# Patient Record
Sex: Female | Born: 1960 | Race: Black or African American | Hispanic: No | Marital: Single | Smoking: Current some day smoker
Health system: Southern US, Community
[De-identification: ages and names within clinical notes are randomized; demographics above are authoritative.]

## PROBLEM LIST (undated history)

## (undated) DIAGNOSIS — R0609 Other forms of dyspnea: Secondary | ICD-10-CM

## (undated) DIAGNOSIS — R0600 Dyspnea, unspecified: Secondary | ICD-10-CM

## (undated) DIAGNOSIS — I639 Cerebral infarction, unspecified: Secondary | ICD-10-CM

## (undated) DIAGNOSIS — J45909 Unspecified asthma, uncomplicated: Secondary | ICD-10-CM

## (undated) DIAGNOSIS — I10 Essential (primary) hypertension: Secondary | ICD-10-CM

## (undated) DIAGNOSIS — I2699 Other pulmonary embolism without acute cor pulmonale: Secondary | ICD-10-CM

## (undated) DIAGNOSIS — R600 Localized edema: Secondary | ICD-10-CM

## (undated) DIAGNOSIS — I2510 Atherosclerotic heart disease of native coronary artery without angina pectoris: Secondary | ICD-10-CM

## (undated) DIAGNOSIS — R569 Unspecified convulsions: Secondary | ICD-10-CM

## (undated) DIAGNOSIS — K760 Fatty (change of) liver, not elsewhere classified: Secondary | ICD-10-CM

## (undated) DIAGNOSIS — J9690 Respiratory failure, unspecified, unspecified whether with hypoxia or hypercapnia: Secondary | ICD-10-CM

## (undated) DIAGNOSIS — D509 Iron deficiency anemia, unspecified: Secondary | ICD-10-CM

## (undated) DIAGNOSIS — I5042 Chronic combined systolic (congestive) and diastolic (congestive) heart failure: Secondary | ICD-10-CM

## (undated) DIAGNOSIS — E119 Type 2 diabetes mellitus without complications: Secondary | ICD-10-CM

## (undated) HISTORY — PX: CORONARY ANGIOPLASTY WITH STENT PLACEMENT: SHX49

---
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
## 2012-05-31 DIAGNOSIS — I2699 Other pulmonary embolism without acute cor pulmonale: Secondary | ICD-10-CM

## 2012-05-31 HISTORY — DX: Other pulmonary embolism without acute cor pulmonale: I26.99

## 2012-10-15 DIAGNOSIS — I10 Essential (primary) hypertension: Secondary | ICD-10-CM | POA: Insufficient documentation

## 2012-10-15 DIAGNOSIS — R600 Localized edema: Secondary | ICD-10-CM | POA: Insufficient documentation

## 2012-10-15 DIAGNOSIS — J449 Chronic obstructive pulmonary disease, unspecified: Secondary | ICD-10-CM | POA: Insufficient documentation

## 2012-10-15 DIAGNOSIS — IMO0001 Reserved for inherently not codable concepts without codable children: Secondary | ICD-10-CM | POA: Insufficient documentation

## 2012-10-15 DIAGNOSIS — I2699 Other pulmonary embolism without acute cor pulmonale: Secondary | ICD-10-CM | POA: Insufficient documentation

## 2012-11-27 ENCOUNTER — Observation Stay (HOSPITAL_COMMUNITY): Payer: Medicaid Other

## 2012-11-27 ENCOUNTER — Emergency Department (HOSPITAL_COMMUNITY): Payer: Medicaid Other

## 2012-11-27 ENCOUNTER — Encounter (HOSPITAL_COMMUNITY): Payer: Self-pay | Admitting: *Deleted

## 2012-11-27 ENCOUNTER — Inpatient Hospital Stay (HOSPITAL_COMMUNITY)
Admission: EM | Admit: 2012-11-27 | Discharge: 2012-11-28 | DRG: 190 | Disposition: A | Discharge status: Home / Self Care | Payer: Medicaid Other | Attending: Family Medicine | Admitting: Family Medicine

## 2012-11-27 DIAGNOSIS — E7800 Pure hypercholesterolemia, unspecified: Secondary | ICD-10-CM | POA: Diagnosis present

## 2012-11-27 DIAGNOSIS — R0989 Other specified symptoms and signs involving the circulatory and respiratory systems: Secondary | ICD-10-CM

## 2012-11-27 DIAGNOSIS — R0600 Dyspnea, unspecified: Secondary | ICD-10-CM

## 2012-11-27 DIAGNOSIS — Z9861 Coronary angioplasty status: Secondary | ICD-10-CM

## 2012-11-27 DIAGNOSIS — Z886 Allergy status to analgesic agent status: Secondary | ICD-10-CM

## 2012-11-27 DIAGNOSIS — Z86711 Personal history of pulmonary embolism: Secondary | ICD-10-CM

## 2012-11-27 DIAGNOSIS — Z79899 Other long term (current) drug therapy: Secondary | ICD-10-CM

## 2012-11-27 DIAGNOSIS — J45901 Unspecified asthma with (acute) exacerbation: Principal | ICD-10-CM | POA: Diagnosis present

## 2012-11-27 DIAGNOSIS — Z6841 Body Mass Index (BMI) 40.0 and over, adult: Secondary | ICD-10-CM

## 2012-11-27 DIAGNOSIS — J189 Pneumonia, unspecified organism: Secondary | ICD-10-CM

## 2012-11-27 DIAGNOSIS — Z7901 Long term (current) use of anticoagulants: Secondary | ICD-10-CM

## 2012-11-27 DIAGNOSIS — I509 Heart failure, unspecified: Secondary | ICD-10-CM | POA: Diagnosis present

## 2012-11-27 DIAGNOSIS — I5023 Acute on chronic systolic (congestive) heart failure: Secondary | ICD-10-CM | POA: Diagnosis present

## 2012-11-27 DIAGNOSIS — I2510 Atherosclerotic heart disease of native coronary artery without angina pectoris: Secondary | ICD-10-CM | POA: Diagnosis present

## 2012-11-27 DIAGNOSIS — Z794 Long term (current) use of insulin: Secondary | ICD-10-CM

## 2012-11-27 DIAGNOSIS — F17200 Nicotine dependence, unspecified, uncomplicated: Secondary | ICD-10-CM | POA: Diagnosis present

## 2012-11-27 DIAGNOSIS — Z7902 Long term (current) use of antithrombotics/antiplatelets: Secondary | ICD-10-CM

## 2012-11-27 DIAGNOSIS — J441 Chronic obstructive pulmonary disease with (acute) exacerbation: Principal | ICD-10-CM | POA: Diagnosis present

## 2012-11-27 DIAGNOSIS — R0609 Other forms of dyspnea: Secondary | ICD-10-CM

## 2012-11-27 DIAGNOSIS — J449 Chronic obstructive pulmonary disease, unspecified: Secondary | ICD-10-CM

## 2012-11-27 DIAGNOSIS — E119 Type 2 diabetes mellitus without complications: Secondary | ICD-10-CM | POA: Diagnosis present

## 2012-11-27 DIAGNOSIS — I10 Essential (primary) hypertension: Secondary | ICD-10-CM | POA: Diagnosis present

## 2012-11-27 HISTORY — DX: Type 2 diabetes mellitus without complications: E11.9

## 2012-11-27 HISTORY — DX: Essential (primary) hypertension: I10

## 2012-11-27 HISTORY — DX: Unspecified asthma, uncomplicated: J45.909

## 2012-11-27 LAB — BASIC METABOLIC PANEL
BUN: 10 mg/dL (ref 6–23)
CO2: 27 mEq/L (ref 19–32)
Chloride: 102 mEq/L (ref 96–112)
GFR calc Af Amer: >90 mL/min (ref >90)
GFR calc non Af Amer: 83 mL/min — ABNORMAL LOW (ref >90)
Glucose, Bld: 139 mg/dL — ABNORMAL HIGH (ref 70–99)
Sodium: 136 mEq/L (ref 135–145)

## 2012-11-27 LAB — GLUCOSE, CAPILLARY
Glucose-Capillary: 219 mg/dL — ABNORMAL HIGH (ref 70–99)
Glucose-Capillary: 309 mg/dL — ABNORMAL HIGH (ref 70–99)

## 2012-11-27 LAB — TROPONIN I
Troponin I: <0.3 ng/mL (ref <0.30)
Troponin I: <0.3 ng/mL (ref <0.30)

## 2012-11-27 LAB — PRO B NATRIURETIC PEPTIDE: Pro B Natriuretic peptide (BNP): 1439 pg/mL — ABNORMAL HIGH (ref 0–125)

## 2012-11-27 LAB — CBC WITH DIFFERENTIAL/PLATELET
Basophils Absolute: 0.1 10*3/uL (ref 0.0–0.1)
Basophils Relative: 1 % (ref 0–1)
Hemoglobin: 10.9 g/dL — ABNORMAL LOW (ref 12.0–15.0)
Lymphocytes Relative: 27 % (ref 12–46)
Lymphs Abs: 3.2 10*3/uL (ref 0.7–4.0)
MCHC: 32.3 g/dL (ref 30.0–36.0)
Monocytes Relative: 9 % (ref 3–12)
Neutro Abs: 7.3 10*3/uL (ref 1.7–7.7)
Neutrophils Relative %: 63 % (ref 43–77)
Platelets: 317 10*3/uL (ref 150–400)
RBC: 4.25 MIL/uL (ref 3.87–5.11)

## 2012-11-27 MED ORDER — VANCOMYCIN HCL IN DEXTROSE 1-5 GM/200ML-% IV SOLN
1000.0000 mg | Freq: Once | INTRAVENOUS | Status: DC
  Filled 2012-11-27: qty 200

## 2012-11-27 MED ORDER — PIPERACILLIN-TAZOBACTAM 3.375 G IVPB
3.3750 g | Freq: Once | INTRAVENOUS | Status: DC
  Filled 2012-11-27: qty 50

## 2012-11-27 MED ORDER — RISPERIDONE 2 MG PO TABS
4.0000 mg | ORAL_TABLET | Freq: Two times a day (BID) | ORAL | Status: DC
  Administered 2012-11-27 – 2012-11-28 (×3): 4 mg via ORAL
  Filled 2012-11-27 (×4): qty 2

## 2012-11-27 MED ORDER — MONTELUKAST SODIUM 10 MG PO TABS
10.0000 mg | ORAL_TABLET | Freq: Every day | ORAL | Status: DC
  Administered 2012-11-27: 10 mg via ORAL
  Filled 2012-11-27 (×2): qty 1

## 2012-11-27 MED ORDER — SODIUM CHLORIDE 0.9 % IJ SOLN
3.0000 mL | Freq: Two times a day (BID) | INTRAMUSCULAR | Status: DC
  Administered 2012-11-27 (×2): 3 mL via INTRAVENOUS

## 2012-11-27 MED ORDER — PREDNISONE 20 MG PO TABS
60.0000 mg | ORAL_TABLET | Freq: Once | ORAL | Status: AC
  Administered 2012-11-27: 60 mg via ORAL
  Filled 2012-11-27: qty 3

## 2012-11-27 MED ORDER — FUROSEMIDE 10 MG/ML IJ SOLN
60.0000 mg | Freq: Two times a day (BID) | INTRAMUSCULAR | Status: DC
  Administered 2012-11-27: 60 mg via INTRAVENOUS
  Filled 2012-11-27 (×3): qty 6

## 2012-11-27 MED ORDER — PREDNISONE 50 MG PO TABS
50.0000 mg | ORAL_TABLET | Freq: Every day | ORAL | Status: DC
  Administered 2012-11-28: 50 mg via ORAL
  Filled 2012-11-27 (×2): qty 1

## 2012-11-27 MED ORDER — FLUTICASONE PROPIONATE 50 MCG/ACT NA SUSP
2.0000 | Freq: Every day | NASAL | Status: DC
  Administered 2012-11-27: 2 via NASAL
  Filled 2012-11-27: qty 16

## 2012-11-27 MED ORDER — IPRATROPIUM BROMIDE 0.02 % IN SOLN
0.5000 mg | Freq: Four times a day (QID) | RESPIRATORY_TRACT | Status: DC | PRN
  Administered 2012-11-27 – 2012-11-28 (×2): 0.5 mg via RESPIRATORY_TRACT
  Filled 2012-11-27 (×2): qty 2.5

## 2012-11-27 MED ORDER — TRAZODONE HCL 100 MG PO TABS
100.0000 mg | ORAL_TABLET | Freq: Every day | ORAL | Status: DC
  Filled 2012-11-27 (×2): qty 1

## 2012-11-27 MED ORDER — ALBUTEROL SULFATE (5 MG/ML) 0.5% IN NEBU
2.5000 mg | INHALATION_SOLUTION | RESPIRATORY_TRACT | Status: DC | PRN
  Administered 2012-11-27 – 2012-11-28 (×3): 2.5 mg via RESPIRATORY_TRACT
  Filled 2012-11-27 (×2): qty 0.5

## 2012-11-27 MED ORDER — SODIUM CHLORIDE 0.9 % IJ SOLN: 3.0000 mL | INTRAMUSCULAR | Status: DC | PRN

## 2012-11-27 MED ORDER — RIVAROXABAN 20 MG PO TABS
20.0000 mg | ORAL_TABLET | Freq: Every day | ORAL | Status: DC
  Administered 2012-11-27: 20 mg via ORAL
  Filled 2012-11-27 (×2): qty 1

## 2012-11-27 MED ORDER — TIOTROPIUM BROMIDE MONOHYDRATE 18 MCG IN CAPS
18.0000 ug | ORAL_CAPSULE | Freq: Every day | RESPIRATORY_TRACT | Status: DC
  Administered 2012-11-27 – 2012-11-28 (×2): 18 ug via RESPIRATORY_TRACT
  Filled 2012-11-27: qty 5

## 2012-11-27 MED ORDER — INSULIN ASPART 100 UNIT/ML ~~LOC~~ SOLN
0.0000 [IU] | Freq: Three times a day (TID) | SUBCUTANEOUS | Status: DC
  Administered 2012-11-27: 7 [IU] via SUBCUTANEOUS
  Administered 2012-11-27: 20 [IU] via SUBCUTANEOUS
  Administered 2012-11-27: 7 [IU] via SUBCUTANEOUS
  Administered 2012-11-28 (×2): 11 [IU] via SUBCUTANEOUS

## 2012-11-27 MED ORDER — IPRATROPIUM BROMIDE 0.02 % IN SOLN: 0.5000 mg | Freq: Four times a day (QID) | RESPIRATORY_TRACT | Status: DC | PRN

## 2012-11-27 MED ORDER — NICOTINE 7 MG/24HR TD PT24
7.0000 mg | MEDICATED_PATCH | Freq: Every day | TRANSDERMAL | Status: DC
  Administered 2012-11-27 – 2012-11-28 (×2): 7 mg via TRANSDERMAL
  Filled 2012-11-27 (×2): qty 1

## 2012-11-27 MED ORDER — CLOPIDOGREL BISULFATE 75 MG PO TABS
75.0000 mg | ORAL_TABLET | Freq: Every day | ORAL | Status: DC
  Administered 2012-11-27 – 2012-11-28 (×2): 75 mg via ORAL
  Filled 2012-11-27 (×2): qty 1

## 2012-11-27 MED ORDER — FUROSEMIDE 10 MG/ML IJ SOLN
60.0000 mg | Freq: Once | INTRAMUSCULAR | Status: AC
  Administered 2012-11-27: 60 mg via INTRAVENOUS
  Filled 2012-11-27: qty 6

## 2012-11-27 MED ORDER — DOXYCYCLINE HYCLATE 100 MG PO TABS
100.0000 mg | ORAL_TABLET | Freq: Two times a day (BID) | ORAL | Status: DC
  Administered 2012-11-27 – 2012-11-28 (×4): 100 mg via ORAL
  Filled 2012-11-27 (×5): qty 1

## 2012-11-27 MED ORDER — PANTOPRAZOLE SODIUM 40 MG PO TBEC
80.0000 mg | DELAYED_RELEASE_TABLET | Freq: Every day | ORAL | Status: DC
  Administered 2012-11-27 – 2012-11-28 (×2): 80 mg via ORAL
  Filled 2012-11-27 (×2): qty 2

## 2012-11-27 MED ORDER — ALBUTEROL (5 MG/ML) CONTINUOUS INHALATION SOLN
10.0000 mg/h | INHALATION_SOLUTION | RESPIRATORY_TRACT | Status: DC
  Administered 2012-11-27: 10 mg/h via RESPIRATORY_TRACT

## 2012-11-27 MED ORDER — ASPIRIN EC 81 MG PO TBEC
81.0000 mg | DELAYED_RELEASE_TABLET | Freq: Every day | ORAL | Status: DC
  Administered 2012-11-27: 81 mg via ORAL
  Filled 2012-11-27: qty 1

## 2012-11-27 MED ORDER — IPRATROPIUM BROMIDE 0.02 % IN SOLN
0.5000 mg | Freq: Once | RESPIRATORY_TRACT | Status: AC
  Administered 2012-11-27: 0.5 mg via RESPIRATORY_TRACT
  Filled 2012-11-27: qty 2.5

## 2012-11-27 MED ORDER — ALBUTEROL SULFATE HFA 108 (90 BASE) MCG/ACT IN AERS
1.0000 | INHALATION_SPRAY | Freq: Four times a day (QID) | RESPIRATORY_TRACT | Status: DC | PRN
  Administered 2012-11-27: 2 via RESPIRATORY_TRACT
  Filled 2012-11-27: qty 6.7

## 2012-11-27 MED ORDER — OXYCODONE-ACETAMINOPHEN 5-325 MG PO TABS: 1.0000 | ORAL_TABLET | ORAL | Status: DC | PRN

## 2012-11-27 MED ORDER — ONDANSETRON HCL 4 MG/2ML IJ SOLN: 4.0000 mg | Freq: Four times a day (QID) | INTRAMUSCULAR | Status: DC | PRN

## 2012-11-27 MED ORDER — DIAZEPAM 5 MG PO TABS: 5.0000 mg | ORAL_TABLET | Freq: Four times a day (QID) | ORAL | Status: DC | PRN

## 2012-11-27 MED ORDER — ACETAMINOPHEN 325 MG PO TABS: 650.0000 mg | ORAL_TABLET | ORAL | Status: DC | PRN

## 2012-11-27 MED ORDER — INSULIN GLARGINE 100 UNIT/ML ~~LOC~~ SOLN
50.0000 [IU] | Freq: Every day | SUBCUTANEOUS | Status: DC
  Administered 2012-11-27: 50 [IU] via SUBCUTANEOUS
  Filled 2012-11-27 (×2): qty 0.5

## 2012-11-27 MED ORDER — LISINOPRIL 20 MG PO TABS
20.0000 mg | ORAL_TABLET | Freq: Every day | ORAL | Status: DC
  Administered 2012-11-27 – 2012-11-28 (×2): 20 mg via ORAL
  Filled 2012-11-27 (×2): qty 1

## 2012-11-27 MED ORDER — CARVEDILOL 25 MG PO TABS
25.0000 mg | ORAL_TABLET | Freq: Two times a day (BID) | ORAL | Status: DC
  Administered 2012-11-27 – 2012-11-28 (×3): 25 mg via ORAL
  Filled 2012-11-27 (×5): qty 1

## 2012-11-27 MED ORDER — OXYCODONE-ACETAMINOPHEN 10-325 MG PO TABS: 1.0000 | ORAL_TABLET | ORAL | Status: DC | PRN

## 2012-11-27 MED ORDER — SIMVASTATIN 20 MG PO TABS
20.0000 mg | ORAL_TABLET | Freq: Every day | ORAL | Status: DC
  Administered 2012-11-27: 20 mg via ORAL
  Filled 2012-11-27 (×2): qty 1

## 2012-11-27 MED ORDER — SODIUM CHLORIDE 0.9 % IV SOLN: 250.0000 mL | INTRAVENOUS | Status: DC | PRN

## 2012-11-27 MED ORDER — ALBUTEROL SULFATE (5 MG/ML) 0.5% IN NEBU
INHALATION_SOLUTION | RESPIRATORY_TRACT | Status: AC
  Filled 2012-11-27: qty 2

## 2012-11-27 MED ORDER — ALBUTEROL SULFATE (5 MG/ML) 0.5% IN NEBU
5.0000 mg | INHALATION_SOLUTION | Freq: Once | RESPIRATORY_TRACT | Status: AC
  Administered 2012-11-27: 5 mg via RESPIRATORY_TRACT
  Filled 2012-11-27: qty 1

## 2012-11-27 MED ORDER — INSULIN GLARGINE 100 UNIT/ML ~~LOC~~ SOLN
40.0000 [IU] | Freq: Every day | SUBCUTANEOUS | Status: DC
  Filled 2012-11-27: qty 0.4

## 2012-11-27 MED ORDER — RIVAROXABAN 15 MG PO TABS: 15.0000 mg | ORAL_TABLET | Freq: Two times a day (BID) | ORAL | Status: DC

## 2012-11-27 MED ORDER — OXYCODONE HCL 5 MG PO TABS: 5.0000 mg | ORAL_TABLET | ORAL | Status: DC | PRN

## 2012-11-27 NOTE — ED Provider Notes (Addendum)
History    CSN: [id number] Arrival date & time 11/27/12  0210  First MD Initiated Contact with Patient 11/27/12 [phone]     Chief Complaint  Patient presents with  . Shortness of Breath  . Wheezing   HPI Patient presents with 24-36 hours of shortness of breath and wheezing.  The patient has a history of COPD.  She tried some of her breathing treatments at home without improvement in her symptoms.  Patient reports she feels much better after receiving breathing treatments by EMS.  She does have a history of pulmonary embolism and is currently on xarelto.  She also has a history of congestive heart failure and coronary artery disease.  Her symptoms are mild to moderate in severity.  She denies orthopnea.  She does report some dyspnea on exertion.  No chest pain at this time.  Nothing else worsens her pain.  Nothing improves her pain   Past Medical History  Diagnosis Date  . Diabetes mellitus without complication   . PE (pulmonary embolism)   . Hypercholesterolemia   . Hypertension   . COPD (chronic obstructive pulmonary disease)   . Asthma   . Coronary artery disease   . CHF (congestive heart failure)    Past Surgical History  Procedure Laterality Date  . Coronary angioplasty with stent placement     No family history on file. History  Substance Use Topics  . Smoking status: Current Every Day Smoker    Types: Cigarettes  . Smokeless tobacco: Not on file  . Alcohol Use: No   OB History   Grav Para Term Preterm Abortions TAB SAB Ect Mult Living                 Review of Systems  All other systems reviewed and are negative.    Allergies  Nsaids  Home Medications  No current outpatient prescriptions on file. BP 130/71  Pulse 91  Temp(Src) 99.8 F (37.7 C) (Axillary)  Resp 20  SpO2 97% Physical Exam  Nursing note and vitals reviewed. Constitutional: She is oriented to person, place, and time. She appears well-developed and well-nourished. No distress.  HENT:   Head: Normocephalic and atraumatic.  Eyes: EOM are normal.  Neck: Normal range of motion.  Cardiovascular: Normal rate, regular rhythm and normal heart sounds.   Pulmonary/Chest: Effort normal. She has wheezes.  Abdominal: Soft. She exhibits no distension. There is no tenderness.  Musculoskeletal: Normal range of motion. She exhibits no edema and no tenderness.  Neurological: She is alert and oriented to person, place, and time.  Skin: Skin is warm and dry.  Psychiatric: She has a normal mood and affect. Judgment normal.    ED Course  Procedures (including critical care time) Labs Reviewed  BASIC METABOLIC PANEL - Abnormal; Notable for the following:    Glucose, Bld 139 (*)    GFR calc non Af Amer 83 (*)    All other components within normal limits  CBC WITH DIFFERENTIAL - Abnormal; Notable for the following:    WBC 11.5 (*)    Hemoglobin 10.9 (*)    HCT 33.7 (*)    MCH 25.6 (*)    RDW 16.4 (*)    All other components within normal limits  PRO B NATRIURETIC PEPTIDE - Abnormal; Notable for the following:    Pro B Natriuretic peptide (BNP) 1439.0 (*)    All other components within normal limits  TROPONIN I   Dg Chest 2 View (if Patient Has Fever  And/or Copd)  11/27/2012   *RADIOLOGY REPORT*  Clinical Data: Short of breath.  Wheezing.  CHEST - 2 VIEW  Comparison: None.  Findings: The cardiopericardial silhouette is mildly enlarged. Thickening of the fissures is present with fluid in the right minor fissure.  Lung volumes are low.  Small bilateral pleural effusions are present.  Diffuse interstitial and airspace opacity is present. Findings may be associated with multifocal pneumonia or more likely, CHF.  A combination of both may be present.  Trachea midline.  IMPRESSION: Cardiomegaly and interstitial and airspace opacity with small pleural effusions.  This could be associated with multifocal pneumonia or more likely, mild to moderate CHF.   Original Report Authenticated By: Dereck Ligas, M.D.   I personally reviewed the imaging tests through PACS system I reviewed available ER/hospitalization records through the EMR   No diagnosis found.  MDM  Suspect COPD exacerbation. Prednisone and steroids now. Labs and cxr pending  4:09 AM The patient's breathing is not significantly changed the treatments here.  Oral temperature is 99.8.  This may represent healthcare associated pneumonia.  Vancomycin and Zosyn given.  Blood cultures be obtained.  The patient also will benefit from IV Lasix.  Patient be admitted the hospital for ongoing treatment and evaluation  Hoy Morn, MD 11/27/12 0410  Hoy Morn, MD 11/27/12 [phone]

## 2012-11-27 NOTE — ED Notes (Signed)
Dr. Alcario Drought in to see pt, at [facility] speaking with pt.

## 2012-11-27 NOTE — Clinical Documentation Improvement (Signed)
THIS DOCUMENT IS NOT A PERMANENT PART OF THE MEDICAL RECORD  Please update your documentation with the medical record to reflect your response to this query. If you need help knowing how to do this please call [phone].  11/27/12  Dear Dr. Verlon Au:  In a better effort to capture your patient's severity of illness, reflect appropriate length of stay and utilization of resources, a review of the patient medical record has revealed the following indicators the diagnosis of Heart Failure.    Please specify the type of CHF in your progress notes:  1. TYPE:  Combined systolic and diastolic  Diastolic  Systolic  Other (please specify)  2. CAUSE:  Hypertensive  Other cause (please specify)    Reviewed:  no additional documentation provided  I cannot answer.  Results were not available to me   Thank You,  Ezekiel Ina RN BSN Clinical Documentation Specialist: [place]

## 2012-11-27 NOTE — Progress Notes (Signed)
11:38 AM  I agree with HPI/GPe and A/P per Dr. Alcario Drought   52 yr old AAF Female with recent PE diagnosed 10/21/12 at [facility] and recent PE. Was hospitalized @ [facility] in [place]  fior about 5/18-5/24.  SHe was hospitlaized in the Reg floor She was feeling more OOB than usual and figured something was wring.  This was over 6/29-6/30 and then she came to the [facility] ed to see what was hapnneing and she tried to hold out but eventually came in.  She noted some swelling in LE's.   She was wheezing more than usyual.  She claims she has run otu of the albuterol pumps--she ran out of her regular meds about 4 days ago. SDtates that her Dorice Lamas has been sick recently qwith a cold.  SHe isn;t on abx  Claims Dr. Jeanie Cooks called them in to [facility] but she didn't have money to get them She thinks she might have had spirometry to diagnose COPD/Asthma   HEENT  CHEST  CARDIAC  ABDOMEN  NEURO  SKIN/MUSCULAR    Patient Active Problem List     Diagnosis  Date Noted    .  COPD exacerbation  11/27/2012    .  Chronic anticoagulation  11/27/2012    .  Acute exacerbation of CHF (congestive heart failure)  11/27/2012          Sounds like multifactorial dyspnea with AECOPD/?Asthma from not taking her Albuterol qid as she is accustomed.  She might need some help with meds.    Would continue the Doxi for 5 days, probably rpt a cxr in 1-2 days and reassess  vol status in am.  Have counselled her about 5 min about smoking.  Will likely need both anti-plt/anti-coag-but will hold asa 81. Increase lantus to 50 daily uin view of steroid burst prednisone 60 daily.   Verneita Griffes, MD [facility] [phone]

## 2012-11-27 NOTE — ED Notes (Signed)
No untoward changes, "feels better", calmer, breathing easier, declines NSL, blood drawn and sent to lab. Pt to xray at this time, family at [facility].

## 2012-11-27 NOTE — ED Notes (Signed)
EDP in to speak with pt, admission & plan of action discussed, family present, pt now agreeable to IV, IVT paged.

## 2012-11-27 NOTE — ED Notes (Signed)
IVT and lab paged.

## 2012-11-27 NOTE — ED Notes (Signed)
Report given to oncoming RN Charlynn Grimes., RN.

## 2012-11-27 NOTE — ED Notes (Signed)
RT at [facility], 1 hr CAT neb started, pt to transport to floor after neb complete at ~ 0630. Floor aware. Report called to [place], RN 3W.

## 2012-11-27 NOTE — ED Notes (Signed)
Returns from xray. Increased wob noted. Pt states, "exacerbated by movement in xray".

## 2012-11-27 NOTE — Progress Notes (Addendum)
Inpatient Diabetes Program Recommendations  AACE/ADA: New Consensus Statement on Inpatient Glycemic Control (2013)  Target Ranges:  Prepandial:   less than 140 mg/dL      Peak postprandial:   less than 180 mg/dL (1-2 hours)      Critically ill patients:  140 - 180 mg/dL   Reason for Visit:   Results for Tamara Silva, Tamara Silva (MRN [id number]) as of 11/27/2012 13:59  Ref. Range 11/27/2012 07:41 11/27/2012 11:38  Glucose-Capillary Latest Range: 70-99 mg/dL 219 (H) 383 (H)   Please add Novolog meal coverage 8 units tid with meals (Hold if patient eats less than 50%).  Also please check A1C to determine pre-hospitalization glycemic control. Will follow.

## 2012-11-27 NOTE — H&P (Signed)
[facility] History and Physical  Tamara Silva [id number] DOB: [birth year] DOA: 11/27/2012  Referring physician: ED PCP: No primary provider on file.   Chief Complaint: SOB, wheezing  HPI: Tamara Silva is a 52 y.o. female with PMH of COPD, CHF, CAD, who presents to the ED at [facility] with SOB.  Symptoms onset in the morning on Sunday, quality is wheezing, there is a cough productive of a greenish quality sputum.  The patient reports she quit smoking 2 days ago.  Her symptoms were improved by albuterol given by EMS.  Has h/o PE last month and is on Xarelto for this.  Review of Systems: No fever nor chills, 12 systems reviewed and otherwise negative.  Past Medical History  Diagnosis Date  . Diabetes mellitus without complication   . PE (pulmonary embolism)   . Hypercholesterolemia   . Hypertension   . COPD (chronic obstructive pulmonary disease)   . Asthma   . Coronary artery disease   . CHF (congestive heart failure)    Past Surgical History  Procedure Laterality Date  . Coronary angioplasty with stent placement     Social History:  reports that she has been smoking Cigarettes.  She has been smoking about 0.00 packs per day. She does not have any smokeless tobacco history on file. She reports that she does not drink alcohol or use illicit drugs.   Allergies  Allergen Reactions  . Nsaids     "I do not take NSAIDs d/t interfering with other meds"    No family history on file.  Prior to Admission medications   Medication Sig Start Date End Date Taking? Authorizing Provider  albuterol (PROVENTIL HFA;VENTOLIN HFA) 108 (90 BASE) MCG/ACT inhaler Inhale 2 puffs into the lungs every 6 (six) hours as needed for wheezing.   Yes Historical Provider, MD  aspirin EC 81 MG tablet Take 81 mg by mouth daily.   Yes Historical Provider, MD  carvedilol (COREG) 25 MG tablet Take 25 mg by mouth 2 (two) times daily with a meal.   Yes Historical Provider, MD  clopidogrel (PLAVIX) 75 MG  tablet Take 75 mg by mouth daily.   Yes Historical Provider, MD  diazepam (VALIUM) 5 MG tablet Take 5 mg by mouth every 6 (six) hours as needed (back pain).   Yes Historical Provider, MD  fluticasone (FLONASE) 50 MCG/ACT nasal spray Place 2 sprays into the nose daily.   Yes Historical Provider, MD  furosemide (LASIX) 40 MG tablet Take 80 mg by mouth 2 (two) times daily.   Yes Historical Provider, MD  insulin glargine (LANTUS) 100 UNIT/ML injection Inject 50 Units into the skin at bedtime.   Yes Historical Provider, MD  insulin lispro (HUMALOG) 100 UNIT/ML injection Inject 15-20 Units into the skin 3 (three) times daily before meals.   Yes Historical Provider, MD  ipratropium-albuterol (DUONEB) 0.5-2.5 (3) MG/3ML SOLN Take 3 mLs by nebulization every 6 (six) hours as needed.   Yes Historical Provider, MD  lisinopril (PRINIVIL,ZESTRIL) 20 MG tablet Take 20 mg by mouth daily.   Yes Historical Provider, MD  montelukast (SINGULAIR) 10 MG tablet Take 10 mg by mouth at bedtime.   Yes Historical Provider, MD  omeprazole (PRILOSEC) 40 MG capsule Take 40 mg by mouth 2 (two) times daily.   Yes Historical Provider, MD  oxyCODONE-acetaminophen (PERCOCET) 10-325 MG per tablet Take 1 tablet by mouth every 4 (four) hours as needed for pain.   Yes Historical Provider, MD  potassium chloride (K-DUR) 10 MEQ tablet  Take 10 mEq by mouth 2 (two) times daily.   Yes Historical Provider, MD  pravastatin (PRAVACHOL) 40 MG tablet Take 40 mg by mouth daily.   Yes Historical Provider, MD  risperidone (RISPERDAL) 4 MG tablet Take 4 mg by mouth 2 (two) times daily.   Yes Historical Provider, MD  Rivaroxaban (XARELTO) 15 MG TABS tablet Take 15 mg by mouth 2 (two) times daily with a meal.   Yes Historical Provider, MD  tiotropium (SPIRIVA) 18 MCG inhalation capsule Place 18 mcg into inhaler and inhale daily.   Yes Historical Provider, MD  traZODone (DESYREL) 100 MG tablet Take 100 mg by mouth at bedtime.   Yes Historical Provider,  MD   Physical Exam: Filed Vitals:   11/27/12 0230 11/27/12 0245 11/27/12 0423 11/27/12 0425  BP: 133/64 130/116    Pulse: 89 89    Temp:   99.9 F (37.7 C)   TempSrc:   Oral   Resp:  26    SpO2: 96% 92%  88%    General:  NAD, resting comfortably in bed Eyes: PEERLA EOMI ENT: mucous membranes moist Neck: supple w/o JVD Cardiovascular: RRR w/o MRG Respiratory: sparse wheezes B Abdomen: soft, nt, nd, bs+ Skin: no rash nor lesion Musculoskeletal: MAE, full ROM all 4 extremities Psychiatric: normal tone and affect Neurologic: AAOx3, grossly non-focal  Labs on Admission:  Basic Metabolic Panel:  Recent Labs Lab 11/27/12 0311  NA 136  K 3.7  CL 102  CO2 27  GLUCOSE 139*  BUN 10  CREATININE 0.80  CALCIUM 8.7   Liver Function Tests: No results found for this basename: AST, ALT, ALKPHOS, BILITOT, PROT, ALBUMIN,  in the last 168 hours No results found for this basename: LIPASE, AMYLASE,  in the last 168 hours No results found for this basename: AMMONIA,  in the last 168 hours CBC:  Recent Labs Lab 11/27/12 0311  WBC 11.5*  NEUTROABS 7.3  HGB 10.9*  HCT 33.7*  MCV 79.3  PLT 317   Cardiac Enzymes:  Recent Labs Lab 11/27/12 0311  TROPONINI <0.30    BNP (last 3 results)  Recent Labs  11/27/12 0311  PROBNP 1439.0*   CBG: No results found for this basename: GLUCAP,  in the last 168 hours  Radiological Exams on Admission: Dg Chest 2 View (if Patient Has Fever And/or Copd)  11/27/2012   *RADIOLOGY REPORT*  Clinical Data: Short of breath.  Wheezing.  CHEST - 2 VIEW  Comparison: None.  Findings: The cardiopericardial silhouette is mildly enlarged. Thickening of the fissures is present with fluid in the right minor fissure.  Lung volumes are low.  Small bilateral pleural effusions are present.  Diffuse interstitial and airspace opacity is present. Findings may be associated with multifocal pneumonia or more likely, CHF.  A combination of both may be present.   Trachea midline.  IMPRESSION: Cardiomegaly and interstitial and airspace opacity with small pleural effusions.  This could be associated with multifocal pneumonia or more likely, mild to moderate CHF.   Original Report Authenticated By: Dereck Ligas, M.D.    EKG: Independently reviewed.  Assessment/Plan Principal Problem:   COPD exacerbation Active Problems:   Chronic anticoagulation   Acute exacerbation of CHF (congestive heart failure)   1. Acute exacerbation of CHF - have patient on lasix, 2D echo pending, on CHF pathway. 2. COPD exacerbation - wheezing improved with breathing treatments and prednisone PO, continuing these and adding doxycycline for bronchitis symptoms, neb treatments per RRT recommendations.  Do not  believe that this patient has PNA at this point as she has only 1 SIRS criteria and is sating very well at the moment., watch for this and if worsening symptoms develop then start ABx during hospital stay. 3. Chronic anticoagulation - patient currently on ASA 81, plavix, and Xarelto, likely needs to establish with cardiologist in area, will continue these for now but did bring up my concerns about the increased risk of bleeding with taking these multiple anticoagulants all at once   Code Status: Full Code (must indicate code status--if unknown or must be presumed, indicate so) Family Communication: Spoke with family at bedside (indicate person spoken with, if applicable, with phone number if by telephone) Disposition Plan: Admit to obs (indicate anticipated LOS)  Time spent: 70 min  GARDNER, JARED M. [facility] Pager [phone]  If 7PM-7AM, please contact night-coverage [email or web address] Password [email or web address] 11/27/2012, 5:01 AM

## 2012-11-27 NOTE — ED Notes (Signed)
Per Dr. Alcario Drought: "OK to give doxy after 1st BC, 2nd BC not needed".

## 2012-11-27 NOTE — ED Notes (Addendum)
Here by [facility], here for sob & wheezing, onset Sunday morning, h/o COPD and asthma, took 3 of her own combivent nebs at home, EMS gave albuterol 10mg  and atrovent 0.5mg  PTA, neb in progress on arrival, family with pt, pt "feels better, breathing easier", wheezing remains, alert, NAD, calm, interactive, resps e/ mildly labored, increased wob, insp & exp wheezing present. Denies edema. Mentions subjective low fever. Fever noted.  None present. EMS unable to establish IV. Recent ly moved from [place], [place], has d/c paperwork from [place] in hand from 5/24. Also h/o PE, CAD with stents, some occluded. denies pain or other sx. Mentions constipation. Last BM today (scant/small). smokes ~ 5 cigarettes p/day. "Quit smoking 2d ago".

## 2012-11-27 NOTE — ED Notes (Signed)
Lab and IVT at [facility]. SPO2 down to 88% RA, placed on O2 [place] 4L, alert, NAD, resps mildly labored, tachypneic, insp & exp wheezing remains.

## 2012-11-27 NOTE — Care Management Note (Unsigned)
    Page 1 of 1   11/27/2012     11:46:41 AM   CARE MANAGEMENT NOTE 11/27/2012  Patient:  Tamara Silva   Account Number:  [id number]  Date Initiated:  11/27/2012  Documentation initiated by:  GRAVES-BIGELOW,Kanani Mowbray  Subjective/Objective Assessment:   Pt admitted d/t SOB, wheezing. Pt states she gets her medications from [facility] and [facility] for 3.00.     Action/Plan:   CM will monitor for disposition needs. Pt may need a scale for home. CM will see if any available to provide to pt. Pt states she will not need a HH RN at d/c to f/u. Will continue to monitor.   Anticipated DC Date:  11/30/2012   Anticipated DC Plan:  [place]  CM consult  Patient refused services      Choice offered to / List presented to:             Status of service:  In process, will continue to follow Medicare Important Message given?   (If response is "NO", the following Medicare IM given date fields will be blank) Date Medicare IM given:   Date Additional Medicare IM given:    Discharge Disposition:    Per UR Regulation:  Reviewed for med. necessity/level of care/duration of stay  If discussed at [place] of Stay Meetings, dates discussed:    Comments:

## 2012-11-27 NOTE — ED Notes (Signed)
Pt up to [facility]

## 2012-11-28 DIAGNOSIS — I517 Cardiomegaly: Secondary | ICD-10-CM

## 2012-11-28 LAB — CBC
MCH: 25.8 pg — ABNORMAL LOW (ref 26.0–34.0)
MCHC: 32.1 g/dL (ref 30.0–36.0)
MCV: 80.4 fL (ref 78.0–100.0)
Platelets: 301 10*3/uL (ref 150–400)
RBC: 4.49 MIL/uL (ref 3.87–5.11)

## 2012-11-28 LAB — COMPREHENSIVE METABOLIC PANEL
ALT: 16 U/L (ref 0–35)
AST: 18 U/L (ref 0–37)
CO2: 27 mEq/L (ref 19–32)
Calcium: 8.8 mg/dL (ref 8.4–10.5)
Chloride: 99 mEq/L (ref 96–112)
Creatinine, Ser: 0.83 mg/dL (ref 0.50–1.10)
GFR calc Af Amer: >90 mL/min (ref >90)
GFR calc non Af Amer: 79 mL/min — ABNORMAL LOW (ref >90)
Glucose, Bld: 291 mg/dL — ABNORMAL HIGH (ref 70–99)
Sodium: 136 mEq/L (ref 135–145)
Total Protein: 7.7 g/dL (ref 6.0–8.3)

## 2012-11-28 LAB — GLUCOSE, CAPILLARY
Glucose-Capillary: 288 mg/dL — ABNORMAL HIGH (ref 70–99)
Glucose-Capillary: 281 mg/dL — ABNORMAL HIGH (ref 70–99)

## 2012-11-28 MED ORDER — ALBUTEROL SULFATE HFA 108 (90 BASE) MCG/ACT IN AERS
2.0000 | INHALATION_SPRAY | RESPIRATORY_TRACT | Status: DC | PRN
  Filled 2012-11-28: qty 6.7

## 2012-11-28 MED ORDER — MAGNESIUM HYDROXIDE 400 MG/5ML PO SUSP
30.0000 mL | Freq: Every day | ORAL | Status: DC | PRN
  Administered 2012-11-28: 30 mL via ORAL
  Filled 2012-11-28: qty 30

## 2012-11-28 MED ORDER — FUROSEMIDE 10 MG/ML IJ SOLN
INTRAMUSCULAR | Status: AC
  Administered 2012-11-28: 60 mg via INTRAVENOUS
  Filled 2012-11-28: qty 8

## 2012-11-28 MED ORDER — FUROSEMIDE 10 MG/ML IJ SOLN
INTRAMUSCULAR | Status: AC
  Filled 2012-11-28: qty 4

## 2012-11-28 MED ORDER — NICOTINE 7 MG/24HR TD PT24: 1.0000 | MEDICATED_PATCH | Freq: Every day | TRANSDERMAL | Status: DC

## 2012-11-28 MED ORDER — RIVAROXABAN 20 MG PO TABS: 20.0000 mg | ORAL_TABLET | Freq: Every day | ORAL | Status: DC

## 2012-11-28 MED ORDER — INSULIN GLARGINE 100 UNIT/ML ~~LOC~~ SOLN: 60.0000 [IU] | Freq: Every day | SUBCUTANEOUS | Status: DC

## 2012-11-28 MED ORDER — ALBUTEROL SULFATE (5 MG/ML) 0.5% IN NEBU
2.5000 mg | INHALATION_SOLUTION | Freq: Two times a day (BID) | RESPIRATORY_TRACT | Status: DC
Start: 2012-11-28 — End: 2012-11-28

## 2012-11-28 MED ORDER — PREDNISONE 50 MG PO TABS: ORAL_TABLET | ORAL | Status: DC

## 2012-11-28 NOTE — Progress Notes (Signed)
  Echocardiogram 2D Echocardiogram has been performed.  Sharian Delia 11/28/2012, 11:10 AM

## 2012-11-28 NOTE — Discharge Summary (Signed)
Physician Discharge Summary  Tamara Silva [id number] DOB: [birth year] DOA: 11/27/2012  PCP: Philis Fendt, MD  Admit date: 11/27/2012 Discharge date: 11/28/2012  Time spent: 40 minutes  Recommendations for Outpatient Follow-up:  1. Continue prednisone 50 mg for 3 days. 2. Needs BMET, BNP and close outpatient followup of volume status-recommend establishing with cardiologist and I will give her name. 3. Please followup echocardiogram from 11/28/2012 that is still pending 4. Need close monitoring of blood sugar  Discharge Diagnoses:  Principal Problem:   COPD exacerbation Active Problems:   Chronic anticoagulation   Acute exacerbation of CHF (congestive heart failure)   Discharge Condition: Stable  Diet recommendation: Heart healthy low-salt diabetic  Filed Weights   11/27/12 0647 11/28/12 0548  Weight: 134.718 kg (297 lb) 134.4 kg (296 lb 4.8 oz)    History of present illness:  52 year old obese Caucasian female, known pulmonary embolism diagnosed 5/24, history CAD, stents placed 2006, 2009 [do not have records], COPD history, still smoker, morbid obesity, hypertensive and diabetic admitted 11/27/2012 with dyspnea Dyspneic at multifactorial, chest x-ray suggestive more of CHF and a AECOPD-nonetheless she had no chest pain and not cardiac enzymes elevation although proBNP was 1439. She was diuresed relatively aggressively with Lasix 60 twice a day IV transitioned back to her home dosage. She was given a burst of prednisone 50 mg in hospital but she'll continue 3 more days. Doxycycline was started was discontinued given the feeling that this was more in keeping with multifactorial dyspnea and lack of need for the same. She felt stable and ready to be discharged on 11/28/2012 and was ambulated. Blood sugars were in the high 200s low 300s and advised her to increase her Lantus to 60 units and monitor blood sugars daily. She should also get daily weights at home-agent appears savvy  enough to be able to titrate her insulin She is recently new to the area and have given her the name of cardiologist followup within one to 2 weeks to reevaluate her medications and other multiple issues.   Discharge Exam: Filed Vitals:   11/28/12 0548 11/28/12 0831 11/28/12 0945 11/28/12 0953  BP: 117/83 122/62  114/67  Pulse: 63   75  Temp: 97.5 F (36.4 C)     TempSrc: Oral     Resp: 18     Height:      Weight: 134.4 kg (296 lb 4.8 oz)     SpO2: 100%  97%    ALert pleasant smiling, ready to go home. No chest pain no shortness breath no nausea vomiting  General: Alert pleasant smiling Morbid obesity, Body mass index is 46.4 kg/(m^2). Cardiovascular: S1-S2 no murmur rub or gallop Respiratory: Clear  Discharge Instructions  Discharge Orders   Future Appointments Provider Department Dept Phone   12/14/2012 8:30 AM Lorretta Harp, MD [place] [phone]   Future Orders Complete By Expires     Diet - low sodium heart healthy  As directed     Discharge instructions  As directed     Comments:      Stop taking Aspirin-continue Xarelto/Plavix-See a cardiologist. You might need to increase your lantus to 60 or 65 units daily for 2-3 days-make sure you check your blood sugars to determine when you can go back to your regular doses Continue Prednisone 50 daily for 2-3 days-I do not think you need the Doxycycline Please please try to exercise outside while it is warm-Obesity can be lifethreatening Do your best to quit smoking  Increase activity slowly  As directed         Medication List    STOP taking these medications       aspirin EC 81 MG tablet     montelukast 10 MG tablet  Commonly known as:  SINGULAIR     potassium chloride 10 MEQ tablet  Commonly known as:  K-DUR      TAKE these medications       albuterol 108 (90 BASE) MCG/ACT inhaler  Commonly known as:  PROVENTIL HFA;VENTOLIN HFA  Inhale 2 puffs into the lungs  every 6 (six) hours as needed for wheezing.     carvedilol 25 MG tablet  Commonly known as:  COREG  Take 25 mg by mouth 2 (two) times daily with a meal.     clopidogrel 75 MG tablet  Commonly known as:  PLAVIX  Take 75 mg by mouth daily.     diazepam 5 MG tablet  Commonly known as:  VALIUM  Take 5 mg by mouth every 6 (six) hours as needed (back pain).     fluticasone 50 MCG/ACT nasal spray  Commonly known as:  FLONASE  Place 2 sprays into the nose daily.     furosemide 40 MG tablet  Commonly known as:  LASIX  Take 80 mg by mouth 2 (two) times daily.     insulin glargine 100 UNIT/ML injection  Commonly known as:  LANTUS  Inject 0.6 mLs (60 Units total) into the skin at bedtime.     insulin lispro 100 UNIT/ML injection  Commonly known as:  HUMALOG  Inject 15-20 Units into the skin 3 (three) times daily before meals.     ipratropium-albuterol 0.5-2.5 (3) MG/3ML Soln  Commonly known as:  DUONEB  Take 3 mLs by nebulization every 6 (six) hours as needed.     lisinopril 20 MG tablet  Commonly known as:  PRINIVIL,ZESTRIL  Take 20 mg by mouth daily.     nicotine 7 mg/24hr patch  Commonly known as:  NICODERM CQ - dosed in mg/24 hr  Place 1 patch onto the skin daily.     omeprazole 40 MG capsule  Commonly known as:  PRILOSEC  Take 40 mg by mouth 2 (two) times daily.     oxyCODONE-acetaminophen 10-325 MG per tablet  Commonly known as:  PERCOCET  Take 1 tablet by mouth every 4 (four) hours as needed for pain.     pravastatin 40 MG tablet  Commonly known as:  PRAVACHOL  Take 40 mg by mouth daily.     risperidone 4 MG tablet  Commonly known as:  RISPERDAL  Take 4 mg by mouth 2 (two) times daily.     Rivaroxaban 20 MG Tabs  Commonly known as:  XARELTO  Take 1 tablet (20 mg total) by mouth daily with supper.     tiotropium 18 MCG inhalation capsule  Commonly known as:  SPIRIVA  Place 18 mcg into inhaler and inhale daily.     traZODone 100 MG tablet  Commonly known  as:  DESYREL  Take 100 mg by mouth at bedtime.       Allergies  Allergen Reactions  . Nsaids     "I do not take NSAIDs d/t interfering with other meds"      The results of significant diagnostics from this hospitalization (including imaging, microbiology, ancillary and laboratory) are listed below for reference.    Significant Diagnostic Studies: Dg Chest 2 View  11/27/2012   *RADIOLOGY REPORT*  Clinical Data: Shortness of breath.  Evaluate for pneumonia.  CHEST - 2 VIEW  Comparison: 11/27/2012  Findings: Heart size is upper normal in stable.  Mediastinal hilar contours are normal.  There is fluid or thickening in the minor fissure on the right scarring in the left lung and left lung base. Tiny bilateral effusions.  No change appreciated compared to chest radiograph performed earlier today.  IMPRESSION: Stable chest radiograph.  Mild interstitial prominence and tiny bilateral pleural effusions could be secondary to mild congestive heart failure.   Original Report Authenticated By: Curlene Dolphin, M.D.   Dg Chest 2 View (if Patient Has Fever And/or Copd)  11/27/2012   *RADIOLOGY REPORT*  Clinical Data: Short of breath.  Wheezing.  CHEST - 2 VIEW  Comparison: None.  Findings: The cardiopericardial silhouette is mildly enlarged. Thickening of the fissures is present with fluid in the right minor fissure.  Lung volumes are low.  Small bilateral pleural effusions are present.  Diffuse interstitial and airspace opacity is present. Findings may be associated with multifocal pneumonia or more likely, CHF.  A combination of both may be present.  Trachea midline.  IMPRESSION: Cardiomegaly and interstitial and airspace opacity with small pleural effusions.  This could be associated with multifocal pneumonia or more likely, mild to moderate CHF.   Original Report Authenticated By: Dereck Ligas, M.D.    Microbiology: Recent Results (from the past 240 hour(s))  CULTURE, BLOOD (ROUTINE X 2)     Status:  None   Collection Time    11/27/12  4:25 AM      Result Value Range Status   Specimen Description BLOOD RIGHT ARM   Final   Special Requests BOTTLES DRAWN AEROBIC ONLY 5CC   Final   Culture  Setup Time 11/27/2012 10:40   Final   Culture     Final   Value:        BLOOD CULTURE RECEIVED NO GROWTH TO DATE CULTURE WILL BE HELD FOR 5 DAYS BEFORE ISSUING A FINAL NEGATIVE REPORT   Report Status PENDING   Incomplete  CULTURE, BLOOD (ROUTINE X 2)     Status: None   Collection Time    11/27/12  4:25 AM      Result Value Range Status   Specimen Description BLOOD RIGHT ARM   Final   Special Requests BOTTLES DRAWN AEROBIC ONLY 5CC   Final   Culture  Setup Time 11/27/2012 10:40   Final   Culture     Final   Value:        BLOOD CULTURE RECEIVED NO GROWTH TO DATE CULTURE WILL BE HELD FOR 5 DAYS BEFORE ISSUING A FINAL NEGATIVE REPORT   Report Status PENDING   Incomplete     Labs: Basic Metabolic Panel:  Recent Labs Lab 11/27/12 0311 11/28/12 0725  NA 136 136  K 3.7 3.9  CL 102 99  CO2 27 27  GLUCOSE 139* 291*  BUN 10 19  CREATININE 0.80 0.83  CALCIUM 8.7 8.8   Liver Function Tests:  Recent Labs Lab 11/28/12 0725  AST 18  ALT 16  ALKPHOS 62  BILITOT 0.3  PROT 7.7  ALBUMIN 3.0*   No results found for this basename: LIPASE, AMYLASE,  in the last 168 hours No results found for this basename: AMMONIA,  in the last 168 hours CBC:  Recent Labs Lab 11/27/12 0311 11/28/12 0725  WBC 11.5* 10.3  NEUTROABS 7.3  --   HGB 10.9* 11.6*  HCT 33.7* 36.1  MCV 79.3 80.4  PLT 317 301   Cardiac Enzymes:  Recent Labs Lab 11/27/12 0311 11/27/12 0910 11/27/12 1504  TROPONINI <0.30 <0.30 <0.30   BNP: BNP (last 3 results)  Recent Labs  11/27/12 0311  PROBNP 1439.0*   CBG:  Recent Labs Lab 11/27/12 1138 11/27/12 1711 11/27/12 2104 11/28/12 0734 11/28/12 1141  GLUCAP 383* 249* 309* 288* 281*    Signed:  Nita Sells  [facility] 11/28/2012, 12:07  PM

## 2012-11-28 NOTE — Progress Notes (Addendum)
Resting o2 sats 97% RA; pt ambulated in hallway on RA, o2 stats sustained 89-91%

## 2012-11-28 NOTE — Progress Notes (Signed)
D/c orders received;IV removed with gauze on, pt remains in stable condition, pt meds and instructions reviewed and given to pt; pt d/c to home 

## 2012-12-03 LAB — CULTURE, BLOOD (ROUTINE X 2)
Culture: NO GROWTH
Culture: NO GROWTH

## 2012-12-14 ENCOUNTER — Ambulatory Visit: Payer: Medicaid Other | Admitting: Cardiovascular Disease

## 2013-01-19 ENCOUNTER — Other Ambulatory Visit: Payer: Self-pay | Admitting: Internal Medicine

## 2013-01-19 DIAGNOSIS — I2699 Other pulmonary embolism without acute cor pulmonale: Secondary | ICD-10-CM

## 2013-01-23 ENCOUNTER — Ambulatory Visit
Admission: RE | Admit: 2013-01-23 | Discharge: 2013-01-23 | Disposition: A | Discharge status: Home / Self Care | Payer: Medicaid Other | Source: Ambulatory Visit | Attending: Internal Medicine | Admitting: Internal Medicine

## 2013-01-23 DIAGNOSIS — I2699 Other pulmonary embolism without acute cor pulmonale: Secondary | ICD-10-CM

## 2013-01-23 MED ORDER — IOHEXOL 350 MG/ML SOLN
125.0000 mL | Freq: Once | INTRAVENOUS | Status: AC | PRN
  Administered 2013-01-23: 125 mL via INTRAVENOUS

## 2013-02-02 ENCOUNTER — Ambulatory Visit: Payer: Medicaid Other | Admitting: Cardiovascular Disease

## 2013-02-08 ENCOUNTER — Ambulatory Visit (INDEPENDENT_AMBULATORY_CARE_PROVIDER_SITE_OTHER): Payer: Medicaid Other | Admitting: Cardiovascular Disease

## 2013-02-08 ENCOUNTER — Encounter: Payer: Self-pay | Admitting: Cardiovascular Disease

## 2013-02-08 VITALS — BP 142/88 | HR 74 | Ht 67.5 in | Wt 295.0 lb

## 2013-02-08 DIAGNOSIS — F17200 Nicotine dependence, unspecified, uncomplicated: Secondary | ICD-10-CM

## 2013-02-08 DIAGNOSIS — F319 Bipolar disorder, unspecified: Secondary | ICD-10-CM

## 2013-02-08 DIAGNOSIS — F4310 Post-traumatic stress disorder, unspecified: Secondary | ICD-10-CM

## 2013-02-08 DIAGNOSIS — I2589 Other forms of chronic ischemic heart disease: Secondary | ICD-10-CM

## 2013-02-08 DIAGNOSIS — Z794 Long term (current) use of insulin: Secondary | ICD-10-CM

## 2013-02-08 DIAGNOSIS — Z720 Tobacco use: Secondary | ICD-10-CM

## 2013-02-08 DIAGNOSIS — F4311 Post-traumatic stress disorder, acute: Secondary | ICD-10-CM

## 2013-02-08 DIAGNOSIS — E785 Hyperlipidemia, unspecified: Secondary | ICD-10-CM

## 2013-02-08 DIAGNOSIS — I2782 Chronic pulmonary embolism: Secondary | ICD-10-CM

## 2013-02-08 DIAGNOSIS — E119 Type 2 diabetes mellitus without complications: Secondary | ICD-10-CM

## 2013-02-08 DIAGNOSIS — I255 Ischemic cardiomyopathy: Secondary | ICD-10-CM

## 2013-02-08 DIAGNOSIS — I10 Essential (primary) hypertension: Secondary | ICD-10-CM

## 2013-02-08 DIAGNOSIS — I2510 Atherosclerotic heart disease of native coronary artery without angina pectoris: Secondary | ICD-10-CM

## 2013-02-08 DIAGNOSIS — IMO0001 Reserved for inherently not codable concepts without codable children: Secondary | ICD-10-CM

## 2013-02-08 MED ORDER — NITROGLYCERIN 0.4 MG SL SUBL: 0.4000 mg | SUBLINGUAL_TABLET | SUBLINGUAL | Status: DC | PRN

## 2013-02-08 NOTE — Assessment & Plan Note (Signed)
On Xarelto anticoagulation

## 2013-02-08 NOTE — Assessment & Plan Note (Signed)
Controlled on current medications 

## 2013-02-08 NOTE — Patient Instructions (Addendum)
Your physician recommends that you schedule a follow-up appointment in: 6 months  

## 2013-02-08 NOTE — Assessment & Plan Note (Signed)
Patient has a history of multiple myocardial infarctions in the past. She had an myocardial infarction at [facility] in [facility] where she had 2 stents placed. 3 years later in 2009 she had a stent placed at [facility] in [place] after another myocardial infarction.a recent 2-D echo revealed an EF of 35% with wall motion abnormalities in the LAD territory.she does have dyspnea on exertion but denies chest pain. She is on Plavix and Xarelto for pulmonary embolism

## 2013-02-08 NOTE — Progress Notes (Signed)
02/08/2013 Tamara Silva   1960/06/30  [id number]  Primary Physician Tamara Fendt, MD Primary Cardiologist: Tamara Harp MD Tamara Silva   HPI:  Tamara Silva is a 52 year old moderately overweight divorced African American female mother of one, grandmother 2 grandchildren referred by Tamara Silva for cardiovascular evaluation.she has a history of ischemic heart disease status post myocardial infarction in 2006 status post stenting at [facility] with an MI 3 years later in 2009 and stenting at [facility]. She's had a pulmonary embolism in the past to both oral anticoagulation. Her other problems include continued tobacco abuse, 2 hypertension, diabetes and hyperlipidemia. She complains of dyspnea but denies chest pain.   Current Outpatient Prescriptions  Medication Sig Dispense Refill  . albuterol (PROVENTIL HFA;VENTOLIN HFA) 108 (90 BASE) MCG/ACT inhaler Inhale 2 puffs into the lungs every 6 (six) hours as needed for wheezing.      . carvedilol (COREG) 25 MG tablet Take 25 mg by mouth 2 (two) times daily with a meal.      . clopidogrel (PLAVIX) 75 MG tablet Take 75 mg by mouth daily.      . diazepam (VALIUM) 5 MG tablet Take 5 mg by mouth every 6 (six) hours as needed (back pain).      . fluticasone (FLONASE) 50 MCG/ACT nasal spray Place 2 sprays into the nose daily.      . furosemide (LASIX) 40 MG tablet Take 80 mg by mouth 2 (two) times daily.      . insulin glargine (LANTUS) 100 UNIT/ML injection Inject 55 Units into the skin at bedtime.      . insulin lispro (HUMALOG) 100 UNIT/ML injection Inject into the skin. Inject 15-20 Units subcutaneously 3 (three) times daily before meals.      [name] Kitchen ipratropium-albuterol (DUONEB) 0.5-2.5 (3) MG/3ML SOLN Take 3 mLs by nebulization every 6 (six) hours as needed.      [name] Kitchen lisinopril (PRINIVIL,ZESTRIL) 20 MG tablet Take 20 mg by mouth daily.      [name] Kitchen omeprazole (PRILOSEC) 40 MG capsule Take 40 mg by mouth 2 (two) times daily.      .  potassium chloride (K-DUR) 10 MEQ tablet Take 10 mEq by mouth daily. Take 1 tablet (10 mEq total) by mouth 2 (two) times daily.      . pravastatin (PRAVACHOL) 40 MG tablet Take 40 mg by mouth daily.      . Rivaroxaban (XARELTO) 20 MG TABS Take 1 tablet (20 mg total) by mouth daily with supper.  30 tablet  1  . tiotropium (SPIRIVA) 18 MCG inhalation capsule Place 18 mcg into inhaler and inhale as needed.       . traZODone (DESYREL) 100 MG tablet Take 100 mg by mouth at bedtime.       No current facility-administered medications for this visit.    Allergies  Allergen Reactions  . Nsaids     "I do not take NSAIDs d/t interfering with other meds"    History   Social History  . Marital Status: Single    Spouse Name: N/A    Number of Children: N/A  . Years of Education: N/A   Occupational History  . Not on file.   Social History Main Topics  . Smoking status: Former Smoker    Types: Cigarettes    Quit date: 11/24/2012  . Smokeless tobacco: Never Used  . Alcohol Use: No  . Drug Use: No  . Sexual Activity: Not on file   Other Topics Concern  .  Not on file   Social History Narrative   Origibnally from [place]   Most recently from [place]    Daughter lives in town      On Social security to CHF, COPD, CAD     Review of Systems: General: negative for chills, fever, night sweats or weight changes.  Cardiovascular: negative for chest pain, dyspnea on exertion, edema, orthopnea, palpitations, paroxysmal nocturnal dyspnea or shortness of breath Dermatological: negative for rash Respiratory: negative for cough or wheezing Urologic: negative for hematuria Abdominal: negative for nausea, vomiting, diarrhea, bright red blood per rectum, melena, or hematemesis Neurologic: negative for visual changes, syncope, or dizziness All other systems reviewed and are otherwise negative except as noted above.    Blood pressure 142/88, pulse 74, height 5' 7.5" (1.715 m), weight 295 lb  (133.811 kg).  General appearance: alert and no distress Neck: no adenopathy, no carotid bruit, no JVD, supple, symmetrical, trachea midline and thyroid not enlarged, symmetric, no tenderness/mass/nodules Lungs: clear to auscultation bilaterally Heart: regular rate and rhythm, S1, S2 normal, no murmur, click, rub or gallop Abdomen: soft, non-tender; bowel sounds normal; no masses,  no organomegaly and protuberant Extremities: extremities normal, atraumatic, no cyanosis or edema Pulses: 2+ and symmetric  EKG sinus rhythm at 74 with evidence of an old inferior and anterolateral myocardial infarction  ASSESSMENT AND PLAN:   Coronary artery disease Patient has a history of multiple myocardial infarctions in the past. She had an myocardial infarction at [facility] where she had 2 stents placed. 3 years later in 2009 she had a stent placed at [facility] in [place] after another myocardial infarction.a recent 2-D echo revealed an EF of 35% with wall motion abnormalities in the LAD territory.she does have dyspnea on exertion but denies chest pain. She is on Plavix and Xarelto for pulmonary embolism  Chronic pulmonary embolism On Xarelto anticoagulation  Essential hypertension Controlled on current medications      Tamara Harp MD [facility], [facility] 02/08/2013 4:36 PM

## 2013-06-24 ENCOUNTER — Emergency Department (HOSPITAL_COMMUNITY)
Admission: EM | Admit: 2013-06-24 | Discharge: 2013-06-24 | Disposition: A | Discharge status: Home / Self Care | Payer: Medicaid Other | Attending: Emergency Medicine | Admitting: Emergency Medicine

## 2013-06-24 ENCOUNTER — Encounter (HOSPITAL_COMMUNITY): Payer: Self-pay | Admitting: Emergency Medicine

## 2013-06-24 ENCOUNTER — Emergency Department (HOSPITAL_COMMUNITY): Payer: Medicaid Other

## 2013-06-24 DIAGNOSIS — E119 Type 2 diabetes mellitus without complications: Secondary | ICD-10-CM | POA: Insufficient documentation

## 2013-06-24 DIAGNOSIS — Z87891 Personal history of nicotine dependence: Secondary | ICD-10-CM | POA: Insufficient documentation

## 2013-06-24 DIAGNOSIS — K219 Gastro-esophageal reflux disease without esophagitis: Secondary | ICD-10-CM | POA: Insufficient documentation

## 2013-06-24 DIAGNOSIS — Z86711 Personal history of pulmonary embolism: Secondary | ICD-10-CM | POA: Insufficient documentation

## 2013-06-24 DIAGNOSIS — Z7902 Long term (current) use of antithrombotics/antiplatelets: Secondary | ICD-10-CM | POA: Insufficient documentation

## 2013-06-24 DIAGNOSIS — A599 Trichomoniasis, unspecified: Secondary | ICD-10-CM

## 2013-06-24 DIAGNOSIS — F319 Bipolar disorder, unspecified: Secondary | ICD-10-CM | POA: Insufficient documentation

## 2013-06-24 DIAGNOSIS — Z9861 Coronary angioplasty status: Secondary | ICD-10-CM | POA: Insufficient documentation

## 2013-06-24 DIAGNOSIS — Z79899 Other long term (current) drug therapy: Secondary | ICD-10-CM | POA: Insufficient documentation

## 2013-06-24 DIAGNOSIS — I2510 Atherosclerotic heart disease of native coronary artery without angina pectoris: Secondary | ICD-10-CM | POA: Insufficient documentation

## 2013-06-24 DIAGNOSIS — A5901 Trichomonal vulvovaginitis: Secondary | ICD-10-CM | POA: Insufficient documentation

## 2013-06-24 DIAGNOSIS — N390 Urinary tract infection, site not specified: Secondary | ICD-10-CM

## 2013-06-24 DIAGNOSIS — J45901 Unspecified asthma with (acute) exacerbation: Principal | ICD-10-CM

## 2013-06-24 DIAGNOSIS — E7800 Pure hypercholesterolemia, unspecified: Secondary | ICD-10-CM | POA: Insufficient documentation

## 2013-06-24 DIAGNOSIS — I509 Heart failure, unspecified: Secondary | ICD-10-CM | POA: Insufficient documentation

## 2013-06-24 DIAGNOSIS — Z8669 Personal history of other diseases of the nervous system and sense organs: Secondary | ICD-10-CM | POA: Insufficient documentation

## 2013-06-24 DIAGNOSIS — Z794 Long term (current) use of insulin: Secondary | ICD-10-CM | POA: Insufficient documentation

## 2013-06-24 DIAGNOSIS — J441 Chronic obstructive pulmonary disease with (acute) exacerbation: Secondary | ICD-10-CM | POA: Insufficient documentation

## 2013-06-24 DIAGNOSIS — IMO0002 Reserved for concepts with insufficient information to code with codable children: Secondary | ICD-10-CM | POA: Insufficient documentation

## 2013-06-24 DIAGNOSIS — R0789 Other chest pain: Secondary | ICD-10-CM | POA: Insufficient documentation

## 2013-06-24 LAB — COMPREHENSIVE METABOLIC PANEL
ALT: 9 U/L (ref 0–35)
AST: 12 U/L (ref 0–37)
Albumin: 3 g/dL — ABNORMAL LOW (ref 3.5–5.2)
Alkaline Phosphatase: 116 U/L (ref 39–117)
BUN: 16 mg/dL (ref 6–23)
CALCIUM: 8.7 mg/dL (ref 8.4–10.5)
CHLORIDE: 102 meq/L (ref 96–112)
CO2: 25 meq/L (ref 19–32)
CREATININE: 0.91 mg/dL (ref 0.50–1.10)
GFR calc Af Amer: 82 mL/min — ABNORMAL LOW (ref >90)
GFR, EST NON AFRICAN AMERICAN: 71 mL/min — AB (ref >90)
GLUCOSE: 257 mg/dL — AB (ref 70–99)
Potassium: 4.5 mEq/L (ref 3.7–5.3)
Sodium: 138 mEq/L (ref 137–147)
Total Protein: 7.9 g/dL (ref 6.0–8.3)

## 2013-06-24 LAB — URINALYSIS, ROUTINE W REFLEX MICROSCOPIC
Bilirubin Urine: NEGATIVE
GLUCOSE, UA: 250 mg/dL — AB
Hgb urine dipstick: NEGATIVE
Ketones, ur: NEGATIVE mg/dL
Nitrite: NEGATIVE
PROTEIN: NEGATIVE mg/dL
SPECIFIC GRAVITY, URINE: 1.029 (ref 1.005–1.030)
UROBILINOGEN UA: 0.2 mg/dL (ref 0.0–1.0)
pH: 5.5 (ref 5.0–8.0)

## 2013-06-24 LAB — CBC WITH DIFFERENTIAL/PLATELET
Basophils Absolute: 0 10*3/uL (ref 0.0–0.1)
Basophils Relative: 0 % (ref 0–1)
EOS ABS: 0.2 10*3/uL (ref 0.0–0.7)
Eosinophils Relative: 2 % (ref 0–5)
HCT: 34.3 % — ABNORMAL LOW (ref 36.0–46.0)
HEMOGLOBIN: 10.6 g/dL — AB (ref 12.0–15.0)
Lymphocytes Relative: 31 % (ref 12–46)
Lymphs Abs: 2.8 10*3/uL (ref 0.7–4.0)
MCH: 24.8 pg — AB (ref 26.0–34.0)
MCHC: 30.9 g/dL (ref 30.0–36.0)
MCV: 80.3 fL (ref 78.0–100.0)
Monocytes Absolute: 0.4 10*3/uL (ref 0.1–1.0)
Monocytes Relative: 4 % (ref 3–12)
NEUTROS PCT: 62 % (ref 43–77)
Neutro Abs: 5.6 10*3/uL (ref 1.7–7.7)
PLATELETS: 290 10*3/uL (ref 150–400)
RBC: 4.27 MIL/uL (ref 3.87–5.11)
RDW: 17.1 % — ABNORMAL HIGH (ref 11.5–15.5)
WBC: 9.1 10*3/uL (ref 4.0–10.5)

## 2013-06-24 LAB — URINE MICROSCOPIC-ADD ON

## 2013-06-24 LAB — PRO B NATRIURETIC PEPTIDE: PRO B NATRI PEPTIDE: 256.4 pg/mL — AB (ref 0–125)

## 2013-06-24 MED ORDER — FUROSEMIDE 80 MG PO TABS
80.0000 mg | ORAL_TABLET | Freq: Once | ORAL | Status: AC
  Administered 2013-06-24: 80 mg via ORAL
  Filled 2013-06-24: qty 1

## 2013-06-24 MED ORDER — ALBUTEROL SULFATE (2.5 MG/3ML) 0.083% IN NEBU: 5.0000 mg | INHALATION_SOLUTION | RESPIRATORY_TRACT | Status: DC | PRN

## 2013-06-24 MED ORDER — ALBUTEROL SULFATE (2.5 MG/3ML) 0.083% IN NEBU
5.0000 mg | INHALATION_SOLUTION | Freq: Once | RESPIRATORY_TRACT | Status: AC
  Administered 2013-06-24: 5 mg via RESPIRATORY_TRACT
  Filled 2013-06-24: qty 6

## 2013-06-24 MED ORDER — METRONIDAZOLE 500 MG PO TABS: 500.0000 mg | ORAL_TABLET | Freq: Two times a day (BID) | ORAL | Status: DC

## 2013-06-24 MED ORDER — IPRATROPIUM BROMIDE 0.02 % IN SOLN
0.5000 mg | Freq: Once | RESPIRATORY_TRACT | Status: AC
  Administered 2013-06-24: 0.5 mg via RESPIRATORY_TRACT
  Filled 2013-06-24: qty 2.5

## 2013-06-24 MED ORDER — GUAIFENESIN-CODEINE 100-10 MG/5ML PO SOLN: 5.0000 mL | Freq: Four times a day (QID) | ORAL | Status: DC | PRN

## 2013-06-24 MED ORDER — CEPHALEXIN 500 MG PO CAPS: 500.0000 mg | ORAL_CAPSULE | Freq: Two times a day (BID) | ORAL | Status: DC

## 2013-06-24 MED ORDER — GUAIFENESIN-CODEINE 100-10 MG/5ML PO SOLN
5.0000 mL | Freq: Once | ORAL | Status: AC
  Administered 2013-06-24: 5 mL via ORAL
  Filled 2013-06-24: qty 5

## 2013-06-24 MED ORDER — METHYLPREDNISOLONE SODIUM SUCC 125 MG IJ SOLR
125.0000 mg | Freq: Once | INTRAMUSCULAR | Status: AC
  Administered 2013-06-24: 125 mg via INTRAVENOUS
  Filled 2013-06-24: qty 2

## 2013-06-24 NOTE — ED Provider Notes (Signed)
CSN: [id number]     Arrival date & time 06/24/13  1722 History   First MD Initiated Contact with Patient 06/24/13 1728     Chief Complaint  Patient presents with  . Shortness of Breath   (Consider location/radiation/quality/duration/timing/severity/associated sxs/prior Treatment) HPI Comments: Patient is a 53 year old female past medical history significant for PE on Xarelto, DM, HTN, COPD, asthma, CAD, CHF, GERD, bipolar disorder, neuropathy, hypercholesterolemia, former tobacco user presenting to the emergency department for 4 days of gradually worsening productive cough with associated post tussive chest tightness and shortness of breath, nasal congestion, rhinorrhea. He states her cough is worse at night and laying down. She states she's tried Mucinex with minimal relief. Patient states she has also tried her daily nebulizers with minimal improvement. Patient states she has not tried her rescue inhaler. Patient states she did not take her Lasix today but otherwise took her home medications as prescribed today. Patient denies any fevers, chills, nausea, vomiting, chest pain, abdominal pain, unilateral edema, recent history of surgeries or mobilizations, recent travel.   Past Medical History  Diagnosis Date  . Diabetes mellitus without complication   . PE (pulmonary embolism)   . Hypercholesterolemia   . Hypertension   . COPD (chronic obstructive pulmonary disease)   . Asthma   . Coronary artery disease   . CHF (congestive heart failure)   . Mental disorder     BIPOLAR  . Shortness of breath   . GERD (gastroesophageal reflux disease)   . Neuromuscular disorder     DIABETIC NEUROPATHY  . Ischemic cardiomyopathy   . Bipolar disorder   . Post-traumatic stress syndrome    Past Surgical History  Procedure Laterality Date  . Coronary angioplasty with stent placement      CAD in 2006 x 2 and 2009 2 more- place din REx in [place] and [place] [facility]   Family History  Problem Relation Age of  Onset  . Stroke Mother   . Cancer Father    History  Substance Use Topics  . Smoking status: Former Smoker    Types: Cigarettes    Quit date: 11/24/2012  . Smokeless tobacco: Never Used  . Alcohol Use: No   OB History   Grav Para Term Preterm Abortions TAB SAB Ect Mult Living                 Review of Systems  Constitutional: Negative for fever and chills.  HENT: Positive for congestion and rhinorrhea.   Respiratory: Positive for cough, chest tightness, shortness of breath and wheezing.   Cardiovascular: Negative for chest pain.  Gastrointestinal: Negative for nausea, vomiting, abdominal pain and diarrhea.  All other systems reviewed and are negative.    Allergies  Nsaids  Home Medications   Current Outpatient Rx  Name  Route  Sig  Dispense  Refill  . albuterol (PROVENTIL HFA;VENTOLIN HFA) 108 (90 BASE) MCG/ACT inhaler   Inhalation   Inhale 2 puffs into the lungs every 6 (six) hours as needed for wheezing.         . carvedilol (COREG) 25 MG tablet   Oral   Take 25 mg by mouth 2 (two) times daily with a meal.         . clopidogrel (PLAVIX) 75 MG tablet   Oral   Take 75 mg by mouth daily.         . diazepam (VALIUM) 10 MG tablet   Oral   Take 10 mg by mouth every 8 (eight) hours  as needed for anxiety.         . fluticasone (FLONASE) 50 MCG/ACT nasal spray   Nasal   Place 2 sprays into the nose daily.         . furosemide (LASIX) 40 MG tablet   Oral   Take 120 mg by mouth 2 (two) times daily.          . insulin glargine (LANTUS) 100 UNIT/ML injection   Subcutaneous   Inject 55 Units into the skin at bedtime.         . insulin lispro (HUMALOG) 100 UNIT/ML injection   Subcutaneous   Inject into the skin. Inject 15-20 Units subcutaneously 3 (three) times daily before meals.         [name] Kitchen ipratropium-albuterol (DUONEB) 0.5-2.5 (3) MG/3ML SOLN   Nebulization   Take 3 mLs by nebulization every 6 (six) hours as needed (wheezing).          [name] Kitchen  lisinopril (PRINIVIL,ZESTRIL) 20 MG tablet   Oral   Take 20 mg by mouth daily.         [name] Kitchen omeprazole (PRILOSEC) 40 MG capsule   Oral   Take 40 mg by mouth 2 (two) times daily.         . potassium chloride (K-DUR) 10 MEQ tablet   Oral   Take 10 mEq by mouth daily. Take 1 tablet (10 mEq total) by mouth 2 (two) times daily.         . pravastatin (PRAVACHOL) 40 MG tablet   Oral   Take 40 mg by mouth daily.         . Rivaroxaban (XARELTO) 20 MG TABS   Oral   Take 1 tablet (20 mg total) by mouth daily with supper.   30 tablet   1   . tiZANidine (ZANAFLEX) 4 MG tablet   Oral   Take 4 mg by mouth every 6 (six) hours as needed for muscle spasms.         . traZODone (DESYREL) 100 MG tablet   Oral   Take 100 mg by mouth at bedtime.         [name] Kitchen albuterol (PROVENTIL) (2.5 MG/3ML) 0.083% nebulizer solution   Nebulization   Take 6 mLs (5 mg total) by nebulization every 4 (four) hours as needed for wheezing or shortness of breath.   75 mL   12   . cephALEXin (KEFLEX) 500 MG capsule   Oral   Take 1 capsule (500 mg total) by mouth 2 (two) times daily.   14 capsule   0   . guaiFENesin-codeine 100-10 MG/5ML syrup   Oral   Take 5 mLs by mouth every 6 (six) hours as needed for cough.   120 mL   0   . metroNIDAZOLE (FLAGYL) 500 MG tablet   Oral   Take 1 tablet (500 mg total) by mouth 2 (two) times daily.   14 tablet   0   . nitroGLYCERIN (NITROSTAT) 0.4 MG SL tablet   Sublingual   Place 1 tablet (0.4 mg total) under the tongue every 5 (five) minutes as needed for chest pain.   100 tablet   3    BP 139/68  Pulse 82  Temp(Src) 98.2 F (36.8 C) (Oral)  Resp 18  SpO2 92% Physical Exam  Constitutional: She is oriented to person, place, and time. She appears well-developed and well-nourished. No distress.  HENT:  Head: Normocephalic and atraumatic.  Right Ear: External ear normal.  Left  Ear: External ear normal.  Nose: Nose normal.  Mouth/Throat: Uvula is  midline, oropharynx is clear and moist and mucous membranes are normal. No trismus in the jaw. No uvula swelling. No tonsillar abscesses.  Eyes: Conjunctivae are normal.  Neck: Neck supple.  Cardiovascular: Normal rate, regular rhythm, normal heart sounds and intact distal pulses.   Pulmonary/Chest: Effort normal. No stridor. No respiratory distress. She has wheezes.  Patient talking in complete sentences.   Abdominal: Soft. There is no tenderness.  Musculoskeletal: She exhibits edema (1+ non pitting edema bilateral LE).  Lymphadenopathy:    She has no cervical adenopathy.  Neurological: She is alert and oriented to person, place, and time. No cranial nerve deficit.  Skin: Skin is warm and dry. She is not diaphoretic.    ED Course  Procedures (including critical care time) Medications  albuterol (PROVENTIL) (2.5 MG/3ML) 0.083% nebulizer solution 5 mg (5 mg Nebulization Given 06/24/13 1814)  methylPREDNISolone sodium succinate (SOLU-MEDROL) 125 mg/2 mL injection 125 mg (125 mg Intravenous Given 06/24/13 1834)  ipratropium (ATROVENT) nebulizer solution 0.5 mg (0.5 mg Nebulization Given 06/24/13 1814)  albuterol (PROVENTIL) (2.5 MG/3ML) 0.083% nebulizer solution 5 mg (5 mg Nebulization Given 06/24/13 1939)  guaiFENesin-codeine 100-10 MG/5ML solution 5 mL (5 mLs Oral Given 06/24/13 1938)  furosemide (LASIX) tablet 80 mg (80 mg Oral Given 06/24/13 2014)    Labs Review Labs Reviewed  COMPREHENSIVE METABOLIC PANEL - Abnormal; Notable for the following:    Glucose, Bld 257 (*)    Albumin 3.0 (*)    Total Bilirubin <0.2 (*)    GFR calc non Af Amer 71 (*)    GFR calc Af Amer 82 (*)    All other components within normal limits  CBC WITH DIFFERENTIAL - Abnormal; Notable for the following:    Hemoglobin 10.6 (*)    HCT 34.3 (*)    MCH 24.8 (*)    RDW 17.1 (*)    All other components within normal limits  URINALYSIS, ROUTINE W REFLEX MICROSCOPIC - Abnormal; Notable for the following:     APPearance CLOUDY (*)    Glucose, UA 250 (*)    Leukocytes, UA TRACE (*)    All other components within normal limits  PRO B NATRIURETIC PEPTIDE - Abnormal; Notable for the following:    Pro B Natriuretic peptide (BNP) 256.4 (*)    All other components within normal limits  URINE MICROSCOPIC-ADD ON - Abnormal; Notable for the following:    Squamous Epithelial / LPF MANY (*)    Bacteria, UA FEW (*)    All other components within normal limits   Imaging Review Dg Chest 2 View  06/24/2013   CLINICAL DATA:  Shortness of breath.  EXAM: CHEST  2 VIEW  COMPARISON:  11/27/2012  FINDINGS: Lungs are adequately inflated with stable scarring over the left mid to upper lung. There is no focal airspace consolidation or effusion. There is borderline cardiomegaly. Remainder the exam is unchanged.  IMPRESSION: No active cardiopulmonary disease.   Electronically Signed   By: Marin Olp M.D.   On: 06/24/2013 18:20    EKG Interpretation    Date/Time:  Sunday June 24 2013 17:39:09 EST Ventricular Rate:  75 PR Interval:  159 QRS Duration: 79 QT Interval:  447 QTC Calculation: 499 R Axis:   53 Text Interpretation:  Normal sinus rhythm old inferior infarct No significant change since last tracing Confirmed by HARRISON  MD, FORREST ([id number]) on 06/24/2013 6:04:51 PM  MDM   1. COPD exacerbation   2. Trichomonas infection   3. UTI (urinary tract infection)     Filed Vitals:   06/24/13 2225  BP:   Pulse: 82  Temp: 98.2 F (36.8 C)  Resp: 18    7:38 PM On re-evaluation patient maintaining oxygen saturations at 100% on RA, talking in complete sentences, wheezing is improving. Patient asking for home dose of Lasix as she missed her dose this morning.   Afebrile, NAD, non-toxic appearing, AAOx4. Upon arrival patient with cough, expiratory wheeze, nasal congestion. No tachypnea, hypoxia, tachycardia. No pitting edema. Patient without worsening shortness of breath from baseline.  Nebulizers, Robitussin with codeine, Solu-Medrol provided. Upon last reassessment lungs clear to auscultation and patient notes improvement in associated symptoms. Chest x-ray reviewed, no evidence of pneumonia or pulmonary edema. EKG unremarkable. BNP mildly elevated, patient has not taken home Lasix today, provided in ED. Urine reviewed, Trichomonas noted will treat patient. Will also treat patient given pyuria and comorbidities. Patient symptoms consistent with upper respiratory infection causing COPD exacerbation. Patient's symptoms improved. She is able to mean oxygen saturations above 90% on room air at rest. No signs of respiratory distress at any point in emergency department stay. Will discharge patient home with nebulizer refill, Robitussin with codeine, antibiotics. Return precautions extensively discussed with patient. Also discussed the patient will require discussion with PCP regarding her desire for home oxygen. Patient is agreeable to plan and wishes to be discharged home. Patient is stable at time of discharge. Patient d/w with Dr. Darl Householder, agrees with plan.        Harlow Mares, PA-C 06/25/13 0020

## 2013-06-24 NOTE — ED Notes (Signed)
Pt states she has had sob with exertion for past 6 months. States that she recently began to have cough which has made symptoms worse. Pt now states she has been having chest pain with coughing.

## 2013-06-24 NOTE — ED Notes (Addendum)
Pt complains of sob/cough since last night with yellow/green sputum. Pt tried to home neb tx without improvement today prior to calling EMs. EMS reports o2 94% on RA with wheezing. Gave pt 2 breathing treatments prior to arrival. Pt speaking in complete sentences. Denies chest pain

## 2013-06-24 NOTE — Discharge Instructions (Signed)
Please follow up with your primary care physician in 1-2 days. If you do not have one please call the [facility] number listed above. Please take your antibiotic until completion. Please use your inhaler and nebulizers as prescribed. Please take cough syrup as prescribed, please do not drive on this medication as it contains a narcotic. Please read all discharge instructions and return precautions.   Chronic Obstructive Pulmonary Disease Chronic obstructive pulmonary disease (COPD) is a common lung condition in which airflow from the lungs is limited. COPD is a general term that can be used to describe many different lung problems that limit airflow, including both chronic bronchitis and emphysema. If you have COPD, your lung function will probably never return to normal, but there are measures you can take to improve lung function and make yourself feel better.  CAUSES   Smoking (common).   Exposure to secondhand smoke.   Genetic problems.  Chronic inflammatory lung diseases or recurrent infections. SYMPTOMS   Shortness of breath, especially with physical activity.   Deep, persistent (chronic) cough with a large amount of thick mucus.   Wheezing.   Rapid breaths (tachypnea).   Gray or bluish discoloration (cyanosis) of the skin, especially in fingers, toes, or lips.   Fatigue.   Weight loss.   Frequent infections or episodes when breathing symptoms become much worse (exacerbations).   Chest tightness. DIAGNOSIS  Your healthcare provider will take a medical history and perform a physical examination to make the initial diagnosis. Additional tests for COPD may include:   Lung (pulmonary) function tests.  Chest X-ray.  CT scan.  Blood tests. TREATMENT  Treatment available to help you feel better when you have COPD include:   Inhaler and nebulizer medicines. These help manage the symptoms of COPD and make your breathing more  comfortable  Supplemental oxygen. Supplemental oxygen is only helpful if you have a low oxygen level in your blood.   Exercise and physical activity. These are beneficial for nearly all people with COPD. Some people may also benefit from a pulmonary rehabilitation program. HOME CARE INSTRUCTIONS   Take all medicines (inhaled or pills) as directed by your health care provider.  Only take over-the-counter or prescription medicines for pain, fever, or discomfort as directed by your health care provider.   Avoid over-the-counter medicines or cough syrups that dry up your airway (such as antihistamines) and slow down the elimination of secretions unless instructed otherwise by your healthcare provider.   If you are a smoker, the most important thing that you can do is stop smoking. Continuing to smoke will cause further lung damage and breathing trouble. Ask your health care provider for help with quitting smoking. He or she can direct you to community resources or hospitals that provide support.  Avoid exposure to irritants such as smoke, chemicals, and fumes that aggravate your breathing.  Use oxygen therapy and pulmonary rehabilitation if directed by your health care provider. If you require home oxygen therapy, ask your healthcare provider whether you should purchase a pulse oximeter to measure your oxygen level at home.   Avoid contact with individuals who have a contagious illness.  Avoid extreme temperature and humidity changes.  Eat healthy foods. Eating smaller, more frequent meals and resting before meals may help you maintain your strength.  Stay active, but balance activity with periods of rest. Exercise and physical activity will help you maintain your ability to do things you want to do.  Preventing infection and hospitalization  is very important when you have COPD. Make sure to receive all the vaccines your health care provider recommends, especially the pneumococcal and  influenza vaccines. Ask your healthcare provider whether you need a pneumonia vaccine.  Learn and use relaxation techniques to manage stress.  Learn and use controlled breathing techniques as directed by your health care provider. Controlled breathing techniques include:   Pursed lip breathing. Start by breathing in (inhaling) through your nose for 1 second. Then, purse your lips as if you were going to whistle and breathe out (exhale) through the pursed lips for 2 seconds.   Diaphragmatic breathing. Start by putting one hand on your abdomen just above your waist. Inhale slowly through your nose. The hand on your abdomen should move out. Then purse your lips and exhale slowly. You should be able to feel the hand on your abdomen moving in as you exhale.   Learn and use controlled coughing to clear mucus from your lungs. Controlled coughing is a series of short, progressive coughs. The steps of controlled coughing are:  1. Lean your head slightly forward.  2. Breathe in deeply using diaphragmatic breathing.  3. Try to hold your breath for 3 seconds.  4. Keep your mouth slightly open while coughing twice.  5. Spit any mucus out into a tissue.  6. Rest and repeat the steps once or twice as needed. SEEK MEDICAL CARE IF:   You are coughing up more mucus than usual.   There is a change in the color or thickness of your mucus.   Your breathing is more labored than usual.   Your breathing is faster than usual.  SEEK IMMEDIATE MEDICAL CARE IF:   You have shortness of breath while you are resting.   You have shortness of breath that prevents you from:  Being able to talk.   Performing your usual physical activities.   You have chest pain lasting longer than 5 minutes.   Your skin color is more cyanotic than usual.  You measure low oxygen saturations for longer than 5 minutes with a pulse oximeter. MAKE SURE YOU:   Understand these instructions.  Will watch your  condition.  Will get help right away if you are not doing well or get worse. Document Released: 02/24/2005 Document Revised: 03/07/2013 Document Reviewed: 01/11/2013 [facility] Patient Information 2014 [place], [place].   Asthma, Adult Asthma is a recurring condition in which the airways tighten and narrow. Asthma can make it difficult to breathe. It can cause coughing, wheezing, and shortness of breath. Asthma episodes (also called asthma attacks) range from minor to life-threatening. Asthma cannot be cured, but medicines and lifestyle changes can help control it. CAUSES Asthma is believed to be caused by inherited (genetic) and environmental factors, but its exact cause is unknown. Asthma may be triggered by allergens, lung infections, or irritants in the air. Asthma triggers are different for each person. Common triggers include:   Animal dander.  Dust mites.  Cockroaches.  Pollen from trees or grass.  Mold.  Smoke.  Air pollutants such as dust, household cleaners, hair sprays, aerosol sprays, paint fumes, strong chemicals, or strong odors.  Cold air, weather changes, and winds (which increase molds and pollens in the air).  Strong emotional expressions such as crying or laughing hard.  Stress.  Certain medicines (such as aspirin) or types of drugs (such as beta-blockers).  Sulfites in foods and drinks. Foods and drinks that may contain sulfites include dried fruit, potato chips, and sparkling grape juice.  Infections or inflammatory conditions such as the flu, a cold, or an inflammation of the nasal membranes (rhinitis).  Gastroesophageal reflux disease (GERD).  Exercise or strenuous activity. SYMPTOMS Symptoms may occur immediately after asthma is triggered or many hours later. Symptoms include:  Wheezing.  Excessive nighttime or early morning coughing.  Frequent or severe coughing with a common cold.  Chest tightness.  Shortness of breath. DIAGNOSIS  The  diagnosis of asthma is made by a review of your medical history and a physical exam. Tests may also be performed. These may include:  Lung function studies. These tests show how much air you breath in and out.  Allergy tests.  Imaging tests such as X-rays. TREATMENT  Asthma cannot be cured, but it can usually be controlled. Treatment involves identifying and avoiding your asthma triggers. It also involves medicines. There are 2 classes of medicine used for asthma treatment:   Controller medicines. These prevent asthma symptoms from occurring. They are usually taken every day.  Reliever or rescue medicines. These quickly relieve asthma symptoms. They are used as needed and provide short-term relief. Your health care provider will help you create an asthma action plan. An asthma action plan is a written plan for managing and treating your asthma attacks. It includes a list of your asthma triggers and how they may be avoided. It also includes information on when medicines should be taken and when their dosage should be changed. An action plan may also involve the use of a device called a peak flow meter. A peak flow meter measures how well the lungs are working. It helps you monitor your condition. HOME CARE INSTRUCTIONS   Take medicine as directed by your health care provider. Speak with your health care provider if you have questions about how or when to take the medicines.  Use a peak flow meter as directed by your health care provider. Record and keep track of readings.  Understand and use the action plan to help minimize or stop an asthma attack without needing to seek medical care.  Control your home environment in the following ways to help prevent asthma attacks:  Do not smoke. Avoid being exposed to secondhand smoke.  Change your heating and air conditioning filter regularly.  Limit your use of fireplaces and wood stoves.  Get rid of pests (such as roaches and mice) and their  droppings.  Throw away plants if you see mold on them.  Clean your floors and dust regularly. Use unscented cleaning products.  Try to have someone else vacuum for you regularly. Stay out of rooms while they are being vacuumed and for a short while afterward. If you vacuum, use a dust mask from a hardware store, a double-layered or microfilter vacuum cleaner bag, or a vacuum cleaner with a HEPA filter.  Replace carpet with wood, tile, or vinyl flooring. Carpet can trap dander and dust.  Use allergy-proof pillows, mattress covers, and box spring covers.  Wash bed sheets and blankets every week in hot water and dry them in a dryer.  Use blankets that are made of polyester or cotton.  Clean bathrooms and kitchens with bleach. If possible, have someone repaint the walls in these rooms with mold-resistant paint. Keep out of the rooms that are being cleaned and painted.  Wash hands frequently. SEEK MEDICAL CARE IF:   You have wheezing, shortness of breath, or a cough even if taking medicine to prevent attacks.  The colored mucus you cough up (sputum) is thicker than  usual.  Your sputum changes from clear or white to yellow, green, gray, or bloody.  You have any problems that may be related to the medicines you are taking (such as a rash, itching, swelling, or trouble breathing).  You are using a reliever medicine more than 2 3 times per week.  Your peak flow is still at 50 79% of you personal best after following your action plan for 1 hour. SEEK IMMEDIATE MEDICAL CARE IF:   You seem to be getting worse and are unresponsive to treatment during an asthma attack.  You are short of breath even at rest.  You get short of breath when doing very little physical activity.  You have difficulty eating, drinking, or talking due to asthma symptoms.  You develop chest pain.  You develop a fast heartbeat.  You have a bluish color to your lips or fingernails.  You are lightheaded, dizzy,  or faint.  Your peak flow is less than 50% of your personal best.  You have a fever or persistent symptoms for more than 2 3 days.  You have a fever and symptoms suddenly get worse. MAKE SURE YOU:   Understand these instructions.  Will watch your condition.  Will get help right away if you are not doing well or get worse. Document Released: 05/17/2005 Document Revised: 01/17/2013 Document Reviewed: 12/14/2012 [facility] Patient Information 2014 [place], [place]. Trichomoniasis Trichomoniasis is an infection, caused by the Trichomonas organism, that affects both women and men. In women, the outer female genitalia and the vagina are affected. In men, the penis is mainly affected, but the prostate and other reproductive organs can also be involved. Trichomoniasis is a sexually transmitted disease (STD) and is most often passed to another person through sexual contact. The majority of people who get trichomoniasis do so from a sexual encounter and are also at risk for other STDs. CAUSES   Sexual intercourse with an infected partner.  It can be present in swimming pools or hot tubs. SYMPTOMS   Abnormal gray-green frothy vaginal discharge in women.  Vaginal itching and irritation in women.  Itching and irritation of the area outside the vagina in women.  Penile discharge with or without pain in males.  Inflammation of the urethra (urethritis), causing painful urination.  Bleeding after sexual intercourse. RELATED COMPLICATIONS  Pelvic inflammatory disease.  Infection of the uterus (endometritis).  Infertility.  Tubal (ectopic) pregnancy.  It can be associated with other STDs, including gonorrhea and chlamydia, hepatitis B, and HIV. COMPLICATIONS DURING PREGNANCY  Early (premature) delivery.  Premature rupture of the membranes (PROM).  Low birth weight. DIAGNOSIS   Visualization of Trichomonas under the microscope from the vagina discharge.  Ph of the vagina greater  than 4.5, tested with a test tape.  Trich Rapid Test.  Culture of the organism, but this is not usually needed.  It may be found on a Pap test.  Having a "strawberry cervix,"which means the cervix looks very red like a strawberry. TREATMENT   You may be given medication to fight the infection. Inform your caregiver if you could be or are pregnant. Some medications used to treat the infection should not be taken during pregnancy.  Over-the-counter medications or creams to decrease itching or irritation may be recommended.  Your sexual partner will need to be treated if infected. HOME CARE INSTRUCTIONS   Take all medication prescribed by your caregiver.  Take over-the-counter medication for itching or irritation as directed by your caregiver.  Do not have sexual  intercourse while you have the infection.  Do not douche or wear tampons.  Discuss your infection with your partner, as your partner may have acquired the infection from you. Or, your partner may have been the person who transmitted the infection to you.  Have your sex partner examined and treated if necessary.  Practice safe, informed, and protected sex.  See your caregiver for other STD testing. SEEK MEDICAL CARE IF:   You still have symptoms after you finish the medication.  You have an oral temperature above 102 F (38.9 C).  You develop belly (abdominal) pain.  You have pain when you urinate.  You have bleeding after sexual intercourse.  You develop a rash.  The medication makes you sick or makes you throw up (vomit). Document Released: 11/10/2000 Document Revised: 08/09/2011 Document Reviewed: 12/06/2008 [facility] Patient Information 2014 [place], [place].

## 2013-06-27 NOTE — ED Provider Notes (Signed)
Medical screening examination/treatment/procedure(s) were performed by non-physician practitioner and as supervising physician I was immediately available for consultation/collaboration.  EKG Interpretation    Date/Time:  Sunday June 24 2013 17:39:09 EST Ventricular Rate:  75 PR Interval:  159 QRS Duration: 79 QT Interval:  447 QTC Calculation: 499 R Axis:   53 Text Interpretation:  Normal sinus rhythm old inferior infarct No significant change since last tracing Confirmed by HARRISON  MD, FORREST [phone]) on 06/24/2013 6:04:51 PM              Wandra Arthurs, MD 06/27/13 1900

## 2013-06-29 ENCOUNTER — Emergency Department (HOSPITAL_COMMUNITY): Payer: Medicaid Other

## 2013-06-29 ENCOUNTER — Encounter (HOSPITAL_COMMUNITY): Payer: Self-pay | Admitting: Emergency Medicine

## 2013-06-29 ENCOUNTER — Emergency Department (HOSPITAL_COMMUNITY)
Admission: EM | Admit: 2013-06-29 | Discharge: 2013-06-29 | Disposition: A | Discharge status: Home / Self Care | Payer: Medicaid Other | Attending: Emergency Medicine | Admitting: Emergency Medicine

## 2013-06-29 DIAGNOSIS — Z7902 Long term (current) use of antithrombotics/antiplatelets: Secondary | ICD-10-CM | POA: Insufficient documentation

## 2013-06-29 DIAGNOSIS — E669 Obesity, unspecified: Secondary | ICD-10-CM | POA: Insufficient documentation

## 2013-06-29 DIAGNOSIS — F319 Bipolar disorder, unspecified: Secondary | ICD-10-CM | POA: Insufficient documentation

## 2013-06-29 DIAGNOSIS — Z86711 Personal history of pulmonary embolism: Secondary | ICD-10-CM | POA: Insufficient documentation

## 2013-06-29 DIAGNOSIS — E7800 Pure hypercholesterolemia, unspecified: Secondary | ICD-10-CM | POA: Insufficient documentation

## 2013-06-29 DIAGNOSIS — I2510 Atherosclerotic heart disease of native coronary artery without angina pectoris: Secondary | ICD-10-CM | POA: Insufficient documentation

## 2013-06-29 DIAGNOSIS — J441 Chronic obstructive pulmonary disease with (acute) exacerbation: Secondary | ICD-10-CM | POA: Insufficient documentation

## 2013-06-29 DIAGNOSIS — K219 Gastro-esophageal reflux disease without esophagitis: Secondary | ICD-10-CM | POA: Insufficient documentation

## 2013-06-29 DIAGNOSIS — Z9861 Coronary angioplasty status: Secondary | ICD-10-CM | POA: Insufficient documentation

## 2013-06-29 DIAGNOSIS — R609 Edema, unspecified: Secondary | ICD-10-CM | POA: Insufficient documentation

## 2013-06-29 DIAGNOSIS — Z792 Long term (current) use of antibiotics: Secondary | ICD-10-CM | POA: Insufficient documentation

## 2013-06-29 DIAGNOSIS — Z87891 Personal history of nicotine dependence: Secondary | ICD-10-CM | POA: Insufficient documentation

## 2013-06-29 DIAGNOSIS — Z7901 Long term (current) use of anticoagulants: Secondary | ICD-10-CM | POA: Insufficient documentation

## 2013-06-29 DIAGNOSIS — I10 Essential (primary) hypertension: Secondary | ICD-10-CM | POA: Insufficient documentation

## 2013-06-29 DIAGNOSIS — IMO0002 Reserved for concepts with insufficient information to code with codable children: Secondary | ICD-10-CM | POA: Insufficient documentation

## 2013-06-29 DIAGNOSIS — E1142 Type 2 diabetes mellitus with diabetic polyneuropathy: Secondary | ICD-10-CM | POA: Insufficient documentation

## 2013-06-29 DIAGNOSIS — R630 Anorexia: Secondary | ICD-10-CM | POA: Insufficient documentation

## 2013-06-29 DIAGNOSIS — Z794 Long term (current) use of insulin: Secondary | ICD-10-CM | POA: Insufficient documentation

## 2013-06-29 DIAGNOSIS — J45901 Unspecified asthma with (acute) exacerbation: Principal | ICD-10-CM

## 2013-06-29 DIAGNOSIS — Z79899 Other long term (current) drug therapy: Secondary | ICD-10-CM | POA: Insufficient documentation

## 2013-06-29 DIAGNOSIS — I509 Heart failure, unspecified: Secondary | ICD-10-CM

## 2013-06-29 DIAGNOSIS — E1149 Type 2 diabetes mellitus with other diabetic neurological complication: Secondary | ICD-10-CM | POA: Insufficient documentation

## 2013-06-29 DIAGNOSIS — J449 Chronic obstructive pulmonary disease, unspecified: Secondary | ICD-10-CM

## 2013-06-29 LAB — POCT I-STAT TROPONIN I: TROPONIN I, POC: 0.01 ng/mL (ref 0.00–0.08)

## 2013-06-29 LAB — BASIC METABOLIC PANEL
BUN: 16 mg/dL (ref 6–23)
CALCIUM: 8.5 mg/dL (ref 8.4–10.5)
CO2: 25 mEq/L (ref 19–32)
CREATININE: 0.85 mg/dL (ref 0.50–1.10)
Chloride: 99 mEq/L (ref 96–112)
GFR calc Af Amer: 89 mL/min — ABNORMAL LOW (ref >90)
GFR, EST NON AFRICAN AMERICAN: 77 mL/min — AB (ref >90)
GLUCOSE: 247 mg/dL — AB (ref 70–99)
Potassium: 5 mEq/L (ref 3.7–5.3)
Sodium: 136 mEq/L — ABNORMAL LOW (ref 137–147)

## 2013-06-29 LAB — CBC
HCT: 36.3 % (ref 36.0–46.0)
Hemoglobin: 11.4 g/dL — ABNORMAL LOW (ref 12.0–15.0)
MCH: 25.1 pg — AB (ref 26.0–34.0)
MCHC: 31.4 g/dL (ref 30.0–36.0)
MCV: 80 fL (ref 78.0–100.0)
PLATELETS: 332 10*3/uL (ref 150–400)
RBC: 4.54 MIL/uL (ref 3.87–5.11)
RDW: 17 % — AB (ref 11.5–15.5)
WBC: 10.6 10*3/uL — ABNORMAL HIGH (ref 4.0–10.5)

## 2013-06-29 LAB — PRO B NATRIURETIC PEPTIDE: PRO B NATRI PEPTIDE: 770.4 pg/mL — AB (ref 0–125)

## 2013-06-29 MED ORDER — ALBUTEROL SULFATE HFA 108 (90 BASE) MCG/ACT IN AERS: 2.0000 | INHALATION_SPRAY | Freq: Four times a day (QID) | RESPIRATORY_TRACT | Status: DC | PRN

## 2013-06-29 MED ORDER — PREDNISONE 20 MG PO TABS: 40.0000 mg | ORAL_TABLET | Freq: Every day | ORAL | Status: DC

## 2013-06-29 MED ORDER — IPRATROPIUM-ALBUTEROL 0.5-2.5 (3) MG/3ML IN SOLN
3.0000 mL | Freq: Once | RESPIRATORY_TRACT | Status: AC
  Administered 2013-06-29: 3 mL via RESPIRATORY_TRACT
  Filled 2013-06-29: qty 3

## 2013-06-29 MED ORDER — IPRATROPIUM-ALBUTEROL 0.5-2.5 (3) MG/3ML IN SOLN: 3.0000 mL | Freq: Four times a day (QID) | RESPIRATORY_TRACT | Status: DC | PRN

## 2013-06-29 MED ORDER — FUROSEMIDE 40 MG PO TABS: 120.0000 mg | ORAL_TABLET | Freq: Two times a day (BID) | ORAL | Status: DC

## 2013-06-29 MED ORDER — FUROSEMIDE 10 MG/ML IJ SOLN
40.0000 mg | Freq: Once | INTRAMUSCULAR | Status: AC
  Administered 2013-06-29: 40 mg via INTRAVENOUS
  Filled 2013-06-29: qty 4

## 2013-06-29 MED ORDER — CARVEDILOL 25 MG PO TABS: 25.0000 mg | ORAL_TABLET | Freq: Two times a day (BID) | ORAL | Status: DC

## 2013-06-29 NOTE — ED Notes (Signed)
Bed: WA14 Expected date:  Expected time:  Means of arrival:  Comments: ems 

## 2013-06-29 NOTE — ED Notes (Signed)
IV RN at bedside 

## 2013-06-29 NOTE — ED Provider Notes (Signed)
4:55 PM  Assumed care from Dr. Tomi Bamberger.  Pt is a 53 y.o. female with history of COPD, CHF who presents emergency department with increased peripheral edema. Patient recently ran out of her Lasix. She has received a breathing treatment and Lasix in the ED has been able to ambulate and has no hypoxia or respiratory distress. Labs are unremarkable other than mild leukocytosis. BNP pending. Chest x-ray shows no infiltrate or no edema. Anticipate discharge home if patient is stable and still doing well.  5:38 PM  Pt's BNP is slightly elevated at 770. She's asked for a second DuoNeb treatment. On reevaluation, patient is well-appearing, in no respiratory distress, no hypoxia, her lungs are clear to auscultation with good aeration. We'll discharge him with refill for her albuterol, Lasix, DuoNeb. Patient reports that she is also normally on steroids when she has a COPD exacerbation. Have discussed with patient that given she has insulin dependent diabetes she will need to closely monitor this. Have given strict return precautions. Patient verbalizes understanding is comfortable with plan for discharge home with outpatient followup.  [name], DO 06/29/13 1739

## 2013-06-29 NOTE — ED Notes (Signed)
IV team paged.  

## 2013-06-29 NOTE — ED Notes (Addendum)
Pt here for SOB, copd flare up. Was here several days ago for same, was diagnosed with viral URI. EMS gave nebulizer with albuterol and atrovent. Pt speaking in complete sentences.

## 2013-06-29 NOTE — ED Provider Notes (Signed)
CSN: [id number]     Arrival date & time 06/29/13  1234 History  First MD Initiated Contact with Patient 06/29/13 1253     Chief Complaint  Patient presents with  . Shortness of Breath    HPI Patient presents to the emergency room with complaints of COPD flare and shortness of breath. Patient states the symptoms started over a week ago. She has been having trouble with cough productive of clear white mucus. She has felt short of breath especially with exertion. She also has noticed increased leg swelling. Patient states she was seen in the emergency department on January 25. She was diagnosed with an upper respiratory infection and treated for a COPD exacerbation. Patient states she is noted some improvement and was doing better but she ran out of her cough medication.   Patient also states that she ran out of her Lasix.  Patient was told by her doctor that she was not due for a refill yet. Patient has noticed increasing leg swelling associated with not having her medications.  Patient denies any chest pain. She denies any fevers. Past Medical History  Diagnosis Date  . Diabetes mellitus without complication   . PE (pulmonary embolism)   . Hypercholesterolemia   . Hypertension   . COPD (chronic obstructive pulmonary disease)   . Asthma   . Coronary artery disease   . CHF (congestive heart failure)   . Mental disorder     BIPOLAR  . Shortness of breath   . GERD (gastroesophageal reflux disease)   . Neuromuscular disorder     DIABETIC NEUROPATHY  . Ischemic cardiomyopathy   . Bipolar disorder   . Post-traumatic stress syndrome    Past Surgical History  Procedure Laterality Date  . Coronary angioplasty with stent placement      CAD in 2006 x 2 and 2009 2 more- place din R[facility] in [facility]   Family History  Problem Relation Age of Onset  . Stroke Mother   . Cancer Father    History  Substance Use Topics  . Smoking status: Former Smoker    Types: Cigarettes    Quit date:  11/24/2012  . Smokeless tobacco: Never Used  . Alcohol Use: No   OB History   Grav Para Term Preterm Abortions TAB SAB Ect Mult Living                 Review of Systems  Constitutional: Positive for appetite change. Negative for fever.  Respiratory: Positive for shortness of breath.   Cardiovascular: Negative for chest pain.  All other systems reviewed and are negative.    Allergies  Nsaids  Home Medications   Current Outpatient Rx  Name  Route  Sig  Dispense  Refill  . albuterol (PROVENTIL) (2.5 MG/3ML) 0.083% nebulizer solution   Nebulization   Take 6 mLs (5 mg total) by nebulization every 4 (four) hours as needed for wheezing or shortness of breath.   75 mL   12   . cephALEXin (KEFLEX) 500 MG capsule   Oral   Take 1 capsule (500 mg total) by mouth 2 (two) times daily.   14 capsule   0   . clopidogrel (PLAVIX) 75 MG tablet   Oral   Take 75 mg by mouth daily.         . diazepam (VALIUM) 10 MG tablet   Oral   Take 10 mg by mouth every 8 (eight) hours as needed for anxiety.         [name] Kitchen  fluticasone (FLONASE) 50 MCG/ACT nasal spray   Nasal   Place 2 sprays into the nose daily.         [name] Kitchen guaiFENesin-codeine 100-10 MG/5ML syrup   Oral   Take 5 mLs by mouth every 6 (six) hours as needed for cough.   120 mL   0   . insulin glargine (LANTUS) 100 UNIT/ML injection   Subcutaneous   Inject 55 Units into the skin at bedtime.         . insulin lispro (HUMALOG) 100 UNIT/ML injection   Subcutaneous   Inject into the skin. Inject 15-20 Units subcutaneously 3 (three) times daily before meals.         [name] Kitchen lisinopril (PRINIVIL,ZESTRIL) 20 MG tablet   Oral   Take 20 mg by mouth daily.         . metroNIDAZOLE (FLAGYL) 500 MG tablet   Oral   Take 1 tablet (500 mg total) by mouth 2 (two) times daily.   14 tablet   0   . omeprazole (PRILOSEC) 40 MG capsule   Oral   Take 40 mg by mouth 2 (two) times daily.         . potassium chloride (K-DUR) 10 MEQ  tablet   Oral   Take 10 mEq by mouth daily. Take 1 tablet (10 mEq total) by mouth 2 (two) times daily.         . pravastatin (PRAVACHOL) 40 MG tablet   Oral   Take 40 mg by mouth daily.         . Rivaroxaban (XARELTO) 20 MG TABS   Oral   Take 1 tablet (20 mg total) by mouth daily with supper.   30 tablet   1   . tiZANidine (ZANAFLEX) 4 MG tablet   Oral   Take 4 mg by mouth every 6 (six) hours as needed for muscle spasms.         . traZODone (DESYREL) 100 MG tablet   Oral   Take 100 mg by mouth at bedtime.         [name] Kitchen albuterol (PROVENTIL HFA;VENTOLIN HFA) 108 (90 BASE) MCG/ACT inhaler   Inhalation   Inhale 2 puffs into the lungs every 6 (six) hours as needed for wheezing.   1 Inhaler   1   . carvedilol (COREG) 25 MG tablet   Oral   Take 1 tablet (25 mg total) by mouth 2 (two) times daily with a meal.   60 tablet   1   . furosemide (LASIX) 40 MG tablet   Oral   Take 3 tablets (120 mg total) by mouth 2 (two) times daily.   30 tablet   1   . ipratropium-albuterol (DUONEB) 0.5-2.5 (3) MG/3ML SOLN   Nebulization   Take 3 mLs by nebulization every 6 (six) hours as needed (wheezing).   360 mL   1   . nitroGLYCERIN (NITROSTAT) 0.4 MG SL tablet   Sublingual   Place 1 tablet (0.4 mg total) under the tongue every 5 (five) minutes as needed for chest pain.   100 tablet   3    BP 133/82  Pulse 70  Resp 20  SpO2 100% Physical Exam  Nursing note and vitals reviewed. Constitutional: No distress.  Obese  HENT:  Head: Normocephalic and atraumatic.  Right Ear: External ear normal.  Left Ear: External ear normal.  Eyes: Conjunctivae are normal. Right eye exhibits no discharge. Left eye exhibits no discharge. No scleral icterus.  Neck:  Neck supple. No tracheal deviation present.  Cardiovascular: Normal rate, regular rhythm and intact distal pulses.   Pulmonary/Chest: Effort normal and breath sounds normal. No stridor. No respiratory distress. She has no wheezes.  She has no rales.  Breathing easily, able to speak in full sentences without any oxygen supplementation, oxygen saturation is 100% on supplemental oxygen  Abdominal: Soft. Bowel sounds are normal. She exhibits no distension. There is no tenderness. There is no rebound and no guarding.  Musculoskeletal: She exhibits edema. She exhibits no tenderness.  Neurological: She is alert. She has normal strength. No cranial nerve deficit (no facial droop, extraocular movements intact, no slurred speech) or sensory deficit. She exhibits normal muscle tone. She displays no seizure activity. Coordination normal.  Skin: Skin is warm and dry. No rash noted. She is not diaphoretic.  Psychiatric: She has a normal mood and affect.    ED Course  Procedures (including critical care time) Labs Review Labs Reviewed  BASIC METABOLIC PANEL - Abnormal; Notable for the following:    Sodium 136 (*)    Glucose, Bld 247 (*)    GFR calc non Af Amer 77 (*)    GFR calc Af Amer 89 (*)    All other components within normal limits  CBC - Abnormal; Notable for the following:    WBC 10.6 (*)    Hemoglobin 11.4 (*)    MCH 25.1 (*)    RDW 17.0 (*)    All other components within normal limits  PRO B NATRIURETIC PEPTIDE  POCT I-STAT TROPONIN I   Imaging Review Dg Chest 2 View  06/29/2013   CLINICAL DATA:  Shortness of breath, cough.  EXAM: CHEST  2 VIEW  COMPARISON:  06/24/2013  FINDINGS: Heart is normal size. Diffuse interstitial prominence throughout the lungs, stable compatible with chronic interstitial lung disease. Scarring in the left upper lobe. Small bilateral pleural effusions. No acute bony abnormality.  IMPRESSION: Chronic interstitial changes, stable.  Small bilateral pleural effusions.   Electronically Signed   By: Rolm Baptise M.D.   On: 06/29/2013 15:10    EKG Interpretation   None      EKG Normal sinus rhythm rate 71 Inferior Q waves Poor R-wave progression Nonspecific flattened T waves No prior EKG  for comparison  MDM   1. COPD (chronic obstructive pulmonary disease)   2. Peripheral edema   3. CHF (congestive heart failure)    Pt has chronic chf.  Has run out of her medications.  Overall appears well in the ED.  No oxygen requirement.  Pt able to ambulate to the restroom.  BNP is pending but anticipate discharge home with refill of her medications.    Kathalene Frames, MD 06/29/13 [phone]

## 2013-06-29 NOTE — Discharge Instructions (Signed)
Chronic Obstructive Pulmonary Disease Exacerbation Chronic obstructive pulmonary disease (COPD) is a common lung condition in which airflow from the lungs is limited. COPD is a general term that can be used to describe many different lung problems that limit airflow, including chronic bronchitis and emphysema. COPD exacerbations are episodes when breathing symptoms become much worse and require extra treatment. Without treatment, COPD exacerbations can be life threatening, and frequent COPD exacerbations can cause further damage to your lungs. CAUSES   Respiratory infections.   Exposure to smoke.   Exposure to air pollution, chemical fumes, or dust. Sometimes there is no apparent cause or trigger. RISK FACTORS  Smoking cigarettes.  Older age.  Frequent prior COPD exacerbations. SIGNS AND SYMPTOMS   Increased coughing.   Increased thick spit (sputum) production.   Increased wheezing.   Increased shortness of breath.   Rapid breathing.   Chest tightness. DIAGNOSIS  Your medical history, a physical exam, and tests will help your health care provider make a diagnosis. Tests may include:  A chest X-ray.  Basic lab tests.  Sputum testing.  An arterial blood gas test. TREATMENT  Depending on the severity of your COPD exacerbation, you may need to be admitted to a hospital for treatment. Some of the treatments commonly used to treat COPD exacerbations are:   Antibiotic medicines.   Bronchodilators. These are drugs that expand the air passages. They may be given with an inhaler or nebulizer. Spacer devices may be needed to help improve drug delivery.  Corticosteroid medicines.  Supplemental oxygen therapy.  HOME CARE INSTRUCTIONS   Do not smoke. Quitting smoking is very important to prevent COPD from getting worse and exacerbations from happening as often.  Avoid exposure to all substances that irritate the airway, especially to tobacco smoke.   If prescribed,  take your antibiotics as directed. Finish them even if you start to feel better.  Only take over-the-counter or prescription medicines as directed by your health care provider.It is important to use correct technique with inhaled medicines.  Drink enough fluids to keep your urine clear or pale yellow (unless you have a medical condition that requires fluid restriction).  Use a cool mist vaporizer. This makes it easier to clear your chest when you cough.   If you have a home nebulizer and oxygen, continue to use them as directed.   Maintain all necessary vaccinations to prevent infections.   Exercise regularly.   Eat a healthy diet.   Keep all follow-up appointments as directed by your health care provider. SEEK IMMEDIATE MEDICAL CARE IF:  You have worsening shortness of breath.   You have trouble talking.   You have severe chest pain.  You have blood in your sputum.  You have a fever.  You have weakness, vomit repeatedly, or faint.   You feel confused.   You continue to get worse. MAKE SURE YOU:   Understand these instructions.  Will watch your condition.  Will get help right away if you are not doing well or get worse. Document Released: 03/14/2007 Document Revised: 03/07/2013 Document Reviewed: 01/19/2013 [facility] Patient Information 2014 [place].  Heart Failure Heart failure is a condition in which the heart has trouble pumping blood. This means your heart does not pump blood efficiently for your body to work well. In some cases of heart failure, fluid may back up into your lungs or you may have swelling (edema) in your lower legs. Heart failure is usually a long-term (chronic) condition. It is important for you  to take good care of yourself and follow your caregiver's treatment plan. CAUSES  Some health conditions can cause heart failure. Those health conditions include:  High blood pressure (hypertension) causes the heart muscle to work harder  than normal. When pressure in the blood vessels is high, the heart needs to pump (contract) with more force in order to circulate blood throughout the body. High blood pressure eventually causes the heart to become stiff and weak.  Coronary artery disease (CAD) is the buildup of cholesterol and fat (plaque) in the arteries of the heart. The blockage in the arteries deprives the heart muscle of oxygen and blood. This can cause chest pain and may lead to a heart attack. High blood pressure can also contribute to CAD.  Heart attack (myocardial infarction) occurs when 1 or more arteries in the heart become blocked. The loss of oxygen damages the muscle tissue of the heart. When this happens, part of the heart muscle dies. The injured tissue does not contract as well and weakens the heart's ability to pump blood.  Abnormal heart valves can cause heart failure when the heart valves do not open and close properly. This makes the heart muscle pump harder to keep the blood flowing.  Heart muscle disease (cardiomyopathy or myocarditis) is damage to the heart muscle from a variety of causes. These can include drug or alcohol abuse, infections, or unknown reasons. These can increase the risk of heart failure.  Lung disease makes the heart work harder because the lungs do not work properly. This can cause a strain on the heart, leading it to fail.  Diabetes increases the risk of heart failure. High blood sugar contributes to high fat (lipid) levels in the blood. Diabetes can also cause slow damage to tiny blood vessels that carry important nutrients to the heart muscle. When the heart does not get enough oxygen and food, it can cause the heart to become weak and stiff. This leads to a heart that does not contract efficiently.  Other conditions can contribute to heart failure. These include abnormal heart rhythms, thyroid problems, and low blood counts (anemia). Certain unhealthy behaviors can increase the risk of  heart failure. Those unhealthy behaviors include:  Being overweight.  Smoking or chewing tobacco.  Eating foods high in fat and cholesterol.  Abusing illicit drugs or alcohol.  Lacking physical activity. SYMPTOMS  Heart failure symptoms may vary and can be hard to detect. Symptoms may include:  Shortness of breath with activity, such as climbing stairs.  Persistent cough.  Swelling of the feet, ankles, legs, or abdomen.  Unexplained weight gain.  Difficulty breathing when lying flat (orthopnea).  Waking from sleep because of the need to sit up and get more air.  Rapid heartbeat.  Fatigue and loss of energy.  Feeling lightheaded, dizzy, or close to fainting.  Loss of appetite.  Nausea.  Increased urination during the night (nocturia). DIAGNOSIS  A diagnosis of heart failure is based on your history, symptoms, physical examination, and diagnostic tests. Diagnostic tests for heart failure may include:  Echocardiography.  Electrocardiography.  Chest X-ray.  Blood tests.  Exercise stress test.  Cardiac angiography.  Radionuclide scans. TREATMENT  Treatment is aimed at managing the symptoms of heart failure. Medicines, behavioral changes, or surgical intervention may be necessary to treat heart failure.  Medicines to help treat heart failure may include:  Angiotensin-converting enzyme (ACE) inhibitors. This type of medicine blocks the effects of a blood protein called angiotensin-converting enzyme. ACE inhibitors relax (dilate)  the blood vessels and help lower blood pressure.  Angiotensin receptor blockers. This type of medicine blocks the actions of a blood protein called angiotensin. Angiotensin receptor blockers dilate the blood vessels and help lower blood pressure.  Water pills (diuretics). Diuretics cause the kidneys to remove salt and water from the blood. The extra fluid is removed through urination. This loss of extra fluid lowers the volume of blood  the heart pumps.  Beta blockers. These prevent the heart from beating too fast and improve heart muscle strength.  Digitalis. This increases the force of the heartbeat.  Healthy behavior changes include:  Obtaining and maintaining a healthy weight.  Stopping smoking or chewing tobacco.  Eating heart healthy foods.  Limiting or avoiding alcohol.  Stopping illicit drug use.  Physical activity as directed by your caregiver.  Surgical treatment for heart failure may include:  A procedure to open blocked arteries, repair damaged heart valves, or remove damaged heart muscle tissue.  A pacemaker to improve heart muscle function and control certain abnormal heart rhythms.  An internal cardioverter defibrillator to treat certain serious abnormal heart rhythms.  A left ventricular assist device to assist the pumping ability of the heart. HOME CARE INSTRUCTIONS   Take your medicine as directed by your caregiver. Medicines are important in reducing the workload of your heart, slowing the progression of heart failure, and improving your symptoms.  Do not stop taking your medicine unless directed by your caregiver.  Do not skip any dose of medicine.  Refill your prescriptions before you run out of medicine. Your medicines are needed every day.  Take over-the-counter medicine only as directed by your caregiver or pharmacist.  Engage in moderate physical activity if directed by your caregiver. Moderate physical activity can benefit some people. The elderly and people with severe heart failure should consult with a caregiver for physical activity recommendations.  Eat heart healthy foods. Food choices should be free of trans fat and low in saturated fat, cholesterol, and salt (sodium). Healthy choices include fresh or frozen fruits and vegetables, fish, lean meats, legumes, fat-free or low-fat dairy products, and whole grain or high fiber foods. Talk to a dietitian to learn more about heart  healthy foods.  Limit sodium if directed by your caregiver. Sodium restriction may reduce symptoms of heart failure in some people. Talk to a dietitian to learn more about heart healthy seasonings.  Use healthy cooking methods. Healthy cooking methods include roasting, grilling, broiling, baking, poaching, steaming, or stir-frying. Talk to a dietitian to learn more about healthy cooking methods.  Limit fluids if directed by your caregiver. Fluid restriction may reduce symptoms of heart failure in some people.  Weigh yourself every day. Daily weights are important in the early recognition of excess fluid. You should weigh yourself every morning after you urinate and before you eat breakfast. Wear the same amount of clothing each time you weigh yourself. Record your daily weight. Provide your caregiver with your weight record.  Monitor and record your blood pressure if directed by your caregiver.  Check your pulse if directed by your caregiver.  Lose weight if directed by your caregiver. Weight loss may reduce symptoms of heart failure in some people.  Stop smoking or chewing tobacco. Nicotine makes your heart work harder by causing your blood vessels to constrict. Do not use nicotine gum or patches before talking to your caregiver.  Schedule and attend follow-up visits as directed by your caregiver. It is important to keep all your appointments.  Limit alcohol intake to no more than 1 drink per day for nonpregnant women and 2 drinks per day for men. Drinking more than that is harmful to your heart. Tell your caregiver if you drink alcohol several times a week. Talk with your caregiver about whether alcohol is safe for you. If your heart has already been damaged by alcohol or you have severe heart failure, drinking alcohol should be stopped completely.  Stop illicit drug use.  Stay up-to-date with immunizations. It is especially important to prevent respiratory infections through current  pneumococcal and influenza immunizations.  Manage other health conditions such as hypertension, diabetes, thyroid disease, or abnormal heart rhythms as directed by your caregiver.  Learn to manage stress.  Plan rest periods when fatigued.  Learn strategies to manage high temperatures. If the weather is extremely hot:  Avoid vigorous physical activity.  Use air conditioning or fans or seek a cooler location.  Avoid caffeine and alcohol.  Wear loose-fitting, lightweight, and light-colored clothing.  Learn strategies to manage cold temperatures. If the weather is extremely cold:  Avoid vigorous physical activity.  Layer clothes.  Wear mittens or gloves, a hat, and a scarf when going outside.  Avoid alcohol.  Obtain ongoing education and support as needed.  Participate or seek rehabilitation as needed to maintain or improve independence and quality of life. SEEK MEDICAL CARE IF:   Your weight increases by 03 lb/1.4 kg in 1 day or 05 lb/2.3 kg in a week.  You have increasing shortness of breath that is unusual for you.  You are unable to participate in your usual physical activities.  You tire easily.  You cough more than normal, especially with physical activity.  You have any or more swelling in areas such as your hands, feet, ankles, or abdomen.  You are unable to sleep because it is hard to breathe.  You feel like your heart is beating fast (palpitations).  You become dizzy or lightheaded upon standing up. SEEK IMMEDIATE MEDICAL CARE IF:   You have difficulty breathing.  There is a change in mental status such as decreased alertness or difficulty with concentration.  You have a pain or discomfort in your chest.  You have an episode of fainting (syncope). MAKE SURE YOU:   Understand these instructions.  Will watch your condition.  Will get help right away if you are not doing well or get worse. Document Released: 05/17/2005 Document Revised: 09/11/2012  Document Reviewed: 06/08/2012 [facility] Patient Information 2014 [place], [place].

## 2013-07-01 ENCOUNTER — Inpatient Hospital Stay (HOSPITAL_COMMUNITY)
Admission: EM | Admit: 2013-07-01 | Discharge: 2013-07-05 | DRG: 292 | Disposition: A | Discharge status: Home / Self Care | Payer: Medicaid Other | Attending: Family Medicine | Admitting: Family Medicine

## 2013-07-01 ENCOUNTER — Encounter (HOSPITAL_COMMUNITY): Payer: Self-pay | Admitting: Emergency Medicine

## 2013-07-01 DIAGNOSIS — E785 Hyperlipidemia, unspecified: Secondary | ICD-10-CM

## 2013-07-01 DIAGNOSIS — I2782 Chronic pulmonary embolism: Secondary | ICD-10-CM | POA: Diagnosis present

## 2013-07-01 DIAGNOSIS — Z87891 Personal history of nicotine dependence: Secondary | ICD-10-CM

## 2013-07-01 DIAGNOSIS — IMO0001 Reserved for inherently not codable concepts without codable children: Secondary | ICD-10-CM

## 2013-07-01 DIAGNOSIS — F319 Bipolar disorder, unspecified: Secondary | ICD-10-CM | POA: Diagnosis present

## 2013-07-01 DIAGNOSIS — Z794 Long term (current) use of insulin: Secondary | ICD-10-CM

## 2013-07-01 DIAGNOSIS — J449 Chronic obstructive pulmonary disease, unspecified: Secondary | ICD-10-CM

## 2013-07-01 DIAGNOSIS — I517 Cardiomegaly: Secondary | ICD-10-CM

## 2013-07-01 DIAGNOSIS — A599 Trichomoniasis, unspecified: Secondary | ICD-10-CM | POA: Diagnosis present

## 2013-07-01 DIAGNOSIS — I5033 Acute on chronic diastolic (congestive) heart failure: Secondary | ICD-10-CM

## 2013-07-01 DIAGNOSIS — I509 Heart failure, unspecified: Secondary | ICD-10-CM | POA: Diagnosis present

## 2013-07-01 DIAGNOSIS — I10 Essential (primary) hypertension: Secondary | ICD-10-CM | POA: Diagnosis present

## 2013-07-01 DIAGNOSIS — Z720 Tobacco use: Secondary | ICD-10-CM

## 2013-07-01 DIAGNOSIS — K5900 Constipation, unspecified: Secondary | ICD-10-CM | POA: Diagnosis not present

## 2013-07-01 DIAGNOSIS — I2510 Atherosclerotic heart disease of native coronary artery without angina pectoris: Secondary | ICD-10-CM

## 2013-07-01 DIAGNOSIS — I2589 Other forms of chronic ischemic heart disease: Secondary | ICD-10-CM | POA: Diagnosis present

## 2013-07-01 DIAGNOSIS — E7800 Pure hypercholesterolemia, unspecified: Secondary | ICD-10-CM | POA: Diagnosis present

## 2013-07-01 DIAGNOSIS — Z79899 Other long term (current) drug therapy: Secondary | ICD-10-CM

## 2013-07-01 DIAGNOSIS — F4310 Post-traumatic stress disorder, unspecified: Secondary | ICD-10-CM | POA: Diagnosis present

## 2013-07-01 DIAGNOSIS — F17200 Nicotine dependence, unspecified, uncomplicated: Secondary | ICD-10-CM

## 2013-07-01 DIAGNOSIS — E119 Type 2 diabetes mellitus without complications: Secondary | ICD-10-CM

## 2013-07-01 DIAGNOSIS — I5043 Acute on chronic combined systolic (congestive) and diastolic (congestive) heart failure: Principal | ICD-10-CM | POA: Diagnosis present

## 2013-07-01 DIAGNOSIS — J4489 Other specified chronic obstructive pulmonary disease: Secondary | ICD-10-CM | POA: Diagnosis present

## 2013-07-01 DIAGNOSIS — Z6841 Body Mass Index (BMI) 40.0 and over, adult: Secondary | ICD-10-CM

## 2013-07-01 DIAGNOSIS — E1149 Type 2 diabetes mellitus with other diabetic neurological complication: Secondary | ICD-10-CM | POA: Diagnosis present

## 2013-07-01 DIAGNOSIS — K219 Gastro-esophageal reflux disease without esophagitis: Secondary | ICD-10-CM | POA: Diagnosis present

## 2013-07-01 DIAGNOSIS — E1142 Type 2 diabetes mellitus with diabetic polyneuropathy: Secondary | ICD-10-CM | POA: Diagnosis present

## 2013-07-01 LAB — POCT I-STAT, CHEM 8
BUN: 16 mg/dL (ref 6–23)
CALCIUM ION: 1.17 mmol/L (ref 1.12–1.23)
CREATININE: 1 mg/dL (ref 0.50–1.10)
Chloride: 101 mEq/L (ref 96–112)
GLUCOSE: 194 mg/dL — AB (ref 70–99)
HCT: 36 % (ref 36.0–46.0)
HEMOGLOBIN: 12.2 g/dL (ref 12.0–15.0)
Potassium: 3.9 mEq/L (ref 3.7–5.3)
Sodium: 141 mEq/L (ref 137–147)
TCO2: 30 mmol/L (ref 0–100)

## 2013-07-01 LAB — GLUCOSE, CAPILLARY
GLUCOSE-CAPILLARY: 145 mg/dL — AB (ref 70–99)
GLUCOSE-CAPILLARY: 335 mg/dL — AB (ref 70–99)
Glucose-Capillary: 157 mg/dL — ABNORMAL HIGH (ref 70–99)
Glucose-Capillary: 373 mg/dL — ABNORMAL HIGH (ref 70–99)

## 2013-07-01 LAB — POCT I-STAT TROPONIN I: Troponin i, poc: 0 ng/mL (ref 0.00–0.08)

## 2013-07-01 LAB — HEMOGLOBIN A1C
HEMOGLOBIN A1C: 8.9 % — AB (ref <5.7)
Mean Plasma Glucose: 209 mg/dL — ABNORMAL HIGH (ref <117)

## 2013-07-01 LAB — PRO B NATRIURETIC PEPTIDE: Pro B Natriuretic peptide (BNP): 1194 pg/mL — ABNORMAL HIGH (ref 0–125)

## 2013-07-01 MED ORDER — SODIUM CHLORIDE 0.9 % IJ SOLN: 3.0000 mL | INTRAMUSCULAR | Status: DC | PRN

## 2013-07-01 MED ORDER — FUROSEMIDE 10 MG/ML IJ SOLN: 120.0000 mg | Freq: Two times a day (BID) | INTRAVENOUS | Status: DC

## 2013-07-01 MED ORDER — RIVAROXABAN 20 MG PO TABS
20.0000 mg | ORAL_TABLET | Freq: Every day | ORAL | Status: DC
  Administered 2013-07-01 – 2013-07-04 (×4): 20 mg via ORAL
  Filled 2013-07-01 (×5): qty 1

## 2013-07-01 MED ORDER — FUROSEMIDE 10 MG/ML IJ SOLN
100.0000 mg | Freq: Two times a day (BID) | INTRAVENOUS | Status: AC
  Administered 2013-07-01 – 2013-07-02 (×4): 100 mg via INTRAVENOUS
  Filled 2013-07-01 (×4): qty 10

## 2013-07-01 MED ORDER — METRONIDAZOLE 500 MG PO TABS
500.0000 mg | ORAL_TABLET | Freq: Two times a day (BID) | ORAL | Status: DC
  Administered 2013-07-01 – 2013-07-02 (×3): 500 mg via ORAL
  Filled 2013-07-01 (×4): qty 1

## 2013-07-01 MED ORDER — ACETAMINOPHEN 325 MG PO TABS
650.0000 mg | ORAL_TABLET | ORAL | Status: DC | PRN
  Administered 2013-07-02: 650 mg via ORAL
  Filled 2013-07-01: qty 2

## 2013-07-01 MED ORDER — IPRATROPIUM-ALBUTEROL 0.5-2.5 (3) MG/3ML IN SOLN
3.0000 mL | Freq: Two times a day (BID) | RESPIRATORY_TRACT | Status: DC | PRN
  Administered 2013-07-02 – 2013-07-03 (×3): 3 mL via RESPIRATORY_TRACT
  Filled 2013-07-01 (×4): qty 3

## 2013-07-01 MED ORDER — IPRATROPIUM-ALBUTEROL 0.5-2.5 (3) MG/3ML IN SOLN
RESPIRATORY_TRACT | Status: AC
  Filled 2013-07-01: qty 3

## 2013-07-01 MED ORDER — FLUTICASONE PROPIONATE 50 MCG/ACT NA SUSP
2.0000 | Freq: Every day | NASAL | Status: DC
  Administered 2013-07-01 – 2013-07-05 (×5): 2 via NASAL
  Filled 2013-07-01: qty 16

## 2013-07-01 MED ORDER — CLOPIDOGREL BISULFATE 75 MG PO TABS
75.0000 mg | ORAL_TABLET | Freq: Every day | ORAL | Status: DC
  Administered 2013-07-01 – 2013-07-05 (×5): 75 mg via ORAL
  Filled 2013-07-01 (×6): qty 1

## 2013-07-01 MED ORDER — SIMVASTATIN 20 MG PO TABS
20.0000 mg | ORAL_TABLET | Freq: Every day | ORAL | Status: DC
  Administered 2013-07-01 – 2013-07-04 (×4): 20 mg via ORAL
  Filled 2013-07-01 (×5): qty 1

## 2013-07-01 MED ORDER — TRAZODONE HCL 100 MG PO TABS
100.0000 mg | ORAL_TABLET | Freq: Every day | ORAL | Status: DC
  Administered 2013-07-01 – 2013-07-04 (×4): 100 mg via ORAL
  Filled 2013-07-01 (×5): qty 1

## 2013-07-01 MED ORDER — CARVEDILOL 25 MG PO TABS
25.0000 mg | ORAL_TABLET | Freq: Two times a day (BID) | ORAL | Status: DC
Start: 2013-07-01 — End: 2013-07-05
  Administered 2013-07-01 – 2013-07-05 (×9): 25 mg via ORAL
  Filled 2013-07-01 (×11): qty 1

## 2013-07-01 MED ORDER — TIZANIDINE HCL 4 MG PO TABS
4.0000 mg | ORAL_TABLET | Freq: Four times a day (QID) | ORAL | Status: DC | PRN
  Administered 2013-07-04: 4 mg via ORAL
  Filled 2013-07-01: qty 1

## 2013-07-01 MED ORDER — ALBUTEROL SULFATE (2.5 MG/3ML) 0.083% IN NEBU
5.0000 mg | INHALATION_SOLUTION | Freq: Once | RESPIRATORY_TRACT | Status: AC
  Administered 2013-07-01: 5 mg via RESPIRATORY_TRACT
  Filled 2013-07-01: qty 6

## 2013-07-01 MED ORDER — ONDANSETRON HCL 4 MG/2ML IJ SOLN: 4.0000 mg | Freq: Four times a day (QID) | INTRAMUSCULAR | Status: DC | PRN

## 2013-07-01 MED ORDER — LISINOPRIL 20 MG PO TABS
20.0000 mg | ORAL_TABLET | Freq: Every day | ORAL | Status: DC
  Administered 2013-07-01 – 2013-07-05 (×5): 20 mg via ORAL
  Filled 2013-07-01 (×5): qty 1

## 2013-07-01 MED ORDER — SODIUM CHLORIDE 0.9 % IV SOLN: 250.0000 mL | INTRAVENOUS | Status: DC | PRN

## 2013-07-01 MED ORDER — PREDNISONE 20 MG PO TABS
40.0000 mg | ORAL_TABLET | Freq: Every day | ORAL | Status: DC
  Administered 2013-07-01 – 2013-07-04 (×4): 40 mg via ORAL
  Filled 2013-07-01 (×5): qty 2

## 2013-07-01 MED ORDER — IPRATROPIUM-ALBUTEROL 0.5-2.5 (3) MG/3ML IN SOLN
3.0000 mL | Freq: Four times a day (QID) | RESPIRATORY_TRACT | Status: DC
  Administered 2013-07-01 – 2013-07-05 (×17): 3 mL via RESPIRATORY_TRACT
  Filled 2013-07-01 (×17): qty 3

## 2013-07-01 MED ORDER — INSULIN ASPART 100 UNIT/ML ~~LOC~~ SOLN
0.0000 [IU] | Freq: Three times a day (TID) | SUBCUTANEOUS | Status: DC
  Administered 2013-07-01: 7 [IU] via SUBCUTANEOUS
  Administered 2013-07-01: 2 [IU] via SUBCUTANEOUS
  Administered 2013-07-01 – 2013-07-02 (×2): 1 [IU] via SUBCUTANEOUS
  Administered 2013-07-02: 3 [IU] via SUBCUTANEOUS
  Administered 2013-07-02: 2 [IU] via SUBCUTANEOUS
  Administered 2013-07-03: 5 [IU] via SUBCUTANEOUS
  Administered 2013-07-03: 9 [IU] via SUBCUTANEOUS
  Administered 2013-07-03 – 2013-07-04 (×2): 7 [IU] via SUBCUTANEOUS
  Administered 2013-07-04: 3 [IU] via SUBCUTANEOUS
  Administered 2013-07-04: 7 [IU] via SUBCUTANEOUS
  Administered 2013-07-05: 2 [IU] via SUBCUTANEOUS
  Administered 2013-07-05: 3 [IU] via SUBCUTANEOUS

## 2013-07-01 MED ORDER — INSULIN GLARGINE 100 UNIT/ML ~~LOC~~ SOLN
55.0000 [IU] | Freq: Every day | SUBCUTANEOUS | Status: DC
  Administered 2013-07-01 – 2013-07-04 (×4): 55 [IU] via SUBCUTANEOUS
  Filled 2013-07-01 (×5): qty 0.55

## 2013-07-01 MED ORDER — IPRATROPIUM-ALBUTEROL 0.5-2.5 (3) MG/3ML IN SOLN
3.0000 mL | RESPIRATORY_TRACT | Status: DC
  Administered 2013-07-01: 3 mL via RESPIRATORY_TRACT

## 2013-07-01 MED ORDER — PANTOPRAZOLE SODIUM 40 MG PO TBEC
40.0000 mg | DELAYED_RELEASE_TABLET | Freq: Every day | ORAL | Status: DC
  Administered 2013-07-01 – 2013-07-05 (×5): 40 mg via ORAL
  Filled 2013-07-01 (×6): qty 1

## 2013-07-01 MED ORDER — POTASSIUM CHLORIDE CRYS ER 10 MEQ PO TBCR
10.0000 meq | EXTENDED_RELEASE_TABLET | Freq: Two times a day (BID) | ORAL | Status: AC
  Administered 2013-07-01 – 2013-07-02 (×4): 10 meq via ORAL
  Filled 2013-07-01 (×4): qty 1

## 2013-07-01 MED ORDER — DIAZEPAM 5 MG PO TABS
10.0000 mg | ORAL_TABLET | Freq: Three times a day (TID) | ORAL | Status: DC | PRN
  Administered 2013-07-01 – 2013-07-04 (×5): 10 mg via ORAL
  Filled 2013-07-01 (×5): qty 2

## 2013-07-01 MED ORDER — INSULIN ASPART 100 UNIT/ML ~~LOC~~ SOLN
0.0000 [IU] | Freq: Every day | SUBCUTANEOUS | Status: DC
  Administered 2013-07-01: 5 [IU] via SUBCUTANEOUS
  Administered 2013-07-02: 3 [IU] via SUBCUTANEOUS
  Administered 2013-07-03 – 2013-07-04 (×2): 4 [IU] via SUBCUTANEOUS

## 2013-07-01 MED ORDER — SODIUM CHLORIDE 0.9 % IJ SOLN
3.0000 mL | Freq: Two times a day (BID) | INTRAMUSCULAR | Status: DC
  Administered 2013-07-01 – 2013-07-05 (×8): 3 mL via INTRAVENOUS

## 2013-07-01 MED ORDER — FUROSEMIDE 10 MG/ML IJ SOLN
100.0000 mg | Freq: Once | INTRAVENOUS | Status: AC
  Administered 2013-07-01: 100 mg via INTRAVENOUS
  Filled 2013-07-01: qty 10

## 2013-07-01 MED ORDER — POLYETHYLENE GLYCOL 3350 17 G PO PACK
17.0000 g | PACK | Freq: Three times a day (TID) | ORAL | Status: DC | PRN
  Administered 2013-07-01: 17 g via ORAL
  Filled 2013-07-01: qty 1

## 2013-07-01 MED ORDER — NITROGLYCERIN 0.4 MG SL SUBL: 0.4000 mg | SUBLINGUAL_TABLET | SUBLINGUAL | Status: DC | PRN

## 2013-07-01 MED ORDER — ALBUTEROL SULFATE (2.5 MG/3ML) 0.083% IN NEBU
2.5000 mg | INHALATION_SOLUTION | RESPIRATORY_TRACT | Status: DC | PRN
  Administered 2013-07-01: 2.5 mg via RESPIRATORY_TRACT
  Filled 2013-07-01: qty 3

## 2013-07-01 MED ORDER — INSULIN ASPART 100 UNIT/ML ~~LOC~~ SOLN
15.0000 [IU] | Freq: Three times a day (TID) | SUBCUTANEOUS | Status: DC
  Administered 2013-07-01 – 2013-07-05 (×14): 15 [IU] via SUBCUTANEOUS

## 2013-07-01 NOTE — Progress Notes (Signed)
  Echocardiogram 2D Echocardiogram has been performed.  Tamara Silva 07/01/2013, 11:02 AM

## 2013-07-01 NOTE — H&P (Signed)
[facility] History and Physical  Tamara Silva [id number] DOB: 11/06/1960 DOA: 07/01/2013  Referring physician:  EDP PCP: Philis Fendt, MD  Specialists:   Chief Complaint: SOB  HPI: Tamara Silva is a 53 y.o. female with Multiple Medical problems including Diastolic CHF, COPD, CAD and DM2 who presents to the ED with complaints of worsening SOB over the past week.  She also has had DOE, and Orthopnea, but she denies having chest pain.  She has been to the ED several times this week, but has not been able to refill her Lasix and other medications because her insurance will not approve the refill yet.  She denies having any fevers or chills, but has had a cough which has been non-productive.       Review of Systems:  Constitutional: No weight loss, night sweats, Fevers, Chills, Fatigue or Generalized Weakness HEENT: No headaches, Difficulty swallowing,Tooth/dental problems,Sore throat,  No sneezing, itching, ear ache, nasal congestion, post nasal drip,  Cardio-vascular:  No chest pain, +Orthopnea, PND, +Edema in lower extremities, Anasarca, dizziness, palpitations  Resp:  +SOB, +DOE. +Cough, No hemoptysis, No wheezing.  No chest wall deformity GI: No heartburn, indigestion, abdominal pain, nausea, vomiting, diarrhea, change in bowel habits, loss of appetite  GU: no dysuria, change in color of urine, no urgency or frequency. No flank pain.  Musculoskeletal: No joint pain or swelling. No decreased range of motion. No back pain.  Neurologic: No syncope, No Seizures, Muscle Weakness, Paresthesia, Vision disturbance or Loss, No Diplopia, No Vertigo, No Difficulty Walking,  Skin: no rash or lesions. Psych: No change in mood or affect. No depression or anxiety. No memory loss. No confusion or hallucinations   Past Medical History  Diagnosis Date  . Diabetes mellitus without complication   . PE (pulmonary embolism)   . Hypercholesterolemia   . Hypertension   . COPD (chronic  obstructive pulmonary disease)   . Asthma   . Coronary artery disease   . CHF (congestive heart failure)   . Mental disorder     BIPOLAR  . Shortness of breath   . GERD (gastroesophageal reflux disease)   . Neuromuscular disorder     DIABETIC NEUROPATHY  . Ischemic cardiomyopathy   . Bipolar disorder   . Post-traumatic stress syndrome      Past Surgical History  Procedure Laterality Date  . Coronary angioplasty with stent placement      CAD in 2006 x 2 and 2009 2 more- place din REx in [place] [facility] [place] [facility]    Prior to Admission medications   Medication Sig Start Date End Date Taking? Authorizing Provider  albuterol (PROVENTIL HFA;VENTOLIN HFA) 108 (90 BASE) MCG/ACT inhaler Inhale 2 puffs into the lungs every 6 (six) hours as needed for wheezing. 06/29/13  Yes Kathalene Frames, MD  albuterol (PROVENTIL) (2.5 MG/3ML) 0.083% nebulizer solution Take 6 mLs (5 mg total) by nebulization every 4 (four) hours as needed for wheezing or shortness of breath. 06/24/13  Yes Jennifer L Piepenbrink, PA-C  carvedilol (COREG) 25 MG tablet Take 1 tablet (25 mg total) by mouth 2 (two) times daily with a meal. 06/29/13  Yes Kathalene Frames, MD  cephALEXin (KEFLEX) 500 MG capsule Take 1 capsule (500 mg total) by mouth 2 (two) times daily. 06/24/13  Yes Jennifer L Piepenbrink, PA-C  clopidogrel (PLAVIX) 75 MG tablet Take 75 mg by mouth daily.   Yes Historical Provider, MD  diazepam (VALIUM) 10 MG tablet Take 10 mg by mouth every 8 (eight) hours  as needed for anxiety.   Yes Historical Provider, MD  fluticasone (FLONASE) 50 MCG/ACT nasal spray Place 2 sprays into the nose daily.   Yes Historical Provider, MD  insulin glargine (LANTUS) 100 UNIT/ML injection Inject 55 Units into the skin at bedtime. 11/28/12  Yes Nita Sells, MD  insulin lispro (HUMALOG) 100 UNIT/ML injection Inject 15-20 Units into the skin 3 (three) times daily before meals. Inject 15-20 Units subcutaneously 3 (three) times daily before meals.  10/21/12  Yes Historical Provider, MD  ipratropium-albuterol (DUONEB) 0.5-2.5 (3) MG/3ML SOLN Take 3 mLs by nebulization every 6 (six) hours as needed (wheezing). 06/29/13  Yes Kathalene Frames, MD  lisinopril (PRINIVIL,ZESTRIL) 20 MG tablet Take 20 mg by mouth daily.   Yes Historical Provider, MD  metroNIDAZOLE (FLAGYL) 500 MG tablet Take 1 tablet (500 mg total) by mouth 2 (two) times daily. 06/24/13  Yes Jennifer L Piepenbrink, PA-C  omeprazole (PRILOSEC) 40 MG capsule Take 40 mg by mouth 2 (two) times daily.   Yes Historical Provider, MD  potassium chloride (K-DUR) 10 MEQ tablet Take 10 mEq by mouth 2 (two) times daily. Take 1 tablet (10 mEq total) by mouth 2 (two) times daily. 10/21/12 10/21/13 Yes Historical Provider, MD  pravastatin (PRAVACHOL) 40 MG tablet Take 40 mg by mouth daily.   Yes Historical Provider, MD  predniSONE (DELTASONE) 20 MG tablet Take 2 tablets (40 mg total) by mouth daily. 06/29/13  Yes Kristen N Ward, DO  Rivaroxaban (XARELTO) 20 MG TABS Take 1 tablet (20 mg total) by mouth daily with supper. 11/28/12  Yes Nita Sells, MD  tiZANidine (ZANAFLEX) 4 MG tablet Take 4 mg by mouth every 6 (six) hours as needed for muscle spasms.   Yes Historical Provider, MD  traZODone (DESYREL) 100 MG tablet Take 100 mg by mouth at bedtime.   Yes Historical Provider, MD  furosemide (LASIX) 40 MG tablet Take 3 tablets (120 mg total) by mouth 2 (two) times daily. 06/29/13   Kathalene Frames, MD  guaiFENesin-codeine 100-10 MG/5ML syrup Take 5 mLs by mouth every 6 (six) hours as needed for cough. 06/24/13   Jennifer L Piepenbrink, PA-C  nitroGLYCERIN (NITROSTAT) 0.4 MG SL tablet Place 1 tablet (0.4 mg total) under the tongue every 5 (five) minutes as needed for chest pain. 02/08/13   Lorretta Harp, MD     Allergies  Allergen Reactions  . Nsaids Other (See Comments)    "I do not take NSAIDs d/t interfering with other meds"     Social History:  reports that she quit smoking about 7 months ago. Her  smoking use included Cigarettes. She smoked 0.00 packs per day. She has never used smokeless tobacco. She reports that she does not drink alcohol or use illicit drugs.     Family History  Problem Relation Age of Onset  . Stroke Mother   . Cancer Father        Physical Exam:  GEN:  Pleasant Morbidly Obese  53 y.o. African American female examined  and in no acute distress; cooperative with exam Filed Vitals:   07/01/13 0107 07/01/13 0113 07/01/13 0209  BP:  133/81   Pulse:  81   Temp:  98 F (36.7 C)   TempSrc:  Oral   Resp:  24   SpO2: 100% 100% 96%   Blood pressure 133/81, pulse 81, temperature 98 F (36.7 C), temperature source Oral, resp. rate 24, SpO2 96.00%. PSYCH: She is alert and oriented x4; does not appear anxious  does not appear depressed; affect is normal HEENT: Normocephalic and Atraumatic, Mucous membranes pink; PERRLA; EOM intact; Fundi:  Benign;  No scleral icterus, Nares: Patent, Oropharynx: Clear, Poor Dentition, Neck:  FROM, no cervical lymphadenopathy nor thyromegaly or carotid bruit; no JVD; Breasts:: Not examined CHEST WALL: No tenderness CHEST: Normal respiration, clear to auscultation bilaterally HEART: Regular rate and rhythm; no murmurs rubs or gallops BACK: No kyphosis or scoliosis; no CVA tenderness ABDOMEN: Positive Bowel Sounds, Obese, soft non-tender; no masses, no organomegaly, no pannus; no intertriginous candida. Rectal Exam: Not done EXTREMITIES: No cyanosis, clubbing,  1+EDEMA to BLEs, No ulcerations. Genitalia: not examined PULSES: 2+ and symmetric SKIN: Normal hydration no rash or ulceration CNS: Alert and Oriented X 4, Cranial Nerves Intact, Sensory and Motor Function Intact, Gait deferred.    Vascular: pulses palpable throughout    Labs on Admission:  Basic Metabolic Panel:  Recent Labs Lab 06/24/13 1838 06/29/13 1430 07/01/13 0252  NA 138 136* 141  K 4.5 5.0 3.9  CL 102 99 101  CO2 25 25  --   GLUCOSE 257* 247* 194*  BUN  16 16 16   CREATININE 0.91 0.85 1.00  CALCIUM 8.7 8.5  --    Liver Function Tests:  Recent Labs Lab 06/24/13 1838  AST 12  ALT 9  ALKPHOS 116  BILITOT <0.2*  PROT 7.9  ALBUMIN 3.0*   No results found for this basename: LIPASE, AMYLASE,  in the last 168 hours No results found for this basename: AMMONIA,  in the last 168 hours CBC:  Recent Labs Lab 06/24/13 1838 06/29/13 1430 07/01/13 0252  WBC 9.1 10.6*  --   NEUTROABS 5.6  --   --   HGB 10.6* 11.4* 12.2  HCT 34.3* 36.3 36.0  MCV 80.3 80.0  --   PLT 290 332  --    Cardiac Enzymes: No results found for this basename: CKTOTAL, CKMB, CKMBINDEX, TROPONINI,  in the last 168 hours  BNP (last 3 results)  Recent Labs  06/24/13 1838 06/29/13 1430 07/01/13 0235  PROBNP 256.4* 770.4* 1194.0*   CBG: No results found for this basename: GLUCAP,  in the last 168 hours  Radiological Exams on Admission: Dg Chest 2 View  06/29/2013   CLINICAL DATA:  Shortness of breath, cough.  EXAM: CHEST  2 VIEW  COMPARISON:  06/24/2013  FINDINGS: Heart is normal size. Diffuse interstitial prominence throughout the lungs, stable compatible with chronic interstitial lung disease. Scarring in the left upper lobe. Small bilateral pleural effusions. No acute bony abnormality.  IMPRESSION: Chronic interstitial changes, stable.  Small bilateral pleural effusions.   Electronically Signed   By: Rolm Baptise M.D.   On: 06/29/2013 15:10     EKG: Independently reviewed. Normal sinus Rhythm, rate 71,+PVC, and Evidence of prior Anterior and Inferior Infarcts.     Assessment/Plan:   53 y.o. female with  Principal Problem:   Acute on chronic diastolic CHF (congestive heart failure), NYHA class 1 Active Problems:   Coronary artery disease   Chronic pulmonary embolism   Essential hypertension   Hyperlipidemia   Insulin dependent diabetes mellitus   COPD (chronic obstructive pulmonary disease)      1.  Acute on Chronic Diastolic CHF- Placed on the  Acute CHF Protocol, to diurese with IV Lasix at 100mg  q 12 hours x 4 doses with KCL , already on Carvedilol,and Lisinopril rx.  A 2-D ECHO was done 11/2012.  O2 PRN.    2.  COPD-  DUONebs PRN,  O2 PRN.    3.  CAD- stable , on Beta Blocker Rx and  Plavix  Rx.    4.  Chronic Pulmonary Embolism-  On Xarelto Rx.    5.  HTN-  Continue Carvedilol, Lisinopril, and lasix RX. Monitor BPs.    6.  DM2 on Insulin RX-  Continue Lantus Rx, and SSI coverage PRN.  Check HbA1c in AM.    7.  Hyperlipidemia-  Continue Pravastatin Rx.    8.  DVT Rx- on Xarelto Rx.       Code Status:   FULL CODE    Family Communication:  Husband at Bedside   Disposition Plan:       Inpatient  Time spent:  [facility] Pager [phone]  If 7PM-7AM, please contact night-coverage [email or web address] Password [facility] 07/01/2013, 4:28 AM

## 2013-07-01 NOTE — ED Notes (Addendum)
Per EMS. Pt from home with complaint of SOB, pt. Is asthmatic, claimed of running out of meds, awaiting for refill. Pt. Also has CHF. Denies any pain. Received breathing treatment per EMS and claimed of little relief upon arrival. Pt. Was seen here yesterday with the same problems.

## 2013-07-01 NOTE — Progress Notes (Signed)
Patient seen and examined this morning. Per patient, she ran out of Lasix since she needed more at home and Medicaid won't pay for extra and she is not allowed early refills. No chest pain or tightness and this has been gradual over the past few months. Endorses 60 lbs weight gain in the past 6 months, thinks its all fluid. I do sense some dietary indiscretion as she had 2 packets of salt opened near her tray this morning.  - change diet to no salt - repeat 2D echo - continue IV diuresis, daily weights, strict I&Os  Venisa Frampton M. Cruzita Lederer, MD [facility] [phone]

## 2013-07-01 NOTE — ED Notes (Signed)
Bed: [id number] Expected date:  Expected time:  Means of arrival:  Comments: EMS/resp distress/albuterol neb x 2 with moderate relief

## 2013-07-01 NOTE — ED Provider Notes (Signed)
CSN: [id number]     Arrival date & time 07/01/13  0106 History   First MD Initiated Contact with Patient 07/01/13 0130     No chief complaint on file.  (Consider location/radiation/quality/duration/timing/severity/associated sxs/prior Treatment) The history is provided by the patient.   patient presents with shortness of breath. She was seen in the ED 2 days ago for same. At that time it was thought to be combination of CHF and COPD. She been out of her Lasix. She states she has not gotten better. She states she cannot get the Lasix filled the prescription. She has had breathing treatments by EMS. She states she's going to keep returning to the ER because she is scared that is going to go to her lungs. He also has had some dull chest pain. She's had a mildly productive cough. No fevers.  Past Medical History  Diagnosis Date  . Diabetes mellitus without complication   . PE (pulmonary embolism)   . Hypercholesterolemia   . Hypertension   . COPD (chronic obstructive pulmonary disease)   . Asthma   . Coronary artery disease   . CHF (congestive heart failure)   . Mental disorder     BIPOLAR  . Shortness of breath   . GERD (gastroesophageal reflux disease)   . Neuromuscular disorder     DIABETIC NEUROPATHY  . Ischemic cardiomyopathy   . Bipolar disorder   . Post-traumatic stress syndrome    Past Surgical History  Procedure Laterality Date  . Coronary angioplasty with stent placement      CAD in 2006 x 2 and 2009 2 more- place din REx in [place] [facility] [place] [facility]   Family History  Problem Relation Age of Onset  . Stroke Mother   . Cancer Father    History  Substance Use Topics  . Smoking status: Former Smoker    Types: Cigarettes    Quit date: 11/24/2012  . Smokeless tobacco: Never Used  . Alcohol Use: No   OB History   Grav Para Term Preterm Abortions TAB SAB Ect Mult Living                 Review of Systems  Constitutional: Positive for fatigue. Negative for activity change.   Respiratory: Positive for cough and shortness of breath.   Cardiovascular: Positive for chest pain.  Gastrointestinal: Negative for abdominal pain.  Genitourinary: Negative for dysuria.  Musculoskeletal: Negative for back pain.  Neurological: Negative for headaches.  Psychiatric/Behavioral: Negative for hallucinations.    Allergies  Nsaids  Home Medications   No current outpatient prescriptions on file. BP 137/69  Pulse 71  Temp(Src) 97.6 F (36.4 C) (Oral)  Resp 21  Ht 5\' 7"  (1.702 m)  Wt 315 lb 4.1 oz (143 kg)  BMI 49.36 kg/m2  SpO2 99% Physical Exam  Constitutional: She is oriented to person, place, and time.  Patient is morbidly obese.  HENT:  Head: Normocephalic.  Cardiovascular: Normal rate and regular rhythm.   Pulmonary/Chest:  Diffuse wheezes. Tachypnea. Prolonged expirations.  Abdominal: Soft. There is no tenderness.  Musculoskeletal: She exhibits edema.  Bilateral lower extremity pitting edema  Neurological: She is alert and oriented to person, place, and time.  Skin: Skin is warm.    ED Course  Procedures (including critical care time) Labs Review Labs Reviewed  PRO B NATRIURETIC PEPTIDE - Abnormal; Notable for the following:    Pro B Natriuretic peptide (BNP) 1194.0 (*)    All other components within normal limits  GLUCOSE,  CAPILLARY - Abnormal; Notable for the following:    Glucose-Capillary 157 (*)    All other components within normal limits  POCT I-STAT, CHEM 8 - Abnormal; Notable for the following:    Glucose, Bld 194 (*)    All other components within normal limits  HEMOGLOBIN A1C  POCT I-STAT TROPONIN I   Imaging Review Dg Chest 2 View  06/29/2013   CLINICAL DATA:  Shortness of breath, cough.  EXAM: CHEST  2 VIEW  COMPARISON:  06/24/2013  FINDINGS: Heart is normal size. Diffuse interstitial prominence throughout the lungs, stable compatible with chronic interstitial lung disease. Scarring in the left upper lobe. Small bilateral pleural  effusions. No acute bony abnormality.  IMPRESSION: Chronic interstitial changes, stable.  Small bilateral pleural effusions.   Electronically Signed   By: Rolm Baptise M.D.   On: 06/29/2013 15:10      Results for orders placed during the hospital encounter of 07/01/13  PRO B NATRIURETIC PEPTIDE      Result Value Range   Pro B Natriuretic peptide (BNP) 1194.0 (*) 0 - 125 pg/mL  GLUCOSE, CAPILLARY      Result Value Range   Glucose-Capillary 157 (*) 70 - 99 mg/dL  POCT I-STAT, CHEM 8      Result Value Range   Sodium 141  137 - 147 mEq/L   Potassium 3.9  3.7 - 5.3 mEq/L   Chloride 101  96 - 112 mEq/L   BUN 16  6 - 23 mg/dL   Creatinine, Ser 1.00  0.50 - 1.10 mg/dL   Glucose, Bld 194 (*) 70 - 99 mg/dL   Calcium, Ion 1.17  1.12 - 1.23 mmol/L   TCO2 30  0 - 100 mmol/L   Hemoglobin 12.2  12.0 - 15.0 g/dL   HCT 36.0  36.0 - 46.0 %  POCT I-STAT TROPONIN I      Result Value Range   Troponin i, poc 0.00  0.00 - 0.08 ng/mL   Comment 3                MDM   1. COPD (chronic obstructive pulmonary disease)   2. CHF (congestive heart failure)   3. Acute on chronic diastolic CHF (congestive heart failure), NYHA class 1   4. Chronic pulmonary embolism   5. Coronary artery disease   6. Essential hypertension   7. Hyperlipidemia   8. Insulin dependent diabetes mellitus   9. Tobacco abuse    Patient with shortness of breath. Likely combination of COPD and CHF. She's been seen twice this week in the ED. She did not fill her prescription for Lasix. She states she cannot look for a couple more days and needs to come in the hospital because she is worried that he'll go to her lungs. Her BNP is elevated. She states she will keep coming to the ED until she knows she is not going to die. Will admit to the hospital due to worsening CHF. Patient's wheezing is improved with breathing treatments.   Date: 07/01/2013  Rate: 71  Rhythm: normal sinus rhythm and premature ventricular contractions (PVC)   QRS Axis: normal  Intervals: normal  ST/T Wave abnormalities: normal  Conduction Disutrbances:none  Narrative Interpretation:   Old EKG Reviewed: unchanged    Jamilet Ambroise R. Alvino Chapel, MD 07/01/13 [phone]

## 2013-07-02 LAB — CBC
HCT: 35.1 % — ABNORMAL LOW (ref 36.0–46.0)
Hemoglobin: 10.9 g/dL — ABNORMAL LOW (ref 12.0–15.0)
MCH: 25.3 pg — ABNORMAL LOW (ref 26.0–34.0)
MCHC: 31.1 g/dL (ref 30.0–36.0)
MCV: 81.6 fL (ref 78.0–100.0)
Platelets: 335 10*3/uL (ref 150–400)
RBC: 4.3 MIL/uL (ref 3.87–5.11)
RDW: 17.3 % — ABNORMAL HIGH (ref 11.5–15.5)
WBC: 14.1 10*3/uL — AB (ref 4.0–10.5)

## 2013-07-02 LAB — BASIC METABOLIC PANEL
BUN: 22 mg/dL (ref 6–23)
CALCIUM: 8.6 mg/dL (ref 8.4–10.5)
CHLORIDE: 99 meq/L (ref 96–112)
CO2: 33 meq/L — AB (ref 19–32)
Creatinine, Ser: 1.04 mg/dL (ref 0.50–1.10)
GFR calc non Af Amer: 60 mL/min — ABNORMAL LOW (ref >90)
GFR, EST AFRICAN AMERICAN: 70 mL/min — AB (ref >90)
Glucose, Bld: 217 mg/dL — ABNORMAL HIGH (ref 70–99)
Potassium: 4.4 mEq/L (ref 3.7–5.3)
SODIUM: 141 meq/L (ref 137–147)

## 2013-07-02 LAB — GLUCOSE, CAPILLARY
GLUCOSE-CAPILLARY: 130 mg/dL — AB (ref 70–99)
GLUCOSE-CAPILLARY: 240 mg/dL — AB (ref 70–99)
GLUCOSE-CAPILLARY: 278 mg/dL — AB (ref 70–99)
Glucose-Capillary: 176 mg/dL — ABNORMAL HIGH (ref 70–99)

## 2013-07-02 MED ORDER — LEVOFLOXACIN 500 MG PO TABS
500.0000 mg | ORAL_TABLET | Freq: Every day | ORAL | Status: DC
  Administered 2013-07-02 – 2013-07-05 (×4): 500 mg via ORAL
  Filled 2013-07-02 (×4): qty 1

## 2013-07-02 NOTE — Progress Notes (Signed)
PROGRESS NOTE  Tamara Silva [id number] DOB: March 07, 1961 DOA: 07/01/2013 PCP: Philis Fendt, MD  Assessment/Plan: Acute on Chronic Systolic and Diastolic heart failure - decompensation likely due to running out of her home Lasix and dietary indiscretion.  - patient on IV Lasix, daily weights, strict I&Os. - weights 143 >> 141 - continue regimen of Carvedilol, Lisinopril, Statin.  - repeat 2D echo without significant change from last year COPD - Duoebs PRN, O2 PRN.  - with mild exacerbation, continue Levofloxacin and Prednisone, taper prednisone slowly - seen in the ED twice in the past week for exacerbation, likely CHF contributing - due to tobacco abuse, quit smoking 2014.  CAD - stable, on Beta Blocker Rx and Plavix Rx.  - no chest pain/tightness or any symptoms similar to her previous MIs. Chronic Pulmonary Embolism- On Xarelto Rx.  HTN - Continue Carvedilol, Lisinopril, and lasix RX. Monitor BPs.  DM2 on Insulin RX - Continue Lantus Rx, and SSI coverage PRN. Check HbA1c in AM.  Hyperlipidemia - Continue Pravastatin Rx.  DVT Rx - on Xarelto Rx.  Trichomonas infection - noted on ED visit 1/25 and prescribed 7 days of Metronidazole, discontinued Metronidazole today after 7 days.   Diet: heart healthy Fluids: none DVT Prophylaxis: Xarelto  Code Status: Full Family Communication: husband bedside  Disposition Plan: home when ready  Consultants:  none  Procedures:  2D echo Study Conclusions - Left ventricle: The cavity size was mildly dilated. Wall thickness was increased in a pattern of mild LVH. Systolic function was moderately reduced. The estimated ejection fraction was in the range of 35% to 40%. There is mild hypokinesis of the mid-distalanteroseptal myocardium. Features are consistent with a pseudonormal left ventricular filling pattern, with concomitant abnormal relaxation and increased filling pressure (grade 2 diastolic dysfunction).   Antibiotics Metronidazole  (prior to admission) 1/25 >> 2/2 Levofloxacin 2/2 >>  HPI/Subjective: - coughing and feeling with chest congestion, but improved overall.   Objective: Filed Vitals:   07/01/13 2127 07/02/13 0524 07/02/13 0802 07/02/13 0841  BP: 133/61 120/68 130/70   Pulse: 75 54 70   Temp: 98.3 F (36.8 C) 97.9 F (36.6 C) 98.2 F (36.8 C)   TempSrc: Oral Oral Oral   Resp: 24 20 20    Height:      Weight:  141.658 kg (312 lb 4.8 oz)    SpO2: 100% 100% 100% 98%    Intake/Output Summary (Last 24 hours) at 07/02/13 1012 Last data filed at 07/02/13 0802  Gross per 24 hour  Intake    720 ml  Output      0 ml  Net    720 ml   Filed Weights   07/01/13 0511 07/02/13 0524  Weight: 143 kg (315 lb 4.1 oz) 141.658 kg (312 lb 4.8 oz)    Exam:   General:  NAD  Cardiovascular: regular rate and rhythm, without MRG;  Respiratory: good air movement, no wheezing, coarse breath sounds at the bases  Abdomen: soft, not tender to palpation, positive bowel sounds  MSK: 1-2+ peripheral edema, abdominal wall edema  Neuro: non focal  Data Reviewed: Basic Metabolic Panel:  Recent Labs Lab 06/29/13 1430 07/01/13 0252 07/02/13 0416  NA 136* 141 141  K 5.0 3.9 4.4  CL 99 101 99  CO2 25  --  33*  GLUCOSE 247* 194* 217*  BUN 16 16 22   CREATININE 0.85 1.00 1.04  CALCIUM 8.5  --  8.6   CBC:  Recent Labs Lab 06/29/13 1430 07/01/13 0252  07/02/13 0416  WBC 10.6*  --  14.1*  HGB 11.4* 12.2 10.9*  HCT 36.3 36.0 35.1*  MCV 80.0  --  81.6  PLT 332  --  335   BNP (last 3 results)  Recent Labs  06/24/13 1838 06/29/13 1430 07/01/13 0235  PROBNP 256.4* 770.4* 1194.0*   CBG:  Recent Labs Lab 07/01/13 0734 07/01/13 1138 07/01/13 1632 07/01/13 2126 07/02/13 0720  GLUCAP 157* 145* 335* 373* 176*   Studies: No results found.  Scheduled Meds: . carvedilol  25 mg Oral BID WC  . clopidogrel  75 mg Oral QAC breakfast  . fluticasone  2 spray Each Nare Daily  . furosemide  100 mg  Intravenous Q12H  . insulin aspart  0-5 Units Subcutaneous QHS  . insulin aspart  0-9 Units Subcutaneous TID WC  . insulin aspart  15 Units Subcutaneous TID WC  . insulin glargine  55 Units Subcutaneous QHS  . ipratropium-albuterol  3 mL Nebulization Q6H  . levofloxacin  500 mg Oral Daily  . lisinopril  20 mg Oral Daily  . pantoprazole  40 mg Oral Daily  . potassium chloride  10 mEq Oral BID  . predniSONE  40 mg Oral QAC breakfast  . Rivaroxaban  20 mg Oral Q supper  . simvastatin  20 mg Oral q1800  . sodium chloride  3 mL Intravenous Q12H  . traZODone  100 mg Oral QHS   Continuous Infusions:   Principal Problem:   Acute on chronic diastolic CHF (congestive heart failure), NYHA class 1 Active Problems:   Coronary artery disease   Chronic pulmonary embolism   Essential hypertension   Hyperlipidemia   Insulin dependent diabetes mellitus   COPD (chronic obstructive pulmonary disease)  Time spent: [place], MD [facility] Pager [phone]. If 7 PM - 7 AM, please contact night-coverage at [email or web address], password [facility] 07/02/2013, 10:12 AM  LOS: 1 day

## 2013-07-02 NOTE — Progress Notes (Signed)
Inpatient Diabetes Program Recommendations  AACE/ADA: New Consensus Statement on Inpatient Glycemic Control (2013)  Target Ranges:  Prepandial:   less than 140 mg/dL      Peak postprandial:   less than 180 mg/dL (1-2 hours)      Critically ill patients:  140 - 180 mg/dL   Reason for Visit: Hyperglycemia  Diabetes history: Type 2 DM Outpatient Diabetes medications: Lantus 55 units QHS and Novolog 15 units tidwc Current orders for Inpatient glycemic control: Same as above with Novolog sensitive tidwc and hs  Results for [name], [name] (MRN [id number]) as of 07/02/2013 11:58  Ref. Range 07/01/2013 16:32 07/01/2013 21:26 07/02/2013 07:20 07/02/2013 11:41  Glucose-Capillary Latest Range: 70-99 mg/dL 335 (H) 373 (H) 176 (H) 130 (H)  Results for [name], [name] (MRN [id number]) as of 07/02/2013 11:58  Ref. Range 06/29/2013 14:30 07/01/2013 02:52 07/02/2013 04:16  Glucose Latest Range: 70-99 mg/dL 247 (H) 194 (H) 217 (H)    Inpatient Diabetes Program Recommendations Insulin - Basal: Consider increasing Lantus to 58 units QHS since FBS is >180 mg/dL Correction (SSI): Increase to resistant tidwc and hs Insulin - Meal Coverage: May be beneficial to increase meal coverage to 18 units with dinner meal since CBGs seem to be most elevated in the evening HgbA1C: 8.9% - uncontrolled Outpatient Referral: Clearly needs OP Diabetes Education and has been referred in the past.  Note: On steroids Prednisone 40 mg/day at home and also inpatient.  Will follow. Lorenda Peck, RD, LDN, CDE Inpatient Diabetes Coordinator [phone]

## 2013-07-02 NOTE — Progress Notes (Signed)
Received referral for [facility] Care Management services. However, noted that patient's primary payor is Medicaid. [facility] Care Management does not currently follow Medicaid. Will make referral to [facility] case management services if patient is not already active with them. Marthenia Rolling, MSN, RN,BSN- [facility] [phone]

## 2013-07-02 NOTE — Progress Notes (Signed)
Spoke with pt concerning CHF/HH programs. Pt states that she would like to go to [facility].Will also check to see if pt qualifies for [facility].

## 2013-07-03 LAB — CBC
HCT: 35.1 % — ABNORMAL LOW (ref 36.0–46.0)
Hemoglobin: 11.1 g/dL — ABNORMAL LOW (ref 12.0–15.0)
MCH: 25.4 pg — ABNORMAL LOW (ref 26.0–34.0)
MCHC: 31.6 g/dL (ref 30.0–36.0)
MCV: 80.3 fL (ref 78.0–100.0)
Platelets: 358 10*3/uL (ref 150–400)
RBC: 4.37 MIL/uL (ref 3.87–5.11)
RDW: 17.2 % — AB (ref 11.5–15.5)
WBC: 14.6 10*3/uL — AB (ref 4.0–10.5)

## 2013-07-03 LAB — BASIC METABOLIC PANEL
BUN: 30 mg/dL — AB (ref 6–23)
CALCIUM: 8.3 mg/dL — AB (ref 8.4–10.5)
CO2: 31 mEq/L (ref 19–32)
Chloride: 97 mEq/L (ref 96–112)
Creatinine, Ser: 0.99 mg/dL (ref 0.50–1.10)
GFR calc non Af Amer: 64 mL/min — ABNORMAL LOW (ref >90)
GFR, EST AFRICAN AMERICAN: 74 mL/min — AB (ref >90)
Glucose, Bld: 370 mg/dL — ABNORMAL HIGH (ref 70–99)
Potassium: 4.4 mEq/L (ref 3.7–5.3)
Sodium: 138 mEq/L (ref 137–147)

## 2013-07-03 LAB — GLUCOSE, CAPILLARY
GLUCOSE-CAPILLARY: 353 mg/dL — AB (ref 70–99)
Glucose-Capillary: 289 mg/dL — ABNORMAL HIGH (ref 70–99)
Glucose-Capillary: 303 mg/dL — ABNORMAL HIGH (ref 70–99)
Glucose-Capillary: 324 mg/dL — ABNORMAL HIGH (ref 70–99)

## 2013-07-03 MED ORDER — FUROSEMIDE 10 MG/ML IJ SOLN
100.0000 mg | Freq: Two times a day (BID) | INTRAVENOUS | Status: DC
  Administered 2013-07-03 – 2013-07-04 (×2): 100 mg via INTRAVENOUS
  Filled 2013-07-03 (×2): qty 10

## 2013-07-03 NOTE — Progress Notes (Signed)
PROGRESS NOTE  Aundra Baisden [id number] DOB: 28-Oct-1960 DOA: 07/01/2013 PCP: Philis Fendt, MD  HPI/Interval Tamara Silva is a 53 y.o. female with Multiple Medical problems including Diastolic CHF, COPD, CAD and DM2 who presents to the ED with complaints of worsening SOB over the past week. She also has had DOE, and Orthopnea, but she denies having chest pain. She has been to the ED several times this week, but has not been able to refill her Lasix and other medications because her insurance will not approve the refill yet. She denies having any fevers or chills, but has had a cough which has been non-productive.   Assessment/Plan: Acute on Chronic Systolic and Diastolic heart failure - decompensation likely due to running out of her home Lasix and dietary indiscretion.  - patient on IV Lasix, daily weights, strict I&Os. - weights 143 >> 141 >> 141 - continue regimen of Carvedilol, Lisinopril, Statin.  - repeat 2D echo without significant change from last year - Continue IV Lasix for today. COPD - Duoebs PRN, O2 PRN.  - with mild exacerbation, continue Levofloxacin and Prednisone, taper prednisone slowly - seen in the ED twice in the past week for exacerbation, likely CHF contributing - due to tobacco abuse, quit smoking 2014.  CAD - stable, on Beta Blocker Rx and Plavix Rx.  - no chest pain/tightness or any symptoms similar to her previous MIs. Chronic Pulmonary Embolism- On Xarelto Rx.  HTN - Continue Carvedilol, Lisinopril, and lasix RX. Monitor BPs.  DM2 on Insulin RX - Continue Lantus Rx, and SSI coverage PRN. Check HbA1c in AM.  Hyperlipidemia - Continue Pravastatin Rx.  DVT Rx - on Xarelto Rx.  Trichomonas infection - noted on ED visit 1/25 and prescribed 7 days of Metronidazole, discontinued Metronidazole today after 7 days.  Constipation - provide an enema today.  Diet: heart healthy, no salt added Fluids: none DVT Prophylaxis: Xarelto  Code Status: Full Family  Communication: None this morning  Disposition Plan: home when ready  Consultants:  none  Procedures:  2D echo Study Conclusions - Left ventricle: The cavity size was mildly dilated. Wall thickness was increased in a pattern of mild LVH. Systolic function was moderately reduced. The estimated ejection fraction was in the range of 35% to 40%. There is mild hypokinesis of the mid-distalanteroseptal myocardium. Features are consistent with a pseudonormal left ventricular filling pattern, with concomitant abnormal relaxation and increased filling pressure (grade 2 diastolic dysfunction).   Antibiotics Metronidazole (prior to admission) 1/25 >> 2/2 Levofloxacin 2/2 >>  HPI/Subjective: - Fitting but at this morning, appreciates lower extremity swelling improving. Feels constipated.  Objective: Filed Vitals:   07/03/13 0130 07/03/13 0456 07/03/13 0735 07/03/13 0821  BP:  107/54 133/77   Pulse:  60 77   Temp:  97.4 F (36.3 C)    TempSrc:  Oral    Resp:  24    Height:      Weight:  141.159 kg (311 lb 3.2 oz)    SpO2: 99% 100% 100% 100%    Intake/Output Summary (Last 24 hours) at 07/03/13 0859 Last data filed at 07/03/13 0730  Gross per 24 hour  Intake    920 ml  Output   3675 ml  Net  -2755 ml   Filed Weights   07/01/13 0511 07/02/13 0524 07/03/13 0456  Weight: 143 kg (315 lb 4.1 oz) 141.658 kg (312 lb 4.8 oz) 141.159 kg (311 lb 3.2 oz)    Exam:   General:  NAD  Cardiovascular:  regular rate and rhythm, without MRG;  Respiratory: good air movement, no wheezing, coarse breath sounds at the bases  Abdomen: soft, not tender to palpation, positive bowel sounds  MSK: 1 + peripheral edema, improved  Neuro: non focal  Data Reviewed: Basic Metabolic Panel:  Recent Labs Lab 06/29/13 1430 07/01/13 0252 07/02/13 0416 07/03/13 0340  NA 136* 141 141 138  K 5.0 3.9 4.4 4.4  CL 99 101 99 97  CO2 25  --  33* 31  GLUCOSE 247* 194* 217* 370*  BUN 16 16 22  30*    CREATININE 0.85 1.00 1.04 0.99  CALCIUM 8.5  --  8.6 8.3*   CBC:  Recent Labs Lab 06/29/13 1430 07/01/13 0252 07/02/13 0416 07/03/13 0340  WBC 10.6*  --  14.1* 14.6*  HGB 11.4* 12.2 10.9* 11.1*  HCT 36.3 36.0 35.1* 35.1*  MCV 80.0  --  81.6 80.3  PLT 332  --  335 358   BNP (last 3 results)  Recent Labs  06/24/13 1838 06/29/13 1430 07/01/13 0235  PROBNP 256.4* 770.4* 1194.0*   CBG:  Recent Labs Lab 07/02/13 0720 07/02/13 1141 07/02/13 1652 07/02/13 2132 07/03/13 0711  GLUCAP 176* 130* 240* 278* 289*   Studies: No results found.  Scheduled Meds: . carvedilol  25 mg Oral BID WC  . clopidogrel  75 mg Oral QAC breakfast  . fluticasone  2 spray Each Nare Daily  . insulin aspart  0-5 Units Subcutaneous QHS  . insulin aspart  0-9 Units Subcutaneous TID WC  . insulin aspart  15 Units Subcutaneous TID WC  . insulin glargine  55 Units Subcutaneous QHS  . ipratropium-albuterol  3 mL Nebulization Q6H  . levofloxacin  500 mg Oral Daily  . lisinopril  20 mg Oral Daily  . pantoprazole  40 mg Oral Daily  . predniSONE  40 mg Oral QAC breakfast  . Rivaroxaban  20 mg Oral Q supper  . simvastatin  20 mg Oral q1800  . sodium chloride  3 mL Intravenous Q12H  . traZODone  100 mg Oral QHS   Continuous Infusions:   Principal Problem:   Acute on chronic diastolic CHF (congestive heart failure), NYHA class 1 Active Problems:   Coronary artery disease   Chronic pulmonary embolism   Essential hypertension   Hyperlipidemia   Insulin dependent diabetes mellitus   COPD (chronic obstructive pulmonary disease)  Time spent: [place], MD [facility] Pager [phone]. If 7 PM - 7 AM, please contact night-coverage at [email or web address], password [facility] 07/03/2013, 8:59 AM  LOS: 2 days

## 2013-07-04 LAB — BASIC METABOLIC PANEL
BUN: 31 mg/dL — AB (ref 6–23)
CALCIUM: 8.7 mg/dL (ref 8.4–10.5)
CHLORIDE: 98 meq/L (ref 96–112)
CO2: 33 mEq/L — ABNORMAL HIGH (ref 19–32)
CREATININE: 1.08 mg/dL (ref 0.50–1.10)
GFR, EST AFRICAN AMERICAN: 67 mL/min — AB (ref >90)
GFR, EST NON AFRICAN AMERICAN: 58 mL/min — AB (ref >90)
Glucose, Bld: 276 mg/dL — ABNORMAL HIGH (ref 70–99)
Potassium: 4.4 mEq/L (ref 3.7–5.3)
Sodium: 138 mEq/L (ref 137–147)

## 2013-07-04 LAB — GLUCOSE, CAPILLARY
GLUCOSE-CAPILLARY: 308 mg/dL — AB (ref 70–99)
GLUCOSE-CAPILLARY: 324 mg/dL — AB (ref 70–99)
GLUCOSE-CAPILLARY: 323 mg/dL — AB (ref 70–99)
Glucose-Capillary: 240 mg/dL — ABNORMAL HIGH (ref 70–99)

## 2013-07-04 MED ORDER — FUROSEMIDE 10 MG/ML IJ SOLN: 40.0000 mg | Freq: Two times a day (BID) | INTRAMUSCULAR | Status: DC

## 2013-07-04 MED ORDER — PREDNISONE 10 MG PO TABS
10.0000 mg | ORAL_TABLET | Freq: Every day | ORAL | Status: DC
  Administered 2013-07-05: 10 mg via ORAL
  Filled 2013-07-04 (×2): qty 1

## 2013-07-04 MED ORDER — FUROSEMIDE 40 MG PO TABS
40.0000 mg | ORAL_TABLET | Freq: Two times a day (BID) | ORAL | Status: DC
  Filled 2013-07-04 (×3): qty 1

## 2013-07-04 MED ORDER — FUROSEMIDE 40 MG PO TABS
40.0000 mg | ORAL_TABLET | Freq: Two times a day (BID) | ORAL | Status: DC
  Administered 2013-07-04 – 2013-07-05 (×2): 40 mg via ORAL
  Filled 2013-07-04 (×4): qty 1

## 2013-07-04 MED ORDER — METOLAZONE 5 MG PO TABS
5.0000 mg | ORAL_TABLET | Freq: Every day | ORAL | Status: DC
Start: 2013-07-04 — End: 2013-07-05
  Administered 2013-07-04 – 2013-07-05 (×2): 5 mg via ORAL
  Filled 2013-07-04 (×2): qty 1

## 2013-07-04 NOTE — Progress Notes (Signed)
PROGRESS NOTE  Tamara Silva [id number] DOB: Feb 23, 1961 DOA: 07/01/2013 PCP: Philis Fendt, MD  HPI/Interval  53 y.o. ? known combined systolic, diastolic CHF, COPD, CAD XX123456 WAKE/2009 REX], Pulmonary embolism 10/21/12 [[facility]] on systemic AC and DM2 who presented to the ED 07/01/13 c DOE,Orthoopnea, no CP and non-compliacne with medical therapy 2/2 to andmaterial resources lack of   Assessment/Plan:  Multifactorial dyspnea See below Acute on Chronic Systolic and Diastolic heart failure  - decompensation likely due to running out of her home Lasix and dietary indiscretion.  -Transitioned to oral Lasix 40 twice a day [usual home doses 80 twice a day] given mild bump in creatinine on 07/04/48 - Added metolazone 5 mg 07/04/48 - weights 143 >> 141 >> 141>> 142-her last recorded outpatient weight was 133 kg 02/08/13 - continue regimen of Carvedilol, Lisinopril, Statin.  - repeat 2D echo without significant change from last year-however need to consider Aldactone in addition [RALES trial] COPD  - Duonebs PRN, ambulatory O2 sat today-does not qualify from my standpoint at rest for oxygen - with mild exacerbation, continue Levofloxacin and Prednisone, taper prednisone slowly - seen in the ED twice in the past week for exacerbation, likely CHF contributing - due to tobacco abuse, quit smoking 2014.  CAD  - stable, on Beta Blocker Rx and Plavix Rx.  - no chest pain/tightness or any symptoms similar to her previous MIs. Chronic Pulmonary Embolism- - On Xarelto Rx.  HTN - Continue Carvedilol, Lisinopril, and lasix RX. Monitor BPs.  DM2 on Insulin RX, A1c 8.9 - Continue Lantus Rx, and SSI coverage PRN. Sugar is elevated secondary to steroid use which has been tapered as of 07/04/13 Hyperlipidemia - Continue Pravastatin Rx.  DVT Rx - on Xarelto Rx.  Trichomonas infection - noted on ED visit 1/25 and prescribed 7 days of Metronidazole, discontinued Metronidazole  07/03/13 Constipation - provide an enema  today.  Diet: heart healthy, no salt added Fluids: none DVT Prophylaxis: Xarelto  Code Status: Full Family Communication: None this morning  Disposition Plan: home when ready  Consultants:  none  Procedures:  2D echo Study Conclusions - Left ventricle: The cavity size was mildly dilated. Wall thickness was increased in a pattern of mild LVH. Systolic function was moderately reduced. The estimated ejection fraction was in the range of 35% to 40%. There is mild hypokinesis of the mid-distalanteroseptal myocardium. Features are consistent with a pseudonormal left ventricular filling pattern, with concomitant abnormal relaxation and increased filling pressure (grade 2 diastolic dysfunction).   Antibiotics Metronidazole (prior to admission) 1/25 >> 2/2 Levofloxacin 2/2 >> 07/06/48  HPI/Subjective:    Objective: Filed Vitals:   07/04/13 0500 07/04/13 0532 07/04/13 0741 07/04/13 0845  BP: 124/68  115/72   Pulse: 96  82   Temp: 98 F (36.7 C)     TempSrc: Oral     Resp: 20  18   Height:      Weight:  142.7 kg (314 lb 9.5 oz)    SpO2: 100%  100% 98%    Intake/Output Summary (Last 24 hours) at 07/04/13 1237 Last data filed at 07/04/13 0825  Gross per 24 hour  Intake    600 ml  Output   3865 ml  Net  -3265 ml   Filed Weights   07/02/13 0524 07/03/13 0456 07/04/13 0532  Weight: 141.658 kg (312 lb 4.8 oz) 141.159 kg (311 lb 3.2 oz) 142.7 kg (314 lb 9.5 oz)    Exam:   General:  NAD  Cardiovascular: regular  rate and rhythm, without MRG;  Respiratory: good air movement, no wheezing, coarse breath sounds at the bases  Abdomen: soft, not tender to palpation, positive bowel sounds  MSK: 1 + peripheral edema, improved  Neuro: non focal  Data Reviewed: Basic Metabolic Panel:  Recent Labs Lab 06/29/13 1430 07/01/13 0252 07/02/13 0416 07/03/13 0340 07/04/13 0349  NA 136* 141 141 138 138  K 5.0 3.9 4.4 4.4 4.4  CL 99 101 99 97 98  CO2 25  --  33* 31 33*    GLUCOSE 247* 194* 217* 370* 276*  BUN 16 16 22  30* 31*  CREATININE 0.85 1.00 1.04 0.99 1.08  CALCIUM 8.5  --  8.6 8.3* 8.7   CBC:  Recent Labs Lab 06/29/13 1430 07/01/13 0252 07/02/13 0416 07/03/13 0340  WBC 10.6*  --  14.1* 14.6*  HGB 11.4* 12.2 10.9* 11.1*  HCT 36.3 36.0 35.1* 35.1*  MCV 80.0  --  81.6 80.3  PLT 332  --  335 358   BNP (last 3 results)  Recent Labs  06/24/13 1838 06/29/13 1430 07/01/13 0235  PROBNP 256.4* 770.4* 1194.0*   CBG:  Recent Labs Lab 07/03/13 1121 07/03/13 1707 07/03/13 2203 07/04/13 0703 07/04/13 1211  GLUCAP 353* 303* 324* 308* 324*   Studies: No results found.  Scheduled Meds: . carvedilol  25 mg Oral BID WC  . clopidogrel  75 mg Oral QAC breakfast  . fluticasone  2 spray Each Nare Daily  . furosemide  40 mg Oral BID  . insulin aspart  0-5 Units Subcutaneous QHS  . insulin aspart  0-9 Units Subcutaneous TID WC  . insulin aspart  15 Units Subcutaneous TID WC  . insulin glargine  55 Units Subcutaneous QHS  . ipratropium-albuterol  3 mL Nebulization Q6H  . levofloxacin  500 mg Oral Daily  . lisinopril  20 mg Oral Daily  . pantoprazole  40 mg Oral Daily  . [START ON 07/05/2013] predniSONE  10 mg Oral QAC breakfast  . Rivaroxaban  20 mg Oral Q supper  . simvastatin  20 mg Oral q1800  . sodium chloride  3 mL Intravenous Q12H  . traZODone  100 mg Oral QHS   Continuous Infusions:   Principal Problem:   Acute on chronic diastolic CHF (congestive heart failure), NYHA class 1 Active Problems:   Coronary artery disease   Chronic pulmonary embolism   Essential hypertension   Hyperlipidemia   Insulin dependent diabetes mellitus   COPD (chronic obstructive pulmonary disease)  Time spent: [place], MD [facility] [phone]   07/04/2013, 12:37 PM  LOS: 3 days

## 2013-07-04 NOTE — Progress Notes (Signed)
Pt ambulated entire lap around unit with stand by assist. O2 sat 91-95% on RA during ambulation. Pt tolerated ambulation well. Stopped twice to rest.

## 2013-07-05 LAB — COMPREHENSIVE METABOLIC PANEL
ALBUMIN: 3 g/dL — AB (ref 3.5–5.2)
ALK PHOS: 118 U/L — AB (ref 39–117)
ALT: 13 U/L (ref 0–35)
AST: 11 U/L (ref 0–37)
BUN: 31 mg/dL — ABNORMAL HIGH (ref 6–23)
CHLORIDE: 94 meq/L — AB (ref 96–112)
CO2: 33 meq/L — AB (ref 19–32)
CREATININE: 1.04 mg/dL (ref 0.50–1.10)
Calcium: 8.7 mg/dL (ref 8.4–10.5)
GFR calc Af Amer: 70 mL/min — ABNORMAL LOW (ref >90)
GFR, EST NON AFRICAN AMERICAN: 60 mL/min — AB (ref >90)
Glucose, Bld: 227 mg/dL — ABNORMAL HIGH (ref 70–99)
POTASSIUM: 3.7 meq/L (ref 3.7–5.3)
Sodium: 139 mEq/L (ref 137–147)
Total Protein: 7.2 g/dL (ref 6.0–8.3)

## 2013-07-05 LAB — GLUCOSE, CAPILLARY
Glucose-Capillary: 183 mg/dL — ABNORMAL HIGH (ref 70–99)
Glucose-Capillary: 216 mg/dL — ABNORMAL HIGH (ref 70–99)

## 2013-07-05 MED ORDER — FUROSEMIDE 40 MG PO TABS: 40.0000 mg | ORAL_TABLET | Freq: Three times a day (TID) | ORAL | Status: DC

## 2013-07-05 MED ORDER — METOLAZONE 5 MG PO TABS: 5.0000 mg | ORAL_TABLET | Freq: Every day | ORAL | Status: DC

## 2013-07-05 NOTE — Discharge Summary (Signed)
Physician Discharge Summary  Tamara Silva [id number] DOB: 12-21-1960 DOA: 07/01/2013  PCP: Philis Fendt, MD  Admit date: 07/01/2013 Discharge date: 07/05/2013  Time spent: 40 minutes  Recommendations for Outpatient Follow-up:  1. Needs complete metabolic panel as well as CBC in about a week 2. Needs consideration for either increasing/decreasing Lasix dosage 3. Counseled extensively on diet control, making sure that she can get her medications, as well as cardiac rehabilitation which is interested in 4. Consider lifelong anticoagulation with dual therapy given history of both CAD and pulmonary embolism  5.    Discharge Diagnoses:  Principal Problem:   Acute on chronic diastolic CHF (congestive heart failure), NYHA class 1 Active Problems:   Coronary artery disease   Chronic pulmonary embolism   Essential hypertension   Hyperlipidemia   Insulin dependent diabetes mellitus   COPD (chronic obstructive pulmonary disease)   Discharge Condition: Stable  Diet recommendation: Heart healthy low-salt diabetic  Filed Weights   07/03/13 0456 07/04/13 0532 07/05/13 0615  Weight: 141.159 kg (311 lb 3.2 oz) 142.7 kg (314 lb 9.5 oz) 142.1 kg (313 lb 4.4 oz)    History of present illness:   53 y.o. ? known combined systolic, diastolic CHF, COPD, CAD XX123456 WAKE/2009 REX], Pulmonary embolism 10/21/12 [[facility]] on systemic AC and DM2 who presented to the ED 07/01/13 c DOE,Orthoopnea, no CP and non-compliacne with medical therapy 2/2 to lack of material resources as she was trying to up titrate the dose of her Lasix at home but ran out of her Lasix and needs to petition social services to allow her to do this.   Hospital Course:  Multifactorial dyspnea  See below  Acute on Chronic Systolic and Diastolic heart failure  - decompensation likely due to running out of her home Lasix and dietary indiscretion.  -Transitioned to oral Lasix 40 twice a day on 07/04/13 -Discharged on Lasix 40 3 times a  day [take 1 dose at midday if she gains over 2-3 pounds] -Close followup counseled with patient  - Added metolazone 5 mg 54/4/54  - weights 143 >> 141 >> 141>> 142-her last recorded outpatient weight was 133 kg 02/08/13.  Weight in hospital felt to be unreliable as she is on prednisone and is eating over and above her usual amount - continue regimen of Carvedilol, Lisinopril, Statin.  - repeat 2D echo without significant change from last year-however need to consider Aldactone in addition [RALES trial] given decreased EF with mild hypokinesis COPD  - Duonebs PRN, ambulatory O2 sat today-does not qualify from my standpoint at rest for oxygen as she ambulated without issue - with mild exacerbation, continue Levofloxacin and Prednisone, tapered off of prednisone by discharge - seen in the ED twice in the past week for exacerbation, likely CHF contributing  - due to tobacco abuse, quit smoking 2014.  CAD  - stable, on Beta Blocker Rx and Plavix Rx.  - no chest pain/tightness or any symptoms similar to her previous MIs.  Chronic Pulmonary Embolism-  - On Xarelto Rx.  -This needs to be determined as an outpatient if she needs lifelong therapy HTN - Continue Carvedilol, Lisinopril, and lasix RX. Monitor BPs.  DM2 on Insulin RX, A1c 8.9 - Continue Lantus Rx, and SSI coverage PRN. Sugar is elevated secondary to steroid use which has been tapered as of 07/04/13 and discontinued completely on discharge 2/5 Hyperlipidemia - Continue Pravastatin Rx.  DVT Rx - on Xarelto Rx.  Trichomonas infection - noted on ED visit 1/25 and prescribed  7 days of Metronidazole, discontinued Metronidazole 07/03/13  Constipation - resolved   Procedures:  2D echo Study Conclusions - Left ventricle: The cavity size was mildly dilated. Wall thickness was increased in a pattern of mild LVH. Systolic function was moderately reduced. The estimated ejection fraction was in the range of 35% to 40%. There is mild hypokinesis of the  mid-distalanteroseptal myocardium. Features are consistent with a pseudonormal left ventricular filling pattern, with concomitant abnormal relaxation and increased filling pressure (grade 2 diastolic dysfunction).  Antibiotics Metronidazole (prior to admission) 1/25 >> 2/2  Levofloxacin 2/2 >> 07/06/48   Discharge Exam: Filed Vitals:   07/05/13 1017  BP: 118/67  Pulse: 71  Temp:   Resp:    Alert pleasant oriented no apparent distress Tolerating diet well resting Lower extremities slightly swollen but much better than prior No oxygen requirement but deconditioned on ambulating 07/04/13  General: EOMI, NCAT Cardiovascular: S1-S2 no murmur rub or gallop Respiratory: Clinically clear no added sound Lower extremities show decrease in lower extremity edema  Discharge Instructions  Discharge Orders   Future Orders Complete By Expires   (HEART FAILURE PATIENTS) Call MD:  Anytime you have any of the following symptoms: 1) 3 pound weight gain in 24 hours or 5 pounds in 1 week 2) shortness of breath, with or without a dry hacking cough 3) swelling in the hands, feet or stomach 4) if you have to sleep on extra pillows at night in order to breathe.  As directed    ACE Inhibitor / ARB already ordered  As directed    Amb Referral to Cardiac Rehabilitation  As directed    Diet - low sodium heart healthy  As directed    Discharge instructions  As directed    Comments:     You will need to followup with her cardiologist Dr. Gwenlyn Found in about 5-10 days to adjust the dosage of her Lasix Please continue Lasix 40 mg up to 3 times a day in addition to the new medication of metolazone 5 mg daily You will need to speak with social services with regards to allowing you to get your medications as needed Continue your Xarelto + Plavix as you have an indication for both. This will need to be determined moving forward in the next 6-9 months whether you still need your Xarelto. Cardiac rehabilitation has been  ordered for you   Heart Failure patients record your daily weight using the same scale at the same time of day  As directed    Increase activity slowly  As directed    Referral to Cardiac Rehab   As directed 07/05/2014   STOP any activity that causes chest pain, shortness of breath, dizziness, sweating, or exessive weakness  As directed        Medication List    STOP taking these medications       cephALEXin 500 MG capsule  Commonly known as:  KEFLEX     metroNIDAZOLE 500 MG tablet  Commonly known as:  FLAGYL     predniSONE 20 MG tablet  Commonly known as:  DELTASONE      TAKE these medications       albuterol (2.5 MG/3ML) 0.083% nebulizer solution  Commonly known as:  PROVENTIL  Take 6 mLs (5 mg total) by nebulization every 4 (four) hours as needed for wheezing or shortness of breath.     albuterol 108 (90 BASE) MCG/ACT inhaler  Commonly known as:  PROVENTIL HFA;VENTOLIN HFA  Inhale 2 puffs  into the lungs every 6 (six) hours as needed for wheezing.     carvedilol 25 MG tablet  Commonly known as:  COREG  Take 1 tablet (25 mg total) by mouth 2 (two) times daily with a meal.     clopidogrel 75 MG tablet  Commonly known as:  PLAVIX  Take 75 mg by mouth daily.     diazepam 10 MG tablet  Commonly known as:  VALIUM  Take 10 mg by mouth every 8 (eight) hours as needed for anxiety.     fluticasone 50 MCG/ACT nasal spray  Commonly known as:  FLONASE  Place 2 sprays into the nose daily.     furosemide 40 MG tablet  Commonly known as:  LASIX  Take 1 tablet (40 mg total) by mouth 3 (three) times daily.     guaiFENesin-codeine 100-10 MG/5ML syrup  Take 5 mLs by mouth every 6 (six) hours as needed for cough.     HUMALOG 100 UNIT/ML injection  Generic drug:  insulin lispro  Inject 15-20 Units into the skin 3 (three) times daily before meals. Inject 15-20 Units subcutaneously 3 (three) times daily before meals.     insulin glargine 100 UNIT/ML injection  Commonly known as:   LANTUS  Inject 55 Units into the skin at bedtime.     ipratropium-albuterol 0.5-2.5 (3) MG/3ML Soln  Commonly known as:  DUONEB  Take 3 mLs by nebulization every 6 (six) hours as needed (wheezing).     lisinopril 20 MG tablet  Commonly known as:  PRINIVIL,ZESTRIL  Take 20 mg by mouth daily.     metolazone 5 MG tablet  Commonly known as:  ZAROXOLYN  Take 1 tablet (5 mg total) by mouth daily.     nitroGLYCERIN 0.4 MG SL tablet  Commonly known as:  NITROSTAT  Place 1 tablet (0.4 mg total) under the tongue every 5 (five) minutes as needed for chest pain.     omeprazole 40 MG capsule  Commonly known as:  PRILOSEC  Take 40 mg by mouth 2 (two) times daily.     potassium chloride 10 MEQ tablet  Commonly known as:  K-DUR  Take 10 mEq by mouth 2 (two) times daily. Take 1 tablet (10 mEq total) by mouth 2 (two) times daily.     pravastatin 40 MG tablet  Commonly known as:  PRAVACHOL  Take 40 mg by mouth daily.     Rivaroxaban 20 MG Tabs tablet  Commonly known as:  XARELTO  Take 1 tablet (20 mg total) by mouth daily with supper.     tiZANidine 4 MG tablet  Commonly known as:  ZANAFLEX  Take 4 mg by mouth every 6 (six) hours as needed for muscle spasms.     traZODone 100 MG tablet  Commonly known as:  DESYREL  Take 100 mg by mouth at bedtime.       Allergies  Allergen Reactions  . Nsaids Other (See Comments)    "I do not take NSAIDs d/t interfering with other meds"       Follow-up Information   Follow up with Philis Fendt, MD.   Specialty:  Internal Medicine   Contact information:   [place] [address] [phone]       Follow up with Lorretta Harp, MD. Schedule an appointment as soon as possible for a visit in 5 days.   Specialty:  Cardiology   Contact information:   [address] [place] [address] [phone]  The results of significant diagnostics from this hospitalization (including imaging,  microbiology, ancillary and laboratory) are listed below for reference.    Significant Diagnostic Studies: Dg Chest 2 View  06/29/2013   CLINICAL DATA:  Shortness of breath, cough.  EXAM: CHEST  2 VIEW  COMPARISON:  06/24/2013  FINDINGS: Heart is normal size. Diffuse interstitial prominence throughout the lungs, stable compatible with chronic interstitial lung disease. Scarring in the left upper lobe. Small bilateral pleural effusions. No acute bony abnormality.  IMPRESSION: Chronic interstitial changes, stable.  Small bilateral pleural effusions.   Electronically Signed   By: Rolm Baptise M.D.   On: 06/29/2013 15:10   Dg Chest 2 View  06/24/2013   CLINICAL DATA:  Shortness of breath.  EXAM: CHEST  2 VIEW  COMPARISON:  11/27/2012  FINDINGS: Lungs are adequately inflated with stable scarring over the left mid to upper lung. There is no focal airspace consolidation or effusion. There is borderline cardiomegaly. Remainder the exam is unchanged.  IMPRESSION: No active cardiopulmonary disease.   Electronically Signed   By: Marin Olp M.D.   On: 06/24/2013 18:20    Microbiology: No results found for this or any previous visit (from the past 240 hour(s)).   Labs: Basic Metabolic Panel:  Recent Labs Lab 06/29/13 1430 07/01/13 0252 07/02/13 0416 07/03/13 0340 07/04/13 0349 07/05/13 0810  NA 136* 141 141 138 138 139  K 5.0 3.9 4.4 4.4 4.4 3.7  CL 99 101 99 97 98 94*  CO2 25  --  33* 31 33* 33*  GLUCOSE 247* 194* 217* 370* 276* 227*  BUN 16 16 22  30* 31* 31*  CREATININE 0.85 1.00 1.04 0.99 1.08 1.04  CALCIUM 8.5  --  8.6 8.3* 8.7 8.7   Liver Function Tests:  Recent Labs Lab 07/05/13 0810  AST 11  ALT 13  ALKPHOS 118*  BILITOT <0.2*  PROT 7.2  ALBUMIN 3.0*   No results found for this basename: LIPASE, AMYLASE,  in the last 168 hours No results found for this basename: AMMONIA,  in the last 168 hours CBC:  Recent Labs Lab 06/29/13 1430 07/01/13 0252 07/02/13 0416  07/03/13 0340  WBC 10.6*  --  14.1* 14.6*  HGB 11.4* 12.2 10.9* 11.1*  HCT 36.3 36.0 35.1* 35.1*  MCV 80.0  --  81.6 80.3  PLT 332  --  335 358   Cardiac Enzymes: No results found for this basename: CKTOTAL, CKMB, CKMBINDEX, TROPONINI,  in the last 168 hours BNP: BNP (last 3 results)  Recent Labs  06/24/13 1838 06/29/13 1430 07/01/13 0235  PROBNP 256.4* 770.4* 1194.0*   CBG:  Recent Labs Lab 07/04/13 0703 07/04/13 1211 07/04/13 1713 07/04/13 2222 07/05/13 0722  GLUCAP 308* 324* 240* 323* 183*       Signed:  Nita Sells  [facility] 07/05/2013, 10:53 AM

## 2013-07-05 NOTE — Progress Notes (Signed)
D/C instructions reviewed w/ pt. Pt verbalizes understanding and has no further questions. Pt d/c in w/c in stable condition by NT. Pt d/c to friend's car in possession of d/c instructions and all personal belongings. Pt scripts called into his pharmacy.

## 2013-07-06 ENCOUNTER — Telehealth: Payer: Self-pay | Admitting: *Deleted

## 2013-07-06 NOTE — Telephone Encounter (Signed)
Pt stated that she just got out of the hospital and she needs to see Dr.  Gwenlyn Found within 5 days. Dr. Gwenlyn Found does not have anything and she refused to see a PA. She needs to have 72 hour notice for Medicaid transportation as well.  JB

## 2013-07-06 NOTE — Telephone Encounter (Signed)
Returned call and pt verified x 2.  Pt informed Dr. Gwenlyn Found does not have any openings until March.  Informed she will have to see a PA for NP for hospital f/u and then f/u with Dr. Gwenlyn Found afterwards.  Pt asked when was she supposed to see him again anyway.  Pt informed she was due for appt in March since she was told to f/u in 6 mos in September.  Pt asked if she an see Dr. Gwenlyn Found at [facility].  Pt informed he does not see patients for clinic appts at [facility]  Informed visits in the hospital are for inpatient services.  Pt verbalized understanding and asked that she be scheduled for an appt and someone call her back b/c she is aggravated right now.    Message forwarded to [facility], Scheduling, to contact pt r/t appt.  Otila Kluver - Please call her back w/ an appt w/ an Extender, preferably on a day/time Dr. Gwenlyn Found is in the office.  Thanks. ~ [employer]

## 2013-07-09 ENCOUNTER — Telehealth: Payer: Self-pay | Admitting: *Deleted

## 2013-07-09 NOTE — Telephone Encounter (Signed)
Pt was called back to make an appointment and she refused to schedule with a PA. I stated that she was told by Luetta Nutting that we did not have anything and that she stated to see a PA when Dr. Gwenlyn Found is here and she stated that she would not see a PA and hung the phone up on me.

## 2013-07-10 ENCOUNTER — Observation Stay (HOSPITAL_COMMUNITY): Payer: Medicaid Other

## 2013-07-10 ENCOUNTER — Encounter (HOSPITAL_COMMUNITY): Payer: Self-pay | Admitting: Emergency Medicine

## 2013-07-10 ENCOUNTER — Inpatient Hospital Stay (HOSPITAL_COMMUNITY)
Admission: EM | Admit: 2013-07-10 | Discharge: 2013-07-16 | DRG: 074 | Disposition: A | Discharge status: Home / Self Care | Payer: Medicaid Other | Attending: Internal Medicine | Admitting: Internal Medicine

## 2013-07-10 DIAGNOSIS — I2589 Other forms of chronic ischemic heart disease: Secondary | ICD-10-CM | POA: Diagnosis present

## 2013-07-10 DIAGNOSIS — T50905A Adverse effect of unspecified drugs, medicaments and biological substances, initial encounter: Secondary | ICD-10-CM

## 2013-07-10 DIAGNOSIS — K5289 Other specified noninfective gastroenteritis and colitis: Secondary | ICD-10-CM | POA: Diagnosis present

## 2013-07-10 DIAGNOSIS — I255 Ischemic cardiomyopathy: Secondary | ICD-10-CM

## 2013-07-10 DIAGNOSIS — Z87891 Personal history of nicotine dependence: Secondary | ICD-10-CM

## 2013-07-10 DIAGNOSIS — Z9989 Dependence on other enabling machines and devices: Secondary | ICD-10-CM

## 2013-07-10 DIAGNOSIS — F319 Bipolar disorder, unspecified: Secondary | ICD-10-CM

## 2013-07-10 DIAGNOSIS — J449 Chronic obstructive pulmonary disease, unspecified: Secondary | ICD-10-CM

## 2013-07-10 DIAGNOSIS — E1149 Type 2 diabetes mellitus with other diabetic neurological complication: Principal | ICD-10-CM | POA: Diagnosis present

## 2013-07-10 DIAGNOSIS — I5042 Chronic combined systolic (congestive) and diastolic (congestive) heart failure: Secondary | ICD-10-CM | POA: Diagnosis present

## 2013-07-10 DIAGNOSIS — Z823 Family history of stroke: Secondary | ICD-10-CM

## 2013-07-10 DIAGNOSIS — R5383 Other fatigue: Secondary | ICD-10-CM | POA: Diagnosis present

## 2013-07-10 DIAGNOSIS — F4310 Post-traumatic stress disorder, unspecified: Secondary | ICD-10-CM | POA: Diagnosis present

## 2013-07-10 DIAGNOSIS — R112 Nausea with vomiting, unspecified: Secondary | ICD-10-CM

## 2013-07-10 DIAGNOSIS — F4311 Post-traumatic stress disorder, acute: Secondary | ICD-10-CM

## 2013-07-10 DIAGNOSIS — Z794 Long term (current) use of insulin: Secondary | ICD-10-CM

## 2013-07-10 DIAGNOSIS — E785 Hyperlipidemia, unspecified: Secondary | ICD-10-CM

## 2013-07-10 DIAGNOSIS — I2782 Chronic pulmonary embolism: Secondary | ICD-10-CM

## 2013-07-10 DIAGNOSIS — K219 Gastro-esophageal reflux disease without esophagitis: Secondary | ICD-10-CM | POA: Diagnosis present

## 2013-07-10 DIAGNOSIS — E860 Dehydration: Secondary | ICD-10-CM

## 2013-07-10 DIAGNOSIS — J45901 Unspecified asthma with (acute) exacerbation: Secondary | ICD-10-CM

## 2013-07-10 DIAGNOSIS — I509 Heart failure, unspecified: Secondary | ICD-10-CM | POA: Diagnosis present

## 2013-07-10 DIAGNOSIS — I5032 Chronic diastolic (congestive) heart failure: Secondary | ICD-10-CM

## 2013-07-10 DIAGNOSIS — G4733 Obstructive sleep apnea (adult) (pediatric): Secondary | ICD-10-CM

## 2013-07-10 DIAGNOSIS — I2510 Atherosclerotic heart disease of native coronary artery without angina pectoris: Secondary | ICD-10-CM

## 2013-07-10 DIAGNOSIS — E119 Type 2 diabetes mellitus without complications: Secondary | ICD-10-CM

## 2013-07-10 DIAGNOSIS — I10 Essential (primary) hypertension: Secondary | ICD-10-CM

## 2013-07-10 DIAGNOSIS — E7800 Pure hypercholesterolemia, unspecified: Secondary | ICD-10-CM | POA: Diagnosis present

## 2013-07-10 DIAGNOSIS — E1142 Type 2 diabetes mellitus with diabetic polyneuropathy: Secondary | ICD-10-CM | POA: Diagnosis present

## 2013-07-10 DIAGNOSIS — E1143 Type 2 diabetes mellitus with diabetic autonomic (poly)neuropathy: Secondary | ICD-10-CM

## 2013-07-10 DIAGNOSIS — Z6841 Body Mass Index (BMI) 40.0 and over, adult: Secondary | ICD-10-CM

## 2013-07-10 DIAGNOSIS — K3184 Gastroparesis: Secondary | ICD-10-CM | POA: Diagnosis present

## 2013-07-10 DIAGNOSIS — I5033 Acute on chronic diastolic (congestive) heart failure: Secondary | ICD-10-CM

## 2013-07-10 DIAGNOSIS — IMO0001 Reserved for inherently not codable concepts without codable children: Secondary | ICD-10-CM

## 2013-07-10 DIAGNOSIS — R5381 Other malaise: Secondary | ICD-10-CM | POA: Diagnosis present

## 2013-07-10 DIAGNOSIS — Z9861 Coronary angioplasty status: Secondary | ICD-10-CM

## 2013-07-10 DIAGNOSIS — J441 Chronic obstructive pulmonary disease with (acute) exacerbation: Secondary | ICD-10-CM | POA: Diagnosis present

## 2013-07-10 DIAGNOSIS — Z7901 Long term (current) use of anticoagulants: Secondary | ICD-10-CM

## 2013-07-10 DIAGNOSIS — N179 Acute kidney failure, unspecified: Secondary | ICD-10-CM

## 2013-07-10 LAB — CBC WITH DIFFERENTIAL/PLATELET
Basophils Absolute: 0 10*3/uL (ref 0.0–0.1)
Basophils Relative: 0 % (ref 0–1)
EOS ABS: 0.1 10*3/uL (ref 0.0–0.7)
EOS PCT: 1 % (ref 0–5)
HCT: 38 % (ref 36.0–46.0)
Hemoglobin: 12.3 g/dL (ref 12.0–15.0)
LYMPHS ABS: 2.9 10*3/uL (ref 0.7–4.0)
LYMPHS PCT: 28 % (ref 12–46)
MCH: 25.6 pg — ABNORMAL LOW (ref 26.0–34.0)
MCHC: 32.4 g/dL (ref 30.0–36.0)
MCV: 79.2 fL (ref 78.0–100.0)
MONOS PCT: 10 % (ref 3–12)
Monocytes Absolute: 1 10*3/uL (ref 0.1–1.0)
Neutro Abs: 6.2 10*3/uL (ref 1.7–7.7)
Neutrophils Relative %: 61 % (ref 43–77)
PLATELETS: 334 10*3/uL (ref 150–400)
RBC: 4.8 MIL/uL (ref 3.87–5.11)
RDW: 16.6 % — ABNORMAL HIGH (ref 11.5–15.5)
WBC: 10.2 10*3/uL (ref 4.0–10.5)

## 2013-07-10 LAB — GLUCOSE, CAPILLARY
GLUCOSE-CAPILLARY: 204 mg/dL — AB (ref 70–99)
Glucose-Capillary: 190 mg/dL — ABNORMAL HIGH (ref 70–99)
Glucose-Capillary: 215 mg/dL — ABNORMAL HIGH (ref 70–99)
Glucose-Capillary: 230 mg/dL — ABNORMAL HIGH (ref 70–99)
Glucose-Capillary: 281 mg/dL — ABNORMAL HIGH (ref 70–99)

## 2013-07-10 LAB — COMPREHENSIVE METABOLIC PANEL
ALT: 20 U/L (ref 0–35)
AST: 22 U/L (ref 0–37)
Albumin: 3.3 g/dL — ABNORMAL LOW (ref 3.5–5.2)
Alkaline Phosphatase: 86 U/L (ref 39–117)
BILIRUBIN TOTAL: 0.2 mg/dL — AB (ref 0.3–1.2)
BUN: 58 mg/dL — ABNORMAL HIGH (ref 6–23)
CO2: 31 meq/L (ref 19–32)
CREATININE: 1.59 mg/dL — AB (ref 0.50–1.10)
Calcium: 8.5 mg/dL (ref 8.4–10.5)
Chloride: 88 mEq/L — ABNORMAL LOW (ref 96–112)
GFR calc Af Amer: 42 mL/min — ABNORMAL LOW (ref >90)
GFR, EST NON AFRICAN AMERICAN: 36 mL/min — AB (ref >90)
Glucose, Bld: 246 mg/dL — ABNORMAL HIGH (ref 70–99)
Potassium: 4.5 mEq/L (ref 3.7–5.3)
Sodium: 132 mEq/L — ABNORMAL LOW (ref 137–147)
Total Protein: 7.6 g/dL (ref 6.0–8.3)

## 2013-07-10 LAB — BASIC METABOLIC PANEL
BUN: 63 mg/dL — AB (ref 6–23)
CO2: 35 meq/L — AB (ref 19–32)
Calcium: 8.6 mg/dL (ref 8.4–10.5)
Chloride: 85 mEq/L — ABNORMAL LOW (ref 96–112)
Creatinine, Ser: 2.04 mg/dL — ABNORMAL HIGH (ref 0.50–1.10)
GFR calc Af Amer: 31 mL/min — ABNORMAL LOW (ref >90)
GFR calc non Af Amer: 27 mL/min — ABNORMAL LOW (ref >90)
Glucose, Bld: 192 mg/dL — ABNORMAL HIGH (ref 70–99)
Potassium: 4.3 mEq/L (ref 3.7–5.3)
SODIUM: 131 meq/L — AB (ref 137–147)

## 2013-07-10 LAB — AMYLASE: Amylase: 19 U/L (ref 0–105)

## 2013-07-10 LAB — BLOOD GAS, VENOUS
Acid-Base Excess: 9.7 mmol/L — ABNORMAL HIGH (ref 0.0–2.0)
BICARBONATE: 36.4 meq/L — AB (ref 20.0–24.0)
FIO2: 0.21 %
O2 SAT: 34.5 %
PO2 VEN: 0 mmHg — AB (ref 30.0–45.0)
Patient temperature: 98.6
TCO2: 33.3 mmol/L (ref 0–100)
pCO2, Ven: 61.1 mmHg — ABNORMAL HIGH (ref 45.0–50.0)
pH, Ven: 7.392 — ABNORMAL HIGH (ref 7.250–7.300)

## 2013-07-10 LAB — LIPASE, BLOOD: Lipase: 25 U/L (ref 11–59)

## 2013-07-10 MED ORDER — PANTOPRAZOLE SODIUM 40 MG IV SOLR
40.0000 mg | Freq: Two times a day (BID) | INTRAVENOUS | Status: DC
  Administered 2013-07-10 – 2013-07-16 (×11): 40 mg via INTRAVENOUS
  Filled 2013-07-10 (×15): qty 40

## 2013-07-10 MED ORDER — INSULIN GLARGINE 100 UNIT/ML ~~LOC~~ SOLN
55.0000 [IU] | Freq: Every day | SUBCUTANEOUS | Status: DC
Start: 2013-07-10 — End: 2013-07-16
  Administered 2013-07-10 – 2013-07-16 (×6): 55 [IU] via SUBCUTANEOUS
  Filled 2013-07-10 (×7): qty 0.55

## 2013-07-10 MED ORDER — SODIUM CHLORIDE 0.9 % IV SOLN
INTRAVENOUS | Status: AC
  Administered 2013-07-10: 12:00:00 via INTRAVENOUS

## 2013-07-10 MED ORDER — TIZANIDINE HCL 2 MG PO TABS
2.0000 mg | ORAL_TABLET | Freq: Four times a day (QID) | ORAL | Status: DC | PRN
  Administered 2013-07-12: 2 mg via ORAL
  Filled 2013-07-10 (×2): qty 1

## 2013-07-10 MED ORDER — ALBUTEROL SULFATE HFA 108 (90 BASE) MCG/ACT IN AERS: 2.0000 | INHALATION_SPRAY | Freq: Four times a day (QID) | RESPIRATORY_TRACT | Status: DC | PRN

## 2013-07-10 MED ORDER — INSULIN ASPART 100 UNIT/ML ~~LOC~~ SOLN
0.0000 [IU] | Freq: Three times a day (TID) | SUBCUTANEOUS | Status: DC
  Administered 2013-07-10: 8 [IU] via SUBCUTANEOUS
  Administered 2013-07-10 – 2013-07-11 (×4): 5 [IU] via SUBCUTANEOUS

## 2013-07-10 MED ORDER — IPRATROPIUM-ALBUTEROL 0.5-2.5 (3) MG/3ML IN SOLN
3.0000 mL | Freq: Four times a day (QID) | RESPIRATORY_TRACT | Status: DC | PRN
  Administered 2013-07-10 (×2): 3 mL via RESPIRATORY_TRACT
  Filled 2013-07-10 (×2): qty 3

## 2013-07-10 MED ORDER — TRAZODONE HCL 100 MG PO TABS
100.0000 mg | ORAL_TABLET | Freq: Every day | ORAL | Status: DC
  Administered 2013-07-10 – 2013-07-16 (×6): 100 mg via ORAL
  Filled 2013-07-10 (×7): qty 1

## 2013-07-10 MED ORDER — CARVEDILOL 25 MG PO TABS
25.0000 mg | ORAL_TABLET | Freq: Two times a day (BID) | ORAL | Status: DC
  Administered 2013-07-10 – 2013-07-16 (×12): 25 mg via ORAL
  Filled 2013-07-10 (×16): qty 1

## 2013-07-10 MED ORDER — RIVAROXABAN 20 MG PO TABS
20.0000 mg | ORAL_TABLET | Freq: Every day | ORAL | Status: DC
  Administered 2013-07-10 – 2013-07-15 (×6): 20 mg via ORAL
  Filled 2013-07-10 (×7): qty 1

## 2013-07-10 MED ORDER — INSULIN ASPART 100 UNIT/ML ~~LOC~~ SOLN
0.0000 [IU] | Freq: Every day | SUBCUTANEOUS | Status: DC
  Administered 2013-07-10: 2 [IU] via SUBCUTANEOUS

## 2013-07-10 MED ORDER — ONDANSETRON 8 MG/NS 50 ML IVPB
8.0000 mg | Freq: Three times a day (TID) | INTRAVENOUS | Status: DC | PRN
  Administered 2013-07-10 – 2013-07-13 (×4): 8 mg via INTRAVENOUS
  Filled 2013-07-10 (×4): qty 8

## 2013-07-10 MED ORDER — SODIUM CHLORIDE 0.9 % IJ SOLN
3.0000 mL | Freq: Two times a day (BID) | INTRAMUSCULAR | Status: DC
  Administered 2013-07-10 – 2013-07-16 (×5): 3 mL via INTRAVENOUS

## 2013-07-10 MED ORDER — SODIUM CHLORIDE 0.9 % IV BOLUS (SEPSIS)
250.0000 mL | Freq: Once | INTRAVENOUS | Status: AC
  Administered 2013-07-10: 250 mL via INTRAVENOUS

## 2013-07-10 MED ORDER — CLOPIDOGREL BISULFATE 75 MG PO TABS
75.0000 mg | ORAL_TABLET | Freq: Every day | ORAL | Status: DC
  Administered 2013-07-10 – 2013-07-11 (×2): 75 mg via ORAL
  Filled 2013-07-10 (×2): qty 1

## 2013-07-10 MED ORDER — ONDANSETRON HCL 4 MG/2ML IJ SOLN
4.0000 mg | Freq: Once | INTRAMUSCULAR | Status: AC
  Administered 2013-07-10: 4 mg via INTRAVENOUS
  Filled 2013-07-10: qty 2

## 2013-07-10 MED ORDER — FLUTICASONE PROPIONATE 50 MCG/ACT NA SUSP
2.0000 | Freq: Every day | NASAL | Status: DC
  Administered 2013-07-10 – 2013-07-16 (×7): 2 via NASAL
  Filled 2013-07-10: qty 16

## 2013-07-10 MED ORDER — ALBUTEROL SULFATE (2.5 MG/3ML) 0.083% IN NEBU
5.0000 mg | INHALATION_SOLUTION | RESPIRATORY_TRACT | Status: DC | PRN
  Administered 2013-07-11: 5 mg via RESPIRATORY_TRACT
  Filled 2013-07-10: qty 6

## 2013-07-10 MED ORDER — DIAZEPAM 5 MG PO TABS
5.0000 mg | ORAL_TABLET | Freq: Three times a day (TID) | ORAL | Status: DC | PRN
  Administered 2013-07-11 – 2013-07-14 (×8): 5 mg via ORAL
  Filled 2013-07-10 (×8): qty 1

## 2013-07-10 MED ORDER — IPRATROPIUM-ALBUTEROL 0.5-2.5 (3) MG/3ML IN SOLN
3.0000 mL | Freq: Once | RESPIRATORY_TRACT | Status: AC
  Administered 2013-07-10: 3 mL via RESPIRATORY_TRACT
  Filled 2013-07-10: qty 3

## 2013-07-10 MED ORDER — NITROGLYCERIN 0.4 MG SL SUBL: 0.4000 mg | SUBLINGUAL_TABLET | SUBLINGUAL | Status: DC | PRN

## 2013-07-10 MED ORDER — SIMVASTATIN 40 MG PO TABS
40.0000 mg | ORAL_TABLET | Freq: Every day | ORAL | Status: DC
  Administered 2013-07-10 – 2013-07-15 (×6): 40 mg via ORAL
  Filled 2013-07-10 (×8): qty 1

## 2013-07-10 NOTE — H&P (Signed)
[facility] History and Physical  Patient: Tamara Silva  [id number]  DOB: [birth year]  DOS: the patient was seen and examined on 07/10/2013 PCP: Philis Fendt, MD  Chief Complaint: Nausea and vomiting  HPI: [name] is a 53 y.o. female with Past medical history of chronic diastolic dysfunction, pulmonary embolism, diabetes mellitus, hypertension, COPD, coronary artery disease, morbid obesity, obstructive sleep apnea. The patient is coming from home. The patient presented with complaints of nausea and vomiting .she mentions that she was given one dose of metolazone in the hospital and then discharged on the same home.after taking the medication at home he started having episodes of nausea and vomiting.she had nonbloody with this vomiting x5 last 24 hours associated with acid reflux.she denies any fever, chills, headache, cough, chest pain, palpitation, shortness of breath, orthopnea, PND, diarrhea, constipation, active bleeding, burning urination, dizziness, pedal edema,  focal neurological deficit.  She continues to take the medication despite her nausea.she has poor appetite. She has lost 10 pounds since discharge.  Review of Systems: as mentioned in the history of present illness.  A Comprehensive review of the other systems is negative.  Past Medical History  Diagnosis Date  . Diabetes mellitus without complication   . PE (pulmonary embolism)   . Hypercholesterolemia   . Hypertension   . COPD (chronic obstructive pulmonary disease)   . Asthma   . Coronary artery disease   . CHF (congestive heart failure)   . Mental disorder     BIPOLAR  . Shortness of breath   . GERD (gastroesophageal reflux disease)   . Neuromuscular disorder     DIABETIC NEUROPATHY  . Ischemic cardiomyopathy   . Bipolar disorder   . Post-traumatic stress syndrome    Past Surgical History  Procedure Laterality Date  . Coronary angioplasty with stent placement      CAD in 2006 x 2 and  2009 2 more- place din REx in [place] and  med   Social History:  reports that she quit smoking about 7 months ago. Her smoking use included Cigarettes. She smoked 0.00 packs per day. She has never used smokeless tobacco. She reports that she does not drink alcohol or use illicit drugs. Independent for most of her  ADL.  Allergies  Allergen Reactions  . Nsaids Other (See Comments)    "I do not take NSAIDs d/t interfering with other meds"    Family History  Problem Relation Age of Onset  . Stroke Mother   . Cancer Father     Prior to Admission medications   Medication Sig Start Date End Date Taking? Authorizing Provider  albuterol (PROVENTIL HFA;VENTOLIN HFA) 108 (90 BASE) MCG/ACT inhaler Inhale 2 puffs into the lungs every 6 (six) hours as needed for wheezing. 06/29/13  Yes Kathalene Frames, MD  albuterol (PROVENTIL) (2.5 MG/3ML) 0.083% nebulizer solution Take 6 mLs (5 mg total) by nebulization every 4 (four) hours as needed for wheezing or shortness of breath. 06/24/13  Yes Jennifer L Piepenbrink, PA-C  carvedilol (COREG) 25 MG tablet Take 1 tablet (25 mg total) by mouth 2 (two) times daily with a meal. 06/29/13  Yes Kathalene Frames, MD  clopidogrel (PLAVIX) 75 MG tablet Take 75 mg by mouth daily.   Yes Historical Provider, MD  diazepam (VALIUM) 10 MG tablet Take 10 mg by mouth every 8 (eight) hours as needed for anxiety.   Yes Historical Provider, MD  fluticasone (FLONASE) 50 MCG/ACT nasal spray Place 2 sprays into the nose daily.  Yes Historical Provider, MD  furosemide (LASIX) 40 MG tablet Take 1 tablet (40 mg total) by mouth 3 (three) times daily. 07/05/13  Yes Nita Sells, MD  insulin glargine (LANTUS) 100 UNIT/ML injection Inject 55 Units into the skin at bedtime. 11/28/12  Yes Nita Sells, MD  insulin lispro (HUMALOG) 100 UNIT/ML injection Inject 15-20 Units into the skin 3 (three) times daily before meals. Inject 15-20 Units subcutaneously 3 (three) times daily before meals.  10/21/12  Yes Historical Provider, MD  lisinopril (PRINIVIL,ZESTRIL) 20 MG tablet Take 20 mg by mouth daily.   Yes Historical Provider, MD  metolazone (ZAROXOLYN) 5 MG tablet Take 1 tablet (5 mg total) by mouth daily. 07/05/13  Yes Nita Sells, MD  omeprazole (PRILOSEC) 40 MG capsule Take 40 mg by mouth 2 (two) times daily.   Yes Historical Provider, MD  potassium chloride (K-DUR) 10 MEQ tablet Take 10 mEq by mouth 2 (two) times daily. Take 1 tablet (10 mEq total) by mouth 2 (two) times daily. 10/21/12 10/21/13 Yes Historical Provider, MD  pravastatin (PRAVACHOL) 40 MG tablet Take 40 mg by mouth daily.   Yes Historical Provider, MD  Rivaroxaban (XARELTO) 20 MG TABS Take 1 tablet (20 mg total) by mouth daily with supper. 11/28/12  Yes Nita Sells, MD  tiZANidine (ZANAFLEX) 4 MG tablet Take 4 mg by mouth every 6 (six) hours as needed for muscle spasms.   Yes Historical Provider, MD  traZODone (DESYREL) 100 MG tablet Take 100 mg by mouth at bedtime.   Yes Historical Provider, MD  ipratropium-albuterol (DUONEB) 0.5-2.5 (3) MG/3ML SOLN Take 3 mLs by nebulization every 6 (six) hours as needed (wheezing). 06/29/13   Kathalene Frames, MD  nitroGLYCERIN (NITROSTAT) 0.4 MG SL tablet Place 1 tablet (0.4 mg total) under the tongue every 5 (five) minutes as needed for chest pain. 02/08/13   Lorretta Harp, MD    Physical Exam: Filed Vitals:   07/10/13 0009 07/10/13 0331 07/10/13 0554 07/10/13 0635  BP: 100/60  107/55 109/60  Pulse:   72 74  Temp:    97.9 F (36.6 C)  TempSrc:    Oral  Resp:   18 18  Height:    5' 7.5" (1.715 m)  Weight:    137.44 kg (303 lb)  SpO2:  93% 95% 97%    General: Alert, Awake and Oriented to Time, Place and Person. Appear in mild distress Eyes: PERRL ENT: Oral Mucosa clear moist. Neck: Difficult to assess JVD Cardiovascular: S1 and S2 Present, no Murmur, Peripheral Pulses Present Respiratory: Bilateral Air entry equal and Decreased, Clear to Auscultation,  no  Crackles, no wheezes Abdomen: Bowel Sound Present, Soft and diffuse minimally tender, no guarding no rigidity Skin: no Rash Extremities: no Pedal edema, no calf tenderness Neurologic: Grossly Unremarkable. Labs on Admission:  CBC:  Recent Labs Lab 07/10/13 0310  WBC 10.2  NEUTROABS 6.2  HGB 12.3  HCT 38.0  MCV 79.2  PLT 334    CMP     Component Value Date/Time   NA 131* 07/10/2013 0310   K 4.3 07/10/2013 0310   CL 85* 07/10/2013 0310   CO2 35* 07/10/2013 0310   GLUCOSE 192* 07/10/2013 0310   BUN 63* 07/10/2013 0310   CREATININE 2.04* 07/10/2013 0310   CALCIUM 8.6 07/10/2013 0310   PROT 7.2 07/05/2013 0810   ALBUMIN 3.0* 07/05/2013 0810   AST 11 07/05/2013 0810   ALT 13 07/05/2013 0810   ALKPHOS 118* 07/05/2013 0810   BILITOT <0.2*  07/05/2013 0810   GFRNONAA 27* 07/10/2013 0310   GFRAA 31* 07/10/2013 0310    Assessment/Plan Principal Problem:   Intractable nausea and vomiting Active Problems:   Chronic anticoagulation   Coronary artery disease   Chronic pulmonary embolism   Essential hypertension   Acute renal failure   Chronic diastolic congestive heart failure   1. Intractable nausea and vomiting The patient is complaints of intractable nausea and vomiting with poor appetite and acid reflux. This appears to be a side effect of Zaroxolyn for her at present I will keep in the hospital keep her n.p.o. IV Zofran and Protonix. i will check x-ray abdomen and lipase.  2. acute kidney injury The patient is discharged serum creatinine was 1, today it is 2. More likely etiology is prerenal due to dehydration secondary to nausea and vomiting with poor appetite and continued use of diuretics. She is given IV fluid bolus in ED. I would hold off her Lasix and Zaroxolyn, also hold lisinopril. Check bladder scan. Repeat her BMP to monitor serum creatinine and electrolytes. At present she does not have any significant electrolyte abnormality. Avoid nephrotoxic medications Reducing the dose  of benzodiazepines  3. history of PE Continue Xarelto per pharmacy  4.Hypertension and chronic diastolic dysfunction   continue Coreg  5. Coronary artery disease At present asymptomatic continue Plavix  6. diabetes mellitus Patient on sliding scale. Gradual advancement of diet as tolerated, continue home Lantus dose from tonight  DVT Prophylaxis: Xarelto  Nutrition: N.p.o. at present advance as tolerated   Code Status: Full   Disposition: Admitted to observation in telemetry unit.  Author: Berle Mull, MD [facility] Pager: [phone] 07/10/2013, 6:42 AM    If 7PM-7AM, please contact night-coverage [email or web address] Password [email or web address]

## 2013-07-10 NOTE — Progress Notes (Signed)
UR completed 

## 2013-07-10 NOTE — ED Notes (Signed)
Bed: WA22 Expected date:  Expected time:  Means of arrival:  Comments: EMS 

## 2013-07-10 NOTE — Progress Notes (Signed)
ANTICOAGULATION CONSULT NOTE - Initial Consult  Pharmacy Consult for Xarelto Indication: Hx PE  Allergies  Allergen Reactions  . Nsaids Other (See Comments)    "I do not take NSAIDs d/t interfering with other meds"  . Metolazone Nausea And Vomiting    Side effect.    Patient Measurements: Height: 5' 7.5" (171.5 cm) Weight: 303 lb (137.44 kg) IBW/kg (Calculated) : 62.75  Vital Signs: Temp: 97.9 F (36.6 C) (02/10 0635) Temp src: Oral (02/10 0635) BP: 109/60 mmHg (02/10 0635) Pulse Rate: 74 (02/10 0635)  Labs:  Recent Labs  07/10/13 0310  HGB 12.3  HCT 38.0  PLT 334  CREATININE 2.04*    Estimated Creatinine Clearance: 46.6 ml/min (by C-G formula based on Cr of 2.04).   Medical History: Past Medical History  Diagnosis Date  . Diabetes mellitus without complication   . PE (pulmonary embolism)   . Hypercholesterolemia   . Hypertension   . COPD (chronic obstructive pulmonary disease)   . Asthma   . Coronary artery disease   . CHF (congestive heart failure)   . Mental disorder     BIPOLAR  . Shortness of breath   . GERD (gastroesophageal reflux disease)   . Neuromuscular disorder     DIABETIC NEUROPATHY  . Ischemic cardiomyopathy   . Bipolar disorder   . Post-traumatic stress syndrome     Medications:  Scheduled:  . carvedilol  25 mg Oral BID WC  . clopidogrel  75 mg Oral Daily  . fluticasone  2 spray Each Nare Daily  . insulin aspart  0-15 Units Subcutaneous TID WC  . insulin aspart  0-5 Units Subcutaneous QHS  . insulin glargine  55 Units Subcutaneous QHS  . pantoprazole (PROTONIX) IV  40 mg Intravenous Q12H  . simvastatin  40 mg Oral q1800  . sodium chloride  3 mL Intravenous Q12H  . traZODone  100 mg Oral QHS    Assessment: 53 yo female on chronic xarelto for history of PE. Admitted 2/10 with nausea and vomiting, possibly adverse effect of new medication start.  SCr currently elevated at 2.04 (was 1.04 on 2/5), possibly due to dehydration.  CrCl ~45 ml/min  CBC ok, no bleeding reported  Concurrent plavix noted with hx MI and stents  No drug interactions noted  Goal of Therapy:  Therapeutic anticoagulation Monitor platelets by anticoagulation protocol: Yes   Plan:   Resume Xarelto 20mg  PO daily with supper  Monitor renal function closely - if CrCl falls < 75ml/hr, xarelto use should be avoided  Peggyann Juba, PharmD, BCPS Pager: [phone] 07/10/2013,7:21 AM

## 2013-07-10 NOTE — ED Notes (Signed)
Per EMS pt presents d/t nausea.

## 2013-07-10 NOTE — Care Management Note (Addendum)
    Page 1 of 2   07/16/2013     2:07:49 PM   CARE MANAGEMENT NOTE 07/16/2013  Patient:  [facility]   Account Number:  [id number]  Date Initiated:  07/10/2013  Documentation initiated by:  Dessa Phi  Subjective/Objective Assessment:   53 Y/O F ADMITTED W/INTRACTABLE N/V.     Action/Plan:   FROM HOME.   Anticipated DC Date:  07/16/2013   Anticipated DC Plan:  [place]  CM consult      Choice offered to / List presented to:  C-1 Patient        [place] arranged  HH-1 RN  [place].   Status of service:  Completed, signed off Medicare Important Message given?   (If response is "NO", the following Medicare IM given date fields will be blank) Date Medicare IM given:   Date Additional Medicare IM given:    Discharge Disposition:  [place]  Per UR Regulation:  Reviewed for med. necessity/level of care/duration of stay  If discussed at [place] of Stay Meetings, dates discussed:    Comments:  07/16/13 Tilly Pernice RN,BSN NCM [place].  07/15/2013 1520 NCM spoke to pt and states she has Medicaid. She does not need assistance with her meds. She goes to Dr. Karie Chimera, PCP. Spoke to Dr Sherral Hammers and will evaluate pt's need for [facility] at dc. No orders for [facility] at this time.   Jonnie Finner RN CCM Case Mgmt phone [phone]  07/13/13 Brailynn Breth RN,BSN NCM [phone] FOR GASTRIC EMPTYING STUDY.PICC.PSYCH-OTPT RESOURCES.[facility] ALREADY FOLLOWING FOR HHC ORDERS-HHRN/AIDE/SW.  07/12/13 Keren Alverio RN,BSN NCM [place].CHOSE [place] [facility].TC [facility] REP KRISTEN AWARE OF REFERRAL.RECOMMEND HHRN/NURSE'S AIDE,SW.AWAIT FINAL HH ORDERS.  07/10/13 Shakedra Beam RN,BSN NCM [phone]

## 2013-07-10 NOTE — ED Provider Notes (Signed)
CSN: [id number]     Arrival date & time 07/10/13  0000 History   First MD Initiated Contact with Patient 07/10/13 0150     Chief Complaint  Patient presents with  . Nausea     (Consider location/radiation/quality/duration/timing/severity/associated sxs/prior Treatment) HPI Comments: 53 year old female, history of recently starting on Zaroxolyn, in the hospital several days ago, discharged but has had persistent nausea and vomiting. Nothing makes it better or worse, the medications given prior to arrival, was noted to be hypotensive on arrival and feeling very thirsty and dehydrated. Symptoms are severe  The history is provided by the patient.    Past Medical History  Diagnosis Date  . Diabetes mellitus without complication   . PE (pulmonary embolism)   . Hypercholesterolemia   . Hypertension   . COPD (chronic obstructive pulmonary disease)   . Asthma   . Coronary artery disease   . CHF (congestive heart failure)   . Mental disorder     BIPOLAR  . Shortness of breath   . GERD (gastroesophageal reflux disease)   . Neuromuscular disorder     DIABETIC NEUROPATHY  . Ischemic cardiomyopathy   . Bipolar disorder   . Post-traumatic stress syndrome    Past Surgical History  Procedure Laterality Date  . Coronary angioplasty with stent placement      CAD in 2006 x 2 and 2009 2 more- place din REx in [place] and [place] med   Family History  Problem Relation Age of Onset  . Stroke Mother   . Cancer Father    History  Substance Use Topics  . Smoking status: Former Smoker    Types: Cigarettes    Quit date: 11/24/2012  . Smokeless tobacco: Never Used  . Alcohol Use: No   OB History   Grav Para Term Preterm Abortions TAB SAB Ect Mult Living                 Review of Systems  Constitutional: Negative for fever and chills.  Respiratory: Positive for shortness of breath and wheezing.   Gastrointestinal: Positive for nausea, vomiting and abdominal pain. Negative for diarrhea and  constipation.  Musculoskeletal: Negative for back pain and myalgias.  Skin: Negative for rash and wound.  Neurological: Negative for dizziness and headaches.  All other systems reviewed and are negative.      Allergies  Nsaids  Home Medications   Current Outpatient Rx  Name  Route  Sig  Dispense  Refill  . albuterol (PROVENTIL HFA;VENTOLIN HFA) 108 (90 BASE) MCG/ACT inhaler   Inhalation   Inhale 2 puffs into the lungs every 6 (six) hours as needed for wheezing.         [name] Kitchen albuterol (PROVENTIL) (2.5 MG/3ML) 0.083% nebulizer solution   Nebulization   Take 6 mLs (5 mg total) by nebulization every 4 (four) hours as needed for wheezing or shortness of breath.   75 mL   12   . carvedilol (COREG) 25 MG tablet   Oral   Take 1 tablet (25 mg total) by mouth 2 (two) times daily with a meal.   60 tablet   1   . clopidogrel (PLAVIX) 75 MG tablet   Oral   Take 75 mg by mouth daily.         . diazepam (VALIUM) 10 MG tablet   Oral   Take 10 mg by mouth every 8 (eight) hours as needed for anxiety.         . fluticasone (FLONASE) 50  MCG/ACT nasal spray   Nasal   Place 2 sprays into the nose daily.         . furosemide (LASIX) 40 MG tablet   Oral   Take 1 tablet (40 mg total) by mouth 3 (three) times daily.   90 tablet   0   . insulin glargine (LANTUS) 100 UNIT/ML injection   Subcutaneous   Inject 55 Units into the skin at bedtime.         . insulin lispro (HUMALOG) 100 UNIT/ML injection   Subcutaneous   Inject 15-20 Units into the skin 3 (three) times daily before meals. Inject 15-20 Units subcutaneously 3 (three) times daily before meals.         [name] Kitchen lisinopril (PRINIVIL,ZESTRIL) 20 MG tablet   Oral   Take 20 mg by mouth daily.         . metolazone (ZAROXOLYN) 5 MG tablet   Oral   Take 1 tablet (5 mg total) by mouth daily.   30 tablet   0   . omeprazole (PRILOSEC) 40 MG capsule   Oral   Take 40 mg by mouth 2 (two) times daily.         . potassium  chloride (K-DUR) 10 MEQ tablet   Oral   Take 10 mEq by mouth 2 (two) times daily. Take 1 tablet (10 mEq total) by mouth 2 (two) times daily.         . pravastatin (PRAVACHOL) 40 MG tablet   Oral   Take 40 mg by mouth daily.         . Rivaroxaban (XARELTO) 20 MG TABS   Oral   Take 1 tablet (20 mg total) by mouth daily with supper.   30 tablet   1   . tiZANidine (ZANAFLEX) 4 MG tablet   Oral   Take 4 mg by mouth every 6 (six) hours as needed for muscle spasms.         . traZODone (DESYREL) 100 MG tablet   Oral   Take 100 mg by mouth at bedtime.         [name] Kitchen ipratropium-albuterol (DUONEB) 0.5-2.5 (3) MG/3ML SOLN   Nebulization   Take 3 mLs by nebulization every 6 (six) hours as needed (wheezing).         . nitroGLYCERIN (NITROSTAT) 0.4 MG SL tablet   Sublingual   Place 1 tablet (0.4 mg total) under the tongue every 5 (five) minutes as needed for chest pain.   100 tablet   3    BP 100/60  Pulse 67  Temp(Src) 97.8 F (36.6 C) (Oral)  Resp 17  SpO2 93% Physical Exam  Nursing note and vitals reviewed. Constitutional: She is oriented to person, place, and time. She appears well-developed and well-nourished.  Morbid obesity  HENT:  Head: Normocephalic and atraumatic.  Eyes: Pupils are equal, round, and reactive to light.  Neck: Normal range of motion.  Cardiovascular: Normal rate.   Pulmonary/Chest: Effort normal and breath sounds normal. She exhibits no tenderness.  Abdominal: There is tenderness.  Mild diffuse abdominal tenderness  Musculoskeletal: Normal range of motion. She exhibits edema and tenderness.  Neurological: She is alert and oriented to person, place, and time.  Skin: Skin is warm. No rash noted. No erythema.    ED Course  Procedures (including critical care time) Labs Review Labs Reviewed  CBC WITH DIFFERENTIAL - Abnormal; Notable for the following:    MCH 25.6 (*)    RDW 16.6 (*)  All other components within normal limits  BLOOD GAS,  VENOUS - Abnormal; Notable for the following:    pH, Ven 7.392 (*)    pCO2, Ven 61.1 (*)    Bicarbonate 36.4 (*)    Acid-Base Excess 9.7 (*)    All other components within normal limits  BASIC METABOLIC PANEL - Abnormal; Notable for the following:    Sodium 131 (*)    Chloride 85 (*)    CO2 35 (*)    Glucose, Bld 192 (*)    BUN 63 (*)    Creatinine, Ser 2.04 (*)    GFR calc non Af Amer 27 (*)    GFR calc Af Amer 31 (*)    All other components within normal limits   Imaging Review No results found.  EKG Interpretation   None       MDM   Final diagnoses:  Acute renal failure  Dehydration  Medication reaction   The patient does have acute renal failure likely secondary to the use of a new diuretics as well as dehydration from persistent nausea and vomiting. She will need admission to the hospital for ongoing hydration, evaluation of kidney function.  No leukocytosis or anemia.     Garald Balding, NP 07/10/13 [id number]  Johnna Acosta, MD 07/10/13 [phone]

## 2013-07-10 NOTE — Progress Notes (Addendum)
PROGRESS NOTE    Brittanni Rulison [id number] DOB: 05/14/1961 DOA: 07/10/2013 PCP: Philis Fendt, MD  HPI/Brief narrative 53 year old female with history of chronic combined CHF, PE, DM 2, HTN, COPD, CAD, morbid obesity and OSA, recently discharged from the hospital after an episode of acute on chronic combined CHF, returned with complaints of several episodes of nausea and vomiting and poor appetite. Denies sickly contacts with similar symptoms or eating anything unusual. She was found to be in acute renal failure.   Assessment/Plan:  Nausea and vomiting/? Acute GE - Patient was admitted to the hospital and treated supportively with IV fluids, antiemetics and PPIs. Clinically improved. Advance diet as tolerated and monitor closely.  Acute renal failure - Creatinine went up from 1.04 on 2/5 to 2.04 on 2/10. Likely secondary to dehydration/prerenal in the context of acute GE, diuretics and ACE inhibitors. These medications have been temporarily held. Briefly hydrate with IV fluids and encourage oral intake. Creatinine improving. Follow BMP  Chronic combined CHF - Compensated. Fluid management as above. Monitor closely  History of pulmonary embolism - Continue Xarelto per pharmacy.  Hypertension - Soft blood pressures. Hold antihypertensives.  History of CAD - Asymptomatic. Continue Plavix.  Type II DM, uncontrolled - Advanced to diabetic diet as tolerated. Continue Lantus and SSI   Code Status: Full Family Communication: Discussed with significant other/roommate at bedside. Disposition Plan: Home when medically stable, possibly 2/11   Consultants:  None  Procedures:  None  Antibiotics:  None   Subjective: Feels much better. Intermittent mild nausea but no vomiting. No chest pain or dyspnea.  Objective: Filed Vitals:   07/10/13 0635 07/10/13 0900 07/10/13 1045 07/10/13 1341  BP: 109/60  104/65 100/57  Pulse: 74  73 65  Temp: 97.9 F (36.6 C) 97 F (36.1  C) 97.4 F (36.3 C) 97.8 F (36.6 C)  TempSrc: Oral  Oral Oral  Resp: 18  20 20   Height: 5' 7.5" (1.715 m)     Weight: 137.44 kg (303 lb)     SpO2: 97%  100% 100%    Intake/Output Summary (Last 24 hours) at 07/10/13 1716 Last data filed at 07/10/13 1500  Gross per 24 hour  Intake    445 ml  Output    350 ml  Net     95 ml   Filed Weights   07/10/13 0635  Weight: 137.44 kg (303 lb)     Exam:  General exam: Morbidly obese female lying comfortably in bed. Respiratory system: Clear. No increased work of breathing. Cardiovascular system: S1 & S2 heard, RRR. No JVD, murmurs, gallops, clicks. Trace bilateral ankle edema. Telemetry: Sinus rhythm Gastrointestinal system: Abdomen is nondistended, soft and nontender. Normal bowel sounds heard. Central nervous system: Alert and oriented. No focal neurological deficits. Extremities: Symmetric 5 x 5 power.   Data Reviewed: Basic Metabolic Panel:  Recent Labs Lab 07/04/13 0349 07/05/13 0810 07/10/13 0310 07/10/13 0810  NA 138 139 131* 132*  K 4.4 3.7 4.3 4.5  CL 98 94* 85* 88*  CO2 33* 33* 35* 31  GLUCOSE 276* 227* 192* 246*  BUN 31* 31* 63* 58*  CREATININE 1.08 1.04 2.04* 1.59*  CALCIUM 8.7 8.7 8.6 8.5   Liver Function Tests:  Recent Labs Lab 07/05/13 0810 07/10/13 0810  AST 11 22  ALT 13 20  ALKPHOS 118* 86  BILITOT <0.2* 0.2*  PROT 7.2 7.6  ALBUMIN 3.0* 3.3*    Recent Labs Lab 07/10/13 0810  LIPASE 25  AMYLASE 19  No results found for this basename: AMMONIA,  in the last 168 hours CBC:  Recent Labs Lab 07/10/13 0310  WBC 10.2  NEUTROABS 6.2  HGB 12.3  HCT 38.0  MCV 79.2  PLT 334   Cardiac Enzymes: No results found for this basename: CKTOTAL, CKMB, CKMBINDEX, TROPONINI,  in the last 168 hours BNP (last 3 results)  Recent Labs  06/24/13 1838 06/29/13 1430 07/01/13 0235  PROBNP 256.4* 770.4* 1194.0*   CBG:  Recent Labs Lab 07/04/13 1713 07/04/13 2222 07/05/13 0722 07/05/13 1144  07/10/13 0726  GLUCAP 240* 323* 183* 216* 215*    No results found for this or any previous visit (from the past 240 hour(s)).     Studies: Dg Abd 2 Views  07/10/2013   CLINICAL DATA:  Abdominal pain, nausea, vomiting  EXAM: ABDOMEN - 2 VIEW  COMPARISON:  Chest radiograph 06/29/2013 CT chest 01/23/2013  FINDINGS: Chronic interstitial prominence/scarring in the left mid lung is unchanged. No free intraperitoneal air is seen on the upright view. The bowel gas pattern is nonobstructive. No significant stool burden noted. Several pelvic phleboliths are seen. No definite urinary tract stones. No acute osseous abnormality.  IMPRESSION: Nonobstructive bowel gas pattern.   Electronically Signed   By: Curlene Dolphin M.D.   On: 07/10/2013 10:53        Scheduled Meds: . carvedilol  25 mg Oral BID WC  . clopidogrel  75 mg Oral Daily  . fluticasone  2 spray Each Nare Daily  . insulin aspart  0-15 Units Subcutaneous TID WC  . insulin aspart  0-5 Units Subcutaneous QHS  . insulin glargine  55 Units Subcutaneous QHS  . pantoprazole (PROTONIX) IV  40 mg Intravenous Q12H  . rivaroxaban  20 mg Oral Q supper  . simvastatin  40 mg Oral q1800  . sodium chloride  3 mL Intravenous Q12H  . traZODone  100 mg Oral QHS   Continuous Infusions: . sodium chloride 60 mL/hr at 07/10/13 1225    Principal Problem:   Intractable nausea and vomiting Active Problems:   Chronic anticoagulation   Coronary artery disease   Chronic pulmonary embolism   Essential hypertension   Acute renal failure   Chronic diastolic congestive heart failure    Time spent: 30 minutes.    Vernell Leep, MD, FACP, FHM. [facility] Pager [phone]  If 7PM-7AM, please contact night-coverage [email or web address] Password [email or web address] 07/10/2013, 5:16 PM    LOS: 0 days

## 2013-07-11 DIAGNOSIS — E785 Hyperlipidemia, unspecified: Secondary | ICD-10-CM

## 2013-07-11 DIAGNOSIS — J449 Chronic obstructive pulmonary disease, unspecified: Secondary | ICD-10-CM

## 2013-07-11 DIAGNOSIS — I5032 Chronic diastolic (congestive) heart failure: Secondary | ICD-10-CM

## 2013-07-11 DIAGNOSIS — I509 Heart failure, unspecified: Secondary | ICD-10-CM

## 2013-07-11 DIAGNOSIS — I2589 Other forms of chronic ischemic heart disease: Secondary | ICD-10-CM

## 2013-07-11 DIAGNOSIS — R1115 Cyclical vomiting syndrome unrelated to migraine: Secondary | ICD-10-CM

## 2013-07-11 DIAGNOSIS — I5033 Acute on chronic diastolic (congestive) heart failure: Secondary | ICD-10-CM

## 2013-07-11 LAB — GLUCOSE, CAPILLARY
GLUCOSE-CAPILLARY: 208 mg/dL — AB (ref 70–99)
GLUCOSE-CAPILLARY: 160 mg/dL — AB (ref 70–99)
Glucose-Capillary: 210 mg/dL — ABNORMAL HIGH (ref 70–99)
Glucose-Capillary: 220 mg/dL — ABNORMAL HIGH (ref 70–99)

## 2013-07-11 LAB — BASIC METABOLIC PANEL
BUN: 37 mg/dL — AB (ref 6–23)
CO2: 33 meq/L — AB (ref 19–32)
Calcium: 8.6 mg/dL (ref 8.4–10.5)
Chloride: 91 mEq/L — ABNORMAL LOW (ref 96–112)
Creatinine, Ser: 1.23 mg/dL — ABNORMAL HIGH (ref 0.50–1.10)
GFR calc Af Amer: 57 mL/min — ABNORMAL LOW (ref >90)
GFR calc non Af Amer: 49 mL/min — ABNORMAL LOW (ref >90)
Glucose, Bld: 242 mg/dL — ABNORMAL HIGH (ref 70–99)
Potassium: 4.3 mEq/L (ref 3.7–5.3)
SODIUM: 133 meq/L — AB (ref 137–147)

## 2013-07-11 MED ORDER — INSULIN ASPART 100 UNIT/ML ~~LOC~~ SOLN
0.0000 [IU] | SUBCUTANEOUS | Status: DC
  Administered 2013-07-11 (×2): 4 [IU] via SUBCUTANEOUS
  Administered 2013-07-12 (×4): 3 [IU] via SUBCUTANEOUS
  Administered 2013-07-12: 4 [IU] via SUBCUTANEOUS
  Administered 2013-07-13: 3 [IU] via SUBCUTANEOUS
  Administered 2013-07-14 (×2): 7 [IU] via SUBCUTANEOUS
  Administered 2013-07-15: 3 [IU] via SUBCUTANEOUS
  Administered 2013-07-15: 4 [IU] via SUBCUTANEOUS
  Administered 2013-07-15 – 2013-07-16 (×3): 3 [IU] via SUBCUTANEOUS
  Administered 2013-07-16: 4 [IU] via SUBCUTANEOUS

## 2013-07-11 MED ORDER — METOCLOPRAMIDE HCL 5 MG/ML IJ SOLN: 10.0000 mg | Freq: Three times a day (TID) | INTRAMUSCULAR | Status: DC

## 2013-07-11 MED ORDER — METOCLOPRAMIDE HCL 5 MG/ML IJ SOLN
10.0000 mg | Freq: Three times a day (TID) | INTRAMUSCULAR | Status: DC
  Administered 2013-07-11: 10 mg via INTRAVENOUS
  Filled 2013-07-11 (×5): qty 2

## 2013-07-11 NOTE — Progress Notes (Signed)
Patient changed to inpatient- requiring frequent IV antiemetics.

## 2013-07-11 NOTE — Progress Notes (Addendum)
[facility] PROGRESS NOTE  Tamara Silva [id number] DOB: [birth year] DOA: 07/10/2013 PCP: Philis Fendt, MD  Assessment/Plan:   Nausea and vomiting/? Acute GE  - Patient was admitted to the hospital and treated supportively with IV fluids, antiemetics and PPIs.   -Continue clear liquid diet    Acute renal failure  - Creatinine went up from 1.04 on 2/5 to 2.04 on 2/10. Likely secondary to dehydration/prerenal in the context of acute GE, diuretics and ACE inhibitors.  -2/10 creatinine=1.23  Chronic combined CHF  - Compensated. Fluid management as above. Monitor closely -ProBNP 07/01/2013= 1194 -  History of pulmonary embolism  - Continue Xarelto per pharmacy.  -DC Plavix secondary to increased risk of bleeding when combined with Xarelto  Hypertension  - Soft blood pressures. Hold antihypertensives.   History of CAD  - Asymptomatic. Continue Plavix.   Type II DM, uncontrolled  - Advanced to diabetic diet as tolerated. Continue Lantus and SSI -Hemoglobin A1c 07/01/2013= 8.9  Bipolar disease -Not on medication  OSA -CPAP per respiratory   Code Status: Full Family Communication: Daughter present for discussion of plan at Disposition Plan: Resolution N./V., better control   Consultants:    Procedures:    Antibiotics:    HPI/Subjective: Tamara Silva is a 53 y.o.BF PMHx  chronic diastolic dysfunction, pulmonary embolism, diabetes mellitus, hypertension, COPD, coronary artery disease, morbid obesity, obstructive sleep apnea.  The patient is coming from home.  The patient presented with complaints of nausea and vomiting .she mentions that she was given one dose of metolazone in the hospital and then discharged on the same home.after taking the medication at home she started having episodes of nausea and vomiting.she had nonbloody with this vomiting x5 last 24 hours associated with acid reflux.she denies any fever, chills, headache, cough, chest pain,  palpitation, shortness of breath, orthopnea, PND, diarrhea, constipation, active bleeding, burning urination, dizziness, pedal edema, focal neurological deficit.  She continues to take the medication despite her nausea.she has poor appetite. She has lost 10 pounds since discharge 2/11 continues to complain of N./V. with clear fluids, does not know dry weight. States understands that she has poor control all diabetes   Objective: Filed Vitals:   07/11/13 0204 07/11/13 0620 07/11/13 1032 07/11/13 1340  BP: 130/67 146/83  112/49  Pulse: 72 69  66  Temp: 98.7 F (37.1 C) 97.6 F (36.4 C)  98.2 F (36.8 C)  TempSrc: Oral Oral  Oral  Resp: 20 20  20   Height:      Weight:      SpO2: 95% 99% 96% 100%    Intake/Output Summary (Last 24 hours) at 07/11/13 1409 Last data filed at 07/11/13 1300  Gross per 24 hour  Intake    375 ml  Output   2500 ml  Net  -2125 ml   Filed Weights   07/10/13 0635  Weight: 137.44 kg (303 lb)    Exam:   General:  A./O. x4, NAD  Cardiovascular: Regular rhythm and rate, negative murmurs rubs, gallops  Respiratory: Diffuse wheezing  Abdomen: Soft nontender nondistended plus bowel sound  Musculoskeletal: Negative pedal edema   Data Reviewed: Basic Metabolic Panel:  Recent Labs Lab 07/05/13 0810 07/10/13 0310 07/10/13 0810 07/11/13 0535  NA 139 131* 132* 133*  K 3.7 4.3 4.5 4.3  CL 94* 85* 88* 91*  CO2 33* 35* 31 33*  GLUCOSE 227* 192* 246* 242*  BUN 31* 63* 58* 37*  CREATININE 1.04 2.04* 1.59* 1.23*  CALCIUM 8.7 8.6 8.5  8.6   Liver Function Tests:  Recent Labs Lab 07/05/13 0810 07/10/13 0810  AST 11 22  ALT 13 20  ALKPHOS 118* 86  BILITOT <0.2* 0.2*  PROT 7.2 7.6  ALBUMIN 3.0* 3.3*    Recent Labs Lab 07/10/13 0810  LIPASE 25  AMYLASE 19   No results found for this basename: AMMONIA,  in the last 168 hours CBC:  Recent Labs Lab 07/10/13 0310  WBC 10.2  NEUTROABS 6.2  HGB 12.3  HCT 38.0  MCV 79.2  PLT 334    Cardiac Enzymes: No results found for this basename: CKTOTAL, CKMB, CKMBINDEX, TROPONINI,  in the last 168 hours BNP (last 3 results)  Recent Labs  06/24/13 1838 06/29/13 1430 07/01/13 0235  PROBNP 256.4* 770.4* 1194.0*   CBG:  Recent Labs Lab 07/10/13 1205 07/10/13 1600 07/10/13 2001 07/10/13 2358 07/11/13 0614  GLUCAP 281* 230* 204* 190* 208*    No results found for this or any previous visit (from the past 240 hour(s)).   Studies: Dg Abd 2 Views  07/10/2013   CLINICAL DATA:  Abdominal pain, nausea, vomiting  EXAM: ABDOMEN - 2 VIEW  COMPARISON:  Chest radiograph 06/29/2013 CT chest 01/23/2013  FINDINGS: Chronic interstitial prominence/scarring in the left mid lung is unchanged. No free intraperitoneal air is seen on the upright view. The bowel gas pattern is nonobstructive. No significant stool burden noted. Several pelvic phleboliths are seen. No definite urinary tract stones. No acute osseous abnormality.  IMPRESSION: Nonobstructive bowel gas pattern.   Electronically Signed   By: Curlene Dolphin M.D.   On: 07/10/2013 10:53    Scheduled Meds: . carvedilol  25 mg Oral BID WC  . clopidogrel  75 mg Oral Daily  . fluticasone  2 spray Each Nare Daily  . insulin aspart  0-15 Units Subcutaneous TID WC  . insulin aspart  0-5 Units Subcutaneous QHS  . insulin glargine  55 Units Subcutaneous QHS  . pantoprazole (PROTONIX) IV  40 mg Intravenous Q12H  . rivaroxaban  20 mg Oral Q supper  . simvastatin  40 mg Oral q1800  . sodium chloride  3 mL Intravenous Q12H  . traZODone  100 mg Oral QHS   Continuous Infusions:   Principal Problem:   Intractable nausea and vomiting Active Problems:   Chronic anticoagulation   Coronary artery disease   Chronic pulmonary embolism   Essential hypertension   Insulin dependent diabetes mellitus   Acute renal failure   Chronic diastolic congestive heart failure    Time spent: 45 minutes    Tamara Silva, J  [facility] Pager [phone]. If 7PM-7AM, please contact night-coverage at [email or web address], password [facility] 07/11/2013, 2:09 PM  LOS: 1 day

## 2013-07-11 NOTE — Progress Notes (Addendum)
Inpatient Diabetes Program Recommendations  AACE/ADA: New Consensus Statement on Inpatient Glycemic Control (2013)  Target Ranges:  Prepandial:   less than 140 mg/dL      Peak postprandial:   less than 180 mg/dL (1-2 hours)      Critically ill patients:  140 - 180 mg/dL   Hyperglycemia: Pt documentation of now eating 100%  Diabetes history: type 2 Outpatient Diabetes medications: Lantus 55 units and 4-10 units novolog tidwc Please add some meal coverage of 6 units tidwc OP referral requested on last visit a week ago. Doubtful pt would go if ordered, but we could try! Thank you, Rosita Kea, RN, CNS, Diabetes Coordinator [phone])

## 2013-07-12 DIAGNOSIS — I2782 Chronic pulmonary embolism: Secondary | ICD-10-CM

## 2013-07-12 LAB — CBC WITH DIFFERENTIAL/PLATELET
BASOS ABS: 0 10*3/uL (ref 0.0–0.1)
BASOS PCT: 0 % (ref 0–1)
EOS PCT: 1 % (ref 0–5)
Eosinophils Absolute: 0.1 10*3/uL (ref 0.0–0.7)
HCT: 38.3 % (ref 36.0–46.0)
Hemoglobin: 12.2 g/dL (ref 12.0–15.0)
LYMPHS PCT: 33 % (ref 12–46)
Lymphs Abs: 2.9 10*3/uL (ref 0.7–4.0)
MCH: 25.8 pg — ABNORMAL LOW (ref 26.0–34.0)
MCHC: 31.9 g/dL (ref 30.0–36.0)
MCV: 81.1 fL (ref 78.0–100.0)
MONO ABS: 1.2 10*3/uL — AB (ref 0.1–1.0)
Monocytes Relative: 14 % — ABNORMAL HIGH (ref 3–12)
Neutro Abs: 4.5 10*3/uL (ref 1.7–7.7)
Neutrophils Relative %: 52 % (ref 43–77)
Platelets: 262 10*3/uL (ref 150–400)
RBC: 4.72 MIL/uL (ref 3.87–5.11)
RDW: 16.7 % — AB (ref 11.5–15.5)
WBC: 8.7 10*3/uL (ref 4.0–10.5)

## 2013-07-12 LAB — COMPREHENSIVE METABOLIC PANEL
ALBUMIN: 3.2 g/dL — AB (ref 3.5–5.2)
ALT: 19 U/L (ref 0–35)
AST: 18 U/L (ref 0–37)
Alkaline Phosphatase: 62 U/L (ref 39–117)
BUN: 21 mg/dL (ref 6–23)
CALCIUM: 8.6 mg/dL (ref 8.4–10.5)
CO2: 29 mEq/L (ref 19–32)
CREATININE: 1.08 mg/dL (ref 0.50–1.10)
Chloride: 92 mEq/L — ABNORMAL LOW (ref 96–112)
GFR calc Af Amer: 67 mL/min — ABNORMAL LOW (ref >90)
GFR calc non Af Amer: 58 mL/min — ABNORMAL LOW (ref >90)
Glucose, Bld: 142 mg/dL — ABNORMAL HIGH (ref 70–99)
Potassium: 3.7 mEq/L (ref 3.7–5.3)
Sodium: 134 mEq/L — ABNORMAL LOW (ref 137–147)
TOTAL PROTEIN: 7.5 g/dL (ref 6.0–8.3)
Total Bilirubin: 0.3 mg/dL (ref 0.3–1.2)

## 2013-07-12 LAB — GLUCOSE, CAPILLARY
GLUCOSE-CAPILLARY: 122 mg/dL — AB (ref 70–99)
GLUCOSE-CAPILLARY: 132 mg/dL — AB (ref 70–99)
GLUCOSE-CAPILLARY: 135 mg/dL — AB (ref 70–99)
GLUCOSE-CAPILLARY: 166 mg/dL — AB (ref 70–99)
Glucose-Capillary: 153 mg/dL — ABNORMAL HIGH (ref 70–99)
Glucose-Capillary: 145 mg/dL — ABNORMAL HIGH (ref 70–99)

## 2013-07-12 LAB — MAGNESIUM: Magnesium: 2.5 mg/dL (ref 1.5–2.5)

## 2013-07-12 MED ORDER — CHLORPROMAZINE HCL 25 MG/ML IJ SOLN
50.0000 mg | Freq: Once | INTRAMUSCULAR | Status: AC
  Administered 2013-07-12: 50 mg via INTRAMUSCULAR
  Filled 2013-07-12 (×2): qty 2

## 2013-07-12 MED ORDER — ONDANSETRON HCL 4 MG PO TABS
4.0000 mg | ORAL_TABLET | Freq: Four times a day (QID) | ORAL | Status: DC | PRN
  Administered 2013-07-13: 4 mg via ORAL
  Filled 2013-07-12: qty 1

## 2013-07-12 MED ORDER — ONDANSETRON HCL 4 MG PO TABS
4.0000 mg | ORAL_TABLET | Freq: Once | ORAL | Status: AC
  Administered 2013-07-12: 4 mg via ORAL
  Filled 2013-07-12: qty 1

## 2013-07-12 MED ORDER — INSULIN ASPART 100 UNIT/ML ~~LOC~~ SOLN
8.0000 [IU] | Freq: Three times a day (TID) | SUBCUTANEOUS | Status: DC
  Administered 2013-07-14 – 2013-07-16 (×3): 8 [IU] via SUBCUTANEOUS

## 2013-07-12 MED ORDER — METOCLOPRAMIDE HCL 10 MG PO TABS
10.0000 mg | ORAL_TABLET | Freq: Three times a day (TID) | ORAL | Status: DC
  Administered 2013-07-12 – 2013-07-13 (×3): 10 mg via ORAL
  Filled 2013-07-12 (×6): qty 1

## 2013-07-12 NOTE — Progress Notes (Signed)
Pt c/o nausea and states she was vomiting, but only sputum in container. Clear and thin. Requesting ginger ale and jello. Was able to consume small amount of each.   Requesting meds for nausea and pain.  MD please assess for possible PICC??

## 2013-07-12 NOTE — Progress Notes (Signed)
Per IV team, they can not guarantee that they will be able to see her for her PICC by 8 am. They said that they will be here after reviewing there list in the AM. They will call the day RN in the morning and let them know what time they will be able to place a PICC for this patient. Thanks.

## 2013-07-12 NOTE — Progress Notes (Signed)
Patient still c/o nausea. Pt has had no vomiting. Pt sometimes spits into a cup but no observable vomit.   PO Zofran ordered and given.   IV team was also unsuccessful with starting a new IV after IV taken out last night.   Dr. Sherral Hammers notified of all concerns above. Will continue to monitor.

## 2013-07-12 NOTE — Progress Notes (Signed)
Writer attempted new IV site, as current site occluded.  IV team attempted X 2 and used Ultrasound without success. Pt currently without IV access.  Will have MD assess for possible PICC placement.  Pt resting comfortably at this time; CPap on.

## 2013-07-12 NOTE — Progress Notes (Signed)
[facility] PROGRESS NOTE  Tamara Silva [id number] DOB: [birth year] DOA: 07/10/2013 PCP: Philis Fendt, MD  Assessment/Plan:   Nausea and vomiting/? Acute GE  - Continued Refractory N/V with Max antiemetics and PPIs. -Unable to tolerate any diet. Counseled most likely secondary to diabetic gastroparesis however will consult GI in the a.m. and request an EGD   -Chlorpromazine 50 mg IM x1 -Change Zofran to PO/PR   -Change to Reglan to by mouth patient may tolerate after having chlorpromazine dose   Acute renal failure  - Creatinine went up from 1.04 on 2/5 to 2.04 on 2/10. Likely secondary to dehydration/prerenal in the context of acute GE, diuretics and ACE inhibitors.  - 2/12 creatinine=1.08  Chronic combined CHF  - Compensated. Fluid management as above. Monitor closely -ProBNP 07/01/2013= 1194 -2/10 weight= 137.4 kg and, Current weight= 134.1 kg standing  History of pulmonary embolism  - Continue Xarelto per pharmacy.  -DC Plavix secondary to increased risk of bleeding when combined with Xarelto  Hypertension  - Within ADA guidelines  -` Continue Coreg 25 mg BID   History of CAD  - Asymptomatic. Continue Plavix.   Type II DM, uncontrolled  - Advanced to diabetic diet as tolerated. Continue Lantus and SSI -Hemoglobin A1c 07/01/2013= 8.9  Bipolar disease -Not on medication; await psychiatry recommendation  OSA -CPAP per respiratory   Code Status: Full Family Communication: Daughter present for discussion of plan at Disposition Plan: Resolution N./V., better control   Consultants: Dr Berniece Andreas (psychiatry)    Procedures: CXR 07/10/2013 Chronic interstitial prominence/scarring in the left mid lung is  unchanged. No free intraperitoneal air is seen on the upright view.  The bowel gas pattern is nonobstructive. No significant stool burden  noted. Several pelvic phleboliths are seen. No definite urinary  tract stones. No acute osseous  abnormality.   Antibiotics:    HPI/Subjective: Tamara Silva is a 53 y.o.BF PMHx  chronic diastolic dysfunction, pulmonary embolism, diabetes mellitus, hypertension, COPD, coronary artery disease, morbid obesity, obstructive sleep apnea.  The patient is coming from home.  The patient presented with complaints of nausea and vomiting .she mentions that she was given one dose of metolazone in the hospital and then discharged on the same home.after taking the medication at home she started having episodes of nausea and vomiting.she had nonbloody with this vomiting x5 last 24 hours associated with acid reflux.she denies any fever, chills, headache, cough, chest pain, palpitation, shortness of breath, orthopnea, PND, diarrhea, constipation, active bleeding, burning urination, dizziness, pedal edema, focal neurological deficit.  She continues to take the medication despite her nausea.she has poor appetite. She has lost 10 pounds since discharge 2/11 continues to complain of N./V. with clear fluids, does not know dry weight. States understands that she has poor control all diabetes. 2/12 although medical staff has observed patient throughout day and patient has not had N/V. except on 1- 2 occasions patient states has been positive N./V. all day. Continues to dry heave. Positive flatus, negative BM,   Objective: Filed Vitals:   07/11/13 2000 07/11/13 2135 07/12/13 0443 07/12/13 1438  BP: 134/77  129/78 135/60  Pulse: 65 73 70 66  Temp: 98 F (36.7 C)  97.8 F (36.6 C) 97.8 F (36.6 C)  TempSrc: Oral  Oral Oral  Resp: 20 18 20 18   Height:      Weight:   134.1 kg (295 lb 10.2 oz)   SpO2: 98% 99% 97% 98%    Intake/Output Summary (Last 24 hours) at 07/12/13  1915 Last data filed at 07/12/13 1700  Gross per 24 hour  Intake    240 ml  Output   1400 ml  Net  -1160 ml   Filed Weights   07/10/13 0635 07/12/13 0443  Weight: 137.44 kg (303 lb) 134.1 kg (295 lb 10.2 oz)    Exam:   General:   A./O. x4, NAD  Cardiovascular: Regular rhythm and rate, negative murmurs rubs, gallops  Respiratory: Diffuse wheezing  Abdomen: Soft nontender nondistended plus hypoactive bowel sound  Musculoskeletal: Negative pedal edema   Data Reviewed: Basic Metabolic Panel:  Recent Labs Lab 07/10/13 0310 07/10/13 0810 07/11/13 0535 07/12/13 0535  NA 131* 132* 133* 134*  K 4.3 4.5 4.3 3.7  CL 85* 88* 91* 92*  CO2 35* 31 33* 29  GLUCOSE 192* 246* 242* 142*  BUN 63* 58* 37* 21  CREATININE 2.04* 1.59* 1.23* 1.08  CALCIUM 8.6 8.5 8.6 8.6  MG  --   --   --  2.5   Liver Function Tests:  Recent Labs Lab 07/10/13 0810 07/12/13 0535  AST 22 18  ALT 20 19  ALKPHOS 86 62  BILITOT 0.2* 0.3  PROT 7.6 7.5  ALBUMIN 3.3* 3.2*    Recent Labs Lab 07/10/13 0810  LIPASE 25  AMYLASE 19   No results found for this basename: AMMONIA,  in the last 168 hours CBC:  Recent Labs Lab 07/10/13 0310 07/12/13 0535  WBC 10.2 8.7  NEUTROABS 6.2 4.5  HGB 12.3 12.2  HCT 38.0 38.3  MCV 79.2 81.1  PLT 334 262   Cardiac Enzymes: No results found for this basename: CKTOTAL, CKMB, CKMBINDEX, TROPONINI,  in the last 168 hours BNP (last 3 results)  Recent Labs  06/24/13 1838 06/29/13 1430 07/01/13 0235  PROBNP 256.4* 770.4* 1194.0*   CBG:  Recent Labs Lab 07/12/13 0022 07/12/13 0434 07/12/13 0749 07/12/13 1123 07/12/13 1654  GLUCAP 166* 145* 132* 122* 135*    No results found for this or any previous visit (from the past 240 hour(s)).   Studies: No results found.  Scheduled Meds: . carvedilol  25 mg Oral BID WC  . chlorproMAZINE  50 mg Intramuscular Once  . fluticasone  2 spray Each Nare Daily  . insulin aspart  0-20 Units Subcutaneous 6 times per day  . insulin aspart  8 Units Subcutaneous TID WC  . insulin glargine  55 Units Subcutaneous QHS  . metoCLOPramide (REGLAN) injection  10 mg Intravenous 3 times per day  . pantoprazole (PROTONIX) IV  40 mg Intravenous Q12H  .  rivaroxaban  20 mg Oral Q supper  . simvastatin  40 mg Oral q1800  . sodium chloride  3 mL Intravenous Q12H  . traZODone  100 mg Oral QHS   Continuous Infusions:   Principal Problem:   Intractable nausea and vomiting Active Problems:   COPD exacerbation   Chronic anticoagulation   Coronary artery disease   Chronic pulmonary embolism   Tobacco abuse   Bipolar disorder   Essential hypertension   Insulin dependent diabetes mellitus   Acute renal failure   Chronic diastolic congestive heart failure    Time spent: 45 minutes    Ringo Sherod, J  [facility] Pager [phone]. If 7PM-7AM, please contact night-coverage at [email or web address], password [facility] 07/12/2013, 7:15 PM  LOS: 2 days

## 2013-07-12 NOTE — Consult Note (Signed)
[name] Face-to-Face Psychiatry Consult   Reason for Consult:  Bipolar Disorder Referring Physician:  Dr Meryl Crutch is an 53 y.o. female. Total Time spent with patient: 30 minutes  Assessment: AXIS I:  Bipolar, Depressed AXIS II:  Deferred AXIS III:   Past Medical History  Diagnosis Date  . Diabetes mellitus without complication   . PE (pulmonary embolism)   . Hypercholesterolemia   . Hypertension   . COPD (chronic obstructive pulmonary disease)   . Asthma   . Coronary artery disease   . CHF (congestive heart failure)   . Mental disorder     BIPOLAR  . Shortness of breath   . GERD (gastroesophageal reflux disease)   . Neuromuscular disorder     DIABETIC NEUROPATHY  . Ischemic cardiomyopathy   . Bipolar disorder   . Post-traumatic stress syndrome    AXIS IV:  economic problems, other psychosocial or environmental problems and problems related to social environment AXIS V:  61-70 mild symptoms  Plan:  No evidence of imminent risk to self or others at present.   Patient does not meet criteria for psychiatric inpatient admission. Supportive therapy provided about ongoing stressors. Discussed crisis plan, support from social network, calling 911, coming to the Emergency Department, and calling Suicide Hotline.  Subjective:   Tamara Silva is a 53 y.o. female patient admitted with Nausea and vomiting.  HPI:  Patient seen chart reviewed.  Patient is 53 year old African American unemployed female who was admitted on the medical floor because of nausea vomiting and generalized weakness.  Patient has history of bipolar disorder.  Psychiatry consult was called for medication management.  The patient told that she has not taken her medication for her bipolar disorder and more than 1-1/2 years.  She used to get medication from a local county mental [facility] in [place].  She does not remember the details very well but admitted she used to take trazodone.  She also had tried  Abilify, Seroquel, Haldol and Vistaril.  Patient remember history of severe mood swings, anger, agitation and hallucination but in past few months she's been doing better and there has been no recent episodes of agitation and anger.  She is more involved in church.  Patient moved from [place] in 08-25-2012, after her brother died in [place].  Patient has multiple deaths in the past few years.  She has lost her nephew, brother and 2 other family member in past 3 years.  She admitted some time for sleep, irritability, anger and crying spells but she has been more involved in church and religion.  Pressure-like telephone which is helping her sleep.  She denies any suicidal thoughts or homicidal thoughts.  She denies any hallucination or any paranoia.  She appears very tired but she believes because of physical reason.  Patient told that if her physical symptoms did improve and her depression also get better.  Currently patient living with her friend and she has some support from her daughter.  She is on disability.  She has at least 2 psychiatric hospitalization in the past which was due to anger and poor impulse control.  Patient denies any history of suicidal attempt in the past. She had history of using drugs and alcohol but denies using in recent months. HPI Elements:   Location:  Nausea and vomiting, poor sleep, crying spells and feeling tired. Quality:  Fair. Severity:  Mild.  Past Psychiatric History: Past Medical History  Diagnosis Date  . Diabetes mellitus without complication   .  PE (pulmonary embolism)   . Hypercholesterolemia   . Hypertension   . COPD (chronic obstructive pulmonary disease)   . Asthma   . Coronary artery disease   . CHF (congestive heart failure)   . Mental disorder     BIPOLAR  . Shortness of breath   . GERD (gastroesophageal reflux disease)   . Neuromuscular disorder     DIABETIC NEUROPATHY  . Ischemic cardiomyopathy   . Bipolar disorder   .  Post-traumatic stress syndrome     reports that she quit smoking about 7 months ago. Her smoking use included Cigarettes. She smoked 0.00 packs per day. She has never used smokeless tobacco. She reports that she does not drink alcohol or use illicit drugs. Family History  Problem Relation Age of Onset  . Stroke Mother   . Cancer Father      Living Arrangements: Spouse/significant other   Abuse/Neglect [facility]) Physical Abuse: Denies Verbal Abuse: Denies Sexual Abuse: Denies Allergies:   Allergies  Allergen Reactions  . Nsaids Other (See Comments)    "I do not take NSAIDs d/t interfering with other meds"  . Metolazone Nausea And Vomiting    Side effect.    ACT Assessment Complete:  No:   Past Psychiatric History: Diagnosis:  Bipolar  Hospitalizations:  yes   Outpatient Care:  In [place]   Substance Abuse Care:  History of using drugs and alcohol   Self-Mutilation:  Denies   Suicidal Attempts:  Denies   Homicidal Behaviors:  History of anger and poor impulse control    Violent Behaviors:  History of anger and poor impulse control    Place of Residence:  Living with her friend Marital Status:  Single Employed/Unemployed:  Unemployed Education:  Did not finish high school Family Supports:  Limited Objective: Blood pressure 135/60, pulse 66, temperature 97.8 F (36.6 C), temperature source Oral, resp. rate 18, height 5' 7.5" (1.715 m), weight 295 lb 10.2 oz (134.1 kg), SpO2 98.00%.Body mass index is 45.59 kg/(m^2). Results for orders placed during the hospital encounter of 07/10/13 (from the past 72 hour(s))  CBC WITH DIFFERENTIAL     Status: Abnormal   Collection Time    07/10/13  3:10 AM      Result Value Ref Range   WBC 10.2  4.0 - 10.5 K/uL   RBC 4.80  3.87 - 5.11 MIL/uL   Hemoglobin 12.3  12.0 - 15.0 g/dL   HCT 38.0  36.0 - 46.0 %   MCV 79.2  78.0 - 100.0 fL   MCH 25.6 (*) 26.0 - 34.0 pg   MCHC 32.4  30.0 - 36.0 g/dL   RDW 16.6 (*) 11.5 - 15.5 %   Platelets  334  150 - 400 K/uL   Neutrophils Relative % 61  43 - 77 %   Neutro Abs 6.2  1.7 - 7.7 K/uL   Lymphocytes Relative 28  12 - 46 %   Lymphs Abs 2.9  0.7 - 4.0 K/uL   Monocytes Relative 10  3 - 12 %   Monocytes Absolute 1.0  0.1 - 1.0 K/uL   Eosinophils Relative 1  0 - 5 %   Eosinophils Absolute 0.1  0.0 - 0.7 K/uL   Basophils Relative 0  0 - 1 %   Basophils Absolute 0.0  0.0 - 0.1 K/uL  BASIC METABOLIC PANEL     Status: Abnormal   Collection Time    07/10/13  3:10 AM      Result Value Ref  Range   Sodium 131 (*) 137 - 147 mEq/L   Potassium 4.3  3.7 - 5.3 mEq/L   Chloride 85 (*) 96 - 112 mEq/L   CO2 35 (*) 19 - 32 mEq/L   Glucose, Bld 192 (*) 70 - 99 mg/dL   BUN 63 (*) 6 - 23 mg/dL   Creatinine, Ser 2.04 (*) 0.50 - 1.10 mg/dL   Calcium 8.6  8.4 - 10.5 mg/dL   GFR calc non Af Amer 27 (*) >90 mL/min   GFR calc Af Amer 31 (*) >90 mL/min   Comment: (NOTE)     The eGFR has been calculated using the CKD EPI equation.     This calculation has not been validated in all clinical situations.     eGFR's persistently <90 mL/min signify possible Chronic Kidney     Disease.  BLOOD GAS, VENOUS     Status: Abnormal   Collection Time    07/10/13  3:16 AM      Result Value Ref Range   FIO2 0.21     pH, Ven 7.392 (*) 7.250 - 7.300   pCO2, Ven 61.1 (*) 45.0 - 50.0 mmHg   pO2, Ven 0.0 (*) 30.0 - 45.0 mmHg   Comment: BELOW REPORTABLE RANGE   Bicarbonate 36.4 (*) 20.0 - 24.0 mEq/L   TCO2 33.3  0 - 100 mmol/L   Acid-Base Excess 9.7 (*) 0.0 - 2.0 mmol/L   O2 Saturation 34.5     Patient temperature 98.6     Drawn by COLLECTED BY RT     Sample type VENOUS    GLUCOSE, CAPILLARY     Status: Abnormal   Collection Time    07/10/13  7:26 AM      Result Value Ref Range   Glucose-Capillary 215 (*) 70 - 99 mg/dL   Comment 1 Notify RN     Comment 2 Documented in Chart    COMPREHENSIVE METABOLIC PANEL     Status: Abnormal   Collection Time    07/10/13  8:10 AM      Result Value Ref Range   Sodium 132  (*) 137 - 147 mEq/L   Potassium 4.5  3.7 - 5.3 mEq/L   Chloride 88 (*) 96 - 112 mEq/L   CO2 31  19 - 32 mEq/L   Glucose, Bld 246 (*) 70 - 99 mg/dL   BUN 58 (*) 6 - 23 mg/dL   Creatinine, Ser 1.59 (*) 0.50 - 1.10 mg/dL   Calcium 8.5  8.4 - 10.5 mg/dL   Total Protein 7.6  6.0 - 8.3 g/dL   Albumin 3.3 (*) 3.5 - 5.2 g/dL   AST 22  0 - 37 U/L   ALT 20  0 - 35 U/L   Alkaline Phosphatase 86  39 - 117 U/L   Total Bilirubin 0.2 (*) 0.3 - 1.2 mg/dL   GFR calc non Af Amer 36 (*) >90 mL/min   GFR calc Af Amer 42 (*) >90 mL/min   Comment: (NOTE)     The eGFR has been calculated using the CKD EPI equation.     This calculation has not been validated in all clinical situations.     eGFR's persistently <90 mL/min signify possible Chronic Kidney     Disease.  LIPASE, BLOOD     Status: None   Collection Time    07/10/13  8:10 AM      Result Value Ref Range   Lipase 25  11 - 59 U/L  AMYLASE     Status: None   Collection Time    07/10/13  8:10 AM      Result Value Ref Range   Amylase 19  0 - 105 U/L  GLUCOSE, CAPILLARY     Status: Abnormal   Collection Time    07/10/13 12:05 PM      Result Value Ref Range   Glucose-Capillary 281 (*) 70 - 99 mg/dL   Comment 1 Notify RN     Comment 2 Documented in Chart    GLUCOSE, CAPILLARY     Status: Abnormal   Collection Time    07/10/13  4:00 PM      Result Value Ref Range   Glucose-Capillary 230 (*) 70 - 99 mg/dL   Comment 1 Documented in Chart     Comment 2 Notify RN    GLUCOSE, CAPILLARY     Status: Abnormal   Collection Time    07/10/13  8:01 PM      Result Value Ref Range   Glucose-Capillary 204 (*) 70 - 99 mg/dL   Comment 1 Documented in Chart     Comment 2 Notify RN    GLUCOSE, CAPILLARY     Status: Abnormal   Collection Time    07/10/13 11:58 PM      Result Value Ref Range   Glucose-Capillary 190 (*) 70 - 99 mg/dL   Comment 1 Documented in Chart     Comment 2 Notify RN    BASIC METABOLIC PANEL     Status: Abnormal   Collection Time     07/11/13  5:35 AM      Result Value Ref Range   Sodium 133 (*) 137 - 147 mEq/L   Potassium 4.3  3.7 - 5.3 mEq/L   Chloride 91 (*) 96 - 112 mEq/L   CO2 33 (*) 19 - 32 mEq/L   Glucose, Bld 242 (*) 70 - 99 mg/dL   BUN 37 (*) 6 - 23 mg/dL   Comment: DELTA CHECK NOTED     REPEATED TO VERIFY   Creatinine, Ser 1.23 (*) 0.50 - 1.10 mg/dL   Calcium 8.6  8.4 - 10.5 mg/dL   GFR calc non Af Amer 49 (*) >90 mL/min   GFR calc Af Amer 57 (*) >90 mL/min   Comment: (NOTE)     The eGFR has been calculated using the CKD EPI equation.     This calculation has not been validated in all clinical situations.     eGFR's persistently <90 mL/min signify possible Chronic Kidney     Disease.  GLUCOSE, CAPILLARY     Status: Abnormal   Collection Time    07/11/13  6:14 AM      Result Value Ref Range   Glucose-Capillary 208 (*) 70 - 99 mg/dL   Comment 1 Documented in Chart     Comment 2 Notify RN    GLUCOSE, CAPILLARY     Status: Abnormal   Collection Time    07/11/13  7:55 AM      Result Value Ref Range   Glucose-Capillary 210 (*) 70 - 99 mg/dL   Comment 1 Notify RN     Comment 2 Documented in Chart    GLUCOSE, CAPILLARY     Status: Abnormal   Collection Time    07/11/13 12:09 PM      Result Value Ref Range   Glucose-Capillary 220 (*) 70 - 99 mg/dL   Comment 1 Notify RN     Comment  2 Documented in Chart    GLUCOSE, CAPILLARY     Status: Abnormal   Collection Time    07/11/13  4:23 PM      Result Value Ref Range   Glucose-Capillary 160 (*) 70 - 99 mg/dL   Comment 1 Notify RN     Comment 2 Documented in Chart    GLUCOSE, CAPILLARY     Status: Abnormal   Collection Time    07/11/13  8:30 PM      Result Value Ref Range   Glucose-Capillary 153 (*) 70 - 99 mg/dL  GLUCOSE, CAPILLARY     Status: Abnormal   Collection Time    07/12/13 12:22 AM      Result Value Ref Range   Glucose-Capillary 166 (*) 70 - 99 mg/dL  GLUCOSE, CAPILLARY     Status: Abnormal   Collection Time    07/12/13  4:34 AM       Result Value Ref Range   Glucose-Capillary 145 (*) 70 - 99 mg/dL  CBC WITH DIFFERENTIAL     Status: Abnormal   Collection Time    07/12/13  5:35 AM      Result Value Ref Range   WBC 8.7  4.0 - 10.5 K/uL   RBC 4.72  3.87 - 5.11 MIL/uL   Hemoglobin 12.2  12.0 - 15.0 g/dL   HCT 38.3  36.0 - 46.0 %   MCV 81.1  78.0 - 100.0 fL   MCH 25.8 (*) 26.0 - 34.0 pg   MCHC 31.9  30.0 - 36.0 g/dL   RDW 16.7 (*) 11.5 - 15.5 %   Platelets 262  150 - 400 K/uL   Neutrophils Relative % 52  43 - 77 %   Neutro Abs 4.5  1.7 - 7.7 K/uL   Lymphocytes Relative 33  12 - 46 %   Lymphs Abs 2.9  0.7 - 4.0 K/uL   Monocytes Relative 14 (*) 3 - 12 %   Monocytes Absolute 1.2 (*) 0.1 - 1.0 K/uL   Eosinophils Relative 1  0 - 5 %   Eosinophils Absolute 0.1  0.0 - 0.7 K/uL   Basophils Relative 0  0 - 1 %   Basophils Absolute 0.0  0.0 - 0.1 K/uL  COMPREHENSIVE METABOLIC PANEL     Status: Abnormal   Collection Time    07/12/13  5:35 AM      Result Value Ref Range   Sodium 134 (*) 137 - 147 mEq/L   Potassium 3.7  3.7 - 5.3 mEq/L   Chloride 92 (*) 96 - 112 mEq/L   CO2 29  19 - 32 mEq/L   Glucose, Bld 142 (*) 70 - 99 mg/dL   BUN 21  6 - 23 mg/dL   Comment: DELTA CHECK NOTED     REPEATED TO VERIFY   Creatinine, Ser 1.08  0.50 - 1.10 mg/dL   Calcium 8.6  8.4 - 10.5 mg/dL   Total Protein 7.5  6.0 - 8.3 g/dL   Albumin 3.2 (*) 3.5 - 5.2 g/dL   AST 18  0 - 37 U/L   ALT 19  0 - 35 U/L   Alkaline Phosphatase 62  39 - 117 U/L   Total Bilirubin 0.3  0.3 - 1.2 mg/dL   GFR calc non Af Amer 58 (*) >90 mL/min   GFR calc Af Amer 67 (*) >90 mL/min   Comment: (NOTE)     The eGFR has been calculated using the CKD EPI equation.  This calculation has not been validated in all clinical situations.     eGFR's persistently <90 mL/min signify possible Chronic Kidney     Disease.  MAGNESIUM     Status: None   Collection Time    07/12/13  5:35 AM      Result Value Ref Range   Magnesium 2.5  1.5 - 2.5 mg/dL  GLUCOSE,  CAPILLARY     Status: Abnormal   Collection Time    07/12/13  7:49 AM      Result Value Ref Range   Glucose-Capillary 132 (*) 70 - 99 mg/dL  GLUCOSE, CAPILLARY     Status: Abnormal   Collection Time    07/12/13 11:23 AM      Result Value Ref Range   Glucose-Capillary 122 (*) 70 - 99 mg/dL  GLUCOSE, CAPILLARY     Status: Abnormal   Collection Time    07/12/13  4:54 PM      Result Value Ref Range   Glucose-Capillary 135 (*) 70 - 99 mg/dL   Labs are reviewed.  Current Facility-Administered Medications  Medication Dose Route Frequency Provider Last Rate Last Dose  . albuterol (PROVENTIL) (2.5 MG/3ML) 0.083% nebulizer solution 5 mg  5 mg Nebulization Q4H PRN Berle Mull, MD   5 mg at 07/11/13 1032  . carvedilol (COREG) tablet 25 mg  25 mg Oral BID WC Berle Mull, MD   25 mg at 07/12/13 0829  . diazepam (VALIUM) tablet 5 mg  5 mg Oral Q8H PRN Berle Mull, MD   5 mg at 07/12/13 1117  . fluticasone (FLONASE) 50 MCG/ACT nasal spray 2 spray  2 spray Each Nare Daily Berle Mull, MD   2 spray at 07/12/13 1000  . insulin aspart (novoLOG) injection 0-20 Units  0-20 Units Subcutaneous 6 times per day Allie Bossier, MD   3 Units at 07/12/13 1302  . insulin aspart (novoLOG) injection 8 Units  8 Units Subcutaneous TID WC Allie Bossier, MD      . insulin glargine (LANTUS) injection 55 Units  55 Units Subcutaneous QHS Berle Mull, MD   55 Units at 07/11/13 2215  . ipratropium-albuterol (DUONEB) 0.5-2.5 (3) MG/3ML nebulizer solution 3 mL  3 mL Nebulization Q6H PRN Berle Mull, MD   3 mL at 07/10/13 2033  . metoCLOPramide (REGLAN) injection 10 mg  10 mg Intravenous 3 times per day Allie Bossier, MD   10 mg at 07/11/13 1839  . nitroGLYCERIN (NITROSTAT) SL tablet 0.4 mg  0.4 mg Sublingual Q5 min PRN Berle Mull, MD      . ondansetron (ZOFRAN) 8 mg/NS 50 ml IVPB  8 mg Intravenous Q8H PRN Berle Mull, MD   8 mg at 07/11/13 0233  . pantoprazole (PROTONIX) injection 40 mg  40 mg Intravenous Q12H Berle Mull, MD   40 mg at 07/11/13 1014  . Rivaroxaban (XARELTO) tablet 20 mg  20 mg Oral Q supper Emiliano Dyer, RPH   20 mg at 07/11/13 1633  . simvastatin (ZOCOR) tablet 40 mg  40 mg Oral q1800 Berle Mull, MD   40 mg at 07/11/13 1632  . sodium chloride 0.9 % injection 3 mL  3 mL Intravenous Q12H Berle Mull, MD   3 mL at 07/11/13 1014  . tiZANidine (ZANAFLEX) tablet 2 mg  2 mg Oral Q6H PRN Berle Mull, MD   2 mg at 07/12/13 0026  . traZODone (DESYREL) tablet 100 mg  100 mg Oral QHS Berle Mull, MD  100 mg at 07/11/13 2214    Psychiatric Specialty Exam:     Blood pressure 135/60, pulse 66, temperature 97.8 F (36.6 C), temperature source Oral, resp. rate 18, height 5' 7.5" (1.715 m), weight 295 lb 10.2 oz (134.1 kg), SpO2 98.00%.Body mass index is 45.59 kg/(m^2).  General Appearance: Fairly Groomed and Tired  Engineer, water::  Fair  Speech:  Slow  Volume:  Decreased  Mood:  Anxious and Depressed  Affect:  Appropriate  Thought Process:  Slowed but logical  Orientation:  Full (Time, Place, and Person)  Thought Content:  WDL and Rumination  Suicidal Thoughts:  No  Homicidal Thoughts:  No  Memory:  Immediate;   Fair Recent;   Fair Remote;   Fair  Judgement:  Intact  Insight:  Good  Psychomotor Activity:  Decreased  Concentration:  Fair  Recall:  [employer] of Knowledge:Fair  Language: Fair  Akathisia:  No  Handed:  Right  AIMS (if indicated):     Assets:  Communication Skills Desire for Improvement Housing Social Support  Sleep:      Musculoskeletal: Strength & Muscle Tone: within normal limits, obese Gait & Station: patient lying on bed Patient leans: Patient lying on bed  Treatment Plan Summary: Medication management At this time patient does not want any medication which causes for side effects since patient is already experiencing nausea vomiting and physical illness.  She has insomnia, crying spells but she believes because of physical illness.  In the past she  had tried Haldol, Abilify, Seroquel but she developed side effects.  She reported her symptoms are much better in the past 7 months.  She is more involved in church.  She wants to continue trazodone however she agreed if the symptoms does not get better or improved and she will see the psychiatrist.  Patient does not have any suicidal thoughts or homicidal thoughts and she can be seen in outpatient at [facility].  Social worker please arranged outpatient referrals.  Please call [phone] if you have any questions.   Jeniah Kishi T. 07/12/2013 5:28 PM

## 2013-07-13 ENCOUNTER — Inpatient Hospital Stay (HOSPITAL_COMMUNITY): Payer: Medicaid Other

## 2013-07-13 DIAGNOSIS — G4733 Obstructive sleep apnea (adult) (pediatric): Secondary | ICD-10-CM

## 2013-07-13 LAB — GLUCOSE, CAPILLARY
Glucose-Capillary: 116 mg/dL — ABNORMAL HIGH (ref 70–99)
Glucose-Capillary: 116 mg/dL — ABNORMAL HIGH (ref 70–99)
Glucose-Capillary: 123 mg/dL — ABNORMAL HIGH (ref 70–99)
Glucose-Capillary: 97 mg/dL (ref 70–99)
Glucose-Capillary: 84 mg/dL (ref 70–99)
Glucose-Capillary: 112 mg/dL — ABNORMAL HIGH (ref 70–99)

## 2013-07-13 LAB — CBC WITH DIFFERENTIAL/PLATELET
BASOS PCT: 0 % (ref 0–1)
Basophils Absolute: 0 10*3/uL (ref 0.0–0.1)
Basophils Absolute: 0 10*3/uL (ref 0.0–0.1)
Basophils Relative: 0 % (ref 0–1)
EOS ABS: 0.1 10*3/uL (ref 0.0–0.7)
EOS PCT: 1 % (ref 0–5)
Eosinophils Absolute: 0.1 10*3/uL (ref 0.0–0.7)
Eosinophils Relative: 1 % (ref 0–5)
HCT: 39.4 % (ref 36.0–46.0)
HEMATOCRIT: 41.2 % (ref 36.0–46.0)
HEMOGLOBIN: 13.5 g/dL (ref 12.0–15.0)
Hemoglobin: 12.6 g/dL (ref 12.0–15.0)
LYMPHS ABS: 3.3 10*3/uL (ref 0.7–4.0)
LYMPHS PCT: 37 % (ref 12–46)
LYMPHS PCT: 38 % (ref 12–46)
Lymphs Abs: 3.7 10*3/uL (ref 0.7–4.0)
MCH: 26.2 pg (ref 26.0–34.0)
MCH: 25.9 pg — ABNORMAL LOW (ref 26.0–34.0)
MCHC: 32.8 g/dL (ref 30.0–36.0)
MCHC: 32 g/dL (ref 30.0–36.0)
MCV: 79.8 fL (ref 78.0–100.0)
MCV: 80.9 fL (ref 78.0–100.0)
MONO ABS: 1.1 10*3/uL — AB (ref 0.1–1.0)
MONO ABS: 1.1 10*3/uL — AB (ref 0.1–1.0)
MONOS PCT: 12 % (ref 3–12)
Monocytes Relative: 11 % (ref 3–12)
NEUTROS ABS: 4.9 10*3/uL (ref 1.7–7.7)
NEUTROS PCT: 50 % (ref 43–77)
Neutro Abs: 4.4 10*3/uL (ref 1.7–7.7)
Neutrophils Relative %: 50 % (ref 43–77)
Platelets: 307 10*3/uL (ref 150–400)
Platelets: 315 10*3/uL (ref 150–400)
RBC: 5.16 MIL/uL — AB (ref 3.87–5.11)
RBC: 4.87 MIL/uL (ref 3.87–5.11)
RDW: 16.7 % — ABNORMAL HIGH (ref 11.5–15.5)
RDW: 16.6 % — ABNORMAL HIGH (ref 11.5–15.5)
WBC: 8.9 10*3/uL (ref 4.0–10.5)
WBC: 9.8 10*3/uL (ref 4.0–10.5)

## 2013-07-13 LAB — COMPREHENSIVE METABOLIC PANEL
ALBUMIN: 3.4 g/dL — AB (ref 3.5–5.2)
ALK PHOS: 61 U/L (ref 39–117)
ALK PHOS: 59 U/L (ref 39–117)
ALT: 17 U/L (ref 0–35)
ALT: 16 U/L (ref 0–35)
AST: 16 U/L (ref 0–37)
AST: 16 U/L (ref 0–37)
Albumin: 3.4 g/dL — ABNORMAL LOW (ref 3.5–5.2)
BILIRUBIN TOTAL: 0.3 mg/dL (ref 0.3–1.2)
BUN: 19 mg/dL (ref 6–23)
BUN: 21 mg/dL (ref 6–23)
CALCIUM: 9.1 mg/dL (ref 8.4–10.5)
CHLORIDE: 92 meq/L — AB (ref 96–112)
CO2: 30 mEq/L (ref 19–32)
CO2: 30 meq/L (ref 19–32)
CREATININE: 1.15 mg/dL — AB (ref 0.50–1.10)
Calcium: 8.9 mg/dL (ref 8.4–10.5)
Chloride: 90 mEq/L — ABNORMAL LOW (ref 96–112)
Creatinine, Ser: 1.07 mg/dL (ref 0.50–1.10)
GFR calc Af Amer: 62 mL/min — ABNORMAL LOW (ref >90)
GFR, EST AFRICAN AMERICAN: 67 mL/min — AB (ref >90)
GFR, EST NON AFRICAN AMERICAN: 58 mL/min — AB (ref >90)
GFR, EST NON AFRICAN AMERICAN: 53 mL/min — AB (ref >90)
GLUCOSE: 110 mg/dL — AB (ref 70–99)
Glucose, Bld: 90 mg/dL (ref 70–99)
POTASSIUM: 3.6 meq/L — AB (ref 3.7–5.3)
Potassium: 3.7 mEq/L (ref 3.7–5.3)
Sodium: 132 mEq/L — ABNORMAL LOW (ref 137–147)
Sodium: 134 mEq/L — ABNORMAL LOW (ref 137–147)
TOTAL PROTEIN: 7.9 g/dL (ref 6.0–8.3)
Total Bilirubin: 0.3 mg/dL (ref 0.3–1.2)
Total Protein: 7.6 g/dL (ref 6.0–8.3)

## 2013-07-13 LAB — MAGNESIUM
Magnesium: 2.3 mg/dL (ref 1.5–2.5)
Magnesium: 2.3 mg/dL (ref 1.5–2.5)

## 2013-07-13 MED ORDER — ONDANSETRON HCL 4 MG/2ML IJ SOLN
4.0000 mg | Freq: Four times a day (QID) | INTRAMUSCULAR | Status: DC | PRN
  Administered 2013-07-13 – 2013-07-15 (×3): 4 mg via INTRAVENOUS
  Filled 2013-07-13 (×3): qty 2

## 2013-07-13 MED ORDER — PROMETHAZINE HCL 25 MG/ML IJ SOLN: 12.5000 mg | Freq: Once | INTRAMUSCULAR | Status: DC

## 2013-07-13 MED ORDER — TECHNETIUM TC 99M SULFUR COLLOID
2.2000 | Freq: Once | INTRAVENOUS | Status: AC | PRN
  Administered 2013-07-13: 2.2 via ORAL

## 2013-07-13 MED ORDER — METOCLOPRAMIDE HCL 5 MG/ML IJ SOLN
10.0000 mg | Freq: Three times a day (TID) | INTRAMUSCULAR | Status: DC | PRN
  Administered 2013-07-14 – 2013-07-15 (×4): 10 mg via INTRAVENOUS
  Filled 2013-07-13 (×4): qty 2

## 2013-07-13 MED ORDER — SODIUM CHLORIDE 0.9 % IJ SOLN
10.0000 mL | INTRAMUSCULAR | Status: DC | PRN
  Administered 2013-07-14 – 2013-07-16 (×4): 10 mL

## 2013-07-13 MED ORDER — SODIUM CHLORIDE 0.9 % IV SOLN
INTRAVENOUS | Status: DC
  Administered 2013-07-13: 1000 mL via INTRAVENOUS
  Administered 2013-07-14 – 2013-07-15 (×2): via INTRAVENOUS
  Administered 2013-07-15: 1000 mL via INTRAVENOUS
  Administered 2013-07-16: 06:00:00 via INTRAVENOUS

## 2013-07-13 MED ORDER — CHLORPROMAZINE HCL 25 MG/ML IJ SOLN
25.0000 mg | Freq: Once | INTRAMUSCULAR | Status: AC
  Administered 2013-07-13: 25 mg via INTRAMUSCULAR
  Filled 2013-07-13: qty 1

## 2013-07-13 NOTE — Progress Notes (Signed)
[facility] PROGRESS NOTE  Tamara Silva [id number] DOB: [birth year] DOA: 07/10/2013 PCP: Philis Fendt, MD  Assessment/Plan:   Nausea and vomiting/? Acute GE  - Continued Refractory N/V with Max antiemetics and PPIs. -Unable to tolerate any diet. Counseled most likely secondary to diabetic gastroparesis. Spoke with Dr. Collene Mares from [place], and she would like first to conduct a gastric emptying study prior to considering EGD.     -Chlorpromazine 25 mg IV times one 30 minutes prior to gastric emptying study -Change Zofran to IV -Change Reglan to IV   Acute renal failure  - Creatinine went up from 1.04 on 2/5 to 2.04 on 2/10. Likely secondary to dehydration/prerenal in the context of acute GE, diuretics and ACE inhibitors.  - 2/12 creatinine=1.08 -2/13 Restart normal saline 175ml/hr; monitor closely for fluid overload  Chronic combined CHF  - Compensated. Fluid management as above. Monitor closely -ProBNP 07/01/2013= 1194 -2/10 weight= 137.4 kg and, Current weight= 134.1 kg standing  History of pulmonary embolism  - Continue Xarelto per pharmacy.  -DC Plavix secondary to increased risk of bleeding when combined with Xarelto  Hypertension  - Within ADA guidelines  -` Continue Coreg 25 mg BID   History of CAD  - Asymptomatic. Continue Plavix.   Type II DM, uncontrolled  - Advanced to diabetic diet as tolerated. Continue Lantus and SSI -Hemoglobin A1c 07/01/2013= 8.9  Bipolar disease -Not on medication; -Psychiatry recommendations are to wait for patient to be seen outpatient. -Will speak with patient on starting Prozac 20 mg  OSA -CPAP per respiratory   Code Status: Full Family Communication: No family available  Disposition Plan: Resolution N./V., better control   Consultants: Dr Berniece Andreas (psychiatry)    Procedures: CXR 07/10/2013 Chronic interstitial prominence/scarring in the left mid lung is  unchanged. No free intraperitoneal air is seen  on the upright view.  The bowel gas pattern is nonobstructive. No significant stool burden  noted. Several pelvic phleboliths are seen. No definite urinary  tract stones. No acute osseous abnormality.   Antibiotics:    HPI/Subjective: Tamara Silva is a 53 y.o.BF PMHx  chronic diastolic dysfunction, pulmonary embolism, diabetes mellitus, hypertension, COPD, coronary artery disease, morbid obesity, obstructive sleep apnea.  The patient is coming from home.  The patient presented with complaints of nausea and vomiting .she mentions that she was given one dose of metolazone in the hospital and then discharged on the same home.after taking the medication at home she started having episodes of nausea and vomiting.she had nonbloody with this vomiting x5 last 24 hours associated with acid reflux.she denies any fever, chills, headache, cough, chest pain, palpitation, shortness of breath, orthopnea, PND, diarrhea, constipation, active bleeding, burning urination, dizziness, pedal edema, focal neurological deficit.  She continues to take the medication despite her nausea.she has poor appetite. She has lost 10 pounds since discharge 2/11 continues to complain of N./V. with clear fluids, does not know dry weight. States understands that she has poor control all diabetes. 2/12 although medical staff has observed patient throughout day and patient has not had N/V. except on 1- 2 occasions patient states has been positive N./V. all day. Continues to dry heave. Positive flatus, negative BM, 2/13 patient states feeling of nausea has decreased somewhat. Was able to tolerate sitting on ice chips overnight   Objective: Filed Vitals:   07/12/13 2129 07/13/13 0500 07/13/13 0621 07/13/13 0758  BP: 109/61  95/55 94/52  Pulse: 64  68 84  Temp: 98.1 F (36.7 C)  98  F (36.7 C) 97.4 F (36.3 C)  TempSrc: Oral  Oral Oral  Resp: 18  18 18   Height:      Weight:  134.1 kg (295 lb 10.2 oz)    SpO2: 96%  96% 95%     Intake/Output Summary (Last 24 hours) at 07/13/13 1140 Last data filed at 07/13/13 0700  Gross per 24 hour  Intake      0 ml  Output   1050 ml  Net  -1050 ml   Filed Weights   07/10/13 0635 07/12/13 0443 07/13/13 0500  Weight: 137.44 kg (303 lb) 134.1 kg (295 lb 10.2 oz) 134.1 kg (295 lb 10.2 oz)    Exam:   General:  A./O. x4, NAD  Cardiovascular: Regular rhythm and rate, negative murmurs rubs, gallops  Respiratory: Diffuse wheezing  Abdomen: Soft nontender nondistended plus hypoactive bowel sound  Musculoskeletal: Negative pedal edema   Data Reviewed: Basic Metabolic Panel:  Recent Labs Lab 07/10/13 0310 07/10/13 0810 07/11/13 0535 07/12/13 0535 07/13/13 0440  NA 131* 132* 133* 134* 132*  K 4.3 4.5 4.3 3.7 3.7  CL 85* 88* 91* 92* 90*  CO2 35* 31 33* 29 30  GLUCOSE 192* 246* 242* 142* 110*  BUN 63* 58* 37* 21 19  CREATININE 2.04* 1.59* 1.23* 1.08 1.07  CALCIUM 8.6 8.5 8.6 8.6 9.1  MG  --   --   --  2.5 2.3   Liver Function Tests:  Recent Labs Lab 07/10/13 0810 07/12/13 0535 07/13/13 0440  AST 22 18 16   ALT 20 19 17   ALKPHOS 86 62 61  BILITOT 0.2* 0.3 0.3  PROT 7.6 7.5 7.9  ALBUMIN 3.3* 3.2* 3.4*    Recent Labs Lab 07/10/13 0810  LIPASE 25  AMYLASE 19   No results found for this basename: AMMONIA,  in the last 168 hours CBC:  Recent Labs Lab 07/10/13 0310 07/12/13 0535 07/13/13 0440  WBC 10.2 8.7 8.9  NEUTROABS 6.2 4.5 4.4  HGB 12.3 12.2 13.5  HCT 38.0 38.3 41.2  MCV 79.2 81.1 79.8  PLT 334 262 307   Cardiac Enzymes: No results found for this basename: CKTOTAL, CKMB, CKMBINDEX, TROPONINI,  in the last 168 hours BNP (last 3 results)  Recent Labs  06/24/13 1838 06/29/13 1430 07/01/13 0235  PROBNP 256.4* 770.4* 1194.0*   CBG:  Recent Labs Lab 07/12/13 1123 07/12/13 1654 07/12/13 2011 07/12/13 2223 07/13/13 0407  GLUCAP 122* 135* 116* 116* 123*    No results found for this or any previous visit (from the past 240  hour(s)).   Studies: No results found.  Scheduled Meds: . carvedilol  25 mg Oral BID WC  . chlorproMAZINE  25 mg Intramuscular Once  . fluticasone  2 spray Each Nare Daily  . insulin aspart  0-20 Units Subcutaneous 6 times per day  . insulin aspart  8 Units Subcutaneous TID WC  . insulin glargine  55 Units Subcutaneous QHS  . metoCLOPramide  10 mg Oral TID AC & HS  . pantoprazole (PROTONIX) IV  40 mg Intravenous Q12H  . promethazine  12.5 mg Intravenous Once  . rivaroxaban  20 mg Oral Q supper  . simvastatin  40 mg Oral q1800  . sodium chloride  3 mL Intravenous Q12H  . traZODone  100 mg Oral QHS   Continuous Infusions:   Principal Problem:   Intractable nausea and vomiting Active Problems:   COPD exacerbation   Chronic anticoagulation   Coronary artery disease   Chronic  pulmonary embolism   Tobacco abuse   Bipolar disorder   Essential hypertension   Insulin dependent diabetes mellitus   Acute renal failure   Chronic diastolic congestive heart failure    Time spent: 23minutes    Allie Bossier  [facility] Pager [phone]. If 7PM-7AM, please contact night-coverage at [email or web address], password [facility] 07/13/2013, 11:40 AM  LOS: 3 days

## 2013-07-13 NOTE — Progress Notes (Deleted)
Assess Rt arm Midline.  Large amount of liquid drainage noted under dressing.  Midline leaks while being flushed.    If pt needs line for home antibiotics recommend replacing midline.  Floor RN to notify MD.

## 2013-07-13 NOTE — Progress Notes (Signed)
ANTICOAGULATION CONSULT NOTE - Follow Up  Pharmacy Consult for Xarelto Indication: Hx PE  Allergies  Allergen Reactions  . Nsaids Other (See Comments)    "I do not take NSAIDs d/t interfering with other meds"  . Metolazone Nausea And Vomiting    Side effect.    Patient Measurements: Height: 5' 7.5" (171.5 cm) Weight: 295 lb 10.2 oz (134.1 kg) IBW/kg (Calculated) : 62.75  Vital Signs: Temp: 97.4 F (36.3 C) (02/13 0758) Temp src: Oral (02/13 0758) BP: 94/52 mmHg (02/13 0758) Pulse Rate: 84 (02/13 0758)  Labs:  Recent Labs  07/11/13 0535 07/12/13 0535 07/13/13 0440  HGB  --  12.2 13.5  HCT  --  38.3 41.2  PLT  --  262 307  CREATININE 1.23* 1.08 1.07    Estimated Creatinine Clearance: 87.6 ml/min (by C-G formula based on Cr of 1.07).   Medical History: Past Medical History  Diagnosis Date  . Diabetes mellitus without complication   . PE (pulmonary embolism)   . Hypercholesterolemia   . Hypertension   . COPD (chronic obstructive pulmonary disease)   . Asthma   . Coronary artery disease   . CHF (congestive heart failure)   . Mental disorder     BIPOLAR  . Shortness of breath   . GERD (gastroesophageal reflux disease)   . Neuromuscular disorder     DIABETIC NEUROPATHY  . Ischemic cardiomyopathy   . Bipolar disorder   . Post-traumatic stress syndrome     Medications:  Scheduled:  . carvedilol  25 mg Oral BID WC  . fluticasone  2 spray Each Nare Daily  . insulin aspart  0-20 Units Subcutaneous 6 times per day  . insulin aspart  8 Units Subcutaneous TID WC  . insulin glargine  55 Units Subcutaneous QHS  . metoCLOPramide  10 mg Oral TID AC & HS  . pantoprazole (PROTONIX) IV  40 mg Intravenous Q12H  . promethazine  12.5 mg Intravenous Once  . rivaroxaban  20 mg Oral Q supper  . simvastatin  40 mg Oral q1800  . sodium chloride  3 mL Intravenous Q12H  . traZODone  100 mg Oral QHS    Assessment: 53 yo female on chronic Xarelto for history of PE.  Admitted 2/10 with nausea and vomiting, possibly adverse effect of new medication start.  SCr improved from admission baseline and now stable - est CrCl 88 ml/min  CBC ok, no bleeding reported  Concurrent plavix noted with hx MI and stents  No drug interactions noted  Goal of Therapy:  Therapeutic anticoagulation Monitor platelets by anticoagulation protocol: Yes   Plan:   Continue Xarelto 20mg  PO daily with supper  Monitor renal function closely - if CrCl falls < 58ml/hr, xarelto use should be avoided  Will sign off. Thank you for the consult  Adrian Saran, PharmD, BCPS Pager [phone] 07/13/2013 8:54 AM

## 2013-07-13 NOTE — Progress Notes (Signed)
Clinical Social Work  CSW attempted to meet with patient to complete psychosocial assessment. Per chart review, psych MD made medication recommendations and requested CSW provide outpatient referrals. CSW went to room but patient out of room for procedure. CSW will follow up at later time to complete assessment.  [place], [place] [phone]

## 2013-07-13 NOTE — Progress Notes (Signed)
Peripherally Inserted Central Catheter/Midline Placement  The IV Nurse has discussed with the patient and/or persons authorized to consent for the patient, the purpose of this procedure and the potential benefits and risks involved with this procedure.  The benefits include less needle sticks, lab draws from the catheter and patient may be discharged home with the catheter.  Risks include, but not limited to, infection, bleeding, blood clot (thrombus formation), and puncture of an artery; nerve damage and irregular heat beat.  Alternatives to this procedure were also discussed.  PICC/Midline Placement Documentation        Tamara Silva 07/13/2013, 10:24 AM

## 2013-07-13 NOTE — Consult Note (Signed)
Unassigned patient Reason for Consult: Nausea and vomiting. Referring Physician: [facility]-Dr. Sherral Hammers.  Tamara Silva is an 53 y.o. female.  HPI: 53 year old, black female with multiple medical problems listed below, admitted with chronic nausea and vomiting. She had a gastric emptying study done today that revealed a 47% retention of nuclear tracer in 2 hours. She is currently resting comfortable with a C-PAP on.   Past Medical History  Diagnosis Date  . Diabetes mellitus without complication   . PE (pulmonary embolism)   . Hypercholesterolemia   . Hypertension   . COPD (chronic obstructive pulmonary disease)   . Asthma   . Coronary artery disease   . CHF (congestive heart failure)   . Mental disorder     BIPOLAR  . Shortness of breath   . GERD (gastroesophageal reflux disease)   . Neuromuscular disorder     DIABETIC NEUROPATHY  . Ischemic cardiomyopathy   . Bipolar disorder   . Post-traumatic stress syndrome    Past Surgical History  Procedure Laterality Date  . Coronary angioplasty with stent placement      CAD in 2006 x 2 and 2009 2 more- place din REx in [place] and  med   Family History  Problem Relation Age of Onset  . Stroke Mother   . Cancer Father    Social History:  reports that she quit smoking about 7 months ago. Her smoking use included Cigarettes. She smoked 0.00 packs per day. She has never used smokeless tobacco. She reports that she does not drink alcohol or use illicit drugs.  Allergies:  Allergies  Allergen Reactions  . Nsaids Other (See Comments)    "I do not take NSAIDs d/t interfering with other meds"  . Metolazone Nausea And Vomiting    Side effect.   Medications: I have reviewed the patient's current medications.  Results for orders placed during the hospital encounter of 07/10/13 (from the past 48 hour(s))  GLUCOSE, CAPILLARY     Status: Abnormal   Collection Time    07/11/13  8:30 PM      Result Value Ref Range   Glucose-Capillary 153 (*)  70 - 99 mg/dL  GLUCOSE, CAPILLARY     Status: Abnormal   Collection Time    07/12/13 12:22 AM      Result Value Ref Range   Glucose-Capillary 166 (*) 70 - 99 mg/dL  GLUCOSE, CAPILLARY     Status: Abnormal   Collection Time    07/12/13  4:34 AM      Result Value Ref Range   Glucose-Capillary 145 (*) 70 - 99 mg/dL  CBC WITH DIFFERENTIAL     Status: Abnormal   Collection Time    07/12/13  5:35 AM      Result Value Ref Range   WBC 8.7  4.0 - 10.5 K/uL   RBC 4.72  3.87 - 5.11 MIL/uL   Hemoglobin 12.2  12.0 - 15.0 g/dL   HCT 38.3  36.0 - 46.0 %   MCV 81.1  78.0 - 100.0 fL   MCH 25.8 (*) 26.0 - 34.0 pg   MCHC 31.9  30.0 - 36.0 g/dL   RDW 16.7 (*) 11.5 - 15.5 %   Platelets 262  150 - 400 K/uL   Neutrophils Relative % 52  43 - 77 %   Neutro Abs 4.5  1.7 - 7.7 K/uL   Lymphocytes Relative 33  12 - 46 %   Lymphs Abs 2.9  0.7 - 4.0 K/uL  Monocytes Relative 14 (*) 3 - 12 %   Monocytes Absolute 1.2 (*) 0.1 - 1.0 K/uL   Eosinophils Relative 1  0 - 5 %   Eosinophils Absolute 0.1  0.0 - 0.7 K/uL   Basophils Relative 0  0 - 1 %   Basophils Absolute 0.0  0.0 - 0.1 K/uL  COMPREHENSIVE METABOLIC PANEL     Status: Abnormal   Collection Time    07/12/13  5:35 AM      Result Value Ref Range   Sodium 134 (*) 137 - 147 mEq/L   Potassium 3.7  3.7 - 5.3 mEq/L   Chloride 92 (*) 96 - 112 mEq/L   CO2 29  19 - 32 mEq/L   Glucose, Bld 142 (*) 70 - 99 mg/dL   BUN 21  6 - 23 mg/dL   Comment: DELTA CHECK NOTED     REPEATED TO VERIFY   Creatinine, Ser 1.08  0.50 - 1.10 mg/dL   Calcium 8.6  8.4 - 10.5 mg/dL   Total Protein 7.5  6.0 - 8.3 g/dL   Albumin 3.2 (*) 3.5 - 5.2 g/dL   AST 18  0 - 37 U/L   ALT 19  0 - 35 U/L   Alkaline Phosphatase 62  39 - 117 U/L   Total Bilirubin 0.3  0.3 - 1.2 mg/dL   GFR calc non Af Amer 58 (*) >90 mL/min   GFR calc Af Amer 67 (*) >90 mL/min   Comment: (NOTE)     The eGFR has been calculated using the CKD EPI equation.     This calculation has not been validated in  all clinical situations.     eGFR's persistently <90 mL/min signify possible Chronic Kidney     Disease.  MAGNESIUM     Status: None   Collection Time    07/12/13  5:35 AM      Result Value Ref Range   Magnesium 2.5  1.5 - 2.5 mg/dL  GLUCOSE, CAPILLARY     Status: Abnormal   Collection Time    07/12/13  7:49 AM      Result Value Ref Range   Glucose-Capillary 132 (*) 70 - 99 mg/dL  GLUCOSE, CAPILLARY     Status: Abnormal   Collection Time    07/12/13 11:23 AM      Result Value Ref Range   Glucose-Capillary 122 (*) 70 - 99 mg/dL  GLUCOSE, CAPILLARY     Status: Abnormal   Collection Time    07/12/13  4:54 PM      Result Value Ref Range   Glucose-Capillary 135 (*) 70 - 99 mg/dL  GLUCOSE, CAPILLARY     Status: Abnormal   Collection Time    07/12/13  8:11 PM      Result Value Ref Range   Glucose-Capillary 116 (*) 70 - 99 mg/dL   Comment 1 Documented in Chart     Comment 2 Notify RN    GLUCOSE, CAPILLARY     Status: Abnormal   Collection Time    07/12/13 10:23 PM      Result Value Ref Range   Glucose-Capillary 116 (*) 70 - 99 mg/dL   Comment 1 Documented in Chart     Comment 2 Notify RN    GLUCOSE, CAPILLARY     Status: Abnormal   Collection Time    07/13/13  4:07 AM      Result Value Ref Range   Glucose-Capillary 123 (*) 70 - 99 mg/dL  Comment 1 Documented in Chart     Comment 2 Notify RN    CBC WITH DIFFERENTIAL     Status: Abnormal   Collection Time    07/13/13  4:40 AM      Result Value Ref Range   WBC 8.9  4.0 - 10.5 K/uL   RBC 5.16 (*) 3.87 - 5.11 MIL/uL   Hemoglobin 13.5  12.0 - 15.0 g/dL   HCT 41.2  36.0 - 46.0 %   MCV 79.8  78.0 - 100.0 fL   MCH 26.2  26.0 - 34.0 pg   MCHC 32.8  30.0 - 36.0 g/dL   RDW 16.7 (*) 11.5 - 15.5 %   Platelets 307  150 - 400 K/uL   Neutrophils Relative % 50  43 - 77 %   Neutro Abs 4.4  1.7 - 7.7 K/uL   Lymphocytes Relative 37  12 - 46 %   Lymphs Abs 3.3  0.7 - 4.0 K/uL   Monocytes Relative 12  3 - 12 %   Monocytes Absolute 1.1  (*) 0.1 - 1.0 K/uL   Eosinophils Relative 1  0 - 5 %   Eosinophils Absolute 0.1  0.0 - 0.7 K/uL   Basophils Relative 0  0 - 1 %   Basophils Absolute 0.0  0.0 - 0.1 K/uL  COMPREHENSIVE METABOLIC PANEL     Status: Abnormal   Collection Time    07/13/13  4:40 AM      Result Value Ref Range   Sodium 132 (*) 137 - 147 mEq/L   Potassium 3.7  3.7 - 5.3 mEq/L   Chloride 90 (*) 96 - 112 mEq/L   CO2 30  19 - 32 mEq/L   Glucose, Bld 110 (*) 70 - 99 mg/dL   BUN 19  6 - 23 mg/dL   Creatinine, Ser 1.07  0.50 - 1.10 mg/dL   Calcium 9.1  8.4 - 10.5 mg/dL   Total Protein 7.9  6.0 - 8.3 g/dL   Albumin 3.4 (*) 3.5 - 5.2 g/dL   AST 16  0 - 37 U/L   ALT 17  0 - 35 U/L   Alkaline Phosphatase 61  39 - 117 U/L   Total Bilirubin 0.3  0.3 - 1.2 mg/dL   GFR calc non Af Amer 58 (*) >90 mL/min   GFR calc Af Amer 67 (*) >90 mL/min   Comment: (NOTE)     The eGFR has been calculated using the CKD EPI equation.     This calculation has not been validated in all clinical situations.     eGFR's persistently <90 mL/min signify possible Chronic Kidney     Disease.  MAGNESIUM     Status: None   Collection Time    07/13/13  4:40 AM      Result Value Ref Range   Magnesium 2.3  1.5 - 2.5 mg/dL  GLUCOSE, CAPILLARY     Status: None   Collection Time    07/13/13  7:28 AM      Result Value Ref Range   Glucose-Capillary 97  70 - 99 mg/dL  GLUCOSE, CAPILLARY     Status: None   Collection Time    07/13/13 11:28 AM      Result Value Ref Range   Glucose-Capillary 84  70 - 99 mg/dL   Comment 1 Notify RN     Comment 2 Documented in Chart    CBC WITH DIFFERENTIAL     Status: Abnormal  Collection Time    07/13/13 12:45 PM      Result Value Ref Range   WBC 9.8  4.0 - 10.5 K/uL   RBC 4.87  3.87 - 5.11 MIL/uL   Hemoglobin 12.6  12.0 - 15.0 g/dL   HCT 39.4  36.0 - 46.0 %   MCV 80.9  78.0 - 100.0 fL   MCH 25.9 (*) 26.0 - 34.0 pg   MCHC 32.0  30.0 - 36.0 g/dL   RDW 16.6 (*) 11.5 - 15.5 %   Platelets 315  150 - 400  K/uL   Neutrophils Relative % 50  43 - 77 %   Neutro Abs 4.9  1.7 - 7.7 K/uL   Lymphocytes Relative 38  12 - 46 %   Lymphs Abs 3.7  0.7 - 4.0 K/uL   Monocytes Relative 11  3 - 12 %   Monocytes Absolute 1.1 (*) 0.1 - 1.0 K/uL   Eosinophils Relative 1  0 - 5 %   Eosinophils Absolute 0.1  0.0 - 0.7 K/uL   Basophils Relative 0  0 - 1 %   Basophils Absolute 0.0  0.0 - 0.1 K/uL  COMPREHENSIVE METABOLIC PANEL     Status: Abnormal   Collection Time    07/13/13 12:45 PM      Result Value Ref Range   Sodium 134 (*) 137 - 147 mEq/L   Potassium 3.6 (*) 3.7 - 5.3 mEq/L   Chloride 92 (*) 96 - 112 mEq/L   CO2 30  19 - 32 mEq/L   Glucose, Bld 90  70 - 99 mg/dL   BUN 21  6 - 23 mg/dL   Creatinine, Ser 1.15 (*) 0.50 - 1.10 mg/dL   Calcium 8.9  8.4 - 10.5 mg/dL   Total Protein 7.6  6.0 - 8.3 g/dL   Albumin 3.4 (*) 3.5 - 5.2 g/dL   AST 16  0 - 37 U/L   ALT 16  0 - 35 U/L   Alkaline Phosphatase 59  39 - 117 U/L   Total Bilirubin 0.3  0.3 - 1.2 mg/dL   GFR calc non Af Amer 53 (*) >90 mL/min   GFR calc Af Amer 62 (*) >90 mL/min   Comment: (NOTE)     The eGFR has been calculated using the CKD EPI equation.     This calculation has not been validated in all clinical situations.     eGFR's persistently <90 mL/min signify possible Chronic Kidney     Disease.  MAGNESIUM     Status: None   Collection Time    07/13/13 12:45 PM      Result Value Ref Range   Magnesium 2.3  1.5 - 2.5 mg/dL  GLUCOSE, CAPILLARY     Status: Abnormal   Collection Time    07/13/13  4:27 PM      Result Value Ref Range   Glucose-Capillary 112 (*) 70 - 99 mg/dL    Nm Gastric Emptying  07/13/2013   CLINICAL DATA:  Refractory nausea and vomiting; diabetes mellitus  EXAM: NUCLEAR MEDICINE GASTRIC EMPTYING SCAN  Views:  LAO stomach  Radionuclide: Technetium [id number] colloid in egg  Dose:  2.2 mCi  Route of administration: Oral  COMPARISON:  None.  FINDINGS: Images and count statistics were obtained over the stomach following  oral consumption of radiolabeled solid material.  After 1 hr, there was no emptying from the stomach. After 2 hr, 47% of solid material had emptied from the stomach. Normally,  greater than 70% of solid material empties from the stomach after 2 hr. This study is regarded as abnormal.  IMPRESSION: Abnormal gastric emptying study for solid material. This finding suggests underlying gastric paresis. Differential diagnostic consideration is gastric outlet obstruction to solid material. Given the history of diabetes mellitus, gastric paresis is felt to be the most likely etiology for this abnormal finding.   Electronically Signed   By: Lowella Grip M.D.   On: 07/13/2013 15:39   Review of Systems  Constitutional: Negative.   HENT: Negative.   Eyes: Negative.   Respiratory: Negative.   Cardiovascular: Negative.   Gastrointestinal: Positive for nausea, vomiting and constipation. Negative for abdominal pain, diarrhea, blood in stool and melena.  Musculoskeletal: Positive for back pain.  Skin: Negative.   Endo/Heme/Allergies: Negative.   Psychiatric/Behavioral: Positive for depression. Negative for suicidal ideas, hallucinations, memory loss and substance abuse. The patient is nervous/anxious. The patient does not have insomnia.    Blood pressure 106/72, pulse 55, temperature 97.4 F (36.3 C), temperature source Oral, resp. rate 18, height 5' 7.5" (1.715 m), weight 134.1 kg (295 lb 10.2 oz), last menstrual period 07/06/2013, SpO2 95.00%. Physical Exam  Constitutional: She is oriented to person, place, and time. She appears well-developed and well-nourished.  HENT:  Head: Normocephalic and atraumatic.  Eyes: Conjunctivae and EOM are normal. Pupils are equal, round, and reactive to light.  Neck: Normal range of motion. Neck supple.  Cardiovascular: Normal rate and regular rhythm.   Respiratory: Effort normal and breath sounds normal.  GI: Soft. Bowel sounds are normal.  Musculoskeletal: Normal range  of motion.  Neurological: She is alert and oriented to person, place, and time.  Skin: Skin is warm and dry.  Psychiatric: She has a normal mood and affect. Her behavior is normal. Judgment and thought content normal.    Assessment/Plan: 1) Diabetic gastroparesis-chronic nausea and vomiting: on Reglan. I would hold off on doing an EGD as she is on chronic anticoagulation. He response to the prokinetics need to be monitored before we decide on whether she needs an EGD or not.  2) Chronic pulmonary embolism on chronic anticoagulation-Xarelto.  3) COPD/Tobacco use.  4) Chronic diastolic CHF.  5) CAD/Ischemic cardiomyopathy. 6) Bipolar disorder/PTSD.    7) Hyperlipidemia.  , 07/13/2013, 5:53 PM

## 2013-07-14 DIAGNOSIS — F4310 Post-traumatic stress disorder, unspecified: Secondary | ICD-10-CM

## 2013-07-14 LAB — COMPREHENSIVE METABOLIC PANEL
ALK PHOS: 57 U/L (ref 39–117)
ALT: 15 U/L (ref 0–35)
AST: 16 U/L (ref 0–37)
Albumin: 3.1 g/dL — ABNORMAL LOW (ref 3.5–5.2)
BILIRUBIN TOTAL: 0.3 mg/dL (ref 0.3–1.2)
BUN: 24 mg/dL — AB (ref 6–23)
CHLORIDE: 90 meq/L — AB (ref 96–112)
CO2: 28 meq/L (ref 19–32)
Calcium: 8.6 mg/dL (ref 8.4–10.5)
Creatinine, Ser: 1.26 mg/dL — ABNORMAL HIGH (ref 0.50–1.10)
GFR calc Af Amer: 55 mL/min — ABNORMAL LOW (ref >90)
GFR, EST NON AFRICAN AMERICAN: 48 mL/min — AB (ref >90)
GLUCOSE: 110 mg/dL — AB (ref 70–99)
POTASSIUM: 3.6 meq/L — AB (ref 3.7–5.3)
Sodium: 131 mEq/L — ABNORMAL LOW (ref 137–147)
Total Protein: 7 g/dL (ref 6.0–8.3)

## 2013-07-14 LAB — CBC WITH DIFFERENTIAL/PLATELET
BASOS PCT: 0 % (ref 0–1)
Basophils Absolute: 0 10*3/uL (ref 0.0–0.1)
Eosinophils Absolute: 0.2 10*3/uL (ref 0.0–0.7)
Eosinophils Relative: 2 % (ref 0–5)
HEMATOCRIT: 37.4 % (ref 36.0–46.0)
Hemoglobin: 12.1 g/dL (ref 12.0–15.0)
LYMPHS ABS: 3.4 10*3/uL (ref 0.7–4.0)
LYMPHS PCT: 35 % (ref 12–46)
MCH: 25.7 pg — ABNORMAL LOW (ref 26.0–34.0)
MCHC: 32.4 g/dL (ref 30.0–36.0)
MCV: 79.6 fL (ref 78.0–100.0)
MONOS PCT: 11 % (ref 3–12)
Monocytes Absolute: 1.1 10*3/uL — ABNORMAL HIGH (ref 0.1–1.0)
NEUTROS ABS: 5 10*3/uL (ref 1.7–7.7)
NEUTROS PCT: 52 % (ref 43–77)
Platelets: 266 10*3/uL (ref 150–400)
RBC: 4.7 MIL/uL (ref 3.87–5.11)
RDW: 16.5 % — ABNORMAL HIGH (ref 11.5–15.5)
WBC: 9.7 10*3/uL (ref 4.0–10.5)

## 2013-07-14 LAB — MAGNESIUM: MAGNESIUM: 2.2 mg/dL (ref 1.5–2.5)

## 2013-07-14 NOTE — Progress Notes (Signed)
Pt refuses to wear mask for the night. Pt stated it was not comfortable for her one the previous night. Pt encouraged to call RT if pt changes mind. No distress noted.

## 2013-07-14 NOTE — Progress Notes (Signed)
Clinical Social Work Department BRIEF PSYCHOSOCIAL ASSESSMENT 07/14/2013  Patient:  [facility]     Account Number:  [id number]     Admit date:  07/10/2013  Clinical Social Worker:  Levie Heritage  Date/Time:  07/14/2013 04:21 PM  Referred by:  Physician  Date Referred:  07/14/2013 Referred for  Behavioral Health Issues   Other Referral:   Interview type:  Patient Other interview type:    PSYCHOSOCIAL DATA Living Status:  FAMILY Admitted from facility:   Level of care:   Primary support name:  [place] Primary support relationship to patient:  CHILD, ADULT Degree of support available:   strong    CURRENT CONCERNS Current Concerns  Behavioral Health Issues   Other Concerns:    SOCIAL WORK ASSESSMENT / PLAN CSW met with Pt to discuss behavioral health concerns.    Pt reported that she moved here from [place] 7 months ago to be closer to her family.  She stated that she likes [place] and has enjoyed being close to her grandchildren.    Pt reported that she has been diagnosed with Bipolar, for which she has taken Depakote, Vistaril and Ability.  Pt had questions about Vistaril, specifically, and about other mood stabilizers.  CSW was able to answer some of Pt's questions and suggested that she discuss all other questions with her doctor.    Pt is familiar with [facility], as her 3 year old granddaughter sought services there, and is agreeable to receiving tx at that facility.  CSW and Pt had a long talk about staying on a medication long enough to feel its effects, as Pt admitted that she doesn't give her medications a chance to really work.    Pt stated that she is more involved in church now and that she feels better than she has in a long time.  She's ready to get her mental health in order.    CSW provided Pt with information on [facility].    Pt expressed her intent to begin seeking services at [facility].    CSW thanked Pt for her time.    No further CSW needs  identified.  CSW to sign off.   Assessment/plan status:  No Further Intervention Required Other assessment/ plan:   Information/referral to community resources:   [employer]    PATIENT'S/FAMILY'S RESPONSE TO PLAN OF CARE: Pt was calm, cooperative and pleasant.    Pt agreed that she needs to be on medication for her mental health and voiced her willingness to seek services at [facility].  Pt was coping well with her current medical situation and is ready to return home.    Pt thanked CSW for time and assistance.   Bernita Raisin, [place] Work [phone]

## 2013-07-14 NOTE — Discharge Summary (Addendum)
Physician Discharge Summary  Frannie Avery [id number] DOB: [birth year] DOA: 07/10/2013  PCP: Philis Fendt, MD  Admit date: 07/10/2013 Discharge date: 07/14/2013  Time spent: 40 minutes  Recommendations for Outpatient Follow-up:  Nausea and vomiting/? Acute GE  - Continued Refractory N/V with Max antiemetics and PPIs.  -Unable to tolerate any diet. Counseled most likely secondary to diabetic gastroparesis. Spoke with Dr. Collene Mares from [place], and she would like first to conduct a gastric emptying study prior to considering EGD. Addendum; agrees with diagnosis of DM gastroparesis -Discharge on Reglan 10mg  Qac, QHS  -Discharge on Zofran 4mg  PRN   -2/14 tolerated full liquids well will start on dysphagia 3 diet   Acute renal failure  - Creatinine went up from 1.04 on 2/5 to 2.04 on 2/10. Likely secondary to dehydration/prerenal in the context of acute GE, diuretics and ACE inhibitors.  - 2/12 creatinine=1.93; hydrate patient the next 24 hours recheck creatinine in the a.m.    Chronic combined CHF  - Compensated. Fluid management as above. Monitor closely  -ProBNP 07/01/2013= 1194  -2/10 weight= 137.4 kg and, Current weight= 134 kg standing    History of pulmonary embolism  - Continue Xarelto 20 mg daily. PCP needs to monitor her renal function closely.  -DC Plavix secondary to increased risk of bleeding when combined with Xarelto    Hypertension  - Within ADA guidelines  -` Continue Coreg 25 mg BID    History of CAD  - Asymptomatic. (Plavix. Held secondary to increased bleeding risk when combined with Xarelto) -Discharge patient only on Xarelto at that she needs to discuss with her cardiologist   Type II DM, uncontrolled  - Advanced to diabetic diet as tolerated. Continue Lantus and SSI  -Hemoglobin A1c 07/01/2013= 8.9  -Reviewed again the need to bring her diabetes under control, emphasized the peripheral neuropathy and gastroparesis showed that she had your reversible  damage to the nerves in her feet and stomach. Again counseled that these SSx would continue to worsen if she did not control -Followup with PCP  Bipolar disease  -Not on medication;  -Psychiatry recommendations are to wait for patient to be seen outpatient.  -Spoke with patient on starting Prozac 20 mg, and she agreed to start and stay on medication -Patient verified that CSW Estill Bamberg had provided material for [employer] and that she would follow through    OSA  -CPAP per respiratory     Discharge Diagnoses:  Principal Problem:   Intractable nausea and vomiting Active Problems:   COPD exacerbation   Chronic anticoagulation   Coronary artery disease   Chronic pulmonary embolism   Tobacco abuse   Bipolar disorder   Essential hypertension   Insulin dependent diabetes mellitus   Acute renal failure   Chronic diastolic congestive heart failure   Discharge Condition: Stable  Diet recommendation: low fat low sugar diet    Filed Weights   07/12/13 0443 07/13/13 0500 07/14/13 0412  Weight: 134.1 kg (295 lb 10.2 oz) 134.1 kg (295 lb 10.2 oz) 134.2 kg (295 lb 13.7 oz)    History of present illness:  Tamara Silva is a 53 y.o.BF PMHx chronic diastolic dysfunction, pulmonary embolism, diabetes mellitus, hypertension, COPD, coronary artery disease, morbid obesity, obstructive sleep apnea.  The patient is coming from home.  The patient presented with complaints of nausea and vomiting .she mentions that she was given one dose of metolazone in the hospital and then discharged on the same home.after taking the medication at home  she started having episodes of nausea and vomiting.she had nonbloody with this vomiting x5 last 24 hours associated with acid reflux.she denies any fever, chills, headache, cough, chest pain, palpitation, shortness of breath, orthopnea, PND, diarrhea, constipation, active bleeding, burning urination, dizziness, pedal edema, focal neurological  deficit.  She continues to take the medication despite her nausea.she has poor appetite. She has lost 10 pounds since discharge 2/11 continues to complain of N./V. with clear fluids, does not know dry weight. States understands that she has poor control all diabetes. 2/12 although medical staff has observed patient throughout day and patient has not had N/V. except on 1- 2 occasions patient states has been positive N./V. all day. Continues to dry heave. Positive flatus, negative BM, 2/13 patient states feeling of nausea has decreased somewhat. Was able to tolerate sitting on ice chips overnight 2/14 patient states feels great up and ambulating around the hallway request diet be advanced. 2/15 patient complains of mild nausea overnight secondary to overeating putting, Jell-O, ice cream etc... states Dr. Collene Mares (GI) reinforced that she needed to consume a diabetic diet. Dr. Collene Mares gave her tips and recommended reading material. Spoke with patient concerning starting her on Prozac to help with mood stability regarding her bipolar disease, patient agreed to start and maintain  Consultants:  Dr Berniece Andreas (psychiatry)  Dr. Juanita Craver (GI)   Procedures:  Gastric emptying study 07/13/2013 Abnormal gastric emptying study for solid material. This finding  suggests underlying gastric paresis. Differential diagnostic  consideration is gastric outlet obstruction to solid material. Given  the history of diabetes mellitus, gastric paresis is felt to be the  most likely etiology for this abnormal finding.   CXR 07/10/2013  Chronic interstitial prominence/scarring in the left mid lung is  unchanged. No free intraperitoneal air is seen on the upright view.  The bowel gas pattern is nonobstructive. No significant stool burden  noted. Several pelvic phleboliths are seen. No definite urinary  tract stones. No acute osseous abnormality.     Antibiotics:    Discharge Exam: Filed Vitals:   07/13/13 2009  07/13/13 2123 07/14/13 0412 07/14/13 1336  BP: 102/73  108/42 99/66  Pulse: 70 67 76 79  Temp: 98.1 F (36.7 C)  98.4 F (36.9 C) 97.4 F (36.3 C)  TempSrc: Oral  Oral Oral  Resp: 18 18 18 18   Height:      Weight:   134.2 kg (295 lb 13.7 oz)   SpO2: 94% 96% 92% 96%    General: A./O. x4, NAD  Cardiovascular: Regular rhythm and rate, negative murmurs rubs, gallops  Respiratory: Diffuse wheezing  Abdomen: Soft nontender nondistended plus hypoactive bowel sound  Musculoskeletal: Negative pedal edema     Discharge Instructions     Medication List    ASK your doctor about these medications       albuterol (2.5 MG/3ML) 0.083% nebulizer solution  Commonly known as:  PROVENTIL  Take 6 mLs (5 mg total) by nebulization every 4 (four) hours as needed for wheezing or shortness of breath.     albuterol 108 (90 BASE) MCG/ACT inhaler  Commonly known as:  PROVENTIL HFA;VENTOLIN HFA  Inhale 2 puffs into the lungs every 6 (six) hours as needed for wheezing.     carvedilol 25 MG tablet  Commonly known as:  COREG  Take 1 tablet (25 mg total) by mouth 2 (two) times daily with a meal.     clopidogrel 75 MG tablet  Commonly known as:  PLAVIX  Take  75 mg by mouth daily.     diazepam 10 MG tablet  Commonly known as:  VALIUM  Take 10 mg by mouth every 8 (eight) hours as needed for anxiety.     fluticasone 50 MCG/ACT nasal spray  Commonly known as:  FLONASE  Place 2 sprays into the nose daily.     furosemide 40 MG tablet  Commonly known as:  LASIX  Take 1 tablet (40 mg total) by mouth 3 (three) times daily.     HUMALOG 100 UNIT/ML injection  Generic drug:  insulin lispro  Inject 15-20 Units into the skin 3 (three) times daily before meals. Inject 15-20 Units subcutaneously 3 (three) times daily before meals.     insulin glargine 100 UNIT/ML injection  Commonly known as:  LANTUS  Inject 55 Units into the skin at bedtime.     ipratropium-albuterol 0.5-2.5 (3) MG/3ML Soln   Commonly known as:  DUONEB  Take 3 mLs by nebulization every 6 (six) hours as needed (wheezing).     lisinopril 20 MG tablet  Commonly known as:  PRINIVIL,ZESTRIL  Take 20 mg by mouth daily.     metolazone 5 MG tablet  Commonly known as:  ZAROXOLYN  Take 1 tablet (5 mg total) by mouth daily.     nitroGLYCERIN 0.4 MG SL tablet  Commonly known as:  NITROSTAT  Place 1 tablet (0.4 mg total) under the tongue every 5 (five) minutes as needed for chest pain.     omeprazole 40 MG capsule  Commonly known as:  PRILOSEC  Take 40 mg by mouth 2 (two) times daily.     potassium chloride 10 MEQ tablet  Commonly known as:  K-DUR  Take 10 mEq by mouth 2 (two) times daily. Take 1 tablet (10 mEq total) by mouth 2 (two) times daily.     pravastatin 40 MG tablet  Commonly known as:  PRAVACHOL  Take 40 mg by mouth daily.     Rivaroxaban 20 MG Tabs tablet  Commonly known as:  XARELTO  Take 1 tablet (20 mg total) by mouth daily with supper.     tiZANidine 4 MG tablet  Commonly known as:  ZANAFLEX  Take 4 mg by mouth every 6 (six) hours as needed for muscle spasms.     traZODone 100 MG tablet  Commonly known as:  DESYREL  Take 100 mg by mouth at bedtime.       Allergies  Allergen Reactions  . Nsaids Other (See Comments)    "I do not take NSAIDs d/t interfering with other meds"  . Metolazone Nausea And Vomiting    Side effect.      The results of significant diagnostics from this hospitalization (including imaging, microbiology, ancillary and laboratory) are listed below for reference.    Significant Diagnostic Studies: Dg Chest 2 View  06/29/2013   CLINICAL DATA:  Shortness of breath, cough.  EXAM: CHEST  2 VIEW  COMPARISON:  06/24/2013  FINDINGS: Heart is normal size. Diffuse interstitial prominence throughout the lungs, stable compatible with chronic interstitial lung disease. Scarring in the left upper lobe. Small bilateral pleural effusions. No acute bony abnormality.  IMPRESSION:  Chronic interstitial changes, stable.  Small bilateral pleural effusions.   Electronically Signed   By: Rolm Baptise M.D.   On: 06/29/2013 15:10   Dg Chest 2 View  06/24/2013   CLINICAL DATA:  Shortness of breath.  EXAM: CHEST  2 VIEW  COMPARISON:  11/27/2012  FINDINGS: Lungs are adequately inflated  with stable scarring over the left mid to upper lung. There is no focal airspace consolidation or effusion. There is borderline cardiomegaly. Remainder the exam is unchanged.  IMPRESSION: No active cardiopulmonary disease.   Electronically Signed   By: Marin Olp M.D.   On: 06/24/2013 18:20   Nm Gastric Emptying  07/13/2013   CLINICAL DATA:  Refractory nausea and vomiting; diabetes mellitus  EXAM: NUCLEAR MEDICINE GASTRIC EMPTYING SCAN  Views:  LAO stomach  Radionuclide: Technetium 30m sulfur colloid in egg  Dose:  2.2 mCi  Route of administration: Oral  COMPARISON:  None.  FINDINGS: Images and count statistics were obtained over the stomach following oral consumption of radiolabeled solid material.  After 1 hr, there was no emptying from the stomach. After 2 hr, 47% of solid material had emptied from the stomach. Normally, greater than 70% of solid material empties from the stomach after 2 hr. This study is regarded as abnormal.  IMPRESSION: Abnormal gastric emptying study for solid material. This finding suggests underlying gastric paresis. Differential diagnostic consideration is gastric outlet obstruction to solid material. Given the history of diabetes mellitus, gastric paresis is felt to be the most likely etiology for this abnormal finding.   Electronically Signed   By: Lowella Grip M.D.   On: 07/13/2013 15:39   Dg Abd 2 Views  07/10/2013   CLINICAL DATA:  Abdominal pain, nausea, vomiting  EXAM: ABDOMEN - 2 VIEW  COMPARISON:  Chest radiograph 06/29/2013 CT chest 01/23/2013  FINDINGS: Chronic interstitial prominence/scarring in the left mid lung is unchanged. No free intraperitoneal air is seen on  the upright view. The bowel gas pattern is nonobstructive. No significant stool burden noted. Several pelvic phleboliths are seen. No definite urinary tract stones. No acute osseous abnormality.  IMPRESSION: Nonobstructive bowel gas pattern.   Electronically Signed   By: Curlene Dolphin M.D.   On: 07/10/2013 10:53    Microbiology: No results found for this or any previous visit (from the past 240 hour(s)).   Labs: Basic Metabolic Panel:  Recent Labs Lab 07/11/13 0535 07/12/13 0535 07/13/13 0440 07/13/13 1245 07/14/13 0625  NA 133* 134* 132* 134* 131*  K 4.3 3.7 3.7 3.6* 3.6*  CL 91* 92* 90* 92* 90*  CO2 33* 29 30 30 28   GLUCOSE 242* 142* 110* 90 110*  BUN 37* 21 19 21  24*  CREATININE 1.23* 1.08 1.07 1.15* 1.26*  CALCIUM 8.6 8.6 9.1 8.9 8.6  MG  --  2.5 2.3 2.3 2.2   Liver Function Tests:  Recent Labs Lab 07/10/13 0810 07/12/13 0535 07/13/13 0440 07/13/13 1245 07/14/13 0625  AST 22 18 16 16 16   ALT 20 19 17 16 15   ALKPHOS 86 62 61 59 57  BILITOT 0.2* 0.3 0.3 0.3 0.3  PROT 7.6 7.5 7.9 7.6 7.0  ALBUMIN 3.3* 3.2* 3.4* 3.4* 3.1*    Recent Labs Lab 07/10/13 0810  LIPASE 25  AMYLASE 19   No results found for this basename: AMMONIA,  in the last 168 hours CBC:  Recent Labs Lab 07/10/13 0310 07/12/13 0535 07/13/13 0440 07/13/13 1245 07/14/13 0625  WBC 10.2 8.7 8.9 9.8 9.7  NEUTROABS 6.2 4.5 4.4 4.9 5.0  HGB 12.3 12.2 13.5 12.6 12.1  HCT 38.0 38.3 41.2 39.4 37.4  MCV 79.2 81.1 79.8 80.9 79.6  PLT 334 262 307 315 266   Cardiac Enzymes: No results found for this basename: CKTOTAL, CKMB, CKMBINDEX, TROPONINI,  in the last 168 hours BNP: BNP (last 3 results)  Recent Labs  06/24/13 1838 06/29/13 1430 07/01/13 0235  PROBNP 256.4* 770.4* 1194.0*   CBG:  Recent Labs Lab 07/12/13 2223 07/13/13 0407 07/13/13 0728 07/13/13 1128 07/13/13 1627  GLUCAP 116* 123* 97 84 112*       Signed:  Dia Crawford, MD [facility] [phone]  pager

## 2013-07-14 NOTE — Progress Notes (Signed)
Unassisgned patient Subjective: Since I last evaluated the patient, she seems to be doing very well. Her nausea and vomiting has completely resolved, She is very excited about this.   Objective: Vital signs in last 24 hours: Temp:  [97.4 F (36.3 C)-98.4 F (36.9 C)] 97.4 F (36.3 C) (02/14 1336) Pulse Rate:  [67-79] 79 (02/14 1336) Resp:  [18] 18 (02/14 1336) BP: (99-108)/(42-73) 99/66 mmHg (02/14 1336) SpO2:  [92 %-96 %] 96 % (02/14 1336) Weight:  [134.2 kg (295 lb 13.7 oz)] 134.2 kg (295 lb 13.7 oz) (02/14 0412) Last BM Date: 07/09/13  Intake/Output from previous day: 02/13 0701 - 02/14 0700 In: 1100 [I.V.:1100] Out: 600 [Urine:600] Intake/Output this shift: Total I/O In: 120 [P.O.:120] Out: 600 [Urine:600]  General appearance: alert, cooperative, appears stated age, no distress and morbidly obese Resp: clear to auscultation bilaterally Cardio: regular rate and rhythm, S1, S2 normal, no murmur, click, rub or gallop GI: soft, non-tender; bowel sounds normal; no masses,  no organomegaly Extremities: extremities normal, atraumatic, no cyanosis or edema  Lab Results:  Recent Labs  07/13/13 0440 07/13/13 1245 07/14/13 0625  WBC 8.9 9.8 9.7  HGB 13.5 12.6 12.1  HCT 41.2 39.4 37.4  PLT 307 315 266   BMET  Recent Labs  07/13/13 0440 07/13/13 1245 07/14/13 0625  NA 132* 134* 131*  K 3.7 3.6* 3.6*  CL 90* 92* 90*  CO2 30 30 28   GLUCOSE 110* 90 110*  BUN 19 21 24*  CREATININE 1.07 1.15* 1.26*  CALCIUM 9.1 8.9 8.6   LFT  Recent Labs  07/14/13 0625  PROT 7.0  ALBUMIN 3.1*  AST 16  ALT 15  ALKPHOS 57  BILITOT 0.3   Studies/Results: Nm Gastric Emptying  07/13/2013   CLINICAL DATA:  Refractory nausea and vomiting; diabetes mellitus  EXAM: NUCLEAR MEDICINE GASTRIC EMPTYING SCAN  Views:  LAO stomach  Radionuclide: Technetium 64m sulfur colloid in egg  Dose:  2.2 mCi  Route of administration: Oral  COMPARISON:  None.  FINDINGS: Images and count statistics  were obtained over the stomach following oral consumption of radiolabeled solid material.  After 1 hr, there was no emptying from the stomach. After 2 hr, 47% of solid material had emptied from the stomach. Normally, greater than 70% of solid material empties from the stomach after 2 hr. This study is regarded as abnormal.  IMPRESSION: Abnormal gastric emptying study for solid material. This finding suggests underlying gastric paresis. Differential diagnostic consideration is gastric outlet obstruction to solid material. Given the history of diabetes mellitus, gastric paresis is felt to be the most likely etiology for this abnormal finding.   Electronically Signed   By: Lowella Grip M.D.   On: 07/13/2013 15:39   Medications: I have reviewed the patient's current medications.  Assessment/Plan: Diabetic gastroparesis: continue prokinetcs. Will not need an EGD at this time. She has been advised to follow a low fat low sugar diet to help with her diabetes. Will sign off. Please call as needed.   LOS: 4 days   Grizelda Piscopo 07/14/2013, 1:54 PM

## 2013-07-15 LAB — COMPREHENSIVE METABOLIC PANEL
ALT: 16 U/L (ref 0–35)
AST: 16 U/L (ref 0–37)
Albumin: 3.3 g/dL — ABNORMAL LOW (ref 3.5–5.2)
Alkaline Phosphatase: 60 U/L (ref 39–117)
BUN: 34 mg/dL — ABNORMAL HIGH (ref 6–23)
CHLORIDE: 93 meq/L — AB (ref 96–112)
CO2: 29 mEq/L (ref 19–32)
Calcium: 8.5 mg/dL (ref 8.4–10.5)
Creatinine, Ser: 1.93 mg/dL — ABNORMAL HIGH (ref 0.50–1.10)
GFR calc Af Amer: 33 mL/min — ABNORMAL LOW (ref >90)
GFR calc non Af Amer: 29 mL/min — ABNORMAL LOW (ref >90)
Glucose, Bld: 138 mg/dL — ABNORMAL HIGH (ref 70–99)
POTASSIUM: 3.7 meq/L (ref 3.7–5.3)
Sodium: 134 mEq/L — ABNORMAL LOW (ref 137–147)
Total Bilirubin: 0.3 mg/dL (ref 0.3–1.2)
Total Protein: 7.2 g/dL (ref 6.0–8.3)

## 2013-07-15 LAB — CBC WITH DIFFERENTIAL/PLATELET
Basophils Absolute: 0 10*3/uL (ref 0.0–0.1)
Basophils Relative: 0 % (ref 0–1)
Eosinophils Absolute: 0.1 10*3/uL (ref 0.0–0.7)
Eosinophils Relative: 1 % (ref 0–5)
HCT: 35.2 % — ABNORMAL LOW (ref 36.0–46.0)
HEMOGLOBIN: 11.5 g/dL — AB (ref 12.0–15.0)
LYMPHS ABS: 2.7 10*3/uL (ref 0.7–4.0)
Lymphocytes Relative: 29 % (ref 12–46)
MCH: 25.7 pg — AB (ref 26.0–34.0)
MCHC: 32.7 g/dL (ref 30.0–36.0)
MCV: 78.7 fL (ref 78.0–100.0)
Monocytes Absolute: 1.1 10*3/uL — ABNORMAL HIGH (ref 0.1–1.0)
Monocytes Relative: 11 % (ref 3–12)
NEUTROS PCT: 58 % (ref 43–77)
Neutro Abs: 5.4 10*3/uL (ref 1.7–7.7)
Platelets: 303 10*3/uL (ref 150–400)
RBC: 4.47 MIL/uL (ref 3.87–5.11)
RDW: 16.2 % — ABNORMAL HIGH (ref 11.5–15.5)
WBC: 9.3 10*3/uL (ref 4.0–10.5)

## 2013-07-15 LAB — MAGNESIUM: MAGNESIUM: 2.3 mg/dL (ref 1.5–2.5)

## 2013-07-15 MED ORDER — PROMETHAZINE HCL 25 MG/ML IJ SOLN
6.2500 mg | Freq: Once | INTRAMUSCULAR | Status: AC
  Administered 2013-07-15: 6.25 mg via INTRAVENOUS
  Filled 2013-07-15: qty 1

## 2013-07-15 MED ORDER — ONDANSETRON HCL 4 MG PO TABS: 4.0000 mg | ORAL_TABLET | Freq: Four times a day (QID) | ORAL | Status: DC | PRN

## 2013-07-15 MED ORDER — PROMETHAZINE HCL 25 MG/ML IJ SOLN: 12.5000 mg | Freq: Once | INTRAMUSCULAR | Status: DC

## 2013-07-15 MED ORDER — METOCLOPRAMIDE HCL 10 MG PO TABS
10.0000 mg | ORAL_TABLET | Freq: Three times a day (TID) | ORAL | Status: DC
  Administered 2013-07-15 – 2013-07-16 (×4): 10 mg via ORAL
  Filled 2013-07-15 (×8): qty 1

## 2013-07-15 MED ORDER — FLUOXETINE HCL 20 MG PO CAPS
20.0000 mg | ORAL_CAPSULE | Freq: Every day | ORAL | Status: DC
  Administered 2013-07-15 – 2013-07-16 (×2): 20 mg via ORAL
  Filled 2013-07-15 (×3): qty 1

## 2013-07-15 NOTE — Progress Notes (Signed)
Pharmacy: Brief Xarelto note  53 yo female on chronic xarelto for history of PE.   ARF noted Scr is trending up 1.07 -- > 1.15 --> 1.26 --> 1.93 with current normalized CrCl ~ 45.    CBC okay today   Recommendations for Hx of DVT/PE are to hold Xarelto if CrCl decreases to < 30 ml/min.     Plan  1.) Continue Xarelto 20 mg daily 2.) BMET in AM, monitor ARF 3.) Pharmacy will continue to monitor   Abdoulie Tierce, Gaye Alken PharmD Pager #: [phone] 9:59 AM 07/15/2013

## 2013-07-15 NOTE — Progress Notes (Signed)
Patient does not wish to go on CPAP at this time.  Patient stated that she might use CPAP later on tonight.  RT informed patient that when she was ready for CPAP that she could call her RN and RT would come back and place her on CPAP.

## 2013-07-15 NOTE — Progress Notes (Signed)
Pt continuing to have nausea even following PRN Reglan and Zofran. On call notified. Phenergan ordered and given. Will continue to monitor. Laurel Harnden, Bing Neighbors, RN

## 2013-07-15 NOTE — Progress Notes (Signed)
Pt experiencing some nausea overnight. Some heaving, but no emesis. PRN Reglan and Zofran given with some relief. Will continue to monitor. Jathen Sudano, Bing Neighbors, RN

## 2013-07-15 NOTE — Progress Notes (Addendum)
07/15/2013 1520 NCM spoke to pt and states she has Medicaid. She does not need assistance with her meds. She goes to Dr. Karie Chimera, PCP. Spoke to Dr Sherral Hammers and will evaluate pt's need for [facility] at dc. No orders for [facility] at this time.   Jonnie Finner RN CCM Case Mgmt phone [phone]

## 2013-07-16 DIAGNOSIS — K3184 Gastroparesis: Secondary | ICD-10-CM

## 2013-07-16 DIAGNOSIS — Z7901 Long term (current) use of anticoagulants: Secondary | ICD-10-CM

## 2013-07-16 DIAGNOSIS — E1149 Type 2 diabetes mellitus with other diabetic neurological complication: Secondary | ICD-10-CM

## 2013-07-16 LAB — COMPREHENSIVE METABOLIC PANEL
ALK PHOS: 60 U/L (ref 39–117)
ALT: 17 U/L (ref 0–35)
AST: 19 U/L (ref 0–37)
Albumin: 3.1 g/dL — ABNORMAL LOW (ref 3.5–5.2)
BILIRUBIN TOTAL: 0.3 mg/dL (ref 0.3–1.2)
BUN: 20 mg/dL (ref 6–23)
CALCIUM: 8.6 mg/dL (ref 8.4–10.5)
CO2: 29 meq/L (ref 19–32)
Chloride: 97 mEq/L (ref 96–112)
Creatinine, Ser: 1.22 mg/dL — ABNORMAL HIGH (ref 0.50–1.10)
GFR calc non Af Amer: 50 mL/min — ABNORMAL LOW (ref >90)
GFR, EST AFRICAN AMERICAN: 58 mL/min — AB (ref >90)
Glucose, Bld: 163 mg/dL — ABNORMAL HIGH (ref 70–99)
POTASSIUM: 3.8 meq/L (ref 3.7–5.3)
Sodium: 136 mEq/L — ABNORMAL LOW (ref 137–147)
Total Protein: 7.2 g/dL (ref 6.0–8.3)

## 2013-07-16 LAB — CBC WITH DIFFERENTIAL/PLATELET
BASOS ABS: 0 10*3/uL (ref 0.0–0.1)
BASOS PCT: 1 % (ref 0–1)
Eosinophils Absolute: 0.2 10*3/uL (ref 0.0–0.7)
Eosinophils Relative: 3 % (ref 0–5)
HCT: 33.7 % — ABNORMAL LOW (ref 36.0–46.0)
Hemoglobin: 10.7 g/dL — ABNORMAL LOW (ref 12.0–15.0)
Lymphocytes Relative: 42 % (ref 12–46)
Lymphs Abs: 3 10*3/uL (ref 0.7–4.0)
MCH: 25.4 pg — ABNORMAL LOW (ref 26.0–34.0)
MCHC: 31.8 g/dL (ref 30.0–36.0)
MCV: 80 fL (ref 78.0–100.0)
Monocytes Absolute: 0.7 10*3/uL (ref 0.1–1.0)
Monocytes Relative: 10 % (ref 3–12)
NEUTROS ABS: 3.2 10*3/uL (ref 1.7–7.7)
Neutrophils Relative %: 45 % (ref 43–77)
Platelets: 328 10*3/uL (ref 150–400)
RBC: 4.21 MIL/uL (ref 3.87–5.11)
RDW: 16.4 % — AB (ref 11.5–15.5)
WBC: 7.2 10*3/uL (ref 4.0–10.5)

## 2013-07-16 LAB — MAGNESIUM: Magnesium: 2.2 mg/dL (ref 1.5–2.5)

## 2013-07-16 MED ORDER — INSULIN ASPART 100 UNIT/ML ~~LOC~~ SOLN: 8.0000 [IU] | Freq: Three times a day (TID) | SUBCUTANEOUS | Status: DC

## 2013-07-16 MED ORDER — FUROSEMIDE 40 MG PO TABS: 40.0000 mg | ORAL_TABLET | Freq: Every day | ORAL | Status: DC | PRN

## 2013-07-16 MED ORDER — FLUOXETINE HCL 20 MG PO CAPS: 20.0000 mg | ORAL_CAPSULE | Freq: Every day | ORAL | Status: DC

## 2013-07-16 MED ORDER — TIZANIDINE HCL 4 MG PO TABS: 2.0000 mg | ORAL_TABLET | Freq: Four times a day (QID) | ORAL | Status: DC | PRN

## 2013-07-16 MED ORDER — ONDANSETRON HCL 4 MG PO TABS: 4.0000 mg | ORAL_TABLET | Freq: Four times a day (QID) | ORAL | Status: DC | PRN

## 2013-07-16 MED ORDER — DIAZEPAM 10 MG PO TABS: 5.0000 mg | ORAL_TABLET | Freq: Three times a day (TID) | ORAL | Status: DC | PRN

## 2013-07-16 MED ORDER — METOCLOPRAMIDE HCL 10 MG PO TABS: 10.0000 mg | ORAL_TABLET | Freq: Three times a day (TID) | ORAL | Status: DC

## 2013-07-16 NOTE — Discharge Summary (Signed)
Physician Discharge Summary  [name] [id number] DOB: 03/24/1961 DOA: 07/10/2013  PCP: Philis Fendt, MD  Admit date: 07/10/2013 Discharge date: 07/16/2013  Time spent: 40 minutes  Recommendations for Outpatient Follow-up:  Nausea and vomiting/? Acute GE  - Continued Refractory N/V with Max antiemetics and PPIs.  -Unable to tolerate any diet. Counseled most likely secondary to diabetic gastroparesis. Spoke with Dr. Collene Mares from [place], and she would like first to conduct a gastric emptying study prior to considering EGD. Addendum; agrees with diagnosis of DM gastroparesis -Discharge on Reglan 10mg  Qac, QHS  -Discharge on Zofran 4mg  PRN   -2/15 Tolerated dysphagia 3 diet well ready for D/C   Acute renal failure  - Creatinine went up from 1.04 on 2/5 to 2.04 on 2/10. Likely secondary to dehydration/prerenal in the context of acute GE, diuretics and ACE inhibitors.  - 2/16 creatinine=1.22; Ready for D/C  -Do not restart lisinopril until patient follows up with PCP    Chronic combined CHF  - Compensated. Fluid management as above. Monitor closely  -ProBNP 07/01/2013= 1194  -2/10 weight= 137.4 kg and, Current weight= 135.8 kg standing -Patient needs to weigh herself today when she walks in her door at home. This will be considered her dry weight.    Sliding scale Lasix: Weigh yourself when you get home, then Daily in the Morning. Your dry weight will be what your scale says on the day you return home.(here is    lbs.).   If you gain more than 3 pounds from dry weight: Start the Lasix dosing to 40 mg in the morning  until weight returns to baseline dry weight.  If weight gain is greater than 5 pounds in 2 days: Increased to Lasix 40 mg twice a day and contact PCP office for further assistance if weight does not go down the next day.  If the weight goes down more than 3 pounds from dry weight: Hold Lasix until it returns to baseline dry weight   History of pulmonary  embolism  - Continue Xarelto 20 mg daily. PCP needs to monitor your renal function closely.  -DC Plavix secondary to increased risk of bleeding when combined with Xarelto    Hypertension  - Within ADA guidelines  - Continue Coreg 25 mg BID    History of CAD  - Asymptomatic. (Plavix. Held secondary to increased bleeding risk when combined with Xarelto) -Discharge patient only on Xarelto; needs to discuss with her cardiologist if she needs to restart Plavix.  Type II DM, uncontrolled  - Continue diabetic diet   -Hemoglobin A1c 07/01/2013= 8.9  -Reviewed again the need to bring her diabetes under control, emphasized the peripheral neuropathy and gastroparesis showed that she had your reversible damage to the nerves in her feet and stomach. Again counseled that these SSx would continue to worsen if she did not control -Discharged on Lantus 55 units, NovoLog 8 units QAC, and NovoLog resistant SSI  -Followup with PCP  Bipolar disease  -Psychiatry recommendations are to wait for patient to be seen outpatient.  -Spoke with patient on starting Prozac 20 mg, and she agreed to start and stay on medication -Patient verified that CSW Estill Bamberg had provided material for [employer] and that she would follow through    OSA  -Will require Pulmonary Consult for Sleep study; Update CPAP settings     Discharge Diagnoses:  Principal Problem:   Intractable nausea and vomiting Active Problems:   COPD exacerbation   Chronic anticoagulation  Coronary artery disease   Chronic pulmonary embolism   Tobacco abuse   Bipolar disorder   Essential hypertension   Insulin dependent diabetes mellitus   Acute renal failure   Chronic diastolic congestive heart failure   Discharge Condition: Stable  Diet recommendation: low fat low sugar diet    Filed Weights   07/14/13 0412 07/15/13 0444 07/16/13 0425  Weight: 134.2 kg (295 lb 13.7 oz) 134 kg (295 lb 6.7 oz) 135.8 kg (299 lb 6.2 oz)     History of present illness:  Tamara Silva is a 53 y.o.BF PMHx chronic diastolic dysfunction, pulmonary embolism, diabetes mellitus, hypertension, COPD, coronary artery disease, morbid obesity, obstructive sleep apnea.  The patient is coming from home.  The patient presented with complaints of nausea and vomiting .she mentions that she was given one dose of metolazone in the hospital and then discharged on the same home.after taking the medication at home she started having episodes of nausea and vomiting.she had nonbloody with this vomiting x5 last 24 hours associated with acid reflux.she denies any fever, chills, headache, cough, chest pain, palpitation, shortness of breath, orthopnea, PND, diarrhea, constipation, active bleeding, burning urination, dizziness, pedal edema, focal neurological deficit.  She continues to take the medication despite her nausea.she has poor appetite. She has lost 10 pounds since discharge 2/11 continues to complain of N./V. with clear fluids, does not know dry weight. States understands that she has poor control all diabetes. 2/12 although medical staff has observed patient throughout day and patient has not had N/V. except on 1- 2 occasions patient states has been positive N./V. all day. Continues to dry heave. Positive flatus, negative BM, 2/13 patient states feeling of nausea has decreased somewhat. Was able to tolerate sitting on ice chips overnight 2/14 patient states feels great up and ambulating around the hallway request diet be advanced. 2/15 patient complains of mild nausea overnight secondary to overeating putting, Jell-O, ice cream etc... states Dr. Collene Mares (GI) reinforced that she needed to consume a diabetic diet. Dr. Collene Mares gave her tips and recommended reading material. Spoke with patient concerning starting her on Prozac to help with mood stability regarding her bipolar disease, patient agreed to start and maintain 2/16 no adverse events  overnight.  Consultants:  Dr Berniece Andreas (psychiatry)  Dr. Juanita Craver (GI)   Procedures:  Gastric emptying study 07/13/2013 Abnormal gastric emptying study for solid material. This finding  suggests underlying gastric paresis. Differential diagnostic  consideration is gastric outlet obstruction to solid material. Given  the history of diabetes mellitus, gastric paresis is felt to be the  most likely etiology for this abnormal finding.   CXR 07/10/2013  Chronic interstitial prominence/scarring in the left mid lung is  unchanged. No free intraperitoneal air is seen on the upright view.  The bowel gas pattern is nonobstructive. No significant stool burden  noted. Several pelvic phleboliths are seen. No definite urinary  tract stones. No acute osseous abnormality.     Antibiotics:    Discharge Exam: Filed Vitals:   07/15/13 0444 07/15/13 1317 07/15/13 2014 07/16/13 0425  BP: 104/56 117/64 98/49 118/59  Pulse: 72 67 68 66  Temp: 97.9 F (36.6 C) 97.3 F (36.3 C) 97.5 F (36.4 C) 97.1 F (36.2 C)  TempSrc: Oral Oral Oral Oral  Resp: 18 20 18 18   Height:      Weight: 134 kg (295 lb 6.7 oz)   135.8 kg (299 lb 6.2 oz)  SpO2: 93% 95% 96% 99%  General: A./O. x4, NAD  Cardiovascular: Regular rhythm and rate, negative murmurs rubs, gallops  Respiratory: Diffuse wheezing  Abdomen: Soft nontender nondistended plus hypoactive bowel sound  Musculoskeletal: Negative pedal edema     Discharge Instructions     Medication List    ASK your doctor about these medications       albuterol (2.5 MG/3ML) 0.083% nebulizer solution  Commonly known as:  PROVENTIL  Take 6 mLs (5 mg total) by nebulization every 4 (four) hours as needed for wheezing or shortness of breath.     albuterol 108 (90 BASE) MCG/ACT inhaler  Commonly known as:  PROVENTIL HFA;VENTOLIN HFA  Inhale 2 puffs into the lungs every 6 (six) hours as needed for wheezing.     carvedilol 25 MG tablet  Commonly  known as:  COREG  Take 1 tablet (25 mg total) by mouth 2 (two) times daily with a meal.     clopidogrel 75 MG tablet  Commonly known as:  PLAVIX  Take 75 mg by mouth daily.     diazepam 10 MG tablet  Commonly known as:  VALIUM  Take 10 mg by mouth every 8 (eight) hours as needed for anxiety.     fluticasone 50 MCG/ACT nasal spray  Commonly known as:  FLONASE  Place 2 sprays into the nose daily.     furosemide 40 MG tablet  Commonly known as:  LASIX  Take 1 tablet (40 mg total) by mouth 3 (three) times daily.     HUMALOG 100 UNIT/ML injection  Generic drug:  insulin lispro  Inject 15-20 Units into the skin 3 (three) times daily before meals. Inject 15-20 Units subcutaneously 3 (three) times daily before meals.     insulin glargine 100 UNIT/ML injection  Commonly known as:  LANTUS  Inject 55 Units into the skin at bedtime.     ipratropium-albuterol 0.5-2.5 (3) MG/3ML Soln  Commonly known as:  DUONEB  Take 3 mLs by nebulization every 6 (six) hours as needed (wheezing).     lisinopril 20 MG tablet  Commonly known as:  PRINIVIL,ZESTRIL  Take 20 mg by mouth daily.     metolazone 5 MG tablet  Commonly known as:  ZAROXOLYN  Take 1 tablet (5 mg total) by mouth daily.     nitroGLYCERIN 0.4 MG SL tablet  Commonly known as:  NITROSTAT  Place 1 tablet (0.4 mg total) under the tongue every 5 (five) minutes as needed for chest pain.     omeprazole 40 MG capsule  Commonly known as:  PRILOSEC  Take 40 mg by mouth 2 (two) times daily.     potassium chloride 10 MEQ tablet  Commonly known as:  K-DUR  Take 10 mEq by mouth 2 (two) times daily. Take 1 tablet (10 mEq total) by mouth 2 (two) times daily.     pravastatin 40 MG tablet  Commonly known as:  PRAVACHOL  Take 40 mg by mouth daily.     Rivaroxaban 20 MG Tabs tablet  Commonly known as:  XARELTO  Take 1 tablet (20 mg total) by mouth daily with supper.     tiZANidine 4 MG tablet  Commonly known as:  ZANAFLEX  Take 4 mg by  mouth every 6 (six) hours as needed for muscle spasms.     traZODone 100 MG tablet  Commonly known as:  DESYREL  Take 100 mg by mouth at bedtime.       Allergies  Allergen Reactions  . Nsaids Other (See Comments)    "  I do not take NSAIDs d/t interfering with other meds"  . Metolazone Nausea And Vomiting    Side effect.      The results of significant diagnostics from this hospitalization (including imaging, microbiology, ancillary and laboratory) are listed below for reference.    Significant Diagnostic Studies: Dg Chest 2 View  06/29/2013   CLINICAL DATA:  Shortness of breath, cough.  EXAM: CHEST  2 VIEW  COMPARISON:  06/24/2013  FINDINGS: Heart is normal size. Diffuse interstitial prominence throughout the lungs, stable compatible with chronic interstitial lung disease. Scarring in the left upper lobe. Small bilateral pleural effusions. No acute bony abnormality.  IMPRESSION: Chronic interstitial changes, stable.  Small bilateral pleural effusions.   Electronically Signed   By: Rolm Baptise M.D.   On: 06/29/2013 15:10   Dg Chest 2 View  06/24/2013   CLINICAL DATA:  Shortness of breath.  EXAM: CHEST  2 VIEW  COMPARISON:  11/27/2012  FINDINGS: Lungs are adequately inflated with stable scarring over the left mid to upper lung. There is no focal airspace consolidation or effusion. There is borderline cardiomegaly. Remainder the exam is unchanged.  IMPRESSION: No active cardiopulmonary disease.   Electronically Signed   By: Marin Olp M.D.   On: 06/24/2013 18:20   Nm Gastric Emptying  07/13/2013   CLINICAL DATA:  Refractory nausea and vomiting; diabetes mellitus  EXAM: NUCLEAR MEDICINE GASTRIC EMPTYING SCAN  Views:  LAO stomach  Radionuclide: Technetium 29m sulfur colloid in egg  Dose:  2.2 mCi  Route of administration: Oral  COMPARISON:  None.  FINDINGS: Images and count statistics were obtained over the stomach following oral consumption of radiolabeled solid material.  After 1 hr,  there was no emptying from the stomach. After 2 hr, 47% of solid material had emptied from the stomach. Normally, greater than 70% of solid material empties from the stomach after 2 hr. This study is regarded as abnormal.  IMPRESSION: Abnormal gastric emptying study for solid material. This finding suggests underlying gastric paresis. Differential diagnostic consideration is gastric outlet obstruction to solid material. Given the history of diabetes mellitus, gastric paresis is felt to be the most likely etiology for this abnormal finding.   Electronically Signed   By: Lowella Grip M.D.   On: 07/13/2013 15:39   Dg Abd 2 Views  07/10/2013   CLINICAL DATA:  Abdominal pain, nausea, vomiting  EXAM: ABDOMEN - 2 VIEW  COMPARISON:  Chest radiograph 06/29/2013 CT chest 01/23/2013  FINDINGS: Chronic interstitial prominence/scarring in the left mid lung is unchanged. No free intraperitoneal air is seen on the upright view. The bowel gas pattern is nonobstructive. No significant stool burden noted. Several pelvic phleboliths are seen. No definite urinary tract stones. No acute osseous abnormality.  IMPRESSION: Nonobstructive bowel gas pattern.   Electronically Signed   By: Curlene Dolphin M.D.   On: 07/10/2013 10:53    Microbiology: No results found for this or any previous visit (from the past 240 hour(s)).   Labs: Basic Metabolic Panel:  Recent Labs Lab 07/13/13 0440 07/13/13 1245 07/14/13 0625 07/15/13 0543 07/16/13 0605  NA 132* 134* 131* 134* 136*  K 3.7 3.6* 3.6* 3.7 3.8  CL 90* 92* 90* 93* 97  CO2 30 30 28 29 29   GLUCOSE 110* 90 110* 138* 163*  BUN 19 21 24* 34* 20  CREATININE 1.07 1.15* 1.26* 1.93* 1.22*  CALCIUM 9.1 8.9 8.6 8.5 8.6  MG 2.3 2.3 2.2 2.3 2.2   Liver Function Tests:  Recent Labs Lab 07/13/13 0440 07/13/13 1245 07/14/13 0625 07/15/13 0543 07/16/13 0605  AST 16 16 16 16 19   ALT 17 16 15 16 17   ALKPHOS 61 59 57 60 60  BILITOT 0.3 0.3 0.3 0.3 0.3  PROT 7.9 7.6 7.0  7.2 7.2  ALBUMIN 3.4* 3.4* 3.1* 3.3* 3.1*    Recent Labs Lab 07/10/13 0810  LIPASE 25  AMYLASE 19   No results found for this basename: AMMONIA,  in the last 168 hours CBC:  Recent Labs Lab 07/13/13 0440 07/13/13 1245 07/14/13 0625 07/15/13 0543 07/16/13 0605  WBC 8.9 9.8 9.7 9.3 7.2  NEUTROABS 4.4 4.9 5.0 5.4 3.2  HGB 13.5 12.6 12.1 11.5* 10.7*  HCT 41.2 39.4 37.4 35.2* 33.7*  MCV 79.8 80.9 79.6 78.7 80.0  PLT 307 315 266 303 328   Cardiac Enzymes: No results found for this basename: CKTOTAL, CKMB, CKMBINDEX, TROPONINI,  in the last 168 hours BNP: BNP (last 3 results)  Recent Labs  06/24/13 1838 06/29/13 1430 07/01/13 0235  PROBNP 256.4* 770.4* 1194.0*   CBG:  Recent Labs Lab 07/12/13 2223 07/13/13 0407 07/13/13 0728 07/13/13 1128 07/13/13 1627  GLUCAP 116* 123* 97 84 112*       Signed:  Dia Crawford, MD [facility] [phone] pager

## 2013-07-16 NOTE — Progress Notes (Signed)
Called to pt bedside to place cpap on pt.  Upon arrival, pt questioned whether or not she was being "recorded" while wearing cpap.  Pt was advised that cpap trials are recorded during an actual sleep study, but not with current cpap machine at bedside.  Despite encouragement and explanation of the benefits to wearing bedside cpap, pt refused again.  Pt was advised that RT is available all night should she change her mind.  RN notified.

## 2013-07-17 LAB — GLUCOSE, CAPILLARY
GLUCOSE-CAPILLARY: 120 mg/dL — AB (ref 70–99)
GLUCOSE-CAPILLARY: 134 mg/dL — AB (ref 70–99)
GLUCOSE-CAPILLARY: 79 mg/dL (ref 70–99)
GLUCOSE-CAPILLARY: 104 mg/dL — AB (ref 70–99)
Glucose-Capillary: 125 mg/dL — ABNORMAL HIGH (ref 70–99)
Glucose-Capillary: 137 mg/dL — ABNORMAL HIGH (ref 70–99)
Glucose-Capillary: 143 mg/dL — ABNORMAL HIGH (ref 70–99)
Glucose-Capillary: 160 mg/dL — ABNORMAL HIGH (ref 70–99)
Glucose-Capillary: 193 mg/dL — ABNORMAL HIGH (ref 70–99)
Glucose-Capillary: 228 mg/dL — ABNORMAL HIGH (ref 70–99)
Glucose-Capillary: 103 mg/dL — ABNORMAL HIGH (ref 70–99)
Glucose-Capillary: 114 mg/dL — ABNORMAL HIGH (ref 70–99)
Glucose-Capillary: 115 mg/dL — ABNORMAL HIGH (ref 70–99)
Glucose-Capillary: 205 mg/dL — ABNORMAL HIGH (ref 70–99)
Glucose-Capillary: 218 mg/dL — ABNORMAL HIGH (ref 70–99)
Glucose-Capillary: 81 mg/dL (ref 70–99)
Glucose-Capillary: 91 mg/dL (ref 70–99)

## 2013-07-23 ENCOUNTER — Other Ambulatory Visit: Payer: Self-pay

## 2013-07-23 DIAGNOSIS — Z1231 Encounter for screening mammogram for malignant neoplasm of breast: Secondary | ICD-10-CM

## 2013-07-27 ENCOUNTER — Ambulatory Visit: Payer: Self-pay

## 2013-07-28 ENCOUNTER — Emergency Department (HOSPITAL_COMMUNITY): Payer: Medicaid Other

## 2013-07-28 ENCOUNTER — Inpatient Hospital Stay (HOSPITAL_COMMUNITY)
Admission: EM | Admit: 2013-07-28 | Discharge: 2013-08-04 | DRG: 073 | Disposition: A | Discharge status: Home Health | Payer: Medicaid Other | Attending: Internal Medicine | Admitting: Internal Medicine

## 2013-07-28 ENCOUNTER — Encounter (HOSPITAL_COMMUNITY): Payer: Self-pay | Admitting: Emergency Medicine

## 2013-07-28 DIAGNOSIS — I2782 Chronic pulmonary embolism: Secondary | ICD-10-CM

## 2013-07-28 DIAGNOSIS — F319 Bipolar disorder, unspecified: Secondary | ICD-10-CM | POA: Diagnosis present

## 2013-07-28 DIAGNOSIS — Z800 Family history of malignant neoplasm of digestive organs: Secondary | ICD-10-CM

## 2013-07-28 DIAGNOSIS — D126 Benign neoplasm of colon, unspecified: Secondary | ICD-10-CM | POA: Diagnosis present

## 2013-07-28 DIAGNOSIS — I2589 Other forms of chronic ischemic heart disease: Secondary | ICD-10-CM | POA: Diagnosis present

## 2013-07-28 DIAGNOSIS — R0789 Other chest pain: Secondary | ICD-10-CM | POA: Diagnosis present

## 2013-07-28 DIAGNOSIS — D649 Anemia, unspecified: Secondary | ICD-10-CM | POA: Diagnosis present

## 2013-07-28 DIAGNOSIS — E662 Morbid (severe) obesity with alveolar hypoventilation: Secondary | ICD-10-CM | POA: Diagnosis present

## 2013-07-28 DIAGNOSIS — J441 Chronic obstructive pulmonary disease with (acute) exacerbation: Secondary | ICD-10-CM

## 2013-07-28 DIAGNOSIS — I5033 Acute on chronic diastolic (congestive) heart failure: Secondary | ICD-10-CM | POA: Diagnosis present

## 2013-07-28 DIAGNOSIS — R066 Hiccough: Secondary | ICD-10-CM | POA: Diagnosis not present

## 2013-07-28 DIAGNOSIS — I10 Essential (primary) hypertension: Secondary | ICD-10-CM

## 2013-07-28 DIAGNOSIS — I5032 Chronic diastolic (congestive) heart failure: Secondary | ICD-10-CM

## 2013-07-28 DIAGNOSIS — Z794 Long term (current) use of insulin: Secondary | ICD-10-CM

## 2013-07-28 DIAGNOSIS — E1142 Type 2 diabetes mellitus with diabetic polyneuropathy: Secondary | ICD-10-CM | POA: Diagnosis present

## 2013-07-28 DIAGNOSIS — K2990 Gastroduodenitis, unspecified, without bleeding: Secondary | ICD-10-CM

## 2013-07-28 DIAGNOSIS — K3184 Gastroparesis: Secondary | ICD-10-CM

## 2013-07-28 DIAGNOSIS — I5042 Chronic combined systolic (congestive) and diastolic (congestive) heart failure: Secondary | ICD-10-CM | POA: Diagnosis present

## 2013-07-28 DIAGNOSIS — R112 Nausea with vomiting, unspecified: Secondary | ICD-10-CM

## 2013-07-28 DIAGNOSIS — I2510 Atherosclerotic heart disease of native coronary artery without angina pectoris: Secondary | ICD-10-CM | POA: Diagnosis present

## 2013-07-28 DIAGNOSIS — J9601 Acute respiratory failure with hypoxia: Secondary | ICD-10-CM

## 2013-07-28 DIAGNOSIS — E119 Type 2 diabetes mellitus without complications: Secondary | ICD-10-CM

## 2013-07-28 DIAGNOSIS — K2970 Gastritis, unspecified, without bleeding: Secondary | ICD-10-CM | POA: Diagnosis present

## 2013-07-28 DIAGNOSIS — Z9861 Coronary angioplasty status: Secondary | ICD-10-CM

## 2013-07-28 DIAGNOSIS — J45901 Unspecified asthma with (acute) exacerbation: Secondary | ICD-10-CM

## 2013-07-28 DIAGNOSIS — E785 Hyperlipidemia, unspecified: Secondary | ICD-10-CM | POA: Diagnosis present

## 2013-07-28 DIAGNOSIS — K219 Gastro-esophageal reflux disease without esophagitis: Secondary | ICD-10-CM | POA: Diagnosis present

## 2013-07-28 DIAGNOSIS — F4310 Post-traumatic stress disorder, unspecified: Secondary | ICD-10-CM | POA: Diagnosis present

## 2013-07-28 DIAGNOSIS — Z7901 Long term (current) use of anticoagulants: Secondary | ICD-10-CM

## 2013-07-28 DIAGNOSIS — K5900 Constipation, unspecified: Secondary | ICD-10-CM | POA: Diagnosis present

## 2013-07-28 DIAGNOSIS — Z6841 Body Mass Index (BMI) 40.0 and over, adult: Secondary | ICD-10-CM

## 2013-07-28 DIAGNOSIS — Z87891 Personal history of nicotine dependence: Secondary | ICD-10-CM

## 2013-07-28 DIAGNOSIS — G4733 Obstructive sleep apnea (adult) (pediatric): Secondary | ICD-10-CM | POA: Diagnosis present

## 2013-07-28 DIAGNOSIS — J449 Chronic obstructive pulmonary disease, unspecified: Secondary | ICD-10-CM

## 2013-07-28 DIAGNOSIS — J9620 Acute and chronic respiratory failure, unspecified whether with hypoxia or hypercapnia: Secondary | ICD-10-CM | POA: Diagnosis present

## 2013-07-28 DIAGNOSIS — I509 Heart failure, unspecified: Secondary | ICD-10-CM

## 2013-07-28 DIAGNOSIS — R0902 Hypoxemia: Secondary | ICD-10-CM

## 2013-07-28 DIAGNOSIS — Z823 Family history of stroke: Secondary | ICD-10-CM

## 2013-07-28 DIAGNOSIS — R1115 Cyclical vomiting syndrome unrelated to migraine: Secondary | ICD-10-CM

## 2013-07-28 DIAGNOSIS — E1149 Type 2 diabetes mellitus with other diabetic neurological complication: Principal | ICD-10-CM | POA: Diagnosis present

## 2013-07-28 LAB — URINALYSIS, ROUTINE W REFLEX MICROSCOPIC
Bilirubin Urine: NEGATIVE
Glucose, UA: NEGATIVE mg/dL
HGB URINE DIPSTICK: NEGATIVE
KETONES UR: NEGATIVE mg/dL
Leukocytes, UA: NEGATIVE
Nitrite: NEGATIVE
PROTEIN: NEGATIVE mg/dL
Specific Gravity, Urine: 1.017 (ref 1.005–1.030)
UROBILINOGEN UA: 1 mg/dL (ref 0.0–1.0)
pH: 7 (ref 5.0–8.0)

## 2013-07-28 LAB — CBC WITH DIFFERENTIAL/PLATELET
BASOS ABS: 0 10*3/uL (ref 0.0–0.1)
Basophils Relative: 0 % (ref 0–1)
Eosinophils Absolute: 0 10*3/uL (ref 0.0–0.7)
Eosinophils Relative: 0 % (ref 0–5)
HCT: 37.9 % (ref 36.0–46.0)
Hemoglobin: 12.3 g/dL (ref 12.0–15.0)
Lymphocytes Relative: 26 % (ref 12–46)
Lymphs Abs: 2.2 10*3/uL (ref 0.7–4.0)
MCH: 26.4 pg (ref 26.0–34.0)
MCHC: 32.5 g/dL (ref 30.0–36.0)
MCV: 81.3 fL (ref 78.0–100.0)
MONOS PCT: 5 % (ref 3–12)
Monocytes Absolute: 0.4 10*3/uL (ref 0.1–1.0)
Neutro Abs: 5.8 10*3/uL (ref 1.7–7.7)
Neutrophils Relative %: 69 % (ref 43–77)
PLATELETS: 325 10*3/uL (ref 150–400)
RBC: 4.66 MIL/uL (ref 3.87–5.11)
RDW: 17.2 % — ABNORMAL HIGH (ref 11.5–15.5)
WBC: 8.4 10*3/uL (ref 4.0–10.5)

## 2013-07-28 LAB — COMPREHENSIVE METABOLIC PANEL
ALBUMIN: 3.4 g/dL — AB (ref 3.5–5.2)
ALK PHOS: 63 U/L (ref 39–117)
ALT: 15 U/L (ref 0–35)
AST: 14 U/L (ref 0–37)
BUN: 11 mg/dL (ref 6–23)
CHLORIDE: 98 meq/L (ref 96–112)
CO2: 26 mEq/L (ref 19–32)
Calcium: 9 mg/dL (ref 8.4–10.5)
Creatinine, Ser: 0.88 mg/dL (ref 0.50–1.10)
GFR calc Af Amer: 85 mL/min — ABNORMAL LOW (ref >90)
GFR calc non Af Amer: 74 mL/min — ABNORMAL LOW (ref >90)
Glucose, Bld: 194 mg/dL — ABNORMAL HIGH (ref 70–99)
POTASSIUM: 4 meq/L (ref 3.7–5.3)
Sodium: 140 mEq/L (ref 137–147)
Total Protein: 7.8 g/dL (ref 6.0–8.3)

## 2013-07-28 LAB — TROPONIN I: Troponin I: <0.3 ng/mL (ref <0.30)

## 2013-07-28 LAB — POC URINE PREG, ED: Preg Test, Ur: NEGATIVE

## 2013-07-28 LAB — POC OCCULT BLOOD, ED: Fecal Occult Bld: NEGATIVE

## 2013-07-28 LAB — LIPASE, BLOOD: Lipase: 15 U/L (ref 11–59)

## 2013-07-28 MED ORDER — IOHEXOL 350 MG/ML SOLN
100.0000 mL | Freq: Once | INTRAVENOUS | Status: AC | PRN
  Administered 2013-07-28: 100 mL via INTRAVENOUS

## 2013-07-28 MED ORDER — IPRATROPIUM-ALBUTEROL 0.5-2.5 (3) MG/3ML IN SOLN
3.0000 mL | Freq: Once | RESPIRATORY_TRACT | Status: AC
  Administered 2013-07-28: 3 mL via RESPIRATORY_TRACT
  Filled 2013-07-28: qty 3

## 2013-07-28 MED ORDER — ONDANSETRON 8 MG PO TBDP
8.0000 mg | ORAL_TABLET | Freq: Once | ORAL | Status: DC
  Filled 2013-07-28: qty 1

## 2013-07-28 MED ORDER — ONDANSETRON 4 MG PO TBDP
4.0000 mg | ORAL_TABLET | Freq: Once | ORAL | Status: AC
  Administered 2013-07-28: 4 mg via ORAL
  Filled 2013-07-28: qty 1

## 2013-07-28 MED ORDER — ONDANSETRON HCL 4 MG/2ML IJ SOLN
4.0000 mg | Freq: Once | INTRAMUSCULAR | Status: AC
  Administered 2013-07-28: 4 mg via INTRAVENOUS
  Filled 2013-07-28: qty 2

## 2013-07-28 NOTE — H&P (Signed)
PCP:  Philis Fendt, MD    Chief Complaint:  Nausea and vomiting  HPI: Tamara Silva is a 53 y.o. female   has a past medical history of Diabetes mellitus without complication; PE (pulmonary embolism); Hypercholesterolemia; Hypertension; COPD (chronic obstructive pulmonary disease); Asthma; Coronary artery disease; CHF (congestive heart failure); Mental disorder; Shortness of breath; GERD (gastroesophageal reflux disease); Neuromuscular disorder; Ischemic cardiomyopathy; Bipolar disorder; and Post-traumatic stress syndrome.   Presented with  Patient was admitted 2/10-2/16 for emesis and was diagnosed with gastroparesis she was discharged on reglan and was supposed to follow up her PCP due to snow she was not able to go and run out of her reglan at that point hr nausea and vomiting resumed. IN ER she was noted to be hypoxic with oxygen saturation down to 89% on RA. Patient denies being on oxygen at home but states she needs it. Patient states she has had hypoxic events while hospitalized last week. She had xh of diastolic CHF, COPD and PE in May 2014 still on Xarelto. She denies any significant coughing but had some minor wheezing which is her baseline. Patient had a CTA done in ER which showed no acute PE but evidence of pulmonary artery narrowing likely from old PE.  Currently nausea has improved. Will admit for hypoxia. Patient denies any diarrhea just nausea and vomiting. Denies chest pain.   Review of Systems:    Pertinent positives include: nausea, vomiting, shortness of breath at rest. Constipation, small amount of blood on tissue after straining to have a BM  Constitutional:  No weight loss, night sweats, Fevers, chills, fatigue, weight loss  HEENT:  No headaches, Difficulty swallowing,Tooth/dental problems,Sore throat,  No sneezing, itching, ear ache, nasal congestion, post nasal drip,  Cardio-vascular:  No chest pain, Orthopnea, PND, anasarca, dizziness, palpitations.no Bilateral  lower extremity swelling  GI:  No heartburn, indigestion, abdominal pain,  diarrhea, change in bowel habits, loss of appetite, melena, blood in stool, hematemesis Resp:  no  No dyspnea on exertion, No excess mucus, no productive cough, No non-productive cough, No coughing up of blood.No change in color of mucus.No wheezing. Skin:  no rash or lesions. No jaundice GU:  no dysuria, change in color of urine, no urgency or frequency. No straining to urinate.  No flank pain.  Musculoskeletal:  No joint pain or no joint swelling. No decreased range of motion. No back pain.  Psych:  No change in mood or affect. No depression or anxiety. No memory loss.  Neuro: no localizing neurological complaints, no tingling, no weakness, no double vision, no gait abnormality, no slurred speech, no confusion  Otherwise ROS are negative except for above, 10 systems were reviewed  Past Medical History: Past Medical History  Diagnosis Date  . Diabetes mellitus without complication   . PE (pulmonary embolism)   . Hypercholesterolemia   . Hypertension   . COPD (chronic obstructive pulmonary disease)   . Asthma   . Coronary artery disease   . CHF (congestive heart failure)   . Mental disorder     BIPOLAR  . Shortness of breath   . GERD (gastroesophageal reflux disease)   . Neuromuscular disorder     DIABETIC NEUROPATHY  . Ischemic cardiomyopathy   . Bipolar disorder   . Post-traumatic stress syndrome    Past Surgical History  Procedure Laterality Date  . Coronary angioplasty with stent placement      CAD in 2006 x 2 and 2009 2 more- place din [facility]     Medications: Prior to Admission medications   Medication Sig Start Date End Date Taking? Authorizing Provider  albuterol (PROVENTIL HFA;VENTOLIN HFA) 108 (90 BASE) MCG/ACT inhaler Inhale 2 puffs into the lungs every 6 (six) hours as needed for wheezing. 06/29/13  Yes Kathalene Frames, MD  albuterol (PROVENTIL) (2.5 MG/3ML) 0.083%  nebulizer solution Take 6 mLs (5 mg total) by nebulization every 4 (four) hours as needed for wheezing or shortness of breath. 06/24/13  Yes Jennifer L Piepenbrink, PA-C  carvedilol (COREG) 25 MG tablet Take 1 tablet (25 mg total) by mouth 2 (two) times daily with a meal. 06/29/13  Yes Kathalene Frames, MD  diazepam (VALIUM) 10 MG tablet Take 0.5 tablets (5 mg total) by mouth every 8 (eight) hours as needed for anxiety. 07/16/13  Yes Allie Bossier, MD  FLUoxetine (PROZAC) 20 MG capsule Take 1 capsule (20 mg total) by mouth daily. 07/16/13  Yes Allie Bossier, MD  fluticasone [facility]) 50 MCG/ACT nasal spray Place 2 sprays into the nose every morning.    Yes Historical Provider, MD  furosemide (LASIX) 40 MG tablet Take 40-80 mg by mouth 3 (three) times daily. Take 2 tabs in the morning. 1 tab at noon. 2 tabs in the evening.   Yes Historical Provider, MD  insulin glargine (LANTUS) 100 UNIT/ML injection Inject 55 Units into the skin at bedtime. 11/28/12  Yes Nita Sells, MD  insulin lispro (HUMALOG) 100 UNIT/ML injection Inject 0-20 Units into the skin 3 (three) times daily before meals. SSI based on BG   Yes Historical Provider, MD  ipratropium-albuterol (DUONEB) 0.5-2.5 (3) MG/3ML SOLN Take 3 mLs by nebulization every 6 (six) hours as needed (wheezing). 06/29/13  Yes Kathalene Frames, MD  metoCLOPramide (REGLAN) 10 MG tablet Take 1 tablet (10 mg total) by mouth 4 (four) times daily -  before meals and at bedtime. 07/16/13  Yes Allie Bossier, MD  nitroGLYCERIN (NITROSTAT) 0.4 MG SL tablet Place 1 tablet (0.4 mg total) under the tongue every 5 (five) minutes as needed for chest pain. 02/08/13  Yes Lorretta Harp, MD  omeprazole (PRILOSEC) 40 MG capsule Take 40 mg by mouth 2 (two) times daily.   Yes Historical Provider, MD  ondansetron (ZOFRAN) 4 MG tablet Take 1 tablet (4 mg total) by mouth 4 (four) times daily as needed for nausea or vomiting. 07/16/13  Yes Allie Bossier, MD  potassium chloride (K-DUR) 10 MEQ  tablet Take 10 mEq by mouth 2 (two) times daily.  10/21/12 10/21/13 Yes Historical Provider, MD  pravastatin (PRAVACHOL) 40 MG tablet Take 40 mg by mouth every evening.    Yes Historical Provider, MD  Rivaroxaban (XARELTO) 20 MG TABS Take 1 tablet (20 mg total) by mouth daily with supper. 11/28/12  Yes Nita Sells, MD  tiZANidine (ZANAFLEX) 4 MG tablet Take 0.5 tablets (2 mg total) by mouth every 6 (six) hours as needed for muscle spasms. 07/16/13  Yes Allie Bossier, MD  traZODone (DESYREL) 100 MG tablet Take 100 mg by mouth at bedtime.   Yes Historical Provider, MD    Allergies:   Allergies  Allergen Reactions  . Nsaids Other (See Comments)    "I do not take NSAIDs d/t interfering with other meds"  . Metolazone Nausea And Vomiting    Social History:  Ambulatory  cane Lives at home with roomate   reports that she quit smoking about 8 months ago. Her smoking use included Cigarettes. She  smoked 0.00 packs per day. She has never used smokeless tobacco. She reports that she does not drink alcohol or use illicit drugs.   Family History: family history includes Cancer in her father; Stroke in her mother.    Physical Exam: Patient Vitals for the past 24 hrs:  BP Temp Temp src Pulse Resp SpO2  07/28/13 1730 140/71 mmHg 98.6 F (37 C) Oral 81 15 89 %  07/28/13 1601 146/71 mmHg 98 F (36.7 C) Oral 81 17 100 %  07/28/13 1558 - - - - - 94 %    1. General:  in No Acute distress 2. Psychological: lethargic but Oriented 3. Head/ENT:     Dry Mucous Membranes                          Head Non traumatic, neck supple                          Normal Dentition 4. SKIN:  decreased Skin turgor,  Skin clean Dry and intact no rash 5. Heart: Regular rate and rhythm no Murmur, Rub or gallop 6. Lungs: no wheezes or crackles  distant breath sounds 7. Abdomen: Soft, non-tender, Non distended obese 8. Lower extremities: no clubbing, cyanosis, or edema 9. Neurologically Grossly intact, moving all  4 extremities equally 10. MSK: Normal range of motion  body mass index is unknown because there is no weight on file.   Labs on Admission:   Recent Labs  07/28/13 1950  NA 140  K 4.0  CL 98  CO2 26  GLUCOSE 194*  BUN 11  CREATININE 0.88  CALCIUM 9.0    Recent Labs  07/28/13 1950  AST 14  ALT 15  ALKPHOS 63  BILITOT <0.2*  PROT 7.8  ALBUMIN 3.4*    Recent Labs  07/28/13 1950  LIPASE 15    Recent Labs  07/28/13 1907  WBC 8.4  NEUTROABS 5.8  HGB 12.3  HCT 37.9  MCV 81.3  PLT 325    Recent Labs  07/28/13 1950  TROPONINI <0.30   No results found for this basename: TSH, T4TOTAL, FREET3, T3FREE, THYROIDAB,  in the last 72 hours No results found for this basename: VITAMINB12, FOLATE, FERRITIN, TIBC, IRON, RETICCTPCT,  in the last 72 hours Lab Results  Component Value Date   HGBA1C 8.9* 07/01/2013    The CrCl is unknown because both a height and weight (above a minimum accepted value) are required for this calculation. ABG    Component Value Date/Time   HCO3 36.4* 07/10/2013 0316   TCO2 33.3 07/10/2013 0316   O2SAT 34.5 07/10/2013 0316     No results found for this basename: DDIMER     Other results:  I have pearsonaly reviewed this: ECG REPORT  Rate: 82  Rhythm: NSR with left ventricular hypertrophy ST&T Change: no ischemia   UA no evidence of UTI   Cultures:    Component Value Date/Time   SDES BLOOD RIGHT ARM 11/27/2012 0425   SDES BLOOD RIGHT ARM 11/27/2012 0425   SPECREQUEST BOTTLES DRAWN AEROBIC ONLY 5CC 11/27/2012 0425   SPECREQUEST BOTTLES DRAWN AEROBIC ONLY 5CC 11/27/2012 0425   CULT NO GROWTH 5 DAYS 11/27/2012 0425   CULT NO GROWTH 5 DAYS 11/27/2012 0425   REPTSTATUS 12/03/2012 FINAL 11/27/2012 0425   REPTSTATUS 12/03/2012 FINAL 11/27/2012 0425       Radiological Exams on Admission: Dg Chest 2 View  07/28/2013   CLINICAL DATA:  Chest pain  EXAM: CHEST  2 VIEW  COMPARISON:  06/29/2013  FINDINGS: Cardiomediastinal silhouette is  stable. Stable scarring in left upper lobe. Chronic interstitial prominence and chronic mild bronchitic changes. No acute infiltrate or pulmonary edema. Bony thorax is stable.  IMPRESSION: No acute infiltrate or pulmonary edema. Stable chronic interstitial prominence and mild bronchitic changes. Again noted scarring in left upper lobe.   Electronically Signed   By: Lahoma Crocker M.D.   On: 07/28/2013 17:26    Chart has been reviewed  Assessment/Plan  53 year old female with history of pulmonary embolism and systolic heart failure with EF of 40% here with hypoxia and nausea and vomiting likely secondary to gastroparesis off of her medications  Present on Admission:  . Nausea & vomiting  - most likely secondary to gastroparesis will resume Reglan that she have had great improvement but then passed. Will decrease dose Lasix as patient somewhat fluid down currently  . Essential hypertension - continue home medications  . COPD (chronic obstructive pulmonary disease) - currently appears to be stable patient as history of COPD and likely half oxygen requirement at baseline. Will ambulate and check oxygenation prior to discharge she would likely benefit from home O2  . Chronic diastolic congestive heart failure - decrease Lasix to 40 twice a day while patient has had recent nausea and vomiting poor by mouth intake. Her Lasix should be increased back to her home dose to avoid fluid overload  Gastroparesis - restart Reglan  . Hx of pulmonary embolus - continue xarelto and hypoxia could be secondary to history of PE with evidence of pulmonary artery narrowing . Intractable nausea and vomiting - likely secondary to poorly-controlled gastroparesis now improved  . Diabetes mellitus - sliding scale continue Lantus  . Hypoxia - multifactorial patient has history of COPD. As well as CHF which appears to be euvolemic. As well as history of PE CT scan showing evidence of pulmonary artery stenosis likely secondary to old  PE but no new findings. Patient likely benefit from home O2. Prior to discharge Will ambulate and check oxygen level    Prophylaxis: xarelto, Protonix  CODE STATUS: FULL CODE  Other plan as per orders.  I have spent a total of 55 min on this admission  Karin Griffith 07/28/2013, 10:53 PM

## 2013-07-28 NOTE — ED Notes (Addendum)
Per EMS: Pt reports nausea and emesis for two days. Pt states she is unable to keep PO fluids or food down. Pt reports "nausea pain" in her stomach. Pt reports a history of acid reflux. Pt reports that she has "stomach nerve that is damage" and was recently admitted for the same. Pt is A/O x4, in NAD, and vitals are WDL.

## 2013-07-28 NOTE — ED Notes (Signed)
Lab called stating blood sample for CMP clotted, needs to be redrawn. ED phlebotomist at bedside attempting blood draw. PA aware.

## 2013-07-28 NOTE — ED Notes (Signed)
Attempted IV insertion and blood draw, unable to obtain. IV team paged.

## 2013-07-28 NOTE — ED Provider Notes (Signed)
CSN: [id number]     Arrival date & time 07/28/13  1555 History   First MD Initiated Contact with Patient 07/28/13 1626     Chief Complaint  Patient presents with  . Emesis    HPI  Tamara Silva is a 53 y.o. female with a PMH of DM, PE, HLD, HTN, COPD, asthma, CAD s/p stent, CHF, bipolar disorder, GERD, ischemic cardiomyopathy, PTSD who presents to the ED for evaluation of emesis. History was provided by the patient. Patient states she has had nausea and vomiting for the past few days. She has vomited at least 10 times daily. Her last episodes upon arrival to the ED. She had one episode of emesis with a blood tinged streak but denies any gross hemoptysis. She denies any abdominal pain but reports severe nausea "pains in my stomach" and points to her upper middle abdomen and lower chest. She has had some constipation with a normal bowel movement today. She denies any diarrhea. She had one episode of a minimal amount of hematochezia. Denies any rectal bleeding/pain. Patient has tried Reglan and Zofran with temporary relief of her symptoms. She was admitted on 07/10/13 for emesis and discharged on 07/16/13. She saw Dr. Collene Mares (her GI specialist) during her stay and had a gastric emptying study. Emesis thought to be related to diabetic gastroparesis. Patient did not follow-up with anyone after discharge due to the winter weather. She denies any SOB, chest pain, cough. Patient previous tobacco user. No home O2 use. She has been taking her lasix and xarelto with no missed doses. No headache, lightheadedness, dizziness, rhinorrhea, sore throat. Has not been weighing her self but thinks she may have lost a few pounds. No fevers. No dysuria, vaginal discharge/bleeding.    Past Medical History  Diagnosis Date  . Diabetes mellitus without complication   . PE (pulmonary embolism)   . Hypercholesterolemia   . Hypertension   . COPD (chronic obstructive pulmonary disease)   . Asthma   . Coronary artery disease   .  CHF (congestive heart failure)   . Mental disorder     BIPOLAR  . Shortness of breath   . GERD (gastroesophageal reflux disease)   . Neuromuscular disorder     DIABETIC NEUROPATHY  . Ischemic cardiomyopathy   . Bipolar disorder   . Post-traumatic stress syndrome    Past Surgical History  Procedure Laterality Date  . Coronary angioplasty with stent placement      CAD in 2006 x 2 and 2009 2 more- place din REx in [place] [facility] [place] [facility]   Family History  Problem Relation Age of Onset  . Stroke Mother   . Cancer Father    History  Substance Use Topics  . Smoking status: Former Smoker    Types: Cigarettes    Quit date: 11/24/2012  . Smokeless tobacco: Never Used  . Alcohol Use: No   OB History   Grav Para Term Preterm Abortions TAB SAB Ect Mult Living                 Review of Systems  Constitutional: Positive for appetite change. Negative for fever, chills, diaphoresis, activity change and fatigue.  HENT: Negative for congestion, ear pain, rhinorrhea and sore throat.   Respiratory: Negative for cough, chest tightness, shortness of breath and wheezing.   Cardiovascular: Positive for leg swelling. Negative for chest pain.  Gastrointestinal: Positive for nausea, vomiting, constipation and blood in stool. Negative for abdominal pain, diarrhea, anal bleeding and rectal pain.  Genitourinary: Negative for dysuria, hematuria, decreased urine volume, vaginal bleeding, vaginal discharge, difficulty urinating and vaginal pain.  Musculoskeletal: Negative for back pain, myalgias and neck pain.  Neurological: Negative for dizziness, syncope, weakness, light-headedness and headaches.    Allergies  Nsaids and Metolazone  Home Medications   Current Outpatient Rx  Name  Route  Sig  Dispense  Refill  . albuterol (PROVENTIL HFA;VENTOLIN HFA) 108 (90 BASE) MCG/ACT inhaler   Inhalation   Inhale 2 puffs into the lungs every 6 (six) hours as needed for wheezing.         [name] Kitchen albuterol  (PROVENTIL) (2.5 MG/3ML) 0.083% nebulizer solution   Nebulization   Take 6 mLs (5 mg total) by nebulization every 4 (four) hours as needed for wheezing or shortness of breath.   75 mL   12   . carvedilol (COREG) 25 MG tablet   Oral   Take 1 tablet (25 mg total) by mouth 2 (two) times daily with a meal.   60 tablet   1   . diazepam (VALIUM) 10 MG tablet   Oral   Take 0.5 tablets (5 mg total) by mouth every 8 (eight) hours as needed for anxiety.   30 tablet   0   . FLUoxetine (PROZAC) 20 MG capsule   Oral   Take 1 capsule (20 mg total) by mouth daily.   30 capsule   3   . fluticasone (FLONASE) 50 MCG/ACT nasal spray   Nasal   Place 2 sprays into the nose every morning.          . furosemide (LASIX) 40 MG tablet   Oral   Take 40-80 mg by mouth 3 (three) times daily. Take 2 tabs in the morning. 1 tab at noon. 2 tabs in the evening.         . insulin glargine (LANTUS) 100 UNIT/ML injection   Subcutaneous   Inject 55 Units into the skin at bedtime.         . insulin lispro (HUMALOG) 100 UNIT/ML injection   Subcutaneous   Inject 0-20 Units into the skin 3 (three) times daily before meals. SSI based on BG         . ipratropium-albuterol (DUONEB) 0.5-2.5 (3) MG/3ML SOLN   Nebulization   Take 3 mLs by nebulization every 6 (six) hours as needed (wheezing).         . metoCLOPramide (REGLAN) 10 MG tablet   Oral   Take 1 tablet (10 mg total) by mouth 4 (four) times daily -  before meals and at bedtime.   120 tablet   0   . nitroGLYCERIN (NITROSTAT) 0.4 MG SL tablet   Sublingual   Place 1 tablet (0.4 mg total) under the tongue every 5 (five) minutes as needed for chest pain.   100 tablet   3   . omeprazole (PRILOSEC) 40 MG capsule   Oral   Take 40 mg by mouth 2 (two) times daily.         . ondansetron (ZOFRAN) 4 MG tablet   Oral   Take 1 tablet (4 mg total) by mouth 4 (four) times daily as needed for nausea or vomiting.   20 tablet   0   . potassium  chloride (K-DUR) 10 MEQ tablet   Oral   Take 10 mEq by mouth 2 (two) times daily.          . pravastatin (PRAVACHOL) 40 MG tablet   Oral   Take 40  mg by mouth every evening.          . Rivaroxaban (XARELTO) 20 MG TABS   Oral   Take 1 tablet (20 mg total) by mouth daily with supper.   30 tablet   1   . tiZANidine (ZANAFLEX) 4 MG tablet   Oral   Take 0.5 tablets (2 mg total) by mouth every 6 (six) hours as needed for muscle spasms.   30 tablet   0   . traZODone (DESYREL) 100 MG tablet   Oral   Take 100 mg by mouth at bedtime.          BP 146/71  Pulse 81  Temp(Src) 98 F (36.7 C) (Oral)  Resp 17  SpO2 100%  LMP 07/06/2013  Filed Vitals:   07/28/13 1558 07/28/13 1601 07/28/13 1730  BP:  146/71 140/71  Pulse:  81 81  Temp:  98 F (36.7 C) 98.6 F (37 C)  TempSrc:  Oral Oral  Resp:  17 15  SpO2: 94% 100% 89%    Physical Exam  Nursing note and vitals reviewed. Constitutional: She is oriented to person, place, and time. She appears well-developed and well-nourished. No distress.  HENT:  Head: Normocephalic and atraumatic.  Right Ear: External ear normal.  Left Ear: External ear normal.  Nose: Nose normal.  Mouth/Throat: Oropharynx is clear and moist.  Eyes: Conjunctivae are normal. Right eye exhibits no discharge. Left eye exhibits no discharge.  Neck: Normal range of motion. Neck supple.  Cardiovascular: Normal rate, regular rhythm, normal heart sounds and intact distal pulses.  Exam reveals no gallop and no friction rub.   No murmur heard. Pulmonary/Chest: Effort normal and breath sounds normal. No respiratory distress. She has no wheezes. She has no rales. She exhibits no tenderness.  Fine crackles heard in the lung bases bilaterally. Intermittent wheezing heard throughout.   Abdominal: Soft. Bowel sounds are normal. She exhibits no distension and no mass. There is no tenderness. There is no rebound and no guarding.  Musculoskeletal: Normal range of  motion. She exhibits edema. She exhibits no tenderness.  No flank, CVA, or lumbar tenderness bilaterally. Trace pitting edema equal bilaterally  Neurological: She is alert and oriented to person, place, and time.  Skin: Skin is warm and dry. She is not diaphoretic.    ED Course  Procedures (including critical care time) Labs Review Labs Reviewed  CBC WITH DIFFERENTIAL  COMPREHENSIVE METABOLIC PANEL  URINALYSIS, ROUTINE W REFLEX MICROSCOPIC  LIPASE, BLOOD  POC URINE PREG, ED   Imaging Review No results found.   EKG Interpretation None      DG Chest 2 View (Final result)  Result time: 07/28/13 17:26:48    Final result by Rad Results In Interface (07/28/13 17:26:48)    Narrative:   CLINICAL DATA: Chest pain  EXAM: CHEST 2 VIEW  COMPARISON: 06/29/2013  FINDINGS: Cardiomediastinal silhouette is stable. Stable scarring in left upper lobe. Chronic interstitial prominence and chronic mild bronchitic changes. No acute infiltrate or pulmonary edema. Bony thorax is stable.  IMPRESSION: No acute infiltrate or pulmonary edema. Stable chronic interstitial prominence and mild bronchitic changes. Again noted scarring in left upper lobe.   Electronically Signed By: Lahoma Crocker M.D. On: 07/28/2013 17:26   Results for orders placed during the hospital encounter of 07/28/13  CBC WITH DIFFERENTIAL      Result Value Ref Range   WBC 8.4  4.0 - 10.5 K/uL   RBC 4.66  3.87 - 5.11 MIL/uL   Hemoglobin  12.3  12.0 - 15.0 g/dL   HCT 37.9  36.0 - 46.0 %   MCV 81.3  78.0 - 100.0 fL   MCH 26.4  26.0 - 34.0 pg   MCHC 32.5  30.0 - 36.0 g/dL   RDW 17.2 (*) 11.5 - 15.5 %   Platelets 325  150 - 400 K/uL   Neutrophils Relative % 69  43 - 77 %   Lymphocytes Relative 26  12 - 46 %   Monocytes Relative 5  3 - 12 %   Eosinophils Relative 0  0 - 5 %   Basophils Relative 0  0 - 1 %   Neutro Abs 5.8  1.7 - 7.7 K/uL   Lymphs Abs 2.2  0.7 - 4.0 K/uL   Monocytes Absolute 0.4  0.1 - 1.0 K/uL    Eosinophils Absolute 0.0  0.0 - 0.7 K/uL   Basophils Absolute 0.0  0.0 - 0.1 K/uL   Smear Review MORPHOLOGY UNREMARKABLE    URINALYSIS, ROUTINE W REFLEX MICROSCOPIC      Result Value Ref Range   Color, Urine YELLOW  YELLOW   APPearance CLEAR  CLEAR   Specific Gravity, Urine 1.017  1.005 - 1.030   pH 7.0  5.0 - 8.0   Glucose, UA NEGATIVE  NEGATIVE mg/dL   Hgb urine dipstick NEGATIVE  NEGATIVE   Bilirubin Urine NEGATIVE  NEGATIVE   Ketones, ur NEGATIVE  NEGATIVE mg/dL   Protein, ur NEGATIVE  NEGATIVE mg/dL   Urobilinogen, UA 1.0  0.0 - 1.0 mg/dL   Nitrite NEGATIVE  NEGATIVE   Leukocytes, UA NEGATIVE  NEGATIVE  COMPREHENSIVE METABOLIC PANEL      Result Value Ref Range   Sodium 140  137 - 147 mEq/L   Potassium 4.0  3.7 - 5.3 mEq/L   Chloride 98  96 - 112 mEq/L   CO2 26  19 - 32 mEq/L   Glucose, Bld 194 (*) 70 - 99 mg/dL   BUN 11  6 - 23 mg/dL   Creatinine, Ser 0.88  0.50 - 1.10 mg/dL   Calcium 9.0  8.4 - 10.5 mg/dL   Total Protein 7.8  6.0 - 8.3 g/dL   Albumin 3.4 (*) 3.5 - 5.2 g/dL   AST 14  0 - 37 U/L   ALT 15  0 - 35 U/L   Alkaline Phosphatase 63  39 - 117 U/L   Total Bilirubin <0.2 (*) 0.3 - 1.2 mg/dL   GFR calc non Af Amer 74 (*) >90 mL/min   GFR calc Af Amer 85 (*) >90 mL/min  LIPASE, BLOOD      Result Value Ref Range   Lipase 15  11 - 59 U/L  TROPONIN I      Result Value Ref Range   Troponin I <0.30  <0.30 ng/mL   Fecal occult blood - negative  Pregnancy test - negative   MDM   Tamara Silva is a 53 y.o. female with a PMH of DM, PE, HLD, HTN, COPD, asthma, CAD s/p stent, CHF, bipolar disorder, GERD, ischemic cardiomyopathy, PTSD who presents to the ED for evaluation of emesis.   Rechecks  8:00 PM = Rectal exam performed at bedside with staff present. No gross blood on exam. Minimal amount of brown stool in the rectal vault. No external hemorrhoids or fissures. No palpable masses or hemorrhoids palpated in the rectal vault.  8:45 PM = Patient previously taken off  of O2 and her pulse ox 88% on room air.  Put on 2L nasal canula and pulse ox improved to 100%. No SOB or respiratory distress.  9:00 PM = RN trying to get IV. Will take off of O2 again and re-check.  9:05 PM = Pulse ox dropped to 90% on room air. Put back on 2L O2.   Consults  9:30 PM = Spoke with Dr. Roel Cluck who agrees to admit for further evaluation of hypoxia. Will order CT to evaluate for PE.     Patient evaluated for chronic nausea and vomiting, which is thought to be related to gastroparesis. Patient recently admitted and discharged for evaluation of this and returns today with continued nausea and vomiting. Patient incidently found to be hypoxic with no clear etiology (high 80's and low 90's on room air). No complaints of cough, SOB, or chest pain. Has hx of PE and is currently on xarelto with no alleged missed doses. CT angio chest results pending. Chest x-ray negative for an acute cardiopulmonary process. COPD, asthma, and/or CHF may also be contributing causes of her hypoxia. EKG negative for any acute ischemic changes. Labs unremarkable. Patient responded well to 2L O2 via nasal canula and remained in no respiratory distress. Patient in agreement with admission and plan.   Final impressions: 1. Hypoxia   2. COPD (chronic obstructive pulmonary disease)   3. Diabetes mellitus   4. Gastroparesis   5. Intractable nausea and vomiting      Mercy Moore PA-C   This patient was discussed with Dr. Javier Docker, PA-C 07/30/13 1044

## 2013-07-28 NOTE — ED Notes (Signed)
IV team attempted IV insertion, unable to obtain. PA aware.

## 2013-07-29 ENCOUNTER — Encounter (HOSPITAL_COMMUNITY): Payer: Self-pay | Admitting: *Deleted

## 2013-07-29 DIAGNOSIS — I2782 Chronic pulmonary embolism: Secondary | ICD-10-CM

## 2013-07-29 LAB — GLUCOSE, CAPILLARY
GLUCOSE-CAPILLARY: 173 mg/dL — AB (ref 70–99)
GLUCOSE-CAPILLARY: 200 mg/dL — AB (ref 70–99)
Glucose-Capillary: 191 mg/dL — ABNORMAL HIGH (ref 70–99)
Glucose-Capillary: 192 mg/dL — ABNORMAL HIGH (ref 70–99)
Glucose-Capillary: 174 mg/dL — ABNORMAL HIGH (ref 70–99)
Glucose-Capillary: 177 mg/dL — ABNORMAL HIGH (ref 70–99)

## 2013-07-29 LAB — COMPREHENSIVE METABOLIC PANEL
ALBUMIN: 3.3 g/dL — AB (ref 3.5–5.2)
ALK PHOS: 56 U/L (ref 39–117)
ALT: 15 U/L (ref 0–35)
AST: 13 U/L (ref 0–37)
BUN: 11 mg/dL (ref 6–23)
CALCIUM: 8.9 mg/dL (ref 8.4–10.5)
CO2: 29 mEq/L (ref 19–32)
Chloride: 97 mEq/L (ref 96–112)
Creatinine, Ser: 0.82 mg/dL (ref 0.50–1.10)
GFR calc Af Amer: >90 mL/min (ref >90)
GFR calc non Af Amer: 80 mL/min — ABNORMAL LOW (ref >90)
GLUCOSE: 207 mg/dL — AB (ref 70–99)
POTASSIUM: 4 meq/L (ref 3.7–5.3)
Sodium: 140 mEq/L (ref 137–147)
Total Bilirubin: 0.2 mg/dL — ABNORMAL LOW (ref 0.3–1.2)
Total Protein: 7.3 g/dL (ref 6.0–8.3)

## 2013-07-29 LAB — MAGNESIUM: MAGNESIUM: 2 mg/dL (ref 1.5–2.5)

## 2013-07-29 LAB — CBC
HEMATOCRIT: 35.4 % — AB (ref 36.0–46.0)
Hemoglobin: 11 g/dL — ABNORMAL LOW (ref 12.0–15.0)
MCH: 25.6 pg — ABNORMAL LOW (ref 26.0–34.0)
MCHC: 31.1 g/dL (ref 30.0–36.0)
MCV: 82.3 fL (ref 78.0–100.0)
Platelets: 284 10*3/uL (ref 150–400)
RBC: 4.3 MIL/uL (ref 3.87–5.11)
RDW: 17.2 % — ABNORMAL HIGH (ref 11.5–15.5)
WBC: 8.7 10*3/uL (ref 4.0–10.5)

## 2013-07-29 LAB — HEMOGLOBIN A1C
Hgb A1c MFr Bld: 8.1 % — ABNORMAL HIGH (ref <5.7)
Mean Plasma Glucose: 186 mg/dL — ABNORMAL HIGH (ref <117)

## 2013-07-29 LAB — PHOSPHORUS: PHOSPHORUS: 3.2 mg/dL (ref 2.3–4.6)

## 2013-07-29 MED ORDER — FUROSEMIDE 40 MG PO TABS
40.0000 mg | ORAL_TABLET | Freq: Two times a day (BID) | ORAL | Status: DC
  Administered 2013-07-29: 40 mg via ORAL
  Filled 2013-07-29 (×3): qty 1

## 2013-07-29 MED ORDER — INSULIN GLARGINE 100 UNIT/ML ~~LOC~~ SOLN
35.0000 [IU] | Freq: Every day | SUBCUTANEOUS | Status: DC
  Administered 2013-07-29 – 2013-08-03 (×6): 35 [IU] via SUBCUTANEOUS
  Filled 2013-07-29 (×8): qty 0.35

## 2013-07-29 MED ORDER — INSULIN GLARGINE 100 UNIT/ML ~~LOC~~ SOLN
55.0000 [IU] | Freq: Every day | SUBCUTANEOUS | Status: DC
  Administered 2013-07-29: 55 [IU] via SUBCUTANEOUS
  Filled 2013-07-29 (×2): qty 0.55

## 2013-07-29 MED ORDER — RIVAROXABAN 20 MG PO TABS
20.0000 mg | ORAL_TABLET | Freq: Every day | ORAL | Status: DC
  Administered 2013-07-30 – 2013-08-01 (×3): 20 mg via ORAL
  Filled 2013-07-29 (×5): qty 1

## 2013-07-29 MED ORDER — FLEET ENEMA 7-19 GM/118ML RE ENEM: 1.0000 | ENEMA | Freq: Once | RECTAL | Status: AC | PRN

## 2013-07-29 MED ORDER — SODIUM CHLORIDE 0.9 % IV SOLN
500.0000 mg | Freq: Three times a day (TID) | INTRAVENOUS | Status: DC
  Administered 2013-07-29 (×2): 500 mg via INTRAVENOUS
  Filled 2013-07-29 (×4): qty 10

## 2013-07-29 MED ORDER — ALBUTEROL SULFATE HFA 108 (90 BASE) MCG/ACT IN AERS: 2.0000 | INHALATION_SPRAY | Freq: Four times a day (QID) | RESPIRATORY_TRACT | Status: DC | PRN

## 2013-07-29 MED ORDER — SIMVASTATIN 20 MG PO TABS
20.0000 mg | ORAL_TABLET | Freq: Every day | ORAL | Status: DC
  Filled 2013-07-29: qty 1

## 2013-07-29 MED ORDER — DIAZEPAM 5 MG PO TABS
5.0000 mg | ORAL_TABLET | Freq: Three times a day (TID) | ORAL | Status: DC | PRN
  Administered 2013-08-02: 5 mg via ORAL
  Filled 2013-07-29 (×2): qty 1

## 2013-07-29 MED ORDER — INSULIN ASPART 100 UNIT/ML ~~LOC~~ SOLN
0.0000 [IU] | SUBCUTANEOUS | Status: DC
  Administered 2013-07-29: 05:00:00 via SUBCUTANEOUS
  Administered 2013-07-29 – 2013-07-30 (×4): 2 [IU] via SUBCUTANEOUS

## 2013-07-29 MED ORDER — ACETAMINOPHEN 325 MG PO TABS: 650.0000 mg | ORAL_TABLET | Freq: Four times a day (QID) | ORAL | Status: DC | PRN

## 2013-07-29 MED ORDER — TRAZODONE HCL 100 MG PO TABS
100.0000 mg | ORAL_TABLET | Freq: Every day | ORAL | Status: DC
  Administered 2013-07-29 – 2013-08-03 (×7): 100 mg via ORAL
  Filled 2013-07-29 (×8): qty 1

## 2013-07-29 MED ORDER — ALBUTEROL SULFATE (2.5 MG/3ML) 0.083% IN NEBU: 5.0000 mg | INHALATION_SOLUTION | RESPIRATORY_TRACT | Status: DC | PRN

## 2013-07-29 MED ORDER — ONDANSETRON HCL 4 MG/2ML IJ SOLN
4.0000 mg | Freq: Four times a day (QID) | INTRAMUSCULAR | Status: DC | PRN
  Administered 2013-07-29 – 2013-08-03 (×2): 4 mg via INTRAVENOUS
  Filled 2013-07-29: qty 2

## 2013-07-29 MED ORDER — IPRATROPIUM-ALBUTEROL 0.5-2.5 (3) MG/3ML IN SOLN: 3.0000 mL | Freq: Four times a day (QID) | RESPIRATORY_TRACT | Status: DC | PRN

## 2013-07-29 MED ORDER — ERYTHROMYCIN ETHYLSUCCINATE 200 MG/5ML PO SUSR
400.0000 mg | Freq: Three times a day (TID) | ORAL | Status: DC
  Administered 2013-07-30 – 2013-08-03 (×14): 400 mg via ORAL
  Filled 2013-07-29 (×13): qty 10
  Filled 2013-07-29: qty 5
  Filled 2013-07-29 (×5): qty 10
  Filled 2013-07-29: qty 5

## 2013-07-29 MED ORDER — SODIUM CHLORIDE 0.9 % IJ SOLN
3.0000 mL | Freq: Two times a day (BID) | INTRAMUSCULAR | Status: DC
  Administered 2013-07-29 (×2): 3 mL via INTRAVENOUS

## 2013-07-29 MED ORDER — SENNA 8.6 MG PO TABS
1.0000 | ORAL_TABLET | Freq: Two times a day (BID) | ORAL | Status: DC
  Administered 2013-07-29 – 2013-08-04 (×11): 8.6 mg via ORAL
  Filled 2013-07-29 (×6): qty 1

## 2013-07-29 MED ORDER — ONDANSETRON HCL 4 MG PO TABS
4.0000 mg | ORAL_TABLET | Freq: Four times a day (QID) | ORAL | Status: DC | PRN
  Administered 2013-07-29 – 2013-08-01 (×6): 4 mg via ORAL
  Filled 2013-07-29 (×6): qty 1

## 2013-07-29 MED ORDER — HYDROCODONE-ACETAMINOPHEN 5-325 MG PO TABS
1.0000 | ORAL_TABLET | ORAL | Status: DC | PRN
  Administered 2013-08-01 – 2013-08-02 (×5): 2 via ORAL
  Filled 2013-07-29 (×5): qty 2

## 2013-07-29 MED ORDER — BISACODYL 10 MG RE SUPP
10.0000 mg | Freq: Every day | RECTAL | Status: DC | PRN
  Administered 2013-07-30: 10 mg via RECTAL
  Filled 2013-07-29 (×2): qty 1

## 2013-07-29 MED ORDER — SODIUM CHLORIDE 0.9 % IJ SOLN: 3.0000 mL | INTRAMUSCULAR | Status: DC | PRN

## 2013-07-29 MED ORDER — GUAIFENESIN ER 600 MG PO TB12
600.0000 mg | ORAL_TABLET | Freq: Two times a day (BID) | ORAL | Status: DC
  Administered 2013-07-29 – 2013-08-04 (×13): 600 mg via ORAL
  Filled 2013-07-29 (×15): qty 1

## 2013-07-29 MED ORDER — FLUOXETINE HCL 20 MG PO CAPS
20.0000 mg | ORAL_CAPSULE | Freq: Every day | ORAL | Status: DC
  Administered 2013-07-30 – 2013-08-04 (×6): 20 mg via ORAL
  Filled 2013-07-29 (×7): qty 1

## 2013-07-29 MED ORDER — PANTOPRAZOLE SODIUM 40 MG PO TBEC
40.0000 mg | DELAYED_RELEASE_TABLET | Freq: Every day | ORAL | Status: DC
  Administered 2013-07-30 – 2013-08-04 (×6): 40 mg via ORAL
  Filled 2013-07-29 (×7): qty 1

## 2013-07-29 MED ORDER — TIZANIDINE HCL 2 MG PO TABS
2.0000 mg | ORAL_TABLET | Freq: Four times a day (QID) | ORAL | Status: DC | PRN
  Filled 2013-07-29: qty 1

## 2013-07-29 MED ORDER — METOCLOPRAMIDE HCL 10 MG PO TABS
10.0000 mg | ORAL_TABLET | Freq: Three times a day (TID) | ORAL | Status: DC
  Administered 2013-07-29 – 2013-08-01 (×12): 10 mg via ORAL
  Filled 2013-07-29 (×19): qty 1

## 2013-07-29 MED ORDER — FLUTICASONE PROPIONATE 50 MCG/ACT NA SUSP
2.0000 | Freq: Every day | NASAL | Status: DC
  Administered 2013-07-30 – 2013-08-04 (×7): 2 via NASAL
  Filled 2013-07-29: qty 16

## 2013-07-29 MED ORDER — CARVEDILOL 25 MG PO TABS
25.0000 mg | ORAL_TABLET | Freq: Two times a day (BID) | ORAL | Status: DC
  Administered 2013-07-29 – 2013-08-04 (×12): 25 mg via ORAL
  Filled 2013-07-29 (×15): qty 1

## 2013-07-29 MED ORDER — ACETAMINOPHEN 650 MG RE SUPP: 650.0000 mg | Freq: Four times a day (QID) | RECTAL | Status: DC | PRN

## 2013-07-29 MED ORDER — DOCUSATE SODIUM 100 MG PO CAPS
100.0000 mg | ORAL_CAPSULE | Freq: Two times a day (BID) | ORAL | Status: DC
  Administered 2013-07-29 – 2013-08-04 (×13): 100 mg via ORAL
  Filled 2013-07-29 (×9): qty 1

## 2013-07-29 MED ORDER — POLYETHYLENE GLYCOL 3350 17 G PO PACK
17.0000 g | PACK | Freq: Every day | ORAL | Status: DC | PRN
  Filled 2013-07-29: qty 1

## 2013-07-29 MED ORDER — POTASSIUM CHLORIDE ER 10 MEQ PO TBCR
10.0000 meq | EXTENDED_RELEASE_TABLET | Freq: Two times a day (BID) | ORAL | Status: DC
  Administered 2013-07-29 – 2013-08-01 (×6): 10 meq via ORAL
  Filled 2013-07-29 (×8): qty 1

## 2013-07-29 MED ORDER — SODIUM CHLORIDE 0.9 % IV SOLN: 250.0000 mL | INTRAVENOUS | Status: DC | PRN

## 2013-07-29 MED ORDER — ATORVASTATIN CALCIUM 10 MG PO TABS
10.0000 mg | ORAL_TABLET | Freq: Every day | ORAL | Status: DC
  Administered 2013-07-30 – 2013-08-03 (×5): 10 mg via ORAL
  Filled 2013-07-29 (×7): qty 1

## 2013-07-29 NOTE — Progress Notes (Signed)
[facility] PROGRESS NOTE  Tamara Silva [id number] DOB: 1960-08-15 DOA: 07/28/2013 PCP: Philis Fendt, MD  Assessment/Plan  Nausea & vomiting - most likely secondary to gastroparesis will resume Reglan that she have had great improvement but then passed.  -  D/c lasix -  No IV access currently -  Encourage small sips frequently overnight -  Start erythromycin  -  Continue zofran and reglan -  Advance diet as tolerated -  Will try to avoid PICC line if possible  . Essential hypertension - continue home medications  . COPD (chronic obstructive pulmonary disease) - currently appears to be stable patient as history of COPD and likely half oxygen requirement at baseline. Will ambulate and check oxygenation prior to discharge she would likely benefit from home O2  . Chronic diastolic congestive heart failure -  Hold lasix for now given minimal oral intake and no PIV for IVF -  Restart ASAP . Hx of pulmonary embolus - continue xarelto and hypoxia could be secondary to history of PE with evidence of pulmonary artery narrowing  . Diabetes mellitus  -  Decrease lantus 2/2 poor oral intake  -  Continue sliding scale insulin   . Hypoxia - multifactorial patient has history of COPD.  As well as CHF which appears to be euvolemic. As well as history of PE. CT scan showing evidence of pulmonary artery stenosis likely secondary to old PE but no new findings. Patient likely benefit from home O2.  -  Check walking pulse ox  Diet:  CLD Access:  none IVF:  none Proph:  xarelto  Code Status: full Family Communication: patietn alone Disposition Plan: pending able to stay hydrated   Consultants:  none  Procedures:  CT angio chest  CXR  Antibiotics:  none   HPI/Subjective:  Still having hiccoughs and nausea.  Spitting but not vomiting.  Minimal oral intake today.  Had BM in last 24 hours, normal.    Objective: Filed Vitals:   07/29/13 0115 07/29/13 0444 07/29/13 1000  07/29/13 1333  BP: 142/84 140/83 166/103 145/78  Pulse: 83 82 89 82  Temp: 97.6 F (36.4 C) 97.6 F (36.4 C) 97.8 F (36.6 C)   TempSrc: Oral Oral Oral   Resp: 20 16 20 20   Height: 5\' 7"  (1.702 m)     Weight: 135.7 kg (299 lb 2.6 oz)     SpO2: 95% 98% 87% 100%    Intake/Output Summary (Last 24 hours) at 07/29/13 2013 Last data filed at 07/29/13 1700  Gross per 24 hour  Intake    320 ml  Output    550 ml  Net   -230 ml   Filed Weights   07/29/13 0115  Weight: 135.7 kg (299 lb 2.6 oz)    Exam:   General:  Obese AAF, No acute distress  HEENT:  NCAT, MMM  Cardiovascular:  RRR, nl S1, S2 no mrg, 2+ pulses, warm extremities  Respiratory:  Diminished bilateral BS, no focal rales or rhonchi, no increased WOB  Abdomen:   NABS, soft, NT/ND  MSK:   Normal tone and bulk, no LEE  Neuro:  Grossly intact  Data Reviewed: Basic Metabolic Panel:  Recent Labs Lab 07/28/13 1950 07/29/13 0517  NA 140 140  K 4.0 4.0  CL 98 97  CO2 26 29  GLUCOSE 194* 207*  BUN 11 11  CREATININE 0.88 0.82  CALCIUM 9.0 8.9  MG  --  2.0  PHOS  --  3.2   Liver Function  Tests:  Recent Labs Lab 07/28/13 1950 07/29/13 0517  AST 14 13  ALT 15 15  ALKPHOS 63 56  BILITOT <0.2* 0.2*  PROT 7.8 7.3  ALBUMIN 3.4* 3.3*    Recent Labs Lab 07/28/13 1950  LIPASE 15   No results found for this basename: AMMONIA,  in the last 168 hours CBC:  Recent Labs Lab 07/28/13 1907 07/29/13 0517  WBC 8.4 8.7  NEUTROABS 5.8  --   HGB 12.3 11.0*  HCT 37.9 35.4*  MCV 81.3 82.3  PLT 325 284   Cardiac Enzymes:  Recent Labs Lab 07/28/13 1950  TROPONINI <0.30   BNP (last 3 results)  Recent Labs  06/24/13 1838 06/29/13 1430 07/01/13 0235  PROBNP 256.4* 770.4* 1194.0*   CBG:  Recent Labs Lab 07/29/13 0120 07/29/13 0452 07/29/13 0719 07/29/13 1124 07/29/13 1629  GLUCAP 191* 192* 173* 174* 200*    No results found for this or any previous visit (from the past 240 hour(s)).    Studies: Dg Chest 2 View  07/28/2013   CLINICAL DATA:  Chest pain  EXAM: CHEST  2 VIEW  COMPARISON:  06/29/2013  FINDINGS: Cardiomediastinal silhouette is stable. Stable scarring in left upper lobe. Chronic interstitial prominence and chronic mild bronchitic changes. No acute infiltrate or pulmonary edema. Bony thorax is stable.  IMPRESSION: No acute infiltrate or pulmonary edema. Stable chronic interstitial prominence and mild bronchitic changes. Again noted scarring in left upper lobe.   Electronically Signed   By: Lahoma Crocker M.D.   On: 07/28/2013 17:26   Ct Angio Chest Pe W/cm &/or Wo Cm  07/28/2013   CLINICAL DATA:  Hypoxia, prior PE.  EXAM: CT ANGIOGRAPHY CHEST WITH CONTRAST  TECHNIQUE: Multidetector CT imaging of the chest was performed using the standard protocol during bolus administration of intravenous contrast. Multiplanar CT image reconstructions and MIPs were obtained to evaluate the vascular anatomy.  CONTRAST:  128mL OMNIPAQUE IOHEXOL 350 MG/ML SOLN  COMPARISON:  CTA chest dated 01/23/2013  FINDINGS: Narrowing/occlusion of the right lower lobe pulmonary artery (series 10/image 133), favored to reflect sequela of prior/chronic pulmonary embolism. No findings to suggest acute pulmonary embolism.  Left upper lobe scarring with overlying calcified pleural plaques. Left lower lobe platelike scarring. Trace left pleural effusion. Mild right lower lobe atelectasis/scarring. Moderate paraseptal emphysematous changes. No pneumothorax.  Visualized thyroid is unremarkable.  Heart is mildly enlarged. Thinning involving the left ventricular apex, likely the sequela of prior MI. No pericardial effusion. Coronary atherosclerosis with percutaneous stent.  Small mediastinal lymph nodes, including a 10 mm Tamara Silva axis high right paratracheal node (series 4/image 17) and a 12 mm subcarinal node (series 4/image 39). No suspicious hilar or axillary lymphadenopathy.  Visualized upper abdomen is unremarkable.   Visualized osseous structures are within normal limits.  Review of the MIP images confirms the above findings.  IMPRESSION: Narrowing/occlusion of the right lower lobe pulmonary artery, favored to reflect sequela of prior/chronic pulmonary embolism.  No findings to suggest acute pulmonary embolism.  Additional stable ancillary findings as above.   Electronically Signed   By: Julian Hy M.D.   On: 07/28/2013 23:03    Scheduled Meds: . atorvastatin  10 mg Oral q1800  . carvedilol  25 mg Oral BID WC  . docusate sodium  100 mg Oral BID  . erythromycin  400 mg Oral TID  . FLUoxetine  20 mg Oral Daily  . fluticasone  2 spray Each Nare Daily  . guaiFENesin  600 mg Oral BID  .  insulin aspart  0-9 Units Subcutaneous 6 times per day  . insulin glargine  35 Units Subcutaneous QHS  . metoCLOPramide  10 mg Oral TID AC & HS  . pantoprazole  40 mg Oral Daily  . potassium chloride  10 mEq Oral BID  . Rivaroxaban  20 mg Oral Q breakfast  . senna  1 tablet Oral BID  . traZODone  100 mg Oral QHS   Continuous Infusions:   Active Problems:   Essential hypertension   COPD (chronic obstructive pulmonary disease)   Intractable nausea and vomiting   Chronic diastolic congestive heart failure   Nausea & vomiting   Gastroparesis   Hx of pulmonary embolus   Diabetes mellitus   Hypoxia    Time spent: 30 min    Darrel Baroni, [facility] Pager [phone]. If 7PM-7AM, please contact night-coverage at [email or web address], password [email or web address] 07/29/2013, 8:13 PM  LOS: 1 day

## 2013-07-30 ENCOUNTER — Encounter (HOSPITAL_COMMUNITY): Payer: Self-pay | Admitting: *Deleted

## 2013-07-30 DIAGNOSIS — I509 Heart failure, unspecified: Secondary | ICD-10-CM

## 2013-07-30 DIAGNOSIS — I5032 Chronic diastolic (congestive) heart failure: Secondary | ICD-10-CM

## 2013-07-30 LAB — BASIC METABOLIC PANEL
BUN: 10 mg/dL (ref 6–23)
CHLORIDE: 97 meq/L (ref 96–112)
CO2: 31 mEq/L (ref 19–32)
Calcium: 8.9 mg/dL (ref 8.4–10.5)
Creatinine, Ser: 0.72 mg/dL (ref 0.50–1.10)
GFR calc non Af Amer: >90 mL/min (ref >90)
Glucose, Bld: 171 mg/dL — ABNORMAL HIGH (ref 70–99)
Potassium: 3.8 mEq/L (ref 3.7–5.3)
Sodium: 138 mEq/L (ref 137–147)

## 2013-07-30 LAB — CBC
HCT: 36.4 % (ref 36.0–46.0)
Hemoglobin: 11.2 g/dL — ABNORMAL LOW (ref 12.0–15.0)
MCH: 25.6 pg — ABNORMAL LOW (ref 26.0–34.0)
MCHC: 30.8 g/dL (ref 30.0–36.0)
MCV: 83.1 fL (ref 78.0–100.0)
PLATELETS: 287 10*3/uL (ref 150–400)
RBC: 4.38 MIL/uL (ref 3.87–5.11)
RDW: 16.7 % — AB (ref 11.5–15.5)
WBC: 9.5 10*3/uL (ref 4.0–10.5)

## 2013-07-30 LAB — GLUCOSE, CAPILLARY
GLUCOSE-CAPILLARY: 167 mg/dL — AB (ref 70–99)
GLUCOSE-CAPILLARY: 161 mg/dL — AB (ref 70–99)
Glucose-Capillary: 140 mg/dL — ABNORMAL HIGH (ref 70–99)
Glucose-Capillary: 151 mg/dL — ABNORMAL HIGH (ref 70–99)
Glucose-Capillary: 163 mg/dL — ABNORMAL HIGH (ref 70–99)
Glucose-Capillary: 186 mg/dL — ABNORMAL HIGH (ref 70–99)

## 2013-07-30 LAB — TSH: TSH: 0.708 u[IU]/mL (ref 0.350–4.500)

## 2013-07-30 MED ORDER — PREDNISONE 20 MG PO TABS
40.0000 mg | ORAL_TABLET | Freq: Every day | ORAL | Status: DC
  Administered 2013-07-30 – 2013-08-02 (×4): 40 mg via ORAL
  Filled 2013-07-30 (×5): qty 2

## 2013-07-30 MED ORDER — IPRATROPIUM-ALBUTEROL 0.5-2.5 (3) MG/3ML IN SOLN
3.0000 mL | Freq: Three times a day (TID) | RESPIRATORY_TRACT | Status: DC
  Administered 2013-07-30 – 2013-08-04 (×14): 3 mL via RESPIRATORY_TRACT
  Filled 2013-07-30 (×17): qty 3

## 2013-07-30 MED ORDER — GLUCERNA SHAKE PO LIQD
237.0000 mL | Freq: Three times a day (TID) | ORAL | Status: DC
  Administered 2013-07-30 – 2013-07-31 (×4): 237 mL via ORAL
  Filled 2013-07-30 (×9): qty 237

## 2013-07-30 MED ORDER — INSULIN ASPART 100 UNIT/ML ~~LOC~~ SOLN
0.0000 [IU] | Freq: Three times a day (TID) | SUBCUTANEOUS | Status: DC
  Administered 2013-07-31: 2 [IU] via SUBCUTANEOUS
  Administered 2013-07-31: 1 [IU] via SUBCUTANEOUS
  Administered 2013-07-31 – 2013-08-01 (×3): 2 [IU] via SUBCUTANEOUS
  Administered 2013-08-01: 1 [IU] via SUBCUTANEOUS
  Administered 2013-08-02 (×2): 3 [IU] via SUBCUTANEOUS
  Administered 2013-08-02: 2 [IU] via SUBCUTANEOUS
  Administered 2013-08-04: 5 [IU] via SUBCUTANEOUS

## 2013-07-30 MED ORDER — FUROSEMIDE 40 MG PO TABS
40.0000 mg | ORAL_TABLET | Freq: Two times a day (BID) | ORAL | Status: DC
  Administered 2013-07-30 – 2013-07-31 (×2): 40 mg via ORAL
  Filled 2013-07-30 (×4): qty 1

## 2013-07-30 NOTE — Progress Notes (Signed)
INITIAL NUTRITION ASSESSMENT  DOCUMENTATION CODES Per approved criteria  -Morbid Obesity   INTERVENTION:  Continue Glucerna TID, each supplement provides 220 kcal and 10 grams protein   If nausea/vomiting persist, recommend changing Zofran from PRN to scheduled  Will continue to monitor   NUTRITION DIAGNOSIS: Inadequate oral intake related to acute illness as evidenced by documented PO intake and patient report.   Goal: Patient to meet >/= 90% of estimated nutrition needs  Monitor:  PO intake and diet advancement, weight trends, labs, I/Ox  Reason for Assessment: Malnutrition Screening Tool Risk  53 y.o. female  Admitting Dx: Nausea/Vomiting and Hypoxia  ASSESSMENT: Patient with a past medical history of Diabetes mellitus without complication; PE (pulmonary embolism); Hypercholesterolemia; Hypertension; COPD (chronic obstructive pulmonary disease); Asthma; Coronary artery disease; CHF (congestive heart failure); Mental disorder; Shortness of breath; GERD (gastroesophageal reflux disease); Neuromuscular disorder; Ischemic cardiomyopathy; Bipolar disorder; and Post-traumatic stress syndrome.   Patient was admitted 2/10-2/16 for emesis and was diagnosed with gastroparesis she was discharged on reglan and was supposed to follow up her PCP due to snow she was not able to go and run out of her reglan at that point hr nausea and vomiting resumed. IN ER she was noted to be hypoxic with oxygen saturation down to 89% on RA. Patient denies being on oxygen at home but states she needs it. Currently nausea has improved. Will admit for hypoxia. Patient denies any diarrhea just nausea and vomiting.   Patient reports that she has had minimal intake over the past month due to reoccurring episodes of nausea and vomiting. Patient stated that she does not like any of the foods she is getting on her trays. PO intake has been 0% since admission.       Patient stated that she believes weight loss is  due to fluid loss with vomiting. Patient stated that her usual weight is 230 lb. Per chart history, weight was 313 lb in early February and current weight is 299 lb which is a 4.5% weight loss, not significant.   Nutrition Focused Physical Exam:  Subcutaneous Fat:  Orbital Region: WNL Upper Arm Region: WNL Thoracic and Lumbar Region: WNL  Muscle:  Temple Region: WNL Clavicle Bone Region: WNL Clavicle and Acromion Bone Region: WNL Scapular Bone Region: WNL Dorsal Hand: WNL Patellar Region: WNL Anterior Thigh Region: WNL Posterior Calf Region: WNL  Edema: LLE non-pitting  Height: Ht Readings from Last 1 Encounters:  07/29/13 5\' 7"  (1.702 m)    Weight: Wt Readings from Last 1 Encounters:  07/29/13 299 lb 2.6 oz (135.7 kg)    Ideal Body Weight: 135 lb (61.4 kg)  % Ideal Body Weight: 221%  Wt Readings from Last 10 Encounters:  07/29/13 299 lb 2.6 oz (135.7 kg)  07/16/13 299 lb 6.2 oz (135.8 kg)  07/05/13 313 lb 4.4 oz (142.1 kg)  02/08/13 295 lb (133.811 kg)  11/28/12 296 lb 4.8 oz (134.4 kg)    Usual Body Weight: 230 lb (104.5 kg)  % Usual Body Weight: 130%  BMI:  Body mass index is 46.84 kg/(m^2). Morbid obesity  Estimated Nutritional Needs: Kcal: 1900-2100 Protein: 80-100 grams Fluid: 1.9-2.1 L  Skin: no wounds  Diet Order: Full Liquid  EDUCATION NEEDS: -No education needs identified at this time   Intake/Output Summary (Last 24 hours) at 07/30/13 0901 Last data filed at 07/30/13 0810  Gross per 24 hour  Intake    500 ml  Output    551 ml  Net    -  51 ml    Last BM: PTA  Labs:   Recent Labs Lab 07/28/13 1950 07/29/13 0517 07/30/13 0645  NA 140 140 138  K 4.0 4.0 3.8  CL 98 97 97  CO2 26 29 31   BUN 11 11 10   CREATININE 0.88 0.82 0.72  CALCIUM 9.0 8.9 8.9  MG  --  2.0  --   PHOS  --  3.2  --   GLUCOSE 194* 207* 171*    CBG (last 3)   Recent Labs  07/30/13 0003 07/30/13 0344 07/30/13 0721  GLUCAP 151* 140* 163*     Scheduled Meds: . atorvastatin  10 mg Oral q1800  . carvedilol  25 mg Oral BID WC  . docusate sodium  100 mg Oral BID  . erythromycin ethylsuccinate  400 mg Oral TID  . feeding supplement (GLUCERNA SHAKE)  237 mL Oral TID BM  . FLUoxetine  20 mg Oral Daily  . fluticasone  2 spray Each Nare Daily  . guaiFENesin  600 mg Oral BID  . insulin aspart  0-9 Units Subcutaneous 6 times per day  . insulin glargine  35 Units Subcutaneous QHS  . metoCLOPramide  10 mg Oral TID AC & HS  . pantoprazole  40 mg Oral Daily  . potassium chloride  10 mEq Oral BID  . Rivaroxaban  20 mg Oral Q breakfast  . senna  1 tablet Oral BID  . traZODone  100 mg Oral QHS    Continuous Infusions:   Past Medical History  Diagnosis Date  . Diabetes mellitus without complication   . PE (pulmonary embolism)   . Hypercholesterolemia   . Hypertension   . COPD (chronic obstructive pulmonary disease)   . Asthma   . Coronary artery disease   . CHF (congestive heart failure)   . Mental disorder     BIPOLAR  . Shortness of breath   . GERD (gastroesophageal reflux disease)   . Neuromuscular disorder     DIABETIC NEUROPATHY  . Ischemic cardiomyopathy   . Bipolar disorder   . Post-traumatic stress syndrome     Past Surgical History  Procedure Laterality Date  . Coronary angioplasty with stent placement      CAD in 2006 x 2 and 2009 2 more- place din REx in [place] [facility] [place] [facility]    Claudell Kyle, Dietetic Intern Pager: [phone]  I agree with above documentation.   Mikey College MS, [place], [place] Pager [phone] After Hours Pager

## 2013-07-30 NOTE — Progress Notes (Signed)
No SSI coverage given to pt in spite of orders. Pt has had Lantus Insulin daily x 2 with no significant po intake.; 55Units Saturday pm and 35 Units Sunday pm

## 2013-07-30 NOTE — Progress Notes (Signed)
[facility] PROGRESS NOTE  Tamara Silva [id number] DOB: [birth year] DOA: 07/28/2013 PCP: Philis Fendt, MD  Assessment/Plan  Nausea without vomiting - most likely secondary to gastroparesis.  Declines to even try drinking sips.    -  Will take sips with meds -  No IV access currently -  Encourage small sips frequently overnight -  Continue oral erythromycin  -  Continue zofran and reglan -  Advance diet as tolerated -  Will try to avoid PICC line if possible -  GI consult:  Appreciate assistance!  Essential hypertension, uncontrolled -  continue home medications   Acute hypoxic respiratory failure likely multifactorial due to acute COPD exac, chronic diastolic heart failure, hx of PE, and pulmonary artery stenosis.  Acute COPD (chronic obstructive pulmonary disease) exac with increased cough and clear sputum - Start prednisone taper - Already on abx as above - Walking pulse ox closer to discharge  Chronic diastolic congestive heart failure -  Continue lasix at lower dose  -  Check pro-bnp -  Trend potassium -  Daily weights -  Strict I/O  Hx of pulmonary embolus - continue xarelto  Pulmonary artery narrowing, may be contributing to resp failure  Diabetes mellitus, A1c 8.1 -  Continue lantus at current dose  -  Continue sliding scale insulin  Morbid obesity with probable OHS and/or OSA -  Outpatient sleep study -  Consideration of bariatric surgery  Diet:  CLD Access:  none IVF:  none Proph:  xarelto  Code Status: full Family Communication: patient alone Disposition Plan: pending able to stay hydrated   Consultants:  none  Procedures:  CT angio chest  CXR  Antibiotics:  none   HPI/Subjective:  Still severe nausea without true vomiting.  Spits frequently.    Objective: Filed Vitals:   07/30/13 1205 07/30/13 1350 07/30/13 1616 07/30/13 1833  BP:  145/86  146/76  Pulse:  69  74  Temp:  98 F (36.7 C)  98.8 F (37.1 C)  TempSrc:   Oral  Oral  Resp:  20  18  Height:      Weight:      SpO2: 92% 99% 94% 94%    Intake/Output Summary (Last 24 hours) at 07/30/13 1834 Last data filed at 07/30/13 1350  Gross per 24 hour  Intake    300 ml  Output      4 ml  Net    296 ml   Filed Weights   07/29/13 0115  Weight: 135.7 kg (299 lb 2.6 oz)    Exam:   General:  Obese AAF, No acute distress  HEENT:  NCAT, MMM  Cardiovascular:  RRR, nl S1, S2 no mrg, 2+ pulses, warm extremities  Respiratory:  Diminished bilateral BS, mild wheeze, no focal rales or rhonchi, no increased WOB  Abdomen:   NABS, soft, NT/ND  MSK:   Normal tone and bulk, no LEE  Neuro:  Grossly intact  Data Reviewed: Basic Metabolic Panel:  Recent Labs Lab 07/28/13 1950 07/29/13 0517 07/30/13 0645  NA 140 140 138  K 4.0 4.0 3.8  CL 98 97 97  CO2 26 29 31   GLUCOSE 194* 207* 171*  BUN 11 11 10   CREATININE 0.88 0.82 0.72  CALCIUM 9.0 8.9 8.9  MG  --  2.0  --   PHOS  --  3.2  --    Liver Function Tests:  Recent Labs Lab 07/28/13 1950 07/29/13 0517  AST 14 13  ALT 15 15  ALKPHOS 63  36  BILITOT <0.2* 0.2*  PROT 7.8 7.3  ALBUMIN 3.4* 3.3*    Recent Labs Lab 07/28/13 1950  LIPASE 15   No results found for this basename: AMMONIA,  in the last 168 hours CBC:  Recent Labs Lab 07/28/13 1907 07/29/13 0517 07/30/13 0645  WBC 8.4 8.7 9.5  NEUTROABS 5.8  --   --   HGB 12.3 11.0* 11.2*  HCT 37.9 35.4* 36.4  MCV 81.3 82.3 83.1  PLT 325 284 287   Cardiac Enzymes:  Recent Labs Lab 07/28/13 1950  TROPONINI <0.30   BNP (last 3 results)  Recent Labs  06/24/13 1838 06/29/13 1430 07/01/13 0235  PROBNP 256.4* 770.4* 1194.0*   CBG:  Recent Labs Lab 07/30/13 0003 07/30/13 0344 07/30/13 0721 07/30/13 1140 07/30/13 1552  GLUCAP 151* 140* 163* 167* 161*    No results found for this or any previous visit (from the past 240 hour(s)).   Studies: Ct Angio Chest Pe W/cm &/or Wo Cm  07/28/2013   CLINICAL DATA:   Hypoxia, prior PE.  EXAM: CT ANGIOGRAPHY CHEST WITH CONTRAST  TECHNIQUE: Multidetector CT imaging of the chest was performed using the standard protocol during bolus administration of intravenous contrast. Multiplanar CT image reconstructions and MIPs were obtained to evaluate the vascular anatomy.  CONTRAST:  151mL OMNIPAQUE IOHEXOL 350 MG/ML SOLN  COMPARISON:  CTA chest dated 01/23/2013  FINDINGS: Narrowing/occlusion of the right lower lobe pulmonary artery (series 10/image 133), favored to reflect sequela of prior/chronic pulmonary embolism. No findings to suggest acute pulmonary embolism.  Left upper lobe scarring with overlying calcified pleural plaques. Left lower lobe platelike scarring. Trace left pleural effusion. Mild right lower lobe atelectasis/scarring. Moderate paraseptal emphysematous changes. No pneumothorax.  Visualized thyroid is unremarkable.  Heart is mildly enlarged. Thinning involving the left ventricular apex, likely the sequela of prior MI. No pericardial effusion. Coronary atherosclerosis with percutaneous stent.  Small mediastinal lymph nodes, including a 10 mm Mariaguadalupe Fialkowski axis high right paratracheal node (series 4/image 17) and a 12 mm subcarinal node (series 4/image 39). No suspicious hilar or axillary lymphadenopathy.  Visualized upper abdomen is unremarkable.  Visualized osseous structures are within normal limits.  Review of the MIP images confirms the above findings.  IMPRESSION: Narrowing/occlusion of the right lower lobe pulmonary artery, favored to reflect sequela of prior/chronic pulmonary embolism.  No findings to suggest acute pulmonary embolism.  Additional stable ancillary findings as above.   Electronically Signed   By: Julian Hy M.D.   On: 07/28/2013 23:03    Scheduled Meds: . atorvastatin  10 mg Oral q1800  . carvedilol  25 mg Oral BID WC  . docusate sodium  100 mg Oral BID  . erythromycin ethylsuccinate  400 mg Oral TID  . feeding supplement (GLUCERNA SHAKE)   237 mL Oral TID BM  . FLUoxetine  20 mg Oral Daily  . fluticasone  2 spray Each Nare Daily  . furosemide  40 mg Oral BID  . guaiFENesin  600 mg Oral BID  . insulin aspart  0-9 Units Subcutaneous 6 times per day  . insulin glargine  35 Units Subcutaneous QHS  . ipratropium-albuterol  3 mL Nebulization TID  . metoCLOPramide  10 mg Oral TID AC & HS  . pantoprazole  40 mg Oral Daily  . potassium chloride  10 mEq Oral BID  . predniSONE  40 mg Oral Q breakfast  . Rivaroxaban  20 mg Oral Q breakfast  . senna  1 tablet Oral BID  .  traZODone  100 mg Oral QHS   Continuous Infusions:   Active Problems:   Essential hypertension   COPD (chronic obstructive pulmonary disease)   Intractable nausea and vomiting   Chronic diastolic congestive heart failure   Nausea & vomiting   Gastroparesis   Hx of pulmonary embolus   Diabetes mellitus   Hypoxia    Time spent: 30 min    Lashannon Bresnan, [place] [facility] Pager [phone]. If 7PM-7AM, please contact night-coverage at [email or web address], password [facility] 07/30/2013, 6:34 PM  LOS: 2 days

## 2013-07-30 NOTE — Consult Note (Signed)
Referring Provider: Dr. Sheran Fava Primary Care Physician:  Philis Fendt, MD Primary Gastroenterologist:  Althia Forts  Reason for Consultation:  Nausea and Vomiting; Gastroparesis  HPI: Tamara Silva is a 53 y.o. female who was diagnosed with gastroparesis 2 weeks ago and seen at that time in consultation by Dr. Collene Mares returns with admit due to worsening N/V. She reportedly had been doing well on PO Reglan at home and ran out and her symptoms returned. Gastric emptying scan last month showed 53% retention at 2 hours with normal being less than 30%. Denies abdominal pain. Reports feeling very sick on her stomach and no appetite. States that the sight of food would make her very sick. Nurse present during my evaluation and patient tolerating sips of clears and keeping pills down. Denies melena. Reports constipation at home that is new for her. Reports good glycemic control at home prior to recurrence of N/V (HgbA1C 8.1%). On Xarelto due to PE.  Past Medical History  Diagnosis Date  . Diabetes mellitus without complication   . PE (pulmonary embolism)   . Hypercholesterolemia   . Hypertension   . COPD (chronic obstructive pulmonary disease)   . Asthma   . Coronary artery disease   . CHF (congestive heart failure)   . Mental disorder     BIPOLAR  . Shortness of breath   . GERD (gastroesophageal reflux disease)   . Neuromuscular disorder     DIABETIC NEUROPATHY  . Ischemic cardiomyopathy   . Bipolar disorder   . Post-traumatic stress syndrome     Past Surgical History  Procedure Laterality Date  . Coronary angioplasty with stent placement      CAD in 2006 x 2 and 2009 2 more- place din REx in [place] and [place] med    Prior to Admission medications   Medication Sig Start Date End Date Taking? Authorizing Provider  albuterol (PROVENTIL HFA;VENTOLIN HFA) 108 (90 BASE) MCG/ACT inhaler Inhale 2 puffs into the lungs every 6 (six) hours as needed for wheezing. 06/29/13  Yes Kathalene Frames, MD   albuterol (PROVENTIL) (2.5 MG/3ML) 0.083% nebulizer solution Take 6 mLs (5 mg total) by nebulization every 4 (four) hours as needed for wheezing or shortness of breath. 06/24/13  Yes Jennifer L Piepenbrink, PA-C  carvedilol (COREG) 25 MG tablet Take 1 tablet (25 mg total) by mouth 2 (two) times daily with a meal. 06/29/13  Yes Kathalene Frames, MD  diazepam (VALIUM) 10 MG tablet Take 0.5 tablets (5 mg total) by mouth every 8 (eight) hours as needed for anxiety. 07/16/13  Yes Allie Bossier, MD  FLUoxetine (PROZAC) 20 MG capsule Take 1 capsule (20 mg total) by mouth daily. 07/16/13  Yes Allie Bossier, MD  fluticasone [facility]) 50 MCG/ACT nasal spray Place 2 sprays into the nose every morning.    Yes Historical Provider, MD  furosemide (LASIX) 40 MG tablet Take 40-80 mg by mouth 3 (three) times daily. Take 2 tabs in the morning. 1 tab at noon. 2 tabs in the evening.   Yes Historical Provider, MD  insulin glargine (LANTUS) 100 UNIT/ML injection Inject 55 Units into the skin at bedtime. 11/28/12  Yes Nita Sells, MD  insulin lispro (HUMALOG) 100 UNIT/ML injection Inject 0-20 Units into the skin 3 (three) times daily before meals. SSI based on BG   Yes Historical Provider, MD  ipratropium-albuterol (DUONEB) 0.5-2.5 (3) MG/3ML SOLN Take 3 mLs by nebulization every 6 (six) hours as needed (wheezing). 06/29/13  Yes Kathalene Frames, MD  metoCLOPramide (REGLAN) 10 MG tablet Take 1 tablet (10 mg total) by mouth 4 (four) times daily -  before meals and at bedtime. 07/16/13  Yes Allie Bossier, MD  nitroGLYCERIN (NITROSTAT) 0.4 MG SL tablet Place 1 tablet (0.4 mg total) under the tongue every 5 (five) minutes as needed for chest pain. 02/08/13  Yes Lorretta Harp, MD  omeprazole (PRILOSEC) 40 MG capsule Take 40 mg by mouth 2 (two) times daily.   Yes Historical Provider, MD  ondansetron (ZOFRAN) 4 MG tablet Take 1 tablet (4 mg total) by mouth 4 (four) times daily as needed for nausea or vomiting. 07/16/13  Yes Allie Bossier, MD  potassium chloride (K-DUR) 10 MEQ tablet Take 10 mEq by mouth 2 (two) times daily.  10/21/12 10/21/13 Yes Historical Provider, MD  pravastatin (PRAVACHOL) 40 MG tablet Take 40 mg by mouth every evening.    Yes Historical Provider, MD  Rivaroxaban (XARELTO) 20 MG TABS Take 1 tablet (20 mg total) by mouth daily with supper. 11/28/12  Yes Nita Sells, MD  tiZANidine (ZANAFLEX) 4 MG tablet Take 0.5 tablets (2 mg total) by mouth every 6 (six) hours as needed for muscle spasms. 07/16/13  Yes Allie Bossier, MD  traZODone (DESYREL) 100 MG tablet Take 100 mg by mouth at bedtime.   Yes Historical Provider, MD    Scheduled Meds: . atorvastatin  10 mg Oral q1800  . carvedilol  25 mg Oral BID WC  . docusate sodium  100 mg Oral BID  . erythromycin ethylsuccinate  400 mg Oral TID  . feeding supplement (GLUCERNA SHAKE)  237 mL Oral TID BM  . FLUoxetine  20 mg Oral Daily  . fluticasone  2 spray Each Nare Daily  . furosemide  40 mg Oral BID  . guaiFENesin  600 mg Oral BID  . insulin aspart  0-9 Units Subcutaneous 6 times per day  . insulin glargine  35 Units Subcutaneous QHS  . ipratropium-albuterol  3 mL Nebulization TID  . metoCLOPramide  10 mg Oral TID AC & HS  . pantoprazole  40 mg Oral Daily  . potassium chloride  10 mEq Oral BID  . predniSONE  40 mg Oral Q breakfast  . Rivaroxaban  20 mg Oral Q breakfast  . senna  1 tablet Oral BID  . traZODone  100 mg Oral QHS   Continuous Infusions:  PRN Meds:.acetaminophen, acetaminophen, albuterol, bisacodyl, diazepam, HYDROcodone-acetaminophen, ondansetron (ZOFRAN) IV, ondansetron, polyethylene glycol, tiZANidine  Allergies as of 07/28/2013 - Review Complete 07/28/2013  Allergen Reaction Noted  . Nsaids Other (See Comments) 11/27/2012  . Metolazone Nausea And Vomiting 07/10/2013    Family History  Problem Relation Age of Onset  . Stroke Mother   . Cancer Father     History   Social History  . Marital Status: Single    Spouse  Name: N/A    Number of Children: N/A  . Years of Education: N/A   Occupational History  . Not on file.   Social History Main Topics  . Smoking status: Former Smoker    Types: Cigarettes    Quit date: 11/24/2012  . Smokeless tobacco: Never Used  . Alcohol Use: No  . Drug Use: No  . Sexual Activity: Not on file   Other Topics Concern  . Not on file   Social History Narrative   Origibnally from [place]   Most recently from [place]   Daughter lives in town      On  Social security to CHF, COPD, CAD    Review of Systems: All negative except as stated above in HPI.  Physical Exam: Vital signs: Filed Vitals:   07/30/13 1350  BP: 145/86  Pulse: 69  Temp: 98 F (36.7 C)  Resp: 20     General:   Lethargic, morbidly obese, no acute distress  Lungs:  Mild expiratory wheezing; Coarse breath sounds Heart:  Regular rate and rhythm; no murmurs, clicks, rubs,  or gallops. Abdomen: minimal periumbilical tenderness without guarding, soft, nontender, nondistended, +BS Rectal:  Deferred  GI:  Lab Results:  Recent Labs  07/28/13 1907 07/29/13 0517 07/30/13 0645  WBC 8.4 8.7 9.5  HGB 12.3 11.0* 11.2*  HCT 37.9 35.4* 36.4  PLT 325 284 287   BMET  Recent Labs  07/28/13 1950 07/29/13 0517 07/30/13 0645  NA 140 140 138  K 4.0 4.0 3.8  CL 98 97 97  CO2 26 29 31   GLUCOSE 194* 207* 171*  BUN 11 11 10   CREATININE 0.88 0.82 0.72  CALCIUM 9.0 8.9 8.9   LFT  Recent Labs  07/29/13 0517  PROT 7.3  ALBUMIN 3.3*  AST 13  ALT 15  ALKPHOS 56  BILITOT 0.2*   PT/INR No results found for this basename: LABPROT, INR,  in the last 72 hours   Studies/Results: Dg Chest 2 View  07/28/2013   CLINICAL DATA:  Chest pain  EXAM: CHEST  2 VIEW  COMPARISON:  06/29/2013  FINDINGS: Cardiomediastinal silhouette is stable. Stable scarring in left upper lobe. Chronic interstitial prominence and chronic mild bronchitic changes. No acute infiltrate or pulmonary edema. Bony thorax is  stable.  IMPRESSION: No acute infiltrate or pulmonary edema. Stable chronic interstitial prominence and mild bronchitic changes. Again noted scarring in left upper lobe.   Electronically Signed   By: Lahoma Crocker M.D.   On: 07/28/2013 17:26   Ct Angio Chest Pe W/cm &/or Wo Cm  07/28/2013   CLINICAL DATA:  Hypoxia, prior PE.  EXAM: CT ANGIOGRAPHY CHEST WITH CONTRAST  TECHNIQUE: Multidetector CT imaging of the chest was performed using the standard protocol during bolus administration of intravenous contrast. Multiplanar CT image reconstructions and MIPs were obtained to evaluate the vascular anatomy.  CONTRAST:  170mL OMNIPAQUE IOHEXOL 350 MG/ML SOLN  COMPARISON:  CTA chest dated 01/23/2013  FINDINGS: Narrowing/occlusion of the right lower lobe pulmonary artery (series 10/image 133), favored to reflect sequela of prior/chronic pulmonary embolism. No findings to suggest acute pulmonary embolism.  Left upper lobe scarring with overlying calcified pleural plaques. Left lower lobe platelike scarring. Trace left pleural effusion. Mild right lower lobe atelectasis/scarring. Moderate paraseptal emphysematous changes. No pneumothorax.  Visualized thyroid is unremarkable.  Heart is mildly enlarged. Thinning involving the left ventricular apex, likely the sequela of prior MI. No pericardial effusion. Coronary atherosclerosis with percutaneous stent.  Small mediastinal lymph nodes, including a 10 mm short axis high right paratracheal node (series 4/image 17) and a 12 mm subcarinal node (series 4/image 39). No suspicious hilar or axillary lymphadenopathy.  Visualized upper abdomen is unremarkable.  Visualized osseous structures are within normal limits.  Review of the MIP images confirms the above findings.  IMPRESSION: Narrowing/occlusion of the right lower lobe pulmonary artery, favored to reflect sequela of prior/chronic pulmonary embolism.  No findings to suggest acute pulmonary embolism.  Additional stable ancillary  findings as above.   Electronically Signed   By: Julian Hy M.D.   On: 07/28/2013 23:03    Impression/Plan: 53 yo with  nausea and vomiting likely due to diabetic gastroparesis. Would continue Reglan 10 mg PO QID. Continue E-mycin for now. Supportive care. Continue clear liquid diet. I do not think an endoscopy is needed at this time. If her vomiting persists despite Reglan, then may need to reconsider an EGD. Will follow.    LOS: 2 days   [place] C.  07/30/2013, 4:29 PM

## 2013-07-30 NOTE — Progress Notes (Signed)
Clinical Social Work Department BRIEF PSYCHOSOCIAL ASSESSMENT 07/30/2013  Patient:  [facility]     Account Number:  [id number]     Admit date:  07/28/2013  Clinical Social Worker:  Lacie Scotts  Date/Time:  07/30/2013 04:57 PM  Referred by:  Physician  Date Referred:  07/30/2013 Referred for  SNF Placement   Other Referral:   Interview type:  Patient Other interview type:    PSYCHOSOCIAL DATA Living Status:  FAMILY Admitted from facility:   Level of care:   Primary support name:  Hazle Quant Primary support relationship to patient:  CHILD, ADULT Degree of support available:   unclear    CURRENT CONCERNS Current Concerns  Financial Resources   Other Concerns:   Transportation issues    SOCIAL WORK ASSESSMENT / PLAN Pt is a 53 yr old female originally from [place] hospitalized on 2/28. CSW was consulted to assist with psychosocial needs. Pt complains of having difficulty getting to her MD appts. Pt states she has a SCAT application at home but needs her PCP to sign forms and she hasn't been able to get to his office. CSW will provide bus passes to assist pt with transportation to MD. Pt also complains of running out of food before next SSI check is available. CSW has provided pt with resources to local food pantries.   Assessment/plan status:  No Further Intervention Required Other assessment/ plan:   Information/referral to community resources:   Librarian, academic provided.    PATIENT'S/FAMILY'S RESPONSE TO PLAN OF CARE: Pt was sleepy when CSW saw her this am but was still agreeable to speak with CSW. Pt accepted new SCAT application and resources provided. Bus passes will be provided at d/c. Pt appreciated assistance CSW provided.   Werner Lean LCSW [phone]

## 2013-07-30 NOTE — Clinical Documentation Improvement (Signed)
Possible Clinical Conditions?  Acute Respiratory Failure Acute on Chronic Respiratory Failure Chronic Respiratory Failure Other Condition Cannot Clinically Determine   Supporting Information: Risk Factors:(as per notes)  Pt has Acute CHF, Morbid Obesity, DM, and COPD. Signs & Symptoms(as per notes) : "SOB", "hypoxic with oxygen saturation down to 89% on RA" Diagnostics: O2 Saturation 89% on 07-28-13 Treatment: O2 @ 2L/M VIA [place]  MEDICATIONS: furosemide (LASIX) 40 MG tablet [id number]   Order Details    Dose: 40-80 mg Route: Oral Frequency: 3 times daily     fluticasone (FLONASE) 50 MCG/ACT nasal spray [id number]   Order Details    Dose: 2 spray Route: Nasal Frequency:  Every morning - 10a          Thank You, Alessandra Grout, RN, BSN, CCDS Clinical Documentation Specialist:  [phone]   [phone] [facility]

## 2013-07-31 ENCOUNTER — Inpatient Hospital Stay (HOSPITAL_COMMUNITY): Payer: Medicaid Other

## 2013-07-31 DIAGNOSIS — J441 Chronic obstructive pulmonary disease with (acute) exacerbation: Secondary | ICD-10-CM

## 2013-07-31 DIAGNOSIS — J9600 Acute respiratory failure, unspecified whether with hypoxia or hypercapnia: Secondary | ICD-10-CM

## 2013-07-31 LAB — BASIC METABOLIC PANEL
BUN: 10 mg/dL (ref 6–23)
CO2: 30 mEq/L (ref 19–32)
CREATININE: 0.77 mg/dL (ref 0.50–1.10)
Calcium: 8.9 mg/dL (ref 8.4–10.5)
Chloride: 98 mEq/L (ref 96–112)
GFR calc non Af Amer: >90 mL/min (ref >90)
Glucose, Bld: 131 mg/dL — ABNORMAL HIGH (ref 70–99)
Potassium: 3.6 mEq/L — ABNORMAL LOW (ref 3.7–5.3)
Sodium: 139 mEq/L (ref 137–147)

## 2013-07-31 LAB — GLUCOSE, CAPILLARY
GLUCOSE-CAPILLARY: 124 mg/dL — AB (ref 70–99)
Glucose-Capillary: 149 mg/dL — ABNORMAL HIGH (ref 70–99)
Glucose-Capillary: 128 mg/dL — ABNORMAL HIGH (ref 70–99)
Glucose-Capillary: 158 mg/dL — ABNORMAL HIGH (ref 70–99)
Glucose-Capillary: 178 mg/dL — ABNORMAL HIGH (ref 70–99)

## 2013-07-31 LAB — DRUGS OF ABUSE SCREEN W/O ALC, ROUTINE URINE
Amphetamine Screen, Ur: NEGATIVE
BARBITURATE QUANT UR: NEGATIVE
BENZODIAZEPINES.: POSITIVE — AB
Cocaine Metabolites: NEGATIVE
Creatinine,U: 205.6 mg/dL
MARIJUANA METABOLITE: NEGATIVE
METHADONE: NEGATIVE
OPIATE SCREEN, URINE: NEGATIVE
Phencyclidine (PCP): NEGATIVE
Propoxyphene: NEGATIVE

## 2013-07-31 LAB — TROPONIN I
Troponin I: <0.3 ng/mL (ref <0.30)
Troponin I: <0.3 ng/mL (ref <0.30)

## 2013-07-31 LAB — PRO B NATRIURETIC PEPTIDE: PRO B NATRI PEPTIDE: 1233 pg/mL — AB (ref 0–125)

## 2013-07-31 MED ORDER — LISINOPRIL 5 MG PO TABS
5.0000 mg | ORAL_TABLET | Freq: Every day | ORAL | Status: DC
  Administered 2013-07-31 – 2013-08-04 (×5): 5 mg via ORAL
  Filled 2013-07-31 (×7): qty 1

## 2013-07-31 MED ORDER — NITROGLYCERIN 0.4 MG SL SUBL: 0.4000 mg | SUBLINGUAL_TABLET | SUBLINGUAL | Status: DC | PRN

## 2013-07-31 MED ORDER — POTASSIUM CHLORIDE CRYS ER 10 MEQ PO TBCR
10.0000 meq | EXTENDED_RELEASE_TABLET | Freq: Once | ORAL | Status: AC
  Administered 2013-07-31: 10 meq via ORAL
  Filled 2013-07-31: qty 1

## 2013-07-31 MED ORDER — FUROSEMIDE 80 MG PO TABS
80.0000 mg | ORAL_TABLET | Freq: Two times a day (BID) | ORAL | Status: DC
  Administered 2013-07-31 – 2013-08-02 (×4): 80 mg via ORAL
  Filled 2013-07-31 (×6): qty 1

## 2013-07-31 MED ORDER — CLOPIDOGREL BISULFATE 75 MG PO TABS
75.0000 mg | ORAL_TABLET | Freq: Once | ORAL | Status: DC
  Filled 2013-07-31: qty 1

## 2013-07-31 MED ORDER — POLYETHYLENE GLYCOL 3350 17 G PO PACK
17.0000 g | PACK | Freq: Two times a day (BID) | ORAL | Status: DC
  Administered 2013-07-31 – 2013-08-01 (×3): 17 g via ORAL
  Filled 2013-07-31 (×3): qty 1

## 2013-07-31 NOTE — Progress Notes (Addendum)
[facility] PROGRESS NOTE  Dayan Clegg [id number] DOB: Mar 01, 1961 DOA: 07/28/2013 PCP: Philis Fendt, MD  Assessment/Plan   Atypical chest pain, may be related to dyspnea, MSK strain, ACS possible given age and comorbidities -  ECG:  NSR, subtle ST segment < 42mm elevations in V2-V5, which appear similar to prior, delayed R-wave progression -  CXR:  Appears similar to prior, fluid in fissure -  Troponin -  ASA allergy, on xarelto -  Telemetry -  NTG prn  Constipation, worsening, hx of colon cancer -  Possible colonoscopy later this week -  TSH 0.708 -  Continue stool softeners  Nausea without vomiting - most likely secondary to gastroparesis.  Drinking sips today.    -  Encourage small sips frequently overnight -  Continue oral erythromycin  -  Continue zofran and reglan -  Advance diet as tolerated -  GI consult:  Appreciate assistance!  Essential hypertension, uncontrolled -  continue carvedilol -  Start lisinopril  Acute hypoxic respiratory failure likely multifactorial due to acute COPD exac, chronic diastolic heart failure, hx of PE, and pulmonary artery stenosis.  Acute COPD (chronic obstructive pulmonary disease) exac with increased cough and clear sputum - Continue prednisone day 2 - Already on abx as above - Walking pulse ox closer to discharge  Acute on chronic diastolic congestive heart failure -  Continue lasix and transition to IV once IV access obtained, diuresis may improve gastric emptying also -  pro-bnp approximately stable from prior: 1233 -  Trend potassium -  Daily weights -  Strict I/O   Hx of pulmonary embolus - continue xarelto  Pulmonary artery narrowing, may be contributing to resp failure  Diabetes mellitus, A1c 8.1, CBG well controlled -  Continue lantus at current dose  -  Continue sliding scale insulin  Morbid obesity with probable OHS and/or OSA -  Outpatient sleep study -  Consideration of bariatric  surgery  Normocytic anemia  -  Occult stool -  Iron studies, B12, folate  Diet:  CLD Access:  none IVF:  none Proph:  xarelto  Code Status: full Family Communication: patient alone Disposition Plan: pending able to stay hydrated   Consultants:  none  Procedures:  CT angio chest  CXR  Antibiotics:  none   HPI/Subjective:  Nausea with spit-ups still, but a little improved.  Taking sips more readily.  Substernal sharp chest pain this AM without radiation. + SOB, baseline nausea.  Took NTG almost immediately with relief of symptoms.  Denies chest pressure, diaphoresis  Objective: Filed Vitals:   07/30/13 2039 07/31/13 0112 07/31/13 0510 07/31/13 0750  BP:  164/90 143/83   Pulse:  74 68   Temp:  98.4 F (36.9 C) 98.2 F (36.8 C)   TempSrc:  Oral Oral   Resp:  18 18   Height:      Weight:      SpO2: 94% 94% 95% 97%    Intake/Output Summary (Last 24 hours) at 07/31/13 1116 Last data filed at 07/31/13 0330  Gross per 24 hour  Intake    240 ml  Output      4 ml  Net    236 ml   Filed Weights   07/29/13 0115  Weight: 135.7 kg (299 lb 2.6 oz)    Exam:   General:  Obese AAF, No acute distress  HEENT:  NCAT, MMM  Cardiovascular:  RRR, nl S1, S2 no mrg, 2+ pulses, warm extremities  Respiratory:  Distant bilateral BS posteriorly,  anterior sounds clear, no focal rales or rhonchi, no increased WOB  Abdomen:   NABS, soft, NT/ND  MSK:   Normal tone and bulk, no LEE  Neuro:  Grossly intact  Data Reviewed: Basic Metabolic Panel:  Recent Labs Lab 07/28/13 1950 07/29/13 0517 07/30/13 0645 07/31/13 0443  NA 140 140 138 139  K 4.0 4.0 3.8 3.6*  CL 98 97 97 98  CO2 26 29 31 30   GLUCOSE 194* 207* 171* 131*  BUN 11 11 10 10   CREATININE 0.88 0.82 0.72 0.77  CALCIUM 9.0 8.9 8.9 8.9  MG  --  2.0  --   --   PHOS  --  3.2  --   --    Liver Function Tests:  Recent Labs Lab 07/28/13 1950 07/29/13 0517  AST 14 13  ALT 15 15  ALKPHOS 63 56   BILITOT <0.2* 0.2*  PROT 7.8 7.3  ALBUMIN 3.4* 3.3*    Recent Labs Lab 07/28/13 1950  LIPASE 15   No results found for this basename: AMMONIA,  in the last 168 hours CBC:  Recent Labs Lab 07/28/13 1907 07/29/13 0517 07/30/13 0645  WBC 8.4 8.7 9.5  NEUTROABS 5.8  --   --   HGB 12.3 11.0* 11.2*  HCT 37.9 35.4* 36.4  MCV 81.3 82.3 83.1  PLT 325 284 287   Cardiac Enzymes:  Recent Labs Lab 07/28/13 1950  TROPONINI <0.30   BNP (last 3 results)  Recent Labs  06/29/13 1430 07/01/13 0235 07/31/13 0443  PROBNP 770.4* 1194.0* 1233.0*   CBG:  Recent Labs Lab 07/30/13 1140 07/30/13 1552 07/30/13 2035 07/31/13 0111 07/31/13 0509  GLUCAP 167* 161* 186* 149* 124*    No results found for this or any previous visit (from the past 240 hour(s)).   Studies: No results found.  Scheduled Meds: . atorvastatin  10 mg Oral q1800  . carvedilol  25 mg Oral BID WC  . clopidogrel  75 mg Oral Once  . docusate sodium  100 mg Oral BID  . erythromycin ethylsuccinate  400 mg Oral TID  . feeding supplement (GLUCERNA SHAKE)  237 mL Oral TID BM  . FLUoxetine  20 mg Oral Daily  . fluticasone  2 spray Each Nare Daily  . furosemide  40 mg Oral BID  . guaiFENesin  600 mg Oral BID  . insulin aspart  0-9 Units Subcutaneous TID WC  . insulin glargine  35 Units Subcutaneous QHS  . ipratropium-albuterol  3 mL Nebulization TID  . metoCLOPramide  10 mg Oral TID AC & HS  . pantoprazole  40 mg Oral Daily  . polyethylene glycol  17 g Oral BID  . potassium chloride  10 mEq Oral BID  . predniSONE  40 mg Oral Q breakfast  . Rivaroxaban  20 mg Oral Q breakfast  . senna  1 tablet Oral BID  . traZODone  100 mg Oral QHS   Continuous Infusions:   Active Problems:   Essential hypertension   COPD (chronic obstructive pulmonary disease)   Intractable nausea and vomiting   Chronic diastolic congestive heart failure   Nausea & vomiting   Gastroparesis   Hx of pulmonary embolus   Diabetes  mellitus   Hypoxia   Acute respiratory failure with hypoxia   COPD with acute exacerbation   Morbid obesity    Time spent: 30 min    Brytnee Bechler, [facility] Pager [phone]. If 7PM-7AM, please contact night-coverage at [email or web address], password [facility] 07/31/2013, 11:16  AM  LOS: 3 days

## 2013-07-31 NOTE — Progress Notes (Signed)
Tamara Silva 10:49 AM  Subjective: Patient doing a little better with her nausea and vomit but still spitting up a little however she complains of increased constipation and requests an enema and has been using them with increased frequency at home and has a strong family history of colon cancer and no previous colonic screening  Objective: Vital signs stable afebrile no acute distress abdomen is soft nontender  Assessment: Multiple medical problems  Plan: Will plan on a colonoscopy based on the schedule on Friday and an endoscopy too to rule out other causes of her gastroparesis and will try some MiraLax and enema in the meantime  [facility] E

## 2013-08-01 ENCOUNTER — Ambulatory Visit: Payer: Self-pay

## 2013-08-01 DIAGNOSIS — I10 Essential (primary) hypertension: Secondary | ICD-10-CM

## 2013-08-01 LAB — BASIC METABOLIC PANEL
BUN: 15 mg/dL (ref 6–23)
CO2: 31 mEq/L (ref 19–32)
Calcium: 8.7 mg/dL (ref 8.4–10.5)
Chloride: 94 mEq/L — ABNORMAL LOW (ref 96–112)
Creatinine, Ser: 0.85 mg/dL (ref 0.50–1.10)
GFR calc Af Amer: 89 mL/min — ABNORMAL LOW (ref >90)
GFR calc non Af Amer: 77 mL/min — ABNORMAL LOW (ref >90)
GLUCOSE: 217 mg/dL — AB (ref 70–99)
POTASSIUM: 3.4 meq/L — AB (ref 3.7–5.3)
SODIUM: 136 meq/L — AB (ref 137–147)

## 2013-08-01 LAB — BENZODIAZEPINE, QUANTITATIVE, URINE
Alprazolam (GC/LC/MS), ur confirm: NEGATIVE ng/mL
Alprazolam metabolite (GC/LC/MS), ur confirm: NEGATIVE ng/mL
Clonazepam metabolite (GC/LC/MS), ur confirm: NEGATIVE ng/mL
Diazepam (GC/LC/MS), ur confirm: NEGATIVE ng/mL
ESTAZOLAMU: NEGATIVE ng/mL
Flunitrazepam metabolite (GC/LC/MS), ur confirm: NEGATIVE ng/mL
Flurazepam GC/MS Conf: NEGATIVE ng/mL
LORAZEPAMU: NEGATIVE ng/mL
MIDAZOLAMU: NEGATIVE ng/mL
NORDIAZEPAM GC/MS CONF: 1084 ng/mL
Oxazepam GC/MS Conf: 3035 ng/mL
TEMAZEPAM GC/MS CONF: 2289 ng/mL
Triazolam metabolite (GC/LC/MS), ur confirm: NEGATIVE ng/mL

## 2013-08-01 LAB — CBC
HCT: 35.8 % — ABNORMAL LOW (ref 36.0–46.0)
Hemoglobin: 11 g/dL — ABNORMAL LOW (ref 12.0–15.0)
MCH: 24.9 pg — AB (ref 26.0–34.0)
MCHC: 30.7 g/dL (ref 30.0–36.0)
MCV: 81.2 fL (ref 78.0–100.0)
Platelets: 283 10*3/uL (ref 150–400)
RBC: 4.41 MIL/uL (ref 3.87–5.11)
RDW: 16.4 % — ABNORMAL HIGH (ref 11.5–15.5)
WBC: 10.4 10*3/uL (ref 4.0–10.5)

## 2013-08-01 LAB — TROPONIN I: Troponin I: <0.3 ng/mL (ref <0.30)

## 2013-08-01 LAB — VITAMIN B12: Vitamin B-12: 997 pg/mL — ABNORMAL HIGH (ref 211–911)

## 2013-08-01 LAB — GLUCOSE, CAPILLARY
GLUCOSE-CAPILLARY: 176 mg/dL — AB (ref 70–99)
GLUCOSE-CAPILLARY: 121 mg/dL — AB (ref 70–99)
GLUCOSE-CAPILLARY: 175 mg/dL — AB (ref 70–99)
GLUCOSE-CAPILLARY: 193 mg/dL — AB (ref 70–99)
Glucose-Capillary: 191 mg/dL — ABNORMAL HIGH (ref 70–99)
Glucose-Capillary: 238 mg/dL — ABNORMAL HIGH (ref 70–99)
Glucose-Capillary: 195 mg/dL — ABNORMAL HIGH (ref 70–99)

## 2013-08-01 LAB — IRON AND TIBC
Iron: 37 ug/dL — ABNORMAL LOW (ref 42–135)
SATURATION RATIOS: 10 % — AB (ref 20–55)
TIBC: 355 ug/dL (ref 250–470)
UIBC: 318 ug/dL (ref 125–400)

## 2013-08-01 LAB — FOLATE RBC: RBC FOLATE: 941 ng/mL — AB (ref >280)

## 2013-08-01 LAB — FERRITIN: Ferritin: 20 ng/mL (ref 10–291)

## 2013-08-01 LAB — TRANSFERRIN: TRANSFERRIN: 275 mg/dL (ref 200–360)

## 2013-08-01 MED ORDER — METOCLOPRAMIDE HCL 10 MG PO TABS: 10.0000 mg | ORAL_TABLET | Freq: Three times a day (TID) | ORAL | Status: DC

## 2013-08-01 MED ORDER — ALBUTEROL SULFATE (2.5 MG/3ML) 0.083% IN NEBU: 5.0000 mg | INHALATION_SOLUTION | Freq: Four times a day (QID) | RESPIRATORY_TRACT | Status: DC | PRN

## 2013-08-01 MED ORDER — METOCLOPRAMIDE HCL 10 MG PO TABS
10.0000 mg | ORAL_TABLET | Freq: Three times a day (TID) | ORAL | Status: DC
  Administered 2013-08-03 – 2013-08-04 (×6): 10 mg via ORAL
  Filled 2013-08-01 (×9): qty 1

## 2013-08-01 MED ORDER — METOCLOPRAMIDE HCL 5 MG/ML IJ SOLN
10.0000 mg | Freq: Three times a day (TID) | INTRAMUSCULAR | Status: AC
  Administered 2013-08-01 – 2013-08-02 (×7): 10 mg via INTRAVENOUS
  Filled 2013-08-01 (×8): qty 2

## 2013-08-01 MED ORDER — POLYETHYLENE GLYCOL 3350 17 GM/SCOOP PO POWD
1.0000 | Freq: Once | ORAL | Status: AC
  Administered 2013-08-01: 1 via ORAL
  Filled 2013-08-01: qty 255

## 2013-08-01 MED ORDER — SODIUM CHLORIDE 0.9 % IV SOLN
INTRAVENOUS | Status: DC
  Administered 2013-08-01: 1000 mL via INTRAVENOUS

## 2013-08-01 MED ORDER — SODIUM CHLORIDE 0.9 % IJ SOLN: 10.0000 mL | INTRAMUSCULAR | Status: DC | PRN

## 2013-08-01 MED ORDER — POTASSIUM CHLORIDE ER 10 MEQ PO TBCR
20.0000 meq | EXTENDED_RELEASE_TABLET | Freq: Two times a day (BID) | ORAL | Status: DC
  Administered 2013-08-01 – 2013-08-02 (×2): 20 meq via ORAL
  Filled 2013-08-01 (×3): qty 2

## 2013-08-01 NOTE — Progress Notes (Addendum)
Tamara Silva 9:59 AM  Subjective: Patient doing a little better overall and did have a bowel movement and her chest pain is better although she was having some stress with a recent phone conversation  Objective: Vital signs stable afebrile no acute distress abdomen is soft nontender  Assessment: Multiple medical problems  Plan: I discussed colonoscopy and endoscopy with her for Friday morning and she believes she will get a PICC line today and we will begin a 2 day prep which still might be difficult based on her gastroparesis but we will try any how and the risks benefits and methods of the procedure were discussed  and will stop her blood thinners now and leave either heparin or Lovenox to the medical team if needed  [facility] E

## 2013-08-01 NOTE — Progress Notes (Signed)
Peripherally Inserted Central Catheter/Midline Placement  The IV Nurse has discussed with the patient and/or persons authorized to consent for the patient, the purpose of this procedure and the potential benefits and risks involved with this procedure.  The benefits include less needle sticks, lab draws from the catheter and patient may be discharged home with the catheter.  Risks include, but not limited to, infection, bleeding, blood clot (thrombus formation), and puncture of an artery; nerve damage and irregular heat beat.  Alternatives to this procedure were also discussed.  PICC/Midline Placement Documentation  PICC / Midline Single Lumen [id number] Midline Cephalic 14 cm 0 cm (Active)       Edson Snowball 08/01/2013, 8:54 PM

## 2013-08-01 NOTE — Progress Notes (Signed)
Patient able to drink can of Glucerna overnight and keep it down.  Patient also able to have a bowel movement.  Medicating with PRN Zofran.  No further complaints of chest pain.  Will continue to monitor patient.  Owens Shark, Lether Tesch Cherie

## 2013-08-01 NOTE — Progress Notes (Signed)
[facility] PROGRESS NOTE  Tamara Silva [id number] DOB: [birth year] DOA: 07/28/2013 PCP: Philis Fendt, MD Brief HPI:   Assessment/Plan   Atypical chest pain, may be related to dyspnea, MSK strain, ACS possible given age and comorbidities -  ECG:  NSR, subtle ST segment < 60mm elevations in V2-V5, which appear similar to prior, delayed R-wave progression -  CXR:  Appears similar to prior, fluid in fissure -  Troponin -  ASA allergy, on xarelto -  Telemetry -  NTG prn  Constipation, worsening, hx of colon cancer -  Possible colonoscopy later  Friday -  TSH 0.708 -  Continue stool softeners  Nausea without vomiting - most likely secondary to gastroparesis.  Drinking sips today.    -  Encourage small sips frequently overnight -  Continue oral erythromycin  -  Continue zofran and reglan -  Clear liquid diet.  -  GI consult:  Appreciate assistance!  Essential hypertension, uncontrolled -  continue carvedilol -  Start lisinopril  Acute on Chronic hypoxic respiratory failure likely multifactorial due to acute COPD exac, chronic diastolic heart failure, hx of PE, and pulmonary artery stenosis.  Acute COPD (chronic obstructive pulmonary disease) exac with increased cough and clear sputum - Continue prednisone day 3 - Already on abx as above - Walking pulse ox closer to discharge  Acute on chronic diastolic congestive heart failure -  Continue lasix and transition to IV once IV access obtained, diuresis may improve gastric emptying also -  pro-bnp approximately stable from prior: 1233 -  Trend potassium -  Daily weights -  Strict I/O   Hx of pulmonary embolus - continue xarelto  Pulmonary artery narrowing, may be contributing to resp failure  Diabetes mellitus, A1c 8.1, CBG well controlled -  Continue lantus at current dose  -  Continue sliding scale insulin  Morbid obesity with probable OHS and/or OSA -  Outpatient sleep study -  Consideration of bariatric  surgery  Normocytic anemia  -  Occult stool -  Iron studies, B12, folate  Diet:  CLD Access:  none IVF:  none Proph:  xarelto  Code Status: full Family Communication: patient alone Disposition Plan: possibly on Saturday.    Consultants:  GI  Procedures:  CT angio chest  CXR  Antibiotics:  none   HPI/Subjective:  NAUSEA no vomiting.   Objective: Filed Vitals:   08/01/13 0502 08/01/13 0817 08/01/13 1100 08/01/13 1346  BP:    142/80  Pulse:    57  Temp:    98.7 F (37.1 C)  TempSrc:    Oral  Resp:    18  Height:      Weight: 134.446 kg (296 lb 6.4 oz)     SpO2:  97% 98% 94%    Intake/Output Summary (Last 24 hours) at 08/01/13 1629 Last data filed at 08/01/13 1300  Gross per 24 hour  Intake    220 ml  Output      0 ml  Net    220 ml   Filed Weights   07/29/13 0115 07/31/13 1226 08/01/13 0502  Weight: 135.7 kg (299 lb 2.6 oz) 137.9 kg (304 lb 0.2 oz) 134.446 kg (296 lb 6.4 oz)    Exam:   General:  Obese AAF, No acute distress  HEENT:  NCAT, MMM  Cardiovascular:  RRR, nl S1, S2 no mrg, 2+ pulses, warm extremities  Respiratory:  Distant bilateral BS posteriorly, anterior sounds clear, no focal rales or rhonchi, no increased WOB  Abdomen:  NABS, soft, NT/ND  MSK:   Normal tone and bulk, no LEE  Neuro:  Grossly intact  Data Reviewed: Basic Metabolic Panel:  Recent Labs Lab 07/28/13 1950 07/29/13 0517 07/30/13 0645 07/31/13 0443 08/01/13 0445  NA 140 140 138 139 136*  K 4.0 4.0 3.8 3.6* 3.4*  CL 98 97 97 98 94*  CO2 26 29 31 30 31   GLUCOSE 194* 207* 171* 131* 217*  BUN 11 11 10 10 15   CREATININE 0.88 0.82 0.72 0.77 0.85  CALCIUM 9.0 8.9 8.9 8.9 8.7  MG  --  2.0  --   --   --   PHOS  --  3.2  --   --   --    Liver Function Tests:  Recent Labs Lab 07/28/13 1950 07/29/13 0517  AST 14 13  ALT 15 15  ALKPHOS 63 56  BILITOT <0.2* 0.2*  PROT 7.8 7.3  ALBUMIN 3.4* 3.3*    Recent Labs Lab 07/28/13 1950  LIPASE 15   No  results found for this basename: AMMONIA,  in the last 168 hours CBC:  Recent Labs Lab 07/28/13 1907 07/29/13 0517 07/30/13 0645 08/01/13 0445  WBC 8.4 8.7 9.5 10.4  NEUTROABS 5.8  --   --   --   HGB 12.3 11.0* 11.2* 11.0*  HCT 37.9 35.4* 36.4 35.8*  MCV 81.3 82.3 83.1 81.2  PLT 325 284 287 283   Cardiac Enzymes:  Recent Labs Lab 07/28/13 1950 07/31/13 1106 07/31/13 1712 08/01/13 0445  TROPONINI <0.30 <0.30 <0.30 <0.30   BNP (last 3 results)  Recent Labs  06/29/13 1430 07/01/13 0235 07/31/13 0443  PROBNP 770.4* 1194.0* 1233.0*   CBG:  Recent Labs Lab 07/31/13 2014 08/01/13 0106 08/01/13 0432 08/01/13 0743 08/01/13 1147  GLUCAP 176* 121* 191* 175* 193*    No results found for this or any previous visit (from the past 240 hour(s)).   Studies: Dg Chest Port 1 View  07/31/2013   CLINICAL DATA:  53 year old female chest pain, shortness of breath. Cardiomyopathy. Initial encounter.  EXAM: PORTABLE CHEST - 1 VIEW  COMPARISON:  Chest CTA 07/28/2013 and earlier.  FINDINGS: Portable AP upright view at 1119 hrs. Stable lung volumes. Stable cardiomegaly and mediastinal contours. Pulmonary vascularity mildly increased compared to 07/28/2013. No pleural effusion, pneumothorax or consolidation. Visualized tracheal air column is within normal limits.  IMPRESSION: Increased pulmonary vascular congestion.  Otherwise stable chest.   Electronically Signed   By: Lars Pinks M.D.   On: 07/31/2013 11:38    Scheduled Meds: . atorvastatin  10 mg Oral q1800  . carvedilol  25 mg Oral BID WC  . docusate sodium  100 mg Oral BID  . erythromycin ethylsuccinate  400 mg Oral TID  . FLUoxetine  20 mg Oral Daily  . fluticasone  2 spray Each Nare Daily  . furosemide  80 mg Oral BID  . guaiFENesin  600 mg Oral BID  . insulin aspart  0-9 Units Subcutaneous TID WC  . insulin glargine  35 Units Subcutaneous QHS  . ipratropium-albuterol  3 mL Nebulization TID  . lisinopril  5 mg Oral Daily  .  metoCLOPramide (REGLAN) injection  10 mg Intravenous TID AC & HS  . [START ON 08/03/2013] metoCLOPramide  10 mg Oral TID AC & HS  . pantoprazole  40 mg Oral Daily  . potassium chloride  10 mEq Oral BID  . predniSONE  40 mg Oral Q breakfast  . senna  1 tablet Oral BID  .  traZODone  100 mg Oral QHS   Continuous Infusions: . sodium chloride 1,000 mL (08/01/13 1157)    Active Problems:   Essential hypertension   COPD (chronic obstructive pulmonary disease)   Intractable nausea and vomiting   Chronic diastolic congestive heart failure   Nausea & vomiting   Gastroparesis   Hx of pulmonary embolus   Diabetes mellitus   Hypoxia   Acute respiratory failure with hypoxia   COPD with acute exacerbation   Morbid obesity    Time spent: 30 min    Cace Osorto  [facility] Pager [phone]. If 7PM-7AM, please contact night-coverage at [email or web address], password [facility] 08/01/2013, 4:29 PM  LOS: 4 days

## 2013-08-01 NOTE — ED Provider Notes (Signed)
Medical screening examination/treatment/procedure(s) were performed by non-physician practitioner and as supervising physician I was immediately available for consultation/collaboration.   EKG Interpretation   Date/Time:  Saturday July 28 2013 17:32:27 EST Ventricular Rate:  82 PR Interval:  172 QRS Duration: 82 QT Interval:  530 QTC Calculation: 619 R Axis:   57 Text Interpretation:  Sinus rhythm Left ventricular hypertrophy Anterior  infarct, old Prolonged QT interval No significant change since last  tracing Reconfirmed by [facility]  MD, Janiylah Hannis [id number]) on 08/01/2013 12:47:30  PM       Jasper Riling. Alvino Chapel, MD 08/01/13 1247

## 2013-08-02 LAB — PRO B NATRIURETIC PEPTIDE: Pro B Natriuretic peptide (BNP): 406 pg/mL — ABNORMAL HIGH (ref 0–125)

## 2013-08-02 LAB — GLUCOSE, CAPILLARY
GLUCOSE-CAPILLARY: 217 mg/dL — AB (ref 70–99)
Glucose-Capillary: 180 mg/dL — ABNORMAL HIGH (ref 70–99)
Glucose-Capillary: 226 mg/dL — ABNORMAL HIGH (ref 70–99)
Glucose-Capillary: 151 mg/dL — ABNORMAL HIGH (ref 70–99)
Glucose-Capillary: 207 mg/dL — ABNORMAL HIGH (ref 70–99)
Glucose-Capillary: 259 mg/dL — ABNORMAL HIGH (ref 70–99)

## 2013-08-02 LAB — BASIC METABOLIC PANEL
BUN: 23 mg/dL (ref 6–23)
CO2: 31 mEq/L (ref 19–32)
Calcium: 8.7 mg/dL (ref 8.4–10.5)
Chloride: 95 mEq/L — ABNORMAL LOW (ref 96–112)
Creatinine, Ser: 1.14 mg/dL — ABNORMAL HIGH (ref 0.50–1.10)
GFR calc Af Amer: 62 mL/min — ABNORMAL LOW (ref >90)
GFR, EST NON AFRICAN AMERICAN: 54 mL/min — AB (ref >90)
Glucose, Bld: 221 mg/dL — ABNORMAL HIGH (ref 70–99)
Potassium: 4.2 mEq/L (ref 3.7–5.3)
Sodium: 137 mEq/L (ref 137–147)

## 2013-08-02 LAB — CBC
HCT: 33.7 % — ABNORMAL LOW (ref 36.0–46.0)
Hemoglobin: 10.4 g/dL — ABNORMAL LOW (ref 12.0–15.0)
MCH: 25.1 pg — ABNORMAL LOW (ref 26.0–34.0)
MCHC: 30.9 g/dL (ref 30.0–36.0)
MCV: 81.4 fL (ref 78.0–100.0)
PLATELETS: 290 10*3/uL (ref 150–400)
RBC: 4.14 MIL/uL (ref 3.87–5.11)
RDW: 16.2 % — ABNORMAL HIGH (ref 11.5–15.5)
WBC: 10.5 10*3/uL (ref 4.0–10.5)

## 2013-08-02 MED ORDER — POLYETHYLENE GLYCOL 3350 17 GM/SCOOP PO POWD
1.0000 | Freq: Once | ORAL | Status: AC
  Administered 2013-08-02: 1 via ORAL
  Filled 2013-08-02: qty 255

## 2013-08-02 MED ORDER — SODIUM CHLORIDE 0.9 % IV SOLN
INTRAVENOUS | Status: DC
  Administered 2013-08-02: 1000 mL via INTRAVENOUS
  Administered 2013-08-03 (×2): via INTRAVENOUS

## 2013-08-02 MED ORDER — FUROSEMIDE 40 MG PO TABS
40.0000 mg | ORAL_TABLET | Freq: Two times a day (BID) | ORAL | Status: DC
  Administered 2013-08-02 – 2013-08-04 (×4): 40 mg via ORAL
  Filled 2013-08-02 (×6): qty 1

## 2013-08-02 MED ORDER — PREDNISONE 20 MG PO TABS
20.0000 mg | ORAL_TABLET | Freq: Every day | ORAL | Status: DC
  Administered 2013-08-03 – 2013-08-04 (×2): 20 mg via ORAL
  Filled 2013-08-02 (×3): qty 1

## 2013-08-02 MED ORDER — PREDNISONE 20 MG PO TABS
20.0000 mg | ORAL_TABLET | Freq: Every day | ORAL | Status: DC
  Filled 2013-08-02 (×2): qty 1

## 2013-08-02 NOTE — Progress Notes (Signed)
[facility] PROGRESS NOTE  Analyn Montag [id number] DOB: [birth year] DOA: 07/28/2013 PCP: Philis Fendt, MD Brief HPI: Tamara Silva is a 53 y.o. female with past medical history of Diabetes mellitus without complication; PE (pulmonary embolism); Hypercholesterolemia; Hypertension; COPD (chronic obstructive pulmonary disease); Asthma; Coronary artery disease; CHF (congestive heart failure); admitted 2/10-2/16 for emesis and was diagnosed with gastroparesis she was discharged on reglan and was supposed to follow up her PCP due to snow she was not able to go and run out of her reglan at that point hr nausea and vomiting resumed. IN ER she was noted to be hypoxic with oxygen saturation down to 89% on RA. Patient denies being on oxygen at home but states she needs it. Patient states she has had hypoxic events while hospitalized last week. She had xh of diastolic CHF, COPD and PE in May 2014 still on Xarelto. She denies any significant coughing but had some minor wheezing which is her baseline. Patient had a CTA done in ER which showed no acute PE but evidence of pulmonary artery narrowing likely from old PE. She was admitted to the hospitalist service for further evaluation and management of severe gastroparesis. GI consulted for severe gastroparesis. Shew as started on reglan and erythromycin . She is scheduled for colonoscopy and EGD on Friday to evaluate other causes of gastroparesis and chronic constipation.      Assessment/Plan   Atypical chest pain, may be related to dyspnea, MSK strain, ACS possible given age and comorbidities -  ECG:  NSR, subtle ST segment < 70mm elevations in V2-V5, which appear similar to prior, delayed R-wave progression -  CXR:  Appears similar to prior, fluid in fissure -  Troponin -  ASA allergy, on xarelto -  Telemetry -  NTG prn  Constipation, worsening, hx of colon cancer in the family -  Possible colonoscopy later  Friday -  TSH 0.708 -  Continue  stool softeners  Nausea without vomiting - most likely secondary to gastroparesis.  Drinking sips today.    -  Encourage small sips frequently overnight -  Continue oral erythromycin  -  Continue zofran and reglan -  Clear liquid diet.  -  GI consult:  Appreciate assistance!  Essential hypertension, uncontrolled -  continue carvedilol -  Start lisinopril  Acute on Chronic hypoxic respiratory failure likely multifactorial due to acute COPD exac, chronic diastolic heart failure, hx of PE, and pulmonary artery stenosis.  Acute COPD (chronic obstructive pulmonary disease) exac with increased cough and clear sputum - prednisone taper.  - Already on abx as above - Walking pulse ox closer to discharge  Acute on chronic diastolic congestive heart failure -  Continue lasix and her probnp has also improved from 1233 to 406.  - suspect she might be taking her medications.  -  Trend potassium and monitor renal parameters on lasix. Her creatinine worsened a little on 80 mg bid lasix, will change her back to 40 mg bid dose. -  Daily weights -  Strict I/O   Hx of pulmonary embolus - continue xarelto, which will be held today for procedure in am, can be resumed after the procedure.   Pulmonary artery narrowing, may be contributing to resp failure  Diabetes mellitus, A1c 8.1, CBG well controlled -  Continue lantus at current dose  -  Continue sliding scale insulin - CBG (last 3)   Recent Labs  08/02/13 0052 08/02/13 0512 08/02/13 0736  GLUCAP 226* 180* 151*  Morbid obesity with probable OHS and/or OSA -  Outpatient sleep study -  Consideration of bariatric surgery  Normocytic anemia  -  Occult stool pending.  - low ferritin, with low iron levels -   B12, folate adequate. - will need iron supplementation which will further cause her constipation.   Diet:  CLD Access:  none IVF:  none Proph:  xarelto which is being held for procedure in am.   Code Status: full Family  Communication: patient alone Disposition Plan: possibly on Saturday.    Consultants:  GI  Procedures:  CT angio chest  CXR  Antibiotics:  none   HPI/Subjective:  Nausea has improved, no vomiting. One big BM yesterday after miralax.   Objective: Filed Vitals:   08/01/13 2048 08/01/13 2053 08/02/13 0500 08/02/13 0813  BP:  108/52 105/59   Pulse:  58 56   Temp:  97.7 F (36.5 C) 97.6 F (36.4 C)   TempSrc:  Oral Oral   Resp:  18 24   Height:      Weight:   134.083 kg (295 lb 9.6 oz)   SpO2: 98% 94% 100% 94%    Intake/Output Summary (Last 24 hours) at 08/02/13 1031 Last data filed at 08/02/13 0600  Gross per 24 hour  Intake 1382.5 ml  Output      0 ml  Net 1382.5 ml   Filed Weights   07/31/13 1226 08/01/13 0502 08/02/13 0500  Weight: 137.9 kg (304 lb 0.2 oz) 134.446 kg (296 lb 6.4 oz) 134.083 kg (295 lb 9.6 oz)    Exam:   General:  Obese AAF, No acute distress  HEENT:  NCAT, MMM  Cardiovascular:  RRR, nl S1, S2 no mrg, 2+ pulses, warm extremities  Respiratory:  Distant bilateral BS posteriorly, anterior sounds clear, no focal rales or rhonchi, no increased WOB  Abdomen:   NABS, soft, NT/ND  MSK:   Normal tone and bulk, no LEE  Neuro:  Grossly intact  Data Reviewed: Basic Metabolic Panel:  Recent Labs Lab 07/28/13 1950 07/29/13 0517 07/30/13 0645 07/31/13 0443 08/01/13 0445 08/02/13 0410  NA 140 140 138 139 136* 137  K 4.0 4.0 3.8 3.6* 3.4* 4.2  CL 98 97 97 98 94* 95*  CO2 26 29 31 30 31 31   GLUCOSE 194* 207* 171* 131* 217* 221*  BUN 11 11 10 10 15 23   CREATININE 0.88 0.82 0.72 0.77 0.85 1.14*  CALCIUM 9.0 8.9 8.9 8.9 8.7 8.7  MG  --  2.0  --   --   --   --   PHOS  --  3.2  --   --   --   --    Liver Function Tests:  Recent Labs Lab 07/28/13 1950 07/29/13 0517  AST 14 13  ALT 15 15  ALKPHOS 63 56  BILITOT <0.2* 0.2*  PROT 7.8 7.3  ALBUMIN 3.4* 3.3*    Recent Labs Lab 07/28/13 1950  LIPASE 15   No results found for  this basename: AMMONIA,  in the last 168 hours CBC:  Recent Labs Lab 07/28/13 1907 07/29/13 0517 07/30/13 0645 08/01/13 0445 08/02/13 0410  WBC 8.4 8.7 9.5 10.4 10.5  NEUTROABS 5.8  --   --   --   --   HGB 12.3 11.0* 11.2* 11.0* 10.4*  HCT 37.9 35.4* 36.4 35.8* 33.7*  MCV 81.3 82.3 83.1 81.2 81.4  PLT 325 284 287 283 290   Cardiac Enzymes:  Recent Labs Lab 07/28/13 1950 07/31/13  1106 07/31/13 1712 08/01/13 0445  TROPONINI <0.30 <0.30 <0.30 <0.30   BNP (last 3 results)  Recent Labs  07/01/13 0235 07/31/13 0443 08/02/13 0410  PROBNP 1194.0* 1233.0* 406.0*   CBG:  Recent Labs Lab 08/01/13 1636 08/01/13 2046 08/02/13 0052 08/02/13 0512 08/02/13 0736  GLUCAP 238* 195* 226* 180* 151*    No results found for this or any previous visit (from the past 240 hour(s)).   Studies: Dg Chest Port 1 View  07/31/2013   CLINICAL DATA:  53 year old female chest pain, shortness of breath. Cardiomyopathy. Initial encounter.  EXAM: PORTABLE CHEST - 1 VIEW  COMPARISON:  Chest CTA 07/28/2013 and earlier.  FINDINGS: Portable AP upright view at 1119 hrs. Stable lung volumes. Stable cardiomegaly and mediastinal contours. Pulmonary vascularity mildly increased compared to 07/28/2013. No pleural effusion, pneumothorax or consolidation. Visualized tracheal air column is within normal limits.  IMPRESSION: Increased pulmonary vascular congestion.  Otherwise stable chest.   Electronically Signed   By: Lars Pinks M.D.   On: 07/31/2013 11:38    Scheduled Meds: . atorvastatin  10 mg Oral q1800  . carvedilol  25 mg Oral BID WC  . docusate sodium  100 mg Oral BID  . erythromycin ethylsuccinate  400 mg Oral TID  . FLUoxetine  20 mg Oral Daily  . fluticasone  2 spray Each Nare Daily  . furosemide  80 mg Oral BID  . guaiFENesin  600 mg Oral BID  . insulin aspart  0-9 Units Subcutaneous TID WC  . insulin glargine  35 Units Subcutaneous QHS  . ipratropium-albuterol  3 mL Nebulization TID  .  lisinopril  5 mg Oral Daily  . metoCLOPramide (REGLAN) injection  10 mg Intravenous TID AC & HS  . [START ON 08/03/2013] metoCLOPramide  10 mg Oral TID AC & HS  . pantoprazole  40 mg Oral Daily  . potassium chloride  20 mEq Oral BID  . predniSONE  40 mg Oral Q breakfast  . senna  1 tablet Oral BID  . traZODone  100 mg Oral QHS   Continuous Infusions: . sodium chloride 1,000 mL (08/01/13 1157)  . sodium chloride 1,000 mL (08/02/13 1000)    Active Problems:   Essential hypertension   COPD (chronic obstructive pulmonary disease)   Intractable nausea and vomiting   Chronic diastolic congestive heart failure   Nausea & vomiting   Gastroparesis   Hx of pulmonary embolus   Diabetes mellitus   Hypoxia   Acute respiratory failure with hypoxia   COPD with acute exacerbation   Morbid obesity    Time spent: 30 min    Shaunte Tuft  [facility] Pager [phone]. If 7PM-7AM, please contact night-coverage at [email or web address], password [facility] 08/02/2013, 10:31 AM  LOS: 5 days

## 2013-08-02 NOTE — Progress Notes (Signed)
Received referral for [facility] services on behalf of COPD [employer] program. [facility] not contracted with Medicaid which is patient's primary payor. Therefore patient not [facility] eligible. However, not sure if patient is active with [facility]. Referral made just in case. Left voicemail for Kim with [facility]. Will make inpatient RN case manager aware.  Marthenia Rolling, MSN- RN, [facility] [phone]

## 2013-08-02 NOTE — Progress Notes (Signed)
Tamara Silva 9:31 AM  Subjective: Patient doing much better and in good spirits and believes constipation was playing a significant role with her symptoms and no signs of bleeding  Objective: Vital signs stable afebrile no acute distress abdomen is soft nontender labs stable  Assessment: Nausea vomiting probably gastroparesis and increased constipation in patient with a strong family history of colon cancer  Plan: Will continue prep today and again we rediscussed colonoscopy and endoscopy tomorrow and she is in agreement  [facility] E

## 2013-08-02 NOTE — Progress Notes (Signed)
[facility]  Patient Status: Active (receiving services up to time of hospitalization)  [facility] is providing the following services: RN  If patient discharges after hours, please call [phone].   Corliss Parish 08/02/2013, 12:41 PM

## 2013-08-02 NOTE — Evaluation (Signed)
Physical Therapy One Time Evaluation Patient Details Name: Tamara Silva MRN: [id number] DOB: Aug 13, 1960 Today's Date: 08/02/2013 Time: [id number] PT Time Calculation (min): 18 min  PT Assessment / Plan / Recommendation History of Present Illness  53 y.o. female with past medical history of Diabetes mellitus without complication; PE (pulmonary embolism); Hypercholesterolemia; Hypertension; COPD (chronic obstructive pulmonary disease); Asthma; Coronary artery disease; CHF (congestive heart failure); admitted 2/10-2/16 for emesis and was diagnosed with gastroparesis she was discharged on reglan and was supposed to follow up her PCP due to snow she was not able to go and run out of her reglan at that point hr nausea and vomiting resumed. IN ER she was noted to be hypoxic with oxygen saturation down to 89% on RA. Patient denies being on oxygen at home but states she needs it. Patient states she has had hypoxic events while hospitalized last week. She had xh of diastolic CHF, COPD and PE in May 2014 still on Xarelto. She denies any significant coughing but had some minor wheezing which is her baseline. Patient had a CTA done in ER which showed no acute PE but evidence of pulmonary artery narrowing likely from old PE. She was admitted to the hospitalist service for further evaluation and management of severe gastroparesis. GI consulted for severe gastroparesis. Shew as started on reglan and erythromycin . She is scheduled for colonoscopy and EGD on Friday to evaluate other causes of gastroparesis and chronic constipation.    Clinical Impression  Patient evaluated by Physical Therapy with no further acute PT needs identified. All education has been completed and the patient has no further questions.  Pt reports she had rollator however left behind during her move to [place] and states she has no way of getting it here, requesting new rollator if possible, declines RW.  Pt reports she is unable to finish  anything at home due to getting SOB frequently.  Pt educated to take breaks before she gets to point of extreme SOB in order to continue with activities however pt states she has "tried that and it doesn't work."  Pt reports she needs oxygen at home to assist her dyspnea with activity however SpO2 88-95% on room air during ambulation. See below for any follow-up Physial Therapy or equipment needs. PT is signing off. Thank you for this referral.     PT Assessment  Patent does not need any further PT services    Follow Up Recommendations  No PT follow up    Does the patient have the potential to tolerate intense rehabilitation      Barriers to Discharge        Equipment Recommendations  Other (comment) (pt would like rollator)    Recommendations for Other Services     Frequency      Precautions / Restrictions     Pertinent Vitals/Pain See below for SpO2      Mobility  Bed Mobility Overal bed mobility: Modified Independent Transfers Overall transfer level: Modified independent Ambulation/Gait Ambulation/Gait assistance: Supervision Ambulation Distance (Feet): 400 Feet Assistive device: Rolling walker (2 wheeled);None Gait Pattern/deviations: Step-through pattern Gait velocity: decr General Gait Details: pt requested using RW and then wished to go without AD after 20 feet stating she wouldn't use one at home, SpO2 88-95% on room air during ambulation with 3-4 short standing rest breaks required due to fatigue and SOB.    Exercises     PT Diagnosis:    PT Problem List:   PT Treatment Interventions:  PT Goals(Current goals can be found in the care plan section) Acute Rehab PT Goals PT Goal Formulation: No goals set, d/c therapy  Visit Information  Last PT Received On: 08/02/13 Assistance Needed: +1 History of Present Illness: 53 y.o. female with past medical history of Diabetes mellitus without complication; PE (pulmonary embolism); Hypercholesterolemia;  Hypertension; COPD (chronic obstructive pulmonary disease); Asthma; Coronary artery disease; CHF (congestive heart failure); admitted 2/10-2/16 for emesis and was diagnosed with gastroparesis she was discharged on reglan and was supposed to follow up her PCP due to snow she was not able to go and run out of her reglan at that point hr nausea and vomiting resumed. IN ER she was noted to be hypoxic with oxygen saturation down to 89% on RA. Patient denies being on oxygen at home but states she needs it. Patient states she has had hypoxic events while hospitalized last week. She had xh of diastolic CHF, COPD and PE in May 2014 still on Xarelto. She denies any significant coughing but had some minor wheezing which is her baseline. Patient had a CTA done in ER which showed no acute PE but evidence of pulmonary artery narrowing likely from old PE. She was admitted to the hospitalist service for further evaluation and management of severe gastroparesis. GI consulted for severe gastroparesis. Shew as started on reglan and erythromycin . She is scheduled for colonoscopy and EGD on Friday to evaluate other causes of gastroparesis and chronic constipation.         Prior [place] expects to be discharged to:: Private residence Living Arrangements: Other (Comment) (Roommate) Type of Home: [place] Access: Level entry Home Layout: One level Home Equipment: None Prior Function Level of Independence: Independent Comments: states she had rollator but I had to leave it in [place] when she moved to [place] 8 months ago Communication Communication: No difficulties    Cognition  Cognition Arousal/Alertness: Awake/alert Behavior During Therapy: WFL for tasks assessed/performed Overall Cognitive Status: Within Functional Limits for tasks assessed    Extremity/Trunk Assessment Lower Extremity Assessment Lower Extremity Assessment: Overall WFL for tasks assessed   Balance     End of Session PT - End of Session Activity Tolerance: Patient limited by fatigue Patient left: in bed;with call bell/phone within reach;with nursing/sitter in room Nurse Communication: Mobility status  GP     Tamara Silva,KATHrine E 08/02/2013, 3:45 PM Carmelia Bake, PT, DPT 08/02/2013 Pager: [phone]

## 2013-08-02 NOTE — Progress Notes (Signed)
NUTRITION FOLLOW UP/EDUCATION  Intervention:    Diet advancement per MD  Diet education for Diabetes and Constipation, completed  RD to follow nutrition care plan  Nutrition Dx:   Inadequate oral intake related to acute illness as evidenced by documented PO intake and patient report, ongoing   Goal:   Patient to meet >/= 90% of estimated nutrition needs, progressing  Monitor:   PO intake and diet advancement, weight trends, labs, I/Os  Assessment:   Patient with a past medical history of Diabetes mellitus without complication; PE (pulmonary embolism); Hypercholesterolemia; Hypertension; COPD (chronic obstructive pulmonary disease); Asthma; Coronary artery disease; CHF (congestive heart failure); Mental disorder; Shortness of breath; GERD (gastroesophageal reflux disease); Neuromuscular disorder; Ischemic cardiomyopathy; Bipolar disorder; and Post-traumatic stress syndrome.   Patient was admitted 2/10-2/16 for emesis and was diagnosed with gastroparesis she was discharged on reglan and was supposed to follow up her PCP due to snow she was not able to go and run out of her reglan at that point hr nausea and vomiting resumed. IN ER she was noted to be hypoxic with oxygen saturation down to 89% on RA. Patient denies being on oxygen at home but states she needs it.  Currently nausea has improved. Will admit for hypoxia. Patient denies any diarrhea just nausea and vomiting.   3/2:  Patient reports that she has had minimal intake over the past month due to reoccurring episodes of nausea and vomiting. Per chart history, weight was 313 lb in early February and current weight is 299 lb which is a 4.5% weight loss, not significant.   Patient is currently on clear liquids and will be NPO at midnight for colonoscopy and endoscopy on 3/6. Patient currently eating 75-100% of clear liquids. Patient's current weight is 295 which is now a 6% weight loss since in one month. Patient stated that she is having  issues with constipation and abdominal pain.   Dietetic intern was consulted for diabetes and constipation diet education on 3/5. Upon dietetic intern visit patient had many questions about specific foods. Dietetic intern reviewed sources of carbohydrate and discouraged patient from drinking sugar sweetened beverages and concentrated sweets. Dietetic intern made suggestions for increasing fiber in diet. Provided patient with "Carbohydrate Counting for People with Diabetes" and "Constipation Nutrition Therapy" from the Academy of Nutrition and Dietetics. Patient was receptive to education. Teach back method used. Expect fair compliance.   Sodium low at 136, patient receiving sodium chloride infusion. Potassium low at 3.4. CBGs elevated:  180, 151, 207  Height: Ht Readings from Last 1 Encounters:  07/31/13 5\' 8"  (1.727 m)    Weight Status:   Wt Readings from Last 1 Encounters:  08/02/13 295 lb 9.6 oz (134.083 kg)  07/29/13            299 lb  07/05/13            313 lb  Body mass index is 44.96 kg/(m^2). Patient morbidly obese  Re-estimated needs:  Kcal: 1900-2100  Protein: 80-100 grams  Fluid: 1.9-2.1 L  Skin: no wounds  Diet Order: Clear Liquid   Intake/Output Summary (Last 24 hours) at 08/02/13 1345 Last data filed at 08/02/13 0900  Gross per 24 hour  Intake 1622.5 ml  Output      0 ml  Net 1622.5 ml    Last BM: 3/5   Labs:   Recent Labs Lab 07/28/13 1950 07/29/13 0517  07/31/13 0443 08/01/13 0445 08/02/13 0410  NA 140 140  < > 139  136* 137  K 4.0 4.0  < > 3.6* 3.4* 4.2  CL 98 97  < > 98 94* 95*  CO2 26 29  < > 30 31 31   BUN 11 11  < > 10 15 23   CREATININE 0.88 0.82  < > 0.77 0.85 1.14*  CALCIUM 9.0 8.9  < > 8.9 8.7 8.7  MG  --  2.0  --   --   --   --   PHOS  --  3.2  --   --   --   --   GLUCOSE 194* 207*  < > 131* 217* 221*  < > = values in this interval not displayed.  CBG (last 3)   Recent Labs  08/02/13 0512 08/02/13 0736 08/02/13 1239  GLUCAP  180* 151* 207*    Scheduled Meds: . atorvastatin  10 mg Oral q1800  . carvedilol  25 mg Oral BID WC  . docusate sodium  100 mg Oral BID  . erythromycin ethylsuccinate  400 mg Oral TID  . FLUoxetine  20 mg Oral Daily  . fluticasone  2 spray Each Nare Daily  . furosemide  40 mg Oral BID  . guaiFENesin  600 mg Oral BID  . insulin aspart  0-9 Units Subcutaneous TID WC  . insulin glargine  35 Units Subcutaneous QHS  . ipratropium-albuterol  3 mL Nebulization TID  . lisinopril  5 mg Oral Daily  . metoCLOPramide (REGLAN) injection  10 mg Intravenous TID AC & HS  . [START ON 08/03/2013] metoCLOPramide  10 mg Oral TID AC & HS  . pantoprazole  40 mg Oral Daily  . [START ON 08/03/2013] predniSONE  20 mg Oral QAC breakfast  . senna  1 tablet Oral BID  . traZODone  100 mg Oral QHS    Continuous Infusions: . sodium chloride 1,000 mL (08/02/13 1000)    Claudell Kyle, Dietetic Intern Pager: [phone]   I have read and agree with follow up/education note completed by Claudell Kyle, DI [facility] [place] Clinical Dietitian [id number]

## 2013-08-03 ENCOUNTER — Encounter (HOSPITAL_COMMUNITY): Payer: Medicaid Other | Admitting: Anesthesiology

## 2013-08-03 ENCOUNTER — Inpatient Hospital Stay (HOSPITAL_COMMUNITY): Payer: Medicaid Other | Admitting: Anesthesiology

## 2013-08-03 ENCOUNTER — Encounter (HOSPITAL_COMMUNITY): Payer: Self-pay

## 2013-08-03 ENCOUNTER — Encounter (HOSPITAL_COMMUNITY)
Admission: EM | Disposition: A | Discharge status: Home Health | Payer: Self-pay | Source: Home / Self Care | Attending: Internal Medicine

## 2013-08-03 DIAGNOSIS — R112 Nausea with vomiting, unspecified: Secondary | ICD-10-CM

## 2013-08-03 HISTORY — PX: ESOPHAGOGASTRODUODENOSCOPY (EGD) WITH PROPOFOL: SHX5813

## 2013-08-03 HISTORY — PX: COLONOSCOPY WITH PROPOFOL: SHX5780

## 2013-08-03 LAB — CBC
HCT: 35 % — ABNORMAL LOW (ref 36.0–46.0)
Hemoglobin: 11 g/dL — ABNORMAL LOW (ref 12.0–15.0)
MCH: 25.5 pg — AB (ref 26.0–34.0)
MCHC: 31.4 g/dL (ref 30.0–36.0)
MCV: 81.2 fL (ref 78.0–100.0)
Platelets: 294 10*3/uL (ref 150–400)
RBC: 4.31 MIL/uL (ref 3.87–5.11)
RDW: 16.1 % — ABNORMAL HIGH (ref 11.5–15.5)
WBC: 8 10*3/uL (ref 4.0–10.5)

## 2013-08-03 LAB — BASIC METABOLIC PANEL
BUN: 13 mg/dL (ref 6–23)
CHLORIDE: 98 meq/L (ref 96–112)
CO2: 29 mEq/L (ref 19–32)
Calcium: 8.5 mg/dL (ref 8.4–10.5)
Creatinine, Ser: 0.82 mg/dL (ref 0.50–1.10)
GFR calc Af Amer: >90 mL/min (ref >90)
GFR calc non Af Amer: 80 mL/min — ABNORMAL LOW (ref >90)
GLUCOSE: 140 mg/dL — AB (ref 70–99)
POTASSIUM: 3.7 meq/L (ref 3.7–5.3)
Sodium: 137 mEq/L (ref 137–147)

## 2013-08-03 LAB — GLUCOSE, CAPILLARY
GLUCOSE-CAPILLARY: 117 mg/dL — AB (ref 70–99)
GLUCOSE-CAPILLARY: 140 mg/dL — AB (ref 70–99)
GLUCOSE-CAPILLARY: 154 mg/dL — AB (ref 70–99)
GLUCOSE-CAPILLARY: 46 mg/dL — AB (ref 70–99)
Glucose-Capillary: 118 mg/dL — ABNORMAL HIGH (ref 70–99)
Glucose-Capillary: 119 mg/dL — ABNORMAL HIGH (ref 70–99)
Glucose-Capillary: 143 mg/dL — ABNORMAL HIGH (ref 70–99)
Glucose-Capillary: 146 mg/dL — ABNORMAL HIGH (ref 70–99)
Glucose-Capillary: 48 mg/dL — ABNORMAL LOW (ref 70–99)
Glucose-Capillary: 63 mg/dL — ABNORMAL LOW (ref 70–99)
Glucose-Capillary: 69 mg/dL — ABNORMAL LOW (ref 70–99)
Glucose-Capillary: 79 mg/dL (ref 70–99)

## 2013-08-03 SURGERY — ESOPHAGOGASTRODUODENOSCOPY (EGD) WITH PROPOFOL
Anesthesia: Monitor Anesthesia Care

## 2013-08-03 MED ORDER — PROPOFOL INFUSION 10 MG/ML OPTIME
INTRAVENOUS | Status: DC | PRN
  Administered 2013-08-03: 120 ug/kg/min via INTRAVENOUS

## 2013-08-03 MED ORDER — DEXTROSE 50 % IV SOLN
1.0000 | Freq: Once | INTRAVENOUS | Status: AC
  Administered 2013-08-03: 50 mL via INTRAVENOUS
  Filled 2013-08-03: qty 50

## 2013-08-03 MED ORDER — SODIUM CHLORIDE 0.9 % IV SOLN: INTRAVENOUS | Status: DC

## 2013-08-03 MED ORDER — KETAMINE HCL 10 MG/ML IJ SOLN
INTRAMUSCULAR | Status: DC | PRN
  Administered 2013-08-03: 10 mg via INTRAVENOUS
  Administered 2013-08-03: 20 mg via INTRAVENOUS
  Administered 2013-08-03: 10 mg via INTRAVENOUS

## 2013-08-03 MED ORDER — ONDANSETRON HCL 4 MG/2ML IJ SOLN
INTRAMUSCULAR | Status: AC
  Filled 2013-08-03: qty 2

## 2013-08-03 MED ORDER — PROPOFOL 10 MG/ML IV BOLUS
INTRAVENOUS | Status: AC
  Filled 2013-08-03: qty 20

## 2013-08-03 MED ORDER — DEXTROSE 50 % IV SOLN
INTRAVENOUS | Status: AC
  Filled 2013-08-03: qty 50

## 2013-08-03 MED ORDER — DEXTROSE 50 % IV SOLN
INTRAVENOUS | Status: DC | PRN
  Administered 2013-08-03: 12.5 g via INTRAVENOUS

## 2013-08-03 MED ORDER — KETAMINE HCL 10 MG/ML IJ SOLN
INTRAMUSCULAR | Status: AC
  Filled 2013-08-03: qty 1

## 2013-08-03 MED ORDER — EPHEDRINE SULFATE 50 MG/ML IJ SOLN
INTRAMUSCULAR | Status: AC
  Filled 2013-08-03: qty 1

## 2013-08-03 MED ORDER — SODIUM CHLORIDE 0.9 % IJ SOLN
INTRAMUSCULAR | Status: AC
  Filled 2013-08-03: qty 10

## 2013-08-03 MED ORDER — MIDAZOLAM HCL 5 MG/5ML IJ SOLN
INTRAMUSCULAR | Status: DC | PRN
  Administered 2013-08-03 (×2): 1 mg via INTRAVENOUS

## 2013-08-03 MED ORDER — DEXTROSE 50 % IV SOLN: 50.0000 mL | Freq: Once | INTRAVENOUS | Status: AC | PRN

## 2013-08-03 MED ORDER — DEXTROSE 50 % IV SOLN: 25.0000 mL | Freq: Once | INTRAVENOUS | Status: AC | PRN

## 2013-08-03 MED ORDER — RIVAROXABAN 20 MG PO TABS
20.0000 mg | ORAL_TABLET | Freq: Every day | ORAL | Status: DC
  Administered 2013-08-03: 20 mg via ORAL
  Filled 2013-08-03 (×2): qty 1

## 2013-08-03 MED ORDER — MIDAZOLAM HCL 2 MG/2ML IJ SOLN
INTRAMUSCULAR | Status: AC
  Filled 2013-08-03: qty 2

## 2013-08-03 MED ORDER — ATROPINE SULFATE 0.4 MG/ML IJ SOLN
INTRAMUSCULAR | Status: AC
  Filled 2013-08-03: qty 1

## 2013-08-03 MED ORDER — PROMETHAZINE HCL 25 MG/ML IJ SOLN: 6.2500 mg | INTRAMUSCULAR | Status: DC | PRN

## 2013-08-03 MED ORDER — DEXTROSE-NACL 5-0.9 % IV SOLN
INTRAVENOUS | Status: DC
  Administered 2013-08-03: 05:00:00 via INTRAVENOUS

## 2013-08-03 SURGICAL SUPPLY — 24 items

## 2013-08-03 NOTE — Transfer of Care (Signed)
Immediate Anesthesia Transfer of Care Note  Patient: Tamara Silva  Procedure(s) Performed: Procedure(s): ESOPHAGOGASTRODUODENOSCOPY (EGD) WITH PROPOFOL (N/A) COLONOSCOPY WITH PROPOFOL (N/A)  Patient Location: PACU and Endoscopy Unit  Anesthesia Type:MAC  Level of Consciousness: awake, alert , oriented and patient cooperative  Airway & Oxygen Therapy: Patient Spontanous Breathing and Patient connected to nasal cannula oxygen  Post-op Assessment: Report given to PACU RN, Post -op Vital signs reviewed and stable and Patient moving all extremities X 4  Post vital signs: Reviewed and stable  Complications: No apparent anesthesia complications

## 2013-08-03 NOTE — Progress Notes (Signed)
Pt's 4am CBG was 69.  Notified NP L. Royersford on call.  New orders received for pt to receive D5NS at 75cc/hr.  No other orders.  Will recheck CBG in 30 mins and call NP back if CBG still abnormal.  Will continue to monitor pt.

## 2013-08-03 NOTE — Op Note (Signed)
[facility] [place], [address]   ENDOSCOPY PROCEDURE REPORT  PATIENT: Tamara, Silva  MR#: [id number] BIRTHDATE: 1960-07-12 , 70  yrs. old GENDER: Female  ENDOSCOPIST: Clarene Essex, MD REFERRED BY:  PROCEDURE DATE:  08/03/2013 PROCEDURE:   EGD, diagnostic ASA CLASS:   Class III INDICATIONS:Nausea and Vomiting.  MEDICATIONS: propofol (Diprivan) 150mg  IV and Versed 2 mg IV ketamine 20 mg  TOPICAL ANESTHETIC:  DESCRIPTION OF PROCEDURE:   After the risks benefits and alternatives of the procedure were thoroughly explained, informed consent was obtained.  The Pentax Gastroscope [id number]  endoscope was introduced through the mouth and advanced to the third portion of the duodenum , limited by Without limitations.   The instrument was slowly withdrawn as the mucosa was fully examined.the findings are recorded below and the patient tolerated the procedure well there was no obvious immediate         FINDINGS:1. Questionable tiny hiatal hernia 2. Very minimal gastritis 3. Otherwise within normal limits to probably the third portion of the duodenum  COMPLICATIONS:none  ENDOSCOPIC IMPRESSION: above   RECOMMENDATIONS:proceed with colonoscopy please see that report for other recommendations and plans   REPEAT EXAM: as needed   _______________________________ Clarene Essex, MD eSigned:  Clarene Essex, MD 08/03/2013 8:41 AM    CC:  PATIENT NAME:  Tamara, Silva MR#: [id number]

## 2013-08-03 NOTE — Anesthesia Preprocedure Evaluation (Addendum)
Anesthesia Evaluation  Patient identified by MRN, date of birth, ID band Patient awake    Reviewed: Allergy & Precautions, H&P , NPO status , Patient's Chart, lab work & pertinent test results  Airway Mallampati: III TM Distance: <3 FB Neck ROM: Full    Dental  (+) Loose, Chipped, Poor Dentition,    Pulmonary asthma , COPD COPD inhaler, former smoker,  breath sounds clear to auscultation  + decreased breath sounds      Cardiovascular hypertension, Pt. on medications Rhythm:Regular Rate:Normal  Left ventricle: The cavity size was mildly dilated. Wall   thickness was increased in a pattern of mild LVH. Systolic   function was moderately reduced. The estimated ejection   fraction was in the range of 35% to 40%. There is mild   hypokinesis of the mid-distalanteroseptal myocardium.   Features are consistent with a pseudonormal left   ventricular filling pattern, with concomitant abnormal   relaxation and increased filling pressure (grade 2   diastolic dysfunction).    Neuro/Psych negative neurological ROS  negative psych ROS   GI/Hepatic negative GI ROS, Neg liver ROS,   Endo/Other  diabetes, Insulin DependentMorbid obesity  Renal/GU negative Renal ROS  negative genitourinary   Musculoskeletal negative musculoskeletal ROS (+)   Abdominal (+) + obese,   Peds negative pediatric ROS (+)  Hematology negative hematology ROS (+)   Anesthesia Other Findings   Reproductive/Obstetrics negative OB ROS                         Anesthesia Physical Anesthesia Plan  ASA: III  Anesthesia Plan: MAC   Post-op Pain Management:    Induction: Intravenous  Airway Management Planned: Nasal Cannula  Additional Equipment:   Intra-op Plan:   Post-operative Plan:   Informed Consent: I have reviewed the patients History and Physical, chart, labs and discussed the procedure including the risks, benefits and  alternatives for the proposed anesthesia with the patient or authorized representative who has indicated his/her understanding and acceptance.   Dental advisory given  Plan Discussed with: CRNA and Surgeon  Anesthesia Plan Comments:         Anesthesia Quick Evaluation

## 2013-08-03 NOTE — Anesthesia Postprocedure Evaluation (Signed)
  Anesthesia Post-op Note  Patient: Tamara Silva  Procedure(s) Performed: Procedure(s) (LRB): ESOPHAGOGASTRODUODENOSCOPY (EGD) WITH PROPOFOL (N/A) COLONOSCOPY WITH PROPOFOL (N/A)  Patient Location: PACU  Anesthesia Type: MAC  Level of Consciousness: awake and alert   Airway and Oxygen Therapy: Patient Spontanous Breathing  Post-op Pain: mild  Post-op Assessment: Post-op Vital signs reviewed, Patient's Cardiovascular Status Stable, Respiratory Function Stable, Patent Airway and No signs of Nausea or vomiting  Last Vitals:  Filed Vitals:   08/03/13 0900  BP: 159/93  Pulse:   Temp:   Resp: 17    Post-op Vital Signs: stable   Complications: No apparent anesthesia complications

## 2013-08-03 NOTE — Progress Notes (Addendum)
Hypoglycemic Event  CBG: 69  Treatment: D5 NS at 75cc/hr  Symptoms: None  Follow-up CBG: Time:0550am CBG Result:79  Possible Reasons for Event: Inadequate meal intake  (pt NPO since MN for procedure)  Comments/MD notified:  L. Easterwood, NP    Larren Copes L  Remember to initiate Hypoglycemia Order Set & complete

## 2013-08-03 NOTE — Progress Notes (Signed)
[facility] PROGRESS NOTE  Meaghen Thiesse [id number] DOB: 1961-02-04 DOA: 07/28/2013 PCP: Philis Fendt, MD Brief HPI: Tamara Silva is a 53 y.o. female with past medical history of Diabetes mellitus without complication; PE (pulmonary embolism); Hypercholesterolemia; Hypertension; COPD (chronic obstructive pulmonary disease); Asthma; Coronary artery disease; CHF (congestive heart failure); admitted 2/10-2/16 for emesis and was diagnosed with gastroparesis she was discharged on reglan and was supposed to follow up her PCP due to snow she was not able to go and run out of her reglan at that point hr nausea and vomiting resumed. IN ER she was noted to be hypoxic with oxygen saturation down to 89% on RA. Patient denies being on oxygen at home but states she needs it. Patient states she has had hypoxic events while hospitalized last week. She had xh of diastolic CHF, COPD and PE in May 2014 still on Xarelto. She denies any significant coughing but had some minor wheezing which is her baseline. Patient had a CTA done in ER which showed no acute PE but evidence of pulmonary artery narrowing likely from old PE. She was admitted to the hospitalist service for further evaluation and management of severe gastroparesis. GI consulted for severe gastroparesis. Shew as started on reglan and erythromycin . She is scheduled for colonoscopy and EGD on Friday to evaluate other causes of gastroparesis and chronic constipation.      Assessment/Plan   Atypical chest pain, may be related to dyspnea, MSK strain, ACS possible given age and comorbidities -  ECG:  NSR, subtle ST segment < 19mm elevations in V2-V5, which appear similar to prior, delayed R-wave progression -  CXR:  Appears similar to prior, fluid in fissure -  Troponin -  ASA allergy, on xarelto -  Telemetry -  NTG prn  Constipation, worsening, hx of colon cancer in the family -  Possible colonoscopy later  Friday -  TSH 0.708 -  Continue  stool softeners  Nausea without vomiting - most likely secondary to gastroparesis.  She underwent EGD and colonoscopy today. EGD showed mild hernia, and colonoscopy showed polyps.  -  Continue oral erythromycin  -  Continue zofran and reglan -  Clear liquid diet and advance as tolerated.   -  GI consult:  Appreciate assistance!  Essential hypertension, uncontrolled -  continue carvedilol -  Start lisinopril  Acute on Chronic hypoxic respiratory failure likely multifactorial due to acute COPD exac, chronic diastolic heart failure, hx of PE, and pulmonary artery stenosis.  Acute COPD (chronic obstructive pulmonary disease) exac with increased cough and clear sputum - prednisone taper.  - Already on abx as above - Walking pulse ox closer to discharge  Acute on chronic diastolic congestive heart failure -  Continue lasix and her probnp has also improved from 1233 to 406.  - suspect she might be taking her medications.  -  Trend potassium and monitor renal parameters on lasix. Her creatinine worsened a little on 80 mg bid lasix, will change her back to 40 mg bid dose. -  Daily weights -  Strict I/O   Hx of pulmonary embolus - continue xarelto, which will be held today for procedure in am, can be resumed after the procedure.   Pulmonary artery narrowing, may be contributing to resp failure  Diabetes mellitus, A1c 8.1, CBG well controlled -  Continue lantus at current dose  -  Continue sliding scale insulin - CBG (last 3)   Recent Labs  08/03/13 1007 08/03/13 1212 08/03/13 1643  GLUCAP 143*  118* 140*      Morbid obesity with probable OHS and/or OSA -  Outpatient sleep study -  Consideration of bariatric surgery  Normocytic anemia  -  Occult stool pending.  - low ferritin, with low iron levels -   B12, folate adequate. - will need iron supplementation which will further cause her constipation.   Diet:  CLD Access:  none IVF:  none Proph:  xarelto which is being held  for procedure in am.   Code Status: full Family Communication: patient alone Disposition Plan: possibly on Saturday.    Consultants:  GI  Procedures:  CT angio chest  CXR  Antibiotics:  none   HPI/Subjective:  Nausea present.  No vomiting.    Objective: Filed Vitals:   08/03/13 0850 08/03/13 0900 08/03/13 1344 08/03/13 1357  BP: 144/85 159/93  136/70  Pulse:    59  Temp:    97.5 F (36.4 C)  TempSrc:    Oral  Resp: 28 17    Height:      Weight:      SpO2: 97% 95% 100% 98%    Intake/Output Summary (Last 24 hours) at 08/03/13 2006 Last data filed at 08/03/13 1900  Gross per 24 hour  Intake   1120 ml  Output      0 ml  Net   1120 ml   Filed Weights   08/01/13 0502 08/02/13 0500 08/03/13 0456  Weight: 134.446 kg (296 lb 6.4 oz) 134.083 kg (295 lb 9.6 oz) 137.077 kg (302 lb 3.2 oz)    Exam:   General:  Obese AAF, No acute distress  HEENT:  NCAT, MMM  Cardiovascular:  RRR, nl S1, S2 no mrg, 2+ pulses, warm extremities  Respiratory:  Distant bilateral BS posteriorly, anterior sounds clear, no focal rales or rhonchi, no increased WOB  Abdomen:   NABS, soft, NT/ND  MSK:   Normal tone and bulk, no LEE  Neuro:  Grossly intact  Data Reviewed: Basic Metabolic Panel:  Recent Labs Lab 07/28/13 1950 07/29/13 0517 07/30/13 0645 07/31/13 0443 08/01/13 0445 08/02/13 0410 08/03/13 1420  NA 140 140 138 139 136* 137 137  K 4.0 4.0 3.8 3.6* 3.4* 4.2 3.7  CL 98 97 97 98 94* 95* 98  CO2 26 29 31 30 31 31 29   GLUCOSE 194* 207* 171* 131* 217* 221* 140*  BUN 11 11 10 10 15 23 13   CREATININE 0.88 0.82 0.72 0.77 0.85 1.14* 0.82  CALCIUM 9.0 8.9 8.9 8.9 8.7 8.7 8.5  MG  --  2.0  --   --   --   --   --   PHOS  --  3.2  --   --   --   --   --    Liver Function Tests:  Recent Labs Lab 07/28/13 1950 07/29/13 0517  AST 14 13  ALT 15 15  ALKPHOS 63 56  BILITOT <0.2* 0.2*  PROT 7.8 7.3  ALBUMIN 3.4* 3.3*    Recent Labs Lab 07/28/13 1950  LIPASE  15   No results found for this basename: AMMONIA,  in the last 168 hours CBC:  Recent Labs Lab 07/28/13 1907  07/30/13 0645 08/01/13 0445 08/02/13 0410 08/03/13 0615 08/03/13 1420  WBC 8.4  < > 9.5 10.4 10.5 QUESTIONABLE RESULTS, RECOMMEND RECOLLECT TO VERIFY 8.0  NEUTROABS 5.8  --   --   --   --   --   --   HGB 12.3  < > 11.2*  11.0* 10.4* QUESTIONABLE RESULTS, RECOMMEND RECOLLECT TO VERIFY 11.0*  HCT 37.9  < > 36.4 35.8* 33.7* QUESTIONABLE RESULTS, RECOMMEND RECOLLECT TO VERIFY 35.0*  MCV 81.3  < > 83.1 81.2 81.4 QUESTIONABLE RESULTS, RECOMMEND RECOLLECT TO VERIFY 81.2  PLT 325  < > 287 283 290 QUESTIONABLE RESULTS, RECOMMEND RECOLLECT TO VERIFY 294  < > = values in this interval not displayed. Cardiac Enzymes:  Recent Labs Lab 07/28/13 1950 07/31/13 1106 07/31/13 1712 08/01/13 0445  TROPONINI <0.30 <0.30 <0.30 <0.30   BNP (last 3 results)  Recent Labs  07/01/13 0235 07/31/13 0443 08/02/13 0410  PROBNP 1194.0* 1233.0* 406.0*   CBG:  Recent Labs Lab 08/03/13 0738 08/03/13 0902 08/03/13 1007 08/03/13 1212 08/03/13 1643  GLUCAP 117* 154* 143* 118* 140*    No results found for this or any previous visit (from the past 240 hour(s)).   Studies: No results found.  Scheduled Meds: . atorvastatin  10 mg Oral q1800  . carvedilol  25 mg Oral BID WC  . docusate sodium  100 mg Oral BID  . FLUoxetine  20 mg Oral Daily  . fluticasone  2 spray Each Nare Daily  . furosemide  40 mg Oral BID  . guaiFENesin  600 mg Oral BID  . insulin aspart  0-9 Units Subcutaneous TID WC  . insulin glargine  35 Units Subcutaneous QHS  . ipratropium-albuterol  3 mL Nebulization TID  . lisinopril  5 mg Oral Daily  . metoCLOPramide  10 mg Oral TID AC & HS  . pantoprazole  40 mg Oral Daily  . predniSONE  20 mg Oral QAC breakfast  . rivaroxaban  20 mg Oral Q supper  . senna  1 tablet Oral BID  . traZODone  100 mg Oral QHS   Continuous Infusions: . sodium chloride 20 mL/hr at  08/03/13 1029    Active Problems:   Essential hypertension   COPD (chronic obstructive pulmonary disease)   Intractable nausea and vomiting   Chronic diastolic congestive heart failure   Nausea & vomiting   Gastroparesis   Hx of pulmonary embolus   Diabetes mellitus   Hypoxia   Acute respiratory failure with hypoxia   COPD with acute exacerbation   Morbid obesity   Type II or unspecified type diabetes mellitus without mention of complication, not stated as uncontrolled    Time spent: 30 min    Olaf Mesa  [facility] Pager [phone]. If 7PM-7AM, please contact night-coverage at [email or web address], password [facility] 08/03/2013, 8:06 PM  LOS: 6 days

## 2013-08-03 NOTE — Op Note (Signed)
[facility] [place], [address]   COLONOSCOPY PROCEDURE REPORT  PATIENT: Tamara, Silva  MR#: [id number] BIRTHDATE: 1960/09/06 , 88  yrs. old GENDER: Female ENDOSCOPIST: Clarene Essex, MD REFERRED BY: PROCEDURE DATE:  08/03/2013 PROCEDURE:   Colonoscopy with snare polypectomy ASA CLASS:   Class III INDICATIONS:Constipation and Patient's immediate family history of colon cancer. MEDICATIONS: propofol (Diprivan) 300mg  IV   20 mg ketamine DESCRIPTION OF PROCEDURE:   After the risks benefits and alternatives of the procedure were thoroughly explained, informed consent was obtained.  The Pentax Colonoscope [id number]  endoscope was introduced through the anus and advanced to the terminal ileum which was intubated for a short distance , limited by No adverse events experienced.this did require abdominal pressure but no position changes   The quality of the prep was adequate. .  The instrument was then slowly withdrawn as the colon was fully examined.the findings are recorded below and the patient tolerated the procedure well there was no obvious immediate complication       FINDINGS:  1 a few sigmoid diverticuli small 2 a small splenic flexure and distal transverse polyp status post snare hot 3 otherwise within normal limits to the terminal ileum  COMPLICATIONS: none  IMPRESSION:  above  RECOMMENDATIONS: slowly advance diet daily once or twice MiraLax await pathology but probably repeat colonoscopy in 5 years and happy to see back in the office when necessary or in one to 2 months for followup and okay to restart blood thinners but reevaluate her actual needs and try to slowly wean her Reglan   _______________________________ eSigned:  Clarene Essex, MD 08/03/2013 8:48 AM   CC:

## 2013-08-03 NOTE — Progress Notes (Signed)
Hypoglycemic Event  CBG: 48  Treatment: D50 IV 50 mL  Symptoms: None  Follow-up CBG: Time: 0114am CBG Result:  119  Possible Reasons for Event: Inadequate meal intake (pt. NPO after midnight)  Comments/MD notified: Silva. Easterwood, NP    Tamara Silva  Remember to initiate Hypoglycemia Order Set & complete

## 2013-08-03 NOTE — Preoperative (Signed)
Beta Blockers   Reason not to administer Beta Blockers:Took Coreg at 1630 on 08/02/2013.

## 2013-08-03 NOTE — Progress Notes (Signed)
Tamara Silva 7:39 AM  Subjective: Patient in good spirits feeling well without nausea vomiting or abdominal pain  Objective: Vital signs stable afebrile exam please see pre-assessment evaluation  Assessment: Gastroparesis and increased constipation overdue for colon screening and family history of colon cancer  Plan: Okay to proceed with endoscopy colonoscopy with anesthesia assistance  [facility] E

## 2013-08-04 LAB — BASIC METABOLIC PANEL
BUN: 11 mg/dL (ref 6–23)
CALCIUM: 8.5 mg/dL (ref 8.4–10.5)
CO2: 29 mEq/L (ref 19–32)
Chloride: 102 mEq/L (ref 96–112)
Creatinine, Ser: 0.84 mg/dL (ref 0.50–1.10)
GFR calc Af Amer: >90 mL/min (ref >90)
GFR, EST NON AFRICAN AMERICAN: 78 mL/min — AB (ref >90)
Glucose, Bld: 118 mg/dL — ABNORMAL HIGH (ref 70–99)
Potassium: 3.7 mEq/L (ref 3.7–5.3)
Sodium: 142 mEq/L (ref 137–147)

## 2013-08-04 LAB — CBC
HCT: 35 % — ABNORMAL LOW (ref 36.0–46.0)
Hemoglobin: 11 g/dL — ABNORMAL LOW (ref 12.0–15.0)
MCH: 25.4 pg — AB (ref 26.0–34.0)
MCHC: 31.4 g/dL (ref 30.0–36.0)
MCV: 80.8 fL (ref 78.0–100.0)
PLATELETS: 299 10*3/uL (ref 150–400)
RBC: 4.33 MIL/uL (ref 3.87–5.11)
RDW: 16.3 % — ABNORMAL HIGH (ref 11.5–15.5)
WBC: 7.7 10*3/uL (ref 4.0–10.5)

## 2013-08-04 LAB — GLUCOSE, CAPILLARY
Glucose-Capillary: 146 mg/dL — ABNORMAL HIGH (ref 70–99)
Glucose-Capillary: 118 mg/dL — ABNORMAL HIGH (ref 70–99)
Glucose-Capillary: 120 mg/dL — ABNORMAL HIGH (ref 70–99)
Glucose-Capillary: 257 mg/dL — ABNORMAL HIGH (ref 70–99)

## 2013-08-04 MED ORDER — DSS 100 MG PO CAPS: 100.0000 mg | ORAL_CAPSULE | Freq: Two times a day (BID) | ORAL | Status: DC | PRN

## 2013-08-04 MED ORDER — INSULIN GLARGINE 100 UNIT/ML ~~LOC~~ SOLN: 35.0000 [IU] | Freq: Every day | SUBCUTANEOUS | Status: DC

## 2013-08-04 MED ORDER — PREDNISONE (PAK) 10 MG PO TABS: ORAL_TABLET | Freq: Every day | ORAL | Status: DC

## 2013-08-04 MED ORDER — SENNA 8.6 MG PO TABS: 1.0000 | ORAL_TABLET | Freq: Every day | ORAL | Status: DC | PRN

## 2013-08-04 MED ORDER — LISINOPRIL 5 MG PO TABS: 5.0000 mg | ORAL_TABLET | Freq: Every day | ORAL | Status: DC

## 2013-08-04 NOTE — Progress Notes (Signed)
SATURATION QUALIFICATIONS: (This note is used to comply with regulatory documentation for home oxygen)  Patient Saturations on Room Air at Rest = 99%  Patient Saturations on Room Air while Ambulating , up/down 10 steps= 95%  Patient Saturations on  Liters of oxygen while Ambulating = %= NT due to sats  > 88%  Please briefly explain why patient needs home oxygen:Pt does not qualify based on this assessment.  Tresa Endo PT [phone]

## 2013-08-04 NOTE — Evaluation (Signed)
Physical Therapy Evaluation Patient Details Name: Tamara Silva MRN: [id number] DOB: Oct 25, 1960 Today's Date: 08/04/2013 Time: [id number] PT Time Calculation (min): 22 min  PT Assessment / Plan / Recommendation History of Present Illness  53 y.o. female with past medical history of Diabetes mellitus without complication; PE (pulmonary embolism); Hypercholesterolemia; Hypertension; COPD (chronic obstructive pulmonary disease); Asthma; Coronary artery disease; CHF (congestive heart failure); admitted 2/10-2/16 for emesis and was diagnosed with gastroparesis she was discharged on reglan and was supposed to follow up her PCP due to snow she was not able to go and run out of her reglan at that point hr nausea and vomiting resumed. IN ER she was noted to be hypoxic with oxygen saturation down to 89% on RA. Patient denies being on oxygen at home but states she needs it. Patient states she has had hypoxic events while hospitalized last week. She had xh of diastolic CHF, COPD and PE in May 2014 still on Xarelto. She denies any significant coughing but had some minor wheezing which is her baseline. Patient had a CTA done in ER which showed no acute PE but evidence of pulmonary artery narrowing likely from old PE. She was admitted to the hospitalist service for further evaluation and management of severe gastroparesis. GI consulted for severe gastroparesis. Shew as started on reglan and erythromycin . She is scheduled for colonoscopy and EGD on Friday to evaluate other causes of gastroparesis and chronic constipation.    Clinical Impression  Asked to see pt for SAt monitoring on steps. . Pt  Tolerated well. Encouraged pt to pace her activities, instructed in pursed lip breathing. Pt could benefit from a ROLLATOR/4 WHEELED RW to have access to rest for longer distances.   PT Assessment  Patent does not need any further PT services    Follow Up Recommendations  No PT follow up    Does the patient have the  potential to tolerate intense rehabilitation      Barriers to Discharge        Equipment Recommendations  Other (comment)    Recommendations for Other Services     Frequency      Precautions / Restrictions Restrictions Weight Bearing Restrictions: No   Pertinent Vitals/Pain 99% ra pre  post 10 steps= 95%      Mobility  Bed Mobility Overal bed mobility: Independent Transfers Overall transfer level: Independent Ambulation/Gait Ambulation/Gait assistance: Modified independent (Device/Increase time) Ambulation Distance (Feet): 100 Feet Assistive device: Quad cane Gait Pattern/deviations: Step-through pattern Stairs: Yes Stairs assistance: Supervision Stair Management: Step to pattern;One rail Right;Forwards;With cane Number of Stairs: 10 General stair comments: pt on RA , no assist needed, rested x 1 minute at top before coming  down    Exercises Other Exercises Other Exercises: pursed lip breathing   PT Diagnosis:    PT Problem List:   PT Treatment Interventions:       PT Goals(Current goals can be found in the care plan section) Acute Rehab PT Goals PT Goal Formulation: No goals set, d/c therapy  Visit Information  Last PT Received On: 08/04/13 History of Present Illness: 53 y.o. female with past medical history of Diabetes mellitus without complication; PE (pulmonary embolism); Hypercholesterolemia; Hypertension; COPD (chronic obstructive pulmonary disease); Asthma; Coronary artery disease; CHF (congestive heart failure); admitted 2/10-2/16 for emesis and was diagnosed with gastroparesis she was discharged on reglan and was supposed to follow up her PCP due to snow she was not able to go and run out of her reglan at that  point hr nausea and vomiting resumed. IN ER she was noted to be hypoxic with oxygen saturation down to 89% on RA. Patient denies being on oxygen at home but states she needs it. Patient states she has had hypoxic events while hospitalized last week.  She had xh of diastolic CHF, COPD and PE in May 2014 still on Xarelto. She denies any significant coughing but had some minor wheezing which is her baseline. Patient had a CTA done in ER which showed no acute PE but evidence of pulmonary artery narrowing likely from old PE. She was admitted to the hospitalist service for further evaluation and management of severe gastroparesis. GI consulted for severe gastroparesis. Shew as started on reglan and erythromycin . She is scheduled for colonoscopy and EGD on Friday to evaluate other causes of gastroparesis and chronic constipation.         Prior [place] expects to be discharged to:: Private residence Living Arrangements: Other (Comment) Available Help at Discharge: Family Type of Home: Apartment Home Access: Level entry Home Layout: One level Home Equipment: Cane - quad Prior Function Level of Independence: Independent    Cognition  Cognition Arousal/Alertness: Awake/alert    Extremity/Trunk Assessment Upper Extremity Assessment Upper Extremity Assessment: Overall WFL for tasks assessed Lower Extremity Assessment Lower Extremity Assessment: Overall WFL for tasks assessed   Balance    End of Session PT - End of Session Activity Tolerance: Patient tolerated treatment well Patient left: in bed;with call bell/phone within reach Nurse Communication: Mobility status (sats)  GP     Claretha Cooper 08/04/2013, 11:39 AM Tresa Endo PT [phone]

## 2013-08-04 NOTE — Progress Notes (Signed)
Patient ID: Tamara Silva, female   DOB: 01/15/1961, 53 y.o.   MRN: [id number]  Patient sitting on side of bed in great spirits. Denies abdominal pain. Tolerating diet.  Reports that she is going home today.  Patient will be set up to see Dr. Watt Climes in f/u in 4 weeks and our office will arrange. Path will be f/u as outpt. Will sign off. Call if questions.

## 2013-08-05 NOTE — Discharge Summary (Signed)
Physician Discharge Summary  Tamara Silva [id number] DOB: 1960-07-21 DOA: 07/28/2013  PCP: Philis Fendt, MD  Admit date: 07/28/2013 Discharge date: 08/04/2013  Time spent: 30 minutes  Recommendations for Outpatient Follow-up:  1. Follow up with PCP in one week 2. Follow up with cardiology for outpatient stress test 3. Follow up with gastroenterology for biopsy results.   Discharge Diagnoses:  Active Problems:   Essential hypertension   COPD (chronic obstructive pulmonary disease)   Intractable nausea and vomiting   Chronic diastolic congestive heart failure   Nausea & vomiting   Gastroparesis   Hx of pulmonary embolus   Diabetes mellitus   Hypoxia   Acute respiratory failure with hypoxia   COPD with acute exacerbation   Morbid obesity   Type II or unspecified type diabetes mellitus without mention of complication, not stated as uncontrolled   Discharge Condition: improved.   Diet recommendation: carb modified diet.   Filed Weights   08/02/13 0500 08/03/13 0456 08/04/13 0538  Weight: 134.083 kg (295 lb 9.6 oz) 137.077 kg (302 lb 3.2 oz) 134.99 kg (297 lb 9.6 oz)    History of present illness:  Tamara Silva is a 53 y.o. female with past medical history of Diabetes mellitus without complication; PE (pulmonary embolism); Hypercholesterolemia; Hypertension; COPD (chronic obstructive pulmonary disease); Asthma; Coronary artery disease; CHF (congestive heart failure); admitted 2/10-2/16 for emesis and was diagnosed with gastroparesis she was discharged on reglan and was supposed to follow up her PCP due to snow she was not able to go and run out of her reglan at that point hr nausea and vomiting resumed. IN ER she was noted to be hypoxic with oxygen saturation down to 89% on RA. Patient denies being on oxygen at home but states she needs it. Patient states she has had hypoxic events while hospitalized last week. She had xh of diastolic CHF, COPD and PE in May 2014 still on  Xarelto. She denies any significant coughing but had some minor wheezing which is her baseline. Patient had a CTA done in ER which showed no acute PE but evidence of pulmonary artery narrowing likely from old PE. She was admitted to the hospitalist service for further evaluation and management of severe gastroparesis. GI consulted for severe gastroparesis. Shew as started on reglan and erythromycin . She is scheduled for colonoscopy and EGD on Friday to evaluate other causes of gastroparesis and chronic constipation. Colonoscopy showed few sigmoid diverticuli small 2 a small splenic  flexure and distal transverse polyp status post snare hot 3 otherwise within normal limits to the terminal ileum. EGD showed mild gastritis. Her symptoms resolved and she was started on carb modified diet and discharged home. She was also walked with PT on stairs and her oxygen sat's have remained greater than 90 %. No indication for oxygen.     Hospital Course:  Atypical chest pain, - resolved.  - negative troponins.    Constipation, worsening, hx of colon cancer in the family  - Possible colonoscopy later Friday  - TSH 0.708  - Continue stool softeners  Nausea without vomiting - most likely secondary to gastroparesis. She underwent EGD and colonoscopy today. EGD showed mild hernia, and colonoscopy showed polyps.  Continue with zofran and reglan and outpatient follow up for biopsy results.   Essential hypertension, uncontrolled  - continue carvedilol  - Start lisinopril   Acute on Chronic hypoxic respiratory failure likely multifactorial due to acute COPD exac, chronic diastolic heart failure, hx of PE, and pulmonary artery  stenosis.  Acute COPD (chronic obstructive pulmonary disease) exac with increased cough and clear sputum  - prednisone taper.    Acute on chronic diastolic congestive heart failure  - Continue lasix and her probnp has also improved from 1233 to 406.  -continue lasix on discharge.  Hx of  pulmonary embolus - continue xarelto, which will be held today for procedure in am, can be resumed after the procedure.  Pulmonary artery narrowing, may be contributing to resp failure   Diabetes mellitus, A1c 8.1, CBG well controlled  - Continue lantus at current dose   -  CBG (last 3)   Recent Labs   08/03/13 1007  08/03/13 1212  08/03/13 1643   GLUCAP  143*  118*  140*    Morbid obesity with probable OHS and/or OSA  - Outpatient sleep study  - Consideration of bariatric surgery  Normocytic anemia   - low ferritin, with low iron levels ,  - B12, folate adequate.  - will need iron supplementation which will further cause her constipation.    Procedures:  Colonoscopy  EGD  Consultations:  GI  Discharge Exam: Filed Vitals:   08/04/13 0529  BP: 126/69  Pulse: 66  Temp: 98.2 F (36.8 C)  Resp: 18      Discharge Instructions  Discharge Orders   Future Orders Complete By Expires   ([place]) Call MD:  Anytime you have any of the following symptoms: 1) 3 pound weight gain in 24 hours or 5 pounds in 1 week 2) shortness of breath, with or without a dry hacking cough 3) swelling in the hands, feet or stomach 4) if you have to sleep on extra pillows at night in order to breathe.  As directed    Call MD for:  persistant dizziness or light-headedness  As directed    Call MD for:  persistant nausea and vomiting  As directed    Call MD for:  temperature >100.4  As directed    Diet - low sodium heart healthy  As directed    Discharge instructions  As directed    Comments:     Follow up with GI as recommended  Follow up with PCP in one week Follow up with cardiology in 2 to 4 weeks.       Medication List         albuterol (2.5 MG/3ML) 0.083% nebulizer solution  Commonly known as:  PROVENTIL  Take 6 mLs (5 mg total) by nebulization every 4 (four) hours as needed for wheezing or shortness of breath.     albuterol 108 (90 BASE) MCG/ACT inhaler   Commonly known as:  PROVENTIL HFA;VENTOLIN HFA  Inhale 2 puffs into the lungs every 6 (six) hours as needed for wheezing.     carvedilol 25 MG tablet  Commonly known as:  COREG  Take 1 tablet (25 mg total) by mouth 2 (two) times daily with a meal.     diazepam 10 MG tablet  Commonly known as:  VALIUM  Take 0.5 tablets (5 mg total) by mouth every 8 (eight) hours as needed for anxiety.     DSS 100 MG Caps  Take 100 mg by mouth 2 (two) times daily as needed for mild constipation.     FLUoxetine 20 MG capsule  Commonly known as:  PROZAC  Take 1 capsule (20 mg total) by mouth daily.     fluticasone 50 MCG/ACT nasal spray  Commonly known as:  FLONASE  Place 2 sprays  into the nose every morning.     furosemide 40 MG tablet  Commonly known as:  LASIX  Take 40-80 mg by mouth 3 (three) times daily. Take 2 tabs in the morning. 1 tab at noon. 2 tabs in the evening.     insulin glargine 100 UNIT/ML injection  Commonly known as:  LANTUS  Inject 0.35 mLs (35 Units total) into the skin at bedtime.     insulin lispro 100 UNIT/ML injection  Commonly known as:  HUMALOG  Inject 0-20 Units into the skin 3 (three) times daily before meals. SSI based on BG     ipratropium-albuterol 0.5-2.5 (3) MG/3ML Soln  Commonly known as:  DUONEB  Take 3 mLs by nebulization every 6 (six) hours as needed (wheezing).     lisinopril 5 MG tablet  Commonly known as:  PRINIVIL,ZESTRIL  Take 1 tablet (5 mg total) by mouth daily.     metoCLOPramide 10 MG tablet  Commonly known as:  REGLAN  Take 1 tablet (10 mg total) by mouth 4 (four) times daily -  before meals and at bedtime.     nitroGLYCERIN 0.4 MG SL tablet  Commonly known as:  NITROSTAT  Place 1 tablet (0.4 mg total) under the tongue every 5 (five) minutes as needed for chest pain.     omeprazole 40 MG capsule  Commonly known as:  PRILOSEC  Take 40 mg by mouth 2 (two) times daily.     ondansetron 4 MG tablet  Commonly known as:  ZOFRAN  Take 1  tablet (4 mg total) by mouth 4 (four) times daily as needed for nausea or vomiting.     potassium chloride 10 MEQ tablet  Commonly known as:  K-DUR  Take 10 mEq by mouth 2 (two) times daily.     pravastatin 40 MG tablet  Commonly known as:  PRAVACHOL  Take 40 mg by mouth every evening.     predniSONE 10 MG tablet  Commonly known as:  STERAPRED UNI-PAK  - Take by mouth daily. Prednisone 20 mg daily for one more day followed by  - Prednisone 10 mg daily for 3 days and stop.     Rivaroxaban 20 MG Tabs tablet  Commonly known as:  XARELTO  Take 1 tablet (20 mg total) by mouth daily with supper.     senna 8.6 MG Tabs tablet  Commonly known as:  SENOKOT  Take 1 tablet (8.6 mg total) by mouth daily as needed for mild constipation.     tiZANidine 4 MG tablet  Commonly known as:  ZANAFLEX  Take 0.5 tablets (2 mg total) by mouth every 6 (six) hours as needed for muscle spasms.     traZODone 100 MG tablet  Commonly known as:  DESYREL  Take 100 mg by mouth at bedtime.       Allergies  Allergen Reactions  . Nsaids Other (See Comments)    "I do not take NSAIDs d/t interfering with other meds"  . Metolazone Nausea And Vomiting       Follow-up Information   Follow up with AVBUERE,EDWIN A, MD. Schedule an appointment as soon as possible for a visit in 1 week.   Specialty:  Internal Medicine   Contact information:   [address] [place] [address] [phone]       Follow up with [facility], MD. Schedule an appointment as soon as possible for a visit in 4 weeks.   Specialty:  Gastroenterology   Contact information:  [address], [place] [address] [phone]        The results of significant diagnostics from this hospitalization (including imaging, microbiology, ancillary and laboratory) are listed below for reference.    Significant Diagnostic Studies: Dg Chest 2 View  07/28/2013   CLINICAL DATA:  Chest pain  EXAM: CHEST  2 VIEW   COMPARISON:  06/29/2013  FINDINGS: Cardiomediastinal silhouette is stable. Stable scarring in left upper lobe. Chronic interstitial prominence and chronic mild bronchitic changes. No acute infiltrate or pulmonary edema. Bony thorax is stable.  IMPRESSION: No acute infiltrate or pulmonary edema. Stable chronic interstitial prominence and mild bronchitic changes. Again noted scarring in left upper lobe.   Electronically Signed   By: Lahoma Crocker M.D.   On: 07/28/2013 17:26   Ct Angio Chest Pe W/cm &/or Wo Cm  07/28/2013   CLINICAL DATA:  Hypoxia, prior PE.  EXAM: CT ANGIOGRAPHY CHEST WITH CONTRAST  TECHNIQUE: Multidetector CT imaging of the chest was performed using the standard protocol during bolus administration of intravenous contrast. Multiplanar CT image reconstructions and MIPs were obtained to evaluate the vascular anatomy.  CONTRAST:  151mL OMNIPAQUE IOHEXOL 350 MG/ML SOLN  COMPARISON:  CTA chest dated 01/23/2013  FINDINGS: Narrowing/occlusion of the right lower lobe pulmonary artery (series 10/image 133), favored to reflect sequela of prior/chronic pulmonary embolism. No findings to suggest acute pulmonary embolism.  Left upper lobe scarring with overlying calcified pleural plaques. Left lower lobe platelike scarring. Trace left pleural effusion. Mild right lower lobe atelectasis/scarring. Moderate paraseptal emphysematous changes. No pneumothorax.  Visualized thyroid is unremarkable.  Heart is mildly enlarged. Thinning involving the left ventricular apex, likely the sequela of prior MI. No pericardial effusion. Coronary atherosclerosis with percutaneous stent.  Small mediastinal lymph nodes, including a 10 mm short axis high right paratracheal node (series 4/image 17) and a 12 mm subcarinal node (series 4/image 39). No suspicious hilar or axillary lymphadenopathy.  Visualized upper abdomen is unremarkable.  Visualized osseous structures are within normal limits.  Review of the MIP images confirms the above  findings.  IMPRESSION: Narrowing/occlusion of the right lower lobe pulmonary artery, favored to reflect sequela of prior/chronic pulmonary embolism.  No findings to suggest acute pulmonary embolism.  Additional stable ancillary findings as above.   Electronically Signed   By: Julian Hy M.D.   On: 07/28/2013 23:03   Nm Gastric Emptying  07/13/2013   CLINICAL DATA:  Refractory nausea and vomiting; diabetes mellitus  EXAM: NUCLEAR MEDICINE GASTRIC EMPTYING SCAN  Views:  LAO stomach  Radionuclide: Technetium 49m sulfur colloid in egg  Dose:  2.2 mCi  Route of administration: Oral  COMPARISON:  None.  FINDINGS: Images and count statistics were obtained over the stomach following oral consumption of radiolabeled solid material.  After 1 hr, there was no emptying from the stomach. After 2 hr, 47% of solid material had emptied from the stomach. Normally, greater than 70% of solid material empties from the stomach after 2 hr. This study is regarded as abnormal.  IMPRESSION: Abnormal gastric emptying study for solid material. This finding suggests underlying gastric paresis. Differential diagnostic consideration is gastric outlet obstruction to solid material. Given the history of diabetes mellitus, gastric paresis is felt to be the most likely etiology for this abnormal finding.   Electronically Signed   By: Lowella Grip M.D.   On: 07/13/2013 15:39   Dg Chest Port 1 View  07/31/2013   CLINICAL DATA:  53 year old female chest pain, shortness of  breath. Cardiomyopathy. Initial encounter.  EXAM: PORTABLE CHEST - 1 VIEW  COMPARISON:  Chest CTA 07/28/2013 and earlier.  FINDINGS: Portable AP upright view at 1119 hrs. Stable lung volumes. Stable cardiomegaly and mediastinal contours. Pulmonary vascularity mildly increased compared to 07/28/2013. No pleural effusion, pneumothorax or consolidation. Visualized tracheal air column is within normal limits.  IMPRESSION: Increased pulmonary vascular congestion.   Otherwise stable chest.   Electronically Signed   By: Lars Pinks M.D.   On: 07/31/2013 11:38   Dg Abd 2 Views  07/10/2013   CLINICAL DATA:  Abdominal pain, nausea, vomiting  EXAM: ABDOMEN - 2 VIEW  COMPARISON:  Chest radiograph 06/29/2013 CT chest 01/23/2013  FINDINGS: Chronic interstitial prominence/scarring in the left mid lung is unchanged. No free intraperitoneal air is seen on the upright view. The bowel gas pattern is nonobstructive. No significant stool burden noted. Several pelvic phleboliths are seen. No definite urinary tract stones. No acute osseous abnormality.  IMPRESSION: Nonobstructive bowel gas pattern.   Electronically Signed   By: Curlene Dolphin M.D.   On: 07/10/2013 10:53    Microbiology: No results found for this or any previous visit (from the past 240 hour(s)).   Labs: Basic Metabolic Panel:  Recent Labs Lab 07/31/13 0443 08/01/13 0445 08/02/13 0410 08/03/13 1420 08/04/13 0534  NA 139 136* 137 137 142  K 3.6* 3.4* 4.2 3.7 3.7  CL 98 94* 95* 98 102  CO2 30 31 31 29 29   GLUCOSE 131* 217* 221* 140* 118*  BUN 10 15 23 13 11   CREATININE 0.77 0.85 1.14* 0.82 0.84  CALCIUM 8.9 8.7 8.7 8.5 8.5   Liver Function Tests: No results found for this basename: AST, ALT, ALKPHOS, BILITOT, PROT, ALBUMIN,  in the last 168 hours No results found for this basename: LIPASE, AMYLASE,  in the last 168 hours No results found for this basename: AMMONIA,  in the last 168 hours CBC:  Recent Labs Lab 08/01/13 0445 08/02/13 0410 08/03/13 0615 08/03/13 1420 08/04/13 0534  WBC 10.4 10.5 QUESTIONABLE RESULTS, RECOMMEND RECOLLECT TO VERIFY 8.0 7.7  HGB 11.0* 10.4* QUESTIONABLE RESULTS, RECOMMEND RECOLLECT TO VERIFY 11.0* 11.0*  HCT 35.8* 33.7* QUESTIONABLE RESULTS, RECOMMEND RECOLLECT TO VERIFY 35.0* 35.0*  MCV 81.2 81.4 QUESTIONABLE RESULTS, RECOMMEND RECOLLECT TO VERIFY 81.2 80.8  PLT 283 290 QUESTIONABLE RESULTS, RECOMMEND RECOLLECT TO VERIFY 294 299   Cardiac Enzymes:  Recent  Labs Lab 07/31/13 1106 07/31/13 1712 08/01/13 0445  TROPONINI <0.30 <0.30 <0.30   BNP: BNP (last 3 results)  Recent Labs  07/01/13 0235 07/31/13 0443 08/02/13 0410  PROBNP 1194.0* 1233.0* 406.0*   CBG:  Recent Labs Lab 08/03/13 2119 08/04/13 0106 08/04/13 0402 08/04/13 0730 08/04/13 1201  GLUCAP 146* 146* 118* 120* 257*       Signed:  Romuald Mccaslin  [facility] 08/05/2013, 6:20 PM

## 2013-08-06 ENCOUNTER — Encounter (HOSPITAL_COMMUNITY): Payer: Self-pay | Admitting: Gastroenterology

## 2013-08-08 NOTE — Progress Notes (Signed)
Refused offer of COPD gold card

## 2013-08-15 ENCOUNTER — Encounter (HOSPITAL_COMMUNITY): Payer: Self-pay | Admitting: Emergency Medicine

## 2013-08-15 ENCOUNTER — Encounter: Payer: Self-pay | Admitting: Physician Assistant

## 2013-08-15 ENCOUNTER — Emergency Department (HOSPITAL_COMMUNITY)
Admission: EM | Admit: 2013-08-15 | Discharge: 2013-08-15 | Discharge status: Against Medical Advice | Payer: Medicaid Other | Attending: Emergency Medicine | Admitting: Emergency Medicine

## 2013-08-15 ENCOUNTER — Ambulatory Visit (INDEPENDENT_AMBULATORY_CARE_PROVIDER_SITE_OTHER): Payer: Medicaid Other | Admitting: Physician Assistant

## 2013-08-15 VITALS — BP 110/70 | HR 78 | Ht 68.0 in | Wt 305.8 lb

## 2013-08-15 DIAGNOSIS — Z720 Tobacco use: Secondary | ICD-10-CM

## 2013-08-15 DIAGNOSIS — I2589 Other forms of chronic ischemic heart disease: Secondary | ICD-10-CM

## 2013-08-15 DIAGNOSIS — I10 Essential (primary) hypertension: Secondary | ICD-10-CM | POA: Insufficient documentation

## 2013-08-15 DIAGNOSIS — M545 Low back pain, unspecified: Secondary | ICD-10-CM | POA: Insufficient documentation

## 2013-08-15 DIAGNOSIS — I2510 Atherosclerotic heart disease of native coronary artery without angina pectoris: Secondary | ICD-10-CM

## 2013-08-15 DIAGNOSIS — R0602 Shortness of breath: Secondary | ICD-10-CM | POA: Insufficient documentation

## 2013-08-15 DIAGNOSIS — E119 Type 2 diabetes mellitus without complications: Secondary | ICD-10-CM | POA: Insufficient documentation

## 2013-08-15 DIAGNOSIS — G8929 Other chronic pain: Secondary | ICD-10-CM | POA: Insufficient documentation

## 2013-08-15 DIAGNOSIS — J4489 Other specified chronic obstructive pulmonary disease: Secondary | ICD-10-CM | POA: Insufficient documentation

## 2013-08-15 DIAGNOSIS — F17200 Nicotine dependence, unspecified, uncomplicated: Secondary | ICD-10-CM

## 2013-08-15 DIAGNOSIS — R079 Chest pain, unspecified: Secondary | ICD-10-CM

## 2013-08-15 DIAGNOSIS — I5032 Chronic diastolic (congestive) heart failure: Secondary | ICD-10-CM

## 2013-08-15 DIAGNOSIS — I255 Ischemic cardiomyopathy: Secondary | ICD-10-CM

## 2013-08-15 DIAGNOSIS — I509 Heart failure, unspecified: Secondary | ICD-10-CM

## 2013-08-15 DIAGNOSIS — J449 Chronic obstructive pulmonary disease, unspecified: Secondary | ICD-10-CM | POA: Insufficient documentation

## 2013-08-15 DIAGNOSIS — J45909 Unspecified asthma, uncomplicated: Secondary | ICD-10-CM | POA: Insufficient documentation

## 2013-08-15 NOTE — Patient Instructions (Signed)
1.  We will schedule a stress test. 2.  I will refer you to a dietician. 3.  Follow up in 3 Months.

## 2013-08-15 NOTE — ED Notes (Signed)
Called pt to take back to treatment room. No answer from lobby at this time. Will retry again.

## 2013-08-15 NOTE — Addendum Note (Signed)
Addended by: Dolan Amen on: 08/15/2013 02:51 PM   Modules accepted: Orders

## 2013-08-15 NOTE — Assessment & Plan Note (Signed)
Patient will be referred to dietitian.

## 2013-08-15 NOTE — Assessment & Plan Note (Signed)
Last 2-D echocardiogram was recent and showed ejection fraction of 35-40% and grade 2 diastolic dysfunction.

## 2013-08-15 NOTE — ED Notes (Signed)
Per EMS: pt c/o SOB and chronic lower back pain. Pt went to cardiologist this morning and told everything was fine. Pt requested to come here for second opinion. Pt ambulatory on seen, SOB with exertion.

## 2013-08-15 NOTE — Assessment & Plan Note (Signed)
Blood pressure well controlled

## 2013-08-15 NOTE — Progress Notes (Signed)
Date:  08/15/2013   ID:  Tamara Silva, DOB [birth year], MRN [id number]  PCP:  Philis Fendt, MD  Primary Cardiologist:  Gwenlyn Found    History of Present Illness: Tamara Silva is a 53 y.o. female who was recently hospitalized with gastroparesis.  She has a past medical history of Diabetes mellitus without complication; PE (pulmonary embolism)-Zaroxolyn was stopped; Hypercholesterolemia; Hypertension; COPD (chronic obstructive pulmonary disease); Asthma; Coronary artery disease; CHF (congestive heart failure); admitted 2/10-2/16 for emesis and was diagnosed with gastroparesis she was discharged on reglan and was supposed to follow up her PCP due to snow she was not able to go and run out of her reglan at that point hr nausea and vomiting resumed.  Patient had a CTA done in ER which showed no acute PE but evidence of pulmonary artery narrowing likely from old PE.   She was started on reglan and erythromycin.  Colonoscopy showed few sigmoid diverticuli small 2 a small splenic flexure and distal transverse polyp status post snare hot 3 otherwise within normal limits to the terminal ileum. EGD showed mild gastritis.  All patient was hospitalized she an episode of chest pain which apparently was relieved with sublingual nitroglycerin. She's had no episodes since.  Most recent 2-D echocardiogram was 07/01/2013 and revealed ejection fraction was in the range of 35% to 40%. There is mild hypokinesis of the mid-distalanteroseptal myocardium. Was also grade 2 diastolic dysfunction.   She is here today for followup. She's again had no further chest pain. She is requesting to start cardiac rehabilitation. She's taking all her medications as instructed. She does get some lower extremity edema.  The patient currently denies nausea, vomiting, fever, chest pain, shortness of breath, orthopnea, dizziness, PND, cough, congestion, abdominal pain, hematochezia, melena.  Wt Readings from Last 3 Encounters:  08/15/13 305  lb 12.8 oz (138.71 kg)  08/04/13 297 lb 9.6 oz (134.99 kg)  08/04/13 297 lb 9.6 oz (134.99 kg)     Past Medical History  Diagnosis Date  . Diabetes mellitus without complication   . PE (pulmonary embolism)   . Hypercholesterolemia   . Hypertension   . COPD (chronic obstructive pulmonary disease)   . Asthma   . Coronary artery disease   . CHF (congestive heart failure)   . Mental disorder     BIPOLAR  . Shortness of breath   . GERD (gastroesophageal reflux disease)   . Neuromuscular disorder     DIABETIC NEUROPATHY  . Ischemic cardiomyopathy   . Bipolar disorder   . Post-traumatic stress syndrome     Current Outpatient Prescriptions  Medication Sig Dispense Refill  . albuterol (PROVENTIL HFA;VENTOLIN HFA) 108 (90 BASE) MCG/ACT inhaler Inhale 2 puffs into the lungs every 6 (six) hours as needed for wheezing.      [name] Kitchen albuterol (PROVENTIL) (2.5 MG/3ML) 0.083% nebulizer solution Take 6 mLs (5 mg total) by nebulization every 4 (four) hours as needed for wheezing or shortness of breath.  75 mL  12  . carvedilol (COREG) 25 MG tablet Take 1 tablet (25 mg total) by mouth 2 (two) times daily with a meal.  60 tablet  1  . clopidogrel (PLAVIX) 75 MG tablet Take 75 mg by mouth daily with breakfast.      . diazepam (VALIUM) 10 MG tablet Take 0.5 tablets (5 mg total) by mouth every 8 (eight) hours as needed for anxiety.  30 tablet  0  . docusate sodium 100 MG CAPS Take 100 mg by mouth 2 (two)  times daily as needed for mild constipation.  10 capsule  0  . FLUoxetine (PROZAC) 20 MG capsule Take 1 capsule (20 mg total) by mouth daily.  30 capsule  3  . fluticasone (FLONASE) 50 MCG/ACT nasal spray Place 2 sprays into the nose every morning.       . furosemide (LASIX) 40 MG tablet Take 40-80 mg by mouth 3 (three) times daily. Take 2 tabs in the morning. 1 tab at noon. 2 tabs in the evening.      . insulin glargine (LANTUS) 100 UNIT/ML injection Inject 0.35 mLs (35 Units total) into the skin at  bedtime.  10 mL  11  . insulin lispro (HUMALOG) 100 UNIT/ML injection Inject 0-20 Units into the skin 3 (three) times daily before meals. SSI based on BG      . ipratropium-albuterol (DUONEB) 0.5-2.5 (3) MG/3ML SOLN Take 3 mLs by nebulization every 6 (six) hours as needed (wheezing).      [name] Kitchen lisinopril (PRINIVIL,ZESTRIL) 5 MG tablet Take 1 tablet (5 mg total) by mouth daily.  30 tablet  0  . metoCLOPramide (REGLAN) 10 MG tablet Take 1 tablet (10 mg total) by mouth 4 (four) times daily -  before meals and at bedtime.  120 tablet  0  . nitroGLYCERIN (NITROSTAT) 0.4 MG SL tablet Place 1 tablet (0.4 mg total) under the tongue every 5 (five) minutes as needed for chest pain.  100 tablet  3  . omeprazole (PRILOSEC) 40 MG capsule Take 40 mg by mouth 2 (two) times daily.      . potassium chloride (K-DUR) 10 MEQ tablet Take 10 mEq by mouth 2 (two) times daily.       . pravastatin (PRAVACHOL) 40 MG tablet Take 40 mg by mouth every evening.       . senna (SENOKOT) 8.6 MG TABS tablet Take 1 tablet (8.6 mg total) by mouth daily as needed for mild constipation.  30 each  0  . tiZANidine (ZANAFLEX) 4 MG tablet Take 0.5 tablets (2 mg total) by mouth every 6 (six) hours as needed for muscle spasms.  30 tablet  0  . traZODone (DESYREL) 100 MG tablet Take 100 mg by mouth at bedtime.      . ondansetron (ZOFRAN) 4 MG tablet Take 1 tablet (4 mg total) by mouth 4 (four) times daily as needed for nausea or vomiting.  20 tablet  0   No current facility-administered medications for this visit.    Allergies:    Allergies  Allergen Reactions  . Nsaids Other (See Comments)    "I do not take NSAIDs d/t interfering with other meds"  . Metolazone Nausea And Vomiting    Social History:  The patient  reports that she quit smoking about 8 months ago. Her smoking use included Cigarettes. She smoked 0.00 packs per day. She has never used smokeless tobacco. She reports that she does not drink alcohol or use illicit drugs.    Family history:   Family History  Problem Relation Age of Onset  . Stroke Mother   . Cancer Father     ROS:  Please see the history of present illness.  All other systems reviewed and negative.   PHYSICAL EXAM: VS:  BP 110/70  Pulse 78  Ht 5\' 8"  (1.727 m)  Wt 305 lb 12.8 oz (138.71 kg)  BMI 46.51 kg/m2 Morbidly obese, well developed, in no acute distress HEENT: Pupils are equal round react to light accommodation extraocular movements are intact.  Neck: No cervical lymphadenopathy. Cardiac: Regular rate and rhythm without murmurs rubs or gallops. Lungs:  clear to auscultation bilaterally, no wheezing, rhonchi or rales Abd: soft, nontender, positive bowel sounds all quadrants Ext: One plus lower extremity edema.  2+ radial and dorsalis pedis pulses. Skin: warm and dry Neuro:  Grossly normal    ASSESSMENT AND PLAN:  Problem List Items Addressed This Visit   Coronary artery disease   Tobacco abuse   Essential hypertension     Blood pressure well controlled    Cardiomyopathy, ischemic     Last 2-D echocardiogram was recent and showed ejection fraction of 35-40% and grade 2 diastolic dysfunction.      Chronic diastolic congestive heart failure     Patient appears to be mildly volume overloaded but is not complaining of any symptoms, however her weight has increased approximately 8 pounds. She is on Lasix 40-80 mg 3 times daily.  She is weighing herself daily. Continue current therapy.    Diabetes mellitus   Morbid obesity     Patient will be referred to dietitian.    Chest pain - Primary     Pain sounds noncardiac. Has not returned. Given her history of coronary artery disease and current risk factors we'll repeat a Lexiscan myovue.  The last one was maybe 2 years ago.    Relevant Orders      Myocardial Perfusion Imaging

## 2013-08-15 NOTE — Assessment & Plan Note (Signed)
Pain sounds noncardiac. Has not returned. Given her history of coronary artery disease and current risk factors we'll repeat a Lexiscan myovue.  The last one was maybe 2 years ago.

## 2013-08-15 NOTE — Assessment & Plan Note (Signed)
Patient appears to be mildly volume overloaded but is not complaining of any symptoms, however her weight has increased approximately 8 pounds. She is on Lasix 40-80 mg 3 times daily.  She is weighing herself daily. Continue current therapy.

## 2013-08-23 ENCOUNTER — Telehealth (HOSPITAL_COMMUNITY): Payer: Self-pay

## 2013-08-28 ENCOUNTER — Ambulatory Visit
Admission: RE | Admit: 2013-08-28 | Discharge: 2013-08-28 | Disposition: A | Discharge status: Home / Self Care | Payer: Medicaid Other | Source: Ambulatory Visit

## 2013-08-28 ENCOUNTER — Ambulatory Visit (HOSPITAL_COMMUNITY)
Admission: RE | Admit: 2013-08-28 | Discharge: 2013-08-28 | Disposition: A | Discharge status: Home / Self Care | Payer: Medicaid Other | Source: Ambulatory Visit | Attending: Cardiovascular Disease | Admitting: Cardiovascular Disease

## 2013-08-28 DIAGNOSIS — Z1231 Encounter for screening mammogram for malignant neoplasm of breast: Secondary | ICD-10-CM

## 2013-08-28 DIAGNOSIS — R079 Chest pain, unspecified: Secondary | ICD-10-CM | POA: Insufficient documentation

## 2013-08-28 MED ORDER — AMINOPHYLLINE 25 MG/ML IV SOLN
75.0000 mg | Freq: Once | INTRAVENOUS | Status: AC
  Administered 2013-08-28: 75 mg via INTRAVENOUS

## 2013-08-28 MED ORDER — TECHNETIUM TC 99M SESTAMIBI GENERIC - CARDIOLITE
30.2000 | Freq: Once | INTRAVENOUS | Status: AC | PRN
  Administered 2013-08-28: 30.2 via INTRAVENOUS

## 2013-08-28 MED ORDER — REGADENOSON 0.4 MG/5ML IV SOLN
0.4000 mg | Freq: Once | INTRAVENOUS | Status: AC
  Administered 2013-08-28: 0.4 mg via INTRAVENOUS

## 2013-08-28 NOTE — Procedures (Addendum)
[place] [facility] [address]  [address] [place] [address]  [id number]  Cardiology Nuclear Med Study  Tamara Silva is a 53 y.o. female MRN : [id number] DOB: 02-Aug-1960  Procedure Date: 08/28/2013  Nuclear Med Background  Indication for Stress Test: Stent Patency and Post Hospital  History: Asthma, COPD and CAD;MI-2006 AND 2009;STENT/PTCA-2009;CHF;ischemic cardiomyopathy;No prior NUC study for comparrison;ECHO on 07/01/2013-EF=35-40%  Cardiac Risk Factors: Family History - CAD, History of Smoking, Hypertension, IDDM Type 2, Lipids and Obesity  Symptoms: Chest Pain, DOE and SOB  Nuclear Pre-Procedure  Caffeine/Decaff Intake: 1:00am  NPO After: 11am   IV Site: R Hand  IV 0.9% NS with Angio Cath: 22g   Chest Size (in): n/a  IV Started by: Azucena Cecil, RN   Height: 5\' 8"  (1.727 m)  Cup Size: DDD   BMI: Body mass index is 46.39 kg/(m^2).  Weight: 305 lb (138.347 kg)    Tech Comments: n/a   Nuclear Med Study  1 or 2 day study: 1 day  Stress Test Type: [place] Provider: Quay Burow, MD    Resting Radionuclide: Technetium 78m Sestamibi  Resting Radionuclide Dose: 31.0 mCi   Stress Radionuclide: Technetium 10m Sestamibi  Stress Radionuclide Dose: 30.2 mCi   Stress Protocol  Rest HR: 62  Stress HR: 83   Rest BP: 137/86  Stress BP: 155/83   Exercise Time (min): n/a  METS: n/a    Dose of Adenosine (mg): n/a  Dose of Lexiscan: 0.4 mg   Dose of Atropine (mg): n/a  Dose of Dobutamine: n/a mcg/kg/min (at max HR)   Stress Test Technologist: Mellody Memos, CCT  Nuclear Technologist: Imagene Riches, CNMT   Rest Procedure: Myocardial perfusion imaging was performed at rest 45 minutes following the intravenous administration of Technetium 80m Sestamibi.  Stress Procedure: The patient received IV Lexiscan 0.4 mg over 15-seconds. Technetium 61m Sestamibi injected at 30-seconds. Patient experienced shortness of breath and was administered 75 mg  of Aminophylline IV at 5 minutes. There were no significant changes with Lexiscan. Quantitative spect images were obtained after a 45 minute delay.  Transient Ischemic Dilatation (Normal <1.22): 1.23  Lung/Heart Ratio (Normal <0.45): 0.20  QGS EDV: 248 ml  QGS ESV: 176 ml  LV Ejection Fraction: 28%  Rest ECG: NSR, old anterior MI  Stress ECG: No significant change from baseline ECG  QPS  Raw Data Images: Marked cardiomegaly, good quality images  Stress Images: Severely reduced uptake in the apex, mid distal anterior and anteroseptal walls  Rest Images: Comparison with the stress images reveals no significant change.  Subtraction (SDS): There is a fixed defect that is most consistent with a previous infarction.  LV Wall Motion: Severely reduced systolic function, apical dyskinesis  Impression  Exercise Capacity: Lexiscan with no exercise.  BP Response: Normal blood pressure response.  Clinical Symptoms: No significant symptoms noted.  ECG Impression: No significant ECG changes with Lexiscan.  Comparison with Prior Nuclear Study: No previous nuclear study performed  Overall Impression: Abnormal nuclear stress test consistent with extensive scar in the LAD artery distribution, adverse ventricular remodeling and severe systolic dysfunction. No reversible ischemia is seen.  Sanda Klein, MD  08/29/2013 5:56 PM

## 2013-08-29 ENCOUNTER — Ambulatory Visit (HOSPITAL_COMMUNITY)
Admission: RE | Admit: 2013-08-29 | Discharge: 2013-08-29 | Disposition: A | Discharge status: Home / Self Care | Payer: Medicaid Other | Source: Ambulatory Visit | Attending: Cardiovascular Disease | Admitting: Cardiovascular Disease

## 2013-08-29 DIAGNOSIS — R079 Chest pain, unspecified: Secondary | ICD-10-CM

## 2013-08-29 MED ORDER — TECHNETIUM TC 99M SESTAMIBI GENERIC - CARDIOLITE
31.0000 | Freq: Once | INTRAVENOUS | Status: AC | PRN
  Administered 2013-08-29: 31 via INTRAVENOUS

## 2013-08-29 NOTE — Procedures (Signed)
 [facility] [address]  [address] [place] [address]  [id number]  Cardiology Nuclear Med Study  Tamara Silva is a 53 y.o. female MRN : [id number] DOB: 1960-06-06  Procedure Date: 08/28/2013  Nuclear Med Background  Indication for Stress Test: Stent Patency and Post Hospital  History: Asthma, COPD and CAD;MI-2006 AND 2009;STENT/PTCA-2009;CHF;ischemic cardiomyopathy;No prior NUC study for comparrison;ECHO on 07/01/2013-EF=35-40%  Cardiac Risk Factors: Family History - CAD, History of Smoking, Hypertension, IDDM Type 2, Lipids and Obesity  Symptoms: Chest Pain, DOE and SOB  Nuclear Pre-Procedure  Caffeine/Decaff Intake: 1:00am  NPO After: 11am   IV Site: R Hand  IV 0.9% NS with Angio Cath: 22g   Chest Size (in): n/a  IV Started by: Azucena Cecil, RN   Height: 5\' 8"  (1.727 m)  Cup Size: DDD   BMI: Body mass index is 46.39 kg/(m^2).  Weight: 305 lb (138.347 kg)    Tech Comments: n/a   Nuclear Med Study  1 or 2 day study: 1 day  Stress Test Type: [place] Provider: Quay Burow, MD    Resting Radionuclide: Technetium 61m Sestamibi  Resting Radionuclide Dose: 31.0 mCi   Stress Radionuclide: Technetium 24m Sestamibi  Stress Radionuclide Dose: 30.2 mCi   Stress Protocol  Rest HR: 62  Stress HR: 83   Rest BP: 137/86  Stress BP: 155/83   Exercise Time (min): n/a  METS: n/a      Dose of Adenosine (mg): n/a  Dose of Lexiscan: 0.4 mg   Dose of Atropine (mg): n/a  Dose of Dobutamine: n/a mcg/kg/min (at max HR)   Stress Test Technologist: Mellody Memos, CCT  Nuclear Technologist: Imagene Riches, CNMT   Rest Procedure: Myocardial perfusion imaging was performed at rest 45 minutes following the intravenous administration of Technetium 57m Sestamibi.  Stress Procedure: The patient received IV Lexiscan 0.4 mg over 15-seconds. Technetium 35m Sestamibi injected at 30-seconds. Patient experienced shortness of breath and was administered 75  mg of Aminophylline IV at 5 minutes. There were no significant changes with Lexiscan. Quantitative spect images were obtained after a 45 minute delay.  Transient Ischemic Dilatation (Normal <1.22): 1.23  Lung/Heart Ratio (Normal <0.45): 0.20  QGS EDV: 248 ml  QGS ESV: 176 ml  LV Ejection Fraction: 28%     Rest ECG: NSR, old anterior MI  Stress ECG: No significant change from baseline ECG  QPS Raw Data Images:  Marked cardiomegaly, good quality images Stress Images:  Severely reduced uptake in the apex, mid distal anterior and anteroseptal walls Rest Images:  Comparison with the stress images reveals no significant change. Subtraction (SDS):  There is a fixed defect that is most consistent with a previous infarction. LV Wall Motion:  Severely reduced systolic function, apical dyskinesis  Impression Exercise Capacity:  Lexiscan with no exercise. BP Response:  Normal blood pressure response. Clinical Symptoms:  No significant symptoms noted. ECG Impression:  No significant ECG changes with Lexiscan. Comparison with Prior Nuclear Study: No previous nuclear study performed   Overall Impression:  Abnormal nuclear stress test consistent with extensive scar in the LAD artery distribution, adverse ventricular remodeling and severe systolic dysfunction. No reversible ischemia is seen.   Sanda Klein, MD  08/29/2013 5:56 PM

## 2013-10-01 NOTE — Telephone Encounter (Signed)
Encounter Closed---10/01/13 TP 

## 2013-10-05 ENCOUNTER — Encounter: Payer: Self-pay | Admitting: Cardiovascular Disease

## 2013-10-05 ENCOUNTER — Telehealth: Payer: Self-pay | Admitting: Cardiovascular Disease

## 2013-10-06 ENCOUNTER — Inpatient Hospital Stay (HOSPITAL_COMMUNITY)
Admission: EM | Admit: 2013-10-06 | Discharge: 2013-10-09 | DRG: 247 | Disposition: A | Discharge status: Home / Self Care | Payer: Medicaid Other | Source: Ambulatory Visit | Attending: Cardiovascular Disease | Admitting: Cardiovascular Disease

## 2013-10-06 DIAGNOSIS — F4310 Post-traumatic stress disorder, unspecified: Secondary | ICD-10-CM | POA: Diagnosis present

## 2013-10-06 DIAGNOSIS — D649 Anemia, unspecified: Secondary | ICD-10-CM | POA: Diagnosis present

## 2013-10-06 DIAGNOSIS — I2589 Other forms of chronic ischemic heart disease: Secondary | ICD-10-CM | POA: Diagnosis present

## 2013-10-06 DIAGNOSIS — Z9861 Coronary angioplasty status: Secondary | ICD-10-CM

## 2013-10-06 DIAGNOSIS — R0902 Hypoxemia: Secondary | ICD-10-CM | POA: Diagnosis present

## 2013-10-06 DIAGNOSIS — Z823 Family history of stroke: Secondary | ICD-10-CM

## 2013-10-06 DIAGNOSIS — I5042 Chronic combined systolic (congestive) and diastolic (congestive) heart failure: Secondary | ICD-10-CM | POA: Diagnosis present

## 2013-10-06 DIAGNOSIS — F319 Bipolar disorder, unspecified: Secondary | ICD-10-CM

## 2013-10-06 DIAGNOSIS — F17200 Nicotine dependence, unspecified, uncomplicated: Secondary | ICD-10-CM | POA: Diagnosis present

## 2013-10-06 DIAGNOSIS — I2510 Atherosclerotic heart disease of native coronary artery without angina pectoris: Secondary | ICD-10-CM

## 2013-10-06 DIAGNOSIS — Z794 Long term (current) use of insulin: Secondary | ICD-10-CM

## 2013-10-06 DIAGNOSIS — E785 Hyperlipidemia, unspecified: Secondary | ICD-10-CM

## 2013-10-06 DIAGNOSIS — I255 Ischemic cardiomyopathy: Secondary | ICD-10-CM

## 2013-10-06 DIAGNOSIS — T82897A Other specified complication of cardiac prosthetic devices, implants and grafts, initial encounter: Secondary | ICD-10-CM | POA: Diagnosis present

## 2013-10-06 DIAGNOSIS — Z720 Tobacco use: Secondary | ICD-10-CM

## 2013-10-06 DIAGNOSIS — Z6841 Body Mass Index (BMI) 40.0 and over, adult: Secondary | ICD-10-CM

## 2013-10-06 DIAGNOSIS — Z888 Allergy status to other drugs, medicaments and biological substances status: Secondary | ICD-10-CM

## 2013-10-06 DIAGNOSIS — J449 Chronic obstructive pulmonary disease, unspecified: Secondary | ICD-10-CM

## 2013-10-06 DIAGNOSIS — E1149 Type 2 diabetes mellitus with other diabetic neurological complication: Secondary | ICD-10-CM | POA: Diagnosis present

## 2013-10-06 DIAGNOSIS — I10 Essential (primary) hypertension: Secondary | ICD-10-CM | POA: Diagnosis present

## 2013-10-06 DIAGNOSIS — Z955 Presence of coronary angioplasty implant and graft: Secondary | ICD-10-CM

## 2013-10-06 DIAGNOSIS — K219 Gastro-esophageal reflux disease without esophagitis: Secondary | ICD-10-CM | POA: Diagnosis present

## 2013-10-06 DIAGNOSIS — I2119 ST elevation (STEMI) myocardial infarction involving other coronary artery of inferior wall: Principal | ICD-10-CM | POA: Diagnosis present

## 2013-10-06 DIAGNOSIS — I509 Heart failure, unspecified: Secondary | ICD-10-CM | POA: Diagnosis present

## 2013-10-06 DIAGNOSIS — Z7902 Long term (current) use of antithrombotics/antiplatelets: Secondary | ICD-10-CM

## 2013-10-06 DIAGNOSIS — Y831 Surgical operation with implant of artificial internal device as the cause of abnormal reaction of the patient, or of later complication, without mention of misadventure at the time of the procedure: Secondary | ICD-10-CM | POA: Diagnosis present

## 2013-10-06 DIAGNOSIS — I498 Other specified cardiac arrhythmias: Secondary | ICD-10-CM | POA: Diagnosis present

## 2013-10-06 DIAGNOSIS — J4489 Other specified chronic obstructive pulmonary disease: Secondary | ICD-10-CM | POA: Diagnosis present

## 2013-10-06 DIAGNOSIS — I959 Hypotension, unspecified: Secondary | ICD-10-CM | POA: Diagnosis not present

## 2013-10-06 DIAGNOSIS — E119 Type 2 diabetes mellitus without complications: Secondary | ICD-10-CM

## 2013-10-06 DIAGNOSIS — I213 ST elevation (STEMI) myocardial infarction of unspecified site: Secondary | ICD-10-CM

## 2013-10-06 DIAGNOSIS — E1142 Type 2 diabetes mellitus with diabetic polyneuropathy: Secondary | ICD-10-CM | POA: Diagnosis present

## 2013-10-07 ENCOUNTER — Encounter (HOSPITAL_COMMUNITY): Payer: Self-pay | Admitting: Emergency Medicine

## 2013-10-07 ENCOUNTER — Encounter (HOSPITAL_COMMUNITY)
Admission: EM | Disposition: A | Discharge status: Home / Self Care | Payer: Medicaid Other | Source: Ambulatory Visit | Attending: Cardiovascular Disease

## 2013-10-07 ENCOUNTER — Ambulatory Visit (HOSPITAL_COMMUNITY): Admit: 2013-10-07 | Payer: Self-pay | Admitting: Cardiology

## 2013-10-07 DIAGNOSIS — I2510 Atherosclerotic heart disease of native coronary artery without angina pectoris: Secondary | ICD-10-CM

## 2013-10-07 DIAGNOSIS — Z9861 Coronary angioplasty status: Secondary | ICD-10-CM

## 2013-10-07 DIAGNOSIS — I2589 Other forms of chronic ischemic heart disease: Secondary | ICD-10-CM

## 2013-10-07 DIAGNOSIS — I219 Acute myocardial infarction, unspecified: Secondary | ICD-10-CM

## 2013-10-07 DIAGNOSIS — I5042 Chronic combined systolic (congestive) and diastolic (congestive) heart failure: Secondary | ICD-10-CM

## 2013-10-07 DIAGNOSIS — I509 Heart failure, unspecified: Secondary | ICD-10-CM

## 2013-10-07 DIAGNOSIS — I059 Rheumatic mitral valve disease, unspecified: Secondary | ICD-10-CM

## 2013-10-07 DIAGNOSIS — I2119 ST elevation (STEMI) myocardial infarction involving other coronary artery of inferior wall: Principal | ICD-10-CM

## 2013-10-07 HISTORY — PX: LEFT HEART CATHETERIZATION WITH CORONARY ANGIOGRAM: SHX5451

## 2013-10-07 HISTORY — PX: CORONARY ANGIOPLASTY WITH STENT PLACEMENT: SHX49

## 2013-10-07 LAB — BASIC METABOLIC PANEL
BUN: 14 mg/dL (ref 6–23)
BUN: 15 mg/dL (ref 6–23)
CO2: 29 mEq/L (ref 19–32)
CO2: 26 mEq/L (ref 19–32)
Calcium: 8.2 mg/dL — ABNORMAL LOW (ref 8.4–10.5)
Calcium: 8.2 mg/dL — ABNORMAL LOW (ref 8.4–10.5)
Chloride: 105 mEq/L (ref 96–112)
Chloride: 100 mEq/L (ref 96–112)
Creatinine, Ser: 1.26 mg/dL — ABNORMAL HIGH (ref 0.50–1.10)
Creatinine, Ser: 1.06 mg/dL (ref 0.50–1.10)
GFR, EST AFRICAN AMERICAN: 55 mL/min — AB (ref >90)
GFR, EST AFRICAN AMERICAN: 68 mL/min — AB (ref >90)
GFR, EST NON AFRICAN AMERICAN: 48 mL/min — AB (ref >90)
GFR, EST NON AFRICAN AMERICAN: 59 mL/min — AB (ref >90)
GLUCOSE: 151 mg/dL — AB (ref 70–99)
Glucose, Bld: 159 mg/dL — ABNORMAL HIGH (ref 70–99)
POTASSIUM: 3.8 meq/L (ref 3.7–5.3)
Potassium: 4.2 mEq/L (ref 3.7–5.3)
SODIUM: 144 meq/L (ref 137–147)
Sodium: 138 mEq/L (ref 137–147)

## 2013-10-07 LAB — CBC
HCT: 32 % — ABNORMAL LOW (ref 36.0–46.0)
HCT: 34.5 % — ABNORMAL LOW (ref 36.0–46.0)
Hemoglobin: 10 g/dL — ABNORMAL LOW (ref 12.0–15.0)
Hemoglobin: 10.6 g/dL — ABNORMAL LOW (ref 12.0–15.0)
MCH: 24.4 pg — ABNORMAL LOW (ref 26.0–34.0)
MCH: 24.9 pg — ABNORMAL LOW (ref 26.0–34.0)
MCHC: 30.7 g/dL (ref 30.0–36.0)
MCHC: 31.3 g/dL (ref 30.0–36.0)
MCV: 79.5 fL (ref 78.0–100.0)
MCV: 79.6 fL (ref 78.0–100.0)
PLATELETS: 247 10*3/uL (ref 150–400)
Platelets: 292 10*3/uL (ref 150–400)
RBC: 4.02 MIL/uL (ref 3.87–5.11)
RBC: 4.34 MIL/uL (ref 3.87–5.11)
RDW: 16.8 % — ABNORMAL HIGH (ref 11.5–15.5)
RDW: 16.8 % — ABNORMAL HIGH (ref 11.5–15.5)
WBC: 10.1 10*3/uL (ref 4.0–10.5)
WBC: 8.9 10*3/uL (ref 4.0–10.5)

## 2013-10-07 LAB — DIFFERENTIAL
Basophils Absolute: 0.1 10*3/uL (ref 0.0–0.1)
Basophils Relative: 1 % (ref 0–1)
EOS PCT: 2 % (ref 0–5)
Eosinophils Absolute: 0.2 10*3/uL (ref 0.0–0.7)
LYMPHS ABS: 3.7 10*3/uL (ref 0.7–4.0)
LYMPHS PCT: 41 % (ref 12–46)
Monocytes Absolute: 0.7 10*3/uL (ref 0.1–1.0)
Monocytes Relative: 7 % (ref 3–12)
NEUTROS ABS: 4.4 10*3/uL (ref 1.7–7.7)
Neutrophils Relative %: 49 % (ref 43–77)

## 2013-10-07 LAB — GLUCOSE, CAPILLARY
GLUCOSE-CAPILLARY: 221 mg/dL — AB (ref 70–99)
Glucose-Capillary: 166 mg/dL — ABNORMAL HIGH (ref 70–99)
Glucose-Capillary: 203 mg/dL — ABNORMAL HIGH (ref 70–99)
Glucose-Capillary: 260 mg/dL — ABNORMAL HIGH (ref 70–99)

## 2013-10-07 LAB — PROTIME-INR
INR: 1.17 (ref 0.00–1.49)
PROTHROMBIN TIME: 14.7 s (ref 11.6–15.2)

## 2013-10-07 LAB — LIPID PANEL
CHOLESTEROL: 139 mg/dL (ref 0–200)
HDL: 54 mg/dL (ref >39)
LDL Cholesterol: 67 mg/dL (ref 0–99)
Total CHOL/HDL Ratio: 2.6 RATIO
Triglycerides: 92 mg/dL (ref <150)
VLDL: 18 mg/dL (ref 0–40)

## 2013-10-07 LAB — MRSA PCR SCREENING: MRSA BY PCR: NEGATIVE

## 2013-10-07 LAB — CK TOTAL AND CKMB (NOT AT ARMC)
CK TOTAL: 140 U/L (ref 7–177)
CK, MB: 2.9 ng/mL (ref 0.3–4.0)
Relative Index: 2.1 (ref 0.0–2.5)

## 2013-10-07 LAB — APTT: APTT: 197 s — AB (ref 24–37)

## 2013-10-07 LAB — TROPONIN I
Troponin I: <0.3 ng/mL (ref <0.30)
Troponin I: 1 ng/mL (ref <0.30)

## 2013-10-07 LAB — PLATELET INHIBITION P2Y12: PLATELET FUNCTION P2Y12: 297 [PRU] (ref 194–418)

## 2013-10-07 SURGERY — LEFT HEART CATHETERIZATION WITH CORONARY ANGIOGRAM
Anesthesia: LOCAL

## 2013-10-07 MED ORDER — LISINOPRIL 5 MG PO TABS
5.0000 mg | ORAL_TABLET | Freq: Every day | ORAL | Status: DC
  Filled 2013-10-07: qty 1

## 2013-10-07 MED ORDER — PERFLUTREN LIPID MICROSPHERE
INTRAVENOUS | Status: AC
Start: 2013-10-07 — End: 2013-10-07
  Administered 2013-10-07: 2 mL
  Filled 2013-10-07: qty 10

## 2013-10-07 MED ORDER — BIVALIRUDIN 250 MG IV SOLR
INTRAVENOUS | Status: AC
  Filled 2013-10-07: qty 250

## 2013-10-07 MED ORDER — HEPARIN (PORCINE) IN NACL 2-0.9 UNIT/ML-% IJ SOLN
INTRAMUSCULAR | Status: AC
  Filled 2013-10-07: qty 1000

## 2013-10-07 MED ORDER — INSULIN ASPART 100 UNIT/ML ~~LOC~~ SOLN
0.0000 [IU] | Freq: Three times a day (TID) | SUBCUTANEOUS | Status: DC
  Administered 2013-10-07: 4 [IU] via SUBCUTANEOUS
  Administered 2013-10-07 (×2): 7 [IU] via SUBCUTANEOUS
  Administered 2013-10-08: 3 [IU] via SUBCUTANEOUS
  Administered 2013-10-08: 7 [IU] via SUBCUTANEOUS

## 2013-10-07 MED ORDER — VERAPAMIL HCL 2.5 MG/ML IV SOLN
INTRAVENOUS | Status: AC
  Filled 2013-10-07: qty 2

## 2013-10-07 MED ORDER — DIAZEPAM 5 MG PO TABS: 5.0000 mg | ORAL_TABLET | Freq: Three times a day (TID) | ORAL | Status: DC | PRN

## 2013-10-07 MED ORDER — INSULIN ASPART 100 UNIT/ML ~~LOC~~ SOLN
12.0000 [IU] | Freq: Once | SUBCUTANEOUS | Status: AC
  Administered 2013-10-07: 12 [IU] via SUBCUTANEOUS

## 2013-10-07 MED ORDER — MIDAZOLAM HCL 2 MG/2ML IJ SOLN
INTRAMUSCULAR | Status: AC
  Filled 2013-10-07: qty 2

## 2013-10-07 MED ORDER — FLUTICASONE PROPIONATE 50 MCG/ACT NA SUSP
2.0000 | Freq: Every morning | NASAL | Status: DC
  Administered 2013-10-07 – 2013-10-09 (×3): 2 via NASAL
  Filled 2013-10-07: qty 16

## 2013-10-07 MED ORDER — PERFLUTREN LIPID MICROSPHERE
1.0000 mL | INTRAVENOUS | Status: AC | PRN
  Filled 2013-10-07: qty 10

## 2013-10-07 MED ORDER — SODIUM CHLORIDE 0.9 % IV SOLN
1.7500 mg/kg/h | INTRAVENOUS | Status: AC
  Administered 2013-10-07: 1.75 mg/kg/h via INTRAVENOUS
  Filled 2013-10-07: qty 250

## 2013-10-07 MED ORDER — HEPARIN SODIUM (PORCINE) 5000 UNIT/ML IJ SOLN
INTRAMUSCULAR | Status: AC
  Filled 2013-10-07: qty 1

## 2013-10-07 MED ORDER — TRAZODONE HCL 100 MG PO TABS
100.0000 mg | ORAL_TABLET | Freq: Every day | ORAL | Status: DC
  Administered 2013-10-07 – 2013-10-08 (×2): 100 mg via ORAL
  Filled 2013-10-07 (×4): qty 1

## 2013-10-07 MED ORDER — SODIUM CHLORIDE 0.9 % IJ SOLN
3.0000 mL | Freq: Two times a day (BID) | INTRAMUSCULAR | Status: DC
  Administered 2013-10-07 – 2013-10-08 (×3): 3 mL via INTRAVENOUS

## 2013-10-07 MED ORDER — ATROPINE SULFATE 0.1 MG/ML IJ SOLN
INTRAMUSCULAR | Status: AC
  Filled 2013-10-07: qty 10

## 2013-10-07 MED ORDER — SODIUM CHLORIDE 0.9 % IV SOLN: 250.0000 mL | INTRAVENOUS | Status: DC | PRN

## 2013-10-07 MED ORDER — FUROSEMIDE 10 MG/ML IJ SOLN
40.0000 mg | Freq: Once | INTRAMUSCULAR | Status: AC
  Administered 2013-10-07: 40 mg via INTRAVENOUS
  Filled 2013-10-07: qty 4

## 2013-10-07 MED ORDER — INSULIN GLARGINE 100 UNIT/ML ~~LOC~~ SOLN
35.0000 [IU] | Freq: Every day | SUBCUTANEOUS | Status: DC
  Administered 2013-10-07 – 2013-10-08 (×2): 35 [IU] via SUBCUTANEOUS
  Filled 2013-10-07 (×4): qty 0.35

## 2013-10-07 MED ORDER — NITROGLYCERIN 0.2 MG/ML ON CALL CATH LAB
INTRAVENOUS | Status: AC
  Filled 2013-10-07: qty 1

## 2013-10-07 MED ORDER — ATORVASTATIN CALCIUM 40 MG PO TABS
40.0000 mg | ORAL_TABLET | Freq: Every day | ORAL | Status: DC
  Administered 2013-10-07 – 2013-10-08 (×2): 40 mg via ORAL
  Filled 2013-10-07 (×3): qty 1

## 2013-10-07 MED ORDER — MORPHINE SULFATE 2 MG/ML IJ SOLN
2.0000 mg | INTRAMUSCULAR | Status: DC | PRN
  Administered 2013-10-07: 2 mg via INTRAVENOUS
  Filled 2013-10-07: qty 1

## 2013-10-07 MED ORDER — CARVEDILOL 12.5 MG PO TABS
12.5000 mg | ORAL_TABLET | Freq: Two times a day (BID) | ORAL | Status: DC
  Administered 2013-10-07 – 2013-10-09 (×5): 12.5 mg via ORAL
  Filled 2013-10-07 (×7): qty 1

## 2013-10-07 MED ORDER — SODIUM CHLORIDE 0.9 % IV SOLN: 1.0000 mL/kg/h | INTRAVENOUS | Status: AC

## 2013-10-07 MED ORDER — FUROSEMIDE 20 MG PO TABS
60.0000 mg | ORAL_TABLET | Freq: Two times a day (BID) | ORAL | Status: DC
  Administered 2013-10-07 – 2013-10-09 (×5): 60 mg via ORAL
  Filled 2013-10-07 (×7): qty 1

## 2013-10-07 MED ORDER — DOCUSATE SODIUM 100 MG PO CAPS
100.0000 mg | ORAL_CAPSULE | Freq: Two times a day (BID) | ORAL | Status: DC | PRN
  Filled 2013-10-07: qty 1

## 2013-10-07 MED ORDER — IPRATROPIUM-ALBUTEROL 0.5-2.5 (3) MG/3ML IN SOLN
3.0000 mL | Freq: Four times a day (QID) | RESPIRATORY_TRACT | Status: DC | PRN
  Administered 2013-10-07 – 2013-10-09 (×2): 3 mL via RESPIRATORY_TRACT
  Filled 2013-10-07 (×2): qty 3

## 2013-10-07 MED ORDER — SODIUM CHLORIDE 0.9 % IJ SOLN: 3.0000 mL | INTRAMUSCULAR | Status: DC | PRN

## 2013-10-07 MED ORDER — SENNA 8.6 MG PO TABS
1.0000 | ORAL_TABLET | Freq: Every day | ORAL | Status: DC | PRN
  Administered 2013-10-08: 8.6 mg via ORAL
  Filled 2013-10-07: qty 1

## 2013-10-07 MED ORDER — HEPARIN (PORCINE) IN NACL 2-0.9 UNIT/ML-% IJ SOLN
INTRAMUSCULAR | Status: AC
  Filled 2013-10-07: qty 500

## 2013-10-07 MED ORDER — CLOPIDOGREL BISULFATE 300 MG PO TABS
ORAL_TABLET | ORAL | Status: AC
  Filled 2013-10-07: qty 1

## 2013-10-07 MED ORDER — ASPIRIN 81 MG PO CHEW
324.0000 mg | CHEWABLE_TABLET | Freq: Once | ORAL | Status: AC
  Administered 2013-10-07: 324 mg via ORAL
  Filled 2013-10-07: qty 4

## 2013-10-07 MED ORDER — LISINOPRIL 10 MG PO TABS
10.0000 mg | ORAL_TABLET | Freq: Every day | ORAL | Status: DC
  Administered 2013-10-07 – 2013-10-09 (×3): 10 mg via ORAL
  Filled 2013-10-07 (×3): qty 1

## 2013-10-07 MED ORDER — POTASSIUM CHLORIDE ER 10 MEQ PO TBCR
10.0000 meq | EXTENDED_RELEASE_TABLET | Freq: Two times a day (BID) | ORAL | Status: DC
  Administered 2013-10-07 – 2013-10-09 (×6): 10 meq via ORAL
  Filled 2013-10-07 (×8): qty 1

## 2013-10-07 MED ORDER — NITROGLYCERIN IN D5W 200-5 MCG/ML-% IV SOLN
2.0000 ug/min | INTRAVENOUS | Status: DC
  Administered 2013-10-07: 10 ug/min via INTRAVENOUS
  Filled 2013-10-07: qty 250

## 2013-10-07 MED ORDER — PANTOPRAZOLE SODIUM 40 MG PO TBEC
40.0000 mg | DELAYED_RELEASE_TABLET | Freq: Every day | ORAL | Status: DC
  Administered 2013-10-07 – 2013-10-09 (×3): 40 mg via ORAL
  Filled 2013-10-07 (×3): qty 1

## 2013-10-07 MED ORDER — LIDOCAINE HCL (PF) 1 % IJ SOLN
INTRAMUSCULAR | Status: AC
  Filled 2013-10-07: qty 30

## 2013-10-07 MED ORDER — NITROGLYCERIN 0.4 MG SL SUBL: 0.4000 mg | SUBLINGUAL_TABLET | SUBLINGUAL | Status: DC | PRN

## 2013-10-07 MED ORDER — CLOPIDOGREL BISULFATE 75 MG PO TABS
75.0000 mg | ORAL_TABLET | Freq: Every day | ORAL | Status: DC
  Administered 2013-10-07 – 2013-10-08 (×2): 75 mg via ORAL
  Filled 2013-10-07 (×3): qty 1

## 2013-10-07 MED ORDER — ALBUTEROL SULFATE (2.5 MG/3ML) 0.083% IN NEBU
5.0000 mg | INHALATION_SOLUTION | RESPIRATORY_TRACT | Status: DC | PRN
  Administered 2013-10-07 – 2013-10-08 (×3): 5 mg via RESPIRATORY_TRACT
  Filled 2013-10-07 (×3): qty 6

## 2013-10-07 MED ORDER — ASPIRIN 81 MG PO CHEW
81.0000 mg | CHEWABLE_TABLET | Freq: Every day | ORAL | Status: DC
  Administered 2013-10-07 – 2013-10-09 (×3): 81 mg via ORAL
  Filled 2013-10-07 (×3): qty 1

## 2013-10-07 MED FILL — Medication: Qty: 1 | Status: AC

## 2013-10-07 NOTE — Progress Notes (Signed)
Pt was given 4000units IVP heparin per cardiologist order while in ED by Su Grand

## 2013-10-07 NOTE — ED Notes (Signed)
Pt to cath lab.

## 2013-10-07 NOTE — ED Notes (Signed)
Pt with CP that began 2 hours ago.  Pt took 4 sl nitro PTA without relief. Pt was found by ems 88% ion RA, placed on NRB with resolve of CP with this and spo2 100% on 15L NRB.  Pt is in no acute distress on arrival, some chest discomfort on arrival.  No aspirin (as pt couldn't chew)

## 2013-10-07 NOTE — H&P (Addendum)
BRIEF ADMISSION H&P NOTE  (See full Fellow's H&P for full details).  NAMETamanika Silva   MRN: [id number] DOB:  12-08-1960   ADMIT DATE: 10/06/2013  10/07/2013 12:32 AM  Tamara Silva is a 53 y.o. female she of CAD status post PCI at [facility] in 2006 [facility] 2009 (likely to the LAD), type 2 diabetes, hypertension hyperlipidemia, COPD and obesity with ischemic cardiomyopathy history. She was seen by Tamara Fuller, PA-C. in March of 2015 for chest discomfort. She had a Myoview stress test which not show any evidence of ischemia.   2-D echo 07/01/2013: EF 30-40% with mildly dilated LV and mild LVH. Mild HK of mid to distal anteroseptal myocardium. Pseudomonal filling the grade 3 diastolic dysfunction.  Scan Cardiolite 08/29/2013: Extensive LAD distribution scar with severe systolic dysfunction. No reversible ischemia. EF roughly 28%  She was brought in by EMS after calling for severe left-sided/substernal chest pressure with diaphoresis and dyspnea. On EMS arrival she had roughly 1.5-2 mm elevations in the inferior leads. Code STEMI was called. She was noted to be in significant dyspnea (described as sitting in a tripod position) that improved with sublingual nitroglycerin glycerin and aspirin as well as oxygen. In route to the hospital, she was noted to have intermittent bradycardia associated with chest pain, however by her arrival to the [facility] ER (roughly 12:04 AM) her symptoms had almost resolved.  Upon arrival to the emergency room, she was essentially chest pain-free, and the EMS EKG showed almost resolution of her ST segment elevations. There was significant difficulty obtaining an EKG while in the emergency room do to baseline wander. She'll system delays included the fact that she had inadequate IV access. Multiple attempts were made finally with ultrasound guidance to obtain a brachial vein IV.  This delayed her arrival to the Cath Lab until roughly 12:30 AM.  Past Medical  History  Diagnosis Date  . Diabetes mellitus without complication   . PE (pulmonary embolism)   . Hypercholesterolemia   . Hypertension   . COPD (chronic obstructive pulmonary disease)   . Asthma   . Coronary artery disease   . CHF (congestive heart failure)   . Mental disorder     BIPOLAR  . Shortness of breath   . GERD (gastroesophageal reflux disease)   . Neuromuscular disorder     DIABETIC NEUROPATHY  . Ischemic cardiomyopathy   . Bipolar disorder   . Post-traumatic stress syndrome    Past Surgical History  Procedure Laterality Date  . Coronary angioplasty with stent placement      CAD in 2006 x 2 and 2009 2 more- place din REx in [facility]  . Esophagogastroduodenoscopy (egd) with propofol N/A 08/03/2013    Procedure: ESOPHAGOGASTRODUODENOSCOPY (EGD) WITH PROPOFOL;  Surgeon: Jeryl Columbia, MD;  Location: [facility] END[facility]COPY;  Service: Endoscopy;  Laterality: N/A;  . Colonoscopy with propofol N/A 08/03/2013    Procedure: COLONOSCOPY WITH PROPOFOL;  Surgeon: Jeryl Columbia, MD;  Location: [facility] ENDOSCOPY;  Service: Endoscopy;  Laterality: N/A;   FAMHx: Family History  Problem Relation Age of Onset  . Stroke Mother   . Cancer Father    SOCHx:  reports that she quit smoking about 10 months ago. Her smoking use included Cigarettes. She smoked 0.00 packs per day. She has never used smokeless tobacco. She reports that she does not drink alcohol or use illicit drugs.  ALLERGIES: Allergies  Allergen Reactions  . Nsaids Other (See Comments)    "  I do not take NSAIDs d/t interfering with other meds"  . Metolazone Nausea And Vomiting    HOME MEDICATIONS: Prescriptions prior to admission  Medication Sig Dispense Refill  . albuterol (PROVENTIL) (2.5 MG/3ML) 0.083% nebulizer solution Take 6 mLs (5 mg total) by nebulization every 4 (four) hours as needed for wheezing or shortness of breath.  75 mL  12  . carvedilol (COREG) 25 MG tablet Take 1 tablet (25 mg total) by mouth 2 (two) times  daily with a meal.  60 tablet  1  . clopidogrel (PLAVIX) 75 MG tablet Take 75 mg by mouth daily with breakfast.      . diazepam (VALIUM) 10 MG tablet Take 0.5 tablets (5 mg total) by mouth every 8 (eight) hours as needed for anxiety.  30 tablet  0  . docusate sodium 100 MG CAPS Take 100 mg by mouth 2 (two) times daily as needed for mild constipation.  10 capsule  0  . fluticasone (FLONASE) 50 MCG/ACT nasal spray Place 2 sprays into the nose every morning.       . furosemide (LASIX) 40 MG tablet Take 40-80 mg by mouth See admin instructions. Take 2 tablets in the morning, 1 tablet at noon and 2 tablets in the evening.      . insulin glargine (LANTUS) 100 UNIT/ML injection Inject 0.35 mLs (35 Units total) into the skin at bedtime.  10 mL  11  . insulin lispro (HUMALOG) 100 UNIT/ML injection Inject 0-20 Units into the skin 3 (three) times daily before meals. SSI based on BG      . ipratropium-albuterol (DUONEB) 0.5-2.5 (3) MG/3ML SOLN Take 3 mLs by nebulization every 6 (six) hours as needed (wheezing).      [name] Kitchen lisinopril (PRINIVIL,ZESTRIL) 5 MG tablet Take 1 tablet (5 mg total) by mouth daily.  30 tablet  0  . nitroGLYCERIN (NITROSTAT) 0.4 MG SL tablet Place 1 tablet (0.4 mg total) under the tongue every 5 (five) minutes as needed for chest pain.  100 tablet  3  . omeprazole (PRILOSEC) 40 MG capsule Take 40 mg by mouth 2 (two) times daily.      . potassium chloride (K-DUR) 10 MEQ tablet Take 10 mEq by mouth 2 (two) times daily.       . pravastatin (PRAVACHOL) 40 MG tablet Take 40 mg by mouth every evening.       . senna (SENOKOT) 8.6 MG TABS tablet Take 1 tablet (8.6 mg total) by mouth daily as needed for mild constipation.  30 each  0  . traZODone (DESYREL) 100 MG tablet Take 100 mg by mouth at bedtime.      [name] Kitchen albuterol (PROVENTIL HFA;VENTOLIN HFA) 108 (90 BASE) MCG/ACT inhaler Inhale 2 puffs into the lungs every 6 (six) hours as needed for wheezing.       ROS: Review of Systems - Negative except  significant stress & anxiety over her children.  Drove her to pick up smoking again.  Otherwise intermittent exertional dyspnea & palpitations.   PHYSICAL EXAM:Blood pressure 102/82, pulse 66, resp. rate 18, height 5\' 7"  (1.702 m), weight 298 lb (135.172 kg), SpO2 100.00%. General appearance: alert, cooperative, appears stated age, moderate distress, morbidly obese and Intermittently noting chest pain. Bradycardia occurred with chest pain Neck: no adenopathy, no carotid bruit, no JVD and supple, symmetrical, trachea midline Lungs: clear to auscultation bilaterally, normal percussion bilaterally and Nonlabored upon exam Heart: Distant heart sounds but no obvious M./R./G. normal S1-S2; unable to palpate PMI Abdomen:  soft, non-tender; bowel sounds normal; no masses,  no organomegaly and morbidly obese Extremities: extremities normal, atraumatic, no cyanosis or edema Pulses: Palpable but weak and thready D2 borderline pressures. Neurologic: Grossly normal. CN 2 to 12 grossly intac  ECG: NSR, 67 beats a minute. Minimal inferior ST elevations of roughly 0.5 mm. With mild inferior Q waves   EKG by EMS demonstrated more significant roughly 1-1.5 mm ST elevations in leads 2,3 and aVF.  IMPRESSION & PLAN  Tamara Silva has presented today with inferior ST elevation MI and intermittent symptomatic bradycardia. The various methods of treatment have been discussed with the patient and family.   Risks / Complications include, but not limited to: Death, MI, CVA/TIA, VF/VT (with defibrillation), Bradycardia (need for temporary pacer placement), contrast induced nephropathy, bleeding / bruising / hematoma / pseudoaneurysm, vascular or coronary injury (with possible emergent CT or Vascular Surgery), adverse medication reactions, infection.    After consideration of risks, benefits and other options for treatment, the patient has consented to Procedure(s):  LEFT HEART CATHETERIZATION AND CORONARY ANGIOGRAPHY  +/- AD [place]  as a surgical intervention.   We will proceed with the planned procedure. Additional plan per fellow note cardiac cath report Continue home medications for diabetes and hypertension once her blood pressure responds - but will use lower dose beta blocker until her heart rate is stable   [facility] [address] [place], [place]  [address]  [phone]  10/07/2013 1:32 AM

## 2013-10-07 NOTE — Progress Notes (Signed)
SUBJECTIVE: The patient is doing well today.  At this time, she denies chest pain, shortness of breath, or any new concerns.  [name] Kitchen aspirin  81 mg Oral Daily  . atorvastatin  40 mg Oral q1800  . carvedilol  12.5 mg Oral BID WC  . clopidogrel  75 mg Oral Q breakfast  . fluticasone  2 spray Each Nare q morning - 10a  . furosemide  60 mg Oral BID  . heparin      . insulin aspart  0-20 Units Subcutaneous TID WC  . insulin glargine  35 Units Subcutaneous QHS  . lisinopril  5 mg Oral Daily  . pantoprazole  40 mg Oral Daily  . potassium chloride  10 mEq Oral BID  . sodium chloride  3 mL Intravenous Q12H  . traZODone  100 mg Oral QHS   . nitroGLYCERIN 10 mcg/min (10/07/13 0502)    OBJECTIVE: Physical Exam: Filed Vitals:   10/07/13 0640 10/07/13 0645 10/07/13 0700 10/07/13 0715  BP: 130/77 124/78 117/50   Pulse: 69 67 70 70  Temp:      TempSrc:      Resp: 21 19 18 21   Height:      Weight:      SpO2: 100% 100% 99% 100%    Intake/Output Summary (Last 24 hours) at 10/07/13 0744 Last data filed at 10/07/13 0700  Gross per 24 hour  Intake  786.2 ml  Output    950 ml  Net -163.8 ml    Telemetry reveals sinus rhythm with occasional pvcs  GEN- The patient is well appearing, alert and oriented x 3 today.   Head- normocephalic, atraumatic Eyes-  Sclera clear, conjunctiva pink Ears- hearing intact Neck-  no JVP Lungs- Clear to ausculation bilaterally, normal work of breathing Heart- Regular rate and rhythm with ectopy, no murmurs, rubs or gallops, PMI not laterally displaced GI- soft, NT, ND, + BS Extremities- no clubbing, cyanosis, or edema. Right femoral entry site without complications.  Femoral CVL is still in place Skin- no rash or lesion Neuro- strength and sensation are intact  LABS: Basic Metabolic Panel:  Recent Labs  10/07/13 0015 10/07/13 0305  NA 144 138  K 4.2 3.8  CL 105 100  CO2 29 26  GLUCOSE 151* 159*  BUN 14 15  CREATININE 1.26* 1.06  CALCIUM 8.2*  8.2*   CBC:  Recent Labs  10/07/13 0015 10/07/13 0305  WBC 8.9 10.1  NEUTROABS 4.4  --   HGB 10.0* 10.6*  HCT 32.0* 34.5*  MCV 79.6 79.5  PLT 247 292   Cardiac Enzymes:  Recent Labs  10/07/13 0214  CKTOTAL 140  CKMB 2.9  TROPONINI <0.30    RADIOLOGY: No results found.  ASSESSMENT AND PLAN:  Principal Problem:   ST elevation myocardial infarction (STEMI) of inferior wall, initial episode of care Active Problems:   Tobacco abuse   Essential hypertension   Hyperlipidemia   Cardiomyopathy, ischemic   COPD (chronic obstructive pulmonary disease)   Chronic combined systolic and diastolic congestive heart failure, NYHA class 2; EF 30-35%   Morbid obesity   Type 2 diabetes mellitus treated with insulin   CAD S/P percutaneous coronary angioplasty - prior PCI to LAD; RCA PCI: new Xience Alpine DES 2.75 mm x 15 mm (with similar   Normocytic anemia  #Acute Inferior MI: LHC with severe single-vessel disease with distal RCA 99% thrombotic subtotal occlusion. S/p DES placement in RCA.  Plan  - cont with Plavix and ASA -  cont with coreg  - cont with Lipitor. Will obtain a lipid panel  - post cath 2 d echo pending  - encouraged smoking cessation - will be transferred to tele or even home today after echo   #combined chronic CHF. Last echo 08/4013 with EF 30% and grade 2 DD. No evidence of fluid overload on exam.  Plan - cont with current Lasix dose  60 mg bid.  - optimize medical regimen - outpatient follow up with cardiology and hear failure team    # Diabetes type 2: Uncontrolled. Will require aggressive outpatient diabetes controll in the setting of active CAD. Cont with insulin therapy here  #COPD: currently stable.   Case discussed with Dr Rayann Heman.    Signed:  Jessee Avers, MD PGY-2 Internal Medicine Teaching Service Pager: [phone] 10/07/2013, 8:01 AM   I have seen, examined the patient, and reviewed the above assessment and plan.  Changes to above are  made where necessary.  Pt recovering s/p inferior MI with PCI.  Will remove CVL and transfer to stepdown today. Increase lisinopril to 10mg  daily.  Continue to optimize CHF regimen while here.   Co Sign: Thompson Grayer, MD 10/07/2013 9:32 AM

## 2013-10-07 NOTE — CV Procedure (Addendum)
CARDIAC CATHETERIZATION AND PERCUTANEOUS CORONARY INTERVENTION REPORT  NAME:  Tamara Silva   MRN: [id number] DOB:  [birth year]   ADMIT DATE: 10/06/2013 Procedure Date: 10/07/2013  INTERVENTIONAL CARDIOLOGIST: Leonie Man, M.D., MS PRIMARY CARE PROVIDER: Philis Fendt, MD PRIMARY CARDIOLOGIST: Lorretta Harp, M.D.  PATIENT:  Tamara Silva is a 53 y.o. female with a known history of CAD status post PCI 2006 and 2009 as well as obesity, and type 2 diabetes, hypertension, hyperlipidemia who Ellyn Hack carries a diagnosis of ischemic cardiomyopathy EF of 30-35 %. She is on Plavix, but not aspirin. She was in her usual state of health until just about 11 PM when she developed the onset substernal chest pressure diaphoresis with severe dyspnea. She took baby aspirin and sublingual nitroglycerin x2. Upon EMS arrival she was found to be hypotensive secondary nitroglycerin, and hypoxic he in apparent respiratory distress. Her EKG demonstrated ST elevations, and to STEMI was called.  She was improved dramatically with oxygen by nasal cannula. EKG ST elevations that proceeded to resolve in transport to [facility] ER. Upon arrival to the ER she was essentially chest pain-free. Each episode of chest discomfort/angina was associated with worsening bradycardia and hypotension.  Initial EKGs in the restroom are very difficult to obtain. Finally a stable EKG demonstrated mild residual ST elevations. There was extreme difficulty with getting IV access in the emergency room. Finally upon obtaining IV access the patient was brought to the catheterization lab.  System delays included difficulty with IV access in the emergency room leading to delayed arrival to the Cath Lab. Difficult arterial access. Initial attempts at radial access was unsuccessful due to onset of bradycardia and hypotension with recurrent anginal pain after draping. Switching to femoral arterial access and obtaining femoral venous access for  guaranteed IV access and potentially for Temporary Transvenous Pacemaker placement.  PRE-OPERATIVE DIAGNOSIS:    Inferior STEMI complicated by bradycardia  Known history of CAD  PROCEDURES PERFORMED:    Left Heart Catheterization with Coronary Angiography  Percutaneous Coronary Intervention of the Distal RCA with a single Xience Alpine Drug-Eluting Stent (DES) 2.75 mm x 15 mm  - postdilated to 3.0 mm  Right Common Femoral Vein Central Venous Line Placement  PROCEDURE:Consent:  Risks of procedure as well as the alternatives and risks of each were explained to the (patient/caregiver). Verbal Consent for procedure obtained.  The patient was unable to sign the form because she was in the process of having it IV line placed in the right arm.   PROCEDURE: The patient was brought to the [facility]nd [facility] Lab and prepped and draped in the usual sterile fashion for Left groin or radial access. A modified Allen's test with plethysmography was performed, revealing excellent Ulnar artery collateral flow.  Sterile technique was used including antiseptics, cap, gloves, gown, hand hygiene, mask and sheet.  Skin prep: Chlorhexidine.  Her recently placed right brachial vein IV was almost dislodged in transfer. This caused yet again another system delay in door to balloon time.  Time Out: Verified patient identification, verified procedure, site/side was marked, verified correct patient position, special equipment/implants available, medications/allergies/relevent history reviewed, required imaging and test results available.  Performed  Access: After initial attempts are made to access the right radial artery, her angina had returned and her blood pressure and heart rate dropped making the radial pulse nonpalpable. Therefore the decision was made to abort radial access and move toward femoral access.  Right Common Femoral Artery: 6 Fr Sheath --  fluoroscopically guided modified  Seldinger technique  Right Common Femoral Vein: 6 Fr sheath -- fluoroscopically guided modified Seldinger technique; IV fluids were hooked up to this line and it was used for all further medications.  Diagnostic Left Heart Catheterization with Coronary Angiography:  5 Fr JL4, JR 4, Angled Pigtail catheters advanced and exchanged over standard J-wire into the ascending aorta and used for selective coronary artery engagement.  Left Coronary Artery Angiography: JL4  Right Coronary Artery Angiography: JR 4  LV Hemodynamics (LV Gram): JR 4 -pre-PCI hemodynamics; Angled pigtail; post-PCI  Sheaths:  Sutured in place to be removed in the CCU with manual pressure.   Hemodynamics:  Central Aortic / Mean Pressures: Initial pressures 105/64/81 mmHg; final post PCI pressures 139/81/104 mmHg  Left Ventricular Pressures / EDP: Initial 97/29/34 mmHg; final 138/18/30 mmHg  Left Ventriculography:  EF: Roughly 30 %; dilated  Wall Motion: Global hypokinesis. Poor opacification makes interpretation difficult.  Coronary Anatomy: Right Coronary Dominant  Left Main: Silva-caliber vessel that bifurcates into the LAD and Circumflex. Angiographically normal. LAD: Moderate caliber vessel with a relatively proximal stent just after a proximal diagonal branch that has 20-30% in-stent restenosis the remainder the vessel continues as a moderate caliber vessel that reaches down to the apex. Angiographically normal after the stent.  D1: Small caliber very proximal vessel. Mild luminal irregularities.  D2: Small to moderate caliber vessel  just prior to the stent in the LAD. There is roughly 20-30% ostial and proximal stenoses. The vessel bifurcates distally.  Left Circumflex: Begins as a moderate-caliber vessel that gives off a very proximal high obtuse marginal/ramus intermedius. This has several small branches. The follow on circumflex is diminutive and terminates as a small second obtuse marginal with a very  minimal AV groove circumflex. Diffuse mild luminal irregularities noted throughout the system.   RCA: Silva-caliber, dominant vessel with a downward takeoff. There is several branches. There is hazy 99% thrombotic stenosis in the distal vessel just prior to bifurcation into the Right Posterior Descending Artery (RPDA) and the Right Posterior AV Groove Branch (RPAV). There is TIMI 1 flow beyond the stenosis.  RPDA: Moderate caliber vessel with TIMI 1 flow distally;   post initial angioplasty revealed a significant posterior descending artery reaching almost to the apex the several septal perforators noted. There is actually a tandem vessel that comes off very proximally. There are extensive collaterals that appear to be going from the posterior descending artery to the LAD through the septum.  RPL Sysytem:The RPAV begins as a moderate caliber vessel with a small proximal branch followed by a 650-60% stenosis. It then normalizes and gives off 3 posterior lateral branches.  After reviewing the initial angiography, the culprit lesion was thought to be the 99% thrombotic stenosis in the distal RCA.  Preparation were made to proceed with PCI on this lesion.  Percutaneous Coronary Intervention:    Lesion: 99% distal RCA thrombotic occlusion --reduced to 0%  TIMI 1 flow pre-, TIMI-3 flow post PCI  Guide: 6 Fr   JR 4 Guidewire: Pro-water Predilation Balloon: Euphora 2.0  mm x 12  mm;   10  Atm x 30  Sec, 10  Atm x 20  Sec Stent: Xience Alpine DES 2.75  mm x 15  mm;   12  Atm x 30  Sec, 16  Atm x 45  Sec; 3.0 mm  Post-dilation Balloon: [place] Euphora 3.0  mm x all  mm;   14  Atm x 45  Sec - distal,  16  Atm x 45  Sec  Final Diameter: 3.1 mm  Post deployment angiography in multiple views, with and without guidewire in place revealed excellent stent deployment and lesion coverage.  There was no evidence of dissection or perforation.  MEDICATIONS:  Anesthesia:  Local Lidocaine 18 mL   Sedation:  1   mg IV Versed,   Premedication: 5000 units of heparin in the Emergency Room   Omnipaque Contrast: 140  ml  Anticoagulation:  Angiomax Bolus & drip; continued for 2 hours post PCI   Anti-Platelet Agent:  Plavix 300 mg  PATIENT DISPOSITION:    The patient was transferred to the PACU holding area in a hemodynamicaly stable, chest pain free condition.  The patient tolerated the procedure well, and there were no complications.  EBL:   < 10  ml  The patient was stable before, during, and after the procedure.  POST-OPERATIVE DIAGNOSIS:    Severe single-vessel disease with distal RCA 99% thrombotic subtotal occlusion.  Successful PCI of the distal RCA with a Xience Alpine 2.75 mm x 4m postdilated to 3.1 mm  Mild in-stent restenosis in the LAD stent.  Dilated Left Ventricle with global hypokinesis and EF of roughly 30%; poor opacification.  Resolve symptomatic bradycardia as a complication of the inferior STEMI  Severely elevated LVEDP  PLAN OF CARE:  Admit to CCU for standard post PCI care. Continue Angiomax for 2 hours.  Would keep the venous line in place until lab draws are complete and guaranteed peripheral venous access is established.  Will dose IV Lasix x1 tonight and restart home Lasix at 60 mg by mouth twice a day in the morning.  Restart home medications but with reduced rate of carvedilol until heart rate is stabilized.  Check 2-D echocardiogram to assess EF given poor opacification with LV gram  Provided the patient does not have any additional heart failure or anginal symptoms, she could potentially be considered fast-track candidate despite her low EF.  Smoking cessation counseling required  Aggressive modification risk factors including diabetes, hypertension and lipid control as well as weight loss.   DLeonie Man M.D., M.S. [facility] [address] [place]  [id number] [phone] 10/07/2013 1:52  AM

## 2013-10-07 NOTE — Progress Notes (Signed)
  Echocardiogram 2D Echocardiogram has been performed.  Tamara Silva 10/07/2013, 5:03 PM

## 2013-10-07 NOTE — Progress Notes (Signed)
Into patient room for rounds found patient eating a [employer] sausage and egg biscuit.  I explained that this was not heart healthy and she should not eat these types of foods with her heart disease and current state of health.  I then went to the [employer] website and discussed the content of the food (fat, sodium, ect.). She was receptive to this teaching yet continued to consume the sausage biscuit as I talked to her.  Tamara Silva's level of understanding and motivation to change her diet and lifestyle is unclear to me at this time.  Will continue to teach and positively motivate.

## 2013-10-07 NOTE — ED Provider Notes (Signed)
CSN: [id number]     Arrival date & time 10/06/13  2358 History   First MD Initiated Contact with Patient 10/07/13 0008     Chief Complaint  Patient presents with  . Code STEMI     (Consider location/radiation/quality/duration/timing/severity/associated sxs/prior Treatment) HPI Comments: 53 y/o comes in with cc of chest pain. Pt has hx of CAD, with stents placed in 06 and 09 and PE. Pt states that she started having severe chest pain, midsternal, pressure like, with diaphoresis. Pt called EMS. At their arrival, she was noted to be hypotensive with hypoxia. EKG showed same St elevation in the inferior leads, and code STEMI was called. Pt's O2 improved with NRB. Her sx improved as well. She is currently having mild chest pain.   The history is provided by the patient and medical records.    Past Medical History  Diagnosis Date  . Diabetes mellitus without complication   . PE (pulmonary embolism)   . Hypercholesterolemia   . Hypertension   . COPD (chronic obstructive pulmonary disease)   . Asthma   . Coronary artery disease   . CHF (congestive heart failure)   . Mental disorder     BIPOLAR  . Shortness of breath   . GERD (gastroesophageal reflux disease)   . Neuromuscular disorder     DIABETIC NEUROPATHY  . Ischemic cardiomyopathy   . Bipolar disorder   . Post-traumatic stress syndrome    Past Surgical History  Procedure Laterality Date  . Coronary angioplasty with stent placement      CAD in 2006 x 2 and 2009 2 more- place din REx in [place] and [place] [facility]  . Esophagogastroduodenoscopy (egd) with propofol N/A 08/03/2013    Procedure: ESOPHAGOGASTRODUODENOSCOPY (EGD) WITH PROPOFOL;  Surgeon: Jeryl Columbia, MD;  Location: [facility] ENDOSCOPY;  Service: Endoscopy;  Laterality: N/A;  . Colonoscopy with propofol N/A 08/03/2013    Procedure: COLONOSCOPY WITH PROPOFOL;  Surgeon: Jeryl Columbia, MD;  Location: [facility] ENDOSCOPY;  Service: Endoscopy;  Laterality: N/A;   Family History  Problem Relation  Age of Onset  . Stroke Mother   . Cancer Father    History  Substance Use Topics  . Smoking status: Former Smoker    Types: Cigarettes    Quit date: 11/24/2012  . Smokeless tobacco: Never Used  . Alcohol Use: No   OB History   Grav Para Term Preterm Abortions TAB SAB Ect Mult Living                 Review of Systems  Unable to perform ROS Constitutional: Positive for diaphoresis. Negative for activity change.  Respiratory: Negative for shortness of breath.   Cardiovascular: Positive for chest pain.  Gastrointestinal: Negative for nausea, vomiting and abdominal pain.  Genitourinary: Negative for dysuria.  Musculoskeletal: Negative for neck pain.  Neurological: Negative for headaches.  All other systems reviewed and are negative.     Allergies  Nsaids and Metolazone  Home Medications   Prior to Admission medications   Medication Sig Start Date End Date Taking? Authorizing Provider  albuterol (PROVENTIL HFA;VENTOLIN HFA) 108 (90 BASE) MCG/ACT inhaler Inhale 2 puffs into the lungs every 6 (six) hours as needed for wheezing. 06/29/13   Kathalene Frames, MD  albuterol (PROVENTIL) (2.5 MG/3ML) 0.083% nebulizer solution Take 6 mLs (5 mg total) by nebulization every 4 (four) hours as needed for wheezing or shortness of breath. 06/24/13   Stephani Police Piepenbrink, PA-C  carvedilol (COREG) 25 MG tablet Take 1 tablet (  25 mg total) by mouth 2 (two) times daily with a meal. 06/29/13   Kathalene Frames, MD  clopidogrel (PLAVIX) 75 MG tablet Take 75 mg by mouth daily with breakfast.    Historical Provider, MD  diazepam (VALIUM) 10 MG tablet Take 0.5 tablets (5 mg total) by mouth every 8 (eight) hours as needed for anxiety. 07/16/13   Allie Bossier, MD  docusate sodium 100 MG CAPS Take 100 mg by mouth 2 (two) times daily as needed for mild constipation. 08/04/13   Hosie Poisson, MD  FLUoxetine (PROZAC) 20 MG capsule Take 1 capsule (20 mg total) by mouth daily. 07/16/13   Allie Bossier, MD  fluticasone  (FLONASE) 50 MCG/ACT nasal spray Place 2 sprays into the nose every morning.     Historical Provider, MD  furosemide (LASIX) 40 MG tablet Take 40-80 mg by mouth 3 (three) times daily. Take 2 tabs in the morning. 1 tab at noon. 2 tabs in the evening.    Historical Provider, MD  insulin glargine (LANTUS) 100 UNIT/ML injection Inject 0.35 mLs (35 Units total) into the skin at bedtime. 08/04/13   Hosie Poisson, MD  insulin lispro (HUMALOG) 100 UNIT/ML injection Inject 0-20 Units into the skin 3 (three) times daily before meals. SSI based on BG    Historical Provider, MD  ipratropium-albuterol (DUONEB) 0.5-2.5 (3) MG/3ML SOLN Take 3 mLs by nebulization every 6 (six) hours as needed (wheezing). 06/29/13   Kathalene Frames, MD  lisinopril (PRINIVIL,ZESTRIL) 5 MG tablet Take 1 tablet (5 mg total) by mouth daily. 08/04/13   Hosie Poisson, MD  metoCLOPramide (REGLAN) 10 MG tablet Take 1 tablet (10 mg total) by mouth 4 (four) times daily -  before meals and at bedtime. 07/16/13   Allie Bossier, MD  nitroGLYCERIN (NITROSTAT) 0.4 MG SL tablet Place 1 tablet (0.4 mg total) under the tongue every 5 (five) minutes as needed for chest pain. 02/08/13   Lorretta Harp, MD  omeprazole (PRILOSEC) 40 MG capsule Take 40 mg by mouth 2 (two) times daily.    Historical Provider, MD  ondansetron (ZOFRAN) 4 MG tablet Take 1 tablet (4 mg total) by mouth 4 (four) times daily as needed for nausea or vomiting. 07/16/13   Allie Bossier, MD  potassium chloride (K-DUR) 10 MEQ tablet Take 10 mEq by mouth 2 (two) times daily.  10/21/12 10/21/13  Historical Provider, MD  pravastatin (PRAVACHOL) 40 MG tablet Take 40 mg by mouth every evening.     Historical Provider, MD  senna (SENOKOT) 8.6 MG TABS tablet Take 1 tablet (8.6 mg total) by mouth daily as needed for mild constipation. 08/04/13   Hosie Poisson, MD  tiZANidine (ZANAFLEX) 4 MG tablet Take 0.5 tablets (2 mg total) by mouth every 6 (six) hours as needed for muscle spasms. 07/16/13   Allie Bossier,  MD  traZODone (DESYREL) 100 MG tablet Take 100 mg by mouth at bedtime.    Historical Provider, MD   SpO2 100% Physical Exam  Nursing note and vitals reviewed. Constitutional: She is oriented to person, place, and time. She appears well-developed and well-nourished.  HENT:  Head: Normocephalic and atraumatic.  Eyes: EOM are normal. Pupils are equal, round, and reactive to light.  Neck: Neck supple.  Cardiovascular: Normal rate, regular rhythm and normal heart sounds.   No murmur heard. Pulmonary/Chest: Effort normal. No respiratory distress.  Abdominal: Soft. She exhibits no distension. There is no tenderness. There is no rebound and no guarding.  Neurological: She is alert and oriented to person, place, and time.  Skin: Skin is warm and dry.    ED Course  Procedures (including critical care time) Labs Review Labs Reviewed  CBC  DIFFERENTIAL  PROTIME-INR  APTT  BASIC METABOLIC PANEL  I-STAT [place], ED    Imaging Review No results found.   EKG Interpretation   Date/Time:  Sunday Oct 07 2013 00:05:54 EDT Ventricular Rate:  73 PR Interval:  153 QRS Duration: 79 QT Interval:  696 QTC Calculation: 767 R Axis:   45 Text Interpretation:  Normal sinus rhythm Premature ventricular complexes  Artifact St elevation avF Confirmed by Kathrynn Humble, MD, Thelma Comp [id number]) on  10/07/2013 12:15:03 AM      MDM   Final diagnoses:  None    Pt comes in with chest pain. Hx of CAD, and the ekg and the hx are concerning.  EKG en route especially shows ST elevation in the inf leads with reciprocal depression. CATH lab activated by EMS. Dr. Ellyn Hack at bedside, and patients EKG in the ER shows no STEMI - but decision made to take patient to cath lab given her intermittent chest pains.  Angiocath insertion Performed by: Riti Rollyson  Consent: Verbal consent obtained. Risks and benefits: risks, benefits and alternatives were discussed Time out: Immediately prior to procedure a "time  out" was called to verify the correct patient, procedure, equipment, support staff and site/side marked as required.  Preparation: Patient was prepped and draped in the usual sterile fashion.  Vein Location: right antecubital fossa  Ultrasound Guided  Gauge: 20  Normal blood return and flush without difficulty Patient tolerance: Patient tolerated the procedure well with no immediate complications.   CRITICAL CARE Performed by: Hawk Mones   Total critical care time: 35 min  Critical care time was exclusive of separately billable procedures and treating other patients.  Critical care was necessary to treat or prevent imminent or life-threatening deterioration.  Critical care was time spent personally by me on the following activities: development of treatment plan with patient and/or surrogate as well as nursing, discussions with consultants, evaluation of patient's response to treatment, examination of patient, obtaining history from patient or surrogate, ordering and performing treatments and interventions, ordering and review of laboratory studies, ordering and review of radiographic studies, pulse oximetry and re-evaluation of patient's condition.       Varney Biles, MD 10/07/13 [phone]

## 2013-10-07 NOTE — Progress Notes (Signed)
r femoral sheath pulled witout any complication [id number] pre-post procedure,manual pressure for 15 minutes w/ level 0-1.R venous sheath left/DrHarding for blood draw.

## 2013-10-08 ENCOUNTER — Inpatient Hospital Stay: Payer: Self-pay | Admitting: Adult Health

## 2013-10-08 ENCOUNTER — Inpatient Hospital Stay (HOSPITAL_COMMUNITY): Payer: Medicaid Other

## 2013-10-08 DIAGNOSIS — Z9861 Coronary angioplasty status: Secondary | ICD-10-CM

## 2013-10-08 LAB — HEMOGLOBIN A1C
Hgb A1c MFr Bld: 7.9 % — ABNORMAL HIGH (ref <5.7)
Mean Plasma Glucose: 180 mg/dL — ABNORMAL HIGH (ref <117)

## 2013-10-08 LAB — POCT ACTIVATED CLOTTING TIME: Activated Clotting Time: 647 seconds

## 2013-10-08 LAB — GLUCOSE, CAPILLARY
GLUCOSE-CAPILLARY: 134 mg/dL — AB (ref 70–99)
Glucose-Capillary: 245 mg/dL — ABNORMAL HIGH (ref 70–99)
Glucose-Capillary: 56 mg/dL — ABNORMAL LOW (ref 70–99)
Glucose-Capillary: 71 mg/dL (ref 70–99)
Glucose-Capillary: 139 mg/dL — ABNORMAL HIGH (ref 70–99)

## 2013-10-08 LAB — POCT I-STAT, CHEM 8
BUN: 13 mg/dL (ref 6–23)
CREATININE: 1.3 mg/dL — AB (ref 0.50–1.10)
Calcium, Ion: 1.12 mmol/L (ref 1.12–1.23)
Chloride: 100 mEq/L (ref 96–112)
GLUCOSE: 111 mg/dL — AB (ref 70–99)
HCT: 35 % — ABNORMAL LOW (ref 36.0–46.0)
Hemoglobin: 11.9 g/dL — ABNORMAL LOW (ref 12.0–15.0)
POTASSIUM: 3.9 meq/L (ref 3.7–5.3)
Sodium: 143 mEq/L (ref 137–147)
TCO2: 27 mmol/L (ref 0–100)

## 2013-10-08 LAB — HEPATIC FUNCTION PANEL
ALK PHOS: 82 U/L (ref 39–117)
ALT: 12 U/L (ref 0–35)
AST: 16 U/L (ref 0–37)
Albumin: 2.9 g/dL — ABNORMAL LOW (ref 3.5–5.2)
BILIRUBIN TOTAL: 0.2 mg/dL — AB (ref 0.3–1.2)
Bilirubin, Direct: <0.2 mg/dL (ref 0.0–0.3)
Total Protein: 6.5 g/dL (ref 6.0–8.3)

## 2013-10-08 MED ORDER — POLYETHYLENE GLYCOL 3350 17 G PO PACK
17.0000 g | PACK | Freq: Every day | ORAL | Status: DC
  Administered 2013-10-08: 17 g via ORAL
  Filled 2013-10-08 (×3): qty 1

## 2013-10-08 MED ORDER — PRASUGREL HCL 10 MG PO TABS
10.0000 mg | ORAL_TABLET | Freq: Every day | ORAL | Status: DC
  Administered 2013-10-09: 10 mg via ORAL
  Filled 2013-10-08: qty 1

## 2013-10-08 MED ORDER — INSULIN ASPART 100 UNIT/ML ~~LOC~~ SOLN
0.0000 [IU] | Freq: Three times a day (TID) | SUBCUTANEOUS | Status: DC
  Administered 2013-10-08: 3 [IU] via SUBCUTANEOUS
  Administered 2013-10-09: 11 [IU] via SUBCUTANEOUS
  Administered 2013-10-09: 3 [IU] via SUBCUTANEOUS

## 2013-10-08 MED FILL — Sodium Chloride IV Soln 0.9%: INTRAVENOUS | Qty: 50 | Status: AC

## 2013-10-08 NOTE — Progress Notes (Signed)
Pt. Seen and examined. Agree with the NP/PA-C note as written.  Inferior STEMI s/p DES. P2Y12 assay suggests she may be a plavix non-responder - switch to Effient. Consult case management. Continue lipitor post-dc. Will need better glycemic control. Will need outpatient psych referral due to bipolar disorder.  Probably d/c tomorrow am.  Pixie Casino, MD, [facility] Attending Cardiologist [place]

## 2013-10-08 NOTE — Progress Notes (Signed)
Patient Name: Tamara Silva Date of Encounter: 10/08/2013  Principal Problem:   ST elevation myocardial infarction (STEMI) of inferior wall, initial episode of care Active Problems:   Tobacco abuse   Essential hypertension   Hyperlipidemia   Cardiomyopathy, ischemic   COPD (chronic obstructive pulmonary disease)   Chronic combined systolic and diastolic congestive heart failure, NYHA class 2; EF 30-35%   Morbid obesity   Type 2 diabetes mellitus treated with insulin   CAD S/P percutaneous coronary angioplasty - prior PCI to LAD; RCA PCI: new Xience Alpine DES 2.75 mm x 15 mm    Normocytic anemia    Patient Profile: CAD status post PCI at [facility] in 2006 [facility] 2009 (likely to the LAD), type 2 diabetes, hypertension hyperlipidemia, COPD and obesity with ischemic cardiomyopathy was admitted 05/10 w/ inferior STEMI, distal RCA 99% thrombotic and rx w/ DES, EF 30%   SUBJECTIVE: No chest pain, no SOB. Angry. 1. Does not want to be characterized as a smoker, says she has only been smoking occasionally in the last 3 months, when family issues are stressing her out. 2. Feels she got poor care (mentions Dr. Gwenlyn Found) and says she should have been cathed, not a stress test when first seen. Then she would not have had a heart attack. 3. Says her BP was fine before she came in, the heart attack stressed her out and that's why her BP is high now. Says the medications she was on before are fine. They will still cost her $3.   OBJECTIVE Filed Vitals:   10/07/13 2049 10/08/13 0417 10/08/13 0846 10/08/13 1202  BP: 93/46 137/76 102/50 133/76  Pulse: 75 74 80   Temp: 97.7 F (36.5 C) 97.8 F (36.6 C)    TempSrc: Oral Oral    Resp: 21 20    Height:      Weight:      SpO2: 98% 100%      Intake/Output Summary (Last 24 hours) at 10/08/13 1237 Last data filed at 10/07/13 1800  Gross per 24 hour  Intake    960 ml  Output    600 ml  Net    360 ml   Filed Weights   10/07/13 0017  10/07/13 0200 10/07/13 0400  Weight: 298 lb (135.172 kg) 300 lb 4.3 oz (136.2 kg) 300 lb 4.3 oz (136.2 kg)    PHYSICAL EXAM General: Well developed, well nourished, female in no acute distress. Head: Normocephalic, atraumatic.  Neck: Supple without bruits, JVD not elevated. Lungs:  Resp regular and unlabored, few dry rales. Heart: RRR, S1, S2, no S3, S4, or murmur; no rub. Abdomen: Soft, non-tender, non-distended, BS + x 4.  Extremities: No clubbing, cyanosis, no edema.  Neuro: Alert and oriented X 3. Moves all extremities spontaneously. Psych: Normal affect.  LABS: CBC:  Recent Labs  10/07/13 0015 10/07/13 0305  WBC 8.9 10.1  NEUTROABS 4.4  --   HGB 10.0* 10.6*  HCT 32.0* 34.5*  MCV 79.6 79.5  PLT 247 292   INR:  Recent Labs  10/07/13 0015  INR [id number]   Basic Metabolic Panel:  Recent Labs  10/07/13 0015 10/07/13 0305  NA 144 138  K 4.2 3.8  CL 105 100  CO2 29 26  GLUCOSE 151* 159*  BUN 14 15  CREATININE 1.26* 1.06  CALCIUM 8.2* 8.2*   Liver Function Tests:  Recent Labs  10/08/13 0440  AST 16  ALT 12  ALKPHOS 82  BILITOT 0.2*  PROT 6.5  ALBUMIN 2.9*   Cardiac Enzymes: Recent Labs  10/07/13 0214 10/07/13 1620  CKTOTAL 140  --   CKMB 2.9  --   TROPONINI <0.30 1.00*  P2Y12 297    BNP: Pro B Natriuretic peptide (BNP)  Date/Time Value Ref Range Status  08/02/2013  4:10 AM 406.0* 0 - 125 pg/mL Final  07/31/2013  4:43 AM 1233.0* 0 - 125 pg/mL Final   Hemoglobin A1C:No results found for this basename: HGBA1C,  in the last 72 hours Fasting Lipid Panel:  Recent Labs  10/07/13 0910  CHOL 139  HDL 54  LDLCALC 67  TRIG 92  CHOLHDL 2.6   TELE: SR, PVCs and pairs  Radiology/Studies: Dg Chest 2 View  10/08/2013   CLINICAL DATA:  Myocardial infarction  EXAM: CHEST  2 VIEW  COMPARISON:  07/31/2013  FINDINGS: Stable scarring in the left mid lung and left base. Stable prominent interstitial markings. There are no areas consolidation. No pulmonary  edema. No pleural effusion or pneumothorax.  Heart, mediastinum and hila are unremarkable.  The bony thorax is intact.  IMPRESSION: No acute cardiopulmonary disease.   Electronically Signed   By: Lajean Manes M.D.   On: 10/08/2013 08:07     Current Medications:  . aspirin  81 mg Oral Daily  . atorvastatin  40 mg Oral q1800  . carvedilol  12.5 mg Oral BID WC  . clopidogrel  75 mg Oral Q breakfast  . fluticasone  2 spray Each Nare q morning - 10a  . furosemide  60 mg Oral BID  . insulin aspart  0-20 Units Subcutaneous TID WC  . insulin glargine  35 Units Subcutaneous QHS  . lisinopril  10 mg Oral Daily  . pantoprazole  40 mg Oral Daily  . potassium chloride  10 mEq Oral BID  . sodium chloride  3 mL Intravenous Q12H  . traZODone  100 mg Oral QHS   . nitroGLYCERIN 10 mcg/min (10/07/13 0502)    ASSESSMENT AND PLAN: Principal Problem:   ST elevation myocardial infarction (STEMI) of inferior wall, initial episode of care - s/p DES to RCA, 20-30% disease in other vessels, EF 30%, continue current care;   Active Problems:   Tobacco abuse - cessation encouraged    Essential hypertension - follow on current meds, lisinopril increased 05/10, BP lower today; hopefully pt will be willing to make med changes    Hyperlipidemia - PTA Pravachol 40, now Lipitor 40, ck LFTs; likely need change back to Pravachol at d/c for cost savings.    Cardiomyopathy, ischemic - echo performed, results pending, on BB, ACE, diuretic     COPD (chronic obstructive pulmonary disease) - no active wheezing, ck CXR    Chronic combined systolic and diastolic congestive heart failure, NYHA class 2; EF 30-35% - echo pending, see above    Morbid obesity - encourage low fat, ADA diet compliance    Type 2 diabetes mellitus treated with insulin - on SSI, DM education, ck A1c; Pt says checks CBG several times a day at home.    CAD S/P percutaneous coronary angioplasty - prior PCI to LAD; RCA PCI: new Xience Alpine DES  2.75 mm x 15 mm - see above    Normocytic anemia - stable/improving    Anticoagulation - pt on ASA/Plavix PTA, but p2y12 was elevated, not a Plavix responder, MD advise on Effient/Brilinta.    Lemont Fillers , PA-C 12:37 PM 10/08/2013

## 2013-10-08 NOTE — Progress Notes (Signed)
CARDIAC REHAB PHASE I   PRE:  Rate/Rhythm: 80 SR  BP:  Supine:   Sitting: 102/50  Standing:    SaO2: 93 RA  MODE:  Ambulation: 500 ft   POST:  Rate/Rhythm: 98  BP:  Supine:   Sitting: 104/50  Standing:    SaO2: 86 RA  [id number] Assisted X 1 to ambulate. Pt is DOE and took two standing rest rest stop related to dyspnea. Gait steady. She admits to not much walking and using motorized cart in grocery store. Room air sat after walk 86%, but rapidly increased to 90% with rest. RA sat after pt sitting 5 minutes 94%. Completed MI and stent education with pt. We discussed smoking cessation. I gave pt tips for quitting, coaching contact number and quit smart class information. Pt states that she had quit until recently started back after being stressed with her daughter.I encouraged her to use other things for stress than smoking. She seems motivated to quit. Pt agrees to [place]. CRP in [place], will send referral.  Rodney Langton RN 10/08/2013 9:56 AM

## 2013-10-08 NOTE — Care Management Note (Signed)
    Page 1 of 1   10/08/2013     3:13:33 PM CARE MANAGEMENT NOTE 10/08/2013  Patient:   [facility]   Account Number:  [id number]  Date Initiated:  10/08/2013  Documentation initiated by:  Luz Lex  Subjective/Objective Assessment:     Action/Plan:   Anticipated DC Date:     Anticipated DC Plan:        DC Planning Services  Medication Assistance  CM consult      Choice offered to / List presented to:             Status of service:  Completed, signed off Medicare Important Message given?   (If response is "NO", the following Medicare IM given date fields will be blank) Date Medicare IM given:   Date Additional Medicare IM given:    Discharge Disposition:    Per UR Regulation:    If discussed at Long Length of Stay Meetings, dates discussed:    Comments:  10-08-13 3:10pm Luz Lex, RNBSWN [phone] REcieved consult for medication assistance.  patient has Medicaid.  Effient is on Medicaid formulary for 3 dollars. Other meds are also for 3 dollars.

## 2013-10-09 ENCOUNTER — Encounter (HOSPITAL_COMMUNITY): Payer: Self-pay | Admitting: Physician Assistant

## 2013-10-09 DIAGNOSIS — F319 Bipolar disorder, unspecified: Secondary | ICD-10-CM

## 2013-10-09 DIAGNOSIS — J449 Chronic obstructive pulmonary disease, unspecified: Secondary | ICD-10-CM

## 2013-10-09 LAB — GLUCOSE, CAPILLARY
GLUCOSE-CAPILLARY: 133 mg/dL — AB (ref 70–99)
GLUCOSE-CAPILLARY: 258 mg/dL — AB (ref 70–99)

## 2013-10-09 MED ORDER — ASPIRIN 81 MG PO TABS: 81.0000 mg | ORAL_TABLET | Freq: Every day | ORAL | Status: DC

## 2013-10-09 MED ORDER — CARVEDILOL 12.5 MG PO TABS: 12.5000 mg | ORAL_TABLET | Freq: Two times a day (BID) | ORAL | Status: DC

## 2013-10-09 MED ORDER — FUROSEMIDE 80 MG PO TABS
80.0000 mg | ORAL_TABLET | Freq: Two times a day (BID) | ORAL | Status: DC
  Filled 2013-10-09 (×2): qty 1

## 2013-10-09 MED ORDER — PRASUGREL HCL 10 MG PO TABS: 10.0000 mg | ORAL_TABLET | Freq: Every day | ORAL | Status: DC

## 2013-10-09 MED ORDER — FUROSEMIDE 40 MG PO TABS: 80.0000 mg | ORAL_TABLET | Freq: Two times a day (BID) | ORAL | Status: DC

## 2013-10-09 MED ORDER — LISINOPRIL 10 MG PO TABS: 10.0000 mg | ORAL_TABLET | Freq: Every day | ORAL | Status: DC

## 2013-10-09 MED ORDER — PRASUGREL HCL 10 MG PO TABS
10.0000 mg | ORAL_TABLET | Freq: Every day | ORAL | Status: DC
Start: 2013-10-09 — End: 2013-10-09

## 2013-10-09 NOTE — Discharge Summary (Signed)
CARDIOLOGY DISCHARGE SUMMARY   Patient ID: Tamara Silva MRN: [id number] DOB/AGE: [birth year] 54 y.o.  Admit date: 10/06/2013 Discharge date: 10/09/2013  PCP: Philis Fendt, MD Primary Cardiologist: Dr. Ellyn Hack  Primary Discharge Diagnosis:    ST elevation myocardial infarction (STEMI) of inferior wall, initial episode of care - s/p Xience Alpine DES 2.75 mm x 15 mm to the RCA   Secondary Discharge Diagnosis:    Tobacco abuse   Essential hypertension   Hyperlipidemia   Cardiomyopathy, ischemic   COPD (chronic obstructive pulmonary disease)   Chronic combined systolic and diastolic congestive heart failure, NYHA class 2; EF 30-35%   Morbid obesity   Type 2 diabetes mellitus treated with insulin   CAD S/P percutaneous coronary angioplasty - prior stent to the LAD; RCA PCI: new Xience Alpine DES 2.75 mm x 15 mm    Normocytic anemia  PROCEDURES PERFORMED:  Left Heart Catheterization with Coronary Angiography  Percutaneous Coronary Intervention of the Distal RCA with a single Xience Alpine Drug-Eluting Stent (DES) 2.75 mm x 15 mm - postdilated to 3.0 mm  Right Common Femoral Vein Central Venous Line Placement 2 D Echocardiogram  Hospital Course: Tamara Silva is a 53 y.o. female with a history of CAD, LAD stent in 2006. She had chest pain and came to the hospital where she was having recurrent pain despite medical therapy and her ECG was abnormal. She was taken to the cath lab.   Cardiac catheterization results are below. She has a dominant RCA, and required a single Xience Alpine Drug-Eluting Stent (DES) 2.75 mm x 15 mm. there was also a large thrombus burden. She was continued on Angiomax for 2 hours. Her LV EDP was elevated so she was given one dose of IV Lasix. Her carvedilol dose was decreased because of the bradycardia. Her EF opacified poorly during the procedure so a 2-D echocardiogram was ordered.    She had been on Plavix prior to admission and the p2y12 test was  ordered. It was elevated, showing poor Plavix responsiveness. Her antiplatelet agent was changed to Effient.   She had recurrent bradycardia secondary to her MI, but did not require a temporary PPM as her heart rate improved once the vessel was open. Her beta blocker dose was decreased and her heart rate stabilized. She had no further problems with bradycardia.  She had some mild elevation in her creatinine on admission but this improved. This will be followed as an outpatient. She was noted to be anemic but she has a history of anemia with a normal MCV. Her hemoglobin and hematocrit remained stable during her hospital stay.  A hemoglobin A1c was checked and her blood sugars were managed during her stay with sliding scale insulin. Her hemoglobin A1c is 7.9. The previous value was 11 and the patient stated she had been doing well with blood sugar control. No medication changes to her home meds are indicated and she is to follow up with primary care.  Smoking cessation was counseled and reinforced. The patient stated that she was not smoking on a daily basis but would occasionally have a cigarette or 2 when experiencing family stress. She is encouraged to quit completely.  She was seen by cardiac rehabilitation and educated on MI restrictions, heart-healthy lifestyle modifications and exercise guidelines. She is encouraged to follow up with cardiac rehabilitation as an outpatient.  Her EF was low at cath and an echocardiogram also showed a decreased EF. She is on an ACE inhibitor, a beta  blocker and a diuretic. Her medications were adjusted for better blood pressure control.  Her oxygen saturation was checked with ambulation and it would decrease during ambulation. She was started on home oxygen. She has a history of COPD and is on home nebulizers. Her Lasix dose was also increased. She is to followup as an outpatient with her pulmonary M.D. and when she completes cardiac rehabilitation, may be a  candidate for pulmonary rehabilitation.  On 05/0 12, she was seen by Dr. Debara Pickett and all data were reviewed. No further inpatient workup is indicated and she is considered stable for discharge, to follow up as an outpatient.  Labs:   Lab Results  Component Value Date   WBC 10.1 10/07/2013   HGB 10.6* 10/07/2013   HCT 34.5* 10/07/2013   MCV 79.5 10/07/2013   PLT 292 10/07/2013     Recent Labs Lab 10/07/13 0305 10/08/13 0440  NA 138  --   K 3.8  --   CL 100  --   CO2 26  --   BUN 15  --   CREATININE 1.06  --   CALCIUM 8.2*  --   PROT  --  6.5  BILITOT  --  0.2*  ALKPHOS  --  82  ALT  --  12  AST  --  16  GLUCOSE 159*  --     Recent Labs  10/07/13 0214 10/07/13 1620  CKTOTAL 140  --   CKMB 2.9  --   TROPONINI <0.30 1.00*   Lipid Panel     Component Value Date/Time   CHOL 139 10/07/2013 0910   TRIG 92 10/07/2013 0910   HDL 54 10/07/2013 0910   CHOLHDL 2.6 10/07/2013 0910   VLDL 18 10/07/2013 0910   LDLCALC 67 10/07/2013 0910    Recent Labs  10/07/13 0015  INR 1.17   Lab Results  Component Value Date   HGBA1C 7.9* 10/07/2013      Radiology: Dg Chest 2 View 10/08/2013   CLINICAL DATA:  Myocardial infarction  EXAM: CHEST  2 VIEW  COMPARISON:  07/31/2013  FINDINGS: Stable scarring in the left mid lung and left base. Stable prominent interstitial markings. There are no areas consolidation. No pulmonary edema. No pleural effusion or pneumothorax.  Heart, mediastinum and hila are unremarkable.  The bony thorax is intact.  IMPRESSION: No acute cardiopulmonary disease.   Electronically Signed   By: Lajean Manes M.D.   On: 10/08/2013 08:07    Cardiac Cath: 10/07/2013 Left Ventriculography:  EF: Roughly 30 %; dilated  Wall Motion: Global hypokinesis. Poor opacification makes interpretation difficult. Coronary Anatomy: Right Coronary Dominant  Left Main: Large-caliber vessel that bifurcates into the LAD and Circumflex. Angiographically normal. LAD: Moderate caliber vessel  with a relatively proximal stent just after a proximal diagonal branch that has 20-30% in-stent restenosis the remainder the vessel continues as a moderate caliber vessel that reaches down to the apex. Angiographically normal after the stent.  D1: Small caliber very proximal vessel. Mild luminal irregularities.  D2: Small to moderate caliber vessel just prior to the stent in the LAD. There is roughly 20-30% ostial and proximal stenoses. The vessel bifurcates distally. Left Circumflex: Begins as a moderate-caliber vessel that gives off a very proximal high obtuse marginal/ramus intermedius. This has several small branches. The follow on circumflex is diminutive and terminates as a small second obtuse marginal with a very minimal AV groove circumflex. Diffuse mild luminal irregularities noted throughout the system.  RCA: Large-caliber, dominant vessel  with a downward takeoff. There is several branches. There is hazy 99% thrombotic stenosis in the distal vessel just prior to bifurcation into the Right Posterior Descending Artery (RPDA) and the Right Posterior AV Groove Branch (RPAV). There is TIMI 1 flow beyond the stenosis.  RPDA: Moderate caliber vessel with TIMI 1 flow distally;  post initial angioplasty revealed a significant posterior descending artery reaching almost to the apex the several septal perforators noted. There is actually a tandem vessel that comes off very proximally. There are extensive collaterals that appear to be going from the posterior descending artery to the LAD through the septum. RPL Sysytem:The RPAV begins as a moderate caliber vessel with a small proximal branch followed by a 650-60% stenosis. It then normalizes and gives off 3 posterior lateral branches. POST-OPERATIVE DIAGNOSIS:  Severe single-vessel disease with distal RCA 99% thrombotic subtotal occlusion.  Successful PCI of the distal RCA with a Xience Alpine 2.75 mm x 63mm postdilated to 3.1 mm  Mild in-stent restenosis  in the LAD stent.  Dilated Left Ventricle with global hypokinesis and EF of roughly 30%; poor opacification.  Resolve symptomatic bradycardia as a complication of the inferior STEMI  Severely elevated LVEDP PLAN OF CARE:  Admit to CCU for standard post PCI care. Continue Angiomax for 2 hours.  Would keep the venous line in place until lab draws are complete and guaranteed peripheral venous access is established.  Will dose IV Lasix x1 tonight and restart home Lasix at 60 mg by mouth twice a day in the morning.  Restart home medications but with reduced rate of carvedilol until heart rate is stabilized.  Check 2-D echocardiogram to assess EF given poor opacification with LV gram  Provided the patient does not have any additional heart failure or anginal symptoms, she could potentially be considered fast-track candidate despite her low EF.  Smoking cessation counseling required  Aggressive modification risk factors including diabetes, hypertension and lipid control as well as weight loss.   EKG: Vent. rate 72 BPM PR interval 148 ms QRS duration 98 ms QT/QTc 414/453 ms P-R-T axes 71 59 89  Echo: 10/07/2013 Study Conclusions - Left ventricle: The cavity size was severely dilated. Wall thickness was normal. Systolic function was moderately reduced. The estimated ejection fraction was in the range of 35% to 40%. There is moderate hypokinesis of the mid-distalanteroseptal and apical myocardium. Features are consistent with a pseudonormal left ventricular filling pattern, with concomitant abnormal relaxation and increased filling pressure (grade 2 diastolic dysfunction). - Mitral valve: Mild regurgitation. - Left atrium: The atrium was mildly to moderately dilated. - Atrial septum: No defect or patent foramen ovale was identified.   FOLLOW UP PLANS AND APPOINTMENTS Allergies  Allergen Reactions  . Nsaids Other (See Comments)    "I do not take NSAIDs d/t interfering with other meds"    . Plavix [Clopidogrel Bisulfate] Other (See Comments)    High PRU's - non-responder  . Metolazone Nausea And Vomiting     Medication List    STOP taking these medications       clopidogrel 75 MG tablet  Commonly known as:  PLAVIX      TAKE these medications       albuterol (2.5 MG/3ML) 0.083% nebulizer solution  Commonly known as:  PROVENTIL  Take 6 mLs (5 mg total) by nebulization every 4 (four) hours as needed for wheezing or shortness of breath.     albuterol 108 (90 BASE) MCG/ACT inhaler  Commonly known as:  PROVENTIL HFA;VENTOLIN  HFA  Inhale 2 puffs into the lungs every 6 (six) hours as needed for wheezing.     aspirin 81 MG tablet  Take 1 tablet (81 mg total) by mouth daily.     carvedilol 12.5 MG tablet  Commonly known as:  COREG  Take 1 tablet (12.5 mg total) by mouth 2 (two) times daily with a meal.     diazepam 10 MG tablet  Commonly known as:  VALIUM  Take 0.5 tablets (5 mg total) by mouth every 8 (eight) hours as needed for anxiety.     DSS 100 MG Caps  Take 100 mg by mouth 2 (two) times daily as needed for mild constipation.     fluticasone 50 MCG/ACT nasal spray  Commonly known as:  FLONASE  Place 2 sprays into the nose every morning.     furosemide 40 MG tablet  Commonly known as:  LASIX  Take 2 tablets (80 mg total) by mouth 2 (two) times daily.     insulin glargine 100 UNIT/ML injection  Commonly known as:  LANTUS  Inject 0.35 mLs (35 Units total) into the skin at bedtime.     insulin lispro 100 UNIT/ML injection  Commonly known as:  HUMALOG  Inject 0-20 Units into the skin 3 (three) times daily before meals. SSI based on BG     ipratropium-albuterol 0.5-2.5 (3) MG/3ML Soln  Commonly known as:  DUONEB  Take 3 mLs by nebulization every 6 (six) hours as needed (wheezing).     lisinopril 10 MG tablet  Commonly known as:  PRINIVIL,ZESTRIL  Take 1 tablet (10 mg total) by mouth daily.     nitroGLYCERIN 0.4 MG SL tablet  Commonly known as:   NITROSTAT  Place 1 tablet (0.4 mg total) under the tongue every 5 (five) minutes as needed for chest pain.     omeprazole 40 MG capsule  Commonly known as:  PRILOSEC  Take 40 mg by mouth 2 (two) times daily.     potassium chloride 10 MEQ tablet  Commonly known as:  K-DUR  Take 10 mEq by mouth 2 (two) times daily.     prasugrel 10 MG Tabs tablet  Commonly known as:  EFFIENT  Take 1 tablet (10 mg total) by mouth daily.     pravastatin 40 MG tablet  Commonly known as:  PRAVACHOL  Take 40 mg by mouth every evening.     senna 8.6 MG Tabs tablet  Commonly known as:  SENOKOT  Take 1 tablet (8.6 mg total) by mouth daily as needed for mild constipation.     traZODone 100 MG tablet  Commonly known as:  DESYREL  Take 100 mg by mouth at bedtime.        Discharge Orders   Future Appointments Provider Department Dept Phone   10/17/2013 11:00 AM Cecilie Kicks, NP [facility] [phone]   Future Orders Complete By Expires   ([place]) Call MD:  Anytime you have any of the following symptoms: 1) 3 pound weight gain in 24 hours or 5 pounds in 1 week 2) shortness of breath, with or without a dry hacking cough 3) swelling in the hands, feet or stomach 4) if you have to sleep on extra pillows at night in order to breathe.  As directed    Amb Referral to Cardiac Rehabilitation  As directed    Diet - low sodium heart healthy  As directed    Diet Carb Modified  As directed  Increase activity slowly  As directed      Follow-up Information   Follow up with [facility], NP On 10/17/2013. (at 11:00 am)    Specialty:  Cardiology   Contact information:   [address] [place] [address] [place] [address] [phone]       BRING ALL MEDICATIONS WITH YOU TO FOLLOW UP APPOINTMENTS  Time spent with patient to include physician time: 45 min Signed: Lonn Georgia, PA-C 10/09/2013, 12:49 PM Co-Sign MD

## 2013-10-09 NOTE — Progress Notes (Signed)
Assessment unchanged. Discussed D/C instructions with pt including f/u appointments and new medications. Verbalized understanding. RX called in to pharmacy. O2 tank delivered. IV and tele removed. Pt left with belongings accompanied by [facility].

## 2013-10-09 NOTE — Progress Notes (Signed)
SATURATION QUALIFICATIONS: (This note is used to comply with regulatory documentation for home oxygen)  Patient Saturations on Room Air at Rest = 94%  Patient Saturations on Room Air while Ambulating = 87%  Patient Saturations on 2 Liters of oxygen while Ambulating = 94%  Please briefly explain why patient needs home oxygen: Pt desaturates to 87% on room air while ambulating.

## 2013-10-09 NOTE — Progress Notes (Signed)
Caled to pts room for a PRN neb tx. Pt stated that she had ask for a tx last night but no one came. This Rt did not receive a call for a Prn TX. Pt states that the same thing had happened the night before. Pt beleaves that RN did not call.

## 2013-10-09 NOTE — Progress Notes (Signed)
CARDIAC REHAB PHASE I   PRE:  Rate/Rhythm: 83 SR with PVC's  BP:  Supine:   Sitting: 120/64  Standing:    SaO2: 100 RA  MODE:  Ambulation: 890 ft   POST:  Rate/Rhythm: 84  BP:  Supine:   Sitting: 120/70  Standing:    SaO2: 87 RA [id number] Pt tolerated ambulation fair. She is DOE and takes rest stops due to SOB. RA sat during walk dropped to 87%, with rest would return to 94%. Completed CHF and diet education with pt. She voices understanding. Reported sats to PA. I will ambulate pt again to place O2 on her when she desats to see if pt qualifies for O2.  Rodney Langton RN 10/09/2013 9:17 AM

## 2013-10-09 NOTE — Progress Notes (Addendum)
DAILY PROGRESS NOTE  Subjective:  No events overnight. Has desaturations while walking, but denies dyspnea.  Switched to [employer].  Objective:  Temp:  [97.8 F (36.6 C)-98.3 F (36.8 C)] 98.3 F (36.8 C) (05/12 [id number]) Pulse Rate:  [71-80] 71 (05/12 0638) Resp:  [18] 18 (05/12 0638) BP: (107-133)/(59-76) 107/67 mmHg (05/12 0638) SpO2:  [98 %-100 %] 98 % (05/12 [id number]) Weight change:   Intake/Output from previous day: 05/11 0701 - 05/12 0700 In: 480 [P.O.:480] Out: -   Intake/Output from this shift:    Medications: Current Facility-Administered Medications  Medication Dose Route Frequency Provider Last Rate Last Dose  . albuterol (PROVENTIL) (2.5 MG/3ML) 0.083% nebulizer solution 5 mg  5 mg Nebulization Q4H PRN Leonie Man, MD   5 mg at 10/08/13 1716  . aspirin chewable tablet 81 mg  81 mg Oral Daily Leonie Man, MD   81 mg at 10/08/13 1203  . atorvastatin (LIPITOR) tablet 40 mg  40 mg Oral q1800 Leonie Man, MD   40 mg at 10/08/13 1716  . carvedilol (COREG) tablet 12.5 mg  12.5 mg Oral BID WC Leonie Man, MD   12.5 mg at 10/09/13 0858  . diazepam (VALIUM) tablet 5 mg  5 mg Oral Q8H PRN Leonie Man, MD      . docusate sodium (COLACE) capsule 100 mg  100 mg Oral BID PRN Leonie Man, MD      . fluticasone [facility]) 50 MCG/ACT nasal spray 2 spray  2 spray Each Nare q morning - 10a Leonie Man, MD   2 spray at 10/08/13 1203  . furosemide (LASIX) tablet 60 mg  60 mg Oral BID Leonie Man, MD   60 mg at 10/09/13 0858  . insulin aspart (novoLOG) injection 0-20 Units  0-20 Units Subcutaneous TID PC & HS Rogelia Mire, NP   3 Units at 10/09/13 [phone]  . insulin glargine (LANTUS) injection 35 Units  35 Units Subcutaneous QHS Leonie Man, MD   35 Units at 10/08/13 2126  . ipratropium-albuterol (DUONEB) 0.5-2.5 (3) MG/3ML nebulizer solution 3 mL  3 mL Nebulization Q6H PRN Leonie Man, MD   3 mL at 10/09/13 0544  . lisinopril (PRINIVIL,ZESTRIL)  tablet 10 mg  10 mg Oral Daily Jessee Avers, MD   10 mg at 10/08/13 1202  . morphine 2 MG/ML injection 2 mg  2 mg Intravenous Q1H PRN Leonie Man, MD   2 mg at 10/07/13 [id number]  . nitroGLYCERIN (NITROSTAT) SL tablet 0.4 mg  0.4 mg Sublingual Q5 min PRN Leonie Man, MD      . nitroGLYCERIN 0.2 mg/mL in dextrose 5 % infusion  2-200 mcg/min Intravenous Titrated Lamar Sprinkles, MD 3 mL/hr at 10/07/13 0502 10 mcg/min at 10/07/13 0502  . pantoprazole (PROTONIX) EC tablet 40 mg  40 mg Oral Daily Leonie Man, MD   40 mg at 10/08/13 1203  . polyethylene glycol (MIRALAX / GLYCOLAX) packet 17 g  17 g Oral Daily Rogelia Mire, NP   17 g at 10/08/13 2000  . potassium chloride (K-DUR) CR tablet 10 mEq  10 mEq Oral BID Leonie Man, MD   10 mEq at 10/08/13 2126  . prasugrel (EFFIENT) tablet 10 mg  10 mg Oral Daily Pixie Casino, MD      . senna [facility]) tablet 8.6 mg  1 tablet Oral Daily PRN Leonie Man, MD   8.6 mg at 10/08/13 2126  .  sodium chloride 0.9 % injection 3 mL  3 mL Intravenous Q12H Leonie Man, MD   3 mL at 10/08/13 2126  . sodium chloride 0.9 % injection 3 mL  3 mL Intravenous PRN Leonie Man, MD      . traZODone (DESYREL) tablet 100 mg  100 mg Oral QHS Leonie Man, MD   100 mg at 10/08/13 2126    Physical Exam: General appearance: alert and no distress Lungs: clear to auscultation bilaterally Heart: regular rate and rhythm, S1, S2 normal, no murmur, click, rub or gallop Extremities: extremities normal, atraumatic, no cyanosis or edema  Lab Results: Results for orders placed during the hospital encounter of 10/06/13 (from the past 48 hour(s))  GLUCOSE, CAPILLARY     Status: Abnormal   Collection Time    10/07/13 11:25 AM      Result Value Ref Range   Glucose-Capillary 221 (*) 70 - 99 mg/dL  TROPONIN I     Status: Abnormal   Collection Time    10/07/13  4:20 PM      Result Value Ref Range   Troponin I 1.00 (*) <0.30 ng/mL   Comment:             Due to the release kinetics of cTnI,     a negative result within the first hours     of the onset of symptoms does not rule out     myocardial infarction with certainty.     If myocardial infarction is still suspected,     repeat the test at appropriate intervals.     CRITICAL RESULT CALLED TO, READ BACK BY AND VERIFIED WITH:     DRAPER J,RN 10/07/13 1720 WAYK  GLUCOSE, CAPILLARY     Status: Abnormal   Collection Time    10/07/13  4:50 PM      Result Value Ref Range   Glucose-Capillary 203 (*) 70 - 99 mg/dL  GLUCOSE, CAPILLARY     Status: Abnormal   Collection Time    10/07/13  8:31 PM      Result Value Ref Range   Glucose-Capillary 260 (*) 70 - 99 mg/dL   Comment 1 Documented in Chart     Comment 2 Notify RN    HEPATIC FUNCTION PANEL     Status: Abnormal   Collection Time    10/08/13  4:40 AM      Result Value Ref Range   Total Protein 6.5  6.0 - 8.3 g/dL   Albumin 2.9 (*) 3.5 - 5.2 g/dL   AST 16  0 - 37 U/L   ALT 12  0 - 35 U/L   Alkaline Phosphatase 82  39 - 117 U/L   Total Bilirubin 0.2 (*) 0.3 - 1.2 mg/dL   Bilirubin, Direct <0.2  0.0 - 0.3 mg/dL   Indirect Bilirubin NOT CALCULATED  0.3 - 0.9 mg/dL  GLUCOSE, CAPILLARY     Status: Abnormal   Collection Time    10/08/13  5:53 AM      Result Value Ref Range   Glucose-Capillary 134 (*) 70 - 99 mg/dL   Comment 1 Documented in Chart     Comment 2 Notify RN    GLUCOSE, CAPILLARY     Status: Abnormal   Collection Time    10/08/13 11:22 AM      Result Value Ref Range   Glucose-Capillary 245 (*) 70 - 99 mg/dL  GLUCOSE, CAPILLARY     Status: Abnormal   Collection  Time    10/08/13  4:19 PM      Result Value Ref Range   Glucose-Capillary 56 (*) 70 - 99 mg/dL  GLUCOSE, CAPILLARY     Status: None   Collection Time    10/08/13  4:37 PM      Result Value Ref Range   Glucose-Capillary 71  70 - 99 mg/dL  GLUCOSE, CAPILLARY     Status: Abnormal   Collection Time    10/08/13  9:02 PM      Result Value Ref Range    Glucose-Capillary 139 (*) 70 - 99 mg/dL  GLUCOSE, CAPILLARY     Status: Abnormal   Collection Time    10/09/13  6:31 AM      Result Value Ref Range   Glucose-Capillary 133 (*) 70 - 99 mg/dL    Imaging: Dg Chest 2 View  10/08/2013   CLINICAL DATA:  Myocardial infarction  EXAM: CHEST  2 VIEW  COMPARISON:  07/31/2013  FINDINGS: Stable scarring in the left mid lung and left base. Stable prominent interstitial markings. There are no areas consolidation. No pulmonary edema. No pleural effusion or pneumothorax.  Heart, mediastinum and hila are unremarkable.  The bony thorax is intact.  IMPRESSION: No acute cardiopulmonary disease.   Electronically Signed   By: Lajean Manes M.D.   On: 10/08/2013 08:07    Assessment:  1. Principal Problem: 2.   ST elevation myocardial infarction (STEMI) of inferior wall, initial episode of care 3. Active Problems: 4.   Tobacco abuse 5.   Essential hypertension 6.   Hyperlipidemia 7.   Cardiomyopathy, ischemic 8.   COPD (chronic obstructive pulmonary disease) 9.   Chronic combined systolic and diastolic congestive heart failure, NYHA class 2; EF 30-35% 10.   Morbid obesity 11.   Type 2 diabetes mellitus treated with insulin 12.   CAD S/P percutaneous coronary angioplasty - prior PCI to LAD; RCA PCI: new Xience Alpine DES 2.75 mm x 15 mm  13.   Normocytic anemia 14.  Hypoxia  Plan:  1. Ms. Wickersham feels well and is ready to go home. Her ambulatory O2 saturation is low (87% with ambulation) - she wishes to go on oxygen, which I agree with. I will increase her diuretics as this is mostly likely explained by pulmonary congestion or possibly COPD, but she is not wheezing. Switched to [employer]. Okay for discharge home today. Follow-up with Dr. Ellyn Hack.   Time Spent Directly with Patient:  15 minutes  Length of Stay:  LOS: 3 days   Pixie Casino, MD, [place] Attending Cardiologist [place] 10/09/2013, 9:30 AM

## 2013-10-09 NOTE — Progress Notes (Signed)
Clinical Social Work Department BRIEF PSYCHOSOCIAL ASSESSMENT 10/09/2013  Patient:  Tamara Silva     Account Number:  [id number]     Admit date:  10/06/2013  Clinical Social Worker:  Megan Salon  Date/Time:  10/09/2013 10:50 AM  Referred by:  Care Management  Date Referred:  10/09/2013 Referred for  Psychosocial assessment   Other Referral:   Interview type:  Patient Other interview type:    PSYCHOSOCIAL DATA Living Status:  FAMILY Admitted from facility:   Level of care:   Primary support name:  Tamara Silva Primary support relationship to patient:  CHILD, ADULT Degree of support available:   Good    CURRENT CONCERNS Current Concerns  Other - See comment   Other Concerns:   [place] / PLAN CSW received consult to provide outpatient resources to patient regarding Mental Health. CSW went into room and introduced self and social work role .CSW explained reason for visit was for support and to provide resources to patient. Patient states she would like resources for Mental Health, stating that she wants to switch over her medication and speak to a Mental Health counselor. CSW provided [employer]. Patient states shes been there before and they did not help her and will not switch her medications. CSW then encouraged patient to call the back of her Medicaid Insurance card to get a list of Mental Health providers that are in network with her insurance. Patient states she will do that to make an appointment and thanked Education officer, museum for visit. Patient states there are no longer any other needs at this time.   Assessment/plan status:  Information/Referral to [employer] Other assessment/ plan:   Information/referral to community resources:   [employer]    PATIENT'S/FAMILY'S RESPONSE TO PLAN OF CARE: Patient states she will follow up [place] resources and make an appointment to see a provider. CSW signing  off at this time.        Jeanette Caprice, MSW, [place]

## 2013-10-09 NOTE — Clinical Social Work Psych Note (Signed)
Psych CSW gave report to unit CSW (pt not currently referred to psychiatry service line) for the need for outpatient mental health services.  Psych CSW provided unit CSW with [employer] along with the 24-hr crisis # and [employer].  Unit CSW aware of the referral.  Psych CSW signing off.  Nonnie Done, [place] [phone]  Clinical Social Work

## 2013-10-11 NOTE — Progress Notes (Signed)
CORRECTION: PLEASE NOTE, PATIENT'S CARDIOLOGIST IS DR. Gwenlyn Found

## 2013-10-17 ENCOUNTER — Ambulatory Visit: Payer: Self-pay | Admitting: Cardiology

## 2013-10-18 NOTE — Telephone Encounter (Signed)
Closed encounter °

## 2013-10-31 ENCOUNTER — Telehealth (HOSPITAL_COMMUNITY): Payer: Self-pay | Admitting: Cardiac Rehabilitation

## 2013-10-31 NOTE — Telephone Encounter (Signed)
pc to pt to discuss enrolling in cardiac rehab program. Pt states she is currently seeking new cardiologist as she no longer wishes to be followed in Dr. Kennon Holter office.  Explained to pt the importance of hospital followup. Pt states she has contacted attorney regarding her care with Dr. Gwenlyn Found.   Pt accepting of my offer to contact new cardiologist on her behalf to schedule cardiology appointment.  Spoke to Dr. Zenia Resides office to schedule new patient hospital followup up appointment, who requested hospital medical records and primary care referral per insurance criteria.   Release of medical records form sent to pt home address for pt to sign to have records forwarded to Dr. Terrence Dupont, (pt request to have form mailed versus come to facility to sign).  Pt informed once we receive release of medical records form and PCP authorization we will be ready to schedule Dr. Terrence Dupont appointment.  Cardiac rehab appointments will be scheduled once she has been seen by cardiology and signed order is received from cardiologist. Pt states she has adequate supply of discharge medications, primarily effient.  Pt instructed to avoid missed doses of medication, primarily effient.  Pt instructed to call cardiac rehab if she has difficulty obtaining medication.   Understanding verbalized.

## 2013-11-02 ENCOUNTER — Ambulatory Visit: Payer: Self-pay | Admitting: Cardiovascular Disease

## 2013-11-12 ENCOUNTER — Ambulatory Visit: Payer: Self-pay | Admitting: Cardiology

## 2013-12-05 ENCOUNTER — Telehealth (HOSPITAL_COMMUNITY): Payer: Self-pay | Admitting: Cardiac Rehabilitation

## 2013-12-05 NOTE — Telephone Encounter (Signed)
pc received from pt to report she has arranged transportation with [facility] for cardiac rehab services.

## 2013-12-06 NOTE — Telephone Encounter (Signed)
Encounter complete. 

## 2013-12-13 ENCOUNTER — Telehealth (HOSPITAL_COMMUNITY): Payer: Self-pay | Admitting: *Deleted

## 2013-12-13 ENCOUNTER — Inpatient Hospital Stay (HOSPITAL_COMMUNITY): Admission: RE | Admit: 2013-12-13 | Payer: Self-pay | Source: Ambulatory Visit

## 2013-12-19 ENCOUNTER — Encounter (HOSPITAL_COMMUNITY): Payer: Medicaid Other

## 2013-12-20 ENCOUNTER — Encounter (HOSPITAL_COMMUNITY)
Admission: RE | Admit: 2013-12-20 | Discharge: 2013-12-20 | Disposition: A | Discharge status: Home / Self Care | Payer: Medicaid Other | Source: Ambulatory Visit | Attending: Cardiology | Admitting: Cardiology

## 2013-12-20 DIAGNOSIS — I5042 Chronic combined systolic (congestive) and diastolic (congestive) heart failure: Secondary | ICD-10-CM | POA: Insufficient documentation

## 2013-12-20 DIAGNOSIS — I2589 Other forms of chronic ischemic heart disease: Secondary | ICD-10-CM | POA: Insufficient documentation

## 2013-12-20 DIAGNOSIS — I2119 ST elevation (STEMI) myocardial infarction involving other coronary artery of inferior wall: Secondary | ICD-10-CM | POA: Insufficient documentation

## 2013-12-20 DIAGNOSIS — Z5189 Encounter for other specified aftercare: Secondary | ICD-10-CM | POA: Insufficient documentation

## 2013-12-20 DIAGNOSIS — I2510 Atherosclerotic heart disease of native coronary artery without angina pectoris: Secondary | ICD-10-CM | POA: Insufficient documentation

## 2013-12-20 NOTE — Progress Notes (Signed)
Cardiac Rehab Medication Review by a Pharmacist  Does the patient  feel that his/her medications are working for him/her?  yes  Has the patient been experiencing any side effects to the medications prescribed?  yes  Does the patient measure his/her own blood pressure or blood glucose at home?  Yes; Blood glucoses have been running "high" according to the patient.  Blood pressure is "okay"  Does the patient have any problems obtaining medications due to transportation or finances?   no  Understanding of regimen: fair Understanding of indications: good Potential of compliance: fair    Pharmacist comments:  Patient was confused about medications and drug allergies.  Compliance may be an issue, especially with insulin and COPD medications. Consider adding a long-acting medication for COPD control.  Patient stated that spiriva helped greatly in the past.  Encouraged patient to cut 25 mg carvedilol tablets in half and explained the risks of taking too high of a carvedilol dose.    Tamara Silva 12/20/2013 8:33 AM

## 2013-12-21 ENCOUNTER — Encounter (HOSPITAL_COMMUNITY): Payer: Medicaid Other

## 2013-12-24 ENCOUNTER — Encounter (HOSPITAL_COMMUNITY): Payer: Self-pay

## 2013-12-24 ENCOUNTER — Encounter (HOSPITAL_COMMUNITY)
Admission: RE | Admit: 2013-12-24 | Discharge: 2013-12-24 | Disposition: A | Discharge status: Home / Self Care | Payer: Medicaid Other | Source: Ambulatory Visit | Attending: Cardiology | Admitting: Cardiology

## 2013-12-24 DIAGNOSIS — Z5189 Encounter for other specified aftercare: Secondary | ICD-10-CM | POA: Diagnosis present

## 2013-12-24 DIAGNOSIS — I2589 Other forms of chronic ischemic heart disease: Secondary | ICD-10-CM | POA: Diagnosis present

## 2013-12-24 DIAGNOSIS — I2119 ST elevation (STEMI) myocardial infarction involving other coronary artery of inferior wall: Secondary | ICD-10-CM | POA: Diagnosis not present

## 2013-12-24 DIAGNOSIS — I5042 Chronic combined systolic (congestive) and diastolic (congestive) heart failure: Secondary | ICD-10-CM | POA: Diagnosis not present

## 2013-12-24 DIAGNOSIS — I2510 Atherosclerotic heart disease of native coronary artery without angina pectoris: Secondary | ICD-10-CM | POA: Diagnosis not present

## 2013-12-24 LAB — GLUCOSE, CAPILLARY
GLUCOSE-CAPILLARY: 64 mg/dL — AB (ref 70–99)
GLUCOSE-CAPILLARY: 90 mg/dL (ref 70–99)
Glucose-Capillary: 129 mg/dL — ABNORMAL HIGH (ref 70–99)

## 2013-12-24 NOTE — Progress Notes (Addendum)
Pt arrived at cardiac rehab with CBG-64. Pt states she took novalog 30units at 4:00am because home CBG-265.  Pt did not eat at that time.  Pt ate 1/2 small moon pie at 7:00am in preparation for cardiac rehab.  Pt given lemonade, 15 minute recheck CBG-90.  Pt given peanut butter crackers.  20 minute recheck CBG-126.  Pt did not exercise. Pt will plan to exercise on Wednesday December 26, 2013.  Pt met with Derek Mound, RD to discuss diabetes, exercise and meal planning.    Pt reports she took SL NTG x1 this am in preparation for exercise. Pt states she has been previously advised to take NTG 1 hour before exercise or strenous activity. Pt denies chest pain, dyspnea or anginal equivalent this AM.  Pt also reports she "almost went to the ER" last night. Pt states she was out of furosemide but she was able to get some from a friend.  According to pt she has been taking lasix 62m 3 tabs BID for 10 days to decrease "swelling".  However pt states her friend gave her 139mtablets which according to pt  work better for her.   Pt denies dyspnea, edema, weight gain.  There is discrepancy in medication list from hospital and Dr. GaEinar Gipffice note. Lastly pt states she is not taking novolog and levemir as directed because she believes they do not work. Pt states she has a 3 month supply humalog and lantus which plans to use instead.  Pt has been asked to bring all medication bottles to next cardiac rehab session for medication reconcilation.  Dr. GaEinar Gipurse made aware pt out of lasix from taking additional doses. Dr. GaEinar Gipoes not prescribe lasix.  Dr. Av[facility] LatinoPCP office made aware of pt need for additional lasix.  Left message for nurse to call pt to assess need.    PHQ-11. Pt reports history of bipolar disorder, anxiety and post traumatic stress disorder for which she is currently being treated at [place] Pt verbalizes good symptom management on current regimen.  Pt verbalizes good family and church  support system.  Although the pt has barriers to rehab participation she is excited about the program and is demonstrating willingness to participate.  Pt independently arranged her transportation services.  Pt offered emotional support and reassurance.  Pt did not return homework packet today with QOL/psychosocial assessment.  Pt instructed to return this paperwork at next class. Understanding verbalized.

## 2013-12-24 NOTE — Progress Notes (Signed)
Nutrition Note Spoke with pt per RN request. Pt is ordered Novolog and Levemir, which pt states "I don't think they work." Pt states she has "about a 3 month supply of Humalog and Lantus" that she plans on using. Pt took 45 units of Lantus before bed "since my blood sugar was 295 mg/dL." Pt reports she ate a peanut butter and regular strawberry jelly sandwich on whole grain bread. Pt commented "I increased the Lantus since they didn't give me a sliding scale for it." Pt CBG at 4 am this morning was "265 mg/dL." Pt reports waking up with a headache "which I knew meant my sugar was high." Pt took "25-30 units of Humalog, went back to bed and woke up at 5:45 am." Pt drank her coffee with Sugar Free Hazelnut Creamer after waking and waited until "riding over here to eat 1/2 a moon pie so my sugar wouldn't be too low." Pre-exercise CBG 64 mg/dL. Pt instructed to take her Novolog right before she eats rather than several hours later and to eat before coming to Cardiac rehab. This Probation officer educated pt re: medications ordered, their action, and reasoning behind prescription dosage. Pt expressed fair understanding of the information reviewed. Continue client-centered nutrition education by RD as part of interdisciplinary care.  Monitor and evaluate progress toward nutrition goal with team.  Derek Mound, M.Ed, RD, LDN, CDE 12/24/2013 10:14 AM  Addendum: When this writer was taking pt to be discharged, pt stated "I almost went to the ED last night because I ran out of Lasix." Pt reports she took some of her neighbors Lasix, "which I knew I shouldn't do, but I did." This Probation officer encouraged pt to call her PCP today. Pt declined to call PCP because "I didn't like what he said at my last visit. I said I'd see him at my next appointment." Pt states that she frequently takes extra Lasix "to keep fluid from being around my heart." Pt said she also take potassium supplement with Lasix, but pt is not running low on potassium  supplements. Andi Hence, RN informed of the above conversation.

## 2013-12-26 ENCOUNTER — Encounter (HOSPITAL_COMMUNITY): Payer: Self-pay | Admitting: Emergency Medicine

## 2013-12-26 ENCOUNTER — Telehealth (HOSPITAL_COMMUNITY): Payer: Self-pay | Admitting: Cardiac Rehabilitation

## 2013-12-26 ENCOUNTER — Encounter (HOSPITAL_COMMUNITY): Payer: Medicaid Other

## 2013-12-26 ENCOUNTER — Emergency Department (HOSPITAL_COMMUNITY)
Admission: EM | Admit: 2013-12-26 | Discharge: 2013-12-26 | Disposition: A | Discharge status: Home / Self Care | Payer: Medicaid Other | Attending: Emergency Medicine | Admitting: Emergency Medicine

## 2013-12-26 ENCOUNTER — Emergency Department (HOSPITAL_COMMUNITY): Payer: Medicaid Other

## 2013-12-26 ENCOUNTER — Telehealth (HOSPITAL_COMMUNITY): Payer: Self-pay | Admitting: Internal Medicine

## 2013-12-26 DIAGNOSIS — F319 Bipolar disorder, unspecified: Secondary | ICD-10-CM | POA: Insufficient documentation

## 2013-12-26 DIAGNOSIS — Z794 Long term (current) use of insulin: Secondary | ICD-10-CM | POA: Diagnosis not present

## 2013-12-26 DIAGNOSIS — Z86711 Personal history of pulmonary embolism: Secondary | ICD-10-CM | POA: Diagnosis not present

## 2013-12-26 DIAGNOSIS — Z7982 Long term (current) use of aspirin: Secondary | ICD-10-CM | POA: Diagnosis not present

## 2013-12-26 DIAGNOSIS — I509 Heart failure, unspecified: Secondary | ICD-10-CM | POA: Insufficient documentation

## 2013-12-26 DIAGNOSIS — IMO0002 Reserved for concepts with insufficient information to code with codable children: Secondary | ICD-10-CM | POA: Diagnosis not present

## 2013-12-26 DIAGNOSIS — Z79899 Other long term (current) drug therapy: Secondary | ICD-10-CM | POA: Diagnosis not present

## 2013-12-26 DIAGNOSIS — Z87891 Personal history of nicotine dependence: Secondary | ICD-10-CM | POA: Insufficient documentation

## 2013-12-26 DIAGNOSIS — I2510 Atherosclerotic heart disease of native coronary artery without angina pectoris: Secondary | ICD-10-CM | POA: Insufficient documentation

## 2013-12-26 DIAGNOSIS — Z7901 Long term (current) use of anticoagulants: Secondary | ICD-10-CM | POA: Diagnosis not present

## 2013-12-26 DIAGNOSIS — J441 Chronic obstructive pulmonary disease with (acute) exacerbation: Secondary | ICD-10-CM

## 2013-12-26 DIAGNOSIS — E119 Type 2 diabetes mellitus without complications: Secondary | ICD-10-CM | POA: Insufficient documentation

## 2013-12-26 DIAGNOSIS — R0602 Shortness of breath: Secondary | ICD-10-CM | POA: Insufficient documentation

## 2013-12-26 DIAGNOSIS — E7800 Pure hypercholesterolemia, unspecified: Secondary | ICD-10-CM | POA: Insufficient documentation

## 2013-12-26 DIAGNOSIS — I10 Essential (primary) hypertension: Secondary | ICD-10-CM | POA: Insufficient documentation

## 2013-12-26 DIAGNOSIS — Z9861 Coronary angioplasty status: Secondary | ICD-10-CM | POA: Diagnosis not present

## 2013-12-26 DIAGNOSIS — K219 Gastro-esophageal reflux disease without esophagitis: Secondary | ICD-10-CM | POA: Diagnosis not present

## 2013-12-26 DIAGNOSIS — J45901 Unspecified asthma with (acute) exacerbation: Secondary | ICD-10-CM

## 2013-12-26 LAB — CBC WITH DIFFERENTIAL/PLATELET
BASOS ABS: 0 10*3/uL (ref 0.0–0.1)
Basophils Relative: 0 % (ref 0–1)
Eosinophils Absolute: 0.1 10*3/uL (ref 0.0–0.7)
Eosinophils Relative: 1 % (ref 0–5)
HCT: 36.8 % (ref 36.0–46.0)
Hemoglobin: 11.2 g/dL — ABNORMAL LOW (ref 12.0–15.0)
Lymphocytes Relative: 36 % (ref 12–46)
Lymphs Abs: 2.5 10*3/uL (ref 0.7–4.0)
MCH: 24.4 pg — ABNORMAL LOW (ref 26.0–34.0)
MCHC: 30.4 g/dL (ref 30.0–36.0)
MCV: 80.2 fL (ref 78.0–100.0)
MONOS PCT: 11 % (ref 3–12)
Monocytes Absolute: 0.8 10*3/uL (ref 0.1–1.0)
Neutro Abs: 3.6 10*3/uL (ref 1.7–7.7)
Neutrophils Relative %: 52 % (ref 43–77)
PLATELETS: 297 10*3/uL (ref 150–400)
RBC: 4.59 MIL/uL (ref 3.87–5.11)
RDW: 16.9 % — AB (ref 11.5–15.5)
WBC: 7 10*3/uL (ref 4.0–10.5)

## 2013-12-26 LAB — BASIC METABOLIC PANEL
ANION GAP: 12 (ref 5–15)
BUN: 11 mg/dL (ref 6–23)
CALCIUM: 8.6 mg/dL (ref 8.4–10.5)
CHLORIDE: 99 meq/L (ref 96–112)
CO2: 29 mEq/L (ref 19–32)
Creatinine, Ser: 0.94 mg/dL (ref 0.50–1.10)
GFR calc Af Amer: 78 mL/min — ABNORMAL LOW (ref >90)
GFR calc non Af Amer: 68 mL/min — ABNORMAL LOW (ref >90)
Glucose, Bld: 236 mg/dL — ABNORMAL HIGH (ref 70–99)
Potassium: 4 mEq/L (ref 3.7–5.3)
Sodium: 140 mEq/L (ref 137–147)

## 2013-12-26 LAB — PRO B NATRIURETIC PEPTIDE: Pro B Natriuretic peptide (BNP): 284.2 pg/mL — ABNORMAL HIGH (ref 0–125)

## 2013-12-26 LAB — TROPONIN I: Troponin I: <0.3 ng/mL (ref <0.30)

## 2013-12-26 MED ORDER — ALBUTEROL (5 MG/ML) CONTINUOUS INHALATION SOLN
10.0000 mg/h | INHALATION_SOLUTION | Freq: Once | RESPIRATORY_TRACT | Status: AC
  Administered 2013-12-26: 10 mg/h via RESPIRATORY_TRACT
  Filled 2013-12-26: qty 20

## 2013-12-26 MED ORDER — PREDNISONE 20 MG PO TABS: 60.0000 mg | ORAL_TABLET | Freq: Every day | ORAL | Status: DC

## 2013-12-26 NOTE — ED Notes (Signed)
Pt arrives from home with [facility], tried to used rescue inhalers and was able to get some short term relief, woke up with continued SHOB. Atrovent and albuterol treatment given by EMS PTA. VS WDL.

## 2013-12-26 NOTE — Discharge Instructions (Signed)

## 2013-12-26 NOTE — ED Notes (Signed)
Per [facility] pt called 911 due to waking this an with SOB. EMS reports pt was labored upon their arrival and bilateral lower lobes are diminished with some rhonchi. Solumedrol 125 mg,  Albuterol 10 mg and Atrovent 5 mg given en route. VS BP 138/86, HR 88, 88% RA, 98% after breathing treatment. 20G SL LAC

## 2013-12-26 NOTE — ED Notes (Signed)
MD Otter at bedside.

## 2013-12-26 NOTE — ED Notes (Signed)
Pt presents with SOB and productive cough with tan colored sputum x2 days. Pt has an at home inhaler and nebulizer and states she is not having any relief

## 2013-12-26 NOTE — Telephone Encounter (Signed)
pc received from pt who states she was in ED last night released this AM for dyspnea, treated for COPD exercerbation. Pt states she did not obtain lasix refill as requested by Dr. Lura Em, as she states she no longer is followed in this practice and is now being treated by Dr. Regina Eck.  PC to Dr. Owens Shark to request lasix refill on pt behalf, advise nurse pt has been taking additional doses of lasix therefore ran out early and pt has been treated in ED.  Clearance to return to cardiac rehab faxed to Dr. Owens Shark.  [phone]).  Pt instructed to contact Dr. Owens Shark for hospital f/u appt and to also request lasix refill.  Understanding verbalized

## 2013-12-26 NOTE — ED Provider Notes (Signed)
CSN: [id number]     Arrival date & time 12/26/13  [id number] History   First MD Initiated Contact with Patient 12/26/13 0330     Chief Complaint  Patient presents with  . Shortness of Breath     (Consider location/radiation/quality/duration/timing/severity/associated sxs/prior Treatment) HPI 53 year old female presents to emergency department via EMS from home with complaint of shortness of breath.  Patient reports she has had increased shortness of breath wheezing and cough over the last 2-3 days.  She's been trying to treat at home with her DuoNeb treatments, but has not been improving.  She reports mild chest tightness.  She denies any lower extremity swelling, orthopnea or dyspnea on exertion.  She denies any fever or chills.  Patient has history of diabetes, PE, hypercholesterolemia hypertension COPD CHF.  Patient reports that she has recently run out of her Lasix and has been using her neighbors.  Patient has been taking her Lasix as needed, sometimes doubling her doubling her dose Past Medical History  Diagnosis Date  . Diabetes mellitus without complication   . PE (pulmonary embolism)   . Hypercholesterolemia   . Hypertension   . COPD (chronic obstructive pulmonary disease)   . Asthma   . Coronary artery disease   . CHF (congestive heart failure)   . Mental disorder     BIPOLAR  . Shortness of breath   . GERD (gastroesophageal reflux disease)   . Neuromuscular disorder     DIABETIC NEUROPATHY  . Ischemic cardiomyopathy   . Bipolar disorder   . Post-traumatic stress syndrome    Past Surgical History  Procedure Laterality Date  . Coronary angioplasty with stent placement      CAD in 2006 x 2 and 2009 2 more- place din REx in [place] and [place] [facility]  . Esophagogastroduodenoscopy (egd) with propofol N/A 08/03/2013    Procedure: ESOPHAGOGASTRODUODENOSCOPY (EGD) WITH PROPOFOL;  Surgeon: Jeryl Columbia, MD;  Location: [facility];  Service: Endoscopy;  Laterality: N/A;  . Colonoscopy with  propofol N/A 08/03/2013    Procedure: COLONOSCOPY WITH PROPOFOL;  Surgeon: Jeryl Columbia, MD;  Location: [facility];  Service: Endoscopy;  Laterality: N/A;  . Coronary angioplasty with stent placement  10/07/2013    Xience Alpine DES 2.75  mm x 15  mm   Family History  Problem Relation Age of Onset  . Stroke Mother   . Cancer Father    History  Substance Use Topics  . Smoking status: Former Smoker    Types: Cigarettes    Quit date: 11/24/2012  . Smokeless tobacco: Never Used  . Alcohol Use: No   OB History   Grav Para Term Preterm Abortions TAB SAB Ect Mult Living                 Review of Systems   See History of Present Illness; otherwise all other systems are reviewed and negative  Allergies  Nsaids; Plavix; and Metolazone  Home Medications   Prior to Admission medications   Medication Sig Start Date End Date Taking? Authorizing Provider  acetaminophen (TYLENOL) 500 MG tablet Take 1,000 mg by mouth every 6 (six) hours as needed for mild pain or moderate pain (Lower back pain).    Yes Historical Provider, MD  albuterol (PROVENTIL HFA;VENTOLIN HFA) 108 (90 BASE) MCG/ACT inhaler Inhale 2 puffs into the lungs every 6 (six) hours as needed for wheezing or shortness of breath.  06/29/13  Yes Dorie Rank, MD  albuterol (PROVENTIL) (2.5 MG/3ML) 0.083% nebulizer solution Take  6 mLs (5 mg total) by nebulization every 4 (four) hours as needed for wheezing or shortness of breath. 06/24/13  Yes Jennifer L Piepenbrink, PA-C  aspirin 81 MG tablet Take 1 tablet (81 mg total) by mouth daily. 10/09/13  Yes Rhonda G Barrett, PA-C  carvedilol (COREG) 25 MG tablet Take 12.5 mg by mouth 2 (two) times daily with a meal.    Yes Historical Provider, MD  clopidogrel (PLAVIX) 75 MG tablet Take 75 mg by mouth daily.   Yes Historical Provider, MD  cycloSPORINE (RESTASIS) 0.05 % ophthalmic emulsion Place 2 drops into both eyes 2 (two) times daily as needed (for dry eyes).    Yes Historical Provider, MD   diazepam (VALIUM) 10 MG tablet Take 0.5 tablets (5 mg total) by mouth every 8 (eight) hours as needed for anxiety. 07/16/13  Yes Allie Bossier, MD  docusate sodium 100 MG CAPS Take 100 mg by mouth 2 (two) times daily as needed for mild constipation. 08/04/13  Yes Hosie Poisson, MD  fluticasone (FLONASE) 50 MCG/ACT nasal spray Place 2 sprays into the nose every morning.    Yes Historical Provider, MD  furosemide (LASIX) 40 MG tablet Take 2 tablets (80 mg total) by mouth 2 (two) times daily. 10/09/13  Yes Rhonda G Barrett, PA-C  insulin detemir (LEVEMIR) 100 UNIT/ML injection Inject 35 Units into the skin at bedtime. Pt using lantus   Yes Historical Provider, MD  insulin lispro (HUMALOG) 100 UNIT/ML injection Inject 20-25 Units into the skin 3 (three) times daily before meals. Per sliding scale   Yes Historical Provider, MD  ipratropium-albuterol (DUONEB) 0.5-2.5 (3) MG/3ML SOLN Take 6 mLs by nebulization every 4 (four) hours as needed (wheezing).  06/29/13  Yes Dorie Rank, MD  lisinopril (PRINIVIL,ZESTRIL) 10 MG tablet Take 1 tablet (10 mg total) by mouth daily. 10/09/13  Yes Rhonda G Barrett, PA-C  nitroGLYCERIN (NITROSTAT) 0.4 MG SL tablet Place 1 tablet (0.4 mg total) under the tongue every 5 (five) minutes as needed for chest pain. 02/08/13  Yes Lorretta Harp, MD  omeprazole (PRILOSEC) 40 MG capsule Take 40 mg by mouth 2 (two) times daily.   Yes Historical Provider, MD  potassium chloride (K-DUR) 10 MEQ tablet Take 10 mEq by mouth daily.   Yes Historical Provider, MD  prasugrel (EFFIENT) 10 MG TABS tablet Take 1 tablet (10 mg total) by mouth daily. 10/09/13  Yes Rhonda G Barrett, PA-C  pravastatin (PRAVACHOL) 40 MG tablet Take 40 mg by mouth every evening.    Yes Historical Provider, MD  methylPREDNISolone sodium succinate (SOLU-MEDROL) 125 mg/2 mL injection Inject 125 mg into the vein once.    Historical Provider, MD  Paliperidone Palmitate (INVEGA SUSTENNA) 117 MG/0.75ML SUSP Inject 117 mg into the  muscle every 30 (thirty) days. One injection monthly.    Historical Provider, MD  predniSONE (DELTASONE) 20 MG tablet Take 3 tablets (60 mg total) by mouth daily. 12/26/13   Kalman Drape, MD   BP 124/69  Pulse 89  Temp(Src) 98.8 F (37.1 C)  Resp 30  Ht 5\' 8"  (1.727 m)  Wt 300 lb (136.079 kg)  BMI 45.63 kg/m2  SpO2 96%  LMP 05/30/2013 Physical Exam  Constitutional: She is oriented to person, place, and time. She appears well-developed and well-nourished. No distress.  HENT:  Head: Normocephalic and atraumatic.  Nose: Nose normal.  Mouth/Throat: Oropharynx is clear and moist.  Eyes: Conjunctivae and EOM are normal. Pupils are equal, round, and reactive to light.  Neck: Normal range of motion. Neck supple. No JVD present. No tracheal deviation present. No thyromegaly present.  Cardiovascular: Normal rate, regular rhythm, normal heart sounds and intact distal pulses.  Exam reveals no gallop and no friction rub.   No murmur heard. Pulmonary/Chest: Effort normal. No stridor. No respiratory distress. She has wheezes. She has no rales. She exhibits no tenderness.  Abdominal: Soft. Bowel sounds are normal. She exhibits no distension and no mass. There is no tenderness. There is no rebound and no guarding.  Musculoskeletal: Normal range of motion. She exhibits no edema and no tenderness.  Lymphadenopathy:    She has no cervical adenopathy.  Neurological: She is alert and oriented to person, place, and time. She exhibits normal muscle tone. Coordination normal.  Skin: Skin is warm and dry. No rash noted. No erythema. No pallor.  Psychiatric: She has a normal mood and affect. Her behavior is normal. Judgment and thought content normal.    ED Course  Procedures (including critical care time) Labs Review Labs Reviewed  CBC WITH DIFFERENTIAL - Abnormal; Notable for the following:    Hemoglobin 11.2 (*)    MCH 24.4 (*)    RDW 16.9 (*)    All other components within normal limits  BASIC  METABOLIC PANEL - Abnormal; Notable for the following:    Glucose, Bld 236 (*)    GFR calc non Af Amer 68 (*)    GFR calc Af Amer 78 (*)    All other components within normal limits  PRO B NATRIURETIC PEPTIDE - Abnormal; Notable for the following:    Pro B Natriuretic peptide (BNP) 284.2 (*)    All other components within normal limits  TROPONIN I    Imaging Review Dg Chest 2 View  12/26/2013   CLINICAL DATA:  Shortness of breath.  EXAM: CHEST  2 VIEW  COMPARISON:  10/08/2013  FINDINGS: Normal heart size and pulmonary vascularity. Linear scarring in the left upper lung and left base. Mild interstitial fibrosis. No focal airspace disease or consolidation. No blunting of costophrenic angles. No significant change since prior study.  IMPRESSION: No active cardiopulmonary disease.   Electronically Signed   By: Lucienne Capers M.D.   On: 12/26/2013 04:22     EKG Interpretation   Date/Time:  Wednesday December 26 2013 03:43:44 EDT Ventricular Rate:  84 PR Interval:  160 QRS Duration: 90 QT Interval:  395 QTC Calculation: 467 R Axis:   26 Text Interpretation:  Sinus rhythm Inferior infarct, old Anterior infarct,  old No significant change since last tracing Confirmed by Yilin Weedon  MD, Kendalynn Wideman  ([id number]) on 12/26/2013 3:56:22 AM      MDM   Final diagnoses:  COPD exacerbation    53 year old female with shortness of breath.  Wheezing on exam.  No signs of congestive heart failure exacerbation.  Suspect COPD exacerbation.  Patient was counseled on taking her medications as prescribed by her providers.  She will receive steroids for 5 days    Kalman Drape, MD 12/26/13 [phone]

## 2013-12-26 NOTE — ED Notes (Signed)
Patient transported to X-ray 

## 2013-12-28 ENCOUNTER — Encounter (HOSPITAL_COMMUNITY): Payer: Medicaid Other

## 2013-12-31 ENCOUNTER — Encounter (HOSPITAL_COMMUNITY): Payer: Medicaid Other

## 2014-01-02 ENCOUNTER — Encounter (HOSPITAL_COMMUNITY): Payer: Medicaid Other

## 2014-01-04 ENCOUNTER — Encounter (HOSPITAL_COMMUNITY): Payer: Medicaid Other

## 2014-01-04 ENCOUNTER — Telehealth (HOSPITAL_COMMUNITY): Payer: Self-pay | Admitting: Cardiac Rehabilitation

## 2014-01-04 NOTE — Telephone Encounter (Signed)
pc to pt to assess scheduling of PCP appt for clearance to return to cardiac rehab.  No answer.  Spoke to Dr. Mertie Moores office who offered pt appt for Aug 12 in [place] office or Aug 13 at [place] office.  Left message on both pt phones to return call to confirm appt that will be best for her. Per Dr. Mertie Moores pt will need to be seen in his office prior to returning to cardiac rehab following recent ED visit.

## 2014-01-07 ENCOUNTER — Encounter (HOSPITAL_COMMUNITY): Payer: Medicaid Other

## 2014-01-09 ENCOUNTER — Telehealth (HOSPITAL_COMMUNITY): Payer: Self-pay | Admitting: Cardiac Rehabilitation

## 2014-01-09 ENCOUNTER — Encounter (HOSPITAL_COMMUNITY): Payer: Medicaid Other

## 2014-01-09 NOTE — Telephone Encounter (Signed)
01/07/14 am and pm   pc to pt to assess ability to go to Dr. Mertie Moores office.  Lmvm for both phone numbers.  01/08/14 am pc to pt,  Pt states "I am at [employer], l will have to call you back".  Today phone call to pt no answer.  lmvm for both phone numbers.

## 2014-01-11 ENCOUNTER — Encounter (HOSPITAL_COMMUNITY): Payer: Medicaid Other

## 2014-01-13 ENCOUNTER — Telehealth: Payer: Self-pay | Admitting: Surgery

## 2014-01-13 NOTE — Telephone Encounter (Signed)
Message copied by Laurena Slimmer on Sun Jan 13, 2014  1:34 PM ------      Message from: Linton Flemings      Created: Wed Dec 26, 2013  5:48 AM      Regarding: Order for [facility]             Patient Name: [name]      Sex: Female      DOB: Nov 17, 1960              PCP: [place], [place]: [facility]             Types of orders made on 12/26/2013: Consult, ECG, Imaging, Lab, Medications            Order Date:12/26/2013      Ordering User:OTTER, OLGA [[id number]]      Attending Provider:Olga Eather Colas, MD [phone]      Authorizing Provider: Kalman Drape, MD [phone]      Department:[facility]PT[[id number]]            Order Specific Information      Order: COPD clinic follow up [Custom: [id number]  Order #: [id number]  Qty: 1        Priority: Routine  Class: Hospital Performed          Where does the patient receive follow up COPD care -> Other                 Follow up in -> 5-7 days               Released on: 12/26/2013  5:48 AM                    Priority: Routine  Class: Hospital Performed          Where does the patient receive follow up COPD care -> Other                 Follow up in -> 5-7 days               Released on: 12/26/2013  5:48 AM       ------

## 2014-01-14 ENCOUNTER — Encounter (HOSPITAL_COMMUNITY): Payer: Medicaid Other

## 2014-01-16 ENCOUNTER — Encounter (HOSPITAL_COMMUNITY): Payer: Medicaid Other

## 2014-01-18 ENCOUNTER — Encounter (HOSPITAL_COMMUNITY): Payer: Medicaid Other

## 2014-01-21 ENCOUNTER — Encounter (HOSPITAL_COMMUNITY): Payer: Medicaid Other

## 2014-01-21 ENCOUNTER — Emergency Department (HOSPITAL_COMMUNITY): Payer: Medicaid Other

## 2014-01-21 ENCOUNTER — Emergency Department (HOSPITAL_COMMUNITY)
Admission: EM | Admit: 2014-01-21 | Discharge: 2014-01-21 | Disposition: A | Discharge status: Home / Self Care | Payer: Medicaid Other | Attending: Emergency Medicine | Admitting: Emergency Medicine

## 2014-01-21 ENCOUNTER — Encounter (HOSPITAL_COMMUNITY): Payer: Self-pay | Admitting: Emergency Medicine

## 2014-01-21 DIAGNOSIS — Z87891 Personal history of nicotine dependence: Secondary | ICD-10-CM | POA: Insufficient documentation

## 2014-01-21 DIAGNOSIS — J449 Chronic obstructive pulmonary disease, unspecified: Secondary | ICD-10-CM | POA: Insufficient documentation

## 2014-01-21 DIAGNOSIS — Z794 Long term (current) use of insulin: Secondary | ICD-10-CM | POA: Diagnosis not present

## 2014-01-21 DIAGNOSIS — Z79899 Other long term (current) drug therapy: Secondary | ICD-10-CM | POA: Insufficient documentation

## 2014-01-21 DIAGNOSIS — E7800 Pure hypercholesterolemia, unspecified: Secondary | ICD-10-CM | POA: Diagnosis not present

## 2014-01-21 DIAGNOSIS — E119 Type 2 diabetes mellitus without complications: Secondary | ICD-10-CM | POA: Insufficient documentation

## 2014-01-21 DIAGNOSIS — IMO0002 Reserved for concepts with insufficient information to code with codable children: Secondary | ICD-10-CM | POA: Diagnosis not present

## 2014-01-21 DIAGNOSIS — K219 Gastro-esophageal reflux disease without esophagitis: Secondary | ICD-10-CM | POA: Diagnosis not present

## 2014-01-21 DIAGNOSIS — F319 Bipolar disorder, unspecified: Secondary | ICD-10-CM | POA: Insufficient documentation

## 2014-01-21 DIAGNOSIS — I2510 Atherosclerotic heart disease of native coronary artery without angina pectoris: Secondary | ICD-10-CM | POA: Insufficient documentation

## 2014-01-21 DIAGNOSIS — J4489 Other specified chronic obstructive pulmonary disease: Secondary | ICD-10-CM | POA: Insufficient documentation

## 2014-01-21 DIAGNOSIS — I509 Heart failure, unspecified: Secondary | ICD-10-CM | POA: Diagnosis not present

## 2014-01-21 DIAGNOSIS — D509 Iron deficiency anemia, unspecified: Secondary | ICD-10-CM | POA: Diagnosis not present

## 2014-01-21 DIAGNOSIS — Z7982 Long term (current) use of aspirin: Secondary | ICD-10-CM | POA: Insufficient documentation

## 2014-01-21 DIAGNOSIS — Z86711 Personal history of pulmonary embolism: Secondary | ICD-10-CM | POA: Insufficient documentation

## 2014-01-21 DIAGNOSIS — I10 Essential (primary) hypertension: Secondary | ICD-10-CM | POA: Insufficient documentation

## 2014-01-21 DIAGNOSIS — R0602 Shortness of breath: Secondary | ICD-10-CM | POA: Diagnosis present

## 2014-01-21 LAB — CBC WITH DIFFERENTIAL/PLATELET
BASOS ABS: 0 10*3/uL (ref 0.0–0.1)
BASOS PCT: 0 % (ref 0–1)
EOS PCT: 3 % (ref 0–5)
Eosinophils Absolute: 0.2 10*3/uL (ref 0.0–0.7)
HEMATOCRIT: 35.1 % — AB (ref 36.0–46.0)
HEMOGLOBIN: 10.8 g/dL — AB (ref 12.0–15.0)
LYMPHS PCT: 46 % (ref 12–46)
Lymphs Abs: 3.2 10*3/uL (ref 0.7–4.0)
MCH: 23.7 pg — ABNORMAL LOW (ref 26.0–34.0)
MCHC: 30.8 g/dL (ref 30.0–36.0)
MCV: 77.1 fL — AB (ref 78.0–100.0)
Monocytes Absolute: 0.7 10*3/uL (ref 0.1–1.0)
Monocytes Relative: 9 % (ref 3–12)
NEUTROS ABS: 3 10*3/uL (ref 1.7–7.7)
Neutrophils Relative %: 42 % — ABNORMAL LOW (ref 43–77)
Platelets: 311 10*3/uL (ref 150–400)
RBC: 4.55 MIL/uL (ref 3.87–5.11)
RDW: 17.5 % — AB (ref 11.5–15.5)
WBC: 7.1 10*3/uL (ref 4.0–10.5)

## 2014-01-21 LAB — BASIC METABOLIC PANEL
ANION GAP: 11 (ref 5–15)
BUN: 21 mg/dL (ref 6–23)
CALCIUM: 8.6 mg/dL (ref 8.4–10.5)
CO2: 30 mEq/L (ref 19–32)
CREATININE: 0.92 mg/dL (ref 0.50–1.10)
Chloride: 101 mEq/L (ref 96–112)
GFR calc non Af Amer: 69 mL/min — ABNORMAL LOW (ref >90)
GFR, EST AFRICAN AMERICAN: 80 mL/min — AB (ref >90)
Glucose, Bld: 148 mg/dL — ABNORMAL HIGH (ref 70–99)
Potassium: 4.2 mEq/L (ref 3.7–5.3)
Sodium: 142 mEq/L (ref 137–147)

## 2014-01-21 LAB — I-STAT TROPONIN, ED: Troponin i, poc: 0 ng/mL (ref 0.00–0.08)

## 2014-01-21 LAB — PRO B NATRIURETIC PEPTIDE: Pro B Natriuretic peptide (BNP): 171.3 pg/mL — ABNORMAL HIGH (ref 0–125)

## 2014-01-21 MED ORDER — FUROSEMIDE 10 MG/ML IJ SOLN
80.0000 mg | Freq: Once | INTRAMUSCULAR | Status: AC
  Administered 2014-01-21: 80 mg via INTRAVENOUS
  Filled 2014-01-21: qty 8

## 2014-01-21 NOTE — Discharge Instructions (Signed)
Double your morning dose of furosemide (Lasix) for the next three days. Stay on a low salt diet.  Heart Failure Heart failure is a condition in which the heart has trouble pumping blood. This means your heart does not pump blood efficiently for your body to work well. In some cases of heart failure, fluid may back up into your lungs or you may have swelling (edema) in your lower legs. Heart failure is usually a long-term (chronic) condition. It is important for you to take good care of yourself and follow your health care provider's treatment plan. CAUSES  Some health conditions can cause heart failure. Those health conditions include:  High blood pressure (hypertension). Hypertension causes the heart muscle to work harder than normal. When pressure in the blood vessels is high, the heart needs to pump (contract) with more force in order to circulate blood throughout the body. High blood pressure eventually causes the heart to become stiff and weak.  Coronary artery disease (CAD). CAD is the buildup of cholesterol and fat (plaque) in the arteries of the heart. The blockage in the arteries deprives the heart muscle of oxygen and blood. This can cause chest pain and may lead to a heart attack. High blood pressure can also contribute to CAD.  Heart attack (myocardial infarction). A heart attack occurs when one or more arteries in the heart become blocked. The loss of oxygen damages the muscle tissue of the heart. When this happens, part of the heart muscle dies. The injured tissue does not contract as well and weakens the heart's ability to pump blood.  Abnormal heart valves. When the heart valves do not open and close properly, it can cause heart failure. This makes the heart muscle pump harder to keep the blood flowing.  Heart muscle disease (cardiomyopathy or myocarditis). Heart muscle disease is damage to the heart muscle from a variety of causes. These can include drug or alcohol abuse, infections,  or unknown reasons. These can increase the risk of heart failure.  Lung disease. Lung disease makes the heart work harder because the lungs do not work properly. This can cause a strain on the heart, leading it to fail.  Diabetes. Diabetes increases the risk of heart failure. High blood sugar contributes to high fat (lipid) levels in the blood. Diabetes can also cause slow damage to tiny blood vessels that carry important nutrients to the heart muscle. When the heart does not get enough oxygen and food, it can cause the heart to become weak and stiff. This leads to a heart that does not contract efficiently.  Other conditions can contribute to heart failure. These include abnormal heart rhythms, thyroid problems, and low blood counts (anemia). Certain unhealthy behaviors can increase the risk of heart failure, including:  Being overweight.  Smoking or chewing tobacco.  Eating foods high in fat and cholesterol.  Abusing illicit drugs or alcohol.  Lacking physical activity. SYMPTOMS  Heart failure symptoms may vary and can be hard to detect. Symptoms may include:  Shortness of breath with activity, such as climbing stairs.  Persistent cough.  Swelling of the feet, ankles, legs, or abdomen.  Unexplained weight gain.  Difficulty breathing when lying flat (orthopnea).  Waking from sleep because of the need to sit up and get more air.  Rapid heartbeat.  Fatigue and loss of energy.  Feeling light-headed, dizzy, or close to fainting.  Loss of appetite.  Nausea.  Increased urination during the night (nocturia). DIAGNOSIS  A diagnosis of heart failure  is based on your history, symptoms, physical examination, and diagnostic tests. Diagnostic tests for heart failure may include:  Echocardiography.  Electrocardiography.  Chest X-ray.  Blood tests.  Exercise stress test.  Cardiac angiography.  Radionuclide scans. TREATMENT  Treatment is aimed at managing the symptoms of  heart failure. Medicines, behavioral changes, or surgical intervention may be necessary to treat heart failure.  Medicines to help treat heart failure may include:  Angiotensin-converting enzyme (ACE) inhibitors. This type of medicine blocks the effects of a blood protein called angiotensin-converting enzyme. ACE inhibitors relax (dilate) the blood vessels and help lower blood pressure.  Angiotensin receptor blockers (ARBs). This type of medicine blocks the actions of a blood protein called angiotensin. Angiotensin receptor blockers dilate the blood vessels and help lower blood pressure.  Water pills (diuretics). Diuretics cause the kidneys to remove salt and water from the blood. The extra fluid is removed through urination. This loss of extra fluid lowers the volume of blood the heart pumps.  Beta blockers. These prevent the heart from beating too fast and improve heart muscle strength.  Digitalis. This increases the force of the heartbeat.  Healthy behavior changes include:  Obtaining and maintaining a healthy weight.  Stopping smoking or chewing tobacco.  Eating heart-healthy foods.  Limiting or avoiding alcohol.  Stopping illicit drug use.  Physical activity as directed by your health care provider.  Surgical treatment for heart failure may include:  A procedure to open blocked arteries, repair damaged heart valves, or remove damaged heart muscle tissue.  A pacemaker to improve heart muscle function and control certain abnormal heart rhythms.  An internal cardioverter defibrillator to treat certain serious abnormal heart rhythms.  A left ventricular assist device (LVAD) to assist the pumping ability of the heart. HOME CARE INSTRUCTIONS   Take medicines only as directed by your health care provider. Medicines are important in reducing the workload of your heart, slowing the progression of heart failure, and improving your symptoms.  Do not stop taking your medicine unless  directed by your health care provider.  Do not skip any dose of medicine.  Refill your prescriptions before you run out of medicine. Your medicines are needed every day.  Engage in moderate physical activity if directed by your health care provider. Moderate physical activity can benefit some people. The elderly and people with severe heart failure should consult with a health care provider for physical activity recommendations.  Eat heart-healthy foods. Food choices should be free of trans fat and low in saturated fat, cholesterol, and salt (sodium). Healthy choices include fresh or frozen fruits and vegetables, fish, lean meats, legumes, fat-free or low-fat dairy products, and whole grain or high fiber foods. Talk to a dietitian to learn more about heart-healthy foods.  Limit sodium if directed by your health care provider. Sodium restriction may reduce symptoms of heart failure in some people. Talk to a dietitian to learn more about heart-healthy seasonings.  Use healthy cooking methods. Healthy cooking methods include roasting, grilling, broiling, baking, poaching, steaming, or stir-frying. Talk to a dietitian to learn more about healthy cooking methods.  Limit fluids if directed by your health care provider. Fluid restriction may reduce symptoms of heart failure in some people.  Weigh yourself every day. Daily weights are important in the early recognition of excess fluid. You should weigh yourself every morning after you urinate and before you eat breakfast. Wear the same amount of clothing each time you weigh yourself. Record your daily weight. Provide your health  care provider with your weight record.  Monitor and record your blood pressure if directed by your health care provider.  Check your pulse if directed by your health care provider.  Lose weight if directed by your health care provider. Weight loss may reduce symptoms of heart failure in some people.  Stop smoking or chewing  tobacco. Nicotine makes your heart work harder by causing your blood vessels to constrict. Do not use nicotine gum or patches before talking to your health care provider.  Keep all follow-up visits as directed by your health care provider. This is important.  Limit alcohol intake to no more than 1 drink per day for nonpregnant women and 2 drinks per day for men. One drink equals 12 ounces of beer, 5 ounces of wine, or 1 ounces of hard liquor. Drinking more than that is harmful to your heart. Tell your health care provider if you drink alcohol several times a week. Talk with your health care provider about whether alcohol is safe for you. If your heart has already been damaged by alcohol or you have severe heart failure, drinking alcohol should be stopped completely.  Stop illicit drug use.  Stay up-to-date with immunizations. It is especially important to prevent respiratory infections through current pneumococcal and influenza immunizations.  Manage other health conditions such as hypertension, diabetes, thyroid disease, or abnormal heart rhythms as directed by your health care provider.  Learn to manage stress.  Plan rest periods when fatigued.  Learn strategies to manage high temperatures. If the weather is extremely hot:  Avoid vigorous physical activity.  Use air conditioning or fans or seek a cooler location.  Avoid caffeine and alcohol.  Wear loose-fitting, lightweight, and light-colored clothing.  Learn strategies to manage cold temperatures. If the weather is extremely cold:  Avoid vigorous physical activity.  Layer clothes.  Wear mittens or gloves, a hat, and a scarf when going outside.  Avoid alcohol.  Obtain ongoing education and support as needed.  Participate in or seek rehabilitation as needed to maintain or improve independence and quality of life. SEEK MEDICAL CARE IF:   Your weight increases by 03 lb/1.4 kg in 1 day or 05 lb/2.3 kg in a week.  You have  increasing shortness of breath that is unusual for you.  You are unable to participate in your usual physical activities.  You tire easily.  You cough more than normal, especially with physical activity.  You have any or more swelling in areas such as your hands, feet, ankles, or abdomen.  You are unable to sleep because it is hard to breathe.  You feel like your heart is beating fast (palpitations).  You become dizzy or light-headed upon standing up. SEEK IMMEDIATE MEDICAL CARE IF:   You have difficulty breathing.  There is a change in mental status such as decreased alertness or difficulty with concentration.  You have a pain or discomfort in your chest.  You have an episode of fainting (syncope). MAKE SURE YOU:   Understand these instructions.  Will watch your condition.  Will get help right away if you are not doing well or get worse. Document Released: 05/17/2005 Document Revised: 10/01/2013 Document Reviewed: 06/16/2012 [facility] Patient Information 2015 [place], [place]. This information is not intended to replace advice given to you by your health care provider. Make sure you discuss any questions you have with your health care provider.

## 2014-01-21 NOTE — ED Notes (Signed)
IV start unsuccessful, will have another nurse try with ultrasound guidance.

## 2014-01-21 NOTE — ED Notes (Signed)
PT REFUSING LAB DRAWS UNTIL IV IS STARTED FOR LASIX.

## 2014-01-21 NOTE — ED Notes (Signed)
Pt reports swelling to BLE for 1 week; SOB over past few days. Denies CP. Reports has been taking Lasix. Pt is a x 4. Respiration smooth and unlabored.

## 2014-01-21 NOTE — ED Provider Notes (Signed)
CSN: [id number]     Arrival date & time 01/21/14  1523 History   First MD Initiated Contact with Patient 01/21/14 1651     Chief Complaint  Patient presents with  . Shortness of Breath     (Consider location/radiation/quality/duration/timing/severity/associated sxs/prior Treatment) Patient is a 53 y.o. female presenting with shortness of breath. The history is provided by the patient.  Shortness of Breath  She comes in with a one-week history of progressive leg swelling and dyspnea. Dyspnea is worse with walking and worse with laying flat. There is no chest pain, heaviness, tightness, pressure. She denies nausea, vomiting, diaphoresis. She denies fever or cough. She normally is supposed to take Lasix 80 mg in the morning and 40 mg in evening. She tried increasing her Lasix to 120 mg in the morning with no benefit. Past Medical History  Diagnosis Date  . Diabetes mellitus without complication   . PE (pulmonary embolism)   . Hypercholesterolemia   . Hypertension   . COPD (chronic obstructive pulmonary disease)   . Asthma   . Coronary artery disease   . CHF (congestive heart failure)   . Mental disorder     BIPOLAR  . Shortness of breath   . GERD (gastroesophageal reflux disease)   . Neuromuscular disorder     DIABETIC NEUROPATHY  . Ischemic cardiomyopathy   . Bipolar disorder   . Post-traumatic stress syndrome    Past Surgical History  Procedure Laterality Date  . Coronary angioplasty with stent placement      CAD in 2006 x 2 and 2009 2 more- place din REx in [place] and [place] med  . Esophagogastroduodenoscopy (egd) with propofol N/A 08/03/2013    Procedure: ESOPHAGOGASTRODUODENOSCOPY (EGD) WITH PROPOFOL;  Surgeon: Jeryl Columbia, MD;  Location: [facility];  Service: Endoscopy;  Laterality: N/A;  . Colonoscopy with propofol N/A 08/03/2013    Procedure: COLONOSCOPY WITH PROPOFOL;  Surgeon: Jeryl Columbia, MD;  Location: [facility];  Service: Endoscopy;  Laterality: N/A;  . Coronary  angioplasty with stent placement  10/07/2013    Xience Alpine DES 2.75  mm x 15  mm   Family History  Problem Relation Age of Onset  . Stroke Mother   . Cancer Father    History  Substance Use Topics  . Smoking status: Former Smoker    Types: Cigarettes    Quit date: 11/24/2012  . Smokeless tobacco: Never Used  . Alcohol Use: No   OB History   Grav Para Term Preterm Abortions TAB SAB Ect Mult Living                 Review of Systems  Respiratory: Positive for shortness of breath.   All other systems reviewed and are negative.     Allergies  Metolazone; Plavix; and Nsaids  Home Medications   Prior to Admission medications   Medication Sig Start Date End Date Taking? Authorizing Provider  acetaminophen (TYLENOL) 500 MG tablet Take 1,000 mg by mouth every 6 (six) hours as needed for mild pain or moderate pain (Lower back pain).    Yes Historical Provider, MD  albuterol (PROVENTIL HFA;VENTOLIN HFA) 108 (90 BASE) MCG/ACT inhaler Inhale 2 puffs into the lungs every 6 (six) hours as needed for wheezing or shortness of breath.  06/29/13  Yes Dorie Rank, MD  albuterol (PROVENTIL) (2.5 MG/3ML) 0.083% nebulizer solution Take 6 mLs (5 mg total) by nebulization every 4 (four) hours as needed for wheezing or shortness of breath. 06/24/13  Yes  Jennifer L Piepenbrink, PA-C  aspirin 81 MG tablet Take 1 tablet (81 mg total) by mouth daily. 10/09/13  Yes Rhonda G Barrett, PA-C  carvedilol (COREG) 25 MG tablet Take 12.5 mg by mouth 2 (two) times daily with a meal.    Yes Historical Provider, MD  cycloSPORINE (RESTASIS) 0.05 % ophthalmic emulsion Place 2 drops into both eyes 2 (two) times daily as needed (for dry eyes).    Yes Historical Provider, MD  diazepam (VALIUM) 10 MG tablet Take 0.5 tablets (5 mg total) by mouth every 8 (eight) hours as needed for anxiety. 07/16/13  Yes Allie Bossier, MD  fluticasone [facility]) 50 MCG/ACT nasal spray Place 2 sprays into the nose 2 (two) times daily.    Yes  Historical Provider, MD  furosemide (LASIX) 40 MG tablet Take 2 tablets (80 mg total) by mouth 2 (two) times daily. 10/09/13  Yes Rhonda G Barrett, PA-C  insulin aspart (NOVOLOG) 100 UNIT/ML injection Inject 15-20 Units into the skin 3 (three) times daily before meals. Sliding scale   Yes Historical Provider, MD  lisinopril (PRINIVIL,ZESTRIL) 10 MG tablet Take 1 tablet (10 mg total) by mouth daily. 10/09/13  Yes Rhonda G Barrett, PA-C  omeprazole (PRILOSEC) 40 MG capsule Take 40 mg by mouth 2 (two) times daily.   Yes Historical Provider, MD  Paliperidone Palmitate (INVEGA SUSTENNA) 117 MG/0.75ML SUSP Inject 117 mg into the muscle every 30 (thirty) days. One injection monthly.   Yes Historical Provider, MD  potassium chloride (K-DUR) 10 MEQ tablet Take 10 mEq by mouth 2 (two) times daily.    Yes Historical Provider, MD  prasugrel (EFFIENT) 10 MG TABS tablet Take 1 tablet (10 mg total) by mouth daily. 10/09/13  Yes Rhonda G Barrett, PA-C  pravastatin (PRAVACHOL) 40 MG tablet Take 40 mg by mouth every evening.    Yes Historical Provider, MD  insulin detemir (LEVEMIR) 100 UNIT/ML injection Inject 35 Units into the skin at bedtime.     Historical Provider, MD  nitroGLYCERIN (NITROSTAT) 0.4 MG SL tablet Place 1 tablet (0.4 mg total) under the tongue every 5 (five) minutes as needed for chest pain. 02/08/13   Lorretta Harp, MD   BP 107/55  Pulse 87  Temp(Src) 98.1 F (36.7 C) (Oral)  Resp 25  Ht 5\' 7"  (1.702 m)  Wt 303 lb (137.44 kg)  BMI 47.45 kg/m2  SpO2 96%  LMP 05/30/2013 Physical Exam  Nursing note and vitals reviewed.  53 year old female, resting comfortably and in no acute distress. Vital signs are significant for tachypnea. Oxygen saturation is 96%, which is normal. Head is normocephalic and atraumatic. PERRLA, EOMI. Oropharynx is clear. Neck is nontender and supple without adenopathy or JVD. Back is nontender and there is no CVA tenderness. Lungs are clear without rales, wheezes, or  rhonchi. Chest is nontender. Heart has regular rate and rhythm without murmur. Abdomen is soft, flat, nontender without masses or hepatosplenomegaly and peristalsis is normoactive. Extremities have 2+ edema, full range of motion is present. Skin is warm and dry without rash. Neurologic: Mental status is normal, cranial nerves are intact, there are no motor or sensory deficits.  ED Course  Procedures (including critical care time) Labs Review Results for orders placed during the hospital encounter of 0000000  BASIC METABOLIC PANEL      Result Value Ref Range   Sodium 142  137 - 147 mEq/L   Potassium 4.2  3.7 - 5.3 mEq/L   Chloride 101  96 -  112 mEq/L   CO2 30  19 - 32 mEq/L   Glucose, Bld 148 (*) 70 - 99 mg/dL   BUN 21  6 - 23 mg/dL   Creatinine, Ser 0.92  0.50 - 1.10 mg/dL   Calcium 8.6  8.4 - 10.5 mg/dL   GFR calc non Af Amer 69 (*) >90 mL/min   GFR calc Af Amer 80 (*) >90 mL/min   Anion gap 11  5 - 15  PRO B NATRIURETIC PEPTIDE      Result Value Ref Range   Pro B Natriuretic peptide (BNP) 171.3 (*) 0 - 125 pg/mL  CBC WITH DIFFERENTIAL      Result Value Ref Range   WBC 7.1  4.0 - 10.5 K/uL   RBC 4.55  3.87 - 5.11 MIL/uL   Hemoglobin 10.8 (*) 12.0 - 15.0 g/dL   HCT 35.1 (*) 36.0 - 46.0 %   MCV 77.1 (*) 78.0 - 100.0 fL   MCH 23.7 (*) 26.0 - 34.0 pg   MCHC 30.8  30.0 - 36.0 g/dL   RDW 17.5 (*) 11.5 - 15.5 %   Platelets 311  150 - 400 K/uL   Neutrophils Relative % 42 (*) 43 - 77 %   Neutro Abs 3.0  1.7 - 7.7 K/uL   Lymphocytes Relative 46  12 - 46 %   Lymphs Abs 3.2  0.7 - 4.0 K/uL   Monocytes Relative 9  3 - 12 %   Monocytes Absolute 0.7  0.1 - 1.0 K/uL   Eosinophils Relative 3  0 - 5 %   Eosinophils Absolute 0.2  0.0 - 0.7 K/uL   Basophils Relative 0  0 - 1 %   Basophils Absolute 0.0  0.0 - 0.1 K/uL  I-STAT TROPOININ, ED      Result Value Ref Range   Troponin i, poc 0.00  0.00 - 0.08 ng/mL   Comment 3            Imaging Review Dg Chest 2 View  01/21/2014    CLINICAL DATA:  Short of breath for 2 days. History of hypertension. COPD. Asthma. Congestive heart failure. Ex-smoker.  EXAM: CHEST  2 VIEW  COMPARISON:  12/26/2013  FINDINGS: Midline trachea. Borderline cardiomegaly. No pleural effusion or pneumothorax. Chronic interstitial thickening. Areas of left upper lobe and left lower lobe scarring are not significantly changed.  IMPRESSION: No acute cardiopulmonary disease.  Borderline cardiomegaly, without congestive failure.  Peribronchial thickening which may relate to chronic bronchitis or smoking.   Electronically Signed   By: Abigail Miyamoto M.D.   On: 01/21/2014 16:12     EKG Interpretation   Date/Time:  Monday January 21 2014 15:31:47 EDT Ventricular Rate:  89 PR Interval:  158 QRS Duration: 96 QT Interval:  364 QTC Calculation: 442 R Axis:   92 Text Interpretation:  Sinus rhythm with occasional and consecutive  Premature ventricular complexes Rightward axis Cannot rule out Inferior  infarct , age undetermined Abnormal ECG When compared with ECG of  12/26/2013, Premature ventricular complexes are now Present Confirmed by  [facility]  MD, Temeka Pore ([id number]) on 01/21/2014 5:03:06 PM      MDM   Final diagnoses:  CHF exacerbation  Microcytic anemia    Leg swelling and dyspnea consistent with congestive heart failure. ECG shows no acute changes. She'll be given a dose of furosemide. Chest x-ray shows cardiomegaly without definite pulmonary vascular congestion. BNP is pending.  BNP is coming back only mildly elevated. She had good diuresis  with intravenous furosemide and is feeling much better. She was discharged with instructions to increase her furosemide dose for the next 3 days and then follow up with PCP.  Delora Fuel, MD [phone]

## 2014-01-23 ENCOUNTER — Encounter (HOSPITAL_COMMUNITY): Payer: Medicaid Other

## 2014-01-24 ENCOUNTER — Emergency Department (HOSPITAL_COMMUNITY)
Admission: EM | Admit: 2014-01-24 | Discharge: 2014-01-24 | Disposition: A | Discharge status: Home / Self Care | Payer: Medicaid Other | Attending: Emergency Medicine | Admitting: Emergency Medicine

## 2014-01-24 ENCOUNTER — Encounter (HOSPITAL_COMMUNITY): Payer: Self-pay | Admitting: Emergency Medicine

## 2014-01-24 DIAGNOSIS — E119 Type 2 diabetes mellitus without complications: Secondary | ICD-10-CM | POA: Insufficient documentation

## 2014-01-24 DIAGNOSIS — J441 Chronic obstructive pulmonary disease with (acute) exacerbation: Secondary | ICD-10-CM | POA: Diagnosis not present

## 2014-01-24 DIAGNOSIS — Z794 Long term (current) use of insulin: Secondary | ICD-10-CM | POA: Diagnosis not present

## 2014-01-24 DIAGNOSIS — Z8669 Personal history of other diseases of the nervous system and sense organs: Secondary | ICD-10-CM | POA: Insufficient documentation

## 2014-01-24 DIAGNOSIS — K219 Gastro-esophageal reflux disease without esophagitis: Secondary | ICD-10-CM | POA: Insufficient documentation

## 2014-01-24 DIAGNOSIS — I509 Heart failure, unspecified: Secondary | ICD-10-CM | POA: Diagnosis not present

## 2014-01-24 DIAGNOSIS — E7800 Pure hypercholesterolemia, unspecified: Secondary | ICD-10-CM | POA: Diagnosis not present

## 2014-01-24 DIAGNOSIS — Z8659 Personal history of other mental and behavioral disorders: Secondary | ICD-10-CM | POA: Diagnosis not present

## 2014-01-24 DIAGNOSIS — I2510 Atherosclerotic heart disease of native coronary artery without angina pectoris: Secondary | ICD-10-CM | POA: Diagnosis not present

## 2014-01-24 DIAGNOSIS — Z7982 Long term (current) use of aspirin: Secondary | ICD-10-CM | POA: Diagnosis not present

## 2014-01-24 DIAGNOSIS — R609 Edema, unspecified: Secondary | ICD-10-CM | POA: Diagnosis not present

## 2014-01-24 DIAGNOSIS — M7989 Other specified soft tissue disorders: Secondary | ICD-10-CM | POA: Insufficient documentation

## 2014-01-24 DIAGNOSIS — Z87891 Personal history of nicotine dependence: Secondary | ICD-10-CM | POA: Insufficient documentation

## 2014-01-24 DIAGNOSIS — Z79899 Other long term (current) drug therapy: Secondary | ICD-10-CM | POA: Diagnosis not present

## 2014-01-24 DIAGNOSIS — Z9861 Coronary angioplasty status: Secondary | ICD-10-CM | POA: Diagnosis not present

## 2014-01-24 DIAGNOSIS — J45901 Unspecified asthma with (acute) exacerbation: Secondary | ICD-10-CM | POA: Diagnosis not present

## 2014-01-24 DIAGNOSIS — I10 Essential (primary) hypertension: Secondary | ICD-10-CM | POA: Diagnosis not present

## 2014-01-24 LAB — I-STAT CHEM 8, ED
BUN: 16 mg/dL (ref 6–23)
CHLORIDE: 97 meq/L (ref 96–112)
Calcium, Ion: 1.12 mmol/L (ref 1.12–1.23)
Creatinine, Ser: 1 mg/dL (ref 0.50–1.10)
GLUCOSE: 234 mg/dL — AB (ref 70–99)
HCT: 42 % (ref 36.0–46.0)
Hemoglobin: 14.3 g/dL (ref 12.0–15.0)
Potassium: 3.4 mEq/L — ABNORMAL LOW (ref 3.7–5.3)
Sodium: 142 mEq/L (ref 137–147)
TCO2: 30 mmol/L (ref 0–100)

## 2014-01-24 MED ORDER — ONETOUCH ULTRA SYSTEM W/DEVICE KIT: 1.0000 | PACK | Freq: Once | Status: DC

## 2014-01-24 MED ORDER — ACCU-CHEK ADVANTAGE DIABETES KIT: PACK | Status: DC

## 2014-01-24 MED ORDER — IPRATROPIUM BROMIDE 0.02 % IN SOLN
0.5000 mg | Freq: Once | RESPIRATORY_TRACT | Status: AC
  Administered 2014-01-24: 0.5 mg via RESPIRATORY_TRACT
  Filled 2014-01-24: qty 2.5

## 2014-01-24 MED ORDER — ALBUTEROL SULFATE (2.5 MG/3ML) 0.083% IN NEBU
5.0000 mg | INHALATION_SOLUTION | Freq: Once | RESPIRATORY_TRACT | Status: AC
  Administered 2014-01-24: 5 mg via RESPIRATORY_TRACT
  Filled 2014-01-24: qty 6

## 2014-01-24 MED ORDER — FUROSEMIDE 10 MG/ML IJ SOLN
80.0000 mg | Freq: Once | INTRAMUSCULAR | Status: AC
  Administered 2014-01-24: 80 mg via INTRAVENOUS
  Filled 2014-01-24: qty 8

## 2014-01-24 NOTE — Discharge Instructions (Signed)
Please continue with your Lasix and follow up closely with your doctor for further management of your leg swelling.    Peripheral Edema You have swelling in your legs (peripheral edema). This swelling is due to excess accumulation of salt and water in your body. Edema may be a sign of heart, kidney or liver disease, or a side effect of a medication. It may also be due to problems in the leg veins. Elevating your legs and using special support stockings may be very helpful, if the cause of the swelling is due to poor venous circulation. Avoid long periods of standing, whatever the cause. Treatment of edema depends on identifying the cause. Chips, pretzels, pickles and other salty foods should be avoided. Restricting salt in your diet is almost always needed. Water pills (diuretics) are often used to remove the excess salt and water from your body via urine. These medicines prevent the kidney from reabsorbing sodium. This increases urine flow. Diuretic treatment may also result in lowering of potassium levels in your body. Potassium supplements may be needed if you have to use diuretics daily. Daily weights can help you keep track of your progress in clearing your edema. You should call your caregiver for follow up care as recommended. SEEK IMMEDIATE MEDICAL CARE IF:   You have increased swelling, pain, redness, or heat in your legs.  You develop shortness of breath, especially when lying down.  You develop chest or abdominal pain, weakness, or fainting.  You have a fever. Document Released: 06/24/2004 Document Revised: 08/09/2011 Document Reviewed: 06/04/2009 [facility] Patient Information 2015 [place], [place]. This information is not intended to replace advice given to you by your health care provider. Make sure you discuss any questions you have with your health care provider.

## 2014-01-24 NOTE — ED Notes (Signed)
Bedside commode provided to patient.

## 2014-01-24 NOTE — ED Provider Notes (Signed)
Medical screening examination/treatment/procedure(s) were conducted as a shared visit with non-physician practitioner(s) or resident and myself. I personally evaluated the patient during the encounter and agree with the findings.  I have personally reviewed any xrays and/ or EKG's with the provider and I agree with interpretation.  Patient with heart failure history and diabetes presents with worsening leg swelling and weight gain of approximately 15 pounds. Patient recently was seen and doubled her morning Lasix dose 260 mg a day for 2-3 days however no significant improvement. Very mild shortness of breath only with lying flat. Patient denies fevers or chills. On exam patient obese, 2+ pitting edema lower chimney is bilateral, lungs clear to auscultation however due to body habitus difficulty appreciating details, regular heart rate, well-appearing no distress, not requiring oxygen. Patient has close outpatient followup, plan for IV Lasix dose and rechecking kidney function with close outpatient follow.  CHF history, leg edema bilateral   Mariea Clonts, MD 01/24/14 [phone]

## 2014-01-24 NOTE — ED Notes (Signed)
MD Zavitz at bedside  

## 2014-01-24 NOTE — ED Notes (Signed)
Pt reports hx of CHF and DM, pt seen in ED on 8/24 but reports she is still "swollen". Reports she has followed the recommendations of the lasix. Reports foot pain 5/10 and that they "feel numb" when she walks. Denies chest pain, denies SOB. Pt wants ED to help her get the fluid off of her.

## 2014-01-24 NOTE — ED Notes (Signed)
Pt ambulating to another patient room in which she arrived here with.

## 2014-01-24 NOTE — ED Notes (Addendum)
Will have Charge RN to attempt IV insertion and lab draw.

## 2014-01-24 NOTE — ED Provider Notes (Signed)
CSN: [id number]     Arrival date & time 01/24/14  1026 History   First MD Initiated Contact with Patient 01/24/14 1051     Chief Complaint  Patient presents with  . Leg Swelling     (Consider location/radiation/quality/duration/timing/severity/associated sxs/prior Treatment) HPI  53 year old female who history of bipolar, CHF, COPD, CAD, prior PE currently on Effient presents for evaluation of leg swelling. Patient reports for nearly 2 weeks she has noticed increased bilateral lower leg swelling. Complaining of pressure around the ankle and foot due to the swelling. Symptoms got progressively worse and now she experiencing tingling sensation to her lower extremities. She also endorsed mild shortness of breath without any significant cough, hemoptysis, fever chills or chest pain. Pt was taken 80mg  of Lasix twice daily.  Pt was seen in ER 3 days ago for the same complaint.  BNP at that time was minimally elevated at 171.  CXR without overt edema.  She was recommended to increase her Lasix dosing to 160 mg in the morning and 80 mg at night. She has been taken to increase Lasix dose for the past 3 days but noticed no improvement. She report watching her diet carefully and avoid food with high sodium. She was seen by her primary care Dr. last week but did not discussed about her leg swelling. She is here requesting for additional fluid to be removed.  Past Medical History  Diagnosis Date  . Diabetes mellitus without complication   . PE (pulmonary embolism)   . Hypercholesterolemia   . Hypertension   . COPD (chronic obstructive pulmonary disease)   . Asthma   . Coronary artery disease   . CHF (congestive heart failure)   . Mental disorder     BIPOLAR  . Shortness of breath   . GERD (gastroesophageal reflux disease)   . Neuromuscular disorder     DIABETIC NEUROPATHY  . Ischemic cardiomyopathy   . Bipolar disorder   . Post-traumatic stress syndrome    Past Surgical History  Procedure  Laterality Date  . Coronary angioplasty with stent placement      CAD in 2006 x 2 and 2009 2 more- place din REx in [place] and [place] med  . Esophagogastroduodenoscopy (egd) with propofol N/A 08/03/2013    Procedure: ESOPHAGOGASTRODUODENOSCOPY (EGD) WITH PROPOFOL;  Surgeon: Jeryl Columbia, MD;  Location: [facility];  Service: Endoscopy;  Laterality: N/A;  . Colonoscopy with propofol N/A 08/03/2013    Procedure: COLONOSCOPY WITH PROPOFOL;  Surgeon: Jeryl Columbia, MD;  Location: [facility];  Service: Endoscopy;  Laterality: N/A;  . Coronary angioplasty with stent placement  10/07/2013    Xience Alpine DES 2.75  mm x 15  mm   Family History  Problem Relation Age of Onset  . Stroke Mother   . Cancer Father    History  Substance Use Topics  . Smoking status: Former Smoker    Types: Cigarettes    Quit date: 11/24/2012  . Smokeless tobacco: Never Used  . Alcohol Use: No   OB History   Grav Para Term Preterm Abortions TAB SAB Ect Mult Living                 Review of Systems  All other systems reviewed and are negative.     Allergies  Metolazone; Plavix; and Nsaids  Home Medications   Prior to Admission medications   Medication Sig Start Date End Date Taking? Authorizing Provider  acetaminophen (TYLENOL) 500 MG tablet Take 1,000 mg by  mouth every 6 (six) hours as needed for mild pain or moderate pain (Lower back pain).     Historical Provider, MD  albuterol (PROVENTIL HFA;VENTOLIN HFA) 108 (90 BASE) MCG/ACT inhaler Inhale 2 puffs into the lungs every 6 (six) hours as needed for wheezing or shortness of breath.  06/29/13   Dorie Rank, MD  albuterol (PROVENTIL) (2.5 MG/3ML) 0.083% nebulizer solution Take 6 mLs (5 mg total) by nebulization every 4 (four) hours as needed for wheezing or shortness of breath. 06/24/13   Stephani Police Piepenbrink, PA-C  aspirin 81 MG tablet Take 1 tablet (81 mg total) by mouth daily. 10/09/13   Rhonda G Barrett, PA-C  carvedilol (COREG) 25 MG tablet Take 12.5 mg  by mouth 2 (two) times daily with a meal.     Historical Provider, MD  cycloSPORINE (RESTASIS) 0.05 % ophthalmic emulsion Place 2 drops into both eyes 2 (two) times daily as needed (for dry eyes).     Historical Provider, MD  diazepam (VALIUM) 10 MG tablet Take 0.5 tablets (5 mg total) by mouth every 8 (eight) hours as needed for anxiety. 07/16/13   Allie Bossier, MD  fluticasone (FLONASE) 50 MCG/ACT nasal spray Place 2 sprays into the nose 2 (two) times daily.     Historical Provider, MD  furosemide (LASIX) 40 MG tablet Take 2 tablets (80 mg total) by mouth 2 (two) times daily. 10/09/13   Rhonda G Barrett, PA-C  insulin aspart (NOVOLOG) 100 UNIT/ML injection Inject 15-20 Units into the skin 3 (three) times daily before meals. Sliding scale    Historical Provider, MD  insulin detemir (LEVEMIR) 100 UNIT/ML injection Inject 35 Units into the skin at bedtime.     Historical Provider, MD  lisinopril (PRINIVIL,ZESTRIL) 10 MG tablet Take 1 tablet (10 mg total) by mouth daily. 10/09/13   Rhonda G Barrett, PA-C  nitroGLYCERIN (NITROSTAT) 0.4 MG SL tablet Place 1 tablet (0.4 mg total) under the tongue every 5 (five) minutes as needed for chest pain. 02/08/13   Lorretta Harp, MD  omeprazole (PRILOSEC) 40 MG capsule Take 40 mg by mouth 2 (two) times daily.    Historical Provider, MD  Paliperidone Palmitate (INVEGA SUSTENNA) 117 MG/0.75ML SUSP Inject 117 mg into the muscle every 30 (thirty) days. One injection monthly.    Historical Provider, MD  potassium chloride (K-DUR) 10 MEQ tablet Take 10 mEq by mouth 2 (two) times daily.     Historical Provider, MD  prasugrel (EFFIENT) 10 MG TABS tablet Take 1 tablet (10 mg total) by mouth daily. 10/09/13   Rhonda G Barrett, PA-C  pravastatin (PRAVACHOL) 40 MG tablet Take 40 mg by mouth every evening.     Historical Provider, MD   BP 128/65  Pulse 81  Temp(Src) 98.3 F (36.8 C) (Oral)  Resp 16  SpO2 95%  LMP 05/30/2013 Physical Exam  Nursing note and vitals  reviewed. Constitutional: She is oriented to person, place, and time. She appears well-developed and well-nourished. No distress.  HENT:  Head: Atraumatic.  Eyes: Conjunctivae are normal.  Neck: Neck supple. No JVD present.  Cardiovascular: Normal rate, regular rhythm and intact distal pulses.  Exam reveals no gallop and no friction rub.   No murmur heard. Pulmonary/Chest: Effort normal. She has wheezes (Faint wheezes heard). She has rales (faint rales heard).  Musculoskeletal: She exhibits edema (1+ pitting edema to bilateral lower extremities without palpable cord, erythema, negative Homans sign.).  Neurological: She is alert and oriented to person, place, and time.  Skin: No rash noted.  Psychiatric: She has a normal mood and affect.    ED Course  Procedures (including critical care time)  11:19 AM Pt with hx of CHF NYHA class 2; EF 30-35% here with progressive peripheral edema not improved with double her Lasix dose for the past 3 days.  Her renal function was normal, cxr unremarkable, and last BNP 3 days ago was minimally elevated at 171.  Therefore, plan to provide Lasix IV 80mg , and will recheck renal function.  I do not think repeat CXR is necessary.  Pt in no acute resp distress, low suspicion for PNA.  Doubt DVT, pt currently on anticoagulants.  Care discussed with Dr. Reather Converse.  12:50 PM Patient has normal renal function, elevated CBG of 234. She has recently received IV Lasix and is able to make urine.  Plan to have pt f/u closely with her PCP for further management of her CHF.  Pt also request for a glucose meter (One Touch) because her meter no longer works since 2 days ago.  Will prescribe as request.  Since pt has minimal wheezing and request breathing treatment, will provide albuterol/atrovent nebs.  Pt uses supplemental O2 at home.     Labs Review Labs Reviewed  I-STAT CHEM 8, ED - Abnormal; Notable for the following:    Potassium 3.4 (*)    Glucose, Bld 234 (*)    All  other components within normal limits    Imaging Review No results found.   EKG Interpretation None      MDM   Final diagnoses:  Edema, peripheral   BP 128/65  Pulse 81  Temp(Src) 98.3 F (36.8 C) (Oral)  Resp 16  SpO2 95%  LMP 05/30/2013     Domenic Moras, PA-C 01/24/14 1252  Domenic Moras, PA-C 01/24/14 1327

## 2014-01-24 NOTE — ED Notes (Signed)
Bilateral lower extremity edema that she reports has gotten worse. She was given 40mg  of Lasix and she reports that she has continued that regimen.

## 2014-01-24 NOTE — ED Notes (Signed)
Pt given diet ginger ale @ 12:50

## 2014-01-24 NOTE — ED Notes (Signed)
Pt ambulated to restroom with steady gait.

## 2014-01-25 ENCOUNTER — Telehealth (HOSPITAL_COMMUNITY): Payer: Self-pay | Admitting: Cardiac Rehabilitation

## 2014-01-25 ENCOUNTER — Encounter (HOSPITAL_COMMUNITY): Payer: Medicaid Other

## 2014-01-25 NOTE — Telephone Encounter (Signed)
pc received from pt reporting 2 ED visits this week. Pt states she cancelled her Dr. Einar Gip appt on 01/24/14 because she did not have the $3 copay.  Tried to call Dr. Einar Gip to rescedule appt for pt however office closed. Pt instructed to call Dr Einar Gip office Monday to schedule appt to be cleared to return to cardiac rehab. Pt also states she is unsatisfied with her PCP and will look for new one. Pt also states she has attempted to get glucose strips refilled with her PCP.  Pt verbalized understanding.

## 2014-01-28 ENCOUNTER — Encounter (HOSPITAL_COMMUNITY): Payer: Medicaid Other

## 2014-01-30 ENCOUNTER — Encounter (HOSPITAL_COMMUNITY): Payer: Medicaid Other

## 2014-02-01 ENCOUNTER — Emergency Department (HOSPITAL_COMMUNITY): Payer: Medicaid Other

## 2014-02-01 ENCOUNTER — Emergency Department (HOSPITAL_COMMUNITY)
Admission: EM | Admit: 2014-02-01 | Discharge: 2014-02-01 | Disposition: A | Discharge status: Home / Self Care | Payer: Medicaid Other | Attending: Emergency Medicine | Admitting: Emergency Medicine

## 2014-02-01 ENCOUNTER — Encounter (HOSPITAL_COMMUNITY): Payer: Self-pay | Admitting: Emergency Medicine

## 2014-02-01 ENCOUNTER — Encounter (HOSPITAL_COMMUNITY): Payer: Medicaid Other

## 2014-02-01 DIAGNOSIS — Z9861 Coronary angioplasty status: Secondary | ICD-10-CM | POA: Insufficient documentation

## 2014-02-01 DIAGNOSIS — I2510 Atherosclerotic heart disease of native coronary artery without angina pectoris: Secondary | ICD-10-CM | POA: Insufficient documentation

## 2014-02-01 DIAGNOSIS — Z794 Long term (current) use of insulin: Secondary | ICD-10-CM | POA: Insufficient documentation

## 2014-02-01 DIAGNOSIS — IMO0002 Reserved for concepts with insufficient information to code with codable children: Secondary | ICD-10-CM | POA: Diagnosis not present

## 2014-02-01 DIAGNOSIS — E1149 Type 2 diabetes mellitus with other diabetic neurological complication: Secondary | ICD-10-CM | POA: Diagnosis not present

## 2014-02-01 DIAGNOSIS — E1142 Type 2 diabetes mellitus with diabetic polyneuropathy: Secondary | ICD-10-CM | POA: Insufficient documentation

## 2014-02-01 DIAGNOSIS — Z7982 Long term (current) use of aspirin: Secondary | ICD-10-CM | POA: Insufficient documentation

## 2014-02-01 DIAGNOSIS — M7989 Other specified soft tissue disorders: Secondary | ICD-10-CM | POA: Insufficient documentation

## 2014-02-01 DIAGNOSIS — Z86711 Personal history of pulmonary embolism: Secondary | ICD-10-CM | POA: Diagnosis not present

## 2014-02-01 DIAGNOSIS — Z79899 Other long term (current) drug therapy: Secondary | ICD-10-CM | POA: Diagnosis not present

## 2014-02-01 DIAGNOSIS — E7800 Pure hypercholesterolemia, unspecified: Secondary | ICD-10-CM | POA: Insufficient documentation

## 2014-02-01 DIAGNOSIS — I509 Heart failure, unspecified: Secondary | ICD-10-CM | POA: Diagnosis not present

## 2014-02-01 DIAGNOSIS — Z87891 Personal history of nicotine dependence: Secondary | ICD-10-CM | POA: Insufficient documentation

## 2014-02-01 DIAGNOSIS — I10 Essential (primary) hypertension: Secondary | ICD-10-CM | POA: Diagnosis not present

## 2014-02-01 DIAGNOSIS — Z8659 Personal history of other mental and behavioral disorders: Secondary | ICD-10-CM | POA: Diagnosis not present

## 2014-02-01 DIAGNOSIS — J441 Chronic obstructive pulmonary disease with (acute) exacerbation: Secondary | ICD-10-CM

## 2014-02-01 DIAGNOSIS — K219 Gastro-esophageal reflux disease without esophagitis: Secondary | ICD-10-CM

## 2014-02-01 LAB — COMPREHENSIVE METABOLIC PANEL
ALT: 13 U/L (ref 0–35)
AST: 16 U/L (ref 0–37)
Albumin: 3.2 g/dL — ABNORMAL LOW (ref 3.5–5.2)
Alkaline Phosphatase: 78 U/L (ref 39–117)
Anion gap: 10 (ref 5–15)
BUN: 12 mg/dL (ref 6–23)
CALCIUM: 8.8 mg/dL (ref 8.4–10.5)
CO2: 32 mEq/L (ref 19–32)
Chloride: 99 mEq/L (ref 96–112)
Creatinine, Ser: 0.9 mg/dL (ref 0.50–1.10)
GFR calc non Af Amer: 71 mL/min — ABNORMAL LOW (ref >90)
GFR, EST AFRICAN AMERICAN: 83 mL/min — AB (ref >90)
GLUCOSE: 69 mg/dL — AB (ref 70–99)
Potassium: 3.6 mEq/L — ABNORMAL LOW (ref 3.7–5.3)
SODIUM: 141 meq/L (ref 137–147)
TOTAL PROTEIN: 8 g/dL (ref 6.0–8.3)
Total Bilirubin: <0.2 mg/dL — ABNORMAL LOW (ref 0.3–1.2)

## 2014-02-01 LAB — I-STAT TROPONIN, ED: Troponin i, poc: 0 ng/mL (ref 0.00–0.08)

## 2014-02-01 LAB — CBC WITH DIFFERENTIAL/PLATELET
BASOS PCT: 0 % (ref 0–1)
Basophils Absolute: 0 10*3/uL (ref 0.0–0.1)
EOS ABS: 0.1 10*3/uL (ref 0.0–0.7)
EOS PCT: 2 % (ref 0–5)
HCT: 36.3 % (ref 36.0–46.0)
Hemoglobin: 11.4 g/dL — ABNORMAL LOW (ref 12.0–15.0)
Lymphocytes Relative: 41 % (ref 12–46)
Lymphs Abs: 3.7 10*3/uL (ref 0.7–4.0)
MCH: 24.5 pg — AB (ref 26.0–34.0)
MCHC: 31.4 g/dL (ref 30.0–36.0)
MCV: 78.1 fL (ref 78.0–100.0)
Monocytes Absolute: 0.9 10*3/uL (ref 0.1–1.0)
Monocytes Relative: 10 % (ref 3–12)
Neutro Abs: 4.3 10*3/uL (ref 1.7–7.7)
Neutrophils Relative %: 47 % (ref 43–77)
PLATELETS: 364 10*3/uL (ref 150–400)
RBC: 4.65 MIL/uL (ref 3.87–5.11)
RDW: 17.2 % — AB (ref 11.5–15.5)
WBC: 9.1 10*3/uL (ref 4.0–10.5)

## 2014-02-01 LAB — D-DIMER, QUANTITATIVE: D-Dimer, Quant: 0.68 ug/mL-FEU — ABNORMAL HIGH (ref 0.00–0.48)

## 2014-02-01 LAB — PRO B NATRIURETIC PEPTIDE: PRO B NATRI PEPTIDE: 371.1 pg/mL — AB (ref 0–125)

## 2014-02-01 MED ORDER — ALBUTEROL SULFATE HFA 108 (90 BASE) MCG/ACT IN AERS
4.0000 | INHALATION_SPRAY | Freq: Once | RESPIRATORY_TRACT | Status: AC
  Administered 2014-02-01: 4 via RESPIRATORY_TRACT
  Filled 2014-02-01: qty 6.7

## 2014-02-01 MED ORDER — PREDNISONE 20 MG PO TABS
50.0000 mg | ORAL_TABLET | Freq: Once | ORAL | Status: AC
  Administered 2014-02-01: 50 mg via ORAL
  Filled 2014-02-01: qty 3

## 2014-02-01 MED ORDER — IPRATROPIUM-ALBUTEROL 0.5-2.5 (3) MG/3ML IN SOLN
3.0000 mL | Freq: Once | RESPIRATORY_TRACT | Status: AC
  Administered 2014-02-01: 3 mL via RESPIRATORY_TRACT
  Filled 2014-02-01: qty 3

## 2014-02-01 MED ORDER — IOHEXOL 350 MG/ML SOLN
80.0000 mL | Freq: Once | INTRAVENOUS | Status: AC | PRN
  Administered 2014-02-01: 80 mL via INTRAVENOUS

## 2014-02-01 MED ORDER — ESOMEPRAZOLE MAGNESIUM 40 MG PO CPDR: 40.0000 mg | DELAYED_RELEASE_CAPSULE | Freq: Every day | ORAL | Status: DC

## 2014-02-01 MED ORDER — PREDNISONE 50 MG PO TABS: 50.0000 mg | ORAL_TABLET | Freq: Every day | ORAL | Status: AC

## 2014-02-01 NOTE — ED Provider Notes (Signed)
CSN: [id number]     Arrival date & time 02/01/14  1508 History   First MD Initiated Contact with Patient 02/01/14 1531     Chief Complaint  Patient presents with  . Leg Swelling     (Consider location/radiation/quality/duration/timing/severity/associated sxs/prior Treatment) HPI Tamara Silva 53 y.o. with a PMH of CAD, STEMI in April 2015, COPD (2L [place] at home at all times), who presents to the ED for BLE swelling, SOB and productive cough. Swelling has been worsening in the past 2 days. It is severe in severity. Localized to bilateral LE. She was instructed by her doctor to increase her Lasix dose from 40 mg to 80 mg, which she did and does not report any improvement. No known exacerbating or relieving factors. Associated with worsening SOB. She has baseline SOB, but can typically ambulate in her home without difficulty. She now cannot ambulate to her kitchen with becoming severely SOB. She denies any chest pain now or in the past week. She also is having a cough productive of brown sputum. Cough has been ongoing for 2-3 days as well. Productive of brown sputum. No fever, nausea, vomiting, diarrhea, abdominal pain.   Past Medical History  Diagnosis Date  . Diabetes mellitus without complication   . PE (pulmonary embolism)   . Hypercholesterolemia   . Hypertension   . COPD (chronic obstructive pulmonary disease)   . Asthma   . Coronary artery disease   . CHF (congestive heart failure)   . Mental disorder     BIPOLAR  . Shortness of breath   . GERD (gastroesophageal reflux disease)   . Neuromuscular disorder     DIABETIC NEUROPATHY  . Ischemic cardiomyopathy   . Bipolar disorder   . Post-traumatic stress syndrome    Past Surgical History  Procedure Laterality Date  . Coronary angioplasty with stent placement      CAD in 2006 x 2 and 2009 2 more- place din REx in [place] and [place] [facility]  . Esophagogastroduodenoscopy (egd) with propofol N/A 08/03/2013    Procedure:  ESOPHAGOGASTRODUODENOSCOPY (EGD) WITH PROPOFOL;  Surgeon: Jeryl Columbia, MD;  Location: [facility];  Service: Endoscopy;  Laterality: N/A;  . Colonoscopy with propofol N/A 08/03/2013    Procedure: COLONOSCOPY WITH PROPOFOL;  Surgeon: Jeryl Columbia, MD;  Location: [facility];  Service: Endoscopy;  Laterality: N/A;  . Coronary angioplasty with stent placement  10/07/2013    Xience [place] DES 2.75  mm x 15  mm   Family History  Problem Relation Age of Onset  . Stroke Mother   . Cancer Father    History  Substance Use Topics  . Smoking status: Former Smoker    Types: Cigarettes    Quit date: 11/24/2012  . Smokeless tobacco: Never Used  . Alcohol Use: No   OB History   Grav Para Term Preterm Abortions TAB SAB Ect Mult Living                 Review of Systems  Respiratory: Positive for cough, shortness of breath and wheezing. Negative for choking, chest tightness and stridor.   Cardiovascular: Positive for leg swelling. Negative for palpitations.  Gastrointestinal: Negative for nausea, vomiting, abdominal pain, constipation, blood in stool, abdominal distention and rectal pain.  All other systems reviewed and are negative.     Allergies  Metolazone; Plavix; and Nsaids  Home Medications   Prior to Admission medications   Medication Sig Start Date End Date Taking? Authorizing Provider  acetaminophen (TYLENOL) 500  MG tablet Take 1,000 mg by mouth every 6 (six) hours as needed for mild pain or moderate pain (Lower back pain).    Yes Historical Provider, MD  albuterol (PROVENTIL HFA;VENTOLIN HFA) 108 (90 BASE) MCG/ACT inhaler Inhale 2 puffs into the lungs every 6 (six) hours as needed for wheezing or shortness of breath.  06/29/13  Yes Dorie Rank, MD  albuterol (PROVENTIL) (2.5 MG/3ML) 0.083% nebulizer solution Take 6 mLs (5 mg total) by nebulization every 4 (four) hours as needed for wheezing or shortness of breath. 06/24/13  Yes Jennifer L Piepenbrink, PA-C  aspirin 81 MG tablet Take 1  tablet (81 mg total) by mouth daily. 10/09/13  Yes Rhonda G Barrett, PA-C  carvedilol (COREG) 25 MG tablet Take 12.5 mg by mouth 2 (two) times daily with a meal.    Yes Historical Provider, MD  cycloSPORINE (RESTASIS) 0.05 % ophthalmic emulsion Place 2 drops into both eyes 2 (two) times daily as needed (for dry eyes).    Yes Historical Provider, MD  diazepam (VALIUM) 10 MG tablet Take 0.5 tablets (5 mg total) by mouth every 8 (eight) hours as needed for anxiety. 07/16/13  Yes Allie Bossier, MD  fluticasone [facility]) 50 MCG/ACT nasal spray Place 2 sprays into the nose 2 (two) times daily.    Yes Historical Provider, MD  furosemide (LASIX) 40 MG tablet Take 2 tablets (80 mg total) by mouth 2 (two) times daily. 10/09/13  Yes Rhonda G Barrett, PA-C  insulin aspart (NOVOLOG) 100 UNIT/ML injection Inject 15-20 Units into the skin 3 (three) times daily before meals. Sliding scale   Yes Historical Provider, MD  insulin detemir (LEVEMIR) 100 UNIT/ML injection Inject 35 Units into the skin at bedtime.    Yes Historical Provider, MD  lisinopril (PRINIVIL,ZESTRIL) 10 MG tablet Take 1 tablet (10 mg total) by mouth daily. 10/09/13  Yes Rhonda G Barrett, PA-C  nitroGLYCERIN (NITROSTAT) 0.4 MG SL tablet Place 1 tablet (0.4 mg total) under the tongue every 5 (five) minutes as needed for chest pain. 02/08/13  Yes Lorretta Harp, MD  omeprazole (PRILOSEC) 40 MG capsule Take 40 mg by mouth 2 (two) times daily.   Yes Historical Provider, MD  Paliperidone Palmitate (INVEGA SUSTENNA) 117 MG/0.75ML SUSP Inject 156 mg into the muscle every 30 (thirty) days. One injection monthly. On the 16th of every month   Yes Historical Provider, MD  potassium chloride (K-DUR) 10 MEQ tablet Take 10 mEq by mouth 2 (two) times daily.    Yes Historical Provider, MD  prasugrel (EFFIENT) 10 MG TABS tablet Take 1 tablet (10 mg total) by mouth daily. 10/09/13  Yes Rhonda G Barrett, PA-C  pravastatin (PRAVACHOL) 40 MG tablet Take 40 mg by mouth every  evening.    Yes Historical Provider, MD  Blood Glucose Monitoring Suppl (ACCU-CHEK ADVANTAGE DIABETES) kit Use as instructed 01/24/14   Domenic Moras, PA-C  esomeprazole (NEXIUM) 40 MG capsule Take 1 capsule (40 mg total) by mouth daily. 02/01/14   Kelby Aline, MD  predniSONE (DELTASONE) 50 MG tablet Take 1 tablet (50 mg total) by mouth daily. 02/02/14 02/05/14  Kelby Aline, MD   BP 137/67  Pulse 77  Temp(Src) 98.3 F (36.8 C) (Oral)  Resp 20  SpO2 98%  LMP 05/30/2013 Physical Exam  Constitutional: She is oriented to person, place, and time. She appears well-developed and well-nourished. No distress.  Morbidly obese  HENT:  Head: Normocephalic and atraumatic.  Right Ear: External ear normal.  Left Ear: External ear  normal.  Eyes: Conjunctivae and EOM are normal. Right eye exhibits no discharge. Left eye exhibits no discharge.  Neck: Normal range of motion. Neck supple. No JVD present.  Cardiovascular: Normal rate, regular rhythm and normal heart sounds.  Exam reveals no gallop and no friction rub.   No murmur heard. Pulmonary/Chest: Effort normal. No stridor. No respiratory distress. She has wheezes (expiratory diffusely bilaterally). She has rales (minimal at bilateral lung bases). She exhibits no tenderness.  Abdominal: Soft. Bowel sounds are normal. She exhibits no distension. There is no tenderness. There is no rebound and no guarding.  Musculoskeletal: She exhibits edema (2+ bilateral LE edema to the proximal tibia).  Homan's negative bilaterally  Neurological: She is alert and oriented to person, place, and time.  Skin: Skin is warm. No rash noted. She is not diaphoretic.  Psychiatric: She has a normal mood and affect. Her behavior is normal.    ED Course  Procedures (including critical care time) Labs Review Labs Reviewed  PRO B NATRIURETIC PEPTIDE - Abnormal; Notable for the following:    Pro B Natriuretic peptide (BNP) 371.1 (*)    All other components within normal limits   CBC WITH DIFFERENTIAL - Abnormal; Notable for the following:    Hemoglobin 11.4 (*)    MCH 24.5 (*)    RDW 17.2 (*)    All other components within normal limits  COMPREHENSIVE METABOLIC PANEL - Abnormal; Notable for the following:    Potassium 3.6 (*)    Glucose, Bld 69 (*)    Albumin 3.2 (*)    Total Bilirubin <0.2 (*)    GFR calc non Af Amer 71 (*)    GFR calc Af Amer 83 (*)    All other components within normal limits  D-DIMER, QUANTITATIVE - Abnormal; Notable for the following:    D-Dimer, Quant 0.68 (*)    All other components within normal limits  I-STAT TROPOININ, ED    Imaging Review Dg Chest 2 View  02/01/2014   CLINICAL DATA:  Leg swelling with shortness of breath and cough for few weeks. History of hypertension, diabetes and COPD/asthma.  EXAM: CHEST  2 VIEW  COMPARISON:  Radiographs 01/21/2014 and 12/26/2013.  CT 07/28/2013.  FINDINGS: The heart size and mediastinal contours are stable. There is stable chronic lung disease with asymmetric pleural parenchymal scarring on the left. No superimposed airspace disease, edema or significant pleural effusion is seen. The osseous structures appear unchanged.  IMPRESSION: Stable chronic lung disease.  No acute cardiopulmonary process.   Electronically Signed   By: Camie Patience M.D.   On: 02/01/2014 18:11   Ct Angio Chest Pe W/cm &/or Wo Cm  02/01/2014   CLINICAL DATA:  Chest pain, cough, history of pulmonary embolism.  EXAM: CT ANGIOGRAPHY CHEST WITH CONTRAST  TECHNIQUE: Multidetector CT imaging of the chest was performed using the standard protocol during bolus administration of intravenous contrast. Multiplanar CT image reconstructions and MIPs were obtained to evaluate the vascular anatomy.  CONTRAST:  75m OMNIPAQUE IOHEXOL 350 MG/ML SOLN  COMPARISON:  07/20/2013  FINDINGS: Chronic narrowing/occlusion of the right lower lobe pulmonary artery (series 5/ image 133), compatible with sequela of prior pulmonary embolism.  No findings  suspicious for acute pulmonary embolism.  Evaluation of the lung parenchyma is constrained by respiratory motion. Stable scarring in the left upper and lower lobes. Calcified pleural plaques with trace left pleural fluid. No suspicious pulmonary nodules. No pneumothorax.  Visualized thyroid is unremarkable.  Heart is top-normal in size.  No pericardial effusion. Coronary atherosclerosis with coronary stent.  Small mediastinal nodes, including a 10 mm short axis right paratracheal node and 11 mm short axis subcarinal node, grossly unchanged.  Visualized upper abdomen is unremarkable.  Mild degenerative changes of the visualized thoracolumbar spine.  Review of the MIP images confirms the above findings.  IMPRESSION: No evidence of acute pulmonary embolism.  Chronic narrowing/occlusion of the right lower lobe pulmonary artery, likely reflecting sequela of prior/chronic pulmonary embolism.  Additional stable ancillary findings as above.   Electronically Signed   By: Julian Hy M.D.   On: 02/01/2014 19:38     EKG Interpretation   Date/Time:  Friday February 01 2014 15:37:05 EDT Ventricular Rate:  77 PR Interval:  166 QRS Duration: 74 QT Interval:  465 QTC Calculation: 526 R Axis:   62 Text Interpretation:  Sinus rhythm Ventricular premature complex Anterior  infarct, old Nonspecific T abnormalities, lateral leads Prolonged QT  interval No significant change since last tracing Confirmed by ALLEN  MD,  ANTHONY ([id number]) on 02/01/2014 4:05:07 PM      MDM   Final diagnoses:  COPD exacerbation  Gastroesophageal reflux disease without esophagitis    Pt with CAD, MI, and COPD presents with SOB. Tried 6 nebulized albuterol txs at home without any improvement. History of PE as well in the past, but bridges off anticoagulation. + productive cough as well. + BLE edema. Wheezing most prominent on exam. Given her previous PE, and really no improvement with multiple bronchodilator treatments at home  - I  am concerned for PE. Plan for CT PE study, once Cr returns. Gave a DuoNeb treatment as well.   CTA PE negative. BNP not suggestive of acute CHF exacerbation. Trop obtained reflexively, and neg. I do have a low suspicion of ACS given her presentation. Gave another DuoNeb treatment with near resolution of wheezing, and subjective improvement. Gave steroids. Safe to dc home. Gave 4 puffs albuterol prior to dc. Will need to fu with her PCP for her COPD and LE edema. Likely dependent edema and gave education regarding supporting measures. Gave an rx for prednisone for 4 more days, 50 mg qd. 4 puffs of the albuterol MDI every 6 hours for the next 2 days. She was in agreement with the plan and I answered all of her questions.   Strong return precautions given for worsening symptoms or any other alarming or concerning symptoms or issues. The patient was in agreement with the treatment plan and I answered all of their questions. The patient was stable for dc. At dc, the patient ambulated without difficulty, was moving all four extremities, symptoms improved, NAD. and AOx4 Care discussed with my attending, Dr. Zenia Resides. If performed and available, imaging studies and labs reviewed.    Kelby Aline, MD 02/02/14 [id number]

## 2014-02-01 NOTE — ED Notes (Signed)
Pt is here with increased swelling to LE and all over and states that her fluid pills are not helping.  Pt reports sob with exertion and coughing up thick brown mucous

## 2014-02-01 NOTE — ED Notes (Signed)
Patient transported to X-ray 

## 2014-02-01 NOTE — ED Provider Notes (Signed)
I saw and evaluated the patient, reviewed the resident's note and I agree with the findings and plan.   EKG Interpretation   Date/Time:  Friday February 01 2014 15:37:05 EDT Ventricular Rate:  77 PR Interval:  166 QRS Duration: 74 QT Interval:  465 QTC Calculation: 526 R Axis:   62 Text Interpretation:  Sinus rhythm Ventricular premature complex Anterior  infarct, old Nonspecific T abnormalities, lateral leads Prolonged QT  interval No significant change since last tracing Confirmed by Anoop Hemmer  MD,  Bridgitte Felicetti ([id number]) on 02/01/2014 4:05:07 PM     Patient here complaining of a one-week history of worsening dyspnea on exertion along with her doctor cough. Lung exam is with mild to moderate wheezing. Patient used nebulizer treatments at home without relief. We'll obtain lab work and chest x-ray here. Anticipate admission   Leota Jacobsen, MD 02/01/14 1606

## 2014-02-03 NOTE — ED Provider Notes (Signed)
I saw and evaluated the patient, reviewed the resident's note and I agree with the findings and plan.   EKG Interpretation   Date/Time:  Friday February 01 2014 15:37:05 EDT Ventricular Rate:  77 PR Interval:  166 QRS Duration: 74 QT Interval:  465 QTC Calculation: 526 R Axis:   62 Text Interpretation:  Sinus rhythm Ventricular premature complex Anterior  infarct, old Nonspecific T abnormalities, lateral leads Prolonged QT  interval No significant change since last tracing Confirmed by Zenia Resides  MD,  Jedaiah Rathbun ([id number]) on 02/01/2014 4:05:07 PM       Leota Jacobsen, MD 02/03/14 [phone]

## 2014-02-04 ENCOUNTER — Encounter (HOSPITAL_COMMUNITY): Payer: Medicaid Other | Attending: Cardiology

## 2014-02-04 DIAGNOSIS — I5042 Chronic combined systolic (congestive) and diastolic (congestive) heart failure: Secondary | ICD-10-CM | POA: Insufficient documentation

## 2014-02-04 DIAGNOSIS — I2589 Other forms of chronic ischemic heart disease: Secondary | ICD-10-CM | POA: Insufficient documentation

## 2014-02-04 DIAGNOSIS — I2510 Atherosclerotic heart disease of native coronary artery without angina pectoris: Secondary | ICD-10-CM | POA: Insufficient documentation

## 2014-02-04 DIAGNOSIS — Z5189 Encounter for other specified aftercare: Secondary | ICD-10-CM | POA: Insufficient documentation

## 2014-02-04 DIAGNOSIS — I2119 ST elevation (STEMI) myocardial infarction involving other coronary artery of inferior wall: Secondary | ICD-10-CM | POA: Insufficient documentation

## 2014-02-06 ENCOUNTER — Encounter (HOSPITAL_COMMUNITY): Payer: Medicaid Other

## 2014-02-06 ENCOUNTER — Encounter (HOSPITAL_COMMUNITY): Payer: Self-pay | Admitting: Emergency Medicine

## 2014-02-06 ENCOUNTER — Inpatient Hospital Stay (HOSPITAL_COMMUNITY)
Admission: EM | Admit: 2014-02-06 | Discharge: 2014-02-08 | DRG: 190 | Disposition: A | Discharge status: Home Health | Payer: Medicaid Other | Attending: Internal Medicine | Admitting: Internal Medicine

## 2014-02-06 ENCOUNTER — Emergency Department (HOSPITAL_COMMUNITY): Payer: Medicaid Other

## 2014-02-06 DIAGNOSIS — Z86711 Personal history of pulmonary embolism: Secondary | ICD-10-CM | POA: Diagnosis not present

## 2014-02-06 DIAGNOSIS — Z6841 Body Mass Index (BMI) 40.0 and over, adult: Secondary | ICD-10-CM

## 2014-02-06 DIAGNOSIS — E119 Type 2 diabetes mellitus without complications: Secondary | ICD-10-CM

## 2014-02-06 DIAGNOSIS — Z79899 Other long term (current) drug therapy: Secondary | ICD-10-CM

## 2014-02-06 DIAGNOSIS — K219 Gastro-esophageal reflux disease without esophagitis: Secondary | ICD-10-CM | POA: Diagnosis present

## 2014-02-06 DIAGNOSIS — J9620 Acute and chronic respiratory failure, unspecified whether with hypoxia or hypercapnia: Secondary | ICD-10-CM | POA: Diagnosis present

## 2014-02-06 DIAGNOSIS — F319 Bipolar disorder, unspecified: Secondary | ICD-10-CM | POA: Diagnosis present

## 2014-02-06 DIAGNOSIS — E1142 Type 2 diabetes mellitus with diabetic polyneuropathy: Secondary | ICD-10-CM | POA: Diagnosis present

## 2014-02-06 DIAGNOSIS — I10 Essential (primary) hypertension: Secondary | ICD-10-CM

## 2014-02-06 DIAGNOSIS — J9601 Acute respiratory failure with hypoxia: Secondary | ICD-10-CM

## 2014-02-06 DIAGNOSIS — Z91199 Patient's noncompliance with other medical treatment and regimen due to unspecified reason: Secondary | ICD-10-CM

## 2014-02-06 DIAGNOSIS — I509 Heart failure, unspecified: Secondary | ICD-10-CM | POA: Diagnosis present

## 2014-02-06 DIAGNOSIS — Z7982 Long term (current) use of aspirin: Secondary | ICD-10-CM

## 2014-02-06 DIAGNOSIS — R0602 Shortness of breath: Secondary | ICD-10-CM | POA: Diagnosis not present

## 2014-02-06 DIAGNOSIS — Z9861 Coronary angioplasty status: Secondary | ICD-10-CM | POA: Diagnosis not present

## 2014-02-06 DIAGNOSIS — E7800 Pure hypercholesterolemia, unspecified: Secondary | ICD-10-CM | POA: Diagnosis present

## 2014-02-06 DIAGNOSIS — Z9981 Dependence on supplemental oxygen: Secondary | ICD-10-CM | POA: Diagnosis not present

## 2014-02-06 DIAGNOSIS — I2589 Other forms of chronic ischemic heart disease: Secondary | ICD-10-CM | POA: Diagnosis present

## 2014-02-06 DIAGNOSIS — Z794 Long term (current) use of insulin: Secondary | ICD-10-CM | POA: Diagnosis not present

## 2014-02-06 DIAGNOSIS — N182 Chronic kidney disease, stage 2 (mild): Secondary | ICD-10-CM | POA: Diagnosis present

## 2014-02-06 DIAGNOSIS — Z9119 Patient's noncompliance with other medical treatment and regimen: Secondary | ICD-10-CM | POA: Diagnosis not present

## 2014-02-06 DIAGNOSIS — E1149 Type 2 diabetes mellitus with other diabetic neurological complication: Secondary | ICD-10-CM | POA: Diagnosis present

## 2014-02-06 DIAGNOSIS — I5042 Chronic combined systolic (congestive) and diastolic (congestive) heart failure: Secondary | ICD-10-CM | POA: Diagnosis present

## 2014-02-06 DIAGNOSIS — I2510 Atherosclerotic heart disease of native coronary artery without angina pectoris: Secondary | ICD-10-CM | POA: Diagnosis present

## 2014-02-06 DIAGNOSIS — J9600 Acute respiratory failure, unspecified whether with hypoxia or hypercapnia: Secondary | ICD-10-CM

## 2014-02-06 DIAGNOSIS — Z23 Encounter for immunization: Secondary | ICD-10-CM

## 2014-02-06 DIAGNOSIS — J441 Chronic obstructive pulmonary disease with (acute) exacerbation: Secondary | ICD-10-CM | POA: Diagnosis present

## 2014-02-06 DIAGNOSIS — I255 Ischemic cardiomyopathy: Secondary | ICD-10-CM

## 2014-02-06 LAB — I-STAT ARTERIAL BLOOD GAS, ED
Acid-Base Excess: 9 mmol/L — ABNORMAL HIGH (ref 0.0–2.0)
BICARBONATE: 36.1 meq/L — AB (ref 20.0–24.0)
O2 Saturation: 79 %
PCO2 ART: 57.4 mmHg — AB (ref 35.0–45.0)
PH ART: 7.408 (ref 7.350–7.450)
Patient temperature: 99.3
TCO2: 38 mmol/L (ref 0–100)
pO2, Arterial: 45 mmHg — ABNORMAL LOW (ref 80.0–100.0)

## 2014-02-06 LAB — CBC
HCT: 40.6 % (ref 36.0–46.0)
HCT: 36.2 % (ref 36.0–46.0)
HEMOGLOBIN: 11.4 g/dL — AB (ref 12.0–15.0)
Hemoglobin: 12.7 g/dL (ref 12.0–15.0)
MCH: 24.8 pg — ABNORMAL LOW (ref 26.0–34.0)
MCH: 24.7 pg — ABNORMAL LOW (ref 26.0–34.0)
MCHC: 31.3 g/dL (ref 30.0–36.0)
MCHC: 31.5 g/dL (ref 30.0–36.0)
MCV: 79.3 fL (ref 78.0–100.0)
MCV: 78.4 fL (ref 78.0–100.0)
PLATELETS: 344 10*3/uL (ref 150–400)
Platelets: 342 10*3/uL (ref 150–400)
RBC: 5.12 MIL/uL — ABNORMAL HIGH (ref 3.87–5.11)
RBC: 4.62 MIL/uL (ref 3.87–5.11)
RDW: 17 % — ABNORMAL HIGH (ref 11.5–15.5)
RDW: 16.9 % — ABNORMAL HIGH (ref 11.5–15.5)
WBC: 9.5 10*3/uL (ref 4.0–10.5)
WBC: 9.9 10*3/uL (ref 4.0–10.5)

## 2014-02-06 LAB — BASIC METABOLIC PANEL
Anion gap: 14 (ref 5–15)
BUN: 16 mg/dL (ref 6–23)
CO2: 31 mEq/L (ref 19–32)
Calcium: 8.7 mg/dL (ref 8.4–10.5)
Chloride: 96 mEq/L (ref 96–112)
Creatinine, Ser: 0.91 mg/dL (ref 0.50–1.10)
GFR calc Af Amer: 81 mL/min — ABNORMAL LOW (ref >90)
GFR, EST NON AFRICAN AMERICAN: 70 mL/min — AB (ref >90)
Glucose, Bld: 108 mg/dL — ABNORMAL HIGH (ref 70–99)
Potassium: 4 mEq/L (ref 3.7–5.3)
SODIUM: 141 meq/L (ref 137–147)

## 2014-02-06 LAB — MRSA PCR SCREENING: MRSA by PCR: NEGATIVE

## 2014-02-06 LAB — CREATININE, SERUM
CREATININE: 0.93 mg/dL (ref 0.50–1.10)
GFR calc Af Amer: 79 mL/min — ABNORMAL LOW (ref >90)
GFR, EST NON AFRICAN AMERICAN: 68 mL/min — AB (ref >90)

## 2014-02-06 LAB — I-STAT TROPONIN, ED: Troponin i, poc: 0 ng/mL (ref 0.00–0.08)

## 2014-02-06 LAB — PRO B NATRIURETIC PEPTIDE: PRO B NATRI PEPTIDE: 405.8 pg/mL — AB (ref 0–125)

## 2014-02-06 LAB — GLUCOSE, CAPILLARY: Glucose-Capillary: 350 mg/dL — ABNORMAL HIGH (ref 70–99)

## 2014-02-06 MED ORDER — ALBUTEROL SULFATE (2.5 MG/3ML) 0.083% IN NEBU: 5.0000 mg | INHALATION_SOLUTION | RESPIRATORY_TRACT | Status: AC | PRN

## 2014-02-06 MED ORDER — MAGNESIUM SULFATE 40 MG/ML IJ SOLN
2.0000 g | Freq: Once | INTRAMUSCULAR | Status: AC
  Administered 2014-02-06: 2 g via INTRAVENOUS
  Filled 2014-02-06: qty 50

## 2014-02-06 MED ORDER — INSULIN ASPART 100 UNIT/ML ~~LOC~~ SOLN: 15.0000 [IU] | Freq: Three times a day (TID) | SUBCUTANEOUS | Status: DC

## 2014-02-06 MED ORDER — PANTOPRAZOLE SODIUM 40 MG PO TBEC
40.0000 mg | DELAYED_RELEASE_TABLET | Freq: Every day | ORAL | Status: DC
  Administered 2014-02-07: 40 mg via ORAL
  Filled 2014-02-06: qty 1

## 2014-02-06 MED ORDER — SODIUM CHLORIDE 0.9 % IV SOLN: 250.0000 mL | INTRAVENOUS | Status: DC | PRN

## 2014-02-06 MED ORDER — INFLUENZA VAC SPLIT QUAD 0.5 ML IM SUSY
0.5000 mL | PREFILLED_SYRINGE | INTRAMUSCULAR | Status: AC
  Administered 2014-02-08: 0.5 mL via INTRAMUSCULAR
  Filled 2014-02-06 (×2): qty 0.5

## 2014-02-06 MED ORDER — FUROSEMIDE 10 MG/ML IJ SOLN
40.0000 mg | Freq: Once | INTRAMUSCULAR | Status: AC
  Administered 2014-02-06: 40 mg via INTRAVENOUS
  Filled 2014-02-06: qty 4

## 2014-02-06 MED ORDER — DIAZEPAM 5 MG PO TABS: 5.0000 mg | ORAL_TABLET | Freq: Three times a day (TID) | ORAL | Status: DC | PRN

## 2014-02-06 MED ORDER — INSULIN ASPART 100 UNIT/ML ~~LOC~~ SOLN
0.0000 [IU] | Freq: Every day | SUBCUTANEOUS | Status: DC
  Administered 2014-02-06: 5 [IU] via SUBCUTANEOUS

## 2014-02-06 MED ORDER — INSULIN DETEMIR 100 UNIT/ML ~~LOC~~ SOLN
35.0000 [IU] | Freq: Every day | SUBCUTANEOUS | Status: DC
  Administered 2014-02-06: 35 [IU] via SUBCUTANEOUS
  Filled 2014-02-06 (×3): qty 0.35

## 2014-02-06 MED ORDER — SODIUM CHLORIDE 0.9 % IJ SOLN: 3.0000 mL | INTRAMUSCULAR | Status: DC | PRN

## 2014-02-06 MED ORDER — ONDANSETRON HCL 4 MG/2ML IJ SOLN: 4.0000 mg | Freq: Four times a day (QID) | INTRAMUSCULAR | Status: DC | PRN

## 2014-02-06 MED ORDER — ASPIRIN 81 MG PO TABS: 81.0000 mg | ORAL_TABLET | Freq: Every day | ORAL | Status: DC

## 2014-02-06 MED ORDER — ALBUTEROL SULFATE (2.5 MG/3ML) 0.083% IN NEBU: 2.5000 mg | INHALATION_SOLUTION | RESPIRATORY_TRACT | Status: DC | PRN

## 2014-02-06 MED ORDER — FLUTICASONE PROPIONATE 50 MCG/ACT NA SUSP
2.0000 | Freq: Two times a day (BID) | NASAL | Status: DC
  Administered 2014-02-06 – 2014-02-08 (×4): 2 via NASAL
  Filled 2014-02-06: qty 16

## 2014-02-06 MED ORDER — CARVEDILOL 12.5 MG PO TABS
12.5000 mg | ORAL_TABLET | Freq: Two times a day (BID) | ORAL | Status: DC
  Administered 2014-02-07 – 2014-02-08 (×4): 12.5 mg via ORAL
  Filled 2014-02-06 (×5): qty 1

## 2014-02-06 MED ORDER — ACETAMINOPHEN 325 MG PO TABS: 650.0000 mg | ORAL_TABLET | Freq: Four times a day (QID) | ORAL | Status: DC | PRN

## 2014-02-06 MED ORDER — SIMVASTATIN 20 MG PO TABS
20.0000 mg | ORAL_TABLET | Freq: Every day | ORAL | Status: DC
  Administered 2014-02-07 – 2014-02-08 (×2): 20 mg via ORAL
  Filled 2014-02-06 (×2): qty 1

## 2014-02-06 MED ORDER — HEPARIN SODIUM (PORCINE) 5000 UNIT/ML IJ SOLN
5000.0000 [IU] | Freq: Three times a day (TID) | INTRAMUSCULAR | Status: DC
  Administered 2014-02-06 – 2014-02-08 (×6): 5000 [IU] via SUBCUTANEOUS
  Filled 2014-02-06 (×8): qty 1

## 2014-02-06 MED ORDER — IPRATROPIUM-ALBUTEROL 0.5-2.5 (3) MG/3ML IN SOLN
3.0000 mL | Freq: Once | RESPIRATORY_TRACT | Status: AC
  Administered 2014-02-06: 3 mL via RESPIRATORY_TRACT
  Filled 2014-02-06: qty 3

## 2014-02-06 MED ORDER — ALUM & MAG HYDROXIDE-SIMETH 200-200-20 MG/5ML PO SUSP: 30.0000 mL | Freq: Four times a day (QID) | ORAL | Status: DC | PRN

## 2014-02-06 MED ORDER — POTASSIUM CHLORIDE ER 10 MEQ PO TBCR
10.0000 meq | EXTENDED_RELEASE_TABLET | Freq: Two times a day (BID) | ORAL | Status: DC
  Administered 2014-02-06 – 2014-02-08 (×4): 10 meq via ORAL
  Filled 2014-02-06 (×5): qty 1

## 2014-02-06 MED ORDER — IPRATROPIUM-ALBUTEROL 0.5-2.5 (3) MG/3ML IN SOLN
3.0000 mL | RESPIRATORY_TRACT | Status: DC
  Administered 2014-02-06 – 2014-02-08 (×12): 3 mL via RESPIRATORY_TRACT
  Filled 2014-02-06 (×12): qty 3

## 2014-02-06 MED ORDER — DEXTROSE 5 % IV SOLN
500.0000 mg | INTRAVENOUS | Status: DC
  Filled 2014-02-06: qty 500

## 2014-02-06 MED ORDER — PRASUGREL HCL 10 MG PO TABS
10.0000 mg | ORAL_TABLET | Freq: Every day | ORAL | Status: DC
  Administered 2014-02-07 – 2014-02-08 (×2): 10 mg via ORAL
  Filled 2014-02-06 (×2): qty 1

## 2014-02-06 MED ORDER — METHYLPREDNISOLONE SODIUM SUCC 40 MG IJ SOLR
40.0000 mg | Freq: Two times a day (BID) | INTRAMUSCULAR | Status: DC
  Administered 2014-02-07 – 2014-02-08 (×3): 40 mg via INTRAVENOUS
  Filled 2014-02-06 (×5): qty 1

## 2014-02-06 MED ORDER — SODIUM CHLORIDE 0.9 % IJ SOLN
3.0000 mL | Freq: Two times a day (BID) | INTRAMUSCULAR | Status: DC
  Administered 2014-02-06 – 2014-02-08 (×4): 3 mL via INTRAVENOUS

## 2014-02-06 MED ORDER — ASPIRIN EC 81 MG PO TBEC
81.0000 mg | DELAYED_RELEASE_TABLET | Freq: Every day | ORAL | Status: DC
  Administered 2014-02-07 – 2014-02-08 (×2): 81 mg via ORAL
  Filled 2014-02-06 (×2): qty 1

## 2014-02-06 MED ORDER — ONDANSETRON HCL 4 MG PO TABS: 4.0000 mg | ORAL_TABLET | Freq: Four times a day (QID) | ORAL | Status: DC | PRN

## 2014-02-06 MED ORDER — IPRATROPIUM BROMIDE 0.02 % IN SOLN
0.5000 mg | Freq: Once | RESPIRATORY_TRACT | Status: AC
  Administered 2014-02-06: 0.5 mg via RESPIRATORY_TRACT
  Filled 2014-02-06: qty 2.5

## 2014-02-06 MED ORDER — LISINOPRIL 10 MG PO TABS
10.0000 mg | ORAL_TABLET | Freq: Every day | ORAL | Status: DC
  Administered 2014-02-07 – 2014-02-08 (×2): 10 mg via ORAL
  Filled 2014-02-06 (×2): qty 1

## 2014-02-06 MED ORDER — ALBUTEROL SULFATE (2.5 MG/3ML) 0.083% IN NEBU: 5.0000 mg | INHALATION_SOLUTION | RESPIRATORY_TRACT | Status: DC | PRN

## 2014-02-06 MED ORDER — ALBUTEROL (5 MG/ML) CONTINUOUS INHALATION SOLN
15.0000 mg/h | INHALATION_SOLUTION | Freq: Once | RESPIRATORY_TRACT | Status: AC
  Administered 2014-02-06: 15 mg/h via RESPIRATORY_TRACT
  Filled 2014-02-06: qty 20

## 2014-02-06 MED ORDER — METHYLPREDNISOLONE SODIUM SUCC 125 MG IJ SOLR
125.0000 mg | Freq: Once | INTRAMUSCULAR | Status: AC
  Administered 2014-02-06: 125 mg via INTRAVENOUS
  Filled 2014-02-06: qty 2

## 2014-02-06 MED ORDER — FUROSEMIDE 10 MG/ML IJ SOLN
80.0000 mg | Freq: Two times a day (BID) | INTRAMUSCULAR | Status: DC
  Administered 2014-02-07 – 2014-02-08 (×3): 80 mg via INTRAVENOUS
  Filled 2014-02-06 (×4): qty 8

## 2014-02-06 MED ORDER — CYCLOSPORINE 0.05 % OP EMUL
2.0000 [drp] | Freq: Two times a day (BID) | OPHTHALMIC | Status: DC | PRN
  Filled 2014-02-06: qty 1

## 2014-02-06 MED ORDER — INSULIN ASPART 100 UNIT/ML ~~LOC~~ SOLN
0.0000 [IU] | Freq: Three times a day (TID) | SUBCUTANEOUS | Status: DC
  Administered 2014-02-07: 7 [IU] via SUBCUTANEOUS
  Administered 2014-02-07: 20 [IU] via SUBCUTANEOUS
  Administered 2014-02-07: 11 [IU] via SUBCUTANEOUS
  Administered 2014-02-08 (×2): 7 [IU] via SUBCUTANEOUS
  Administered 2014-02-08: 15 [IU] via SUBCUTANEOUS

## 2014-02-06 MED ORDER — ACETAMINOPHEN 650 MG RE SUPP: 650.0000 mg | Freq: Four times a day (QID) | RECTAL | Status: DC | PRN

## 2014-02-06 NOTE — ED Notes (Signed)
RT contacted to start continuous neb.

## 2014-02-06 NOTE — ED Notes (Signed)
IV team at bedside 

## 2014-02-06 NOTE — ED Notes (Signed)
Attempted report x1. 

## 2014-02-06 NOTE — ED Notes (Signed)
Pt co sob and per medics pt called medics for same pt calls ems almost daily for nebulizer treatments per medics duoneb x 1 pta

## 2014-02-06 NOTE — ED Notes (Signed)
RT at bedside.

## 2014-02-06 NOTE — ED Provider Notes (Signed)
CSN: [id number]     Arrival date & time 02/06/14  1152 History   First MD Initiated Contact with Patient 02/06/14 1201     Chief Complaint  Patient presents with  . Shortness of Breath     (Consider location/radiation/quality/duration/timing/severity/associated sxs/prior Treatment) HPI Patient has a history of PE, COPD, congestive heart failure. She reports over the past week she's had shortness of breath, wheezing, cough productive of white sputum, and some chest tightness and swelling of her extremities for about 2 weeks. She denies fever or chest pain. EMS gave her one nebulizer which she states helped. Patient reports exertion makes her breathing worse. Patient is on oxygen at home which she wears at night and as needed during the day. She's been wearing her oxygen during the day.  PCP Dr Vista Lawman  Past Medical History  Diagnosis Date  . Diabetes mellitus without complication   . PE (pulmonary embolism)   . Hypercholesterolemia   . Hypertension   . COPD (chronic obstructive pulmonary disease)   . Asthma   . Coronary artery disease   . CHF (congestive heart failure)   . Mental disorder     BIPOLAR  . Shortness of breath   . GERD (gastroesophageal reflux disease)   . Neuromuscular disorder     DIABETIC NEUROPATHY  . Ischemic cardiomyopathy   . Bipolar disorder   . Post-traumatic stress syndrome    Past Surgical History  Procedure Laterality Date  . Coronary angioplasty with stent placement      CAD in 2006 x 2 and 2009 2 more- place din REx in [place] and [place] med  . Esophagogastroduodenoscopy (egd) with propofol N/A 08/03/2013    Procedure: ESOPHAGOGASTRODUODENOSCOPY (EGD) WITH PROPOFOL;  Surgeon: Jeryl Columbia, MD;  Location: [facility];  Service: Endoscopy;  Laterality: N/A;  . Colonoscopy with propofol N/A 08/03/2013    Procedure: COLONOSCOPY WITH PROPOFOL;  Surgeon: Jeryl Columbia, MD;  Location: [facility];  Service: Endoscopy;  Laterality: N/A;  . Coronary  angioplasty with stent placement  10/07/2013    Xience Alpine DES 2.75  mm x 15  mm   Family History  Problem Relation Age of Onset  . Stroke Mother   . Cancer Father    History  Substance Use Topics  . Smoking status: Former Smoker    Types: Cigarettes    Quit date: 11/24/2012  . Smokeless tobacco: Never Used  . Alcohol Use: No   Home oxygen 2 lpm [place] Lives at home Lives with boyfriend  OB History   Grav Para Term Preterm Abortions TAB SAB Ect Mult Living                 Review of Systems  All other systems reviewed and are negative.     Allergies  Metolazone; Plavix; and Nsaids  Home Medications   Prior to Admission medications   Medication Sig Start Date End Date Taking? Authorizing Provider  albuterol (PROVENTIL HFA;VENTOLIN HFA) 108 (90 BASE) MCG/ACT inhaler Inhale 2 puffs into the lungs every 6 (six) hours as needed for wheezing or shortness of breath.  06/29/13  Yes Dorie Rank, MD  albuterol (PROVENTIL) (2.5 MG/3ML) 0.083% nebulizer solution Take 6 mLs (5 mg total) by nebulization every 4 (four) hours as needed for wheezing or shortness of breath. 06/24/13  Yes Jennifer L Piepenbrink, PA-C  aspirin 81 MG tablet Take 1 tablet (81 mg total) by mouth daily. 10/09/13  Yes Rhonda G Barrett, PA-C  carvedilol (COREG) 25 MG  tablet Take 12.5 mg by mouth 2 (two) times daily with a meal.    Yes Historical Provider, MD  cycloSPORINE (RESTASIS) 0.05 % ophthalmic emulsion Place 2 drops into both eyes 2 (two) times daily as needed (for dry eyes).    Yes Historical Provider, MD  diazepam (VALIUM) 10 MG tablet Take 0.5 tablets (5 mg total) by mouth every 8 (eight) hours as needed for anxiety. 07/16/13  Yes Allie Bossier, MD  esomeprazole (NEXIUM) 40 MG capsule Take 40 mg by mouth 2 (two) times daily.   Yes Historical Provider, MD  fluticasone (FLONASE) 50 MCG/ACT nasal spray Place 2 sprays into both nostrils 2 (two) times daily.    Yes Historical Provider, MD  furosemide (LASIX) 40 MG  tablet Take 2 tablets (80 mg total) by mouth 2 (two) times daily. 10/09/13  Yes Rhonda G Barrett, PA-C  Glucose Blood (ACCU-CHEK ADVANTAGE TEST VI) 1 each by Other route See admin instructions. Check blood sugar 4 times daily.   Yes Historical Provider, MD  insulin aspart (NOVOLOG) 100 UNIT/ML injection Inject 15-20 Units into the skin 3 (three) times daily before meals. Sliding scale   Yes Historical Provider, MD  insulin detemir (LEVEMIR) 100 UNIT/ML injection Inject 35 Units into the skin at bedtime.    Yes Historical Provider, MD  lisinopril (PRINIVIL,ZESTRIL) 10 MG tablet Take 1 tablet (10 mg total) by mouth daily. 10/09/13  Yes Rhonda G Barrett, PA-C  nitroGLYCERIN (NITROSTAT) 0.4 MG SL tablet Place 1 tablet (0.4 mg total) under the tongue every 5 (five) minutes as needed for chest pain. 02/08/13  Yes Lorretta Harp, MD  omeprazole (PRILOSEC) 40 MG capsule Take 40 mg by mouth 2 (two) times daily.   Yes Historical Provider, MD  Paliperidone Palmitate (INVEGA SUSTENNA) 117 MG/0.75ML SUSP Inject 156 mg into the muscle every 30 (thirty) days. One injection monthly. On the 16th of every month   Yes Historical Provider, MD  potassium chloride (K-DUR) 10 MEQ tablet Take 10 mEq by mouth 2 (two) times daily.    Yes Historical Provider, MD  prasugrel (EFFIENT) 10 MG TABS tablet Take 1 tablet (10 mg total) by mouth daily. 10/09/13  Yes Rhonda G Barrett, PA-C  pravastatin (PRAVACHOL) 40 MG tablet Take 40 mg by mouth every evening.    Yes Historical Provider, MD   BP 162/77  Pulse 101  Temp(Src) 99.3 F (37.4 C) (Oral)  Resp 42  Ht 5\' 6"  (1.676 m)  Wt 325 lb (147.419 kg)  BMI 52.48 kg/m2  SpO2 97%  LMP 05/30/2013  Vital signs normal except tachycardia   Physical Exam  Nursing note and vitals reviewed. Constitutional: She is oriented to person, place, and time. She appears well-developed and well-nourished.  Non-toxic appearance. She does not appear ill. No distress.  HENT:  Head: Normocephalic  and atraumatic.  Right Ear: External ear normal.  Left Ear: External ear normal.  Nose: Nose normal. No mucosal edema or rhinorrhea.  Mouth/Throat: Oropharynx is clear and moist and mucous membranes are normal. No dental abscesses or uvula swelling.  Eyes: Conjunctivae and EOM are normal. Pupils are equal, round, and reactive to light.  Neck: Normal range of motion and full passive range of motion without pain. Neck supple.  Cardiovascular: Normal rate, regular rhythm and normal heart sounds.  Exam reveals no gallop and no friction rub.   No murmur heard. Pulmonary/Chest: Accessory muscle usage present. Tachypnea noted. No respiratory distress. She has decreased breath sounds. She has wheezes. She has  no rhonchi. She has no rales. She exhibits no tenderness and no crepitus.  Abdominal: Soft. Normal appearance and bowel sounds are normal. She exhibits no distension. There is no tenderness. There is no rebound and no guarding.  Musculoskeletal: Normal range of motion. She exhibits no edema and no tenderness.  No pitting edema  Neurological: She is alert and oriented to person, place, and time. She has normal strength. No cranial nerve deficit.  Skin: Skin is warm, dry and intact. No rash noted. No erythema. No pallor.  Psychiatric: She has a normal mood and affect. Her speech is normal and behavior is normal. Her mood appears not anxious.    ED Course  Procedures (including critical care time)  Medications  magnesium sulfate IVPB 2 g 50 mL (2 g Intravenous New Bag/Given 02/06/14 1652)  ipratropium-albuterol (DUONEB) 0.5-2.5 (3) MG/3ML nebulizer solution 3 mL (3 mLs Nebulization Given 02/06/14 1207)  albuterol (PROVENTIL,VENTOLIN) solution continuous neb (15 mg/hr Nebulization Given 02/06/14 1456)  ipratropium (ATROVENT) nebulizer solution 0.5 mg (0.5 mg Nebulization Given 02/06/14 1457)  methylPREDNISolone sodium succinate (SOLU-MEDROL) 125 mg/2 mL injection 125 mg (125 mg Intravenous Given 02/06/14  1652)    14:30 pt still SOB after nebulizer, pt does not talk to me. Has difficulty sitting up for me to listen to her lungs.   16:10 pt rechecked after continuous nebulizer. Still has audible wheeze at time, diffuse expir wheezing. Discussed admission.    16:23 Dr Wynelle Cleveland admit to tele, team 10, wants BNP and lasix 40 mg IV to be given.   Labs Review Results for orders placed during the hospital encounter of 0000000  BASIC METABOLIC PANEL      Result Value Ref Range   Sodium 141  137 - 147 mEq/L   Potassium 4.0  3.7 - 5.3 mEq/L   Chloride 96  96 - 112 mEq/L   CO2 31  19 - 32 mEq/L   Glucose, Bld 108 (*) 70 - 99 mg/dL   BUN 16  6 - 23 mg/dL   Creatinine, Ser 0.91  0.50 - 1.10 mg/dL   Calcium 8.7  8.4 - 10.5 mg/dL   GFR calc non Af Amer 70 (*) >90 mL/min   GFR calc Af Amer 81 (*) >90 mL/min   Anion gap 14  5 - 15  CBC      Result Value Ref Range   WBC 9.5  4.0 - 10.5 K/uL   RBC 5.12 (*) 3.87 - 5.11 MIL/uL   Hemoglobin 12.7  12.0 - 15.0 g/dL   HCT 40.6  36.0 - 46.0 %   MCV 79.3  78.0 - 100.0 fL   MCH 24.8 (*) 26.0 - 34.0 pg   MCHC 31.3  30.0 - 36.0 g/dL   RDW 17.0 (*) 11.5 - 15.5 %   Platelets 344  150 - 400 K/uL  I-STAT TROPOININ, ED      Result Value Ref Range   Troponin i, poc 0.00  0.00 - 0.08 ng/mL   Comment 3             Laboratory interpretation all normal   Dg Chest 2 View (if Patient Has Fever And/or Copd)  02/06/2014   CLINICAL DATA:  Shortness of Breath   IMPRESSION: Chronic changes in the left lung stable from the previous exam.   Electronically Signed   By: Inez Catalina M.D.   On: 02/06/2014 13:32   Dg Chest 2 View  02/01/2014   CLINICAL DATA:  Leg swelling with  shortness of breath and cough for few weeks. History of hypertension, diabetes and COPD/asthma. IMPRESSION: Stable chronic lung disease.  No acute cardiopulmonary process.   Electronically Signed   By: Camie Patience M.D.   On: 02/01/2014 18:11   Dg Chest 2 View  01/21/2014   CLINICAL DATA:  Short of  breath for 2 days. History of hypertension. COPD. Asthma. Congestive heart failure. Ex-smoker.  IMPRESSION: No acute cardiopulmonary disease.  Borderline cardiomegaly, without congestive failure.  Peribronchial thickening which may relate to chronic bronchitis or smoking.   Electronically Signed   By: Abigail Miyamoto M.D.   On: 01/21/2014 16:12   Ct Angio Chest Pe W/cm &/or Wo Cm  02/01/2014   CLINICAL DATA:  Chest pain, cough, history of pulmonary embolism. IMPRESSION: No evidence of acute pulmonary embolism.  Chronic narrowing/occlusion of the right lower lobe pulmonary artery, likely reflecting sequela of prior/chronic pulmonary embolism.  Additional stable ancillary findings as above.   Electronically Signed   By: Julian Hy M.D.   On: 02/01/2014 19:38      Imaging Review Dg Chest 2 View (if Patient Has Fever And/or Copd)  02/06/2014   CLINICAL DATA:  Shortness of Breath  EXAM: CHEST  2 VIEW  COMPARISON:  02/01/1949  FINDINGS: Cardiac shadow is stable. Patchy changes are again noted in the left lung similar to that seen on the prior exam. No acute infiltrate is seen. No acute bony abnormality is noted.  IMPRESSION: Chronic changes in the left lung stable from the previous exam.   Electronically Signed   By: Inez Catalina M.D.   On: 02/06/2014 13:32     EKG Interpretation   Date/Time:  Wednesday February 06 2014 12:01:07 EDT Ventricular Rate:  94 PR Interval:  142 QRS Duration: 87 QT Interval:  371 QTC Calculation: 464 R Axis:   40 Text Interpretation:  Sinus rhythm Ventricular trigeminy Inferior infarct,  old Anterior infarct, old ST elevation in Inferior leads Since last  tracing 01 Feb 2014 T wave inversion no longer evident in Lateral leads  Confirmed by Justyce Baby  MD-I, Timmy Bubeck ([id number]) on 02/06/2014 12:04:53 PM      MDM   Final diagnoses:  COPD exacerbation    Plan admission   Rolland Porter, MD, FACEP   CRITICAL CARE Performed by: Rolland Porter L Total critical care time: 40  min Critical care time was exclusive of separately billable procedures and treating other patients. Critical care was necessary to treat or prevent imminent or life-threatening deterioration. Critical care was time spent personally by me on the following activities: development of treatment plan with patient and/or surrogate as well as nursing, discussions with consultants, evaluation of patient's response to treatment, examination of patient, obtaining history from patient or surrogate, ordering and performing treatments and interventions, ordering and review of laboratory studies, ordering and review of radiographic studies, pulse oximetry and re-evaluation of patient's condition.     Janice Norrie, MD 02/06/14 1726

## 2014-02-06 NOTE — H&P (Signed)
[facility] History and Physical  Tamara Silva [id number] DOB: 1961/05/21 DOA: 02/06/2014  PCP: Philis Fendt, MD    Chief Complaint: shortness of breath  HPI: Tamara Silva is a 53 y.o. female with morbid obesity, DM, COPD, CAD, CHF presents for shortness of breath going on for 2 days now. She has had an increase in cough which is productive of white sputum. She had not had any fevers or chest pain. She has noted that her ankles are more swollen as well and tells me that she has been taking her medications including her Lasix appropriately as ordered. She has not been weighing herself daily and cannot tell if she has gained weight. In the ER she is noted to be short of breath and wheezing- she was given a neb treatment and felt that it helped her. She is not otherwise very forthcoming with a history and it is difficult to obtain further details.    General: The patient denies anorexia, fever, weight loss Cardiac: Denies chest pain, syncope, palpitations-  + for pedal edema  Respiratory: per HPI GI: Denies severe indigestion/heartburn, abdominal pain, nausea, vomiting, diarrhea and constipation GU: Denies hematuria, incontinence, dysuria  Musculoskeletal: Denies arthritis  Skin: Denies suspicious skin lesions Neurologic: Denies focal weakness or numbness, change in vision  Past Medical History  Diagnosis Date  . Diabetes mellitus without complication   . PE (pulmonary embolism)   . Hypercholesterolemia   . Hypertension   . COPD (chronic obstructive pulmonary disease)   . Asthma   . Coronary artery disease   . CHF (congestive heart failure)   . Mental disorder     BIPOLAR  . Shortness of breath   . GERD (gastroesophageal reflux disease)   . Neuromuscular disorder     DIABETIC NEUROPATHY  . Ischemic cardiomyopathy   . Bipolar disorder   . Post-traumatic stress syndrome     Past Surgical History  Procedure Laterality Date  . Coronary angioplasty with stent  placement      CAD in 2006 x 2 and 2009 2 more- place din REx in [place] and [place] med  . Esophagogastroduodenoscopy (egd) with propofol N/A 08/03/2013    Procedure: ESOPHAGOGASTRODUODENOSCOPY (EGD) WITH PROPOFOL;  Surgeon: Jeryl Columbia, MD;  Location: [facility];  Service: Endoscopy;  Laterality: N/A;  . Colonoscopy with propofol N/A 08/03/2013    Procedure: COLONOSCOPY WITH PROPOFOL;  Surgeon: Jeryl Columbia, MD;  Location: [facility];  Service: Endoscopy;  Laterality: N/A;  . Coronary angioplasty with stent placement  10/07/2013    Xience Alpine DES 2.75  mm x 15  mm    Social History: does not smoke or drink alcohol    Allergies  Allergen Reactions  . Metolazone Other (See Comments)    Dizziness and falling  . Plavix [Clopidogrel Bisulfate] Other (See Comments)    High PRU's - non-responder  . Nsaids Other (See Comments)    "I do not take NSAIDs d/t interfering with other meds"    Family History  Problem Relation Age of Onset  . Stroke Mother   . Cancer Father       Prior to Admission medications   Medication Sig Start Date End Date Taking? Authorizing Provider  albuterol (PROVENTIL HFA;VENTOLIN HFA) 108 (90 BASE) MCG/ACT inhaler Inhale 2 puffs into the lungs every 6 (six) hours as needed for wheezing or shortness of breath.  06/29/13  Yes Dorie Rank, MD  albuterol (PROVENTIL) (2.5 MG/3ML) 0.083% nebulizer solution Take 6 mLs (5 mg total) by  nebulization every 4 (four) hours as needed for wheezing or shortness of breath. 06/24/13  Yes Jennifer L Piepenbrink, PA-C  aspirin 81 MG tablet Take 1 tablet (81 mg total) by mouth daily. 10/09/13  Yes Rhonda G Barrett, PA-C  carvedilol (COREG) 25 MG tablet Take 12.5 mg by mouth 2 (two) times daily with a meal.    Yes Historical Provider, MD  cycloSPORINE (RESTASIS) 0.05 % ophthalmic emulsion Place 2 drops into both eyes 2 (two) times daily as needed (for dry eyes).    Yes Historical Provider, MD  diazepam (VALIUM) 10 MG tablet Take 0.5  tablets (5 mg total) by mouth every 8 (eight) hours as needed for anxiety. 07/16/13  Yes Allie Bossier, MD  esomeprazole (NEXIUM) 40 MG capsule Take 40 mg by mouth 2 (two) times daily.   Yes Historical Provider, MD  fluticasone (FLONASE) 50 MCG/ACT nasal spray Place 2 sprays into both nostrils 2 (two) times daily.    Yes Historical Provider, MD  furosemide (LASIX) 40 MG tablet Take 2 tablets (80 mg total) by mouth 2 (two) times daily. 10/09/13  Yes Rhonda G Barrett, PA-C  Glucose Blood (ACCU-CHEK ADVANTAGE TEST VI) 1 each by Other route See admin instructions. Check blood sugar 4 times daily.   Yes Historical Provider, MD  insulin aspart (NOVOLOG) 100 UNIT/ML injection Inject 15-20 Units into the skin 3 (three) times daily before meals. Sliding scale   Yes Historical Provider, MD  insulin detemir (LEVEMIR) 100 UNIT/ML injection Inject 35 Units into the skin at bedtime.    Yes Historical Provider, MD  lisinopril (PRINIVIL,ZESTRIL) 10 MG tablet Take 1 tablet (10 mg total) by mouth daily. 10/09/13  Yes Rhonda G Barrett, PA-C  nitroGLYCERIN (NITROSTAT) 0.4 MG SL tablet Place 1 tablet (0.4 mg total) under the tongue every 5 (five) minutes as needed for chest pain. 02/08/13  Yes Lorretta Harp, MD  omeprazole (PRILOSEC) 40 MG capsule Take 40 mg by mouth 2 (two) times daily.   Yes Historical Provider, MD  Paliperidone Palmitate (INVEGA SUSTENNA) 117 MG/0.75ML SUSP Inject 156 mg into the muscle every 30 (thirty) days. One injection monthly. On the 16th of every month   Yes Historical Provider, MD  potassium chloride (K-DUR) 10 MEQ tablet Take 10 mEq by mouth 2 (two) times daily.    Yes Historical Provider, MD  prasugrel (EFFIENT) 10 MG TABS tablet Take 1 tablet (10 mg total) by mouth daily. 10/09/13  Yes Rhonda G Barrett, PA-C  pravastatin (PRAVACHOL) 40 MG tablet Take 40 mg by mouth every evening.    Yes Historical Provider, MD     Physical Exam: Filed Vitals:   02/06/14 1501 02/06/14 1557 02/06/14 1658  02/06/14 1746  BP:  145/79 139/79 138/67  Pulse:  110 103 103  Temp:      TempSrc:      Resp:  25 35 38  Height:      Weight:      SpO2: 97% 100% 94% 96%     General: AAO x 3, mild resp distress noted but able to get out of bed without increase in dyspnea HEENT: Normocephalic and Atraumatic, Mucous membranes pink                PERRLA; EOM intact; No scleral icterus,                 Nares: Patent, Oropharynx: Clear, Fair Dentition  Neck: FROM, no cervical lymphadenopathy, thyromegaly, carotid bruit or JVD;  Breasts: deferred CHEST WALL: No tenderness  CHEST: rapid respiration, poor air entry, upper airway rhonchi noted, unable to hear crackles over the loud rhonci HEART: Regular rate and rhythm; no murmurs rubs or gallops  BACK: No kyphosis or scoliosis; no CVA tenderness  ABDOMEN: Positive Bowel Sounds, soft, non-tender; no masses, no organomegaly Rectal Exam: deferred EXTREMITIES: No cyanosis, clubbing + edema Genitalia: not examined  SKIN:  no rash or ulceration  CNS: Alert and Oriented x 4, Nonfocal exam, CN 2-12 intact  Labs on Admission:  Basic Metabolic Panel:  Recent Labs Lab 02/01/14 1616 02/06/14 1200  NA 141 141  K 3.6* 4.0  CL 99 96  CO2 32 31  GLUCOSE 69* 108*  BUN 12 16  CREATININE 0.90 0.91  CALCIUM 8.8 8.7   Liver Function Tests:  Recent Labs Lab 02/01/14 1616  AST 16  ALT 13  ALKPHOS 78  BILITOT <0.2*  PROT 8.0  ALBUMIN 3.2*   No results found for this basename: LIPASE, AMYLASE,  in the last 168 hours No results found for this basename: AMMONIA,  in the last 168 hours CBC:  Recent Labs Lab 02/01/14 1616 02/06/14 1200  WBC 9.1 9.5  NEUTROABS 4.3  --   HGB 11.4* 12.7  HCT 36.3 40.6  MCV 78.1 79.3  PLT 364 344   Cardiac Enzymes: No results found for this basename: CKTOTAL, CKMB, CKMBINDEX, TROPONINI,  in the last 168 hours  BNP (last 3 results)  Recent Labs  01/21/14 1745 02/01/14 1616 02/06/14 1727  PROBNP  171.3* 371.1* 405.8*   CBG: No results found for this basename: GLUCAP,  in the last 168 hours  Radiological Exams on Admission: Dg Chest 2 View (if Patient Has Fever And/or Copd)  02/06/2014   CLINICAL DATA:  Shortness of Breath  EXAM: CHEST  2 VIEW  COMPARISON:  02/01/1949  FINDINGS: Cardiac shadow is stable. Patchy changes are again noted in the left lung similar to that seen on the prior exam. No acute infiltrate is seen. No acute bony abnormality is noted.  IMPRESSION: Chronic changes in the left lung stable from the previous exam.   Electronically Signed   By: Inez Catalina M.D.   On: 02/06/2014 13:32    EKG: Independently reviewed. NSR  Assessment/Plan Principal Problem:   Acute respiratory failure - possible COPD vs CHF exacerbation - given Solumedrol and Nebs which I will continue - will increase Lasix and would give it IV- follow I and O  - will be admitting to SDU and obtaining a stat ABG as well - she had a CT of the chest last week with was negative for acute PE and no infection was seen.   Active Problems:  Cardiomyopathy, ischemic s/p stenting-   Chronic combined systolic and diastolic congestive heart failure, NYHA class 2; EF 30-35% - as above - cont Coreg, Lisinopril  - cont Effient  H/o Chronic PE - recent CT chest negative for PE    Bipolar disorder - cont home meds      Morbid obesity   Type 2 diabetes mellitus treated with insulin - resume home doses of Insulin - may need to increase doses as she is on Solumedrol   Consulted: none  Code Status: Full code  Family Communication: none  DVT Prophylaxis:Heparin  Time spent: >45 min  [place], MD [facility]  If 7PM-7AM, please contact night-coverage [email or web address] 02/06/2014, 6:20 PM

## 2014-02-07 DIAGNOSIS — J9620 Acute and chronic respiratory failure, unspecified whether with hypoxia or hypercapnia: Secondary | ICD-10-CM

## 2014-02-07 DIAGNOSIS — I5042 Chronic combined systolic (congestive) and diastolic (congestive) heart failure: Secondary | ICD-10-CM

## 2014-02-07 DIAGNOSIS — Z9119 Patient's noncompliance with other medical treatment and regimen: Secondary | ICD-10-CM

## 2014-02-07 DIAGNOSIS — Z91199 Patient's noncompliance with other medical treatment and regimen due to unspecified reason: Secondary | ICD-10-CM

## 2014-02-07 DIAGNOSIS — I10 Essential (primary) hypertension: Secondary | ICD-10-CM

## 2014-02-07 DIAGNOSIS — F319 Bipolar disorder, unspecified: Secondary | ICD-10-CM

## 2014-02-07 DIAGNOSIS — N182 Chronic kidney disease, stage 2 (mild): Secondary | ICD-10-CM

## 2014-02-07 LAB — BASIC METABOLIC PANEL
ANION GAP: 11 (ref 5–15)
BUN: 23 mg/dL (ref 6–23)
CO2: 35 mEq/L — ABNORMAL HIGH (ref 19–32)
Calcium: 8.4 mg/dL (ref 8.4–10.5)
Chloride: 94 mEq/L — ABNORMAL LOW (ref 96–112)
Creatinine, Ser: 0.96 mg/dL (ref 0.50–1.10)
GFR calc Af Amer: 76 mL/min — ABNORMAL LOW (ref >90)
GFR, EST NON AFRICAN AMERICAN: 66 mL/min — AB (ref >90)
Glucose, Bld: 315 mg/dL — ABNORMAL HIGH (ref 70–99)
POTASSIUM: 4.7 meq/L (ref 3.7–5.3)
SODIUM: 140 meq/L (ref 137–147)

## 2014-02-07 LAB — LIPID PANEL
CHOL/HDL RATIO: 2.1 ratio
Cholesterol: 184 mg/dL (ref 0–200)
HDL: 86 mg/dL (ref >39)
LDL CALC: 85 mg/dL (ref 0–99)
Triglycerides: 66 mg/dL (ref <150)
VLDL: 13 mg/dL (ref 0–40)

## 2014-02-07 LAB — CBC
HCT: 37 % (ref 36.0–46.0)
HEMOGLOBIN: 11.5 g/dL — AB (ref 12.0–15.0)
MCH: 24.5 pg — ABNORMAL LOW (ref 26.0–34.0)
MCHC: 31.1 g/dL (ref 30.0–36.0)
MCV: 78.9 fL (ref 78.0–100.0)
PLATELETS: 333 10*3/uL (ref 150–400)
RBC: 4.69 MIL/uL (ref 3.87–5.11)
RDW: 17.1 % — ABNORMAL HIGH (ref 11.5–15.5)
WBC: 8.4 10*3/uL (ref 4.0–10.5)

## 2014-02-07 LAB — GLUCOSE, CAPILLARY
GLUCOSE-CAPILLARY: 373 mg/dL — AB (ref 70–99)
Glucose-Capillary: 300 mg/dL — ABNORMAL HIGH (ref 70–99)
Glucose-Capillary: 209 mg/dL — ABNORMAL HIGH (ref 70–99)
Glucose-Capillary: 143 mg/dL — ABNORMAL HIGH (ref 70–99)

## 2014-02-07 LAB — HEMOGLOBIN A1C
Hgb A1c MFr Bld: 9.7 % — ABNORMAL HIGH (ref <5.7)
Mean Plasma Glucose: 232 mg/dL — ABNORMAL HIGH (ref <117)

## 2014-02-07 MED ORDER — INSULIN ASPART 100 UNIT/ML ~~LOC~~ SOLN
5.0000 [IU] | Freq: Three times a day (TID) | SUBCUTANEOUS | Status: DC
  Administered 2014-02-07 – 2014-02-08 (×6): 5 [IU] via SUBCUTANEOUS

## 2014-02-07 MED ORDER — DIAZEPAM 5 MG PO TABS: 5.0000 mg | ORAL_TABLET | Freq: Three times a day (TID) | ORAL | Status: DC | PRN

## 2014-02-07 MED ORDER — INSULIN DETEMIR 100 UNIT/ML ~~LOC~~ SOLN
40.0000 [IU] | Freq: Every day | SUBCUTANEOUS | Status: DC
  Administered 2014-02-07: 40 [IU] via SUBCUTANEOUS
  Filled 2014-02-07 (×2): qty 0.4

## 2014-02-07 MED ORDER — DIAZEPAM 5 MG PO TABS: 5.0000 mg | ORAL_TABLET | Freq: Two times a day (BID) | ORAL | Status: DC

## 2014-02-07 MED ORDER — LEVOFLOXACIN IN D5W 500 MG/100ML IV SOLN
500.0000 mg | INTRAVENOUS | Status: DC
  Administered 2014-02-07 – 2014-02-08 (×2): 500 mg via INTRAVENOUS
  Filled 2014-02-07 (×3): qty 100

## 2014-02-07 MED ORDER — CETYLPYRIDINIUM CHLORIDE 0.05 % MT LIQD
7.0000 mL | Freq: Two times a day (BID) | OROMUCOSAL | Status: DC
  Administered 2014-02-07 – 2014-02-08 (×4): 7 mL via OROMUCOSAL

## 2014-02-07 MED ORDER — POLYETHYLENE GLYCOL 3350 17 G PO PACK
17.0000 g | PACK | Freq: Two times a day (BID) | ORAL | Status: DC
  Administered 2014-02-07 – 2014-02-08 (×2): 17 g via ORAL
  Filled 2014-02-07 (×4): qty 1

## 2014-02-07 NOTE — Progress Notes (Signed)
Inpatient Diabetes Program Recommendations  AACE/ADA: New Consensus Statement on Inpatient Glycemic Control (2013)  Target Ranges:  Prepandial:   less than 140 mg/dL      Peak postprandial:   less than 180 mg/dL (1-2 hours)      Critically ill patients:  140 - 180 mg/dL   Results for [name], [name] (MRN [id number]) as of 02/07/2014 07:29  Ref. Range 02/07/2014 03:46  Glucose Latest Range: 70-99 mg/dL 315 (H)   Results for [name], [name] (MRN [id number]) as of 02/07/2014 07:29  Ref. Range 02/06/2014 21:13  Glucose-Capillary Latest Range: 70-99 mg/dL 350 (H)   Results for [name], [name] (MRN [id number]) as of 02/07/2014 07:29  Ref. Range 10/07/2013 05:05  Hemoglobin A1C Latest Range: <5.7 % 7.9 (H)   Diabetes history: DM2 Outpatient Diabetes medications: Levemir 35 units QHS, Novolog 15-20 units TID with meals Current orders for Inpatient glycemic control: Levemir 35 units QHS, Novolog 0-20 units AC, Novolog 0-5 units HS  Inpatient Diabetes Program Recommendations Insulin - Basal: If steroids are not decreased, please consider increasing Levemir to 40 units QHS. Insulin - Meal Coverage: While ordered steroids, please consider ordering Novolog 5 units TID with meals for meal coverage. HgbA1C: Last A1C in the chart was 7.9% on 10/07/13. Please order an A1C to evaluate glycemic control over the past 2-3 months.  Thanks, Barnie Alderman, RN, MSN, CCRN Diabetes Coordinator Inpatient Diabetes Program [phone] (Team Pager) [phone] ([facility] office) [phone] [facility] office)

## 2014-02-07 NOTE — Care Management Note (Addendum)
  Page 2 of 2   02/08/2014     4:39:53 PM CARE MANAGEMENT NOTE 02/08/2014  Patient:  Tamara Silva   Account Number:  [id number]  Date Initiated:  02/07/2014  Documentation initiated by:  Felix Ahmadi Assessment:   53 y.o. female with morbid obesity, DM, COPD, CAD, CHF presents for shortness of breath. dx resp failure; lives with SO    PCP  Tamara Silva     Action/Plan:   -IV Solumedrol and Nebs  -  increase IV Lasix  - follow I and O  - will be admitting to SDU //Access for dispostion needs   Anticipated DC Date:     Anticipated DC Plan:    In-house referral  Clinical Social Worker      DC Forensic scientist  CM consult      PAC Choice  [place]   Choice offered to / List presented to:  C-1 Patient   DME arranged  3-N-1  TUB BENCH      DME agency  Tamara Silva arranged  HH-1 RN  [place].   Status of service:  Completed, signed off Medicare Important Message given?   (If response is "NO", the following Medicare IM given date fields will be blank) Date Medicare IM given:   Medicare IM given by:   Date Additional Medicare IM given:   Additional Medicare IM given by:    Discharge Disposition:  [place]  Per UR Regulation:  Reviewed for med. necessity/level of care/duration of stay  If discussed at [place] of Stay Meetings, dates discussed:    Comments:  Tamara Meir RN, BSN, MSHL, CCM  Nurse - Case Manager,  ([facility] [place])  [phone]  02/08/2014 IM:  N/a [id number] on [facility] AVS updated and ([facility]/Tamara Silva notified of d/c)  02/08/14 [place], RN, BSN, [place] [phone] Pt anticipates returnng home today.  Pt does not have transportation; NCM consulted LCSW to assist with transportation home. Pt DME changed from shower chair to tub bench for more support. Tub bench and 3N1 will arrive at pt house; as pt  will be going home by ambulance.  Pt verbalizes understanding.  02/07/14 [place] MSN BSN CCM Discussed home health RN for CHF/DM management and pt agrees.  Provided list of agencies, referral made per choice.  Also requests shower chair and 3-N-1 for home use. Pt states her SO is able to provide 24/7 assistance as he is on disability and that she uses [facility] transportation to get to MD appts - although she sometimes forgets her appts. Requested [facility] RN help her with system to keep appts.  Tamara Silva liaison notified and request relayed.

## 2014-02-07 NOTE — Progress Notes (Signed)
[facility]  Tamara Silva [id number] DOB: 03-11-1961 DOA: 02/06/2014 PCP: No PCP Per Patient   Brief narrative: Patient with underlying morbid obesity and associated obesity hypoventilation syndrome, diabetes mellitus, COPD on chronic nasal cannula oxygen at 2 L, CAD with prior PCI, as well as systolic dysfunction related to ischemic cardiomyopathy. She presented to the hospital because of shortness of breath progressive in nature. This was also associated with productive cough with white sputum without fevers or chest pain. She also noticed increased lower extremity edema despite taking her medications as prescribed. She does not weigh herself daily and was unsure it should actually gained weight.  Upon arrival to the ER she was noted to be quite dyspneic with wheezing. She was given a nebulizer treatment and reported that seemed to help her symptoms. Otherwise history was difficult to obtain at time of admission. Her white count was normal presentation her serum glucose was slightly elevated at 108. Her chest x-ray revealed patchy changes in left lung similar to prior exam without any definitive infiltrate.  Since admission patient reports good diuresis as noticed by increased urinary output. Intake and output does not reflect this. Her weight at admission was documented as 325 pounds but later in the day was 316 pounds and as of today remains at 316 pounds. Case manager was consulted about home health. Patient reported that sometimes she forgets to followup at doctor visits. We have completed a home health face-to-face for RN visits regarding CHF and diabetes mellitus. Her wheezing has improved but she continues to have productive cough concerning for a combination of COPD and bronchitis so we will continue her steroids. She's had poor glycemic control in the setting of steroids so we have adjusted her medications accordingly. Hemoglobin A1c is pending. This  patient has bipolar disorder and sometimes seems to have difficulty understanding medical information.   HPI/Subjective: Continues to complain of cough. No chest pain. Still mildly short of breath.  Assessment/Plan: Active Problems:   Acute and chronic respiratory failure/Hypoxia due to:   A) Ischemic CM/Chronic combined systolic and diastolic congestive heart failure, NYHA class 2; EF 30-35%   B) COPD exacerbation w/ bronchitis -Wheezing has resolved but bibasilar crackles remain-continue IV Lasix for diuresis -daily weights -continue empiric antibiotics -continue supportive care with nebs -continue IV steroids and additional 24 hours -continue ACE inhibitor and carvedilol      Bipolar disorder -Was not on psychotropic medications prior to admission    Type 2 diabetes mellitus treated with insulin -Poorly controlled since admission secondary to steroids-increased home Levemir from 35-40 units-continue resistant sliding scale-add meal coverage-check hemoglobin A1c    CKD (chronic kidney disease), stage II -Follow closely with diuresis    Morbid obesity -Suspect has underlying OSA/OHS but reportedly not on CPAP at home    CAD S/P percutaneous coronary angioplasty - prior PCI to LAD; RCA PCI: new Xience Alpine DES 2.75 mm x 15 mm  -Currently no definitive ischemic symptom  Medical noncompliance -Occupational multiple visits patient aware that she should be weighing herself daily secondary to her heart failure, refuses.  -Patient aware she should be on CPAP refuses (will order CPAP for [facility]HS).   DVT prophylaxis: Subcutaneous heparin Code Status: Full Family Communication: No family at bedside Disposition Plan/Expected LOS: Transfer to telemetry/CHF unit   Consultants: None  Procedures: None  Cultures: None  Antibiotics: Levaquin 9/10 >>>  Objective: Blood pressure 136/70, pulse 78, temperature 97.7 F (36.5 C), temperature  source Oral, resp. rate 23, height 5\' 7"   (1.702 m), weight 316 lb 9.3 oz (143.6 kg), last menstrual period 05/30/2013, SpO2 100.00%.  Intake/Output Summary (Last 24 hours) at 02/07/14 1007 Last data filed at 02/07/14 [id number]  Gross per 24 hour  Intake      0 ml  Output    650 ml  Net   -650 ml     Exam: Gen: No acute respiratory distress Chest: Diminished without wheezing but noted with bilateral crackles from mid fields down, 2 L Cardiac: Regular rate and rhythm, S1-S2, no rubs murmurs or gallops, 1+ bilateral lower extremity peripheral edema, no obvious JVD but difficult to assess due to short thick neck Abdomen: Soft nontender nondistended without obvious hepatosplenomegaly, no ascites Extremities: Symmetrical in appearance without cyanosis, clubbing or effusion  Scheduled Meds:  Scheduled Meds: . antiseptic oral rinse  7 mL Mouth Rinse BID  . aspirin EC  81 mg Oral Daily  . azithromycin  500 mg Intravenous Q24H  . carvedilol  12.5 mg Oral BID WC  . fluticasone  2 spray Each Nare BID  . furosemide  80 mg Intravenous BID  . heparin  5,000 Units Subcutaneous 3 times per day  . Influenza vac split quadrivalent PF  0.5 mL Intramuscular Tomorrow-1000  . insulin aspart  0-20 Units Subcutaneous TID WC  . insulin aspart  0-5 Units Subcutaneous QHS  . insulin aspart  5 Units Subcutaneous TID WC  . insulin detemir  40 Units Subcutaneous QHS  . ipratropium-albuterol  3 mL Nebulization Q4H  . lisinopril  10 mg Oral Daily  . methylPREDNISolone (SOLU-MEDROL) injection  40 mg Intravenous Q12H  . pantoprazole  40 mg Oral Daily  . potassium chloride  10 mEq Oral BID  . prasugrel  10 mg Oral Daily  . simvastatin  20 mg Oral q1800  . sodium chloride  3 mL Intravenous Q12H   Continuous Infusions:   Data Reviewed: Basic Metabolic Panel:  Recent Labs Lab 02/01/14 1616 02/06/14 1200 02/06/14 1944 02/07/14 0346  NA 141 141  --  140  K 3.6* 4.0  --  4.7  CL 99 96  --  94*  CO2 32 31  --  35*  GLUCOSE 69* 108*  --  315*  BUN  12 16  --  23  CREATININE 0.90 0.91 0.93 0.96  CALCIUM 8.8 8.7  --  8.4   Liver Function Tests:  Recent Labs Lab 02/01/14 1616  AST 16  ALT 13  ALKPHOS 78  BILITOT <0.2*  PROT 8.0  ALBUMIN 3.2*   No results found for this basename: LIPASE, AMYLASE,  in the last 168 hours No results found for this basename: AMMONIA,  in the last 168 hours CBC:  Recent Labs Lab 02/01/14 1616 02/06/14 1200 02/06/14 1944 02/07/14 0346  WBC 9.1 9.5 9.9 8.4  NEUTROABS 4.3  --   --   --   HGB 11.4* 12.7 11.4* 11.5*  HCT 36.3 40.6 36.2 37.0  MCV 78.1 79.3 78.4 78.9  PLT 364 344 342 333   Cardiac Enzymes: No results found for this basename: CKTOTAL, CKMB, CKMBINDEX, TROPONINI,  in the last 168 hours BNP (last 3 results)  Recent Labs  01/21/14 1745 02/01/14 1616 02/06/14 1727  PROBNP 171.3* 371.1* 405.8*   CBG:  Recent Labs Lab 02/06/14 2113 02/07/14 0748  GLUCAP 350* 373*    Recent Results (from the past 240 hour(s))  MRSA PCR SCREENING     Status: None  Collection Time    02/06/14  7:26 PM      Result Value Ref Range Status   MRSA by PCR NEGATIVE  NEGATIVE Final   Comment:            The GeneXpert MRSA Assay (FDA     approved for NASAL specimens     only), is one component of a     comprehensive MRSA colonization     surveillance program. It is not     intended to diagnose MRSA     infection nor to guide or     monitor treatment for     MRSA infections.     Studies:  Recent x-ray studies have been reviewed in detail by the Attending Physician  Time spent :      Erin Hearing, [place] [facility]  [phone] Pager [phone]   **If unable to reach the above provider after paging please contact the [name] @ [phone]  On-Call/Text Page:      [email or web address]      password [email or web address]  If 7PM-7AM, please contact night-coverage [email or web address] Password [email or web address] 02/07/2014, 10:07 AM   LOS: 1 day  Examined patient and discussed assessment and plan  with ANP Ebony Hail and agree with the above plan. Patient with multiple complex medical problems> 40 minutes spent in direct patient care

## 2014-02-08 ENCOUNTER — Encounter (HOSPITAL_COMMUNITY): Payer: Medicaid Other

## 2014-02-08 LAB — GLUCOSE, CAPILLARY
Glucose-Capillary: 230 mg/dL — ABNORMAL HIGH (ref 70–99)
Glucose-Capillary: 341 mg/dL — ABNORMAL HIGH (ref 70–99)
Glucose-Capillary: 308 mg/dL — ABNORMAL HIGH (ref 70–99)

## 2014-02-08 LAB — BASIC METABOLIC PANEL
Anion gap: 9 (ref 5–15)
BUN: 31 mg/dL — ABNORMAL HIGH (ref 6–23)
CALCIUM: 8.5 mg/dL (ref 8.4–10.5)
CHLORIDE: 97 meq/L (ref 96–112)
CO2: 35 mEq/L — ABNORMAL HIGH (ref 19–32)
Creatinine, Ser: 0.88 mg/dL (ref 0.50–1.10)
GFR calc Af Amer: 85 mL/min — ABNORMAL LOW (ref >90)
GFR calc non Af Amer: 73 mL/min — ABNORMAL LOW (ref >90)
GLUCOSE: 148 mg/dL — AB (ref 70–99)
Potassium: 4.8 mEq/L (ref 3.7–5.3)
Sodium: 141 mEq/L (ref 137–147)

## 2014-02-08 LAB — CBC
HCT: 35.7 % — ABNORMAL LOW (ref 36.0–46.0)
HEMOGLOBIN: 11 g/dL — AB (ref 12.0–15.0)
MCH: 24.4 pg — AB (ref 26.0–34.0)
MCHC: 30.8 g/dL (ref 30.0–36.0)
MCV: 79.2 fL (ref 78.0–100.0)
Platelets: 318 10*3/uL (ref 150–400)
RBC: 4.51 MIL/uL (ref 3.87–5.11)
RDW: 17.1 % — ABNORMAL HIGH (ref 11.5–15.5)
WBC: 12.1 10*3/uL — AB (ref 4.0–10.5)

## 2014-02-08 MED ORDER — BENZONATATE 100 MG PO CAPS
100.0000 mg | ORAL_CAPSULE | Freq: Three times a day (TID) | ORAL | Status: DC | PRN
  Administered 2014-02-08: 100 mg via ORAL
  Filled 2014-02-08: qty 1

## 2014-02-08 MED ORDER — PREDNISONE (PAK) 10 MG PO TABS: ORAL_TABLET | ORAL | Status: DC

## 2014-02-08 MED ORDER — BENZONATATE 100 MG PO CAPS: 100.0000 mg | ORAL_CAPSULE | Freq: Three times a day (TID) | ORAL | Status: DC | PRN

## 2014-02-08 MED ORDER — PREDNISONE 50 MG PO TABS
60.0000 mg | ORAL_TABLET | Freq: Every day | ORAL | Status: DC
  Administered 2014-02-08: 60 mg via ORAL
  Filled 2014-02-08 (×2): qty 1

## 2014-02-08 MED ORDER — LEVOFLOXACIN 500 MG PO TABS: 500.0000 mg | ORAL_TABLET | Freq: Every day | ORAL | Status: DC

## 2014-02-08 MED ORDER — IPRATROPIUM-ALBUTEROL 0.5-2.5 (3) MG/3ML IN SOLN: 3.0000 mL | RESPIRATORY_TRACT | Status: DC

## 2014-02-08 MED ORDER — PREDNISONE 50 MG PO TABS: 60.0000 mg | ORAL_TABLET | Freq: Every day | ORAL | Status: DC

## 2014-02-08 NOTE — Progress Notes (Signed)
RN:  Pt to d/c home by ambulance.  IF PT FINDS RIDE AND DOES NOT GO BY AMBULANCE;  PLEASE CALL [facility] [phone] TO DELIVER DMEs TO ROOM.   Seren Chaloux J. Clydene Laming, [place], [place], [employer] [phone]

## 2014-02-08 NOTE — Evaluation (Signed)
Occupational Therapy Evaluation Patient Details Name: Tamara Silva MRN: [id number] DOB: [birth year] Today's Date: 02/08/2014    History of Present Illness Tamara Silva is a 53 y.o. female with morbid obesity, DM, COPD, CAD, CHF presents for shortness of breath.    Clinical Impression   Patient evaluated by Occupational Therapy with no further acute OT needs identified. All education has been completed and the patient has no further questions. Pt is modified independent with BADLs.  All education completed.  See below for any follow-up Occupational Therapy or equipment needs. OT is signing off. Thank you for this referral.      Follow Up Recommendations  No OT follow up;Supervision - Intermittent    Equipment Recommendations  3 in 1 bedside comode;Tub/shower bench    Recommendations for Other Services       Precautions / Restrictions Precautions Precaution Comments: monitor O2 Restrictions Weight Bearing Restrictions: No      Mobility Bed Mobility Overal bed mobility: Modified Independent             General bed mobility comments: with rail  Transfers Overall transfer level: Independent Equipment used: None                  Balance Overall balance assessment: No apparent balance deficits (not formally assessed)                                          ADL Overall ADL's : Needs assistance/impaired Eating/Feeding: Independent   Grooming: Wash/dry hands;Wash/dry face;Oral care;Brushing hair   Upper Body Bathing: Set up;Sitting   Lower Body Bathing: Set up;Sit to/from stand   Upper Body Dressing : Set up;Sitting   Lower Body Dressing: Modified independent;Sit to/from stand   Toilet Transfer: Modified Independent;Ambulation;RW   Toileting- Clothing Manipulation and Hygiene: Modified independent;Sit to/from stand       Functional mobility during ADLs: Modified independent;Rolling walker General ADL Comments: Discussed use of  tub transfer bench with pt.  she verbalized undertanding      Vision                     Perception     Praxis      Pertinent Vitals/Pain Pain Assessment: No/denies pain     Hand Dominance     Extremity/Trunk Assessment Upper Extremity Assessment Upper Extremity Assessment: Overall WFL for tasks assessed   Lower Extremity Assessment Lower Extremity Assessment: Defer to PT evaluation   Cervical / Trunk Assessment Cervical / Trunk Assessment: Normal   Communication Communication Communication: No difficulties   Cognition Arousal/Alertness: Awake/alert Behavior During Therapy: WFL for tasks assessed/performed Overall Cognitive Status: Within Functional Limits for tasks assessed                     General Comments       Exercises       Shoulder Instructions      Home Living Family/patient expects to be discharged to:: Private residence Living Arrangements: Spouse/significant other Available Help at Discharge: Family Type of Home: Apartment Home Access: Stairs to enter Technical brewer of Steps: 2   Home Layout: One level     Bathroom Shower/Tub: Tub/shower unit;Curtain Shower/tub characteristics: Architectural technologist: Handicapped height     Home Equipment: Environmental consultant - 2 wheels;Cane - quad;Other (comment) (home O2 2L)          Prior Functioning/Environment Level  of Independence: Independent with assistive device(s)        Comments: states she had rollator but I had to leave it in [place] when she moved to [place] 8 months ago    OT Diagnosis:     OT Problem List:     OT Treatment/Interventions:      OT Goals(Current goals can be found in the care plan section) Acute Rehab OT Goals Patient Stated Goal: to go home  OT Goal Formulation: With patient  OT Frequency:     Barriers to D/C:            Co-evaluation              End of Session Equipment Utilized During Treatment: Rolling  walker;Oxygen Nurse Communication: Mobility status  Activity Tolerance: Patient tolerated treatment well Patient left: in bed;with call bell/phone within reach   Time: 1253-1350 ((-15 mins to gather equip, and speak with nurse)) OT Time Calculation (min): 57 min Charges:  OT General Charges $OT Visit: 1 Procedure OT Evaluation $Initial OT Evaluation Tier I: 1 Procedure OT Treatments $Self Care/Home Management : 8-22 mins $Therapeutic Activity: 8-22 mins G-Codes:    Alencia Gordon M 03/06/14, 2:27 PM

## 2014-02-08 NOTE — Discharge Summary (Signed)
Physician Discharge Summary  Temekia Sullen [id number] DOB: Jun 28, 1960 DOA: 02/06/2014  PCP: No PCP Per Patient  Admit date: 02/06/2014 Discharge date: 02/08/2014  Time spent: 35 minutes  Recommendations for Outpatient Follow-up:  1.  Please follow up appointments status, patient admitted for acute CHF  2.  BMP and CBC in 1 week on hospital followup  Discharge Diagnoses:  Principal Problem:   COPD exacerbation Active Problems:   Cardiomyopathy, ischemic   Bipolar disorder   Chronic combined systolic and diastolic congestive heart failure, NYHA class 2; EF 30-35%   Morbid obesity   Type 2 diabetes mellitus treated with insulin   CAD S/P percutaneous coronary angioplasty - prior PCI to LAD; RCA PCI: new Xience Alpine DES 2.75 mm x 15 mm    Acute and chronic respiratory failure   Hypoxia   CKD (chronic kidney disease), stage II   Discharge Condition: Stable/improved  Diet recommendation: Heart healthy diet  Filed Weights   02/07/14 0400 02/07/14 1023 02/08/14 0534  Weight: 143.6 kg (316 lb 9.3 oz) 141.522 kg (312 lb) 141.295 kg (311 lb 8 oz)    History of present illness:  Tamara Silva is a 53 y.o. female with morbid obesity, DM, COPD, CAD, CHF presents for shortness of breath going on for 2 days now. She has had an increase in cough which is productive of white sputum. She had not had any fevers or chest pain. She has noted that her ankles are more swollen as well and tells me that she has been taking her medications including her Lasix appropriately as ordered. She has not been weighing herself daily and cannot tell if she has gained weight. In the ER she is noted to be short of breath and wheezing- she was given a neb treatment and felt that it helped her. She is not otherwise very forthcoming with a history and it is difficult to obtain further details.   Hospital Course:  Patient is a pleasant 53 year old female with a past medical history of chronic obstructive palmar  disease, chronic hypoxemic respiratory failure on 2 L supplemental oxygen at baseline, congestive heart failure who was admitted to the medicine service on 02/06/2014 presenting with complaints of shortness of breath. Initial workup included a chest x-ray which did not show acute changes. Chronic changes in the left lung were stable when compared to previous exam. On lung exam she did have bilateral expiratory wheezing, as symptoms were felt to be secondary to COPD exacerbation. She was started on steroids, Duonebs, empiric antibiotic therapy. By the following day she reported significant improvement. She ambulated down the hallway to the nurses station and back without any trouble. She expressed her desire to be discharged on this date. Patient remaining hemodynamically stable, afebrile, tolerating by mouth intake. She was discharged in stable condition on 02/08/2014.   Discharge Exam: Filed Vitals:   02/08/14 1022  BP: 109/69  Pulse: 70  Temp:   Resp:     General: Patient is awake alert oriented, ambulating down the hallway without any issues, asking to go home Cardiovascular: Regular rate and rhythm normal S1-S2 Respiratory: Improved lung exam, a few expiratory wheezes Extremities: 1+ bilateral pitting edema to lower extremity  Discharge Instructions You were cared for by a hospitalist during your hospital stay. If you have any questions about your discharge medications or the care you received while you were in the hospital after you are discharged, you can call the unit and asked to speak with the hospitalist on call if  the hospitalist that took care of you is not available. Once you are discharged, your primary care physician will handle any further medical issues. Please note that NO REFILLS for any discharge medications will be authorized once you are discharged, as it is imperative that you return to your primary care physician (or establish a relationship with a primary care physician if  you do not have one) for your aftercare needs so that they can reassess your need for medications and monitor your lab values.  Discharge Instructions   Call MD for:  difficulty breathing, headache or visual disturbances    Complete by:  As directed      Call MD for:  extreme fatigue    Complete by:  As directed      Call MD for:  hives    Complete by:  As directed      Call MD for:  persistant dizziness or light-headedness    Complete by:  As directed      Call MD for:  persistant nausea and vomiting    Complete by:  As directed      Call MD for:  redness, tenderness, or signs of infection (pain, swelling, redness, odor or green/yellow discharge around incision site)    Complete by:  As directed      Call MD for:  severe uncontrolled pain    Complete by:  As directed      Call MD for:  temperature >100.4    Complete by:  As directed      Diet - low sodium heart healthy    Complete by:  As directed      Increase activity slowly    Complete by:  As directed           Current Discharge Medication List    START taking these medications   Details  benzonatate (TESSALON) 100 MG capsule Take 1 capsule (100 mg total) by mouth 3 (three) times daily as needed for cough. Qty: 20 capsule, Refills: 0    levofloxacin (LEVAQUIN) 500 MG tablet Take 1 tablet (500 mg total) by mouth daily. Qty: 5 tablet, Refills: 0    predniSONE (STERAPRED UNI-PAK) 10 MG tablet Take 6-5-4-3-2-1 tablets by mouth daily till gone. Qty: 21 tablet, Refills: 0      CONTINUE these medications which have NOT CHANGED   Details  albuterol (PROVENTIL HFA;VENTOLIN HFA) 108 (90 BASE) MCG/ACT inhaler Inhale 2 puffs into the lungs every 6 (six) hours as needed for wheezing or shortness of breath.     albuterol (PROVENTIL) (2.5 MG/3ML) 0.083% nebulizer solution Take 6 mLs (5 mg total) by nebulization every 4 (four) hours as needed for wheezing or shortness of breath. Qty: 75 mL, Refills: 12    aspirin 81 MG tablet Take  1 tablet (81 mg total) by mouth daily.    carvedilol (COREG) 25 MG tablet Take 12.5 mg by mouth 2 (two) times daily with a meal.     cycloSPORINE (RESTASIS) 0.05 % ophthalmic emulsion Place 2 drops into both eyes 2 (two) times daily as needed (for dry eyes).     diazepam (VALIUM) 10 MG tablet Take 0.5 tablets (5 mg total) by mouth every 8 (eight) hours as needed for anxiety. Qty: 30 tablet, Refills: 0    esomeprazole (NEXIUM) 40 MG capsule Take 40 mg by mouth 2 (two) times daily.    fluticasone (FLONASE) 50 MCG/ACT nasal spray Place 2 sprays into both nostrils 2 (two) times daily.  furosemide (LASIX) 40 MG tablet Take 2 tablets (80 mg total) by mouth 2 (two) times daily. Qty: 120 tablet, Refills: 11    Glucose Blood (ACCU-CHEK ADVANTAGE TEST VI) 1 each by Other route See admin instructions. Check blood sugar 4 times daily.    insulin aspart (NOVOLOG) 100 UNIT/ML injection Inject 15-20 Units into the skin 3 (three) times daily before meals. Sliding scale    insulin detemir (LEVEMIR) 100 UNIT/ML injection Inject 35 Units into the skin at bedtime.     lisinopril (PRINIVIL,ZESTRIL) 10 MG tablet Take 1 tablet (10 mg total) by mouth daily. Qty: 30 tablet, Refills: 11    nitroGLYCERIN (NITROSTAT) 0.4 MG SL tablet Place 1 tablet (0.4 mg total) under the tongue every 5 (five) minutes as needed for chest pain. Qty: 100 tablet, Refills: 3    omeprazole (PRILOSEC) 40 MG capsule Take 40 mg by mouth 2 (two) times daily.    Paliperidone Palmitate (INVEGA SUSTENNA) 117 MG/0.75ML SUSP Inject 156 mg into the muscle every 30 (thirty) days. One injection monthly. On the 16th of every month    potassium chloride (K-DUR) 10 MEQ tablet Take 10 mEq by mouth 2 (two) times daily.     prasugrel (EFFIENT) 10 MG TABS tablet Take 1 tablet (10 mg total) by mouth daily. Qty: 30 tablet, Refills: 11    pravastatin (PRAVACHOL) 40 MG tablet Take 40 mg by mouth every evening.        Allergies  Allergen  Reactions  . Metolazone Other (See Comments)    Dizziness and falling  . Plavix [Clopidogrel Bisulfate] Other (See Comments)    High PRU's - non-responder  . Nsaids Other (See Comments)    "I do not take NSAIDs d/t interfering with other meds"   Follow-up Information   Follow up with Family Medicine. (Appt.@1000am  on 02-11-14)    Contact information:   [facility]                         [id number]       The results of significant diagnostics from this hospitalization (including imaging, microbiology, ancillary and laboratory) are listed below for reference.    Significant Diagnostic Studies: Dg Chest 2 View (if Patient Has Fever And/or Copd)  02/06/2014   CLINICAL DATA:  Shortness of Breath  EXAM: CHEST  2 VIEW  COMPARISON:  02/01/1949  FINDINGS: Cardiac shadow is stable. Patchy changes are again noted in the left lung similar to that seen on the prior exam. No acute infiltrate is seen. No acute bony abnormality is noted.  IMPRESSION: Chronic changes in the left lung stable from the previous exam.   Electronically Signed   By: Inez Catalina M.D.   On: 02/06/2014 13:32   Dg Chest 2 View  02/01/2014   CLINICAL DATA:  Leg swelling with shortness of breath and cough for few weeks. History of hypertension, diabetes and COPD/asthma.  EXAM: CHEST  2 VIEW  COMPARISON:  Radiographs 01/21/2014 and 12/26/2013.  CT 07/28/2013.  FINDINGS: The heart size and mediastinal contours are stable. There is stable chronic lung disease with asymmetric pleural parenchymal scarring on the left. No superimposed airspace disease, edema or significant pleural effusion is seen. The osseous structures appear unchanged.  IMPRESSION: Stable chronic lung disease.  No  acute cardiopulmonary process.   Electronically Signed   By: Camie Patience M.D.   On: 02/01/2014 18:11   Dg Chest 2 View  01/21/2014   CLINICAL DATA:  Short of breath for 2 days. History  of hypertension. COPD. Asthma. Congestive heart failure. Ex-smoker.  EXAM: CHEST  2 VIEW  COMPARISON:  12/26/2013  FINDINGS: Midline trachea. Borderline cardiomegaly. No pleural effusion or pneumothorax. Chronic interstitial thickening. Areas of left upper lobe and left lower lobe scarring are not significantly changed.  IMPRESSION: No acute cardiopulmonary disease.  Borderline cardiomegaly, without congestive failure.  Peribronchial thickening which may relate to chronic bronchitis or smoking.   Electronically Signed   By: Abigail Miyamoto M.D.   On: 01/21/2014 16:12   Ct Angio Chest Pe W/cm &/or Wo Cm  02/01/2014   CLINICAL DATA:  Chest pain, cough, history of pulmonary embolism.  EXAM: CT ANGIOGRAPHY CHEST WITH CONTRAST  TECHNIQUE: Multidetector CT imaging of the chest was performed using the standard protocol during bolus administration of intravenous contrast. Multiplanar CT image reconstructions and MIPs were obtained to evaluate the vascular anatomy.  CONTRAST:  40mL OMNIPAQUE IOHEXOL 350 MG/ML SOLN  COMPARISON:  07/20/2013  FINDINGS: Chronic narrowing/occlusion of the right lower lobe pulmonary artery (series 5/ image 133), compatible with sequela of prior pulmonary embolism.  No findings suspicious for acute pulmonary embolism.  Evaluation of the lung parenchyma is constrained by respiratory motion. Stable scarring in the left upper and lower lobes. Calcified pleural plaques with trace left pleural fluid. No suspicious pulmonary nodules. No pneumothorax.  Visualized thyroid is unremarkable.  Heart is top-normal in size. No pericardial effusion. Coronary atherosclerosis with coronary stent.  Small mediastinal nodes, including a 10 mm short axis right paratracheal node and 11 mm short axis subcarinal node, grossly unchanged.  Visualized upper abdomen is unremarkable.  Mild degenerative changes of the visualized thoracolumbar spine.  Review of the MIP images confirms the above findings.  IMPRESSION: No evidence  of acute pulmonary embolism.  Chronic narrowing/occlusion of the right lower lobe pulmonary artery, likely reflecting sequela of prior/chronic pulmonary embolism.  Additional stable ancillary findings as above.   Electronically Signed   By: Julian Hy M.D.   On: 02/01/2014 19:38    Microbiology: Recent Results (from the past 240 hour(s))  MRSA PCR SCREENING     Status: None   Collection Time    02/06/14  7:26 PM      Result Value Ref Range Status   MRSA by PCR NEGATIVE  NEGATIVE Final   Comment:            The GeneXpert MRSA Assay (FDA     approved for NASAL specimens     only), is one component of a     comprehensive MRSA colonization     surveillance program. It is not     intended to diagnose MRSA     infection nor to guide or     monitor treatment for     MRSA infections.     Labs: Basic Metabolic Panel:  Recent Labs Lab 02/01/14 1616 02/06/14 1200 02/06/14 1944 02/07/14 0346 02/08/14 0342  NA 141 141  --  140 141  K 3.6* 4.0  --  4.7 4.8  CL 99 96  --  94* 97  CO2 32 31  --  35* 35*  GLUCOSE 69* 108*  --  315* 148*  BUN 12 16  --  23 31*  CREATININE 0.90 0.91 0.93 0.96 0.88  CALCIUM  8.8 8.7  --  8.4 8.5   Liver Function Tests:  Recent Labs Lab 02/01/14 1616  AST 16  ALT 13  ALKPHOS 78  BILITOT <0.2*  PROT 8.0  ALBUMIN 3.2*   No results found for this basename: LIPASE, AMYLASE,  in the last 168 hours No results found for this basename: AMMONIA,  in the last 168 hours CBC:  Recent Labs Lab 02/01/14 1616 02/06/14 1200 02/06/14 1944 02/07/14 0346 02/08/14 0342  WBC 9.1 9.5 9.9 8.4 12.1*  NEUTROABS 4.3  --   --   --   --   HGB 11.4* 12.7 11.4* 11.5* 11.0*  HCT 36.3 40.6 36.2 37.0 35.7*  MCV 78.1 79.3 78.4 78.9 79.2  PLT 364 344 342 333 318   Cardiac Enzymes: No results found for this basename: CKTOTAL, CKMB, CKMBINDEX, TROPONINI,  in the last 168 hours BNP: BNP (last 3 results)  Recent Labs  01/21/14 1745 02/01/14 1616  02/06/14 1727  PROBNP 171.3* 371.1* 405.8*   CBG:  Recent Labs Lab 02/07/14 1142 02/07/14 1623 02/07/14 2106 02/08/14 0658 02/08/14 1130  GLUCAP 300* 209* 143* 230* 341*       Signed:  Jahnia Hewes  [facility] 02/08/2014, 2:12 PM

## 2014-02-08 NOTE — Evaluation (Signed)
Physical Therapy Evaluation Patient Details Name: Tamara Silva MRN: [id number] DOB: [birth year] Today's Date: 02/08/2014   History of Present Illness  Tamara Silva is a 53 y.o. female with morbid obesity, DM, COPD, CAD, CHF presents for shortness of breath.   Clinical Impression  **Pt admitted with *COPD exacerbation, CHF, DM*. Pt currently with functional limitations due to the deficits listed below (see PT Problem List).  Pt will benefit from skilled PT to increase their independence and safety with mobility to allow discharge to the venue listed below.   Pt ambulated 40' independently. Distance limited by frequent urination and SOB. SaO2 81% on RA walking, 87% on RA at rest. Applied 2L O2 following walk. 3 in 1 recommended at home. Will follow during acute stay.   *    Follow Up Recommendations No PT follow up    Equipment Recommendations  3in1 (PT)    Recommendations for Other Services       Precautions / Restrictions Precautions Precaution Comments: monitor O2 Restrictions Weight Bearing Restrictions: No      Mobility  Bed Mobility Overal bed mobility: Modified Independent             General bed mobility comments: with rail  Transfers Overall transfer level: Independent Equipment used: None                Ambulation/Gait Ambulation/Gait assistance: Independent Ambulation Distance (Feet): 40 Feet   Gait Pattern/deviations: WFL(Within Functional Limits) Pt steady with ambulation, no LOB. No assistive device.    Gait velocity interpretation: at or above normal speed for age/gender General Gait Details: distance limited by frequent need to urinate, pt on Lasix, and by SOB; SaO2 81% on RA with walking, 87% at rest on RA  Stairs            Wheelchair Mobility    Modified Rankin (Stroke Patients Only)       Balance Overall balance assessment: Independent (pt denied h/o falls)                                            Pertinent Vitals/Pain Pain Assessment: No/denies pain    Home Living Family/patient expects to be discharged to:: Private residence Living Arrangements: Spouse/significant other Available Help at Discharge: Family Type of Home: Apartment Home Access: Stairs to enter   Technical brewer of Steps: 2 Home Layout: One level Home Equipment: Environmental consultant - 2 wheels;Cane - quad;Other (comment) (home O2)      Prior Function Level of Independence: Independent with assistive device(s)               Hand Dominance        Extremity/Trunk Assessment   Upper Extremity Assessment: Overall WFL for tasks assessed           Lower Extremity Assessment: Overall WFL for tasks assessed      Cervical / Trunk Assessment: Normal  Communication   Communication: No difficulties  Cognition Arousal/Alertness: Awake/alert Behavior During Therapy: WFL for tasks assessed/performed Overall Cognitive Status: Within Functional Limits for tasks assessed                      General Comments      Exercises        Assessment/Plan    PT Assessment Patient needs continued PT services  PT Diagnosis Generalized weakness   PT Problem List Decreased  activity tolerance;Decreased mobility;Cardiopulmonary status limiting activity  PT Treatment Interventions Gait training;Functional mobility training;Therapeutic activities;Therapeutic exercise;Patient/family education   PT Goals (Current goals can be found in the Care Plan section) Acute Rehab PT Goals Patient Stated Goal: to decrease frequency of urination PT Goal Formulation: With patient Time For Goal Achievement: 02/22/14 Potential to Achieve Goals: Good    Frequency Min 3X/week   Barriers to discharge        Co-evaluation               End of Session Equipment Utilized During Treatment: Oxygen Activity Tolerance: Patient limited by fatigue Patient left: in bed;with call bell/phone within reach Nurse  Communication: Mobility status         Time: [id number] PT Time Calculation (min): 14 min   Charges:   PT Evaluation $Initial PT Evaluation Tier I: 1 Procedure PT Treatments $Gait Training: 8-22 mins   PT G Codes:          Philomena Doheny 02/08/2014, 11:49 AM [phone]

## 2014-02-08 NOTE — Progress Notes (Signed)
1900 Discharge instruction and prescriptions given > Verbalized understanding . D/c home with a pt's family . To d/ c ambulance transport by charge Nurse

## 2014-02-08 NOTE — Progress Notes (Signed)
Pt placed on full face mask auto titrate CPAP with 2L of O2 bleed in.  Max 16 and min 6

## 2014-02-11 ENCOUNTER — Encounter (HOSPITAL_COMMUNITY): Payer: Medicaid Other

## 2014-02-13 ENCOUNTER — Encounter (HOSPITAL_COMMUNITY): Payer: Medicaid Other

## 2014-02-15 ENCOUNTER — Encounter (HOSPITAL_COMMUNITY): Payer: Medicaid Other

## 2014-02-18 ENCOUNTER — Encounter (HOSPITAL_COMMUNITY): Payer: Medicaid Other

## 2014-02-20 ENCOUNTER — Encounter (HOSPITAL_COMMUNITY): Payer: Medicaid Other

## 2014-02-22 ENCOUNTER — Encounter (HOSPITAL_COMMUNITY): Payer: Medicaid Other

## 2014-02-25 ENCOUNTER — Encounter (HOSPITAL_COMMUNITY): Payer: Medicaid Other

## 2014-02-27 ENCOUNTER — Encounter (HOSPITAL_COMMUNITY): Payer: Medicaid Other

## 2014-03-01 ENCOUNTER — Other Ambulatory Visit: Payer: Self-pay

## 2014-03-01 ENCOUNTER — Encounter (HOSPITAL_COMMUNITY): Payer: Medicaid Other

## 2014-03-01 MED ORDER — NITROGLYCERIN 0.4 MG SL SUBL: 0.4000 mg | SUBLINGUAL_TABLET | SUBLINGUAL | Status: DC | PRN

## 2014-03-01 NOTE — Telephone Encounter (Signed)
Rx sent to pharmacy   

## 2014-03-04 ENCOUNTER — Emergency Department (HOSPITAL_COMMUNITY): Payer: Medicaid Other

## 2014-03-04 ENCOUNTER — Inpatient Hospital Stay (HOSPITAL_COMMUNITY)
Admission: EM | Admit: 2014-03-04 | Discharge: 2014-03-08 | DRG: 291 | Disposition: A | Discharge status: Home / Self Care | Payer: Medicaid Other | Attending: Internal Medicine | Admitting: Internal Medicine

## 2014-03-04 ENCOUNTER — Encounter (HOSPITAL_COMMUNITY): Payer: Medicaid Other

## 2014-03-04 ENCOUNTER — Observation Stay (HOSPITAL_COMMUNITY): Payer: Medicaid Other

## 2014-03-04 ENCOUNTER — Encounter (HOSPITAL_COMMUNITY): Payer: Self-pay | Admitting: Emergency Medicine

## 2014-03-04 DIAGNOSIS — I10 Essential (primary) hypertension: Secondary | ICD-10-CM

## 2014-03-04 DIAGNOSIS — I252 Old myocardial infarction: Secondary | ICD-10-CM

## 2014-03-04 DIAGNOSIS — Z9981 Dependence on supplemental oxygen: Secondary | ICD-10-CM

## 2014-03-04 DIAGNOSIS — F4311 Post-traumatic stress disorder, acute: Secondary | ICD-10-CM

## 2014-03-04 DIAGNOSIS — I255 Ischemic cardiomyopathy: Secondary | ICD-10-CM | POA: Diagnosis present

## 2014-03-04 DIAGNOSIS — J9621 Acute and chronic respiratory failure with hypoxia: Secondary | ICD-10-CM

## 2014-03-04 DIAGNOSIS — F419 Anxiety disorder, unspecified: Secondary | ICD-10-CM | POA: Diagnosis present

## 2014-03-04 DIAGNOSIS — E119 Type 2 diabetes mellitus without complications: Secondary | ICD-10-CM

## 2014-03-04 DIAGNOSIS — Z9111 Patient's noncompliance with dietary regimen: Secondary | ICD-10-CM | POA: Diagnosis present

## 2014-03-04 DIAGNOSIS — E669 Obesity, unspecified: Secondary | ICD-10-CM | POA: Diagnosis present

## 2014-03-04 DIAGNOSIS — G4730 Sleep apnea, unspecified: Secondary | ICD-10-CM | POA: Diagnosis present

## 2014-03-04 DIAGNOSIS — Z6841 Body Mass Index (BMI) 40.0 and over, adult: Secondary | ICD-10-CM

## 2014-03-04 DIAGNOSIS — E1140 Type 2 diabetes mellitus with diabetic neuropathy, unspecified: Secondary | ICD-10-CM | POA: Diagnosis present

## 2014-03-04 DIAGNOSIS — E780 Pure hypercholesterolemia: Secondary | ICD-10-CM | POA: Diagnosis present

## 2014-03-04 DIAGNOSIS — J9601 Acute respiratory failure with hypoxia: Secondary | ICD-10-CM | POA: Diagnosis present

## 2014-03-04 DIAGNOSIS — Z886 Allergy status to analgesic agent status: Secondary | ICD-10-CM

## 2014-03-04 DIAGNOSIS — Z86711 Personal history of pulmonary embolism: Secondary | ICD-10-CM

## 2014-03-04 DIAGNOSIS — I5023 Acute on chronic systolic (congestive) heart failure: Secondary | ICD-10-CM

## 2014-03-04 DIAGNOSIS — K219 Gastro-esophageal reflux disease without esophagitis: Secondary | ICD-10-CM | POA: Diagnosis present

## 2014-03-04 DIAGNOSIS — E1165 Type 2 diabetes mellitus with hyperglycemia: Secondary | ICD-10-CM | POA: Diagnosis present

## 2014-03-04 DIAGNOSIS — J45909 Unspecified asthma, uncomplicated: Secondary | ICD-10-CM | POA: Diagnosis present

## 2014-03-04 DIAGNOSIS — Z889 Allergy status to unspecified drugs, medicaments and biological substances status: Secondary | ICD-10-CM

## 2014-03-04 DIAGNOSIS — Z809 Family history of malignant neoplasm, unspecified: Secondary | ICD-10-CM

## 2014-03-04 DIAGNOSIS — Z955 Presence of coronary angioplasty implant and graft: Secondary | ICD-10-CM

## 2014-03-04 DIAGNOSIS — F3170 Bipolar disorder, currently in remission, most recent episode unspecified: Secondary | ICD-10-CM

## 2014-03-04 DIAGNOSIS — Z823 Family history of stroke: Secondary | ICD-10-CM

## 2014-03-04 DIAGNOSIS — I129 Hypertensive chronic kidney disease with stage 1 through stage 4 chronic kidney disease, or unspecified chronic kidney disease: Secondary | ICD-10-CM | POA: Diagnosis present

## 2014-03-04 DIAGNOSIS — F4310 Post-traumatic stress disorder, unspecified: Secondary | ICD-10-CM | POA: Diagnosis present

## 2014-03-04 DIAGNOSIS — R0600 Dyspnea, unspecified: Secondary | ICD-10-CM

## 2014-03-04 DIAGNOSIS — I2510 Atherosclerotic heart disease of native coronary artery without angina pectoris: Secondary | ICD-10-CM | POA: Diagnosis present

## 2014-03-04 DIAGNOSIS — F319 Bipolar disorder, unspecified: Secondary | ICD-10-CM | POA: Diagnosis present

## 2014-03-04 DIAGNOSIS — I2782 Chronic pulmonary embolism: Secondary | ICD-10-CM | POA: Diagnosis present

## 2014-03-04 DIAGNOSIS — J9622 Acute and chronic respiratory failure with hypercapnia: Secondary | ICD-10-CM | POA: Diagnosis present

## 2014-03-04 DIAGNOSIS — Z9114 Patient's other noncompliance with medication regimen: Secondary | ICD-10-CM | POA: Diagnosis present

## 2014-03-04 DIAGNOSIS — Z7982 Long term (current) use of aspirin: Secondary | ICD-10-CM

## 2014-03-04 DIAGNOSIS — Z794 Long term (current) use of insulin: Secondary | ICD-10-CM

## 2014-03-04 DIAGNOSIS — I5043 Acute on chronic combined systolic (congestive) and diastolic (congestive) heart failure: Principal | ICD-10-CM | POA: Diagnosis present

## 2014-03-04 DIAGNOSIS — E785 Hyperlipidemia, unspecified: Secondary | ICD-10-CM | POA: Diagnosis present

## 2014-03-04 DIAGNOSIS — I5042 Chronic combined systolic (congestive) and diastolic (congestive) heart failure: Secondary | ICD-10-CM

## 2014-03-04 DIAGNOSIS — E872 Acidosis: Secondary | ICD-10-CM | POA: Diagnosis present

## 2014-03-04 DIAGNOSIS — F17210 Nicotine dependence, cigarettes, uncomplicated: Secondary | ICD-10-CM | POA: Diagnosis present

## 2014-03-04 DIAGNOSIS — Z720 Tobacco use: Secondary | ICD-10-CM

## 2014-03-04 DIAGNOSIS — J441 Chronic obstructive pulmonary disease with (acute) exacerbation: Secondary | ICD-10-CM

## 2014-03-04 LAB — CBC WITH DIFFERENTIAL/PLATELET
Basophils Absolute: 0 10*3/uL (ref 0.0–0.1)
Basophils Relative: 0 % (ref 0–1)
Eosinophils Absolute: 0.1 10*3/uL (ref 0.0–0.7)
Eosinophils Relative: 1 % (ref 0–5)
HCT: 32 % — ABNORMAL LOW (ref 36.0–46.0)
Hemoglobin: 10 g/dL — ABNORMAL LOW (ref 12.0–15.0)
Lymphocytes Relative: 28 % (ref 12–46)
Lymphs Abs: 2.6 10*3/uL (ref 0.7–4.0)
MCH: 25.1 pg — ABNORMAL LOW (ref 26.0–34.0)
MCHC: 31.3 g/dL (ref 30.0–36.0)
MCV: 80.4 fL (ref 78.0–100.0)
Monocytes Absolute: 0.8 10*3/uL (ref 0.1–1.0)
Monocytes Relative: 9 % (ref 3–12)
Neutro Abs: 5.7 10*3/uL (ref 1.7–7.7)
Neutrophils Relative %: 62 % (ref 43–77)
Platelets: 270 10*3/uL (ref 150–400)
RBC: 3.98 MIL/uL (ref 3.87–5.11)
RDW: 18.1 % — ABNORMAL HIGH (ref 11.5–15.5)
WBC: 9.2 10*3/uL (ref 4.0–10.5)

## 2014-03-04 LAB — CBC
HEMATOCRIT: 32 % — AB (ref 36.0–46.0)
Hemoglobin: 10 g/dL — ABNORMAL LOW (ref 12.0–15.0)
MCH: 24.4 pg — ABNORMAL LOW (ref 26.0–34.0)
MCHC: 31.3 g/dL (ref 30.0–36.0)
MCV: 78.2 fL (ref 78.0–100.0)
Platelets: 268 10*3/uL (ref 150–400)
RBC: 4.09 MIL/uL (ref 3.87–5.11)
RDW: 17.9 % — ABNORMAL HIGH (ref 11.5–15.5)
WBC: 9.2 10*3/uL (ref 4.0–10.5)

## 2014-03-04 LAB — I-STAT ARTERIAL BLOOD GAS, ED
Acid-Base Excess: 5 mmol/L — ABNORMAL HIGH (ref 0.0–2.0)
Bicarbonate: 32.2 mEq/L — ABNORMAL HIGH (ref 20.0–24.0)
O2 Saturation: 70 %
Patient temperature: 100.3
TCO2: 34 mmol/L (ref 0–100)
pCO2 arterial: 60.5 mmHg (ref 35.0–45.0)
pH, Arterial: 7.338 — ABNORMAL LOW (ref 7.350–7.450)
pO2, Arterial: 42 mmHg — ABNORMAL LOW (ref 80.0–100.0)

## 2014-03-04 LAB — BASIC METABOLIC PANEL
ANION GAP: 8 (ref 5–15)
BUN: 11 mg/dL (ref 6–23)
CALCIUM: 8.7 mg/dL (ref 8.4–10.5)
CO2: 30 meq/L (ref 19–32)
CREATININE: 0.83 mg/dL (ref 0.50–1.10)
Chloride: 100 mEq/L (ref 96–112)
GFR calc Af Amer: >90 mL/min (ref >90)
GFR, EST NON AFRICAN AMERICAN: 79 mL/min — AB (ref >90)
Glucose, Bld: 144 mg/dL — ABNORMAL HIGH (ref 70–99)
Potassium: 4.1 mEq/L (ref 3.7–5.3)
Sodium: 138 mEq/L (ref 137–147)

## 2014-03-04 LAB — CREATININE, SERUM
CREATININE: 0.76 mg/dL (ref 0.50–1.10)
GFR calc Af Amer: >90 mL/min (ref >90)
GFR calc non Af Amer: >90 mL/min (ref >90)

## 2014-03-04 LAB — PRO B NATRIURETIC PEPTIDE: Pro B Natriuretic peptide (BNP): 969.3 pg/mL — ABNORMAL HIGH (ref 0–125)

## 2014-03-04 LAB — I-STAT TROPONIN, ED: Troponin i, poc: 0.01 ng/mL (ref 0.00–0.08)

## 2014-03-04 LAB — LACTIC ACID, PLASMA: LACTIC ACID, VENOUS: 0.5 mmol/L (ref 0.5–2.2)

## 2014-03-04 LAB — TROPONIN I

## 2014-03-04 LAB — TSH: TSH: 1.05 u[IU]/mL (ref 0.350–4.500)

## 2014-03-04 IMAGING — CT CT ANGIO CHEST
1 of 8 series · 17 of 36 positions shown · IV contrast (Iodine)
Comparison: Chest radiographs obtained earlier today. Chest CTA
dated 02/01/2014.

CLINICAL DATA: Shortness of breath.  COPD exacerbation.  On oxygen.

EXAM:
CT ANGIOGRAPHY CHEST WITH CONTRAST
TECHNIQUE: Multidetector CT imaging of the chest was performed using the
standard protocol during bolus administration of intravenous
contrast. Multiplanar CT image reconstructions and MIPs were
obtained to evaluate the vascular anatomy.
CONTRAST:  100mL OMNIPAQUE IOHEXOL 350 MG/ML SOLN

[Series 407: thins pacs · axial · 0.78mm/px · z∈[-146,+104]mm · 17 of 282 slices shown]
[im 16/282  lung]
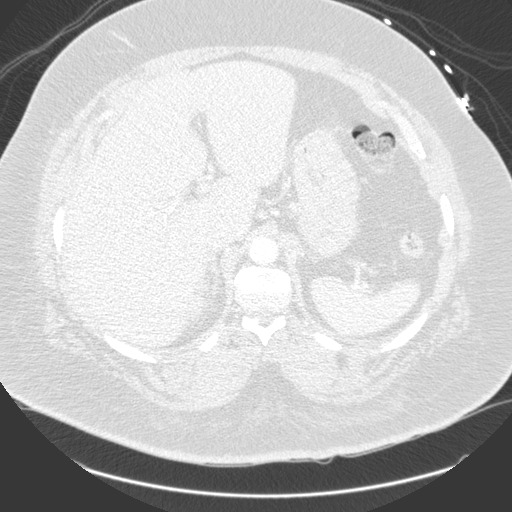
[im 32/282  mediastinal]
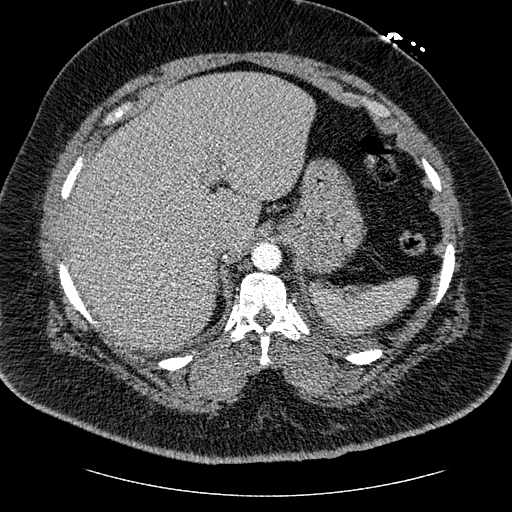
[im 47/282  lung]
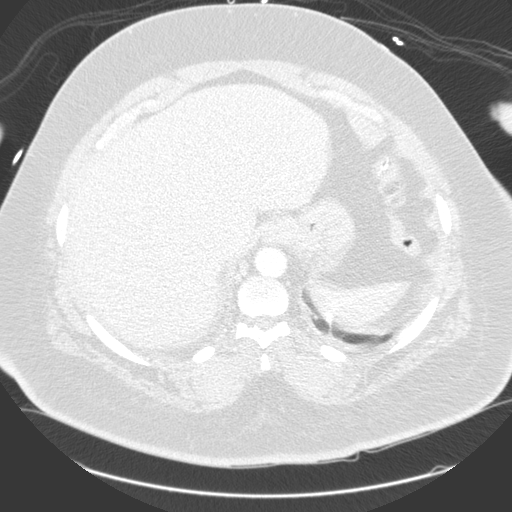
[im 63/282  mediastinal]
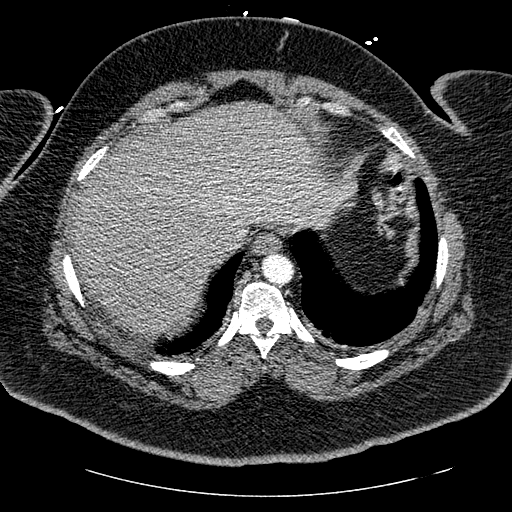
[im 79/282  lung]
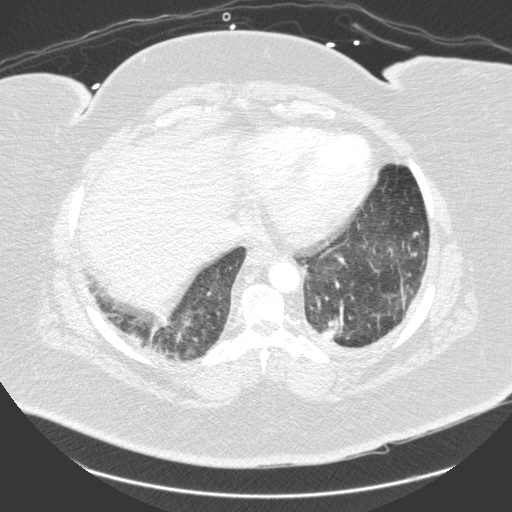
[im 94/282  mediastinal]
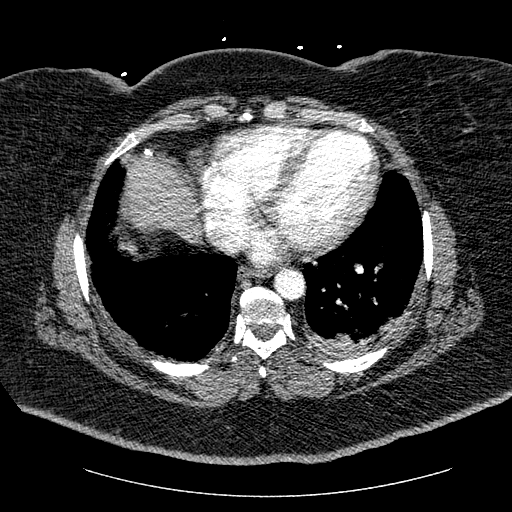
[im 110/282  lung]
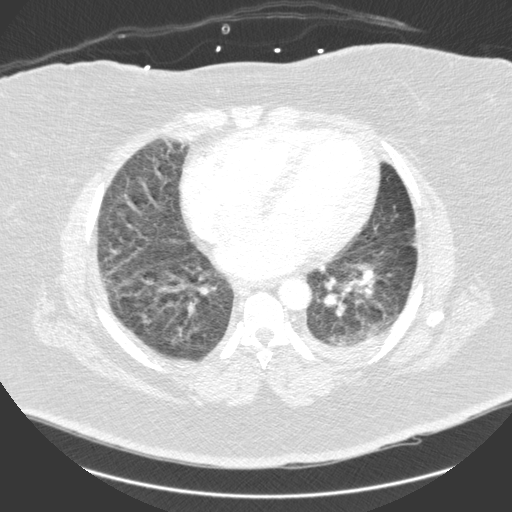
[im 125/282  mediastinal]
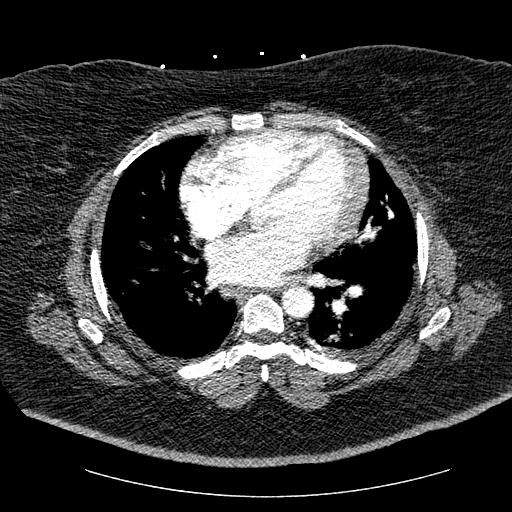
[im 141/282  lung]
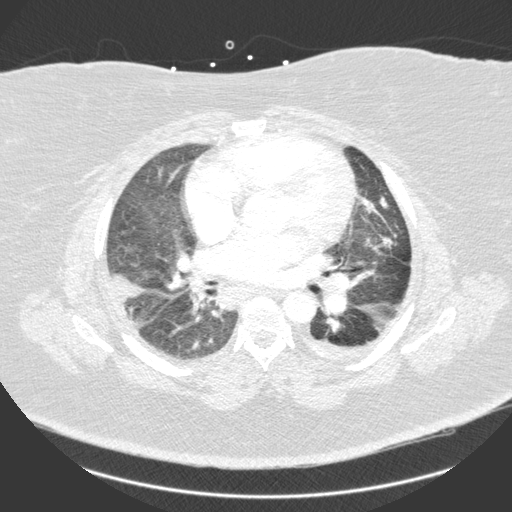
[im 157/282  mediastinal]
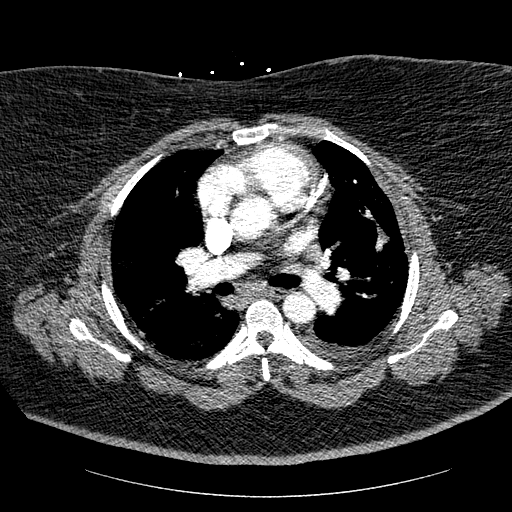
[im 172/282  lung]
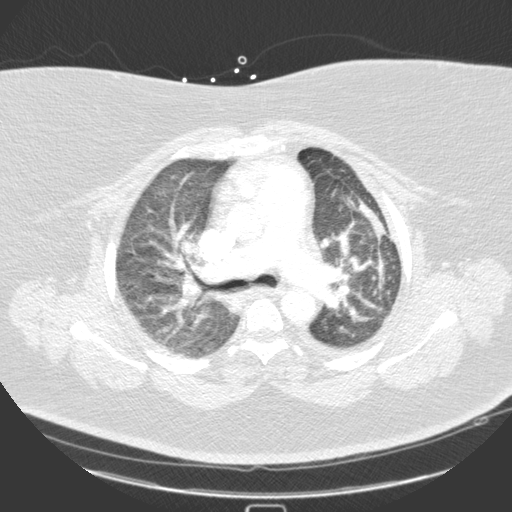
[im 188/282  mediastinal]
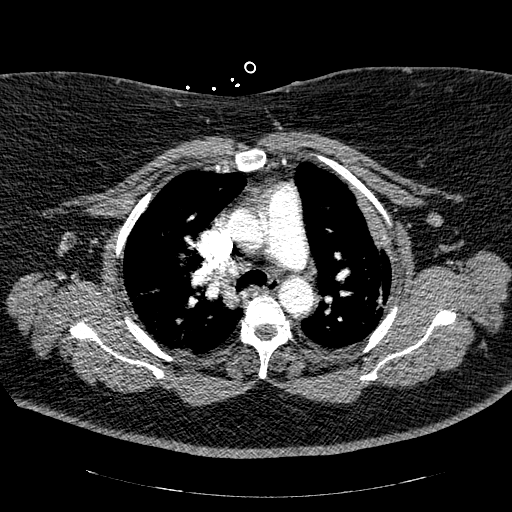
[im 203/282  lung]
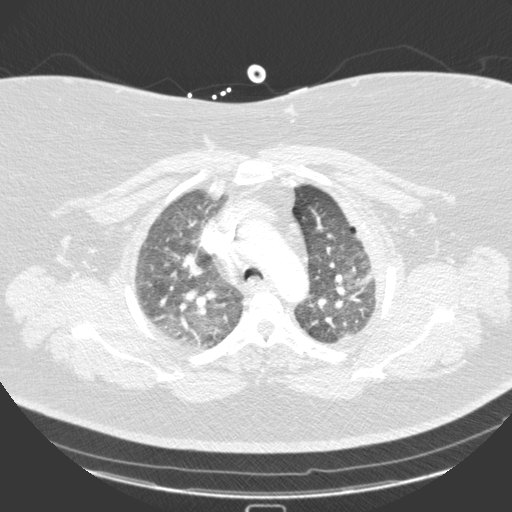
[im 219/282  mediastinal]
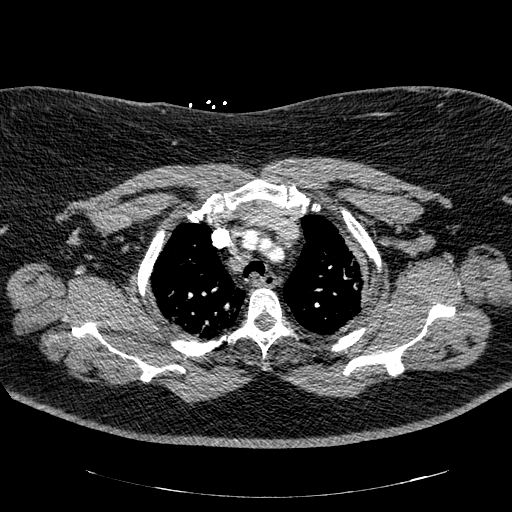
[im 235/282  lung]
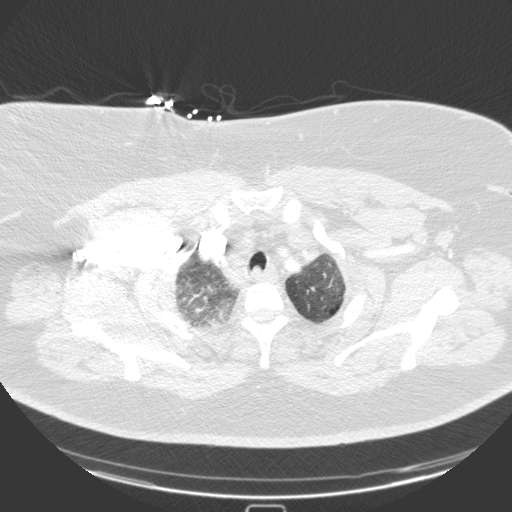
[im 250/282  mediastinal]
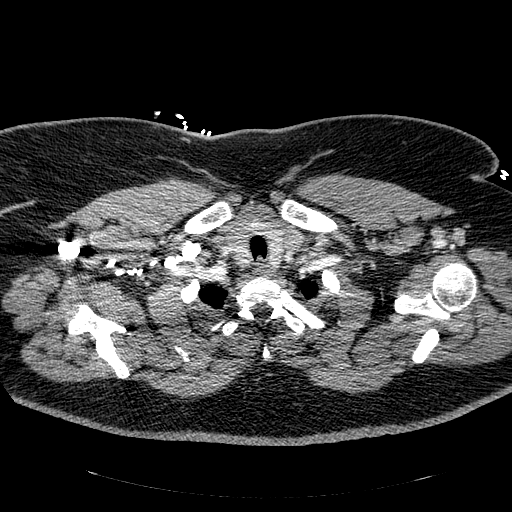
[im 266/282  lung]
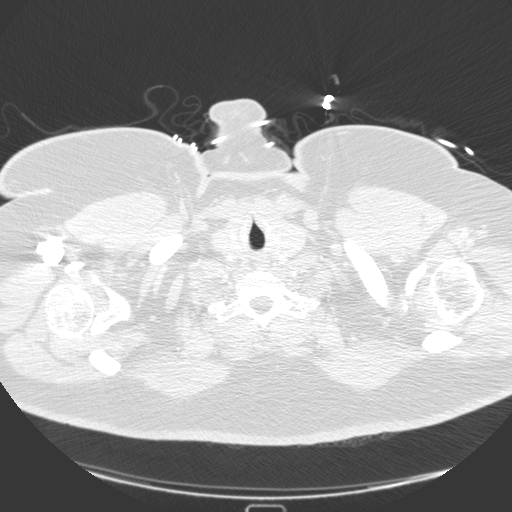

[17 of 36 positions shown; findings below may reference images not displayed]

FINDINGS: Increased prominence of the pulmonary vasculature and mild increase
in prominence of the interstitial markings. Areas of subsegmental
atelectasis and scarring bilaterally, with little overall change.
Mild left posterior pleural thickening and calcification are
unchanged. No lung nodules or enlarged lymph nodes. Interval small
amount of loculated fluid in the lateral aspect of the minor fissure
on the right. Mild bilateral bullous changes. Previously noted
occlusion of the main right lower lobe pulmonary artery. Otherwise,
normally opacified pulmonary arteries with no visible filling
defects. Mild thoracic spine degenerative changes. Unremarkable
upper abdomen.

Review of the MIP images confirms the above findings.
IMPRESSION: 1. Mild changes of congestive heart failure superimposed on COPD.
2. No acute pulmonary emboli seen.
3. Stable chronic right lower lobe pulmonary artery occlusion, most
likely due to previous pulmonary embolic disease.
4. Mildly increased bilateral atelectasis with stable underlying
scarring.

## 2014-03-04 MED ORDER — LEVOFLOXACIN IN D5W 750 MG/150ML IV SOLN
750.0000 mg | INTRAVENOUS | Status: DC
  Administered 2014-03-04 – 2014-03-07 (×4): 750 mg via INTRAVENOUS
  Filled 2014-03-04 (×7): qty 150

## 2014-03-04 MED ORDER — HEPARIN SODIUM (PORCINE) 5000 UNIT/ML IJ SOLN
5000.0000 [IU] | Freq: Three times a day (TID) | INTRAMUSCULAR | Status: DC
  Administered 2014-03-05 – 2014-03-08 (×11): 5000 [IU] via SUBCUTANEOUS
  Filled 2014-03-04 (×12): qty 1

## 2014-03-04 MED ORDER — IPRATROPIUM-ALBUTEROL 0.5-2.5 (3) MG/3ML IN SOLN
3.0000 mL | RESPIRATORY_TRACT | Status: DC | PRN
  Administered 2014-03-05: 3 mL via RESPIRATORY_TRACT
  Filled 2014-03-04: qty 3

## 2014-03-04 MED ORDER — SODIUM CHLORIDE 0.9 % IJ SOLN
3.0000 mL | Freq: Two times a day (BID) | INTRAMUSCULAR | Status: DC
  Administered 2014-03-05: 3 mL via INTRAVENOUS

## 2014-03-04 MED ORDER — SODIUM CHLORIDE 0.9 % IJ SOLN: 3.0000 mL | INTRAMUSCULAR | Status: DC | PRN

## 2014-03-04 MED ORDER — PALIPERIDONE PALMITATE 117 MG/0.75ML IM SUSP: 156.0000 mg | INTRAMUSCULAR | Status: DC

## 2014-03-04 MED ORDER — PRAVASTATIN SODIUM 40 MG PO TABS
40.0000 mg | ORAL_TABLET | Freq: Every evening | ORAL | Status: DC
  Administered 2014-03-05 (×2): 40 mg via ORAL
  Filled 2014-03-04 (×2): qty 1

## 2014-03-04 MED ORDER — METHYLPREDNISOLONE SODIUM SUCC 125 MG IJ SOLR
125.0000 mg | Freq: Two times a day (BID) | INTRAMUSCULAR | Status: DC
  Administered 2014-03-05 (×3): 125 mg via INTRAVENOUS
  Filled 2014-03-04 (×5): qty 2

## 2014-03-04 MED ORDER — ASPIRIN EC 81 MG PO TBEC
81.0000 mg | DELAYED_RELEASE_TABLET | Freq: Every day | ORAL | Status: DC
  Administered 2014-03-05 – 2014-03-08 (×4): 81 mg via ORAL
  Filled 2014-03-04 (×4): qty 1

## 2014-03-04 MED ORDER — INSULIN ASPART 100 UNIT/ML ~~LOC~~ SOLN
0.0000 [IU] | SUBCUTANEOUS | Status: DC
  Administered 2014-03-05 (×3): 5 [IU] via SUBCUTANEOUS

## 2014-03-04 MED ORDER — PANTOPRAZOLE SODIUM 40 MG PO TBEC
80.0000 mg | DELAYED_RELEASE_TABLET | Freq: Every day | ORAL | Status: DC
  Administered 2014-03-05 – 2014-03-08 (×4): 80 mg via ORAL
  Filled 2014-03-04 (×4): qty 2

## 2014-03-04 MED ORDER — SODIUM CHLORIDE 0.9 % IV SOLN
250.0000 mL | INTRAVENOUS | Status: DC | PRN
  Administered 2014-03-05: 250 mL via INTRAVENOUS

## 2014-03-04 MED ORDER — NITROGLYCERIN 0.4 MG SL SUBL: 0.4000 mg | SUBLINGUAL_TABLET | SUBLINGUAL | Status: DC | PRN

## 2014-03-04 MED ORDER — LISINOPRIL 10 MG PO TABS
10.0000 mg | ORAL_TABLET | Freq: Every day | ORAL | Status: DC
  Administered 2014-03-05 – 2014-03-08 (×4): 10 mg via ORAL
  Filled 2014-03-04 (×4): qty 1

## 2014-03-04 MED ORDER — IOHEXOL 350 MG/ML SOLN
100.0000 mL | Freq: Once | INTRAVENOUS | Status: AC | PRN
  Administered 2014-03-04: 100 mL via INTRAVENOUS

## 2014-03-04 MED ORDER — INSULIN DETEMIR 100 UNIT/ML ~~LOC~~ SOLN
35.0000 [IU] | Freq: Every day | SUBCUTANEOUS | Status: DC
  Administered 2014-03-05: 35 [IU] via SUBCUTANEOUS
  Filled 2014-03-04 (×2): qty 0.35

## 2014-03-04 MED ORDER — IPRATROPIUM-ALBUTEROL 0.5-2.5 (3) MG/3ML IN SOLN
3.0000 mL | RESPIRATORY_TRACT | Status: AC
  Administered 2014-03-04 (×2): 3 mL via RESPIRATORY_TRACT
  Filled 2014-03-04: qty 6

## 2014-03-04 MED ORDER — IPRATROPIUM-ALBUTEROL 0.5-2.5 (3) MG/3ML IN SOLN
3.0000 mL | RESPIRATORY_TRACT | Status: DC
  Administered 2014-03-05: 3 mL via RESPIRATORY_TRACT
  Filled 2014-03-04: qty 3

## 2014-03-04 MED ORDER — PRASUGREL HCL 10 MG PO TABS
10.0000 mg | ORAL_TABLET | Freq: Every day | ORAL | Status: DC
  Administered 2014-03-05 – 2014-03-08 (×4): 10 mg via ORAL
  Filled 2014-03-04 (×5): qty 1

## 2014-03-04 MED ORDER — SODIUM CHLORIDE 0.9 % IJ SOLN
3.0000 mL | Freq: Two times a day (BID) | INTRAMUSCULAR | Status: DC
  Administered 2014-03-05 – 2014-03-06 (×2): 3 mL via INTRAVENOUS

## 2014-03-04 MED ORDER — METHYLPREDNISOLONE SODIUM SUCC 125 MG IJ SOLR
125.0000 mg | Freq: Once | INTRAMUSCULAR | Status: AC
  Administered 2014-03-04: 125 mg via INTRAVENOUS
  Filled 2014-03-04: qty 2

## 2014-03-04 MED ORDER — CYCLOSPORINE 0.05 % OP EMUL
2.0000 [drp] | Freq: Two times a day (BID) | OPHTHALMIC | Status: DC | PRN
  Filled 2014-03-04: qty 1

## 2014-03-04 MED ORDER — DIAZEPAM 5 MG PO TABS
5.0000 mg | ORAL_TABLET | Freq: Three times a day (TID) | ORAL | Status: DC | PRN
  Administered 2014-03-06: 5 mg via ORAL
  Filled 2014-03-04: qty 1

## 2014-03-04 MED ORDER — FUROSEMIDE 10 MG/ML IJ SOLN
40.0000 mg | Freq: Once | INTRAMUSCULAR | Status: AC
  Administered 2014-03-04: 40 mg via INTRAVENOUS
  Filled 2014-03-04: qty 4

## 2014-03-04 MED ORDER — ALBUTEROL (5 MG/ML) CONTINUOUS INHALATION SOLN
10.0000 mg/h | INHALATION_SOLUTION | Freq: Once | RESPIRATORY_TRACT | Status: AC
  Administered 2014-03-04: 10 mg/h via RESPIRATORY_TRACT
  Filled 2014-03-04: qty 20

## 2014-03-04 MED ORDER — CARVEDILOL 12.5 MG PO TABS
12.5000 mg | ORAL_TABLET | Freq: Two times a day (BID) | ORAL | Status: DC
  Administered 2014-03-05 – 2014-03-08 (×7): 12.5 mg via ORAL
  Filled 2014-03-04 (×9): qty 1

## 2014-03-04 NOTE — Progress Notes (Signed)
ABG was drawn but resulted as a venous sample. Results given to Dr. Zenia Resides ok'd results and did not order redraw.

## 2014-03-04 NOTE — ED Notes (Signed)
To ED via POV for eval of increasing SOB. Pt states she is on 40mg  PO Lasix twice a day. On arrival to triage pt speaking in broken sentences. Pt is on 2L O2 OTC. Once place on our O2 at [facility] pt able to speak sentences. Taken straight to TR A for treatment

## 2014-03-04 NOTE — Progress Notes (Signed)
ED CM noted patient with 4 ED visits and 3 hospital admission for related issues. Patient presented to [facility] ED with  SOB on 2L home O2.[name] Kitchen Hx of COPD exacerbation, DM, CHF, PCP M. Edwards. Medicaid payor source. Met with patient at bedside regarding f/u with Pulmonologist. Patient states that she has not made appoint with OP Pulmonologist, offered assistance scheduling f/u appt. Patient verbalized appreciation for the assistance. Patient prefers to go to [place] office.  ED CM will contact the office to schedule f/u with Pulmonary in the am. Patient made aware, teach back done. Verified patient contact information. ED CM will f/u with patient.

## 2014-03-04 NOTE — ED Notes (Signed)
Pt to CT

## 2014-03-04 NOTE — ED Notes (Signed)
RT notified on need for continuous neb.

## 2014-03-04 NOTE — ED Provider Notes (Addendum)
I saw and evaluated the patient, reviewed the resident's note and I agree with the findings and plan.   EKG Interpretation   Date/Time:  Monday March 04 2014 18:33:35 EDT Ventricular Rate:  99 PR Interval:  153 QRS Duration: 76 QT Interval:  401 QTC Calculation: 515 R Axis:   28 Text Interpretation:  Sinus rhythm Inferior infarct, old Anterior infarct,  old Prolonged QT interval No significant change since last tracing  Confirmed by Selma Rodelo  MD, Vani Gunner ([id number]) on 03/04/2014 7:26:01 PM     Patient here 2 days of progressive shortness of breath and dyspnea on exertion. Cough productive of white sputum. . Low-grade temperature as well. Lung exam with diffuse rhonchi as well as expiratory wheezing. Awaiting for chest x-ray. I was available for immediate consultation when the angiocath was inserted. Suspect the patient has pneumonia versus CHF. Require admission  Leota Jacobsen, MD 03/04/14 1959  Leota Jacobsen, MD 03/22/14 [id number]

## 2014-03-04 NOTE — H&P (Addendum)
Hospitalist Admission History and Physical  Patient name: Tamara Silva Medical record number: [id number] Date of birth: 1960/10/10 Age: 53 y.o. Gender: female  Primary Care Provider: Kerin Perna, NP  Chief Complaint: acute resp failure with hypoxia  History of Present Illness:This is a 53 y.o. year old female with significant past medical history of multiple medical problems including morbid obesity, chronic resp failure on 2L, COPD, chronic mixed CHF, coronary artery disease status post stenting, IDDM, hx/o PE  presenting with acute resp failure with hypoxia. Patient reports 2 days of progressive increased worker breathing, cough, wheezing, positive sputum production. Has been using increased amounts of albuterol at home. Positive mild subjective fevers and chills at home. Denies any chest pain. No abdominal pain. Denies any worsening lower extremity edema. Stasis she has been compliant with her CHF regimen. Denies any heavy salt or NSAID intake. Has had some running nose nasal congestion. Presented to the ER with MAXIMUM TEMPERATURE 100.3, heart rate in the 80s to 100s, respirations and 20s to 30s, blood pressure in the 120s to 170s over 60s to 90s. Satting greater than 94% on 3 L nasal cannula. What also, 0.2, hemoglobin 10, creatinine 0.83. Pro BNP 970 (baseline around 400). Troponin negative x1. Lactate 0.5. ABG indicative of a mildly decompensated chronic respiratory acidosis. Chest x-ray shows new small amount of fluid within the minor fissure. Otherwise stable chest x-ray. CT angiogram is pending. This will be her third CT angiogram this year. EKG was sinus rhythm. Heart rate 99. QTC mildly elevated at 515 milliseconds. Start on IV Solu-Medrol and IV Levaquin in the ER. Status post 40 mg of Lasix IV x1.   Filed Weights    02/07/14 0400  02/07/14 1023  02/08/14 0534   Weight:  143.6 kg (316 lb 9.3 oz)  141.522 kg (312 lb)  141.295 kg (311 lb 8 oz)    Weight Today: pending    Assessment and Plan: [name] is a 53 y.o. year old female presenting with Acute resp failure with hypoxia   Active Problems:   COPD (chronic obstructive pulmonary disease)   Acute respiratory failure with hypoxia  1- Acute resp failure with hypoxia  -Likely multifactorial with contributions of COPD, chronic mixed CHF, body habitus -no infiltrate on imaging  -repeat CT Angio pending  -Continue IV Levaquin IV Solu-Medrol -Schedule duo nebs -IV Lasix-watch urine output closely -weight patient-will need to trend -Strict ins and outs  2- Chronic mixed CHF  -Most recent 2-D echo 5 2015 with EF of 35-40% and grade 2 diastolic dysfunction. -Myoview April 2015 with low risk Myoview with severe LV dysfunction and no evidence of ischemia -Repeat 2-D echo given recurrent admissions for above -mildly elevated QTc on EKG-follow in setting of levaquin use -tele bed -Weight patient -Strict ins and outs -IV Lasix -Trend urine output  3-CAD s/p stenting/HTN -no active CP -cont home regimen -cycle CEs  -telebed   4-IDDM -SSI, A1C -home long acting insulin -may need to uptitrate in setting of steroid use   5-Psych  -mood stable  -cont home regimen    FEN/GI: NPO for now. Resume heart healthy-carb modified diet pending resolution of above  Prophylaxis: sub q heparin (noted hx/PE-most recent CTA negative-repeat CTA pending-ordered by ED resident) Disposition: pending further evaluation  Code Status:Full Code    Patient Active Problem List   Diagnosis Date Noted  . Acute respiratory failure with hypoxia 03/04/2014  . Hypoxia 02/07/2014  . CKD (chronic kidney disease), stage II 02/07/2014  .  Acute and chronic respiratory failure 02/06/2014  . COPD exacerbation 02/01/2014  . ST elevation myocardial infarction (STEMI) of inferior wall, initial episode of care 10/07/2013  . CAD S/P percutaneous coronary angioplasty - prior PCI to LAD; RCA PCI: new Xience Alpine DES 2.75 mm x  15 mm  10/07/2013  . Normocytic anemia 10/07/2013  . Type 2 diabetes mellitus treated with insulin 08/02/2013  . Morbid obesity 07/30/2013  . Gastroparesis 07/28/2013  . Hx of pulmonary embolus 07/28/2013  . Chronic combined systolic and diastolic congestive heart failure, NYHA class 2; EF 30-35% 07/10/2013  . COPD (chronic obstructive pulmonary disease) 07/01/2013  . Chronic combined systolic and diastolic heart failure, NYHA class 1 07/01/2013  . Chronic pulmonary embolism 02/08/2013  . Tobacco abuse 02/08/2013  . Bipolar disorder 02/08/2013  . Acute posttraumatic stress syndrome 02/08/2013  . Essential hypertension 02/08/2013  . Hyperlipidemia 02/08/2013  . Cardiomyopathy, ischemic 02/08/2013  . Chronic anticoagulation 11/27/2012   Past Medical History: Past Medical History  Diagnosis Date  . Diabetes mellitus without complication   . PE (pulmonary embolism)   . Hypercholesterolemia   . Hypertension   . COPD (chronic obstructive pulmonary disease)   . Asthma   . Coronary artery disease   . CHF (congestive heart failure)   . Mental disorder     BIPOLAR  . Shortness of breath   . GERD (gastroesophageal reflux disease)   . Neuromuscular disorder     DIABETIC NEUROPATHY  . Ischemic cardiomyopathy   . Bipolar disorder   . Post-traumatic stress syndrome   . On home oxygen therapy     "2L most all the time" (02/06/2014)    Past Surgical History: Past Surgical History  Procedure Laterality Date  . Coronary angioplasty with stent placement      CAD in 2006 x 2 and 2009 2 more- place din REx in [place] [facility]  . Esophagogastroduodenoscopy (egd) with propofol N/A 08/03/2013    Procedure: ESOPHAGOGASTRODUODENOSCOPY (EGD) WITH PROPOFOL;  Surgeon: Jeryl Columbia, MD;  Location: [facility];  Service: Endoscopy;  Laterality: N/A;  . Colonoscopy with propofol N/A 08/03/2013    Procedure: COLONOSCOPY WITH PROPOFOL;  Surgeon: Jeryl Columbia, MD;  Location: [facility];  Service:  Endoscopy;  Laterality: N/A;  . Coronary angioplasty with stent placement  10/07/2013    Xience Alpine DES 2.75  mm x 15  mm    Social History: History   Social History  . Marital Status: Single    Spouse Name: N/A    Number of Children: N/A  . Years of Education: N/A   Social History Main Topics  . Smoking status: Former Smoker    Types: Cigarettes    Quit date: 11/24/2012  . Smokeless tobacco: Never Used  . Alcohol Use: No  . Drug Use: No  . Sexual Activity: No   Other Topics Concern  . None   Social History Narrative   Origibnally from [place]   Most recently from [place]   Daughter lives in town      On Social security to CHF, COPD, CAD    Family History: Family History  Problem Relation Age of Onset  . Stroke Mother   . Cancer Father     Allergies: Allergies  Allergen Reactions  . Metolazone Other (See Comments)    Dizziness and falling  . Plavix [Clopidogrel Bisulfate] Other (See Comments)    High PRU's - non-responder  . Nsaids Other (See Comments)    "I do  not take NSAIDs d/t interfering with other meds"    Current Facility-Administered Medications  Medication Dose Route Frequency Provider Last Rate Last Dose  . 0.9 %  sodium chloride infusion  250 mL Intravenous PRN Shanda Howells, MD      . heparin injection 5,000 Units  5,000 Units Subcutaneous 3 times per day Shanda Howells, MD      . levofloxacin [facility]) IVPB 750 mg  750 mg Intravenous Q24H Gustavus Bryant, MD 100 mL/hr at 03/04/14 2051 750 mg at 03/04/14 2051  . sodium chloride 0.9 % injection 3 mL  3 mL Intravenous Q12H Shanda Howells, MD      . sodium chloride 0.9 % injection 3 mL  3 mL Intravenous Q12H Shanda Howells, MD      . sodium chloride 0.9 % injection 3 mL  3 mL Intravenous PRN Shanda Howells, MD       Current Outpatient Prescriptions  Medication Sig Dispense Refill  . albuterol (PROVENTIL HFA;VENTOLIN HFA) 108 (90 BASE) MCG/ACT inhaler Inhale 2 puffs into the lungs every 6 (six)  hours as needed for wheezing or shortness of breath.       [name] Kitchen albuterol (PROVENTIL) (2.5 MG/3ML) 0.083% nebulizer solution Take 6 mLs (5 mg total) by nebulization every 4 (four) hours as needed for wheezing or shortness of breath.  75 mL  12  . aspirin 81 MG tablet Take 1 tablet (81 mg total) by mouth daily.      . benzonatate (TESSALON) 100 MG capsule Take 1 capsule (100 mg total) by mouth 3 (three) times daily as needed for cough.  20 capsule  0  . carvedilol (COREG) 25 MG tablet Take 12.5 mg by mouth 2 (two) times daily with a meal.       . cycloSPORINE (RESTASIS) 0.05 % ophthalmic emulsion Place 2 drops into both eyes 2 (two) times daily as needed (for dry eyes).       . diazepam (VALIUM) 10 MG tablet Take 0.5 tablets (5 mg total) by mouth every 8 (eight) hours as needed for anxiety.  30 tablet  0  . esomeprazole (NEXIUM) 40 MG capsule Take 40 mg by mouth 2 (two) times daily.      . fluticasone (FLONASE) 50 MCG/ACT nasal spray Place 2 sprays into both nostrils 2 (two) times daily.       . furosemide (LASIX) 40 MG tablet Take 2 tablets (80 mg total) by mouth 2 (two) times daily.  120 tablet  11  . Glucose Blood (ACCU-CHEK ADVANTAGE TEST VI) 1 each by Other route See admin instructions. Check blood sugar 4 times daily.      . insulin aspart (NOVOLOG) 100 UNIT/ML injection Inject 15-20 Units into the skin 3 (three) times daily before meals. Sliding scale      . insulin detemir (LEVEMIR) 100 UNIT/ML injection Inject 35 Units into the skin at bedtime.       [name] Kitchen levofloxacin (LEVAQUIN) 500 MG tablet Take 1 tablet (500 mg total) by mouth daily.  5 tablet  0  . lisinopril (PRINIVIL,ZESTRIL) 10 MG tablet Take 1 tablet (10 mg total) by mouth daily.  30 tablet  11  . nitroGLYCERIN (NITROSTAT) 0.4 MG SL tablet Place 1 tablet (0.4 mg total) under the tongue every 5 (five) minutes as needed for chest pain.  100 tablet  0  . omeprazole (PRILOSEC) 40 MG capsule Take 40 mg by mouth 2 (two) times daily.      .  Paliperidone Palmitate (INVEGA SUSTENNA) 117  MG/0.75ML SUSP Inject 156 mg into the muscle every 30 (thirty) days. One injection monthly. On the 16th of every month      . potassium chloride (K-DUR) 10 MEQ tablet Take 10 mEq by mouth 2 (two) times daily.       . prasugrel (EFFIENT) 10 MG TABS tablet Take 1 tablet (10 mg total) by mouth daily.  30 tablet  11  . pravastatin (PRAVACHOL) 40 MG tablet Take 40 mg by mouth every evening.       . predniSONE (STERAPRED UNI-PAK) 10 MG tablet Take 6-5-4-3-2-1 tablets by mouth daily till gone.  21 tablet  0   Review Of Systems: 12 point ROS negative except as noted above in HPI.  Physical Exam: Filed Vitals:   03/04/14 2130  BP: 131/77  Pulse: 92  Temp:   Resp: 27    General: cooperative and morbidly obese HEENT: PERRLA and extra ocular movement intact Heart: S1, S2 normal, no murmur, rub or gallop, regular rate and rhythm Lungs: expiratory wheezes and rhonchi throughout both lung fields and mildly increased WOB  Abdomen: obses abdomen, non tender, + bowel sounds  Extremities: 2+ peripheral pulses, 2+ edema bilaterally  Skin:no rashes Neurology: normal without focal findings  Labs and Imaging: Lab Results  Component Value Date/Time   NA 138 03/04/2014  6:30 PM   K 4.1 03/04/2014  6:30 PM   CL 100 03/04/2014  6:30 PM   CO2 30 03/04/2014  6:30 PM   BUN 11 03/04/2014  6:30 PM   CREATININE 0.83 03/04/2014  6:30 PM   GLUCOSE 144* 03/04/2014  6:30 PM   Lab Results  Component Value Date   WBC 9.2 03/04/2014   HGB 10.0* 03/04/2014   HCT 32.0* 03/04/2014   MCV 80.4 03/04/2014   PLT 270 03/04/2014    Dg Chest 2 View  03/04/2014   CLINICAL DATA:  Acute shortness of breath for 2 days. History of hypertension, asthma, diabetes and congestive heart failure. Initial encounter.  EXAM: CHEST  2 VIEW  COMPARISON:  Radiographs 02/06/2014.  CT 02/01/2014.  FINDINGS: The heart size and mediastinal contours are stable. There is stable chronic lung disease with  linear scarring in the left upper lobe. There is new horizontal density within the right minor fissure which likely represents a small amount of loculated pleural fluid. No acute osseous findings are evident.  IMPRESSION: New small amount of fluid within the minor fissure. Otherwise stable chest with mild chronic lung disease.   Electronically Signed   By: Camie Patience M.D.   On: 03/04/2014 20:09           Shanda Howells MD  Pager: [phone]

## 2014-03-04 NOTE — ED Notes (Signed)
Attempted IV x1 without success. Resident MD at bedside for [place] guided IV attempt

## 2014-03-04 NOTE — ED Notes (Signed)
Pt bed reassigned to step-down

## 2014-03-04 NOTE — ED Provider Notes (Signed)
CSN: [id number]     Arrival date & time 03/04/14  1731 History   First MD Initiated Contact with Patient 03/04/14 1736     Chief Complaint  Patient presents with  . Shortness of Breath   Patient is a 53 y.o. female presenting with shortness of breath. The history is provided by the patient.  Shortness of Breath Severity:  Severe Onset quality:  Gradual Duration:  2 days Timing:  Constant Progression:  Worsening Chronicity:  Recurrent Relieved by:  Position changes Ineffective treatments:  Diuretics and oxygen Associated symptoms: cough   Associated symptoms: no abdominal pain, no chest pain, no fever, no headaches, no neck pain, no rash, no sore throat and no vomiting   Associated symptoms comment:  Chills  Patient is a morbidly obese African American female with a history of diabetes, hypertension, COPD, asthma, CHF and CAD s/p stents (most recently in May 2015) who presents with shortness of breath. Patient states the last 2 days she has had difficulty breathing. She wears 3 L of oxygen at home. She reports a cough productive of white and yellow sputum. She denies hemoptysis. She reports leg swelling but states that this has been improving. She takes Lasix 40 mg 3 times a day. He has not had any recent adjustments in her medication. She reports orthopnea and PND.  Past Medical History  Diagnosis Date  . Diabetes mellitus without complication   . PE (pulmonary embolism)   . Hypercholesterolemia   . Hypertension   . COPD (chronic obstructive pulmonary disease)   . Asthma   . Coronary artery disease   . CHF (congestive heart failure)   . Mental disorder     BIPOLAR  . Shortness of breath   . GERD (gastroesophageal reflux disease)   . Neuromuscular disorder     DIABETIC NEUROPATHY  . Ischemic cardiomyopathy   . Bipolar disorder   . Post-traumatic stress syndrome   . On home oxygen therapy     "2L most all the time" (02/06/2014)   Past Surgical History  Procedure Laterality  Date  . Coronary angioplasty with stent placement      CAD in 2006 x 2 and 2009 2 more- place din REx in [place] and [place] [facility]  . Esophagogastroduodenoscopy (egd) with propofol N/A 08/03/2013    Procedure: ESOPHAGOGASTRODUODENOSCOPY (EGD) WITH PROPOFOL;  Surgeon: Jeryl Columbia, MD;  Location: [facility];  Service: Endoscopy;  Laterality: N/A;  . Colonoscopy with propofol N/A 08/03/2013    Procedure: COLONOSCOPY WITH PROPOFOL;  Surgeon: Jeryl Columbia, MD;  Location: [facility];  Service: Endoscopy;  Laterality: N/A;  . Coronary angioplasty with stent placement  10/07/2013    Xience [place] DES 2.75  mm x 15  mm   Family History  Problem Relation Age of Onset  . Stroke Mother   . Cancer Father    History  Substance Use Topics  . Smoking status: Former Smoker    Types: Cigarettes    Quit date: 11/24/2012  . Smokeless tobacco: Never Used  . Alcohol Use: No   OB History   Grav Para Term Preterm Abortions TAB SAB Ect Mult Living                 Review of Systems  Constitutional: Positive for chills. Negative for fever.  HENT: Negative for rhinorrhea and sore throat.   Eyes: Negative for visual disturbance.  Respiratory: Positive for cough and shortness of breath. Negative for chest tightness.   Cardiovascular: Negative for  chest pain and palpitations.  Gastrointestinal: Negative for nausea, vomiting, abdominal pain and constipation.  Genitourinary: Negative for dysuria and hematuria.  Musculoskeletal: Negative for back pain and neck pain.  Skin: Negative for rash.  Neurological: Negative for dizziness and headaches.  Psychiatric/Behavioral: Negative for confusion.  All other systems reviewed and are negative.  Allergies  Metolazone; Plavix; and Nsaids  Home Medications   Prior to Admission medications   Medication Sig Start Date End Date Taking? Authorizing Provider  albuterol (PROVENTIL HFA;VENTOLIN HFA) 108 (90 BASE) MCG/ACT inhaler Inhale 2 puffs into the lungs every 6 (six)  hours as needed for wheezing or shortness of breath.  06/29/13   Dorie Rank, MD  albuterol (PROVENTIL) (2.5 MG/3ML) 0.083% nebulizer solution Take 6 mLs (5 mg total) by nebulization every 4 (four) hours as needed for wheezing or shortness of breath. 06/24/13   Stephani Police Piepenbrink, PA-C  aspirin 81 MG tablet Take 1 tablet (81 mg total) by mouth daily. 10/09/13   Rhonda G Barrett, PA-C  benzonatate (TESSALON) 100 MG capsule Take 1 capsule (100 mg total) by mouth 3 (three) times daily as needed for cough. 02/08/14   Kelvin Cellar, MD  carvedilol (COREG) 25 MG tablet Take 12.5 mg by mouth 2 (two) times daily with a meal.     Historical Provider, MD  cycloSPORINE (RESTASIS) 0.05 % ophthalmic emulsion Place 2 drops into both eyes 2 (two) times daily as needed (for dry eyes).     Historical Provider, MD  diazepam (VALIUM) 10 MG tablet Take 0.5 tablets (5 mg total) by mouth every 8 (eight) hours as needed for anxiety. 07/16/13   Allie Bossier, MD  esomeprazole (NEXIUM) 40 MG capsule Take 40 mg by mouth 2 (two) times daily.    Historical Provider, MD  fluticasone (FLONASE) 50 MCG/ACT nasal spray Place 2 sprays into both nostrils 2 (two) times daily.     Historical Provider, MD  furosemide (LASIX) 40 MG tablet Take 2 tablets (80 mg total) by mouth 2 (two) times daily. 10/09/13   Rhonda G Barrett, PA-C  Glucose Blood (ACCU-CHEK ADVANTAGE TEST VI) 1 each by Other route See admin instructions. Check blood sugar 4 times daily.    Historical Provider, MD  insulin aspart (NOVOLOG) 100 UNIT/ML injection Inject 15-20 Units into the skin 3 (three) times daily before meals. Sliding scale    Historical Provider, MD  insulin detemir (LEVEMIR) 100 UNIT/ML injection Inject 35 Units into the skin at bedtime.     Historical Provider, MD  levofloxacin (LEVAQUIN) 500 MG tablet Take 1 tablet (500 mg total) by mouth daily. 02/08/14   Kelvin Cellar, MD  lisinopril (PRINIVIL,ZESTRIL) 10 MG tablet Take 1 tablet (10 mg total) by mouth  daily. 10/09/13   Rhonda G Barrett, PA-C  nitroGLYCERIN (NITROSTAT) 0.4 MG SL tablet Place 1 tablet (0.4 mg total) under the tongue every 5 (five) minutes as needed for chest pain. 03/01/14   Brett Canales, PA-C  omeprazole (PRILOSEC) 40 MG capsule Take 40 mg by mouth 2 (two) times daily.    Historical Provider, MD  Paliperidone Palmitate (INVEGA SUSTENNA) 117 MG/0.75ML SUSP Inject 156 mg into the muscle every 30 (thirty) days. One injection monthly. On the 16th of every month    Historical Provider, MD  potassium chloride (K-DUR) 10 MEQ tablet Take 10 mEq by mouth 2 (two) times daily.     Historical Provider, MD  prasugrel (EFFIENT) 10 MG TABS tablet Take 1 tablet (10 mg total) by mouth daily.  10/09/13   Rhonda G Barrett, PA-C  pravastatin (PRAVACHOL) 40 MG tablet Take 40 mg by mouth every evening.     Historical Provider, MD  predniSONE (STERAPRED UNI-PAK) 10 MG tablet Take 6-5-4-3-2-1 tablets by mouth daily till gone. 02/08/14   Kelvin Cellar, MD   BP 142/83  Pulse 85  Temp(Src) 100.3 F (37.9 C) (Oral)  Resp 27  SpO2 98%  LMP 05/30/2013 Physical Exam  Constitutional: She is oriented to person, place, and time. She appears well-developed and well-nourished. No distress.  HENT:  Head: Normocephalic and atraumatic.  Mouth/Throat: Oropharynx is clear and moist.  Eyes: EOM are normal. Pupils are equal, round, and reactive to light.  Neck: Neck supple. No JVD present.  Cardiovascular: Normal rate, regular rhythm, normal heart sounds and intact distal pulses.  Exam reveals no gallop.   No murmur heard. Pulmonary/Chest: Effort normal. She has decreased breath sounds. She has wheezes. She has rales.  Conversational dyspnea  Abdominal: Soft. She exhibits no distension. There is no tenderness.  Musculoskeletal: Normal range of motion. She exhibits edema (pitting, LEs.). She exhibits no tenderness.  Neurological: She is alert and oriented to person, place, and time. No cranial nerve deficit. She  exhibits normal muscle tone.  Skin: Skin is warm and dry. No rash noted.  Psychiatric: Her behavior is normal.    ED Course  Procedures  Angiocath insertion Performed by: Gustavus Bryant  Consent: Verbal consent obtained. Risks and benefits: risks, benefits and alternatives were discussed Time out: Immediately prior to procedure a "time out" was called to verify the correct patient, procedure, equipment, support staff and site/side marked as required.  Preparation: Patient was prepped and draped in the usual sterile fashion.  Vein Location: right AC  Ultrasound Guided  Gauge: 20  Normal blood return and flush without difficulty Patient tolerance: Patient tolerated the procedure well with no immediate complications.  Labs Review Labs Reviewed  CBC WITH DIFFERENTIAL - Abnormal; Notable for the following:    Hemoglobin 10.0 (*)    HCT 32.0 (*)    MCH 25.1 (*)    RDW 18.1 (*)    All other components within normal limits  BASIC METABOLIC PANEL - Abnormal; Notable for the following:    Glucose, Bld 144 (*)    GFR calc non Af Amer 79 (*)    All other components within normal limits  PRO B NATRIURETIC PEPTIDE - Abnormal; Notable for the following:    Pro B Natriuretic peptide (BNP) 969.3 (*)    All other components within normal limits  CBC - Abnormal; Notable for the following:    Hemoglobin 10.0 (*)    HCT 32.0 (*)    MCH 24.4 (*)    RDW 17.9 (*)    All other components within normal limits  I-STAT ARTERIAL BLOOD GAS, ED - Abnormal; Notable for the following:    pH, Arterial 7.338 (*)    pCO2 arterial 60.5 (*)    pO2, Arterial 42.0 (*)    Bicarbonate 32.2 (*)    Acid-Base Excess 5.0 (*)    All other components within normal limits  CULTURE, BLOOD (ROUTINE X 2)  CULTURE, BLOOD (ROUTINE X 2)  LACTIC ACID, PLASMA  CREATININE, SERUM  TSH  TROPONIN I  BLOOD GAS, ARTERIAL  COMPREHENSIVE METABOLIC PANEL  CBC WITH DIFFERENTIAL  TROPONIN I  HEMOGLOBIN A1C  I-STAT  TROPOININ, ED    Imaging Review Dg Chest 2 View  03/04/2014   CLINICAL DATA:  Acute shortness of breath  for 2 days. History of hypertension, asthma, diabetes and congestive heart failure. Initial encounter.  EXAM: CHEST  2 VIEW  COMPARISON:  Radiographs 02/06/2014.  CT 02/01/2014.  FINDINGS: The heart size and mediastinal contours are stable. There is stable chronic lung disease with linear scarring in the left upper lobe. There is new horizontal density within the right minor fissure which likely represents a small amount of loculated pleural fluid. No acute osseous findings are evident.  IMPRESSION: New small amount of fluid within the minor fissure. Otherwise stable chest with mild chronic lung disease.   Electronically Signed   By: Camie Patience M.D.   On: 03/04/2014 20:09   Ct Angio Chest Pe W/cm &/or Wo Cm  03/04/2014   CLINICAL DATA:  Shortness of breath.  COPD exacerbation.  On oxygen.  EXAM: CT ANGIOGRAPHY CHEST WITH CONTRAST  TECHNIQUE: Multidetector CT imaging of the chest was performed using the standard protocol during bolus administration of intravenous contrast. Multiplanar CT image reconstructions and MIPs were obtained to evaluate the vascular anatomy.  CONTRAST:  127mL OMNIPAQUE IOHEXOL 350 MG/ML SOLN  COMPARISON:  Chest radiographs obtained earlier today. Chest CTA dated 02/01/2014.  FINDINGS: Increased prominence of the pulmonary vasculature and mild increase in prominence of the interstitial markings. Areas of subsegmental atelectasis and scarring bilaterally, with little overall change. Mild left posterior pleural thickening and calcification are unchanged. No lung nodules or enlarged lymph nodes. Interval small amount of loculated fluid in the lateral aspect of the minor fissure on the right. Mild bilateral bullous changes. Previously noted occlusion of the main right lower lobe pulmonary artery. Otherwise, normally opacified pulmonary arteries with no visible filling defects. Mild  thoracic spine degenerative changes. Unremarkable upper abdomen.  Review of the MIP images confirms the above findings.  IMPRESSION: 1. Mild changes of congestive heart failure superimposed on COPD. 2. No acute pulmonary emboli seen. 3. Stable chronic right lower lobe pulmonary artery occlusion, most likely due to previous pulmonary embolic disease. 4. Mildly increased bilateral atelectasis with stable underlying scarring.   Electronically Signed   By: Enrique Sack M.D.   On: 03/04/2014 22:54     EKG Interpretation   Date/Time:  Monday March 04 2014 18:33:35 EDT Ventricular Rate:  99 PR Interval:  153 QRS Duration: 76 QT Interval:  401 QTC Calculation: 515 R Axis:   28 Text Interpretation:  Sinus rhythm Inferior infarct, old Anterior infarct,  old Prolonged QT interval No significant change since last tracing  Confirmed by ALLEN  MD, ANTHONY ([id number]) on 03/04/2014 7:26:01 PM      MDM   Final diagnoses:  Acute on chronic respiratory failure with hypercapnia   Obese 53 yo AAF presents with SOB. Crackles and wheezes on exam. 100% on 3L [place]. Suspect CHF vs. COPD exac. Will given duonebs, steroids, and obtain labs, troponin, CXR, ECG. EKG NSR, rate 99, no acute ischemic changes, QTc 515. CXR with possible right effusion. CTA chest neg for acute PE. Patient remains tachypneic but maintaining O2 sats >95%. No need for NIPPV.  Gave lasix 40 mg IV. Trop negative. ABG 7.33, CO2 60.5. bNP 970.  Gave levaquin empirically for possible PNA. Will admit for COPD vs. CHF exacerbation. Patient stable prior to transfer to floor.   Case discussed with Dr. Zenia Resides.   Gustavus Bryant, MD 03/04/14 [id number]  Gustavus Bryant, MD 03/04/14 2330

## 2014-03-04 NOTE — ED Notes (Signed)
Pt to xray at this time.

## 2014-03-05 DIAGNOSIS — Z86711 Personal history of pulmonary embolism: Secondary | ICD-10-CM

## 2014-03-05 DIAGNOSIS — F4311 Post-traumatic stress disorder, acute: Secondary | ICD-10-CM

## 2014-03-05 DIAGNOSIS — Z794 Long term (current) use of insulin: Secondary | ICD-10-CM

## 2014-03-05 DIAGNOSIS — I10 Essential (primary) hypertension: Secondary | ICD-10-CM

## 2014-03-05 DIAGNOSIS — J9622 Acute and chronic respiratory failure with hypercapnia: Secondary | ICD-10-CM

## 2014-03-05 DIAGNOSIS — I255 Ischemic cardiomyopathy: Secondary | ICD-10-CM

## 2014-03-05 DIAGNOSIS — F3170 Bipolar disorder, currently in remission, most recent episode unspecified: Secondary | ICD-10-CM

## 2014-03-05 DIAGNOSIS — E119 Type 2 diabetes mellitus without complications: Secondary | ICD-10-CM

## 2014-03-05 DIAGNOSIS — E785 Hyperlipidemia, unspecified: Secondary | ICD-10-CM

## 2014-03-05 DIAGNOSIS — I517 Cardiomegaly: Secondary | ICD-10-CM

## 2014-03-05 DIAGNOSIS — J441 Chronic obstructive pulmonary disease with (acute) exacerbation: Secondary | ICD-10-CM

## 2014-03-05 DIAGNOSIS — I5042 Chronic combined systolic (congestive) and diastolic (congestive) heart failure: Secondary | ICD-10-CM

## 2014-03-05 DIAGNOSIS — Z720 Tobacco use: Secondary | ICD-10-CM

## 2014-03-05 LAB — GLUCOSE, CAPILLARY
GLUCOSE-CAPILLARY: 298 mg/dL — AB (ref 70–99)
GLUCOSE-CAPILLARY: 432 mg/dL — AB (ref 70–99)
GLUCOSE-CAPILLARY: 426 mg/dL — AB (ref 70–99)
Glucose-Capillary: 273 mg/dL — ABNORMAL HIGH (ref 70–99)
Glucose-Capillary: 286 mg/dL — ABNORMAL HIGH (ref 70–99)
Glucose-Capillary: 297 mg/dL — ABNORMAL HIGH (ref 70–99)
Glucose-Capillary: 408 mg/dL — ABNORMAL HIGH (ref 70–99)

## 2014-03-05 LAB — TROPONIN I: Troponin I: <0.3 ng/mL (ref <0.30)

## 2014-03-05 LAB — CBC WITH DIFFERENTIAL/PLATELET
BASOS PCT: 0 % (ref 0–1)
Basophils Absolute: 0 10*3/uL (ref 0.0–0.1)
Eosinophils Absolute: 0 10*3/uL (ref 0.0–0.7)
Eosinophils Relative: 0 % (ref 0–5)
HEMATOCRIT: 32.1 % — AB (ref 36.0–46.0)
HEMOGLOBIN: 9.9 g/dL — AB (ref 12.0–15.0)
LYMPHS ABS: 1.1 10*3/uL (ref 0.7–4.0)
Lymphocytes Relative: 14 % (ref 12–46)
MCH: 24.2 pg — AB (ref 26.0–34.0)
MCHC: 30.8 g/dL (ref 30.0–36.0)
MCV: 78.5 fL (ref 78.0–100.0)
MONO ABS: 0.1 10*3/uL (ref 0.1–1.0)
MONOS PCT: 2 % — AB (ref 3–12)
NEUTROS PCT: 84 % — AB (ref 43–77)
Neutro Abs: 6.2 10*3/uL (ref 1.7–7.7)
Platelets: 258 10*3/uL (ref 150–400)
RBC: 4.09 MIL/uL (ref 3.87–5.11)
RDW: 18 % — ABNORMAL HIGH (ref 11.5–15.5)
WBC: 7.4 10*3/uL (ref 4.0–10.5)

## 2014-03-05 LAB — COMPREHENSIVE METABOLIC PANEL
ALBUMIN: 2.8 g/dL — AB (ref 3.5–5.2)
ALT: 13 U/L (ref 0–35)
AST: 11 U/L (ref 0–37)
Alkaline Phosphatase: 66 U/L (ref 39–117)
Anion gap: 11 (ref 5–15)
BUN: 16 mg/dL (ref 6–23)
CO2: 28 mEq/L (ref 19–32)
Calcium: 8.6 mg/dL (ref 8.4–10.5)
Chloride: 97 mEq/L (ref 96–112)
Creatinine, Ser: 0.78 mg/dL (ref 0.50–1.10)
GFR calc non Af Amer: >90 mL/min (ref >90)
Glucose, Bld: 300 mg/dL — ABNORMAL HIGH (ref 70–99)
POTASSIUM: 4.8 meq/L (ref 3.7–5.3)
SODIUM: 136 meq/L — AB (ref 137–147)
TOTAL PROTEIN: 7.9 g/dL (ref 6.0–8.3)
Total Bilirubin: 0.2 mg/dL — ABNORMAL LOW (ref 0.3–1.2)

## 2014-03-05 LAB — HEMOGLOBIN A1C
Hgb A1c MFr Bld: 8.9 % — ABNORMAL HIGH (ref <5.7)
MEAN PLASMA GLUCOSE: 209 mg/dL — AB (ref <117)

## 2014-03-05 LAB — GLUCOSE, RANDOM: GLUCOSE: 434 mg/dL — AB (ref 70–99)

## 2014-03-05 LAB — MRSA PCR SCREENING: MRSA by PCR: NEGATIVE

## 2014-03-05 MED ORDER — PERFLUTREN LIPID MICROSPHERE
INTRAVENOUS | Status: AC
  Administered 2014-03-05: 2 mL via INTRAVENOUS
  Filled 2014-03-05: qty 10

## 2014-03-05 MED ORDER — CETYLPYRIDINIUM CHLORIDE 0.05 % MT LIQD
7.0000 mL | Freq: Two times a day (BID) | OROMUCOSAL | Status: DC
  Administered 2014-03-05 – 2014-03-08 (×5): 7 mL via OROMUCOSAL

## 2014-03-05 MED ORDER — PRAVASTATIN SODIUM 40 MG PO TABS
60.0000 mg | ORAL_TABLET | Freq: Every evening | ORAL | Status: DC
  Administered 2014-03-06 – 2014-03-07 (×2): 60 mg via ORAL
  Filled 2014-03-05 (×3): qty 1

## 2014-03-05 MED ORDER — INSULIN ASPART 100 UNIT/ML ~~LOC~~ SOLN: 12.0000 [IU] | Freq: Once | SUBCUTANEOUS | Status: DC

## 2014-03-05 MED ORDER — GI COCKTAIL ~~LOC~~
30.0000 mL | Freq: Once | ORAL | Status: AC
  Administered 2014-03-05: 30 mL via ORAL
  Filled 2014-03-05: qty 30

## 2014-03-05 MED ORDER — INSULIN DETEMIR 100 UNIT/ML ~~LOC~~ SOLN
35.0000 [IU] | Freq: Every day | SUBCUTANEOUS | Status: DC
  Administered 2014-03-05 – 2014-03-06 (×2): 35 [IU] via SUBCUTANEOUS
  Filled 2014-03-05 (×4): qty 0.35

## 2014-03-05 MED ORDER — FUROSEMIDE 10 MG/ML IJ SOLN
80.0000 mg | Freq: Two times a day (BID) | INTRAMUSCULAR | Status: DC
  Administered 2014-03-05 – 2014-03-06 (×2): 80 mg via INTRAVENOUS
  Filled 2014-03-05 (×3): qty 8

## 2014-03-05 MED ORDER — INSULIN ASPART 100 UNIT/ML ~~LOC~~ SOLN
7.0000 [IU] | Freq: Once | SUBCUTANEOUS | Status: AC
  Administered 2014-03-05: 7 [IU] via SUBCUTANEOUS

## 2014-03-05 MED ORDER — INSULIN ASPART 100 UNIT/ML ~~LOC~~ SOLN
0.0000 [IU] | Freq: Three times a day (TID) | SUBCUTANEOUS | Status: DC
  Administered 2014-03-05: 8 [IU] via SUBCUTANEOUS

## 2014-03-05 MED ORDER — IPRATROPIUM-ALBUTEROL 0.5-2.5 (3) MG/3ML IN SOLN
3.0000 mL | RESPIRATORY_TRACT | Status: DC
  Administered 2014-03-05 – 2014-03-06 (×5): 3 mL via RESPIRATORY_TRACT
  Filled 2014-03-05 (×5): qty 3

## 2014-03-05 MED ORDER — INSULIN ASPART 100 UNIT/ML ~~LOC~~ SOLN: 0.0000 [IU] | SUBCUTANEOUS | Status: DC

## 2014-03-05 MED ORDER — PERFLUTREN LIPID MICROSPHERE
1.0000 mL | INTRAVENOUS | Status: AC | PRN
  Administered 2014-03-05: 2 mL via INTRAVENOUS
  Filled 2014-03-05: qty 10

## 2014-03-05 MED ORDER — INSULIN ASPART 100 UNIT/ML ~~LOC~~ SOLN
0.0000 [IU] | SUBCUTANEOUS | Status: DC
  Administered 2014-03-05: 10 [IU] via SUBCUTANEOUS
  Administered 2014-03-06: 20 [IU] via SUBCUTANEOUS
  Administered 2014-03-06: 4 [IU] via SUBCUTANEOUS
  Administered 2014-03-06: 7 [IU] via SUBCUTANEOUS

## 2014-03-05 NOTE — Progress Notes (Signed)
[facility] TEAM 1 - Stepdown/ICU TEAM Progress Note  Tamara Silva [id number] DOB: [birth year] DOA: 03/04/2014 PCP: Kerin Perna, NP  Admit HPI / Brief Narrative: 53 y.o. BF PMHx Morbid obesity, chronic resp failure on 2L O2, COPD, chronic mixed CHF, coronary artery disease status post stenting, diabetes type 2 uncontrolled, hx/o PE presenting with acute resp failure with hypoxia. Patient reports 2 days of progressive increased worker breathing, cough, wheezing, positive sputum production. Has been using increased amounts of albuterol at home. Positive mild subjective fevers and chills at home. Denies any chest pain. No abdominal pain. Denies any worsening lower extremity edema. Stasis she has been compliant with her CHF regimen. Denies any heavy salt or NSAID intake. Has had some running nose nasal congestion.  Presented to the ER with MAXIMUM TEMPERATURE 100.3, heart rate in the 80s to 100s, respirations and 20s to 30s, blood pressure in the 120s to 170s over 60s to 90s. Satting greater than 94% on 3 L nasal cannula. What also, 0.2, hemoglobin 10, creatinine 0.83. Pro BNP 970 (baseline around 400). Troponin negative x1. Lactate 0.5. ABG indicative of a mildly decompensated chronic respiratory acidosis. Chest x-ray shows new small amount of fluid within the minor fissure. Otherwise stable chest x-ray. CT angiogram is pending. This will be her third CT angiogram this year. EKG was sinus rhythm. Heart rate 99. QTC mildly elevated at 515 milliseconds. Start on IV Solu-Medrol and IV Levaquin in the ER. Status post 40 mg of Lasix IV x1.   HPI/Subjective: 10/6  A/O x4, acute on chronic respiratory failure. Patient admits she's smoking again. Noncompliant with medications.  Assessment/Plan: Acute on chronic resp failure with hypoxia/COPD  -Likely multifactorial with contributions of COPD, chronic mixed CHF, body habitus  -no infiltrate on imaging  -CT angiogram; negative for PE, see results  below   -Continue IV Levaquin IV Solu-Medrol  -Schedule duo nebs  -IV Lasix-watch urine output closely  History PE  -Not on chronic anti-coagulation?  Chronic systolic and diastolic CHF  -May [id number] echocardiogram; EF of 35-40% and grade 2 diastolic dysfunction.  -Myoview April 2015 with low risk Myoview with severe LV dysfunction and no evidence of ischemia  -Repeat 2-D echo given recurrent admissions for above  -mildly elevated QTc on EKG-follow in setting of levaquin use  -Strict ins and outs; since admission net - 1.7 liters -Daily weight, admission weight =143 kg -Lasix IV 80 mg BID (home dose)   -10/5 proBNP= 969  CAD - Prasugrel 10 mg daily -S/P Stenting -Asymptomatic  HTN  -BP within AHA/ADA guidelines -Aspirin 81 mg daily  -Coreg 12.5 mg  BID -Lisinopril 10 mg daily  Diabetes type 2 uncontrolled -NovoLog 7 units x1 (CBG> 400); NovoLog 12 units x1 (CBG remained> 400) -Resistant SSI  -Levemir 35 units QHS  - 9/5 hemoglobin A1c = 8.9  -Currently patient's difficult to control hyperglycemia not only secondary to her underlying poorly controlled diabetes but iatrogenic (steroids)  HLD  -Lipid panel; LDL slightly elevated above ADA guidelines -Increase Pravachol 60 mg daily    Bipolar disorder/PTSD/anxiety -Continue aspirin 5 mg TID PRN  -Paliperidone 156 mg injected on the 16th of every month    Nicotine abuse -Again counseled patient on cessation    Code Status: FULL Family Communication: no family present at time of exam Disposition Plan: Resolution acute on chronic respiratory failure    Consultants: NA    Procedure/Significant Events: 10/5 CT angiogram chest;Mild changes CHF superimposed on COPD.  -No acute pulmonary emboli  seen.  - Stable chronic RLL pulmonary artery occlusion, due to previous PE .  - Mildly increased bilateral atelectasis with stable underlying scarring.     Culture 10/5 blood pending 10/6 MRSA by PCR negative 10/6 urine  pending   Antibiotics: Levofloxacin 10/5>>   DVT prophylaxis: Subcutaneous heparin   Devices    LINES / TUBES:      Continuous Infusions:   Objective: VITAL SIGNS: Temp: 97.7 F (36.5 C) (10/06 0819) Temp Source: Oral (10/06 0819) BP: 126/59 mmHg (10/06 0819) Pulse Rate: 78 (10/06 0819) SPO2;93% on 2 LO2 via [place] FIO2:   Intake/Output Summary (Last 24 hours) at 03/05/14 1053 Last data filed at 03/05/14 1000  Gross per 24 hour  Intake     60 ml  Output   1875 ml  Net  -1815 ml     Exam: General: A/O x4,  No acute respiratory distress Lungs: Diffuse Expiatory wheezing, Bilat Bibasilar crackles  Cardiovascular: Regular rate and rhythm without murmur gallop or rub normal S1 and S2 Abdomen: Morbidly obese, Nontender, nondistended, soft, bowel sounds positive, no rebound, no ascites, no appreciable mass Extremities: No significant cyanosis, clubbing, or edema bilateral lower extremities  Data Reviewed: Basic Metabolic Panel:  Recent Labs Lab 03/04/14 1830 03/04/14 2152 03/05/14 0530  NA 138  --  136*  K 4.1  --  4.8  CL 100  --  97  CO2 30  --  28  GLUCOSE 144*  --  300*  BUN 11  --  16  CREATININE 0.83 0.76 0.78  CALCIUM 8.7  --  8.6   Liver Function Tests:  Recent Labs Lab 03/05/14 0530  AST 11  ALT 13  ALKPHOS 66  BILITOT 0.2*  PROT 7.9  ALBUMIN 2.8*   No results found for this basename: LIPASE, AMYLASE,  in the last 168 hours No results found for this basename: AMMONIA,  in the last 168 hours CBC:  Recent Labs Lab 03/04/14 1830 03/04/14 2152 03/05/14 0530  WBC 9.2 9.2 7.4  NEUTROABS 5.7  --  6.2  HGB 10.0* 10.0* 9.9*  HCT 32.0* 32.0* 32.1*  MCV 80.4 78.2 78.5  PLT 270 268 258   Cardiac Enzymes:  Recent Labs Lab 03/04/14 2153 03/05/14 0940  TROPONINI <0.30 <0.30   BNP (last 3 results)  Recent Labs  02/01/14 1616 02/06/14 1727 03/04/14 1830  PROBNP 371.1* 405.8* 969.3*   CBG:  Recent Labs Lab 03/05/14 0019  03/05/14 0423 03/05/14 0821  GLUCAP 273* 298* 297*    Recent Results (from the past 240 hour(s))  MRSA PCR SCREENING     Status: None   Collection Time    03/05/14 12:06 AM      Result Value Ref Range Status   MRSA by PCR NEGATIVE  NEGATIVE Final   Comment:            The GeneXpert MRSA Assay (FDA     approved for NASAL specimens     only), is one component of a     comprehensive MRSA colonization     surveillance program. It is not     intended to diagnose MRSA     infection nor to guide or     monitor treatment for     MRSA infections.     Studies:  Recent x-ray studies have been reviewed in detail by the Attending Physician  Scheduled Meds:  Scheduled Meds: . aspirin EC  81 mg Oral Daily  . carvedilol  12.5 mg  Oral BID WC  . heparin  5,000 Units Subcutaneous 3 times per day  . insulin aspart  0-9 Units Subcutaneous 6 times per day  . insulin detemir  35 Units Subcutaneous QHS  . ipratropium-albuterol  3 mL Nebulization Q4H  . levofloxacin  750 mg Intravenous Q24H  . lisinopril  10 mg Oral Daily  . methylPREDNISolone (SOLU-MEDROL) injection  125 mg Intravenous Q12H  . pantoprazole  80 mg Oral Q1200  . prasugrel  10 mg Oral Daily  . pravastatin  40 mg Oral QPM  . sodium chloride  3 mL Intravenous Q12H  . sodium chloride  3 mL Intravenous Q12H    Time spent on care of this patient: 40 mins   Allie Bossier , MD   [facility] Office  [phone] Pager - [phone]  On-Call/Text Page:      [email or web address]      password [email or web address]  If 7PM-7AM, please contact night-coverage [email or web address] Password [email or web address] 03/05/2014, 10:53 AM   LOS: 1 day

## 2014-03-05 NOTE — Care Management Note (Signed)
ED CM followed up with patient regarding Pulmonologist f/u appt. And PCP f/u appt. Appointment made with Dr. Asencion Noble Oct  29th 9:30 am. Called to attempted to assist patient with scheduling a HFU appt with Family Medicine at [facility] however, there are no available f/u appts with any Providers until November.  At patient's request she would assistance with finding another PCP. Information provided for [facility]. CM will schedule f/u appt once discharge date is verified.

## 2014-03-05 NOTE — ED Provider Notes (Signed)
Medical screening examination/treatment/procedure(s) were performed by non-physician practitioner and as supervising physician I was immediately available for consultation/collaboration.   EKG Interpretation   Date/Time:  Monday March 04 2014 18:33:35 EDT Ventricular Rate:  99 PR Interval:  153 QRS Duration: 76 QT Interval:  401 QTC Calculation: 515 R Axis:   28 Text Interpretation:  Sinus rhythm Inferior infarct, old Anterior infarct,  old Prolonged QT interval No significant change since last tracing  Confirmed by Zenia Resides  MD, Dayzha Pogosyan ([id number]) on 03/04/2014 7:26:01 PM       Leota Jacobsen, MD 03/05/14 1223

## 2014-03-05 NOTE — Progress Notes (Signed)
CRITICAL VALUE ALERT  Critical value received:  cbg 408  Date of notification:  10/[id number]  Time of notification:  [id number]  Critical value read back:Yes.    Nurse who received alert:  Carleene Cooper RN   MD notified (1st page):  Dia Crawford  Time of first page:  1716  MD notified (2nd page):  Time of second page:  Responding MD: Dr. Dia Crawford Time MD responded:  [phone]

## 2014-03-05 NOTE — Progress Notes (Signed)
Inpatient Diabetes Program Recommendations  AACE/ADA: New Consensus Statement on Inpatient Glycemic Control (2013)  Target Ranges:  Prepandial:   less than 140 mg/dL      Peak postprandial:   less than 180 mg/dL (1-2 hours)      Critically ill patients:  140 - 180 mg/dL    Results for MAJESTY, THIND (MRN [id number]) as of 03/05/2014 10:16  Ref. Range 02/08/2014 11:30 02/08/2014 17:16 03/05/2014 00:19 03/05/2014 04:23 03/05/2014 08:21  Glucose-Capillary Latest Range: 70-99 mg/dL 341 (H) 308 (H) 273 (H) 298 (H) 297 (H)    Reason for Assessment: elevated blood sugars  Diabetes history: Type 2  Outpatient Diabetes medications: Levemir 35 units daily, Novolog 15-20 units tid Current orders for Inpatient glycemic control: Levemir 35 units daily , Novolog sensitive 0-9 units q4h  Based on current blood sugars and ongoing steroids, may want to consider increasing Novolog correction to resistant scale  Gentry Fitz, RN, [place], [place], CDE Diabetes Coordinator Inpatient Diabetes Program  [phone] (Team Pager) [phone] [facility] Office) 03/05/2014 10:21 AM

## 2014-03-05 NOTE — Progress Notes (Signed)
Pt admitted from ED. Pt sob with exertion and at rest. Pt put on 2L O2 [place]. Pt A/O HR NSR. Will continue to monitor.

## 2014-03-05 NOTE — Progress Notes (Signed)
Pt CBG 426. [facility] notified and ordered to give pt the scheduled sliding scale dose of 20 units. Will continue monitor pt and check CBG Q4.

## 2014-03-05 NOTE — Progress Notes (Signed)
Echocardiogram 2D Echocardiogram with Definity has been performed.  Tamara Silva 03/05/2014, 12:20 PM

## 2014-03-05 NOTE — Progress Notes (Signed)
UR completed 

## 2014-03-06 ENCOUNTER — Encounter (HOSPITAL_COMMUNITY): Payer: Medicaid Other

## 2014-03-06 DIAGNOSIS — J45909 Unspecified asthma, uncomplicated: Secondary | ICD-10-CM | POA: Diagnosis present

## 2014-03-06 DIAGNOSIS — E1140 Type 2 diabetes mellitus with diabetic neuropathy, unspecified: Secondary | ICD-10-CM | POA: Diagnosis present

## 2014-03-06 DIAGNOSIS — K219 Gastro-esophageal reflux disease without esophagitis: Secondary | ICD-10-CM | POA: Diagnosis present

## 2014-03-06 DIAGNOSIS — Z823 Family history of stroke: Secondary | ICD-10-CM | POA: Diagnosis not present

## 2014-03-06 DIAGNOSIS — E1165 Type 2 diabetes mellitus with hyperglycemia: Secondary | ICD-10-CM | POA: Diagnosis present

## 2014-03-06 DIAGNOSIS — J441 Chronic obstructive pulmonary disease with (acute) exacerbation: Secondary | ICD-10-CM | POA: Diagnosis present

## 2014-03-06 DIAGNOSIS — I252 Old myocardial infarction: Secondary | ICD-10-CM | POA: Diagnosis not present

## 2014-03-06 DIAGNOSIS — E780 Pure hypercholesterolemia: Secondary | ICD-10-CM | POA: Diagnosis present

## 2014-03-06 DIAGNOSIS — I2510 Atherosclerotic heart disease of native coronary artery without angina pectoris: Secondary | ICD-10-CM | POA: Diagnosis present

## 2014-03-06 DIAGNOSIS — Z86711 Personal history of pulmonary embolism: Secondary | ICD-10-CM | POA: Diagnosis not present

## 2014-03-06 DIAGNOSIS — Z955 Presence of coronary angioplasty implant and graft: Secondary | ICD-10-CM | POA: Diagnosis not present

## 2014-03-06 DIAGNOSIS — I2782 Chronic pulmonary embolism: Secondary | ICD-10-CM | POA: Diagnosis present

## 2014-03-06 DIAGNOSIS — Z886 Allergy status to analgesic agent status: Secondary | ICD-10-CM | POA: Diagnosis not present

## 2014-03-06 DIAGNOSIS — Z6841 Body Mass Index (BMI) 40.0 and over, adult: Secondary | ICD-10-CM | POA: Diagnosis not present

## 2014-03-06 DIAGNOSIS — E872 Acidosis: Secondary | ICD-10-CM | POA: Diagnosis present

## 2014-03-06 DIAGNOSIS — F17210 Nicotine dependence, cigarettes, uncomplicated: Secondary | ICD-10-CM | POA: Diagnosis present

## 2014-03-06 DIAGNOSIS — I5043 Acute on chronic combined systolic (congestive) and diastolic (congestive) heart failure: Secondary | ICD-10-CM | POA: Diagnosis present

## 2014-03-06 DIAGNOSIS — Z9981 Dependence on supplemental oxygen: Secondary | ICD-10-CM | POA: Diagnosis not present

## 2014-03-06 DIAGNOSIS — E785 Hyperlipidemia, unspecified: Secondary | ICD-10-CM | POA: Diagnosis present

## 2014-03-06 DIAGNOSIS — I129 Hypertensive chronic kidney disease with stage 1 through stage 4 chronic kidney disease, or unspecified chronic kidney disease: Secondary | ICD-10-CM | POA: Diagnosis present

## 2014-03-06 DIAGNOSIS — Z9114 Patient's other noncompliance with medication regimen: Secondary | ICD-10-CM | POA: Diagnosis present

## 2014-03-06 DIAGNOSIS — F419 Anxiety disorder, unspecified: Secondary | ICD-10-CM | POA: Diagnosis present

## 2014-03-06 DIAGNOSIS — Z809 Family history of malignant neoplasm, unspecified: Secondary | ICD-10-CM | POA: Diagnosis not present

## 2014-03-06 DIAGNOSIS — J9622 Acute and chronic respiratory failure with hypercapnia: Secondary | ICD-10-CM | POA: Diagnosis present

## 2014-03-06 DIAGNOSIS — F4310 Post-traumatic stress disorder, unspecified: Secondary | ICD-10-CM | POA: Diagnosis present

## 2014-03-06 DIAGNOSIS — Z9111 Patient's noncompliance with dietary regimen: Secondary | ICD-10-CM | POA: Diagnosis present

## 2014-03-06 DIAGNOSIS — Z794 Long term (current) use of insulin: Secondary | ICD-10-CM | POA: Diagnosis not present

## 2014-03-06 DIAGNOSIS — I255 Ischemic cardiomyopathy: Secondary | ICD-10-CM | POA: Diagnosis present

## 2014-03-06 DIAGNOSIS — Z7982 Long term (current) use of aspirin: Secondary | ICD-10-CM | POA: Diagnosis not present

## 2014-03-06 DIAGNOSIS — R0602 Shortness of breath: Secondary | ICD-10-CM | POA: Diagnosis present

## 2014-03-06 DIAGNOSIS — G4730 Sleep apnea, unspecified: Secondary | ICD-10-CM | POA: Diagnosis present

## 2014-03-06 DIAGNOSIS — Z889 Allergy status to unspecified drugs, medicaments and biological substances status: Secondary | ICD-10-CM | POA: Diagnosis not present

## 2014-03-06 DIAGNOSIS — E669 Obesity, unspecified: Secondary | ICD-10-CM | POA: Diagnosis present

## 2014-03-06 DIAGNOSIS — F319 Bipolar disorder, unspecified: Secondary | ICD-10-CM | POA: Diagnosis present

## 2014-03-06 LAB — CBC WITH DIFFERENTIAL/PLATELET
Basophils Absolute: 0 10*3/uL (ref 0.0–0.1)
Basophils Relative: 0 % (ref 0–1)
Eosinophils Absolute: 0 10*3/uL (ref 0.0–0.7)
Eosinophils Relative: 0 % (ref 0–5)
HCT: 31.2 % — ABNORMAL LOW (ref 36.0–46.0)
HEMOGLOBIN: 9.6 g/dL — AB (ref 12.0–15.0)
LYMPHS ABS: 1 10*3/uL (ref 0.7–4.0)
Lymphocytes Relative: 11 % — ABNORMAL LOW (ref 12–46)
MCH: 24.4 pg — ABNORMAL LOW (ref 26.0–34.0)
MCHC: 30.8 g/dL (ref 30.0–36.0)
MCV: 79.2 fL (ref 78.0–100.0)
MONOS PCT: 7 % (ref 3–12)
Monocytes Absolute: 0.7 10*3/uL (ref 0.1–1.0)
NEUTROS ABS: 7.1 10*3/uL (ref 1.7–7.7)
NEUTROS PCT: 82 % — AB (ref 43–77)
Platelets: 270 10*3/uL (ref 150–400)
RBC: 3.94 MIL/uL (ref 3.87–5.11)
RDW: 17.8 % — ABNORMAL HIGH (ref 11.5–15.5)
WBC: 8.8 10*3/uL (ref 4.0–10.5)

## 2014-03-06 LAB — COMPREHENSIVE METABOLIC PANEL
ALBUMIN: 2.8 g/dL — AB (ref 3.5–5.2)
ALT: 13 U/L (ref 0–35)
ANION GAP: 12 (ref 5–15)
AST: 10 U/L (ref 0–37)
Alkaline Phosphatase: 63 U/L (ref 39–117)
BUN: 32 mg/dL — AB (ref 6–23)
CO2: 27 mEq/L (ref 19–32)
Calcium: 8.9 mg/dL (ref 8.4–10.5)
Chloride: 98 mEq/L (ref 96–112)
Creatinine, Ser: 0.95 mg/dL (ref 0.50–1.10)
GFR calc Af Amer: 77 mL/min — ABNORMAL LOW (ref >90)
GFR calc non Af Amer: 67 mL/min — ABNORMAL LOW (ref >90)
Glucose, Bld: 248 mg/dL — ABNORMAL HIGH (ref 70–99)
POTASSIUM: 4.9 meq/L (ref 3.7–5.3)
SODIUM: 137 meq/L (ref 137–147)
Total Bilirubin: <0.2 mg/dL — ABNORMAL LOW (ref 0.3–1.2)
Total Protein: 7.8 g/dL (ref 6.0–8.3)

## 2014-03-06 LAB — GLUCOSE, CAPILLARY
GLUCOSE-CAPILLARY: 161 mg/dL — AB (ref 70–99)
GLUCOSE-CAPILLARY: 376 mg/dL — AB (ref 70–99)
Glucose-Capillary: 204 mg/dL — ABNORMAL HIGH (ref 70–99)
Glucose-Capillary: 189 mg/dL — ABNORMAL HIGH (ref 70–99)
Glucose-Capillary: 148 mg/dL — ABNORMAL HIGH (ref 70–99)
Glucose-Capillary: 228 mg/dL — ABNORMAL HIGH (ref 70–99)

## 2014-03-06 MED ORDER — MAGNESIUM CITRATE PO SOLN
1.0000 | Freq: Once | ORAL | Status: AC
  Administered 2014-03-06: 1 via ORAL
  Filled 2014-03-06 (×2): qty 296

## 2014-03-06 MED ORDER — IPRATROPIUM-ALBUTEROL 0.5-2.5 (3) MG/3ML IN SOLN
3.0000 mL | Freq: Four times a day (QID) | RESPIRATORY_TRACT | Status: DC
  Administered 2014-03-06 – 2014-03-08 (×9): 3 mL via RESPIRATORY_TRACT
  Filled 2014-03-06 (×9): qty 3

## 2014-03-06 MED ORDER — BENZONATATE 100 MG PO CAPS
100.0000 mg | ORAL_CAPSULE | Freq: Three times a day (TID) | ORAL | Status: DC | PRN
  Filled 2014-03-06: qty 1

## 2014-03-06 MED ORDER — FLUTICASONE PROPIONATE 50 MCG/ACT NA SUSP
2.0000 | Freq: Two times a day (BID) | NASAL | Status: DC
  Administered 2014-03-06 – 2014-03-08 (×4): 2 via NASAL
  Filled 2014-03-06: qty 16

## 2014-03-06 MED ORDER — ALBUTEROL SULFATE (2.5 MG/3ML) 0.083% IN NEBU: 2.5000 mg | INHALATION_SOLUTION | RESPIRATORY_TRACT | Status: DC | PRN

## 2014-03-06 MED ORDER — FUROSEMIDE 10 MG/ML IJ SOLN
120.0000 mg | Freq: Two times a day (BID) | INTRAVENOUS | Status: DC
  Administered 2014-03-06 – 2014-03-07 (×2): 120 mg via INTRAVENOUS
  Filled 2014-03-06 (×5): qty 12

## 2014-03-06 MED ORDER — INSULIN ASPART 100 UNIT/ML ~~LOC~~ SOLN
0.0000 [IU] | Freq: Three times a day (TID) | SUBCUTANEOUS | Status: DC
  Administered 2014-03-06: 4 [IU] via SUBCUTANEOUS
  Administered 2014-03-06 – 2014-03-07 (×2): 3 [IU] via SUBCUTANEOUS
  Administered 2014-03-07: 7 [IU] via SUBCUTANEOUS
  Administered 2014-03-08: 3 [IU] via SUBCUTANEOUS
  Administered 2014-03-08: 7 [IU] via SUBCUTANEOUS

## 2014-03-06 NOTE — Progress Notes (Signed)
Additional order for novolog 12 units placed at 2128 pm; [facility] notified and ordered not to give the additional 12 units because pt had just been covered by 20 units. Also, order for iv Lasix placed at 2135 pm; Pt refused to take Lasix.

## 2014-03-06 NOTE — Progress Notes (Signed)
[place] TEAM 1 - Stepdown/ICU TEAM Progress Note  Tamara Silva [id number] DOB: 01-04-1961 DOA: 03/04/2014 PCP: Chari Manning, NP  Admit HPI / Brief Narrative: 53 yo F Hx Morbid obesity, chronic resp failure on 2L O2, COPD, chronic mixed CHF, coronary artery disease status post stenting, diabetes type 2 uncontrolled, and prior PE who presented with acute resp failure with hypoxia. Patient reported 2 days of progressive increased work of breathing, cough, wheezing, positive sputum production. Denied chest pain. No abdominal pain. Denied worsening lower extremity edema.    Presented to the ER with MAXIMUM TEMPERATURE 100.3, heart rate in the 80s-100s, respirations 20s to 30s, blood pressure in the 120s to 170s over 60s to 90s. Satting greater than 94% on 3 L nasal cannula. ABG indicative of a mildly decompensated chronic respiratory acidosis. Chest x-ray noted new small amount of fluid within the minor fissure. Otherwise stable chest x-ray.  EKG was sinus rhythm. Heart rate 99. QTC mildly elevated at 515 milliseconds.   HPI/Subjective: Pt feels somewhat better this morning, but is not yet at her baseline.  She swears she is compliant with her medications, but admits to smoking cigarettes.  I also suspect dietary noncompliance.  She instructs that her medical hx NOT be discussed with anyone.    Assessment/Plan:   Acute on chronic resp failure with hypoxia/COPD  -Likely multifactorial with contributions of COPD and acute on chronic mixed CHF -no infiltrate on imaging  -CT angiogram negative for PE   -in that pt afebrile, w/ normal WBC, and no infiltrate on CT chest, will stop tx for PNA and focus on CHF, and attempt to control contribution from COPD w/o steroids  -Scheduled duo nebs  -IV Lasix - watch urine output closely  History PE Aug 2014 -Not on chronic anti-coagulation at this point   Chronic systolic and diastolic CHF  -May [id number] echocardiogram; EF of 35-40% and grade 2 diastolic  dysfunction.  -Myoview April 2015 low risk with severe LV dysfunction and no evidence of ischemia  -Repeat 2-D echo given recurrent admissions for above pending  -Strict ins and outs; since admission net - 2.6 liters -Daily weight, admission weight =143 kg - wgt now down to 142kg  -Lasix IV 80 mg BID (home dose)    CAD w/ DES May 2015 -Prasugrel 10 mg daily -Asymptomatic  Borderline QTc on admit EKG -QTC today improved/stable at 424  HTN  -BP within AHA/ADA guidelines -Aspirin 81 mg daily  -Coreg 12.5 mg  BID -Lisinopril 10 mg daily  Diabetes type 2 uncontrolled -CBG poorly controlled - stop steroids - follow trend  -9/5 A1c 8.9   HLD  -LDL slightly elevated above ADA guidelines -Increase Pravachol 60 mg daily    Bipolar disorder/PTSD/anxiety -Continue valium 5 mg TID PRN  -Paliperidone 156 mg injected on the 16th of every month    Nicotine abuse -Again counseled patient on extreme importance of cessation  Morbid obesity - Body mass index is 49.02 kg/(m^2).  Code Status: FULL Family Communication: no family present at time of exam Disposition Plan: transfer to CHF floor - PT/OT - cont to diurese  Consultants: NA  Procedure/Significant Events: 10/5 CT angiogram chest Mild changes CHF superimposed on COPD -No acute pulmonary emboli seen - Stable chronic RLL pulmonary artery occlusion, due to previous PE - Mildly increased bilateral atelectasis with stable underlying scarring.   Antibiotics: Levofloxacin 10/5 > 10/7  DVT prophylaxis: Subcutaneous heparin  Objective: Blood pressure 104/54, pulse 58, temperature 97.3 F (36.3 C), temperature  source Oral, resp. rate 22, height 5\' 7"  (1.702 m), weight 142 kg (313 lb 0.9 oz), last menstrual period 05/30/2013, SpO2 100.00%.  Intake/Output Summary (Last 24 hours) at 03/06/14 0901 Last data filed at 03/06/14 [id number]  Gross per 24 hour  Intake    930 ml  Output   1650 ml  Net   -720 ml   Exam: General: No acute  respiratory distress at rest in bed  Lungs: distant bs th/o - no wheeze - mild bibasilar crackles  Cardiovascular: distant heart sounds - RRR - no appreciable M, G, R Abdomen: morbidly obese, nontender, nondistended, soft, bowel sounds positive, no rebound, no appreciable mass Extremities: No significant cyanosis, clubbing;  2+ edema bilateral lower extremities  Data Reviewed: Basic Metabolic Panel:  Recent Labs Lab 03/04/14 1830 03/04/14 2152 03/05/14 0530 03/05/14 1817 03/06/14 0347  NA 138  --  136*  --  137  K 4.1  --  4.8  --  4.9  CL 100  --  97  --  98  CO2 30  --  28  --  27  GLUCOSE 144*  --  300* 434* 248*  BUN 11  --  16  --  32*  CREATININE 0.83 0.76 0.78  --  0.95  CALCIUM 8.7  --  8.6  --  8.9   Liver Function Tests:  Recent Labs Lab 03/05/14 0530 03/06/14 0347  AST 11 10  ALT 13 13  ALKPHOS 66 63  BILITOT 0.2* <0.2*  PROT 7.9 7.8  ALBUMIN 2.8* 2.8*   CBC:  Recent Labs Lab 03/04/14 1830 03/04/14 2152 03/05/14 0530 03/06/14 0347  WBC 9.2 9.2 7.4 8.8  NEUTROABS 5.7  --  6.2 7.1  HGB 10.0* 10.0* 9.9* 9.6*  HCT 32.0* 32.0* 32.1* 31.2*  MCV 80.4 78.2 78.5 79.2  PLT 270 268 258 270   Cardiac Enzymes:  Recent Labs Lab 03/04/14 2153 03/05/14 0940  TROPONINI <0.30 <0.30   BNP (last 3 results)  Recent Labs  02/01/14 1616 02/06/14 1727 03/04/14 1830  PROBNP 371.1* 405.8* 969.3*   CBG:  Recent Labs Lab 03/05/14 1816 03/05/14 2049 03/06/14 0020 03/06/14 0409 03/06/14 0822  GLUCAP 432* 426* 376* 204* 161*    Recent Results (from the past 240 hour(s))  CULTURE, BLOOD (ROUTINE X 2)     Status: None   Collection Time    03/04/14  8:35 PM      Result Value Ref Range Status   Specimen Description BLOOD ARM LEFT   Final   Special Requests BOTTLES DRAWN AEROBIC AND ANAEROBIC 10CC   Final   Culture  Setup Time     Final   Value: 03/05/2014 02:02     Performed at [facility]   Culture     Final   Value:        BLOOD CULTURE  RECEIVED NO GROWTH TO DATE CULTURE WILL BE HELD FOR 5 DAYS BEFORE ISSUING A FINAL NEGATIVE REPORT     Performed at [facility]   Report Status PENDING   Incomplete  CULTURE, BLOOD (ROUTINE X 2)     Status: None   Collection Time    03/04/14  8:45 PM      Result Value Ref Range Status   Specimen Description BLOOD ARM RIGHT   Final   Special Requests BOTTLES DRAWN AEROBIC ONLY 10CC   Final   Culture  Setup Time     Final   Value: 03/05/2014 02:06  Performed at [facility]     Final   Value:        BLOOD CULTURE RECEIVED NO GROWTH TO DATE CULTURE WILL BE HELD FOR 5 DAYS BEFORE ISSUING A FINAL NEGATIVE REPORT     Performed at [facility]   Report Status PENDING   Incomplete  MRSA PCR SCREENING     Status: None   Collection Time    03/05/14 12:06 AM      Result Value Ref Range Status   MRSA by PCR NEGATIVE  NEGATIVE Final   Comment:            The GeneXpert MRSA Assay (FDA     approved for NASAL specimens     only), is one component of a     comprehensive MRSA colonization     surveillance program. It is not     intended to diagnose MRSA     infection nor to guide or     monitor treatment for     MRSA infections.     Studies:  Recent x-ray studies have been reviewed in detail by the Attending Physician  Scheduled Meds:  Scheduled Meds: . antiseptic oral rinse  7 mL Mouth Rinse BID  . aspirin EC  81 mg Oral Daily  . carvedilol  12.5 mg Oral BID WC  . furosemide  80 mg Intravenous BID  . heparin  5,000 Units Subcutaneous 3 times per day  . insulin aspart  0-20 Units Subcutaneous 6 times per day  . insulin aspart  12 Units Subcutaneous Once  . insulin detemir  35 Units Subcutaneous QHS  . ipratropium-albuterol  3 mL Nebulization Q4H  . levofloxacin  750 mg Intravenous Q24H  . lisinopril  10 mg Oral Daily  . methylPREDNISolone (SOLU-MEDROL) injection  125 mg Intravenous Q12H  . pantoprazole  80 mg Oral Q1200  . prasugrel  10 mg Oral  Daily  . pravastatin  60 mg Oral QPM  . sodium chloride  3 mL Intravenous Q12H  . sodium chloride  3 mL Intravenous Q12H    Time spent on care of this patient: 35 mins  Cherene Altes, MD [facility] For Consults/Admissions - Flow Manager - [phone] Office  [phone] Pager [phone]  On-Call/Text Page:      [email or web address]      password [facility]  03/06/2014, 9:01 AM   LOS: 2 days

## 2014-03-06 NOTE — Progress Notes (Signed)
Patient has arrived to [facility] room 3E29. Patient alert and oriented x4 and has no complaints at this time. Patient placed on telemetry, [place] notified. Will continue to monitor patient.

## 2014-03-07 DIAGNOSIS — I5023 Acute on chronic systolic (congestive) heart failure: Secondary | ICD-10-CM

## 2014-03-07 LAB — BASIC METABOLIC PANEL
Anion gap: 11 (ref 5–15)
BUN: 40 mg/dL — ABNORMAL HIGH (ref 6–23)
CO2: 32 mEq/L (ref 19–32)
Calcium: 9.1 mg/dL (ref 8.4–10.5)
Chloride: 99 mEq/L (ref 96–112)
Creatinine, Ser: 1.29 mg/dL — ABNORMAL HIGH (ref 0.50–1.10)
GFR calc Af Amer: 53 mL/min — ABNORMAL LOW (ref >90)
GFR calc non Af Amer: 46 mL/min — ABNORMAL LOW (ref >90)
Glucose, Bld: 106 mg/dL — ABNORMAL HIGH (ref 70–99)
Potassium: 4.3 mEq/L (ref 3.7–5.3)
Sodium: 142 mEq/L (ref 137–147)

## 2014-03-07 LAB — GLUCOSE, CAPILLARY
GLUCOSE-CAPILLARY: 266 mg/dL — AB (ref 70–99)
Glucose-Capillary: 100 mg/dL — ABNORMAL HIGH (ref 70–99)
Glucose-Capillary: 145 mg/dL — ABNORMAL HIGH (ref 70–99)
Glucose-Capillary: 290 mg/dL — ABNORMAL HIGH (ref 70–99)

## 2014-03-07 MED ORDER — FUROSEMIDE 80 MG PO TABS
80.0000 mg | ORAL_TABLET | Freq: Two times a day (BID) | ORAL | Status: DC
  Administered 2014-03-07 – 2014-03-08 (×2): 80 mg via ORAL
  Filled 2014-03-07 (×4): qty 1

## 2014-03-07 MED ORDER — INSULIN DETEMIR 100 UNIT/ML ~~LOC~~ SOLN
42.0000 [IU] | Freq: Every day | SUBCUTANEOUS | Status: DC
  Administered 2014-03-07: 42 [IU] via SUBCUTANEOUS
  Filled 2014-03-07 (×2): qty 0.42

## 2014-03-07 NOTE — Progress Notes (Signed)
Nutrition Education Note  RD consulted for nutrition education regarding CHF.  Pt states she already follows a low sodium diet and reads nutrition labels on everything. She was up for doing some review of low sodium diet.  RD provided "Low Sodium Nutrition Therapy" handout from the Academy of [employer] and Dietetics. Reviewed patient's dietary recall and provided examples on ways to decrease sodium intake in diet. Discouraged intake of processed foods and use of salt shaker. Encouraged fresh fruits and vegetables as well as whole grain sources of carbohydrates to maximize fiber intake.   RD emphasized the importance of adhering to diet recommendations, and emphasized the role of fluids and foods to avoid. Provided thirst-quenching tips. Teach back method used.  Expect good compliance.  Body mass index is 48.43 kg/(m^2). Pt meets criteria for M based on current BMI.  Current diet order is Heart Healthy/Carb Modified, patient is consuming approximately 100% of meals at this time. Labs and medications reviewed. No further nutrition interventions warranted at this time. RD contact information provided. If additional nutrition issues arise, please re-consult RD.   Pryor Ochoa RD, LDN Inpatient Clinical Dietitian Pager: [phone] After Hours Pager: [phone]

## 2014-03-07 NOTE — Progress Notes (Signed)
Report given to receiving RN. Patient in bed resting. No verbal complaints and no signs or symptoms of distress or discomfort.  

## 2014-03-07 NOTE — Consult Note (Signed)
Advanced Heart Failure Team Consult Note  Referring Physician: Dr Clementeen Graham Primary Physician: None Primary Cardiologist:  Dr Ellyn Hack  Reason for Consultation: Heart Failure   HPI:   The Heart Failure Team was asked to consult for heart failure by Dr Clementeen Graham.   Tamara Silva is a 53 year old with morbid obesity, COPD (stopped smoking 1/15), DM2, CAD with history of PCI at [facility] in [facility] 2009 (likely to the LAD), hypertension, hyperlipidemia, H/O chronic LLL PE 2014 previously on xarelto for about 7 month and systolic HF with EF 123456. We are asked to consult due to HF.   She has had 3 admits in the last 6 months.   Admitted Oct 06, 2013 with STEMI of inferior wall and had Xience Alpine DES 2.75 mm x 15 mm to the RCA. Initially on plavix but P2Y12 test showed poor response and she was switched to effient. Hospital course was complicated by bradycardia. Carvedilol was cut back due to bradycardia.    Admitted February 06, 2014 with dyspnea thought to be secondary to COPD exacerbation.   Admitted 10/5 with increased  Dyspnea and hypoxia. CT angio was negative for acute PE - there was mild bullous emphysema and chronic RLL PE .CXR was stable. CEs negative.  Pro BNP was 969. Diuresed with IV lasix. Overall she diuresed 6 pounds. Breathing better.  Says she watches her sodium but drinks a lot of fluid/ice. Does not have scale. Snores heavily but never had sleeps study. Denies CP. Does take extra lasix when she swells.   Studies  ECHO  07/01/2013: EF 30-40% with mildly dilated LV and mild LVH. Mild HK of mid to distal anteroseptal myocardium. Pseudomonal filling the grade 3 diastolic dysfunction.  Cardiolite 08/29/2013: Extensive LAD distribution scar with severe systolic dysfunction. No reversible ischemia. EF roughly 28% LHC 10/07/13  With severe single-vessel disease with distal RCA 99% thrombotic subtotal occlusion, successful PCI of the distal RCA with a Xience Alpine 2.75 mm x 59mm  postdilated to 3.1 mm Mild in-stent restenosis in the LAD stent. Dilated Left Ventricle with global hypokinesis and EF of roughly 30%.    Review of Systems: [y] = yes, [ ]  = no   General: Weight gain [ ] ; Weight loss [ ] ; Anorexia [ ] ; Fatigue [ Y]; Fever [ ] ; Chills [ ] ; Weakness [ ]   Cardiac: Chest pain/pressure [ ] ; Resting SOB [ ] ; Exertional SOB [Y ]; Orthopnea [ ] ; Pedal Edema [ Y]; Palpitations [ ] ; Syncope [ ] ; Presyncope [ ] ; Paroxysmal nocturnal dyspnea[ ]   Pulmonary: Cough [ ] ; Wheezing[ ] ; Hemoptysis[ ] ; Sputum [ ] ; Snoring [ ]   GI: Vomiting[ ] ; Dysphagia[ ] ; Melena[ ] ; Hematochezia [ ] ; Heartburn[ ] ; Abdominal pain [ ] ; Constipation [ ] ; Diarrhea [ ] ; BRBPR [ ]   GU: Hematuria[ ] ; Dysuria [ ] ; Nocturia[ ]   Vascular: Pain in legs with walking [ ] ; Pain in feet with lying flat [ ] ; Non-healing sores [ ] ; Stroke [ ] ; TIA [ ] ; Slurred speech [ ] ;  Neuro: Headaches[ ] ; Vertigo[ ] ; Seizures[ ] ; Paresthesias[ ] ;Blurred vision [ ] ; Diplopia [ ] ; Vision changes [ ]   Ortho/Skin: Arthritis [ ] ; Joint pain [Y ]; Muscle pain [ ] ; Joint swelling [ ] ; Back Pain [ ] ; Rash [ ]   Psych: Depression[ ] ; Anxiety[ ]   Heme: Bleeding problems [ ] ; Clotting disorders [ ] ; Anemia [ ]   Endocrine: Diabetes [ ] ; Thyroid dysfunction[ ]   Home Medications Prior to Admission medications   Medication  Sig Start Date End Date Taking? Authorizing Provider  albuterol (PROVENTIL HFA;VENTOLIN HFA) 108 (90 BASE) MCG/ACT inhaler Inhale 2 puffs into the lungs every 6 (six) hours as needed for wheezing or shortness of breath.  06/29/13  Yes Dorie Rank, MD  albuterol (PROVENTIL) (2.5 MG/3ML) 0.083% nebulizer solution Take 6 mLs (5 mg total) by nebulization every 4 (four) hours as needed for wheezing or shortness of breath. 06/24/13  Yes Jennifer L Piepenbrink, PA-C  aspirin 81 MG tablet Take 1 tablet (81 mg total) by mouth daily. 10/09/13  Yes Rhonda G Barrett, PA-C  benzonatate (TESSALON) 100 MG capsule Take 1 capsule (100 mg  total) by mouth 3 (three) times daily as needed for cough. 02/08/14  Yes Kelvin Cellar, MD  carvedilol (COREG) 25 MG tablet Take 12.5 mg by mouth 2 (two) times daily with a meal.    Yes Historical Provider, MD  cycloSPORINE (RESTASIS) 0.05 % ophthalmic emulsion Place 2 drops into both eyes 2 (two) times daily as needed (for dry eyes).    Yes Historical Provider, MD  diazepam (VALIUM) 10 MG tablet Take 0.5 tablets (5 mg total) by mouth every 8 (eight) hours as needed for anxiety. 07/16/13  Yes Allie Bossier, MD  esomeprazole (NEXIUM) 40 MG capsule Take 40 mg by mouth 2 (two) times daily.   Yes Historical Provider, MD  fluticasone (FLONASE) 50 MCG/ACT nasal spray Place 2 sprays into both nostrils 2 (two) times daily.    Yes Historical Provider, MD  furosemide (LASIX) 40 MG tablet Take 2 tablets (80 mg total) by mouth 2 (two) times daily. 10/09/13  Yes Rhonda G Barrett, PA-C  Glucose Blood (ACCU-CHEK ADVANTAGE TEST VI) 1 each by Other route See admin instructions. Check blood sugar 4 times daily.   Yes Historical Provider, MD  insulin aspart (NOVOLOG) 100 UNIT/ML injection Inject 15-20 Units into the skin 3 (three) times daily before meals. Sliding scale   Yes Historical Provider, MD  insulin detemir (LEVEMIR) 100 UNIT/ML injection Inject 35 Units into the skin at bedtime.    Yes Historical Provider, MD  lisinopril (PRINIVIL,ZESTRIL) 10 MG tablet Take 1 tablet (10 mg total) by mouth daily. 10/09/13  Yes Rhonda G Barrett, PA-C  nitroGLYCERIN (NITROSTAT) 0.4 MG SL tablet Place 1 tablet (0.4 mg total) under the tongue every 5 (five) minutes as needed for chest pain. 03/01/14  Yes Brett Canales, PA-C  omeprazole (PRILOSEC) 40 MG capsule Take 40 mg by mouth 2 (two) times daily.   Yes Historical Provider, MD  Paliperidone Palmitate (INVEGA SUSTENNA) 117 MG/0.75ML SUSP Inject 156 mg into the muscle every 30 (thirty) days. One injection monthly. On the 16th of every month   Yes Historical Provider, MD  potassium  chloride (K-DUR) 10 MEQ tablet Take 10 mEq by mouth 2 (two) times daily.    Yes Historical Provider, MD  prasugrel (EFFIENT) 10 MG TABS tablet Take 1 tablet (10 mg total) by mouth daily. 10/09/13  Yes Rhonda G Barrett, PA-C  pravastatin (PRAVACHOL) 40 MG tablet Take 40 mg by mouth every evening.    Yes Historical Provider, MD  predniSONE (STERAPRED UNI-PAK) 10 MG tablet Take 6-5-4-3-2-1 tablets by mouth daily till gone. 02/08/14  Yes Kelvin Cellar, MD  levofloxacin (LEVAQUIN) 500 MG tablet Take 1 tablet (500 mg total) by mouth daily. 02/08/14   Kelvin Cellar, MD    Past Medical History: Past Medical History  Diagnosis Date  . Diabetes mellitus without complication   . PE (pulmonary embolism)   .  Hypercholesterolemia   . Hypertension   . COPD (chronic obstructive pulmonary disease)   . Asthma   . Coronary artery disease   . CHF (congestive heart failure)   . Mental disorder     BIPOLAR  . Shortness of breath   . GERD (gastroesophageal reflux disease)   . Neuromuscular disorder     DIABETIC NEUROPATHY  . Ischemic cardiomyopathy   . Bipolar disorder   . Post-traumatic stress syndrome   . On home oxygen therapy     "2L most all the time" (02/06/2014)    Past Surgical History: Past Surgical History  Procedure Laterality Date  . Coronary angioplasty with stent placement      CAD in 2006 x 2 and 2009 2 more- place din REx in [place] [facility]  . Esophagogastroduodenoscopy (egd) with propofol N/A 08/03/2013    Procedure: ESOPHAGOGASTRODUODENOSCOPY (EGD) WITH PROPOFOL;  Surgeon: Jeryl Columbia, MD;  Location: [facility];  Service: Endoscopy;  Laterality: N/A;  . Colonoscopy with propofol N/A 08/03/2013    Procedure: COLONOSCOPY WITH PROPOFOL;  Surgeon: Jeryl Columbia, MD;  Location: [facility];  Service: Endoscopy;  Laterality: N/A;  . Coronary angioplasty with stent placement  10/07/2013    Xience Alpine DES 2.75  mm x 15  mm    Family History: Family History  Problem Relation Age  of Onset  . Stroke Mother   . Cancer Father     Social History: History   Social History  . Marital Status: Single    Spouse Name: N/A    Number of Children: N/A  . Years of Education: N/A   Social History Main Topics  . Smoking status: Former Smoker    Types: Cigarettes    Quit date: 11/24/2012  . Smokeless tobacco: Never Used  . Alcohol Use: No  . Drug Use: No  . Sexual Activity: No   Other Topics Concern  . None   Social History Narrative   Origibnally from [place]   Most recently from [place]   Daughter lives in town      On Social security to CHF, COPD, CAD    Allergies:  Allergies  Allergen Reactions  . Metolazone Other (See Comments)    Dizziness and falling  . Plavix [Clopidogrel Bisulfate] Other (See Comments)    High PRU's - non-responder  . Nsaids Other (See Comments)    "I do not take NSAIDs d/t interfering with other meds"    Objective:    Vital Signs:   Temp:  [97.3 F (36.3 C)-98.4 F (36.9 C)] 98.4 F (36.9 C) (10/08 0553) Pulse Rate:  [56-68] 66 (10/08 0553) Resp:  [16-21] 20 (10/08 0553) BP: (95-116)/(53-70) 116/56 mmHg (10/08 1039) SpO2:  [92 %-100 %] 98 % (10/08 0828) Weight:  [309 lb 4.9 oz (140.3 kg)-313 lb 4.4 oz (142.1 kg)] 309 lb 4.9 oz (140.3 kg) (10/08 0553) Last BM Date: 03/03/14  Weight change: Filed Weights   03/06/14 0300 03/06/14 1345 03/07/14 0553  Weight: 313 lb 0.9 oz (142 kg) 313 lb 4.4 oz (142.1 kg) 309 lb 4.9 oz (140.3 kg)    Intake/Output:   Intake/Output Summary (Last 24 hours) at 03/07/14 1137 Last data filed at 03/07/14 0518  Gross per 24 hour  Intake   1200 ml  Output   2025 ml  Net   -825 ml     Physical Exam: General:  Morbidly obese. Well appearing. No resp difficulty HEENT: normal Neck: supple. Jhick unable to see JVD .  Carotids 2+ bilat; no bruits. No lymphadenopathy or thryomegaly appreciated. Cor: PMI nonpalpable Regular rate & rhythm. 2/6 TR Lungs: clear with decreased BS  throughout Abdomen: obese, soft, nontender, nondistended. No hepatosplenomegaly. No bruits or masses. Good bowel sounds. Extremities: no cyanosis, clubbing, rash, R and LLE 1+ edema Neuro: alert & orientedx3, cranial nerves grossly intact. moves all 4 extremities w/o difficulty. Affect pleasant  Telemetry: Sinus  Labs: Basic Metabolic Panel:  Recent Labs Lab 03/04/14 1830 03/04/14 2152 03/05/14 0530 03/05/14 1817 03/06/14 0347 03/07/14 0540  NA 138  --  136*  --  137 142  K 4.1  --  4.8  --  4.9 4.3  CL 100  --  97  --  98 99  CO2 30  --  28  --  27 32  GLUCOSE 144*  --  300* 434* 248* 106*  BUN 11  --  16  --  32* 40*  CREATININE 0.83 0.76 0.78  --  0.95 1.29*  CALCIUM 8.7  --  8.6  --  8.9 9.1    Liver Function Tests:  Recent Labs Lab 03/05/14 0530 03/06/14 0347  AST 11 10  ALT 13 13  ALKPHOS 66 63  BILITOT 0.2* <0.2*  PROT 7.9 7.8  ALBUMIN 2.8* 2.8*   No results found for this basename: LIPASE, AMYLASE,  in the last 168 hours No results found for this basename: AMMONIA,  in the last 168 hours  CBC:  Recent Labs Lab 03/04/14 1830 03/04/14 2152 03/05/14 0530 03/06/14 0347  WBC 9.2 9.2 7.4 8.8  NEUTROABS 5.7  --  6.2 7.1  HGB 10.0* 10.0* 9.9* 9.6*  HCT 32.0* 32.0* 32.1* 31.2*  MCV 80.4 78.2 78.5 79.2  PLT 270 268 258 270    Cardiac Enzymes:  Recent Labs Lab 03/04/14 2153 03/05/14 0940  TROPONINI <0.30 <0.30    BNP: BNP (last 3 results)  Recent Labs  02/01/14 1616 02/06/14 1727 03/04/14 1830  PROBNP 371.1* 405.8* 969.3*    CBG:  Recent Labs Lab 03/06/14 0822 03/06/14 1210 03/06/14 1606 03/06/14 2126 03/07/14 0621  GLUCAP 161* 189* 148* 228* 100*    Coagulation Studies: No results found for this basename: LABPROT, INR,  in the last 72 hours  Other results: EKG: Sinus 66 with inferior Qs. + PVCs  Imaging:  No results found.   Medications:     Current Medications: . antiseptic oral rinse  7 mL Mouth Rinse BID  .  aspirin EC  81 mg Oral Daily  . carvedilol  12.5 mg Oral BID WC  . fluticasone  2 spray Each Nare BID  . furosemide  120 mg Intravenous BID  . heparin  5,000 Units Subcutaneous 3 times per day  . insulin aspart  0-20 Units Subcutaneous TID WC  . insulin detemir  35 Units Subcutaneous QHS  . ipratropium-albuterol  3 mL Nebulization QID  . levofloxacin  750 mg Intravenous Q24H  . lisinopril  10 mg Oral Daily  . pantoprazole  80 mg Oral Q1200  . prasugrel  10 mg Oral Daily  . pravastatin  60 mg Oral QPM     Infusions:      Assessment:  1. A/C Respiratory Failure 2. CAD- STEMI 09/2013--> DES to RCA                 Prior stent to LAD 2006  3. Acute/Chronic Systolic Heart Failure ICM ECHO 09/2013 EF 35-40%  4.  COPD 5. Obesity, morbid 6. Uncontrolled DM  7.  H/O PE 2014 previously on Xarelto for ~ 7 months       --chronic RLL filling defect on CT 8. HTN  9. Snores ? Sleep Apnea.    Plan/Discussion:     Will need outpatient follow with pulmonary for sleep study and also for COPD.   Referred to HF navigator. Refer to cardiac rehab and dietitian.   Length of Stay: 3  CLEGG,AMY NMP-C  03/07/2014, 11:37 AM  Advanced Heart Failure Team Pager [phone] (M-F; 7a - 4p)  Please contact [facility] for night-coverage after hours (4p -7a ) and weekends on [email or web address]   Patient seen and examined with Darrick Grinder, NP. We discussed all aspects of the encounter. I agree with the assessment and plan as stated above.   I suspect Tamara. Boline dyspnea is likely due mainly to volume overload on top of her chronic respiratory failure due to COPD, OHS, morbid obesity and chronic RLE PE. Volume status now improved. Can switch to lasix 80 po bid.    In talking to her she seems like she has a fairly good understanding of her meds and also tries to follow a low-salt diet but she drinks lots of fluids and does not have a scale. She will need extensive HF education  Plan for now: 1. Change  lasix to 80 po bid. 2. Check VQ scan to evaluate extent of chronic PE 3. With have HF navigator see tomorrow to give her a scale and provide HF education 4. Enroll in paramedicine program 5. Schedule outpatient sleep study  6. Can consider RHC as needed, particularly if she has significant chronic PE burden 7. Have PT assess for home O2 need  We will follow.   Daniel Bensimhon,MD 4:08 PM

## 2014-03-07 NOTE — Care Management Note (Addendum)
    Page 1 of 1   03/07/2014     11:54:58 AM CARE MANAGEMENT NOTE 03/07/2014  Patient:  [facility]   Account Number:  [id number]  Date Initiated:  03/07/2014  Documentation initiated by:  [facility]  Subjective/Objective Assessment:   53 yo in ER with MAXIMUM TEMPERATURE 100.3, HR 80s to 100s, respirations and 20s to 30s, B/P 120s to 170s over 60s to 90s. > 94% on 3 L nasal cannula. //Home with friends     Action/Plan:   IV Levaquin, IV Solu-Medrol, nebs.// Access for disposition needs.   Anticipated DC Date:  03/09/2014   Anticipated DC Plan:  [facility]  CM consult  [facility]      [facility] Choice  HOME HEALTH  Resumption Of Svcs/PTA Provider   Choice offered to / List presented to:             [facility].   Status of service:  In process, will continue to follow Medicare Important Message given?   (If response is "NO", the following Medicare IM given date fields will be blank) Date Medicare IM given:   Medicare IM given by:   Date Additional Medicare IM given:   Additional Medicare IM given by:    Discharge Disposition:    Per UR Regulation:  Reviewed for med. necessity/level of care/duration of stay  If discussed at [place] of Stay Meetings, dates discussed:    Comments:  03/07/14 [place], RN, BSN, [place] Pt states she has [facility] for "mental help"; NCM called to confirm and representative states that they do not have a pt by that name. Researched pt last visit to find pt was set up with [facility] for services.  Spoke with Janae Sauce, RN of [facility] to confirm pt active with [facility]. Butch Penny states they they have made attempts to visit pt and pt refuses each time.  NCM will follow up with pt again. Heart Failure Team consulted via Dr. Clementeen Graham.

## 2014-03-07 NOTE — Progress Notes (Signed)
[facility] PROGRESS NOTE  Tamara Silva [id number] DOB: May 21, 1961 DOA: 03/04/2014 PCP: Chari Manning, NP   Brief negative 53 year old morbidly obese female with history of COPD with chronic respiratory failure on 2 L oxygen at home,  chronic systolic and diastolic CHF, NYHA class II with EF of 30-35%, CAD with STEMI in 09/2013 status post RCA stent, hypertension, type 2 diabetes mellitus, normocytic anemia, bipolar disorder who was admitted to step down with acute hypoxic respiratory failure. Patient had two-day history of progressive shortness of breath with productive cough and wheezing.  In the ED patient will have low-grade fever of 100.93F, tachypnic and O2 sat 94% on 3 L via nasal cannula. ABG showed mildly decompensated chronic respiratory acidosis. Chest x-ray showed new small amount of fluid within the minor fissure. Patient admitted for acute hypoxic respiratory failure secondary to CHF and COPD exacerbation, placed on aggressive IV diuresis, nebulizers and transferred to telemetry.  Assessment/Plan: Acute on chronic hypoxic respiratory failure Secondary to CHF exacerbation and COPD. 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 Continue scheduled DuoNeb. Good discontinued given uncontrolled blood glucose. Placed on lasix drip on 10/7 and diuresing well with a negative balance of 4.5L so far. Has lost 3 kgs since admission. -Repeat 2-D echo shows EF of 35-40% with moderate hypokinesis. -Seen by heart failure team today and switched to oral Lasix 80 mg twice a day (which is her home dose) -Monitor daily weight and I /O -  does not appear to have good understanding of her disease and following up as outpatient. -I strongly doubt she is compliant to her diet although she reports monitoring her sodium intake. -Appreciate cardiology recommendations.  Ordered a VQ scan to see extent of her PE. -Heart failure team continued to follow and arrange outpatient followup.    Hx of PE in 2014 The lumbar on anticoagulation. CT angiogram shows possible chronic PE on the right lung. V/Q scan to evaluate extent of the PE.  Coronary artery disease with history of TEMI in 09/2013 s/p DES Continue prasugrel, aspirin, Coreg and statin  Uncontrolled type 2 diabetes mellitus Poor dietary adherence  and medication compliance. A1c of 8.9.  We'll increase Lantus to 42 units daily. continue sliding scale insulin  Hypertension Blood pressure stable. Continue lisinopril and Coreg  Bipolar disorder/PTSD/anxiety  -Continue valium 5 mg TID PRN  -Paliperidone 156 mg injected on the 16th of every month    Nicotine abuse  -Counseled strongly on smoking cessation.   Morbid obesity - Body mass index is 49.02 kg/(m^2).  -Needs outpatient followup. Patient plans to followup at the [facility] upon discharge. -Patient likely has symptoms of OHS/ OSA and would benefit from outpatient sleep study  Code Status: FULL    Family Communication: None at bedside  Disposition Plan: Home once stable  Consultants:  Heart failure team   DVT prophylaxis  Diet: Heart healthy/diabetic      HPI/Subjective: Patient reports her breathing to be better today.  Objective: Filed Vitals:   03/07/14 1312  BP: 99/58  Pulse: 74  Temp: 98.9 F (37.2 C)  Resp: 22    Intake/Output Summary (Last 24 hours) at 03/07/14 1523 Last data filed at 03/07/14 1314  Gross per 24 hour  Intake   1200 ml  Output   2125 ml  Net   -925 ml   Filed Weights   03/06/14 0300 03/06/14  1345 03/07/14 0553  Weight: 142 kg (313 lb 0.9 oz) 142.1 kg (313 lb 4.4 oz) 140.3 kg (309 lb 4.9 oz)    Exam:   General:  Middle aged morbidly obese female in no acute distress  HEENT: Moist oral mucosa  Cardiovascular: Normal S1 and S2, no murmurs  Respiratory: Diminished  breath sounds over bilateral lung bases, crackles or rhonchi  Abdomen: Soft, nontender, nondistended, bowel sounds present  Musculoskeletal: Warm, 1+ pitting edema bilaterally  Data Reviewed: Basic Metabolic Panel:  Recent Labs Lab 03/04/14 1830 03/04/14 2152 03/05/14 0530 03/05/14 1817 03/06/14 0347 03/07/14 0540  NA 138  --  136*  --  137 142  K 4.1  --  4.8  --  4.9 4.3  CL 100  --  97  --  98 99  CO2 30  --  28  --  27 32  GLUCOSE 144*  --  300* 434* 248* 106*  BUN 11  --  16  --  32* 40*  CREATININE 0.83 0.76 0.78  --  0.95 1.29*  CALCIUM 8.7  --  8.6  --  8.9 9.1   Liver Function Tests:  Recent Labs Lab 03/05/14 0530 03/06/14 0347  AST 11 10  ALT 13 13  ALKPHOS 66 63  BILITOT 0.2* <0.2*  PROT 7.9 7.8  ALBUMIN 2.8* 2.8*   No results found for this basename: LIPASE, AMYLASE,  in the last 168 hours No results found for this basename: AMMONIA,  in the last 168 hours CBC:  Recent Labs Lab 03/04/14 1830 03/04/14 2152 03/05/14 0530 03/06/14 0347  WBC 9.2 9.2 7.4 8.8  NEUTROABS 5.7  --  6.2 7.1  HGB 10.0* 10.0* 9.9* 9.6*  HCT 32.0* 32.0* 32.1* 31.2*  MCV 80.4 78.2 78.5 79.2  PLT 270 268 258 270   Cardiac Enzymes:  Recent Labs Lab 03/04/14 2153 03/05/14 0940  TROPONINI <0.30 <0.30   BNP (last 3 results)  Recent Labs  02/01/14 1616 02/06/14 1727 03/04/14 1830  PROBNP 371.1* 405.8* 969.3*   CBG:  Recent Labs Lab 03/06/14 1210 03/06/14 1606 03/06/14 2126 03/07/14 0621 03/07/14 1138  GLUCAP 189* 148* 228* 100* 145*    Recent Results (from the past 240 hour(s))  CULTURE, BLOOD (ROUTINE X 2)     Status: None   Collection Time    03/04/14  8:35 PM      Result Value Ref Range Status   Specimen Description BLOOD ARM LEFT   Final   Special Requests BOTTLES DRAWN AEROBIC AND ANAEROBIC 10CC   Final   Culture  Setup Time     Final   Value: 03/05/2014 02:02     Performed at [facility]   Culture     Final   Value:        BLOOD  CULTURE RECEIVED NO GROWTH TO DATE CULTURE WILL BE HELD FOR 5 DAYS BEFORE ISSUING A FINAL NEGATIVE REPORT     Performed at [facility]   Report Status PENDING   Incomplete  CULTURE, BLOOD (ROUTINE X 2)     Status: None   Collection Time    03/04/14  8:45 PM      Result Value Ref Range Status   Specimen Description BLOOD ARM RIGHT   Final   Special Requests BOTTLES DRAWN AEROBIC ONLY 10CC   Final   Culture  Setup Time     Final   Value: 03/05/2014 02:06     Performed at [facility]  Culture     Final   Value:        BLOOD CULTURE RECEIVED NO GROWTH TO DATE CULTURE WILL BE HELD FOR 5 DAYS BEFORE ISSUING A FINAL NEGATIVE REPORT     Performed at [facility]   Report Status PENDING   Incomplete  MRSA PCR SCREENING     Status: None   Collection Time    03/05/14 12:06 AM      Result Value Ref Range Status   MRSA by PCR NEGATIVE  NEGATIVE Final   Comment:            The GeneXpert MRSA Assay (FDA     approved for NASAL specimens     only), is one component of a     comprehensive MRSA colonization     surveillance program. It is not     intended to diagnose MRSA     infection nor to guide or     monitor treatment for     MRSA infections.     Studies: No results found.  Scheduled Meds: . antiseptic oral rinse  7 mL Mouth Rinse BID  . aspirin EC  81 mg Oral Daily  . carvedilol  12.5 mg Oral BID WC  . fluticasone  2 spray Each Nare BID  . furosemide  120 mg Intravenous BID  . heparin  5,000 Units Subcutaneous 3 times per day  . insulin aspart  0-20 Units Subcutaneous TID WC  . insulin detemir  35 Units Subcutaneous QHS  . ipratropium-albuterol  3 mL Nebulization QID  . levofloxacin  750 mg Intravenous Q24H  . lisinopril  10 mg Oral Daily  . pantoprazole  80 mg Oral Q1200  . prasugrel  10 mg Oral Daily  . pravastatin  60 mg Oral QPM   Continuous Infusions:     Time spent: 35 minutes    Yalanda Soderman, Joshua  [facility] Pager [phone]. If  7PM-7AM, please contact night-coverage at [email or web address], password [facility] 03/07/2014, 3:23 PM  LOS: 3 days

## 2014-03-08 ENCOUNTER — Encounter (HOSPITAL_COMMUNITY): Payer: Medicaid Other

## 2014-03-08 ENCOUNTER — Inpatient Hospital Stay (HOSPITAL_COMMUNITY): Payer: Medicaid Other

## 2014-03-08 DIAGNOSIS — J9621 Acute and chronic respiratory failure with hypoxia: Secondary | ICD-10-CM

## 2014-03-08 LAB — GLUCOSE, CAPILLARY
GLUCOSE-CAPILLARY: 202 mg/dL — AB (ref 70–99)
Glucose-Capillary: 143 mg/dL — ABNORMAL HIGH (ref 70–99)

## 2014-03-08 LAB — BASIC METABOLIC PANEL
ANION GAP: 10 (ref 5–15)
BUN: 28 mg/dL — AB (ref 6–23)
CALCIUM: 8.3 mg/dL — AB (ref 8.4–10.5)
CO2: 37 meq/L — AB (ref 19–32)
CREATININE: 1.16 mg/dL — AB (ref 0.50–1.10)
Chloride: 95 mEq/L — ABNORMAL LOW (ref 96–112)
GFR calc Af Amer: 61 mL/min — ABNORMAL LOW (ref >90)
GFR calc non Af Amer: 52 mL/min — ABNORMAL LOW (ref >90)
Glucose, Bld: 119 mg/dL — ABNORMAL HIGH (ref 70–99)
Potassium: 4.4 mEq/L (ref 3.7–5.3)
Sodium: 142 mEq/L (ref 137–147)

## 2014-03-08 MED ORDER — TECHNETIUM TO 99M ALBUMIN AGGREGATED
6.0000 | Freq: Once | INTRAVENOUS | Status: AC | PRN
  Administered 2014-03-08: 6 via INTRAVENOUS

## 2014-03-08 MED ORDER — TECHNETIUM TC 99M DIETHYLENETRIAME-PENTAACETIC ACID: 40.0000 | Freq: Once | INTRAVENOUS | Status: DC | PRN

## 2014-03-08 MED ORDER — ATORVASTATIN CALCIUM 40 MG PO TABS
40.0000 mg | ORAL_TABLET | Freq: Every day | ORAL | Status: DC
  Filled 2014-03-08: qty 1

## 2014-03-08 MED ORDER — INSULIN DETEMIR 100 UNIT/ML ~~LOC~~ SOLN: 42.0000 [IU] | Freq: Every day | SUBCUTANEOUS | Status: DC

## 2014-03-08 NOTE — Discharge Instructions (Signed)

## 2014-03-08 NOTE — Discharge Summary (Addendum)
Physician Discharge Summary  Tamara Silva [id number] DOB: 11-03-1960 DOA: 03/04/2014  PCP: Chari Manning, NP  Admit date: 03/04/2014 Discharge date: 03/08/2014  Time spent: 35 minutes  Recommendations for Outpatient Follow-up:  Discharge home with outpatient followup in the heart failure clinic and [facility] Patient instructed to call [facility] to schedule appt for oupt care  Discharge Diagnoses:  Principal Problem:   Acute on chronic systolic congestive heart failure   Active Problems:   Tobacco abuse   Chronic pulmonary embolism   Bipolar disorder   Acute posttraumatic stress syndrome   Essential hypertension   Hyperlipidemia   Hx of pulmonary embolus   Morbid obesity   Type 2 diabetes mellitus treated with insulin   COPD exacerbation   Acute respiratory failure with hypoxia   Diabetes   Discharge Condition: fair  Discharge weight: 306 lb   Diet recommendation: Heart healthy/ diabetic  Filed Weights   03/06/14 1345 03/07/14 0553 03/08/14 0403  Weight: 142.1 kg (313 lb 4.4 oz) 140.3 kg (309 lb 4.9 oz) 138.9 kg (306 lb 3.5 oz)    History of present illness:  53 year old morbidly obese female with history of COPD with chronic respiratory failure on 2 L oxygen at home, chronic systolic and diastolic CHF, NYHA class II with EF of 30-35%, CAD with STEMI in 09/2013 status post RCA stent, hypertension, type 2 diabetes mellitus, normocytic anemia, bipolar disorder who was admitted to step down with acute hypoxic respiratory failure. Patient had two-day history of progressive shortness of breath with productive cough and wheezing.  In the ED patient will have low-grade fever of 100.36F, tachypnic and O2 sat 94% on 3 L via nasal cannula. ABG showed mildly decompensated chronic respiratory acidosis. Chest x-ray showed new small amount of fluid within the minor fissure.  Patient admitted for acute hypoxic respiratory failure secondary to CHF and COPD  exacerbation, placed on aggressive IV diuresis, nebulizers and transferred to telemetry.   Hospital Course:  Acute on chronic hypoxic respiratory failure  Secondary to CHF exacerbation and mild COPD.  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 Continued on scheduled DuoNeb. -Steroid discontinued given uncontrolled blood glucose. Placed on lasix drip on 10/7 and diuresing well with a negative balance of 7 L  so far. Has lost 4 Kgs since admission.  -Repeat 2-D echo shows EF of 35-40% with moderate hypokinesis.  -Seen by heart failure team today and switched to oral Lasix 80 mg twice a day . - does not appear to have good understanding of her disease and following up as outpatient.  -I strongly doubt she is compliant to her diet although she reports monitoring her sodium intake.  -Appreciate cardiology recommendations. Ordered a VQ scan to see extent of her PE and shows low probability.  -Patient will be followed up in heart failure clinic next week. Also has appointment with pulmonary on 10/29.  Patient given heart failure booklet, weighing machine and educated on low sodium diet  COPD Continue home o2 and nebs counseled strongly on smoking cessation Off abx  Hx of PE in 2014  No longer anticoagulation. CT angiogram shows possible chronic PE on the right lung. V/Q scan to evaluate extent of the PE.   Coronary artery disease with history of STEMI in 09/2013 s/p DES  Continue prasugrel, aspirin, Coreg and statin   Uncontrolled type 2 diabetes mellitus  Poor dietary  adherence and medication compliance. A1c of 8.9.  We'll increase Lantus to 42 units daily. continue home dose premeal aspart  Hypertension  Blood pressure stable. Continue lisinopril and Coreg   Bipolar disorder/PTSD/anxiety  -Continue valium 5 mg TID PRN  -Paliperidone 156 mg injected on  the 16th of every month   Nicotine abuse  -Counseled strongly on smoking cessation.   Morbid obesity - Body mass index is 49.02 kg/(m^2).  -Needs outpatient followup. Patient plans [facility].  -Patient likely has symptoms of OHS/ OSA and would benefit from outpatient sleep study   Code Status: FULL   Family Communication: None at bedside   Disposition Plan: Home with outpt follow up. Patient reports having community partnership and has visiting nurse coming at home. Consultants:  Heart failure team  DVT prophylaxis  Diet: Heart healthy/diabetic    Discharge Exam: Filed Vitals:   03/08/14 1002  BP: 112/54  Pulse:   Temp:   Resp:     General: Middle aged morbidly obese female in no acute distress  HEENT: Moist oral mucosa  Cardiovascular: Normal S1 and S2, no murmurs Respiratory: Diminished breath sounds over bilateral lung bases, no crackles or rhonchi  Abdomen: Soft, nontender, nondistended, bowel sounds present  Musculoskeletal: Warm, 1+ pitting edema bilaterally   Discharge Instructions You were cared for by a hospitalist during your hospital stay. If you have any questions about your discharge medications or the care you received while you were in the hospital after you are discharged, you can call the unit and asked to speak with the hospitalist on call if the hospitalist that took care of you is not available. Once you are discharged, your primary care physician will handle any further medical issues. Please note that NO REFILLS for any discharge medications will be authorized once you are discharged, as it is imperative that you return to your primary care physician (or establish a relationship with a primary care physician if you do not have one) for your aftercare needs so that they can reassess your need for medications and monitor your lab values.   Current Discharge Medication List    CONTINUE these medications  which have CHANGED   Details  insulin detemir (LEVEMIR) 100 UNIT/ML injection Inject 0.42 mLs (42 Units total) into the skin at bedtime. Qty: 10 mL, Refills: 3      CONTINUE these medications which have NOT CHANGED   Details  albuterol (PROVENTIL HFA;VENTOLIN HFA) 108 (90 BASE) MCG/ACT inhaler Inhale 2 puffs into the lungs every 6 (six) hours as needed for wheezing or shortness of breath.     albuterol (PROVENTIL) (2.5 MG/3ML) 0.083% nebulizer solution Take 6 mLs (5 mg total) by nebulization every 4 (four) hours as needed for wheezing or shortness of breath. Qty: 75 mL, Refills: 12    aspirin 81 MG tablet Take 1 tablet (81 mg total) by mouth daily.    benzonatate (TESSALON) 100 MG capsule Take 1 capsule (100 mg total) by mouth 3 (three) times daily as needed for cough. Qty: 20 capsule, Refills: 0    carvedilol (COREG) 25 MG tablet Take 12.5 mg by mouth 2 (two) times daily with a meal.     cycloSPORINE (RESTASIS) 0.05 % ophthalmic emulsion Place 2 drops into both eyes 2 (two) times daily as needed (for dry eyes).     diazepam (VALIUM) 10 MG tablet Take 0.5 tablets (5 mg total) by mouth every 8 (eight) hours as needed for anxiety.  Qty: 30 tablet, Refills: 0    esomeprazole (NEXIUM) 40 MG capsule Take 40 mg by mouth 2 (two) times daily.    fluticasone (FLONASE) 50 MCG/ACT nasal spray Place 2 sprays into both nostrils 2 (two) times daily.     furosemide (LASIX) 40 MG tablet Take 2 tablets (80 mg total) by mouth 2 (two) times daily. Qty: 120 tablet, Refills: 11    Glucose Blood (ACCU-CHEK ADVANTAGE TEST VI) 1 each by Other route See admin instructions. Check blood sugar 4 times daily.    insulin aspart (NOVOLOG) 100 UNIT/ML injection Inject 15-20 Units into the skin 3 (three) times daily before meals. Sliding scale    lisinopril (PRINIVIL,ZESTRIL) 10 MG tablet Take 1 tablet (10 mg total) by mouth daily. Qty: 30 tablet, Refills: 11    nitroGLYCERIN (NITROSTAT) 0.4 MG SL tablet Place  1 tablet (0.4 mg total) under the tongue every 5 (five) minutes as needed for chest pain. Qty: 100 tablet, Refills: 0    omeprazole (PRILOSEC) 40 MG capsule Take 40 mg by mouth 2 (two) times daily.    Paliperidone Palmitate (INVEGA SUSTENNA) 117 MG/0.75ML SUSP Inject 156 mg into the muscle every 30 (thirty) days. One injection monthly. On the 16th of every month    potassium chloride (K-DUR) 10 MEQ tablet Take 10 mEq by mouth 2 (two) times daily.     prasugrel (EFFIENT) 10 MG TABS tablet Take 1 tablet (10 mg total) by mouth daily. Qty: 30 tablet, Refills: 11    pravastatin (PRAVACHOL) 40 MG tablet Take 40 mg by mouth every evening.     predniSONE (STERAPRED UNI-PAK) 10 MG tablet Take 6-5-4-3-2-1 tablets by mouth daily till gone. Qty: 21 tablet, Refills: 0      STOP taking these medications     levofloxacin (LEVAQUIN) 500 MG tablet        Allergies  Allergen Reactions  . Metolazone Other (See Comments)    Dizziness and falling  . Plavix [Clopidogrel Bisulfate] Other (See Comments)    High PRU's - non-responder  . Nsaids Other (See Comments)    "I do not take NSAIDs d/t interfering with other meds"   Follow-up Information   Follow up with [facility] On 03/28/2014. (Appt with Dr. Joya Gaskins at 9:15am)    Specialty:  Pulmonology   Contact information:   [facility]  [address] [phone]      Follow up with [facility], NP On 03/15/2014. (The Heart Failure Clinic is on the floor of [facility]. 8:45  Garage Code [id number])    Specialty:  Nurse Practitioner   Contact information:   [address] [place] [address] [phone]        The results of significant diagnostics from this hospitalization (including imaging, microbiology, ancillary and laboratory) are listed below for reference.    Significant Diagnostic Studies: Dg Chest 2 View  03/04/2014   CLINICAL DATA:  Acute shortness of breath for 2 days. History of  hypertension, asthma, diabetes and congestive heart failure. Initial encounter.  EXAM: CHEST  2 VIEW  COMPARISON:  Radiographs 02/06/2014.  CT 02/01/2014.  FINDINGS: The heart size and mediastinal contours are stable. There is stable chronic lung disease with linear scarring in the left upper lobe. There is new horizontal density within the right minor fissure which likely represents a small amount of loculated pleural fluid. No acute osseous findings are evident.  IMPRESSION: New small amount of fluid within the minor fissure.  Otherwise stable chest with mild chronic lung disease.   Electronically Signed   By: Camie Patience M.D.   On: 03/04/2014 20:09   Ct Angio Chest Pe W/cm &/or Wo Cm  03/04/2014   CLINICAL DATA:  Shortness of breath.  COPD exacerbation.  On oxygen.  EXAM: CT ANGIOGRAPHY CHEST WITH CONTRAST  TECHNIQUE: Multidetector CT imaging of the chest was performed using the standard protocol during bolus administration of intravenous contrast. Multiplanar CT image reconstructions and MIPs were obtained to evaluate the vascular anatomy.  CONTRAST:  126mL OMNIPAQUE IOHEXOL 350 MG/ML SOLN  COMPARISON:  Chest radiographs obtained earlier today. Chest CTA dated 02/01/2014.  FINDINGS: Increased prominence of the pulmonary vasculature and mild increase in prominence of the interstitial markings. Areas of subsegmental atelectasis and scarring bilaterally, with little overall change. Mild left posterior pleural thickening and calcification are unchanged. No lung nodules or enlarged lymph nodes. Interval small amount of loculated fluid in the lateral aspect of the minor fissure on the right. Mild bilateral bullous changes. Previously noted occlusion of the main right lower lobe pulmonary artery. Otherwise, normally opacified pulmonary arteries with no visible filling defects. Mild thoracic spine degenerative changes. Unremarkable upper abdomen.  Review of the MIP images confirms the above findings.  IMPRESSION: 1.  Mild changes of congestive heart failure superimposed on COPD. 2. No acute pulmonary emboli seen. 3. Stable chronic right lower lobe pulmonary artery occlusion, most likely due to previous pulmonary embolic disease. 4. Mildly increased bilateral atelectasis with stable underlying scarring.   Electronically Signed   By: Enrique Sack M.D.   On: 03/04/2014 22:54   Nm Pulmonary Perf And Vent  03/08/2014   CLINICAL DATA:  Dyspnea. History of asthma and COPD. Prior pulmonary embolism 2014.  EXAM: NUCLEAR MEDICINE VENTILATION - PERFUSION LUNG SCAN  TECHNIQUE: Ventilation images were obtained in multiple projections using inhaled aerosol technetium 99 M DTPA. Perfusion images were obtained in multiple projections after intravenous injection of Tc-16m MAA.  RADIOPHARMACEUTICALS:  40.0 mCi Tc-72m DTPA aerosol and 6.0 mCi Tc-11m MAA  COMPARISON:  Chest x-ray 03/04/2014 and chest CT 03/04/2014  FINDINGS: Ventilation: Mild heterogeneity over the central lungs bilaterally with normal perfusion in the area suggesting air trapping.  Perfusion: Single moderate mismatch peripheral defect over the posterior basilar segment right lower lobe.  IMPRESSION: Findings compatible with low probability for pulmonary embolism.   Electronically Signed   By: Marin Olp M.D.   On: 03/08/2014 10:16    Microbiology: Recent Results (from the past 240 hour(s))  CULTURE, BLOOD (ROUTINE X 2)     Status: None   Collection Time    03/04/14  8:35 PM      Result Value Ref Range Status   Specimen Description BLOOD ARM LEFT   Final   Special Requests BOTTLES DRAWN AEROBIC AND ANAEROBIC 10CC   Final   Culture  Setup Time     Final   Value: 03/05/2014 02:02     Performed at [facility]   Culture     Final   Value:        BLOOD CULTURE RECEIVED NO GROWTH TO DATE CULTURE WILL BE HELD FOR 5 DAYS BEFORE ISSUING A FINAL NEGATIVE REPORT     Performed at [facility]   Report Status PENDING   Incomplete  CULTURE, BLOOD (ROUTINE  X 2)     Status: None   Collection Time    03/04/14  8:45 PM      Result Value Ref  Range Status   Specimen Description BLOOD ARM RIGHT   Final   Special Requests BOTTLES DRAWN AEROBIC ONLY 10CC   Final   Culture  Setup Time     Final   Value: 03/05/2014 02:06     Performed at [facility]   Culture     Final   Value:        BLOOD CULTURE RECEIVED NO GROWTH TO DATE CULTURE WILL BE HELD FOR 5 DAYS BEFORE ISSUING A FINAL NEGATIVE REPORT     Performed at [facility]   Report Status PENDING   Incomplete  MRSA PCR SCREENING     Status: None   Collection Time    03/05/14 12:06 AM      Result Value Ref Range Status   MRSA by PCR NEGATIVE  NEGATIVE Final   Comment:            The GeneXpert MRSA Assay (FDA     approved for NASAL specimens     only), is one component of a     comprehensive MRSA colonization     surveillance program. It is not     intended to diagnose MRSA     infection nor to guide or     monitor treatment for     MRSA infections.     Labs: Basic Metabolic Panel:  Recent Labs Lab 03/04/14 1830 03/04/14 2152 03/05/14 0530 03/05/14 1817 03/06/14 0347 03/07/14 0540 03/08/14 0500  NA 138  --  136*  --  137 142 142  K 4.1  --  4.8  --  4.9 4.3 4.4  CL 100  --  97  --  98 99 95*  CO2 30  --  28  --  27 32 37*  GLUCOSE 144*  --  300* 434* 248* 106* 119*  BUN 11  --  16  --  32* 40* 28*  CREATININE 0.83 0.76 0.78  --  0.95 1.29* 1.16*  CALCIUM 8.7  --  8.6  --  8.9 9.1 8.3*   Liver Function Tests:  Recent Labs Lab 03/05/14 0530 03/06/14 0347  AST 11 10  ALT 13 13  ALKPHOS 66 63  BILITOT 0.2* <0.2*  PROT 7.9 7.8  ALBUMIN 2.8* 2.8*   No results found for this basename: LIPASE, AMYLASE,  in the last 168 hours No results found for this basename: AMMONIA,  in the last 168 hours CBC:  Recent Labs Lab 03/04/14 1830 03/04/14 2152 03/05/14 0530 03/06/14 0347  WBC 9.2 9.2 7.4 8.8  NEUTROABS 5.7  --  6.2 7.1  HGB 10.0* 10.0* 9.9* 9.6*   HCT 32.0* 32.0* 32.1* 31.2*  MCV 80.4 78.2 78.5 79.2  PLT 270 268 258 270   Cardiac Enzymes:  Recent Labs Lab 03/04/14 2153 03/05/14 0940  TROPONINI <0.30 <0.30   BNP: BNP (last 3 results)  Recent Labs  02/01/14 1616 02/06/14 1727 03/04/14 1830  PROBNP 371.1* 405.8* 969.3*   CBG:  Recent Labs Lab 03/07/14 1138 03/07/14 1635 03/07/14 2117 03/08/14 0636 03/08/14 1102  GLUCAP 145* 266* 290* 143* 202*       Signed:  Joeleen Wortley  [facility] 03/08/2014, 3:00 PM

## 2014-03-08 NOTE — Progress Notes (Signed)
CARDIAC REHAB PHASE I   PRE:  Rate/Rhythm: 67 SR    BP: sitting 121/63    SaO2: 99 2L  MODE:  Ambulation: 450 ft   POST:  Rate/Rhythm: 95 SR with PVCs    BP: sitting 139/63     SaO2: 95 2L  Pt somewhat SOB but does not admit this. Sts her main problem is her back pain. Had to get RW at 1/2 way to support her back.  Pt sts she watches her sodium but she was not able to st how much sodium she could have a day. She needs a scale. Gave HF book and low sodium diets. Will continue ed (pt got visitor). [place], [place], ACSM 03/08/2014 12:44 PM

## 2014-03-08 NOTE — Progress Notes (Signed)
Heart Failure Navigator Consult Note  Presentation: Tamara Silva is a 53 y.o. year old female with significant past medical history of multiple medical problems including morbid obesity, chronic resp failure on 2L, COPD, chronic mixed CHF, coronary artery disease status post stenting, IDDM, hx/o PE presenting with acute resp failure with hypoxia. Patient reports 2 days of progressive increased worker breathing, cough, wheezing, positive sputum production. Has been using increased amounts of albuterol at home. Positive mild subjective fevers and chills at home. Denies any chest pain. No abdominal pain. Denies any worsening lower extremity edema. Stasis she has been compliant with her CHF regimen. Denies any heavy salt or NSAID intake. Has had some running nose nasal congestion.  Presented to the ER with MAXIMUM TEMPERATURE 100.3, heart rate in the 80s to 100s, respirations and 20s to 30s, blood pressure in the 120s to 170s over 60s to 90s. Satting greater than 94% on 3 L nasal cannula. What also, 0.2, hemoglobin 10, creatinine 0.83. Pro BNP 970 (baseline around 400). Troponin negative x1. Lactate 0.5. ABG indicative of a mildly decompensated chronic respiratory acidosis. Chest x-ray shows new small amount of fluid within the minor fissure. Otherwise stable chest x-ray. CT angiogram is pending. This will be her third CT angiogram this year. EKG was sinus rhythm. Heart rate 99. QTC mildly elevated at 515 milliseconds. Start on IV Solu-Medrol and IV Levaquin in the ER. Status post 40 mg of Lasix IV x1.   Past Medical History  Diagnosis Date  . Diabetes mellitus without complication   . PE (pulmonary embolism)   . Hypercholesterolemia   . Hypertension   . COPD (chronic obstructive pulmonary disease)   . Asthma   . Coronary artery disease   . CHF (congestive heart failure)   . Mental disorder     BIPOLAR  . Shortness of breath   . GERD (gastroesophageal reflux disease)   . Neuromuscular disorder     DIABETIC NEUROPATHY  . Ischemic cardiomyopathy   . Bipolar disorder   . Post-traumatic stress syndrome   . On home oxygen therapy     "2L most all the time" (02/06/2014)    History   Social History  . Marital Status: Single    Spouse Name: N/A    Number of Children: N/A  . Years of Education: N/A   Social History Main Topics  . Smoking status: Former Smoker    Types: Cigarettes    Quit date: 11/24/2012  . Smokeless tobacco: Never Used  . Alcohol Use: No  . Drug Use: No  . Sexual Activity: No   Other Topics Concern  . None   Social History Narrative   Origibnally from [place]   Most recently from [place]   Daughter lives in town      On Social security to CHF, COPD, CAD    ECHO:Study Conclusions  - Left ventricle: The cavity size was severely dilated. Wall thickness was normal. Systolic function was moderately reduced. The estimated ejection fraction was in the range of 35% to 40%. There is moderate hypokinesis of the mid-distalanteroseptal and apical myocardium. Features are consistent with a pseudonormal left ventricular filling pattern, with concomitant abnormal relaxation and increased filling pressure (grade 2 diastolic dysfunction). - Mitral valve: Mild regurgitation. - Left atrium: The atrium was mildly to moderately dilated. - Atrial septum: No defect or patent foramen ovale was identified. Transthoracic echocardiography. M-mode, complete 2D, spectral Doppler, and color Doppler. Height: Height: 170.2cm. Height: 67in. Weight: Weight: 136.1kg. Weight: 299.4lb. Body  mass index: BMI: 47kg/m^2. Body surface area: BSA: 2.75m^2. Blood pressure: 146/79. Patient status: Inpatient. Location: ICU/CCU   BNP    Component Value Date/Time   PROBNP 969.3* 03/04/2014 1830    Education Assessment and Provision:  Detailed education and instructions provided on heart failure disease management including the following:  Signs and symptoms of Heart Failure When  to call the physician Importance of daily weights Low sodium diet Fluid restriction Medication management Anticipated future follow-up appointments  Patient education given on each of the above topics.  Patient acknowledges understanding and acceptance of all instructions.  I spoke with [name] regarding her HF.  She seems to have very little insight to her illness and what it all means.  She lives with her "friend".  I reinforced with her the importance of daily weights and when to call the physician.  She says that she prefers fresh foods and sometimes eats frozen.  We reviewed low sodium diet and foods that are high in sodium to avoid.  She says that her mental health case worker will help her get her medications and help her to get to and from appts.  She says that she can afford medications at this time.  Education Materials:  "Living Better With Heart Failure" Booklet, Daily Weight Tracker Tool   High Risk Criteria for Readmission and/or Poor Patient Outcomes:  (Recommend Follow-up with [facility])--Yes   EF <30%- no-35-40% and Grade 2 dias. dys.  2 or more admissions in 6 months-Yes  Difficult social situation- Yes--? Her ability to comprehend and comply with recommendations  Demonstrates medication noncompliance- No  Barriers of Care:  Knowledge and compliance --her ability to comply  Discharge Planning:   Plans to discharge to home with "friend".  Would benefit from [facility] and paramedicine to reinforce compliance and education.

## 2014-03-08 NOTE — Progress Notes (Signed)
Advanced Heart Failure Rounding Note   Subjective:    Feeling better. VQ only with small defect RLL. Weight down another 3 pounds. Renal function stable.     Objective:   Weight Range:  Vital Signs:   Temp:  [98.6 F (37 C)-99.1 F (37.3 C)] 99.1 F (37.3 C) (10/09 0403) Pulse Rate:  [73-79] 79 (10/09 0403) Resp:  [20] 20 (10/09 0403) BP: (105-134)/(54-58) 112/54 mmHg (10/09 1002) SpO2:  [97 %-100 %] 100 % (10/09 0917) Weight:  [138.9 kg (306 lb 3.5 oz)] 138.9 kg (306 lb 3.5 oz) (10/09 0403) Last BM Date: 03/07/14  Weight change: Filed Weights   03/06/14 1345 03/07/14 0553 03/08/14 0403  Weight: 142.1 kg (313 lb 4.4 oz) 140.3 kg (309 lb 4.9 oz) 138.9 kg (306 lb 3.5 oz)    Intake/Output:   Intake/Output Summary (Last 24 hours) at 03/08/14 1358 Last data filed at 03/08/14 1006  Gross per 24 hour  Intake   1180 ml  Output   3825 ml  Net  -2645 ml     Physical Exam:  General: Morbidly obese. Well appearing. No resp difficulty  HEENT: normal  Neck: supple. Thick unable to see JVD . Carotids 2+ bilat; no bruits. No lymphadenopathy or thryomegaly appreciated.  Cor: PMI nonpalpable Regular rate & rhythm. 2/6 TR  Lungs: clear with decreased BS throughout  Abdomen: obese, soft, nontender, nondistended. No hepatosplenomegaly. No bruits or masses. Good bowel sounds.  Extremities: no cyanosis, clubbing, rash, R and LLE tr edema  Neuro: alert & orientedx3, cranial nerves grossly intact. moves all 4 extremities w/o difficulty. Affect pleasant   Telemetry:  SR  Labs: Basic Metabolic Panel:  Recent Labs Lab 03/04/14 1830 03/04/14 2152 03/05/14 0530 03/05/14 1817 03/06/14 0347 03/07/14 0540 03/08/14 0500  NA 138  --  136*  --  137 142 142  K 4.1  --  4.8  --  4.9 4.3 4.4  CL 100  --  97  --  98 99 95*  CO2 30  --  28  --  27 32 37*  GLUCOSE 144*  --  300* 434* 248* 106* 119*  BUN 11  --  16  --  32* 40* 28*  CREATININE 0.83 0.76 0.78  --  0.95 1.29* 1.16*   CALCIUM 8.7  --  8.6  --  8.9 9.1 8.3*    Liver Function Tests:  Recent Labs Lab 03/05/14 0530 03/06/14 0347  AST 11 10  ALT 13 13  ALKPHOS 66 63  BILITOT 0.2* <0.2*  PROT 7.9 7.8  ALBUMIN 2.8* 2.8*   No results found for this basename: LIPASE, AMYLASE,  in the last 168 hours No results found for this basename: AMMONIA,  in the last 168 hours  CBC:  Recent Labs Lab 03/04/14 1830 03/04/14 2152 03/05/14 0530 03/06/14 0347  WBC 9.2 9.2 7.4 8.8  NEUTROABS 5.7  --  6.2 7.1  HGB 10.0* 10.0* 9.9* 9.6*  HCT 32.0* 32.0* 32.1* 31.2*  MCV 80.4 78.2 78.5 79.2  PLT 270 268 258 270    Cardiac Enzymes:  Recent Labs Lab 03/04/14 2153 03/05/14 0940  TROPONINI <0.30 <0.30    BNP: BNP (last 3 results)  Recent Labs  02/01/14 1616 02/06/14 1727 03/04/14 1830  PROBNP 371.1* 405.8* 969.3*     Other results:  EKG:   Imaging: Nm Pulmonary Perf And Vent  03/08/2014   CLINICAL DATA:  Dyspnea. History of asthma and COPD. Prior pulmonary embolism 2014.  EXAM: NUCLEAR  MEDICINE VENTILATION - PERFUSION LUNG SCAN  TECHNIQUE: Ventilation images were obtained in multiple projections using inhaled aerosol technetium 99 M DTPA. Perfusion images were obtained in multiple projections after intravenous injection of Tc-61m MAA.  RADIOPHARMACEUTICALS:  40.0 mCi Tc-47m DTPA aerosol and 6.0 mCi Tc-83m MAA  COMPARISON:  Chest x-ray 03/04/2014 and chest CT 03/04/2014  FINDINGS: Ventilation: Mild heterogeneity over the central lungs bilaterally with normal perfusion in the area suggesting air trapping.  Perfusion: Single moderate mismatch peripheral defect over the posterior basilar segment right lower lobe.  IMPRESSION: Findings compatible with low probability for pulmonary embolism.   Electronically Signed   By: Marin Olp M.D.   On: 03/08/2014 10:16      Medications:     Scheduled Medications: . antiseptic oral rinse  7 mL Mouth Rinse BID  . aspirin EC  81 mg Oral Daily  .  carvedilol  12.5 mg Oral BID WC  . fluticasone  2 spray Each Nare BID  . furosemide  80 mg Oral BID  . heparin  5,000 Units Subcutaneous 3 times per day  . insulin aspart  0-20 Units Subcutaneous TID WC  . insulin detemir  42 Units Subcutaneous QHS  . ipratropium-albuterol  3 mL Nebulization QID  . levofloxacin  750 mg Intravenous Q24H  . lisinopril  10 mg Oral Daily  . pantoprazole  80 mg Oral Q1200  . prasugrel  10 mg Oral Daily  . pravastatin  60 mg Oral QPM     Infusions:     PRN Medications:  albuterol, benzonatate, cycloSPORINE, diazepam, nitroGLYCERIN, technetium TC 9M diethylenetriame-pentaacetic acid   Assessment:   1. A/C Respiratory Failure  2. CAD- STEMI 09/2013--> DES to RCA  Prior stent to LAD 2006  3. Acute/Chronic Systolic Heart Failure ICM ECHO 09/2013 EF 35-40%  4. COPD  5. Obesity, morbid  6. Uncontrolled DM  7. H/O PE 2014 previously on Xarelto for ~ 7 months  --chronic RLL filling defect on CT  8. HTN  9. Snores ? Sleep Apnea.    Plan/Discussion:    She looks much better.  Can go home today. We will increase lasix to 80 bid. Was on 80/40. Long discussion about need to limit fluid intake.   Our HF Navigator will set her up with home scale and also arrange Paramedicine f/u.  Small chronic PE. No need for long-term anti-coagulation at this point.   Will need outpatient sleep study.   Can stop abx.  F/u HF Clinic Friday 10/16 at [place].    Length of Stay: 4    Glori Bickers MD 03/08/2014, 1:58 PM  Advanced Heart Failure Team Pager [phone] (M-F; [place])  Please contact [facility] for night-coverage after hours (4p -7a ) and weekends on [email or web address]

## 2014-03-08 NOTE — Progress Notes (Signed)
All d/c instructions explained and given to pt.  Verbalized understanding. .  Petros Ahart, RN. 

## 2014-03-08 NOTE — Progress Notes (Signed)
At 1559 pt d/c off floor via w/c to awaiting transport to home.  Karie Kirks, Therapist, sports.

## 2014-03-08 NOTE — Progress Notes (Signed)
Nutrition Brief Note  RD re-consulted for Diet Education for CHF. Seen by RD and educated on 10/8. Followed-up with pt to see if there were any further questions.   - Pt eating 100%. - Re-consult as needed  Terrace Arabia RD, LDN

## 2014-03-11 ENCOUNTER — Encounter (HOSPITAL_COMMUNITY): Payer: Medicaid Other

## 2014-03-11 LAB — CULTURE, BLOOD (ROUTINE X 2)
Culture: NO GROWTH
Culture: NO GROWTH

## 2014-03-15 ENCOUNTER — Encounter (HOSPITAL_COMMUNITY): Payer: Self-pay

## 2014-03-15 ENCOUNTER — Ambulatory Visit (HOSPITAL_COMMUNITY)
Admit: 2014-03-15 | Discharge: 2014-03-15 | Disposition: A | Discharge status: Home / Self Care | Payer: Medicaid Other | Source: Ambulatory Visit | Attending: Internal Medicine | Admitting: Internal Medicine

## 2014-03-15 VITALS — BP 116/82 | HR 64 | Resp 18 | Wt 305.5 lb

## 2014-03-15 DIAGNOSIS — I5022 Chronic systolic (congestive) heart failure: Secondary | ICD-10-CM | POA: Diagnosis present

## 2014-03-15 DIAGNOSIS — F310 Bipolar disorder, current episode hypomanic: Secondary | ICD-10-CM

## 2014-03-15 DIAGNOSIS — E785 Hyperlipidemia, unspecified: Secondary | ICD-10-CM | POA: Diagnosis not present

## 2014-03-15 DIAGNOSIS — J410 Simple chronic bronchitis: Secondary | ICD-10-CM

## 2014-03-15 DIAGNOSIS — I110 Hypertensive heart disease with heart failure: Secondary | ICD-10-CM | POA: Insufficient documentation

## 2014-03-15 DIAGNOSIS — J449 Chronic obstructive pulmonary disease, unspecified: Secondary | ICD-10-CM | POA: Diagnosis not present

## 2014-03-15 DIAGNOSIS — Z87891 Personal history of nicotine dependence: Secondary | ICD-10-CM | POA: Diagnosis not present

## 2014-03-15 DIAGNOSIS — F319 Bipolar disorder, unspecified: Secondary | ICD-10-CM | POA: Diagnosis not present

## 2014-03-15 DIAGNOSIS — I2510 Atherosclerotic heart disease of native coronary artery without angina pectoris: Secondary | ICD-10-CM

## 2014-03-15 DIAGNOSIS — Z720 Tobacco use: Secondary | ICD-10-CM

## 2014-03-15 DIAGNOSIS — I255 Ischemic cardiomyopathy: Secondary | ICD-10-CM

## 2014-03-15 DIAGNOSIS — E119 Type 2 diabetes mellitus without complications: Secondary | ICD-10-CM | POA: Diagnosis not present

## 2014-03-15 DIAGNOSIS — Z9861 Coronary angioplasty status: Secondary | ICD-10-CM

## 2014-03-15 DIAGNOSIS — I5042 Chronic combined systolic (congestive) and diastolic (congestive) heart failure: Secondary | ICD-10-CM

## 2014-03-15 LAB — BASIC METABOLIC PANEL
Anion gap: 12 (ref 5–15)
BUN: 19 mg/dL (ref 6–23)
CO2: 28 mEq/L (ref 19–32)
CREATININE: 0.92 mg/dL (ref 0.50–1.10)
Calcium: 8.8 mg/dL (ref 8.4–10.5)
Chloride: 100 mEq/L (ref 96–112)
GFR, EST AFRICAN AMERICAN: 80 mL/min — AB (ref >90)
GFR, EST NON AFRICAN AMERICAN: 69 mL/min — AB (ref >90)
Glucose, Bld: 109 mg/dL — ABNORMAL HIGH (ref 70–99)
Potassium: 4.1 mEq/L (ref 3.7–5.3)
Sodium: 140 mEq/L (ref 137–147)

## 2014-03-15 LAB — PRO B NATRIURETIC PEPTIDE: Pro B Natriuretic peptide (BNP): 164.9 pg/mL — ABNORMAL HIGH (ref 0–125)

## 2014-03-15 MED ORDER — SPIRONOLACTONE 25 MG PO TABS: 12.5000 mg | ORAL_TABLET | Freq: Every day | ORAL | Status: DC

## 2014-03-15 MED ORDER — PREDNISONE 10 MG PO TABS: ORAL_TABLET | ORAL | Status: DC

## 2014-03-15 NOTE — Progress Notes (Addendum)
Patient ID: Messiah Buccieri, female   DOB: 12-22-1960, 53 y.o.   MRN: [id number]  Primary Physician: Dr. Oletta Lamas ([facility]) working on transitioning to [facility] and wellness Primary Cardiologist: Dr Harding/Dr. Einar Gip  HPI: Ms Ansah is a 53 year old with morbid obesity, COPD (stopped smoking 1/15), DM2, CAD with history of PCI at [facility] in 2006 [facility] 2009 (likely to the LAD), hypertension, hyperlipidemia, H/O chronic LLL PE 2014 and chronic systolic HF.  Admitted Oct 06, 2013 with STEMI of inferior wall and had Xience Alpine DES 2.75 mm x 15 mm to the RCA. Initially on plavix but P2Y12 test showed poor response and she was switched to effient. Hospital course was complicated by bradycardia. Carvedilol was cut back due to bradycardia.   [facility] Follow up for Heart Failure: Feeling ok.Denies SOB, PND, orthopnea or CP. Weight at home 303-306 lbs. Taking all medications. Following a low salt diet and drinking less than 2L a day. Can walk about 100-200 ft before stopping d/t SOB. Coughing up clear white sputum. No fevers or chills.    Studies  Cardiolite 08/29/2013: Extensive LAD distribution scar with severe systolic dysfunction. No reversible ischemia. EF roughly 28%  LHC 10/07/13 With severe single-vessel disease with distal RCA 99% thrombotic subtotal occlusion, successful PCI of the distal RCA with a Xience Alpine 2.75 mm x 45mm postdilated to 3.1 mm Mild in-stent restenosis in the LAD stent. Dilated Left Ventricle with global hypokinesis and EF of roughly 30%.  ECHO 10/07/13: EF 35-40%, grade II DD, mild MR   ROS: All systems negative except as listed in HPI, PMH and Problem List.  SH:  History   Social History  . Marital Status: Single    Spouse Name: N/A    Number of Children: N/A  . Years of Education: N/A   Occupational History  . Not on file.   Social History Main Topics  . Smoking status: Former Smoker    Types: Cigarettes    Quit date: 11/24/2012   . Smokeless tobacco: Never Used  . Alcohol Use: No  . Drug Use: No  . Sexual Activity: No   Other Topics Concern  . Not on file   Social History Narrative   Origibnally from [place]   Most recently from [place]   Daughter lives in town      On Social security to CHF, COPD, CAD    FH:  Family History  Problem Relation Age of Onset  . Stroke Mother   . Cancer Father     Past Medical History  Diagnosis Date  . Diabetes mellitus without complication   . PE (pulmonary embolism)   . Hypercholesterolemia   . Hypertension   . COPD (chronic obstructive pulmonary disease)   . Asthma   . Coronary artery disease   . CHF (congestive heart failure)   . Mental disorder     BIPOLAR  . Shortness of breath   . GERD (gastroesophageal reflux disease)   . Neuromuscular disorder     DIABETIC NEUROPATHY  . Ischemic cardiomyopathy   . Bipolar disorder   . Post-traumatic stress syndrome   . On home oxygen therapy     "2L most all the time" (02/06/2014)    Current Outpatient Prescriptions  Medication Sig Dispense Refill  . albuterol (PROVENTIL HFA;VENTOLIN HFA) 108 (90 BASE) MCG/ACT inhaler Inhale 2 puffs into the lungs every 6 (six) hours as needed for wheezing or shortness of breath.       [name] Kitchen  albuterol (PROVENTIL) (2.5 MG/3ML) 0.083% nebulizer solution Take 6 mLs (5 mg total) by nebulization every 4 (four) hours as needed for wheezing or shortness of breath.  75 mL  12  . aspirin 81 MG tablet Take 1 tablet (81 mg total) by mouth daily.      . benzonatate (TESSALON) 100 MG capsule Take 1 capsule (100 mg total) by mouth 3 (three) times daily as needed for cough.  20 capsule  0  . carvedilol (COREG) 25 MG tablet Take 12.5 mg by mouth 2 (two) times daily with a meal.       . cycloSPORINE (RESTASIS) 0.05 % ophthalmic emulsion Place 2 drops into both eyes 2 (two) times daily as needed (for dry eyes).       . diazepam (VALIUM) 10 MG tablet Take 0.5 tablets (5 mg total) by mouth every 8  (eight) hours as needed for anxiety.  30 tablet  0  . esomeprazole (NEXIUM) 40 MG capsule Take 40 mg by mouth 2 (two) times daily.      . fluticasone (FLONASE) 50 MCG/ACT nasal spray Place 2 sprays into both nostrils 2 (two) times daily.       . furosemide (LASIX) 40 MG tablet Take 2 tablets (80 mg total) by mouth 2 (two) times daily.  120 tablet  11  . Glucose Blood (ACCU-CHEK ADVANTAGE TEST VI) 1 each by Other route See admin instructions. Check blood sugar 4 times daily.      . insulin aspart (NOVOLOG) 100 UNIT/ML injection Inject 15-20 Units into the skin 3 (three) times daily before meals. Sliding scale      . insulin detemir (LEVEMIR) 100 UNIT/ML injection Inject 0.42 mLs (42 Units total) into the skin at bedtime.  10 mL  3  . lisinopril (PRINIVIL,ZESTRIL) 10 MG tablet Take 1 tablet (10 mg total) by mouth daily.  30 tablet  11  . nitroGLYCERIN (NITROSTAT) 0.4 MG SL tablet Place 1 tablet (0.4 mg total) under the tongue every 5 (five) minutes as needed for chest pain.  100 tablet  0  . omeprazole (PRILOSEC) 40 MG capsule Take 40 mg by mouth 2 (two) times daily.      . Paliperidone Palmitate (INVEGA SUSTENNA) 117 MG/0.75ML SUSP Inject 156 mg into the muscle every 30 (thirty) days. One injection monthly. On the 16th of every month      . potassium chloride (K-DUR) 10 MEQ tablet Take 10 mEq by mouth 2 (two) times daily.       . prasugrel (EFFIENT) 10 MG TABS tablet Take 1 tablet (10 mg total) by mouth daily.  30 tablet  11  . pravastatin (PRAVACHOL) 40 MG tablet Take 40 mg by mouth every evening.        No current facility-administered medications for this encounter.    Filed Vitals:   03/15/14 0841  BP: 116/82  Pulse: 64  Resp: 18  Weight: 305 lb 8 oz (138.574 kg)  SpO2: 92%    PHYSICAL EXAM: General:  Well appearing. No resp difficulty, sitting in wheelchair, caregiver present HEENT: normal Neck: supple. JVP difficult to assess d/t body habitus but does not appear elevated; Carotids  2+ bilaterally; no bruits. No lymphadenopathy or thryomegaly appreciated. Cor: PMI normal. Regular rate & rhythm. No rubs, gallops or murmurs. Lungs: wheezing throughout Abdomen: obese, soft, nontender, nondistended. No hepatosplenomegaly. No bruits or masses. Good bowel sounds. Extremities: no cyanosis, clubbing, rash, trace bilateral edema Neuro: alert & orientedx3, cranial nerves grossly intact. Moves all 4  extremities w/o difficulty. Affect pleasant.   ASSESSMENT & PLAN:  1) Chronic systolic heart failure: ICM, EF 35-40% (09/2013) - Reviewed discharge summary and patient was recently discharged from the hospital for A/C HF.  - NYHA II-III symptoms and volume status stable. Will continue lasix 80 BID. Discussed the need to call if weight trending up. Check BMET and pro-BNP today.  - Continue coreg 12.5 mg BID along with lisinopril 10 mg daily.  - Will start spironolactone 12.5 mg daily and stop potassium. Check BMET 7-10 days.  - Concerned that patient does not have a great understanding of her condition and am not sure if taking medications correctly. Will ask paramedicine program to get involved which patient is open to.  - Discussed the role of the transitions clinic and the need for daily weights, taking medications as prescribed, following a low salt diet and drinking less than 2L a day.  2) COPD - Did not take nebulizer treatment this am and encouraged her to go home and take it. She was supposed to be sent home on prednisone taper. Will order 40 mg-30 mg-20 mg-10 mg taper. She is to follow up with Dr. Joya Gaskins later this month and instructed to not miss visit.  3) ?OSA - Reports snoring heavily and boyfriend agrees. Has pulmonary referral 03/28/14. Likely will need a sleep study.  4) HTN - Stable continue current medicaitons 5) CAD s/p DES (09/2013) - no s/s of ischemia. Managed by Dr. Debara Pickett. Continue Effient, ASA and statin 6) DM2 - Follow up with PCP. She is working on trying to  change PCPs to [place] so all the information is in one system. 7) Bipolar disorder - Reports taking her paliperidone injection yesterday and encouraged to continue and follow up with PCP. 8) Nicotine abuse- - She reports no longer smoking and congratulated her on this success. She is using nicorette gum.    F/U 2 weeks Rande Brunt NP-C  Start sprio 12.5 mg daily. Follow up 2 weeks Stop potassium Prednsione taper 40 mg 30 mg 20 mg 10 mg

## 2014-03-15 NOTE — Patient Instructions (Signed)
Doing great, keep up the good work.  Start spironolactone 12.5 mg (1/2 tablet) daily.  Stop your potassium.  Start prednisone taper: 40 mg (4 tablets), then 30 mg (3 tablets) then 20 mg (2 tablets), then 10 mg (1 tablet)  Follow up in 2 weeks.  Will get Susie with EMS involved.   Bring all your medications to your next visit.  Do the following things EVERYDAY: 1) Weigh yourself in the morning before breakfast. Write it down and keep it in a log. 2) Take your medicines as prescribed 3) Eat low salt foods-Limit salt (sodium) to 2000 mg per day.  4) Stay as active as you can everyday 5) Limit all fluids for the day to less than 2 liters 6)

## 2014-03-22 ENCOUNTER — Encounter: Payer: Self-pay | Admitting: Internal Medicine

## 2014-03-22 ENCOUNTER — Ambulatory Visit: Payer: Medicaid Other | Attending: Internal Medicine | Admitting: Internal Medicine

## 2014-03-22 VITALS — BP 114/71 | HR 67 | Temp 98.5°F | Resp 20 | Ht 67.0 in | Wt 308.0 lb

## 2014-03-22 DIAGNOSIS — I2510 Atherosclerotic heart disease of native coronary artery without angina pectoris: Secondary | ICD-10-CM | POA: Insufficient documentation

## 2014-03-22 DIAGNOSIS — IMO0001 Reserved for inherently not codable concepts without codable children: Secondary | ICD-10-CM

## 2014-03-22 DIAGNOSIS — J449 Chronic obstructive pulmonary disease, unspecified: Secondary | ICD-10-CM

## 2014-03-22 DIAGNOSIS — J42 Unspecified chronic bronchitis: Secondary | ICD-10-CM | POA: Insufficient documentation

## 2014-03-22 DIAGNOSIS — Z955 Presence of coronary angioplasty implant and graft: Secondary | ICD-10-CM | POA: Diagnosis not present

## 2014-03-22 DIAGNOSIS — Z7951 Long term (current) use of inhaled steroids: Secondary | ICD-10-CM | POA: Diagnosis not present

## 2014-03-22 DIAGNOSIS — I10 Essential (primary) hypertension: Secondary | ICD-10-CM | POA: Diagnosis not present

## 2014-03-22 DIAGNOSIS — I252 Old myocardial infarction: Secondary | ICD-10-CM | POA: Insufficient documentation

## 2014-03-22 DIAGNOSIS — Z79899 Other long term (current) drug therapy: Secondary | ICD-10-CM | POA: Insufficient documentation

## 2014-03-22 DIAGNOSIS — Z7982 Long term (current) use of aspirin: Secondary | ICD-10-CM | POA: Insufficient documentation

## 2014-03-22 DIAGNOSIS — E1140 Type 2 diabetes mellitus with diabetic neuropathy, unspecified: Secondary | ICD-10-CM | POA: Insufficient documentation

## 2014-03-22 DIAGNOSIS — E119 Type 2 diabetes mellitus without complications: Secondary | ICD-10-CM

## 2014-03-22 DIAGNOSIS — Z794 Long term (current) use of insulin: Secondary | ICD-10-CM | POA: Diagnosis not present

## 2014-03-22 DIAGNOSIS — J9691 Respiratory failure, unspecified with hypoxia: Secondary | ICD-10-CM | POA: Diagnosis present

## 2014-03-22 DIAGNOSIS — K219 Gastro-esophageal reflux disease without esophagitis: Secondary | ICD-10-CM | POA: Insufficient documentation

## 2014-03-22 DIAGNOSIS — I255 Ischemic cardiomyopathy: Secondary | ICD-10-CM | POA: Diagnosis not present

## 2014-03-22 DIAGNOSIS — Z86711 Personal history of pulmonary embolism: Secondary | ICD-10-CM | POA: Insufficient documentation

## 2014-03-22 DIAGNOSIS — Z87891 Personal history of nicotine dependence: Secondary | ICD-10-CM | POA: Insufficient documentation

## 2014-03-22 DIAGNOSIS — J9611 Chronic respiratory failure with hypoxia: Secondary | ICD-10-CM

## 2014-03-22 DIAGNOSIS — Z9981 Dependence on supplemental oxygen: Secondary | ICD-10-CM | POA: Diagnosis not present

## 2014-03-22 DIAGNOSIS — Z7902 Long term (current) use of antithrombotics/antiplatelets: Secondary | ICD-10-CM | POA: Diagnosis not present

## 2014-03-22 DIAGNOSIS — I5022 Chronic systolic (congestive) heart failure: Secondary | ICD-10-CM | POA: Diagnosis not present

## 2014-03-22 LAB — GLUCOSE, POCT (MANUAL RESULT ENTRY): POC Glucose: 210 mg/dl — AB (ref 70–99)

## 2014-03-22 MED ORDER — GUAIFENESIN-CODEINE 100-10 MG/5ML PO SOLN: 10.0000 mL | Freq: Four times a day (QID) | ORAL | Status: DC | PRN

## 2014-03-22 MED ORDER — LEVOFLOXACIN 750 MG PO TABS: 750.0000 mg | ORAL_TABLET | Freq: Every day | ORAL | Status: DC

## 2014-03-22 MED ORDER — FUROSEMIDE 40 MG PO TABS: 80.0000 mg | ORAL_TABLET | Freq: Two times a day (BID) | ORAL | Status: DC

## 2014-03-22 MED ORDER — ALBUTEROL SULFATE (2.5 MG/3ML) 0.083% IN NEBU: 5.0000 mg | INHALATION_SOLUTION | RESPIRATORY_TRACT | Status: DC | PRN

## 2014-03-22 NOTE — Progress Notes (Signed)
Patient ID: Tamara Silva, female   DOB: 1961-02-23, 53 y.o.   MRN: [id number]   Tamara Silva, is a 53 y.o. female  [id number]  [id number]  DOB - [birth year]  CC:  Chief Complaint  Patient presents with  . Hospitalization Follow-up  . Hypertension  . Diabetes  . Congestive Heart Failure  . Coronary Artery Disease       HPI: Tamara Silva is a 53 y.o. female here today to establish medical care for Chronic Resp failure admitted to [facility] hospital 03/15/14. Patient has history of COPD with chronic respiratory failure on 2 L oxygen at home, chronic systolic and diastolic CHF, NYHA class II with EF of 30-35%, CAD with STEMI in 09/2013 status post RCA stent, hypertension, type 2 diabetes mellitus, normocytic anemia, bipolar disorder who was admitted to step down with acute hypoxic respiratory failure. Patient is taking prescribed medication daily. Pt is on 2 Liters home O2 with HFA inhaler/ Duo-nebs as needed. Sats with around 92%.Denies chest pain, slight lower extrem swelling in left ankle area. Weight 308lb, taking Lasix 40 mg tab daily. Pt has scheduled appt with Pulmonology 10/29 with Dr. Joya Gaskins @ [facility]. Pt took last dose of Prednisone 10 mg tab this am, compliant with Novolog injections 15 units TID. Last A1c-8.9 03/04/14. Patient has No headache, No chest pain, No abdominal pain - No Nausea, No new weakness tingling or numbness, No Cough - SOB.  Allergies  Allergen Reactions  . Metolazone Other (See Comments)    Dizziness and falling  . Plavix [Clopidogrel Bisulfate] Other (See Comments)    High PRU's - non-responder  . Nsaids Other (See Comments)    "I do not take NSAIDs d/t interfering with other meds"   Past Medical History  Diagnosis Date  . Diabetes mellitus without complication   . PE (pulmonary embolism)   . Hypercholesterolemia   . Hypertension   . COPD (chronic obstructive pulmonary disease)   . Asthma   . Coronary artery disease   . CHF (congestive  heart failure)   . Mental disorder     BIPOLAR  . Shortness of breath   . GERD (gastroesophageal reflux disease)   . Neuromuscular disorder     DIABETIC NEUROPATHY  . Ischemic cardiomyopathy   . Bipolar disorder   . Post-traumatic stress syndrome   . On home oxygen therapy     "2L most all the time" (02/06/2014)   Current Outpatient Prescriptions on File Prior to Visit  Medication Sig Dispense Refill  . albuterol (PROVENTIL HFA;VENTOLIN HFA) 108 (90 BASE) MCG/ACT inhaler Inhale 2 puffs into the lungs every 6 (six) hours as needed for wheezing or shortness of breath.       [name] Kitchen aspirin 81 MG tablet Take 1 tablet (81 mg total) by mouth daily.      . carvedilol (COREG) 25 MG tablet Take 12.5 mg by mouth 2 (two) times daily with a meal.       . diazepam (VALIUM) 10 MG tablet Take 0.5 tablets (5 mg total) by mouth every 8 (eight) hours as needed for anxiety.  30 tablet  0  . insulin aspart (NOVOLOG) 100 UNIT/ML injection Inject 15-20 Units into the skin 3 (three) times daily before meals. Sliding scale      . lisinopril (PRINIVIL,ZESTRIL) 10 MG tablet Take 1 tablet (10 mg total) by mouth daily.  30 tablet  11  . omeprazole (PRILOSEC) 40 MG capsule Take 40 mg by mouth 2 (two) times daily.      [name] Kitchen  prasugrel (EFFIENT) 10 MG TABS tablet Take 1 tablet (10 mg total) by mouth daily.  30 tablet  11  . pravastatin (PRAVACHOL) 40 MG tablet Take 40 mg by mouth every evening.       . predniSONE (DELTASONE) 10 MG tablet Take 40 mg (4 tablets), then 30 mg (3 tabs), then 20 mg (2 tabs) and then 10 mg (1 tab)  10 tablet  0  . spironolactone (ALDACTONE) 25 MG tablet Take 0.5 tablets (12.5 mg total) by mouth daily.  15 tablet  3  . benzonatate (TESSALON) 100 MG capsule Take 1 capsule (100 mg total) by mouth 3 (three) times daily as needed for cough.  20 capsule  0  . cycloSPORINE (RESTASIS) 0.05 % ophthalmic emulsion Place 2 drops into both eyes 2 (two) times daily as needed (for dry eyes).       [name] Kitchen esomeprazole  (NEXIUM) 40 MG capsule Take 40 mg by mouth 2 (two) times daily.      . fluticasone (FLONASE) 50 MCG/ACT nasal spray Place 2 sprays into both nostrils 2 (two) times daily.       . Glucose Blood (ACCU-CHEK ADVANTAGE TEST VI) 1 each by Other route See admin instructions. Check blood sugar 4 times daily.      . nitroGLYCERIN (NITROSTAT) 0.4 MG SL tablet Place 1 tablet (0.4 mg total) under the tongue every 5 (five) minutes as needed for chest pain.  100 tablet  0  . Paliperidone Palmitate (INVEGA SUSTENNA) 117 MG/0.75ML SUSP Inject 156 mg into the muscle every 30 (thirty) days. One injection monthly. On the 16th of every month       No current facility-administered medications on file prior to visit.   Family History  Problem Relation Age of Onset  . Stroke Mother   . Cancer Father    History   Social History  . Marital Status: Single    Spouse Name: N/A    Number of Children: N/A  . Years of Education: N/A   Occupational History  . Not on file.   Social History Main Topics  . Smoking status: Former Smoker    Types: Cigarettes    Quit date: 11/24/2012  . Smokeless tobacco: Never Used  . Alcohol Use: No  . Drug Use: No  . Sexual Activity: No   Other Topics Concern  . Not on file   Social History Narrative   Origibnally from [place]   Most recently from [place]   Daughter lives in town      On Social security to CHF, COPD, CAD    Review of Systems: Constitutional: Negative for fever, chills, diaphoresis, activity change, appetite change and fatigue. HENT: Negative for ear pain, nosebleeds, congestion, facial swelling, rhinorrhea, neck pain, neck stiffness and ear discharge.  Eyes: Negative for pain, discharge, redness, itching and visual disturbance..  Cardiovascular: Negative for chest pain, palpitations and leg swelling. Gastrointestinal: Negative for abdominal distention. Genitourinary: Negative for dysuria, urgency, frequency, hematuria, flank pain, decreased urine  volume, difficulty urinating and dyspareunia.  Musculoskeletal: Negative for back pain, joint swelling, arthralgia and gait problem. Neurological: Negative for dizziness, tremors, seizures, syncope, facial asymmetry, speech difficulty, weakness, light-headedness, numbness and headaches.  Hematological: Negative for adenopathy. Does not bruise/bleed easily. Psychiatric/Behavioral: Negative for hallucinations, behavioral problems, confusion, dysphoric mood, has anxiety and depression, denies SI/HI.   Objective:   Filed Vitals:   03/22/14 0956  BP: 114/71  Pulse: 67  Temp: 98.5 F (36.9 C)  Resp: 20  Physical Exam: Constitutional: Patient appears well-developed and well-nourished. No distress. HENT: Normocephalic, atraumatic, External right and left ear normal. Oropharynx is clear and moist.  Eyes: Conjunctivae and EOM are normal. PERRLA, no scleral icterus. Neck: Normal ROM. Neck supple. No JVD. No tracheal deviation. No thyromegaly. CVS: RRR, S1/S2 +, no murmurs, no gallops, no carotid bruit.  Pulmonary: On 02 2L by [place], reduced air entry bilaterally, transmitted sounds. Abdominal: Soft. BS +, no distension, tenderness, rebound or guarding.  Musculoskeletal: Normal range of motion. No edema and no tenderness.  Lymphadenopathy: No lymphadenopathy noted, cervical, inguinal or axillary Neuro: Alert. Normal reflexes, muscle tone coordination. No cranial nerve deficit. Skin: Skin is warm and dry. No rash noted. Not diaphoretic. No erythema. No pallor. Psychiatric: Normal mood and affect. Behavior, judgment, thought content normal.  Lab Results  Component Value Date   WBC 8.8 03/06/2014   HGB 9.6* 03/06/2014   HCT 31.2* 03/06/2014   MCV 79.2 03/06/2014   PLT 270 03/06/2014   Lab Results  Component Value Date   CREATININE 0.92 03/15/2014   BUN 19 03/15/2014   NA 140 03/15/2014   K 4.1 03/15/2014   CL 100 03/15/2014   CO2 28 03/15/2014    Lab Results  Component Value Date    HGBA1C 8.9* 03/04/2014   Lipid Panel     Component Value Date/Time   CHOL 184 02/07/2014 0926   TRIG 66 02/07/2014 0926   HDL 86 02/07/2014 0926   CHOLHDL 2.1 02/07/2014 0926   VLDL 13 02/07/2014 0926   LDLCALC 85 02/07/2014 0926       Assessment and plan:   1. Type 2 diabetes mellitus without complication  - Glucose (CBG) -Recent HbA1C is 8.9%. Continue current medications  Aim for 2-3 Carb Choices per meal (30-45 grams) +/- 1 either way  Aim for 0-15 Carbs per snack if hungry  Include protein in moderation with your meals and snacks  Consider reading food labels for Total Carbohydrate and Fat Grams of foods  Consider checking BG at alternate times per day  Continue taking medication as directed Fruit Punch - find one with no sugar  Measure and decrease portions of carbohydrate foods  Make your plate and don't go back for seconds   2. Chronic systolic congestive heart failure  - furosemide (LASIX) 40 MG tablet; Take 2 tablets (80 mg total) by mouth 2 (two) times daily.  Dispense: 360 tablet; Refill: 3  3. Essential hypertension  - We have discussed target BP range and blood pressure goal - I have advised patient to check BP regularly and to call us back or report to clinic if the numbers are consistently higher than 140/90  - We discussed the importance of compliance with medical therapy and DASH diet recommended, consequences of uncontrolled hypertension discussed.  - continue current BP medications  4. COPD bronchitis  - levofloxacin (LEVAQUIN) 750 MG tablet; Take 1 tablet (750 mg total) by mouth daily.  Dispense: 7 tablet; Refill: 0 - guaiFENesin-codeine 100-10 MG/5ML syrup; Take 10 mLs by mouth every 6 (six) hours as needed for cough.  Dispense: 480 mL; Refill: 0  5. Chronic respiratory failure with hypoxia  - albuterol (PROVENTIL) (2.5 MG/3ML) 0.083% nebulizer solution; Take 6 mLs (5 mg total) by nebulization every 4 (four) hours as needed for wheezing or shortness of  breath.  Dispense: 75 mL; Refill: 1 - Continue home oxygen - Follow up with pulmonologist as scheduled   Return in about 4 weeks (around 04/19/2014), or  if symptoms worsen or fail to improve, for COPD, Heart Failure and Hypertension, Pulmonary Embolism and Anticoagulation, Hemoglobin A1C and Fol.  The patient was given clear instructions to go to ER or return to medical center if symptoms don't improve, worsen or new problems develop. The patient verbalized understanding. The patient was told to call to get lab results if they haven't heard anything in the next week.     This note has been created with Surveyor, quantity. Any transcriptional errors are unintentional.    Angelica Chessman, MD, [employer], [place], [place], [place]   03/22/2014, 10:38 AM

## 2014-03-22 NOTE — Progress Notes (Signed)
Pt here to establish care for Chronic Resp Fx admitted to [facility] hospital 03/15/14 Pt has multiple medical problems  Taking prescribed medication daily  Pt is on 2 Liters home O2 with HFA inhaler/ Duo-nebs as needed Sats with resting 92% r/a applied 3 liters [place]  Denies chest pain,slight lower extrem swelling seen in left ankle area Weight 308lb, taking Lasix 40 mg tab Pt has scheduled appt with Pulmonology 10/29 with Dr. Joya Gaskins @ [facility]210, pt took last dose of Prednisone 10 mg tab this am,compliant with Novolog injections 15 units TID Last A1c-8.9 03/04/14

## 2014-03-22 NOTE — Patient Instructions (Signed)
Chronic Obstructive Pulmonary Disease Chronic obstructive pulmonary disease (COPD) is a common lung condition in which airflow from the lungs is limited. COPD is a general term that can be used to describe many different lung problems that limit airflow, including both chronic bronchitis and emphysema. If you have COPD, your lung function will probably never return to normal, but there are measures you can take to improve lung function and make yourself feel better.  CAUSES   Smoking (common).   Exposure to secondhand smoke.   Genetic problems.  Chronic inflammatory lung diseases or recurrent infections. SYMPTOMS   Shortness of breath, especially with physical activity.   Deep, persistent (chronic) cough with a large amount of thick mucus.   Wheezing.   Rapid breaths (tachypnea).   Gray or bluish discoloration (cyanosis) of the skin, especially in fingers, toes, or lips.   Fatigue.   Weight loss.   Frequent infections or episodes when breathing symptoms become much worse (exacerbations).   Chest tightness. DIAGNOSIS  Your health care provider will take a medical history and perform a physical examination to make the initial diagnosis. Additional tests for COPD may include:   Lung (pulmonary) function tests.  Chest X-ray.  CT scan.  Blood tests. TREATMENT  Treatment available to help you feel better when you have COPD includes:   Inhaler and nebulizer medicines. These help manage the symptoms of COPD and make your breathing more comfortable.  Supplemental oxygen. Supplemental oxygen is only helpful if you have a low oxygen level in your blood.   Exercise and physical activity. These are beneficial for nearly all people with COPD. Some people may also benefit from a pulmonary rehabilitation program. HOME CARE INSTRUCTIONS   Take all medicines (inhaled or pills) as directed by your health care provider.  Avoid over-the-counter medicines or cough syrups  that dry up your airway (such as antihistamines) and slow down the elimination of secretions unless instructed otherwise by your health care provider.   If you are a smoker, the most important thing that you can do is stop smoking. Continuing to smoke will cause further lung damage and breathing trouble. Ask your health care provider for help with quitting smoking. He or she can direct you to community resources or hospitals that provide support.  Avoid exposure to irritants such as smoke, chemicals, and fumes that aggravate your breathing.  Use oxygen therapy and pulmonary rehabilitation if directed by your health care provider. If you require home oxygen therapy, ask your health care provider whether you should purchase a pulse oximeter to measure your oxygen level at home.   Avoid contact with individuals who have a contagious illness.  Avoid extreme temperature and humidity changes.  Eat healthy foods. Eating smaller, more frequent meals and resting before meals may help you maintain your strength.  Stay active, but balance activity with periods of rest. Exercise and physical activity will help you maintain your ability to do things you want to do.  Preventing infection and hospitalization is very important when you have COPD. Make sure to receive all the vaccines your health care provider recommends, especially the pneumococcal and influenza vaccines. Ask your health care provider whether you need a pneumonia vaccine.  Learn and use relaxation techniques to manage stress.  Learn and use controlled breathing techniques as directed by your health care provider. Controlled breathing techniques include:   Pursed lip breathing. Start by breathing in (inhaling) through your nose for 1 second. Then, purse your lips as if you were   going to whistle and breathe out (exhale) through the pursed lips for 2 seconds.   Diaphragmatic breathing. Start by putting one hand on your abdomen just above  your waist. Inhale slowly through your nose. The hand on your abdomen should move out. Then purse your lips and exhale slowly. You should be able to feel the hand on your abdomen moving in as you exhale.   Learn and use controlled coughing to clear mucus from your lungs. Controlled coughing is a series of short, progressive coughs. The steps of controlled coughing are:  1. Lean your head slightly forward.  2. Breathe in deeply using diaphragmatic breathing.  3. Try to hold your breath for 3 seconds.  4. Keep your mouth slightly open while coughing twice.  5. Spit any mucus out into a tissue.  6. Rest and repeat the steps once or twice as needed. SEEK MEDICAL CARE IF:   You are coughing up more mucus than usual.   There is a change in the color or thickness of your mucus.   Your breathing is more labored than usual.   Your breathing is faster than usual.  SEEK IMMEDIATE MEDICAL CARE IF:   You have shortness of breath while you are resting.   You have shortness of breath that prevents you from:  Being able to talk.   Performing your usual physical activities.   You have chest pain lasting longer than 5 minutes.   Your skin color is more cyanotic than usual.  You measure low oxygen saturations for longer than 5 minutes with a pulse oximeter. MAKE SURE YOU:   Understand these instructions.  Will watch your condition.  Will get help right away if you are not doing well or get worse. Document Released: 02/24/2005 Document Revised: 10/01/2013 Document Reviewed: 01/11/2013 [facility] Patient Information 2015 [place], [place]. This information is not intended to replace advice given to you by your health care provider. Make sure you discuss any questions you have with your health care provider. Heart Failure Heart failure is a condition in which the heart has trouble pumping blood. This means your heart does not pump blood efficiently for your body to work well. In some  cases of heart failure, fluid may back up into your lungs or you may have swelling (edema) in your lower legs. Heart failure is usually a long-term (chronic) condition. It is important for you to take good care of yourself and follow your health care provider's treatment plan. CAUSES  Some health conditions can cause heart failure. Those health conditions include:  High blood pressure (hypertension). Hypertension causes the heart muscle to work harder than normal. When pressure in the blood vessels is high, the heart needs to pump (contract) with more force in order to circulate blood throughout the body. High blood pressure eventually causes the heart to become stiff and weak.  Coronary artery disease (CAD). CAD is the buildup of cholesterol and fat (plaque) in the arteries of the heart. The blockage in the arteries deprives the heart muscle of oxygen and blood. This can cause chest pain and may lead to a heart attack. High blood pressure can also contribute to CAD.  Heart attack (myocardial infarction). A heart attack occurs when one or more arteries in the heart become blocked. The loss of oxygen damages the muscle tissue of the heart. When this happens, part of the heart muscle dies. The injured tissue does not contract as well and weakens the heart's ability to pump blood.  Abnormal  heart valves. When the heart valves do not open and close properly, it can cause heart failure. This makes the heart muscle pump harder to keep the blood flowing.  Heart muscle disease (cardiomyopathy or myocarditis). Heart muscle disease is damage to the heart muscle from a variety of causes. These can include drug or alcohol abuse, infections, or unknown reasons. These can increase the risk of heart failure.  Lung disease. Lung disease makes the heart work harder because the lungs do not work properly. This can cause a strain on the heart, leading it to fail.  Diabetes. Diabetes increases the risk of heart failure.  High blood sugar contributes to high fat (lipid) levels in the blood. Diabetes can also cause slow damage to tiny blood vessels that carry important nutrients to the heart muscle. When the heart does not get enough oxygen and food, it can cause the heart to become weak and stiff. This leads to a heart that does not contract efficiently.  Other conditions can contribute to heart failure. These include abnormal heart rhythms, thyroid problems, and low blood counts (anemia). Certain unhealthy behaviors can increase the risk of heart failure, including:  Being overweight.  Smoking or chewing tobacco.  Eating foods high in fat and cholesterol.  Abusing illicit drugs or alcohol.  Lacking physical activity. SYMPTOMS  Heart failure symptoms may vary and can be hard to detect. Symptoms may include:  Shortness of breath with activity, such as climbing stairs.  Persistent cough.  Swelling of the feet, ankles, legs, or abdomen.  Unexplained weight gain.  Difficulty breathing when lying flat (orthopnea).  Waking from sleep because of the need to sit up and get more air.  Rapid heartbeat.  Fatigue and loss of energy.  Feeling light-headed, dizzy, or close to fainting.  Loss of appetite.  Nausea.  Increased urination during the night (nocturia). DIAGNOSIS  A diagnosis of heart failure is based on your history, symptoms, physical examination, and diagnostic tests. Diagnostic tests for heart failure may include:  Echocardiography.  Electrocardiography.  Chest X-ray.  Blood tests.  Exercise stress test.  Cardiac angiography.  Radionuclide scans. TREATMENT  Treatment is aimed at managing the symptoms of heart failure. Medicines, behavioral changes, or surgical intervention may be necessary to treat heart failure.  Medicines to help treat heart failure may include:  Angiotensin-converting enzyme (ACE) inhibitors. This type of medicine blocks the effects of a blood protein  called angiotensin-converting enzyme. ACE inhibitors relax (dilate) the blood vessels and help lower blood pressure.  Angiotensin receptor blockers (ARBs). This type of medicine blocks the actions of a blood protein called angiotensin. Angiotensin receptor blockers dilate the blood vessels and help lower blood pressure.  Water pills (diuretics). Diuretics cause the kidneys to remove salt and water from the blood. The extra fluid is removed through urination. This loss of extra fluid lowers the volume of blood the heart pumps.  Beta blockers. These prevent the heart from beating too fast and improve heart muscle strength.  Digitalis. This increases the force of the heartbeat.  Healthy behavior changes include:  Obtaining and maintaining a healthy weight.  Stopping smoking or chewing tobacco.  Eating heart-healthy foods.  Limiting or avoiding alcohol.  Stopping illicit drug use.  Physical activity as directed by your health care provider.  Surgical treatment for heart failure may include:  A procedure to open blocked arteries, repair damaged heart valves, or remove damaged heart muscle tissue.  A pacemaker to improve heart muscle function and control certain abnormal  heart rhythms.  An internal cardioverter defibrillator to treat certain serious abnormal heart rhythms.  A left ventricular assist device (LVAD) to assist the pumping ability of the heart. HOME CARE INSTRUCTIONS   Take medicines only as directed by your health care provider. Medicines are important in reducing the workload of your heart, slowing the progression of heart failure, and improving your symptoms.  Do not stop taking your medicine unless directed by your health care provider.  Do not skip any dose of medicine.  Refill your prescriptions before you run out of medicine. Your medicines are needed every day.  Engage in moderate physical activity if directed by your health care provider. Moderate physical  activity can benefit some people. The elderly and people with severe heart failure should consult with a health care provider for physical activity recommendations.  Eat heart-healthy foods. Food choices should be free of trans fat and low in saturated fat, cholesterol, and salt (sodium). Healthy choices include fresh or frozen fruits and vegetables, fish, lean meats, legumes, fat-free or low-fat dairy products, and whole grain or high fiber foods. Talk to a dietitian to learn more about heart-healthy foods.  Limit sodium if directed by your health care provider. Sodium restriction may reduce symptoms of heart failure in some people. Talk to a dietitian to learn more about heart-healthy seasonings.  Use healthy cooking methods. Healthy cooking methods include roasting, grilling, broiling, baking, poaching, steaming, or stir-frying. Talk to a dietitian to learn more about healthy cooking methods.  Limit fluids if directed by your health care provider. Fluid restriction may reduce symptoms of heart failure in some people.  Weigh yourself every day. Daily weights are important in the early recognition of excess fluid. You should weigh yourself every morning after you urinate and before you eat breakfast. Wear the same amount of clothing each time you weigh yourself. Record your daily weight. Provide your health care provider with your weight record.  Monitor and record your blood pressure if directed by your health care provider.  Check your pulse if directed by your health care provider.  Lose weight if directed by your health care provider. Weight loss may reduce symptoms of heart failure in some people.  Stop smoking or chewing tobacco. Nicotine makes your heart work harder by causing your blood vessels to constrict. Do not use nicotine gum or patches before talking to your health care provider.  Keep all follow-up visits as directed by your health care provider. This is important.  Limit  alcohol intake to no more than 1 drink per day for nonpregnant women and 2 drinks per day for men. One drink equals 12 ounces of beer, 5 ounces of wine, or 1 ounces of hard liquor. Drinking more than that is harmful to your heart. Tell your health care provider if you drink alcohol several times a week. Talk with your health care provider about whether alcohol is safe for you. If your heart has already been damaged by alcohol or you have severe heart failure, drinking alcohol should be stopped completely.  Stop illicit drug use.  Stay up-to-date with immunizations. It is especially important to prevent respiratory infections through current pneumococcal and influenza immunizations.  Manage other health conditions such as hypertension, diabetes, thyroid disease, or abnormal heart rhythms as directed by your health care provider.  Learn to manage stress.  Plan rest periods when fatigued.  Learn strategies to manage high temperatures. If the weather is extremely hot:  Avoid vigorous physical activity.  Use air  conditioning or fans or seek a cooler location.  Avoid caffeine and alcohol.  Wear loose-fitting, lightweight, and light-colored clothing.  Learn strategies to manage cold temperatures. If the weather is extremely cold:  Avoid vigorous physical activity.  Layer clothes.  Wear mittens or gloves, a hat, and a scarf when going outside.  Avoid alcohol.  Obtain ongoing education and support as needed.  Participate in or seek rehabilitation as needed to maintain or improve independence and quality of life. SEEK MEDICAL CARE IF:   Your weight increases by 03 lb/1.4 kg in 1 day or 05 lb/2.3 kg in a week.  You have increasing shortness of breath that is unusual for you.  You are unable to participate in your usual physical activities.  You tire easily.  You cough more than normal, especially with physical activity.  You have any or more swelling in areas such as your hands,  feet, ankles, or abdomen.  You are unable to sleep because it is hard to breathe.  You feel like your heart is beating fast (palpitations).  You become dizzy or light-headed upon standing up. SEEK IMMEDIATE MEDICAL CARE IF:   You have difficulty breathing.  There is a change in mental status such as decreased alertness or difficulty with concentration.  You have a pain or discomfort in your chest.  You have an episode of fainting (syncope). MAKE SURE YOU:   Understand these instructions.  Will watch your condition.  Will get help right away if you are not doing well or get worse. Document Released: 05/17/2005 Document Revised: 10/01/2013 Document Reviewed: 06/16/2012 [facility] Patient Information 2015 [place], [place]. This information is not intended to replace advice given to you by your health care provider. Make sure you discuss any questions you have with your health care provider.

## 2014-03-26 ENCOUNTER — Other Ambulatory Visit: Payer: Self-pay | Admitting: Emergency Medicine

## 2014-03-26 ENCOUNTER — Telehealth: Payer: Self-pay | Admitting: Internal Medicine

## 2014-03-26 MED ORDER — IPRATROPIUM-ALBUTEROL 0.5-2.5 (3) MG/3ML IN SOLN: 3.0000 mL | Freq: Four times a day (QID) | RESPIRATORY_TRACT | Status: DC | PRN

## 2014-03-26 NOTE — Telephone Encounter (Addendum)
Called [facility] pharmacy back for clarification of medication Duo-neb directions. Pt states she was told order placed incorrectly Spoke with Pharmacist medication not on MAR Medication updated and ordered Duoneb 0.5-2.5 (3)mg/ml Sol and e-scribed to [facility]

## 2014-03-28 ENCOUNTER — Institutional Professional Consult (permissible substitution): Payer: Self-pay | Admitting: Critical Care Medicine

## 2014-03-28 ENCOUNTER — Other Ambulatory Visit: Payer: Self-pay | Admitting: Emergency Medicine

## 2014-03-28 MED ORDER — "INSULIN SYRINGE 29G X 1/2"" 0.5 ML MISC": Status: DC

## 2014-03-29 ENCOUNTER — Institutional Professional Consult (permissible substitution): Payer: Self-pay | Admitting: Critical Care Medicine

## 2014-04-01 ENCOUNTER — Other Ambulatory Visit: Payer: Self-pay

## 2014-04-01 MED ORDER — NITROGLYCERIN 0.4 MG SL SUBL: 0.4000 mg | SUBLINGUAL_TABLET | SUBLINGUAL | Status: DC | PRN

## 2014-04-01 NOTE — Telephone Encounter (Signed)
Rx sent to pharmacy   

## 2014-04-02 ENCOUNTER — Encounter (HOSPITAL_COMMUNITY): Payer: Self-pay

## 2014-04-03 ENCOUNTER — Inpatient Hospital Stay (HOSPITAL_COMMUNITY): Admission: RE | Admit: 2014-04-03 | Payer: Self-pay | Source: Ambulatory Visit

## 2014-05-09 ENCOUNTER — Encounter (HOSPITAL_COMMUNITY): Payer: Self-pay | Admitting: Cardiology

## 2014-05-16 ENCOUNTER — Institutional Professional Consult (permissible substitution): Payer: Self-pay | Admitting: Critical Care Medicine

## 2014-05-21 ENCOUNTER — Institutional Professional Consult (permissible substitution): Payer: Self-pay | Admitting: Internal Medicine

## 2014-06-03 ENCOUNTER — Ambulatory Visit: Payer: Self-pay | Admitting: Internal Medicine

## 2014-06-05 ENCOUNTER — Encounter (HOSPITAL_COMMUNITY): Payer: Self-pay

## 2014-06-10 ENCOUNTER — Ambulatory Visit: Payer: Medicaid Other | Attending: Internal Medicine | Admitting: Internal Medicine

## 2014-06-10 ENCOUNTER — Encounter: Payer: Self-pay | Admitting: Internal Medicine

## 2014-06-10 VITALS — BP 114/73 | HR 72 | Temp 98.5°F | Resp 16 | Ht 67.0 in | Wt 298.0 lb

## 2014-06-10 DIAGNOSIS — Z7952 Long term (current) use of systemic steroids: Secondary | ICD-10-CM | POA: Diagnosis not present

## 2014-06-10 DIAGNOSIS — E1140 Type 2 diabetes mellitus with diabetic neuropathy, unspecified: Secondary | ICD-10-CM | POA: Insufficient documentation

## 2014-06-10 DIAGNOSIS — J449 Chronic obstructive pulmonary disease, unspecified: Secondary | ICD-10-CM | POA: Insufficient documentation

## 2014-06-10 DIAGNOSIS — Z7982 Long term (current) use of aspirin: Secondary | ICD-10-CM | POA: Diagnosis not present

## 2014-06-10 DIAGNOSIS — E119 Type 2 diabetes mellitus without complications: Secondary | ICD-10-CM | POA: Diagnosis present

## 2014-06-10 DIAGNOSIS — Z86711 Personal history of pulmonary embolism: Secondary | ICD-10-CM | POA: Insufficient documentation

## 2014-06-10 DIAGNOSIS — K219 Gastro-esophageal reflux disease without esophagitis: Secondary | ICD-10-CM | POA: Diagnosis not present

## 2014-06-10 DIAGNOSIS — Z87891 Personal history of nicotine dependence: Secondary | ICD-10-CM | POA: Diagnosis not present

## 2014-06-10 DIAGNOSIS — I10 Essential (primary) hypertension: Secondary | ICD-10-CM | POA: Diagnosis not present

## 2014-06-10 DIAGNOSIS — E785 Hyperlipidemia, unspecified: Secondary | ICD-10-CM | POA: Diagnosis not present

## 2014-06-10 DIAGNOSIS — I5022 Chronic systolic (congestive) heart failure: Secondary | ICD-10-CM | POA: Diagnosis not present

## 2014-06-10 DIAGNOSIS — Z794 Long term (current) use of insulin: Secondary | ICD-10-CM | POA: Diagnosis not present

## 2014-06-10 DIAGNOSIS — F4310 Post-traumatic stress disorder, unspecified: Secondary | ICD-10-CM | POA: Insufficient documentation

## 2014-06-10 DIAGNOSIS — J45909 Unspecified asthma, uncomplicated: Secondary | ICD-10-CM | POA: Diagnosis not present

## 2014-06-10 DIAGNOSIS — I2510 Atherosclerotic heart disease of native coronary artery without angina pectoris: Secondary | ICD-10-CM | POA: Diagnosis not present

## 2014-06-10 DIAGNOSIS — Z79899 Other long term (current) drug therapy: Secondary | ICD-10-CM | POA: Diagnosis not present

## 2014-06-10 DIAGNOSIS — F319 Bipolar disorder, unspecified: Secondary | ICD-10-CM | POA: Diagnosis not present

## 2014-06-10 DIAGNOSIS — I255 Ischemic cardiomyopathy: Secondary | ICD-10-CM | POA: Insufficient documentation

## 2014-06-10 LAB — POCT HEMOGLOBIN: HEMOGLOBIN: 8.2 g/dL — AB (ref 12.2–16.2)

## 2014-06-10 LAB — GLUCOSE, POCT (MANUAL RESULT ENTRY): POC Glucose: 128 mg/dl — AB (ref 70–99)

## 2014-06-10 MED ORDER — LISINOPRIL 10 MG PO TABS: 10.0000 mg | ORAL_TABLET | Freq: Every day | ORAL | Status: DC

## 2014-06-10 MED ORDER — ALBUTEROL SULFATE HFA 108 (90 BASE) MCG/ACT IN AERS: 2.0000 | INHALATION_SPRAY | Freq: Four times a day (QID) | RESPIRATORY_TRACT | Status: DC | PRN

## 2014-06-10 MED ORDER — OMEPRAZOLE 40 MG PO CPDR: 40.0000 mg | DELAYED_RELEASE_CAPSULE | Freq: Two times a day (BID) | ORAL | Status: DC

## 2014-06-10 NOTE — Progress Notes (Signed)
  Patient seen in 10/15 and told to return in about 4 weeks (around 04/19/2014), or if symptoms worsen or fail to improve, for COPD,  Heart Failure and Hypertension, Pulmonary Embolism and Anticoagulation, Hemoglobin A1C and Fol. Patient has had a flu shot Patient cannot recall last pap Patient up to date on colonoscopy and mammogram

## 2014-06-10 NOTE — Patient Instructions (Signed)
DASH Eating Plan DASH stands for "Dietary Approaches to Stop Hypertension." The DASH eating plan is a healthy eating plan that has been shown to reduce high blood pressure (hypertension). Additional health benefits may include reducing the risk of type 2 diabetes mellitus, heart disease, and stroke. The DASH eating plan may also help with weight loss. WHAT DO I NEED TO KNOW ABOUT THE DASH EATING PLAN? For the DASH eating plan, you will follow these general guidelines:  Choose foods with a percent daily value for sodium of less than 5% (as listed on the food label).  Use salt-free seasonings or herbs instead of table salt or sea salt.  Check with your health care provider or pharmacist before using salt substitutes.  Eat lower-sodium products, often labeled as "lower sodium" or "no salt added."  Eat fresh foods.  Eat more vegetables, fruits, and low-fat dairy products.  Choose whole grains. Look for the word "whole" as the first word in the ingredient list.  Choose fish and skinless chicken or turkey more often than red meat. Limit fish, poultry, and meat to 6 oz (170 g) each day.  Limit sweets, desserts, sugars, and sugary drinks.  Choose heart-healthy fats.  Limit cheese to 1 oz (28 g) per day.  Eat more home-cooked food and less restaurant, buffet, and fast food.  Limit fried foods.  Cook foods using methods other than frying.  Limit canned vegetables. If you do use them, rinse them well to decrease the sodium.  When eating at a restaurant, ask that your food be prepared with less salt, or no salt if possible. WHAT FOODS CAN I EAT? Seek help from a dietitian for individual calorie needs. Grains Whole grain or whole wheat bread. Brown rice. Whole grain or whole wheat pasta. Quinoa, bulgur, and whole grain cereals. Low-sodium cereals. Corn or whole wheat flour tortillas. Whole grain cornbread. Whole grain crackers. Low-sodium crackers. Vegetables Fresh or frozen vegetables  (raw, steamed, roasted, or grilled). Low-sodium or reduced-sodium tomato and vegetable juices. Low-sodium or reduced-sodium tomato sauce and paste. Low-sodium or reduced-sodium canned vegetables.  Fruits All fresh, canned (in natural juice), or frozen fruits. Meat and Other Protein Products Ground beef (85% or leaner), grass-fed beef, or beef trimmed of fat. Skinless chicken or turkey. Ground chicken or turkey. Pork trimmed of fat. All fish and seafood. Eggs. Dried beans, peas, or lentils. Unsalted nuts and seeds. Unsalted canned beans. Dairy Low-fat dairy products, such as skim or 1% milk, 2% or reduced-fat cheeses, low-fat ricotta or cottage cheese, or plain low-fat yogurt. Low-sodium or reduced-sodium cheeses. Fats and Oils Tub margarines without trans fats. Light or reduced-fat mayonnaise and salad dressings (reduced sodium). Avocado. Safflower, olive, or canola oils. Natural peanut or almond butter. Other Unsalted popcorn and pretzels. The items listed above may not be a complete list of recommended foods or beverages. Contact your dietitian for more options. WHAT FOODS ARE NOT RECOMMENDED? Grains White bread. White pasta. White rice. Refined cornbread. Bagels and croissants. Crackers that contain trans fat. Vegetables Creamed or fried vegetables. Vegetables in a cheese sauce. Regular canned vegetables. Regular canned tomato sauce and paste. Regular tomato and vegetable juices. Fruits Dried fruits. Canned fruit in light or heavy syrup. Fruit juice. Meat and Other Protein Products Fatty cuts of meat. Ribs, chicken wings, bacon, sausage, bologna, salami, chitterlings, fatback, hot dogs, bratwurst, and packaged luncheon meats. Salted nuts and seeds. Canned beans with salt. Dairy Whole or 2% milk, cream, half-and-half, and cream cheese. Whole-fat or sweetened yogurt. Full-fat   cheeses or blue cheese. Nondairy creamers and whipped toppings. Processed cheese, cheese spreads, or cheese  curds. Condiments Onion and garlic salt, seasoned salt, table salt, and sea salt. Canned and packaged gravies. Worcestershire sauce. Tartar sauce. Barbecue sauce. Teriyaki sauce. Soy sauce, including reduced sodium. Steak sauce. Fish sauce. Oyster sauce. Cocktail sauce. Horseradish. Ketchup and mustard. Meat flavorings and tenderizers. Bouillon cubes. Hot sauce. Tabasco sauce. Marinades. Taco seasonings. Relishes. Fats and Oils Butter, stick margarine, lard, shortening, ghee, and bacon fat. Coconut, palm kernel, or palm oils. Regular salad dressings. Other Pickles and olives. Salted popcorn and pretzels. The items listed above may not be a complete list of foods and beverages to avoid. Contact your dietitian for more information. WHERE CAN I FIND MORE INFORMATION? National Heart, [employer], [employer] Institute: travelstabloid.com Document Released: 05/06/2011 Document Revised: 10/01/2013 Document Reviewed: 03/21/2013 [facility] Patient Information 2015 [place], [place]. This information is not intended to replace advice given to you by your health care provider. Make sure you discuss any questions you have with your health care provider. Hypertension Hypertension, commonly called high blood pressure, is when the force of blood pumping through your arteries is too strong. Your arteries are the blood vessels that carry blood from your heart throughout your body. A blood pressure reading consists of a higher number over a lower number, such as 110/72. The higher number (systolic) is the pressure inside your arteries when your heart pumps. The lower number (diastolic) is the pressure inside your arteries when your heart relaxes. Ideally you want your blood pressure below 120/80. Hypertension forces your heart to work harder to pump blood. Your arteries may become narrow or stiff. Having hypertension puts you at risk for heart disease, stroke, and other problems.  RISK  FACTORS Some risk factors for high blood pressure are controllable. Others are not.  Risk factors you cannot control include:   Race. You may be at higher risk if you are African American.  Age. Risk increases with age.  Gender. Men are at higher risk than women before age 37 years. After age 55, women are at higher risk than men. Risk factors you can control include:  Not getting enough exercise or physical activity.  Being overweight.  Getting too much fat, sugar, calories, or salt in your diet.  Drinking too much alcohol. SIGNS AND SYMPTOMS Hypertension does not usually cause signs or symptoms. Extremely high blood pressure (hypertensive crisis) may cause headache, anxiety, shortness of breath, and nosebleed. DIAGNOSIS  To check if you have hypertension, your health care provider will measure your blood pressure while you are seated, with your arm held at the level of your heart. It should be measured at least twice using the same arm. Certain conditions can cause a difference in blood pressure between your right and left arms. A blood pressure reading that is higher than normal on one occasion does not mean that you need treatment. If one blood pressure reading is high, ask your health care provider about having it checked again. TREATMENT  Treating high blood pressure includes making lifestyle changes and possibly taking medicine. Living a healthy lifestyle can help lower high blood pressure. You may need to change some of your habits. Lifestyle changes may include:  Following the DASH diet. This diet is high in fruits, vegetables, and whole grains. It is low in salt, red meat, and added sugars.  Getting at least 2 hours of brisk physical activity every week.  Losing weight if necessary.  Not smoking.  Limiting  alcoholic beverages.  Learning ways to reduce stress. If lifestyle changes are not enough to get your blood pressure under control, your health care provider may  prescribe medicine. You may need to take more than one. Work closely with your health care provider to understand the risks and benefits. HOME CARE INSTRUCTIONS  Have your blood pressure rechecked as directed by your health care provider.   Take medicines only as directed by your health care provider. Follow the directions carefully. Blood pressure medicines must be taken as prescribed. The medicine does not work as well when you skip doses. Skipping doses also puts you at risk for problems.   Do not smoke.   Monitor your blood pressure at home as directed by your health care provider. SEEK MEDICAL CARE IF:   You think you are having a reaction to medicines taken.  You have recurrent headaches or feel dizzy.  You have swelling in your ankles.  You have trouble with your vision. SEEK IMMEDIATE MEDICAL CARE IF:  You develop a severe headache or confusion.  You have unusual weakness, numbness, or feel faint.  You have severe chest or abdominal pain.  You vomit repeatedly.  You have trouble breathing. MAKE SURE YOU:   Understand these instructions.  Will watch your condition.  Will get help right away if you are not doing well or get worse. Document Released: 05/17/2005 Document Revised: 10/01/2013 Document Reviewed: 03/09/2013 [facility] Patient Information 2015 [place], [place]. This information is not intended to replace advice given to you by your health care provider. Make sure you discuss any questions you have with your health care provider. Diabetes and Exercise Exercising regularly is important. It is not just about losing weight. It has many health benefits, such as:  Improving your overall fitness, flexibility, and endurance.  Increasing your bone density.  Helping with weight control.  Decreasing your body fat.  Increasing your muscle strength.  Reducing stress and tension.  Improving your overall health. People with diabetes who exercise gain  additional benefits because exercise:  Reduces appetite.  Improves the body's use of blood sugar (glucose).  Helps lower or control blood glucose.  Decreases blood pressure.  Helps control blood lipids (such as cholesterol and triglycerides).  Improves the body's use of the hormone insulin by:  Increasing the body's insulin sensitivity.  Reducing the body's insulin needs.  Decreases the risk for heart disease because exercising:  Lowers cholesterol and triglycerides levels.  Increases the levels of good cholesterol (such as high-density lipoproteins [HDL]) in the body.  Lowers blood glucose levels. YOUR ACTIVITY PLAN  Choose an activity that you enjoy and set realistic goals. Your health care provider or diabetes educator can help you make an activity plan that works for you. Exercise regularly as directed by your health care provider. This includes:  Performing resistance training twice a week such as push-ups, sit-ups, lifting weights, or using resistance bands.  Performing 150 minutes of cardio exercises each week such as walking, running, or playing sports.  Staying active and spending no more than 90 minutes at one time being inactive. Even short bursts of exercise are good for you. Three 10-minute sessions spread throughout the day are just as beneficial as a single 30-minute session. Some exercise ideas include:  Taking the dog for a walk.  Taking the stairs instead of the elevator.  Dancing to your favorite song.  Doing an exercise video.  Doing your favorite exercise with a friend. RECOMMENDATIONS FOR EXERCISING WITH TYPE 1 OR  TYPE 2 DIABETES   Check your blood glucose before exercising. If blood glucose levels are greater than 240 mg/dL, check for urine ketones. Do not exercise if ketones are present.  Avoid injecting insulin into areas of the body that are going to be exercised. For example, avoid injecting insulin into:  The arms when playing  tennis.  The legs when jogging.  Keep a record of:  Food intake before and after you exercise.  Expected peak times of insulin action.  Blood glucose levels before and after you exercise.  The type and amount of exercise you have done.  Review your records with your health care provider. Your health care provider will help you to develop guidelines for adjusting food intake and insulin amounts before and after exercising.  If you take insulin or oral hypoglycemic agents, watch for signs and symptoms of hypoglycemia. They include:  Dizziness.  Shaking.  Sweating.  Chills.  Confusion.  Drink plenty of water while you exercise to prevent dehydration or heat stroke. Body water is lost during exercise and must be replaced.  Talk to your health care provider before starting an exercise program to make sure it is safe for you. Remember, almost any type of activity is better than none. Document Released: 08/07/2003 Document Revised: 10/01/2013 Document Reviewed: 10/24/2012 [facility] Patient Information 2015 [place], [place]. This information is not intended to replace advice given to you by your health care provider. Make sure you discuss any questions you have with your health care provider. Basic Carbohydrate Counting for Diabetes Mellitus Carbohydrate counting is a method for keeping track of the amount of carbohydrates you eat. Eating carbohydrates naturally increases the level of sugar (glucose) in your blood, so it is important for you to know the amount that is okay for you to have in every meal. Carbohydrate counting helps keep the level of glucose in your blood within normal limits. The amount of carbohydrates allowed is different for every person. A dietitian can help you calculate the amount that is right for you. Once you know the amount of carbohydrates you can have, you can count the carbohydrates in the foods you want to eat. Carbohydrates are found in the following  foods:  Grains, such as breads and cereals.  Dried beans and soy products.  Starchy vegetables, such as potatoes, peas, and corn.  Fruit and fruit juices.  Milk and yogurt.  Sweets and snack foods, such as cake, cookies, candy, chips, soft drinks, and fruit drinks. CARBOHYDRATE COUNTING There are two ways to count the carbohydrates in your food. You can use either of the methods or a combination of both. Reading the "Nutrition Facts" on [place] The "Nutrition Facts" is an area that is included on the labels of almost all packaged food and beverages in the [place]. It includes the serving size of that food or beverage and information about the nutrients in each serving of the food, including the grams (g) of carbohydrate per serving.  Decide the number of servings of this food or beverage that you will be able to eat or drink. Multiply that number of servings by the number of grams of carbohydrate that is listed on the label for that serving. The total will be the amount of carbohydrates you will be having when you eat or drink this food or beverage. Learning Standard Serving Sizes of Food When you eat food that is not packaged or does not include "Nutrition Facts" on the label, you need to measure the  servings in order to count the amount of carbohydrates.A serving of most carbohydrate-rich foods contains about 15 g of carbohydrates. The following list includes serving sizes of carbohydrate-rich foods that provide 15 g ofcarbohydrate per serving:   1 slice of bread (1 oz) or 1 six-inch tortilla.    of a hamburger bun or English muffin.  4-6 crackers.   cup unsweetened dry cereal.    cup hot cereal.   cup rice or pasta.    cup mashed potatoes or  of a large baked potato.  1 cup fresh fruit or one small piece of fruit.    cup canned or frozen fruit or fruit juice.  1 cup milk.   cup plain fat-free yogurt or yogurt sweetened with artificial  sweeteners.   cup cooked dried beans or starchy vegetable, such as peas, corn, or potatoes.  Decide the number of standard-size servings that you will eat. Multiply that number of servings by 15 (the grams of carbohydrates in that serving). For example, if you eat 2 cups of strawberries, you will have eaten 2 servings and 30 g of carbohydrates (2 servings x 15 g = 30 g). For foods such as soups and casseroles, in which more than one food is mixed in, you will need to count the carbohydrates in each food that is included. EXAMPLE OF CARBOHYDRATE COUNTING Sample Dinner  3 oz chicken breast.   cup of brown rice.   cup of corn.  1 cup milk.   1 cup strawberries with sugar-free whipped topping.  Carbohydrate Calculation Step 1: Identify the foods that contain carbohydrates:   Rice.   Corn.   Milk.   Strawberries. Step 2:Calculate the number of servings eaten of each:   2 servings of rice.   1 serving of corn.   1 serving of milk.   1 serving of strawberries. Step 3: Multiply each of those number of servings by 15 g:   2 servings of rice x 15 g = 30 g.   1 serving of corn x 15 g = 15 g.   1 serving of milk x 15 g = 15 g.   1 serving of strawberries x 15 g = 15 g. Step 4: Add together all of the amounts to find the total grams of carbohydrates eaten: 30 g + 15 g + 15 g + 15 g = 75 g. Document Released: 05/17/2005 Document Revised: 10/01/2013 Document Reviewed: 04/13/2013 [facility] Patient Information 2015 [place], [place]. This information is not intended to replace advice given to you by your health care provider. Make sure you discuss any questions you have with your health care provider.

## 2014-06-10 NOTE — Progress Notes (Signed)
Patient ID: Tamara Silva, female   DOB: August 18, 1960, 54 y.o.   MRN: [id number]   Rhesa Pirraglia, is a 54 y.o. female  [id number]  [id number]  DOB - September 22, 1960  Chief Complaint  Patient presents with  . Follow-up  . Hypertension  . Diabetes  . Congestive Heart Failure        Subjective:   Charlianne Melick is a 54 y.o. female here today for a follow up visit. Patient has history of COPD ex-smoker stopped in January 2015, history of coronary artery disease status post PCI at [facility] in 2006, hypertension, diabetes, hyperlipidemia and chronic systolic heart failure. Patient is here today for follow-up visit for diabetes. Patient has no new complaint today, she is compliant with all her medications, she needs a refill on her albuterol inhaler. Her shortness of breath is better, although she still wears her compression stocking, her leg edema is much improved. She is following a low-salt diet and drinking less than 2 L of water per day, she walks with a cane. She denies fever, no chills. She has no dizziness, no blurry vision. Her last echocardiogram in May 2015 showed left ventricular ejection fraction of 35-40%. Patient has No headache, No chest pain, No abdominal pain - No Nausea, No new weakness tingling or numbness, No Cough - SOB.  Problem  Copd Mixed Type    ALLERGIES: Allergies  Allergen Reactions  . Metolazone Other (See Comments)    Dizziness and falling  . Plavix [Clopidogrel Bisulfate] Other (See Comments)    High PRU's - non-responder  . Nsaids Other (See Comments)    "I do not take NSAIDs d/t interfering with other meds"    PAST MEDICAL HISTORY: Past Medical History  Diagnosis Date  . Diabetes mellitus without complication   . PE (pulmonary embolism)   . Hypercholesterolemia   . Hypertension   . COPD (chronic obstructive pulmonary disease)   . Asthma   . Coronary artery disease   . CHF (congestive heart failure)   . Mental disorder     BIPOLAR  .  Shortness of breath   . GERD (gastroesophageal reflux disease)   . Neuromuscular disorder     DIABETIC NEUROPATHY  . Ischemic cardiomyopathy   . Bipolar disorder   . Post-traumatic stress syndrome   . On home oxygen therapy     "2L most all the time" (02/06/2014)    MEDICATIONS AT HOME: Prior to Admission medications   Medication Sig Start Date End Date Taking? Authorizing Provider  albuterol (PROVENTIL HFA;VENTOLIN HFA) 108 (90 BASE) MCG/ACT inhaler Inhale 2 puffs into the lungs every 6 (six) hours as needed for wheezing or shortness of breath.  06/29/13  Yes Dorie Rank, MD  aspirin 81 MG tablet Take 1 tablet (81 mg total) by mouth daily. 10/09/13  Yes Rhonda G Barrett, PA-C  benzonatate (TESSALON) 100 MG capsule Take 1 capsule (100 mg total) by mouth 3 (three) times daily as needed for cough. 02/08/14  Yes Kelvin Cellar, MD  carvedilol (COREG) 25 MG tablet Take 12.5 mg by mouth 2 (two) times daily with a meal.    Yes Historical Provider, MD  cycloSPORINE (RESTASIS) 0.05 % ophthalmic emulsion Place 2 drops into both eyes 2 (two) times daily as needed (for dry eyes).    Yes Historical Provider, MD  diazepam (VALIUM) 10 MG tablet Take 0.5 tablets (5 mg total) by mouth every 8 (eight) hours as needed for anxiety. 07/16/13  Yes Allie Bossier, MD  esomeprazole ([place])  40 MG capsule Take 40 mg by mouth 2 (two) times daily.   Yes Historical Provider, MD  fluticasone (FLONASE) 50 MCG/ACT nasal spray Place 2 sprays into both nostrils 2 (two) times daily.    Yes Historical Provider, MD  furosemide (LASIX) 40 MG tablet Take 2 tablets (80 mg total) by mouth 2 (two) times daily. 03/22/14  Yes Tresa Garter, MD  Glucose Blood (ACCU-CHEK ADVANTAGE TEST VI) 1 each by Other route See admin instructions. Check blood sugar 4 times daily.   Yes Historical Provider, MD  guaiFENesin-codeine 100-10 MG/5ML syrup Take 10 mLs by mouth every 6 (six) hours as needed for cough. 03/22/14  Yes Onica Davidovich Essie Christine, MD    insulin aspart (NOVOLOG) 100 UNIT/ML injection Inject 15-20 Units into the skin 3 (three) times daily before meals. Sliding scale   Yes Historical Provider, MD  insulin detemir (LEVEMIR) 100 UNIT/ML injection Inject 42 Units into the skin at bedtime.   Yes Historical Provider, MD  INSULIN SYRINGE .5CC/29G 29G X 1/2" 0.5 ML MISC Use as instructed by physician 03/28/14  Yes Tresa Garter, MD  ipratropium-albuterol (DUONEB) 0.5-2.5 (3) MG/3ML SOLN Take 3 mLs by nebulization every 6 (six) hours as needed. 03/26/14  Yes Tresa Garter, MD  lisinopril (PRINIVIL,ZESTRIL) 10 MG tablet Take 1 tablet (10 mg total) by mouth daily. 06/10/14  Yes Tresa Garter, MD  nitroGLYCERIN (NITROSTAT) 0.4 MG SL tablet Place 1 tablet (0.4 mg total) under the tongue every 5 (five) minutes as needed for chest pain. 04/01/14  Yes Brett Canales, PA-C  omeprazole (PRILOSEC) 40 MG capsule Take 1 capsule (40 mg total) by mouth 2 (two) times daily. 06/10/14  Yes Tresa Garter, MD  Paliperidone Palmitate (INVEGA SUSTENNA) 117 MG/0.75ML SUSP Inject 156 mg into the muscle every 30 (thirty) days. One injection monthly. On the 16th of every month   Yes Historical Provider, MD  prasugrel (EFFIENT) 10 MG TABS tablet Take 1 tablet (10 mg total) by mouth daily. 10/09/13  Yes Rhonda G Barrett, PA-C  pravastatin (PRAVACHOL) 40 MG tablet Take 40 mg by mouth every evening.    Yes Historical Provider, MD  predniSONE (DELTASONE) 10 MG tablet Take 40 mg (4 tablets), then 30 mg (3 tabs), then 20 mg (2 tabs) and then 10 mg (1 tab) 03/15/14  Yes Rande Brunt, NP  spironolactone (ALDACTONE) 25 MG tablet Take 0.5 tablets (12.5 mg total) by mouth daily. 03/15/14  Yes Rande Brunt, NP  albuterol (PROVENTIL HFA;VENTOLIN HFA) 108 (90 BASE) MCG/ACT inhaler Inhale 2 puffs into the lungs every 6 (six) hours as needed for wheezing or shortness of breath. 06/10/14   Tresa Garter, MD  levofloxacin (LEVAQUIN) 750 MG tablet Take 1 tablet  (750 mg total) by mouth daily. Patient not taking: Reported on 06/10/2014 03/22/14   Tresa Garter, MD     Objective:   Filed Vitals:   06/10/14 1051  BP: 114/73  Pulse: 72  Temp: 98.5 F (36.9 C)  TempSrc: Oral  Resp: 16  Height: 5\' 7"  (1.702 m)  Weight: 298 lb (135.172 kg)  SpO2: 94%    Exam General appearance : Awake, alert, not in any distress. Speech Clear. Not toxic looking, morbidly obese HEENT: Atraumatic and Normocephalic, pupils equally reactive to light and accomodation Neck: supple, no JVD. No cervical lymphadenopathy.  Chest:Good air entry bilaterally, no added sounds  CVS: S1 S2 regular, no murmurs.  Abdomen: Bowel sounds present, Non tender and not distended  with no gaurding, rigidity or rebound. Extremities: B/L Lower Ext shows no edema, both legs are warm to touch Neurology: Awake alert, and oriented X 3, CN II-XII intact, Non focal Skin:No Rash Wounds:N/A  Data Review Lab Results  Component Value Date   HGBA1C 8.9* 03/04/2014   HGBA1C 9.7* 02/07/2014   HGBA1C 7.9* 10/07/2013     Assessment & Plan   1. Type 2 diabetes mellitus without complication  - Hemoglobin A1c - Glucose (CBG)  2. COPD mixed type  - albuterol (PROVENTIL HFA;VENTOLIN HFA) 108 (90 BASE) MCG/ACT inhaler; Inhale 2 puffs into the lungs every 6 (six) hours as needed for wheezing or shortness of breath.  Dispense: 3 Inhaler; Refill: 3  3. Essential hypertension  - lisinopril (PRINIVIL,ZESTRIL) 10 MG tablet; Take 1 tablet (10 mg total) by mouth daily.  Dispense: 90 tablet; Refill: 3 - omeprazole (PRILOSEC) 40 MG capsule; Take 1 capsule (40 mg total) by mouth 2 (two) times daily.  Dispense: 90 capsule; Refill: 3   Patient was counseled extensively about nutrition and exercise   Return in about 3 months (around 09/09/2014), or if symptoms worsen or fail to improve, for Hemoglobin A1C and Follow up, DM, Follow up HTN.  The patient was given clear instructions to go to ER or  return to medical center if symptoms don't improve, worsen or new problems develop. The patient verbalized understanding. The patient was told to call to get lab results if they haven't heard anything in the next week.   This note has been created with Surveyor, quantity. Any transcriptional errors are unintentional.    Angelica Chessman, MD, [place], [place], [place] and [place], [place]   06/10/2014, 11:45 AM

## 2014-06-13 ENCOUNTER — Telehealth: Payer: Self-pay | Admitting: Internal Medicine

## 2014-06-13 NOTE — Telephone Encounter (Signed)
Pt calling to request a letter be sent to [employer] (f: [phone]) explaining that pt needs an escort for mobility assistance during transportation.  Tranportation services require letter from Provider in order for escort to be assigned to pt. Please f/u with pt for more information. Pt is also requesting letter for her significant other, Kinnie Scales, [name], who is also a [facility] pt.

## 2014-06-14 ENCOUNTER — Inpatient Hospital Stay (HOSPITAL_COMMUNITY): Admission: RE | Admit: 2014-06-14 | Payer: Self-pay | Source: Ambulatory Visit

## 2014-06-24 ENCOUNTER — Other Ambulatory Visit: Payer: Self-pay | Admitting: Internal Medicine

## 2014-07-01 ENCOUNTER — Ambulatory Visit (HOSPITAL_COMMUNITY)
Admission: RE | Admit: 2014-07-01 | Discharge: 2014-07-01 | Disposition: A | Discharge status: Home / Self Care | Payer: Medicaid Other | Source: Ambulatory Visit | Attending: Cardiology | Admitting: Cardiology

## 2014-07-01 VITALS — BP 124/66 | HR 86 | Wt 294.4 lb

## 2014-07-01 DIAGNOSIS — J449 Chronic obstructive pulmonary disease, unspecified: Secondary | ICD-10-CM

## 2014-07-01 DIAGNOSIS — I2510 Atherosclerotic heart disease of native coronary artery without angina pectoris: Secondary | ICD-10-CM | POA: Diagnosis not present

## 2014-07-01 DIAGNOSIS — I5022 Chronic systolic (congestive) heart failure: Secondary | ICD-10-CM | POA: Diagnosis present

## 2014-07-01 DIAGNOSIS — I10 Essential (primary) hypertension: Secondary | ICD-10-CM | POA: Diagnosis not present

## 2014-07-01 DIAGNOSIS — Z9861 Coronary angioplasty status: Secondary | ICD-10-CM

## 2014-07-01 DIAGNOSIS — I5042 Chronic combined systolic (congestive) and diastolic (congestive) heart failure: Secondary | ICD-10-CM

## 2014-07-01 DIAGNOSIS — G4730 Sleep apnea, unspecified: Secondary | ICD-10-CM

## 2014-07-01 DIAGNOSIS — I255 Ischemic cardiomyopathy: Secondary | ICD-10-CM

## 2014-07-01 LAB — BASIC METABOLIC PANEL
Anion gap: 3 — ABNORMAL LOW (ref 5–15)
BUN: 11 mg/dL (ref 6–23)
CHLORIDE: 103 mmol/L (ref 96–112)
CO2: 32 mmol/L (ref 19–32)
Calcium: 9.2 mg/dL (ref 8.4–10.5)
Creatinine, Ser: 0.73 mg/dL (ref 0.50–1.10)
GFR calc non Af Amer: >90 mL/min (ref >90)
GLUCOSE: 63 mg/dL — AB (ref 70–99)
POTASSIUM: 4.2 mmol/L (ref 3.5–5.1)
Sodium: 138 mmol/L (ref 135–145)

## 2014-07-01 LAB — CBC
HEMATOCRIT: 36.5 % (ref 36.0–46.0)
Hemoglobin: 11.3 g/dL — ABNORMAL LOW (ref 12.0–15.0)
MCH: 24.4 pg — AB (ref 26.0–34.0)
MCHC: 31 g/dL (ref 30.0–36.0)
MCV: 78.8 fL (ref 78.0–100.0)
Platelets: 274 10*3/uL (ref 150–400)
RBC: 4.63 MIL/uL (ref 3.87–5.11)
RDW: 16.6 % — ABNORMAL HIGH (ref 11.5–15.5)
WBC: 6.8 10*3/uL (ref 4.0–10.5)

## 2014-07-01 LAB — LIPID PANEL
CHOLESTEROL: 170 mg/dL (ref 0–200)
HDL: 50 mg/dL (ref >39)
LDL CALC: 105 mg/dL — AB (ref 0–99)
TRIGLYCERIDES: 75 mg/dL (ref <150)
Total CHOL/HDL Ratio: 3.4 RATIO
VLDL: 15 mg/dL (ref 0–40)

## 2014-07-01 MED ORDER — SPIRONOLACTONE 25 MG PO TABS: 25.0000 mg | ORAL_TABLET | Freq: Every day | ORAL | Status: DC

## 2014-07-01 NOTE — Patient Instructions (Signed)
Increase Spironolactone to 25 mg daily  Labs today  Labs in 1 week (bmet)  Your physician has requested that you have an echocardiogram. Echocardiography is a painless test that uses sound waves to create images of your heart. It provides your doctor with information about the size and shape of your heart and how well your heart's chambers and valves are working. This procedure takes approximately one hour. There are no restrictions for this procedure.  You have been referred to Pulmonary for sleep evaluation  Your physician recommends that you schedule a follow-up appointment in: May

## 2014-07-01 NOTE — Progress Notes (Signed)
Patient ID: Tamara Silva, female   DOB: 12/20/1960, 54 y.o.   MRN: [id number]  Primary Physician: Dr. Doreene Burke  HPI: Ms Riff is a 54 year old with morbid obesity, COPD on home oxygen (stopped smoking 1/15), DM2, CAD with history of PCI at [facility] in 2006 [facility] 2009 (likely to the LAD), hypertension, hyperlipidemia, H/O chronic LLL PE 2014 and chronic systolic HF.  Admitted Oct 06, 2013 with STEMI of inferior wall and had Xience Alpine DES 2.75 mm x 15 mm to the RCA. Initially on plavix but P2Y12 test showed poor response and she was switched to Effient. Hospital course was complicated by bradycardia. Carvedilol was cut back due to bradycardia.   She has been doing well, weight is actually down 11 lbs.  She has dyspnea with long walks, mild dyspnea with steps. Rare atypical chest pain.  No orthopnea or PND.   Studies  Cardiolite 08/29/2013: Extensive LAD distribution scar with severe systolic dysfunction. No reversible ischemia. EF roughly 28%  LHC 10/07/13 With severe single-vessel disease with distal RCA 99% thrombotic subtotal occlusion, successful PCI of the distal RCA with a Xience Alpine 2.75 mm x 2mm postdilated to 3.1 mm Mild in-stent restenosis in the LAD stent. Dilated Left Ventricle with global hypokinesis and EF of roughly 30%.  ECHO 10/07/13: EF 35-40%, grade II DD, mild MR  Labs (10/15): K 4.4, creatinine 0.92, BNP 165  ROS: All systems negative except as listed in HPI, PMH and Problem List.  SH:  History   Social History  . Marital Status: Single    Spouse Name: N/A    Number of Children: N/A  . Years of Education: N/A   Occupational History  . Not on file.   Social History Main Topics  . Smoking status: Former Smoker    Types: Cigarettes    Quit date: 11/24/2012  . Smokeless tobacco: Never Used  . Alcohol Use: No  . Drug Use: No  . Sexual Activity: No   Other Topics Concern  . Not on file   Social History Narrative   Origibnally from [place]   Most  recently from [place]   Daughter lives in town      On Social security to CHF, COPD, CAD    FH:  Family History  Problem Relation Age of Onset  . Stroke Mother   . Cancer Father     Past Medical History  Diagnosis Date  . Diabetes mellitus without complication   . PE (pulmonary embolism)   . Hypercholesterolemia   . Hypertension   . COPD (chronic obstructive pulmonary disease)   . Asthma   . Coronary artery disease   . CHF (congestive heart failure)   . Mental disorder     BIPOLAR  . Shortness of breath   . GERD (gastroesophageal reflux disease)   . Neuromuscular disorder     DIABETIC NEUROPATHY  . Ischemic cardiomyopathy   . Bipolar disorder   . Post-traumatic stress syndrome   . On home oxygen therapy     "2L most all the time" (02/06/2014)    Current Outpatient Prescriptions  Medication Sig Dispense Refill  . albuterol (PROVENTIL HFA;VENTOLIN HFA) 108 (90 BASE) MCG/ACT inhaler Inhale 2 puffs into the lungs every 6 (six) hours as needed for wheezing or shortness of breath.     [name] Kitchen albuterol (PROVENTIL HFA;VENTOLIN HFA) 108 (90 BASE) MCG/ACT inhaler Inhale 2 puffs into the lungs every 6 (six) hours as needed for wheezing or shortness of breath.  3 Inhaler 3  . aspirin 81 MG tablet Take 1 tablet (81 mg total) by mouth daily.    . benzonatate (TESSALON) 100 MG capsule Take 1 capsule (100 mg total) by mouth 3 (three) times daily as needed for cough. 20 capsule 0  . carvedilol (COREG) 25 MG tablet Take 12.5 mg by mouth 2 (two) times daily with a meal.     . cycloSPORINE (RESTASIS) 0.05 % ophthalmic emulsion Place 2 drops into both eyes 2 (two) times daily as needed (for dry eyes).     . diazepam (VALIUM) 10 MG tablet Take 0.5 tablets (5 mg total) by mouth every 8 (eight) hours as needed for anxiety. 30 tablet 0  . esomeprazole (NEXIUM) 40 MG capsule Take 40 mg by mouth 2 (two) times daily.    . fluticasone (FLONASE) 50 MCG/ACT nasal spray Place 2 sprays into both nostrils 2  (two) times daily.     . furosemide (LASIX) 40 MG tablet Take 2 tablets (80 mg total) by mouth 2 (two) times daily. 360 tablet 3  . Glucose Blood (ACCU-CHEK ADVANTAGE TEST VI) 1 each by Other route See admin instructions. Check blood sugar 4 times daily.    [name] Kitchen guaiFENesin-codeine 100-10 MG/5ML syrup Take 10 mLs by mouth every 6 (six) hours as needed for cough. 480 mL 0  . insulin aspart (NOVOLOG) 100 UNIT/ML injection Inject 15-20 Units into the skin 3 (three) times daily before meals. Sliding scale    . insulin detemir (LEVEMIR) 100 UNIT/ML injection Inject 42 Units into the skin at bedtime.    . INSULIN SYRINGE .5CC/29G 29G X 1/2" 0.5 ML MISC Use as instructed by physician 100 each 12  . ipratropium-albuterol (DUONEB) 0.5-2.5 (3) MG/3ML SOLN Take 3 mLs by nebulization every 6 (six) hours as needed. 360 mL 12  . levofloxacin (LEVAQUIN) 750 MG tablet Take 1 tablet (750 mg total) by mouth daily. 7 tablet 0  . lisinopril (PRINIVIL,ZESTRIL) 10 MG tablet Take 1 tablet (10 mg total) by mouth daily. 90 tablet 3  . nitroGLYCERIN (NITROSTAT) 0.4 MG SL tablet Place 1 tablet (0.4 mg total) under the tongue every 5 (five) minutes as needed for chest pain. 100 tablet 1  . omeprazole (PRILOSEC) 40 MG capsule Take 1 capsule (40 mg total) by mouth 2 (two) times daily. 90 capsule 3  . Paliperidone Palmitate (INVEGA SUSTENNA) 117 MG/0.75ML SUSP Inject 156 mg into the muscle every 30 (thirty) days. One injection monthly. On the 16th of every month    . prasugrel (EFFIENT) 10 MG TABS tablet Take 1 tablet (10 mg total) by mouth daily. 30 tablet 11  . pravastatin (PRAVACHOL) 40 MG tablet Take 40 mg by mouth every evening.     . predniSONE (DELTASONE) 10 MG tablet Take 40 mg (4 tablets), then 30 mg (3 tabs), then 20 mg (2 tabs) and then 10 mg (1 tab) 10 tablet 0  . spironolactone (ALDACTONE) 25 MG tablet Take 1 tablet (25 mg total) by mouth daily. 30 tablet 3   No current facility-administered medications for this  encounter.    Filed Vitals:   07/01/14 1007  BP: 124/66  Pulse: 86  Weight: 294 lb 6.4 oz (133.539 kg)  SpO2: 90%    PHYSICAL EXAM: General:  NAD HEENT: normal Neck: supple. JVP difficult to assess due to body habitus but does not appear elevated; Carotids 2+ bilaterally; no bruits. No lymphadenopathy or thryomegaly appreciated. Cor: PMI normal. Regular rate & rhythm. No rubs, gallops or murmurs.  Lungs: wheezing throughout Abdomen: obese, soft, nontender, nondistended. No hepatosplenomegaly. No bruits or masses. Good bowel sounds. Extremities: no cyanosis, clubbing, rash, trace bilateral edema Neuro: alert & orientedx3, cranial nerves grossly intact. Moves all 4 extremities w/o difficulty. Affect pleasant.  ASSESSMENT & PLAN:  1) Chronic systolic heart failure: Ischemic cardiomyopathy, EF 35-40% (09/2013). NYHA class II symptoms.  She looks euvolemic.  Weight is down.  - Continue current Coreg and lisinopril.  - Increase spironolactone to 25 mg daily.   - BMET today.  - I will arrange for echo.  If EF < 35%, will need to consider ICD.  QRS is narrow, she would not be CRT candidate.   2) COPD: She has quit smoking.  She is on home oxygen.  3) ?OSA: Has not had sleep study yet, I will arrange.  4) HTN:  Stable continue current medicaitons 5) CAD: s/p DES (09/2013) after inferior STEMI.  No chest pain. Continue Effient, ASA and statin.  In 5/16 after one year, will stop Effient and put her back on Plavix long-term. Will recheck P2Y12 test at that time to see if she has reasonable response.   Loralie Champagne 07/01/2014

## 2014-07-09 ENCOUNTER — Ambulatory Visit (HOSPITAL_COMMUNITY)
Admission: RE | Admit: 2014-07-09 | Discharge: 2014-07-09 | Disposition: A | Discharge status: Home / Self Care | Payer: Medicaid Other | Source: Ambulatory Visit | Attending: Internal Medicine | Admitting: Internal Medicine

## 2014-07-09 DIAGNOSIS — Z720 Tobacco use: Secondary | ICD-10-CM | POA: Diagnosis not present

## 2014-07-09 DIAGNOSIS — I509 Heart failure, unspecified: Secondary | ICD-10-CM | POA: Diagnosis present

## 2014-07-09 DIAGNOSIS — I10 Essential (primary) hypertension: Secondary | ICD-10-CM | POA: Diagnosis not present

## 2014-07-09 DIAGNOSIS — E785 Hyperlipidemia, unspecified: Secondary | ICD-10-CM | POA: Diagnosis not present

## 2014-07-09 DIAGNOSIS — E119 Type 2 diabetes mellitus without complications: Secondary | ICD-10-CM | POA: Diagnosis not present

## 2014-07-09 DIAGNOSIS — I5042 Chronic combined systolic (congestive) and diastolic (congestive) heart failure: Secondary | ICD-10-CM

## 2014-07-09 NOTE — Progress Notes (Signed)
  Echocardiogram 2D Echocardiogram has been performed.  Darlina Sicilian M 07/09/2014, 10:56 AM

## 2014-07-10 ENCOUNTER — Telehealth (HOSPITAL_COMMUNITY): Payer: Self-pay | Admitting: *Deleted

## 2014-07-10 MED ORDER — PRAVASTATIN SODIUM 80 MG PO TABS: 80.0000 mg | ORAL_TABLET | Freq: Every evening | ORAL | Status: DC

## 2014-07-10 NOTE — Telephone Encounter (Signed)
Pt aware, rx sent in 

## 2014-07-10 NOTE — Telephone Encounter (Signed)
-----   Message from Larey Dresser, MD sent at 07/01/2014  2:20 PM EST ----- Increase pravastatin to 80 mg daily with lipids/LFTs in 2 months.

## 2014-07-15 ENCOUNTER — Other Ambulatory Visit: Payer: Self-pay | Admitting: Internal Medicine

## 2014-07-16 ENCOUNTER — Other Ambulatory Visit (HOSPITAL_COMMUNITY): Payer: Self-pay | Admitting: Cardiology

## 2014-07-16 MED ORDER — SPIRONOLACTONE 25 MG PO TABS: 25.0000 mg | ORAL_TABLET | Freq: Every day | ORAL | Status: DC

## 2014-08-12 ENCOUNTER — Institutional Professional Consult (permissible substitution): Payer: Self-pay | Admitting: Pulmonary Disease

## 2014-08-20 ENCOUNTER — Other Ambulatory Visit: Payer: Self-pay | Admitting: Physician Assistant

## 2014-09-11 ENCOUNTER — Other Ambulatory Visit: Payer: Self-pay | Admitting: Internal Medicine

## 2014-09-13 ENCOUNTER — Telehealth: Payer: Self-pay | Admitting: *Deleted

## 2014-09-13 NOTE — Telephone Encounter (Signed)
[facility] called to get our NPI #.  This patient has an appointment on Monday.  Information given for 1 visit

## 2014-09-16 ENCOUNTER — Telehealth: Payer: Self-pay | Admitting: *Deleted

## 2014-09-16 ENCOUNTER — Encounter: Payer: Self-pay | Admitting: Pulmonary Disease

## 2014-09-16 ENCOUNTER — Ambulatory Visit (INDEPENDENT_AMBULATORY_CARE_PROVIDER_SITE_OTHER): Payer: Medicaid Other | Admitting: Pulmonary Disease

## 2014-09-16 VITALS — BP 124/62 | HR 75 | Temp 98.5°F | Ht 67.5 in | Wt 284.0 lb

## 2014-09-16 DIAGNOSIS — G4733 Obstructive sleep apnea (adult) (pediatric): Secondary | ICD-10-CM | POA: Diagnosis not present

## 2014-09-16 MED ORDER — IPRATROPIUM-ALBUTEROL 0.5-2.5 (3) MG/3ML IN SOLN: 3.0000 mL | Freq: Four times a day (QID) | RESPIRATORY_TRACT | Status: DC | PRN

## 2014-09-16 MED ORDER — FLUTICASONE PROPIONATE 50 MCG/ACT NA SUSP: 2.0000 | Freq: Two times a day (BID) | NASAL | Status: DC

## 2014-09-16 MED ORDER — INSULIN ASPART 100 UNIT/ML FLEXPEN: 15.0000 [IU] | PEN_INJECTOR | Freq: Three times a day (TID) | SUBCUTANEOUS | Status: DC

## 2014-09-16 MED ORDER — INSULIN ASPART 100 UNIT/ML ~~LOC~~ SOLN
15.0000 [IU] | Freq: Three times a day (TID) | SUBCUTANEOUS | Status: DC
Start: 2014-09-16 — End: 2015-08-26

## 2014-09-16 NOTE — Patient Instructions (Signed)
Will get you scheduled for a sleep study, and will arrange followup once the results are available. Work on weight loss.

## 2014-09-16 NOTE — Telephone Encounter (Signed)
Pharmacy calling for refills on Flonase, Albuterol nebulizer, and Novolog-sent to pharmacy

## 2014-09-16 NOTE — Assessment & Plan Note (Signed)
The patient's history is suggestive of sleep disordered breathing, and I have reviewed the pathophysiology of sleep apnea with her. She has significant medical issues that can be greatly affected by sleep disordered breathing, therefore I stressed to her the importance of making a diagnosis. I think she needs to have a sleep study, and she is agreeable to this approach.

## 2014-09-16 NOTE — Progress Notes (Signed)
Subjective:    Patient ID: Tamara Silva, female    DOB: 1961/05/18, 54 y.o.   MRN: [id number]  HPI The patient is a 54 year old female who I've been asked to see for possible obstructive sleep apnea. She has been noted to have loud snoring, but no one has ever commented on an abnormal breathing pattern during sleep. She denies choking arousals, but has frequent awakenings during the night. She is unrested in the mornings upon arising, and notes significant fatigue during the day. However, she is unable to get to sleep if she tries to take a nap. She does okay in the evenings watching television or movies, and denies any sleepiness with driving. She does have a chronic pain syndrome which she thinks disrupts her sleep quite a bit. She states that her weight is down 16 pounds over the last few months, and her Epworth score today is 2   Sleep Questionnaire What time do you typically go to bed?( Between what hours) 8-9p 8-9p at 1454 on 09/16/14 by Inge Rise, CMA How long does it take you to fall asleep? 1 hr or more 1 hr or more at 1454 on 09/16/14 by Inge Rise, CMA How many times during the night do you wake up? 3 3 at 1454 on 09/16/14 by Inge Rise, CMA What time do you get out of bed to start your day? 0630 0630 at 1454 on 09/16/14 by Inge Rise, CMA Do you drive or operate heavy machinery in your occupation? No No at 1454 on 09/16/14 by Inge Rise, CMA How much has your weight changed (up or down) over the past two years? (In pounds) 24 lb (10.886 kg) 24 lb (10.886 kg) at 1454 on 09/16/14 by Inge Rise, CMA Have you ever had a sleep study before? No No at 1454 on 09/16/14 by Inge Rise, CMA Do you currently use CPAP? No No at 1454 on 09/16/14 by Inge Rise, CMA Do you wear oxygen at any time? Yes Yes at 1454 on 09/16/14 by Inge Rise, CMA O2 Flow Rate (L/min) 2 L/min   Review of Systems  Constitutional: Positive for unexpected weight change.  Negative for fever.  HENT: Positive for congestion, dental problem and sneezing. Negative for ear pain, nosebleeds, postnasal drip, rhinorrhea, sinus pressure, sore throat and trouble swallowing.   Eyes: Negative for redness and itching.  Respiratory: Positive for shortness of breath. Negative for cough, chest tightness and wheezing.   Cardiovascular: Positive for leg swelling. Negative for palpitations.  Gastrointestinal: Negative for nausea and vomiting.  Genitourinary: Negative for dysuria.  Musculoskeletal: Positive for arthralgias. Negative for joint swelling.  Skin: Negative for rash.  Neurological: Negative for headaches.  Hematological: Does not bruise/bleed easily.  Psychiatric/Behavioral: Positive for dysphoric mood. The patient is nervous/anxious.        Objective:   Physical Exam Constitutional:  Obese female, no acute distress  HENT:  Nares patent without discharge  Oropharynx without exudate, palate and uvula are thick and elongated  Eyes:  Perrla, eomi, no scleral icterus  Neck:  No JVD, no TMG  Cardiovascular:  Normal rate, regular rhythm, no rubs or gallops.  No murmurs        Intact distal pulses  Pulmonary :  Normal breath sounds, no stridor or respiratory distress   No rales, rhonchi, or wheezing  Abdominal:  Soft, nondistended, bowel sounds present.  No tenderness noted.   Musculoskeletal:  mild lower extremity edema noted.  Lymph Nodes:  No cervical lymphadenopathy noted  Skin:  No cyanosis noted  Neurologic:  Alert, appropriate, moves all 4 extremities without obvious deficit.         Assessment & Plan:

## 2014-09-17 ENCOUNTER — Other Ambulatory Visit: Payer: Self-pay | Admitting: Internal Medicine

## 2014-09-18 ENCOUNTER — Other Ambulatory Visit: Payer: Self-pay

## 2014-09-18 MED ORDER — NITROGLYCERIN 0.4 MG SL SUBL: 0.4000 mg | SUBLINGUAL_TABLET | SUBLINGUAL | Status: DC | PRN

## 2014-09-18 NOTE — Telephone Encounter (Signed)
Rx(s) sent to pharmacy electronically.  

## 2014-10-11 ENCOUNTER — Other Ambulatory Visit: Payer: Self-pay | Admitting: Physician Assistant

## 2014-10-11 ENCOUNTER — Telehealth: Payer: Self-pay | Admitting: Internal Medicine

## 2014-10-11 NOTE — Telephone Encounter (Signed)
Patient has called in today to schedule a 3 month F/U visit with PCP but no appointments are available; Patient is also requesting a lisinopril refill until she can make an appointment; patient is down to her last pill that she is going to take tomorrow; Patient uses [facility]; Thank you

## 2014-10-12 ENCOUNTER — Other Ambulatory Visit: Payer: Self-pay | Admitting: Internal Medicine

## 2014-10-12 NOTE — Telephone Encounter (Signed)
I have never seen this patient, she is [facility] patient. Vicente Males please have someone switch the PCP back to [place]. Thanks

## 2014-10-18 ENCOUNTER — Other Ambulatory Visit: Payer: Self-pay

## 2014-10-18 ENCOUNTER — Other Ambulatory Visit: Payer: Self-pay | Admitting: Internal Medicine

## 2014-10-18 DIAGNOSIS — Z1231 Encounter for screening mammogram for malignant neoplasm of breast: Secondary | ICD-10-CM

## 2014-10-25 ENCOUNTER — Telehealth: Payer: Self-pay | Admitting: *Deleted

## 2014-10-25 ENCOUNTER — Ambulatory Visit
Admission: RE | Admit: 2014-10-25 | Discharge: 2014-10-25 | Disposition: A | Discharge status: Home / Self Care | Payer: Medicaid Other | Source: Ambulatory Visit

## 2014-10-25 DIAGNOSIS — Z1231 Encounter for screening mammogram for malignant neoplasm of breast: Secondary | ICD-10-CM

## 2014-10-25 NOTE — Telephone Encounter (Signed)
Left message for patient to return our call.

## 2014-10-25 NOTE — Telephone Encounter (Signed)
-----   Message from Tresa Garter, MD sent at 10/25/2014  3:34 PM EDT ----- Please inform patient that her screening mammogram shows no evidence of malignancy.

## 2014-10-31 ENCOUNTER — Ambulatory Visit: Payer: Medicaid Other | Attending: Internal Medicine | Admitting: Internal Medicine

## 2014-10-31 ENCOUNTER — Encounter: Payer: Self-pay | Admitting: Internal Medicine

## 2014-10-31 VITALS — BP 150/82 | HR 74 | Temp 98.0°F | Resp 16 | Ht 67.0 in | Wt 270.0 lb

## 2014-10-31 DIAGNOSIS — E785 Hyperlipidemia, unspecified: Secondary | ICD-10-CM

## 2014-10-31 DIAGNOSIS — I10 Essential (primary) hypertension: Secondary | ICD-10-CM

## 2014-10-31 DIAGNOSIS — E1165 Type 2 diabetes mellitus with hyperglycemia: Secondary | ICD-10-CM

## 2014-10-31 DIAGNOSIS — J449 Chronic obstructive pulmonary disease, unspecified: Secondary | ICD-10-CM

## 2014-10-31 DIAGNOSIS — M545 Low back pain: Secondary | ICD-10-CM

## 2014-10-31 LAB — GLUCOSE, POCT (MANUAL RESULT ENTRY): POC GLUCOSE: 213 mg/dL — AB (ref 70–99)

## 2014-10-31 LAB — POCT GLYCOSYLATED HEMOGLOBIN (HGB A1C): Hemoglobin A1C: 7.5

## 2014-10-31 MED ORDER — ACETAMINOPHEN-CODEINE #3 300-30 MG PO TABS: 1.0000 | ORAL_TABLET | ORAL | Status: DC | PRN

## 2014-10-31 MED ORDER — CARVEDILOL 25 MG PO TABS: 12.5000 mg | ORAL_TABLET | Freq: Two times a day (BID) | ORAL | Status: DC

## 2014-10-31 MED ORDER — ALBUTEROL SULFATE HFA 108 (90 BASE) MCG/ACT IN AERS
2.0000 | INHALATION_SPRAY | Freq: Four times a day (QID) | RESPIRATORY_TRACT | Status: DC | PRN
Start: 2014-10-31 — End: 2014-12-10

## 2014-10-31 MED ORDER — SPIRONOLACTONE 25 MG PO TABS: 25.0000 mg | ORAL_TABLET | Freq: Every day | ORAL | Status: DC

## 2014-10-31 MED ORDER — PRAVASTATIN SODIUM 80 MG PO TABS: 80.0000 mg | ORAL_TABLET | Freq: Every evening | ORAL | Status: DC

## 2014-10-31 MED ORDER — LISINOPRIL 10 MG PO TABS: 10.0000 mg | ORAL_TABLET | Freq: Every day | ORAL | Status: DC

## 2014-10-31 NOTE — Progress Notes (Signed)
Patient here to follow up on DM and HTN Needs refills on all medications- she has not had her HTN meds in a week Would like pain medication for back,hips, and feet throbbing pain 10/10 Patient is not fasting Patient uses 2L O2 PRN now.  She used to use it constantly Medicaid does not want to pay for her to use O2 PRN and she would like you to evaluate if she needs it continuously or not.  If not, her O2 will be discontinued

## 2014-10-31 NOTE — Patient Instructions (Signed)
Diabetes and Exercise Exercising regularly is important. It is not just about losing weight. It has many health benefits, such as:  Improving your overall fitness, flexibility, and endurance.  Increasing your bone density.  Helping with weight control.  Decreasing your body fat.  Increasing your muscle strength.  Reducing stress and tension.  Improving your overall health. People with diabetes who exercise gain additional benefits because exercise:  Reduces appetite.  Improves the body's use of blood sugar (glucose).  Helps lower or control blood glucose.  Decreases blood pressure.  Helps control blood lipids (such as cholesterol and triglycerides).  Improves the body's use of the hormone insulin by:  Increasing the body's insulin sensitivity.  Reducing the body's insulin needs.  Decreases the risk for heart disease because exercising:  Lowers cholesterol and triglycerides levels.  Increases the levels of good cholesterol (such as high-density lipoproteins [HDL]) in the body.  Lowers blood glucose levels. YOUR ACTIVITY PLAN  Choose an activity that you enjoy and set realistic goals. Your health care provider or diabetes educator can help you make an activity plan that works for you. Exercise regularly as directed by your health care provider. This includes:  Performing resistance training twice a week such as push-ups, sit-ups, lifting weights, or using resistance bands.  Performing 150 minutes of cardio exercises each week such as walking, running, or playing sports.  Staying active and spending no more than 90 minutes at one time being inactive. Even short bursts of exercise are good for you. Three 10-minute sessions spread throughout the day are just as beneficial as a single 30-minute session. Some exercise ideas include:  Taking the dog for a walk.  Taking the stairs instead of the elevator.  Dancing to your favorite song.  Doing an exercise  video.  Doing your favorite exercise with a friend. RECOMMENDATIONS FOR EXERCISING WITH TYPE 1 OR TYPE 2 DIABETES   Check your blood glucose before exercising. If blood glucose levels are greater than 240 mg/dL, check for urine ketones. Do not exercise if ketones are present.  Avoid injecting insulin into areas of the body that are going to be exercised. For example, avoid injecting insulin into:  The arms when playing tennis.  The legs when jogging.  Keep a record of:  Food intake before and after you exercise.  Expected peak times of insulin action.  Blood glucose levels before and after you exercise.  The type and amount of exercise you have done.  Review your records with your health care provider. Your health care provider will help you to develop guidelines for adjusting food intake and insulin amounts before and after exercising.  If you take insulin or oral hypoglycemic agents, watch for signs and symptoms of hypoglycemia. They include:  Dizziness.  Shaking.  Sweating.  Chills.  Confusion.  Drink plenty of water while you exercise to prevent dehydration or heat stroke. Body water is lost during exercise and must be replaced.  Talk to your health care provider before starting an exercise program to make sure it is safe for you. Remember, almost any type of activity is better than none. Document Released: 08/07/2003 Document Revised: 10/01/2013 Document Reviewed: 10/24/2012 [employer] Patient Information 2015 [employer]. This information is not intended to replace advice given to you by your health care provider. Make sure you discuss any questions you have with your health care provider. Basic Carbohydrate Counting for Diabetes Mellitus Carbohydrate counting is a method for keeping track of the amount of carbohydrates you eat.   Eating carbohydrates naturally increases the level of sugar (glucose) in your blood, so it is important for you to know the amount that is  okay for you to have in every meal. Carbohydrate counting helps keep the level of glucose in your blood within normal limits. The amount of carbohydrates allowed is different for every person. A dietitian can help you calculate the amount that is right for you. Once you know the amount of carbohydrates you can have, you can count the carbohydrates in the foods you want to eat. Carbohydrates are found in the following foods:  Grains, such as breads and cereals.  Dried beans and soy products.  Starchy vegetables, such as potatoes, peas, and corn.  Fruit and fruit juices.  Milk and yogurt.  Sweets and snack foods, such as cake, cookies, candy, chips, soft drinks, and fruit drinks. CARBOHYDRATE COUNTING There are two ways to count the carbohydrates in your food. You can use either of the methods or a combination of both. Reading the "Nutrition Facts" on Packaged Food The "Nutrition Facts" is an area that is included on the labels of almost all packaged food and beverages in the [place]. It includes the serving size of that food or beverage and information about the nutrients in each serving of the food, including the grams (g) of carbohydrate per serving.  Decide the number of servings of this food or beverage that you will be able to eat or drink. Multiply that number of servings by the number of grams of carbohydrate that is listed on the label for that serving. The total will be the amount of carbohydrates you will be having when you eat or drink this food or beverage. Learning Standard Serving Sizes of Food When you eat food that is not packaged or does not include "Nutrition Facts" on the label, you need to measure the servings in order to count the amount of carbohydrates.A serving of most carbohydrate-rich foods contains about 15 g of carbohydrates. The following list includes serving sizes of carbohydrate-rich foods that provide 15 g ofcarbohydrate per serving:   1 slice of bread  (1 oz) or 1 six-inch tortilla.    of a hamburger bun or English muffin.  4-6 crackers.   cup unsweetened dry cereal.    cup hot cereal.   cup rice or pasta.    cup mashed potatoes or  of a large baked potato.  1 cup fresh fruit or one small piece of fruit.    cup canned or frozen fruit or fruit juice.  1 cup milk.   cup plain fat-free yogurt or yogurt sweetened with artificial sweeteners.   cup cooked dried beans or starchy vegetable, such as peas, corn, or potatoes.  Decide the number of standard-size servings that you will eat. Multiply that number of servings by 15 (the grams of carbohydrates in that serving). For example, if you eat 2 cups of strawberries, you will have eaten 2 servings and 30 g of carbohydrates (2 servings x 15 g = 30 g). For foods such as soups and casseroles, in which more than one food is mixed in, you will need to count the carbohydrates in each food that is included. EXAMPLE OF CARBOHYDRATE COUNTING Sample Dinner  3 oz chicken breast.   cup of brown rice.   cup of corn.  1 cup milk.   1 cup strawberries with sugar-free whipped topping.  Carbohydrate Calculation Step 1: Identify the foods that contain carbohydrates:   Rice.   Corn.     Milk.   Strawberries. Step 2:Calculate the number of servings eaten of each:   2 servings of rice.   1 serving of corn.   1 serving of milk.   1 serving of strawberries. Step 3: Multiply each of those number of servings by 15 g:   2 servings of rice x 15 g = 30 g.   1 serving of corn x 15 g = 15 g.   1 serving of milk x 15 g = 15 g.   1 serving of strawberries x 15 g = 15 g. Step 4: Add together all of the amounts to find the total grams of carbohydrates eaten: 30 g + 15 g + 15 g + 15 g = 75 g. Document Released: 05/17/2005 Document Revised: 10/01/2013 Document Reviewed: 04/13/2013 [facility] Patient Information 2015 [place], [place]. This information is not intended to  replace advice given to you by your health care provider. Make sure you discuss any questions you have with your health care provider. DASH Eating Plan DASH stands for "Dietary Approaches to Stop Hypertension." The DASH eating plan is a healthy eating plan that has been shown to reduce high blood pressure (hypertension). Additional health benefits may include reducing the risk of type 2 diabetes mellitus, heart disease, and stroke. The DASH eating plan may also help with weight loss. WHAT DO I NEED TO KNOW ABOUT THE DASH EATING PLAN? For the DASH eating plan, you will follow these general guidelines:  Choose foods with a percent daily value for sodium of less than 5% (as listed on the food label).  Use salt-free seasonings or herbs instead of table salt or sea salt.  Check with your health care provider or pharmacist before using salt substitutes.  Eat lower-sodium products, often labeled as "lower sodium" or "no salt added."  Eat fresh foods.  Eat more vegetables, fruits, and low-fat dairy products.  Choose whole grains. Look for the word "whole" as the first word in the ingredient list.  Choose fish and skinless chicken or [place] more often than red meat. Limit fish, poultry, and meat to 6 oz (170 g) each day.  Limit sweets, desserts, sugars, and sugary drinks.  Choose heart-healthy fats.  Limit cheese to 1 oz (28 g) per day.  Eat more home-cooked food and less restaurant, buffet, and fast food.  Limit fried foods.  Cook foods using methods other than frying.  Limit canned vegetables. If you do use them, rinse them well to decrease the sodium.  When eating at a restaurant, ask that your food be prepared with less salt, or no salt if possible. WHAT FOODS CAN I EAT? Seek help from a dietitian for individual calorie needs. Grains Whole grain or whole wheat bread. Brown rice. Whole grain or whole wheat pasta. Quinoa, bulgur, and whole grain cereals. Low-sodium cereals. Corn or  whole wheat flour tortillas. Whole grain cornbread. Whole grain crackers. Low-sodium crackers. Vegetables Fresh or frozen vegetables (raw, steamed, roasted, or grilled). Low-sodium or reduced-sodium tomato and vegetable juices. Low-sodium or reduced-sodium tomato sauce and paste. Low-sodium or reduced-sodium canned vegetables.  Fruits All fresh, canned (in natural juice), or frozen fruits. Meat and Other Protein Products Ground beef (85% or leaner), grass-fed beef, or beef trimmed of fat. Skinless chicken or [place]. Ground chicken or [place]. Pork trimmed of fat. All fish and seafood. Eggs. Dried beans, peas, or lentils. Unsalted nuts and seeds. Unsalted canned beans. Dairy Low-fat dairy products, such as skim or 1% milk, 2% or reduced-fat cheeses, low-fat  ricotta or cottage cheese, or plain low-fat yogurt. Low-sodium or reduced-sodium cheeses. Fats and Oils Tub margarines without trans fats. Light or reduced-fat mayonnaise and salad dressings (reduced sodium). Avocado. Safflower, olive, or canola oils. Natural peanut or almond butter. Other Unsalted popcorn and pretzels. The items listed above may not be a complete list of recommended foods or beverages. Contact your dietitian for more options. WHAT FOODS ARE NOT RECOMMENDED? Grains White bread. White pasta. White rice. Refined cornbread. Bagels and croissants. Crackers that contain trans fat. Vegetables Creamed or fried vegetables. Vegetables in a cheese sauce. Regular canned vegetables. Regular canned tomato sauce and paste. Regular tomato and vegetable juices. Fruits Dried fruits. Canned fruit in light or heavy syrup. Fruit juice. Meat and Other Protein Products Fatty cuts of meat. Ribs, chicken wings, bacon, sausage, bologna, salami, chitterlings, fatback, hot dogs, bratwurst, and packaged luncheon meats. Salted nuts and seeds. Canned beans with salt. Dairy Whole or 2% milk, cream, half-and-half, and cream cheese. Whole-fat or sweetened  yogurt. Full-fat cheeses or blue cheese. Nondairy creamers and whipped toppings. Processed cheese, cheese spreads, or cheese curds. Condiments Onion and garlic salt, seasoned salt, table salt, and sea salt. Canned and packaged gravies. Worcestershire sauce. Tartar sauce. Barbecue sauce. Teriyaki sauce. Soy sauce, including reduced sodium. Steak sauce. Fish sauce. Oyster sauce. Cocktail sauce. Horseradish. Ketchup and mustard. Meat flavorings and tenderizers. Bouillon cubes. Hot sauce. Tabasco sauce. Marinades. Taco seasonings. Relishes. Fats and Oils Butter, stick margarine, lard, shortening, ghee, and bacon fat. Coconut, palm kernel, or palm oils. Regular salad dressings. Other Pickles and olives. Salted popcorn and pretzels. The items listed above may not be a complete list of foods and beverages to avoid. Contact your dietitian for more information. WHERE CAN I FIND MORE INFORMATION? National Heart, Lung, and Blood Institute: travelstabloid.com Document Released: 05/06/2011 Document Revised: 10/01/2013 Document Reviewed: 03/21/2013 [facility] Patient Information 2015 [place], [place]. This information is not intended to replace advice given to you by your health care provider. Make sure you discuss any questions you have with your health care provider. Hypertension Hypertension, commonly called high blood pressure, is when the force of blood pumping through your arteries is too strong. Your arteries are the blood vessels that carry blood from your heart throughout your body. A blood pressure reading consists of a higher number over a lower number, such as 110/72. The higher number (systolic) is the pressure inside your arteries when your heart pumps. The lower number (diastolic) is the pressure inside your arteries when your heart relaxes. Ideally you want your blood pressure below 120/80. Hypertension forces your heart to work harder to pump blood. Your arteries may  become narrow or stiff. Having hypertension puts you at risk for heart disease, stroke, and other problems.  RISK FACTORS Some risk factors for high blood pressure are controllable. Others are not.  Risk factors you cannot control include:   Race. You may be at higher risk if you are African American.  Age. Risk increases with age.  Gender. Men are at higher risk than women before age 69 years. After age 30, women are at higher risk than men. Risk factors you can control include:  Not getting enough exercise or physical activity.  Being overweight.  Getting too much fat, sugar, calories, or salt in your diet.  Drinking too much alcohol. SIGNS AND SYMPTOMS Hypertension does not usually cause signs or symptoms. Extremely high blood pressure (hypertensive crisis) may cause headache, anxiety, shortness of breath, and nosebleed. DIAGNOSIS  To check if  you have hypertension, your health care provider will measure your blood pressure while you are seated, with your arm held at the level of your heart. It should be measured at least twice using the same arm. Certain conditions can cause a difference in blood pressure between your right and left arms. A blood pressure reading that is higher than normal on one occasion does not mean that you need treatment. If one blood pressure reading is high, ask your health care provider about having it checked again. TREATMENT  Treating high blood pressure includes making lifestyle changes and possibly taking medicine. Living a healthy lifestyle can help lower high blood pressure. You may need to change some of your habits. Lifestyle changes may include:  Following the DASH diet. This diet is high in fruits, vegetables, and whole grains. It is low in salt, red meat, and added sugars.  Getting at least 2 hours of brisk physical activity every week.  Losing weight if necessary.  Not smoking.  Limiting alcoholic beverages.  Learning ways to reduce  stress. If lifestyle changes are not enough to get your blood pressure under control, your health care provider may prescribe medicine. You may need to take more than one. Work closely with your health care provider to understand the risks and benefits. HOME CARE INSTRUCTIONS  Have your blood pressure rechecked as directed by your health care provider.   Take medicines only as directed by your health care provider. Follow the directions carefully. Blood pressure medicines must be taken as prescribed. The medicine does not work as well when you skip doses. Skipping doses also puts you at risk for problems.   Do not smoke.   Monitor your blood pressure at home as directed by your health care provider. SEEK MEDICAL CARE IF:   You think you are having a reaction to medicines taken.  You have recurrent headaches or feel dizzy.  You have swelling in your ankles.  You have trouble with your vision. SEEK IMMEDIATE MEDICAL CARE IF:  You develop a severe headache or confusion.  You have unusual weakness, numbness, or feel faint.  You have severe chest or abdominal pain.  You vomit repeatedly.  You have trouble breathing. MAKE SURE YOU:   Understand these instructions.  Will watch your condition.  Will get help right away if you are not doing well or get worse. Document Released: 05/17/2005 Document Revised: 10/01/2013 Document Reviewed: 03/09/2013 [facility] Patient Information 2015 [place], [place]. This information is not intended to replace advice given to you by your health care provider. Make sure you discuss any questions you have with your health care provider.

## 2014-10-31 NOTE — Progress Notes (Signed)
Patient ID: Tamara Silva, female   DOB: Mar 03, 1961, 54 y.o.   MRN: [id number]   Tamara Silva, is a 54 y.o. female  [id number]  [id number]  DOB - [birth year]  Chief Complaint  Patient presents with  . Hypertension  . Diabetes        Subjective:   Tamara Silva is a 55 y.o. female here today for a follow up visit. Patient has extensive medical history as listed below, here today for routine follow-up. She also needs refill on her medications. She is requesting a referral for pain management for her chronic pain syndrome. She has no new complaint. Her sleep study has been rescheduled to July 28, she follows up regularly with pulmonologist for COPD. She is an ex-smoker quit in 2015 January. She has no dizziness, no blurry vision, no leg weakness or swelling. Patient has No headache, No chest pain, No abdominal pain - No Nausea, No new weakness tingling or numbness, No Cough - SOB.  Problem  Dyslipidemia    ALLERGIES: Allergies  Allergen Reactions  . Metolazone Other (See Comments)    Dizziness and falling  . Plavix [Clopidogrel Bisulfate] Other (See Comments)    High PRU's - non-responder  . Nsaids Other (See Comments)    "I do not take NSAIDs d/t interfering with other meds"    PAST MEDICAL HISTORY: Past Medical History  Diagnosis Date  . Diabetes mellitus without complication   . PE (pulmonary embolism)   . Hypercholesterolemia   . Hypertension   . COPD (chronic obstructive pulmonary disease)   . Asthma   . Coronary artery disease   . CHF (congestive heart failure)   . Mental disorder     BIPOLAR  . Shortness of breath   . GERD (gastroesophageal reflux disease)   . Neuromuscular disorder     DIABETIC NEUROPATHY  . Ischemic cardiomyopathy   . Bipolar disorder   . Post-traumatic stress syndrome   . On home oxygen therapy     "2L most all the time" (02/06/2014)    MEDICATIONS AT HOME: Prior to Admission medications   Medication Sig Start Date End Date  Taking? Authorizing Provider  albuterol (PROVENTIL HFA;VENTOLIN HFA) 108 (90 BASE) MCG/ACT inhaler Inhale 2 puffs into the lungs every 6 (six) hours as needed for wheezing or shortness of breath. 10/31/14  Yes Tresa Garter, MD  aspirin 81 MG tablet Take 1 tablet (81 mg total) by mouth daily. 10/09/13  Yes Rhonda G Barrett, PA-C  carvedilol (COREG) 25 MG tablet Take 0.5 tablets (12.5 mg total) by mouth 2 (two) times daily with a meal. 10/31/14  Yes Tresa Garter, MD  cycloSPORINE (RESTASIS) 0.05 % ophthalmic emulsion Place 2 drops into both eyes 2 (two) times daily as needed (for dry eyes).    Yes Historical Provider, MD  EFFIENT 10 MG TABS tablet TAKE 1 TABLET BY MOUTH EVERY DAY 08/22/14  Yes Jolaine Artist, MD  esomeprazole (NEXIUM) 40 MG capsule Take 40 mg by mouth 2 (two) times daily.   Yes Historical Provider, MD  fluticasone (FLONASE) 50 MCG/ACT nasal spray Place 2 sprays into both nostrils 2 (two) times daily. 09/16/14  Yes Tresa Garter, MD  furosemide (LASIX) 40 MG tablet Take 2 tablets (80 mg total) by mouth 2 (two) times daily. 03/22/14  Yes Tresa Garter, MD  Glucose Blood (ACCU-CHEK ADVANTAGE TEST VI) 1 each by Other route See admin instructions. Check blood sugar 4 times daily.   Yes Historical Provider, MD  insulin aspart (NOVOLOG) 100 UNIT/ML FlexPen Inject 15-20 Units into the skin 3 (three) times daily with meals. 09/16/14  Yes Tresa Garter, MD  insulin detemir (LEVEMIR) 100 UNIT/ML injection Inject 42 Units into the skin at bedtime.   Yes Historical Provider, MD  INSULIN SYRINGE .5CC/29G 29G X 1/2" 0.5 ML MISC Use as instructed by physician 03/28/14  Yes Tresa Garter, MD  ipratropium-albuterol (DUONEB) 0.5-2.5 (3) MG/3ML SOLN Take 3 mLs by nebulization every 6 (six) hours as needed. 09/16/14  Yes Tresa Garter, MD  lisinopril (PRINIVIL,ZESTRIL) 10 MG tablet Take 1 tablet (10 mg total) by mouth daily. 10/31/14  Yes Tresa Garter, MD    nitroGLYCERIN (NITROSTAT) 0.4 MG SL tablet Place 1 tablet (0.4 mg total) under the tongue every 5 (five) minutes as needed for chest pain. NEEDS APPOINTMENT FOR FUTURE REFILLS 09/18/14  Yes Brett Canales, PA-C  omeprazole (PRILOSEC) 40 MG capsule Take 1 capsule (40 mg total) by mouth 2 (two) times daily. 06/10/14  Yes Tresa Garter, MD  Paliperidone Palmitate (INVEGA SUSTENNA) 117 MG/0.75ML SUSP Inject 156 mg into the muscle every 30 (thirty) days. One injection monthly. On the 16th of every month   Yes Historical Provider, MD  pravastatin (PRAVACHOL) 80 MG tablet Take 1 tablet (80 mg total) by mouth every evening. 10/31/14  Yes Tresa Garter, MD  spironolactone (ALDACTONE) 25 MG tablet Take 1 tablet (25 mg total) by mouth daily. 10/31/14  Yes Tresa Garter, MD  acetaminophen-codeine (TYLENOL #3) 300-30 MG per tablet Take 1 tablet by mouth every 4 (four) hours as needed. 10/31/14   Tresa Garter, MD  diazepam (VALIUM) 10 MG tablet Take 0.5 tablets (5 mg total) by mouth every 8 (eight) hours as needed for anxiety. Patient not taking: Reported on 10/31/2014 07/16/13   Allie Bossier, MD  insulin aspart (NOVOLOG) 100 UNIT/ML injection Inject 15-20 Units into the skin 3 (three) times daily before meals. Sliding scale Patient not taking: Reported on 10/31/2014 09/16/14   Tresa Garter, MD     Objective:   Filed Vitals:   10/31/14 1102  BP: 150/82  Pulse: 74  Temp: 98 F (36.7 C)  Resp: 16  Height: 5\' 7"  (1.702 m)  Weight: 270 lb (122.471 kg)  SpO2: 93%    Exam General appearance : Awake, alert, not in any distress. Speech Clear. Not toxic looking HEENT: Atraumatic and Normocephalic, pupils equally reactive to light and accomodation Neck: supple, no JVD. No cervical lymphadenopathy.  Chest:Good air entry bilaterally, no added sounds  CVS: S1 S2 regular, no murmurs.  Abdomen: Bowel sounds present, Non tender and not distended with no gaurding, rigidity or  rebound. Extremities: B/L Lower Ext shows no edema, both legs are warm to touch Neurology: Awake alert, and oriented X 3, CN II-XII intact, Non focal Skin:No Rash  Data Review Lab Results  Component Value Date   HGBA1C 7.50 10/31/2014   HGBA1C 8.9* 03/04/2014   HGBA1C 9.7* 02/07/2014     Assessment & Plan   1. Type 2 diabetes mellitus   - HgB A1c - Glucose (CBG)  Aim for 2-3 Carb Choices per meal (30-45 grams) +/- 1 either way  Aim for 0-15 Carbs per snack if hungry  Include protein in moderation with your meals and snacks  Consider reading food labels for Total Carbohydrate and Fat Grams of foods  Consider checking BG at alternate times per day  Continue taking medication as directed Fruit Punch -  find one with no sugar  Measure and decrease portions of carbohydrate foods  Make your plate and don't go back for seconds   2. COPD mixed type  - albuterol (PROVENTIL HFA;VENTOLIN HFA) 108 (90 BASE) MCG/ACT inhaler; Inhale 2 puffs into the lungs every 6 (six) hours as needed for wheezing or shortness of breath.  Dispense: 3 Inhaler; Refill: 3  3. Essential hypertension  - spironolactone (ALDACTONE) 25 MG tablet; Take 1 tablet (25 mg total) by mouth daily.  Dispense: 90 tablet; Refill: 3 - lisinopril (PRINIVIL,ZESTRIL) 10 MG tablet; Take 1 tablet (10 mg total) by mouth daily.  Dispense: 90 tablet; Refill: 3 - carvedilol (COREG) 25 MG tablet; Take 0.5 tablets (12.5 mg total) by mouth 2 (two) times daily with a meal.  Dispense: 180 tablet; Refill: 3  4. Dyslipidemia  - pravastatin (PRAVACHOL) 80 MG tablet; Take 1 tablet (80 mg total) by mouth every evening.  Dispense: 90 tablet; Refill: 3  5. Low back pain without sciatica, unspecified back pain laterality  - acetaminophen-codeine (TYLENOL #3) 300-30 MG per tablet; Take 1 tablet by mouth every 4 (four) hours as needed.  Dispense: 60 tablet; Refill: 0 - Ambulatory referral to Pain Clinic   Patient have been counseled  extensively about nutrition and exercise Return in about 3 months (around 01/31/2015), or if symptoms worsen or fail to improve, for Hemoglobin A1C and Follow up, DM, Follow up HTN, Annual Physical, Follow up Pain and comorbidities.  The patient was given clear instructions to go to ER or return to medical center if symptoms don't improve, worsen or new problems develop. The patient verbalized understanding. The patient was told to call to get lab results if they haven't heard anything in the next week.   This note has been created with Surveyor, quantity. Any transcriptional errors are unintentional.    Angelica Chessman, MD, [place], [place], [place], [place] and [place], [place]   10/31/2014, 11:44 AM

## 2014-11-14 ENCOUNTER — Other Ambulatory Visit: Payer: Self-pay | Admitting: Internal Medicine

## 2014-11-18 ENCOUNTER — Other Ambulatory Visit: Payer: Self-pay | Admitting: Internal Medicine

## 2014-11-20 ENCOUNTER — Other Ambulatory Visit: Payer: Self-pay | Admitting: Internal Medicine

## 2014-11-25 ENCOUNTER — Encounter (HOSPITAL_BASED_OUTPATIENT_CLINIC_OR_DEPARTMENT_OTHER): Payer: Self-pay

## 2014-11-25 ENCOUNTER — Telehealth: Payer: Self-pay | Admitting: Internal Medicine

## 2014-11-25 NOTE — Telephone Encounter (Signed)
Pt also requesting nebulizer script to be sent to [facility] fax: [phone] as soon as possible, says she has been unable to take last couple of treatments.

## 2014-11-25 NOTE — Telephone Encounter (Signed)
Patient called requesting medcation refill sent to [facility], so medication can be mailed to patient. Please f/u with patient

## 2014-12-10 ENCOUNTER — Other Ambulatory Visit: Payer: Self-pay | Admitting: Internal Medicine

## 2014-12-11 IMAGING — CR DG CHEST 2V
2 series · 2 of 2 positions shown · non-contrast
Comparison: Radiographs 02/06/2014.  CT 02/01/2014.

CLINICAL DATA: Acute shortness of breath for 2 days. History of
hypertension, asthma, diabetes and congestive heart failure. Initial
encounter.

EXAM:
CHEST  2 VIEW

[w chest lat]
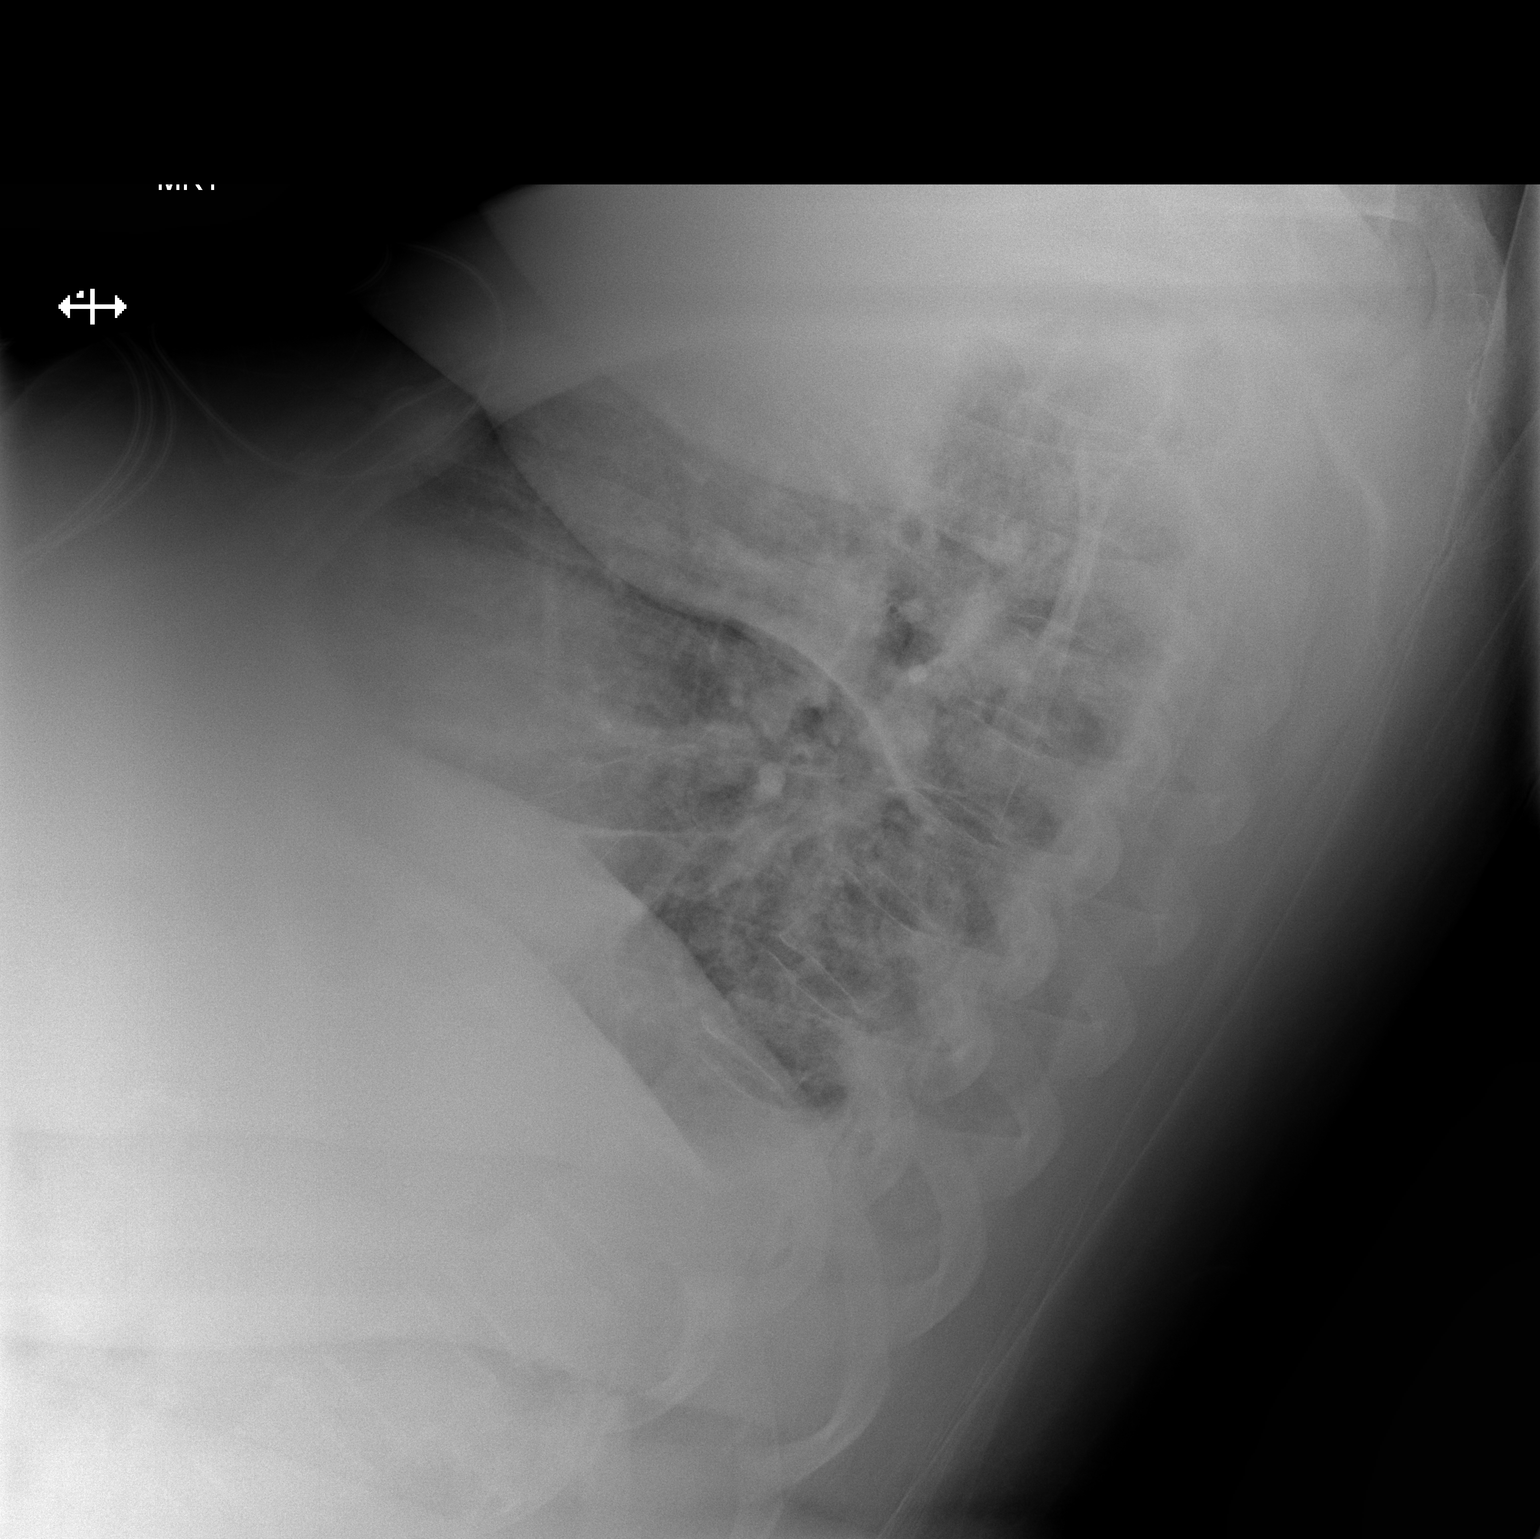

[x chest ap]
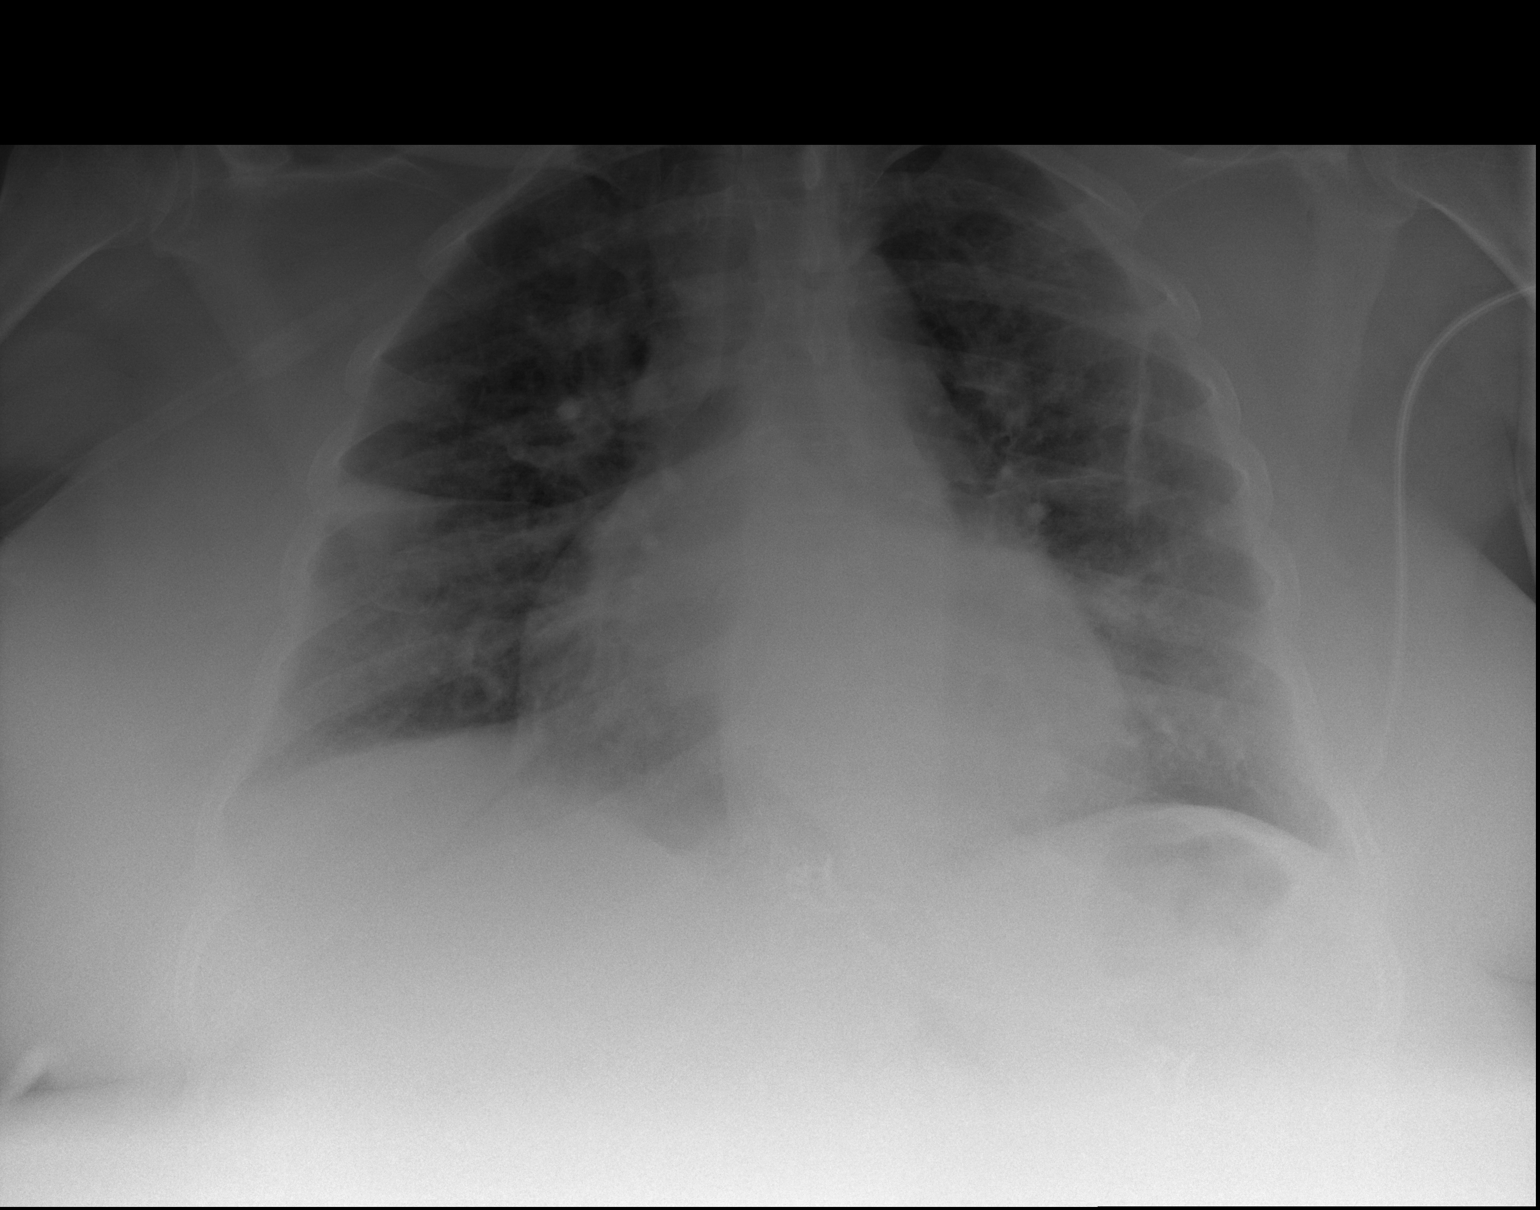

[2 of 2 positions shown; findings below may reference images not displayed]

FINDINGS: The heart size and mediastinal contours are stable. There is stable
chronic lung disease with linear scarring in the left upper lobe.
There is new horizontal density within the right minor fissure which
likely represents a small amount of loculated pleural fluid. No
acute osseous findings are evident.
IMPRESSION: New small amount of fluid within the minor fissure. Otherwise stable
chest with mild chronic lung disease.

## 2014-12-17 ENCOUNTER — Other Ambulatory Visit: Payer: Self-pay | Admitting: Internal Medicine

## 2014-12-18 ENCOUNTER — Other Ambulatory Visit: Payer: Self-pay | Admitting: *Deleted

## 2014-12-18 MED ORDER — NITROGLYCERIN 0.4 MG SL SUBL: 0.4000 mg | SUBLINGUAL_TABLET | SUBLINGUAL | Status: DC | PRN

## 2014-12-18 NOTE — Telephone Encounter (Signed)
Surescript received requesting refill on prazosin (minipress) 5 mg q HS for nightmares Med is not on patient med list. Unable to locate provider note regarding this med  Would you like this med refilled?

## 2014-12-24 ENCOUNTER — Institutional Professional Consult (permissible substitution): Payer: Self-pay | Admitting: Pulmonary Disease

## 2014-12-25 ENCOUNTER — Telehealth: Payer: Self-pay | Admitting: *Deleted

## 2014-12-25 MED ORDER — CARVEDILOL 25 MG PO TABS: 25.0000 mg | ORAL_TABLET | Freq: Two times a day (BID) | ORAL | Status: DC

## 2014-12-25 NOTE — Telephone Encounter (Signed)
Received phone message from [place] at [facility] requesting 30 day supply of carvedilol 25 mg. Rx e-scribed as requested

## 2014-12-26 ENCOUNTER — Ambulatory Visit (HOSPITAL_BASED_OUTPATIENT_CLINIC_OR_DEPARTMENT_OTHER): Payer: Medicaid Other | Attending: Pulmonary Disease | Admitting: Radiology

## 2014-12-26 VITALS — Ht 67.0 in | Wt 274.0 lb

## 2014-12-26 DIAGNOSIS — G4736 Sleep related hypoventilation in conditions classified elsewhere: Secondary | ICD-10-CM | POA: Diagnosis not present

## 2014-12-26 DIAGNOSIS — I493 Ventricular premature depolarization: Secondary | ICD-10-CM | POA: Insufficient documentation

## 2014-12-26 DIAGNOSIS — R0683 Snoring: Secondary | ICD-10-CM | POA: Insufficient documentation

## 2014-12-26 DIAGNOSIS — G4733 Obstructive sleep apnea (adult) (pediatric): Secondary | ICD-10-CM | POA: Diagnosis not present

## 2014-12-26 DIAGNOSIS — G4700 Insomnia, unspecified: Secondary | ICD-10-CM | POA: Diagnosis present

## 2014-12-29 ENCOUNTER — Telehealth: Payer: Self-pay | Admitting: Pulmonary Disease

## 2014-12-29 NOTE — Telephone Encounter (Signed)
PSG 12/26/14 >> AHI 5.7, SaO2 low 87%.  Will have my nurse inform pt that sleep study shows mild sleep apnea.  She needs ROV to discuss tx options >> can be with me or Tammy Parrett.

## 2014-12-31 NOTE — Telephone Encounter (Signed)
Results have been explained to patient, pt expressed understanding. Scheduled for appt with TP to review results 01/20/15 @ 11:30. Nothing further needed.

## 2015-01-07 ENCOUNTER — Ambulatory Visit (INDEPENDENT_AMBULATORY_CARE_PROVIDER_SITE_OTHER)
Admission: RE | Admit: 2015-01-07 | Discharge: 2015-01-07 | Disposition: A | Discharge status: Home / Self Care | Payer: Medicaid Other | Source: Ambulatory Visit | Attending: Internal Medicine | Admitting: Internal Medicine

## 2015-01-07 ENCOUNTER — Ambulatory Visit (INDEPENDENT_AMBULATORY_CARE_PROVIDER_SITE_OTHER): Payer: Medicaid Other | Admitting: Internal Medicine

## 2015-01-07 ENCOUNTER — Encounter: Payer: Self-pay | Admitting: Internal Medicine

## 2015-01-07 ENCOUNTER — Ambulatory Visit (HOSPITAL_BASED_OUTPATIENT_CLINIC_OR_DEPARTMENT_OTHER): Payer: Medicaid Other | Admitting: Pulmonary Disease

## 2015-01-07 VITALS — BP 124/60 | HR 67

## 2015-01-07 DIAGNOSIS — R0600 Dyspnea, unspecified: Secondary | ICD-10-CM

## 2015-01-07 DIAGNOSIS — I10 Essential (primary) hypertension: Secondary | ICD-10-CM | POA: Diagnosis not present

## 2015-01-07 DIAGNOSIS — G4733 Obstructive sleep apnea (adult) (pediatric): Secondary | ICD-10-CM | POA: Diagnosis not present

## 2015-01-07 DIAGNOSIS — J449 Chronic obstructive pulmonary disease, unspecified: Secondary | ICD-10-CM | POA: Diagnosis not present

## 2015-01-07 MED ORDER — VALSARTAN 160 MG PO TABS: 160.0000 mg | ORAL_TABLET | Freq: Every day | ORAL | Status: DC

## 2015-01-07 MED ORDER — BISOPROLOL FUMARATE 5 MG PO TABS: 5.0000 mg | ORAL_TABLET | Freq: Every day | ORAL | Status: DC

## 2015-01-07 NOTE — Progress Notes (Signed)
Subjective:    Patient ID: Tamara Silva, female    DOB: [birth year],   MRN: [id number]  HPI  78 yobf quit smoking in 04/2014  Prev eval by Dr Gwenette Greet just for osa but self referred for sob 01/07/2015 to pulmonary clinic.     History of Present Illness  01/07/2015 1st [facility] office visit for non-sleep issues / [place]   Chief Complaint  Patient presents with  . Pulmonary Consult    Self referral. Pt states she was dxed with COPD and Asthma approx 7 yrs ago. She states that she was doing good "until my machine broke"neb machine stopped working approx 2 months ago and until then she had been using duoneb qid.    extremely easily confused with details of care/ apparently chronically on acei and coreg and duoneb qid and already used the latter twice before 230 pm ov and feels she needs another one at ov  and no better since quit smoking   No obvious  day to day or daytime variabilty or assoc chronic cough or cp or chest tightness, subjective wheeze overt sinus or hb symptoms. No unusual exp hx or h/o childhood pna/ asthma or knowledge of premature birth.  Sleeping ok without nocturnal  or early am exacerbation  of respiratory  c/o's or need for noct saba. Also denies any obvious fluctuation of symptoms with weather or environmental changes or other aggravating or alleviating factors except as outlined above   Current Medications, Allergies, Complete Past Medical History, Past Surgical History, Family History, and Social History were reviewed in [employer] record.              Review of Systems  Constitutional: Negative for fever, chills and unexpected weight change.  HENT: Negative for congestion, dental problem, ear pain, nosebleeds, postnasal drip, rhinorrhea, sinus pressure, sneezing, sore throat, trouble swallowing and voice change.   Eyes: Negative for visual disturbance.  Respiratory: Positive for shortness of breath. Negative for cough and choking.     Cardiovascular: Negative for chest pain and leg swelling.  Gastrointestinal: Negative for vomiting, abdominal pain and diarrhea.  Genitourinary: Negative for difficulty urinating.  Musculoskeletal: Negative for arthralgias.  Skin: Negative for rash.  Neurological: Negative for tremors, syncope and headaches.  Hematological: Does not bruise/bleed easily.       Objective:   Physical Exam  Obese bm ? Slowed mentation / nad  Wt Readings from Last 3 Encounters:  12/26/14 274 lb (124.286 kg)  10/31/14 270 lb (122.471 kg)  09/16/14 284 lb (128.822 kg)    Vital signs reviewed   HEENT: nl dentition, turbinates, and orophanx. Nl external ear canals without cough reflex   NECK :  without JVD/Nodes/TM/ nl carotid upstrokes bilaterally   LUNGS: no acc muscle use, clear to A and P bilaterally without cough on insp or exp maneuvers   CV:  RRR  no s3 or murmur or increase in P2, no edema   ABD:  soft and nontender with nl excursion in the supine position. No bruits or organomegaly, bowel sounds nl  MS:  warm without deformities, calf tenderness, cyanosis or clubbing  SKIN: warm and dry without lesions    NEURO:  alert, approp, no deficits   CXR PA and Lateral:   01/07/2015 :     I personally reviewed images and agree with radiology impression as follows:    Findings similar to the prior chest radiograph. There is vascular congestion without overt pulmonary edema. Mild cardiomegaly. Minimal  left effusion. No evidence of pneumonia.              Assessment & Plan:

## 2015-01-07 NOTE — Patient Instructions (Addendum)
Stop lisinopril and corevidol  Start valsartan  160 mg dialy and bystolic 5 mg daily   Continue nexium 40 mg Take 30-60 min before first meal of the day   GERD (REFLUX)  is an extremely common cause of respiratory symptoms just like yours , many times with no obvious heartburn at all.    It can be treated with medication, but also with lifestyle changes including elevation of the head of your bed (ideally with 6 inch  bed blocks),  Smoking cessation, avoidance of late meals, excessive alcohol, and avoid fatty foods, chocolate, peppermint, colas, red wine, and acidic juices such as orange juice.  NO MINT OR MENTHOL PRODUCTS SO NO COUGH DROPS  USE SUGARLESS CANDY INSTEAD (Jolley ranchers or Stover's or Life Savers) or even ice chips will also do - the key is to swallow to prevent all throat clearing. NO OIL BASED VITAMINS - use powdered substitutes.  Only use your albuterol as a rescue medication to be used if you can't catch your breath by resting or doing a relaxed purse lip breathing pattern.  - The less you use it, the better it will work when you need it. - Ok to use up to 2 puffs  every 4 hours if you must but call for immediate appointment if use goes up over your usual need - Don't leave home without it !!  (think of it like the spare tire for your car)   Only use your duoneb if you try the albuterol inhaler first and it doesn't work.  Please remember to go to the   x-ray department downstairs for your tests - we will call you with the results when they are available.     Keep appt to see Tammy 01/20/15 as planned but bring all your meds /inhalers/ neb solutions with you to make sure we have a complete/ accurate list  Late add:  Needs bmet/ cbc/ tsh/ bnp   on return

## 2015-01-08 ENCOUNTER — Telehealth: Payer: Self-pay | Admitting: Pulmonary Disease

## 2015-01-08 NOTE — Progress Notes (Signed)
Quick Note:  Spoke with pt and notified of results per Dr. Wert. Pt verbalized understanding and denied any questions.  ______ 

## 2015-01-08 NOTE — Telephone Encounter (Signed)
Phone note entered in error 

## 2015-01-10 ENCOUNTER — Telehealth: Payer: Self-pay | Admitting: *Deleted

## 2015-01-10 NOTE — Telephone Encounter (Signed)
Called to start PA and it is after hours. WCB

## 2015-01-10 NOTE — Telephone Encounter (Signed)
Yes that's fine but be sure if she's going to go s it while waiting for rx she takes the bisoprolol twice daily instead of once but under no circumstances should she back on ACEi (lisinopril)

## 2015-01-10 NOTE — Telephone Encounter (Signed)
Received PA for valsartan 160 mg [phone] ID# [id number] P No alternative provided on PA. MW do you want [place] to do PA? thanks

## 2015-01-13 NOTE — Progress Notes (Signed)
Patient Name: Tamara Silva, Tamara Silva Date: 12/26/2014 Gender: Female D.O.B: Jan 29, 1961 Age (years): 75 Referring Provider: Chesley Mires MD, ABSM Height (inches): 61 Interpreting Physician: Chesley Mires MD, ABSM Weight (lbs): 274 RPSGT: Laren Everts BMI: 43 MRN: [id number] Neck Size: 16.00 CLINICAL INFORMATION Sleep Study Type: NPSG Indication for sleep study: Depression, Hypertension, Insomnia, Obesity, Snoring Epworth Sleepiness Score: 2 SLEEP STUDY TECHNIQUE As per the AASM Manual for the Scoring of Sleep and Associated Events v2.3 (April 2016) with a hypopnea requiring 4% desaturations. The channels recorded and monitored were frontal, central and occipital EEG, electrooculogram (EOG), submentalis EMG (chin), nasal and oral airflow, thoracic and abdominal wall motion, anterior tibialis EMG, snore microphone, electrocardiogram, and pulse oximetry. MEDICATIONS Patient's medications include: reviewed in electronic medical record. Medications self-administered by patient during sleep study : Sleep medicine administered - Ambien 5 mg at 07:41:08 PM SLEEP ARCHITECTURE The study was initiated at 10:09:59 PM and ended at 5:31:37 AM. Sleep onset time was 18.6 minutes and the sleep efficiency was 88.5%. The total sleep time was 391.0 minutes. Stage REM latency was 73.0 minutes. The patient spent 5.50% of the night in stage N1 sleep, 80.18% in stage N2 sleep, 0.00% in stage N3 and 14.32% in REM. Alpha intrusion was absent. Supine sleep was 14.19%. RESPIRATORY PARAMETERS The overall apnea/hypopnea index (AHI) was 5.7 per hour. There were 7 total apneas, including 7 obstructive, 0 central and 0 mixed apneas. There were 30 hypopneas and 5 RERAs. The AHI during Stage REM sleep was 35.4 per hour. AHI while supine was 2.2 per hour. The mean oxygen saturation was 95.39%. The minimum SpO2 during sleep was 82.00%. Moderate snoring was noted during this study. CARDIAC DATA The 2 lead EKG  demonstrated sinus rhythm. The mean heart rate was 73.46 beats per minute. Other EKG findings include: PVCs. LEG MOVEMENT DATA The total PLMS were 4 with a resulting PLMS index of 0.61. Associated arousal with leg movement index was 0.0 . IMPRESSIONS Mild obstructive sleep apnea occurred during this study (AHI = 5.7/h). No significant central sleep apnea occurred during this study (CAI = 0.0/h). Mild oxygen desaturation was noted during this study (Min O2 = 82.00%). The patient snored with Moderate snoring volume. EKG findings include PVCs. Clinically significant periodic limb movements did not occur during sleep. No significant associated arousals. DIAGNOSIS Obstructive Sleep Apnea (327.23 [G47.33 ICD-10]) Nocturnal Hypoxemia (327.26 [G47.36 ICD-10]) RECOMMENDATIONS Additional therapies include CPAP, oral appliance, or surgical assessment. Avoid alcohol, sedatives and other CNS depressants that may worsen sleep apnea and disrupt normal sleep architecture. Sleep hygiene should be reviewed to assess factors that may improve sleep quality. Weight management and regular exercise should be initiated or continued if appropriate.  [Electronically signed] 01/08/2015 10:41 AM Chesley Mires MD, ABSM Diplomate, American Board of Sleep Medicine NPI: [id number]

## 2015-01-15 ENCOUNTER — Other Ambulatory Visit: Payer: Self-pay | Admitting: Internal Medicine

## 2015-01-15 MED ORDER — INSULIN DETEMIR 100 UNIT/ML ~~LOC~~ SOLN: 42.0000 [IU] | Freq: Every day | SUBCUTANEOUS | Status: DC

## 2015-01-16 NOTE — Telephone Encounter (Signed)
PA started through cover my meds [id number] Your information has been submitted to [facility]. [place] [employer] has not responded in 3-5 business days or if you have any questions about your PA submission, contact [place] [employer] at [phone].   Will forward to leslie to f/u on

## 2015-01-19 ENCOUNTER — Encounter: Payer: Self-pay | Admitting: Internal Medicine

## 2015-01-19 NOTE — Assessment & Plan Note (Addendum)
Quit smoking 04/2014 - spirometry 01/07/2015  FEV1 0.81 (34%)  ratio 66   DDX of  difficult airways management all start with A and  include Adherence, Ace Inhibitors, Acid Reflux, Active Sinus Disease, Alpha 1 Antitripsin deficiency, Anxiety masquerading as Airways dz,  ABPA,  allergy(esp in young), Aspiration (esp in elderly), Adverse effects of meds,  Active smokers, A bunch of PE's (a small clot burden can't cause this syndrome unless there is already severe underlying pulm or vascular dz with poor reserve) plus two Bs  = Bronchiectasis and Beta blocker use..and one C= CHF   Adherence is always the initial "prime suspect" and is a multilayered concern that requires a "trust but verify" approach in every patient - starting with knowing how to use medications, especially inhalers, correctly, keeping up with refills and understanding the fundamental difference between maintenance and prns vs those medications only taken for a very short course and then stopped and not refilled.  -  The proper method of use, as well as anticipated side effects, of a metered-dose inhaler are discussed and demonstrated to the patient. Improved effectiveness after extensive coaching during this visit to a level of approximately  75%  ACEi at top of the usual list of suspects > try off  ? Acid (or non-acid) GERD > always difficult to exclude as up to 75% of pts in some series report no assoc GI/ Heartburn symptoms> rec continue max (24h)  acid suppression and diet restrictions/ reviewed     ? BB effects also a concern with such a low fev1 and reported saba over use   ? chf > needs bnp on return   I had an extended discussion with the patient reviewing all relevant studies completed to date and  lasting 27 m  Each maintenance medication was reviewed in detail including most importantly the difference between maintenance and prns and under what circumstances the prns are to be triggered using an action plan format that is  not reflected in the computer generated alphabetically organized AVS.    Please see instructions for details which were reviewed in writing and the patient given a copy highlighting the part that I personally wrote and discussed at today's ov.

## 2015-01-19 NOTE — Assessment & Plan Note (Signed)
ACE inhibitors are problematic in  pts with airway complaints because  even experienced pulmonologists can't always distinguish ace effects from copd/asthma.  By themselves they don't actually cause a problem, much like oxygen can't by itself start a fire, but they certainly serve as a powerful catalyst or enhancer for any "fire"  or inflammatory process in the upper airway, be it caused by an ET  tube or more commonly reflux (especially in the obese or pts with known GERD or who are on biphoshonates).    In the era of ARB near equivalency until we have a better handle on the reversibility of the airway problem, it just makes sense to avoid ACEI  entirely in the short run and then decide later, having established a level of airway control using a reasonable limited regimen, whether to add back ace but even then being very careful to observe the pt for worsening airway control and number of meds used/ needed to control symptoms.    For now try diovan 160 mg daily    Also concerned re BB use here > Strongly prefer in this setting: Bystolic, the most beta -1  selective Beta blocker available in sample form, with bisoprolol the most selective generic choice  on the market.  Try bisoprolol 5 mg daily

## 2015-01-20 ENCOUNTER — Ambulatory Visit: Payer: Medicaid Other | Admitting: Adult Health

## 2015-01-21 ENCOUNTER — Other Ambulatory Visit: Payer: Self-pay | Admitting: *Deleted

## 2015-01-23 NOTE — Telephone Encounter (Signed)
PA obtained thru [place].  Approved thru 01/18/2016 for name brand only. Ref # [id number].  Pharmacy informed.  Nothing further needed.

## 2015-02-17 ENCOUNTER — Other Ambulatory Visit: Payer: Self-pay | Admitting: *Deleted

## 2015-02-17 MED ORDER — NITROGLYCERIN 0.4 MG SL SUBL: 0.4000 mg | SUBLINGUAL_TABLET | SUBLINGUAL | Status: DC | PRN

## 2015-02-18 ENCOUNTER — Other Ambulatory Visit: Payer: Self-pay | Admitting: Internal Medicine

## 2015-02-21 ENCOUNTER — Emergency Department (HOSPITAL_COMMUNITY)
Admission: EM | Admit: 2015-02-21 | Discharge: 2015-02-21 | Disposition: A | Discharge status: Home / Self Care | Payer: Medicaid Other | Attending: Emergency Medicine | Admitting: Emergency Medicine

## 2015-02-21 ENCOUNTER — Encounter (HOSPITAL_COMMUNITY): Payer: Self-pay | Admitting: Emergency Medicine

## 2015-02-21 DIAGNOSIS — Z79899 Other long term (current) drug therapy: Secondary | ICD-10-CM | POA: Insufficient documentation

## 2015-02-21 DIAGNOSIS — Z9981 Dependence on supplemental oxygen: Secondary | ICD-10-CM | POA: Insufficient documentation

## 2015-02-21 DIAGNOSIS — I509 Heart failure, unspecified: Secondary | ICD-10-CM | POA: Diagnosis not present

## 2015-02-21 DIAGNOSIS — Z87891 Personal history of nicotine dependence: Secondary | ICD-10-CM | POA: Insufficient documentation

## 2015-02-21 DIAGNOSIS — E1140 Type 2 diabetes mellitus with diabetic neuropathy, unspecified: Secondary | ICD-10-CM | POA: Diagnosis not present

## 2015-02-21 DIAGNOSIS — Z9861 Coronary angioplasty status: Secondary | ICD-10-CM | POA: Insufficient documentation

## 2015-02-21 DIAGNOSIS — E780 Pure hypercholesterolemia: Secondary | ICD-10-CM | POA: Diagnosis not present

## 2015-02-21 DIAGNOSIS — I2510 Atherosclerotic heart disease of native coronary artery without angina pectoris: Secondary | ICD-10-CM | POA: Diagnosis not present

## 2015-02-21 DIAGNOSIS — Z794 Long term (current) use of insulin: Secondary | ICD-10-CM | POA: Diagnosis not present

## 2015-02-21 DIAGNOSIS — F319 Bipolar disorder, unspecified: Secondary | ICD-10-CM | POA: Diagnosis not present

## 2015-02-21 DIAGNOSIS — Z7982 Long term (current) use of aspirin: Secondary | ICD-10-CM | POA: Insufficient documentation

## 2015-02-21 DIAGNOSIS — J449 Chronic obstructive pulmonary disease, unspecified: Secondary | ICD-10-CM | POA: Insufficient documentation

## 2015-02-21 DIAGNOSIS — R1010 Upper abdominal pain, unspecified: Secondary | ICD-10-CM | POA: Diagnosis not present

## 2015-02-21 DIAGNOSIS — R112 Nausea with vomiting, unspecified: Secondary | ICD-10-CM | POA: Diagnosis not present

## 2015-02-21 DIAGNOSIS — K219 Gastro-esophageal reflux disease without esophagitis: Secondary | ICD-10-CM | POA: Insufficient documentation

## 2015-02-21 DIAGNOSIS — F4310 Post-traumatic stress disorder, unspecified: Secondary | ICD-10-CM | POA: Insufficient documentation

## 2015-02-21 DIAGNOSIS — Z86711 Personal history of pulmonary embolism: Secondary | ICD-10-CM | POA: Insufficient documentation

## 2015-02-21 DIAGNOSIS — I10 Essential (primary) hypertension: Secondary | ICD-10-CM | POA: Diagnosis not present

## 2015-02-21 DIAGNOSIS — I252 Old myocardial infarction: Secondary | ICD-10-CM | POA: Insufficient documentation

## 2015-02-21 LAB — URINE MICROSCOPIC-ADD ON

## 2015-02-21 LAB — CBC
HCT: 39.7 % (ref 36.0–46.0)
Hemoglobin: 12.3 g/dL (ref 12.0–15.0)
MCH: 23.2 pg — ABNORMAL LOW (ref 26.0–34.0)
MCHC: 31 g/dL (ref 30.0–36.0)
MCV: 74.8 fL — ABNORMAL LOW (ref 78.0–100.0)
Platelets: 287 10*3/uL (ref 150–400)
RBC: 5.31 MIL/uL — ABNORMAL HIGH (ref 3.87–5.11)
RDW: 18.2 % — ABNORMAL HIGH (ref 11.5–15.5)
WBC: 9.4 10*3/uL (ref 4.0–10.5)

## 2015-02-21 LAB — COMPREHENSIVE METABOLIC PANEL
ALK PHOS: 60 U/L (ref 38–126)
ALT: 10 U/L — ABNORMAL LOW (ref 14–54)
AST: 16 U/L (ref 15–41)
Albumin: 3.8 g/dL (ref 3.5–5.0)
Anion gap: 9 (ref 5–15)
BUN: 9 mg/dL (ref 6–20)
CALCIUM: 8.9 mg/dL (ref 8.9–10.3)
CO2: 25 mmol/L (ref 22–32)
Chloride: 100 mmol/L — ABNORMAL LOW (ref 101–111)
Creatinine, Ser: 0.65 mg/dL (ref 0.44–1.00)
GFR calc non Af Amer: >60 mL/min (ref >60)
Glucose, Bld: 138 mg/dL — ABNORMAL HIGH (ref 65–99)
Potassium: 4.2 mmol/L (ref 3.5–5.1)
SODIUM: 134 mmol/L — AB (ref 135–145)
Total Bilirubin: 0.4 mg/dL (ref 0.3–1.2)
Total Protein: 8.5 g/dL — ABNORMAL HIGH (ref 6.5–8.1)

## 2015-02-21 LAB — URINALYSIS, ROUTINE W REFLEX MICROSCOPIC
Glucose, UA: NEGATIVE mg/dL
Ketones, ur: 40 mg/dL — AB
Leukocytes, UA: NEGATIVE
NITRITE: NEGATIVE
Protein, ur: 100 mg/dL — AB
Specific Gravity, Urine: 1.026 (ref 1.005–1.030)
UROBILINOGEN UA: 2 mg/dL — AB (ref 0.0–1.0)
pH: 6.5 (ref 5.0–8.0)

## 2015-02-21 LAB — LIPASE, BLOOD: Lipase: 17 U/L — ABNORMAL LOW (ref 22–51)

## 2015-02-21 MED ORDER — PROMETHAZINE HCL 25 MG PO TABS
25.0000 mg | ORAL_TABLET | Freq: Once | ORAL | Status: AC
  Administered 2015-02-21: 25 mg via ORAL
  Filled 2015-02-21: qty 1

## 2015-02-21 MED ORDER — ONDANSETRON 8 MG PO TBDP
8.0000 mg | ORAL_TABLET | Freq: Once | ORAL | Status: AC
  Administered 2015-02-21: 8 mg via ORAL
  Filled 2015-02-21: qty 1

## 2015-02-21 MED ORDER — ACETAMINOPHEN 325 MG PO TABS: 650.0000 mg | ORAL_TABLET | Freq: Once | ORAL | Status: DC

## 2015-02-21 MED ORDER — PROMETHAZINE HCL 25 MG PO TABS: 25.0000 mg | ORAL_TABLET | Freq: Four times a day (QID) | ORAL | Status: DC | PRN

## 2015-02-21 MED ORDER — ONDANSETRON 8 MG PO TBDP: 8.0000 mg | ORAL_TABLET | Freq: Three times a day (TID) | ORAL | Status: DC | PRN

## 2015-02-21 NOTE — ED Notes (Signed)
Nurse currently starting IV 

## 2015-02-21 NOTE — ED Notes (Signed)
Bed: WA01 Expected date:  Expected time:  Means of arrival:  Comments: EMS 

## 2015-02-21 NOTE — Discharge Instructions (Signed)

## 2015-02-21 NOTE — ED Notes (Signed)
Per EMS pt with Hx of MI, PE, CHF, COPD c/o epigastric pain and nausea, O2 saturation 90% on room air, EMS applied 2 L/min [place].

## 2015-02-22 NOTE — ED Provider Notes (Signed)
CSN: [id number]     Arrival date & time 02/21/15  1804 History   First MD Initiated Contact with Patient 02/21/15 1831     Chief Complaint  Patient presents with  . Abdominal Pain  . Nausea      HPI Patient ports nausea and vomiting without diarrhea.  She denies hematemesis.  She told nursing staff per nursing note about upper abdominal pain.  She denies all abdominal pain for me at this time.  She denies flank pain.  No dysuria or urinary frequency.  No fevers or chills.  No recent sick contacts.  She reports decreased oral intake today mildly secondary to nausea and vomiting.  Symptoms are mild in severity   Past Medical History  Diagnosis Date  . Diabetes mellitus without complication   . PE (pulmonary embolism)   . Hypercholesterolemia   . Hypertension   . COPD (chronic obstructive pulmonary disease)   . Asthma   . Coronary artery disease   . CHF (congestive heart failure)   . Mental disorder     BIPOLAR  . Shortness of breath   . GERD (gastroesophageal reflux disease)   . Neuromuscular disorder     DIABETIC NEUROPATHY  . Ischemic cardiomyopathy   . Bipolar disorder   . Post-traumatic stress syndrome   . On home oxygen therapy     "2L most all the time" (02/06/2014)  . MI (myocardial infarction)   . PTSD (post-traumatic stress disorder)   . Depression   . Heart failure   . Bipolar disorder    Past Surgical History  Procedure Laterality Date  . Coronary angioplasty with stent placement      CAD in 2006 x 2 and 2009 2 more- place din REx in [place] and [place] med  . Esophagogastroduodenoscopy (egd) with propofol N/A 08/03/2013    Procedure: ESOPHAGOGASTRODUODENOSCOPY (EGD) WITH PROPOFOL;  Surgeon: Jeryl Columbia, MD;  Location: [facility];  Service: Endoscopy;  Laterality: N/A;  . Colonoscopy with propofol N/A 08/03/2013    Procedure: COLONOSCOPY WITH PROPOFOL;  Surgeon: Jeryl Columbia, MD;  Location: [facility];  Service: Endoscopy;  Laterality: N/A;  . Coronary  angioplasty with stent placement  10/07/2013    Xience Alpine DES 2.75  mm x 15  mm  . Left heart catheterization with coronary angiogram N/A 10/07/2013    Procedure: LEFT HEART CATHETERIZATION WITH CORONARY ANGIOGRAM;  Surgeon: Leonie Man, MD;  [facility] CATH LAB;  Service: Cardiovascular;  Laterality: N/A;   Family History  Problem Relation Age of Onset  . Stroke Mother   . Colon cancer Father   . Breast cancer Paternal Aunt   . Emphysema Brother   . Asthma Paternal Aunt    Social History  Substance Use Topics  . Smoking status: Former Smoker -- 0.50 packs/day for 20 years    Types: Cigarettes    Quit date: 04/30/2014  . Smokeless tobacco: Never Used     Comment: smoked off and on x 20 years  . Alcohol Use: No   OB History    No data available     Review of Systems  All other systems reviewed and are negative.     Allergies  Metolazone; Plavix; and Nsaids  Home Medications   Prior to Admission medications   Medication Sig Start Date End Date Taking? Authorizing Provider  ACCU-CHEK AVIVA PLUS test strip USE 1 STRIP TO CHECK GLUCOSE 3 TIMES DAILY 02/18/15  Yes Tresa Garter, MD  ACCU-CHEK SOFTCLIX LANCETS  lancets USE 1 LANCET TO CHECK GLUCOSE 3 TIMES DAILY 02/18/15  Yes Tresa Garter, MD  aspirin 81 MG tablet Take 1 tablet (81 mg total) by mouth daily. 10/09/13  Yes Rhonda G Barrett, PA-C  cycloSPORINE (RESTASIS) 0.05 % ophthalmic emulsion Place 2 drops into both eyes 2 (two) times daily as needed (for dry eyes).    Yes Historical Provider, MD  EASY TOUCH PEN NEEDLES 31G X 8 MM MISC USE AS DIRECTED TO INJECT INSULIN 02/18/15  Yes Olugbemiga E Doreene Burke, MD  EFFIENT 10 MG TABS tablet TAKE 1 TABLET BY MOUTH EVERY DAY 12/18/14  Yes Shaune Pascal Bensimhon, MD  fluticasone (FLONASE) 50 MCG/ACT nasal spray USE 2 SPRAYS IN EACH NOSTRIL TWICE DAILY Patient taking differently: USE 2 SPRAYS IN EACH NOSTRIL TWICE DAILY AS NEEDED FOR ALLERGIES 12/11/14  Yes Olugbemiga E  Doreene Burke, MD  furosemide (LASIX) 40 MG tablet TAKE 2 TABLETS BY MOUTH IN THE MORNING AND THEN TAKE 1 TABLET IN THE EVENING Patient taking differently: TAKE 40 MG BY MOUTH DAILY AS NEEDED FOR FLUID/SWELLING 12/11/14  Yes Olugbemiga E Doreene Burke, MD  Glucose Blood (ACCU-CHEK ADVANTAGE TEST VI) 1 each by Other route See admin instructions. Check blood sugar 4 times daily.   Yes Historical Provider, MD  insulin aspart (NOVOLOG) 100 UNIT/ML injection Inject 15-20 Units into the skin 3 (three) times daily before meals. Sliding scale 09/16/14  Yes Olugbemiga E Doreene Burke, MD  insulin detemir (LEVEMIR) 100 UNIT/ML injection Inject 0.42 mLs (42 Units total) into the skin at bedtime. 01/15/15  Yes Tresa Garter, MD  INSULIN SYRINGE .5CC/29G 29G X 1/2" 0.5 ML MISC Use as instructed by physician 03/28/14  Yes Tresa Garter, MD  ipratropium-albuterol (DUONEB) 0.5-2.5 (3) MG/3ML SOLN Take 3 mLs by nebulization every 6 (six) hours as needed. Patient taking differently: Take 3 mLs by nebulization every 6 (six) hours as needed (wheezing and shortness of breath).  09/16/14  Yes Tresa Garter, MD  nitroGLYCERIN (NITROSTAT) 0.4 MG SL tablet Place 1 tablet (0.4 mg total) under the tongue every 5 (five) minutes as needed for chest pain. NEED OV. 02/17/15  Yes Brett Canales, PA-C  omeprazole (PRILOSEC) 40 MG capsule TAKE 1 CAPSULE BY MOUTH TWICE DAILY 12/11/14  Yes Tresa Garter, MD  Paliperidone Palmitate (INVEGA SUSTENNA) 117 MG/0.75ML SUSP Inject 156 mg into the muscle every 3 (three) months.    Yes Historical Provider, MD  pravastatin (PRAVACHOL) 80 MG tablet Take 1 tablet (80 mg total) by mouth every evening. 10/31/14  Yes Tresa Garter, MD  prazosin (MINIPRESS) 5 MG capsule TAKE 1 CAPSULE BY MOUTH EVERY NIGHT AT BEDTIME FOR NIGHTMARES 12/19/14  Yes Olugbemiga E Doreene Burke, MD  PROAIR HFA 108 (90 BASE) MCG/ACT inhaler INHALE 2 PUFFS BY MOUTH 4 TIMES DAILY AS NEEDED Patient taking differently: INHALE 2 PUFFS BY  MOUTH 4 TIMES DAILY AS NEEDED FOR WHEEZING AND SHORTNESS OF BREATH 12/11/14  Yes Tresa Garter, MD  spironolactone (ALDACTONE) 25 MG tablet Take 1 tablet (25 mg total) by mouth daily. 10/31/14  Yes Tresa Garter, MD  acetaminophen-codeine (TYLENOL #3) 300-30 MG per tablet Take 1 tablet by mouth every 4 (four) hours as needed. Patient not taking: Reported on 02/21/2015 10/31/14   Tresa Garter, MD  bisoprolol (ZEBETA) 5 MG tablet Take 1 tablet (5 mg total) by mouth daily. Patient not taking: Reported on 02/21/2015 01/07/15   Tanda Rockers, MD  diazepam (VALIUM) 10 MG tablet Take 0.5 tablets (5  mg total) by mouth every 8 (eight) hours as needed for anxiety. Patient not taking: Reported on 02/21/2015 07/16/13   Allie Bossier, MD  insulin aspart (NOVOLOG) 100 UNIT/ML FlexPen Inject 15-20 Units into the skin 3 (three) times daily with meals. Patient not taking: Reported on 02/21/2015 09/16/14   Tresa Garter, MD  ondansetron (ZOFRAN ODT) 8 MG disintegrating tablet Take 1 tablet (8 mg total) by mouth every 8 (eight) hours as needed for nausea or vomiting. 02/21/15   Jola Schmidt, MD  promethazine (PHENERGAN) 25 MG tablet Take 1 tablet (25 mg total) by mouth every 6 (six) hours as needed for nausea or vomiting. 02/21/15   Jola Schmidt, MD  valsartan (DIOVAN) 160 MG tablet Take 1 tablet (160 mg total) by mouth daily. Patient not taking: Reported on 02/21/2015 01/07/15   Tanda Rockers, MD   BP 152/86 mmHg  Pulse 82  Temp(Src) 98.2 F (36.8 C) (Oral)  Resp 18  SpO2 89%  LMP 05/30/2013 Physical Exam  Constitutional: She is oriented to person, place, and time. She appears well-developed and well-nourished. No distress.  HENT:  Head: Normocephalic and atraumatic.  Eyes: EOM are normal.  Neck: Normal range of motion.  Cardiovascular: Normal rate, regular rhythm and normal heart sounds.   Pulmonary/Chest: Effort normal and breath sounds normal.  Abdominal: Soft. She exhibits no distension.  There is no tenderness.  Musculoskeletal: Normal range of motion.  Neurological: She is alert and oriented to person, place, and time.  Skin: Skin is warm and dry.  Psychiatric: She has a normal mood and affect. Judgment normal.  Nursing note and vitals reviewed.   ED Course  Procedures (including critical care time) Labs Review Labs Reviewed  LIPASE, BLOOD - Abnormal; Notable for the following:    Lipase 17 (*)    All other components within normal limits  COMPREHENSIVE METABOLIC PANEL - Abnormal; Notable for the following:    Sodium 134 (*)    Chloride 100 (*)    Glucose, Bld 138 (*)    Total Protein 8.5 (*)    ALT 10 (*)    All other components within normal limits  CBC - Abnormal; Notable for the following:    RBC 5.31 (*)    MCV 74.8 (*)    MCH 23.2 (*)    RDW 18.2 (*)    All other components within normal limits  URINALYSIS, ROUTINE W REFLEX MICROSCOPIC (NOT AT [facility]) - Abnormal; Notable for the following:    Hgb urine dipstick TRACE (*)    Bilirubin Urine SMALL (*)    Ketones, ur 40 (*)    Protein, ur 100 (*)    Urobilinogen, UA 2.0 (*)    All other components within normal limits  URINE MICROSCOPIC-ADD ON - Abnormal; Notable for the following:    Squamous Epithelial / LPF FEW (*)    All other components within normal limits    Imaging Review No results found. I have personally reviewed and evaluated these images and lab results as part of my medical decision-making.   EKG Interpretation None      MDM   Final diagnoses:  Non-intractable vomiting with nausea, vomiting of unspecified type    Patient feels much better after both Zofran and Phenergan while in the emergency department.  Her labs are without significant abnormality.  Primary care follow-up.  She understands return to the ER for new or worsening symptoms.  She's keeping down fluids at this time orally.  Have encouraged  ongoing oral hydration at home.  Repeat abdominal exam prior to discharge  demonstrates no tenderness.     Jola Schmidt, MD 02/22/15 [phone]

## 2015-03-14 ENCOUNTER — Other Ambulatory Visit: Payer: Self-pay | Admitting: *Deleted

## 2015-03-14 MED ORDER — NITROGLYCERIN 0.4 MG SL SUBL: 0.4000 mg | SUBLINGUAL_TABLET | SUBLINGUAL | Status: DC | PRN

## 2015-03-14 NOTE — Telephone Encounter (Signed)
NTG refilled electronically with note MUST MAKE APPT FOR FUTURE REFILLS.

## 2015-03-17 ENCOUNTER — Other Ambulatory Visit: Payer: Self-pay | Admitting: Internal Medicine

## 2015-04-17 ENCOUNTER — Other Ambulatory Visit: Payer: Self-pay | Admitting: Internal Medicine

## 2015-04-17 ENCOUNTER — Other Ambulatory Visit (HOSPITAL_COMMUNITY): Payer: Self-pay | Admitting: Internal Medicine

## 2015-04-17 NOTE — Telephone Encounter (Signed)
Forwarded to PCP for review

## 2015-04-18 ENCOUNTER — Other Ambulatory Visit: Payer: Self-pay | Admitting: *Deleted

## 2015-05-20 ENCOUNTER — Emergency Department (HOSPITAL_COMMUNITY): Payer: Medicaid Other

## 2015-05-20 ENCOUNTER — Encounter (HOSPITAL_COMMUNITY): Payer: Self-pay | Admitting: Emergency Medicine

## 2015-05-20 ENCOUNTER — Other Ambulatory Visit: Payer: Self-pay | Admitting: Pharmacist Clinician (PhC)/ Clinical Pharmacy Specialist

## 2015-05-20 ENCOUNTER — Inpatient Hospital Stay (HOSPITAL_COMMUNITY)
Admission: EM | Admit: 2015-05-20 | Discharge: 2015-05-26 | DRG: 190 | Disposition: A | Discharge status: Home / Self Care | Payer: Medicaid Other | Attending: Internal Medicine | Admitting: Internal Medicine

## 2015-05-20 DIAGNOSIS — I2510 Atherosclerotic heart disease of native coronary artery without angina pectoris: Secondary | ICD-10-CM | POA: Diagnosis present

## 2015-05-20 DIAGNOSIS — Z7901 Long term (current) use of anticoagulants: Secondary | ICD-10-CM

## 2015-05-20 DIAGNOSIS — E785 Hyperlipidemia, unspecified: Secondary | ICD-10-CM | POA: Diagnosis present

## 2015-05-20 DIAGNOSIS — Z8249 Family history of ischemic heart disease and other diseases of the circulatory system: Secondary | ICD-10-CM | POA: Diagnosis not present

## 2015-05-20 DIAGNOSIS — E1121 Type 2 diabetes mellitus with diabetic nephropathy: Secondary | ICD-10-CM | POA: Diagnosis present

## 2015-05-20 DIAGNOSIS — F4310 Post-traumatic stress disorder, unspecified: Secondary | ICD-10-CM | POA: Diagnosis present

## 2015-05-20 DIAGNOSIS — Z823 Family history of stroke: Secondary | ICD-10-CM | POA: Diagnosis not present

## 2015-05-20 DIAGNOSIS — I130 Hypertensive heart and chronic kidney disease with heart failure and stage 1 through stage 4 chronic kidney disease, or unspecified chronic kidney disease: Secondary | ICD-10-CM | POA: Diagnosis present

## 2015-05-20 DIAGNOSIS — Z9981 Dependence on supplemental oxygen: Secondary | ICD-10-CM

## 2015-05-20 DIAGNOSIS — Z833 Family history of diabetes mellitus: Secondary | ICD-10-CM | POA: Diagnosis not present

## 2015-05-20 DIAGNOSIS — F17200 Nicotine dependence, unspecified, uncomplicated: Secondary | ICD-10-CM | POA: Diagnosis present

## 2015-05-20 DIAGNOSIS — F310 Bipolar disorder, current episode hypomanic: Secondary | ICD-10-CM

## 2015-05-20 DIAGNOSIS — I2782 Chronic pulmonary embolism: Secondary | ICD-10-CM | POA: Diagnosis present

## 2015-05-20 DIAGNOSIS — J9601 Acute respiratory failure with hypoxia: Secondary | ICD-10-CM | POA: Diagnosis present

## 2015-05-20 DIAGNOSIS — T464X5A Adverse effect of angiotensin-converting-enzyme inhibitors, initial encounter: Secondary | ICD-10-CM | POA: Diagnosis present

## 2015-05-20 DIAGNOSIS — Z6841 Body Mass Index (BMI) 40.0 and over, adult: Secondary | ICD-10-CM | POA: Diagnosis not present

## 2015-05-20 DIAGNOSIS — E1165 Type 2 diabetes mellitus with hyperglycemia: Secondary | ICD-10-CM | POA: Diagnosis present

## 2015-05-20 DIAGNOSIS — Z955 Presence of coronary angioplasty implant and graft: Secondary | ICD-10-CM | POA: Diagnosis not present

## 2015-05-20 DIAGNOSIS — J189 Pneumonia, unspecified organism: Secondary | ICD-10-CM | POA: Diagnosis present

## 2015-05-20 DIAGNOSIS — E875 Hyperkalemia: Secondary | ICD-10-CM | POA: Diagnosis present

## 2015-05-20 DIAGNOSIS — E1169 Type 2 diabetes mellitus with other specified complication: Secondary | ICD-10-CM | POA: Diagnosis present

## 2015-05-20 DIAGNOSIS — R062 Wheezing: Secondary | ICD-10-CM | POA: Diagnosis present

## 2015-05-20 DIAGNOSIS — Z9861 Coronary angioplasty status: Secondary | ICD-10-CM

## 2015-05-20 DIAGNOSIS — Z794 Long term (current) use of insulin: Secondary | ICD-10-CM | POA: Diagnosis not present

## 2015-05-20 DIAGNOSIS — IMO0001 Reserved for inherently not codable concepts without codable children: Secondary | ICD-10-CM | POA: Diagnosis present

## 2015-05-20 DIAGNOSIS — J440 Chronic obstructive pulmonary disease with acute lower respiratory infection: Principal | ICD-10-CM | POA: Diagnosis present

## 2015-05-20 DIAGNOSIS — N182 Chronic kidney disease, stage 2 (mild): Secondary | ICD-10-CM | POA: Diagnosis present

## 2015-05-20 DIAGNOSIS — D509 Iron deficiency anemia, unspecified: Secondary | ICD-10-CM

## 2015-05-20 DIAGNOSIS — D631 Anemia in chronic kidney disease: Secondary | ICD-10-CM | POA: Diagnosis present

## 2015-05-20 DIAGNOSIS — E1122 Type 2 diabetes mellitus with diabetic chronic kidney disease: Secondary | ICD-10-CM | POA: Diagnosis present

## 2015-05-20 DIAGNOSIS — F319 Bipolar disorder, unspecified: Secondary | ICD-10-CM | POA: Diagnosis present

## 2015-05-20 DIAGNOSIS — I252 Old myocardial infarction: Secondary | ICD-10-CM | POA: Diagnosis not present

## 2015-05-20 DIAGNOSIS — J441 Chronic obstructive pulmonary disease with (acute) exacerbation: Secondary | ICD-10-CM | POA: Diagnosis present

## 2015-05-20 DIAGNOSIS — Z79899 Other long term (current) drug therapy: Secondary | ICD-10-CM | POA: Diagnosis not present

## 2015-05-20 DIAGNOSIS — J45901 Unspecified asthma with (acute) exacerbation: Secondary | ICD-10-CM

## 2015-05-20 DIAGNOSIS — R0602 Shortness of breath: Secondary | ICD-10-CM

## 2015-05-20 DIAGNOSIS — J410 Simple chronic bronchitis: Secondary | ICD-10-CM

## 2015-05-20 DIAGNOSIS — N189 Chronic kidney disease, unspecified: Secondary | ICD-10-CM | POA: Diagnosis present

## 2015-05-20 DIAGNOSIS — I5042 Chronic combined systolic (congestive) and diastolic (congestive) heart failure: Secondary | ICD-10-CM | POA: Diagnosis present

## 2015-05-20 DIAGNOSIS — E119 Type 2 diabetes mellitus without complications: Secondary | ICD-10-CM

## 2015-05-20 DIAGNOSIS — I255 Ischemic cardiomyopathy: Secondary | ICD-10-CM | POA: Diagnosis present

## 2015-05-20 DIAGNOSIS — J449 Chronic obstructive pulmonary disease, unspecified: Secondary | ICD-10-CM | POA: Diagnosis present

## 2015-05-20 DIAGNOSIS — I10 Essential (primary) hypertension: Secondary | ICD-10-CM | POA: Diagnosis present

## 2015-05-20 LAB — URINALYSIS, ROUTINE W REFLEX MICROSCOPIC
BILIRUBIN URINE: NEGATIVE
Glucose, UA: NEGATIVE mg/dL
Ketones, ur: NEGATIVE mg/dL
Leukocytes, UA: NEGATIVE
Nitrite: NEGATIVE
PH: 5.5 (ref 5.0–8.0)
Protein, ur: NEGATIVE mg/dL
SPECIFIC GRAVITY, URINE: 1.014 (ref 1.005–1.030)

## 2015-05-20 LAB — COMPREHENSIVE METABOLIC PANEL
ALBUMIN: 3.3 g/dL — AB (ref 3.5–5.0)
ALK PHOS: 60 U/L (ref 38–126)
ALT: 21 U/L (ref 14–54)
AST: 16 U/L (ref 15–41)
Anion gap: 9 (ref 5–15)
BUN: 8 mg/dL (ref 6–20)
CALCIUM: 8.6 mg/dL — AB (ref 8.9–10.3)
CO2: 31 mmol/L (ref 22–32)
CREATININE: 0.68 mg/dL (ref 0.44–1.00)
Chloride: 101 mmol/L (ref 101–111)
GFR calc Af Amer: >60 mL/min (ref >60)
GFR calc non Af Amer: >60 mL/min (ref >60)
GLUCOSE: 181 mg/dL — AB (ref 65–99)
Potassium: 4.1 mmol/L (ref 3.5–5.1)
Sodium: 141 mmol/L (ref 135–145)
Total Bilirubin: 0.2 mg/dL — ABNORMAL LOW (ref 0.3–1.2)
Total Protein: 7.7 g/dL (ref 6.5–8.1)

## 2015-05-20 LAB — CBC WITH DIFFERENTIAL/PLATELET
BASOS ABS: 0.1 10*3/uL (ref 0.0–0.1)
BASOS PCT: 1 %
EOS PCT: 2 %
Eosinophils Absolute: 0.1 10*3/uL (ref 0.0–0.7)
HCT: 32.8 % — ABNORMAL LOW (ref 36.0–46.0)
HEMOGLOBIN: 9.4 g/dL — AB (ref 12.0–15.0)
LYMPHS ABS: 1.5 10*3/uL (ref 0.7–4.0)
Lymphocytes Relative: 25 %
MCH: 21.6 pg — ABNORMAL LOW (ref 26.0–34.0)
MCHC: 28.7 g/dL — ABNORMAL LOW (ref 30.0–36.0)
MCV: 75.4 fL — ABNORMAL LOW (ref 78.0–100.0)
Monocytes Absolute: 0.6 10*3/uL (ref 0.1–1.0)
Monocytes Relative: 10 %
NEUTROS PCT: 62 %
Neutro Abs: 3.9 10*3/uL (ref 1.7–7.7)
PLATELETS: 313 10*3/uL (ref 150–400)
RBC: 4.35 MIL/uL (ref 3.87–5.11)
RDW: 19.1 % — ABNORMAL HIGH (ref 11.5–15.5)
WBC: 6.1 10*3/uL (ref 4.0–10.5)

## 2015-05-20 LAB — URINE MICROSCOPIC-ADD ON

## 2015-05-20 LAB — I-STAT TROPONIN, ED: Troponin i, poc: 0.01 ng/mL (ref 0.00–0.08)

## 2015-05-20 MED ORDER — NITROGLYCERIN 0.4 MG SL SUBL: 0.4000 mg | SUBLINGUAL_TABLET | SUBLINGUAL | Status: DC | PRN

## 2015-05-20 MED ORDER — METHYLPREDNISOLONE SODIUM SUCC 125 MG IJ SOLR
125.0000 mg | Freq: Once | INTRAMUSCULAR | Status: AC
  Administered 2015-05-20: 125 mg via INTRAVENOUS
  Filled 2015-05-20: qty 2

## 2015-05-20 MED ORDER — ALBUTEROL SULFATE (2.5 MG/3ML) 0.083% IN NEBU
5.0000 mg | INHALATION_SOLUTION | Freq: Once | RESPIRATORY_TRACT | Status: AC
  Administered 2015-05-20: 5 mg via RESPIRATORY_TRACT
  Filled 2015-05-20: qty 6

## 2015-05-20 MED ORDER — IPRATROPIUM-ALBUTEROL 0.5-2.5 (3) MG/3ML IN SOLN: 3.0000 mL | Freq: Four times a day (QID) | RESPIRATORY_TRACT | Status: DC

## 2015-05-20 MED ORDER — IPRATROPIUM BROMIDE 0.02 % IN SOLN
0.5000 mg | Freq: Once | RESPIRATORY_TRACT | Status: AC
  Administered 2015-05-20: 0.5 mg via RESPIRATORY_TRACT
  Filled 2015-05-20: qty 2.5

## 2015-05-20 MED ORDER — ALBUTEROL (5 MG/ML) CONTINUOUS INHALATION SOLN
10.0000 mg/h | INHALATION_SOLUTION | RESPIRATORY_TRACT | Status: DC
  Administered 2015-05-20: 10 mg/h via RESPIRATORY_TRACT
  Filled 2015-05-20: qty 20

## 2015-05-20 MED ORDER — TRAMADOL HCL 50 MG PO TABS
50.0000 mg | ORAL_TABLET | Freq: Once | ORAL | Status: AC
  Administered 2015-05-20: 50 mg via ORAL
  Filled 2015-05-20: qty 1

## 2015-05-20 NOTE — ED Notes (Signed)
Phlebotomy at bedside attempting blood draw

## 2015-05-20 NOTE — ED Notes (Signed)
Hospitalist at bedside 

## 2015-05-20 NOTE — ED Notes (Signed)
3 nurses unsuccessful at IV attempts. IV team consulted. Lab asked to try and get blood.

## 2015-05-20 NOTE — ED Notes (Signed)
Patient transported to X-ray 

## 2015-05-20 NOTE — ED Provider Notes (Signed)
CSN: [id number]     Arrival date & time 05/20/15  1632 History   First MD Initiated Contact with Patient 05/20/15 1659     Chief Complaint  Patient presents with  . Shortness of Breath  . Wheezing     (Consider location/radiation/quality/duration/timing/severity/associated sxs/prior Treatment) HPI Comments: Patient with history of COPD and asthma. Reports not feeling well yesterday. Increased shortness of breath today. No documented fever. Neb treatment of albuterol/atrovent initiated by EMS. O2 sats are reported to be 87% on room air prior to neb initiation.  Patient is a 54 y.o. female presenting with shortness of breath and wheezing. The history is provided by the patient. No language interpreter was used.  Shortness of Breath Severity:  Moderate Onset quality:  Gradual Duration:  2 days Timing:  Constant Progression:  Worsening Chronicity:  Recurrent Ineffective treatments:  Inhaler Associated symptoms: chest pain, cough, sputum production and wheezing   Associated symptoms: no abdominal pain, no fever, no rash and no vomiting   Risk factors: obesity   Wheezing Associated symptoms: chest pain, cough, shortness of breath and sputum production   Associated symptoms: no fever and no rash     Past Medical History  Diagnosis Date  . Diabetes mellitus without complication ([place])   . PE (pulmonary embolism)   . Hypercholesterolemia   . Hypertension   . COPD (chronic obstructive pulmonary disease) ([place])   . Asthma   . Coronary artery disease   . CHF (congestive heart failure) ([place])   . Mental disorder     BIPOLAR  . Shortness of breath   . GERD (gastroesophageal reflux disease)   . Neuromuscular disorder ([place])     DIABETIC NEUROPATHY  . Ischemic cardiomyopathy   . Bipolar disorder ([place])   . Post-traumatic stress syndrome   . On home oxygen therapy     "2L most all the time" (02/06/2014)  . MI (myocardial infarction) ([place])   . PTSD (post-traumatic stress disorder)   .  Depression   . Heart failure ([place])   . Bipolar disorder [facility])    Past Surgical History  Procedure Laterality Date  . Coronary angioplasty with stent placement      CAD in 2006 x 2 and 2009 2 more- place din REx in [place] and [place] med  . Esophagogastroduodenoscopy (egd) with propofol N/A 08/03/2013    Procedure: ESOPHAGOGASTRODUODENOSCOPY (EGD) WITH PROPOFOL;  Surgeon: Jeryl Columbia, MD;  [facility];  Service: Endoscopy;  Laterality: N/A;  . Colonoscopy with propofol N/A 08/03/2013    Procedure: COLONOSCOPY WITH PROPOFOL;  Surgeon: Jeryl Columbia, MD;  Location: [facility];  Service: Endoscopy;  Laterality: N/A;  . Coronary angioplasty with stent placement  10/07/2013    Xience Alpine DES 2.75  mm x 15  mm  . Left heart catheterization with coronary angiogram N/A 10/07/2013    Procedure: LEFT HEART CATHETERIZATION WITH CORONARY ANGIOGRAM;  Surgeon: Leonie Man, MD;  Location: [facility] CATH LAB;  Service: Cardiovascular;  Laterality: N/A;   Family History  Problem Relation Age of Onset  . Stroke Mother   . Colon cancer Father   . Breast cancer Paternal Aunt   . Emphysema Brother   . Asthma Paternal Aunt    Social History  Substance Use Topics  . Smoking status: Former Smoker -- 0.50 packs/day for 20 years    Types: Cigarettes    Quit date: 04/30/2014  . Smokeless tobacco: Never Used     Comment: smoked off and on x 20  years  . Alcohol Use: No   OB History    No data available     Review of Systems  Constitutional: Negative for fever.  Respiratory: Positive for cough, sputum production, shortness of breath and wheezing.   Cardiovascular: Positive for chest pain.  Gastrointestinal: Negative for nausea, vomiting and abdominal pain.  Skin: Negative for rash.  All other systems reviewed and are negative.     Allergies  Metolazone; Plavix; and Nsaids  Home Medications   Prior to Admission medications   Medication Sig Start Date End Date Taking? Authorizing Provider   aspirin 81 MG tablet Take 1 tablet (81 mg total) by mouth daily. 10/09/13  Yes Rhonda G Barrett, PA-C  EFFIENT 10 MG TABS tablet TAKE ONE TABLET BY MOUTH DAILY. 03/18/15  Yes Shaune Pascal Bensimhon, MD  fluticasone (FLONASE) 50 MCG/ACT nasal spray USE 2 SPRAYS IN EACH NOSTRIL TWICE DAILY 04/21/15  Yes Olugbemiga E Doreene Burke, MD  furosemide (LASIX) 40 MG tablet TAKE 2 TABLETS BY MOUTH IN THE MORNING AND THEN TAKE 1 TABLET IN THE EVENING 04/21/15  Yes Olugbemiga E Doreene Burke, MD  insulin aspart (NOVOLOG) 100 UNIT/ML injection Inject 15-20 Units into the skin 3 (three) times daily before meals. Sliding scale 09/16/14  Yes Olugbemiga E Doreene Burke, MD  insulin detemir (LEVEMIR) 100 UNIT/ML injection Inject 0.42 mLs (42 Units total) into the skin at bedtime. 01/15/15  Yes Tresa Garter, MD  nitroGLYCERIN (NITROSTAT) 0.4 MG SL tablet Place 1 tablet (0.4 mg total) under the tongue every 5 (five) minutes as needed for chest pain. MUST MAKE APPOINTMENT FOR FUTURE REFILLS 05/20/15  Yes Larey Dresser, MD  omeprazole (PRILOSEC) 40 MG capsule TAKE 1 CAPSULE BY MOUTH TWICE DAILY 04/21/15  Yes Tresa Garter, MD  PROAIR HFA 108 (90 BASE) MCG/ACT inhaler INHALE 2 PUFFS BY MOUTH 4 TIMES DAILY AS NEEDED 04/21/15  Yes Tresa Garter, MD  spironolactone (ALDACTONE) 25 MG tablet Take 1 tablet (25 mg total) by mouth daily. 10/31/14  Yes Tresa Garter, MD  ACCU-CHEK AVIVA PLUS test strip USE 1 STRIP TO CHECK GLUCOSE 3 TIMES DAILY 02/18/15   Tresa Garter, MD  ACCU-CHEK SOFTCLIX LANCETS lancets USE 1 LANCET TO CHECK GLUCOSE 3 TIMES DAILY 02/18/15   Tresa Garter, MD  acetaminophen-codeine (TYLENOL #3) 300-30 MG per tablet Take 1 tablet by mouth every 4 (four) hours as needed. Patient not taking: Reported on 02/21/2015 10/31/14   Tresa Garter, MD  bisoprolol (ZEBETA) 5 MG tablet Take 1 tablet (5 mg total) by mouth daily. Patient not taking: Reported on 02/21/2015 01/07/15   Tanda Rockers, MD  cycloSPORINE  (RESTASIS) 0.05 % ophthalmic emulsion Place 2 drops into both eyes 2 (two) times daily as needed (for dry eyes).     Historical Provider, MD  diazepam (VALIUM) 10 MG tablet Take 0.5 tablets (5 mg total) by mouth every 8 (eight) hours as needed for anxiety. Patient not taking: Reported on 02/21/2015 07/16/13   Allie Bossier, MD  EASY TOUCH PEN NEEDLES 31G X 8 MM MISC USE AS DIRECTED TO INJECT INSULIN 02/18/15   Tresa Garter, MD  EFFIENT 10 MG TABS tablet TAKE ONE TABLET BY MOUTH DAILY. Patient not taking: Reported on 05/20/2015 04/17/15   Jolaine Artist, MD  Glucose Blood (ACCU-CHEK ADVANTAGE TEST VI) 1 each by Other route See admin instructions. Check blood sugar 4 times daily.    Historical Provider, MD  insulin aspart (NOVOLOG) 100 UNIT/ML FlexPen Inject  15-20 Units into the skin 3 (three) times daily with meals. Patient not taking: Reported on 02/21/2015 09/16/14   Tresa Garter, MD  INSULIN SYRINGE .5CC/29G 29G X 1/2" 0.5 ML MISC Use as instructed by physician 03/28/14   Tresa Garter, MD  ipratropium-albuterol (DUONEB) 0.5-2.5 (3) MG/3ML SOLN Take 3 mLs by nebulization every 6 (six) hours as needed. Patient taking differently: Take 3 mLs by nebulization every 6 (six) hours as needed (wheezing and shortness of breath).  09/16/14   Tresa Garter, MD  ondansetron (ZOFRAN ODT) 8 MG disintegrating tablet Take 1 tablet (8 mg total) by mouth every 8 (eight) hours as needed for nausea or vomiting. 02/21/15   Jola Schmidt, MD  Paliperidone Palmitate (INVEGA SUSTENNA) 117 MG/0.75ML SUSP Inject 156 mg into the muscle every 3 (three) months.     Historical Provider, MD  pravastatin (PRAVACHOL) 80 MG tablet Take 1 tablet (80 mg total) by mouth every evening. Patient not taking: Reported on 05/20/2015 10/31/14   Tresa Garter, MD  prazosin (MINIPRESS) 5 MG capsule TAKE 1 CAPSULE BY MOUTH EVERY NIGHT AT BEDTIME FOR NIGHTMARES Patient taking differently: TAKE 1 CAPSULE BY MOUTH EVERY  NIGHT AT BEDTIME AS NEEDED FOR NIGHTMARES 04/21/15   Tresa Garter, MD  promethazine (PHENERGAN) 25 MG tablet Take 1 tablet (25 mg total) by mouth every 6 (six) hours as needed for nausea or vomiting. 02/21/15   Jola Schmidt, MD  spironolactone (ALDACTONE) 25 MG tablet TAKE 1 TABLET BY MOUTH EVERY DAY *NEW DOSAGE Patient not taking: Reported on 05/20/2015 04/17/15   Jolaine Artist, MD  valsartan (DIOVAN) 160 MG tablet Take 1 tablet (160 mg total) by mouth daily. Patient not taking: Reported on 02/21/2015 01/07/15   Tanda Rockers, MD   BP 145/68 mmHg  Pulse 90  Temp(Src) 97.5 F (36.4 C) (Oral)  Resp 20  Ht 5\' 7"  (1.702 m)  Wt 120.657 kg  BMI 41.65 kg/m2  SpO2 100%  LMP 05/30/2013 Physical Exam  Constitutional: She is oriented to person, place, and time. She appears well-developed and well-nourished.  HENT:  Head: Normocephalic.  Eyes: Conjunctivae are normal.  Neck: Neck supple.  Cardiovascular: Normal rate.   Pulmonary/Chest: She is in respiratory distress. She has wheezes. She exhibits tenderness.  Abdominal: Soft. Bowel sounds are normal.  Musculoskeletal: She exhibits no edema or tenderness.  Lymphadenopathy:    She has no cervical adenopathy.  Neurological: She is alert and oriented to person, place, and time.  Skin: Skin is warm and dry. No rash noted.  Psychiatric: She has a normal mood and affect.  Nursing note and vitals reviewed.   ED Course  Procedures (including critical care time) Labs Review Labs Reviewed  CBC WITH DIFFERENTIAL/PLATELET - Abnormal; Notable for the following:    Hemoglobin 9.4 (*)    HCT 32.8 (*)    MCV 75.4 (*)    MCH 21.6 (*)    MCHC 28.7 (*)    RDW 19.1 (*)    All other components within normal limits  COMPREHENSIVE METABOLIC PANEL - Abnormal; Notable for the following:    Glucose, Bld 181 (*)    Calcium 8.6 (*)    Albumin 3.3 (*)    Total Bilirubin 0.2 (*)    All other components within normal limits  URINALYSIS, ROUTINE W  REFLEX MICROSCOPIC (NOT AT [facility]) - Abnormal; Notable for the following:    Hgb urine dipstick TRACE (*)    All other components within normal limits  URINE MICROSCOPIC-ADD ON - Abnormal; Notable for the following:    Squamous Epithelial / LPF 0-5 (*)    Bacteria, UA RARE (*)    All other components within normal limits  I-STAT TROPOININ, ED    Imaging Review No results found. I have personally reviewed and evaluated these images and lab results as part of my medical decision-making.   EKG Interpretation   Date/Time:  Tuesday May 20 2015 17:27:40 EST Ventricular Rate:  93 PR Interval:  153 QRS Duration: 113 QT Interval:  358 QTC Calculation: 445 R Axis:   48 Text Interpretation:  Sinus rhythm Multiple ventricular premature  complexes Anterior infarct, old Baseline wander in lead(s) V2 agree. no  sig change from old Confirmed by Johnney Killian, MD, Jeannie Done [phone]) on 05/20/2015  6:40:33 PM     Patient has been treated with wheeze protocol and steroids--continues to have wheezing. Oxygen saturation decreases to 78% with increased work of breathing while ambulating.  Will request admission. MDM   Final diagnoses:  None   COPD exacerbation.     Etta Quill, NP 05/21/15 [id number]  Charlesetta Shanks, MD 06/02/15 [phone]

## 2015-05-20 NOTE — ED Notes (Signed)
Pt taken off oxygen, and pt dropped to 78% while ambulating

## 2015-05-20 NOTE — ED Notes (Signed)
Unsuccessful IV attempt.  Will consult IV team.  °

## 2015-05-20 NOTE — ED Notes (Signed)
[facility] nebulization treatment currently still running

## 2015-05-20 NOTE — ED Notes (Addendum)
Patient ambulated by Chrys Racer, NT SpO2 dropped to 78% without oxygen. Shanon Brow, NP, made aware.

## 2015-05-20 NOTE — ED Notes (Signed)
NP made aware of delay on labs/meds.

## 2015-05-20 NOTE — ED Notes (Signed)
IV team at bedside 

## 2015-05-20 NOTE — ED Notes (Signed)
IV attempted by this RN and Nelida Gores, RN. Altha Harm, RN will attempt

## 2015-05-20 NOTE — ED Notes (Addendum)
Per EMS patient states she began to feel shaky and "not well" yesterday. Shortness of breath started today (coughing up white sputum). Expiratory wheezing; diminished in lower lobes; neb treatment started in route (total 10 mg albuteral, .5 mg atrovent) Hx of COPD and asthma 87% on room air.

## 2015-05-21 ENCOUNTER — Encounter (HOSPITAL_COMMUNITY): Payer: Self-pay | Admitting: Internal Medicine

## 2015-05-21 DIAGNOSIS — E1169 Type 2 diabetes mellitus with other specified complication: Secondary | ICD-10-CM | POA: Diagnosis present

## 2015-05-21 DIAGNOSIS — J441 Chronic obstructive pulmonary disease with (acute) exacerbation: Secondary | ICD-10-CM

## 2015-05-21 DIAGNOSIS — E875 Hyperkalemia: Secondary | ICD-10-CM | POA: Diagnosis present

## 2015-05-21 DIAGNOSIS — Z794 Long term (current) use of insulin: Secondary | ICD-10-CM

## 2015-05-21 DIAGNOSIS — I2782 Chronic pulmonary embolism: Secondary | ICD-10-CM

## 2015-05-21 DIAGNOSIS — E785 Hyperlipidemia, unspecified: Secondary | ICD-10-CM

## 2015-05-21 DIAGNOSIS — I2510 Atherosclerotic heart disease of native coronary artery without angina pectoris: Secondary | ICD-10-CM

## 2015-05-21 DIAGNOSIS — E1121 Type 2 diabetes mellitus with diabetic nephropathy: Secondary | ICD-10-CM

## 2015-05-21 DIAGNOSIS — D631 Anemia in chronic kidney disease: Secondary | ICD-10-CM | POA: Diagnosis present

## 2015-05-21 DIAGNOSIS — N189 Chronic kidney disease, unspecified: Secondary | ICD-10-CM | POA: Diagnosis present

## 2015-05-21 DIAGNOSIS — Z9861 Coronary angioplasty status: Secondary | ICD-10-CM

## 2015-05-21 DIAGNOSIS — Z7901 Long term (current) use of anticoagulants: Secondary | ICD-10-CM

## 2015-05-21 DIAGNOSIS — I10 Essential (primary) hypertension: Secondary | ICD-10-CM

## 2015-05-21 DIAGNOSIS — N182 Chronic kidney disease, stage 2 (mild): Secondary | ICD-10-CM

## 2015-05-21 DIAGNOSIS — I5042 Chronic combined systolic (congestive) and diastolic (congestive) heart failure: Secondary | ICD-10-CM

## 2015-05-21 DIAGNOSIS — J9601 Acute respiratory failure with hypoxia: Secondary | ICD-10-CM

## 2015-05-21 LAB — CBC
HCT: 31.7 % — ABNORMAL LOW (ref 36.0–46.0)
HEMOGLOBIN: 8.8 g/dL — AB (ref 12.0–15.0)
MCH: 21.3 pg — ABNORMAL LOW (ref 26.0–34.0)
MCHC: 27.8 g/dL — AB (ref 30.0–36.0)
MCV: 76.8 fL — ABNORMAL LOW (ref 78.0–100.0)
PLATELETS: 321 10*3/uL (ref 150–400)
RBC: 4.13 MIL/uL (ref 3.87–5.11)
RDW: 19.3 % — AB (ref 11.5–15.5)
WBC: 7.3 10*3/uL (ref 4.0–10.5)

## 2015-05-21 LAB — FERRITIN: FERRITIN: 14 ng/mL (ref 11–307)

## 2015-05-21 LAB — BASIC METABOLIC PANEL
Anion gap: 8 (ref 5–15)
BUN: 12 mg/dL (ref 6–20)
CO2: 32 mmol/L (ref 22–32)
Calcium: 8.7 mg/dL — ABNORMAL LOW (ref 8.9–10.3)
Chloride: 97 mmol/L — ABNORMAL LOW (ref 101–111)
Creatinine, Ser: 0.97 mg/dL (ref 0.44–1.00)
GFR calc Af Amer: >60 mL/min (ref >60)
Glucose, Bld: 364 mg/dL — ABNORMAL HIGH (ref 65–99)
POTASSIUM: 5.6 mmol/L — AB (ref 3.5–5.1)
Sodium: 137 mmol/L (ref 135–145)

## 2015-05-21 LAB — GLUCOSE, CAPILLARY
GLUCOSE-CAPILLARY: 339 mg/dL — AB (ref 65–99)
GLUCOSE-CAPILLARY: 262 mg/dL — AB (ref 65–99)
Glucose-Capillary: 297 mg/dL — ABNORMAL HIGH (ref 65–99)
Glucose-Capillary: 146 mg/dL — ABNORMAL HIGH (ref 65–99)
Glucose-Capillary: 216 mg/dL — ABNORMAL HIGH (ref 65–99)

## 2015-05-21 MED ORDER — PANTOPRAZOLE SODIUM 40 MG PO TBEC
40.0000 mg | DELAYED_RELEASE_TABLET | Freq: Every day | ORAL | Status: DC
  Administered 2015-05-21 – 2015-05-25 (×5): 40 mg via ORAL
  Filled 2015-05-21 (×6): qty 1

## 2015-05-21 MED ORDER — PRASUGREL HCL 10 MG PO TABS
10.0000 mg | ORAL_TABLET | Freq: Every day | ORAL | Status: DC
  Administered 2015-05-21 – 2015-05-25 (×5): 10 mg via ORAL
  Filled 2015-05-21 (×6): qty 1

## 2015-05-21 MED ORDER — IPRATROPIUM BROMIDE 0.02 % IN SOLN
0.5000 mg | Freq: Once | RESPIRATORY_TRACT | Status: AC
  Administered 2015-05-21: 0.5 mg via RESPIRATORY_TRACT
  Filled 2015-05-21: qty 2.5

## 2015-05-21 MED ORDER — IPRATROPIUM BROMIDE 0.02 % IN SOLN
0.5000 mg | Freq: Four times a day (QID) | RESPIRATORY_TRACT | Status: DC
  Administered 2015-05-21: 0.5 mg via RESPIRATORY_TRACT
  Filled 2015-05-21: qty 2.5

## 2015-05-21 MED ORDER — GUAIFENESIN-CODEINE 100-10 MG/5ML PO SOLN: 5.0000 mL | Freq: Four times a day (QID) | ORAL | Status: DC | PRN

## 2015-05-21 MED ORDER — FLUTICASONE PROPIONATE 50 MCG/ACT NA SUSP
2.0000 | Freq: Two times a day (BID) | NASAL | Status: DC
  Administered 2015-05-21 – 2015-05-25 (×11): 2 via NASAL
  Filled 2015-05-21: qty 16

## 2015-05-21 MED ORDER — ALBUTEROL SULFATE (2.5 MG/3ML) 0.083% IN NEBU
5.0000 mg | INHALATION_SOLUTION | Freq: Once | RESPIRATORY_TRACT | Status: AC
  Administered 2015-05-21: 5 mg via RESPIRATORY_TRACT
  Filled 2015-05-21: qty 6

## 2015-05-21 MED ORDER — INFLUENZA VAC SPLIT QUAD 0.5 ML IM SUSY
0.5000 mL | PREFILLED_SYRINGE | INTRAMUSCULAR | Status: AC
  Administered 2015-05-22: 0.5 mL via INTRAMUSCULAR
  Filled 2015-05-21 (×2): qty 0.5

## 2015-05-21 MED ORDER — IPRATROPIUM-ALBUTEROL 0.5-2.5 (3) MG/3ML IN SOLN: 3.0000 mL | Freq: Four times a day (QID) | RESPIRATORY_TRACT | Status: DC

## 2015-05-21 MED ORDER — CARVEDILOL 12.5 MG PO TABS
12.5000 mg | ORAL_TABLET | Freq: Two times a day (BID) | ORAL | Status: DC
  Administered 2015-05-21 – 2015-05-25 (×10): 12.5 mg via ORAL
  Filled 2015-05-21 (×14): qty 1

## 2015-05-21 MED ORDER — INSULIN ASPART 100 UNIT/ML ~~LOC~~ SOLN
0.0000 [IU] | Freq: Three times a day (TID) | SUBCUTANEOUS | Status: DC
  Administered 2015-05-21: 2 [IU] via SUBCUTANEOUS
  Administered 2015-05-21 – 2015-05-22 (×3): 8 [IU] via SUBCUTANEOUS
  Administered 2015-05-22: 2 [IU] via SUBCUTANEOUS
  Administered 2015-05-23: 8 [IU] via SUBCUTANEOUS
  Administered 2015-05-23: 3 [IU] via SUBCUTANEOUS
  Administered 2015-05-23 – 2015-05-24 (×2): 8 [IU] via SUBCUTANEOUS
  Administered 2015-05-24: 5 [IU] via SUBCUTANEOUS
  Administered 2015-05-24: 8 [IU] via SUBCUTANEOUS
  Administered 2015-05-25: 3 [IU] via SUBCUTANEOUS
  Administered 2015-05-25 – 2015-05-26 (×2): 2 [IU] via SUBCUTANEOUS

## 2015-05-21 MED ORDER — PREDNISONE 20 MG PO TABS
40.0000 mg | ORAL_TABLET | Freq: Every day | ORAL | Status: DC
  Administered 2015-05-21: 40 mg via ORAL
  Filled 2015-05-21 (×3): qty 2

## 2015-05-21 MED ORDER — INSULIN ASPART 100 UNIT/ML ~~LOC~~ SOLN
0.0000 [IU] | Freq: Every day | SUBCUTANEOUS | Status: DC
  Administered 2015-05-21: 5 [IU] via SUBCUTANEOUS
  Administered 2015-05-21 – 2015-05-23 (×3): 2 [IU] via SUBCUTANEOUS
  Administered 2015-05-24: 5 [IU] via SUBCUTANEOUS

## 2015-05-21 MED ORDER — PRAZOSIN HCL 5 MG PO CAPS
5.0000 mg | ORAL_CAPSULE | Freq: Two times a day (BID) | ORAL | Status: DC | PRN
  Filled 2015-05-21: qty 1

## 2015-05-21 MED ORDER — PRAVASTATIN SODIUM 80 MG PO TABS
80.0000 mg | ORAL_TABLET | Freq: Every evening | ORAL | Status: DC
Start: 2015-05-21 — End: 2015-05-26
  Administered 2015-05-21 – 2015-05-25 (×5): 80 mg via ORAL
  Filled 2015-05-21 (×6): qty 1

## 2015-05-21 MED ORDER — INSULIN DETEMIR 100 UNIT/ML ~~LOC~~ SOLN
42.0000 [IU] | Freq: Every day | SUBCUTANEOUS | Status: DC
  Administered 2015-05-21 – 2015-05-22 (×3): 42 [IU] via SUBCUTANEOUS
  Filled 2015-05-21 (×4): qty 0.42

## 2015-05-21 MED ORDER — ENOXAPARIN SODIUM 60 MG/0.6ML ~~LOC~~ SOLN
60.0000 mg | Freq: Every day | SUBCUTANEOUS | Status: DC
Start: 2015-05-21 — End: 2015-05-26
  Administered 2015-05-21 – 2015-05-25 (×5): 60 mg via SUBCUTANEOUS
  Filled 2015-05-21 (×6): qty 0.6

## 2015-05-21 MED ORDER — ALBUTEROL SULFATE (2.5 MG/3ML) 0.083% IN NEBU: 2.5000 mg | INHALATION_SOLUTION | RESPIRATORY_TRACT | Status: DC | PRN

## 2015-05-21 MED ORDER — SPIRONOLACTONE 25 MG PO TABS
25.0000 mg | ORAL_TABLET | Freq: Every day | ORAL | Status: DC
  Administered 2015-05-21: 25 mg via ORAL
  Filled 2015-05-21: qty 1

## 2015-05-21 MED ORDER — LISINOPRIL 10 MG PO TABS
10.0000 mg | ORAL_TABLET | Freq: Every day | ORAL | Status: DC
  Administered 2015-05-21: 10 mg via ORAL
  Filled 2015-05-21: qty 1

## 2015-05-21 MED ORDER — BOOST / RESOURCE BREEZE PO LIQD
1.0000 | Freq: Three times a day (TID) | ORAL | Status: DC
  Administered 2015-05-21 – 2015-05-25 (×7): 1 via ORAL

## 2015-05-21 MED ORDER — IPRATROPIUM-ALBUTEROL 0.5-2.5 (3) MG/3ML IN SOLN
3.0000 mL | RESPIRATORY_TRACT | Status: DC | PRN
  Administered 2015-05-22: 3 mL via RESPIRATORY_TRACT
  Filled 2015-05-21: qty 3

## 2015-05-21 MED ORDER — ENOXAPARIN SODIUM 40 MG/0.4ML ~~LOC~~ SOLN: 40.0000 mg | SUBCUTANEOUS | Status: DC

## 2015-05-21 MED ORDER — FERROUS SULFATE 325 (65 FE) MG PO TABS
325.0000 mg | ORAL_TABLET | Freq: Two times a day (BID) | ORAL | Status: DC
  Administered 2015-05-21 – 2015-05-26 (×11): 325 mg via ORAL
  Filled 2015-05-21 (×14): qty 1

## 2015-05-21 MED ORDER — IPRATROPIUM-ALBUTEROL 0.5-2.5 (3) MG/3ML IN SOLN
3.0000 mL | RESPIRATORY_TRACT | Status: DC
  Administered 2015-05-21 – 2015-05-26 (×30): 3 mL via RESPIRATORY_TRACT
  Filled 2015-05-21 (×29): qty 3

## 2015-05-21 MED ORDER — ASPIRIN 81 MG PO CHEW
81.0000 mg | CHEWABLE_TABLET | Freq: Every day | ORAL | Status: DC
  Administered 2015-05-21 – 2015-05-25 (×5): 81 mg via ORAL
  Filled 2015-05-21 (×6): qty 1

## 2015-05-21 MED ORDER — ALBUTEROL SULFATE (2.5 MG/3ML) 0.083% IN NEBU
2.5000 mg | INHALATION_SOLUTION | Freq: Four times a day (QID) | RESPIRATORY_TRACT | Status: DC
  Administered 2015-05-21: 2.5 mg via RESPIRATORY_TRACT
  Filled 2015-05-21: qty 3

## 2015-05-21 NOTE — H&P (Signed)
History and Physical  Patient Name: Tamara Silva     [id number]    DOB: Jan 18, 1961    DOA: 05/20/2015 Referring physician: Etta Quill, PA-C PCP: Angelica Chessman, MD      Chief Complaint: Dyspnea, wheezing and cough  HPI: Tamara Silva is a 54 y.o. female with a past medical history significant for IDDM, chronic systolic and diastolic CHF from ischemic CM last EF 40%, COPD on home O2 intermittently, chronic PE not on warfarin, IDDM, and Bipolar who presents with 1 week worsening dyspnea and wheezing.  The patient was in her usual state of health until 1 week ago when she had gradual onset of dyspnea, cough, and wheezing.  This got progressively worse until today when she came to the ER.  In the ED, the patient was wheezing, tachypneic and required 4L [place] to maintain her O2 saturation >90%.  She had no leukocytosis and a chest x-ray showed no new focal opacity but ?sarcoid.  Nebulized bronchodilators and IV Solumedrol were administered, the patient felt some relief, and [facility] were asked to admit for COPD exacerbation.  Of note, the patient lost her nebulizer and so can't give herself home bronchodilator treatments.  She is also running out of oxygen and can't get it renewed.  She had spirometry in August that showed FEV1 34% and last echocardiogram in February that showed EF 40-45%.  She last saw Cardiology in Feb and Pulmonary in August.  She was prescribed valsartan and bisoprolol by Pulmonology in place of lisinopril and carvedilol because of concern the latter would affect her respiratory symptoms, but she could not afford either medicine and is taking only lisinopril now.  She still takes Effient.  She takes furosemide only as needed.  She has a nephew with sarcoidosis.     Review of Systems:  Pt complains of cough, dyspnea, wheezing. Pt denies any leg swelling, abdominal girth, worsening orthopnea, fever, chills, sputum.  All other systems negative except as just noted or noted in  the history of present illness.  Allergies  Allergen Reactions  . Metolazone Other (See Comments)    Dizziness and falling  . Plavix [Clopidogrel Bisulfate] Other (See Comments)    High PRU's - non-responder  . Nsaids Other (See Comments)    "I do not take NSAIDs d/t interfering with other meds"    Prior to Admission medications   Medication Sig Start Date End Date Taking? Authorizing Provider  aspirin 81 MG tablet Take 1 tablet (81 mg total) by mouth daily. 10/09/13  Yes Rhonda G Barrett, PA-C  EFFIENT 10 MG TABS tablet TAKE ONE TABLET BY MOUTH DAILY. 03/18/15  Yes Shaune Pascal Bensimhon, MD  fluticasone (FLONASE) 50 MCG/ACT nasal spray USE 2 SPRAYS IN EACH NOSTRIL TWICE DAILY 04/21/15  Yes Olugbemiga E Doreene Burke, MD  furosemide (LASIX) 40 MG tablet TAKE 2 TABLETS BY MOUTH IN THE MORNING AND THEN TAKE 1 TABLET IN THE EVENING 04/21/15  Yes Olugbemiga E Doreene Burke, MD  insulin aspart (NOVOLOG) 100 UNIT/ML injection Inject 15-20 Units into the skin 3 (three) times daily before meals. Sliding scale 09/16/14  Yes Olugbemiga E Doreene Burke, MD  insulin detemir (LEVEMIR) 100 UNIT/ML injection Inject 0.42 mLs (42 Units total) into the skin at bedtime. 01/15/15  Yes Tresa Garter, MD  nitroGLYCERIN (NITROSTAT) 0.4 MG SL tablet Place 1 tablet (0.4 mg total) under the tongue every 5 (five) minutes as needed for chest pain. MUST MAKE APPOINTMENT FOR FUTURE REFILLS 05/20/15  Yes Larey Dresser, MD  omeprazole (  PRILOSEC) 40 MG capsule TAKE 1 CAPSULE BY MOUTH TWICE DAILY 04/21/15  Yes Olugbemiga E Doreene Burke, MD  PROAIR HFA 108 (90 BASE) MCG/ACT inhaler INHALE 2 PUFFS BY MOUTH 4 TIMES DAILY AS NEEDED 04/21/15  Yes Tresa Garter, MD  spironolactone (ALDACTONE) 25 MG tablet Take 1 tablet (25 mg total) by mouth daily. 10/31/14  Yes Tresa Garter, MD  ACCU-CHEK AVIVA PLUS test strip USE 1 STRIP TO CHECK GLUCOSE 3 TIMES DAILY 02/18/15   Tresa Garter, MD  ACCU-CHEK SOFTCLIX LANCETS lancets USE 1 LANCET TO CHECK  GLUCOSE 3 TIMES DAILY 02/18/15   Tresa Garter, MD  acetaminophen-codeine (TYLENOL #3) 300-30 MG per tablet Take 1 tablet by mouth every 4 (four) hours as needed. Patient not taking: Reported on 02/21/2015 10/31/14   Tresa Garter, MD  bisoprolol (ZEBETA) 5 MG tablet Take 1 tablet (5 mg total) by mouth daily. Patient not taking: Reported on 02/21/2015 01/07/15   Tanda Rockers, MD  cycloSPORINE (RESTASIS) 0.05 % ophthalmic emulsion Place 2 drops into both eyes 2 (two) times daily as needed (for dry eyes).     Historical Provider, MD  EASY TOUCH PEN NEEDLES 31G X 8 MM MISC USE AS DIRECTED TO INJECT INSULIN 02/18/15   Tresa Garter, MD  Glucose Blood (ACCU-CHEK ADVANTAGE TEST VI) 1 each by Other route See admin instructions. Check blood sugar 4 times daily.    Historical Provider, MD  INSULIN SYRINGE .5CC/29G 29G X 1/2" 0.5 ML MISC Use as instructed by physician 03/28/14   Tresa Garter, MD  ipratropium-albuterol (DUONEB) 0.5-2.5 (3) MG/3ML SOLN Take 3 mLs by nebulization every 6 (six) hours as needed. Patient taking differently: Take 3 mLs by nebulization every 6 (six) hours as needed (wheezing and shortness of breath).  09/16/14   Tresa Garter, MD  ondansetron (ZOFRAN ODT) 8 MG disintegrating tablet Take 1 tablet (8 mg total) by mouth every 8 (eight) hours as needed for nausea or vomiting. 02/21/15   Jola Schmidt, MD  Paliperidone Palmitate (INVEGA SUSTENNA) 117 MG/0.75ML SUSP Inject 156 mg into the muscle every 3 (three) months.     Historical Provider, MD  pravastatin (PRAVACHOL) 80 MG tablet Take 1 tablet (80 mg total) by mouth every evening. Patient not taking: Reported on 05/20/2015 10/31/14   Tresa Garter, MD  prazosin (MINIPRESS) 5 MG capsule TAKE 1 CAPSULE BY MOUTH EVERY NIGHT AT BEDTIME FOR NIGHTMARES Patient taking differently: TAKE 1 CAPSULE BY MOUTH EVERY NIGHT AT BEDTIME AS NEEDED FOR NIGHTMARES 04/21/15   Tresa Garter, MD  promethazine (PHENERGAN) 25 MG  tablet Take 1 tablet (25 mg total) by mouth every 6 (six) hours as needed for nausea or vomiting. 02/21/15   Jola Schmidt, MD  valsartan (DIOVAN) 160 MG tablet Take 1 tablet (160 mg total) by mouth daily. Patient not taking: Reported on 02/21/2015 01/07/15   Tanda Rockers, MD    Past Medical History  Diagnosis Date  . Diabetes mellitus without complication ([place])   . PE (pulmonary embolism)   . Hypercholesterolemia   . Hypertension   . COPD (chronic obstructive pulmonary disease) ([place])   . Asthma   . Coronary artery disease   . CHF (congestive heart failure) ([place])   . Mental disorder     BIPOLAR  . Shortness of breath   . GERD (gastroesophageal reflux disease)   . Neuromuscular disorder ([place])     DIABETIC NEUROPATHY  . Ischemic cardiomyopathy   . Bipolar disorder (  [place])   . Post-traumatic stress syndrome   . On home oxygen therapy     "2L most all the time" (02/06/2014)  . MI (myocardial infarction) ([place])   . PTSD (post-traumatic stress disorder)   . Depression   . Heart failure ([place])   . Bipolar disorder [facility])     Past Surgical History  Procedure Laterality Date  . Coronary angioplasty with stent placement      CAD in 2006 x 2 and 2009 2 more- place din REx in [place]  . Esophagogastroduodenoscopy (egd) with propofol N/A 08/03/2013    Procedure: ESOPHAGOGASTRODUODENOSCOPY (EGD) WITH PROPOFOL;  Surgeon: Jeryl Columbia, MD;  Location: [facility] ENDOSCOPY;  Service: Endoscopy;  Laterality: N/A;  . Colonoscopy with propofol N/A 08/03/2013    Procedure: COLONOSCOPY WITH PROPOFOL;  Surgeon: Jeryl Columbia, MD;  Location: [facility] ENDOSCOPY;  Service: Endoscopy;  Laterality: N/A;  . Coronary angioplasty with stent placement  10/07/2013    Xience Alpine DES 2.75  mm x 15  mm  . Left heart catheterization with coronary angiogram N/A 10/07/2013    Procedure: LEFT HEART CATHETERIZATION WITH CORONARY ANGIOGRAM;  Surgeon: Leonie Man, MD;  Location: [facility] CATH LAB;  Service: Cardiovascular;   Laterality: N/A;    Family history: family history includes Asthma in her paternal aunt; Breast cancer in her paternal aunt; Colon cancer in her father; Diabetes in her father; Emphysema in her brother; Heart attack in her father; Hypertension in her mother; Lung disease in her brother; Sarcoidosis in her other; Stroke in her mother.  Social History: Patient lives with her boyfriend.  She is from [facility]  She is out of work.  She walks with a cane.  She smokes occasionally.       Physical Exam: BP 126/67 mmHg  Pulse 100  Temp(Src) 97.5 F (36.4 C) (Oral)  Resp 22  Ht 5\' 7"  (1.702 m)  Wt 120.657 kg (266 lb)  BMI 41.65 kg/m2  SpO2 99%  LMP 05/30/2013 General appearance: Obese adult female, alert and in no acute distress, wheezy.   Eyes: Anicteric, conjunctiva pink, lids and lashes normal.     ENT: No nasal deformity, discharge, or epistaxis.  OP moist without lesions.   Skin: Warm and dry.   Cardiac: Tachycardic, nl [id number], systolic murmur, gallop? appreciated.  Capillary refill is brisk.  JVP not visible due to habitus.  No LE edema.   Respiratory: Tachypnea, diffuse wheezing with inspiration and expiration. Abdomen: Abdomen soft without rigidity.  No TTP. No ascites, distension.   Neuro: Sensorium intact and responding to questions, attention normal.  Speech is fluent.  Moves all extremities equally and with normal coordination.    Psych: Behavior appropriate.  Affect flat.  No evidence of aural or visual hallucinations or delusions.       Labs on Admission:  The metabolic panel shows normal electrolytes and renal function.  Bicarbonate high normal. UA clear. TNI normal. The complete blood count shows no leukocytosis.  Chronic microcytic anemia.   Radiological Exams on Admission: Personally reviewed: Dg Chest 2 View  05/20/2015  CLINICAL DATA:  Shortness of breath today. EXAM: CHEST  2 VIEW COMPARISON:  Chest x-ray 01/07/2015 and prior CT scan 03/04/2014. FINDINGS:  The heart is mildly enlarged but stable. Prominent mediastinal contours are again demonstrated and likely due to borderline adenopathy seen on prior CT scans. Chronic lung disease with pulmonary fibrosis and scarring changes. There is also chronic left pleural thickening and calcifications. I  do not see any definite acute overlying pulmonary process. The bony thorax is intact. IMPRESSION: Chronic lung disease without definite acute overlying pulmonary process. Stable prominent mediastinal contours likely due to borderline adenopathy seen on prior CT scans. Does this patient have sarcoidosis? Electronically Signed   By: Marijo Sanes M.D.   On: 05/20/2015 18:29    EKG: Independently reviewed. Sinus rhythm, rate 93, PVCs present.  No ST changes.    Assessment/Plan 1. COPD exacerbation:  This is new.  Stable on nasal cannula.   No mucus, will hold antibiotics. -Scheduled nebulized albuterol with PRN every 2 hours -Prednisone 40 mg daily -COPD Gold protocol for Pulm follow up -Consult to social work to obtain nebulizer for home -Codeine for cough -Flu shot   2. Anemia, microcytic:  This is new.   -Check ferritin -Iron supplement -Follow up with PCP re: colonoscopy   3. Smoking:  >3 minutes were spent on smoking cessation counseling.    4. IDDM:  Normal sugar at admission. -Continue home Levemir -Sliding scale corrections with meals  5. Bipolar disorder:  Stable.  -Continue home long-acting injectable antipsychotic  6. Chronic systolic and diastolic CHF and ischemic CM:  Stable. Euvolemic on exam. -Continue home spironolactone and aspirin -Restart pravastatin  -Continue home prasugrel -Restart carvedilol and continue lisinopril (these were stopped by Pulm, but until patient is able to obtain alternative agents by prior authorization, it seems the benefit of ACE and BB for her HFrEF outweighs the risk)  7. PTSD: Continue home prazosin PRN    DVT PPx: Lovenox Diet: Heart  healthy Consultants: Care mgmt and social work Code Status: Full Family Communication: None  Medical decision making: What exists of the patient's previous chart was reviewed in depth and the case was discussed with Etta Quill, PA-C. Patient seen 11:32 PM on 05/20/2015.  Disposition Plan:  Admit for COPD exacerbation and GOLD protocol.  Anticipate hospitalization for 2-3 days.      Edwin Dada [facility] Pager [phone]

## 2015-05-21 NOTE — Progress Notes (Deleted)
Written he wrong information in the flow chart

## 2015-05-21 NOTE — Progress Notes (Signed)
Rx Brief Lovenox note  Wt=120 kg CrCl~106 ml/min  Rx adjusted Lovenox to 60mg  (~0.5mg /kg) daily in pt with BMI>30 for DVT prophylaxis  Thanks Dorrene German 05/21/2015 12:55 AM

## 2015-05-21 NOTE — Progress Notes (Signed)
Initial Nutrition Assessment  DOCUMENTATION CODES:   Obesity unspecified  INTERVENTION:  -Boost Breeze po TID, each supplement provides 250 kcal and 9 grams of protein  NUTRITION DIAGNOSIS:   Inadequate oral intake related to other (see comment) (SOB) as evidenced by per patient/family report.  GOAL:   Patient will meet greater than or equal to 90% of their needs  MONITOR:   PO intake, Supplement acceptance, Labs, I & O's  REASON FOR ASSESSMENT:   Malnutrition Screening Tool    ASSESSMENT:   Saranda Kizewski is a 54 y.o. female with a past medical history significant for IDDM, chronic systolic and diastolic CHF from ischemic CM last EF 40%, COPD on home O2 intermittently, chronic PE not on warfarin, IDDM, and Bipolar who presents with 1 week worsening dyspnea and wheezing.  Spoke with pt at bedside. Pt was tired, slow to answer questions.  Pt reports her appetite comes and goes.  She also reports 35# weight loss over a few months, but that she was trying to lose weight.  According to chart, pt has 26#10% insignificant wt loss in 1 year.   She does report some shortness of breath with meals. If she starts to feel tired, she said she just stops eating. Pt lost nebulizer her nebulizer and is running out of oxygen and can't get it renewed. Encouraged pt to eat slowly and take small bites if shortness of breath occurs.  Nutrition-Focused physical exam completed. Findings are no fat depletion,  no muscle depletion, and no edema.   Labs and Medications reviewed.    Diet Order:  Diet heart healthy/carb modified Room service appropriate?: Yes; Fluid consistency:: Thin  Skin:  Reviewed, no issues  Last BM:  05/21/2015  Height:   Ht Readings from Last 1 Encounters:  05/20/15 5\' 7"  (1.702 m)    Weight:   Wt Readings from Last 1 Encounters:  05/20/15 266 lb (120.657 kg)    Ideal Body Weight:  61.36 kg  BMI:  Body mass index is 41.65 kg/(m^2).  Estimated Nutritional  Needs:   Kcal:  1500-1800 calories  Protein:  110-120 grams  Fluid:  1.2L  EDUCATION NEEDS:   No education needs identified at this time  Satira Anis. Margerie Fraiser, MS, RD LDN After Hours/Weekend Pager [phone]

## 2015-05-21 NOTE — Care Management Note (Signed)
Case Management Note  Patient Details  Name: Tamara Silva MRN: [id number] Date of Birth: 04-09-1961  Subjective/Objective: 54 y/o f admitted w/COPD exac. From home, home 02-[employer]-patient will need to be reminded to have home 02 travel tank to be brought in by family @ d/c.                   Action/Plan:dc plan home.   Expected Discharge Date:                  Expected Discharge Plan:  Home/Self Care  In-House Referral:     Discharge planning Services  CM Consult  Post Acute Care Choice:  Durable Medical Equipment ([place]) Choice offered to:     DME Arranged:    DME Agency:     HH Arranged:    HH Agency:     Status of Service:  In process, will continue to follow  Medicare Important Message Given:    Date Medicare IM Given:    Medicare IM give by:    Date Additional Medicare IM Given:    Additional Medicare Important Message give by:     If discussed at [place] of Stay Meetings, dates discussed:    Additional Comments:  Dessa Phi, RN 05/21/2015, 2:30 PM

## 2015-05-21 NOTE — ED Notes (Signed)
Attempted report to 5E. RN unavailable.

## 2015-05-21 NOTE — Progress Notes (Signed)
Inpatient Diabetes Program Recommendations  AACE/ADA: New Consensus Statement on Inpatient Glycemic Control (2015)  Target Ranges:  Prepandial:   less than 140 mg/dL      Peak postprandial:   less than 180 mg/dL (1-2 hours)      Critically ill patients:  140 - 180 mg/dL   Review of Glycemic Control  Results for KAMAIYAH, PICCIRILLI (MRN [id number]) as of 05/21/2015 10:00  Ref. Range 05/21/2015 01:07 05/21/2015 07:40  Glucose-Capillary Latest Ref Range: 65-99 mg/dL 339 (H) 297 (H)    Inpatient Diabetes Program Recommendations:    While ordered steroids, please consider ordering Novolog 5 units TID with meals for meal coverage. Updated HgbA1C to assess glycemic control prior to hospitalization.  Will follow. Thank you. Lorenda Peck, RD, LDN, CDE Inpatient Diabetes Coordinator [phone]

## 2015-05-21 NOTE — Progress Notes (Addendum)
Patient ID: Tamara Silva, female   DOB: 1961/01/05, 54 y.o.   MRN: [id number] [facility] PROGRESS NOTE  Tamara Silva [id number] DOB: Sep 17, 1960 DOA: 05/20/2015 PCP: Angelica Chessman, MD  Brief narrative:    54 y.o. female with a past medical history significant for IDDM, chronic systolic and diastolic CHF from ischemic CM last EF 40%, COPD on home O2 intermittently, chronic PE (on asa and effient), Bipolar disease who presents with worsening shortness of breath for past 1 week PTA associated with cough and wheezing, no fever or chills.  In the ED, the patient was wheezing, tachypneic and required 4L [place] to maintain O2 saturation >90%. CXR demonstrated chronic lung disease without acute pulmonary process. She was given solumedrol in ED and neb treatments but continued to have hypoxia and shortness of breath. She was admitted for COPD exacerbation.  Assessment/Plan:    Principal Problem:   Acute respiratory failure with hypoxia (HCC) / COPD exacerbation (HCC) / COPD GOLD III - Likely COPD exacerbation - Stable resp status - Continue duoneb every 4 hours scheduled and every 4 hours as needed for shortness of breath or wheezing  - Continue prednisone 40 mg daily - Continue oxygen support via [place] to keep O2 sats above 90%  Active Problems:   Chronic pulmonary embolism (HCC) / Chronic anticoagulation - Continue aspirin and prasugrel    Bipolar disorder (HCC) - Stable    Essential hypertension - Continue carvedilol but hold lisinopril and aldactone due to hyperkalemia    Chronic combined systolic and diastolic congestive heart failure, NYHA class 2; EF 30-35% - 2 D ECHO in 07/2014 with EF 40% - Compensated - Continue aspirin, effient - Continue coreg    CAD S/P percutaneous coronary angioplasty - prior PCI to LAD; RCA PCI: new Xience Alpine DES 2.75 mm x 15 mm  - Stable    CKD (chronic kidney disease), stage II - Baseline Cr a year ago was 1.16 - Cr on this admission  WNL    Hyperkalemia - Secondary to aldactone which were placed on hold     Anemia of chronic kidney failure, stage 2 - Hemoglobins table - On iron supplements     Dyslipidemia associated with type 2 diabetes mellitus (HCC) - Continue Pravachol     Controlled diabetes mellitus with diabetic nephropathy, with long-term current use of insulin (HCC) - No recent A1c but 6 months ago was WNL - Continue current insulin regimen 42 units at bedtime and add 5 units TID per DM coordinator - Continue SSI    Morbid obesity due to excess calories ([place]) - Counseled on nutrition, diet   DVT Prophylaxis  - Lovenox subQ ordered   Code Status: Full.  Family Communication:  plan of care discussed with the patient Disposition Plan: Home likely by 12/23.  IV access:  Peripheral IV  Procedures and diagnostic studies:    Dg Chest 2 View 05/20/2015  Chronic lung disease without definite acute overlying pulmonary process. Stable prominent mediastinal contours likely due to borderline adenopathy seen on prior CT scans. Does this patient have sarcoidosis? Electronically Signed   By: Marijo Sanes M.D.   On: 05/20/2015 18:29    Medical Consultants:  None   Other Consultants:  None   IAnti-Infectives:   None    Leisa Lenz, MD  [facility] Pager [phone]  Time spent in minutes: 25 minutes  If 7PM-7AM, please contact night-coverage [email or web address] Password [email or web address] 05/21/2015, 1:04 PM   LOS: 1 day    HPI/Subjective: No acute  overnight events. Patient reports feeling tired.  Objective: Filed Vitals:   05/21/15 0139 05/21/15 0556 05/21/15 0610 05/21/15 0939  BP:  137/85    Pulse: 91 87    Temp:  98.4 F (36.9 C)    TempSrc:  Oral    Resp: 24 20    Height:      Weight:      SpO2: 98% 98% 98% 97%    Intake/Output Summary (Last 24 hours) at 05/21/15 1304 Last data filed at 05/21/15 0900  Gross per 24 hour  Intake    240 ml  Output      0 ml  Net    240 ml     Exam:   General:  Pt is alert, follows commands appropriately, not in acute distress  Cardiovascular: Regular rate and rhythm, S1/S2, no murmurs  Respiratory: wheezing upper lung lobes, no rhonchi   Abdomen: Soft, obese, non tender, non distended, bowel sounds present  Extremities: No edema, pulses DP and PT palpable bilaterally  Neuro: Grossly nonfocal  Data Reviewed: Basic Metabolic Panel:  Recent Labs Lab 05/20/15 1739 05/21/15 0526  NA 141 137  K 4.1 5.6*  CL 101 97*  CO2 31 32  GLUCOSE 181* 364*  BUN 8 12  CREATININE 0.68 0.97  CALCIUM 8.6* 8.7*   Liver Function Tests:  Recent Labs Lab 05/20/15 1739  AST 16  ALT 21  ALKPHOS 60  BILITOT 0.2*  PROT 7.7  ALBUMIN 3.3*   No results for input(s): LIPASE, AMYLASE in the last 168 hours. No results for input(s): AMMONIA in the last 168 hours. CBC:  Recent Labs Lab 05/20/15 1739 05/21/15 0526  WBC 6.1 7.3  NEUTROABS 3.9  --   HGB 9.4* 8.8*  HCT 32.8* 31.7*  MCV 75.4* 76.8*  PLT 313 321   Cardiac Enzymes: No results for input(s): CKTOTAL, CKMB, CKMBINDEX, TROPONINI in the last 168 hours. BNP: Invalid input(s): POCBNP CBG:  Recent Labs Lab 05/21/15 0107 05/21/15 0740 05/21/15 1207  GLUCAP 339* 297* 262*    No results found for this or any previous visit (from the past 240 hour(s)).   Scheduled Meds: . aspirin  81 mg Oral Daily  . carvedilol  12.5 mg Oral BID WC  . enoxaparin (LOVENOX) injection  60 mg Subcutaneous Daily  . feeding supplement  1 Container Oral TID BM  . ferrous sulfate  325 mg Oral BID WC  . fluticasone  2 spray Each Nare BID  . [START ON 05/22/2015] Influenza vac split quadrivalent PF  0.5 mL Intramuscular Tomorrow-1000  . insulin aspart  0-15 Units Subcutaneous TID WC  . insulin aspart  0-5 Units Subcutaneous QHS  . insulin detemir  42 Units Subcutaneous QHS  . ipratropium-albuterol  3 mL Nebulization Q4H  . lisinopril  10 mg Oral Daily  . pantoprazole  40 mg Oral  Daily  . prasugrel  10 mg Oral Daily  . pravastatin  80 mg Oral QPM  . predniSONE  40 mg Oral Q breakfast   Continuous Infusions:

## 2015-05-22 ENCOUNTER — Inpatient Hospital Stay (HOSPITAL_COMMUNITY): Payer: Medicaid Other

## 2015-05-22 LAB — GLUCOSE, CAPILLARY
GLUCOSE-CAPILLARY: 143 mg/dL — AB (ref 65–99)
Glucose-Capillary: 111 mg/dL — ABNORMAL HIGH (ref 65–99)
Glucose-Capillary: 269 mg/dL — ABNORMAL HIGH (ref 65–99)
Glucose-Capillary: 238 mg/dL — ABNORMAL HIGH (ref 65–99)

## 2015-05-22 LAB — HEMOGLOBIN A1C
Hgb A1c MFr Bld: 7.9 % — ABNORMAL HIGH (ref 4.8–5.6)
Mean Plasma Glucose: 180 mg/dL

## 2015-05-22 MED ORDER — INSULIN ASPART 100 UNIT/ML ~~LOC~~ SOLN
5.0000 [IU] | Freq: Three times a day (TID) | SUBCUTANEOUS | Status: DC
  Administered 2015-05-22 – 2015-05-23 (×4): 5 [IU] via SUBCUTANEOUS

## 2015-05-22 MED ORDER — METHYLPREDNISOLONE SODIUM SUCC 125 MG IJ SOLR
60.0000 mg | Freq: Every day | INTRAMUSCULAR | Status: DC
  Administered 2015-05-22: 60 mg via INTRAVENOUS
  Filled 2015-05-22 (×2): qty 0.96

## 2015-05-22 MED ORDER — BUDESONIDE 0.25 MG/2ML IN SUSP
0.2500 mg | Freq: Two times a day (BID) | RESPIRATORY_TRACT | Status: DC
  Administered 2015-05-22 – 2015-05-26 (×9): 0.25 mg via RESPIRATORY_TRACT
  Filled 2015-05-22 (×8): qty 2

## 2015-05-22 MED ORDER — ARFORMOTEROL TARTRATE 15 MCG/2ML IN NEBU
15.0000 ug | INHALATION_SOLUTION | Freq: Two times a day (BID) | RESPIRATORY_TRACT | Status: DC
  Administered 2015-05-22 – 2015-05-26 (×9): 15 ug via RESPIRATORY_TRACT
  Filled 2015-05-22 (×11): qty 2

## 2015-05-22 MED ORDER — METHYLPREDNISOLONE SODIUM SUCC 125 MG IJ SOLR
80.0000 mg | Freq: Two times a day (BID) | INTRAMUSCULAR | Status: DC
  Administered 2015-05-22 – 2015-05-24 (×4): 80 mg via INTRAVENOUS
  Filled 2015-05-22 (×5): qty 1.28

## 2015-05-22 NOTE — Progress Notes (Addendum)
Patient ID: Tamara Silva, female   DOB: 09/25/1960, 54 y.o.   MRN: [id number] [facility] PROGRESS NOTE  Tamara Silva [id number] DOB: Oct 14, 1960 DOA: 05/20/2015 PCP: Angelica Chessman, MD  Brief narrative:    54 y.o. female with a past medical history significant for IDDM, chronic systolic and diastolic CHF from ischemic CM last EF 40%, COPD on home O2 intermittently, chronic PE (on asa and effient), Bipolar disease who presents with worsening shortness of breath for past 1 week PTA associated with cough and wheezing. On admission, the patient was wheezing, tachypneic and required 4L [place] to maintain O2 saturation >90%. CXR demonstrated chronic lung disease without acute pulmonary process. She was given solumedrol in ED and neb treatments but continued to have hypoxia and shortness of breath. She was admitted for COPD exacerbation.  Assessment/Plan:    Principal Problem:   Acute respiratory failure with hypoxia (HCC) / COPD exacerbation ([place]) / COPD GOLD III - Hypoxia likely from COPD exacerbation - Continue current duoneb scheduled and as needed - Added brovana and Pulmicort sch BID nebulizer - Start solumedrol 80 mg Q 12 hours IV - Continue oxygen support via [place] to keep O2 sats above 90%  Active Problems:   Chronic pulmonary embolism (HCC) / Chronic anticoagulation - Continue aspirin and prasugrel    Bipolar disorder (HCC) - Stable    Essential hypertension - Continue carvedilol    Chronic combined systolic and diastolic congestive heart failure, NYHA class 2; EF 30-35% /  CAD S/P percutaneous coronary angioplasty - prior PCI to LAD; RCA PCI: new Xience Alpine DES 2.75 mm x 15 mm  - 2 D ECHO in 07/2014 with EF 40% - Continue aspirin, effient - Lasix on hold since BP controlled with coreg only  - Continue coreg - aldactone were placed on hold due to hyperkalemia.     CKD (chronic kidney disease), stage II - Baseline Cr a year ago was 1.16 - Cr on admission at  baseline     Hyperkalemia - Due to aldactone which were placed on hold     Anemia of chronic kidney failure, stage 2 - On iron supplements     Dyslipidemia associated with type 2 diabetes mellitus (HCC) - Continue Pravachol     Controlled diabetes mellitus with diabetic nephropathy, with long-term current use of insulin (HCC) - A1c pending  - Continue Lantus to 42 units at bedtime and novolog 5 units TID  - Continue sliding scale insulin    Morbid obesity due to excess calories ([place]) - Seen by dietitian   DVT Prophylaxis  - Lovenox subQ in hospital   Code Status: Full.  Family Communication:  plan of care discussed with the patient Disposition Plan: D/C by 05/25/2015.  IV access:  Peripheral IV  Procedures and diagnostic studies:    Dg Chest 2 View 05/20/2015  Chronic lung disease without definite acute overlying pulmonary process. Stable prominent mediastinal contours likely due to borderline adenopathy seen on prior CT scans. Does this patient have sarcoidosis? Electronically Signed   By: Marijo Sanes M.D.   On: 05/20/2015 18:29   Medical Consultants:  None   Other Consultants:  None   IAnti-Infectives:   None    Leisa Lenz, MD  [facility] Pager [phone]  Time spent in minutes: 25 minutes  If 7PM-7AM, please contact night-coverage [email or web address] Password [facility] 05/22/2015, 9:45 AM   LOS: 2 days    HPI/Subjective: No acute overnight events. Patient wheezing.   Objective: Filed Vitals:   05/21/15  1735 05/21/15 2114 05/21/15 2116 05/22/15 0519  BP: 144/73  142/79 125/65  Pulse: 75  78 69  Temp:   98.1 F (36.7 C) 98.3 F (36.8 C)  TempSrc:   Oral Oral  Resp:   22 22  Height:      Weight:      SpO2:  93% 100% 99%    Intake/Output Summary (Last 24 hours) at 05/22/15 0945 Last data filed at 05/21/15 1700  Gross per 24 hour  Intake    480 ml  Output      0 ml  Net    480 ml    Exam:   General:  Pt is not in acute  distress  Cardiovascular: rate controlled, S1, S2 (+)  Respiratory: wheezing (+), no rhonchi  Abdomen: (+) BS, non tender   Extremities: No swelling, palpable pulses   Neuro: Nonfocal  Data Reviewed: Basic Metabolic Panel:  Recent Labs Lab 05/20/15 1739 05/21/15 0526  NA 141 137  K 4.1 5.6*  CL 101 97*  CO2 31 32  GLUCOSE 181* 364*  BUN 8 12  CREATININE 0.68 0.97  CALCIUM 8.6* 8.7*   Liver Function Tests:  Recent Labs Lab 05/20/15 1739  AST 16  ALT 21  ALKPHOS 60  BILITOT 0.2*  PROT 7.7  ALBUMIN 3.3*   No results for input(s): LIPASE, AMYLASE in the last 168 hours. No results for input(s): AMMONIA in the last 168 hours. CBC:  Recent Labs Lab 05/20/15 1739 05/21/15 0526  WBC 6.1 7.3  NEUTROABS 3.9  --   HGB 9.4* 8.8*  HCT 32.8* 31.7*  MCV 75.4* 76.8*  PLT 313 321   Cardiac Enzymes: No results for input(s): CKTOTAL, CKMB, CKMBINDEX, TROPONINI in the last 168 hours. BNP: Invalid input(s): POCBNP CBG:  Recent Labs Lab 05/21/15 0740 05/21/15 1207 05/21/15 1716 05/21/15 2136 05/22/15 0741  GLUCAP 297* 262* 146* 216* 111*    No results found for this or any previous visit (from the past 240 hour(s)).   Scheduled Meds: . arformoterol  15 mcg Nebulization BID  . aspirin  81 mg Oral Daily  . budesonide (PULMICORT) nebulizer solution  0.25 mg Nebulization BID  . carvedilol  12.5 mg Oral BID WC  . enoxaparin (LOVENOX) injection  60 mg Subcutaneous Daily  . feeding supplement  1 Container Oral TID BM  . ferrous sulfate  325 mg Oral BID WC  . fluticasone  2 spray Each Nare BID  . Influenza vac split quadrivalent PF  0.5 mL Intramuscular Tomorrow-1000  . insulin aspart  0-15 Units Subcutaneous TID WC  . insulin aspart  0-5 Units Subcutaneous QHS  . insulin detemir  42 Units Subcutaneous QHS  . ipratropium-albuterol  3 mL Nebulization Q4H  . methylPREDNISolone (SOLU-MEDROL) injection  80 mg Intravenous Q12H  . pantoprazole  40 mg Oral Daily  .  prasugrel  10 mg Oral Daily  . pravastatin  80 mg Oral QPM   Continuous Infusions:

## 2015-05-22 NOTE — Progress Notes (Signed)
  CSW received consult for COPD Gold Protocol, though patient does not meet qualifications (only 1 admission within the past 6 months).   CSW signing off.    Pete Pelt [facility]  [phone]

## 2015-05-23 DIAGNOSIS — E1165 Type 2 diabetes mellitus with hyperglycemia: Secondary | ICD-10-CM

## 2015-05-23 DIAGNOSIS — Z794 Long term (current) use of insulin: Secondary | ICD-10-CM

## 2015-05-23 DIAGNOSIS — IMO0001 Reserved for inherently not codable concepts without codable children: Secondary | ICD-10-CM | POA: Diagnosis present

## 2015-05-23 LAB — BASIC METABOLIC PANEL
ANION GAP: 6 (ref 5–15)
BUN: 30 mg/dL — ABNORMAL HIGH (ref 6–20)
CALCIUM: 8.9 mg/dL (ref 8.9–10.3)
CO2: 37 mmol/L — ABNORMAL HIGH (ref 22–32)
Chloride: 97 mmol/L — ABNORMAL LOW (ref 101–111)
Creatinine, Ser: 0.92 mg/dL (ref 0.44–1.00)
GFR calc Af Amer: >60 mL/min (ref >60)
Glucose, Bld: 185 mg/dL — ABNORMAL HIGH (ref 65–99)
Potassium: 4.8 mmol/L (ref 3.5–5.1)
SODIUM: 140 mmol/L (ref 135–145)

## 2015-05-23 LAB — GLUCOSE, CAPILLARY
GLUCOSE-CAPILLARY: 299 mg/dL — AB (ref 65–99)
GLUCOSE-CAPILLARY: 184 mg/dL — AB (ref 65–99)
GLUCOSE-CAPILLARY: 223 mg/dL — AB (ref 65–99)
Glucose-Capillary: 266 mg/dL — ABNORMAL HIGH (ref 65–99)

## 2015-05-23 MED ORDER — INSULIN ASPART 100 UNIT/ML ~~LOC~~ SOLN
8.0000 [IU] | Freq: Three times a day (TID) | SUBCUTANEOUS | Status: DC
Start: 2015-05-23 — End: 2015-05-26
  Administered 2015-05-23 – 2015-05-26 (×8): 8 [IU] via SUBCUTANEOUS

## 2015-05-23 MED ORDER — INSULIN DETEMIR 100 UNIT/ML ~~LOC~~ SOLN
45.0000 [IU] | Freq: Every day | SUBCUTANEOUS | Status: DC
  Administered 2015-05-23 – 2015-05-25 (×3): 45 [IU] via SUBCUTANEOUS
  Filled 2015-05-23 (×3): qty 0.45

## 2015-05-23 MED ORDER — LEVOFLOXACIN IN D5W 750 MG/150ML IV SOLN
750.0000 mg | INTRAVENOUS | Status: DC
  Administered 2015-05-23 – 2015-05-24 (×2): 750 mg via INTRAVENOUS
  Filled 2015-05-23 (×2): qty 150

## 2015-05-23 MED ORDER — FUROSEMIDE 10 MG/ML IJ SOLN
40.0000 mg | Freq: Once | INTRAMUSCULAR | Status: AC
  Administered 2015-05-23: 40 mg via INTRAVENOUS
  Filled 2015-05-23: qty 4

## 2015-05-23 NOTE — Progress Notes (Signed)
ANTIBIOTIC CONSULT NOTE - INITIAL  Pharmacy Consult for Levaquin  Indication: pneumonia  Allergies  Allergen Reactions  . Metolazone Other (See Comments)    Dizziness and falling  . Plavix [Clopidogrel Bisulfate] Other (See Comments)    High PRU's - non-responder  . Nsaids Other (See Comments)    "I do not take NSAIDs d/t interfering with other meds"    Patient Measurements: Height: 5\' 7"  (170.2 cm) Weight: 266 lb (120.657 kg) IBW/kg (Calculated) : 61.6   Vital Signs: Temp: 97.8 F (36.6 C) (12/23 0505) Temp Source: Oral (12/23 0505) BP: 121/63 mmHg (12/23 0505) Pulse Rate: 64 (12/23 0505) Intake/Output from previous day: 12/22 0701 - 12/23 0700 In: 240 [P.O.:240] Out: -  Intake/Output from this shift: Total I/O In: 240 [P.O.:240] Out: -   Labs:  Recent Labs  05/20/15 1739 05/21/15 0526  WBC 6.1 7.3  HGB 9.4* 8.8*  PLT 313 321  CREATININE 0.68 0.97   Estimated Creatinine Clearance: 88.1 mL/min (by C-G formula based on Cr of 0.97). No results for input(s): VANCOTROUGH, VANCOPEAK, VANCORANDOM, GENTTROUGH, GENTPEAK, GENTRANDOM, TOBRATROUGH, TOBRAPEAK, TOBRARND, AMIKACINPEAK, AMIKACINTROU, AMIKACIN in the last 72 hours.   Microbiology: No results found for this or any previous visit (from the past 720 hour(s)).  Medical History: History reviewed. No pertinent past medical history.   Assessment: 72 yoF admitted 12/21 with hypoxia likely from COPD exacerbation, initiated on prednisone and breathing treatments.  Patient continues to have wheezing.  Steroids changed to IV and Levaquin per pharmacy ordered for r/o pneumonia, AECOPD.  CrCl~88 ml/min.  WBC wnl and afebrile.  Goal of Therapy:  Doses adjusted per renal function Eradication of infection  Plan:  Levaquin 750 mg IV q24h.  F/u for clinical improvement per MD evaluation and will transition to PO Levaquin.   Hershal Coria 05/23/2015,9:51 AM

## 2015-05-23 NOTE — Progress Notes (Addendum)
Patient ID: Tamara Silva, female   DOB: 1960-06-07, 54 y.o.   MRN: [id number] [facility] PROGRESS NOTE  Tamara Silva [id number] DOB: Apr 14, 1961 DOA: 05/20/2015 PCP: Angelica Chessman, MD  Brief narrative:    54 y.o. female with a past medical history significant for IDDM, chronic systolic and diastolic CHF from ischemic CM last EF 40%, COPD on home O2 intermittently, chronic PE (on asa and effient), Bipolar disease who presents with worsening shortness of breath for past 1 week PTA associated with cough and wheezing, no fever or chills.  In the ED, the patient was wheezing, tachypneic and required 4L [place] to maintain O2 saturation >90%. CXR demonstrated chronic lung disease without acute pulmonary process. She was given solumedrol in ED and neb treatments but continued to have hypoxia and shortness of breath. She was admitted for COPD exacerbation.  Barrier to discharge: likely by 12/25.  Assessment/Plan:    Principal Problem:   Acute respiratory failure with hypoxia (HCC) / COPD exacerbation (HCC) / COPD GOLD III - Hypoxia likely from COPD exacerbation - Still wheezing and some rhonchi - Add Levaquin - Continue current duoneb scheduled and as needed - Continue brovana and Pulmicort sch BID nebulizer - Continue solumedrol 80 mg Q 12 hours IV - Continue oxygen support via [place] to keep O2 sats above 90%  Active Problems:   Chronic pulmonary embolism (HCC) / Chronic anticoagulation - We will continue aspirin and prasugrel    Bipolar disorder (HCC) - Stable    Essential hypertension - Continue carvedilol and give 1 dose lasix 40 mg IV    Chronic combined systolic and diastolic congestive heart failure, NYHA class 2; EF 30-35% /  CAD S/P percutaneous coronary angioplasty - prior PCI to LAD; RCA PCI: new Xience Alpine DES 2.75 mm x 15 mm  - 2 D ECHO in 07/2014 with EF 40% - Continue aspirin, effient - Add one dose lasix 40 mg IV since BP 121/63. If BP remains at goal we  will resume lasix home dose. Since her BP was on soft side since admission lasix was on hold.  - Continue coreg    CKD (chronic kidney disease), stage II - Baseline Cr a year ago was 1.16 - Cr WNL    Hyperkalemia - Due to aldactone which were placed on hold  - Repeat BMP today     Anemia of chronic kidney failure, stage 2 - Continue iron supplements     Dyslipidemia associated with type 2 diabetes mellitus ([place]) - Continue Pravachol     Controlled diabetes mellitus with diabetic nephropathy, with long-term current use of insulin (HCC) - A1c on this admission 7.9 - Per DM coordinator we increased Lantus to 45 units at bedtime and novolog to 8 units TID  - Continue sliding scale insulin    Morbid obesity due to excess calories ([place]) - Seen by dietitian  - Continue nutritional supplementation   DVT Prophylaxis  - Lovenox subQ   Code Status: Full.  Family Communication:  plan of care discussed with the patient Disposition Plan: D/C by 05/25/2015.  IV access:  Peripheral IV  Procedures and diagnostic studies:    Dg Chest 2 View 05/20/2015  Chronic lung disease without definite acute overlying pulmonary process. Stable prominent mediastinal contours likely due to borderline adenopathy seen on prior CT scans. Does this patient have sarcoidosis? Electronically Signed   By: Marijo Sanes M.D.   On: 05/20/2015 18:29   Medical Consultants:  None   Other Consultants:  None  IAnti-Infectives:   Levaquin 05/23/2015 -->   Leisa Lenz, MD  [facility] Pager [phone]  Time spent in minutes: 25 minutes  If 7PM-7AM, please contact night-coverage [email or web address] Password [facility] 05/23/2015, 3:59 PM   LOS: 3 days    HPI/Subjective: No acute overnight events. Patient feels better but still wheezing.   Objective: Filed Vitals:   05/23/15 0748 05/23/15 1230 05/23/15 1512 05/23/15 1531  BP:   121/95   Pulse:   49   Temp:   97.7 F (36.5 C)   TempSrc:   Oral    Resp:   18   Height:      Weight:      SpO2: 97% 97% 97% 97%    Intake/Output Summary (Last 24 hours) at 05/23/15 1559 Last data filed at 05/23/15 0934  Gross per 24 hour  Intake    480 ml  Output      0 ml  Net    480 ml    Exam:   General:  Pt is alert, awake, no acute distress  Cardiovascular: rate controlled, appreciate S1, S2   Respiratory: wheezing in upper lung lobes, rhonchi in upper lung lobes  Abdomen: obese, non tender abd, (+) BS  Extremities: No edema, palpable pulses   Neuro: No focal deficits   Data Reviewed: Basic Metabolic Panel:  Recent Labs Lab 05/20/15 1739 05/21/15 0526  NA 141 137  K 4.1 5.6*  CL 101 97*  CO2 31 32  GLUCOSE 181* 364*  BUN 8 12  CREATININE 0.68 0.97  CALCIUM 8.6* 8.7*   Liver Function Tests:  Recent Labs Lab 05/20/15 1739  AST 16  ALT 21  ALKPHOS 60  BILITOT 0.2*  PROT 7.7  ALBUMIN 3.3*   No results for input(s): LIPASE, AMYLASE in the last 168 hours. No results for input(s): AMMONIA in the last 168 hours. CBC:  Recent Labs Lab 05/20/15 1739 05/21/15 0526  WBC 6.1 7.3  NEUTROABS 3.9  --   HGB 9.4* 8.8*  HCT 32.8* 31.7*  MCV 75.4* 76.8*  PLT 313 321   Cardiac Enzymes: No results for input(s): CKTOTAL, CKMB, CKMBINDEX, TROPONINI in the last 168 hours. BNP: Invalid input(s): POCBNP CBG:  Recent Labs Lab 05/22/15 1138 05/22/15 1659 05/22/15 2109 05/23/15 0810 05/23/15 1130  GLUCAP 143* 269* 238* 266* 299*    No results found for this or any previous visit (from the past 240 hour(s)).   Scheduled Meds: . arformoterol  15 mcg Nebulization BID  . aspirin  81 mg Oral Daily  . budesonide (PULMICORT) nebulizer solution  0.25 mg Nebulization BID  . carvedilol  12.5 mg Oral BID WC  . enoxaparin (LOVENOX) injection  60 mg Subcutaneous Daily  . feeding supplement  1 Container Oral TID BM  . ferrous sulfate  325 mg Oral BID WC  . fluticasone  2 spray Each Nare BID  . insulin aspart  0-15 Units  Subcutaneous TID WC  . insulin aspart  0-5 Units Subcutaneous QHS  . insulin aspart  8 Units Subcutaneous TID WC  . insulin detemir  45 Units Subcutaneous QHS  . ipratropium-albuterol  3 mL Nebulization Q4H  . levofloxacin (LEVAQUIN) IV  750 mg Intravenous Q24H  . methylPREDNISolone (SOLU-MEDROL) injection  80 mg Intravenous Q12H  . pantoprazole  40 mg Oral Daily  . prasugrel  10 mg Oral Daily  . pravastatin  80 mg Oral QPM   Continuous Infusions:

## 2015-05-23 NOTE — Progress Notes (Signed)
Inpatient Diabetes Program Recommendations  AACE/ADA: New Consensus Statement on Inpatient Glycemic Control (2015)  Target Ranges:  Prepandial:   less than 140 mg/dL      Peak postprandial:   less than 180 mg/dL (1-2 hours)      Critically ill patients:  140 - 180 mg/dL    Results for Tamara Silva, Tamara Silva (MRN [id number]) as of 05/23/2015 09:55  Ref. Range 05/22/2015 07:41 05/22/2015 11:38 05/22/2015 16:59 05/22/2015 21:09  Glucose-Capillary Latest Ref Range: 65-99 mg/dL 111 (H) 143 (H) 269 (H) 238 (H)    Results for Tamara Silva, Tamara Silva (MRN [id number]) as of 05/23/2015 09:55  Ref. Range 05/23/2015 08:10  Glucose-Capillary Latest Ref Range: 65-99 mg/dL 266 (H)    Home DM Meds: Levemir 42 units QHS       Novolog 15-20 units tidwc  Current Insulin Orders: Levemir 42 units QHS      Novolog Moderate SSI (0-15 units) TID AC + HS      Novolog 5 units tidwc    -Patient currently getting IV Solumedrol 80 mg bid here in the hospital.  -AM CBG today elevated- 266 mg/dl.  -Eating 100% of meals.     MD- Please consider the following in-hospital insulin adjustments while patient getting IV steroids:  1. Increase Levemir to 45 units QHS  2. Increase Novolog Meal Coverage to 8 units tidwc     --Will follow patient during hospitalization--  Wyn Quaker RN, MSN, CDE Diabetes Coordinator Inpatient Glycemic Control Team Team Pager: [phone] (8a-5p)

## 2015-05-24 DIAGNOSIS — J449 Chronic obstructive pulmonary disease, unspecified: Secondary | ICD-10-CM

## 2015-05-24 LAB — GLUCOSE, CAPILLARY
GLUCOSE-CAPILLARY: 260 mg/dL — AB (ref 65–99)
GLUCOSE-CAPILLARY: 295 mg/dL — AB (ref 65–99)
GLUCOSE-CAPILLARY: 202 mg/dL — AB (ref 65–99)
GLUCOSE-CAPILLARY: 231 mg/dL — AB (ref 65–99)

## 2015-05-24 MED ORDER — ALPRAZOLAM 0.5 MG PO TABS
0.5000 mg | ORAL_TABLET | Freq: Two times a day (BID) | ORAL | Status: DC | PRN
  Administered 2015-05-24 – 2015-05-25 (×2): 0.5 mg via ORAL
  Filled 2015-05-24 (×2): qty 1

## 2015-05-24 MED ORDER — METHYLPREDNISOLONE SODIUM SUCC 125 MG IJ SOLR
80.0000 mg | INTRAMUSCULAR | Status: DC
  Administered 2015-05-25: 80 mg via INTRAVENOUS
  Filled 2015-05-24 (×2): qty 1.28

## 2015-05-24 MED ORDER — FUROSEMIDE 40 MG PO TABS
40.0000 mg | ORAL_TABLET | Freq: Two times a day (BID) | ORAL | Status: DC
  Administered 2015-05-24 – 2015-05-26 (×4): 40 mg via ORAL
  Filled 2015-05-24 (×6): qty 1

## 2015-05-24 MED ORDER — LEVOFLOXACIN 750 MG PO TABS
750.0000 mg | ORAL_TABLET | Freq: Every day | ORAL | Status: DC
  Administered 2015-05-25: 750 mg via ORAL
  Filled 2015-05-24 (×2): qty 1

## 2015-05-24 NOTE — Progress Notes (Signed)
PHARMACY NOTE -  Renal dose adjustment  Pharmacy has been assisting with dosing of Levaquin for AECOPD. Dosage remains stable at 750mg  q24h and need for further dosage adjustment appears unlikely at present.    Will change to PO starting tomorrow (see P&T policy)  Will sign off at this time.  Please reconsult if a change in clinical status warrants re-evaluation of dosage.  Netta Cedars, PharmD, BCPS Pager: [phone] 05/24/2015@12 :15 PM  PHARMACIST - PHYSICIAN COMMUNICATION CONCERNING: Antibiotic IV to Oral Route Change Policy  RECOMMENDATION: This patient is receiving Levaquin by the intravenous route.  Based on criteria approved by the Pharmacy and Therapeutics Committee, the antibiotic(s) is/are being converted to the equivalent oral dose form(s).   DESCRIPTION: These criteria include:  Patient being treated for a respiratory tract infection, urinary tract infection, cellulitis or clostridium difficile associated diarrhea if on metronidazole  The patient is not neutropenic and does not exhibit a GI malabsorption state  The patient is eating (either orally or via tube) and/or has been taking other orally administered medications for a least 24 hours  The patient is improving clinically and has a Tmax < 100.5  If you have questions about this conversion, please contact the Pharmacy Department  []   [phone] )  [facility] []   [phone] )  [facility] []   [phone] )  [facility] []   [phone] )  [facility] [x]   [phone] )  [facility]

## 2015-05-24 NOTE — Progress Notes (Addendum)
Patient ID: Tamara Silva, female   DOB: January 06, 1961, 54 y.o.   MRN: [id number] [facility] PROGRESS NOTE  Tamara Silva [id number] DOB: 26-Sep-1960 DOA: 05/20/2015 PCP: Angelica Chessman, MD  Brief narrative:    54 y.o. female with a past medical history significant for IDDM, chronic systolic and diastolic CHF from ischemic CM last EF 40%, COPD on home O2 intermittently, chronic PE (on asa and effient), Bipolar disease who presents with worsening shortness of breath for past 1 week PTA associated with cough and wheezing, no fever or chills.  In the ED, the patient was wheezing, tachypneic and required 4L [place] to maintain O2 saturation >90%. CXR demonstrated chronic lung disease without acute pulmonary process. She was given solumedrol in ED and neb treatments but continued to have hypoxia and shortness of breath. She was admitted for COPD exacerbation.  Barrier to discharge: likely by 12/25.  Assessment/Plan:    Principal Problem:   Acute respiratory failure with hypoxia (HCC) / COPD exacerbation (HCC) / COPD GOLD III - Hypoxia due to COPD and pneumonia - Still with rhonchi and mild wheezing - Continue current nebs - Continue solumedrol but reduce to once a day regimen (80 mg IV) - Continue Levaquin  - Continue oxygen support via [place] to keep O2 sats above 90%  Active Problems:   Chronic pulmonary embolism (HCC) / Chronic anticoagulation - Continue aspirin and prasugrel    Bipolar disorder (HCC) - Stable    Essential hypertension - Continue carvedilol and restart lasix per home regimen     Chronic combined systolic and diastolic congestive heart failure, NYHA class 2; EF 30-35% /  CAD S/P percutaneous coronary angioplasty - prior PCI to LAD; RCA PCI: new Xience Alpine DES 2.75 mm x 15 mm  - 2 D ECHO in 07/2014 with EF 40% - Continue aspirin, effient - Started lasix 40 mg IV once 12/23 - Today, resume lasix per home dose 40 mg BID - Continue coreg - aldactone still on  hold due to good BP control on current meds     CKD (chronic kidney disease), stage II - Baseline Cr a year ago was 1.16 - Cr WNL    Hyperkalemia - Due to aldactone which were placed on hold  - Resolved with lasix    Anemia of chronic kidney failure, stage 2 - Continue iron supplements     Dyslipidemia associated with type 2 diabetes mellitus (HCC) - Continue Pravachol 80 mg daily     Controlled diabetes mellitus with diabetic nephropathy, with long-term current use of insulin (HCC) - A1c on this admission 7.9 - Currently on Lantus to 45 units at bedtime and novolog to 8 units TID and SSI - CBG's in past 24 hours: 223, 260, 295    Morbid obesity due to excess calories (HCC) - Continue nutritional supplementation as recommended by dietician   DVT Prophylaxis  - Lovenox subQ in hospital   Code Status: Full.  Family Communication:  plan of care discussed with the patient Disposition Plan: D/C by 05/25/2015.  IV access:  Peripheral IV  Procedures and diagnostic studies:    Dg Chest 2 View 05/20/2015  Chronic lung disease without definite acute overlying pulmonary process. Stable prominent mediastinal contours likely due to borderline adenopathy seen on prior CT scans. Does this patient have sarcoidosis? Electronically Signed   By: Marijo Sanes M.D.   On: 05/20/2015 18:29   Medical Consultants:  None   Other Consultants:  None   IAnti-Infectives:   Levaquin 05/23/2015 -->  Leisa Lenz, MD  [facility] Pager [phone]  Time spent in minutes: 25 minutes  If 7PM-7AM, please contact night-coverage [email or web address] Password [facility] 05/24/2015, 11:50 AM   LOS: 4 days    HPI/Subjective: No acute overnight events. Patient feels better.  Objective: Filed Vitals:   05/23/15 2333 05/24/15 0415 05/24/15 0628 05/24/15 0849  BP:   126/63   Pulse:   63   Temp:   97.8 F (36.6 C)   TempSrc:   Oral   Resp:   16   Height:      Weight:      SpO2: 98% 98% 100% 99%     Intake/Output Summary (Last 24 hours) at 05/24/15 1150 Last data filed at 05/23/15 1837  Gross per 24 hour  Intake    480 ml  Output      0 ml  Net    480 ml    Exam:   General:  Pt is no acute distress  Cardiovascular: RRR, appreciate S1, S2    Respiratory: still rhonchi in upper lung lobes, mild wheezing   Abdomen: obese, non tender, (+) BS  Extremities: No leg swelling, palpable pulses   Neuro: Nonfocal   Data Reviewed: Basic Metabolic Panel:  Recent Labs Lab 05/20/15 1739 05/21/15 0526 05/23/15 1644  NA 141 137 140  K 4.1 5.6* 4.8  CL 101 97* 97*  CO2 31 32 37*  GLUCOSE 181* 364* 185*  BUN 8 12 30*  CREATININE 0.68 0.97 0.92  CALCIUM 8.6* 8.7* 8.9   Liver Function Tests:  Recent Labs Lab 05/20/15 1739  AST 16  ALT 21  ALKPHOS 60  BILITOT 0.2*  PROT 7.7  ALBUMIN 3.3*   No results for input(s): LIPASE, AMYLASE in the last 168 hours. No results for input(s): AMMONIA in the last 168 hours. CBC:  Recent Labs Lab 05/20/15 1739 05/21/15 0526  WBC 6.1 7.3  NEUTROABS 3.9  --   HGB 9.4* 8.8*  HCT 32.8* 31.7*  MCV 75.4* 76.8*  PLT 313 321   Cardiac Enzymes: No results for input(s): CKTOTAL, CKMB, CKMBINDEX, TROPONINI in the last 168 hours. BNP: Invalid input(s): POCBNP CBG:  Recent Labs Lab 05/23/15 1130 05/23/15 1642 05/23/15 2130 05/24/15 0755 05/24/15 1126  GLUCAP 299* 184* 223* 260* 295*    No results found for this or any previous visit (from the past 240 hour(s)).   Scheduled Meds: . arformoterol  15 mcg Nebulization BID  . aspirin  81 mg Oral Daily  . budesonide (PULMICORT) nebulizer solution  0.25 mg Nebulization BID  . carvedilol  12.5 mg Oral BID WC  . enoxaparin (LOVENOX) injection  60 mg Subcutaneous Daily  . feeding supplement  1 Container Oral TID BM  . ferrous sulfate  325 mg Oral BID WC  . fluticasone  2 spray Each Nare BID  . furosemide  40 mg Oral BID  . insulin aspart  0-15 Units Subcutaneous TID WC  .  insulin aspart  0-5 Units Subcutaneous QHS  . insulin aspart  8 Units Subcutaneous TID WC  . insulin detemir  45 Units Subcutaneous QHS  . ipratropium-albuterol  3 mL Nebulization Q4H  . levofloxacin (LEVAQUIN) IV  750 mg Intravenous Q24H  .  methylPREDNISolone (SOLU-MEDROL) injection  80 mg Intravenous Q24H  . pantoprazole  40 mg Oral Daily  . prasugrel  10 mg Oral Daily  . pravastatin  80 mg Oral QPM

## 2015-05-25 LAB — GLUCOSE, CAPILLARY
GLUCOSE-CAPILLARY: 130 mg/dL — AB (ref 65–99)
Glucose-Capillary: 113 mg/dL — ABNORMAL HIGH (ref 65–99)
Glucose-Capillary: 179 mg/dL — ABNORMAL HIGH (ref 65–99)
Glucose-Capillary: 195 mg/dL — ABNORMAL HIGH (ref 65–99)

## 2015-05-25 NOTE — Progress Notes (Addendum)
Patient ID: Tamara Silva, female   DOB: Apr 26, 1961, 54 y.o.   MRN: [id number] [facility] PROGRESS NOTE  Tamara Silva [id number] DOB: Jan 29, 1961 DOA: 05/20/2015 PCP: Angelica Chessman, MD  Brief narrative:    54 y.o. female with a past medical history significant for IDDM, chronic systolic and diastolic CHF from ischemic CM last EF 40%, COPD on home O2 intermittently, chronic PE (on asa and effient), Bipolar disease who presents with worsening shortness of breath for past 1 week PTA associated with cough and wheezing, no fever or chills.  In the ED, the patient was wheezing, tachypneic and required 4L [place] to maintain O2 saturation >90%. CXR demonstrated chronic lung disease without acute pulmonary process. She was given solumedrol in ED and neb treatments but continued to have hypoxia and shortness of breath. She was admitted for COPD exacerbation.  Barrier to discharge: slow to improve rhonchi, wheezing. Still on IV solumedrol.   Assessment/Plan:    Principal Problem:   Acute respiratory failure with hypoxia (HCC) / COPD exacerbation (HCC) / COPD GOLD III - Hypoxia due to COPD and pneumonia - Continue current nebulizer treatments - Continue solumedrol 80 mg IV Q 24 horus - Continue empiric Levaquin  - Continue oxygen support via [place] to keep O2 sats above 90%  Active Problems:   Chronic pulmonary embolism (HCC) / Chronic anticoagulation - Continue aspirin and prasugrel    Bipolar disorder (HCC) - Stable    Essential hypertension - Continue carvedilol and lasix - Resume aldactone  - BP controlled, 130/70 this am    Chronic combined systolic and diastolic congestive heart failure, NYHA class 2; EF 30-35% /  CAD S/P percutaneous coronary angioplasty - prior PCI to LAD; RCA PCI: new Xience Alpine DES 2.75 mm x 15 mm  - 2 D ECHO in 07/2014 with EF 40% - Continue aspirin, effient - Continue coreg    CKD (chronic kidney disease), stage II - Baseline Cr a year ago was  1.16 - Cr WNL    Hyperkalemia - Due to aldactone which were placed on hold  - Now WNL, liekly resolved with lasix     Anemia of chronic kidney failure, stage 2 - Continue iron supplements     Dyslipidemia associated with type 2 diabetes mellitus (HCC) - Continue Pravachol 80 mg daily     Controlled diabetes mellitus with diabetic nephropathy, with long-term current use of insulin (HCC) - A1c on this admission 7.9 - Continue Lantus 45 units at bedtime and novolog 8 units TID and SSI - CBG's in past 24 hours: 231, 113, 179    Morbid obesity due to excess calories (HCC) - Continue nutritional supplementation  DVT Prophylaxis  - Lovenox subQ   Code Status: Full.  Family Communication:  plan of care discussed with the patient Disposition Plan: D/C likely 05/27/2015 if resp status better.   IV access:  Peripheral IV  Procedures and diagnostic studies:    Dg Chest Port 1 View 2015-05-27 Stable cardiomegaly with prominence of the central pulmonary vasculature suggesting mild edema. Stable scarring over the left midlung along the minor fissure. Electronically Signed   By: Marin Olp M.D.   On: May 27, 2015 12:21   Dg Chest 2 View 05/20/2015  Chronic lung disease without definite acute overlying pulmonary process. Stable prominent mediastinal contours likely due to borderline adenopathy seen on prior CT scans. Does this patient have sarcoidosis? Electronically Signed   By: Marijo Sanes M.D.   On: 05/20/2015 18:29   Medical Consultants:  None  Other Consultants:  None   IAnti-Infectives:   Levaquin 05/23/2015 -->   Leisa Lenz, MD  [facility] Pager [phone]  Time spent in minutes: 15 minutes  If 7PM-7AM, please contact night-coverage [email or web address] Password [facility] 05/25/2015, 12:50 PM   LOS: 5 days    HPI/Subjective: No acute overnight events. Patient reports no resp distress.   Objective: Filed Vitals:   05/25/15 0541 05/25/15 0741 05/25/15 0932  05/25/15 1111  BP: 125/68  130/70   Pulse: 73  75   Temp: 98 F (36.7 C)  97.9 F (36.6 C)   TempSrc: Oral  Oral   Resp: 16     Height:      Weight:      SpO2: 97% 98% 100% 97%    Intake/Output Summary (Last 24 hours) at 05/25/15 1250 Last data filed at 05/25/15 0743  Gross per 24 hour  Intake    240 ml  Output    500 ml  Net   -260 ml    Exam:   General:  Pt is alert, awake, no distress  Cardiovascular: Rate controlled, appreciate S1, S2  Respiratory: rhonchi in upper lung lobes, mild wheezing in upper lung lobes  Abdomen: (+) BS, non tender abdomen   Extremities: No edema, palpable pulses   Neuro: No focal deficits   Data Reviewed: Basic Metabolic Panel:  Recent Labs Lab 05/20/15 1739 05/21/15 0526 05/23/15 1644  NA 141 137 140  K 4.1 5.6* 4.8  CL 101 97* 97*  CO2 31 32 37*  GLUCOSE 181* 364* 185*  BUN 8 12 30*  CREATININE 0.68 0.97 0.92  CALCIUM 8.6* 8.7* 8.9   Liver Function Tests:  Recent Labs Lab 05/20/15 1739  AST 16  ALT 21  ALKPHOS 60  BILITOT 0.2*  PROT 7.7  ALBUMIN 3.3*   No results for input(s): LIPASE, AMYLASE in the last 168 hours. No results for input(s): AMMONIA in the last 168 hours. CBC:  Recent Labs Lab 05/20/15 1739 05/21/15 0526  WBC 6.1 7.3  NEUTROABS 3.9  --   HGB 9.4* 8.8*  HCT 32.8* 31.7*  MCV 75.4* 76.8*  PLT 313 321   Cardiac Enzymes: No results for input(s): CKTOTAL, CKMB, CKMBINDEX, TROPONINI in the last 168 hours. BNP: Invalid input(s): POCBNP CBG:  Recent Labs Lab 05/24/15 1126 05/24/15 1651 05/24/15 2151 05/25/15 0733 05/25/15 1148  GLUCAP 295* 202* 231* 113* 179*    No results found for this or any previous visit (from the past 240 hour(s)).   [name] Kitchen arformoterol  15 mcg Nebulization BID  . aspirin  81 mg Oral Daily  . budesonide (PULMICORT) nebulizer solution  0.25 mg Nebulization BID  . carvedilol  12.5 mg Oral BID WC  . enoxaparin (LOVENOX) injection  60 mg Subcutaneous Daily  .  feeding supplement  1 Container Oral TID BM  . ferrous sulfate  325 mg Oral BID WC  . fluticasone  2 spray Each Nare BID  . furosemide  40 mg Oral BID  . insulin aspart  0-15 Units Subcutaneous TID WC  . insulin aspart  0-5 Units Subcutaneous QHS  . insulin aspart  8 Units Subcutaneous TID WC  . insulin detemir  45 Units Subcutaneous QHS  . ipratropium-albuterol  3 mL Nebulization Q4H  . levofloxacin  750 mg Oral Daily  . methylPREDNISolone (SOLU-MEDROL) injection  80 mg Intravenous Q24H  . pantoprazole  40 mg Oral Daily  . prasugrel  10 mg Oral Daily  . pravastatin  80 mg  Oral QPM

## 2015-05-26 LAB — GLUCOSE, CAPILLARY: GLUCOSE-CAPILLARY: 130 mg/dL — AB (ref 65–99)

## 2015-05-26 MED ORDER — PRAVASTATIN SODIUM 80 MG PO TABS: 80.0000 mg | ORAL_TABLET | Freq: Every evening | ORAL | Status: DC

## 2015-05-26 MED ORDER — INSULIN DETEMIR 100 UNIT/ML ~~LOC~~ SOLN: 45.0000 [IU] | Freq: Every day | SUBCUTANEOUS | Status: DC

## 2015-05-26 MED ORDER — PREDNISONE 5 MG PO TABS: 50.0000 mg | ORAL_TABLET | Freq: Every day | ORAL | Status: DC

## 2015-05-26 MED ORDER — LEVOFLOXACIN 750 MG PO TABS: 750.0000 mg | ORAL_TABLET | Freq: Every day | ORAL | Status: DC

## 2015-05-26 MED ORDER — CARVEDILOL 12.5 MG PO TABS
12.5000 mg | ORAL_TABLET | Freq: Two times a day (BID) | ORAL | Status: DC
Start: 2015-05-26 — End: 2015-07-08

## 2015-05-26 MED ORDER — BOOST / RESOURCE BREEZE PO LIQD: 1.0000 | Freq: Three times a day (TID) | ORAL | Status: DC

## 2015-05-26 NOTE — Care Management Note (Signed)
Case Management Note  Patient Details  Name: Tamara Silva MRN: [id number] Date of Birth: 1960/06/28  Subjective/Objective:  [facility] dme rep Lecretia-delivered home neb machine to rm. No further d/c needs.                  Action/Plan:d/c home.   Expected Discharge Date:                  Expected Discharge Plan:  Home/Self Care  In-House Referral:     Discharge planning Services  CM Consult  Post Acute Care Choice:  Durable Medical Equipment ([employer]-home 02) Choice offered to:  Patient  DME Arranged:  Nebulizer machine DME Agency:  Index:    [place] Agency:     Status of Service:  Completed, signed off  Medicare Important Message Given:    Date Medicare IM Given:    Medicare IM give by:    Date Additional Medicare IM Given:    Additional Medicare Important Message give by:     If discussed at [place] of Stay Meetings, dates discussed:    Additional Comments:  Dessa Phi, RN 05/26/2015, 11:29 AM

## 2015-05-26 NOTE — Discharge Summary (Signed)
Physician Discharge Summary  Tamara Silva [id number] DOB: 27-Feb-1961 DOA: 05/20/2015  PCP: Angelica Chessman, MD  Admit date: 05/20/2015 Discharge date: 05/26/2015  Recommendations for Outpatient Follow-up:  Taper down prednisone starting from 50 mg a day, taper down by 5 mg a day down to 0 mg. for ex, today 50 mg, tomorrow 45 mg, then 40 mg the following day and etc...  Take Levaquin for pneumonia for 5 days on discharge  You may continue lasix, aldactone and coreg on discharge  Insulin was increased to 45 units from 42 units you used to take. Continue sliding scale insulin as before.  Discharge Diagnoses:  Principal Problem:   Acute respiratory failure with hypoxia (HCC) Active Problems:   COPD exacerbation (HCC)   COPD GOLD III    Chronic anticoagulation   Chronic pulmonary embolism (HCC)   Bipolar disorder (HCC)   Essential hypertension   Chronic combined systolic and diastolic congestive heart failure, NYHA class 2; EF 30-35%   Morbid obesity due to excess calories (HCC)   CAD S/P percutaneous coronary angioplasty - prior PCI to LAD; RCA PCI: new Xience Alpine DES 2.75 mm x 15 mm    CKD (chronic kidney disease), stage II   Dyslipidemia associated with type 2 diabetes mellitus (HCC)   Anemia of chronic kidney failure   Hyperkalemia   Uncontrolled type 2 diabetes mellitus without complication, with long-term current use of insulin ([place])    Discharge Condition: stable   Diet recommendation: as tolerated   History of present illness:  54 y.o. female with a past medical history significant for IDDM, chronic systolic and diastolic CHF from ischemic CM last EF 40%, COPD on home O2 intermittently, chronic PE (on asa and effient), Bipolar disease who presents with worsening shortness of breath for past 1 week PTA associated with cough and wheezing, no fever or chills.  In the ED, the patient was wheezing, tachypneic and required 4L [place] to maintain O2 saturation >90%.  CXR demonstrated chronic lung disease without acute pulmonary process. She was given solumedrol in ED and neb treatments but continued to have hypoxia and shortness of breath. She was admitted for COPD exacerbation.  Hospital Course:   Assessment/Plan:    Principal Problem:  Acute respiratory failure with hypoxia (HCC) / COPD exacerbation ([place]) / COPD GOLD III - Hypoxia due to COPD and pneumonia - Order placed for DME neb machine and meds. She will continue neb treatment as per prior to the admission (doneb) - Taper down prednisone starting from 50 mg a day, taper down by 5 mg a day down to 0 mg. for ex, today 50 mg, tomorrow 45 mg, then 40 mg the following day and etc... - She was getting solumedrol IV in hospital  - Continue Levaquin for 5 more days on discharge  - Continue oxygen support via [place] to keep O2 sats above 90%  Active Problems:  Chronic pulmonary embolism (HCC) / Chronic anticoagulation - Continue aspirin and prasugrel on discharge    Bipolar disorder ([place]) - Stable   Essential hypertension - Continue carvedilol, aldactone, lasix   Chronic combined systolic and diastolic congestive heart failure, NYHA class 2; EF 30-35% / CAD S/P percutaneous coronary angioplasty - prior PCI to LAD; RCA PCI: new Xience Alpine DES 2.75 mm x 15 mm  - 2 D ECHO in 07/2014 with EF 40% - Continue aspirin, effient - Continue coreg and aldactone    CKD (chronic kidney disease), stage II - Baseline Cr a year ago was 1.16 -  Cr WNL   Hyperkalemia - Due to lisinopril and aldactone which were placed on hold  - Now WNL, liekly resolved with lasix    Anemia of chronic kidney failure, stage 2 - Pt may continue iron supplementation although she was not very compliant with that    Dyslipidemia associated with type 2 diabetes mellitus (HCC) - Continue Pravachol 80 mg daily    Controlled diabetes mellitus with diabetic nephropathy, with long-term current use of insulin (HCC) - A1c  on this admission 7.9 - Continue Lantus 45 units at bedtime and SSI per home regimen   Morbid obesity due to excess calories (HCC) - Continue nutritional supplementation  DVT Prophylaxis  - Lovenox subQ in hospital   Code Status: Full.  Family Communication: plan of care discussed with the patient   IV access:  Peripheral IV  Procedures and diagnostic studies:   Dg Chest Port 1 View 05/22/2015 Stable cardiomegaly with prominence of the central pulmonary vasculature suggesting mild edema. Stable scarring over the left midlung along the minor fissure. Electronically Signed By: Marin Olp M.D. On: 05/22/2015 12:21   Dg Chest 2 View 05/20/2015 Chronic lung disease without definite acute overlying pulmonary process. Stable prominent mediastinal contours likely due to borderline adenopathy seen on prior CT scans. Does this patient have sarcoidosis? Electronically Signed By: Marijo Sanes M.D. On: 05/20/2015 18:29   Medical Consultants:  None   Other Consultants:  None   IAnti-Infectives:   Levaquin 05/23/2015 --> for 5 days on discharge    Signed:  Leisa Lenz, MD  [facility] 05/26/2015, 10:25 AM  Pager #: [phone]  Time spent in minutes: more than 30 minutes   Discharge Exam: Filed Vitals:   05/25/15 2059 05/26/15 0502  BP: 121/57 114/53  Pulse: 65 65  Temp: 97.9 F (36.6 C) 97.6 F (36.4 C)  Resp: 18 18   Filed Vitals:   05/25/15 2350 05/26/15 0502 05/26/15 0745 05/26/15 0753  BP:  114/53    Pulse:  65    Temp:  97.6 F (36.4 C)    TempSrc:  Oral    Resp:  18    Height:      Weight:      SpO2: 95% 100% 100% 100%    General: Pt is alert, follows commands appropriately, not in acute distress Cardiovascular: Regular rate and rhythm, S1/S2 +, no murmurs Respiratory: Mild wheezing in upper lung lobes, mild rhonchi in upper lung lobes Abdominal: Soft, non tender, non distended, bowel sounds +, no  guarding Extremities: no edema, no cyanosis, pulses palpable bilaterally DP and PT Neuro: Grossly nonfocal  Discharge Instructions  Discharge Instructions    Call MD for:  difficulty breathing, headache or visual disturbances    Complete by:  As directed      Call MD for:  hives    Complete by:  As directed      Call MD for:  persistant dizziness or light-headedness    Complete by:  As directed      Call MD for:  persistant nausea and vomiting    Complete by:  As directed      Call MD for:  redness, tenderness, or signs of infection (pain, swelling, redness, odor or green/yellow discharge around incision site)    Complete by:  As directed      Diet - low sodium heart healthy    Complete by:  As directed      Discharge instructions    Complete by:  As directed  Taper down prednisone starting from 50 mg a day, taper down by 5 mg a day down to 0 mg. for ex, today 50 mg, tomorrow 45 mg, then 40 mg the following day and etc...  Take Levaquin for pneumonia for 5 days on discharge  You may continue lasix, aldactone and coreg on discharge  Insulin was increased to 45 units from 42 units you used to take. Continue sliding scale insulin as before.     Increase activity slowly    Complete by:  As directed             Medication List    STOP taking these medications        bisoprolol 5 MG tablet  Commonly known as:  ZEBETA     valsartan 160 MG tablet  Commonly known as:  DIOVAN      TAKE these medications        ACCU-CHEK ADVANTAGE TEST VI  1 each by Other route See admin instructions. Check blood sugar 4 times daily.     ACCU-CHEK AVIVA PLUS test strip  Generic drug:  glucose blood  USE 1 STRIP TO CHECK GLUCOSE 3 TIMES DAILY     ACCU-CHEK SOFTCLIX LANCETS lancets  USE 1 LANCET TO CHECK GLUCOSE 3 TIMES DAILY     acetaminophen-codeine 300-30 MG tablet  Commonly known as:  TYLENOL #3  Take 1 tablet by mouth every 4 (four) hours as needed.     aspirin 81 MG tablet   Take 1 tablet (81 mg total) by mouth daily.     carvedilol 12.5 MG tablet  Commonly known as:  COREG  Take 1 tablet (12.5 mg total) by mouth 2 (two) times daily with a meal.     cycloSPORINE 0.05 % ophthalmic emulsion  Commonly known as:  RESTASIS  Place 2 drops into both eyes 2 (two) times daily as needed (for dry eyes).     EASY TOUCH PEN NEEDLES 31G X 8 MM Misc  Generic drug:  Insulin Pen Needle  USE AS DIRECTED TO INJECT INSULIN     EFFIENT 10 MG Tabs tablet  Generic drug:  prasugrel  TAKE ONE TABLET BY MOUTH DAILY.     feeding supplement Liqd  Take 1 Container by mouth 3 (three) times daily between meals.     fluticasone 50 MCG/ACT nasal spray  Commonly known as:  FLONASE  USE 2 SPRAYS IN EACH NOSTRIL TWICE DAILY     furosemide 40 MG tablet  Commonly known as:  LASIX  TAKE 2 TABLETS BY MOUTH IN THE MORNING AND THEN TAKE 1 TABLET IN THE EVENING     insulin aspart 100 UNIT/ML injection  Commonly known as:  novoLOG  Inject 15-20 Units into the skin 3 (three) times daily before meals. Sliding scale     insulin detemir 100 UNIT/ML injection  Commonly known as:  LEVEMIR  Inject 0.45 mLs (45 Units total) into the skin at bedtime.     INSULIN SYRINGE .5CC/29G 29G X 1/2" 0.5 ML Misc  Use as instructed by physician     INVEGA SUSTENNA 117 MG/0.75ML Susp  Generic drug:  Paliperidone Palmitate  Inject 156 mg into the muscle every 3 (three) months.     ipratropium-albuterol 0.5-2.5 (3) MG/3ML Soln  Commonly known as:  DUONEB  Take 3 mLs by nebulization every 6 (six) hours as needed.     levofloxacin 750 MG tablet  Commonly known as:  LEVAQUIN  Take 1 tablet (750 mg total) by  mouth daily.     nitroGLYCERIN 0.4 MG SL tablet  Commonly known as:  NITROSTAT  Place 1 tablet (0.4 mg total) under the tongue every 5 (five) minutes as needed for chest pain. MUST MAKE APPOINTMENT FOR FUTURE REFILLS     omeprazole 40 MG capsule  Commonly known as:  PRILOSEC  TAKE 1 CAPSULE BY  MOUTH TWICE DAILY     ondansetron 8 MG disintegrating tablet  Commonly known as:  ZOFRAN ODT  Take 1 tablet (8 mg total) by mouth every 8 (eight) hours as needed for nausea or vomiting.     pravastatin 80 MG tablet  Commonly known as:  PRAVACHOL  Take 1 tablet (80 mg total) by mouth every evening.     prazosin 5 MG capsule  Commonly known as:  MINIPRESS  TAKE 1 CAPSULE BY MOUTH EVERY NIGHT AT BEDTIME FOR NIGHTMARES     predniSONE 5 MG tablet  Commonly known as:  DELTASONE  Take 10 tablets (50 mg total) by mouth daily with breakfast.     PROAIR HFA 108 (90 BASE) MCG/ACT inhaler  Generic drug:  albuterol  INHALE 2 PUFFS BY MOUTH 4 TIMES DAILY AS NEEDED     promethazine 25 MG tablet  Commonly known as:  PHENERGAN  Take 1 tablet (25 mg total) by mouth every 6 (six) hours as needed for nausea or vomiting.     spironolactone 25 MG tablet  Commonly known as:  ALDACTONE  Take 1 tablet (25 mg total) by mouth daily.           Follow-up Information    Follow up with Angelica Chessman, MD. Schedule an appointment as soon as possible for a visit in 1 week.   Specialty:  Internal Medicine   Why:  Follow up appt after recent hospitalization   Contact information:   [place] [address] [phone]        The results of significant diagnostics from this hospitalization (including imaging, microbiology, ancillary and laboratory) are listed below for reference.    Significant Diagnostic Studies: Dg Chest 2 View  05/20/2015  CLINICAL DATA:  Shortness of breath today. EXAM: CHEST  2 VIEW COMPARISON:  Chest x-ray 01/07/2015 and prior CT scan 03/04/2014. FINDINGS: The heart is mildly enlarged but stable. Prominent mediastinal contours are again demonstrated and likely due to borderline adenopathy seen on prior CT scans. Chronic lung disease with pulmonary fibrosis and scarring changes. There is also chronic left pleural thickening and calcifications. I do not see any  definite acute overlying pulmonary process. The bony thorax is intact. IMPRESSION: Chronic lung disease without definite acute overlying pulmonary process. Stable prominent mediastinal contours likely due to borderline adenopathy seen on prior CT scans. Does this patient have sarcoidosis? Electronically Signed   By: Marijo Sanes M.D.   On: 05/20/2015 18:29   Dg Chest Port 1 View  05/22/2015  CLINICAL DATA:  Shortness of breath, cough and congestion. EXAM: PORTABLE CHEST 1 VIEW COMPARISON:  05/20/2015, 01/07/2015 and chest CT 03/04/2014 FINDINGS: Lungs are adequately inflated with scarring over the lateral left midlung and stable fluid versus scarring over the minor fissure. Prominence of the central vascular markings bilaterally which may be due to mild vascular congestion. Stable cardiomegaly. Stable prominence of the right peritracheal region. Remainder of the exam is unchanged. IMPRESSION: Stable cardiomegaly with prominence of the central pulmonary vasculature suggesting mild edema. Stable scarring over the left midlung along the minor fissure. Electronically Signed   By: Quillian Quince  Derrel Nip M.D.   On: 05/22/2015 12:21    Microbiology: No results found for this or any previous visit (from the past 240 hour(s)).   Labs: Basic Metabolic Panel:  Recent Labs Lab 05/20/15 1739 05/21/15 0526 05/23/15 1644  NA 141 137 140  K 4.1 5.6* 4.8  CL 101 97* 97*  CO2 31 32 37*  GLUCOSE 181* 364* 185*  BUN 8 12 30*  CREATININE 0.68 0.97 0.92  CALCIUM 8.6* 8.7* 8.9   Liver Function Tests:  Recent Labs Lab 05/20/15 1739  AST 16  ALT 21  ALKPHOS 60  BILITOT 0.2*  PROT 7.7  ALBUMIN 3.3*   No results for input(s): LIPASE, AMYLASE in the last 168 hours. No results for input(s): AMMONIA in the last 168 hours. CBC:  Recent Labs Lab 05/20/15 1739 05/21/15 0526  WBC 6.1 7.3  NEUTROABS 3.9  --   HGB 9.4* 8.8*  HCT 32.8* 31.7*  MCV 75.4* 76.8*  PLT 313 321   Cardiac Enzymes: No results for  input(s): CKTOTAL, CKMB, CKMBINDEX, TROPONINI in the last 168 hours. BNP: BNP (last 3 results) No results for input(s): BNP in the last 8760 hours.  ProBNP (last 3 results) No results for input(s): PROBNP in the last 8760 hours.  CBG:  Recent Labs Lab 05/25/15 0733 05/25/15 1148 05/25/15 1649 05/25/15 2102 05/26/15 0749  GLUCAP 113* 179* 130* 195* 130*

## 2015-05-26 NOTE — Progress Notes (Signed)
Tamara Silva to be D/C'd Home per MD order.  Discussed prescriptions and follow up appointments with the patient. Prescriptions given to patient, medication list explained in detail. Pt verbalized understanding.    Medication List    STOP taking these medications        bisoprolol 5 MG tablet  Commonly known as:  ZEBETA     valsartan 160 MG tablet  Commonly known as:  DIOVAN      TAKE these medications        ACCU-CHEK ADVANTAGE TEST VI  1 each by Other route See admin instructions. Check blood sugar 4 times daily.     ACCU-CHEK AVIVA PLUS test strip  Generic drug:  glucose blood  USE 1 STRIP TO CHECK GLUCOSE 3 TIMES DAILY     ACCU-CHEK SOFTCLIX LANCETS lancets  USE 1 LANCET TO CHECK GLUCOSE 3 TIMES DAILY     acetaminophen-codeine 300-30 MG tablet  Commonly known as:  TYLENOL #3  Take 1 tablet by mouth every 4 (four) hours as needed.     aspirin 81 MG tablet  Take 1 tablet (81 mg total) by mouth daily.     carvedilol 12.5 MG tablet  Commonly known as:  COREG  Take 1 tablet (12.5 mg total) by mouth 2 (two) times daily with a meal.     cycloSPORINE 0.05 % ophthalmic emulsion  Commonly known as:  RESTASIS  Place 2 drops into both eyes 2 (two) times daily as needed (for dry eyes).     EASY TOUCH PEN NEEDLES 31G X 8 MM Misc  Generic drug:  Insulin Pen Needle  USE AS DIRECTED TO INJECT INSULIN     EFFIENT 10 MG Tabs tablet  Generic drug:  prasugrel  TAKE ONE TABLET BY MOUTH DAILY.     feeding supplement Liqd  Take 1 Container by mouth 3 (three) times daily between meals.     fluticasone 50 MCG/ACT nasal spray  Commonly known as:  FLONASE  USE 2 SPRAYS IN EACH NOSTRIL TWICE DAILY     furosemide 40 MG tablet  Commonly known as:  LASIX  TAKE 2 TABLETS BY MOUTH IN THE MORNING AND THEN TAKE 1 TABLET IN THE EVENING     insulin aspart 100 UNIT/ML injection  Commonly known as:  novoLOG  Inject 15-20 Units into the skin 3 (three) times daily before meals. Sliding  scale     insulin detemir 100 UNIT/ML injection  Commonly known as:  LEVEMIR  Inject 0.45 mLs (45 Units total) into the skin at bedtime.     INSULIN SYRINGE .5CC/29G 29G X 1/2" 0.5 ML Misc  Use as instructed by physician     INVEGA SUSTENNA 117 MG/0.75ML Susp  Generic drug:  Paliperidone Palmitate  Inject 156 mg into the muscle every 3 (three) months.     ipratropium-albuterol 0.5-2.5 (3) MG/3ML Soln  Commonly known as:  DUONEB  Take 3 mLs by nebulization every 6 (six) hours as needed.     levofloxacin 750 MG tablet  Commonly known as:  LEVAQUIN  Take 1 tablet (750 mg total) by mouth daily.     nitroGLYCERIN 0.4 MG SL tablet  Commonly known as:  NITROSTAT  Place 1 tablet (0.4 mg total) under the tongue every 5 (five) minutes as needed for chest pain. MUST MAKE APPOINTMENT FOR FUTURE REFILLS     omeprazole 40 MG capsule  Commonly known as:  PRILOSEC  TAKE 1 CAPSULE BY MOUTH TWICE DAILY     ondansetron  8 MG disintegrating tablet  Commonly known as:  ZOFRAN ODT  Take 1 tablet (8 mg total) by mouth every 8 (eight) hours as needed for nausea or vomiting.     pravastatin 80 MG tablet  Commonly known as:  PRAVACHOL  Take 1 tablet (80 mg total) by mouth every evening.     prazosin 5 MG capsule  Commonly known as:  MINIPRESS  TAKE 1 CAPSULE BY MOUTH EVERY NIGHT AT BEDTIME FOR NIGHTMARES     predniSONE 5 MG tablet  Commonly known as:  DELTASONE  Take 10 tablets (50 mg total) by mouth daily with breakfast.     PROAIR HFA 108 (90 BASE) MCG/ACT inhaler  Generic drug:  albuterol  INHALE 2 PUFFS BY MOUTH 4 TIMES DAILY AS NEEDED     promethazine 25 MG tablet  Commonly known as:  PHENERGAN  Take 1 tablet (25 mg total) by mouth every 6 (six) hours as needed for nausea or vomiting.     spironolactone 25 MG tablet  Commonly known as:  ALDACTONE  Take 1 tablet (25 mg total) by mouth daily.        Filed Vitals:   05/25/15 2059 05/26/15 0502  BP: 121/57 114/53  Pulse: 65 65   Temp: 97.9 F (36.6 C) 97.6 F (36.4 C)  Resp: 18 18    Skin clean, dry and intact without evidence of skin break down, no evidence of skin tears noted. IV catheter discontinued intact. Site without signs and symptoms of complications. Dressing and pressure applied. Pt denies pain at this time. No complaints noted.  An After Visit Summary was printed and given to the patient. Patient escorted via [place], and D/C home via private auto.  Tamara Silva S 05/26/2015 11:40 AM

## 2015-05-26 NOTE — Discharge Instructions (Signed)
Community-Acquired Pneumonia, Adult °Pneumonia is an infection of the lungs. There are different types of pneumonia. One type can develop while a person is in a hospital. A different type, called community-acquired pneumonia, develops in people who are not, or have not recently been, in the hospital or other health care facility.  °CAUSES °Pneumonia may be caused by bacteria, viruses, or funguses. Community-acquired pneumonia is often caused by Streptococcus pneumonia bacteria. These bacteria are often passed from one person to another by breathing in droplets from the cough or sneeze of an infected person. °RISK FACTORS °The condition is more likely to develop in: °· People who have chronic diseases, such as chronic obstructive pulmonary disease (COPD), asthma, congestive heart failure, cystic fibrosis, diabetes, or kidney disease. °· People who have early-stage or late-stage HIV. °· People who have sickle cell disease. °· People who have had their spleen removed (splenectomy). °· People who have poor dental hygiene. °· People who have medical conditions that increase the risk of breathing in (aspirating) secretions their own mouth and nose.   °· People who have a weakened immune system (immunocompromised). °· People who smoke. °· People who travel to areas where pneumonia-causing germs commonly exist. °· People who are around animal habitats or animals that have pneumonia-causing germs, including birds, bats, rabbits, cats, and farm animals. °SYMPTOMS °Symptoms of this condition include: °· A dry cough. °· A wet (productive) cough. °· Fever. °· Sweating. °· Chest pain, especially when breathing deeply or coughing. °· Rapid breathing or difficulty breathing. °· Shortness of breath. °· Shaking chills. °· Fatigue. °· Muscle aches. °DIAGNOSIS °Your health care provider will take a medical history and perform a physical exam. You may also have other tests, including: °· Imaging studies of your chest, including  X-rays. °· Tests to check your blood oxygen level and other blood gases. °· Other tests on blood, mucus (sputum), fluid around your lungs (pleural fluid), and urine. °If your pneumonia is severe, other tests may be done to identify the specific cause of your illness. °TREATMENT °The type of treatment that you receive depends on many factors, such as the cause of your pneumonia, the medicines you take, and other medical conditions that you have. For most adults, treatment and recovery from pneumonia may occur at home. In some cases, treatment must happen in a hospital. Treatment may include: °· Antibiotic medicines, if the pneumonia was caused by bacteria. °· Antiviral medicines, if the pneumonia was caused by a virus. °· Medicines that are given by mouth or through an IV tube. °· Oxygen. °· Respiratory therapy. °Although rare, treating severe pneumonia may include: °· Mechanical ventilation. This is done if you are not breathing well on your own and you cannot maintain a safe blood oxygen level. °· Thoracentesis. This procedure removes fluid around one lung or both lungs to help you breathe better. °HOME CARE INSTRUCTIONS °· Take over-the-counter and prescription medicines only as told by your health care provider. °¨ Only take cough medicine if you are losing sleep. Understand that cough medicine can prevent your body's natural ability to remove mucus from your lungs. °¨ If you were prescribed an antibiotic medicine, take it as told by your health care provider. Do not stop taking the antibiotic even if you start to feel better. °· Sleep in a semi-upright position at night. Try sleeping in a reclining chair, or place a few pillows under your head. °· Do not use tobacco products, including cigarettes, chewing tobacco, and e-cigarettes. If you need help quitting, ask your health care provider. °· Drink enough water to keep your urine   clear or pale yellow. This will help to thin out mucus secretions in your  lungs. PREVENTION There are ways that you can decrease your risk of developing community-acquired pneumonia. Consider getting a pneumococcal vaccine if:  You are older than 55 years of age.  You are older than 55 years of age and are undergoing cancer treatment, have chronic lung disease, or have other medical conditions that affect your immune system. Ask your health care provider if this applies to you. There are different types and schedules of pneumococcal vaccines. Ask your health care provider which vaccination option is best for you. You may also prevent community-acquired pneumonia if you take these actions:  Get an influenza vaccine every year. Ask your health care provider which type of influenza vaccine is best for you.  Go to the dentist on a regular basis.  Wash your hands often. Use hand sanitizer if soap and water are not available. SEEK MEDICAL CARE IF:  You have a fever.  You are losing sleep because you cannot control your cough with cough medicine. SEEK IMMEDIATE MEDICAL CARE IF:  You have worsening shortness of breath.  You have increased chest pain.  Your sickness becomes worse, especially if you are an older adult or have a weakened immune system.  You cough up blood.   This information is not intended to replace advice given to you by your health care provider. Make sure you discuss any questions you have with your health care provider.   Document Released: 05/17/2005 Document Revised: 02/05/2015 Document Reviewed: 09/11/2014 Elsevier Interactive Patient Education 2016 [employer] Levofloxacin tablets What is this medicine? LEVOFLOXACIN (lee voe FLOX a sin) is a quinolone antibiotic. It is used to treat certain kinds of bacterial infections. It will not work for colds, flu, or other viral infections. This medicine may be used for other purposes; ask your health care provider or pharmacist if you have questions. What should I tell my health care provider  before I take this medicine? They need to know if you have any of these conditions: -bone problems -cerebral disease -history of low levels of potassium in the blood -irregular heartbeat -joint problems -kidney disease -myasthenia gravis -seizures -tendon problems -tingling of the fingers or toes, or other nerve disorder -an unusual or allergic reaction to levofloxacin, other quinolone antibiotics, foods, dyes, or preservatives -pregnant or trying to get pregnant -breast-feeding How should I use this medicine? Take this medicine by mouth with a full glass of water. Follow the directions on the prescription label. This medicine can be taken with or without food. Take your medicine at regular intervals. Do not take your medicine more often than directed. Do not skip doses or stop your medicine early even if you feel better. Do not stop taking except on your doctor's advice. A special MedGuide will be given to you by the pharmacist with each prescription and refill. Be sure to read this information carefully each time. Talk to your pediatrician regarding the use of this medicine in children. While this drug may be prescribed for children as young as 6 months for selected conditions, precautions do apply. Overdosage: If you think you have taken too much of this medicine contact a poison control center or emergency room at once. NOTE: This medicine is only for you. Do not share this medicine with others. What if I miss a dose? If you miss a dose, take it as soon as you remember. If it is almost time for your next dose, take  only that dose. Do not take double or extra doses. What may interact with this medicine? Do not take this medicine with any of the following medications: -arsenic trioxide -chloroquine -droperidol -medicines for irregular heart rhythm like amiodarone, disopyramide, dofetilide, flecainide, quinidine, procainamide, sotalol -some medicines for depression or mental problems  like phenothiazines, pimozide, and ziprasidone This medicine may also interact with the following medications: -amoxapine -antacids -birth control pills -cisapride -dairy products -didanosine (ddI) buffered tablets or powder -haloperidol -multivitamins -NSAIDS, medicines for pain and inflammation, like ibuprofen or naproxen -retinoid products like tretinoin or isotretinoin -risperidone -some other antibiotics like clarithromycin or erythromycin -sucralfate -theophylline -warfarin This list may not describe all possible interactions. Give your health care provider a list of all the medicines, herbs, non-prescription drugs, or dietary supplements you use. Also tell them if you smoke, drink alcohol, or use illegal drugs. Some items may interact with your medicine. What should I watch for while using this medicine? Tell your doctor or health care professional if your symptoms do not improve or if they get worse. Drink several glasses of water a day and cut down on drinks that contain caffeine. You must not get dehydrated while taking this medicine. You may get drowsy or dizzy. Do not drive, use machinery, or do anything that needs mental alertness until you know how this medicine affects you. Do not sit or stand up quickly, especially if you are an older patient. This reduces the risk of dizzy or fainting spells. This medicine can make you more sensitive to the sun. Keep out of the sun. If you cannot avoid being in the sun, wear protective clothing and use a sunscreen. Do not use sun lamps or tanning beds/booths. Contact your doctor if you get a sunburn. If you are a diabetic monitor your blood glucose carefully. If you get an unusual reading stop taking this medicine and call your doctor right away. Do not treat diarrhea with over-the-counter products. Contact your doctor if you have diarrhea that lasts more than 2 days or if the diarrhea is severe and watery. Avoid antacids, calcium, iron, and  zinc products for 2 hours before and 2 hours after taking a dose of this medicine. What side effects may I notice from receiving this medicine? Side effects that you should report to your doctor or health care professional as soon as possible: -allergic reactions like skin rash or hives, swelling of the face, lips, or tongue -anxious -confusion -depressed mood -diarrhea -fast, irregular heartbeat -hallucination, loss of contact with reality -joint, muscle, or tendon pain or swelling -pain, tingling, numbness in the hands or feet -suicidal thoughts or other mood changes -sunburn -unusually weak or tired Side effects that usually do not require medical attention (report to your doctor or health care professional if they continue or are bothersome): -dry mouth -headache -nausea -trouble sleeping This list may not describe all possible side effects. Call your doctor for medical advice about side effects. You may report side effects to FDA at 1-800-FDA-1088. Where should I keep my medicine? Keep out of the reach of children. Store at room temperature between 15 and 30 degrees C (59 and 86 degrees F). Keep in a tightly closed container. Throw away any unused medicine after the expiration date. NOTE: This sheet is a summary. It may not cover all possible information. If you have questions about this medicine, talk to your doctor, pharmacist, or health care provider.    2016, Elsevier/Gold Standard. (2014-12-26 12:40:18)

## 2015-05-26 NOTE — Care Management Note (Signed)
Case Management Note  Patient Details  Name: Earlyn Virrueta MRN: [id number] Date of Birth: 10-04-1960  Subjective/Objective:  Ordered for neb macine-Spoke to patient about d/c plans-she had a neb machine but it broke-she doesn't remember where she got it from, but she had script for nebs-but didn't use them since she had no machine.  I have left message w/[facility] dme rep Lecretia to asst w/neb machine.  Patient already uses [facility] for home 02-she has travel tank.  Nurse updated for patient to wait until I have f/u with [facility] on neb machine since patient anxious to leave.                  Action/Plan:d/c plan home.   Expected Discharge Date:                  Expected Discharge Plan:  Home/Self Care  In-House Referral:     Discharge planning Services  CM Consult  Post Acute Care Choice:  Durable Medical Equipment ([facility]-home 02) Choice offered to:     DME Arranged:    DME Agency:     HH Arranged:    [place] [employer] Agency:     Status of Service:  Completed, signed off  Medicare Important Message Given:    Date Medicare IM Given:    Medicare IM give by:    Date Additional Medicare IM Given:    Additional Medicare Important Message give by:     If discussed at [place] of Stay Meetings, dates discussed:    Additional Comments:  Dessa Phi, RN 05/26/2015, 10:38 AM

## 2015-06-20 ENCOUNTER — Encounter (HOSPITAL_COMMUNITY): Payer: Self-pay | Admitting: Emergency Medicine

## 2015-06-20 ENCOUNTER — Inpatient Hospital Stay (HOSPITAL_COMMUNITY)
Admission: EM | Admit: 2015-06-20 | Discharge: 2015-06-29 | DRG: 291 | Disposition: A | Discharge status: Home / Self Care | Payer: Medicaid Other | Attending: Internal Medicine | Admitting: Internal Medicine

## 2015-06-20 DIAGNOSIS — K7460 Unspecified cirrhosis of liver: Secondary | ICD-10-CM

## 2015-06-20 DIAGNOSIS — I252 Old myocardial infarction: Secondary | ICD-10-CM

## 2015-06-20 DIAGNOSIS — E1165 Type 2 diabetes mellitus with hyperglycemia: Secondary | ICD-10-CM | POA: Diagnosis not present

## 2015-06-20 DIAGNOSIS — R7989 Other specified abnormal findings of blood chemistry: Secondary | ICD-10-CM

## 2015-06-20 DIAGNOSIS — E874 Mixed disorder of acid-base balance: Secondary | ICD-10-CM | POA: Diagnosis not present

## 2015-06-20 DIAGNOSIS — F419 Anxiety disorder, unspecified: Secondary | ICD-10-CM | POA: Diagnosis not present

## 2015-06-20 DIAGNOSIS — F319 Bipolar disorder, unspecified: Secondary | ICD-10-CM | POA: Diagnosis present

## 2015-06-20 DIAGNOSIS — T383X1A Poisoning by insulin and oral hypoglycemic [antidiabetic] drugs, accidental (unintentional), initial encounter: Secondary | ICD-10-CM | POA: Diagnosis present

## 2015-06-20 DIAGNOSIS — Z825 Family history of asthma and other chronic lower respiratory diseases: Secondary | ICD-10-CM

## 2015-06-20 DIAGNOSIS — K219 Gastro-esophageal reflux disease without esophagitis: Secondary | ICD-10-CM | POA: Diagnosis present

## 2015-06-20 DIAGNOSIS — I2782 Chronic pulmonary embolism: Secondary | ICD-10-CM | POA: Diagnosis present

## 2015-06-20 DIAGNOSIS — J9620 Acute and chronic respiratory failure, unspecified whether with hypoxia or hypercapnia: Secondary | ICD-10-CM | POA: Diagnosis present

## 2015-06-20 DIAGNOSIS — E1100 Type 2 diabetes mellitus with hyperosmolarity without nonketotic hyperglycemic-hyperosmolar coma (NKHHC): Secondary | ICD-10-CM | POA: Diagnosis present

## 2015-06-20 DIAGNOSIS — G9341 Metabolic encephalopathy: Secondary | ICD-10-CM | POA: Diagnosis not present

## 2015-06-20 DIAGNOSIS — Z955 Presence of coronary angioplasty implant and graft: Secondary | ICD-10-CM

## 2015-06-20 DIAGNOSIS — J189 Pneumonia, unspecified organism: Secondary | ICD-10-CM | POA: Diagnosis present

## 2015-06-20 DIAGNOSIS — Z800 Family history of malignant neoplasm of digestive organs: Secondary | ICD-10-CM

## 2015-06-20 DIAGNOSIS — G9340 Encephalopathy, unspecified: Secondary | ICD-10-CM

## 2015-06-20 DIAGNOSIS — Z803 Family history of malignant neoplasm of breast: Secondary | ICD-10-CM

## 2015-06-20 DIAGNOSIS — I2510 Atherosclerotic heart disease of native coronary artery without angina pectoris: Secondary | ICD-10-CM | POA: Diagnosis present

## 2015-06-20 DIAGNOSIS — Z7982 Long term (current) use of aspirin: Secondary | ICD-10-CM

## 2015-06-20 DIAGNOSIS — E162 Hypoglycemia, unspecified: Secondary | ICD-10-CM

## 2015-06-20 DIAGNOSIS — I5023 Acute on chronic systolic (congestive) heart failure: Secondary | ICD-10-CM | POA: Diagnosis present

## 2015-06-20 DIAGNOSIS — I255 Ischemic cardiomyopathy: Secondary | ICD-10-CM | POA: Diagnosis present

## 2015-06-20 DIAGNOSIS — Z888 Allergy status to other drugs, medicaments and biological substances status: Secondary | ICD-10-CM

## 2015-06-20 DIAGNOSIS — Z9861 Coronary angioplasty status: Secondary | ICD-10-CM

## 2015-06-20 DIAGNOSIS — R0902 Hypoxemia: Secondary | ICD-10-CM

## 2015-06-20 DIAGNOSIS — E875 Hyperkalemia: Secondary | ICD-10-CM | POA: Diagnosis not present

## 2015-06-20 DIAGNOSIS — D509 Iron deficiency anemia, unspecified: Secondary | ICD-10-CM | POA: Diagnosis present

## 2015-06-20 DIAGNOSIS — E11649 Type 2 diabetes mellitus with hypoglycemia without coma: Secondary | ICD-10-CM | POA: Diagnosis present

## 2015-06-20 DIAGNOSIS — I509 Heart failure, unspecified: Secondary | ICD-10-CM

## 2015-06-20 DIAGNOSIS — R0600 Dyspnea, unspecified: Secondary | ICD-10-CM

## 2015-06-20 DIAGNOSIS — D631 Anemia in chronic kidney disease: Secondary | ICD-10-CM | POA: Diagnosis present

## 2015-06-20 DIAGNOSIS — J45909 Unspecified asthma, uncomplicated: Secondary | ICD-10-CM | POA: Diagnosis present

## 2015-06-20 DIAGNOSIS — B373 Candidiasis of vulva and vagina: Secondary | ICD-10-CM | POA: Diagnosis present

## 2015-06-20 DIAGNOSIS — I10 Essential (primary) hypertension: Secondary | ICD-10-CM | POA: Diagnosis present

## 2015-06-20 DIAGNOSIS — E662 Morbid (severe) obesity with alveolar hypoventilation: Secondary | ICD-10-CM | POA: Diagnosis present

## 2015-06-20 DIAGNOSIS — E785 Hyperlipidemia, unspecified: Secondary | ICD-10-CM | POA: Diagnosis present

## 2015-06-20 DIAGNOSIS — Z8701 Personal history of pneumonia (recurrent): Secondary | ICD-10-CM

## 2015-06-20 DIAGNOSIS — J9600 Acute respiratory failure, unspecified whether with hypoxia or hypercapnia: Secondary | ICD-10-CM

## 2015-06-20 DIAGNOSIS — Z823 Family history of stroke: Secondary | ICD-10-CM

## 2015-06-20 DIAGNOSIS — K7469 Other cirrhosis of liver: Secondary | ICD-10-CM

## 2015-06-20 DIAGNOSIS — Z9981 Dependence on supplemental oxygen: Secondary | ICD-10-CM

## 2015-06-20 DIAGNOSIS — Z794 Long term (current) use of insulin: Secondary | ICD-10-CM

## 2015-06-20 DIAGNOSIS — J441 Chronic obstructive pulmonary disease with (acute) exacerbation: Secondary | ICD-10-CM | POA: Diagnosis present

## 2015-06-20 DIAGNOSIS — K3184 Gastroparesis: Secondary | ICD-10-CM | POA: Diagnosis present

## 2015-06-20 DIAGNOSIS — Z87891 Personal history of nicotine dependence: Secondary | ICD-10-CM

## 2015-06-20 DIAGNOSIS — Z833 Family history of diabetes mellitus: Secondary | ICD-10-CM

## 2015-06-20 DIAGNOSIS — N189 Chronic kidney disease, unspecified: Secondary | ICD-10-CM | POA: Diagnosis present

## 2015-06-20 DIAGNOSIS — N182 Chronic kidney disease, stage 2 (mild): Secondary | ICD-10-CM | POA: Diagnosis present

## 2015-06-20 DIAGNOSIS — K761 Chronic passive congestion of liver: Secondary | ICD-10-CM | POA: Diagnosis present

## 2015-06-20 DIAGNOSIS — G4733 Obstructive sleep apnea (adult) (pediatric): Secondary | ICD-10-CM | POA: Diagnosis present

## 2015-06-20 DIAGNOSIS — I5043 Acute on chronic combined systolic (congestive) and diastolic (congestive) heart failure: Principal | ICD-10-CM | POA: Diagnosis present

## 2015-06-20 DIAGNOSIS — J9622 Acute and chronic respiratory failure with hypercapnia: Secondary | ICD-10-CM | POA: Diagnosis not present

## 2015-06-20 DIAGNOSIS — E1143 Type 2 diabetes mellitus with diabetic autonomic (poly)neuropathy: Secondary | ICD-10-CM | POA: Diagnosis present

## 2015-06-20 DIAGNOSIS — IMO0001 Reserved for inherently not codable concepts without codable children: Secondary | ICD-10-CM

## 2015-06-20 DIAGNOSIS — Z8249 Family history of ischemic heart disease and other diseases of the circulatory system: Secondary | ICD-10-CM

## 2015-06-20 DIAGNOSIS — Z6841 Body Mass Index (BMI) 40.0 and over, adult: Secondary | ICD-10-CM

## 2015-06-20 LAB — CBC
HEMATOCRIT: 29.9 % — AB (ref 36.0–46.0)
Hemoglobin: 8.6 g/dL — ABNORMAL LOW (ref 12.0–15.0)
MCH: 21.7 pg — AB (ref 26.0–34.0)
MCHC: 28.8 g/dL — ABNORMAL LOW (ref 30.0–36.0)
MCV: 75.5 fL — ABNORMAL LOW (ref 78.0–100.0)
PLATELETS: 173 10*3/uL (ref 150–400)
RBC: 3.96 MIL/uL (ref 3.87–5.11)
RDW: 19.7 % — ABNORMAL HIGH (ref 11.5–15.5)
WBC: 8.9 10*3/uL (ref 4.0–10.5)

## 2015-06-20 LAB — CBG MONITORING, ED: Glucose-Capillary: 86 mg/dL (ref 65–99)

## 2015-06-20 MED ORDER — ALBUTEROL SULFATE (2.5 MG/3ML) 0.083% IN NEBU
5.0000 mg | INHALATION_SOLUTION | Freq: Once | RESPIRATORY_TRACT | Status: AC
  Administered 2015-06-20: 5 mg via RESPIRATORY_TRACT
  Filled 2015-06-20: qty 6

## 2015-06-20 NOTE — ED Notes (Signed)
Current CBG 86

## 2015-06-20 NOTE — ED Notes (Signed)
Pt presents with [facility] from home where family called 911 due to patient being unresponsive; pt found to have 2 insulin pens at side; family reports unknown if patient ate; initial CBG with EMS was 40, 1mg  glucagon IM given, second CBG with EMS was 48; pt became arousable; oral glucose then given; current CBG is 86 in ER; pt also noted to have wheezing in all lung fields so duo neb given by EMS also; pt CAOx4 at this time and VSS

## 2015-06-20 NOTE — ED Provider Notes (Signed)
CSN: [id number]     Arrival date & time 06/20/15  2245 History  By signing my name below, I, Julien Nordmann, attest that this documentation has been prepared under the direction and in the presence of Lacretia Leigh, MD. Electronically Signed: Julien Nordmann, ED Scribe. 06/20/2015. 11:20 PM.    Chief Complaint  Patient presents with  . Hypoglycemia  . Wheezing      HPI Comments: Pt denies emesis and has a h/o chf and this is similar  The history is provided by the patient. No language interpreter was used.   HPI Comments: Tamara Silva is a 55 y.o. female who has a hx of CHF presents to the Emergency Department complaining of sudden onset hypoglycemia with associated wheezing that started this evening. Pt notes she has been having a cough for the past 3 days with shortness of breath upon laying down and ambulating. Pt reports she believe she took too much of her medication. Per family member, pt ate dinner, took her medication, and then she was unresponsive. Per EMS, pt was given 1 mg glucagon and duoneb en route. Denies vomiting, fever, and chest pain.  Past Medical History  Diagnosis Date  . Diabetes mellitus without complication ([place])   . Asthma    Past Surgical History  Procedure Laterality Date  . Coronary angioplasty with stent placement      CAD in 2006 x 2 and 2009 2 more- place din REx in [place] and [place] med  . Esophagogastroduodenoscopy (egd) with propofol N/A 08/03/2013    Procedure: ESOPHAGOGASTRODUODENOSCOPY (EGD) WITH PROPOFOL;  Surgeon: Jeryl Columbia, MD;  Location: [facility];  Service: Endoscopy;  Laterality: N/A;  . Colonoscopy with propofol N/A 08/03/2013    Procedure: COLONOSCOPY WITH PROPOFOL;  Surgeon: Jeryl Columbia, MD;  Location: [facility];  Service: Endoscopy;  Laterality: N/A;  . Coronary angioplasty with stent placement  10/07/2013    Xience Alpine DES 2.75  mm x 15  mm  . Left heart catheterization with coronary angiogram N/A 10/07/2013    Procedure: LEFT  HEART CATHETERIZATION WITH CORONARY ANGIOGRAM;  Surgeon: Leonie Man, MD;  [facility];  Service: Cardiovascular;  Laterality: N/A;   Family History  Problem Relation Age of Onset  . Stroke Mother   . Hypertension Mother   . Colon cancer Father   . Diabetes Father   . Heart attack Father   . Breast cancer Paternal Aunt   . Emphysema Brother   . Lung disease Brother     Unknown type, 3 brothers, one with liver and lung disease  . Asthma Paternal Aunt   . Sarcoidosis Other    Social History  Substance Use Topics  . Smoking status: Former Smoker -- 0.50 packs/day for 20 years    Types: Cigarettes    Quit date: 04/30/2014  . Smokeless tobacco: Never Used     Comment: smoked off and on x 20 years  . Alcohol Use: No   OB History    No data available     Review of Systems  Constitutional: Negative for fever.  Respiratory: Positive for cough, shortness of breath and wheezing.   Cardiovascular: Positive for chest pain.  Gastrointestinal: Negative for vomiting.  All other systems reviewed and are negative.     Allergies  Metolazone; Plavix; and Nsaids  Home Medications   Prior to Admission medications   Medication Sig Start Date End Date Taking? Authorizing Provider  ACCU-CHEK AVIVA PLUS test strip USE 1 STRIP TO  CHECK GLUCOSE 3 TIMES DAILY 02/18/15   Tresa Garter, MD  ACCU-CHEK SOFTCLIX LANCETS lancets USE 1 LANCET TO CHECK GLUCOSE 3 TIMES DAILY 02/18/15   Tresa Garter, MD  acetaminophen-codeine (TYLENOL #3) 300-30 MG per tablet Take 1 tablet by mouth every 4 (four) hours as needed. Patient not taking: Reported on 02/21/2015 10/31/14   Tresa Garter, MD  aspirin 81 MG tablet Take 1 tablet (81 mg total) by mouth daily. 10/09/13   Rhonda G Barrett, PA-C  carvedilol (COREG) 12.5 MG tablet Take 1 tablet (12.5 mg total) by mouth 2 (two) times daily with a meal. 05/26/15   Robbie Lis, MD  cycloSPORINE (RESTASIS) 0.05 % ophthalmic emulsion Place 2  drops into both eyes 2 (two) times daily as needed (for dry eyes).     Historical Provider, MD  EASY TOUCH PEN NEEDLES 31G X 8 MM MISC USE AS DIRECTED TO INJECT INSULIN 02/18/15   Tresa Garter, MD  EFFIENT 10 MG TABS tablet TAKE ONE TABLET BY MOUTH DAILY. 03/18/15   Jolaine Artist, MD  feeding supplement (BOOST / RESOURCE BREEZE) LIQD Take 1 Container by mouth 3 (three) times daily between meals. 05/26/15   Robbie Lis, MD  fluticasone (FLONASE) 50 MCG/ACT nasal spray USE 2 SPRAYS IN EACH NOSTRIL TWICE DAILY 04/21/15   Tresa Garter, MD  furosemide (LASIX) 40 MG tablet TAKE 2 TABLETS BY MOUTH IN THE MORNING AND THEN TAKE 1 TABLET IN THE EVENING 04/21/15   Tresa Garter, MD  Glucose Blood (ACCU-CHEK ADVANTAGE TEST VI) 1 each by Other route See admin instructions. Check blood sugar 4 times daily.    Historical Provider, MD  insulin aspart (NOVOLOG) 100 UNIT/ML injection Inject 15-20 Units into the skin 3 (three) times daily before meals. Sliding scale 09/16/14   Tresa Garter, MD  insulin detemir (LEVEMIR) 100 UNIT/ML injection Inject 0.45 mLs (45 Units total) into the skin at bedtime. 05/26/15   Robbie Lis, MD  INSULIN SYRINGE .5CC/29G 29G X 1/2" 0.5 ML MISC Use as instructed by physician 03/28/14   Tresa Garter, MD  ipratropium-albuterol (DUONEB) 0.5-2.5 (3) MG/3ML SOLN Take 3 mLs by nebulization every 6 (six) hours as needed. Patient taking differently: Take 3 mLs by nebulization every 6 (six) hours as needed (wheezing and shortness of breath).  09/16/14   Tresa Garter, MD  levofloxacin (LEVAQUIN) 750 MG tablet Take 1 tablet (750 mg total) by mouth daily. 05/26/15   Robbie Lis, MD  nitroGLYCERIN (NITROSTAT) 0.4 MG SL tablet Place 1 tablet (0.4 mg total) under the tongue every 5 (five) minutes as needed for chest pain. MUST MAKE APPOINTMENT FOR FUTURE REFILLS 05/20/15   Larey Dresser, MD  omeprazole (PRILOSEC) 40 MG capsule TAKE 1 CAPSULE BY MOUTH TWICE  DAILY 04/21/15   Tresa Garter, MD  ondansetron (ZOFRAN ODT) 8 MG disintegrating tablet Take 1 tablet (8 mg total) by mouth every 8 (eight) hours as needed for nausea or vomiting. 02/21/15   Jola Schmidt, MD  Paliperidone Palmitate (INVEGA SUSTENNA) 117 MG/0.75ML SUSP Inject 156 mg into the muscle every 3 (three) months.     Historical Provider, MD  pravastatin (PRAVACHOL) 80 MG tablet Take 1 tablet (80 mg total) by mouth every evening. 05/26/15   Robbie Lis, MD  prazosin (MINIPRESS) 5 MG capsule TAKE 1 CAPSULE BY MOUTH EVERY NIGHT AT BEDTIME FOR NIGHTMARES Patient taking differently: TAKE 1 CAPSULE BY MOUTH EVERY NIGHT AT BEDTIME  AS NEEDED FOR NIGHTMARES 04/21/15   Tresa Garter, MD  predniSONE (DELTASONE) 5 MG tablet Take 10 tablets (50 mg total) by mouth daily with breakfast. 05/26/15   Robbie Lis, MD  PROAIR HFA 108 (90 BASE) MCG/ACT inhaler INHALE 2 PUFFS BY MOUTH 4 TIMES DAILY AS NEEDED 04/21/15   Tresa Garter, MD  promethazine (PHENERGAN) 25 MG tablet Take 1 tablet (25 mg total) by mouth every 6 (six) hours as needed for nausea or vomiting. 02/21/15   Jola Schmidt, MD  spironolactone (ALDACTONE) 25 MG tablet Take 1 tablet (25 mg total) by mouth daily. 10/31/14   Tresa Garter, MD   Triage vitals: BP 103/60 mmHg  Pulse 72  Temp(Src) 97.4 F (36.3 C) (Oral)  Resp 28  Ht 5' 7.5" (1.715 m)  Wt 270 lb (122.471 kg)  BMI 41.64 kg/m2  SpO2 99%  LMP 05/30/2013 Physical Exam  Constitutional: She is oriented to person, place, and time. She appears well-developed and well-nourished.  Non-toxic appearance. No distress.  HENT:  Head: Normocephalic and atraumatic.  Eyes: Conjunctivae, EOM and lids are normal. Pupils are equal, round, and reactive to light.  Neck: Normal range of motion. Neck supple. No tracheal deviation present. No thyroid mass present.  Cardiovascular: Normal rate, regular rhythm and normal heart sounds.  Exam reveals no gallop.   No murmur  heard. Pulmonary/Chest: Effort normal and breath sounds normal. No stridor. No respiratory distress. She has no decreased breath sounds. She has no wheezes. She has no rhonchi. She has no rales.  Rhonchi bilaterally  Abdominal: Soft. Normal appearance and bowel sounds are normal. She exhibits no distension. There is no tenderness. There is no rebound and no CVA tenderness.  Musculoskeletal: Normal range of motion. She exhibits edema. She exhibits no tenderness.  3+ pitting edema in lower extremities  Neurological: She is alert and oriented to person, place, and time. She has normal strength. No cranial nerve deficit or sensory deficit. GCS eye subscore is 4. GCS verbal subscore is 5. GCS motor subscore is 6.  Skin: Skin is warm and dry. No abrasion and no rash noted.  Psychiatric: She has a normal mood and affect. Her speech is normal and behavior is normal.  Nursing note and vitals reviewed.   ED Course  Procedures  DIAGNOSTIC STUDIES: Oxygen Saturation is 99% on RA, normal by my interpretation.  COORDINATION OF CARE:  11:19 PM Discussed treatment plan which includes chest x-ray and admit to the hospital with pt at bedside and pt agreed to plan.  Labs Review Labs Reviewed  COMPREHENSIVE METABOLIC PANEL  CBC  CBG MONITORING, ED    Imaging Review No results found. I have personally reviewed and evaluated these images and lab results as part of my medical decision-making.   EKG Interpretation   Date/Time:  Friday June 20 2015 22:58:48 EST Ventricular Rate:  72 PR Interval:  151 QRS Duration: 97 QT Interval:  461 QTC Calculation: 505 R Axis:   68 Text Interpretation:  Sinus rhythm Ventricular premature complex Aberrant  complex Consider anterior infarct No significant change since last tracing  Confirmed by Dalon Reichart  MD, Orma Cheetham ([id number]) on 06/20/2015 11:32:01 PM      MDM   Final diagnoses:  None   I personally performed the services described in this documentation,  which was scribed in my presence. The recorded information has been reviewed and is accurate.   Patient had a repeat blood sugar which was about 100. Was given Lasix for  her CHF symptoms. Will be admitted to the medicine service  Lacretia Leigh, MD 06/21/15 [phone]

## 2015-06-21 ENCOUNTER — Inpatient Hospital Stay (HOSPITAL_COMMUNITY): Payer: Medicaid Other

## 2015-06-21 ENCOUNTER — Encounter (HOSPITAL_COMMUNITY): Payer: Self-pay | Admitting: Family Medicine

## 2015-06-21 ENCOUNTER — Emergency Department (HOSPITAL_COMMUNITY): Payer: Medicaid Other

## 2015-06-21 DIAGNOSIS — Z9861 Coronary angioplasty status: Secondary | ICD-10-CM

## 2015-06-21 DIAGNOSIS — N182 Chronic kidney disease, stage 2 (mild): Secondary | ICD-10-CM | POA: Diagnosis not present

## 2015-06-21 DIAGNOSIS — I5043 Acute on chronic combined systolic (congestive) and diastolic (congestive) heart failure: Secondary | ICD-10-CM | POA: Diagnosis not present

## 2015-06-21 DIAGNOSIS — E162 Hypoglycemia, unspecified: Secondary | ICD-10-CM | POA: Diagnosis not present

## 2015-06-21 DIAGNOSIS — D631 Anemia in chronic kidney disease: Secondary | ICD-10-CM

## 2015-06-21 DIAGNOSIS — F310 Bipolar disorder, current episode hypomanic: Secondary | ICD-10-CM

## 2015-06-21 DIAGNOSIS — Z7901 Long term (current) use of anticoagulants: Secondary | ICD-10-CM

## 2015-06-21 DIAGNOSIS — I2510 Atherosclerotic heart disease of native coronary artery without angina pectoris: Secondary | ICD-10-CM

## 2015-06-21 DIAGNOSIS — I2782 Chronic pulmonary embolism: Secondary | ICD-10-CM

## 2015-06-21 LAB — BRAIN NATRIURETIC PEPTIDE: B Natriuretic Peptide: 293.5 pg/mL — ABNORMAL HIGH (ref 0.0–100.0)

## 2015-06-21 LAB — CBG MONITORING, ED: GLUCOSE-CAPILLARY: 107 mg/dL — AB (ref 65–99)

## 2015-06-21 LAB — COMPREHENSIVE METABOLIC PANEL
ALBUMIN: 2.6 g/dL — AB (ref 3.5–5.0)
ALK PHOS: 65 U/L (ref 38–126)
ALT: 17 U/L (ref 14–54)
AST: 18 U/L (ref 15–41)
Anion gap: 7 (ref 5–15)
BILIRUBIN TOTAL: 0.3 mg/dL (ref 0.3–1.2)
BUN: 10 mg/dL (ref 6–20)
CALCIUM: 8.1 mg/dL — AB (ref 8.9–10.3)
CO2: 29 mmol/L (ref 22–32)
Chloride: 102 mmol/L (ref 101–111)
Creatinine, Ser: 1.02 mg/dL — ABNORMAL HIGH (ref 0.44–1.00)
GFR calc Af Amer: >60 mL/min (ref >60)
GFR calc non Af Amer: >60 mL/min (ref >60)
GLUCOSE: 79 mg/dL (ref 65–99)
Potassium: 4.6 mmol/L (ref 3.5–5.1)
SODIUM: 138 mmol/L (ref 135–145)
TOTAL PROTEIN: 6.8 g/dL (ref 6.5–8.1)

## 2015-06-21 LAB — GLUCOSE, CAPILLARY
GLUCOSE-CAPILLARY: 122 mg/dL — AB (ref 65–99)
GLUCOSE-CAPILLARY: 83 mg/dL (ref 65–99)
Glucose-Capillary: 124 mg/dL — ABNORMAL HIGH (ref 65–99)
Glucose-Capillary: 152 mg/dL — ABNORMAL HIGH (ref 65–99)
Glucose-Capillary: 181 mg/dL — ABNORMAL HIGH (ref 65–99)
Glucose-Capillary: 184 mg/dL — ABNORMAL HIGH (ref 65–99)
Glucose-Capillary: 252 mg/dL — ABNORMAL HIGH (ref 65–99)

## 2015-06-21 LAB — TROPONIN I: Troponin I: 0.05 ng/mL — ABNORMAL HIGH (ref <0.031)

## 2015-06-21 MED ORDER — FUROSEMIDE 10 MG/ML IJ SOLN
80.0000 mg | Freq: Once | INTRAMUSCULAR | Status: AC
  Administered 2015-06-21: 80 mg via INTRAVENOUS
  Filled 2015-06-21: qty 8

## 2015-06-21 MED ORDER — FERROUS SULFATE 325 (65 FE) MG PO TABS
325.0000 mg | ORAL_TABLET | Freq: Two times a day (BID) | ORAL | Status: DC
  Administered 2015-06-21 (×2): 325 mg via ORAL
  Filled 2015-06-21 (×2): qty 1

## 2015-06-21 MED ORDER — ALBUTEROL SULFATE (2.5 MG/3ML) 0.083% IN NEBU
2.5000 mg | INHALATION_SOLUTION | Freq: Four times a day (QID) | RESPIRATORY_TRACT | Status: DC
  Administered 2015-06-21 – 2015-06-22 (×7): 2.5 mg via RESPIRATORY_TRACT
  Filled 2015-06-21 (×7): qty 3

## 2015-06-21 MED ORDER — FUROSEMIDE 10 MG/ML IJ SOLN
40.0000 mg | Freq: Every day | INTRAMUSCULAR | Status: DC
  Administered 2015-06-21 – 2015-06-24 (×4): 40 mg via INTRAVENOUS
  Filled 2015-06-21 (×5): qty 4

## 2015-06-21 MED ORDER — SPIRONOLACTONE 25 MG PO TABS
25.0000 mg | ORAL_TABLET | Freq: Every day | ORAL | Status: DC
  Administered 2015-06-21: 25 mg via ORAL
  Filled 2015-06-21: qty 1

## 2015-06-21 MED ORDER — CARVEDILOL 12.5 MG PO TABS
12.5000 mg | ORAL_TABLET | Freq: Two times a day (BID) | ORAL | Status: DC
  Administered 2015-06-22: 12.5 mg via ORAL
  Filled 2015-06-21: qty 1

## 2015-06-21 MED ORDER — PRASUGREL HCL 10 MG PO TABS
10.0000 mg | ORAL_TABLET | Freq: Every day | ORAL | Status: DC
  Administered 2015-06-21 – 2015-06-29 (×9): 10 mg via ORAL
  Filled 2015-06-21 (×10): qty 1

## 2015-06-21 MED ORDER — FERROUS SULFATE 325 (65 FE) MG PO TABS
325.0000 mg | ORAL_TABLET | Freq: Two times a day (BID) | ORAL | Status: DC
  Administered 2015-06-22: 325 mg via ORAL
  Filled 2015-06-21: qty 1

## 2015-06-21 MED ORDER — PRAZOSIN HCL 5 MG PO CAPS
5.0000 mg | ORAL_CAPSULE | Freq: Every day | ORAL | Status: DC
  Administered 2015-06-21 (×2): 5 mg via ORAL
  Filled 2015-06-21 (×3): qty 1

## 2015-06-21 MED ORDER — CARVEDILOL 12.5 MG PO TABS
12.5000 mg | ORAL_TABLET | Freq: Two times a day (BID) | ORAL | Status: DC
  Administered 2015-06-21 (×2): 12.5 mg via ORAL
  Filled 2015-06-21 (×2): qty 1

## 2015-06-21 MED ORDER — PANTOPRAZOLE SODIUM 40 MG PO TBEC
80.0000 mg | DELAYED_RELEASE_TABLET | Freq: Every day | ORAL | Status: DC
  Administered 2015-06-21 – 2015-06-22 (×2): 80 mg via ORAL
  Filled 2015-06-21 (×2): qty 2

## 2015-06-21 MED ORDER — PRAVASTATIN SODIUM 40 MG PO TABS
80.0000 mg | ORAL_TABLET | Freq: Every evening | ORAL | Status: DC
  Administered 2015-06-21: 80 mg via ORAL
  Filled 2015-06-21: qty 2

## 2015-06-21 MED ORDER — ENOXAPARIN SODIUM 40 MG/0.4ML ~~LOC~~ SOLN
40.0000 mg | Freq: Every day | SUBCUTANEOUS | Status: DC
  Administered 2015-06-21 – 2015-06-25 (×5): 40 mg via SUBCUTANEOUS
  Filled 2015-06-21 (×5): qty 0.4

## 2015-06-21 MED ORDER — ALBUTEROL SULFATE (2.5 MG/3ML) 0.083% IN NEBU
2.5000 mg | INHALATION_SOLUTION | RESPIRATORY_TRACT | Status: DC | PRN
  Administered 2015-06-21 – 2015-06-22 (×5): 2.5 mg via RESPIRATORY_TRACT
  Filled 2015-06-21 (×5): qty 3

## 2015-06-21 MED ORDER — ACETAMINOPHEN 500 MG PO TABS
500.0000 mg | ORAL_TABLET | Freq: Four times a day (QID) | ORAL | Status: DC | PRN
  Administered 2015-06-21 (×2): 500 mg via ORAL
  Filled 2015-06-21 (×2): qty 1

## 2015-06-21 MED ORDER — SODIUM CHLORIDE 0.9 % IJ SOLN
3.0000 mL | Freq: Two times a day (BID) | INTRAMUSCULAR | Status: DC
  Administered 2015-06-21 – 2015-06-25 (×9): 3 mL via INTRAVENOUS

## 2015-06-21 MED ORDER — ASPIRIN EC 81 MG PO TBEC
81.0000 mg | DELAYED_RELEASE_TABLET | Freq: Every day | ORAL | Status: DC
  Administered 2015-06-21 – 2015-06-29 (×9): 81 mg via ORAL
  Filled 2015-06-21 (×10): qty 1

## 2015-06-21 MED ORDER — ALPRAZOLAM 0.5 MG PO TABS
0.5000 mg | ORAL_TABLET | Freq: Three times a day (TID) | ORAL | Status: DC
  Administered 2015-06-21 (×3): 0.5 mg via ORAL
  Filled 2015-06-21 (×4): qty 1

## 2015-06-21 MED ORDER — DOCUSATE SODIUM 100 MG PO CAPS
100.0000 mg | ORAL_CAPSULE | Freq: Every day | ORAL | Status: DC
  Administered 2015-06-21 – 2015-06-22 (×2): 100 mg via ORAL
  Filled 2015-06-21 (×2): qty 1

## 2015-06-21 NOTE — Progress Notes (Signed)
8:08 AM I agree with HPI/GPe and A/P per Dr. Loleta Books  55 y/o ?  IDDM Ssy/Diastolic HF NYHA class 2; EF 30-35% COPD on Home O2 CHr PE 10/21/12  on ASA and effient CAD with PCI x 2 2006 & 2009-STEMI 10/09/13 DM ty II Htn Gastroparesis s/p colonoscopy 08/04/2013  BIpolar      Admitted with Iatrogenic low CBg's and SOb /AECHF  HEENT anxious ths am.  Felt SOB and wehezy and Resp called O2 sats 100 % CHEST wheezy posterioryl with ralesno CW tenderness, cannot assess JVD 2/2 to habitu ABDOMEN obese nt nd no rebound NEURO intact, moving all 4 limbs SKIN/MUSCULAR grade 2 dd  Patient Active Problem List   Diagnosis Date Noted  . Acute on chronic combined systolic (congestive) and diastolic (congestive) heart failure ([place]) 06/21/2015  . Hypoglycemia 06/21/2015  . Uncontrolled type 2 diabetes mellitus without complication, with long-term current use of insulin ([place])   . Dyslipidemia associated with type 2 diabetes mellitus ([place]) 05/21/2015  . Anemia of chronic kidney failure 05/21/2015  . Hyperkalemia 05/21/2015  . COPD GOLD III  06/10/2014  . Acute respiratory failure with hypoxia ([place]) 03/04/2014  . CKD (chronic kidney disease), stage II 02/07/2014  . COPD exacerbation ([place]) 02/01/2014  . CAD S/P percutaneous coronary angioplasty - prior PCI to LAD; RCA PCI: new Xience Alpine DES 2.75 mm x 15 mm  10/07/2013  . Morbid obesity due to excess calories ([place]) 07/30/2013  . Chronic combined systolic and diastolic congestive heart failure, NYHA class 2; EF 30-35% 07/10/2013  . Chronic pulmonary embolism ([place]) 02/08/2013  . Bipolar disorder ([place]) 02/08/2013  . Essential hypertension 02/08/2013  . Chronic anticoagulation 11/27/2012   We will diuresis patient she tells me that she has been in the 260 range in terms of weight but has recently gone up to about 290 pounds. She states poor compliance with Lasix Her blood sugars are better controlled now We'll repeat labs in the morning For her  anxiety added Xanax 1 mg 3 times a day although we will only uses short-term We will keep her on telemetry overnight and monitor Rest as history of present illness  Verneita Griffes, MD [facility] [phone]

## 2015-06-21 NOTE — H&P (Signed)
History and Physical  Patient Name: Tamara Silva     [id number]    DOB: [birth year]    DOA: 06/20/2015 Referring physician: Vivi Martens, MD PCP: Angelica Chessman, MD      Chief Complaint: Unintentional insulin overdose  HPI: Tamara Silva is a 55 y.o. female with a past medical history significant for IDDM, chronic systolic and diastolic CHF from ischemic CM last EF 40%, COPD on home O2 intermittently, chronic PE not on warfarin, IDDM, and Bipolar who presents with hypoglycemia.  The patient describes to me that she "probably took too much insulin" today, but didn't realize until tonight. She did not intend to do this, but after she had administered her Lantus and NovoLog this evening she became shaky, sweaty, confused, and then passed out. Her boyfriend found her and called 911. EMS arrived they found her with a glucose of 16 mg/dL and unresponsive. They administered glucagon and transported her to the hospital.  In the ED, her blood sugar was 100, and electrolytes were normal. She was groggy and lethargic and complained of some dyspnea.  During her workup, she was noted incidentally have elevated BNP, minimally elevated TNI, and pulmonary edema appearing on chest x-ray.  TRH were asked to evaluate for admission.     Review of Systems:  Pt complains of shakiness, sweating, confusion after taking too much insulin. Now she feels weak, tired, and has trouble breathing. Pt denies any fever, chills, wheezing, chest tightness, chest pain.  All other systems negative except as just noted or noted in the history of present illness.  Allergies  Allergen Reactions  . Metolazone Other (See Comments)    Dizziness and falling  . Plavix [Clopidogrel Bisulfate] Other (See Comments)    High PRU's - non-responder  . Nsaids Other (See Comments)    "I do not take NSAIDs d/t interfering with other meds"    Prior to Admission medications   Medication Sig Start Date End Date Taking? Authorizing  Provider  ACCU-CHEK AVIVA PLUS test strip USE 1 STRIP TO CHECK GLUCOSE 3 TIMES DAILY 02/18/15  Yes Tresa Garter, MD  ACCU-CHEK SOFTCLIX LANCETS lancets USE 1 LANCET TO CHECK GLUCOSE 3 TIMES DAILY 02/18/15  Yes Tresa Garter, MD  aspirin 81 MG tablet Take 1 tablet (81 mg total) by mouth daily. 10/09/13  Yes Rhonda G Barrett, PA-C  carvedilol (COREG) 12.5 MG tablet Take 1 tablet (12.5 mg total) by mouth 2 (two) times daily with a meal. 05/26/15  Yes Robbie Lis, MD  cycloSPORINE (RESTASIS) 0.05 % ophthalmic emulsion Place 2 drops into both eyes 2 (two) times daily as needed (for dry eyes).    Yes Historical Provider, MD  EASY TOUCH PEN NEEDLES 31G X 8 MM MISC USE AS DIRECTED TO INJECT INSULIN 02/18/15  Yes Olugbemiga Essie Christine, MD  EFFIENT 10 MG TABS tablet TAKE ONE TABLET BY MOUTH DAILY. 03/18/15  Yes Shaune Pascal Bensimhon, MD  fluticasone (FLONASE) 50 MCG/ACT nasal spray USE 2 SPRAYS IN EACH NOSTRIL TWICE DAILY 04/21/15  Yes Olugbemiga E Doreene Burke, MD  furosemide (LASIX) 40 MG tablet TAKE 2 TABLETS BY MOUTH IN THE MORNING AND THEN TAKE 1 TABLET IN THE EVENING 04/21/15  Yes Olugbemiga E Doreene Burke, MD  insulin aspart (NOVOLOG) 100 UNIT/ML injection Inject 15-20 Units into the skin 3 (three) times daily before meals. Sliding scale 09/16/14  Yes Olugbemiga E Doreene Burke, MD  insulin detemir (LEVEMIR) 100 UNIT/ML injection Inject 0.45 mLs (45 Units total) into the skin at bedtime. 05/26/15  Yes Robbie Lis, MD  INSULIN SYRINGE .5CC/29G 29G X 1/2" 0.5 ML MISC Use as instructed by physician 03/28/14  Yes Tresa Garter, MD  ipratropium-albuterol (DUONEB) 0.5-2.5 (3) MG/3ML SOLN Take 3 mLs by nebulization every 6 (six) hours as needed. Patient taking differently: Take 3 mLs by nebulization every 6 (six) hours as needed (wheezing and shortness of breath).  09/16/14  Yes Tresa Garter, MD  nitroGLYCERIN (NITROSTAT) 0.4 MG SL tablet Place 1 tablet (0.4 mg total) under the tongue every 5 (five) minutes as  needed for chest pain. MUST MAKE APPOINTMENT FOR FUTURE REFILLS 05/20/15  Yes Larey Dresser, MD  omeprazole (PRILOSEC) 40 MG capsule TAKE 1 CAPSULE BY MOUTH TWICE DAILY 04/21/15  Yes Tresa Garter, MD  ondansetron (ZOFRAN ODT) 8 MG disintegrating tablet Take 1 tablet (8 mg total) by mouth every 8 (eight) hours as needed for nausea or vomiting. 02/21/15  Yes Jola Schmidt, MD  Paliperidone Palmitate (INVEGA SUSTENNA) 117 MG/0.75ML SUSP Inject 156 mg into the muscle every 3 (three) months.    Yes Historical Provider, MD  pravastatin (PRAVACHOL) 80 MG tablet Take 1 tablet (80 mg total) by mouth every evening. 05/26/15  Yes Robbie Lis, MD  prazosin (MINIPRESS) 5 MG capsule TAKE 1 CAPSULE BY MOUTH EVERY NIGHT AT BEDTIME FOR NIGHTMARES Patient taking differently: TAKE 1 CAPSULE BY MOUTH EVERY NIGHT AT BEDTIME AS NEEDED FOR NIGHTMARES 04/21/15  Yes Olugbemiga E Doreene Burke, MD  PROAIR HFA 108 (90 BASE) MCG/ACT inhaler INHALE 2 PUFFS BY MOUTH 4 TIMES DAILY AS NEEDED Patient taking differently: INHALE 2 PUFFS BY MOUTH 4 TIMES DAILY AS NEEDED FOR SHORTNES OF BREATH 04/21/15  Yes Tresa Garter, MD  promethazine (PHENERGAN) 25 MG tablet Take 1 tablet (25 mg total) by mouth every 6 (six) hours as needed for nausea or vomiting. 02/21/15  Yes Jola Schmidt, MD  spironolactone (ALDACTONE) 25 MG tablet Take 1 tablet (25 mg total) by mouth daily. 10/31/14  Yes Tresa Garter, MD    Past Medical History  Diagnosis Date  . Diabetes mellitus without complication ([place])   . Asthma     Past Surgical History  Procedure Laterality Date  . Coronary angioplasty with stent placement      CAD in 2006 x 2 and 2009 2 more- place din REx in [place] and [place] med  . Esophagogastroduodenoscopy (egd) with propofol N/A 08/03/2013    Procedure: ESOPHAGOGASTRODUODENOSCOPY (EGD) WITH PROPOFOL;  Surgeon: Jeryl Columbia, MD;  Location: [facility];  Service: Endoscopy;  Laterality: N/A;  . Colonoscopy with propofol N/A  08/03/2013    Procedure: COLONOSCOPY WITH PROPOFOL;  Surgeon: Jeryl Columbia, MD;  Location: [facility];  Service: Endoscopy;  Laterality: N/A;  . Coronary angioplasty with stent placement  10/07/2013    Xience Alpine DES 2.75  mm x 15  mm  . Left heart catheterization with coronary angiogram N/A 10/07/2013    Procedure: LEFT HEART CATHETERIZATION WITH CORONARY ANGIOGRAM;  Surgeon: Leonie Man, MD;  Location: [facility] CATH LAB;  Service: Cardiovascular;  Laterality: N/A;    Family history: family history includes Asthma in her paternal aunt; Breast cancer in her paternal aunt; Colon cancer in her father; Diabetes in her father; Emphysema in her brother; Heart attack in her father; Hypertension in her mother; Lung disease in her brother; Sarcoidosis in her other; Stroke in her mother.  Social History: Patient lives with her broyfriend.  She quit smoking.  She ambulates without assistive device.  Physical Exam: BP 103/66 mmHg  Pulse 61  Temp(Src) 97.4 F (36.3 C) (Oral)  Resp 27  Ht 5' 7.5" (1.715 m)  Wt 122.471 kg (270 lb)  BMI 41.64 kg/m2  SpO2 100%  LMP 05/30/2013 General appearance: Morbidly obese adult female, sluggish but rousable and able to answer questions.   Eyes: Anicteric, conjunctiva pink, lids and lashes normal.     ENT: No nasal deformity, discharge, or epistaxis.  OP moist.   Lymph: No cervical, supraclavicular lymphadenopathy. Skin: Warm and dry.   Cardiac: RRR, nl S1-S2, no murmurs appreciated.  Capillary refill is brisk.  JVP not visible due to habitus.  Nonpitting mild LE edema bilaterally.  Radial pulses 2+ and symmetric. Respiratory: Normal respiratory rate and rhythm.  Inspiratory wheezes bilatearlly. Abdomen: Abdomen soft without rigidity.  No TTP.  MSK: No deformities or effusions. Neuro: Sleepy/sluggish, but rousable and oriented to person, place and situation.  Vague about what happened today.  Globally weak.  Speech is fluent, moves all extremities.     Psych: Affect flat.  No evidence of aural or visual hallucinations or delusions.       Labs on Admission:  The metabolic panel shows normal sodium, potassium, bicarbonate.  The serum creatinine is minimally elevated from normal. The albumin is low, but the transaminases and bilirubin are normal. The troponins minimally elevated. The BNP is elevated at 293 pg per mL. The complete blood count shows microcytic anemia, worse from last September.   Radiological Exams on Admission: Personally reviewed: Dg Chest 2 View 06/21/2015  Bilateral interstitial markings, favor edema.     EKG: Independently reviewed. Sinus rhythm, rate 72.  QTc slightly elevated at 505.  No ST TW changes.  Echocardiogram Feb 2016:  EF 40-45% Mild MR    Assessment/Plan 1. Hypoglycemia and IDDM:  This is new.  Unintentional.  Patient unclear how much she took, but at least initially tells me she took too much Novolog and Levemir both. -q4hrs accuchecks -Hold insulin for now, restart long-acting in AM if BG remains stable    2. Acute on chronic systolic and diastolic CHF: Baseline EF 40-45%.  BNP somewhat elevated and CXR with edema.   -Furosemide 80 mg IV daily -Trend BMP -Strict I/O and daily weights -Continue spironolactone, aspirin  3. COPD:  Wheezing and short of breath.  Doubt pneumonia.  Recent pneumonia and COPD flare requiring prolonged steroid taper. -Nebulized bronchodilators q4hrs and q2hrs PRN -Step up therapy if needed  4. Bipolar:  On Invega -Continue prazosin  5. CAD:  -Continue aspirin, statin, prasugrel  6. Anemia, microcytic: Presumably iron deficiency.  Ferritin 14 ng/mL in Dec. Colonoscopy in 2015 with polyps removed.  Colon cancer history in immediate family. -GI follow up on non-urgent basis -Iron supplement    DVT PPx: Lovenox Diet: Cardiac diabetic Consultants: None Code Status: Full Family Communication: Boyfriend at bedside  Medical decision making: What  exists of the patient's previous chart was reviewed in depth and the case was discussed with Dr. Zenia Resides. Patient seen 2:43 AM on 06/21/2015.  Disposition Plan:  I recommend admission to telemetry inpatient.  Status stable.  Anticipate monitor BG for 24 hours and diuresis and follow electrolytse.      Edwin Dada [facility] Pager [phone]

## 2015-06-22 ENCOUNTER — Observation Stay (HOSPITAL_COMMUNITY): Payer: Medicaid Other

## 2015-06-22 DIAGNOSIS — J9621 Acute and chronic respiratory failure with hypoxia: Secondary | ICD-10-CM | POA: Diagnosis not present

## 2015-06-22 DIAGNOSIS — J9620 Acute and chronic respiratory failure, unspecified whether with hypoxia or hypercapnia: Secondary | ICD-10-CM | POA: Diagnosis present

## 2015-06-22 DIAGNOSIS — E162 Hypoglycemia, unspecified: Secondary | ICD-10-CM | POA: Diagnosis not present

## 2015-06-22 LAB — BLOOD GAS, ARTERIAL
ACID-BASE EXCESS: 13.3 mmol/L — AB (ref 0.0–2.0)
Acid-Base Excess: 11.2 mmol/L — ABNORMAL HIGH (ref 0.0–2.0)
Bicarbonate: 38.6 mEq/L — ABNORMAL HIGH (ref 20.0–24.0)
Bicarbonate: 40.1 mEq/L — ABNORMAL HIGH (ref 20.0–24.0)
DRAWN BY: 313941
Delivery systems: POSITIVE
Drawn by: 235881
FIO2: 0.4
O2 CONTENT: 3 L/min
O2 SAT: 95.5 %
O2 SAT: 89.7 %
PATIENT TEMPERATURE: 98.6
PEEP: 8 cmH2O
PH ART: 7.289 — AB (ref 7.350–7.450)
PO2 ART: 86.9 mmHg (ref 80.0–100.0)
PRESSURE CONTROL: 14 cmH2O
Patient temperature: 99
RATE: 26 resp/min
TCO2: 41.5 mmol/L (ref 0–100)
TCO2: 42.8 mmol/L (ref 0–100)
pCO2 arterial: 92.8 mmHg (ref 35.0–45.0)
pCO2 arterial: 86.7 mmHg (ref 35.0–45.0)
pH, Arterial: 7.243 — ABNORMAL LOW (ref 7.350–7.450)
pO2, Arterial: 63.8 mmHg — ABNORMAL LOW (ref 80.0–100.0)

## 2015-06-22 LAB — RENAL FUNCTION PANEL
ANION GAP: 6 (ref 5–15)
Albumin: 2.7 g/dL — ABNORMAL LOW (ref 3.5–5.0)
BUN: 15 mg/dL (ref 6–20)
CHLORIDE: 94 mmol/L — AB (ref 101–111)
CO2: 39 mmol/L — AB (ref 22–32)
Calcium: 8.5 mg/dL — ABNORMAL LOW (ref 8.9–10.3)
Creatinine, Ser: 0.95 mg/dL (ref 0.44–1.00)
GFR calc non Af Amer: >60 mL/min (ref >60)
Glucose, Bld: 229 mg/dL — ABNORMAL HIGH (ref 65–99)
POTASSIUM: 5 mmol/L (ref 3.5–5.1)
Phosphorus: 3.4 mg/dL (ref 2.5–4.6)
Sodium: 139 mmol/L (ref 135–145)

## 2015-06-22 LAB — POCT I-STAT 3, ART BLOOD GAS (G3+)
Acid-Base Excess: 13 mmol/L — ABNORMAL HIGH (ref 0.0–2.0)
BICARBONATE: 42.7 meq/L — AB (ref 20.0–24.0)
O2 Saturation: 97 %
Patient temperature: 98.6
TCO2: 45 mmol/L (ref 0–100)
pCO2 arterial: 90.4 mmHg (ref 35.0–45.0)
pH, Arterial: 7.282 — ABNORMAL LOW (ref 7.350–7.450)
pO2, Arterial: 104 mmHg — ABNORMAL HIGH (ref 80.0–100.0)

## 2015-06-22 LAB — HEPATIC FUNCTION PANEL
ALT: 15 U/L (ref 14–54)
AST: 14 U/L — ABNORMAL LOW (ref 15–41)
Albumin: 2.6 g/dL — ABNORMAL LOW (ref 3.5–5.0)
Alkaline Phosphatase: 58 U/L (ref 38–126)
BILIRUBIN DIRECT: 0.1 mg/dL (ref 0.1–0.5)
BILIRUBIN INDIRECT: 0.2 mg/dL — AB (ref 0.3–0.9)
TOTAL PROTEIN: 6.8 g/dL (ref 6.5–8.1)
Total Bilirubin: 0.3 mg/dL (ref 0.3–1.2)

## 2015-06-22 LAB — COMPREHENSIVE METABOLIC PANEL
ALBUMIN: 2.7 g/dL — AB (ref 3.5–5.0)
ALT: 16 U/L (ref 14–54)
AST: 12 U/L — AB (ref 15–41)
Alkaline Phosphatase: 57 U/L (ref 38–126)
Anion gap: 6 (ref 5–15)
BUN: 14 mg/dL (ref 6–20)
CO2: 38 mmol/L — AB (ref 22–32)
Calcium: 8.5 mg/dL — ABNORMAL LOW (ref 8.9–10.3)
Chloride: 94 mmol/L — ABNORMAL LOW (ref 101–111)
Creatinine, Ser: 1 mg/dL (ref 0.44–1.00)
GFR calc Af Amer: >60 mL/min (ref >60)
GFR calc non Af Amer: >60 mL/min (ref >60)
GLUCOSE: 233 mg/dL — AB (ref 65–99)
Potassium: 5 mmol/L (ref 3.5–5.1)
Sodium: 138 mmol/L (ref 135–145)
Total Bilirubin: 0.5 mg/dL (ref 0.3–1.2)
Total Protein: 7.2 g/dL (ref 6.5–8.1)

## 2015-06-22 LAB — BASIC METABOLIC PANEL
Anion gap: 6 (ref 5–15)
BUN: 16 mg/dL (ref 6–20)
CALCIUM: 8.5 mg/dL — AB (ref 8.9–10.3)
CHLORIDE: 96 mmol/L — AB (ref 101–111)
CO2: 36 mmol/L — AB (ref 22–32)
Creatinine, Ser: 0.96 mg/dL (ref 0.44–1.00)
GFR calc Af Amer: >60 mL/min (ref >60)
GFR calc non Af Amer: >60 mL/min (ref >60)
GLUCOSE: 214 mg/dL — AB (ref 65–99)
Potassium: 5.4 mmol/L — ABNORMAL HIGH (ref 3.5–5.1)
Sodium: 138 mmol/L (ref 135–145)

## 2015-06-22 LAB — GLUCOSE, CAPILLARY
GLUCOSE-CAPILLARY: 174 mg/dL — AB (ref 65–99)
GLUCOSE-CAPILLARY: 200 mg/dL — AB (ref 65–99)
Glucose-Capillary: 218 mg/dL — ABNORMAL HIGH (ref 65–99)
Glucose-Capillary: 219 mg/dL — ABNORMAL HIGH (ref 65–99)
Glucose-Capillary: 190 mg/dL — ABNORMAL HIGH (ref 65–99)

## 2015-06-22 LAB — CBC
HEMATOCRIT: 31 % — AB (ref 36.0–46.0)
Hemoglobin: 8.3 g/dL — ABNORMAL LOW (ref 12.0–15.0)
MCH: 21.3 pg — ABNORMAL LOW (ref 26.0–34.0)
MCHC: 26.8 g/dL — AB (ref 30.0–36.0)
MCV: 79.7 fL (ref 78.0–100.0)
Platelets: 200 10*3/uL (ref 150–400)
RBC: 3.89 MIL/uL (ref 3.87–5.11)
RDW: 19.6 % — AB (ref 11.5–15.5)
WBC: 5.7 10*3/uL (ref 4.0–10.5)

## 2015-06-22 LAB — URINALYSIS, ROUTINE W REFLEX MICROSCOPIC
Bilirubin Urine: NEGATIVE
Glucose, UA: NEGATIVE mg/dL
Hgb urine dipstick: NEGATIVE
Ketones, ur: NEGATIVE mg/dL
Leukocytes, UA: NEGATIVE
NITRITE: NEGATIVE
Protein, ur: NEGATIVE mg/dL
SPECIFIC GRAVITY, URINE: 1.007 (ref 1.005–1.030)
pH: 5 (ref 5.0–8.0)

## 2015-06-22 LAB — MRSA PCR SCREENING: MRSA by PCR: NEGATIVE

## 2015-06-22 LAB — PROCALCITONIN: Procalcitonin: 0.4 ng/mL

## 2015-06-22 LAB — PROTIME-INR
INR: 1.24 (ref 0.00–1.49)
Prothrombin Time: 15.7 seconds — ABNORMAL HIGH (ref 11.6–15.2)

## 2015-06-22 LAB — AMMONIA: AMMONIA: 72 umol/L — AB (ref 9–35)

## 2015-06-22 LAB — LACTIC ACID, PLASMA: Lactic Acid, Venous: 0.7 mmol/L (ref 0.5–2.0)

## 2015-06-22 LAB — MAGNESIUM: Magnesium: 1.9 mg/dL (ref 1.7–2.4)

## 2015-06-22 MED ORDER — ALPRAZOLAM 0.5 MG PO TABS: 0.5000 mg | ORAL_TABLET | Freq: Every day | ORAL | Status: DC

## 2015-06-22 MED ORDER — IPRATROPIUM-ALBUTEROL 0.5-2.5 (3) MG/3ML IN SOLN
3.0000 mL | RESPIRATORY_TRACT | Status: DC
  Administered 2015-06-22 – 2015-06-24 (×12): 3 mL via RESPIRATORY_TRACT
  Filled 2015-06-22 (×12): qty 3

## 2015-06-22 MED ORDER — DEXTROSE 5 % IV SOLN
1.0000 g | INTRAVENOUS | Status: AC
  Administered 2015-06-23 – 2015-06-26 (×4): 1 g via INTRAVENOUS
  Filled 2015-06-22 (×5): qty 10

## 2015-06-22 MED ORDER — DEXTROSE 5 % IV SOLN
500.0000 mg | INTRAVENOUS | Status: DC
  Administered 2015-06-22 – 2015-06-23 (×2): 500 mg via INTRAVENOUS
  Filled 2015-06-22 (×4): qty 500

## 2015-06-22 MED ORDER — PALIPERIDONE PALMITATE 117 MG/0.75ML IM SUSP: 156.0000 mg | INTRAMUSCULAR | Status: DC

## 2015-06-22 MED ORDER — METHYLPREDNISOLONE SODIUM SUCC 125 MG IJ SOLR
60.0000 mg | Freq: Three times a day (TID) | INTRAMUSCULAR | Status: DC
  Administered 2015-06-22 – 2015-06-24 (×6): 60 mg via INTRAVENOUS
  Filled 2015-06-22 (×6): qty 2

## 2015-06-22 MED ORDER — FUROSEMIDE 10 MG/ML IJ SOLN
80.0000 mg | Freq: Once | INTRAMUSCULAR | Status: AC
  Administered 2015-06-22: 80 mg via INTRAVENOUS
  Filled 2015-06-22: qty 8

## 2015-06-22 MED ORDER — FUROSEMIDE 10 MG/ML IJ SOLN
40.0000 mg | Freq: Once | INTRAMUSCULAR | Status: AC
  Administered 2015-06-22: 40 mg via INTRAVENOUS
  Filled 2015-06-22: qty 4

## 2015-06-22 MED ORDER — LACTULOSE 10 GM/15ML PO SOLN
20.0000 g | Freq: Two times a day (BID) | ORAL | Status: DC
  Administered 2015-06-22 – 2015-06-23 (×3): 20 g via ORAL
  Filled 2015-06-22 (×7): qty 30

## 2015-06-22 MED ORDER — PANTOPRAZOLE SODIUM 40 MG IV SOLR
40.0000 mg | Freq: Two times a day (BID) | INTRAVENOUS | Status: DC
  Administered 2015-06-22 – 2015-06-24 (×4): 40 mg via INTRAVENOUS
  Filled 2015-06-22 (×4): qty 40

## 2015-06-22 MED ORDER — INSULIN DETEMIR 100 UNIT/ML ~~LOC~~ SOLN
35.0000 [IU] | Freq: Every day | SUBCUTANEOUS | Status: DC
  Filled 2015-06-22: qty 0.35

## 2015-06-22 MED ORDER — METOPROLOL TARTRATE 1 MG/ML IV SOLN
5.0000 mg | Freq: Four times a day (QID) | INTRAVENOUS | Status: DC
  Administered 2015-06-22: 5 mg via INTRAVENOUS
  Filled 2015-06-22 (×2): qty 5

## 2015-06-22 MED ORDER — INSULIN ASPART 100 UNIT/ML ~~LOC~~ SOLN
0.0000 [IU] | SUBCUTANEOUS | Status: DC
  Administered 2015-06-22 (×2): 3 [IU] via SUBCUTANEOUS
  Administered 2015-06-23: 11 [IU] via SUBCUTANEOUS
  Administered 2015-06-23: 3 [IU] via SUBCUTANEOUS
  Administered 2015-06-23: 5 [IU] via SUBCUTANEOUS
  Administered 2015-06-23 (×2): 3 [IU] via SUBCUTANEOUS
  Administered 2015-06-23: 5 [IU] via SUBCUTANEOUS
  Administered 2015-06-24: 11 [IU] via SUBCUTANEOUS
  Administered 2015-06-24: 15 [IU] via SUBCUTANEOUS

## 2015-06-22 MED ORDER — LACTULOSE ENEMA
300.0000 mL | Freq: Two times a day (BID) | ORAL | Status: DC
  Administered 2015-06-22: 300 mL via RECTAL
  Filled 2015-06-22 (×7): qty 300

## 2015-06-22 MED ORDER — DEXTROSE 5 % IV SOLN
1.0000 g | INTRAVENOUS | Status: DC
  Administered 2015-06-22: 1 g via INTRAVENOUS
  Filled 2015-06-22: qty 10

## 2015-06-22 NOTE — Progress Notes (Signed)
[place] Progress Note Patient Name: Tamara Mcdaid DOB: 1961-02-11 MRN: [id number]   Date of Service  06/22/2015  HPI/Events of Note  High CBGs DM-1, hypoglycemic on admit  eICU Interventions  SSI -mod     Intervention Category Intermediate Interventions: Hyperglycemia - evaluation and treatment  Jaemarie Hochberg V. 06/22/2015, 5:20 PM

## 2015-06-22 NOTE — Progress Notes (Signed)
Tamara Silva DDU:[id number] DOB: 1960-10-05 DOA: 06/20/2015 PCP: Angelica Chessman, MD  Brief narrative: 55 y/o ?  IDDM Ssy/Diastolic HF NYHA class 2; EF 30-35% COPD on Home O2 CHr PE 10/21/12  on ASA and effient CAD with PCI x 2 2006 & 2009-STEMI 10/09/13 DM ty II Htn Gastroparesis s/p colonoscopy 08/04/2013  BIpolar                                                  Admitted with Iatrogenic low CBg's and SOb /AECHF   Past medical history-As per Problem list Chart reviewed as below-   Consultants:  none  Procedures:  non  Antibiotics:  rocephine started 1/22   Subjective   anxious Looks more confused than prior Not asnwering q's appropriately S[pat up on herslef earlier Cannot give clear ROS     Objective    Interim History:   Telemetry: Sinus/sinus tach   Objective: Filed Vitals:   06/21/15 2048 06/22/15 0249 06/22/15 0408 06/22/15 0828  BP: 114/40  121/67   Pulse: 82  82   Temp: 100.5 F (38.1 C)  98.6 F (37 C)   TempSrc: Oral  Axillary   Resp: 18  18   Height:      Weight:   135.2 kg (298 lb 1 oz)   SpO2: 94% 94% 96% 95%    Intake/Output Summary (Last 24 hours) at 06/22/15 0855 Last data filed at 06/21/15 2325  Gross per 24 hour  Intake    723 ml  Output   1550 ml  Net   -827 ml    Exam:  HEENT anxious ths am.  Felt SOB and wehezy and Resp called O2 sats 100 % CHEST wheezy posterioryl with ralesno CW tenderness, cannot assess JVD 2/2 to habitu ABDOMEN obese nt nd no rebound NEURO intact, moving all 4 limbs SKIN/MUSCULAR grade 2 dd   Data Reviewed: Basic Metabolic Panel:  Recent Labs Lab 06/20/15 2336 06/22/15 0512  NA 138 138  K 4.6 5.4*  CL 102 96*  CO2 29 36*  GLUCOSE 79 214*  BUN 10 16  CREATININE 1.02* 0.96  CALCIUM 8.1* 8.5*   Liver Function Tests:  Recent Labs Lab 06/20/15 2336  AST 18  ALT 17  ALKPHOS 65  BILITOT 0.3  PROT 6.8  ALBUMIN 2.6*   No results for input(s): LIPASE, AMYLASE in the last  168 hours. No results for input(s): AMMONIA in the last 168 hours. CBC:  Recent Labs Lab 06/20/15 2336  WBC 8.9  HGB 8.6*  HCT 29.9*  MCV 75.5*  PLT 173   Cardiac Enzymes:  Recent Labs Lab 06/20/15 2336  TROPONINI 0.05*   BNP: Invalid input(s): POCBNP CBG:  Recent Labs Lab 06/21/15 1638 06/21/15 2125 06/21/15 2340 06/22/15 0417 06/22/15 0850  GLUCAP 184* 181* 252* 218* 174*    No results found for this or any previous visit (from the past 240 hour(s)).   Studies:              All Imaging reviewed and is as per above notation   Scheduled Meds: . albuterol  2.5 mg Nebulization Q6H  . ALPRAZolam  0.5 mg Oral TID  . aspirin EC  81 mg Oral Daily  . carvedilol  12.5 mg Oral BID WC  . docusate sodium  100 mg Oral Daily  . enoxaparin (  LOVENOX) injection  40 mg Subcutaneous Daily  . ferrous sulfate  325 mg Oral BID WC  . furosemide  40 mg Intravenous Daily  . pantoprazole  80 mg Oral Daily  . prasugrel  10 mg Oral Daily  . pravastatin  80 mg Oral QPM  . prazosin  5 mg Oral QHS  . sodium chloride  3 mL Intravenous Q12H   Continuous Infusions:    Assessment/Plan:  1. Toxic met encephalopathy-? Hypercarbia pco294, pH 7.2 vs Hyperammonemia-Will need PPV.  Unclear if will cooperate with Bipap-Have asked critical care to weigh in.  Is a full ciode although patient too obtunded to tell me her exact wishes.  Rpt all labs stat, rpt CXR stat portable 2. AECHF-poor compliance on diet and Lasix. Continue Lasix 40 IV daily, already minus 2.8 L, weight is not reliable as her weight has actually gone up. Creatinine still okay so monitor on diuretics with labs . Hold oral meds for now 3. Hyperkalemia possibly secondary to use of Aldactone. Discontinued 1/22 4. COPD on home oxygen 2 L-continue oxygen. Continue 5. Chronic PE 10/17/12 on aspirin and Effient-stable continue duo neb and pronator as well as Flonase nasal spray 6. Hypertension -hold meds as npo right now 7. Diabetes  mellitus type 2-blood sugar ranging 174 218, continue to monitor. Place sliding-scale coverage on board. Home medications include Levemir 45 units and 3 times a day insulin 15-20 units 8. Hyperlipidemia continue Pravachol 80 daily 9. Reflux continue Prilosec 40 daily  Patient ill-appearing and not faring well discussed with bf at bedside Called daughter re: patient appearance. Tx to SDU/ICU Further dispo pending based on clinical condition  Verneita Griffes, MD  [facility] Pager [phone] 06/22/2015, 8:55 AM    LOS: 1 day

## 2015-06-22 NOTE — Significant Event (Addendum)
Rapid Response Event Note  Overview: Time Called: [id number] Arrival Time: [id number] Event Type: Respiratory  Initial Focused Assessment:  Called by RN for patient with respiratory distress.  Upon my arrival to patients room, RN and RT and family at bedside.  Patient sitting up in bed with nasal cannula on, mouth breathing with increased WOB and SOB.   VS 142/66, HR 82, RR 26, shallow resps, Spo2 100% on nasal cannula 3 lpm.  Breath Sounds crackles/wheezing bilaterally   Interventions:  ABG by RT, Dr Verlon Au at bedside. results Ph 7.24, Co2 92.8, O2 86.9, HCo3 38.6 results given to Dr. Verlon Au.  RT placing pt on BIPAP.  Patient tolerating BIPAP right now.  Pet, NP at bedside   Event Summary:  Patient to transfer to ICU   at      at          [facility], Harlin Rain

## 2015-06-22 NOTE — Progress Notes (Signed)
Patient transferred to 2H09 at 3:45 pm accompanied by two RN's  and transported via bed with Bipab.

## 2015-06-22 NOTE — Progress Notes (Signed)
Patient was restless and lethargic having respiratory distress.  MD and Respiratory therapy notified.O2 at 100% breathing treatment was given several times but patient remains in distress. Rapid response notified because patient continued to have severe respiratory distress .  [name] Kitchen Boyfriend at bedside. Daughter notified. ABG resutls PH 7.24, Co2 92.8, O2 86.9, Hco3 38.6 results was given to Dr. Raliegh Ip.

## 2015-06-22 NOTE — Consult Note (Signed)
PULMONARY / CRITICAL CARE MEDICINE   Name: Tamara Silva MRN: [id number] DOB: 1961-02-26    ADMISSION DATE:  06/20/2015 CONSULTATION DATE:  1/22  REFERRING MD:  Raliegh Ip   CHIEF COMPLAINT:  Acute encephalopathy and progressive hypercarbia   HISTORY OF PRESENT ILLNESS:   60 YOF w/ sig h/o IDDM, systolic and diastolic HF w/ EF [id number], chronic respiratory failure in the setting of  COPD, OSA and h/o PE. Presented to the ED on 1/20 initially w/ reported unintentional insulin overdose (initial glucose on EMS arrival was 16). In the ED, her blood sugar was 100, and electrolytes were normal. She was groggy and lethargic and complained of some dyspnea. During her workup, she was noted incidentally have elevated BNP, minimally elevated TNI, and pulmonary edema appearing on chest x-ray. Shortly after admit did c/o increasing anxiety, this progressed to worsening confusion going into 1/22. It was initially felt to be due to scheduled xanax so this was held. Over the course of the day her work of breathing seemed to worsen, an ammonia was obtained and noted to be high so this was treated w/ lactulose. Her work of breathing continued to worsen so an ABG was obtained showing severe respiratory acidosis. She was placed on NIPPV and PCCM was asked to assess and assist w/ care.  PAST MEDICAL HISTORY :  She  has a past medical history of Diabetes mellitus without complication ([place]) and Asthma.  PAST SURGICAL HISTORY: She  has past surgical history that includes Coronary angioplasty with stent; Esophagogastroduodenoscopy (egd) with propofol (N/A, 08/03/2013); Colonoscopy with propofol (N/A, 08/03/2013); Coronary angioplasty with stent (10/07/2013); and left heart catheterization with coronary angiogram (N/A, 10/07/2013).  Allergies  Allergen Reactions  . Metolazone Other (See Comments)    Dizziness and falling  . Plavix [Clopidogrel Bisulfate] Other (See Comments)    High PRU's - non-responder  . Nsaids Other (See  Comments)    "I do not take NSAIDs d/t interfering with other meds"    No current facility-administered medications on file prior to encounter.   Current Outpatient Prescriptions on File Prior to Encounter  Medication Sig  . ACCU-CHEK AVIVA PLUS test strip USE 1 STRIP TO CHECK GLUCOSE 3 TIMES DAILY  . ACCU-CHEK SOFTCLIX LANCETS lancets USE 1 LANCET TO CHECK GLUCOSE 3 TIMES DAILY  . aspirin 81 MG tablet Take 1 tablet (81 mg total) by mouth daily.  . carvedilol (COREG) 12.5 MG tablet Take 1 tablet (12.5 mg total) by mouth 2 (two) times daily with a meal.  . cycloSPORINE (RESTASIS) 0.05 % ophthalmic emulsion Place 2 drops into both eyes 2 (two) times daily as needed (for dry eyes).   [name] Kitchen EASY TOUCH PEN NEEDLES 31G X 8 MM MISC USE AS DIRECTED TO INJECT INSULIN  . EFFIENT 10 MG TABS tablet TAKE ONE TABLET BY MOUTH DAILY.  . fluticasone (FLONASE) 50 MCG/ACT nasal spray USE 2 SPRAYS IN EACH NOSTRIL TWICE DAILY  . furosemide (LASIX) 40 MG tablet TAKE 2 TABLETS BY MOUTH IN THE MORNING AND THEN TAKE 1 TABLET IN THE EVENING  . insulin aspart (NOVOLOG) 100 UNIT/ML injection Inject 15-20 Units into the skin 3 (three) times daily before meals. Sliding scale  . insulin detemir (LEVEMIR) 100 UNIT/ML injection Inject 0.45 mLs (45 Units total) into the skin at bedtime.  . INSULIN SYRINGE .5CC/29G 29G X 1/2" 0.5 ML MISC Use as instructed by physician  . ipratropium-albuterol (DUONEB) 0.5-2.5 (3) MG/3ML SOLN Take 3 mLs by nebulization every 6 (six) hours as needed. (Patient  taking differently: Take 3 mLs by nebulization every 6 (six) hours as needed (wheezing and shortness of breath). )  . nitroGLYCERIN (NITROSTAT) 0.4 MG SL tablet Place 1 tablet (0.4 mg total) under the tongue every 5 (five) minutes as needed for chest pain. MUST MAKE APPOINTMENT FOR FUTURE REFILLS  . omeprazole (PRILOSEC) 40 MG capsule TAKE 1 CAPSULE BY MOUTH TWICE DAILY  . ondansetron (ZOFRAN ODT) 8 MG disintegrating tablet Take 1 tablet (8 mg  total) by mouth every 8 (eight) hours as needed for nausea or vomiting.  . Paliperidone Palmitate (INVEGA SUSTENNA) 117 MG/0.75ML SUSP Inject 156 mg into the muscle every 3 (three) months.   . pravastatin (PRAVACHOL) 80 MG tablet Take 1 tablet (80 mg total) by mouth every evening.  . prazosin (MINIPRESS) 5 MG capsule TAKE 1 CAPSULE BY MOUTH EVERY NIGHT AT BEDTIME FOR NIGHTMARES (Patient taking differently: TAKE 1 CAPSULE BY MOUTH EVERY NIGHT AT BEDTIME AS NEEDED FOR NIGHTMARES)  . PROAIR HFA 108 (90 BASE) MCG/ACT inhaler INHALE 2 PUFFS BY MOUTH 4 TIMES DAILY AS NEEDED (Patient taking differently: INHALE 2 PUFFS BY MOUTH 4 TIMES DAILY AS NEEDED FOR SHORTNES OF BREATH)  . promethazine (PHENERGAN) 25 MG tablet Take 1 tablet (25 mg total) by mouth every 6 (six) hours as needed for nausea or vomiting.  [name] Kitchen spironolactone (ALDACTONE) 25 MG tablet Take 1 tablet (25 mg total) by mouth daily.    FAMILY HISTORY:  Her indicated that her mother is deceased. She indicated that her father is deceased. She indicated that her other is alive.   SOCIAL HISTORY: She  reports that she quit smoking about 13 months ago. Her smoking use included Cigarettes. She has a 10 pack-year smoking history. She has never used smokeless tobacco. She reports that she does not drink alcohol or use illicit drugs.  REVIEW OF SYSTEMS:   Unable to obtain.   SUBJECTIVE:  Lethargic, RN states following some commands.   VITAL SIGNS: BP 142/66 mmHg  Pulse 82  Temp(Src) 99.9 F (37.7 C) (Oral)  Resp 26  Ht 5' 7.5" (1.715 m)  Wt 135.2 kg (298 lb 1 oz)  BMI 45.97 kg/m2  SpO2 96%  LMP 05/30/2013  HEMODYNAMICS:    VENTILATOR SETTINGS: Vent Mode:  [-] BIPAP;PCV FiO2 (%):  [40 %] 40 % Set Rate:  [20 bmp-26 bmp] 26 bmp PEEP:  [6 cmH20-8 cmH20] 8 cmH20  INTAKE / OUTPUT: I/O last 3 completed shifts: In: 963 [P.O.:960; I.V.:3] Out: 2850 [Urine:2850]  PHYSICAL EXAMINATION: General:  NAD, on bipap.  Neuro:  Not following  commands HEENT:  [place]/AT Cardiovascular:  RRR Lungs:  Diffuse wheezing Abdomen:  Obese, NT/ND, +BS.  Ext: Warm, no LE edema   LABS:  BMET  Recent Labs Lab 06/20/15 2336 06/22/15 0512  NA 138 138  K 4.6 5.4*  CL 102 96*  CO2 29 36*  BUN 10 16  CREATININE 1.02* 0.96  GLUCOSE 79 214*    Electrolytes  Recent Labs Lab 06/20/15 2336 06/22/15 0512  CALCIUM 8.1* 8.5*    CBC  Recent Labs Lab 06/20/15 2336  WBC 8.9  HGB 8.6*  HCT 29.9*  PLT 173    Coag's No results for input(s): APTT, INR in the last 168 hours.  Sepsis Markers No results for input(s): LATICACIDVEN, PROCALCITON, O2SATVEN in the last 168 hours.  ABG  Recent Labs Lab 06/22/15 1500  PHART 7.243*  PCO2ART 92.8*  PO2ART 86.9    Liver Enzymes  Recent Labs Lab 06/20/15 2336 06/22/15  1140  AST 18 14*  ALT 17 15  ALKPHOS 65 58  BILITOT 0.3 0.3  ALBUMIN 2.6* 2.6*    Cardiac Enzymes  Recent Labs Lab 06/20/15 2336  TROPONINI 0.05*    Glucose  Recent Labs Lab 06/21/15 1638 06/21/15 2125 06/21/15 2340 06/22/15 0417 06/22/15 0850 06/22/15 1149  GLUCAP 184* 181* 252* 218* 174* 219*    Imaging Dg Chest Port 1 View  06/22/2015  CLINICAL DATA:  Altered mental status. Acute encephalopathy. COPD, hypertension, coronary artery disease and diabetes. EXAM: PORTABLE CHEST 1 VIEW COMPARISON:  06/21/2015 and chest CT on 10 515 FINDINGS: Cardiomegaly remains stable. No evidence congestive heart failure. Bilateral pleural-parenchymal scarring appears stable. No evidence of pulmonary consolidation or pleural effusion. No pneumothorax visualized. IMPRESSION: Stable cardiomegaly and bilateral pleural- parenchymal scarring. No acute findings. Electronically Signed   By: Earle Gell M.D.   On: 06/22/2015 16:22    LINES / TUBES: 1/20 PIV >> 1/22 Foley >>  CULTURES: MRSA PCR 1/22 >>  UCx 1/21>> BCx 1/21>> RVP 1/22>>  ANTIBIOTICS: Ceftriaxone 1/22>>  SIGNIFICANT EVENTS / STUDIES:  1/21  Admit with hypoglycemia, with subsequent pulmonary edema on CXR.    VITAL SIGNS: Blood pressure 142/66, pulse 82, temperature 99.9 F (37.7 C), temperature source Oral, resp. rate 26, height 5' 7.5" (1.715 m), weight 135.2 kg (298 lb 1 oz), last menstrual period 05/30/2013, SpO2 96 %.  PHYSICAL EXAMINATION: General:  NAD, on vent  Neuro: Follows commands, arousable,  HEENT:  [place]/AT, PERRL, ETT Cardiovascular:  RRR, no M/R/G heard Lungs:  CTA anteriorly  Abdomen:  +BS, soft, NT/ND Ext:  warm, dry; no edema   ASSESSMENT / PLAN:  PULMONARY A: Acute on chronic hypercarbic respiratory failure  Acute pulmonary edema COPD  ?CAP  ?Chronic pulmonary embolism P:   Cont Bipap  Cont nebulized BDs Solumedrol 60mg  q8h Continuous pulse oximetry PCT  Follow cultures Check urine strept/legionella Cont ceftriaxone, add azithromycin   CARDIOVASCULAR A:  HTN CHF, echo 07/2014 LVEF: 40-45% CAD P:  Cont ASA, effient   RENAL A:   Hyperkalemia, 5.4 Metabolic alkalosis P:   Monitor BMET Strict I/Os  GASTROINTESTINAL A:   GERD, on chronic PPI GI ppx P:   Protonix   HEMATOLOGIC A:   Chronic microcytic anemia, hgb 8.6 today  P:  VTE ppx: lovenox and SCDs Monitor cbc  INFECTIOUS A:   ?UTI, on ceftriaxone P:   CXR in AM Pan cultures Micro and abx as above Check PCT  ENDOCRINE A:   DM II Hypoglycemia, resolved P:   Cont CBGs/SSI  NEUROLOGIC A:  Hyperammonemia Acute encephalopathy - likely d/t hypercarbia/hyperammonemia  P:   Limit sedating medications  Cont lactulose   Michail Jewels,  Internal Medicine, PGY-3 06/22/2015, 5:03 PM

## 2015-06-22 NOTE — Progress Notes (Signed)
Personally reviewed repeat ABGs. PCO2 90 not significantly changed from earlier, oxygenation improved now pO2 > 100 on 40% FiO2. Continues to progress with neg fluid balance. Mental status A&Ox3 unchanged from earlier. She is tachypneic but not significantly high WOB/bellybreathing/accesory muscles. No tachycardia, no acute distress. Continuing NIPPV at this time.   Collier Salina, MD PGY-I Internal Medicine Resident Pager# [phone] 06/22/2015, 11:28 PM

## 2015-06-23 ENCOUNTER — Inpatient Hospital Stay (HOSPITAL_COMMUNITY): Payer: Medicaid Other

## 2015-06-23 DIAGNOSIS — Z823 Family history of stroke: Secondary | ICD-10-CM | POA: Diagnosis not present

## 2015-06-23 DIAGNOSIS — Z7982 Long term (current) use of aspirin: Secondary | ICD-10-CM | POA: Diagnosis not present

## 2015-06-23 DIAGNOSIS — E1143 Type 2 diabetes mellitus with diabetic autonomic (poly)neuropathy: Secondary | ICD-10-CM | POA: Diagnosis present

## 2015-06-23 DIAGNOSIS — Z794 Long term (current) use of insulin: Secondary | ICD-10-CM | POA: Diagnosis not present

## 2015-06-23 DIAGNOSIS — E11649 Type 2 diabetes mellitus with hypoglycemia without coma: Secondary | ICD-10-CM | POA: Diagnosis present

## 2015-06-23 DIAGNOSIS — I509 Heart failure, unspecified: Secondary | ICD-10-CM | POA: Diagnosis present

## 2015-06-23 DIAGNOSIS — I10 Essential (primary) hypertension: Secondary | ICD-10-CM | POA: Diagnosis present

## 2015-06-23 DIAGNOSIS — I2782 Chronic pulmonary embolism: Secondary | ICD-10-CM | POA: Diagnosis present

## 2015-06-23 DIAGNOSIS — E875 Hyperkalemia: Secondary | ICD-10-CM | POA: Diagnosis not present

## 2015-06-23 DIAGNOSIS — J9622 Acute and chronic respiratory failure with hypercapnia: Secondary | ICD-10-CM | POA: Diagnosis not present

## 2015-06-23 DIAGNOSIS — E1100 Type 2 diabetes mellitus with hyperosmolarity without nonketotic hyperglycemic-hyperosmolar coma (NKHHC): Secondary | ICD-10-CM | POA: Diagnosis present

## 2015-06-23 DIAGNOSIS — J9621 Acute and chronic respiratory failure with hypoxia: Secondary | ICD-10-CM | POA: Diagnosis not present

## 2015-06-23 DIAGNOSIS — J441 Chronic obstructive pulmonary disease with (acute) exacerbation: Secondary | ICD-10-CM | POA: Diagnosis present

## 2015-06-23 DIAGNOSIS — E119 Type 2 diabetes mellitus without complications: Secondary | ICD-10-CM | POA: Diagnosis not present

## 2015-06-23 DIAGNOSIS — Z9981 Dependence on supplemental oxygen: Secondary | ICD-10-CM | POA: Diagnosis not present

## 2015-06-23 DIAGNOSIS — D509 Iron deficiency anemia, unspecified: Secondary | ICD-10-CM | POA: Diagnosis present

## 2015-06-23 DIAGNOSIS — K3184 Gastroparesis: Secondary | ICD-10-CM | POA: Diagnosis present

## 2015-06-23 DIAGNOSIS — Z825 Family history of asthma and other chronic lower respiratory diseases: Secondary | ICD-10-CM | POA: Diagnosis not present

## 2015-06-23 DIAGNOSIS — I5023 Acute on chronic systolic (congestive) heart failure: Secondary | ICD-10-CM | POA: Diagnosis not present

## 2015-06-23 DIAGNOSIS — Z8701 Personal history of pneumonia (recurrent): Secondary | ICD-10-CM | POA: Diagnosis not present

## 2015-06-23 DIAGNOSIS — T383X1A Poisoning by insulin and oral hypoglycemic [antidiabetic] drugs, accidental (unintentional), initial encounter: Secondary | ICD-10-CM | POA: Diagnosis present

## 2015-06-23 DIAGNOSIS — Z833 Family history of diabetes mellitus: Secondary | ICD-10-CM | POA: Diagnosis not present

## 2015-06-23 DIAGNOSIS — I2510 Atherosclerotic heart disease of native coronary artery without angina pectoris: Secondary | ICD-10-CM | POA: Diagnosis present

## 2015-06-23 DIAGNOSIS — Z6841 Body Mass Index (BMI) 40.0 and over, adult: Secondary | ICD-10-CM | POA: Diagnosis not present

## 2015-06-23 DIAGNOSIS — G9341 Metabolic encephalopathy: Secondary | ICD-10-CM | POA: Diagnosis not present

## 2015-06-23 DIAGNOSIS — E662 Morbid (severe) obesity with alveolar hypoventilation: Secondary | ICD-10-CM | POA: Diagnosis present

## 2015-06-23 DIAGNOSIS — Z955 Presence of coronary angioplasty implant and graft: Secondary | ICD-10-CM | POA: Diagnosis not present

## 2015-06-23 DIAGNOSIS — E1165 Type 2 diabetes mellitus with hyperglycemia: Secondary | ICD-10-CM | POA: Diagnosis not present

## 2015-06-23 DIAGNOSIS — Z803 Family history of malignant neoplasm of breast: Secondary | ICD-10-CM | POA: Diagnosis not present

## 2015-06-23 DIAGNOSIS — F319 Bipolar disorder, unspecified: Secondary | ICD-10-CM | POA: Diagnosis present

## 2015-06-23 DIAGNOSIS — N182 Chronic kidney disease, stage 2 (mild): Secondary | ICD-10-CM | POA: Diagnosis not present

## 2015-06-23 DIAGNOSIS — E162 Hypoglycemia, unspecified: Secondary | ICD-10-CM | POA: Diagnosis not present

## 2015-06-23 DIAGNOSIS — Z8249 Family history of ischemic heart disease and other diseases of the circulatory system: Secondary | ICD-10-CM | POA: Diagnosis not present

## 2015-06-23 DIAGNOSIS — I255 Ischemic cardiomyopathy: Secondary | ICD-10-CM | POA: Diagnosis present

## 2015-06-23 DIAGNOSIS — Z87891 Personal history of nicotine dependence: Secondary | ICD-10-CM | POA: Diagnosis not present

## 2015-06-23 DIAGNOSIS — G4733 Obstructive sleep apnea (adult) (pediatric): Secondary | ICD-10-CM | POA: Diagnosis not present

## 2015-06-23 DIAGNOSIS — F419 Anxiety disorder, unspecified: Secondary | ICD-10-CM | POA: Diagnosis not present

## 2015-06-23 DIAGNOSIS — Z800 Family history of malignant neoplasm of digestive organs: Secondary | ICD-10-CM | POA: Diagnosis not present

## 2015-06-23 DIAGNOSIS — G9340 Encephalopathy, unspecified: Secondary | ICD-10-CM | POA: Diagnosis not present

## 2015-06-23 DIAGNOSIS — B373 Candidiasis of vulva and vagina: Secondary | ICD-10-CM | POA: Diagnosis present

## 2015-06-23 DIAGNOSIS — E785 Hyperlipidemia, unspecified: Secondary | ICD-10-CM | POA: Diagnosis present

## 2015-06-23 DIAGNOSIS — K219 Gastro-esophageal reflux disease without esophagitis: Secondary | ICD-10-CM | POA: Diagnosis present

## 2015-06-23 DIAGNOSIS — I5043 Acute on chronic combined systolic (congestive) and diastolic (congestive) heart failure: Secondary | ICD-10-CM | POA: Diagnosis not present

## 2015-06-23 DIAGNOSIS — E874 Mixed disorder of acid-base balance: Secondary | ICD-10-CM | POA: Diagnosis not present

## 2015-06-23 DIAGNOSIS — J45909 Unspecified asthma, uncomplicated: Secondary | ICD-10-CM | POA: Diagnosis present

## 2015-06-23 DIAGNOSIS — I252 Old myocardial infarction: Secondary | ICD-10-CM | POA: Diagnosis not present

## 2015-06-23 DIAGNOSIS — Z888 Allergy status to other drugs, medicaments and biological substances status: Secondary | ICD-10-CM | POA: Diagnosis not present

## 2015-06-23 DIAGNOSIS — K761 Chronic passive congestion of liver: Secondary | ICD-10-CM | POA: Diagnosis present

## 2015-06-23 LAB — GLUCOSE, CAPILLARY
GLUCOSE-CAPILLARY: 194 mg/dL — AB (ref 65–99)
GLUCOSE-CAPILLARY: 195 mg/dL — AB (ref 65–99)
GLUCOSE-CAPILLARY: 202 mg/dL — AB (ref 65–99)
Glucose-Capillary: 212 mg/dL — ABNORMAL HIGH (ref 65–99)
Glucose-Capillary: 191 mg/dL — ABNORMAL HIGH (ref 65–99)
Glucose-Capillary: 333 mg/dL — ABNORMAL HIGH (ref 65–99)

## 2015-06-23 LAB — COMPREHENSIVE METABOLIC PANEL
ALT: 16 U/L (ref 14–54)
AST: 15 U/L (ref 15–41)
Albumin: 2.5 g/dL — ABNORMAL LOW (ref 3.5–5.0)
Alkaline Phosphatase: 49 U/L (ref 38–126)
Anion gap: 7 (ref 5–15)
BILIRUBIN TOTAL: 0.5 mg/dL (ref 0.3–1.2)
BUN: 19 mg/dL (ref 6–20)
CALCIUM: 8.5 mg/dL — AB (ref 8.9–10.3)
CO2: 37 mmol/L — ABNORMAL HIGH (ref 22–32)
CREATININE: 0.97 mg/dL (ref 0.44–1.00)
Chloride: 94 mmol/L — ABNORMAL LOW (ref 101–111)
GFR calc Af Amer: >60 mL/min (ref >60)
GFR calc non Af Amer: >60 mL/min (ref >60)
GLUCOSE: 216 mg/dL — AB (ref 65–99)
Potassium: 5.5 mmol/L — ABNORMAL HIGH (ref 3.5–5.1)
Sodium: 138 mmol/L (ref 135–145)
TOTAL PROTEIN: 7 g/dL (ref 6.5–8.1)

## 2015-06-23 LAB — BLOOD GAS, ARTERIAL
ACID-BASE EXCESS: 13.1 mmol/L — AB (ref 0.0–2.0)
Bicarbonate: 39.4 mEq/L — ABNORMAL HIGH (ref 20.0–24.0)
Drawn by: 313941
O2 Content: 4 L/min
O2 Saturation: 98 %
PATIENT TEMPERATURE: 98.5
PO2 ART: 110 mmHg — AB (ref 80.0–100.0)
TCO2: 41.7 mmol/L (ref 0–100)
pCO2 arterial: 75.7 mmHg (ref 35.0–45.0)
pH, Arterial: 7.336 — ABNORMAL LOW (ref 7.350–7.450)

## 2015-06-23 LAB — CBC
HCT: 29.4 % — ABNORMAL LOW (ref 36.0–46.0)
Hemoglobin: 8.1 g/dL — ABNORMAL LOW (ref 12.0–15.0)
MCH: 21.6 pg — AB (ref 26.0–34.0)
MCHC: 27.6 g/dL — ABNORMAL LOW (ref 30.0–36.0)
MCV: 78.4 fL (ref 78.0–100.0)
Platelets: 197 10*3/uL (ref 150–400)
RBC: 3.75 MIL/uL — ABNORMAL LOW (ref 3.87–5.11)
RDW: 19.2 % — AB (ref 11.5–15.5)
WBC: 5.1 10*3/uL (ref 4.0–10.5)

## 2015-06-23 LAB — RESPIRATORY VIRUS PANEL
Adenovirus: NEGATIVE
INFLUENZA A: NEGATIVE
Influenza B: NEGATIVE
Metapneumovirus: NEGATIVE
PARAINFLUENZA 1 A: NEGATIVE
PARAINFLUENZA 3 A: NEGATIVE
Parainfluenza 2: NEGATIVE
RESPIRATORY SYNCYTIAL VIRUS B: NEGATIVE
Respiratory Syncytial Virus A: NEGATIVE
Rhinovirus: NEGATIVE

## 2015-06-23 LAB — URINE CULTURE: Special Requests: NORMAL

## 2015-06-23 LAB — MAGNESIUM: MAGNESIUM: 1.9 mg/dL (ref 1.7–2.4)

## 2015-06-23 MED ORDER — CHLORHEXIDINE GLUCONATE 0.12 % MT SOLN
15.0000 mL | Freq: Two times a day (BID) | OROMUCOSAL | Status: DC
  Administered 2015-06-23: 15 mL via OROMUCOSAL
  Filled 2015-06-23: qty 15

## 2015-06-23 MED ORDER — CETYLPYRIDINIUM CHLORIDE 0.05 % MT LIQD
7.0000 mL | Freq: Two times a day (BID) | OROMUCOSAL | Status: DC
  Administered 2015-06-23: 7 mL via OROMUCOSAL

## 2015-06-23 MED ORDER — SODIUM CHLORIDE 0.9 % IV SOLN: INTRAVENOUS | Status: DC

## 2015-06-23 MED ORDER — SODIUM CHLORIDE 0.9 % IJ SOLN
10.0000 mL | INTRAMUSCULAR | Status: DC | PRN
  Administered 2015-06-25 – 2015-06-28 (×5): 10 mL
  Filled 2015-06-23 (×5): qty 40

## 2015-06-23 MED ORDER — FLUCONAZOLE IN SODIUM CHLORIDE 100-0.9 MG/50ML-% IV SOLN
100.0000 mg | INTRAVENOUS | Status: DC
Start: 2015-06-23 — End: 2015-06-24
  Administered 2015-06-23 – 2015-06-24 (×2): 100 mg via INTRAVENOUS
  Filled 2015-06-23 (×6): qty 50

## 2015-06-23 MED ORDER — CETYLPYRIDINIUM CHLORIDE 0.05 % MT LIQD
7.0000 mL | Freq: Two times a day (BID) | OROMUCOSAL | Status: DC
  Administered 2015-06-24 – 2015-06-29 (×11): 7 mL via OROMUCOSAL

## 2015-06-23 MED ORDER — SODIUM CHLORIDE 0.9 % IJ SOLN
10.0000 mL | Freq: Two times a day (BID) | INTRAMUSCULAR | Status: DC
  Administered 2015-06-23 – 2015-06-24 (×3): 10 mL

## 2015-06-23 NOTE — Progress Notes (Signed)
PULMONARY / CRITICAL CARE MEDICINE   Name: Tamara Silva MRN: [id number] DOB: 12-Dec-1960    ADMISSION DATE:  06/20/2015 CONSULTATION DATE:  1/22  REFERRING MD:  Verlon Au   CHIEF COMPLAINT:  Acute encephalopathy and progressive hypercarbia   HISTORY OF PRESENT ILLNESS:   53 YOF w/ sig h/o IDDM, systolic and diastolic HF w/ EF [id number], chronic respiratory failure in the setting of  COPD, OSA and h/o PE (? Treated). Presented to the ED on 1/20 initially w/ reported unintentional insulin overdose (initial glucose on EMS arrival was 16). In the ED, her blood sugar was 100, and electrolytes were normal. She was groggy and lethargic and complained of some dyspnea. During her workup, she was noted incidentally have elevated BNP, minimally elevated TNI, and pulmonary edema appearing on chest x-ray. Shortly after admit did c/o increasing anxiety, this progressed to worsening confusion going into 1/22. It was initially felt to be due to scheduled xanax so this was held. Over the course of the day her work of breathing seemed to worsen, an ammonia was obtained and noted to be high so this was treated w/ lactulose. Her work of breathing continued to worsen so an ABG was obtained showing severe respiratory acidosis. She was placed on NIPPV and PCCM was asked to assess and assist w/ care.  SUBJECTIVE:  Has tolerated BiPAP.  Interacts and following commands, more awake  VITAL SIGNS: BP 109/65 mmHg  Pulse 60  Temp(Src) 97.7 F (36.5 C) (Axillary)  Resp 25  Ht 5' 7.5" (1.715 m)  Wt 125 kg (275 lb 9.2 oz)  BMI 42.50 kg/m2  SpO2 99%  LMP 05/30/2013  HEMODYNAMICS:    VENTILATOR SETTINGS: Vent Mode:  [-] BIPAP;PCV FiO2 (%):  [30 %-40 %] 30 % Set Rate:  [20 bmp-26 bmp] 26 bmp PEEP:  [6 cmH20-8 cmH20] 8 cmH20  INTAKE / OUTPUT: I/O last 3 completed shifts: In: [id number] [P.O.:460; I.V.:233; IV Piggyback:300] Out: 2535 [Urine:2535]  PHYSICAL EXAMINATION: General:  NAD, on bipap.  Neuro:  Awake and  interacting, follows commands HEENT:  [place]/AT Cardiovascular:  RRR Lungs:  Small volumes, wheeze has resolved Abdomen:  Obese, NT/ND, +BS.  Ext: Warm, no LE edema   LABS:  BMET  Recent Labs Lab 06/22/15 0512 06/22/15 1738 06/23/15 0245  NA 138 138  139 138  K 5.4* 5.0  5.0 5.5*  CL 96* 94*  94* 94*  CO2 36* 38*  39* 37*  BUN 16 14  15 19   CREATININE 0.96 1.00  0.95 0.97  GLUCOSE 214* 233*  229* 216*    Electrolytes  Recent Labs Lab 06/22/15 0512 06/22/15 1738 06/23/15 0245 06/23/15 0453  CALCIUM 8.5* 8.5*  8.5* 8.5*  --   MG  --  1.9  --  1.9  PHOS  --  3.4  --   --     CBC  Recent Labs Lab 06/20/15 2336 06/22/15 1738 06/23/15 0245  WBC 8.9 5.7 5.1  HGB 8.6* 8.3* 8.1*  HCT 29.9* 31.0* 29.4*  PLT 173 200 197    Coag's  Recent Labs Lab 06/22/15 1738  INR 1.24    Sepsis Markers  Recent Labs Lab 06/22/15 1738  LATICACIDVEN 0.7  PROCALCITON 0.40    ABG  Recent Labs Lab 06/22/15 1500 06/22/15 1826 06/22/15 2251  PHART 7.243* 7.289* 7.282*  PCO2ART 92.8* 86.7* 90.4*  PO2ART 86.9 63.8* 104.0*    Liver Enzymes  Recent Labs Lab 06/22/15 1140 06/22/15 1738 06/23/15 0245  AST 14* 12* 15  ALT 15 16 16   ALKPHOS 58 57 49  BILITOT 0.3 0.5 0.5  ALBUMIN 2.6* 2.7*  2.7* 2.5*    Cardiac Enzymes  Recent Labs Lab 06/20/15 2336  TROPONINI 0.05*    Glucose  Recent Labs Lab 06/22/15 1149 06/22/15 1629 06/22/15 2013 06/23/15 0026 06/23/15 0416 06/23/15 0817  GLUCAP 219* 200* 190* 195* 202* 212*    Imaging Dg Chest Port 1 View  06/22/2015  CLINICAL DATA:  Altered mental status. Acute encephalopathy. COPD, hypertension, coronary artery disease and diabetes. EXAM: PORTABLE CHEST 1 VIEW COMPARISON:  06/21/2015 and chest CT on 10 515 FINDINGS: Cardiomegaly remains stable. No evidence congestive heart failure. Bilateral pleural-parenchymal scarring appears stable. No evidence of pulmonary consolidation or pleural effusion. No  pneumothorax visualized. IMPRESSION: Stable cardiomegaly and bilateral pleural- parenchymal scarring. No acute findings. Electronically Signed   By: Earle Gell M.D.   On: 06/22/2015 16:22    LINES / TUBES: 1/20 PIV >> 1/22 Foley >>  CULTURES: MRSA PCR 1/22 >>  negative UCx 1/21>> BCx 1/21>> RVP 1/22>>  ANTIBIOTICS: Ceftriaxone 1/22>> azithro 1/22 >>   SIGNIFICANT EVENTS / STUDIES:  1/21 Admit with hypoglycemia, with subsequent pulmonary edema on CXR.  Hypercap resp failure  ASSESSMENT / PLAN:  PULMONARY A: Acute on chronic hypercarbic respiratory failure  Acute pulmonary edema COPD w acute exacerbation ?CAP  ?Chronic pulmonary embolism P:   Cont Bipap continuous for now w breaks as needed.  Cont nebulized BDs Solumedrol 60mg  q8h Continuous pulse oximetry Follow cultures Follow urine strept/legionella Abx as below Empiric diuresis as she can tolerate  CARDIOVASCULAR A:  HTN CHF, echo 07/2014 LVEF: 40-45% CAD P:  Cont ASA, effient  Consider repeat TTE Follow BNP am 1/24   RENAL A:   Hyperkalemia, 5.4 Metabolic alkalosis P:   Monitor BMET Strict I/Os  GASTROINTESTINAL A:   GERD, on chronic PPI GI ppx Hyperammoniemia, etiology   P:   Protonix  Lactulose ordered Check ammonia trend 1/24 Check hep panel Check RUQ [place]  HEMATOLOGIC A:   Chronic microcytic anemia P:  VTE ppx: lovenox and SCDs Monitor cbc  INFECTIOUS A:   ?UTI Suspected CAP vs viral pneumonitis P:   Follow CXR Follow cx data abx as above  ENDOCRINE A:   DM II Hypoglycemia, resolved P:   Cont CBGs/SSI  NEUROLOGIC A:  Hyperammonemia Acute encephalopathy - multifactorial  P:   Limit sedating medications  Cont lactulose   Discussed status with family at bedside 1/23  Independent CC time 35 minutes.     Baltazar Apo, MD, PhD 06/23/2015, 10:45 AM [facility] [phone] or if no answer [phone]

## 2015-06-23 NOTE — Progress Notes (Signed)
Peripherally Inserted Central Catheter/Midline Placement  The IV Nurse has discussed with the patient and/or persons authorized to consent for the patient, the purpose of this procedure and the potential benefits and risks involved with this procedure.  The benefits include less needle sticks, lab draws from the catheter and patient may be discharged home with the catheter.  Risks include, but not limited to, infection, bleeding, blood clot (thrombus formation), and puncture of an artery; nerve damage and irregular heat beat.  Alternatives to this procedure were also discussed.  PICC/Midline Placement Documentation        Tamara Silva 06/23/2015, 9:38 AM

## 2015-06-23 NOTE — Plan of Care (Signed)
Problem: Cardiac: Goal: Ability to achieve and maintain adequate cardiopulmonary perfusion will improve Outcome: Progressing Patient is on bipap 30% and is tolerating it well with no issues. Oxygen saturation is at 98-100 %, sinus rhthym with frequent PVC and sinus brady when sleeping. Called the black box about SB and K+ 5.5, IV metoprolol held.

## 2015-06-24 ENCOUNTER — Inpatient Hospital Stay (HOSPITAL_COMMUNITY): Payer: Medicaid Other

## 2015-06-24 ENCOUNTER — Encounter (HOSPITAL_COMMUNITY): Payer: Self-pay | Admitting: *Deleted

## 2015-06-24 DIAGNOSIS — E119 Type 2 diabetes mellitus without complications: Secondary | ICD-10-CM

## 2015-06-24 DIAGNOSIS — B373 Candidiasis of vulva and vagina: Secondary | ICD-10-CM

## 2015-06-24 LAB — BASIC METABOLIC PANEL
Anion gap: 4 — ABNORMAL LOW (ref 5–15)
BUN: 32 mg/dL — AB (ref 6–20)
CO2: 39 mmol/L — ABNORMAL HIGH (ref 22–32)
Calcium: 8.3 mg/dL — ABNORMAL LOW (ref 8.9–10.3)
Chloride: 92 mmol/L — ABNORMAL LOW (ref 101–111)
Creatinine, Ser: 1.07 mg/dL — ABNORMAL HIGH (ref 0.44–1.00)
GFR calc Af Amer: >60 mL/min (ref >60)
GFR calc non Af Amer: 57 mL/min — ABNORMAL LOW (ref >60)
Glucose, Bld: 378 mg/dL — ABNORMAL HIGH (ref 65–99)
Potassium: 5.2 mmol/L — ABNORMAL HIGH (ref 3.5–5.1)
Sodium: 135 mmol/L (ref 135–145)

## 2015-06-24 LAB — BLOOD GAS, ARTERIAL
ACID-BASE EXCESS: 12.6 mmol/L — AB (ref 0.0–2.0)
BICARBONATE: 38.5 meq/L — AB (ref 20.0–24.0)
DELIVERY SYSTEMS: POSITIVE
Drawn by: 290171
EXPIRATORY PAP: 8
FIO2: 0.3
INSPIRATORY PAP: 20
MODE: POSITIVE
O2 Saturation: 96.6 %
Patient temperature: 98.6
TCO2: 40.7 mmol/L (ref 0–100)
pCO2 arterial: 70.1 mmHg (ref 35.0–45.0)
pH, Arterial: 7.359 (ref 7.350–7.450)
pO2, Arterial: 91.8 mmHg (ref 80.0–100.0)

## 2015-06-24 LAB — GLUCOSE, CAPILLARY
GLUCOSE-CAPILLARY: 135 mg/dL — AB (ref 65–99)
GLUCOSE-CAPILLARY: 183 mg/dL — AB (ref 65–99)
GLUCOSE-CAPILLARY: 271 mg/dL — AB (ref 65–99)
GLUCOSE-CAPILLARY: 299 mg/dL — AB (ref 65–99)
GLUCOSE-CAPILLARY: 340 mg/dL — AB (ref 65–99)
GLUCOSE-CAPILLARY: 345 mg/dL — AB (ref 65–99)
GLUCOSE-CAPILLARY: 393 mg/dL — AB (ref 65–99)
Glucose-Capillary: 336 mg/dL — ABNORMAL HIGH (ref 65–99)
Glucose-Capillary: 318 mg/dL — ABNORMAL HIGH (ref 65–99)
Glucose-Capillary: 239 mg/dL — ABNORMAL HIGH (ref 65–99)
Glucose-Capillary: 224 mg/dL — ABNORMAL HIGH (ref 65–99)
Glucose-Capillary: 117 mg/dL — ABNORMAL HIGH (ref 65–99)
Glucose-Capillary: 257 mg/dL — ABNORMAL HIGH (ref 65–99)

## 2015-06-24 LAB — HEPATITIS B CORE ANTIBODY, TOTAL: Hep B Core Total Ab: NEGATIVE

## 2015-06-24 LAB — CBC
HCT: 28 % — ABNORMAL LOW (ref 36.0–46.0)
HEMOGLOBIN: 7.7 g/dL — AB (ref 12.0–15.0)
MCH: 20.8 pg — AB (ref 26.0–34.0)
MCHC: 27.5 g/dL — ABNORMAL LOW (ref 30.0–36.0)
MCV: 75.5 fL — ABNORMAL LOW (ref 78.0–100.0)
Platelets: 218 10*3/uL (ref 150–400)
RBC: 3.71 MIL/uL — ABNORMAL LOW (ref 3.87–5.11)
RDW: 19 % — ABNORMAL HIGH (ref 11.5–15.5)
WBC: 6.8 10*3/uL (ref 4.0–10.5)

## 2015-06-24 LAB — HEPATITIS C ANTIBODY (REFLEX): HCV Ab: <0.1 s/co ratio (ref 0.0–0.9)

## 2015-06-24 LAB — HCV COMMENT:

## 2015-06-24 LAB — AMMONIA: AMMONIA: 39 umol/L — AB (ref 9–35)

## 2015-06-24 LAB — BRAIN NATRIURETIC PEPTIDE: B Natriuretic Peptide: 1007.4 pg/mL — ABNORMAL HIGH (ref 0.0–100.0)

## 2015-06-24 LAB — HEPATITIS B SURFACE ANTIBODY,QUALITATIVE: HEP B S AB: NONREACTIVE

## 2015-06-24 MED ORDER — SODIUM CHLORIDE 0.9 % IV SOLN
INTRAVENOUS | Status: DC
  Administered 2015-06-24: 2.8 [IU]/h via INTRAVENOUS
  Filled 2015-06-24: qty 2.5

## 2015-06-24 MED ORDER — PRAVASTATIN SODIUM 40 MG PO TABS
80.0000 mg | ORAL_TABLET | Freq: Every evening | ORAL | Status: DC
  Administered 2015-06-24 – 2015-06-28 (×5): 80 mg via ORAL
  Filled 2015-06-24 (×6): qty 2

## 2015-06-24 MED ORDER — INSULIN ASPART 100 UNIT/ML ~~LOC~~ SOLN
0.0000 [IU] | Freq: Every day | SUBCUTANEOUS | Status: DC
  Administered 2015-06-24: 4 [IU] via SUBCUTANEOUS
  Administered 2015-06-25: 2 [IU] via SUBCUTANEOUS
  Administered 2015-06-26: 3 [IU] via SUBCUTANEOUS
  Administered 2015-06-27: 2 [IU] via SUBCUTANEOUS

## 2015-06-24 MED ORDER — INSULIN ASPART 100 UNIT/ML ~~LOC~~ SOLN
0.0000 [IU] | Freq: Three times a day (TID) | SUBCUTANEOUS | Status: DC
  Administered 2015-06-24: 11 [IU] via SUBCUTANEOUS
  Administered 2015-06-25: 7 [IU] via SUBCUTANEOUS
  Administered 2015-06-25: 11 [IU] via SUBCUTANEOUS
  Administered 2015-06-25: 20 [IU] via SUBCUTANEOUS
  Administered 2015-06-26: 15 [IU] via SUBCUTANEOUS
  Administered 2015-06-26: 7 [IU] via SUBCUTANEOUS
  Administered 2015-06-27: 3 [IU] via SUBCUTANEOUS
  Administered 2015-06-27: 20 [IU] via SUBCUTANEOUS
  Administered 2015-06-27: 7 [IU] via SUBCUTANEOUS
  Administered 2015-06-28: 11 [IU] via SUBCUTANEOUS
  Administered 2015-06-28: 15 [IU] via SUBCUTANEOUS
  Administered 2015-06-28: 3 [IU] via SUBCUTANEOUS

## 2015-06-24 MED ORDER — FLUCONAZOLE 100 MG PO TABS
100.0000 mg | ORAL_TABLET | Freq: Once | ORAL | Status: AC
  Administered 2015-06-25: 100 mg via ORAL
  Filled 2015-06-24: qty 1

## 2015-06-24 MED ORDER — PREDNISONE 20 MG PO TABS
40.0000 mg | ORAL_TABLET | Freq: Every day | ORAL | Status: DC
  Administered 2015-06-25 – 2015-06-27 (×3): 40 mg via ORAL
  Filled 2015-06-24 (×2): qty 4
  Filled 2015-06-24 (×2): qty 2

## 2015-06-24 MED ORDER — INSULIN DETEMIR 100 UNIT/ML ~~LOC~~ SOLN
35.0000 [IU] | Freq: Every day | SUBCUTANEOUS | Status: DC
  Administered 2015-06-24 – 2015-06-25 (×2): 35 [IU] via SUBCUTANEOUS
  Filled 2015-06-24 (×3): qty 0.35

## 2015-06-24 MED ORDER — PANTOPRAZOLE SODIUM 40 MG PO TBEC
40.0000 mg | DELAYED_RELEASE_TABLET | Freq: Every day | ORAL | Status: DC
Start: 2015-06-24 — End: 2015-06-29
  Administered 2015-06-25 – 2015-06-29 (×5): 40 mg via ORAL
  Filled 2015-06-24 (×5): qty 1

## 2015-06-24 MED ORDER — IPRATROPIUM-ALBUTEROL 0.5-2.5 (3) MG/3ML IN SOLN
3.0000 mL | Freq: Four times a day (QID) | RESPIRATORY_TRACT | Status: DC
  Administered 2015-06-24 – 2015-06-25 (×4): 3 mL via RESPIRATORY_TRACT
  Filled 2015-06-24 (×4): qty 3

## 2015-06-24 MED ORDER — CARVEDILOL 12.5 MG PO TABS
12.5000 mg | ORAL_TABLET | Freq: Two times a day (BID) | ORAL | Status: DC
  Administered 2015-06-24 – 2015-06-29 (×10): 12.5 mg via ORAL
  Filled 2015-06-24 (×10): qty 1

## 2015-06-24 MED ORDER — AZITHROMYCIN 500 MG PO TABS
500.0000 mg | ORAL_TABLET | Freq: Every day | ORAL | Status: AC
  Administered 2015-06-24 – 2015-06-28 (×5): 500 mg via ORAL
  Filled 2015-06-24 (×6): qty 1

## 2015-06-24 NOTE — Progress Notes (Signed)
RT NOTE:  RT switched patient from BIPAP machine to Auto CPAP QHS per MD notes.

## 2015-06-24 NOTE — Progress Notes (Signed)
PULMONARY / CRITICAL CARE MEDICINE   Name: Tamara Silva MRN: [id number] DOB: Sep 15, 1960    ADMISSION DATE:  06/20/2015 CONSULTATION DATE:  1/22  REFERRING MD:  Tamara Silva   CHIEF COMPLAINT:  Acute encephalopathy and progressive hypercarbia   HISTORY OF PRESENT ILLNESS:   49 YOF w/ sig h/o IDDM, systolic and diastolic HF w/ EF [id number], chronic respiratory failure in the setting of  COPD, OSA and h/o PE (? Treated). Presented to the ED on 1/20 initially w/ reported unintentional insulin overdose (initial glucose on EMS arrival was 16). In the ED, her blood sugar was 100, and electrolytes were normal. She was groggy and lethargic and complained of some dyspnea. During her workup, she was noted incidentally have elevated BNP, minimally elevated TNI, and pulmonary edema appearing on chest x-ray. Shortly after admit did c/o increasing anxiety, this progressed to worsening confusion going into 1/22. It was initially felt to be due to scheduled xanax so this was held. Over the course of the day her work of breathing seemed to worsen, an ammonia was obtained and noted to be high so this was treated w/ lactulose. Her work of breathing continued to worsen so an ABG was obtained showing severe respiratory acidosis. She was placed on NIPPV and PCCM was asked to assess and assist w/ care.  SUBJECTIVE:  Wore BiPAp qhs, tells me that she does not have / use at home Refused lactulose Insulin gtt was started overnight, most recent CBG 224  VITAL SIGNS: BP 126/72 mmHg  Pulse 65  Temp(Src) 97 F (36.1 C) (Axillary)  Resp 22  Ht 5' 7.5" (1.715 m)  Wt 128 kg (282 lb 3 oz)  BMI 43.52 kg/m2  SpO2 100%  LMP 05/30/2013  HEMODYNAMICS:    VENTILATOR SETTINGS: Vent Mode:  [-] BIPAP FiO2 (%):  [30 %-40 %] 30 % Set Rate:  [12 bmp] 12 bmp PEEP:  [8 cmH20] 8 cmH20 Plateau Pressure:  [10 cmH20-15 cmH20] 15 cmH20  INTAKE / OUTPUT: I/O last 3 completed shifts: In: 1290 [P.O.:480; I.V.:710; IV Piggyback:100] Out:  1900 [Urine:1900]  PHYSICAL EXAMINATION: General:  NAD, awake and comfortable Neuro:  Awake and interacting, follows commands HEENT:  Tamara Silva Cardiovascular:  RRR Lungs:  Small volumes, wheeze has resolved Abdomen:  Obese, NT/ND, +BS.  Ext: Warm, no LE edema   LABS:  BMET  Recent Labs Lab 06/22/15 1738 06/23/15 0245 06/24/15 0350  NA 138  139 138 135  K 5.0  5.0 5.5* 5.2*  CL 94*  94* 94* 92*  CO2 38*  39* 37* 39*  BUN 14  15 19  32*  CREATININE 1.00  0.95 0.97 1.07*  GLUCOSE 233*  229* 216* 378*    Electrolytes  Recent Labs Lab 06/22/15 1738 06/23/15 0245 06/23/15 0453 06/24/15 0350  CALCIUM 8.5*  8.5* 8.5*  --  8.3*  MG 1.9  --  1.9  --   PHOS 3.4  --   --   --     CBC  Recent Labs Lab 06/22/15 1738 06/23/15 0245 06/24/15 0350  WBC 5.7 5.1 6.8  HGB 8.3* 8.1* 7.7*  HCT 31.0* 29.4* 28.0*  PLT 200 197 218    Coag's  Recent Labs Lab 06/22/15 1738  INR 1.24    Sepsis Markers  Recent Labs Lab 06/22/15 1738  LATICACIDVEN 0.7  PROCALCITON 0.40    ABG  Recent Labs Lab 06/22/15 2251 06/23/15 1315 06/24/15 0606  PHART 7.282* 7.336* 7.359  PCO2ART 90.4* 75.7* 70.1*  PO2ART 104.0* 110*  91.8    Liver Enzymes  Recent Labs Lab 06/22/15 1140 06/22/15 1738 06/23/15 0245  AST 14* 12* 15  ALT 15 16 16   ALKPHOS 58 57 49  BILITOT 0.3 0.5 0.5  ALBUMIN 2.6* 2.7*  2.7* 2.5*    Cardiac Enzymes  Recent Labs Lab 06/20/15 2336  TROPONINI 0.05*    Glucose  Recent Labs Lab 06/23/15 0817 06/23/15 1231 06/23/15 1532 06/23/15 1936 06/24/15 0007 06/24/15 0406  GLUCAP 212* 194* 191* 333* 393* 345*    Imaging Dg Chest Port 1 View  06/24/2015  CLINICAL DATA:  Asthma.  Central catheter placement EXAM: PORTABLE CHEST 1 VIEW COMPARISON:  June 22, 2015 FINDINGS: Central catheter tip is in the superior vena cava. No pneumothorax. There is scarring in the left mid lung peripheral region, stable. There is no frank edema or  consolidation. There is cardiomegaly with mild pulmonary venous hypertension, stable. No adenopathy. IMPRESSION: Evidence a degree of pulmonary vascular congestion, stable. Scarring on the left. No new opacity. Central catheter tip in superior vena cava. No pneumothorax. Electronically Signed   By: Lowella Grip III M.D.   On: 06/24/2015 07:40   US Abdomen Limited Ruq  06/23/2015  CLINICAL DATA:  Acute respiratory failure. Elevated ammonia levels, cirrhosis. EXAM: US ABDOMEN LIMITED - RIGHT UPPER QUADRANT COMPARISON:  None. FINDINGS: Gallbladder: The gallbladder is partially decompressed. No evidence of cholelithiasis. The gallbladder wall is thickened and edematous measuring 7 mm. Sonographic Murphy's sign was reported as negative. Common bile duct: Diameter: 3 mm Liver: No focal lesion identified. Within normal limits in parenchymal echogenicity. IMPRESSION: Gallbladder wall thickening and edema, without evidence of cholelithiasis. This may represent acalculous cholecystitis, in the appropriate clinical setting. Electronically Signed   By: Fidela Salisbury M.D.   On: 06/23/2015 12:45    LINES / TUBES: 1/20 PIV >> 1/22 Foley >>  CULTURES: MRSA PCR 1/22 >>  negative UCx 1/21>> BCx 1/21>> RVP 1/22>>  ANTIBIOTICS: Ceftriaxone 1/22>> azithro 1/22 >>  Fluconazole 1/23 >> 1/25  SIGNIFICANT EVENTS / STUDIES:  01/13/15 PSG with mild OSA and documented desats to 82%, not on home CPAP 1/21 Admit with hypoglycemia, with subsequent pulmonary edema on CXR.  Hypercap resp failure 1/23 RUQ [place] > GB wall thickening and edema without stones  ASSESSMENT / PLAN:  PULMONARY A: Acute on chronic hypercarbic respiratory failure  Acute pulmonary edema COPD w acute exacerbation Mild OSA, not on home CPAP ?CAP  ?Chronic pulmonary embolism P:   Change to nocturnal CPAP, auto-set device. Suspect she would benefit from this at home. She should qualify based on sx and her PSG from 01/13/15. Would try to  arrange for discharge if she is willing.  Cont nebulized BDs Solumedrol 60mg  q8h > wean to pred 40mg  on 1/24 Continuous pulse oximetry Follow cultures Abx as below  CARDIOVASCULAR A:  HTN CHF, echo 07/2014 LVEF: 40-45% CAD P:  Cont ASA, effient  Consider repeat TTE Likely restart lasix and aldactone on 1/25  RENAL A:   Hyperkalemia, Metabolic alkalosis P:   Monitor BMET Strict I/Os  GASTROINTESTINAL A:   GERD, on chronic PPI GI ppx Hyperammoniemia, etiology ?[name] Kitchen Consider acalculous cholecystitis, but clinical picture does not necessarily fit P:   Protonix to PO Lactulose stopped 1/24 Check ammonia trend 1/24 Hep panel pending  HEMATOLOGIC A:   Chronic microcytic anemia P:  VTE ppx: lovenox and SCDs Monitor cbc  INFECTIOUS A:   ?UTI Suspected CAP vs viral pneumonitis Yeast vaginitis P:   Follow CXR Follow  cx data abx as above  ENDOCRINE A:   DM II Hypoglycemia, resolved P:   Insulin gtt started 1/23 pm > plan restart levemir at 35u (below home dose) and then stop insulin gtt, convert to SSI   NEUROLOGIC A:  Hyperammonemia Acute encephalopathy - multifactorial, improved P:   Limit sedating medications   Discussed status with family at bedside 1/23, 1/24  Will transfer to floor bed and back to [facility] as of 1/25    Baltazar Apo, MD, PhD 06/24/2015, 11:19 AM [facility] [phone] or if no answer [phone]

## 2015-06-24 NOTE — Plan of Care (Signed)
Problem: Cardiac: Goal: Ability to achieve and maintain adequate cardiopulmonary perfusion will improve Outcome: Progressing Patient's heart rate drops to 54-59 while sleeping and comes back up to 58-66 when awake.   Problem: Health Behavior/Discharge Planning: Goal: Ability to manage health-related needs will improve for discharge Outcome: Progressing Patient's blood sugar was 333, 393, 345. Covered with insulin. Patient had the bipap on over the night, 30%.

## 2015-06-24 NOTE — Progress Notes (Signed)
[facility] Progress Note Patient Name: Tamara Silva DOB: 1961-01-20 MRN: [id number]   Date of Service  06/24/2015  HPI/Events of Note  Multiple issues: 1. Blood glucose = 333 >> 393 >> 345 in spite of moderate Novolog SSI coverage. 2. Do you want AM ABG?  eICU Interventions  Will order: 1. Insulin IV infusion per protocol. 2. ABG now.      Intervention Category Major Interventions: Hyperglycemia - active titration of insulin therapy;Respiratory failure - evaluation and management  Sommer,Steven Cornelia Copa 06/24/2015, 5:17 AM

## 2015-06-25 LAB — GLUCOSE, CAPILLARY
GLUCOSE-CAPILLARY: 224 mg/dL — AB (ref 65–99)
Glucose-Capillary: 297 mg/dL — ABNORMAL HIGH (ref 65–99)
Glucose-Capillary: 351 mg/dL — ABNORMAL HIGH (ref 65–99)
Glucose-Capillary: 227 mg/dL — ABNORMAL HIGH (ref 65–99)

## 2015-06-25 MED ORDER — FUROSEMIDE 10 MG/ML IJ SOLN
80.0000 mg | Freq: Two times a day (BID) | INTRAMUSCULAR | Status: DC
  Administered 2015-06-25 – 2015-06-28 (×7): 80 mg via INTRAVENOUS
  Filled 2015-06-25 (×6): qty 8

## 2015-06-25 MED ORDER — FUROSEMIDE 10 MG/ML IJ SOLN
INTRAMUSCULAR | Status: AC
  Filled 2015-06-25: qty 8

## 2015-06-25 MED ORDER — IPRATROPIUM-ALBUTEROL 0.5-2.5 (3) MG/3ML IN SOLN
3.0000 mL | Freq: Three times a day (TID) | RESPIRATORY_TRACT | Status: DC
  Administered 2015-06-26 – 2015-06-29 (×10): 3 mL via RESPIRATORY_TRACT
  Filled 2015-06-25 (×10): qty 3

## 2015-06-25 MED ORDER — ENOXAPARIN SODIUM 60 MG/0.6ML ~~LOC~~ SOLN
60.0000 mg | Freq: Every day | SUBCUTANEOUS | Status: DC
  Administered 2015-06-26 – 2015-06-29 (×4): 60 mg via SUBCUTANEOUS
  Filled 2015-06-25 (×4): qty 0.6

## 2015-06-25 NOTE — Progress Notes (Signed)
[facility] TEAM 1 - Stepdown/ICU TEAM PROGRESS NOTE  Carlota Merwin [id number] DOB: 1961-05-28 DOA: 06/20/2015 PCP: Angelica Chessman, MD  Admit HPI / Brief Narrative: 55yo F w/ h/o DM, systolic and diastolic HF w/ EF [id number], COPD, OSA, and PE who presented to the ED on 1/20 w/ reported unintentional insulin overdose (initial glucose on EMS arrival was 16). In the ED, her blood sugar was 100, and electrolytes were normal. She was groggy and lethargic and complained of some dyspnea. During her workup, she was noted to have an elevated BNP, minimally elevated TNI, and pulmonary edema on chest x-ray.   Shortly after admit she developed increasing anxiety, which progressed to worsening confusion going into 1/22. It was initially felt to be due to xanax so this was held. Over the course of the day her work of breathing seemed to worsen.  Ammonia was noted to be high so this was treated w/ lactulose. Her work of breathing continued to worsen and an ABG noted severe respiratory acidosis. She was placed on NIPPV and PCCM was asked to assess and assist w/ care.  Significant Events: 1/21 Admit with hypoglycemia, with subsequent pulmonary edema on CXR. Hypercap resp failure 1/23 RUQ [place] > GB wall thickening and edema without stones 1/25 [facility] assumed care   HPI/Subjective: The pt is resting comfortably in a bedside chair.  She denies cp, n/v, abdom pain, diarrhea.  She says she is mildly sob, but "better than I was."  She appears to be mildly sluggish in responding during conversation.    Assessment/Plan:  Acute encephalopathy - multifactorial Hypercarbia appears to be managed at present - recheck ammonia in AM - complete metabolic w/u w/ [id number] acid/RPR/TSH  Hyperammoniemia Etiology? - essentially resolved - recheck in AM   DM II w/ severe Hypoglycemia CBG remains erratic, but no hypoglycemia of late - follow w/o change in tx plan today   Acute on chronic hypercarbic respiratory failure - COPD  w acute exacerbation Wean O2 as able - ambulatory sat eval prior to d/c - no wheezing presently   ?CAP doubt - no evidence of a signif infiltrate on CXR - WBC normal    Acute exacerbation of Chronic systolic CHF - Acute pulmonary edema TTE 07/2014 EF 40-45% - admit weight 134kg - presently 131kg - baseline quite variable but appears to frequently have weights in the mid to low 120kg range - net negative ~4L thus far - cont to diurese as able   Mild OSA, not on home CPAP 01/13/15 PSG with mild OSA and documented desats to 82% - not on home CPAP - PCCM has been using nocturnal CPAP, auto-set device - per PCCM "Suspect she would benefit from this at home. She should qualify based on sx and her PSG from 01/13/15. Would try to arrange for discharge if she is willing."  HTN BP currently well controlled   CAD Cont ASA, effient - asymptomatic   Hyperkalemia Follow w/ diuresis   GERD on chronic PPI  Chronic microcytic anemia Check anemia panel  Yeast vaginitis To complete 3 days of diflucan   Morbid obesity - Body mass index is 44.64 kg/(m^2).  Code Status: FULL Family Communication: no family present at time of exam Disposition Plan: transfer to tele bed - PT/OT - d/c foley - cont diuresis - ambulatory sat eval   Consultants: PCCM  Antibiotics: Ceftriaxone 1/22 > Azithro 1/22 >  Fluconazole 1/23 > 1/25  DVT prophylaxis: lovenox   Objective: Blood pressure 121/67, pulse 58, temperature  97.7 F (36.5 C), temperature source Oral, resp. rate 29, height 5' 7.5" (1.715 m), weight 131.3 kg (289 lb 7.4 oz), last menstrual period 05/30/2013, SpO2 100 %.  Intake/Output Summary (Last 24 hours) at 06/25/15 0855 Last data filed at 06/25/15 0800  Gross per 24 hour  Intake 1407.2 ml  Output   2000 ml  Net -592.8 ml   Exam: General: No acute respiratory distress at rest  Lungs: Clear to auscultation bilaterally without wheezes or crackles Cardiovascular: Regular rate and rhythm  without murmur gallop or rub normal S1 and S2 Abdomen: Nontender, morbidly obese, soft, bowel sounds positive, no rebound, no ascites, no appreciable mass Extremities: No significant cyanosis, or clubbing;  1+ edema bilateral lower extremities  Data Reviewed: Basic Metabolic Panel:  Recent Labs Lab 06/20/15 2336 06/22/15 0512 06/22/15 1738 06/23/15 0245 06/23/15 0453 06/24/15 0350  NA 138 138 138  139 138  --  135  K 4.6 5.4* 5.0  5.0 5.5*  --  5.2*  CL 102 96* 94*  94* 94*  --  92*  CO2 29 36* 38*  39* 37*  --  39*  GLUCOSE 79 214* 233*  229* 216*  --  378*  BUN 10 16 14  15 19   --  32*  CREATININE 1.02* 0.96 1.00  0.95 0.97  --  1.07*  CALCIUM 8.1* 8.5* 8.5*  8.5* 8.5*  --  8.3*  MG  --   --  1.9  --  1.9  --   PHOS  --   --  3.4  --   --   --    CBC:  Recent Labs Lab 06/20/15 2336 06/22/15 1738 06/23/15 0245 06/24/15 0350  WBC 8.9 5.7 5.1 6.8  HGB 8.6* 8.3* 8.1* 7.7*  HCT 29.9* 31.0* 29.4* 28.0*  MCV 75.5* 79.7 78.4 75.5*  PLT 173 200 197 218    Liver Function Tests:  Recent Labs Lab 06/20/15 2336 06/22/15 1140 06/22/15 1738 06/23/15 0245  AST 18 14* 12* 15  ALT 17 15 16 16   ALKPHOS 65 58 57 49  BILITOT 0.3 0.3 0.5 0.5  PROT 6.8 6.8 7.2 7.0  ALBUMIN 2.6* 2.6* 2.7*  2.7* 2.5*    Recent Labs Lab 06/22/15 1140 06/24/15 0350  AMMONIA 72* 39*   Coags:  Recent Labs Lab 06/22/15 1738  INR 1.24   Cardiac Enzymes:  Recent Labs Lab 06/20/15 2336  TROPONINI 0.05*    CBG:  Recent Labs Lab 06/24/15 1149 06/24/15 1254 06/24/15 1411 06/24/15 1716 06/24/15 2155  GLUCAP 183* 135* 117* 257* 340*    Recent Results (from the past 240 hour(s))  Culture, Urine     Status: None   Collection Time: 06/21/15  2:34 PM  Result Value Ref Range Status   Specimen Description URINE, CATHETERIZED  Final   Special Requests Normal  Final   Culture MULTIPLE SPECIES PRESENT, SUGGEST RECOLLECTION  Final   Report Status 06/23/2015 FINAL  Final   Culture, blood (Routine X 2) w Reflex to ID Panel     Status: None (Preliminary result)   Collection Time: 06/21/15  3:04 PM  Result Value Ref Range Status   Specimen Description BLOOD BLOOD LEFT ARM  Final   Special Requests BOTTLES DRAWN AEROBIC AND ANAEROBIC 10CC  Final   Culture NO GROWTH 3 DAYS  Final   Report Status PENDING  Incomplete  Culture, blood (Routine X 2) w Reflex to ID Panel     Status: None (Preliminary result)  Collection Time: 06/21/15  3:16 PM  Result Value Ref Range Status   Specimen Description BLOOD BLOOD RIGHT HAND  Final   Special Requests BOTTLES DRAWN AEROBIC AND ANAEROBIC 4CC  Final   Culture NO GROWTH 3 DAYS  Final   Report Status PENDING  Incomplete  Respiratory virus panel     Status: None   Collection Time: 06/22/15  4:41 PM  Result Value Ref Range Status   Respiratory Syncytial Virus A Negative Negative Final   Respiratory Syncytial Virus B Negative Negative Final   Influenza A Negative Negative Final   Influenza B Negative Negative Final   Parainfluenza 1 Negative Negative Final   Parainfluenza 2 Negative Negative Final   Parainfluenza 3 Negative Negative Final   Metapneumovirus Negative Negative Final   Rhinovirus Negative Negative Final   Adenovirus Negative Negative Final    Comment: (NOTE) Performed At: [facility] [place], [place] [id number] Lindon Romp MD [id number]   MRSA PCR Screening     Status: None   Collection Time: 06/22/15  4:42 PM  Result Value Ref Range Status   MRSA by PCR NEGATIVE NEGATIVE Final    Comment:        The GeneXpert MRSA Assay (FDA approved for NASAL specimens only), is one component of a comprehensive MRSA colonization surveillance program. It is not intended to diagnose MRSA infection nor to guide or monitor treatment for MRSA infections.      Studies:   Recent x-ray studies have been reviewed in detail by the Attending Physician  Scheduled Meds:  Scheduled  Meds: . antiseptic oral rinse  7 mL Mouth Rinse BID  . aspirin EC  81 mg Oral Daily  . azithromycin  500 mg Oral Daily  . carvedilol  12.5 mg Oral BID WC  . cefTRIAXone (ROCEPHIN)  IV  1 g Intravenous Q24H  . enoxaparin (LOVENOX) injection  40 mg Subcutaneous Daily  . fluconazole  100 mg Oral Once  . insulin aspart  0-20 Units Subcutaneous TID WC  . insulin aspart  0-5 Units Subcutaneous QHS  . insulin detemir  35 Units Subcutaneous QHS  . ipratropium-albuterol  3 mL Nebulization QID  . pantoprazole  40 mg Oral Daily  . prasugrel  10 mg Oral Daily  . pravastatin  80 mg Oral QPM  . predniSONE  40 mg Oral Q breakfast  . sodium chloride  10-40 mL Intracatheter Q12H  . sodium chloride  3 mL Intravenous Q12H    Time spent on care of this patient: 35 mins   MCCLUNG,JEFFREY T , MD   [facility] Office  [phone] Pager - Text Page per Shea Evans as per below:  On-Call/Text Page:      [email or web address]      password [email or web address]  If 7PM-7AM, please contact night-coverage [email or web address] Password [email or web address] 06/25/2015, 8:55 AM   LOS: 4 days

## 2015-06-25 NOTE — Care Management Note (Signed)
Case Management Note  Patient Details  Name: Tamara Silva MRN: [id number] Date of Birth: 27-Apr-1961  Subjective/Objective:   Per patient she lives at home with family.  Has oxygen from [place] at home.  Has a portable tank.     Does not have CPAP, walks with a cane, has a BSC, no shower chair.  Wants rehab to get stronger, has a very congested cough.  PT/OT consult in.              Action/Plan:   Expected Discharge Date:                  Expected Discharge Plan:  [place]  In-House Referral:     Discharge planning Services  CM Consult  Post Acute Care Choice:    Choice offered to:     DME Arranged:    DME Agency:     HH Arranged:    [place] Agency:     Status of Service:  In process, will continue to follow  Medicare Important Message Given:    Date Medicare IM Given:    Medicare IM give by:    Date Additional Medicare IM Given:    Additional Medicare Important Message give by:     If discussed at [place] of Stay Meetings, dates discussed:    Additional Comments:  Vergie Living, RN 06/25/2015, 12:00 PM

## 2015-06-25 NOTE — Progress Notes (Signed)
Patient requesting multiple beverages to be kept full at bedside.  Discussed treatment plan at this time in regards to fluid removal with lasix and that I could not allow her to drink as much or more fluids than she was putting out.  I offered suggestions to have family bring in "sugar-less" hard candies or "sugar-free" gum to help with her mouth being dry and help with feelings of thirst.   Richardean Canal RN, BSN, CCRN

## 2015-06-26 DIAGNOSIS — G4733 Obstructive sleep apnea (adult) (pediatric): Secondary | ICD-10-CM | POA: Diagnosis present

## 2015-06-26 DIAGNOSIS — G9340 Encephalopathy, unspecified: Secondary | ICD-10-CM | POA: Diagnosis present

## 2015-06-26 DIAGNOSIS — J9622 Acute and chronic respiratory failure with hypercapnia: Secondary | ICD-10-CM | POA: Diagnosis present

## 2015-06-26 DIAGNOSIS — J189 Pneumonia, unspecified organism: Secondary | ICD-10-CM | POA: Diagnosis present

## 2015-06-26 DIAGNOSIS — E1100 Type 2 diabetes mellitus with hyperosmolarity without nonketotic hyperglycemic-hyperosmolar coma (NKHHC): Secondary | ICD-10-CM

## 2015-06-26 LAB — COMPREHENSIVE METABOLIC PANEL
ALT: 18 U/L (ref 14–54)
AST: 13 U/L — ABNORMAL LOW (ref 15–41)
Albumin: 2.5 g/dL — ABNORMAL LOW (ref 3.5–5.0)
Alkaline Phosphatase: 57 U/L (ref 38–126)
Anion gap: 11 (ref 5–15)
BILIRUBIN TOTAL: 0.5 mg/dL (ref 0.3–1.2)
BUN: 25 mg/dL — AB (ref 6–20)
CO2: 43 mmol/L — ABNORMAL HIGH (ref 22–32)
Calcium: 8.7 mg/dL — ABNORMAL LOW (ref 8.9–10.3)
Chloride: 90 mmol/L — ABNORMAL LOW (ref 101–111)
Creatinine, Ser: 0.85 mg/dL (ref 0.44–1.00)
GFR calc Af Amer: >60 mL/min (ref >60)
Glucose, Bld: 143 mg/dL — ABNORMAL HIGH (ref 65–99)
Potassium: 4.7 mmol/L (ref 3.5–5.1)
Sodium: 144 mmol/L (ref 135–145)
TOTAL PROTEIN: 6.6 g/dL (ref 6.5–8.1)

## 2015-06-26 LAB — VITAMIN B12: VITAMIN B 12: 921 pg/mL — AB (ref 180–914)

## 2015-06-26 LAB — CBC
HCT: 30.3 % — ABNORMAL LOW (ref 36.0–46.0)
Hemoglobin: 8.5 g/dL — ABNORMAL LOW (ref 12.0–15.0)
MCH: 21.7 pg — ABNORMAL LOW (ref 26.0–34.0)
MCHC: 28.1 g/dL — ABNORMAL LOW (ref 30.0–36.0)
MCV: 77.3 fL — ABNORMAL LOW (ref 78.0–100.0)
Platelets: 248 10*3/uL (ref 150–400)
RBC: 3.92 MIL/uL (ref 3.87–5.11)
RDW: 19.2 % — AB (ref 11.5–15.5)
WBC: 9.3 10*3/uL (ref 4.0–10.5)

## 2015-06-26 LAB — GLUCOSE, CAPILLARY
GLUCOSE-CAPILLARY: 101 mg/dL — AB (ref 65–99)
GLUCOSE-CAPILLARY: 215 mg/dL — AB (ref 65–99)
Glucose-Capillary: 319 mg/dL — ABNORMAL HIGH (ref 65–99)
Glucose-Capillary: 298 mg/dL — ABNORMAL HIGH (ref 65–99)

## 2015-06-26 LAB — HIV ANTIBODY (ROUTINE TESTING W REFLEX): HIV Screen 4th Generation wRfx: NONREACTIVE

## 2015-06-26 LAB — FERRITIN: Ferritin: 27 ng/mL (ref 11–307)

## 2015-06-26 LAB — CULTURE, BLOOD (ROUTINE X 2)
CULTURE: NO GROWTH
Culture: NO GROWTH

## 2015-06-26 LAB — RETICULOCYTES
RBC.: 3.92 MIL/uL (ref 3.87–5.11)
RETIC CT PCT: 1.3 % (ref 0.4–3.1)
Retic Count, Absolute: 51 10*3/uL (ref 19.0–186.0)

## 2015-06-26 LAB — IRON AND TIBC
Iron: 15 ug/dL — ABNORMAL LOW (ref 28–170)
Saturation Ratios: 4 % — ABNORMAL LOW (ref 10.4–31.8)
TIBC: 405 ug/dL (ref 250–450)
UIBC: 390 ug/dL

## 2015-06-26 LAB — AMMONIA: Ammonia: 32 umol/L (ref 9–35)

## 2015-06-26 LAB — FOLATE: FOLATE: 6.3 ng/mL (ref >5.9)

## 2015-06-26 LAB — RPR: RPR: NONREACTIVE

## 2015-06-26 MED ORDER — INSULIN DETEMIR 100 UNIT/ML ~~LOC~~ SOLN
42.0000 [IU] | Freq: Every day | SUBCUTANEOUS | Status: DC
  Filled 2015-06-26 (×4): qty 0.42

## 2015-06-26 NOTE — Progress Notes (Signed)
[place] TEAM 1 - Stepdown/ICU TEAM Progress Note  Toluwani Sailer [id number] DOB: 12-02-1960 DOA: 06/20/2015 PCP: Angelica Chessman, MD  Admit HPI / Brief Narrative: 55yo F PMHx  DM Type 2, Systolic and Diastolic HF w/ EF [id number], COPD On home O2 of 2 L, OSA/Obesity Hypoventilation Syndrome, and PE   Who presented to the ED on 1/20 w/ reported unintentional insulin overdose (initial glucose on EMS arrival was 16). In the ED, her blood sugar was 100, and electrolytes were normal. She was groggy and lethargic and complained of some dyspnea. During her workup, she was noted to have an elevated BNP, minimally elevated TNI, and pulmonary edema on chest x-ray.   Shortly after admit she developed increasing anxiety, which progressed to worsening confusion going into 1/22. It was initially felt to be due to xanax so this was held. Over the course of the day her work of breathing seemed to worsen. Ammonia was noted to be high so this was treated w/ lactulose. Her work of breathing continued to worsen and an ABG noted severe respiratory acidosis. She was placed on NIPPV and PCCM was asked to assess and assist w/ care.   HPI/Subjective: 1/26 A/O 4, states recalls passing out was found down by her boyfriend. On home O2 of 2 L at night and sleeping occasionally during the day. Patient does not know her base weight, nor does she weigh self daily   Assessment/Plan:  Acute encephalopathy - multifactorial -resolved  -Ammonia WNL  -B 12 elevated/folate WNL -RPR negative -TSH WNL  Hyperammoniemia -Etiology?  -Resolved   DM II uncontrolled w/ severe Hypoglycemia@@@@ -12/21 hemoglobin A1c= 7.9 -Increase Lantus to 42 units QHS -Continue resistant SSI  Acute on chronic hypercarbic respiratory failure - COPD w acute exacerbation -Wean O2 as able ( On home O2 of 2 L)  ?CAP -Complete 7 day course of antibiotics -Albuterol PRN -DuoNeb TID -Prednisone 40 mg daily   Acute exacerbation of  Chronic systolic CHF - Acute pulmonary edema -TTE 07/2014 EF 40-45%  -Daily weight admit weight =134kg         1/26 weight= 128.3 kg  -Continue Lasix 80 mg BID  Mild OSA, not on home CPAP -01/13/15 PSG with mild OSA and documented desats to 82% - not on home CPAP - PCCM has been using nocturnal CPAP, auto-set device - per PCCM "Suspect she would benefit from this at home. She should qualify based on sx and her PSG from 01/13/15. Would try to arrange for discharge if she is willing."  HTN -BP currently well controlled   CAD native artery Cont ASA 81 mg daily -Effient 10 mg daily   Hyperkalemia -Resolved   GERD on chronic PPI  Chronic microcytic anemia -Check anemia panel  Yeast vaginitis -Complete 3 days of diflucan   Morbid obesity - Body mass index is 44.64 kg/(m^2). -PT/OT recommends no follow-up    Code Status: FULL Family Communication: no family present at time of exam Disposition Plan: After fully diuresed    Consultants: Salley Scarlet [place] [facility]   Procedure/Significant Events: 1/21 Admit with hypoglycemia, with subsequent pulmonary edema on CXR. Hypercap resp failure 1/23 RUQ [place] > GB wall thickening and edema without stones 1/25 TRH assumed care    Culture   Antibiotics: Azithromycin 1/22>> Ceftriaxone 1/22>>   DVT prophylaxis: Lovenox   Devices Negative   LINES / TUBES:  NA    Continuous Infusions: . sodium chloride      Objective: VITAL SIGNS: Temp: 98 F (  36.7 C) (01/26 1508) Temp Source: Oral (01/26 1508) BP: 122/64 mmHg (01/26 1508) Pulse Rate: 72 (01/26 1508) SPO2; FIO2:   Intake/Output Summary (Last 24 hours) at 06/26/15 2015 Last data filed at 06/26/15 1821  Gross per 24 hour  Intake     10 ml  Output   4775 ml  Net  -4765 ml     Exam: General:A/O 4, states recalls passing out was found down by her boyfriend, No acute respiratory distress Eyes: Negative headache, negative scleral hemorrhage ENT: Negative  Runny nose, negative gingival bleeding, Neck:  Negative scars, masses, torticollis, lymphadenopathy, JVD Lungs: diffuse poor air movement, mild diffuse expiratory wheezing, negative  crackles Cardiovascular: Regular rate and rhythm without murmur gallop or rub normal S1 and S2 Abdomen: Morbidly obese, negative abdominal pain, nondistended, positive soft, bowel sounds, no rebound, no ascites, no appreciable mass Extremities: No significant cyanosis, clubbing. Positive bilateral lower extremity edema Psychiatric:  Negative depression, negative anxiety, negative fatigue, negative mania  Neurologic:  Cranial nerves II through XII intact, tongue/uvula midline, all extremities muscle strength 5/5, sensation intact throughout, negative dysarthria, negative expressive aphasia, negative receptive aphasia.   Data Reviewed: Basic Metabolic Panel:  Recent Labs Lab 06/22/15 0512 06/22/15 1738 06/23/15 0245 06/23/15 0453 06/24/15 0350 06/26/15 0405  NA 138 138  139 138  --  135 144  K 5.4* 5.0  5.0 5.5*  --  5.2* 4.7  CL 96* 94*  94* 94*  --  92* 90*  CO2 36* 38*  39* 37*  --  39* 43*  GLUCOSE 214* 233*  229* 216*  --  378* 143*  BUN 16 14  15 19   --  32* 25*  CREATININE 0.96 1.00  0.95 0.97  --  1.07* 0.85  CALCIUM 8.5* 8.5*  8.5* 8.5*  --  8.3* 8.7*  MG  --  1.9  --  1.9  --   --   PHOS  --  3.4  --   --   --   --    Liver Function Tests:  Recent Labs Lab 06/20/15 2336 06/22/15 1140 06/22/15 1738 06/23/15 0245 06/26/15 0405  AST 18 14* 12* 15 13*  ALT 17 15 16 16 18   ALKPHOS 65 58 57 49 57  BILITOT 0.3 0.3 0.5 0.5 0.5  PROT 6.8 6.8 7.2 7.0 6.6  ALBUMIN 2.6* 2.6* 2.7*  2.7* 2.5* 2.5*   No results for input(s): LIPASE, AMYLASE in the last 168 hours.  Recent Labs Lab 06/22/15 1140 06/24/15 0350 06/26/15 0405  AMMONIA 72* 39* 32   CBC:  Recent Labs Lab 06/20/15 2336 06/22/15 1738 06/23/15 0245 06/24/15 0350 06/26/15 0405  WBC 8.9 5.7 5.1 6.8 9.3  HGB 8.6*  8.3* 8.1* 7.7* 8.5*  HCT 29.9* 31.0* 29.4* 28.0* 30.3*  MCV 75.5* 79.7 78.4 75.5* 77.3*  PLT 173 200 197 218 248   Cardiac Enzymes:  Recent Labs Lab 06/20/15 2336  TROPONINI 0.05*   BNP (last 3 results)  Recent Labs  06/20/15 2337 06/24/15 0350  BNP 293.5* 1007.4*    ProBNP (last 3 results) No results for input(s): PROBNP in the last 8760 hours.  CBG:  Recent Labs Lab 06/25/15 1731 06/25/15 2147 06/26/15 0736 06/26/15 1154 06/26/15 1602  GLUCAP 351* 227* 101* 215* 319*    Recent Results (from the past 240 hour(s))  Culture, Urine     Status: None   Collection Time: 06/21/15  2:34 PM  Result Value Ref Range Status   Specimen Description  URINE, CATHETERIZED  Final   Special Requests Normal  Final   Culture MULTIPLE SPECIES PRESENT, SUGGEST RECOLLECTION  Final   Report Status 06/23/2015 FINAL  Final  Culture, blood (Routine X 2) w Reflex to ID Panel     Status: None   Collection Time: 06/21/15  3:04 PM  Result Value Ref Range Status   Specimen Description BLOOD BLOOD LEFT ARM  Final   Special Requests BOTTLES DRAWN AEROBIC AND ANAEROBIC 10CC  Final   Culture NO GROWTH 5 DAYS  Final   Report Status 06/26/2015 FINAL  Final  Culture, blood (Routine X 2) w Reflex to ID Panel     Status: None   Collection Time: 06/21/15  3:16 PM  Result Value Ref Range Status   Specimen Description BLOOD BLOOD RIGHT HAND  Final   Special Requests BOTTLES DRAWN AEROBIC AND ANAEROBIC 4CC  Final   Culture NO GROWTH 5 DAYS  Final   Report Status 06/26/2015 FINAL  Final  Respiratory virus panel     Status: None   Collection Time: 06/22/15  4:41 PM  Result Value Ref Range Status   Respiratory Syncytial Virus A Negative Negative Final   Respiratory Syncytial Virus B Negative Negative Final   Influenza A Negative Negative Final   Influenza B Negative Negative Final   Parainfluenza 1 Negative Negative Final   Parainfluenza 2 Negative Negative Final   Parainfluenza 3 Negative Negative  Final   Metapneumovirus Negative Negative Final   Rhinovirus Negative Negative Final   Adenovirus Negative Negative Final    Comment: (NOTE) Performed At: [facility] [place], [place] [id number] Lindon Romp MD [id number]   MRSA PCR Screening     Status: None   Collection Time: 06/22/15  4:42 PM  Result Value Ref Range Status   MRSA by PCR NEGATIVE NEGATIVE Final    Comment:        The GeneXpert MRSA Assay (FDA approved for NASAL specimens only), is one component of a comprehensive MRSA colonization surveillance program. It is not intended to diagnose MRSA infection nor to guide or monitor treatment for MRSA infections.      Studies:  Recent x-ray studies have been reviewed in detail by the Attending Physician  Scheduled Meds:  Scheduled Meds: . antiseptic oral rinse  7 mL Mouth Rinse BID  . aspirin EC  81 mg Oral Daily  . azithromycin  500 mg Oral Daily  . carvedilol  12.5 mg Oral BID WC  . enoxaparin (LOVENOX) injection  60 mg Subcutaneous Daily  . furosemide  80 mg Intravenous Q12H  . insulin aspart  0-20 Units Subcutaneous TID WC  . insulin aspart  0-5 Units Subcutaneous QHS  . insulin detemir  42 Units Subcutaneous QHS  . ipratropium-albuterol  3 mL Nebulization TID  . pantoprazole  40 mg Oral Daily  . prasugrel  10 mg Oral Daily  . pravastatin  80 mg Oral QPM  . predniSONE  40 mg Oral Q breakfast    Time spent on care of this patient: 40 mins   Mailen Newborn, Geraldo Docker , MD  [facility] Office  [phone] Pager - [phone]  On-Call/Text Page:      [email or web address]      password [email or web address]  If 7PM-7AM, please contact night-coverage [email or web address] Password [email or web address] 06/26/2015, 8:15 PM   LOS: 5 days   Care during the described time interval was provided by me .  I have reviewed this patient's available data, including medical history,  events of note, physical examination, and all test results as part of my evaluation. I have  personally reviewed and interpreted all radiology studies.   Dia Crawford, MD [phone] Pager

## 2015-06-26 NOTE — Evaluation (Signed)
Physical Therapy Evaluation Patient Details Name: Tamara Silva MRN: [id number] DOB: Dec 13, 1960 Today's Date: 06/26/2015   History of Present Illness  Pt admitted with hypoglycemia as a result of an unintentional insulin overdose, pulmonary edema, hypercarbic respiratory failure. PMH: DM, HF, COPD on home 02, PE, CAD, HTN, obesity.  Clinical Impression  Pt admitted with/for hypoglycemia from unintentional insulin overdose.  Pt currently limited functionally due to the problems listed below.  (see problems list.)  Pt will benefit from PT to maximize function and safety to be able to get home safely with available assist of boyfriend and daughter..     Follow Up Recommendations No PT follow up    Equipment Recommendations  None recommended by PT (TBA before d/c)    Recommendations for Other Services       Precautions / Restrictions Precautions Precautions: Fall Restrictions Weight Bearing Restrictions: No      Mobility  Bed Mobility Overal bed mobility: Modified Independent             General bed mobility comments: HOB up, uses momentum  Transfers Overall transfer level: Needs assistance   Transfers: Sit to/from Stand Sit to Stand: Supervision         General transfer comment: no physical assistance, supervision for safety and attention to 02 line  Ambulation/Gait Ambulation/Gait assistance: Supervision Ambulation Distance (Feet): 200 Feet Assistive device: Rolling walker (2 wheeled) Gait Pattern/deviations: Step-through pattern Gait velocity: slower Gait velocity interpretation: Below normal speed for age/gender General Gait Details: generally steady with the RW.  Slower speed.  distractable  Stairs            Wheelchair Mobility    Modified Rankin (Stroke Patients Only)       Balance Overall balance assessment: Needs assistance Sitting-balance support: No upper extremity supported Sitting balance-Leahy Scale: Good     Standing balance  support: No upper extremity supported Standing balance-Leahy Scale: Fair                               Pertinent Vitals/Pain Pain Assessment: No/denies pain    Home Living Family/patient expects to be discharged to:: Private residence Living Arrangements: Non-relatives/Friends (boyfriend, [name]) Available Help at Discharge: Friend(s);Family;Available PRN/intermittently (pt state she could be home for hours alone, Dtr could check ) Type of Home: House Home Access: Stairs to enter Entrance Stairs-Rails: None Entrance Stairs-Number of Steps: 2 Home Layout: One level Home Equipment: Cane - single point;Bedside commode;Other (comment) (02)      Prior Function Level of Independence: Independent with assistive device(s)         Comments: sometimes uses a cane     Hand Dominance   Dominant Hand: Right    Extremity/Trunk Assessment   Upper Extremity Assessment: Overall WFL for tasks assessed           Lower Extremity Assessment: Overall WFL for tasks assessed (mild hip flexor weakness)      Cervical / Trunk Assessment: Normal  Communication   Communication: No difficulties  Cognition Arousal/Alertness: Awake/alert Behavior During Therapy: Flat affect Overall Cognitive Status: No family/caregiver present to determine baseline cognitive functioning (slow processing)                      General Comments General comments (skin integrity, edema, etc.): when asked if she is back to her normal, pt just stares forward, and finally says no.    Exercises  Assessment/Plan    PT Assessment Patient needs continued PT services  PT Diagnosis Generalized weakness;Altered mental status (decr activity tolerance)   PT Problem List Decreased strength;Decreased activity tolerance;Decreased balance;Decreased mobility;Decreased knowledge of use of DME  PT Treatment Interventions DME instruction;Gait training;Functional mobility training;Therapeutic  activities;Balance training;Patient/family education   PT Goals (Current goals can be found in the Care Plan section) Acute Rehab PT Goals Patient Stated Goal: return home PT Goal Formulation: With patient Time For Goal Achievement: 07/03/15 Potential to Achieve Goals: Good    Frequency Min 3X/week   Barriers to discharge        Co-evaluation               End of Session Equipment Utilized During Treatment: Oxygen Activity Tolerance: Patient tolerated treatment well Patient left: in bed;with call bell/phone within reach (sitting EOB talking to boyfriend.) Nurse Communication: Mobility status         Time: [id number] PT Time Calculation (min) (ACUTE ONLY): 22 min   Charges:   PT Evaluation $PT Eval Low Complexity: 1 Procedure     PT G Codes:        Pia Jedlicka, Tessie Fass 06/26/2015, 5:00 PM  06/26/2015  Donnella Sham, [place] [phone]  (pager)

## 2015-06-26 NOTE — Evaluation (Signed)
Occupational Therapy Evaluation Patient Details Name: Tamara Silva MRN: [id number] DOB: April 20, 1961 Today's Date: 06/26/2015    History of Present Illness Pt admitted with hypoglycemia as a result of an unintentional insulin overdose, pulmonary edema, hypercarbic respiratory failure. PMH: DM, HF, COPD on home 02, PE, CAD, HTN, obesity.   Clinical Impression   Pt reports being independent in ADL and occasionally using a cane.  She is on 2L 02 at home.  Pt presents with flat affect, no family available to determine baseline cognition.  Pt overall performing at a supervision level.  Will follow to further assess cognition, support at home and safety during ADL.     Follow Up Recommendations  No OT follow up    Equipment Recommendations  Tub/shower seat    Recommendations for Other Services       Precautions / Restrictions Precautions Precautions: Fall Restrictions Weight Bearing Restrictions: No      Mobility Bed Mobility Overal bed mobility: Modified Independent             General bed mobility comments: HOB up, uses momentum  Transfers Overall transfer level: Needs assistance   Transfers: Sit to/from Stand Sit to Stand: Supervision         General transfer comment: no physical assistance, supervision for safety and attention to 02 line    Balance                                            ADL Overall ADL's : Needs assistance/impaired Eating/Feeding: Independent;Sitting   Grooming: Wash/dry hands;Supervision/safety;Standing   Upper Body Bathing: Set up;Sitting   Lower Body Bathing: Supervison/ safety;Sit to/from stand   Upper Body Dressing : Set up;Sitting   Lower Body Dressing: Supervision/safety;Sit to/from stand Lower Body Dressing Details (indicate cue type and reason): able to don and doff socks independently Toilet Transfer: Supervision/safety;Stand-pivot;BSC   Toileting- Clothing Manipulation and Hygiene:  Supervision/safety;Sit to/from stand               Vision     Perception     Praxis      Pertinent Vitals/Pain Pain Assessment: No/denies pain     Hand Dominance Right   Extremity/Trunk Assessment Upper Extremity Assessment Upper Extremity Assessment: Overall WFL for tasks assessed   Lower Extremity Assessment Lower Extremity Assessment: Defer to PT evaluation   Cervical / Trunk Assessment Cervical / Trunk Assessment: Normal   Communication Communication Communication: No difficulties   Cognition Arousal/Alertness: Awake/alert Behavior During Therapy: Flat affect Overall Cognitive Status: No family/caregiver present to determine baseline cognitive functioning (slow processing)                     General Comments       Exercises       Shoulder Instructions      Home Living Family/patient expects to be discharged to:: Private residence Living Arrangements: Non-relatives/Friends (boyfriend, Tamara Silva) Available Help at Discharge: Friend(s);Family;Available PRN/intermittently Type of Home: House Home Access: Stairs to enter [employer] of Steps: 2 Entrance Stairs-Rails: None Home Layout: One level     Bathroom Shower/Tub: Teacher, early years/pre: Handicapped height     Home Equipment: Cane - single point;Bedside commode;Other (comment) (02)          Prior Functioning/Environment Level of Independence: Independent with assistive device(s)        Comments: sometimes uses a cane  OT Diagnosis: Generalized weakness;Cognitive deficits   OT Problem List: Decreased activity tolerance;Impaired balance (sitting and/or standing);Decreased cognition;Decreased safety awareness;Obesity   OT Treatment/Interventions: Self-care/ADL training;Cognitive remediation/compensation;Patient/family education    OT Goals(Current goals can be found in the care plan section) Acute Rehab OT Goals Patient Stated Goal: return home OT Goal  Formulation: With patient Time For Goal Achievement: 07/03/15 Potential to Achieve Goals: Good ADL Goals Pt Will Perform Grooming: with modified independence;standing Pt Will Perform Lower Body Bathing: with modified independence;sit to/from stand Pt Will Perform Lower Body Dressing: with modified independence;sit to/from stand Pt Will Transfer to Toilet: with modified independence;ambulating;bedside commode (over toilet) Pt Will Perform Toileting - Clothing Manipulation and hygiene: with modified independence;sit to/from stand  OT Frequency: Min 2X/week   Barriers to D/C:            Co-evaluation              End of Session Equipment Utilized During Treatment: Oxygen (2L) Nurse Communication: Mobility status  Activity Tolerance: Patient tolerated treatment well Patient left: in bed;with call bell/phone within reach;with bed alarm set   Time: 1545-1601 OT Time Calculation (min): 16 min Charges:  OT General Charges $OT Visit: 1 Procedure OT Evaluation $OT Eval Moderate Complexity: 1 Procedure G-Codes:    Tamara Silva 06/26/2015, 4:07 PM [phone]

## 2015-06-27 DIAGNOSIS — D509 Iron deficiency anemia, unspecified: Secondary | ICD-10-CM | POA: Diagnosis present

## 2015-06-27 DIAGNOSIS — E662 Morbid (severe) obesity with alveolar hypoventilation: Secondary | ICD-10-CM | POA: Diagnosis present

## 2015-06-27 DIAGNOSIS — I5023 Acute on chronic systolic (congestive) heart failure: Secondary | ICD-10-CM | POA: Diagnosis present

## 2015-06-27 LAB — COMPREHENSIVE METABOLIC PANEL
ALBUMIN: 2.6 g/dL — AB (ref 3.5–5.0)
ALT: 26 U/L (ref 14–54)
ANION GAP: 9 (ref 5–15)
AST: 17 U/L (ref 15–41)
Alkaline Phosphatase: 72 U/L (ref 38–126)
BILIRUBIN TOTAL: 0.4 mg/dL (ref 0.3–1.2)
BUN: 20 mg/dL (ref 6–20)
CO2: 45 mmol/L — AB (ref 22–32)
Calcium: 8.6 mg/dL — ABNORMAL LOW (ref 8.9–10.3)
Chloride: 88 mmol/L — ABNORMAL LOW (ref 101–111)
Creatinine, Ser: 0.95 mg/dL (ref 0.44–1.00)
GFR calc Af Amer: >60 mL/min (ref >60)
GFR calc non Af Amer: >60 mL/min (ref >60)
GLUCOSE: 273 mg/dL — AB (ref 65–99)
POTASSIUM: 4.3 mmol/L (ref 3.5–5.1)
SODIUM: 142 mmol/L (ref 135–145)
TOTAL PROTEIN: 6.9 g/dL (ref 6.5–8.1)

## 2015-06-27 LAB — CBC WITH DIFFERENTIAL/PLATELET
BASOS ABS: 0 10*3/uL (ref 0.0–0.1)
BASOS PCT: 0 %
Eosinophils Absolute: 0 10*3/uL (ref 0.0–0.7)
Eosinophils Relative: 1 %
HEMATOCRIT: 32.2 % — AB (ref 36.0–46.0)
HEMOGLOBIN: 8.8 g/dL — AB (ref 12.0–15.0)
Lymphocytes Relative: 17 %
Lymphs Abs: 1.3 10*3/uL (ref 0.7–4.0)
MCH: 21.1 pg — ABNORMAL LOW (ref 26.0–34.0)
MCHC: 27.3 g/dL — ABNORMAL LOW (ref 30.0–36.0)
MCV: 77.2 fL — ABNORMAL LOW (ref 78.0–100.0)
Monocytes Absolute: 0.7 10*3/uL (ref 0.1–1.0)
Monocytes Relative: 9 %
NEUTROS ABS: 5.5 10*3/uL (ref 1.7–7.7)
NEUTROS PCT: 73 %
Platelets: 286 10*3/uL (ref 150–400)
RBC: 4.17 MIL/uL (ref 3.87–5.11)
RDW: 19.1 % — ABNORMAL HIGH (ref 11.5–15.5)
WBC: 7.4 10*3/uL (ref 4.0–10.5)

## 2015-06-27 LAB — GLUCOSE, CAPILLARY
GLUCOSE-CAPILLARY: 236 mg/dL — AB (ref 65–99)
GLUCOSE-CAPILLARY: 247 mg/dL — AB (ref 65–99)
Glucose-Capillary: 139 mg/dL — ABNORMAL HIGH (ref 65–99)
Glucose-Capillary: 391 mg/dL — ABNORMAL HIGH (ref 65–99)
Glucose-Capillary: 299 mg/dL — ABNORMAL HIGH (ref 65–99)

## 2015-06-27 LAB — MAGNESIUM: MAGNESIUM: 2 mg/dL (ref 1.7–2.4)

## 2015-06-27 MED ORDER — INSULIN DETEMIR 100 UNIT/ML ~~LOC~~ SOLN
46.0000 [IU] | Freq: Every day | SUBCUTANEOUS | Status: DC
  Administered 2015-06-27 – 2015-06-28 (×2): 46 [IU] via SUBCUTANEOUS
  Filled 2015-06-27 (×3): qty 0.46

## 2015-06-27 MED ORDER — ZOLPIDEM TARTRATE 5 MG PO TABS
5.0000 mg | ORAL_TABLET | Freq: Every evening | ORAL | Status: DC | PRN
  Administered 2015-06-27: 5 mg via ORAL
  Filled 2015-06-27: qty 1

## 2015-06-27 NOTE — Progress Notes (Signed)
Inpatient Diabetes Program Recommendations  AACE/ADA: New Consensus Statement on Inpatient Glycemic Control (2015)  Target Ranges:  Prepandial:   less than 140 mg/dL      Peak postprandial:   less than 180 mg/dL (1-2 hours)      Critically ill patients:  140 - 180 mg/dL   Results for Tamara Silva, Tamara Silva (MRN [id number]) as of 06/27/2015 10:53  Ref. Range 06/26/2015 07:36 06/26/2015 11:54 06/26/2015 16:02 06/26/2015 21:26  Glucose-Capillary Latest Ref Range: 65-99 mg/dL 101 (H) 215 (H) 319 (H) 298 (H)    Results for Tamara Silva, Tamara Silva (MRN [id number]) as of 06/27/2015 10:53  Ref. Range 06/27/2015 06:35  Glucose-Capillary Latest Ref Range: 65-99 mg/dL 139 (H)    Home DM meds: Levemir 45 units daily        Novolog 15-20 units TID per SSI  Current Orders: Levemir 42 units QHS      Novolog Resistant SSI (0-20 units) TID AC + HS     -Patient currently getting Prednisone 40 mg daily.  -Having elevated postprandial glucose levels.    -Note Lantus 42 units was held last night- Unsure why??      MD- Please note that Levemir 42 units was held last night.  Per MAR notes, Levemir was held b/c pharmacy never sent the dose to the nursing unit.  Fasting glucose actually decent this AM- 139 mg/dl.  Patient may not need as much Levemir as is currently ordered.    Please consider the following:  1. Reduce Levemir to 35 units QHS (this would be 80% of home dose)  2. Start Novolog Meal Coverage- Novolog 6 units tidwc      --Will follow patient during hospitalization--  Wyn Quaker RN, MSN, CDE Diabetes Coordinator Inpatient Glycemic Control Team Team Pager: [phone] (8a-5p)

## 2015-06-27 NOTE — Hospital Discharge Follow-Up (Signed)
Transitional Care Clinic Care Coordination Note:  Admit date:  06/20/15 Discharge date: TBD Discharge Disposition: Home when stable Patient contact: [phone] Emergency contact(s): [name] (significant [phone]  This Case Manager reviewed patient's EMR and determined patient would benefit from post-discharge medical management and chronic care management services through the [facility]. Patient has a history of uncontrolled type 2 diabetes mellitus, COPD, CHF, HTN, CAD. Patient admitted with severe hypoglycemia. This Case Manager met with patient to discuss the services and medical management that can be provided at the [facility]. Patient verbalized understanding and agreed to receive post-discharge care at the [facility].   Patient scheduled for Transitional Care appointment on: TBD. Patient did not want to schedule Transitional Care appointment until discharge date known. Transitional Care Case Manager will follow-up to schedule appointment at a later time. Clinic information and appointment time provided to patient. Appointment information also placed on AVS.  Assessment:       Home Environment: Patient lives with her significant other, [name], in a private residence.       Support System: daughter, significant other       Level of functioning: Independent       Home DME: home nebulizer and home oxygen. Patient indicated she uses her oxygen PRN (2L per nasal cannula). [employer] is her oxygen supplier.       Home care services: none       Transportation: Patient indicated she uses Medicaid transportation to get to her medical appointments. No transportation barriers identified.        Food/Nutrition: Patient indicated she has access to needed food.        Medications: Patient indicated she gets her medications from [place]. Medications delivered to her home. She denied any problems obtaining or affording her medications.          Identified Barriers: continued diabetes education needed        PCP: Dr. Genevive [facility] and [place] services:        Services communicated to Olga Coaster, RN CM

## 2015-06-27 NOTE — Progress Notes (Signed)
[place] TEAM 1 - Stepdown/ICU TEAM Progress Note  Tamara Silva [id number] DOB: 1960-09-08 DOA: 06/20/2015 PCP: Angelica Chessman, MD  Admit HPI / Brief Narrative: 55yo BF PMHx  DM Type 2, Systolic and Diastolic HF w/ EF [id number], COPD On home O2 of 2 L, OSA/Obesity Hypoventilation Syndrome, and PE   Who presented to the ED on 1/20 w/ reported unintentional insulin overdose (initial glucose on EMS arrival was 16). In the ED, her blood sugar was 100, and electrolytes were normal. She was groggy and lethargic and complained of some dyspnea. During her workup, she was noted to have an elevated BNP, minimally elevated TNI, and pulmonary edema on chest x-ray.   Shortly after admit she developed increasing anxiety, which progressed to worsening confusion going into 1/22. It was initially felt to be due to xanax so this was held. Over the course of the day her work of breathing seemed to worsen. Ammonia was noted to be high so this was treated w/ lactulose. Her work of breathing continued to worsen and an ABG noted severe respiratory acidosis. She was placed on NIPPV and PCCM was asked to assess and assist w/ care.   HPI/Subjective: 1/27 A/O 4, states recalls passing out was found down by her boyfriend. On home O2 of 2 L at night and sleeping occasionally during the day. Only complaint is the fit of the CPAP mask. Uncomfortable    Assessment/Plan:  Acute encephalopathy - multifactorial -resolved  -Ammonia WNL  -B 12 elevated/folate WNL -RPR negative -TSH WNL  Hyperammoniemia -Etiology?  -Resolved   DM II uncontrolled w/ severe Hypoglycemia -12/21 hemoglobin A1c= 7.9 -Increase Lantus to 46 units QHS -Continue resistant SSI  Acute on chronic hypercarbic respiratory failure/ COPD w acute exacerbation -Wean O2 as able ( On home O2 of 2 L)  ?CAP -Complete 7 day course of antibiotics -Albuterol PRN -DuoNeb TID -Prednisone 40 mg daily   Acute on Chronic systolic CHF - Acute  pulmonary edema -TTE 07/2014 EF 40-45%  -Strict In and Out; Since admission;-12.2L  -Daily weight admit weight =134kg         1/27 weight= 127 kg  -Continue Lasix 80 mg BID  Mild OSA/obesity hypoventilation syndrome, not on home CPAP -01/13/15 PSG with mild OSA and documented desats to 82% - not on home CPAP - PCCM has been using nocturnal CPAP, auto-set device - per PCCM "Suspect she would benefit from this at home. She should qualify based on sx and her PSG from 01/13/15. Would try to arrange for discharge if she is willing." -PRN Ambien to make patient more comfortable on CPAP  HTN -BP currently well controlled   CAD native artery Cont ASA 81 mg daily -Effient 10 mg daily   Hyperkalemia -Resolved   GERD on chronic PPI  Chronic microcytic anemia -Check anemia panel  Yeast vaginitis -Complete 3 days of diflucan   Morbid obesity - Body mass index is 44.64 kg/(m^2). -PT/OT recommends no follow-up    Code Status: FULL Family Communication: no family present at time of exam Disposition Plan: After fully diuresed    Consultants: Salley Scarlet [facility] M   Procedure/Significant Events: 1/21 Admit with hypoglycemia, with subsequent pulmonary edema on CXR. Hypercap resp failure 1/23 RUQ [place] > GB wall thickening and edema without stones 1/25 TRH assumed care    Culture   Antibiotics: Azithromycin 1/22>> Ceftriaxone 1/22>>   DVT prophylaxis: Lovenox   Devices Negative   LINES / TUBES:  NA    Continuous Infusions: .  sodium chloride      Objective: VITAL SIGNS: Temp: 97.9 F (36.6 C) (01/27 1200) Temp Source: Oral (01/27 1200) BP: 114/48 mmHg (01/27 1200) Pulse Rate: 72 (01/27 1200) SPO2; FIO2:   Intake/Output Summary (Last 24 hours) at 06/27/15 1932 Last data filed at 06/27/15 1836  Gross per 24 hour  Intake   1530 ml  Output   4050 ml  Net  -2520 ml     Exam: General:A/O 4, sitting comfortably in bed, No acute respiratory  distress Eyes: Negative headache, negative scleral hemorrhage ENT: Negative Runny nose, negative gingival bleeding, Neck:  Negative scars, masses, torticollis, lymphadenopathy, JVD Lungs: diffuse poor air movement, mild diffuse expiratory wheezing, negative  crackles Cardiovascular: Regular rate and rhythm without murmur gallop or rub normal S1 and S2 Abdomen: Morbidly obese, negative abdominal pain, nondistended, positive soft, bowel sounds, no rebound, no ascites, no appreciable mass Extremities: No significant cyanosis, clubbing. Positive bilateral lower extremity edema (improved from 1/26) Psychiatric:  Negative depression, negative anxiety, negative fatigue, negative mania  Neurologic:  Cranial nerves II through XII intact, tongue/uvula midline, all extremities muscle strength 5/5, sensation intact throughout, negative dysarthria, negative expressive aphasia, negative receptive aphasia.   Data Reviewed: Basic Metabolic Panel:  Recent Labs Lab 06/22/15 1738 06/23/15 0245 06/23/15 0453 06/24/15 0350 06/26/15 0405 06/27/15 1130  NA 138  139 138  --  135 144 142  K 5.0  5.0 5.5*  --  5.2* 4.7 4.3  CL 94*  94* 94*  --  92* 90* 88*  CO2 38*  39* 37*  --  39* 43* 45*  GLUCOSE 233*  229* 216*  --  378* 143* 273*  BUN 14  15 19   --  32* 25* 20  CREATININE 1.00  0.95 0.97  --  1.07* 0.85 0.95  CALCIUM 8.5*  8.5* 8.5*  --  8.3* 8.7* 8.6*  MG 1.9  --  1.9  --   --  2.0  PHOS 3.4  --   --   --   --   --    Liver Function Tests:  Recent Labs Lab 06/22/15 1140 06/22/15 1738 06/23/15 0245 06/26/15 0405 06/27/15 1130  AST 14* 12* 15 13* 17  ALT 15 16 16 18 26   ALKPHOS 58 57 49 57 72  BILITOT 0.3 0.5 0.5 0.5 0.4  PROT 6.8 7.2 7.0 6.6 6.9  ALBUMIN 2.6* 2.7*  2.7* 2.5* 2.5* 2.6*   No results for input(s): LIPASE, AMYLASE in the last 168 hours.  Recent Labs Lab 06/22/15 1140 06/24/15 0350 06/26/15 0405  AMMONIA 72* 39* 32   CBC:  Recent Labs Lab 06/22/15 1738  06/23/15 0245 06/24/15 0350 06/26/15 0405 06/27/15 1130  WBC 5.7 5.1 6.8 9.3 7.4  NEUTROABS  --   --   --   --  5.5  HGB 8.3* 8.1* 7.7* 8.5* 8.8*  HCT 31.0* 29.4* 28.0* 30.3* 32.2*  MCV 79.7 78.4 75.5* 77.3* 77.2*  PLT 200 197 218 248 286   Cardiac Enzymes:  Recent Labs Lab 06/20/15 2336  TROPONINI 0.05*   BNP (last 3 results)  Recent Labs  06/20/15 2337 06/24/15 0350  BNP 293.5* 1007.4*    ProBNP (last 3 results) No results for input(s): PROBNP in the last 8760 hours.  CBG:  Recent Labs Lab 06/26/15 1602 06/26/15 2126 06/27/15 0635 06/27/15 1120 06/27/15 1636  GLUCAP 319* 298* 139* 247* 391*    Recent Results (from the past 240 hour(s))  Culture, Urine  Status: None   Collection Time: 06/21/15  2:34 PM  Result Value Ref Range Status   Specimen Description URINE, CATHETERIZED  Final   Special Requests Normal  Final   Culture MULTIPLE SPECIES PRESENT, SUGGEST RECOLLECTION  Final   Report Status 06/23/2015 FINAL  Final  Culture, blood (Routine X 2) w Reflex to ID Panel     Status: None   Collection Time: 06/21/15  3:04 PM  Result Value Ref Range Status   Specimen Description BLOOD BLOOD LEFT ARM  Final   Special Requests BOTTLES DRAWN AEROBIC AND ANAEROBIC 10CC  Final   Culture NO GROWTH 5 DAYS  Final   Report Status 06/26/2015 FINAL  Final  Culture, blood (Routine X 2) w Reflex to ID Panel     Status: None   Collection Time: 06/21/15  3:16 PM  Result Value Ref Range Status   Specimen Description BLOOD BLOOD RIGHT HAND  Final   Special Requests BOTTLES DRAWN AEROBIC AND ANAEROBIC 4CC  Final   Culture NO GROWTH 5 DAYS  Final   Report Status 06/26/2015 FINAL  Final  Respiratory virus panel     Status: None   Collection Time: 06/22/15  4:41 PM  Result Value Ref Range Status   Respiratory Syncytial Virus A Negative Negative Final   Respiratory Syncytial Virus B Negative Negative Final   Influenza A Negative Negative Final   Influenza B Negative  Negative Final   Parainfluenza 1 Negative Negative Final   Parainfluenza 2 Negative Negative Final   Parainfluenza 3 Negative Negative Final   Metapneumovirus Negative Negative Final   Rhinovirus Negative Negative Final   Adenovirus Negative Negative Final    Comment: (NOTE) Performed At: [facility] [place], [place] [id number] Lindon Romp MD [id number]   MRSA PCR Screening     Status: None   Collection Time: 06/22/15  4:42 PM  Result Value Ref Range Status   MRSA by PCR NEGATIVE NEGATIVE Final    Comment:        The GeneXpert MRSA Assay (FDA approved for NASAL specimens only), is one component of a comprehensive MRSA colonization surveillance program. It is not intended to diagnose MRSA infection nor to guide or monitor treatment for MRSA infections.      Studies:  Recent x-ray studies have been reviewed in detail by the Attending Physician  Scheduled Meds:  Scheduled Meds: . antiseptic oral rinse  7 mL Mouth Rinse BID  . aspirin EC  81 mg Oral Daily  . azithromycin  500 mg Oral Daily  . carvedilol  12.5 mg Oral BID WC  . enoxaparin (LOVENOX) injection  60 mg Subcutaneous Daily  . furosemide  80 mg Intravenous Q12H  . insulin aspart  0-20 Units Subcutaneous TID WC  . insulin aspart  0-5 Units Subcutaneous QHS  . insulin detemir  46 Units Subcutaneous QHS  . ipratropium-albuterol  3 mL Nebulization TID  . pantoprazole  40 mg Oral Daily  . prasugrel  10 mg Oral Daily  . pravastatin  80 mg Oral QPM  . predniSONE  40 mg Oral Q breakfast    Time spent on care of this patient: 40 mins   Zain Lankford, Geraldo Docker , MD  [facility] Office  [phone] Pager - [phone]  On-Call/Text Page:      [email or web address]      password [email or web address]  If 7PM-7AM, please contact night-coverage [email or web address] Password [email or web address] 06/27/2015, 7:32 PM   LOS: 6 days   Care during  the described time interval was provided by me .  I have reviewed this patient's  available data, including medical history, events of note, physical examination, and all test results as part of my evaluation. I have personally reviewed and interpreted all radiology studies.   Dia Crawford, MD [phone] Pager

## 2015-06-28 ENCOUNTER — Inpatient Hospital Stay (HOSPITAL_COMMUNITY): Payer: Medicaid Other

## 2015-06-28 DIAGNOSIS — I5043 Acute on chronic combined systolic (congestive) and diastolic (congestive) heart failure: Principal | ICD-10-CM

## 2015-06-28 DIAGNOSIS — E662 Morbid (severe) obesity with alveolar hypoventilation: Secondary | ICD-10-CM

## 2015-06-28 DIAGNOSIS — G4733 Obstructive sleep apnea (adult) (pediatric): Secondary | ICD-10-CM

## 2015-06-28 LAB — GLUCOSE, CAPILLARY
GLUCOSE-CAPILLARY: 289 mg/dL — AB (ref 65–99)
GLUCOSE-CAPILLARY: 108 mg/dL — AB (ref 65–99)
Glucose-Capillary: 139 mg/dL — ABNORMAL HIGH (ref 65–99)
Glucose-Capillary: 217 mg/dL — ABNORMAL HIGH (ref 65–99)
Glucose-Capillary: 314 mg/dL — ABNORMAL HIGH (ref 65–99)

## 2015-06-28 LAB — BASIC METABOLIC PANEL
Anion gap: 7 (ref 5–15)
BUN: 21 mg/dL — ABNORMAL HIGH (ref 6–20)
CHLORIDE: 86 mmol/L — AB (ref 101–111)
CO2: 44 mmol/L — ABNORMAL HIGH (ref 22–32)
Calcium: 8.8 mg/dL — ABNORMAL LOW (ref 8.9–10.3)
Creatinine, Ser: 0.97 mg/dL (ref 0.44–1.00)
GFR calc Af Amer: >60 mL/min (ref >60)
GFR calc non Af Amer: >60 mL/min (ref >60)
Glucose, Bld: 354 mg/dL — ABNORMAL HIGH (ref 65–99)
POTASSIUM: 5 mmol/L (ref 3.5–5.1)
Sodium: 137 mmol/L (ref 135–145)

## 2015-06-28 MED ORDER — PREDNISONE 20 MG PO TABS
20.0000 mg | ORAL_TABLET | Freq: Every day | ORAL | Status: DC
  Administered 2015-06-28: 20 mg via ORAL

## 2015-06-28 MED ORDER — SODIUM CHLORIDE 0.9% FLUSH: 10.0000 mL | INTRAVENOUS | Status: DC | PRN

## 2015-06-28 MED ORDER — SENNA 8.6 MG PO TABS
2.0000 | ORAL_TABLET | Freq: Once | ORAL | Status: AC
  Administered 2015-06-28: 17.2 mg via ORAL
  Filled 2015-06-28: qty 2

## 2015-06-28 MED ORDER — IOHEXOL 350 MG/ML SOLN
100.0000 mL | Freq: Once | INTRAVENOUS | Status: AC | PRN
  Administered 2015-06-28: 100 mL via INTRAVENOUS

## 2015-06-28 MED ORDER — ENSURE ENLIVE PO LIQD
237.0000 mL | Freq: Two times a day (BID) | ORAL | Status: DC
  Administered 2015-06-29: 237 mL via ORAL

## 2015-06-28 MED ORDER — FUROSEMIDE 10 MG/ML IJ SOLN
80.0000 mg | Freq: Four times a day (QID) | INTRAMUSCULAR | Status: DC
  Administered 2015-06-28 – 2015-06-29 (×4): 80 mg via INTRAVENOUS
  Filled 2015-06-28 (×4): qty 8

## 2015-06-28 NOTE — Progress Notes (Signed)
[place] TEAM 1 - Stepdown/ICU TEAM Progress Note  Tamara Silva [id number] DOB: Jan 28, 1961 DOA: 06/20/2015 PCP: Angelica Chessman, MD  Admit HPI / Brief Narrative: 55yo BF PMHx  DM Type 2, Systolic and Diastolic HF w/ EF [id number], COPD On home O2 of 2 L, OSA/Obesity Hypoventilation Syndrome, and PE   Who presented to the ED on 1/20 w/ reported unintentional insulin overdose (initial glucose on EMS arrival was 16). In the ED, her blood sugar was 100, and electrolytes were normal. She was groggy and lethargic and complained of some dyspnea. During her workup, she was noted to have an elevated BNP, minimally elevated TNI, and pulmonary edema on chest x-ray.   Shortly after admit she developed increasing anxiety, which progressed to worsening confusion going into 1/22. It was initially felt to be due to xanax so this was held. Over the course of the day her work of breathing seemed to worsen. Ammonia was noted to be high so this was treated w/ lactulose. Her work of breathing continued to worsen and an ABG noted severe respiratory acidosis. She was placed on NIPPV and PCCM was asked to assess and assist w/ care.   HPI/Subjective: No complaint of dyspnea. Minimal cough.   Assessment/Plan:  Acute on chronic hypercarbic respiratory failure/ COPD w acute exacerbation - on 3 L O2 at this time-  ambulatory pulse ox reveals a pulse ox of 95% on exertion without O2 but CXR continue to suggest pulmonary edema- will obtain CT chest to ensure no PE -Completed a 7 day course of antibiotics   -wean Prednisone today and stop tomorrow- no h/o COPD -TTE 07/2014 EF 40-45%  - In and Out; Since admission -14 L   -Continue Lasix 80 mg BID  Acute encephalopathy - multifactorial -resolved  -Ammonia WNL  -B 12 elevated/folate WNL -RPR negative -TSH WNL  Hyperammoniemia -Etiology?  -Resolved   DM II uncontrolled w/ severe Hypoglycemia -12/21 hemoglobin A1c= 7.9 -Increased Lantus to 46 units  QHS -Continue resistant SSI  Mild OSA/obesity hypoventilation syndrome, not on home CPAP -01/13/15 PSG with mild OSA and documented desats to 82% - not on home CPAP - PCCM has been using nocturnal CPAP, auto-set device - per PCCM "Suspect she would benefit from this at home. She should qualify based on sx and her PSG from 01/13/15. Would try to arrange for discharge if she is willing." - have ordered CPAP for home  HTN -BP currently well controlled   CAD native artery Cont ASA 81 mg daily -Effient 10 mg daily   Hyperkalemia -Resolved   GERD on chronic PPI  Chronic microcytic anemia - low normal ferretin and Iron saturation   Yeast vaginitis -Complete 3 days of diflucan   Morbid obesity - Body mass index is 44.64 kg/(m^2). -PT/OT recommends no follow-up    Code Status: FULL Family Communication: no family present at time of exam Disposition Plan: After fully diuresed    Consultants: Salley Scarlet [facility] M   Procedure/Significant Events: 1/21 Admit with hypoglycemia, with subsequent pulmonary edema on CXR. Hypercap resp failure 1/23 RUQ [place] > GB wall thickening and edema without stones 1/25 TRH assumed care    Culture   Antibiotics: Azithromycin 1/22>> Ceftriaxone 1/22>>   DVT prophylaxis: Lovenox   Devices Negative   LINES / TUBES:  NA    Continuous Infusions: . sodium chloride      Objective: VITAL SIGNS: Temp: 97.3 F (36.3 C) (01/28 1300) Temp Source: Oral (01/28 1300) BP: 117/71 mmHg (01/28 1300)  Pulse Rate: 66 (01/28 1300) SPO2; FIO2:   Intake/Output Summary (Last 24 hours) at 06/28/15 1359 Last data filed at 06/28/15 1343  Gross per 24 hour  Intake   1650 ml  Output   4251 ml  Net  -2601 ml     Exam: General:A/O 4, sitting comfortably in bed, No acute respiratory distress Eyes: Negative headache, negative scleral hemorrhage ENT: Negative Runny nose, negative gingival bleeding, Neck:  Negative scars, masses,  torticollis, lymphadenopathy, JVD Lungs: diffuse poor air movement but clear to auscultation  Cardiovascular: Regular rate and rhythm without murmur gallop or rub normal S1 and S2 Abdomen: Morbidly obese, negative abdominal pain, nondistended, positive soft, bowel sounds, no rebound, no ascites, no appreciable mass Extremities: No significant cyanosis, clubbing. Positive bilateral lower extremity edema (improved from 1/26)     Data Reviewed: Basic Metabolic Panel:  Recent Labs Lab 06/22/15 1738 06/23/15 0245 06/23/15 0453 06/24/15 0350 06/26/15 0405 06/27/15 1130  NA 138  139 138  --  135 144 142  K 5.0  5.0 5.5*  --  5.2* 4.7 4.3  CL 94*  94* 94*  --  92* 90* 88*  CO2 38*  39* 37*  --  39* 43* 45*  GLUCOSE 233*  229* 216*  --  378* 143* 273*  BUN 14  15 19   --  32* 25* 20  CREATININE 1.00  0.95 0.97  --  1.07* 0.85 0.95  CALCIUM 8.5*  8.5* 8.5*  --  8.3* 8.7* 8.6*  MG 1.9  --  1.9  --   --  2.0  PHOS 3.4  --   --   --   --   --    Liver Function Tests:  Recent Labs Lab 06/22/15 1140 06/22/15 1738 06/23/15 0245 06/26/15 0405 06/27/15 1130  AST 14* 12* 15 13* 17  ALT 15 16 16 18 26   ALKPHOS 58 57 49 57 72  BILITOT 0.3 0.5 0.5 0.5 0.4  PROT 6.8 7.2 7.0 6.6 6.9  ALBUMIN 2.6* 2.7*  2.7* 2.5* 2.5* 2.6*   No results for input(s): LIPASE, AMYLASE in the last 168 hours.  Recent Labs Lab 06/22/15 1140 06/24/15 0350 06/26/15 0405  AMMONIA 72* 39* 32   CBC:  Recent Labs Lab 06/22/15 1738 06/23/15 0245 06/24/15 0350 06/26/15 0405 06/27/15 1130  WBC 5.7 5.1 6.8 9.3 7.4  NEUTROABS  --   --   --   --  5.5  HGB 8.3* 8.1* 7.7* 8.5* 8.8*  HCT 31.0* 29.4* 28.0* 30.3* 32.2*  MCV 79.7 78.4 75.5* 77.3* 77.2*  PLT 200 197 218 248 286   Cardiac Enzymes: No results for input(s): CKTOTAL, CKMB, CKMBINDEX, TROPONINI in the last 168 hours. BNP (last 3 results)  Recent Labs  06/20/15 2337 06/24/15 0350  BNP 293.5* 1007.4*    ProBNP (last 3 results) No  results for input(s): PROBNP in the last 8760 hours.  CBG:  Recent Labs Lab 06/27/15 1636 06/27/15 2127 06/27/15 2334 06/28/15 0614 06/28/15 1223  GLUCAP 391* 299* 236* 139* 217*    Recent Results (from the past 240 hour(s))  Culture, Urine     Status: None   Collection Time: 06/21/15  2:34 PM  Result Value Ref Range Status   Specimen Description URINE, CATHETERIZED  Final   Special Requests Normal  Final   Culture MULTIPLE SPECIES PRESENT, SUGGEST RECOLLECTION  Final   Report Status 06/23/2015 FINAL  Final  Culture, blood (Routine X 2) w Reflex to ID Panel  Status: None   Collection Time: 06/21/15  3:04 PM  Result Value Ref Range Status   Specimen Description BLOOD BLOOD LEFT ARM  Final   Special Requests BOTTLES DRAWN AEROBIC AND ANAEROBIC 10CC  Final   Culture NO GROWTH 5 DAYS  Final   Report Status 06/26/2015 FINAL  Final  Culture, blood (Routine X 2) w Reflex to ID Panel     Status: None   Collection Time: 06/21/15  3:16 PM  Result Value Ref Range Status   Specimen Description BLOOD BLOOD RIGHT HAND  Final   Special Requests BOTTLES DRAWN AEROBIC AND ANAEROBIC 4CC  Final   Culture NO GROWTH 5 DAYS  Final   Report Status 06/26/2015 FINAL  Final  Respiratory virus panel     Status: None   Collection Time: 06/22/15  4:41 PM  Result Value Ref Range Status   Respiratory Syncytial Virus A Negative Negative Final   Respiratory Syncytial Virus B Negative Negative Final   Influenza A Negative Negative Final   Influenza B Negative Negative Final   Parainfluenza 1 Negative Negative Final   Parainfluenza 2 Negative Negative Final   Parainfluenza 3 Negative Negative Final   Metapneumovirus Negative Negative Final   Rhinovirus Negative Negative Final   Adenovirus Negative Negative Final    Comment: (NOTE) Performed At: [facility] [place], [place] [id number] Lindon Romp MD [id number]   MRSA PCR Screening     Status: None   Collection  Time: 06/22/15  4:42 PM  Result Value Ref Range Status   MRSA by PCR NEGATIVE NEGATIVE Final    Comment:        The GeneXpert MRSA Assay (FDA approved for NASAL specimens only), is one component of a comprehensive MRSA colonization surveillance program. It is not intended to diagnose MRSA infection nor to guide or monitor treatment for MRSA infections.      Studies:  Recent x-ray studies have been reviewed in detail by the Attending Physician  Scheduled Meds:  Scheduled Meds: . antiseptic oral rinse  7 mL Mouth Rinse BID  . aspirin EC  81 mg Oral Daily  . carvedilol  12.5 mg Oral BID WC  . enoxaparin (LOVENOX) injection  60 mg Subcutaneous Daily  . furosemide  80 mg Intravenous Q6H  . insulin aspart  0-20 Units Subcutaneous TID WC  . insulin aspart  0-5 Units Subcutaneous QHS  . insulin detemir  46 Units Subcutaneous QHS  . ipratropium-albuterol  3 mL Nebulization TID  . pantoprazole  40 mg Oral Daily  . prasugrel  10 mg Oral Daily  . pravastatin  80 mg Oral QPM  . predniSONE  20 mg Oral Q breakfast    Time spent on care of this patient: 30 mins   [facility] , MD  [facility] Office  [phone] Pager - [phone]  On-Call/Text Page:      [email or web address]      password [email or web address]  If 7PM-7AM, please contact night-coverage [email or web address] Password [email or web address] 06/28/2015, 1:59 PM   LOS: 7 days

## 2015-06-29 DIAGNOSIS — D509 Iron deficiency anemia, unspecified: Secondary | ICD-10-CM

## 2015-06-29 DIAGNOSIS — J441 Chronic obstructive pulmonary disease with (acute) exacerbation: Secondary | ICD-10-CM

## 2015-06-29 DIAGNOSIS — N182 Chronic kidney disease, stage 2 (mild): Secondary | ICD-10-CM

## 2015-06-29 DIAGNOSIS — I10 Essential (primary) hypertension: Secondary | ICD-10-CM

## 2015-06-29 LAB — BASIC METABOLIC PANEL
Anion gap: 5 (ref 5–15)
BUN: 18 mg/dL (ref 6–20)
CHLORIDE: 85 mmol/L — AB (ref 101–111)
CO2: 50 mmol/L — ABNORMAL HIGH (ref 22–32)
Calcium: 8.9 mg/dL (ref 8.9–10.3)
Creatinine, Ser: 0.96 mg/dL (ref 0.44–1.00)
GFR calc Af Amer: >60 mL/min (ref >60)
Glucose, Bld: 110 mg/dL — ABNORMAL HIGH (ref 65–99)
Potassium: 3.5 mmol/L (ref 3.5–5.1)
Sodium: 140 mmol/L (ref 135–145)

## 2015-06-29 LAB — BLOOD GAS, ARTERIAL
Acid-Base Excess: 24 mmol/L — ABNORMAL HIGH (ref 0.0–2.0)
Bicarbonate: 49.8 mEq/L — ABNORMAL HIGH (ref 20.0–24.0)
Drawn by: 24610
FIO2: 0.21
O2 Saturation: 76 %
PCO2 ART: 66.5 mmHg — AB (ref 35.0–45.0)
PH ART: 7.487 — AB (ref 7.350–7.450)
Patient temperature: 98.6
TCO2: 51.9 mmol/L (ref 0–100)
pO2, Arterial: 43.1 mmHg — ABNORMAL LOW (ref 80.0–100.0)

## 2015-06-29 LAB — GLUCOSE, CAPILLARY
GLUCOSE-CAPILLARY: 38 mg/dL — AB (ref 65–99)
Glucose-Capillary: 90 mg/dL (ref 65–99)
Glucose-Capillary: 172 mg/dL — ABNORMAL HIGH (ref 65–99)

## 2015-06-29 LAB — BRAIN NATRIURETIC PEPTIDE: B Natriuretic Peptide: 489 pg/mL — ABNORMAL HIGH (ref 0.0–100.0)

## 2015-06-29 MED ORDER — INSULIN ASPART 100 UNIT/ML ~~LOC~~ SOLN
0.0000 [IU] | Freq: Three times a day (TID) | SUBCUTANEOUS | Status: DC
  Administered 2015-06-29: 3 [IU] via SUBCUTANEOUS

## 2015-06-29 MED ORDER — FUROSEMIDE 40 MG PO TABS: 80.0000 mg | ORAL_TABLET | Freq: Two times a day (BID) | ORAL | Status: DC

## 2015-06-29 NOTE — Progress Notes (Signed)
CRITICAL VALUE ALERT  Critical value received:  Co2  Date of notification:  06/29/2015  Time of notification:  09:48  Critical value read back:Yes.    Nurse who received alert: Rafael Bihari  MD notified (1st page):  [phone]  Time of first page:  0950  MD notified (2nd page):  Time of second page:  Responding MD:  Dr. Wynelle Cleveland  Time MD responded:  [phone]

## 2015-06-29 NOTE — Discharge Summary (Signed)
Physician Discharge Summary  Tamara Silva [id number] DOB: [birth year] DOA: 06/20/2015  PCP: Angelica Chessman, MD  Admit date: 06/20/2015 Discharge date: 06/29/2015  Time spent: 60 minutes  Recommendations for Outpatient Follow-up:  1. Need CPAP at home and needs to follow up with pulmonary for this 2. Need close follow up by PCP to prevent re-hospitalization  Discharge Condition: stable    Discharge Diagnoses:  Principal Problem:   Hypoglycemia Active Problems:   Acute on chronic respiratory failure with hypercapnia (HCC)   COPD exacerbation (HCC)   Acute on chronic combined systolic (congestive) and diastolic (congestive) heart failure (HCC)   OSA (obstructive sleep apnea)   Obesity hypoventilation syndrome (HCC)   Bipolar disorder ([place])   Essential hypertension   Morbid obesity due to excess calories (HCC)   CAD S/P percutaneous coronary angioplasty - prior PCI to LAD; RCA PCI: new Xience Alpine DES 2.75 mm x 15 mm    CKD (chronic kidney disease), stage II   Anemia of chronic kidney failure   Uncontrolled type 2 diabetes mellitus without complication, with long-term current use of insulin (HCC)   Acute on chronic respiratory failure (HCC)   Acute encephalopathy   Uncontrolled type 2 diabetes mellitus with hyperosmolarity without coma (HCC)   CAP (community acquired pneumonia)   Microcytic anemia   History of present illness:  55yo BF PMHx DM Type 2, Systolic and Diastolic HF w/ EF [id number], COPD On home O2 of 2 L, OSA/Obesity Hypoventilation Syndrome, and PE   Who presented to the ED on 1/20 w/ reported unintentional insulin overdose (initial glucose on EMS arrival was 16). In the ED, her blood sugar was 100, and electrolytes were normal. She was groggy and lethargic and complained of some dyspnea. During her workup, she was noted to have an elevated BNP, minimally elevated TNI, and pulmonary edema on chest x-ray.   Shortly after admit she developed increasing anxiety,  which progressed to worsening confusion going into 1/22. It was initially felt to be due to xanax so this was held. Over the course of the day her work of breathing seemed to worsen. Ammonia was noted to be high so this was treated w/ lactulose. Her work of breathing continued to worsen and an ABG noted severe respiratory acidosis. She was placed on NIPPV and PCCM was asked to assess and assist w/ care.   Hospital Course:  Acute on chronic hypercarbic respiratory failure/ COPD w acute exacerbation/ acute CHF excerbation - on 1-2 L O2 at this time - ambulatory pulse ox reveals a pulse ox of 95% on exertion on 1 L of O2 - lungs clear on exam-  -weaned off of Prednisone  -TTE 07/2014 EF 40-45%  - In and Out; Since admission -20 L and weight improved from 134 to 121 kg -Continue Lasix 80 mg BID as outpt- increased from 80 in AM and 40 in PM - advised to check daily weight and call PCP if weight increasing - needs close follow up - I have recommended she see her PCP in 1 wk to ensure she is not putting on fluid weight  Mild OSA/obesity hypoventilation syndrome, not on home CPAP -01/13/15 PSG with mild OSA and documented desats to 82% - not on home CPAP - PCCM has been using nocturnal CPAP, auto-set device - per PCCM "Suspect she would benefit from this at home. She should qualify based on sx and her PSG from 01/13/15. Would try to arrange for discharge if she is willing." - have ordered CPAP  for home but she has no insurance and no one to even cover a temporary CPAP for her - she states he has had a sleep study in th epast and was advised to have a CPaP but she never made a follow up appt and never did receive a CPCP- advised strictly that she needs to return to the pulmonologist to have a CPAP ordered  Acute encephalopathy - multifactorial -resolved  -Ammonia WNL  -B 12 elevated/folate WNL -RPR negative -TSH WNL  Hyperammoniemia -Etiology? hepatic congestion -Resolved   DM II  uncontrolled w/ severe Hypoglycemia -12/21 hemoglobin A1c= 7.9 -Continue home doses of insulin  HTN -BP currently well controlled   CAD native artery Cont ASA 81 mg daily -Effient 10 mg daily   Hyperkalemia -Resolved   GERD on chronic PPI  Chronic microcytic anemia - low normal ferretin and Iron saturation   Yeast vaginitis -Complete 3 days of diflucan   Morbid obesity - Body mass index is 44.64 kg/(m^2). -PT/OT recommends no follow-up   Consultations:  PCCM  Discharge Exam: Filed Weights   06/27/15 0500 06/28/15 0527 06/29/15 0444  Weight: 127.053 kg (280 lb 1.6 oz) 125.601 kg (276 lb 14.4 oz) 121.292 kg (267 lb 6.4 oz)   Filed Vitals:   06/29/15 0854 06/29/15 1128  BP:  105/57  Pulse: 77 64  Temp:  97.7 F (36.5 C)  Resp: 18 18    General: AAO x 3, no distress Cardiovascular: RRR, no murmurs  Respiratory: clear to auscultation bilaterally GI: soft, non-tender, non-distended, bowel sound positive  Discharge Instructions You were cared for by a hospitalist during your hospital stay. If you have any questions about your discharge medications or the care you received while you were in the hospital after you are discharged, you can call the unit and asked to speak with the hospitalist on call if the hospitalist that took care of you is not available. Once you are discharged, your primary care physician will handle any further medical issues. Please note that NO REFILLS for any discharge medications will be authorized once you are discharged, as it is imperative that you return to your primary care physician (or establish a relationship with a primary care physician if you do not have one) for your aftercare needs so that they can reassess your need for medications and monitor your lab values.      Discharge Instructions    (HEART FAILURE PATIENTS) Call MD:  Anytime you have any of the following symptoms: 1) 3 pound weight gain in 24 hours or 5 pounds in 1 week 2)  shortness of breath, with or without a dry hacking cough 3) swelling in the hands, feet or stomach 4) if you have to sleep on extra pillows at night in order to breathe.    Complete by:  As directed      Discharge instructions    Complete by:  As directed   You need to take a low salt, low fat, low carb diet You must weigh yourself daily and follow instructions given for weight gain You need to be using your CPAP every night, all night and every time you choose to sleep during the day You need to see your family doctor in 1 wk.     Increase activity slowly    Complete by:  As directed             Medication List    TAKE these medications        ACCU-CHEK  AVIVA PLUS test strip  Generic drug:  glucose blood  USE 1 STRIP TO CHECK GLUCOSE 3 TIMES DAILY     ACCU-CHEK SOFTCLIX LANCETS lancets  USE 1 LANCET TO CHECK GLUCOSE 3 TIMES DAILY     aspirin 81 MG tablet  Take 1 tablet (81 mg total) by mouth daily.     carvedilol 12.5 MG tablet  Commonly known as:  COREG  Take 1 tablet (12.5 mg total) by mouth 2 (two) times daily with a meal.     cycloSPORINE 0.05 % ophthalmic emulsion  Commonly known as:  RESTASIS  Place 2 drops into both eyes 2 (two) times daily as needed (for dry eyes).     EASY TOUCH PEN NEEDLES 31G X 8 MM Misc  Generic drug:  Insulin Pen Needle  USE AS DIRECTED TO INJECT INSULIN     EFFIENT 10 MG Tabs tablet  Generic drug:  prasugrel  TAKE ONE TABLET BY MOUTH DAILY.     fluticasone 50 MCG/ACT nasal spray  Commonly known as:  FLONASE  USE 2 SPRAYS IN EACH NOSTRIL TWICE DAILY     furosemide 40 MG tablet  Commonly known as:  LASIX  Take 2 tablets (80 mg total) by mouth 2 (two) times daily.     insulin aspart 100 UNIT/ML injection  Commonly known as:  novoLOG  Inject 15-20 Units into the skin 3 (three) times daily before meals. Sliding scale     insulin detemir 100 UNIT/ML injection  Commonly known as:  LEVEMIR  Inject 0.45 mLs (45 Units total) into the  skin at bedtime.     INSULIN SYRINGE .5CC/29G 29G X 1/2" 0.5 ML Misc  Use as instructed by physician     INVEGA SUSTENNA 117 MG/0.75ML Susp  Generic drug:  Paliperidone Palmitate  Inject 156 mg into the muscle every 3 (three) months.     ipratropium-albuterol 0.5-2.5 (3) MG/3ML Soln  Commonly known as:  DUONEB  Take 3 mLs by nebulization every 6 (six) hours as needed.     nitroGLYCERIN 0.4 MG SL tablet  Commonly known as:  NITROSTAT  Place 1 tablet (0.4 mg total) under the tongue every 5 (five) minutes as needed for chest pain. MUST MAKE APPOINTMENT FOR FUTURE REFILLS     omeprazole 40 MG capsule  Commonly known as:  PRILOSEC  TAKE 1 CAPSULE BY MOUTH TWICE DAILY     ondansetron 8 MG disintegrating tablet  Commonly known as:  ZOFRAN ODT  Take 1 tablet (8 mg total) by mouth every 8 (eight) hours as needed for nausea or vomiting.     pravastatin 80 MG tablet  Commonly known as:  PRAVACHOL  Take 1 tablet (80 mg total) by mouth every evening.     prazosin 5 MG capsule  Commonly known as:  MINIPRESS  TAKE 1 CAPSULE BY MOUTH EVERY NIGHT AT BEDTIME FOR NIGHTMARES     PROAIR HFA 108 (90 Base) MCG/ACT inhaler  Generic drug:  albuterol  INHALE 2 PUFFS BY MOUTH 4 TIMES DAILY AS NEEDED     promethazine 25 MG tablet  Commonly known as:  PHENERGAN  Take 1 tablet (25 mg total) by mouth every 6 (six) hours as needed for nausea or vomiting.     spironolactone 25 MG tablet  Commonly known as:  ALDACTONE  Take 1 tablet (25 mg total) by mouth daily.       Allergies  Allergen Reactions  . Metolazone Other (See Comments)    Dizziness and falling  .  Plavix [Clopidogrel Bisulfate] Other (See Comments)    High PRU's - non-responder  . Nsaids Other (See Comments)    "I do not take NSAIDs d/t interfering with other meds"   Follow-up Information    Follow up with Angelica Chessman, MD.   Specialty:  Internal Medicine   Contact information:   [address] [place]  [id number] [phone]        The results of significant diagnostics from this hospitalization (including imaging, microbiology, ancillary and laboratory) are listed below for reference.    Significant Diagnostic Studies: Dg Chest 2 View  06/21/2015  CLINICAL DATA:  Pneumonia, shortness of breath, fever. EXAM: CHEST  2 VIEW COMPARISON:  Chest x-ray from earlier same day and chest x-ray dated 05/22/2015. FINDINGS: Cardiomegaly appears stable. Irregular opacity within the left upper lobe is stable and presumably chronic scarring. Coarsened interstitial markings are again seen bilaterally, also presumably chronic. There are no new airspace opacities to suggest a developing pneumonia. No pleural effusions seen. No pneumothorax seen. Osseous and soft tissue structures about the chest are unremarkable. IMPRESSION: 1. Cardiomegaly, stable. No evidence of an acute volume overload/CHF. Mildly prominent interstitial markings appear stable compared to multiple prior studies suggesting a chronic mild CHF. 2. Probable chronic scarring/fibrosis within the left upper lobe, stable. 3. No evidence of pneumonia seen. Electronically Signed   By: Franki Cabot M.D.   On: 06/21/2015 15:55   Dg Chest 2 View  06/21/2015  CLINICAL DATA:  Patient found unresponsive. Wheezing. Initial encounter. EXAM: CHEST  2 VIEW COMPARISON:  Chest radiograph performed 05/22/2015 FINDINGS: The lungs are well-aerated. Vascular congestion is noted. Increased interstitial markings raise concern for mild interstitial edema. Bilateral scarring is noted. No pleural effusion or pneumothorax is identified. The heart is mildly enlarged. No acute osseous abnormalities are seen. IMPRESSION: Vascular congestion and mild cardiomegaly. Increased interstitial markings raise concern for mild interstitial edema. Bilateral scarring noted. Electronically Signed   By: Garald Balding M.D.   On: 06/21/2015 01:08   Ct Angio Chest Pe W/cm &/or Wo Cm  06/28/2015   CLINICAL DATA:  Patient with shortness of breath for 1 week. History of PE. EXAM: CT ANGIOGRAPHY CHEST WITH CONTRAST TECHNIQUE: Multidetector CT imaging of the chest was performed using the standard protocol during bolus administration of intravenous contrast. Multiplanar CT image reconstructions and MIPs were obtained to evaluate the vascular anatomy. CONTRAST:  144mL OMNIPAQUE IOHEXOL 350 MG/ML SOLN COMPARISON:  CT PE study 02/01/2014; 03/04/2014. FINDINGS: Mediastinum/Nodes: Interval increase in bulky mediastinal and hilar lymphadenopathy with a reference right paratracheal lymph node measuring 1.8 cm (image 32; series 51), previously 1.3 cm. Reference left hilar lymph node measures 1.4 cm (image 55; series 501), previously 0.6 cm. Heart is enlarged. No pericardial effusion. Aorta main pulmonary artery are normal in caliber. Adequate opacification of the pulmonary arterial system. 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 No evidence for acute pulmonary embolism. Lungs/Pleura: Images acquired in expiratory phase. Respiratory motion limits evaluation. Calcified pleural plaques within the left hemi thorax with associated scarring in the left upper and left lower lobes is grossly stable. No pleural effusion or pneumothorax. Upper abdomen: Unremarkable Musculoskeletal: No aggressive or acute appearing osseous lesions. Review of the MIP images confirms the above findings. IMPRESSION: Interval enlargement of now bulky mediastinal and hilar lymphadenopathy which are nonspecific and may be secondary to an infectious or inflammatory process. Malignant etiologies such as metastatic disease or  lymphoma are not excluded. No evidence for acute pulmonary embolism. 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 Electronically Signed   By: Lovey Newcomer M.D.   On:  06/28/2015 17:15   Dg Chest Port 1 View  06/28/2015  CLINICAL DATA:  55 year old female with hypoxia. EXAM: PORTABLE CHEST 1 VIEW COMPARISON:  06/24/2015 and prior exams FINDINGS: Cardiomegaly, pulmonary vascular congestion and mid -upper left lung scarring again noted. A right PICC line is again identified with tip overlying the lower SVC. There is no evidence of pneumothorax. There has been little interval change since the prior study. IMPRESSION: Unchanged appearance of the chest with cardiomegaly and pulmonary vascular congestion. Electronically Signed   By: Margarette Canada M.D.   On: 06/28/2015 08:49   Dg Chest Port 1 View  06/24/2015  CLINICAL DATA:  Asthma.  Central catheter placement EXAM: PORTABLE CHEST 1 VIEW COMPARISON:  June 22, 2015 FINDINGS: Central catheter tip is in the superior vena cava. No pneumothorax. There is scarring in the left mid lung peripheral region, stable. There is no frank edema or consolidation. There is cardiomegaly with mild pulmonary venous hypertension, stable. No adenopathy. IMPRESSION: Evidence a degree of pulmonary vascular congestion, stable. Scarring on the left. No new opacity. Central catheter tip in superior vena cava. No pneumothorax. Electronically Signed   By: Lowella Grip III M.D.   On: 06/24/2015 07:40   Dg Chest Port 1 View  06/22/2015  CLINICAL DATA:  Altered mental status. Acute encephalopathy. COPD, hypertension, coronary artery disease and diabetes. EXAM: PORTABLE CHEST 1 VIEW COMPARISON:  06/21/2015 and chest CT on 10 515 FINDINGS: Cardiomegaly remains stable. No evidence congestive heart failure. Bilateral pleural-parenchymal scarring appears stable. No evidence of pulmonary consolidation or pleural effusion. No pneumothorax visualized. IMPRESSION: Stable cardiomegaly and bilateral pleural- parenchymal scarring. No acute findings. Electronically Signed   By: Earle Gell M.D.   On: 06/22/2015 16:22   US Abdomen Limited Ruq  06/23/2015  CLINICAL  DATA:  Acute respiratory failure. Elevated ammonia levels, cirrhosis. EXAM: US ABDOMEN LIMITED - RIGHT UPPER QUADRANT COMPARISON:  None. FINDINGS: Gallbladder: The gallbladder is partially decompressed. No evidence of cholelithiasis. The gallbladder wall is thickened and edematous measuring 7 mm. Sonographic Murphy's sign was reported as negative. Common bile duct: Diameter: 3 mm Liver: No focal lesion identified. Within normal limits in parenchymal echogenicity. IMPRESSION: Gallbladder wall thickening and edema, without evidence of cholelithiasis. This may represent acalculous cholecystitis, in the appropriate clinical setting. Electronically Signed   By: Fidela Salisbury M.D.   On: 06/23/2015 12:45    Microbiology: Recent Results (from the past 240 hour(s))  Culture, Urine     Status: None   Collection Time: 06/21/15  2:34 PM  Result Value Ref Range Status   Specimen Description URINE, CATHETERIZED  Final   Special Requests Normal  Final   Culture MULTIPLE SPECIES PRESENT, SUGGEST RECOLLECTION  Final   Report Status 06/23/2015 FINAL  Final  Culture, blood (Routine X 2) w Reflex to ID Panel     Status: None   Collection Time: 06/21/15  3:04 PM  Result Value Ref Range Status   Specimen Description BLOOD BLOOD LEFT ARM  Final   Special Requests BOTTLES DRAWN AEROBIC AND ANAEROBIC 10CC  Final   Culture NO GROWTH 5 DAYS  Final   Report Status 06/26/2015 FINAL  Final  Culture, blood (Routine X 2) w Reflex to ID Panel     Status:  None   Collection Time: 06/21/15  3:16 PM  Result Value Ref Range Status   Specimen Description BLOOD BLOOD RIGHT HAND  Final   Special Requests BOTTLES DRAWN AEROBIC AND ANAEROBIC 4CC  Final   Culture NO GROWTH 5 DAYS  Final   Report Status 06/26/2015 FINAL  Final  Respiratory virus panel     Status: None   Collection Time: 06/22/15  4:41 PM  Result Value Ref Range Status   Respiratory Syncytial Virus A Negative Negative Final   Respiratory Syncytial Virus B  Negative Negative Final   Influenza A Negative Negative Final   Influenza B Negative Negative Final   Parainfluenza 1 Negative Negative Final   Parainfluenza 2 Negative Negative Final   Parainfluenza 3 Negative Negative Final   Metapneumovirus Negative Negative Final   Rhinovirus Negative Negative Final   Adenovirus Negative Negative Final    Comment: (NOTE) Performed At: [facility] [place], [place] [id number] Lindon Romp MD [id number]   MRSA PCR Screening     Status: None   Collection Time: 06/22/15  4:42 PM  Result Value Ref Range Status   MRSA by PCR NEGATIVE NEGATIVE Final    Comment:        The GeneXpert MRSA Assay (FDA approved for NASAL specimens only), is one component of a comprehensive MRSA colonization surveillance program. It is not intended to diagnose MRSA infection nor to guide or monitor treatment for MRSA infections.      Labs: Basic Metabolic Panel:  Recent Labs Lab 06/22/15 1738  06/23/15 0453 06/24/15 0350 06/26/15 0405 06/27/15 1130 06/28/15 1602 06/29/15 0557  NA 138  139  < >  --  135 144 142 137 140  K 5.0  5.0  < >  --  5.2* 4.7 4.3 5.0 3.5  CL 94*  94*  < >  --  92* 90* 88* 86* 85*  CO2 38*  39*  < >  --  39* 43* 45* 44* 50*  GLUCOSE 233*  229*  < >  --  378* 143* 273* 354* 110*  BUN 14  15  < >  --  32* 25* 20 21* 18  CREATININE 1.00  0.95  < >  --  1.07* 0.85 0.95 0.97 0.96  CALCIUM 8.5*  8.5*  < >  --  8.3* 8.7* 8.6* 8.8* 8.9  MG 1.9  --  1.9  --   --  2.0  --   --   PHOS 3.4  --   --   --   --   --   --   --   < > = values in this interval not displayed. Liver Function Tests:  Recent Labs Lab 06/22/15 1738 06/23/15 0245 06/26/15 0405 06/27/15 1130  AST 12* 15 13* 17  ALT 16 16 18 26   ALKPHOS 57 49 57 72  BILITOT 0.5 0.5 0.5 0.4  PROT 7.2 7.0 6.6 6.9  ALBUMIN 2.7*  2.7* 2.5* 2.5* 2.6*   No results for input(s): LIPASE, AMYLASE in the last 168 hours.  Recent Labs Lab  06/24/15 0350 06/26/15 0405  AMMONIA 39* 32   CBC:  Recent Labs Lab 06/22/15 1738 06/23/15 0245 06/24/15 0350 06/26/15 0405 06/27/15 1130  WBC 5.7 5.1 6.8 9.3 7.4  NEUTROABS  --   --   --   --  5.5  HGB 8.3* 8.1* 7.7* 8.5* 8.8*  HCT 31.0* 29.4* 28.0* 30.3* 32.2*  MCV 79.7 78.4 75.5* 77.3*  77.2*  PLT 200 197 218 248 286   Cardiac Enzymes: No results for input(s): CKTOTAL, CKMB, CKMBINDEX, TROPONINI in the last 168 hours. BNP: BNP (last 3 results)  Recent Labs  06/20/15 2337 06/24/15 0350 06/29/15 0557  BNP 293.5* 1007.4* 489.0*    ProBNP (last 3 results) No results for input(s): PROBNP in the last 8760 hours.  CBG:  Recent Labs Lab 06/28/15 1418 06/28/15 1649 06/28/15 2142 06/29/15 0450 06/29/15 0513  GLUCAP 289* 314* 108* 38* 90       Signed:  Debbe Odea, MD [facility] 06/29/2015, 12:06 PM

## 2015-06-29 NOTE — Progress Notes (Signed)
Patient was discharged to home accompanied by NT and patient's daughter. Discharge instructions, medication regimen and education given. Patient verbalized understanding. O2 at 2L via nasal cannula. All personal belongings given. PICC line and IV ( site in good condition removed prior to discharge. Patient has no signs of respiratory distress. CPAP machine was discuss in details  by MD to the patient prior to discharge.

## 2015-06-29 NOTE — Progress Notes (Signed)
Panic blood results: PH 7.49, pCO2 66.5,, PO2 43, Bicarb 50, Acid base access 24, Sat 76% room air. Dr. Wynelle Cleveland notified.

## 2015-06-30 ENCOUNTER — Telehealth: Payer: Self-pay

## 2015-06-30 NOTE — Telephone Encounter (Signed)
[facility] Post-discharge Follow-Up Phone Call:  Date of Discharge:06/29/2015 Principal Discharge Diagnosis(es): hypoglycemia, COPD exacerbation. Acute on chronic respiratory failure, CHF Post-discharge Communication: (Clearly document all attempts clearly and date contact made)  Call Completed: Yes                   With Whom: Patient Interpreter Needed: No     Please check all that apply:  X  Patient is knowledgeable of his/her condition(s) and/or treatment. X  Patient is caring for self at home.  - She said that she is independent but has help at home if needed ? Patient is receiving assist at home from family and/or caregiver. Family and/or caregiver is knowledgeable of patient's condition(s) and/or treatment. ? Patient is receiving home health services. If so, name of agency.     Medication Reconciliation:  ? Medication list reviewed with patient. X  Patient obtained all discharge medications. If not, why?  Yes, she stated that she has all of her medications. She said that she has no new medications and she did not want to review the medication list as she did not have any questions. She has her medications delivered from [employer].  Instructed her to bring all of her medications with her to her appointment with Dr Jarold Song and she stated that she would.    Activities of Daily Living:  X  Independent.  She does have O2 @2L  from [facility] that she states she uses all of the time.  She also has a nebulizer and she reported that she has used since she has been home from the hospital.  She said that she needs a CPAP machine and has an appointment with [facility] on 07/10/15 @ 1515. She noted that she had a sleep study done last year.  ? Needs assist (describe; ? home DME used) ? [facility] (describe, ? home DME used)   Community resources in place for patient:  X  None  ? Home Health/Home DME ? Assisted Living ? Support Group          Patient  Education:Instructed her about the importance of weighing herself daily and keeping a log of her weights. She said that she has a scale but did not weigh herself yet this morning.  She has a glucometer and stated that she has been checking her blood sugars as ordered and her blood sugars have ranged from 140-200 which she noted is normal for her. Instructed her to bring her weight log and blood sugar log to her appointment at the [facility] next week and she stated that she would.        Questions/Concerns discussed:  She said that overall she is doing "okay."  No problems/questions reported. She also said that she did not want to be on the phone for very long because she has a "minute phone."  She was agreeable to scheduling an appointment with the [facility] and an appointment was scheduled for 07/08/15@ 1500. She said that she can arrange medicaid transportation. Instructed her to notify this office if she is experiencing any difficulty with her transportation to the clinic and the clinic will assist her with transportation.

## 2015-07-01 ENCOUNTER — Inpatient Hospital Stay: Payer: Self-pay | Admitting: Family Medicine

## 2015-07-04 ENCOUNTER — Telehealth: Payer: Self-pay

## 2015-07-04 NOTE — Telephone Encounter (Signed)
This Case Manager placed call to patient to remind her of upcoming [facility] appointment on 07/08/15 at 1500 and to ensure she has transportation to her appointment.  Patient aware of her appointment and indicated she has transportation arranged. Reminded patient to bring all medications to her appointment as well as blood glucose log and weight log.  Patient verbalized understanding. No additional concerns/questions identified.

## 2015-07-07 ENCOUNTER — Telehealth: Payer: Self-pay

## 2015-07-07 NOTE — Telephone Encounter (Signed)
Call placed to the patient to remind her of her appointment at [facility] tomorrow, 07/08/15 @ 1500.  She stated that she would be there and has medicaid transportation and was very eager to end the call.

## 2015-07-08 ENCOUNTER — Ambulatory Visit: Payer: Medicaid Other | Attending: Family Medicine | Admitting: Family Medicine

## 2015-07-08 ENCOUNTER — Encounter: Payer: Self-pay | Admitting: Family Medicine

## 2015-07-08 VITALS — BP 120/79 | HR 66 | Temp 98.1°F | Resp 14 | Ht 67.0 in | Wt 269.6 lb

## 2015-07-08 DIAGNOSIS — G9340 Encephalopathy, unspecified: Secondary | ICD-10-CM | POA: Diagnosis not present

## 2015-07-08 DIAGNOSIS — Z794 Long term (current) use of insulin: Secondary | ICD-10-CM

## 2015-07-08 DIAGNOSIS — E119 Type 2 diabetes mellitus without complications: Secondary | ICD-10-CM | POA: Diagnosis present

## 2015-07-08 DIAGNOSIS — Z79899 Other long term (current) drug therapy: Secondary | ICD-10-CM | POA: Diagnosis not present

## 2015-07-08 DIAGNOSIS — Z7982 Long term (current) use of aspirin: Secondary | ICD-10-CM | POA: Insufficient documentation

## 2015-07-08 DIAGNOSIS — F310 Bipolar disorder, current episode hypomanic: Secondary | ICD-10-CM | POA: Diagnosis not present

## 2015-07-08 DIAGNOSIS — J449 Chronic obstructive pulmonary disease, unspecified: Secondary | ICD-10-CM | POA: Diagnosis not present

## 2015-07-08 DIAGNOSIS — G4733 Obstructive sleep apnea (adult) (pediatric): Secondary | ICD-10-CM | POA: Diagnosis not present

## 2015-07-08 DIAGNOSIS — J9622 Acute and chronic respiratory failure with hypercapnia: Secondary | ICD-10-CM | POA: Diagnosis not present

## 2015-07-08 DIAGNOSIS — I10 Essential (primary) hypertension: Secondary | ICD-10-CM | POA: Diagnosis not present

## 2015-07-08 DIAGNOSIS — I5042 Chronic combined systolic (congestive) and diastolic (congestive) heart failure: Secondary | ICD-10-CM | POA: Insufficient documentation

## 2015-07-08 DIAGNOSIS — F319 Bipolar disorder, unspecified: Secondary | ICD-10-CM | POA: Insufficient documentation

## 2015-07-08 LAB — GLUCOSE, POCT (MANUAL RESULT ENTRY): POC GLUCOSE: 88 mg/dL (ref 70–99)

## 2015-07-08 MED ORDER — CARVEDILOL 12.5 MG PO TABS: 12.5000 mg | ORAL_TABLET | Freq: Two times a day (BID) | ORAL | Status: DC

## 2015-07-08 NOTE — Progress Notes (Signed)
Reports no pain Has medications and has taken them No complaints She sees [facility] for her "mental health shot" every three months

## 2015-07-08 NOTE — Progress Notes (Signed)
[place]  Date of telephone encounter: 06/30/15  Admit date: 06/20/15 Discharge date: 06/29/15  PCP: Dr Doreene Burke  Subjective:  Patient ID: Tamara Silva, female    DOB: [birth year]  Age: 55 y.o. MRN: [id number]  CC: Hospitalization Follow-up   HPI Tamara Silva is a 55 year old female with a history of type 2 diabetes mellitus (A1c 7.9), essential hypertension, CHF, bipolar disorder, obstructive sleep apnea, COPD (on 2 L oxygen) recently hospitalized for hypoglycemia (with a glucose of 16) due to unintentional insulin overdose.  She developed acute encephalopathy shortly after presentation with worsening anxiety and confusion, Labs revealed severe respiratory acidosis, elevated ammonia, elevated BNP and chest x-ray was in keeping with pulmonary edema. She was placed on NIPPV due to increased work of breathing, received lactulose (as hyperammonemia was thought to be secondary to questionable hepatic congestion) and was transferred to the ICU. She was on prednisone, diuretics (with a net loss of 20 L) and her symptoms improved over the course of her stay with ammonia levels trending back down to normal; she used nocturnal CPAP during her stay and was recommended to have outpatient CPAP.  Today she reports doing well and appears comfortable on room air but she informs me that dyspnea develops on mild-to-moderate exertion. She has no other complaints today.  Outpatient Prescriptions Prior to Visit  Medication Sig Dispense Refill  . ACCU-CHEK AVIVA PLUS test strip USE 1 STRIP TO CHECK GLUCOSE 3 TIMES DAILY 100 each 11  . ACCU-CHEK SOFTCLIX LANCETS lancets USE 1 LANCET TO CHECK GLUCOSE 3 TIMES DAILY 100 each 11  . aspirin 81 MG tablet Take 1 tablet (81 mg total) by mouth daily.    . cycloSPORINE (RESTASIS) 0.05 % ophthalmic emulsion Place 2 drops into both eyes 2 (two) times daily as needed (for dry eyes).     [name] Kitchen EASY TOUCH PEN NEEDLES 31G X 8 MM MISC USE AS DIRECTED TO INJECT  INSULIN 100 each 11  . EFFIENT 10 MG TABS tablet TAKE ONE TABLET BY MOUTH DAILY. 30 tablet 1  . fluticasone (FLONASE) 50 MCG/ACT nasal spray USE 2 SPRAYS IN EACH NOSTRIL TWICE DAILY 16 g 3  . furosemide (LASIX) 40 MG tablet Take 2 tablets (80 mg total) by mouth 2 (two) times daily. 90 tablet 3  . insulin aspart (NOVOLOG) 100 UNIT/ML injection Inject 15-20 Units into the skin 3 (three) times daily before meals. Sliding scale 10 mL 2  . insulin detemir (LEVEMIR) 100 UNIT/ML injection Inject 0.45 mLs (45 Units total) into the skin at bedtime. 2 vial 6  . INSULIN SYRINGE .5CC/29G 29G X 1/2" 0.5 ML MISC Use as instructed by physician 100 each 12  . ipratropium-albuterol (DUONEB) 0.5-2.5 (3) MG/3ML SOLN Take 3 mLs by nebulization every 6 (six) hours as needed. (Patient taking differently: Take 3 mLs by nebulization every 6 (six) hours as needed (wheezing and shortness of breath). ) 360 mL 12  . nitroGLYCERIN (NITROSTAT) 0.4 MG SL tablet Place 1 tablet (0.4 mg total) under the tongue every 5 (five) minutes as needed for chest pain. MUST MAKE APPOINTMENT FOR FUTURE REFILLS 25 tablet 0  . omeprazole (PRILOSEC) 40 MG capsule TAKE 1 CAPSULE BY MOUTH TWICE DAILY 60 capsule 3  . ondansetron (ZOFRAN ODT) 8 MG disintegrating tablet Take 1 tablet (8 mg total) by mouth every 8 (eight) hours as needed for nausea or vomiting. 12 tablet 0  . Paliperidone Palmitate (INVEGA SUSTENNA) 117 MG/0.75ML SUSP Inject 156 mg into the muscle every 3 (three) months.     [name] Kitchen  pravastatin (PRAVACHOL) 80 MG tablet Take 1 tablet (80 mg total) by mouth every evening. 30 tablet 3  . prazosin (MINIPRESS) 5 MG capsule TAKE 1 CAPSULE BY MOUTH EVERY NIGHT AT BEDTIME FOR NIGHTMARES (Patient taking differently: TAKE 1 CAPSULE BY MOUTH EVERY NIGHT AT BEDTIME AS NEEDED FOR NIGHTMARES) 30 capsule 3  . PROAIR HFA 108 (90 BASE) MCG/ACT inhaler INHALE 2 PUFFS BY MOUTH 4 TIMES DAILY AS NEEDED (Patient taking differently: INHALE 2 PUFFS BY MOUTH 4 TIMES  DAILY AS NEEDED FOR SHORTNES OF BREATH) 1 Inhaler 3  . spironolactone (ALDACTONE) 25 MG tablet Take 1 tablet (25 mg total) by mouth daily. 90 tablet 3  . carvedilol (COREG) 12.5 MG tablet Take 1 tablet (12.5 mg total) by mouth 2 (two) times daily with a meal. 60 tablet 0  . promethazine (PHENERGAN) 25 MG tablet Take 1 tablet (25 mg total) by mouth every 6 (six) hours as needed for nausea or vomiting. 15 tablet 0   No facility-administered medications prior to visit.    ROS Review of Systems  Constitutional: Negative for activity change, appetite change and fatigue.  HENT: Negative for congestion, sinus pressure and sore throat.   Eyes: Negative for visual disturbance.  Respiratory: Negative for cough, chest tightness, shortness of breath and wheezing.   Cardiovascular: Negative for chest pain and palpitations.  Gastrointestinal: Negative for abdominal pain, constipation and abdominal distention.  Endocrine: Negative for polydipsia.  Genitourinary: Negative for dysuria and frequency.  Musculoskeletal: Negative for back pain and arthralgias.  Skin: Negative for rash.  Neurological: Negative for tremors, light-headedness and numbness.  Hematological: Does not bruise/bleed easily.  Psychiatric/Behavioral: Negative for behavioral problems and agitation.    Objective:  BP 120/79 mmHg  Pulse 66  Temp(Src) 98.1 F (36.7 C)  Resp 14  Ht 5\' 7"  (1.702 m)  Wt 269 lb 9.6 oz (122.29 kg)  BMI 42.22 kg/m2  SpO2 96%  LMP 05/30/2013  BP/Weight 07/08/2015 06/29/2015 99991111  Systolic BP 123456 123456 [id number]  Diastolic BP 79 57 53  Wt. (Lbs) 269.6 267.4 -  BMI 42.22 41.24 -      Physical Exam Constitutional: normal appearing,  Eyes: PERRLA HEENT: Head is atraumatic, normal sinuses, normal oropharynx, normal appearing tonsils and palate, tympanic membrane is normal bilaterally. Neck: normal range of motion, no thyromegaly, no JVD Cardiovascular: normal rate and rhythm, normal heart sounds, no  murmurs, rub or gallop, no pedal edema Respiratory: clear to auscultation bilaterally, no wheezes, no rales, no rhonchi Abdomen: soft, not tender to palpation, normal bowel sounds, no enlarged organs Extremities: Full ROM, no tenderness in joints Skin: warm and dry, no lesions. Neurological: alert, oriented x3, cranial nerves I-XII grossly intact , normal motor strength, normal sensation. Psychological: normal mood.   Assessment & Plan:   1. Type 2 diabetes mellitus without complication, with long-term current use of insulin (HCC) A1c is 7.9 which is above goal of <7.0 Will make no changes to regimen - Glucose (CBG) - COMPLETE METABOLIC PANEL WITH GFR  2. Essential hypertension Controlled  3. Chronic combined systolic and diastolic congestive heart failure, NYHA class 2; EF 30-35% No evidence of fluid overload Weight has been stable Not currently followed by Cardiology  4. Bipolar affective disorder, current episode hypomanic ([place]) Currently on Invega shots Keep appointment with mental Health  5. OSA (obstructive sleep apnea) She needs a CPAP machine as per hospital notes Will defer to Pulmonary whom she sees in a few days for managment  6. COPD GOLD III  No evidence of acute exacerbation On chronic oxygen  7. Acute on chronic respiratory failure with hypercapnia (HCC) No respiratory distress at this time Will repeat labs   Meds ordered this encounter  Medications  . carvedilol (COREG) 12.5 MG tablet    Sig: Take 1 tablet (12.5 mg total) by mouth 2 (two) times daily with a meal.    Dispense:  60 tablet    Refill:  2    Follow-up: Return in about 2 weeks (around 07/22/2015) for [facility]CC-follow-up on diabetes mellitus.   Arnoldo Morale MD

## 2015-07-08 NOTE — Patient Instructions (Signed)
Diabetes Mellitus and Food It is important for you to manage your blood sugar (glucose) level. Your blood glucose level can be greatly affected by what you eat. Eating healthier foods in the appropriate amounts throughout the day at about the same time each day will help you control your blood glucose level. It can also help slow or prevent worsening of your diabetes mellitus. Healthy eating may even help you improve the level of your blood pressure and reach or maintain a healthy weight.  General recommendations for healthful eating and cooking habits include:  Eating meals and snacks regularly. Avoid going long periods of time without eating to lose weight.  Eating a diet that consists mainly of plant-based foods, such as fruits, vegetables, nuts, legumes, and whole grains.  Using low-heat cooking methods, such as baking, instead of high-heat cooking methods, such as deep frying. Work with your dietitian to make sure you understand how to use the Nutrition Facts information on food labels. HOW CAN FOOD AFFECT ME? Carbohydrates Carbohydrates affect your blood glucose level more than any other type of food. Your dietitian will help you determine how many carbohydrates to eat at each meal and teach you how to count carbohydrates. Counting carbohydrates is important to keep your blood glucose at a healthy level, especially if you are using insulin or taking certain medicines for diabetes mellitus. Alcohol Alcohol can cause sudden decreases in blood glucose (hypoglycemia), especially if you use insulin or take certain medicines for diabetes mellitus. Hypoglycemia can be a life-threatening condition. Symptoms of hypoglycemia (sleepiness, dizziness, and disorientation) are similar to symptoms of having too much alcohol.  If your health care provider has given you approval to drink alcohol, do so in moderation and use the following guidelines:  Women should not have more than one drink per day, and men  should not have more than two drinks per day. One drink is equal to:  12 oz of beer.  5 oz of wine.  1 oz of hard liquor.  Do not drink on an empty stomach.  Keep yourself hydrated. Have water, diet soda, or unsweetened iced tea.  Regular soda, juice, and other mixers might contain a lot of carbohydrates and should be counted. WHAT FOODS ARE NOT RECOMMENDED? As you make food choices, it is important to remember that all foods are not the same. Some foods have fewer nutrients per serving than other foods, even though they might have the same number of calories or carbohydrates. It is difficult to get your body what it needs when you eat foods with fewer nutrients. Examples of foods that you should avoid that are high in calories and carbohydrates but low in nutrients include:  Trans fats (most processed foods list trans fats on the Nutrition Facts label).  Regular soda.  Juice.  Candy.  Sweets, such as cake, pie, doughnuts, and cookies.  Fried foods. WHAT FOODS CAN I EAT? Eat nutrient-rich foods, which will nourish your body and keep you healthy. The food you should eat also will depend on several factors, including:  The calories you need.  The medicines you take.  Your weight.  Your blood glucose level.  Your blood pressure level.  Your cholesterol level. You should eat a variety of foods, including:  Protein.  Lean cuts of meat.  Proteins low in saturated fats, such as fish, egg whites, and beans. Avoid processed meats.  Fruits and vegetables.  Fruits and vegetables that may help control blood glucose levels, such as apples, mangoes, and   yams.  Dairy products.  Choose fat-free or low-fat dairy products, such as milk, yogurt, and cheese.  Grains, bread, pasta, and rice.  Choose whole grain products, such as multigrain bread, whole oats, and brown rice. These foods may help control blood pressure.  Fats.  Foods containing healthful fats, such as nuts,  avocado, olive oil, canola oil, and fish. DOES EVERYONE WITH DIABETES MELLITUS HAVE THE SAME MEAL PLAN? Because every person with diabetes mellitus is different, there is not one meal plan that works for everyone. It is very important that you meet with a dietitian who will help you create a meal plan that is just right for you.   This information is not intended to replace advice given to you by your health care provider. Make sure you discuss any questions you have with your health care provider.   Document Released: 02/11/2005 Document Revised: 06/07/2014 Document Reviewed: 04/13/2013 Elsevier Interactive Patient Education 2016 Elsevier Inc.  

## 2015-07-09 LAB — COMPLETE METABOLIC PANEL WITH GFR
ALBUMIN: 3.5 g/dL — AB (ref 3.6–5.1)
ALK PHOS: 68 U/L (ref 33–130)
ALT: 18 U/L (ref 6–29)
AST: 16 U/L (ref 10–35)
BILIRUBIN TOTAL: 0.3 mg/dL (ref 0.2–1.2)
BUN: 13 mg/dL (ref 7–25)
CO2: 32 mmol/L — ABNORMAL HIGH (ref 20–31)
Calcium: 9.1 mg/dL (ref 8.6–10.4)
Chloride: 100 mmol/L (ref 98–110)
Creat: 0.89 mg/dL (ref 0.50–1.05)
GFR, Est African American: 84 mL/min (ref >=60)
GFR, Est Non African American: 73 mL/min (ref >=60)
GLUCOSE: 90 mg/dL (ref 65–99)
POTASSIUM: 4.2 mmol/L (ref 3.5–5.3)
Sodium: 140 mmol/L (ref 135–146)
TOTAL PROTEIN: 7 g/dL (ref 6.1–8.1)

## 2015-07-10 ENCOUNTER — Ambulatory Visit (INDEPENDENT_AMBULATORY_CARE_PROVIDER_SITE_OTHER)
Admission: RE | Admit: 2015-07-10 | Discharge: 2015-07-10 | Disposition: A | Discharge status: Home / Self Care | Payer: Medicaid Other | Source: Ambulatory Visit | Attending: Adult Health | Admitting: Adult Health

## 2015-07-10 ENCOUNTER — Telehealth: Payer: Self-pay | Admitting: *Deleted

## 2015-07-10 ENCOUNTER — Ambulatory Visit (INDEPENDENT_AMBULATORY_CARE_PROVIDER_SITE_OTHER): Payer: Medicaid Other | Admitting: Adult Health

## 2015-07-10 ENCOUNTER — Encounter: Payer: Self-pay | Admitting: Adult Health

## 2015-07-10 VITALS — BP 132/76 | HR 82 | Temp 98.3°F | Ht 67.5 in | Wt 269.0 lb

## 2015-07-10 DIAGNOSIS — J449 Chronic obstructive pulmonary disease, unspecified: Secondary | ICD-10-CM

## 2015-07-10 DIAGNOSIS — E662 Morbid (severe) obesity with alveolar hypoventilation: Secondary | ICD-10-CM | POA: Diagnosis not present

## 2015-07-10 DIAGNOSIS — G4733 Obstructive sleep apnea (adult) (pediatric): Secondary | ICD-10-CM | POA: Diagnosis not present

## 2015-07-10 DIAGNOSIS — J441 Chronic obstructive pulmonary disease with (acute) exacerbation: Secondary | ICD-10-CM | POA: Diagnosis not present

## 2015-07-10 NOTE — Assessment & Plan Note (Addendum)
GOLD III COPD   Plan  Begin ANORO 1 puff daily  Chest xray today .  Follow up with Dr. Murlean Iba  in 2 months and As needed   Please contact office for sooner follow up if symptoms do not improve or worsen or seek emergency care

## 2015-07-10 NOTE — Telephone Encounter (Signed)
-----   Message from Arnoldo Morale, MD sent at 07/09/2015  8:22 AM EST ----- Labs are stable

## 2015-07-10 NOTE — Patient Instructions (Addendum)
Begin CPAP At bedtime   Goal is to wear for at least 4-6 hr each night.  Do not drive if sleepy .  Work on weight loss.  Download in 1 month .  Begin ANORO 1 puff daily  Chest xray today .  Follow up with Dr. Murlean Iba  in 2 months and As needed   Please contact office for sooner follow up if symptoms do not improve or worsen or seek emergency care

## 2015-07-10 NOTE — Progress Notes (Signed)
Subjective:    Patient ID: Tamara Silva, female    DOB: 1961-03-01,   MRN: [id number]  HPI  32 yobf quit smoking in 04/2014  Prev eval by Dr Gwenette Greet just for osa but self referred for sob 01/07/2015 to pulmonary clinic.     History of Present Illness  01/07/2015 1st [facility] office visit for non-sleep issues / [place]   Chief Complaint  Patient presents with  . Pulmonary Consult    Self referral. Pt states she was dxed with COPD and Asthma approx 7 yrs ago. She states that she was doing good "until my machine broke"neb machine stopped working approx 2 months ago and until then she had been using duoneb qid.    extremely easily confused with details of care/ apparently chronically on acei and coreg and duoneb qid and already used the latter twice before 230 pm ov and feels she needs another one at ov  and no better since quit smoking  >>change ACE to Diovan    07/10/2015 [facility] follow up  : OSA/OHS   And COPD  Pt returns for a post hospital follow up.  Recent admission for acute on chronic hypercarbic RF , COPD exacerbation and decompensated CHF.  She was treated with BIPAP support., abx, sterodis and diuresis .  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  Sleep study done 11/2014 showed Mild OSA with AHI at 5.7, mild desats ~82%.  Spirometry 12/2014 showed FEV1 at 34%, ratio 66.  She is not on any COPD maintenance rx.  She was treated with BIPAP and CPAP during hospital stay.  She does not have a machine at home.     Current Medications, Allergies, Complete Past Medical History, Past Surgical History, Family History, and Social History were reviewed in [employer] record.              Review of Systems  Constitutional: Negative for fever, chills and unexpected weight change.  HENT: Negative for  congestion, dental problem, ear pain, nosebleeds, postnasal drip, rhinorrhea, sinus pressure, sneezing, sore throat, trouble swallowing and voice change.   Eyes: Negative for visual disturbance.  Respiratory: Positive for shortness of breath. Negative for cough and choking.   Cardiovascular: Negative for chest pain and leg swelling.  Gastrointestinal: Negative for vomiting, abdominal pain and diarrhea.  Genitourinary: Negative for difficulty urinating.  Musculoskeletal: Negative for arthralgias.  Skin: Negative for rash.  Neurological: Negative for tremors, syncope and headaches.  Hematological: Does not bruise/bleed easily.       Objective:   Physical Exam  Obese female  Filed Vitals:   07/10/15 1528  BP: 132/76  Pulse: 82  Temp: 98.3 F (36.8 C)  Height: 5' 7.5" (1.715 m)  Weight: 269 lb (122.018 kg)  SpO2: 95%   Body mass index is 41.49 kg/(m^2).   Vital signs reviewed   HEENT: nl dentition, turbinates, and orophanx. Nl external ear canals without cough reflex, Class 2-3 MP airway    NECK :  without JVD/Nodes/TM/ nl carotid upstrokes bilaterally   LUNGS: no acc muscle use, clear to A and P bilaterally without cough on insp or exp maneuvers   CV:  RRR  no s3 or murmur or increase in P2, no edema   ABD:  soft and nontender with nl excursion in the supine position. No bruits or organomegaly, bowel  sounds nl  MS:  warm without deformities, calf tenderness, cyanosis or clubbing  SKIN: warm and dry without lesions    NEURO:  alert, approp, no deficits   CXR PA and Lateral:   01/07/2015 :     \  Findings similar to the prior chest radiograph. There is vascular congestion without overt pulmonary edema. Mild cardiomegaly. Minimal left effusion. No evidence of pneumonia.              Assessment & Plan:

## 2015-07-10 NOTE — Telephone Encounter (Signed)
Left HIPAA compliant message for patient to return call.

## 2015-07-11 NOTE — Telephone Encounter (Signed)
Verified name and date of birth and gave results 

## 2015-07-15 NOTE — Progress Notes (Signed)
Quick Note:  Called and spoke with pt. Reviewed results and recs. Pt voiced understanding and had no further questions. ______ 

## 2015-07-17 ENCOUNTER — Telehealth: Payer: Self-pay

## 2015-07-17 NOTE — Telephone Encounter (Signed)
This Case Manager placed call to patient to check on status and to remind her of upcoming [facility] appointment on 07/22/15 at 1500 with Dr. Jarold Song. Unable to reach patient; voicemail left requesting return call.

## 2015-07-21 ENCOUNTER — Telehealth: Payer: Self-pay

## 2015-07-21 NOTE — Telephone Encounter (Signed)
Call placed to the patient to confirm her appointment for tomorrow. She said that she wanted to re-schedule her appointment for tomorrow. She noted that she has seen pulmonary and is waiting for her CPAP. She said that she would like to have the CPAP before seeing Dr Jarold Song so that she could tell her " how I am doing."   Her appointment was re-scheduled for 08/05/15 @ 1430.  She patient said that she would be able to arrange transportation. She said that her neck has been hurting her because of how she slept on it but she did not need to see the doctor.   No other questions/concerns reported.

## 2015-07-21 NOTE — Assessment & Plan Note (Signed)
OHS /OSA with AHI 5.7 on sleep study and recent admission with hypercarbia Will set up for CPAP At bedtime    Plan  Begin CPAP At bedtime   Goal is to wear for at least 4-6 hr each night.  Do not drive if sleepy .  Work on weight loss.  Download in 1 month .  Follow up with Dr. Murlean Iba  in 2 months and As needed   Please contact office for sooner follow up if symptoms do not improve or worsen or seek emergency care

## 2015-07-22 ENCOUNTER — Ambulatory Visit: Payer: Self-pay | Admitting: Family Medicine

## 2015-07-28 ENCOUNTER — Other Ambulatory Visit: Payer: Self-pay | Admitting: Internal Medicine

## 2015-07-29 ENCOUNTER — Telehealth: Payer: Self-pay

## 2015-07-29 NOTE — Telephone Encounter (Signed)
Call placed to the patient to check on her status. She stated that she did not want to talk because she was running out of minutes on her phone. She said that she was feeling " okay" but needed a glucometer. She said that the glucometer that she had was a friend's glucometer. She requested that the order for the glucometer be sent to [facility] at [facility]. She also requested that an order for  the testing supplies and lancets be sent to the same [facility] because she did not want to wait for a delivery from [employer]. Informed her that Dr Jarold Song would be notified.  No other problems/questions reported.   In basket message sent to Dr Jarold Song with the patient's request.

## 2015-07-30 ENCOUNTER — Other Ambulatory Visit: Payer: Self-pay

## 2015-07-30 ENCOUNTER — Telehealth: Payer: Self-pay

## 2015-07-30 DIAGNOSIS — E1165 Type 2 diabetes mellitus with hyperglycemia: Principal | ICD-10-CM

## 2015-07-30 DIAGNOSIS — Z794 Long term (current) use of insulin: Principal | ICD-10-CM

## 2015-07-30 DIAGNOSIS — IMO0001 Reserved for inherently not codable concepts without codable children: Secondary | ICD-10-CM

## 2015-07-30 MED ORDER — ACCU-CHEK AVIVA CONNECT W/DEVICE KIT: 1.0000 | PACK | Freq: Three times a day (TID) | Status: DC

## 2015-07-30 MED ORDER — ACCU-CHEK SOFTCLIX LANCETS MISC: Status: DC

## 2015-07-30 MED ORDER — GLUCOSE BLOOD VI STRP: ORAL_STRIP | Status: DC

## 2015-07-30 NOTE — Telephone Encounter (Signed)
This Case Manager spoke with Dr. Jarold Song who gave verbal order for glucometer to be ordered and diabetes monitoring supplies to be reordered and sent to [facility] on [address]. Glucometer, test strips, and lancets ordered per verbal order.  Call placed to patient to update. Unable to reach patient or leave voicemail at number [phone] as voicemail box full. In addition, call patient to [phone]; unable to reach patient. Voicemail left requesting return call.

## 2015-07-31 ENCOUNTER — Ambulatory Visit: Payer: Self-pay | Admitting: Family Medicine

## 2015-08-04 ENCOUNTER — Telehealth: Payer: Self-pay

## 2015-08-04 NOTE — Telephone Encounter (Signed)
Call placed to the patient to check on her status, to inquire if she obtained her glucometer and to remind her of her [facility] appointment tomorrow, 08/05/15.  She said that she is feeling " okay."  No problems reported. She noted that she wants to reschedule her appointment for tomorrow because she wants to have the CPAP machine before she comes back to the clinic. She noted that she is begin fit for the CPAP on 08/06/15 and her [facility] appointment was re-scheduled for 08/12/15 @ 1430.  She said that she did not pick up the glucometer because it is not covered by medicaid.   This CM placed a call the [facility] # [phone] and spoke to [place]. He noted that medicaid does not cover the accu-chek connect( which was ordered) , only the accu-chek. In basket message sent to Dr Jarold Song requesting a new prescription be sent to the patient's pharmacy for an accu-chek.   Call then placed to the patient again to inform her that a prescription for an accu-check has been requested from the doctor. She was very appreciative of the call.

## 2015-08-05 ENCOUNTER — Ambulatory Visit: Payer: Self-pay | Admitting: Family Medicine

## 2015-08-06 ENCOUNTER — Other Ambulatory Visit: Payer: Self-pay | Admitting: Family Medicine

## 2015-08-06 DIAGNOSIS — E1100 Type 2 diabetes mellitus with hyperosmolarity without nonketotic hyperglycemic-hyperosmolar coma (NKHHC): Secondary | ICD-10-CM

## 2015-08-06 MED ORDER — ACCU-CHEK AVIVA DEVI: Status: DC

## 2015-08-07 ENCOUNTER — Telehealth: Payer: Self-pay

## 2015-08-07 ENCOUNTER — Other Ambulatory Visit: Payer: Self-pay | Admitting: *Deleted

## 2015-08-07 MED ORDER — ACCU-CHEK FASTCLIX LANCETS MISC: 1.0000 | Freq: Three times a day (TID) | Status: DC

## 2015-08-07 NOTE — Telephone Encounter (Signed)
This Case Manager placed call to patient to inform her that Accuchek Aviva had been reordered for her by Dr. Jarold Song.  Patient indicated she had already picked up the glucometer.  Informed patient how often to use meter and also discussed importance of keeping a blood glucose log. Informed patient to bring blood glucose log to her appointment on 08/12/15 for Dr. Jarold Song to review. Patient indicated she had "already done that" and she was "not doing that anymore."  Patient indicated she planned to be at her upcoming appointment on 08/12/15. No additional needs/concerns identified.

## 2015-08-12 ENCOUNTER — Encounter: Payer: Self-pay | Admitting: Family Medicine

## 2015-08-12 ENCOUNTER — Ambulatory Visit: Payer: Medicaid Other | Attending: Family Medicine | Admitting: Family Medicine

## 2015-08-12 VITALS — BP 131/81 | HR 72 | Temp 98.5°F | Resp 16 | Ht 67.5 in | Wt 270.0 lb

## 2015-08-12 DIAGNOSIS — J45909 Unspecified asthma, uncomplicated: Secondary | ICD-10-CM | POA: Insufficient documentation

## 2015-08-12 DIAGNOSIS — Z7982 Long term (current) use of aspirin: Secondary | ICD-10-CM | POA: Diagnosis not present

## 2015-08-12 DIAGNOSIS — G4733 Obstructive sleep apnea (adult) (pediatric): Secondary | ICD-10-CM | POA: Insufficient documentation

## 2015-08-12 DIAGNOSIS — I10 Essential (primary) hypertension: Secondary | ICD-10-CM

## 2015-08-12 DIAGNOSIS — Z955 Presence of coronary angioplasty implant and graft: Secondary | ICD-10-CM | POA: Insufficient documentation

## 2015-08-12 DIAGNOSIS — Z794 Long term (current) use of insulin: Secondary | ICD-10-CM | POA: Insufficient documentation

## 2015-08-12 DIAGNOSIS — I5042 Chronic combined systolic (congestive) and diastolic (congestive) heart failure: Secondary | ICD-10-CM | POA: Diagnosis not present

## 2015-08-12 DIAGNOSIS — Z9889 Other specified postprocedural states: Secondary | ICD-10-CM | POA: Diagnosis not present

## 2015-08-12 DIAGNOSIS — Z9861 Coronary angioplasty status: Secondary | ICD-10-CM

## 2015-08-12 DIAGNOSIS — F319 Bipolar disorder, unspecified: Secondary | ICD-10-CM | POA: Diagnosis not present

## 2015-08-12 DIAGNOSIS — E119 Type 2 diabetes mellitus without complications: Secondary | ICD-10-CM | POA: Insufficient documentation

## 2015-08-12 DIAGNOSIS — E1165 Type 2 diabetes mellitus with hyperglycemia: Secondary | ICD-10-CM | POA: Diagnosis not present

## 2015-08-12 DIAGNOSIS — M545 Low back pain: Secondary | ICD-10-CM | POA: Diagnosis not present

## 2015-08-12 DIAGNOSIS — J449 Chronic obstructive pulmonary disease, unspecified: Secondary | ICD-10-CM | POA: Diagnosis not present

## 2015-08-12 DIAGNOSIS — Z79899 Other long term (current) drug therapy: Secondary | ICD-10-CM | POA: Diagnosis not present

## 2015-08-12 DIAGNOSIS — G8929 Other chronic pain: Secondary | ICD-10-CM | POA: Diagnosis not present

## 2015-08-12 DIAGNOSIS — I2510 Atherosclerotic heart disease of native coronary artery without angina pectoris: Secondary | ICD-10-CM | POA: Diagnosis not present

## 2015-08-12 DIAGNOSIS — F310 Bipolar disorder, current episode hypomanic: Secondary | ICD-10-CM

## 2015-08-12 LAB — GLUCOSE, POCT (MANUAL RESULT ENTRY): POC GLUCOSE: 163 mg/dL — AB (ref 70–99)

## 2015-08-12 NOTE — Progress Notes (Signed)
Follow up Reports chronic back pain Would like a different O2 set up

## 2015-08-12 NOTE — Progress Notes (Signed)
Subjective:    Patient ID: Tamara Silva, female    DOB: April 26, 1961, 55 y.o.   MRN: [id number]  HPI She is a 55 year old female with a history of type 2 diabetes mellitus (A1c 7.9), essential hypertension, CHF, bipolar disorder, obstructive sleep apnea, COPD (on 2 L oxygen) here for follow-up visit.  Since her last office visit she has been seen by [facility] where she received a sample of Anoro and was supposed to remain on it but this was not prescribed for her and she has completed the sample she received.  She recently received a CPAP machine and started using it. Denies any shortness of breath and is maintained on 2 L of oxygen but she informs me she would like to have a continuous supplemental oxygen rather than intermittent. She also has chronic low back pain with no radiation to her lower extremities.   Past Medical History  Diagnosis Date  . Diabetes mellitus without complication ([place])   . Asthma     Past Surgical History  Procedure Laterality Date  . Coronary angioplasty with stent placement      CAD in 2006 x 2 and 2009 2 more- place din REx in [place] and  med  . Esophagogastroduodenoscopy (egd) with propofol N/A 08/03/2013    Procedure: ESOPHAGOGASTRODUODENOSCOPY (EGD) WITH PROPOFOL;  Surgeon: Jeryl Columbia, MD;  Location: [facility];  Service: Endoscopy;  Laterality: N/A;  . Colonoscopy with propofol N/A 08/03/2013    Procedure: COLONOSCOPY WITH PROPOFOL;  Surgeon: Jeryl Columbia, MD;  Location: [facility];  Service: Endoscopy;  Laterality: N/A;  . Coronary angioplasty with stent placement  10/07/2013    Xience Alpine DES 2.75  mm x 15  mm  . Left heart catheterization with coronary angiogram N/A 10/07/2013    Procedure: LEFT HEART CATHETERIZATION WITH CORONARY ANGIOGRAM;  Surgeon: Leonie Man, MD;  [facility]: [facility];  Service: Cardiovascular;  Laterality: N/A;    Allergies  Allergen Reactions  . Metolazone Other (See Comments)    Dizziness and  falling  . Plavix [Clopidogrel Bisulfate] Other (See Comments)    High PRU's - non-responder  . Nsaids Other (See Comments)    "I do not take NSAIDs d/t interfering with other meds"    Current Outpatient Prescriptions on File Prior to Visit  Medication Sig Dispense Refill  . ACCU-CHEK FASTCLIX LANCETS MISC 1 applicator by Does not apply route 4 (four) times daily -  before meals and at bedtime. 100 each 11  . ACCU-CHEK SOFTCLIX LANCETS lancets USE 1 LANCET TO CHECK GLUCOSE 3 TIMES DAILY 100 each 11  . aspirin 81 MG tablet Take 1 tablet (81 mg total) by mouth daily.    . Blood Glucose Monitoring Suppl (ACCU-CHEK AVIVA) device Use as instructed 3 times daily before meals. 1 each 0  . carvedilol (COREG) 12.5 MG tablet Take 1 tablet (12.5 mg total) by mouth 2 (two) times daily with a meal. 60 tablet 2  . cycloSPORINE (RESTASIS) 0.05 % ophthalmic emulsion Place 2 drops into both eyes 2 (two) times daily as needed (for dry eyes).     [name] Kitchen EASY TOUCH PEN NEEDLES 31G X 8 MM MISC USE AS DIRECTED TO INJECT INSULIN 100 each 11  . EFFIENT 10 MG TABS tablet TAKE ONE TABLET BY MOUTH DAILY. 30 tablet 1  . fluticasone (FLONASE) 50 MCG/ACT nasal spray USE 2 SPRAYS IN EACH NOSTRIL TWICE DAILY 16 g 2  . furosemide (LASIX) 40 MG tablet Take 2 tablets (  80 mg total) by mouth 2 (two) times daily. 90 tablet 3  . glucose blood (ACCU-CHEK AVIVA PLUS) test strip USE 1 STRIP TO CHECK GLUCOSE 3 TIMES DAILY 100 each 11  . insulin aspart (NOVOLOG) 100 UNIT/ML injection Inject 15-20 Units into the skin 3 (three) times daily before meals. Sliding scale 10 mL 2  . insulin detemir (LEVEMIR) 100 UNIT/ML injection Inject 0.45 mLs (45 Units total) into the skin at bedtime. 2 vial 6  . INSULIN SYRINGE .5CC/29G 29G X 1/2" 0.5 ML MISC Use as instructed by physician 100 each 12  . ipratropium-albuterol (DUONEB) 0.5-2.5 (3) MG/3ML SOLN Take 3 mLs by nebulization every 6 (six) hours as needed. (Patient taking differently: Take 3 mLs by  nebulization every 6 (six) hours as needed (wheezing and shortness of breath). ) 360 mL 12  . nitroGLYCERIN (NITROSTAT) 0.4 MG SL tablet Place 1 tablet (0.4 mg total) under the tongue every 5 (five) minutes as needed for chest pain. MUST MAKE APPOINTMENT FOR FUTURE REFILLS 25 tablet 0  . omeprazole (PRILOSEC) 40 MG capsule TAKE 1 CAPSULE BY MOUTH TWICE DAILY 60 capsule 2  . ondansetron (ZOFRAN ODT) 8 MG disintegrating tablet Take 1 tablet (8 mg total) by mouth every 8 (eight) hours as needed for nausea or vomiting. 12 tablet 0  . Paliperidone Palmitate (INVEGA SUSTENNA) 117 MG/0.75ML SUSP Inject 156 mg into the muscle every 3 (three) months.     . pravastatin (PRAVACHOL) 80 MG tablet Take 1 tablet (80 mg total) by mouth every evening. 30 tablet 3  . prazosin (MINIPRESS) 5 MG capsule TAKE 1 CAPSULE BY MOUTH EVERY NIGHT AT BEDTIME FOR NIGHTMARES (Patient taking differently: TAKE 1 CAPSULE BY MOUTH EVERY NIGHT AT BEDTIME AS NEEDED FOR NIGHTMARES) 30 capsule 3  . PROAIR HFA 108 (90 Base) MCG/ACT inhaler INHALE 2 PUFFS BY MOUTH 4 TIMES DAILY AS NEEDED 8.5 each 2  . spironolactone (ALDACTONE) 25 MG tablet Take 1 tablet (25 mg total) by mouth daily. 90 tablet 3   No current facility-administered medications on file prior to visit.     Review of Systems Constitutional: Negative for activity change, appetite change and fatigue.  HENT: Negative for congestion, sinus pressure and sore throat.   Eyes: Negative for visual disturbance.  Respiratory: Negative for cough, chest tightness, shortness of breath and wheezing.   Cardiovascular: Negative for chest pain and palpitations.  Gastrointestinal: Negative for abdominal pain, constipation and abdominal distention.  Endocrine: Negative for polydipsia.  Genitourinary: Negative for dysuria and frequency.  Musculoskeletal: Positive for back pain and negative for arthralgias.  Skin: Negative for rash.  Neurological: Negative for tremors, light-headedness and  numbness.  Hematological: Does not bruise/bleed easily.  Psychiatric/Behavioral: Negative for behavioral problems and agitation.      Objective: Filed Vitals:   08/12/15 1526  BP: 131/81  Pulse: 72  Temp: 98.5 F (36.9 C)  Resp: 16  Height: 5' 7.5" (1.715 m)  Weight: 270 lb (122.471 kg)  SpO2: 96%      Physical Exam  Constitutional: normal appearing,  HEENT: Head is atraumatic, normal sinuses, normal oropharynx, normal appearing tonsils and palate, tympanic membrane is normal bilaterally, currently on 2 L of oxygen via nasal cannula Neck: normal range of motion, no thyromegaly, no JVD Cardiovascular: normal rate and rhythm, normal heart sounds, no murmurs, rub or gallop, no pedal edema Respiratory: clear to auscultation bilaterally, no wheezes, no rales, no rhonchi Abdomen: soft, not tender to palpation, normal bowel sounds, no enlarged organs Extremities: Full  ROM, no tenderness in joints Skin: warm and dry, no lesions. Neurological: alert, oriented x3, cranial nerves I-XII grossly intact , normal motor strength, normal sensation. Psychological: normal mood.      Assessment & Plan:  1. Type 2 diabetes mellitus without complication, with long-term current use of insulin (HCC) A1c is 7.9 which is above goal of <7.0 Will make no changes to regimen Continue Levemir and NovoLog  2. Essential hypertension Controlled Continue antihypertensive, low-sodium,DASH diet  3. Chronic combined systolic and diastolic congestive heart failure, NYHA class 2; EF 30-35% and CAD s/p stent No evidence of fluid overload Weight has been stable Continue spironolactone and Lasix Remains on Plavix Not currently followed by Cardiology  4. Bipolar affective disorder, current episode hypomanic ([place]) Currently on Invega shots Keep appointment with mental Health  5. OSA (obstructive sleep apnea) Recently obtained a CPAP machine   6. COPD GOLD III  No evidence of acute exacerbation On  chronic oxygen Patient to call her DME company-[employer] to adjust her equipment  7. Back pain Advised to apply heat. Will hold off on NSAIDs as she is allergic

## 2015-08-16 ENCOUNTER — Other Ambulatory Visit: Payer: Self-pay | Admitting: Internal Medicine

## 2015-08-19 ENCOUNTER — Other Ambulatory Visit: Payer: Self-pay | Admitting: *Deleted

## 2015-08-19 MED ORDER — NITROGLYCERIN 0.4 MG SL SUBL: 0.4000 mg | SUBLINGUAL_TABLET | SUBLINGUAL | Status: DC | PRN

## 2015-08-25 ENCOUNTER — Other Ambulatory Visit: Payer: Self-pay | Admitting: Internal Medicine

## 2015-09-08 ENCOUNTER — Encounter: Payer: Self-pay | Admitting: Pulmonary Disease

## 2015-09-08 ENCOUNTER — Ambulatory Visit (INDEPENDENT_AMBULATORY_CARE_PROVIDER_SITE_OTHER): Payer: Medicaid Other | Admitting: Pulmonary Disease

## 2015-09-08 VITALS — BP 118/62 | HR 70 | Ht 67.5 in | Wt 266.0 lb

## 2015-09-08 DIAGNOSIS — J441 Chronic obstructive pulmonary disease with (acute) exacerbation: Secondary | ICD-10-CM

## 2015-09-08 DIAGNOSIS — J449 Chronic obstructive pulmonary disease, unspecified: Secondary | ICD-10-CM

## 2015-09-08 DIAGNOSIS — R0902 Hypoxemia: Secondary | ICD-10-CM

## 2015-09-08 DIAGNOSIS — G4733 Obstructive sleep apnea (adult) (pediatric): Secondary | ICD-10-CM

## 2015-09-08 DIAGNOSIS — I5022 Chronic systolic (congestive) heart failure: Secondary | ICD-10-CM

## 2015-09-08 DIAGNOSIS — I509 Heart failure, unspecified: Secondary | ICD-10-CM | POA: Insufficient documentation

## 2015-09-08 MED ORDER — UMECLIDINIUM-VILANTEROL 62.5-25 MCG/INH IN AEPB: 1.0000 | INHALATION_SPRAY | Freq: Every day | RESPIRATORY_TRACT | Status: DC

## 2015-09-08 NOTE — Assessment & Plan Note (Addendum)
  Quit smoking 04/2014 spirometry 01/07/2015  FEV1 0.81 (34%)  ratio 66  Cont anoro

## 2015-09-08 NOTE — Progress Notes (Signed)
Subjective:    Patient ID: Tamara Silva, female    DOB: [birth year], 55 y.o.   MRN: [id number]  HPI Pt is here for f/u on her OSA. Since last seen at office, she has received her cpap machine. She uses it. More energy. Less sleepiness. Feels benefit of cpap. No issues with cpap.  Has not been admitted nor has been on abx since last seen.  Recent cold x 2days. (-) fevers, chills.    Review of Systems  Constitutional: Negative for fever, chills and fatigue.  HENT: Positive for congestion, postnasal drip and sneezing.   Respiratory: Positive for shortness of breath. Negative for apnea, cough and choking.   Cardiovascular: Negative for chest pain.  Endocrine: Negative.   Genitourinary: Negative.   Musculoskeletal: Negative.   Allergic/Immunologic: Negative.   Neurological: Negative.   Hematological: Negative.   Psychiatric/Behavioral: Negative.   All other systems reviewed and are negative.      Objective:   Physical Exam  Vitals:  Filed Vitals:   09/08/15 1509  BP: 118/62  Pulse: 70  Height: 5' 7.5" (1.715 m)  Weight: 266 lb (120.657 kg)  SpO2: 89%    Constitutional/General:  Pleasant, well-nourished, well-developed, not in any distress,  Comfortably seating.  Well kempt  Body mass index is 41.02 kg/(m^2). Wt Readings from Last 3 Encounters:  09/08/15 266 lb (120.657 kg)  08/12/15 270 lb (122.471 kg)  07/10/15 269 lb (122.018 kg)    Neck circumference:   HEENT: Pupils equal and reactive to light and accommodation. Anicteric sclerae. Normal nasal mucosa.   No oral  lesions,  mouth clear,  oropharynx clear, no postnasal drip. (-) Oral thrush. No dental caries.  Airway - Mallampati class III  Neck: No masses. Midline trachea. No JVD, (-) LAD. (-) bruits appreciated.  Respiratory/Chest: Grossly normal chest. (-) deformity. (-) Accessory muscle use.  Symmetric expansion. (-) Tenderness on palpation.  Resonant on percussion.  Diminished BS on both lower lung  zones. (-) wheezing, crackles, rhonchi (-) egophony  Cardiovascular: Regular rate and  rhythm, heart sounds normal, no murmur or gallops, no peripheral edema  Gastrointestinal:  Normal bowel sounds. Soft, non-tender. No hepatosplenomegaly.  (-) masses.   Musculoskeletal:  Normal muscle tone. Normal gait.   Extremities: Grossly normal. (-) clubbing, cyanosis.  (-) edema  Skin: (-) rash,lesions seen.   Neurological/Psychiatric : alert, oriented to time, place, person. Normal mood and affect            Assessment & Plan:  COPD exacerbation ([place])    Hypoxemia Patient is on oxygen, 2 L 24 7. DME is [employer]. She got it when she was admitted early 2017 for CHF exacerbation.  COPD GOLD III   Quit smoking 04/2014 spirometry 01/07/2015  FEV1 0.81 (34%)  ratio 66  Cont anoro   OSA (obstructive sleep apnea) Started on auto CPAP 5-15 cm water (Jul 10, 2015). She received her cpap in mid march 2017. Feels better using CPAP, more energy, less sleepiness. Pt  with CHF, EF of 45%. No download today. Plan : 1. Continue auto CPAP 5-15 cm water. Feels benefit of CPAP machine. Patient has CHF EF 45%. On chronic oxygen 2 L 24/7. 2. Received machine 2 weeks ago. 3. Will need 1 month download.  CHF (congestive heart failure) (HCC) EF 40-45% (07/2014) Cont cardiac meds. Cont o2 2L 24/7. Cont cpap   Return to clinic in 6 mos  J. Shirl Harris, MD 09/08/2015, 3:46 PM [facility]  Pager [phone] After 3 pm or if no answer, call [phone]

## 2015-09-08 NOTE — Assessment & Plan Note (Signed)
EF 40-45% (07/2014) Cont cardiac meds. Cont o2 2L 24/7. Cont cpap

## 2015-09-08 NOTE — Patient Instructions (Signed)
1. Continue using her CPAP. 2. We will get a download from her machine.   Return to clinic in 6MOS.

## 2015-09-08 NOTE — Assessment & Plan Note (Signed)
Started on auto CPAP 5-15 cm water (Jul 10, 2015). She received her cpap in mid march 2017. Feels better using CPAP, more energy, less sleepiness. Pt  with CHF, EF of 45%. No download today. Plan : 1. Continue auto CPAP 5-15 cm water. Feels benefit of CPAP machine. Patient has CHF EF 45%. On chronic oxygen 2 L 24/7. 2. Received machine 2 weeks ago. 3. Will need 1 month download.

## 2015-09-08 NOTE — Assessment & Plan Note (Signed)
Patient is on oxygen, 2 L 24 7. DME is [employer]. She got it when she was admitted early 2017 for CHF exacerbation.

## 2015-09-08 NOTE — Addendum Note (Signed)
Addended by: Beckie Busing on: 09/08/2015 04:18 PM   Modules accepted: Orders

## 2015-09-15 ENCOUNTER — Other Ambulatory Visit: Payer: Self-pay | Admitting: *Deleted

## 2015-09-15 MED ORDER — NITROGLYCERIN 0.4 MG SL SUBL: 0.4000 mg | SUBLINGUAL_TABLET | SUBLINGUAL | Status: DC | PRN

## 2015-09-18 ENCOUNTER — Telehealth: Payer: Self-pay | Admitting: *Deleted

## 2015-09-18 NOTE — Telephone Encounter (Signed)
Anoro approved thru [place] [employer] until 09/12/16.  [phone]  Pharmacy informed.

## 2015-09-19 ENCOUNTER — Other Ambulatory Visit: Payer: Self-pay | Admitting: Family Medicine

## 2015-09-19 ENCOUNTER — Other Ambulatory Visit: Payer: Self-pay | Admitting: Internal Medicine

## 2015-09-22 ENCOUNTER — Telehealth: Payer: Self-pay | Admitting: Pulmonary Disease

## 2015-09-22 DIAGNOSIS — G4733 Obstructive sleep apnea (adult) (pediatric): Secondary | ICD-10-CM

## 2015-09-22 NOTE — Telephone Encounter (Signed)
   Pts DL x first month  3%, AHI 0.3.  Pt needs to use cpap at least 4 hrs for the next 30 days, otherwise, insurance will not pay for machine.  pls let pt know. Also, will need a 1 month DL again after you talk to pt.  Thanks  AD

## 2015-09-22 NOTE — Telephone Encounter (Signed)
Spoke with pt and advised of need to increase CPAP usage. Pt voiced understanding. Pt had no explanation as to why her usage is so minimal. DL ordered for 1 month from today. Nothing further needed.

## 2015-10-06 ENCOUNTER — Encounter: Payer: Self-pay | Admitting: Pulmonary Disease

## 2015-10-16 ENCOUNTER — Other Ambulatory Visit (HOSPITAL_COMMUNITY): Payer: Self-pay | Admitting: *Deleted

## 2015-10-16 MED ORDER — NITROGLYCERIN 0.4 MG SL SUBL: 0.4000 mg | SUBLINGUAL_TABLET | SUBLINGUAL | Status: DC | PRN

## 2015-10-17 ENCOUNTER — Other Ambulatory Visit: Payer: Self-pay | Admitting: Internal Medicine

## 2015-10-20 ENCOUNTER — Other Ambulatory Visit: Payer: Self-pay | Admitting: *Deleted

## 2015-10-20 NOTE — Telephone Encounter (Signed)
Refill error

## 2015-11-11 ENCOUNTER — Other Ambulatory Visit: Payer: Self-pay | Admitting: Internal Medicine

## 2015-11-18 ENCOUNTER — Other Ambulatory Visit (HOSPITAL_COMMUNITY): Payer: Self-pay | Admitting: Internal Medicine

## 2015-11-18 ENCOUNTER — Other Ambulatory Visit: Payer: Self-pay | Admitting: Internal Medicine

## 2015-11-19 ENCOUNTER — Other Ambulatory Visit (HOSPITAL_COMMUNITY): Payer: Self-pay | Admitting: *Deleted

## 2015-12-16 ENCOUNTER — Other Ambulatory Visit: Payer: Self-pay | Admitting: Family Medicine

## 2015-12-16 ENCOUNTER — Other Ambulatory Visit: Payer: Self-pay | Admitting: Internal Medicine

## 2015-12-17 ENCOUNTER — Other Ambulatory Visit (HOSPITAL_COMMUNITY): Payer: Self-pay | Admitting: *Deleted

## 2015-12-17 MED ORDER — NITROGLYCERIN 0.4 MG SL SUBL: 0.4000 mg | SUBLINGUAL_TABLET | SUBLINGUAL | Status: DC | PRN

## 2015-12-17 NOTE — Telephone Encounter (Signed)
Refill request for Coreg

## 2015-12-17 NOTE — Telephone Encounter (Signed)
Refill ipratropium-albuterol

## 2016-01-13 ENCOUNTER — Other Ambulatory Visit: Payer: Self-pay | Admitting: Internal Medicine

## 2016-01-14 ENCOUNTER — Other Ambulatory Visit (HOSPITAL_COMMUNITY): Payer: Self-pay | Admitting: *Deleted

## 2016-01-14 ENCOUNTER — Other Ambulatory Visit: Payer: Self-pay

## 2016-01-14 MED ORDER — UMECLIDINIUM-VILANTEROL 62.5-25 MCG/INH IN AEPB: 1.0000 | INHALATION_SPRAY | Freq: Every day | RESPIRATORY_TRACT | 5 refills | Status: DC

## 2016-01-14 MED ORDER — NITROGLYCERIN 0.4 MG SL SUBL: 0.4000 mg | SUBLINGUAL_TABLET | SUBLINGUAL | 0 refills | Status: DC | PRN

## 2016-02-10 ENCOUNTER — Other Ambulatory Visit: Payer: Self-pay | Admitting: Internal Medicine

## 2016-03-03 ENCOUNTER — Other Ambulatory Visit: Payer: Self-pay | Admitting: Internal Medicine

## 2016-03-09 ENCOUNTER — Other Ambulatory Visit (HOSPITAL_COMMUNITY): Payer: Self-pay | Admitting: Internal Medicine

## 2016-03-09 ENCOUNTER — Other Ambulatory Visit: Payer: Self-pay | Admitting: Internal Medicine

## 2016-03-15 ENCOUNTER — Other Ambulatory Visit: Payer: Self-pay | Admitting: Internal Medicine

## 2016-03-15 DIAGNOSIS — Z1231 Encounter for screening mammogram for malignant neoplasm of breast: Secondary | ICD-10-CM

## 2016-03-24 ENCOUNTER — Ambulatory Visit
Admission: RE | Admit: 2016-03-24 | Discharge: 2016-03-24 | Disposition: A | Discharge status: Home / Self Care | Payer: Medicaid Other | Source: Ambulatory Visit | Attending: Internal Medicine | Admitting: Internal Medicine

## 2016-03-24 DIAGNOSIS — Z1231 Encounter for screening mammogram for malignant neoplasm of breast: Secondary | ICD-10-CM

## 2016-03-26 ENCOUNTER — Telehealth: Payer: Self-pay | Admitting: *Deleted

## 2016-03-26 NOTE — Telephone Encounter (Signed)
Medical Assistant left message on patient's home and cell voicemail. Voicemail states to give a call back to Khalfani Weideman with [facility] at [phone].  

## 2016-03-26 NOTE — Telephone Encounter (Signed)
-----   Message from Tresa Garter, MD sent at 03/25/2016  2:31 PM EDT ----- Please inform patient that her screening mammogram shows no evidence of malignancy. Recommend screening mammogram in one year

## 2016-03-31 ENCOUNTER — Other Ambulatory Visit (HOSPITAL_COMMUNITY): Payer: Self-pay | Admitting: *Deleted

## 2016-03-31 MED ORDER — NITROGLYCERIN 0.4 MG SL SUBL: 0.4000 mg | SUBLINGUAL_TABLET | SUBLINGUAL | 0 refills | Status: DC | PRN

## 2016-04-06 ENCOUNTER — Other Ambulatory Visit: Payer: Self-pay | Admitting: Internal Medicine

## 2016-04-07 ENCOUNTER — Other Ambulatory Visit (HOSPITAL_COMMUNITY): Payer: Self-pay | Admitting: *Deleted

## 2016-04-15 ENCOUNTER — Ambulatory Visit: Payer: Medicaid Other | Admitting: Internal Medicine

## 2016-04-29 ENCOUNTER — Ambulatory Visit: Payer: Medicaid Other | Attending: Internal Medicine | Admitting: Internal Medicine

## 2016-04-29 ENCOUNTER — Encounter: Payer: Self-pay | Admitting: Internal Medicine

## 2016-04-29 VITALS — BP 125/89 | HR 72 | Temp 98.6°F | Resp 18 | Ht 67.0 in | Wt 254.8 lb

## 2016-04-29 DIAGNOSIS — E1165 Type 2 diabetes mellitus with hyperglycemia: Secondary | ICD-10-CM

## 2016-04-29 DIAGNOSIS — Z7982 Long term (current) use of aspirin: Secondary | ICD-10-CM | POA: Insufficient documentation

## 2016-04-29 DIAGNOSIS — Z79899 Other long term (current) drug therapy: Secondary | ICD-10-CM | POA: Insufficient documentation

## 2016-04-29 DIAGNOSIS — I10 Essential (primary) hypertension: Secondary | ICD-10-CM | POA: Diagnosis not present

## 2016-04-29 DIAGNOSIS — Z794 Long term (current) use of insulin: Secondary | ICD-10-CM | POA: Diagnosis not present

## 2016-04-29 DIAGNOSIS — I509 Heart failure, unspecified: Secondary | ICD-10-CM | POA: Diagnosis not present

## 2016-04-29 DIAGNOSIS — E785 Hyperlipidemia, unspecified: Secondary | ICD-10-CM | POA: Insufficient documentation

## 2016-04-29 DIAGNOSIS — J449 Chronic obstructive pulmonary disease, unspecified: Secondary | ICD-10-CM | POA: Diagnosis present

## 2016-04-29 DIAGNOSIS — I110 Hypertensive heart disease with heart failure: Secondary | ICD-10-CM | POA: Diagnosis not present

## 2016-04-29 DIAGNOSIS — F319 Bipolar disorder, unspecified: Secondary | ICD-10-CM | POA: Diagnosis not present

## 2016-04-29 DIAGNOSIS — G4733 Obstructive sleep apnea (adult) (pediatric): Secondary | ICD-10-CM | POA: Diagnosis not present

## 2016-04-29 DIAGNOSIS — F5101 Primary insomnia: Secondary | ICD-10-CM | POA: Insufficient documentation

## 2016-04-29 DIAGNOSIS — IMO0001 Reserved for inherently not codable concepts without codable children: Secondary | ICD-10-CM

## 2016-04-29 LAB — GLUCOSE, POCT (MANUAL RESULT ENTRY): POC GLUCOSE: 133 mg/dL — AB (ref 70–99)

## 2016-04-29 LAB — POCT GLYCOSYLATED HEMOGLOBIN (HGB A1C): HEMOGLOBIN A1C: 9

## 2016-04-29 MED ORDER — RAMELTEON 8 MG PO TABS: 8.0000 mg | ORAL_TABLET | Freq: Every day | ORAL | 3 refills | Status: DC

## 2016-04-29 NOTE — Patient Instructions (Addendum)
Insomnia Insomnia is a sleep disorder that makes it difficult to fall asleep or to stay asleep. Insomnia can cause tiredness (fatigue), low energy, difficulty concentrating, mood swings, and poor performance at work or school. There are three different ways to classify insomnia:  Difficulty falling asleep.  Difficulty staying asleep.  Waking up too early in the morning. Any type of insomnia can be long-term (chronic) or short-term (acute). Both are common. Short-term insomnia usually lasts for three months or less. Chronic insomnia occurs at least three times a week for longer than three months. What are the causes? Insomnia may be caused by another condition, situation, or substance, such as:  Anxiety.  Certain medicines.  Gastroesophageal reflux disease (GERD) or other gastrointestinal conditions.  Asthma or other breathing conditions.  Restless legs syndrome, sleep apnea, or other sleep disorders.  Chronic pain.  Menopause. This may include hot flashes.  Stroke.  Abuse of alcohol, tobacco, or illegal drugs.  Depression.  Caffeine.  Neurological disorders, such as Alzheimer disease.  An overactive thyroid (hyperthyroidism). The cause of insomnia may not be known. What increases the risk? Risk factors for insomnia include:  Gender. Women are more commonly affected than men.  Age. Insomnia is more common as you get older.  Stress. This may involve your professional or personal life.  Income. Insomnia is more common in people with lower income.  Lack of exercise.  Irregular work schedule or night shifts.  Traveling between different time zones. What are the signs or symptoms? If you have insomnia, trouble falling asleep or trouble staying asleep is the main symptom. This may lead to other symptoms, such as:  Feeling fatigued.  Feeling nervous about going to sleep.  Not feeling rested in the morning.  Having trouble concentrating.  Feeling irritable,  anxious, or depressed. How is this treated? Treatment for insomnia depends on the cause. If your insomnia is caused by an underlying condition, treatment will focus on addressing the condition. Treatment may also include:  Medicines to help you sleep.  Counseling or therapy.  Lifestyle adjustments. Follow these instructions at home:  Take medicines only as directed by your health care provider.  Keep regular sleeping and waking hours. Avoid naps.  Keep a sleep diary to help you and your health care provider figure out what could be causing your insomnia. Include:  When you sleep.  When you wake up during the night.  How well you sleep.  How rested you feel the next day.  Any side effects of medicines you are taking.  What you eat and drink.  Make your bedroom a comfortable place where it is easy to fall asleep:  Put up shades or special blackout curtains to block light from outside.  Use a white noise machine to block noise.  Keep the temperature cool.  Exercise regularly as directed by your health care provider. Avoid exercising right before bedtime.  Use relaxation techniques to manage stress. Ask your health care provider to suggest some techniques that may work well for you. These may include:  Breathing exercises.  Routines to release muscle tension.  Visualizing peaceful scenes.  Cut back on alcohol, caffeinated beverages, and cigarettes, especially close to bedtime. These can disrupt your sleep.  Do not overeat or eat spicy foods right before bedtime. This can lead to digestive discomfort that can make it hard for you to sleep.  Limit screen use before bedtime. This includes:  Watching TV.  Using your smartphone, tablet, and computer.  Stick to a   routine. This can help you fall asleep faster. Try to do a quiet activity, brush your teeth, and go to bed at the same time each night.  Get out of bed if you are still awake after 15 minutes of trying to  sleep. Keep the lights down, but try reading or doing a quiet activity. When you feel sleepy, go back to bed.  Make sure that you drive carefully. Avoid driving if you feel very sleepy.  Keep all follow-up appointments as directed by your health care provider. This is important. Contact a health care provider if:  You are tired throughout the day or have trouble in your daily routine due to sleepiness.  You continue to have sleep problems or your sleep problems get worse. Get help right away if:  You have serious thoughts about hurting yourself or someone else. This information is not intended to replace advice given to you by your health care provider. Make sure you discuss any questions you have with your health care provider. Document Released: 05/14/2000 Document Revised: 10/17/2015 Document Reviewed: 02/15/2014 Elsevier Interactive Patient Education  2017 [employer]  Chronic Obstructive Pulmonary Disease Chronic obstructive pulmonary disease (COPD) is a common lung problem. In COPD, the flow of air from the lungs is limited. The way your lungs work will probably never return to normal, but there are things you can do to improve your lungs and make yourself feel better. Your doctor may treat your condition with:  Medicines.  Oxygen.  Lung surgery.  Changes to your diet.  Rehabilitation. This may involve a team of specialists. Follow these instructions at home:  Take all medicines as told by your doctor.  Avoid medicines or cough syrups that dry up your airway (such as antihistamines) and do not allow you to get rid of thick spit. You do not need to avoid them if told differently by your doctor.  If you smoke, stop. Smoking makes the problem worse.  Avoid being around things that make your breathing worse (like smoke, chemicals, and fumes).  Use oxygen therapy and therapy to help improve your lungs (pulmonary rehabilitation) if told by your doctor. If you need home  oxygen therapy, ask your doctor if you should buy a tool to measure your oxygen level (oximeter).  Avoid people who have a sickness you can catch (contagious).  Avoid going outside when it is very hot, cold, or humid.  Eat healthy foods. Eat smaller meals more often. Rest before meals.  Stay active, but remember to also rest.  Make sure to get all the shots (vaccines) your doctor recommends. Ask your doctor if you need a pneumonia shot.  Learn and use tips on how to relax.  Learn and use tips on how to control your breathing as told by your doctor. Try: 1. Breathing in (inhaling) through your nose for 1 second. Then, pucker your lips and breath out (exhale) through your lips for 2 seconds. 2. Putting one hand on your belly (abdomen). Breathe in slowly through your nose for 1 second. Your hand on your belly should move out. Pucker your lips and breathe out slowly through your lips. Your hand on your belly should move in as you breathe out.  Learn and use controlled coughing to clear thick spit from your lungs. The steps are: 1. Lean your head a little forward. 2. Breathe in deeply. 3. Try to hold your breath for 3 seconds. 4. Keep your mouth slightly open while coughing 2 times. 5. Spit any  thick spit out into a tissue. 6. Rest and do the steps again 1 or 2 times as needed. Contact a doctor if:  You cough up more thick spit than usual.  There is a change in the color or thickness of the spit.  It is harder to breathe than usual.  Your breathing is faster than usual. Get help right away if:  You have shortness of breath while resting.  You have shortness of breath that stops you from:  Being able to talk.  Doing normal activities.  You chest hurts for longer than 5 minutes.  Your skin color is more blue than usual.  Your pulse oximeter shows that you have low oxygen for longer than 5 minutes. This information is not intended to replace advice given to you by your health  care provider. Make sure you discuss any questions you have with your health care provider. Document Released: 11/03/2007 Document Revised: 10/23/2015 Document Reviewed: 01/11/2013 Elsevier Interactive Patient Education  2017 [employer].

## 2016-04-29 NOTE — Progress Notes (Signed)
Tamara Silva, is a 55 y.o. female  MAU:[id number]  BWL:[id number]  DOB - [birth year]  Chief Complaint  Patient presents with  . COPD      Subjective:   Tamara Silva is a 55 y.o. female with a history of type 2 diabetes mellitus (A1c 7.9), essential hypertension, CHF, bipolar disorder, obstructive sleep apnea, COPD (on 2 L oxygen) here today for a follow up visit for COPD. Patient is complaining of insomnia for the past 2 months, difficult to initiate sleep and when she does, she wakes up within 3 hours. Patient uses CPAP machine for OSA. Blood sugar is sub optimal in control. BP is controlled. Patient has No headache, No chest pain, No abdominal pain - No Nausea, No new weakness tingling or numbness.  Problem  Dyslipidemia  Primary Insomnia    ALLERGIES: Allergies  Allergen Reactions  . Metolazone Other (See Comments)    Dizziness and falling  . Plavix [Clopidogrel Bisulfate] Other (See Comments)    High PRU's - non-responder  . Nsaids Other (See Comments)    "I do not take NSAIDs d/t interfering with other meds"    PAST MEDICAL HISTORY: Past Medical History:  Diagnosis Date  . Asthma   . Diabetes mellitus without complication ([place])    MEDICATIONS AT HOME: Prior to Admission medications   Medication Sig Start Date End Date Taking? Authorizing Provider  ACCU-CHEK AVIVA PLUS test strip USE 1 STRIP TO CHECK GLUCOSE 3 TIMES DAILY 01/14/16  Yes Tresa Garter, MD  ACCU-CHEK FASTCLIX LANCETS MISC 1 applicator by Does not apply route 4 (four) times daily -  before meals and at bedtime. 08/07/15  Yes Arnoldo Morale, MD  ACCU-CHEK SOFTCLIX LANCETS lancets USE 1 LANCET TO CHECK GLUCOSE 3 TIMES DAILY 01/14/16  Yes Tresa Garter, MD  aspirin 81 MG tablet Take 1 tablet (81 mg total) by mouth daily. 10/09/13  Yes Rhonda G Barrett, PA-C  Blood Glucose Monitoring Suppl (ACCU-CHEK AVIVA) device Use as instructed 3 times daily before meals. 08/06/15  Yes Arnoldo Morale, MD  carvedilol  (COREG) 12.5 MG tablet TAKE 1 TABLET BY MOUTH 2 TIMES A DAY WITH A MEAL 04/07/16  Yes Tresa Garter, MD  cycloSPORINE (RESTASIS) 0.05 % ophthalmic emulsion Place 2 drops into both eyes 2 (two) times daily as needed (for dry eyes).    Yes Historical Provider, MD  EASY TOUCH PEN NEEDLES 31G X 8 MM MISC USE AS DIRECTED TO INJECT INSULIN 01/14/16  Yes Rinaldo Macqueen Essie Christine, MD  EFFIENT 10 MG TABS tablet TAKE ONE TABLET BY MOUTH DAILY. 03/18/15  Yes Jolaine Artist, MD  EFFIENT 10 MG TABS tablet TAKE ONE TABLET BY MOUTH DAILY. 11/19/15  Yes Shaune Pascal Bensimhon, MD  fluticasone (FLONASE) 50 MCG/ACT nasal spray USE 2 SPRAYS IN EACH NOSTRIL TWICE DAILY 04/07/16  Yes Zanylah Hardie E Doreene Burke, MD  furosemide (LASIX) 40 MG tablet TAKE 2 TABLETS BY MOUTH TWICE DAILY 04/07/16  Yes Tresa Garter, MD  INSULIN SYRINGE .5CC/29G 29G X 1/2" 0.5 ML MISC Use as instructed by physician 03/28/14  Yes Tresa Garter, MD  ipratropium-albuterol (DUONEB) 0.5-2.5 (3) MG/3ML SOLN INHALE THE CONTENTS OF 1 VIAL VIA NEBULIZER EVERY 6 HOURS AS NEEDED 04/07/16  Yes Sadiq Mccauley E Karianna Gusman, MD  LEVEMIR 100 UNIT/ML injection INJECT 45 UNITS SUBCUTANEOUSLY EVERY NIGHT AT BEDTIME *APPOINTMENT REQUIRED FOR FURTHER REFILLS* 04/07/16  Yes Tresa Garter, MD  nitroGLYCERIN (NITROSTAT) 0.4 MG SL tablet Place 1 tablet (0.4 mg total) under the tongue  every 5 (five) minutes as needed for chest pain. NEED OV. 03/31/16  Yes Shaune Pascal Bensimhon, MD  NOVOLOG FLEXPEN 100 UNIT/ML FlexPen INJECT 15-20 UNITS SUBCUTANEOUSLY 3 TIMES A DAY WITH MEALS 04/07/16  Yes Tresa Garter, MD  omeprazole (PRILOSEC) 40 MG capsule TAKE 1 CAPSULE BY MOUTH TWICE DAILY 04/07/16  Yes Tresa Garter, MD  ondansetron (ZOFRAN ODT) 8 MG disintegrating tablet Take 1 tablet (8 mg total) by mouth every 8 (eight) hours as needed for nausea or vomiting. 02/21/15  Yes Jola Schmidt, MD  Paliperidone Palmitate (INVEGA SUSTENNA) 117 MG/0.75ML SUSP Inject 156 mg into the  muscle every 3 (three) months.    Yes Historical Provider, MD  pravastatin (PRAVACHOL) 80 MG tablet Take 1 tablet (80 mg total) by mouth every evening. 05/26/15  Yes Robbie Lis, MD  prazosin (MINIPRESS) 5 MG capsule TAKE 1 CAPSULE BY MOUTH EVERY NIGHT AT BEDTIME FOR NIGHTMARES 04/07/16  Yes Tresa Garter, MD  PROAIR HFA 108 (90 Base) MCG/ACT inhaler INHALE 2 PUFFS BY MOUTH 4 TIMES DAILY AS NEEDED. 04/07/16  Yes Tresa Garter, MD  spironolactone (ALDACTONE) 25 MG tablet Take 1 tablet (25 mg total) by mouth daily. 10/31/14  Yes Tresa Garter, MD  spironolactone (ALDACTONE) 25 MG tablet TAKE 1 TABLET BY MOUTH EVERY DAY *NEW DOSAGE 03/10/16  Yes Shaune Pascal Bensimhon, MD  umeclidinium-vilanterol (ANORO ELLIPTA) 62.5-25 MCG/INH AEPB Inhale 1 puff into the lungs daily. 01/14/16  Yes Jose Angelo A Corrie Dandy, MD  ramelteon (ROZEREM) 8 MG tablet Take 1 tablet (8 mg total) by mouth at bedtime. 04/29/16   Tresa Garter, MD   Objective:   Vitals:   04/29/16 1523  BP: 125/89  Pulse: 72  Resp: 18  Temp: 98.6 F (37 C)  TempSrc: Oral  SpO2: 96%  Weight: 254 lb 12.8 oz (115.6 kg)  Height: 5\' 7"  (1.702 m)   Exam General appearance : Awake, alert, not in any distress. Speech Clear. Not toxic looking HEENT: Atraumatic and Normocephalic, pupils equally reactive to light and accomodation Neck: Supple, no JVD. No cervical lymphadenopathy.  Chest: Good air entry bilaterally, no added sounds  CVS: S1 S2 regular, no murmurs.  Abdomen: Bowel sounds present, Non tender and not distended with no gaurding, rigidity or rebound. Extremities: B/L Lower Ext shows no edema, both legs are warm to touch Neurology: Awake alert, and oriented X 3, CN II-XII intact, Non focal Skin: No Rash  Data Review Lab Results  Component Value Date   HGBA1C 9.0 04/29/2016   HGBA1C 7.9 (H) 05/21/2015   HGBA1C 7.50 10/31/2014    Assessment & Plan   1. Primary insomnia  - ramelteon (ROZEREM) 8 MG tablet; Take  1 tablet (8 mg total) by mouth at bedtime.  Dispense: 30 tablet; Refill: 3  2. Essential hypertension  We have discussed target BP range and blood pressure goal. I have advised patient to check BP regularly and to call us back or report to clinic if the numbers are consistently higher than 140/90. We discussed the importance of compliance with medical therapy and DASH diet recommended, consequences of uncontrolled hypertension discussed.  - continue current BP medications  3. COPD GOLD III   - Continue current management and follow up with pulmonologist.  4. Uncontrolled type 2 diabetes mellitus without complication, with long-term current use of insulin (HCC)  - Microalbumin/Creatinine Ratio, Urine - Glucose (CBG) - POCT A1C  5. Dyslipidemia  To address this please limit saturated fat to  no more than 7% of your calories, limit cholesterol to 200 mg/day, increase fiber and exercise as tolerated. If needed we may add another cholesterol lowering medication to your regimen.   Patient have been counseled extensively about nutrition and exercise. Other issues discussed during this visit include: low cholesterol diet, weight control and daily exercise, foot care, annual eye examinations at Ophthalmology, importance of adherence with medications and regular follow-up. We also discussed long term complications of uncontrolled diabetes and hypertension.   Return in about 3 months (around 07/28/2016) for COPD, Insomnia, Follow up HTN.  The patient was given clear instructions to go to ER or return to medical center if symptoms don't improve, worsen or new problems develop. The patient verbalized understanding. The patient was told to call to get lab results if they haven't heard anything in the next week.   This note has been created with Surveyor, quantity. Any transcriptional errors are unintentional.    Angelica Chessman, MD, [place], [place], [place],  [place] and [place], [place]   04/29/2016, 3:52 PM

## 2016-04-29 NOTE — Progress Notes (Signed)
Patient is here COPD  Patient complains of insomnia for the past 2 months.  Patient denies pain at this time.  Patient has taken medication today. Patient has eaten today.

## 2016-04-30 LAB — MICROALBUMIN / CREATININE URINE RATIO
CREATININE, URINE: 21 mg/dL (ref 20–320)
MICROALB/CREAT RATIO: 67 ug/mg{creat} — AB (ref <30)
Microalb, Ur: 1.4 mg/dL

## 2016-05-04 ENCOUNTER — Other Ambulatory Visit: Payer: Self-pay | Admitting: Internal Medicine

## 2016-05-06 ENCOUNTER — Other Ambulatory Visit (HOSPITAL_COMMUNITY): Payer: Self-pay | Admitting: *Deleted

## 2016-06-01 ENCOUNTER — Other Ambulatory Visit: Payer: Self-pay | Admitting: Internal Medicine

## 2016-06-10 ENCOUNTER — Inpatient Hospital Stay (HOSPITAL_COMMUNITY): Admission: RE | Admit: 2016-06-10 | Payer: Self-pay | Source: Ambulatory Visit

## 2016-07-06 ENCOUNTER — Other Ambulatory Visit: Payer: Self-pay | Admitting: Internal Medicine

## 2016-07-08 ENCOUNTER — Other Ambulatory Visit (HOSPITAL_COMMUNITY): Payer: Self-pay | Admitting: *Deleted

## 2016-07-09 ENCOUNTER — Encounter (HOSPITAL_COMMUNITY): Payer: Self-pay

## 2016-07-13 ENCOUNTER — Other Ambulatory Visit: Payer: Self-pay

## 2016-07-13 MED ORDER — UMECLIDINIUM-VILANTEROL 62.5-25 MCG/INH IN AEPB: 1.0000 | INHALATION_SPRAY | Freq: Every day | RESPIRATORY_TRACT | 5 refills | Status: DC

## 2016-07-21 ENCOUNTER — Ambulatory Visit (HOSPITAL_COMMUNITY)
Admission: RE | Admit: 2016-07-21 | Discharge: 2016-07-21 | Disposition: A | Discharge status: Home / Self Care | Payer: Medicaid Other | Source: Ambulatory Visit | Attending: Cardiology | Admitting: Cardiology

## 2016-07-21 ENCOUNTER — Encounter (HOSPITAL_COMMUNITY): Payer: Self-pay

## 2016-07-21 VITALS — BP 133/66 | HR 79 | Wt 255.2 lb

## 2016-07-21 DIAGNOSIS — E1169 Type 2 diabetes mellitus with other specified complication: Secondary | ICD-10-CM

## 2016-07-21 DIAGNOSIS — R9431 Abnormal electrocardiogram [ECG] [EKG]: Secondary | ICD-10-CM | POA: Insufficient documentation

## 2016-07-21 DIAGNOSIS — E785 Hyperlipidemia, unspecified: Secondary | ICD-10-CM | POA: Diagnosis not present

## 2016-07-21 DIAGNOSIS — I2510 Atherosclerotic heart disease of native coronary artery without angina pectoris: Secondary | ICD-10-CM | POA: Diagnosis not present

## 2016-07-21 DIAGNOSIS — Z9861 Coronary angioplasty status: Secondary | ICD-10-CM | POA: Diagnosis not present

## 2016-07-21 DIAGNOSIS — I5042 Chronic combined systolic (congestive) and diastolic (congestive) heart failure: Secondary | ICD-10-CM | POA: Diagnosis not present

## 2016-07-21 MED ORDER — ROSUVASTATIN CALCIUM 10 MG PO TABS: 10.0000 mg | ORAL_TABLET | Freq: Every day | ORAL | 3 refills | Status: DC

## 2016-07-21 MED ORDER — VALSARTAN 80 MG PO TABS: 80.0000 mg | ORAL_TABLET | Freq: Every day | ORAL | 3 refills | Status: DC

## 2016-07-21 NOTE — Patient Instructions (Signed)
Start Crestor 10 mg daily  Valsartan 80 mg daily  Labs in 1 week  Fasting labs in 2 months  Your physician has requested that you have an echocardiogram. Echocardiography is a painless test that uses sound waves to create images of your heart. It provides your doctor with information about the size and shape of your heart and how well your heart's chambers and valves are working. This procedure takes approximately one hour. There are no restrictions for this procedure.  We will contact you in 6 months to schedule your next appointment.

## 2016-07-22 NOTE — Progress Notes (Signed)
Patient ID: Tamara Silva, female   DOB: 1960-10-04, 56 y.o.   MRN: [id number]  Primary Physician: Dr. Doreene Burke  HPI: [name] is a 56 year old with morbid obesity, COPD on home oxygen (stopped smoking 1/15), DM2, CAD with history of PCI at [facility] in 2006 [facility] 2009 (likely to the LAD), hypertension, hyperlipidemia, H/O chronic LLL PE 2014 and chronic systolic HF.  Admitted Oct 06, 2013 with STEMI of inferior wall and had Xience Alpine DES 2.75 mm x 15 mm to the RCA. Initially on plavix but P2Y12 test showed poor response and she was switched to Effient. Hospital course was complicated by bradycardia. Carvedilol was cut back due to bradycardia.   She has not been seen in this office for two years.  She seems to be stable.  She has COPD and has oxygen for use with exertion.  She is short of breath after walking about a block.  No chest pain.  No orthopnea/PND.  She is not on a statin, she is not sure why not.  She is taking Lasix only rarely (prn).   She stopped lisinopril due to cough.   ECG: NSR, old anterior MI, old inferior MI, nonspecific T wave inversions.   Studies  Cardiolite 08/29/2013: Extensive LAD distribution scar with severe systolic dysfunction. No reversible ischemia. EF roughly 28%  LHC 10/07/13 With severe single-vessel disease with distal RCA 99% thrombotic subtotal occlusion, successful PCI of the distal RCA with a Xience Alpine 2.75 mm x 43mm postdilated to 3.1 mm Mild in-stent restenosis in the LAD stent. Dilated Left Ventricle with global hypokinesis and EF of roughly 30%.  ECHO 10/07/13: EF 35-40%, grade II DD, mild MR Echo 2/16: EF 40-45%, apical hypokinesis  Labs (10/15): K 4.4, creatinine 0.92, BNP 165 Labs (2/17): K 4.2, creatinine 0.89  ROS: All systems negative except as listed in HPI, PMH and Problem List.  SH:  Social History   Social History  . Marital status: Single    Spouse name: N/A  . Number of children: 1  . Years of education: N/A    Occupational History  . Not on file.   Social History Main Topics  . Smoking status: Former Smoker    Packs/day: 0.50    Years: 20.00    Types: Cigarettes    Quit date: 04/30/2014  . Smokeless tobacco: Never Used     Comment: smoked off and on x 20 years  . Alcohol use No  . Drug use: No  . Sexual activity: No   Other Topics Concern  . Not on file   Social History Narrative   Origibnally from [place]   Most recently from [place]   Daughter lives in town      On Social security to CHF, COPD, CAD    FH:  Family History  Problem Relation Age of Onset  . Stroke Mother   . Hypertension Mother   . Colon cancer Father   . Diabetes Father   . Heart attack Father   . Breast cancer Paternal Aunt   . Emphysema Brother   . Lung disease Brother     Unknown type, 3 brothers, one with liver and lung disease  . Asthma Paternal Aunt   . Sarcoidosis Other     Past Medical History:  Diagnosis Date  . Asthma   . Diabetes mellitus without complication [facility])     Current Outpatient Prescriptions  Medication Sig Dispense Refill  . ACCU-CHEK AVIVA PLUS test strip USE  1 STRIP TO CHECK GLUCOSE 3 TIMES DAILY 100 each 10  . ACCU-CHEK FASTCLIX LANCETS MISC 1 applicator by Does not apply route 4 (four) times daily -  before meals and at bedtime. 100 each 11  . ACCU-CHEK SOFTCLIX LANCETS lancets USE 1 LANCET TO CHECK GLUCOSE 3 TIMES DAILY 100 each 10  . ALPRAZolam (XANAX) 1 MG tablet Take 1 mg by mouth at bedtime as needed for anxiety.    [name] Kitchen aspirin 81 MG tablet Take 1 tablet (81 mg total) by mouth daily.    . Blood Glucose Monitoring Suppl (ACCU-CHEK AVIVA) device Use as instructed 3 times daily before meals. 1 each 0  . carvedilol (COREG) 12.5 MG tablet TAKE 1 TABLET BY MOUTH 2 TIMES A DAY WITH A MEAL 60 tablet 3  . cycloSPORINE (RESTASIS) 0.05 % ophthalmic emulsion Place 2 drops into both eyes 2 (two) times daily as needed (for dry eyes).     [name] Kitchen EASY TOUCH PEN NEEDLES 31G X 8 MM MISC  USE AS DIRECTED TO INJECT INSULIN 100 each 10  . EFFIENT 10 MG TABS tablet TAKE ONE TABLET BY MOUTH DAILY. 30 tablet 1  . fluticasone (FLONASE) 50 MCG/ACT nasal spray USE 2 SPRAYS IN EACH NOSTRIL TWICE DAILY 16 g 3  . furosemide (LASIX) 40 MG tablet TAKE 2 TABLETS BY MOUTH TWICE DAILY 120 tablet 3  . insulin detemir (LEVEMIR) 100 UNIT/ML injection Inject 0.45 mLs (45 Units total) into the skin at bedtime. 20 mL 3  . INSULIN SYRINGE .5CC/29G 29G X 1/2" 0.5 ML MISC Use as instructed by physician 100 each 12  . ipratropium-albuterol (DUONEB) 0.5-2.5 (3) MG/3ML SOLN INHALE THE CONTENTS OF 1 VIAL VIA NEBULIZER EVERY 6 HOURS AS NEEDED 360 mL 0  . NOVOLOG FLEXPEN 100 UNIT/ML FlexPen INJECT 15-20 UNITS SUBCUTANEOUSLY 3 TIMES A DAY WITH MEALS 30 mL 3  . omeprazole (PRILOSEC) 40 MG capsule TAKE 1 CAPSULE BY MOUTH TWICE DAILY 60 capsule 3  . Paliperidone Palmitate (INVEGA SUSTENNA) 117 MG/0.75ML SUSP Inject 156 mg into the muscle every 3 (three) months.     . prazosin (MINIPRESS) 5 MG capsule TAKE 1 CAPSULE BY MOUTH EVERY NIGHT AT BEDTIME FOR NIGHTMARES 30 capsule 3  . PROAIR HFA 108 (90 Base) MCG/ACT inhaler INHALE 2 PUFFS BY MOUTH 4 TIMES DAILY AS NEEDED. 8.5 each 3  . spironolactone (ALDACTONE) 25 MG tablet Take 1 tablet (25 mg total) by mouth daily. 90 tablet 3  . umeclidinium-vilanterol (ANORO ELLIPTA) 62.5-25 MCG/INH AEPB Inhale 1 puff into the lungs daily. 1 each 5  . nitroGLYCERIN (NITROSTAT) 0.4 MG SL tablet Place 1 tablet (0.4 mg total) under the tongue every 5 (five) minutes as needed for chest pain. NEED OV. (Patient not taking: Reported on 07/21/2016) 25 tablet 0  . rosuvastatin (CRESTOR) 10 MG tablet Take 1 tablet (10 mg total) by mouth daily. 90 tablet 3  . valsartan (DIOVAN) 80 MG tablet Take 1 tablet (80 mg total) by mouth daily. 90 tablet 3   No current facility-administered medications for this encounter.     Vitals:   07/21/16 1123  BP: 133/66  Pulse: 79  SpO2: 96%  Weight: 255 lb 4  oz (115.8 kg)    PHYSICAL EXAM: General:  NAD HEENT: normal Neck: supple. JVP difficult to assess due to body habitus but does not appear elevated; Carotids 2+ bilaterally; no bruits. No lymphadenopathy or thryomegaly appreciated. Cor: PMI normal. Regular rate & rhythm. No rubs, gallops or murmurs. Lungs: wheezing throughout Abdomen:  obese, soft, nontender, nondistended. No hepatosplenomegaly. No bruits or masses. Good bowel sounds. Extremities: no cyanosis, clubbing, rash, trace bilateral edema Neuro: alert & orientedx3, cranial nerves grossly intact. Moves all 4 extremities w/o difficulty. Affect pleasant.  ASSESSMENT & PLAN:  1) Chronic systolic heart failure: Ischemic cardiomyopathy, EF 40-45% on 2/16 echo. NYHA class II symptoms.  She looks euvolemic.   - Continue current Coreg and spironolactone.   - Given ACEI cough, will start her on valsartan 80 mg daily.   - Arrange for echo to reassess LV systolic function. - She needs labs.  We discussed this today.  She is very resistant to having a blood draw.  I told her that I would not refill her meds without monitoring her K and creatinine.  She says that she will come next week for CMET, says she needs time to think about it . - QRS is narrow, she would not be CRT candidate.   2) COPD: She has quit smoking.  She is on home oxygen with exertion.  3) OSA: Using CPAP.   4) HTN:  BP controlled.  5) CAD: s/p DES (09/2013) after inferior STEMI.  No chest pain.  - She can stop Effient and start Plavix 75 mg daily. - Continue ASA 81 daily.  - She needs to be on a statin.  I will have her start Crestor 10 mg daily with lipids/LFTs in 2 months.   Followup in 6 months.   Loralie Champagne 07/22/2016

## 2016-07-28 ENCOUNTER — Other Ambulatory Visit (HOSPITAL_COMMUNITY): Payer: Self-pay

## 2016-08-03 ENCOUNTER — Other Ambulatory Visit: Payer: Self-pay | Admitting: Internal Medicine

## 2016-08-03 ENCOUNTER — Other Ambulatory Visit (HOSPITAL_COMMUNITY): Payer: Self-pay | Admitting: Cardiology

## 2016-08-03 MED ORDER — NITROGLYCERIN 0.4 MG SL SUBL: 0.4000 mg | SUBLINGUAL_TABLET | SUBLINGUAL | 11 refills | Status: DC | PRN

## 2016-08-04 ENCOUNTER — Inpatient Hospital Stay (HOSPITAL_COMMUNITY): Admission: RE | Admit: 2016-08-04 | Payer: Self-pay | Source: Ambulatory Visit

## 2016-08-30 ENCOUNTER — Other Ambulatory Visit (HOSPITAL_COMMUNITY): Payer: Self-pay | Admitting: Pharmacist

## 2016-08-30 ENCOUNTER — Other Ambulatory Visit (HOSPITAL_COMMUNITY): Payer: Self-pay

## 2016-08-30 ENCOUNTER — Telehealth (HOSPITAL_COMMUNITY): Payer: Self-pay | Admitting: Pharmacist

## 2016-08-30 NOTE — Telephone Encounter (Signed)
Effient PA approved by [place] [employer] through 08/30/17.   Ruta Hinds. Velva Harman, PharmD, BCPS, CPP Clinical Pharmacist Pager: [phone] Phone: [phone] 08/30/2016 12:34 PM

## 2016-08-31 ENCOUNTER — Other Ambulatory Visit: Payer: Self-pay | Admitting: Internal Medicine

## 2016-08-31 ENCOUNTER — Telehealth: Payer: Self-pay | Admitting: Pulmonary Disease

## 2016-08-31 NOTE — Telephone Encounter (Signed)
PA for Anoro has been approved through [employer] until 08/26/17. PA # is [id number]. The pharmacy is aware. Nothing else is needed.

## 2016-09-13 ENCOUNTER — Other Ambulatory Visit (HOSPITAL_COMMUNITY): Payer: Self-pay

## 2016-09-17 ENCOUNTER — Inpatient Hospital Stay (HOSPITAL_COMMUNITY): Admission: RE | Admit: 2016-09-17 | Payer: Self-pay | Source: Ambulatory Visit

## 2016-09-21 ENCOUNTER — Other Ambulatory Visit: Payer: Self-pay

## 2016-09-21 ENCOUNTER — Ambulatory Visit (HOSPITAL_COMMUNITY): Payer: Medicaid Other | Attending: Cardiovascular Disease

## 2016-09-21 DIAGNOSIS — R29898 Other symptoms and signs involving the musculoskeletal system: Secondary | ICD-10-CM | POA: Diagnosis not present

## 2016-09-21 DIAGNOSIS — I130 Hypertensive heart and chronic kidney disease with heart failure and stage 1 through stage 4 chronic kidney disease, or unspecified chronic kidney disease: Secondary | ICD-10-CM | POA: Diagnosis not present

## 2016-09-21 DIAGNOSIS — I2510 Atherosclerotic heart disease of native coronary artery without angina pectoris: Secondary | ICD-10-CM | POA: Insufficient documentation

## 2016-09-21 DIAGNOSIS — Z87891 Personal history of nicotine dependence: Secondary | ICD-10-CM | POA: Diagnosis not present

## 2016-09-21 DIAGNOSIS — N182 Chronic kidney disease, stage 2 (mild): Secondary | ICD-10-CM | POA: Diagnosis not present

## 2016-09-21 DIAGNOSIS — I509 Heart failure, unspecified: Secondary | ICD-10-CM | POA: Diagnosis present

## 2016-09-21 DIAGNOSIS — I5042 Chronic combined systolic (congestive) and diastolic (congestive) heart failure: Secondary | ICD-10-CM | POA: Insufficient documentation

## 2016-09-21 DIAGNOSIS — I340 Nonrheumatic mitral (valve) insufficiency: Secondary | ICD-10-CM | POA: Insufficient documentation

## 2016-09-21 MED ORDER — PERFLUTREN LIPID MICROSPHERE
1.0000 mL | INTRAVENOUS | Status: AC | PRN
  Administered 2016-09-21: 2 mL via INTRAVENOUS

## 2016-10-06 ENCOUNTER — Ambulatory Visit: Payer: Medicaid Other | Attending: Internal Medicine | Admitting: Internal Medicine

## 2016-10-06 VITALS — BP 112/68 | HR 65 | Temp 98.4°F | Resp 18 | Ht 67.5 in | Wt 245.0 lb

## 2016-10-06 DIAGNOSIS — Z7982 Long term (current) use of aspirin: Secondary | ICD-10-CM | POA: Diagnosis not present

## 2016-10-06 DIAGNOSIS — E1165 Type 2 diabetes mellitus with hyperglycemia: Secondary | ICD-10-CM | POA: Diagnosis not present

## 2016-10-06 DIAGNOSIS — Z9981 Dependence on supplemental oxygen: Secondary | ICD-10-CM | POA: Diagnosis not present

## 2016-10-06 DIAGNOSIS — E785 Hyperlipidemia, unspecified: Secondary | ICD-10-CM

## 2016-10-06 DIAGNOSIS — IMO0001 Reserved for inherently not codable concepts without codable children: Secondary | ICD-10-CM

## 2016-10-06 DIAGNOSIS — Z79899 Other long term (current) drug therapy: Secondary | ICD-10-CM | POA: Insufficient documentation

## 2016-10-06 DIAGNOSIS — R0602 Shortness of breath: Secondary | ICD-10-CM | POA: Diagnosis present

## 2016-10-06 DIAGNOSIS — I10 Essential (primary) hypertension: Secondary | ICD-10-CM

## 2016-10-06 DIAGNOSIS — Z794 Long term (current) use of insulin: Secondary | ICD-10-CM | POA: Diagnosis not present

## 2016-10-06 DIAGNOSIS — G4733 Obstructive sleep apnea (adult) (pediatric): Secondary | ICD-10-CM

## 2016-10-06 DIAGNOSIS — F319 Bipolar disorder, unspecified: Secondary | ICD-10-CM | POA: Diagnosis not present

## 2016-10-06 DIAGNOSIS — I110 Hypertensive heart disease with heart failure: Secondary | ICD-10-CM | POA: Insufficient documentation

## 2016-10-06 DIAGNOSIS — J449 Chronic obstructive pulmonary disease, unspecified: Secondary | ICD-10-CM | POA: Diagnosis not present

## 2016-10-06 DIAGNOSIS — I509 Heart failure, unspecified: Secondary | ICD-10-CM | POA: Diagnosis not present

## 2016-10-06 LAB — POCT UA - MICROALBUMIN
Creatinine, POC: 300 mg/dL
Microalbumin Ur, POC: 150 mg/L

## 2016-10-06 LAB — POCT GLYCOSYLATED HEMOGLOBIN (HGB A1C): Hemoglobin A1C: 10

## 2016-10-06 LAB — GLUCOSE, POCT (MANUAL RESULT ENTRY): POC GLUCOSE: 224 mg/dL — AB (ref 70–99)

## 2016-10-06 MED ORDER — SPIRONOLACTONE 25 MG PO TABS: 25.0000 mg | ORAL_TABLET | Freq: Every day | ORAL | 3 refills | Status: DC

## 2016-10-06 MED ORDER — OMEPRAZOLE 40 MG PO CPDR: 40.0000 mg | DELAYED_RELEASE_CAPSULE | Freq: Every day | ORAL | 3 refills | Status: DC

## 2016-10-06 NOTE — Patient Instructions (Signed)
Heart Failure Heart failure is a condition in which the heart has trouble pumping blood because it has become weak or stiff. This means that the heart does not pump blood efficiently for the body to work well. For some people with heart failure, fluid may back up into the lungs and there may be swelling (edema) in the lower legs. Heart failure is usually a long-term (chronic) condition. It is important for you to take good care of yourself and follow the treatment plan from your health care provider. What are the causes? This condition is caused by some health problems, including:  High blood pressure (hypertension). Hypertension causes the heart muscle to work harder than normal. High blood pressure eventually causes the heart to become stiff and weak.  Coronary artery disease (CAD). CAD is the buildup of cholesterol and fat (plaques) in the arteries of the heart.  Heart attack (myocardial infarction). Injured tissue, which is caused by the heart attack, does not contract as well and the heart's ability to pump blood is weakened.  Abnormal heart valves. When the heart valves do not open and close properly, the heart muscle must pump harder to keep the blood flowing.  Heart muscle disease (cardiomyopathy or myocarditis). Heart muscle disease is damage to the heart muscle from a variety of causes, such as drug or alcohol abuse, infections, or unknown causes. These can increase the risk of heart failure.  Lung disease. When the lungs do not work properly, the heart must work harder. What increases the risk? Risk of heart failure increases as a person ages. This condition is also more likely to develop in people who:  Are overweight.  Are female.  Smoke or chew tobacco.  Abuse alcohol or illegal drugs.  Have taken medicines that can damage the heart, such as chemotherapy drugs.  Have diabetes.  High blood sugar (glucose) is associated with high fat (lipid) levels in the blood.  Diabetes  can also damage tiny blood vessels that carry nutrients to the heart muscle.  Have abnormal heart rhythms.  Have thyroid problems.  Have low blood counts (anemia). What are the signs or symptoms? Symptoms of this condition include:  Shortness of breath with activity, such as when climbing stairs.  Persistent cough.  Swelling of the feet, ankles, legs, or abdomen.  Unexplained weight gain.  Difficulty breathing when lying flat (orthopnea).  Waking from sleep because of the need to sit up and get more air.  Rapid heartbeat.  Fatigue and loss of energy.  Feeling light-headed, dizzy, or close to fainting.  Loss of appetite.  Nausea.  Increased urination during the night (nocturia).  Confusion. How is this diagnosed? This condition is diagnosed based on:  Medical history, symptoms, and a physical exam.  Diagnostic tests, which may include:  Echocardiogram.  Electrocardiogram (ECG).  Chest X-ray.  Blood tests.  Exercise stress test.  Radionuclide scans.  Cardiac catheterization and angiogram. How is this treated? Treatment for this condition is aimed at managing the symptoms of heart failure. Medicines, behavioral changes, or other treatments may be necessary to treat heart failure. Medicines  These may include:  Angiotensin-converting enzyme (ACE) inhibitors. This type of medicine blocks the effects of a blood protein called angiotensin-converting enzyme. ACE inhibitors relax (dilate) the blood vessels and help to lower blood pressure.  Angiotensin receptor blockers (ARBs). This type of medicine blocks the actions of a blood protein called angiotensin. ARBs dilate the blood vessels and help to lower blood pressure.  Water pills (diuretics). Diuretics  pills (diuretics). Diuretics cause the kidneys to remove salt and water from the blood. The extra fluid is removed through urination, leaving a lower volume of blood that the heart has to pump. °· Beta blockers. These improve heart  muscle strength and they prevent the heart from beating too quickly. °· Digoxin. This increases the force of the heartbeat. ° °Healthy behavior changes °These may include: °· Reaching and maintaining a healthy weight. °· Stopping smoking or chewing tobacco. °· Eating heart-healthy foods. °· Limiting or avoiding alcohol. °· Stopping use of street drugs (illegal drugs). °· Physical activity. ° °Other treatments °These may include: °· Surgery to open blocked coronary arteries or repair damaged heart valves. °· Placement of a biventricular pacemaker to improve heart muscle function (cardiac resynchronization therapy). This device paces both the right ventricle and left ventricle. °· Placement of a device to treat serious abnormal heart rhythms (implantable cardioverter defibrillator, or ICD). °· Placement of a device to improve the pumping ability of the heart (left ventricular assist device, or LVAD). °· Heart transplant. This can cure heart failure, and it is considered for certain patients who do not improve with other therapies. ° °Follow these instructions at home: °Medicines °· Take over-the-counter and prescription medicines only as told by your health care provider. Medicines are important in reducing the workload of your heart, slowing the progression of heart failure, and improving your symptoms. °? Do not stop taking your medicine unless your health care provider told you to do that. °? Do not skip any dose of medicine. °? Refill your prescriptions before you run out of medicine. You need your medicines every day. °Eating and drinking ° °· Eat heart-healthy foods. Talk with a dietitian to make an eating plan that is right for you. °? Choose foods that contain no trans fat and are low in saturated fat and cholesterol. Healthy choices include fresh or frozen fruits and vegetables, fish, lean meats, legumes, fat-free or low-fat dairy products, and whole-grain or high-fiber foods. °? Limit salt (sodium) if  directed by your health care provider. Sodium restriction may reduce symptoms of heart failure. Ask a dietitian to recommend heart-healthy seasonings. °? Use healthy cooking methods instead of frying. Healthy methods include roasting, grilling, broiling, baking, poaching, steaming, and stir-frying. °· Limit your fluid intake if directed by your health care provider. Fluid restriction may reduce symptoms of heart failure. °Lifestyle °· Stop smoking or using chewing tobacco. Nicotine and tobacco can damage your heart and your blood vessels. Do not use nicotine gum or patches before talking to your health care provider. °· Limit alcohol intake to no more than 1 drink per day for non-pregnant women and 2 drinks per day for men. One drink equals 12 oz of beer, 5 oz of wine, or 1½ oz of hard liquor. °? Drinking more than that is harmful to your heart. Tell your health care provider if you drink alcohol several times a week. °? Talk with your health care provider about whether any level of alcohol use is safe for you. °? If your heart has already been damaged by alcohol or you have severe heart failure, drinking alcohol should be stopped completely. °· Stop use of illegal drugs. °· Lose weight if directed by your health care provider. Weight loss may reduce symptoms of heart failure. °· Do moderate physical activity if directed by your health care provider. People who are elderly and people with severe heart failure should consult with a health care provider for physical activity recommendations. °  important information   Weigh yourself every day. Keeping track of your weight daily helps you to notice excess fluid sooner.  Weigh yourself every morning after you urinate and before you eat breakfast.  Wear the same amount of clothing each time you weigh yourself.  Record your daily weight. Provide your health care provider with your weight record.  Monitor and record your blood pressure as told by your health care  provider.  Check your pulse as told by your health care provider. Dealing with extreme temperatures   If the weather is extremely hot:  Avoid vigorous physical activity.  Use air conditioning or fans or seek a cooler location.  Avoid caffeine and alcohol.  Wear loose-fitting, lightweight, and light-colored clothing.  If the weather is extremely cold:  Avoid vigorous physical activity.  Layer your clothes.  Wear mittens or gloves, a hat, and a scarf when you go outside.  Avoid alcohol. General instructions   Manage other health conditions such as hypertension, diabetes, thyroid disease, or abnormal heart rhythms as told by your health care provider.  Learn to manage stress. If you need help to do this, ask your health care provider.  Plan rest periods when fatigued.  Get ongoing education and support as needed.  Participate in or seek rehabilitation as needed to maintain or improve independence and quality of life.  Stay up to date with immunizations. Keeping current on pneumococcal and influenza immunizations is especially important to prevent respiratory infections.  Keep all follow-up visits as told by your health care provider. This is important. Contact a health care provider if:  You have a rapid weight gain.  You have increasing shortness of breath that is unusual for you.  You are unable to participate in your usual physical activities.  You tire easily.  You cough more than normal, especially with physical activity.  You have any swelling or more swelling in areas such as your hands, feet, ankles, or abdomen.  You are unable to sleep because it is hard to breathe.  You feel like your heart is beating quickly (palpitations).  You become dizzy or light-headed when you stand up. Get help right away if:  You have difficulty breathing.  You notice or your family notices a change in your awareness, such as having trouble staying awake or having  difficulty with concentration.  You have pain or discomfort in your chest.  You have an episode of fainting (syncope). This information is not intended to replace advice given to you by your health care provider. Make sure you discuss any questions you have with your health care provider. Document Released: 05/17/2005 Document Revised: 01/20/2016 Document Reviewed: 12/10/2015 Elsevier Interactive Patient Education  2017 [employer]. Hypertension Hypertension, commonly called high blood pressure, is when the force of blood pumping through the arteries is too strong. The arteries are the blood vessels that carry blood from the heart throughout the body. Hypertension forces the heart to work harder to pump blood and may cause arteries to become narrow or stiff. Having untreated or uncontrolled hypertension can cause heart attacks, strokes, kidney disease, and other problems. A blood pressure reading consists of a higher number over a lower number. Ideally, your blood pressure should be below 120/80. The first ("top") number is called the systolic pressure. It is a measure of the pressure in your arteries as your heart beats. The second ("bottom") number is called the diastolic pressure. It is a measure of the pressure in your arteries as the heart  relaxes. What are the causes? The cause of this condition is not known. What increases the risk? Some risk factors for high blood pressure are under your control. Others are not. Factors you can change   Smoking.  Having type 2 diabetes mellitus, high cholesterol, or both.  Not getting enough exercise or physical activity.  Being overweight.  Having too much fat, sugar, calories, or salt (sodium) in your diet.  Drinking too much alcohol. Factors that are difficult or impossible to change   Having chronic kidney disease.  Having a family history of high blood pressure.  Age. Risk increases with age.  Race. You may be at higher risk if you  are African-American.  Gender. Men are at higher risk than women before age 71. After age 68, women are at higher risk than men.  Having obstructive sleep apnea.  Stress. What are the signs or symptoms? Extremely high blood pressure (hypertensive crisis) may cause:  Headache.  Anxiety.  Shortness of breath.  Nosebleed.  Nausea and vomiting.  Severe chest pain.  Jerky movements you cannot control (seizures). How is this diagnosed? This condition is diagnosed by measuring your blood pressure while you are seated, with your arm resting on a surface. The cuff of the blood pressure monitor will be placed directly against the skin of your upper arm at the level of your heart. It should be measured at least twice using the same arm. Certain conditions can cause a difference in blood pressure between your right and left arms. Certain factors can cause blood pressure readings to be lower or higher than normal (elevated) for a short period of time:  When your blood pressure is higher when you are in a health care provider's office than when you are at home, this is called white coat hypertension. Most people with this condition do not need medicines.  When your blood pressure is higher at home than when you are in a health care provider's office, this is called masked hypertension. Most people with this condition may need medicines to control blood pressure. If you have a high blood pressure reading during one visit or you have normal blood pressure with other risk factors:  You may be asked to return on a different day to have your blood pressure checked again.  You may be asked to monitor your blood pressure at home for 1 week or longer. If you are diagnosed with hypertension, you may have other blood or imaging tests to help your health care provider understand your overall risk for other conditions. How is this treated? This condition is treated by making healthy lifestyle changes,  such as eating healthy foods, exercising more, and reducing your alcohol intake. Your health care provider may prescribe medicine if lifestyle changes are not enough to get your blood pressure under control, and if:  Your systolic blood pressure is above 130.  Your diastolic blood pressure is above 80. Your personal target blood pressure may vary depending on your medical conditions, your age, and other factors. Follow these instructions at home: Eating and drinking   Eat a diet that is high in fiber and potassium, and low in sodium, added sugar, and fat. An example eating plan is called the DASH (Dietary Approaches to Stop Hypertension) diet. To eat this way:  Eat plenty of fresh fruits and vegetables. Try to fill half of your plate at each meal with fruits and vegetables.  Eat whole grains, such as whole wheat pasta, brown rice, or whole  grain bread. Fill about one quarter of your plate with whole grains.  Eat or drink low-fat dairy products, such as skim milk or low-fat yogurt.  Avoid fatty cuts of meat, processed or cured meats, and poultry with skin. Fill about one quarter of your plate with lean proteins, such as fish, chicken without skin, beans, eggs, and tofu.  Avoid premade and processed foods. These tend to be higher in sodium, added sugar, and fat.  Reduce your daily sodium intake. Most people with hypertension should eat less than 1,500 mg of sodium a day.  Limit alcohol intake to no more than 1 drink a day for nonpregnant women and 2 drinks a day for men. One drink equals 12 oz of beer, 5 oz of wine, or 1 oz of hard liquor. Lifestyle   Work with your health care provider to maintain a healthy body weight or to lose weight. Ask what an ideal weight is for you.  Get at least 30 minutes of exercise that causes your heart to beat faster (aerobic exercise) most days of the week. Activities may include walking, swimming, or biking.  Include exercise to strengthen your muscles  (resistance exercise), such as pilates or lifting weights, as part of your weekly exercise routine. Try to do these types of exercises for 30 minutes at least 3 days a week.  Do not use any products that contain nicotine or tobacco, such as cigarettes and e-cigarettes. If you need help quitting, ask your health care provider.  Monitor your blood pressure at home as told by your health care provider.  Keep all follow-up visits as told by your health care provider. This is important. Medicines   Take over-the-counter and prescription medicines only as told by your health care provider. Follow directions carefully. Blood pressure medicines must be taken as prescribed.  Do not skip doses of blood pressure medicine. Doing this puts you at risk for problems and can make the medicine less effective.  Ask your health care provider about side effects or reactions to medicines that you should watch for. Contact a health care provider if:  You think you are having a reaction to a medicine you are taking.  You have headaches that keep coming back (recurring).  You feel dizzy.  You have swelling in your ankles.  You have trouble with your vision. Get help right away if:  You develop a severe headache or confusion.  You have unusual weakness or numbness.  You feel faint.  You have severe pain in your chest or abdomen.  You vomit repeatedly.  You have trouble breathing. Summary  Hypertension is when the force of blood pumping through your arteries is too strong. If this condition is not controlled, it may put you at risk for serious complications.  Your personal target blood pressure may vary depending on your medical conditions, your age, and other factors. For most people, a normal blood pressure is less than 120/80.  Hypertension is treated with lifestyle changes, medicines, or a combination of both. Lifestyle changes include weight loss, eating a healthy, low-sodium diet, exercising  more, and limiting alcohol. This information is not intended to replace advice given to you by your health care provider. Make sure you discuss any questions you have with your health care provider. Document Released: 05/17/2005 Document Revised: 04/14/2016 Document Reviewed: 04/14/2016 Elsevier Interactive Patient Education  2017 [employer].

## 2016-10-06 NOTE — Progress Notes (Signed)
Tamara Silva, is a 56 y.o. female  FOY:[id number]  VEH:[id number]  DOB - Oct 09, 1960  Chief Complaint  Patient presents with  . Shortness of Breath    oxygen needed      Subjective:   Tamara Silva is a 57 y.o. female with a history of type 2 diabetes mellitus (A1c 9.0% in November 2017), essential hypertension, CHF, bipolar disorder, obstructive sleep apnea, COPD (on 2 L oxygen) here today for a follow up visit and examination to qualify for ongoing oxygen use and refill. She gets SOB and hypoxemic after walking even short distance. She ambulates with her portable oxygen all the time. She however uses it mostly at night with her CPAP machine to improve her nocturnal oxygenation. She has no new complaint. She thinks her Blood Sugar is better controlled as well as her BP. She claims adherence with her medications. Patient has No headache, No chest pain, No abdominal pain - No Nausea, No new weakness tingling or numbness.  No problems updated.  ALLERGIES: Allergies  Allergen Reactions  . Metolazone Other (See Comments)    Dizziness and falling  . Plavix [Clopidogrel Bisulfate] Other (See Comments)    High PRU's - non-responder  . Nsaids Other (See Comments)    "I do not take NSAIDs d/t interfering with other meds"    PAST MEDICAL HISTORY: Past Medical History:  Diagnosis Date  . Asthma   . Diabetes mellitus without complication ([place])     MEDICATIONS AT HOME: Prior to Admission medications   Medication Sig Start Date End Date Taking? Authorizing Provider  ACCU-CHEK AVIVA PLUS test strip USE 1 STRIP TO CHECK GLUCOSE 3 TIMES DAILY 01/14/16   Tresa Garter, MD  ACCU-CHEK FASTCLIX LANCETS MISC 1 applicator by Does not apply route 4 (four) times daily -  before meals and at bedtime. 08/07/15   Arnoldo Morale, MD  ACCU-CHEK SOFTCLIX LANCETS lancets USE 1 LANCET TO CHECK GLUCOSE 3 TIMES DAILY 01/14/16   Tresa Garter, MD  ALPRAZolam Duanne Moron) 1 MG tablet Take 1 mg by mouth  at bedtime as needed for anxiety.    [provider]  aspirin 81 MG tablet Take 1 tablet (81 mg total) by mouth daily. 10/09/13   Barrett, Evelene Croon, PA-C  Blood Glucose Monitoring Suppl (ACCU-CHEK AVIVA) device Use as instructed 3 times daily before meals. 08/06/15   Arnoldo Morale, MD  carvedilol (COREG) 12.5 MG tablet TAKE 1 TABLET BY MOUTH 2 TIMES A DAY WITH A MEAL. 09/01/16   Tresa Garter, MD  cycloSPORINE (RESTASIS) 0.05 % ophthalmic emulsion Place 2 drops into both eyes 2 (two) times daily as needed (for dry eyes).     [provider]  EASY TOUCH PEN NEEDLES 31G X 8 MM MISC USE AS DIRECTED TO INJECT INSULIN 01/14/16   Dyllon Henken, Marlena Clipper, MD  EFFIENT 10 MG TABS tablet TAKE ONE TABLET BY MOUTH DAILY. 03/18/15   Bensimhon, Shaune Pascal, MD  fluticasone (FLONASE) 50 MCG/ACT nasal spray INSTILL 2 SPRAYS IN EACH NOSTRIL TWICE DAILY. 09/01/16   Tresa Garter, MD  furosemide (LASIX) 40 MG tablet TAKE 2 TABLETS BY MOUTH TWICE DAILY 05/05/16   Tresa Garter, MD  INSULIN SYRINGE .5CC/29G 29G X 1/2" 0.5 ML MISC Use as instructed by physician 03/28/14   Tresa Garter, MD  ipratropium-albuterol (DUONEB) 0.5-2.5 (3) MG/3ML SOLN INHALE THE CONTENTS OF 1 VIAL VIA NEBULIZER EVERY 6 HOURS AS NEEDED 09/01/16   Tresa Garter, MD  LEVEMIR 100 UNIT/ML  injection INJECT 45 UNITS SUBCUTANEOUSLY EVERY NIGHT AT BEDTIME. 09/01/16   Tresa Garter, MD  nitroGLYCERIN (NITROSTAT) 0.4 MG SL tablet Place 1 tablet (0.4 mg total) under the tongue every 5 (five) minutes as needed for chest pain. 08/03/16   Bensimhon, Shaune Pascal, MD  NOVOLOG FLEXPEN 100 UNIT/ML FlexPen INJECT 15-20 UNITS SUBCUTANEOUSLY 3 TIMES A DAY WITH MEALS 05/05/16   Tresa Garter, MD  omeprazole (PRILOSEC) 40 MG capsule Take 1 capsule (40 mg total) by mouth daily. 10/06/16   Tresa Garter, MD  Paliperidone Palmitate (INVEGA SUSTENNA) 117 MG/0.75ML SUSP Inject 156 mg into the muscle every 3 (three) months.      [provider]  prazosin (MINIPRESS) 5 MG capsule TAKE 1 CAPSULE BY MOUTH EVERY NIGHT AT BEDTIME FOR NIGHTMARES. 09/01/16   Tresa Garter, MD  PROAIR HFA 108 (90 Base) MCG/ACT inhaler INHALE 2 PUFFS BY MOUTH 4 TIMES DAILY AS NEEDED. 09/01/16   Tresa Garter, MD  rosuvastatin (CRESTOR) 10 MG tablet Take 1 tablet (10 mg total) by mouth daily. 07/21/16 10/19/16  Larey Dresser, MD  spironolactone (ALDACTONE) 25 MG tablet Take 1 tablet (25 mg total) by mouth daily. 10/06/16   Tresa Garter, MD  umeclidinium-vilanterol (ANORO ELLIPTA) 62.5-25 MCG/INH AEPB Inhale 1 puff into the lungs daily. 07/13/16   de Dios, [place], MD  valsartan (DIOVAN) 80 MG tablet Take 1 tablet (80 mg total) by mouth daily. 07/21/16   Larey Dresser, MD    Objective:   Vitals:   10/06/16 1628  BP: 112/68  Pulse: 65  Resp: 18  Temp: 98.4 F (36.9 C)  TempSrc: Oral  SpO2: 92%  Weight: 245 lb (111.1 kg)  Height: 5' 7.5" (1.715 m)   Exam General appearance : Awake, alert, not in any distress. Speech Clear. Not toxic looking HEENT: Atraumatic and Normocephalic, pupils equally reactive to light and accomodation Neck: Supple, no JVD. No cervical lymphadenopathy.  Chest: Good air entry bilaterally, no added sounds  CVS: S1 S2 regular, no murmurs.  Abdomen: Bowel sounds present, Non tender and not distended with no gaurding, rigidity or rebound. Extremities: B/L Lower Ext shows no edema, both legs are warm to touch Neurology: Awake alert, and oriented X 3, CN II-XII intact, Non focal Skin: No Rash  Data Review Lab Results  Component Value Date   HGBA1C 9.0 04/29/2016   HGBA1C 7.9 (H) 05/21/2015   HGBA1C 7.50 10/31/2014    Assessment & Plan   1. Uncontrolled type 2 diabetes mellitus without complication, with long-term current use of insulin (HCC)  - POCT A1C - POCT UA - Microalbumin - Glucose (CBG)  2. Essential hypertension  - spironolactone (ALDACTONE) 25 MG tablet; Take 1  tablet (25 mg total) by mouth daily.  Dispense: 90 tablet; Refill: 3  3. COPD GOLD III   - Post ambulatory pulse oxy between 89% on RA and 94% on 2 L of Oxygen by [place] - Continue oxygen use   4. OSA (obstructive sleep apnea)  - Continue CPAP use  5. Dyslipidemia  To address this please limit saturated fat to no more than 7% of your calories, limit cholesterol to 200 mg/day, increase fiber and exercise as tolerated. If needed we may add another cholesterol lowering medication to your regimen.   Patient have been counseled extensively about nutrition and exercise. Other issues discussed during this visit include: low cholesterol diet, weight control and daily exercise, foot care, annual eye examinations at Ophthalmology, importance  of adherence with medications and regular follow-up. We also discussed long term complications of uncontrolled diabetes and hypertension.   Return in about 3 months (around 01/06/2017) for Heart Failure and Hypertension, Hemoglobin A1C and Follow up, DM, Annual Physical.  The patient was given clear instructions to go to ER or return to medical center if symptoms don't improve, worsen or new problems develop. The patient verbalized understanding. The patient was told to call to get lab results if they haven't heard anything in the next week.   This note has been created with Surveyor, quantity. Any transcriptional errors are unintentional.    Angelica Chessman, MD, [place], [place], [place], [place] and [place], [place]   10/06/2016, 4:40 PM

## 2016-11-04 ENCOUNTER — Other Ambulatory Visit (HOSPITAL_COMMUNITY): Payer: Self-pay | Admitting: Internal Medicine

## 2016-11-04 ENCOUNTER — Other Ambulatory Visit (HOSPITAL_COMMUNITY): Payer: Self-pay | Admitting: Cardiology

## 2016-11-04 ENCOUNTER — Other Ambulatory Visit: Payer: Self-pay | Admitting: Internal Medicine

## 2016-11-04 MED ORDER — PRASUGREL HCL 10 MG PO TABS: 10.0000 mg | ORAL_TABLET | Freq: Every day | ORAL | 3 refills | Status: DC

## 2016-12-06 ENCOUNTER — Other Ambulatory Visit: Payer: Self-pay | Admitting: Internal Medicine

## 2016-12-06 ENCOUNTER — Other Ambulatory Visit (HOSPITAL_COMMUNITY): Payer: Self-pay | Admitting: Internal Medicine

## 2016-12-12 ENCOUNTER — Encounter (HOSPITAL_COMMUNITY): Payer: Self-pay | Admitting: Emergency Medicine

## 2016-12-12 ENCOUNTER — Emergency Department (HOSPITAL_COMMUNITY)
Admission: EM | Admit: 2016-12-12 | Discharge: 2016-12-12 | Disposition: A | Discharge status: Home / Self Care | Payer: Medicaid Other | Attending: Emergency Medicine | Admitting: Emergency Medicine

## 2016-12-12 DIAGNOSIS — I130 Hypertensive heart and chronic kidney disease with heart failure and stage 1 through stage 4 chronic kidney disease, or unspecified chronic kidney disease: Secondary | ICD-10-CM | POA: Insufficient documentation

## 2016-12-12 DIAGNOSIS — J449 Chronic obstructive pulmonary disease, unspecified: Secondary | ICD-10-CM | POA: Insufficient documentation

## 2016-12-12 DIAGNOSIS — Z7982 Long term (current) use of aspirin: Secondary | ICD-10-CM | POA: Insufficient documentation

## 2016-12-12 DIAGNOSIS — Z87891 Personal history of nicotine dependence: Secondary | ICD-10-CM | POA: Diagnosis not present

## 2016-12-12 DIAGNOSIS — N182 Chronic kidney disease, stage 2 (mild): Secondary | ICD-10-CM | POA: Insufficient documentation

## 2016-12-12 DIAGNOSIS — Z79899 Other long term (current) drug therapy: Secondary | ICD-10-CM | POA: Insufficient documentation

## 2016-12-12 DIAGNOSIS — J45909 Unspecified asthma, uncomplicated: Secondary | ICD-10-CM | POA: Insufficient documentation

## 2016-12-12 DIAGNOSIS — Z955 Presence of coronary angioplasty implant and graft: Secondary | ICD-10-CM | POA: Diagnosis not present

## 2016-12-12 DIAGNOSIS — I5043 Acute on chronic combined systolic (congestive) and diastolic (congestive) heart failure: Secondary | ICD-10-CM | POA: Diagnosis not present

## 2016-12-12 DIAGNOSIS — E1165 Type 2 diabetes mellitus with hyperglycemia: Secondary | ICD-10-CM | POA: Insufficient documentation

## 2016-12-12 DIAGNOSIS — B373 Candidiasis of vulva and vagina: Secondary | ICD-10-CM | POA: Diagnosis not present

## 2016-12-12 DIAGNOSIS — N898 Other specified noninflammatory disorders of vagina: Secondary | ICD-10-CM

## 2016-12-12 DIAGNOSIS — Z794 Long term (current) use of insulin: Secondary | ICD-10-CM | POA: Insufficient documentation

## 2016-12-12 DIAGNOSIS — B3731 Acute candidiasis of vulva and vagina: Secondary | ICD-10-CM

## 2016-12-12 DIAGNOSIS — L298 Other pruritus: Secondary | ICD-10-CM | POA: Diagnosis present

## 2016-12-12 MED ORDER — FLUCONAZOLE 100 MG PO TABS
150.0000 mg | ORAL_TABLET | Freq: Once | ORAL | Status: AC
  Administered 2016-12-12: 150 mg via ORAL
  Filled 2016-12-12: qty 2

## 2016-12-12 MED ORDER — FLUCONAZOLE 150 MG PO TABS: 150.0000 mg | ORAL_TABLET | Freq: Once | ORAL | 0 refills | Status: AC

## 2016-12-12 NOTE — ED Triage Notes (Signed)
Pt c/o "I think I have a yeast infection." Pt reports vaginal itching x 3 days. Pt denies vaginal discharge or odor.

## 2016-12-12 NOTE — ED Provider Notes (Signed)
[facility] DEPT Provider Note   CSN: [id number] Arrival date & time: 12/12/16  1026     History   Chief Complaint Chief Complaint  Patient presents with  . Vaginal Itching    HPI Tamara Silva is a 56 y.o. female with a PMHx of HLD, DM2, asthma, CHF, obesity hypoventilation syndrome, anemia, CKD2, HTN, and other medical conditions listed below, who presents to the ED with complaints of vaginal itching 3 days and states that she suspects that she has a yeast infection. States that this feels similar to prior yeast infections. She has not tried anything for her symptoms, and her itching increases after she urinates however she denies any pain with urination or burning sensations. She is sexually active with her husband of 10 years, unprotected. Denies any possibility of STDs, no concern for this and declines testing at this time. She has not had a menstrual cycle in 3 years because she is menopausal. She denies any recent changes in soaps, feminine products, condoms, douching, or any recent antibiotics. She denies vaginal odor, vaginal discharge or bleeding, genital sores, malodorous urine, hematuria, dysuria, urinary frequency, fevers, chills, CP, SOB, abd pain, N/V/D/C, myalgias, arthralgias, numbness, tingling, focal weakness, or any other complaints at this time. PCP is Dr. Doreene Burke at [facility].   The history is provided by the patient and medical records. No language interpreter was used.  Vaginal Itching  This is a new problem. The current episode started more than 2 days ago. The problem occurs constantly. The problem has not changed since onset.Pertinent negatives include no chest pain, no abdominal pain and no shortness of breath. Exacerbated by: urination. Nothing relieves the symptoms. She has tried nothing for the symptoms. The treatment provided no relief.    Past Medical History:  Diagnosis Date  . Asthma   . Diabetes mellitus without complication [facility])     Patient Active  Problem List   Diagnosis Date Noted  . Dyslipidemia 04/29/2016  . Primary insomnia 04/29/2016  . Hypoxemia 09/08/2015  . CHF (congestive heart failure) ([place]) 09/08/2015  . Obesity hypoventilation syndrome ([place])   . Microcytic anemia   . Acute encephalopathy   . Uncontrolled type 2 diabetes mellitus with hyperosmolarity without coma ([place])   . Acute on chronic respiratory failure with hypercapnia ([place])   . CAP (community acquired pneumonia)   . OSA (obstructive sleep apnea)   . Acute on chronic respiratory failure ([place]) 06/22/2015  . Acute on chronic combined systolic (congestive) and diastolic (congestive) heart failure ([place]) 06/21/2015  . Hypoglycemia 06/21/2015  . Uncontrolled type 2 diabetes mellitus without complication, with long-term current use of insulin ([place])   . Dyslipidemia associated with type 2 diabetes mellitus ([place]) 05/21/2015  . Anemia of chronic kidney failure 05/21/2015  . Hyperkalemia 05/21/2015  . COPD GOLD III  06/10/2014  . Acute respiratory failure with hypoxia ([place]) 03/04/2014  . CKD (chronic kidney disease), stage II 02/07/2014  . COPD exacerbation ([place]) 02/01/2014  . CAD S/P percutaneous coronary angioplasty - prior PCI to LAD; RCA PCI: new Xience Alpine DES 2.75 mm x 15 mm  10/07/2013  . Morbid obesity due to excess calories ([place]) 07/30/2013  . Chronic combined systolic and diastolic congestive heart failure, NYHA class 2; EF 30-35% 07/10/2013  . Chronic pulmonary embolism ([place]) 02/08/2013  . Bipolar disorder ([place]) 02/08/2013  . Essential hypertension 02/08/2013  . Chronic anticoagulation 11/27/2012    Past Surgical History:  Procedure Laterality Date  . COLONOSCOPY WITH PROPOFOL N/A 08/03/2013   Procedure:  COLONOSCOPY WITH PROPOFOL;  Surgeon: Jeryl Columbia, MD;  Location: [facility] ENDOSCOPY;  Service: Endoscopy;  Laterality: N/A;  . CORONARY ANGIOPLASTY WITH STENT PLACEMENT     CAD in 2006 x 2 and 2009 2 more- place din REx in [place] and  med  . CORONARY  ANGIOPLASTY WITH STENT PLACEMENT  10/07/2013   Xience Alpine DES 2.75  mm x 15  mm  . ESOPHAGOGASTRODUODENOSCOPY (EGD) WITH PROPOFOL N/A 08/03/2013   Procedure: ESOPHAGOGASTRODUODENOSCOPY (EGD) WITH PROPOFOL;  Surgeon: Jeryl Columbia, MD;  Location: [facility] ENDOSCOPY;  Service: Endoscopy;  Laterality: N/A;  . LEFT HEART CATHETERIZATION WITH CORONARY ANGIOGRAM N/A 10/07/2013   Procedure: LEFT HEART CATHETERIZATION WITH CORONARY ANGIOGRAM;  Surgeon: Leonie Man, MD;  Location: [facility] CATH LAB;  Service: Cardiovascular;  Laterality: N/A;    OB History    No data available       Home Medications    Prior to Admission medications   Medication Sig Start Date End Date Taking? Authorizing Provider  ACCU-CHEK AVIVA PLUS test strip USE 1 STRIP TO CHECK GLUCOSE 3 TIMES DAILY 12/07/16   Tresa Garter, MD  ACCU-CHEK FASTCLIX LANCETS MISC 1 applicator by Does not apply route 4 (four) times daily -  before meals and at bedtime. 08/07/15   Arnoldo Morale, MD  ACCU-CHEK SOFTCLIX LANCETS lancets USE 1 LANCET TO CHECK GLUCOSE 3 TIMES DAILY 01/14/16   Tresa Garter, MD  ALPRAZolam Duanne Moron) 1 MG tablet Take 1 mg by mouth at bedtime as needed for anxiety.    [provider]  aspirin 81 MG tablet Take 1 tablet (81 mg total) by mouth daily. 10/09/13   Barrett, Evelene Croon, PA-C  Blood Glucose Monitoring Suppl (ACCU-CHEK AVIVA) device Use as instructed 3 times daily before meals. 08/06/15   Arnoldo Morale, MD  carvedilol (COREG) 12.5 MG tablet TAKE 1 TABLET BY MOUTH 2 TIMES A DAY WITH A MEAL. 09/01/16   Tresa Garter, MD  cycloSPORINE (RESTASIS) 0.05 % ophthalmic emulsion Place 2 drops into both eyes 2 (two) times daily as needed (for dry eyes).     [provider]  EASY TOUCH PEN NEEDLES 31G X 8 MM MISC USE AS DIRECTED TO INJECT INSULIN 01/14/16   Tresa Garter, MD  fluticasone (FLONASE) 50 MCG/ACT nasal spray INSTILL 2 SPRAYS IN EACH NOSTRIL TWICE DAILY. 09/01/16   Tresa Garter, MD    furosemide (LASIX) 40 MG tablet TAKE 2 TABLETS BY MOUTH TWICE DAILY. 12/07/16   Tresa Garter, MD  insulin detemir (LEVEMIR) 100 UNIT/ML injection Inject 0.45 mLs (45 Units total) into the skin at bedtime. 11/04/16   Tresa Garter, MD  INSULIN SYRINGE .5CC/29G 29G X 1/2" 0.5 ML MISC Use as instructed by physician 03/28/14   Tresa Garter, MD  ipratropium-albuterol (DUONEB) 0.5-2.5 (3) MG/3ML SOLN INHALE THE CONTENTS OF 1 VIAL VIA NEBULIZER EVERY 6 HOURS AS NEEDED 11/04/16   Tresa Garter, MD  nitroGLYCERIN (NITROSTAT) 0.4 MG SL tablet Place 1 tablet (0.4 mg total) under the tongue every 5 (five) minutes as needed for chest pain. 08/03/16   Bensimhon, Shaune Pascal, MD  NOVOLOG FLEXPEN 100 UNIT/ML FlexPen INJECT 15-20 UNITS SUBCUTANEOUSLY 3 TIMES A DAY WITH MEALS. 12/07/16   Tresa Garter, MD  omeprazole (PRILOSEC) 40 MG capsule Take 1 capsule (40 mg total) by mouth daily. 10/06/16   Tresa Garter, MD  Paliperidone Palmitate (INVEGA SUSTENNA) 117 MG/0.75ML SUSP Inject 156 mg into the muscle every 3 (three)  months.     [provider]  prasugrel (EFFIENT) 10 MG TABS tablet Take 1 tablet (10 mg total) by mouth daily. 11/04/16   Larey Dresser, MD  prazosin (MINIPRESS) 5 MG capsule TAKE 1 CAPSULE BY MOUTH EVERY NIGHT AT BEDTIME FOR NIGHTMARES. 09/01/16   Tresa Garter, MD  PROAIR HFA 108 (90 Base) MCG/ACT inhaler INHALE 2 PUFFS BY MOUTH 4 TIMES DAILY AS NEEDED. 09/01/16   Tresa Garter, MD  rosuvastatin (CRESTOR) 10 MG tablet Take 1 tablet (10 mg total) by mouth daily. 07/21/16 10/19/16  Larey Dresser, MD  spironolactone (ALDACTONE) 25 MG tablet Take 1 tablet (25 mg total) by mouth daily. 10/06/16   Tresa Garter, MD  spironolactone (ALDACTONE) 25 MG tablet TAKE 1 TABLET BY MOUTH EVERY DAY *NEW DOSAGE 11/04/16   Larey Dresser, MD  umeclidinium-vilanterol [facility] ELLIPTA) 62.5-25 MCG/INH AEPB Inhale 1 puff into the lungs daily. 07/13/16   de Dios, [place], MD  valsartan (DIOVAN) 80 MG tablet Take 1 tablet (80 mg total) by mouth daily. 07/21/16   Larey Dresser, MD    Family History Family History  Problem Relation Age of Onset  . Stroke Mother   . Hypertension Mother   . Colon cancer Father   . Diabetes Father   . Heart attack Father   . Sarcoidosis Other   . Breast cancer Paternal Aunt   . Emphysema Brother   . Lung disease Brother        Unknown type, 3 brothers, one with liver and lung disease  . Asthma Paternal Aunt     Social History Social History  Substance Use Topics  . Smoking status: Former Smoker    Packs/day: 0.50    Years: 20.00    Types: Cigarettes    Quit date: 04/30/2014  . Smokeless tobacco: Never Used     Comment: smoked off and on x 20 years  . Alcohol use No     Allergies   Metolazone; Plavix [clopidogrel bisulfate]; and Nsaids   Review of Systems Review of Systems  Constitutional: Negative for chills and fever.  Respiratory: Negative for shortness of breath.   Cardiovascular: Negative for chest pain.  Gastrointestinal: Negative for abdominal pain, constipation, diarrhea, nausea and vomiting.  Genitourinary: Positive for vaginal pain (+vaginal itching). Negative for dysuria, frequency, genital sores, hematuria, vaginal bleeding and vaginal discharge.       No vaginal odor, no malodorous urine  Musculoskeletal: Negative for arthralgias and myalgias.  Skin: Negative for color change.  Allergic/Immunologic: Positive for immunocompromised state (DM2).  Neurological: Negative for weakness and numbness.  Psychiatric/Behavioral: Negative for confusion.   All other systems reviewed and are negative for acute change except as noted in the HPI.    Physical Exam Updated Vital Signs BP (!) 121/57 (BP Location: Left Arm)   Pulse 73   Temp 98.2 F (36.8 C) (Oral)   Resp 16   Ht 5\' 7"  (1.702 m)   Wt 111.1 kg (245 lb)   LMP 07/28/2013 Comment: perimenopausal  SpO2 97%   BMI 38.37 kg/m    Physical Exam  Constitutional: She is oriented to person, place, and time. Vital signs are normal. She appears well-developed and well-nourished.  Non-toxic appearance. No distress.  Afebrile, nontoxic, NAD  HENT:  Head: Normocephalic and atraumatic.  Mouth/Throat: Oropharynx is clear and moist and mucous membranes are normal.  Eyes: Conjunctivae and EOM are normal. Right eye exhibits no discharge. Left eye exhibits no  discharge.  Neck: Normal range of motion. Neck supple.  Cardiovascular: Normal rate, regular rhythm, normal heart sounds and intact distal pulses.  Exam reveals no gallop and no friction rub.   No murmur heard. Pulmonary/Chest: Effort normal and breath sounds normal. No respiratory distress. She has no decreased breath sounds. She has no wheezes. She has no rhonchi. She has no rales.  Abdominal: Soft. Normal appearance and bowel sounds are normal. She exhibits no distension. There is no tenderness. There is no rigidity, no rebound, no guarding, no CVA tenderness, no tenderness at McBurney's point and negative Murphy's sign.  Soft, NTND, +BS throughout, no r/g/r, neg murphy's, neg mcburney's, no CVA TTP   Genitourinary:  Genitourinary Comments: Pt declined  Musculoskeletal: Normal range of motion.  Neurological: She is alert and oriented to person, place, and time. She has normal strength. No sensory deficit.  Skin: Skin is warm, dry and intact. No rash noted.  Psychiatric: She has a normal mood and affect.  Nursing note and vitals reviewed.    ED Treatments / Results  Labs (all labs ordered are listed, but only abnormal results are displayed) Labs Reviewed - No data to display  EKG  EKG Interpretation None       Radiology No results found.  Procedures Procedures (including critical care time)  Medications Ordered in ED Medications  fluconazole (DIFLUCAN) tablet 150 mg (150 mg Oral Given 12/12/16 1146)     Initial Impression / Assessment and Plan / ED  Course  I have reviewed the triage vital signs and the nursing notes.  Pertinent labs & imaging results that were available during my care of the patient were reviewed by me and considered in my medical decision making (see chart for details).     56 y.o. female here with vaginal itching 3 days, states that it feels like prior yeast infections. Denies odor or discharge, no other vaginal complaints, no abdominal pain. On exam, no abdominal tenderness. Discussed options of performing pelvic exam to check for infection versus treating empirically for yeast since this is what sounds like she may have, and patient declined pelvic exam and opted for empiric tx. Sexually active with her husband of 10 years, and does not feel concerned that she has STD; given lack of symptoms, doubt this as well. Advised that without full testing we can't be sure, but she still declined. Will treat with diflucan now, and give rx for another dose if symptoms persist 12/15/16. F/up with PCP in 1wk for recheck, if symptoms continue then she may need outpatient pelvic exam and further testing. Advised avoidance of harsh soaps, douching, etc. I explained the diagnosis and have given explicit precautions to return to the ER including for any other new or worsening symptoms. The patient understands and accepts the medical plan as it's been dictated and I have answered their questions. Discharge instructions concerning home care and prescriptions have been given. The patient is STABLE and is discharged to home in good condition.    Final Clinical Impressions(s) / ED Diagnoses   Final diagnoses:  Vaginal itching  Vaginal yeast infection    New Prescriptions New Prescriptions   FLUCONAZOLE (DIFLUCAN) 150 MG TABLET    Take 1 tablet (150 mg total) by mouth once. Take on 12/15/16 if symptoms [address] [place], [place] 12/12/16 1220    Quintella Reichert, MD 12/12/16 1540

## 2016-12-12 NOTE — Discharge Instructions (Signed)
Your symptoms are likely from a vaginal yeast infection. You were treated with diflucan today, but if your symptoms continue on 12/15/16 then fill the diflucan prescription and take it at that time. Avoid harsh soaps, creams, feminine products, or douching. Follow up with your regular doctor in 1 week for recheck of symptoms. Return to the ER for emergent changes or worsening symptoms.

## 2017-01-05 ENCOUNTER — Other Ambulatory Visit: Payer: Self-pay | Admitting: Internal Medicine

## 2017-01-05 ENCOUNTER — Other Ambulatory Visit (HOSPITAL_COMMUNITY): Payer: Self-pay | Admitting: Internal Medicine

## 2017-01-26 ENCOUNTER — Ambulatory Visit: Payer: Self-pay | Admitting: Internal Medicine

## 2017-02-03 ENCOUNTER — Other Ambulatory Visit (HOSPITAL_COMMUNITY): Payer: Self-pay | Admitting: Internal Medicine

## 2017-02-08 ENCOUNTER — Other Ambulatory Visit: Payer: Self-pay | Admitting: Internal Medicine

## 2017-03-08 ENCOUNTER — Other Ambulatory Visit: Payer: Self-pay | Admitting: Internal Medicine

## 2017-03-08 ENCOUNTER — Other Ambulatory Visit: Payer: Self-pay | Admitting: Cardiology

## 2017-04-06 ENCOUNTER — Telehealth: Payer: Self-pay | Admitting: Adult Health

## 2017-04-06 MED ORDER — UMECLIDINIUM-VILANTEROL 62.5-25 MCG/INH IN AEPB: 1.0000 | INHALATION_SPRAY | Freq: Every day | RESPIRATORY_TRACT | 2 refills | Status: DC

## 2017-04-06 NOTE — Telephone Encounter (Signed)
Sent over RX to [facility]. Nothing further is needed.

## 2017-04-07 ENCOUNTER — Other Ambulatory Visit: Payer: Self-pay | Admitting: Cardiology

## 2017-04-07 ENCOUNTER — Other Ambulatory Visit: Payer: Self-pay | Admitting: Internal Medicine

## 2017-04-11 ENCOUNTER — Encounter (HOSPITAL_COMMUNITY): Payer: Self-pay

## 2017-04-19 ENCOUNTER — Encounter (HOSPITAL_COMMUNITY): Payer: Self-pay

## 2017-04-19 ENCOUNTER — Ambulatory Visit (HOSPITAL_COMMUNITY)
Admission: RE | Admit: 2017-04-19 | Discharge: 2017-04-19 | Disposition: A | Discharge status: Home / Self Care | Payer: Medicaid Other | Source: Ambulatory Visit | Attending: Internal Medicine | Admitting: Internal Medicine

## 2017-04-19 VITALS — BP 134/62 | HR 71 | Wt 276.4 lb

## 2017-04-19 DIAGNOSIS — I10 Essential (primary) hypertension: Secondary | ICD-10-CM | POA: Diagnosis not present

## 2017-04-19 DIAGNOSIS — E785 Hyperlipidemia, unspecified: Secondary | ICD-10-CM | POA: Insufficient documentation

## 2017-04-19 DIAGNOSIS — I252 Old myocardial infarction: Secondary | ICD-10-CM | POA: Diagnosis not present

## 2017-04-19 DIAGNOSIS — Z79899 Other long term (current) drug therapy: Secondary | ICD-10-CM | POA: Insufficient documentation

## 2017-04-19 DIAGNOSIS — I255 Ischemic cardiomyopathy: Secondary | ICD-10-CM | POA: Insufficient documentation

## 2017-04-19 DIAGNOSIS — E1169 Type 2 diabetes mellitus with other specified complication: Secondary | ICD-10-CM

## 2017-04-19 DIAGNOSIS — I5022 Chronic systolic (congestive) heart failure: Secondary | ICD-10-CM | POA: Diagnosis not present

## 2017-04-19 DIAGNOSIS — Z8249 Family history of ischemic heart disease and other diseases of the circulatory system: Secondary | ICD-10-CM | POA: Diagnosis not present

## 2017-04-19 DIAGNOSIS — Z9861 Coronary angioplasty status: Secondary | ICD-10-CM

## 2017-04-19 DIAGNOSIS — Z7982 Long term (current) use of aspirin: Secondary | ICD-10-CM | POA: Insufficient documentation

## 2017-04-19 DIAGNOSIS — Z803 Family history of malignant neoplasm of breast: Secondary | ICD-10-CM | POA: Insufficient documentation

## 2017-04-19 DIAGNOSIS — Z825 Family history of asthma and other chronic lower respiratory diseases: Secondary | ICD-10-CM | POA: Insufficient documentation

## 2017-04-19 DIAGNOSIS — Z794 Long term (current) use of insulin: Secondary | ICD-10-CM | POA: Insufficient documentation

## 2017-04-19 DIAGNOSIS — E119 Type 2 diabetes mellitus without complications: Secondary | ICD-10-CM | POA: Insufficient documentation

## 2017-04-19 DIAGNOSIS — Z800 Family history of malignant neoplasm of digestive organs: Secondary | ICD-10-CM | POA: Diagnosis not present

## 2017-04-19 DIAGNOSIS — Z86711 Personal history of pulmonary embolism: Secondary | ICD-10-CM | POA: Diagnosis not present

## 2017-04-19 DIAGNOSIS — I110 Hypertensive heart disease with heart failure: Secondary | ICD-10-CM | POA: Insufficient documentation

## 2017-04-19 DIAGNOSIS — Z7902 Long term (current) use of antithrombotics/antiplatelets: Secondary | ICD-10-CM | POA: Diagnosis not present

## 2017-04-19 DIAGNOSIS — Z955 Presence of coronary angioplasty implant and graft: Secondary | ICD-10-CM | POA: Diagnosis not present

## 2017-04-19 DIAGNOSIS — I2510 Atherosclerotic heart disease of native coronary artery without angina pectoris: Secondary | ICD-10-CM | POA: Insufficient documentation

## 2017-04-19 DIAGNOSIS — Z87891 Personal history of nicotine dependence: Secondary | ICD-10-CM | POA: Diagnosis not present

## 2017-04-19 DIAGNOSIS — I5042 Chronic combined systolic (congestive) and diastolic (congestive) heart failure: Secondary | ICD-10-CM

## 2017-04-19 DIAGNOSIS — Z9981 Dependence on supplemental oxygen: Secondary | ICD-10-CM | POA: Insufficient documentation

## 2017-04-19 DIAGNOSIS — J449 Chronic obstructive pulmonary disease, unspecified: Secondary | ICD-10-CM | POA: Insufficient documentation

## 2017-04-19 DIAGNOSIS — Z823 Family history of stroke: Secondary | ICD-10-CM | POA: Diagnosis not present

## 2017-04-19 DIAGNOSIS — G4733 Obstructive sleep apnea (adult) (pediatric): Secondary | ICD-10-CM | POA: Diagnosis not present

## 2017-04-19 DIAGNOSIS — R001 Bradycardia, unspecified: Secondary | ICD-10-CM | POA: Insufficient documentation

## 2017-04-19 MED ORDER — FUROSEMIDE 40 MG PO TABS: 40.0000 mg | ORAL_TABLET | Freq: Every day | ORAL | 3 refills | Status: DC

## 2017-04-19 NOTE — Patient Instructions (Addendum)
START taking Lasix 40 mg (1 Tablet) Once Daily.    Follow up in 4 weeks:  Thursday December 20th @ 3:00pm

## 2017-04-19 NOTE — Progress Notes (Signed)
Advanced Heart Failure Medication Review by a Pharmacist  Does the patient  feel that his/her medications are working for him/her?  yes  Has the patient been experiencing any side effects to the medications prescribed?  no  Does the patient measure his/her own blood pressure or blood glucose at home?  Not asked at this visit    Does the patient have any problems obtaining medications due to transportation or finances?   no  Understanding of regimen: poor Understanding of indications: fair Potential of compliance: good Patient understands to avoid NSAIDs. Patient understands to avoid decongestants.  Issues to address at subsequent visits: None   Pharmacist comments: Ms Principato is a 56 year old black female presenting to clinic today with her medication list. She reports not taking alprazolam, Restasis or Invega currently because she is in the process of finding a new doctor. She was unsure which ARB she was currently taking; I confirmed with her pharmacy that she is currently taking losartan not valsartan. She also mentions only taking lasix as needed when her feet/ankles are swollen. She has not taken any lasix since last month. She does not have any medication related questions or concerns at this time.    Time with patient: 10 Preparation and documentation time: 2 Total time: 12

## 2017-04-19 NOTE — Progress Notes (Signed)
Patient ID: Tamara Silva, female   DOB: [birth year], 56 y.o.   MRN: [id number]  Primary Physician: Dr. Doreene Burke  HPI: Tamara Silva is a 56 year old with morbid obesity, COPD on home oxygen (stopped smoking 1/15), DM2, CAD with history of PCI at [facility] in 2006 [place] 2009 (likely to the LAD), hypertension, hyperlipidemia, H/O chronic LLL PE 2014 and chronic systolic HF.  Admitted Oct 06, 2013 with STEMI of inferior wall and had Xience Alpine DES 2.75 mm x 15 mm to the RCA. Initially on plavix but P2Y12 test showed poor response and she was switched to Effient. Silva course was complicated by bradycardia. Carvedilol was cut back due to bradycardia.   Today she returns for HF follow up. Complaining of shortness of breath. SOB with exertion. No chest pain. Wears 2 liters oxygen. Not weighing at home. Having trouble getting food and paying bus passes. Says she can read. Lives with her boyfriend. Taking medications except lasix.    Studies  Cardiolite 08/29/2013: Extensive LAD distribution scar with severe systolic dysfunction. No reversible ischemia. EF roughly 28%  LHC 10/07/13 With severe single-vessel disease with distal RCA 99% thrombotic subtotal occlusion, successful PCI of the distal RCA with a Xience Alpine 2.75 mm x 15mm postdilated to 3.1 mm Mild in-stent restenosis in the LAD stent. Dilated Left Ventricle with global hypokinesis and EF of roughly 30%.  ECHO 10/07/13: EF 35-40%, grade II DD , mild MR Echo 2/16: EF 40-45%, apical hypokinesis ECHO 2018 EF 35-40% Grade IIDD>   ROS: All systems negative except as listed in HPI, PMH and Problem List.  SH:  Social History   Socioeconomic History  . Marital status: Single    Spouse name: Not on file  . Number of children: 1  . Years of education: Not on file  . Highest education level: Not on file  Social Needs  . Financial resource strain: Not on file  . Food insecurity - worry: Not on file  . Food insecurity - inability: Not on  file  . Transportation needs - medical: Not on file  . Transportation needs - non-medical: Not on file  Occupational History  . Not on file  Tobacco Use  . Smoking status: Former Smoker    Packs/day: 0.50    Years: 20.00    Pack years: 10.00    Types: Cigarettes    Last attempt to quit: 04/30/2014    Years since quitting: 2.9  . Smokeless tobacco: Never Used  . Tobacco comment: smoked off and on x 20 years  Substance and Sexual Activity  . Alcohol use: No    Alcohol/week: 0.0 oz  . Drug use: No  . Sexual activity: No  Other Topics Concern  . Not on file  Social History Narrative   Origibnally from [place]   Most recently from [place]   Daughter lives in town      On Social security to CHF, COPD, CAD    FH:  Family History  Problem Relation Age of Onset  . Stroke Mother   . Hypertension Mother   . Colon cancer Father   . Diabetes Father   . Heart attack Father   . Sarcoidosis Other   . Breast cancer Paternal Aunt   . Emphysema Brother   . Lung disease Brother        Unknown type, 3 brothers, one with liver and lung disease  . Asthma Paternal Aunt     Past Medical History:  Diagnosis  Date  . Asthma   . Diabetes mellitus without complication ([place] Silva)     Current Outpatient Medications  Medication Sig Dispense Refill  . ACCU-CHEK AVIVA PLUS test strip USE 1 STRIP TO CHECK GLUCOSE 3 TIMES DAILY 100 each 9  . ACCU-CHEK FASTCLIX LANCETS MISC 1 applicator by Does not apply route 4 (four) times daily -  before meals and at bedtime. 100 each 11  . ACCU-CHEK SOFTCLIX LANCETS lancets USE 1 LANCET TO CHECK GLUCOSE 3 TIMES DAILY 100 each 10  . aspirin 81 MG tablet Take 1 tablet (81 mg total) by mouth daily.    . Blood Glucose Monitoring Suppl (ACCU-CHEK AVIVA) device Use as instructed 3 times daily before meals. 1 each 0  . carvedilol (COREG) 12.5 MG tablet TAKE 1 TABLET BY MOUTH 2 TIMES A DAY WITH A MEAL. 60 tablet 1  . EASY TOUCH PEN NEEDLES 31G X 8 MM MISC USE AS  DIRECTED TO INJECT INSULIN 100 each 10  . fluticasone (FLONASE) 50 MCG/ACT nasal spray INSTILL 2 SPRAYS IN EACH NOSTRIL TWICE DAILY. 1 g 1  . furosemide (LASIX) 40 MG tablet TAKE 2 TABLETS BY MOUTH TWICE DAILY. 120 tablet 2  . INSULIN SYRINGE .5CC/29G 29G X 1/2" 0.5 ML MISC Use as instructed by physician 100 each 12  . ipratropium-albuterol (DUONEB) 0.5-2.5 (3) MG/3ML SOLN INHALE THE CONTENTS OF 1 VIAL VIA NEBULIZER EVERY 6 HOURS AS NEEDED 360 mL 0  . LEVEMIR 100 UNIT/ML injection INJECT 45 UNITS SUBCUTANEOUSLY EVERY NIGHT AT BEDTIME *MUST HAVE OFFICE VISIT FOR REFILLS* 20 mL 1  . nitroGLYCERIN (NITROSTAT) 0.4 MG SL tablet Place 1 tablet (0.4 mg total) under the tongue every 5 (five) minutes as needed for chest pain. 25 tablet 11  . NOVOLOG FLEXPEN 100 UNIT/ML FlexPen INJECT 15-20 UNITS SUBCUTANEOUSLY 3 TIMES A DAY WITH MEALS. 30 mL 2  . omeprazole (PRILOSEC) 40 MG capsule TAKE 1 CAPSULE BY MOUTH TWICE DAILY. 60 capsule 1  . prasugrel (EFFIENT) 10 MG TABS tablet Take 1 tablet (10 mg total) by mouth daily. 90 tablet 3  . prazosin (MINIPRESS) 5 MG capsule TAKE 1 CAPSULE BY MOUTH EVERY NIGHT AT BEDTIME FOR NIGHTMARES. 30 capsule 1  . PROAIR HFA 108 (90 Base) MCG/ACT inhaler INHALE 2 PUFFS BY MOUTH 4 TIMES DAILY AS NEEDED 8.5 each 0  . rosuvastatin (CRESTOR) 10 MG tablet Take 1 tablet (10 mg total) by mouth daily. 90 tablet 3  . spironolactone (ALDACTONE) 25 MG tablet Take 1 tablet (25 mg total) by mouth daily. 90 tablet 3  . umeclidinium-vilanterol (ANORO ELLIPTA) 62.5-25 MCG/INH AEPB Inhale 1 puff daily into the lungs. 1 each 2  . ALPRAZolam (XANAX) 1 MG tablet Take 1 mg by mouth at bedtime as needed for anxiety.    . cycloSPORINE (RESTASIS) 0.05 % ophthalmic emulsion Place 2 drops into both eyes 2 (two) times daily as needed (for dry eyes).     . Paliperidone Palmitate (INVEGA SUSTENNA) 117 MG/0.75ML SUSP Inject 156 mg into the muscle every 3 (three) months.     . valsartan (DIOVAN) 80 MG tablet  Take 1 tablet (80 mg total) by mouth daily. (Patient not taking: Reported on 04/19/2017) 90 tablet 3   No current facility-administered medications for this encounter.     Vitals:   04/19/17 1447  BP: 134/62  Pulse: 71  SpO2: 93%  Weight: 276 lb 6.4 oz (125.4 kg)   Filed Weights   04/19/17 1447  Weight: 276 lb 6.4 oz (125.4 kg)  PHYSICAL EXAM: General:  Well appearing. No resp difficulty. Arrived in a wheelchair.  HEENT: normal Neck: supple. Difficult to assess due to body habitus. Carotids 2+ bilat; no bruits. No lymphadenopathy or thryomegaly appreciated. Cor: PMI nondisplaced. Regular rate & rhythm. No rubs, gallops or murmurs. Lungs: EW on 2 liters  Abdomen: soft, nontender, nondistended. No hepatosplenomegaly. No bruits or masses. Good bowel sounds. Extremities: no cyanosis, clubbing, rash, R and LLE trace edema.  Neuro: alert & orientedx3, cranial nerves grossly intact. moves all 4 extremities w/o difficulty. Affect pleasant  ASSESSMENT & PLAN:  1) Chronic systolic heart failure: Ischemic cardiomyopathy,  ECHO ef 35-40%  NYHA III. Volume status elevated. I have asked her to take lasix 40 mg daily. She has not been taking any lasix I will start with 40 mg daily.  - Continue current Coreg and spironolactone.   - on valsartan 80 mg daily.   - QRS is narrow, she would not be CRT candidate.   2) COPD: Using oxygen. Not smoking.   3) OSA: Using CPAP.   4) HTN:  BP controlled.  5) CAD: s/p DES (09/2013) after inferior STEMI.  -Continue effient. Non responder to plavix.  - Continue ASA 81 daily.  - Continue crestor 10 mg daily.    Today I am concerned about the many social barriers. No food and no money for transportation.  I have referred to Paramedicine and HFSW. She was provided with box of food today.   No med changes because I am not sure what sure taking.   I have asked her to bring all medications to the clinic next visit.   Follow up in 4 weeks.   Orphia Mctigue  NP-C  04/19/2017

## 2017-04-20 ENCOUNTER — Telehealth (HOSPITAL_COMMUNITY): Payer: Self-pay | Admitting: Surgery

## 2017-04-20 NOTE — Telephone Encounter (Signed)
Patient has been referred to the HF [facility].  I have sent all appropriate paperwork via secure email to the paramedic team.  

## 2017-04-20 NOTE — Progress Notes (Signed)
Heart Failure Clinic Social Work Assessment  Farrie Heaps  presents today in association with an [facility] Appointment.  Patient seen today by CSW for assistance on Financial difficulties and/or  Food and Transportation   Social Determinants impacting successful heart failure regimen:  Housing: patient lives with her SO friend [name]. Food: Do you have enough food? No- Patient reports she gets $59 in food stamps and her SO receives $82.  Do you know and understand healthy eating and how that affects your heart failure diagnosis? "sort of" Do you follow a low salt diet?  Patient is struggling to obtain food so eats what ever is available. Utilities: Do you have gas and/or electricity on in your home? Yes  Income: What is your source of income? SSI Insurance: Medicaid Transportation: Do you have transportation to your medical appointments? No  Patient shared she is aware of medicaid transport although often she has to wait an hour or more before and after her appointments and calling more than 3-4 days ahead of time to schedule. She states she has [employer] although doesn't always have the $1.50 fare. Patient provided bus passes to hopefully assist with some transport. Patient transported home from clinic by taxi.    Do you have any identified obstacles / challenges for adherence to current treatment plan?Yes  Food and transportation  Patient appears to be struggling financially which is impacting her health significantly.   CSW assisted patient with [employer] and Supportive Counseling with the goal to Increase adequate support systems for patient/family and Increase motivation to adhere to plan of care  Patient provided with transport resources as well as a box of food through a community food program. Patient also referred to the [employer] for additional follow up.   Heart Failure Clinic Social Worker will continue to coordinate and monitor  patient's treatment plan as needed.  CSW continues to be available for identified needs. Raquel Sarna, [place], [place]

## 2017-04-20 NOTE — Addendum Note (Signed)
Encounter addended by: Louann Liv, LCSW on: 04/20/2017 8:10 AM  Actions taken: Sign clinical note

## 2017-04-27 ENCOUNTER — Telehealth (HOSPITAL_COMMUNITY): Payer: Self-pay | Admitting: Pharmacist

## 2017-04-27 ENCOUNTER — Other Ambulatory Visit (HOSPITAL_COMMUNITY): Payer: Self-pay

## 2017-04-27 MED ORDER — LOSARTAN POTASSIUM 50 MG PO TABS: 50.0000 mg | ORAL_TABLET | Freq: Every day | ORAL | 5 refills | Status: DC

## 2017-04-27 NOTE — Progress Notes (Signed)
Paramedicine Encounter    Patient ID: Tamara Silva, female    DOB: September 13, 1960, 56 y.o.   MRN: [id number]   Patient Care Team: Tresa Garter, MD as PCP - General (Internal Medicine)  Patient Active Problem List   Diagnosis Date Noted  . Dyslipidemia 04/29/2016  . Primary insomnia 04/29/2016  . Hypoxemia 09/08/2015  . CHF (congestive heart failure) ([place]) 09/08/2015  . Obesity hypoventilation syndrome ([place])   . Microcytic anemia   . Acute encephalopathy   . Uncontrolled type 2 diabetes mellitus with hyperosmolarity without coma ([place])   . Acute on chronic respiratory failure with hypercapnia ([place])   . CAP (community acquired pneumonia)   . OSA (obstructive sleep apnea)   . Acute on chronic respiratory failure ([place]) 06/22/2015  . Acute on chronic combined systolic (congestive) and diastolic (congestive) heart failure ([place]) 06/21/2015  . Hypoglycemia 06/21/2015  . Uncontrolled type 2 diabetes mellitus without complication, with long-term current use of insulin ([place])   . Dyslipidemia associated with type 2 diabetes mellitus ([place]) 05/21/2015  . Anemia of chronic kidney failure 05/21/2015  . Hyperkalemia 05/21/2015  . COPD GOLD III  06/10/2014  . Acute respiratory failure with hypoxia ([place]) 03/04/2014  . CKD (chronic kidney disease), stage II 02/07/2014  . COPD exacerbation ([place]) 02/01/2014  . CAD S/P percutaneous coronary angioplasty - prior PCI to LAD; RCA PCI: new Xience Alpine DES 2.75 mm x 15 mm  10/07/2013  . Morbid obesity due to excess calories ([place]) 07/30/2013  . Chronic combined systolic and diastolic congestive heart failure, NYHA class 2; EF 30-35% 07/10/2013  . Chronic pulmonary embolism ([place]) 02/08/2013  . Bipolar disorder ([place]) 02/08/2013  . Essential hypertension 02/08/2013  . Chronic anticoagulation 11/27/2012    Current Outpatient Medications:  .  ACCU-CHEK AVIVA PLUS test strip, USE 1 STRIP TO CHECK GLUCOSE 3 TIMES DAILY, Disp: 100 each, Rfl: 9 .   ACCU-CHEK FASTCLIX LANCETS MISC, 1 applicator by Does not apply route 4 (four) times daily -  before meals and at bedtime., Disp: 100 each, Rfl: 11 .  ACCU-CHEK SOFTCLIX LANCETS lancets, USE 1 LANCET TO CHECK GLUCOSE 3 TIMES DAILY, Disp: 100 each, Rfl: 10 .  aspirin 81 MG tablet, Take 1 tablet (81 mg total) by mouth daily., Disp: , Rfl:  .  Blood Glucose Monitoring Suppl (ACCU-CHEK AVIVA) device, Use as instructed 3 times daily before meals., Disp: 1 each, Rfl: 0 .  carvedilol (COREG) 12.5 MG tablet, TAKE 1 TABLET BY MOUTH 2 TIMES A DAY WITH A MEAL., Disp: 60 tablet, Rfl: 1 .  EASY TOUCH PEN NEEDLES 31G X 8 MM MISC, USE AS DIRECTED TO INJECT INSULIN, Disp: 100 each, Rfl: 10 .  fluticasone (FLONASE) 50 MCG/ACT nasal spray, INSTILL 2 SPRAYS IN EACH NOSTRIL TWICE DAILY., Disp: 1 g, Rfl: 1 .  furosemide (LASIX) 40 MG tablet, Take 1 tablet (40 mg total) by mouth daily., Disp: 90 tablet, Rfl: 3 .  INSULIN SYRINGE .5CC/29G 29G X 1/2" 0.5 ML MISC, Use as instructed by physician, Disp: 100 each, Rfl: 12 .  ipratropium-albuterol (DUONEB) 0.5-2.5 (3) MG/3ML SOLN, INHALE THE CONTENTS OF 1 VIAL VIA NEBULIZER EVERY 6 HOURS AS NEEDED, Disp: 360 mL, Rfl: 0 .  LEVEMIR 100 UNIT/ML injection, INJECT 45 UNITS SUBCUTANEOUSLY EVERY NIGHT AT BEDTIME *MUST HAVE OFFICE VISIT FOR REFILLS*, Disp: 20 mL, Rfl: 1 .  NOVOLOG FLEXPEN 100 UNIT/ML FlexPen, INJECT 15-20 UNITS SUBCUTANEOUSLY 3 TIMES A DAY WITH MEALS., Disp: 30 mL, Rfl: 2 .  omeprazole (PRILOSEC)  40 MG capsule, TAKE 1 CAPSULE BY MOUTH TWICE DAILY., Disp: 60 capsule, Rfl: 1 .  prasugrel (EFFIENT) 10 MG TABS tablet, Take 1 tablet (10 mg total) by mouth daily., Disp: 90 tablet, Rfl: 3 .  prazosin (MINIPRESS) 5 MG capsule, TAKE 1 CAPSULE BY MOUTH EVERY NIGHT AT BEDTIME FOR NIGHTMARES., Disp: 30 capsule, Rfl: 1 .  PROAIR HFA 108 (90 Base) MCG/ACT inhaler, INHALE 2 PUFFS BY MOUTH 4 TIMES DAILY AS NEEDED, Disp: 8.5 each, Rfl: 0 .  rosuvastatin (CRESTOR) 10 MG tablet, Take 1  tablet (10 mg total) by mouth daily., Disp: 90 tablet, Rfl: 3 .  spironolactone (ALDACTONE) 25 MG tablet, Take 1 tablet (25 mg total) by mouth daily., Disp: 90 tablet, Rfl: 3 .  umeclidinium-vilanterol (ANORO ELLIPTA) 62.5-25 MCG/INH AEPB, Inhale 1 puff daily into the lungs., Disp: 1 each, Rfl: 2 .  ALPRAZolam (XANAX) 1 MG tablet, Take 1 mg by mouth at bedtime as needed for anxiety., Disp: , Rfl:  .  cycloSPORINE (RESTASIS) 0.05 % ophthalmic emulsion, Place 2 drops into both eyes 2 (two) times daily as needed (for dry eyes). , Disp: , Rfl:  .  losartan (COZAAR) 50 MG tablet, Take 50 mg by mouth daily., Disp: , Rfl:  .  nitroGLYCERIN (NITROSTAT) 0.4 MG SL tablet, Place 1 tablet (0.4 mg total) under the tongue every 5 (five) minutes as needed for chest pain. (Patient not taking: Reported on 04/27/2017), Disp: 25 tablet, Rfl: 11 .  Paliperidone Palmitate (INVEGA SUSTENNA) 117 MG/0.75ML SUSP, Inject 156 mg into the muscle every 3 (three) months. , Disp: , Rfl:  Allergies  Allergen Reactions  . Metolazone Other (See Comments)    Dizziness and falling  . Plavix [Clopidogrel Bisulfate] Other (See Comments)    High PRU's - non-responder  . Nsaids Other (See Comments)    "I do not take NSAIDs d/t interfering with other meds"     Social History   Socioeconomic History  . Marital status: Single    Spouse name: Not on file  . Number of children: 1  . Years of education: Not on file  . Highest education level: Not on file  Social Needs  . Financial resource strain: Not on file  . Food insecurity - worry: Not on file  . Food insecurity - inability: Not on file  . Transportation needs - medical: Not on file  . Transportation needs - non-medical: Not on file  Occupational History  . Not on file  Tobacco Use  . Smoking status: Former Smoker    Packs/day: 0.50    Years: 20.00    Pack years: 10.00    Types: Cigarettes    Last attempt to quit: 04/30/2014    Years since quitting: 2.9  .  Smokeless tobacco: Never Used  . Tobacco comment: smoked off and on x 20 years  Substance and Sexual Activity  . Alcohol use: No    Alcohol/week: 0.0 oz  . Drug use: No  . Sexual activity: No  Other Topics Concern  . Not on file  Social History Narrative   Origibnally from [place]   Most recently from [place]   Daughter lives in town      On Social security to CHF, COPD, CAD    Physical Exam  SAFE - 04/27/17 1100      Situation   Heart failure history  Exisiting    Comorbidities  CKD/renal insufficiency;COPD;DM;HTN;Hx MI/CAD      Assessment   Lives alone  No  boyfriend   boyfriend   Primary support person  boyfriend     Mode of transportation  needs assistance;other    Assistance needed  food/transportation    Other services involved  None    Home equipement  Cane;Home O2;Scale      Weight   Weighs self daily  No    Scale provided  No    Records on weight chart  No      Resources   Has "Living better w/heart failure" book  No    Has HF Zone tool  No    Able to identify yellow zone signs/when to call MD  Yes    Records zone daily  No      Medications   Uses a pill box  Yes    Who stocks the pill box  self    Pill box checked this visit  Yes    Pill box refilled this visit  No    Difficulty obtaining medications  No    Mail order medications  Yes    New medications at home today  No    Missed one or more doses of medications per week  Yes    How many missed doses this week  0      Nutrition   Patient receives meals on wheels  No    Patient follows low sodium diet  No    Has foods at home that meet the current recommended diet  No    Patient follows low sugar/card diet  Yes    Nutritional concerns/issues  yes      Activity Level   ADL's/Mobility  Independent      Urine   Difficulty urinating  No    Changes in urine  None         Future Appointments  Date Time Provider [place]  05/19/2017  3:00 PM [facility] PA/NP [facility] None   BP (!)  150/84   Pulse 84   LMP 07/28/2013 Comment: perimenopausal Weight yesterday-? Last visit weight-276 @ clinic [facility] PTA-[id number]   This is first visit with pt at the home. She was lying in bed when I arrived with her cpap mask on--she said she was going to sleep.  She lives with her boyfriend. She states she needs assistance with transportation-she used an agency-unknown who that is-said she was out of points. She reports no food in the home. She debates on taking her meds due to lack of food. She does have medicaid/SSI. She gets $40 a month in food stamps and her boyfriend gets about the same. She doesn't weigh daily.  We talked about weighing daily and the importance of doing so. She states the rent is affordable and tnhe power bill isnt outstanding and doesn't need help with that. She has [facility] order meds.  At first she said she missed meds due to not having any food but then she said today she is thinking of not taking her meds due to no food and when asked why would she not take them due to that and she advised her meds upset her stomach a little bit. She doesn't use salt to add on food but her meals are packaged and eats oodles and noodles and I advised her of the sodium content and she said she uses only a little bit.  During our visit she began to fill her pill box up.  Her lasix she has been doing 80mg  daily not the 40mg  listed.  She states the 40mg  of lasix will not do nothing for her. She does not have losartan. She is asking for help for food. I advised her let me see what I can do and I cant make any promises but let me look into resources. I told her routinely this can not be done and she is looking for help with today. She seemed to get a little irritated about that. She has old baseboard heat and it doesn't work too well and its cold in the house. She uses a space heater in the bedroom. She reports that she gets sob with just walking to bathroom and back. She has a wheeze to her  lungs-she used her inhaler once today and she hadnt used her ellipta inhaler yet and she is using it during our visit. She had a bowl of fruit last night for dinner and bowl of fruit for breakfast.   ACTION: Home visit completed  Marylouise Stacks, EMT-Paramedic 04/27/17

## 2017-04-27 NOTE — Telephone Encounter (Signed)
Received a message from [place] (paramedic) that [name] has not refilled her losartan. Have sent a refill to [facility] and advised her to continue this medication.   Ruta Hinds. Velva Harman, PharmD, BCPS, CPP Clinical Pharmacist Pager: [phone] Phone: [phone] 04/27/2017 3:50 PM

## 2017-05-04 ENCOUNTER — Other Ambulatory Visit (HOSPITAL_COMMUNITY): Payer: Self-pay

## 2017-05-04 NOTE — Progress Notes (Signed)
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 Hope to catch her next week.

## 2017-05-05 ENCOUNTER — Other Ambulatory Visit: Payer: Self-pay | Admitting: Internal Medicine

## 2017-05-19 ENCOUNTER — Other Ambulatory Visit (HOSPITAL_COMMUNITY): Payer: Self-pay

## 2017-05-19 ENCOUNTER — Ambulatory Visit (HOSPITAL_COMMUNITY)
Admission: RE | Admit: 2017-05-19 | Discharge: 2017-05-19 | Disposition: A | Discharge status: Home / Self Care | Payer: Medicaid Other | Source: Ambulatory Visit | Attending: Cardiology | Admitting: Cardiology

## 2017-05-19 VITALS — BP 112/56 | HR 64 | Wt 273.1 lb

## 2017-05-19 DIAGNOSIS — E785 Hyperlipidemia, unspecified: Secondary | ICD-10-CM | POA: Diagnosis not present

## 2017-05-19 DIAGNOSIS — I255 Ischemic cardiomyopathy: Secondary | ICD-10-CM | POA: Insufficient documentation

## 2017-05-19 DIAGNOSIS — I5022 Chronic systolic (congestive) heart failure: Secondary | ICD-10-CM | POA: Diagnosis present

## 2017-05-19 DIAGNOSIS — I5042 Chronic combined systolic (congestive) and diastolic (congestive) heart failure: Secondary | ICD-10-CM

## 2017-05-19 DIAGNOSIS — I110 Hypertensive heart disease with heart failure: Secondary | ICD-10-CM | POA: Diagnosis not present

## 2017-05-19 DIAGNOSIS — Z79899 Other long term (current) drug therapy: Secondary | ICD-10-CM | POA: Insufficient documentation

## 2017-05-19 DIAGNOSIS — Z7982 Long term (current) use of aspirin: Secondary | ICD-10-CM | POA: Diagnosis not present

## 2017-05-19 DIAGNOSIS — E119 Type 2 diabetes mellitus without complications: Secondary | ICD-10-CM | POA: Diagnosis not present

## 2017-05-19 DIAGNOSIS — Z955 Presence of coronary angioplasty implant and graft: Secondary | ICD-10-CM | POA: Insufficient documentation

## 2017-05-19 DIAGNOSIS — I252 Old myocardial infarction: Secondary | ICD-10-CM | POA: Diagnosis not present

## 2017-05-19 DIAGNOSIS — Z87891 Personal history of nicotine dependence: Secondary | ICD-10-CM | POA: Diagnosis not present

## 2017-05-19 DIAGNOSIS — Z9981 Dependence on supplemental oxygen: Secondary | ICD-10-CM | POA: Diagnosis not present

## 2017-05-19 DIAGNOSIS — I10 Essential (primary) hypertension: Secondary | ICD-10-CM

## 2017-05-19 DIAGNOSIS — Z9861 Coronary angioplasty status: Secondary | ICD-10-CM

## 2017-05-19 DIAGNOSIS — G4733 Obstructive sleep apnea (adult) (pediatric): Secondary | ICD-10-CM | POA: Insufficient documentation

## 2017-05-19 DIAGNOSIS — I2510 Atherosclerotic heart disease of native coronary artery without angina pectoris: Secondary | ICD-10-CM | POA: Insufficient documentation

## 2017-05-19 DIAGNOSIS — J449 Chronic obstructive pulmonary disease, unspecified: Secondary | ICD-10-CM | POA: Insufficient documentation

## 2017-05-19 DIAGNOSIS — Z86711 Personal history of pulmonary embolism: Secondary | ICD-10-CM | POA: Insufficient documentation

## 2017-05-19 NOTE — Patient Instructions (Signed)
No changes to medication at this time.  No lab work today.  Follow up 6 weeks with Amy Clegg NP-C.  Take all medication as prescribed the day of your appointment. Bring all medications with you to your appointment.  Do the following things EVERYDAY: 1) Weigh yourself in the morning before breakfast. Write it down and keep it in a log. 2) Take your medicines as prescribed 3) Eat low salt foods-Limit salt (sodium) to 2000 mg per day.  4) Stay as active as you can everyday 5) Limit all fluids for the day to less than 2 liters

## 2017-05-19 NOTE — Progress Notes (Signed)
Patient ID: Tamara Silva, female   DOB: 1960/06/18, 56 y.o.   MRN: [id number]  Primary Physician: Dr. Doreene Burke  HPI: Tamara Silva is a 56 year old with morbid obesity, COPD on home oxygen (stopped smoking 1/15), DM2, CAD with history of PCI at [facility] in 2006 [facility] 2009 (likely to the LAD), hypertension, hyperlipidemia, H/O chronic LLL PE 2014 and chronic systolic HF.  Admitted May 57, 2015 with STEMI of inferior wall and had Xience Alpine DES 2.75 mm x 15 mm to the RCA. Initially on plavix but P2Y12 test showed poor response and she was switched to Effient. Hospital course was complicated by bradycardia. Carvedilol was cut back due to bradycardia.   Today she returns for HF follow up with her friend. Paramedicine present for the visit. Overall feeling ok bur frustrated about her living situation.  Denies SOB/PND/Orthopnea.No chest pain.  Appetite ok. No fever or chills. Weight at home 267-269 pounds. Taking all medications. Requires assistance with transportation. Having difficulty getting food.   Studies  Cardiolite 08/29/2013: Extensive LAD distribution scar with severe systolic dysfunction. No reversible ischemia. EF roughly 28%  LHC 10/07/13 With severe single-vessel disease with distal RCA 99% thrombotic subtotal occlusion, successful PCI of the distal RCA with a Xience Alpine 2.75 mm x 9mm postdilated to 3.1 mm Mild in-stent restenosis in the LAD stent. Dilated Left Ventricle with global hypokinesis and EF of roughly 30%.  ECHO 10/07/13: EF 35-40%, grade II DD , mild MR Echo 2/16: EF 40-45%, apical hypokinesis ECHO 2018 EF 35-40% Grade IIDD>   ROS: All systems negative except as listed in HPI, PMH and Problem List.  SH:  Social History   Socioeconomic History  . Marital status: Single    Spouse name: Not on file  . Number of children: 1  . Years of education: Not on file  . Highest education level: Not on file  Social Needs  . Financial resource strain: Not on file  . Food  insecurity - worry: Not on file  . Food insecurity - inability: Not on file  . Transportation needs - medical: Not on file  . Transportation needs - non-medical: Not on file  Occupational History  . Not on file  Tobacco Use  . Smoking status: Former Smoker    Packs/day: 0.50    Years: 20.00    Pack years: 10.00    Types: Cigarettes    Last attempt to quit: 04/30/2014    Years since quitting: 3.0  . Smokeless tobacco: Never Used  . Tobacco comment: smoked off and on x 20 years  Substance and Sexual Activity  . Alcohol use: No    Alcohol/week: 0.0 oz  . Drug use: No  . Sexual activity: No  Other Topics Concern  . Not on file  Social History Narrative   Origibnally from [place]   Most recently from [place]   Daughter lives in town      On Social security to CHF, COPD, CAD    FH:  Family History  Problem Relation Age of Onset  . Stroke Mother   . Hypertension Mother   . Colon cancer Father   . Diabetes Father   . Heart attack Father   . Sarcoidosis Other   . Breast cancer Paternal Aunt   . Emphysema Brother   . Lung disease Brother        Unknown type, 3 brothers, one with liver and lung disease  . Asthma Paternal Aunt  Past Medical History:  Diagnosis Date  . Asthma   . Diabetes mellitus without complication ([place])     Current Outpatient Medications  Medication Sig Dispense Refill  . ACCU-CHEK AVIVA PLUS test strip USE 1 STRIP TO CHECK GLUCOSE 3 TIMES DAILY 100 each 9  . ACCU-CHEK FASTCLIX LANCETS MISC 1 applicator by Does not apply route 4 (four) times daily -  before meals and at bedtime. 100 each 11  . ACCU-CHEK SOFTCLIX LANCETS lancets USE 1 LANCET TO CHECK GLUCOSE 3 TIMES DAILY 100 each 10  . ALPRAZolam (XANAX) 1 MG tablet Take 1 mg by mouth at bedtime as needed for anxiety.    [name] Kitchen aspirin 81 MG tablet Take 1 tablet (81 mg total) by mouth daily.    . Blood Glucose Monitoring Suppl (ACCU-CHEK AVIVA) device Use as instructed 3 times daily before meals. 1  each 0  . carvedilol (COREG) 12.5 MG tablet TAKE 1 TABLET BY MOUTH 2 TIMES A DAY WITH A MEAL. 60 tablet 1  . cycloSPORINE (RESTASIS) 0.05 % ophthalmic emulsion Place 2 drops into both eyes 2 (two) times daily as needed (for dry eyes).     [name] Kitchen EASY TOUCH PEN NEEDLES 31G X 8 MM MISC USE AS DIRECTED TO INJECT INSULIN 100 each 10  . fluticasone (FLONASE) 50 MCG/ACT nasal spray INSTILL 2 SPRAYS IN EACH NOSTRIL TWICE DAILY. 1 g 1  . furosemide (LASIX) 40 MG tablet Take 80 mg by mouth daily.    . INSULIN SYRINGE .5CC/29G 29G X 1/2" 0.5 ML MISC Use as instructed by physician 100 each 12  . ipratropium-albuterol (DUONEB) 0.5-2.5 (3) MG/3ML SOLN INHALE THE CONTENTS OF 1 VIAL VIA NEBULIZER EVERY 6 HOURS AS NEEDED 360 mL 0  . LEVEMIR 100 UNIT/ML injection INJECT 45 UNITS SUBCUTANEOUSLY EVERY NIGHT AT BEDTIME *MUST HAVE OFFICE VISIT FOR REFILLS* 20 mL 1  . losartan (COZAAR) 50 MG tablet Take 1 tablet (50 mg total) by mouth daily. 30 tablet 5  . nitroGLYCERIN (NITROSTAT) 0.4 MG SL tablet Place 1 tablet (0.4 mg total) under the tongue every 5 (five) minutes as needed for chest pain. 25 tablet 11  . NOVOLOG FLEXPEN 100 UNIT/ML FlexPen INJECT 15-20 UNITS SUBCUTANEOUSLY 3 TIMES A DAY WITH MEALS. 15 mL 0  . omeprazole (PRILOSEC) 40 MG capsule TAKE 1 CAPSULE BY MOUTH TWICE DAILY. 60 capsule 1  . Paliperidone Palmitate (INVEGA SUSTENNA) 117 MG/0.75ML SUSP Inject 156 mg into the muscle every 3 (three) months.     . prasugrel (EFFIENT) 10 MG TABS tablet Take 1 tablet (10 mg total) by mouth daily. 90 tablet 3  . prazosin (MINIPRESS) 5 MG capsule TAKE 1 CAPSULE BY MOUTH EVERY NIGHT AT BEDTIME FOR NIGHTMARES. 30 capsule 1  . PROAIR HFA 108 (90 Base) MCG/ACT inhaler INHALE 2 PUFFS BY MOUTH 4 TIMES DAILY AS NEEDED 8.5 each 0  . rosuvastatin (CRESTOR) 10 MG tablet Take 1 tablet (10 mg total) by mouth daily. 90 tablet 3  . spironolactone (ALDACTONE) 25 MG tablet Take 1 tablet (25 mg total) by mouth daily. 90 tablet 3  .  umeclidinium-vilanterol (ANORO ELLIPTA) 62.5-25 MCG/INH AEPB Inhale 1 puff daily into the lungs. 1 each 2   No current facility-administered medications for this encounter.     Vitals:   05/19/17 1520  BP: (!) 112/56  Pulse: 64  SpO2: 92%  Weight: 273 lb 2 oz (123.9 kg)   Filed Weights   05/19/17 1520  Weight: 273 lb 2 oz (123.9 kg)  PHYSICAL EXAM: General:  Well appearing. No resp difficulty. Arrived in a wheel chair.  HEENT: normal Neck: supple. JVP 5-6  Carotids 2+ bilat; no bruits. No lymphadenopathy or thryomegaly appreciated. Cor: PMI nondisplaced. Regular rate & rhythm. No rubs, gallops or murmurs. Lungs: clear on 3 liters oxygen.  Abdomen: obese, soft, nontender, nondistended. No hepatosplenomegaly. No bruits or masses. Good bowel sounds. Extremities: no cyanosis, clubbing, rash, edema Neuro: alert & orientedx3, cranial nerves grossly intact. moves all 4 extremities w/o difficulty. Affect pleasant   ASSESSMENT & PLAN: 1) Chronic systolic heart failure: Ischemic cardiomyopathy,  ECHO ef 35-40%  NYHA III. Volume status stable.   -Continue lasix 80 mg daily.  - Continue current Coreg and spironolactone.  She does not want to change medications.  - Continue losartan 50 mg daily. Needs blood work but she refuses.  - QRS is narrow, she would not be CRT candidate.   2) COPD: Using oxygen. Not smoking.   3) OSA: Using CPAP.   4) HTN: controlled.   5) CAD: s/p DES (09/2013) after inferior STEMI. -No s/s ischemia.   -Continue effient. Non responder to plavix.  - Continue ASA 81 daily.  - Continue crestor 10 mg daily.   Follow up  6-8 weeks . Many social barriers with transportation. Has a hard time getting food. Followed by Paramedicine. Refuses lab work.    Wyndi Northrup NP-C  05/19/2017

## 2017-05-19 NOTE — Progress Notes (Signed)
Paramedicine Encounter   Patient ID: Tamara Silva , female,   DOB: [birth year],56 y.o.,  MRN: [id number]   Met patient in clinic today with provider.  Time spent with patient   Weight today @ clinic-273 Pt reports she got the losartan in the mail and has been taking it. She denies any missed doses-- Weight at home 270-275 Transportation and housing/heat is a problem. Maybe she needs more assistance with finding housing to help with some of this problem.    Marylouise Stacks, EMT-Paramedic 05/19/2017   ACTION: Home visit completed

## 2017-05-30 ENCOUNTER — Telehealth (HOSPITAL_COMMUNITY): Payer: Self-pay

## 2017-05-30 NOTE — Telephone Encounter (Signed)
Contacted pt today to sch home visit-she states today she is unavailable as she has to go out and pay bills. Will try again Wednesday or Thursday.   Marylouise Stacks, EMT-Paramedic  05/30/17

## 2017-06-02 ENCOUNTER — Other Ambulatory Visit: Payer: Self-pay | Admitting: Pharmacist

## 2017-06-02 ENCOUNTER — Other Ambulatory Visit: Payer: Self-pay | Admitting: Adult Health

## 2017-06-02 ENCOUNTER — Other Ambulatory Visit: Payer: Self-pay | Admitting: Internal Medicine

## 2017-06-07 ENCOUNTER — Telehealth (HOSPITAL_COMMUNITY): Payer: Self-pay

## 2017-06-07 NOTE — Telephone Encounter (Signed)
Called pt to sch appointment and no one answered. Unable to leave VM.   Marylouise Stacks, EMT-Paramedic 06/07/17

## 2017-06-29 ENCOUNTER — Ambulatory Visit: Payer: Self-pay | Admitting: Internal Medicine

## 2017-06-29 ENCOUNTER — Telehealth (HOSPITAL_COMMUNITY): Payer: Self-pay | Admitting: Surgery

## 2017-06-29 NOTE — Telephone Encounter (Signed)
Patient discharged from the [facility] secondary to inability to reach and make home visits.

## 2017-07-07 ENCOUNTER — Encounter (HOSPITAL_COMMUNITY): Payer: Self-pay

## 2017-07-13 ENCOUNTER — Ambulatory Visit: Payer: Self-pay | Admitting: Family Medicine

## 2017-07-24 ENCOUNTER — Other Ambulatory Visit: Payer: Self-pay

## 2017-07-24 ENCOUNTER — Emergency Department (HOSPITAL_COMMUNITY)
Admission: EM | Admit: 2017-07-24 | Discharge: 2017-07-25 | Disposition: A | Discharge status: Home / Self Care | Payer: Medicaid Other | Attending: Emergency Medicine | Admitting: Emergency Medicine

## 2017-07-24 ENCOUNTER — Encounter (HOSPITAL_COMMUNITY): Payer: Self-pay | Admitting: Emergency Medicine

## 2017-07-24 DIAGNOSIS — Z5321 Procedure and treatment not carried out due to patient leaving prior to being seen by health care provider: Secondary | ICD-10-CM | POA: Insufficient documentation

## 2017-07-24 DIAGNOSIS — R112 Nausea with vomiting, unspecified: Secondary | ICD-10-CM | POA: Insufficient documentation

## 2017-07-24 MED ORDER — ONDANSETRON 4 MG PO TBDP
4.0000 mg | ORAL_TABLET | Freq: Once | ORAL | Status: AC | PRN
  Administered 2017-07-24: 4 mg via ORAL
  Filled 2017-07-24: qty 1

## 2017-07-24 NOTE — ED Notes (Signed)
Called in waiting room for blood draw no answer

## 2017-07-24 NOTE — ED Notes (Signed)
This tech attempted to draw blood on pt and was unsuccessful.

## 2017-07-24 NOTE — ED Notes (Signed)
Pt called in waiting room for E45 no answer in lobby

## 2017-07-24 NOTE — ED Triage Notes (Signed)
Pt arrived EMS from home with reports of nausea and vomiting after having spaghetti sauce a couple days ago. Hx GERD. EMS vitals BP178/103 P80 100%2L [place] (uses at home at baseline) [id number].

## 2017-07-25 ENCOUNTER — Emergency Department (HOSPITAL_COMMUNITY)
Admission: EM | Admit: 2017-07-25 | Discharge: 2017-07-25 | Disposition: A | Discharge status: Home / Self Care | Payer: Medicaid Other | Source: Home / Self Care | Attending: Emergency Medicine | Admitting: Emergency Medicine

## 2017-07-25 ENCOUNTER — Encounter (HOSPITAL_COMMUNITY): Payer: Self-pay | Admitting: Emergency Medicine

## 2017-07-25 ENCOUNTER — Emergency Department (HOSPITAL_COMMUNITY): Payer: Medicaid Other

## 2017-07-25 DIAGNOSIS — B349 Viral infection, unspecified: Secondary | ICD-10-CM

## 2017-07-25 DIAGNOSIS — R112 Nausea with vomiting, unspecified: Secondary | ICD-10-CM

## 2017-07-25 DIAGNOSIS — J441 Chronic obstructive pulmonary disease with (acute) exacerbation: Secondary | ICD-10-CM

## 2017-07-25 LAB — CBC
HCT: 33.4 % — ABNORMAL LOW (ref 36.0–46.0)
Hemoglobin: 9.2 g/dL — ABNORMAL LOW (ref 12.0–15.0)
MCH: 19.8 pg — ABNORMAL LOW (ref 26.0–34.0)
MCHC: 27.5 g/dL — ABNORMAL LOW (ref 30.0–36.0)
MCV: 72 fL — ABNORMAL LOW (ref 78.0–100.0)
Platelets: 355 10*3/uL (ref 150–400)
RBC: 4.64 MIL/uL (ref 3.87–5.11)
RDW: 19.8 % — ABNORMAL HIGH (ref 11.5–15.5)
WBC: 6.3 10*3/uL (ref 4.0–10.5)

## 2017-07-25 LAB — URINALYSIS, ROUTINE W REFLEX MICROSCOPIC
Bilirubin Urine: NEGATIVE
Glucose, UA: 150 mg/dL — AB
Ketones, ur: NEGATIVE mg/dL
LEUKOCYTES UA: NEGATIVE
NITRITE: NEGATIVE
PH: 6 (ref 5.0–8.0)
Protein, ur: >=300 mg/dL — AB
SPECIFIC GRAVITY, URINE: 1.026 (ref 1.005–1.030)

## 2017-07-25 LAB — COMPREHENSIVE METABOLIC PANEL
ALK PHOS: 59 U/L (ref 38–126)
ALT: 11 U/L — ABNORMAL LOW (ref 14–54)
AST: 20 U/L (ref 15–41)
Albumin: 3.2 g/dL — ABNORMAL LOW (ref 3.5–5.0)
Anion gap: 10 (ref 5–15)
BUN: 13 mg/dL (ref 6–20)
CALCIUM: 8.6 mg/dL — AB (ref 8.9–10.3)
CHLORIDE: 90 mmol/L — AB (ref 101–111)
CO2: 29 mmol/L (ref 22–32)
Creatinine, Ser: 0.89 mg/dL (ref 0.44–1.00)
Glucose, Bld: 271 mg/dL — ABNORMAL HIGH (ref 65–99)
Potassium: 4.2 mmol/L (ref 3.5–5.1)
Sodium: 129 mmol/L — ABNORMAL LOW (ref 135–145)
Total Bilirubin: 0.8 mg/dL (ref 0.3–1.2)
Total Protein: 9.1 g/dL — ABNORMAL HIGH (ref 6.5–8.1)

## 2017-07-25 LAB — I-STAT BETA HCG BLOOD, ED (MC, WL, AP ONLY): I-stat hCG, quantitative: <5 m[IU]/mL (ref <5)

## 2017-07-25 LAB — BRAIN NATRIURETIC PEPTIDE: B Natriuretic Peptide: 814 pg/mL — ABNORMAL HIGH (ref 0.0–100.0)

## 2017-07-25 LAB — LIPASE, BLOOD: LIPASE: 30 U/L (ref 11–51)

## 2017-07-25 MED ORDER — AZITHROMYCIN 250 MG PO TABS: 250.0000 mg | ORAL_TABLET | Freq: Every day | ORAL | 0 refills | Status: DC

## 2017-07-25 MED ORDER — IPRATROPIUM-ALBUTEROL 0.5-2.5 (3) MG/3ML IN SOLN
3.0000 mL | Freq: Once | RESPIRATORY_TRACT | Status: AC
  Administered 2017-07-25: 3 mL via RESPIRATORY_TRACT
  Filled 2017-07-25: qty 3

## 2017-07-25 MED ORDER — PROMETHAZINE HCL 25 MG PO TABS
25.0000 mg | ORAL_TABLET | Freq: Once | ORAL | Status: AC
  Administered 2017-07-25: 25 mg via ORAL
  Filled 2017-07-25: qty 1

## 2017-07-25 MED ORDER — SODIUM CHLORIDE 0.9 % IV BOLUS (SEPSIS)
250.0000 mL | Freq: Once | INTRAVENOUS | Status: AC
  Administered 2017-07-25: 250 mL via INTRAVENOUS

## 2017-07-25 MED ORDER — PREDNISONE 10 MG PO TABS: 30.0000 mg | ORAL_TABLET | Freq: Every day | ORAL | 0 refills | Status: AC

## 2017-07-25 MED ORDER — METHYLPREDNISOLONE SODIUM SUCC 125 MG IJ SOLR
125.0000 mg | Freq: Once | INTRAMUSCULAR | Status: AC
  Administered 2017-07-25: 125 mg via INTRAVENOUS
  Filled 2017-07-25: qty 2

## 2017-07-25 MED ORDER — PROMETHAZINE HCL 25 MG PO TABS: 25.0000 mg | ORAL_TABLET | Freq: Four times a day (QID) | ORAL | 0 refills | Status: DC | PRN

## 2017-07-25 MED ORDER — DICYCLOMINE HCL 10 MG/ML IM SOLN
20.0000 mg | Freq: Once | INTRAMUSCULAR | Status: AC
  Administered 2017-07-25: 20 mg via INTRAMUSCULAR
  Filled 2017-07-25: qty 2

## 2017-07-25 NOTE — Discharge Instructions (Signed)
At this time, we feel the most likely cause of your cough and vomiting is a viral infection, with some shortness of breath and wheezing worsened by your underlying COPD. For your COPD, we are giving steroids and an antibiotic. For your nausea we have prescribed phenergan.  I recommend checking your daily weights and following up closely with your primary care physician, as if your shortness of breath worsens I would worry about possible extra fluid developing from CHF.  Recommend recheck of your labwork in a week, return for the ED for worsening symptoms.

## 2017-07-25 NOTE — ED Triage Notes (Signed)
Patient reports having abd pain with n/v x 4 days.

## 2017-07-25 NOTE — ED Provider Notes (Signed)
[facility] DEPT Provider Note   CSN: [id number] Arrival date & time: 07/25/17  1521     History   Chief Complaint Chief Complaint  Patient presents with  . Abdominal Pain  . Emesis    HPI Tamara Silva is a 57 y.o. female.  HPI   57 year old with morbid obesity, COPD on home oxygen (stopped smoking 1/15), DM2, CAD with history of PCI at [place] in 2006 [facility] 2009 (likely to the LAD), hypertension, hyperlipidemia, H/O chronic LLL PE 2014 and chronic systolic HF (last EF 01% 7/51), who presents with concern for cough for 4 days, nausea, and vomiting and diffuse abdominal pain.    Patient reports that she has had a cough productive of white mucus for the last 4 days.  Reports she has had nausea and vomiting, approximately 10 times per day.  Denies diarrhea.  Does report she has had constipation, and took a laxative, and did have some small bowel movements, and has been passing flatus.  Reports she has had abdominal pain which is diffuse, cramping.  No known sick contacts. No body aches. No fevers.  Reports that with the cough she has had shortness of breath, which comes and goes.  Reports she has wheezing that also comes and goes.  Denies orthopnea.  Denies leg swelling.  Reports she has been able to keep down her medications.  Reports she has not recently checked her weights, but would at times. No urinary symptoms.   Past Medical History:  Diagnosis Date  . Asthma   . Diabetes mellitus without complication [facility])     Patient Active Problem List   Diagnosis Date Noted  . Dyslipidemia 04/29/2016  . Primary insomnia 04/29/2016  . Hypoxemia 09/08/2015  . CHF (congestive heart failure) ([place]) 09/08/2015  . Obesity hypoventilation syndrome ([place])   . Microcytic anemia   . Acute encephalopathy   . Uncontrolled type 2 diabetes mellitus with hyperosmolarity without coma ([place])   . Acute on chronic respiratory failure with hypercapnia ([place])   . CAP  (community acquired pneumonia)   . OSA (obstructive sleep apnea)   . Acute on chronic respiratory failure ([place]) 06/22/2015  . Acute on chronic combined systolic (congestive) and diastolic (congestive) heart failure ([place]) 06/21/2015  . Hypoglycemia 06/21/2015  . Uncontrolled type 2 diabetes mellitus without complication, with long-term current use of insulin ([place])   . Dyslipidemia associated with type 2 diabetes mellitus ([place]) 05/21/2015  . Anemia of chronic kidney failure 05/21/2015  . Hyperkalemia 05/21/2015  . COPD GOLD III  06/10/2014  . Acute respiratory failure with hypoxia ([place]) 03/04/2014  . CKD (chronic kidney disease), stage II 02/07/2014  . COPD exacerbation ([place]) 02/01/2014  . CAD S/P percutaneous coronary angioplasty - prior PCI to LAD; RCA PCI: new Xience Alpine DES 2.75 mm x 15 mm  10/07/2013  . Morbid obesity due to excess calories ([place]) 07/30/2013  . Chronic combined systolic and diastolic congestive heart failure, NYHA class 2; EF 30-35% 07/10/2013  . Chronic pulmonary embolism ([place]) 02/08/2013  . Bipolar disorder ([place]) 02/08/2013  . Essential hypertension 02/08/2013  . Chronic anticoagulation 11/27/2012    Past Surgical History:  Procedure Laterality Date  . COLONOSCOPY WITH PROPOFOL N/A 08/03/2013   Procedure: COLONOSCOPY WITH PROPOFOL;  Surgeon: Jeryl Columbia, MD;  Location: [facility] ENDOSCOPY;  Service: Endoscopy;  Laterality: N/A;  . CORONARY ANGIOPLASTY WITH STENT PLACEMENT     CAD in 2006 x 2 and 2009 2 more- place din  REx in [facility] [place] [facility] [place] [facility]  . CORONARY ANGIOPLASTY WITH STENT PLACEMENT  10/07/2013   Xience Alpine DES 2.75  mm x 15  mm  . ESOPHAGOGASTRODUODENOSCOPY (EGD) WITH PROPOFOL N/A 08/03/2013   Procedure: ESOPHAGOGASTRODUODENOSCOPY (EGD) WITH PROPOFOL;  Surgeon: Jeryl Columbia, MD;  Location: [facility] ENDOSCOPY;  Service: Endoscopy;  Laterality: N/A;  . LEFT HEART CATHETERIZATION WITH CORONARY ANGIOGRAM N/A 10/07/2013   Procedure: LEFT HEART CATHETERIZATION WITH  CORONARY ANGIOGRAM;  Surgeon: Leonie Man, MD;  Location: [facility] CATH LAB;  Service: Cardiovascular;  Laterality: N/A;    OB History    No data available       Home Medications    Prior to Admission medications   Medication Sig Start Date End Date Taking? Authorizing Provider  ANORO ELLIPTA 62.5-25 MCG/INH AEPB INHALE 1 PUFF BY MOUTH ONCE DAILY 06/03/17  Yes Parrett, Tammy S, NP  aspirin 81 MG tablet Take 1 tablet (81 mg total) by mouth daily. 10/09/13  Yes Barrett, Evelene Croon, PA-C  carvedilol (COREG) 12.5 MG tablet TAKE 1 TABLET BY MOUTH 2 TIMES A DAY WITH A MEAL. 04/08/17  Yes Jegede, Olugbemiga E, MD  fluticasone (FLONASE) 50 MCG/ACT nasal spray INSTILL 2 SPRAYS IN EACH NOSTRIL TWICE DAILY. 04/08/17  Yes Jegede, Olugbemiga E, MD  LEVEMIR 100 UNIT/ML injection INJECT 45 UNITS SUBCUTANEOUSLY EVERY NIGHT AT BEDTIME *MUST HAVE OFFICE VISIT FOR REFILLS* 04/08/17  Yes Jegede, Olugbemiga E, MD  losartan (COZAAR) 50 MG tablet Take 1 tablet (50 mg total) by mouth daily. 04/27/17  Yes Larey Dresser, MD  NOVOLOG FLEXPEN 100 UNIT/ML FlexPen INJECT 15-20 UNITS SUBCUTANEOUSLY 3 TIMES A DAY WITH MEALS. 05/06/17  Yes Hammer, Aurora Mask, RPH-CPP  omeprazole (PRILOSEC) 40 MG capsule TAKE 1 CAPSULE BY MOUTH TWICE DAILY. 04/08/17  Yes Tresa Garter, MD  prasugrel (EFFIENT) 10 MG TABS tablet Take 1 tablet (10 mg total) by mouth daily. 11/04/16  Yes Larey Dresser, MD  prazosin (MINIPRESS) 5 MG capsule TAKE 1 CAPSULE BY MOUTH EVERY NIGHT AT BEDTIME FOR NIGHTMARES. 04/08/17  Yes Jegede, Olugbemiga E, MD  PROAIR HFA 108 (90 Base) MCG/ACT inhaler INHALE 2 PUFFS BY MOUTH 4 TIMES DAILY AS NEEDED Patient taking differently: INHALE 2 PUFFS BY MOUTH 4 TIMES DAILY AS NEEDED SOB 05/06/17  Yes Jegede, Olugbemiga E, MD  ACCU-CHEK AVIVA PLUS test strip USE 1 STRIP TO CHECK GLUCOSE 3 TIMES DAILY 12/07/16   Tresa Garter, MD  ACCU-CHEK FASTCLIX LANCETS MISC 1 applicator by Does not apply route 4 (four) times daily -   before meals and at bedtime. 08/07/15   Charlott Rakes, MD  ACCU-CHEK SOFTCLIX LANCETS lancets USE 1 LANCET TO CHECK GLUCOSE 3 TIMES DAILY 01/14/16   Tresa Garter, MD  azithromycin (ZITHROMAX) 250 MG tablet Take 1 tablet (250 mg total) by mouth daily. Take first 2 tablets together, then 1 every day until finished. 07/25/17   Gareth Morgan, MD  Blood Glucose Monitoring Suppl (ACCU-CHEK AVIVA) device Use as instructed 3 times daily before meals. 08/06/15   Charlott Rakes, MD  EASY TOUCH PEN NEEDLES 31G X 8 MM MISC USE AS DIRECTED TO INJECT INSULIN 01/14/16   Tresa Garter, MD  furosemide (LASIX) 40 MG tablet Take 40 mg by mouth daily as needed for fluid.     [provider]  INSULIN SYRINGE .5CC/29G 29G X 1/2" 0.5 ML MISC Use as instructed by physician 03/28/14   Tresa Garter, MD  ipratropium-albuterol (DUONEB) 0.5-2.5 (3) MG/3ML SOLN INHALE  THE CONTENTS OF 1 VIAL VIA NEBULIZER EVERY 6 HOURS AS NEEDED Patient taking differently: INHALE THE CONTENTS OF 1 VIAL VIA NEBULIZER EVERY 6 HOURS AS NEEDED SOB AND WHEEZING 05/06/17   Tresa Garter, MD  nitroGLYCERIN (NITROSTAT) 0.4 MG SL tablet Place 1 tablet (0.4 mg total) under the tongue every 5 (five) minutes as needed for chest pain. 08/03/16   Bensimhon, Shaune Pascal, MD  predniSONE (DELTASONE) 10 MG tablet Take 3 tablets (30 mg total) by mouth daily for 3 days. 07/25/17 07/28/17  Gareth Morgan, MD  promethazine (PHENERGAN) 25 MG tablet Take 1 tablet (25 mg total) by mouth every 6 (six) hours as needed for nausea or vomiting. 07/25/17   Gareth Morgan, MD  rosuvastatin (CRESTOR) 10 MG tablet Take 1 tablet (10 mg total) by mouth daily. Patient not taking: Reported on 07/25/2017 07/21/16 04/19/18  Larey Dresser, MD  spironolactone (ALDACTONE) 25 MG tablet Take 1 tablet (25 mg total) by mouth daily. Patient not taking: Reported on 07/25/2017 10/06/16   Tresa Garter, MD    Family History Family History  Problem Relation  Age of Onset  . Stroke Mother   . Hypertension Mother   . Colon cancer Father   . Diabetes Father   . Heart attack Father   . Sarcoidosis Other   . Breast cancer Paternal Aunt   . Emphysema Brother   . Lung disease Brother        Unknown type, 3 brothers, one with liver and lung disease  . Asthma Paternal Aunt     Social History Social History   Tobacco Use  . Smoking status: Former Smoker    Packs/day: 0.50    Years: 20.00    Pack years: 10.00    Types: Cigarettes    Last attempt to quit: 04/30/2014    Years since quitting: 3.2  . Smokeless tobacco: Never Used  . Tobacco comment: smoked off and on x 20 years  Substance Use Topics  . Alcohol use: No    Alcohol/week: 0.0 oz  . Drug use: No     Allergies   Metolazone; Plavix [clopidogrel bisulfate]; and Nsaids   Review of Systems Review of Systems  Constitutional: Positive for fatigue. Negative for fever.  HENT: Negative for sore throat.   Eyes: Negative for visual disturbance.  Respiratory: Positive for cough, shortness of breath (comes and goes) and wheezing.   Cardiovascular: Negative for chest pain.  Gastrointestinal: Positive for abdominal pain, constipation, nausea and vomiting. Negative for diarrhea.  Genitourinary: Negative for difficulty urinating and dysuria.  Musculoskeletal: Negative for back pain and neck pain.  Skin: Negative for rash.  Neurological: Negative for syncope and headaches.     Physical Exam Updated Vital Signs BP (!) 159/85   Pulse 76   Temp 98.1 F (36.7 C) (Oral)   Resp (!) 24   LMP 07/28/2013 Comment: perimenopausal  SpO2 100%   Physical Exam  Constitutional: She is oriented to person, place, and time. She appears well-developed and well-nourished. No distress.  HENT:  Head: Normocephalic and atraumatic.  Eyes: Conjunctivae and EOM are normal.  Neck: Normal range of motion. No JVD present.  Cardiovascular: Normal rate, regular rhythm, normal heart sounds and intact distal  pulses. Exam reveals no gallop and no friction rub.  No murmur heard. Pulmonary/Chest: Effort normal. No respiratory distress. She has no wheezes. She has rales (occasional bibasilar).  Increased exp phase, decreased BS throughout  Abdominal: Soft. She exhibits no distension. There is tenderness (  mild diffuse). There is no guarding.  Musculoskeletal: She exhibits no edema or tenderness.  Neurological: She is alert and oriented to person, place, and time.  Skin: Skin is warm and dry. No rash noted. She is not diaphoretic. No erythema.  Nursing note and vitals reviewed.    ED Treatments / Results  Labs (all labs ordered are listed, but only abnormal results are displayed) Labs Reviewed  COMPREHENSIVE METABOLIC PANEL - Abnormal; Notable for the following components:      Result Value   Sodium 129 (*)    Chloride 90 (*)    Glucose, Bld 271 (*)    Calcium 8.6 (*)    Total Protein 9.1 (*)    Albumin 3.2 (*)    ALT 11 (*)    All other components within normal limits  CBC - Abnormal; Notable for the following components:   Hemoglobin 9.2 (*)    HCT 33.4 (*)    MCV 72.0 (*)    MCH 19.8 (*)    MCHC 27.5 (*)    RDW 19.8 (*)    All other components within normal limits  BRAIN NATRIURETIC PEPTIDE - Abnormal; Notable for the following components:   B Natriuretic Peptide 814.0 (*)    All other components within normal limits  LIPASE, BLOOD  URINALYSIS, ROUTINE W REFLEX MICROSCOPIC  I-STAT BETA HCG BLOOD, ED ([facility], [facility], [facility] ONLY)    EKG  EKG Interpretation  Date/Time:  Monday July 25 2017 15:41:32 EST Ventricular Rate:  76 PR Interval:    QRS Duration: 85 QT Interval:  567 QTC Calculation: 638 R Axis:   -13 Text Interpretation:  Sinus rhythm Consider left atrial enlargement Anterior infarct, old Borderline T abnormalities, inferior leads Prolonged QT interval Baseline wander in lead(s) I No significant change since last tracing Confirmed by Gareth Morgan [phone]) on 07/25/2017  4:20:29 PM       Radiology Dg Chest 2 View  Result Date: 07/25/2017 CLINICAL DATA:  Cough nausea and vomiting EXAM: CHEST  2 VIEW COMPARISON:  07/10/2015, CT chest 06/28/2015 FINDINGS: Cardiomegaly with mild vascular congestion. Diffuse interstitial opacity, could reflect minimal edema or atypical/viral pneumonia. Pleural thickening or small fluid posteriorly with small thickening or fluid along the right fissure. Pleural and parenchymal scarring in the left upper chest. No pneumothorax. IMPRESSION: 1. Cardiomegaly with vascular congestion. Mild diffuse interstitial opacity could relate to minimal edema or possible viral process. 2. Stable scarring in the left hemithorax. Electronically Signed   By: Donavan Foil M.D.   On: 07/25/2017 18:02    Procedures Procedures (including critical care time)  Medications Ordered in ED Medications  ipratropium-albuterol (DUONEB) 0.5-2.5 (3) MG/3ML nebulizer solution 3 mL (3 mLs Nebulization Given 07/25/17 1709)  promethazine (PHENERGAN) tablet 25 mg (25 mg Oral Given 07/25/17 1706)  dicyclomine (BENTYL) injection 20 mg (20 mg Intramuscular Given 07/25/17 1706)  sodium chloride 0.9 % bolus 250 mL (0 mLs Intravenous Stopped 07/25/17 1823)  methylPREDNISolone sodium succinate (SOLU-MEDROL) 125 mg/2 mL injection 125 mg (125 mg Intravenous Given 07/25/17 1706)     Initial Impression / Assessment and Plan / ED Course  I have reviewed the triage vital signs and the nursing notes.  Pertinent labs & imaging results that were available during my care of the patient were reviewed by me and considered in my medical decision making (see chart for details).       57 year old with morbid obesity, COPD on home oxygen (stopped smoking 1/15), DM2, CAD with history of  PCI at [facility] in 2006 [facility] 2009 (likely to the LAD), hypertension, hyperlipidemia, H/O chronic LLL PE 2014 and chronic systolic HF (last EF 08% 1/44), who presents with concern for cough for 4  days, nausea, and vomiting and diffuse abdominal pain.  Patient denies chest pain, EKG does not show any acute changes in comparison to prior.  Given her report of intermittent dyspnea and congestive heart failure history, did order a BNP for further evaluation.    BNP is 800, however she has no other recent values, with most recent 2 years ago being 400, and value before that being 1000.  On exam, patient has a prolonged respiratory phase, diminished breath sounds, and bibasilar crackles.  There is no JVD or lower extremity edema.  Discussed with patient to her BNP of evaluation, however is difficult based off of her history and exam, and no recent prior values to say that this is acute congestive heart failure.  She does not know recent weights.  CXR read as possible viral appearance versus interstitial fluid.   Overall, given her dry mucous membranes, no other signs of fluid overload, normal oxygenation on her home oxygen, primary concern of cough, and significant vomiting--- I feel that patient is more likely intravascularly dry, and dehydrated. Discussed that XR may be consistent with viral process. Patient agrees with this.  She was given 250 cc of normal saline in the emergency department, and reports improvement after that.  She is given Phenergan p.o., without any other emesis.    Regarding her abdominal pain, suspect more likely viral gastritis. No signs of DKA. Having flatus and BM and doubt SBO.  No focal findings to suggest appendicitis, cholecystitis or other. Doubt it is CHF. Hyponatremia likely secondary to dehydration and pt has not had values checked in last 2 years.   At this time, we feel the most likely cause of cough and vomiting is a viral infection, with some shortness of breath and wheezing worsened by underlying COPD. For COPD, we are giving steroids and an antibiotic. For nausea we have prescribed phenergan.  I recommend checking daily weights and following up closely with  primary care physician, as if your shortness of breath worsens I would worry about CHF given elevated BNP today. Recommend recheck of labwork in a week, return for the ED for worsening symptoms.    Final Clinical Impressions(s) / ED Diagnoses   Final diagnoses:  COPD exacerbation ([place])  Viral illness  Non-intractable vomiting with nausea, unspecified vomiting type    ED Discharge Orders        Ordered    azithromycin (ZITHROMAX) 250 MG tablet  Daily     07/25/17 1847    promethazine (PHENERGAN) 25 MG tablet  Every 6 hours PRN     07/25/17 1847    predniSONE (DELTASONE) 10 MG tablet  Daily     07/25/17 1847       Gareth Morgan, MD 07/25/17 1909

## 2017-07-29 ENCOUNTER — Encounter: Payer: Self-pay | Admitting: Adult Health

## 2017-07-29 NOTE — Telephone Encounter (Signed)
Opened in error

## 2017-08-05 ENCOUNTER — Other Ambulatory Visit (HOSPITAL_COMMUNITY): Payer: Self-pay | Admitting: Cardiology

## 2017-08-05 ENCOUNTER — Other Ambulatory Visit: Payer: Self-pay | Admitting: *Deleted

## 2017-08-05 ENCOUNTER — Other Ambulatory Visit: Payer: Self-pay | Admitting: Pharmacist

## 2017-08-05 ENCOUNTER — Other Ambulatory Visit: Payer: Self-pay | Admitting: Internal Medicine

## 2017-08-09 ENCOUNTER — Other Ambulatory Visit: Payer: Self-pay | Admitting: Internal Medicine

## 2017-08-09 ENCOUNTER — Other Ambulatory Visit: Payer: Self-pay | Admitting: Adult Health

## 2017-08-09 MED ORDER — NITROGLYCERIN 0.4 MG SL SUBL: 0.4000 mg | SUBLINGUAL_TABLET | SUBLINGUAL | 11 refills | Status: DC | PRN

## 2017-08-31 ENCOUNTER — Ambulatory Visit: Payer: Self-pay | Admitting: Family Medicine

## 2017-09-12 ENCOUNTER — Emergency Department (HOSPITAL_COMMUNITY): Payer: Medicaid Other

## 2017-09-12 ENCOUNTER — Other Ambulatory Visit: Payer: Self-pay

## 2017-09-12 ENCOUNTER — Emergency Department (HOSPITAL_COMMUNITY)
Admission: EM | Admit: 2017-09-12 | Discharge: 2017-09-12 | Disposition: A | Discharge status: Home / Self Care | Payer: Medicaid Other | Attending: Emergency Medicine | Admitting: Emergency Medicine

## 2017-09-12 ENCOUNTER — Encounter (HOSPITAL_COMMUNITY): Payer: Self-pay | Admitting: Emergency Medicine

## 2017-09-12 DIAGNOSIS — J449 Chronic obstructive pulmonary disease, unspecified: Secondary | ICD-10-CM | POA: Insufficient documentation

## 2017-09-12 DIAGNOSIS — R079 Chest pain, unspecified: Secondary | ICD-10-CM | POA: Insufficient documentation

## 2017-09-12 DIAGNOSIS — Z5321 Procedure and treatment not carried out due to patient leaving prior to being seen by health care provider: Secondary | ICD-10-CM | POA: Insufficient documentation

## 2017-09-12 DIAGNOSIS — R0602 Shortness of breath: Secondary | ICD-10-CM | POA: Insufficient documentation

## 2017-09-12 LAB — BASIC METABOLIC PANEL
Anion gap: 8 (ref 5–15)
BUN: 8 mg/dL (ref 6–20)
CALCIUM: 8.9 mg/dL (ref 8.9–10.3)
CO2: 35 mmol/L — ABNORMAL HIGH (ref 22–32)
Chloride: 95 mmol/L — ABNORMAL LOW (ref 101–111)
Creatinine, Ser: 0.87 mg/dL (ref 0.44–1.00)
Glucose, Bld: 119 mg/dL — ABNORMAL HIGH (ref 65–99)
Potassium: 4.5 mmol/L (ref 3.5–5.1)
Sodium: 138 mmol/L (ref 135–145)

## 2017-09-12 LAB — CBC
HCT: 34.7 % — ABNORMAL LOW (ref 36.0–46.0)
Hemoglobin: 9 g/dL — ABNORMAL LOW (ref 12.0–15.0)
MCH: 18.3 pg — ABNORMAL LOW (ref 26.0–34.0)
MCHC: 25.9 g/dL — ABNORMAL LOW (ref 30.0–36.0)
MCV: 70.5 fL — ABNORMAL LOW (ref 78.0–100.0)
Platelets: 274 10*3/uL (ref 150–400)
RBC: 4.92 MIL/uL (ref 3.87–5.11)
RDW: 21.9 % — ABNORMAL HIGH (ref 11.5–15.5)
WBC: 6.5 10*3/uL (ref 4.0–10.5)

## 2017-09-12 LAB — I-STAT TROPONIN, ED: TROPONIN I, POC: 0 ng/mL (ref 0.00–0.08)

## 2017-09-12 LAB — I-STAT BETA HCG BLOOD, ED (MC, WL, AP ONLY): I-stat hCG, quantitative: <5 m[IU]/mL (ref <5)

## 2017-09-12 NOTE — ED Notes (Signed)
Pt. Stated, Im still having chest pain

## 2017-09-12 NOTE — ED Triage Notes (Addendum)
Arrived via EMS patient developed chest pain left and right breast intermittently 3 episodes. Took 324 mg aspirin herself. When EMS arrived patient denied chest pain and continues to deny chest pain. States history of COPD and has shortness of breath for a long time. States on home oxygen 2L [place] all the time.

## 2017-09-19 ENCOUNTER — Ambulatory Visit: Payer: Self-pay | Admitting: Pulmonary Disease

## 2017-09-20 ENCOUNTER — Other Ambulatory Visit: Payer: Self-pay | Admitting: Adult Health

## 2017-09-20 ENCOUNTER — Other Ambulatory Visit: Payer: Self-pay | Admitting: Internal Medicine

## 2017-09-20 ENCOUNTER — Other Ambulatory Visit: Payer: Self-pay | Admitting: Pharmacist

## 2017-10-03 ENCOUNTER — Encounter: Payer: Self-pay | Admitting: Acute Care

## 2017-10-03 ENCOUNTER — Ambulatory Visit (INDEPENDENT_AMBULATORY_CARE_PROVIDER_SITE_OTHER): Payer: Medicaid Other | Admitting: Acute Care

## 2017-10-03 VITALS — BP 138/64 | HR 80 | Ht 67.5 in | Wt 282.0 lb

## 2017-10-03 DIAGNOSIS — J9601 Acute respiratory failure with hypoxia: Secondary | ICD-10-CM | POA: Diagnosis not present

## 2017-10-03 DIAGNOSIS — F17210 Nicotine dependence, cigarettes, uncomplicated: Secondary | ICD-10-CM

## 2017-10-03 DIAGNOSIS — Z720 Tobacco use: Secondary | ICD-10-CM

## 2017-10-03 DIAGNOSIS — G4733 Obstructive sleep apnea (adult) (pediatric): Secondary | ICD-10-CM | POA: Diagnosis not present

## 2017-10-03 DIAGNOSIS — J449 Chronic obstructive pulmonary disease, unspecified: Secondary | ICD-10-CM | POA: Diagnosis not present

## 2017-10-03 MED ORDER — IPRATROPIUM-ALBUTEROL 0.5-2.5 (3) MG/3ML IN SOLN: RESPIRATORY_TRACT | 1 refills | Status: DC

## 2017-10-03 NOTE — Assessment & Plan Note (Signed)
Stable on current regimen Plan: Continue to use oxygen at 2 L while at rest. Continue Duonebs every 6 hours as needed. Continue Anoro 1 puff once daily. Rinse mouth after use. Saturation goals are 88-92% Follow up with Dr. Halford Chessman  In 2 months  or before as needed  Follow up with your PCP at [place]. Please contact office for sooner follow up if symptoms do not improve or worsen or seek emergency care  .

## 2017-10-03 NOTE — Assessment & Plan Note (Addendum)
Continues to smoke Smells strongly of smoke Plan: I have spent 3 minutes counseling patient on smoking cessation this visit. Reminded not to smoke while on oxygen due to possibility of flash burns, explosion of oxygen tank

## 2017-10-03 NOTE — Assessment & Plan Note (Signed)
States she has been desaturating with exertion on 2L pulsed. She has not tried to increase her oxygen flow: Walked in the o26ffice, needs 3 L with exertion. Plan: Needs 3 L with exertion Sat goal is 88-92% Continue to use oxygen at 2 L while at rest. Follow up with your PCP at [facility]. Please contact office for sooner follow up if symptoms do not improve or worsen or seek emergency care  .

## 2017-10-03 NOTE — Progress Notes (Signed)
I advised patient to increase her oxygen to 3L pulse on her POC. I just finished walking her and figured out she needed 3L pulse. Pt replied "I don't feel like it"  I explained the dangers of her oxygen being too low when walking. TA/CMA

## 2017-10-03 NOTE — Assessment & Plan Note (Signed)
Question if non-compliance is due to non-use or damaged machine after device fell off her table. Plan: We will see who provided you with your CPAP machine and have the come and check it to make sure it is working properly. We will place an order for supplies. We will place an order for a lighter POC. Continue on CPAP at bedtime. You appear to be benefiting from the treatment Goal is to wear for at least 6 hours each night for maximal clinical benefit. Continue to work on weight loss, as the link between excess weight  and sleep apnea is well established.  Do not drive if sleepy. Remember to clean mask, tubing, filter, and reservoir once weekly with soapy water.  Follow up with Dr. Halford Chessman  In 2 months  or before as needed with down Load. Follow up with your PCP at [facility]. Please contact office for sooner follow up if symptoms do not improve or worsen or seek emergency care  .

## 2017-10-03 NOTE — Patient Instructions (Addendum)
It is good to see you today. We will see who provided you with your CPAP machine and have the come and check it to make sure it is working properly. We will place an order for supplies. We will place an order for a lighter POC. Continue to use oxygen at 2 L while at rest. We walk you today to determine how much oxygen you need with exertion.  Continue Duonebs every 6 hours as needed. Continue Anoro 1 puff once daily. Rinse mouth after use. Continue on CPAP at bedtime. You appear to be benefiting from the treatment Goal is to wear for at least 6 hours each night for maximal clinical benefit. Continue to work on weight loss, as the link between excess weight  and sleep apnea is well established.  Do not drive if sleepy. Remember to clean mask, tubing, filter, and reservoir once weekly with soapy water.  Follow up with Dr. Halford Chessman  In 2 months  or before as needed with down Load. Follow up with your PCP at [facility]. Please contact office for sooner follow up if symptoms do not improve or worsen or seek emergency care  .

## 2017-10-03 NOTE — Progress Notes (Addendum)
History of Present Illness Tamara Silva is a 57 y.o. female with COPD on home oxygen ( Current smoker )  DM2, CAD with history ofPCI at [place] in 2006 [facility] 2009 (likely to the LAD), hypertension, hyperlipidemia, H/O chronic LLL PE 2014 and chronic systolic HF (last EF 06% 3/01). She was previously  followed by Tamara Silva.    10/03/2017 Follow up for CPAP supplies. Pt. Present for follow up.  Pt. States that her CPAP machine fell off her table and broke. She has worked with it  and now it is better, but still not working 100%.. She needs new supplies, filters. She asked about a new machine, but this machine is only 58 years old. She smells strongly of cigarettes today. She told Tamara Silva my nurse she is smoking again. She has complaints of dyspnea with exertion. She states she does not increase her oxygen with exertion. She was not aware she could do that. We will walk her in the office today to  see which flow she needs to maintain sats with exertion. She states she is using her Duonebs and Anoro as directed. She denies any fever, chest pain, orthopnea or hemoptysis. She states she is compliant with her CPAP machine even though down Load reveals no use in the last 2 months. She states this is because the machine broke when it fell, and the Sim card is no longer working. She is asking about a walker and several other issues she would like to have taken care of. I have referred her to her PCP for all non-pulmonary issues.    Test Results: CPAP Down Load: 07/08/2017-08/06/2017 Air Sense 10 Auto Set Usage 21/30 days > 4 hours (67%) < 4 hours ( 3%) Auto set 5-15 cm pressure Median pressure 6.9 Max pressure 13.7 Minimal leaks AHI=0.5  CBC Latest Ref Rng & Units 09/12/2017 07/25/2017 06/27/2015  WBC 4.0 - 10.5 K/uL 6.5 6.3 7.4  Hemoglobin 12.0 - 15.0 g/dL 9.0(L) 9.2(L) 8.8(L)  Hematocrit 36.0 - 46.0 % 34.7(L) 33.4(L) 32.2(L)  Platelets 150 - 400 K/uL 274 355 286    BMP Latest Ref Rng &  Units 09/12/2017 07/25/2017 07/08/2015  Glucose 65 - 99 mg/dL 119(H) 271(H) 90  BUN 6 - 20 mg/dL 8 13 13   Creatinine 0.44 - 1.00 mg/dL 0.87 0.89 0.89  Sodium 135 - 145 mmol/L 138 129(L) 140  Potassium 3.5 - 5.1 mmol/L 4.5 4.2 4.2  Chloride 101 - 111 mmol/L 95(L) 90(L) 100  CO2 22 - 32 mmol/L 35(H) 29 32(H)  Calcium 8.9 - 10.3 mg/dL 8.9 8.6(L) 9.1    BNP    Component Value Date/Time   BNP 814.0 (H) 07/25/2017 1538    ProBNP    Component Value Date/Time   PROBNP 164.9 (H) 03/15/2014 0917    PFT No results found for: FEV1PRE, FEV1POST, FVCPRE, FVCPOST, TLC, DLCOUNC, PREFEV1FVCRT, PSTFEV1FVCRT  Dg Chest 2 View  Result Date: 09/12/2017 CLINICAL DATA:  BILATERAL chest pain, and shortness of breath. EXAM: CHEST - 2 VIEW COMPARISON:  07/25/2017. FINDINGS: Cardiomegaly. BILATERAL pulmonary opacities representing either pneumonia or pulmonary edema. Small effusions. No pneumothorax. Scarring in the LEFT hemithorax appears slightly improved. This may relate to less fluid. Otherwise similar appearance to priors. IMPRESSION: Cardiomegaly with BILATERAL pulmonary opacities, representing either pneumonia or pulmonary edema. Electronically Signed   By: Tamara Silva M.D.   On: 09/12/2017 16:40     Past medical hx Past Medical History:  Diagnosis Date  . Asthma   .  Diabetes mellitus without complication ([place])      Social History   Tobacco Use  . Smoking status: Former Smoker    Packs/day: 0.50    Years: 20.00    Pack years: 10.00    Types: Cigarettes    Last attempt to quit: 04/30/2014    Years since quitting: 3.4  . Smokeless tobacco: Never Used  . Tobacco comment: smoked off and on x 20 years  Substance Use Topics  . Alcohol use: No    Alcohol/week: 0.0 oz  . Drug use: No    [name] reports that she quit smoking about 3 years ago. Her smoking use included cigarettes. She has a 10.00 pack-year smoking history. She has never used smokeless tobacco. She reports that she does not  drink alcohol or use drugs.  Tobacco Cessation: Current every day smoker Smells of Cigarettes I have spent 3 minutes counseling patient on smoking cessation this visit.  Past surgical hx, Family hx, Social hx all reviewed.  Current Outpatient Medications on File Prior to Visit  Medication Sig  . ACCU-CHEK AVIVA PLUS test strip USE 1 STRIP TO CHECK GLUCOSE 3 TIMES DAILY  . ACCU-CHEK FASTCLIX LANCETS MISC 1 applicator by Does not apply route 4 (four) times daily -  before meals and at bedtime.  [name] Kitchen ACCU-CHEK SOFTCLIX LANCETS lancets USE 1 LANCET TO CHECK GLUCOSE 3 TIMES DAILY  . ANORO ELLIPTA 62.5-25 MCG/INH AEPB INHALE 1 PUFF BY MOUTH ONCE DAILY **MAKE AN APPOINTMENT WITH MD FOR MORE REFILLS**  . aspirin 81 MG tablet Take 1 tablet (81 mg total) by mouth daily.  [name] Kitchen azithromycin (ZITHROMAX) 250 MG tablet Take 1 tablet (250 mg total) by mouth daily. Take first 2 tablets together, then 1 every day until finished.  . Blood Glucose Monitoring Suppl (ACCU-CHEK AVIVA) device Use as instructed 3 times daily before meals.  . carvedilol (COREG) 12.5 MG tablet TAKE 1 TABLET BY MOUTH 2 TIMES A DAY WITH A MEAL.  [name] Kitchen EASY TOUCH PEN NEEDLES 31G X 8 MM MISC USE AS DIRECTED TO INJECT INSULIN  . fluticasone (FLONASE) 50 MCG/ACT nasal spray INSTILL 2 SPRAYS IN EACH NOSTRIL TWICE DAILY.  . furosemide (LASIX) 40 MG tablet Take 40 mg by mouth daily as needed for fluid.   . INSULIN SYRINGE .5CC/29G 29G X 1/2" 0.5 ML MISC Use as instructed by physician  . LEVEMIR 100 UNIT/ML injection INJECT 45 UNITS SUBCUTANEOUSLY EVERY NIGHT AT BEDTIME *MUST HAVE OFFICE VISIT FOR REFILLS*  . losartan (COZAAR) 50 MG tablet Take 1 tablet (50 mg total) by mouth daily.  . nitroGLYCERIN (NITROSTAT) 0.4 MG SL tablet Place 1 tablet (0.4 mg total) under the tongue every 5 (five) minutes as needed for chest pain.  [name] Kitchen NOVOLOG FLEXPEN 100 UNIT/ML FlexPen INJECT 15-20 UNITS SUBCUTANEOUSLY 3 TIMES A DAY WITH MEALS.  [name] Kitchen omeprazole (PRILOSEC) 40 MG  capsule TAKE 1 CAPSULE BY MOUTH TWICE DAILY.  . prasugrel (EFFIENT) 10 MG TABS tablet Take 1 tablet (10 mg total) by mouth daily.  . prazosin (MINIPRESS) 5 MG capsule TAKE 1 CAPSULE BY MOUTH EVERY NIGHT AT BEDTIME FOR NIGHTMARES.  [name] Kitchen PROAIR HFA 108 (90 Base) MCG/ACT inhaler INHALE 2 PUFFS BY MOUTH 4 TIMES DAILY AS NEEDED (Patient taking differently: INHALE 2 PUFFS BY MOUTH 4 TIMES DAILY AS NEEDED SOB)  . promethazine (PHENERGAN) 25 MG tablet Take 1 tablet (25 mg total) by mouth every 6 (six) hours as needed for nausea or vomiting.  . rosuvastatin (CRESTOR) 10 MG tablet TAKE ONE  TABLET BY MOUTH DAILY.  [name] Kitchen spironolactone (ALDACTONE) 25 MG tablet Take 1 tablet (25 mg total) by mouth daily.   No current facility-administered medications on file prior to visit.      Allergies  Allergen Reactions  . Metolazone Other (See Comments)    Dizziness and falling  . Plavix [Clopidogrel Bisulfate] Other (See Comments)    High PRU's - non-responder  . Nsaids Other (See Comments)    "I do not take NSAIDs d/t interfering with other meds"    Review Of Systems:  Constitutional:   No  weight loss, night sweats,  Fevers, chills, fatigue, or  lassitude.  HEENT:   No headaches,  Difficulty swallowing,  Tooth/dental problems, or  Sore throat,                No sneezing, itching, ear ache, nasal congestion, post nasal drip,   CV:  No chest pain,  Orthopnea, PND, swelling in lower extremities, anasarca, dizziness, palpitations, syncope.   GI  No heartburn, indigestion, abdominal pain, nausea, vomiting, diarrhea, change in bowel habits, loss of appetite, bloody stools.   Resp: + shortness of breath with exertion or at rest.  No excess mucus, no productive cough,  No non-productive cough,  No coughing up of blood.  No change in color of mucus.  Occasional  wheezing.  No chest wall deformity  Skin: no rash or lesions.  GU: no dysuria, change in color of urine, no urgency or frequency.  No flank pain, no  hematuria   MS:  No joint pain or swelling.  No decreased range of motion.  No back pain.  Psych:  No change in mood or affect. No depression or anxiety.  No memory loss.   Vital Signs BP 138/64 (BP Location: Left Arm, Cuff Size: Normal)   Pulse 80   Ht 5' 7.5" (1.715 m)   Wt 282 lb (127.9 kg)   LMP 07/28/2013 Comment: perimenopausal  SpO2 (!) 86%   BMI 43.52 kg/m    Physical Exam:  General- No distress,  A&Ox3, pleasant ENT: No sinus tenderness, TM clear, pale nasal mucosa, no oral exudate,no post nasal drip, no LAN Cardiac: S1, S2, regular rate and rhythm, no murmur Chest: No wheeze/ rales/ dullness; no accessory muscle use, no nasal flaring, no sternal retractions Abd.: Soft Non-tender, Obese, BS +, ND Ext: No clubbing cyanosis, trace BLE edema Neuro:  normal strength, MAE x 4. A&O x 3 Skin: No rashes, warm and dry Psych: normal mood and behavior   Assessment/Plan  Acute respiratory failure with hypoxia (HCC) States she has been desaturating with exertion on 2L pulsed. She has not tried to increase her oxygen flow: Walked in the o37ffice, needs 3 L with exertion. Plan: Needs 3 L with exertion Sat goal is 88-92% Continue to use oxygen at 2 L while at rest. Follow up with your PCP at [facility]. Please contact office for sooner follow up if symptoms do not improve or worsen or seek emergency care  .   COPD GOLD III  Stable on current regimen Plan: Continue to use oxygen at 2 L while at rest. Continue Duonebs every 6 hours as needed. Continue Anoro 1 puff once daily. Rinse mouth after use. Saturation goals are 88-92% Follow up with Dr. Halford Chessman  In 2 months  or before as needed  Follow up with your PCP at [place]. Please contact office for sooner follow up if symptoms do not improve or worsen or  seek emergency care  .   OSA (obstructive sleep apnea) Question if non-compliance is due to non-use or damaged machine after device fell  off her table. Plan: We will see who provided you with your CPAP machine and have the come and check it to make sure it is working properly. We will place an order for supplies. We will place an order for a lighter POC. Continue on CPAP at bedtime. You appear to be benefiting from the treatment Goal is to wear for at least 6 hours each night for maximal clinical benefit. Continue to work on weight loss, as the link between excess weight  and sleep apnea is well established.  Do not drive if sleepy. Remember to clean mask, tubing, filter, and reservoir once weekly with soapy water.  Follow up with Dr. Halford Chessman  In 2 months  or before as needed with down Load. Follow up with your PCP at [facility]. Please contact office for sooner follow up if symptoms do not improve or worsen or seek emergency care  .     Tobacco abuse disorder Continues to smoke Smells strongly of smoke Plan: I have spent 3 minutes counseling patient on smoking cessation this visit. Reminded not to smoke while on oxygen due to possibility of flash burns, explosion of oxygen tank    Magdalen Spatz, NP 10/03/2017  2:01 PM

## 2017-10-13 ENCOUNTER — Telehealth: Payer: Self-pay | Admitting: Acute Care

## 2017-10-13 NOTE — Telephone Encounter (Signed)
Tamara Silva, Will you please call the patient and let her know she needs to take the CPAP machine to the DME to see if it can be repaired. Thanks so much.

## 2017-10-13 NOTE — Telephone Encounter (Signed)
Called and spoke to [place] at [facility]. Tiffany stated that SG had wanted [employer] to check patient's CPAP machine due to the machine falling off the table and breaking. Tiffany stated that the warranty for the machine had expired so they couldn't replace the machine but that they have asked the patient to bring the machine to the office so they could check the machine and see if it needs repairs. Tiffany stated that she tried to relay the patient this information but that patient got angry and hung up on her but another [employer] member has spoken to patient. Patient will need to bring the machine to the office and if repairs are needed it can be shipped to be repaired and the patient will need to rent a machine during the time it's repaired and will need to pay for the rental 2 months upfront. Patient's machine is only 58 years old which is why a new machine can't be provided at this time.  Tiffany stated that patient is still on their list and will be contacted 2 more times before they stop attempted to contact patient.  She just wanted to pass this information to SG. Nothing is needed at this time.

## 2017-10-14 NOTE — Telephone Encounter (Addendum)
I called pt to let her know and she stated she already brought it to them and they stated it would be 400 dollars to fix and she cannot afford to rent one. Is there anything else we can do? She has Medicaid. She also states [employer] didn't provide her with that machine and she doesn't know where she got it from. I looked back in referrals and they have been the only DME she has according to her chart.

## 2017-10-18 ENCOUNTER — Other Ambulatory Visit: Payer: Self-pay | Admitting: Acute Care

## 2017-10-18 ENCOUNTER — Other Ambulatory Visit: Payer: Self-pay | Admitting: Pharmacist

## 2017-10-18 ENCOUNTER — Other Ambulatory Visit (HOSPITAL_COMMUNITY): Payer: Self-pay | Admitting: Cardiology

## 2017-10-20 ENCOUNTER — Telehealth: Payer: Self-pay | Admitting: Acute Care

## 2017-10-20 NOTE — Telephone Encounter (Signed)
PA requested on www.[email or web address] today Key: [id number]  This will take 7-10 days to make decision

## 2017-10-28 ENCOUNTER — Telehealth: Payer: Self-pay | Admitting: Pulmonary Disease

## 2017-10-28 NOTE — Telephone Encounter (Signed)
Received a fax from the pt's pharmacy that [facility] was needing a PA. It was documented on 10/20/17 that the PA was started on Cover My Meds. I checked Cover My Meds and this PA has not been processed. I called [employer] at [phone]. Anoro has been approved through 10/28/17. Nothing further was needed.

## 2017-11-15 ENCOUNTER — Other Ambulatory Visit: Payer: Self-pay | Admitting: Pulmonary Disease

## 2017-11-15 ENCOUNTER — Other Ambulatory Visit: Payer: Self-pay | Admitting: Pharmacist

## 2017-11-15 ENCOUNTER — Other Ambulatory Visit (HOSPITAL_COMMUNITY): Payer: Self-pay | Admitting: Cardiology

## 2017-12-14 ENCOUNTER — Ambulatory Visit: Payer: Self-pay | Admitting: Pulmonary Disease

## 2018-01-04 ENCOUNTER — Other Ambulatory Visit: Payer: Self-pay | Admitting: Pulmonary Disease

## 2018-01-04 ENCOUNTER — Other Ambulatory Visit: Payer: Self-pay | Admitting: Adult Health

## 2018-01-04 ENCOUNTER — Other Ambulatory Visit: Payer: Self-pay | Admitting: Pharmacist

## 2018-01-04 ENCOUNTER — Other Ambulatory Visit (HOSPITAL_COMMUNITY): Payer: Self-pay | Admitting: Cardiology

## 2018-01-13 ENCOUNTER — Emergency Department (HOSPITAL_COMMUNITY): Payer: Medicaid Other

## 2018-01-13 ENCOUNTER — Ambulatory Visit: Payer: Self-pay | Admitting: Pulmonary Disease

## 2018-01-13 ENCOUNTER — Inpatient Hospital Stay (HOSPITAL_COMMUNITY)
Admission: EM | Admit: 2018-01-13 | Discharge: 2018-01-18 | DRG: 292 | Disposition: A | Discharge status: Home Health | Payer: Medicaid Other | Attending: Family Medicine | Admitting: Family Medicine

## 2018-01-13 ENCOUNTER — Encounter (HOSPITAL_COMMUNITY): Payer: Self-pay | Admitting: Emergency Medicine

## 2018-01-13 ENCOUNTER — Other Ambulatory Visit: Payer: Self-pay

## 2018-01-13 DIAGNOSIS — Z7982 Long term (current) use of aspirin: Secondary | ICD-10-CM | POA: Diagnosis not present

## 2018-01-13 DIAGNOSIS — Z888 Allergy status to other drugs, medicaments and biological substances status: Secondary | ICD-10-CM | POA: Diagnosis not present

## 2018-01-13 DIAGNOSIS — Z87891 Personal history of nicotine dependence: Secondary | ICD-10-CM | POA: Diagnosis not present

## 2018-01-13 DIAGNOSIS — E875 Hyperkalemia: Secondary | ICD-10-CM | POA: Diagnosis present

## 2018-01-13 DIAGNOSIS — I110 Hypertensive heart disease with heart failure: Principal | ICD-10-CM | POA: Diagnosis present

## 2018-01-13 DIAGNOSIS — I5043 Acute on chronic combined systolic (congestive) and diastolic (congestive) heart failure: Secondary | ICD-10-CM | POA: Diagnosis present

## 2018-01-13 DIAGNOSIS — E1165 Type 2 diabetes mellitus with hyperglycemia: Secondary | ICD-10-CM | POA: Diagnosis present

## 2018-01-13 DIAGNOSIS — E785 Hyperlipidemia, unspecified: Secondary | ICD-10-CM | POA: Diagnosis present

## 2018-01-13 DIAGNOSIS — G4733 Obstructive sleep apnea (adult) (pediatric): Secondary | ICD-10-CM | POA: Diagnosis present

## 2018-01-13 DIAGNOSIS — K5909 Other constipation: Secondary | ICD-10-CM | POA: Diagnosis present

## 2018-01-13 DIAGNOSIS — Z955 Presence of coronary angioplasty implant and graft: Secondary | ICD-10-CM

## 2018-01-13 DIAGNOSIS — J45909 Unspecified asthma, uncomplicated: Secondary | ICD-10-CM | POA: Diagnosis present

## 2018-01-13 DIAGNOSIS — I10 Essential (primary) hypertension: Secondary | ICD-10-CM | POA: Diagnosis not present

## 2018-01-13 DIAGNOSIS — I2510 Atherosclerotic heart disease of native coronary artery without angina pectoris: Secondary | ICD-10-CM | POA: Diagnosis present

## 2018-01-13 DIAGNOSIS — D509 Iron deficiency anemia, unspecified: Secondary | ICD-10-CM | POA: Diagnosis present

## 2018-01-13 DIAGNOSIS — T501X5A Adverse effect of loop [high-ceiling] diuretics, initial encounter: Secondary | ICD-10-CM | POA: Diagnosis present

## 2018-01-13 DIAGNOSIS — Z9981 Dependence on supplemental oxygen: Secondary | ICD-10-CM | POA: Diagnosis not present

## 2018-01-13 DIAGNOSIS — Z6841 Body Mass Index (BMI) 40.0 and over, adult: Secondary | ICD-10-CM | POA: Diagnosis not present

## 2018-01-13 DIAGNOSIS — N179 Acute kidney failure, unspecified: Secondary | ICD-10-CM | POA: Diagnosis present

## 2018-01-13 DIAGNOSIS — J9611 Chronic respiratory failure with hypoxia: Secondary | ICD-10-CM | POA: Diagnosis present

## 2018-01-13 DIAGNOSIS — J9621 Acute and chronic respiratory failure with hypoxia: Secondary | ICD-10-CM | POA: Diagnosis not present

## 2018-01-13 DIAGNOSIS — I361 Nonrheumatic tricuspid (valve) insufficiency: Secondary | ICD-10-CM | POA: Diagnosis not present

## 2018-01-13 DIAGNOSIS — Z794 Long term (current) use of insulin: Secondary | ICD-10-CM | POA: Diagnosis not present

## 2018-01-13 DIAGNOSIS — J9622 Acute and chronic respiratory failure with hypercapnia: Secondary | ICD-10-CM | POA: Diagnosis not present

## 2018-01-13 DIAGNOSIS — Z79899 Other long term (current) drug therapy: Secondary | ICD-10-CM | POA: Diagnosis not present

## 2018-01-13 DIAGNOSIS — Z9114 Patient's other noncompliance with medication regimen: Secondary | ICD-10-CM | POA: Diagnosis not present

## 2018-01-13 LAB — URINALYSIS, ROUTINE W REFLEX MICROSCOPIC
Bilirubin Urine: NEGATIVE
Glucose, UA: >=500 mg/dL — AB
Hgb urine dipstick: NEGATIVE
Ketones, ur: NEGATIVE mg/dL
Leukocytes, UA: NEGATIVE
Nitrite: NEGATIVE
Protein, ur: 100 mg/dL — AB
Specific Gravity, Urine: 1.021 (ref 1.005–1.030)
pH: 5 (ref 5.0–8.0)

## 2018-01-13 LAB — CBC WITH DIFFERENTIAL/PLATELET
Basophils Absolute: 0.1 10*3/uL (ref 0.0–0.1)
Basophils Relative: 1 %
Eosinophils Absolute: 0.3 10*3/uL (ref 0.0–0.7)
Eosinophils Relative: 4 %
HCT: 31.4 % — ABNORMAL LOW (ref 36.0–46.0)
Hemoglobin: 7.9 g/dL — ABNORMAL LOW (ref 12.0–15.0)
Lymphocytes Relative: 26 %
Lymphs Abs: 1.7 10*3/uL (ref 0.7–4.0)
MCH: 17.5 pg — ABNORMAL LOW (ref 26.0–34.0)
MCHC: 25.2 g/dL — ABNORMAL LOW (ref 30.0–36.0)
MCV: 69.5 fL — ABNORMAL LOW (ref 78.0–100.0)
Monocytes Absolute: 0.5 10*3/uL (ref 0.1–1.0)
Monocytes Relative: 8 %
Neutro Abs: 3.8 10*3/uL (ref 1.7–7.7)
Neutrophils Relative %: 61 %
Platelets: 206 10*3/uL (ref 150–400)
RBC: 4.52 MIL/uL (ref 3.87–5.11)
RDW: 24.5 % — ABNORMAL HIGH (ref 11.5–15.5)
WBC: 6.4 10*3/uL (ref 4.0–10.5)

## 2018-01-13 LAB — BASIC METABOLIC PANEL
Anion gap: 6 (ref 5–15)
BUN: 29 mg/dL — ABNORMAL HIGH (ref 6–20)
CO2: 28 mmol/L (ref 22–32)
Calcium: 8.4 mg/dL — ABNORMAL LOW (ref 8.9–10.3)
Chloride: 103 mmol/L (ref 98–111)
Creatinine, Ser: 1.62 mg/dL — ABNORMAL HIGH (ref 0.44–1.00)
GFR calc Af Amer: 39 mL/min — ABNORMAL LOW (ref >60)
GFR calc non Af Amer: 34 mL/min — ABNORMAL LOW (ref >60)
Glucose, Bld: 342 mg/dL — ABNORMAL HIGH (ref 70–99)
Potassium: 5.6 mmol/L — ABNORMAL HIGH (ref 3.5–5.1)
Sodium: 137 mmol/L (ref 135–145)

## 2018-01-13 LAB — GLUCOSE, CAPILLARY: Glucose-Capillary: 269 mg/dL — ABNORMAL HIGH (ref 70–99)

## 2018-01-13 LAB — BRAIN NATRIURETIC PEPTIDE: B Natriuretic Peptide: 795.9 pg/mL — ABNORMAL HIGH (ref 0.0–100.0)

## 2018-01-13 MED ORDER — ASPIRIN EC 81 MG PO TBEC
81.0000 mg | DELAYED_RELEASE_TABLET | Freq: Every day | ORAL | Status: DC
  Administered 2018-01-14 – 2018-01-18 (×5): 81 mg via ORAL
  Filled 2018-01-13 (×5): qty 1

## 2018-01-13 MED ORDER — ALBUTEROL SULFATE (2.5 MG/3ML) 0.083% IN NEBU
5.0000 mg | INHALATION_SOLUTION | Freq: Once | RESPIRATORY_TRACT | Status: AC
  Administered 2018-01-13: 5 mg via RESPIRATORY_TRACT
  Filled 2018-01-13: qty 6

## 2018-01-13 MED ORDER — INSULIN ASPART 100 UNIT/ML ~~LOC~~ SOLN
6.0000 [IU] | Freq: Once | SUBCUTANEOUS | Status: AC
  Administered 2018-01-13: 6 [IU] via INTRAVENOUS
  Filled 2018-01-13: qty 1

## 2018-01-13 MED ORDER — ACETAMINOPHEN 325 MG PO TABS
650.0000 mg | ORAL_TABLET | Freq: Four times a day (QID) | ORAL | Status: DC | PRN
  Administered 2018-01-15 – 2018-01-17 (×2): 650 mg via ORAL
  Filled 2018-01-13 (×2): qty 2

## 2018-01-13 MED ORDER — UMECLIDINIUM-VILANTEROL 62.5-25 MCG/INH IN AEPB
1.0000 | INHALATION_SPRAY | Freq: Every day | RESPIRATORY_TRACT | Status: DC
  Administered 2018-01-14 – 2018-01-18 (×5): 1 via RESPIRATORY_TRACT
  Filled 2018-01-13: qty 14

## 2018-01-13 MED ORDER — FUROSEMIDE 10 MG/ML IJ SOLN
80.0000 mg | Freq: Two times a day (BID) | INTRAMUSCULAR | Status: DC
  Administered 2018-01-13 – 2018-01-18 (×10): 80 mg via INTRAVENOUS
  Filled 2018-01-13 (×10): qty 8

## 2018-01-13 MED ORDER — ONDANSETRON HCL 4 MG PO TABS: 4.0000 mg | ORAL_TABLET | Freq: Four times a day (QID) | ORAL | Status: DC | PRN

## 2018-01-13 MED ORDER — PRASUGREL HCL 10 MG PO TABS
10.0000 mg | ORAL_TABLET | Freq: Every day | ORAL | Status: DC
  Administered 2018-01-14 – 2018-01-18 (×5): 10 mg via ORAL
  Filled 2018-01-13 (×5): qty 1

## 2018-01-13 MED ORDER — SODIUM CHLORIDE 0.9 % IV BOLUS
500.0000 mL | Freq: Once | INTRAVENOUS | Status: DC
  Administered 2018-01-13: 500 mL via INTRAVENOUS

## 2018-01-13 MED ORDER — HEPARIN SODIUM (PORCINE) 5000 UNIT/ML IJ SOLN
5000.0000 [IU] | Freq: Three times a day (TID) | INTRAMUSCULAR | Status: DC
  Administered 2018-01-13 – 2018-01-18 (×11): 5000 [IU] via SUBCUTANEOUS
  Filled 2018-01-13 (×13): qty 1

## 2018-01-13 MED ORDER — INSULIN GLARGINE 100 UNIT/ML ~~LOC~~ SOLN
45.0000 [IU] | Freq: Every day | SUBCUTANEOUS | Status: DC
  Administered 2018-01-13 – 2018-01-17 (×5): 45 [IU] via SUBCUTANEOUS
  Filled 2018-01-13 (×6): qty 0.45

## 2018-01-13 MED ORDER — ONDANSETRON HCL 4 MG/2ML IJ SOLN: 4.0000 mg | Freq: Four times a day (QID) | INTRAMUSCULAR | Status: DC | PRN

## 2018-01-13 MED ORDER — BISACODYL 10 MG RE SUPP
10.0000 mg | Freq: Every day | RECTAL | Status: DC | PRN
  Filled 2018-01-13: qty 1

## 2018-01-13 MED ORDER — SODIUM CHLORIDE 0.9 % IV SOLN
250.0000 mL | INTRAVENOUS | Status: DC | PRN
  Administered 2018-01-14: 250 mL via INTRAVENOUS

## 2018-01-13 MED ORDER — ROSUVASTATIN CALCIUM 10 MG PO TABS
10.0000 mg | ORAL_TABLET | Freq: Every day | ORAL | Status: DC
  Administered 2018-01-14 – 2018-01-18 (×5): 10 mg via ORAL
  Filled 2018-01-13 (×4): qty 1

## 2018-01-13 MED ORDER — INSULIN ASPART 100 UNIT/ML ~~LOC~~ SOLN
0.0000 [IU] | Freq: Every day | SUBCUTANEOUS | Status: DC
  Administered 2018-01-13 – 2018-01-14 (×2): 3 [IU] via SUBCUTANEOUS
  Administered 2018-01-15: 2 [IU] via SUBCUTANEOUS

## 2018-01-13 MED ORDER — SODIUM CHLORIDE 0.9% FLUSH: 3.0000 mL | INTRAVENOUS | Status: DC | PRN

## 2018-01-13 MED ORDER — SODIUM CHLORIDE 0.9% FLUSH
3.0000 mL | Freq: Two times a day (BID) | INTRAVENOUS | Status: DC
  Administered 2018-01-13 – 2018-01-18 (×9): 3 mL via INTRAVENOUS

## 2018-01-13 MED ORDER — ACETAMINOPHEN 650 MG RE SUPP: 650.0000 mg | Freq: Four times a day (QID) | RECTAL | Status: DC | PRN

## 2018-01-13 MED ORDER — CARVEDILOL 12.5 MG PO TABS
12.5000 mg | ORAL_TABLET | Freq: Two times a day (BID) | ORAL | Status: DC
  Administered 2018-01-14 – 2018-01-18 (×9): 12.5 mg via ORAL
  Filled 2018-01-13 (×9): qty 1

## 2018-01-13 MED ORDER — PANTOPRAZOLE SODIUM 40 MG PO TBEC
80.0000 mg | DELAYED_RELEASE_TABLET | Freq: Every day | ORAL | Status: DC
  Administered 2018-01-14 – 2018-01-18 (×5): 80 mg via ORAL
  Filled 2018-01-13 (×4): qty 2

## 2018-01-13 MED ORDER — FLUTICASONE PROPIONATE 50 MCG/ACT NA SUSP
2.0000 | Freq: Every day | NASAL | Status: DC
  Administered 2018-01-14 – 2018-01-18 (×5): 2 via NASAL
  Filled 2018-01-13: qty 16

## 2018-01-13 MED ORDER — SENNA 8.6 MG PO TABS
1.0000 | ORAL_TABLET | Freq: Two times a day (BID) | ORAL | Status: DC
Start: 2018-01-13 — End: 2018-01-18
  Administered 2018-01-13 – 2018-01-18 (×8): 8.6 mg via ORAL
  Filled 2018-01-13 (×9): qty 1

## 2018-01-13 MED ORDER — POLYETHYLENE GLYCOL 3350 17 G PO PACK
17.0000 g | PACK | Freq: Every day | ORAL | Status: DC
  Administered 2018-01-13 – 2018-01-18 (×6): 17 g via ORAL
  Filled 2018-01-13 (×5): qty 1

## 2018-01-13 MED ORDER — INSULIN ASPART 100 UNIT/ML ~~LOC~~ SOLN
0.0000 [IU] | Freq: Three times a day (TID) | SUBCUTANEOUS | Status: DC
  Administered 2018-01-14: 3 [IU] via SUBCUTANEOUS
  Administered 2018-01-14 (×2): 8 [IU] via SUBCUTANEOUS
  Administered 2018-01-15 – 2018-01-17 (×6): 2 [IU] via SUBCUTANEOUS
  Administered 2018-01-17: 3 [IU] via SUBCUTANEOUS
  Administered 2018-01-18 (×2): 2 [IU] via SUBCUTANEOUS

## 2018-01-13 MED ORDER — SODIUM CHLORIDE 0.9% FLUSH
3.0000 mL | Freq: Two times a day (BID) | INTRAVENOUS | Status: DC
Start: 2018-01-13 — End: 2018-01-18
  Administered 2018-01-13 – 2018-01-17 (×5): 3 mL via INTRAVENOUS

## 2018-01-13 MED ORDER — IPRATROPIUM-ALBUTEROL 0.5-2.5 (3) MG/3ML IN SOLN: 3.0000 mL | Freq: Four times a day (QID) | RESPIRATORY_TRACT | Status: DC | PRN

## 2018-01-13 NOTE — Progress Notes (Signed)
Pt. Denies knowledge of CPAP settings from home.  Placed on auto titrate with water added to chamber per patients request.  Oxygen bleed thru set up per patient request.

## 2018-01-13 NOTE — ED Provider Notes (Signed)
[facility] DEPT Provider Note   CSN: [id number] Arrival date & time: 01/13/18  1126     History   Chief Complaint Chief Complaint  Patient presents with  . Patient O2 battery is dead    HPI Tamara Silva is a 57 y.o. female.  HPI Patient presents to the emergency department with issues with her portable oxygen.  The patient was going to appointment with her primary doctor when she states that her oxygen tanks were low and then she also had no charge on the regulator to supply the oxygen.  Patient states that EMS is unable to provide her with a new oxygen tank and then wanted to be transported to the emergency department.  Patient states that nothing seems make the condition better or worse.  She states that she is having constipation which is chronic for her.  Patient states she wants to make sure that her laboratory testing is okay due to the fact she was going to the doctor for testing.  The patient denies chest pain, shortness of breath, headache,blurred vision, neck pain, fever, cough, weakness, numbness, dizziness, anorexia,  abdominal pain, nausea, vomiting, diarrhea, rash, back pain, dysuria, hematemesis, bloody stool, near syncope, or syncope.  Patient is complaining of swelling to both feet which is chronic for the patient as well. Past Medical History:  Diagnosis Date  . Asthma   . Diabetes mellitus without complication [facility])     Patient Active Problem List   Diagnosis Date Noted  . Tobacco abuse disorder 10/03/2017  . Dyslipidemia 04/29/2016  . Primary insomnia 04/29/2016  . Hypoxemia 09/08/2015  . CHF (congestive heart failure) ([place]) 09/08/2015  . Obesity hypoventilation syndrome ([place])   . Microcytic anemia   . Acute encephalopathy   . Uncontrolled type 2 diabetes mellitus with hyperosmolarity without coma ([place])   . Acute on chronic respiratory failure with hypercapnia ([place])   . CAP (community acquired pneumonia)   . OSA (obstructive  sleep apnea)   . Acute on chronic respiratory failure ([place]) 06/22/2015  . Acute on chronic combined systolic (congestive) and diastolic (congestive) heart failure ([place]) 06/21/2015  . Hypoglycemia 06/21/2015  . Uncontrolled type 2 diabetes mellitus without complication, with long-term current use of insulin ([place])   . Dyslipidemia associated with type 2 diabetes mellitus ([place]) 05/21/2015  . Anemia of chronic kidney failure 05/21/2015  . Hyperkalemia 05/21/2015  . COPD GOLD III  06/10/2014  . Acute respiratory failure with hypoxia ([place]) 03/04/2014  . CKD (chronic kidney disease), stage II 02/07/2014  . COPD exacerbation ([place]) 02/01/2014  . CAD S/P percutaneous coronary angioplasty - prior PCI to LAD; RCA PCI: new Xience Alpine DES 2.75 mm x 15 mm  10/07/2013  . Morbid obesity due to excess calories ([place]) 07/30/2013  . Chronic combined systolic and diastolic congestive heart failure, NYHA class 2; EF 30-35% 07/10/2013  . Chronic pulmonary embolism ([place]) 02/08/2013  . Bipolar disorder ([place]) 02/08/2013  . Essential hypertension 02/08/2013  . Chronic anticoagulation 11/27/2012    Past Surgical History:  Procedure Laterality Date  . COLONOSCOPY WITH PROPOFOL N/A 08/03/2013   Procedure: COLONOSCOPY WITH PROPOFOL;  Surgeon: Jeryl Columbia, MD;  Location: [facility] ENDOSCOPY;  Service: Endoscopy;  Laterality: N/A;  . CORONARY ANGIOPLASTY WITH STENT PLACEMENT     CAD in 2006 x 2 and 2009 2 more- place din REx in [place] and [place] med  . CORONARY ANGIOPLASTY WITH STENT PLACEMENT  10/07/2013   Xience Alpine DES 2.75  mm x 15  mm  . ESOPHAGOGASTRODUODENOSCOPY (EGD) WITH PROPOFOL N/A 08/03/2013   Procedure: ESOPHAGOGASTRODUODENOSCOPY (EGD) WITH PROPOFOL;  Surgeon: Jeryl Columbia, MD;  Location: [facility] ENDOSCOPY;  Service: Endoscopy;  Laterality: N/A;  . LEFT HEART CATHETERIZATION WITH CORONARY ANGIOGRAM N/A 10/07/2013   Procedure: LEFT HEART CATHETERIZATION WITH CORONARY ANGIOGRAM;  Surgeon: Leonie Man, MD;   Location[facility] C[facility] LAB;  Service: Cardiovascular;  Laterality: N/A;     OB History   None      Home Medications    Prior to Admission medications   Medication Sig Start Date End Date Taking? Authorizing Provider  ANORO ELLIPTA 62.5-25 MCG/INH AEPB INHALE 1 PUFF BY MOUTH ONCE DAILY **MAKE AN APPOINTMENT WITH MD FOR MORE REFILLS** 01/06/18  Yes Byrum, Rose Fillers, MD  aspirin 81 MG tablet Take 1 tablet (81 mg total) by mouth daily. 10/09/13  Yes Barrett, Evelene Croon, PA-C  carvedilol (COREG) 12.5 MG tablet TAKE 1 TABLET BY MOUTH 2 TIMES A DAY WITH A MEAL 01/05/18  Yes Larey Dresser, MD  fluticasone (FLONASE) 50 MCG/ACT nasal spray INSTILL 2 SPRAYS IN EACH NOSTRIL TWICE DAILY. 04/08/17  Yes Tresa Garter, MD  furosemide (LASIX) 40 MG tablet Take 40 mg by mouth daily as needed for fluid.    Yes [provider]  ipratropium-albuterol (DUONEB) 0.5-2.5 (3) MG/3ML SOLN INHALE THE CONTENTS OF 1 VIAL VIA NEBULIZER EVERY 6 HOURS AS NEEDED 01/05/18  Yes Rigoberto Noel, MD  LEVEMIR 100 UNIT/ML injection INJECT 45 UNITS SUBCUTANEOUSLY EVERY NIGHT AT BEDTIME *MUST HAVE OFFICE VISIT FOR REFILLS* 04/08/17  Yes Tresa Garter, MD  losartan (COZAAR) 50 MG tablet TAKE 1 TABLET BY MOUTH ONCE DAILY 11/16/17  Yes Larey Dresser, MD  nitroGLYCERIN (NITROSTAT) 0.4 MG SL tablet Place 1 tablet (0.4 mg total) under the tongue every 5 (five) minutes as needed for chest pain. 08/09/17  Yes Bensimhon, Shaune Pascal, MD  NOVOLOG FLEXPEN 100 UNIT/ML FlexPen INJECT 15-20 UNITS SUBCUTANEOUSLY 3 TIMES A DAY WITH MEALS. 05/06/17  Yes Hammer, Aurora Mask, RPH-CPP  omeprazole (PRILOSEC) 40 MG capsule TAKE 1 CAPSULE BY MOUTH TWICE DAILY. 04/08/17  Yes Tresa Garter, MD  prasugrel (EFFIENT) 10 MG TABS tablet Take 1 tablet (10 mg total) by mouth daily. 11/04/16  Yes Larey Dresser, MD  prazosin (MINIPRESS) 5 MG capsule TAKE 1 CAPSULE BY MOUTH EVERY NIGHT AT BEDTIME FOR NIGHTMARES. 04/08/17  Yes Jegede, Marlena Clipper, MD  PROAIR  HFA 108 (90 Base) MCG/ACT inhaler INHALE 2 PUFFS BY MOUTH 4 TIMES DAILY AS NEEDED 01/05/18  Yes Rigoberto Noel, MD  rosuvastatin (CRESTOR) 10 MG tablet TAKE ONE TABLET BY MOUTH DAILY. 08/09/17  Yes Larey Dresser, MD  spironolactone (ALDACTONE) 25 MG tablet Take 1 tablet (25 mg total) by mouth daily. 10/06/16  Yes Jegede, Marlena Clipper, MD  ACCU-CHEK AVIVA PLUS test strip USE 1 STRIP TO CHECK GLUCOSE 3 TIMES DAILY 12/07/16   Tresa Garter, MD  ACCU-CHEK FASTCLIX LANCETS MISC 1 applicator by Does not apply route 4 (four) times daily -  before meals and at bedtime. 08/07/15   Charlott Rakes, MD  ACCU-CHEK SOFTCLIX LANCETS lancets USE 1 LANCET TO CHECK GLUCOSE 3 TIMES DAILY 01/14/16   Tresa Garter, MD  azithromycin (ZITHROMAX) 250 MG tablet Take 1 tablet (250 mg total) by mouth daily. Take first 2 tablets together, then 1 every day until finished. Patient not taking: Reported on 01/13/2018 07/25/17   Gareth Morgan, MD  Blood Glucose Monitoring Suppl (ACCU-CHEK  AVIVA) device Use as instructed 3 times daily before meals. 08/06/15   Charlott Rakes, MD  EASY TOUCH PEN NEEDLES 31G X 8 MM MISC USE AS DIRECTED TO INJECT INSULIN 01/14/16   Tresa Garter, MD  INSULIN SYRINGE .5CC/29G 29G X 1/2" 0.5 ML MISC Use as instructed by physician 03/28/14   Tresa Garter, MD  prasugrel (EFFIENT) 10 MG TABS tablet TAKE ONE TABLET BY MOUTH DAILY. Patient not taking: Reported on 01/13/2018 11/16/17   Larey Dresser, MD  promethazine (PHENERGAN) 25 MG tablet Take 1 tablet (25 mg total) by mouth every 6 (six) hours as needed for nausea or vomiting. Patient not taking: Reported on 01/13/2018 07/25/17   Gareth Morgan, MD  spironolactone (ALDACTONE) 25 MG tablet TAKE 1 TABLET BY MOUTH EVERY DAY Patient not taking: Reported on 01/13/2018 10/20/17   Larey Dresser, MD    Family History Family History  Problem Relation Age of Onset  . Stroke Mother   . Hypertension Mother   . Colon cancer Father   .  Diabetes Father   . Heart attack Father   . Sarcoidosis Other   . Breast cancer Paternal Aunt   . Emphysema Brother   . Lung disease Brother        Unknown type, 3 brothers, one with liver and lung disease  . Asthma Paternal Aunt     Social History Social History   Tobacco Use  . Smoking status: Former Smoker    Packs/day: 0.50    Years: 20.00    Pack years: 10.00    Types: Cigarettes    Last attempt to quit: 04/30/2014    Years since quitting: 3.7  . Smokeless tobacco: Never Used  . Tobacco comment: smoked off and on x 20 years  Substance Use Topics  . Alcohol use: No    Alcohol/week: 0.0 standard drinks  . Drug use: No     Allergies   Metolazone; Plavix [clopidogrel bisulfate]; and Nsaids   Review of Systems Review of Systems All other systems negative except as documented in the HPI. All pertinent positives and negatives as reviewed in the HPI.  Physical Exam Updated Vital Signs BP (!) 110/97 (BP Location: Right Arm)   Pulse 66   Temp 98.1 F (36.7 C) (Oral)   Resp 18   LMP 07/28/2013 Comment: perimenopausal  SpO2 100%   Physical Exam  Constitutional: She is oriented to person, place, and time. She appears well-developed and well-nourished. No distress.  HENT:  Head: Normocephalic and atraumatic.  Mouth/Throat: Oropharynx is clear and moist.  Eyes: Pupils are equal, round, and reactive to light.  Neck: Normal range of motion. Neck supple.  Cardiovascular: Normal rate, regular rhythm and normal heart sounds. Exam reveals no gallop and no friction rub.  No murmur heard. Pulmonary/Chest: Effort normal and breath sounds normal. No respiratory distress. She has no wheezes. She has no rales.  Abdominal: Soft. Bowel sounds are normal. She exhibits no distension. There is no tenderness.  Musculoskeletal: She exhibits edema. She exhibits no deformity.  Neurological: She is alert and oriented to person, place, and time. She exhibits normal muscle tone. Coordination  normal.  Skin: Skin is warm and dry. Capillary refill takes less than 2 seconds. No rash noted. No erythema.  Psychiatric: She has a normal mood and affect. Her behavior is normal.  Nursing note and vitals reviewed.    ED Treatments / Results  Labs (all labs ordered are listed, but only abnormal results are displayed)  Labs Reviewed  BASIC METABOLIC PANEL  CBC WITH DIFFERENTIAL/PLATELET    EKG None  Radiology No results found.  Procedures Procedures (including critical care time)  Medications Ordered in ED Medications - No data to display   Initial Impression / Assessment and Plan / ED Course  I have reviewed the triage vital signs and the nursing notes.  Pertinent labs & imaging results that were available during my care of the patient were reviewed by me and considered in my medical decision making (see chart for details).     Patient has multiple complaints which appear chronic.  The patient is requesting laboratory testing and refusing to leave without this.  We did get her set up with further oxygen for at home.  Patient has been stable here in the emergency department with no issues.  Final Clinical Impressions(s) / ED Diagnoses   Final diagnoses:  None    ED Discharge Orders    None       Dalia Heading, PA-C 01/13/18 [place], MD 01/13/18 1606

## 2018-01-13 NOTE — Care Management Note (Addendum)
Case Management Note  Patient Details  Name: Tamara Silva MRN: [id number] Date of Birth: [birth year]  CM consulted for home O2 DME needs.  Per Lawyer, PA, pt's home O2 was not working so she called EMS to bring a tank for her to use.  When they could not provide a tank, she requested they transport her to the ED.  CM contacted [facility] who will bring a tank to [facility] ED for pt to be transported back home.  Once home pt is to call [facility] and choose option 1 for them to come to her home for immediate service of her DME.   CM spoke with pt at bedside.  Pt acknowledged understanding but refused to go home without tests because "I can't go home the way I left."  CM discussed HH and noted how swollen pt's legs appears.  Discussed [facility].  Pt agreed to [facility] services after testing was complete and she was back home.  Updated Lawyer, [place] who entered [place] orders.  CM contacted Ronalee Belts with [facility] who accepted pt for services, including [employer] vest management, and home O2 needs.  CM also mentioned that the [facility] SW should work with pt on getting Medicare services due to her extensive medical hx.  Both agencies on AVS for pt's contact needs.  [facility] delivered O2 tank ot pt's room which will transport with pt via [facility] on transition home.  No further CM needs noted at this time.  Manfred Laspina, Benjaman Lobe, RN 01/13/2018, 1:55 PM

## 2018-01-13 NOTE — H&P (Addendum)
History and Physical    Tamara Silva SWF:[id number] DOB: Mar 30, 1961 DOA: 01/13/2018  PCP: Tresa Garter, MD  Patient coming from: Home  Chief Complaint: Ran out of oxygen battery   HPI: Tamara Silva is a 58 y.o. female with medical history significant of coronary artery disease, chronic systolic heart failure, essential hypertension, diabetes, hyperlipidemia who presents when her oxygen battery ran out.  She called EMS and when they could not replace her battery, she asked to be transferred to the emergency department.  In the emergency department, she asked that her labs be checked as she was on her way to see her pulmonologist anyway.  She did not have any acute complaints.  She states that she has been more short of breath over the past week to month and was going to address this with her pulmonologist at her routine follow-up appointment today. She normally wears 2 L of nasal cannula O2 at home, and had to bump it up to 2.5 L for the last month.  She denies any fevers, chills, chest pain, nausea, vomiting, abdominal pain, diarrhea.  She has had chronic constipation.  ED Course: She remained stable in the emergency department.  Labs were obtained at patient's request.  Her potassium was elevated 5.6, creatinine 1.62 from her baseline of 0.8, BNP 795.  Chest x-ray revealed mildly progressive changes of congestive heart failure with interval mild bilateral alveolar edema.  Hospitalist asked to admit patient for above issues.  Review of Systems: As per HPI otherwise 10 point review of systems negative.   Past Medical History:  Diagnosis Date  . Asthma   . Diabetes mellitus without complication [facility]     Past Surgical History:  Procedure Laterality Date  . COLONOSCOPY WITH PROPOFOL N/A 08/03/2013   Procedure: COLONOSCOPY WITH PROPOFOL;  Surgeon: Jeryl Columbia, MD;  Location: [facility] ENDOSCOPY;  Service: Endoscopy;  Laterality: N/A;  . CORONARY ANGIOPLASTY WITH STENT PLACEMENT     CAD in  2006 x 2 and 2009 2 more- place din REx in [place] and [place] med  . CORONARY ANGIOPLASTY WITH STENT PLACEMENT  10/07/2013   Xience [place] DES 2.75  mm x 15  mm  . ESOPHAGOGASTRODUODENOSCOPY (EGD) WITH PROPOFOL N/A 08/03/2013   Procedure: ESOPHAGOGASTRODUODENOSCOPY (EGD) WITH PROPOFOL;  Surgeon: Jeryl Columbia, MD;  Location: [facility] ENDOSCOPY;  Service: Endoscopy;  Laterality: N/A;  . LEFT HEART CATHETERIZATION WITH CORONARY ANGIOGRAM N/A 10/07/2013   Procedure: LEFT HEART CATHETERIZATION WITH CORONARY ANGIOGRAM;  Surgeon: Leonie Man, MD;  Location: [facility] CATH LAB;  Service: Cardiovascular;  Laterality: N/A;     reports that she quit smoking about 3 years ago. Her smoking use included cigarettes. She has a 10.00 pack-year smoking history. She has never used smokeless tobacco. She reports that she does not drink alcohol or use drugs.  Allergies  Allergen Reactions  . Metolazone Other (See Comments)    Dizziness and falling  . Plavix [Clopidogrel Bisulfate] Other (See Comments)    High PRU's - non-responder  . Nsaids Other (See Comments)    "I do not take NSAIDs d/t interfering with other meds"    Family History  Problem Relation Age of Onset  . Stroke Mother   . Hypertension Mother   . Colon cancer Father   . Diabetes Father   . Heart attack Father   . Sarcoidosis Other   . Breast cancer Paternal Aunt   . Emphysema Brother   . Lung disease Brother  Unknown type, 3 brothers, one with liver and lung disease  . Asthma Paternal Aunt     Prior to Admission medications   Medication Sig Start Date End Date Taking? Authorizing Provider  ANORO ELLIPTA 62.5-25 MCG/INH AEPB INHALE 1 PUFF BY MOUTH ONCE DAILY **MAKE AN APPOINTMENT WITH MD FOR MORE REFILLS** 01/06/18  Yes Byrum, Rose Fillers, MD  aspirin 81 MG tablet Take 1 tablet (81 mg total) by mouth daily. 10/09/13  Yes Barrett, Evelene Croon, PA-C  carvedilol (COREG) 12.5 MG tablet TAKE 1 TABLET BY MOUTH 2 TIMES A DAY WITH A MEAL 01/05/18  Yes Larey Dresser, MD  fluticasone (FLONASE) 50 MCG/ACT nasal spray INSTILL 2 SPRAYS IN EACH NOSTRIL TWICE DAILY. 04/08/17  Yes Tresa Garter, MD  furosemide (LASIX) 40 MG tablet Take 40 mg by mouth daily as needed for fluid.    Yes [provider]  ipratropium-albuterol (DUONEB) 0.5-2.5 (3) MG/3ML SOLN INHALE THE CONTENTS OF 1 VIAL VIA NEBULIZER EVERY 6 HOURS AS NEEDED 01/05/18  Yes Rigoberto Noel, MD  LEVEMIR 100 UNIT/ML injection INJECT 45 UNITS SUBCUTANEOUSLY EVERY NIGHT AT BEDTIME *MUST HAVE OFFICE VISIT FOR REFILLS* 04/08/17  Yes Tresa Garter, MD  losartan (COZAAR) 50 MG tablet TAKE 1 TABLET BY MOUTH ONCE DAILY 11/16/17  Yes Larey Dresser, MD  nitroGLYCERIN (NITROSTAT) 0.4 MG SL tablet Place 1 tablet (0.4 mg total) under the tongue every 5 (five) minutes as needed for chest pain. 08/09/17  Yes Bensimhon, Shaune Pascal, MD  NOVOLOG FLEXPEN 100 UNIT/ML FlexPen INJECT 15-20 UNITS SUBCUTANEOUSLY 3 TIMES A DAY WITH MEALS. 05/06/17  Yes Hammer, Aurora Mask, RPH-CPP  omeprazole (PRILOSEC) 40 MG capsule TAKE 1 CAPSULE BY MOUTH TWICE DAILY. 04/08/17  Yes Tresa Garter, MD  prasugrel (EFFIENT) 10 MG TABS tablet Take 1 tablet (10 mg total) by mouth daily. 11/04/16  Yes Larey Dresser, MD  prazosin (MINIPRESS) 5 MG capsule TAKE 1 CAPSULE BY MOUTH EVERY NIGHT AT BEDTIME FOR NIGHTMARES. 04/08/17  Yes Jegede, Marlena Clipper, MD  PROAIR HFA 108 (90 Base) MCG/ACT inhaler INHALE 2 PUFFS BY MOUTH 4 TIMES DAILY AS NEEDED 01/05/18  Yes Rigoberto Noel, MD  rosuvastatin (CRESTOR) 10 MG tablet TAKE ONE TABLET BY MOUTH DAILY. 08/09/17  Yes Larey Dresser, MD  spironolactone (ALDACTONE) 25 MG tablet Take 1 tablet (25 mg total) by mouth daily. 10/06/16  Yes Jegede, Marlena Clipper, MD  ACCU-CHEK AVIVA PLUS test strip USE 1 STRIP TO CHECK GLUCOSE 3 TIMES DAILY 12/07/16   Tresa Garter, MD  ACCU-CHEK FASTCLIX LANCETS MISC 1 applicator by Does not apply route 4 (four) times daily -  before meals and at bedtime. 08/07/15    Charlott Rakes, MD  ACCU-CHEK SOFTCLIX LANCETS lancets USE 1 LANCET TO CHECK GLUCOSE 3 TIMES DAILY 01/14/16   Tresa Garter, MD  azithromycin (ZITHROMAX) 250 MG tablet Take 1 tablet (250 mg total) by mouth daily. Take first 2 tablets together, then 1 every day until finished. Patient not taking: Reported on 01/13/2018 07/25/17   Gareth Morgan, MD  Blood Glucose Monitoring Suppl (ACCU-CHEK AVIVA) device Use as instructed 3 times daily before meals. 08/06/15   Charlott Rakes, MD  EASY TOUCH PEN NEEDLES 31G X 8 MM MISC USE AS DIRECTED TO INJECT INSULIN 01/14/16   Tresa Garter, MD  INSULIN SYRINGE .5CC/29G 29G X 1/2" 0.5 ML MISC Use as instructed by physician 03/28/14   Tresa Garter, MD  prasugrel (EFFIENT) 10 MG TABS tablet  TAKE ONE TABLET BY MOUTH DAILY. Patient not taking: Reported on 01/13/2018 11/16/17   Larey Dresser, MD  promethazine (PHENERGAN) 25 MG tablet Take 1 tablet (25 mg total) by mouth every 6 (six) hours as needed for nausea or vomiting. Patient not taking: Reported on 01/13/2018 07/25/17   Gareth Morgan, MD  spironolactone (ALDACTONE) 25 MG tablet TAKE 1 TABLET BY MOUTH EVERY DAY Patient not taking: Reported on 01/13/2018 10/20/17   Larey Dresser, MD    Physical Exam: Vitals:   01/13/18 1139 01/13/18 1230  BP: (!) 110/97 122/61  Pulse: 66 65  Resp: 18   Temp: 98.1 F (36.7 C)   TempSrc: Oral   SpO2: 100% 100%     Constitutional: NAD, calm, comfortable  Eyes: PERRL, lids and conjunctivae normal  ENMT: Mucous membranes are moist. Posterior pharynx clear of any exudate or lesions.Normal dentition.  Neck: normal, supple, no masses, no thyromegaly Respiratory: Diminished breath sounds bilaterally, no wheeze crackles or rhonchi.  No increase in respiratory effort.  No conversational dyspnea.  On nasal cannula O2 2 L.  Cardiovascular: Regular rate and rhythm, no murmurs / rubs / gallops. +1 pitting edema bilaterally Abdomen: no tenderness, no masses  palpated. No hepatosplenomegaly. Bowel sounds positive.  Musculoskeletal: no clubbing / cyanosis. No joint deformity upper and lower extremities. Good ROM, no contractures. Normal muscle tone.  Skin: no rashes, lesions, ulcers. No induration Neurologic: CN 2-12 grossly intact. Strength 5/5 in all 4.  Psychiatric: Normal judgment and insight. Alert and oriented x 3. Normal mood.   Labs on Admission: I have personally reviewed following labs and imaging studies  CBC: Recent Labs  Lab 01/13/18 1529  WBC 6.4  NEUTROABS 3.8  HGB 7.9*  HCT 31.4*  MCV 69.5*  PLT 470   Basic Metabolic Panel: Recent Labs  Lab 01/13/18 1529  NA 137  K 5.6*  CL 103  CO2 28  GLUCOSE 342*  BUN 29*  CREATININE 1.62*  CALCIUM 8.4*   GFR: CrCl cannot be calculated (Unknown ideal weight.). Liver Function Tests: No results for input(s): AST, ALT, ALKPHOS, BILITOT, PROT, ALBUMIN in the last 168 hours. No results for input(s): LIPASE, AMYLASE in the last 168 hours. No results for input(s): AMMONIA in the last 168 hours. Coagulation Profile: No results for input(s): INR, PROTIME in the last 168 hours. Cardiac Enzymes: No results for input(s): CKTOTAL, CKMB, CKMBINDEX, TROPONINI in the last 168 hours. BNP (last 3 results) No results for input(s): PROBNP in the last 8760 hours. HbA1C: No results for input(s): HGBA1C in the last 72 hours. CBG: No results for input(s): GLUCAP in the last 168 hours. Lipid Profile: No results for input(s): CHOL, HDL, LDLCALC, TRIG, CHOLHDL, LDLDIRECT in the last 72 hours. Thyroid Function Tests: No results for input(s): TSH, T4TOTAL, FREET4, T3FREE, THYROIDAB in the last 72 hours. Anemia Panel: No results for input(s): VITAMINB12, FOLATE, FERRITIN, TIBC, IRON, RETICCTPCT in the last 72 hours. Urine analysis:    Component Value Date/Time   COLORURINE YELLOW 01/13/2018 1635   APPEARANCEUR HAZY (A) 01/13/2018 1635   LABSPEC 1.021 01/13/2018 1635   PHURINE 5.0 01/13/2018  1635   GLUCOSEU >=500 (A) 01/13/2018 1635   HGBUR NEGATIVE 01/13/2018 1635   BILIRUBINUR NEGATIVE 01/13/2018 1635   KETONESUR NEGATIVE 01/13/2018 1635   PROTEINUR 100 (A) 01/13/2018 1635   UROBILINOGEN 2.0 (H) 02/21/2015 1914   NITRITE NEGATIVE 01/13/2018 1635   LEUKOCYTESUR NEGATIVE 01/13/2018 1635   Sepsis Labs: !!!!!!!!!!!!!!!!!!!!!!!!!!!!!!!!!!!!!!!!!!!! @LABRCNTIP (procalcitonin:4,lacticidven:4) )No results found for this  or any previous visit (from the past 240 hour(s)).   Radiological Exams on Admission: Dg Chest 2 View  Result Date: 01/13/2018 CLINICAL DATA:  Shortness of breath. EXAM: CHEST - 2 VIEW COMPARISON:  09/12/2017. FINDINGS: Stable enlarged cardiac silhouette and diffuse prominence of the pulmonary vasculature and interstitial markings. Increased mild patchy opacity throughout both lungs. Minimal bilateral pleural fluid. Unremarkable bones. IMPRESSION: Mildly progressive changes of congestive heart failure with interval mild bilateral alveolar edema. Electronically Signed   By: Claudie Revering M.D.   On: 01/13/2018 17:06    EKG: Independently reviewed.  Normal sinus rhythm with right axis deviation  Assessment/Plan Principal Problem:   Acute on chronic combined systolic (congestive) and diastolic (congestive) heart failure (HCC)   Acute on chronic combined systolic and diastolic heart failure  -Previous echocardiogram in April 2018 showed EF 35 to 23%, grade 2 diastolic dysfunction -Chest x-ray revealed bilateral alveolar edema, patient also presents with bilateral pitting edema of lower extremities, BNP 795 -She states that she has been taking Lasix 80 mg twice daily (was actually prescribed 40 mg as needed daily) without improvement in her lower extremity edema -Repeat echocardiogram -Start IV Lasix 80 mg twice daily -Strict I's and O's, daily weight -Continue coreg   Chronic hypoxemic respiratory failure -Uses 2 L nasal cannula at baseline -O2 goal  88-92%  Acute kidney injury  -Baseline creatinine 0.8-0.9  -Monitor creatinine closely while on IV Lasix -Hold cozaar due to AKI   Mild hyperkalemia -IV Lasix as above  CAD -Continue aspirin, effient   Chronic microcytic anemia -Check iron studies, baseline hemoglobin 8-9   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 -Lantus 45u qhs, SSI   HTN -Continue coreg -Hold cozaar due to AKI and hyperkalemia  -Hold spironolactone due to hyperkalemia   HLD -Continue crestor   OSA -CPAP qhs   Constipation -Bowel regimen ordered   DVT prophylaxis: Subq hep Code Status: Full  Family Communication: No family at bedside Disposition Plan: Pending improvement Consults called: None  Admission status: Inpatient    * I certify that at the point of admission it is my clinical judgment that the patient will require inpatient hospital care spanning beyond 2 midnights from the point of admission due to high intensity of service, high risk for further deterioration and high frequency of surveillance required.*   Dessa Phi, DO [facility] [email or web address] Password [facility] 01/13/2018, 6:29 PM

## 2018-01-13 NOTE — ED Notes (Signed)
BLOOD DRAW ATTEMPTS X2. LAB HAS BEEN CALLED. PHLEBOTOMY IS JUST GETTING IN AND WILL COME AS SOON AS THEY CAN.

## 2018-01-13 NOTE — ED Triage Notes (Signed)
Patient was walking out to car to go to doctor and was SOB. Portable O2 tank battery is dead because it wasn't plugged in last night. EMS put her on 2L on arrival and sats went up to 99%. Here because she wants [place] to give her a new tank.

## 2018-01-13 NOTE — ED Provider Notes (Signed)
Patient received at shift change by Irena Cords PA. Blood work, Insurance account manager and urinalysis pending prior to hospitalist consult for admission. Creatine level today 1.6 from 0/8 in April BNP 795.9 DG Chest showed mild progressive changes of congestive heart failure with interval mild BL alveolar edema.   5:47 PM Spoke to Dr. Maylene Roes who will come in to see the patient and possible admission for AKI.  5:49 PM Spoke to patient who is aware she will be admitted and explained to her the changes in her creatine levels from April.She agrees and understand the plan.    Janeece Fitting, PA-C 01/13/18 Hatton, Ankit, MD 01/14/18 1558

## 2018-01-13 NOTE — ED Notes (Signed)
Bed: WM68 Expected date:  Expected time:  Means of arrival:  Comments: 57 yo SOB

## 2018-01-13 NOTE — ED Notes (Signed)
ED TO INPATIENT HANDOFF REPORT  Name/Age/Gender Tamara Silva 57 y.o. female  Code Status Code Status History    Date Active Date Inactive Code Status Order ID Comments User Context   06/21/2015 0304 06/29/2015 1641 Full Code [id number]  Edwin Dada, MD Inpatient   05/21/2015 0045 05/26/2015 1440 Full Code [id number]  Edwin Dada, MD Inpatient   03/04/2014 2146 03/08/2014 1900 Full Code [id number]  Shanda Howells, MD ED   02/06/2014 1808 02/08/2014 2232 Full Code [id number]  Debbe Odea, MD ED   10/07/2013 0213 10/09/2013 1802 Full Code [id number]  Leonie Man, MD Inpatient   07/29/2013 0136 08/04/2013 1821 Full Code [id number]  Toy Baker, MD Inpatient   07/10/2013 0642 07/16/2013 1639 Full Code [id number]  Berle Mull, MD Inpatient   07/01/2013 0423 07/05/2013 1733 Full Code [id number]  Theressa Millard, MD ED   11/27/2012 0459 11/28/2012 1556 Full Code [id number]  Etta Quill., DO ED      Home/SNF/Other Home  Chief Complaint shob  Level of Care/Admitting Diagnosis ED Disposition    ED Disposition Condition [facility]: [facility] [[id number]]  Level of Care: Telemetry [5]  Admit to tele based on following criteria: Acute CHF  Diagnosis: Acute on chronic combined systolic (congestive) and diastolic (congestive) heart failure [facility]) [[id number]]  Admitting Physician: Dessa Phi [phone]  Attending Physician: Dessa Phi [phone]  Estimated length of stay: past midnight tomorrow  Certification:: I certify this patient will need inpatient services for at least 2 midnights  PT Class (Do Not Modify): Inpatient [101]  PT Acc Code (Do Not Modify): Private [1]       Medical History Past Medical History:  Diagnosis Date  . Asthma   . Diabetes mellitus without complication (HCC)     Allergies Allergies  Allergen Reactions  . Metolazone Other (See Comments)    Dizziness and falling  . Plavix [Clopidogrel  Bisulfate] Other (See Comments)    High PRU's - non-responder  . Nsaids Other (See Comments)    "I do not take NSAIDs d/t interfering with other meds"    IV Location/Drains/Wounds Patient Lines/Drains/Airways Status   Active Line/Drains/Airways    Name:   Placement date:   Placement time:   Site:   Days:   Peripheral IV 01/13/18 Left;Anterior Forearm   01/13/18    1754    Forearm   less than 1          Labs/Imaging Results for orders placed or performed during the hospital encounter of 01/13/18 (from the past 48 hour(s))  Basic metabolic panel     Status: Abnormal   Collection Time: 01/13/18  3:29 PM  Result Value Ref Range   Sodium 137 135 - 145 mmol/L   Potassium 5.6 (H) 3.5 - 5.1 mmol/L   Chloride 103 98 - 111 mmol/L   CO2 28 22 - 32 mmol/L   Glucose, Bld 342 (H) 70 - 99 mg/dL   BUN 29 (H) 6 - 20 mg/dL   Creatinine, Ser 1.62 (H) 0.44 - 1.00 mg/dL   Calcium 8.4 (L) 8.9 - 10.3 mg/dL   GFR calc non Af Amer 34 (L) >60 mL/min   GFR calc Af Amer 39 (L) >60 mL/min    Comment: (NOTE) The eGFR has been calculated using the CKD EPI equation. This calculation has not been validated in all clinical situations. eGFR's persistently <60 mL/min signify possible Chronic Kidney Disease.    Anion gap 6 5 -  15    Comment: Performed at [facility] [place], [place] [address], [place], [place] [address]  CBC with Differential     Status: Abnormal   Collection Time: 01/13/18  3:29 PM  Result Value Ref Range   WBC 6.4 4.0 - 10.5 K/uL   RBC 4.52 3.87 - 5.11 MIL/uL   Hemoglobin 7.9 (L) 12.0 - 15.0 g/dL   HCT 31.4 (L) 36.0 - 46.0 %   MCV 69.5 (L) 78.0 - 100.0 fL   MCH 17.5 (L) 26.0 - 34.0 pg   MCHC 25.2 (L) 30.0 - 36.0 g/dL   RDW 24.5 (H) 11.5 - 15.5 %   Platelets 206 150 - 400 K/uL    Comment: SPECIMEN CHECKED FOR CLOTS REPEATED TO VERIFY PLATELET COUNT CONFIRMED BY SMEAR    Neutrophils Relative % 61 %   Lymphocytes Relative 26 %   Monocytes Relative 8 %   Eosinophils  Relative 4 %   Basophils Relative 1 %   Neutro Abs 3.8 1.7 - 7.7 K/uL   Lymphs Abs 1.7 0.7 - 4.0 K/uL   Monocytes Absolute 0.5 0.1 - 1.0 K/uL   Eosinophils Absolute 0.3 0.0 - 0.7 K/uL   Basophils Absolute 0.1 0.0 - 0.1 K/uL   RBC Morphology POLYCHROMASIA PRESENT     Comment: TARGET CELLS TEARDROP CELLS Performed at [place] [address] [place], [place] [address]   Brain natriuretic peptide     Status: Abnormal   Collection Time: 01/13/18  4:18 PM  Result Value Ref Range   B Natriuretic Peptide 795.9 (H) 0.0 - 100.0 pg/mL    Comment: Performed at [facility], [place] [address] [place], [place] [address]  Urinalysis, Routine w reflex microscopic     Status: Abnormal   Collection Time: 01/13/18  4:35 PM  Result Value Ref Range   Color, Urine YELLOW YELLOW   APPearance HAZY (A) CLEAR   Specific Gravity, Urine 1.021 1.005 - 1.030   pH 5.0 5.0 - 8.0   Glucose, UA >=500 (A) NEGATIVE mg/dL   Hgb urine dipstick NEGATIVE NEGATIVE   Bilirubin Urine NEGATIVE NEGATIVE   Ketones, ur NEGATIVE NEGATIVE mg/dL   Protein, ur 100 (A) NEGATIVE mg/dL   Nitrite NEGATIVE NEGATIVE   Leukocytes, UA NEGATIVE NEGATIVE   RBC / HPF 0-5 0 - 5 RBC/hpf   WBC, UA 6-10 0 - 5 WBC/hpf   Bacteria, UA MANY (A) NONE SEEN   Squamous Epithelial / LPF 11-20 0 - 5   Hyaline Casts, UA PRESENT     Comment: Performed at [facility], [place] [address] [place], [place] [address]   Dg Chest 2 View  Result Date: 01/13/2018 CLINICAL DATA:  Shortness of breath. EXAM: CHEST - 2 VIEW COMPARISON:  09/12/2017. FINDINGS: Stable enlarged cardiac silhouette and diffuse prominence of the pulmonary vasculature and interstitial markings. Increased mild patchy opacity throughout both lungs. Minimal bilateral pleural fluid. Unremarkable bones. IMPRESSION: Mildly progressive changes of congestive heart failure with interval mild bilateral alveolar edema. Electronically Signed    By: Claudie Revering M.D.   On: 01/13/2018 17:06    Pending Labs [employer] (From admission, onward)    Start     Ordered   Signed and Held  HIV antibody (Routine Testing)  Once,   R     Signed and Held   Signed and Held  CBC  Tomorrow morning,   R     Signed and Held   Signed and  Held  Basic metabolic panel  Tomorrow morning,   R     Signed and Held   Signed and Held  Ferritin  Tomorrow morning,   R     Signed and Held   Signed and Held  Iron and TIBC  Tomorrow morning,   R     Signed and Held          Vitals/Pain Today's Vitals   01/13/18 1136 01/13/18 1139 01/13/18 1230  BP:  (!) 110/97 122/61  Pulse:  66 65  Resp:  18   Temp:  98.1 F (36.7 C)   TempSrc:  Oral   SpO2:  100% 100%  PainSc: 0-No pain      Isolation Precautions No active isolations  Medications Medications  sodium chloride 0.9 % bolus 500 mL (500 mLs Intravenous New Bag/Given 01/13/18 1857)  albuterol (PROVENTIL) (2.5 MG/3ML) 0.083% nebulizer solution 5 mg (5 mg Nebulization Given 01/13/18 1856)  insulin aspart (novoLOG) injection 6 Units (6 Units Intravenous Given 01/13/18 1856)    Mobility walks with person assist

## 2018-01-14 ENCOUNTER — Inpatient Hospital Stay (HOSPITAL_COMMUNITY): Payer: Medicaid Other

## 2018-01-14 DIAGNOSIS — I361 Nonrheumatic tricuspid (valve) insufficiency: Secondary | ICD-10-CM

## 2018-01-14 LAB — CBC
HCT: 32.7 % — ABNORMAL LOW (ref 36.0–46.0)
HEMOGLOBIN: 8.2 g/dL — AB (ref 12.0–15.0)
MCH: 17.6 pg — AB (ref 26.0–34.0)
MCHC: 25.1 g/dL — ABNORMAL LOW (ref 30.0–36.0)
MCV: 70 fL — ABNORMAL LOW (ref 78.0–100.0)
Platelets: 255 10*3/uL (ref 150–400)
RBC: 4.67 MIL/uL (ref 3.87–5.11)
RDW: 24.7 % — ABNORMAL HIGH (ref 11.5–15.5)
WBC: 7.6 10*3/uL (ref 4.0–10.5)

## 2018-01-14 LAB — BASIC METABOLIC PANEL
ANION GAP: 7 (ref 5–15)
BUN: 30 mg/dL — AB (ref 6–20)
CHLORIDE: 100 mmol/L (ref 98–111)
CO2: 29 mmol/L (ref 22–32)
Calcium: 8.5 mg/dL — ABNORMAL LOW (ref 8.9–10.3)
Creatinine, Ser: 1.43 mg/dL — ABNORMAL HIGH (ref 0.44–1.00)
GFR calc Af Amer: 46 mL/min — ABNORMAL LOW (ref >60)
GFR calc non Af Amer: 40 mL/min — ABNORMAL LOW (ref >60)
GLUCOSE: 218 mg/dL — AB (ref 70–99)
POTASSIUM: 5.6 mmol/L — AB (ref 3.5–5.1)
Sodium: 136 mmol/L (ref 135–145)

## 2018-01-14 LAB — IRON AND TIBC
IRON: 27 ug/dL — AB (ref 28–170)
SATURATION RATIOS: 5 % — AB (ref 10.4–31.8)
TIBC: 524 ug/dL — ABNORMAL HIGH (ref 250–450)
UIBC: 497 ug/dL

## 2018-01-14 LAB — GLUCOSE, CAPILLARY
GLUCOSE-CAPILLARY: 265 mg/dL — AB (ref 70–99)
GLUCOSE-CAPILLARY: 120 mg/dL — AB (ref 70–99)
Glucose-Capillary: 190 mg/dL — ABNORMAL HIGH (ref 70–99)
Glucose-Capillary: 228 mg/dL — ABNORMAL HIGH (ref 70–99)

## 2018-01-14 LAB — FERRITIN: Ferritin: 6 ng/mL — ABNORMAL LOW (ref 11–307)

## 2018-01-14 LAB — ECHOCARDIOGRAM COMPLETE
Height: 67 in
Weight: 5121.6 oz

## 2018-01-14 LAB — HIV ANTIBODY (ROUTINE TESTING W REFLEX): HIV Screen 4th Generation wRfx: NONREACTIVE

## 2018-01-14 MED ORDER — PERFLUTREN LIPID MICROSPHERE
1.0000 mL | INTRAVENOUS | Status: AC | PRN
  Filled 2018-01-14: qty 10

## 2018-01-14 MED ORDER — SODIUM CHLORIDE 0.9 % IV SOLN
510.0000 mg | Freq: Once | INTRAVENOUS | Status: AC
  Administered 2018-01-14: 510 mg via INTRAVENOUS
  Filled 2018-01-14: qty 17

## 2018-01-14 MED ORDER — PERFLUTREN LIPID MICROSPHERE
1.0000 mL | INTRAVENOUS | Status: AC | PRN
  Administered 2018-01-14: 2 mL via INTRAVENOUS
  Filled 2018-01-14: qty 10

## 2018-01-14 NOTE — Progress Notes (Signed)
  Echocardiogram 2D Echocardiogram has been performed.  Tamara Silva F 01/14/2018, 10:31 AM

## 2018-01-14 NOTE — Progress Notes (Signed)
Asked patient if she was ready for her CPAP and patient would not respond to this Probation officer.  This Probation officer spoke to [employer] and he stated that pt. Was upset and he would attempt to place on her later this shift.

## 2018-01-14 NOTE — Progress Notes (Signed)
PROGRESS NOTE    Tamara Silva  JYN:[id number] DOB: 29-Jul-1960 DOA: 01/13/2018 PCP: Tresa Garter, MD     Brief Narrative:  Tamara Silva is a 57 y.o. female with medical history significant of coronary artery disease, chronic systolic heart failure, essential hypertension, diabetes, hyperlipidemia who presents when her oxygen battery ran out.  She called EMS and when they could not replace her battery, she asked to be transferred to the emergency department.  In the emergency department, she asked that her labs be checked as she was on her way to see her pulmonologist anyway.  She did not have any acute complaints.  She states that she has been more short of breath over the past week to month and was going to address this with her pulmonologist at her routine follow-up appointment today. She normally wears 2 L of nasal cannula O2 at home, and had to bump it up to 2.5 L for the last month.  She denies any fevers, chills, chest pain, nausea, vomiting, abdominal pain, diarrhea.  She has had chronic constipation. She remained stable in the emergency department.  Labs were obtained at patient's request.  Her potassium was elevated 5.6, creatinine 1.62 from her baseline of 0.8, BNP 795.  Chest x-ray revealed mildly progressive changes of congestive heart failure with interval mild bilateral alveolar edema.  Patient was admitted for acute on chronic combined systolic and diastolic heart failure.  New events last 24 hours / Subjective: No acute events.  She states that last night was "up and down" but did not further specify any new symptoms or complaints.  States that she has been urinating a lot.  Denies any shortness of breath or chest pain today.  Assessment & Plan:   Principal Problem:   Acute on chronic combined systolic (congestive) and diastolic (congestive) heart failure (HCC)   Acute on chronic combined systolic and diastolic heart failure  -Previous echocardiogram in April 2018  showed EF 35 to 86%, grade 2 diastolic dysfunction -Chest x-ray revealed bilateral alveolar edema, patient also presents with bilateral pitting edema of lower extremities, BNP 795 -She states that she has been taking Lasix 80 mg twice daily (was actually prescribed 40 mg as needed daily) without improvement in her lower extremity edema -Repeat echocardiogram pending  -Start IV Lasix 80 mg twice daily -Strict I's and O's, daily weight -Continue coreg  -UOP -1.9L and weight -3.7lb since admission  Chronic hypoxemic respiratory failure -Uses 2 L nasal cannula at baseline -O2 goal 88-92%  Acute kidney injury  -Baseline creatinine 0.8-0.9  -Monitor creatinine closely while on IV Lasix -Hold cozaar due to AKI  -Improved   Mild hyperkalemia -IV Lasix as above -Monitor BMP  CAD -Continue aspirin, effient   Chronic microcytic anemia, iron deficiency anemia -Baseline hemoglobin 8-9  -IV feraheme today   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 -Lantus 45u qhs, SSI   HTN -Continue coreg -Hold cozaar due to AKI and hyperkalemia  -Hold spironolactone due to hyperkalemia   HLD -Continue crestor   OSA -CPAP qhs   Constipation -Bowel regimen ordered   DVT prophylaxis: Subq hep Code Status: Full Family Communication: No family at bedside Disposition Plan: Pending improvement   Consultants:   None  Procedures:   None   Antimicrobials:  Anti-infectives (From admission, onward)   None       Objective: Vitals:   01/13/18 1956 01/14/18 0403 01/14/18 0414 01/14/18 0856  BP: (!) 139/113 (!) 122/93    Pulse: 77  73    Resp: 16 20    Temp: 97.8 F (36.6 C) 100.2 F (37.9 C) 98.5 F (36.9 C)   TempSrc: Oral Oral Oral   SpO2: 93% 100%  99%  Weight:  (!) 145.2 kg    Height:        Intake/Output Summary (Last 24 hours) at 01/14/2018 1138 Last data filed at 01/14/2018 1000 Gross per 24 hour  Intake 755.41 ml  Output 2300 ml    Net -1544.59 ml   Filed Weights   01/13/18 1955 01/14/18 0403  Weight: (!) 146.9 kg (!) 145.2 kg    Examination:  General exam: Appears calm and comfortable  Respiratory system: Diminished breath sounds bilaterally due to body habitus, no respiratory distress or increased effort Cardiovascular system: S1 & S2 heard, RRR. No JVD, murmurs, rubs, gallops or clicks. + Bilateral pedal edema Gastrointestinal system: Abdomen is nondistended, soft and nontender. No organomegaly or masses felt. Normal bowel sounds heard. Central nervous system: Alert and oriented. No focal neurological deficits. Extremities: Symmetric 5 x 5 power. Skin: No rashes, lesions or ulcers Psychiatry: Judgement and insight appear normal. Mood & affect appropriate.   Data Reviewed: I have personally reviewed following labs and imaging studies  CBC: Recent Labs  Lab 01/13/18 1529 01/14/18 0412  WBC 6.4 7.6  NEUTROABS 3.8  --   HGB 7.9* 8.2*  HCT 31.4* 32.7*  MCV 69.5* 70.0*  PLT 206 748   Basic Metabolic Panel: Recent Labs  Lab 01/13/18 1529 01/14/18 0412  NA 137 136  K 5.6* 5.6*  CL 103 100  CO2 28 29  GLUCOSE 342* 218*  BUN 29* 30*  CREATININE 1.62* 1.43*  CALCIUM 8.4* 8.5*   GFR: Estimated Creatinine Clearance: 64.3 mL/min (A) (by C-G formula based on SCr of 1.43 mg/dL (H)). Liver Function Tests: No results for input(s): AST, ALT, ALKPHOS, BILITOT, PROT, ALBUMIN in the last 168 hours. No results for input(s): LIPASE, AMYLASE in the last 168 hours. No results for input(s): AMMONIA in the last 168 hours. Coagulation Profile: No results for input(s): INR, PROTIME in the last 168 hours. Cardiac Enzymes: No results for input(s): CKTOTAL, CKMB, CKMBINDEX, TROPONINI in the last 168 hours. BNP (last 3 results) No results for input(s): PROBNP in the last 8760 hours. HbA1C: No results for input(s): HGBA1C in the last 72 hours. CBG: Recent Labs  Lab 01/13/18 2214 01/14/18 0741  GLUCAP 269* 190*    Lipid Profile: No results for input(s): CHOL, HDL, LDLCALC, TRIG, CHOLHDL, LDLDIRECT in the last 72 hours. Thyroid Function Tests: No results for input(s): TSH, T4TOTAL, FREET4, T3FREE, THYROIDAB in the last 72 hours. Anemia Panel: Recent Labs    01/14/18 0412  FERRITIN 6*  TIBC 524*  IRON 27*   Sepsis Labs: No results for input(s): PROCALCITON, LATICACIDVEN in the last 168 hours.  No results found for this or any previous visit (from the past 240 hour(s)).     Radiology Studies: Dg Chest 2 View  Result Date: 01/13/2018 CLINICAL DATA:  Shortness of breath. EXAM: CHEST - 2 VIEW COMPARISON:  09/12/2017. FINDINGS: Stable enlarged cardiac silhouette and diffuse prominence of the pulmonary vasculature and interstitial markings. Increased mild patchy opacity throughout both lungs. Minimal bilateral pleural fluid. Unremarkable bones. IMPRESSION: Mildly progressive changes of congestive heart failure with interval mild bilateral alveolar edema. Electronically Signed   By: Claudie Revering M.D.   On: 01/13/2018 17:06      Scheduled Meds: . aspirin EC  81 mg Oral Daily  .  carvedilol  12.5 mg Oral BID WC  . fluticasone  2 spray Each Nare Daily  . furosemide  80 mg Intravenous BID  . heparin  5,000 Units Subcutaneous Q8H  . insulin aspart  0-15 Units Subcutaneous TID WC  . insulin aspart  0-5 Units Subcutaneous QHS  . insulin glargine  45 Units Subcutaneous QHS  . pantoprazole  80 mg Oral Daily  . polyethylene glycol  17 g Oral Daily  . prasugrel  10 mg Oral Daily  . rosuvastatin  10 mg Oral Daily  . senna  1 tablet Oral BID  . sodium chloride flush  3 mL Intravenous Q12H  . sodium chloride flush  3 mL Intravenous Q12H  . umeclidinium-vilanterol  1 puff Inhalation Daily   Continuous Infusions: . sodium chloride 250 mL (01/14/18 0545)     LOS: 1 day    Time spent: 25 minutes   Tamara Phi, DO [facility] [email or web address] Password [facility] 01/14/2018, 11:38 AM

## 2018-01-15 LAB — BASIC METABOLIC PANEL
Anion gap: 6 (ref 5–15)
BUN: 27 mg/dL — ABNORMAL HIGH (ref 6–20)
CHLORIDE: 100 mmol/L (ref 98–111)
CO2: 31 mmol/L (ref 22–32)
Calcium: 8.5 mg/dL — ABNORMAL LOW (ref 8.9–10.3)
Creatinine, Ser: 1.22 mg/dL — ABNORMAL HIGH (ref 0.44–1.00)
GFR calc non Af Amer: 48 mL/min — ABNORMAL LOW (ref >60)
GFR, EST AFRICAN AMERICAN: 55 mL/min — AB (ref >60)
Glucose, Bld: 82 mg/dL (ref 70–99)
POTASSIUM: 4.5 mmol/L (ref 3.5–5.1)
SODIUM: 137 mmol/L (ref 135–145)

## 2018-01-15 LAB — CBC
HCT: 28.5 % — ABNORMAL LOW (ref 36.0–46.0)
HEMOGLOBIN: 7.2 g/dL — AB (ref 12.0–15.0)
MCH: 18 pg — ABNORMAL LOW (ref 26.0–34.0)
MCHC: 25.3 g/dL — ABNORMAL LOW (ref 30.0–36.0)
MCV: 71.1 fL — ABNORMAL LOW (ref 78.0–100.0)
PLATELETS: 135 10*3/uL — AB (ref 150–400)
RBC: 4.01 MIL/uL (ref 3.87–5.11)
RDW: 24.4 % — ABNORMAL HIGH (ref 11.5–15.5)
WBC: 7.7 10*3/uL (ref 4.0–10.5)

## 2018-01-15 LAB — PREPARE RBC (CROSSMATCH)

## 2018-01-15 LAB — ABO/RH: ABO/RH(D): B POS

## 2018-01-15 LAB — GLUCOSE, CAPILLARY
Glucose-Capillary: 82 mg/dL (ref 70–99)
Glucose-Capillary: 125 mg/dL — ABNORMAL HIGH (ref 70–99)
Glucose-Capillary: 237 mg/dL — ABNORMAL HIGH (ref 70–99)
Glucose-Capillary: 249 mg/dL — ABNORMAL HIGH (ref 70–99)

## 2018-01-15 MED ORDER — SODIUM CHLORIDE 0.9% IV SOLUTION
Freq: Once | INTRAVENOUS | Status: AC
  Administered 2018-01-15: 09:00:00 via INTRAVENOUS

## 2018-01-15 MED ORDER — LORAZEPAM 2 MG/ML IJ SOLN
1.0000 mg | Freq: Once | INTRAMUSCULAR | Status: AC
  Administered 2018-01-15: 1 mg via INTRAVENOUS
  Filled 2018-01-15: qty 1

## 2018-01-15 NOTE — Progress Notes (Signed)
Pt. Refusing to wear CPAP again this shift.  Requested patient wear and educated on importance.  Advised pt. To call if she changes her mind.

## 2018-01-15 NOTE — Progress Notes (Signed)
   01/15/18 2244  What Happened  Was fall witnessed? No  Was patient injured? No  Patient found on floor  Found by Staff-comment (NT Jarrett Soho )  Stated prior activity to/from bed, chair, or stretcher (pt on [facility] next to bed )  Follow Up  MD notified [place]   Time MD notified 2230  Family notified No- patient refusal  Additional tests No  Simple treatment Other (comment) (Pt resting comfortably in bed )  Adult Fall Risk Assessment  Risk Factor Category (scoring not indicated) High fall risk per protocol (document High fall risk)  Patient's Fall Risk High Fall Risk (>13 points)  Adult Fall Risk Interventions  Required Bundle Interventions *See Row Information* High fall risk - low, moderate, and high requirements implemented  Additional Interventions Family Supervision;Use of appropriate toileting equipment (bedpan, BSC, etc.);Other (Comment) (Stay with pt when [place] )  Screening for Fall Injury Risk (To be completed on HIGH fall risk patients) - Assessing Need for Low Bed  Risk For Fall Injury- Low Bed Criteria Previous fall this admission  Will Implement Low Bed and Floor Mats No - Criteria no longer met for low bed (Pt is not impulsive )  Screening for Fall Injury Risk (To be completed on HIGH fall risk patients who do not meet crieteria for Low Bed) - Assessing Need for Floor Mats Only  Risk For Fall Injury- Criteria for Floor Mats None identified - No additional interventions needed  Vitals  BP (!) 149/83  MAP (mmHg) 103  BP Method Automatic  Pulse Rate 80  Pulse Rate Source Monitor  Oxygen Therapy  SpO2 95 %  O2 Device Nasal Cannula  O2 Flow Rate (L/min) 1 L/min  Pain Assessment  Pain Scale 0-10  Pain Score 0  PCA/Epidural/Spinal Assessment  Respiratory Pattern Dyspnea with exertion  Musculoskeletal  Musculoskeletal (WDL) X  Assistive Device BSC;Front wheel walker  Generalized Weakness Yes  Weight Bearing Restrictions No  Integumentary  Integumentary (WDL) X   Skin Color Appropriate for ethnicity  Skin Condition Dry  Skin Integrity Eczema;Excoriated (scratch marks)  Eczema Location Other (Comment) (generalized )  Eczema Location Orientation Right;Left  Pain Screening  Clinical Progression Not changed

## 2018-01-15 NOTE — Progress Notes (Signed)
Pt. Refused cpap at this time.

## 2018-01-15 NOTE — Evaluation (Signed)
Physical Therapy Evaluation Patient Details Name: Tamara Silva MRN: [id number] DOB: [birth year] Today's Date: 01/15/2018   History of Present Illness  Tamara Silva is a 57 y.o. female with medical history significant of coronary artery disease, chronic systolic heart failure, essential hypertension, diabetes, hyperlipidemia admitted with acute on chronic CHF.  Clinical Impression  Patient presents with decreased mobility due to weakness, back pain and immobility.  She currently needs min A to S level assist and gradually cooperated more with support from family and staff and was able to walk in hallway.  She will benefit from skilled PT in the acute setting to allow return home with family support and follow up HHPT.      Follow Up Recommendations Home health PT    Equipment Recommendations  Rolling walker with 5" wheels    Recommendations for Other Services       Precautions / Restrictions Precautions Precautions: Fall Precaution Comments: oxygen dependent      Mobility  Bed Mobility Overal bed mobility: Needs Assistance Bed Mobility: Supine to Sit     Supine to sit: Min guard;HOB elevated        Transfers Overall transfer level: Needs assistance Equipment used: Rolling walker (2 wheeled) Transfers: Sit to/from Stand Sit to Stand: Supervision         General transfer comment: cues and encouragement initially to participate  Ambulation/Gait Ambulation/Gait assistance: Supervision;Min guard;+2 safety/equipment(chair following) Gait Distance (Feet): 40 Feet(8', then 25', then 40') Assistive device: Rolling walker (2 wheeled) Gait Pattern/deviations: Step-to pattern;Step-through pattern;Decreased stride length;Trunk flexed     General Gait Details: several stops to rest due to back pain, maintained on 4-6L O2 throughout and SpO2 92% and above  Stairs            Wheelchair Mobility    Modified Rankin (Stroke Patients Only)       Balance Overall  balance assessment: Needs assistance   Sitting balance-Leahy Scale: Good     Standing balance support: Bilateral upper extremity supported Standing balance-Leahy Scale: Poor Standing balance comment: UE support needed due to back pain                             Pertinent Vitals/Pain Pain Assessment: Faces Faces Pain Scale: Hurts whole lot Pain Location: back with ambulation Pain Descriptors / Indicators: Grimacing;Guarding Pain Intervention(s): Monitored during session;Limited activity within patient's tolerance;Repositioned    Home Living Family/patient expects to be discharged to:: Private residence Living Arrangements: Spouse/significant other(boyfriend) Available Help at Discharge: Family;Available PRN/intermittently;Friend(s) Type of Home: House Home Access: Stairs to enter   [employer] of Steps: 2 Home Layout: One level Home Equipment: Cane - single point;Bedside commode      Prior Function Level of Independence: Independent with assistive device(s)               Hand Dominance        Extremity/Trunk Assessment   Upper Extremity Assessment Upper Extremity Assessment: Overall WFL for tasks assessed    Lower Extremity Assessment Lower Extremity Assessment: Generalized weakness    Cervical / Trunk Assessment Cervical / Trunk Assessment: Kyphotic  Communication   Communication: No difficulties  Cognition Arousal/Alertness: Awake/alert Behavior During Therapy: WFL for tasks assessed/performed;Agitated Overall Cognitive Status: Within Functional Limits for tasks assessed  General Comments General comments (skin integrity, edema, etc.): Initially patient unwilling to participate, then with RN and daughter support pt willing and encouraged by participation.     Exercises     Assessment/Plan    PT Assessment Patient needs continued PT services  PT Problem List Decreased  activity tolerance;Decreased balance;Decreased strength;Decreased mobility;Cardiopulmonary status limiting activity       PT Treatment Interventions DME instruction;Therapeutic activities;Gait training;Therapeutic exercise;Patient/family education;Balance training;Stair training;Functional mobility training    PT Goals (Current goals can be found in the Care Plan section)  Acute Rehab PT Goals Patient Stated Goal: to walk PT Goal Formulation: With patient/family Time For Goal Achievement: 01/29/18 Potential to Achieve Goals: Good    Frequency Min 3X/week   Barriers to discharge        Co-evaluation               AM-PAC PT "6 Clicks" Daily Activity  Outcome Measure Difficulty turning over in bed (including adjusting bedclothes, sheets and blankets)?: A Little Difficulty moving from lying on back to sitting on the side of the bed? : A Little Difficulty sitting down on and standing up from a chair with arms (e.g., wheelchair, bedside commode, etc,.)?: A Little Help needed moving to and from a bed to chair (including a wheelchair)?: A Little Help needed walking in hospital room?: A Little Help needed climbing 3-5 steps with a railing? : A Lot 6 Click Score: 17    End of Session Equipment Utilized During Treatment: Oxygen Activity Tolerance: Patient limited by fatigue Patient left: in chair;with call bell/phone within reach;with family/visitor present   PT Visit Diagnosis: Other abnormalities of gait and mobility (R26.89);Difficulty in walking, not elsewhere classified (R26.2)    Time: 1318-1345(1430-1456) PT Time Calculation (min) (ACUTE ONLY): 27 min   Charges:   PT Evaluation $PT Eval Moderate Complexity: 1 Mod PT Treatments $Gait Training: 23-37 mins $Therapeutic Activity: 8-22 mins        [place], [place] [phone] 01/15/2018   Reginia Naas 01/15/2018, 3:32 PM

## 2018-01-15 NOTE — Progress Notes (Addendum)
PROGRESS NOTE    Tamara Silva  CXK:[id number] DOB: June 19, 1960 DOA: 01/13/2018 PCP: Tresa Garter, MD     Brief Narrative:  Tamara Silva is a 57 y.o. female with medical history significant of coronary artery disease, chronic systolic heart failure, essential hypertension, diabetes, hyperlipidemia who presents when her oxygen battery ran out.  She called EMS and when they could not replace her battery, she asked to be transferred to the emergency department.  In the emergency department, she asked that her labs be checked as she was on her way to see her pulmonologist anyway.  She did not have any acute complaints.  She states that she has been more short of breath over the past week to month and was going to address this with her pulmonologist at her routine follow-up appointment today. She normally wears 2 L of nasal cannula O2 at home, and had to bump it up to 2.5 L for the last month.  She denies any fevers, chills, chest pain, nausea, vomiting, abdominal pain, diarrhea.  She has had chronic constipation. She remained stable in the emergency department.  Labs were obtained at patient's request.  Her potassium was elevated 5.6, creatinine 1.62 from her baseline of 0.8, BNP 795.  Chest x-ray revealed mildly progressive changes of congestive heart failure with interval mild bilateral alveolar edema.  Patient was admitted for acute on chronic combined systolic and diastolic heart failure.  New events last 24 hours / Subjective: No acute events or complaints today. Had fever 100.7 overnight. No complaints of chest pain, worsening shortness of breath this morning. No BM reported.   Assessment & Plan:   Principal Problem:   Acute on chronic combined systolic (congestive) and diastolic (congestive) heart failure (HCC)   Acute on chronic combined systolic and diastolic heart failure  -Previous echocardiogram in April 2018 showed EF 35 to 97%, grade 2 diastolic dysfunction -Repeat  echocardiogram 01/14/18 showed EF 35 to 02%, grade 2 diastolic dysfunction, no significant change from previous study  -Chest x-ray revealed bilateral alveolar edema, patient also presents with bilateral pitting edema of lower extremities, BNP 795 -She states that she has been taking Lasix 80 mg twice daily (was actually prescribed 40 mg as needed daily) without improvement in her lower extremity edema -Continue IV Lasix 80 mg twice daily -Strict I's and O's, daily weight, fluid restriction  -Continue coreg  -UOP -2.5L and weight -6lb since admission  Chronic hypoxemic respiratory failure -Uses 2 L nasal cannula at baseline -O2 goal 88-92%  Acute kidney injury  -Baseline creatinine 0.8-0.9  -Monitor creatinine closely while on IV Lasix -Hold cozaar due to AKI  -Improving   Mild hyperkalemia -Resolved with IV lasix   CAD -Continue aspirin, effient   Chronic microcytic anemia, iron deficiency anemia -Baseline hemoglobin 8-9  -IV feraheme 8/17 -No report of any overt blood loss, will give 1u pRBC today in setting of cardiac disease   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 -Lantus 45u qhs, SSI   HTN -Continue coreg -Hold cozaar due to AKI and hyperkalemia  -Hold spironolactone due to hyperkalemia   HLD -Continue crestor   OSA -CPAP qhs   Constipation -Bowel regimen ordered   DVT prophylaxis: Subq hep Code Status: Full Family Communication: No family at bedside Disposition Plan: Pending improvement, PT OT eval    Consultants:   None  Procedures:   None   Antimicrobials:  Anti-infectives (From admission, onward)   None       Objective: Vitals:  01/14/18 1359 01/14/18 2126 01/15/18 0550 01/15/18 0731  BP: (!) 112/54 119/61 (!) 106/47   Pulse: 69 72 70   Resp: (!) 28 (!) 22 20   Temp: 99.3 F (37.4 C) 100 F (37.8 C) (!) 100.7 F (38.2 C)   TempSrc: Oral Oral Oral   SpO2: 100% 100% 99% 98%  Weight:   (!)  144.1 kg   Height:        Intake/Output Summary (Last 24 hours) at 01/15/2018 0949 Last data filed at 01/14/2018 2129 Gross per 24 hour  Intake 395.41 ml  Output 1200 ml  Net -804.59 ml   Filed Weights   01/13/18 1955 01/14/18 0403 01/15/18 0550  Weight: (!) 146.9 kg (!) 145.2 kg (!) 144.1 kg      Examination: General exam: Appears calm and comfortable  Respiratory system: Diminished breath sounds bilaterally,  Cardiovascular system: S1 & S2 heard, RRR. No JVD, murmurs, rubs, gallops or clicks. +nonpitting edema bilaterally  Gastrointestinal system: Abdomen is nondistended, soft and nontender. No organomegaly or masses felt. Normal bowel sounds heard. Central nervous system: Alert and oriented. No focal neurological deficits. Extremities: Symmetric  Skin: No rashes, lesions or ulcers Psychiatry: Judgement and insight appear normal. Mood & affect appropriate.    Data Reviewed: I have personally reviewed following labs and imaging studies  CBC: Recent Labs  Lab 01/13/18 1529 01/14/18 0412 01/15/18 0405  WBC 6.4 7.6 7.7  NEUTROABS 3.8  --   --   HGB 7.9* 8.2* 7.2*  HCT 31.4* 32.7* 28.5*  MCV 69.5* 70.0* 71.1*  PLT 206 255 902*   Basic Metabolic Panel: Recent Labs  Lab 01/13/18 1529 01/14/18 0412 01/15/18 0405  NA 137 136 137  K 5.6* 5.6* 4.5  CL 103 100 100  CO2 28 29 31   GLUCOSE 342* 218* 82  BUN 29* 30* 27*  CREATININE 1.62* 1.43* 1.22*  CALCIUM 8.4* 8.5* 8.5*   GFR: Estimated Creatinine Clearance: 75.1 mL/min (A) (by C-G formula based on SCr of 1.22 mg/dL (H)). Liver Function Tests: No results for input(s): AST, ALT, ALKPHOS, BILITOT, PROT, ALBUMIN in the last 168 hours. No results for input(s): LIPASE, AMYLASE in the last 168 hours. No results for input(s): AMMONIA in the last 168 hours. Coagulation Profile: No results for input(s): INR, PROTIME in the last 168 hours. Cardiac Enzymes: No results for input(s): CKTOTAL, CKMB, CKMBINDEX, TROPONINI in  the last 168 hours. BNP (last 3 results) No results for input(s): PROBNP in the last 8760 hours. HbA1C: No results for input(s): HGBA1C in the last 72 hours. CBG: Recent Labs  Lab 01/14/18 0741 01/14/18 1157 01/14/18 1728 01/14/18 2315 01/15/18 0750  GLUCAP 190* 228* 265* 120* 82   Lipid Profile: No results for input(s): CHOL, HDL, LDLCALC, TRIG, CHOLHDL, LDLDIRECT in the last 72 hours. Thyroid Function Tests: No results for input(s): TSH, T4TOTAL, FREET4, T3FREE, THYROIDAB in the last 72 hours. Anemia Panel: Recent Labs    01/14/18 0412  FERRITIN 6*  TIBC 524*  IRON 27*   Sepsis Labs: No results for input(s): PROCALCITON, LATICACIDVEN in the last 168 hours.  No results found for this or any previous visit (from the past 240 hour(s)).     Radiology Studies: Dg Chest 2 View  Result Date: 01/13/2018 CLINICAL DATA:  Shortness of breath. EXAM: CHEST - 2 VIEW COMPARISON:  09/12/2017. FINDINGS: Stable enlarged cardiac silhouette and diffuse prominence of the pulmonary vasculature and interstitial markings. Increased mild patchy opacity throughout both lungs. Minimal bilateral pleural fluid.  Unremarkable bones. IMPRESSION: Mildly progressive changes of congestive heart failure with interval mild bilateral alveolar edema. Electronically Signed   By: Claudie Revering M.D.   On: 01/13/2018 17:06      Scheduled Meds: . aspirin EC  81 mg Oral Daily  . carvedilol  12.5 mg Oral BID WC  . fluticasone  2 spray Each Nare Daily  . furosemide  80 mg Intravenous BID  . heparin  5,000 Units Subcutaneous Q8H  . insulin aspart  0-15 Units Subcutaneous TID WC  . insulin aspart  0-5 Units Subcutaneous QHS  . insulin glargine  45 Units Subcutaneous QHS  . pantoprazole  80 mg Oral Daily  . polyethylene glycol  17 g Oral Daily  . prasugrel  10 mg Oral Daily  . rosuvastatin  10 mg Oral Daily  . senna  1 tablet Oral BID  . sodium chloride flush  3 mL Intravenous Q12H  . sodium chloride flush   3 mL Intravenous Q12H  . umeclidinium-vilanterol  1 puff Inhalation Daily   Continuous Infusions: . sodium chloride 250 mL (01/14/18 0545)     LOS: 2 days    Time spent: 25 minutes   Dessa Phi, DO [facility] [email or web address] Password [facility] 01/15/2018, 9:49 AM

## 2018-01-16 LAB — BASIC METABOLIC PANEL
Anion gap: 8 (ref 5–15)
BUN: 28 mg/dL — AB (ref 6–20)
CALCIUM: 8.7 mg/dL — AB (ref 8.9–10.3)
CO2: 34 mmol/L — ABNORMAL HIGH (ref 22–32)
CREATININE: 1.21 mg/dL — AB (ref 0.44–1.00)
Chloride: 97 mmol/L — ABNORMAL LOW (ref 98–111)
GFR calc Af Amer: 56 mL/min — ABNORMAL LOW (ref >60)
GFR, EST NON AFRICAN AMERICAN: 48 mL/min — AB (ref >60)
GLUCOSE: 161 mg/dL — AB (ref 70–99)
Potassium: 4.7 mmol/L (ref 3.5–5.1)
Sodium: 139 mmol/L (ref 135–145)

## 2018-01-16 LAB — CBC
HCT: 32.4 % — ABNORMAL LOW (ref 36.0–46.0)
Hemoglobin: 8.2 g/dL — ABNORMAL LOW (ref 12.0–15.0)
MCH: 18.2 pg — AB (ref 26.0–34.0)
MCHC: 25.3 g/dL — AB (ref 30.0–36.0)
MCV: 71.8 fL — AB (ref 78.0–100.0)
Platelets: 178 10*3/uL (ref 150–400)
RBC: 4.51 MIL/uL (ref 3.87–5.11)
RDW: 25 % — AB (ref 11.5–15.5)
WBC: 9.3 10*3/uL (ref 4.0–10.5)

## 2018-01-16 LAB — BPAM RBC
BLOOD PRODUCT EXPIRATION DATE: 201909192359
ISSUE DATE / TIME: 201908181824
Unit Type and Rh: 7300

## 2018-01-16 LAB — BLOOD GAS, ARTERIAL
Acid-Base Excess: 10.2 mmol/L — ABNORMAL HIGH (ref 0.0–2.0)
BICARBONATE: 36.2 mmol/L — AB (ref 20.0–28.0)
Drawn by: 331471
O2 Content: 2 L/min
O2 Saturation: 90.7 %
PATIENT TEMPERATURE: 98.6
PH ART: 7.38 (ref 7.350–7.450)
PO2 ART: 62.2 mmHg — AB (ref 83.0–108.0)
pCO2 arterial: 62.6 mmHg — ABNORMAL HIGH (ref 32.0–48.0)

## 2018-01-16 LAB — TYPE AND SCREEN
ABO/RH(D): B POS
Antibody Screen: NEGATIVE
Unit division: 0

## 2018-01-16 LAB — GLUCOSE, CAPILLARY
GLUCOSE-CAPILLARY: 115 mg/dL — AB (ref 70–99)
Glucose-Capillary: 131 mg/dL — ABNORMAL HIGH (ref 70–99)
Glucose-Capillary: 141 mg/dL — ABNORMAL HIGH (ref 70–99)
Glucose-Capillary: 227 mg/dL — ABNORMAL HIGH (ref 70–99)
Glucose-Capillary: 98 mg/dL (ref 70–99)

## 2018-01-16 NOTE — Care Management Note (Signed)
Case Management Note  Patient Details  Name: Amandalynn Pitz MRN: [id number] Date of Birth: June 21, 1960  Subjective/Objective:                  discharge  Action/Plan: Daughter mobile number is [phone]  Expected Discharge Date:  (unknown)               Expected Discharge Plan:  [place]  In-House Referral:     Discharge planning Services  CM Consult  Post Acute Care Choice:  Durable Medical Equipment, Home Health Choice offered to:  Patient  DME Arranged:    DME Agency:  [facility]  HH Arranged:  RN, PT, OT, Nurse's Aide, Social Work [employer] Agency:  [facility] [employer]  Status of Service:  Completed, signed off  If discussed at [employer], dates discussed:    Additional Comments:  Leeroy Cha, RN 01/16/2018, 2:28 PM

## 2018-01-16 NOTE — Evaluation (Addendum)
Occupational Therapy Evaluation Patient Details Name: Tamara Silva MRN: [id number] DOB: 10/21/1960 Today's Date: 01/16/2018    History of Present Illness Tamara Silva is a 57 y.o. female with medical history significant of coronary artery disease, chronic systolic heart failure, essential hypertension, diabetes, hyperlipidemia admitted with acute on chronic CHF.   Clinical Impression   Met with pt in and out of sleep at time of evaluation. Noted in chart instance of fall late last date. Pt needing consistent encouragement from therapy to participate in session this date. Pt on 2L O2 upon entry, requiring up to 6L O2 to complete functional tasks. Pt self aware of needing more oxygen and knowledgeable when needing more support. Encouraged engagement in BADL activity during session to increase independence when returning home, pt adamantly deferring 2/2 to mouth pain and stating she is waiting for the NT to complete bathing with her. Pt receives bed level baths from boyfriend at baseline. Pt also actively using BSC at baseline. Cardiopulmonary status/activity tolerance severely limiting in activity, needing 6L of O2 to complete functional mobility into the hallway with 2-3 rest breaks. Pt will benefit from [facility] to address BADL independence and activity tolerance in the home.    Follow Up Recommendations  Home health OT    Equipment Recommendations  None recommended by OT    Recommendations for Other Services       Precautions / Restrictions Precautions Precautions: Fall Precaution Comments: oxygen dependent Restrictions Weight Bearing Restrictions: No      Mobility Bed Mobility Overal bed mobility: Needs Assistance Bed Mobility: Sit to Supine     Supine to sit: Min assist     General bed mobility comments: HHA to pull to sit  Transfers Overall transfer level: Needs assistance Equipment used: Rolling walker (2 wheeled) Transfers: Sit to/from Stand Sit to Stand: Min  assist         General transfer comment: need A for sit<>stand    Balance Overall balance assessment: Needs assistance   Sitting balance-Leahy Scale: Good     Standing balance support: Bilateral upper extremity supported Standing balance-Leahy Scale: Poor Standing balance comment: UE support needed due to back pain                           ADL either performed or assessed with clinical judgement   ADL Overall ADL's : Needs assistance/impaired Eating/Feeding: Set up;Sitting Eating/Feeding Details (indicate cue type and reason): to finish tray while seated in recliner Grooming: Minimal assistance;Sitting   Upper Body Bathing: Maximal assistance;Total assistance;Bed level Upper Body Bathing Details (indicate cue type and reason): pt recieves bed level bathing at baseline, prefers this baseline Lower Body Bathing: Maximal assistance;Total assistance;Bed level Lower Body Bathing Details (indicate cue type and reason): pt recieves bed level bathing at baseline, prefers this baseline Upper Body Dressing : Set up;Sitting   Lower Body Dressing: Moderate assistance;Sitting/lateral leans   Toilet Transfer: Minimal assistance;BSC   Toileting- Clothing Manipulation and Hygiene: Minimal assistance;Moderate assistance;Sitting/lateral lean     Tub/Shower Transfer Details (indicate cue type and reason): n/a 2/2 pt baseline   General ADL Comments: pt remotely self limiting at baseline, reports laying on bed to recieve bed level baths from boyfriend. O2 dependency limits activity tolerance to engage in functional activity     Vision Baseline Vision/History: No visual deficits Patient Visual Report: No change from baseline       Perception     Praxis      Pertinent  Vitals/Pain Pain Assessment: Faces Faces Pain Scale: Hurts whole lot Pain Location: back pain with functional mobility Pain Descriptors / Indicators: Grimacing;Guarding Pain Intervention(s): Limited  activity within patient's tolerance;Monitored during session;Repositioned     Hand Dominance     Extremity/Trunk Assessment Upper Extremity Assessment Upper Extremity Assessment: Overall WFL for tasks assessed   Lower Extremity Assessment Lower Extremity Assessment: Generalized weakness       Communication Communication Communication: No difficulties   Cognition Arousal/Alertness: Awake/alert Behavior During Therapy: WFL for tasks assessed/performed Overall Cognitive Status: Within Functional Limits for tasks assessed                                     General Comments       Exercises     Shoulder Instructions      Home Living Family/patient expects to be discharged to:: Private residence Living Arrangements: Spouse/significant other(boyfriend) Available Help at Discharge: Family;Available PRN/intermittently;Friend(s) Type of Home: House Home Access: Stairs to enter [employer] of Steps: 2   Home Layout: One level     Bathroom Shower/Tub: Teacher, early years/pre: Handicapped height     Home Equipment: Cane - single point;Bedside commode          Prior Functioning/Environment Level of Independence: Needs assistance    ADL's / Homemaking Assistance Needed: pt with bed level bathing at baseline, assist with dressing, assist with all IADLs            OT Problem List: Decreased strength;Decreased activity tolerance;Cardiopulmonary status limiting activity      OT Treatment/Interventions: Self-care/ADL training;Therapeutic exercise;Therapeutic activities;Energy conservation;Patient/family education    OT Goals(Current goals can be found in the care plan section) Acute Rehab OT Goals Patient Stated Goal: to walk with therapy OT Goal Formulation: With patient Time For Goal Achievement: 01/30/18 Potential to Achieve Goals: Good  OT Frequency: Min 2X/week   Barriers to D/C:            Co-evaluation PT/OT/SLP  Co-Evaluation/Treatment: Yes Reason for Co-Treatment: For patient/therapist safety;Necessary to address cognition/behavior during functional activity PT goals addressed during session: Mobility/safety with mobility;Proper use of DME;Strengthening/ROM OT goals addressed during session: ADL's and self-care;Strengthening/ROM      AM-PAC PT "6 Clicks" Daily Activity     Outcome Measure Help from another person eating meals?: A Little Help from another person taking care of personal grooming?: A Little Help from another person toileting, which includes using toliet, bedpan, or urinal?: A Lot Help from another person bathing (including washing, rinsing, drying)?: Total Help from another person to put on and taking off regular upper body clothing?: A Little Help from another person to put on and taking off regular lower body clothing?: A Lot 6 Click Score: 14   End of Session Equipment Utilized During Treatment: Gait belt;Rolling walker;Oxygen Nurse Communication: Mobility status  Activity Tolerance: Patient tolerated treatment well Patient left: in chair;with call bell/phone within reach;with nursing/sitter in room  OT Visit Diagnosis: Muscle weakness (generalized) (M62.81);Other abnormalities of gait and mobility (R26.89)                Time: 1510-1520 OT Time Calculation (min): 10 min Charges:  OT General Charges $OT Visit: 1 Visit OT Evaluation $OT Eval Moderate Complexity: [place], MSOT, OTR/L  [place] 01/16/2018, 5:39 PM

## 2018-01-16 NOTE — Progress Notes (Signed)
Physical Therapy Treatment Patient Details Name: Tamara Silva MRN: [id number] DOB: 03-Sep-1960 Today's Date: 01/16/2018    History of Present Illness Tamara Silva is a 57 y.o. female with medical history significant of coronary artery disease, chronic systolic heart failure, essential hypertension, diabetes, hyperlipidemia admitted with acute on chronic CHF.    PT Comments    Patient again initially reluctant to participate due to wanting to sleep (had been asleep most of today.)  But easy to persuade, just takes increased time.  Seen with OT for participation and safety.  Feel she walked less and needed more assist today and was hypoxic on 4L O2 had to increase to 6L for ambulation.  But was happy she participated in that she had fall late yesterday.  Will continue skilled PT prior to d/c home with HHPT.    Follow Up Recommendations  Home health PT;Supervision/Assistance - 24 hour     Equipment Recommendations  Rolling walker with 5" wheels    Recommendations for Other Services       Precautions / Restrictions Precautions Precautions: Fall Precaution Comments: oxygen dependent    Mobility  Bed Mobility Overal bed mobility: Needs Assistance Bed Mobility: Sit to Supine     Supine to sit: Min assist     General bed mobility comments: pulled up on therapists hand  Transfers Overall transfer level: Needs assistance Equipment used: Rolling walker (2 wheeled) Transfers: Sit to/from Stand Sit to Stand: Min assist         General transfer comment: assist from bed, S from recliner with armrests  Ambulation/Gait Ambulation/Gait assistance: Min guard;+2 safety/equipment(for chair follow) Gait Distance (Feet): 10 Feet(8', then 22', then 20') Assistive device: Rolling walker (2 wheeled) Gait Pattern/deviations: Step-to pattern;Step-through pattern;Trunk flexed;Shuffle     General Gait Details: SpO2 dipped to 86% on 4L O2 so increased to 6L portable O2 for  ambulation   Stairs             Wheelchair Mobility    Modified Rankin (Stroke Patients Only)       Balance Overall balance assessment: Needs assistance   Sitting balance-Leahy Scale: Good     Standing balance support: Bilateral upper extremity supported Standing balance-Leahy Scale: Poor Standing balance comment: UE support needed due to back pain                            Cognition Arousal/Alertness: Awake/alert Behavior During Therapy: WFL for tasks assessed/performed Overall Cognitive Status: Within Functional Limits for tasks assessed                                        Exercises      General Comments        Pertinent Vitals/Pain Faces Pain Scale: Hurts whole lot Pain Location: back with ambulation Pain Descriptors / Indicators: Grimacing;Guarding Pain Intervention(s): Monitored during session;Repositioned;Limited activity within patient's tolerance    Home Living                      Prior Function            PT Goals (current goals can now be found in the care plan section) Progress towards PT goals: Not progressing toward goals - comment(less distance and increased A for transfers)    Frequency    Min 3X/week      PT Plan  Current plan remains appropriate    Co-evaluation PT/OT/SLP Co-Evaluation/Treatment: Yes Reason for Co-Treatment: For patient/therapist safety;Necessary to address cognition/behavior during functional activity PT goals addressed during session: Mobility/safety with mobility;Proper use of DME;Strengthening/ROM        AM-PAC PT "6 Clicks" Daily Activity  Outcome Measure  Difficulty turning over in bed (including adjusting bedclothes, sheets and blankets)?: A Little Difficulty moving from lying on back to sitting on the side of the bed? : Unable Difficulty sitting down on and standing up from a chair with arms (e.g., wheelchair, bedside commode, etc,.)?: Unable Help needed  moving to and from a bed to chair (including a wheelchair)?: A Little Help needed walking in hospital room?: A Little Help needed climbing 3-5 steps with a railing? : A Lot 6 Click Score: 13    End of Session Equipment Utilized During Treatment: Gait belt;Oxygen Activity Tolerance: Patient limited by fatigue Patient left: in chair;with nursing/sitter in room   PT Visit Diagnosis: Other abnormalities of gait and mobility (R26.89);Difficulty in walking, not elsewhere classified (R26.2)     Time: 0156-1537 PT Time Calculation (min) (ACUTE ONLY): 42 min  Charges:  $Gait Training: 8-22 mins                     [place], [place] 01/16/2018    Reginia Naas 01/16/2018, 4:33 PM

## 2018-01-16 NOTE — Progress Notes (Signed)
PROGRESS NOTE    Tamara Silva  ZOX:[id number] DOB: [birth year] DOA: 01/13/2018 PCP: Tresa Garter, MD     Brief Narrative:  Tamara Silva is a 57 y.o. female with medical history significant of coronary artery disease, chronic systolic heart failure, essential hypertension, diabetes, hyperlipidemia who presents when her oxygen battery ran out.  She called EMS and when they could not replace her battery, she asked to be transferred to the emergency department.  In the emergency department, she asked that her labs be checked as she was on her way to see her pulmonologist anyway.  She did not have any acute complaints.  She states that she has been more short of breath over the past week to month and was going to address this with her pulmonologist at her routine follow-up appointment today. She normally wears 2 L of nasal cannula O2 at home, and had to bump it up to 2.5 L for the last month.  She denies any fevers, chills, chest pain, nausea, vomiting, abdominal pain, diarrhea.  She has had chronic constipation. She remained stable in the emergency department.  Labs were obtained at patient's request.  Her potassium was elevated 5.6, creatinine 1.62 from her baseline of 0.8, BNP 795.  Chest x-ray revealed mildly progressive changes of congestive heart failure with interval mild bilateral alveolar edema.  Patient was admitted for acute on chronic combined systolic and diastolic heart failure.  New events last 24 hours / Subjective: States she needs to urinate. No complaints of worsening SOB. Had an unwitnessed fall in room last night.   Assessment & Plan:   Principal Problem:   Acute on chronic combined systolic (congestive) and diastolic (congestive) heart failure (HCC)   Acute on chronic combined systolic and diastolic heart failure  -Previous echocardiogram in April 2018 showed EF 35 to 81%, grade 2 diastolic dysfunction -Repeat echocardiogram 01/14/18 showed EF 35 to 40%, grade 2  diastolic dysfunction, no significant change from previous study  -Chest x-ray revealed bilateral alveolar edema, patient also presents with bilateral pitting edema of lower extremities, BNP 795 -She states that she has been taking Lasix 80 mg twice daily (was actually prescribed 40 mg as needed daily) without improvement in her lower extremity edema -Continue IV Lasix 80 mg twice daily -Strict I's and O's, daily weight, fluid restriction  -Continue coreg  -UOP -2.8L since admission. Weight accuracy today? 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 Has been refusing CPAP. Appeared more lethargic this morning, check ABG   Constipation -Bowel regimen ordered   DVT prophylaxis: Subq hep Code Status: Full Family Communication: No family at bedside Disposition Plan: Pending improvement, PT OT rec home health    Consultants:   None  Procedures:   None   Antimicrobials:  Anti-infectives (From admission, onward)   None       Objective:  Vitals:   01/15/18 2244 01/16/18 0010 01/16/18 0647 01/16/18 0830  BP: (!) 149/83 (!) 146/71 109/61   Pulse: 80 75 67   Resp:  (!) 22 (!) 22   Temp:  98.8 F (37.1 C) 99.9 F (37.7 C)   TempSrc:  Oral Oral   SpO2: 95% 95% 99% 96%  Weight:   (!) 145.7 kg   Height:         Intake/Output Summary (Last 24 hours) at 01/16/2018 0924 Last data filed at 01/16/2018 0900 Gross per 24 hour  Intake 1315 ml  Output 2000 ml  Net -685 ml   Filed Weights   01/14/18 0403 01/15/18 0550 01/16/18 0647  Weight: (!) 145.2 kg (!) 144.1 kg (!) 145.7 kg    Examination: General exam: Appears calm and comfortable, appears bit lethargic  Respiratory system: Diminished. On [place] O2. Respiratory effort normal. Cardiovascular system: S1 & S2 heard, RRR. No JVD, murmurs, rubs, gallops or clicks. +nonpitting pedal edema. Gastrointestinal system: Abdomen is nondistended, soft and nontender. No organomegaly or masses felt. Normal bowel sounds heard. Central nervous system: Alert and oriented. No focal neurological deficits. Extremities: Symmetric  Skin: No rashes, lesions or ulcers Psychiatry: Judgement and insight appear stable    Data Reviewed: I have personally reviewed following labs and imaging studies  CBC: Recent Labs  Lab 01/13/18 1529 01/14/18 0412 01/15/18 0405 01/16/18 0426  WBC 6.4 7.6 7.7 9.3  NEUTROABS 3.8  --   --   --   HGB 7.9* 8.2* 7.2* 8.2*  HCT 31.4* 32.7* 28.5* 32.4*  MCV 69.5* 70.0* 71.1* 71.8*  PLT 206 255 135* 676   Basic Metabolic Panel: Recent Labs  Lab 01/13/18 1529 01/14/18 0412 01/15/18 0405 01/16/18 0426  NA 137 136 137 139  K 5.6* 5.6* 4.5 4.7  CL 103 100 100 97*  CO2 28 29 31  34*  GLUCOSE 342* 218* 82 161*  BUN 29* 30* 27* 28*  CREATININE 1.62* 1.43* 1.22* 1.21*  CALCIUM 8.4* 8.5* 8.5* 8.7*   GFR: Estimated Creatinine Clearance: 76.2 mL/min (A) (by C-G formula based on SCr of 1.21 mg/dL (H)). Liver Function Tests: No results for input(s): AST, ALT, ALKPHOS, BILITOT, PROT, ALBUMIN in the last 168 hours. No results for input(s): LIPASE, AMYLASE in the last 168 hours. No results for input(s): AMMONIA in the last 168 hours. Coagulation Profile: No results for input(s): INR, PROTIME in the last 168 hours. Cardiac Enzymes: No  results for input(s): CKTOTAL, CKMB, CKMBINDEX, TROPONINI in the last 168 hours. BNP (last 3 results) No results for input(s): PROBNP in the last 8760 hours. HbA1C: No results for input(s): HGBA1C in the last 72 hours. CBG: Recent Labs  Lab 01/15/18 1130 01/15/18 1654 01/15/18 2140 01/16/18 0020 01/16/18 0847  GLUCAP 125* 237* 249* 227* 98   Lipid Profile: No results for input(s): CHOL, HDL, LDLCALC, TRIG, CHOLHDL, LDLDIRECT in the last 72 hours. Thyroid Function Tests: No results for input(s): TSH, T4TOTAL, FREET4, T3FREE, THYROIDAB in the last 72 hours. Anemia Panel: Recent Labs    01/14/18 0412  FERRITIN 6*  TIBC 524*  IRON 27*   Sepsis Labs: No results for input(s): PROCALCITON, LATICACIDVEN in the last 168 hours.  No results found for this or any previous visit (from the past 240 hour(s)).     Radiology Studies: No results found.    Scheduled Meds: . aspirin EC  81 mg Oral Daily  . carvedilol  12.5 mg Oral BID WC  . fluticasone  2 spray  Each Nare Daily  . furosemide  80 mg Intravenous BID  . heparin  5,000 Units Subcutaneous Q8H  . insulin aspart  0-15 Units Subcutaneous TID WC  . insulin aspart  0-5 Units Subcutaneous QHS  . insulin glargine  45 Units Subcutaneous QHS  . pantoprazole  80 mg Oral Daily  . polyethylene glycol  17 g Oral Daily  . prasugrel  10 mg Oral Daily  . rosuvastatin  10 mg Oral Daily  . senna  1 tablet Oral BID  . sodium chloride flush  3 mL Intravenous Q12H  . sodium chloride flush  3 mL Intravenous Q12H  . umeclidinium-vilanterol  1 puff Inhalation Daily   Continuous Infusions: . sodium chloride Stopped (01/15/18 2149)     LOS: 3 days    Time spent: 25 minutes   Dessa Phi, DO [facility] [email or web address] Password [facility] 01/16/2018, 9:24 AM

## 2018-01-17 LAB — CBC
HCT: 30.6 % — ABNORMAL LOW (ref 36.0–46.0)
Hemoglobin: 8 g/dL — ABNORMAL LOW (ref 12.0–15.0)
MCH: 19 pg — ABNORMAL LOW (ref 26.0–34.0)
MCHC: 26.1 g/dL — AB (ref 30.0–36.0)
MCV: 72.7 fL — ABNORMAL LOW (ref 78.0–100.0)
Platelets: 301 10*3/uL (ref 150–400)
RBC: 4.21 MIL/uL (ref 3.87–5.11)
RDW: 26.3 % — AB (ref 11.5–15.5)
WBC: 9.2 10*3/uL (ref 4.0–10.5)

## 2018-01-17 LAB — BASIC METABOLIC PANEL
Anion gap: 9 (ref 5–15)
BUN: 26 mg/dL — ABNORMAL HIGH (ref 6–20)
CALCIUM: 8.6 mg/dL — AB (ref 8.9–10.3)
CO2: 37 mmol/L — AB (ref 22–32)
CREATININE: 1.21 mg/dL — AB (ref 0.44–1.00)
Chloride: 96 mmol/L — ABNORMAL LOW (ref 98–111)
GFR, EST AFRICAN AMERICAN: 56 mL/min — AB (ref >60)
GFR, EST NON AFRICAN AMERICAN: 48 mL/min — AB (ref >60)
Glucose, Bld: 105 mg/dL — ABNORMAL HIGH (ref 70–99)
Potassium: 4.1 mmol/L (ref 3.5–5.1)
SODIUM: 142 mmol/L (ref 135–145)

## 2018-01-17 LAB — GLUCOSE, CAPILLARY
GLUCOSE-CAPILLARY: 176 mg/dL — AB (ref 70–99)
Glucose-Capillary: 122 mg/dL — ABNORMAL HIGH (ref 70–99)
Glucose-Capillary: 124 mg/dL — ABNORMAL HIGH (ref 70–99)
Glucose-Capillary: 164 mg/dL — ABNORMAL HIGH (ref 70–99)

## 2018-01-17 MED ORDER — ALPRAZOLAM 0.5 MG PO TABS
0.5000 mg | ORAL_TABLET | Freq: Once | ORAL | Status: AC
  Administered 2018-01-17: 0.5 mg via ORAL
  Filled 2018-01-17: qty 1

## 2018-01-17 MED ORDER — ALPRAZOLAM 1 MG PO TABS
1.0000 mg | ORAL_TABLET | Freq: Two times a day (BID) | ORAL | Status: DC | PRN
  Administered 2018-01-17: 1 mg via ORAL
  Filled 2018-01-17: qty 1

## 2018-01-17 NOTE — Progress Notes (Signed)
Only wore CPAP for a short period of time 8/19, approximately one hour then patient removed mask.

## 2018-01-17 NOTE — Progress Notes (Signed)
Patient bed alarm going off, legs hanging over rail, educated patient on importance of calling before getting up especially since she is a high fall risk. She stated "I'm not going to fall, how the hell you going to tell me what I'm trying to do." I informed her that we are able to help her get to the bedside commode safely and showed her exactly where to use the buttons to call for help, as well as wrote the phone number down where she can call me.

## 2018-01-17 NOTE — Care Management Note (Signed)
Case Management Note  Patient Details  Name: Tamara Silva MRN: [id number] Date of Birth: 06/16/1960  Subjective/Objective:  Pt admitted with Acute on Chronic combination systolic CHF                  Action/Plan:  Plan to discharge home with [facility] at home for [facility]   Expected Discharge Date:  (unknown)               Expected Discharge Plan:  [place]  In-House Referral:     Discharge planning Services  CM Consult  Post Acute Care Choice:  Durable Medical Equipment, Home Health Choice offered to:  Patient  DME Arranged:    DME Agency:  [facility]  HH Arranged:  RN, PT, OT, Nurse's Aide, Social Work [employer] Agency:  [facility] [employer]  Status of Service:  Completed, signed off  If discussed at [employer], dates discussed:    Additional CommentsPurcell Mouton, RN 01/17/2018, 12:56 PM

## 2018-01-17 NOTE — Progress Notes (Addendum)
PROGRESS NOTE    Tamara Silva  VFI:[id number] DOB: August 16, 1960 DOA: 01/13/2018 PCP: Tresa Garter, MD     Brief Narrative:  Tamara Silva is a 58 y.o. female with medical history significant of coronary artery disease, chronic systolic heart failure, essential hypertension, diabetes, hyperlipidemia who presents when her oxygen battery ran out.  She called EMS and when they could not replace her battery, she asked to be transferred to the emergency department.  In the emergency department, she asked that her labs be checked as she was on her way to see her pulmonologist anyway.  She did not have any acute complaints.  She states that she has been more short of breath over the past week to month and was going to address this with her pulmonologist at her routine follow-up appointment today. She normally wears 2 L of nasal cannula O2 at home, and had to bump it up to 2.5 L for the last month.  She denies any fevers, chills, chest pain, nausea, vomiting, abdominal pain, diarrhea.  She has had chronic constipation. She remained stable in the emergency department.  Labs were obtained at patient's request.  Her potassium was elevated 5.6, creatinine 1.62 from her baseline of 0.8, BNP 795.  Chest x-ray revealed mildly progressive changes of congestive heart failure with interval mild bilateral alveolar edema.  Patient was admitted for acute on chronic combined systolic and diastolic heart failure.  New events last 24 hours / Subjective: Sitting at bedside, eating breakfast. No new complaints today.   Assessment & Plan:   Principal Problem:   Acute on chronic combined systolic (congestive) and diastolic (congestive) heart failure (HCC)  Acute on chronic combined systolic and diastolic heart failure  -Previous echocardiogram in April 2018 showed EF 35 to 41%, grade 2 diastolic dysfunction -Repeat echocardiogram 01/14/18 showed EF 35 to 66%, grade 2 diastolic dysfunction, no significant change from  previous study  -Chest x-ray revealed bilateral alveolar edema, patient also presents with bilateral pitting edema of lower extremities, BNP 795 -She states that she has been taking Lasix 80 mg twice daily (was actually prescribed 40 mg as needed daily) without improvement in her lower extremity edema -Strict I's and O's, daily weight, fluid restriction  -Continue coreg  -UOP -2.5L. Weight accuracy today? Patient's weight back in Dec 2018 was around 270-275lb  -Continues to have bilateral LE edema, continue IV Lasix 80 mg twice daily  Chronic hypoxemic respiratory failure -Uses 2 L nasal cannula at baseline -O2 goal 88-92%  Acute kidney injury  -Baseline creatinine 0.8-0.9  -Monitor creatinine closely while on IV Lasix -Hold cozaar due to AKI  -Stable   CAD -Continue aspirin, effient   Chronic microcytic anemia, iron deficiency anemia -Baseline hemoglobin 8-9  -IV feraheme 8/17 -1u pRBC 8/18 -No report of any overt blood loss  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 -Lantus 45u qhs, SSI   HTN -Continue coreg -Hold cozaar, spironolactone due to AKI  HLD -Continue crestor   OSA -CPAP qhs. Has been refusing CPAP.  Constipation -Bowel regimen ordered   DVT prophylaxis: Subq hep Code Status: Full Family Communication: No family at bedside, spoke with daughter over the phone  Disposition Plan: Pending improvement, PT OT rec home health    Consultants:   None  Procedures:   None   Antimicrobials:  Anti-infectives (From admission, onward)   None       Objective: Vitals:   01/16/18 2039 01/17/18 0532 01/17/18 0533 01/17/18 0949  BP: (!) 121/57  125/65    Pulse: 67 72    Resp: 20 20    Temp: 99.6 F (37.6 C) 98.1 F (36.7 C)    TempSrc: Oral Oral    SpO2: (!) 88% 98%  98%  Weight:   (!) 156.4 kg   Height:        Intake/Output Summary (Last 24 hours) at 01/17/2018 1305 Last data filed at 01/17/2018 7893 Gross per 24  hour  Intake 716 ml  Output 1675 ml  Net -959 ml   Filed Weights   01/15/18 0550 01/16/18 0647 01/17/18 0533  Weight: (!) 144.1 kg (!) 145.7 kg (!) 156.4 kg    Examination: General exam: Appears calm and comfortable  Respiratory system: Clear to auscultation. Respiratory effort normal. Cardiovascular system: S1 & S2 heard, RRR. No JVD, murmurs, rubs, gallops or clicks. +bilateral nonpitting edema  Gastrointestinal system: Abdomen is nondistended, soft and nontender. No organomegaly or masses felt. Normal bowel sounds heard. Central nervous system: Alert and oriented. No focal neurological deficits. Extremities: Symmetric 5 x 5 power. Skin: No rashes, lesions or ulcers Psychiatry: Judgement and insight appear normal. Mood & affect appropriate.    Data Reviewed: I have personally reviewed following labs and imaging studies  CBC: Recent Labs  Lab 01/13/18 1529 01/14/18 0412 01/15/18 0405 01/16/18 0426 01/17/18 0527  WBC 6.4 7.6 7.7 9.3 9.2  NEUTROABS 3.8  --   --   --   --   HGB 7.9* 8.2* 7.2* 8.2* 8.0*  HCT 31.4* 32.7* 28.5* 32.4* 30.6*  MCV 69.5* 70.0* 71.1* 71.8* 72.7*  PLT 206 255 135* 178 810   Basic Metabolic Panel: Recent Labs  Lab 01/13/18 1529 01/14/18 0412 01/15/18 0405 01/16/18 0426 01/17/18 0527  NA 137 136 137 139 142  K 5.6* 5.6* 4.5 4.7 4.1  CL 103 100 100 97* 96*  CO2 28 29 31  34* 37*  GLUCOSE 342* 218* 82 161* 105*  BUN 29* 30* 27* 28* 26*  CREATININE 1.62* 1.43* 1.22* 1.21* 1.21*  CALCIUM 8.4* 8.5* 8.5* 8.7* 8.6*   GFR: Estimated Creatinine Clearance: 79.6 mL/min (A) (by C-G formula based on SCr of 1.21 mg/dL (H)). Liver Function Tests: No results for input(s): AST, ALT, ALKPHOS, BILITOT, PROT, ALBUMIN in the last 168 hours. No results for input(s): LIPASE, AMYLASE in the last 168 hours. No results for input(s): AMMONIA in the last 168 hours. Coagulation Profile: No results for input(s): INR, PROTIME in the last 168 hours. Cardiac  Enzymes: No results for input(s): CKTOTAL, CKMB, CKMBINDEX, TROPONINI in the last 168 hours. BNP (last 3 results) No results for input(s): PROBNP in the last 8760 hours. HbA1C: No results for input(s): HGBA1C in the last 72 hours. CBG: Recent Labs  Lab 01/16/18 1227 01/16/18 1743 01/16/18 2154 01/17/18 0758 01/17/18 1132  GLUCAP 141* 131* 115* 122* 124*   Lipid Profile: No results for input(s): CHOL, HDL, LDLCALC, TRIG, CHOLHDL, LDLDIRECT in the last 72 hours. Thyroid Function Tests: No results for input(s): TSH, T4TOTAL, FREET4, T3FREE, THYROIDAB in the last 72 hours. Anemia Panel: No results for input(s): VITAMINB12, FOLATE, FERRITIN, TIBC, IRON, RETICCTPCT in the last 72 hours. Sepsis Labs: No results for input(s): PROCALCITON, LATICACIDVEN in the last 168 hours.  No results found for this or any previous visit (from the past 240 hour(s)).     Radiology Studies: No results found.    Scheduled Meds: . aspirin EC  81 mg Oral Daily  . carvedilol  12.5 mg Oral BID WC  .  fluticasone  2 spray Each Nare Daily  . furosemide  80 mg Intravenous BID  . heparin  5,000 Units Subcutaneous Q8H  . insulin aspart  0-15 Units Subcutaneous TID WC  . insulin aspart  0-5 Units Subcutaneous QHS  . insulin glargine  45 Units Subcutaneous QHS  . pantoprazole  80 mg Oral Daily  . polyethylene glycol  17 g Oral Daily  . prasugrel  10 mg Oral Daily  . rosuvastatin  10 mg Oral Daily  . senna  1 tablet Oral BID  . sodium chloride flush  3 mL Intravenous Q12H  . sodium chloride flush  3 mL Intravenous Q12H  . umeclidinium-vilanterol  1 puff Inhalation Daily   Continuous Infusions: . sodium chloride Stopped (01/15/18 2149)     LOS: 4 days    Time spent: 25 minutes   Dessa Phi, DO [facility] [email or web address] Password [email or web address] 01/17/2018, 1:05 PM

## 2018-01-18 DIAGNOSIS — J9621 Acute and chronic respiratory failure with hypoxia: Secondary | ICD-10-CM

## 2018-01-18 DIAGNOSIS — J9622 Acute and chronic respiratory failure with hypercapnia: Secondary | ICD-10-CM

## 2018-01-18 DIAGNOSIS — I10 Essential (primary) hypertension: Secondary | ICD-10-CM

## 2018-01-18 DIAGNOSIS — N179 Acute kidney failure, unspecified: Secondary | ICD-10-CM

## 2018-01-18 LAB — BASIC METABOLIC PANEL
ANION GAP: 11 (ref 5–15)
BUN: 29 mg/dL — ABNORMAL HIGH (ref 6–20)
CO2: 36 mmol/L — ABNORMAL HIGH (ref 22–32)
Calcium: 8.5 mg/dL — ABNORMAL LOW (ref 8.9–10.3)
Chloride: 95 mmol/L — ABNORMAL LOW (ref 98–111)
Creatinine, Ser: 1.2 mg/dL — ABNORMAL HIGH (ref 0.44–1.00)
GFR calc Af Amer: 57 mL/min — ABNORMAL LOW (ref >60)
GFR, EST NON AFRICAN AMERICAN: 49 mL/min — AB (ref >60)
Glucose, Bld: 63 mg/dL — ABNORMAL LOW (ref 70–99)
POTASSIUM: 4.3 mmol/L (ref 3.5–5.1)
Sodium: 142 mmol/L (ref 135–145)

## 2018-01-18 LAB — CBC
HCT: 30.8 % — ABNORMAL LOW (ref 36.0–46.0)
HEMOGLOBIN: 7.9 g/dL — AB (ref 12.0–15.0)
MCH: 18.8 pg — ABNORMAL LOW (ref 26.0–34.0)
MCHC: 25.6 g/dL — ABNORMAL LOW (ref 30.0–36.0)
MCV: 73.3 fL — ABNORMAL LOW (ref 78.0–100.0)
Platelets: 351 10*3/uL (ref 150–400)
RBC: 4.2 MIL/uL (ref 3.87–5.11)
RDW: 27.2 % — ABNORMAL HIGH (ref 11.5–15.5)
WBC: 8.2 10*3/uL (ref 4.0–10.5)

## 2018-01-18 LAB — GLUCOSE, CAPILLARY
GLUCOSE-CAPILLARY: 142 mg/dL — AB (ref 70–99)
Glucose-Capillary: 122 mg/dL — ABNORMAL HIGH (ref 70–99)

## 2018-01-18 MED ORDER — SENNA 8.6 MG PO TABS: 1.0000 | ORAL_TABLET | Freq: Two times a day (BID) | ORAL | 0 refills | Status: DC

## 2018-01-18 MED ORDER — POLYETHYLENE GLYCOL 3350 17 G PO PACK: 17.0000 g | PACK | Freq: Every day | ORAL | 0 refills | Status: DC

## 2018-01-18 MED ORDER — FUROSEMIDE 40 MG PO TABS: 60.0000 mg | ORAL_TABLET | Freq: Two times a day (BID) | ORAL | 0 refills | Status: DC

## 2018-01-18 MED ORDER — SPIRONOLACTONE 25 MG PO TABS: 25.0000 mg | ORAL_TABLET | Freq: Every day | ORAL | 3 refills | Status: DC

## 2018-01-18 MED ORDER — LOSARTAN POTASSIUM 50 MG PO TABS: 50.0000 mg | ORAL_TABLET | Freq: Every day | ORAL | 1 refills | Status: DC

## 2018-01-18 MED ORDER — POLYSACCHARIDE IRON COMPLEX 150 MG PO CAPS: 150.0000 mg | ORAL_CAPSULE | Freq: Every day | ORAL | 0 refills | Status: DC

## 2018-01-18 NOTE — Progress Notes (Signed)
PT Cancellation Note  Patient Details Name: Tamara Silva MRN: [id number] DOB: 1961/01/07   Cancelled Treatment:    Reason Eval/Treat Not Completed: Other (comment); patient preparing for discharge, asked about walker for home.  Contacted RNCM to ensure walker being ordered/delivered prior to d/c.   Reginia Naas 01/18/2018, 4:17 PM Magda Kiel, [place] 01/18/2018

## 2018-01-18 NOTE — Discharge Summary (Signed)
Physician Discharge Summary  Tamara Silva  QMG:[id number]  DOB: 1960/07/28  DOA: 01/13/2018 PCP: Tresa Garter, MD  Admit date: 01/13/2018 Discharge date: 01/18/2018  Admitted From: Home  Disposition: Home   Recommendations for Outpatient Follow-up:  1. Follow up with PCP in 1 week  2. Please obtain BMP/CBC in one week to monitor renal function and Hgb   Home Health:PT/OT Equipment/Devices: O2 2LNC   Discharge Condition: Stable   CODE STATUS: Full Code   Diet recommendation: Heart Healthy / Carb Modified   Brief/Interim Summary: For full details see H&P/Progress note, but in brief, Tamara Silva is a 57 year old female with medical history significant for CAD, chronic systolic heart failure, hypertension, diabetes, hyperlipidemia who was brought by EMS to the ED after she called due to oxygen battery running out.  Upon ED evaluation her labs were checked was found to have elevated creatinine and potassium with elevated BNP, chest x-ray revealed bilateral alveolar edema and changes suggestive of congestive heart failure.  Patient also had increasing oxygen requirement from baseline.  Patient was admitted with working diagnosis of acute on chronic combined systolic and diastolic heart failure.  She was started on IV diuresis and oxygen requirement now is back to baseline.  Patient seems to be euvolemic and was deemed stable for discharge to follow-up with primary care provider.  Subjective: Patient seen and examined, she report her breathing is back to baseline.  Denies chest pain and shortness of breath.  Report lower extremity edema, however legs no significant swelling noted.  Discharge Diagnoses/Hospital Course:  Acute on chronic combined systolic and diastolic heart failure Due to noncompliant with medication and poor diet control  Echocardiogram on 01/14/2018 shows EF of 35 to 40% with grade 2 diastolic dysfunction no significant changes from previous studies. BMP was 795.   Patient was aggressively diuresed with IV Lasix grams twice daily.  UOP with net negative, however was not properly collected as patient was using the bathroom and not measuring her urine.  Weight was also not accurate as patient was refusing to be weight in multiple locations.  On exam patient seems to be euvolemic, no pitting edema, lungs are clear and oxygen requirement is at baseline on 2 L nasal cannula.  Patient will be transitioned to oral Lasix and discharged on furosemide 60 mg twice daily.  Discussed importance of low-salt diet and fluid restriction.  Advised to follow-up with PCP in 1 to 2 weeks.  Chronic hypoxemic respiratory failure On 2 L nasal cannula which is baseline Keep O2 sat above 88%  Acute kidney injury Baseline creatinine 0.8 Creatinine has plateau for the past couple of days at 1.2 likely due to Lasix Will need to check BMP in 1 week, okay to Cozaar in 2 days. Stable upon discharge   CAD Asymptomatic, continue home medication with no changes  Chronic microcytic anemia, iron deficiency anemia Baseline hemoglobin 8-9 Patient is status post 1 unit of PRBC 8/18, 1 dose of IV Feraheme 8/17 Will discharge on Niferex daily and bowel regimen  No signs of overt bleeding. 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  All other chronic medical condition were stable during the hospitalization.  Patient was seen by physical therapy, recommending home health PT On  the day of the discharge the patient's vitals were stable, and no other acute medical condition were reported by patient. the patient was felt safe to be discharge to home.   Discharge Instructions  You were cared for by a hospitalist  during your hospital stay. If you have any questions about your discharge medications or the care you received while you were in the hospital after you are discharged, you can call the unit and asked to speak with the hospitalist on call if the hospitalist that took care of you is not available. Once you are discharged, your primary care physician will handle any further medical issues. Please note that NO REFILLS for any discharge medications will be authorized once you are discharged, as it is imperative that you return to your primary care physician (or establish a relationship with a primary care physician if you do not have one) for your aftercare needs so that they can reassess your need for medications and monitor your lab values.  Discharge Instructions    (HEART FAILURE PATIENTS) Call MD:  Anytime you have any of the following symptoms: 1) 3 pound weight gain in 24 hours or 5 pounds in 1 week 2) shortness of breath, with or without a dry hacking cough 3) swelling in the hands, feet or stomach 4) if you have to sleep on extra pillows at night in order to breathe.   Complete by:  As directed    Avoid straining   Complete by:  As directed    Call MD for:  difficulty breathing, headache or visual disturbances   Complete by:  As directed    Call MD for:  extreme fatigue   Complete by:  As directed    Call MD for:  hives   Complete by:  As directed    Call MD for:  persistant dizziness or light-headedness   Complete by:  As directed    Call MD for:  persistant nausea and vomiting   Complete by:  As directed    Call MD for:  redness, tenderness, or signs of infection (pain, swelling, redness, odor or green/yellow discharge around incision site)   Complete by:  As directed    Call MD for:  severe uncontrolled pain   Complete by:  As directed    Call MD for:  temperature >100.4   Complete by:  As directed    Diet - low sodium heart healthy   Complete by:  As directed    Discharge  instructions   Complete by:  As directed    Low salt diet  Use CPAP daily  Take medications as prescribed  Follow up with primary care doctor in 1-2 weeks   Face-to-face encounter (required for Medicare/Medicaid patients)   Complete by:  As directed    The encounter with the patient was in whole, or in part, for the following medical condition, which is the primary reason for home health care:  CHF exacerbation   I certify that, based on my findings, the following services are medically necessary home health services:  Physical therapy   Reason for Medically Necessary Home Health Services:  Therapy- Therapeutic Exercises to Increase Strength and Endurance   My clinical findings support the need for the above services:  Shortness of breath with activity   Further, I certify that my clinical findings support that this patient is homebound due to:  Shortness of Breath with activity   Heart Failure patients record your daily weight using the same  scale at the same time of day   Complete by:  As directed    Home Health   Complete by:  As directed    To provide the following care/treatments:   PT [place] work     Increase activity slowly   Complete by:  As directed    STOP any activity that causes chest pain, shortness of breath, dizziness, sweating, or exessive weakness   Complete by:  As directed      Allergies as of 01/18/2018      Reactions   Metolazone Other (See Comments)   Dizziness and falling   Plavix [clopidogrel Bisulfate] Other (See Comments)   High PRU's - non-responder   Nsaids Other (See Comments)   "I do not take NSAIDs d/t interfering with other meds"      Medication List    TAKE these medications   ACCU-CHEK AVIVA device Use as instructed 3 times daily before meals.   ACCU-CHEK AVIVA PLUS test strip Generic drug:  glucose blood USE 1 STRIP TO CHECK GLUCOSE 3 TIMES DAILY   ACCU-CHEK FASTCLIX LANCETS Misc 1 applicator by Does not apply  route 4 (four) times daily -  before meals and at bedtime.   ACCU-CHEK SOFTCLIX LANCETS lancets USE 1 LANCET TO CHECK GLUCOSE 3 TIMES DAILY   ANORO ELLIPTA 62.5-25 MCG/INH Aepb Generic drug:  umeclidinium-vilanterol INHALE 1 PUFF BY MOUTH ONCE DAILY **MAKE AN APPOINTMENT WITH MD FOR MORE REFILLS**   aspirin 81 MG tablet Take 1 tablet (81 mg total) by mouth daily.   carvedilol 12.5 MG tablet Commonly known as:  COREG TAKE 1 TABLET BY MOUTH 2 TIMES A DAY WITH A MEAL   EASY TOUCH PEN NEEDLES 31G X 8 MM Misc Generic drug:  Insulin Pen Needle USE AS DIRECTED TO INJECT INSULIN   fluticasone 50 MCG/ACT nasal spray Commonly known as:  FLONASE INSTILL 2 SPRAYS IN EACH NOSTRIL TWICE DAILY.   furosemide 40 MG tablet Commonly known as:  LASIX Take 1.5 tablets (60 mg total) by mouth 2 (two) times daily. What changed:    how much to take  when to take this  reasons to take this   INSULIN SYRINGE .5CC/29G 29G X 1/2" 0.5 ML Misc Use as instructed by physician   ipratropium-albuterol 0.5-2.5 (3) MG/3ML Soln Commonly known as:  DUONEB INHALE THE CONTENTS OF 1 VIAL VIA NEBULIZER EVERY 6 HOURS AS NEEDED   iron polysaccharides 150 MG capsule Commonly known as:  NIFEREX Take 1 capsule (150 mg total) by mouth daily.   LEVEMIR 100 UNIT/ML injection Generic drug:  insulin detemir INJECT 45 UNITS SUBCUTANEOUSLY EVERY NIGHT AT BEDTIME *MUST HAVE OFFICE VISIT FOR REFILLS*   losartan 50 MG tablet Commonly known as:  COZAAR Take 1 tablet (50 mg total) by mouth daily. Start taking on:  01/20/2018 What changed:  These instructions start on 01/20/2018. If you are unsure what to do until then, ask your doctor or other care provider.   nitroGLYCERIN 0.4 MG SL tablet Commonly known as:  NITROSTAT Place 1 tablet (0.4 mg total) under the tongue every 5 (five) minutes as needed for chest pain.   NOVOLOG FLEXPEN 100 UNIT/ML FlexPen Generic drug:  insulin aspart INJECT 15-20 UNITS SUBCUTANEOUSLY 3  TIMES A DAY WITH MEALS.   omeprazole 40 MG capsule Commonly known as:  PRILOSEC TAKE 1 CAPSULE BY MOUTH TWICE DAILY.   polyethylene glycol packet Commonly known as:  MIRALAX / GLYCOLAX Take 17 g by mouth  daily. Start taking on:  01/19/2018   prasugrel 10 MG Tabs tablet Commonly known as:  EFFIENT Take 1 tablet (10 mg total) by mouth daily.   prazosin 5 MG capsule Commonly known as:  MINIPRESS TAKE 1 CAPSULE BY MOUTH EVERY NIGHT AT BEDTIME FOR NIGHTMARES.   PROAIR HFA 108 (90 Base) MCG/ACT inhaler Generic drug:  albuterol INHALE 2 PUFFS BY MOUTH 4 TIMES DAILY AS NEEDED   rosuvastatin 10 MG tablet Commonly known as:  CRESTOR TAKE ONE TABLET BY MOUTH DAILY.   senna 8.6 MG Tabs tablet Commonly known as:  SENOKOT Take 1 tablet (8.6 mg total) by mouth 2 (two) times daily.   spironolactone 25 MG tablet Commonly known as:  ALDACTONE Take 1 tablet (25 mg total) by mouth daily. Start taking on:  01/20/2018 What changed:  These instructions start on 01/20/2018. If you are unsure what to do until then, ask your doctor or other care provider.      Follow-up [place] Follow up.   Why:  Please call this number as soon as you get home.  Choose option number 1 and request that someone comes out to service your equipment. Contact information: [place] [address] [phone]        [facility] Follow up.   Specialty:  Frannie Why:  nursing, physical therapy, occupational therapy, nurses aid, social worker Contact information: [address] [place] [address] [phone]        Tresa Garter, MD. Schedule an appointment as soon as possible for a visit in 1 week(s).   Specialty:  Internal Medicine Why:  Hospital follow up.  Contact information: [place] [address] [phone]          Allergies  Allergen Reactions  . Metolazone Other (See Comments)    Dizziness  and falling  . Plavix [Clopidogrel Bisulfate] Other (See Comments)    High PRU's - non-responder  . Nsaids Other (See Comments)    "I do not take NSAIDs d/t interfering with other meds"    Consultations:   Procedures/Studies: Dg Chest 2 View  Result Date: 01/13/2018 CLINICAL DATA:  Shortness of breath. EXAM: CHEST - 2 VIEW COMPARISON:  09/12/2017. FINDINGS: Stable enlarged cardiac silhouette and diffuse prominence of the pulmonary vasculature and interstitial markings. Increased mild patchy opacity throughout both lungs. Minimal bilateral pleural fluid. Unremarkable bones. IMPRESSION: Mildly progressive changes of congestive heart failure with interval mild bilateral alveolar edema. Electronically Signed   By: Claudie Revering M.D.   On: 01/13/2018 17:06   ECHO   ------------------------------------------------------------------- Study Conclusions  - Left ventricle: The cavity size was mildly dilated. There was   mild concentric hypertrophy. Systolic function was moderately   reduced. The estimated ejection fraction was in the range of 35%   to 40%. There is akinesis of the basal and mid anteroseptal,   anterior walls and all of the apical walls. Paradoxical septal   motion. Features are consistent with a pseudonormal left   ventricular filling pattern, with concomitant abnormal relaxation   and increased filling pressure (grade 2 diastolic dysfunction).   Doppler parameters are consistent with elevated ventricular   end-diastolic filling pressure. - Aortic valve: There was no regurgitation. - Left atrium: The atrium was moderately dilated. - Right ventricle: The cavity size was moderately decreased. Wall   thickness was normal. Systolic function was moderately reduced. - Tricuspid valve: There  was mild regurgitation. - Pulmonic valve: There was no regurgitation. - Pulmonary arteries: Systolic pressure was within the normal   range.  Impressions:  - There is no significant  difference from the prior study on   09/21/2016.  Discharge Exam: Vitals:   01/18/18 0456 01/18/18 0814  BP: 120/62   Pulse: 65 75  Resp: (!) 21 18  Temp: 98.7 F (37.1 C)   SpO2: 95% 97%   Vitals:   01/17/18 2035 01/17/18 2046 01/18/18 0456 01/18/18 0814  BP:  (!) 117/55 120/62   Pulse:  66 65 75  Resp: 16 18 (!) 21 18  Temp:  100 F (37.8 C) 98.7 F (37.1 C)   TempSrc:  Oral Oral   SpO2:  100% 95% 97%  Weight:      Height:        General: Pt is alert, awake, not in acute distress Cardiovascular: RRR, S1/S2 +, no rubs, no gallops Respiratory: CTA bilaterally, no wheezing, no rhonchi Abdominal: Soft, NT, ND, bowel sounds + Extremities: no edema   The results of significant diagnostics from this hospitalization (including imaging, microbiology, ancillary and laboratory) are listed below for reference.     Microbiology: No results found for this or any previous visit (from the past 240 hour(s)).   Labs: BNP (last 3 results) Recent Labs    07/25/17 1538 01/13/18 1618  BNP 814.0* 767.3*   Basic Metabolic Panel: Recent Labs  Lab 01/14/18 0412 01/15/18 0405 01/16/18 0426 01/17/18 0527 01/18/18 0434  NA 136 137 139 142 142  K 5.6* 4.5 4.7 4.1 4.3  CL 100 100 97* 96* 95*  CO2 29 31 34* 37* 36*  GLUCOSE 218* 82 161* 105* 63*  BUN 30* 27* 28* 26* 29*  CREATININE 1.43* 1.22* 1.21* 1.21* 1.20*  CALCIUM 8.5* 8.5* 8.7* 8.6* 8.5*   Liver Function Tests: No results for input(s): AST, ALT, ALKPHOS, BILITOT, PROT, ALBUMIN in the last 168 hours. No results for input(s): LIPASE, AMYLASE in the last 168 hours. No results for input(s): AMMONIA in the last 168 hours. CBC: Recent Labs  Lab 01/13/18 1529 01/14/18 0412 01/15/18 0405 01/16/18 0426 01/17/18 0527 01/18/18 0434  WBC 6.4 7.6 7.7 9.3 9.2 8.2  NEUTROABS 3.8  --   --   --   --   --   HGB 7.9* 8.2* 7.2* 8.2* 8.0* 7.9*  HCT 31.4* 32.7* 28.5* 32.4* 30.6* 30.8*  MCV 69.5* 70.0* 71.1* 71.8* 72.7* 73.3*  PLT  206 255 135* 178 301 351   Cardiac Enzymes: No results for input(s): CKTOTAL, CKMB, CKMBINDEX, TROPONINI in the last 168 hours. BNP: Invalid input(s): POCBNP CBG: Recent Labs  Lab 01/17/18 1132 01/17/18 1645 01/17/18 2048 01/18/18 0739 01/18/18 1218  GLUCAP 124* 164* 176* 122* 142*   D-Dimer No results for input(s): DDIMER in the last 72 hours. Hgb A1c No results for input(s): HGBA1C in the last 72 hours. Lipid Profile No results for input(s): CHOL, HDL, LDLCALC, TRIG, CHOLHDL, LDLDIRECT in the last 72 hours. Thyroid function studies No results for input(s): TSH, T4TOTAL, T3FREE, THYROIDAB in the last 72 hours.  Invalid input(s): FREET3 Anemia work up No results for input(s): VITAMINB12, FOLATE, FERRITIN, TIBC, IRON, RETICCTPCT in the last 72 hours. Urinalysis    Component Value Date/Time   COLORURINE YELLOW 01/13/2018 1635   APPEARANCEUR HAZY (A) 01/13/2018 1635   LABSPEC 1.021 01/13/2018 1635   PHURINE 5.0 01/13/2018 1635   GLUCOSEU >=500 (A) 01/13/2018 1635   HGBUR NEGATIVE 01/13/2018 1635  BILIRUBINUR NEGATIVE 01/13/2018 1635   KETONESUR NEGATIVE 01/13/2018 1635   PROTEINUR 100 (A) 01/13/2018 1635   UROBILINOGEN 2.0 (H) 02/21/2015 1914   NITRITE NEGATIVE 01/13/2018 1635   LEUKOCYTESUR NEGATIVE 01/13/2018 1635   Sepsis Labs Invalid input(s): PROCALCITONIN,  WBC,  LACTICIDVEN Microbiology No results found for this or any previous visit (from the past 240 hour(s)).   Time coordinating discharge: 32 minutes  SIGNED:  Chipper Oman, MD  [facility] 01/18/2018, 12:42 PM  Pager please text page via  [email or web address]  Note - This record has been created using [employer]. Chart creation errors have been sought, but may not always have been located. Such creation errors do not reflect on the standard of medical care.

## 2018-01-18 NOTE — Progress Notes (Signed)
Pt refusing to allow this RN to get daily weight. She states "I'm too tired right now, I'll do it later." This RN educated her on the importance of getting weights daily. Will continue to monitor.

## 2018-01-18 NOTE — Progress Notes (Signed)
Patient still insistent on getting up on her own states "you won't be with me at home". Re-enforced the need to call for assistance when up and safety risks. Eulas Post, RN

## 2018-01-18 NOTE — Progress Notes (Addendum)
Occupational Therapy Treatment Patient Details Name: Tamara Silva MRN: [id number] DOB: [birth year] Today's Date: 01/18/2018    History of present illness Tamara Silva is a 57 y.o. female with medical history significant of coronary artery disease, chronic systolic heart failure, essential hypertension, diabetes, hyperlipidemia admitted with acute on chronic CHF.   OT comments  Pt pleasant during session but is somewhat self-limiting; agreeable to completion of grooming ADLs at bed level and demonstrates use of UEs to boost self up in bed. Pt completing ADLs with setup assist. Provided further education regarding importance of OOB mobility/functional task completion to maintain pt's level of functioning and pt verbalizing understanding. POC remains appropriate. Will continue to follow acutely to progress pt towards established OT goals.   Follow Up Recommendations  Home health OT    Equipment Recommendations  None recommended by OT          Precautions / Restrictions Precautions Precautions: Fall Precaution Comments: oxygen dependent Restrictions Weight Bearing Restrictions: No       Mobility Bed Mobility                  Transfers                      Balance                                           ADL either performed or assessed with clinical judgement   ADL Overall ADL's : Needs assistance/impaired Eating/Feeding: Set up;Sitting Eating/Feeding Details (indicate cue type and reason): to finish tray while seated in recliner Grooming: Set up;Bed level;Minimal assistance;Wash/dry face;Oral care   Upper Body Bathing: Maximal assistance;Total assistance;Bed level Upper Body Bathing Details (indicate cue type and reason): pt recieves bed level bathing at baseline, prefers this baseline Lower Body Bathing: Maximal assistance;Total assistance;Bed level Lower Body Bathing Details (indicate cue type and reason): pt recieves bed level  bathing at baseline, prefers this baseline Upper Body Dressing : Set up;Sitting   Lower Body Dressing: Moderate assistance;Sitting/lateral leans   Toilet Transfer: Minimal assistance;BSC   Toileting- Clothing Manipulation and Hygiene: Minimal assistance;Moderate assistance;Sitting/lateral lean     Tub/Shower Transfer Details (indicate cue type and reason): n/a 2/2 pt baseline   General ADL Comments: pt only agreeable to grooming ADLs at bed level as she has had frequent urgency to urinate and doesn't want to remove or dislodge purewick     Vision       Perception     Praxis      Cognition Arousal/Alertness: Awake/alert Behavior During Therapy: WFL for tasks assessed/performed Overall Cognitive Status: Within Functional Limits for tasks assessed                                          Exercises     Shoulder Instructions       General Comments      Pertinent Vitals/ Pain       Pain Assessment: Faces Faces Pain Scale: No hurt Pain Location: back pain with functional mobility Pain Descriptors / Indicators: Grimacing;Guarding Pain Intervention(s): Monitored during session  Home Living  Prior Functioning/Environment              Frequency  Min 2X/week        Progress Toward Goals  OT Goals(current goals can now be found in the care plan section)  Progress towards OT goals: OT to reassess next treatment  Acute Rehab OT Goals Patient Stated Goal: to walk with therapy OT Goal Formulation: With patient Time For Goal Achievement: 01/30/18 Potential to Achieve Goals: Good  Plan Discharge plan remains appropriate    Co-evaluation                 AM-PAC PT "6 Clicks" Daily Activity     Outcome Measure   Help from another person eating meals?: A Little Help from another person taking care of personal grooming?: A Little Help from another person toileting, which includes  using toliet, bedpan, or urinal?: A Lot Help from another person bathing (including washing, rinsing, drying)?: Total Help from another person to put on and taking off regular upper body clothing?: A Little Help from another person to put on and taking off regular lower body clothing?: A Lot 6 Click Score: 14    End of Session Equipment Utilized During Treatment: Oxygen  OT Visit Diagnosis: Muscle weakness (generalized) (M62.81);Other abnormalities of gait and mobility (R26.89)   Activity Tolerance Patient tolerated treatment well;Other (comment)(pt self-limiting )   Patient Left in bed;with call bell/phone within reach;with bed alarm set   Nurse Communication Mobility status        Time: 5462-7035 OT Time Calculation (min): 13 min  Charges: OT General Charges $OT Visit: 1 Visit OT Treatments $Self Care/Home Management : 8-22 mins  Lou Cal, OT Pager [phone] 01/18/2018    [name] 01/18/2018, 12:04 PM

## 2018-01-23 ENCOUNTER — Telehealth: Payer: Self-pay | Admitting: Internal Medicine

## 2018-01-23 ENCOUNTER — Other Ambulatory Visit: Payer: Self-pay

## 2018-01-23 ENCOUNTER — Ambulatory Visit: Payer: Medicaid Other | Attending: Family Medicine | Admitting: Family Medicine

## 2018-01-23 ENCOUNTER — Encounter: Payer: Self-pay | Admitting: Family Medicine

## 2018-01-23 VITALS — BP 134/80 | HR 68 | Temp 98.4°F | Resp 24 | Ht 67.0 in | Wt 324.0 lb

## 2018-01-23 DIAGNOSIS — Z823 Family history of stroke: Secondary | ICD-10-CM | POA: Diagnosis not present

## 2018-01-23 DIAGNOSIS — Z6841 Body Mass Index (BMI) 40.0 and over, adult: Secondary | ICD-10-CM | POA: Diagnosis not present

## 2018-01-23 DIAGNOSIS — E785 Hyperlipidemia, unspecified: Secondary | ICD-10-CM | POA: Insufficient documentation

## 2018-01-23 DIAGNOSIS — Z886 Allergy status to analgesic agent status: Secondary | ICD-10-CM | POA: Insufficient documentation

## 2018-01-23 DIAGNOSIS — Z87891 Personal history of nicotine dependence: Secondary | ICD-10-CM | POA: Insufficient documentation

## 2018-01-23 DIAGNOSIS — Z9981 Dependence on supplemental oxygen: Secondary | ICD-10-CM

## 2018-01-23 DIAGNOSIS — Z8249 Family history of ischemic heart disease and other diseases of the circulatory system: Secondary | ICD-10-CM | POA: Diagnosis not present

## 2018-01-23 DIAGNOSIS — D509 Iron deficiency anemia, unspecified: Secondary | ICD-10-CM | POA: Diagnosis not present

## 2018-01-23 DIAGNOSIS — E1165 Type 2 diabetes mellitus with hyperglycemia: Secondary | ICD-10-CM

## 2018-01-23 DIAGNOSIS — Z794 Long term (current) use of insulin: Secondary | ICD-10-CM

## 2018-01-23 DIAGNOSIS — I5042 Chronic combined systolic (congestive) and diastolic (congestive) heart failure: Secondary | ICD-10-CM | POA: Diagnosis present

## 2018-01-23 DIAGNOSIS — G4733 Obstructive sleep apnea (adult) (pediatric): Secondary | ICD-10-CM | POA: Insufficient documentation

## 2018-01-23 DIAGNOSIS — N182 Chronic kidney disease, stage 2 (mild): Secondary | ICD-10-CM | POA: Diagnosis not present

## 2018-01-23 DIAGNOSIS — Z7901 Long term (current) use of anticoagulants: Secondary | ICD-10-CM | POA: Insufficient documentation

## 2018-01-23 DIAGNOSIS — I2510 Atherosclerotic heart disease of native coronary artery without angina pectoris: Secondary | ICD-10-CM | POA: Diagnosis not present

## 2018-01-23 DIAGNOSIS — J449 Chronic obstructive pulmonary disease, unspecified: Secondary | ICD-10-CM

## 2018-01-23 DIAGNOSIS — I25119 Atherosclerotic heart disease of native coronary artery with unspecified angina pectoris: Secondary | ICD-10-CM

## 2018-01-23 DIAGNOSIS — IMO0002 Reserved for concepts with insufficient information to code with codable children: Secondary | ICD-10-CM

## 2018-01-23 DIAGNOSIS — I10 Essential (primary) hypertension: Secondary | ICD-10-CM

## 2018-01-23 DIAGNOSIS — Z79899 Other long term (current) drug therapy: Secondary | ICD-10-CM

## 2018-01-23 DIAGNOSIS — Z955 Presence of coronary angioplasty implant and graft: Secondary | ICD-10-CM | POA: Insufficient documentation

## 2018-01-23 DIAGNOSIS — E1122 Type 2 diabetes mellitus with diabetic chronic kidney disease: Secondary | ICD-10-CM

## 2018-01-23 DIAGNOSIS — Z9889 Other specified postprocedural states: Secondary | ICD-10-CM | POA: Diagnosis not present

## 2018-01-23 DIAGNOSIS — J9611 Chronic respiratory failure with hypoxia: Secondary | ICD-10-CM

## 2018-01-23 LAB — POCT GLYCOSYLATED HEMOGLOBIN (HGB A1C)
HbA1c POC (<> result, manual entry): 8 %
HbA1c, POC (controlled diabetic range): 8 % — AB (ref 0.0–7.0)
HbA1c, POC (prediabetic range): 8 % — AB (ref 5.7–6.4)
Hemoglobin A1C: 8 % — AB (ref 4.0–5.6)

## 2018-01-23 LAB — GLUCOSE, POCT (MANUAL RESULT ENTRY): POC Glucose: 249 mg/dL — AB (ref 70–99)

## 2018-01-23 NOTE — Telephone Encounter (Signed)
Physical therapist [facility] called to request verbal orders, she is aware patient has to  re-establish care later this afternoon. Physical therapy Once a week-2wks Twice a week week-4wks Home health orders Once a week-1wk twice a week-4wks PHONE:[phone]

## 2018-01-23 NOTE — Patient Instructions (Signed)
Chronic Obstructive Pulmonary Disease Chronic obstructive pulmonary disease (COPD) is a long-term (chronic) lung problem. When you have COPD, it is hard for air to get in and out of your lungs. The way your lungs work will never return to normal. Usually the condition gets worse over time. There are things you can do to keep yourself as healthy as possible. Your doctor may treat your condition with:  Medicines.  Quitting smoking, if you smoke.  Rehabilitation. This may involve a team of specialists.  Oxygen.  Exercise and changes to your diet.  Lung surgery.  Comfort measures (palliative care).  Follow these instructions at home: Medicines  Take over-the-counter and prescription medicines only as told by your doctor.  Talk to your doctor before taking any cough or allergy medicines. You may need to avoid medicines that cause your lungs to be dry. Lifestyle  If you smoke, stop. Smoking makes the problem worse. If you need help quitting, ask your doctor.  Avoid being around things that make your breathing worse. This may include smoke, chemicals, and fumes.  Stay active, but remember to also rest.  Learn and use tips on how to relax.  Make sure you get enough sleep. Most adults need at least 7 hours a night.  Eat healthy foods. Eat smaller meals more often. Rest before meals. Controlled breathing  Learn and use tips on how to control your breathing as told by your doctor. Try: ? Breathing in (inhaling) through your nose for 1 second. Then, pucker your lips and breath out (exhale) through your lips for 2 seconds. ? Putting one hand on your belly (abdomen). Breathe in slowly through your nose for 1 second. Your hand on your belly should move out. Pucker your lips and breathe out slowly through your lips. Your hand on your belly should move in as you breathe out. Controlled coughing  Learn and use controlled coughing to clear mucus from your lungs. The steps are: 1. Lean your  head a little forward. 2. Breathe in deeply. 3. Try to hold your breath for 3 seconds. 4. Keep your mouth slightly open while coughing 2 times. 5. Spit any mucus out into a tissue. 6. Rest and do the steps again 1 or 2 times as needed. General instructions  Make sure you get all the shots (vaccines) that your doctor recommends. Ask your doctor about a flu shot and a pneumonia shot.  Use oxygen therapy and therapy to help improve your lungs (pulmonary rehabilitation) if told by your doctor. If you need home oxygen therapy, ask your doctor if you should buy a tool to measure your oxygen level (oximeter).  Make a COPD action plan with your doctor. This helps you know what to do if you feel worse than usual.  Manage any other conditions you have as told by your doctor.  Avoid going outside when it is very hot, cold, or humid.  Avoid people who have a sickness you can catch (contagious).  Keep all follow-up visits as told by your doctor. This is important. Contact a doctor if:  You cough up more mucus than usual.  There is a change in the color or thickness of the mucus.  It is harder to breathe than usual.  Your breathing is faster than usual.  You have trouble sleeping.  You need to use your medicines more often than usual.  You have trouble doing your normal activities such as getting dressed or walking around the house. Get help right away if:    You have shortness of breath while resting.  You have shortness of breath that stops you from: ? Being able to talk. ? Doing normal activities.  Your chest hurts for longer than 5 minutes.  Your skin color is more blue than usual.  Your pulse oximeter shows that you have low oxygen for longer than 5 minutes.  You have a fever.  You feel too tired to breathe normally. Summary  Chronic obstructive pulmonary disease (COPD) is a long-term lung problem.  The way your lungs work will never return to normal. Usually the  condition gets worse over time. There are things you can do to keep yourself as healthy as possible.  Take over-the-counter and prescription medicines only as told by your doctor.  If you smoke, stop. Smoking makes the problem worse. This information is not intended to replace advice given to you by your health care provider. Make sure you discuss any questions you have with your health care provider. Document Released: 11/03/2007 Document Revised: 10/23/2015 Document Reviewed: 01/11/2013 Elsevier Interactive Patient Education  2017 Elsevier Inc[employer]  Heart Failure Heart failure means your heart has trouble pumping blood. This makes it hard for your body to work well. Heart failure is usually a long-term (chronic) condition. You must take good care of yourself and follow your doctor's treatment plan. Follow these instructions at home:  Take your heart medicine as told by your doctor. ? Do not stop taking medicine unless your doctor tells you to. ? Do not skip any dose of medicine. ? Refill your medicines before they run out. ? Take other medicines only as told by your doctor or pharmacist.  Stay active if told by your doctor. The elderly and people with severe heart failure should talk with a doctor about physical activity.  Eat heart-healthy foods. Choose foods that are without trans fat and are low in saturated fat, cholesterol, and salt (sodium). This includes fresh or frozen fruits and vegetables, fish, lean meats, fat-free or low-fat dairy foods, whole grains, and high-fiber foods. Lentils and dried peas and beans (legumes) are also good choices.  Limit salt if told by your doctor.  Cook in a healthy way. Roast, grill, broil, bake, poach, steam, or stir-fry foods.  Limit fluids as told by your doctor.  Weigh yourself every morning. Do this after you pee (urinate) and before you eat breakfast. Write down your weight to give to your doctor.  Take your blood pressure and write it down  if your doctor tells you to.  Ask your doctor how to check your pulse. Check your pulse as told.  Lose weight if told by your doctor.  Stop smoking or chewing tobacco. Do not use gum or patches that help you quit without your doctor's approval.  Schedule and go to doctor visits as told.  Nonpregnant women should have no more than 1 drink a day. Men should have no more than 2 drinks a day. Talk to your doctor about drinking alcohol.  Stop illegal drug use.  Stay current with shots (immunizations).  Manage your health conditions as told by your doctor.  Learn to manage your stress.  Rest when you are tired.  If it is really hot outside: ? Avoid intense activities. ? Use air conditioning or fans, or get in a cooler place. ? Avoid caffeine and alcohol. ? Wear loose-fitting, lightweight, and light-colored clothing.  If it is really cold outside: ? Avoid intense activities. ? Layer your clothing. ? Wear mittens or gloves, a   hat, and a scarf when going outside. ? Avoid alcohol.  Learn about heart failure and get support as needed.  Get help to maintain or improve your quality of life and your ability to care for yourself as needed. Contact a doctor if:  You gain weight quickly.  You are more short of breath than usual.  You cannot do your normal activities.  You tire easily.  You cough more than normal, especially with activity.  You have any or more puffiness (swelling) in areas such as your hands, feet, ankles, or belly (abdomen).  You cannot sleep because it is hard to breathe.  You feel like your heart is beating fast (palpitations).  You get dizzy or light-headed when you stand up. Get help right away if:  You have trouble breathing.  There is a change in mental status, such as becoming less alert or not being able to focus.  You have chest pain or discomfort.  You faint. This information is not intended to replace advice given to you by your health care  provider. Make sure you discuss any questions you have with your health care provider. Document Released: 02/24/2008 Document Revised: 10/23/2015 Document Reviewed: 07/03/2012 Elsevier Interactive Patient Education  2017 Elsevier Inc.  

## 2018-01-23 NOTE — Progress Notes (Signed)
Subjective:    Patient ID: Tamara Silva, female    DOB: 1960-06-19, 57 y.o.   MRN: [id number]  HPI        57 year old female who presents in follow-up of recent hospitalization from 01/13/2018 through 01/18/2018 secondary to shortness of breath and patient was found to have acute on chronic combined systolic and diastolic heart failure with echocardiogram on 01/14/2018 showing ejection fraction of 35 to 40% with grade 2 diastolic dysfunction patient also had acute kidney injury with creatinine elevation to 1.21 and patient with chronic microcytic/iron deficiency anemia for which she received transfusion during her hospitalization on 01/19/2018 due to hemoglobin of 7.9.  Patient also with history of uncontrolled diabetes with use of insulin.  Patient with hemoglobin A1c of 10.0 at her last office visit on 10/06/2016.      Patient states that she feels better than she did prior to her recent hospitalization but she remains short of breath with minimal exertion.  Patient continues to have to sleep in her recliner as she feels more short of breath in her bed even with extra pillows to prop herself up.  Patient states that she is taking her Lasix.  Patient states that she has not really been checking her blood sugars and that she has had some lapses in taking her insulin and currently needs a refill of her Novolin.  Patient states that she cannot always afford to obtain all of her medications.  Patient states that she feels stressed financially as well as worrying about her health.  Patient states that leaving her home for doctor's appointments causes her to have increased shortness of breath and she has turned her oxygen up to 4 L.  Patient has difficulty attending office appointments due to not feeling well and transportation issues.  Patient states that she has not obtained all of her medicines status post hospital discharge.  Patient states that she is having some current constipation and wonders if there are any  samples available of MiraLAX which she was prescribed recently.  Patient has seen no blood in the stool.  Patient has had no unusual bleeding.  Patient feels that her legs continue to remain swollen.  Patient does have some occasional numbness and tingling in her feet.  Patient states that she still feels fatigued. Patient does not know if she has all of her prescribed medications because she is not sure she can afford them. Patient uses a mail order pharmacy out of [place] because of difficulty with paying for medications at local pharmacies in the past.  Past Medical History:  Diagnosis Date  . Asthma   . Diabetes mellitus without complication [facility])   Patient also with chronic combined systolic and diastolic heart failure, chronic respiratory failure with hypoxia and home oxygen use, history of pulmonary embolism, morbid obesity, iron deficiency anemia, chronic anticoagulation, CAD status post stent placement to the LAD, obesity hypoventilation syndrome, obstructive sleep apnea with prescribed use of CPAP, history of noncompliance secondary to intermittent use of medications, tobacco use, COPD, dyslipidemia, chronic kidney disease, bipolar disorder, gait disorder, debility and long-term use of insulin. Social History   Tobacco Use  . Smoking status: Former Smoker    Packs/day: 0.50    Years: 20.00    Pack years: 10.00    Types: Cigarettes    Last attempt to quit: 04/30/2014    Years since quitting: 3.7  . Smokeless tobacco: Never Used  . Tobacco comment: smoked off and on x 20 years  Substance Use Topics  . Alcohol use: No    Alcohol/week: 0.0 standard drinks  . Drug use: No   Past Surgical History:  Procedure Laterality Date  . COLONOSCOPY WITH PROPOFOL N/A 08/03/2013   Procedure: COLONOSCOPY WITH PROPOFOL;  Surgeon: Jeryl Columbia, MD;  Location: [facility] ENDOSCOPY;  Service: Endoscopy;  Laterality: N/A;  . CORONARY ANGIOPLASTY WITH STENT PLACEMENT     CAD in 2006 x 2 and 2009 2  more- place din REx in [place] [facility] [place] [facility]  . CORONARY ANGIOPLASTY WITH STENT PLACEMENT  10/07/2013   Xience [place] DES 2.75  mm x 15  mm  . ESOPHAGOGASTRODUODENOSCOPY (EGD) WITH PROPOFOL N/A 08/03/2013   Procedure: ESOPHAGOGASTRODUODENOSCOPY (EGD) WITH PROPOFOL;  Surgeon: Jeryl Columbia, MD;  Location: [facility] ENDOSCOPY;  Service: Endoscopy;  Laterality: N/A;  . LEFT HEART CATHETERIZATION WITH CORONARY ANGIOGRAM N/A 10/07/2013   Procedure: LEFT HEART CATHETERIZATION WITH CORONARY ANGIOGRAM;  Surgeon: Leonie Man, MD;  Location: [facility] CATH LAB;  Service: Cardiovascular;  Laterality: N/A;   Family History  Problem Relation Age of Onset  . Stroke Mother   . Hypertension Mother   . Colon cancer Father   . Diabetes Father   . Heart attack Father   . Sarcoidosis Other   . Breast cancer Paternal Aunt   . Emphysema Brother   . Lung disease Brother        Unknown type, 3 brothers, one with liver and lung disease  . Asthma Paternal Aunt    Allergies  Allergen Reactions  . Metolazone Other (See Comments)    Dizziness and falling  . Plavix [Clopidogrel Bisulfate] Other (See Comments)    High PRU's - non-responder  . Ibuprofen     Patient stated she can not take ibuprofen.   . Nsaids Other (See Comments)    "I do not take NSAIDs d/t interfering with other meds"    Review of Systems  Constitutional: Positive for fatigue. Negative for chills and fever.  HENT: Positive for congestion (Nasal congestion associated with her use of oxygen). Negative for sore throat and trouble swallowing.   Respiratory: Positive for shortness of breath and wheezing. Negative for cough.   Cardiovascular: Positive for leg swelling. Negative for chest pain and palpitations.  Gastrointestinal: Positive for constipation. Negative for abdominal pain.  Genitourinary: Positive for frequency (Patient attributes urinary frequency to use of Lasix). Negative for dysuria.  Musculoskeletal: Positive for back pain (Back pain has been  chronic and worsened by sitting in wheelchair and use of recliner for sleep) and gait problem.  Neurological: Negative for dizziness and headaches.  Hematological: Negative for adenopathy. Bruises/bleeds easily (Patient tends to bruise easily due to her use of anticoagulant medication but states that she has had no unusual bleeding).  Psychiatric/Behavioral: Positive for sleep disturbance. Negative for suicidal ideas. The patient is nervous/anxious.        Objective:   Physical Exam BP 134/80 (BP Location: Left Arm, Patient Position: Sitting, Cuff Size: Normal)   Pulse 68   Temp 98.4 F (36.9 C) (Oral)   Resp (!) 24   Ht 5\' 7"  (1.702 m)   Wt (!) 324 lb (147 kg)   LMP 07/28/2013 Comment: perimenopausal  SpO2 (!) 84%   BMI 50.75 kg/m  Vital signs reviewed General- morbidly obese female in no acute distress sitting in a wheelchair wearing oxygen by nasal cannula.  Patient with oxygen of 4 L as she states that she has been told in the past that she could  increase from baseline 2 L up to 4 L with travel or activity.  Patient is accompanied by her boyfriend at today's visit (boyfriend appears to speak limited English and did not contribute to medical history) ENT- TMs gray, nares with edema of the nasal turbinates, patient with a narrowed posterior airway and large tongue base. Neck-supple, no lymphadenopathy, no JVD Cardiovascular-regular rate and rhythm Lungs- patient with some decreased air movement and decreased breath sounds at the lung bases but no rales appreciated on auscultation Abdomen-truncal obesity, soft and nontender Extremities- patient with 2+ edema from ankles to mid shin and 1+ pitting edema from midshin up to the knees Diabetic foot exam- patient with pedal edema which may have affected pedal pulses which were 1+ and dorsalis pedis and posterior tibial.  Patient did have normal monofilament exam but also does have dry, thickened skin on the feet but no active skin  breakdown.      Assessment & Plan:  1. Chronic combined systolic and diastolic congestive heart failure, NYHA class 2; EF 30-35% Notes and records from patient's recent hospitalization on 01/13/2018 were reviewed.  Patient had echocardiogram on 01/14/2018 with ejection fraction of 35 to 40% with grade 2 diastolic dysfunction which was consistent with prior studies.  Patient's presentation with shortness of breath was thought to be secondary to noncompliance with medication as well as poor diet controlled and patient was diuresed with the use of Lasix.  Patient was discharged home on 60 mg twice daily of oral Lasix and was to continue use of baseline 2 L nasal cannula.  Patient was also discharged on Cozaar 50 mg and spironolactone.  At today's visit I again stressed importance of compliance with medications as well as low-salt diet, checking her weights and fluid restriction.  Patient will have BMP and BNP done at today's visit and will be notified of the results.  If patient has acute onset shortness of breath or gains more than 3 pounds in a 24-hour period or 5 pounds within a week or increased peripheral edema, she should contact this office or seek evaluation at the local emergency department. - Basic Metabolic Panel - Brain natriuretic peptide  2. Microcytic anemia Patient received 1 unit of packed red blood cells during her hospitalization as well as one dose of IV Feraheme and patient has been discharged taking a daily iron supplement, Niferex.  Patient was provided with samples of MiraLAX as she had not yet obtain this from the pharmacy and the importance of using MiraLAX and/or senna to help avoid constipation due to iron therapy was stressed with the patient. - CBC with Differential  3. Uncontrolled type 2 diabetes mellitus with stage 2 chronic kidney disease, with long-term current use of insulin ([place]) Patient admits to running out of insulin at times or not being adherent.  Discussed  importance of controlling her blood sugars.  Patient stated that she did not need a refill of her Levemir which she is currently on at 45 units daily.  Patient did request refill for NovoLog for which she is to take 15-20 units pre-meal 3 times daily and I discussed the importance of compliance to help with overall control of blood sugar.  Patient will have hemoglobin A1c and BMP at today's visit.  Patient's blood sugar was 249. - Glucose (CBG) - HgB N2T - Basic Metabolic Panel  4. 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  Patient is to continue Anoro and DuoNeb use as needed as well as 2 L of oxygen by nasal cannula which is her baseline due to chronic hypoxic respiratory failure.  6.  Coronary artery disease Refill sent to patient's pharmacy for nitroglycerin and for atorvastatin.  Patient is also to continue Effient as well as to continue omeprazole for stomach protection.  -Patient will also be referred to be contacted by the social worker for further evaluation of financial and emotional (bipolar disorder) difficulties and abnormal PHQ 9 screen - Ambulatory referral to Pulmonology -Patient was offered but declined influenza immunization  An After Visit Summary was printed and given to the patient.  Return in about 3 weeks (around 02/13/2018) for CHF/COPD.

## 2018-01-24 LAB — BASIC METABOLIC PANEL WITH GFR
BUN/Creatinine Ratio: 20 (ref 9–23)
BUN: 22 mg/dL (ref 6–24)
CO2: 30 mmol/L — ABNORMAL HIGH (ref 20–29)
Calcium: 9 mg/dL (ref 8.7–10.2)
Chloride: 94 mmol/L — ABNORMAL LOW (ref 96–106)
Creatinine, Ser: 1.12 mg/dL — ABNORMAL HIGH (ref 0.57–1.00)
GFR calc Af Amer: 63 mL/min/1.73
GFR calc non Af Amer: 54 mL/min/1.73 — ABNORMAL LOW
Glucose: 184 mg/dL — ABNORMAL HIGH (ref 65–99)
Potassium: 4.6 mmol/L (ref 3.5–5.2)
Sodium: 138 mmol/L (ref 134–144)

## 2018-01-24 LAB — CBC WITH DIFFERENTIAL/PLATELET

## 2018-01-24 LAB — BRAIN NATRIURETIC PEPTIDE: BNP: 617.6 pg/mL — ABNORMAL HIGH (ref 0.0–100.0)

## 2018-01-24 NOTE — Telephone Encounter (Signed)
Called Maria to give verbal orders, No answer. If maria calls back please let nurse know so she can give verbal orders.

## 2018-01-24 NOTE — Telephone Encounter (Signed)
Okay to give verbal orders for the home care and physical therapy

## 2018-01-26 NOTE — Telephone Encounter (Signed)
Yes, the earlier message was for you to call to give the verbal order

## 2018-01-26 NOTE — Telephone Encounter (Signed)
Please call again regarding patient's home care and please notify patient of her recent labs and have her go to the ED if she is still having SOB and leg swelling

## 2018-01-27 DIAGNOSIS — J9611 Chronic respiratory failure with hypoxia: Secondary | ICD-10-CM | POA: Insufficient documentation

## 2018-01-27 DIAGNOSIS — D509 Iron deficiency anemia, unspecified: Secondary | ICD-10-CM | POA: Insufficient documentation

## 2018-01-27 MED ORDER — NITROGLYCERIN 0.4 MG SL SUBL: 0.4000 mg | SUBLINGUAL_TABLET | SUBLINGUAL | 11 refills | Status: DC | PRN

## 2018-01-27 MED ORDER — UMECLIDINIUM-VILANTEROL 62.5-25 MCG/INH IN AEPB: 1.0000 | INHALATION_SPRAY | Freq: Once | RESPIRATORY_TRACT | 3 refills | Status: AC

## 2018-01-27 MED ORDER — SPIRONOLACTONE 25 MG PO TABS: 25.0000 mg | ORAL_TABLET | Freq: Every day | ORAL | 3 refills | Status: DC

## 2018-01-27 MED ORDER — ROSUVASTATIN CALCIUM 10 MG PO TABS: 10.0000 mg | ORAL_TABLET | Freq: Every day | ORAL | 2 refills | Status: DC

## 2018-01-27 MED ORDER — INSULIN ASPART 100 UNIT/ML FLEXPEN: PEN_INJECTOR | SUBCUTANEOUS | 0 refills | Status: DC

## 2018-01-27 MED ORDER — IPRATROPIUM-ALBUTEROL 0.5-2.5 (3) MG/3ML IN SOLN: RESPIRATORY_TRACT | 11 refills | Status: DC

## 2018-01-27 MED ORDER — POLYSACCHARIDE IRON COMPLEX 150 MG PO CAPS: 150.0000 mg | ORAL_CAPSULE | Freq: Every day | ORAL | 11 refills | Status: DC

## 2018-01-27 MED ORDER — LOSARTAN POTASSIUM 50 MG PO TABS: 50.0000 mg | ORAL_TABLET | Freq: Every day | ORAL | 1 refills | Status: DC

## 2018-01-27 MED ORDER — "INSULIN SYRINGE 29G X 1/2"" 0.5 ML MISC": 12 refills | Status: DC

## 2018-01-27 MED ORDER — POLYETHYLENE GLYCOL 3350 17 G PO PACK: 17.0000 g | PACK | Freq: Every day | ORAL | 6 refills | Status: DC

## 2018-01-27 NOTE — Telephone Encounter (Signed)
Patient was contacted. Patient answered, verified DOB, and patient was informed of most recent lab results. Patient verbalized understanding and stated that she would wait to redo labs at her next office visit.

## 2018-01-27 NOTE — Telephone Encounter (Signed)
Patient called requesting lab results, please follow up with patient.

## 2018-01-27 NOTE — Telephone Encounter (Signed)
Attempted to reach patient to go over lab results, called and went straight to vm. Lvm informing patient to return call to [facility] at her earliest convenience.

## 2018-01-27 NOTE — Telephone Encounter (Signed)
Verdis Frederickson called, spoke to pcp nurse, nurse gave verbal orders. No further questions from [facility].

## 2018-02-01 ENCOUNTER — Telehealth: Payer: Self-pay | Admitting: Family Medicine

## 2018-02-01 ENCOUNTER — Other Ambulatory Visit: Payer: Self-pay | Admitting: Family Medicine

## 2018-02-01 NOTE — Telephone Encounter (Signed)
Tamara Silva called requesting for nurse to return call because she has some concerns after a home visit please follow up.

## 2018-02-02 ENCOUNTER — Other Ambulatory Visit: Payer: Self-pay | Admitting: Family Medicine

## 2018-02-02 ENCOUNTER — Telehealth: Payer: Self-pay | Admitting: Family Medicine

## 2018-02-02 ENCOUNTER — Other Ambulatory Visit: Payer: Self-pay

## 2018-02-02 ENCOUNTER — Other Ambulatory Visit (HOSPITAL_COMMUNITY): Payer: Self-pay | Admitting: Cardiology

## 2018-02-02 DIAGNOSIS — N182 Chronic kidney disease, stage 2 (mild): Principal | ICD-10-CM

## 2018-02-02 DIAGNOSIS — E1165 Type 2 diabetes mellitus with hyperglycemia: Secondary | ICD-10-CM

## 2018-02-02 DIAGNOSIS — E1122 Type 2 diabetes mellitus with diabetic chronic kidney disease: Secondary | ICD-10-CM

## 2018-02-02 DIAGNOSIS — IMO0002 Reserved for concepts with insufficient information to code with codable children: Secondary | ICD-10-CM

## 2018-02-02 DIAGNOSIS — Z794 Long term (current) use of insulin: Principal | ICD-10-CM

## 2018-02-02 MED ORDER — INSULIN PEN NEEDLE 32G X 4 MM MISC: 11 refills | Status: DC

## 2018-02-02 NOTE — Telephone Encounter (Signed)
Attempted to call Earlie Server back regarding patient, no answer, lvm to return call.

## 2018-02-02 NOTE — Telephone Encounter (Signed)
Tamara Silva called back to speak with [name] in regards to a patient.  Please follow up [phone].

## 2018-02-02 NOTE — Telephone Encounter (Signed)
Patient called requesting a paper to be faxed to [facility] transportation for her appointment on 09/16. Please follow up with patient.

## 2018-02-06 NOTE — Telephone Encounter (Signed)
Patient called again. Patient wanted to speak with Dr.fulp regarding transportation for her appt. Scheduled for tomorrow Sep. 10th. Please follow up with patient.

## 2018-02-07 ENCOUNTER — Telehealth: Payer: Self-pay

## 2018-02-07 ENCOUNTER — Inpatient Hospital Stay: Payer: Self-pay | Admitting: Primary Care

## 2018-02-07 NOTE — Telephone Encounter (Signed)
Patient requested for a letter stating that she needs an escort when she is using medicaid transportation. Also to be faxed to [facility] transport. Patient stated that she would like for the pcp to refer to a lung specialist who will do x-rays and give her some options on things that could be done to better her health condition. Patient stated that she requests a call from her pcp for further questions. Please fu at your earliest convenience.

## 2018-02-08 NOTE — Telephone Encounter (Signed)
Please let patient know that I discussed with her at her last visit that she was being referred to a pulmonologist for further evaluation as she needed follow-up of a prior abnormal CT scan.  I will also contact patient

## 2018-02-13 ENCOUNTER — Other Ambulatory Visit: Payer: Self-pay | Admitting: Family Medicine

## 2018-02-13 ENCOUNTER — Ambulatory Visit: Payer: Self-pay | Admitting: Family Medicine

## 2018-02-13 DIAGNOSIS — R0602 Shortness of breath: Secondary | ICD-10-CM

## 2018-02-22 ENCOUNTER — Telehealth: Payer: Self-pay | Admitting: Family Medicine

## 2018-02-22 NOTE — Telephone Encounter (Signed)
Will forward to pcp

## 2018-02-22 NOTE — Telephone Encounter (Signed)
Maria from [facility] called stating that patient Tamara Silva was discharged and has refused any more physical therapy.

## 2018-02-23 ENCOUNTER — Other Ambulatory Visit: Payer: Self-pay | Admitting: Family Medicine

## 2018-02-23 DIAGNOSIS — IMO0002 Reserved for concepts with insufficient information to code with codable children: Secondary | ICD-10-CM

## 2018-02-23 DIAGNOSIS — E1122 Type 2 diabetes mellitus with diabetic chronic kidney disease: Secondary | ICD-10-CM

## 2018-02-23 DIAGNOSIS — Z794 Long term (current) use of insulin: Secondary | ICD-10-CM

## 2018-02-23 DIAGNOSIS — N182 Chronic kidney disease, stage 2 (mild): Secondary | ICD-10-CM

## 2018-02-23 DIAGNOSIS — J441 Chronic obstructive pulmonary disease with (acute) exacerbation: Secondary | ICD-10-CM

## 2018-02-23 DIAGNOSIS — I10 Essential (primary) hypertension: Secondary | ICD-10-CM

## 2018-02-23 DIAGNOSIS — E1165 Type 2 diabetes mellitus with hyperglycemia: Secondary | ICD-10-CM

## 2018-02-23 NOTE — Telephone Encounter (Signed)
ok 

## 2018-02-23 NOTE — Telephone Encounter (Signed)
[place]. Patient cannot be forced to participate especially if she is not willing to cooperate. Please contact the patient and ask her if she has made a follow-up appointment with her lung doctor. Please also ask patient to reschedule her office visit here as she missed her last visit. Let patient know that I also placed an order for her to have a CT scan of her chest. (Please check on status of test and please schedule if not already scheduled and if not scheduled then also find out from patient what the best day or time would be best for her to be able to attend the test)

## 2018-02-23 NOTE — Telephone Encounter (Signed)
Will be doing prior auth first before scheduling CT

## 2018-02-24 MED ORDER — FUROSEMIDE 40 MG PO TABS: 60.0000 mg | ORAL_TABLET | Freq: Two times a day (BID) | ORAL | 0 refills | Status: DC

## 2018-02-24 NOTE — Telephone Encounter (Signed)
Faxed clinical information

## 2018-02-24 NOTE — Telephone Encounter (Signed)
Will forward to pcp  I have asked Dr. Chapman Fitch if she would be able to add additonal dx codes for the CT that she has order

## 2018-02-24 NOTE — Telephone Encounter (Signed)
Patient with prior abnormal chest CT-R93.89 and hilar lymphadenopathy R93.89

## 2018-02-27 NOTE — Telephone Encounter (Signed)
Went on the [employer] on 02/24/2018 and did prior auth for CT before scheudling  [id number] CT chest with contrast    ICD- R06.02-SHORTNESS OF BREATH          R59.0-LOCALIZED ENLARGED LYMPH NODES          R93.89-ABNORMAL FINDINGS ON DIAGNOSTIC IMAGING OF OTHER SPECIFIED BODY STRUCTURES      Service # [id number]    Answered clinical information and they are wanting additional clinical information. Faxed clinical information just waiting for a response

## 2018-03-01 ENCOUNTER — Ambulatory Visit: Payer: Self-pay | Admitting: Family Medicine

## 2018-03-01 ENCOUNTER — Other Ambulatory Visit: Payer: Self-pay | Admitting: Family Medicine

## 2018-03-01 DIAGNOSIS — N182 Chronic kidney disease, stage 2 (mild): Secondary | ICD-10-CM

## 2018-03-01 DIAGNOSIS — E1165 Type 2 diabetes mellitus with hyperglycemia: Secondary | ICD-10-CM

## 2018-03-01 DIAGNOSIS — E1122 Type 2 diabetes mellitus with diabetic chronic kidney disease: Secondary | ICD-10-CM

## 2018-03-01 DIAGNOSIS — I10 Essential (primary) hypertension: Secondary | ICD-10-CM

## 2018-03-01 DIAGNOSIS — IMO0002 Reserved for concepts with insufficient information to code with codable children: Secondary | ICD-10-CM

## 2018-03-01 DIAGNOSIS — Z794 Long term (current) use of insulin: Secondary | ICD-10-CM

## 2018-03-20 ENCOUNTER — Telehealth: Payer: Self-pay | Admitting: Family Medicine

## 2018-03-20 NOTE — Telephone Encounter (Signed)
Ok, thanks for the follow-up and information

## 2018-03-20 NOTE — Telephone Encounter (Signed)
Pt called to request a new portable oxygen tank, she is having trouble with her old one. She states that it does not run continuously anymore,please follow up to [address], [place], [place] [address]

## 2018-03-20 NOTE — Telephone Encounter (Signed)
I will route note to Opal Sidles but patient has not kept her recent follow-up appointments here and has not followed up with pulmonology either as recommended (unless I just did not get the referral note)

## 2018-03-20 NOTE — Telephone Encounter (Signed)
Message received from [place] at [facility] returning CM call.   Call returned to [place], who was unavailable. Spoke to [place] and explained the patient's request for continuous flow portable O2 concentrator.  Iona Beard said that they would have a respiratory therapist  contact the patient and schedule an appointment  to go out to see her and show her the options available. He would then call this CM back with the results of the home assessment.

## 2018-03-20 NOTE — Telephone Encounter (Signed)
Call placed to the patient.  She said that she needs continuous flow O2 portable O2 concentrator,  She explained that she is not able to breathe well with the current concentrator and if she can't breath well, she is not able to go to her medical appointments.  Informed her that this CM would check with [employer] to inquire about her options.  Call placed to [facility] # [phone], spoke to [place] and explained the patient's request.  Lattie Haw said that the patient currently has a pulse activated portable O2 concentrator. She said that she will need to check with the respiratory therapist to inquire about other options for the patient besides smaller portable tanks with regulator. She said that she would have the respiratory therapist call this CM back # [phone].

## 2018-03-23 ENCOUNTER — Other Ambulatory Visit: Payer: Self-pay | Admitting: Family Medicine

## 2018-03-23 DIAGNOSIS — N182 Chronic kidney disease, stage 2 (mild): Secondary | ICD-10-CM

## 2018-03-23 DIAGNOSIS — IMO0002 Reserved for concepts with insufficient information to code with codable children: Secondary | ICD-10-CM

## 2018-03-23 DIAGNOSIS — Z794 Long term (current) use of insulin: Secondary | ICD-10-CM

## 2018-03-23 DIAGNOSIS — I10 Essential (primary) hypertension: Secondary | ICD-10-CM

## 2018-03-23 DIAGNOSIS — E1165 Type 2 diabetes mellitus with hyperglycemia: Secondary | ICD-10-CM

## 2018-03-23 DIAGNOSIS — E1122 Type 2 diabetes mellitus with diabetic chronic kidney disease: Secondary | ICD-10-CM

## 2018-03-24 ENCOUNTER — Telehealth (HOSPITAL_COMMUNITY): Payer: Self-pay | Admitting: *Deleted

## 2018-03-24 NOTE — Telephone Encounter (Signed)
Med records faxed to disability per pt signed medical records release form.  

## 2018-03-29 ENCOUNTER — Other Ambulatory Visit: Payer: Self-pay | Admitting: Family Medicine

## 2018-03-29 DIAGNOSIS — Z794 Long term (current) use of insulin: Principal | ICD-10-CM

## 2018-03-29 DIAGNOSIS — E1165 Type 2 diabetes mellitus with hyperglycemia: Secondary | ICD-10-CM

## 2018-03-29 DIAGNOSIS — N182 Chronic kidney disease, stage 2 (mild): Principal | ICD-10-CM

## 2018-03-29 DIAGNOSIS — E1122 Type 2 diabetes mellitus with diabetic chronic kidney disease: Secondary | ICD-10-CM

## 2018-03-29 DIAGNOSIS — IMO0002 Reserved for concepts with insufficient information to code with codable children: Secondary | ICD-10-CM

## 2018-04-24 ENCOUNTER — Other Ambulatory Visit: Payer: Self-pay | Admitting: Family Medicine

## 2018-04-24 ENCOUNTER — Other Ambulatory Visit (HOSPITAL_COMMUNITY): Payer: Self-pay | Admitting: Cardiology

## 2018-04-24 DIAGNOSIS — N182 Chronic kidney disease, stage 2 (mild): Secondary | ICD-10-CM

## 2018-04-24 DIAGNOSIS — J441 Chronic obstructive pulmonary disease with (acute) exacerbation: Secondary | ICD-10-CM

## 2018-04-24 DIAGNOSIS — E1122 Type 2 diabetes mellitus with diabetic chronic kidney disease: Secondary | ICD-10-CM

## 2018-04-24 DIAGNOSIS — IMO0002 Reserved for concepts with insufficient information to code with codable children: Secondary | ICD-10-CM

## 2018-04-24 DIAGNOSIS — I10 Essential (primary) hypertension: Secondary | ICD-10-CM

## 2018-04-24 DIAGNOSIS — Z794 Long term (current) use of insulin: Secondary | ICD-10-CM

## 2018-04-24 DIAGNOSIS — E1165 Type 2 diabetes mellitus with hyperglycemia: Secondary | ICD-10-CM

## 2018-04-26 ENCOUNTER — Telehealth: Payer: Self-pay

## 2018-04-26 NOTE — Telephone Encounter (Signed)
Call placed to the patient and explained the paramedicine program to her.  She was in agreement to participate.

## 2018-04-26 NOTE — Telephone Encounter (Signed)
paramedicine referral sent to [facility].

## 2018-05-10 ENCOUNTER — Telehealth (HOSPITAL_COMMUNITY): Payer: Self-pay

## 2018-05-10 NOTE — Telephone Encounter (Signed)
Pt called to schedule CHP visit. She requested that we visit next Tuesday @ 11:30

## 2018-05-16 ENCOUNTER — Other Ambulatory Visit (HOSPITAL_COMMUNITY): Payer: Self-pay

## 2018-05-16 NOTE — Progress Notes (Signed)
Paramedicine Encounter    Patient ID: Tamara Silva, female    DOB: December 03, 1960, 57 y.o.   MRN: [id number]    Patient Care Team: Antony Blackbird, MD as PCP - General (Family Medicine)  Patient Active Problem List   Diagnosis Date Noted  . Chronic respiratory failure with hypoxia, on home O2 therapy ([place]) 01/27/2018  . Iron deficiency anemia 01/27/2018  . Tobacco abuse disorder 10/03/2017  . Dyslipidemia 04/29/2016  . Primary insomnia 04/29/2016  . Hypoxemia 09/08/2015  . CHF (congestive heart failure) ([place]) 09/08/2015  . Obesity hypoventilation syndrome ([place])   . Microcytic anemia   . Acute encephalopathy   . Uncontrolled type 2 diabetes mellitus with hyperosmolarity without coma ([place])   . Acute on chronic respiratory failure with hypercapnia ([place])   . CAP (community acquired pneumonia)   . OSA (obstructive sleep apnea)   . Acute on chronic respiratory failure ([place]) 06/22/2015  . Acute on chronic combined systolic (congestive) and diastolic (congestive) heart failure ([place]) 06/21/2015  . Hypoglycemia 06/21/2015  . Uncontrolled type 2 diabetes mellitus without complication, with long-term current use of insulin ([place])   . Dyslipidemia associated with type 2 diabetes mellitus ([place]) 05/21/2015  . Anemia of chronic kidney failure 05/21/2015  . Hyperkalemia 05/21/2015  . COPD GOLD III  06/10/2014  . Acute respiratory failure with hypoxia ([place]) 03/04/2014  . CKD (chronic kidney disease), stage II 02/07/2014  . COPD exacerbation ([place]) 02/01/2014  . CAD S/P percutaneous coronary angioplasty - prior PCI to LAD; RCA PCI: new Xience [place] DES 2.75 mm x 15 mm  10/07/2013  . Morbid obesity ([place]) 07/30/2013  . Chronic combined systolic and diastolic heart failure ([place]) 07/10/2013  . Chronic pulmonary embolism ([place]) 02/08/2013  . Bipolar disorder ([place]) 02/08/2013  . Essential hypertension 02/08/2013  . Chronic anticoagulation 11/27/2012  . Edema extremities 10/15/2012  . COPD (chronic  obstructive pulmonary disease) ([place]) 10/15/2012  . Pulmonary embolism on right ([place]) 10/15/2012  . Hypertension 10/15/2012  . Insulin dependent diabetes mellitus ([place]) 10/15/2012    Current Outpatient Medications:  .  ACCU-CHEK AVIVA PLUS test strip, USE 1 STRIP TO CHECK GLUCOSE 3 TIMES DAILY, Disp: 100 each, Rfl: 0 .  ACCU-CHEK FASTCLIX LANCETS MISC, 1 applicator by Does not apply route 4 (four) times daily -  before meals and at bedtime., Disp: 100 each, Rfl: 11 .  ACCU-CHEK SOFTCLIX LANCETS lancets, USE 1 LANCET TO CHECK GLUCOSE 3 TIMES DAILY, Disp: 100 each, Rfl: 10 .  aspirin 81 MG tablet, Take 1 tablet (81 mg total) by mouth daily., Disp: , Rfl:  .  Blood Glucose Monitoring Suppl (ACCU-CHEK AVIVA) device, Use as instructed 3 times daily before meals., Disp: 1 each, Rfl: 0 .  carvedilol (COREG) 12.5 MG tablet, TAKE 1 TABLET BY MOUTH 2 TIMES A DAY WITH A MEAL **PT NEEDS TO SCHEDULE AN APPT WITH OUR OFFICE AT [phone] FOR FUTURE REFILLS, Disp: 60 tablet, Rfl: 1 .  fluticasone (FLONASE) 50 MCG/ACT nasal spray, INSTILL 2 SPRAYS IN EACH NOSTRIL TWICE DAILY., Disp: 1 g, Rfl: 1 .  furosemide (LASIX) 40 MG tablet, Take 1.5 tablets (60 mg total) by mouth 2 (two) times daily., Disp: 90 tablet, Rfl: 0 .  Insulin Pen Needle (TRUEPLUS PEN NEEDLES) 32G X 4 MM MISC, Use three times daily to inject insulin, Disp: 100 each, Rfl: 11 .  INSULIN SYRINGE .5CC/29G 29G X 1/2" 0.5 ML MISC, Use as instructed by physician, Disp: 100 each, Rfl: 12 .  ipratropium-albuterol (DUONEB) 0.5-2.5 (3) MG/3ML  SOLN, Use nebulizer to inhale contents of one vial every 6 hours as needed for shortness of breath, wheezing, Disp: 360 mL, Rfl: 11 .  iron polysaccharides (NIFEREX) 150 MG capsule, Take 1 capsule (150 mg total) by mouth daily., Disp: 30 capsule, Rfl: 11 .  LEVEMIR 100 UNIT/ML injection, INJECT 45 UNITS SUBCUTANEOUSLY EVERY NIGHT AT BEDTIME *MUST HAVE OFFICE VISIT FOR REFILLS*, Disp: 2 vial, Rfl: 0 .  losartan (COZAAR)  50 MG tablet, Take 1 tablet (50 mg total) by mouth daily., Disp: 90 tablet, Rfl: 1 .  nitroGLYCERIN (NITROSTAT) 0.4 MG SL tablet, Place 1 tablet (0.4 mg total) under the tongue every 5 (five) minutes as needed for chest pain., Disp: 25 tablet, Rfl: 11 .  NOVOLOG FLEXPEN 100 UNIT/ML FlexPen, INJECT 15-20 UNITS SUBCUTANEOUSLY 3 TIMES DAILY WITH MEALS. **PATIENT MUST HAVE OFFICE VISIT FOR FUTURE REFILLS**, Disp: 2 pen, Rfl: 0 .  omeprazole (PRILOSEC) 40 MG capsule, TAKE 1 CAPSULE BY MOUTH TWICE DAILY., Disp: 60 capsule, Rfl: 1 .  polyethylene glycol (MIRALAX / GLYCOLAX) packet, Take 17 g by mouth daily., Disp: 30 each, Rfl: 6 .  prasugrel (EFFIENT) 10 MG TABS tablet, Take 1 tablet (10 mg total) by mouth daily., Disp: 90 tablet, Rfl: 3 .  prazosin (MINIPRESS) 5 MG capsule, TAKE 1 CAPSULE BY MOUTH EVERY NIGHT AT BEDTIME FOR NIGHTMARES., Disp: 30 capsule, Rfl: 0 .  PROAIR HFA 108 (90 Base) MCG/ACT inhaler, INHALE 2 PUFFS BY MOUTH 4 TIMES DAILY AS NEEDED, Disp: 8.5 each, Rfl: 2 .  rosuvastatin (CRESTOR) 10 MG tablet, Take 1 tablet (10 mg total) by mouth daily., Disp: 90 tablet, Rfl: 2 .  senna (SENOKOT) 8.6 MG TABS tablet, Take 1 tablet (8.6 mg total) by mouth 2 (two) times daily., Disp: 120 each, Rfl: 0 .  spironolactone (ALDACTONE) 25 MG tablet, Take 1 tablet (25 mg total) by mouth daily., Disp: 90 tablet, Rfl: 3 Allergies  Allergen Reactions  . Metolazone Other (See Comments)    Dizziness and falling  . Plavix [Clopidogrel Bisulfate] Other (See Comments)    High PRU's - non-responder  . Ibuprofen     Patient stated she can not take ibuprofen.   . Nsaids Other (See Comments)    "I do not take NSAIDs d/t interfering with other meds"     Social History   Socioeconomic History  . Marital status: Single    Spouse name: Not on file  . Number of children: 1  . Years of education: Not on file  . Highest education level: Not on file  Occupational History  . Not on file  Social Needs  .  Financial resource strain: Somewhat hard  . Food insecurity:    Worry: Sometimes true    Inability: Sometimes true  . Transportation needs:    Medical: Yes    Non-medical: Yes  Tobacco Use  . Smoking status: Former Smoker    Packs/day: 0.50    Years: 20.00    Pack years: 10.00    Types: Cigarettes    Last attempt to quit: 04/30/2014    Years since quitting: 4.0  . Smokeless tobacco: Never Used  . Tobacco comment: smoked off and on x 20 years  Substance and Sexual Activity  . Alcohol use: No    Alcohol/week: 0.0 standard drinks  . Drug use: No  . Sexual activity: Never  Lifestyle  . Physical activity:    Days per week: 0 days    Minutes per session: 0 min  . Stress:  Rather much  Relationships  . Social connections:    Talks on phone: Twice a week    Gets together: Once a week    Attends religious service: 1 to 4 times per year    Active member of club or organization: No    Attends meetings of clubs or organizations: Never    Relationship status: Living with partner  . Intimate partner violence:    Fear of current or ex partner: Not on file    Emotionally abused: Not on file    Physically abused: Not on file    Forced sexual activity: Not on file  Other Topics Concern  . Not on file  Social History Narrative   Origibnally from [place]   Most recently from [place]   Daughter lives in town      On Social security to CHF, COPD, CAD    Physical Exam      No future appointments.   LMP 07/28/2013 Comment: perimenopausal   ATF pt CAO x4 laying prone on the bed. CHP visit for today but pt stated that she "wasn't in the mood to have a visit".  She rescheduled our initial visit to thrusday.  During our conversation she stated that she didn't know how to change the oxygen tank. Pt is on continous oxygen throughout the day. . She allowed me to come over to change out the oxygen tank.  She stated that her "behavior medications isn't working right".  Pt stated that she  has been on the same medications for over a year and no changes has been made recently.  She's unable to provide details on her mood or how she's feeling.    She stated that I can come back for a regular visit Thursday this week.    Medication ordered: none  Elmina Hendel, EMT Paramedic [phone] 05/16/2018    ACTION: Home visit completed

## 2018-05-19 ENCOUNTER — Other Ambulatory Visit: Payer: Self-pay | Admitting: Family Medicine

## 2018-05-19 DIAGNOSIS — I10 Essential (primary) hypertension: Secondary | ICD-10-CM

## 2018-05-19 DIAGNOSIS — E1122 Type 2 diabetes mellitus with diabetic chronic kidney disease: Secondary | ICD-10-CM

## 2018-05-19 DIAGNOSIS — Z794 Long term (current) use of insulin: Secondary | ICD-10-CM

## 2018-05-19 DIAGNOSIS — IMO0002 Reserved for concepts with insufficient information to code with codable children: Secondary | ICD-10-CM

## 2018-05-19 DIAGNOSIS — N182 Chronic kidney disease, stage 2 (mild): Secondary | ICD-10-CM

## 2018-05-19 DIAGNOSIS — J441 Chronic obstructive pulmonary disease with (acute) exacerbation: Secondary | ICD-10-CM

## 2018-05-19 DIAGNOSIS — E1165 Type 2 diabetes mellitus with hyperglycemia: Secondary | ICD-10-CM

## 2018-05-22 ENCOUNTER — Other Ambulatory Visit (HOSPITAL_COMMUNITY): Payer: Self-pay | Admitting: Cardiology

## 2018-05-29 ENCOUNTER — Observation Stay (HOSPITAL_COMMUNITY): Payer: Medicaid Other

## 2018-05-29 ENCOUNTER — Other Ambulatory Visit: Payer: Self-pay

## 2018-05-29 ENCOUNTER — Emergency Department (HOSPITAL_COMMUNITY): Payer: Medicaid Other

## 2018-05-29 ENCOUNTER — Encounter (HOSPITAL_COMMUNITY): Payer: Self-pay

## 2018-05-29 ENCOUNTER — Inpatient Hospital Stay (HOSPITAL_COMMUNITY)
Admission: EM | Admit: 2018-05-29 | Discharge: 2018-06-09 | DRG: 064 | Disposition: A | Discharge status: Rehabilitation Facility | Payer: Medicaid Other | Attending: Internal Medicine | Admitting: Internal Medicine

## 2018-05-29 DIAGNOSIS — R159 Full incontinence of feces: Secondary | ICD-10-CM | POA: Diagnosis present

## 2018-05-29 DIAGNOSIS — R471 Dysarthria and anarthria: Secondary | ICD-10-CM | POA: Diagnosis not present

## 2018-05-29 DIAGNOSIS — E669 Obesity, unspecified: Secondary | ICD-10-CM

## 2018-05-29 DIAGNOSIS — I5043 Acute on chronic combined systolic (congestive) and diastolic (congestive) heart failure: Secondary | ICD-10-CM | POA: Diagnosis present

## 2018-05-29 DIAGNOSIS — I63411 Cerebral infarction due to embolism of right middle cerebral artery: Principal | ICD-10-CM

## 2018-05-29 DIAGNOSIS — Z955 Presence of coronary angioplasty implant and graft: Secondary | ICD-10-CM

## 2018-05-29 DIAGNOSIS — Z7982 Long term (current) use of aspirin: Secondary | ICD-10-CM

## 2018-05-29 DIAGNOSIS — Z886 Allergy status to analgesic agent status: Secondary | ICD-10-CM

## 2018-05-29 DIAGNOSIS — E662 Morbid (severe) obesity with alveolar hypoventilation: Secondary | ICD-10-CM | POA: Diagnosis present

## 2018-05-29 DIAGNOSIS — Z79899 Other long term (current) drug therapy: Secondary | ICD-10-CM

## 2018-05-29 DIAGNOSIS — E872 Acidosis: Secondary | ICD-10-CM | POA: Diagnosis present

## 2018-05-29 DIAGNOSIS — I5042 Chronic combined systolic (congestive) and diastolic (congestive) heart failure: Secondary | ICD-10-CM | POA: Diagnosis present

## 2018-05-29 DIAGNOSIS — J9621 Acute and chronic respiratory failure with hypoxia: Secondary | ICD-10-CM | POA: Diagnosis present

## 2018-05-29 DIAGNOSIS — Z8249 Family history of ischemic heart disease and other diseases of the circulatory system: Secondary | ICD-10-CM

## 2018-05-29 DIAGNOSIS — Z823 Family history of stroke: Secondary | ICD-10-CM

## 2018-05-29 DIAGNOSIS — E1165 Type 2 diabetes mellitus with hyperglycemia: Secondary | ICD-10-CM | POA: Diagnosis present

## 2018-05-29 DIAGNOSIS — Z888 Allergy status to other drugs, medicaments and biological substances status: Secondary | ICD-10-CM

## 2018-05-29 DIAGNOSIS — R32 Unspecified urinary incontinence: Secondary | ICD-10-CM | POA: Diagnosis present

## 2018-05-29 DIAGNOSIS — D509 Iron deficiency anemia, unspecified: Secondary | ICD-10-CM | POA: Diagnosis present

## 2018-05-29 DIAGNOSIS — I252 Old myocardial infarction: Secondary | ICD-10-CM

## 2018-05-29 DIAGNOSIS — D649 Anemia, unspecified: Secondary | ICD-10-CM | POA: Diagnosis not present

## 2018-05-29 DIAGNOSIS — I5082 Biventricular heart failure: Secondary | ICD-10-CM | POA: Diagnosis present

## 2018-05-29 DIAGNOSIS — E1122 Type 2 diabetes mellitus with diabetic chronic kidney disease: Secondary | ICD-10-CM | POA: Diagnosis present

## 2018-05-29 DIAGNOSIS — I509 Heart failure, unspecified: Secondary | ICD-10-CM

## 2018-05-29 DIAGNOSIS — I6340 Cerebral infarction due to embolism of unspecified cerebral artery: Secondary | ICD-10-CM

## 2018-05-29 DIAGNOSIS — J690 Pneumonitis due to inhalation of food and vomit: Secondary | ICD-10-CM

## 2018-05-29 DIAGNOSIS — F1410 Cocaine abuse, uncomplicated: Secondary | ICD-10-CM

## 2018-05-29 DIAGNOSIS — L8990 Pressure ulcer of unspecified site, unspecified stage: Secondary | ICD-10-CM

## 2018-05-29 DIAGNOSIS — J9620 Acute and chronic respiratory failure, unspecified whether with hypoxia or hypercapnia: Secondary | ICD-10-CM

## 2018-05-29 DIAGNOSIS — I2510 Atherosclerotic heart disease of native coronary artery without angina pectoris: Secondary | ICD-10-CM | POA: Diagnosis present

## 2018-05-29 DIAGNOSIS — I130 Hypertensive heart and chronic kidney disease with heart failure and stage 1 through stage 4 chronic kidney disease, or unspecified chronic kidney disease: Secondary | ICD-10-CM | POA: Diagnosis present

## 2018-05-29 DIAGNOSIS — J9622 Acute and chronic respiratory failure with hypercapnia: Secondary | ICD-10-CM | POA: Diagnosis present

## 2018-05-29 DIAGNOSIS — Z9119 Patient's noncompliance with other medical treatment and regimen: Secondary | ICD-10-CM

## 2018-05-29 DIAGNOSIS — IMO0001 Reserved for inherently not codable concepts without codable children: Secondary | ICD-10-CM

## 2018-05-29 DIAGNOSIS — Z87891 Personal history of nicotine dependence: Secondary | ICD-10-CM

## 2018-05-29 DIAGNOSIS — Z4659 Encounter for fitting and adjustment of other gastrointestinal appliance and device: Secondary | ICD-10-CM

## 2018-05-29 DIAGNOSIS — Z7951 Long term (current) use of inhaled steroids: Secondary | ICD-10-CM

## 2018-05-29 DIAGNOSIS — N183 Chronic kidney disease, stage 3 (moderate): Secondary | ICD-10-CM | POA: Diagnosis present

## 2018-05-29 DIAGNOSIS — Z794 Long term (current) use of insulin: Secondary | ICD-10-CM

## 2018-05-29 DIAGNOSIS — Z6841 Body Mass Index (BMI) 40.0 and over, adult: Secondary | ICD-10-CM

## 2018-05-29 DIAGNOSIS — Z825 Family history of asthma and other chronic lower respiratory diseases: Secondary | ICD-10-CM

## 2018-05-29 DIAGNOSIS — J449 Chronic obstructive pulmonary disease, unspecified: Secondary | ICD-10-CM | POA: Diagnosis present

## 2018-05-29 DIAGNOSIS — G92 Toxic encephalopathy: Secondary | ICD-10-CM | POA: Diagnosis present

## 2018-05-29 DIAGNOSIS — Z9911 Dependence on respirator [ventilator] status: Secondary | ICD-10-CM

## 2018-05-29 DIAGNOSIS — Z9981 Dependence on supplemental oxygen: Secondary | ICD-10-CM

## 2018-05-29 DIAGNOSIS — Z833 Family history of diabetes mellitus: Secondary | ICD-10-CM

## 2018-05-29 DIAGNOSIS — Z86711 Personal history of pulmonary embolism: Secondary | ICD-10-CM

## 2018-05-29 DIAGNOSIS — I10 Essential (primary) hypertension: Secondary | ICD-10-CM | POA: Diagnosis present

## 2018-05-29 DIAGNOSIS — D72829 Elevated white blood cell count, unspecified: Secondary | ICD-10-CM | POA: Diagnosis present

## 2018-05-29 DIAGNOSIS — E1169 Type 2 diabetes mellitus with other specified complication: Secondary | ICD-10-CM

## 2018-05-29 DIAGNOSIS — R0600 Dyspnea, unspecified: Secondary | ICD-10-CM

## 2018-05-29 DIAGNOSIS — G40409 Other generalized epilepsy and epileptic syndromes, not intractable, without status epilepticus: Secondary | ICD-10-CM | POA: Diagnosis present

## 2018-05-29 DIAGNOSIS — N179 Acute kidney failure, unspecified: Secondary | ICD-10-CM | POA: Diagnosis present

## 2018-05-29 HISTORY — DX: Other pulmonary embolism without acute cor pulmonale: I26.99

## 2018-05-29 HISTORY — DX: Chronic combined systolic (congestive) and diastolic (congestive) heart failure: I50.42

## 2018-05-29 HISTORY — DX: Iron deficiency anemia, unspecified: D50.9

## 2018-05-29 HISTORY — DX: Dyspnea, unspecified: R06.00

## 2018-05-29 HISTORY — DX: Other forms of dyspnea: R06.09

## 2018-05-29 HISTORY — DX: Localized edema: R60.0

## 2018-05-29 HISTORY — DX: Atherosclerotic heart disease of native coronary artery without angina pectoris: I25.10

## 2018-05-29 LAB — CBC WITH DIFFERENTIAL/PLATELET
Abs Immature Granulocytes: 0.04 10*3/uL (ref 0.00–0.07)
Basophils Absolute: 0.1 10*3/uL (ref 0.0–0.1)
Basophils Relative: 1 %
EOS ABS: 0.1 10*3/uL (ref 0.0–0.5)
Eosinophils Relative: 1 %
HCT: 24.2 % — ABNORMAL LOW (ref 36.0–46.0)
Hemoglobin: 5.7 g/dL — CL (ref 12.0–15.0)
Immature Granulocytes: 1 %
Lymphocytes Relative: 20 %
Lymphs Abs: 1.7 10*3/uL (ref 0.7–4.0)
MCH: 17.5 pg — ABNORMAL LOW (ref 26.0–34.0)
MCHC: 23.6 g/dL — ABNORMAL LOW (ref 30.0–36.0)
MCV: 74.5 fL — ABNORMAL LOW (ref 80.0–100.0)
Monocytes Absolute: 1 10*3/uL (ref 0.1–1.0)
Monocytes Relative: 13 %
Neutro Abs: 5.5 10*3/uL (ref 1.7–7.7)
Neutrophils Relative %: 64 %
Platelets: 495 10*3/uL — ABNORMAL HIGH (ref 150–400)
RBC: 3.25 MIL/uL — AB (ref 3.87–5.11)
RDW: 26 % — ABNORMAL HIGH (ref 11.5–15.5)
WBC: 8.3 10*3/uL (ref 4.0–10.5)
nRBC: 1.8 % — ABNORMAL HIGH (ref 0.0–0.2)

## 2018-05-29 LAB — URINALYSIS, COMPLETE (UACMP) WITH MICROSCOPIC
Bilirubin Urine: NEGATIVE
Glucose, UA: NEGATIVE mg/dL
Ketones, ur: 5 mg/dL — AB
Leukocytes, UA: NEGATIVE
Nitrite: NEGATIVE
Protein, ur: 100 mg/dL — AB
Specific Gravity, Urine: 1.025 (ref 1.005–1.030)
pH: 5 (ref 5.0–8.0)

## 2018-05-29 LAB — RETICULOCYTES
Immature Retic Fract: 42.9 % — ABNORMAL HIGH (ref 2.3–15.9)
RBC.: 3.25 MIL/uL — ABNORMAL LOW (ref 3.87–5.11)
Retic Count, Absolute: 38.4 10*3/uL (ref 19.0–186.0)
Retic Ct Pct: 1.2 % (ref 0.4–3.1)

## 2018-05-29 LAB — COMPREHENSIVE METABOLIC PANEL
ALT: 13 U/L (ref 0–44)
AST: 21 U/L (ref 15–41)
Albumin: 2.8 g/dL — ABNORMAL LOW (ref 3.5–5.0)
Alkaline Phosphatase: 102 U/L (ref 38–126)
Anion gap: 6 (ref 5–15)
BUN: 40 mg/dL — ABNORMAL HIGH (ref 6–20)
CO2: 35 mmol/L — ABNORMAL HIGH (ref 22–32)
Calcium: 8.3 mg/dL — ABNORMAL LOW (ref 8.9–10.3)
Chloride: 96 mmol/L — ABNORMAL LOW (ref 98–111)
Creatinine, Ser: 1.78 mg/dL — ABNORMAL HIGH (ref 0.44–1.00)
GFR calc Af Amer: 36 mL/min — ABNORMAL LOW (ref >60)
GFR calc non Af Amer: 31 mL/min — ABNORMAL LOW (ref >60)
Glucose, Bld: 231 mg/dL — ABNORMAL HIGH (ref 70–99)
Potassium: 4.7 mmol/L (ref 3.5–5.1)
Sodium: 137 mmol/L (ref 135–145)
Total Bilirubin: 0.7 mg/dL (ref 0.3–1.2)
Total Protein: 8.3 g/dL — ABNORMAL HIGH (ref 6.5–8.1)

## 2018-05-29 LAB — FOLATE: Folate: 21.5 ng/mL (ref >5.9)

## 2018-05-29 LAB — CREATININE, URINE, RANDOM: CREATININE, URINE: 282.47 mg/dL

## 2018-05-29 LAB — SODIUM, URINE, RANDOM: Sodium, Ur: 51 mmol/L

## 2018-05-29 LAB — GLUCOSE, CAPILLARY: Glucose-Capillary: 179 mg/dL — ABNORMAL HIGH (ref 70–99)

## 2018-05-29 LAB — BRAIN NATRIURETIC PEPTIDE: B Natriuretic Peptide: 1218.8 pg/mL — ABNORMAL HIGH (ref 0.0–100.0)

## 2018-05-29 LAB — POC OCCULT BLOOD, ED: FECAL OCCULT BLD: NEGATIVE

## 2018-05-29 LAB — PREPARE RBC (CROSSMATCH)

## 2018-05-29 LAB — IRON AND TIBC
Iron: 15 ug/dL — ABNORMAL LOW (ref 28–170)
Saturation Ratios: 3 % — ABNORMAL LOW (ref 10.4–31.8)
TIBC: 471 ug/dL — ABNORMAL HIGH (ref 250–450)
UIBC: 456 ug/dL

## 2018-05-29 LAB — TROPONIN I: Troponin I: <0.03 ng/mL (ref <0.03)

## 2018-05-29 LAB — FERRITIN: Ferritin: 9 ng/mL — ABNORMAL LOW (ref 11–307)

## 2018-05-29 LAB — VITAMIN B12: Vitamin B-12: 981 pg/mL — ABNORMAL HIGH (ref 180–914)

## 2018-05-29 MED ORDER — PANTOPRAZOLE SODIUM 40 MG PO TBEC: 40.0000 mg | DELAYED_RELEASE_TABLET | Freq: Every day | ORAL | Status: DC

## 2018-05-29 MED ORDER — CARVEDILOL 12.5 MG PO TABS
12.5000 mg | ORAL_TABLET | Freq: Two times a day (BID) | ORAL | Status: DC
  Filled 2018-05-29: qty 1

## 2018-05-29 MED ORDER — FUROSEMIDE 10 MG/ML IJ SOLN
40.0000 mg | Freq: Once | INTRAMUSCULAR | Status: AC
  Administered 2018-05-29: 40 mg via INTRAVENOUS

## 2018-05-29 MED ORDER — INSULIN DETEMIR 100 UNIT/ML ~~LOC~~ SOLN
15.0000 [IU] | Freq: Every day | SUBCUTANEOUS | Status: DC
  Administered 2018-05-29: 15 [IU] via SUBCUTANEOUS
  Filled 2018-05-29: qty 0.15

## 2018-05-29 MED ORDER — PRASUGREL HCL 10 MG PO TABS: 10.0000 mg | ORAL_TABLET | Freq: Every day | ORAL | Status: DC

## 2018-05-29 MED ORDER — FUROSEMIDE 10 MG/ML IJ SOLN
20.0000 mg | Freq: Once | INTRAMUSCULAR | Status: AC
  Administered 2018-05-29: 20 mg via INTRAVENOUS
  Filled 2018-05-29: qty 2

## 2018-05-29 MED ORDER — INSULIN ASPART 100 UNIT/ML ~~LOC~~ SOLN: 0.0000 [IU] | Freq: Three times a day (TID) | SUBCUTANEOUS | Status: DC

## 2018-05-29 MED ORDER — PANTOPRAZOLE SODIUM 40 MG IV SOLR
40.0000 mg | Freq: Two times a day (BID) | INTRAVENOUS | Status: DC
  Administered 2018-05-30 – 2018-05-31 (×5): 40 mg via INTRAVENOUS
  Filled 2018-05-29 (×5): qty 40

## 2018-05-29 MED ORDER — ROSUVASTATIN CALCIUM 10 MG PO TABS: 10.0000 mg | ORAL_TABLET | Freq: Every day | ORAL | Status: DC

## 2018-05-29 MED ORDER — FUROSEMIDE 40 MG PO TABS: 60.0000 mg | ORAL_TABLET | Freq: Two times a day (BID) | ORAL | Status: DC

## 2018-05-29 MED ORDER — SODIUM CHLORIDE 0.9% IV SOLUTION
Freq: Once | INTRAVENOUS | Status: AC
  Administered 2018-05-29: 15:00:00 via INTRAVENOUS

## 2018-05-29 MED ORDER — ONDANSETRON HCL 4 MG/2ML IJ SOLN
4.0000 mg | Freq: Four times a day (QID) | INTRAMUSCULAR | Status: DC | PRN
  Administered 2018-05-29: 4 mg via INTRAVENOUS
  Filled 2018-05-29: qty 2

## 2018-05-29 MED ORDER — FUROSEMIDE 10 MG/ML IJ SOLN
INTRAMUSCULAR | Status: AC
  Filled 2018-05-29: qty 4

## 2018-05-29 MED ORDER — ASPIRIN EC 81 MG PO TBEC: 81.0000 mg | DELAYED_RELEASE_TABLET | Freq: Every day | ORAL | Status: DC

## 2018-05-29 MED ORDER — ALBUTEROL SULFATE (2.5 MG/3ML) 0.083% IN NEBU
2.5000 mg | INHALATION_SOLUTION | RESPIRATORY_TRACT | Status: DC | PRN
  Administered 2018-05-29 – 2018-06-03 (×2): 2.5 mg via RESPIRATORY_TRACT
  Filled 2018-05-29 (×2): qty 3

## 2018-05-29 MED ORDER — ONDANSETRON HCL 4 MG PO TABS: 4.0000 mg | ORAL_TABLET | Freq: Four times a day (QID) | ORAL | Status: DC | PRN

## 2018-05-29 MED ORDER — INSULIN ASPART 100 UNIT/ML ~~LOC~~ SOLN
0.0000 [IU] | SUBCUTANEOUS | Status: DC
  Administered 2018-05-30: 3 [IU] via SUBCUTANEOUS

## 2018-05-29 NOTE — ED Notes (Addendum)
Pt refused to turn on her back so a EKG can be done and for her to be change due to her voiding on her self. Informed the nurse

## 2018-05-29 NOTE — ED Notes (Signed)
Pt refusing to lay on back for head CT, chest x-ray, and EKG.  And refuses to let us help change her bed, and help her up in bed to breathe better

## 2018-05-29 NOTE — ED Notes (Signed)
Pt unwilling to have CT scan or Xray. Pt states "I just can't do it". Pt states she "loses her breath" when she lays on her back.

## 2018-05-29 NOTE — ED Triage Notes (Signed)
Per EMS: Pt states she "woke up this morning and doesn't feel right." pt denied pain and dizziness.  Pt would no answer any other questions, and was irritated when asked more questions.

## 2018-05-29 NOTE — Progress Notes (Signed)
Transferred to icu secondary to resp distress/hemoptosis at 2300

## 2018-05-29 NOTE — H&P (Signed)
History and Physical    PLEASE NOTE THAT DRAGON DICTATION SOFTWARE WAS USED IN THE CONSTRUCTION OF THIS NOTE.   Tamara Silva RCV:[id number] DOB: February 05, 1961 DOA: 05/29/2018  PCP: Antony Blackbird, MD Patient coming from: home  I have personally briefly reviewed patient's old medical records in [place]  Chief Complaint: generalized weakness.  HPI: Tamara Silva is a 57 y.o. female with medical history significant for chronic iron deficiency anemia with baseline hemoglobin 8-9, type 2 diabetes mellitus, chronic combined systolic/diastolic heart failure, stage III chronic kidney disease with baseline creatinine of 1.2, who is admitted for observation to [facility] long hospital on 05/29/2018 with acute on chronic iron deficiency anemia after presenting from home to [facility] long emergency department complaining of generalized weakness.   The patient reports 1 to 2 days of generalized weakness associated with lightheadedness in the absence of any associated chest pain, shortness of breath, palpitations, presyncope, or syncope.  She also denies any recent nausea, vomiting, abdominal pain, melena, hematochezia, or hematuria.  She acknowledges that she has prescribed oral iron supplementation as an outpatient in the context of a history of chronic iron deficiency anemia, but reports that she has recently been off of this medication as she has run out of refills of such.  Denies any recent trauma.  In the context of a history of coronary artery disease status post PCI with placement of multiple drug-eluting stents, most recently in 2015, the patient reports that she is on chronic dual antiplatelet therapy with aspirin and Effient.  She reports that she is otherwise on no blood thinning agents, and denies any use of NSAIDs.  Patient confirms that most recent colonoscopy occurred in 2015.    ED Course: Vital signs in the ED were notable for the following: Temperature 99.4, heart rate 74-78, blood  pressure initially 119/56, with final blood pressure in the ED noted to be 124/56, respiratory rate 19-20, and oxygen saturation 98 to 100% on room air.  Labs performed in the ED were notable for the following: CMP notable for creatinine 1.78 relative to most recent prior creatinine data point of 1.12 on 01/23/2017.  CBC notable for hemoglobin 5.7 relative to most recent prior hemoglobin value of 7.9 on 01/18/2017; platelets 495.  Fecal occult blood negative.  While in the ED, patient was typed and screened for 2 units PRBC, with transfusion of the first unit of such initiated while still in the ED.  Subsequently, she was admitted for observation to med telemetry for additional work-up and management of presenting acute on chronic iron deficiency anemia.   Review of Systems: As per HPI otherwise 10 point review of systems negative.   Past Medical History:  Diagnosis Date  . Asthma   . Diabetes mellitus without complication [facility])     Past Surgical History:  Procedure Laterality Date  . COLONOSCOPY WITH PROPOFOL N/A 08/03/2013   Procedure: COLONOSCOPY WITH PROPOFOL;  Surgeon: Jeryl Columbia, MD;  Location: [facility] ENDOSCOPY;  Service: Endoscopy;  Laterality: N/A;  . CORONARY ANGIOPLASTY WITH STENT PLACEMENT     CAD in 2006 x 2 and 2009 2 more- place din REx in [place] and [place] med  . CORONARY ANGIOPLASTY WITH STENT PLACEMENT  10/07/2013   Xience [place] DES 2.75  mm x 15  mm  . ESOPHAGOGASTRODUODENOSCOPY (EGD) WITH PROPOFOL N/A 08/03/2013   Procedure: ESOPHAGOGASTRODUODENOSCOPY (EGD) WITH PROPOFOL;  Surgeon: Jeryl Columbia, MD;  Location: [facility] ENDOSCOPY;  Service: Endoscopy;  Laterality: N/A;  . LEFT HEART CATHETERIZATION WITH  CORONARY ANGIOGRAM N/A 10/07/2013   Procedure: LEFT HEART CATHETERIZATION WITH CORONARY ANGIOGRAM;  Surgeon: Leonie Man, MD;  Location: [facility] CATH LAB;  Service: Cardiovascular;  Laterality: N/A;    Social History:  reports that she quit smoking about 4 years ago. Her smoking use  included cigarettes. She has a 10.00 pack-year smoking history. She has never used smokeless tobacco. She reports that she does not drink alcohol or use drugs.   Allergies  Allergen Reactions  . Metolazone Other (See Comments)    Dizziness and falling  . Plavix [Clopidogrel Bisulfate] Other (See Comments)    High PRU's - non-responder  . Ibuprofen     Patient stated she can not take ibuprofen.   . Nsaids Other (See Comments)    "I do not take NSAIDs d/t interfering with other meds"    Family History  Problem Relation Age of Onset  . Stroke Mother   . Hypertension Mother   . Colon cancer Father   . Diabetes Father   . Heart attack Father   . Sarcoidosis Other   . Breast cancer Paternal Aunt   . Emphysema Brother   . Lung disease Brother        Unknown type, 3 brothers, one with liver and lung disease  . Asthma Paternal Aunt      Prior to Admission medications   Medication Sig Start Date End Date Taking? Authorizing Provider  ACCU-CHEK AVIVA PLUS test strip USE 1 STRIP TO CHECK GLUCOSE 3 TIMES DAILY 03/30/18  Yes Fulp, Cammie, MD  ACCU-CHEK FASTCLIX LANCETS MISC 1 applicator by Does not apply route 4 (four) times daily -  before meals and at bedtime. 08/07/15  Yes Charlott Rakes, MD  ACCU-CHEK SOFTCLIX LANCETS lancets USE 1 LANCET TO CHECK GLUCOSE 3 TIMES DAILY 01/14/16  Yes Tresa Garter, MD  aspirin 81 MG tablet Take 1 tablet (81 mg total) by mouth daily. 10/09/13  Yes Barrett, Evelene Croon, PA-C  Blood Glucose Monitoring Suppl (ACCU-CHEK AVIVA) device Use as instructed 3 times daily before meals. 08/06/15  Yes Newlin, Charlane Ferretti, MD  carvedilol (COREG) 12.5 MG tablet TAKE 1 TABLET BY MOUTH 2 TIMES A DAY WITH A MEAL **PT NEEDS TO SCHEDULE AN APPT WITH OUR OFFICE AT [phone] FOR FUTURE REFILLS 04/25/18  Yes Larey Dresser, MD  fluticasone (FLONASE) 50 MCG/ACT nasal spray INSTILL 2 SPRAYS IN EACH NOSTRIL TWICE DAILY. 04/08/17  Yes Tresa Garter, MD  furosemide (LASIX) 40  MG tablet Take 1.5 tablets (60 mg total) by mouth 2 (two) times daily. 02/24/18 02/24/19 Yes Fulp, Cammie, MD  Insulin Pen Needle (TRUEPLUS PEN NEEDLES) 32G X 4 MM MISC Use three times daily to inject insulin 02/02/18  Yes Fulp, Cammie, MD  INSULIN SYRINGE .5CC/29G 29G X 1/2" 0.5 ML MISC Use as instructed by physician 01/27/18  Yes Fulp, Cammie, MD  ipratropium-albuterol (DUONEB) 0.5-2.5 (3) MG/3ML SOLN Use nebulizer to inhale contents of one vial every 6 hours as needed for shortness of breath, wheezing 01/27/18  Yes Fulp, Cammie, MD  LEVEMIR 100 UNIT/ML injection INJECT 45 UNITS SUBCUTANEOUSLY EVERY NIGHT AT BEDTIME *MUST HAVE OFFICE VISIT FOR REFILLS* 02/24/18  Yes Fulp, Cammie, MD  losartan (COZAAR) 50 MG tablet Take 1 tablet (50 mg total) by mouth daily. 01/27/18  Yes Fulp, Cammie, MD  nitroGLYCERIN (NITROSTAT) 0.4 MG SL tablet Place 1 tablet (0.4 mg total) under the tongue every 5 (five) minutes as needed for chest pain. 01/27/18  Yes Fulp, Ander Gaster, MD  NOVOLOG  FLEXPEN 100 UNIT/ML FlexPen INJECT 15-20 UNITS SUBCUTANEOUSLY 3 TIMES DAILY WITH MEALS. **PATIENT MUST HAVE OFFICE VISIT FOR FUTURE REFILLS** 02/24/18  Yes Fulp, Cammie, MD  omeprazole (PRILOSEC) 40 MG capsule TAKE 1 CAPSULE BY MOUTH TWICE DAILY. 04/08/17  Yes Tresa Garter, MD  polyethylene glycol (MIRALAX / GLYCOLAX) packet Take 17 g by mouth daily. 01/27/18  Yes Fulp, Cammie, MD  prasugrel (EFFIENT) 10 MG TABS tablet TAKE ONE TABLET BY MOUTH DAILY. 05/29/18  Yes Bensimhon, Shaune Pascal, MD  rosuvastatin (CRESTOR) 10 MG tablet Take 1 tablet (10 mg total) by mouth daily. 01/27/18  Yes Fulp, Cammie, MD  spironolactone (ALDACTONE) 25 MG tablet Take 1 tablet (25 mg total) by mouth daily. 01/27/18  Yes Fulp, Cammie, MD  iron polysaccharides (NIFEREX) 150 MG capsule Take 1 capsule (150 mg total) by mouth daily. Patient not taking: Reported on 05/29/2018 01/27/18   Fulp, Cammie, MD  prazosin (MINIPRESS) 5 MG capsule TAKE 1 CAPSULE BY MOUTH EVERY NIGHT AT  BEDTIME FOR NIGHTMARES. Patient not taking: Reported on 05/29/2018 03/30/18   Fulp, Ander Gaster, MD  PROAIR HFA 108 (90 Base) MCG/ACT inhaler INHALE 2 PUFFS BY MOUTH 4 TIMES DAILY AS NEEDED Patient not taking: Reported on 05/29/2018 02/24/18   Fulp, Ander Gaster, MD  senna (SENOKOT) 8.6 MG TABS tablet Take 1 tablet (8.6 mg total) by mouth 2 (two) times daily. Patient not taking: Reported on 05/29/2018 01/18/18   Patrecia Pour, Christean Grief, MD     Objective     Physical Exam: Vitals:   05/29/18 1120 05/29/18 1122 05/29/18 1234 05/29/18 1250  BP: 129/60 129/60 (!) 124/56   Pulse: 74 75 76   Resp:  19 (!) 22   Temp:    (!) 100.9 F (38.3 C)  TempSrc:    Rectal  SpO2: 100% 100% 98%   Weight:      Height:        General: appears to be stated age; alert Skin: warm, dry, no rash Head:  AT/[place] Mouth:  Oral mucosa membranes appear dry, normal dentition Neck: supple; trachea midline Heart:  RRR; did not appreciate any M/R/G Lungs: diminished bibasilar breath sounds, but otherwise CTAB, w/o rales or rhonchi.  Abdomen: + BS; soft, ND, NT Extremities: trace edema in b/l LE's; no muscle wasting   Labs on Admission: I have personally reviewed following labs and imaging studies  CBC: Recent Labs  Lab 05/29/18 1052  WBC 8.3  NEUTROABS 5.5  HGB 5.7*  HCT 24.2*  MCV 74.5*  PLT 314*   Basic Metabolic Panel: Recent Labs  Lab 05/29/18 1052  NA 137  K 4.7  CL 96*  CO2 35*  GLUCOSE 231*  BUN 40*  CREATININE 1.78*  CALCIUM 8.3*   GFR: Estimated Creatinine Clearance: 52.1 mL/min (A) (by C-G formula based on SCr of 1.78 mg/dL (H)). Liver Function Tests: Recent Labs  Lab 05/29/18 1052  AST 21  ALT 13  ALKPHOS 102  BILITOT 0.7  PROT 8.3*  ALBUMIN 2.8*   No results for input(s): LIPASE, AMYLASE in the last 168 hours. No results for input(s): AMMONIA in the last 168 hours. Coagulation Profile: No results for input(s): INR, PROTIME in the last 168 hours. Cardiac Enzymes: Recent Labs    Lab 05/29/18 1052  TROPONINI <0.03   BNP (last 3 results) No results for input(s): PROBNP in the last 8760 hours. HbA1C: No results for input(s): HGBA1C in the last 72 hours. CBG: No results for input(s): GLUCAP in the last 168 hours. Lipid  Profile: No results for input(s): CHOL, HDL, LDLCALC, TRIG, CHOLHDL, LDLDIRECT in the last 72 hours. Thyroid Function Tests: No results for input(s): TSH, T4TOTAL, FREET4, T3FREE, THYROIDAB in the last 72 hours. Anemia Panel: Recent Labs    05/29/18 1237  VITAMINB12 981*  FOLATE 21.5  FERRITIN 9*  TIBC 471*  IRON 15*  RETICCTPCT 1.2   Urine analysis:    Component Value Date/Time   COLORURINE YELLOW 01/13/2018 1635   APPEARANCEUR HAZY (A) 01/13/2018 1635   LABSPEC 1.021 01/13/2018 1635   PHURINE 5.0 01/13/2018 1635   GLUCOSEU >=500 (A) 01/13/2018 1635   HGBUR NEGATIVE 01/13/2018 1635   BILIRUBINUR NEGATIVE 01/13/2018 1635   KETONESUR NEGATIVE 01/13/2018 1635   PROTEINUR 100 (A) 01/13/2018 1635   UROBILINOGEN 2.0 (H) 02/21/2015 1914   NITRITE NEGATIVE 01/13/2018 1635   LEUKOCYTESUR NEGATIVE 01/13/2018 1635    Radiological Exams on Admission: No results found.    Assessment/Plan   Tamara Silva is a 57 y.o. female with medical history significant for chronic iron deficiency anemia with baseline hemoglobin 8-9, type 2 diabetes mellitus, chronic combined systolic/diastolic heart failure, stage III chronic kidney disease with baseline creatinine of 1.2, who is admitted for observation to [facility] long hospital on 05/29/2018 with acute on chronic iron deficiency anemia after presenting from home to [facility] long emergency department complaining of generalized weakness.    Principal Problem:   Acute on chronic anemia Active Problems:   Essential hypertension   Chronic combined systolic and diastolic heart failure ([place])   Uncontrolled type 2 diabetes mellitus without complication, with long-term current use of insulin (HCC)   AKI  (acute kidney injury) ([place])   #) Acute on chronic iron deficiency anemia: Relative to baseline hemoglobin of 8-9 a/w microcytic and hypochromic findings, the patient presents with hemoglobin slightly lower than baseline, with presenting hemoglobin noted to be 5.7, once again microcytic and hypochromic in nature.  No overt evidence of an active bleed, as the patient denies any recent melena or hematochezia.  Additionally fecal occult stool was found to be negative.  The patient acknowledges her history of chronic iron deficiency anemia and reports that she independently stopped taking her chronic oral iron supplementation, stating that she ran out of refills for this.  In spite of a hemoglobin value of 5.7, the patient is largely asymptomatic, reporting only mild generalized weakness as well as lightheadedness in the absence of any additional symptoms including no chest pain, shortness of breath presyncope, or syncope.  She also appears hemodynamically stable per ongoing review of vital signs.  Her lack of significant symptoms and presence of hemodynamic stability further reinforce that presenting hemoglobin of 5.7 is likely not far from baseline and may have occurred over subacute timeframe.  While there appears to be a significant component of iron deficiency contributing to presenting anemia, there may also be a additional contribution from anemia of chronic kidney disease given interval worsening of renal function.  Given all the above, there does not appear to be an overt indication for pursuing colonoscopy on an inpatient basis, although additional consideration could be given for this to occur as an outpatient.  Plan: We will transfuse 2 units PRBC, with each unit to be transfused slowly over 3 hours given patient's history of chronic combined systolic and diastolic heart failure with most recent echocardiogram showing LVEF 35 to 40%.  Additionally, I have ordered 40 mg of IV Lasix to be administered  following completion of each unit of transfused PRBC.  Closely monitor  strict I's and O's.  Monitor on telemetry.  Repeat hemoglobin at 2200 this evening.  We will also recheck CBC in the morning.  Check iron studies and to reticulocyte count.    #) Acute kidney injury: Relative to a history of stage III chronic kidney disease with baseline creatinine of 1.0, presenting labs reflect creatinine of 1.78.  Suspect prerenal etiology, including potential decline in oxygen carrying capacity in the setting of acute on chronic anemia, as further described above.  Plan: Work-up and management of acute on chronic iron deficiency anemia, as further described above.  Hold home losartan.  Monitor strict I's and O's.  Check urinalysis, including evaluating for the presence of a urinary cast.  Check random urine sodium as well as random urine creatinine.  Repeat BMP in the morning.    #) Type 2 diabetes mellitus: The patient reports that she is on Levemir 45 units subcu nightly as an outpatient as well as NovoLog 15 to 20 units 3 times daily with meals.  Presenting blood sugar per presenting CMP noted to be 231.  Plan: I have ordered Levemir 15 units subcu nightly as well as a diabetic diet with Accu-Cheks q. before meals and at bedtime and sliding scale insulin.    #) Chronic combined systolic/diastolic heart failure: Most recent echocardiogram in August 2019 demonstrated a mildly dilated left ventricle, mild LVH, LVEF 35 to 16%, grade 2 diastolic dysfunction, as well as moderately reduced right ventricular systolic function.  These findings were felt to be unchanged from most recent prior echo that was performed in April 2018.  No clinical evidence at this time to suggest acutely decompensated heart failure.  Outpatient heart failure regimen includes Coreg, spironolactone, and losartan.  She is also on Lasix 60 mg p.o. twice daily.  Plan: Holding home losartan for now in the setting of presenting acute kidney  injury.  Resume home Coreg.  Continue home Lasix, which will be resumed tomorrow following aforementioned two doses of IV Lasix that have been ordered to occur today following each unit of transfused PRBC.  Monitor strict I's and O's.    DVT prophylaxis: scd's Code Status: full Family Communication: (none) Disposition Plan:  Per Rounding Team Consults called: (none)  Admission status: observation; med-tele.    PLEASE NOTE THAT DRAGON DICTATION SOFTWARE WAS USED IN THE CONSTRUCTION OF THIS NOTE.   [facility] Pager [phone] From 7 AM - 7 PM.  Otherwise, please contact night-coverage  [email or web address] Password [email or web address]  05/29/2018, 2:14 PM

## 2018-05-29 NOTE — ED Notes (Signed)
ED TO INPATIENT HANDOFF REPORT  Name/Age/Gender Tamara Silva 57 y.o. female  Code Status    Code Status Orders  (From admission, onward)         Start     Ordered   05/29/18 1512  Full code  Continuous     05/29/18 1514        Code Status History    Date Active Date Inactive Code Status Order ID Comments User Context   01/13/2018 1957 01/18/2018 1933 Full Code [id number]  Dessa Phi, DO Inpatient   06/21/2015 0304 06/29/2015 1641 Full Code [id number]  Edwin Dada, MD Inpatient   05/21/2015 0045 05/26/2015 1440 Full Code [id number]  Edwin Dada, MD Inpatient   03/04/2014 2146 03/08/2014 1900 Full Code [id number]  Shanda Howells, MD ED   02/06/2014 1808 02/08/2014 2232 Full Code [id number]  Debbe Odea, MD ED   10/07/2013 0213 10/09/2013 1802 Full Code [id number]  Leonie Man, MD Inpatient   07/29/2013 0136 08/04/2013 1821 Full Code [id number]  Toy Baker, MD Inpatient   07/10/2013 0642 07/16/2013 1639 Full Code [id number]  Berle Mull, MD Inpatient   07/01/2013 0423 07/05/2013 1733 Full Code [id number]  Theressa Millard, MD ED   11/27/2012 0459 11/28/2012 1556 Full Code [id number]  Etta Quill., DO ED      Home/SNF/Other Home  Chief Complaint general malaise  Level of Care/Admitting Diagnosis ED Disposition    ED Disposition Condition [facility] Area: [facility] [[id number]]  Level of Care: Telemetry [5]  Admit to tele based on following criteria: Other see comments  Comments: acute on chronic anemia requiring transfusion  Diagnosis: Acute on chronic anemia [1749449]  Admitting Physician: Rhetta Mura [[id number]]  Attending Physician: Rhetta Mura [[id number]]  PT Class (Do Not Modify): Observation [104]  PT Acc Code (Do Not Modify): Observation [10022]       Medical History Past Medical History:  Diagnosis Date  . Asthma   . Chronic combined systolic (congestive) and diastolic (congestive) heart  failure ([place])   . Chronic iron deficiency anemia   . Diabetes mellitus without complication (HCC)     Allergies Allergies  Allergen Reactions  . Metolazone Other (See Comments)    Dizziness and falling  . Plavix [Clopidogrel Bisulfate] Other (See Comments)    High PRU's - non-responder  . Ibuprofen     Patient stated she can not take ibuprofen.   . Nsaids Other (See Comments)    "I do not take NSAIDs d/t interfering with other meds"    IV Location/Drains/Wounds Patient Lines/Drains/Airways Status   Active Line/Drains/Airways    Name:   Placement date:   Placement time:   Site:   Days:   Peripheral IV 05/29/18 Left Antecubital   05/29/18    1056    Antecubital   less than 1          Labs/Imaging Results for orders placed or performed during the hospital encounter of 05/29/18 (from the past 48 hour(s))  Comprehensive metabolic panel     Status: Abnormal   Collection Time: 05/29/18 10:52 AM  Result Value Ref Range   Sodium 137 135 - 145 mmol/L   Potassium 4.7 3.5 - 5.1 mmol/L   Chloride 96 (L) 98 - 111 mmol/L   CO2 35 (H) 22 - 32 mmol/L   Glucose, Bld 231 (H) 70 - 99 mg/dL   BUN 40 (H) 6 - 20 mg/dL   Creatinine, Ser 1.78 (H)  0.44 - 1.00 mg/dL   Calcium 8.3 (L) 8.9 - 10.3 mg/dL   Total Protein 8.3 (H) 6.5 - 8.1 g/dL   Albumin 2.8 (L) 3.5 - 5.0 g/dL   AST 21 15 - 41 U/L   ALT 13 0 - 44 U/L   Alkaline Phosphatase 102 38 - 126 U/L   Total Bilirubin 0.7 0.3 - 1.2 mg/dL   GFR calc non Af Amer 31 (L) >60 mL/min   GFR calc Af Amer 36 (L) >60 mL/min   Anion gap 6 5 - 15    Comment: Performed at [facility], [place] [address], [place], [place] [address]  CBC WITH DIFFERENTIAL     Status: Abnormal   Collection Time: 05/29/18 10:52 AM  Result Value Ref Range   WBC 8.3 4.0 - 10.5 K/uL   RBC 3.25 (L) 3.87 - 5.11 MIL/uL   Hemoglobin 5.7 (LL) 12.0 - 15.0 g/dL    Comment: Reticulocyte Hemoglobin testing may be clinically indicated, consider ordering this  additional test [id number] THIS CRITICAL RESULT HAS VERIFIED AND BEEN CALLED TO MONEY, C RN BY NICOLE MCCOY ON 12 30 2019 AT 1112, AND HAS BEEN READ BACK. CRITICAL RESULT VERIFIED    HCT 24.2 (L) 36.0 - 46.0 %   MCV 74.5 (L) 80.0 - 100.0 fL   MCH 17.5 (L) 26.0 - 34.0 pg   MCHC 23.6 (L) 30.0 - 36.0 g/dL   RDW 26.0 (H) 11.5 - 15.5 %   Platelets 495 (H) 150 - 400 K/uL   nRBC 1.8 (H) 0.0 - 0.2 %   Neutrophils Relative % 64 %   Neutro Abs 5.5 1.7 - 7.7 K/uL   Lymphocytes Relative 20 %   Lymphs Abs 1.7 0.7 - 4.0 K/uL   Monocytes Relative 13 %   Monocytes Absolute 1.0 0.1 - 1.0 K/uL   Eosinophils Relative 1 %   Eosinophils Absolute 0.1 0.0 - 0.5 K/uL   Basophils Relative 1 %   Basophils Absolute 0.1 0.0 - 0.1 K/uL   Immature Granulocytes 1 %   Abs Immature Granulocytes 0.04 0.00 - 0.07 K/uL   Polychromasia PRESENT    Target Cells PRESENT     Comment: Performed at [facility], [place] [address], [place], [place] [address]  Troponin I - ONCE - STAT     Status: None   Collection Time: 05/29/18 10:52 AM  Result Value Ref Range   Troponin I <0.03 <0.03 ng/mL    Comment: Performed at [facility], [place] [address], [place], [place] [address]  Brain natriuretic peptide     Status: Abnormal   Collection Time: 05/29/18 10:52 AM  Result Value Ref Range   B Natriuretic Peptide 1,218.8 (H) 0.0 - 100.0 pg/mL    Comment: Performed at [facility], [place] [address] [place], [place] [address]  Vitamin B12     Status: Abnormal   Collection Time: 05/29/18 12:37 PM  Result Value Ref Range   Vitamin B-12 981 (H) 180 - 914 pg/mL    Comment: (NOTE) This assay is not validated for testing neonatal or myeloproliferative syndrome specimens for Vitamin B12 levels. Performed at [facility], [place] [address], [place], [place] [address]   Folate     Status: None   Collection Time: 05/29/18 12:37 PM  Result Value Ref Range   Folate 21.5  >5.9 ng/mL    Comment: Performed at [facility], [place] [address], [place], [place] [address]  Iron and TIBC     Status: Abnormal   Collection Time: 05/29/18 12:37 PM  Result Value Ref Range   Iron 15 (L) 28 - 170 ug/dL   TIBC 471 (H) 250 - 450 ug/dL   Saturation Ratios 3 (L) 10.4 - 31.8 %   UIBC 456 ug/dL    Comment: Performed at [facility], [place] [address] [place], [place] [address]  Ferritin     Status: Abnormal   Collection Time: 05/29/18 12:37 PM  Result Value Ref Range   Ferritin 9 (L) 11 - 307 ng/mL    Comment: Performed at [facility], [place] [address], [place], [place] [address]  Reticulocytes     Status: Abnormal   Collection Time: 05/29/18 12:37 PM  Result Value Ref Range   Retic Ct Pct 1.2 0.4 - 3.1 %   RBC. 3.25 (L) 3.87 - 5.11 MIL/uL   Retic Count, Absolute 38.4 19.0 - 186.0 K/uL   Immature Retic Fract 42.9 (H) 2.3 - 15.9 %    Comment: Performed at [facility], [place] [address], [place], [place] [address]  Type and screen     Status: None (Preliminary result)   Collection Time: 05/29/18 12:37 PM  Result Value Ref Range   ABO/RH(D) B POS    Antibody Screen NEG    Sample Expiration 06/01/2018    Unit Number [id number]    Blood Component Type RED CELLS,LR    Unit division 00    Status of Unit ISSUED    Transfusion Status OK TO TRANSFUSE    Crossmatch Result      Compatible Performed at [place] [address], [place], [place] [address]    Unit Number [id number]    Blood Component Type RBC LR PHER2    Unit division 00    Status of Unit ALLOCATED    Transfusion Status OK TO TRANSFUSE    Crossmatch Result Compatible   Prepare RBC     Status: None   Collection Time: 05/29/18 12:37 PM  Result Value Ref Range   Order Confirmation      ORDER PROCESSED BY BLOOD BANK Performed at [facility], [place] [address] [place], [place] [address]   POC  occult blood, ED     Status: None   Collection Time: 05/29/18 12:51 PM  Result Value Ref Range   Fecal Occult Bld NEGATIVE NEGATIVE   No results found.  Pending Labs Unresulted Labs (From admission, onward)    Start     Ordered   05/30/18 0500  TSH  Tomorrow morning,   R     05/29/18 1514   05/30/18 7681  Basic metabolic panel  Tomorrow morning,   R     05/29/18 1514   05/30/18 0500  CBC  Tomorrow morning,   R     05/29/18 1514   05/30/18 0500  Protime-INR  Tomorrow morning,   R     05/29/18 1514   05/30/18 0500  Magnesium  Tomorrow morning,   R     05/29/18 1514   05/29/18 2100  Hemoglobin  Once-Timed,   R     05/29/18 1514   05/29/18 0859  Urinalysis, Complete w Microscopic  ONCE - STAT,   STAT     05/29/18 0900          Vitals/Pain Today's Vitals   05/29/18 1250 05/29/18 1449 05/29/18 1449 05/29/18 1512  BP:   126/62 (!) 133/59  Pulse:   76 78  Resp:  (!) 21  20  Temp: (!) 100.9 F (38.3 C)  97.9 F (36.6 C) 98.6 F (37 C)  TempSrc: Rectal  Oral Oral  SpO2:   95% 100%  Weight:      Height:      PainSc:        Isolation Precautions No active isolations  Medications Medications  aspirin tablet 81 mg (has no administration in time range)  carvedilol (COREG) tablet 12.5 mg (has no administration in time range)  furosemide (LASIX) tablet 60 mg (has no administration in time range)  rosuvastatin (CRESTOR) tablet 10 mg (has no administration in time range)  pantoprazole (PROTONIX) EC tablet 40 mg (has no administration in time range)  prasugrel (EFFIENT) tablet 10 mg (has no administration in time range)  ondansetron (ZOFRAN) tablet 4 mg (has no administration in time range)    Or  ondansetron (ZOFRAN) injection 4 mg (has no administration in time range)  albuterol (PROVENTIL) (2.5 MG/3ML) 0.083% nebulizer solution 2.5 mg (has no administration in time range)  insulin detemir (LEVEMIR) injection 15 Units (has no administration in time range)  0.9 %  sodium  chloride infusion (Manually program via Guardrails IV Fluids) ( Intravenous New Bag/Given 05/29/18 1501)    Mobility walks with device

## 2018-05-30 ENCOUNTER — Other Ambulatory Visit: Payer: Self-pay

## 2018-05-30 ENCOUNTER — Observation Stay (HOSPITAL_COMMUNITY): Payer: Medicaid Other

## 2018-05-30 ENCOUNTER — Encounter (HOSPITAL_COMMUNITY): Payer: Self-pay

## 2018-05-30 ENCOUNTER — Other Ambulatory Visit (HOSPITAL_COMMUNITY): Payer: Self-pay

## 2018-05-30 DIAGNOSIS — J410 Simple chronic bronchitis: Secondary | ICD-10-CM | POA: Diagnosis not present

## 2018-05-30 DIAGNOSIS — I6389 Other cerebral infarction: Secondary | ICD-10-CM | POA: Diagnosis not present

## 2018-05-30 DIAGNOSIS — R0902 Hypoxemia: Secondary | ICD-10-CM | POA: Diagnosis not present

## 2018-05-30 DIAGNOSIS — J9622 Acute and chronic respiratory failure with hypercapnia: Secondary | ICD-10-CM

## 2018-05-30 DIAGNOSIS — I5042 Chronic combined systolic (congestive) and diastolic (congestive) heart failure: Secondary | ICD-10-CM | POA: Diagnosis not present

## 2018-05-30 DIAGNOSIS — G40409 Other generalized epilepsy and epileptic syndromes, not intractable, without status epilepticus: Secondary | ICD-10-CM | POA: Diagnosis not present

## 2018-05-30 DIAGNOSIS — J9621 Acute and chronic respiratory failure with hypoxia: Secondary | ICD-10-CM

## 2018-05-30 DIAGNOSIS — I10 Essential (primary) hypertension: Secondary | ICD-10-CM | POA: Diagnosis not present

## 2018-05-30 DIAGNOSIS — R062 Wheezing: Secondary | ICD-10-CM | POA: Diagnosis not present

## 2018-05-30 DIAGNOSIS — R0989 Other specified symptoms and signs involving the circulatory and respiratory systems: Secondary | ICD-10-CM | POA: Diagnosis not present

## 2018-05-30 DIAGNOSIS — J9612 Chronic respiratory failure with hypercapnia: Secondary | ICD-10-CM | POA: Diagnosis not present

## 2018-05-30 DIAGNOSIS — Z794 Long term (current) use of insulin: Secondary | ICD-10-CM

## 2018-05-30 DIAGNOSIS — R159 Full incontinence of feces: Secondary | ICD-10-CM | POA: Diagnosis present

## 2018-05-30 DIAGNOSIS — E1165 Type 2 diabetes mellitus with hyperglycemia: Secondary | ICD-10-CM

## 2018-05-30 DIAGNOSIS — N179 Acute kidney failure, unspecified: Secondary | ICD-10-CM | POA: Diagnosis present

## 2018-05-30 DIAGNOSIS — J449 Chronic obstructive pulmonary disease, unspecified: Secondary | ICD-10-CM | POA: Diagnosis present

## 2018-05-30 DIAGNOSIS — I5041 Acute combined systolic (congestive) and diastolic (congestive) heart failure: Secondary | ICD-10-CM | POA: Diagnosis not present

## 2018-05-30 DIAGNOSIS — R32 Unspecified urinary incontinence: Secondary | ICD-10-CM | POA: Diagnosis present

## 2018-05-30 DIAGNOSIS — F1410 Cocaine abuse, uncomplicated: Secondary | ICD-10-CM | POA: Diagnosis present

## 2018-05-30 DIAGNOSIS — G92 Toxic encephalopathy: Secondary | ICD-10-CM | POA: Diagnosis present

## 2018-05-30 DIAGNOSIS — I130 Hypertensive heart and chronic kidney disease with heart failure and stage 1 through stage 4 chronic kidney disease, or unspecified chronic kidney disease: Secondary | ICD-10-CM | POA: Diagnosis present

## 2018-05-30 DIAGNOSIS — R569 Unspecified convulsions: Secondary | ICD-10-CM | POA: Diagnosis not present

## 2018-05-30 DIAGNOSIS — I5082 Biventricular heart failure: Secondary | ICD-10-CM | POA: Diagnosis present

## 2018-05-30 DIAGNOSIS — R531 Weakness: Secondary | ICD-10-CM | POA: Diagnosis present

## 2018-05-30 DIAGNOSIS — I2510 Atherosclerotic heart disease of native coronary artery without angina pectoris: Secondary | ICD-10-CM | POA: Diagnosis present

## 2018-05-30 DIAGNOSIS — E7220 Disorder of urea cycle metabolism, unspecified: Secondary | ICD-10-CM | POA: Diagnosis not present

## 2018-05-30 DIAGNOSIS — I5033 Acute on chronic diastolic (congestive) heart failure: Secondary | ICD-10-CM | POA: Diagnosis not present

## 2018-05-30 DIAGNOSIS — Z6841 Body Mass Index (BMI) 40.0 and over, adult: Secondary | ICD-10-CM | POA: Diagnosis not present

## 2018-05-30 DIAGNOSIS — N183 Chronic kidney disease, stage 3 (moderate): Secondary | ICD-10-CM | POA: Diagnosis present

## 2018-05-30 DIAGNOSIS — J441 Chronic obstructive pulmonary disease with (acute) exacerbation: Secondary | ICD-10-CM | POA: Diagnosis not present

## 2018-05-30 DIAGNOSIS — I5043 Acute on chronic combined systolic (congestive) and diastolic (congestive) heart failure: Secondary | ICD-10-CM

## 2018-05-30 DIAGNOSIS — R471 Dysarthria and anarthria: Secondary | ICD-10-CM | POA: Diagnosis not present

## 2018-05-30 DIAGNOSIS — G4733 Obstructive sleep apnea (adult) (pediatric): Secondary | ICD-10-CM | POA: Diagnosis not present

## 2018-05-30 DIAGNOSIS — I639 Cerebral infarction, unspecified: Secondary | ICD-10-CM | POA: Diagnosis not present

## 2018-05-30 DIAGNOSIS — G9340 Encephalopathy, unspecified: Secondary | ICD-10-CM | POA: Diagnosis not present

## 2018-05-30 DIAGNOSIS — Z9119 Patient's noncompliance with other medical treatment and regimen: Secondary | ICD-10-CM | POA: Diagnosis not present

## 2018-05-30 DIAGNOSIS — L8990 Pressure ulcer of unspecified site, unspecified stage: Secondary | ICD-10-CM

## 2018-05-30 DIAGNOSIS — I63411 Cerebral infarction due to embolism of right middle cerebral artery: Secondary | ICD-10-CM | POA: Diagnosis present

## 2018-05-30 DIAGNOSIS — I5023 Acute on chronic systolic (congestive) heart failure: Secondary | ICD-10-CM | POA: Diagnosis not present

## 2018-05-30 DIAGNOSIS — D509 Iron deficiency anemia, unspecified: Secondary | ICD-10-CM | POA: Diagnosis present

## 2018-05-30 DIAGNOSIS — E872 Acidosis: Secondary | ICD-10-CM | POA: Diagnosis present

## 2018-05-30 DIAGNOSIS — R5381 Other malaise: Secondary | ICD-10-CM | POA: Diagnosis not present

## 2018-05-30 DIAGNOSIS — K5901 Slow transit constipation: Secondary | ICD-10-CM | POA: Diagnosis not present

## 2018-05-30 DIAGNOSIS — E1122 Type 2 diabetes mellitus with diabetic chronic kidney disease: Secondary | ICD-10-CM | POA: Diagnosis present

## 2018-05-30 DIAGNOSIS — J9611 Chronic respiratory failure with hypoxia: Secondary | ICD-10-CM | POA: Diagnosis not present

## 2018-05-30 DIAGNOSIS — J9620 Acute and chronic respiratory failure, unspecified whether with hypoxia or hypercapnia: Secondary | ICD-10-CM | POA: Diagnosis not present

## 2018-05-30 DIAGNOSIS — D649 Anemia, unspecified: Secondary | ICD-10-CM | POA: Diagnosis not present

## 2018-05-30 DIAGNOSIS — E669 Obesity, unspecified: Secondary | ICD-10-CM | POA: Diagnosis not present

## 2018-05-30 DIAGNOSIS — E1169 Type 2 diabetes mellitus with other specified complication: Secondary | ICD-10-CM | POA: Diagnosis not present

## 2018-05-30 DIAGNOSIS — Z9981 Dependence on supplemental oxygen: Secondary | ICD-10-CM | POA: Diagnosis not present

## 2018-05-30 DIAGNOSIS — Z9911 Dependence on respirator [ventilator] status: Secondary | ICD-10-CM | POA: Diagnosis not present

## 2018-05-30 DIAGNOSIS — R0689 Other abnormalities of breathing: Secondary | ICD-10-CM

## 2018-05-30 DIAGNOSIS — E662 Morbid (severe) obesity with alveolar hypoventilation: Secondary | ICD-10-CM | POA: Diagnosis present

## 2018-05-30 LAB — TROPONIN I
Troponin I: <0.03 ng/mL (ref <0.03)
Troponin I: <0.03 ng/mL (ref <0.03)
Troponin I: <0.03 ng/mL (ref <0.03)

## 2018-05-30 LAB — TYPE AND SCREEN
ABO/RH(D): B POS
Antibody Screen: NEGATIVE
Unit division: 0
Unit division: 0

## 2018-05-30 LAB — BPAM RBC
Blood Product Expiration Date: 202001172359
Blood Product Expiration Date: 202001212359
ISSUE DATE / TIME: 201912301437
ISSUE DATE / TIME: 201912301942
Unit Type and Rh: 7300
Unit Type and Rh: 7300

## 2018-05-30 LAB — BASIC METABOLIC PANEL
Anion gap: 8 (ref 5–15)
BUN: 41 mg/dL — ABNORMAL HIGH (ref 6–20)
CO2: 33 mmol/L — ABNORMAL HIGH (ref 22–32)
CREATININE: 1.66 mg/dL — AB (ref 0.44–1.00)
Calcium: 8.2 mg/dL — ABNORMAL LOW (ref 8.9–10.3)
Chloride: 98 mmol/L (ref 98–111)
GFR calc Af Amer: 39 mL/min — ABNORMAL LOW (ref >60)
GFR calc non Af Amer: 34 mL/min — ABNORMAL LOW (ref >60)
Glucose, Bld: 137 mg/dL — ABNORMAL HIGH (ref 70–99)
Potassium: 4.7 mmol/L (ref 3.5–5.1)
Sodium: 139 mmol/L (ref 135–145)

## 2018-05-30 LAB — CBC
HCT: 29.3 % — ABNORMAL LOW (ref 36.0–46.0)
Hemoglobin: 7.4 g/dL — ABNORMAL LOW (ref 12.0–15.0)
MCH: 19.7 pg — ABNORMAL LOW (ref 26.0–34.0)
MCHC: 25.3 g/dL — ABNORMAL LOW (ref 30.0–36.0)
MCV: 78.1 fL — ABNORMAL LOW (ref 80.0–100.0)
Platelets: 484 10*3/uL — ABNORMAL HIGH (ref 150–400)
RBC: 3.75 MIL/uL — ABNORMAL LOW (ref 3.87–5.11)
RDW: 27.1 % — ABNORMAL HIGH (ref 11.5–15.5)
WBC: 12.3 10*3/uL — ABNORMAL HIGH (ref 4.0–10.5)
nRBC: 1.3 % — ABNORMAL HIGH (ref 0.0–0.2)

## 2018-05-30 LAB — GLUCOSE, CAPILLARY
GLUCOSE-CAPILLARY: 122 mg/dL — AB (ref 70–99)
Glucose-Capillary: 106 mg/dL — ABNORMAL HIGH (ref 70–99)
Glucose-Capillary: 153 mg/dL — ABNORMAL HIGH (ref 70–99)
Glucose-Capillary: 189 mg/dL — ABNORMAL HIGH (ref 70–99)
Glucose-Capillary: 91 mg/dL (ref 70–99)
Glucose-Capillary: 92 mg/dL (ref 70–99)

## 2018-05-30 LAB — PROTIME-INR
INR: 1.41
Prothrombin Time: 17.1 seconds — ABNORMAL HIGH (ref 11.4–15.2)

## 2018-05-30 LAB — MAGNESIUM: Magnesium: 2.3 mg/dL (ref 1.7–2.4)

## 2018-05-30 LAB — HEMOGLOBIN: Hemoglobin: 7.9 g/dL — ABNORMAL LOW (ref 12.0–15.0)

## 2018-05-30 LAB — MRSA PCR SCREENING: MRSA by PCR: NEGATIVE

## 2018-05-30 LAB — TSH: TSH: 1.816 u[IU]/mL (ref 0.350–4.500)

## 2018-05-30 MED ORDER — PRASUGREL HCL 10 MG PO TABS: 10.0000 mg | ORAL_TABLET | Freq: Every day | ORAL | Status: DC

## 2018-05-30 MED ORDER — FUROSEMIDE 10 MG/ML IJ SOLN
80.0000 mg | Freq: Three times a day (TID) | INTRAMUSCULAR | Status: DC
  Administered 2018-05-30 – 2018-06-01 (×7): 80 mg via INTRAVENOUS
  Filled 2018-05-30 (×8): qty 8

## 2018-05-30 MED ORDER — SODIUM CHLORIDE 0.9 % IV SOLN
INTRAVENOUS | Status: DC | PRN
  Administered 2018-06-05: 1000 mL via INTRAVENOUS

## 2018-05-30 MED ORDER — INSULIN ASPART 100 UNIT/ML ~~LOC~~ SOLN
0.0000 [IU] | SUBCUTANEOUS | Status: DC
  Administered 2018-05-30: 3 [IU] via SUBCUTANEOUS

## 2018-05-30 MED ORDER — FUROSEMIDE 10 MG/ML IJ SOLN: 80.0000 mg | Freq: Two times a day (BID) | INTRAMUSCULAR | Status: DC

## 2018-05-30 MED ORDER — PIPERACILLIN-TAZOBACTAM 3.375 G IVPB
3.3750 g | Freq: Three times a day (TID) | INTRAVENOUS | Status: DC
  Administered 2018-05-30 – 2018-05-31 (×5): 3.375 g via INTRAVENOUS
  Filled 2018-05-30 (×5): qty 50

## 2018-05-30 MED ORDER — FUROSEMIDE 10 MG/ML IJ SOLN
40.0000 mg | Freq: Two times a day (BID) | INTRAMUSCULAR | Status: DC
  Administered 2018-05-30: 40 mg via INTRAVENOUS
  Filled 2018-05-30: qty 4

## 2018-05-30 MED ORDER — ORAL CARE MOUTH RINSE
15.0000 mL | Freq: Two times a day (BID) | OROMUCOSAL | Status: DC
  Administered 2018-05-30 – 2018-06-02 (×8): 15 mL via OROMUCOSAL

## 2018-05-30 MED ORDER — HYDRALAZINE HCL 20 MG/ML IJ SOLN
5.0000 mg | Freq: Four times a day (QID) | INTRAMUSCULAR | Status: DC | PRN
  Administered 2018-05-30 – 2018-05-31 (×2): 5 mg via INTRAVENOUS
  Filled 2018-05-30 (×2): qty 1

## 2018-05-30 MED ORDER — CARVEDILOL 3.125 MG PO TABS
3.1250 mg | ORAL_TABLET | Freq: Two times a day (BID) | ORAL | Status: DC
  Administered 2018-05-31 – 2018-06-09 (×17): 3.125 mg via ORAL
  Filled 2018-05-30 (×18): qty 1

## 2018-05-30 NOTE — Progress Notes (Addendum)
Shift event: (no admitting H&P on chart at time of this note) Pt was admitted earlier today with anemia that appears per labs to be iron deficient, microcytic. Hgb 5.3. She had received 2 U PRBCs and has a Hgb ordered afterwards. Her Pro BNP was elevated on admission and Lasix was given and ordered bid.  RRRN was called to bedside due to pt's respiratory status and ? Hemoptysis. NP went to bedside. S: Pt denies abdominal or chest pain. Says she did not vomit blood at home. Says she has not had any bloody stools. Says she came in because she didn't feel well.  O: VS: BP 120s, HR 70-80s, RR 25. O2 sat is normal. Poor appearing, acutely ill appearing morbidly obese AAF in no apparent distress. Sleepy, but arouses easily to name calling. Oriented x 3. There are Kleenex with pink colored sputum lying in her bed. She spits up frothy, pink mucus at times. She has upper respiratory congestion with talking and breathing. Right eye with periorbital swelling on upper eyelid. No redness, icterus of sclera or conjunctiva. No drainage from eyes. Card: S1S2, RRR. Lungs: very congested in all lobes. Abd: extremely distended and obese. Taunt. No discomfort noted on palpation. Extremities: 1+ pitting edema to pedal area of both LEs. Incontinent of brown stool at this time. No BRBPR or melana noted. Incontinent of urine.  A/P: 1. Anemia, ? Bleed-microcytic anemia noted. S/P 2 U PRBCs. Time for Hgb now and CBC in am. NP hasn't witnessed any true vomitus, just spitting up frothy pink mucus. ? More imaging if witness hemoptysis. See #2. 2. CHF with acute decompensation-pro BNP elevated on admission. Has had 80mg  total of Lasix since around 5pm (NP just gave 40mg ). Given the frothy mucus and lung sounds, it is likely CHF. Get CXR in am. Check EKG and troponins. Last echo on chart 8/19 shows EF 35% with LVH and grade II diastolic dysfunction. She is on Coreg (on hold for NPO). Cards consult-defer to attending.  3. ? Respiratory  distress-NP does not witness this. 4.  DM II-SSI while NPO. Q4hr sugars.  5. ? GIB-Pt agreed to CT abd and pelvis and this was done stat. No diverticulitis, but diverticulosis. No bowel obstruction. Protonix changed to IV q12. No evidence of need for drip at this time. Liver enzymes are normal, so no reason for Sandostatin at this time. May need GI consult in am or sooner if unstable. Defer to attending. Check PT/INR stat. Hold Effient.  6. PNA on CT ? Aspiration-start abx. Elevate HOB. NPO for now. Speech consult. Change meds to IV as much as possible.  7. VTE-SCDs.  8. HTN-Hydralazine for now.  Note: NP does not see any cardiology notes on the chart. ? If she is following with one. Do not see any hx for use of Effient. ? Hx CAD/stents.  Update: So, NP was able to ask the pt about there cardiac hx. She has a hx of MI with cardiac cath and stent placement which would explain the Effient. Her 81mg  ASA on hold as well given ? Bleed. Cards consult in am is likely needed. Dr. Aundra Dubin is her cardiologist.  Pt transferred to SDU for closer monitoring. Will follow.  Update: Hgb 7.9 after 2Units which is same as Hgb in Aug 2019. PT/INR normal. Trop < .03. Pt is hemodynamically stable at this time.  If no bleeding diagnosis, she will need her ASA and Effient started back.   CRITICAL CARE Performed by: Gardiner Barefoot Total  critical care time: 90 minutes Critical care time was exclusive of separately billable procedures and treating other patients. Critical care was necessary to treat or prevent imminent or life-threatening deterioration. Critical care was time spent personally by me on the following activities: development of treatment plan with patient and/or surrogate as well as nursing, discussions with consultants, evaluation of patient's response to treatment, examination of patient, obtaining history from patient or surrogate, ordering and performing treatments and interventions, ordering and  review of laboratory studies, ordering and review of radiographic studies, pulse oximetry and re-evaluation of patient's condition.

## 2018-05-30 NOTE — Progress Notes (Signed)
Pharmacy Antibiotic Note  Tamara Silva is a 57 y.o. female admitted on 05/29/2018 with pneumonia.  Pharmacy has been consulted for zosyn dosing.  Plan: Zosyn 3.375g IV Q8H infused over 4hrs.  Height: 5' 7.5" (171.5 cm) Weight: (!) 406 lb (184.2 kg) IBW/kg (Calculated) : 62.75  Temp (24hrs), Avg:98.9 F (37.2 C), Min:97.9 F (36.6 C), Max:100.9 F (38.3 C)  Recent Labs  Lab 05/29/18 1052  WBC 8.3  CREATININE 1.78*    Estimated Creatinine Clearance: 60.6 mL/min (A) (by C-G formula based on SCr of 1.78 mg/dL (H)).    Allergies  Allergen Reactions  . Metolazone Other (See Comments)    Dizziness and falling  . Plavix [Clopidogrel Bisulfate] Other (See Comments)    High PRU's - non-responder  . Ibuprofen     Patient stated she can not take ibuprofen.   . Nsaids Other (See Comments)    "I do not take NSAIDs d/t interfering with other meds"    Antimicrobials this admission: Zosyn 05/30/2018 >>   Dose adjustments this admission: -  Microbiology results: -  Thank you for allowing pharmacy to be a part of this patient's care.  Nani Skillern [place] 05/30/2018 1:01 AM

## 2018-05-30 NOTE — Progress Notes (Addendum)
PROGRESS NOTE    Tamara Silva  JHE:[id number] DOB: [birth year] DOA: 05/29/2018 PCP: Antony Blackbird, MD    Brief Narrative: Tamara Silva is a 57 y.o. female with medical history significant for chronic iron deficiency anemia with baseline hemoglobin 8-9, type 2 diabetes mellitus, chronic combined systolic/diastolic heart failure, stage III chronic kidney disease with baseline creatinine of 1.2, who is admitted for observation to [facility] long hospital on 05/29/2018 with acute on chronic iron deficiency anemia after presenting from home to [facility] long emergency department complaining of generalized weakness.   Patient develops acute hypoxic respiratory failure, suspect related to heart failure exacerbation. Chest x ray with asymmetric infiltrates, started on Zosyn.     Assessment & Plan:   Principal Problem:   Acute on chronic anemia Active Problems:   Essential hypertension   Chronic combined systolic and diastolic heart failure ([place])   Uncontrolled type 2 diabetes mellitus without complication, with long-term current use of insulin ([facility])   AKI (acute kidney injury) ([place])   Pressure injury of skin  1-Acute hypoxic respiratory failure; in setting of Heart failure exacerbation.  On 6 L oxygen.  Chest x ray asymmetric edema. Started on IV zosyn.  Increase lasix to 80 mg IV TID.  Monitor renal function.  Check ABG. Hypercapnia. Start BIPAP/ CCM consulted.   2-Acute on chronic systolic and diastolic Heart failure exacerbation; CAD Increase IV lasix to 80 mg TID.  Monitor I and O.  Resume carvedilol, hold  effient.  Cardiology consult/   3-Anemia; iron deficiency.  Received 2 units. Hb at 7.  Will need IV iron.  Hold effient.  No evidence of acute GI bleed.  Monitor for hemoptysis.   4-AKI; cr baseline 1.2.  On admission at 1.7.  Cardio renal.  Monitor on lasix.   5-DM; NPO . Hold levemir.  SSI.   6-Encephalopathy;  Lethargic. Speech slurred, at time. Suspect effort.    Check ABG>  CT head.      Pressure Injury 05/30/18 Stage I -  Intact skin with non-blanchable redness of a localized area usually over a bony prominence. (Active)  05/30/18 0030  Location: Elbow  Location Orientation: Right  Staging: Stage I -  Intact skin with non-blanchable redness of a localized area usually over a bony prominence.  Wound Description (Comments):   Present on Admission: Yes                  Estimated body mass index is 61.42 kg/m as calculated from the following:   Height as of this encounter: 5' 7.5" (1.715 m).   Weight as of this encounter: 180.5 kg.   DVT prophylaxis: scd Code Status: full code.  Family Communication: friend at bedside.  Disposition Plan: remain step down unit,   Consultants:   none   Procedures: none  Antimicrobials: zosyn   Subjective: Sleepy, lethargic, speech slurred at time.  Denies chest pain, denies dyspnea.   Objective: Vitals:   05/30/18 0300 05/30/18 0400 05/30/18 0500 05/30/18 0600  BP: (!) 144/61 (!) 121/56 (!) 116/56 139/79  Pulse:   72 77  Resp: (!) 26 (!) 27 (!) 27 (!) 21  Temp: 99.3 F (37.4 C) 99.7 F (37.6 C) 99.9 F (37.7 C) 100 F (37.8 C)  TempSrc: Core Core Core   SpO2: 100% 98% 100% 99%  Weight:      Height:        Intake/Output Summary (Last 24 hours) at 05/30/2018 0820 Last data filed at 05/30/2018 0655 Gross per 24  hour  Intake 376.54 ml  Output 850 ml  Net -473.46 ml   Filed Weights   05/29/18 0823 05/29/18 1600 05/30/18 0107  Weight: (!) 145.2 kg (!) 184.2 kg (!) 180.5 kg    Examination:  General exam: Appears calm and comfortable  Respiratory system: bilateral crackles. Tachypnea.  Cardiovascular system: S1 & S2 heard, RRR. No JVD, murmurs, rubs, gallops or clicks. No pedal edema. Gastrointestinal system: Abdomen  Is very disetended Central nervous system: sleepy, speech slurred, generalized weakness.  Extremities: plus 2 edema.  Skin: No rashes, lesions or  ulcers    Data Reviewed: I have personally reviewed following labs and imaging studies  CBC: Recent Labs  Lab 05/29/18 1052 05/30/18 0126 05/30/18 0754  WBC 8.3  --  12.3*  NEUTROABS 5.5  --   --   HGB 5.7* 7.9* 7.4*  HCT 24.2*  --  29.3*  MCV 74.5*  --  78.1*  PLT 495*  --  315*   Basic Metabolic Panel: Recent Labs  Lab 05/29/18 1052  NA 137  K 4.7  CL 96*  CO2 35*  GLUCOSE 231*  BUN 40*  CREATININE 1.78*  CALCIUM 8.3*   GFR: Estimated Creatinine Clearance: 59.8 mL/min (A) (by C-G formula based on SCr of 1.78 mg/dL (H)). Liver Function Tests: Recent Labs  Lab 05/29/18 1052  AST 21  ALT 13  ALKPHOS 102  BILITOT 0.7  PROT 8.3*  ALBUMIN 2.8*   No results for input(s): LIPASE, AMYLASE in the last 168 hours. No results for input(s): AMMONIA in the last 168 hours. Coagulation Profile: Recent Labs  Lab 05/30/18 0126  INR 1.41   Cardiac Enzymes: Recent Labs  Lab 05/29/18 1052 05/30/18 0126  TROPONINI <0.03 <0.03   BNP (last 3 results) No results for input(s): PROBNP in the last 8760 hours. HbA1C: No results for input(s): HGBA1C in the last 72 hours. CBG: Recent Labs  Lab 05/29/18 1620 05/30/18 0031 05/30/18 0428 05/30/18 0801  GLUCAP 179* 189* 153* 122*   Lipid Profile: No results for input(s): CHOL, HDL, LDLCALC, TRIG, CHOLHDL, LDLDIRECT in the last 72 hours. Thyroid Function Tests: No results for input(s): TSH, T4TOTAL, FREET4, T3FREE, THYROIDAB in the last 72 hours. Anemia Panel: Recent Labs    05/29/18 1237  VITAMINB12 981*  FOLATE 21.5  FERRITIN 9*  TIBC 471*  IRON 15*  RETICCTPCT 1.2   Sepsis Labs: No results for input(s): PROCALCITON, LATICACIDVEN in the last 168 hours.  Recent Results (from the past 240 hour(s))  MRSA PCR Screening     Status: None   Collection Time: 05/30/18 12:09 AM  Result Value Ref Range Status   MRSA by PCR NEGATIVE NEGATIVE Final    Comment:        The GeneXpert MRSA Assay (FDA approved for NASAL  specimens only), is one component of a comprehensive MRSA colonization surveillance program. It is not intended to diagnose MRSA infection nor to guide or monitor treatment for MRSA infections. Performed at [facility], [place] [address] [place], [place] [address]          Radiology Studies: Ct Abdomen Pelvis Wo Contrast  Result Date: 05/29/2018 CLINICAL DATA:  Abdominal distension. Severe anemia. GI bleed. EXAM: CT ABDOMEN AND PELVIS WITHOUT CONTRAST TECHNIQUE: Multidetector CT imaging of the abdomen and pelvis was performed following the standard protocol without IV contrast. COMPARISON:  06/23/2015 abdominal sonogram. FINDINGS: Lower chest: Mild patchy consolidation in peripheral right middle lobe. Patchy tree-in-bud opacity in the dependent right  lower lobe. Parenchymal banding at both lung bases compatible with postinfectious/postinflammatory scarring. Mild cardiomegaly. Right coronary atherosclerosis. Posterior left pleural coarse calcification. Hepatobiliary: Diffuse hepatic steatosis. No definite liver surface irregularity. No liver mass. Contracted gallbladder with no radiopaque cholelithiasis. No biliary ductal dilatation. Pancreas: Normal, with no mass or duct dilation. Spleen: Normal size. No mass. Adrenals/Urinary Tract: Normal adrenals. No hydronephrosis. No renal stones. No contour deforming renal masses. Normal bladder. Stomach/Bowel: Mild gaseous distention of the stomach with gastric fluid level. No definite gastric wall thickening. Normal caliber small bowel with no small bowel wall thickening. Candidate appendix appears normal. Mild sigmoid diverticulosis, with no large bowel wall thickening or significant pericolonic fat stranding. Vascular/Lymphatic: Atherosclerotic nonaneurysmal abdominal aorta. No pathologically enlarged lymph nodes in the abdomen or pelvis. Reproductive: Grossly normal uterus.  No adnexal mass. Other: No pneumoperitoneum. Small to  moderate volume ascites. No focal fluid collection. Moderate anasarca. Musculoskeletal: No aggressive appearing focal osseous lesions. IMPRESSION: 1. No evidence of bowel obstruction or acute bowel inflammation. Mild sigmoid diverticulosis, with no evidence of acute diverticulitis. 2. Small to moderate volume ascites. Moderate anasarca. 3. Mild patchy consolidation in the peripheral right middle lobe. Patchy tree-in-bud opacity in the dependent right lower lobe. Findings suggest a mild bronchopneumonia, with the differential including aspiration. Suggest attention on follow-up chest imaging in 3 months. 4. Diffuse hepatic steatosis. 5.  Aortic Atherosclerosis (ICD10-I70.0). Electronically Signed   By: Ilona Sorrel M.D.   On: 05/29/2018 23:52   Dg Chest Port 1 View  Result Date: 05/30/2018 CLINICAL DATA:  Assess for congestive heart failure. Concern for aspiration pneumonia. EXAM: PORTABLE CHEST 1 VIEW COMPARISON:  Chest radiograph performed 01/13/2018 FINDINGS: Diffuse right-sided airspace opacification may reflect asymmetric pulmonary edema or pneumonia. Underlying aspiration cannot be excluded, though the pattern is not particularly indicative of aspiration. No significant pleural effusion or pneumothorax is seen. The cardiomediastinal silhouette is borderline normal in size. No acute osseous abnormalities are identified. IMPRESSION: Diffuse right-sided airspace opacification may reflect asymmetric pulmonary edema or pneumonia. Underlying aspiration cannot be excluded, given clinical concern, though the pattern is not particularly indicative of aspiration. Electronically Signed   By: Garald Balding M.D.   On: 05/30/2018 05:37        Scheduled Meds: . furosemide      . furosemide  80 mg Intravenous BID  . insulin aspart  0-15 Units Subcutaneous Q4H  . mouth rinse  15 mL Mouth Rinse BID  . pantoprazole (PROTONIX) IV  40 mg Intravenous Q12H   Continuous Infusions: . sodium chloride    .  piperacillin-tazobactam (ZOSYN)  IV 12.5 mL/hr at 05/30/18 0600     LOS: 0 days    Time spent: 35 minutes    Elmarie Shiley, MD [facility] Pager [phone]  If 7PM-7AM, please contact night-coverage [email or web address] Password [email or web address] 05/30/2018, 8:20 AM

## 2018-05-30 NOTE — ED Provider Notes (Signed)
[facility]-ICU/STEPDOWN Provider Note   CSN: [id number] Arrival date & time: 05/29/18  0818     History   Chief Complaint Chief Complaint  Patient presents with  . general malaise    HPI Tamara Silva is a 57 y.o. female.   Weakness  Primary symptoms include loss of balance, dizziness.  Primary symptoms include no focal weakness, no memory loss. This is a new problem. The current episode started more than 2 days ago. The problem has been gradually worsening. There was no focality noted. There has been no fever. Associated symptoms include shortness of breath and confusion. Pertinent negatives include no chest pain, no altered mental status and no headaches. There were no medications administered prior to arrival.    Past Medical History:  Diagnosis Date  . Asthma   . Chronic combined systolic (congestive) and diastolic (congestive) heart failure ([place])   . Chronic iron deficiency anemia   . Diabetes mellitus without complication ([place])   . Hypertension     Patient Active Problem List   Diagnosis Date Noted  . AKI (acute kidney injury) ([place]) 05/30/2018  . Pressure injury of skin 05/30/2018  . Acute on chronic anemia 05/29/2018  . Chronic respiratory failure with hypoxia, on home O2 therapy ([place]) 01/27/2018  . Iron deficiency anemia 01/27/2018  . Tobacco abuse disorder 10/03/2017  . Dyslipidemia 04/29/2016  . Primary insomnia 04/29/2016  . Hypoxemia 09/08/2015  . CHF (congestive heart failure) ([place]) 09/08/2015  . Obesity hypoventilation syndrome ([place])   . Microcytic anemia   . Acute encephalopathy   . Uncontrolled type 2 diabetes mellitus with hyperosmolarity without coma ([place])   . Acute on chronic respiratory failure with hypercapnia ([place])   . CAP (community acquired pneumonia)   . OSA (obstructive sleep apnea)   . Acute on chronic respiratory failure ([place]) 06/22/2015  . Acute on chronic combined systolic (congestive) and diastolic (congestive)  heart failure ([place]) 06/21/2015  . Hypoglycemia 06/21/2015  . Uncontrolled type 2 diabetes mellitus without complication, with long-term current use of insulin ([place])   . Dyslipidemia associated with type 2 diabetes mellitus ([place]) 05/21/2015  . Anemia of chronic kidney failure 05/21/2015  . Hyperkalemia 05/21/2015  . COPD GOLD III  06/10/2014  . Acute respiratory failure with hypoxia ([place]) 03/04/2014  . CKD (chronic kidney disease), stage II 02/07/2014  . COPD exacerbation ([place]) 02/01/2014  . CAD S/P percutaneous coronary angioplasty - prior PCI to LAD; RCA PCI: new Xience Alpine DES 2.75 mm x 15 mm  10/07/2013  . Morbid obesity ([place]) 07/30/2013  . Chronic combined systolic and diastolic heart failure ([place]) 07/10/2013  . Chronic pulmonary embolism ([place]) 02/08/2013  . Bipolar disorder ([place]) 02/08/2013  . Essential hypertension 02/08/2013  . Chronic anticoagulation 11/27/2012  . Edema extremities 10/15/2012  . COPD (chronic obstructive pulmonary disease) ([place]) 10/15/2012  . Pulmonary embolism on right ([place]) 10/15/2012  . Hypertension 10/15/2012  . Insulin dependent diabetes mellitus ([place]) 10/15/2012    Past Surgical History:  Procedure Laterality Date  . COLONOSCOPY WITH PROPOFOL N/A 08/03/2013   Procedure: COLONOSCOPY WITH PROPOFOL;  Surgeon: Jeryl Columbia, MD;  Location: [facility];  Service: Endoscopy;  Laterality: N/A;  . CORONARY ANGIOPLASTY WITH STENT PLACEMENT     CAD in 2006 x 2 and 2009 2 more- place din REx in [place] and [name] [facility]  . CORONARY ANGIOPLASTY WITH STENT PLACEMENT  10/07/2013   Xience Alpine DES 2.75  mm x 15  mm  . ESOPHAGOGASTRODUODENOSCOPY (EGD) WITH PROPOFOL  N/A 08/03/2013   Procedure: ESOPHAGOGASTRODUODENOSCOPY (EGD) WITH PROPOFOL;  Surgeon: Jeryl Columbia, MD;  Location: [facility];  Service: Endoscopy;  Laterality: N/A;  . LEFT HEART CATHETERIZATION WITH CORONARY ANGIOGRAM N/A 10/07/2013   Procedure: LEFT HEART CATHETERIZATION WITH CORONARY ANGIOGRAM;  Surgeon:  Leonie Man, MD;  Location: [facility] CATH LAB;  Service: Cardiovascular;  Laterality: N/A;     OB History   No obstetric history on file.      Home Medications    Prior to Admission medications   Medication Sig Start Date End Date Taking? Authorizing Provider  ACCU-CHEK AVIVA PLUS test strip USE 1 STRIP TO CHECK GLUCOSE 3 TIMES DAILY 03/30/18  Yes Fulp, Cammie, MD  ACCU-CHEK FASTCLIX LANCETS MISC 1 applicator by Does not apply route 4 (four) times daily -  before meals and at bedtime. 08/07/15  Yes Charlott Rakes, MD  ACCU-CHEK SOFTCLIX LANCETS lancets USE 1 LANCET TO CHECK GLUCOSE 3 TIMES DAILY 01/14/16  Yes Tresa Garter, MD  aspirin 81 MG tablet Take 1 tablet (81 mg total) by mouth daily. 10/09/13  Yes Barrett, Evelene Croon, PA-C  Blood Glucose Monitoring Suppl (ACCU-CHEK AVIVA) device Use as instructed 3 times daily before meals. 08/06/15  Yes Newlin, Charlane Ferretti, MD  carvedilol (COREG) 12.5 MG tablet TAKE 1 TABLET BY MOUTH 2 TIMES A DAY WITH A MEAL **PT NEEDS TO SCHEDULE AN APPT WITH OUR OFFICE AT [phone] FOR FUTURE REFILLS 04/25/18  Yes Larey Dresser, MD  fluticasone (FLONASE) 50 MCG/ACT nasal spray INSTILL 2 SPRAYS IN EACH NOSTRIL TWICE DAILY. 04/08/17  Yes Tresa Garter, MD  furosemide (LASIX) 40 MG tablet Take 1.5 tablets (60 mg total) by mouth 2 (two) times daily. 02/24/18 02/24/19 Yes Fulp, Cammie, MD  Insulin Pen Needle (TRUEPLUS PEN NEEDLES) 32G X 4 MM MISC Use three times daily to inject insulin 02/02/18  Yes Fulp, Cammie, MD  INSULIN SYRINGE .5CC/29G 29G X 1/2" 0.5 ML MISC Use as instructed by physician 01/27/18  Yes Fulp, Cammie, MD  ipratropium-albuterol (DUONEB) 0.5-2.5 (3) MG/3ML SOLN Use nebulizer to inhale contents of one vial every 6 hours as needed for shortness of breath, wheezing 01/27/18  Yes Fulp, Cammie, MD  LEVEMIR 100 UNIT/ML injection INJECT 45 UNITS SUBCUTANEOUSLY EVERY NIGHT AT BEDTIME *MUST HAVE OFFICE VISIT FOR REFILLS* 02/24/18  Yes Fulp, Cammie, MD    losartan (COZAAR) 50 MG tablet Take 1 tablet (50 mg total) by mouth daily. 01/27/18  Yes Fulp, Cammie, MD  nitroGLYCERIN (NITROSTAT) 0.4 MG SL tablet Place 1 tablet (0.4 mg total) under the tongue every 5 (five) minutes as needed for chest pain. 01/27/18  Yes Fulp, Cammie, MD  NOVOLOG FLEXPEN 100 UNIT/ML FlexPen INJECT 15-20 UNITS SUBCUTANEOUSLY 3 TIMES DAILY WITH MEALS. **PATIENT MUST HAVE OFFICE VISIT FOR FUTURE REFILLS** 02/24/18  Yes Fulp, Cammie, MD  omeprazole (PRILOSEC) 40 MG capsule TAKE 1 CAPSULE BY MOUTH TWICE DAILY. 04/08/17  Yes Tresa Garter, MD  polyethylene glycol (MIRALAX / GLYCOLAX) packet Take 17 g by mouth daily. 01/27/18  Yes Fulp, Cammie, MD  prasugrel (EFFIENT) 10 MG TABS tablet TAKE ONE TABLET BY MOUTH DAILY. 05/29/18  Yes Bensimhon, Shaune Pascal, MD  rosuvastatin (CRESTOR) 10 MG tablet Take 1 tablet (10 mg total) by mouth daily. 01/27/18  Yes Fulp, Cammie, MD  spironolactone (ALDACTONE) 25 MG tablet Take 1 tablet (25 mg total) by mouth daily. 01/27/18  Yes Fulp, Cammie, MD  iron polysaccharides (NIFEREX) 150 MG capsule Take 1 capsule (150 mg total) by mouth  daily. Patient not taking: Reported on 05/29/2018 01/27/18   Fulp, Cammie, MD  prazosin (MINIPRESS) 5 MG capsule TAKE 1 CAPSULE BY MOUTH EVERY NIGHT AT BEDTIME FOR NIGHTMARES. Patient not taking: Reported on 05/29/2018 03/30/18   Fulp, Ander Gaster, MD  PROAIR HFA 108 (90 Base) MCG/ACT inhaler INHALE 2 PUFFS BY MOUTH 4 TIMES DAILY AS NEEDED Patient not taking: Reported on 05/29/2018 02/24/18   Fulp, Ander Gaster, MD  senna (SENOKOT) 8.6 MG TABS tablet Take 1 tablet (8.6 mg total) by mouth 2 (two) times daily. Patient not taking: Reported on 05/29/2018 01/18/18   Doreatha Lew, MD    Family History Family History  Problem Relation Age of Onset  . Stroke Mother   . Hypertension Mother   . Colon cancer Father   . Diabetes Father   . Heart attack Father   . Sarcoidosis Other   . Breast cancer Paternal Aunt   . Emphysema  Brother   . Lung disease Brother        Unknown type, 3 brothers, one with liver and lung disease  . Asthma Paternal Aunt     Social History Social History   Tobacco Use  . Smoking status: Former Smoker    Packs/day: 0.50    Years: 20.00    Pack years: 10.00    Types: Cigarettes    Last attempt to quit: 04/30/2014    Years since quitting: 4.0  . Smokeless tobacco: Never Used  . Tobacco comment: smoked off and on x 20 years  Substance Use Topics  . Alcohol use: No    Alcohol/week: 0.0 standard drinks  . Drug use: No     Allergies   Metolazone; Plavix [clopidogrel bisulfate]; Ibuprofen; and Nsaids   Review of Systems Review of Systems  Respiratory: Positive for shortness of breath.   Cardiovascular: Negative for chest pain.  Neurological: Positive for dizziness, weakness and loss of balance. Negative for focal weakness and headaches.  Psychiatric/Behavioral: Positive for confusion. Negative for memory loss.  All other systems reviewed and are negative.    Physical Exam Updated Vital Signs BP 138/69 (BP Location: Right Arm)   Pulse 69   Temp 99.9 F (37.7 C)   Resp 20   Ht 5' 7.5" (1.715 m)   Wt (!) 180.5 kg   LMP 07/28/2013 Comment: perimenopausal  SpO2 100% Comment: uses 4 lpm at home  BMI 61.42 kg/m   Physical Exam Vitals signs and nursing note reviewed.  Constitutional:      Appearance: She is well-developed.  HENT:     Head: Normocephalic and atraumatic.     Mouth/Throat:     Mouth: Mucous membranes are dry.  Eyes:     Extraocular Movements: Extraocular movements intact.     Conjunctiva/sclera: Conjunctivae normal.  Neck:     Musculoskeletal: Normal range of motion.  Cardiovascular:     Rate and Rhythm: Normal rate and regular rhythm.  Pulmonary:     Effort: No respiratory distress.     Breath sounds: No stridor.  Abdominal:     General: Abdomen is flat. There is no distension.  Neurological:     General: No focal deficit present.      Mental Status: She is alert.      ED Treatments / Results  Labs (all labs ordered are listed, but only abnormal results are displayed) Labs Reviewed  COMPREHENSIVE METABOLIC PANEL - Abnormal; Notable for the following components:      Result Value   Chloride 96 (*)  CO2 35 (*)    Glucose, Bld 231 (*)    BUN 40 (*)    Creatinine, Ser 1.78 (*)    Calcium 8.3 (*)    Total Protein 8.3 (*)    Albumin 2.8 (*)    GFR calc non Af Amer 31 (*)    GFR calc Af Amer 36 (*)    All other components within normal limits  CBC WITH DIFFERENTIAL/PLATELET - Abnormal; Notable for the following components:   RBC 3.25 (*)    Hemoglobin 5.7 (*)    HCT 24.2 (*)    MCV 74.5 (*)    MCH 17.5 (*)    MCHC 23.6 (*)    RDW 26.0 (*)    Platelets 495 (*)    nRBC 1.8 (*)    All other components within normal limits  URINALYSIS, COMPLETE (UACMP) WITH MICROSCOPIC - Abnormal; Notable for the following components:   Color, Urine AMBER (*)    APPearance CLOUDY (*)    Hgb urine dipstick SMALL (*)    Ketones, ur 5 (*)    Protein, ur 100 (*)    Bacteria, UA RARE (*)    Non Squamous Epithelial 0-5 (*)    All other components within normal limits  BRAIN NATRIURETIC PEPTIDE - Abnormal; Notable for the following components:   B Natriuretic Peptide 1,218.8 (*)    All other components within normal limits  VITAMIN B12 - Abnormal; Notable for the following components:   Vitamin B-12 981 (*)    All other components within normal limits  IRON AND TIBC - Abnormal; Notable for the following components:   Iron 15 (*)    TIBC 471 (*)    Saturation Ratios 3 (*)    All other components within normal limits  FERRITIN - Abnormal; Notable for the following components:   Ferritin 9 (*)    All other components within normal limits  RETICULOCYTES - Abnormal; Notable for the following components:   RBC. 3.25 (*)    Immature Retic Fract 42.9 (*)    All other components within normal limits  GLUCOSE, CAPILLARY -  Abnormal; Notable for the following components:   Glucose-Capillary 179 (*)    All other components within normal limits  BASIC METABOLIC PANEL - Abnormal; Notable for the following components:   CO2 33 (*)    Glucose, Bld 137 (*)    BUN 41 (*)    Creatinine, Ser 1.66 (*)    Calcium 8.2 (*)    GFR calc non Af Amer 34 (*)    GFR calc Af Amer 39 (*)    All other components within normal limits  CBC - Abnormal; Notable for the following components:   WBC 12.3 (*)    RBC 3.75 (*)    Hemoglobin 7.4 (*)    HCT 29.3 (*)    MCV 78.1 (*)    MCH 19.7 (*)    MCHC 25.3 (*)    RDW 27.1 (*)    Platelets 484 (*)    nRBC 1.3 (*)    All other components within normal limits  HEMOGLOBIN - Abnormal; Notable for the following components:   Hemoglobin 7.9 (*)    All other components within normal limits  GLUCOSE, CAPILLARY - Abnormal; Notable for the following components:   Glucose-Capillary 189 (*)    All other components within normal limits  PROTIME-INR - Abnormal; Notable for the following components:   Prothrombin Time 17.1 (*)    All other components within normal  limits  GLUCOSE, CAPILLARY - Abnormal; Notable for the following components:   Glucose-Capillary 153 (*)    All other components within normal limits  GLUCOSE, CAPILLARY - Abnormal; Notable for the following components:   Glucose-Capillary 122 (*)    All other components within normal limits  BLOOD GAS, ARTERIAL - Abnormal; Notable for the following components:   pH, Arterial 7.239 (*)    pCO2 arterial 90.8 (*)    Bicarbonate 37.5 (*)    Acid-Base Excess 9.0 (*)    All other components within normal limits  MRSA PCR SCREENING  TROPONIN I  FOLATE  SODIUM, URINE, RANDOM  CREATININE, URINE, RANDOM  TSH  MAGNESIUM  TROPONIN I  TROPONIN I  TROPONIN I  POC OCCULT BLOOD, ED  TYPE AND SCREEN  PREPARE RBC (CROSSMATCH)    EKG EKG Interpretation  Date/Time:  Monday May 29 2018 12:19:47 EST Ventricular Rate:   80 PR Interval:    QRS Duration: 77 QT Interval:  507 QTC Calculation: 585 R Axis:   93 Text Interpretation:  Sinus rhythm Borderline right axis deviation Low voltage, precordial leads Nonspecific T abnrm, anterolateral leads Prolonged QT interval no significant change since Aug 2019 Confirmed by Sherwood Gambler [phone]) on 05/30/2018 10:02:09 AM   Radiology Ct Abdomen Pelvis Wo Contrast  Result Date: 05/29/2018 CLINICAL DATA:  Abdominal distension. Severe anemia. GI bleed. EXAM: CT ABDOMEN AND PELVIS WITHOUT CONTRAST TECHNIQUE: Multidetector CT imaging of the abdomen and pelvis was performed following the standard protocol without IV contrast. COMPARISON:  06/23/2015 abdominal sonogram. FINDINGS: Lower chest: Mild patchy consolidation in peripheral right middle lobe. Patchy tree-in-bud opacity in the dependent right lower lobe. Parenchymal banding at both lung bases compatible with postinfectious/postinflammatory scarring. Mild cardiomegaly. Right coronary atherosclerosis. Posterior left pleural coarse calcification. Hepatobiliary: Diffuse hepatic steatosis. No definite liver surface irregularity. No liver mass. Contracted gallbladder with no radiopaque cholelithiasis. No biliary ductal dilatation. Pancreas: Normal, with no mass or duct dilation. Spleen: Normal size. No mass. Adrenals/Urinary Tract: Normal adrenals. No hydronephrosis. No renal stones. No contour deforming renal masses. Normal bladder. Stomach/Bowel: Mild gaseous distention of the stomach with gastric fluid level. No definite gastric wall thickening. Normal caliber small bowel with no small bowel wall thickening. Candidate appendix appears normal. Mild sigmoid diverticulosis, with no large bowel wall thickening or significant pericolonic fat stranding. Vascular/Lymphatic: Atherosclerotic nonaneurysmal abdominal aorta. No pathologically enlarged lymph nodes in the abdomen or pelvis. Reproductive: Grossly normal uterus.  No adnexal mass.  Other: No pneumoperitoneum. Small to moderate volume ascites. No focal fluid collection. Moderate anasarca. Musculoskeletal: No aggressive appearing focal osseous lesions. IMPRESSION: 1. No evidence of bowel obstruction or acute bowel inflammation. Mild sigmoid diverticulosis, with no evidence of acute diverticulitis. 2. Small to moderate volume ascites. Moderate anasarca. 3. Mild patchy consolidation in the peripheral right middle lobe. Patchy tree-in-bud opacity in the dependent right lower lobe. Findings suggest a mild bronchopneumonia, with the differential including aspiration. Suggest attention on follow-up chest imaging in 3 months. 4. Diffuse hepatic steatosis. 5.  Aortic Atherosclerosis (ICD10-I70.0). Electronically Signed   By: Ilona Sorrel M.D.   On: 05/29/2018 23:52   Dg Chest Port 1 View  Result Date: 05/30/2018 CLINICAL DATA:  Assess for congestive heart failure. Concern for aspiration pneumonia. EXAM: PORTABLE CHEST 1 VIEW COMPARISON:  Chest radiograph performed 01/13/2018 FINDINGS: Diffuse right-sided airspace opacification may reflect asymmetric pulmonary edema or pneumonia. Underlying aspiration cannot be excluded, though the pattern is not particularly indicative of aspiration. No significant pleural effusion or pneumothorax  is seen. The cardiomediastinal silhouette is borderline normal in size. No acute osseous abnormalities are identified. IMPRESSION: Diffuse right-sided airspace opacification may reflect asymmetric pulmonary edema or pneumonia. Underlying aspiration cannot be excluded, given clinical concern, though the pattern is not particularly indicative of aspiration. Electronically Signed   By: Garald Balding M.D.   On: 05/30/2018 05:37    Procedures .Critical Care Performed by: Merrily Pew, MD Authorized by: Merrily Pew, MD   Critical care provider statement:    Critical care time (minutes):  45   Critical care was necessary to treat or prevent imminent or  life-threatening deterioration of the following conditions:  Circulatory failure, dehydration and shock   Critical care was time spent personally by me on the following activities:  Discussions with consultants, evaluation of patient's response to treatment, examination of patient, ordering and performing treatments and interventions, ordering and review of laboratory studies, ordering and review of radiographic studies, pulse oximetry, re-evaluation of patient's condition, obtaining history from patient or surrogate and review of old charts   I assumed direction of critical care for this patient from another provider in my specialty: no     (including critical care time)  Medications Ordered in ED Medications  ondansetron (ZOFRAN) tablet 4 mg ( Oral See Alternative 05/29/18 1850)    Or  ondansetron (ZOFRAN) injection 4 mg (4 mg Intravenous Given 05/29/18 1850)  albuterol (PROVENTIL) (2.5 MG/3ML) 0.083% nebulizer solution 2.5 mg (2.5 mg Nebulization Given 05/29/18 2134)  furosemide (LASIX) 10 MG/ML injection (0 mg  Hold 05/29/18 2306)  pantoprazole (PROTONIX) injection 40 mg (40 mg Intravenous Given 05/30/18 0915)  insulin aspart (novoLOG) injection 0-15 Units (0 Units Subcutaneous Not Given 05/30/18 0827)  hydrALAZINE (APRESOLINE) injection 5 mg (5 mg Intravenous Given 05/30/18 0130)  piperacillin-tazobactam (ZOSYN) IVPB 3.375 g ( Intravenous Rate/Dose Verify 05/30/18 0600)  MEDLINE mouth rinse (15 mLs Mouth Rinse Given 05/30/18 0915)  0.9 %  sodium chloride infusion (has no administration in time range)  furosemide (LASIX) injection 80 mg (80 mg Intravenous Given 05/30/18 0923)  carvedilol (COREG) tablet 3.125 mg (3.125 mg Oral Not Given 05/30/18 0915)  0.9 %  sodium chloride infusion (Manually program via Guardrails IV Fluids) ( Intravenous New Bag/Given 05/29/18 1501)  furosemide (LASIX) injection 20 mg (20 mg Intravenous Given 05/29/18 1851)  furosemide (LASIX) injection 20 mg (20 mg  Intravenous Given 05/29/18 2240)  furosemide (LASIX) injection 40 mg (40 mg Intravenous Given 05/29/18 2307)     Initial Impression / Assessment and Plan / ED Course  I have reviewed the triage vital signs and the nursing notes.  Pertinent labs & imaging results that were available during my care of the patient were reviewed by me and considered in my medical decision making (see chart for details).     Acute anemia of unclear etiology.  Anemia panel added on.  Hemoccult ordered.  Will admit to hospitalist for blood transfusions and further management work-up of her generalized weakness secondary to anemia.  Final Clinical Impressions(s) / ED Diagnoses   Final diagnoses:  Anemia, unspecified type    ED Discharge Orders    None       Derak Schurman, Corene Cornea, MD 05/30/18 1035

## 2018-05-30 NOTE — Progress Notes (Signed)
Per RN, PT placed back on BiPAP at approximately 1430.

## 2018-05-30 NOTE — Progress Notes (Signed)
Sliding scale insulin discontinued since patient is not eating per Dr Frederic Jericho

## 2018-05-30 NOTE — Consult Note (Addendum)
NAME:  Foye Haggart, MRN:  [id number], DOB:  1960-10-22, LOS: 0 ADMISSION DATE:  05/29/2018, CONSULTATION DATE:  12/31 REFERRING MD:  Tyrell Antonio, CHIEF COMPLAINT:  Acute on chronic HC respiratory failure   Brief History   57 year old female with past medical history as mentioned below admitted initially on 12/30 with a working diagnosis of symptomatic anemia, hemoglobin 5.7.  Received 2 units of blood, however over the following 24 hours developed progressive hypercarbic respiratory failure and right greater than left airspace disease on chest x-ray.  Pulmonary asked to see for hypercarbic respiratory failure.  History of present illness   57 year old female patient who was admitted on 12/30 with chief complaint of generalized weakness over 1 to 2 days with associated lightheadedness.  There is no chest pain, shortness of breath, palpitations, presyncope or syncope noted.  In the emergency room diagnostic evaluation did identify hemoglobin of 5.7.  There was no evidence of bleeding and fecal occult blood was negative.  She was admitted with working diagnosis of symptomatic anemia; received 2 units of blood, and was admitted for further evaluation.  Hospital course: Rapid response called on 12/31 due to change in respiratory status and possible hemoptysis.  On evaluation the patient was found to be lethargic arousing only to name calling.  She was coughing frothy pink sputum.  Breath sounds more coarse.  She was felt to have acute acute decompensated heart failure as a complication of recent transfusion.  She was transferred to the stepdown unit for closer monitoring.  Portable chest x-ray was obtained, this demonstrated right greater than left airspace disease with cardiomegaly this had rapidly progressed in comparison to scout imaging from CT scan on the 30th.  She was treated with IV Lasix, and supplemental oxygen, and also least started empirically on IV Zosyn for possible pneumonia.  An arterial  blood gas was obtained this demonstrated a PCO2 of 90.8 with a pH of 7.24 and a bicarbonate of 37.5 because of her hypercarbic respiratory failure pulmonary was asked to evaluate.   Past Medical History  Type 2 diabetes, Chronic Hypoxic resp failure, GOLD III COPD (Anoro daily, PRN duoneb plus oxygen), tobacco abuse,  CKD stage III with baseline creatinine 1.2, systolic heart failure with EF 35 to 40%.  Iron deficiency anemia with baseline hemoglobin 8-9, OSA on CPAP  Significant Hospital Events   12/30: Admitted with symptomatic anemia hemoglobin less than 6.  Transfused 2 units 12/31: Developed cough productive of frothy pink sputum, worsening respiratory distress, and progressive hypercarbia, pulmonary consulted  Consults:  Pulmonary consulted 12/31 for hypercarbic respiratory failure Cardiology consulted 74/94 for acute systolic heart failure  Procedures:   Significant Diagnostic Tests:   CT abdomen pelvis obtained on 12/30: This shows negative for bowel obstruction or acute inflammation.  There was mild sigmoid diverticulosis but no evidence of diverticulitis.  There were small moderate volume of ascites with moderate anasarca there is patchy consolidation in the right middle and right lower lobe with tree-in-bud opacifications in the right lower lobe there is diffuse hepatic steatosis Micro Data:  Urine strep 12/31 Urine Legionella 12/31 Respiratory viral panel 12/31   Antimicrobials:  Zosyn 12/31  Interim history/subjective:    Objective   Blood pressure (Abnormal) 122/55, pulse 71, temperature 99.9 F (37.7 C), resp. rate (Abnormal) 27, height 5' 7.5" (1.715 m), weight (Abnormal) 180.5 kg, last menstrual period 07/28/2013, SpO2 97 %.        Intake/Output Summary (Last 24 hours) at 05/30/2018 1157 Last data filed at 05/30/2018  3500 Gross per 24 hour  Intake 376.54 ml  Output 1025 ml  Net -648.46 ml   Filed Weights   05/29/18 0823 05/29/18 1600 05/30/18 0107    Weight: (Abnormal) 145.2 kg (Abnormal) 184.2 kg (Abnormal) 180.5 kg    Examination: General: 57 year old aaf resting in bed. She is in no acute distress HENT: NCAT, phonation is hoarse. No JVD Lungs: decreased t/o no accessory use Cardiovascular: rrr, no MRG Abdomen: soft not tender + bowel sounds  Extremities: LE edema brisk CR  Neuro: awake and alert  GU: voids   Resolved Hospital Problem list     Assessment & Plan:  Acute on chronic Hypercarbic respiratory failure in setting of Right > Left airspace disease superimposed on underlying COPD:  most likely reflecting acute pulmonary edema however CAP or aspiration not excluded.  PCXR right > left airspace disease Plan Cont supplemental oxygen ->sat goal >88% Lasix IV scheduled BIPAP at HS and w/ rest.  Would likely benefit from BIPAP at home Change abx to unasyn for now (doubt this is PNA) prob will dc am  Send urine studies   OSA Plan prob need BIPAP at HS   Acute decompensated biventricular HF w/ Pulmonary edema Plan Cont tele  KVO IVFs Cont coreg PRN hydralazine  Acute metabolic encephalopathy 2/2 hypercarbia Plan Dc CT brain Hold sedation   H/o CAD Plan Cont asa  Cont tele   CKD stage III -baseline cr 1.2 Plan Serial chemistries Cont lasix Renal dose meds  Acute on chronic anemia (feso4 def) Plan Trend cbc Transfuse for hgb < 7  DM Plan ssi   Best practice:  Diet:cl liq  Pain/Anxiety/Delirium protocol (if indicated): na VAP protocol (if indicated): na DVT prophylaxis: scds given anemia   GI prophylaxis: PPI Glucose control: na Mobility: br Code Status: full code Family Communication: sister  Disposition: critically ill. req IV diuresis and titration of BIPAP   Labs   CBC: Recent Labs  Lab 05/29/18 1052 05/30/18 0126 05/30/18 0754  WBC 8.3  --  12.3*  NEUTROABS 5.5  --   --   HGB 5.7* 7.9* 7.4*  HCT 24.2*  --  29.3*  MCV 74.5*  --  78.1*  PLT 495*  --  484*    Basic  Metabolic Panel: Recent Labs  Lab 05/29/18 1052 05/30/18 0754  NA 137 139  K 4.7 4.7  CL 96* 98  CO2 35* 33*  GLUCOSE 231* 137*  BUN 40* 41*  CREATININE 1.78* 1.66*  CALCIUM 8.3* 8.2*  MG  --  2.3   GFR: Estimated Creatinine Clearance: 64.1 mL/min (A) (by C-G formula based on SCr of 1.66 mg/dL (H)). Recent Labs  Lab 05/29/18 1052 05/30/18 0754  WBC 8.3 12.3*    Liver Function Tests: Recent Labs  Lab 05/29/18 1052  AST 21  ALT 13  ALKPHOS 102  BILITOT 0.7  PROT 8.3*  ALBUMIN 2.8*   No results for input(s): LIPASE, AMYLASE in the last 168 hours. No results for input(s): AMMONIA in the last 168 hours.  ABG    Component Value Date/Time   PHART 7.239 (L) 05/30/2018 0848   PCO2ART 90.8 (HH) 05/30/2018 0848   PO2ART RBV 05/30/2018 0848   HCO3 37.5 (H) 05/30/2018 0848   TCO2 51.9 06/29/2015 0935   O2SAT 39.4 05/30/2018 0848     Coagulation Profile: Recent Labs  Lab 05/30/18 0126  INR 1.41    Cardiac Enzymes: Recent Labs  Lab 05/29/18 1052 05/30/18 0126 05/30/18 0754  TROPONINI <0.03 <0.03 <0.03    HbA1C: Hemoglobin A1C  Date/Time Value Ref Range Status  01/23/2018 02:46 PM 8.0 (A) 4.0 - 5.6 % Final  10/06/2016 04:43 PM 10.0  Final   HbA1c, POC (prediabetic range)  Date/Time Value Ref Range Status  01/23/2018 02:46 PM 8 (A) 5.7 - 6.4 % Final   HbA1c, POC (controlled diabetic range)  Date/Time Value Ref Range Status  01/23/2018 02:46 PM 8.0 (A) 0.0 - 7.0 % Final   HbA1c POC (<> result, manual entry)  Date/Time Value Ref Range Status  01/23/2018 02:46 PM 8 4.0 - 5.6 % Final   Hgb A1c MFr Bld  Date/Time Value Ref Range Status  05/21/2015 05:26 AM 7.9 (H) 4.8 - 5.6 % Final    Comment:    (NOTE)         Pre-diabetes: 5.7 - 6.4         Diabetes: >6.4         Glycemic control for adults with diabetes: <7.0   03/04/2014 09:52 PM 8.9 (H) <5.7 % Final    Comment:    (NOTE)                                                                         According to the ADA Clinical Practice Recommendations for 2011, when HbA1c is used as a screening test:  >=6.5%   Diagnostic of Diabetes Mellitus           (if abnormal result is confirmed) 5.7-6.4%   Increased risk of developing Diabetes Mellitus References:Diagnosis and Classification of Diabetes Mellitus,Diabetes ZCHY,8502,77(AJOIN 1):S62-S69 and Standards of Medical Care in         Diabetes - 2011,Diabetes Care,2011,34 (Suppl 1):S11-S61.    CBG: Recent Labs  Lab 05/29/18 1620 05/30/18 0031 05/30/18 0428 05/30/18 0801  GLUCAP 179* 189* 153* 122*    Review of Systems:   Review of Systems  Constitutional: Positive for malaise/fatigue. Negative for chills and fever.  HENT: Negative.   Eyes: Negative.   Respiratory: Positive for shortness of breath. Negative for cough, hemoptysis, sputum production and wheezing.   Cardiovascular: Positive for orthopnea and leg swelling. Negative for chest pain.  Gastrointestinal: Negative.   Genitourinary: Negative.   Musculoskeletal: Negative.   Skin: Negative.   Neurological: Negative.   Endo/Heme/Allergies: Negative.   Psychiatric/Behavioral: Negative.      Past Medical History  She,  has a past medical history of Asthma, Chronic combined systolic (congestive) and diastolic (congestive) heart failure ([place]), Chronic iron deficiency anemia, Diabetes mellitus without complication ([place]), and Hypertension.   Surgical History    Past Surgical History:  Procedure Laterality Date  . COLONOSCOPY WITH PROPOFOL N/A 08/03/2013   Procedure: COLONOSCOPY WITH PROPOFOL;  Surgeon: Jeryl Columbia, MD;  Location: [facility];  Service: Endoscopy;  Laterality: N/A;  . CORONARY ANGIOPLASTY WITH STENT PLACEMENT     CAD in 2006 x 2 and 2009 2 more- place din REx in [place] [facility]  . CORONARY ANGIOPLASTY WITH STENT PLACEMENT  10/07/2013   Xience [place] DES 2.75  mm x 15  mm  . ESOPHAGOGASTRODUODENOSCOPY (EGD) WITH PROPOFOL N/A 08/03/2013   Procedure:  ESOPHAGOGASTRODUODENOSCOPY (EGD) WITH PROPOFOL;  Surgeon: Jeryl Columbia, MD;  Location: [facility];  Service: Endoscopy;  Laterality: N/A;  . LEFT HEART CATHETERIZATION WITH CORONARY ANGIOGRAM N/A 10/07/2013   Procedure: LEFT HEART CATHETERIZATION WITH CORONARY ANGIOGRAM;  Surgeon: Leonie Man, MD;  Location: [facility] CATH LAB;  Service: Cardiovascular;  Laterality: N/A;     Social History   reports that she quit smoking about 4 years ago. Her smoking use included cigarettes. She has a 10.00 pack-year smoking history. She has never used smokeless tobacco. She reports that she does not drink alcohol or use drugs.   Family History   Her family history includes Asthma in her paternal aunt; Breast cancer in her paternal aunt; Colon cancer in her father; Diabetes in her father; Emphysema in her brother; Heart attack in her father; Hypertension in her mother; Lung disease in her brother; Sarcoidosis in an other family member; Stroke in her mother.   Allergies Allergies  Allergen Reactions  . Metolazone Other (See Comments)    Dizziness and falling  . Plavix [Clopidogrel Bisulfate] Other (See Comments)    High PRU's - non-responder  . Ibuprofen     Patient stated she can not take ibuprofen.   . Nsaids Other (See Comments)    "I do not take NSAIDs d/t interfering with other meds"     Home Medications  Prior to Admission medications   Medication Sig Start Date End Date Taking? Authorizing Provider  ACCU-CHEK AVIVA PLUS test strip USE 1 STRIP TO CHECK GLUCOSE 3 TIMES DAILY 03/30/18  Yes Fulp, Cammie, MD  ACCU-CHEK FASTCLIX LANCETS MISC 1 applicator by Does not apply route 4 (four) times daily -  before meals and at bedtime. 08/07/15  Yes Charlott Rakes, MD  ACCU-CHEK SOFTCLIX LANCETS lancets USE 1 LANCET TO CHECK GLUCOSE 3 TIMES DAILY 01/14/16  Yes Tresa Garter, MD  aspirin 81 MG tablet Take 1 tablet (81 mg total) by mouth daily. 10/09/13  Yes Barrett, Evelene Croon, PA-C  Blood Glucose Monitoring  Suppl (ACCU-CHEK AVIVA) device Use as instructed 3 times daily before meals. 08/06/15  Yes Newlin, Charlane Ferretti, MD  carvedilol (COREG) 12.5 MG tablet TAKE 1 TABLET BY MOUTH 2 TIMES A DAY WITH A MEAL **PT NEEDS TO SCHEDULE AN APPT WITH OUR OFFICE AT [phone] FOR FUTURE REFILLS 04/25/18  Yes Larey Dresser, MD  fluticasone (FLONASE) 50 MCG/ACT nasal spray INSTILL 2 SPRAYS IN EACH NOSTRIL TWICE DAILY. 04/08/17  Yes Tresa Garter, MD  furosemide (LASIX) 40 MG tablet Take 1.5 tablets (60 mg total) by mouth 2 (two) times daily. 02/24/18 02/24/19 Yes Fulp, Cammie, MD  Insulin Pen Needle (TRUEPLUS PEN NEEDLES) 32G X 4 MM MISC Use three times daily to inject insulin 02/02/18  Yes Fulp, Cammie, MD  INSULIN SYRINGE .5CC/29G 29G X 1/2" 0.5 ML MISC Use as instructed by physician 01/27/18  Yes Fulp, Cammie, MD  ipratropium-albuterol (DUONEB) 0.5-2.5 (3) MG/3ML SOLN Use nebulizer to inhale contents of one vial every 6 hours as needed for shortness of breath, wheezing 01/27/18  Yes Fulp, Cammie, MD  LEVEMIR 100 UNIT/ML injection INJECT 45 UNITS SUBCUTANEOUSLY EVERY NIGHT AT BEDTIME *MUST HAVE OFFICE VISIT FOR REFILLS* 02/24/18  Yes Fulp, Cammie, MD  losartan (COZAAR) 50 MG tablet Take 1 tablet (50 mg total) by mouth daily. 01/27/18  Yes Fulp, Cammie, MD  nitroGLYCERIN (NITROSTAT) 0.4 MG SL tablet Place 1 tablet (0.4 mg total) under the tongue every 5 (five) minutes as needed for chest pain. 01/27/18  Yes Fulp, Cammie, MD  NOVOLOG FLEXPEN 100 UNIT/ML FlexPen INJECT  15-20 UNITS SUBCUTANEOUSLY 3 TIMES DAILY WITH MEALS. **PATIENT MUST HAVE OFFICE VISIT FOR FUTURE REFILLS** 02/24/18  Yes Fulp, Cammie, MD  omeprazole (PRILOSEC) 40 MG capsule TAKE 1 CAPSULE BY MOUTH TWICE DAILY. 04/08/17  Yes Tresa Garter, MD  polyethylene glycol (MIRALAX / GLYCOLAX) packet Take 17 g by mouth daily. 01/27/18  Yes Fulp, Cammie, MD  prasugrel (EFFIENT) 10 MG TABS tablet TAKE ONE TABLET BY MOUTH DAILY. 05/29/18  Yes Bensimhon, Shaune Pascal, MD    rosuvastatin (CRESTOR) 10 MG tablet Take 1 tablet (10 mg total) by mouth daily. 01/27/18  Yes Fulp, Cammie, MD  spironolactone (ALDACTONE) 25 MG tablet Take 1 tablet (25 mg total) by mouth daily. 01/27/18  Yes Fulp, Cammie, MD  iron polysaccharides (NIFEREX) 150 MG capsule Take 1 capsule (150 mg total) by mouth daily. Patient not taking: Reported on 05/29/2018 01/27/18   Fulp, Cammie, MD  prazosin (MINIPRESS) 5 MG capsule TAKE 1 CAPSULE BY MOUTH EVERY NIGHT AT BEDTIME FOR NIGHTMARES. Patient not taking: Reported on 05/29/2018 03/30/18   Fulp, Ander Gaster, MD  PROAIR HFA 108 (90 Base) MCG/ACT inhaler INHALE 2 PUFFS BY MOUTH 4 TIMES DAILY AS NEEDED Patient not taking: Reported on 05/29/2018 02/24/18   Fulp, Ander Gaster, MD  senna (SENOKOT) 8.6 MG TABS tablet Take 1 tablet (8.6 mg total) by mouth 2 (two) times daily. Patient not taking: Reported on 05/29/2018 01/18/18   Doreatha Lew, MD     Critical care time: 45 minutes     Erick Colace ACNP-BC [facility] Pager # [phone] OR # [phone] if no answer

## 2018-05-30 NOTE — Progress Notes (Signed)
Per RN- CCM removed PT from BiPAP.

## 2018-05-30 NOTE — Consult Note (Signed)
Cardiology Consultation:   Patient ID: Tamara Silva MRN: [id number]; DOB: 1961/01/28  Admit date: 05/29/2018 Date of Consult: 05/30/2018  Primary Care Provider: Antony Blackbird, MD Primary Cardiologist: Loralie Champagne, MD ACHF Primary Electrophysiologist:  None    Patient Profile:   Tamara Silva is a 57 y.o. female with a hx of COPD on home 02, DM-2, CAD with PCI at [facility] 2006 and [facility] in 2008 likely to the LAD, STEMI inf wall with DES to RCA 2015  HTN, HLD, h/o of chronic LLL PE, chromic anemia, and chronic systolic HF   who is being seen today for the evaluation of chronic CHF With admit for acute anemia with Hgb 5.7 at the request of Dr. Tyrell Antonio.  History of Present Illness:   Tamara Silva with above hx and prior stent to LAD and then STEMI to RCA in 2015 with DES.  She had low response to plavix so on Effient.  Has not been seen in HF since 2018.  Last echo 12/2017 with EF 35-40% G2DD, LA moderately dilated. This was stable from 09/21/16.  (EF with cath was 30% with acute process)  Pt admitted 05/30/18 with acute anemia with Hgb at 5.7.   EKG SR with low QRS and Q waves in ant leads--similar to prior EKGs but lower QRS.  I personally reviewed.  Troponins <0.03 X 3 BNP 1218 Hgb 5.7, WBC 8.3  plts 495 now hgb at 7.4 after transfusion of 2 UPRBCs Na 139, K+ 4.7 BUN 41 and Cr 1.66  TSH 1.816  CT of abd  IMPRESSION: 1. No evidence of bowel obstruction or acute bowel inflammation. Mild sigmoid diverticulosis, with no evidence of acute diverticulitis. 2. Small to moderate volume ascites. Moderate anasarca. 3. Mild patchy consolidation in the peripheral right middle lobe. Patchy tree-in-bud opacity in the dependent right lower lobe. Findings suggest a mild bronchopneumonia, with the differential including aspiration. Suggest attention on follow-up chest imaging in 3 months. 4. Diffuse hepatic steatosis. 5.  Aortic Atherosclerosis (ICD10-I70.0).  PCXR  IMPRESSION: Diffuse  right-sided airspace opacification may reflect asymmetric pulmonary edema or pneumonia. Underlying aspiration cannot be excluded, given clinical concern, though the pattern is not particularly indicative of aspiration  Pt had an acute decompensation after blood transfused.   Currently on lasix 80 mg BID she is neg 548 and wt down from 184.2 kg to 180.5 kg. Coreg reduced from 12.5 to 3.125 BID. Losartan on hold, aldactone on hold.  ASA and effient on hold with acute anemia.   Pt on CPAP/BIpap- very sleepy, difficult to waken though she does open eyes.   Past Medical History:  Diagnosis Date  . Asthma   . Chronic combined systolic (congestive) and diastolic (congestive) heart failure ([place])   . Chronic iron deficiency anemia   . Diabetes mellitus without complication ([place])   . Hypertension     Past Surgical History:  Procedure Laterality Date  . COLONOSCOPY WITH PROPOFOL N/A 08/03/2013   Procedure: COLONOSCOPY WITH PROPOFOL;  Surgeon: Jeryl Columbia, MD;  Location: [facility] END[facility]COPY;  Service: Endoscopy;  Laterality: N/A;  . CORONARY ANGIOPLASTY WITH STENT PLACEMENT     CAD in 2006 x 2 and 2009 2 more- place din REx in [facility]  . CORONARY ANGIOPLASTY WITH STENT PLACEMENT  10/07/2013   [facility] [place] DES 2.75  mm x 15  mm  . ESOPHAGOGASTRODUODENOSCOPY (EGD) WITH PROPOFOL N/A 08/03/2013   Procedure: ESOPHAGOGASTRODUODENOSCOPY (EGD) WITH PROPOFOL;  Surgeon: Jeryl Columbia, MD;  Location: [facility];  Service: Endoscopy;  Laterality: N/A;  . LEFT HEART CATHETERIZATION WITH CORONARY ANGIOGRAM N/A 10/07/2013   Procedure: LEFT HEART CATHETERIZATION WITH CORONARY ANGIOGRAM;  Surgeon: Leonie Man, MD;  Location: [facility] CATH LAB;  Service: Cardiovascular;  Laterality: N/A;     Home Medications:  Prior to Admission medications   Medication Sig Start Date End Date Taking? Authorizing Provider  ACCU-CHEK AVIVA PLUS test strip USE 1 STRIP TO CHECK GLUCOSE 3 TIMES DAILY 03/30/18  Yes Fulp, Cammie,  MD  ACCU-CHEK FASTCLIX LANCETS MISC 1 applicator by Does not apply route 4 (four) times daily -  before meals and at bedtime. 08/07/15  Yes Charlott Rakes, MD  ACCU-CHEK SOFTCLIX LANCETS lancets USE 1 LANCET TO CHECK GLUCOSE 3 TIMES DAILY 01/14/16  Yes Tresa Garter, MD  aspirin 81 MG tablet Take 1 tablet (81 mg total) by mouth daily. 10/09/13  Yes Barrett, Evelene Croon, PA-C  Blood Glucose Monitoring Suppl (ACCU-CHEK AVIVA) device Use as instructed 3 times daily before meals. 08/06/15  Yes Newlin, Charlane Ferretti, MD  carvedilol (COREG) 12.5 MG tablet TAKE 1 TABLET BY MOUTH 2 TIMES A DAY WITH A MEAL **PT NEEDS TO SCHEDULE AN APPT WITH OUR OFFICE AT [phone] FOR FUTURE REFILLS 04/25/18  Yes Larey Dresser, MD  fluticasone (FLONASE) 50 MCG/ACT nasal spray INSTILL 2 SPRAYS IN EACH NOSTRIL TWICE DAILY. 04/08/17  Yes Tresa Garter, MD  furosemide (LASIX) 40 MG tablet Take 1.5 tablets (60 mg total) by mouth 2 (two) times daily. 02/24/18 02/24/19 Yes Fulp, Cammie, MD  Insulin Pen Needle (TRUEPLUS PEN NEEDLES) 32G X 4 MM MISC Use three times daily to inject insulin 02/02/18  Yes Fulp, Cammie, MD  INSULIN SYRINGE .5CC/29G 29G X 1/2" 0.5 ML MISC Use as instructed by physician 01/27/18  Yes Fulp, Cammie, MD  ipratropium-albuterol (DUONEB) 0.5-2.5 (3) MG/3ML SOLN Use nebulizer to inhale contents of one vial every 6 hours as needed for shortness of breath, wheezing 01/27/18  Yes Fulp, Cammie, MD  LEVEMIR 100 UNIT/ML injection INJECT 45 UNITS SUBCUTANEOUSLY EVERY NIGHT AT BEDTIME *MUST HAVE OFFICE VISIT FOR REFILLS* 02/24/18  Yes Fulp, Cammie, MD  losartan (COZAAR) 50 MG tablet Take 1 tablet (50 mg total) by mouth daily. 01/27/18  Yes Fulp, Cammie, MD  nitroGLYCERIN (NITROSTAT) 0.4 MG SL tablet Place 1 tablet (0.4 mg total) under the tongue every 5 (five) minutes as needed for chest pain. 01/27/18  Yes Fulp, Cammie, MD  NOVOLOG FLEXPEN 100 UNIT/ML FlexPen INJECT 15-20 UNITS SUBCUTANEOUSLY 3 TIMES DAILY WITH MEALS.  **PATIENT MUST HAVE OFFICE VISIT FOR FUTURE REFILLS** 02/24/18  Yes Fulp, Cammie, MD  omeprazole (PRILOSEC) 40 MG capsule TAKE 1 CAPSULE BY MOUTH TWICE DAILY. 04/08/17  Yes Tresa Garter, MD  polyethylene glycol (MIRALAX / GLYCOLAX) packet Take 17 g by mouth daily. 01/27/18  Yes Fulp, Cammie, MD  prasugrel (EFFIENT) 10 MG TABS tablet TAKE ONE TABLET BY MOUTH DAILY. 05/29/18  Yes Bensimhon, Shaune Pascal, MD  rosuvastatin (CRESTOR) 10 MG tablet Take 1 tablet (10 mg total) by mouth daily. 01/27/18  Yes Fulp, Cammie, MD  spironolactone (ALDACTONE) 25 MG tablet Take 1 tablet (25 mg total) by mouth daily. 01/27/18  Yes Fulp, Cammie, MD  iron polysaccharides (NIFEREX) 150 MG capsule Take 1 capsule (150 mg total) by mouth daily. Patient not taking: Reported on 05/29/2018 01/27/18   Fulp, Cammie, MD  prazosin (MINIPRESS) 5 MG capsule TAKE 1 CAPSULE BY MOUTH EVERY NIGHT AT BEDTIME FOR NIGHTMARES. Patient not taking: Reported on 05/29/2018 03/30/18  Fulp, Cammie, MD  PROAIR HFA 108 (90 Base) MCG/ACT inhaler INHALE 2 PUFFS BY MOUTH 4 TIMES DAILY AS NEEDED Patient not taking: Reported on 05/29/2018 02/24/18   Fulp, Ander Gaster, MD  senna (SENOKOT) 8.6 MG TABS tablet Take 1 tablet (8.6 mg total) by mouth 2 (two) times daily. Patient not taking: Reported on 05/29/2018 01/18/18   Patrecia Pour, Christean Grief, MD    Inpatient Medications: Scheduled Meds: . carvedilol  3.125 mg Oral BID WC  . furosemide      . furosemide  80 mg Intravenous Q8H  . insulin aspart  0-15 Units Subcutaneous Q4H  . mouth rinse  15 mL Mouth Rinse BID  . pantoprazole (PROTONIX) IV  40 mg Intravenous Q12H   Continuous Infusions: . sodium chloride    . piperacillin-tazobactam (ZOSYN)  IV 12.5 mL/hr at 05/30/18 0600   PRN Meds: sodium chloride, albuterol, hydrALAZINE, ondansetron **OR** ondansetron (ZOFRAN) IV  Allergies:    Allergies  Allergen Reactions  . Metolazone Other (See Comments)    Dizziness and falling  . Plavix [Clopidogrel  Bisulfate] Other (See Comments)    High PRU's - non-responder  . Ibuprofen     Patient stated she can not take ibuprofen.   . Nsaids Other (See Comments)    "I do not take NSAIDs d/t interfering with other meds"    Social History:   Social History   Socioeconomic History  . Marital status: Single    Spouse name: Not on file  . Number of children: 1  . Years of education: Not on file  . Highest education level: Not on file  Occupational History  . Not on file  Social Needs  . Financial resource strain: Somewhat hard  . Food insecurity:    Worry: Sometimes true    Inability: Sometimes true  . Transportation needs:    Medical: Yes    Non-medical: Yes  Tobacco Use  . Smoking status: Former Smoker    Packs/day: 0.50    Years: 20.00    Pack years: 10.00    Types: Cigarettes    Last attempt to quit: 04/30/2014    Years since quitting: 4.0  . Smokeless tobacco: Never Used  . Tobacco comment: smoked off and on x 20 years  Substance and Sexual Activity  . Alcohol use: No    Alcohol/week: 0.0 standard drinks  . Drug use: No  . Sexual activity: Never  Lifestyle  . Physical activity:    Days per week: 0 days    Minutes per session: 0 min  . Stress: Rather much  Relationships  . Social connections:    Talks on phone: Twice a week    Gets together: Once a week    Attends religious service: 1 to 4 times per year    Active member of club or organization: No    Attends meetings of clubs or organizations: Never    Relationship status: Living with partner  . Intimate partner violence:    Fear of current or ex partner: Not on file    Emotionally abused: Not on file    Physically abused: Not on file    Forced sexual activity: Not on file  Other Topics Concern  . Not on file  Social History Narrative   Origibnally from [place]   Most recently from [place]   Daughter lives in town      On Social security to CHF, COPD, CAD    Family History:    Family History  Problem  Relation Age of Onset  . Stroke Mother   . Hypertension Mother   . Colon cancer Father   . Diabetes Father   . Heart attack Father   . Sarcoidosis Other   . Breast cancer Paternal Aunt   . Emphysema Brother   . Lung disease Brother        Unknown type, 3 brothers, one with liver and lung disease  . Asthma Paternal Aunt      ROS: pt very sleepy unable to get answers info from H&P  Please see the history of present illness.  General:no colds or fevers, no weight changes Skin:no rashes or ulcers HEENT:no blurred vision, no congestion CV:see HPI PUL:see HPI GI:no diarrhea constipation or melena, no indigestion GU:no hematuria, no dysuria MS:no joint pain, no claudication Neuro:no syncope, no lightheadedness Endo:no diabetes, no thyroid disease  All other ROS reviewed and negative.     Physical Exam/Data:   Vitals:   05/30/18 0500 05/30/18 0600 05/30/18 0800 05/30/18 0958  BP: (!) 116/56 139/79 138/69   Pulse: 72 77 72 69  Resp: (!) 27 (!) 21 (!) 21 20  Temp: 99.9 F (37.7 C) 100 F (37.8 C) 99.9 F (37.7 C)   TempSrc: Core     SpO2: 100% 99% 100% 100%  Weight:      Height:        Intake/Output Summary (Last 24 hours) at 05/30/2018 1044 Last data filed at 05/30/2018 0927 Gross per 24 hour  Intake 376.54 ml  Output 925 ml  Net -548.46 ml   Filed Weights   05/29/18 0823 05/29/18 1600 05/30/18 0107  Weight: (!) 145.2 kg (!) 184.2 kg (!) 180.5 kg   Body mass index is 61.42 kg/m.  General:  Well nourished, well developed, in no acute distress with bipap and sleeping HEENT: normal Lymph: no adenopathy Neck: no JVD sitting up at 45 degrees Endocrine:  No thryomegaly Vascular: No carotid bruits; pedal pulses 2+ bilaterally without bruits SCDs in place Cardiac:  normal S1, S2; RRR; no murmur gallup rub or click Lungs:  clear to auscultation bilaterally, no wheezing, rhonchi or rales - ant. Abd: soft, nontender, no hepatomegaly + ascites Ext: no to tr  edema Musculoskeletal:  No deformities, BUE and BLE strength normal and equal Skin: warm and dry  Neuro:  Lethargic, wakes to name called.  Does not currently answer questions falls back to sleep , no focal abnormalities noted Psych:  Normal affect     Relevant CV Studies: Echo 12/2017 Study Conclusions  - Left ventricle: The cavity size was mildly dilated. There was   mild concentric hypertrophy. Systolic function was moderately   reduced. The estimated ejection fraction was in the range of 35%   to 40%. There is akinesis of the basal and mid anteroseptal,   anterior walls and all of the apical walls. Paradoxical septal   motion. Features are consistent with a pseudonormal left   ventricular filling pattern, with concomitant abnormal relaxation   and increased filling pressure (grade 2 diastolic dysfunction).   Doppler parameters are consistent with elevated ventricular   end-diastolic filling pressure. - Aortic valve: There was no regurgitation. - Left atrium: The atrium was moderately dilated. - Right ventricle: The cavity size was moderately decreased. Wall   thickness was normal. Systolic function was moderately reduced. - Tricuspid valve: There was mild regurgitation. - Pulmonic valve: There was no regurgitation. - Pulmonary arteries: Systolic pressure was within the normal   range.  Impressions:  - There  is no significant difference from the prior study on   09/21/2016.   Laboratory Data:  Chemistry Recent Labs  Lab 05/29/18 1052 05/30/18 0754  NA 137 139  K 4.7 4.7  CL 96* 98  CO2 35* 33*  GLUCOSE 231* 137*  BUN 40* 41*  CREATININE 1.78* 1.66*  CALCIUM 8.3* 8.2*  GFRNONAA 31* 34*  GFRAA 36* 39*  ANIONGAP 6 8    Recent Labs  Lab 05/29/18 1052  PROT 8.3*  ALBUMIN 2.8*  AST 21  ALT 13  ALKPHOS 102  BILITOT 0.7   Hematology Recent Labs  Lab 05/29/18 1052 05/29/18 1237 05/30/18 0126 05/30/18 0754  WBC 8.3  --   --  12.3*  RBC 3.25* 3.25*   --  3.75*  HGB 5.7*  --  7.9* 7.4*  HCT 24.2*  --   --  29.3*  MCV 74.5*  --   --  78.1*  MCH 17.5*  --   --  19.7*  MCHC 23.6*  --   --  25.3*  RDW 26.0*  --   --  27.1*  PLT 495*  --   --  484*   Cardiac Enzymes Recent Labs  Lab 05/29/18 1052 05/30/18 0126 05/30/18 0754  TROPONINI <0.03 <0.03 <0.03   No results for input(s): TROPIPOC in the last 168 hours.  BNP Recent Labs  Lab 05/29/18 1052  BNP 1,218.8*    DDimer No results for input(s): DDIMER in the last 168 hours.  Radiology/Studies:  Ct Abdomen Pelvis Wo Contrast  Result Date: 05/29/2018 CLINICAL DATA:  Abdominal distension. Severe anemia. GI bleed. EXAM: CT ABDOMEN AND PELVIS WITHOUT CONTRAST TECHNIQUE: Multidetector CT imaging of the abdomen and pelvis was performed following the standard protocol without IV contrast. COMPARISON:  06/23/2015 abdominal sonogram. FINDINGS: Lower chest: Mild patchy consolidation in peripheral right middle lobe. Patchy tree-in-bud opacity in the dependent right lower lobe. Parenchymal banding at both lung bases compatible with postinfectious/postinflammatory scarring. Mild cardiomegaly. Right coronary atherosclerosis. Posterior left pleural coarse calcification. Hepatobiliary: Diffuse hepatic steatosis. No definite liver surface irregularity. No liver mass. Contracted gallbladder with no radiopaque cholelithiasis. No biliary ductal dilatation. Pancreas: Normal, with no mass or duct dilation. Spleen: Normal size. No mass. Adrenals/Urinary Tract: Normal adrenals. No hydronephrosis. No renal stones. No contour deforming renal masses. Normal bladder. Stomach/Bowel: Mild gaseous distention of the stomach with gastric fluid level. No definite gastric wall thickening. Normal caliber small bowel with no small bowel wall thickening. Candidate appendix appears normal. Mild sigmoid diverticulosis, with no large bowel wall thickening or significant pericolonic fat stranding. Vascular/Lymphatic: Atherosclerotic  nonaneurysmal abdominal aorta. No pathologically enlarged lymph nodes in the abdomen or pelvis. Reproductive: Grossly normal uterus.  No adnexal mass. Other: No pneumoperitoneum. Small to moderate volume ascites. No focal fluid collection. Moderate anasarca. Musculoskeletal: No aggressive appearing focal osseous lesions. IMPRESSION: 1. No evidence of bowel obstruction or acute bowel inflammation. Mild sigmoid diverticulosis, with no evidence of acute diverticulitis. 2. Small to moderate volume ascites. Moderate anasarca. 3. Mild patchy consolidation in the peripheral right middle lobe. Patchy tree-in-bud opacity in the dependent right lower lobe. Findings suggest a mild bronchopneumonia, with the differential including aspiration. Suggest attention on follow-up chest imaging in 3 months. 4. Diffuse hepatic steatosis. 5.  Aortic Atherosclerosis (ICD10-I70.0). Electronically Signed   By: Ilona Sorrel M.D.   On: 05/29/2018 23:52   Dg Chest Port 1 View  Result Date: 05/30/2018 CLINICAL DATA:  Assess for congestive heart failure. Concern for aspiration pneumonia. EXAM: PORTABLE  CHEST 1 VIEW COMPARISON:  Chest radiograph performed 01/13/2018 FINDINGS: Diffuse right-sided airspace opacification may reflect asymmetric pulmonary edema or pneumonia. Underlying aspiration cannot be excluded, though the pattern is not particularly indicative of aspiration. No significant pleural effusion or pneumothorax is seen. The cardiomediastinal silhouette is borderline normal in size. No acute osseous abnormalities are identified. IMPRESSION: Diffuse right-sided airspace opacification may reflect asymmetric pulmonary edema or pneumonia. Underlying aspiration cannot be excluded, given clinical concern, though the pattern is not particularly indicative of aspiration. Electronically Signed   By: Garald Balding M.D.   On: 05/30/2018 05:37    Assessment and Plan:   1. Acute systolic HF, after blood transfusion.  Diuresing - on lasix  80 mg IV BID.  Neg 585 now - + CT of abd with ascites.  Continue to diuresis - repeat echo.  Coreg has been decreased due to acute exacerbation.  Resume outpt meds as tolerated depending on labs and condition. Dr. Harrell Gave to see.  2. Acute respiratory failure due to HF.  On BiPAP per CCM  3. CAD with last stent in 2015, when stable would add back ASA but hold effient.    Neg troponins 4. Lethargic, for head CT today 5. CKD 3 monitor Cr.       For questions or updates, please contact [place] Please consult [email or web address] for contact info under     Signed, Cecilie Kicks, NP  05/30/2018 10:44 AM

## 2018-05-30 NOTE — Progress Notes (Signed)
SLP Cancellation Note  Patient Details Name: Tamara Silva MRN: [id number] DOB: [birth year]   Cancelled treatment:       Reason Eval/Treat Not Completed: Medical issues which prohibited therapy;; pt on BiPAP, not appropriate for swallowing assessment.  Will continue efforts. D/W RN.  Edmonia Gonser L. Tivis Ringer, [place] Office number [phone] Pager [phone]  Tamara Silva 05/30/2018, 10:06 AM

## 2018-05-30 NOTE — Progress Notes (Signed)
RN called requesting PT be placed back on BiPAP. RT arrived PT was off BiPAP. PT was then placed back on BiPAP- RN aware.

## 2018-05-30 NOTE — Progress Notes (Signed)
PHARMACY NOTE -  [facility] has been assisting with dosing of Zosyn for aspiration PNA.  Dosage remains stable at 3.375 g IV q8 hr and need for further dosage adjustment appears unlikely at present given adequate renal function  The selected regimen does not require renal adjustment  Pharmacy will sign off, following peripherally for culture results or dose adjustments. Please reconsult if a change in clinical status warrants re-evaluation of dosage.  Reuel Boom, PharmD, BCPS [phone] 05/30/2018, 7:40 AM

## 2018-05-31 ENCOUNTER — Inpatient Hospital Stay (HOSPITAL_COMMUNITY): Payer: Medicaid Other

## 2018-05-31 ENCOUNTER — Encounter (HOSPITAL_COMMUNITY): Payer: Self-pay | Admitting: Radiology

## 2018-05-31 DIAGNOSIS — I639 Cerebral infarction, unspecified: Secondary | ICD-10-CM

## 2018-05-31 DIAGNOSIS — I5042 Chronic combined systolic (congestive) and diastolic (congestive) heart failure: Secondary | ICD-10-CM

## 2018-05-31 DIAGNOSIS — I5023 Acute on chronic systolic (congestive) heart failure: Secondary | ICD-10-CM

## 2018-05-31 DIAGNOSIS — R569 Unspecified convulsions: Secondary | ICD-10-CM

## 2018-05-31 HISTORY — DX: Cerebral infarction, unspecified: I63.9

## 2018-05-31 HISTORY — DX: Unspecified convulsions: R56.9

## 2018-05-31 LAB — BASIC METABOLIC PANEL
Anion gap: 10 (ref 5–15)
BUN: 39 mg/dL — ABNORMAL HIGH (ref 6–20)
CO2: 35 mmol/L — ABNORMAL HIGH (ref 22–32)
Calcium: 8.2 mg/dL — ABNORMAL LOW (ref 8.9–10.3)
Chloride: 96 mmol/L — ABNORMAL LOW (ref 98–111)
Creatinine, Ser: 1.7 mg/dL — ABNORMAL HIGH (ref 0.44–1.00)
GFR calc Af Amer: 38 mL/min — ABNORMAL LOW (ref >60)
GFR calc non Af Amer: 33 mL/min — ABNORMAL LOW (ref >60)
Glucose, Bld: 89 mg/dL (ref 70–99)
Potassium: 4.3 mmol/L (ref 3.5–5.1)
Sodium: 141 mmol/L (ref 135–145)

## 2018-05-31 LAB — CBC
HCT: 28.7 % — ABNORMAL LOW (ref 36.0–46.0)
Hemoglobin: 7.3 g/dL — ABNORMAL LOW (ref 12.0–15.0)
MCH: 19.5 pg — ABNORMAL LOW (ref 26.0–34.0)
MCHC: 25.4 g/dL — ABNORMAL LOW (ref 30.0–36.0)
MCV: 76.7 fL — ABNORMAL LOW (ref 80.0–100.0)
Platelets: 525 10*3/uL — ABNORMAL HIGH (ref 150–400)
RBC: 3.74 MIL/uL — ABNORMAL LOW (ref 3.87–5.11)
RDW: 28.2 % — ABNORMAL HIGH (ref 11.5–15.5)
WBC: 11.7 10*3/uL — ABNORMAL HIGH (ref 4.0–10.5)
nRBC: 0.8 % — ABNORMAL HIGH (ref 0.0–0.2)

## 2018-05-31 LAB — GLUCOSE, CAPILLARY
GLUCOSE-CAPILLARY: 91 mg/dL (ref 70–99)
Glucose-Capillary: 92 mg/dL (ref 70–99)
Glucose-Capillary: 85 mg/dL (ref 70–99)
Glucose-Capillary: 88 mg/dL (ref 70–99)
Glucose-Capillary: 103 mg/dL — ABNORMAL HIGH (ref 70–99)
Glucose-Capillary: 136 mg/dL — ABNORMAL HIGH (ref 70–99)
Glucose-Capillary: 123 mg/dL — ABNORMAL HIGH (ref 70–99)

## 2018-05-31 LAB — BLOOD GAS, ARTERIAL
ACID-BASE EXCESS: 12.3 mmol/L — AB (ref 0.0–2.0)
Bicarbonate: 38.3 mmol/L — ABNORMAL HIGH (ref 20.0–28.0)
Drawn by: 270211
O2 Content: 3 L/min
O2 Saturation: 87.8 %
Patient temperature: 98.6
pCO2 arterial: 65 mmHg — ABNORMAL HIGH (ref 32.0–48.0)
pH, Arterial: 7.388 (ref 7.350–7.450)
pO2, Arterial: 54.4 mmHg — ABNORMAL LOW (ref 83.0–108.0)

## 2018-05-31 LAB — ECHOCARDIOGRAM COMPLETE
Height: 67.5 in
Weight: 5280 oz

## 2018-05-31 MED ORDER — SODIUM CHLORIDE 0.9 % IV SOLN
510.0000 mg | Freq: Once | INTRAVENOUS | Status: AC
  Administered 2018-05-31: 510 mg via INTRAVENOUS
  Filled 2018-05-31: qty 17

## 2018-05-31 MED ORDER — SODIUM CHLORIDE 0.9 % IV SOLN
1.0000 g | INTRAVENOUS | Status: DC
  Administered 2018-05-31: 1 g via INTRAVENOUS
  Filled 2018-05-31: qty 1
  Filled 2018-05-31: qty 10

## 2018-05-31 MED ORDER — PERFLUTREN LIPID MICROSPHERE
1.0000 mL | INTRAVENOUS | Status: AC | PRN
  Administered 2018-05-31: 4 mL via INTRAVENOUS
  Filled 2018-05-31: qty 10

## 2018-05-31 MED ORDER — STROKE: EARLY STAGES OF RECOVERY BOOK
Freq: Once | Status: AC
  Administered 2018-05-31: 17:00:00
  Filled 2018-05-31: qty 1

## 2018-05-31 NOTE — Consult Note (Signed)
Neurology Consultation Reason for Consult: Stroke Referring Physician: Tyrell Antonio, B  CC: Confusion  History is obtained from: Chart review, boyfriend  HPI: Tamara Silva is a 58 y.o. female who was admitted on 12/30 with generalized weakness in the setting of acute on chronic iron deficiency anemia.  At that time she had a hemoglobin of 5.3.  She was given blood transfusion and developed acute decompensation of her CHF.  She was noted to be lethargic and have slurred speech and had a head CT done as work-up for this which reveals a small cortically based right occipital parietal convexity stroke.  Her boyfriend states that she has been confused for at least a couple of days, but is not sure of the exact time of onset  LKW: 12/29 tpa given?: no, outside of window    ROS: Unable to obtain due to altered mental status.   Past Medical History:  Diagnosis Date  . Asthma   . Chronic combined systolic (congestive) and diastolic (congestive) heart failure ([place])   . Chronic iron deficiency anemia   . Diabetes mellitus without complication ([place])   . Hypertension      Family History  Problem Relation Age of Onset  . Stroke Mother   . Hypertension Mother   . Colon cancer Father   . Diabetes Father   . Heart attack Father   . Sarcoidosis Other   . Breast cancer Paternal Aunt   . Emphysema Brother   . Lung disease Brother        Unknown type, 3 brothers, one with liver and lung disease  . Asthma Paternal Aunt      Social History:  reports that she quit smoking about 4 years ago. Her smoking use included cigarettes. She has a 10.00 pack-year smoking history. She has never used smokeless tobacco. She reports that she does not drink alcohol or use drugs.   Exam: Current vital signs: BP (!) 150/69   Pulse 82   Temp 99.9 F (37.7 C)   Resp (!) 29   Ht 5' 7.5" (1.715 m)   Wt (!) 149.7 kg   LMP 07/28/2013 Comment: perimenopausal  SpO2 99%   BMI 50.92 kg/m  Vital signs in  last 24 hours: Temp:  [99.7 F (37.6 C)-100.2 F (37.9 C)] 99.9 F (37.7 C) (01/01 1600) Pulse Rate:  [73-84] 82 (01/01 1600) Resp:  [18-33] 29 (01/01 1600) BP: (124-163)/(46-76) 150/69 (01/01 1600) SpO2:  [93 %-100 %] 99 % (01/01 1600) Weight:  [149.7 kg] 149.7 kg (01/01 0500)   Physical Exam  Constitutional: Appears obese Psych: Affect appropriate to situation Eyes: No scleral injection HENT: No OP obstrucion Head: Normocephalic.  Cardiovascular: Normal rate and regular rhythm.  Respiratory: Effort normal, non-labored breathing GI: Soft.  No distension. There is no tenderness.  Skin: WDI  Neuro: Mental Status: Patient is awake, alert, she is extremely slow to answer questions and has some perseveration.  She has some difficulty following even relatively simple commands. Cranial Nerves: II: She endorses seeing fingers wiggle in all 4 visual fields, has difficulty with counting fingers in any visual field. Pupils are equal, round, and reactive to light.   III,IV, VI: EOMI without ptosis or diploplia.  V: Facial sensation is symmetric to temperature VII: Facial movement is symmetric.  VIII: hearing is intact to voice X: Uvula elevates symmetrically XI: Shoulder shrug is symmetric. XII: tongue is midline without atrophy or fasciculations.  Motor: I suspect that she has some motor neglect as she does not  give as good effort on the left side, but I am not sure if this is true weakness versus motor neglect as she does not have significant pronator drift, she is at least 4/5 in both the left arm and leg Sensory: Sensation is symmetric to light touch and temperature in the arms and legs.  She does not extinguish to double simultaneous stimulation Cerebellar: She has difficulty following commands for finger-nose-finger, but no clear ataxia  I have reviewed labs in epic and the results pertinent to this consultation are: BMP-creatinine 1.7 B12 91 TSH 1.81  I have reviewed the  images obtained: CT head- small cortically based stroke in the right parietal region  Impression: 58 year old female with what is likely an embolic infarct on CT.  I do not feel like this infarct explains the extent of her acute encephalopathy and suspect that it is multifactorial due to AKI, acute on chronic heart failure, acute anemia, hypoxic respiratory failure.  Also possible would be that she has had multifocal embolic events only one of which was seen well on CT scan.  Recommendations: - HgbA1c, fasting lipid panel - MRI, MRA  of the brain without contrast - Frequent neuro checks - Echocardiogram - Carotid dopplers -Okay to avoid antithrombotics in the setting of acute blood loss anemia - Risk factor modification - Telemetry monitoring - PT consult, OT consult, Speech consult -Ammonia - Stroke team to follow   Roland Rack, MD [facility] [phone]  If 7pm- 7am, please page neurology on call as listed in [place].

## 2018-05-31 NOTE — Evaluation (Signed)
Clinical/Bedside Swallow Evaluation Patient Details  Name: Tamara Silva MRN: [id number] Date of Birth: [birth year]  Today's Date: 05/31/2018 Time: SLP Start Time (ACUTE ONLY): 0630 SLP Stop Time (ACUTE ONLY): 1305 SLP Time Calculation (min) (ACUTE ONLY): 19 min  Past Medical History:  Past Medical History:  Diagnosis Date  . Asthma   . Chronic combined systolic (congestive) and diastolic (congestive) heart failure ([place])   . Chronic iron deficiency anemia   . Diabetes mellitus without complication ([place])   . Hypertension    Past Surgical History:  Past Surgical History:  Procedure Laterality Date  . COLONOSCOPY WITH PROPOFOL N/A 08/03/2013   Procedure: COLONOSCOPY WITH PROPOFOL;  Surgeon: Jeryl Columbia, MD;  Location: [facility];  Service: Endoscopy;  Laterality: N/A;  . CORONARY ANGIOPLASTY WITH STENT PLACEMENT     CAD in 2006 x 2 and 2009 2 more- place din REx in [place] and [place] [facility]  . CORONARY ANGIOPLASTY WITH STENT PLACEMENT  10/07/2013   [facility] [place] DES 2.75  mm x 15  mm  . ESOPHAGOGASTRODUODENOSCOPY (EGD) WITH PROPOFOL N/A 08/03/2013   Procedure: ESOPHAGOGASTRODUODENOSCOPY (EGD) WITH PROPOFOL;  Surgeon: Jeryl Columbia, MD;  Location: [facility];  Service: Endoscopy;  Laterality: N/A;  . LEFT HEART CATHETERIZATION WITH CORONARY ANGIOGRAM N/A 10/07/2013   Procedure: LEFT HEART CATHETERIZATION WITH CORONARY ANGIOGRAM;  Surgeon: Leonie Man, MD;  Location: [facility] CATH LAB;  Service: Cardiovascular;  Laterality: N/A;   HPI:  58 y.o. female with medical history significant for chronic iron deficiency anemia with baseline hemoglobin 8-9, type 2 diabetes mellitus, chronic combined systolic/diastolic heart failure, stage III chronic kidney disease with baseline creatinine of 1.2, who is admitted for observation to [facility] long hospital on 05/29/2018 with acute on chronic iron deficiency anemia after presenting from home to [facility] long emergency department complaining of generalized weakness.   Dx acute hypoxic resp failure, acute on chronic heart failure, anemia, encephalopathy.    Assessment / Plan / Recommendation Clinical Impression  Oropharyngeal swallow function appears/suspected to be grossly within functional limits. Pt eager for PO and consumed thin, puree, and solid consistencies without overt difficulty. Aspiration risk remains increased in setting of respiratory decline and encephalopathy. Recommend regular thin liquid diet with safe swallow precatuions for reduced aspiration risk. No further ST needs identified.  SLP Visit Diagnosis: Dysphagia, unspecified (R13.10)    Aspiration Risk  Moderate aspiration risk;Mild aspiration risk    Diet Recommendation     Medication Administration: Whole meds with liquid    Other  Recommendations Oral Care Recommendations: Oral care BID   Follow up Recommendations        Frequency and Duration            Prognosis Prognosis for Safe Diet Advancement: Good      Swallow Study   General Date of Onset: 05/29/18 HPI: 58 y.o. female with medical history significant for chronic iron deficiency anemia with baseline hemoglobin 8-9, type 2 diabetes mellitus, chronic combined systolic/diastolic heart failure, stage III chronic kidney disease with baseline creatinine of 1.2, who is admitted for observation to [facility] long hospital on 05/29/2018 with acute on chronic iron deficiency anemia after presenting from home to [facility] long emergency department complaining of generalized weakness.  Dx acute hypoxic resp failure, acute on chronic heart failure, anemia, encephalopathy.  Type of Study: Bedside Swallow Evaluation Previous Swallow Assessment: no Diet Prior to this Study: NPO Temperature Spikes Noted: Yes Respiratory Status: Nasal cannula History of Recent Intubation: No Behavior/Cognition: Alert;Requires cueing Oral Cavity  Assessment: Within Functional Limits Oral Cavity - Dentition: Adequate natural dentition Vision: Functional  for self-feeding Self-Feeding Abilities: Needs set up Patient Positioning: Upright in bed Baseline Vocal Quality: Low vocal intensity Volitional Swallow: Unable to elicit    Oral/Motor/Sensory Function     Ice Chips Ice chips: Not tested   Thin Liquid Thin Liquid: Within functional limits Presentation: Straw;Cup    Nectar Thick Nectar Thick Liquid: Not tested   Honey Thick Honey Thick Liquid: Not tested   Puree Puree: Within functional limits   Solid     Solid: Impaired Presentation: Self Fed Oral Phase Functional Implications: Prolonged oral transit Pharyngeal Phase Impairments: Multiple swallows      [name] E Anadia Helmes MA, CCC-SLP Acute Rehab Speech Language Pathology 05/31/2018,1:08 PM

## 2018-05-31 NOTE — Progress Notes (Signed)
Progress Note  Patient Name: Tamara Silva Date of Encounter: 05/31/2018  Primary Cardiologist: Loralie Champagne, MD   Subjective   Awake this AM, denies chest pain. Asking for something to drink. Feels breathing is "ok." No other complaints.  Inpatient Medications    Scheduled Meds: . carvedilol  3.125 mg Oral BID WC  . furosemide  80 mg Intravenous Q8H  . mouth rinse  15 mL Mouth Rinse BID  . pantoprazole (PROTONIX) IV  40 mg Intravenous Q12H   Continuous Infusions: . sodium chloride    . piperacillin-tazobactam (ZOSYN)  IV 3.375 g (05/31/18 0529)   PRN Meds: sodium chloride, albuterol, hydrALAZINE, ondansetron **OR** ondansetron (ZOFRAN) IV   Vital Signs    Vitals:   05/31/18 0353 05/31/18 0414 05/31/18 0500 05/31/18 0600  BP:  (!) 149/60  (!) 124/46  Pulse: 79 78 78 79  Resp: 20 (!) 24 (!) 21 (!) 26  Temp: 100.2 F (37.9 C) 100 F (37.8 C) 100 F (37.8 C) 99.9 F (37.7 C)  TempSrc:      SpO2: 97% 98% 96% 99%  Weight:   (!) 149.7 kg   Height:        Intake/Output Summary (Last 24 hours) at 05/31/2018 0802 Last data filed at 05/31/2018 0768 Gross per 24 hour  Intake 140.08 ml  Output 3575 ml  Net -3434.92 ml   Filed Weights   05/29/18 1600 05/30/18 0107 05/31/18 0500  Weight: (!) 184.2 kg (!) 180.5 kg (!) 149.7 kg    Telemetry    SR- Personally Reviewed  ECG    NSR, low voltage precordial leads - Personally Reviewed  Physical Exam   GEN: No acute distress.  Resting comfortably in bed Neck: No JVD appreciated though difficult due to body habitus Cardiac: RRR, no murmurs, rubs, or gallops.  Respiratory: coarse breath sounds throughout with crackles at bases GI: Soft, nontender, non-distended  MS: trace BL LE edema; No deformity. Neuro:  Nonfocal  Psych: Normal affect   Labs    Chemistry Recent Labs  Lab 05/29/18 1052 05/30/18 0754 05/31/18 0337  NA 137 139 141  K 4.7 4.7 4.3  CL 96* 98 96*  CO2 35* 33* 35*  GLUCOSE 231* 137* 89  BUN  40* 41* 39*  CREATININE 1.78* 1.66* 1.70*  CALCIUM 8.3* 8.2* 8.2*  PROT 8.3*  --   --   ALBUMIN 2.8*  --   --   AST 21  --   --   ALT 13  --   --   ALKPHOS 102  --   --   BILITOT 0.7  --   --   GFRNONAA 31* 34* 33*  GFRAA 36* 39* 38*  ANIONGAP 6 8 10      Hematology Recent Labs  Lab 05/29/18 1052 05/29/18 1237 05/30/18 0126 05/30/18 0754 05/31/18 0337  WBC 8.3  --   --  12.3* 11.7*  RBC 3.25* 3.25*  --  3.75* 3.74*  HGB 5.7*  --  7.9* 7.4* 7.3*  HCT 24.2*  --   --  29.3* 28.7*  MCV 74.5*  --   --  78.1* 76.7*  MCH 17.5*  --   --  19.7* 19.5*  MCHC 23.6*  --   --  25.3* 25.4*  RDW 26.0*  --   --  27.1* 28.2*  PLT 495*  --   --  484* 525*    Cardiac Enzymes Recent Labs  Lab 05/29/18 1052 05/30/18 0126 05/30/18 0754 05/30/18 1300  TROPONINI <0.03 <0.03 <0.03 <0.03   No results for input(s): TROPIPOC in the last 168 hours.   BNP Recent Labs  Lab 05/29/18 1052  BNP 1,218.8*     DDimer No results for input(s): DDIMER in the last 168 hours.   Radiology    Ct Abdomen Pelvis Wo Contrast  Result Date: 05/29/2018 CLINICAL DATA:  Abdominal distension. Severe anemia. GI bleed. EXAM: CT ABDOMEN AND PELVIS WITHOUT CONTRAST TECHNIQUE: Multidetector CT imaging of the abdomen and pelvis was performed following the standard protocol without IV contrast. COMPARISON:  06/23/2015 abdominal sonogram. FINDINGS: Lower chest: Mild patchy consolidation in peripheral right middle lobe. Patchy tree-in-bud opacity in the dependent right lower lobe. Parenchymal banding at both lung bases compatible with postinfectious/postinflammatory scarring. Mild cardiomegaly. Right coronary atherosclerosis. Posterior left pleural coarse calcification. Hepatobiliary: Diffuse hepatic steatosis. No definite liver surface irregularity. No liver mass. Contracted gallbladder with no radiopaque cholelithiasis. No biliary ductal dilatation. Pancreas: Normal, with no mass or duct dilation. Spleen: Normal size. No  mass. Adrenals/Urinary Tract: Normal adrenals. No hydronephrosis. No renal stones. No contour deforming renal masses. Normal bladder. Stomach/Bowel: Mild gaseous distention of the stomach with gastric fluid level. No definite gastric wall thickening. Normal caliber small bowel with no small bowel wall thickening. Candidate appendix appears normal. Mild sigmoid diverticulosis, with no large bowel wall thickening or significant pericolonic fat stranding. Vascular/Lymphatic: Atherosclerotic nonaneurysmal abdominal aorta. No pathologically enlarged lymph nodes in the abdomen or pelvis. Reproductive: Grossly normal uterus.  No adnexal mass. Other: No pneumoperitoneum. Small to moderate volume ascites. No focal fluid collection. Moderate anasarca. Musculoskeletal: No aggressive appearing focal osseous lesions. IMPRESSION: 1. No evidence of bowel obstruction or acute bowel inflammation. Mild sigmoid diverticulosis, with no evidence of acute diverticulitis. 2. Small to moderate volume ascites. Moderate anasarca. 3. Mild patchy consolidation in the peripheral right middle lobe. Patchy tree-in-bud opacity in the dependent right lower lobe. Findings suggest a mild bronchopneumonia, with the differential including aspiration. Suggest attention on follow-up chest imaging in 3 months. 4. Diffuse hepatic steatosis. 5.  Aortic Atherosclerosis (ICD10-I70.0). Electronically Signed   By: Ilona Sorrel M.D.   On: 05/29/2018 23:52   Dg Chest Port 1 View  Result Date: 05/30/2018 CLINICAL DATA:  Assess for congestive heart failure. Concern for aspiration pneumonia. EXAM: PORTABLE CHEST 1 VIEW COMPARISON:  Chest radiograph performed 01/13/2018 FINDINGS: Diffuse right-sided airspace opacification may reflect asymmetric pulmonary edema or pneumonia. Underlying aspiration cannot be excluded, though the pattern is not particularly indicative of aspiration. No significant pleural effusion or pneumothorax is seen. The cardiomediastinal  silhouette is borderline normal in size. No acute osseous abnormalities are identified. IMPRESSION: Diffuse right-sided airspace opacification may reflect asymmetric pulmonary edema or pneumonia. Underlying aspiration cannot be excluded, given clinical concern, though the pattern is not particularly indicative of aspiration. Electronically Signed   By: Garald Balding M.D.   On: 05/30/2018 05:37    Cardiac Studies   Pending echo  Patient Profile     58 y.o. female hx of COPD on home 02, DM-2, CAD with PCI at [facility] 2006 and [place] in 2008 likely to the LAD, STEMI inf wall with DES to RCA 2015  HTN, HLD, h/o of chronic LLL PE, chromic anemia, and chronic systolic HF who is being followed for the evaluation of chronic CHF at the request of Dr. Tyrell Antonio.  Assessment & Plan    Anemia: per primary team. Blood transfusion appeared to precipitate acute event yesterday. She has chronic iron deficiency anemia, but no gross  bleeding has declared itself yet to determine reason for acute drop.  Altered mental status, acute hypercarbic respiratory failure: per primary team and PCCM. On zosyn for possible pneumonia. More alert today  Acute on chronic systolic heart failure:  -weights seem inaccurate, as admission weight was 184.2 on 12/30 and weight is 149.7 kg today. Would re-weigh -based on I/O, is 4L negative since admission -NPO now given on/off bipap, once eating would sodium and fluid restrict -continue low dose carvedilol -continue aggressive diuresis with lasix 80 IV TID, monitor electrolytes closely. -repeat echo scheduled  CAD with last stent in 2015: given anemia and need for transfusion, hold aspirin and prasugrel. Troponins negative here. Once anemia addressed and stable, would restart aspirin but not prasugrel  CKD stage 3: Cr today 1.70, similar since admission but up from baseline, monitor.  TIME SPENT WITH PATIENT: >25 minutes of direct patient care. More than 50% of that time was  spent on coordination of care and counseling regarding heart failure.  Buford Dresser, MD, PhD [facility]   For questions or updates, please contact [place] Please consult [email or web address] for contact info under    Signed, Buford Dresser, MD  05/31/2018, 8:02 AM

## 2018-05-31 NOTE — Progress Notes (Signed)
Pt continuously yelling and calling out for pudding, even though I have informed and educated her multiple times that she she can only have ice chips due to being on and off Bipap and speech needing to the a swallow eval on her.

## 2018-05-31 NOTE — Progress Notes (Addendum)
PROGRESS NOTE    Tamara Silva  UDJ:[id number] DOB: 1961-04-11 DOA: 05/29/2018 PCP: Antony Blackbird, MD    Brief Narrative: Tamara Silva is a 58 y.o. female with medical history significant for chronic iron deficiency anemia with baseline hemoglobin 8-9, type 2 diabetes mellitus, chronic combined systolic/diastolic heart failure, stage III chronic kidney disease with baseline creatinine of 1.2, who is admitted for observation to [facility] long hospital on 05/29/2018 with acute on chronic iron deficiency anemia after presenting from home to [facility] long emergency department complaining of generalized weakness.   Patient develops acute hypoxic respiratory failure, suspect related to heart failure exacerbation. Chest x ray with asymmetric infiltrates, started on Zosyn.     Assessment & Plan:   Principal Problem:   Acute on chronic anemia Active Problems:   Essential hypertension   Chronic combined systolic and diastolic heart failure ([place])   Uncontrolled type 2 diabetes mellitus without complication, with long-term current use of insulin (H[facility])   AKI (acute kidney injury) ([place])   Pressure injury of skin  1-Acute hypoxic respiratory failure; in setting of Heart failure exacerbation.  On 6 L oxygen. At home on 4 L.  Chest x ray asymmetric edema. Started on IV zosyn.  Continue with lasix to 80 mg IV TID.  Monitor renal function.  ABG: Hypercapnia. Started BIPAP/ CCM consulted.  Improved. Continue with diuresis.   2-Acute on chronic systolic and diastolic Heart failure exacerbation; CAD Increase IV lasix to 80 mg TID.  Monitor I and O.  Resume carvedilol, hold  effient.  Cardiology consult/  Negative 4 L.  Supplement potasium.   3-Anemia; iron deficiency.  Received 2 units. Hb at 7.  Will give  IV iron.  Hold effient.  No evidence of acute GI bleed.  Monitor for hemoptysis.   4-AKI; cr baseline 1.2.  On admission at 1.7.  Cardio renal.  Monitor on lasix.  Stable.   5-DM; NPO  . Hold levemir.  SSI.   6-Encephalopathy; related to hypercapnia.  Lethargic. Speech slurred persist. She is more alert today.  Will repeat ABG if no significant hypercapnia will get CT head.   Addendum; CT head with acute, sub acute stroke. Discussed with neurology, will transfer patient to [place], hold on aspirin, until make sure hb is stable and no GI bleed. Might need further anticoagulation.       Pressure Injury 05/30/18 Stage I -  Intact skin with non-blanchable redness of a localized area usually over a bony prominence. (Active)  05/30/18 0030  Location: Elbow  Location Orientation: Right  Staging: Stage I -  Intact skin with non-blanchable redness of a localized area usually over a bony prominence.  Wound Description (Comments):   Present on Admission: Yes                  Estimated body mass index is 50.92 kg/m as calculated from the following:   Height as of this encounter: 5' 7.5" (1.715 m).   Weight as of this encounter: 149.7 kg.   DVT prophylaxis: scd Code Status: full code.  Family Communication: friend at bedside.  Disposition Plan: remain step down unit,   Consultants:   none   Procedures: none  Antimicrobials: zosyn   Subjective: Alert, speech slurred/ wants to eat. Breathing better .   Objective: Vitals:   05/31/18 0353 05/31/18 0414 05/31/18 0500 05/31/18 0600  BP:  (!) 149/60  (!) 124/46  Pulse: 79 78 78 79  Resp: 20 (!) 24 (!) 21 (!) 26  Temp: 100.2 F (37.9 C) 100 F (37.8 C) 100 F (37.8 C) 99.9 F (37.7 C)  TempSrc:      SpO2: 97% 98% 96% 99%  Weight:   (!) 149.7 kg   Height:        Intake/Output Summary (Last 24 hours) at 05/31/2018 0848 Last data filed at 05/31/2018 6213 Gross per 24 hour  Intake 140.08 ml  Output 3575 ml  Net -3434.92 ml   Filed Weights   05/29/18 1600 05/30/18 0107 05/31/18 0500  Weight: (!) 184.2 kg (!) 180.5 kg (!) 149.7 kg    Examination:  General exam: NAD Respiratory system:  Bilateral crackles.  Cardiovascular system: S 1, S 2 RRR Gastrointestinal system: abdomen distended, nt.  Central nervous system: more alert, moves all 4 extremities, speech slurred Extremities: plus 1 edema Skin: No rashes, lesions or ulcers    Data Reviewed: I have personally reviewed following labs and imaging studies  CBC: Recent Labs  Lab 05/29/18 1052 05/30/18 0126 05/30/18 0754 05/31/18 0337  WBC 8.3  --  12.3* 11.7*  NEUTROABS 5.5  --   --   --   HGB 5.7* 7.9* 7.4* 7.3*  HCT 24.2*  --  29.3* 28.7*  MCV 74.5*  --  78.1* 76.7*  PLT 495*  --  484* 086*   Basic Metabolic Panel: Recent Labs  Lab 05/29/18 1052 05/30/18 0754 05/31/18 0337  NA 137 139 141  K 4.7 4.7 4.3  CL 96* 98 96*  CO2 35* 33* 35*  GLUCOSE 231* 137* 89  BUN 40* 41* 39*  CREATININE 1.78* 1.66* 1.70*  CALCIUM 8.3* 8.2* 8.2*  MG  --  2.3  --    GFR: Estimated Creatinine Clearance: 55.6 mL/min (A) (by C-G formula based on SCr of 1.7 mg/dL (H)). Liver Function Tests: Recent Labs  Lab 05/29/18 1052  AST 21  ALT 13  ALKPHOS 102  BILITOT 0.7  PROT 8.3*  ALBUMIN 2.8*   No results for input(s): LIPASE, AMYLASE in the last 168 hours. No results for input(s): AMMONIA in the last 168 hours. Coagulation Profile: Recent Labs  Lab 05/30/18 0126  INR 1.41   Cardiac Enzymes: Recent Labs  Lab 05/29/18 1052 05/30/18 0126 05/30/18 0754 05/30/18 1300  TROPONINI <0.03 <0.03 <0.03 <0.03   BNP (last 3 results) No results for input(s): PROBNP in the last 8760 hours. HbA1C: No results for input(s): HGBA1C in the last 72 hours. CBG: Recent Labs  Lab 05/30/18 1231 05/30/18 1954 05/30/18 2311 05/31/18 0324 05/31/18 0805  GLUCAP 106* 92 91 92 91   Lipid Profile: No results for input(s): CHOL, HDL, LDLCALC, TRIG, CHOLHDL, LDLDIRECT in the last 72 hours. Thyroid Function Tests: Recent Labs    05/30/18 0754  TSH 1.816   Anemia Panel: Recent Labs    05/29/18 1237  VITAMINB12 981*    FOLATE 21.5  FERRITIN 9*  TIBC 471*  IRON 15*  RETICCTPCT 1.2   Sepsis Labs: No results for input(s): PROCALCITON, LATICACIDVEN in the last 168 hours.  Recent Results (from the past 240 hour(s))  MRSA PCR Screening     Status: None   Collection Time: 05/30/18 12:09 AM  Result Value Ref Range Status   MRSA by PCR NEGATIVE NEGATIVE Final    Comment:        The GeneXpert MRSA Assay (FDA approved for NASAL specimens only), is one component of a comprehensive MRSA colonization surveillance program. It is not intended to diagnose MRSA infection nor to guide or  monitor treatment for MRSA infections. Performed at [facility], [place] [address] [place], [place] [address]          Radiology Studies: Ct Abdomen Pelvis Wo Contrast  Result Date: 05/29/2018 CLINICAL DATA:  Abdominal distension. Severe anemia. GI bleed. EXAM: CT ABDOMEN AND PELVIS WITHOUT CONTRAST TECHNIQUE: Multidetector CT imaging of the abdomen and pelvis was performed following the standard protocol without IV contrast. COMPARISON:  06/23/2015 abdominal sonogram. FINDINGS: Lower chest: Mild patchy consolidation in peripheral right middle lobe. Patchy tree-in-bud opacity in the dependent right lower lobe. Parenchymal banding at both lung bases compatible with postinfectious/postinflammatory scarring. Mild cardiomegaly. Right coronary atherosclerosis. Posterior left pleural coarse calcification. Hepatobiliary: Diffuse hepatic steatosis. No definite liver surface irregularity. No liver mass. Contracted gallbladder with no radiopaque cholelithiasis. No biliary ductal dilatation. Pancreas: Normal, with no mass or duct dilation. Spleen: Normal size. No mass. Adrenals/Urinary Tract: Normal adrenals. No hydronephrosis. No renal stones. No contour deforming renal masses. Normal bladder. Stomach/Bowel: Mild gaseous distention of the stomach with gastric fluid level. No definite gastric wall thickening. Normal  caliber small bowel with no small bowel wall thickening. Candidate appendix appears normal. Mild sigmoid diverticulosis, with no large bowel wall thickening or significant pericolonic fat stranding. Vascular/Lymphatic: Atherosclerotic nonaneurysmal abdominal aorta. No pathologically enlarged lymph nodes in the abdomen or pelvis. Reproductive: Grossly normal uterus.  No adnexal mass. Other: No pneumoperitoneum. Small to moderate volume ascites. No focal fluid collection. Moderate anasarca. Musculoskeletal: No aggressive appearing focal osseous lesions. IMPRESSION: 1. No evidence of bowel obstruction or acute bowel inflammation. Mild sigmoid diverticulosis, with no evidence of acute diverticulitis. 2. Small to moderate volume ascites. Moderate anasarca. 3. Mild patchy consolidation in the peripheral right middle lobe. Patchy tree-in-bud opacity in the dependent right lower lobe. Findings suggest a mild bronchopneumonia, with the differential including aspiration. Suggest attention on follow-up chest imaging in 3 months. 4. Diffuse hepatic steatosis. 5.  Aortic Atherosclerosis (ICD10-I70.0). Electronically Signed   By: Ilona Sorrel M.D.   On: 05/29/2018 23:52   Dg Chest Port 1 View  Result Date: 05/30/2018 CLINICAL DATA:  Assess for congestive heart failure. Concern for aspiration pneumonia. EXAM: PORTABLE CHEST 1 VIEW COMPARISON:  Chest radiograph performed 01/13/2018 FINDINGS: Diffuse right-sided airspace opacification may reflect asymmetric pulmonary edema or pneumonia. Underlying aspiration cannot be excluded, though the pattern is not particularly indicative of aspiration. No significant pleural effusion or pneumothorax is seen. The cardiomediastinal silhouette is borderline normal in size. No acute osseous abnormalities are identified. IMPRESSION: Diffuse right-sided airspace opacification may reflect asymmetric pulmonary edema or pneumonia. Underlying aspiration cannot be excluded, given clinical concern,  though the pattern is not particularly indicative of aspiration. Electronically Signed   By: Garald Balding M.D.   On: 05/30/2018 05:37        Scheduled Meds: . carvedilol  3.125 mg Oral BID WC  . furosemide  80 mg Intravenous Q8H  . mouth rinse  15 mL Mouth Rinse BID  . pantoprazole (PROTONIX) IV  40 mg Intravenous Q12H   Continuous Infusions: . sodium chloride    . ferumoxytol    . piperacillin-tazobactam (ZOSYN)  IV 3.375 g (05/31/18 0529)     LOS: 1 day    Time spent: 35 minutes    Elmarie Shiley, MD [facility] Pager [phone]  If 7PM-7AM, please contact night-coverage [email or web address] Password [facility] 05/31/2018, 8:48 AM

## 2018-06-01 ENCOUNTER — Inpatient Hospital Stay (HOSPITAL_COMMUNITY): Payer: Medicaid Other

## 2018-06-01 ENCOUNTER — Inpatient Hospital Stay (HOSPITAL_BASED_OUTPATIENT_CLINIC_OR_DEPARTMENT_OTHER): Payer: Medicaid Other

## 2018-06-01 DIAGNOSIS — I639 Cerebral infarction, unspecified: Secondary | ICD-10-CM

## 2018-06-01 DIAGNOSIS — I10 Essential (primary) hypertension: Secondary | ICD-10-CM

## 2018-06-01 DIAGNOSIS — I6340 Cerebral infarction due to embolism of unspecified cerebral artery: Secondary | ICD-10-CM

## 2018-06-01 LAB — LIPID PANEL
Cholesterol: 83 mg/dL (ref 0–200)
HDL: 25 mg/dL — ABNORMAL LOW (ref >40)
LDL Cholesterol: 43 mg/dL (ref 0–99)
Total CHOL/HDL Ratio: 3.3 RATIO
Triglycerides: 77 mg/dL (ref <150)
VLDL: 15 mg/dL (ref 0–40)

## 2018-06-01 LAB — BASIC METABOLIC PANEL
Anion gap: 9 (ref 5–15)
BUN: 30 mg/dL — ABNORMAL HIGH (ref 6–20)
CHLORIDE: 92 mmol/L — AB (ref 98–111)
CO2: 38 mmol/L — ABNORMAL HIGH (ref 22–32)
Calcium: 8.3 mg/dL — ABNORMAL LOW (ref 8.9–10.3)
Creatinine, Ser: 1.39 mg/dL — ABNORMAL HIGH (ref 0.44–1.00)
GFR calc Af Amer: 48 mL/min — ABNORMAL LOW (ref >60)
GFR calc non Af Amer: 42 mL/min — ABNORMAL LOW (ref >60)
GLUCOSE: 131 mg/dL — AB (ref 70–99)
Potassium: 3.9 mmol/L (ref 3.5–5.1)
Sodium: 139 mmol/L (ref 135–145)

## 2018-06-01 LAB — GLUCOSE, CAPILLARY
GLUCOSE-CAPILLARY: 121 mg/dL — AB (ref 70–99)
Glucose-Capillary: 101 mg/dL — ABNORMAL HIGH (ref 70–99)
Glucose-Capillary: 91 mg/dL (ref 70–99)
Glucose-Capillary: 119 mg/dL — ABNORMAL HIGH (ref 70–99)

## 2018-06-01 LAB — AMMONIA: Ammonia: 94 umol/L — ABNORMAL HIGH (ref 9–35)

## 2018-06-01 LAB — CBC
HCT: 28.3 % — ABNORMAL LOW (ref 36.0–46.0)
Hemoglobin: 7.4 g/dL — ABNORMAL LOW (ref 12.0–15.0)
MCH: 20.1 pg — ABNORMAL LOW (ref 26.0–34.0)
MCHC: 26.1 g/dL — ABNORMAL LOW (ref 30.0–36.0)
MCV: 76.9 fL — ABNORMAL LOW (ref 80.0–100.0)
Platelets: 507 10*3/uL — ABNORMAL HIGH (ref 150–400)
RBC: 3.68 MIL/uL — ABNORMAL LOW (ref 3.87–5.11)
RDW: 28.4 % — ABNORMAL HIGH (ref 11.5–15.5)
WBC: 7.5 10*3/uL (ref 4.0–10.5)
nRBC: 1.2 % — ABNORMAL HIGH (ref 0.0–0.2)

## 2018-06-01 LAB — ABO/RH: ABO/RH(D): B POS

## 2018-06-01 LAB — RAPID URINE DRUG SCREEN, HOSP PERFORMED
Amphetamines: NOT DETECTED
Barbiturates: NOT DETECTED
Benzodiazepines: NOT DETECTED
Cocaine: POSITIVE — AB
Opiates: NOT DETECTED
Tetrahydrocannabinol: NOT DETECTED

## 2018-06-01 LAB — HEMOGLOBIN A1C
Hgb A1c MFr Bld: 7.1 % — ABNORMAL HIGH (ref 4.8–5.6)
Mean Plasma Glucose: 157.07 mg/dL

## 2018-06-01 LAB — PREPARE RBC (CROSSMATCH)

## 2018-06-01 MED ORDER — POTASSIUM CHLORIDE CRYS ER 20 MEQ PO TBCR
40.0000 meq | EXTENDED_RELEASE_TABLET | Freq: Every day | ORAL | Status: DC
  Administered 2018-06-01 – 2018-06-04 (×4): 40 meq via ORAL
  Filled 2018-06-01 (×4): qty 2

## 2018-06-01 MED ORDER — ZOLPIDEM TARTRATE 5 MG PO TABS
5.0000 mg | ORAL_TABLET | Freq: Once | ORAL | Status: AC
  Administered 2018-06-02: 5 mg via ORAL
  Filled 2018-06-01 (×2): qty 1

## 2018-06-01 MED ORDER — FUROSEMIDE 10 MG/ML IJ SOLN
80.0000 mg | Freq: Two times a day (BID) | INTRAMUSCULAR | Status: DC
  Administered 2018-06-02 – 2018-06-06 (×8): 80 mg via INTRAVENOUS
  Filled 2018-06-01 (×8): qty 8

## 2018-06-01 MED ORDER — ACETAMINOPHEN 325 MG PO TABS
650.0000 mg | ORAL_TABLET | Freq: Four times a day (QID) | ORAL | Status: DC | PRN
  Administered 2018-06-01 – 2018-06-03 (×2): 650 mg via ORAL
  Filled 2018-06-01: qty 2

## 2018-06-01 MED ORDER — PANTOPRAZOLE SODIUM 40 MG PO TBEC
40.0000 mg | DELAYED_RELEASE_TABLET | Freq: Every day | ORAL | Status: DC
  Administered 2018-06-01 – 2018-06-09 (×9): 40 mg via ORAL
  Filled 2018-06-01 (×9): qty 1

## 2018-06-01 MED ORDER — SODIUM CHLORIDE 0.9% IV SOLUTION: Freq: Once | INTRAVENOUS | Status: DC

## 2018-06-01 NOTE — Progress Notes (Signed)
PROGRESS NOTE    Tamara Silva  JAS:[id number] DOB: [birth year] DOA: 05/29/2018 PCP: Antony Blackbird, MD    Brief Narrative: Tamara Silva is a 58 y.o. female with medical history significant for chronic iron deficiency anemia with baseline hemoglobin 8-9, type 2 diabetes mellitus, chronic combined systolic/diastolic heart failure, stage III chronic kidney disease with baseline creatinine of 1.2, was admitted to [facility] long hospital on 05/29/2018 with acute on chronic iron deficiency anemia/severe symptomatic anemia, after blood transfusion developed volume overload and respiratory failure, started on diuretics. [facility] course was complicated by encephalopathy and some speech disturbance which prompted an MRI this was positive for stroke, transferred to [facility] for neuro/stroke team eval  Assessment & Plan:   Acute hypoxic respiratory failure -Due to volume overload following blood transfusion; also has history of chronic systolic and diastolic CHF -Improving with diuresis, continue Lasix 80 mg every 8 hours today -Discontinue Zosyn clinically no suspicion for pneumonia -Wean down O2 as tolerated at home on 4 L at baseline  Acute on chronic iron deficiency anemia -Longstanding history of same, prior negative gastroenterology work-up -No evidence of acute ongoing GI bleed, last EGD and colonoscopy in 2015 per [facility] unremarkable except for a few diverticuli -Received 2 units of PRBC on admission, hemoglobin in the low 7 range now will transfuse 1 more unit -Given IV iron yesterday -Given ongoing respiratory failure hold off on gastroenterology evaluation acutely recommend this as outpatient once more stable  Acute on chronic systolic and diastolic CHF/CAD -Continue IV Lasix 80 mg every 12 today -Continue carvedilol -Repeat echo  Embolic stroke on CT -Suspect this is an incidental finding, had trace slurred speech yesterday -Follow-up echocardiogram, carotid duplex with no significant  stenosis -MRI brain noted acute 2 cm Infarct in the inferior right parietal lobe, Right MCA/PCA watershed area  -MRA without significant intracranial stenosis -Due to severe iron deficiency anemia not on antiplatelet agents -Neurology following -PT/OT eval  History of CAD -Last stent in 2015, cards following  AKI; cr baseline 1.2.  -Admission creatinine was 1.7, now improving to 1.39 -Monitor with diuresis  DM;  -Levemir on hold, CBG stable, continue sliding scale insulin    DVT prophylaxis: scd Code Status: full code.  Family Communication: friend at bedside.  Disposition Plan: remain step down unit,   Consultants:   Cardiology  Neurology   Procedures: none  Antimicrobials: zosyn   Subjective: -Is better today, breathing is improving, -Denies any abdominal pain nausea or vomiting  Objective: Vitals:   06/01/18 0435 06/01/18 0700 06/01/18 0831 06/01/18 1128  BP: 125/69  128/68 137/63  Pulse: 79  84 79  Resp: (!) 29  20 20   Temp: 98.1 F (36.7 C)  98.5 F (36.9 C) 98 F (36.7 C)  TempSrc: Axillary  Axillary Oral  SpO2: 91% 100% 100% 100%  Weight:      Height:        Intake/Output Summary (Last 24 hours) at 06/01/2018 1425 Last data filed at 06/01/2018 1010 Gross per 24 hour  Intake 680 ml  Output 3625 ml  Net -2945 ml   Filed Weights   05/30/18 0107 05/31/18 0500 05/31/18 2050  Weight: (!) 180.5 kg (!) 149.7 kg (!) 149.7 kg    Examination:  Gen: He is chronically ill-appearing female, laying in bed, no distress awake, Alert, Oriented X 3,  HEENT: PERRLA, Neck supple, no JVD Lungs: Poor air movement, few basilar crackles CVS: RRR,No Gallops,Rubs or new Murmurs Abd: soft, Non tender, non distended, BS present  Extremities: Trace edema  skin: no new rashes Neuro: Moves all extremities, no localizing signs    Data Reviewed: I have personally reviewed following labs and imaging studies  CBC: Recent Labs  Lab 05/29/18 1052 05/30/18 0126  05/30/18 0754 05/31/18 0337 06/01/18 1024  WBC 8.3  --  12.3* 11.7* 7.5  NEUTROABS 5.5  --   --   --   --   HGB 5.7* 7.9* 7.4* 7.3* 7.4*  HCT 24.2*  --  29.3* 28.7* 28.3*  MCV 74.5*  --  78.1* 76.7* 76.9*  PLT 495*  --  484* 525* 829*   Basic Metabolic Panel: Recent Labs  Lab 05/29/18 1052 05/30/18 0754 05/31/18 0337 06/01/18 1024  NA 137 139 141 139  K 4.7 4.7 4.3 3.9  CL 96* 98 96* 92*  CO2 35* 33* 35* 38*  GLUCOSE 231* 137* 89 131*  BUN 40* 41* 39* 30*  CREATININE 1.78* 1.66* 1.70* 1.39*  CALCIUM 8.3* 8.2* 8.2* 8.3*  MG  --  2.3  --   --    GFR: Estimated Creatinine Clearance: 68 mL/min (A) (by C-G formula based on SCr of 1.39 mg/dL (H)). Liver Function Tests: Recent Labs  Lab 05/29/18 1052  AST 21  ALT 13  ALKPHOS 102  BILITOT 0.7  PROT 8.3*  ALBUMIN 2.8*   No results for input(s): LIPASE, AMYLASE in the last 168 hours. Recent Labs  Lab 06/01/18 0323  AMMONIA 94*   Coagulation Profile: Recent Labs  Lab 05/30/18 0126  INR 1.41   Cardiac Enzymes: Recent Labs  Lab 05/29/18 1052 05/30/18 0126 05/30/18 0754 05/30/18 1300  TROPONINI <0.03 <0.03 <0.03 <0.03   BNP (last 3 results) No results for input(s): PROBNP in the last 8760 hours. HbA1C: Recent Labs    06/01/18 1024  HGBA1C 7.1*   CBG: Recent Labs  Lab 05/31/18 1931 05/31/18 2329 06/01/18 0439 06/01/18 0825 06/01/18 1124  GLUCAP 136* 123* 101* 91 119*   Lipid Profile: Recent Labs    06/01/18 1024  CHOL 83  HDL 25*  LDLCALC 43  TRIG 77  CHOLHDL 3.3   Thyroid Function Tests: Recent Labs    05/30/18 0754  TSH 1.816   Anemia Panel: No results for input(s): VITAMINB12, FOLATE, FERRITIN, TIBC, IRON, RETICCTPCT in the last 72 hours. Sepsis Labs: No results for input(s): PROCALCITON, LATICACIDVEN in the last 168 hours.  Recent Results (from the past 240 hour(s))  MRSA PCR Screening     Status: None   Collection Time: 05/30/18 12:09 AM  Result Value Ref Range Status    MRSA by PCR NEGATIVE NEGATIVE Final    Comment:        The GeneXpert MRSA Assay (FDA approved for NASAL specimens only), is one component of a comprehensive MRSA colonization surveillance program. It is not intended to diagnose MRSA infection nor to guide or monitor treatment for MRSA infections. Performed at [facility], [place] [address] [place], [place] [address]          Radiology Studies: Ct Head Wo Contrast  Result Date: 05/31/2018 CLINICAL DATA:  Encephalopathy EXAM: CT HEAD WITHOUT CONTRAST TECHNIQUE: Contiguous axial images were obtained from the base of the skull through the vertex without intravenous contrast. COMPARISON:  None. FINDINGS: Brain: Small area of cytotoxic edema appearance along the right occipital parietal convexity with questionable involvement more anteriorly at the level of the parietal lobe. No definite left-sided edema. No posterior fossa abnormality is seen. There is a lacunar infarct in  the right caudate head that has a circumscribed low-density appearance suggesting chronicity on the coronal reformats. Vascular: Negative Skull: Negative Sinuses/Orbits: Negative These results were called by telephone at the time of interpretation on 05/31/2018 at 3:36 pm to Dr. Niel Hummer , who verbally acknowledged these results. IMPRESSION: 1. Moderate, acute to subacute cortex infarct at the right occipitoparietal convexity. 2. Remote lacunar infarct in the right caudate. 3. Motion degraded. Electronically Signed   By: Monte Fantasia M.D.   On: 05/31/2018 15:37   Mr Virgel Paling LK Contrast  Result Date: 06/01/2018 CLINICAL DATA:  58 year old female with heart failure, iron deficiency anemia, chronic kidney disease. Weakness, encephalopathy. EXAM: MRI HEAD WITHOUT CONTRAST MRA HEAD WITHOUT CONTRAST TECHNIQUE: Multiplanar, multiecho pulse sequences of the brain and surrounding structures were obtained without intravenous contrast. Angiographic images of the  head were obtained using MRA technique without contrast. COMPARISON:  Head CT 05/31/2018 FINDINGS: MRI HEAD FINDINGS Brain: The examination had to be discontinued prior to completion due to patient motion. Sagittal T1 weighted imaging was not obtained, and other sequences are intermittently degraded by motion. Additionally, T2* imaging, time-of-flight MRA, and diffusion imaging to a lesser extent are degraded by extensive intravascular susceptibility which is probably related to recent intravenous iron administration. There is a 2 centimeter area of restricted diffusion in the inferior right parietal lobe posteriorly on series 5, image 54. There is associated gyriform cytotoxic edema. No significant mass effect and no definite hemorrhage. Additional patchy 3-4 centimeter area of abnormal diffusion in the posterior right frontal lobe and right parietal lobe white matter on series 5, image 57 is isointense on ADC with mild T2 and FLAIR hyperintensity. No mass effect. No left hemisphere or posterior fossa restricted diffusion. No midline shift, mass effect, evidence of mass lesion, ventriculomegaly, extra-axial collection or extra-axial hemorrhage. Cervicomedullary junction and pituitary are within normal limits. Questionable small area of cortical encephalomalacia in the left superior parietal lobe on series 13, image 20. Small chronic lacunar infarct in the right caudate. Vascular: Major intracranial vascular flow voids are preserved. Skull and upper cervical spine: Not well evaluated in the absence of sagittal T1 weighted imaging. Sinuses/Orbits: Negative orbits. Trace fluid layering in the right sphenoid sinus. Other paranasal sinuses are well pneumatized. Chronic left lamina papyracea fracture suspected. Other: Trace left mastoid effusion. Trace retained secretions in the nasopharynx. The right mastoids are clear. Scalp and face soft tissues appear negative. MRA HEAD FINDINGS Severely limited by a combination of  motion and vascular susceptibility artifact probably related to recent intravenous iron administration. The distal vertebral arteries and basilar artery appear to be patent. Both ICA siphons and carotid termini appear to be patent. The 1st order circle-of-Willis branches (A1, M1, and P1) appear to be patent bilaterally. IMPRESSION: 1. Study limited by suspected recent intravenous iron administration, and also motion artifact. 2. Positive for Acute 2 cm Infarct in the inferior right parietal lobe, Right MCA/PCA watershed area corresponding to the recent CT finding. No associated mass effect. 3. Superimposed patchy Subacute Infarct in the posterior Right MCA territory (series 5, image 57). No mass effect. 4. Limited MRA suggests no intracranial large vessel occlusion. 5. Chronic lacunar infarct right caudate nucleus. Electronically Signed   By: Genevie Ann M.D.   On: 06/01/2018 07:02   Mr Brain Wo Contrast  Result Date: 06/01/2018 CLINICAL DATA:  58 year old female with heart failure, iron deficiency anemia, chronic kidney disease. Weakness, encephalopathy. EXAM: MRI HEAD WITHOUT CONTRAST MRA HEAD WITHOUT CONTRAST TECHNIQUE: Multiplanar, multiecho pulse  sequences of the brain and surrounding structures were obtained without intravenous contrast. Angiographic images of the head were obtained using MRA technique without contrast. COMPARISON:  Head CT 05/31/2018 FINDINGS: MRI HEAD FINDINGS Brain: The examination had to be discontinued prior to completion due to patient motion. Sagittal T1 weighted imaging was not obtained, and other sequences are intermittently degraded by motion. Additionally, T2* imaging, time-of-flight MRA, and diffusion imaging to a lesser extent are degraded by extensive intravascular susceptibility which is probably related to recent intravenous iron administration. There is a 2 centimeter area of restricted diffusion in the inferior right parietal lobe posteriorly on series 5, image 54. There is  associated gyriform cytotoxic edema. No significant mass effect and no definite hemorrhage. Additional patchy 3-4 centimeter area of abnormal diffusion in the posterior right frontal lobe and right parietal lobe white matter on series 5, image 57 is isointense on ADC with mild T2 and FLAIR hyperintensity. No mass effect. No left hemisphere or posterior fossa restricted diffusion. No midline shift, mass effect, evidence of mass lesion, ventriculomegaly, extra-axial collection or extra-axial hemorrhage. Cervicomedullary junction and pituitary are within normal limits. Questionable small area of cortical encephalomalacia in the left superior parietal lobe on series 13, image 20. Small chronic lacunar infarct in the right caudate. Vascular: Major intracranial vascular flow voids are preserved. Skull and upper cervical spine: Not well evaluated in the absence of sagittal T1 weighted imaging. Sinuses/Orbits: Negative orbits. Trace fluid layering in the right sphenoid sinus. Other paranasal sinuses are well pneumatized. Chronic left lamina papyracea fracture suspected. Other: Trace left mastoid effusion. Trace retained secretions in the nasopharynx. The right mastoids are clear. Scalp and face soft tissues appear negative. MRA HEAD FINDINGS Severely limited by a combination of motion and vascular susceptibility artifact probably related to recent intravenous iron administration. The distal vertebral arteries and basilar artery appear to be patent. Both ICA siphons and carotid termini appear to be patent. The 1st order circle-of-Willis branches (A1, M1, and P1) appear to be patent bilaterally. IMPRESSION: 1. Study limited by suspected recent intravenous iron administration, and also motion artifact. 2. Positive for Acute 2 cm Infarct in the inferior right parietal lobe, Right MCA/PCA watershed area corresponding to the recent CT finding. No associated mass effect. 3. Superimposed patchy Subacute Infarct in the posterior  Right MCA territory (series 5, image 57). No mass effect. 4. Limited MRA suggests no intracranial large vessel occlusion. 5. Chronic lacunar infarct right caudate nucleus. Electronically Signed   By: Genevie Ann M.D.   On: 06/01/2018 07:02   Vas US Carotid (at [facility] Only)  Result Date: 06/01/2018 Carotid Arterial Duplex Study Indications:       CVA. Risk Factors:      Hypertension. Other Factors:     Heart failure. Limitations:       patient has labored breathing; short neck; line on site;                    stiffness. Comparison Study:  No comparison study available Performing Technologist: Rudell Cobb  Examination Guidelines: A complete evaluation includes B-mode imaging, spectral Doppler, color Doppler, and power Doppler as needed of all accessible portions of each vessel. Bilateral testing is considered an integral part of a complete examination. Limited examinations for reoccurring indications may be performed as noted.  Right Carotid Findings: +----------+-------+--------+--------+--------------------------------+--------+           PSV    EDV cm/sStenosisDescribe  Comments           cm/s                                                            +----------+-------+--------+--------+--------------------------------+--------+ CCA Prox  152    31                                              tortuous +----------+-------+--------+--------+--------------------------------+--------+ CCA Distal69     24                                                       +----------+-------+--------+--------+--------------------------------+--------+ ICA Prox  70     25      1-39%   diffuse, hyperechoic and                                                  irregular                                +----------+-------+--------+--------+--------------------------------+--------+ ICA Mid   89     38                                                        +----------+-------+--------+--------+--------------------------------+--------+ ICA Distal134    42                                                       +----------+-------+--------+--------+--------------------------------+--------+ ECA       68     15                                                       +----------+-------+--------+--------+--------------------------------+--------+ +----------+--------+-------+--------+-------------------+           PSV cm/sEDV cmsDescribeArm Pressure (mmHG) +----------+--------+-------+--------+-------------------+ [id number]                                         +----------+--------+-------+--------+-------------------+ +---------+--------+---+--------+--+---------+ VertebralPSV cm/s101EDV cm/s38Antegrade +---------+--------+---+--------+--+---------+  Left Carotid Findings: +----------+--------+--------+--------+------------------+--------+           PSV cm/sEDV cm/sStenosisDescribe          Comments +----------+--------+--------+--------+------------------+--------+ CCA Prox  101     32                                         +----------+--------+--------+--------+------------------+--------+  CCA Distal93      20                                         +----------+--------+--------+--------+------------------+--------+ ICA Prox  84      24      1-39%   focal and calcific         +----------+--------+--------+--------+------------------+--------+ ICA Mid   102     37                                         +----------+--------+--------+--------+------------------+--------+ ICA Distal125     52                                         +----------+--------+--------+--------+------------------+--------+ ECA       93                                                 +----------+--------+--------+--------+------------------+--------+ +----------+--------+--------+--------+-------------------+  SubclavianPSV cm/sEDV cm/sDescribeArm Pressure (mmHG) +----------+--------+--------+--------+-------------------+           102                                         +----------+--------+--------+--------+-------------------+ +---------+--------+---+--------+--+---------+ VertebralPSV cm/s100EDV cm/s37Antegrade +---------+--------+---+--------+--+---------+ IJV extendedly enlarged.  Summary: Right Carotid: Velocities in the right ICA are consistent with a 1-39% stenosis. Left Carotid: Velocities in the left ICA are consistent with a 1-39% stenosis.               Left IJV extendedly enlarged. Vertebrals: Bilateral vertebral arteries demonstrate antegrade flow. *See table(s) above for measurements and observations.     Preliminary    Vas [place] Lower Extremity Venous (dvt)  Result Date: 06/01/2018  Lower Venous Study Indications: Embolic stroke.  Limitations: Body habitus, line and Immobility. Comparison Study: No prior study available Performing Technologist: Rudell Cobb  Examination Guidelines: A complete evaluation includes B-mode imaging, spectral Doppler, color Doppler, and power Doppler as needed of all accessible portions of each vessel. Bilateral testing is considered an integral part of a complete examination. Limited examinations for reoccurring indications may be performed as noted.  Right Venous Findings: +---------+---------------+---------+-----------+----------+-------------------+          CompressibilityPhasicitySpontaneityPropertiesSummary             +---------+---------------+---------+-----------+----------+-------------------+ CFV      Full           Yes      Yes                                      +---------+---------------+---------+-----------+----------+-------------------+ SFJ      Full                                                             +---------+---------------+---------+-----------+----------+-------------------+  FV Prox  Full                                                              +---------+---------------+---------+-----------+----------+-------------------+ FV Mid   Full                                                             +---------+---------------+---------+-----------+----------+-------------------+ FV Distal                        Yes                  Unable to visualiz                                                        with compression    +---------+---------------+---------+-----------+----------+-------------------+ PFV      Full                                                             +---------+---------------+---------+-----------+----------+-------------------+ POP      Full           Yes      Yes                                      +---------+---------------+---------+-----------+----------+-------------------+ PTV      Full                                                             +---------+---------------+---------+-----------+----------+-------------------+ PERO     Full                                                             +---------+---------------+---------+-----------+----------+-------------------+  Left Venous Findings: +---------+---------------+---------+-----------+----------+-------------------+          CompressibilityPhasicitySpontaneityPropertiesSummary             +---------+---------------+---------+-----------+----------+-------------------+ CFV      Full           Yes      Yes                                      +---------+---------------+---------+-----------+----------+-------------------+ SFJ  Full                                                             +---------+---------------+---------+-----------+----------+-------------------+ FV Prox  Full                                                             +---------+---------------+---------+-----------+----------+-------------------+ FV Mid    Full                                                             +---------+---------------+---------+-----------+----------+-------------------+ FV DistalFull                                         Unable to visualiz                                                        with compression    +---------+---------------+---------+-----------+----------+-------------------+ PFV      Full                                                             +---------+---------------+---------+-----------+----------+-------------------+ POP      Full           Yes      Yes                                      +---------+---------------+---------+-----------+----------+-------------------+ PTV      Full                                                             +---------+---------------+---------+-----------+----------+-------------------+ PERO     Full                                                             +---------+---------------+---------+-----------+----------+-------------------+ Prominent lymph node see at groin area.    Summary: Right: There is no evidence of deep vein thrombosis in the lower extremity. However, portions of this examination were limited- see technologist comments above.  No cystic structure found in the popliteal fossa. Left: There is no evidence of deep vein thrombosis in the lower extremity. However, portions of this examination were limited- see technologist comments above. No cystic structure found in the popliteal fossa.  *See table(s) above for measurements and observations.    Preliminary         Scheduled Meds: . sodium chloride   Intravenous Once  . carvedilol  3.125 mg Oral BID WC  . furosemide  80 mg Intravenous Q8H  . mouth rinse  15 mL Mouth Rinse BID  . pantoprazole  40 mg Oral Q1200   Continuous Infusions: . sodium chloride       LOS: 2 days    Time spent: 35 minutes    [name], MD [facility] Page via [email or web address]  If 7PM-7AM, please contact night-coverage [email or web address] Password [email or web address] 06/01/2018, 2:25 PM

## 2018-06-01 NOTE — Evaluation (Signed)
Physical Therapy Evaluation Patient Details Name: Tamara Silva MRN: [id number] DOB: [birth year] Today's Date: 06/01/2018   History of Present Illness  Pt is a 58 y/o female with PMHx including Asthma, Chronic combined systolic (congestive) and diastolic (congestive) heart failure, Chronic iron deficiency anemia (baseline hemoglobin 8-9), Diabetes mellitus without complication, and Hypertension. Pt was admitted to [facility] long hospital on 05/29/2018 with acute on chronic iron deficiency anemia/severe symptomatic anemia, after blood transfusion developed volume overload and respiratory failure, started on diuretics.Hospital course was complicated by encephalopathy and some speech disturbance which prompted an MRI. MRI noted acute 2 cm Infarct in the inferior right parietal lobe, Right MCA/PCA watershed area, Chronic lacunar infarct right caudate nucleus. Pt was transferred to [facility] for neuro/stroke team eval.   Clinical Impression  Pt admitted with/for with s/s of stroke, complicated by encephalopathy.  Pt is presently needing significant cues and assistance for general mobility.  Pt currently limited functionally due to the problems listed. ( See problems list.)   Pt will benefit from PT to maximize function and safety in order to get ready for next venue listed below.     Follow Up Recommendations SNF    Equipment Recommendations  Other (comment)(TBA)    Recommendations for Other Services       Precautions / Restrictions Precautions Precautions: Fall Restrictions Weight Bearing Restrictions: No      Mobility  Bed Mobility Overal bed mobility: Needs Assistance Bed Mobility: Supine to Sit;Sit to Supine     Supine to sit: Max assist;+2 for physical assistance;+2 for safety/equipment Sit to supine: Max assist;+2 for physical assistance;+2 for safety/equipment   General bed mobility comments: assist for LEs over EOB and to elevate trunk, use of bed pad to scoot hips towards EOB; pt with  strong posterior lean requiring support for sitting balance  Transfers Overall transfer level: Needs assistance Equipment used: 2 person hand held assist Transfers: Sit to/from Stand Sit to Stand: Max assist;+2 physical assistance;+2 safety/equipment         General transfer comment: performed brief stand from EOB with two person maxA, pt able to clear buttocks from EOB but unable to come to a full standing position   Ambulation/Gait             General Gait Details: not able today  Stairs            Wheelchair Mobility    Modified Rankin (Stroke Patients Only) Modified Rankin (Stroke Patients Only) Pre-Morbid Rankin Score: Moderate disability Modified Rankin: Severe disability     Balance Overall balance assessment: Needs assistance Sitting-balance support: Feet supported;Bilateral upper extremity supported;Single extremity supported Sitting balance-Leahy Scale: Poor Sitting balance - Comments: requires at least minA for sitting balance up to maxA when fatigued Postural control: Posterior lean Standing balance support: Bilateral upper extremity supported Standing balance-Leahy Scale: Zero Standing balance comment: maxA+2 for sit<>stand                             Pertinent Vitals/Pain Pain Assessment: No/denies pain    Home Living Family/patient expects to be discharged to:: Private residence Living Arrangements: Spouse/significant other(boyfriend per chart, per pt pt lives with roommate) Available Help at Discharge: Family;Friend(s) Type of Home: House Home Access: Stairs to enter   [employer] of Steps: 2 Home Layout: One level Home Equipment: Cane - single point;Bedside commode Additional Comments: PLOF obtained via chart review from admission in 12/2017    Prior Function Level of  Independence: Needs assistance   Gait / Transfers Assistance Needed: pt not able to state, pt was ambulating approx 10-20' using RW with PT and  minguard assist (+2 safety) during recent admission in 12/2017  ADL's / Homemaking Assistance Needed: was receiving assist for bathing/dressing ADLs; per chart review pt's significant other assisted with bating at bed level        Hand Dominance        Extremity/Trunk Assessment   Upper Extremity Assessment Upper Extremity Assessment: Defer to OT evaluation RUE Deficits / Details: decreased fine motor/coordination noted during finger opposition; grossly 4/5 strength throughout    Lower Extremity Assessment Lower Extremity Assessment: RLE deficits/detail;LLE deficits/detail RLE Deficits / Details: general weakness gross flexion weaker than gross ext. RLE Coordination: decreased fine motor LLE Deficits / Details: general weakness LLE Coordination: decreased fine motor       Communication   Communication: Expressive difficulties(mumbled speech at times)  Cognition Arousal/Alertness: Awake/alert Behavior During Therapy: Flat affect;WFL for tasks assessed/performed Overall Cognitive Status: No family/caregiver present to determine baseline cognitive functioning Area of Impairment: Memory;Following commands;Safety/judgement;Attention;Awareness;Problem solving                   Current Attention Level: Sustained Memory: Decreased short-term memory Following Commands: Follows one step commands with increased time;Follows one step commands inconsistently Safety/Judgement: Decreased awareness of safety;Decreased awareness of deficits Awareness: Emergent Problem Solving: Slow processing;Decreased initiation;Difficulty sequencing;Requires verbal cues;Requires tactile cues General Comments: slower processing noted and pt with inconsistent responses when inquiring about PLOF; noted to perseverate on specific responses when attempting to ask questions; pt with unintelligible speech at times       General Comments General comments (skin integrity, edema, etc.): vss    Exercises      Assessment/Plan    PT Assessment Patient needs continued PT services  PT Problem List Decreased strength;Decreased activity tolerance;Decreased balance;Decreased mobility;Decreased coordination;Decreased cognition;Decreased safety awareness       PT Treatment Interventions Gait training;DME instruction;Functional mobility training;Therapeutic activities;Balance training;Patient/family education    PT Goals (Current goals can be found in the Care Plan section)  Acute Rehab PT Goals Patient Stated Goal: none stated this session PT Goal Formulation: With patient Time For Goal Achievement: 06/15/18 Potential to Achieve Goals: Fair    Frequency Min 3X/week   Barriers to discharge        Co-evaluation PT/OT/SLP Co-Evaluation/Treatment: Yes Reason for Co-Treatment: Complexity of the patient's impairments (multi-system involvement) PT goals addressed during session: Mobility/safety with mobility OT goals addressed during session: ADL's and self-care;Strengthening/ROM       AM-PAC PT "6 Clicks" Mobility  Outcome Measure Help needed turning from your back to your side while in a flat bed without using bedrails?: Total Help needed moving from lying on your back to sitting on the side of a flat bed without using bedrails?: Total Help needed moving to and from a bed to a chair (including a wheelchair)?: Total Help needed standing up from a chair using your arms (e.g., wheelchair or bedside chair)?: Total Help needed to walk in hospital room?: Total Help needed climbing 3-5 steps with a railing? : Total 6 Click Score: 6    End of Session   Activity Tolerance: Patient tolerated treatment well;Patient limited by fatigue Patient left: in bed;with call bell/phone within reach Nurse Communication: Mobility status PT Visit Diagnosis: Other abnormalities of gait and mobility (R26.89);Muscle weakness (generalized) (M62.81);Other symptoms and signs involving the nervous system  (R29.898);Difficulty in walking, not elsewhere classified (R26.2)  Time: 9499-7182 PT Time Calculation (min) (ACUTE ONLY): 33 min   Charges:   PT Evaluation $PT Eval Moderate Complexity: 1 Mod          06/01/2018  Donnella Sham, PT Acute Rehabilitation Services [phone]  (pager) [phone]  (office)  Tessie Fass Jcion Buddenhagen 06/01/2018, 4:42 PM

## 2018-06-01 NOTE — Progress Notes (Signed)
Bilateral lower extremities venous duplex exam completed. Please see preliminary notes on CV PROC under chart review. Laraina Sulton H Anaise Sterbenz(RDMS RVT) 06/01/18 12:29 PM

## 2018-06-01 NOTE — Progress Notes (Signed)
Second split of blood stsrted @2140  with pre/blood admins.  v/s stable and recorded in epic.

## 2018-06-01 NOTE — Evaluation (Signed)
Occupational Therapy Evaluation Patient Details Name: Tamara Silva MRN: [id number] DOB: 10/07/1960 Today's Date: 06/01/2018    History of Present Illness Pt is a 58 y/o female with PMHx including Asthma, Chronic combined systolic (congestive) and diastolic (congestive) heart failure, Chronic iron deficiency anemia (baseline hemoglobin 8-9), Diabetes mellitus without complication, and Hypertension. Pt was admitted to [facility] long hospital on 05/29/2018 with acute on chronic iron deficiency anemia/severe symptomatic anemia, after blood transfusion developed volume overload and respiratory failure, started on diuretics.Hospital course was complicated by encephalopathy and some speech disturbance which prompted an MRI. MRI noted acute 2 cm Infarct in the inferior right parietal lobe, Right MCA/PCA watershed area, Chronic lacunar infarct right caudate nucleus. Pt was transferred to [facility] for neuro/stroke team eval.    Clinical Impression   This 58 y/o female presents with the above. PLOF obtained via chart review as pt with difficulty providing information during this session. During recent admission (12/2017) pt was performing functional mobility short distances using RW with minguard assist; was receiving assist for bathing/dressing ADLs from her significant other (boyfriend). Pt presenting with decreased activity tolerance, impaired cognition, decreased sitting and standing balance. Pt requiring maxA+2 for bed mobility and for brief sit<>stand from EOB. Pt requiring at least minA up to maxA for sitting balance EOB. Pt demonstrating slower processing and requiring repetition and multimodal cues to follow one step commands. She currently requires modA for UB ADL, max-totalA for LB ADLs. Pt will benefit from continued acute OT services and recommend follow up therapy services in SNF setting after discharge to maximize her safety and independence with ADLs and mobility. Will follow.     Follow Up  Recommendations  SNF;Supervision/Assistance - 24 hour    Equipment Recommendations  Other (comment)(TBD in next venue)           Precautions / Restrictions Precautions Precautions: Fall Restrictions Weight Bearing Restrictions: No      Mobility Bed Mobility Overal bed mobility: Needs Assistance Bed Mobility: Supine to Sit;Sit to Supine     Supine to sit: Max assist;+2 for physical assistance;+2 for safety/equipment Sit to supine: Max assist;+2 for physical assistance;+2 for safety/equipment   General bed mobility comments: assist for LEs over EOB and to elevate trunk, use of bed pad to scoot hips towards EOB; pt with strong posterior lean requiring support for sitting balance  Transfers Overall transfer level: Needs assistance Equipment used: 2 person hand held assist Transfers: Sit to/from Stand Sit to Stand: Max assist;+2 physical assistance;+2 safety/equipment         General transfer comment: performed brief stand from EOB with two person maxA, pt able to clear buttocks from EOB but unable to come to a full standing position     Balance Overall balance assessment: Needs assistance Sitting-balance support: Feet supported;Bilateral upper extremity supported;Single extremity supported Sitting balance-Leahy Scale: Poor Sitting balance - Comments: requires at least minA for sitting balance up to maxA when fatigued Postural control: Posterior lean Standing balance support: Bilateral upper extremity supported Standing balance-Leahy Scale: Zero Standing balance comment: maxA+2 for sit<>stand                           ADL either performed or assessed with clinical judgement   ADL Overall ADL's : Needs assistance/impaired Eating/Feeding: Minimal assistance;Set up;Sitting;Bed level   Grooming: Bed level;Minimal assistance;Min guard   Upper Body Bathing: Minimal assistance;Bed level   Lower Body Bathing: Maximal assistance;+2 for physical assistance;+2 for  safety/equipment;Sitting/lateral leans;Sit to/from  stand   Upper Body Dressing : Moderate assistance;Sitting Upper Body Dressing Details (indicate cue type and reason): requires increased assist due to poor sitting balance Lower Body Dressing: Maximal assistance;Total assistance;+2 for physical assistance;+2 for safety/equipment;Sit to/from stand       Toileting- Water quality scientist and Hygiene: Total assistance;+2 for physical assistance;+2 for safety/equipment;Bed level       Functional mobility during ADLs: Maximal assistance;+2 for physical assistance;+2 for safety/equipment General ADL Comments: pt with decreased balance, endurance, impaired cognition and suspect impaired vision     Vision Baseline Vision/History: No visual deficits Patient Visual Report: Other (comment)(reports dizziness) Vision Assessment?: Yes Eye Alignment: Within Functional Limits Tracking/Visual Pursuits: Decreased smoothness of horizontal tracking;Decreased smoothness of vertical tracking Visual Fields: Impaired-to be further tested in functional context Additional Comments: attempted to assess visual quadrants, pt with inconsistent responses and difficulty following commands during formal assessment. pt at times will identify objects moving in periphery, but at other times therapist's fingers will move from periphery towards directly in front of pt without response from pt; unsure of visual deficit vs slower processing, will continue to assess      Perception     Praxis      Pertinent Vitals/Pain Pain Assessment: No/denies pain     Hand Dominance     Extremity/Trunk Assessment Upper Extremity Assessment Upper Extremity Assessment: RUE deficits/detail RUE Deficits / Details: decreased fine motor/coordination noted during finger opposition; grossly 4/5 strength throughout   Lower Extremity Assessment Lower Extremity Assessment: Defer to PT evaluation       Communication  Communication Communication: Expressive difficulties(mumbled speech at times)   Cognition Arousal/Alertness: Awake/alert Behavior During Therapy: Flat affect;WFL for tasks assessed/performed Overall Cognitive Status: No family/caregiver present to determine baseline cognitive functioning Area of Impairment: Memory;Following commands;Safety/judgement;Attention;Awareness;Problem solving                   Current Attention Level: Sustained Memory: Decreased short-term memory Following Commands: Follows one step commands with increased time;Follows one step commands inconsistently Safety/Judgement: Decreased awareness of safety;Decreased awareness of deficits Awareness: Emergent Problem Solving: Slow processing;Decreased initiation;Difficulty sequencing;Requires verbal cues;Requires tactile cues General Comments: slower processing noted and pt with inconsistent responses when inquiring about PLOF; noted to perseverate on specific responses when attempting to ask questions; pt with unintelligible speech at times    General Comments  VSS    Exercises     Shoulder Instructions      Home Living Family/patient expects to be discharged to:: Private residence Living Arrangements: Spouse/significant other(boyfriend per chart, per pt pt lives with roommate) Available Help at Discharge: Family;Friend(s) Type of Home: House Home Access: Stairs to enter [employer] of Steps: 2   Home Layout: One level     Bathroom Shower/Tub: Teacher, early years/pre: Handicapped height     Home Equipment: [employer] - single point;Bedside commode   Additional Comments: PLOF obtained via chart review from admission in 12/2017      Prior Functioning/Environment Level of Independence: Needs assistance  Gait / Transfers Assistance Needed: pt not able to state, pt was ambulating approx 10-20' using RW with PT and minguard assist (+2 safety) during recent admission in 12/2017 ADL's /  Homemaking Assistance Needed: was receiving assist for bathing/dressing ADLs; per chart review pt's significant other assisted with bating at bed level            OT Problem List: Decreased strength;Decreased range of motion;Decreased activity tolerance;Impaired balance (sitting and/or standing);Impaired vision/perception;Decreased coordination;Decreased cognition;Decreased safety awareness;Obesity  OT Treatment/Interventions: Self-care/ADL training;Therapeutic exercise;DME and/or AE instruction;Therapeutic activities;Visual/perceptual remediation/compensation;Patient/family education;Balance training;Cognitive remediation/compensation    OT Goals(Current goals can be found in the care plan section) Acute Rehab OT Goals Patient Stated Goal: none stated this session OT Goal Formulation: With patient Time For Goal Achievement: 06/15/18 Potential to Achieve Goals: Good  OT Frequency: Min 2X/week   Barriers to D/C:            Co-evaluation PT/OT/SLP Co-Evaluation/Treatment: Yes Reason for Co-Treatment: Complexity of the patient's impairments (multi-system involvement);For patient/therapist safety;To address functional/ADL transfers   OT goals addressed during session: ADL's and self-care;Strengthening/ROM      AM-PAC OT "6 Clicks" Daily Activity     Outcome Measure Help from another person eating meals?: A Little Help from another person taking care of personal grooming?: A Little Help from another person toileting, which includes using toliet, bedpan, or urinal?: Total Help from another person bathing (including washing, rinsing, drying)?: A Lot Help from another person to put on and taking off regular upper body clothing?: A Lot Help from another person to put on and taking off regular lower body clothing?: Total 6 Click Score: 12   End of Session Equipment Utilized During Treatment: Oxygen Nurse Communication: Mobility status  Activity Tolerance: Patient tolerated  treatment well Patient left: in bed;with call bell/phone within reach  OT Visit Diagnosis: Muscle weakness (generalized) (M62.81);Unsteadiness on feet (R26.81);Other symptoms and signs involving the nervous system (R29.898);Other symptoms and signs involving cognitive function                Time: 8466-5993 OT Time Calculation (min): 33 min Charges:  OT General Charges $OT Visit: 1 Visit OT Evaluation $OT Eval Moderate Complexity: 1 Mod  Lou Cal, OT [employer] Pager [phone] Office [phone]   [name] 06/01/2018, 4:28 PM

## 2018-06-01 NOTE — Progress Notes (Signed)
OT Cancellation Note  Patient Details Name: Tamara Silva MRN: [id number] DOB: 08-Mar-1961   Cancelled Treatment:    Reason Eval/Treat Not Completed: Fatigue/lethargy limiting ability to participate; pt arouses to name, though minimally responding to therapist (only via headnods) and quickly falling back asleep. Pt's significant other present and reports pt was more awake/alert earlier today. Will check back as schedule permits for OT eval.   Lou Cal, OT [facility] Pager [phone] Office [phone]   [name] 06/01/2018, 1:06 PM

## 2018-06-01 NOTE — Progress Notes (Addendum)
STROKE TEAM PROGRESS NOTE   INTERVAL HISTORY Her boyfriend is at the bedside.  She is sleeping, will awaked with stimulation. Overall, dysarthric without aphasia. Stroke workup underway along with addressing medical issues. Tolerating diet. No new complaints.   Vitals:   05/31/18 2323 06/01/18 0435 06/01/18 0700 06/01/18 0831  BP: 112/71 125/69  128/68  Pulse: 77 79  84  Resp: (!) 31 (!) 29  20  Temp: 98.5 F (36.9 C) 98.1 F (36.7 C)  98.5 F (36.9 C)  TempSrc: Axillary Axillary  Axillary  SpO2: 92% 91% 100% 100%  Weight:      Height:        CBC:  Recent Labs  Lab 05/29/18 1052  05/31/18 0337 06/01/18 1024  WBC 8.3   < > 11.7* 7.5  NEUTROABS 5.5  --   --   --   HGB 5.7*   < > 7.3* 7.4*  HCT 24.2*   < > 28.7* 28.3*  MCV 74.5*   < > 76.7* 76.9*  PLT 495*   < > 525* 507*   < > = values in this interval not displayed.    Basic Metabolic Panel:  Recent Labs  Lab 05/30/18 0754 05/31/18 0337 06/01/18 1024  NA 139 141 139  K 4.7 4.3 3.9  CL 98 96* 92*  CO2 33* 35* 38*  GLUCOSE 137* 89 131*  BUN 41* 39* 30*  CREATININE 1.66* 1.70* 1.39*  CALCIUM 8.2* 8.2* 8.3*  MG 2.3  --   --    Lipid Panel:     Component Value Date/Time   CHOL 83 06/01/2018 1024   TRIG 77 06/01/2018 1024   HDL 25 (L) 06/01/2018 1024   CHOLHDL 3.3 06/01/2018 1024   VLDL 15 06/01/2018 1024   LDLCALC 43 06/01/2018 1024   HgbA1c:  Lab Results  Component Value Date   HGBA1C 7.1 (H) 06/01/2018   Urine Drug Screen:     Component Value Date/Time   LABOPIA NEGATIVE 07/28/2013 2255   COCAINSCRNUR NEGATIVE 07/28/2013 2255   LABBENZ POSITIVE (A) 07/28/2013 2255   AMPHETMU NEGATIVE 07/28/2013 2255    Alcohol Level No results found for: ETH  IMAGING Ct Head Wo Contrast  Result Date: 05/31/2018 CLINICAL DATA:  Encephalopathy EXAM: CT HEAD WITHOUT CONTRAST TECHNIQUE: Contiguous axial images were obtained from the base of the skull through the vertex without intravenous contrast. COMPARISON:   None. FINDINGS: Brain: Small area of cytotoxic edema appearance along the right occipital parietal convexity with questionable involvement more anteriorly at the level of the parietal lobe. No definite left-sided edema. No posterior fossa abnormality is seen. There is a lacunar infarct in the right caudate head that has a circumscribed low-density appearance suggesting chronicity on the coronal reformats. Vascular: Negative Skull: Negative Sinuses/Orbits: Negative These results were called by telephone at the time of interpretation on 05/31/2018 at 3:36 pm to Dr. Niel Hummer , who verbally acknowledged these results. IMPRESSION: 1. Moderate, acute to subacute cortex infarct at the right occipitoparietal convexity. 2. Remote lacunar infarct in the right caudate. 3. Motion degraded. Electronically Signed   By: Monte Fantasia M.D.   On: 05/31/2018 15:37   Mr Virgel Paling WJ Contrast  Result Date: 06/01/2018 CLINICAL DATA:  58 year old female with heart failure, iron deficiency anemia, chronic kidney disease. Weakness, encephalopathy. EXAM: MRI HEAD WITHOUT CONTRAST MRA HEAD WITHOUT CONTRAST TECHNIQUE: Multiplanar, multiecho pulse sequences of the brain and surrounding structures were obtained without intravenous contrast. Angiographic images of the head were obtained  using MRA technique without contrast. COMPARISON:  Head CT 05/31/2018 FINDINGS: MRI HEAD FINDINGS Brain: The examination had to be discontinued prior to completion due to patient motion. Sagittal T1 weighted imaging was not obtained, and other sequences are intermittently degraded by motion. Additionally, T2* imaging, time-of-flight MRA, and diffusion imaging to a lesser extent are degraded by extensive intravascular susceptibility which is probably related to recent intravenous iron administration. There is a 2 centimeter area of restricted diffusion in the inferior right parietal lobe posteriorly on series 5, image 54. There is associated gyriform  cytotoxic edema. No significant mass effect and no definite hemorrhage. Additional patchy 3-4 centimeter area of abnormal diffusion in the posterior right frontal lobe and right parietal lobe white matter on series 5, image 57 is isointense on ADC with mild T2 and FLAIR hyperintensity. No mass effect. No left hemisphere or posterior fossa restricted diffusion. No midline shift, mass effect, evidence of mass lesion, ventriculomegaly, extra-axial collection or extra-axial hemorrhage. Cervicomedullary junction and pituitary are within normal limits. Questionable small area of cortical encephalomalacia in the left superior parietal lobe on series 13, image 20. Small chronic lacunar infarct in the right caudate. Vascular: Major intracranial vascular flow voids are preserved. Skull and upper cervical spine: Not well evaluated in the absence of sagittal T1 weighted imaging. Sinuses/Orbits: Negative orbits. Trace fluid layering in the right sphenoid sinus. Other paranasal sinuses are well pneumatized. Chronic left lamina papyracea fracture suspected. Other: Trace left mastoid effusion. Trace retained secretions in the nasopharynx. The right mastoids are clear. Scalp and face soft tissues appear negative. MRA HEAD FINDINGS Severely limited by a combination of motion and vascular susceptibility artifact probably related to recent intravenous iron administration. The distal vertebral arteries and basilar artery appear to be patent. Both ICA siphons and carotid termini appear to be patent. The 1st order circle-of-Willis branches (A1, M1, and P1) appear to be patent bilaterally. IMPRESSION: 1. Study limited by suspected recent intravenous iron administration, and also motion artifact. 2. Positive for Acute 2 cm Infarct in the inferior right parietal lobe, Right MCA/PCA watershed area corresponding to the recent CT finding. No associated mass effect. 3. Superimposed patchy Subacute Infarct in the posterior Right MCA territory  (series 5, image 57). No mass effect. 4. Limited MRA suggests no intracranial large vessel occlusion. 5. Chronic lacunar infarct right caudate nucleus. Electronically Signed   By: Genevie Ann M.D.   On: 06/01/2018 07:02   Mr Brain Wo Contrast  Result Date: 06/01/2018 CLINICAL DATA:  58 year old female with heart failure, iron deficiency anemia, chronic kidney disease. Weakness, encephalopathy. EXAM: MRI HEAD WITHOUT CONTRAST MRA HEAD WITHOUT CONTRAST TECHNIQUE: Multiplanar, multiecho pulse sequences of the brain and surrounding structures were obtained without intravenous contrast. Angiographic images of the head were obtained using MRA technique without contrast. COMPARISON:  Head CT 05/31/2018 FINDINGS: MRI HEAD FINDINGS Brain: The examination had to be discontinued prior to completion due to patient motion. Sagittal T1 weighted imaging was not obtained, and other sequences are intermittently degraded by motion. Additionally, T2* imaging, time-of-flight MRA, and diffusion imaging to a lesser extent are degraded by extensive intravascular susceptibility which is probably related to recent intravenous iron administration. There is a 2 centimeter area of restricted diffusion in the inferior right parietal lobe posteriorly on series 5, image 54. There is associated gyriform cytotoxic edema. No significant mass effect and no definite hemorrhage. Additional patchy 3-4 centimeter area of abnormal diffusion in the posterior right frontal lobe and right parietal lobe white matter on  series 5, image 57 is isointense on ADC with mild T2 and FLAIR hyperintensity. No mass effect. No left hemisphere or posterior fossa restricted diffusion. No midline shift, mass effect, evidence of mass lesion, ventriculomegaly, extra-axial collection or extra-axial hemorrhage. Cervicomedullary junction and pituitary are within normal limits. Questionable small area of cortical encephalomalacia in the left superior parietal lobe on series 13,  image 20. Small chronic lacunar infarct in the right caudate. Vascular: Major intracranial vascular flow voids are preserved. Skull and upper cervical spine: Not well evaluated in the absence of sagittal T1 weighted imaging. Sinuses/Orbits: Negative orbits. Trace fluid layering in the right sphenoid sinus. Other paranasal sinuses are well pneumatized. Chronic left lamina papyracea fracture suspected. Other: Trace left mastoid effusion. Trace retained secretions in the nasopharynx. The right mastoids are clear. Scalp and face soft tissues appear negative. MRA HEAD FINDINGS Severely limited by a combination of motion and vascular susceptibility artifact probably related to recent intravenous iron administration. The distal vertebral arteries and basilar artery appear to be patent. Both ICA siphons and carotid termini appear to be patent. The 1st order circle-of-Willis branches (A1, M1, and P1) appear to be patent bilaterally. IMPRESSION: 1. Study limited by suspected recent intravenous iron administration, and also motion artifact. 2. Positive for Acute 2 cm Infarct in the inferior right parietal lobe, Right MCA/PCA watershed area corresponding to the recent CT finding. No associated mass effect. 3. Superimposed patchy Subacute Infarct in the posterior Right MCA territory (series 5, image 57). No mass effect. 4. Limited MRA suggests no intracranial large vessel occlusion. 5. Chronic lacunar infarct right caudate nucleus. Electronically Signed   By: Genevie Ann M.D.   On: 06/01/2018 07:02   Vas US Carotid (at [place] Only)  Result Date: 06/01/2018 Carotid Arterial Duplex Study Indications:       CVA. Risk Factors:      Hypertension. Other Factors:     Heart failure. Limitations:       patient has labored breathing; short neck; line on site;                    stiffness. Comparison Study:  No comparison study available Performing Technologist: Rudell Cobb  Examination Guidelines: A complete evaluation includes B-mode  imaging, spectral Doppler, color Doppler, and power Doppler as needed of all accessible portions of each vessel. Bilateral testing is considered an integral part of a complete examination. Limited examinations for reoccurring indications may be performed as noted.  Right Carotid Findings: +----------+-------+--------+--------+--------------------------------+--------+           PSV    EDV cm/sStenosisDescribe                        Comments           cm/s                                                            +----------+-------+--------+--------+--------------------------------+--------+ CCA Prox  152    31                                              tortuous +----------+-------+--------+--------+--------------------------------+--------+ CCA Distal69  24                                                       +----------+-------+--------+--------+--------------------------------+--------+ ICA Prox  70     25      1-39%   diffuse, hyperechoic and                                                  irregular                                +----------+-------+--------+--------+--------------------------------+--------+ ICA Mid   89     38                                                       +----------+-------+--------+--------+--------------------------------+--------+ ICA Distal134    42                                                       +----------+-------+--------+--------+--------------------------------+--------+ ECA       68     15                                                       +----------+-------+--------+--------+--------------------------------+--------+ +----------+--------+-------+--------+-------------------+           PSV cm/sEDV cmsDescribeArm Pressure (mmHG) +----------+--------+-------+--------+-------------------+ [id number]                                          +----------+--------+-------+--------+-------------------+ +---------+--------+---+--------+--+---------+ VertebralPSV cm/s101EDV cm/s38Antegrade +---------+--------+---+--------+--+---------+  Left Carotid Findings: +----------+--------+--------+--------+------------------+--------+           PSV cm/sEDV cm/sStenosisDescribe          Comments +----------+--------+--------+--------+------------------+--------+ CCA Prox  101     32                                         +----------+--------+--------+--------+------------------+--------+ CCA Distal93      20                                         +----------+--------+--------+--------+------------------+--------+ ICA Prox  84      24      1-39%   focal and calcific         +----------+--------+--------+--------+------------------+--------+ ICA Mid   102     37                                         +----------+--------+--------+--------+------------------+--------+  ICA Distal125     52                                         +----------+--------+--------+--------+------------------+--------+ ECA       93                                                 +----------+--------+--------+--------+------------------+--------+ +----------+--------+--------+--------+-------------------+ SubclavianPSV cm/sEDV cm/sDescribeArm Pressure (mmHG) +----------+--------+--------+--------+-------------------+           102                                         +----------+--------+--------+--------+-------------------+ +---------+--------+---+--------+--+---------+ VertebralPSV cm/s100EDV cm/s37Antegrade +---------+--------+---+--------+--+---------+ IJV extendedly enlarged.  Summary: Right Carotid: Velocities in the right ICA are consistent with a 1-39% stenosis. Left Carotid: Velocities in the left ICA are consistent with a 1-39% stenosis.               Left IJV extendedly enlarged. Vertebrals: Bilateral  vertebral arteries demonstrate antegrade flow. *See table(s) above for measurements and observations.     Preliminary    Vas [place] Lower Extremity Venous (dvt)  Result Date: 06/01/2018  Lower Venous Study Indications: Embolic stroke.  Limitations: Body habitus, line and Immobility. Comparison Study: No prior study available Performing Technologist: Rudell Cobb  Examination Guidelines: A complete evaluation includes B-mode imaging, spectral Doppler, color Doppler, and power Doppler as needed of all accessible portions of each vessel. Bilateral testing is considered an integral part of a complete examination. Limited examinations for reoccurring indications may be performed as noted.  Right Venous Findings: +---------+---------------+---------+-----------+----------+-------------------+          CompressibilityPhasicitySpontaneityPropertiesSummary             +---------+---------------+---------+-----------+----------+-------------------+ CFV      Full           Yes      Yes                                      +---------+---------------+---------+-----------+----------+-------------------+ SFJ      Full                                                             +---------+---------------+---------+-----------+----------+-------------------+ FV Prox  Full                                                             +---------+---------------+---------+-----------+----------+-------------------+ FV Mid   Full                                                             +---------+---------------+---------+-----------+----------+-------------------+  FV Distal                        Yes                  Unable to visualiz                                                        with compression    +---------+---------------+---------+-----------+----------+-------------------+ PFV      Full                                                              +---------+---------------+---------+-----------+----------+-------------------+ POP      Full           Yes      Yes                                      +---------+---------------+---------+-----------+----------+-------------------+ PTV      Full                                                             +---------+---------------+---------+-----------+----------+-------------------+ PERO     Full                                                             +---------+---------------+---------+-----------+----------+-------------------+  Left Venous Findings: +---------+---------------+---------+-----------+----------+-------------------+          CompressibilityPhasicitySpontaneityPropertiesSummary             +---------+---------------+---------+-----------+----------+-------------------+ CFV      Full           Yes      Yes                                      +---------+---------------+---------+-----------+----------+-------------------+ SFJ      Full                                                             +---------+---------------+---------+-----------+----------+-------------------+ FV Prox  Full                                                             +---------+---------------+---------+-----------+----------+-------------------+ FV Mid  Full                                                             +---------+---------------+---------+-----------+----------+-------------------+ FV DistalFull                                         Unable to visualiz                                                        with compression    +---------+---------------+---------+-----------+----------+-------------------+ PFV      Full                                                             +---------+---------------+---------+-----------+----------+-------------------+ POP      Full           Yes      Yes                                       +---------+---------------+---------+-----------+----------+-------------------+ PTV      Full                                                             +---------+---------------+---------+-----------+----------+-------------------+ PERO     Full                                                             +---------+---------------+---------+-----------+----------+-------------------+ Prominent lymph node see at groin area.    Summary: Right: There is no evidence of deep vein thrombosis in the lower extremity. However, portions of this examination were limited- see technologist comments above. No cystic structure found in the popliteal fossa. Left: There is no evidence of deep vein thrombosis in the lower extremity. However, portions of this examination were limited- see technologist comments above. No cystic structure found in the popliteal fossa.  *See table(s) above for measurements and observations.    Preliminary    2D Echocardiogram  - Left ventricle: The cavity size was normal. Systolic function was mildly to moderately reduced. The estimated ejection fraction was in the range of 40% to 45%. There is akinesis of the anteroseptal and apical myocardium. Features are consistent with a pseudonormal left ventricular filling pattern, with concomitant abnormal relaxation and increased filling pressure (grade 2 diastolic dysfunction). - Aortic valve: Trileaflet; mildly thickened, mildly calcified leaflets. - Left atrium: The atrium was  mildly dilated. - Right ventricle: The cavity size was moderately dilated. Wall thickness was normal. - Pulmonary arteries: Systolic pressure was severely increased. PA peak pressure: 71 mm Hg (S).   PHYSICAL EXAM Constitutional: obese, well-nourished Eyes: No scleral injection Head: Normocephalic.  Cardiovascular: Normal rate and regular rhythm.  Respiratory: Effort normal, non-labored breathing Skin: WDI  Neuro: Mental  Status: Patient is awake, alert, she is slow to answer questions. Very dysarthric. No aphasia noted - can follow 2 step commands and name obgects.  Initially unwilling to participate but became engaged during exam.  Cranial Nerves: II: She blinks to threat bilaterally. difficulty with counting fingers in any visual field. Pupils are equal, round, and reactive to light.   III,IV, VI: EOMI without ptosis or diploplia.  V: Facial sensation is symmetric to temperature VII: Facial movement is symmetric.  VIII: hearing is intact to voice X: Uvula elevates symmetrically XI: Shoulder shrug is symmetric. XII: tongue is midline without atrophy or fasciculations.  Motor: No arm drift. Strength equal BUE 4/5, BLE 4/5 in bed.  No orbiting. Decreased FMM.  Sensory: Sensation is symmetric to light touch and temperature in the arms and legs.  She does not extinguish to double simultaneous stimulation Cerebellar: Bilateral finger-nose-finger intact, no notable ataxia   ASSESSMENT/PLAN Ms. Tamara Silva is a 58 y.o. female who was admitted 12/30 with generalized weakness in setting of chroni iron deficiency anemia, Hgb 5.3, transfused, then developed CHF. In hopsital developed slurred speech. CT shows a small cortical R occipital parietal infarct.   Stroke:  right parietal and patchy R MCA territory infarcts embolic most likely related to undefined cardiac source  CT head mod acute to subacute R occipitoparietal cortex infarct. Old R caudate infarct.   MRI  Inferior R parietal lobe infarct, patchy subacute R MCA territory infarct. Old R caudate lacune  MRA  Limited. No LVO.  Carotid Doppler  B ICA 1-39% stenosis, VAs antegrade   2D Echo  EF 40-45%. No source of embolus. Severely increased pressure pulm arteries  LE venous dopplers no DVT found  LDL 43  Given patient not a good long-term AC candidate d/t chronic anemia, will not pursue further embolic inpatient workup (TEE, loop, hypercoagulable  labs) can consider possible 30-day monitor as an OP to look for AF.   SCDs for VTE prophylaxis  aspirin 81 mg daily and Effient prior to admission, now on No antithrombotic d/t anemia requiring transfusion. Effient is contraindicated post stroke. Cardiology also recommend to stop Effient in this current admission.  Given GIB, continue to hold aspirin. Recommend adding aspirin back once hgb stable. Of note, pt with poor response to plavix (unaffected p2Y inhib) in 2015, and do not recommend.  Therapy recommendations:  SNF  Disposition:  pending   Hypertension  Stable currently  As low as 98/50 last night . Permissive hypertension (OK if < 220/120) but gradually normalize in 5-7 days . Long-term BP goal normotensive  Hyperlipidemia  Home meds:  crestor 10  LDL 43, goal < 70  crestor currently on hold  Continue to hold statin or decrease dose given low LDL. Recheck in 4-6 weeks  Diabetes type II  HgbA1c 7.1, goal < 7.0  Other Stroke Risk Factors  Former Cigarette smoker, quit 4 yrs ago  Morbid Obesity, Body mass index is 50.92 kg/m., recommend weight loss, diet and exercise as appropriate   Family hx stroke (mother)  CAD w/ stents in 2015. Put on Effient when not responsive to plavix.  Combined  chronic systolic and diastolic Congestive heart failure  Hx PE 2014   Other Active Problems  Encephalopathy - multifactorial - AKI, CHF, anemia, hypoxic resp railure - likely not associated with stroke alone  Acute on chronic iron deficiency anemia - GI w/u neg. Transfused 2 units yest, 1 more scheduled for today. Plan OP GI eval. Hgb 7.4   Acute hypoxic respiratory failure in setting of heart failure exacerbation  AKI, improving Cr 1.39  Hospital day # 2  Tamara Sabin, MSN, APRN, ANVP-BC, AGPCNP-BC Advanced Practice Stroke Nurse [place] for Schedule & Pager information 06/01/2018 10:15 AM   ATTENDING NOTE: I reviewed above note and agree  with the assessment and plan. Pt was seen and examined.   58 year old female with CAD/MI status post stenting in 01/2014, PE in 2014, CHF, anemia, diabetes, hypertension, COPD, obesity admitted for generalized weakness and severe anemia.  She required blood transfusion, hemoglobin 5.3 up to 7.4.  However, patient was found to have lethargy, slurred speech and altered mental status.  CT showed right MCA cortical infarct.  MRI confirmed right MCA parietal acute and subacute infarcts.  MRA no LVO but severely motion degraded.  Carotid Doppler negative.  LDL 43 and A1c 7.1.  LE venous Doppler negative.  UDS pending.  Ammonia level 94.  EF 40 to 45% increase from 35 to 40% in 12/2017.  Creatinine 1.70 down to 1.39 after IV fluids.  Still has mild leukocytosis WBC 11.7.  Patient stroke embolic pattern, concerning for cardioembolic source given her extensive cardiac history with recent admission for CHF.  Patient also had PE in 2014, hypercoagulable work-up may be a consideration.  However, at the current situation, patient has severe anemia needing blood transfusion, she is not a candidate for anticoagulation.  Will defer embolic work-up at this time.  Recommend aspirin 81 for stroke prevention once patient anemia improved and hemoglobin stable.  She was also on Effient PTA for cardiac stenting 2015.  We agree with cardiology that it is not needed at this time.  Continue Crestor for stroke and cardiac prevention.  PT/OT recommend SNF.  She will follow with Dr. Leonie Man in stroke clinic at [facility] in 4 weeks consider further embolic work-up if anemia stable.  Of note, patient has elevated ammonia level which may partially explain her encephalopathy, recommend lactulose treatment and repeat ammonia testing.  Will defer to primary team for management.  Neurology will sign off. Please call with questions. Pt will follow up with Dr. Leonie Man in stroke clinic NP at [facility] in about 4 weeks. Thanks for the consult.  Rosalin Hawking, MD  PhD Stroke Neurology 06/01/2018 6:43 PM  I spent  35 minutes in total face-to-face time with the patient, more than 50% of which was spent in counseling and coordination of care, reviewing test results, images and medication, and discussing the diagnosis of right MCA stroke, severe anemia, extensive cardiac issues, treatment plan and potential prognosis. This patient's care requiresreview of multiple databases, neurological assessment, discussion with family, other specialists and medical decision making of high complexity.   To contact Stroke Continuity provider, please refer to [email or web address] After hours, contact General Neurology

## 2018-06-01 NOTE — Progress Notes (Signed)
Progress Note  Patient Name: Tamara Silva Date of Encounter: 06/01/2018  Primary Cardiologist: Loralie Champagne, MD   Subjective   Denies any complaints today. Less conversant but follows commands.  Inpatient Medications    Scheduled Meds: . carvedilol  3.125 mg Oral BID WC  . furosemide  80 mg Intravenous Q8H  . mouth rinse  15 mL Mouth Rinse BID  . pantoprazole  40 mg Oral Q1200   Continuous Infusions: . sodium chloride    . cefTRIAXone (ROCEPHIN)  IV Stopped (05/31/18 1836)   PRN Meds: sodium chloride, albuterol, hydrALAZINE, ondansetron **OR** ondansetron (ZOFRAN) IV   Vital Signs    Vitals:   05/31/18 2323 06/01/18 0435 06/01/18 0700 06/01/18 0831  BP: 112/71 125/69  128/68  Pulse: 77 79  84  Resp: (!) 31 (!) 29  20  Temp: 98.5 F (36.9 C) 98.1 F (36.7 C)  98.5 F (36.9 C)  TempSrc: Axillary Axillary  Axillary  SpO2: 92% 91% 100% 100%  Weight:      Height:        Intake/Output Summary (Last 24 hours) at 06/01/2018 1119 Last data filed at 06/01/2018 1010 Gross per 24 hour  Intake 680 ml  Output 4375 ml  Net -3695 ml   Filed Weights   05/30/18 0107 05/31/18 0500 05/31/18 2050  Weight: (!) 180.5 kg (!) 149.7 kg (!) 149.7 kg    Telemetry    SR- Personally Reviewed  ECG    NSR, low voltage precordial leads - Personally Reviewed  Physical Exam   GEN: No acute distress.  Resting comfortably in bed Neck: No JVD appreciated though difficult due to body habitus Cardiac: RRR, no murmurs, rubs, or gallops.  Respiratory: coarse breath sounds throughout with crackles at bases GI: Soft, nontender, non-distended  MS: trace BL LE edema; No deformity. Neuro:  Nonfocal  Psych: Normal affect   Labs    Chemistry Recent Labs  Lab 05/29/18 1052 05/30/18 0754 05/31/18 0337  NA 137 139 141  K 4.7 4.7 4.3  CL 96* 98 96*  CO2 35* 33* 35*  GLUCOSE 231* 137* 89  BUN 40* 41* 39*  CREATININE 1.78* 1.66* 1.70*  CALCIUM 8.3* 8.2* 8.2*  PROT 8.3*  --   --     ALBUMIN 2.8*  --   --   AST 21  --   --   ALT 13  --   --   ALKPHOS 102  --   --   BILITOT 0.7  --   --   GFRNONAA 31* 34* 33*  GFRAA 36* 39* 38*  ANIONGAP 6 8 10      Hematology Recent Labs  Lab 05/30/18 0754 05/31/18 0337 06/01/18 1024  WBC 12.3* 11.7* 7.5  RBC 3.75* 3.74* 3.68*  HGB 7.4* 7.3* 7.4*  HCT 29.3* 28.7* 28.3*  MCV 78.1* 76.7* 76.9*  MCH 19.7* 19.5* 20.1*  MCHC 25.3* 25.4* 26.1*  RDW 27.1* 28.2* 28.4*  PLT 484* 525* 507*    Cardiac Enzymes Recent Labs  Lab 05/29/18 1052 05/30/18 0126 05/30/18 0754 05/30/18 1300  TROPONINI <0.03 <0.03 <0.03 <0.03   No results for input(s): TROPIPOC in the last 168 hours.   BNP Recent Labs  Lab 05/29/18 1052  BNP 1,218.8*     DDimer No results for input(s): DDIMER in the last 168 hours.   Radiology    Ct Head Wo Contrast  Result Date: 05/31/2018 CLINICAL DATA:  Encephalopathy EXAM: CT HEAD WITHOUT CONTRAST TECHNIQUE: Contiguous axial images were obtained  from the base of the skull through the vertex without intravenous contrast. COMPARISON:  None. FINDINGS: Brain: Small area of cytotoxic edema appearance along the right occipital parietal convexity with questionable involvement more anteriorly at the level of the parietal lobe. No definite left-sided edema. No posterior fossa abnormality is seen. There is a lacunar infarct in the right caudate head that has a circumscribed low-density appearance suggesting chronicity on the coronal reformats. Vascular: Negative Skull: Negative Sinuses/Orbits: Negative These results were called by telephone at the time of interpretation on 05/31/2018 at 3:36 pm to Dr. Niel Hummer , who verbally acknowledged these results. IMPRESSION: 1. Moderate, acute to subacute cortex infarct at the right occipitoparietal convexity. 2. Remote lacunar infarct in the right caudate. 3. Motion degraded. Electronically Signed   By: Monte Fantasia M.D.   On: 05/31/2018 15:37   Mr Virgel Paling NT  Contrast  Result Date: 06/01/2018 CLINICAL DATA:  58 year old female with heart failure, iron deficiency anemia, chronic kidney disease. Weakness, encephalopathy. EXAM: MRI HEAD WITHOUT CONTRAST MRA HEAD WITHOUT CONTRAST TECHNIQUE: Multiplanar, multiecho pulse sequences of the brain and surrounding structures were obtained without intravenous contrast. Angiographic images of the head were obtained using MRA technique without contrast. COMPARISON:  Head CT 05/31/2018 FINDINGS: MRI HEAD FINDINGS Brain: The examination had to be discontinued prior to completion due to patient motion. Sagittal T1 weighted imaging was not obtained, and other sequences are intermittently degraded by motion. Additionally, T2* imaging, time-of-flight MRA, and diffusion imaging to a lesser extent are degraded by extensive intravascular susceptibility which is probably related to recent intravenous iron administration. There is a 2 centimeter area of restricted diffusion in the inferior right parietal lobe posteriorly on series 5, image 54. There is associated gyriform cytotoxic edema. No significant mass effect and no definite hemorrhage. Additional patchy 3-4 centimeter area of abnormal diffusion in the posterior right frontal lobe and right parietal lobe white matter on series 5, image 57 is isointense on ADC with mild T2 and FLAIR hyperintensity. No mass effect. No left hemisphere or posterior fossa restricted diffusion. No midline shift, mass effect, evidence of mass lesion, ventriculomegaly, extra-axial collection or extra-axial hemorrhage. Cervicomedullary junction and pituitary are within normal limits. Questionable small area of cortical encephalomalacia in the left superior parietal lobe on series 13, image 20. Small chronic lacunar infarct in the right caudate. Vascular: Major intracranial vascular flow voids are preserved. Skull and upper cervical spine: Not well evaluated in the absence of sagittal T1 weighted imaging.  Sinuses/Orbits: Negative orbits. Trace fluid layering in the right sphenoid sinus. Other paranasal sinuses are well pneumatized. Chronic left lamina papyracea fracture suspected. Other: Trace left mastoid effusion. Trace retained secretions in the nasopharynx. The right mastoids are clear. Scalp and face soft tissues appear negative. MRA HEAD FINDINGS Severely limited by a combination of motion and vascular susceptibility artifact probably related to recent intravenous iron administration. The distal vertebral arteries and basilar artery appear to be patent. Both ICA siphons and carotid termini appear to be patent. The 1st order circle-of-Willis branches (A1, M1, and P1) appear to be patent bilaterally. IMPRESSION: 1. Study limited by suspected recent intravenous iron administration, and also motion artifact. 2. Positive for Acute 2 cm Infarct in the inferior right parietal lobe, Right MCA/PCA watershed area corresponding to the recent CT finding. No associated mass effect. 3. Superimposed patchy Subacute Infarct in the posterior Right MCA territory (series 5, image 57). No mass effect. 4. Limited MRA suggests no intracranial large vessel occlusion. 5. Chronic lacunar  infarct right caudate nucleus. Electronically Signed   By: Genevie Ann M.D.   On: 06/01/2018 07:02   Mr Brain Wo Contrast  Result Date: 06/01/2018 CLINICAL DATA:  58 year old female with heart failure, iron deficiency anemia, chronic kidney disease. Weakness, encephalopathy. EXAM: MRI HEAD WITHOUT CONTRAST MRA HEAD WITHOUT CONTRAST TECHNIQUE: Multiplanar, multiecho pulse sequences of the brain and surrounding structures were obtained without intravenous contrast. Angiographic images of the head were obtained using MRA technique without contrast. COMPARISON:  Head CT 05/31/2018 FINDINGS: MRI HEAD FINDINGS Brain: The examination had to be discontinued prior to completion due to patient motion. Sagittal T1 weighted imaging was not obtained, and other  sequences are intermittently degraded by motion. Additionally, T2* imaging, time-of-flight MRA, and diffusion imaging to a lesser extent are degraded by extensive intravascular susceptibility which is probably related to recent intravenous iron administration. There is a 2 centimeter area of restricted diffusion in the inferior right parietal lobe posteriorly on series 5, image 54. There is associated gyriform cytotoxic edema. No significant mass effect and no definite hemorrhage. Additional patchy 3-4 centimeter area of abnormal diffusion in the posterior right frontal lobe and right parietal lobe white matter on series 5, image 57 is isointense on ADC with mild T2 and FLAIR hyperintensity. No mass effect. No left hemisphere or posterior fossa restricted diffusion. No midline shift, mass effect, evidence of mass lesion, ventriculomegaly, extra-axial collection or extra-axial hemorrhage. Cervicomedullary junction and pituitary are within normal limits. Questionable small area of cortical encephalomalacia in the left superior parietal lobe on series 13, image 20. Small chronic lacunar infarct in the right caudate. Vascular: Major intracranial vascular flow voids are preserved. Skull and upper cervical spine: Not well evaluated in the absence of sagittal T1 weighted imaging. Sinuses/Orbits: Negative orbits. Trace fluid layering in the right sphenoid sinus. Other paranasal sinuses are well pneumatized. Chronic left lamina papyracea fracture suspected. Other: Trace left mastoid effusion. Trace retained secretions in the nasopharynx. The right mastoids are clear. Scalp and face soft tissues appear negative. MRA HEAD FINDINGS Severely limited by a combination of motion and vascular susceptibility artifact probably related to recent intravenous iron administration. The distal vertebral arteries and basilar artery appear to be patent. Both ICA siphons and carotid termini appear to be patent. The 1st order circle-of-Willis  branches (A1, M1, and P1) appear to be patent bilaterally. IMPRESSION: 1. Study limited by suspected recent intravenous iron administration, and also motion artifact. 2. Positive for Acute 2 cm Infarct in the inferior right parietal lobe, Right MCA/PCA watershed area corresponding to the recent CT finding. No associated mass effect. 3. Superimposed patchy Subacute Infarct in the posterior Right MCA territory (series 5, image 57). No mass effect. 4. Limited MRA suggests no intracranial large vessel occlusion. 5. Chronic lacunar infarct right caudate nucleus. Electronically Signed   By: Genevie Ann M.D.   On: 06/01/2018 07:02    Cardiac Studies   Echo 05/31/2018 Study Conclusions - Left ventricle: The cavity size was normal. Systolic function was   mildly to moderately reduced. The estimated ejection fraction was   in the range of 40% to 45%. There is akinesis of the anteroseptal   and apical myocardium. Features are consistent with a   pseudonormal left ventricular filling pattern, with concomitant   abnormal relaxation and increased filling pressure (grade 2   diastolic dysfunction). - Aortic valve: Trileaflet; mildly thickened, mildly calcified   leaflets. - Left atrium: The atrium was mildly dilated. - Right ventricle: The cavity size was moderately  dilated. Wall   thickness was normal. - Pulmonary arteries: Systolic pressure was severely increased. PA   peak pressure: 71 mm Hg (S).  Patient Profile     58 y.o. female hx of COPD on home 02, DM-2, CAD with PCI at [facility] 2006 and [facility] in 2008 likely to the LAD, STEMI inf wall with DES to RCA 2015  HTN, HLD, h/o of chronic LLL PE, chromic anemia, and chronic systolic HF who is being followed for the evaluation of chronic CHF at the request of Dr. Tyrell Antonio.  Assessment & Plan   Altered mental status, acute hypercarbic respiratory failure: Less conversant today. Found to have small embolic stroke on CT, not thought to be sole etiology of her  encephalopathy.   Acute on chronic systolic heart failure:  -weight inaccurate -based on I/O, is 8L negative since admission -sodium and fluid restrict -continue low dose carvedilol -continue BID lasix  CAD with last stent in 2015: given anemia and need for transfusion, hold aspirin and prasugrel. -given stroke, would restart restart aspirin but not prasugrel when stable  CKD stage 3: Cr today 1.39, improved from admission   Anemia: per primary team. Receiving additional unit today, but no clear source of bleeding.  TIME SPENT WITH PATIENT: >15 minutes of direct patient care. More than 50% of that time was spent on coordination of care and counseling regarding heart failure.  Buford Dresser, MD, PhD [facility]   For questions or updates, please contact [place] Please consult [email or web address] for contact info under    Signed, Buford Dresser, MD  06/01/2018, 11:19 AM

## 2018-06-01 NOTE — Progress Notes (Signed)
Second split of blood transfusion completed at the said time. Patient tolerated it well.

## 2018-06-01 NOTE — Progress Notes (Signed)
Bilateral carotid duplex exam completed. Please see preliminary notes on CV PROC under chart review.  Lewanda Perea H Kaitrin Seybold(RDMS RVT) 06/01/18 12:24 PM

## 2018-06-02 LAB — TYPE AND SCREEN
ABO/RH(D): B POS
Antibody Screen: NEGATIVE

## 2018-06-02 LAB — GLUCOSE, CAPILLARY
GLUCOSE-CAPILLARY: 202 mg/dL — AB (ref 70–99)
Glucose-Capillary: 131 mg/dL — ABNORMAL HIGH (ref 70–99)
Glucose-Capillary: 134 mg/dL — ABNORMAL HIGH (ref 70–99)
Glucose-Capillary: 158 mg/dL — ABNORMAL HIGH (ref 70–99)
Glucose-Capillary: 164 mg/dL — ABNORMAL HIGH (ref 70–99)
Glucose-Capillary: 191 mg/dL — ABNORMAL HIGH (ref 70–99)
Glucose-Capillary: 223 mg/dL — ABNORMAL HIGH (ref 70–99)

## 2018-06-02 LAB — CBC
HCT: 30 % — ABNORMAL LOW (ref 36.0–46.0)
Hemoglobin: 7.6 g/dL — ABNORMAL LOW (ref 12.0–15.0)
MCH: 20.1 pg — ABNORMAL LOW (ref 26.0–34.0)
MCHC: 25.3 g/dL — ABNORMAL LOW (ref 30.0–36.0)
MCV: 79.2 fL — ABNORMAL LOW (ref 80.0–100.0)
Platelets: 470 10*3/uL — ABNORMAL HIGH (ref 150–400)
RBC: 3.79 MIL/uL — ABNORMAL LOW (ref 3.87–5.11)
RDW: 28.2 % — ABNORMAL HIGH (ref 11.5–15.5)
WBC: 7.2 10*3/uL (ref 4.0–10.5)
nRBC: 1.8 % — ABNORMAL HIGH (ref 0.0–0.2)

## 2018-06-02 LAB — BPAM RBC
Blood Product Expiration Date: 202001272359
Blood Product Expiration Date: 202001272359
ISSUE DATE / TIME: 202001021717
ISSUE DATE / TIME: 202001022110
Unit Type and Rh: 7300
Unit Type and Rh: 7300

## 2018-06-02 LAB — BASIC METABOLIC PANEL
Anion gap: 10 (ref 5–15)
BUN: 27 mg/dL — ABNORMAL HIGH (ref 6–20)
CO2: 36 mmol/L — ABNORMAL HIGH (ref 22–32)
Calcium: 8.2 mg/dL — ABNORMAL LOW (ref 8.9–10.3)
Chloride: 92 mmol/L — ABNORMAL LOW (ref 98–111)
Creatinine, Ser: 1.29 mg/dL — ABNORMAL HIGH (ref 0.44–1.00)
GFR calc Af Amer: 53 mL/min — ABNORMAL LOW (ref >60)
GFR calc non Af Amer: 46 mL/min — ABNORMAL LOW (ref >60)
Glucose, Bld: 144 mg/dL — ABNORMAL HIGH (ref 70–99)
Potassium: 4.2 mmol/L (ref 3.5–5.1)
Sodium: 138 mmol/L (ref 135–145)

## 2018-06-02 MED ORDER — INSULIN DETEMIR 100 UNIT/ML ~~LOC~~ SOLN
15.0000 [IU] | Freq: Every day | SUBCUTANEOUS | Status: DC
  Administered 2018-06-02 – 2018-06-09 (×8): 15 [IU] via SUBCUTANEOUS
  Filled 2018-06-02 (×10): qty 0.15

## 2018-06-02 NOTE — Plan of Care (Signed)
Max assist with adls 

## 2018-06-02 NOTE — NC FL2 (Signed)
[place] LEVEL OF CARE SCREENING TOOL     IDENTIFICATION  Patient Name: Tamara Silva Birthdate: 1961/01/13 Sex: female Admission Date (Current Location): 05/29/2018  [facility] and [place] Number:  Herbalist and Address:  [facility] [name]. [facility], [place] [address] [place], [place] [address]      Provider Number: [id number]  Attending Physician Name and Address:  [name], MD  Relative Name and Phone Number:       Current Level of Care: Hospital Recommended Level of Care: [place] Prior Approval Number:    Date Approved/Denied:   PASRR Number: Manual review  Discharge Plan: SNF    Current Diagnoses: Patient Active Problem List   Diagnosis Date Noted  . Cerebral embolism with cerebral infarction 06/01/2018  . AKI (acute kidney injury) ([place]) 05/30/2018  . Pressure injury of skin 05/30/2018  . Acute on chronic anemia 05/29/2018  . Chronic respiratory failure with hypoxia, on home O2 therapy ([place]) 01/27/2018  . Iron deficiency anemia 01/27/2018  . Tobacco abuse disorder 10/03/2017  . Dyslipidemia 04/29/2016  . Primary insomnia 04/29/2016  . Hypoxemia 09/08/2015  . CHF (congestive heart failure) ([place]) 09/08/2015  . Obesity hypoventilation syndrome ([place])   . Microcytic anemia   . Acute encephalopathy   . Uncontrolled type 2 diabetes mellitus with hyperosmolarity without coma ([place])   . Acute on chronic respiratory failure with hypercapnia ([place])   . CAP (community acquired pneumonia)   . OSA (obstructive sleep apnea)   . Acute on chronic respiratory failure ([place]) 06/22/2015  . Acute on chronic combined systolic (congestive) and diastolic (congestive) heart failure ([place]) 06/21/2015  . Hypoglycemia 06/21/2015  . Uncontrolled type 2 diabetes mellitus without complication, with long-term current use of insulin ([place])   . Dyslipidemia associated with type 2 diabetes mellitus ([place]) 05/21/2015  . Anemia of chronic kidney  failure 05/21/2015  . Hyperkalemia 05/21/2015  . COPD GOLD III  06/10/2014  . Acute respiratory failure with hypoxia ([place]) 03/04/2014  . CKD (chronic kidney disease), stage II 02/07/2014  . COPD exacerbation ([place]) 02/01/2014  . CAD S/P percutaneous coronary angioplasty - prior PCI to LAD; RCA PCI: new Xience Alpine DES 2.75 mm x 15 mm  10/07/2013  . Morbid obesity ([place]) 07/30/2013  . Chronic combined systolic and diastolic heart failure ([place]) 07/10/2013  . Chronic pulmonary embolism ([place]) 02/08/2013  . Bipolar disorder ([place]) 02/08/2013  . Essential hypertension 02/08/2013  . Chronic anticoagulation 11/27/2012  . Edema extremities 10/15/2012  . COPD (chronic obstructive pulmonary disease) ([place]) 10/15/2012  . Pulmonary embolism on right ([place]) 10/15/2012  . Hypertension 10/15/2012  . Insulin dependent diabetes mellitus ([place]) 10/15/2012    Orientation RESPIRATION BLADDER Height & Weight     Self, Time, Situation, Place  O2(see DC summary) Continent Weight: (!) 330 lb (149.7 kg) Height:  5' 7.5" (171.5 cm)  BEHAVIORAL SYMPTOMS/MOOD NEUROLOGICAL BOWEL NUTRITION STATUS      Incontinent Diet(see DC summary)  AMBULATORY STATUS COMMUNICATION OF NEEDS Skin   Extensive Assist Verbally PU Stage and Appropriate Care PU Stage 1 Dressing: (right elbow, foam dressing)                     Personal Care Assistance Level of Assistance  Bathing, Feeding, Dressing Bathing Assistance: Maximum assistance Feeding assistance: Limited assistance Dressing Assistance: Maximum assistance     Functional Limitations Info  Sight, Hearing, Speech Sight Info: Adequate(edema around eyes) Hearing Info: Adequate Speech Info: Impaired(dysarthria)  SPECIAL CARE FACTORS FREQUENCY  PT (By licensed PT), OT (By licensed OT)     PT Frequency: 5x/wk OT Frequency: 5x/wk            Contractures Contractures Info: Not present    Additional Factors Info  Code Status, Allergies, Insulin Sliding Scale  Code Status Info: Full Allergies Info: Metolazone, Plavix Clopidogrel Bisulfate, Ibuprofen, Nsaids   Insulin Sliding Scale Info: Levemir 15 units       Current Medications (06/02/2018):  This is the current hospital active medication list Current Facility-Administered Medications  Medication Dose Route Frequency Provider Last Rate Last Dose  . 0.9 %  sodium chloride infusion (Manually program via Guardrails IV Fluids)   Intravenous Once [name], MD      . 0.9 %  sodium chloride infusion   Intravenous PRN Regalado, Belkys A, MD      . acetaminophen (TYLENOL) tablet 650 mg  650 mg Oral Q6H PRN [name], MD   650 mg at 06/01/18 1645  . albuterol (PROVENTIL) (2.5 MG/3ML) 0.083% nebulizer solution 2.5 mg  2.5 mg Nebulization Q2H PRN Regalado, Belkys A, MD   2.5 mg at 05/29/18 2134  . carvedilol (COREG) tablet 3.125 mg  3.125 mg Oral BID WC Regalado, Belkys A, MD   3.125 mg at 06/02/18 0905  . furosemide (LASIX) injection 80 mg  80 mg Intravenous BID [name], MD      . hydrALAZINE (APRESOLINE) injection 5 mg  5 mg Intravenous Q6H PRN Regalado, Belkys A, MD   5 mg at 05/31/18 0107  . insulin detemir (LEVEMIR) injection 15 Units  15 Units Subcutaneous Daily [name], MD      . MEDLINE mouth rinse  15 mL Mouth Rinse BID Regalado, Belkys A, MD   15 mL at 06/02/18 0905  . ondansetron (ZOFRAN) tablet 4 mg  4 mg Oral Q6H PRN Regalado, Belkys A, MD       Or  . ondansetron (ZOFRAN) injection 4 mg  4 mg Intravenous Q6H PRN Regalado, Belkys A, MD   4 mg at 05/29/18 1850  . pantoprazole (PROTONIX) EC tablet 40 mg  40 mg Oral Q1200 [name], MD   40 mg at 06/02/18 1331  . potassium chloride SA (K-DUR,KLOR-CON) CR tablet 40 mEq  40 mEq Oral Daily [name], MD   40 mEq at 06/02/18 0786     Discharge Medications: Please see discharge summary for a list of discharge medications.  Relevant Imaging Results:  Relevant Lab Results:   Additional Information SS#:  [id number]  Geralynn Ochs, LCSW

## 2018-06-02 NOTE — Progress Notes (Signed)
PROGRESS NOTE    Tamara Silva  YWV:[id number] DOB: Feb 05, 1961 DOA: 05/29/2018 PCP: Antony Blackbird, MD    Brief Narrative: Tamara Silva is a 58 y.o. female with medical history significant for chronic iron deficiency anemia with baseline hemoglobin 8-9, type 2 diabetes mellitus, chronic combined systolic/diastolic heart failure, stage III chronic kidney disease with baseline creatinine of 1.2, was admitted to [facility] on 05/29/2018 with acute on chronic iron deficiency anemia/severe symptomatic anemia, after blood transfusion developed volume overload and respiratory failure, started on diuretics. [facility] course was complicated by encephalopathy and some speech disturbance which prompted an MRI this was positive for stroke, transferred to [facility] for neuro/stroke team eval   Assessment & Plan:   Acute hypoxic respiratory failure -Due to volume overload following blood transfusion; also has history of chronic systolic and diastolic CHF -Required BiPAP earlier this admission, now off, clinically improving with diuresis  -Continue IV Lasix 80 mg every 12,  potassium  -Off antibiotics -Oxygen weaned down to 4 L which is her baseline  Acute on chronic iron deficiency anemia -Longstanding history of same, prior negative gastroenterology work-up -No evidence of acute ongoing GI bleed, last EGD and colonoscopy in 2015 per [facility] unremarkable except for a few diverticuli -Received 3 units of PRBC this admission and given IV iron on 1/1 -Due to acute stroke and pulmonary edema/respiratory failure will hold off on repeat GI evaluation at this time  Acute on chronic systolic and diastolic CHF/CAD -Improving with diuresis, continue IV Lasix  80 mg Q12 -Continue carvedilol -Repeat echo, EF of 40 to 45% and some wall motion abnormality  Embolic stroke on CT -Suspect this is an incidental finding, had trace slurred speech yesterday -Follow-up echocardiogram, carotid duplex with no  significant stenosis -MRI brain noted acute 2 cm Infarct in the inferior right parietal lobe, Right MCA/PCA watershed area  -MRA without significant intracranial stenosis -Due to severe iron deficiency anemia not on antiplatelet agents this time, neurology recommends 81 mg aspirin when able to -Neurology following -PT/OT eval completed, SNF recommended -Will notify social worker  History of CAD -Last stent in 2015, cards following  AKI; cr baseline 1.2.  -Admission creatinine was 1.7, now improving to 1.39 -Monitor with diuresis  DM;  -Restart Levemir, continue sliding scale  DVT prophylaxis: scd Code Status: full code.  Family Communication: friend at bedside.  Disposition Plan: remain step down unit, needs SNF at discharge when stable  Consultants:   Cardiology  Neurology   Procedures: none  Antimicrobials: zosyn   Subjective: -More clear this morning, breathing overall improving  Objective: Vitals:   06/02/18 0450 06/02/18 0726 06/02/18 1136 06/02/18 1137  BP: 101/89 (!) 146/69  127/65  Pulse: 78 79 78 77  Resp: (!) 26 (!) 27 (!) 32 (!) 24  Temp:  97.7 F (36.5 C)  97.9 F (36.6 C)  TempSrc:  Oral  Oral  SpO2: 98% 100% 99% 100%  Weight:      Height:        Intake/Output Summary (Last 24 hours) at 06/02/2018 1304 Last data filed at 06/02/2018 0300 Gross per 24 hour  Intake 120 ml  Output 700 ml  Net -580 ml   Filed Weights   05/30/18 0107 05/31/18 0500 05/31/18 2050  Weight: (!) 180.5 kg (!) 149.7 kg (!) 149.7 kg    Examination:  Gen: Obese chronically ill appearing female laying in bed, no distress, alert awake oriented x3 HEENT: PERRLA, Neck supple, no JVD Lungs: Poor air movement,  few basilar crackles CVS: RRR,No Gallops,Rubs or new Murmurs Abd: soft, Non tender, non distended, BS present Extremities: No edema Skin: no new rashes Neuro: Moves all extremities, no localizing signs    Data Reviewed: I have personally reviewed following labs  and imaging studies  CBC: Recent Labs  Lab 05/29/18 1052 05/30/18 0126 05/30/18 0754 05/31/18 0337 06/01/18 1024 06/02/18 0518  WBC 8.3  --  12.3* 11.7* 7.5 7.2  NEUTROABS 5.5  --   --   --   --   --   HGB 5.7* 7.9* 7.4* 7.3* 7.4* 7.6*  HCT 24.2*  --  29.3* 28.7* 28.3* 30.0*  MCV 74.5*  --  78.1* 76.7* 76.9* 79.2*  PLT 495*  --  484* 525* 507* 798*   Basic Metabolic Panel: Recent Labs  Lab 05/29/18 1052 05/30/18 0754 05/31/18 0337 06/01/18 1024 06/02/18 0518  NA 137 139 141 139 138  K 4.7 4.7 4.3 3.9 4.2  CL 96* 98 96* 92* 92*  CO2 35* 33* 35* 38* 36*  GLUCOSE 231* 137* 89 131* 144*  BUN 40* 41* 39* 30* 27*  CREATININE 1.78* 1.66* 1.70* 1.39* 1.29*  CALCIUM 8.3* 8.2* 8.2* 8.3* 8.2*  MG  --  2.3  --   --   --    GFR: Estimated Creatinine Clearance: 73.2 mL/min (A) (by C-G formula based on SCr of 1.29 mg/dL (H)). Liver Function Tests: Recent Labs  Lab 05/29/18 1052  AST 21  ALT 13  ALKPHOS 102  BILITOT 0.7  PROT 8.3*  ALBUMIN 2.8*   No results for input(s): LIPASE, AMYLASE in the last 168 hours. Recent Labs  Lab 06/01/18 0323  AMMONIA 94*   Coagulation Profile: Recent Labs  Lab 05/30/18 0126  INR 1.41   Cardiac Enzymes: Recent Labs  Lab 05/29/18 1052 05/30/18 0126 05/30/18 0754 05/30/18 1300  TROPONINI <0.03 <0.03 <0.03 <0.03   BNP (last 3 results) No results for input(s): PROBNP in the last 8760 hours. HbA1C: Recent Labs    06/01/18 1024  HGBA1C 7.1*   CBG: Recent Labs  Lab 06/01/18 1613 06/02/18 0014 06/02/18 0446 06/02/18 0722 06/02/18 1110  GLUCAP 121* 202* 134* 158* 223*   Lipid Profile: Recent Labs    06/01/18 1024  CHOL 83  HDL 25*  LDLCALC 43  TRIG 77  CHOLHDL 3.3   Thyroid Function Tests: No results for input(s): TSH, T4TOTAL, FREET4, T3FREE, THYROIDAB in the last 72 hours. Anemia Panel: No results for input(s): VITAMINB12, FOLATE, FERRITIN, TIBC, IRON, RETICCTPCT in the last 72 hours. Sepsis Labs: No  results for input(s): PROCALCITON, LATICACIDVEN in the last 168 hours.  Recent Results (from the past 240 hour(s))  MRSA PCR Screening     Status: None   Collection Time: 05/30/18 12:09 AM  Result Value Ref Range Status   MRSA by PCR NEGATIVE NEGATIVE Final    Comment:        The GeneXpert MRSA Assay (FDA approved for NASAL specimens only), is one component of a comprehensive MRSA colonization surveillance program. It is not intended to diagnose MRSA infection nor to guide or monitor treatment for MRSA infections. Performed at [facility], [place] [address], [place], [place] [address]          Radiology Studies: Ct Head Wo Contrast  Result Date: 05/31/2018 CLINICAL DATA:  Encephalopathy EXAM: CT HEAD WITHOUT CONTRAST TECHNIQUE: Contiguous axial images were obtained from the base of the skull through the vertex without intravenous contrast. COMPARISON:  None. FINDINGS:  Brain: Small area of cytotoxic edema appearance along the right occipital parietal convexity with questionable involvement more anteriorly at the level of the parietal lobe. No definite left-sided edema. No posterior fossa abnormality is seen. There is a lacunar infarct in the right caudate head that has a circumscribed low-density appearance suggesting chronicity on the coronal reformats. Vascular: Negative Skull: Negative Sinuses/Orbits: Negative These results were called by telephone at the time of interpretation on 05/31/2018 at 3:36 pm to Dr. Niel Hummer , who verbally acknowledged these results. IMPRESSION: 1. Moderate, acute to subacute cortex infarct at the right occipitoparietal convexity. 2. Remote lacunar infarct in the right caudate. 3. Motion degraded. Electronically Signed   By: Monte Fantasia M.D.   On: 05/31/2018 15:37   Mr Virgel [place] Contrast  Result Date: 06/01/2018 CLINICAL DATA:  58 year old female with heart failure, iron deficiency anemia, chronic kidney disease. Weakness,  encephalopathy. EXAM: MRI HEAD WITHOUT CONTRAST MRA HEAD WITHOUT CONTRAST TECHNIQUE: Multiplanar, multiecho pulse sequences of the brain and surrounding structures were obtained without intravenous contrast. Angiographic images of the head were obtained using MRA technique without contrast. COMPARISON:  Head CT 05/31/2018 FINDINGS: MRI HEAD FINDINGS Brain: The examination had to be discontinued prior to completion due to patient motion. Sagittal T1 weighted imaging was not obtained, and other sequences are intermittently degraded by motion. Additionally, T2* imaging, time-of-flight MRA, and diffusion imaging to a lesser extent are degraded by extensive intravascular susceptibility which is probably related to recent intravenous iron administration. There is a 2 centimeter area of restricted diffusion in the inferior right parietal lobe posteriorly on series 5, image 54. There is associated gyriform cytotoxic edema. No significant mass effect and no definite hemorrhage. Additional patchy 3-4 centimeter area of abnormal diffusion in the posterior right frontal lobe and right parietal lobe white matter on series 5, image 57 is isointense on ADC with mild T2 and FLAIR hyperintensity. No mass effect. No left hemisphere or posterior fossa restricted diffusion. No midline shift, mass effect, evidence of mass lesion, ventriculomegaly, extra-axial collection or extra-axial hemorrhage. Cervicomedullary junction and pituitary are within normal limits. Questionable small area of cortical encephalomalacia in the left superior parietal lobe on series 13, image 20. Small chronic lacunar infarct in the right caudate. Vascular: Major intracranial vascular flow voids are preserved. Skull and upper cervical spine: Not well evaluated in the absence of sagittal T1 weighted imaging. Sinuses/Orbits: Negative orbits. Trace fluid layering in the right sphenoid sinus. Other paranasal sinuses are well pneumatized. Chronic left lamina  papyracea fracture suspected. Other: Trace left mastoid effusion. Trace retained secretions in the nasopharynx. The right mastoids are clear. Scalp and face soft tissues appear negative. MRA HEAD FINDINGS Severely limited by a combination of motion and vascular susceptibility artifact probably related to recent intravenous iron administration. The distal vertebral arteries and basilar artery appear to be patent. Both ICA siphons and carotid termini appear to be patent. The 1st order circle-of-Willis branches (A1, M1, and P1) appear to be patent bilaterally. IMPRESSION: 1. Study limited by suspected recent intravenous iron administration, and also motion artifact. 2. Positive for Acute 2 cm Infarct in the inferior right parietal lobe, Right MCA/PCA watershed area corresponding to the recent CT finding. No associated mass effect. 3. Superimposed patchy Subacute Infarct in the posterior Right MCA territory (series 5, image 57). No mass effect. 4. Limited MRA suggests no intracranial large vessel occlusion. 5. Chronic lacunar infarct right caudate nucleus. Electronically Signed   By: Genevie Ann M.D.   On:  06/01/2018 07:02   Mr Brain Wo Contrast  Result Date: 06/01/2018 CLINICAL DATA:  58 year old female with heart failure, iron deficiency anemia, chronic kidney disease. Weakness, encephalopathy. EXAM: MRI HEAD WITHOUT CONTRAST MRA HEAD WITHOUT CONTRAST TECHNIQUE: Multiplanar, multiecho pulse sequences of the brain and surrounding structures were obtained without intravenous contrast. Angiographic images of the head were obtained using MRA technique without contrast. COMPARISON:  Head CT 05/31/2018 FINDINGS: MRI HEAD FINDINGS Brain: The examination had to be discontinued prior to completion due to patient motion. Sagittal T1 weighted imaging was not obtained, and other sequences are intermittently degraded by motion. Additionally, T2* imaging, time-of-flight MRA, and diffusion imaging to a lesser extent are degraded by  extensive intravascular susceptibility which is probably related to recent intravenous iron administration. There is a 2 centimeter area of restricted diffusion in the inferior right parietal lobe posteriorly on series 5, image 54. There is associated gyriform cytotoxic edema. No significant mass effect and no definite hemorrhage. Additional patchy 3-4 centimeter area of abnormal diffusion in the posterior right frontal lobe and right parietal lobe white matter on series 5, image 57 is isointense on ADC with mild T2 and FLAIR hyperintensity. No mass effect. No left hemisphere or posterior fossa restricted diffusion. No midline shift, mass effect, evidence of mass lesion, ventriculomegaly, extra-axial collection or extra-axial hemorrhage. Cervicomedullary junction and pituitary are within normal limits. Questionable small area of cortical encephalomalacia in the left superior parietal lobe on series 13, image 20. Small chronic lacunar infarct in the right caudate. Vascular: Major intracranial vascular flow voids are preserved. Skull and upper cervical spine: Not well evaluated in the absence of sagittal T1 weighted imaging. Sinuses/Orbits: Negative orbits. Trace fluid layering in the right sphenoid sinus. Other paranasal sinuses are well pneumatized. Chronic left lamina papyracea fracture suspected. Other: Trace left mastoid effusion. Trace retained secretions in the nasopharynx. The right mastoids are clear. Scalp and face soft tissues appear negative. MRA HEAD FINDINGS Severely limited by a combination of motion and vascular susceptibility artifact probably related to recent intravenous iron administration. The distal vertebral arteries and basilar artery appear to be patent. Both ICA siphons and carotid termini appear to be patent. The 1st order circle-of-Willis branches (A1, M1, and P1) appear to be patent bilaterally. IMPRESSION: 1. Study limited by suspected recent intravenous iron administration, and also  motion artifact. 2. Positive for Acute 2 cm Infarct in the inferior right parietal lobe, Right MCA/PCA watershed area corresponding to the recent CT finding. No associated mass effect. 3. Superimposed patchy Subacute Infarct in the posterior Right MCA territory (series 5, image 57). No mass effect. 4. Limited MRA suggests no intracranial large vessel occlusion. 5. Chronic lacunar infarct right caudate nucleus. Electronically Signed   By: Genevie Ann M.D.   On: 06/01/2018 07:02   Vas US Carotid (at [place] Only)  Result Date: 06/01/2018 Carotid Arterial Duplex Study Indications:       CVA. Risk Factors:      Hypertension. Other Factors:     Heart failure. Limitations:       patient has labored breathing; short neck; line on site;                    stiffness. Comparison Study:  No comparison study available Performing Technologist: Rudell Cobb  Examination Guidelines: A complete evaluation includes B-mode imaging, spectral Doppler, color Doppler, and power Doppler as needed of all accessible portions of each vessel. Bilateral testing is considered an integral part of a complete  examination. Limited examinations for reoccurring indications may be performed as noted.  Right Carotid Findings: +----------+-------+--------+--------+--------------------------------+--------+           PSV    EDV cm/sStenosisDescribe                        Comments           cm/s                                                            +----------+-------+--------+--------+--------------------------------+--------+ CCA Prox  152    31                                              tortuous +----------+-------+--------+--------+--------------------------------+--------+ CCA Distal69     24                                                       +----------+-------+--------+--------+--------------------------------+--------+ ICA Prox  70     25      1-39%   diffuse, hyperechoic and                                                   irregular                                +----------+-------+--------+--------+--------------------------------+--------+ ICA Mid   89     38                                                       +----------+-------+--------+--------+--------------------------------+--------+ ICA Distal134    42                                                       +----------+-------+--------+--------+--------------------------------+--------+ ECA       68     15                                                       +----------+-------+--------+--------+--------------------------------+--------+ +----------+--------+-------+--------+-------------------+           PSV cm/sEDV cmsDescribeArm Pressure (mmHG) +----------+--------+-------+--------+-------------------+ [id number]                                         +----------+--------+-------+--------+-------------------+ +---------+--------+---+--------+--+---------+  VertebralPSV cm/s101EDV cm/s38Antegrade +---------+--------+---+--------+--+---------+  Left Carotid Findings: +----------+--------+--------+--------+------------------+--------+           PSV cm/sEDV cm/sStenosisDescribe          Comments +----------+--------+--------+--------+------------------+--------+ CCA Prox  101     32                                         +----------+--------+--------+--------+------------------+--------+ CCA Distal93      20                                         +----------+--------+--------+--------+------------------+--------+ ICA Prox  84      24      1-39%   focal and calcific         +----------+--------+--------+--------+------------------+--------+ ICA Mid   102     37                                         +----------+--------+--------+--------+------------------+--------+ ICA Distal125     52                                          +----------+--------+--------+--------+------------------+--------+ ECA       93                                                 +----------+--------+--------+--------+------------------+--------+ +----------+--------+--------+--------+-------------------+ SubclavianPSV cm/sEDV cm/sDescribeArm Pressure (mmHG) +----------+--------+--------+--------+-------------------+           102                                         +----------+--------+--------+--------+-------------------+ +---------+--------+---+--------+--+---------+ VertebralPSV cm/s100EDV cm/s37Antegrade +---------+--------+---+--------+--+---------+ IJV extendedly enlarged.  Summary: Right Carotid: Velocities in the right ICA are consistent with a 1-39% stenosis. Left Carotid: Velocities in the left ICA are consistent with a 1-39% stenosis.               Left IJV extendedly enlarged. Vertebrals: Bilateral vertebral arteries demonstrate antegrade flow. *See table(s) above for measurements and observations.  Electronically signed by Curt Jews MD on 06/01/2018 at 5:41:51 PM.    Final    Vas [place] Lower Extremity Venous (dvt)  Result Date: 06/01/2018  Lower Venous Study Indications: Embolic stroke.  Limitations: Body habitus, line and Immobility. Comparison Study: No prior study available Performing Technologist: Rudell Cobb  Examination Guidelines: A complete evaluation includes B-mode imaging, spectral Doppler, color Doppler, and power Doppler as needed of all accessible portions of each vessel. Bilateral testing is considered an integral part of a complete examination. Limited examinations for reoccurring indications may be performed as noted.  Right Venous Findings: +---------+---------------+---------+-----------+----------+-------------------+          CompressibilityPhasicitySpontaneityPropertiesSummary             +---------+---------------+---------+-----------+----------+-------------------+ CFV      Full            Yes      Yes                                      +---------+---------------+---------+-----------+----------+-------------------+  SFJ      Full                                                             +---------+---------------+---------+-----------+----------+-------------------+ FV Prox  Full                                                             +---------+---------------+---------+-----------+----------+-------------------+ FV Mid   Full                                                             +---------+---------------+---------+-----------+----------+-------------------+ FV Distal                        Yes                  Unable to visualiz                                                        with compression    +---------+---------------+---------+-----------+----------+-------------------+ PFV      Full                                                             +---------+---------------+---------+-----------+----------+-------------------+ POP      Full           Yes      Yes                                      +---------+---------------+---------+-----------+----------+-------------------+ PTV      Full                                                             +---------+---------------+---------+-----------+----------+-------------------+ PERO     Full                                                             +---------+---------------+---------+-----------+----------+-------------------+  Left Venous Findings: +---------+---------------+---------+-----------+----------+-------------------+          CompressibilityPhasicitySpontaneityPropertiesSummary             +---------+---------------+---------+-----------+----------+-------------------+ CFV  Full           Yes      Yes                                      +---------+---------------+---------+-----------+----------+-------------------+ SFJ       Full                                                             +---------+---------------+---------+-----------+----------+-------------------+ FV Prox  Full                                                             +---------+---------------+---------+-----------+----------+-------------------+ FV Mid   Full                                                             +---------+---------------+---------+-----------+----------+-------------------+ FV DistalFull                                         Unable to visualiz                                                        with compression    +---------+---------------+---------+-----------+----------+-------------------+ PFV      Full                                                             +---------+---------------+---------+-----------+----------+-------------------+ POP      Full           Yes      Yes                                      +---------+---------------+---------+-----------+----------+-------------------+ PTV      Full                                                             +---------+---------------+---------+-----------+----------+-------------------+ PERO     Full                                                             +---------+---------------+---------+-----------+----------+-------------------+  Prominent lymph node see at groin area.    Summary: Right: There is no evidence of deep vein thrombosis in the lower extremity. However, portions of this examination were limited- see technologist comments above. No cystic structure found in the popliteal fossa. Left: There is no evidence of deep vein thrombosis in the lower extremity. However, portions of this examination were limited- see technologist comments above. No cystic structure found in the popliteal fossa.  *See table(s) above for measurements and observations. Electronically signed by Curt Jews MD on 06/01/2018  at 5:41:34 PM.    Final         Scheduled Meds: . sodium chloride   Intravenous Once  . carvedilol  3.125 mg Oral BID WC  . furosemide  80 mg Intravenous BID  . mouth rinse  15 mL Mouth Rinse BID  . pantoprazole  40 mg Oral Q1200  . potassium chloride  40 mEq Oral Daily   Continuous Infusions: . sodium chloride       LOS: 3 days    Time spent: 25 minutes    [name], MD [facility] Page via [email or web address]  If 7PM-7AM, please contact night-coverage [email or web address] Password [email or web address] 06/02/2018, 1:04 PM

## 2018-06-02 NOTE — Progress Notes (Signed)
Progress Note  Patient Name: Tamara Silva Date of Encounter: 06/02/2018  Primary Cardiologist: Loralie Champagne, MD   Subjective   Conversant today, though somewhat off topic and wandering. Main complaint is lack of sleep. Intermittently tearful. Breathing stable, no chest pain.  Inpatient Medications    Scheduled Meds: . sodium chloride   Intravenous Once  . carvedilol  3.125 mg Oral BID WC  . furosemide  80 mg Intravenous BID  . mouth rinse  15 mL Mouth Rinse BID  . pantoprazole  40 mg Oral Q1200  . potassium chloride  40 mEq Oral Daily   Continuous Infusions: . sodium chloride     PRN Meds: sodium chloride, acetaminophen, albuterol, hydrALAZINE, ondansetron **OR** ondansetron (ZOFRAN) IV   Vital Signs    Vitals:   06/01/18 2155 06/01/18 2335 06/02/18 0450 06/02/18 0726  BP: 110/60 112/65 101/89 (!) 146/69  Pulse: 73 73 78 79  Resp: (!) 24 (!) 23 (!) 26 (!) 27  Temp: 97.9 F (36.6 C) 98 F (36.7 C)  97.7 F (36.5 C)  TempSrc: Oral Oral  Oral  SpO2: 100% 97% 98% 100%  Weight:      Height:        Intake/Output Summary (Last 24 hours) at 06/02/2018 0908 Last data filed at 06/02/2018 0300 Gross per 24 hour  Intake 360 ml  Output 1350 ml  Net -990 ml   Filed Weights   05/30/18 0107 05/31/18 0500 05/31/18 2050  Weight: (!) 180.5 kg (!) 149.7 kg (!) 149.7 kg    Telemetry    SR with occasional PVCs and artifact- Personally Reviewed  ECG    NSR, low voltage precordial leads 12/31- Personally Reviewed  Physical Exam   GEN: No acute distress.  Resting comfortably in bed. [place] in place Neck: No JVD appreciated though difficult due to body habitus Cardiac: RRR, no murmurs, rubs, or gallops.  Respiratory: diffuse coarse breath sounds throughout with crackles at bases GI: Soft, nontender, non-distended  MS: trace BL LE edema; No deformity. Neuro:  Nonfocal  Psych: Normal affect   Labs    Chemistry Recent Labs  Lab 05/29/18 1052  05/31/18 0337  06/01/18 1024 06/02/18 0518  NA 137   < > 141 139 138  K 4.7   < > 4.3 3.9 4.2  CL 96*   < > 96* 92* 92*  CO2 35*   < > 35* 38* 36*  GLUCOSE 231*   < > 89 131* 144*  BUN 40*   < > 39* 30* 27*  CREATININE 1.78*   < > 1.70* 1.39* 1.29*  CALCIUM 8.3*   < > 8.2* 8.3* 8.2*  PROT 8.3*  --   --   --   --   ALBUMIN 2.8*  --   --   --   --   AST 21  --   --   --   --   ALT 13  --   --   --   --   ALKPHOS 102  --   --   --   --   BILITOT 0.7  --   --   --   --   GFRNONAA 31*   < > 33* 42* 46*  GFRAA 36*   < > 38* 48* 53*  ANIONGAP 6   < > 10 9 10    < > = values in this interval not displayed.     Hematology Recent Labs  Lab 05/31/18 [phone] 06/01/18 1024 06/02/18  0518  WBC 11.7* 7.5 7.2  RBC 3.74* 3.68* 3.79*  HGB 7.3* 7.4* 7.6*  HCT 28.7* 28.3* 30.0*  MCV 76.7* 76.9* 79.2*  MCH 19.5* 20.1* 20.1*  MCHC 25.4* 26.1* 25.3*  RDW 28.2* 28.4* 28.2*  PLT 525* 507* 470*    Cardiac Enzymes Recent Labs  Lab 05/29/18 1052 05/30/18 0126 05/30/18 0754 05/30/18 1300  TROPONINI <0.03 <0.03 <0.03 <0.03   No results for input(s): TROPIPOC in the last 168 hours.   BNP Recent Labs  Lab 05/29/18 1052  BNP 1,218.8*     DDimer No results for input(s): DDIMER in the last 168 hours.   Radiology    Ct Head Wo Contrast  Result Date: 05/31/2018 CLINICAL DATA:  Encephalopathy EXAM: CT HEAD WITHOUT CONTRAST TECHNIQUE: Contiguous axial images were obtained from the base of the skull through the vertex without intravenous contrast. COMPARISON:  None. FINDINGS: Brain: Small area of cytotoxic edema appearance along the right occipital parietal convexity with questionable involvement more anteriorly at the level of the parietal lobe. No definite left-sided edema. No posterior fossa abnormality is seen. There is a lacunar infarct in the right caudate head that has a circumscribed low-density appearance suggesting chronicity on the coronal reformats. Vascular: Negative Skull: Negative Sinuses/Orbits:  Negative These results were called by telephone at the time of interpretation on 05/31/2018 at 3:36 pm to Dr. Niel Hummer , who verbally acknowledged these results. IMPRESSION: 1. Moderate, acute to subacute cortex infarct at the right occipitoparietal convexity. 2. Remote lacunar infarct in the right caudate. 3. Motion degraded. Electronically Signed   By: Monte Fantasia M.D.   On: 05/31/2018 15:37   Mr Virgel Paling LT Contrast  Result Date: 06/01/2018 CLINICAL DATA:  58 year old female with heart failure, iron deficiency anemia, chronic kidney disease. Weakness, encephalopathy. EXAM: MRI HEAD WITHOUT CONTRAST MRA HEAD WITHOUT CONTRAST TECHNIQUE: Multiplanar, multiecho pulse sequences of the brain and surrounding structures were obtained without intravenous contrast. Angiographic images of the head were obtained using MRA technique without contrast. COMPARISON:  Head CT 05/31/2018 FINDINGS: MRI HEAD FINDINGS Brain: The examination had to be discontinued prior to completion due to patient motion. Sagittal T1 weighted imaging was not obtained, and other sequences are intermittently degraded by motion. Additionally, T2* imaging, time-of-flight MRA, and diffusion imaging to a lesser extent are degraded by extensive intravascular susceptibility which is probably related to recent intravenous iron administration. There is a 2 centimeter area of restricted diffusion in the inferior right parietal lobe posteriorly on series 5, image 54. There is associated gyriform cytotoxic edema. No significant mass effect and no definite hemorrhage. Additional patchy 3-4 centimeter area of abnormal diffusion in the posterior right frontal lobe and right parietal lobe white matter on series 5, image 57 is isointense on ADC with mild T2 and FLAIR hyperintensity. No mass effect. No left hemisphere or posterior fossa restricted diffusion. No midline shift, mass effect, evidence of mass lesion, ventriculomegaly, extra-axial collection or  extra-axial hemorrhage. Cervicomedullary junction and pituitary are within normal limits. Questionable small area of cortical encephalomalacia in the left superior parietal lobe on series 13, image 20. Small chronic lacunar infarct in the right caudate. Vascular: Major intracranial vascular flow voids are preserved. Skull and upper cervical spine: Not well evaluated in the absence of sagittal T1 weighted imaging. Sinuses/Orbits: Negative orbits. Trace fluid layering in the right sphenoid sinus. Other paranasal sinuses are well pneumatized. Chronic left lamina papyracea fracture suspected. Other: Trace left mastoid effusion. Trace retained secretions in the nasopharynx. The right  mastoids are clear. Scalp and face soft tissues appear negative. MRA HEAD FINDINGS Severely limited by a combination of motion and vascular susceptibility artifact probably related to recent intravenous iron administration. The distal vertebral arteries and basilar artery appear to be patent. Both ICA siphons and carotid termini appear to be patent. The 1st order circle-of-Willis branches (A1, M1, and P1) appear to be patent bilaterally. IMPRESSION: 1. Study limited by suspected recent intravenous iron administration, and also motion artifact. 2. Positive for Acute 2 cm Infarct in the inferior right parietal lobe, Right MCA/PCA watershed area corresponding to the recent CT finding. No associated mass effect. 3. Superimposed patchy Subacute Infarct in the posterior Right MCA territory (series 5, image 57). No mass effect. 4. Limited MRA suggests no intracranial large vessel occlusion. 5. Chronic lacunar infarct right caudate nucleus. Electronically Signed   By: Genevie Ann M.D.   On: 06/01/2018 07:02   Mr Brain Wo Contrast  Result Date: 06/01/2018 CLINICAL DATA:  58 year old female with heart failure, iron deficiency anemia, chronic kidney disease. Weakness, encephalopathy. EXAM: MRI HEAD WITHOUT CONTRAST MRA HEAD WITHOUT CONTRAST TECHNIQUE:  Multiplanar, multiecho pulse sequences of the brain and surrounding structures were obtained without intravenous contrast. Angiographic images of the head were obtained using MRA technique without contrast. COMPARISON:  Head CT 05/31/2018 FINDINGS: MRI HEAD FINDINGS Brain: The examination had to be discontinued prior to completion due to patient motion. Sagittal T1 weighted imaging was not obtained, and other sequences are intermittently degraded by motion. Additionally, T2* imaging, time-of-flight MRA, and diffusion imaging to a lesser extent are degraded by extensive intravascular susceptibility which is probably related to recent intravenous iron administration. There is a 2 centimeter area of restricted diffusion in the inferior right parietal lobe posteriorly on series 5, image 54. There is associated gyriform cytotoxic edema. No significant mass effect and no definite hemorrhage. Additional patchy 3-4 centimeter area of abnormal diffusion in the posterior right frontal lobe and right parietal lobe white matter on series 5, image 57 is isointense on ADC with mild T2 and FLAIR hyperintensity. No mass effect. No left hemisphere or posterior fossa restricted diffusion. No midline shift, mass effect, evidence of mass lesion, ventriculomegaly, extra-axial collection or extra-axial hemorrhage. Cervicomedullary junction and pituitary are within normal limits. Questionable small area of cortical encephalomalacia in the left superior parietal lobe on series 13, image 20. Small chronic lacunar infarct in the right caudate. Vascular: Major intracranial vascular flow voids are preserved. Skull and upper cervical spine: Not well evaluated in the absence of sagittal T1 weighted imaging. Sinuses/Orbits: Negative orbits. Trace fluid layering in the right sphenoid sinus. Other paranasal sinuses are well pneumatized. Chronic left lamina papyracea fracture suspected. Other: Trace left mastoid effusion. Trace retained secretions  in the nasopharynx. The right mastoids are clear. Scalp and face soft tissues appear negative. MRA HEAD FINDINGS Severely limited by a combination of motion and vascular susceptibility artifact probably related to recent intravenous iron administration. The distal vertebral arteries and basilar artery appear to be patent. Both ICA siphons and carotid termini appear to be patent. The 1st order circle-of-Willis branches (A1, M1, and P1) appear to be patent bilaterally. IMPRESSION: 1. Study limited by suspected recent intravenous iron administration, and also motion artifact. 2. Positive for Acute 2 cm Infarct in the inferior right parietal lobe, Right MCA/PCA watershed area corresponding to the recent CT finding. No associated mass effect. 3. Superimposed patchy Subacute Infarct in the posterior Right MCA territory (series 5, image 57). No mass effect. 4.  Limited MRA suggests no intracranial large vessel occlusion. 5. Chronic lacunar infarct right caudate nucleus. Electronically Signed   By: Genevie Ann M.D.   On: 06/01/2018 07:02   Vas US Carotid (at [place] Only)  Result Date: 06/01/2018 Carotid Arterial Duplex Study Indications:       CVA. Risk Factors:      Hypertension. Other Factors:     Heart failure. Limitations:       patient has labored breathing; short neck; line on site;                    stiffness. Comparison Study:  No comparison study available Performing Technologist: Rudell Cobb  Examination Guidelines: A complete evaluation includes B-mode imaging, spectral Doppler, color Doppler, and power Doppler as needed of all accessible portions of each vessel. Bilateral testing is considered an integral part of a complete examination. Limited examinations for reoccurring indications may be performed as noted.  Right Carotid Findings: +----------+-------+--------+--------+--------------------------------+--------+           PSV    EDV cm/sStenosisDescribe                        Comments            cm/s                                                            +----------+-------+--------+--------+--------------------------------+--------+ CCA Prox  152    31                                              tortuous +----------+-------+--------+--------+--------------------------------+--------+ CCA Distal69     24                                                       +----------+-------+--------+--------+--------------------------------+--------+ ICA Prox  70     25      1-39%   diffuse, hyperechoic and                                                  irregular                                +----------+-------+--------+--------+--------------------------------+--------+ ICA Mid   89     38                                                       +----------+-------+--------+--------+--------------------------------+--------+ ICA Distal134    42                                                       +----------+-------+--------+--------+--------------------------------+--------+  ECA       68     15                                                       +----------+-------+--------+--------+--------------------------------+--------+ +----------+--------+-------+--------+-------------------+           PSV cm/sEDV cmsDescribeArm Pressure (mmHG) +----------+--------+-------+--------+-------------------+ [id number]                                         +----------+--------+-------+--------+-------------------+ +---------+--------+---+--------+--+---------+ VertebralPSV cm/s101EDV cm/s38Antegrade +---------+--------+---+--------+--+---------+  Left Carotid Findings: +----------+--------+--------+--------+------------------+--------+           PSV cm/sEDV cm/sStenosisDescribe          Comments +----------+--------+--------+--------+------------------+--------+ CCA Prox  101     32                                          +----------+--------+--------+--------+------------------+--------+ CCA Distal93      20                                         +----------+--------+--------+--------+------------------+--------+ ICA Prox  84      24      1-39%   focal and calcific         +----------+--------+--------+--------+------------------+--------+ ICA Mid   102     37                                         +----------+--------+--------+--------+------------------+--------+ ICA Distal125     52                                         +----------+--------+--------+--------+------------------+--------+ ECA       93                                                 +----------+--------+--------+--------+------------------+--------+ +----------+--------+--------+--------+-------------------+ SubclavianPSV cm/sEDV cm/sDescribeArm Pressure (mmHG) +----------+--------+--------+--------+-------------------+           102                                         +----------+--------+--------+--------+-------------------+ +---------+--------+---+--------+--+---------+ VertebralPSV cm/s100EDV cm/s37Antegrade +---------+--------+---+--------+--+---------+ IJV extendedly enlarged.  Summary: Right Carotid: Velocities in the right ICA are consistent with a 1-39% stenosis. Left Carotid: Velocities in the left ICA are consistent with a 1-39% stenosis.               Left IJV extendedly enlarged. Vertebrals: Bilateral vertebral arteries demonstrate antegrade flow. *See table(s) above for measurements and observations.  Electronically signed by Curt Jews MD on 06/01/2018 at 5:41:51 PM.    Final    Vas [place] Lower Extremity Venous (dvt)  Result Date: 06/01/2018  Lower Venous Study Indications: Embolic stroke.  Limitations: Body habitus, line and Immobility. Comparison Study: No prior study available Performing Technologist: Rudell Cobb  Examination Guidelines: A complete evaluation includes B-mode imaging,  spectral Doppler, color Doppler, and power Doppler as needed of all accessible portions of each vessel. Bilateral testing is considered an integral part of a complete examination. Limited examinations for reoccurring indications may be performed as noted.  Right Venous Findings: +---------+---------------+---------+-----------+----------+-------------------+          CompressibilityPhasicitySpontaneityPropertiesSummary             +---------+---------------+---------+-----------+----------+-------------------+ CFV      Full           Yes      Yes                                      +---------+---------------+---------+-----------+----------+-------------------+ SFJ      Full                                                             +---------+---------------+---------+-----------+----------+-------------------+ FV Prox  Full                                                             +---------+---------------+---------+-----------+----------+-------------------+ FV Mid   Full                                                             +---------+---------------+---------+-----------+----------+-------------------+ FV Distal                        Yes                  Unable to visualiz                                                        with compression    +---------+---------------+---------+-----------+----------+-------------------+ PFV      Full                                                             +---------+---------------+---------+-----------+----------+-------------------+ POP      Full           Yes      Yes                                      +---------+---------------+---------+-----------+----------+-------------------+  PTV      Full                                                             +---------+---------------+---------+-----------+----------+-------------------+ PERO     Full                                                              +---------+---------------+---------+-----------+----------+-------------------+  Left Venous Findings: +---------+---------------+---------+-----------+----------+-------------------+          CompressibilityPhasicitySpontaneityPropertiesSummary             +---------+---------------+---------+-----------+----------+-------------------+ CFV      Full           Yes      Yes                                      +---------+---------------+---------+-----------+----------+-------------------+ SFJ      Full                                                             +---------+---------------+---------+-----------+----------+-------------------+ FV Prox  Full                                                             +---------+---------------+---------+-----------+----------+-------------------+ FV Mid   Full                                                             +---------+---------------+---------+-----------+----------+-------------------+ FV DistalFull                                         Unable to visualiz                                                        with compression    +---------+---------------+---------+-----------+----------+-------------------+ PFV      Full                                                             +---------+---------------+---------+-----------+----------+-------------------+ POP  Full           Yes      Yes                                      +---------+---------------+---------+-----------+----------+-------------------+ PTV      Full                                                             +---------+---------------+---------+-----------+----------+-------------------+ PERO     Full                                                             +---------+---------------+---------+-----------+----------+-------------------+ Prominent lymph node see at  groin area.    Summary: Right: There is no evidence of deep vein thrombosis in the lower extremity. However, portions of this examination were limited- see technologist comments above. No cystic structure found in the popliteal fossa. Left: There is no evidence of deep vein thrombosis in the lower extremity. However, portions of this examination were limited- see technologist comments above. No cystic structure found in the popliteal fossa.  *See table(s) above for measurements and observations. Electronically signed by Curt Jews MD on 06/01/2018 at 5:41:34 PM.    Final     Cardiac Studies   Echo 05/31/2018 Study Conclusions - Left ventricle: The cavity size was normal. Systolic function was   mildly to moderately reduced. The estimated ejection fraction was   in the range of 40% to 45%. There is akinesis of the anteroseptal   and apical myocardium. Features are consistent with a   pseudonormal left ventricular filling pattern, with concomitant   abnormal relaxation and increased filling pressure (grade 2   diastolic dysfunction). - Aortic valve: Trileaflet; mildly thickened, mildly calcified   leaflets. - Left atrium: The atrium was mildly dilated. - Right ventricle: The cavity size was moderately dilated. Wall   thickness was normal. - Pulmonary arteries: Systolic pressure was severely increased. PA   peak pressure: 71 mm Hg (S).  Patient Profile     58 y.o. female hx of COPD on home 02, DM-2, CAD with PCI at [facility] 2006 and [facility] in 2008 likely to the LAD, STEMI inf wall with DES to RCA 2015  HTN, HLD, h/o of chronic LLL PE, chromic anemia, and chronic systolic HF who is being followed for the evaluation of chronic CHF at the request of Dr. Tyrell Antonio.  Assessment & Plan   Altered mental status, acute hypercarbic respiratory failure: More talkative today -Found to have small embolic stroke on CT, not thought to be sole etiology of her encephalopathy.  -UDS positive for cocaine yesterday  as well.  Acute on chronic systolic heart failure:  -weight inaccurate, none noted since 1/1 (and varied from 184 to 149) -based on I/O, is ~8L negative since admission. Reported 3.7 L out yesterday -sodium and fluid restrict -continue low dose carvedilol -continue BID lasix 80 mg IV, having good response -replete electrolytes PRN -baseline O2 status is 4L [place]  CAD with last stent in  2015: given anemia and need for transfusion, hold aspirin and prasugrel. -given stroke, would restart restart aspirin but not prasugrel when stable -LDL 43  CKD stage 3: Cr today 1.29, improved from admission   Anemia: per primary team. Hgb 7.6 today despite transfusions.  TIME SPENT WITH PATIENT: 15 minutes of direct patient care. More than 50% of that time was spent on coordination of care and counseling regarding heart failure.  Buford Dresser, MD, PhD [facility]   For questions or updates, please contact [place] Please consult [email or web address] for contact info under    Signed, Buford Dresser, MD  06/02/2018, 9:08 AM

## 2018-06-03 LAB — GLUCOSE, CAPILLARY
GLUCOSE-CAPILLARY: 102 mg/dL — AB (ref 70–99)
Glucose-Capillary: 112 mg/dL — ABNORMAL HIGH (ref 70–99)
Glucose-Capillary: 103 mg/dL — ABNORMAL HIGH (ref 70–99)
Glucose-Capillary: 122 mg/dL — ABNORMAL HIGH (ref 70–99)
Glucose-Capillary: 103 mg/dL — ABNORMAL HIGH (ref 70–99)
Glucose-Capillary: 106 mg/dL — ABNORMAL HIGH (ref 70–99)

## 2018-06-03 LAB — CBC
HCT: 30.3 % — ABNORMAL LOW (ref 36.0–46.0)
Hemoglobin: 7.8 g/dL — ABNORMAL LOW (ref 12.0–15.0)
MCH: 21 pg — ABNORMAL LOW (ref 26.0–34.0)
MCHC: 25.7 g/dL — ABNORMAL LOW (ref 30.0–36.0)
MCV: 81.5 fL (ref 80.0–100.0)
Platelets: 447 10*3/uL — ABNORMAL HIGH (ref 150–400)
RBC: 3.72 MIL/uL — ABNORMAL LOW (ref 3.87–5.11)
RDW: 29 % — ABNORMAL HIGH (ref 11.5–15.5)
WBC: 6.8 10*3/uL (ref 4.0–10.5)
nRBC: 2.6 % — ABNORMAL HIGH (ref 0.0–0.2)

## 2018-06-03 LAB — BASIC METABOLIC PANEL
Anion gap: 5 (ref 5–15)
BUN: 21 mg/dL — ABNORMAL HIGH (ref 6–20)
CO2: 41 mmol/L — ABNORMAL HIGH (ref 22–32)
Calcium: 8.3 mg/dL — ABNORMAL LOW (ref 8.9–10.3)
Chloride: 94 mmol/L — ABNORMAL LOW (ref 98–111)
Creatinine, Ser: 1.02 mg/dL — ABNORMAL HIGH (ref 0.44–1.00)
GFR calc Af Amer: >60 mL/min (ref >60)
GFR calc non Af Amer: >60 mL/min (ref >60)
Glucose, Bld: 112 mg/dL — ABNORMAL HIGH (ref 70–99)
Potassium: 4.3 mmol/L (ref 3.5–5.1)
Sodium: 140 mmol/L (ref 135–145)

## 2018-06-03 NOTE — Progress Notes (Signed)
PROGRESS NOTE  Tamara Silva  EUM:[id number] DOB: 07/05/1960 DOA: 05/29/2018 PCP: Antony Blackbird, MD   Brief Narrative: Tamara Silva is a 58 y.o. female with medical history significant for chronic iron deficiency anemia with baseline hemoglobin 8-9, type 2 diabetes mellitus, chronic combined systolic/diastolic heart failure, stage III chronic kidney disease with baseline creatinine of 1.2, was admitted to [facility] on 05/29/2018 with acute on chronic iron deficiency anemia/severe symptomatic anemia, after blood transfusion developed volume overload and respiratory failure, started on diuretics. [facility] course was complicated by encephalopathy and some speech disturbance which prompted an MRI this was positive for stroke, transferred to [facility] for neuro/stroke team eval   Assessment & Plan:   Acute hypoxic respiratory failure -Due to volume overload following blood transfusion; also has history of chronic systolic and diastolic CHF -Required BiPAP earlier this admission, now off, clinically improving with diuresis  -Improving with diuresis, 9.4 L negative  -Continue IV Lasix another day 80 mg every 12, KCl supplementation  -Stabilizing, O2 weaned down to 4 L which is her baseline   Acute on chronic iron deficiency anemia -Longstanding history of same, prior negative gastroenterology work-up -No evidence of acute ongoing GI bleed, last EGD and colonoscopy in 2015 per [facility] unremarkable except for a few diverticuli -Received 3 units of PRBC this admission and given IV iron on 1/1 -Due to acute stroke and pulmonary edema/respiratory failure will hold off on repeat GI evaluation at this time -Hb stable in the last 48 hours  Acute on chronic systolic and diastolic CHF/CAD -Improving with diuresis, continue IV Lasix  80 mg Q12 -Continue carvedilol -Repeat echo, EF of 40 to 45% and some wall motion abnormality  Embolic stroke on CT -Suspect this is an incidental finding, had  trace slurred speech yesterday -Follow-up echocardiogram, carotid duplex with no significant stenosis -MRI brain noted acute 2 cm Infarct in the inferior right parietal lobe, Right MCA/PCA watershed area  -MRA without significant intracranial stenosis -Due to severe iron deficiency anemia not on antiplatelet agents this time, neurology recommends 81 mg aspirin when able to -Neurology following -PT/OT eval completed, SNF recommended -Social worker consulted, patient finally agreeable to consider short-term rehab  History of CAD -Last stent in 2015, cards following  AKI; cr baseline 1.2.  -Admission creatinine was 1.7, now improving to 1.39 -Monitor with diuresis  DM;  -Restarted Levemir, continue sliding scale -CBGs are stable  DVT prophylaxis: scd Code Status: full code.  Family Communication: friend at bedside.  Disposition Plan: Change to Tele, needs SNF at discharge when stable  Consultants:   Cardiology  Neurology   Procedures: none  Antimicrobials: zosyn   Subjective: -Feels a little better today, breathing appears to be finally improving, less drowsy and tired as well  Objective: Vitals:   06/02/18 2100 06/02/18 2343 06/03/18 0751 06/03/18 1159  BP:  (!) 142/76 138/78 132/81  Pulse: 77 78 74 74  Resp: (!) 25 (!) 30 20 20   Temp:  99.2 F (37.3 C) 98.3 F (36.8 C) 97.8 F (36.6 C)  TempSrc:  Axillary Oral Oral  SpO2: 100% 100% 100% 100%  Weight:      Height:        Intake/Output Summary (Last 24 hours) at 06/03/2018 1234 Last data filed at 06/03/2018 1042 Gross per 24 hour  Intake 120 ml  Output 1250 ml  Net -1130 ml   Filed Weights   05/30/18 0107 05/31/18 0500 05/31/18 2050  Weight: (!) 180.5 kg (!) 149.7 kg [name] Kitchen)  149.7 kg    Examination:  Gen: Awake, Alert, Oriented X 3, chronically ill obese female, laying in bed, no distress HEENT: PERRLA, Neck supple, no JVD Lungs: Poor air movement, rare basilar crackles CVS: RRR,No Gallops,Rubs or new  Murmurs Abd: BS soft, nontender, large ventral hernia, bowel sounds present  Extremities: Chronic lymphedema type changes, hyperpigmentation Skin: no new rashes Neuro: Moves all extremities, no localizing signs    Data Reviewed: I have personally reviewed following labs and imaging studies  CBC: Recent Labs  Lab 05/29/18 1052  05/30/18 0754 05/31/18 0337 06/01/18 1024 06/02/18 0518 06/03/18 0653  WBC 8.3  --  12.3* 11.7* 7.5 7.2 6.8  NEUTROABS 5.5  --   --   --   --   --   --   HGB 5.7*   < > 7.4* 7.3* 7.4* 7.6* 7.8*  HCT 24.2*  --  29.3* 28.7* 28.3* 30.0* 30.3*  MCV 74.5*  --  78.1* 76.7* 76.9* 79.2* 81.5  PLT 495*  --  484* 525* 507* 470* 447*   < > = values in this interval not displayed.   Basic Metabolic Panel: Recent Labs  Lab 05/30/18 0754 05/31/18 0337 06/01/18 1024 06/02/18 0518 06/03/18 0653  NA 139 141 139 138 140  K 4.7 4.3 3.9 4.2 4.3  CL 98 96* 92* 92* 94*  CO2 33* 35* 38* 36* 41*  GLUCOSE 137* 89 131* 144* 112*  BUN 41* 39* 30* 27* 21*  CREATININE 1.66* 1.70* 1.39* 1.29* 1.02*  CALCIUM 8.2* 8.2* 8.3* 8.2* 8.3*  MG 2.3  --   --   --   --    GFR: Estimated Creatinine Clearance: 92.6 mL/min (A) (by C-G formula based on SCr of 1.02 mg/dL (H)). Liver Function Tests: Recent Labs  Lab 05/29/18 1052  AST 21  ALT 13  ALKPHOS 102  BILITOT 0.7  PROT 8.3*  ALBUMIN 2.8*   No results for input(s): LIPASE, AMYLASE in the last 168 hours. Recent Labs  Lab 06/01/18 0323  AMMONIA 94*   Coagulation Profile: Recent Labs  Lab 05/30/18 0126  INR 1.41   Cardiac Enzymes: Recent Labs  Lab 05/29/18 1052 05/30/18 0126 05/30/18 0754 05/30/18 1300  TROPONINI <0.03 <0.03 <0.03 <0.03   BNP (last 3 results) No results for input(s): PROBNP in the last 8760 hours. HbA1C: Recent Labs    06/01/18 1024  HGBA1C 7.1*   CBG: Recent Labs  Lab 06/02/18 2357 06/03/18 0443 06/03/18 0628 06/03/18 0805 06/03/18 1201  GLUCAP 131* 112* 102* 103* 122*    Lipid Profile: Recent Labs    06/01/18 1024  CHOL 83  HDL 25*  LDLCALC 43  TRIG 77  CHOLHDL 3.3   Thyroid Function Tests: No results for input(s): TSH, T4TOTAL, FREET4, T3FREE, THYROIDAB in the last 72 hours. Anemia Panel: No results for input(s): VITAMINB12, FOLATE, FERRITIN, TIBC, IRON, RETICCTPCT in the last 72 hours. Sepsis Labs: No results for input(s): PROCALCITON, LATICACIDVEN in the last 168 hours.  Recent Results (from the past 240 hour(s))  MRSA PCR Screening     Status: None   Collection Time: 05/30/18 12:09 AM  Result Value Ref Range Status   MRSA by PCR NEGATIVE NEGATIVE Final    Comment:        The GeneXpert MRSA Assay (FDA approved for NASAL specimens only), is one component of a comprehensive MRSA colonization surveillance program. It is not intended to diagnose MRSA infection nor to guide or monitor treatment for MRSA infections. Performed at  [facility], [place] [address], [place], [place] [address]          Radiology Studies: No results found.      Scheduled Meds: . sodium chloride   Intravenous Once  . carvedilol  3.125 mg Oral BID WC  . furosemide  80 mg Intravenous BID  . insulin detemir  15 Units Subcutaneous Daily  . mouth rinse  15 mL Mouth Rinse BID  . pantoprazole  40 mg Oral Q1200  . potassium chloride  40 mEq Oral Daily   Continuous Infusions: . sodium chloride       LOS: 4 days    Time spent: 25 minutes    [name], MD [facility] Page via [email or web address]  If 7PM-7AM, please contact night-coverage [email or web address] Password [facility] 06/03/2018, 12:34 PM

## 2018-06-03 NOTE — Progress Notes (Signed)
   Dr. Judeth Cornfield note from 06/02/2018 reviewed.  No changes in current management.  Continue with IV Lasix today.  Candee Furbish, MD

## 2018-06-04 ENCOUNTER — Inpatient Hospital Stay (HOSPITAL_COMMUNITY): Payer: Medicaid Other

## 2018-06-04 ENCOUNTER — Inpatient Hospital Stay (HOSPITAL_COMMUNITY): Payer: Medicaid Other | Admitting: Anesthesiology

## 2018-06-04 DIAGNOSIS — G92 Toxic encephalopathy: Secondary | ICD-10-CM

## 2018-06-04 LAB — GLUCOSE, CAPILLARY
Glucose-Capillary: 101 mg/dL — ABNORMAL HIGH (ref 70–99)
Glucose-Capillary: 114 mg/dL — ABNORMAL HIGH (ref 70–99)
Glucose-Capillary: 116 mg/dL — ABNORMAL HIGH (ref 70–99)
Glucose-Capillary: 146 mg/dL — ABNORMAL HIGH (ref 70–99)
Glucose-Capillary: 84 mg/dL (ref 70–99)
Glucose-Capillary: 89 mg/dL (ref 70–99)

## 2018-06-04 LAB — BLOOD GAS, ARTERIAL
Acid-Base Excess: 18 mmol/L — ABNORMAL HIGH (ref 0.0–2.0)
Acid-Base Excess: 18.6 mmol/L — ABNORMAL HIGH (ref 0.0–2.0)
Bicarbonate: 45 mmol/L — ABNORMAL HIGH (ref 20.0–28.0)
Bicarbonate: 44.8 mmol/L — ABNORMAL HIGH (ref 20.0–28.0)
Delivery systems: POSITIVE
Drawn by: 441371
Expiratory PAP: 5
FIO2: 21
FIO2: 28
Inspiratory PAP: 15
O2 Saturation: 98.5 %
O2 Saturation: 87.4 %
PATIENT TEMPERATURE: 98.6
PO2 ART: 118 mmHg — AB (ref 83.0–108.0)
Patient temperature: 98.6
pCO2 arterial: 92.3 mmHg (ref 32.0–48.0)
pCO2 arterial: 76.5 mmHg (ref 32.0–48.0)
pH, Arterial: 7.309 — ABNORMAL LOW (ref 7.350–7.450)
pH, Arterial: 7.385 (ref 7.350–7.450)
pO2, Arterial: 52.5 mmHg — ABNORMAL LOW (ref 83.0–108.0)

## 2018-06-04 LAB — BASIC METABOLIC PANEL
Anion gap: 5 (ref 5–15)
BUN: 21 mg/dL — ABNORMAL HIGH (ref 6–20)
CO2: 42 mmol/L — ABNORMAL HIGH (ref 22–32)
Calcium: 8.4 mg/dL — ABNORMAL LOW (ref 8.9–10.3)
Chloride: 93 mmol/L — ABNORMAL LOW (ref 98–111)
Creatinine, Ser: 1.02 mg/dL — ABNORMAL HIGH (ref 0.44–1.00)
GFR calc Af Amer: >60 mL/min (ref >60)
GFR calc non Af Amer: >60 mL/min (ref >60)
Glucose, Bld: 88 mg/dL (ref 70–99)
Potassium: 4.9 mmol/L (ref 3.5–5.1)
Sodium: 140 mmol/L (ref 135–145)

## 2018-06-04 LAB — CBC
HCT: 32.7 % — ABNORMAL LOW (ref 36.0–46.0)
Hemoglobin: 7.9 g/dL — ABNORMAL LOW (ref 12.0–15.0)
MCH: 19.9 pg — ABNORMAL LOW (ref 26.0–34.0)
MCHC: 24.2 g/dL — ABNORMAL LOW (ref 30.0–36.0)
MCV: 82.6 fL (ref 80.0–100.0)
Platelets: 430 10*3/uL — ABNORMAL HIGH (ref 150–400)
RBC: 3.96 MIL/uL (ref 3.87–5.11)
RDW: 30 % — ABNORMAL HIGH (ref 11.5–15.5)
WBC: 8.1 10*3/uL (ref 4.0–10.5)
nRBC: 1 % — ABNORMAL HIGH (ref 0.0–0.2)

## 2018-06-04 MED ORDER — LORAZEPAM 2 MG/ML IJ SOLN
2.0000 mg | Freq: Once | INTRAMUSCULAR | Status: AC
  Administered 2018-06-04: 2 mg via INTRAMUSCULAR

## 2018-06-04 MED ORDER — LORAZEPAM 2 MG/ML IJ SOLN
INTRAMUSCULAR | Status: AC
  Administered 2018-06-04: 2 mg via INTRAMUSCULAR
  Filled 2018-06-04: qty 1

## 2018-06-04 MED ORDER — LORAZEPAM 1 MG PO TABS: 2.0000 mg | ORAL_TABLET | Freq: Once | ORAL | Status: AC

## 2018-06-04 NOTE — Progress Notes (Signed)
PROGRESS NOTE  Tamara Silva  MVE:[id number] DOB: [birth year] DOA: 05/29/2018 PCP: Antony Blackbird, MD   Brief Narrative: Tamara Silva is a 58 y.o. female with medical history significant for  COPD/chronic respiratory failure on 4 L home O2, chronic iron deficiency anemia with baseline hemoglobin 8-9, type 2 diabetes mellitus, chronic combined systolic/diastolic heart failure, stage III chronic kidney disease with baseline creatinine of 1.2, was admitted to [facility] on 05/29/2018 with acute on chronic iron deficiency anemia/severe symptomatic anemia, after blood transfusion developed volume overload and respiratory failure, started on diuretics. [facility] course was complicated by encephalopathy and some speech disturbance which prompted an MRI this was positive for stroke, transferred to [facility] for neuro/stroke team eval. -1/5 a.m. was noted to be more confused and dysarthric per RN, she activated code stroke, found to be hypercarbic, restarted on BiPAP  Assessment & Plan:   Acute hypoxic respiratory failure -Due to volume overload following blood transfusion; also has history of chronic systolic and diastolic CHF -Required BiPAP earlier this admission, was clinically improving with diuresis  -Improving with diuresis, 9.4 L negative  -Continue IV Lasix another day 80 mg every 12, KCl supplementation  -She was stabilizing and had been weaned down to 4 L O2 which was her baseline -Unfortunately did not use her CPAP for the last 2 nights, this morning was found to be slurring her speech and more drowsy, secondary to encephalopathy from hypercarbia, BG with acute on chronic respiratory acidosis, BiPAP ordered  Acute on chronic iron deficiency anemia -Longstanding history of same, prior negative gastroenterology work-up -No evidence of acute ongoing GI bleed, last EGD and colonoscopy in 2015 per [facility] unremarkable except for a few diverticuli -Received 3 units of PRBC this admission and  given IV iron on 1/1 -Due to acute stroke and pulmonary edema/respiratory failure will hold off on repeat GI evaluation at this time, needs close gastroenterology follow-up -Hb stable in the last 48 hours  Acute on chronic systolic and diastolic CHF/CAD -Improving with diuresis, continue IV Lasix  80 mg Q12 -Continue carvedilol, she is -9.4 L -Repeat echo, EF of 40 to 45% and some wall motion abnormality  Embolic stroke on CT -Suspect this is an incidental finding, had trace slurred speech in [place] [facility] 3 days ago -MRI brain noted acute 2 cm Infarct in the inferior right parietal lobe, Right MCA/PCA watershed area  -MRA without significant intracranial stenosis -Follow-up echocardiogram, carotid duplex with no significant stenosis -Due to severe iron deficiency anemia not on antiplatelet agents at this time, neurology recommends 81 mg aspirin when able to do so safely -Neurology following -PT/OT eval completed, SNF recommended -Social worker consulted, patient finally agreeable to consider short-term rehab  History of CAD -h/o PCI/ stent in 2015, cards following  AKI; cr baseline 1.2.  -Admission creatinine was 1.7,  --Now down to 1.0 which is better than her baseline  DM;  -Restarted Levemir, continue sliding scale -CBGs are stable  DVT prophylaxis: scd Code Status: full code.  Family Communication: friend at bedside.  Disposition Plan: Change status to stepdown today, restarted BiPAP, SNF pending improvement in respiratory status likely at least 48 to 72 hours  Consultants:   Cardiology  Neurology   Procedures: none  Antimicrobials: zosyn   Subjective: -Found by staff and RN this morning to be slurring her words, more drowsy, she activated code stroke -At the time of my assessment patient is awake alert oriented to self, place only partly to time, follows commands, moves all  extremities  Objective: Vitals:   06/04/18 0600 06/04/18 0751 06/04/18 1000 06/04/18  1122  BP:  127/70  119/61  Pulse: 78 80 78   Resp: (!) 27 20 (!) 31 20  Temp:  99 F (37.2 C)  97.6 F (36.4 C)  TempSrc:  Oral  Oral  SpO2: 100% 100% 94%   Weight:      Height:        Intake/Output Summary (Last 24 hours) at 06/04/2018 1302 Last data filed at 06/04/2018 0759 Gross per 24 hour  Intake -  Output 700 ml  Net -700 ml   Filed Weights   05/30/18 0107 05/31/18 0500 05/31/18 2050  Weight: (!) 180.5 kg (!) 149.7 kg (!) 149.7 kg    Examination:  Gen: Morbidly obese chronically ill female, laying in bed awake, Alert, Oriented X 2 HEENT: PERRLA, Neck supple, no JVD Lungs: Distant breath sounds, improving air movement, decreased at both bases CVS: RRR,No Gallops,Rubs or new Murmurs Abd: soft, Non tender, non distended, BS present Extremities: Trace edema, chronic lymphedema type changes and hyperpigmentation skin: As above Neuro: Follows commands, moves all extremities, no localizing signs    Data Reviewed: I have personally reviewed following labs and imaging studies  CBC: Recent Labs  Lab 05/29/18 1052  05/31/18 0337 06/01/18 1024 06/02/18 0518 06/03/18 0653 06/04/18 0824  WBC 8.3   < > 11.7* 7.5 7.2 6.8 8.1  NEUTROABS 5.5  --   --   --   --   --   --   HGB 5.7*   < > 7.3* 7.4* 7.6* 7.8* 7.9*  HCT 24.2*   < > 28.7* 28.3* 30.0* 30.3* 32.7*  MCV 74.5*   < > 76.7* 76.9* 79.2* 81.5 82.6  PLT 495*   < > 525* 507* 470* 447* 430*   < > = values in this interval not displayed.   Basic Metabolic Panel: Recent Labs  Lab 05/30/18 0754 05/31/18 0337 06/01/18 1024 06/02/18 0518 06/03/18 0653 06/04/18 0824  NA 139 141 139 138 140 140  K 4.7 4.3 3.9 4.2 4.3 4.9  CL 98 96* 92* 92* 94* 93*  CO2 33* 35* 38* 36* 41* 42*  GLUCOSE 137* 89 131* 144* 112* 88  BUN 41* 39* 30* 27* 21* 21*  CREATININE 1.66* 1.70* 1.39* 1.29* 1.02* 1.02*  CALCIUM 8.2* 8.2* 8.3* 8.2* 8.3* 8.4*  MG 2.3  --   --   --   --   --    GFR: Estimated Creatinine Clearance: 92.6 mL/min (A)  (by C-G formula based on SCr of 1.02 mg/dL (H)). Liver Function Tests: Recent Labs  Lab 05/29/18 1052  AST 21  ALT 13  ALKPHOS 102  BILITOT 0.7  PROT 8.3*  ALBUMIN 2.8*   No results for input(s): LIPASE, AMYLASE in the last 168 hours. Recent Labs  Lab 06/01/18 0323  AMMONIA 94*   Coagulation Profile: Recent Labs  Lab 05/30/18 0126  INR 1.41   Cardiac Enzymes: Recent Labs  Lab 05/29/18 1052 05/30/18 0126 05/30/18 0754 05/30/18 1300  TROPONINI <0.03 <0.03 <0.03 <0.03   BNP (last 3 results) No results for input(s): PROBNP in the last 8760 hours. HbA1C: No results for input(s): HGBA1C in the last 72 hours. CBG: Recent Labs  Lab 06/03/18 1635 06/03/18 2020 06/03/18 2358 06/04/18 0443 06/04/18 1119  GLUCAP 103* 106* 101* 89 116*   Lipid Profile: No results for input(s): CHOL, HDL, LDLCALC, TRIG, CHOLHDL, LDLDIRECT in the last 72 hours. Thyroid Function Tests:  No results for input(s): TSH, T4TOTAL, FREET4, T3FREE, THYROIDAB in the last 72 hours. Anemia Panel: No results for input(s): VITAMINB12, FOLATE, FERRITIN, TIBC, IRON, RETICCTPCT in the last 72 hours. Sepsis Labs: No results for input(s): PROCALCITON, LATICACIDVEN in the last 168 hours.  Recent Results (from the past 240 hour(s))  MRSA PCR Screening     Status: None   Collection Time: 05/30/18 12:09 AM  Result Value Ref Range Status   MRSA by PCR NEGATIVE NEGATIVE Final    Comment:        The GeneXpert MRSA Assay (FDA approved for NASAL specimens only), is one component of a comprehensive MRSA colonization surveillance program. It is not intended to diagnose MRSA infection nor to guide or monitor treatment for MRSA infections. Performed at [facility], [place] [address] [place], [place] [address]          Radiology Studies: Ct Head Wo Contrast  Result Date: 06/04/2018 CLINICAL DATA:  Followup right MCA territory strokes. Altered mental status. EXAM: CT HEAD WITHOUT  CONTRAST TECHNIQUE: Contiguous axial images were obtained from the base of the skull through the vertex without intravenous contrast. COMPARISON:  Previous CT and MR examinations 05/31/2018 and 06/01/2018 FINDINGS: Brain: The brainstem and cerebellum appear normal. Left hemisphere appears normal. Old lacunar infarction in the right caudate as seen previously. Low-density in the right parieto-occipital cortex and the right parietal cortical and subcortical brain as seen on previous MR consistent with recent infarction. No evidence of extension. No evidence of hemorrhage. No significant mass effect. No extra-axial collection. No hydrocephalus. Vascular: There is atherosclerotic calcification of the major vessels at the base of the brain. Skull: Negative Sinuses/Orbits: Clear sinuses. Orbits negative except for an old medial wall blowout fracture on the left. Other: None IMPRESSION: Low-density in the right parieto-occipital cortex and right parietal cortical and subcortical brain consistent with recent infarction. No evidence of extension, hemorrhage or significant mass effect. Electronically Signed   By: Nelson Chimes M.D.   On: 06/04/2018 11:57        Scheduled Meds: . sodium chloride   Intravenous Once  . carvedilol  3.125 mg Oral BID WC  . furosemide  80 mg Intravenous BID  . insulin detemir  15 Units Subcutaneous Daily  . mouth rinse  15 mL Mouth Rinse BID  . pantoprazole  40 mg Oral Q1200  . potassium chloride  40 mEq Oral Daily   Continuous Infusions: . sodium chloride       LOS: 5 days    Time spent: 35 minutes    [name], MD [facility] Page via [email or web address]  If 7PM-7AM, please contact night-coverage [email or web address] Password [email or web address] 06/04/2018, 1:02 PM

## 2018-06-04 NOTE — Progress Notes (Addendum)
Neurology Progress Note   S:// Called emergently to evaluate the patient for change in mental status concern for stroke. Last evaluation by stroke neurology on 06/01/2017 showed the person to be awake alert slow to respond to questions very dysarthric with no aphasia and intact naming and comprehension-initially unwilling to participate but later engaging in exam and conversation.  No significant cranial nerve deficits.  Bilateral upper and lower extremities were 4 out of 5 at that time with decreased fine motor skills with intact sensation and no ataxia. She was found to have right parietal and patchy right MCA infarcts most likely related to an unidentified cardiac source as she was severely anemic as well-hemoglobin 5.3 requiring transfusion. Upon my arrival today, the patient was able to tell me her name and where she was. She was able to raise all her 4 extremities against gravity without drift.  See detailed exam below  Of note, patient has been prescribed a CPAP but she is refusing to wear it and did not wear it last night.  O:// Current vital signs: BP 127/70 (BP Location: Right Arm)   Pulse 80   Temp 99 F (37.2 C) (Oral)   Resp 20   Ht 5' 7.5" (1.715 m)   Wt (!) 149.7 kg   LMP 07/28/2013 Comment: perimenopausal  SpO2 100%   BMI 50.92 kg/m  Vital signs in last 24 hours: Temp:  [97.8 F (36.6 C)-99 F (37.2 C)] 99 F (37.2 C) (01/05 0751) Pulse Rate:  [72-80] 80 (01/05 0751) Resp:  [16-28] 20 (01/05 0751) BP: (99-132)/(68-85) 127/70 (01/05 0751) SpO2:  [97 %-100 %] 100 % (01/05 0751) General: No apparent distress obese woman sitting in bed comfortably. HEENT: Normocephalic atraumatic CVS: I1-4 regular rhythm Respiratory: Chest clear to auscultation Abdomen: Obese, nontender Neurological exam She is awake alert oriented to time place and person. Her speech is non-dysarthric She is slow to respond to questions No evidence of aphasia Cranial nerves: Pupils equal round  react light, extraocular movements intact, visual fields full, face symmetric, articulate intact, palate midline, tongue midline. Motor exam: Antigravity in all 4 extremities Sensory exam: Intact light touch all over with no extinction Coordination: Intact finger-nose-finger   Medications  Current Facility-Administered Medications:  .  0.9 %  sodium chloride infusion (Manually program via Guardrails IV Fluids), , Intravenous, Once, [name], MD .  0.9 %  sodium chloride infusion, , Intravenous, PRN, Regalado, Belkys A, MD .  acetaminophen (TYLENOL) tablet 650 mg, 650 mg, Oral, Q6H PRN, [name], MD, 650 mg at 06/03/18 1300 .  albuterol (PROVENTIL) (2.5 MG/3ML) 0.083% nebulizer solution 2.5 mg, 2.5 mg, Nebulization, Q2H PRN, Regalado, Belkys A, MD, 2.5 mg at 06/03/18 1400 .  carvedilol (COREG) tablet 3.125 mg, 3.125 mg, Oral, BID WC, Regalado, Belkys A, MD, 3.125 mg at 06/04/18 0924 .  furosemide (LASIX) injection 80 mg, 80 mg, Intravenous, BID, [name], MD, 80 mg at 06/04/18 0816 .  hydrALAZINE (APRESOLINE) injection 5 mg, 5 mg, Intravenous, Q6H PRN, Regalado, Belkys A, MD, 5 mg at 05/31/18 0107 .  insulin detemir (LEVEMIR) injection 15 Units, 15 Units, Subcutaneous, Daily, [name], MD, 15 Units at 06/04/18 0930 .  MEDLINE mouth rinse, 15 mL, Mouth Rinse, BID, Regalado, Belkys A, MD, Stopped at 06/03/18 2353 .  ondansetron (ZOFRAN) tablet 4 mg, 4 mg, Oral, Q6H PRN **OR** ondansetron (ZOFRAN) injection 4 mg, 4 mg, Intravenous, Q6H PRN, Regalado, Belkys A, MD, 4 mg at 05/29/18 1850 .  pantoprazole (PROTONIX) EC tablet  40 mg, 40 mg, Oral, Q1200, [name], MD, 40 mg at 06/04/18 1030 .  potassium chloride SA (K-DUR,KLOR-CON) CR tablet 40 mEq, 40 mEq, Oral, Daily, [name], MD, 40 mEq at 06/04/18 0931 Labs CBC    Component Value Date/Time   WBC 8.1 06/04/2018 0824   RBC 3.96 06/04/2018 0824   HGB 7.9 (L) 06/04/2018 0824   HCT 32.7 (L) 06/04/2018 0824    PLT 430 (H) 06/04/2018 0824   MCV 82.6 06/04/2018 0824   MCH 19.9 (L) 06/04/2018 0824   MCHC 24.2 (L) 06/04/2018 0824   RDW 30.0 (H) 06/04/2018 0824   LYMPHSABS 1.7 05/29/2018 1052   MONOABS 1.0 05/29/2018 1052   EOSABS 0.1 05/29/2018 1052   BASOSABS 0.1 05/29/2018 1052    CMP     Component Value Date/Time   NA 140 06/04/2018 0824   NA 138 01/23/2018 1521   K 4.9 06/04/2018 0824   CL 93 (L) 06/04/2018 0824   CO2 42 (H) 06/04/2018 0824   GLUCOSE 88 06/04/2018 0824   BUN 21 (H) 06/04/2018 0824   BUN 22 01/23/2018 1521   CREATININE 1.02 (H) 06/04/2018 0824   CREATININE 0.89 07/08/2015 1645   CALCIUM 8.4 (L) 06/04/2018 0824   PROT 8.3 (H) 05/29/2018 1052   ALBUMIN 2.8 (L) 05/29/2018 1052   AST 21 05/29/2018 1052   ALT 13 05/29/2018 1052   ALKPHOS 102 05/29/2018 1052   BILITOT 0.7 05/29/2018 1052   GFRNONAA >60 06/04/2018 0824   GFRNONAA 73 07/08/2015 1645   GFRAA >60 06/04/2018 0824   GFRAA 84 07/08/2015 1645   Imaging I have reviewed images in epic and the results pertinent to this consultation are: CT-scan of the brain-no acute changes.  Changes suggestive of the recent infarct  Arterial blood gas suggestive of hypercarbia  Assessment:  58 year old woman admitted 12/30 with generalized weakness in the setting of chronic iron deficiency Mia, hemoglobin 5.3, transfused followed by development of CHF with slurred speech and CT scan showing small cortical right occipital parietal infarct with the MRI confirming the inferior right parietal lobe infarct with patchy subacute right MCA territory infarcts and a old right caudate lacune. Had a change in her mentation today this morning as noted by his RN- my exam remains unchanged from the prior documented neurological exam. Sent for stat CT of the brain-has just been completed-official read pending-no acute changes.  Likely mild encephalopathy from hypoxia/not using her CPAP  Recommendations: If the stat CT report is  negative for any acute change, no further new neurological recommendations Correct toxic metabolic derangements including hypercarbia/hypoxia Full set of recommendations in the neurology progress note from 06/01/2017. Spoke on the phone with Dr. Broadus John, primary hospitalist with the patient and relayed my plan.  She is already working on correcting the toxic metabolic derangements.  Neurology will be available as needed.  -- Amie Portland, MD [facility] Pager: [phone] If 7pm to 7am, please call on call as listed on AMION.

## 2018-06-04 NOTE — Progress Notes (Signed)
RN called for change in mental status, concerned for a stroke. On arrival pt supine in bed, moving all extremeties, alert to person, time, and place. Skin warm and dry. Pt has a Left side facial droop, per RN that is normal for her. NIH 1. Pt refusing to wear CPAP at night for sleep apnea. Received a code blue page. Asked RN to page out code stroke and get an ABG.   Update  Dr. Lorraine Lax examined pt and cancelled code stroke.  ABG 7.30/92.3/118/45

## 2018-06-04 NOTE — Evaluation (Signed)
Speech Language Pathology Evaluation Patient Details Name: Sharni Negron MRN: [id number] DOB: 12-Aug-1960 Today's Date: 06/04/2018 Time: 2376-2831 SLP Time Calculation (min) (ACUTE ONLY): 24 min  Problem List:  Patient Active Problem List   Diagnosis Date Noted  . Anemia 06/03/2018  . Cerebral embolism with cerebral infarction 06/01/2018  . AKI (acute kidney injury) ([place]) 05/30/2018  . Pressure injury of skin 05/30/2018  . Acute on chronic anemia 05/29/2018  . Chronic respiratory failure with hypoxia, on home O2 therapy ([place]) 01/27/2018  . Iron deficiency anemia 01/27/2018  . Tobacco abuse disorder 10/03/2017  . Dyslipidemia 04/29/2016  . Primary insomnia 04/29/2016  . Hypoxemia 09/08/2015  . CHF (congestive heart failure) ([place]) 09/08/2015  . Obesity hypoventilation syndrome ([place])   . Microcytic anemia   . Acute encephalopathy   . Uncontrolled type 2 diabetes mellitus with hyperosmolarity without coma ([place])   . Acute on chronic respiratory failure with hypercapnia ([place])   . CAP (community acquired pneumonia)   . OSA (obstructive sleep apnea)   . Acute on chronic respiratory failure ([place]) 06/22/2015  . Acute on chronic combined systolic (congestive) and diastolic (congestive) heart failure ([place]) 06/21/2015  . Hypoglycemia 06/21/2015  . Uncontrolled type 2 diabetes mellitus without complication, with long-term current use of insulin ([place])   . Dyslipidemia associated with type 2 diabetes mellitus ([place]) 05/21/2015  . Anemia of chronic kidney failure 05/21/2015  . Hyperkalemia 05/21/2015  . COPD GOLD III  06/10/2014  . Acute respiratory failure with hypoxia ([place]) 03/04/2014  . CKD (chronic kidney disease), stage II 02/07/2014  . COPD exacerbation ([place]) 02/01/2014  . CAD S/P percutaneous coronary angioplasty - prior PCI to LAD; RCA PCI: new Xience Alpine DES 2.75 mm x 15 mm  10/07/2013  . Morbid obesity ([place]) 07/30/2013  . Chronic combined systolic and diastolic heart failure ([place])  07/10/2013  . Chronic pulmonary embolism ([place]) 02/08/2013  . Bipolar disorder ([place]) 02/08/2013  . Essential hypertension 02/08/2013  . Chronic anticoagulation 11/27/2012  . Edema extremities 10/15/2012  . COPD (chronic obstructive pulmonary disease) ([place]) 10/15/2012  . Pulmonary embolism on right ([place]) 10/15/2012  . Hypertension 10/15/2012  . Insulin dependent diabetes mellitus ([place]) 10/15/2012   Past Medical History:  Past Medical History:  Diagnosis Date  . Asthma   . Chronic combined systolic (congestive) and diastolic (congestive) heart failure ([place])   . Chronic iron deficiency anemia   . Diabetes mellitus without complication ([place])   . Hypertension    Past Surgical History:  Past Surgical History:  Procedure Laterality Date  . COLONOSCOPY WITH PROPOFOL N/A 08/03/2013   Procedure: COLONOSCOPY WITH PROPOFOL;  Surgeon: Jeryl Columbia, MD;  Location: [facility] ENDOSCOPY;  Service: Endoscopy;  Laterality: N/A;  . CORONARY ANGIOPLASTY WITH STENT PLACEMENT     CAD in 2006 x 2 and 2009 2 more- place din REx in [place] and [place] med  . CORONARY ANGIOPLASTY WITH STENT PLACEMENT  10/07/2013   Xience Alpine DES 2.75  mm x 15  mm  . ESOPHAGOGASTRODUODENOSCOPY (EGD) WITH PROPOFOL N/A 08/03/2013   Procedure: ESOPHAGOGASTRODUODENOSCOPY (EGD) WITH PROPOFOL;  Surgeon: Jeryl Columbia, MD;  Location: [facility] ENDOSCOPY;  Service: Endoscopy;  Laterality: N/A;  . LEFT HEART CATHETERIZATION WITH CORONARY ANGIOGRAM N/A 10/07/2013   Procedure: LEFT HEART CATHETERIZATION WITH CORONARY ANGIOGRAM;  Surgeon: Leonie Man, MD;  Location[facility] C[facility] LAB;  Service: Cardiovascular;  Laterality: N/A;   HPI:  Ashayla Donahoe is a 58 y.o. female with medical history significant for chronic iron deficiency anemia with  baseline hemoglobin 8-9, type 2 diabetes mellitus, chronic combined systolic/diastolic heart failure, stage III chronic kidney disease with baseline creatinine of 1.2, who is admitted for observation to [facility] long  hospital on 05/29/2018 with acute on chronic iron deficiency anemia after presenting from home to [facility] long emergency department complaining of generalized weakness.  The patient reports 1 to 2 days of generalized weakness associated with lightheadedness in the absence of any associated chest pain, shortness of breath, palpitations, presyncope, or syncope.  She also denies any recent nausea, vomiting, abdominal pain, melena, hematochezia, or hematuria.  She acknowledges that she has prescribed oral iron supplementation as an outpatient in the context of a history of chronic iron deficiency anemia, but reports that she has recently been off of this medication as she has run out of refills of such.  Denies any recent trauma.  In the context of a history of coronary artery disease status post PCI with placement of multiple drug-eluting stents, most recently in 2015, the patient reports that she is on chronic dual antiplatelet therapy with aspirin and Effient.  She reports that she is otherwise on no blood thinning agents, and denies any use of NSAIDs.  Patient confirms that most recent colonoscopy occurred in 2015.  MRI is showing 2cm infarct in tghe inferior right parietal lobe, right MCA/PCA watershed area as well as superimposed subacute infarct right posterior MCA.   Code stroke was also called this am due to AMS.  CT head done showing no extension.     Assessment / Plan / Recommendation Clinical Impression  Cognitive/linguistic and motor speech screen were completed.  Of note, a code stroke was called this AM due to acute change in her mental status.  New head CT was completed and was showing no evidence of estension, hemorrhage or significant mass effect.  There is concern that her mental status changes are due to her not wearing her CPAP for the last two nights.  A repeat blood gas is pending.  The patient's speech was clear and easy to understand.  No discernible dysarthria or apraxia was noted.  In  general the patient had issues fousing on the presented tasks and required frequent repetition of the stimulus.  She achieved a score of 18/30 on the Mini Mental State Exam suggesting moderate cognitive deficits.  She was oriented to person, place and situation.  However, she was only partially oriented to time knowing the month and year but not the date or day of the week  She had good immediate recall of 3 novel words, however, given a short delay she was only able to recall 2/3.  Use of a semantic cue did not facilitate recall of the third novel word.  She was unable to complete the attention task.  She struggled to accurately write a short sentence, copy a design and follow all 3 parts of a 3 step command.  She was able to name objects, repeat a short phrase and read/comprehend a short sentence.    She was able to provide logical solutions to simple problems but breakdown occurred given more complex problems.  She struggled to complete a clock drawing task.  Given no cues she was able to produce a small circle with triangles drawn off the circle all the way around, finally placing a small circle inside the larger circle.  She realized that she was missing numbes but was unable to independently write the numbers on the clock face.  Given cues she was able to write  all the numbers but struggled to place them all inside the circle.  Currently she presented with a moderate cognitive deficit and will require ST at this and the next level of care.  She would require24x7  supervision for safety if she were to DC home.      SLP Assessment  SLP Recommendation/Assessment: Patient needs continued Speech Lanaguage Pathology Services SLP Visit Diagnosis: Attention and concentration deficit    Follow Up Recommendations  Skilled Nursing facility;Inpatient Rehab    Frequency and Duration min 2x/week  2 weeks      SLP Evaluation Cognition  Overall Cognitive Status: Impaired/Different from  baseline Arousal/Alertness: Awake/alert Orientation Level: Oriented to person;Oriented to place;Oriented to situation;Disoriented to time Attention: Focused Focused Attention: Impaired Focused Attention Impairment: Verbal basic;Functional basic Memory: Impaired Memory Impairment: Decreased recall of new information Awareness: Appears intact Problem Solving: Impaired Problem Solving Impairment: Verbal basic Executive Function: Organizing;Self Monitoring;Self Correcting Organizing: Impaired Organizing Impairment: Verbal basic Self Monitoring: Impaired Self Monitoring Impairment: Verbal basic Self Correcting: Impaired Self Correcting Impairment: Verbal basic Behaviors: Perseveration;Confabulation Safety/Judgment: Impaired       Comprehension  Auditory Comprehension Overall Auditory Comprehension: Impaired Yes/No Questions: Not tested Commands: Impaired Multistep Basic Commands: 50-74% accurate Reading Comprehension Reading Status: Within funtional limits    Expression Expression Primary Mode of Expression: Verbal Verbal Expression Overall Verbal Expression: Appears within functional limits for tasks assessed Initiation: No impairment Automatic Speech: Name;Social Response Level of Generative/Spontaneous Verbalization: Sentence Repetition: No impairment Naming: No impairment Pragmatics: No impairment Non-Verbal Means of Communication: Not applicable Written Expression Dominant Hand: Right Written Expression: Exceptions to [facility] Self Formulation Ability: Word   Oral / Motor  Motor Speech Overall Motor Speech: Appears within functional limits for tasks assessed Respiration: Within functional limits Phonation: Normal Resonance: Within functional limits Articulation: Within functional limitis Intelligibility: Intelligible Motor Planning: Witnin functional limits Motor Speech Errors: Not applicable   GO                   Shelly Flatten, MA, CCC-SLP Acute Rehab  SLP [phone]  Lamar Sprinkles 06/04/2018, 1:06 PM

## 2018-06-04 NOTE — Progress Notes (Signed)
Called to pt bedside.  Pt pulled apart mask.  Mask reassembled and reapplied.

## 2018-06-04 NOTE — Progress Notes (Signed)
This am pt noted to be lethargic and hypoxic after refusing to wear her CPAP x 2 nights.  After Code Blue/Code Stroke, pt's ABG revealed pt to be hypercapniec.  Pt subsequently placed on BIPAP during the day. Pt found off BIPAP and on [place] at beginning of this Night shift.  Pt placed back on BIPAP per physician's orders.  Will cont to monitor.

## 2018-06-04 NOTE — Progress Notes (Signed)
Lab called ABG, PH 7.309, CO2 92.3, PO2 118, Bicarb 45. MD notified

## 2018-06-04 NOTE — Progress Notes (Signed)
RR trending up. Paged respiratory, who will take a look at patient.

## 2018-06-05 ENCOUNTER — Inpatient Hospital Stay (HOSPITAL_COMMUNITY): Payer: Medicaid Other

## 2018-06-05 DIAGNOSIS — J410 Simple chronic bronchitis: Secondary | ICD-10-CM

## 2018-06-05 DIAGNOSIS — I63411 Cerebral infarction due to embolism of right middle cerebral artery: Principal | ICD-10-CM

## 2018-06-05 DIAGNOSIS — I5042 Chronic combined systolic (congestive) and diastolic (congestive) heart failure: Secondary | ICD-10-CM

## 2018-06-05 DIAGNOSIS — J9620 Acute and chronic respiratory failure, unspecified whether with hypoxia or hypercapnia: Secondary | ICD-10-CM

## 2018-06-05 DIAGNOSIS — G40409 Other generalized epilepsy and epileptic syndromes, not intractable, without status epilepticus: Secondary | ICD-10-CM

## 2018-06-05 DIAGNOSIS — R569 Unspecified convulsions: Secondary | ICD-10-CM

## 2018-06-05 LAB — BLOOD GAS, ARTERIAL
Acid-Base Excess: 10 mmol/L — ABNORMAL HIGH (ref 0.0–2.0)
Acid-Base Excess: 9 mmol/L — ABNORMAL HIGH (ref 0.0–2.0)
Bicarbonate: 37.5 mmol/L — ABNORMAL HIGH (ref 20.0–28.0)
Bicarbonate: 39.2 mmol/L — ABNORMAL HIGH (ref 20.0–28.0)
DRAWN BY: 257701
Drawn by: 54717
FIO2: 100
O2 Saturation: 39.4 %
O2 Saturation: 99.1 %
Patient temperature: 98.6
Patient temperature: 98.6
pCO2 arterial: 90.8 mmHg (ref 32.0–48.0)
pH, Arterial: 7.129 — CL (ref 7.350–7.450)
pH, Arterial: 7.239 — ABNORMAL LOW (ref 7.350–7.450)
pO2, Arterial: 269 mmHg — ABNORMAL HIGH (ref 83.0–108.0)

## 2018-06-05 LAB — CBC
HCT: 34.5 % — ABNORMAL LOW (ref 36.0–46.0)
Hemoglobin: 8.4 g/dL — ABNORMAL LOW (ref 12.0–15.0)
MCH: 20.5 pg — ABNORMAL LOW (ref 26.0–34.0)
MCHC: 24.3 g/dL — ABNORMAL LOW (ref 30.0–36.0)
MCV: 84.1 fL (ref 80.0–100.0)
Platelets: 402 10*3/uL — ABNORMAL HIGH (ref 150–400)
RBC: 4.1 MIL/uL (ref 3.87–5.11)
RDW: 31.5 % — ABNORMAL HIGH (ref 11.5–15.5)
WBC: 9.1 10*3/uL (ref 4.0–10.5)
nRBC: 0.7 % — ABNORMAL HIGH (ref 0.0–0.2)

## 2018-06-05 LAB — POCT I-STAT 3, ART BLOOD GAS (G3+)
Acid-Base Excess: 29 mmol/L — ABNORMAL HIGH (ref 0.0–2.0)
Acid-Base Excess: 21 mmol/L — ABNORMAL HIGH (ref 0.0–2.0)
Acid-Base Excess: 21 mmol/L — ABNORMAL HIGH (ref 0.0–2.0)
Acid-Base Excess: 22 mmol/L — ABNORMAL HIGH (ref 0.0–2.0)
Bicarbonate: 55 mmol/L — ABNORMAL HIGH (ref 20.0–28.0)
Bicarbonate: 47.8 mmol/L — ABNORMAL HIGH (ref 20.0–28.0)
Bicarbonate: 49.9 mmol/L — ABNORMAL HIGH (ref 20.0–28.0)
Bicarbonate: 51.8 mmol/L — ABNORMAL HIGH (ref 20.0–28.0)
O2 Saturation: 100 %
O2 Saturation: 96 %
O2 Saturation: 97 %
O2 Saturation: 97 %
Patient temperature: 98
Patient temperature: 98
Patient temperature: 98
TCO2: >50 mmol/L — ABNORMAL HIGH (ref 22–32)
TCO2: 50 mmol/L — ABNORMAL HIGH (ref 22–32)
TCO2: >50 mmol/L — ABNORMAL HIGH (ref 22–32)
TCO2: >50 mmol/L — ABNORMAL HIGH (ref 22–32)
pCO2 arterial: 64.1 mmHg — ABNORMAL HIGH (ref 32.0–48.0)
pCO2 arterial: 63.2 mmHg — ABNORMAL HIGH (ref 32.0–48.0)
pCO2 arterial: 82.8 mmHg (ref 32.0–48.0)
pCO2 arterial: 87 mmHg (ref 32.0–48.0)
pH, Arterial: 7.542 — ABNORMAL HIGH (ref 7.350–7.450)
pH, Arterial: 7.485 — ABNORMAL HIGH (ref 7.350–7.450)
pH, Arterial: 7.387 (ref 7.350–7.450)
pH, Arterial: 7.381 (ref 7.350–7.450)
pO2, Arterial: 510 mmHg — ABNORMAL HIGH (ref 83.0–108.0)
pO2, Arterial: 80 mmHg — ABNORMAL LOW (ref 83.0–108.0)
pO2, Arterial: 95 mmHg (ref 83.0–108.0)
pO2, Arterial: 101 mmHg (ref 83.0–108.0)

## 2018-06-05 LAB — BASIC METABOLIC PANEL
Anion gap: 11 (ref 5–15)
Anion gap: 6 (ref 5–15)
BUN: 21 mg/dL — ABNORMAL HIGH (ref 6–20)
BUN: 19 mg/dL (ref 6–20)
CALCIUM: 8.6 mg/dL — AB (ref 8.9–10.3)
CO2: 34 mmol/L — ABNORMAL HIGH (ref 22–32)
CO2: 41 mmol/L — ABNORMAL HIGH (ref 22–32)
Calcium: 8.3 mg/dL — ABNORMAL LOW (ref 8.9–10.3)
Chloride: 91 mmol/L — ABNORMAL LOW (ref 98–111)
Chloride: 93 mmol/L — ABNORMAL LOW (ref 98–111)
Creatinine, Ser: 1.1 mg/dL — ABNORMAL HIGH (ref 0.44–1.00)
Creatinine, Ser: 1.04 mg/dL — ABNORMAL HIGH (ref 0.44–1.00)
GFR calc Af Amer: >60 mL/min (ref >60)
GFR calc Af Amer: >60 mL/min (ref >60)
GFR calc non Af Amer: 55 mL/min — ABNORMAL LOW (ref >60)
GFR calc non Af Amer: 59 mL/min — ABNORMAL LOW (ref >60)
Glucose, Bld: 134 mg/dL — ABNORMAL HIGH (ref 70–99)
Glucose, Bld: 86 mg/dL (ref 70–99)
POTASSIUM: 6.2 mmol/L — AB (ref 3.5–5.1)
Potassium: 4.1 mmol/L (ref 3.5–5.1)
Sodium: 136 mmol/L (ref 135–145)
Sodium: 140 mmol/L (ref 135–145)

## 2018-06-05 LAB — TRIGLYCERIDES: Triglycerides: 61 mg/dL (ref <150)

## 2018-06-05 LAB — GLUCOSE, CAPILLARY
Glucose-Capillary: 71 mg/dL (ref 70–99)
Glucose-Capillary: 77 mg/dL (ref 70–99)
Glucose-Capillary: 77 mg/dL (ref 70–99)
Glucose-Capillary: 83 mg/dL (ref 70–99)
Glucose-Capillary: 85 mg/dL (ref 70–99)
Glucose-Capillary: 87 mg/dL (ref 70–99)

## 2018-06-05 LAB — LACTIC ACID, PLASMA: Lactic Acid, Venous: 1.4 mmol/L (ref 0.5–1.9)

## 2018-06-05 LAB — AMMONIA: Ammonia: 138 umol/L — ABNORMAL HIGH (ref 9–35)

## 2018-06-05 LAB — MRSA PCR SCREENING: MRSA by PCR: NEGATIVE

## 2018-06-05 MED ORDER — LEVETIRACETAM IN NACL 500 MG/100ML IV SOLN
500.0000 mg | Freq: Two times a day (BID) | INTRAVENOUS | Status: DC
  Administered 2018-06-05 – 2018-06-06 (×3): 500 mg via INTRAVENOUS
  Filled 2018-06-05 (×3): qty 100

## 2018-06-05 MED ORDER — ORAL CARE MOUTH RINSE
15.0000 mL | OROMUCOSAL | Status: DC
  Administered 2018-06-05 – 2018-06-06 (×10): 15 mL via OROMUCOSAL

## 2018-06-05 MED ORDER — ASPIRIN 81 MG PO CHEW
81.0000 mg | CHEWABLE_TABLET | Freq: Every day | ORAL | Status: DC
  Administered 2018-06-05 – 2018-06-09 (×5): 81 mg via NASOGASTRIC
  Filled 2018-06-05 (×5): qty 1

## 2018-06-05 MED ORDER — MIDAZOLAM HCL 2 MG/2ML IJ SOLN
1.0000 mg | INTRAMUSCULAR | Status: DC | PRN
  Administered 2018-06-05: 1 mg via INTRAVENOUS
  Filled 2018-06-05: qty 2

## 2018-06-05 MED ORDER — LACTULOSE 10 GM/15ML PO SOLN
30.0000 g | Freq: Three times a day (TID) | ORAL | Status: DC
  Administered 2018-06-05 (×2): 30 g
  Filled 2018-06-05 (×2): qty 45

## 2018-06-05 MED ORDER — LEVETIRACETAM IN NACL 1000 MG/100ML IV SOLN
1000.0000 mg | Freq: Once | INTRAVENOUS | Status: AC
  Administered 2018-06-05: 1000 mg via INTRAVENOUS
  Filled 2018-06-05: qty 100

## 2018-06-05 MED ORDER — FENTANYL CITRATE (PF) 100 MCG/2ML IJ SOLN: 100.0000 ug | INTRAMUSCULAR | Status: DC | PRN

## 2018-06-05 MED ORDER — MIDAZOLAM HCL 2 MG/2ML IJ SOLN: 2.0000 mg | INTRAMUSCULAR | Status: DC | PRN

## 2018-06-05 MED ORDER — MIDAZOLAM 50MG/50ML (1MG/ML) PREMIX INFUSION
0.5000 mg/h | INTRAVENOUS | Status: DC
  Administered 2018-06-05: 0.5 mg/h via INTRAVENOUS
  Filled 2018-06-05: qty 50

## 2018-06-05 MED ORDER — LACTULOSE ENEMA
300.0000 mL | Freq: Two times a day (BID) | ORAL | Status: DC
  Administered 2018-06-06 (×2): 300 mL via RECTAL
  Filled 2018-06-05 (×2): qty 300

## 2018-06-05 MED ORDER — GERHARDT'S BUTT CREAM
TOPICAL_CREAM | Freq: Three times a day (TID) | CUTANEOUS | Status: DC
  Administered 2018-06-05 (×2): via TOPICAL
  Administered 2018-06-05: 1 via TOPICAL
  Administered 2018-06-06 – 2018-06-08 (×7): via TOPICAL
  Filled 2018-06-05 (×2): qty 1

## 2018-06-05 MED ORDER — PROPOFOL 1000 MG/100ML IV EMUL
5.0000 ug/kg/min | INTRAVENOUS | Status: DC
  Administered 2018-06-05: 50 ug/kg/min via INTRAVENOUS
  Administered 2018-06-05 (×2): 80 ug/kg/min via INTRAVENOUS
  Administered 2018-06-05: 20 ug/kg/min via INTRAVENOUS
  Filled 2018-06-05 (×2): qty 200

## 2018-06-05 MED ORDER — ALBUTEROL SULFATE (2.5 MG/3ML) 0.083% IN NEBU
2.5000 mg | INHALATION_SOLUTION | RESPIRATORY_TRACT | Status: DC
  Administered 2018-06-05 – 2018-06-07 (×17): 2.5 mg via RESPIRATORY_TRACT
  Filled 2018-06-05 (×18): qty 3

## 2018-06-05 MED ORDER — CHLORHEXIDINE GLUCONATE 0.12% ORAL RINSE (MEDLINE KIT)
15.0000 mL | Freq: Two times a day (BID) | OROMUCOSAL | Status: DC
  Administered 2018-06-05 – 2018-06-08 (×6): 15 mL via OROMUCOSAL

## 2018-06-05 MED ORDER — PROPOFOL 1000 MG/100ML IV EMUL
INTRAVENOUS | Status: AC
  Administered 2018-06-05: 20 ug/kg/min via INTRAVENOUS
  Filled 2018-06-05: qty 100

## 2018-06-05 MED ORDER — MIDAZOLAM 50MG/50ML (1MG/ML) PREMIX INFUSION: 0.5000 mg/h | INTRAVENOUS | Status: DC

## 2018-06-05 MED ORDER — FENTANYL 2500MCG IN NS 250ML (10MCG/ML) PREMIX INFUSION
0.0000 ug/h | INTRAVENOUS | Status: DC
  Administered 2018-06-05: 100 ug/h via INTRAVENOUS
  Filled 2018-06-05: qty 250

## 2018-06-05 NOTE — Consult Note (Signed)
[place] Nurse wound consult note Reason for Consult:Patient with moisture associated skin damage (MASD), specifically intertriginous dermatitis in the bilateral intermammary, bilateral inguinal and subpannicular areas. There is evidence of incontinence associated dermatitis (IAD) with peeling in the medial thighs consistent with fungal overgrowth. She currently has a female external suction device for UI management. Her heels and sacrum are intact but are at risk for skin breakdown; prevent measures will be placed for those areas. Wound type:Mositure (ITD and IAD) Pressure Injury POA: N/A Measurement: 0.4cm x 12cm area of ITD in the area between breasts with partial thickness tissue loss. Open, dry lesions.  Moisture collection in the subpannicular area and bilateral inguinal areas, but no breakdown. Peeling epidermis in the bilateral medial thigh areas consistent with fungal overgrowth in urinary incontinence. Wound bed: As described above Drainage (amount, consistency, odor) None Periwound: intact, dry Dressing procedure/placement/frequency: I will today provide bilateral pressure redistribution heel boots (Prevalon) and a sacral prophylactic dressing as she is at risk for pressure injury.  She is currently on a mattress replacement with low air loss feature and is being turned and repositioned side to side. I have provided guidance for Nursing via the Orders for use of our antimicrobial wicking textile (InterDry Ag+) in the inframammary, bilateral inguinal and subpannicular areas.  We will implement use of Gerhart's Butt cream (a compounded 1:1:1 product with zinc oxide, lotrimin and hydrocortisone cream).  A systemic antifungal would expedite resolution of the fungal overgrowth in the perineal and medial thigh areas, if you agree, please order (e.g. Diflucan).  [place] nursing team will not follow, but will remain available to this patient, the nursing and medical teams.  Please re-consult if  needed. Thanks, Maudie Flakes, MSN, RN, [place], Arther Abbott  Pager# [phone]

## 2018-06-05 NOTE — Progress Notes (Signed)
ETT advance per Dr. Carson Myrtle 2cm to 24cm at the lip without complications

## 2018-06-05 NOTE — Progress Notes (Signed)
[place] Progress Note Patient Name: Tamara Silva DOB: [birth year] MRN: [id number]   Date of Service  06/05/2018  HPI/Events of Note  Patient is on Lactulose per tube, however, patient does not have gastric tube. Last Ammonia level = 130.   eICU Interventions  Will order: 1. D/C Lactulose per tube.   2. Lactulose Enema now and BID. 3. Ammonia level in AM.      Intervention Category Major Interventions: Other:  Lysle Dingwall 06/05/2018, 10:46 PM

## 2018-06-05 NOTE — Transfer of Care (Signed)
Immediate Anesthesia Transfer of Care Note  Patient: Tamara Silva  Procedure(s) Performed: AN AD [place]  Patient Location: Nursing Unit  Anesthesia Type:General  Level of Consciousness: unresponsive  Airway & Oxygen Therapy: Patient remains intubated per anesthesia plan and Patient placed on Ventilator (see vital sign flow sheet for setting)  Post-op Assessment: Report given to RN  Post vital signs: Reviewed and stable  Last Vitals:  Vitals Value Taken Time  BP 117/50 06/05/2018 12:02 AM  Temp    Pulse 78 06/05/2018 12:03 AM  Resp 12 06/05/2018 12:03 AM  SpO2 100 % 06/05/2018 12:03 AM  Vitals shown include unvalidated device data.  Last Pain:  Vitals:   06/04/18 2000  TempSrc: Oral  PainSc:       Patients Stated Pain Goal: 0 (24/11/46 4314)  Complications: No apparent anesthesia complications

## 2018-06-05 NOTE — Progress Notes (Signed)
[place] Progress Note Patient Name: Tamara Silva DOB: 04/27/1961 MRN: [id number]   Date of Service  06/05/2018  HPI/Events of Note  K+ = 6.2. However, specimen noted to be hemolyzed.   eICU Interventions  Will order: 1. D/C daily KCl replacement. 2. BMP STAT.     Intervention Category Major Interventions: Electrolyte abnormality - evaluation and management  Sommer,Steven Eugene 06/05/2018, 5:41 AM

## 2018-06-05 NOTE — Progress Notes (Addendum)
Called to Pt's bedside for desaturations.  Pt on BIPAP 15/5, 40%.  SPO2 85-86%.  FIO2 increased to 100%.  SPO2 increased to mid 90s.  Pt then noted to be unresponsive to voice and stimuli.  Rapid Response called for change in status.  Within minutes pt began to have a seizure which lasted 5-6 minutes.  Pt removed from BIPAP and placed on Nonrebreather mask.  Pt was not noted to be apneic/pulseless.  Code Blue called in order to manage airway.  Pt inbubated by CRNA and subsequently placed on vent for transport.

## 2018-06-05 NOTE — Progress Notes (Signed)
PT Cancellation Note  Patient Details Name: Tamara Silva MRN: [id number] DOB: Jun 11, 1960   Cancelled Treatment:    Reason Eval/Treat Not Completed: Medical issues which prohibited therapy. Pt coded and now intubated.   [place] 06/05/2018, 10:38 AM [place] Pager [phone] Office [phone]

## 2018-06-05 NOTE — Progress Notes (Signed)
   06/05/18 0900  Clinical Encounter Type  Visited With Health care provider;Family  Visit Type Initial;Social support;Spiritual support;Code  Referral From Other (Comment) (code blue)  Spiritual Encounters  Spiritual Needs Emotional  Stress Factors  Family Stress Factors Lack of knowledge;Loss of control   Responded to code blue, met w/ pt's boyfriend in waiting rm, supportive presence for him, also got pt's daughter's name from him, found her # in chart, called her and asked her to come to [facility].  Continued support w/ pt's daughter and boyfriend in [facility] waiting room after pt was transferred to that unit post-CT scan.  Updated RNs about who was present and coming and what they knew.  Myra Gianotti resident, [phone]

## 2018-06-05 NOTE — Progress Notes (Signed)
S: First time seizure occurred at approximately 2330, with GTC activity lasting for approximately 6 minutes, followed by an unresponsive state. Rapid response was called and the patient was intubated and sedated. The patient is being transferred to the [facility] ICU under the CCM team. STAT CT head was ordered by the IM team post-intubation.   O: STAT CT completed and images have been reviewed. I agree with the Radiology findings, as follows: Low-density in the right parieto-occipital cortex and right parietal cortical and subcortical brain consistent with recent infarction. No evidence of extension, hemorrhage or significant mass effect.  BP (!) 107/49   Pulse 86   Temp 98.6 F (37 C) (Oral)   Resp (!) 35   Ht 5' 7.5" (1.715 m)   Wt (!) 149.7 kg   LMP 07/28/2013 Comment: perimenopausal  SpO2 100%   BMI 50.92 kg/m   Neurological exam: Ment: In the context of intubation, the patient is agitated and not following commands. She thrashes arms when restraints are removed and appears prone to attempting to pull ET tube out. She will gaze at examiner and furrow brow.  CN: Fixates on and tracks examiner. Eyes conjugate without forced deviation or nystagmus. Face grossly symmetric.  Motor/Sensory: Moves all 4 extremities equally to stimulation as well as spontaneously. Does not follow commands for detailed strength assessment  Assessment: New onset seizure in a 58 year old female with recent right parieto-occipital ischemic strokes 1. Etiology of the seizure may have been lowered seizure threshold due to hypercarbia in the setting of hypoventilation. Her ABG obtained at 3 PM on Sunday after a spell of AMS revealed an elevated PCO2 of 76.5 and a low PO2 of 52.5.  2. Recent right hemisphere strokes also may be serving as a seizure-onset zone 3. No clinical evidence for seizure activity at time of Neurology assessment  Recommendations: 1. EEG in the AM (ordered) 2. Load with Keppra 1000 mg IV x 1, then  start scheduled dosing at 500 mg IV BID (ordered).  3. Frequent neuro checks.  4. Per [facility] statutes, patients with seizures are not allowed to drive until  they have been seizure-free for six months. Use caution when using heavy equipment or power tools. Avoid working on ladders or at heights. Take showers instead of baths. Ensure the water temperature is not too high on the home water heater. Do not go swimming alone. When caring for infants or small children, sit down when holding, feeding, or changing them to minimize risk of injury to the child in the event you have a seizure. Also, Maintain good sleep hygiene. Avoid alcohol.  35 minutes spent in the neurological evaluation and management of this critically ill patient. Time spent included coordination of care and education of family.   Electronically signed: Dr. Kerney Elbe

## 2018-06-05 NOTE — Progress Notes (Signed)
PCCM Interval Progress Note  Asked to see pt for possible extubation.  Pt somnolent, not responsive.  Per RN, was agitated earlier. Currently on PSV at 18/5 with Vt only 250 - 275.  RN states (and RT note confirms) that pt was on PSV at 5/5 earlier with Vt only around 100.  Pt not ready for extubation. Changed vent back to PRVC. D/c fentanyl gtt, midazolam gtt, propofol gtt. PRN sedation with fentanyl and midazolam incase of discomfort / agitation.  Once awakens more, then try SBT again and assess for extubation readiness.   Montey Hora, [place] [facility] Pager: [phone].  If no answer, [phone] 06/05/2018, 2:02 PM

## 2018-06-05 NOTE — Progress Notes (Signed)
Approximately this time, I called respiratory to come, apply BiPap. Seeing the order from am for continuous BiPap. I asked respiratory Rip Harbour about the patient not wearing her cpap at night, and Rip Harbour told me Patient refuses it. ) Patient was lethargic, but talking. Significant other, in the room at the time, states the cpap at home did not fit her well.. Bipap placed. I asked Rip Harbour to show me what to do should I need to give her medicine, which she did. Safety maintained.

## 2018-06-05 NOTE — Progress Notes (Signed)
Dr. Carson Myrtle verified OG tube placement via abd xray -- Sts it's fine to use.

## 2018-06-05 NOTE — Procedures (Signed)
ELECTROENCEPHALOGRAM REPORT   Patient: Tamara Silva       Room #: [facility] EEG No. ID: [id number] Age: 58 y.o.        Sex: female Referring Physician: Agarwala Report Date:  06/05/2018        Interpreting Physician: Alexis Goodell  History: Inga Noller is an 58 y.o. female with stroke and seizure  Medications:  ASA, Coreg, Lasix, Levemir, Keppra, Protonix, Fentanyl, Versed, Diprovan  Conditions of Recording:  This is a 21 channel routine scalp EEG performed with bipolar and monopolar montages arranged in accordance to the international 10/20 system of electrode placement. One channel was dedicated to EKG recording.  The patient is in the intubated and sedated state.  Description:  The background activity is slow and poorly organized.  The dominant background rhythm is a polymorphic delta activity although some poorly organized theta activity is noted intermixed as well.  This activity is diffusely distributed and continuous throughout the recording.  Spindle-like beta activity is noted that is intermittent and symmetric over both hemispheres.  Some poorly formed vertex central sharp transients are noted as well.   No epileptiform activity is noted.   Hyperventilation and intermittent photic stimulation were not performed.  IMPRESSION: This is a slow electroencephalogram consistent with sleep.  No epileptiform activity noted.     Alexis Goodell, MD Neurology [phone] 06/05/2018, 12:32 PM

## 2018-06-05 NOTE — Progress Notes (Signed)
Initial Nutrition Assessment  DOCUMENTATION CODES:   Morbid obesity  INTERVENTION:   - If pt is to be reintubated, recommend intiation of enteral nutrition  - If pt is to remain extubated, will follow for diet advancement and supplement as appropriate  NUTRITION DIAGNOSIS:   Inadequate oral intake related to inability to eat as evidenced by NPO status.  GOAL:   Patient will meet greater than or equal to 90% of their needs  MONITOR:   Diet advancement, Vent status, Weight trends, I & O's, Labs, Skin  REASON FOR ASSESSMENT:   Ventilator (later extubated, however likely to reintubate)    ASSESSMENT:   58 year old female who presented to the ED on 12/30 with generalized weakness. PMH significant for chronic iron deficiency anemia, T2DM, CHF, CKD stage III. Pt developed acute hypoxic respiratory failure.  12/31 - pulmonary consulted for hypercarbic respiratory failure 1/1 - CT head showing stroke 1/5 - rapid response for change in mental status and seizures, intubated and transferred to ICU  Discussed pt with RN, RT, and MD. Pt extubated earlier this afternoon, not tolerating well. Pt not responding to voice. Per RT, pt likely to be reintubated. Awaiting decision at this time.  Unable to complete NFPE at this time given multiple providers at bedside discussing reintubating pt.  Will continue to follow pt. If pt is reintubated, will leave TF recommendations.  No family present at time of visit. Will attempt to obtain diet and weight history at follow-up. Weight history in chart reveals recent weight gain.  Medications reviewed and include: Lasix, Levemir, lactulose, Protonix  Labs reviewed: hemoglobin 9.4 (L) CBG's: 85, 71, 83, 146, 114, 84 x 24 hours  UOP: 1200 ml x 24 hours  NUTRITION - FOCUSED PHYSICAL EXAM:  Unable to complete. Preparing to reintubate pt.  Diet Order:   Diet Order            Diet NPO time specified  Diet effective now               EDUCATION NEEDS:   Not appropriate for education at this time  Skin:  Skin Assessment: Skin Integrity Issues: Stage I: R elbow  Last BM:  1/3  Height:   Ht Readings from Last 1 Encounters:  05/30/18 5' 7.5" (1.715 m)    Weight:   Wt Readings from Last 1 Encounters:  06/05/18 (!) 148.5 kg    Ideal Body Weight:  62.5 kg  BMI:  Body mass index is 50.52 kg/m.  Estimated Nutritional Needs:   Kcal:  2100-2300 If reintubated: 1375-1563 kcal  Protein:  105-120 grams If reintubated: >/= 156 grams  Fluid:  >/= 2.1 L    Gaynell Face, MS, RD, LDN Inpatient Clinical Dietitian Pager: [phone] Weekend/After Hours: [phone]

## 2018-06-05 NOTE — Progress Notes (Signed)
[facility] Progress Note Patient Name: Tamara Silva DOB: February 26, 1961 MRN: [id number]   Date of Service  06/05/2018  HPI/Events of Note  Ammonia level = 138.  eICU Interventions  Will order: 1. Place OGT. 2. Lactulose 30 gm per tube now and TID.  3. Bilateral Soft Wrist Restraints.      Intervention Category Major Interventions: Other:  Sommer,Steven Cornelia Copa 06/05/2018, 1:31 AM

## 2018-06-05 NOTE — Anesthesia Procedure Notes (Signed)
Procedure Name: Intubation Date/Time: 06/05/2018 12:02 AM Performed by: Clovis Cao, CRNA Pre-anesthesia Checklist: Patient identified, Emergency Drugs available, Suction available, Patient being monitored and Timeout performed Patient Re-evaluated:Patient Re-evaluated prior to induction Oxygen Delivery Method: Ambu bag Preoxygenation: Pre-oxygenation with 100% oxygen Laryngoscope Size: Glidescope Grade View: Grade I Tube type: Subglottic suction tube Tube size: 7.5 mm Number of attempts: 1 Airway Equipment and Method: Stylet and Video-laryngoscopy Placement Confirmation: ETT inserted through vocal cords under direct vision,  CO2 detector and breath sounds checked- equal and bilateral Secured at: 22 cm Tube secured with: Tape Dental Injury: Teeth and Oropharynx as per pre-operative assessment

## 2018-06-05 NOTE — Progress Notes (Signed)
Reason for consult: Seizure , stroke   Subjective: Patient still intubated and on minimal sedation. Not following commands.    ROS:  Unable to obtain due to poor mental status  Examination  Vital signs in last 24 hours: Temp:  [97.6 F (36.4 C)-98.6 F (37 C)] 98 F (36.7 C) (01/06 1609) Pulse Rate:  [64-86] 77 (01/06 1600) Resp:  [13-35] 18 (01/06 1600) BP: (87-152)/(49-86) 139/69 (01/06 1600) SpO2:  [95 %-100 %] 100 % (01/06 1600) FiO2 (%):  [40 %-100 %] 40 % (01/06 1525) Weight:  [148.5 kg] 148.5 kg (01/06 0500)  General: lying in bed, obese  CVS: pulse-normal rate and rhythm RS: breathing comfortably Extremities: normal   Neuro: MS: Arousable, does not  follows commands CN: pupils equal and reactive,  EOMI, face symmetric,  Motor: moves all 4 extremities equally with good strength Coordination: unable to test Gait: not tested  Basic Metabolic Panel: Recent Labs  Lab 05/30/18 0754  06/02/18 0518 06/03/18 0653 06/04/18 0824 06/05/18 0129 06/05/18 0602  NA 139   < > 138 140 140 136 140  K 4.7   < > 4.2 4.3 4.9 6.2* 4.1  CL 98   < > 92* 94* 93* 91* 93*  CO2 33*   < > 36* 41* 42* 34* 41*  GLUCOSE 137*   < > 144* 112* 88 134* 86  BUN 41*   < > 27* 21* 21* 21* 19  CREATININE 1.66*   < > 1.29* 1.02* 1.02* 1.10* 1.04*  CALCIUM 8.2*   < > 8.2* 8.3* 8.4* 8.3* 8.6*  MG 2.3  --   --   --   --   --   --    < > = values in this interval not displayed.    CBC: Recent Labs  Lab 06/01/18 1024 06/02/18 0518 06/03/18 0653 06/04/18 0824 06/05/18 0129  WBC 7.5 7.2 6.8 8.1 9.1  HGB 7.4* 7.6* 7.8* 7.9* 8.4*  HCT 28.3* 30.0* 30.3* 32.7* 34.5*  MCV 76.9* 79.2* 81.5 82.6 84.1  PLT 507* 470* 447* 430* 402*     Coagulation Studies: No results for input(s): LABPROT, INR in the last 72 hours.  Imaging Reviewed:   EEG:  This is a slow electroencephalogram consistent with sleep.  No epileptiform activity noted.     ASSESSMENT AND PLAN  New onset seizure in a 58  year old female with recent right parieto-occipital ischemic strokes.  EEG shows no seizure activity Continue Keppra 500mg  BID Seizure precuations Wean sedation when possible   Karena Addison Jenell Dobransky [facility] Pager Number [phone] For questions after 7pm please refer to AMION to reach the Neurologist on call

## 2018-06-05 NOTE — Progress Notes (Signed)
RT at bedside to collect post intubation ABG. Pt too agitated at this time for blood draw. RT will check back.

## 2018-06-05 NOTE — Significant Event (Addendum)
Rapid Response Event Note  Overview: Neurologic - Decreased LOC  Initial Focused Assessment: Called by RN about patient's having decreased level of consciousness. Per RN, she was unable to wake the patient and patient is on BIPAP. RT and I arrived at the same time. Patient was unresponsive when I arrived, within 20 seconds of me being in the room, patient started seizing, I immediately removed the BIPAP and requested staff to set up suction and bring a NRB. Leftward gaze while seizing, it appeared to be gran mal seizure, that last about 6 minutes. Oxygen was administered via NRB 15L and after 2-3 minutes, patient was still unresponsive after seizure, unable to protect her airway. Breaths were assisted via BVM and a Code Blue was called for respiratory failure. Code Team arrived, CRNA successfully intubated the patient. TRH NP also was at bedside. HR and BP were stable throughout the event.   Interventions: -- STAT ABG -- Code Blue called at 2341 for emergent intubation -- Ativan 2 mg IV given at 2350 -- STAT CT HEAD     Plan of Care: -- Patient was taken to CT STAT and then transferred to 2H03  Event Summary:    at    Call Time 2331 Arrival Time 2332 End Time 0130  Tamara Silva, [place]

## 2018-06-05 NOTE — Progress Notes (Signed)
eLink Physician-Brief Progress Note Patient Name: Tamara Silva DOB: 03-26-1961 MRN: [id number]   Date of Service  06/05/2018  HPI/Events of Note  Agitation - Currently on Propofol IV infusion at ceiling dose.   eICU Interventions  Will order: 1. Versed 1 mg IV now and Q 1 hour PRN agitation, sedation or seizures.      Intervention Category Major Interventions: Delirium, psychosis, severe agitation - evaluation and management  [name] Eugene 06/05/2018, 2:11 AM

## 2018-06-05 NOTE — Progress Notes (Signed)
Got a call from lab stating they rec'd blood work but there is no order for pt that is coming from 3W.   Report rec'd from [place] who was the nurse and was not aware of any blood work that was sent.   Called Rapid response ... was told to put the order under CCM.   Called E link to verify MD.... Was given verbal ok to use Dr. Levada Schilling name for the time being.

## 2018-06-05 NOTE — Progress Notes (Signed)
EEG Completed; Results Pending  

## 2018-06-05 NOTE — Progress Notes (Signed)
Heard Bipap alarm. On arrival, patient had removed bipap. Her sats were at 88%. I placed the the mask back on and the sats increased to 96%, but fell to 91, and it did not seem to be on  Correctly. I asked the patient if it felt comfortable, and she said "no". I called respiratory to come place it.

## 2018-06-05 NOTE — Progress Notes (Signed)
200 cc of IV  Fentanyl and 60 cc Versed IV wasted in steri cycle. Witnessed by Audry Riles, RN.

## 2018-06-05 NOTE — Progress Notes (Signed)
Called to the bedside by RN and rapid response at 2350. Pt had grand mal seizure that lasted approximately 6 minutes and then became unresponsive around 2340. At bedside, pt had been intubated and sedated. CT scan of head ordered stat. CCM and neuro consulted. Pt transferred to ICU for further management.    Lovey Newcomer, NP [facility] [phone]

## 2018-06-05 NOTE — Progress Notes (Signed)
NAMEAnetra Czerwinski, MRN:  [id number], DOB:  [birth year], LOS: 6 ADMISSION DATE:  05/29/2018, CONSULTATION DATE:  06/05/18 REFERRING MD:  [name], MD, CHIEF COMPLAINT:  Respiratory arrest, acute respiratory failure   Brief History   This is a 58 year old female, history of OSA, COPD, chronic hypoxic respiratory failure(4 L at home), iron deficiency anemia who was admitted to hospital on 12/30 for generalized weakness and anemia, slurred speech, and a found to have a right MCA/PCA watershed area stroke and a 2 cm infarct in the inferior right parietal lobe (felt to be incidental), with hypercapnic respiratory failure.  Placed on BiPAP but had been tolerating it well.  Patient became unresponsive last night, had a generalized tonic-clonic seizure, apnea, and pulselessness.  CODE BLUE was called.  Patient was intubated and transferred to ICU.  History of present illness   This 58 y.o. African-American female reformed smoker is seen in consultation at the request of Dr. [name] for recommendations on further evaluation and management of acute respiratory failure and respiratory arrest.  The patient is seen in 07/18/2001, where the patient is transferred after experiencing respiratory arrest on 3 W.  Patient's family is at the bedside and reports that the patient acutely demonstrated altered mental status and went from being fully conversant to unresponsive (around 2340), even to noxious stimuli.  Rapid response team was called at 2350 due to oxygen desaturation despite BiPAP 15/5, FiO2 40%.  Despite increasing FiO2 to 100% (with commensurate improvement in SPO2), the patient was noted to be unresponsive.  The patient reportedly had a 5 to 6-second generalized tonic-clonic seizure and was subsequently noted to be apneic and pulseless.  CODE BLUE was called.   At the time of clinical interview, the patient is awake and anxious despite propofol infusion at 80 mcg/kg/min.  She initially presented  to [facility] on 05/29/2018 with complaints of generalized weakness and was found to be profoundly anemic (hemoglobin 5.7).  According to the patient's family, the patient presented with slurred speech and encephalopathy, prompting an initial stroke evaluation.  The patient was found to have a right MCA/PCA watershed area stroke in addition to a 2 cm infarct in the inferior right parietal lobe (which were felt to be incidental findings), and it was felt that the patient's primary pathologies stemmed from acute hypercapnic respiratory failure and was initiated on BiPAP support.  Review of the documentation suggests that the patient has had less than satisfactory degrees of compliance to BiPAP therapy (has been removing BiPAP mask).  She is a morbidly obese female with known drug of sleep apnea, COPD and chronic hypoxemic respiratory failure (on 4 LPM supplemental oxygen via nasal cannula at home).  She also has a known history of chronic iron deficiency anemia with a baseline hemoglobin of 8-9.  Past Medical History  Asthma, combined CHF, chronic iron deficiency anemia, diabetes mellitus, hypertension  Significant Hospital Events   05/29/2018: Presented to [facility] long ER with complaints of generalized weakness, found to have hemoglobin 5.7.  Acute on chronic iron deficiency anemia.  Stroke evaluation revealed right MCA/PCA watershed and right parietal embolic stroke.  Transferred to [facility]. 06/04/2018: Rapid response for altered mental status.  5 to 6-minute generalized tonic-clonic seizure.  CODE BLUE for respiratory arrest.  Possible postictal state.  Transferred to ICU ([place]).  Consults:  Neurology  Procedures:  06/05/18 ET tube  Significant Diagnostic Tests:    Micro Data:    Antimicrobials:     Interim history/subjective:  No acute events since transfer to ICU.  She has been Keppra loaded and has not had any seizures.  Currently intubated.  Boyfriend at bedside reported no  updates.   Objective   Blood pressure 121/65, pulse 64, temperature 98.6 F (37 C), temperature source Oral, resp. rate 16, height 5' 7.5" (1.715 m), weight (!) 148.5 kg, last menstrual period 07/28/2013, SpO2 100 %.    Vent Mode: PRVC FiO2 (%):  [40 %-100 %] 40 % Set Rate:  [16 bmp] 16 bmp Vt Set:  [500 mL] 500 mL PEEP:  [5 cmH20] 5 cmH20 Plateau Pressure:  [23 cmH20-33 cmH20] 33 cmH20   Intake/Output Summary (Last 24 hours) at 06/05/2018 6578 Last data filed at 06/05/2018 0600 Gross per 24 hour  Intake 493.29 ml  Output 500 ml  Net -6.71 ml   Filed Weights   05/31/18 0500 05/31/18 2050 06/05/18 0500  Weight: (!) 149.7 kg (!) 149.7 kg (!) 148.5 kg    Examination: General: Ill-appearing female, intubated, unresponsive HENT: Atraumatic, normocephalic Lungs: Symmetric coarse breath sounds, no wheezing or rhonchi Cardiovascular: RRR, no murmurs rubs or gallops Abdomen: Mildly distended, positive bowel sounds Extremities: 1+ bilateral lower extremity edema Neuro: Sedated, intubated  Resolved Hospital Problem list   None  Assessment & Plan:  Acute on chronic hypoxic and hypercapnic respiratory failure COPD Respiratory arrest  Plan: Patient is currently intubated, vent settings PEEP of 5, O2 at 40%, RR 16, tidal volume 500.  BG this morning pH is 7.5, CO2 64 and PO2 510.  Decrease respiratory rate to 14.  -Patient is unresponsive today, will need to wean off propofol and fentanyl drip before considering extubation -Wean off fentanyl and propofol drips -SBT daily -Continue albuterol every 4 hours -Adjust vent settings based on ABG results -Repeat ABG  Embolic stroke, right MCA/PCA watershed, right parietal Tonic-clonic seizures:  Stat head CT showed no acute neurologic process.  Status post Keppra load per neurology.  Has not had any seizures since her last one. -Continue Keppra per neurology -EEG per neurology -Frequent neuro checks -Continue ASA and statin  Combined  systolic and diastolic heart failure: -Continue her Lasix 80 mg twice daily, poor output, Cr has been stable -Obtain bladder scan >>~200 -Continue Coreg 3.125 twice daily  Elevated ammonia levels. Possibly related to the seizure activity. Hgb has been stable, no known history of liver disease, will obtain CMP in AM.  -CMP in AM -Lactulose TID  Acute on chronic iron deficiency anemia, thrombocytosis: -Admission was 5.7. S/p 3U pRBC on 12/30 and 1/2. Hemoglobin today was 8.4, close to her baseline.  Platelets were 402. -Transfuse goal >7 -Continue Protonix  Type 2 diabetes: -Frequent CBGs, sliding scale insulin, continue Levemir   Best practice:  Diet: NPO for extubation Pain/Anxiety/Delirium protocol (if indicated): Fentanyl, versed, propofol PRN VAP protocol (if indicated): yes DVT prophylaxis: Heparin  GI prophylaxis: Protonix Glucose control: SSI Mobility: Bedbound Code Status: Full Family Communication: Spoke with boyfriend Disposition: Remain in ICU  Labs   CBC: Recent Labs  Lab 05/29/18 1052  06/01/18 1024 06/02/18 0518 06/03/18 0653 06/04/18 0824 06/05/18 0129  WBC 8.3   < > 7.5 7.2 6.8 8.1 9.1  NEUTROABS 5.5  --   --   --   --   --   --   HGB 5.7*   < > 7.4* 7.6* 7.8* 7.9* 8.4*  HCT 24.2*   < > 28.3* 30.0* 30.3* 32.7* 34.5*  MCV 74.5*   < > 76.9* 79.2* 81.5 82.6  84.1  PLT 495*   < > 507* 470* 447* 430* 402*   < > = values in this interval not displayed.    Basic Metabolic Panel: Recent Labs  Lab 05/30/18 0754  06/01/18 1024 06/02/18 0518 06/03/18 0653 06/04/18 0824 06/05/18 0129  NA 139   < > 139 138 140 140 136  K 4.7   < > 3.9 4.2 4.3 4.9 6.2*  CL 98   < > 92* 92* 94* 93* 91*  CO2 33*   < > 38* 36* 41* 42* 34*  GLUCOSE 137*   < > 131* 144* 112* 88 134*  BUN 41*   < > 30* 27* 21* 21* 21*  CREATININE 1.66*   < > 1.39* 1.29* 1.02* 1.02* 1.10*  CALCIUM 8.2*   < > 8.3* 8.2* 8.3* 8.4* 8.3*  MG 2.3  --   --   --   --   --   --    < > = values in  this interval not displayed.   GFR: Estimated Creatinine Clearance: 85.5 mL/min (A) (by C-G formula based on SCr of 1.1 mg/dL (H)). Recent Labs  Lab 06/02/18 0518 06/03/18 0653 06/04/18 0824 06/05/18 0129 06/05/18 0137  WBC 7.2 6.8 8.1 9.1  --   LATICACIDVEN  --   --   --   --  1.4    Liver Function Tests: Recent Labs  Lab 05/29/18 1052  AST 21  ALT 13  ALKPHOS 102  BILITOT 0.7  PROT 8.3*  ALBUMIN 2.8*   No results for input(s): LIPASE, AMYLASE in the last 168 hours. Recent Labs  Lab 06/01/18 0323 06/05/18 0040  AMMONIA 94* 138*    ABG    Component Value Date/Time   PHART 7.542 (H) 06/05/2018 0637   PCO2ART 64.1 (H) 06/05/2018 0637   PO2ART 510.0 (H) 06/05/2018 0637   HCO3 55.0 (H) 06/05/2018 0637   TCO2 >50 (H) 06/05/2018 0637   O2SAT 100.0 06/05/2018 0637     Coagulation Profile: Recent Labs  Lab 05/30/18 0126  INR 1.41    Cardiac Enzymes: Recent Labs  Lab 05/29/18 1052 05/30/18 0126 05/30/18 0754 05/30/18 1300  TROPONINI <0.03 <0.03 <0.03 <0.03    HbA1C: Hemoglobin A1C  Date/Time Value Ref Range Status  01/23/2018 02:46 PM 8.0 (A) 4.0 - 5.6 % Final  10/06/2016 04:43 PM 10.0  Final   HbA1c, POC (prediabetic range)  Date/Time Value Ref Range Status  01/23/2018 02:46 PM 8 (A) 5.7 - 6.4 % Final   HbA1c, POC (controlled diabetic range)  Date/Time Value Ref Range Status  01/23/2018 02:46 PM 8.0 (A) 0.0 - 7.0 % Final   HbA1c POC (<> result, manual entry)  Date/Time Value Ref Range Status  01/23/2018 02:46 PM 8 4.0 - 5.6 % Final   Hgb A1c MFr Bld  Date/Time Value Ref Range Status  06/01/2018 10:24 AM 7.1 (H) 4.8 - 5.6 % Final    Comment:    (NOTE) Pre diabetes:          5.7%-6.4% Diabetes:              >6.4% Glycemic control for   <7.0% adults with diabetes   05/21/2015 05:26 AM 7.9 (H) 4.8 - 5.6 % Final    Comment:    (NOTE)         Pre-diabetes: 5.7 - 6.4         Diabetes: >6.4         Glycemic control for adults with  diabetes: <7.0     CBG: Recent Labs  Lab 06/04/18 1640 06/04/18 2205 06/04/18 2352 06/05/18 0535 06/05/18 0801  GLUCAP 84 114* 146* 83 71    Review of Systems:   This patient is critically ill and cannot provide additional history nor review of systems due to endotracheally intubated and unable to speak.  Past Medical History  She,  has a past medical history of Asthma, Chronic combined systolic (congestive) and diastolic (congestive) heart failure ([place]), Chronic iron deficiency anemia, Diabetes mellitus without complication ([place]), and Hypertension.   Surgical History    Past Surgical History:  Procedure Laterality Date  . COLONOSCOPY WITH PROPOFOL N/A 08/03/2013   Procedure: COLONOSCOPY WITH PROPOFOL;  Surgeon: Jeryl Columbia, MD;  Location: [facility] ENDOSCOPY;  Service: Endoscopy;  Laterality: N/A;  . CORONARY ANGIOPLASTY WITH STENT PLACEMENT     CAD in 2006 x 2 and 2009 2 more- place din REx in [place] and [place] [facility]  . CORONARY ANGIOPLASTY WITH STENT PLACEMENT  10/07/2013   [facility]ience [place] DES 2.75  mm x 15  mm  . ESOPHAGOGASTRODUODENOSCOPY (EGD) WITH PROPOFOL N/A 08/03/2013   Procedure: ESOPHAGOGASTRODUODENOSCOPY (EGD) WITH PROPOFOL;  Surgeon: Jeryl Columbia, MD;  Location: [facility] ENDOSCOPY;  Service: Endoscopy;  Laterality: N/A;  . LEFT HEART CATHETERIZATION WITH CORONARY ANGIOGRAM N/A 10/07/2013   Procedure: LEFT HEART CATHETERIZATION WITH CORONARY ANGIOGRAM;  Surgeon: Leonie Man, MD;  Location: [facility] CATH LAB;  Service: Cardiovascular;  Laterality: N/A;     Social History   reports that she quit smoking about 4 years ago. Her smoking use included cigarettes. She has a 10.00 pack-year smoking history. She has never used smokeless tobacco. She reports that she does not drink alcohol or use drugs.   Family History   Her family history includes Asthma in her paternal aunt; Breast cancer in her paternal aunt; Colon cancer in her father; Diabetes in her father; Emphysema in her brother; Heart  attack in her father; Hypertension in her mother; Lung disease in her brother; Sarcoidosis in an other family member; Stroke in her mother.   Allergies Allergies  Allergen Reactions  . Metolazone Other (See Comments)    Dizziness and falling  . Plavix [Clopidogrel Bisulfate] Other (See Comments)    High PRU's - non-responder  . Ibuprofen     Patient stated she can not take ibuprofen.   . Nsaids Other (See Comments)    "I do not take NSAIDs d/t interfering with other meds"     Home Medications  Prior to Admission medications   Medication Sig Start Date End Date Taking? Authorizing Provider  ACCU-CHEK AVIVA PLUS test strip USE 1 STRIP TO CHECK GLUCOSE 3 TIMES DAILY 03/30/18  Yes Fulp, Cammie, MD  ACCU-CHEK FASTCLIX LANCETS MISC 1 applicator by Does not apply route 4 (four) times daily -  before meals and at bedtime. 08/07/15  Yes Charlott Rakes, MD  ACCU-CHEK SOFTCLIX LANCETS lancets USE 1 LANCET TO CHECK GLUCOSE 3 TIMES DAILY 01/14/16  Yes Tresa Garter, MD  aspirin 81 MG tablet Take 1 tablet (81 mg total) by mouth daily. 10/09/13  Yes Barrett, Evelene Croon, PA-C  Blood Glucose Monitoring Suppl (ACCU-CHEK AVIVA) device Use as instructed 3 times daily before meals. 08/06/15  Yes Newlin, Charlane Ferretti, MD  carvedilol (COREG) 12.5 MG tablet TAKE 1 TABLET BY MOUTH 2 TIMES A DAY WITH A MEAL **PT NEEDS TO SCHEDULE AN APPT WITH OUR OFFICE AT [phone] FOR FUTURE REFILLS 04/25/18  Yes Larey Dresser, MD  fluticasone (FLONASE) 50 MCG/ACT nasal spray INSTILL 2 SPRAYS IN EACH NOSTRIL TWICE DAILY. 04/08/17  Yes Tresa Garter, MD  furosemide (LASIX) 40 MG tablet Take 1.5 tablets (60 mg total) by mouth 2 (two) times daily. 02/24/18 02/24/19 Yes Fulp, Cammie, MD  Insulin Pen Needle (TRUEPLUS PEN NEEDLES) 32G X 4 MM MISC Use three times daily to inject insulin 02/02/18  Yes Fulp, Cammie, MD  INSULIN SYRINGE .5CC/29G 29G X 1/2" 0.5 ML MISC Use as instructed by physician 01/27/18  Yes Fulp, Cammie, MD    ipratropium-albuterol (DUONEB) 0.5-2.5 (3) MG/3ML SOLN Use nebulizer to inhale contents of one vial every 6 hours as needed for shortness of breath, wheezing 01/27/18  Yes Fulp, Cammie, MD  LEVEMIR 100 UNIT/ML injection INJECT 45 UNITS SUBCUTANEOUSLY EVERY NIGHT AT BEDTIME *MUST HAVE OFFICE VISIT FOR REFILLS* 02/24/18  Yes Fulp, Cammie, MD  losartan (COZAAR) 50 MG tablet Take 1 tablet (50 mg total) by mouth daily. 01/27/18  Yes Fulp, Cammie, MD  nitroGLYCERIN (NITROSTAT) 0.4 MG SL tablet Place 1 tablet (0.4 mg total) under the tongue every 5 (five) minutes as needed for chest pain. 01/27/18  Yes Fulp, Cammie, MD  NOVOLOG FLEXPEN 100 UNIT/ML FlexPen INJECT 15-20 UNITS SUBCUTANEOUSLY 3 TIMES DAILY WITH MEALS. **PATIENT MUST HAVE OFFICE VISIT FOR FUTURE REFILLS** 02/24/18  Yes Fulp, Cammie, MD  omeprazole (PRILOSEC) 40 MG capsule TAKE 1 CAPSULE BY MOUTH TWICE DAILY. 04/08/17  Yes Tresa Garter, MD  polyethylene glycol (MIRALAX / GLYCOLAX) packet Take 17 g by mouth daily. 01/27/18  Yes Fulp, Cammie, MD  prasugrel (EFFIENT) 10 MG TABS tablet TAKE ONE TABLET BY MOUTH DAILY. 05/29/18  Yes Bensimhon, Shaune Pascal, MD  rosuvastatin (CRESTOR) 10 MG tablet Take 1 tablet (10 mg total) by mouth daily. 01/27/18  Yes Fulp, Cammie, MD  spironolactone (ALDACTONE) 25 MG tablet Take 1 tablet (25 mg total) by mouth daily. 01/27/18  Yes Fulp, Cammie, MD  iron polysaccharides (NIFEREX) 150 MG capsule Take 1 capsule (150 mg total) by mouth daily. Patient not taking: Reported on 05/29/2018 01/27/18   Fulp, Cammie, MD  prazosin (MINIPRESS) 5 MG capsule TAKE 1 CAPSULE BY MOUTH EVERY NIGHT AT BEDTIME FOR NIGHTMARES. Patient not taking: Reported on 05/29/2018 03/30/18   Fulp, Ander Gaster, MD  PROAIR HFA 108 (90 Base) MCG/ACT inhaler INHALE 2 PUFFS BY MOUTH 4 TIMES DAILY AS NEEDED Patient not taking: Reported on 05/29/2018 02/24/18   Fulp, Ander Gaster, MD  senna (SENOKOT) 8.6 MG TABS tablet Take 1 tablet (8.6 mg total) by mouth 2 (two) times  daily. Patient not taking: Reported on 05/29/2018 01/18/18   Doreatha Lew, MD     Critical care time: 77       Asencion Noble, M.D. PGY1 Pager [phone] 06/05/2018 9:57 AM

## 2018-06-05 NOTE — Consult Note (Addendum)
Initial Pulmonary/Critical Care Consultation  Patient Name: Tamara Silva MRN: [id number] DOB: Jul 25, 1960    ADMISSION DATE:  05/29/2018 CONSULTATION DATE:  06/05/2018  REFERRING MD:  [name], MD  REASON FOR CONSULTATION: Respiratory arrest, acute respiratory failure   HISTORY OF PRESENT ILLNESS  This 58 y.o. African-American female reformed smoker is seen in consultation at the request of Dr. [name] for recommendations on further evaluation and management of acute respiratory failure and respiratory arrest.  The patient is seen in 07/18/2001, where the patient is transferred after experiencing respiratory arrest on 3 W.  Patient's family is at the bedside and reports that the patient acutely demonstrated altered mental status and went from being fully conversant to unresponsive (around 2340), even to noxious stimuli.  Rapid response team was called at 2350 due to oxygen desaturation despite BiPAP 15/5, FiO2 40%.  Despite increasing FiO2 to 100% (with commensurate improvement in SPO2), the patient was noted to be unresponsive.  The patient reportedly had a 5 to 6-second generalized tonic-clonic seizure and was subsequently noted to be apneic and pulseless.  CODE BLUE was called.   At the time of clinical interview, the patient is awake and anxious despite propofol infusion at 80 mcg/kg/min.  She initially presented to [facility] on 05/29/2018 with complaints of generalized weakness and was found to be profoundly anemic (hemoglobin 5.7).  According to the patient's family, the patient presented with slurred speech and encephalopathy, prompting an initial stroke evaluation.  The patient was found to have a right MCA/PCA watershed area stroke in addition to a 2 cm infarct in the inferior right parietal lobe (which were felt to be incidental findings), and it was felt that the patient's primary pathologies stemmed from acute hypercapnic respiratory failure and was initiated on  BiPAP support.  Review of the documentation suggests that the patient has had less than satisfactory degrees of compliance to BiPAP therapy (has been removing BiPAP mask).  She is a morbidly obese female with known drug of sleep apnea, COPD and chronic hypoxemic respiratory failure (on 4 LPM supplemental oxygen via nasal cannula at home).  She also has a known history of chronic iron deficiency anemia with a baseline hemoglobin of 8-9.  REVIEW OF SYSTEMS This patient is critically ill and cannot provide additional history nor review of systems due to endotracheally intubated and unable to speak.   PAST MEDICAL/SURGICAL/SOCIAL/FAMILY HISTORIES   Past Medical History:  Diagnosis Date  . Asthma   . Chronic combined systolic (congestive) and diastolic (congestive) heart failure ([place])   . Chronic iron deficiency anemia   . Diabetes mellitus without complication ([place])   . Hypertension     Past Surgical History:  Procedure Laterality Date  . COLONOSCOPY WITH PROPOFOL N/A 08/03/2013   Procedure: COLONOSCOPY WITH PROPOFOL;  Surgeon: Jeryl Columbia, MD;  Location: [facility] END[facility];  Service: Endoscopy;  Laterality: N/A;  . CORONARY ANGIOPLASTY WITH STENT PLACEMENT     CAD in 2006 x 2 and 2009 2 more- place din REx in [place] and [place] [facility]  . CORONARY ANGIOPLASTY WITH STENT PLACEMENT  10/07/2013   Xience [place] DES 2.75  mm x 15  mm  . ESOPHAGOGASTRODUODENOSCOPY (EGD) WITH PROPOFOL N/A 08/03/2013   Procedure: ESOPHAGOGASTRODUODENOSCOPY (EGD) WITH PROPOFOL;  Surgeon: Jeryl Columbia, MD;  Location: [facility];  Service: Endoscopy;  Laterality: N/A;  . LEFT HEART CATHETERIZATION WITH CORONARY ANGIOGRAM N/A 10/07/2013   Procedure: LEFT HEART CATHETERIZATION WITH CORONARY ANGIOGRAM;  Surgeon: Leonie Man, MD;  Location: [facility];  Service: Cardiovascular;  Laterality: N/A;    Social History   Tobacco Use  . Smoking status: Former Smoker    Packs/day: 0.50    Years: 20.00    Pack years: 10.00     Types: Cigarettes    Last attempt to quit: 04/30/2014    Years since quitting: 4.1  . Smokeless tobacco: Never Used  . Tobacco comment: smoked off and on x 20 years  Substance Use Topics  . Alcohol use: No    Alcohol/week: 0.0 standard drinks    Family History  Problem Relation Age of Onset  . Stroke Mother   . Hypertension Mother   . Colon cancer Father   . Diabetes Father   . Heart attack Father   . Sarcoidosis Other   . Breast cancer Paternal Aunt   . Emphysema Brother   . Lung disease Brother        Unknown type, 3 brothers, one with liver and lung disease  . Asthma Paternal Aunt      Allergies  Allergen Reactions  . Metolazone Other (See Comments)    Dizziness and falling  . Plavix [Clopidogrel Bisulfate] Other (See Comments)    High PRU's - non-responder  . Ibuprofen     Patient stated she can not take ibuprofen.   . Nsaids Other (See Comments)    "I do not take NSAIDs d/t interfering with other meds"     Prior to Admission medications   Medication Sig Start Date End Date Taking? Authorizing Provider  ACCU-CHEK AVIVA PLUS test strip USE 1 STRIP TO CHECK GLUCOSE 3 TIMES DAILY 03/30/18  Yes Fulp, Cammie, MD  ACCU-CHEK FASTCLIX LANCETS MISC 1 applicator by Does not apply route 4 (four) times daily -  before meals and at bedtime. 08/07/15  Yes Charlott Rakes, MD  ACCU-CHEK SOFTCLIX LANCETS lancets USE 1 LANCET TO CHECK GLUCOSE 3 TIMES DAILY 01/14/16  Yes Tresa Garter, MD  aspirin 81 MG tablet Take 1 tablet (81 mg total) by mouth daily. 10/09/13  Yes Barrett, Evelene Croon, PA-C  Blood Glucose Monitoring Suppl (ACCU-CHEK AVIVA) device Use as instructed 3 times daily before meals. 08/06/15  Yes Newlin, Charlane Ferretti, MD  carvedilol (COREG) 12.5 MG tablet TAKE 1 TABLET BY MOUTH 2 TIMES A DAY WITH A MEAL **PT NEEDS TO SCHEDULE AN APPT WITH OUR OFFICE AT [phone] FOR FUTURE REFILLS 04/25/18  Yes Larey Dresser, MD  fluticasone (FLONASE) 50 MCG/ACT nasal spray INSTILL 2 SPRAYS  IN EACH NOSTRIL TWICE DAILY. 04/08/17  Yes Tresa Garter, MD  furosemide (LASIX) 40 MG tablet Take 1.5 tablets (60 mg total) by mouth 2 (two) times daily. 02/24/18 02/24/19 Yes Fulp, Cammie, MD  Insulin Pen Needle (TRUEPLUS PEN NEEDLES) 32G X 4 MM MISC Use three times daily to inject insulin 02/02/18  Yes Fulp, Cammie, MD  INSULIN SYRINGE .5CC/29G 29G X 1/2" 0.5 ML MISC Use as instructed by physician 01/27/18  Yes Fulp, Cammie, MD  ipratropium-albuterol (DUONEB) 0.5-2.5 (3) MG/3ML SOLN Use nebulizer to inhale contents of one vial every 6 hours as needed for shortness of breath, wheezing 01/27/18  Yes Fulp, Cammie, MD  LEVEMIR 100 UNIT/ML injection INJECT 45 UNITS SUBCUTANEOUSLY EVERY NIGHT AT BEDTIME *MUST HAVE OFFICE VISIT FOR REFILLS* 02/24/18  Yes Fulp, Cammie, MD  losartan (COZAAR) 50 MG tablet Take 1 tablet (50 mg total) by mouth daily. 01/27/18  Yes Fulp, Cammie, MD  nitroGLYCERIN (NITROSTAT) 0.4 MG SL tablet Place 1 tablet (0.4  mg total) under the tongue every 5 (five) minutes as needed for chest pain. 01/27/18  Yes Fulp, Cammie, MD  NOVOLOG FLEXPEN 100 UNIT/ML FlexPen INJECT 15-20 UNITS SUBCUTANEOUSLY 3 TIMES DAILY WITH MEALS. **PATIENT MUST HAVE OFFICE VISIT FOR FUTURE REFILLS** 02/24/18  Yes Fulp, Cammie, MD  omeprazole (PRILOSEC) 40 MG capsule TAKE 1 CAPSULE BY MOUTH TWICE DAILY. 04/08/17  Yes Tresa Garter, MD  polyethylene glycol (MIRALAX / GLYCOLAX) packet Take 17 g by mouth daily. 01/27/18  Yes Fulp, Cammie, MD  prasugrel (EFFIENT) 10 MG TABS tablet TAKE ONE TABLET BY MOUTH DAILY. 05/29/18  Yes Bensimhon, Shaune Pascal, MD  rosuvastatin (CRESTOR) 10 MG tablet Take 1 tablet (10 mg total) by mouth daily. 01/27/18  Yes Fulp, Cammie, MD  spironolactone (ALDACTONE) 25 MG tablet Take 1 tablet (25 mg total) by mouth daily. 01/27/18  Yes Fulp, Cammie, MD  iron polysaccharides (NIFEREX) 150 MG capsule Take 1 capsule (150 mg total) by mouth daily. Patient not taking: Reported on 05/29/2018 01/27/18    Fulp, Cammie, MD  prazosin (MINIPRESS) 5 MG capsule TAKE 1 CAPSULE BY MOUTH EVERY NIGHT AT BEDTIME FOR NIGHTMARES. Patient not taking: Reported on 05/29/2018 03/30/18   Fulp, Ander Gaster, MD  PROAIR HFA 108 (90 Base) MCG/ACT inhaler INHALE 2 PUFFS BY MOUTH 4 TIMES DAILY AS NEEDED Patient not taking: Reported on 05/29/2018 02/24/18   Fulp, Ander Gaster, MD  senna (SENOKOT) 8.6 MG TABS tablet Take 1 tablet (8.6 mg total) by mouth 2 (two) times daily. Patient not taking: Reported on 05/29/2018 01/18/18   Doreatha Lew, MD    Current Facility-Administered Medications  Medication Dose Route Frequency Provider Last Rate Last Dose  . 0.9 %  sodium chloride infusion (Manually program via Guardrails IV Fluids)   Intravenous Once [name], MD      . 0.9 %  sodium chloride infusion   Intravenous PRN Elmarie Shiley, MD   Stopped at 06/05/18 0153  . acetaminophen (TYLENOL) tablet 650 mg  650 mg Oral Q6H PRN [name], MD   650 mg at 06/03/18 1300  . albuterol (PROVENTIL) (2.5 MG/3ML) 0.083% nebulizer solution 2.5 mg  2.5 mg Nebulization Q2H PRN Regalado, Belkys A, MD   2.5 mg at 06/03/18 1400  . carvedilol (COREG) tablet 3.125 mg  3.125 mg Oral BID WC Regalado, Belkys A, MD   3.125 mg at 06/04/18 1757  . furosemide (LASIX) injection 80 mg  80 mg Intravenous BID [name], MD   80 mg at 06/04/18 1757  . hydrALAZINE (APRESOLINE) injection 5 mg  5 mg Intravenous Q6H PRN Regalado, Belkys A, MD   5 mg at 05/31/18 0107  . insulin detemir (LEVEMIR) injection 15 Units  15 Units Subcutaneous Daily [name], MD   15 Units at 06/04/18 0930  . lactulose (CHRONULAC) 10 GM/15ML solution 30 g  30 g Per Tube TID Anders Simmonds, MD      . levETIRAcetam (KEPPRA) IVPB 500 mg/100 mL premix  500 mg Intravenous BID Kerney Elbe, MD      . MEDLINE mouth rinse  15 mL Mouth Rinse BID Regalado, Belkys A, MD   Stopped at 06/03/18 2353  . midazolam (VERSED) injection 1 mg  1 mg Intravenous Q1H PRN Anders Simmonds, MD   1 mg at 06/05/18 0206  . ondansetron (ZOFRAN) tablet 4 mg  4 mg Oral Q6H PRN Regalado, Belkys A, MD       Or  . ondansetron (ZOFRAN) injection 4 mg  4  mg Intravenous Q6H PRN Regalado, Belkys A, MD   4 mg at 05/29/18 1850  . pantoprazole (PROTONIX) EC tablet 40 mg  40 mg Oral Q1200 [name], MD   40 mg at 06/04/18 1030  . potassium chloride SA (K-DUR,KLOR-CON) CR tablet 40 mEq  40 mEq Oral Daily [name], MD   40 mEq at 06/04/18 0931  . propofol (DIPRIVAN) 1000 MG/100ML infusion  5-80 mcg/kg/min Intravenous Titrated Chundi, Vahini, MD 71.9 mL/hr at 06/05/18 0200 80 mcg/kg/min at 06/05/18 0200     VITAL SIGNS: BP 139/79   Pulse 76   Temp 98.6 F (37 C) (Oral)   Resp 18   Ht 5' 7.5" (1.715 m)   Wt (!) 149.7 kg   LMP 07/28/2013 Comment: perimenopausal  SpO2 100%   BMI 50.92 kg/m   HEMODYNAMICS:    VENTILATOR SETTINGS: Vent Mode: PRVC FiO2 (%):  [100 %] 100 % Set Rate:  [16 bmp] 16 bmp Vt Set:  [500 mL] 500 mL PEEP:  [5 cmH20] 5 cmH20 Plateau Pressure:  [23 cmH20] 23 cmH20  INTAKE / OUTPUT: I/O last 3 completed shifts: In: 120 [P.O.:120] Out: 1400 [Urine:1400]  PHYSICAL EXAMINATION: GENERAL: Intubated. alert, anxious or well-developed. No acute distress. HEAD: normocephalic, atraumatic EYE: PERRLA, EOM intact, no scleral icterus, no pallor. NOSE: nares are patent. No polyps. No exudate.  THROAT/ORAL CAVITY: ETT in situ. Mallampati class cannot be assessed at this time. NECK: supple, no thyromegaly, no JVD, no lymphadenopathy. Trachea midline. CHEST/LUNG: symmetric in development and expansion. Good air entry. No crackles. No wheezes. HEART: Regular S1 and S2 without murmur, rub or gallop. ABDOMEN: soft, nontender, nondistended. Normoactive bowel sounds. No rebound. No guarding. No hepatosplenomegaly. EXTREMITIES: Edema: 2+. No cyanosis.  No clubbing. 2+ DP pulses LYMPHATIC: no cervical/axillary/inguinal lymph nodes appreciated MUSCULOSKELETAL:  No point tenderness.  No bulk atrophy. Joints: Normal.  SKIN: Bilateral breast (inferior/intertriginous) lesions. NEUROLOGIC: Doll's eyes intact. Corneal reflex intact. Spontaneous respirations intact. Cranial nerves II-XII are grossly symmetric and physiologic. Babinski absent. No sensory deficit. Motor: 5/5 @ RUE, 5/5 @ LUE, 5/5 @ RLL,  5/5 @ LLL.  DTR: 2+ @ R biceps, 2+ @ L biceps, 2+ @ R patellar,  2+ @ L patellar. No cerebellar signs. Gait was not assessed.   LABS:  ABG Recent Labs  Lab 06/04/18 0848 06/04/18 1500 06/04/18 2340  PHART 7.309* 7.385 7.129*  PCO2ART 92.3* 76.5* CRITICAL RESULT CALLED TO, READ BACK BY AND VERIFIED WITH:  PO2ART 118* 52.5* 269*    Cardiac Enzymes Recent Labs  Lab 05/30/18 0126 05/30/18 0754 05/30/18 1300  TROPONINI <0.03 <0.03 <3.15    BASIC METABOLIC PROFILE Recent Labs  Lab 05/30/18 0754  06/03/18 0653 06/04/18 0824 06/05/18 0129  NA 139   < > 140 140 136  K 4.7   < > 4.3 4.9 6.2*  CL 98   < > 94* 93* 91*  CO2 33*   < > 41* 42* 34*  BUN 41*   < > 21* 21* 21*  CREATININE 1.66*   < > 1.02* 1.02* 1.10*  GLUCOSE 137*   < > 112* 88 134*  CALCIUM 8.2*   < > 8.3* 8.4* 8.3*  MG 2.3  --   --   --   --    < > = values in this interval not displayed.    Glucose Recent Labs  Lab 06/03/18 2358 06/04/18 0443 06/04/18 1119 06/04/18 1640 06/04/18 2205 06/04/18 2352  GLUCAP 101* 89 116* 84 114* 146*  Liver Enzymes Recent Labs  Lab 05/29/18 1052  AST 21  ALT 13  ALKPHOS 102  BILITOT 0.7  ALBUMIN 2.8*    CBC Recent Labs  Lab 06/03/18 0653 06/04/18 0824 06/05/18 0129  WBC 6.8 8.1 9.1  HGB 7.8* 7.9* 8.4*  HCT 30.3* 32.7* 34.5*  PLT 447* 430* 402*    COAGULATION STUDIES Recent Labs  Lab 05/30/18 0126  INR 1.41    SEPSIS MARKERS No results for input(s): LATICACIDVEN, PROCALCITON, O2SATVEN in the last 168 hours.  CULTURES: Results for orders placed or performed during the hospital encounter of 05/29/18  MRSA  PCR Screening     Status: None   Collection Time: 05/30/18 12:09 AM  Result Value Ref Range Status   MRSA by PCR NEGATIVE NEGATIVE Final    Comment:        The GeneXpert MRSA Assay (FDA approved for NASAL specimens only), is one component of a comprehensive MRSA colonization surveillance program. It is not intended to diagnose MRSA infection nor to guide or monitor treatment for MRSA infections. Performed at [facility], [place] [address] [place], [place] [address]      IMAGING: Ct Head Wo Contrast  Result Date: 06/04/2018 CLINICAL DATA:  Followup right MCA territory strokes. Altered mental status. EXAM: CT HEAD WITHOUT CONTRAST TECHNIQUE: Contiguous axial images were obtained from the base of the skull through the vertex without intravenous contrast. COMPARISON:  Previous CT and MR examinations 05/31/2018 and 06/01/2018 FINDINGS: Brain: The brainstem and cerebellum appear normal. Left hemisphere appears normal. Old lacunar infarction in the right caudate as seen previously. Low-density in the right parieto-occipital cortex and the right parietal cortical and subcortical brain as seen on previous MR consistent with recent infarction. No evidence of extension. No evidence of hemorrhage. No significant mass effect. No extra-axial collection. No hydrocephalus. Vascular: There is atherosclerotic calcification of the major vessels at the base of the brain. Skull: Negative Sinuses/Orbits: Clear sinuses. Orbits negative except for an old medial wall blowout fracture on the left. Other: None IMPRESSION: Low-density in the right parieto-occipital cortex and right parietal cortical and subcortical brain consistent with recent infarction. No evidence of extension, hemorrhage or significant mass effect. Electronically Signed   By: Nelson Chimes M.D.   On: 06/04/2018 11:57   Dg Chest Port 1 View  Result Date: 06/05/2018 CLINICAL DATA:  Respiratory failure, post intubation EXAM: PORTABLE  CHEST 1 VIEW COMPARISON:  Portable exam 2359 hours compared to 05/30/2018 FINDINGS: Endotracheal tube tip projects 4.8 cm above carina. Enlargement of cardiac silhouette. Mediastinal contours normal. Improved RIGHT lung infiltrates though mild residual seen at both the RIGHT upper lobe and at the RIGHT base. Scarring at the lateral LEFT hemithorax unchanged. No gross pleural effusion or pneumothorax. IMPRESSION: Enlargement of cardiac silhouette. LEFT lung scarring. Improved RIGHT lung infiltrates. Electronically Signed   By: Lavonia Dana M.D.   On: 06/05/2018 00:10   Ct Head Wo Contrast (charm Study)  Result Date: 06/05/2018 CLINICAL DATA:  Encephalopathy. Recent right MCA infarct. EXAM: CT HEAD WITHOUT CONTRAST TECHNIQUE: Contiguous axial images were obtained from the base of the skull through the vertex without intravenous contrast. COMPARISON:  Most recent head CT yesterday at 1148 hour, brain MRI 06/01/2018 FINDINGS: Brain: Progressive cortical low-density in the right parietooccipital lobe at site of infarct on MRI. Additional developing low-density in the posterior right frontal and right parietal lobes at site of infarcts on MRI. No evidence of hemorrhagic conversion or suspicious mass effect. Remote lacunar infarct  in the right caudate. No hydrocephalus or midline shift. No evidence of acute ischemia, mild motion artifact limitations. Vascular: Atherosclerosis of skullbase vasculature without hyperdense vessel or abnormal calcification. Skull: No fracture or focal lesion. Sinuses/Orbits: No opacification of ethmoid air cells with mucosal thickening may be secondary to intubation. Small left mastoid effusion, unchanged. Other: None. IMPRESSION: 1. Expected evolution of recent parietooccipital and frontoparietal infarcts seen on MRI. No hemorrhagic conversion or significant mass effect. 2. No new abnormality. Electronically Signed   By: Keith Rake M.D.   On: 06/05/2018 01:31    SIGNIFICANT  EVENTS: 05/29/2018: Presented to [facility] long ER with complaints of generalized weakness, found to have hemoglobin 5.7.  Acute on chronic iron deficiency anemia.  Stroke evaluation revealed right MCA/PCA watershed and right parietal embolic stroke.  Transferred to [facility]. 06/04/2018: Rapid response for altered mental status.  5 to 6-minute generalized tonic-clonic seizure.  CODE BLUE for respiratory arrest.  Possible postictal state.  Transferred to ICU ([place]).  LINES/TUBES: ET tube (06/05/2018>>   ASSESSMENT / PLAN: Principal Problem:   Acute on chronic anemia Active Problems:   Acute and chr resp failure, unsp w hypoxia or hypercapnia (HCC)   Generalized tonic-clonic seizure (HCC)   Chronic combined systolic and diastolic heart failure (HCC)   Uncontrolled type 2 diabetes mellitus without complication, with long-term current use of insulin (HCC)   AKI (acute kidney injury) ([place])   Cerebral embolism with cerebral infarction   Essential hypertension   COPD (chronic obstructive pulmonary disease) (HCC)   Pressure injury of skin  NEUROLOGIC  Generalized tonic-clonic seizure  Embolic stroke, right MCA/PCA watershed, right parietal Stat head CT obtained after CODE BLUE shows no new acute neurologic process. I suspect the patient's seizure threshold for multiple reasons: Recent stroke, hypoxemia and hypercapnia. Keppra load per Neurology.  PULMONARY  Respiratory arrest, more likely postictal state  Acute on chronic hypoxemic and hypercapnic respiratory failure  Endotracheally intubated  COPD Versed/fentanyl gtts Advance ETT 2 cm. Albuterol every 4 hours while intubated. Titrate vent settings based on ABG results. Anticipate extubation later today.   CARDIOVASCULAR  Acute on chronic systolic and diastolic heart failure  Coronary artery disease    HEMATOLOGIC  Acute on chronic iron deficiency anemia  Thrombocytosis  DVT prophylaxis: Heparin Monitor Hgb. Thus far, FOBT  negative. Hematology consultation advised.  RENAL  Acute kidney injury   GASTROINTESTINAL  GI prophylaxis: Protonix    ENDOCRINE  Type 2 diabetes mellitus Accu-Cheks every 4 hours and sliding scale insulin. Continue Levemir.  INFECTIOUS  No acute issues  My assessment, plan of care, findings, medications, side effects, etc. were discussed with: nurse, Dr. Kerney Elbe (Neurology) and patient's daughter.  FAMILY  - Updates: patient's family (daughter)  Critical care time: 60 minutes.  The treatment and management of the patient's condition was required based on the threat of imminent deterioration. This time reflects time spent by the physician evaluating, providing care and managing the critically ill patient's care. The time was spent at the immediate bedside (or on the same floor/unit and dedicated to this patient's care). Time involved in separately billable procedures is NOT included int he critical care time indicated above. Family meeting and update time may be included above if and only if the patient is unable/incompetent to participate in clinical interview and/or decision making, and the discussion was necessary to determining treatment decisions.  Renee Pain, MD Board Certified by the ABIM, [place] Pager: [phone]  [phone]  06/05/2018, 3:51 AM

## 2018-06-05 NOTE — Progress Notes (Signed)
Heard Bipap alarm. Went to patient's room, found her not responding normally.Called respiratory. Respiratory came. Patient unresponsive. Rapid response called. At 2335 patient began having a seizure that lasted approximately 5 minutes. During this time, I paged Lovey Newcomer, NP on call to call Me. When she called I asked her to come to room, patient was seizing and unresponsive. Rapid Response nurse called code blue unresponsiveness and airway support. Patient was intubated at bedside. Chaplain spoke to significant other and called daughter. Order given to transfer patient to ICU. I escorted significant other to 2 Heart, and then gave report to Aquilla Solian, RN.

## 2018-06-05 NOTE — Progress Notes (Signed)
Progress Note  Patient Name: Tamara Silva Date of Encounter: 06/05/2018  Primary Cardiologist: Loralie Champagne, MD   Subjective   Intubated and sedated  Inpatient Medications    Scheduled Meds: . sodium chloride   Intravenous Once  . albuterol  2.5 mg Nebulization Q4H  . carvedilol  3.125 mg Oral BID WC  . chlorhexidine gluconate (MEDLINE KIT)  15 mL Mouth Rinse BID  . furosemide  80 mg Intravenous BID  . insulin detemir  15 Units Subcutaneous Daily  . lactulose  30 g Per Tube TID  . mouth rinse  15 mL Mouth Rinse 10 times per day  . pantoprazole  40 mg Oral Q1200   Continuous Infusions: . sodium chloride 10 mL/hr at 06/05/18 0600  . fentaNYL infusion INTRAVENOUS 150 mcg/hr (06/05/18 0600)  . levETIRAcetam    . midazolam    . propofol (DIPRIVAN) infusion 50 mcg/kg/min (06/05/18 0618)   PRN Meds: sodium chloride, acetaminophen, albuterol, hydrALAZINE, midazolam, ondansetron **OR** ondansetron (ZOFRAN) IV   Vital Signs    Vitals:   06/05/18 0500 06/05/18 0530 06/05/18 0600 06/05/18 0630  BP: (!) 152/72 131/63 (!) 120/57 140/72  Pulse:      Resp: _0 Temp:      TempSrc:      SpO2: 100% 100% 100% 100%  Weight: (!) 148.5 kg     Height:        Intake/Output Summary (Last 24 hours) at 06/05/2018 0729 Last data filed at 06/05/2018 0600 Gross per 24 hour  Intake 493.29 ml  Output 1200 ml  Net -706.71 ml   Filed Weights   05/31/18 0500 05/31/18 2050 06/05/18 0500  Weight: (!) 149.7 kg (!) 149.7 kg (!) 148.5 kg    Telemetry    Sinus - Personally Reviewed  Physical Exam   GEN: Intubated and sedated; morbidly obese Neck: JVP difficult to assess Cardiac: RRR Respiratory: CTA anteriorly GI: Soft, obese MS: 1 + edema Neuro:  Intubated and sedated  Labs    Chemistry Recent Labs  Lab 05/29/18 1052  06/03/18 0653 06/04/18 0824 06/05/18 0129  NA 137   < > 140 140 136  K 4.7   < > 4.3 4.9 6.2*  CL 96*   < > 94* 93* 91*  CO2 35*   < > 41* 42* 34*   GLUCOSE 231*   < > 112* 88 134*  BUN 40*   < > 21* 21* 21*  CREATININE 1.78*   < > 1.02* 1.02* 1.10*  CALCIUM 8.3*   < > 8.3* 8.4* 8.3*  PROT 8.3*  --   --   --   --   ALBUMIN 2.8*  --   --   --   --   AST 21  --   --   --   --   ALT 13  --   --   --   --   ALKPHOS 102  --   --   --   --   BILITOT 0.7  --   --   --   --   GFRNONAA 31*   < > >60 >60 55*  GFRAA 36*   < > >60 >60 >60  ANIONGAP 6   < > _1 < > = values in this interval not displayed.     Hematology Recent Labs  Lab 06/03/18 0653 06/04/18 0824 06/05/18 0129  WBC 6.8 8.1 9.1  RBC 3.72* 3.96 4.10  HGB  7.8* 7.9* 8.4*  HCT 30.3* 32.7* 34.5*  MCV 81.5 82.6 84.1  MCH 21.0* 19.9* 20.5*  MCHC 25.7* 24.2* 24.3*  RDW 29.0* 30.0* 31.5*  PLT 447* 430* 402*    Cardiac Enzymes Recent Labs  Lab 05/29/18 1052 05/30/18 0126 05/30/18 0754 05/30/18 1300  TROPONINI <0.03 <0.03 <0.03 <0.03     BNP Recent Labs  Lab 05/29/18 1052  BNP 1,218.8*     Radiology    Ct Head Wo Contrast  Result Date: 06/04/2018 CLINICAL DATA:  Followup right MCA territory strokes. Altered mental status. EXAM: CT HEAD WITHOUT CONTRAST TECHNIQUE: Contiguous axial images were obtained from the base of the skull through the vertex without intravenous contrast. COMPARISON:  Previous CT and MR examinations 05/31/2018 and 06/01/2018 FINDINGS: Brain: The brainstem and cerebellum appear normal. Left hemisphere appears normal. Old lacunar infarction in the right caudate as seen previously. Low-density in the right parieto-occipital cortex and the right parietal cortical and subcortical brain as seen on previous MR consistent with recent infarction. No evidence of extension. No evidence of hemorrhage. No significant mass effect. No extra-axial collection. No hydrocephalus. Vascular: There is atherosclerotic calcification of the major vessels at the base of the brain. Skull: Negative Sinuses/Orbits: Clear sinuses. Orbits negative except for an old  medial wall blowout fracture on the left. Other: None IMPRESSION: Low-density in the right parieto-occipital cortex and right parietal cortical and subcortical brain consistent with recent infarction. No evidence of extension, hemorrhage or significant mass effect. Electronically Signed   By: Nelson Chimes M.D.   On: 06/04/2018 11:57   Dg Chest Port 1 View  Result Date: 06/05/2018 CLINICAL DATA:  Respiratory failure, post intubation EXAM: PORTABLE CHEST 1 VIEW COMPARISON:  Portable exam 2359 hours compared to 05/30/2018 FINDINGS: Endotracheal tube tip projects 4.8 cm above carina. Enlargement of cardiac silhouette. Mediastinal contours normal. Improved RIGHT lung infiltrates though mild residual seen at both the RIGHT upper lobe and at the RIGHT base. Scarring at the lateral LEFT hemithorax unchanged. No gross pleural effusion or pneumothorax. IMPRESSION: Enlargement of cardiac silhouette. LEFT lung scarring. Improved RIGHT lung infiltrates. Electronically Signed   By: Lavonia Dana M.D.   On: 06/05/2018 00:10   Dg Abd Portable 1v  Result Date: 06/05/2018 CLINICAL DATA:  OG tube placement. EXAM: PORTABLE ABDOMEN - 1 VIEW COMPARISON:  CT 05/29/2018 FINDINGS: Tip and side port of the enteric tube below the diaphragm in the stomach. Nonobstructive bowel gas pattern in the upper abdomen. Soft tissue attenuation from habitus limits detailed assessment. IMPRESSION: Tip and side port of the enteric tube below the diaphragm in the stomach. Electronically Signed   By: Keith Rake M.D.   On: 06/05/2018 03:48   Ct Head Wo Contrast (charm Study)  Result Date: 06/05/2018 CLINICAL DATA:  Encephalopathy. Recent right MCA infarct. EXAM: CT HEAD WITHOUT CONTRAST TECHNIQUE: Contiguous axial images were obtained from the base of the skull through the vertex without intravenous contrast. COMPARISON:  Most recent head CT yesterday at 1148 hour, brain MRI 06/01/2018 FINDINGS: Brain: Progressive cortical low-density in the  right parietooccipital lobe at site of infarct on MRI. Additional developing low-density in the posterior right frontal and right parietal lobes at site of infarcts on MRI. No evidence of hemorrhagic conversion or suspicious mass effect. Remote lacunar infarct in the right caudate. No hydrocephalus or midline shift. No evidence of acute ischemia, mild motion artifact limitations. Vascular: Atherosclerosis of skullbase vasculature without hyperdense vessel or abnormal calcification. Skull: No fracture or focal lesion. Sinuses/Orbits:  No opacification of ethmoid air cells with mucosal thickening may be secondary to intubation. Small left mastoid effusion, unchanged. Other: None. IMPRESSION: 1. Expected evolution of recent parietooccipital and frontoparietal infarcts seen on MRI. No hemorrhagic conversion or significant mass effect. 2. No new abnormality. Electronically Signed   By: Keith Rake M.D.   On: 06/05/2018 01:31    Patient Profile     58 y.o. female hx of COPD on home 02, DM-2, CAD with PCI at [facility] 2006 and [place] in 2008 likely to the LAD, STEMI inf wall with DES to RCA 2015 HTN, HLD, h/o of chronic LLL PE, chromic anemia, and chronic systolic HF for evaluation of acute on chronic CHF at the request of Dr. Tyrell Antonio.  Patient admitted December 30 with severe symptomatic anemia.  Developed volume excess following transfusion.  Hospital course has been complicated by encephalopathy and CVA.  Echocardiogram this admission shows ejection fraction 40 to 45%, akinesis of the distal anteroseptal and apical walls, moderate diastolic dysfunction, moderate right ventricular enlargement and severely elevated pulmonary pressure.  Assessment & Plan    1 Acute on chronic combined systolic/diastolic congestive heart failure-on examination patient remains volume overloaded.  Continue Lasix at present dose.  Follow renal function.  2 VDRF-events yesterday noted.  Patient with decreased level of  responsiveness followed by seizure activity.  This was felt likely acute on chronic hypoxemic and hypercapnic respiratory failure secondary to obesity hypoventilation syndrome, sleep apnea and COPD.  Vent wean per CCM.  3 recent CVA-would resume aspirin 81 mg daily by tube.  4 acute on chronic iron deficiency anemia-hemoglobin stable this AM.  5 CAD-resume ASA; resume statin at discharge.  For questions or updates, please contact [place] Please consult [email or web address] for contact info under        Signed, Kirk Ruths, MD  06/05/2018, 7:29 AM

## 2018-06-05 NOTE — Progress Notes (Signed)
Helping unit RT- RN called and request for vent weaning trial.  On 5/5 vt only 100, minute volume 1.9-2.5 lpm.  PSV increased to 10, then 15, then 18.  Currently spont VT 250-310.  RN aware.  No distress currently noted.

## 2018-06-05 NOTE — Code Documentation (Signed)
  Patient Name: Claudeen Leason   MRN: [id number]   Date of Birth/ Sex: 04-12-1961 , female      Admission Date: 05/29/2018  Attending Provider: [name], MD  Primary Diagnosis: Acute on chronic anemia   Indication: Pt was in her usual state of health until this PM, when she was noted to have decreases responsiveness while on Bipap, followed by a witnessed seizure. Code blue was subsequently called. At the time of arrival on scene, ACLS protocol was underway.   Technical Description:  - CPR performance duration:  Not performed   - Was defibrillation or cardioversion used? No   - Was external pacer placed? No  - Was patient intubated pre/post CPR? Yes   Medications Administered: Y = Yes; Blank = No Amiodarone    Atropine    Calcium    Epinephrine    Lidocaine    Magnesium    Norepinephrine    Phenylephrine    Sodium bicarbonate    Vasopressin     Ativan 2mg  Given  Post CPR evaluation:  - Final Status - Was patient successfully resuscitated ? Yes - What is current rhythm? NSR - What is current hemodynamic status? stable  Miscellaneous Information:  - Labs sent, including: CXR, ABG  - Primary team notified?  Yes  - Family Notified? Yes  - Additional notes/ transfer status: Taken for stat head CT and transferred to ICU 2H03     Isabelle Course, MD  06/05/2018, 2:05 AM

## 2018-06-05 NOTE — Procedures (Signed)
Extubation Procedure Note  Patient Details:   Name: Tulsi Crossett DOB: [birth year] MRN: [id number]   Airway Documentation:    Vent end date: 06/05/18 Vent end time: 1428   Evaluation   O2 sats: stable throughout Complications: No apparent complications Patient did tolerate procedure well. Bilateral Breath Sounds: Diminished   No   Patient was extubated straight to BIPAP 18/5, back up rate: 10, FIO2: 40% per CCM order. Patient pulling VT's in the 300's on 18/5 with snoring respirations. CCM resident at the bedside & is aware of this.   Uchenna Seufert, Eddie North 06/05/2018, 2:28 PM

## 2018-06-05 NOTE — Progress Notes (Signed)
[facility] Progress Note Patient Name: Tamara Silva DOB: Sep 28, 1960 MRN: [id number]   Date of Service  06/05/2018  HPI/Events of Note  ABG on 100%/PRVC 16/TV 500/P 5 = 7.54/64.1/510.0  eICU Interventions  Will order: 1. Decrease PRVC rate to 14. 2. Repeat ABG at 8:30 AM. 3. Wean FiO2 as tolerated.      Intervention Category Major Interventions: Acid-Base disturbance - evaluation and management;Respiratory failure - evaluation and management  Sommer,Steven Eugene 06/05/2018, 7:04 AM

## 2018-06-05 NOTE — Progress Notes (Signed)
Called Elink to notify recent ABG blood gas... Left message w/Gretchen.

## 2018-06-06 LAB — COMPREHENSIVE METABOLIC PANEL
ALK PHOS: 67 U/L (ref 38–126)
ALT: 14 U/L (ref 0–44)
AST: 25 U/L (ref 15–41)
Albumin: 2.5 g/dL — ABNORMAL LOW (ref 3.5–5.0)
Anion gap: 6 (ref 5–15)
BUN: 16 mg/dL (ref 6–20)
CO2: 42 mmol/L — ABNORMAL HIGH (ref 22–32)
CREATININE: 0.94 mg/dL (ref 0.44–1.00)
Calcium: 8.6 mg/dL — ABNORMAL LOW (ref 8.9–10.3)
Chloride: 93 mmol/L — ABNORMAL LOW (ref 98–111)
GFR calc Af Amer: >60 mL/min (ref >60)
GFR calc non Af Amer: >60 mL/min (ref >60)
Glucose, Bld: 79 mg/dL (ref 70–99)
Potassium: 3.8 mmol/L (ref 3.5–5.1)
Sodium: 141 mmol/L (ref 135–145)
Total Bilirubin: 1.5 mg/dL — ABNORMAL HIGH (ref 0.3–1.2)
Total Protein: 8.8 g/dL — ABNORMAL HIGH (ref 6.5–8.1)

## 2018-06-06 LAB — CBC
HCT: 36.2 % (ref 36.0–46.0)
Hemoglobin: 9 g/dL — ABNORMAL LOW (ref 12.0–15.0)
MCH: 20.2 pg — ABNORMAL LOW (ref 26.0–34.0)
MCHC: 24.9 g/dL — ABNORMAL LOW (ref 30.0–36.0)
MCV: 81.3 fL (ref 80.0–100.0)
Platelets: 381 10*3/uL (ref 150–400)
RBC: 4.45 MIL/uL (ref 3.87–5.11)
RDW: 31.6 % — ABNORMAL HIGH (ref 11.5–15.5)
WBC: 9 10*3/uL (ref 4.0–10.5)
nRBC: 0.2 % (ref 0.0–0.2)

## 2018-06-06 LAB — GLUCOSE, CAPILLARY
Glucose-Capillary: 67 mg/dL — ABNORMAL LOW (ref 70–99)
Glucose-Capillary: 68 mg/dL — ABNORMAL LOW (ref 70–99)
Glucose-Capillary: 104 mg/dL — ABNORMAL HIGH (ref 70–99)
Glucose-Capillary: 83 mg/dL (ref 70–99)
Glucose-Capillary: 92 mg/dL (ref 70–99)
Glucose-Capillary: 142 mg/dL — ABNORMAL HIGH (ref 70–99)
Glucose-Capillary: 228 mg/dL — ABNORMAL HIGH (ref 70–99)

## 2018-06-06 LAB — POCT I-STAT 3, ART BLOOD GAS (G3+)
Acid-Base Excess: 22 mmol/L — ABNORMAL HIGH (ref 0.0–2.0)
Bicarbonate: 50.4 mmol/L — ABNORMAL HIGH (ref 20.0–28.0)
O2 Saturation: 97 %
pCO2 arterial: 76.8 mmHg (ref 32.0–48.0)
pH, Arterial: 7.422 (ref 7.350–7.450)
pO2, Arterial: 90 mmHg (ref 83.0–108.0)

## 2018-06-06 LAB — AMMONIA: Ammonia: 55 umol/L — ABNORMAL HIGH (ref 9–35)

## 2018-06-06 MED ORDER — SODIUM CHLORIDE 0.9 % IV SOLN: 510.0000 mg | INTRAVENOUS | Status: DC

## 2018-06-06 MED ORDER — CHLORHEXIDINE GLUCONATE 0.12 % MT SOLN
OROMUCOSAL | Status: AC
  Filled 2018-06-06: qty 15

## 2018-06-06 MED ORDER — SODIUM CHLORIDE 0.9 % IV SOLN
510.0000 mg | INTRAVENOUS | Status: DC
  Administered 2018-06-06: 510 mg via INTRAVENOUS
  Filled 2018-06-06: qty 17

## 2018-06-06 MED ORDER — TORSEMIDE 20 MG PO TABS
60.0000 mg | ORAL_TABLET | Freq: Every day | ORAL | Status: DC
  Administered 2018-06-07 – 2018-06-09 (×3): 60 mg via ORAL
  Filled 2018-06-06 (×3): qty 3

## 2018-06-06 MED ORDER — ADULT MULTIVITAMIN LIQUID CH
15.0000 mL | Freq: Every day | ORAL | Status: DC
  Administered 2018-06-06 – 2018-06-08 (×3): 15 mL via ORAL
  Filled 2018-06-06 (×4): qty 15

## 2018-06-06 MED ORDER — DEXTROSE 5 % IV SOLN
INTRAVENOUS | Status: DC
  Administered 2018-06-06: 05:00:00 via INTRAVENOUS

## 2018-06-06 MED ORDER — DEXTROSE 50 % IV SOLN
25.0000 mL | Freq: Once | INTRAVENOUS | Status: AC
  Administered 2018-06-06: 25 mL via INTRAVENOUS
  Filled 2018-06-06: qty 50

## 2018-06-06 MED FILL — Medication: Qty: 1 | Status: AC

## 2018-06-06 NOTE — Progress Notes (Signed)
[facility] Progress Note Patient Name: Tamara Silva DOB: 1960-10-19 MRN: [id number]   Date of Service  06/06/2018  HPI/Events of Note  Blood glucose = 68.   eICU Interventions  Will order: 1. D50 25 mL IV now.  2. D5W IV infusion to run at 40 mL/hour.      Intervention Category Major Interventions: Other:  Sommer,Steven Cornelia Copa 06/06/2018, 4:26 AM

## 2018-06-06 NOTE — Progress Notes (Signed)
NAMETemperence Silva, MRN:  [id number], DOB:  April 05, 1961, LOS: 7 ADMISSION DATE:  05/29/2018, CONSULTATION DATE:  06/05/18 REFERRING MD:  [name], MD, CHIEF COMPLAINT:  Respiratory arrest, acute respiratory failure   Brief History   This is a 58 year old female, history of OSA, COPD, chronic hypoxic respiratory failure(4 L at home), iron deficiency anemia who was admitted to hospital on 12/30 for generalized weakness and anemia, slurred speech, and a found to have a right MCA/PCA watershed area stroke and a 2 cm infarct in the inferior right parietal lobe (felt to be incidental), with hypercapnic respiratory failure.  Placed on BiPAP but had been tolerating it well.  Patient became unresponsive last night, had a generalized tonic-clonic seizure, apnea, and pulselessness.  CODE BLUE was called.  Patient was intubated and transferred to ICU.  History of present illness   This 58 y.o. African-American female reformed smoker is seen in consultation at the request of Dr. [name] for recommendations on further evaluation and management of acute respiratory failure and respiratory arrest.  The patient is seen in 07/18/2001, where the patient is transferred after experiencing respiratory arrest on 3 W.  Patient's family is at the bedside and reports that the patient acutely demonstrated altered mental status and went from being fully conversant to unresponsive (around 2340), even to noxious stimuli.  Rapid response team was called at 2350 due to oxygen desaturation despite BiPAP 15/5, FiO2 40%.  Despite increasing FiO2 to 100% (with commensurate improvement in SPO2), the patient was noted to be unresponsive.  The patient reportedly had a 5 to 6-second generalized tonic-clonic seizure and was subsequently noted to be apneic and pulseless.  CODE BLUE was called.   At the time of clinical interview, the patient is awake and anxious despite propofol infusion at 80 mcg/kg/min.  She initially presented  to [facility] on 05/29/2018 with complaints of generalized weakness and was found to be profoundly anemic (hemoglobin 5.7).  According to the patient's family, the patient presented with slurred speech and encephalopathy, prompting an initial stroke evaluation.  The patient was found to have a right MCA/PCA watershed area stroke in addition to a 2 cm infarct in the inferior right parietal lobe (which were felt to be incidental findings), and it was felt that the patient's primary pathologies stemmed from acute hypercapnic respiratory failure and was initiated on BiPAP support.  Review of the documentation suggests that the patient has had less than satisfactory degrees of compliance to BiPAP therapy (has been removing BiPAP mask).  She is a morbidly obese female with known drug of sleep apnea, COPD and chronic hypoxemic respiratory failure (on 4 LPM supplemental oxygen via nasal cannula at home).  She also has a known history of chronic iron deficiency anemia with a baseline hemoglobin of 8-9.  Past Medical History  Asthma, combined CHF, chronic iron deficiency anemia, diabetes mellitus, hypertension  Significant Hospital Events   05/29/2018: Presented to [facility] long ER with complaints of generalized weakness, found to have hemoglobin 5.7.  Acute on chronic iron deficiency anemia.  Stroke evaluation revealed right MCA/PCA watershed and right parietal embolic stroke.  Transferred to [facility]. 06/04/2018: Rapid response for altered mental status.  5 to 6-minute generalized tonic-clonic seizure.  CODE BLUE for respiratory arrest.  Possible postictal state.  Transferred to ICU ([place]).  Consults:  Neurology  Procedures:  06/05/18 ET tube  Significant Diagnostic Tests:    Micro Data:    Antimicrobials:     Interim history/subjective:  Patient is more awake and alert today, currently on BiPAP.  She is having a lot of sputum production.  Denies any pain at the moment.   Objective     Blood pressure 111/65, pulse 70, temperature (!) 96.9 F (36.1 C), temperature source Axillary, resp. rate (!) 25, height 5' 7.5" (1.715 m), weight (!) 147 kg, last menstrual period 07/28/2013, SpO2 100 %.    Vent Mode: BIPAP FiO2 (%):  [40 %] 40 % Set Rate:  [10 bmp-14 bmp] 10 bmp Vt Set:  [500 mL] 500 mL PEEP:  [5 cmH20] 5 cmH20 Pressure Support:  [18 cmH20] 18 cmH20 Plateau Pressure:  [35 cmH20] 35 cmH20   Intake/Output Summary (Last 24 hours) at 06/06/2018 0842 Last data filed at 06/06/2018 0600 Gross per 24 hour  Intake 484.81 ml  Output 1475 ml  Net -990.19 ml   Filed Weights   05/31/18 2050 06/05/18 0500 06/06/18 0500  Weight: (!) 149.7 kg (!) 148.5 kg (!) 147 kg    Examination: General: On [place], no acute distress, awake and alert HENT: Atraumatic, normocephalic Lungs: Symmetric breath sounds, bilateral coarse crackles Cardiovascular: RRR, no murmurs rubs or gallops Abdomen: Mildly distended, positive bowel sounds Extremities: 1+ bilateral lower extremity edema Neuro:Alert and oriented x3  Resolved Hospital Problem list   None  Assessment & Plan:  Acute on chronic hypoxic and hypercapnic respiratory failure COPD Respiratory arrest Patient was extubated yesterday and is currently on BiPAP.  She is having a lot more alertness today.  Bilateral coarse crackles heard on exam.  ABG this morning did show a elevation in her CO2 pH 7.38, CO2 87, up to 101, bicarb 51.  Patient has chronic hypercapnia that is compensated.  She significant obstructive sleep apnea that likely causes her CO2 to increase at night.  She is on CPAP at home but states that it is not a good machine.  Will likely need a new CPAP on discharge Plan: -Continue albuterol every 4 hours -Continue BiPAP at night in the hospital -Case management consult  Embolic stroke, right MCA/PCA watershed, right parietal Tonic-clonic seizures: No history of seizures, it's possible that episode was 2/2 the acute respiratory  failure.  -Status post Keppra load -EEG no acute seizures -Frequent neuro checks -Discontinue keppra -Continue home ASA and statin  Combined systolic and diastolic heart failure: Patient appears euvolemic on exam.  She has not been having a lot of output, kidney function has remained stable, likely does not need aggressive diuresis now. -Discontinue Lasix 80 mg twice daily -Start torsemide 60 mg daily, has better absorption and dosed daily -Continue Coreg 3.125 twice daily  Elevated ammonia levels.  Patient has received lactulose and was getting lactulose enemas. I think that the encephalopathy is related to the hypercarbic respiratory failure, do not think that there is hepatic encephalopathy.   -CMP showed normal AST and ALT.  -Ammonia levels improved -Will discontinue lactulose and continue to monitor symptoms  Acute on chronic iron deficiency anemia, thrombocytosis: -Admission was 5.7. S/p 3U pRBC on 12/30 and 1/2. Hemoglobin today was 9, close to her baseline.  Platelets were 402. -Transfuse goal >7 -Continue Protonix  Type 2 diabetes: -Frequent CBGs, sliding scale insulin, continue Levemir  Best practice:  Diet: Restart diet Pain/Anxiety/Delirium protocol (if indicated): Fentanyl, versed, propofol PRN VAP protocol (if indicated): yes DVT prophylaxis: Heparin  GI prophylaxis: Protonix Glucose control: SSI Mobility: Bedbound Code Status: Full Family Communication: Spoke with patient Disposition: Transfer to stepdown  Labs   CBC: Recent Labs  Lab 06/01/18 1024 06/02/18 0518 06/03/18 0653 06/04/18 0824 06/05/18 0129  WBC 7.5 7.2 6.8 8.1 9.1  HGB 7.4* 7.6* 7.8* 7.9* 8.4*  HCT 28.3* 30.0* 30.3* 32.7* 34.5*  MCV 76.9* 79.2* 81.5 82.6 84.1  PLT 507* 470* 447* 430* 402*    Basic Metabolic Panel: Recent Labs  Lab 06/02/18 0518 06/03/18 0653 06/04/18 0824 06/05/18 0129 06/05/18 0602  NA 138 140 140 136 140  K 4.2 4.3 4.9 6.2* 4.1  CL 92* 94* 93* 91* 93*    CO2 36* 41* 42* 34* 41*  GLUCOSE 144* 112* 88 134* 86  BUN 27* 21* 21* 21* 19  CREATININE 1.29* 1.02* 1.02* 1.10* 1.04*  CALCIUM 8.2* 8.3* 8.4* 8.3* 8.6*   GFR: Estimated Creatinine Clearance: 89.8 mL/min (A) (by C-G formula based on SCr of 1.04 mg/dL (H)). Recent Labs  Lab 06/02/18 0518 06/03/18 0653 06/04/18 0824 06/05/18 0129 06/05/18 0137  WBC 7.2 6.8 8.1 9.1  --   LATICACIDVEN  --   --   --   --  1.4    Liver Function Tests: No results for input(s): AST, ALT, ALKPHOS, BILITOT, PROT, ALBUMIN in the last 168 hours. No results for input(s): LIPASE, AMYLASE in the last 168 hours. Recent Labs  Lab 06/01/18 0323 06/05/18 0040  AMMONIA 94* 138*    ABG    Component Value Date/Time   PHART 7.381 06/05/2018 1833   PCO2ART 87.0 (HH) 06/05/2018 1833   PO2ART 101.0 06/05/2018 1833   HCO3 51.8 (H) 06/05/2018 1833   TCO2 >50 (H) 06/05/2018 1833   O2SAT 97.0 06/05/2018 1833     Coagulation Profile: No results for input(s): INR, PROTIME in the last 168 hours.  Cardiac Enzymes: Recent Labs  Lab 05/30/18 1300  TROPONINI <0.03    HbA1C: Hemoglobin A1C  Date/Time Value Ref Range Status  01/23/2018 02:46 PM 8.0 (A) 4.0 - 5.6 % Final  10/06/2016 04:43 PM 10.0  Final   HbA1c, POC (prediabetic range)  Date/Time Value Ref Range Status  01/23/2018 02:46 PM 8 (A) 5.7 - 6.4 % Final   HbA1c, POC (controlled diabetic range)  Date/Time Value Ref Range Status  01/23/2018 02:46 PM 8.0 (A) 0.0 - 7.0 % Final   HbA1c POC (<> result, manual entry)  Date/Time Value Ref Range Status  01/23/2018 02:46 PM 8 4.0 - 5.6 % Final   Hgb A1c MFr Bld  Date/Time Value Ref Range Status  06/01/2018 10:24 AM 7.1 (H) 4.8 - 5.6 % Final    Comment:    (NOTE) Pre diabetes:          5.7%-6.4% Diabetes:              >6.4% Glycemic control for   <7.0% adults with diabetes   05/21/2015 05:26 AM 7.9 (H) 4.8 - 5.6 % Final    Comment:    (NOTE)         Pre-diabetes: 5.7 - 6.4          Diabetes: >6.4         Glycemic control for adults with diabetes: <7.0     CBG: Recent Labs  Lab 06/05/18 2333 06/06/18 0352 06/06/18 0412 06/06/18 0450 06/06/18 0742  GLUCAP 77 67* 68* 104* 83    Review of Systems:   This patient is critically ill and cannot provide additional history nor review of systems due to endotracheally intubated and unable to speak.  Past Medical History  She,  has a past medical history of Asthma, Chronic  combined systolic (congestive) and diastolic (congestive) heart failure (HCC), Chronic iron deficiency anemia, Diabetes mellitus without complication ([place]), and Hypertension.   Surgical History    Past Surgical History:  Procedure Laterality Date  . COLONOSCOPY WITH PROPOFOL N/A 08/03/2013   Procedure: COLONOSCOPY WITH PROPOFOL;  Surgeon: Jeryl Columbia, MD;  Location: [facility] ENDOSCOPY;  Service: Endoscopy;  Laterality: N/A;  . CORONARY ANGIOPLASTY WITH STENT PLACEMENT     CAD in 2006 x 2 and 2009 2 more- place din REx in [place] and [place] [facility]  . CORONARY ANGIOPLASTY WITH STENT PLACEMENT  10/07/2013   Xience [place] DES 2.75  mm x 15  mm  . ESOPHAGOGASTRODUODENOSCOPY (EGD) WITH PROPOFOL N/A 08/03/2013   Procedure: ESOPHAGOGASTRODUODENOSCOPY (EGD) WITH PROPOFOL;  Surgeon: Jeryl Columbia, MD;  Location: [facility];  Service: Endoscopy;  Laterality: N/A;  . LEFT HEART CATHETERIZATION WITH CORONARY ANGIOGRAM N/A 10/07/2013   Procedure: LEFT HEART CATHETERIZATION WITH CORONARY ANGIOGRAM;  Surgeon: Leonie Man, MD;  Location[facility] CATH LAB;  Service: Cardiovascular;  Laterality: N/A;     Social History   reports that she quit smoking about 4 years ago. Her smoking use included cigarettes. She has a 10.00 pack-year smoking history. She has never used smokeless tobacco. She reports that she does not drink alcohol or use drugs.   Family History   Her family history includes Asthma in her paternal aunt; Breast cancer in her paternal aunt; Colon cancer in her father;  Diabetes in her father; Emphysema in her brother; Heart attack in her father; Hypertension in her mother; Lung disease in her brother; Sarcoidosis in an other family member; Stroke in her mother.   Allergies Allergies  Allergen Reactions  . Metolazone Other (See Comments)    Dizziness and falling  . Plavix [Clopidogrel Bisulfate] Other (See Comments)    High PRU's - non-responder  . Ibuprofen     Patient stated she can not take ibuprofen.   . Nsaids Other (See Comments)    "I do not take NSAIDs d/t interfering with other meds"     Home Medications  Prior to Admission medications   Medication Sig Start Date End Date Taking? Authorizing Provider  ACCU-CHEK AVIVA PLUS test strip USE 1 STRIP TO CHECK GLUCOSE 3 TIMES DAILY 03/30/18  Yes Fulp, Cammie, MD  ACCU-CHEK FASTCLIX LANCETS MISC 1 applicator by Does not apply route 4 (four) times daily -  before meals and at bedtime. 08/07/15  Yes Charlott Rakes, MD  ACCU-CHEK SOFTCLIX LANCETS lancets USE 1 LANCET TO CHECK GLUCOSE 3 TIMES DAILY 01/14/16  Yes Tresa Garter, MD  aspirin 81 MG tablet Take 1 tablet (81 mg total) by mouth daily. 10/09/13  Yes Barrett, Evelene Croon, PA-C  Blood Glucose Monitoring Suppl (ACCU-CHEK AVIVA) device Use as instructed 3 times daily before meals. 08/06/15  Yes Newlin, Charlane Ferretti, MD  carvedilol (COREG) 12.5 MG tablet TAKE 1 TABLET BY MOUTH 2 TIMES A DAY WITH A MEAL **PT NEEDS TO SCHEDULE AN APPT WITH OUR OFFICE AT [phone] FOR FUTURE REFILLS 04/25/18  Yes Larey Dresser, MD  fluticasone (FLONASE) 50 MCG/ACT nasal spray INSTILL 2 SPRAYS IN EACH NOSTRIL TWICE DAILY. 04/08/17  Yes Tresa Garter, MD  furosemide (LASIX) 40 MG tablet Take 1.5 tablets (60 mg total) by mouth 2 (two) times daily. 02/24/18 02/24/19 Yes Fulp, Cammie, MD  Insulin Pen Needle (TRUEPLUS PEN NEEDLES) 32G X 4 MM MISC Use three times daily to inject insulin 02/02/18  Yes Fulp, Cammie, MD  INSULIN  SYRINGE .5CC/29G 29G X 1/2" 0.5 ML MISC Use as  instructed by physician 01/27/18  Yes Fulp, Cammie, MD  ipratropium-albuterol (DUONEB) 0.5-2.5 (3) MG/3ML SOLN Use nebulizer to inhale contents of one vial every 6 hours as needed for shortness of breath, wheezing 01/27/18  Yes Fulp, Cammie, MD  LEVEMIR 100 UNIT/ML injection INJECT 45 UNITS SUBCUTANEOUSLY EVERY NIGHT AT BEDTIME *MUST HAVE OFFICE VISIT FOR REFILLS* 02/24/18  Yes Fulp, Cammie, MD  losartan (COZAAR) 50 MG tablet Take 1 tablet (50 mg total) by mouth daily. 01/27/18  Yes Fulp, Cammie, MD  nitroGLYCERIN (NITROSTAT) 0.4 MG SL tablet Place 1 tablet (0.4 mg total) under the tongue every 5 (five) minutes as needed for chest pain. 01/27/18  Yes Fulp, Cammie, MD  NOVOLOG FLEXPEN 100 UNIT/ML FlexPen INJECT 15-20 UNITS SUBCUTANEOUSLY 3 TIMES DAILY WITH MEALS. **PATIENT MUST HAVE OFFICE VISIT FOR FUTURE REFILLS** 02/24/18  Yes Fulp, Cammie, MD  omeprazole (PRILOSEC) 40 MG capsule TAKE 1 CAPSULE BY MOUTH TWICE DAILY. 04/08/17  Yes Tresa Garter, MD  polyethylene glycol (MIRALAX / GLYCOLAX) packet Take 17 g by mouth daily. 01/27/18  Yes Fulp, Cammie, MD  prasugrel (EFFIENT) 10 MG TABS tablet TAKE ONE TABLET BY MOUTH DAILY. 05/29/18  Yes Bensimhon, Shaune Pascal, MD  rosuvastatin (CRESTOR) 10 MG tablet Take 1 tablet (10 mg total) by mouth daily. 01/27/18  Yes Fulp, Cammie, MD  spironolactone (ALDACTONE) 25 MG tablet Take 1 tablet (25 mg total) by mouth daily. 01/27/18  Yes Fulp, Cammie, MD  iron polysaccharides (NIFEREX) 150 MG capsule Take 1 capsule (150 mg total) by mouth daily. Patient not taking: Reported on 05/29/2018 01/27/18   Fulp, Cammie, MD  prazosin (MINIPRESS) 5 MG capsule TAKE 1 CAPSULE BY MOUTH EVERY NIGHT AT BEDTIME FOR NIGHTMARES. Patient not taking: Reported on 05/29/2018 03/30/18   Fulp, Ander Gaster, MD  PROAIR HFA 108 (90 Base) MCG/ACT inhaler INHALE 2 PUFFS BY MOUTH 4 TIMES DAILY AS NEEDED Patient not taking: Reported on 05/29/2018 02/24/18   Fulp, Ander Gaster, MD  senna (SENOKOT) 8.6 MG TABS tablet  Take 1 tablet (8.6 mg total) by mouth 2 (two) times daily. Patient not taking: Reported on 05/29/2018 01/18/18   Doreatha Lew, MD     Critical care time: 79       Asencion Noble, M.D. PGY1 Pager [phone] 06/06/2018 8:42 AM

## 2018-06-06 NOTE — Progress Notes (Signed)
Called E-link... Spoke w/Dr. Oletta Darter in re: to Lactulose via tube.   Pt was extubated earlier, therefore does not have OG/NG tube.   Pt not following commands and is on bi-pap. Do not feel comfortable giving pt PO meds.   New orders placed... see MAR and Dr. Levada Schilling note.

## 2018-06-06 NOTE — Progress Notes (Signed)
Called E link and spoke w/Dr. Tamala Julian in re: to pt's CBG = 228  Pt is off all IV gtt at the moment... Dextrose w/stopped around 1500 today ... pt was able to eat some according to day shift   Pt does not have sliding scale coverage oders  Per Dr. Tamala Julian, wait until 0000 cbg to see how it trends... in the mean time, will continue to monitor pt closely.   Pt is alert... no acute distress.... Pt is resting comfortable.

## 2018-06-06 NOTE — Evaluation (Signed)
Occupational Therapy Evaluation Patient Details Name: Tamara Silva MRN: [id number] DOB: 14-Oct-1960 Today's Date: 06/06/2018    History of Present Illness Pt is a 58 y.o. female admitted to [facility] on 05/29/18 with severe symptomatic anemia, volume overload and respiratory failure. Hospital course complicated by encephalopathy and speech disturbance. MRI noted acute infarct in R parietal lobe, R MCA/PCA watershed area; chronic lacunar infarct R caudate nucleus. Pt transferred to [facility] for stroke workup. ECG shows no seizure activity. Code blue called 1/6 due to seizure and unresponsiveness; ETT 1/6-1/7. Head CT 1/6 showed expected evolution of recent parietooccipital and frontoparietal infarcts, no hemorrhagic conversion or significant mass. PMH includes asthma, HF, anemia, DM, HTN.   Clinical Impression   Pt seen with PT.  She requires min A to move to EOB, mod A +2 for functional transfers, and min - total A for ADLs.  She is very slow to initiate activity, and demonstrates focused to sustained attention as well as deficits with problem solving, sequencing, and awareness.   Discharge plan changed, as she is making steady progress toward goals and was mod I PTA.   Recommend CIR to allow her to maximize her safety and independence with ADLs as well as reduce risk of falls and readmission.  Will continue to follow acutely.       Follow Up Recommendations  CIR;Supervision/Assistance - 24 hour    Equipment Recommendations  None recommended by OT    Recommendations for Other Services Rehab consult     Precautions / Restrictions Precautions Precautions: Fall Restrictions Weight Bearing Restrictions: No      Mobility Bed Mobility Overal bed mobility: Needs Assistance Bed Mobility: Supine to Sit     Supine to sit: Min assist     General bed mobility comments: Pt demonstrates difficulty intiating, sequencing and problem solving through how to move to EOB.  She initially turns on her Lt side,  and then stops.  She requires cues and instruction to re-initiate and complete task   Transfers Overall transfer level: Needs assistance Equipment used: 2 person hand held assist Transfers: Sit to/from [facility] Sit to Stand: Min assist;+2 physical assistance;+2 safety/equipment Stand pivot transfers: Mod assist;+2 physical assistance;+2 safety/equipment       General transfer comment: cues to initiate activity and cues for hand placement.  She stands with assist to steady, and then step by step cues for pivot, and assist to maintain balance and control descent     Balance Overall balance assessment: Needs assistance Sitting-balance support: Feet supported Sitting balance-Leahy Scale: Fair Sitting balance - Comments: able to maintain static sitting with min guard assist.  Pt somewhat impulsive so close min guard assist required    Standing balance support: Bilateral upper extremity supported Standing balance-Leahy Scale: Poor Standing balance comment: requires min A +2 to maintain static standing                            ADL either performed or assessed with clinical judgement   ADL Overall ADL's : Needs assistance/impaired Eating/Feeding: Minimal assistance;Sitting Eating/Feeding Details (indicate cue type and reason): pt request applesauce, but when it is handed to her, she passively stares at it, and at times, refuses to self feed.  Cues provided to perform on her own.  Once she gets started, she is able to continue task with supervision  Grooming: Wash/dry hands;Minimal assistance;Sitting Grooming Details (indicate cue type and reason): assist to initiate and for thoroughness  Upper  Body Bathing: Maximal assistance;Sitting   Lower Body Bathing: Maximal assistance;Sit to/from stand   Upper Body Dressing : Maximal assistance;Sitting   Lower Body Dressing: Total assistance;Sit to/from stand   Toilet Transfer: Moderate assistance;BSC    Toileting- Clothing Manipulation and Hygiene: Total assistance;Sit to/from stand       Functional mobility during ADLs: Moderate assistance;+2 for physical assistance;+2 for safety/equipment       Vision   Additional Comments: Pt still unable to participate in formal assessment due to cognitive impairment      Perception     Praxis      Pertinent Vitals/Pain Pain Assessment: No/denies pain     Hand Dominance     Extremity/Trunk Assessment Upper Extremity Assessment Upper Extremity Assessment: Generalized weakness;RUE deficits/detail RUE Coordination: decreased fine motor   Lower Extremity Assessment Lower Extremity Assessment: Defer to PT evaluation       Communication     Cognition Arousal/Alertness: Awake/alert Behavior During Therapy: Flat affect Overall Cognitive Status: Impaired/Different from baseline Area of Impairment: Attention;Memory;Following commands;Safety/judgement;Awareness;Problem solving                   Current Attention Level: Sustained;Focused Memory: Decreased short-term memory Following Commands: Follows one step commands consistently;Follows one step commands with increased time Safety/Judgement: Decreased awareness of safety;Decreased awareness of deficits   Problem Solving: Slow processing;Decreased initiation;Difficulty sequencing;Requires verbal cues;Requires tactile cues General Comments: Pt demonstrates focused to sustained attention, and is unable to shift attention.  She is very slow to respond to questions and very slow to initiate.  She will, at times, fixate on something, and difficult to redirect her attention.   Mod cues for simple problem solving    General Comments  HR 81; 02 sats 03% on 5L supplemental 02; BP 123/65    Exercises     Shoulder Instructions      Home Living                                          Prior Functioning/Environment                   OT Problem List:         OT Treatment/Interventions:      OT Goals(Current goals can be found in the care plan section)    OT Frequency: Min 2X/week   Barriers to D/C:            Co-evaluation PT/OT/SLP Co-Evaluation/Treatment: Yes Reason for Co-Treatment: Complexity of the patient's impairments (multi-system involvement);Necessary to address cognition/behavior during functional activity;For patient/therapist safety;To address functional/ADL transfers   OT goals addressed during session: ADL's and self-care      AM-PAC OT "6 Clicks" Daily Activity     Outcome Measure Help from another person eating meals?: A Little Help from another person taking care of personal grooming?: A Lot Help from another person toileting, which includes using toliet, bedpan, or urinal?: A Lot Help from another person bathing (including washing, rinsing, drying)?: A Lot Help from another person to put on and taking off regular upper body clothing?: A Lot Help from another person to put on and taking off regular lower body clothing?: A Lot 6 Click Score: 13   End of Session Equipment Utilized During Treatment: Oxygen Nurse Communication: Mobility status  Activity Tolerance: Patient tolerated treatment well Patient left: in chair;with call bell/phone within reach;with chair alarm set  OT Visit Diagnosis: Unsteadiness on feet (R26.81);Cognitive communication deficit (R41.841) Symptoms and signs involving cognitive functions: Cerebral infarction                Time: 9800-1239 OT Time Calculation (min): 26 min Charges:  OT General Charges $OT Visit: 1 Visit OT Treatments $Therapeutic Activity: 8-22 mins  Lucille Passy, OTR/L [place] Pager [phone] Office [phone]   Lucille Passy M 06/06/2018, 11:15 AM

## 2018-06-06 NOTE — Progress Notes (Signed)
Patient placed off bipap per ABG results. Patient tolerating well on 5L [place].

## 2018-06-06 NOTE — Progress Notes (Addendum)
Physical Therapy Treatment Patient Details Name: Tamara Silva MRN: [id number] DOB: Apr 11, 1961 Today's Date: 06/06/2018    History of Present Illness Pt is a 58 y.o. female admitted to [facility] on 05/29/18 with severe symptomatic anemia, volume overload and respiratory failure. Hospital course complicated by encephalopathy and speech disturbance. MRI noted acute infarct in R parietal lobe, R MCA/PCA watershed area; chronic lacunar infarct R caudate nucleus. Pt transferred to [facility] for stroke workup. ECG shows no seizure activity. Code blue called 1/6 due to seizure and unresponsiveness; ETT 1/6-1/7. Head CT 1/6 showed expected evolution of recent parietooccipital and frontoparietal infarcts, no hemorrhagic conversion or significant mass. PMH includes asthma, HF, anemia, DM, HTN.   PT Comments    Pt progressing with mobility. MinA for bed mobility; able to stand and take pivotal steps to recliner with modA+2 and bilateral HHA. Pt with poor attention and problem solving, demonstrates slowed initiation requiring frequent cues to attend to task. Pt making steady progress and feel should could benefit from intensive CIR-level therapies to maximize functional mobility and independence, as well as reduce fall risk and readmission; discharge plan updated. Will follow acutely to address established goals.    Follow Up Recommendations  CIR;Supervision/Assistance - 24 hour     Equipment Recommendations  (TBD)    Recommendations for Other Services       Precautions / Restrictions Precautions Precautions: Fall Restrictions Weight Bearing Restrictions: No    Mobility  Bed Mobility Overal bed mobility: Needs Assistance Bed Mobility: Supine to Sit     Supine to sit: Min assist     General bed mobility comments: Pt demonstrates difficulty intiating, sequencing and problem solving through how to move to EOB.  She initially turns on her Lt side, and then stops.  She requires cues and instruction to  re-initiate and complete task   Transfers Overall transfer level: Needs assistance Equipment used: 2 person hand held assist Transfers: Sit to/from Stand Sit to Stand: Mod assist;+2 safety/equipment;+2 physical assistance Stand pivot transfers: Mod assist;+2 physical assistance;+2 safety/equipment       General transfer comment: cues to initiate activity and cues for hand placement.  She stands with assist to steady, and then step by step cues for pivot, and assist to maintain balance and control descent   Ambulation/Gait Ambulation/Gait assistance: Mod assist;+2 physical assistance;+2 safety/equipment Gait Distance (Feet): 1 Feet Assistive device: 2 person hand held assist Gait Pattern/deviations: Leaning posteriorly;Step-to pattern Gait velocity: Decreased   General Gait Details: Took pivotal steps from bed to recliner with modA+2 and bilateral HHA to prevent posterior LOB; step by step cues and to prevent premature sitting   Stairs             Wheelchair Mobility    Modified Rankin (Stroke Patients Only)       Balance Overall balance assessment: Needs assistance Sitting-balance support: Feet supported Sitting balance-Leahy Scale: Fair Sitting balance - Comments: able to maintain static sitting with min guard assist.  Pt somewhat impulsive so close min guard assist required    Standing balance support: Bilateral upper extremity supported Standing balance-Leahy Scale: Poor Standing balance comment: requires min-modA +2 to maintain static standing                             Cognition Arousal/Alertness: Awake/alert Behavior During Therapy: Flat affect Overall Cognitive Status: Impaired/Different from baseline Area of Impairment: Attention;Memory;Following commands;Safety/judgement;Awareness;Problem solving  Current Attention Level: Sustained;Focused Memory: Decreased short-term memory Following Commands: Follows one step  commands consistently;Follows one step commands with increased time Safety/Judgement: Decreased awareness of safety;Decreased awareness of deficits Awareness: Emergent Problem Solving: Slow processing;Decreased initiation;Difficulty sequencing;Requires verbal cues;Requires tactile cues General Comments: Pt demonstrates focused to sustained attention, and is unable to shift attention.  She is very slow to respond to questions and very slow to initiate.  She will, at times, fixate on something, and difficult to redirect her attention.   Mod cues for simple problem solving       Exercises General Exercises - Lower Extremity Long Arc Quad: AROM;Both;Seated Hip Flexion/Marching: AROM;Both;Seated    General Comments General comments (skin integrity, edema, etc.): HR 81, BP 123/65, SpO2 >93% on 5L O2 [place]      Pertinent Vitals/Pain Pain Assessment: No/denies pain    Home Living                      Prior Function            PT Goals (current goals can now be found in the care plan section) Acute Rehab PT Goals Patient Stated Goal: Bites of applesauce PT Goal Formulation: With patient Time For Goal Achievement: 06/15/18 Potential to Achieve Goals: Fair Progress towards PT goals: Progressing toward goals    Frequency    Min 3X/week      PT Plan Frequency needs to be updated    Co-evaluation PT/OT/SLP Co-Evaluation/Treatment: Yes Reason for Co-Treatment: Complexity of the patient's impairments (multi-system involvement);Necessary to address cognition/behavior during functional activity;For patient/therapist safety;To address functional/ADL transfers PT goals addressed during session: Mobility/safety with mobility;Balance OT goals addressed during session: ADL's and self-care      AM-PAC PT "6 Clicks" Mobility   Outcome Measure  Help needed turning from your back to your side while in a flat bed without using bedrails?: A Little Help needed moving from lying on your  back to sitting on the side of a flat bed without using bedrails?: A Little Help needed moving to and from a bed to a chair (including a wheelchair)?: A Lot Help needed standing up from a chair using your arms (e.g., wheelchair or bedside chair)?: A Lot Help needed to walk in hospital room?: A Lot Help needed climbing 3-5 steps with a railing? : Total 6 Click Score: 13    End of Session Equipment Utilized During Treatment: Gait belt;Oxygen Activity Tolerance: Patient tolerated treatment well Patient left: in chair;with call bell/phone within reach;with chair alarm set Nurse Communication: Mobility status PT Visit Diagnosis: Other abnormalities of gait and mobility (R26.89);Muscle weakness (generalized) (M62.81);Other symptoms and signs involving the nervous system (R29.898);Difficulty in walking, not elsewhere classified (R26.2)     Time: 1025-1050 PT Time Calculation (min) (ACUTE ONLY): 25 min  Charges:  $Therapeutic Activity: 8-22 mins                    Mabeline Caras, PT, DPT Acute Rehabilitation Services  Pager [phone] Office [phone]  Derry Lory 06/06/2018, 12:09 PM

## 2018-06-06 NOTE — Evaluation (Addendum)
Clinical/Bedside Swallow Evaluation Patient Details  Name: Tamara Silva MRN: [id number] Date of Birth: 13-Apr-1961  Today's Date: 06/06/2018 Time: SLP Start Time (ACUTE ONLY): 0951 SLP Stop Time (ACUTE ONLY): 1005 SLP Time Calculation (min) (ACUTE ONLY): 14 min  Past Medical History:  Past Medical History:  Diagnosis Date  . Asthma   . Chronic combined systolic (congestive) and diastolic (congestive) heart failure ([place])   . Chronic iron deficiency anemia   . Diabetes mellitus without complication ([place])   . Hypertension    Past Surgical History:  Past Surgical History:  Procedure Laterality Date  . COLONOSCOPY WITH PROPOFOL N/A 08/03/2013   Procedure: COLONOSCOPY WITH PROPOFOL;  Surgeon: Tamara Columbia, MD;  Location: [facility] ENDOSCOPY;  Service: Endoscopy;  Laterality: N/A;  . CORONARY ANGIOPLASTY WITH STENT PLACEMENT     CAD in 2006 x 2 and 2009 2 more- place din REx in [place] [facility] [place] [facility]  . CORONARY ANGIOPLASTY WITH STENT PLACEMENT  10/07/2013   Xience [place] DES 2.75  mm x 15  mm  . ESOPHAGOGASTRODUODENOSCOPY (EGD) WITH PROPOFOL N/A 08/03/2013   Procedure: ESOPHAGOGASTRODUODENOSCOPY (EGD) WITH PROPOFOL;  Surgeon: Tamara Columbia, MD;  Location: [facility] ENDOSCOPY;  Service: Endoscopy;  Laterality: N/A;  . LEFT HEART CATHETERIZATION WITH CORONARY ANGIOGRAM N/A 10/07/2013   Procedure: LEFT HEART CATHETERIZATION WITH CORONARY ANGIOGRAM;  Surgeon: Tamara Man, MD;  Location: [facility] CATH LAB;  Service: Cardiovascular;  Laterality: N/A;   HPI:  58 y.o. female with medical history significant for chronic iron deficiency anemia with baseline hemoglobin 8-9, type 2 diabetes mellitus, chronic combined systolic/diastolic heart failure, stage III chronic kidney disease with baseline creatinine of 1.2, who is admitted for observation to [facility] long hospital on 05/29/2018 with acute on chronic iron deficiency anemia after presenting from home to [facility] long emergency department complaining of generalized weakness.   Dx acute hypoxic resp failure, acute on chronic heart failure, anemia, encephalopathy.  BSE 05/31/18 showec normal oropharyngeal function wtih recommendation for regular/thin and no f/u. Head CT 1/1 revealed Moderate, acute to subacute cortex infarct at the right occipitoparietal convexity. 2. Remote lacunar infarct in the right caudate. On 1/5 rapid response called due to seizure, intubated 1/6 and extubated later same day on 1/6.   Assessment / Plan / Recommendation Clinical Impression  Pt's vocal quality and intensity is adequate and pt denied prior dysphagia. Consumption of multiple trials thin liquids was remarkable for faint throat clear x 1 (cup/straw) and unremarkable with puree and solids. Stroke diagnosis increases aspiration risk; intubation short term and less than 24 hours. Able to follow commands with awareness of surroundings. Upper denture plate ill fitting and she will request family to bring adhesive cream. Order written for Dys 3 (mech soft) texture, thin liquids, straws allowed with pills in puree. ST to follow with recommendations.  Following for cognition as well. SLP Visit Diagnosis: Dysphagia, unspecified (R13.10)    Aspiration Risk  Mild aspiration risk    Diet Recommendation Dysphagia 3 (Mech soft);Thin liquid   Liquid Administration via: Straw;Cup Medication Administration: Whole meds with puree Supervision: Patient able to self feed;Intermittent supervision to cue for compensatory strategies Compensations: Slow rate;Small sips/bites Postural Changes: Seated upright at 90 degrees    Other  Recommendations Oral Care Recommendations: Oral care BID   Follow up Recommendations None      Frequency and Duration min 1 x/week  2 weeks       Prognosis Prognosis for Safe Diet Advancement: Good      Swallow Study  General HPI: 58 y.o. female with medical history significant for chronic iron deficiency anemia with baseline hemoglobin 8-9, type 2 diabetes mellitus,  chronic combined systolic/diastolic heart failure, stage III chronic kidney disease with baseline creatinine of 1.2, who is admitted for observation to [facility] long hospital on 05/29/2018 with acute on chronic iron deficiency anemia after presenting from home to [facility] long emergency department complaining of generalized weakness.  Dx acute hypoxic resp failure, acute on chronic heart failure, anemia, encephalopathy.  BSE 05/31/18 showec normal oropharyngeal function wtih recommendation for regular/thin and no f/u. Head CT 1/1 revealed Moderate, acute to subacute cortex infarct at the right occipitoparietal convexity. 2. Remote lacunar infarct in the right caudate. On 1/5 rapid response called due to seizure, intubated 1/6 and extubated later same day on 1/6. Type of Study: Bedside Swallow Evaluation Previous Swallow Assessment: (see HPI) Diet Prior to this Study: NPO Temperature Spikes Noted: No Respiratory Status: Nasal cannula History of Recent Intubation: Yes Length of Intubations (days): (less than 24 hrs) Date extubated: 06/05/18 Behavior/Cognition: Alert;Cooperative;Pleasant mood;Requires cueing Oral Cavity Assessment: Within Functional Limits Oral Care Completed by SLP: Yes Oral Cavity - Dentition: Dentures, top(missing some lower) Vision: (? will assess furhter) Self-Feeding Abilities: Able to feed self;Needs set up;Needs assist Patient Positioning: Upright in bed Baseline Vocal Quality: Normal Volitional Cough: Strong Volitional Swallow: Able to elicit    Oral/Motor/Sensory Function Overall Oral Motor/Sensory Function: Within functional limits   Ice Chips Ice chips: Not tested   Thin Liquid Thin Liquid: Impaired Presentation: Cup;Straw Pharyngeal  Phase Impairments: Throat Clearing - Delayed    Nectar Thick Nectar Thick Liquid: Not tested   Honey Thick Honey Thick Liquid: Not tested   Puree Puree: Within functional limits   Solid     Solid: Impaired      Tamara Silva 06/06/2018,1:28 PM  Tamara Pyo Tiro.Ed Risk analyst [phone] Office [phone]

## 2018-06-06 NOTE — Progress Notes (Signed)
Progress Note  Patient Name: Tamara Silva Date of Encounter: 06/06/2018  Primary Cardiologist: Loralie Champagne, MD   Subjective   Extubated; on Bipap; shakes head no to CP or dyspnea  Inpatient Medications    Scheduled Meds: . sodium chloride   Intravenous Once  . albuterol  2.5 mg Nebulization Q4H  . aspirin  81 mg Per NG tube Daily  . carvedilol  3.125 mg Oral BID WC  . chlorhexidine gluconate (MEDLINE KIT)  15 mL Mouth Rinse BID  . furosemide  80 mg Intravenous BID  . Gerhardt's butt cream   Topical TID  . insulin detemir  15 Units Subcutaneous Daily  . lactulose  300 mL Rectal BID  . mouth rinse  15 mL Mouth Rinse 10 times per day  . pantoprazole  40 mg Oral Q1200   Continuous Infusions: . sodium chloride 10 mL/hr at 06/06/18 0600  . dextrose 40 mL/hr at 06/06/18 0600  . levETIRAcetam Stopped (06/05/18 2306)  . propofol (DIPRIVAN) infusion Stopped (06/05/18 0837)   PRN Meds: sodium chloride, acetaminophen, albuterol, fentaNYL (SUBLIMAZE) injection, fentaNYL (SUBLIMAZE) injection, hydrALAZINE, midazolam, midazolam, ondansetron **OR** ondansetron (ZOFRAN) IV   Vital Signs    Vitals:   06/06/18 0400 06/06/18 0421 06/06/18 0500 06/06/18 0600  BP: 116/75  (!) 99/58 109/63  Pulse:      Resp: (!) 23  (!) 21 (!) 23  Temp:      TempSrc:      SpO2: 100% 100% 100% 97%  Weight:   (!) 147 kg   Height:        Intake/Output Summary (Last 24 hours) at 06/06/2018 0714 Last data filed at 06/06/2018 0600 Gross per 24 hour  Intake 613.89 ml  Output 1475 ml  Net -861.11 ml   Filed Weights   05/31/18 2050 06/05/18 0500 06/06/18 0500  Weight: (!) 149.7 kg (!) 148.5 kg (!) 147 kg    Telemetry    Sinus with PVCs- Personally Reviewed  Physical Exam   GEN: On Bipap; morbidly obese Neck: supple Cardiac: RRR, distant heart sounds Respiratory: CTA anteriorly; no wheeze GI: Soft, obese, no masses palpated MS: 1 + edema in hips  Neuro:  Follows commands  Labs      Chemistry Recent Labs  Lab 06/04/18 [phone] 06/05/18 0129 06/05/18 0602  NA 140 136 140  K 4.9 6.2* 4.1  CL 93* 91* 93*  CO2 42* 34* 41*  GLUCOSE 88 134* 86  BUN 21* 21* 19  CREATININE 1.02* 1.10* 1.04*  CALCIUM 8.4* 8.3* 8.6*  GFRNONAA >60 55* 59*  GFRAA >60 >60 >60  ANIONGAP 5 11 6      Hematology Recent Labs  Lab 06/03/18 0653 06/04/18 0824 06/05/18 0129  WBC 6.8 8.1 9.1  RBC 3.72* 3.96 4.10  HGB 7.8* 7.9* 8.4*  HCT 30.3* 32.7* 34.5*  MCV 81.5 82.6 84.1  MCH 21.0* 19.9* 20.5*  MCHC 25.7* 24.2* 24.3*  RDW 29.0* 30.0* 31.5*  PLT 447* 430* 402*    Cardiac Enzymes Recent Labs  Lab 05/30/18 0754 05/30/18 1300  TROPONINI <0.03 <0.03     Radiology    Ct Head Wo Contrast  Result Date: 06/04/2018 CLINICAL DATA:  Followup right MCA territory strokes. Altered mental status. EXAM: CT HEAD WITHOUT CONTRAST TECHNIQUE: Contiguous axial images were obtained from the base of the skull through the vertex without intravenous contrast. COMPARISON:  Previous CT and MR examinations 05/31/2018 and 06/01/2018 FINDINGS: Brain: The brainstem and cerebellum appear normal. Left hemisphere appears normal. Old  lacunar infarction in the right caudate as seen previously. Low-density in the right parieto-occipital cortex and the right parietal cortical and subcortical brain as seen on previous MR consistent with recent infarction. No evidence of extension. No evidence of hemorrhage. No significant mass effect. No extra-axial collection. No hydrocephalus. Vascular: There is atherosclerotic calcification of the major vessels at the base of the brain. Skull: Negative Sinuses/Orbits: Clear sinuses. Orbits negative except for an old medial wall blowout fracture on the left. Other: None IMPRESSION: Low-density in the right parieto-occipital cortex and right parietal cortical and subcortical brain consistent with recent infarction. No evidence of extension, hemorrhage or significant mass effect.  Electronically Signed   By: Nelson Chimes M.D.   On: 06/04/2018 11:57   Dg Chest Port 1 View  Result Date: 06/05/2018 CLINICAL DATA:  Respiratory failure, post intubation EXAM: PORTABLE CHEST 1 VIEW COMPARISON:  Portable exam 2359 hours compared to 05/30/2018 FINDINGS: Endotracheal tube tip projects 4.8 cm above carina. Enlargement of cardiac silhouette. Mediastinal contours normal. Improved RIGHT lung infiltrates though mild residual seen at both the RIGHT upper lobe and at the RIGHT base. Scarring at the lateral LEFT hemithorax unchanged. No gross pleural effusion or pneumothorax. IMPRESSION: Enlargement of cardiac silhouette. LEFT lung scarring. Improved RIGHT lung infiltrates. Electronically Signed   By: Lavonia Dana M.D.   On: 06/05/2018 00:10   Dg Abd Portable 1v  Result Date: 06/05/2018 CLINICAL DATA:  OG tube placement. EXAM: PORTABLE ABDOMEN - 1 VIEW COMPARISON:  CT 05/29/2018 FINDINGS: Tip and side port of the enteric tube below the diaphragm in the stomach. Nonobstructive bowel gas pattern in the upper abdomen. Soft tissue attenuation from habitus limits detailed assessment. IMPRESSION: Tip and side port of the enteric tube below the diaphragm in the stomach. Electronically Signed   By: Keith Rake M.D.   On: 06/05/2018 03:48   Ct Head Wo Contrast (charm Study)  Result Date: 06/05/2018 CLINICAL DATA:  Encephalopathy. Recent right MCA infarct. EXAM: CT HEAD WITHOUT CONTRAST TECHNIQUE: Contiguous axial images were obtained from the base of the skull through the vertex without intravenous contrast. COMPARISON:  Most recent head CT yesterday at 1148 hour, brain MRI 06/01/2018 FINDINGS: Brain: Progressive cortical low-density in the right parietooccipital lobe at site of infarct on MRI. Additional developing low-density in the posterior right frontal and right parietal lobes at site of infarcts on MRI. No evidence of hemorrhagic conversion or suspicious mass effect. Remote lacunar infarct in the  right caudate. No hydrocephalus or midline shift. No evidence of acute ischemia, mild motion artifact limitations. Vascular: Atherosclerosis of skullbase vasculature without hyperdense vessel or abnormal calcification. Skull: No fracture or focal lesion. Sinuses/Orbits: No opacification of ethmoid air cells with mucosal thickening may be secondary to intubation. Small left mastoid effusion, unchanged. Other: None. IMPRESSION: 1. Expected evolution of recent parietooccipital and frontoparietal infarcts seen on MRI. No hemorrhagic conversion or significant mass effect. 2. No new abnormality. Electronically Signed   By: Keith Rake M.D.   On: 06/05/2018 01:31    Patient Profile     58 y.o. female hx of COPD on home 02, DM-2, CAD with PCI at [facility] 2006 and [place] in 2008 likely to the LAD, STEMI inf wall with DES to RCA 2015 HTN, HLD, h/o of chronic LLL PE, chromic anemia, and chronic systolic HF for evaluation of acute on chronic CHF at the request of Dr. Tyrell Antonio.  Patient admitted December 30 with severe symptomatic anemia.  Developed volume excess following transfusion.  Hospital course has been complicated by encephalopathy and CVA.  Echocardiogram this admission shows ejection fraction 40 to 45%, akinesis of the distal anteroseptal and apical walls, moderate diastolic dysfunction, moderate right ventricular enlargement and severely elevated pulmonary pressure.  Assessment & Plan    1 Acute on chronic combined systolic/diastolic congestive heart failure-remains volume overloaded on examination.  Continue present dose of Lasix.  Follow renal function.  2 respiratory failure-improved.  Patient now extubated and on BiPAP.  Respiratory failure likely secondary to obesity hypoventilation syndrome, sleep apnea and COPD.   3 recent CVA-continue aspirin.  Resume statin when taking oral medications.  4 acute on chronic iron deficiency anemia-Per primary service.  5 CAD-continue ASA; resume statin when  tolerating oral meds.  For questions or updates, please contact [place] Please consult [email or web address] for contact info under        Signed, Kirk Ruths, MD  06/06/2018, 7:14 AM

## 2018-06-06 NOTE — Progress Notes (Signed)
Rehab Admissions Coordinator Note:  Patient was screened by Cleatrice Burke for appropriateness for an Inpatient Acute Rehab Consult per PT, and OT recommendations. At this time, we are recommending Inpatient Rehab consult. Please place order for consult.  Cleatrice Burke RN MSN 06/06/2018, 2:09 PM  I can be reached at [phone].

## 2018-06-07 ENCOUNTER — Encounter (HOSPITAL_COMMUNITY): Payer: Self-pay | Admitting: Physical Medicine and Rehabilitation

## 2018-06-07 DIAGNOSIS — E669 Obesity, unspecified: Secondary | ICD-10-CM

## 2018-06-07 DIAGNOSIS — E1169 Type 2 diabetes mellitus with other specified complication: Secondary | ICD-10-CM

## 2018-06-07 DIAGNOSIS — F1410 Cocaine abuse, uncomplicated: Secondary | ICD-10-CM

## 2018-06-07 LAB — GLUCOSE, CAPILLARY
Glucose-Capillary: 229 mg/dL — ABNORMAL HIGH (ref 70–99)
Glucose-Capillary: 225 mg/dL — ABNORMAL HIGH (ref 70–99)
Glucose-Capillary: 183 mg/dL — ABNORMAL HIGH (ref 70–99)
Glucose-Capillary: 154 mg/dL — ABNORMAL HIGH (ref 70–99)
Glucose-Capillary: 201 mg/dL — ABNORMAL HIGH (ref 70–99)
Glucose-Capillary: 159 mg/dL — ABNORMAL HIGH (ref 70–99)
Glucose-Capillary: 160 mg/dL — ABNORMAL HIGH (ref 70–99)

## 2018-06-07 MED ORDER — INSULIN ASPART 100 UNIT/ML ~~LOC~~ SOLN
0.0000 [IU] | Freq: Three times a day (TID) | SUBCUTANEOUS | Status: DC
  Administered 2018-06-07 (×2): 2 [IU] via SUBCUTANEOUS
  Administered 2018-06-07: 3 [IU] via SUBCUTANEOUS
  Administered 2018-06-08: 2 [IU] via SUBCUTANEOUS
  Administered 2018-06-08 (×2): 3 [IU] via SUBCUTANEOUS
  Administered 2018-06-09: 2 [IU] via SUBCUTANEOUS

## 2018-06-07 MED ORDER — ROSUVASTATIN CALCIUM 20 MG PO TABS
40.0000 mg | ORAL_TABLET | Freq: Every day | ORAL | Status: DC
  Administered 2018-06-07 – 2018-06-08 (×2): 40 mg via ORAL
  Filled 2018-06-07 (×2): qty 2

## 2018-06-07 MED ORDER — HEPARIN SODIUM (PORCINE) 5000 UNIT/ML IJ SOLN
5000.0000 [IU] | Freq: Three times a day (TID) | INTRAMUSCULAR | Status: DC
  Administered 2018-06-07 – 2018-06-09 (×6): 5000 [IU] via SUBCUTANEOUS
  Filled 2018-06-07 (×5): qty 1

## 2018-06-07 NOTE — Progress Notes (Signed)
Physical Therapy Treatment Patient Details Name: Tamara Silva MRN: [id number] DOB: 06-28-1960 Today's Date: 06/07/2018    History of Present Illness Pt is a 58 y.o. female admitted to [facility] on 05/29/18 with severe symptomatic anemia, volume overload and respiratory failure. Hospital course complicated by encephalopathy and speech disturbance. MRI noted acute infarct in R parietal lobe, R MCA/PCA watershed area; chronic lacunar infarct R caudate nucleus. Pt transferred to [facility] for stroke workup. ECG shows no seizure activity. Code blue called 1/6 due to seizure and unresponsiveness; ETT 1/6-1/7. Head CT 1/6 showed expected evolution of recent parietooccipital and frontoparietal infarcts, no hemorrhagic conversion or significant mass. PMH includes asthma, HF, anemia, DM, HTN.   PT Comments    Pt progressing with mobility. Able to transfer and ambulate short distance with RW and min-modA; pt demonstrates generalized weakness, fatigue and decreased activity tolerance. Pt's mobility is significantly limited by impaired cognitive status; pt with poor attention and problem solving, requiring frequent cues for task. Also with poor safety awareness. At high risk for falls. Continue to recommend intensive CIR-level therapies.  SpO2 down to 78% on 2L O2 [place] (pt's baseline is 2L), returning to >88% on 4L O2 [place] with rest and deep breathing.     Follow Up Recommendations  CIR;Supervision/Assistance - 24 hour     Equipment Recommendations  (TBD)    Recommendations for Other Services       Precautions / Restrictions Precautions Precautions: Fall Precaution Comments: uses 2L O2 at baseline Restrictions Weight Bearing Restrictions: No    Mobility  Bed Mobility Overal bed mobility: Needs Assistance Bed Mobility: Supine to Sit     Supine to sit: Min guard;HOB elevated     General bed mobility comments: Increased time and effort; cues to attend to task throughout as pt easily distracted    Transfers Overall transfer level: Needs assistance Equipment used: 2 person hand held assist;Rolling walker (2 wheeled) Transfers: Sit to/from [facility] Sit to Stand: Mod assist;+2 physical assistance;Min guard Stand pivot transfers: +2 physical assistance;Min assist       General transfer comment: Initially stood from bed with bilateral HHA and modA+2; stood from [facility] with min guard, heavy reliance on BUE support to push into standing  Ambulation/Gait Ambulation/Gait assistance: Min assist;+2 safety/equipment;Mod assist Gait Distance (Feet): 12 Feet Assistive device: Rolling walker (2 wheeled)   Gait velocity: Decreased Gait velocity interpretation: <1.31 ft/sec, indicative of household ambulator General Gait Details: Slow, unsteady amb with RW and minA+2 for balance and chair follow; pt with very poor attention, intermittent self-corrected knee buckling, and abrupt attempt to sit requiring assist+3 for safety and modA for descent to recliner. SpO2 down to 78% on 2L O2 , returning to >88% on 4L O2 with rest and deep breathing   Stairs             Wheelchair Mobility    Modified Rankin (Stroke Patients Only)       Balance Overall balance assessment: Needs assistance Sitting-balance support: Feet supported Sitting balance-Leahy Scale: Fair Sitting balance - Comments: able to maintain static sitting with min guard assist.  Pt somewhat impulsive so close min guard assist required    Standing balance support: Bilateral upper extremity supported Standing balance-Leahy Scale: Poor Standing balance comment: requires min assist and B UE support in standing                            Cognition Arousal/Alertness: Awake/alert Behavior During Therapy:  WFL for tasks assessed/performed Overall Cognitive Status: Impaired/Different from baseline Area of Impairment: Attention;Memory;Following commands;Safety/judgement;Awareness;Problem solving                    Current Attention Level: Sustained;Focused Memory: Decreased short-term memory Following Commands: Follows one step commands with increased time Safety/Judgement: Decreased awareness of safety;Decreased awareness of deficits Awareness: Intellectual;Emergent Problem Solving: Slow processing;Decreased initiation;Difficulty sequencing;Requires verbal cues;Requires tactile cues General Comments: Sister present and reports pt speech seems slower than usual (sister reports she has not seen pt in a while, so not 100% what baseline). Pt with poor attention, difficulty dividing attention. Slow to initiate movement. Requires frequent cues for redirection. Cues for simple problem solving      Exercises      General Comments        Pertinent Vitals/Pain Pain Assessment: Faces Faces Pain Scale: Hurts a little bit Pain Location: pt says she is "uncomfortable" Pain Descriptors / Indicators: Discomfort Pain Intervention(s): Monitored during session;Limited activity within patient's tolerance    Home Living                      Prior Function            PT Goals (current goals can now be found in the care plan section) Acute Rehab PT Goals Patient Stated Goal: Interested in [place] PT Goal Formulation: With patient Time For Goal Achievement: 06/15/18 Potential to Achieve Goals: Fair Progress towards PT goals: Progressing toward goals    Frequency    Min 3X/week      PT Plan Current plan remains appropriate    Co-evaluation PT/OT/SLP Co-Evaluation/Treatment: Yes Reason for Co-Treatment: Necessary to address cognition/behavior during functional activity;For patient/therapist safety;To address functional/ADL transfers PT goals addressed during session: Mobility/safety with mobility;Balance;Proper use of DME OT goals addressed during session: ADL's and self-care      AM-PAC PT "6 Clicks" Mobility   Outcome Measure  Help needed turning from your back to  your side while in a flat bed without using bedrails?: A Little Help needed moving from lying on your back to sitting on the side of a flat bed without using bedrails?: A Little Help needed moving to and from a bed to a chair (including a wheelchair)?: A Little Help needed standing up from a chair using your arms (e.g., wheelchair or bedside chair)?: A Little Help needed to walk in hospital room?: A Lot Help needed climbing 3-5 steps with a railing? : Total 6 Click Score: 15    End of Session Equipment Utilized During Treatment: Gait belt;Oxygen Activity Tolerance: Patient tolerated treatment well Patient left: in chair;with call bell/phone within reach;with chair alarm set Nurse Communication: Mobility status PT Visit Diagnosis: Other abnormalities of gait and mobility (R26.89);Muscle weakness (generalized) (M62.81);Other symptoms and signs involving the nervous system (R29.898);Difficulty in walking, not elsewhere classified (R26.2)     Time: 4097-3532 PT Time Calculation (min) (ACUTE ONLY): 39 min  Charges:  $Gait Training: 8-22 mins $Therapeutic Activity: 8-22 mins                    Mabeline Caras, PT, DPT Acute Rehabilitation Services  Pager [phone] Office [place] 06/07/2018, 6:09 PM

## 2018-06-07 NOTE — Consult Note (Signed)
Physical Medicine and Rehabilitation Consult  Reason for Consult: Functional decline.  Referring Physician: Dr. Barth Kirks.    HPI: Tamara Silva is a 58 y.o. female with history of T2DM, asthma, COPD with chronic hypoxic respiratory failure-Oxygen dependent, OSA--noncompliant with CPAP, anemia, morbid obesity- BMI 50 who was [facility]18 with generalized weakness, lethargy, acute on chronic anemia with Hgb 5.7 and slurred speech.  History taken from chart review.  Family reported confusion for couple of days PTA. Ammonia level elevated at 94 and UDS positive for cocaine. She was treated with diuretics for acute on chronic biventricular HF, placed on  BIPAP and transfused with 2 units PRBC. Cardiology consulted for input and recommended holding ASA and parasugel till source of bleeding found.  CT head done 1/1 reviewed, showing right posterior brain infarct.  Due to lethargy and revealed acute to subacute right occipitoparietal infarct with small areas of cytotoxic edema and she was transferred to [facility] for management.   Neurology felt that stroke most likely cardioembolic but patient not good long term AC candidate due to chronic anemia and to add ASA back once H/H stable.   She was weaned to CPAP but has been noncompliant. She developed mental status changes on 1/5 and placed on BIPAP but was non-compliant with use. Later that day she developed progressive hypoxia with unresponsiveness and generalized seizure type activity requiring intubation. She was loaded with Keppra and follow up CT head without extension or hemorrhage.  She tolerated extubation to BIPAP 1/6 and started on dysphagia 3, thins. Acute on chronic CHF improving--lasix transitioned to demadex and respiratory failure felt to multifactorial due to OHS, sleep apnea and COPD. Therapy evaluations completed revealing delayed processing with impulsivity, balance deficits and difficult with mobility. CIR recommended due to  functional decline.   Review of Systems  Constitutional: Negative for chills, diaphoresis and fever.  HENT: Negative for hearing loss and tinnitus.   Eyes: Positive for blurred vision. Negative for double vision.  Respiratory: Positive for shortness of breath (with minimal activity).   Cardiovascular: Positive for leg swelling. Negative for chest pain and palpitations.  Gastrointestinal: Positive for abdominal pain, constipation and heartburn.  Genitourinary: Negative for dysuria and urgency.  Musculoskeletal: Negative for back pain, joint pain, myalgias and neck pain.  Skin: Negative for itching and rash.  Neurological: Positive for weakness. Negative for dizziness, tingling and headaches.  Psychiatric/Behavioral: The patient is not nervous/anxious.   All other systems reviewed and are negative.   Past Medical History:  Diagnosis Date  . Asthma   . CAD (coronary artery disease)   . Chronic combined systolic (congestive) and diastolic (congestive) heart failure ([place])   . Chronic iron deficiency anemia   . Diabetes mellitus without complication ([place])   . DOE (dyspnea on exertion)   . Edema of both legs   . Hypertension   . Pulmonary emboli ([place]) 2014    Past Surgical History:  Procedure Laterality Date  . COLONOSCOPY WITH PROPOFOL N/A 08/03/2013   Procedure: COLONOSCOPY WITH PROPOFOL;  Surgeon: Jeryl Columbia, MD;  Location: [facility] ENDOSCOPY;  Service: Endoscopy;  Laterality: N/A;  . CORONARY ANGIOPLASTY WITH STENT PLACEMENT     CAD in 2006 x 2 and 2009 2 more- place din REx in [place] and [place] [facility]  . CORONARY ANGIOPLASTY WITH STENT PLACEMENT  10/07/2013   Xience [place] DES 2.75  mm x 15  mm  . ESOPHAGOGASTRODUODENOSCOPY (EGD) WITH PROPOFOL N/A 08/03/2013   Procedure: ESOPHAGOGASTRODUODENOSCOPY (EGD)  WITH PROPOFOL;  Surgeon: Jeryl Columbia, MD;  Location: [facility] END[facility]COPY;  Service: Endoscopy;  Laterality: N/A;  . LEFT HEART CATHETERIZATION WITH CORONARY ANGIOGRAM N/A 10/07/2013   Procedure:  LEFT HEART CATHETERIZATION WITH CORONARY ANGIOGRAM;  Surgeon: Leonie Man, MD;  Location[facility] C[facility] LAB;  Service: Cardiovascular;  Laterality: N/A;    Family History  Problem Relation Age of Onset  . Stroke Mother   . Hypertension Mother   . Colon cancer Father   . Diabetes Father   . Heart attack Father   . Sarcoidosis Other   . Breast cancer Paternal Aunt   . Emphysema Brother   . Lung disease Brother        Unknown type, 3 brothers, one with liver and lung disease  . Asthma Paternal Aunt     Social History:  Lives with boyfriend who manages home. She has been sedentary with decline in activity tolerance for the past 6 months. She reports that she quit smoking about 4 years ago. Her smoking use included cigarettes. She has a 10.00 pack-year smoking history. She has never used smokeless tobacco. She reports that she does not drink alcohol or use drugs.    Allergies  Allergen Reactions  . Metolazone Other (See Comments)    Dizziness and falling  . Plavix [Clopidogrel Bisulfate] Other (See Comments)    High PRU's - non-responder  . Ibuprofen     Patient stated she can not take ibuprofen.   . Nsaids Other (See Comments)    "I do not take NSAIDs d/t interfering with other meds"    Medications Prior to Admission  Medication Sig Dispense Refill  . ACCU-CHEK AVIVA PLUS test strip USE 1 STRIP TO CHECK GLUCOSE 3 TIMES DAILY 100 each 0  . ACCU-CHEK FASTCLIX LANCETS MISC 1 applicator by Does not apply route 4 (four) times daily -  before meals and at bedtime. 100 each 11  . ACCU-CHEK SOFTCLIX LANCETS lancets USE 1 LANCET TO CHECK GLUCOSE 3 TIMES DAILY 100 each 10  . aspirin 81 MG tablet Take 1 tablet (81 mg total) by mouth daily.    . Blood Glucose Monitoring Suppl (ACCU-CHEK AVIVA) device Use as instructed 3 times daily before meals. 1 each 0  . carvedilol (COREG) 12.5 MG tablet TAKE 1 TABLET BY MOUTH 2 TIMES A DAY WITH A MEAL **PT NEEDS TO SCHEDULE AN APPT WITH OUR OFFICE AT  [phone] FOR FUTURE REFILLS 60 tablet 1  . fluticasone (FLONASE) 50 MCG/ACT nasal spray INSTILL 2 SPRAYS IN EACH NOSTRIL TWICE DAILY. 1 g 1  . furosemide (LASIX) 40 MG tablet Take 1.5 tablets (60 mg total) by mouth 2 (two) times daily. 90 tablet 0  . Insulin Pen Needle (TRUEPLUS PEN NEEDLES) 32G X 4 MM MISC Use three times daily to inject insulin 100 each 11  . INSULIN SYRINGE .5CC/29G 29G X 1/2" 0.5 ML MISC Use as instructed by physician 100 each 12  . ipratropium-albuterol (DUONEB) 0.5-2.5 (3) MG/3ML SOLN Use nebulizer to inhale contents of one vial every 6 hours as needed for shortness of breath, wheezing 360 mL 11  . LEVEMIR 100 UNIT/ML injection INJECT 45 UNITS SUBCUTANEOUSLY EVERY NIGHT AT BEDTIME *MUST HAVE OFFICE VISIT FOR REFILLS* 2 vial 0  . losartan (COZAAR) 50 MG tablet Take 1 tablet (50 mg total) by mouth daily. 90 tablet 1  . nitroGLYCERIN (NITROSTAT) 0.4 MG SL tablet Place 1 tablet (0.4 mg total) under the tongue every 5 (five) minutes as needed for chest  pain. 25 tablet 11  . NOVOLOG FLEXPEN 100 UNIT/ML FlexPen INJECT 15-20 UNITS SUBCUTANEOUSLY 3 TIMES DAILY WITH MEALS. **PATIENT MUST HAVE OFFICE VISIT FOR FUTURE REFILLS** 2 pen 0  . omeprazole (PRILOSEC) 40 MG capsule TAKE 1 CAPSULE BY MOUTH TWICE DAILY. 60 capsule 1  . polyethylene glycol (MIRALAX / GLYCOLAX) packet Take 17 g by mouth daily. 30 each 6  . prasugrel (EFFIENT) 10 MG TABS tablet TAKE ONE TABLET BY MOUTH DAILY. 30 tablet 0  . rosuvastatin (CRESTOR) 10 MG tablet Take 1 tablet (10 mg total) by mouth daily. 90 tablet 2  . spironolactone (ALDACTONE) 25 MG tablet Take 1 tablet (25 mg total) by mouth daily. 90 tablet 3  . iron polysaccharides (NIFEREX) 150 MG capsule Take 1 capsule (150 mg total) by mouth daily. (Patient not taking: Reported on 05/29/2018) 30 capsule 11  . prazosin (MINIPRESS) 5 MG capsule TAKE 1 CAPSULE BY MOUTH EVERY NIGHT AT BEDTIME FOR NIGHTMARES. (Patient not taking: Reported on 05/29/2018) 30 capsule  0  . PROAIR HFA 108 (90 Base) MCG/ACT inhaler INHALE 2 PUFFS BY MOUTH 4 TIMES DAILY AS NEEDED (Patient not taking: Reported on 05/29/2018) 8.5 each 2  . senna (SENOKOT) 8.6 MG TABS tablet Take 1 tablet (8.6 mg total) by mouth 2 (two) times daily. (Patient not taking: Reported on 05/29/2018) 120 each 0    Home: Home Living Family/patient expects to be discharged to:: Private residence Living Arrangements: Spouse/significant other(boyfriend per chart, per pt pt lives with roommate) Available Help at Discharge: Family, Friend(s) Type of Home: House Home Access: Stairs to enter Technical brewer of Steps: 2 Home Layout: One level Bathroom Shower/Tub: Chiropodist: Handicapped height Home Equipment: [place] - single point, Bedside commode Additional Comments: PLOF obtained via chart review from admission in 12/2017  Functional History: Prior Function Level of Independence: Needs assistance Gait / Transfers Assistance Needed: pt not able to state, pt was ambulating approx 10-20' using RW with PT and minguard assist (+2 safety) during recent admission in 12/2017 ADL's / Homemaking Assistance Needed: was receiving assist for bathing/dressing ADLs; per chart review pt's significant other assisted with bating at bed level Functional Status:  Mobility: Bed Mobility Overal bed mobility: Needs Assistance Bed Mobility: Supine to Sit Supine to sit: Min assist Sit to supine: Max assist, +2 for physical assistance, +2 for safety/equipment General bed mobility comments: Pt demonstrates difficulty intiating, sequencing and problem solving through how to move to EOB.  She initially turns on her Lt side, and then stops.  She requires cues and instruction to re-initiate and complete task  Transfers Overall transfer level: Needs assistance Equipment used: 2 person hand held assist Transfers: Sit to/from Stand Sit to Stand: Mod assist, +2 safety/equipment, +2 physical assistance Stand  pivot transfers: Mod assist, +2 physical assistance, +2 safety/equipment General transfer comment: cues to initiate activity and cues for hand placement.  She stands with assist to steady, and then step by step cues for pivot, and assist to maintain balance and control descent  Ambulation/Gait Ambulation/Gait assistance: Mod assist, +2 physical assistance, +2 safety/equipment Gait Distance (Feet): 1 Feet Assistive device: 2 person hand held assist Gait Pattern/deviations: Leaning posteriorly, Step-to pattern General Gait Details: Took pivotal steps from bed to recliner with modA+2 and bilateral HHA to prevent posterior LOB; step by step cues and to prevent premature sitting Gait velocity: Decreased    ADL: ADL Overall ADL's : Needs assistance/impaired Eating/Feeding: Minimal assistance, Sitting Eating/Feeding Details (indicate cue type and reason): pt request  applesauce, but when it is handed to her, she passively stares at it, and at times, refuses to self feed.  Cues provided to perform on her own.  Once she gets started, she is able to continue task with supervision  Grooming: Wash/dry hands, Minimal assistance, Sitting Grooming Details (indicate cue type and reason): assist to initiate and for thoroughness  Upper Body Bathing: Maximal assistance, Sitting Lower Body Bathing: Maximal assistance, Sit to/from stand Upper Body Dressing : Maximal assistance, Sitting Upper Body Dressing Details (indicate cue type and reason): requires increased assist due to poor sitting balance Lower Body Dressing: Total assistance, Sit to/from stand Toilet Transfer: Moderate assistance, BSC Toileting- Clothing Manipulation and Hygiene: Total assistance, Sit to/from stand Functional mobility during ADLs: Moderate assistance, +2 for physical assistance, +2 for safety/equipment General ADL Comments: pt with decreased balance, endurance, impaired cognition and suspect impaired  vision  Cognition: Cognition Overall Cognitive Status: Impaired/Different from baseline Arousal/Alertness: Awake/alert Orientation Level: Oriented X4 Attention: Focused Focused Attention: Impaired Focused Attention Impairment: Verbal basic, Functional basic Memory: Impaired Memory Impairment: Decreased recall of new information Awareness: Appears intact Problem Solving: Impaired Problem Solving Impairment: Verbal basic Executive Function: Organizing, Self Monitoring, Self Correcting Organizing: Impaired Organizing Impairment: Verbal basic Self Monitoring: Impaired Self Monitoring Impairment: Verbal basic Self Correcting: Impaired Self Correcting Impairment: Verbal basic Behaviors: Perseveration, Confabulation Safety/Judgment: Impaired Cognition Arousal/Alertness: Awake/alert Behavior During Therapy: Flat affect Overall Cognitive Status: Impaired/Different from baseline Area of Impairment: Attention, Memory, Following commands, Safety/judgement, Awareness, Problem solving Current Attention Level: Sustained, Focused Memory: Decreased short-term memory Following Commands: Follows one step commands consistently, Follows one step commands with increased time Safety/Judgement: Decreased awareness of safety, Decreased awareness of deficits Awareness: Emergent Problem Solving: Slow processing, Decreased initiation, Difficulty sequencing, Requires verbal cues, Requires tactile cues General Comments: Pt demonstrates focused to sustained attention, and is unable to shift attention.  She is very slow to respond to questions and very slow to initiate.  She will, at times, fixate on something, and difficult to redirect her attention.   Mod cues for simple problem solving    Blood pressure 132/66, pulse 78, temperature 97.7 F (36.5 C), temperature source Oral, resp. rate 16, height 5' 7.5" (1.715 m), weight (!) 147 kg, last menstrual period 07/28/2013, SpO2 99 %. Physical Exam  Nursing  note and vitals reviewed. Constitutional: She is oriented to person, place, and time. She appears well-developed. No distress.  Morbidly obese.   HENT:  Head: Normocephalic and atraumatic.  Eyes: EOM are normal. Right eye exhibits no discharge. Left eye exhibits no discharge.  Neck: Normal range of motion. Neck supple.  Cardiovascular: Normal rate and regular rhythm.  Respiratory: Effort normal and breath sounds normal.  + [place]  GI: She exhibits distension. There is abdominal tenderness.  Musculoskeletal:        General: Edema present.  Neurological: She is alert and oriented to person, place, and time.  Speech slow and measured with dysarthria.  Delayed processing.  She was slow to initiate, distracted and perseverative at times but mentation improved as exam progressed.  Motor: Grossly 4-4+/5 throughout   Skin: She is not diaphoretic.  Dry flaky skin on abdomen. Skin folds with Interdry AG cloths.   Psychiatric: Her affect is blunt. Her speech is delayed. She is slowed.    Results for orders placed or performed during the hospital encounter of 05/29/18 (from the past 24 hour(s))  Glucose, capillary     Status: Abnormal   Collection Time: 06/06/18  3:20 PM  Result Value Ref Range   Glucose-Capillary 142 (H) 70 - 99 mg/dL  Glucose, capillary     Status: Abnormal   Collection Time: 06/06/18  7:39 PM  Result Value Ref Range   Glucose-Capillary 228 (H) 70 - 99 mg/dL  Glucose, capillary     Status: Abnormal   Collection Time: 06/07/18  1:05 AM  Result Value Ref Range   Glucose-Capillary 229 (H) 70 - 99 mg/dL  Glucose, capillary     Status: Abnormal   Collection Time: 06/07/18  1:48 AM  Result Value Ref Range   Glucose-Capillary 225 (H) 70 - 99 mg/dL  Glucose, capillary     Status: Abnormal   Collection Time: 06/07/18  4:21 AM  Result Value Ref Range   Glucose-Capillary 183 (H) 70 - 99 mg/dL  Glucose, capillary     Status: Abnormal   Collection Time: 06/07/18  7:35 AM  Result  Value Ref Range   Glucose-Capillary 154 (H) 70 - 99 mg/dL  Glucose, capillary     Status: Abnormal   Collection Time: 06/07/18 11:08 AM  Result Value Ref Range   Glucose-Capillary 201 (H) 70 - 99 mg/dL   No results found.  Assessment/Plan: Diagnosis: Debility Labs and images (see above) independently reviewed.  Records reviewed and summated above.  1. Does the need for close, 24 hr/day medical supervision in concert with the patient's rehab needs make it unreasonable for this patient to be served in a less intensive setting? Yes  2. Co-Morbidities requiring supervision/potential complications: dysphagia (advance diet as tolerated), seizures (continue meds), non-compliant (educate), ABLA (repeat labs, transfuse to ensure appropriate perfusion for increased activity tolerance), CHF (Monitor in accordance with increased physical activity and avoid UE resistance excercises), cocaine abuse (counsel), T2 DM (Monitor in accordance with exercise and adjust meds as necessary), asthma, COPD with chronic hypoxic respiratory failure-Oxygen dependent, OSA--noncompliant with CPAP, morbid obesity 3. Due to bladder management, bowel management, disease management, pain management and patient education, does the patient require 24 hr/day rehab nursing? Yes 4. Does the patient require coordinated care of a physician, rehab nurse, PT (1-2 hrs/day, 5 days/week), OT (1-2 hrs/day, 5 days/week) and SLP (1-2 hrs/day, 5 days/week) to address physical and functional deficits in the context of the above medical diagnosis(es)? Yes Addressing deficits in the following areas: balance, endurance, locomotion, strength, transferring, bathing, dressing, toileting, cognition and psychosocial support 5. Can the patient actively participate in an intensive therapy program of at least 3 hrs of therapy per day at least 5 days per week? Potentially 6. The potential for patient to make measurable gains while on inpatient rehab is  excellent 7. Anticipated functional outcomes upon discharge from inpatient rehab are supervision  with PT, supervision with OT, modified independent and supervision with SLP. 8. Estimated rehab length of stay to reach the above functional goals is: 16-19 days. 9. Anticipated D/C setting: Home 10. Anticipated post D/C treatments: HH therapy and Home excercise program 11. Overall Rehab/Functional Prognosis: excellent and good  RECOMMENDATIONS: This patient's condition is appropriate for continued rehabilitative care in the following setting: CIR caregiver support available upon discharge and patient willing to participate in 3 hours of therapy per day. Patient has agreed to participate in recommended program. Potentially Note that insurance prior authorization may be required for reimbursement for recommended care.  Comment: Rehab Admissions Coordinator to follow up.   I have personally performed a face to face diagnostic evaluation, including, but not limited to relevant history and physical exam findings, of this patient  and developed relevant assessment and plan.  Additionally, I have reviewed and concur with the physician assistant's documentation above.   Delice Lesch, MD, ABPMR Bary Leriche, PA-C 06/07/2018

## 2018-06-07 NOTE — Progress Notes (Addendum)
Progress Note  Patient Name: Tamara Silva Date of Encounter: 06/07/2018  Primary Cardiologist: Loralie Champagne, MD   Subjective   A and O; denies CP or dyspnea  Inpatient Medications    Scheduled Meds: . albuterol  2.5 mg Nebulization Q4H  . aspirin  81 mg Per NG tube Daily  . carvedilol  3.125 mg Oral BID WC  . chlorhexidine gluconate (MEDLINE KIT)  15 mL Mouth Rinse BID  . Gerhardt's butt cream   Topical TID  . insulin aspart  0-9 Units Subcutaneous TID WC  . insulin detemir  15 Units Subcutaneous Daily  . multivitamin  15 mL Oral Daily  . pantoprazole  40 mg Oral Q1200  . torsemide  60 mg Oral Daily   Continuous Infusions: . sodium chloride Stopped (06/06/18 1807)  . [START ON 06/10/2018] ferumoxytol     PRN Meds: sodium chloride, acetaminophen, albuterol, hydrALAZINE, ondansetron **OR** ondansetron (ZOFRAN) IV   Vital Signs    Vitals:   06/07/18 0400 06/07/18 0500 06/07/18 0600 06/07/18 0737  BP: (!) 106/56 (!) 97/57 (!) 114/59   Pulse:      Resp: (!) 24 (!) 23 (!) 22   Temp: (!) 97.5 F (36.4 C)   97.6 F (36.4 C)  TempSrc: Axillary   Oral  SpO2: 100% 99% 97%   Weight:  (!) 147 kg    Height:        Intake/Output Summary (Last 24 hours) at 06/07/2018 0741 Last data filed at 06/07/2018 0600 Gross per 24 hour  Intake 845.44 ml  Output 725 ml  Net 120.44 ml   Filed Weights   06/05/18 0500 06/06/18 0500 06/07/18 0500  Weight: (!) 148.5 kg (!) 147 kg (!) 147 kg    Telemetry    Sinus with PVCs- Personally Reviewed  Physical Exam   GEN: WD Morbidly obese Neck: supple; JVP difficult Cardiac: RRR Respiratory: CTA GI: Soft, obese, not tender MS: trace edema Neuro:  Grossly intact  Labs    Chemistry Recent Labs  Lab 06/05/18 0129 06/05/18 0602 06/06/18 1022  NA 136 140 141  K 6.2* 4.1 3.8  CL 91* 93* 93*  CO2 34* 41* 42*  GLUCOSE 134* 86 79  BUN 21* 19 16  CREATININE 1.10* 1.04* 0.94  CALCIUM 8.3* 8.6* 8.6*  PROT  --   --  8.8*  ALBUMIN   --   --  2.5*  AST  --   --  25  ALT  --   --  14  ALKPHOS  --   --  67  BILITOT  --   --  1.5*  GFRNONAA 55* 59* >60  GFRAA >60 >60 >60  ANIONGAP 11 6 6      Hematology Recent Labs  Lab 06/04/18 0824 06/05/18 0129 06/06/18 1022  WBC 8.1 9.1 9.0  RBC 3.96 4.10 4.45  HGB 7.9* 8.4* 9.0*  HCT 32.7* 34.5* 36.2  MCV 82.6 84.1 81.3  MCH 19.9* 20.5* 20.2*  MCHC 24.2* 24.3* 24.9*  RDW 30.0* 31.5* 31.6*  PLT 430* 402* 381    Patient Profile     58 y.o. female hx of COPD on home 02, DM-2, CAD with [facility] 2006 and [facility] in 2008 likely to the LAD, STEMI inf wall with DES to RCA 2015 HTN, HLD, h/o of chronic LLL PE, chromic anemia, and chronic systolic HF for evaluation of acute on chronic CHF at the request of Dr. Tyrell Antonio.  Patient admitted December 30 with severe symptomatic  anemia.  Developed volume excess following transfusion.  Hospital course has been complicated by encephalopathy and CVA.  Echocardiogram this admission shows ejection fraction 40 to 45%, akinesis of the distal anteroseptal and apical walls, moderate diastolic dysfunction, moderate right ventricular enlargement and severely elevated pulmonary pressure.  Assessment & Plan    1 Acute on chronic combined systolic/diastolic congestive heart failure-patient appears to be euvolemic this morning.  Agree with Demadex.  Follow potassium and renal function.  Continue low-dose Coreg.  Add ARB later if blood pressure allows.  2 respiratory failure-improved. Respiratory failure likely secondary to obesity hypoventilation syndrome, sleep apnea and COPD.   3 recent CVA-continue aspirin.  Will resume statin.  Begin Crestor 40 mg daily.  She will need lipids and liver checked in 8 weeks.  4 acute on chronic iron deficiency anemia-Per primary service.  5 CAD-continue ASA; as above will resume statin.  [facility] will sign off.   Medication Recommendations: Would continue present medications at discharge assuming renal  function remained stable on new dose of Demadex.  Lasix has been replaced with Demadex. Other recommendations (labs, testing, etc): Check potassium and renal function 1 week following discharge. Follow up as an outpatient: Follow-up Dr. Aundra Dubin 4 to 6 weeks following discharge.  For questions or updates, please contact [place] Please consult [email or web address] for contact info under        Signed, Kirk Ruths, MD  06/07/2018, 7:41 AM

## 2018-06-07 NOTE — Progress Notes (Signed)
  Speech Language Pathology Treatment: Dysphagia;Cognitive-Linquistic  Patient Details Name: Tamara Silva MRN: [id number] DOB: Mar 22, 1961 Today's Date: 06/07/2018 Time: 4332-9518 SLP Time Calculation (min) (ACUTE ONLY): 19 min  Assessment / Plan / Recommendation Clinical Impression  Pt consumed regular textures and thin liquids with delayed coughing that was not observed until several minutes after PO intake had stopped. Pt endorses a h/o reflux, which may be contributing, as prior to this, she had clinical signs to suggest dysphagia or aspiration. She also has her adhesive today, and her upper dentures appear to be fitting well. Would continue current diet for now.  Pt remains very distractible, with word-finding errors in conversation likely related to decreased thought organization, as she has mild improvement as SLP modified her environment to reduce distractions. She is oriented to person, place, and time, but does not recall daily events. Pt would benefit from CIR to maximize cognitive and communicative abilities.   HPI HPI: 58 y.o. female with medical history significant for chronic iron deficiency anemia with baseline hemoglobin 8-9, type 2 diabetes mellitus, chronic combined systolic/diastolic heart failure, stage III chronic kidney disease with baseline creatinine of 1.2, who is admitted for observation to [facility] long hospital on 05/29/2018 with acute on chronic iron deficiency anemia after presenting from home to [facility] long emergency department complaining of generalized weakness.  Dx acute hypoxic resp failure, acute on chronic heart failure, anemia, encephalopathy.  BSE 05/31/18 showec normal oropharyngeal function wtih recommendation for regular/thin and no f/u. Head CT 1/1 revealed Moderate, acute to subacute cortex infarct at the right occipitoparietal convexity. 2. Remote lacunar infarct in the right caudate. On 1/5 rapid response called due to seizure, intubated 1/6 and extubated later  same day on 1/6.      SLP Plan  Continue with current plan of care       Recommendations  Diet recommendations: Dysphagia 3 (mechanical soft);Thin liquid Liquids provided via: Cup;Straw Medication Administration: Whole meds with puree Supervision: Patient able to self feed;Intermittent supervision to cue for compensatory strategies Compensations: Slow rate;Small sips/bites Postural Changes and/or Swallow Maneuvers: Seated upright 90 degrees;Upright 30-60 min after meal                Oral Care Recommendations: Oral care BID Follow up Recommendations: Inpatient Rehab SLP Visit Diagnosis: Dysphagia, unspecified (R13.10);Cognitive communication deficit (A41.660) Plan: Continue with current plan of care       GO                Germain Osgood 06/07/2018, 1:53 PM  Germain Osgood, M.A. [place] Acute Environmental education officer [phone] Office [phone]

## 2018-06-07 NOTE — Progress Notes (Signed)
Occupational Therapy Treatment Patient Details Name: Tamara Silva MRN: [id number] DOB: [birth year] Today's Date: 06/07/2018    History of present illness Pt is a 58 y.o. female admitted to [facility] on 05/29/18 with severe symptomatic anemia, volume overload and respiratory failure. Hospital course complicated by encephalopathy and speech disturbance. MRI noted acute infarct in R parietal lobe, R MCA/PCA watershed area; chronic lacunar infarct R caudate nucleus. Pt transferred to [facility] for stroke workup. ECG shows no seizure activity. Code blue called 1/6 due to seizure and unresponsiveness; ETT 1/6-1/7. Head CT 1/6 showed expected evolution of recent parietooccipital and frontoparietal infarcts, no hemorrhagic conversion or significant mass. PMH includes asthma, HF, anemia, DM, HTN.   OT comments  Pt with slow mentation and poor attention requiring maximum verbal cues to initiate and/or continue activity. No physical assist needed for bed mobility this date. Stood from bed with 2 person assist, but from [facility] with min guard and RW. Pt able to groom with set up. Close follow with chair necessary during short distance ambulation due to fatigue and pt sitting abruptly. Pt with desat to 78% on 2L, but recovered rapidly to mid 90s on 4L.   Follow Up Recommendations  CIR;Supervision/Assistance - 24 hour    Equipment Recommendations  None recommended by OT    Recommendations for Other Services      Precautions / Restrictions Precautions Precautions: Fall Precaution Comments: uses 2L 02 at baseline Restrictions Weight Bearing Restrictions: No       Mobility Bed Mobility Overal bed mobility: Needs Assistance Bed Mobility: Supine to Sit     Supine to sit: Min guard;HOB elevated     General bed mobility comments: Increased time and effort; cues to attend to task throughout as pt easily distracted   Transfers Overall transfer level: Needs assistance Equipment used: Rolling walker (2 wheeled);2  person hand held assist Transfers: Sit to/from [facility] Sit to Stand: +2 physical assistance;Mod assist;Min guard Stand pivot transfers: +2 physical assistance;Min assist       General transfer comment: +2 mod from bed with hand held assist, min guard from [facility] to walker    Balance Overall balance assessment: Needs assistance Sitting-balance support: Feet supported Sitting balance-Leahy Scale: Fair       Standing balance-Leahy Scale: Poor Standing balance comment: requires min assist and B UE support in standing                           ADL either performed or assessed with clinical judgement   ADL Overall ADL's : Needs assistance/impaired     Grooming: Wash/dry face;Sitting;Set up           Upper Body Dressing : Moderate assistance;Sitting       Toilet Transfer: +2 for physical assistance;Minimal assistance;Stand-pivot;BSC   Toileting- Clothing Manipulation and Hygiene: Total assistance;Sit to/from stand       Functional mobility during ADLs: +2 for safety/equipment;Minimal assistance;Rolling walker General ADL Comments: pt sits abruptly, fatigues easily     Vision       Perception     Praxis      Cognition Arousal/Alertness: Awake/alert Behavior During Therapy: (sluggish) Overall Cognitive Status: Impaired/Different from baseline Area of Impairment: Attention;Memory;Following commands;Safety/judgement;Awareness;Problem solving                   Current Attention Level: Sustained;Focused Memory: Decreased short-term memory Following Commands: Follows one step commands with increased time Safety/Judgement: Decreased awareness of safety;Decreased awareness of deficits Awareness:  Intellectual;Emergent Problem Solving: Slow processing;Decreased initiation;Difficulty sequencing;Requires verbal cues;Requires tactile cues General Comments: Sister present and reports pt speech seems slower than usual (sister reports she  has not seen pt in a while, so not 100% what baseline). Pt with poor attention, difficulty dividing attention. Slow to initiate movement. Requires frequent cues for redirection. Cues for simple problem solving        Exercises     Shoulder Instructions       General Comments      Pertinent Vitals/ Pain       Pain Assessment: Faces Faces Pain Scale: Hurts a little bit Pain Location: pt says she is "uncomfortable" Pain Descriptors / Indicators: Discomfort Pain Intervention(s): Monitored during session;Repositioned  Home Living                                          Prior Functioning/Environment              Frequency  Min 2X/week        Progress Toward Goals  OT Goals(current goals can now be found in the care plan section)  Progress towards OT goals: Progressing toward goals  Acute Rehab OT Goals OT Goal Formulation: With patient Time For Goal Achievement: 06/15/18 Potential to Achieve Goals: Good  Plan Discharge plan remains appropriate    Co-evaluation    PT/OT/SLP Co-Evaluation/Treatment: Yes Reason for Co-Treatment: For patient/therapist safety;Necessary to address cognition/behavior during functional activity PT goals addressed during session: Mobility/safety with mobility;Balance;Proper use of DME OT goals addressed during session: ADL's and self-care      AM-PAC OT "6 Clicks" Daily Activity     Outcome Measure   Help from another person eating meals?: A Little Help from another person taking care of personal grooming?: A Little Help from another person toileting, which includes using toliet, bedpan, or urinal?: A Lot Help from another person bathing (including washing, rinsing, drying)?: A Lot Help from another person to put on and taking off regular upper body clothing?: A Lot Help from another person to put on and taking off regular lower body clothing?: A Lot 6 Click Score: 14    End of Session Equipment Utilized During  Treatment: Oxygen;Rolling walker  OT Visit Diagnosis: Unsteadiness on feet (R26.81);Cognitive communication deficit (R41.841) Symptoms and signs involving cognitive functions: Cerebral infarction   Activity Tolerance Patient tolerated treatment well   Patient Left in chair;with call bell/phone within reach;with chair alarm set   Nurse Communication Mobility status;Other (comment)(needs purewick replaced)        Time: 2800-3491 OT Time Calculation (min): 39 min  Charges: OT General Charges $OT Visit: 1 Visit OT Treatments $Self Care/Home Management : 8-22 mins  Nestor Lewandowsky, OTR/L Acute Rehabilitation Services Pager: [phone] Office: [phone]  Malka So 06/07/2018, 4:21 PM

## 2018-06-07 NOTE — Progress Notes (Signed)
PROGRESS NOTE    Tamara Silva  BTD:[id number] DOB: [birth year] DOA: 05/29/2018 PCP: Antony Blackbird, MD   Brief Narrative:  This is a 58 year old female, history of OSA, COPD, chronic hypoxic respiratory failure(4 L at home), iron deficiency anemia who was admitted to hospital on 12/30 for generalized weakness and anemia, slurred speech, and a found to have a right MCA/PCA watershed area stroke and a 2 cm infarct in the inferior right parietal lobe (felt to be incidental), with hypercapnic respiratory failure.  Placed on BiPAP but had been tolerating it well.  Patient became unresponsive, had a generalized tonic-clonic seizure, apnea, and pulselessness.  CODE BLUE was called.  Patient was intubated and transferred to ICU. Now extubated.  Significant Hospital Events   05/29/2018: Presented to [facility] long ER with complaints of generalized weakness, found to have hemoglobin 5.7. Acute on chronic iron deficiency anemia. Stroke evaluation revealed right MCA/PCA watershed and right parietal embolic stroke. Transferred to [facility]. 06/04/2018: Rapid response for altered mental status. 5 to 6-minute generalized tonic-clonic seizure. CODE BLUE for respiratory arrest. Possible postictal state. Transferred to ICU ([place]). -She is now extubated and on nightly BiPAP.  Transfered to stepdown.    Assessment & Plan:   Principal Problem:   Acute on chronic anemia Active Problems:   Essential hypertension   Chronic combined systolic and diastolic heart failure (HCC)   Uncontrolled type 2 diabetes mellitus without complication, with long-term current use of insulin (HCC)   Acute and chr resp failure, unsp w hypoxia or hypercapnia (HCC)   Congestive heart failure (CHF) (HCC)   COPD (chronic obstructive pulmonary disease) (HCC)   AKI (acute kidney injury) (HCC)   Pressure injury of skin   Cerebral embolism with cerebral infarction   Generalized tonic-clonic seizure ([place])   Acute on chronic hypoxic  and hypercapnic respiratory failure COPD Respiratory arrest Patient was extubated and is currently on oxygen by nasal cannula during the day and nightly BiPAP.   06/07/18: -At this time she is awake and oriented x3.   -Was previously confused and was thought to be secondary to hypercapnia.  Patient has chronic hypercapnia that is compensated.  She significant obstructive sleep apnea that likely causes her CO2 to increase at night.  She is on CPAP at home but states that it is not a good machine.  Will likely need a new CPAP on discharge -Continue albuterol every 4 hours -Continue BiPAP at night in the hospital  Embolic stroke, right MCA/PCA watershed, right parietal Tonic-clonic seizures: No history of seizures, it's possible that episode was likely from the acute respiratory failure.  -Status post Keppra load -EEG no acute seizures -Frequent neuro checks -Discontinue keppra -Continue home ASA and statin  Combined systolic and diastolic heart failure: -Discontinued Lasix 80 mg twice daily -PCCM started torsemide 60 mg daily, has better absorption and dosed daily -Continue Coreg 3.125 twice daily - Per Cardiology she will need lipids and liver checked in 8 weeks.  Elevated ammonia levels.  Patient has received lactulose and was getting lactulose enemas. Encephalopathy likely related to the hypercarbic respiratory failure, do not think that there is hepatic encephalopathy.   -CMP showed normal AST and ALT.  -Ammonia levels improved - lactulose discontinued.  Acute on chronic iron deficiency anemia, thrombocytosis: -Admission was 5.7. S/p 3U pRBC on 12/30 and 1/2. Hemoglobin improved to 9, close to her baseline.   -Continue Protonix  Type 2 diabetes: -Frequent CBGs, sliding scale insulin, continue Levemir   DVT prophylaxis: Subcutaneous heparin Code  Status: Full code Family Communication: No family at bedside Disposition Plan: Therapy recommending inpatient rehab.  Consult  requested.   Consultants:   Cardiology  Procedures:   None  Antimicrobials:   No   Subjective: She is complaining of generalized weakness.  Denies having any chest pain or shortness of breath.  Denies having any nausea or vomiting.  Objective: Vitals:   06/07/18 0737 06/07/18 0755 06/07/18 0757 06/07/18 1105  BP:  119/63 119/63   Pulse:  82 81   Resp:  18    Temp: 97.6 F (36.4 C)   97.7 F (36.5 C)  TempSrc: Oral   Oral  SpO2:      Weight:      Height:        Intake/Output Summary (Last 24 hours) at 06/07/2018 1110 Last data filed at 06/07/2018 0600 Gross per 24 hour  Intake 534.27 ml  Output 325 ml  Net 209.27 ml   Filed Weights   06/05/18 0500 06/06/18 0500 06/07/18 0500  Weight: (!) 148.5 kg (!) 147 kg (!) 147 kg    Examination:  General exam: Appears calm and comfortable  Respiratory system: Respiratory effort normal.  Decreased breath sound lower lobes, few crackles, no rhonchi or wheezing heard at this time Cardiovascular system: S1 & S2 heard, RRR. Lower extremity edema. Gastrointestinal system: Distended, nontender, positive bowel sounds Central nervous system: Alert and oriented. No focal neurological deficits. Extremities: Symmetric 5 x 5 power.  Lower extremity edema Psychiatry: Judgement and insight appear normal. Mood & affect appropriate.     Data Reviewed: I have personally reviewed following labs and imaging studies  CBC: Recent Labs  Lab 06/02/18 0518 06/03/18 0653 06/04/18 0824 06/05/18 0129 06/06/18 1022  WBC 7.2 6.8 8.1 9.1 9.0  HGB 7.6* 7.8* 7.9* 8.4* 9.0*  HCT 30.0* 30.3* 32.7* 34.5* 36.2  MCV 79.2* 81.5 82.6 84.1 81.3  PLT 470* 447* 430* 402* 407   Basic Metabolic Panel: Recent Labs  Lab 06/03/18 0653 06/04/18 0824 06/05/18 0129 06/05/18 0602 06/06/18 1022  NA 140 140 136 140 141  K 4.3 4.9 6.2* 4.1 3.8  CL 94* 93* 91* 93* 93*  CO2 41* 42* 34* 41* 42*  GLUCOSE 112* 88 134* 86 79  BUN 21* 21* 21* 19 16    CREATININE 1.02* 1.02* 1.10* 1.04* 0.94  CALCIUM 8.3* 8.4* 8.3* 8.6* 8.6*   GFR: Estimated Creatinine Clearance: 99.4 mL/min (by C-G formula based on SCr of 0.94 mg/dL). Liver Function Tests: Recent Labs  Lab 06/06/18 1022  AST 25  ALT 14  ALKPHOS 67  BILITOT 1.5*  PROT 8.8*  ALBUMIN 2.5*   No results for input(s): LIPASE, AMYLASE in the last 168 hours. Recent Labs  Lab 06/01/18 0323 06/05/18 0040 06/06/18 1022  AMMONIA 94* 138* 55*   Coagulation Profile: No results for input(s): INR, PROTIME in the last 168 hours. Cardiac Enzymes: No results for input(s): CKTOTAL, CKMB, CKMBINDEX, TROPONINI in the last 168 hours. BNP (last 3 results) No results for input(s): PROBNP in the last 8760 hours. HbA1C: No results for input(s): HGBA1C in the last 72 hours. CBG: Recent Labs  Lab 06/07/18 0105 06/07/18 0148 06/07/18 0421 06/07/18 0735 06/07/18 1108  GLUCAP 229* 225* 183* 154* 201*   Lipid Profile: Recent Labs    06/05/18 0129  TRIG 61   Thyroid Function Tests: No results for input(s): TSH, T4TOTAL, FREET4, T3FREE, THYROIDAB in the last 72 hours. Anemia Panel: No results for input(s): VITAMINB12, FOLATE, FERRITIN, TIBC, IRON,  RETICCTPCT in the last 72 hours. Sepsis Labs: Recent Labs  Lab 06/05/18 0137  LATICACIDVEN 1.4    Recent Results (from the past 240 hour(s))  MRSA PCR Screening     Status: None   Collection Time: 05/30/18 12:09 AM  Result Value Ref Range Status   MRSA by PCR NEGATIVE NEGATIVE Final    Comment:        The GeneXpert MRSA Assay (FDA approved for NASAL specimens only), is one component of a comprehensive MRSA colonization surveillance program. It is not intended to diagnose MRSA infection nor to guide or monitor treatment for MRSA infections. Performed at [facility], [place] [address] [place], [place] [address]   MRSA PCR Screening     Status: None   Collection Time: 06/05/18  5:28 AM  Result Value Ref Range  Status   MRSA by PCR NEGATIVE NEGATIVE Final    Comment:        The GeneXpert MRSA Assay (FDA approved for NASAL specimens only), is one component of a comprehensive MRSA colonization surveillance program. It is not intended to diagnose MRSA infection nor to guide or monitor treatment for MRSA infections. Performed at [facility], [place] [address], [place], [place] [address]          Radiology Studies: No results found.      Scheduled Meds: . albuterol  2.5 mg Nebulization Q4H  . aspirin  81 mg Per NG tube Daily  . carvedilol  3.125 mg Oral BID WC  . chlorhexidine gluconate (MEDLINE KIT)  15 mL Mouth Rinse BID  . Gerhardt's butt cream   Topical TID  . insulin aspart  0-9 Units Subcutaneous TID WC  . insulin detemir  15 Units Subcutaneous Daily  . multivitamin  15 mL Oral Daily  . pantoprazole  40 mg Oral Q1200  . rosuvastatin  40 mg Oral q1800  . torsemide  60 mg Oral Daily   Continuous Infusions: . sodium chloride Stopped (06/06/18 1807)  . [START ON 06/10/2018] ferumoxytol       LOS: 8 days    Yaakov Guthrie, MD [facility] Pager on [place]  If 7PM-7AM, please contact night-coverage [email or web address] Password [email or web address] 06/07/2018, 11:10 AM

## 2018-06-07 NOTE — Progress Notes (Addendum)
RN left room to get meds. RN called by unit secretary to assist other unit RNs with pt that had fallen. Pt stated she had reached for the lever to put her legs down in the recliner and the chair tipped forward, causing her to slide into the floor. Pt reported no pain or injuries. Vitals signs, blood sugar, and temperature all obtained and WDL. MD notified and family left with voicemail to call back. Pt safely assisted back to bed and bed alarm turned on.   06/07/18 1600  What Happened  Was fall witnessed? No  Was patient injured? No  Patient found on floor  Found by Staff-comment  Stated prior activity to/from bed, chair, or stretcher  Follow Up  MD notified Yaakov Guthrie MD  Time MD notified [phone]  Family notified Yes-comment (called daughter but no answer, left voicemail)  Time family notified 1655  Additional tests No  Adult Fall Risk Assessment  Risk Factor Category (scoring not indicated) Fall has occurred during this admission (document High fall risk)  Age 58  Fall History: Fall within 6 months prior to admission 0  Elimination; Bowel and/or Urine Incontinence 0  Elimination; Bowel and/or Urine Urgency/Frequency 0  Medications: includes PCA/Opiates, Anti-convulsants, Anti-hypertensives, Diuretics, Hypnotics, Laxatives, Sedatives, and Psychotropics 3  Patient Care Equipment 2  Mobility-Assistance 2  Mobility-Gait 2  Mobility-Sensory Deficit 0  Altered awareness of immediate physical environment 1  Impulsiveness 2  Lack of understanding of one's physical/cognitive limitations 4  Total Score 16  Patient Fall Risk Level High fall risk  Adult Fall Risk Interventions  Required Bundle Interventions *See Row Information* High fall risk - low, moderate, and high requirements implemented  Additional Interventions Pharmacy review of medications;Use of appropriate toileting equipment (bedpan, BSC, etc.);Room near [place] for Fall Injury Risk (To be completed on HIGH fall  risk patients) - Assessing Need for Low Bed  Risk For Fall Injury- Low Bed Criteria Previous fall this admission  Will Implement Low Bed and Floor Mats Yes  Screening for Fall Injury Risk (To be completed on HIGH fall risk patients who do not meet crieteria for Low Bed) - Assessing Need for Floor Mats Only  Risk For Fall Injury- Criteria for Floor Mats None identified - No additional interventions needed  Vitals  BP 122/66  MAP (mmHg) 78  ECG Heart Rate 82  Oxygen Therapy  SpO2 100 %  Pain Assessment  Pain Scale 0-10  Pain Score 0  PCA/Epidural/Spinal Assessment  Respiratory Pattern Regular;Unlabored  Neurological  Neuro (WDL) X  Level of Consciousness Alert  Orientation Level Oriented X4  Cognition Follows commands;Developmentally delayed  Speech Clear;Appropriate at baseline  Pupil Assessment  No  R Hand Grip Present;Moderate  L Hand Grip Present;Moderate   RUE Motor Response Purposeful movement;Responds to commands  LUE Motor Response Purposeful movement;Responds to commands  RLE Motor Response Purposeful movement;Responds to commands  LLE Motor Response Purposeful movement;Responds to commands  [name] Coma Scale  Eye Opening 4  Best Verbal Response (NON-intubated) 5  Modified Verbal Response (INTUBATED) 5  Musculoskeletal  Musculoskeletal (WDL) X  Assistive Device MaxiSlide;Front wheel walker  Generalized Weakness Yes  Weight Bearing Restrictions No  Integumentary  Integumentary (WDL) X  Skin Color Appropriate for ethnicity  Skin Condition Dry;Flaky  Skin Integrity MASD  Abrasion Location Elbow  Abrasion Location Orientation Right  Abrasion Intervention Foam  Moisture Associated Skin Damage Location Breast  Moisture Associated Skin Damage Orientation Anterior;Medial  Moisture Associated Skin Damage Intervention Other (Comment) (  interdry)  Skin Turgor Non-tenting

## 2018-06-07 NOTE — Plan of Care (Signed)
  Problem: Education: Goal: Knowledge of General Education information will improve Description Including pain rating scale, medication(s)/side effects and non-pharmacologic comfort measures Outcome: Progressing   Problem: Clinical Measurements: Goal: Ability to maintain clinical measurements within normal limits will improve Outcome: Progressing Goal: Will remain free from infection Outcome: Progressing Goal: Diagnostic test results will improve Outcome: Progressing Goal: Respiratory complications will improve Outcome: Progressing Goal: Cardiovascular complication will be avoided Outcome: Progressing   Problem: Coping: Goal: Level of anxiety will decrease Outcome: Progressing   Problem: Elimination: Goal: Will not experience complications related to bowel motility Outcome: Progressing Goal: Will not experience complications related to urinary retention Outcome: Progressing   Problem: Safety: Goal: Ability to remain free from injury will improve Outcome: Progressing   Problem: Skin Integrity: Goal: Risk for impaired skin integrity will decrease Outcome: Progressing

## 2018-06-07 NOTE — Progress Notes (Addendum)
Called [facility] and spoke w/Dr. Tamala Julian in re: to pt's CBG = 229  Pt is off all IV gtt at the moment... Dextrose w/stopped around 1500 today ... pt was able to eat some according to day shift   New orders placed... See MAR and Dr Thompson Caul note.   Pt does not have sliding scale coverage oders

## 2018-06-08 LAB — CBC
HCT: 31 % — ABNORMAL LOW (ref 36.0–46.0)
Hemoglobin: 7.9 g/dL — ABNORMAL LOW (ref 12.0–15.0)
MCH: 21.3 pg — ABNORMAL LOW (ref 26.0–34.0)
MCHC: 25.5 g/dL — ABNORMAL LOW (ref 30.0–36.0)
MCV: 83.6 fL (ref 80.0–100.0)
Platelets: 349 10*3/uL (ref 150–400)
RBC: 3.71 MIL/uL — ABNORMAL LOW (ref 3.87–5.11)
RDW: 32 % — ABNORMAL HIGH (ref 11.5–15.5)
WBC: 8.1 10*3/uL (ref 4.0–10.5)
nRBC: 0.9 % — ABNORMAL HIGH (ref 0.0–0.2)

## 2018-06-08 LAB — COMPREHENSIVE METABOLIC PANEL
ALT: 13 U/L (ref 0–44)
AST: 21 U/L (ref 15–41)
Albumin: 2.5 g/dL — ABNORMAL LOW (ref 3.5–5.0)
Alkaline Phosphatase: 80 U/L (ref 38–126)
Anion gap: 6 (ref 5–15)
BUN: 22 mg/dL — ABNORMAL HIGH (ref 6–20)
CO2: 41 mmol/L — ABNORMAL HIGH (ref 22–32)
Calcium: 8.2 mg/dL — ABNORMAL LOW (ref 8.9–10.3)
Chloride: 92 mmol/L — ABNORMAL LOW (ref 98–111)
Creatinine, Ser: 1 mg/dL (ref 0.44–1.00)
GFR calc Af Amer: >60 mL/min (ref >60)
Glucose, Bld: 195 mg/dL — ABNORMAL HIGH (ref 70–99)
Potassium: 3.7 mmol/L (ref 3.5–5.1)
Sodium: 139 mmol/L (ref 135–145)
Total Bilirubin: 1.1 mg/dL (ref 0.3–1.2)
Total Protein: 8.5 g/dL — ABNORMAL HIGH (ref 6.5–8.1)

## 2018-06-08 LAB — GLUCOSE, CAPILLARY
Glucose-Capillary: 133 mg/dL — ABNORMAL HIGH (ref 70–99)
Glucose-Capillary: 155 mg/dL — ABNORMAL HIGH (ref 70–99)
Glucose-Capillary: 169 mg/dL — ABNORMAL HIGH (ref 70–99)
Glucose-Capillary: 184 mg/dL — ABNORMAL HIGH (ref 70–99)
Glucose-Capillary: 198 mg/dL — ABNORMAL HIGH (ref 70–99)
Glucose-Capillary: 225 mg/dL — ABNORMAL HIGH (ref 70–99)

## 2018-06-08 MED ORDER — ALBUTEROL SULFATE (2.5 MG/3ML) 0.083% IN NEBU
2.5000 mg | INHALATION_SOLUTION | Freq: Three times a day (TID) | RESPIRATORY_TRACT | Status: DC
  Administered 2018-06-08: 2.5 mg via RESPIRATORY_TRACT
  Filled 2018-06-08: qty 3

## 2018-06-08 NOTE — Care Management Note (Signed)
Case Management Note  Patient Details  Name: Audreyanna Butkiewicz MRN: [id number] Date of Birth: 06/13/1960  Subjective/Objective: 59 yo female presented with generalized weakness. PMH includes asthma, HF, anemia, DM, HTN.                   Action/Plan: CM following for dispositional needs. Patient lived at home with assistance being provided with her ADLs by her significant other. PT/OT recommended IP Rehab, with the Rehab Admission Coordinator following for appropriateness. CM team will continue following.   Expected Discharge Date:  (unknown)               Expected Discharge Plan:  [place]  In-House Referral:  NA  Discharge planning Services  CM Consult  Post Acute Care Choice:  NA Choice offered to:  NA  DME Arranged:  N/A DME Agency:  NA  HH Arranged:  NA HH Agency:  NA  Status of Service:  In process, will continue to follow  If discussed at Long Length of Stay Meetings, dates discussed:    Additional Comments:  Midge Minium RN, BSN, NCM-BC, ACM-RN [phone] 06/08/2018, 10:25 AM

## 2018-06-08 NOTE — Progress Notes (Signed)
Occupational Therapy Treatment Patient Details Name: Tamara Silva MRN: [id number] DOB: 1961/04/10 Today's Date: 06/08/2018    History of present illness Pt is a 58 y.o. female admitted to [facility] on 05/29/18 with severe symptomatic anemia, volume overload and respiratory failure. Hospital course complicated by encephalopathy and speech disturbance. MRI noted acute infarct in R parietal lobe, R MCA/PCA watershed area; chronic lacunar infarct R caudate nucleus. Pt transferred to [facility] for stroke workup. ECG shows no seizure activity. Code blue called 1/6 due to seizure and unresponsiveness; ETT 1/6-1/7. Head CT 1/6 showed expected evolution of recent parietooccipital and frontoparietal infarcts, no hemorrhagic conversion or significant mass. PMH includes asthma, HF, anemia, DM, HTN.   OT comments  Pt seen for OT progress session. Pt performing bed mobility with HOB elevated and ModA. Pt tolerating sitting EOB x10 mins before sit to stand x2 times with ModA for power up and for stability. Pt MaxA for LB sitting to supine and scooting to [facility] with trendelenberg position. Pt with slow to respond speech and following most commands. Pt progressing towards goals, but not safe to perform stand pivot transfer at this time +2 would be required. Pt would benefit from continued OT skilled services for ADL, mobility and safety in CIR setting.   Follow Up Recommendations  CIR;Supervision/Assistance - 24 hour    Equipment Recommendations  None recommended by OT    Recommendations for Other Services Rehab consult    Precautions / Restrictions Precautions Precautions: Fall Restrictions Weight Bearing Restrictions: No       Mobility Bed Mobility Overal bed mobility: Needs Assistance Bed Mobility: Supine to Sit     Supine to sit: Min guard;HOB elevated Sit to supine: Mod assist;HOB elevated   General bed mobility comments: Increased time and effort; cues to attend to task throughout as pt easily distracted    Transfers Overall transfer level: Needs assistance Equipment used: Rolling walker (2 wheeled) Transfers: Sit to/from Stand Sit to Stand: Mod assist;From elevated surface              Balance     Sitting balance-Leahy Scale: Fair       Standing balance-Leahy Scale: Poor                             ADL either performed or assessed with clinical judgement   ADL Overall ADL's : Needs assistance/impaired                                     Functional mobility during ADLs: Moderate assistance;Cueing for safety;Cueing for sequencing;Rolling walker General ADL Comments: increased time for any task and requires slow speech     Vision   Vision Assessment?: Yes   Perception     Praxis      Cognition Arousal/Alertness: Awake/alert Behavior During Therapy: WFL for tasks assessed/performed Overall Cognitive Status: Impaired/Different from baseline Area of Impairment: Attention;Memory;Following commands;Safety/judgement;Awareness;Problem solving                             Problem Solving: Slow processing;Decreased initiation;Difficulty sequencing;Requires verbal cues;Requires tactile cues          Exercises     Shoulder Instructions       General Comments      Pertinent Vitals/ Pain       Pain Assessment: No/denies pain  Home Living                                          Prior Functioning/Environment              Frequency  Min 2X/week        Progress Toward Goals  OT Goals(current goals can now be found in the care plan section)  Progress towards OT goals: Progressing toward goals  Acute Rehab OT Goals Patient Stated Goal: Interested in [place] OT Goal Formulation: With patient Time For Goal Achievement: 06/15/18 Potential to Achieve Goals: Good ADL Goals Pt Will Perform Grooming: with min guard assist;sitting Pt Will Perform Upper Body Bathing: with min guard assist;sitting Pt  Will Perform Upper Body Dressing: with min guard assist;sitting Pt Will Perform Lower Body Dressing: sit to/from stand;with min assist Pt Will Transfer to Toilet: with min assist;stand pivot transfer;bedside commode Pt Will Perform Toileting - Clothing Manipulation and hygiene: with min assist;sit to/from stand Additional ADL Goal #1: Pt will maintain sitting balance EOB with minguard assist during functional task completion. Additional ADL Goal #2: Pt will consistently follow one step commands throughout ADL/functional task completion.  Plan Discharge plan remains appropriate    Co-evaluation                 AM-PAC OT "6 Clicks" Daily Activity     Outcome Measure   Help from another person eating meals?: A Little Help from another person taking care of personal grooming?: A Little Help from another person toileting, which includes using toliet, bedpan, or urinal?: A Lot Help from another person bathing (including washing, rinsing, drying)?: A Lot Help from another person to put on and taking off regular upper body clothing?: A Lot Help from another person to put on and taking off regular lower body clothing?: A Lot 6 Click Score: 14    End of Session Equipment Utilized During Treatment: Oxygen;Rolling walker  OT Visit Diagnosis: Unsteadiness on feet (R26.81);Cognitive communication deficit (R41.841) Symptoms and signs involving cognitive functions: Cerebral infarction   Activity Tolerance Patient tolerated treatment well   Patient Left with call bell/phone within reach;with chair alarm set;in bed   Nurse Communication Mobility status        Time: 2671-2458 OT Time Calculation (min): 45 min  Charges: OT General Charges $OT Visit: 1 Visit OT Treatments $Neuromuscular Re-education: 38-52 mins Tamara Silva) Marsa Aris OTR/L Acute Rehabilitation Services Pager: [phone] Office: [phone]   Tamara Silva 06/08/2018, 4:09 PM

## 2018-06-08 NOTE — Plan of Care (Signed)
  Problem: Coping: Goal: Level of anxiety will decrease Outcome: Progressing Note:  Pt stated she is anxious & depressed at times. RN talk with pt & reassured her. RN offered chaplain services pt refused at this time but told RN she would let her know if she changes her mind.   Problem: Elimination: Goal: Will not experience complications related to bowel motility Outcome: Completed/Met Goal: Will not experience complications related to urinary retention Outcome: Completed/Met   Problem: Pain Managment: Goal: General experience of comfort will improve Outcome: Completed/Met

## 2018-06-08 NOTE — Progress Notes (Signed)
RT placed pt on BIPAP dreamstation for the night. Pt settings 18/5 with 4lpm bled into system. RT will continue to monitor.

## 2018-06-08 NOTE — Progress Notes (Signed)
Inpatient Rehabilitation Admissions Coordinator  I met with patient and her significant other, [name], at bedside. I discussed goals and expectations of an inpt rehab admit and they are in agreement to admit. I will follow up tomorrow with clarifying medical readiness to d/c to inpt rehab.  Danne Baxter, RN, MSN Rehab Admissions Coordinator [phone] 06/08/2018 6:07 PM

## 2018-06-08 NOTE — Progress Notes (Addendum)
PROGRESS NOTE    Tamara Silva  WNU:[id number]  DOB: 1960-09-22  DOA: 05/29/2018 PCP: Antony Blackbird, MD  Brief Narrative:  58 year old female, history of OSA, COPD, chronic hypoxic respiratory failure(4 L at home), iron deficiency anemia who presented on 12/30 for generalized weakness , slurred speech, and a found to have acute anemia with hgb 5.7, a right MCA/PCA watershed area stroke and a 2 cm infarct in the inferior right parietal lobe (felt to be incidental), with hypercapnic respiratory failure. Placed on BiPAP but had been tolerating it well. Patient became unresponsive, had a generalized tonic-clonic seizure, apneic, and pulselessness. CODE BLUE was called. Patient was intubated and transferred to ICU. Now extubated and on nightly BIPAP.   Subjective:  Patient resting comfortably and noted to be on 5 L nasal cannula.  Uses 4 L nasal cannula at baseline.  Was supposed to be using CPAP at night but states the machine has been broke for at least a year now.  She is noted to have slurred speech which apparently is new since the stroke.  Patient's significant other is bedside and feels her mental status is back to baseline.  She is able to move all her extremities    Objective: Vitals:   06/08/18 0332 06/08/18 0345 06/08/18 0723 06/08/18 1122  BP: 122/66  125/71 128/73  Pulse: 82 80 78 80  Resp: (!) 24 20    Temp: 97.6 F (36.4 C)   98 F (36.7 C)  TempSrc: Oral  Oral   SpO2: 100% 100% 100% (!) 79%  Weight: (!) 140.7 kg     Height:        Intake/Output Summary (Last 24 hours) at 06/08/2018 1445 Last data filed at 06/08/2018 0900 Gross per 24 hour  Intake 360 ml  Output -  Net 360 ml   Filed Weights   06/06/18 0500 06/07/18 0500 06/08/18 0332  Weight: (!) 147 kg (!) 147 kg (!) 140.7 kg    Physical Examination:  General exam: Appears calm and comfortable  Respiratory system: Clear to auscultation. Respiratory effort normal. Cardiovascular system: S1 & S2 heard, RRR.  No JVD, murmurs, rubs, gallops or clicks. No pedal edema. Gastrointestinal system: Abdomen is nondistended, soft and nontender. No organomegaly or masses felt. Normal bowel sounds heard. Central nervous system: Alert and oriented, noted to have slurred speech.  Able to move all extremities with strength 4/5 Skin: No rashes, lesions or ulcers Psychiatry: Judgement and insight appear normal. Mood & affect appropriate.     Data Reviewed: I have personally reviewed following labs and imaging studies  CBC: Recent Labs  Lab 06/03/18 0653 06/04/18 0824 06/05/18 0129 06/06/18 1022 06/08/18 0322  WBC 6.8 8.1 9.1 9.0 8.1  HGB 7.8* 7.9* 8.4* 9.0* 7.9*  HCT 30.3* 32.7* 34.5* 36.2 31.0*  MCV 81.5 82.6 84.1 81.3 83.6  PLT 447* 430* 402* 381 034   Basic Metabolic Panel: Recent Labs  Lab 06/04/18 0824 06/05/18 0129 06/05/18 0602 06/06/18 1022 06/08/18 0322  NA 140 136 140 141 139  K 4.9 6.2* 4.1 3.8 3.7  CL 93* 91* 93* 93* 92*  CO2 42* 34* 41* 42* 41*  GLUCOSE 88 134* 86 79 195*  BUN 21* 21* 19 16 22*  CREATININE 1.02* 1.10* 1.04* 0.94 1.00  CALCIUM 8.4* 8.3* 8.6* 8.6* 8.2*   GFR: Estimated Creatinine Clearance: 91 mL/min (by C-G formula based on SCr of 1 mg/dL). Liver Function Tests: Recent Labs  Lab 06/06/18 1022 06/08/18 0322  AST 25 21  ALT 14 13  ALKPHOS 67 80  BILITOT 1.5* 1.1  PROT 8.8* 8.5*  ALBUMIN 2.5* 2.5*   No results for input(s): LIPASE, AMYLASE in the last 168 hours. Recent Labs  Lab 06/05/18 0040 06/06/18 1022  AMMONIA 138* 55*   Coagulation Profile: No results for input(s): INR, PROTIME in the last 168 hours. Cardiac Enzymes: No results for input(s): CKTOTAL, CKMB, CKMBINDEX, TROPONINI in the last 168 hours. BNP (last 3 results) No results for input(s): PROBNP in the last 8760 hours. HbA1C: No results for input(s): HGBA1C in the last 72 hours. CBG: Recent Labs  Lab 06/07/18 2021 06/08/18 0036 06/08/18 0336 06/08/18 0724 06/08/18 1123    GLUCAP 160* 169* 198* 155* 225*   Lipid Profile: No results for input(s): CHOL, HDL, LDLCALC, TRIG, CHOLHDL, LDLDIRECT in the last 72 hours. Thyroid Function Tests: No results for input(s): TSH, T4TOTAL, FREET4, T3FREE, THYROIDAB in the last 72 hours. Anemia Panel: No results for input(s): VITAMINB12, FOLATE, FERRITIN, TIBC, IRON, RETICCTPCT in the last 72 hours. Sepsis Labs: Recent Labs  Lab 06/05/18 0137  LATICACIDVEN 1.4    Recent Results (from the past 240 hour(s))  MRSA PCR Screening     Status: None   Collection Time: 05/30/18 12:09 AM  Result Value Ref Range Status   MRSA by PCR NEGATIVE NEGATIVE Final    Comment:        The GeneXpert MRSA Assay (FDA approved for NASAL specimens only), is one component of a comprehensive MRSA colonization surveillance program. It is not intended to diagnose MRSA infection nor to guide or monitor treatment for MRSA infections. Performed at [facility], [place] [address] [place], [name] [address]   MRSA PCR Screening     Status: None   Collection Time: 06/05/18  5:28 AM  Result Value Ref Range Status   MRSA by PCR NEGATIVE NEGATIVE Final    Comment:        The GeneXpert MRSA Assay (FDA approved for NASAL specimens only), is one component of a comprehensive MRSA colonization surveillance program. It is not intended to diagnose MRSA infection nor to guide or monitor treatment for MRSA infections. Performed at [facility], [place] [address] [place], [place] [address]       Radiology Studies: No results found.      Scheduled Meds: . albuterol  2.5 mg Nebulization TID  . aspirin  81 mg Per NG tube Daily  . carvedilol  3.125 mg Oral BID WC  . chlorhexidine gluconate (MEDLINE KIT)  15 mL Mouth Rinse BID  . Gerhardt's butt cream   Topical TID  . heparin injection (subcutaneous)  5,000 Units Subcutaneous Q8H  . insulin aspart  0-9 Units Subcutaneous TID WC  . insulin detemir  15 Units  Subcutaneous Daily  . multivitamin  15 mL Oral Daily  . pantoprazole  40 mg Oral Q1200  . rosuvastatin  40 mg Oral q1800  . torsemide  60 mg Oral Daily   Continuous Infusions: . sodium chloride Stopped (06/06/18 1807)  . [START ON 06/10/2018] ferumoxytol      Assessment & Plan:    1.  Acute on chronic hypoxic and hypercapnic respiratory failure: Present on admission.  Patient has a history of COPD, chronic combined systolic and diastolic CHF and sleep apnea.  She uses 4 L continuous nasal cannula at baseline and was supposed to be on CPAP at night but been noncompliant for at least an year.  She required intubation and ICU stay  in this hospitalization.  She is now extubated and saturating well on 5 L nasal cannula.  Titrate as tolerated.  Requested significant other to bring her home CPAP machine.  May need new equipment.  Continue neb treatments.  Not on antibiotics or steroids.  Continue oral Demadex.  2. Right MCA/PCA watershed/right parietal CVA:?  Embolic vs cocaine induced. Urine drug screen positive for cocaine on admission  On aspirin and statins. Echo EF 45%  3.  Tonic-clonic seizures: Secondary to problem #1 versus problem #2 vs cocaine induced.  EEG normal.  Received Keppra load but now of antiepileptics.  No further seizure episodes.  4.Acute on chronic iron deficiency anemia with reactive thrombocytosis: Received 3 units PRBC in this hospitalization.  Hemoglobin now improved to 9.  On PPI.    5.  Elevated ammonia level: Present on admission and patient received lactulose enemas.  Subsequently encephalopathy felt secondary to hypercarbic respiratory failure and off lactulose.  Ammonia level improved.  Normal transaminases on labs.  6.  Combined chronic systolic/diastolic CHF: Off Lasix and transitioned to torsemide 60 mg daily by critical care for better absorption/once daily dosing.  Continue Coreg.  Per cardiology patient needs lipid profile and LFTs checked every 2 months.  7.   Type 2 diabetes mellitus: Continue Levemir and sliding scale insulin  8. Sleep apnea: Needs home CPAP resumed. Last sleep study on record in 2016.   9. Cocaine abuse: counseled regarding risks.  DVT prophylaxis: Heparin Code Status: Full code Family / Patient Communication: Discussed with patient and significant other bedside. Disposition Plan: Inpatient rehab.     LOS: 9 days    Time spent: 35 minutes    Guilford Shi, MD [facility] Pager [phone]  If 7PM-7AM, please contact night-coverage [email or web address] Password [email or web address] 06/08/2018, 2:45 PM

## 2018-06-09 ENCOUNTER — Inpatient Hospital Stay (HOSPITAL_COMMUNITY)
Admission: RE | Admit: 2018-06-09 | Discharge: 2018-06-22 | DRG: 947 | Disposition: A | Discharge status: Home Health | Payer: Medicaid Other | Source: Intra-hospital | Attending: Physical Medicine & Rehabilitation | Admitting: Physical Medicine & Rehabilitation

## 2018-06-09 ENCOUNTER — Encounter (HOSPITAL_COMMUNITY): Payer: Self-pay

## 2018-06-09 ENCOUNTER — Inpatient Hospital Stay (HOSPITAL_COMMUNITY): Payer: Medicaid Other

## 2018-06-09 ENCOUNTER — Other Ambulatory Visit: Payer: Self-pay

## 2018-06-09 DIAGNOSIS — Z823 Family history of stroke: Secondary | ICD-10-CM | POA: Diagnosis not present

## 2018-06-09 DIAGNOSIS — Z6841 Body Mass Index (BMI) 40.0 and over, adult: Secondary | ICD-10-CM | POA: Diagnosis not present

## 2018-06-09 DIAGNOSIS — E1169 Type 2 diabetes mellitus with other specified complication: Secondary | ICD-10-CM | POA: Diagnosis present

## 2018-06-09 DIAGNOSIS — I509 Heart failure, unspecified: Secondary | ICD-10-CM

## 2018-06-09 DIAGNOSIS — E119 Type 2 diabetes mellitus without complications: Secondary | ICD-10-CM | POA: Diagnosis present

## 2018-06-09 DIAGNOSIS — I6389 Other cerebral infarction: Secondary | ICD-10-CM

## 2018-06-09 DIAGNOSIS — I2510 Atherosclerotic heart disease of native coronary artery without angina pectoris: Secondary | ICD-10-CM | POA: Diagnosis present

## 2018-06-09 DIAGNOSIS — I6349 Cerebral infarction due to embolism of other cerebral artery: Secondary | ICD-10-CM

## 2018-06-09 DIAGNOSIS — R0902 Hypoxemia: Secondary | ICD-10-CM | POA: Diagnosis not present

## 2018-06-09 DIAGNOSIS — E874 Mixed disorder of acid-base balance: Secondary | ICD-10-CM | POA: Diagnosis present

## 2018-06-09 DIAGNOSIS — I5042 Chronic combined systolic (congestive) and diastolic (congestive) heart failure: Secondary | ICD-10-CM | POA: Diagnosis present

## 2018-06-09 DIAGNOSIS — Z886 Allergy status to analgesic agent status: Secondary | ICD-10-CM

## 2018-06-09 DIAGNOSIS — Z833 Family history of diabetes mellitus: Secondary | ICD-10-CM

## 2018-06-09 DIAGNOSIS — J9611 Chronic respiratory failure with hypoxia: Secondary | ICD-10-CM | POA: Diagnosis not present

## 2018-06-09 DIAGNOSIS — J9622 Acute and chronic respiratory failure with hypercapnia: Secondary | ICD-10-CM | POA: Diagnosis present

## 2018-06-09 DIAGNOSIS — Z825 Family history of asthma and other chronic lower respiratory diseases: Secondary | ICD-10-CM

## 2018-06-09 DIAGNOSIS — Z87891 Personal history of nicotine dependence: Secondary | ICD-10-CM

## 2018-06-09 DIAGNOSIS — J441 Chronic obstructive pulmonary disease with (acute) exacerbation: Secondary | ICD-10-CM

## 2018-06-09 DIAGNOSIS — G9340 Encephalopathy, unspecified: Secondary | ICD-10-CM | POA: Diagnosis present

## 2018-06-09 DIAGNOSIS — Z955 Presence of coronary angioplasty implant and graft: Secondary | ICD-10-CM | POA: Diagnosis not present

## 2018-06-09 DIAGNOSIS — G9341 Metabolic encephalopathy: Secondary | ICD-10-CM | POA: Diagnosis present

## 2018-06-09 DIAGNOSIS — E7220 Disorder of urea cycle metabolism, unspecified: Secondary | ICD-10-CM | POA: Diagnosis present

## 2018-06-09 DIAGNOSIS — Z803 Family history of malignant neoplasm of breast: Secondary | ICD-10-CM

## 2018-06-09 DIAGNOSIS — Z800 Family history of malignant neoplasm of digestive organs: Secondary | ICD-10-CM

## 2018-06-09 DIAGNOSIS — R0989 Other specified symptoms and signs involving the circulatory and respiratory systems: Secondary | ICD-10-CM | POA: Diagnosis not present

## 2018-06-09 DIAGNOSIS — I69398 Other sequelae of cerebral infarction: Secondary | ICD-10-CM | POA: Diagnosis not present

## 2018-06-09 DIAGNOSIS — R062 Wheezing: Secondary | ICD-10-CM | POA: Diagnosis not present

## 2018-06-09 DIAGNOSIS — Z86711 Personal history of pulmonary embolism: Secondary | ICD-10-CM | POA: Diagnosis not present

## 2018-06-09 DIAGNOSIS — K760 Fatty (change of) liver, not elsewhere classified: Secondary | ICD-10-CM

## 2018-06-09 DIAGNOSIS — L309 Dermatitis, unspecified: Secondary | ICD-10-CM | POA: Diagnosis not present

## 2018-06-09 DIAGNOSIS — Z23 Encounter for immunization: Secondary | ICD-10-CM | POA: Diagnosis not present

## 2018-06-09 DIAGNOSIS — I5082 Biventricular heart failure: Secondary | ICD-10-CM | POA: Diagnosis present

## 2018-06-09 DIAGNOSIS — G4733 Obstructive sleep apnea (adult) (pediatric): Secondary | ICD-10-CM | POA: Diagnosis present

## 2018-06-09 DIAGNOSIS — K5901 Slow transit constipation: Secondary | ICD-10-CM | POA: Diagnosis present

## 2018-06-09 DIAGNOSIS — E662 Morbid (severe) obesity with alveolar hypoventilation: Secondary | ICD-10-CM | POA: Diagnosis present

## 2018-06-09 DIAGNOSIS — J9621 Acute and chronic respiratory failure with hypoxia: Secondary | ICD-10-CM | POA: Diagnosis present

## 2018-06-09 DIAGNOSIS — I110 Hypertensive heart disease with heart failure: Secondary | ICD-10-CM | POA: Diagnosis present

## 2018-06-09 DIAGNOSIS — Z9981 Dependence on supplemental oxygen: Secondary | ICD-10-CM | POA: Diagnosis not present

## 2018-06-09 DIAGNOSIS — I69322 Dysarthria following cerebral infarction: Secondary | ICD-10-CM | POA: Diagnosis not present

## 2018-06-09 DIAGNOSIS — Z7982 Long term (current) use of aspirin: Secondary | ICD-10-CM

## 2018-06-09 DIAGNOSIS — J449 Chronic obstructive pulmonary disease, unspecified: Secondary | ICD-10-CM | POA: Diagnosis present

## 2018-06-09 DIAGNOSIS — R5381 Other malaise: Principal | ICD-10-CM | POA: Diagnosis present

## 2018-06-09 DIAGNOSIS — Z8249 Family history of ischemic heart disease and other diseases of the circulatory system: Secondary | ICD-10-CM

## 2018-06-09 DIAGNOSIS — I5043 Acute on chronic combined systolic (congestive) and diastolic (congestive) heart failure: Secondary | ICD-10-CM | POA: Diagnosis present

## 2018-06-09 DIAGNOSIS — Z9111 Patient's noncompliance with dietary regimen: Secondary | ICD-10-CM

## 2018-06-09 DIAGNOSIS — I5033 Acute on chronic diastolic (congestive) heart failure: Secondary | ICD-10-CM | POA: Diagnosis not present

## 2018-06-09 DIAGNOSIS — J9612 Chronic respiratory failure with hypercapnia: Secondary | ICD-10-CM

## 2018-06-09 DIAGNOSIS — D509 Iron deficiency anemia, unspecified: Secondary | ICD-10-CM | POA: Diagnosis present

## 2018-06-09 DIAGNOSIS — I2781 Cor pulmonale (chronic): Secondary | ICD-10-CM | POA: Diagnosis present

## 2018-06-09 DIAGNOSIS — E669 Obesity, unspecified: Secondary | ICD-10-CM | POA: Diagnosis present

## 2018-06-09 DIAGNOSIS — Z888 Allergy status to other drugs, medicaments and biological substances status: Secondary | ICD-10-CM

## 2018-06-09 DIAGNOSIS — Z79899 Other long term (current) drug therapy: Secondary | ICD-10-CM

## 2018-06-09 DIAGNOSIS — Z794 Long term (current) use of insulin: Secondary | ICD-10-CM

## 2018-06-09 DIAGNOSIS — Z9119 Patient's noncompliance with other medical treatment and regimen: Secondary | ICD-10-CM

## 2018-06-09 DIAGNOSIS — R41841 Cognitive communication deficit: Secondary | ICD-10-CM | POA: Diagnosis present

## 2018-06-09 HISTORY — DX: Fatty (change of) liver, not elsewhere classified: K76.0

## 2018-06-09 LAB — GLUCOSE, CAPILLARY
Glucose-Capillary: 129 mg/dL — ABNORMAL HIGH (ref 70–99)
Glucose-Capillary: 199 mg/dL — ABNORMAL HIGH (ref 70–99)
Glucose-Capillary: 210 mg/dL — ABNORMAL HIGH (ref 70–99)
Glucose-Capillary: 127 mg/dL — ABNORMAL HIGH (ref 70–99)

## 2018-06-09 MED ORDER — CHLORHEXIDINE GLUCONATE 0.12% ORAL RINSE (MEDLINE KIT): 15.0000 mL | Freq: Two times a day (BID) | OROMUCOSAL | 0 refills | Status: DC

## 2018-06-09 MED ORDER — GUAIFENESIN-DM 100-10 MG/5ML PO SYRP: 5.0000 mL | ORAL_SOLUTION | Freq: Four times a day (QID) | ORAL | Status: DC | PRN

## 2018-06-09 MED ORDER — BISACODYL 10 MG RE SUPP: 10.0000 mg | Freq: Every day | RECTAL | Status: DC | PRN

## 2018-06-09 MED ORDER — ALUM & MAG HYDROXIDE-SIMETH 200-200-20 MG/5ML PO SUSP: 30.0000 mL | ORAL | Status: DC | PRN

## 2018-06-09 MED ORDER — TORSEMIDE 20 MG PO TABS: 60.0000 mg | ORAL_TABLET | Freq: Every day | ORAL | Status: DC

## 2018-06-09 MED ORDER — PROCHLORPERAZINE MALEATE 5 MG PO TABS: 5.0000 mg | ORAL_TABLET | Freq: Four times a day (QID) | ORAL | Status: DC | PRN

## 2018-06-09 MED ORDER — ASPIRIN 81 MG PO CHEW
81.0000 mg | CHEWABLE_TABLET | Freq: Every day | ORAL | Status: DC
  Administered 2018-06-10 – 2018-06-22 (×13): 81 mg via NASOGASTRIC
  Filled 2018-06-09 (×13): qty 1

## 2018-06-09 MED ORDER — ACETAMINOPHEN 325 MG PO TABS: 325.0000 mg | ORAL_TABLET | ORAL | Status: DC | PRN

## 2018-06-09 MED ORDER — TORSEMIDE 20 MG PO TABS
60.0000 mg | ORAL_TABLET | Freq: Every day | ORAL | Status: DC
  Administered 2018-06-10: 60 mg via ORAL
  Filled 2018-06-09: qty 3

## 2018-06-09 MED ORDER — INFLUENZA VAC SPLIT QUAD 0.5 ML IM SUSY
0.5000 mL | PREFILLED_SYRINGE | INTRAMUSCULAR | Status: AC
  Administered 2018-06-13: 0.5 mL via INTRAMUSCULAR
  Filled 2018-06-09: qty 0.5

## 2018-06-09 MED ORDER — FLEET ENEMA 7-19 GM/118ML RE ENEM
1.0000 | ENEMA | Freq: Once | RECTAL | Status: AC | PRN
  Administered 2018-06-12: 1 via RECTAL
  Filled 2018-06-09: qty 1

## 2018-06-09 MED ORDER — ACETAMINOPHEN 325 MG PO TABS: 650.0000 mg | ORAL_TABLET | Freq: Four times a day (QID) | ORAL | Status: DC | PRN

## 2018-06-09 MED ORDER — ROSUVASTATIN CALCIUM 40 MG PO TABS: 40.0000 mg | ORAL_TABLET | Freq: Every day | ORAL | Status: DC

## 2018-06-09 MED ORDER — INSULIN DETEMIR 100 UNIT/ML ~~LOC~~ SOLN
15.0000 [IU] | Freq: Every day | SUBCUTANEOUS | Status: DC
  Administered 2018-06-10 – 2018-06-16 (×7): 15 [IU] via SUBCUTANEOUS
  Filled 2018-06-09 (×9): qty 0.15

## 2018-06-09 MED ORDER — TRAZODONE HCL 50 MG PO TABS
25.0000 mg | ORAL_TABLET | Freq: Every evening | ORAL | Status: DC | PRN
  Administered 2018-06-14 – 2018-06-20 (×5): 50 mg via ORAL
  Filled 2018-06-09 (×5): qty 1

## 2018-06-09 MED ORDER — ONDANSETRON HCL 4 MG/2ML IJ SOLN: 4.0000 mg | Freq: Four times a day (QID) | INTRAMUSCULAR | Status: DC | PRN

## 2018-06-09 MED ORDER — ONDANSETRON HCL 4 MG PO TABS: 4.0000 mg | ORAL_TABLET | Freq: Four times a day (QID) | ORAL | Status: DC | PRN

## 2018-06-09 MED ORDER — PANTOPRAZOLE SODIUM 40 MG PO TBEC: 40.0000 mg | DELAYED_RELEASE_TABLET | Freq: Every day | ORAL | Status: DC

## 2018-06-09 MED ORDER — PANTOPRAZOLE SODIUM 40 MG PO TBEC
40.0000 mg | DELAYED_RELEASE_TABLET | Freq: Every day | ORAL | Status: DC
  Administered 2018-06-10 – 2018-06-22 (×13): 40 mg via ORAL
  Filled 2018-06-09 (×13): qty 1

## 2018-06-09 MED ORDER — IPRATROPIUM-ALBUTEROL 0.5-2.5 (3) MG/3ML IN SOLN
3.0000 mL | Freq: Four times a day (QID) | RESPIRATORY_TRACT | Status: DC | PRN
  Administered 2018-06-09 – 2018-06-10 (×3): 3 mL via RESPIRATORY_TRACT
  Filled 2018-06-09 (×3): qty 3

## 2018-06-09 MED ORDER — INSULIN DETEMIR 100 UNIT/ML ~~LOC~~ SOLN: 15.0000 [IU] | Freq: Every day | SUBCUTANEOUS | 11 refills | Status: DC

## 2018-06-09 MED ORDER — ADULT MULTIVITAMIN LIQUID CH
15.0000 mL | Freq: Every day | ORAL | Status: DC
  Administered 2018-06-10 – 2018-06-22 (×12): 15 mL via ORAL
  Filled 2018-06-09 (×14): qty 15

## 2018-06-09 MED ORDER — POLYETHYLENE GLYCOL 3350 17 G PO PACK
17.0000 g | PACK | Freq: Every day | ORAL | Status: DC | PRN
  Administered 2018-06-09: 17 g via ORAL
  Filled 2018-06-09: qty 1

## 2018-06-09 MED ORDER — ROSUVASTATIN CALCIUM 20 MG PO TABS
40.0000 mg | ORAL_TABLET | Freq: Every day | ORAL | Status: DC
  Administered 2018-06-09 – 2018-06-21 (×12): 40 mg via ORAL
  Filled 2018-06-09 (×13): qty 2

## 2018-06-09 MED ORDER — ONDANSETRON HCL 4 MG PO TABS: 4.0000 mg | ORAL_TABLET | Freq: Four times a day (QID) | ORAL | 0 refills | Status: DC | PRN

## 2018-06-09 MED ORDER — PROCHLORPERAZINE EDISYLATE 10 MG/2ML IJ SOLN: 5.0000 mg | Freq: Four times a day (QID) | INTRAMUSCULAR | Status: DC | PRN

## 2018-06-09 MED ORDER — HYDROCERIN EX CREA
TOPICAL_CREAM | Freq: Two times a day (BID) | CUTANEOUS | Status: DC
  Administered 2018-06-09 – 2018-06-13 (×8): via TOPICAL
  Administered 2018-06-13: 1 via TOPICAL
  Administered 2018-06-14: 21:00:00 via TOPICAL
  Administered 2018-06-14: 1 via TOPICAL
  Administered 2018-06-15 – 2018-06-22 (×13): via TOPICAL
  Filled 2018-06-09: qty 113

## 2018-06-09 MED ORDER — INSULIN ASPART 100 UNIT/ML ~~LOC~~ SOLN: 0.0000 [IU] | Freq: Three times a day (TID) | SUBCUTANEOUS | 11 refills | Status: DC

## 2018-06-09 MED ORDER — HYDRALAZINE HCL 20 MG/ML IJ SOLN: 5.0000 mg | Freq: Four times a day (QID) | INTRAMUSCULAR | Status: DC | PRN

## 2018-06-09 MED ORDER — HEPARIN SODIUM (PORCINE) 5000 UNIT/ML IJ SOLN: 5000.0000 [IU] | Freq: Three times a day (TID) | INTRAMUSCULAR | Status: DC

## 2018-06-09 MED ORDER — GERHARDT'S BUTT CREAM
TOPICAL_CREAM | Freq: Three times a day (TID) | CUTANEOUS | Status: DC
  Administered 2018-06-09 – 2018-06-12 (×11): via TOPICAL
  Administered 2018-06-13: 1 via TOPICAL
  Administered 2018-06-13 – 2018-06-14 (×4): via TOPICAL
  Administered 2018-06-14: 1 via TOPICAL
  Administered 2018-06-15 – 2018-06-22 (×19): via TOPICAL
  Filled 2018-06-09: qty 1

## 2018-06-09 MED ORDER — DIPHENHYDRAMINE HCL 12.5 MG/5ML PO ELIX: 12.5000 mg | ORAL_SOLUTION | Freq: Four times a day (QID) | ORAL | Status: DC | PRN

## 2018-06-09 MED ORDER — CHLORHEXIDINE GLUCONATE 0.12% ORAL RINSE (MEDLINE KIT)
15.0000 mL | Freq: Two times a day (BID) | OROMUCOSAL | Status: DC
  Administered 2018-06-10 – 2018-06-20 (×20): 15 mL via OROMUCOSAL

## 2018-06-09 MED ORDER — INSULIN ASPART 100 UNIT/ML ~~LOC~~ SOLN
0.0000 [IU] | Freq: Three times a day (TID) | SUBCUTANEOUS | Status: DC
  Administered 2018-06-11 – 2018-06-12 (×3): 1 [IU] via SUBCUTANEOUS
  Administered 2018-06-13 – 2018-06-17 (×13): 2 [IU] via SUBCUTANEOUS
  Administered 2018-06-18: 1 [IU] via SUBCUTANEOUS
  Administered 2018-06-18: 2 [IU] via SUBCUTANEOUS
  Administered 2018-06-18: 3 [IU] via SUBCUTANEOUS
  Administered 2018-06-19: 1 [IU] via SUBCUTANEOUS
  Administered 2018-06-19: 3 [IU] via SUBCUTANEOUS
  Administered 2018-06-19: 2 [IU] via SUBCUTANEOUS
  Administered 2018-06-20: 1 [IU] via SUBCUTANEOUS
  Administered 2018-06-20 (×2): 3 [IU] via SUBCUTANEOUS
  Administered 2018-06-21 (×2): 2 [IU] via SUBCUTANEOUS
  Administered 2018-06-22: 3 [IU] via SUBCUTANEOUS
  Administered 2018-06-22: 2 [IU] via SUBCUTANEOUS

## 2018-06-09 MED ORDER — ALBUTEROL SULFATE (2.5 MG/3ML) 0.083% IN NEBU: 2.5000 mg | INHALATION_SOLUTION | RESPIRATORY_TRACT | Status: DC | PRN

## 2018-06-09 MED ORDER — PROCHLORPERAZINE 25 MG RE SUPP: 12.5000 mg | Freq: Four times a day (QID) | RECTAL | Status: DC | PRN

## 2018-06-09 MED ORDER — CARVEDILOL 3.125 MG PO TABS
3.1250 mg | ORAL_TABLET | Freq: Two times a day (BID) | ORAL | Status: DC
  Administered 2018-06-09 – 2018-06-22 (×25): 3.125 mg via ORAL
  Filled 2018-06-09 (×26): qty 1

## 2018-06-09 NOTE — H&P (Signed)
Physical Medicine and Rehabilitation Admission H&P    Chief Complaint  Patient presents with  . Debility      HPI:  Tamara Silva is a 58 year old female with history of T2DM, COPD with chronic hypoxic respiratory failure- 4L oxygen dependent, OSA- no CPAP x2 years, anemia, morbid obesity-BMI 50; who was admitted to [facility] on 05/29/2018 with generalized weakness, lethargy, acute on chronic anemia with hemoglobin 5.7 and slurred speech.  Family reported issues with confusion for a few days prior to admission.  Ammonia levels were elevated 94 and UDS positive for cocaine.  She was treated with diuretics for acute on chronic biventricular heart failure, placed on BiPAP and transfused with 2 units packed red blood cells.  Cardiology consulted for input and recommended holding aspirin and paracentral till source of bleeding found.  CT head done revealing right acute to subacute occipitoparietal infarct therefore she was transferred to [facility] for management.  Neurology felt stroke most likely cardioembolic but patient not a good long-term AC candidate due to chronic anemia.  To add ASA back once H&H stable.  2D echo with EF of 45%.  She was weaned from BiPAP to CPAP however has been noncompliant with use.  On 1/5, she did have progressive hypoxia with episode of unresponsiveness and generalized seizure type activity requiring intubation.  She was loaded with Keppra and follow-up CT head was negative for extension of the hemorrhage.  She tolerated extubation to BiPAP on 1/6 and was started on dysphagia 3 diet.  Acute on chronic CHF improving and IV Lasix transitioned to Demadex.  Respiratory failure felt to be multifactorial due to OHS, sleep apnea and COPD.  Therapy initiated and patient noted to have issues with delayed processing, poor safety awareness, deficits in mobility and ADL tasks.  CIR recommended due to functional decline   Review of Systems  Unable to perform ROS: Mental acuity  Eyes:  Negative for blurred vision.  Respiratory: Positive for cough and wheezing.   Cardiovascular: Negative for chest pain.  Gastrointestinal: Negative for heartburn.  Musculoskeletal: Positive for myalgias.  Neurological: Positive for sensory change and focal weakness.  Psychiatric/Behavioral: Positive for memory loss.      Past Medical History:  Diagnosis Date  . Asthma   . CAD (coronary artery disease)   . Chronic combined systolic (congestive) and diastolic (congestive) heart failure ([place])   . Chronic iron deficiency anemia   . Diabetes mellitus without complication ([place])   . DOE (dyspnea on exertion)   . Edema of both legs   . Hypertension   . Pulmonary emboli ([place]) 2014   Past Surgical History:  Procedure Laterality Date  . COLONOSCOPY WITH PROPOFOL N/A 08/03/2013   Procedure: COLONOSCOPY WITH PROPOFOL;  Surgeon: Jeryl Columbia, MD;  Location: [facility] ENDOSCOPY;  Service: Endoscopy;  Laterality: N/A;  . CORONARY ANGIOPLASTY WITH STENT PLACEMENT     CAD in 2006 x 2 and 2009 2 more- place din REx in [place] and [place] med  . CORONARY ANGIOPLASTY WITH STENT PLACEMENT  10/07/2013   Xience [place] DES 2.75  mm x 15  mm  . ESOPHAGOGASTRODUODENOSCOPY (EGD) WITH PROPOFOL N/A 08/03/2013   Procedure: ESOPHAGOGASTRODUODENOSCOPY (EGD) WITH PROPOFOL;  Surgeon: Jeryl Columbia, MD;  Location: [facility] ENDOSCOPY;  Service: Endoscopy;  Laterality: N/A;  . LEFT HEART CATHETERIZATION WITH CORONARY ANGIOGRAM N/A 10/07/2013   Procedure: LEFT HEART CATHETERIZATION WITH CORONARY ANGIOGRAM;  Surgeon: Leonie Man, MD;  Location: [facility] CATH LAB;  Service: Cardiovascular;  Laterality: N/A;   Family History  Problem Relation Age of Onset  . Stroke Mother   . Hypertension Mother   . Colon cancer Father   . Diabetes Father   . Heart attack Father   . Sarcoidosis Other   . Breast cancer Paternal Aunt   . Emphysema Brother   . Lung disease Brother        Unknown type, 3 brothers, one with liver and lung disease  . Asthma  Paternal Aunt     Social History: Lives with boyfriend. Has had decline in activity tolerance since last fall.  She  reports that she quit smoking about 4 years ago. Her smoking use included cigarettes. She has a 10.00 pack-year smoking history. She has never used smokeless tobacco. She reports that she does not drink alcohol or use drugs.    Allergies  Allergen Reactions  . Metolazone Other (See Comments)    Dizziness and falling  . Plavix [Clopidogrel Bisulfate] Other (See Comments)    High PRU's - non-responder  . Ibuprofen     Patient stated she can not take ibuprofen.   . Nsaids Other (See Comments)    "I do not take NSAIDs d/t interfering with other meds"   Medications Prior to Admission  Medication Sig Dispense Refill  . ACCU-CHEK AVIVA PLUS test strip USE 1 STRIP TO CHECK GLUCOSE 3 TIMES DAILY 100 each 0  . ACCU-CHEK FASTCLIX LANCETS MISC 1 applicator by Does not apply route 4 (four) times daily -  before meals and at bedtime. 100 each 11  . ACCU-CHEK SOFTCLIX LANCETS lancets USE 1 LANCET TO CHECK GLUCOSE 3 TIMES DAILY 100 each 10  . aspirin 81 MG tablet Take 1 tablet (81 mg total) by mouth daily.    . Blood Glucose Monitoring Suppl (ACCU-CHEK AVIVA) device Use as instructed 3 times daily before meals. 1 each 0  . carvedilol (COREG) 12.5 MG tablet TAKE 1 TABLET BY MOUTH 2 TIMES A DAY WITH A MEAL **PT NEEDS TO SCHEDULE AN APPT WITH OUR OFFICE AT [phone] FOR FUTURE REFILLS 60 tablet 1  . fluticasone (FLONASE) 50 MCG/ACT nasal spray INSTILL 2 SPRAYS IN EACH NOSTRIL TWICE DAILY. 1 g 1  . furosemide (LASIX) 40 MG tablet Take 1.5 tablets (60 mg total) by mouth 2 (two) times daily. 90 tablet 0  . Insulin Pen Needle (TRUEPLUS PEN NEEDLES) 32G X 4 MM MISC Use three times daily to inject insulin 100 each 11  . INSULIN SYRINGE .5CC/29G 29G X 1/2" 0.5 ML MISC Use as instructed by physician 100 each 12  . ipratropium-albuterol (DUONEB) 0.5-2.5 (3) MG/3ML SOLN Use nebulizer to inhale  contents of one vial every 6 hours as needed for shortness of breath, wheezing 360 mL 11  . LEVEMIR 100 UNIT/ML injection INJECT 45 UNITS SUBCUTANEOUSLY EVERY NIGHT AT BEDTIME *MUST HAVE OFFICE VISIT FOR REFILLS* 2 vial 0  . losartan (COZAAR) 50 MG tablet Take 1 tablet (50 mg total) by mouth daily. 90 tablet 1  . nitroGLYCERIN (NITROSTAT) 0.4 MG SL tablet Place 1 tablet (0.4 mg total) under the tongue every 5 (five) minutes as needed for chest pain. 25 tablet 11  . NOVOLOG FLEXPEN 100 UNIT/ML FlexPen INJECT 15-20 UNITS SUBCUTANEOUSLY 3 TIMES DAILY WITH MEALS. **PATIENT MUST HAVE OFFICE VISIT FOR FUTURE REFILLS** 2 pen 0  . omeprazole (PRILOSEC) 40 MG capsule TAKE 1 CAPSULE BY MOUTH TWICE DAILY. 60 capsule 1  . polyethylene glycol (MIRALAX / GLYCOLAX) packet Take 17 g by mouth  daily. 30 each 6  . prasugrel (EFFIENT) 10 MG TABS tablet TAKE ONE TABLET BY MOUTH DAILY. 30 tablet 0  . rosuvastatin (CRESTOR) 10 MG tablet Take 1 tablet (10 mg total) by mouth daily. 90 tablet 2  . spironolactone (ALDACTONE) 25 MG tablet Take 1 tablet (25 mg total) by mouth daily. 90 tablet 3  . iron polysaccharides (NIFEREX) 150 MG capsule Take 1 capsule (150 mg total) by mouth daily. (Patient not taking: Reported on 05/29/2018) 30 capsule 11  . prazosin (MINIPRESS) 5 MG capsule TAKE 1 CAPSULE BY MOUTH EVERY NIGHT AT BEDTIME FOR NIGHTMARES. (Patient not taking: Reported on 05/29/2018) 30 capsule 0  . PROAIR HFA 108 (90 Base) MCG/ACT inhaler INHALE 2 PUFFS BY MOUTH 4 TIMES DAILY AS NEEDED (Patient not taking: Reported on 05/29/2018) 8.5 each 2  . senna (SENOKOT) 8.6 MG TABS tablet Take 1 tablet (8.6 mg total) by mouth 2 (two) times daily. (Patient not taking: Reported on 05/29/2018) 120 each 0    Drug Regimen Review  Drug regimen was reviewed and remains appropriate with no significant issues identified  Home: Home Living Family/patient expects to be discharged to:: Private residence Living Arrangements:  Spouse/significant other Available Help at Discharge: Family, Available 24 hours/day Type of Home: House Home Access: Stairs to enter [employer] of Steps: 2 Home Layout: One level Bathroom Shower/Tub: (pateitn does not use) Bathroom Toilet: Handicapped height Bathroom Accessibility: Yes Home Equipment: [place] - single point, Bedside commode Additional Comments: PLOF reviewed with [name]  Lives With: Significant other([name])   Functional History: Prior Function Level of Independence: Needs assistance Gait / Transfers Assistance Needed: ambulates short distances with RW per [name] ADL's / Homemaking Assistance Needed: [name] helps sponge bathe as she stands at side of bed  Functional Status:  Mobility: Bed Mobility Overal bed mobility: Needs Assistance Bed Mobility: Supine to Sit Supine to sit: Min guard, HOB elevated Sit to supine: Mod assist, HOB elevated General bed mobility comments: Increased time and effort; cues to attend to task throughout as pt easily distracted  Transfers Overall transfer level: Needs assistance Equipment used: Rolling walker (2 wheeled) Transfers: Sit to/from Stand Sit to Stand: Mod assist, From elevated surface Stand pivot transfers: +2 physical assistance, Min assist General transfer comment: Initially stood from bed with bilateral HHA and modA+2; stood from [facility] with min guard, heavy reliance on BUE support to push into standing Ambulation/Gait Ambulation/Gait assistance: Min assist, +2 safety/equipment, Mod assist Gait Distance (Feet): 12 Feet Assistive device: Rolling walker (2 wheeled) Gait Pattern/deviations: Leaning posteriorly, Step-to pattern General Gait Details: Slow, unsteady amb with RW and minA+2 for balance and chair follow; pt with very poor attention, intermittent self-corrected knee buckling, and abrupt attempt to sit requiring assist+3 for safety and modA for descent to recliner. SpO2 down to 78% on 2L O2 [place], returning to  >88% on 4L O2 with rest and deep breathing Gait velocity: Decreased Gait velocity interpretation: <1.31 ft/sec, indicative of household ambulator    ADL: ADL Overall ADL's : Needs assistance/impaired Eating/Feeding: Minimal assistance, Sitting Eating/Feeding Details (indicate cue type and reason): pt request applesauce, but when it is handed to her, she passively stares at it, and at times, refuses to self feed.  Cues provided to perform on her own.  Once she gets started, she is able to continue task with supervision  Grooming: Wash/dry face, Sitting, Set up Grooming Details (indicate cue type and reason): assist to initiate and for thoroughness  Upper Body Bathing: Maximal assistance, Sitting Lower  Body Bathing: Maximal assistance, Sit to/from stand Upper Body Dressing : Moderate assistance, Sitting Upper Body Dressing Details (indicate cue type and reason): requires increased assist due to poor sitting balance Lower Body Dressing: Total assistance, Sit to/from stand Toilet Transfer: +2 for physical assistance, Minimal assistance, Stand-pivot, BSC Toileting- Clothing Manipulation and Hygiene: Total assistance, Sit to/from stand Functional mobility during ADLs: Moderate assistance, Cueing for safety, Cueing for sequencing, Rolling walker General ADL Comments: increased time for any task and requires slow speech  Cognition: Cognition Overall Cognitive Status: Impaired/Different from baseline Arousal/Alertness: Awake/alert Orientation Level: Oriented X4 Attention: Focused Focused Attention: Impaired Focused Attention Impairment: Verbal basic, Functional basic Memory: Impaired Memory Impairment: Decreased recall of new information Awareness: Appears intact Problem Solving: Impaired Problem Solving Impairment: Verbal basic Executive Function: Organizing, Self Monitoring, Self Correcting Organizing: Impaired Organizing Impairment: Verbal basic Self Monitoring: Impaired Self  Monitoring Impairment: Verbal basic Self Correcting: Impaired Self Correcting Impairment: Verbal basic Behaviors: Perseveration, Confabulation Safety/Judgment: Impaired Cognition Arousal/Alertness: Awake/alert Behavior During Therapy: WFL for tasks assessed/performed Overall Cognitive Status: Impaired/Different from baseline Area of Impairment: Attention, Memory, Following commands, Safety/judgement, Awareness, Problem solving Current Attention Level: Sustained, Focused Memory: Decreased short-term memory Following Commands: Follows one step commands with increased time Safety/Judgement: Decreased awareness of safety, Decreased awareness of deficits Awareness: Intellectual, Emergent Problem Solving: Slow processing, Decreased initiation, Difficulty sequencing, Requires verbal cues, Requires tactile cues General Comments: Sister present and reports pt speech seems slower than usual (sister reports she has not seen pt in a while, so not 100% what baseline). Pt with poor attention, difficulty dividing attention. Slow to initiate movement. Requires frequent cues for redirection. Cues for simple problem solving   Blood pressure 130/70, pulse 73, temperature 98 F (36.7 C), temperature source Oral, resp. rate (!) 86, height 5' 7.5" (1.715 m), weight (!) 138.7 kg, last menstrual period 07/28/2013, SpO2 94 %. Physical Exam  Nursing note and vitals reviewed. Constitutional: She is oriented to person, place, and time. She appears well-developed and well-nourished. She appears lethargic. She is easily aroused.  Morbidly obese female  HENT:  Head: Normocephalic.  Eyes: Pupils are equal, round, and reactive to light.  Respiratory: She has wheezes (audible).  Occasional productive cough  Musculoskeletal:        General: Edema present.  Neurological: She is oriented to person, place, and time and easily aroused. She appears lethargic. Coordination normal.  Speech slurred and more delayed  today--reports fatigue. Very slow to engage. Able to follow simple motor commands. UE 2-3/5 prox to distal. LE: 2/5 HF, KE and 3/5 ADF/PF. Decreased LT distally.   Skin: Skin is warm.  Skin tears under breasts and dry flaky skin on abdomen and BLE. Healed lesions on extremities.   Psychiatric:  Pleasant and cooperative    Results for orders placed or performed during the hospital encounter of 05/29/18 (from the past 48 hour(s))  Glucose, capillary     Status: Abnormal   Collection Time: 06/07/18  3:53 PM  Result Value Ref Range   Glucose-Capillary 159 (H) 70 - 99 mg/dL  Glucose, capillary     Status: Abnormal   Collection Time: 06/07/18  8:21 PM  Result Value Ref Range   Glucose-Capillary 160 (H) 70 - 99 mg/dL  Glucose, capillary     Status: Abnormal   Collection Time: 06/08/18 12:36 AM  Result Value Ref Range   Glucose-Capillary 169 (H) 70 - 99 mg/dL  CBC     Status: Abnormal   Collection Time: 06/08/18  3:22  AM  Result Value Ref Range   WBC 8.1 4.0 - 10.5 K/uL   RBC 3.71 (L) 3.87 - 5.11 MIL/uL   Hemoglobin 7.9 (L) 12.0 - 15.0 g/dL   HCT 31.0 (L) 36.0 - 46.0 %   MCV 83.6 80.0 - 100.0 fL   MCH 21.3 (L) 26.0 - 34.0 pg   MCHC 25.5 (L) 30.0 - 36.0 g/dL   RDW 32.0 (H) 11.5 - 15.5 %   Platelets 349 150 - 400 K/uL   nRBC 0.9 (H) 0.0 - 0.2 %    Comment: Performed at [facility] [address] [place], [place] [address]  Comprehensive metabolic panel     Status: Abnormal   Collection Time: 06/08/18  3:22 AM  Result Value Ref Range   Sodium 139 135 - 145 mmol/L   Potassium 3.7 3.5 - 5.1 mmol/L   Chloride 92 (L) 98 - 111 mmol/L   CO2 41 (H) 22 - 32 mmol/L   Glucose, Bld 195 (H) 70 - 99 mg/dL   BUN 22 (H) 6 - 20 mg/dL   Creatinine, Ser 1.00 0.44 - 1.00 mg/dL   Calcium 8.2 (L) 8.9 - 10.3 mg/dL   Total Protein 8.5 (H) 6.5 - 8.1 g/dL   Albumin 2.5 (L) 3.5 - 5.0 g/dL   AST 21 15 - 41 U/L   ALT 13 0 - 44 U/L   Alkaline Phosphatase 80 38 - 126 U/L   Total Bilirubin 1.1 0.3 -  1.2 mg/dL   GFR calc non Af Amer >60 >60 mL/min   GFR calc Af Amer >60 >60 mL/min   Anion gap 6 5 - 15    Comment: Performed at [facility], [place] [address], [place], [place] [address]  Glucose, capillary     Status: Abnormal   Collection Time: 06/08/18  3:36 AM  Result Value Ref Range   Glucose-Capillary 198 (H) 70 - 99 mg/dL  Glucose, capillary     Status: Abnormal   Collection Time: 06/08/18  7:24 AM  Result Value Ref Range   Glucose-Capillary 155 (H) 70 - 99 mg/dL  Glucose, capillary     Status: Abnormal   Collection Time: 06/08/18 11:23 AM  Result Value Ref Range   Glucose-Capillary 225 (H) 70 - 99 mg/dL  Glucose, capillary     Status: Abnormal   Collection Time: 06/08/18  4:19 PM  Result Value Ref Range   Glucose-Capillary 184 (H) 70 - 99 mg/dL  Glucose, capillary     Status: Abnormal   Collection Time: 06/08/18  9:33 PM  Result Value Ref Range   Glucose-Capillary 133 (H) 70 - 99 mg/dL  Glucose, capillary     Status: Abnormal   Collection Time: 06/09/18  7:20 AM  Result Value Ref Range   Glucose-Capillary 129 (H) 70 - 99 mg/dL  Glucose, capillary     Status: Abnormal   Collection Time: 06/09/18 11:38 AM  Result Value Ref Range   Glucose-Capillary 199 (H) 70 - 99 mg/dL   No results found.     Medical Problem List and Plan: 1.  Functional deficits secondary to debility after multiple medical issues  -admit to inpatient rehab 2.  DVT Prophylaxis/Anticoagulation: Pharmaceutical: Lovenox 3. Pain Management: Tylenol as needed 4. Mood: LCSW to follow for evaluation and support 5. Neuropsych: This patient is not fully capable of making decisions on his own behalf. 6. Skin/Wound Care: Routine pressure relief measures 7. Fluids/Electrolytes/Nutrition: Monitor I/O. Check lytes in am. 8. COPD  with chronic hypoxic respiratory failure: oxygen dependent--uses 4 L at baseline.   -IS, OOB to help keep secretions moving 9. OSA/OHS: Has not used CPAP in about 2 years  (damaged).  Now requiring BIPAP. Will add nebs prn. 10.  Acute on chronic CHF:   Low-salt diet.  On Coreg, statin and torsemide.  May need LFTs checked every 2 months.    -check CXR  -daily weights 11. Acute on chronic anemia: Improved post transfusion.  Now back on ASA--monitor with serial checks.  12.  Elevated ammonia levels: Resolved with lactulose enemas.  Will recheck ammonia levels in a.m. 13.  Multifactorial encephalopathy: Has resolved--does not want cocaine abuse discussed with her daughter 62. T2DM: Monitor BS ac/hs. Continue Levemir with SSI. Intake variable --will continue to titrate as indicated.   Post Admission Physician Evaluation: 1. Functional deficits secondary  to debility. 2. Patient is admitted to receive collaborative, interdisciplinary care between the physiatrist, rehab nursing staff, and therapy team. 3. Patient's level of medical complexity and substantial therapy needs in context of that medical necessity cannot be provided at a lesser intensity of care such as a SNF. 4. Patient has experienced substantial functional loss from his/her baseline which was documented above under the "Functional History" and "Functional Status" headings.  Judging by the patient's diagnosis, physical exam, and functional history, the patient has potential for functional progress which will result in measurable gains while on inpatient rehab.  These gains will be of substantial and practical use upon discharge  in facilitating mobility and self-care at the household level. 5. Physiatrist will provide 24 hour management of medical needs as well as oversight of the therapy plan/treatment and provide guidance as appropriate regarding the interaction of the two. 6. The Preadmission Screening has been reviewed and patient status is unchanged unless otherwise stated above. 7. 24 hour rehab nursing will assist with bladder management, bowel management, safety, skin/wound care, disease management,  medication administration, pain management and patient education  and help integrate therapy concepts, techniques,education, etc. 8. PT will assess and treat for/with: Lower extremity strength, range of motion, stamina, balance, functional mobility, safety, adaptive techniques and equipment, NMR, activity tolerance/breathing techniques, pain mgt.   Goals are: supervision. 9. OT will assess and treat for/with: ADL's, functional mobility, safety, upper extremity strength, adaptive techniques and equipment, NMR, pain control, activity tolerance.   Goals are: supervision. Therapy may proceed with showering this patient. 10. SLP will assess and treat for/with: speech, cognition.  Goals are: supervision. 11. Case Management and Social Worker will assess and treat for psychological issues and discharge planning. 12. Team conference will be held weekly to assess progress toward goals and to determine barriers to discharge. 13. Patient will receive at least 3 hours of therapy per day at least 5 days per week. 14. ELOS: 16-19 days       15. Prognosis:  excellent   I have personally performed a face to face diagnostic evaluation of this patient and formulated the key components of the plan.  Additionally, I have personally reviewed laboratory data, imaging studies, as well as relevant notes and concur with the physician assistant's documentation above.  Meredith Staggers, MD, Mellody Drown    Bary Leriche, PA-C 06/09/2018

## 2018-06-09 NOTE — Progress Notes (Signed)
Patient arrived to unit via  bed. Denies pain at time. Patient oriented to room and unit. Patient accompanied by family.

## 2018-06-09 NOTE — Progress Notes (Signed)
Inpatient Rehabilitation Admissions Coordinator  I met with pt at bedside with her friend, [name]. Both in agreement to admit to CIR today. I contacted Dr. Earnest Conroy and she is in agreement to d/c to CIR. RN and RN CM made aware. I will make the arrangements to admit today.  Danne Baxter, RN, MSN Rehab Admissions Coordinator [phone] 06/09/2018 11:58 AM

## 2018-06-09 NOTE — H&P (Signed)
Physical Medicine and Rehabilitation Admission H&P        Chief Complaint  Patient presents with  . Debility         HPI:  Tamara Silva is a 58 year old female with history of T2DM, COPD with chronic hypoxic respiratory failure- 4L oxygen dependent, OSA- no CPAP x2 years, anemia, morbid obesity-BMI 50; who was admitted to [facility] on 05/29/2018 with generalized weakness, lethargy, acute on chronic anemia with hemoglobin 5.7 and slurred speech.  Family reported issues with confusion for a few days prior to admission.  Ammonia levels were elevated 94 and UDS positive for cocaine.  She was treated with diuretics for acute on chronic biventricular heart failure, placed on BiPAP and transfused with 2 units packed red blood cells.  Cardiology consulted for input and recommended holding aspirin and paracentral till source of bleeding found.  CT head done revealing right acute to subacute occipitoparietal infarct therefore she was transferred to [facility] for management.  Neurology felt stroke most likely cardioembolic but patient not a good long-term AC candidate due to chronic anemia.  To add ASA back once H&H stable.  2D echo with EF of 45%.   She was weaned from BiPAP to CPAP however has been noncompliant with use.  On 1/5, she did have progressive hypoxia with episode of unresponsiveness and generalized seizure type activity requiring intubation.  She was loaded with Keppra and follow-up CT head was negative for extension of the hemorrhage.  She tolerated extubation to BiPAP on 1/6 and was started on dysphagia 3 diet.  Acute on chronic CHF improving and IV Lasix transitioned to Demadex.  Respiratory failure felt to be multifactorial due to OHS, sleep apnea and COPD.  Therapy initiated and patient noted to have issues with delayed processing, poor safety awareness, deficits in mobility and ADL tasks.  CIR recommended due to functional decline     Review of Systems  Unable to perform ROS: Mental acuity    Eyes: Negative for blurred vision.  Respiratory: Positive for cough and wheezing.   Cardiovascular: Negative for chest pain.  Gastrointestinal: Negative for heartburn.  Musculoskeletal: Positive for myalgias.  Neurological: Positive for sensory change and focal weakness.  Psychiatric/Behavioral: Positive for memory loss.            Past Medical History:  Diagnosis Date  . Asthma    . CAD (coronary artery disease)    . Chronic combined systolic (congestive) and diastolic (congestive) heart failure ([place])    . Chronic iron deficiency anemia    . Diabetes mellitus without complication ([place])    . DOE (dyspnea on exertion)    . Edema of both legs    . Hypertension    . Pulmonary emboli ([place]) 2014         Past Surgical History:  Procedure Laterality Date  . COLONOSCOPY WITH PROPOFOL N/A 08/03/2013    Procedure: COLONOSCOPY WITH PROPOFOL;  Surgeon: Jeryl Columbia, MD;  Location: [facility] ENDOSCOPY;  Service: Endoscopy;  Laterality: N/A;  . CORONARY ANGIOPLASTY WITH STENT PLACEMENT        CAD in 2006 x 2 and 2009 2 more- place din REx in [facility] [place] [facility]  . CORONARY ANGIOPLASTY WITH STENT PLACEMENT   10/07/2013    Xience [place] DES 2.75  mm x 15  mm  . ESOPHAGOGASTRODUODENOSCOPY (EGD) WITH PROPOFOL N/A 08/03/2013    Procedure: ESOPHAGOGASTRODUODENOSCOPY (EGD) WITH PROPOFOL;  Surgeon: Jeryl Columbia, MD;  Location: [facility] ENDOSCOPY;  Service: Endoscopy;  Laterality: N/A;  . LEFT HEART CATHETERIZATION WITH CORONARY ANGIOGRAM N/A 10/07/2013    Procedure: LEFT HEART CATHETERIZATION WITH CORONARY ANGIOGRAM;  Surgeon: Leonie Man, MD;  Location: [facility] CATH LAB;  Service: Cardiovascular;  Laterality: N/A;         Family History  Problem Relation Age of Onset  . Stroke Mother    . Hypertension Mother    . Colon cancer Father    . Diabetes Father    . Heart attack Father    . Sarcoidosis Other    . Breast cancer Paternal Aunt    . Emphysema Brother    . Lung disease Brother          Unknown type,  3 brothers, one with liver and lung disease  . Asthma Paternal Aunt        Social History: Lives with boyfriend. Has had decline in activity tolerance since last fall.  She  reports that she quit smoking about 4 years ago. Her smoking use included cigarettes. She has a 10.00 pack-year smoking history. She has never used smokeless tobacco. She reports that she does not drink alcohol or use drugs.           Allergies  Allergen Reactions  . Metolazone Other (See Comments)      Dizziness and falling  . Plavix [Clopidogrel Bisulfate] Other (See Comments)      High PRU's - non-responder  . Ibuprofen        Patient stated she can not take ibuprofen.   . Nsaids Other (See Comments)      "I do not take NSAIDs d/t interfering with other meds"          Medications Prior to Admission  Medication Sig Dispense Refill  . ACCU-CHEK AVIVA PLUS test strip USE 1 STRIP TO CHECK GLUCOSE 3 TIMES DAILY 100 each 0  . ACCU-CHEK FASTCLIX LANCETS MISC 1 applicator by Does not apply route 4 (four) times daily -  before meals and at bedtime. 100 each 11  . ACCU-CHEK SOFTCLIX LANCETS lancets USE 1 LANCET TO CHECK GLUCOSE 3 TIMES DAILY 100 each 10  . aspirin 81 MG tablet Take 1 tablet (81 mg total) by mouth daily.      . Blood Glucose Monitoring Suppl (ACCU-CHEK AVIVA) device Use as instructed 3 times daily before meals. 1 each 0  . carvedilol (COREG) 12.5 MG tablet TAKE 1 TABLET BY MOUTH 2 TIMES A DAY WITH A MEAL **PT NEEDS TO SCHEDULE AN APPT WITH OUR OFFICE AT [id number] FOR FUTURE REFILLS 60 tablet 1  . fluticasone (FLONASE) 50 MCG/ACT nasal spray INSTILL 2 SPRAYS IN EACH NOSTRIL TWICE DAILY. 1 g 1  . furosemide (LASIX) 40 MG tablet Take 1.5 tablets (60 mg total) by mouth 2 (two) times daily. 90 tablet 0  . Insulin Pen Needle (TRUEPLUS PEN NEEDLES) 32G X 4 MM MISC Use three times daily to inject insulin 100 each 11  . INSULIN SYRINGE .5CC/29G 29G X 1/2" 0.5 ML MISC Use as instructed by physician 100 each 12   . ipratropium-albuterol (DUONEB) 0.5-2.5 (3) MG/3ML SOLN Use nebulizer to inhale contents of one vial every 6 hours as needed for shortness of breath, wheezing 360 mL 11  . LEVEMIR 100 UNIT/ML injection INJECT 45 UNITS SUBCUTANEOUSLY EVERY NIGHT AT BEDTIME *MUST HAVE OFFICE VISIT FOR REFILLS* 2 vial 0  . losartan (COZAAR) 50 MG tablet Take 1 tablet (50 mg total) by mouth daily. 90 tablet 1  .  nitroGLYCERIN (NITROSTAT) 0.4 MG SL tablet Place 1 tablet (0.4 mg total) under the tongue every 5 (five) minutes as needed for chest pain. 25 tablet 11  . NOVOLOG FLEXPEN 100 UNIT/ML FlexPen INJECT 15-20 UNITS SUBCUTANEOUSLY 3 TIMES DAILY WITH MEALS. **PATIENT MUST HAVE OFFICE VISIT FOR FUTURE REFILLS** 2 pen 0  . omeprazole (PRILOSEC) 40 MG capsule TAKE 1 CAPSULE BY MOUTH TWICE DAILY. 60 capsule 1  . polyethylene glycol (MIRALAX / GLYCOLAX) packet Take 17 g by mouth daily. 30 each 6  . prasugrel (EFFIENT) 10 MG TABS tablet TAKE ONE TABLET BY MOUTH DAILY. 30 tablet 0  . rosuvastatin (CRESTOR) 10 MG tablet Take 1 tablet (10 mg total) by mouth daily. 90 tablet 2  . spironolactone (ALDACTONE) 25 MG tablet Take 1 tablet (25 mg total) by mouth daily. 90 tablet 3  . iron polysaccharides (NIFEREX) 150 MG capsule Take 1 capsule (150 mg total) by mouth daily. (Patient not taking: Reported on 05/29/2018) 30 capsule 11  . prazosin (MINIPRESS) 5 MG capsule TAKE 1 CAPSULE BY MOUTH EVERY NIGHT AT BEDTIME FOR NIGHTMARES. (Patient not taking: Reported on 05/29/2018) 30 capsule 0  . PROAIR HFA 108 (90 Base) MCG/ACT inhaler INHALE 2 PUFFS BY MOUTH 4 TIMES DAILY AS NEEDED (Patient not taking: Reported on 05/29/2018) 8.5 each 2  . senna (SENOKOT) 8.6 MG TABS tablet Take 1 tablet (8.6 mg total) by mouth 2 (two) times daily. (Patient not taking: Reported on 05/29/2018) 120 each 0      Drug Regimen Review  Drug regimen was reviewed and remains appropriate with no significant issues identified   Home: Home Living Family/patient  expects to be discharged to:: Private residence Living Arrangements: Spouse/significant other Available Help at Discharge: Family, Available 24 hours/day Type of Home: House Home Access: Stairs to enter [employer] of Steps: 2 Home Layout: One level Bathroom Shower/Tub: (pateitn does not use) Bathroom Toilet: Handicapped height Bathroom Accessibility: Yes Home Equipment: [place] - single point, Bedside commode Additional Comments: PLOF reviewed with [name]  Lives With: Significant other([name])   Functional History: Prior Function Level of Independence: Needs assistance Gait / Transfers Assistance Needed: ambulates short distances with RW per [name] ADL's / Homemaking Assistance Needed: [name] helps sponge bathe as she stands at side of bed   Functional Status:  Mobility: Bed Mobility Overal bed mobility: Needs Assistance Bed Mobility: Supine to Sit Supine to sit: Min guard, HOB elevated Sit to supine: Mod assist, HOB elevated General bed mobility comments: Increased time and effort; cues to attend to task throughout as pt easily distracted  Transfers Overall transfer level: Needs assistance Equipment used: Rolling walker (2 wheeled) Transfers: Sit to/from Stand Sit to Stand: Mod assist, From elevated surface Stand pivot transfers: +2 physical assistance, Min assist General transfer comment: Initially stood from bed with bilateral HHA and modA+2; stood from [facility] with min guard, heavy reliance on BUE support to push into standing Ambulation/Gait Ambulation/Gait assistance: Min assist, +2 safety/equipment, Mod assist Gait Distance (Feet): 12 Feet Assistive device: Rolling walker (2 wheeled) Gait Pattern/deviations: Leaning posteriorly, Step-to pattern General Gait Details: Slow, unsteady amb with RW and minA+2 for balance and chair follow; pt with very poor attention, intermittent self-corrected knee buckling, and abrupt attempt to sit requiring assist+3 for safety and  modA for descent to recliner. SpO2 down to 78% on 2L O2 [place], returning to >88% on 4L O2 with rest and deep breathing Gait velocity: Decreased Gait velocity interpretation: <1.31 ft/sec, indicative of household ambulator   ADL: ADL  Overall ADL's : Needs assistance/impaired Eating/Feeding: Minimal assistance, Sitting Eating/Feeding Details (indicate cue type and reason): pt request applesauce, but when it is handed to her, she passively stares at it, and at times, refuses to self feed.  Cues provided to perform on her own.  Once she gets started, she is able to continue task with supervision  Grooming: Wash/dry face, Sitting, Set up Grooming Details (indicate cue type and reason): assist to initiate and for thoroughness  Upper Body Bathing: Maximal assistance, Sitting Lower Body Bathing: Maximal assistance, Sit to/from stand Upper Body Dressing : Moderate assistance, Sitting Upper Body Dressing Details (indicate cue type and reason): requires increased assist due to poor sitting balance Lower Body Dressing: Total assistance, Sit to/from stand Toilet Transfer: +2 for physical assistance, Minimal assistance, Stand-pivot, BSC Toileting- Clothing Manipulation and Hygiene: Total assistance, Sit to/from stand Functional mobility during ADLs: Moderate assistance, Cueing for safety, Cueing for sequencing, Rolling walker General ADL Comments: increased time for any task and requires slow speech   Cognition: Cognition Overall Cognitive Status: Impaired/Different from baseline Arousal/Alertness: Awake/alert Orientation Level: Oriented X4 Attention: Focused Focused Attention: Impaired Focused Attention Impairment: Verbal basic, Functional basic Memory: Impaired Memory Impairment: Decreased recall of new information Awareness: Appears intact Problem Solving: Impaired Problem Solving Impairment: Verbal basic Executive Function: Organizing, Self Monitoring, Self Correcting Organizing:  Impaired Organizing Impairment: Verbal basic Self Monitoring: Impaired Self Monitoring Impairment: Verbal basic Self Correcting: Impaired Self Correcting Impairment: Verbal basic Behaviors: Perseveration, Confabulation Safety/Judgment: Impaired Cognition Arousal/Alertness: Awake/alert Behavior During Therapy: WFL for tasks assessed/performed Overall Cognitive Status: Impaired/Different from baseline Area of Impairment: Attention, Memory, Following commands, Safety/judgement, Awareness, Problem solving Current Attention Level: Sustained, Focused Memory: Decreased short-term memory Following Commands: Follows one step commands with increased time Safety/Judgement: Decreased awareness of safety, Decreased awareness of deficits Awareness: Intellectual, Emergent Problem Solving: Slow processing, Decreased initiation, Difficulty sequencing, Requires verbal cues, Requires tactile cues General Comments: Sister present and reports pt speech seems slower than usual (sister reports she has not seen pt in a while, so not 100% what baseline). Pt with poor attention, difficulty dividing attention. Slow to initiate movement. Requires frequent cues for redirection. Cues for simple problem solving     Blood pressure 130/70, pulse 73, temperature 98 F (36.7 C), temperature source Oral, resp. rate (!) 86, height 5' 7.5" (1.715 m), weight (!) 138.7 kg, last menstrual period 07/28/2013, SpO2 94 %. Physical Exam  Nursing note and vitals reviewed. Constitutional: She is oriented to person, place, and time. She appears well-developed and well-nourished. She appears lethargic. She is easily aroused.  Morbidly obese female  HENT:  Head: Normocephalic.  Eyes: Pupils are equal, round, and reactive to light.  Respiratory: She has wheezes (audible).  Occasional productive cough  Musculoskeletal:        General: Edema present.  Neurological: She is oriented to person, place, and time and easily aroused. She  appears lethargic. Coordination normal.  Speech slurred and more delayed today--reports fatigue. Very slow to engage. Able to follow simple motor commands. UE 2-3/5 prox to distal. LE: 2/5 HF, KE and 3/5 ADF/PF. Decreased LT distally.   Skin: Skin is warm.  Skin tears under breasts and dry flaky skin on abdomen and BLE. Healed lesions on extremities.   Psychiatric:  Pleasant and cooperative      Lab Results Last 48 Hours        Results for orders placed or performed during the hospital encounter of 05/29/18 (from the past 48 hour(s))  Glucose, capillary  Status: Abnormal    Collection Time: 06/07/18  3:53 PM  Result Value Ref Range    Glucose-Capillary 159 (H) 70 - 99 mg/dL  Glucose, capillary     Status: Abnormal    Collection Time: 06/07/18  8:21 PM  Result Value Ref Range    Glucose-Capillary 160 (H) 70 - 99 mg/dL  Glucose, capillary     Status: Abnormal    Collection Time: 06/08/18 12:36 AM  Result Value Ref Range    Glucose-Capillary 169 (H) 70 - 99 mg/dL  CBC     Status: Abnormal    Collection Time: 06/08/18  3:22 AM  Result Value Ref Range    WBC 8.1 4.0 - 10.5 K/uL    RBC 3.71 (L) 3.87 - 5.11 MIL/uL    Hemoglobin 7.9 (L) 12.0 - 15.0 g/dL    HCT 31.0 (L) 36.0 - 46.0 %    MCV 83.6 80.0 - 100.0 fL    MCH 21.3 (L) 26.0 - 34.0 pg    MCHC 25.5 (L) 30.0 - 36.0 g/dL    RDW 32.0 (H) 11.5 - 15.5 %    Platelets 349 150 - 400 K/uL    nRBC 0.9 (H) 0.0 - 0.2 %      Comment: Performed at [facility], [address], [place], [place] [address]  Comprehensive metabolic panel     Status: Abnormal    Collection Time: 06/08/18  3:22 AM  Result Value Ref Range    Sodium 139 135 - 145 mmol/L    Potassium 3.7 3.5 - 5.1 mmol/L    Chloride 92 (L) 98 - 111 mmol/L    CO2 41 (H) 22 - 32 mmol/L    Glucose, Bld 195 (H) 70 - 99 mg/dL    BUN 22 (H) 6 - 20 mg/dL    Creatinine, Ser 1.00 0.44 - 1.00 mg/dL    Calcium 8.2 (L) 8.9 - 10.3 mg/dL    Total Protein 8.5 (H) 6.5 - 8.1 g/dL     Albumin 2.5 (L) 3.5 - 5.0 g/dL    AST 21 15 - 41 U/L    ALT 13 0 - 44 U/L    Alkaline Phosphatase 80 38 - 126 U/L    Total Bilirubin 1.1 0.3 - 1.2 mg/dL    GFR calc non Af Amer >60 >60 mL/min    GFR calc Af Amer >60 >60 mL/min    Anion gap 6 5 - 15      Comment: Performed at [facility], [place] [address] [place], [place] [address]  Glucose, capillary     Status: Abnormal    Collection Time: 06/08/18  3:36 AM  Result Value Ref Range    Glucose-Capillary 198 (H) 70 - 99 mg/dL  Glucose, capillary     Status: Abnormal    Collection Time: 06/08/18  7:24 AM  Result Value Ref Range    Glucose-Capillary 155 (H) 70 - 99 mg/dL  Glucose, capillary     Status: Abnormal    Collection Time: 06/08/18 11:23 AM  Result Value Ref Range    Glucose-Capillary 225 (H) 70 - 99 mg/dL  Glucose, capillary     Status: Abnormal    Collection Time: 06/08/18  4:19 PM  Result Value Ref Range    Glucose-Capillary 184 (H) 70 - 99 mg/dL  Glucose, capillary     Status: Abnormal    Collection Time: 06/08/18  9:33 PM  Result Value Ref Range    Glucose-Capillary 133 (  H) 70 - 99 mg/dL  Glucose, capillary     Status: Abnormal    Collection Time: 06/09/18  7:20 AM  Result Value Ref Range    Glucose-Capillary 129 (H) 70 - 99 mg/dL  Glucose, capillary     Status: Abnormal    Collection Time: 06/09/18 11:38 AM  Result Value Ref Range    Glucose-Capillary 199 (H) 70 - 99 mg/dL      Imaging Results (Last 48 hours)  No results found.           Medical Problem List and Plan: 1.  Functional deficits secondary to metabolic encephalopathy, right occipital-parietal infact and debility after multiple medical issues             -admit to inpatient rehab 2.  DVT Prophylaxis/Anticoagulation: Pharmaceutical: Lovenox 3. Pain Management: Tylenol as needed 4. Mood: LCSW to follow for evaluation and support 5. Neuropsych: This patient is not fully capable of making decisions on his own behalf. 6. Skin/Wound Care:  Routine pressure relief measures 7. Fluids/Electrolytes/Nutrition: Monitor I/O. Check lytes in am. 8. COPD with chronic hypoxic respiratory failure: oxygen dependent--uses 4 L at baseline.              -IS, OOB to help keep secretions moving 9. OSA/OHS: Has not used CPAP in about 2 years (damaged).  Now requiring BIPAP. Will add nebs prn. 10.  Acute on chronic CHF:   Low-salt diet.  On Coreg, statin and torsemide.  May need LFTs checked every 2 months.               -check CXR             -daily weights 11. Acute on chronic anemia: Improved post transfusion.  Now back on ASA--monitor with serial checks.  12.  Elevated ammonia levels: Resolved with lactulose enemas.  Will recheck ammonia levels in a.m. 13.  Multifactorial encephalopathy: Has resolved--does not want cocaine abuse discussed with her daughter 62. T2DM: Monitor BS ac/hs. Continue Levemir with SSI. Intake variable --will continue to titrate as indicated.    Post Admission Physician Evaluation: 1. Functional deficits secondary  to debility, CVA, encephalopathy 2. Patient is admitted to receive collaborative, interdisciplinary care between the physiatrist, rehab nursing staff, and therapy team. 3. Patient's level of medical complexity and substantial therapy needs in context of that medical necessity cannot be provided at a lesser intensity of care such as a SNF. 4. Patient has experienced substantial functional loss from his/her baseline which was documented above under the "Functional History" and "Functional Status" headings.  Judging by the patient's diagnosis, physical exam, and functional history, the patient has potential for functional progress which will result in measurable gains while on inpatient rehab.  These gains will be of substantial and practical use upon discharge  in facilitating mobility and self-care at the household level. 5. Physiatrist will provide 24 hour management of medical needs as well as oversight of the  therapy plan/treatment and provide guidance as appropriate regarding the interaction of the two. 6. The Preadmission Screening has been reviewed and patient status is unchanged unless otherwise stated above. 7. 24 hour rehab nursing will assist with bladder management, bowel management, safety, skin/wound care, disease management, medication administration, pain management and patient education  and help integrate therapy concepts, techniques,education, etc. 8. PT will assess and treat for/with: Lower extremity strength, range of motion, stamina, balance, functional mobility, safety, adaptive techniques and equipment, NMR, activity tolerance/breathing techniques, pain mgt.   Goals are: supervision.  9. OT will assess and treat for/with: ADL's, functional mobility, safety, upper extremity strength, adaptive techniques and equipment, NMR, pain control, activity tolerance.   Goals are: supervision. Therapy may proceed with showering this patient. 10. SLP will assess and treat for/with: speech, cognition.  Goals are: supervision. 11. Case Management and Social Worker will assess and treat for psychological issues and discharge planning. 12. Team conference will be held weekly to assess progress toward goals and to determine barriers to discharge. 13. Patient will receive at least 3 hours of therapy per day at least 5 days per week. 14. ELOS: 16-19 days       15. Prognosis:  excellent     I have personally performed a face to face diagnostic evaluation of this patient and formulated the key components of the plan.  Additionally, I have personally reviewed laboratory data, imaging studies, as well as relevant notes and concur with the physician assistant's documentation above.   Meredith Staggers, MD, Mellody Drown     Bary Leriche, PA-C 06/09/2018  The patient's status has not changed. The original post admission physician evaluation remains appropriate, and any changes from the pre-admission screening or  documentation from the acute chart are noted above.  Meredith Staggers, MD 06/09/2018

## 2018-06-09 NOTE — PMR Pre-admission (Signed)
PMR Admission Coordinator Pre-Admission Assessment  Patient: Tamara Silva is an 58 y.o., female MRN: [id number] DOB: 02-06-1961 Height: 5' 7.5" (171.5 cm) Weight: (!) 138.7 kg              Insurance Information HMO:     PPO:      PCP:      IPA:      80/20:      OTHER:  PRIMARY: Medicaid [place] Access      Policy#: [id number]      Benefits:  Phone #: passport one online     Name: 06/08/2018 Eff. Date: active 06/08/2018      MADCY active  Medicaid Application Date:       Case Manager:  Disability Application Date:       Case Worker:   Emergency Contact Information Contact Information    Name Relation Home Work Mobile   [place] Daughter [phone]  [phone]   Davis,Sally Sister [phone]       Current Medical History  Patient Admitting Diagnosis: Debility  HPI: Tamara Silva is a 58 y.o. female with history of T2DM, asthma, COPD with chronic hypoxic respiratory failure-Oxygen dependent, OSA--noncompliant with CPAP, anemia, morbid obesity- BMI 50 who was admitted to [facility] on 05/29/18 with generalized weakness, lethargy, acute on chronic anemia with Hgb 5.7 and slurred speech.   Family reported confusion for couple of days PTA. Ammonia level elevated at 94 and UDS positive for cocaine. She was treated with diuretics for acute on chronic biventricular HF, placed on  BIPAP and transfused with 2 units PRBC. Cardiology consulted for input and recommended holding ASA and parasugel till source of bleeding found.  CT head done 1/1 reviewed, showing right posterior brain infarct.  Due to lethargy and revealed acute to subacute right occipitoparietal infarct with small areas of cytotoxic edema and she was transferred to [facility] for management.   Neurology felt that stroke most likely cardioembolic but patient not good long term AC candidate due to chronic anemia and to add ASA back once H/H stable.   She was weaned to CPAP but has been noncompliant. She developed mental status  changes on 1/5 and placed on BIPAP but was non-compliant with use. Later that day she developed progressive hypoxia with unresponsiveness and generalized seizure type activity requiring intubation. She was loaded with Keppra and follow up CT head without extension or hemorrhage.  She tolerated extubation to BIPAP 1/6 and started on dysphagia 3, thins. Acute on chronic CHF improving--lasix transitioned to [facility] and respiratory failure felt to multifactorial due to OHS, sleep apnea and COPD. Therapy evaluations completed revealing delayed processing with impulsivity, balance deficits and difficult with mobility    .Complete NIHSS TOTAL: 0    Past Medical History  Past Medical History:  Diagnosis Date  . Asthma   . CAD (coronary artery disease)   . Chronic combined systolic (congestive) and diastolic (congestive) heart failure ([place])   . Chronic iron deficiency anemia   . Diabetes mellitus without complication ([place])   . DOE (dyspnea on exertion)   . Edema of both legs   . Hypertension   . Pulmonary emboli ([place]) 2014    Family History  family history includes Asthma in her paternal aunt; Breast cancer in her paternal aunt; Colon cancer in her father; Diabetes in her father; Emphysema in her brother; Heart attack in her father; Hypertension in her mother; Lung disease in her brother; Sarcoidosis in an other family member; Stroke in her mother.  Prior Rehab/Hospitalizations:  Has the patient had major surgery during 100 days prior to admission? No  Current Medications   Current Facility-Administered Medications:  .  0.9 %  sodium chloride infusion, , Intravenous, PRN, Asencion Noble, MD, Stopped at 06/06/18 1807 .  acetaminophen (TYLENOL) tablet 650 mg, 650 mg, Oral, Q6H PRN, Sherry Ruffing, Marissa M, MD, 650 mg at 06/03/18 1300 .  albuterol (PROVENTIL) (2.5 MG/3ML) 0.083% nebulizer solution 2.5 mg, 2.5 mg, Nebulization, Q2H PRN, Sherry Ruffing, Marissa M, MD, 2.5 mg at 06/03/18 1400 .  aspirin  chewable tablet 81 mg, 81 mg, Per NG tube, Daily, Asencion Noble, MD, 81 mg at 06/09/18 0956 .  carvedilol (COREG) tablet 3.125 mg, 3.125 mg, Oral, BID WC, Asencion Noble, MD, 3.125 mg at 06/09/18 0956 .  chlorhexidine gluconate (MEDLINE KIT) (PERIDEX) 0.12 % solution 15 mL, 15 mL, Mouth Rinse, BID, Asencion Noble, MD, 15 mL at 06/08/18 2134 .  [START ON 06/10/2018] ferumoxytol (FERAHEME) 510 mg in sodium chloride 0.9 % 100 mL IVPB, 510 mg, Intravenous, Weekly, Asencion Noble, MD .  Gerhardt's butt cream, , Topical, TID, Asencion Noble, MD .  heparin injection 5,000 Units, 5,000 Units, Subcutaneous, Q8H, Matcha, Anupama, MD, 5,000 Units at 06/09/18 0508 .  hydrALAZINE (APRESOLINE) injection 5 mg, 5 mg, Intravenous, Q6H PRN, Asencion Noble, MD, 5 mg at 05/31/18 0107 .  insulin aspart (novoLOG) injection 0-9 Units, 0-9 Units, Subcutaneous, TID WC, Mauri Brooklyn, MD, 2 Units at 06/09/18 1227 .  insulin detemir (LEVEMIR) injection 15 Units, 15 Units, Subcutaneous, Daily, Asencion Noble, MD, 15 Units at 06/09/18 0955 .  multivitamin liquid 15 mL, 15 mL, Oral, Daily, Asencion Noble, MD, 15 mL at 06/08/18 0818 .  ondansetron (ZOFRAN) tablet 4 mg, 4 mg, Oral, Q6H PRN **OR** ondansetron (ZOFRAN) injection 4 mg, 4 mg, Intravenous, Q6H PRN, Asencion Noble, MD, 4 mg at 05/29/18 1850 .  pantoprazole (PROTONIX) EC tablet 40 mg, 40 mg, Oral, Q1200, Asencion Noble, MD, 40 mg at 06/09/18 1226 .  rosuvastatin (CRESTOR) tablet 40 mg, 40 mg, Oral, q1800, Lelon Perla, MD, 40 mg at 06/08/18 1745 .  torsemide (DEMADEX) tablet 60 mg, 60 mg, Oral, Daily, Sherry Ruffing, Marissa M, MD, 60 mg at 06/09/18 6578  Patients Current Diet:  Diet Order            DIET DYS 3 Room service appropriate? Yes; Fluid consistency: Thin  Diet effective now              Precautions / Restrictions Precautions Precautions: Fall Precaution Comments: uses 2L O2 at baseline Restrictions Weight  Bearing Restrictions: No   Has the patient had 2 or more falls or a fall with injury in the past year?No  Prior Activity Level Limited Community (1-2x/wk): sedentary at baseline; [name] assists with adls supervision with RW at baseline for short distances. [name] assists with sponge baths at bedside. Does not use tub/shower due to mold issues. Uses ) 2 at 4 liters  pta. Has not used home CPAP for 1 year due to malfunctioning.  Home Assistive Devices / Equipment Home Assistive Devices/Equipment: None Home Equipment: Cane - single point, Bedside commode  Prior Device Use: Indicate devices/aids used by the patient prior to current illness, exacerbation or injury? Walker  Prior Functional Level Prior Function Level of Independence: Needs assistance Gait / Transfers Assistance Needed: ambulates short distances with RW per [name] ADL's / Homemaking Assistance Needed: [name] helps sponge bathe as she stands at side of  bed  Self Care: Did the patient need help bathing, dressing, using the toilet or eating?  Needed some help  Indoor Mobility: Did the patient need assistance with walking from room to room (with or without device)? Needed some help  Stairs: Did the patient need assistance with internal or external stairs (with or without device)? Needed some help  Functional Cognition: Did the patient need help planning regular tasks such as shopping or remembering to take medications? Needed some help  Current Functional Level Cognition  Arousal/Alertness: Awake/alert Overall Cognitive Status: Impaired/Different from baseline Current Attention Level: Sustained, Focused Orientation Level: Oriented X4 Following Commands: Follows one step commands with increased time Safety/Judgement: Decreased awareness of safety, Decreased awareness of deficits General Comments: Sister present and reports pt speech seems slower than usual (sister reports she has not seen pt in a while, so not 100% what  baseline). Pt with poor attention, difficulty dividing attention. Slow to initiate movement. Requires frequent cues for redirection. Cues for simple problem solving Attention: Focused Focused Attention: Impaired Focused Attention Impairment: Verbal basic, Functional basic Memory: Impaired Memory Impairment: Decreased recall of new information Awareness: Appears intact Problem Solving: Impaired Problem Solving Impairment: Verbal basic Executive Function: Organizing, Self Monitoring, Self Correcting Organizing: Impaired Organizing Impairment: Verbal basic Self Monitoring: Impaired Self Monitoring Impairment: Verbal basic Self Correcting: Impaired Self Correcting Impairment: Verbal basic Behaviors: Perseveration, Confabulation Safety/Judgment: Impaired    Extremity Assessment (includes Sensation/Coordination)  Upper Extremity Assessment: Generalized weakness RUE Deficits / Details: decreased fine motor/coordination noted during finger opposition; grossly 4/5 strength throughout RUE Coordination: decreased fine motor  Lower Extremity Assessment: Overall WFL for tasks assessed RLE Deficits / Details: general weakness gross flexion weaker than gross ext. RLE Coordination: decreased fine motor LLE Deficits / Details: general weakness LLE Coordination: decreased fine motor    ADLs  Overall ADL's : Needs assistance/impaired Eating/Feeding: Minimal assistance, Sitting Eating/Feeding Details (indicate cue type and reason): pt request applesauce, but when it is handed to her, she passively stares at it, and at times, refuses to self feed.  Cues provided to perform on her own.  Once she gets started, she is able to continue task with supervision  Grooming: Wash/dry face, Sitting, Set up Grooming Details (indicate cue type and reason): assist to initiate and for thoroughness  Upper Body Bathing: Maximal assistance, Sitting Lower Body Bathing: Maximal assistance, Sit to/from stand Upper Body  Dressing : Moderate assistance, Sitting Upper Body Dressing Details (indicate cue type and reason): requires increased assist due to poor sitting balance Lower Body Dressing: Total assistance, Sit to/from stand Toilet Transfer: +2 for physical assistance, Minimal assistance, Stand-pivot, BSC Toileting- Clothing Manipulation and Hygiene: Total assistance, Sit to/from stand Functional mobility during ADLs: Moderate assistance, Cueing for safety, Cueing for sequencing, Rolling walker General ADL Comments: increased time for any task and requires slow speech    Mobility  Overal bed mobility: Needs Assistance Bed Mobility: Supine to Sit Supine to sit: Min guard, HOB elevated Sit to supine: Mod assist, HOB elevated General bed mobility comments: Increased time and effort; cues to attend to task throughout as pt easily distracted     Transfers  Overall transfer level: Needs assistance Equipment used: Rolling walker (2 wheeled) Transfers: Sit to/from Stand Sit to Stand: Mod assist, From elevated surface Stand pivot transfers: +2 physical assistance, Min assist General transfer comment: Initially stood from bed with bilateral HHA and modA+2; stood from [facility] with min guard, heavy reliance on BUE support to push into standing  Ambulation / Gait / Stairs / Wheelchair Mobility  Ambulation/Gait Ambulation/Gait assistance: Min assist, +2 safety/equipment, Mod assist Gait Distance (Feet): 12 Feet Assistive device: Rolling walker (2 wheeled) Gait Pattern/deviations: Leaning posteriorly, Step-to pattern General Gait Details: Slow, unsteady amb with RW and minA+2 for balance and chair follow; pt with very poor attention, intermittent self-corrected knee buckling, and abrupt attempt to sit requiring assist+3 for safety and modA for descent to recliner. SpO2 down to 78% on 2L O2 [place], returning to >88% on 4L O2 with rest and deep breathing Gait velocity: Decreased Gait velocity interpretation: <1.31 ft/sec,  indicative of household ambulator    Posture / Balance Dynamic Sitting Balance Sitting balance - Comments: able to maintain static sitting with min guard assist.  Pt somewhat impulsive so close min guard assist required  Balance Overall balance assessment: Needs assistance Sitting-balance support: Feet supported Sitting balance-Leahy Scale: Fair Sitting balance - Comments: able to maintain static sitting with min guard assist.  Pt somewhat impulsive so close min guard assist required  Postural control: Posterior lean Standing balance support: Bilateral upper extremity supported Standing balance-Leahy Scale: Poor Standing balance comment: requires min assist and B UE support in standing    Special needs/care consideration BiPAP/CPAP home CPAP not functioning per pt for one year. I have asked pt to have her daughter bring in the actual machine for medical team to evaluate; current I hospital settings are BIPAP dream station settings 18/5 with 4 liters bled into system managed by RRT nightly CPM n/a Continuous Drip IV n/a Dialysis n/a Life Vest n/a Oxygen )2 at 4 liters [place] during the day Special Bed n/a Trach Size n/a Wound Vac n/a Skin   McNichol, Rudi Heap, RN  Registered Nurse  WOC  Consult Note  Signed  Date of Service:  06/05/2018 9:27 AM          Signed         Show:Clear all _0 Manual_1 Template_2 Copied  Added by: _3 McNichol, Rudi Heap, RN  _4 Hover for details [place] Nurse wound consult note Reason for Consult:Patient with moisture associated skin damage (MASD), specifically intertriginous dermatitis in the bilateral intermammary, bilateral inguinal and subpannicular areas. There is evidence of incontinence associated dermatitis (IAD) with peeling in the medial thighs consistent with fungal overgrowth. She currently has a female external suction device for UI management. Her heels and sacrum are intact but are at risk for skin breakdown; prevent measures will be placed  for those areas. Wound type:Moisture (ITD and IAD) Pressure Injury POA: N/A Measurement: 0.4cm x 12cm area of ITD in the area between breasts with partial thickness tissue loss. Open, dry lesions.  Moisture collection in the subpannicular area and bilateral inguinal areas, but no breakdown. Peeling epidermis in the bilateral medial thigh areas consistent with fungal overgrowth in urinary incontinence. Wound bed: As described above Drainage (amount, consistency, odor) None Peri wound: intact, dry Dressing procedure/placement/frequency: I will today provide bilateral pressure redistribution heel boots (Prevalon) and a sacral prophylactic dressing as she is at risk for pressure injury.  She is currently on a mattress replacement with low air loss feature and is being turned and repositioned side to side. I have provided guidance for Nursing via the Orders for use of our antimicrobial wicking textile (InterDry Ag+) in the inframammary, bilateral inguinal and subpannicular areas.  We will implement use of Gerhart's Butt cream (a compounded 1:1:1 product with zinc oxide, lotrimin and hydrocortisone cream).  A systemic antifungal would expedite resolution of the fungal overgrowth in the perineal  and medial thigh areas, if you agree, please order (e.g. Diflucan).  [place] nursing team will not follow, but will remain available to this patient, the nursing and medical teams.  Please re-consult if needed. Thanks, Maudie Flakes, MSN, RN, [place], Arther Abbott  Pager# [phone]               Bowel mgmt: continent LBM 1/7 Bladder mgmt: incontinent ; external catheter Diabetic mgmt yes   Previous Home Environment Living Arrangements: Spouse/significant other  Lives With: Significant other([name]) Available Help at Discharge: Family, Available 24 hours/day Type of Home: House Home Layout: One level Home Access: Stairs to enter [employer] of Steps: 2 Bathroom Shower/Tub:  (pateitn does not use) Bathroom Toilet: Handicapped height Bathroom Accessibility: Yes How Accessible: Accessible via walker [place]: No Type of Home Care Services: [name] (if known): patietn and [name] deny [place] is in place Additional Comments: PLOF reviewed with [name]   Discharge Living Setting Plans for Discharge Living Setting: Patient's home, Lives with (comment)([name], significant other) Type of Home at Discharge: House Discharge Home Layout: One level Discharge Home Access: Stairs to enter Entrance Stairs-Rails: None Entrance Stairs-Number of Steps: 2 Discharge Bathroom Shower/Tub: Tub/shower unit(pt does not use tub) Discharge Bathroom Toilet: Handicapped height Discharge Bathroom Accessibility: Yes How Accessible: Accessible via walker Does the patient have any problems obtaining your medications?: No  Social/Family/Support Systems Patient Roles: Parent, Partner Contact Information: [name] is her significant other but her daughter, Lenward Chancellor is her NOK Anticipated Caregiver: [name] Anticipated Ambulance person Information: see above Ability/Limitations of Caregiver: reggie has no physical limitations; daughter works Careers adviser: 24/7 Discharge Plan Discussed with Primary Caregiver: Yes Is Caregiver In Agreement with Plan?: Yes Does Caregiver/Family have Issues with Lodging/Transportation while Pt is in Rehab?: No([name] stays with pt 24/7 in hospital; he does not drive)   Patient and [name] have been together for 12 years  Goals/Additional Needs Patient/Family Goal for Rehab: Supervision with PT, OT, and SLP Expected length of stay: [facility] 16 to 19 days Equipment Needs: Pt states her CPAP at home not working for one year; I have asked her to have her daughter to bring CPAP from home in Pt/Family Agrees to Admission and willing to participate: Yes Program Orientation Provided & Reviewed with Pt/Caregiver Including Roles  &  Responsibilities: Yes  Barriers to Discharge: (lack of home CPAP)  Decrease burden of Care through IP rehab admission: n/a  Possible need for SNF placement upon discharge: not anticipated  Patient Condition: This patient's medical and functional status has changed since the consult dated 06/07/2018 in which the Rehabilitation Physician determined and documented that the patient was potentially appropriate for intensive rehabilitative care in an inpatient rehabilitation facility. Issues have been addressed and update has been discussed with Dr. Naaman Plummer and patient now appropriate for inpatient rehabilitation. Will admit to inpatient rehab today.   Preadmission Screen Completed By:  Cleatrice Burke, 06/09/2018 12:30 PM ______________________________________________________________________   Discussed status with Dr. Naaman Plummer on 06/09/2018 at  1228  and received telephone approval for admission today.  Admission Coordinator:  Cleatrice Burke, time 1228 Date 06/09/2018

## 2018-06-09 NOTE — Progress Notes (Signed)
PROGRESS NOTE    Tamara Silva  [id number]:[id number]  DOB: 1960/11/19  DOA: 05/29/2018 PCP: Antony Blackbird, MD  Brief Narrative:  58 year old female, history of OSA, COPD, chronic hypoxic respiratory failure(4 L at home), iron deficiency anemia who presented on 12/30 for generalized weakness , slurred speech, and a found to have acute anemia with hgb 5.7, a right MCA/PCA watershed area stroke and a 2 cm infarct in the inferior right parietal lobe (felt to be incidental), with hypercapnic respiratory failure. Placed on BiPAP but had been tolerating it well. Patient became unresponsive, had a generalized tonic-clonic seizure, apneic, and pulselessness. CODE BLUE was called. Patient was intubated and transferred to ICU. Now extubated and on nightly BIPAP.   Subjective:  Patient resting comfortably and now down to 4L nasal cannula 9 which is her baseline). Was supposed to be using CPAP at night but states the machine has been broke for at least a year now.  She has slurred speech and still requiring lot of assistance to walk and balance  Patient's significant other is bedside and feels her mental status is back to baseline.  She is able to move all her extremities    Objective: Vitals:   06/09/18 0032 06/09/18 0435 06/09/18 0651 06/09/18 0741  BP: (!) 151/70 126/66  130/70  Pulse: 76 73    Resp: (!) 86     Temp: 98 F (36.7 C) 98 F (36.7 C)    TempSrc: Oral Oral    SpO2: 93% 94%    Weight:   (!) 138.7 kg   Height:        Intake/Output Summary (Last 24 hours) at 06/09/2018 1155 Last data filed at 06/09/2018 0500 Gross per 24 hour  Intake 60 ml  Output 1600 ml  Net -1540 ml   Filed Weights   06/07/18 0500 06/08/18 0332 06/09/18 0651  Weight: (!) 147 kg (!) 140.7 kg (!) 138.7 kg    Physical Examination:  General exam: Appears calm and comfortable  Respiratory system: Clear to auscultation. Respiratory effort normal. Cardiovascular system: S1 & S2 heard, RRR. No JVD, murmurs,  rubs, gallops or clicks. No pedal edema. Gastrointestinal system: Abdomen is nondistended, soft and nontender. No organomegaly or masses felt. Normal bowel sounds heard. Central nervous system: Alert and oriented, noted to have slurred speech.  Able to move all extremities with strength 4/5 Skin: No rashes, lesions or ulcers Psychiatry: Judgement and insight appear normal. Mood & affect appropriate.     Data Reviewed: I have personally reviewed following labs and imaging studies  CBC: Recent Labs  Lab 06/03/18 0653 06/04/18 0824 06/05/18 0129 06/06/18 1022 06/08/18 0322  WBC 6.8 8.1 9.1 9.0 8.1  HGB 7.8* 7.9* 8.4* 9.0* 7.9*  HCT 30.3* 32.7* 34.5* 36.2 31.0*  MCV 81.5 82.6 84.1 81.3 83.6  PLT 447* 430* 402* 381 834   Basic Metabolic Panel: Recent Labs  Lab 06/04/18 0824 06/05/18 0129 06/05/18 0602 06/06/18 1022 06/08/18 0322  NA 140 136 140 141 139  K 4.9 6.2* 4.1 3.8 3.7  CL 93* 91* 93* 93* 92*  CO2 42* 34* 41* 42* 41*  GLUCOSE 88 134* 86 79 195*  BUN 21* 21* 19 16 22*  CREATININE 1.02* 1.10* 1.04* 0.94 1.00  CALCIUM 8.4* 8.3* 8.6* 8.6* 8.2*   GFR: Estimated Creatinine Clearance: 90.2 mL/min (by C-G formula based on SCr of 1 mg/dL). Liver Function Tests: Recent Labs  Lab 06/06/18 1022 06/08/18 0322  AST 25 21  ALT 14 13  ALKPHOS  67 80  BILITOT 1.5* 1.1  PROT 8.8* 8.5*  ALBUMIN 2.5* 2.5*   No results for input(s): LIPASE, AMYLASE in the last 168 hours. Recent Labs  Lab 06/05/18 0040 06/06/18 1022  AMMONIA 138* 55*   Coagulation Profile: No results for input(s): INR, PROTIME in the last 168 hours. Cardiac Enzymes: No results for input(s): CKTOTAL, CKMB, CKMBINDEX, TROPONINI in the last 168 hours. BNP (last 3 results) No results for input(s): PROBNP in the last 8760 hours. HbA1C: No results for input(s): HGBA1C in the last 72 hours. CBG: Recent Labs  Lab 06/08/18 1123 06/08/18 1619 06/08/18 2133 06/09/18 0720 06/09/18 1138  GLUCAP 225* 184*  133* 129* 199*   Lipid Profile: No results for input(s): CHOL, HDL, LDLCALC, TRIG, CHOLHDL, LDLDIRECT in the last 72 hours. Thyroid Function Tests: No results for input(s): TSH, T4TOTAL, FREET4, T3FREE, THYROIDAB in the last 72 hours. Anemia Panel: No results for input(s): VITAMINB12, FOLATE, FERRITIN, TIBC, IRON, RETICCTPCT in the last 72 hours. Sepsis Labs: Recent Labs  Lab 06/05/18 0137  LATICACIDVEN 1.4    Recent Results (from the past 240 hour(s))  MRSA PCR Screening     Status: None   Collection Time: 06/05/18  5:28 AM  Result Value Ref Range Status   MRSA by PCR NEGATIVE NEGATIVE Final    Comment:        The GeneXpert MRSA Assay (FDA approved for NASAL specimens only), is one component of a comprehensive MRSA colonization surveillance program. It is not intended to diagnose MRSA infection nor to guide or monitor treatment for MRSA infections. Performed at [facility], [place] [address], [place], [place] [address]       Radiology Studies: No results found.      Scheduled Meds: . aspirin  81 mg Per NG tube Daily  . carvedilol  3.125 mg Oral BID WC  . chlorhexidine gluconate (MEDLINE KIT)  15 mL Mouth Rinse BID  . Gerhardt's butt cream   Topical TID  . heparin injection (subcutaneous)  5,000 Units Subcutaneous Q8H  . insulin aspart  0-9 Units Subcutaneous TID WC  . insulin detemir  15 Units Subcutaneous Daily  . multivitamin  15 mL Oral Daily  . pantoprazole  40 mg Oral Q1200  . rosuvastatin  40 mg Oral q1800  . torsemide  60 mg Oral Daily   Continuous Infusions: . sodium chloride Stopped (06/06/18 1807)  . [START ON 06/10/2018] ferumoxytol      Assessment & Plan:    1.  Acute on chronic hypoxic and hypercapnic respiratory failure: Present on admission.  Patient has a history of COPD, chronic combined systolic and diastolic CHF and sleep apnea.  She uses 4 L continuous nasal cannula at baseline and was supposed to be on CPAP at night but been  noncompliant for at least an year.  She required intubation and ICU stay in this hospitalization.  She is now extubated and saturating well on 4 L nasal cannula.  Requested significant other to bring her home CPAP machine yesterday and reminded again today.  May need new equipment.  Continue neb treatments.  Not on antibiotics or steroids.  Continue oral Demadex.  2. Right MCA/PCA watershed/right parietal CVA:?  Embolic vs cocaine induced. Urine drug screen positive for cocaine on admission . Per neurology note from 1/2 : " Given patient not a good long-term AC candidate d/t chronic anemia, will not pursue further embolic inpatient workup (TEE, loop, hypercoagulable labs) can consider possible 30-day monitor as an  OP to look for AF.". Cardiology and neurology recommended discontinuation of Effient since post stroke, patient refractory to Plavix. On aspirin and statins. Echo EF 45%  3.  Tonic-clonic seizures: Secondary to problem #1 versus problem #2 vs cocaine induced.  EEG normal.  Received Keppra load but now of antiepileptics.  No further seizure episodes.  4.Acute on chronic iron deficiency anemia with reactive thrombocytosis: Received 3 units PRBC in this hospitalization.  Hemoglobin now improved to 9.  On PPI.    5.  Elevated ammonia level: Present on admission and patient received lactulose enemas.  Subsequently encephalopathy felt secondary to hypercarbic respiratory failure and off lactulose.  Ammonia level improved.  Normal transaminases on labs.  6.  Combined chronic systolic/diastolic CHF: Off Lasix and transitioned to torsemide 60 mg daily by critical care for better absorption/once daily dosing.  Continue Coreg.  Per cardiology patient needs lipid profile and LFTs checked every 2 months.  7.  Type 2 diabetes mellitus: Continue Levemir and sliding scale insulin  8. Sleep apnea: Needs home CPAP resumed. Significant other to bring in the home equipment.  Last sleep study on record in 2016.     9. Cocaine abuse: counseled regarding risks. She doesn't want this discussed with her daughter.  DVT prophylaxis: Heparin Code Status: Full code Family / Patient Communication: Discussed with patient and significant other bedside. Disposition Plan: Inpatient rehab.     LOS: 10 days    Time spent: 25 minutes  Can downgrade to telemetry  Guilford Shi, MD [facility] Pager [phone]  If 7PM-7AM, please contact night-coverage [email or web address] Password [email or web address] 06/09/2018, 11:55 AM

## 2018-06-09 NOTE — Progress Notes (Deleted)
PROGRESS NOTE    Tamara Silva  KXF:[id number]  DOB: 12/17/1960  DOA: 05/29/2018 PCP: Antony Blackbird, MD  Brief Narrative:  58 year old female, history of OSA, COPD, chronic hypoxic respiratory failure(4 L at home), iron deficiency anemia who presented on 12/30 for generalized weakness , slurred speech, and a found to have acute anemia with hgb 5.7, a right MCA/PCA watershed area stroke and a 2 cm infarct in the inferior right parietal lobe (felt to be incidental), with hypercapnic respiratory failure. Placed on BiPAP but had been tolerating it well. Patient became unresponsive, had a generalized tonic-clonic seizure, apneic, and pulselessness. CODE BLUE was called. Patient was intubated and transferred to ICU. Now extubated and on nightly BIPAP.   Subjective:  Patient resting comfortably and now down to 4L nasal cannula 9 which is her baseline). Was supposed to be using CPAP at night but states the machine has been broke for at least a year now.  She has slurred speech and still requiring lot of assistance to walk and balance  Patient's significant other is bedside and feels her mental status is back to baseline.  She is able to move all her extremities    Objective: Vitals:   06/09/18 0032 06/09/18 0435 06/09/18 0651 06/09/18 0741  BP: (!) 151/70 126/66  130/70  Pulse: 76 73    Resp: (!) 86     Temp: 98 F (36.7 C) 98 F (36.7 C)    TempSrc: Oral Oral    SpO2: 93% 94%    Weight:   (!) 138.7 kg   Height:        Intake/Output Summary (Last 24 hours) at 06/09/2018 1101 Last data filed at 06/09/2018 0500 Gross per 24 hour  Intake 60 ml  Output 1600 ml  Net -1540 ml   Filed Weights   06/07/18 0500 06/08/18 0332 06/09/18 0651  Weight: (!) 147 kg (!) 140.7 kg (!) 138.7 kg    Physical Examination:  General exam: Appears calm and comfortable  Respiratory system: Clear to auscultation. Respiratory effort normal. Cardiovascular system: S1 & S2 heard, RRR. No JVD, murmurs,  rubs, gallops or clicks. No pedal edema. Gastrointestinal system: Abdomen is nondistended, soft and nontender. No organomegaly or masses felt. Normal bowel sounds heard. Central nervous system: Alert and oriented, noted to have slurred speech.  Able to move all extremities with strength 4/5 Skin: No rashes, lesions or ulcers Psychiatry: Judgement and insight appear normal. Mood & affect appropriate.     Data Reviewed: I have personally reviewed following labs and imaging studies  CBC: Recent Labs  Lab 06/03/18 0653 06/04/18 0824 06/05/18 0129 06/06/18 1022 06/08/18 0322  WBC 6.8 8.1 9.1 9.0 8.1  HGB 7.8* 7.9* 8.4* 9.0* 7.9*  HCT 30.3* 32.7* 34.5* 36.2 31.0*  MCV 81.5 82.6 84.1 81.3 83.6  PLT 447* 430* 402* 381 696   Basic Metabolic Panel: Recent Labs  Lab 06/04/18 0824 06/05/18 0129 06/05/18 0602 06/06/18 1022 06/08/18 0322  NA 140 136 140 141 139  K 4.9 6.2* 4.1 3.8 3.7  CL 93* 91* 93* 93* 92*  CO2 42* 34* 41* 42* 41*  GLUCOSE 88 134* 86 79 195*  BUN 21* 21* 19 16 22*  CREATININE 1.02* 1.10* 1.04* 0.94 1.00  CALCIUM 8.4* 8.3* 8.6* 8.6* 8.2*   GFR: Estimated Creatinine Clearance: 90.2 mL/min (by C-G formula based on SCr of 1 mg/dL). Liver Function Tests: Recent Labs  Lab 06/06/18 1022 06/08/18 0322  AST 25 21  ALT 14 13  ALKPHOS  67 80  BILITOT 1.5* 1.1  PROT 8.8* 8.5*  ALBUMIN 2.5* 2.5*   No results for input(s): LIPASE, AMYLASE in the last 168 hours. Recent Labs  Lab 06/05/18 0040 06/06/18 1022  AMMONIA 138* 55*   Coagulation Profile: No results for input(s): INR, PROTIME in the last 168 hours. Cardiac Enzymes: No results for input(s): CKTOTAL, CKMB, CKMBINDEX, TROPONINI in the last 168 hours. BNP (last 3 results) No results for input(s): PROBNP in the last 8760 hours. HbA1C: No results for input(s): HGBA1C in the last 72 hours. CBG: Recent Labs  Lab 06/08/18 0724 06/08/18 1123 06/08/18 1619 06/08/18 2133 06/09/18 0720  GLUCAP 155* 225*  184* 133* 129*   Lipid Profile: No results for input(s): CHOL, HDL, LDLCALC, TRIG, CHOLHDL, LDLDIRECT in the last 72 hours. Thyroid Function Tests: No results for input(s): TSH, T4TOTAL, FREET4, T3FREE, THYROIDAB in the last 72 hours. Anemia Panel: No results for input(s): VITAMINB12, FOLATE, FERRITIN, TIBC, IRON, RETICCTPCT in the last 72 hours. Sepsis Labs: Recent Labs  Lab 06/05/18 0137  LATICACIDVEN 1.4    Recent Results (from the past 240 hour(s))  MRSA PCR Screening     Status: None   Collection Time: 06/05/18  5:28 AM  Result Value Ref Range Status   MRSA by PCR NEGATIVE NEGATIVE Final    Comment:        The GeneXpert MRSA Assay (FDA approved for NASAL specimens only), is one component of a comprehensive MRSA colonization surveillance program. It is not intended to diagnose MRSA infection nor to guide or monitor treatment for MRSA infections. Performed at [facility], [place] [address], [place], [place] [address]       Radiology Studies: No results found.      Scheduled Meds: . aspirin  81 mg Per NG tube Daily  . carvedilol  3.125 mg Oral BID WC  . chlorhexidine gluconate (MEDLINE KIT)  15 mL Mouth Rinse BID  . Gerhardt's butt cream   Topical TID  . heparin injection (subcutaneous)  5,000 Units Subcutaneous Q8H  . insulin aspart  0-9 Units Subcutaneous TID WC  . insulin detemir  15 Units Subcutaneous Daily  . multivitamin  15 mL Oral Daily  . pantoprazole  40 mg Oral Q1200  . rosuvastatin  40 mg Oral q1800  . torsemide  60 mg Oral Daily   Continuous Infusions: . sodium chloride Stopped (06/06/18 1807)  . [START ON 06/10/2018] ferumoxytol      Assessment & Plan:    1.  Acute on chronic hypoxic and hypercapnic respiratory failure: Present on admission.  Patient has a history of COPD, chronic combined systolic and diastolic CHF and sleep apnea.  She uses 4 L continuous nasal cannula at baseline and was supposed to be on CPAP at night but been  noncompliant for at least an year.  She required intubation and ICU stay in this hospitalization.  She is now extubated and saturating well on 4 L nasal cannula.  Requested significant other to bring her home CPAP machine yesterday and reminded again today.  May need new equipment.  Continue neb treatments.  Not on antibiotics or steroids.  Continue oral Demadex.  2. Right MCA/PCA watershed/right parietal CVA:?  Embolic vs cocaine induced. Urine drug screen positive for cocaine on admission  On aspirin and statins. Echo EF 45%  3.  Tonic-clonic seizures: Secondary to problem #1 versus problem #2 vs cocaine induced.  EEG normal.  Received Keppra load but now of antiepileptics.  No further  seizure episodes.  4.Acute on chronic iron deficiency anemia with reactive thrombocytosis: Received 3 units PRBC in this hospitalization.  Hemoglobin now improved to 9.  On PPI.    5.  Elevated ammonia level: Present on admission and patient received lactulose enemas.  Subsequently encephalopathy felt secondary to hypercarbic respiratory failure and off lactulose.  Ammonia level improved.  Normal transaminases on labs.  6.  Combined chronic systolic/diastolic CHF: Off Lasix and transitioned to torsemide 60 mg daily by critical care for better absorption/once daily dosing.  Continue Coreg.  Per cardiology patient needs lipid profile and LFTs checked every 2 months.  7.  Type 2 diabetes mellitus: Continue Levemir and sliding scale insulin  8. Sleep apnea: Needs home CPAP resumed. Significant other to bring in the home equipment.  Last sleep study on record in 2016.   9. Cocaine abuse: counseled regarding risks. She doesn't want this discussed with her daughter.  DVT prophylaxis: Heparin Code Status: Full code Family / Patient Communication: Discussed with patient and significant other bedside. Disposition Plan: Inpatient rehab.     LOS: 10 days    Time spent: 25 minutes  Can downgrade to  telemetry  Guilford Shi, MD [facility] Pager [phone]  If 7PM-7AM, please contact night-coverage [email or web address] Password [email or web address] 06/09/2018, 11:01 AM

## 2018-06-09 NOTE — Progress Notes (Signed)
Tamara Gong, RN  Rehab Admission Coordinator  Physical Medicine and Rehabilitation  PMR Pre-admission  Signed  Date of Service:  06/09/2018 12:17 PM       Related encounter: ED to Hosp-Admission (Current) from 05/29/2018 in [facility]      Signed         Show:Clear all _0 Manual_1 Template_2 Copied  Added by: _3 Tamara Gong, RN  _4 Hover for details PMR Admission Coordinator Pre-Admission Assessment  Patient: Tamara Silva is an 58 y.o., female MRN: [id number] DOB: [birth year] Height: 5' 7.5" (171.5 cm) Weight: (!) 138.7 kg                                                                                                                                                  Insurance Information HMO:     PPO:      PCP:      IPA:      80/20:      OTHER:  PRIMARY: Medicaid [place] Access      Policy#: [id number] p      Benefits:  Phone #: passport one online     Name: 06/08/2018 Eff. Date: active 06/08/2018      MADCY active  Medicaid Application Date:       Case Manager:  Disability Application Date:       Case Worker:   Emergency Contact Information         Contact Information    Name Relation Home Work Mobile   [place] Daughter [phone]  [phone]   Davis,Sally Sister [phone]       Current Medical History  Patient Admitting Diagnosis: Debility  WPY:KDXIP McDanielis a 58 y.o.femalewith history of T2DM, asthma, COPD with chronic hypoxic respiratory failure-Oxygen dependent, OSA--noncompliant with CPAP, anemia, morbid obesity- BMI 50 who was admitted to [facility] on 05/29/18 with generalized weakness, lethargy, acute on chronic anemia with Hgb 5.7 and slurred speech.Family reported confusion for couple of days PTA. Ammonia level elevated at 94 and UDS positive for cocaine. She was treated with diuretics for acute on chronic biventricular HF, placed on BIPAP and transfused with 2 units PRBC. Cardiology  consulted for input and recommended holding ASA and parasugel till source of bleeding found. CT head done 1/1 reviewed, showing right posterior brain infarct. Due to lethargy and revealed acute to subacute right occipitoparietal infarct with small areas of cytotoxic edema and she was transferred to [facility] for management. Neurology felt that stroke most likely cardioembolic but patient not good long term AC candidate due to chronic anemia and to add ASA back once H/H stable.   She was weaned to CPAP but has been noncompliant. She developed mental status changes on 1/5 and placed on BIPAP but was non-compliant with use. Later that day she developed progressive hypoxia with unresponsiveness and generalized seizure type activity requiring intubation. She was loaded  with Keppra and follow up CT head without extension or hemorrhage. She tolerated extubation to BIPAP 1/6 and started on dysphagia 3, thins. Acute on chronic CHF improving--lasix transitioned to [facility] and respiratory failure felt to multifactorial due to OHS, sleep apnea and COPD. Therapy evaluations completed revealing delayed processing with impulsivity, balance deficits and difficult with mobility    .Complete NIHSS TOTAL: 0  Past Medical History      Past Medical History:  Diagnosis Date  . Asthma   . CAD (coronary artery disease)   . Chronic combined systolic (congestive) and diastolic (congestive) heart failure ([place])   . Chronic iron deficiency anemia   . Diabetes mellitus without complication ([place])   . DOE (dyspnea on exertion)   . Edema of both legs   . Hypertension   . Pulmonary emboli ([place]) 2014    Family History  family history includes Asthma in her paternal aunt; Breast cancer in her paternal aunt; Colon cancer in her father; Diabetes in her father; Emphysema in her brother; Heart attack in her father; Hypertension in her mother; Lung disease in her brother; Sarcoidosis in an other family member; Stroke  in her mother.  Prior Rehab/Hospitalizations:  Has the patient had major surgery during 100 days prior to admission? No  Current Medications   Current Facility-Administered Medications:  .  0.9 %  sodium chloride infusion, , Intravenous, PRN, Asencion Noble, MD, Stopped at 06/06/18 1807 .  acetaminophen (TYLENOL) tablet 650 mg, 650 mg, Oral, Q6H PRN, Sherry Ruffing, Marissa M, MD, 650 mg at 06/03/18 1300 .  albuterol (PROVENTIL) (2.5 MG/3ML) 0.083% nebulizer solution 2.5 mg, 2.5 mg, Nebulization, Q2H PRN, Sherry Ruffing, Marissa M, MD, 2.5 mg at 06/03/18 1400 .  aspirin chewable tablet 81 mg, 81 mg, Per NG tube, Daily, Asencion Noble, MD, 81 mg at 06/09/18 0956 .  carvedilol (COREG) tablet 3.125 mg, 3.125 mg, Oral, BID WC, Asencion Noble, MD, 3.125 mg at 06/09/18 0956 .  chlorhexidine gluconate (MEDLINE KIT) (PERIDEX) 0.12 % solution 15 mL, 15 mL, Mouth Rinse, BID, Asencion Noble, MD, 15 mL at 06/08/18 2134 .  [START ON 06/10/2018] ferumoxytol (FERAHEME) 510 mg in sodium chloride 0.9 % 100 mL IVPB, 510 mg, Intravenous, Weekly, Asencion Noble, MD .  Gerhardt's butt cream, , Topical, TID, Asencion Noble, MD .  heparin injection 5,000 Units, 5,000 Units, Subcutaneous, Q8H, Matcha, Anupama, MD, 5,000 Units at 06/09/18 0508 .  hydrALAZINE (APRESOLINE) injection 5 mg, 5 mg, Intravenous, Q6H PRN, Asencion Noble, MD, 5 mg at 05/31/18 0107 .  insulin aspart (novoLOG) injection 0-9 Units, 0-9 Units, Subcutaneous, TID WC, Mauri Brooklyn, MD, 2 Units at 06/09/18 1227 .  insulin detemir (LEVEMIR) injection 15 Units, 15 Units, Subcutaneous, Daily, Asencion Noble, MD, 15 Units at 06/09/18 0955 .  multivitamin liquid 15 mL, 15 mL, Oral, Daily, Asencion Noble, MD, 15 mL at 06/08/18 0818 .  ondansetron (ZOFRAN) tablet 4 mg, 4 mg, Oral, Q6H PRN **OR** ondansetron (ZOFRAN) injection 4 mg, 4 mg, Intravenous, Q6H PRN, Asencion Noble, MD, 4 mg at 05/29/18 1850 .  pantoprazole (PROTONIX) EC  tablet 40 mg, 40 mg, Oral, Q1200, Asencion Noble, MD, 40 mg at 06/09/18 1226 .  rosuvastatin (CRESTOR) tablet 40 mg, 40 mg, Oral, q1800, Lelon Perla, MD, 40 mg at 06/08/18 1745 .  torsemide (DEMADEX) tablet 60 mg, 60 mg, Oral, Daily, Asencion Noble, MD, 60 mg at 06/09/18 9458  Patients Current Diet:  Diet Order                  DIET DYS 3 Room service appropriate? Yes; Fluid consistency: Thin  Diet effective now               Precautions / Restrictions Precautions Precautions: Fall Precaution Comments: uses 2L O2 at baseline Restrictions Weight Bearing Restrictions: No   Has the patient had 2 or more falls or a fall with injury in the past year?No  Prior Activity Level Limited Community (1-2x/wk): sedentary at baseline; [name] assists with adls supervision with RW at baseline for short distances. [name] assists with sponge baths at bedside. Does not use tub/shower due to mold issues. Uses ) 2 at 4 liters [place] pta. Has not used home CPAP for 1 year due to malfunctioning.  Home Assistive Devices / Equipment Home Assistive Devices/Equipment: None Home Equipment: Cane - single point, Bedside commode  Prior Device Use: Indicate devices/aids used by the patient prior to current illness, exacerbation or injury? Walker  Prior Functional Level Prior Function Level of Independence: Needs assistance Gait / Transfers Assistance Needed: ambulates short distances with RW per [name] ADL's / Homemaking Assistance Needed: [name] helps sponge bathe as she stands at side of bed  Self Care: Did the patient need help bathing, dressing, using the toilet or eating?  Needed some help  Indoor Mobility: Did the patient need assistance with walking from room to room (with or without device)? Needed some help  Stairs: Did the patient need assistance with internal or external stairs (with or without device)? Needed some help  Functional Cognition: Did the patient  need help planning regular tasks such as shopping or remembering to take medications? Needed some help  Current Functional Level Cognition  Arousal/Alertness: Awake/alert Overall Cognitive Status: Impaired/Different from baseline Current Attention Level: Sustained, Focused Orientation Level: Oriented X4 Following Commands: Follows one step commands with increased time Safety/Judgement: Decreased awareness of safety, Decreased awareness of deficits General Comments: Sister present and reports pt speech seems slower than usual (sister reports she has not seen pt in a while, so not 100% what baseline). Pt with poor attention, difficulty dividing attention. Slow to initiate movement. Requires frequent cues for redirection. Cues for simple problem solving Attention: Focused Focused Attention: Impaired Focused Attention Impairment: Verbal basic, Functional basic Memory: Impaired Memory Impairment: Decreased recall of new information Awareness: Appears intact Problem Solving: Impaired Problem Solving Impairment: Verbal basic Executive Function: Organizing, Self Monitoring, Self Correcting Organizing: Impaired Organizing Impairment: Verbal basic Self Monitoring: Impaired Self Monitoring Impairment: Verbal basic Self Correcting: Impaired Self Correcting Impairment: Verbal basic Behaviors: Perseveration, Confabulation Safety/Judgment: Impaired    Extremity Assessment (includes Sensation/Coordination)  Upper Extremity Assessment: Generalized weakness RUE Deficits / Details: decreased fine motor/coordination noted during finger opposition; grossly 4/5 strength throughout RUE Coordination: decreased fine motor  Lower Extremity Assessment: Overall WFL for tasks assessed RLE Deficits / Details: general weakness gross flexion weaker than gross ext. RLE Coordination: decreased fine motor LLE Deficits / Details: general weakness LLE Coordination: decreased fine motor    ADLs  Overall  ADL's : Needs assistance/impaired Eating/Feeding: Minimal assistance, Sitting Eating/Feeding Details (indicate cue type and reason): pt request applesauce, but when it is handed to her, she passively stares at it, and at times, refuses to self feed.  Cues provided to perform on her own.  Once she gets started, she is able to continue task with supervision  Grooming: Wash/dry face, Sitting, Set up Grooming Details (indicate cue type and  reason): assist to initiate and for thoroughness  Upper Body Bathing: Maximal assistance, Sitting Lower Body Bathing: Maximal assistance, Sit to/from stand Upper Body Dressing : Moderate assistance, Sitting Upper Body Dressing Details (indicate cue type and reason): requires increased assist due to poor sitting balance Lower Body Dressing: Total assistance, Sit to/from stand Toilet Transfer: +2 for physical assistance, Minimal assistance, Stand-pivot, BSC Toileting- Clothing Manipulation and Hygiene: Total assistance, Sit to/from stand Functional mobility during ADLs: Moderate assistance, Cueing for safety, Cueing for sequencing, Rolling walker General ADL Comments: increased time for any task and requires slow speech    Mobility  Overal bed mobility: Needs Assistance Bed Mobility: Supine to Sit Supine to sit: Min guard, HOB elevated Sit to supine: Mod assist, HOB elevated General bed mobility comments: Increased time and effort; cues to attend to task throughout as pt easily distracted     Transfers  Overall transfer level: Needs assistance Equipment used: Rolling walker (2 wheeled) Transfers: Sit to/from Stand Sit to Stand: Mod assist, From elevated surface Stand pivot transfers: +2 physical assistance, Min assist General transfer comment: Initially stood from bed with bilateral HHA and modA+2; stood from [facility] with min guard, heavy reliance on BUE support to push into standing    Ambulation / Gait / Stairs / Wheelchair Mobility   Ambulation/Gait Ambulation/Gait assistance: Min assist, +2 safety/equipment, Mod assist Gait Distance (Feet): 12 Feet Assistive device: Rolling walker (2 wheeled) Gait Pattern/deviations: Leaning posteriorly, Step-to pattern General Gait Details: Slow, unsteady amb with RW and minA+2 for balance and chair follow; pt with very poor attention, intermittent self-corrected knee buckling, and abrupt attempt to sit requiring assist+3 for safety and modA for descent to recliner. SpO2 down to 78% on 2L O2 [place], returning to >88% on 4L O2 with rest and deep breathing Gait velocity: Decreased Gait velocity interpretation: <1.31 ft/sec, indicative of household ambulator    Posture / Balance Dynamic Sitting Balance Sitting balance - Comments: able to maintain static sitting with min guard assist.  Pt somewhat impulsive so close min guard assist required  Balance Overall balance assessment: Needs assistance Sitting-balance support: Feet supported Sitting balance-Leahy Scale: Fair Sitting balance - Comments: able to maintain static sitting with min guard assist.  Pt somewhat impulsive so close min guard assist required  Postural control: Posterior lean Standing balance support: Bilateral upper extremity supported Standing balance-Leahy Scale: Poor Standing balance comment: requires min assist and B UE support in standing    Special needs/care consideration BiPAP/CPAP home CPAP not functioning per pt for one year. I have asked pt to have her daughter bring in the actual machine for medical team to evaluate; current I hospital settings are BIPAP dream station settings 18/5 with 4 liters bled into system managed by RRT nightly CPM n/a Continuous Drip IV n/a Dialysis n/a Life Vest n/a Oxygen )2 at 4 liters [place] during the day Special Bed n/a Trach Size n/a Wound Vac n/a Skin       McNichol, Rudi Heap, RN  Registered Nurse  WOC  Consult Note    Signed    Date of Service:  06/05/2018 9:27 AM             Signed         Show:Clear all _0 ?Manual_1 ?Template_2 ?Copied  Added by: _3 ?McNichol, Rudi Heap, RN  _4 ?Hover for details [place] Nurse wound consult note Reason for Consult:Patient with moisture associated skin damage (MASD), specifically intertriginous dermatitis in the bilateral intermammary, bilateral inguinal and subpannicular areas. There is evidence of incontinence  associated dermatitis (IAD) with peeling in the medial thighs consistent with fungal overgrowth. She currently has a female external suction device for UI management. Her heels and sacrum are intact but are at risk for skin breakdown; prevent measures will be placed for those areas. Wound type:Moisture (ITD and IAD) Pressure Injury POA: N/A Measurement: 0.4cm x 12cm area of ITD in the area between breasts with partial thickness tissue loss. Open, dry lesions. Moisture collection in the subpannicular area and bilateral inguinal areas, but no breakdown. Peeling epidermis in the bilateral medial thigh areas consistent with fungal overgrowth in urinary incontinence. Wound bed:As described above Drainage (amount, consistency, odor)None Peri wound:intact, dry Dressing procedure/placement/frequency:I will today provide bilateral pressure redistribution heel boots (Prevalon) and a sacral prophylactic dressing as she is at risk for pressure injury. She is currently on a mattress replacement with low air loss feature and is being turned and repositioned side to side. I have provided guidance for Nursing via the Orders for use of our antimicrobial wicking textile (InterDry Ag+) in the inframammary, bilateral inguinal and subpannicular areas. We will implement use of Gerhart's Butt cream (a compounded 1:1:1 product with zinc oxide, lotrimin and hydrocortisone cream). A systemic antifungal would expedite resolution of the fungal overgrowth in the perineal and medial thigh areas, if you agree, please order (e.g.  Diflucan).  [facility] nursing team will not follow, but will remain available to this patient, the nursing and medical teams. Please re-consult if needed. Thanks, Maudie Flakes, MSN, RN, [place], Arther Abbott  Pager# [phone]               Bowel mgmt: continent LBM 1/7 Bladder mgmt: incontinent ; external catheter Diabetic mgmt yes   Previous Home Environment Living Arrangements: Spouse/significant other  Lives With: Significant other([name]) Available Help at Discharge: Family, Available 24 hours/day Type of Home: House Home Layout: One level Home Access: Stairs to enter [employer] of Steps: 2 Bathroom Shower/Tub: (pateitn does not use) Bathroom Toilet: Handicapped height Bathroom Accessibility: Yes How Accessible: Accessible via walker [place]: No Type of Home Care Services: [place] (if known): patietn and [name] deny [place] is in place Additional Comments: PLOF reviewed with [name]   Discharge Living Setting Plans for Discharge Living Setting: Patient's home, Lives with (comment)([name], significant other) Type of Home at Discharge: House Discharge Home Layout: One level Discharge Home Access: Stairs to enter Entrance Stairs-Rails: None Entrance Stairs-Number of Steps: 2 Discharge Bathroom Shower/Tub: Tub/shower unit(pt does not use tub) Discharge Bathroom Toilet: Handicapped height Discharge Bathroom Accessibility: Yes How Accessible: Accessible via walker Does the patient have any problems obtaining your medications?: No  Social/Family/Support Systems Patient Roles: Parent, Partner Contact Information: [name] is her significant other but her daughter, Lenward Chancellor is her NOK Anticipated Caregiver: [name] Anticipated Ambulance person Information: see above Ability/Limitations of Caregiver: reggie has no physical limitations; daughter works Careers adviser: 24/7 Discharge Plan Discussed with Primary  Caregiver: Yes Is Caregiver In Agreement with Plan?: Yes Does Caregiver/Family have Issues with Lodging/Transportation while Pt is in Rehab?: No([name] stays with pt 24/7 in hospital; he does not drive)   Patient and [name] have been together for 12 years  Goals/Additional Needs Patient/Family Goal for Rehab: Supervision with PT, OT, and SLP Expected length of stay: [facility]OS 16 to 19 days Equipment Needs: Pt states her CPAP at home not working for one year; I have asked her to have her daughter to bring CPAP from home in Pt/Family Agrees to Admission and  willing to participate: Yes Program Orientation Provided & Reviewed with Pt/Caregiver Including Roles  & Responsibilities: Yes  Barriers to Discharge: (lack of home CPAP)  Decrease burden of Care through IP rehab admission: n/a  Possible need for SNF placement upon discharge: not anticipated  Patient Condition: This patient's medical and functional status has changed since the consult dated 06/07/2018 in which the Rehabilitation Physician determined and documented that the patient was potentially appropriate for intensive rehabilitative care in an inpatient rehabilitation facility. Issues have been addressed and update has been discussed with Dr. Naaman Plummer and patient now appropriate for inpatient rehabilitation. Will admit to inpatient rehab today.   Preadmission Screen Completed By:  Cleatrice Burke, 06/09/2018 12:30 PM ______________________________________________________________________   Discussed status with Dr. Naaman Plummer on 06/09/2018 at  1228  and received telephone approval for admission today.  Admission Coordinator:  Cleatrice Burke, time 1228 Date 06/09/2018           Cosigned by: Meredith Staggers, MD at 06/09/2018 12:34 PM  Revision History

## 2018-06-09 NOTE — Discharge Summary (Addendum)
Physician Discharge Summary  Tamara Silva QBH:[id number] DOB: [birth year] DOA: 05/29/2018  PCP: Antony Blackbird, MD  Admit date: 05/29/2018 Discharge date: 06/09/2018 Consultations: Neurology Admitted From: home Disposition:  Acute inpatient rehab  Discharge Diagnoses:  Principal Problem:   Acute on chronic anemia Active Problems:   Essential hypertension   Chronic combined systolic and diastolic heart failure ([place])   Uncontrolled type 2 diabetes mellitus with hyperglycemia, with long-term current use of insulin (HCC)   Acute and chr resp failure, unsp w hypoxia or hypercapnia (HCC)   Congestive heart failure (CHF) (HCC)   COPD (chronic obstructive pulmonary disease) (HCC)   AKI (acute kidney injury) ([place])   Pressure injury of skin   Cerebral embolism with cerebral infarction   Generalized tonic-clonic seizure ([place])   Cocaine abuse ([place])   Diabetes mellitus type 2 in obese [facility])   Brief/Interim Summary: 58 year old female, history of OSA, COPD, chronic hypoxic respiratory failure(4 L at home), iron deficiency anemia who presented on 12/30 for generalized weakness , slurred speech, and a found to have acute anemia with hgb 5.7, a right MCA/PCA watershed area stroke and a 2 cm infarct in the inferior right parietal lobe (felt to be incidental), with hypercapnic respiratory failure. Placed on BiPAP but had been tolerating it well. Patient became unresponsive, had a generalized tonic-clonic seizure, apneic, and pulselessness. CODE BLUE was called. Patient was intubated and transferred to ICU.Now extubated and on nightly BIPAP.   1.  Acute on chronic hypoxic and hypercapnic respiratory failure: Present on admission.  Patient has a history of COPD, chronic combined systolic and diastolic CHF and sleep apnea.  She uses 4 L continuous nasal cannula at baseline and was supposed to be on CPAP at night but been noncompliant for at least an year.  She required intubation and ICU stay in this  hospitalization.  She is now extubated and saturating well on 4 L nasal cannula.  Requested significant other to bring her home CPAP machine yesterday and reminded again today.  May need new equipment.  Continue neb treatments.  Not on antibiotics or steroids.  Continue oral Demadex.  2. Right MCA/PCA watershed/right parietal CVA:?  Embolic vs cocaine induced. Urine drug screen positive for cocaine on admission . Per neurology note from 1/2 : " Given patientnot a good long-term AC candidated/tchronic anemia, will not pursue further embolic inpatient workup (TEE, loop, hypercoagulable labs) can consider possible 30-day monitor as an OP to look for AF.". Cardiology and neurology recommended discontinuation of Effient since post stroke, patient refractory to Plavix. On aspirin and statins. Echo EF 45%  3.  Tonic-clonic seizures: Secondary to problem #1 versus problem #2 vs cocaine induced.  EEG normal.  Received Keppra load but now of antiepileptics.  No further seizure episodes.  4.Acute on chronic iron deficiency anemia with reactive thrombocytosis: Received 3 units PRBC in this hospitalization.  Hemoglobin now improved to 9.  On PPI.    5.  Elevated ammonia level: Present on admission and patient received lactulose enemas.  Subsequently encephalopathy felt secondary to hypercarbic respiratory failure and off lactulose.  Ammonia level improved.  Normal transaminases on labs.  6.  Combined chronic systolic/diastolic CHF: Off Lasix and transitioned to torsemide 60 mg daily by critical care for better absorption/once daily dosing.  Continue Coreg.  Per cardiology patient needs lipid profile and LFTs checked every 2 months.  7.  Type 2 diabetes mellitus: Continue Levemir and sliding scale insulin  8. Sleep apnea: Needs home CPAP resumed. Significant other to  bring in the home equipment.  Last sleep study on record in 2016.   9. Cocaine abuse: counseled regarding risks. She doesn't want this  discussed with her daughter.  DVT prophylaxis: Heparin Code Status: Full code Family / Patient Communication: Discussed with patient and significant other bedside. Disposition Plan: Inpatient rehab today as bed available  Discharge Exam: Vitals:   06/09/18 0435 06/09/18 0741  BP: 126/66 130/70  Pulse: 73   Resp:    Temp: 98 F (36.7 C)   SpO2: 94%    Vitals:   06/09/18 0032 06/09/18 0435 06/09/18 0651 06/09/18 0741  BP: (!) 151/70 126/66  130/70  Pulse: 76 73    Resp: (!) 86     Temp: 98 F (36.7 C) 98 F (36.7 C)    TempSrc: Oral Oral    SpO2: 93% 94%    Weight:   (!) 138.7 kg   Height:        General: Pt is alert, awake, not in acute distress Cardiovascular: RRR, S1/S2 +, no rubs, no gallops Respiratory: CTA bilaterally, no wheezing, no rhonchi Abdominal: Soft, NT, ND, bowel sounds + Extremities: no edema, no cyanosis  Discharge Instructions  Discharge Instructions    Ambulatory referral to Neurology   Complete by:  As directed    Follow up with Dr. Leonie Man at [facility] in 4-6 weeks. Too complicated for RN to follow. Thanks.   Diet - low sodium heart healthy   Complete by:  As directed    Increase activity slowly   Complete by:  As directed      Allergies as of 06/09/2018      Reactions   Metolazone Other (See Comments)   Dizziness and falling   Plavix [clopidogrel Bisulfate] Other (See Comments)   High PRU's - non-responder   Ibuprofen    Patient stated she can not take ibuprofen.    Nsaids Other (See Comments)   "I do not take NSAIDs d/t interfering with other meds"      Medication List    STOP taking these medications   ACCU-CHEK AVIVA device   ACCU-CHEK AVIVA PLUS test strip Generic drug:  glucose blood   ACCU-CHEK FASTCLIX LANCETS Misc   ACCU-CHEK SOFTCLIX LANCETS lancets   furosemide 40 MG tablet Commonly known as:  LASIX   Insulin Pen Needle 32G X 4 MM Misc Commonly known as:  TRUEPLUS PEN NEEDLES   INSULIN SYRINGE .5CC/29G 29G X 1/2"  0.5 ML Misc   losartan 50 MG tablet Commonly known as:  COZAAR   NOVOLOG FLEXPEN 100 UNIT/ML FlexPen Generic drug:  insulin aspart Replaced by:  insulin aspart 100 UNIT/ML injection   prasugrel 10 MG Tabs tablet Commonly known as:  EFFIENT   prazosin 5 MG capsule Commonly known as:  MINIPRESS   PROAIR HFA 108 (90 Base) MCG/ACT inhaler Generic drug:  albuterol   senna 8.6 MG Tabs tablet Commonly known as:  SENOKOT   spironolactone 25 MG tablet Commonly known as:  ALDACTONE     TAKE these medications   acetaminophen 325 MG tablet Commonly known as:  TYLENOL Take 2 tablets (650 mg total) by mouth every 6 (six) hours as needed for mild pain or fever.   aspirin 81 MG tablet Take 1 tablet (81 mg total) by mouth daily.   carvedilol 12.5 MG tablet Commonly known as:  COREG TAKE 1 TABLET BY MOUTH 2 TIMES A DAY WITH A MEAL **PT NEEDS TO SCHEDULE AN APPT WITH OUR OFFICE AT [phone] FOR  FUTURE REFILLS   chlorhexidine gluconate (MEDLINE KIT) 0.12 % solution Commonly known as:  PERIDEX 15 mLs by Mouth Rinse route 2 (two) times daily.   fluticasone 50 MCG/ACT nasal spray Commonly known as:  FLONASE INSTILL 2 SPRAYS IN EACH NOSTRIL TWICE DAILY.   heparin 5000 UNIT/ML injection Inject 1 mL (5,000 Units total) into the skin every 8 (eight) hours.   hydrALAZINE 20 MG/ML injection Commonly known as:  APRESOLINE Inject 0.25 mLs (5 mg total) into the vein every 6 (six) hours as needed (SBP > 160).   insulin aspart 100 UNIT/ML injection Commonly known as:  novoLOG Inject 0-9 Units into the skin 3 (three) times daily with meals. Replaces:  NOVOLOG FLEXPEN 100 UNIT/ML FlexPen   insulin detemir 100 UNIT/ML injection Commonly known as:  LEVEMIR Inject 0.15 mLs (15 Units total) into the skin daily. Start taking on:  June 10, 2018 What changed:  See the new instructions.   ipratropium-albuterol 0.5-2.5 (3) MG/3ML Soln Commonly known as:  DUONEB Use nebulizer to inhale  contents of one vial every 6 hours as needed for shortness of breath, wheezing   iron polysaccharides 150 MG capsule Commonly known as:  NIFEREX Take 1 capsule (150 mg total) by mouth daily.   nitroGLYCERIN 0.4 MG SL tablet Commonly known as:  NITROSTAT Place 1 tablet (0.4 mg total) under the tongue every 5 (five) minutes as needed for chest pain.   omeprazole 40 MG capsule Commonly known as:  PRILOSEC TAKE 1 CAPSULE BY MOUTH TWICE DAILY.   ondansetron 4 MG tablet Commonly known as:  ZOFRAN Take 1 tablet (4 mg total) by mouth every 6 (six) hours as needed for nausea.   pantoprazole 40 MG tablet Commonly known as:  PROTONIX Take 1 tablet (40 mg total) by mouth daily at 12 noon. Start taking on:  June 10, 2018   polyethylene glycol packet Commonly known as:  MIRALAX / GLYCOLAX Take 17 g by mouth daily.   rosuvastatin 40 MG tablet Commonly known as:  CRESTOR Take 1 tablet (40 mg total) by mouth daily at 6 PM. What changed:    medication strength  how much to take  when to take this   torsemide 20 MG tablet Commonly known as:  DEMADEX Take 3 tablets (60 mg total) by mouth daily. Start taking on:  June 10, 2018      Follow-up Information    Antony Blackbird, MD Follow up.   Specialty:  Family Medicine Contact information: [place] [address] [phone]        Garvin Fila, MD. Schedule an appointment as soon as possible for a visit in 4 week(s).   Specialties:  Neurology, Radiology Contact information: [address] [place] [address] [phone]          Allergies  Allergen Reactions  . Metolazone Other (See Comments)    Dizziness and falling  . Plavix [Clopidogrel Bisulfate] Other (See Comments)    High PRU's - non-responder  . Ibuprofen     Patient stated she can not take ibuprofen.   . Nsaids Other (See Comments)    "I do not take NSAIDs d/t interfering with other meds"       Discharge  Condition: Stable CODE STATUS: Full code Diet recommendation: Cardiac/Diabetic diet      The results of significant diagnostics from this hospitalization (including imaging, microbiology, ancillary and laboratory) are listed below for reference.     Microbiology: Recent Results (from the  past 240 hour(s))  MRSA PCR Screening     Status: None   Collection Time: 06/05/18  5:28 AM  Result Value Ref Range Status   MRSA by PCR NEGATIVE NEGATIVE Final    Comment:        The GeneXpert MRSA Assay (FDA approved for NASAL specimens only), is one component of a comprehensive MRSA colonization surveillance program. It is not intended to diagnose MRSA infection nor to guide or monitor treatment for MRSA infections. Performed at [facility], [place] [address] [place], [place] [address]      Labs: BNP (last 3 results) Recent Labs    01/13/18 1618 01/23/18 1521 05/29/18 1052  BNP 795.9* 617.6* 5,188.4*   Basic Metabolic Panel: Recent Labs  Lab 06/04/18 0824 06/05/18 0129 06/05/18 0602 06/06/18 1022 06/08/18 0322  NA 140 136 140 141 139  K 4.9 6.2* 4.1 3.8 3.7  CL 93* 91* 93* 93* 92*  CO2 42* 34* 41* 42* 41*  GLUCOSE 88 134* 86 79 195*  BUN 21* 21* 19 16 22*  CREATININE 1.02* 1.10* 1.04* 0.94 1.00  CALCIUM 8.4* 8.3* 8.6* 8.6* 8.2*   Liver Function Tests: Recent Labs  Lab 06/06/18 1022 06/08/18 0322  AST 25 21  ALT 14 13  ALKPHOS 67 80  BILITOT 1.5* 1.1  PROT 8.8* 8.5*  ALBUMIN 2.5* 2.5*   No results for input(s): LIPASE, AMYLASE in the last 168 hours. Recent Labs  Lab 06/05/18 0040 06/06/18 1022  AMMONIA 138* 55*   CBC: Recent Labs  Lab 06/03/18 0653 06/04/18 0824 06/05/18 0129 06/06/18 1022 06/08/18 0322  WBC 6.8 8.1 9.1 9.0 8.1  HGB 7.8* 7.9* 8.4* 9.0* 7.9*  HCT 30.3* 32.7* 34.5* 36.2 31.0*  MCV 81.5 82.6 84.1 81.3 83.6  PLT 447* 430* 402* 381 349   Cardiac Enzymes: No results for input(s): CKTOTAL, CKMB, CKMBINDEX, TROPONINI in the  last 168 hours. BNP: Invalid input(s): POCBNP CBG: Recent Labs  Lab 06/08/18 1123 06/08/18 1619 06/08/18 2133 06/09/18 0720 06/09/18 1138  GLUCAP 225* 184* 133* 129* 199*   D-Dimer No results for input(s): DDIMER in the last 72 hours. Hgb A1c No results for input(s): HGBA1C in the last 72 hours. Lipid Profile No results for input(s): CHOL, HDL, LDLCALC, TRIG, CHOLHDL, LDLDIRECT in the last 72 hours. Thyroid function studies No results for input(s): TSH, T4TOTAL, T3FREE, THYROIDAB in the last 72 hours.  Invalid input(s): FREET3 Anemia work up No results for input(s): VITAMINB12, FOLATE, FERRITIN, TIBC, IRON, RETICCTPCT in the last 72 hours. Urinalysis    Component Value Date/Time   COLORURINE AMBER (A) 05/29/2018 1545   APPEARANCEUR CLOUDY (A) 05/29/2018 1545   LABSPEC 1.025 05/29/2018 1545   PHURINE 5.0 05/29/2018 1545   GLUCOSEU NEGATIVE 05/29/2018 1545   HGBUR SMALL (A) 05/29/2018 1545   BILIRUBINUR NEGATIVE 05/29/2018 1545   KETONESUR 5 (A) 05/29/2018 1545   PROTEINUR 100 (A) 05/29/2018 1545   UROBILINOGEN 2.0 (H) 02/21/2015 1914   NITRITE NEGATIVE 05/29/2018 1545   LEUKOCYTESUR NEGATIVE 05/29/2018 1545   Sepsis Labs Invalid input(s): PROCALCITONIN,  WBC,  LACTICIDVEN Microbiology Recent Results (from the past 240 hour(s))  MRSA PCR Screening     Status: None   Collection Time: 06/05/18  5:28 AM  Result Value Ref Range Status   MRSA by PCR NEGATIVE NEGATIVE Final    Comment:        The GeneXpert MRSA Assay (FDA approved for NASAL specimens only), is one component of a comprehensive MRSA colonization  surveillance program. It is not intended to diagnose MRSA infection nor to guide or monitor treatment for MRSA infections. Performed at [facility], [place] [address] [place], [place] [address]     Procedures/Studies: Ct Abdomen Pelvis Wo Contrast  Result Date: 05/29/2018 CLINICAL DATA:  Abdominal distension. Severe anemia. GI bleed. EXAM: CT  ABDOMEN AND PELVIS WITHOUT CONTRAST TECHNIQUE: Multidetector CT imaging of the abdomen and pelvis was performed following the standard protocol without IV contrast. COMPARISON:  06/23/2015 abdominal sonogram. FINDINGS: Lower chest: Mild patchy consolidation in peripheral right middle lobe. Patchy tree-in-bud opacity in the dependent right lower lobe. Parenchymal banding at both lung bases compatible with postinfectious/postinflammatory scarring. Mild cardiomegaly. Right coronary atherosclerosis. Posterior left pleural coarse calcification. Hepatobiliary: Diffuse hepatic steatosis. No definite liver surface irregularity. No liver mass. Contracted gallbladder with no radiopaque cholelithiasis. No biliary ductal dilatation. Pancreas: Normal, with no mass or duct dilation. Spleen: Normal size. No mass. Adrenals/Urinary Tract: Normal adrenals. No hydronephrosis. No renal stones. No contour deforming renal masses. Normal bladder. Stomach/Bowel: Mild gaseous distention of the stomach with gastric fluid level. No definite gastric wall thickening. Normal caliber small bowel with no small bowel wall thickening. Candidate appendix appears normal. Mild sigmoid diverticulosis, with no large bowel wall thickening or significant pericolonic fat stranding. Vascular/Lymphatic: Atherosclerotic nonaneurysmal abdominal aorta. No pathologically enlarged lymph nodes in the abdomen or pelvis. Reproductive: Grossly normal uterus.  No adnexal mass. Other: No pneumoperitoneum. Small to moderate volume ascites. No focal fluid collection. Moderate anasarca. Musculoskeletal: No aggressive appearing focal osseous lesions. IMPRESSION: 1. No evidence of bowel obstruction or acute bowel inflammation. Mild sigmoid diverticulosis, with no evidence of acute diverticulitis. 2. Small to moderate volume ascites. Moderate anasarca. 3. Mild patchy consolidation in the peripheral right middle lobe. Patchy tree-in-bud opacity in the dependent right lower  lobe. Findings suggest a mild bronchopneumonia, with the differential including aspiration. Suggest attention on follow-up chest imaging in 3 months. 4. Diffuse hepatic steatosis. 5.  Aortic Atherosclerosis (ICD10-I70.0). Electronically Signed   By: Ilona Sorrel M.D.   On: 05/29/2018 23:52   Ct Head Wo Contrast  Result Date: 06/04/2018 CLINICAL DATA:  Followup right MCA territory strokes. Altered mental status. EXAM: CT HEAD WITHOUT CONTRAST TECHNIQUE: Contiguous axial images were obtained from the base of the skull through the vertex without intravenous contrast. COMPARISON:  Previous CT and MR examinations 05/31/2018 and 06/01/2018 FINDINGS: Brain: The brainstem and cerebellum appear normal. Left hemisphere appears normal. Old lacunar infarction in the right caudate as seen previously. Low-density in the right parieto-occipital cortex and the right parietal cortical and subcortical brain as seen on previous MR consistent with recent infarction. No evidence of extension. No evidence of hemorrhage. No significant mass effect. No extra-axial collection. No hydrocephalus. Vascular: There is atherosclerotic calcification of the major vessels at the base of the brain. Skull: Negative Sinuses/Orbits: Clear sinuses. Orbits negative except for an old medial wall blowout fracture on the left. Other: None IMPRESSION: Low-density in the right parieto-occipital cortex and right parietal cortical and subcortical brain consistent with recent infarction. No evidence of extension, hemorrhage or significant mass effect. Electronically Signed   By: Nelson Chimes M.D.   On: 06/04/2018 11:57   Ct Head Wo Contrast  Result Date: 05/31/2018 CLINICAL DATA:  Encephalopathy EXAM: CT HEAD WITHOUT CONTRAST TECHNIQUE: Contiguous axial images were obtained from the base of the skull through the vertex without intravenous contrast. COMPARISON:  None. FINDINGS: Brain: Small area of cytotoxic edema appearance along the right occipital  parietal convexity with questionable involvement more anteriorly at the level of the parietal lobe. No definite left-sided edema. No posterior fossa abnormality is seen. There is a lacunar infarct in the right caudate head that has a circumscribed low-density appearance suggesting chronicity on the coronal reformats. Vascular: Negative Skull: Negative Sinuses/Orbits: Negative These results were called by telephone at the time of interpretation on 05/31/2018 at 3:36 pm to Dr. Niel Hummer , who verbally acknowledged these results. IMPRESSION: 1. Moderate, acute to subacute cortex infarct at the right occipitoparietal convexity. 2. Remote lacunar infarct in the right caudate. 3. Motion degraded. Electronically Signed   By: Monte Fantasia M.D.   On: 05/31/2018 15:37   Mr Virgel Paling KX Contrast  Result Date: 06/01/2018 CLINICAL DATA:  58 year old female with heart failure, iron deficiency anemia, chronic kidney disease. Weakness, encephalopathy. EXAM: MRI HEAD WITHOUT CONTRAST MRA HEAD WITHOUT CONTRAST TECHNIQUE: Multiplanar, multiecho pulse sequences of the brain and surrounding structures were obtained without intravenous contrast. Angiographic images of the head were obtained using MRA technique without contrast. COMPARISON:  Head CT 05/31/2018 FINDINGS: MRI HEAD FINDINGS Brain: The examination had to be discontinued prior to completion due to patient motion. Sagittal T1 weighted imaging was not obtained, and other sequences are intermittently degraded by motion. Additionally, T2* imaging, time-of-flight MRA, and diffusion imaging to a lesser extent are degraded by extensive intravascular susceptibility which is probably related to recent intravenous iron administration. There is a 2 centimeter area of restricted diffusion in the inferior right parietal lobe posteriorly on series 5, image 54. There is associated gyriform cytotoxic edema. No significant mass effect and no definite hemorrhage. Additional patchy 3-4  centimeter area of abnormal diffusion in the posterior right frontal lobe and right parietal lobe white matter on series 5, image 57 is isointense on ADC with mild T2 and FLAIR hyperintensity. No mass effect. No left hemisphere or posterior fossa restricted diffusion. No midline shift, mass effect, evidence of mass lesion, ventriculomegaly, extra-axial collection or extra-axial hemorrhage. Cervicomedullary junction and pituitary are within normal limits. Questionable small area of cortical encephalomalacia in the left superior parietal lobe on series 13, image 20. Small chronic lacunar infarct in the right caudate. Vascular: Major intracranial vascular flow voids are preserved. Skull and upper cervical spine: Not well evaluated in the absence of sagittal T1 weighted imaging. Sinuses/Orbits: Negative orbits. Trace fluid layering in the right sphenoid sinus. Other paranasal sinuses are well pneumatized. Chronic left lamina papyracea fracture suspected. Other: Trace left mastoid effusion. Trace retained secretions in the nasopharynx. The right mastoids are clear. Scalp and face soft tissues appear negative. MRA HEAD FINDINGS Severely limited by a combination of motion and vascular susceptibility artifact probably related to recent intravenous iron administration. The distal vertebral arteries and basilar artery appear to be patent. Both ICA siphons and carotid termini appear to be patent. The 1st order circle-of-Willis branches (A1, M1, and P1) appear to be patent bilaterally. IMPRESSION: 1. Study limited by suspected recent intravenous iron administration, and also motion artifact. 2. Positive for Acute 2 cm Infarct in the inferior right parietal lobe, Right MCA/PCA watershed area corresponding to the recent CT finding. No associated mass effect. 3. Superimposed patchy Subacute Infarct in the posterior Right MCA territory (series 5, image 57). No mass effect. 4. Limited MRA suggests no intracranial large vessel  occlusion. 5. Chronic lacunar infarct right caudate nucleus. Electronically Signed   By: Genevie Ann M.D.   On: 06/01/2018 07:02   Mr Brain Wo Contrast  Result Date:  06/01/2018 CLINICAL DATA:  58 year old female with heart failure, iron deficiency anemia, chronic kidney disease. Weakness, encephalopathy. EXAM: MRI HEAD WITHOUT CONTRAST MRA HEAD WITHOUT CONTRAST TECHNIQUE: Multiplanar, multiecho pulse sequences of the brain and surrounding structures were obtained without intravenous contrast. Angiographic images of the head were obtained using MRA technique without contrast. COMPARISON:  Head CT 05/31/2018 FINDINGS: MRI HEAD FINDINGS Brain: The examination had to be discontinued prior to completion due to patient motion. Sagittal T1 weighted imaging was not obtained, and other sequences are intermittently degraded by motion. Additionally, T2* imaging, time-of-flight MRA, and diffusion imaging to a lesser extent are degraded by extensive intravascular susceptibility which is probably related to recent intravenous iron administration. There is a 2 centimeter area of restricted diffusion in the inferior right parietal lobe posteriorly on series 5, image 54. There is associated gyriform cytotoxic edema. No significant mass effect and no definite hemorrhage. Additional patchy 3-4 centimeter area of abnormal diffusion in the posterior right frontal lobe and right parietal lobe white matter on series 5, image 57 is isointense on ADC with mild T2 and FLAIR hyperintensity. No mass effect. No left hemisphere or posterior fossa restricted diffusion. No midline shift, mass effect, evidence of mass lesion, ventriculomegaly, extra-axial collection or extra-axial hemorrhage. Cervicomedullary junction and pituitary are within normal limits. Questionable small area of cortical encephalomalacia in the left superior parietal lobe on series 13, image 20. Small chronic lacunar infarct in the right caudate. Vascular: Major intracranial  vascular flow voids are preserved. Skull and upper cervical spine: Not well evaluated in the absence of sagittal T1 weighted imaging. Sinuses/Orbits: Negative orbits. Trace fluid layering in the right sphenoid sinus. Other paranasal sinuses are well pneumatized. Chronic left lamina papyracea fracture suspected. Other: Trace left mastoid effusion. Trace retained secretions in the nasopharynx. The right mastoids are clear. Scalp and face soft tissues appear negative. MRA HEAD FINDINGS Severely limited by a combination of motion and vascular susceptibility artifact probably related to recent intravenous iron administration. The distal vertebral arteries and basilar artery appear to be patent. Both ICA siphons and carotid termini appear to be patent. The 1st order circle-of-Willis branches (A1, M1, and P1) appear to be patent bilaterally. IMPRESSION: 1. Study limited by suspected recent intravenous iron administration, and also motion artifact. 2. Positive for Acute 2 cm Infarct in the inferior right parietal lobe, Right MCA/PCA watershed area corresponding to the recent CT finding. No associated mass effect. 3. Superimposed patchy Subacute Infarct in the posterior Right MCA territory (series 5, image 57). No mass effect. 4. Limited MRA suggests no intracranial large vessel occlusion. 5. Chronic lacunar infarct right caudate nucleus. Electronically Signed   By: Genevie Ann M.D.   On: 06/01/2018 07:02   Dg Chest Port 1 View  Result Date: 06/05/2018 CLINICAL DATA:  Respiratory failure, post intubation EXAM: PORTABLE CHEST 1 VIEW COMPARISON:  Portable exam 2359 hours compared to 05/30/2018 FINDINGS: Endotracheal tube tip projects 4.8 cm above carina. Enlargement of cardiac silhouette. Mediastinal contours normal. Improved RIGHT lung infiltrates though mild residual seen at both the RIGHT upper lobe and at the RIGHT base. Scarring at the lateral LEFT hemithorax unchanged. No gross pleural effusion or pneumothorax.  IMPRESSION: Enlargement of cardiac silhouette. LEFT lung scarring. Improved RIGHT lung infiltrates. Electronically Signed   By: Lavonia Dana M.D.   On: 06/05/2018 00:10   Dg Chest Port 1 View  Result Date: 05/30/2018 CLINICAL DATA:  Assess for congestive heart failure. Concern for aspiration pneumonia. EXAM: PORTABLE CHEST 1 VIEW COMPARISON:  Chest radiograph performed 01/13/2018 FINDINGS: Diffuse right-sided airspace opacification may reflect asymmetric pulmonary edema or pneumonia. Underlying aspiration cannot be excluded, though the pattern is not particularly indicative of aspiration. No significant pleural effusion or pneumothorax is seen. The cardiomediastinal silhouette is borderline normal in size. No acute osseous abnormalities are identified. IMPRESSION: Diffuse right-sided airspace opacification may reflect asymmetric pulmonary edema or pneumonia. Underlying aspiration cannot be excluded, given clinical concern, though the pattern is not particularly indicative of aspiration. Electronically Signed   By: Garald Balding M.D.   On: 05/30/2018 05:37   Dg Abd Portable 1v  Result Date: 06/05/2018 CLINICAL DATA:  OG tube placement. EXAM: PORTABLE ABDOMEN - 1 VIEW COMPARISON:  CT 05/29/2018 FINDINGS: Tip and side port of the enteric tube below the diaphragm in the stomach. Nonobstructive bowel gas pattern in the upper abdomen. Soft tissue attenuation from habitus limits detailed assessment. IMPRESSION: Tip and side port of the enteric tube below the diaphragm in the stomach. Electronically Signed   By: Keith Rake M.D.   On: 06/05/2018 03:48   Vas US Carotid (at [facility] Only)  Result Date: 06/01/2018 Carotid Arterial Duplex Study Indications:       CVA. Risk Factors:      Hypertension. Other Factors:     Heart failure. Limitations:       patient has labored breathing; short neck; line on site;                    stiffness. Comparison Study:  No comparison study available Performing Technologist:  Rudell Cobb  Examination Guidelines: A complete evaluation includes B-mode imaging, spectral Doppler, color Doppler, and power Doppler as needed of all accessible portions of each vessel. Bilateral testing is considered an integral part of a complete examination. Limited examinations for reoccurring indications may be performed as noted.  Right Carotid Findings: +----------+-------+--------+--------+--------------------------------+--------+           PSV    EDV cm/sStenosisDescribe                        Comments           cm/s                                                            +----------+-------+--------+--------+--------------------------------+--------+ CCA Prox  152    31                                              tortuous +----------+-------+--------+--------+--------------------------------+--------+ CCA Distal69     24                                                       +----------+-------+--------+--------+--------------------------------+--------+ ICA Prox  70     25      1-39%   diffuse, hyperechoic and  irregular                                +----------+-------+--------+--------+--------------------------------+--------+ ICA Mid   89     38                                                       +----------+-------+--------+--------+--------------------------------+--------+ ICA Distal134    42                                                       +----------+-------+--------+--------+--------------------------------+--------+ ECA       68     15                                                       +----------+-------+--------+--------+--------------------------------+--------+ +----------+--------+-------+--------+-------------------+           PSV cm/sEDV cmsDescribeArm Pressure (mmHG) +----------+--------+-------+--------+-------------------+ [id number]                                          +----------+--------+-------+--------+-------------------+ +---------+--------+---+--------+--+---------+ VertebralPSV cm/s101EDV cm/s38Antegrade +---------+--------+---+--------+--+---------+  Left Carotid Findings: +----------+--------+--------+--------+------------------+--------+           PSV cm/sEDV cm/sStenosisDescribe          Comments +----------+--------+--------+--------+------------------+--------+ CCA Prox  101     32                                         +----------+--------+--------+--------+------------------+--------+ CCA Distal93      20                                         +----------+--------+--------+--------+------------------+--------+ ICA Prox  84      24      1-39%   focal and calcific         +----------+--------+--------+--------+------------------+--------+ ICA Mid   102     37                                         +----------+--------+--------+--------+------------------+--------+ ICA Distal125     52                                         +----------+--------+--------+--------+------------------+--------+ ECA       93                                                 +----------+--------+--------+--------+------------------+--------+ +----------+--------+--------+--------+-------------------+  SubclavianPSV cm/sEDV cm/sDescribeArm Pressure (mmHG) +----------+--------+--------+--------+-------------------+           102                                         +----------+--------+--------+--------+-------------------+ +---------+--------+---+--------+--+---------+ VertebralPSV cm/s100EDV cm/s37Antegrade +---------+--------+---+--------+--+---------+ IJV extendedly enlarged.  Summary: Right Carotid: Velocities in the right ICA are consistent with a 1-39% stenosis. Left Carotid: Velocities in the left ICA are consistent with a 1-39% stenosis.               Left IJV extendedly  enlarged. Vertebrals: Bilateral vertebral arteries demonstrate antegrade flow. *See table(s) above for measurements and observations.  Electronically signed by Curt Jews MD on 06/01/2018 at 5:41:51 PM.    Final    Vas [place] Lower Extremity Venous (dvt)  Result Date: 06/01/2018  Lower Venous Study Indications: Embolic stroke.  Limitations: Body habitus, line and Immobility. Comparison Study: No prior study available Performing Technologist: Rudell Cobb  Examination Guidelines: A complete evaluation includes B-mode imaging, spectral Doppler, color Doppler, and power Doppler as needed of all accessible portions of each vessel. Bilateral testing is considered an integral part of a complete examination. Limited examinations for reoccurring indications may be performed as noted.  Right Venous Findings: +---------+---------------+---------+-----------+----------+-------------------+          CompressibilityPhasicitySpontaneityPropertiesSummary             +---------+---------------+---------+-----------+----------+-------------------+ CFV      Full           Yes      Yes                                      +---------+---------------+---------+-----------+----------+-------------------+ SFJ      Full                                                             +---------+---------------+---------+-----------+----------+-------------------+ FV Prox  Full                                                             +---------+---------------+---------+-----------+----------+-------------------+ FV Mid   Full                                                             +---------+---------------+---------+-----------+----------+-------------------+ FV Distal                        Yes                  Unable to visualiz  with compression    +---------+---------------+---------+-----------+----------+-------------------+ PFV       Full                                                             +---------+---------------+---------+-----------+----------+-------------------+ POP      Full           Yes      Yes                                      +---------+---------------+---------+-----------+----------+-------------------+ PTV      Full                                                             +---------+---------------+---------+-----------+----------+-------------------+ PERO     Full                                                             +---------+---------------+---------+-----------+----------+-------------------+  Left Venous Findings: +---------+---------------+---------+-----------+----------+-------------------+          CompressibilityPhasicitySpontaneityPropertiesSummary             +---------+---------------+---------+-----------+----------+-------------------+ CFV      Full           Yes      Yes                                      +---------+---------------+---------+-----------+----------+-------------------+ SFJ      Full                                                             +---------+---------------+---------+-----------+----------+-------------------+ FV Prox  Full                                                             +---------+---------------+---------+-----------+----------+-------------------+ FV Mid   Full                                                             +---------+---------------+---------+-----------+----------+-------------------+ FV DistalFull  Unable to visualiz                                                        with compression    +---------+---------------+---------+-----------+----------+-------------------+ PFV      Full                                                              +---------+---------------+---------+-----------+----------+-------------------+ POP      Full           Yes      Yes                                      +---------+---------------+---------+-----------+----------+-------------------+ PTV      Full                                                             +---------+---------------+---------+-----------+----------+-------------------+ PERO     Full                                                             +---------+---------------+---------+-----------+----------+-------------------+ Prominent lymph node see at groin area.    Summary: Right: There is no evidence of deep vein thrombosis in the lower extremity. However, portions of this examination were limited- see technologist comments above. No cystic structure found in the popliteal fossa. Left: There is no evidence of deep vein thrombosis in the lower extremity. However, portions of this examination were limited- see technologist comments above. No cystic structure found in the popliteal fossa.  *See table(s) above for measurements and observations. Electronically signed by Curt Jews MD on 06/01/2018 at 5:41:34 PM.    Final    Ct Head Wo Contrast (charm Study)  Result Date: 06/05/2018 CLINICAL DATA:  Encephalopathy. Recent right MCA infarct. EXAM: CT HEAD WITHOUT CONTRAST TECHNIQUE: Contiguous axial images were obtained from the base of the skull through the vertex without intravenous contrast. COMPARISON:  Most recent head CT yesterday at 1148 hour, brain MRI 06/01/2018 FINDINGS: Brain: Progressive cortical low-density in the right parietooccipital lobe at site of infarct on MRI. Additional developing low-density in the posterior right frontal and right parietal lobes at site of infarcts on MRI. No evidence of hemorrhagic conversion or suspicious mass effect. Remote lacunar infarct in the right caudate. No hydrocephalus or midline shift. No evidence of acute ischemia, mild motion  artifact limitations. Vascular: Atherosclerosis of skullbase vasculature without hyperdense vessel or abnormal calcification. Skull: No fracture or focal lesion. Sinuses/Orbits: No opacification of ethmoid air cells with mucosal thickening may be secondary to intubation. Small left mastoid effusion, unchanged. Other: None. IMPRESSION: 1. Expected evolution of recent parietooccipital and frontoparietal infarcts seen  on MRI. No hemorrhagic conversion or significant mass effect. 2. No new abnormality. Electronically Signed   By: Keith Rake M.D.   On: 06/05/2018 01:31   (Echo, Carotid, EGD, Colonoscopy, ERCP)  Time coordinating discharge: Over 30 minutes  SIGNED:   Guilford Shi, MD  [facility] 06/09/2018, 12:53 PM Pager   If 7PM-7AM, please contact night-coverage [email or web address] Password [email or web address]

## 2018-06-09 NOTE — Progress Notes (Signed)
Jamse Arn, MD  Physician  Physical Medicine and Rehabilitation  Consult Note  Signed  Date of Service:  06/07/2018 11:57 AM       Related encounter: ED to Hosp-Admission (Current) from 05/29/2018 in [facility]      Signed         Show:Clear all [x] Manual[x] Template[] Copied  Added by: [x] Love, Ivan Anchors, PA-C[x] Jamse Arn, MD  [] Hover for details      Physical Medicine and Rehabilitation Consult  Reason for Consult: Functional decline.  Referring Physician: Dr. Barth Kirks.    HPI: Tamara Silva is a 58 y.o. female with history of T2DM, asthma, COPD with chronic hypoxic respiratory failure-Oxygen dependent, OSA--noncompliant with CPAP, anemia, morbid obesity- BMI 50 who was admitted to [facility] on 05/29/18 with generalized weakness, lethargy, acute on chronic anemia with Hgb 5.7 and slurred speech.  History taken from chart review.  Family reported confusion for couple of days PTA. Ammonia level elevated at 94 and UDS positive for cocaine. She was treated with diuretics for acute on chronic biventricular HF, placed on  BIPAP and transfused with 2 units PRBC. Cardiology consulted for input and recommended holding ASA and parasugel till source of bleeding found.  CT head done 1/1 reviewed, showing right posterior brain infarct.  Due to lethargy and revealed acute to subacute right occipitoparietal infarct with small areas of cytotoxic edema and she was transferred to [facility] for management.   Neurology felt that stroke most likely cardioembolic but patient not good long term AC candidate due to chronic anemia and to add ASA back once H/H stable.   She was weaned to CPAP but has been noncompliant. She developed mental status changes on 1/5 and placed on BIPAP but was non-compliant with use. Later that day she developed progressive hypoxia with unresponsiveness and generalized seizure type activity requiring intubation. She was loaded with Keppra and follow  up CT head without extension or hemorrhage.  She tolerated extubation to BIPAP 1/6 and started on dysphagia 3, thins. Acute on chronic CHF improving--lasix transitioned to demadex and respiratory failure felt to multifactorial due to OHS, sleep apnea and COPD. Therapy evaluations completed revealing delayed processing with impulsivity, balance deficits and difficult with mobility. CIR recommended due to functional decline.   Review of Systems  Constitutional: Negative for chills, diaphoresis and fever.  HENT: Negative for hearing loss and tinnitus.   Eyes: Positive for blurred vision. Negative for double vision.  Respiratory: Positive for shortness of breath (with minimal activity).   Cardiovascular: Positive for leg swelling. Negative for chest pain and palpitations.  Gastrointestinal: Positive for abdominal pain, constipation and heartburn.  Genitourinary: Negative for dysuria and urgency.  Musculoskeletal: Negative for back pain, joint pain, myalgias and neck pain.  Skin: Negative for itching and rash.  Neurological: Positive for weakness. Negative for dizziness, tingling and headaches.  Psychiatric/Behavioral: The patient is not nervous/anxious.   All other systems reviewed and are negative.       Past Medical History:  Diagnosis Date  . Asthma   . CAD (coronary artery disease)   . Chronic combined systolic (congestive) and diastolic (congestive) heart failure ([place])   . Chronic iron deficiency anemia   . Diabetes mellitus without complication ([place])   . DOE (dyspnea on exertion)   . Edema of both legs   . Hypertension   . Pulmonary emboli ([place]) 2014    Past Surgical History:  Procedure Laterality Date  . COLONOSCOPY WITH PROPOFOL N/A 08/03/2013   Procedure: COLONOSCOPY  WITH PROPOFOL;  Surgeon: Jeryl Columbia, MD;  Location: [facility] ENDOSCOPY;  Service: Endoscopy;  Laterality: N/A;  . CORONARY ANGIOPLASTY WITH STENT PLACEMENT     CAD in 2006 x 2 and 2009 2 more- place  din REx in [place] and [place] [facility]  . CORONARY ANGIOPLASTY WITH STENT PLACEMENT  10/07/2013   Xience [place] DES 2.75  mm x 15  mm  . ESOPHAGOGASTRODUODENOSCOPY (EGD) WITH PROPOFOL N/A 08/03/2013   Procedure: ESOPHAGOGASTRODUODENOSCOPY (EGD) WITH PROPOFOL;  Surgeon: Jeryl Columbia, MD;  Location: [facility] ENDOSCOPY;  Service: Endoscopy;  Laterality: N/A;  . LEFT HEART CATHETERIZATION WITH CORONARY ANGIOGRAM N/A 10/07/2013   Procedure: LEFT HEART CATHETERIZATION WITH CORONARY ANGIOGRAM;  Surgeon: Leonie Man, MD;  Location: [facility] CATH LAB;  Service: Cardiovascular;  Laterality: N/A;         Family History  Problem Relation Age of Onset  . Stroke Mother   . Hypertension Mother   . Colon cancer Father   . Diabetes Father   . Heart attack Father   . Sarcoidosis Other   . Breast cancer Paternal Aunt   . Emphysema Brother   . Lung disease Brother        Unknown type, 3 brothers, one with liver and lung disease  . Asthma Paternal Aunt     Social History:  Lives with boyfriend who manages home. She has been sedentary with decline in activity tolerance for the past 6 months. She reports that she quit smoking about 4 years ago. Her smoking use included cigarettes. She has a 10.00 pack-year smoking history. She has never used smokeless tobacco. She reports that she does not drink alcohol or use drugs.         Allergies  Allergen Reactions  . Metolazone Other (See Comments)    Dizziness and falling  . Plavix [Clopidogrel Bisulfate] Other (See Comments)    High PRU's - non-responder  . Ibuprofen     Patient stated she can not take ibuprofen.   . Nsaids Other (See Comments)    "I do not take NSAIDs d/t interfering with other meds"          Medications Prior to Admission  Medication Sig Dispense Refill  . ACCU-CHEK AVIVA PLUS test strip USE 1 STRIP TO CHECK GLUCOSE 3 TIMES DAILY 100 each 0  . ACCU-CHEK FASTCLIX LANCETS MISC 1 applicator by Does not apply route 4 (four)  times daily -  before meals and at bedtime. 100 each 11  . ACCU-CHEK SOFTCLIX LANCETS lancets USE 1 LANCET TO CHECK GLUCOSE 3 TIMES DAILY 100 each 10  . aspirin 81 MG tablet Take 1 tablet (81 mg total) by mouth daily.    . Blood Glucose Monitoring Suppl (ACCU-CHEK AVIVA) device Use as instructed 3 times daily before meals. 1 each 0  . carvedilol (COREG) 12.5 MG tablet TAKE 1 TABLET BY MOUTH 2 TIMES A DAY WITH A MEAL **PT NEEDS TO SCHEDULE AN APPT WITH OUR OFFICE AT [phone] FOR FUTURE REFILLS 60 tablet 1  . fluticasone (FLONASE) 50 MCG/ACT nasal spray INSTILL 2 SPRAYS IN EACH NOSTRIL TWICE DAILY. 1 g 1  . furosemide (LASIX) 40 MG tablet Take 1.5 tablets (60 mg total) by mouth 2 (two) times daily. 90 tablet 0  . Insulin Pen Needle (TRUEPLUS PEN NEEDLES) 32G X 4 MM MISC Use three times daily to inject insulin 100 each 11  . INSULIN SYRINGE .5CC/29G 29G X 1/2" 0.5 ML MISC Use as instructed by physician 100 each 12  .  ipratropium-albuterol (DUONEB) 0.5-2.5 (3) MG/3ML SOLN Use nebulizer to inhale contents of one vial every 6 hours as needed for shortness of breath, wheezing 360 mL 11  . LEVEMIR 100 UNIT/ML injection INJECT 45 UNITS SUBCUTANEOUSLY EVERY NIGHT AT BEDTIME *MUST HAVE OFFICE VISIT FOR REFILLS* 2 vial 0  . losartan (COZAAR) 50 MG tablet Take 1 tablet (50 mg total) by mouth daily. 90 tablet 1  . nitroGLYCERIN (NITROSTAT) 0.4 MG SL tablet Place 1 tablet (0.4 mg total) under the tongue every 5 (five) minutes as needed for chest pain. 25 tablet 11  . NOVOLOG FLEXPEN 100 UNIT/ML FlexPen INJECT 15-20 UNITS SUBCUTANEOUSLY 3 TIMES DAILY WITH MEALS. **PATIENT MUST HAVE OFFICE VISIT FOR FUTURE REFILLS** 2 pen 0  . omeprazole (PRILOSEC) 40 MG capsule TAKE 1 CAPSULE BY MOUTH TWICE DAILY. 60 capsule 1  . polyethylene glycol (MIRALAX / GLYCOLAX) packet Take 17 g by mouth daily. 30 each 6  . prasugrel (EFFIENT) 10 MG TABS tablet TAKE ONE TABLET BY MOUTH DAILY. 30 tablet 0  . rosuvastatin (CRESTOR) 10  MG tablet Take 1 tablet (10 mg total) by mouth daily. 90 tablet 2  . spironolactone (ALDACTONE) 25 MG tablet Take 1 tablet (25 mg total) by mouth daily. 90 tablet 3  . iron polysaccharides (NIFEREX) 150 MG capsule Take 1 capsule (150 mg total) by mouth daily. (Patient not taking: Reported on 05/29/2018) 30 capsule 11  . prazosin (MINIPRESS) 5 MG capsule TAKE 1 CAPSULE BY MOUTH EVERY NIGHT AT BEDTIME FOR NIGHTMARES. (Patient not taking: Reported on 05/29/2018) 30 capsule 0  . PROAIR HFA 108 (90 Base) MCG/ACT inhaler INHALE 2 PUFFS BY MOUTH 4 TIMES DAILY AS NEEDED (Patient not taking: Reported on 05/29/2018) 8.5 each 2  . senna (SENOKOT) 8.6 MG TABS tablet Take 1 tablet (8.6 mg total) by mouth 2 (two) times daily. (Patient not taking: Reported on 05/29/2018) 120 each 0    Home: Home Living Family/patient expects to be discharged to:: Private residence Living Arrangements: Spouse/significant other(boyfriend per chart, per pt pt lives with roommate) Available Help at Discharge: Family, Friend(s) Type of Home: House Home Access: Stairs to enter Technical brewer of Steps: 2 Home Layout: One level Bathroom Shower/Tub: Chiropodist: Handicapped height Home Equipment: [place] - single point, Bedside commode Additional Comments: PLOF obtained via chart review from admission in 12/2017  Functional History: Prior Function Level of Independence: Needs assistance Gait / Transfers Assistance Needed: pt not able to state, pt was ambulating approx 10-20' using RW with PT and minguard assist (+2 safety) during recent admission in 12/2017 ADL's / Homemaking Assistance Needed: was receiving assist for bathing/dressing ADLs; per chart review pt's significant other assisted with bating at bed level Functional Status:  Mobility: Bed Mobility Overal bed mobility: Needs Assistance Bed Mobility: Supine to Sit Supine to sit: Min assist Sit to supine: Max assist, +2 for physical assistance,  +2 for safety/equipment General bed mobility comments: Pt demonstrates difficulty intiating, sequencing and problem solving through how to move to EOB.  She initially turns on her Lt side, and then stops.  She requires cues and instruction to re-initiate and complete task  Transfers Overall transfer level: Needs assistance Equipment used: 2 person hand held assist Transfers: Sit to/from Stand Sit to Stand: Mod assist, +2 safety/equipment, +2 physical assistance Stand pivot transfers: Mod assist, +2 physical assistance, +2 safety/equipment General transfer comment: cues to initiate activity and cues for hand placement.  She stands with assist to steady, and then step by step  cues for pivot, and assist to maintain balance and control descent  Ambulation/Gait Ambulation/Gait assistance: Mod assist, +2 physical assistance, +2 safety/equipment Gait Distance (Feet): 1 Feet Assistive device: 2 person hand held assist Gait Pattern/deviations: Leaning posteriorly, Step-to pattern General Gait Details: Took pivotal steps from bed to recliner with modA+2 and bilateral HHA to prevent posterior LOB; step by step cues and to prevent premature sitting Gait velocity: Decreased  ADL: ADL Overall ADL's : Needs assistance/impaired Eating/Feeding: Minimal assistance, Sitting Eating/Feeding Details (indicate cue type and reason): pt request applesauce, but when it is handed to her, she passively stares at it, and at times, refuses to self feed.  Cues provided to perform on her own.  Once she gets started, she is able to continue task with supervision  Grooming: Wash/dry hands, Minimal assistance, Sitting Grooming Details (indicate cue type and reason): assist to initiate and for thoroughness  Upper Body Bathing: Maximal assistance, Sitting Lower Body Bathing: Maximal assistance, Sit to/from stand Upper Body Dressing : Maximal assistance, Sitting Upper Body Dressing Details (indicate cue type and reason):  requires increased assist due to poor sitting balance Lower Body Dressing: Total assistance, Sit to/from stand Toilet Transfer: Moderate assistance, BSC Toileting- Clothing Manipulation and Hygiene: Total assistance, Sit to/from stand Functional mobility during ADLs: Moderate assistance, +2 for physical assistance, +2 for safety/equipment General ADL Comments: pt with decreased balance, endurance, impaired cognition and suspect impaired vision  Cognition: Cognition Overall Cognitive Status: Impaired/Different from baseline Arousal/Alertness: Awake/alert Orientation Level: Oriented X4 Attention: Focused Focused Attention: Impaired Focused Attention Impairment: Verbal basic, Functional basic Memory: Impaired Memory Impairment: Decreased recall of new information Awareness: Appears intact Problem Solving: Impaired Problem Solving Impairment: Verbal basic Executive Function: Organizing, Self Monitoring, Self Correcting Organizing: Impaired Organizing Impairment: Verbal basic Self Monitoring: Impaired Self Monitoring Impairment: Verbal basic Self Correcting: Impaired Self Correcting Impairment: Verbal basic Behaviors: Perseveration, Confabulation Safety/Judgment: Impaired Cognition Arousal/Alertness: Awake/alert Behavior During Therapy: Flat affect Overall Cognitive Status: Impaired/Different from baseline Area of Impairment: Attention, Memory, Following commands, Safety/judgement, Awareness, Problem solving Current Attention Level: Sustained, Focused Memory: Decreased short-term memory Following Commands: Follows one step commands consistently, Follows one step commands with increased time Safety/Judgement: Decreased awareness of safety, Decreased awareness of deficits Awareness: Emergent Problem Solving: Slow processing, Decreased initiation, Difficulty sequencing, Requires verbal cues, Requires tactile cues General Comments: Pt demonstrates focused to sustained attention, and  is unable to shift attention.  She is very slow to respond to questions and very slow to initiate.  She will, at times, fixate on something, and difficult to redirect her attention.   Mod cues for simple problem solving    Blood pressure 132/66, pulse 78, temperature 97.7 F (36.5 C), temperature source Oral, resp. rate 16, height 5' 7.5" (1.715 m), weight (!) 147 kg, last menstrual period 07/28/2013, SpO2 99 %. Physical Exam  Nursing note and vitals reviewed. Constitutional: She is oriented to person, place, and time. She appears well-developed. No distress.  Morbidly obese.   HENT:  Head: Normocephalic and atraumatic.  Eyes: EOM are normal. Right eye exhibits no discharge. Left eye exhibits no discharge.  Neck: Normal range of motion. Neck supple.  Cardiovascular: Normal rate and regular rhythm.  Respiratory: Effort normal and breath sounds normal.  + [place]  GI: She exhibits distension. There is abdominal tenderness.  Musculoskeletal:        General: Edema present.  Neurological: She is alert and oriented to person, place, and time.  Speech slow and measured with dysarthria.  Delayed processing.  She was slow to initiate, distracted and perseverative at times but mentation improved as exam progressed.  Motor: Grossly 4-4+/5 throughout   Skin: She is not diaphoretic.  Dry flaky skin on abdomen. Skin folds with Interdry AG cloths.   Psychiatric: Her affect is blunt. Her speech is delayed. She is slowed.         Results for orders placed or performed during the hospital encounter of 05/29/18 (from the past 24 hour(s))  Glucose, capillary     Status: Abnormal   Collection Time: 06/06/18  3:20 PM  Result Value Ref Range   Glucose-Capillary 142 (H) 70 - 99 mg/dL  Glucose, capillary     Status: Abnormal   Collection Time: 06/06/18  7:39 PM  Result Value Ref Range   Glucose-Capillary 228 (H) 70 - 99 mg/dL  Glucose, capillary     Status: Abnormal   Collection Time: 06/07/18   1:05 AM  Result Value Ref Range   Glucose-Capillary 229 (H) 70 - 99 mg/dL  Glucose, capillary     Status: Abnormal   Collection Time: 06/07/18  1:48 AM  Result Value Ref Range   Glucose-Capillary 225 (H) 70 - 99 mg/dL  Glucose, capillary     Status: Abnormal   Collection Time: 06/07/18  4:21 AM  Result Value Ref Range   Glucose-Capillary 183 (H) 70 - 99 mg/dL  Glucose, capillary     Status: Abnormal   Collection Time: 06/07/18  7:35 AM  Result Value Ref Range   Glucose-Capillary 154 (H) 70 - 99 mg/dL  Glucose, capillary     Status: Abnormal   Collection Time: 06/07/18 11:08 AM  Result Value Ref Range   Glucose-Capillary 201 (H) 70 - 99 mg/dL   No results found.  Assessment/Plan: Diagnosis: Debility Labs and images (see above) independently reviewed.  Records reviewed and summated above.  1. Does the need for close, 24 hr/day medical supervision in concert with the patient's rehab needs make it unreasonable for this patient to be served in a less intensive setting? Yes  2. Co-Morbidities requiring supervision/potential complications: dysphagia (advance diet as tolerated), seizures (continue meds), non-compliant (educate), ABLA (repeat labs, transfuse to ensure appropriate perfusion for increased activity tolerance), CHF (Monitor in accordance with increased physical activity and avoid UE resistance excercises), cocaine abuse (counsel), T2 DM (Monitor in accordance with exercise and adjust meds as necessary), asthma, COPD with chronic hypoxic respiratory failure-Oxygen dependent, OSA--noncompliant with CPAP, morbid obesity 3. Due to bladder management, bowel management, disease management, pain management and patient education, does the patient require 24 hr/day rehab nursing? Yes 4. Does the patient require coordinated care of a physician, rehab nurse, PT (1-2 hrs/day, 5 days/week), OT (1-2 hrs/day, 5 days/week) and SLP (1-2 hrs/day, 5 days/week) to address physical and  functional deficits in the context of the above medical diagnosis(es)? Yes Addressing deficits in the following areas: balance, endurance, locomotion, strength, transferring, bathing, dressing, toileting, cognition and psychosocial support 5. Can the patient actively participate in an intensive therapy program of at least 3 hrs of therapy per day at least 5 days per week? Potentially 6. The potential for patient to make measurable gains while on inpatient rehab is excellent 7. Anticipated functional outcomes upon discharge from inpatient rehab are supervision  with PT, supervision with OT, modified independent and supervision with SLP. 8. Estimated rehab length of stay to reach the above functional goals is: 16-19 days. 9. Anticipated D/C setting: Home 10. Anticipated post D/C treatments: HH therapy and Home excercise  program 11. Overall Rehab/Functional Prognosis: excellent and good  RECOMMENDATIONS: This patient's condition is appropriate for continued rehabilitative care in the following setting: CIR caregiver support available upon discharge and patient willing to participate in 3 hours of therapy per day. Patient has agreed to participate in recommended program. Potentially Note that insurance prior authorization may be required for reimbursement for recommended care.  Comment: Rehab Admissions Coordinator to follow up.   I have personally performed a face to face diagnostic evaluation, including, but not limited to relevant history and physical exam findings, of this patient and developed relevant assessment and plan.  Additionally, I have reviewed and concur with the physician assistant's documentation above.   Delice Lesch, MD, ABPMR Bary Leriche, PA-C 06/07/2018        Revision History                             Routing History

## 2018-06-10 ENCOUNTER — Inpatient Hospital Stay (HOSPITAL_COMMUNITY): Payer: Self-pay | Admitting: Physical Therapy

## 2018-06-10 ENCOUNTER — Inpatient Hospital Stay (HOSPITAL_COMMUNITY): Payer: Self-pay

## 2018-06-10 ENCOUNTER — Inpatient Hospital Stay (HOSPITAL_COMMUNITY): Payer: Self-pay | Admitting: Occupational Therapy

## 2018-06-10 DIAGNOSIS — E7220 Disorder of urea cycle metabolism, unspecified: Secondary | ICD-10-CM

## 2018-06-10 DIAGNOSIS — E669 Obesity, unspecified: Secondary | ICD-10-CM

## 2018-06-10 DIAGNOSIS — K5901 Slow transit constipation: Secondary | ICD-10-CM

## 2018-06-10 DIAGNOSIS — I5033 Acute on chronic diastolic (congestive) heart failure: Secondary | ICD-10-CM

## 2018-06-10 DIAGNOSIS — E1169 Type 2 diabetes mellitus with other specified complication: Secondary | ICD-10-CM

## 2018-06-10 DIAGNOSIS — R062 Wheezing: Secondary | ICD-10-CM

## 2018-06-10 DIAGNOSIS — Z9981 Dependence on supplemental oxygen: Secondary | ICD-10-CM

## 2018-06-10 DIAGNOSIS — D649 Anemia, unspecified: Secondary | ICD-10-CM

## 2018-06-10 LAB — CBC WITH DIFFERENTIAL/PLATELET
Abs Immature Granulocytes: 0.04 10*3/uL (ref 0.00–0.07)
Basophils Absolute: 0.1 10*3/uL (ref 0.0–0.1)
Basophils Relative: 1 %
Eosinophils Absolute: 0.2 10*3/uL (ref 0.0–0.5)
Eosinophils Relative: 2 %
HCT: 32.2 % — ABNORMAL LOW (ref 36.0–46.0)
Hemoglobin: 7.9 g/dL — ABNORMAL LOW (ref 12.0–15.0)
Immature Granulocytes: 1 %
Lymphocytes Relative: 16 %
Lymphs Abs: 1.2 10*3/uL (ref 0.7–4.0)
MCH: 20.7 pg — ABNORMAL LOW (ref 26.0–34.0)
MCHC: 24.5 g/dL — ABNORMAL LOW (ref 30.0–36.0)
MCV: 84.3 fL (ref 80.0–100.0)
Monocytes Absolute: 0.7 10*3/uL (ref 0.1–1.0)
Monocytes Relative: 10 %
Neutro Abs: 5.2 10*3/uL (ref 1.7–7.7)
Neutrophils Relative %: 70 %
Platelets: 286 10*3/uL (ref 150–400)
RBC: 3.82 MIL/uL — ABNORMAL LOW (ref 3.87–5.11)
RDW: 32.2 % — ABNORMAL HIGH (ref 11.5–15.5)
WBC: 7.4 10*3/uL (ref 4.0–10.5)
nRBC: 0.5 % — ABNORMAL HIGH (ref 0.0–0.2)

## 2018-06-10 LAB — GLUCOSE, CAPILLARY
Glucose-Capillary: 99 mg/dL (ref 70–99)
Glucose-Capillary: 98 mg/dL (ref 70–99)
Glucose-Capillary: 69 mg/dL — ABNORMAL LOW (ref 70–99)
Glucose-Capillary: 80 mg/dL (ref 70–99)
Glucose-Capillary: 123 mg/dL — ABNORMAL HIGH (ref 70–99)

## 2018-06-10 LAB — COMPREHENSIVE METABOLIC PANEL
ALT: 15 U/L (ref 0–44)
AST: 23 U/L (ref 15–41)
Albumin: 2.6 g/dL — ABNORMAL LOW (ref 3.5–5.0)
Alkaline Phosphatase: 79 U/L (ref 38–126)
Anion gap: 7 (ref 5–15)
BUN: 21 mg/dL — AB (ref 6–20)
CO2: 45 mmol/L — ABNORMAL HIGH (ref 22–32)
Calcium: 8.9 mg/dL (ref 8.9–10.3)
Chloride: 89 mmol/L — ABNORMAL LOW (ref 98–111)
Creatinine, Ser: 0.92 mg/dL (ref 0.44–1.00)
GFR calc Af Amer: >60 mL/min (ref >60)
GFR calc non Af Amer: >60 mL/min (ref >60)
Glucose, Bld: 87 mg/dL (ref 70–99)
Potassium: 3.9 mmol/L (ref 3.5–5.1)
Sodium: 141 mmol/L (ref 135–145)
Total Bilirubin: 1.1 mg/dL (ref 0.3–1.2)
Total Protein: 8.8 g/dL — ABNORMAL HIGH (ref 6.5–8.1)

## 2018-06-10 LAB — AMMONIA: Ammonia: 48 umol/L — ABNORMAL HIGH (ref 9–35)

## 2018-06-10 MED ORDER — POLYETHYLENE GLYCOL 3350 17 G PO PACK
17.0000 g | PACK | Freq: Two times a day (BID) | ORAL | Status: DC
  Administered 2018-06-10 – 2018-06-20 (×22): 17 g via ORAL
  Filled 2018-06-10 (×24): qty 1

## 2018-06-10 MED ORDER — TORSEMIDE 20 MG PO TABS
40.0000 mg | ORAL_TABLET | Freq: Two times a day (BID) | ORAL | Status: DC
  Administered 2018-06-10 – 2018-06-12 (×4): 40 mg via ORAL
  Filled 2018-06-10 (×5): qty 2

## 2018-06-10 MED ORDER — SENNOSIDES-DOCUSATE SODIUM 8.6-50 MG PO TABS
2.0000 | ORAL_TABLET | Freq: Every day | ORAL | Status: DC
  Administered 2018-06-10 – 2018-06-20 (×11): 2 via ORAL
  Filled 2018-06-10 (×11): qty 2

## 2018-06-10 NOTE — Evaluation (Signed)
Physical Therapy Assessment and Plan  Patient Details  Name: Tamara Silva MRN: [id number] Date of Birth: 10-Mar-1961  PT Diagnosis: Abnormality of gait, Coordination disorder, Difficulty walking, Impaired cognition and Muscle weakness Rehab Potential: Fair ELOS: 2-3 weeks    Today's Date: 06/10/2018 PT Individual Time: 1300-1400 PT Individual Time Calculation (min): 60 min    Problem List:  Patient Active Problem List   Diagnosis Date Noted  . Wheezing   . Hyperammonemia ([name])   . Acute on chronic diastolic CHF (congestive heart failure) ([name])   . Supplemental oxygen dependent   . Slow transit constipation   . Encephalopathy 06/09/2018  . Cocaine abuse ([name])   . Diabetes mellitus type 2 in obese ([name])   . Generalized tonic-clonic seizure ([name]) 06/05/2018  . Cerebral embolism with cerebral infarction 06/01/2018  . AKI (acute kidney injury) ([name]) 05/30/2018  . Pressure injury of skin 05/30/2018  . Acute on chronic anemia 05/29/2018  . Chronic respiratory failure with hypoxia, on home O2 therapy ([name]) 01/27/2018  . Iron deficiency anemia 01/27/2018  . Tobacco abuse disorder 10/03/2017  . Dyslipidemia 04/29/2016  . Primary insomnia 04/29/2016  . Hypoxemia 09/08/2015  . Congestive heart failure (CHF) ([name]) 09/08/2015  . Obesity hypoventilation syndrome ([name])   . Microcytic anemia   . Acute encephalopathy   . Uncontrolled type 2 diabetes mellitus with hyperosmolarity without coma ([name])   . Acute on chronic respiratory failure with hypercapnia ([name])   . CAP (community acquired pneumonia)   . OSA (obstructive sleep apnea)   . Acute and chr resp failure, unsp w hypoxia or hypercapnia ([name]) 06/22/2015  . Acute on chronic combined systolic (congestive) and diastolic (congestive) heart failure ([name]) 06/21/2015  . Hypoglycemia 06/21/2015  . Uncontrolled type 2 diabetes mellitus without complication, with long-term current use of insulin ([name])   . Dyslipidemia associated with type 2  diabetes mellitus ([name]) 05/21/2015  . Anemia of chronic kidney failure 05/21/2015  . Hyperkalemia 05/21/2015  . COPD GOLD III  06/10/2014  . Acute respiratory failure with hypoxia ([place]) 03/04/2014  . CKD (chronic kidney disease), stage II 02/07/2014  . COPD exacerbation ([name]) 02/01/2014  . CAD S/P percutaneous coronary angioplasty - prior PCI to LAD; RCA PCI: new Xience Alpine DES 2.75 mm x 15 mm  10/07/2013  . Morbid obesity ([place]) 07/30/2013  . Chronic combined systolic and diastolic heart failure ([name]) 07/10/2013  . Chronic pulmonary embolism ([name]) 02/08/2013  . Bipolar disorder ([name]) 02/08/2013  . Essential hypertension 02/08/2013  . Chronic anticoagulation 11/27/2012  . Edema extremities 10/15/2012  . COPD (chronic obstructive pulmonary disease) ([place]) 10/15/2012  . Pulmonary embolism on right ([name]) 10/15/2012  . Hypertension 10/15/2012  . Insulin dependent diabetes mellitus ([name]) 10/15/2012    Past Medical History:  Past Medical History:  Diagnosis Date  . Asthma   . CAD (coronary artery disease)   . Chronic combined systolic (congestive) and diastolic (congestive) heart failure ([name])   . Chronic iron deficiency anemia   . Diabetes mellitus without complication ([name])   . DOE (dyspnea on exertion)   . Edema of both legs   . Hypertension   . Pulmonary emboli ([name]) 2014   Past Surgical History:  Past Surgical History:  Procedure Laterality Date  . COLONOSCOPY WITH PROPOFOL N/A 08/03/2013   Procedure: COLONOSCOPY WITH PROPOFOL;  Surgeon: Jeryl Columbia, MD;  Location: [facility] END[facility]COPY;  Service: Endoscopy;  Laterality: N/A;  . CORONARY ANGIOPLASTY WITH STENT PLACEMENT     CAD in 2006 x 2  and 2009 2 more- place din REx in [name] and  med  . CORONARY ANGIOPLASTY WITH STENT PLACEMENT  10/07/2013   Xience Alpine DES 2.75  mm x 15  mm  . ESOPHAGOGASTRODUODENOSCOPY (EGD) WITH PROPOFOL N/A 08/03/2013   Procedure: ESOPHAGOGASTRODUODENOSCOPY (EGD) WITH PROPOFOL;  Surgeon: Jeryl Columbia,  MD;  Location: [facility] END[facility];  Service: Endoscopy;  Laterality: N/A;  . LEFT HEART CATHETERIZATION WITH CORONARY ANGIOGRAM N/A 10/07/2013   Procedure: LEFT HEART CATHETERIZATION WITH CORONARY ANGIOGRAM;  Surgeon: Leonie Man, MD;  Location: [name] CATH LAB;  Service: Cardiovascular;  Laterality: N/A;    Assessment & Plan Clinical Impression: Patient is a33 year old female with history of T2DM, COPDwith chronic hypoxic respiratory failure- 4L oxygen dependent, OSA- no CPAP x2 years, anemia, morbid obesity-BMI 50; who was admitted to [facility] on 05/29/2018 with generalized weakness, lethargy, acute on chronic anemia with hemoglobin 5.7 and slurred speech. Family reported issues with confusion for a few days prior to admission. Ammonia levels were elevated 94 and UDS positive for cocaine. She was treated with diuretics for acute on chronic biventricular heart failure, placed on BiPAP and transfused with 2 units packed red blood cells. Cardiology consulted for input and recommended holding aspirin and paracentral till source of bleeding found. CT head done revealing right acute to subacute occipitoparietal infarct therefore she was transferred to [facility] for management. Neurology felt stroke most likely cardioembolic but patient not a good long-term AC candidate due to chronic anemia. To add ASA back once H&H stable. 2D echo with EF of 45%.  She was weaned from BiPAP to CPAP however has been noncompliant with use.On 1/5, she did have progressive hypoxia with episode of unresponsiveness and generalized seizure type activity requiring intubation. She was loaded with Keppra and follow-up CT head was negative for extension of the hemorrhage. She tolerated extubation to BiPAP on 1/6and was started on dysphagia 3 diet. Acute on chronic CHF improving and IV Lasix transitioned to Demadex. Respiratory failure felt to be multifactorial due to OHS, sleep apnea and COPD. Therapy initiated and patient noted to have  issues with delayed processing, poor safety awareness, deficits in mobility and ADL tasks. Patient transferred to CIR on 06/09/2018 .   Patient currently requires max with mobility secondary to muscle weakness, decreased cardiorespiratoy endurance and decreased oxygen support, motor apraxia and decreased coordination, ideational apraxia, decreased initiation, decreased attention, decreased awareness, decreased problem solving, decreased safety awareness, decreased memory and delayed processing and decreased sitting balance, decreased standing balance, decreased postural control and decreased balance strategies.  Prior to hospitalization, patient was min assist with mobility and lived with Significant other([name]) in a House home.  Home access is 2Stairs to enter.  Patient will benefit from skilled PT intervention to maximize safe functional mobility, minimize fall risk and decrease caregiver burden for planned discharge home with 24 hour assist.  Anticipate patient will benefit from follow up [facility] at discharge.  PT - End of Session Activity Tolerance: Tolerates < 10 min activity with changes in vital signs Endurance Deficit: Yes PT Assessment Rehab Potential (ACUTE/IP ONLY): Fair PT Barriers to Discharge: [place] home environment;Decreased caregiver support;Medical stability;Home environment access/layout;Wound Care;Insurance for SNF coverage;Medication compliance;Behavior PT Patient demonstrates impairments in the following area(s): Balance;Behavior;Edema;Endurance;Motor;Nutrition;Perception;Safety;Sensory;Skin Integrity PT Transfers Functional Problem(s): Bed Mobility;Bed to Chair;Car PT Locomotion Functional Problem(s): Ambulation;Wheelchair Mobility PT Plan PT Intensity: Minimum of 1-2 x/day ,45 to 90 minutes PT Frequency: 5 out of 7 days PT Duration Estimated Length of Stay: 2-3 weeks  PT  Treatment/Interventions: Ambulation/gait training;Balance/vestibular training;Cognitive  remediation/compensation;Community reintegration;Discharge planning;Disease management/prevention;DME/adaptive equipment instruction;Functional electrical stimulation;Neuromuscular re-education;Patient/family education;Functional mobility training;Pain management;Psychosocial support;Splinting/orthotics;Therapeutic Activities;UE/LE Strength taining/ROM;Visual/perceptual remediation/compensation;Wheelchair propulsion/positioning;UE/LE Coordination activities;Therapeutic Exercise;Stair training;Skin care/wound management PT Transfers Anticipated Outcome(s): Min assist with LRAD  PT Locomotion Anticipated Outcome(s): Min assist ambulation for short household distances.  PT Recommendation Recommendations for Other Services: Neuropsych consult Follow Up Recommendations: Home health PT Patient destination: Home Equipment Recommended: To be determined  Skilled Therapeutic Intervention Pt received supine in bed and agreeable to PT. Supine>sit transfer with min assist and max multimodal cues and encouragement to participate in therapy. PT instructed patient in PT Evaluation and initiated treatment intervention; see below for results. PT educated patient in [place], rehab potential, rehab goals, and discharge recommendations. Pt resistant to all mobility and continually calling to God when SOB. Pt noted to be extremely SOB when initially returned to supine, and demonstrated significant distress until assisted to Long sitting in bed, no distress on second attempt to return to supine. RN aware of pt's Dyspnea. Pt left in bed with all 4 rails up per request and husband present.     PT Evaluation Precautions/Restrictions Precautions Precautions: Fall Precaution Comments: Per chart, 4L 02 at baseline Restrictions Weight Bearing Restrictions: No General   Vital SignsTherapy Vitals Temp: 97.6 F (36.4 C) Temp Source: Oral Pulse Rate: 87 Resp: 20 BP: (!) 152/86 Patient Position (if appropriate): Lying Oxygen  Therapy SpO2: 99 % O2 Device: Nasal Cannula O2 Flow Rate (L/min): 4 L/min Pain Pain Assessment Pain Scale: Faces Pain Score: 0-No pain Faces Pain Scale: Hurts a little bit Home Living/Prior Functioning Home Living Available Help at Discharge: Family;Available 24 hours/day Type of Home: House Home Access: Stairs to enter [employer] of Steps: 2 Home Layout: One level Bathroom Shower/Tub: (Per spouse, Pt does not shower) Bathroom Toilet: Handicapped height(She uses an elevated BSC in the bedroom, per spouse) Bathroom Accessibility: Yes  Lives With: Significant other([name]) Prior Function Level of Independence: Needs assistance with ADLs;Needs assistance with tranfers Vocation: On disability Comments: per husband, used SPC PTA and able to ambulate only short distances in home on 4 L/min O2.  Vision/Perception  Perception Perception: Impaired Praxis Praxis: Impaired Praxis Impairment Details: Initiation;Ideomotor;Perseveration  Cognition Overall Cognitive Status: Impaired/Different from baseline Arousal/Alertness: Lethargic Orientation Level: Oriented X4 Attention: Sustained Focused Attention: Appears intact Sustained Attention: Impaired Sustained Attention Impairment: Verbal basic;Functional basic Memory: Impaired Memory Impairment: Decreased recall of new information;Storage deficit;Retrieval deficit Awareness: Impaired Awareness Impairment: Emergent impairment;Intellectual impairment Problem Solving: Impaired Problem Solving Impairment: Verbal basic;Functional basic Behaviors: Perseveration;Restless;Impulsive;Lability Safety/Judgment: Impaired Comments: emotionaly distressed, confused  Sensation Sensation Additional Comments: unable to assess due to cognitive state Coordination Gross Motor Movements are Fluid and Coordinated: No Fine Motor Movements are Fluid and Coordinated: No Coordination and Movement Description: mild RUE dismetria  Motor   Motor Motor: Other (comment) Motor - Skilled Clinical Observations: Generalized weakness   Mobility Bed Mobility Bed Mobility: Rolling Right;Rolling Left;Sit to Supine;Supine to Sit Rolling Right: Moderate Assistance - Patient 50-74% Rolling Left: Moderate Assistance - Patient 50-74% Supine to Sit: Minimal Assistance - Patient > 75% Sit to Supine: Moderate Assistance - Patient 50-74% Transfers Transfers: Sit to Stand;Stand Pivot Transfers Sit to Stand: Minimal Assistance - Patient > 75% Stand Pivot Transfers: Total Assistance - Patient < 25%(to prevent fall from R knee buckle. unable to attempt 2nd time due to lack of initiation ) Transfer (Assistive device): Rolling walker Locomotion  Gait Ambulation: No Gait Gait: No Stairs / Additional Locomotion Stairs: No Wheelchair  Mobility Wheelchair Mobility: No  Trunk/Postural Assessment  Cervical Assessment Cervical Assessment: Exceptions to [facility] Thoracic Assessment Thoracic Assessment: Exceptions to WFL(rounded shoulders) Lumbar Assessment Lumbar Assessment: Exceptions to [facility] Postural Control Postural Control: Within Functional Limits(sitting EOB)  Balance Balance Balance Assessed: Yes Static Sitting Balance Static Sitting - Level of Assistance: 5: Stand by assistance Dynamic Sitting Balance Dynamic Sitting - Level of Assistance: 5: Stand by assistance Static Standing Balance Static Standing - Level of Assistance: 4: Min assist Dynamic Standing Balance Dynamic Standing - Level of Assistance: 1: +1 Total assist(to prevent fall when attempting stand pivot transfer. ) Extremity Assessment      RLE Assessment RLE Assessment: Exceptions to [facility] General Strength Comments: grossly 4/5 with fuctional movement LLE Assessment LLE Assessment: Exceptions to [facility] General Strength Comments: grossly 4/5 wiht functional movement    Refer to Care Plan for Long Term Goals  Recommendations for other services: Neuropsych  Discharge  Criteria: Patient will be discharged from PT if patient refuses treatment 3 consecutive times without medical reason, if treatment goals not met, if there is a change in medical status, if patient makes no progress towards goals or if patient is discharged from hospital.  The above assessment, treatment plan, treatment alternatives and goals were discussed and mutually agreed upon: by patient and by family  Lorie Phenix 06/10/2018, 6:17 PM

## 2018-06-10 NOTE — Evaluation (Signed)
Occupational Therapy Assessment and Plan  Patient Details  Name: Tamara Silva MRN: [id number] Date of Birth: Nov 28, 1960  OT Diagnosis: altered mental status, apraxia, cognitive deficits and muscle weakness (generalized) Rehab Potential: Rehab Potential (ACUTE ONLY): Fair ELOS: 3 weeks   Today's Date: 06/10/2018 OT Individual Time: 9357-0177 OT Individual Time Calculation (min): 79 min     Problem List:  Patient Active Problem List   Diagnosis Date Noted  . Wheezing   . Hyperammonemia ([place])   . Acute on chronic diastolic CHF (congestive heart failure) ([place])   . Supplemental oxygen dependent   . Slow transit constipation   . Encephalopathy 06/09/2018  . Cocaine abuse ([place])   . Diabetes mellitus type 2 in obese ([place])   . Generalized tonic-clonic seizure ([place]) 06/05/2018  . Cerebral embolism with cerebral infarction 06/01/2018  . AKI (acute kidney injury) ([place]) 05/30/2018  . Pressure injury of skin 05/30/2018  . Acute on chronic anemia 05/29/2018  . Chronic respiratory failure with hypoxia, on home O2 therapy ([place]) 01/27/2018  . Iron deficiency anemia 01/27/2018  . Tobacco abuse disorder 10/03/2017  . Dyslipidemia 04/29/2016  . Primary insomnia 04/29/2016  . Hypoxemia 09/08/2015  . Congestive heart failure (CHF) ([place]) 09/08/2015  . Obesity hypoventilation syndrome ([place])   . Microcytic anemia   . Acute encephalopathy   . Uncontrolled type 2 diabetes mellitus with hyperosmolarity without coma ([place])   . Acute on chronic respiratory failure with hypercapnia ([place])   . CAP (community acquired pneumonia)   . OSA (obstructive sleep apnea)   . Acute and chr resp failure, unsp w hypoxia or hypercapnia ([place]) 06/22/2015  . Acute on chronic combined systolic (congestive) and diastolic (congestive) heart failure ([place]) 06/21/2015  . Hypoglycemia 06/21/2015  . Uncontrolled type 2 diabetes mellitus without complication, with long-term current use of insulin ([place])   . Dyslipidemia  associated with type 2 diabetes mellitus ([place]) 05/21/2015  . Anemia of chronic kidney failure 05/21/2015  . Hyperkalemia 05/21/2015  . COPD GOLD III  06/10/2014  . Acute respiratory failure with hypoxia ([place]) 03/04/2014  . CKD (chronic kidney disease), stage II 02/07/2014  . COPD exacerbation ([place]) 02/01/2014  . CAD S/P percutaneous coronary angioplasty - prior PCI to LAD; RCA PCI: new Xience Alpine DES 2.75 mm x 15 mm  10/07/2013  . Morbid obesity ([place]) 07/30/2013  . Chronic combined systolic and diastolic heart failure ([place]) 07/10/2013  . Chronic pulmonary embolism ([place]) 02/08/2013  . Bipolar disorder ([place]) 02/08/2013  . Essential hypertension 02/08/2013  . Chronic anticoagulation 11/27/2012  . Edema extremities 10/15/2012  . COPD (chronic obstructive pulmonary disease) ([place]) 10/15/2012  . Pulmonary embolism on right ([place]) 10/15/2012  . Hypertension 10/15/2012  . Insulin dependent diabetes mellitus ([place]) 10/15/2012    Past Medical History:  Past Medical History:  Diagnosis Date  . Asthma   . CAD (coronary artery disease)   . Chronic combined systolic (congestive) and diastolic (congestive) heart failure ([place])   . Chronic iron deficiency anemia   . Diabetes mellitus without complication ([place])   . DOE (dyspnea on exertion)   . Edema of both legs   . Hypertension   . Pulmonary emboli ([place]) 2014   Past Surgical History:  Past Surgical History:  Procedure Laterality Date  . COLONOSCOPY WITH PROPOFOL N/A 08/03/2013   Procedure: COLONOSCOPY WITH PROPOFOL;  Surgeon: Jeryl Columbia, MD;  Location: [facility] ENDOSCOPY;  Service: Endoscopy;  Laterality: N/A;  . CORONARY ANGIOPLASTY WITH STENT PLACEMENT     CAD in 2006  x 2 and 2009 2 more- place din REx in [employer] [facility] [place] [facility]  . CORONARY ANGIOPLASTY WITH STENT PLACEMENT  10/07/2013   Xience Alpine DES 2.75  mm x 15  mm  . ESOPHAGOGASTRODUODENOSCOPY (EGD) WITH PROPOFOL N/A 08/03/2013   Procedure: ESOPHAGOGASTRODUODENOSCOPY (EGD) WITH PROPOFOL;   Surgeon: Jeryl Columbia, MD;  Location: [facility] ENDOS[facility];  Service: Endoscopy;  Laterality: N/A;  . LEFT HEART CATHETERIZATION WITH CORONARY ANGIOGRAM N/A 10/07/2013   Procedure: LEFT HEART CATHETERIZATION WITH CORONARY ANGIOGRAM;  Surgeon: Leonie Man, MD;  Location: [facility] CATH LAB;  Service: Cardiovascular;  Laterality: N/A;    Assessment & Plan Clinical Impression: Tamara Silva is a 58 year old female with history of T2DM, COPDwith chronic hypoxic respiratory failure- 4L oxygen dependent, OSA- no CPAP x2 years, anemia, morbid obesity-BMI 50; who was admitted to [facility] on 05/29/2018 with generalized weakness, lethargy, acute on chronic anemia with hemoglobin 5.7 and slurred speech. Family reported issues with confusion for a few days prior to admission. Ammonia levels were elevated 94 and UDS positive for cocaine. She was treated with diuretics for acute on chronic biventricular heart failure, placed on BiPAP and transfused with 2 units packed red blood cells. Cardiology consulted for input and recommended holding aspirin and paracentral till source of bleeding found. CT head done revealing right acute to subacute occipitoparietal infarct therefore she was transferred to [facility] for management. Neurology felt stroke most likely cardioembolic but patient not a good long-term AC candidate due to chronic anemia. To add ASA back once H&H stable. 2D echo with EF of 45%.  She was weaned from BiPAP to CPAP however has been noncompliant with use.On 1/5, she did have progressive hypoxia with episode of unresponsiveness and generalized seizure type activity requiring intubation. She was loaded with Keppra and follow-up CT head was negative for extension of the hemorrhage. She tolerated extubation to BiPAP on 1/6and was started on dysphagia 3 diet. Acute on chronic CHF improving and IV Lasix transitioned to Demadex. Respiratory failure felt to be multifactorial due to OHS, sleep apnea and COPD. Therapy  initiated and patient noted to have issues with delayed processing, poor safety awareness, deficits in mobility and ADL tasks. CIR recommended due to functional decline  Patient currently requires total of 2 helpers with basic self-care skills secondary to muscle weakness, decreased cardiorespiratoy endurance, decreased initiation, decreased attention, decreased awareness, decreased problem solving, decreased safety awareness, decreased memory and delayed processing. Prior to hospitalization, patient could complete BADLs with 1 caregiver.  Patient will benefit from skilled intervention to decrease level of assist with basic self-care skills prior to discharge home with caregiver.  Anticipate patient will require 24 hour supervision and and Total care for self care and follow up home health.  OT Assessment Rehab Potential (ACUTE ONLY): Fair OT Barriers to Discharge: Medical stability;Incontinence;Wound Care;Lack of/limited family support;Weight;Behavior OT Patient demonstrates impairments in the following area(s): Balance;Behavior;Skin Integrity;Cognition;Endurance;Motor;Safety OT Basic ADL's Functional Problem(s): Eating;Grooming;Bathing;Dressing;Toileting OT Advanced ADL's Functional Problem(s): Simple Meal Preparation;Full Meal Preparation OT Transfers Functional Problem(s): Toilet OT Additional Impairment(s): None OT Plan OT Intensity: Minimum of 1-2 x/day, 45 to 90 minutes OT Duration/Estimated Length of Stay: 3 weeks OT Treatment/Interventions: Balance/vestibular training;Disease mangement/prevention;Self Care/advanced ADL retraining;Therapeutic Exercise;Wheelchair propulsion/positioning;Cognitive remediation/compensation;DME/adaptive equipment instruction;Pain management;Skin care/wound managment;UE/LE Strength taining/ROM;UE/LE Coordination activities;Patient/family education;Community reintegration;Discharge planning;Functional mobility training;Psychosocial support;Therapeutic  Activities;Neuromuscular re-education OT Self Feeding Anticipated Outcome(s): No goal  OT Basic Self-Care Anticipated Outcome(s): Mod A sit<stand in prep for self care + pt will be able to stand  for 3 minutes for caregiver to complete LB self care OT Toileting Anticipated Outcome(s): No goal- Total care PTA OT Bathroom Transfers Anticipated Outcome(s): Mod A BSC transfer OT Recommendation Recommendations for Other Services: Neuropsych consult Patient destination: Home Follow Up Recommendations: Home health OT;24 hour supervision/assistance Equipment Recommended: To be determined   Skilled Therapeutic Intervention Skilled OT session completed with focus on initial evaluation, education on OT role/POC, and establishment of patient-centered goals. Eval limited due to emotional lability, confusion, and attention deficits. Pt at times visibly encouraged, reporting "I have to do this myself- let me do it" and then other times stated "Listen- I'm dying. I can't." while very tearful. Pt initially greeted on bedpan, lethargic, and with delayed processing when asked questions or given instruction. Max A initially to roll to Lt while 2nd helper removed bedpan and completed perihygiene. Pt with urinary incontinence once bedpan removed, and required bed change with 2 assist. During bathing/dressing tasks bedlevel, pt able to roll to Rt>Lt with Min A, and then when she refused to assist, required Max A again. Pt required step by step cues for sequencing of UB bathing. Able to lift each LE against gravity for OT to wash legs, don Teds, and gripper socks. Also able to raise arms overhead bilaterally for OT to wash underarms. When attempting to get pt EOB (slowly), she began crying, stating "you all move too fast." 02 stable throughout session 98-100% on 4L. She was boosted up frequently in bed (ranging from Mod A of 2 or Total A of 2). Notified RN to get bed mats placed due to high fall risk with pt sliding R LE out of  Rt side of bed. Pt left with 4 bedrails up, bed set at lowest position, call bell, and spouse present.      Throughout tx, pt frequently wheezing and calling out to God. Per RN, this is normal since admission.  OT Evaluation Precautions/Restrictions  Precautions Precautions: Fall Precaution Comments: Per chart, 4L 02 at baseline Restrictions Weight Bearing Restrictions: No Pain: Pt denied pain, though often cried out and grimaced. Unable to locate pain when asked to point to general location  Pain Assessment Pain Score: 0-No pain Home Living/Prior [place] expects to be discharged to:: Private residence Living Arrangements: Spouse/significant other, Children Available Help at Discharge: Family, Available 24 hours/day Type of Home: House Home Access: Stairs to enter [employer] of Steps: 2 Home Layout: One level Bathroom Shower/Tub: (Per spouse, Pt does not shower) Bathroom Toilet: Handicapped height(She uses an elevated BSC in the bedroom, per spouse) Bathroom Accessibility: Yes Additional Comments: Per [name], he provided Max A for self care and Mod A for short distance ambulatory transfers using [facility] PTA  Lives With: Significant other([name]) IADL History Homemaking Responsibilities: No Occupation: Other (comment) Type of Occupation: Per pt, she used to work in [employer]  Leisure and Hobbies: Spending time with daughter Lenward Chancellor  IADL Comments: Spouse was responsible for ADLs/IADLs Prior Function Level of Independence: Needs assistance with ADLs, Needs assistance with tranfers ADL ADL Eating: Not assessed Grooming: Not assessed Upper Body Bathing: Maximal assistance Where Assessed-Upper Body Bathing: Bed level Lower Body Bathing: Other (comment)(2 helpers) Where Assessed-Lower Body Bathing: Bed level Upper Body Dressing: Dependent Where Assessed-Upper Body Dressing: Bed level Lower Body Dressing: Dependent Where Assessed-Lower  Body Dressing: Bed level Toileting: Not assessed Toilet Transfer: Not assessed Walk-In Shower Transfer: Not assessed Vision Baseline Vision/History: Wears glasses(Per pt/spouse. Advised him to bring in pts glasses) Wears Glasses: At all  times Patient Visual Report: Other (comment)(Pt unable to provide consistent visual report) Vision Assessment?: (Too difficult to assess due to attention deficits, confusion, and emotional lability) Praxis Praxis: Impaired Praxis Impairment Details: Initiation;Motor planning;Perseveration;Ideomotor Praxis-Other Comments: Delayed processing Cognition Overall Cognitive Status: Impaired/Different from baseline Arousal/Alertness: Lethargic Orientation Level: Person;Place(Able to state he had a seizure, but unable to state she had CVA) Year: 2020(given options of 2) Month: January(given options of 2) Day of Week: Correct(given options of 2) Memory: Impaired Immediate Memory Recall: (0/3) Memory Recall: (0/3) Attention: Sustained Sustained Attention: Impaired Awareness: Impaired Problem Solving: Impaired Behaviors: Perseveration;Restless;Impulsive;Lability Safety/Judgment: Impaired Comments: Emotional lability and confusion limited OT eval Sensation Coordination Gross Motor Movements are Fluid and Coordinated: No Fine Motor Movements are Fluid and Coordinated: No Motor  Motor Motor: Other (comment) Motor - Skilled Clinical Observations: Generalized weakness  Mobility    Min-Max A for bed mobility during self care  Balance Balance Balance Assessed: No Extremity/Trunk Assessment RUE Assessment General Strength Comments: Difficult to assess. Able to boost herself up in bed when hand was placed on headboard. Also able to pull herself using limb to roll in bed. Pt required max encouragement to participate during tx  LUE Assessment General Strength Comments: Difficult to assess. Able to boost herself up in bed when hand was placed on headboard. Also  able to pull herself using limb to roll in bed. Pt required max encouragement to participate during tx      Refer to Care Plan for Long Term Goals  Recommendations for other services: Neuropsych   Discharge Criteria: Patient will be discharged from OT if patient refuses treatment 3 consecutive times without medical reason, if treatment goals not met, if there is a change in medical status, if patient makes no progress towards goals or if patient is discharged from hospital.  The above assessment, treatment plan, treatment alternatives and goals were discussed and mutually agreed upon: by patient and by family  Skeet Simmer 06/10/2018, 5:32 PM

## 2018-06-10 NOTE — Evaluation (Addendum)
Speech Language Pathology Assessment and Plan  Patient Details  Name: Tamara Silva MRN: [id number] Date of Birth: 12/09/1960  SLP Diagnosis: Cognitive Impairments;Dysphagia  Rehab Potential: Good ELOS: 16-19 days     Today's Date: 06/10/2018 SLP Individual Time: 0802-0900 SLP Individual Time Calculation (min): 58 min   Problem List:  Patient Active Problem List   Diagnosis Date Noted  . Wheezing   . Hyperammonemia ([place])   . Acute on chronic diastolic CHF (congestive heart failure) ([place])   . Supplemental oxygen dependent   . Slow transit constipation   . Encephalopathy 06/09/2018  . Cocaine abuse ([place])   . Diabetes mellitus type 2 in obese ([place])   . Generalized tonic-clonic seizure ([place]) 06/05/2018  . Cerebral embolism with cerebral infarction 06/01/2018  . AKI (acute kidney injury) ([place]) 05/30/2018  . Pressure injury of skin 05/30/2018  . Acute on chronic anemia 05/29/2018  . Chronic respiratory failure with hypoxia, on home O2 therapy ([place]) 01/27/2018  . Iron deficiency anemia 01/27/2018  . Tobacco abuse disorder 10/03/2017  . Dyslipidemia 04/29/2016  . Primary insomnia 04/29/2016  . Hypoxemia 09/08/2015  . Congestive heart failure (CHF) ([place]) 09/08/2015  . Obesity hypoventilation syndrome ([place])   . Microcytic anemia   . Acute encephalopathy   . Uncontrolled type 2 diabetes mellitus with hyperosmolarity without coma ([place])   . Acute on chronic respiratory failure with hypercapnia ([place])   . CAP (community acquired pneumonia)   . OSA (obstructive sleep apnea)   . Acute and chr resp failure, unsp w hypoxia or hypercapnia ([place]) 06/22/2015  . Acute on chronic combined systolic (congestive) and diastolic (congestive) heart failure ([place]) 06/21/2015  . Hypoglycemia 06/21/2015  . Uncontrolled type 2 diabetes mellitus without complication, with long-term current use of insulin ([place])   . Dyslipidemia associated with type 2 diabetes mellitus ([place]) 05/21/2015  . Anemia of  chronic kidney failure 05/21/2015  . Hyperkalemia 05/21/2015  . COPD GOLD III  06/10/2014  . Acute respiratory failure with hypoxia ([place]) 03/04/2014  . CKD (chronic kidney disease), stage II 02/07/2014  . COPD exacerbation ([place]) 02/01/2014  . CAD S/P percutaneous coronary angioplasty - prior PCI to LAD; RCA PCI: new Xience Alpine DES 2.75 mm x 15 mm  10/07/2013  . Morbid obesity ([place]) 07/30/2013  . Chronic combined systolic and diastolic heart failure ([place]) 07/10/2013  . Chronic pulmonary embolism ([place]) 02/08/2013  . Bipolar disorder ([place]) 02/08/2013  . Essential hypertension 02/08/2013  . Chronic anticoagulation 11/27/2012  . Edema extremities 10/15/2012  . COPD (chronic obstructive pulmonary disease) ([place]) 10/15/2012  . Pulmonary embolism on right ([place]) 10/15/2012  . Hypertension 10/15/2012  . Insulin dependent diabetes mellitus ([place]) 10/15/2012   Past Medical History:  Past Medical History:  Diagnosis Date  . Asthma   . CAD (coronary artery disease)   . Chronic combined systolic (congestive) and diastolic (congestive) heart failure ([place])   . Chronic iron deficiency anemia   . Diabetes mellitus without complication ([place])   . DOE (dyspnea on exertion)   . Edema of both legs   . Hypertension   . Pulmonary emboli ([place]) 2014   Past Surgical History:  Past Surgical History:  Procedure Laterality Date  . COLONOSCOPY WITH PROPOFOL N/A 08/03/2013   Procedure: COLONOSCOPY WITH PROPOFOL;  Surgeon: Jeryl Columbia, MD;  Location: [facility] ENDOSCOPY;  Service: Endoscopy;  Laterality: N/A;  . CORONARY ANGIOPLASTY WITH STENT PLACEMENT     CAD in 2006 x 2 and 2009 2 more- place din REx in [place]  and [facility]  . CORONARY ANGIOPLASTY WITH STENT PLACEMENT  10/07/2013   Xience Alpine DES 2.75  mm x 15  mm  . ESOPHAGOGASTRODUODENOSCOPY (EGD) WITH PROPOFOL N/A 08/03/2013   Procedure: ESOPHAGOGASTRODUODENOSCOPY (EGD) WITH PROPOFOL;  Surgeon: Jeryl Columbia, MD;  Location: [facility] ENDOSCOPY;  Service: Endoscopy;   Laterality: N/A;  . LEFT HEART CATHETERIZATION WITH CORONARY ANGIOGRAM N/A 10/07/2013   Procedure: LEFT HEART CATHETERIZATION WITH CORONARY ANGIOGRAM;  Surgeon: Leonie Man, MD;  Location: [facility] CATH LAB;  Service: Cardiovascular;  Laterality: N/A;    Assessment / Plan / Recommendation Clinical Impression Tamara Silva is a 58 year old female with history of T2DM, COPDwith chronic hypoxic respiratory failure- 4L oxygen dependent, OSA- no CPAP x2 years, anemia, morbid obesity-BMI 50; who was [facility]2018 with generalized weakness, lethargy, acute on chronic anemia with hemoglobin 5.7 and slurred speech. Family reported issues with confusion for a few days prior to admission. Ammonia levels were elevated 94 and UDS positive for cocaine. She was treated with diuretics for acute on chronic biventricular heart failure, placed on BiPAP and transfused with 2 units packed red blood cells. Cardiology consulted for input and recommended holding aspirin and paracentral till source of bleeding found. CT head done revealing right acute to subacute occipitoparietal infarct therefore she was transferred to [facility] for management. Neurology felt stroke most likely cardioembolic but patient not a good long-term AC candidate due to chronic anemia. To add ASA back once H&H stable. 2D echo with EF of 45%.  She was weaned from BiPAP to CPAP however has been noncompliant with use.On 1/5, she did have progressive hypoxia with episode of unresponsiveness and generalized seizure type activity requiring intubation. She was loaded with Keppra and follow-up CT head was negative for extension of the hemorrhage. She tolerated extubation to BiPAP on 1/6and was started on dysphagia 3 diet. Acute on chronic CHF improving and IV Lasix transitioned to Demadex. Respiratory failure felt to be multifactorial due to OHS, sleep apnea and COPD. Therapy initiated and patient noted to have issues with delayed  processing, poor safety awareness, deficits in mobility and ADL tasks. CIR recommended due to functional decline.  Pt presents with severe cognitive linguistic impairments, deficits include immediate/short term recall, basic problem solving, error awareness, and sustained attention, largely due to delayed processing, which was further supported by formal cognitive linguistic assessment utilizing MOCA version 7.1 scoring 7 out 30 ( n=> 26, with additional point given for education.) Pt's score was significantly lowered then mini mental administered on acute, however pt's performance during present evaluation appeared to be impacted my emotional distress (requesting several times increase O2, respirtory therapist provided, although O2 stats were 91% saturation with nasal canal already in place, piror to supplement O2 adminstration), fatigue closing eyes often and max-mod verbal encouragement to participate in [place] services. Pt demonstrated orientation x4, following 1 step commands and naming common items, appeared to be only impacted by sustained attention and delayed processing. Pt presents with [facility] speech. Pt presents with mild impairments in swallow function due to cognitive deficits, however only limited to dys 3 and thin trials due to pt's distress and refusal of regular trials. SLP recommends dys 3 and thin diet, with full supervision due to cognitive deficits. ST services should continue to assess cognitive linguistic function, since performance on evaluation date was limited due to items listed above. Pt would benefit from skilled ST services in order to maximize functional independence and reduce burden of care, likely  requiring supervision at discharge with continued skilled ST services.   Skilled Therapeutic Interventions          Skilled ST services focused on cognitive skills. SLP facilitated administration of cognitive linguistic formal assessment and provided education of results. SLP and pt  colaborated to set goals for cognitive linguistic needs during length of stay. All questions answered to satisfaction.  Pt was left in room with call bell within reach and bed/chair alarm set. SLP reccomends to continue skilled services.   SLP Assessment  Patient will need skilled Speech Lanaguage Pathology Services during CIR admission    Recommendations  SLP Diet Recommendations: Thin Liquid Administration via: Straw;Cup Medication Administration: Whole meds with puree Supervision: Patient able to self feed;Full supervision/cueing for compensatory strategies(due to cognition) Compensations: Slow rate;Small sips/bites;Minimize environmental distractions Postural Changes and/or Swallow Maneuvers: Seated upright 90 degrees;Upright 30-60 min after meal Oral Care Recommendations: Oral care BID Recommendations for Other Services: Neuropsych consult Patient destination: Home Follow up Recommendations: Home Health SLP;Outpatient SLP;24 hour supervision/assistance Equipment Recommended: None recommended by SLP    SLP Frequency 3 to 5 out of 7 days   SLP Duration  SLP Intensity  SLP Treatment/Interventions 16-19 days   Minumum of 1-2 x/day, 30 to 90 minutes  Cognitive remediation/compensation;Cueing hierarchy;Dysphagia/aspiration precaution training;Functional tasks    Pain Pain Assessment Pain Score: 0-No pain  Prior Functioning Cognitive/Linguistic Baseline: Within functional limits(however family not awake to confirm, boyfriend in room asleep) Type of Home: House  Lives With: Significant other Available Help at Discharge: Family;Available 24 hours/day Vocation: On disability  Short Term Goals: Week 1: SLP Short Term Goal 1 (Week 1): Pt will demonstrate sustained attention to functional task in 2 minute intervals with mod A verbal cues to redirect. SLP Short Term Goal 2 (Week 1): Pt will demonstrate basic and functional problem solving skills in functional tasks with max A verbal  cues.  SLP Short Term Goal 3 (Week 1): Pt will self-monitor functional errors during functional tasks with max A verbal cues.  SLP Short Term Goal 4 (Week 1): Pt will demonstrate recall of daily information with max A verbal cues to utilizie external aid.  SLP Short Term Goal 5 (Week 1): Pt will consume trials of regular textured foods with appropriate oral clearance and min overt s/s aspiration over 3 sessions piror to solid upgrade.  Refer to Care Plan for Long Term Goals  Recommendations for other services: None   Discharge Criteria: Patient will be discharged from SLP if patient refuses treatment 3 consecutive times without medical reason, if treatment goals not met, if there is a change in medical status, if patient makes no progress towards goals or if patient is discharged from hospital.  The above assessment, treatment plan, treatment alternatives and goals were discussed and mutually agreed upon: by patient  Kaceton Vieau  [facility] 06/10/2018, 4:20 PM

## 2018-06-10 NOTE — Progress Notes (Signed)
Pt placed on bipap at previous settings. tol well

## 2018-06-10 NOTE — Progress Notes (Addendum)
[place] PHYSICAL MEDICINE & REHABILITATION PROGRESS NOTE  Subjective/Complaints: Patient seen laying in the bed this morning.  She states she slept well overnight.  She notes she is constipated.  ROS: + Constipation.  Denies chest pain, shortness of breath, nausea, vomiting, diarrhea.  Objective: Vital Signs: Blood pressure 133/78, pulse 79, temperature 97.8 F (36.6 C), temperature source Oral, resp. rate (!) 24, height 5' 7.5" (1.715 m), weight (!) 140.3 kg, last menstrual period 07/28/2013, SpO2 98 %. Dg Chest Port 1v Same Day  Result Date: 06/09/2018 CLINICAL DATA:  Wheezing short of breath EXAM: PORTABLE CHEST 1 VIEW COMPARISON:  06/04/2018 FINDINGS: Endotracheal tube removed Cardiac enlargement with mild vascular congestion. Negative for heart failure or edema. Negative for effusion Chronic pleural thickening and scarring left upper lobe. IMPRESSION: Cardiac enlargement with vascular congestion. Electronically Signed   By: Franchot Gallo M.D.   On: 06/09/2018 16:36   Recent Labs    06/08/18 0322 06/10/18 0544  WBC 8.1 7.4  HGB 7.9* 7.9*  HCT 31.0* 32.2*  PLT 349 286   Recent Labs    06/08/18 0322 06/10/18 0544  NA 139 141  K 3.7 3.9  CL 92* 89*  CO2 41* 45*  GLUCOSE 195* 87  BUN 22* 21*  CREATININE 1.00 0.92  CALCIUM 8.2* 8.9    Physical Exam: BP 133/78 (BP Location: Right Arm)   Pulse 79   Temp 97.8 F (36.6 C) (Oral)   Resp (!) 24   Ht 5' 7.5" (1.715 m)   Wt (!) 140.3 kg   LMP 07/28/2013 Comment: perimenopausal  SpO2 98%   BMI 47.73 kg/m  Constitutional: NAD.  Morbidly obese. HENT: Normocephalic.  Atraumatic. Eyes:EOMI.  No discharge. Respiratory: She haswheezes Gastrointestinal: Bowel sounds normal.  Distended. Musculoskeletal: Edemapresent in bilateral lower extremities.  Neurological: Alert and oriented x3.   Dysarthria Motor: 4-/5 grossly throughout.  Skin: Skin iswarm. Skin tears under breasts and dry flaky skin on abdomen and BLE.   Not examined today. Healed lesions on extremities. Psychiatric:  Pleasant and cooperative  Assessment/Plan: 1. Functional deficits secondary to debility which require 3+ hours per day of interdisciplinary therapy in a comprehensive inpatient rehab setting.  Physiatrist is providing close team supervision and 24 hour management of active medical problems listed below.  Physiatrist and rehab team continue to assess barriers to discharge/monitor patient progress toward functional and medical goals  Care Tool:  Bathing              Bathing assist       Upper Body Dressing/Undressing Upper body dressing        Upper body assist      Lower Body Dressing/Undressing Lower body dressing            Lower body assist       Toileting Toileting Toileting Activity did not occur (Clothing management and hygiene only): N/A (no void or bm)  Toileting assist Assist for toileting: 2 Helpers     Transfers Chair/bed transfer  Transfers assist           Locomotion Ambulation   Ambulation assist              Walk 10 feet activity   Assist           Walk 50 feet activity   Assist           Walk 150 feet activity   Assist           Walk 10 feet  on uneven surface  activity   Assist           Wheelchair     Assist               Wheelchair 50 feet with 2 turns activity    Assist            Wheelchair 150 feet activity     Assist            Medical Problem List and Plan: 1.Functional deficitssecondary to metabolic encephalopathy, right occipital-parietal infact and debility after multiple medical issues  Begin CIR  Notes reviewed- multiple medical problems, images reviewed- right brain infarct, labs reviewed 2. DVT Prophylaxis/Anticoagulation: Pharmaceutical:Lovenox 3. Pain Management:Tylenol as needed 4. Mood:LCSW to follow for evaluation and support 5. Neuropsych: This patientis not  fullycapable of making decisions on hisown behalf. 6. Skin/Wound Care:Routine pressure relief measures 7. Fluids/Electrolytes/Nutrition:Monitor I/O.   BMP within acceptable range except for elevated CO2 on 1/11  Labs ordered for Monday 8. COPD with chronic hypoxic respiratory failure: oxygen dependent--uses 4 L at baseline. 9. 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  Added nebs prn. 10. Acute on chronic CHF: Low-salt diet. On Coreg, statin. May need LFTs checked every 2 months.  Chest x-ray reviewed, suggesting fluid  Torsemide increased to 40 twice daily  Filed Weights   06/09/18 1425 06/10/18 0319  Weight: (!) 142 kg (!) 140.3 kg  11. Acute on chronic anemia: Improved post transfusion. Now back on ASA--monitor with serial checks.   Hemoglobin 7.9 on 1/11  Labs ordered for Monday 12.Elevated ammonia levels: Resolved with lactuloseenemas.   Level of 48 on 1/11 13.Multifactorial encephalopathy: Has resolved--does not want cocaine abuse discussed with her daughter 32. T2DM: Monitor BS ac/hs. Continue Levemir with SSI. Intake variable --will continue to titrate as indicated.  Monitor with increased mobility 15.  Morbid obesity  Encourage weight loss 16.  Constipation  Bowel regimen increased on 1/11  LOS: 1 days A FACE TO FACE EVALUATION WAS PERFORMED  Tamara Silva Tamara Silva 06/10/2018, 7:50 AM

## 2018-06-10 NOTE — Progress Notes (Signed)
Hypoglycemic Event  CBG: 69   Treatment: 8 oz juice/soda @ 1620  Symptoms: None  Follow-up CBG: Time: 1646  CBG Result: 80  Possible Reasons for Event: Inadequate meal intake  Comments/MD notified: Patients BS after having a sprite was back up to 80.     Tamara Silva

## 2018-06-11 ENCOUNTER — Inpatient Hospital Stay (HOSPITAL_COMMUNITY): Payer: Self-pay | Admitting: Occupational Therapy

## 2018-06-11 DIAGNOSIS — R0989 Other specified symptoms and signs involving the circulatory and respiratory systems: Secondary | ICD-10-CM

## 2018-06-11 LAB — GLUCOSE, CAPILLARY
Glucose-Capillary: 94 mg/dL (ref 70–99)
Glucose-Capillary: 118 mg/dL — ABNORMAL HIGH (ref 70–99)
Glucose-Capillary: 127 mg/dL — ABNORMAL HIGH (ref 70–99)
Glucose-Capillary: 98 mg/dL (ref 70–99)

## 2018-06-11 MED ORDER — LACTULOSE 10 GM/15ML PO SOLN
20.0000 g | Freq: Two times a day (BID) | ORAL | Status: DC
  Administered 2018-06-11 – 2018-06-14 (×7): 20 g via ORAL
  Filled 2018-06-11 (×7): qty 30

## 2018-06-11 NOTE — Progress Notes (Signed)
Occupational Therapy Session Note  Patient Details  Name: Tamara Silva MRN: [id number] Date of Birth: [birth year]  Today's Date: 06/11/2018 OT Individual Time: 1017-1100 OT Individual Time Calculation (min): 43 min   Short Term Goals: Week 1:  OT Short Term Goal 1 (Week 1): Pt will complete BSC transfer with 2 assist and LRAD OT Short Term Goal 2 (Week 1): Pt will complete 1 self care task EOB to increase upright tolerance   Skilled Therapeutic Interventions/Progress Updates:    Pt greeted in bed, asleep, and difficult to wake. HOH for using cold wash cloth to increase alertness. Sternal rub also did not increase alertness. Per spouse, pt likes gospel music. Played gospel songs while OT applied Teds and conversed with her. Pt becoming more cooperative and responsive, also keeping eyes open. 02 sats 99-100% on 4L 02. She was agreeable to get OOB. With step by step cues (delivered slowly and calmly), pt reached EOB with 2 helpers. Sit<stand in [place] with 3 person Mod A. Pt able to maintain balance in semi perched position in [place] with steady assist while transferring to w/c. Post transfer, 02 sats 90%, increasing up to 94% in under a minute. With [facility] assist and max vcs for sequencing, pt washed hands at sink. She was then taken to dance group.   Therapy Documentation Precautions:  Precautions Precautions: Fall Precaution Comments: Per chart, 4L 02 at baseline Restrictions Weight Bearing Restrictions: No Pain: No s/s pain during tx  Pain Assessment Pain Scale: 0-10 Pain Score: 0-No pain ADL: ADL Eating: Not assessed Grooming: Not assessed Upper Body Bathing: Maximal assistance Where Assessed-Upper Body Bathing: Bed level Lower Body Bathing: Other (comment)(2 helpers) Where Assessed-Lower Body Bathing: Bed level Upper Body Dressing: Dependent Where Assessed-Upper Body Dressing: Bed level Lower Body Dressing: Dependent Where Assessed-Lower Body Dressing: Bed level Toileting: Not  assessed Toilet Transfer: Not assessed Walk-In Shower Transfer: Not assessed     Therapy/Group: Individual Therapy  Rune Mendez A Quin Mathenia 06/11/2018, 12:46 PM

## 2018-06-11 NOTE — Progress Notes (Signed)
Spoke with patients daughter tonight regarding patient's status. Daughter asked how therapy was going. Advised that patient was hard to to get out of bed today and very weak. 2 plus steady was used for transport and patient could not stand up completely. Patient could only tolerate sitting in the wheel for a short period of time, about 30 minitues before therapy had to put her back in the bed. Patient daughter states that the patient was bed bound at home. She only got up to bed side toilet with assistant. She states this is basically the patient base line physically. Daughter states that patient confusion is her major change.  Nicholes Rough, LPN

## 2018-06-11 NOTE — Progress Notes (Signed)
[facility] PHYSICAL MEDICINE & REHABILITATION PROGRESS NOTE  Subjective/Complaints: Patient seen sitting up in bed this morning.  She states she slept well overnight.  She states she had a good first day of therapies yesterday.  She notes she still constipated.  ROS: + Constipation.  Denies chest pain, shortness of breath, nausea, vomiting, diarrhea.  Objective: Vital Signs: Blood pressure 105/89, pulse 88, temperature 97.6 F (36.4 C), resp. rate 20, height 5' 7.5" (1.715 m), weight (!) 139.6 kg, last menstrual period 07/28/2013, SpO2 98 %. Dg Chest Port 1v Same Day  Result Date: 06/09/2018 CLINICAL DATA:  Wheezing short of breath EXAM: PORTABLE CHEST 1 VIEW COMPARISON:  06/04/2018 FINDINGS: Endotracheal tube removed Cardiac enlargement with mild vascular congestion. Negative for heart failure or edema. Negative for effusion Chronic pleural thickening and scarring left upper lobe. IMPRESSION: Cardiac enlargement with vascular congestion. Electronically Signed   By: Franchot Gallo M.D.   On: 06/09/2018 16:36   Recent Labs    06/10/18 0544  WBC 7.4  HGB 7.9*  HCT 32.2*  PLT 286   Recent Labs    06/10/18 0544  NA 141  K 3.9  CL 89*  CO2 45*  GLUCOSE 87  BUN 21*  CREATININE 0.92  CALCIUM 8.9    Physical Exam: BP 105/89 (BP Location: Right Arm)   Pulse 88   Temp 97.6 F (36.4 C)   Resp 20   Ht 5' 7.5" (1.715 m)   Wt (!) 139.6 kg   LMP 07/28/2013 Comment: perimenopausal  SpO2 98%   BMI 47.49 kg/m  Constitutional: NAD.  Morbidly obese. HENT: Normocephalic.  Atraumatic. Eyes:EOMI.  No discharge. Cardiovascular: RRR.  No JVD. Respiratory: She wheezes improving.  Unlabored Gastrointestinal: Bowel sounds normal.  Distended. Musculoskeletal: Edemapresent in bilateral lower extremities.  Neurological: Alert and oriented x3.   Dysarthria Motor: 4/5 grossly throughout.  Skin: Skin iswarm. Skin tears under breasts and dry flaky skin on abdomen and BLE not examined  today. Healed lesions on extremities. Psychiatric:  Pleasant and cooperative  Assessment/Plan: 1. Functional deficits secondary to debility which require 3+ hours per day of interdisciplinary therapy in a comprehensive inpatient rehab setting.  Physiatrist is providing close team supervision and 24 hour management of active medical problems listed below.  Physiatrist and rehab team continue to assess barriers to discharge/monitor patient progress toward functional and medical goals  Care Tool:  Bathing    Body parts bathed by patient: Face   Body parts bathed by helper: Right arm, Left arm, Chest, Abdomen, Front perineal area, Buttocks, Right upper leg, Left upper leg, Right lower leg, Left lower leg     Bathing assist Assist Level: Maximal Assistance - Patient 24 - 49%     Upper Body Dressing/Undressing Upper body dressing   What is the patient wearing?: Hospital gown only    Upper body assist Assist Level: Maximal Assistance - Patient 25 - 49%    Lower Body Dressing/Undressing Lower body dressing      What is the patient wearing?: Incontinence brief     Lower body assist Assist for lower body dressing: 2 Helpers     Toileting Toileting Toileting Activity did not occur (Clothing management and hygiene only): N/A (no void or bm)  Toileting assist Assist for toileting: 2 Helpers     Transfers Chair/bed transfer  Transfers assist  Chair/bed transfer activity did not occur: Safety/medical concerns        Locomotion Ambulation   Ambulation assist   Ambulation activity did  not occur: Safety/medical concerns          Walk 10 feet activity   Assist  Walk 10 feet activity did not occur: Refused        Walk 50 feet activity   Assist Walk 50 feet with 2 turns activity did not occur: Safety/medical concerns         Walk 150 feet activity   Assist Walk 150 feet activity did not occur: Safety/medical concerns         Walk 10 feet on  uneven surface  activity   Assist Walk 10 feet on uneven surfaces activity did not occur: Safety/medical concerns         Wheelchair     Assist Will patient use wheelchair at discharge?: Yes Type of Wheelchair: Manual Wheelchair activity did not occur: Safety/medical concerns         Wheelchair 50 feet with 2 turns activity    Assist    Wheelchair 50 feet with 2 turns activity did not occur: Safety/medical concerns       Wheelchair 150 feet activity     Assist Wheelchair 150 feet activity did not occur: Safety/medical concerns          Medical Problem List and Plan: 1.Functional deficitssecondary to metabolic encephalopathy, right occipital-parietal infact and debility after multiple medical issues  Continue CIR 2. DVT Prophylaxis/Anticoagulation: Pharmaceutical:Lovenox 3. Pain Management:Tylenol as needed 4. Mood:LCSW to follow for evaluation and support 5. Neuropsych: This patientis not fullycapable of making decisions on hisown behalf. 6. Skin/Wound Care:Routine pressure relief measures 7. Fluids/Electrolytes/Nutrition:Monitor I/O.   BMP within acceptable range except for elevated CO2 on 1/11  Labs ordered for tomorrow 8. COPD with chronic hypoxic respiratory failure: oxygen dependent--uses 4 L at baseline. 9. 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  Added nebs prn. 10. Acute on chronic CHF: Low-salt diet. On Coreg, statin. May need LFTs checked every 2 months.   Chest x-ray reviewed, suggesting fluid  Torsemide increased to 40 twice daily  Filed Weights   06/09/18 1425 06/10/18 0319 06/11/18 0438  Weight: (!) 142 kg (!) 140.3 kg (!) 139.6 kg   Improving on 1/12 11. Acute on chronic anemia: Improved post transfusion. Now back on ASA--monitor with serial checks.   Hemoglobin 7.9 on 1/11  Labs ordered for tomorrow 12.Elevated ammonia levels:    Level of 48 on 1/11  Lactulose started  1/12 13.Multifactorial encephalopathy: Has resolved--does not want cocaine abuse discussed with her daughter 34. T2DM: Monitor BS ac/hs. Continue Levemir with SSI. Intake variable --will continue to titrate as indicated.  Relatively controlled on 1/12  Monitor with increased mobility 15.  Morbid obesity  Encourage weight loss 16.  Constipation  Bowel regimen increased on 1/11, increased again on 1/12-lactulose added 17.  Labile blood pressure  Labile on 1/12  LOS: 2 days A FACE TO FACE EVALUATION WAS PERFORMED  Ankit Lorie Phenix 06/11/2018, 7:21 AM

## 2018-06-11 NOTE — Progress Notes (Signed)
Inpatient Rehabilitation  Patient information reviewed and entered into eRehab system by Revanth Neidig M. Cardale Dorer, M.A., CCC/SLP, PPS Coordinator.  Information including medical coding, functional ability and quality indicators will be reviewed and updated through discharge.    

## 2018-06-12 ENCOUNTER — Inpatient Hospital Stay (HOSPITAL_COMMUNITY): Payer: Self-pay | Admitting: Occupational Therapy

## 2018-06-12 ENCOUNTER — Telehealth: Payer: Self-pay | Admitting: Acute Care

## 2018-06-12 ENCOUNTER — Inpatient Hospital Stay (HOSPITAL_COMMUNITY): Payer: Medicaid Other

## 2018-06-12 ENCOUNTER — Inpatient Hospital Stay (HOSPITAL_COMMUNITY): Payer: Self-pay

## 2018-06-12 ENCOUNTER — Inpatient Hospital Stay (HOSPITAL_COMMUNITY): Payer: Self-pay | Admitting: Physical Therapy

## 2018-06-12 DIAGNOSIS — E7220 Disorder of urea cycle metabolism, unspecified: Secondary | ICD-10-CM

## 2018-06-12 DIAGNOSIS — G9340 Encephalopathy, unspecified: Secondary | ICD-10-CM

## 2018-06-12 DIAGNOSIS — I5033 Acute on chronic diastolic (congestive) heart failure: Secondary | ICD-10-CM

## 2018-06-12 DIAGNOSIS — K5901 Slow transit constipation: Secondary | ICD-10-CM

## 2018-06-12 LAB — GLUCOSE, CAPILLARY
Glucose-Capillary: 191 mg/dL — ABNORMAL HIGH (ref 70–99)
Glucose-Capillary: 94 mg/dL (ref 70–99)
Glucose-Capillary: 136 mg/dL — ABNORMAL HIGH (ref 70–99)
Glucose-Capillary: 129 mg/dL — ABNORMAL HIGH (ref 70–99)
Glucose-Capillary: 124 mg/dL — ABNORMAL HIGH (ref 70–99)

## 2018-06-12 LAB — CBC
HCT: 31.6 % — ABNORMAL LOW (ref 36.0–46.0)
Hemoglobin: 8.1 g/dL — ABNORMAL LOW (ref 12.0–15.0)
MCH: 21.7 pg — ABNORMAL LOW (ref 26.0–34.0)
MCHC: 25.6 g/dL — ABNORMAL LOW (ref 30.0–36.0)
MCV: 84.7 fL (ref 80.0–100.0)
Platelets: 240 10*3/uL (ref 150–400)
RBC: 3.73 MIL/uL — ABNORMAL LOW (ref 3.87–5.11)
RDW: 33.6 % — ABNORMAL HIGH (ref 11.5–15.5)
WBC: 8 10*3/uL (ref 4.0–10.5)
nRBC: 1.1 % — ABNORMAL HIGH (ref 0.0–0.2)

## 2018-06-12 LAB — BASIC METABOLIC PANEL
BUN: 19 mg/dL (ref 6–20)
CO2: >50 mmol/L — ABNORMAL HIGH (ref 22–32)
Calcium: 9.1 mg/dL (ref 8.9–10.3)
Chloride: 83 mmol/L — ABNORMAL LOW (ref 98–111)
Creatinine, Ser: 1.06 mg/dL — ABNORMAL HIGH (ref 0.44–1.00)
GFR calc Af Amer: >60 mL/min (ref >60)
GFR calc non Af Amer: 58 mL/min — ABNORMAL LOW (ref >60)
Glucose, Bld: 96 mg/dL (ref 70–99)
Potassium: 3.4 mmol/L — ABNORMAL LOW (ref 3.5–5.1)
Sodium: 141 mmol/L (ref 135–145)

## 2018-06-12 LAB — BLOOD GAS, ARTERIAL
Acid-Base Excess: 27.4 mmol/L — ABNORMAL HIGH (ref 0.0–2.0)
Bicarbonate: 54.7 mmol/L — ABNORMAL HIGH (ref 20.0–28.0)
Drawn by: 53547
O2 Content: 3 L/min
O2 Saturation: 97.4 %
PH ART: 7.356 (ref 7.350–7.450)
PO2 ART: 97 mmHg (ref 83.0–108.0)
Patient temperature: 98.6
pCO2 arterial: 100 mmHg (ref 32.0–48.0)

## 2018-06-12 MED ORDER — ACETAZOLAMIDE SODIUM 500 MG IJ SOLR
500.0000 mg | Freq: Once | INTRAMUSCULAR | Status: AC
  Administered 2018-06-12: 500 mg via INTRAVENOUS
  Filled 2018-06-12: qty 500

## 2018-06-12 NOTE — Progress Notes (Addendum)
[employer] PHYSICAL MEDICINE & REHABILITATION PROGRESS NOTE  Subjective/Complaints: Pt states that she slept fairly well last night. Denies any problems today. Breathing seems to be at baseline  ROS: Patient denies fever, rash, sore throat, blurred vision, nausea, vomiting, diarrhea, cough, shortness of breath or chest pain, joint or back pain, headache, or mood change.   Objective: Vital Signs: Blood pressure 120/62, pulse 71, temperature (!) 96.9 F (36.1 C), temperature source Axillary, resp. rate 20, height 5' 7.5" (1.715 m), weight (!) 138.9 kg, last menstrual period 07/28/2013, SpO2 98 %. No results found. Recent Labs    06/10/18 0544  WBC 7.4  HGB 7.9*  HCT 32.2*  PLT 286   Recent Labs    06/10/18 0544  NA 141  K 3.9  CL 89*  CO2 45*  GLUCOSE 87  BUN 21*  CREATININE 0.92  CALCIUM 8.9    Physical Exam: BP 120/62 (BP Location: Right Arm)   Pulse 71   Temp (!) 96.9 F (36.1 C) (Axillary)   Resp 20   Ht 5' 7.5" (1.715 m)   Wt (!) 138.9 kg   LMP 07/28/2013 Comment: perimenopausal  SpO2 98%   BMI 47.25 kg/m  Constitutional: No distress . Vital signs reviewed. HEENT: EOMI, oral membranes moist Neck: supple Cardiovascular: RRR with systolic murmur. No JVD    Respiratory: improved air movement, less congestion. Still with rhonchi. Decreased work of breathing    GI: BS +, non-tender, non-distended  Musculoskeletal: Edemapresent in bilateral lower extremities.  Neurological: seems more alert, oriented to person, place, reason.   Dysarthric but volume improved Motor: 4/5 grossly throughout.  Skin: Skin iswarm. Skin tears under breasts and dry flaky skin on abdomen and BLE---unchanged today Healed lesions on extremities. Psychiatric:  Pleasant and cooperative  Assessment/Plan: 1. Functional deficits secondary to debility which require 3+ hours per day of interdisciplinary therapy in a comprehensive inpatient rehab setting.  Physiatrist is providing  close team supervision and 24 hour management of active medical problems listed below.  Physiatrist and rehab team continue to assess barriers to discharge/monitor patient progress toward functional and medical goals  Care Tool:  Bathing    Body parts bathed by patient: Face   Body parts bathed by helper: Right arm, Left arm, Chest, Abdomen, Front perineal area, Buttocks, Right upper leg, Left upper leg, Right lower leg, Left lower leg     Bathing assist Assist Level: Maximal Assistance - Patient 24 - 49%     Upper Body Dressing/Undressing Upper body dressing   What is the patient wearing?: Hospital gown only    Upper body assist Assist Level: Maximal Assistance - Patient 25 - 49%    Lower Body Dressing/Undressing Lower body dressing      What is the patient wearing?: Incontinence brief     Lower body assist Assist for lower body dressing: 2 Helpers(pt turns with assistance)     Toileting Toileting Toileting Activity did not occur (Clothing management and hygiene only): (Incontinent)  Toileting assist Assist for toileting: 2 Helpers(incontinent)     Transfers Chair/bed transfer  Transfers assist  Chair/bed transfer activity did not occur: Safety/medical concerns  Chair/bed transfer assist level: Dependent - Patient 0%     Locomotion Ambulation   Ambulation assist   Ambulation activity did not occur: Safety/medical concerns          Walk 10 feet activity   Assist  Walk 10 feet activity did not occur: Refused        Walk 50  feet activity   Assist Walk 50 feet with 2 turns activity did not occur: Safety/medical concerns         Walk 150 feet activity   Assist Walk 150 feet activity did not occur: Safety/medical concerns         Walk 10 feet on uneven surface  activity   Assist Walk 10 feet on uneven surfaces activity did not occur: Safety/medical concerns         Wheelchair     Assist Will patient use wheelchair at  discharge?: Yes Type of Wheelchair: Manual Wheelchair activity did not occur: Safety/medical concerns         Wheelchair 50 feet with 2 turns activity    Assist    Wheelchair 50 feet with 2 turns activity did not occur: Safety/medical concerns       Wheelchair 150 feet activity     Assist Wheelchair 150 feet activity did not occur: Safety/medical concerns          Medical Problem List and Plan: 1.Functional deficitssecondary to metabolic encephalopathy, right occipital-parietal infact and debility after multiple medical issues  -Continue CIR therapies including PT, OT, and SLP   -per RN's note and discussion with family, pt very sedentary PTA. May not be too far from baseline 2. DVT Prophylaxis/Anticoagulation: Pharmaceutical:Lovenox 3. Pain Management:Tylenol as needed 4. Mood:LCSW to follow for evaluation and support 5. Neuropsych: This patientis not fullycapable of making decisions on hisown behalf. 6. Skin/Wound Care:Routine pressure relief measures 7. Fluids/Electrolytes/Nutrition:Monitor I/O.   I personally reviewed the patient's labs today.   8. COPD with chronic hypoxic respiratory failure: oxygen dependent--uses 4 L at baseline.  -CO2 trending up (>50 today).   -recheck labs serially  -may need to consult pulmonary  -BIPAP as below  -IS, OOB  -check ABG, xrays as below 9. 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  Added nebs prn. 10. Acute on chronic CHF: Low-salt diet. On Coreg, statin. May need LFTs checked every 2 months.   Chest x-ray on 1/10 with congestion  Torsemide increased to 40 twice daily on 1/11  -follow up CXR today 1/13, check ABG Filed Weights   06/10/18 0319 06/11/18 0438 06/12/18 0506  Weight: (!) 140.3 kg (!) 139.6 kg (!) 138.9 kg   Weights trending down  -appears more comfortable today, air movement better on exam 11. Acute on chronic anemia: Improved post transfusion. Now  back on ASA--monitor with serial checks.   Hemoglobin 7.9 on 1/11  Labs pending today 12.Elevated ammonia levels:    Level of 48 on 1/11  Lactulose started 1/12 13.Multifactorial encephalopathy: appears to be improving with rx of ammonia and CHF  -continue to monitor clinically 14. T2DM: Monitor BS ac/hs. Continue Levemir with SSI. Intake variable --will continue to titrate as indicated.  Relatively controlled on 1/13  No changes today 15.  Morbid obesity  Encourage weight loss 16.  Constipation  Bowel regimen increased on 1/11, increased again on 1/12-lactulose added  -still no bm  -check KUB today 17.  Labile blood pressure  Fair control at present  LOS: 3 days A FACE TO FACE EVALUATION WAS PERFORMED  Meredith Staggers 06/12/2018, 7:28 AM

## 2018-06-12 NOTE — IPOC Note (Signed)
Overall Plan of Care [facility]) Patient Details Name: Doyle Tegethoff MRN: [id number] DOB: Jul 23, 1960  Admitting Diagnosis: <principal problem not specified>  Hospital Problems: Active Problems:   Encephalopathy   Wheezing   Hyperammonemia (HCC)   Acute on chronic diastolic CHF (congestive heart failure) (HCC)   Supplemental oxygen dependent   Slow transit constipation   Labile blood pressure     Functional Problem List: Nursing Skin Integrity, Bowel, Endurance, Medication Management, Safety  PT Balance, Behavior, Edema, Endurance, Motor, Nutrition, Perception, Safety, Sensory, Skin Integrity  OT Balance, Behavior, Skin Integrity, Cognition, Endurance, Motor, Safety  SLP    TR         Basic ADL's: OT Eating, Grooming, Bathing, Dressing, Toileting     Advanced  ADL's: OT Simple Meal Preparation, Full Meal Preparation     Transfers: PT Bed Mobility, Bed to Chair, Car  OT Toilet     Locomotion: PT Ambulation, Wheelchair Mobility     Additional Impairments: OT None  SLP Swallowing, Social Cognition   Attention, Awareness, Memory, Problem Solving  TR      Anticipated Outcomes Item Anticipated Outcome  Self Feeding No goal   Swallowing  Mod I   Basic self-care  Mod A sit<stand in prep for self care + pt will be able to stand for 3 minutes for caregiver to complete LB self care  Toileting  No goal- Total care PTA   Bathroom Transfers Mod A BSC transfer  Bowel/Bladder  min assist  Transfers  Min assist with LRAD   Locomotion  Min assist ambulation for short household distances.   Communication     Cognition  Mod A  Pain  n/a  Safety/Judgment  maintain with min assist   Therapy Plan: PT Intensity: Minimum of 1-2 x/day ,45 to 90 minutes PT Frequency: 5 out of 7 days PT Duration Estimated Length of Stay: 2-3 weeks  OT Intensity: Minimum of 1-2 x/day, 45 to 90 minutes OT Duration/Estimated Length of Stay: 3 weeks SLP Intensity: Minumum of 1-2 x/day, 30 to  90 minutes SLP Frequency: 3 to 5 out of 7 days SLP Duration/Estimated Length of Stay: 16-19 days     Team Interventions: Nursing Interventions Patient/Family Education, Bowel Management, Skin Care/Wound Management, Dysphagia/Aspiration Precaution Training, Disease Management/Prevention, Medication Management, Discharge Planning  PT interventions Ambulation/gait training, Balance/vestibular training, Cognitive remediation/compensation, Community reintegration, Discharge planning, Disease management/prevention, DME/adaptive equipment instruction, Functional electrical stimulation, Neuromuscular re-education, Patient/family education, Functional mobility training, Pain management, Psychosocial support, Splinting/orthotics, Therapeutic Activities, UE/LE Strength taining/ROM, Visual/perceptual remediation/compensation, Wheelchair propulsion/positioning, UE/LE Coordination activities, Therapeutic Exercise, Stair training, Skin care/wound management  OT Interventions Balance/vestibular training, Disease mangement/prevention, Self Care/advanced ADL retraining, Therapeutic Exercise, Wheelchair propulsion/positioning, Cognitive remediation/compensation, DME/adaptive equipment instruction, Pain management, Skin care/wound managment, UE/LE Strength taining/ROM, UE/LE Coordination activities, Patient/family education, Community reintegration, Discharge planning, Functional mobility training, Psychosocial support, Therapeutic Activities, Neuromuscular re-education  SLP Interventions Cognitive remediation/compensation, Cueing hierarchy, Dysphagia/aspiration precaution training, Functional tasks  TR Interventions    SW/CM Interventions Discharge Planning, Psychosocial Support, Patient/Family Education   Barriers to Discharge MD  Medical stability and Weight  Nursing      PT Inaccessible home environment, Decreased caregiver support, Medical stability, Home environment access/layout, Behavior    OT Medical  stability, Incontinence, Wound Care, Lack of/limited family support, Weight, Behavior    SLP      SW       Team Discharge Planning: Destination: PT-Home ,OT- Home , SLP-Home Projected Follow-up: PT-Home health PT, OT-  Home health OT, 24 hour  supervision/assistance, SLP-Home Health SLP, Outpatient SLP, 24 hour supervision/assistance Projected Equipment Needs: PT-To be determined, OT- To be determined, SLP-None recommended by SLP Equipment Details: PT- , OT-  Patient/family involved in discharge planning: PT- Patient, Family member/caregiver,  OT-Patient, Family member/caregiver, SLP-Patient  MD ELOS: 17-21 days Medical Rehab Prognosis:  Good Assessment: The patient has been admitted for CIR therapies with the diagnosis of debility and encephalopathy. The team will be addressing functional mobility, strength, stamina, balance, safety, adaptive techniques and equipment, self-care, bowel and bladder mgt, patient and caregiver education, NMR, activity tolerance, breathing techniques, community reentry. Goals have been set at min to mod assist for mobility and self-care, and min to mod assist with cognition (believe cognition, however, will improve with rx of medical conditions).   Meredith Staggers, MD, FAAPMR      See Team Conference Notes for weekly updates to the plan of care

## 2018-06-12 NOTE — Progress Notes (Signed)
Social Work Patient ID: Trinidy Masterson, female   DOB: 1960-11-28, 58 y.o.   MRN: [id number]   CSW attempted to see pt twice and she was unavailable and then asleep and would not wake to CSW's voice, was on BiPAP.  CSW left business card and will meet with pt/family tomorrow.

## 2018-06-12 NOTE — Progress Notes (Signed)
Occupational Therapy Session Note  Patient Details  Name: Tamara Silva MRN: [id number] Date of Birth: 1961/05/01  Today's Date: 06/12/2018 OT Individual Time: 9794-8016 OT Individual Time Calculation (min): 62 min    Short Term Goals: Week 1:  OT Short Term Goal 1 (Week 1): Pt will complete BSC transfer with 2 assist and LRAD OT Short Term Goal 2 (Week 1): Pt will complete 1 self care task EOB to increase upright tolerance   Skilled Therapeutic Interventions/Progress Updates:    Patient in bed upon arrival with significant other present.  She denies pain, is oriented to self, date and place today.  She requires encouragement to participated but is cooperative overall.   LB bathing - max A, dressing max A UB bathing and dressing declined - changed hospital gown with min A Bed mobility with min A rolling and SSP to/from supine Sit to stand at edge of bed min A of 2, max A to complete CM in stance at edge of bed, she was able to side step with CG A toward head of bed  Tolerated unsupported siting at edge of bed for 5 minutes - c/o fatigue and requests to lay back down  Returned to supine position,completed upper body AROM activities for 10 minutes with min encouragement. Bed alarm set at close of session.    Therapy Documentation Precautions:  Precautions Precautions: Fall Precaution Comments: Per chart, 4L 02 at baseline Restrictions Weight Bearing Restrictions: No General:   Vital Signs:  Pain: Pain Assessment Pain Scale: 0-10 Pain Score: 0-No pain   Other Treatments:     Therapy/Group: Individual Therapy  Carlos Levering 06/12/2018, 10:55 AM

## 2018-06-12 NOTE — Telephone Encounter (Signed)
Will route to SG so she is aware.

## 2018-06-12 NOTE — Progress Notes (Signed)
Critical ABG results reported to RN.   pH: 7.35 CO2: 100 PO2: 97

## 2018-06-12 NOTE — Progress Notes (Signed)
Pt placed on Bipap at this time due to ABG results. Pt awake & alert, tolerating well.

## 2018-06-12 NOTE — Progress Notes (Signed)
Occupational Therapy Note  Patient Details  Name: Tamara Silva MRN: [id number] Date of Birth: 1961-04-03  Pt missed 30 mins OT session this pm secondary to nursing recommendation based on CO2 levels and pulmonary/respiratory issues.      Meda Dudzinski OTR/L 06/12/2018, 1:06 PM

## 2018-06-12 NOTE — Consult Note (Signed)
NAME:  Tamara Silva, MRN:  [id number], DOB:  07/15/1960, LOS: 3 ADMISSION DATE:  06/09/2018, CONSULTATION DATE:  1/13 REFERRING MD:  Naaman Plummer, CHIEF COMPLAINT:  Hypercarbic respiratory failure    Brief History   This is an obese 58 year old female with a history of COPD, chronic respiratory failure on 4 L at all times also has a history of severe sleep apnea and obesity hypoventilation. She has been rehabilitating in inpatient rehab since 1/10 status post sub-acute CVA.  Been treated with aggressive diuresis for volume overload, has been intermittently compliant with positive pressure therapy since her initial admission on 12/30.  Pulmonary asked to see in consultation on 1/13 for progressive hypercarbic respiratory failure.  History of present illness    Is a 58 year old African-American female whom we initially saw on consult on 12/30 For acute on chronic hypoxic and hypercarbic respiratory failure which we felt was primarily secondary to pulmonary edema and decompensated OHS OSA.  She had initially been admitted for chief complaint of weakness, worsening lightheadedness, and decreased activity Tolerance.  There is no chest pain shortness of breath palpitations presyncopal noted.  Initially had a hemoglobin of 5.7 and was transfused for this.  On 12/21 she had a rapid response called.  She was found to have acute frothy blood-tinged sputum, worsening shortness of breath, PCO2 of 90.80 and a pH of 7.24 her serum bicarbonate at that time was 37.5 calculated.  Pulmonary was asked to see for acute on chronic hypercarbic respiratory failure.  We felt this primarily pressure was the result of pulmonary edema, as well as acute decompensated biventricular heart failure.  She improved with BiPAP and diuretic therapy. During her hospital stay she was slowly progressing unfortunately she had an acute mental status change on 1/5 code stroke was activated.  She was once again found to be hypercarbic and was  restarted on BiPAP therapy.  She was noted prior to that event that she was not using prescribed positive pressure at that point. A CT of head was obtained, this did show concern for recent right hemisphere stroke, timing was unclear as to when.  She subsequently suffered a seizure on 1/6 and was unable to protect her airway following that she required intubation.  Critical care had been reconsulted at that time to assist with ventilator management.  She was ultimately extubated, slowly progressed, and eventually transferred to inpatient rehab on 1/10.  Since her time in rehab the medical team headache aggressively been adjusting her diuretics due to pulmonary edema, they noted progressive metabolic alkalosis, and follow-up ABG was obtained today on 1/13 at which time she was found to have significant respiratory acidosis with a PCO2 of 100.  Pulmonary was asked to see due to her worsening hypercarbia Past Medical History  Gold 3 COPD, Chronic respiratory failure, severe sleep apnea with noncompliance with CPAP.  Recent stroke. Significant Hospital Events     Consults:    Procedures:    Significant Diagnostic Tests:   Micro Data:    Antimicrobials:    Interim history/subjective:  Denies distress  Objective   Blood pressure 120/62, pulse 71, temperature (Abnormal) 96.9 F (36.1 C), temperature source Axillary, resp. rate 20, height 5' 7.5" (1.715 m), weight (Abnormal) 138.9 kg, last menstrual period 07/28/2013, SpO2 98 %.        Intake/Output Summary (Last 24 hours) at 06/12/2018 1326 Last data filed at 06/12/2018 0750 Gross per 24 hour  Intake 390 ml  Output no documentation  Net 390 ml  Filed Weights   06/10/18 0319 06/11/18 0438 06/12/18 0506  Weight: (Abnormal) 140.3 kg (Abnormal) 139.6 kg (Abnormal) 138.9 kg    Examination: General: Obese 57 year old female severely debilitated but not in acute distress slightly dysarthric HENT: Normocephalic atraumatic no jugular  venous distention mucous membranes moist Lungs: Clear to auscultation diminished bases Cardiovascular: Regular rate and rhythm Abdomen: Obese Extremities: Warm dry trace lower extremity edema Neuro: Awake oriented speech somewhat dysarthric GU: Voids  Resolved Hospital Problem list    Assessment & Plan:   Chronic hypoxic and hypercarbic respiratory failure Gold 3 COPD Obesity hypoventilation syndrome Obstructive sleep apnea Severe deconditioning Recent CVA Diabetes Hypertension Cor pulmonale Probable pulmonary hypertension Diastolic dysfunction Contraction alkalosis  Acute on chronic hypercarbic respiratory failure in the setting of severe OHS, OSA, severe deconditioning, And gold 3 COPD.  Suspect possibly further complicated by element of contraction alkalosis as evidenced by progressive rise in serum CO2, worsening renal function, and escalation of recent diuretic regimen Plan/rec Limited here, already on 18/5 BiPAP therapy Would continue using BiPAP nightly and with naps.  CPAP is not acceptable for her, she is failed this before Discontinue torsemide One-time Diamox, then hold further diuresis for now Repeat ABG in a.m. No role for intubation at this point as she is protecting her airway and is fully awake however if she were to fail again tracheostomy and nocturnal ventilation may be something we need to consider   Labs   CBC: Recent Labs  Lab 06/06/18 1022 06/08/18 0322 06/10/18 0544 06/12/18 0720  WBC 9.0 8.1 7.4 8.0  NEUTROABS  --   --  5.2  --   HGB 9.0* 7.9* 7.9* 8.1*  HCT 36.2 31.0* 32.2* 31.6*  MCV 81.3 83.6 84.3 84.7  PLT 381 349 286 633    Basic Metabolic Panel: Recent Labs  Lab 06/06/18 1022 06/08/18 0322 06/10/18 0544 06/12/18 0720  NA 141 139 141 141  K 3.8 3.7 3.9 3.4*  CL 93* 92* 89* 83*  CO2 42* 41* 45* >50*  GLUCOSE 79 195* 87 96  BUN 16 22* 21* 19  CREATININE 0.94 1.00 0.92 1.06*  CALCIUM 8.6* 8.2* 8.9 9.1   GFR: Estimated  Creatinine Clearance: 85.1 mL/min (A) (by C-G formula based on SCr of 1.06 mg/dL (H)). Recent Labs  Lab 06/06/18 1022 06/08/18 0322 06/10/18 0544 06/12/18 0720  WBC 9.0 8.1 7.4 8.0    Liver Function Tests: Recent Labs  Lab 06/06/18 1022 06/08/18 0322 06/10/18 0544  AST 25 21 23   ALT 14 13 15   ALKPHOS 67 80 79  BILITOT 1.5* 1.1 1.1  PROT 8.8* 8.5* 8.8*  ALBUMIN 2.5* 2.5* 2.6*   No results for input(s): LIPASE, AMYLASE in the last 168 hours. Recent Labs  Lab 06/06/18 1022 06/10/18 0544  AMMONIA 55* 48*    ABG    Component Value Date/Time   PHART 7.356 06/12/2018 1110   PCO2ART 100 (HH) 06/12/2018 1110   PO2ART 97.0 06/12/2018 1110   HCO3 54.7 (H) 06/12/2018 1110   TCO2 >50 (H) 06/06/2018 0906   O2SAT 97.4 06/12/2018 1110     Coagulation Profile: No results for input(s): INR, PROTIME in the last 168 hours.  Cardiac Enzymes: No results for input(s): CKTOTAL, CKMB, CKMBINDEX, TROPONINI in the last 168 hours.  HbA1C: Hemoglobin A1C  Date/Time Value Ref Range Status  01/23/2018 02:46 PM 8.0 (A) 4.0 - 5.6 % Final  10/06/2016 04:43 PM 10.0  Final   HbA1c, POC (prediabetic range)  Date/Time Value Ref Range  Status  01/23/2018 02:46 PM 8 (A) 5.7 - 6.4 % Final   HbA1c, POC (controlled diabetic range)  Date/Time Value Ref Range Status  01/23/2018 02:46 PM 8.0 (A) 0.0 - 7.0 % Final   HbA1c POC (<> result, manual entry)  Date/Time Value Ref Range Status  01/23/2018 02:46 PM 8 4.0 - 5.6 % Final   Hgb A1c MFr Bld  Date/Time Value Ref Range Status  06/01/2018 10:24 AM 7.1 (H) 4.8 - 5.6 % Final    Comment:    (NOTE) Pre diabetes:          5.7%-6.4% Diabetes:              >6.4% Glycemic control for   <7.0% adults with diabetes   05/21/2015 05:26 AM 7.9 (H) 4.8 - 5.6 % Final    Comment:    (NOTE)         Pre-diabetes: 5.7 - 6.4         Diabetes: >6.4         Glycemic control for adults with diabetes: <7.0     CBG: Recent Labs  Lab 06/11/18 1158  06/11/18 1637 06/11/18 2119 06/12/18 0642 06/12/18 1136  GLUCAP 118* 127* 98 94 136*    Review of Systems:   Not able to provide ROS   Past Medical History  She,  has a past medical history of Asthma, CAD (coronary artery disease), Chronic combined systolic (congestive) and diastolic (congestive) heart failure ([place]), Chronic iron deficiency anemia, Diabetes mellitus without complication ([place]), DOE (dyspnea on exertion), Edema of both legs, Hypertension, and Pulmonary emboli ([place]) (2014).   Surgical History    Past Surgical History:  Procedure Laterality Date  . COLONOSCOPY WITH PROPOFOL N/A 08/03/2013   Procedure: COLONOSCOPY WITH PROPOFOL;  Surgeon: Jeryl Columbia, MD;  Location: [facility];  Service: Endoscopy;  Laterality: N/A;  . CORONARY ANGIOPLASTY WITH STENT PLACEMENT     CAD in 2006 x 2 and 2009 2 more- place din REx in [place] and [place] med  . CORONARY ANGIOPLASTY WITH STENT PLACEMENT  10/07/2013   Xience [place] DES 2.75  mm x 15  mm  . ESOPHAGOGASTRODUODENOSCOPY (EGD) WITH PROPOFOL N/A 08/03/2013   Procedure: ESOPHAGOGASTRODUODENOSCOPY (EGD) WITH PROPOFOL;  Surgeon: Jeryl Columbia, MD;  Location: [facility];  Service: Endoscopy;  Laterality: N/A;  . LEFT HEART CATHETERIZATION WITH CORONARY ANGIOGRAM N/A 10/07/2013   Procedure: LEFT HEART CATHETERIZATION WITH CORONARY ANGIOGRAM;  Surgeon: Leonie Man, MD;  Location: [facility];  Service: Cardiovascular;  Laterality: N/A;     Social History   reports that she quit smoking about 4 years ago. Her smoking use included cigarettes. She has a 10.00 pack-year smoking history. She has never used smokeless tobacco. She reports that she does not drink alcohol or use drugs.   Family History   Her family history includes Asthma in her paternal aunt; Breast cancer in her paternal aunt; Colon cancer in her father; Diabetes in her father; Emphysema in her brother; Heart attack in her father; Hypertension in her mother; Lung disease in her  brother; Sarcoidosis in an other family member; Stroke in her mother.   Allergies Allergies  Allergen Reactions  . Metolazone Other (See Comments)    Dizziness and falling  . Plavix [Clopidogrel Bisulfate] Other (See Comments)    High PRU's - non-responder  . Ibuprofen     Patient stated she can not take ibuprofen.   . Nsaids Other (See Comments)    "I do  not take NSAIDs d/t interfering with other meds"     Home Medications  Prior to Admission medications   Medication Sig Start Date End Date Taking? Authorizing Provider  acetaminophen (TYLENOL) 325 MG tablet Take 2 tablets (650 mg total) by mouth every 6 (six) hours as needed for mild pain or fever. 06/09/18   [place] Shi, MD  aspirin 81 MG tablet Take 1 tablet (81 mg total) by mouth daily. 10/09/13   Barrett, Evelene Croon, PA-C  carvedilol (COREG) 12.5 MG tablet TAKE 1 TABLET BY MOUTH 2 TIMES A DAY WITH A MEAL **PT NEEDS TO SCHEDULE AN APPT WITH OUR OFFICE AT [id number] FOR FUTURE REFILLS 04/25/18   Larey Dresser, MD  chlorhexidine gluconate, MEDLINE KIT, (PERIDEX) 0.12 % solution 15 mLs by Mouth Rinse route 2 (two) times daily. 06/09/18   [place] Shi, MD  fluticasone (FLONASE) 50 MCG/ACT nasal spray INSTILL 2 SPRAYS IN EACH NOSTRIL TWICE DAILY. 04/08/17   Tresa Garter, MD  heparin 5000 UNIT/ML injection Inject 1 mL (5,000 Units total) into the skin every 8 (eight) hours. 06/09/18   [place] Shi, MD  hydrALAZINE (APRESOLINE) 20 MG/ML injection Inject 0.25 mLs (5 mg total) into the vein every 6 (six) hours as needed (SBP > 160). 06/09/18   [place] Shi, MD  insulin aspart (NOVOLOG) 100 UNIT/ML injection Inject 0-9 Units into the skin 3 (three) times daily with meals. 06/09/18   [place] Shi, MD  insulin detemir (LEVEMIR) 100 UNIT/ML injection Inject 0.15 mLs (15 Units total) into the skin daily. 06/10/18   [place] Shi, MD  ipratropium-albuterol (DUONEB) 0.5-2.5 (3) MG/3ML SOLN Use nebulizer to inhale  contents of one vial every 6 hours as needed for shortness of breath, wheezing 01/27/18   Fulp, Cammie, MD  iron polysaccharides (NIFEREX) 150 MG capsule Take 1 capsule (150 mg total) by mouth daily. Patient not taking: Reported on 05/29/2018 01/27/18   Fulp, Ander Gaster, MD  nitroGLYCERIN (NITROSTAT) 0.4 MG SL tablet Place 1 tablet (0.4 mg total) under the tongue every 5 (five) minutes as needed for chest pain. 01/27/18   Fulp, Cammie, MD  omeprazole (PRILOSEC) 40 MG capsule TAKE 1 CAPSULE BY MOUTH TWICE DAILY. 04/08/17   Tresa Garter, MD  ondansetron (ZOFRAN) 4 MG tablet Take 1 tablet (4 mg total) by mouth every 6 (six) hours as needed for nausea. 06/09/18   [place] Shi, MD  pantoprazole (PROTONIX) 40 MG tablet Take 1 tablet (40 mg total) by mouth daily at 12 noon. 06/10/18   [place] Shi, MD  polyethylene glycol (MIRALAX / GLYCOLAX) packet Take 17 g by mouth daily. 01/27/18   Fulp, Cammie, MD  rosuvastatin (CRESTOR) 40 MG tablet Take 1 tablet (40 mg total) by mouth daily at 6 PM. 06/09/18   [place] Shi, MD  torsemide (DEMADEX) 20 MG tablet Take 3 tablets (60 mg total) by mouth daily. 06/10/18   [place]ford Shi, MD     Critical care time: na    Erick Colace ACNP-BC [place] Pager # [phone] OR # [phone] if no answer

## 2018-06-12 NOTE — Progress Notes (Signed)
Speech Language Pathology Daily Session Note  Patient Details  Name: Tamara Silva MRN: [id number] Date of Birth: 1961/03/31  Today's Date: 06/12/2018 SLP Individual Time: 4801-6553 SLP Individual Time Calculation (min): 37 min  Short Term Goals: Week 1: SLP Short Term Goal 1 (Week 1): Pt will demonstrate sustained attention to functional task in 2 minute intervals with mod A verbal cues to redirect. SLP Short Term Goal 2 (Week 1): Pt will demonstrate basic and functional problem solving skills in functional tasks with max A verbal cues.  SLP Short Term Goal 3 (Week 1): Pt will self-monitor functional errors during functional tasks with max A verbal cues.  SLP Short Term Goal 4 (Week 1): Pt will demonstrate recall of daily information with max A verbal cues to utilizie external aid.  SLP Short Term Goal 5 (Week 1): Pt will consume trials of regular textured foods with appropriate oral clearance and min overt s/s aspiration over 3 sessions piror to solid upgrade.  Skilled Therapeutic Interventions:Skilled ST services focused on cognitive skills. Pt missed 8 minutes of skilled ST treatment due to being out for x-ray. SLP attempted to gather more information about baseline function from pt and pt's boyfriend [name], who was present, however pt appeared to become upset by questioning stating, " I don't like you being so nosey." and [name] stated "yeah ok." suggesting that she is at baseline. SLP attempted to continue questioning givning reasoning for inquiry, however pt became more upset. Pt stated "you need to slow down. You come all in here." SLP provide education of delayed processing and pt states agreement in changes. SLP facilitated basic problem solving skills sequencing 3 step picture cards, pt required total A to functional arrange cards and max A verbal cues to provided verbal sequence with total-max A for awareness errors. Pt demonstrated increase sustained attention requiring max-mod A verbal  cues in 2 minute intervals. Pt was left in room with call bell within reach and bed alarm set. Recommend to continue skilled ST services.       Pain Pain Assessment Pain Scale: 0-10 Pain Score: 0-No pain  Therapy/Group: Individual Therapy  Zully Frane  [facility] 06/12/2018, 12:46 PM

## 2018-06-12 NOTE — Progress Notes (Signed)
CRITICAL VALUE ALERT  Critical Value:  ABG                         PH 7.35                         PCO2 100                         PO2 97.0   Date & Time Notied:  06-12-18 1125  Provider Notified: left message for Dr Naaman Plummer and notified Linna Hoff, [place]  Orders Received/Actions taken: discussed with Linna Hoff, waiting for Dr. Naaman Plummer to reviewed

## 2018-06-12 NOTE — Progress Notes (Signed)
Physical Therapy Session Note  Patient Details  Name: Tamara Silva MRN: [id number] Date of Birth: 1960/07/23  Today's Date: 06/12/2018 PT Individual Time: 1100-1135 PT Individual Time Calculation (min): 35 min   Short Term Goals: Week 1:  PT Short Term Goal 1 (Week 1): Pt will perform bed<>WC transfer with mod assist  PT Short Term Goal 2 (Week 1): Pt will consistently participate in therapy with minimal encouragement.  PT Short Term Goal 3 (Week 1): Pt will remain out of bed up to 2 hours at a time.  PT Short Term Goal 4 (Week 1): Pt will perform all bed mobility with min assist.   Skilled Therapeutic Interventions/Progress Updates:    Pt received seated in bed, agreeable with encouragement to participate in therapy session. Education with patient about purpose of therapy to improve endurance, tolerance, strength etc. as pt perseverates on the fact that she already got up once with therapy this AM. Pt agreeable with education to get up to side of bed. Supine to sit with Supervision with increased time needed to complete as pt is easily distracted. Pt redirected to focus on therapy tasks. Sitting balance EOB with close SBA. SpO2 94% and higher throughout session while on 4L O2. Sit to stand with min A x 2 to stedy. Pt tolerates standing 2 x 2 min in stedy before onset of fatigue. RT comes in during therapy session and reports that due to pt's ABG values they will need pt to return to bed and be put on Bipap machine. Sit to supine min A for RLE control. Pt left semi-reclined in bed in care of RT. Pt missed 25 min of scheduled skilled therapy session due to lab values.  Therapy Documentation Precautions:  Precautions Precautions: Fall Precaution Comments: Per chart, 4L 02 at baseline Restrictions Weight Bearing Restrictions: No General: PT Amount of Missed Time (min): 25 Minutes PT Missed Treatment Reason: Other (Comment)(hold for RT care due to ABGs) Pain: Pain Assessment Pain Scale:  0-10 Pain Score: 0-No pain    Therapy/Group: Individual Therapy   Excell Seltzer, PT, DPT  06/12/2018, 11:48 AM

## 2018-06-13 ENCOUNTER — Inpatient Hospital Stay (HOSPITAL_COMMUNITY): Payer: Self-pay | Admitting: Physical Therapy

## 2018-06-13 ENCOUNTER — Inpatient Hospital Stay (HOSPITAL_COMMUNITY): Payer: Self-pay

## 2018-06-13 LAB — BASIC METABOLIC PANEL
BUN: 19 mg/dL (ref 6–20)
CO2: >50 mmol/L — ABNORMAL HIGH (ref 22–32)
Calcium: 9.3 mg/dL (ref 8.9–10.3)
Chloride: 82 mmol/L — ABNORMAL LOW (ref 98–111)
Creatinine, Ser: 1.11 mg/dL — ABNORMAL HIGH (ref 0.44–1.00)
GFR calc Af Amer: >60 mL/min (ref >60)
GFR calc non Af Amer: 55 mL/min — ABNORMAL LOW (ref >60)
Glucose, Bld: 117 mg/dL — ABNORMAL HIGH (ref 70–99)
Potassium: 3.2 mmol/L — ABNORMAL LOW (ref 3.5–5.1)
Sodium: 140 mmol/L (ref 135–145)

## 2018-06-13 LAB — GLUCOSE, CAPILLARY
Glucose-Capillary: 109 mg/dL — ABNORMAL HIGH (ref 70–99)
Glucose-Capillary: 163 mg/dL — ABNORMAL HIGH (ref 70–99)
Glucose-Capillary: 168 mg/dL — ABNORMAL HIGH (ref 70–99)
Glucose-Capillary: 142 mg/dL — ABNORMAL HIGH (ref 70–99)

## 2018-06-13 LAB — BLOOD GAS, ARTERIAL
Acid-Base Excess: 26.5 mmol/L — ABNORMAL HIGH (ref 0.0–2.0)
Bicarbonate: 53.3 mmol/L — ABNORMAL HIGH (ref 20.0–28.0)
Delivery systems: POSITIVE
Drawn by: 347191
Expiratory PAP: 5
FIO2: 0.35
Inspiratory PAP: 18
O2 Saturation: 87.5 %
Patient temperature: 98.6
RATE: 18 resp/min
pCO2 arterial: 86.3 mmHg (ref 32.0–48.0)
pH, Arterial: 7.408 (ref 7.350–7.450)
pO2, Arterial: 55.5 mmHg — ABNORMAL LOW (ref 83.0–108.0)

## 2018-06-13 MED ORDER — ACETAZOLAMIDE SODIUM 500 MG IJ SOLR
500.0000 mg | Freq: Once | INTRAMUSCULAR | Status: AC
  Administered 2018-06-13: 500 mg via INTRAVENOUS
  Filled 2018-06-13: qty 500

## 2018-06-13 MED ORDER — ACETAZOLAMIDE SODIUM 500 MG IJ SOLR
500.0000 mg | Freq: Once | INTRAMUSCULAR | Status: DC
  Filled 2018-06-13: qty 500

## 2018-06-13 MED ORDER — SORBITOL 70 % SOLN
60.0000 mL | Status: AC
  Administered 2018-06-13: 60 mL via ORAL
  Filled 2018-06-13: qty 60

## 2018-06-13 MED ORDER — POTASSIUM CHLORIDE 20 MEQ/15ML (10%) PO SOLN: 40.0000 meq | Freq: Two times a day (BID) | ORAL | Status: DC

## 2018-06-13 MED ORDER — POTASSIUM CHLORIDE 20 MEQ/15ML (10%) PO SOLN
40.0000 meq | Freq: Two times a day (BID) | ORAL | Status: AC
  Administered 2018-06-13 – 2018-06-14 (×2): 40 meq via ORAL
  Filled 2018-06-13 (×2): qty 30

## 2018-06-13 MED ORDER — FLEET ENEMA 7-19 GM/118ML RE ENEM: 1.0000 | ENEMA | Freq: Every day | RECTAL | Status: DC | PRN

## 2018-06-13 NOTE — Progress Notes (Signed)
NAME:  Canaan Prue, MRN:  [id number], DOB:  Nov 26, 1960, LOS: 4 ADMISSION DATE:  06/09/2018, CONSULTATION DATE:  1/13 REFERRING MD:  Naaman Plummer, CHIEF COMPLAINT:  Hypercarbic respiratory failure    Brief History   This is an obese 58 year old female with a history of COPD, chronic respiratory failure on 4 L at all times also has a history of severe sleep apnea and obesity hypoventilation. She has been rehabilitating in inpatient rehab since 1/10 status post sub-acute CVA.  Been treated with aggressive diuresis for volume overload, has been intermittently compliant with positive pressure therapy since her initial admission on 12/30.  Pulmonary asked to see in consultation on 1/13 for progressive hypercarbic respiratory failure.  History of present illness    Is a 58 year old African-American female whom we initially saw on consult on 12/30 For acute on chronic hypoxic and hypercarbic respiratory failure which we felt was primarily secondary to pulmonary edema and decompensated OHS OSA.  She had initially been admitted for chief complaint of weakness, worsening lightheadedness, and decreased activity Tolerance.  There is no chest pain shortness of breath palpitations presyncopal noted.  Initially had a hemoglobin of 5.7 and was transfused for this.  On 12/21 she had a rapid response called.  She was found to have acute frothy blood-tinged sputum, worsening shortness of breath, PCO2 of 90.80 and a pH of 7.24 her serum bicarbonate at that time was 37.5 calculated.  Pulmonary was asked to see for acute on chronic hypercarbic respiratory failure.  We felt this primarily pressure was the result of pulmonary edema, as well as acute decompensated biventricular heart failure.  She improved with BiPAP and diuretic therapy. During her hospital stay she was slowly progressing unfortunately she had an acute mental status change on 1/5 code stroke was activated.  She was once again found to be hypercarbic and was  restarted on BiPAP therapy.  She was noted prior to that event that she was not using prescribed positive pressure at that point. A CT of head was obtained, this did show concern for recent right hemisphere stroke, timing was unclear as to when.  She subsequently suffered a seizure on 1/6 and was unable to protect her airway following that she required intubation.  Critical care had been reconsulted at that time to assist with ventilator management.  She was ultimately extubated, slowly progressed, and eventually transferred to inpatient rehab on 1/10.  Since her time in rehab the medical team headache aggressively been adjusting her diuretics due to pulmonary edema, they noted progressive metabolic alkalosis, and follow-up ABG was obtained today on 1/13 at which time she was found to have significant respiratory acidosis with a PCO2 of 100.  Pulmonary was asked to see due to her worsening hypercarbia Past Medical History  Gold 3 COPD, Chronic respiratory failure, severe sleep apnea with noncompliance with CPAP.  Recent stroke. Significant Hospital Events     Consults:   PCCM  Procedures:    Significant Diagnostic Tests:   Micro Data:    Antimicrobials:    Interim history/subjective:  Off BiPAP at time of exam and on 4LNC  Objective   Blood pressure (!) 155/85, pulse 79, temperature 97.7 F (36.5 C), temperature source Axillary, resp. rate 20, height 5' 7.5" (1.715 m), weight (!) 138.3 kg, last menstrual period 07/28/2013, SpO2 96 %.        Intake/Output Summary (Last 24 hours) at 06/13/2018 1134 Last data filed at 06/13/2018 0830 Gross per 24 hour  Intake 720 ml  Output -  Net 720 ml   Filed Weights   06/11/18 0438 06/12/18 0506 06/13/18 0314  Weight: (!) 139.6 kg (!) 138.9 kg (!) 138.3 kg    Examination: General: Morbidly obese adult female. NAD HENT: [place]/AT. Moist mucus membranes. Sclera anicteric  Lungs: CTA. Diminished bibasilar breath sounds.  Cardiovascular: RRR, no  r/g/m appreciated Abdomen: Obese, soft, non-tender.  Extremities: No obvious joint deformity. Mild non-pitting pedal edema.  Neuro: Awake, alert.  Following commands.  GU: Voids per self.   Resolved Hospital Problem list    Assessment & Plan:   Acute on chronic hypercarbic respiratory failure -Severe OHS, OSA, severe deconditioning, COPD gold grade 3. -Suspected contraction alkalosis, s/p Diamox x 1 on 1/13 Plan/rec Repeat ABG shows hypercarbia with some improvement following 1x Diamox. Recommend continuing BiPAP during naps and at night and [place] during day to meet SpO2 goal >88% Patient is protecting airway, does not require intubation at this time. If patient fails to protect airway, would likely require tracheostomy and nocturnal ventilation    Labs   CBC: Recent Labs  Lab 06/08/18 0322 06/10/18 0544 06/12/18 0720  WBC 8.1 7.4 8.0  NEUTROABS  --  5.2  --   HGB 7.9* 7.9* 8.1*  HCT 31.0* 32.2* 31.6*  MCV 83.6 84.3 84.7  PLT 349 286 748    Basic Metabolic Panel: Recent Labs  Lab 06/08/18 0322 06/10/18 0544 06/12/18 0720 06/13/18 0528  NA 139 141 141 140  K 3.7 3.9 3.4* 3.2*  CL 92* 89* 83* 82*  CO2 41* 45* >50* >50*  GLUCOSE 195* 87 96 117*  BUN 22* 21* 19 19  CREATININE 1.00 0.92 1.06* 1.11*  CALCIUM 8.2* 8.9 9.1 9.3   GFR: Estimated Creatinine Clearance: 81.1 mL/min (A) (by C-G formula based on SCr of 1.11 mg/dL (H)). Recent Labs  Lab 06/08/18 0322 06/10/18 0544 06/12/18 0720  WBC 8.1 7.4 8.0    Liver Function Tests: Recent Labs  Lab 06/08/18 0322 06/10/18 0544  AST 21 23  ALT 13 15  ALKPHOS 80 79  BILITOT 1.1 1.1  PROT 8.5* 8.8*  ALBUMIN 2.5* 2.6*   No results for input(s): LIPASE, AMYLASE in the last 168 hours. Recent Labs  Lab 06/10/18 0544  AMMONIA 48*    ABG    Component Value Date/Time   PHART 7.408 06/13/2018 0405   PCO2ART 86.3 (HH) 06/13/2018 0405   PO2ART 55.5 (L) 06/13/2018 0405   HCO3 53.3 (H) 06/13/2018 0405   TCO2  >50 (H) 06/06/2018 0906   O2SAT 87.5 06/13/2018 0405     Coagulation Profile: No results for input(s): INR, PROTIME in the last 168 hours.  Cardiac Enzymes: No results for input(s): CKTOTAL, CKMB, CKMBINDEX, TROPONINI in the last 168 hours.  HbA1C: Hemoglobin A1C  Date/Time Value Ref Range Status  01/23/2018 02:46 PM 8.0 (A) 4.0 - 5.6 % Final  10/06/2016 04:43 PM 10.0  Final   HbA1c, POC (prediabetic range)  Date/Time Value Ref Range Status  01/23/2018 02:46 PM 8 (A) 5.7 - 6.4 % Final   HbA1c, POC (controlled diabetic range)  Date/Time Value Ref Range Status  01/23/2018 02:46 PM 8.0 (A) 0.0 - 7.0 % Final   HbA1c POC (<> result, manual entry)  Date/Time Value Ref Range Status  01/23/2018 02:46 PM 8 4.0 - 5.6 % Final   Hgb A1c MFr Bld  Date/Time Value Ref Range Status  06/01/2018 10:24 AM 7.1 (H) 4.8 - 5.6 % Final    Comment:    (NOTE) Pre diabetes:  5.7%-6.4% Diabetes:              >6.4% Glycemic control for   <7.0% adults with diabetes   05/21/2015 05:26 AM 7.9 (H) 4.8 - 5.6 % Final    Comment:    (NOTE)         Pre-diabetes: 5.7 - 6.4         Diabetes: >6.4         Glycemic control for adults with diabetes: <7.0     CBG: Recent Labs  Lab 06/12/18 0642 06/12/18 1136 06/12/18 1620 06/12/18 2115 06/13/18 0639  GLUCAP 94 136* 129* 124* 109*      Critical care time: na    Eliseo Gum MSN, AGACNP-BC [place] 06/13/2018, 11:34 AM

## 2018-06-13 NOTE — Progress Notes (Signed)
Physical Therapy Session Note  Patient Details  Name: Tamara Silva MRN: [id number] Date of Birth: 06-Apr-1961  Today's Date: 06/13/2018 PT Individual Time: 0900-1000; 1300-1330 PT Individual Time Calculation (min): 60 min and 30 min  Short Term Goals: Week 1:  PT Short Term Goal 1 (Week 1): Pt will perform bed<>WC transfer with mod assist  PT Short Term Goal 2 (Week 1): Pt will consistently participate in therapy with minimal encouragement.  PT Short Term Goal 3 (Week 1): Pt will remain out of bed up to 2 hours at a time.  PT Short Term Goal 4 (Week 1): Pt will perform all bed mobility with min assist.   Skilled Therapeutic Interventions/Progress Updates:    Session 1: Pt received seated in bed, agreeable to PT. No complaints of pain. Supine to sit with Supervision with HOB elevated. Sit to stand with mod A to stedy. Stedy transfer bed to w/c. Education with patient on importance of spending time OOB for respiratory musculature, endurance, etc. Pt keeps requesting to go back to bed, pt agreeable to transfer to recliner to stay up for next session. Sit to stand max A x 2 from w/c height to stedy. Stedy transfer w/c to recliner. Pt left seated in recliner with needs in reach at end of therapy session. Pt on 4L O2 throughout therapy session, SpO2 93% and above. Pt more engaged in therapy session this date and requires less cueing to attend to therapy tasks.  Session 2: Pt received seated in bed, agreeable to PT. No complaints of pain. Pt and RN report ongoing loose stools since this AM. Pt has had incontinence of bowel in her brief. Rolling L/R with min A and use of bedrails for dependent pericare. Pt continues to have loose stool throughout session. Pt left seated in bed with bedpan in place. Education with patient and her family to call for assist once done using bedpan. Pt left seated in bed with needs in reach and daughter and husband present.   Therapy Documentation Precautions:   Precautions Precautions: Fall Precaution Comments: Per chart, 4L 02 at baseline Restrictions Weight Bearing Restrictions: No Pain: Pain Assessment Pain Scale: 0-10 Pain Score: 0-No pain    Therapy/Group: Individual Therapy   Excell Seltzer, PT, DPT  06/13/2018, 12:01 PM

## 2018-06-13 NOTE — Progress Notes (Signed)
Occupational Therapy Note  Patient Details  Name: Tamara Silva MRN: [id number] Date of Birth: March 31, 1961  Today's Date: 06/13/2018 OT Missed Time: 70 Minutes Missed Time Reason: Nursing care;Other (comment)(RN requested to hold therapy as pt is having severe diarrhea)  60 min of skilled OT missed. RN requested to hold therapy d/t pt having severe frequent diarrhea   Curtis Sites 06/13/2018, 11:27 AM

## 2018-06-13 NOTE — Progress Notes (Signed)
Social Work  Assessment and Plan  Patient Details  Name: Tamara Silva MRN: [id number] Date of Birth: 05/18/1961  Today's Date: 06/13/2018  Problem List:  Patient Active Problem List   Diagnosis Date Noted  . Labile blood pressure   . Wheezing   . Hyperammonemia ([place])   . Acute on chronic diastolic CHF (congestive heart failure) ([place])   . Supplemental oxygen dependent   . Slow transit constipation   . Encephalopathy 06/09/2018  . Cocaine abuse ([place])   . Diabetes mellitus type 2 in obese ([place])   . Generalized tonic-clonic seizure ([place]) 06/05/2018  . Cerebral embolism with cerebral infarction 06/01/2018  . AKI (acute kidney injury) ([place]) 05/30/2018  . Pressure injury of skin 05/30/2018  . Acute on chronic anemia 05/29/2018  . Chronic respiratory failure with hypoxia, on home O2 therapy ([place]) 01/27/2018  . Iron deficiency anemia 01/27/2018  . Tobacco abuse disorder 10/03/2017  . Dyslipidemia 04/29/2016  . Primary insomnia 04/29/2016  . Hypoxemia 09/08/2015  . Congestive heart failure (CHF) ([place]) 09/08/2015  . Obesity hypoventilation syndrome ([place])   . Microcytic anemia   . Acute encephalopathy   . Uncontrolled type 2 diabetes mellitus with hyperosmolarity without coma ([place])   . Acute on chronic respiratory failure with hypercapnia ([place])   . CAP (community acquired pneumonia)   . OSA (obstructive sleep apnea)   . Acute and chr resp failure, unsp w hypoxia or hypercapnia ([place]) 06/22/2015  . Acute on chronic combined systolic (congestive) and diastolic (congestive) heart failure ([place]) 06/21/2015  . Hypoglycemia 06/21/2015  . Uncontrolled type 2 diabetes mellitus without complication, with long-term current use of insulin ([place])   . Dyslipidemia associated with type 2 diabetes mellitus ([place]) 05/21/2015  . Anemia of chronic kidney failure 05/21/2015  . Hyperkalemia 05/21/2015  . COPD GOLD III  06/10/2014  . Acute respiratory failure with hypoxia ([place]) 03/04/2014  . CKD (chronic  kidney disease), stage II 02/07/2014  . COPD exacerbation ([place]) 02/01/2014  . CAD S/P percutaneous coronary angioplasty - prior PCI to LAD; RCA PCI: new Xience Alpine DES 2.75 mm x 15 mm  10/07/2013  . Morbid obesity ([place]) 07/30/2013  . Chronic combined systolic and diastolic heart failure ([place]) 07/10/2013  . Chronic pulmonary embolism ([place]) 02/08/2013  . Bipolar disorder ([place]) 02/08/2013  . Essential hypertension 02/08/2013  . Chronic anticoagulation 11/27/2012  . Edema extremities 10/15/2012  . COPD (chronic obstructive pulmonary disease) ([place]) 10/15/2012  . Pulmonary embolism on right ([place]) 10/15/2012  . Hypertension 10/15/2012  . Insulin dependent diabetes mellitus ([place]) 10/15/2012   Past Medical History:  Past Medical History:  Diagnosis Date  . Asthma   . CAD (coronary artery disease)   . Chronic combined systolic (congestive) and diastolic (congestive) heart failure ([place])   . Chronic iron deficiency anemia   . Diabetes mellitus without complication ([place])   . DOE (dyspnea on exertion)   . Edema of both legs   . Hypertension   . Pulmonary emboli ([place]) 2014   Past Surgical History:  Past Surgical History:  Procedure Laterality Date  . COLONOSCOPY WITH PROPOFOL N/A 08/03/2013   Procedure: COLONOSCOPY WITH PROPOFOL;  Surgeon: Jeryl Columbia, MD;  Location: [facility] ENDOSCOPY;  Service: Endoscopy;  Laterality: N/A;  . CORONARY ANGIOPLASTY WITH STENT PLACEMENT     CAD in 2006 x 2 and 2009 2 more- place din REx in [place] and [place] med  . CORONARY ANGIOPLASTY WITH STENT PLACEMENT  10/07/2013   Xience Alpine DES 2.75  mm x  15  mm  . ESOPHAGOGASTRODUODENOSCOPY (EGD) WITH PROPOFOL N/A 08/03/2013   Procedure: ESOPHAGOGASTRODUODENOSCOPY (EGD) WITH PROPOFOL;  Surgeon: Jeryl Columbia, MD;  Location: [facility] ENDOSCOPY;  Service: Endoscopy;  Laterality: N/A;  . LEFT HEART CATHETERIZATION WITH CORONARY ANGIOGRAM N/A 10/07/2013   Procedure: LEFT HEART CATHETERIZATION WITH CORONARY ANGIOGRAM;  Surgeon: Leonie Man, MD;  Location: [facility] C[facility] LAB;  Service: Cardiovascular;  Laterality: N/A;   Social History:  reports that she quit smoking about 4 years ago. Her smoking use included cigarettes. She has a 10.00 pack-year smoking history. She has never used smokeless tobacco. She reports that she does not drink alcohol or use drugs.  Family / Support Systems Marital Status: Single Patient Roles: Parent, Production manager, Other (Comment)(grandmother) Spouse/Significant Other: [name] Children: Hazle Quant - dtr - [phone] (h); [phone] (m) Other Supports: Adron Bene - sister - [phone] Anticipated Caregiver: [name] Ability/Limitations of Caregiver: Per pt, [name] has no physical limitations; dtr works, but helps when she can and drives pt to appts after work Building control surveyor Availability: 24/7 Family Dynamics: Per pt, [name] and [place] are supportive.  Social History Preferred language: English Religion: Pentecostal Read: Yes Write: Yes Employment Status: Disabled Public relations account executive Issues: none reported Guardian/Conservator: Per Dr. Naaman Plummer, pt is not fully capable of making her own decisions.  Dtr would be next of kin to assist with decisions, as needed.   Abuse/Neglect Abuse/Neglect Assessment Can Be Completed: Yes Physical Abuse: Denies Verbal Abuse: Denies Sexual Abuse: Denies Exploitation of patient/patient's resources: Denies Self-Neglect: Denies  Emotional Status Pt's affect, behavior and adjustment status: Pt was laughing with CSW and in good spirits during assessment visit.  She does not endorse any emotional difficulties currently. Recent Psychosocial Issues: none reported Psychiatric History: none reported Substance Abuse History: Pt reported to CSW that her substance use was in the past, however pt was positive for cocaine on this admission.  CSW stressed the importance of no alcohol or drug use given her comorbidities.   Patient / Family Perceptions, Expectations  & Goals Pt/Family understanding of illness & functional limitations: Pt reported a fair understanding of her condition. Premorbid pt/family roles/activities: Pt reports that she pays her bills and watches TV. Anticipated changes in roles/activities/participation: Pt hopes to resume the above activities as she is able. Pt/family expectations/goals: Pt wants "to get better and get out of our hair."  [employer]: Other (Comment)([place] DSS) Premorbid Home Care/DME Agencies: Other (Comment)([employer] for oxygen) Transportation available at discharge: family Resource referrals recommended: Neuropsychology  Discharge Planning Living Arrangements: Spouse/significant other Support Systems: Spouse/significant other, Children, Other relatives Type of Residence: Private residence Insurance Resources: Medicaid (specify county)([place]) Financial Resources: [employer] Financial Screen Referred: No Living Expenses: Rent Money Management: Patient Does the patient have any problems obtaining your medications?: No Home Management: Significant other and dtr take care of the home. Patient/Family Preliminary Plans: Pt plans to return home with her significant other, [name], to assist as he was PTA.  Dtr will assist with transportation to appts. Social Work Anticipated Follow Up Needs: HH/OP Expected length of stay: 2 to 3 weeks  Clinical Impression CSW met with pt to introduce self and role of CSW, as well as to complete assessment.  Pt was not able to fully answer all of CSW's questions, but she gave permission for CSW to talk with her dtr.  Pt was somewhat confused at times and had trouble finding her words.  She reports feeling tired and needing rest.  CSW tried  to get pt to talk about what she normally does in a day, but all she could report was pay bills.  CSW will talk with dtr about more details, but dtr shared with nurse, Margaretha Sheffield, that pt was pretty sedentary and dtr or  [name] did almost everything for pt.  Pt reports feeling okay emotionally and laughed at times, but CSW will continue to monitor her for depression.  She reports a faith/trust in God that helps her through difficult situations and that he must want her to be here still for some purpose.  No current concerns/questions/needs.  CSW will continue to follow, call dtr, and assist as needed.  Christin Mccreedy, Silvestre Mesi 06/13/2018, 1:23 PM

## 2018-06-13 NOTE — Progress Notes (Signed)
[facility] Individual Statement of Services  Patient Name:  Tamara Silva  Date:  06/13/2018  Welcome to the [facility].  Our goal is to provide you with an individualized program based on your diagnosis and situation, designed to meet your specific needs.  With this comprehensive rehabilitation program, you will be expected to participate in at least 3 hours of rehabilitation therapies Monday-Friday, with modified therapy programming on the weekends.  Your rehabilitation program will include the following services:  Physical Therapy (PT), Occupational Therapy (OT), Speech Therapy (ST), 24 hour per day rehabilitation nursing, Neuropsychology, Case Management (Social Worker), Rehabilitation Medicine, Nutrition Services and Pharmacy Services  Weekly team conferences will be held on Tuesdays to discuss your progress.  Your Social Worker will talk with you frequently to get your input and to update you on team discussions.  Team conferences with you and your family in attendance may also be held.  Expected length of stay:  2 to 3 weeks  Overall anticipated outcome:  Minimal to moderate assistance  Depending on your progress and recovery, your program may change. Your Social Worker will coordinate services and will keep you informed of any changes. Your Social Worker's name and contact numbers are listed  below.  The following services may also be recommended but are not provided by the [facility] will be made to provide these services after discharge if needed.  Arrangements include referral to agencies that provide these services.  Your insurance has been verified to be:  Medicaid Your primary doctor is:  Dr. Antony Blackbird  Pertinent information will be shared with your doctor and your insurance company.  Social Worker:   Alfonse Alpers, LCSW  [phone] or [phone]  Information discussed with and copy given to patient by: Trey Sailors, 06/13/2018, 12:47 PM

## 2018-06-13 NOTE — Progress Notes (Signed)
[facility] PHYSICAL MEDICINE & REHABILITATION PROGRESS NOTE  Subjective/Complaints: Pt sleeping soundly when I came in. Slow to arouse. Is wearing BIPAP.   ROS: Limited due to cognitive/behavioral   Objective: Vital Signs: Blood pressure (!) 155/85, pulse 79, temperature 97.7 F (36.5 C), temperature source Axillary, resp. rate 20, height 5' 7.5" (1.715 m), weight (!) 138.3 kg, last menstrual period 07/28/2013, SpO2 96 %. Dg Chest 2 View  Result Date: 06/12/2018 CLINICAL DATA:  Cough, wheezing and shortness of breath. EXAM: CHEST - 2 VIEW COMPARISON:  Single-view of the chest 06/09/2018 and 06/04/2018. PA and lateral chest 07/25/2017. CT chest 06/28/2015. FINDINGS: Cardiomegaly and pulmonary edema are chronic and appear unchanged. Pleural scar and calcification along the left upper chest wall are again identified. No pneumothorax or pleural effusion. No acute bony abnormality. IMPRESSION: Cardiomegaly and chronic mild pulmonary edema. Electronically Signed   By: Inge Rise M.D.   On: 06/12/2018 08:58   Dg Abd 1 View  Result Date: 06/12/2018 CLINICAL DATA:  Patient complains of constipation. EXAM: ABDOMEN - 1 VIEW COMPARISON:  June 05, 2018 FINDINGS: Mild fecal loading in the colon. No bowel obstruction. No free air, portal venous gas, pneumatosis seen on supine imaging. No other acute abnormalities. IMPRESSION: Mild fecal loading in the colon. Electronically Signed   By: Dorise Bullion III M.D   On: 06/12/2018 19:39   Recent Labs    06/12/18 0720  WBC 8.0  HGB 8.1*  HCT 31.6*  PLT 240   Recent Labs    06/12/18 0720 06/13/18 0528  NA 141 140  K 3.4* 3.2*  CL 83* 82*  CO2 >50* >50*  GLUCOSE 96 117*  BUN 19 19  CREATININE 1.06* 1.11*  CALCIUM 9.1 9.3    Physical Exam: BP (!) 155/85 (BP Location: Left Arm)   Pulse 79   Temp 97.7 F (36.5 C) (Axillary)   Resp 20   Ht 5' 7.5" (1.715 m)   Wt (!) 138.3 kg   LMP 07/28/2013 Comment: perimenopausal  SpO2 96%   BMI  47.05 kg/m  Constitutional: No distress . Vital signs reviewed. obese HEENT: EOMI, oral membranes moist Neck: supple Cardiovascular: RRR without murmur. No JVD    Respiratory: even with BIPAP seems to have obstructed airway, noisy breathing. No obvious distress  GI: BS +, non-tender, non-distended  Musculoskeletal: Edemapresent in bilateral lower extremities.  Neurological:     Slow to arouse today Motor: moves all 4's Skin: Skin iswarm. Skin tears under breasts and dry flaky skin on abdomen and BLE---no change Healed lesions on extremities. Psychiatric:  lethargic  Assessment/Plan: 1. Functional deficits secondary to debility which require 3+ hours per day of interdisciplinary therapy in a comprehensive inpatient rehab setting.  Physiatrist is providing close team supervision and 24 hour management of active medical problems listed below.  Physiatrist and rehab team continue to assess barriers to discharge/monitor patient progress toward functional and medical goals  Care Tool:  Bathing    Body parts bathed by patient: Face   Body parts bathed by helper: Right arm, Left arm, Chest, Abdomen, Front perineal area, Buttocks, Right upper leg, Left upper leg, Right lower leg, Left lower leg     Bathing assist Assist Level: Maximal Assistance - Patient 24 - 49%     Upper Body Dressing/Undressing Upper body dressing   What is the patient wearing?: Hospital gown only    Upper body assist Assist Level: Maximal Assistance - Patient 25 - 49%    Lower Body Dressing/Undressing  Lower body dressing      What is the patient wearing?: Pants, Incontinence brief     Lower body assist Assist for lower body dressing: Maximal Assistance - Patient 25 - 49%     Toileting Toileting Toileting Activity did not occur Landscape architect and hygiene only): (Incontinent)  Toileting assist Assist for toileting: 2 Helpers     Transfers Chair/bed transfer  Transfers assist   Chair/bed transfer activity did not occur: Safety/medical concerns  Chair/bed transfer assist level: Dependent - Patient 0%     Locomotion Ambulation   Ambulation assist   Ambulation activity did not occur: Safety/medical concerns          Walk 10 feet activity   Assist  Walk 10 feet activity did not occur: Refused        Walk 50 feet activity   Assist Walk 50 feet with 2 turns activity did not occur: Safety/medical concerns         Walk 150 feet activity   Assist Walk 150 feet activity did not occur: Safety/medical concerns         Walk 10 feet on uneven surface  activity   Assist Walk 10 feet on uneven surfaces activity did not occur: Safety/medical concerns         Wheelchair     Assist Will patient use wheelchair at discharge?: Yes Type of Wheelchair: Manual Wheelchair activity did not occur: Safety/medical concerns         Wheelchair 50 feet with 2 turns activity    Assist    Wheelchair 50 feet with 2 turns activity did not occur: Safety/medical concerns       Wheelchair 150 feet activity     Assist Wheelchair 150 feet activity did not occur: Safety/medical concerns          Medical Problem List and Plan: 1.Functional deficitssecondary to metabolic encephalopathy, right occipital-parietal infact and debility after multiple medical issues  -Continue CIR therapies including PT, OT, and SLP as tolerated   -Interdisciplinary Team Conference today   2. DVT Prophylaxis/Anticoagulation: Pharmaceutical:Lovenox 3. Pain Management:Tylenol as needed 4. Mood:LCSW to follow for evaluation and support 5. Neuropsych: This patientis not fullycapable of making decisions on hisown behalf. 6. Skin/Wound Care:Routine pressure relief measures 7. Fluids/Electrolytes/Nutrition:Monitor I/O.   I personally reviewed the patient's labs today.   8. COPD with chronic hypoxic respiratory failure: oxygen dependent--uses 4 L at  baseline.  -repeat ABG with pCO2 86 (sl improvement), bicarb about the same  -BiPAP  -appreciate pulmonary assistance   -diamox to help with bicarb per pulmonary 9. 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  Added nebs prn. 10. Acute on chronic CHF: Low-salt diet. On Coreg, statin. May need LFTs checked every 2 months.   Chest x-ray on 1/10 with congestion---repeat cxr 1/13 doesn't look much different  Torsemide changed to diamox--received 500mg  iv yesterday  -ABG Filed Weights   06/11/18 0438 06/12/18 0506 06/13/18 0314  Weight: (!) 139.6 kg (!) 138.9 kg (!) 138.3 kg   Weights trending down slightly    11. Acute on chronic anemia: Improved post transfusion. Now back on ASA--monitor with serial checks.   Hemoglobin 8.1 on 1/13---recheck tomorrow    12.Elevated ammonia levels:    Level of 48 on 1/11  Lactulose started 1/12---recheck tomorrow 13.Multifactorial encephalopathy: appears to be improving with rx of ammonia and CHF  -continue to monitor clinically 14. T2DM: Monitor BS ac/hs. Continue Levemir with SSI. Intake variable --will continue to  titrate as indicated.  Relatively controlled on 1/13  No changes today 15.  Morbid obesity  Encourage weight loss 16.  Constipation  Bowel regimen increased on 1/11, increased again on 1/12-lactulose added  -still no bm  -KUB with scattered stool.   -sorbitol today, fleet enema if needed. 17.  Labile blood pressure  Fair control at present  LOS: 4 days A FACE TO FACE EVALUATION WAS PERFORMED  Meredith Staggers 06/13/2018, 8:31 AM

## 2018-06-13 NOTE — Progress Notes (Signed)
This RN was notified about patient PC02 of 86.3. On-call notified, no new order given. We continue to monitor

## 2018-06-13 NOTE — Progress Notes (Signed)
Speech Language Pathology Daily Session Note  Patient Details  Name: Kaitlen Redford MRN: [id number] Date of Birth: [birth year]  Today's Date: 06/13/2018 SLP Individual Time: 0802-0900 SLP Individual Time Calculation (min): 58 min  Short Term Goals: Week 1: SLP Short Term Goal 1 (Week 1): Pt will demonstrate sustained attention to functional task in 5 minute intervals with min A verbal cues to redirect. SLP Short Term Goal 1 - Progress (Week 1): Updated due to goal met SLP Short Term Goal 2 (Week 1): Pt will demonstrate basic and functional problem solving skills in functional tasks with mod A verbal cues.  SLP Short Term Goal 2 - Progress (Week 1): Updated due to goal met SLP Short Term Goal 3 (Week 1): Pt will self-monitor functional errors during functional tasks with mod A verbal cues.  SLP Short Term Goal 3 - Progress (Week 1): Updated due to goal met SLP Short Term Goal 4 (Week 1): Pt will demonstrate recall of daily information with min A verbal cues to utilizie external aid.  SLP Short Term Goal 4 - Progress (Week 1): Updated due to goal met SLP Short Term Goal 5 (Week 1): Pt will consume trials of regular textured foods with appropriate oral clearance and min overt s/s aspiration over 3 sessions piror to solid upgrade. SLP Short Term Goal 5 - Progress (Week 1): Met  Skilled Therapeutic Interventions:Skilled ST services focused on swallow and cognitive skills. Pt initially appeared upset by SLP questioning or request for participation stating " you need to slow down. Don't start with all this today." SLP communicated in a sloothing voice and slowed questions/request to simple commands. Pt demonstrated increase in cognitive ability compared to pervious sessions, recalling ST events from yesterday with mod A verbal cues, stated wants/needs (requesting to change brief prior to PO consumption when given the two options), followed basic 1 step commands ambulating in bed to change brief. Pt  continued to state that she managed personal medication and kept track of appointments, however stated that her "mind has not changes" since acute hospitalization. SLP facilitated basic problem solving, oral care, applying upper dentures with adhesives and navigating breakfast tray pt required mod-min A verbal cues. SLP facilitated trials of regular textured foods pt demonstrated no overt s/s aspiration and appropriate oral clearance. SLP upgraded diet to regular textured foods (carb moditified) and intermittment supervision due to improved mentation. SLP upgraded STG and LTG, in hopes that improved cognition will remain consistent. Pt was left in room with call bell within reach and bed alarm set. SLP reccomends to continue skilled services.     Pain Pain Assessment Pain Score: 0-No pain  Therapy/Group: Individual Therapy  Lyrique Hakim  [facility] 06/13/2018, 1:54 PM

## 2018-06-14 ENCOUNTER — Inpatient Hospital Stay (HOSPITAL_COMMUNITY): Payer: Self-pay | Admitting: Physical Therapy

## 2018-06-14 ENCOUNTER — Inpatient Hospital Stay (HOSPITAL_COMMUNITY): Payer: Self-pay | Admitting: Occupational Therapy

## 2018-06-14 ENCOUNTER — Inpatient Hospital Stay (HOSPITAL_COMMUNITY): Payer: Self-pay

## 2018-06-14 DIAGNOSIS — J9611 Chronic respiratory failure with hypoxia: Secondary | ICD-10-CM

## 2018-06-14 DIAGNOSIS — G4733 Obstructive sleep apnea (adult) (pediatric): Secondary | ICD-10-CM

## 2018-06-14 DIAGNOSIS — J9612 Chronic respiratory failure with hypercapnia: Secondary | ICD-10-CM

## 2018-06-14 DIAGNOSIS — E662 Morbid (severe) obesity with alveolar hypoventilation: Secondary | ICD-10-CM

## 2018-06-14 DIAGNOSIS — R0902 Hypoxemia: Secondary | ICD-10-CM

## 2018-06-14 LAB — COMPREHENSIVE METABOLIC PANEL
ALT: 14 U/L (ref 0–44)
AST: 23 U/L (ref 15–41)
Albumin: 2.4 g/dL — ABNORMAL LOW (ref 3.5–5.0)
Alkaline Phosphatase: 92 U/L (ref 38–126)
Anion gap: 6 (ref 5–15)
BUN: 17 mg/dL (ref 6–20)
CO2: 47 mmol/L — ABNORMAL HIGH (ref 22–32)
Calcium: 9.2 mg/dL (ref 8.9–10.3)
Chloride: 87 mmol/L — ABNORMAL LOW (ref 98–111)
Creatinine, Ser: 1.09 mg/dL — ABNORMAL HIGH (ref 0.44–1.00)
GFR calc Af Amer: >60 mL/min (ref >60)
GFR calc non Af Amer: 56 mL/min — ABNORMAL LOW (ref >60)
Glucose, Bld: 124 mg/dL — ABNORMAL HIGH (ref 70–99)
Potassium: 3 mmol/L — ABNORMAL LOW (ref 3.5–5.1)
Sodium: 140 mmol/L (ref 135–145)
Total Bilirubin: 1.3 mg/dL — ABNORMAL HIGH (ref 0.3–1.2)
Total Protein: 8.7 g/dL — ABNORMAL HIGH (ref 6.5–8.1)

## 2018-06-14 LAB — BLOOD GAS, ARTERIAL
Acid-Base Excess: 23.4 mmol/L — ABNORMAL HIGH (ref 0.0–2.0)
Bicarbonate: 49.3 mmol/L — ABNORMAL HIGH (ref 20.0–28.0)
Delivery systems: POSITIVE
Drawn by: 347191
Expiratory PAP: 5
Inspiratory PAP: 18
O2 Content: 5 L/min
O2 Saturation: 94.4 %
Patient temperature: 98.6
pCO2 arterial: 70 mmHg (ref 32.0–48.0)
pH, Arterial: 7.462 — ABNORMAL HIGH (ref 7.350–7.450)
pO2, Arterial: 66.1 mmHg — ABNORMAL LOW (ref 83.0–108.0)

## 2018-06-14 LAB — CBC
HEMATOCRIT: 29.4 % — AB (ref 36.0–46.0)
HEMOGLOBIN: 7.9 g/dL — AB (ref 12.0–15.0)
MCH: 22.3 pg — ABNORMAL LOW (ref 26.0–34.0)
MCHC: 26.9 g/dL — ABNORMAL LOW (ref 30.0–36.0)
MCV: 82.8 fL (ref 80.0–100.0)
Platelets: 191 10*3/uL (ref 150–400)
RBC: 3.55 MIL/uL — ABNORMAL LOW (ref 3.87–5.11)
RDW: 33.7 % — ABNORMAL HIGH (ref 11.5–15.5)
WBC: 7.8 10*3/uL (ref 4.0–10.5)
nRBC: 0.8 % — ABNORMAL HIGH (ref 0.0–0.2)

## 2018-06-14 LAB — GLUCOSE, CAPILLARY
Glucose-Capillary: 109 mg/dL — ABNORMAL HIGH (ref 70–99)
Glucose-Capillary: 200 mg/dL — ABNORMAL HIGH (ref 70–99)
Glucose-Capillary: 169 mg/dL — ABNORMAL HIGH (ref 70–99)
Glucose-Capillary: 185 mg/dL — ABNORMAL HIGH (ref 70–99)

## 2018-06-14 LAB — RAPID URINE DRUG SCREEN, HOSP PERFORMED
Amphetamines: NOT DETECTED
Barbiturates: NOT DETECTED
Benzodiazepines: NOT DETECTED
Cocaine: NOT DETECTED
Opiates: NOT DETECTED
Tetrahydrocannabinol: NOT DETECTED

## 2018-06-14 LAB — AMMONIA: Ammonia: 99 umol/L — ABNORMAL HIGH (ref 9–35)

## 2018-06-14 MED ORDER — LACTULOSE 10 GM/15ML PO SOLN
30.0000 g | Freq: Two times a day (BID) | ORAL | Status: DC
  Administered 2018-06-14 – 2018-06-20 (×13): 30 g via ORAL
  Filled 2018-06-14 (×15): qty 45

## 2018-06-14 NOTE — Progress Notes (Signed)
Physical Therapy Note  Patient Details  Name: Tamara Silva MRN: [id number] Date of Birth: 1960/06/04 Today's Date: 06/14/2018    Attempted to see patient for second scheduled AM session. Pt sidelying in bed on bipap machine, pt declines to participate in therapy session. Pt encouraged to participate in bed level therex, pt declines. Pt left supine in bed with needs in reach, will continue to follow up per POC. Pt missed 30 min of scheduled skilled therapy services.    Excell Seltzer, PT, DPT  06/14/2018, 11:47 AM

## 2018-06-14 NOTE — Progress Notes (Signed)
Physical Therapy Session Note  Patient Details  Name: Tamara Silva MRN: [id number] Date of Birth: 1960-08-25  Today's Date: 06/14/2018 PT Individual Time: 0800-0850 PT Individual Time Calculation (min): 50 min   Short Term Goals: Week 1:  PT Short Term Goal 1 (Week 1): Pt will perform bed<>WC transfer with mod assist  PT Short Term Goal 2 (Week 1): Pt will consistently participate in therapy with minimal encouragement.  PT Short Term Goal 3 (Week 1): Pt will remain out of bed up to 2 hours at a time.  PT Short Term Goal 4 (Week 1): Pt will perform all bed mobility with min assist.   Skilled Therapeutic Interventions/Progress Updates:  Pt received seated EOB from OT session, agreeable to PT. No complaints of pain. Sit to stand with CGA to RW x 5 reps. Ambulation fwd/back x 5 ft with RW and min A for balance. Ambulation 2 x 5 ft from bed to w/c with RW and min A. Commode transfer with min A and RW, pt requires assist for clothing management and performs pericare with setup A. RT in room at end of therapy session reporting high pCO2 levels again, requests to hook pt back up to bipap machine. Sit to supine mod A for BLE management. Pt on 4L O2 throughout therapy session. Pt left in care of RT. Pt missed 10 min of therapy session.   Therapy Documentation Precautions:  Precautions Precautions: Fall Precaution Comments: Per chart, 4L 02 at baseline Restrictions Weight Bearing Restrictions: No General: PT Amount of Missed Time (min): 10 Minutes PT Missed Treatment Reason: Other (Comment)(high pCO2, RT in room to hook up to bipap)    Therapy/Group: Individual Therapy  Excell Seltzer, PT, DPT  06/14/2018, 9:00 AM

## 2018-06-14 NOTE — Progress Notes (Addendum)
NAME:  Koralynn Greenspan, MRN:  [id number], DOB:  [birth year], LOS: 5 ADMISSION DATE:  06/09/2018, CONSULTATION DATE:  1/13 REFERRING MD:  Naaman Plummer, CHIEF COMPLAINT:  Hypercarbic respiratory failure    Brief History   This is an obese 58 year old female with a history of COPD, chronic respiratory failure on 4 L at all times also has a history of severe sleep apnea and obesity hypoventilation. She has been rehabilitating in inpatient rehab since 1/10 status post sub-acute CVA.  Been treated with aggressive diuresis for volume overload, has been intermittently compliant with positive pressure therapy since her initial admission on 12/30.  Pulmonary asked to see in consultation on 1/13 for progressive hypercarbic respiratory failure.  History of present illness    Is a 58 year old African-American female whom we initially saw on consult on 12/30 For acute on chronic hypoxic and hypercarbic respiratory failure which we felt was primarily secondary to pulmonary edema and decompensated OHS OSA.  She had initially been admitted for chief complaint of weakness, worsening lightheadedness, and decreased activity Tolerance.  There is no chest pain shortness of breath palpitations presyncopal noted.  Initially had a hemoglobin of 5.7 and was transfused for this.  On 12/21 she had a rapid response called.  She was found to have acute frothy blood-tinged sputum, worsening shortness of breath, PCO2 of 90.80 and a pH of 7.24 her serum bicarbonate at that time was 37.5 calculated.  Pulmonary was asked to see for acute on chronic hypercarbic respiratory failure.  We felt this primarily pressure was the result of pulmonary edema, as well as acute decompensated biventricular heart failure.  She improved with BiPAP and diuretic therapy. During her hospital stay she was slowly progressing unfortunately she had an acute mental status change on 1/5 code stroke was activated.  She was once again found to be hypercarbic and was  restarted on BiPAP therapy.  She was noted prior to that event that she was not using prescribed positive pressure at that point. A CT of head was obtained, this did show concern for recent right hemisphere stroke, timing was unclear as to when.  She subsequently suffered a seizure on 1/6 and was unable to protect her airway following that she required intubation.  Critical care had been reconsulted at that time to assist with ventilator management.  She was ultimately extubated, slowly progressed, and eventually transferred to inpatient rehab on 1/10.  Since her time in rehab the medical team headache aggressively been adjusting her diuretics due to pulmonary edema, they noted progressive metabolic alkalosis, and follow-up ABG was obtained today on 1/13 at which time she was found to have significant respiratory acidosis with a PCO2 of 100.  Pulmonary was asked to see due to her worsening hypercarbia Past Medical History  Gold 3 COPD, Chronic respiratory failure, severe sleep apnea with noncompliance with CPAP.  Recent stroke. Significant Hospital Events     Consults:   PCCM  Procedures:    Significant Diagnostic Tests:   Micro Data:    Antimicrobials:    Interim history/subjective:  Currently on BiPAP for now.  Objective   Blood pressure (!) 109/50, pulse 65, temperature 97.7 F (36.5 C), temperature source Oral, resp. rate 18, height 5' 7.5" (1.715 m), weight 132.7 kg, last menstrual period 07/28/2013, SpO2 98 %.    FiO2 (%):  [28 %] 28 %   Intake/Output Summary (Last 24 hours) at 06/14/2018 1011 Last data filed at 06/14/2018 0659 Gross per 24 hour  Intake 558 ml  Output -  Net 558 ml   Filed Weights   06/12/18 0506 06/13/18 0314 06/14/18 0402  Weight: (!) 138.9 kg (!) 138.3 kg 132.7 kg    Examination: General: Obese female currently on noninvasive mechanical ventilatory support but awake and alert HEENT: MM pink/moist Neuro: Follows commands moves all extremities on  command CV: s1s2 rrr, no m/r/g PULM: even/non-labored, lungs bilaterally decreased air movement GI: Distended, nontender, no active fluid wave Extremities: warm/dry, 1+ edema  Skin: no rashes or lesions   Resolved Hospital Problem list    Assessment & Plan:   Acute on chronic hypercarbic respiratory failure -Severe OHS, OSA, severe deconditioning, COPD gold grade 3. -Suspected contraction alkalosis, s/p Diamox x 1 on 1/13 -Status post stroke right MCA territory. Plan/rec Continue BiPAP as needed nocturnally and with now O2 saturation goal 88 to 90% Does not need intubation The next logical step if she continues to have respiratory failure and worsens would be tracheostomy.  Along with nocturnal ventilatory support. History of cocaine abuse.  Will check UDS to confirm she is not using cocaine while in the hospital. Note her ammonia level continues to climb but she is awake and alert. Little for pulmonary critical care to offer at this time we will sign off please call us back if needed    Labs   CBC: Recent Labs  Lab 06/08/18 0322 06/10/18 0544 06/12/18 0720 06/14/18 0533  WBC 8.1 7.4 8.0 7.8  NEUTROABS  --  5.2  --   --   HGB 7.9* 7.9* 8.1* 7.9*  HCT 31.0* 32.2* 31.6* 29.4*  MCV 83.6 84.3 84.7 82.8  PLT 349 286 240 416    Basic Metabolic Panel: Recent Labs  Lab 06/08/18 0322 06/10/18 0544 06/12/18 0720 06/13/18 0528 06/14/18 0533  NA 139 141 141 140 140  K 3.7 3.9 3.4* 3.2* 3.0*  CL 92* 89* 83* 82* 87*  CO2 41* 45* >50* >50* 47*  GLUCOSE 195* 87 96 117* 124*  BUN 22* 21* 19 19 17   CREATININE 1.00 0.92 1.06* 1.11* 1.09*  CALCIUM 8.2* 8.9 9.1 9.3 9.2   GFR: Estimated Creatinine Clearance: 80.6 mL/min (A) (by C-G formula based on SCr of 1.09 mg/dL (H)). Recent Labs  Lab 06/08/18 0322 06/10/18 0544 06/12/18 0720 06/14/18 0533  WBC 8.1 7.4 8.0 7.8    Liver Function Tests: Recent Labs  Lab 06/08/18 0322 06/10/18 0544 06/14/18 0533  AST 21 23 23     ALT 13 15 14   ALKPHOS 80 79 92  BILITOT 1.1 1.1 1.3*  PROT 8.5* 8.8* 8.7*  ALBUMIN 2.5* 2.6* 2.4*   No results for input(s): LIPASE, AMYLASE in the last 168 hours. Recent Labs  Lab 06/10/18 0544 06/14/18 0533  AMMONIA 48* 99*    ABG    Component Value Date/Time   PHART 7.462 (H) 06/14/2018 0525   PCO2ART 70.0 (HH) 06/14/2018 0525   PO2ART 66.1 (L) 06/14/2018 0525   HCO3 49.3 (H) 06/14/2018 0525   TCO2 >50 (H) 06/06/2018 0906   O2SAT 94.4 06/14/2018 0525     Coagulation Profile: No results for input(s): INR, PROTIME in the last 168 hours.  Cardiac Enzymes: No results for input(s): CKTOTAL, CKMB, CKMBINDEX, TROPONINI in the last 168 hours.  HbA1C: Hemoglobin A1C  Date/Time Value Ref Range Status  01/23/2018 02:46 PM 8.0 (A) 4.0 - 5.6 % Final  10/06/2016 04:43 PM 10.0  Final   HbA1c, POC (prediabetic range)  Date/Time Value Ref Range Status  01/23/2018 02:46 PM 8 (A) 5.7 - 6.4 %  Final   HbA1c, POC (controlled diabetic range)  Date/Time Value Ref Range Status  01/23/2018 02:46 PM 8.0 (A) 0.0 - 7.0 % Final   HbA1c POC (<> result, manual entry)  Date/Time Value Ref Range Status  01/23/2018 02:46 PM 8 4.0 - 5.6 % Final   Hgb A1c MFr Bld  Date/Time Value Ref Range Status  06/01/2018 10:24 AM 7.1 (H) 4.8 - 5.6 % Final    Comment:    (NOTE) Pre diabetes:          5.7%-6.4% Diabetes:              >6.4% Glycemic control for   <7.0% adults with diabetes   05/21/2015 05:26 AM 7.9 (H) 4.8 - 5.6 % Final    Comment:    (NOTE)         Pre-diabetes: 5.7 - 6.4         Diabetes: >6.4         Glycemic control for adults with diabetes: <7.0     CBG: Recent Labs  Lab 06/13/18 0639 06/13/18 1144 06/13/18 1643 06/13/18 2158 06/14/18 0627  GLUCAP 109* 163* 168* 142* 109*      Critical care time: Ferol Luz Minor ACNP Maryanna Shape PCCM Pager [phone] till 1 pm If no answer page [phone] 06/14/2018, 10:11 AM  Attending Note:  58 year old with chronic  hypercarbic and hypoxemic respiratory failure due to COPD and morbid obesity with sleep apnea who presents to PCCM for BiPAP and O2 need.  Patient is chronically on BiPAP at home whenever asleep.  On exam, decreased BS at the bases.  I reviewed CXR myself, small lung volume noted but no acute disease.  Discussed with PCCM-NP.  Chronic respiratory failure:  - BiPAP at night mandatory and while asleep during the day  - No need for intubation at this time  - ABG reviewed, this is a chronic process  - D/C further diamox, this is a chronic process  Hypoxemia:  - Titrate O2 for sat of 88-92%  OSA:  - BiPAP as above  - Will need a sleep study as outpatient  COPD:  - BD as ordered  PCCM will sign off, please call back if needed, F/U with pulmonary when closer to discharge  Patient seen and examined, agree with above note.  I dictated the care and orders written for this patient under my direction.  Rush Farmer, [place]

## 2018-06-14 NOTE — Progress Notes (Signed)
Speech Language Pathology Daily Session Note  Patient Details  Name: Davis Vannatter MRN: [id number] Date of Birth: [birth year]  Today's Date: 06/14/2018 SLP Individual Time: 0223-3612 SLP Individual Time Calculation (min): 45 min  Short Term Goals: Week 1: SLP Short Term Goal 1 (Week 1): Pt will demonstrate sustained attention to functional task in 5 minute intervals with min A verbal cues to redirect. SLP Short Term Goal 1 - Progress (Week 1): Updated due to goal met SLP Short Term Goal 2 (Week 1): Pt will demonstrate basic and functional problem solving skills in functional tasks with mod A verbal cues.  SLP Short Term Goal 2 - Progress (Week 1): Updated due to goal met SLP Short Term Goal 3 (Week 1): Pt will self-monitor functional errors during functional tasks with mod A verbal cues.  SLP Short Term Goal 3 - Progress (Week 1): Updated due to goal met SLP Short Term Goal 4 (Week 1): Pt will demonstrate recall of daily information with min A verbal cues to utilizie external aid.  SLP Short Term Goal 4 - Progress (Week 1): Updated due to goal met SLP Short Term Goal 5 (Week 1): Pt will consume regular textured foods with min overt s/s aspiration Mod I.  SLP Short Term Goal 5 - Progress (Week 1): Updated due to goal met  Skilled Therapeutic Interventions:Skilled ST services focused on swallow and cognitive skills. Pt was asleep with CPAP donned upon entering room with, [name] present.  SLP requested to remove CPAP and participate in ST services, pt initially refused after max encouragement and 15 minutes later, pt allowed removal of CPAP and nasal canula O2 was secured. [name] left room, pt was upset and asked him to stay. Following 2 minutes pt began speaking to [name] and required cues for recall of events. Pt requested her fruit from the pt fridge, SLP provided. Pt demonstrated mod I use of swallow strageties with regular textured foods with set up assist and preparation of food " aren't you  going to take the green things off" referring to strawberries. SLP facilitated basic problem solving and sustained attention to task utilizing simple PEG design task, pt required mod A verbal cues for problme solving/error awareness and demonstrated sustained attention in 5 minute intervals with mod A verbal cues. Pt requested to lay back down and form application of CPAP. Pt became very upset, yelling " you people are trying to kill me" and shaking fists, because it took ST longer than pt expected to apply CPAP and pt reported it was incorrect, SLP notified nursing staff to check CPAP application. Pt continues to demonstrate max encouragement to participate in therapy and a poor tolerance of frustration. Pt was left in room with call bell within reach and bed alarm set. SLP reccomends to continue skilled services.     Pain Pain Assessment Pain Score: 0-No pain  Therapy/Group: Individual Therapy  Tranika Scholler  [facility] 06/14/2018, 12:37 PM

## 2018-06-14 NOTE — Progress Notes (Signed)
Occupational Therapy Session Note  Patient Details  Name: Tamara Silva MRN: [id number] Date of Birth: 12-27-1960  Today's Date: 06/14/2018 OT Individual Time: 0704-0800 OT Individual Time Calculation (min): 56 min    Short Term Goals: Week 1:  OT Short Term Goal 1 (Week 1): Pt will complete BSC transfer with 2 assist and LRAD OT Short Term Goal 2 (Week 1): Pt will complete 1 self care task EOB to increase upright tolerance   Skilled Therapeutic Interventions/Progress Updates:    Upon entering the room, pt supine in bed with no c/o pain but did require encouragement for OT intervention this session. Pt performed supine>sit with steady assistance and min verbal cuing for proper technique. Pt seated on EOB with close supervision for 30 minutes to eat breakfast. Pt needing set up A to open containers and limiting distractions. OT orienting pt to time and pt began crying as her mother's birthday was recent and she is now passes. Pt needing cuing for pursed lip breathing to remain calm and OT able to redirect pt. OT discussed schedule with pt and began prepping pt to get out of bed. Pt declined OOB this session but agreeable to attempt with PT later this morning. [name], [name], present in room as well. Pt remained seated on EOB as she transitioned to PT session. Call bell and all needed items within reach.   Therapy Documentation Precautions:  Precautions Precautions: Fall Precaution Comments: Per chart, 4L 02 at baseline Restrictions Weight Bearing Restrictions: No General: General PT Missed Treatment Reason: Other (Comment)(high pCO2, RT in room to hook up to bipap) Vital Signs: Therapy Vitals Pulse Rate: 65 Resp: 18 Patient Position (if appropriate): Lying Oxygen Therapy SpO2: 98 % O2 Device: Nasal Cannula O2 Flow Rate (L/min): 2 L/min FiO2 (%): 28 % Pain:   ADL: ADL Eating: Not assessed Grooming: Not assessed Upper Body Bathing: Maximal assistance Where  Assessed-Upper Body Bathing: Bed level Lower Body Bathing: Other (comment)(2 helpers) Where Assessed-Lower Body Bathing: Bed level Upper Body Dressing: Dependent Where Assessed-Upper Body Dressing: Bed level Lower Body Dressing: Dependent Where Assessed-Lower Body Dressing: Bed level Toileting: Not assessed Toilet Transfer: Not assessed Walk-In Shower Transfer: Not assessed   Therapy/Group: Individual Therapy  Gypsy Decant 06/14/2018, 10:48 AM

## 2018-06-14 NOTE — Progress Notes (Signed)
[facility] PHYSICAL MEDICINE & REHABILITATION PROGRESS NOTE  Subjective/Complaints: Patient sitting up at edge of bed with OT.  Much more alert and lucid this morning.  Still states that she would like to sleep better.  ROS: Limited due to cognitive/behavioral    Objective: Vital Signs: Blood pressure (!) 109/50, pulse (!) 59, temperature 97.7 F (36.5 C), temperature source Oral, resp. rate 19, height 5' 7.5" (1.715 m), weight 132.7 kg, last menstrual period 07/28/2013, SpO2 100 %. Dg Chest 2 View  Result Date: 06/12/2018 CLINICAL DATA:  Cough, wheezing and shortness of breath. EXAM: CHEST - 2 VIEW COMPARISON:  Single-view of the chest 06/09/2018 and 06/04/2018. PA and lateral chest 07/25/2017. CT chest 06/28/2015. FINDINGS: Cardiomegaly and pulmonary edema are chronic and appear unchanged. Pleural scar and calcification along the left upper chest wall are again identified. No pneumothorax or pleural effusion. No acute bony abnormality. IMPRESSION: Cardiomegaly and chronic mild pulmonary edema. Electronically Signed   By: Inge Rise M.D.   On: 06/12/2018 08:58   Dg Abd 1 View  Result Date: 06/12/2018 CLINICAL DATA:  Patient complains of constipation. EXAM: ABDOMEN - 1 VIEW COMPARISON:  June 05, 2018 FINDINGS: Mild fecal loading in the colon. No bowel obstruction. No free air, portal venous gas, pneumatosis seen on supine imaging. No other acute abnormalities. IMPRESSION: Mild fecal loading in the colon. Electronically Signed   By: Dorise Bullion III M.D   On: 06/12/2018 19:39   Recent Labs    06/12/18 0720  WBC 8.0  HGB 8.1*  HCT 31.6*  PLT 240   Recent Labs    06/13/18 0528 06/14/18 0533  NA 140 140  K 3.2* 3.0*  CL 82* 87*  CO2 >50* 47*  GLUCOSE 117* 124*  BUN 19 17  CREATININE 1.11* 1.09*  CALCIUM 9.3 9.2    Physical Exam: BP (!) 109/50 (BP Location: Right Arm)   Pulse (!) 59   Temp 97.7 F (36.5 C) (Oral)   Resp 19   Ht 5' 7.5" (1.715 m)   Wt 132.7 kg    LMP 07/28/2013 Comment: perimenopausal  SpO2 100%   BMI 45.14 kg/m  Constitutional: No distress . Vital signs reviewed. HEENT: EOMI, oral membranes moist Neck: supple Cardiovascular: RRR without murmur. No JVD    Respiratory: Fair air movement, scattered rhonchi, crackles at both bases.  No obvious distress GI: BS +, non-tender, non-distended  Musculoskeletal: Edemapresent in bilateral lower extremities.  Neurological:      Patient alert and oriented.  Makes good eye contact.  Speech quality and volume improved.  Good trunk control today. Motor: moves all 4's Skin: Skin iswarm.   Skin tears on trunk as well as lesions on legs and back noted. Psychiatric:   Alert and cooperative.  Assessment/Plan: 1. Functional deficits secondary to debility which require 3+ hours per day of interdisciplinary therapy in a comprehensive inpatient rehab setting.  Physiatrist is providing close team supervision and 24 hour management of active medical problems listed below.  Physiatrist and rehab team continue to assess barriers to discharge/monitor patient progress toward functional and medical goals  Care Tool:  Bathing    Body parts bathed by patient: Face   Body parts bathed by helper: Right arm, Left arm, Chest, Abdomen, Front perineal area, Buttocks, Right upper leg, Left upper leg, Right lower leg, Left lower leg     Bathing assist Assist Level: Maximal Assistance - Patient 24 - 49%     Upper Body Dressing/Undressing Upper body dressing  What is the patient wearing?: Hospital gown only    Upper body assist Assist Level: Maximal Assistance - Patient 25 - 49%    Lower Body Dressing/Undressing Lower body dressing      What is the patient wearing?: Pants, Incontinence brief     Lower body assist Assist for lower body dressing: Maximal Assistance - Patient 25 - 49%     Toileting Toileting Toileting Activity did not occur (Clothing management and hygiene only):  (Incontinent)  Toileting assist Assist for toileting: 2 Helpers     Transfers Chair/bed transfer  Transfers assist  Chair/bed transfer activity did not occur: Safety/medical concerns  Chair/bed transfer assist level: Dependent - Patient 0%     Locomotion Ambulation   Ambulation assist   Ambulation activity did not occur: Safety/medical concerns          Walk 10 feet activity   Assist  Walk 10 feet activity did not occur: Refused        Walk 50 feet activity   Assist Walk 50 feet with 2 turns activity did not occur: Safety/medical concerns         Walk 150 feet activity   Assist Walk 150 feet activity did not occur: Safety/medical concerns         Walk 10 feet on uneven surface  activity   Assist Walk 10 feet on uneven surfaces activity did not occur: Safety/medical concerns         Wheelchair     Assist Will patient use wheelchair at discharge?: Yes Type of Wheelchair: Manual Wheelchair activity did not occur: Safety/medical concerns         Wheelchair 50 feet with 2 turns activity    Assist    Wheelchair 50 feet with 2 turns activity did not occur: Safety/medical concerns       Wheelchair 150 feet activity     Assist Wheelchair 150 feet activity did not occur: Safety/medical concerns          Medical Problem List and Plan: 1.Functional deficitssecondary to metabolic encephalopathy, right occipital-parietal infact and debility after multiple medical issues  -Continue CIR therapies including PT, OT, and SLP as tolerated    2. DVT Prophylaxis/Anticoagulation: Pharmaceutical:Lovenox 3. Pain Management:Tylenol as needed 4. Mood:LCSW to follow for evaluation and support 5. Neuropsych: This patientis not fullycapable of making decisions on hisown behalf. 6. Skin/Wound Care:Routine pressure relief measures 7. Fluids/Electrolytes/Nutrition:Monitor I/O.   I personally reviewed all lab work and imaging  today..   8. COPD with chronic hypoxic respiratory failure: oxygen dependent--uses 4 L at baseline.  Today's ABG showing improvement with PCO2 down to 70.  Bicarb a little better at 49.  -Clinically patient appears more alert and lucid  -BiPAP  -appreciate pulmonary assistance   -Continue Diamox per pulmonary to help with bicarb and fluid overload 9. 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  Added nebs prn. 10. Acute on chronic CHF: Low-salt diet. On Coreg, statin. May need LFTs checked every 2 months.   Chest x-ray on 1/10 with congestion---repeat cxr 1/13 doesn't look much different  Diamox 500mg  iv given again yesterday  -Serial ABGs Filed Weights   06/12/18 0506 06/13/18 0314 06/14/18 0402  Weight: (!) 138.9 kg (!) 138.3 kg 132.7 kg   Weight trending down    11. Acute on chronic anemia: Improved post transfusion. Now back on ASA--monitor with serial checks.   Hemoglobin 7.9 on 1/15, actually holding steady   12.Elevated ammonia levels:  Ammonia level up to 99 today 1/15  Increase lactulose 13.Multifactorial encephalopathy: appears to be improving with rx of ammonia, CO2, and CHF  -continue to monitor clinically 14. T2DM: Monitor BS ac/hs. Continue Levemir with SSI. Intake variable --will continue to titrate as indicated.  Relatively controlled on 1/13  No changes today 15.  Morbid obesity  Encourage weight loss 16.  Constipation  Bowel regimen increased on 1/11, increased again on 1/12-lactulose added--we will increase this as noted above  -Results with sorbitol yesterday 17.  Labile blood pressure  Fair control at present  LOS: 5 days A FACE TO FACE EVALUATION WAS PERFORMED  Meredith Staggers 06/14/2018, 8:38 AM

## 2018-06-14 NOTE — Progress Notes (Signed)
Critical ABG results hand delivered Dauda Collier,RN at 05:55.

## 2018-06-14 NOTE — Progress Notes (Signed)
RT NOTEL Pt requesting to go back on bipap for rest. Patient placed back on bipap through dreamstation at settings of 18/5 with 5L O2 bleed in. Vitals are stable and patient is resting comfortably. RT will continue to monitor.

## 2018-06-15 ENCOUNTER — Inpatient Hospital Stay (HOSPITAL_COMMUNITY): Payer: Self-pay | Admitting: Physical Therapy

## 2018-06-15 ENCOUNTER — Inpatient Hospital Stay (HOSPITAL_COMMUNITY): Payer: Self-pay | Admitting: Occupational Therapy

## 2018-06-15 ENCOUNTER — Inpatient Hospital Stay (HOSPITAL_COMMUNITY): Payer: Self-pay | Admitting: Speech Pathology

## 2018-06-15 LAB — AMMONIA: Ammonia: 67 umol/L — ABNORMAL HIGH (ref 9–35)

## 2018-06-15 LAB — GLUCOSE, CAPILLARY
Glucose-Capillary: 168 mg/dL — ABNORMAL HIGH (ref 70–99)
Glucose-Capillary: 194 mg/dL — ABNORMAL HIGH (ref 70–99)
Glucose-Capillary: 197 mg/dL — ABNORMAL HIGH (ref 70–99)
Glucose-Capillary: 162 mg/dL — ABNORMAL HIGH (ref 70–99)

## 2018-06-15 NOTE — Telephone Encounter (Signed)
Thanks so much. 

## 2018-06-15 NOTE — Progress Notes (Addendum)
Speech Language Pathology Weekly Progress and Session Note  Patient Details  Name: Tamara Silva MRN: [id number] Date of Birth: 1961-03-27  Beginning of progress report period: June 10, 2018 End of progress report period: June 15, 2018  Today's Date: 06/16/2018   Skilled treatment session #1 SLP Individual Time: 1130-1200 SLP Individual Time Calculation: 30 min  Skilled treatment session #2 SLP Individual Time: 1300-1330 SLP Individual Time Calculation:  30 min  Short Term Goals: Week 1: SLP Short Term Goal 1 (Week 1): Pt will demonstrate sustained attention to functional task in 5 minute intervals with min A verbal cues to redirect. SLP Short Term Goal 1 - Progress (Week 1): Progressing toward goal SLP Short Term Goal 2 (Week 1): Pt will demonstrate basic and functional problem solving skills in functional tasks with mod A verbal cues.  SLP Short Term Goal 2 - Progress (Week 1): Not met SLP Short Term Goal 3 (Week 1): Pt will self-monitor functional errors during functional tasks with mod A verbal cues.  SLP Short Term Goal 3 - Progress (Week 1): Not met SLP Short Term Goal 4 (Week 1): Pt will demonstrate recall of daily information with min A verbal cues to utilizie external aid.  SLP Short Term Goal 4 - Progress (Week 1): Not met SLP Short Term Goal 5 (Week 1): Pt will consume regular textured foods with min overt s/s aspiration Mod I.  SLP Short Term Goal 5 - Progress (Week 1): Met    New Short Term Goals: Week 2: SLP Short Term Goal 1 (Week 2): Pt will demonstrate sustained attention to functional task in 5 minute intervals with Min A verbal cues. SLP Short Term Goal 2 (Week 2): Given Mod A cues, pt will complete basic familiar tasks related to ADLs.   SLP Short Term Goal 3 (Week 2): Given visual aid, pt will answer orientation questions with supervision cues.  SLP Short Term Goal 4 (Week 2): While looking at a picture, pt will produce simple cohesive sentence about  picture with Min A cues on 8 of 10 opportunities.   Weekly Progress Updates:  Pt had made intermittent progress but her overall ability to meet the above mentioned STGs has been limited by preseverative on topics, disorganized thought processes and overall poor task tolerance. Per chart review, pt has not responded well to therapist's directions during therapy tasks. This is likely due in part to fact that pt had lack of demand placed on her at baseline d/t her multiple medical and physical co-morbidities. As such, pt continues to require skilled tasks to facilitate sustained attention to tasks, problem solving within context of very familiar tasks of daily living, use of visual aids to locate information and tasks to target organization of thought.   Daily Session  Skilled Therapeutic Interventions:   Skilled treatment session #1 focused on cognition goals. SLP facilitated session by stating that SLP really needed help sorting beads according to color. Pt was responsive to SLP's request for help. Pt able to sort by specified color with good problem solving ability and good attention to task with Mod I for ~ 10 minutes. After sorting was completed, pt demonstrated some word finding errors when asking about/commenting on lunch - NT comment in hallway precipitated comment. Pt's word finding appears related to decreased thought organization.  Pt able to complete tray set-up with supervision.  Skilled treatment session #2 focused on cognition goals. SLP facilitated session by providing verbal description task to target thought organization. SLP presented pt  with basic picture and asked for help in describing pt. SLP provided basic sentence describing picture and asked for pt's help in creating another sentence about picture. Pt able to formulate sentence in 5 out 10 opportunities with Mod A cues. Pt able to sustain attention to task for ~ 15 minutes and then began perseverating on boyfriend's location.     No pain reported    Therapy/Group: Individual Therapy  Aveen Stansel 06/16/2018, 3:01 PM

## 2018-06-15 NOTE — Progress Notes (Addendum)
[facility] PHYSICAL MEDICINE & REHABILITATION PROGRESS NOTE  Subjective/Complaints: Patient sitting up at edge of bed with OT.  Much more alert and lucid this morning.  Still states that she would like to sleep better.  ROS: Limited due to cognitive/behavioral    Objective: Vital Signs: Blood pressure 117/63, pulse (!) 59, temperature (!) 97.5 F (36.4 C), temperature source Oral, resp. rate 19, height 5' 7.5" (1.715 m), weight (!) 138.7 kg, last menstrual period 07/28/2013, SpO2 100 %. No results found. Recent Labs    06/14/18 0533  WBC 7.8  HGB 7.9*  HCT 29.4*  PLT 191   Recent Labs    06/13/18 0528 06/14/18 0533  NA 140 140  K 3.2* 3.0*  CL 82* 87*  CO2 >50* 47*  GLUCOSE 117* 124*  BUN 19 17  CREATININE 1.11* 1.09*  CALCIUM 9.3 9.2    Physical Exam: BP 117/63 (BP Location: Left Arm)   Pulse (!) 59   Temp (!) 97.5 F (36.4 C) (Oral)   Resp 19   Ht 5' 7.5" (1.715 m)   Wt (!) 138.7 kg   LMP 07/28/2013 Comment: perimenopausal  SpO2 100%   BMI 47.18 kg/m  Constitutional: No distress . Vital signs reviewed. obese HEENT: EOMI, oral membranes moist Neck: supple Cardiovascular: RRR without murmur. No JVD    Respiratory: scattered rhonchi. Normal effort    GI: BS +, non-tender, non-distended  Musculoskeletal: Edemapresent in bilateral lower extremities.  Neurological:      alert, oriented to person and place.  Speech quality and volume improved.  Good trunk control today. Motor: moves all 4's Skin: Skin iswarm.   Skin tears on trunk as well as lesions on legs and back noted. Psychiatric:   Alert and cooperative.  Assessment/Plan: 1. Functional deficits secondary to debility which require 3+ hours per day of interdisciplinary therapy in a comprehensive inpatient rehab setting.  Physiatrist is providing close team supervision and 24 hour management of active medical problems listed below.  Physiatrist and rehab team continue to assess barriers to  discharge/monitor patient progress toward functional and medical goals  Care Tool:  Bathing    Body parts bathed by patient: Face   Body parts bathed by helper: Right arm, Left arm, Chest, Abdomen, Front perineal area, Buttocks, Right upper leg, Left upper leg, Right lower leg, Left lower leg     Bathing assist Assist Level: Maximal Assistance - Patient 24 - 49%     Upper Body Dressing/Undressing Upper body dressing   What is the patient wearing?: Hospital gown only    Upper body assist Assist Level: Maximal Assistance - Patient 25 - 49%    Lower Body Dressing/Undressing Lower body dressing      What is the patient wearing?: Pants, Incontinence brief     Lower body assist Assist for lower body dressing: Maximal Assistance - Patient 25 - 49%     Toileting Toileting Toileting Activity did not occur (Clothing management and hygiene only): (Incontinent)  Toileting assist Assist for toileting: 2 Helpers     Transfers Chair/bed transfer  Transfers assist  Chair/bed transfer activity did not occur: Safety/medical concerns  Chair/bed transfer assist level: Dependent - Patient 0%     Locomotion Ambulation   Ambulation assist   Ambulation activity did not occur: Safety/medical concerns  Assist level: Minimal Assistance - Patient > 75% Assistive device: Walker-rolling Max distance: 5'   Walk 10 feet activity   Assist  Walk 10 feet activity did not occur: Refused  Walk 50 feet activity   Assist Walk 50 feet with 2 turns activity did not occur: Safety/medical concerns         Walk 150 feet activity   Assist Walk 150 feet activity did not occur: Safety/medical concerns         Walk 10 feet on uneven surface  activity   Assist Walk 10 feet on uneven surfaces activity did not occur: Safety/medical concerns         Wheelchair     Assist Will patient use wheelchair at discharge?: Yes Type of Wheelchair: Manual Wheelchair  activity did not occur: Safety/medical concerns         Wheelchair 50 feet with 2 turns activity    Assist    Wheelchair 50 feet with 2 turns activity did not occur: Safety/medical concerns       Wheelchair 150 feet activity     Assist Wheelchair 150 feet activity did not occur: Safety/medical concerns          Medical Problem List and Plan: 1.Functional deficitssecondary to metabolic encephalopathy, right occipital-parietal infact and debility after multiple medical issues  -Continue CIR therapies including PT, OT, and SLP as tolerated    2. DVT Prophylaxis/Anticoagulation: Pharmaceutical:Lovenox 3. Pain Management:Tylenol as needed 4. Mood:LCSW to follow for evaluation and support 5. Neuropsych: This patientis not fullycapable of making decisions on hisown behalf. 6. Skin/Wound Care:Routine pressure relief measures 7. Fluids/Electrolytes/Nutrition:Monitor I/O.   I personally reviewed all lab work and imaging today..   8. COPD with chronic hypoxic respiratory failure: oxygen dependent--uses 4 L at baseline.  Today's ABG showing improvement with PCO2 down to 70.  Bicarb a little better at 49.  -Clinically patient appears more alert and lucid  -BiPAP  -appreciate pulmonary assistance   -Continue Diamox per pulmonary to help with bicarb and fluid overload 9. 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  Added nebs prn. 10. Acute on chronic CHF: Low-salt diet. On Coreg, statin. May need LFTs checked every 2 months.   Chest x-ray on 1/10 with congestion---repeat cxr 1/13 doesn't look much different  Diamox 500mg  iv given per [facility]   -recent abg's with improvement Filed Weights   06/13/18 0314 06/14/18 0402 06/15/18 0430  Weight: (!) 138.3 kg 132.7 kg (!) 138.7 kg   Weights stable?    11. Acute on chronic anemia: Improved post transfusion. Now back on ASA--monitor with serial checks.   Hemoglobin 7.9 on 1/15,  actually holding steady   12.Elevated ammonia levels:    Ammonia level 99--->1/16 67. Recheck tomorrow  Increased lactulose 1/15 13.Multifactorial encephalopathy: appears to be improving with rx of ammonia, CO2, and CHF  -continue to monitor clinically 14. T2DM: Monitor BS ac/hs. Continue Levemir with SSI. Intake variable --will continue to titrate as indicated.  Fair control 1/16  No changes today 15.  Morbid obesity  Encourage weight loss 16.  Constipation  Bowel regimen increased on 1/11, increased again on 1/12-lactulose added--we will increase this as noted above  -Results with sorbitol   17.  Labile blood pressure   Fair control at present  LOS: 6 days A FACE TO FACE EVALUATION WAS PERFORMED  Meredith Staggers 06/15/2018, 9:07 AM

## 2018-06-15 NOTE — Progress Notes (Signed)
Occupational Therapy Session Note  Patient Details  Name: Tamara Silva MRN: [id number] Date of Birth: 11/07/1960  Today's Date: 06/15/2018 OT Individual Time: 0700 - 0800 and 1455(make up time)-1505 OT Individual Time Calculation (min): 60 min and  10 min   Today's Date: 06/15/2018 OT Missed Time: 44 Minutes ( PM session) Missed Time Reason: Patient fatigue   Short Term Goals: Week 1:  OT Short Term Goal 1 (Week 1): Pt will complete BSC transfer with 2 assist and LRAD OT Short Term Goal 2 (Week 1): Pt will complete 1 self care task EOB to increase upright tolerance   Skilled Therapeutic Interventions/Progress Updates:    Session 1: Upon entering the room, pt supine in bed with no c/o pain this session. Pt very slow to initiate sitting on EOB for self care task. Pt needing maximal encouragement but agreeable to bathing and dressing. However, pt would only wash UB this session but did so with increased time, set up, and mod cuing for sequencing and initiation. Pt standing from hospital bed with use of RW and mod lifting assistance. Stand pivot transfer with RW and min A. OT placed alarm belt for safety and pt agreeable to remaining up. Call bell within reach and RN entering the room with medications as therapist exited.   Session 2: Upon entering the room, pt supine in bed sleeping. Significant other present in the room. Pt with no c/o pain this session. OT providing encouragement for pt to participate even if just from bed level but pt refused secondary to fatigue. Pt remained in bed with call bell and all needed items within reach. Bed alarm activated for safety.  35 missed minutes of OT intervention.    Therapy Documentation Precautions:  Precautions Precautions: Fall Precaution Comments: Per chart, 4L 02 at baseline Restrictions Weight Bearing Restrictions: No General: General OT Amount of Missed Time: 35 Minutes Vital Signs: Therapy Vitals Temp: 40 F (36.7 C) Temp Source:  Oral Pulse Rate: 67 Resp: 14 BP: (!) 119/59 Patient Position (if appropriate): Sitting Oxygen Therapy SpO2: 100 % O2 Device: Room Air Pain:   ADL: ADL Eating: Not assessed Grooming: Not assessed Upper Body Bathing: Maximal assistance Where Assessed-Upper Body Bathing: Bed level Lower Body Bathing: Other (comment)(2 helpers) Where Assessed-Lower Body Bathing: Bed level Upper Body Dressing: Dependent Where Assessed-Upper Body Dressing: Bed level Lower Body Dressing: Dependent Where Assessed-Lower Body Dressing: Bed level Toileting: Not assessed Toilet Transfer: Not assessed Walk-In Shower Transfer: Not assessed   Therapy/Group: Individual Therapy  Gypsy Decant 06/15/2018, 4:33 PM

## 2018-06-15 NOTE — Patient Care Conference (Signed)
Inpatient RehabilitationTeam Conference and Plan of Care Update Date: 06/13/2018   Time: 3:35 PM    Patient Name: Tamara Silva      Medical Record Number: [id number]  Date of Birth: Nov 02, 1960 Sex: Female         Room/Bed: 4W10C/4W10C-01 Payor Info: Payor: MEDICAID [place] / Plan: MEDICAID [place] ACCESS / Product Type: *No Product type* /    Admitting Diagnosis: stroke  Admit Date/Time:  06/09/2018  2:10 PM Admission Comments: No comment available   Primary Diagnosis:  <principal problem not specified> Principal Problem: <principal problem not specified>  Patient Active Problem List   Diagnosis Date Noted  . Chronic respiratory failure with hypoxia and hypercapnia (HCC)   . Labile blood pressure   . Wheezing   . Hyperammonemia ([place])   . Acute on chronic diastolic CHF (congestive heart failure) ([place])   . Supplemental oxygen dependent   . Slow transit constipation   . Encephalopathy 06/09/2018  . Cocaine abuse ([place])   . Diabetes mellitus type 2 in obese ([place])   . Generalized tonic-clonic seizure ([place]) 06/05/2018  . Cerebral embolism with cerebral infarction 06/01/2018  . AKI (acute kidney injury) ([place]) 05/30/2018  . Pressure injury of skin 05/30/2018  . Acute on chronic anemia 05/29/2018  . Chronic respiratory failure with hypoxia, on home O2 therapy ([place]) 01/27/2018  . Iron deficiency anemia 01/27/2018  . Tobacco abuse disorder 10/03/2017  . Dyslipidemia 04/29/2016  . Primary insomnia 04/29/2016  . Hypoxemia 09/08/2015  . Congestive heart failure (CHF) ([place]) 09/08/2015  . Obesity hypoventilation syndrome ([place])   . Microcytic anemia   . Acute encephalopathy   . Uncontrolled type 2 diabetes mellitus with hyperosmolarity without coma ([place])   . Acute on chronic respiratory failure with hypercapnia ([place])   . CAP (community acquired pneumonia)   . OSA (obstructive sleep apnea)   . Acute and chr resp failure, unsp w hypoxia or hypercapnia ([place]) 06/22/2015  . Acute on chronic  combined systolic (congestive) and diastolic (congestive) heart failure ([place]) 06/21/2015  . Hypoglycemia 06/21/2015  . Uncontrolled type 2 diabetes mellitus without complication, with long-term current use of insulin ([place])   . Dyslipidemia associated with type 2 diabetes mellitus ([place]) 05/21/2015  . Anemia of chronic kidney failure 05/21/2015  . Hyperkalemia 05/21/2015  . COPD GOLD III  06/10/2014  . Acute respiratory failure with hypoxia ([place]) 03/04/2014  . CKD (chronic kidney disease), stage II 02/07/2014  . COPD exacerbation ([place]) 02/01/2014  . CAD S/P percutaneous coronary angioplasty - prior PCI to LAD; RCA PCI: new Xience Alpine DES 2.75 mm x 15 mm  10/07/2013  . Morbid obesity ([place]) 07/30/2013  . Chronic combined systolic and diastolic heart failure ([place]) 07/10/2013  . Chronic pulmonary embolism ([place]) 02/08/2013  . Bipolar disorder ([place]) 02/08/2013  . Essential hypertension 02/08/2013  . Chronic anticoagulation 11/27/2012  . Edema extremities 10/15/2012  . COPD (chronic obstructive pulmonary disease) ([place]) 10/15/2012  . Pulmonary embolism on right ([place]) 10/15/2012  . Hypertension 10/15/2012  . Insulin dependent diabetes mellitus ([place]) 10/15/2012    Expected Discharge Date: Expected Discharge Date: (about 3 weeks, depending on pt's progress)  Team Members Present: Physician leading conference: Dr. Alger Simons Social Worker Present: Alfonse Alpers, LCSW Nurse Present: Dorthula Nettles, RN PT Present: Other (comment)(Taylor Ervin Knack, PT) OT Present: Other (comment)(Sandra Rosana Hoes, OT) SLP Present: Charolett Bumpers, SLP PPS Coordinator present : Gunnar Fusi     Current Status/Progress Goal Weekly Team Focus  Medical   Patient admitted to rehab on  06/09/2018 with multi factorial encephalopathy and debility.  She is morbidly obese.  She has chronic obstructive pulmonary disease with high levels of carbon dioxide  Improve ventilation, adjust medication regimen to improve metabolic  abnormalities  Pulmonary management as above, pulmonary consult, working on bowel movements, lactulose for ammonia, diuresis for congestive heart failure   Bowel/Bladder   Incontinent of bowel/bladder. LBM 06/12/18  To be continent of bowel/bladder with mod assist      Swallow/Nutrition/ Hydration   Mod I reg/thin   Mod I       ADL's   max A +2 LB ADLs, min A UB   Mod A goals       Mobility   min A bed mobility, assist x 2 with stedy for standing and transfers  min to mod A overall  OOB tolerance   Communication             Safety/Cognition/ Behavioral Observations  Max-Mod A  Min A  basic problem solving, recall, sustained attention    Pain   No complain of pain  <2  Assess and treat pain q shift and as needed   Skin   MASD to bilateral breast with Intradry; MASD to the groin and butt with cream  Maintain skin integrity with mod assist  Assess skin q shift and as needed    Rehab Goals Patient on target to meet rehab goals: Yes Rehab Goals Revised: none *See Care Plan and progress notes for long and short-term goals.     Barriers to Discharge  Current Status/Progress Possible Resolutions Date Resolved   Physician    Medical stability;Weight        Ongoing medical management as outlined in detail in today's progress note      Nursing                  PT  Inaccessible home environment;Decreased caregiver support;Medical stability;Home environment access/layout;Behavior                 OT                  SLP                SW                Discharge Planning/Teaching Needs:  Pt plans to return to her home where she lives with her significant other, [name], who will provide necessary care.  Family education to be offered closer to d/c.   Team Discussion:  Pt with multiple medical problems.  Pt has COPD and is using BiPap currently and pulmonary is concerned she will need a trach soon.  They continue to follow pt.  Pt was not doing much prior to admission.  Pt is more  awake, has a cough.  Pt to have ABG on 06-14-18, is incontinent of both bowel and bladder, and blood sugars are pretty good.  Pt is on regular diet and thin liquids with intermittent supervision.  She was more lucid with ST on day of conference.  Pt's goals were upgrade to min A for speech therapy.  Pt did better with PT and was up to the chair with mod to max A.  OT was not able to see on day of conference due to extreme diarrhea.  Team discussed re-evaluating pt next week, but they expect pt to need about 3 weeks on CIR.  Revisions to Treatment Plan:  none    Continued Need for  Acute Rehabilitation Level of Care: The patient requires daily medical management by a physician with specialized training in physical medicine and rehabilitation for the following conditions: Daily direction of a multidisciplinary physical rehabilitation program to ensure safe treatment while eliciting the highest outcome that is of practical value to the patient.: Yes Daily medical management of patient stability for increased activity during participation in an intensive rehabilitation regime.: Yes Daily analysis of laboratory values and/or radiology reports with any subsequent need for medication adjustment of medical intervention for : Neurological problems;Pulmonary problems;Cardiac problems   I attest that I was present, lead the team conference, and concur with the assessment and plan of the team.   Anthonia Monger, Silvestre Mesi 06/15/2018, 9:53 AM

## 2018-06-15 NOTE — Progress Notes (Signed)
Physical Therapy Session Note  Patient Details  Name: Tamara Silva MRN: [id number] Date of Birth: 07/09/1960  Today's Date: 06/15/2018 PT Individual Time: 7048-8891; 1030-1110 PT Individual Time Calculation (min): 15 min and 40 min  Short Term Goals: Week 1:  PT Short Term Goal 1 (Week 1): Pt will perform bed<>WC transfer with mod assist  PT Short Term Goal 2 (Week 1): Pt will consistently participate in therapy with minimal encouragement.  PT Short Term Goal 3 (Week 1): Pt will remain out of bed up to 2 hours at a time.  PT Short Term Goal 4 (Week 1): Pt will perform all bed mobility with min assist.   Skilled Therapeutic Interventions/Progress Updates:    Session 1: Pt received seated in w/c in room getting ready to transfer to bedside commode with NT. Therapist assists NT with standing patient (min A x 2 from w/c height to stedy). Stedy transfer w/c to commode. Pt left seated on commode with NT assisting. Will attempt to see pt for full therapy session later in AM.  Session 2: Pt received seated in bed asleep, arousable and with max encouragement and increased time agreeable to participate in therapy session. Supine to sit with min A with encouragement needed. Pt becomes more alert once seated EOB. SpO2 99% at rest on 5L O2. Sit to stand with mod A to RW. Ambulation x 20 ft with RW and min A for balance. Pt reports feeling fearful of falling and requests her significant other stand by her while she is ambulating. SpO2 85% following gait while on 5L O2, instructed pt in pursed lip breathing techniques. SpO2 returns to 90% (+). Pt declines to perform any further ambulation this session. Encouraged pt to participate in standing activity or seated therex, pt declines and reports she is going to lay back down. Sit to supine mod A for BLE management. Pt left supine in bed with needs in reach, bed alarm in place, significant other present. Pt missed 20 min of therapy session due to unwillingness to  participate any further.  Therapy Documentation Precautions:  Precautions Precautions: Fall Precaution Comments: Per chart, 4L 02 at baseline Restrictions Weight Bearing Restrictions: No General: PT Amount of Missed Time (min): 20 Minutes PT Missed Treatment Reason: Patient unwilling to participate Pain: Pain Assessment Pain Scale: 0-10 Pain Score: 0-No pain    Therapy/Group: Individual Therapy   Excell Seltzer, PT, DPT  06/15/2018, 11:15 AM

## 2018-06-15 NOTE — Progress Notes (Signed)
Social Work Patient ID: Tamara Silva, female   DOB: 1960-11-20, 58 y.o.   MRN: [id number]   CSW met with pt to update her on team conference discussion and then with her dtr via telephone 06-15-18.  Explained that team expects her to need to be on CIR for 2 - 3 weeks and that we would re-evaluate targeted d/c date at next conference.  Pt is motivated to get better and get home.  Dtr wants pt and [name] to stay together and [name] is her caregiver, but she thinks the house is too much for them to keep up and she wonders if she should find them a senior apartment.  CSW suggested dtr start with Tax adviser for resources.  She was appreciative.  CSW will continue to follow and assist as needed.

## 2018-06-16 ENCOUNTER — Inpatient Hospital Stay (HOSPITAL_COMMUNITY): Payer: Self-pay | Admitting: Physical Therapy

## 2018-06-16 ENCOUNTER — Inpatient Hospital Stay (HOSPITAL_COMMUNITY): Payer: Self-pay | Admitting: Occupational Therapy

## 2018-06-16 ENCOUNTER — Inpatient Hospital Stay (HOSPITAL_COMMUNITY): Payer: Self-pay | Admitting: Speech Pathology

## 2018-06-16 LAB — GLUCOSE, CAPILLARY
Glucose-Capillary: 185 mg/dL — ABNORMAL HIGH (ref 70–99)
Glucose-Capillary: 167 mg/dL — ABNORMAL HIGH (ref 70–99)
Glucose-Capillary: 165 mg/dL — ABNORMAL HIGH (ref 70–99)
Glucose-Capillary: 198 mg/dL — ABNORMAL HIGH (ref 70–99)

## 2018-06-16 MED ORDER — INSULIN DETEMIR 100 UNIT/ML ~~LOC~~ SOLN
18.0000 [IU] | Freq: Every day | SUBCUTANEOUS | Status: DC
  Administered 2018-06-17 – 2018-06-22 (×5): 18 [IU] via SUBCUTANEOUS
  Filled 2018-06-16 (×7): qty 0.18

## 2018-06-16 NOTE — Progress Notes (Signed)
[facility] PHYSICAL MEDICINE & REHABILITATION PROGRESS NOTE  Subjective/Complaints: Pt up in bed. Scratching her legs because of "eczema". Was able to sleep last night. Denies breathing issues  ROS: Patient denies fever, rash, sore throat, blurred vision, nausea, vomiting, diarrhea, cough, shortness of breath or chest pain, joint or back pain, headache, or mood change.   Objective: Vital Signs: Blood pressure (!) 104/35, pulse 63, temperature 98.8 F (37.1 C), temperature source Oral, resp. rate 17, height 5' 7.5" (1.715 m), weight (!) 138.5 kg, last menstrual period 07/28/2013, SpO2 100 %. No results found. Recent Labs    06/14/18 0533  WBC 7.8  HGB 7.9*  HCT 29.4*  PLT 191   Recent Labs    06/14/18 0533  NA 140  K 3.0*  CL 87*  CO2 47*  GLUCOSE 124*  BUN 17  CREATININE 1.09*  CALCIUM 9.2    Physical Exam: BP (!) 104/35 (BP Location: Left Arm)   Pulse 63   Temp 98.8 F (37.1 C) (Oral)   Resp 17   Ht 5' 7.5" (1.715 m)   Wt (!) 138.5 kg   LMP 07/28/2013 Comment: perimenopausal  SpO2 100%   BMI 47.12 kg/m  Constitutional: No distress . Vital signs reviewed. HEENT: EOMI, oral membranes moist Neck: supple Cardiovascular: RRR without murmur. No JVD    Respiratory: CTA Bilaterally without wheezes or rales. Normal effort    GI: BS +, non-tender, non-distended  Musculoskeletal: Edemapresent in bilateral lower extremities.  Neurological:     Alert, oriented to person and place.  Speech quality and volume improved.  Good trunk control today. Motor: moves all 4's Skin: Skin iswarm.   Numerous scratches on both legs Psychiatric:   Alert and cooperative.  Assessment/Plan: 1. Functional deficits secondary to debility which require 3+ hours per day of interdisciplinary therapy in a comprehensive inpatient rehab setting.  Physiatrist is providing close team supervision and 24 hour management of active medical problems listed below.  Physiatrist and rehab team  continue to assess barriers to discharge/monitor patient progress toward functional and medical goals  Care Tool:  Bathing    Body parts bathed by patient: Right arm, Left arm, Chest, Abdomen(UB only)   Body parts bathed by helper: Right arm, Left arm, Chest, Abdomen, Front perineal area, Buttocks, Right upper leg, Left upper leg, Right lower leg, Left lower leg     Bathing assist Assist Level: Set up assist     Upper Body Dressing/Undressing Upper body dressing   What is the patient wearing?: Hospital gown only    Upper body assist Assist Level: Set up assist    Lower Body Dressing/Undressing Lower body dressing      What is the patient wearing?: Pants, Incontinence brief     Lower body assist Assist for lower body dressing: Maximal Assistance - Patient 25 - 49%     Toileting Toileting Toileting Activity did not occur (Clothing management and hygiene only): (Incontinent)  Toileting assist Assist for toileting: 2 Helpers     Transfers Chair/bed transfer  Transfers assist  Chair/bed transfer activity did not occur: Safety/medical concerns  Chair/bed transfer assist level: Minimal Assistance - Patient > 75%     Locomotion Ambulation   Ambulation assist   Ambulation activity did not occur: Safety/medical concerns  Assist level: Minimal Assistance - Patient > 75% Assistive device: Walker-rolling Max distance: 20'   Walk 10 feet activity   Assist  Walk 10 feet activity did not occur: Refused  Assist level: Minimal Assistance -  Patient > 75% Assistive device: Walker-rolling   Walk 50 feet activity   Assist Walk 50 feet with 2 turns activity did not occur: Safety/medical concerns         Walk 150 feet activity   Assist Walk 150 feet activity did not occur: Safety/medical concerns         Walk 10 feet on uneven surface  activity   Assist Walk 10 feet on uneven surfaces activity did not occur: Safety/medical concerns          Wheelchair     Assist Will patient use wheelchair at discharge?: Yes Type of Wheelchair: Manual Wheelchair activity did not occur: Safety/medical concerns         Wheelchair 50 feet with 2 turns activity    Assist    Wheelchair 50 feet with 2 turns activity did not occur: Safety/medical concerns       Wheelchair 150 feet activity     Assist Wheelchair 150 feet activity did not occur: Safety/medical concerns          Medical Problem List and Plan: 1.Functional deficitssecondary to metabolic encephalopathy, right occipital-parietal infact and debility after multiple medical issues  -Continue CIR therapies including PT, OT, and SLP    -continue to provide encouragement 2. DVT Prophylaxis/Anticoagulation: Pharmaceutical:Lovenox 3. Pain Management:Tylenol as needed 4. Mood:LCSW to follow for evaluation and support 5. Neuropsych: This patientis not fullycapable of making decisions on hisown behalf. 6. Skin/Wound Care:Routine pressure relief measures 7. Fluids/Electrolytes/Nutrition:Monitor I/O.    encourage PO 8. COPD with chronic hypoxic respiratory failure: oxygen dependent--uses 4 L at baseline.  Most recent ABG with improvement of PCO2 down to 70.  Bicarb   49.  -Clinically patient appears more alert and lucid  -continue BiPAP  -appreciate pulmonary assistance (signed off)   -no changes in plan at present 9. 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  Added nebs prn. 10. Acute on chronic CHF: Low-salt diet. On Coreg, statin. May need LFTs checked every 2 months.      Diamox 500mg  iv x1 given per CCS   -recent abg's with improvement Filed Weights   06/14/18 0402 06/15/18 0430 06/16/18 0500  Weight: 132.7 kg (!) 138.7 kg (!) 138.5 kg   Weights appear stable around 138kg    11. Acute on chronic anemia: Improved post transfusion. Now back on ASA--monitor with serial checks.   Hemoglobin 7.9 on 1/15,  recheck Monday 12.Elevated ammonia levels:    Ammonia level 99--->1/16 67---recheck Monday  Continue increased lactulose   13.Multifactorial encephalopathy: appears to be improving with rx of ammonia, CO2, and CHF  -continue to monitor clinically 14. T2DM: Monitor BS ac/hs. Continue Levemir with SSI. Intake variable --will continue to titrate as indicated.  Borderline control, eating more that more alert  Increase levemir to 18u qam 15.  Morbid obesity  Encourage weight loss 16.  Constipation   moving bowels  -sorbitol prn  -lactulose scheduled 17.  Labile blood pressure   Fair control at present  LOS: 7 days A FACE TO FACE EVALUATION WAS PERFORMED  Meredith Staggers 06/16/2018, 8:57 AM

## 2018-06-16 NOTE — Progress Notes (Signed)
   06/16/18 2340  Clinical Encounter Type  Visited With Health care provider  Visit Type Initial;Code  Referral From Nurse  Consult/Referral To Chaplain  The chaplain responded to Code Blue for family member of Pt.  At 2327 the Code was cleared.  The chaplain checked with the RN for opportunities for spiritual care.  The RN responded no other needs at this time.

## 2018-06-16 NOTE — Progress Notes (Signed)
Physical Therapy Session Note  Patient Details  Name: Tamara Silva MRN: [id number] Date of Birth: Dec 25, 1960  Today's Date: 06/16/2018 PT Individual Time: 0800-0900; 1015-1100 PT Individual Time Calculation (min): 60 min and 45 min  Short Term Goals: Week 1:  PT Short Term Goal 1 (Week 1): Pt will perform bed<>WC transfer with mod assist  PT Short Term Goal 2 (Week 1): Pt will consistently participate in therapy with minimal encouragement.  PT Short Term Goal 3 (Week 1): Pt will remain out of bed up to 2 hours at a time.  PT Short Term Goal 4 (Week 1): Pt will perform all bed mobility with min assist.   Skilled Therapeutic Interventions/Progress Updates:    Session 1: Pt received seated EOB, no complaints of pain. Pt reports she has not eaten breakfast yet. Retrieved pt's breakfast tray and she is setup A for eating while seated EOB. Pt requests to use the bathroom. Stand pivot transfer bed to bedside commode with RW and min A, dependent for brief management and setup A for pericare. Pt is able to stand x 5 min with CGA and RW during clothing management and pericare tasks. Stand pivot transfer commode to bed with min A and RW. Once seated EOB pt becomes very emotional and hugs therapist, reports feeling "scared" about her current health situation and requests to don her Bipap machine. RN notified and pt left in care of RN for donning Bipap.  Session 2: Pt received seated in bed asleep with Bipap machine on. Arousable and agreeable with encouragement to participate in therapy session. No complaints of pain. Supine to sit with Supervision. Sit to stand with min A to RW. Ambulation 2 x 20 ft with RW and min A for balance. Pt on 4.5L O2 throughout session, SpO2 92% and above with activity. Seated BLE therex x 10 reps: marches, LAQ, heel/toe raises. Pt requests to don Bipap machine again. RN assist to don Bipap. Sit to supine min A for RLE management. Pt very emotionally labile throughout session  with regards to her health and her significant other not being present. Pt left seated in bed with needs in reach and bed alarm in place.  Therapy Documentation Precautions:  Precautions Precautions: Fall Precaution Comments: Per chart, 4L 02 at baseline Restrictions Weight Bearing Restrictions: No   Therapy/Group: Individual Therapy   Excell Seltzer, PT, DPT  06/16/2018, 12:05 PM

## 2018-06-16 NOTE — Progress Notes (Signed)
Occupational Therapy Session Note  Patient Details  Name: Tamara Silva MRN: [id number] Date of Birth: 1961-01-26  Today's Date: 06/16/2018 OT Individual Time: 6222-9798 OT Individual Time Calculation (min): 24 min   Short Term Goals: Week 1:  OT Short Term Goal 1 (Week 1): Pt will complete BSC transfer with 2 assist and LRAD OT Short Term Goal 2 (Week 1): Pt will complete 1 self care task EOB to increase upright tolerance   Skilled Therapeutic Interventions/Progress Updates:    Pt greeted in bed, refusing to participate in tx. When OT provided gentle encouragement, pt with verbal escalation and teariness. Per pt, "I'm scared- I'm too scared to do this." Provided pt with emotional support. She was adamant that she wanted to just leave the hospital. Educated pt on purpose of CIR and interprofessional therapy participation for achieving this goal. With pt collaboration, OT created positive supportive statements and hung them up on her wall to increase feelings of motivation and self efficacy. We reviewed them together. Afterwards pt requested to sleep and to have her breathing treatment. RN made aware of pts request to notify RT. Pt was left with all needs and bed alarm set. 21 minutes missed due to unwillingness to participate.   Therapy Documentation Precautions:  Precautions Precautions: Fall Precaution Comments: Per chart, 4L 02 at baseline Restrictions Weight Bearing Restrictions: No Pain: No s/s pain during session    ADL: ADL Eating: Not assessed Grooming: Not assessed Upper Body Bathing: Maximal assistance Where Assessed-Upper Body Bathing: Bed level Lower Body Bathing: Other (comment)(2 helpers) Where Assessed-Lower Body Bathing: Bed level Upper Body Dressing: Dependent Where Assessed-Upper Body Dressing: Bed level Lower Body Dressing: Dependent Where Assessed-Lower Body Dressing: Bed level Toileting: Not assessed Toilet Transfer: Not assessed Walk-In Shower Transfer:  Not assessed      Therapy/Group: Individual Therapy  Westly Hinnant A Rufus Cypert 06/16/2018, 1:31 PM

## 2018-06-16 NOTE — Progress Notes (Signed)
Speech Language Pathology Daily Session Note  Patient Details  Name: Tamara Silva MRN: [id number] Date of Birth: 1960/10/08  Today's Date: 06/16/2018 SLP Individual Time: 1415-1500 SLP Individual Time Calculation (min): 45 min  Short Term Goals: Week 2: SLP Short Term Goal 1 (Week 2): Pt will demonstrate sustained attention to functional task in 5 minute intervals with Min A verbal cues. SLP Short Term Goal 2 (Week 2): Given Mod A cues, pt will complete basic familiar tasks related to ADLs.   SLP Short Term Goal 3 (Week 2): Given visual aid, pt will answer orientation questions with supervision cues.  SLP Short Term Goal 4 (Week 2): While looking at a picture, pt will produce simple cohesive sentence about picture with Min A cues on 8 of 10 opportunities.   Skilled Therapeutic Interventions:  Skilled treatment session focused on cognition goals. SLP received pt in bed with BIPAP in place. According to OT/PT notes, pt has requested use of BIPAP frequently and has been tearful during sessions today. Given this, SLP assured pt that she could wear BIPAP during this session. Pt agreeable to session. SLP requested pt's helping in sorting deck of cards. Pt able to sort cards with Min A cues for problem solving and Min A cues for sustained attention (likely d/t distractions of BIPAP). Pt left upright in bed, bed alarm on and all needs within reach. Continue per current plan of care.     Pain Pain Assessment Pain Scale: 0-10 Pain Score: 0-No pain  Therapy/Group: Individual Therapy  Quintara Bost 06/16/2018, 3:23 PM

## 2018-06-17 ENCOUNTER — Inpatient Hospital Stay (HOSPITAL_COMMUNITY): Payer: Self-pay | Admitting: Occupational Therapy

## 2018-06-17 LAB — GLUCOSE, CAPILLARY
Glucose-Capillary: 195 mg/dL — ABNORMAL HIGH (ref 70–99)
Glucose-Capillary: 168 mg/dL — ABNORMAL HIGH (ref 70–99)
Glucose-Capillary: 180 mg/dL — ABNORMAL HIGH (ref 70–99)
Glucose-Capillary: 145 mg/dL — ABNORMAL HIGH (ref 70–99)

## 2018-06-17 NOTE — Progress Notes (Signed)
Occupational Therapy Note  Patient Details  Name: Tamara Silva MRN: [id number] Date of Birth: [birth year]  Today's Date: 06/17/2018 OT Missed Time:  45 min  Missed Time Reason:  fatigue   Missed 45 due to fatigue and wanting to just rest in bed.   Willeen [facility] 06/17/2018, 3:37 PM

## 2018-06-17 NOTE — Progress Notes (Signed)
[facility] PHYSICAL MEDICINE & REHABILITATION PROGRESS NOTE  Subjective/Complaints: Patient sitting up in her bed.  Still connected to her BiPAP.  Stated that she did fairly well last night.  Another family member wandering around in the room  ROS: Patient denies fever, rash, sore throat, blurred vision, nausea, vomiting, diarrhea, cough, shortness of breath or chest pain, joint or back pain, headache, or mood change.    Objective: Vital Signs: Blood pressure 116/64, pulse 74, temperature 98 F (36.7 C), temperature source Oral, resp. rate 19, height 5' 7.5" (1.715 m), weight 134.7 kg, last menstrual period 07/28/2013, SpO2 100 %. No results found. No results for input(s): WBC, HGB, HCT, PLT in the last 72 hours. No results for input(s): NA, K, CL, CO2, GLUCOSE, BUN, CREATININE, CALCIUM in the last 72 hours.  Physical Exam: BP 116/64 (BP Location: Left Arm)   Pulse 74   Temp 98 F (36.7 C) (Oral)   Resp 19   Ht 5' 7.5" (1.715 m)   Wt 134.7 kg   LMP 07/28/2013 Comment: perimenopausal  SpO2 100%   BMI 45.82 kg/m  Constitutional: No distress . Vital signs reviewed.  Obese HEENT: EOMI, oral membranes moist Neck: supple.  Improving phonation Cardiovascular: RRR without murmur. No JVD    Respiratory: Scattered rhonchi, productive cough at times normal effort    GI: BS +, non-tender, non-distended  Musculoskeletal: Edemapresent in bilateral lower extremities.  Neurological:     Alert, oriented to person and place.  Speech quality and volume improved.  Improving trunk control, strength is grossly 3+ to 4 out of 5 in all 4  Skin: Skin iswarm.     Psychiatric:   Alert and cooperative.  Assessment/Plan: 1. Functional deficits secondary to debility which require 3+ hours per day of interdisciplinary therapy in a comprehensive inpatient rehab setting.  Physiatrist is providing close team supervision and 24 hour management of active medical problems listed below.  Physiatrist  and rehab team continue to assess barriers to discharge/monitor patient progress toward functional and medical goals  Care Tool:  Bathing    Body parts bathed by patient: Right arm, Left arm, Chest, Abdomen(UB only)   Body parts bathed by helper: Right arm, Left arm, Chest, Abdomen, Front perineal area, Buttocks, Right upper leg, Left upper leg, Right lower leg, Left lower leg     Bathing assist Assist Level: Set up assist     Upper Body Dressing/Undressing Upper body dressing   What is the patient wearing?: Hospital gown only    Upper body assist Assist Level: Set up assist    Lower Body Dressing/Undressing Lower body dressing      What is the patient wearing?: Pants, Incontinence brief     Lower body assist Assist for lower body dressing: Maximal Assistance - Patient 25 - 49%     Toileting Toileting Toileting Activity did not occur (Clothing management and hygiene only): (Incontinent)  Toileting assist Assist for toileting: 2 Helpers     Transfers Chair/bed transfer  Transfers assist  Chair/bed transfer activity did not occur: Safety/medical concerns  Chair/bed transfer assist level: Minimal Assistance - Patient > 75%     Locomotion Ambulation   Ambulation assist   Ambulation activity did not occur: Safety/medical concerns  Assist level: Minimal Assistance - Patient > 75% Assistive device: Walker-rolling Max distance: 20'   Walk 10 feet activity   Assist  Walk 10 feet activity did not occur: Refused  Assist level: Minimal Assistance - Patient > 75% Assistive device: Walker-rolling  Walk 50 feet activity   Assist Walk 50 feet with 2 turns activity did not occur: Safety/medical concerns         Walk 150 feet activity   Assist Walk 150 feet activity did not occur: Safety/medical concerns         Walk 10 feet on uneven surface  activity   Assist Walk 10 feet on uneven surfaces activity did not occur: Safety/medical concerns          Wheelchair     Assist Will patient use wheelchair at discharge?: Yes Type of Wheelchair: Manual Wheelchair activity did not occur: Safety/medical concerns         Wheelchair 50 feet with 2 turns activity    Assist    Wheelchair 50 feet with 2 turns activity did not occur: Safety/medical concerns       Wheelchair 150 feet activity     Assist Wheelchair 150 feet activity did not occur: Safety/medical concerns          Medical Problem List and Plan: 1.Functional deficitssecondary to metabolic encephalopathy, right occipital-parietal infact and debility after multiple medical issues  -Continue CIR therapies including PT, OT, and SLP     2. DVT Prophylaxis/Anticoagulation: Pharmaceutical:Lovenox 3. Pain Management:Tylenol as needed 4. Mood:LCSW to follow for evaluation and support 5. Neuropsych: This patientis not fullycapable of making decisions on hisown behalf. 6. Skin/Wound Care:Routine pressure relief measures 7. Fluids/Electrolytes/Nutrition:Monitor I/O.    encourage PO 8. COPD with chronic hypoxic respiratory failure: oxygen dependent--uses 4 L at baseline.  Most recent ABG with improvement of PCO2 down to 70.  Bicarb   49.  -Clinically patient appears more alert and lucid  -continue BiPAP  -appreciate pulmonary assistance (signed off)   -Continue with current plan 9. 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  Added nebs prn. 10. Acute on chronic CHF: Low-salt diet. On Coreg, statin. May need LFTs checked every 2 months.      Diamox 500mg  iv x1 given per CCS   -recent abg's with improvement Filed Weights   06/15/18 0430 06/16/18 0500 06/17/18 0225  Weight: (!) 138.7 kg (!) 138.5 kg 134.7 kg   Weights appear stable 1/18    11. Acute on chronic anemia: Improved post transfusion. Now back on ASA--monitor with serial checks.   Hemoglobin 7.9 on 1/15, recheck Monday 12.Elevated ammonia  levels:    Ammonia level 99--->1/16 67---recheck Monday  Continue increased lactulose   13.Multifactorial encephalopathy: appears to be improving with rx of ammonia, CO2, and CHF  -Overall appears improved with steps taken above 14. T2DM: Monitor BS ac/hs. Continue Levemir with SSI. Intake variable --will continue to titrate as indicated.  Borderline control, eating more that more alert  Increase levemir to 18u qam 15.  Morbid obesity  Encourage weight loss 16.  Constipation   moving bowels  -sorbitol prn  -lactulose scheduled--May have to tolerate loose stools somewhat given ammonia levels 17.  Labile blood pressure   Improved control  LOS: 8 days A FACE TO FACE EVALUATION WAS PERFORMED  Meredith Staggers 06/17/2018, 9:22 AM

## 2018-06-17 NOTE — Progress Notes (Signed)
Occupational Therapy Session Note  Patient Details  Name: Xaniyah Buchholz MRN: [id number] Date of Birth: 1960/09/12  Today's Date: 06/17/2018 OT Individual Time: 0940-1040 OT Individual Time Calculation (min): 60 min    Short Term Goals: Week 1:  OT Short Term Goal 1 (Week 1): Pt will complete BSC transfer with 2 assist and LRAD OT Short Term Goal 2 (Week 1): Pt will complete 1 self care task EOB to increase upright tolerance   Skilled Therapeutic Interventions/Progress Updates: Upon approach for therapy, patient was sleeping and wearing CPap.    She stated she had a traumatic experience last night and barely slept.   She spoke of a friend who lives with her and her boyfriend had a seizure last night in her hospital room.  She also stated, "I just do not feel like doing a thing.  I am too tired."    After building rapport, she concurred to at least allowing this clinician to help her set up to wash her face and private area following an urge incontinent episode.      She doffed her CPap (immediately this clinician helped her donn her oxgen tubing) and washed face, neck and hands with setup.    She declined brushing her teeth...stating she was too fatigued.   As well, she stated she was too fatigued to sit Edge of Bed for pericleansing or other higher level therapeutic activity.     She assisted with washing front periarea, and this clinician followed to help with thoroughness due to patient c/o fatigue.    She completed lateral rolls with supervision but stated she was too fatigued to reach behind buttocks to wash there.         For repositioning she required moderate assistance and cues to to bend knees and assist with scooting herself up in the bed.  At end of session call bell was placed within reach, and CPap redonned by patient and this clinician.    She stated she and clinician did not have the CPap on tightly enough.  Respiratory therapist came in and educated patient and this clinician and  nursing on to not loosen velcro straps but instead to unclip side strips, and that way when CPap is donned back on the fit will be appropriately snug for patient to received the CPap benefit and correct amount of oxygen.  Continue patient plan of care     Therapy Documentation Precautions:  Precautions Precautions: Fall Precaution Comments: Per chart, 4L 02 at baseline Restrictions Weight Bearing Restrictions: No   Pain:denied   Therapy/Group: Individual Therapy  Alfredia Ferguson [facility] 06/17/2018, 3:22 PM

## 2018-06-18 ENCOUNTER — Inpatient Hospital Stay (HOSPITAL_COMMUNITY): Payer: Self-pay | Admitting: Occupational Therapy

## 2018-06-18 LAB — GLUCOSE, CAPILLARY
Glucose-Capillary: 149 mg/dL — ABNORMAL HIGH (ref 70–99)
Glucose-Capillary: 225 mg/dL — ABNORMAL HIGH (ref 70–99)
Glucose-Capillary: 194 mg/dL — ABNORMAL HIGH (ref 70–99)
Glucose-Capillary: 174 mg/dL — ABNORMAL HIGH (ref 70–99)

## 2018-06-18 MED ORDER — ACETAZOLAMIDE 250 MG PO TABS
250.0000 mg | ORAL_TABLET | Freq: Once | ORAL | Status: AC
  Administered 2018-06-18: 250 mg via ORAL
  Filled 2018-06-18: qty 1

## 2018-06-18 NOTE — Progress Notes (Signed)
[place] PHYSICAL MEDICINE & REHABILITATION PROGRESS NOTE  Subjective/Complaints: Patient had some issues with her her BiPAP mask last night.  Little more congested  ROS: Patient denies fever, rash, sore throat, blurred vision, nausea, vomiting, diarrhea,     chest pain, joint or back pain, headache, or mood change.   Objective: Vital Signs: Blood pressure 116/60, pulse 70, temperature 97.7 F (36.5 C), temperature source Oral, resp. rate (!) 22, height 5' 7.5" (1.715 m), weight 129.5 kg, last menstrual period 07/28/2013, SpO2 100 %. No results found. No results for input(s): WBC, HGB, HCT, PLT in the last 72 hours. No results for input(s): NA, K, CL, CO2, GLUCOSE, BUN, CREATININE, CALCIUM in the last 72 hours.  Physical Exam: BP 116/60 (BP Location: Left Arm)   Pulse 70   Temp 97.7 F (36.5 C) (Oral)   Resp (!) 22   Ht 5' 7.5" (1.715 m)   Wt 129.5 kg   LMP 07/28/2013 Comment: perimenopausal  SpO2 100%   BMI 44.05 kg/m  Constitutional: No distress . Vital signs reviewed. HEENT: EOMI, oral membranes moist Neck: supple Cardiovascular: RRR without murmur. No JVD    Respiratory: Some scattered rhonchi and perhaps occasional rales at the bases. Normal effort    GI: BS +, non-tender, non-distended  Musculoskeletal: Edemapresent in bilateral lower extremities.  Neurological:     Alert, oriented to person and place.  Speech quality and volume improved.  Improving trunk control, strength is grossly 3+ to 4 out of 5 in all 4 --unchanged Skin: Skin iswarm.     Psychiatric:   A   little anxious today  Assessment/Plan: 1. Functional deficits secondary to debility which require 3+ hours per day of interdisciplinary therapy in a comprehensive inpatient rehab setting.  Physiatrist is providing close team supervision and 24 hour management of active medical problems listed below.  Physiatrist and rehab team continue to assess barriers to discharge/monitor patient progress toward  functional and medical goals  Care Tool:  Bathing  Bathing activity did not occur: Refused(stated she was too fatigued today and completed last night) Body parts bathed by patient: Right arm, Left arm, Chest, Abdomen(UB only)   Body parts bathed by helper: Right arm, Left arm, Chest, Abdomen, Front perineal area, Buttocks, Right upper leg, Left upper leg, Right lower leg, Left lower leg     Bathing assist Assist Level: Set up assist     Upper Body Dressing/Undressing Upper body dressing   What is the patient wearing?: Hospital gown only(had no clean clothes here)    Upper body assist Assist Level: Minimal Assistance - Patient > 75%    Lower Body Dressing/Undressing Lower body dressing      What is the patient wearing?: Hospital gown only     Lower body assist Assist for lower body dressing: Maximal Assistance - Patient 25 - 49%     Toileting Toileting Toileting Activity did not occur (Clothing management and hygiene only): (Incontinent)  Toileting assist Assist for toileting: 2 Helpers     Transfers Chair/bed transfer  Transfers assist  Chair/bed transfer activity did not occur: Safety/medical concerns  Chair/bed transfer assist level: Minimal Assistance - Patient > 75%     Locomotion Ambulation   Ambulation assist   Ambulation activity did not occur: Safety/medical concerns  Assist level: Minimal Assistance - Patient > 75% Assistive device: Walker-rolling Max distance: 20'   Walk 10 feet activity   Assist  Walk 10 feet activity did not occur: Refused  Assist level: Minimal Assistance -  Patient > 75% Assistive device: Walker-rolling   Walk 50 feet activity   Assist Walk 50 feet with 2 turns activity did not occur: Safety/medical concerns         Walk 150 feet activity   Assist Walk 150 feet activity did not occur: Safety/medical concerns         Walk 10 feet on uneven surface  activity   Assist Walk 10 feet on uneven surfaces  activity did not occur: Safety/medical concerns         Wheelchair     Assist Will patient use wheelchair at discharge?: Yes Type of Wheelchair: Manual Wheelchair activity did not occur: Safety/medical concerns         Wheelchair 50 feet with 2 turns activity    Assist    Wheelchair 50 feet with 2 turns activity did not occur: Safety/medical concerns       Wheelchair 150 feet activity     Assist Wheelchair 150 feet activity did not occur: Safety/medical concerns          Medical Problem List and Plan: 1.Functional deficitssecondary to metabolic encephalopathy, right occipital-parietal infact and debility after multiple medical issues  -Continue CIR therapies including PT, OT, and SLP    -Discussed goals with patient today 2. DVT Prophylaxis/Anticoagulation: Pharmaceutical:Lovenox 3. Pain Management:Tylenol as needed 4. Mood:LCSW to follow for evaluation and support 5. Neuropsych: This patientis not fullycapable of making decisions on hisown behalf. 6. Skin/Wound Care:Routine pressure relief measures 7. Fluids/Electrolytes/Nutrition:Monitor I/O.    encourage PO 8. COPD with chronic hypoxic respiratory failure: oxygen dependent--uses 4 L at baseline.  Most recent ABG with improvement of PCO2 down to 70.  Bicarb   49.  -Clinically patient appears more alert and lucid  -continue BiPAP  -appreciate pulmonary assistance (signed off)   -See below 9. 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  Added nebs prn. 10. Acute on chronic CHF: Low-salt diet. On Coreg, statin. May need LFTs checked every 2 months.      Diamox 500mg  iv x1 given per [facility]CS   -recent abg's with improvement Filed Weights   06/16/18 0500 06/17/18 0225 06/18/18 0410  Weight: (!) 138.5 kg 134.7 kg 129.5 kg   Weights although inconsistent appears generally stable  -Patient does sound a bit wider today.  -We will give her a dose of oral  Diamox, 250mg   -Recheck lab work tomorrow 11. Acute on chronic anemia: Improved post transfusion. Now back on ASA--monitor with serial checks.   Hemoglobin 7.9 on 1/15, recheck Monday 12.Elevated ammonia levels:    Ammonia level 99--->1/16 67---recheck Monday  Continue increased lactulose   13.Multifactorial encephalopathy: appears to be improving with rx of ammonia, CO2, and CHF  -Overall appears improved with steps taken above 14. T2DM: Monitor BS ac/hs. Continue Levemir with SSI. Intake variable --will continue to titrate as indicated.   eating more that more alert, noncompliant with diet however  Increased levemir to 18u qam with some improvement 15.  Morbid obesity  Encourage weight loss 16.  Constipation   moving bowels  -sorbitol prn  -lactulose scheduled--May have to tolerate loose stools somewhat given ammonia levels 17.  Labile blood pressure   Improved control  LOS: 9 days A FACE TO FACE EVALUATION WAS PERFORMED  Meredith Staggers 06/18/2018, 9:12 AM

## 2018-06-18 NOTE — Progress Notes (Signed)
Perseverated on Bipap mask not fitting right. RT up several times and staff adjusted. O2 at 4L/M Congested and productive cough. Slept from 2400-0300, incontinent of urine, fell back asleep at 0430. Abd. Distended with hyperactive BS. Wore prevalon boots until 0300. Non-compliant with diet. Patrici Ranks A

## 2018-06-18 NOTE — Progress Notes (Signed)
Occupational Therapy Session Note  Patient Details  Name: Naavya Postma MRN: [id number] Date of Birth: June 27, 1960  Today's Date: 06/18/2018 OT Missed Time: 26 Minutes Missed Time Reason: Patient unwilling/refused to participate without medical reason  Short Term Goals: Week 1:  OT Short Term Goal 1 (Week 1): Pt will complete BSC transfer with 2 assist and LRAD OT Short Term Goal 2 (Week 1): Pt will complete 1 self care task EOB to increase upright tolerance   Skilled Therapeutic Interventions/Progress Updates:    Pt seen for OT and refused to get OOB to participate. Pt opening her eyes to look at OT, but then closed them and would not converse when provided encouragement to engage in tx. When OT arrived later in afternoon to see pt, she was on BiPap. 60 minutes missed.   Therapy Documentation Precautions:  Precautions Precautions: Fall Precaution Comments: Per chart, 4L 02 at baseline Restrictions Weight Bearing Restrictions: No Pain: No s/s pain    ADL: ADL Eating: Not assessed Grooming: Not assessed Upper Body Bathing: Maximal assistance Where Assessed-Upper Body Bathing: Bed level Lower Body Bathing: Other (comment)(2 helpers) Where Assessed-Lower Body Bathing: Bed level Upper Body Dressing: Dependent Where Assessed-Upper Body Dressing: Bed level Lower Body Dressing: Dependent Where Assessed-Lower Body Dressing: Bed level Toileting: Not assessed Toilet Transfer: Not assessed Walk-In Shower Transfer: Not assessed      Therapy/Group: Individual Therapy  Elden Brucato A Markeshia Giebel 06/18/2018, 4:13 PM

## 2018-06-19 ENCOUNTER — Inpatient Hospital Stay (HOSPITAL_COMMUNITY): Payer: Self-pay | Admitting: Physical Therapy

## 2018-06-19 ENCOUNTER — Inpatient Hospital Stay (HOSPITAL_COMMUNITY): Payer: Self-pay | Admitting: Occupational Therapy

## 2018-06-19 ENCOUNTER — Inpatient Hospital Stay (HOSPITAL_COMMUNITY): Payer: Self-pay

## 2018-06-19 LAB — GLUCOSE, CAPILLARY
Glucose-Capillary: 146 mg/dL — ABNORMAL HIGH (ref 70–99)
Glucose-Capillary: 201 mg/dL — ABNORMAL HIGH (ref 70–99)
Glucose-Capillary: 189 mg/dL — ABNORMAL HIGH (ref 70–99)
Glucose-Capillary: 133 mg/dL — ABNORMAL HIGH (ref 70–99)

## 2018-06-19 LAB — BASIC METABOLIC PANEL
Anion gap: 7 (ref 5–15)
BUN: 14 mg/dL (ref 6–20)
CO2: 41 mmol/L — ABNORMAL HIGH (ref 22–32)
Calcium: 8.8 mg/dL — ABNORMAL LOW (ref 8.9–10.3)
Chloride: 89 mmol/L — ABNORMAL LOW (ref 98–111)
Creatinine, Ser: 0.93 mg/dL (ref 0.44–1.00)
GFR calc Af Amer: >60 mL/min (ref >60)
GFR calc non Af Amer: >60 mL/min (ref >60)
Glucose, Bld: 168 mg/dL — ABNORMAL HIGH (ref 70–99)
Potassium: 3.7 mmol/L (ref 3.5–5.1)
Sodium: 137 mmol/L (ref 135–145)

## 2018-06-19 LAB — BLOOD GAS, ARTERIAL
Acid-Base Excess: 17.5 mmol/L — ABNORMAL HIGH (ref 0.0–2.0)
Bicarbonate: 43.8 mmol/L — ABNORMAL HIGH (ref 20.0–28.0)
Delivery systems: POSITIVE
Drawn by: 54717
Expiratory PAP: 5
Inspiratory PAP: 18
O2 Saturation: 96.3 %
Patient temperature: 98.6
pCO2 arterial: 78.4 mmHg (ref 32.0–48.0)
pH, Arterial: 7.366 (ref 7.350–7.450)
pO2, Arterial: 73.4 mmHg — ABNORMAL LOW (ref 83.0–108.0)

## 2018-06-19 LAB — CBC
HCT: 26.9 % — ABNORMAL LOW (ref 36.0–46.0)
Hemoglobin: 7.1 g/dL — ABNORMAL LOW (ref 12.0–15.0)
MCH: 22.9 pg — ABNORMAL LOW (ref 26.0–34.0)
MCHC: 26.4 g/dL — ABNORMAL LOW (ref 30.0–36.0)
MCV: 86.8 fL (ref 80.0–100.0)
Platelets: 214 10*3/uL (ref 150–400)
RBC: 3.1 MIL/uL — ABNORMAL LOW (ref 3.87–5.11)
WBC: 8 10*3/uL (ref 4.0–10.5)
nRBC: 0.6 % — ABNORMAL HIGH (ref 0.0–0.2)

## 2018-06-19 LAB — AMMONIA: Ammonia: 71 umol/L — ABNORMAL HIGH (ref 9–35)

## 2018-06-19 NOTE — Progress Notes (Signed)
Occupational Therapy Weekly Progress Note  Patient Details  Name: Saragrace Selke MRN: [id number] Date of Birth: 04/09/1961  Beginning of progress report period: 06/10/18 End of progress report period: 06/19/18  Today's Date: 06/19/2018 OT Individual Time: 1610-1640 OT Individual Time Calculation (min): 30 min  30 minutes missed   Patient has met 2 of 2 short term goals.    Pt has made her STGs during this report period. OT participation has been limited due to several pt refusals. Though provided with encouragement, she reports to either have too much fatigue or just doesn't want to leave bed. Pt is essentially at goal level. Will initiate caregiver education during next report period for safe transition home.   Patient continues to demonstrate the following deficits: muscle weakness, decreased cardiorespiratoy endurance, decreased initiation, decreased attention, decreased awareness, decreased problem solving and decreased safety awareness and decreased sitting balance and decreased standing balance and therefore will continue to benefit from skilled OT intervention to enhance overall performance with BADL.  Patient progressing toward long term goals..  Continue plan of care.  OT Short Term Goals Week 1:  OT Short Term Goal 1 (Week 1): (per caretool) OT Short Term Goal 1 - Progress (Week 1): Met OT Short Term Goal 2 (Week 1): Pt will complete 1 self care task EOB to increase upright tolerance  OT Short Term Goal 2 - Progress (Week 1): Met Week 2:  OT Short Term Goal 1 (Week 2): STGs=LTGs due to ELOS   Skilled Therapeutic Interventions/Progress Updates:   Pt greeted in bed for scheduled tx, refusing tx due to not wanting to get OOB. Later in afternoon, pt agreeable to OT with encouragement. Steady assist supine<sit. Mod A sit<stand and pt completed stand step transfer to [facility] using RW with steady assist. Pt with incontinent of BM in brief, and voided bladder while on BSC. She stood for 4  minutes(!) for OT to complete perihygiene and for OT+spouse to don clean brief. Once back EOB, she requested to have her breathing tx. RN in at this time to assist OT with setup. Pt needed a few minutes to gather herself EOB due to SOB, and then spouse assisted her with transition to supine. Spouse and OT boosted pt up in bed. Pt was left with all needs within reach, bed alarm set, and spouse present. Tx focus today placed on standing balance, functional transfers, and endurance.   30 minutes missed due to pt fatigue   Therapy Documentation Precautions:  Precautions Precautions: Fall Precaution Comments: Per chart, 4L 02 at baseline Restrictions Weight Bearing Restrictions: No Vital Signs: Therapy Vitals Temp: 98.5 F (36.9 C) Pulse Rate: 67 Resp: 18 BP: 116/61 Patient Position (if appropriate): Lying Oxygen Therapy SpO2: 100 % O2 Device: Room Air Pain: No s/s pain during tx    ADL: ADL Eating: Not assessed Grooming: Not assessed Upper Body Bathing: Maximal assistance Where Assessed-Upper Body Bathing: Bed level Lower Body Bathing: Other (comment)(2 helpers) Where Assessed-Lower Body Bathing: Bed level Upper Body Dressing: Dependent Where Assessed-Upper Body Dressing: Bed level Lower Body Dressing: Dependent Where Assessed-Lower Body Dressing: Bed level Toileting: Not assessed Toilet Transfer: Not assessed Walk-In Shower Transfer: Not assessed      Therapy/Group: Individual Therapy  Niobe Dick A Melinda Pottinger 06/19/2018, 7:17 AM

## 2018-06-19 NOTE — Progress Notes (Signed)
Rested better tonight. Continues to be noncompliant with diet. Incontinent of urine, doesn't call when wet. Congested cough. Bipap with 4liter of O2 bled in.Patrici Ranks A

## 2018-06-19 NOTE — Progress Notes (Signed)
Physical Therapy Weekly Progress Note  Patient Details  Name: Tamara Silva MRN: [id number] Date of Birth: 10-09-1960  Beginning of progress report period: June 10, 2017 End of progress report period: June 19, 2017  Today's Date: 06/19/2018 PT Individual Time: 0800-0915 PT Individual Time Calculation (min): 75 min   Patient has met 2 of 4 short term goals.  Pt has been limited by ongoing medical issues with regards to ABGs as well as limited motivation. Pt requires significant encouragement and rapport-building for participation in therapy sessions. Pt is able to complete bed mobility and transfers at min A level with RW at this time. Pt is able to ambulate up to 30 ft with RW and min A. Pt has tolerated sitting up out of bed x 1 hour but has not been consistent with staying out of bed between therapy sessions.  Patient continues to demonstrate the following deficits muscle weakness, decreased cardiorespiratoy endurance, decreased initiation and delayed processing and decreased standing balance and decreased balance strategies and therefore will continue to benefit from skilled PT intervention to increase functional independence with mobility.  Patient progressing toward long term goals..  Continue plan of care.  PT Short Term Goals Week 1:  PT Short Term Goal 1 (Week 1): Pt will perform bed<>WC transfer with mod assist  PT Short Term Goal 1 - Progress (Week 1): Met PT Short Term Goal 2 (Week 1): Pt will consistently participate in therapy with minimal encouragement.  PT Short Term Goal 2 - Progress (Week 1): Progressing toward goal PT Short Term Goal 3 (Week 1): Pt will remain out of bed up to 2 hours at a time.  PT Short Term Goal 3 - Progress (Week 1): Progressing toward goal PT Short Term Goal 4 (Week 1): Pt will perform all bed mobility with min assist.  PT Short Term Goal 4 - Progress (Week 1): Met Week 2:  PT Short Term Goal 1 (Week 2): Pt will tolerate sitting OOB x 2  hours PT Short Term Goal 2 (Week 2): Pt will perform all transfers and bed mobility with Supervision with LRAD PT Short Term Goal 3 (Week 2): Pt will ambulate x 50 ft with LRAD and CGA  Skilled Therapeutic Interventions/Progress Updates:    Pt received seated in bed asleep with Bipap machine. Arousable and agreeable to participate in therapy session, no complaints of pain. Supine to sit with CGA. Sit to stand with CGA to min A to RW throughout therapy session. Stand pivot transfer bed to w/c with RW and CGA. Ambulation 2 x 30 ft with RW and CGA to min A. Pt on 4L O2 throughout therapy session, SpO2 99% with activity. Pt requests to use the bathroom. Commode transfer with RW and min A. Pt is dependent for clothing management and max A for pericare. Stand pivot transfer commode to bed with CGA and RW. Pt left seated EOB with needs in reach, significant other present.  Therapy Documentation Precautions:  Precautions Precautions: Fall Precaution Comments: Per chart, 4L 02 at baseline Restrictions Weight Bearing Restrictions: No   Therapy/Group: Individual Therapy   Excell Seltzer, PT, DPT  06/19/2018, 9:13 AM

## 2018-06-19 NOTE — Progress Notes (Signed)
Speech Language Pathology Daily Session Note  Patient Details  Name: Tamara Silva MRN: [id number] Date of Birth: June 08, 1960  Today's Date: 06/19/2018 SLP Individual Time: 1000-1100 SLP Individual Time Calculation (min): 60 min  Short Term Goals: Week 2: SLP Short Term Goal 1 (Week 2): Pt will demonstrate sustained attention to functional task in 5 minute intervals with Min A verbal cues. SLP Short Term Goal 2 (Week 2): Given Mod A cues, pt will complete basic familiar tasks related to ADLs.   SLP Short Term Goal 3 (Week 2): Given visual aid, pt will answer orientation questions with supervision cues.  SLP Short Term Goal 4 (Week 2): While looking at a picture, pt will produce simple cohesive sentence about picture with Min A cues on 8 of 10 opportunities.   Skilled Therapeutic Interventions:Skilled ST services focused on cognitive skills. SLP facilitated orientation to place, time and situation, pt required supervision A verbal cues for external aid. SLP facilitated basic problem solving skills utilizing card task to sort by five colors, pt required extra time and demonstrated mod I. Pt demonstrated sustained attention in 10 minute intervals with min A verbal cues.  Pt demonstrated thought organization, creating sentences pertaining to picture cards in description task, pt required min A verbal in 7 out 10 opportunities. Pt stated she came from a family with a total of 12 sibling, however given pen  And paper only able to list 11 and became frustrated with the task requiring emotional support. PT requested to use the bedpan, SLP assisted and was left in the room with NT . Pt was left in room with call bell within reach and bed alarm set. Recommend to continue skilled ST services.     Pain Pain Assessment Pain Score: 0-No pain  Therapy/Group: Individual Therapy  Aurelio Mccamy  [facility] 06/19/2018, 4:22 PM

## 2018-06-19 NOTE — Progress Notes (Signed)
[place] PHYSICAL MEDICINE & REHABILITATION PROGRESS NOTE  Subjective/Complaints: Pt with good night per rn. Hard to awaken this morning. Therapy trying to get her going  ROS: Limited due to cognitive/behavioral   Objective: Vital Signs: Blood pressure 116/61, pulse 67, temperature 98.5 F (36.9 C), resp. rate 18, height 5' 7.5" (1.715 m), weight (!) 140.6 kg, last menstrual period 07/28/2013, SpO2 100 %. No results found. Recent Labs    06/19/18 0653  WBC 8.0  HGB 7.1*  HCT 26.9*  PLT 214   Recent Labs    06/19/18 0653  NA 137  K 3.7  CL 89*  CO2 41*  GLUCOSE 168*  BUN 14  CREATININE 0.93  CALCIUM 8.8*    Physical Exam: BP 116/61 (BP Location: Right Arm)   Pulse 67   Temp 98.5 F (36.9 C)   Resp 18   Ht 5' 7.5" (1.715 m)   Wt (!) 140.6 kg   LMP 07/28/2013 Comment: perimenopausal  SpO2 100%   BMI 47.83 kg/m  Constitutional: No distress . Vital signs reviewed. HEENT: EOMI, oral membranes moist Neck: supple Cardiovascular: RRR without murmur. No JVD    Respiratory: scattered rhonchi, crackles at times. Normal effort    GI: BS +, non-tender, non-distended  Musculoskeletal: Edemapresent in bilateral lower extremities.  Neurological:     Slow to awaken. [name] Kitchen   strength is grossly 3+ to 4 out of 5 in all 4   Skin: Skin iswarm.     Psychiatric:   flat  Assessment/Plan: 1. Functional deficits secondary to debility which require 3+ hours per day of interdisciplinary therapy in a comprehensive inpatient rehab setting.  Physiatrist is providing close team supervision and 24 hour management of active medical problems listed below.  Physiatrist and rehab team continue to assess barriers to discharge/monitor patient progress toward functional and medical goals  Care Tool:  Bathing  Bathing activity did not occur: Refused(stated she was too fatigued today and completed last night) Body parts bathed by patient: Right arm, Left arm, Chest, Abdomen(UB only)    Body parts bathed by helper: Right arm, Left arm, Chest, Abdomen, Front perineal area, Buttocks, Right upper leg, Left upper leg, Right lower leg, Left lower leg     Bathing assist Assist Level: Set up assist     Upper Body Dressing/Undressing Upper body dressing   What is the patient wearing?: Hospital gown only(had no clean clothes here)    Upper body assist Assist Level: Minimal Assistance - Patient > 75%    Lower Body Dressing/Undressing Lower body dressing      What is the patient wearing?: Hospital gown only     Lower body assist Assist for lower body dressing: Maximal Assistance - Patient 25 - 49%     Toileting Toileting Toileting Activity did not occur (Clothing management and hygiene only): (Incontinent)  Toileting assist Assist for toileting: 2 Helpers     Transfers Chair/bed transfer  Transfers assist  Chair/bed transfer activity did not occur: Safety/medical concerns  Chair/bed transfer assist level: Minimal Assistance - Patient > 75%     Locomotion Ambulation   Ambulation assist   Ambulation activity did not occur: Safety/medical concerns  Assist level: Minimal Assistance - Patient > 75% Assistive device: Walker-rolling Max distance: 20'   Walk 10 feet activity   Assist  Walk 10 feet activity did not occur: Refused  Assist level: Minimal Assistance - Patient > 75% Assistive device: Walker-rolling   Walk 50 feet activity   Assist Walk 50  feet with 2 turns activity did not occur: Safety/medical concerns         Walk 150 feet activity   Assist Walk 150 feet activity did not occur: Safety/medical concerns         Walk 10 feet on uneven surface  activity   Assist Walk 10 feet on uneven surfaces activity did not occur: Safety/medical concerns         Wheelchair     Assist Will patient use wheelchair at discharge?: Yes Type of Wheelchair: Manual Wheelchair activity did not occur: Safety/medical concerns          Wheelchair 50 feet with 2 turns activity    Assist    Wheelchair 50 feet with 2 turns activity did not occur: Safety/medical concerns       Wheelchair 150 feet activity     Assist Wheelchair 150 feet activity did not occur: Safety/medical concerns          Medical Problem List and Plan: 1.Functional deficitssecondary to metabolic encephalopathy, right occipital-parietal infact and debility after multiple medical issues  -Continue CIR therapies including PT, OT, and SLP      2. DVT Prophylaxis/Anticoagulation: Pharmaceutical:Lovenox 3. Pain Management:Tylenol as needed 4. Mood:LCSW to follow for evaluation and support 5. Neuropsych: This patientis not fullycapable of making decisions on hisown behalf. 6. Skin/Wound Care:Routine pressure relief measures 7. Fluids/Electrolytes/Nutrition:Monitor I/O.    encourage PO 8. COPD with chronic hypoxic respiratory failure: oxygen dependent--uses 4 L at baseline.  -Today's ABG reviewed and essentially stable   -continue BiPAP  -appreciate pulmonary assistance (signed off)    9. 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  Added nebs prn. 10. Acute on chronic CHF: Low-salt diet. On Coreg, statin. May need LFTs checked every 2 months.         Filed Weights   06/17/18 0225 06/18/18 0410 06/19/18 0546  Weight: 134.7 kg 129.5 kg (!) 140.6 kg   Weights are unreliable  -Patient does sound a bit wider today.  -  oral Diamox, 250mg  x1 yesterday  - 11. Acute on chronic anemia: Improved post transfusion. Now back on ASA--monitor with serial checks.   Hemoglobin 7.9 on 1/15, down to 7.1 1/20, recheck tomorrow again before addressing more aggressively 12.Elevated ammonia levels:    Ammonia level 99--->1/16 67---71 1/20  Continue increased lactulose   13.Multifactorial encephalopathy: appears to be improving with rx of ammonia, CO2, and CHF  -can wax and wane 14. T2DM:  Monitor BS ac/hs. Continue Levemir with SSI. Intake variable --will continue to titrate as indicated.   eating more that more alert, noncompliant with diet however  Increased levemir to 18u qam with some improvement 15.  Morbid obesity  Encourage weight loss 16.  Constipation   moving bowels  -sorbitol prn  -lactulose scheduled--May have to tolerate loose stools somewhat given ammonia levels 17.  Labile blood pressure   Improved control  LOS: 10 days A FACE TO FACE EVALUATION WAS PERFORMED  Meredith Staggers 06/19/2018, 8:58 AM

## 2018-06-19 NOTE — Progress Notes (Addendum)
Pt already on BIPAP dream station when RT arrived to place pt on (18/5 with 4 Lpm bled into the system).  Pt resting comfortably. RT checked H2O levels not in need of any at this time. RT will continue to monitor.

## 2018-06-20 ENCOUNTER — Inpatient Hospital Stay (HOSPITAL_COMMUNITY): Payer: Self-pay | Admitting: Occupational Therapy

## 2018-06-20 ENCOUNTER — Encounter (HOSPITAL_COMMUNITY): Payer: Self-pay | Admitting: Psychology

## 2018-06-20 ENCOUNTER — Inpatient Hospital Stay (HOSPITAL_COMMUNITY): Payer: Self-pay

## 2018-06-20 ENCOUNTER — Inpatient Hospital Stay (HOSPITAL_COMMUNITY): Payer: Self-pay | Admitting: Physical Therapy

## 2018-06-20 LAB — GLUCOSE, CAPILLARY
Glucose-Capillary: 124 mg/dL — ABNORMAL HIGH (ref 70–99)
Glucose-Capillary: 215 mg/dL — ABNORMAL HIGH (ref 70–99)
Glucose-Capillary: 201 mg/dL — ABNORMAL HIGH (ref 70–99)
Glucose-Capillary: 111 mg/dL — ABNORMAL HIGH (ref 70–99)

## 2018-06-20 LAB — CBC
HCT: 28.4 % — ABNORMAL LOW (ref 36.0–46.0)
Hemoglobin: 7.3 g/dL — ABNORMAL LOW (ref 12.0–15.0)
MCH: 22.5 pg — ABNORMAL LOW (ref 26.0–34.0)
MCHC: 25.7 g/dL — ABNORMAL LOW (ref 30.0–36.0)
MCV: 87.4 fL (ref 80.0–100.0)
Platelets: 235 10*3/uL (ref 150–400)
RBC: 3.25 MIL/uL — ABNORMAL LOW (ref 3.87–5.11)
WBC: 8.5 10*3/uL (ref 4.0–10.5)
nRBC: 0.7 % — ABNORMAL HIGH (ref 0.0–0.2)

## 2018-06-20 NOTE — Progress Notes (Signed)
[place] PHYSICAL MEDICINE & REHABILITATION PROGRESS NOTE  Subjective/Complaints: Pt up sitting at eob eating breakfast. Had a reasonable night. Feels "ok" today. Tech couldn't get blood from arm this morning  ROS: Patient denies fever, rash, sore throat, blurred vision, nausea, vomiting, diarrhea, cough,  chest pain, joint or back pain, headache, or mood change.   Objective: Vital Signs: Blood pressure 125/63, pulse 68, temperature (!) 97.5 F (36.4 C), resp. rate 18, height 5' 7.5" (1.715 m), weight (!) 139.4 kg, last menstrual period 07/28/2013, SpO2 100 %. No results found. Recent Labs    06/19/18 0653  WBC 8.0  HGB 7.1*  HCT 26.9*  PLT 214   Recent Labs    06/19/18 0653  NA 137  K 3.7  CL 89*  CO2 41*  GLUCOSE 168*  BUN 14  CREATININE 0.93  CALCIUM 8.8*    Physical Exam: BP 125/63 (BP Location: Right Arm)   Pulse 68   Temp (!) 97.5 F (36.4 C)   Resp 18   Ht 5' 7.5" (1.715 m)   Wt (!) 139.4 kg   LMP 07/28/2013 Comment: perimenopausal  SpO2 100%   BMI 47.42 kg/m  Constitutional: No distress . Vital signs reviewed. HEENT: EOMI, oral membranes moist Neck: supple Cardiovascular: RRR without murmur. No JVD    Respiratory: scattered rhonchi, occ wheezes    GI: BS +, non-tender, non-distended  Musculoskeletal: Edemapresent in bilateral lower extremities.  Neurological:     Alert, talkative, fair insight. Good sitting balance.  strength is grossly 3+ to 4 out of 5 in all 4   Skin: Skin iswarm.     Psychiatric:   flat  Assessment/Plan: 1. Functional deficits secondary to debility which require 3+ hours per day of interdisciplinary therapy in a comprehensive inpatient rehab setting.  Physiatrist is providing close team supervision and 24 hour management of active medical problems listed below.  Physiatrist and rehab team continue to assess barriers to discharge/monitor patient progress toward functional and medical goals  Care Tool:  Bathing  Bathing activity did not occur: Refused(stated she was too fatigued today and completed last night) Body parts bathed by patient: Right arm, Left arm, Chest, Abdomen(UB only)   Body parts bathed by helper: Right arm, Left arm, Chest, Abdomen, Front perineal area, Buttocks, Right upper leg, Left upper leg, Right lower leg, Left lower leg     Bathing assist Assist Level: Set up assist     Upper Body Dressing/Undressing Upper body dressing   What is the patient wearing?: Hospital gown only(had no clean clothes here)    Upper body assist Assist Level: Minimal Assistance - Patient > 75%    Lower Body Dressing/Undressing Lower body dressing      What is the patient wearing?: Hospital gown only     Lower body assist Assist for lower body dressing: Maximal Assistance - Patient 25 - 49%     Toileting Toileting Toileting Activity did not occur (Clothing management and hygiene only): (Incontinent)  Toileting assist Assist for toileting: Total Assistance - Patient < 25%     Transfers Chair/bed transfer  Transfers assist  Chair/bed transfer activity did not occur: Safety/medical concerns  Chair/bed transfer assist level: Minimal Assistance - Patient > 75%     Locomotion Ambulation   Ambulation assist   Ambulation activity did not occur: Safety/medical concerns  Assist level: Minimal Assistance - Patient > 75% Assistive device: Walker-rolling Max distance: 30'   Walk 10 feet activity   Assist  Walk 10 feet  activity did not occur: Refused  Assist level: Minimal Assistance - Patient > 75% Assistive device: Walker-rolling   Walk 50 feet activity   Assist Walk 50 feet with 2 turns activity did not occur: Safety/medical concerns         Walk 150 feet activity   Assist Walk 150 feet activity did not occur: Safety/medical concerns         Walk 10 feet on uneven surface  activity   Assist Walk 10 feet on uneven surfaces activity did not occur:  Safety/medical concerns         Wheelchair     Assist Will patient use wheelchair at discharge?: Yes Type of Wheelchair: Manual Wheelchair activity did not occur: Safety/medical concerns         Wheelchair 50 feet with 2 turns activity    Assist    Wheelchair 50 feet with 2 turns activity did not occur: Safety/medical concerns       Wheelchair 150 feet activity     Assist Wheelchair 150 feet activity did not occur: Safety/medical concerns          Medical Problem List and Plan: 1.Functional deficitssecondary to metabolic encephalopathy, right occipital-parietal infact and debility after multiple medical issues  -Continue CIR therapies including PT, OT, and SLP     --Interdisciplinary Team Conference today   2. DVT Prophylaxis/Anticoagulation: Pharmaceutical:Lovenox 3. Pain Management:Tylenol as needed 4. Mood:LCSW to follow for evaluation and support 5. Neuropsych: This patientis not fullycapable of making decisions on hisown behalf. 6. Skin/Wound Care:Routine pressure relief measures 7. Fluids/Electrolytes/Nutrition:Monitor I/O.    encourage PO 8. COPD with chronic hypoxic respiratory failure: oxygen dependent--uses 4 L at baseline.  -yesteredays ABG  essentially stable   -continue BiPAP  -appreciate pulmonary assistance (signed off)    9. 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  Added nebs prn. 10. Acute on chronic CHF: Low-salt diet. On Coreg, statin. May need LFTs checked every 2 months.         Filed Weights   06/18/18 0410 06/19/18 0546 06/20/18 0500  Weight: 129.5 kg (!) 140.6 kg (!) 139.4 kg   Weights are unreliable  -Patient does sound a bit wider today.  -  oral Diamox, 250mg  x1 1/19  - 11. Acute on chronic anemia: Improved post transfusion. Now back on ASA--monitor with serial checks.   Hemoglobin 7.9 on 1/15, down to 7.1 1/20---awaiting today's lab 12.Elevated ammonia levels:     Ammonia level 99--->1/16 67---71 1/20  Continue increased lactulose   13.Multifactorial encephalopathy: appears to be improving with rx of ammonia, CO2, and CHF  -can wax and wane 14. T2DM: Monitor BS ac/hs. Continue Levemir with SSI. Intake variable --will continue to titrate as indicated.   eating more that more alert, noncompliant with diet however  Increased levemir to 18u qam with some improvement 15.  Morbid obesity  Encourage weight loss 16.  Constipation   moving bowels  -sorbitol prn  -lactulose scheduled-- tolerate loose stools somewhat given ammonia levels 17.  Labile blood pressure   Improved control  LOS: 11 days A FACE TO FACE EVALUATION WAS PERFORMED  Meredith Staggers 06/20/2018, 8:55 AM

## 2018-06-20 NOTE — Progress Notes (Signed)
Pt was advised to not keep bipap mask so tight around her face, pt stated ' I need it tight I don't like that sound" pt educated to why the mask is making the noise. Patient states that she likes it tight but will try it as it is adjusted at present.

## 2018-06-20 NOTE — Progress Notes (Signed)
Occupational Therapy Session Note  Patient Details  Name: Tamara Silva MRN: [id number] Date of Birth: 1961-01-02  Today's Date: 06/20/2018 OT Individual Time: 1300-1309 OT Individual Time Calculation (min): 9 min  and Today's Date: 06/20/2018 OT Missed Time: 30 Minutes Missed Time Reason: Patient unwilling/refused to participate without medical reason   Short Term Goals: Week 2:  OT Short Term Goal 1 (Week 2): STGs=LTGs due to ELOS  Skilled Therapeutic Interventions/Progress Updates:    Upon entering the room, pt supine in bed with bipap on. Caregiver present in the room as well. Pt shaking head "no" she was not agreeable to therapy or exiting the bed for session. Pt declined activities from bed level as well. Caregiver requesting information on upcoming discharge date and therapist educated pt and caregiver on team conference this afternoon. RN notified of pt refusal but pt denies pain at this time. Bed alarm activated and call bell within reach.   Therapy Documentation Precautions:  Precautions Precautions: Fall Precaution Comments: Per chart, 4L 02 at baseline Restrictions Weight Bearing Restrictions: No General: General OT Amount of Missed Time: 72 Minutes PT Missed Treatment Reason: Patient ill (Comment);Patient unwilling to participate(nausea) Vital Signs:  Pain: Pain Assessment Pain Scale: 0-10 Pain Score: 0-No pain ADL: ADL Eating: Not assessed Grooming: Not assessed Upper Body Bathing: Maximal assistance Where Assessed-Upper Body Bathing: Bed level Lower Body Bathing: Other (comment)(2 helpers) Where Assessed-Lower Body Bathing: Bed level Upper Body Dressing: Dependent Where Assessed-Upper Body Dressing: Bed level Lower Body Dressing: Dependent Where Assessed-Lower Body Dressing: Bed level Toileting: Not assessed Toilet Transfer: Not assessed Walk-In Shower Transfer: Not assessed   Therapy/Group: Individual Therapy  Gypsy Decant 06/20/2018, 1:55  PM

## 2018-06-20 NOTE — Progress Notes (Signed)
RT placed patient on bipap 18/5 with 4L bled in. Patient appears comfortable at this time.

## 2018-06-20 NOTE — Progress Notes (Signed)
RT called to pt room to replace BIPAP mask d/t pt tugging and tightening has broken straps on her current one. RT explained to the pt that she will get a pressure ulcer if she keeps it too tight on her face/bridge of nose. Pt states she understands but then continually adjusts straps tighter. RN aware. RT will continue to monitor.

## 2018-06-20 NOTE — Progress Notes (Signed)
Patient placed on BiPAP before RT arrived.  Patient resting well with 4L O2 bleed in.

## 2018-06-20 NOTE — Progress Notes (Signed)
Speech Language Pathology Daily Session Note  Patient Details  Name: Tamara Silva MRN: [id number] Date of Birth: [birth year]  Today's Date: 06/20/2018 SLP Individual Time: 4098-1191 SLP Individual Time Calculation (min): 41 min  Short Term Goals: Week 2: SLP Short Term Goal 1 (Week 2): Pt will demonstrate sustained attention to functional task in 5 minute intervals with Min A verbal cues. SLP Short Term Goal 2 (Week 2): Given Mod A cues, pt will complete basic familiar tasks related to ADLs.   SLP Short Term Goal 3 (Week 2): Given visual aid, pt will answer orientation questions with supervision cues.  SLP Short Term Goal 4 (Week 2): While looking at a picture, pt will produce simple cohesive sentence about picture with Min A cues on 8 of 10 opportunities.   Skilled Therapeutic Interventions:Skilled ST services focused on cognitive skills. Pt was agreeable to participate in treatment session, however not overly eager, stating she has been "sick on her stomach." SLP facilitated orientation to place, time and situation pt demonstrated Mod I, following update to calendar posted in room. SLP facilitated basic problem solving skills utilizing card sorting task by shape, pt required initial min A for problem solving/error awareness (discriminating shapes with similar colors and in various orientations) fading to supervision A verbal cues. Pt demonstrated selective attention in moderately distracting environment, emerging alternating attention to task ( communicating with various staff visitors and then come back to task), however pt's performance in task was impacted, resulting increase in errors, at the divided attention level (during conversation with SLP while completing card task.) Pt missed 15 minutes of treatment session due to request for SLP to leave and refusal to continue participation. Pt expressed desire for her brief to be changed, NT in room and heard communication. Pt was left in room with  call bell within reach, NT in room and bed alarm set. SLP reccomends to continue skilled services.     Pain Pain Assessment Pain Scale: 0-10 Pain Score: 0-No pain  Therapy/Group: Individual Therapy  [name]  [facility] 06/20/2018, 11:51 AM

## 2018-06-20 NOTE — Progress Notes (Signed)
Physical Therapy Session Note  Patient Details  Name: Tamara Silva MRN: [id number] Date of Birth: 12-May-1961  Today's Date: 06/20/2018 PT Individual Time: 0800-0825 PT Individual Time Calculation (min): 25 min   Short Term Goals: Week 2:  PT Short Term Goal 1 (Week 2): Pt will tolerate sitting OOB x 2 hours PT Short Term Goal 2 (Week 2): Pt will perform all transfers and bed mobility with Supervision with LRAD PT Short Term Goal 3 (Week 2): Pt will ambulate x 50 ft with LRAD and CGA  Skilled Therapeutic Interventions/Progress Updates:    Pt received seated EOB. Pt appears fatigued this AM and has ongoing wet cough throughout session with use of suction for mucous removal several times. Pt encouraged to get up out of bed to w/c, pt declines and says she is unable to do anything right now. Attempt to engage patient in conversation about her PLOF and her rehab goals, pt becomes irritated and keeps repeating she is unable to do anything right now. Pt's significant other present and reports he and family were providing some assist to patient with transfers and short distance gait in the home but that the patient spent most of her time in bed or seated EOB. Pt reports feeling nauseous but declines any RN intervention. Pt left seated EOB with needs in reach and significant other present. Pt missed 50 min of scheduled therapy time due to ongoing nausea and unwillingness to participate in session.  Therapy Documentation Precautions:  Precautions Precautions: Fall Precaution Comments: Per chart, 4L 02 at baseline Restrictions Weight Bearing Restrictions: No General: PT Amount of Missed Time (min): 50 Minutes PT Missed Treatment Reason: Patient ill (Comment);Patient unwilling to participate(nausea)    Therapy/Group: Individual Therapy  Excell Seltzer, PT, DPT  06/20/2018, 10:23 AM

## 2018-06-21 ENCOUNTER — Inpatient Hospital Stay (HOSPITAL_COMMUNITY): Payer: Self-pay

## 2018-06-21 ENCOUNTER — Inpatient Hospital Stay (HOSPITAL_COMMUNITY): Payer: Self-pay | Admitting: Occupational Therapy

## 2018-06-21 ENCOUNTER — Inpatient Hospital Stay (HOSPITAL_COMMUNITY): Payer: Self-pay | Admitting: Physical Therapy

## 2018-06-21 ENCOUNTER — Inpatient Hospital Stay (HOSPITAL_COMMUNITY): Payer: Self-pay | Admitting: Speech Pathology

## 2018-06-21 LAB — BASIC METABOLIC PANEL
Anion gap: 8 (ref 5–15)
BUN: 13 mg/dL (ref 6–20)
CO2: 38 mmol/L — ABNORMAL HIGH (ref 22–32)
Calcium: 8.7 mg/dL — ABNORMAL LOW (ref 8.9–10.3)
Chloride: 92 mmol/L — ABNORMAL LOW (ref 98–111)
Creatinine, Ser: 0.97 mg/dL (ref 0.44–1.00)
GFR calc non Af Amer: >60 mL/min (ref >60)
Glucose, Bld: 105 mg/dL — ABNORMAL HIGH (ref 70–99)
Potassium: 3.5 mmol/L (ref 3.5–5.1)
Sodium: 138 mmol/L (ref 135–145)

## 2018-06-21 LAB — GLUCOSE, CAPILLARY
GLUCOSE-CAPILLARY: 192 mg/dL — AB (ref 70–99)
Glucose-Capillary: 118 mg/dL — ABNORMAL HIGH (ref 70–99)
Glucose-Capillary: 186 mg/dL — ABNORMAL HIGH (ref 70–99)
Glucose-Capillary: 198 mg/dL — ABNORMAL HIGH (ref 70–99)

## 2018-06-21 NOTE — Progress Notes (Signed)
Occupational Therapy Session Note  Patient Details  Name: [name] MRN: [id number] Date of Birth: 1961/05/23  Today's Date: 06/21/2018 OT Missed Time: 44 Minutes Missed Time Reason: Patient unwilling/refused to participate without medical reason  Pt refused any ADL or bed level therapeutic intervention despite education on benefits and consequences of refusal. Pt became irritated and increasingly angry with therapist with increased attempts of encouragement.   Curtis Sites 06/21/2018, 11:55 AM

## 2018-06-21 NOTE — Progress Notes (Signed)
Occupational Therapy Discharge Summary  Patient Details  Name: Tamara Silva MRN: [id number] Date of Birth: 09-09-1960   Patient has met 4 of 4 long term goals due to improved activity tolerance and improved balance. Per family and staff report pt is at baseline levels. Patient to discharge at overall Mod Assist level.  Patient's care partner is independent to provide the necessary physical and cognitive assistance at discharge.    Reasons goals not met: All treatment goals met  Recommendation:  Patient will benefit from ongoing skilled OT services in home health setting to continue to advance functional skills in the area of BADL.  Equipment: No equipment provided  Reasons for discharge: treatment goals met and discharge from hospital  Patient/family agrees with progress made and goals achieved: Yes  OT Discharge Precautions/Restrictions  Precautions Precautions: Fall Precaution Comments: 4L 02 at baseline Restrictions Weight Bearing Restrictions: No   Vital Signs Therapy Vitals Pulse Rate: 73 Resp: 18 BP: (!) 133/58 Patient Position (if appropriate): Sitting Oxygen Therapy SpO2: 100 % O2 Device: Room Air Pain Pain Assessment Pain Scale: 0-10 Pain Score: 0-No pain ADL ADL Eating: Not assessed Grooming: Not assessed Upper Body Bathing: Maximal assistance Where Assessed-Upper Body Bathing: Bed level Lower Body Bathing: Other (comment)(2 helpers) Where Assessed-Lower Body Bathing: Bed level Upper Body Dressing: Dependent Where Assessed-Upper Body Dressing: Bed level Lower Body Dressing: Dependent Where Assessed-Lower Body Dressing: Bed level Toileting: Not assessed Toilet Transfer: Not assessed Walk-In Shower Transfer: Not assessed Vision Baseline Vision/History: Wears glasses Wears Glasses: At all times Patient Visual Report: No change from baseline Vision Assessment?: No apparent visual deficits Perception  Perception: Impaired Praxis Praxis:  Impaired Praxis Impairment Details: Initiation;Ideomotor;Perseveration Praxis-Other Comments: delayed processing  Cognition Overall Cognitive Status: Impaired/Different from baseline Arousal/Alertness: Lethargic Orientation Level: Oriented X4 Attention: Sustained Focused Attention: Appears intact Focused Attention Impairment: Verbal basic;Functional basic Sustained Attention: Impaired Sustained Attention Impairment: Verbal basic;Functional basic Memory: Impaired Memory Impairment: Decreased recall of new information;Storage deficit;Retrieval deficit Awareness: Impaired Awareness Impairment: Emergent impairment;Intellectual impairment Problem Solving: Impaired Problem Solving Impairment: Verbal basic;Functional basic Executive Function: Organizing;Self Monitoring;Self Correcting Organizing: Impaired Organizing Impairment: Verbal basic Self Monitoring: Impaired Self Monitoring Impairment: Verbal basic Self Correcting: Impaired Self Correcting Impairment: Verbal basic Behaviors: Lability;Poor frustration tolerance Safety/Judgment: Impaired Sensation Sensation Light Touch: Appears Intact Hot/Cold: Appears Intact Proprioception: Appears Intact Coordination Gross Motor Movements are Fluid and Coordinated: No Fine Motor Movements are Fluid and Coordinated: No Coordination and Movement Description: Coordination and movement is impaired d/t ongoing weakness, however per family report pt at baseline Motor  Motor Motor: Other (comment) Motor - Discharge Observations: ongoing generalized weakness Mobility  Bed Mobility Bed Mobility: Rolling Right;Rolling Left;Sit to Supine;Supine to Sit Rolling Right: Supervision/verbal cueing Rolling Left: Supervision/Verbal cueing Supine to Sit: Minimal Assistance - Patient > 75% Sit to Supine: Minimal Assistance - Patient > 75% Transfers Sit to Stand: Minimal Assistance - Patient > 75%  Trunk/Postural Assessment  Cervical Assessment Cervical  Assessment: Exceptions to WFL(forward head) Thoracic Assessment Thoracic Assessment: Exceptions to WFL(rounded shoulders) Lumbar Assessment Lumbar Assessment: Exceptions to WFL(posterior pelvic tilt) Postural Control Postural Control: Within Functional Limits  Balance Balance Balance Assessed: Yes Static Sitting Balance Static Sitting - Balance Support: Feet supported Static Sitting - Level of Assistance: 5: Stand by assistance Dynamic Sitting Balance Dynamic Sitting - Balance Support: Feet supported;No upper extremity supported Dynamic Sitting - Level of Assistance: 5: Stand by assistance Static Standing Balance Static Standing - Balance Support: During functional activity Static Standing - Level of Assistance:  5: Stand by assistance Dynamic Standing Balance Dynamic Standing - Balance Support: During functional activity;Bilateral upper extremity supported Dynamic Standing - Level of Assistance: 4: Min assist Dynamic Standing - Comments: using RW during transfer Extremity/Trunk Assessment RUE Assessment RUE Assessment: Exceptions to [facility] Active Range of Motion (AROM) Comments: WFL General Strength Comments: generalized weakness LUE Assessment LUE Assessment: Exceptions to [facility] Active Range of Motion (AROM) Comments: [facility] General Strength Comments: generalized weakness   Curtis Sites 06/21/2018, 4:51 PM

## 2018-06-21 NOTE — Progress Notes (Signed)
Occupational Therapy Session Note  Patient Details  Name: Tamara Silva MRN: [id number] Date of Birth: 1960/07/24  Today's Date: 06/21/2018 OT Individual Time: 1115-1145 OT Individual Time Calculation (min): 30 min    Short Term Goals: Week 3:     Skilled Therapeutic Interventions/Progress Updates:    Patient in bed at start of session with CPAP on, significant other "[name]" present.  She declines all self care, refuses to take off her CPAP but is willing to practice a transfer with [name].  Bed mobility and SPT to/from bed and bedside commode completed - [name] provided min A for supine to SSP and was able to guard patient for pivot transfer.  Cues were provided for equipment and helper positioning for safety.  Patient remained sitting at edge of bed for 20 minutes with good posture and tolerance of this position.  She demonstrates good strength and AROM of bilateral UEs but declines conditioning exercises or activities.  Reviewed importance of regular/timed mobility in home setting, verbal review of self care positioning and encouraged patient to participate as much as possible.  Patient able to take her CPAP off independently - provided O2.  Patient and significant other state that they are ready for discharge, decline further education or discussion and asked me to leave.  Patient remained sitting at the edge of bed with [name]'s supervision - alarm not set and nursing aware.  Therapy Documentation Precautions:  Precautions Precautions: Fall Precaution Comments: Per chart, 4L 02 at baseline Restrictions Weight Bearing Restrictions: No General: General OT Amount of Missed Time: 15 Minutes Vital Signs:  Pain: Pain Assessment Pain Scale: 0-10 Pain Score: 0-No pain Other Treatments:     Therapy/Group: Individual Therapy  Carlos Levering 06/21/2018, 12:03 PM

## 2018-06-21 NOTE — Progress Notes (Signed)
Physical Therapy Discharge Summary  Patient Details  Name: Tamara Silva MRN: [id number] Date of Birth: 03-Sep-1960  Today's Date: 06/21/2018 PT Individual Time: 1000-1045 PT Individual Time Calculation (min): 45 min    Patient has met 5 of 6 long term goals due to improved activity tolerance, improved balance, improved postural control, increased strength, improved attention, improved awareness and improved coordination.  Patient to discharge at an ambulatory level [place].   Patient's care partner is independent to provide the necessary physical and cognitive assistance at discharge. Pt's care partner was not present during final therapy session for hands-on family training but he was providing min A to patient prior to admission, has been present during therapy sessions throughout rehab stay, and was present during OT session when he demonstrated competence with assisting pt with transfers with RW. Pt will be home-bound upon d/c and will only be ambulating short distances in the home with RW.  Reasons goals not met: Pt did not meet w/c mobility goal as she will not be using a w/c at home and goal is no longer applicable.  Recommendation:  Patient will benefit from ongoing skilled PT services in home health setting to continue to advance safe functional mobility, address ongoing impairments in endurance, strength, independence with functional mobility, balance, and minimize fall risk.  Equipment: No equipment provided. Pt already owns RW.  Reasons for discharge: treatment goals met and discharge from hospital  Patient/family agrees with progress made and goals achieved: Yes   Skilled Intervention: Pt received seated in bed, very lethargic and requires significant encouragement for participation in therapy session. Supine to sit with min A. Pt on Bipap machine throughout therapy session and declines to remove it and don nasal cannula. Pt's care partner not present during session, assisted  pt with calling him so that he can be present for next therapy session for hands-on family training. Education with patient about continuing to engage in time spent out of bed and short distance gait with RW once she d/c home to continue to improve endurance. Pt declines to perform any seated therex or OOB mobility this AM. Sit to supine min A. Pt left seated in bed with needs in reach and bed alarm in place. Pt missed 15 min of scheduled therapy session due to fatigue and refusal to participate.   PT Discharge Precautions/Restrictions Precautions Precautions: Fall Precaution Comments: 4L 02 at baseline Restrictions Weight Bearing Restrictions: No Pain Pain Assessment Pain Scale: 0-10 Pain Score: 0-No pain Vision/Perception  Perception Perception: Impaired Praxis Praxis: Impaired Praxis Impairment Details: Initiation;Ideomotor;Perseveration  Cognition Overall Cognitive Status: Impaired/Different from baseline Arousal/Alertness: Lethargic Orientation Level: Oriented X4 Attention: Sustained Focused Attention: Appears intact Sustained Attention: Impaired Memory: Impaired Memory Impairment: Decreased recall of new information;Storage deficit;Retrieval deficit Awareness: Impaired Awareness Impairment: Emergent impairment;Intellectual impairment Problem Solving: Impaired Behaviors: Perseveration;Restless;Impulsive;Lability Safety/Judgment: Impaired Sensation Sensation Light Touch: Appears Intact Proprioception: Appears Intact Coordination Gross Motor Movements are Fluid and Coordinated: No Coordination and Movement Description: (impaired by ongoing generalized weakness) Motor  Motor Motor - Discharge Observations: ongoing generalized weakness  Mobility Bed Mobility Bed Mobility: Rolling Right;Rolling Left;Sit to Supine;Supine to Sit Rolling Right: Supervision/verbal cueing Rolling Left: Supervision/Verbal cueing Supine to Sit: Minimal Assistance - Patient > 75% Sit to  Supine: Minimal Assistance - Patient > 75% Transfers Transfers: Sit to Stand;Stand Pivot Transfers Sit to Stand: Minimal Assistance - Patient > 75% Stand Pivot Transfers: Minimal Assistance - Patient > 75% Stand Pivot Transfer Details: Verbal cues for sequencing;Verbal cues for technique;Verbal cues  for precautions/safety;Verbal cues for safe use of DME/AE Transfer (Assistive device): Rolling walker Locomotion  Gait Ambulation: Yes Gait Assistance: Minimal Assistance - Patient > 75% Assistive device: Rolling walker Gait Assistance Details: Verbal cues for sequencing;Verbal cues for technique;Verbal cues for precautions/safety;Verbal cues for gait pattern;Verbal cues for safe use of DME/AE Gait Gait: Yes Gait Pattern: Impaired Gait Pattern: Step-through pattern;Decreased step length - right;Decreased step length - left Gait velocity: decreased Stairs / Additional Locomotion Stairs: No Wheelchair Mobility Wheelchair Mobility: No  Trunk/Postural Assessment  Cervical Assessment Cervical Assessment: Exceptions to WFL(forward head) Thoracic Assessment Thoracic Assessment: Exceptions to WFL(rounded shoulders) Lumbar Assessment Lumbar Assessment: Exceptions to WFL(posterior pelvic tilt) Postural Control Postural Control: Within Functional Limits  Balance Balance Balance Assessed: Yes Static Sitting Balance Static Sitting - Level of Assistance: 5: Stand by assistance Dynamic Sitting Balance Dynamic Sitting - Level of Assistance: 5: Stand by assistance Static Standing Balance Static Standing - Level of Assistance: 5: Stand by assistance Dynamic Standing Balance Dynamic Standing - Level of Assistance: 4: Min assist Extremity Assessment   RLE Assessment RLE Assessment: Within Functional Limits General Strength Comments: 3/5 hip, 5/5 knee flex/ext and ankle DF LLE Assessment LLE Assessment: Within Functional Limits General Strength Comments: 3+/5 hip, 5/5 knee flex/ext and ankle  DF     Excell Seltzer, PT, DPT 06/21/2018, 4:01 PM

## 2018-06-21 NOTE — Progress Notes (Signed)
Pt refused morning medications, full assessment, and therapy. RN was only able to do a partial assessment ( neuro,lungs and heart).  Pt stated '' I'm tired, I was pooping all night.''  MD and charge nurse made aware.  RN will allow pt to rest for now. Will continue to monitor.

## 2018-06-21 NOTE — Progress Notes (Signed)
Speech Language Pathology Discharge Summary  Patient Details  Name: Tamara Silva MRN: [id number] Date of Birth: April 14, 1961  Today's Date: 06/21/2018 SLP Individual Time: 1400-1430 SLP Individual Time Calculation (min): 30 min   Skilled Therapeutic Interventions:  Skilled treatment session focused on completing caregiver education with pt's husband. Pt sitting EOB with BiPap in place. Pt requesting for "someone to download Tetris for her husband" - she was easily redirected. Education provided to pt's husband on memory strategies and all questions answered to husband's satisfaction.    Patient has met 8 of 8 long term goals.  Patient to discharge at overall Min;Mod level.   Clinical Impression/Discharge Summary:   Pt has made very little overall functional progress while in CIR. She continues to be limited by fatigue and poor task tolerance. Although she would benefit from [facility], it is uncertain if she would participate and see the benefit from services.   Care Partner:  Caregiver Able to Provide Assistance: Yes  Type of Caregiver Assistance: Physical;Cognitive  Recommendation:  24 hour supervision/assistance      Equipment:     Reasons for discharge: Discharged from hospital;Lack of progress towards goals   Patient/Family Agrees with Progress Made and Goals Achieved: Yes    Warnie Belair 06/21/2018, 3:18 PM

## 2018-06-21 NOTE — Discharge Summary (Signed)
Physician Discharge Summary  Patient ID: Tamara Silva MRN: [id number] DOB/AGE: [birth year] 58 y.o.  Admit date: 06/09/2018 Discharge date: 06/22/2018  Discharge Diagnoses:  Principal Problem:   Encephalopathy Active Problems:   Morbid obesity (HCC)   OSA (obstructive sleep apnea)   Obesity hypoventilation syndrome (HCC)   COPD (chronic obstructive pulmonary disease) (HCC)   Diabetes mellitus type 2 in obese (HCC)   Hyperammonemia (HCC)   Acute on chronic diastolic CHF (congestive heart failure) (HCC)   Supplemental oxygen dependent   Slow transit constipation   Chronic respiratory failure with hypoxia and hypercapnia (HCC)   Hepatic steatosis   Discharged Condition:  Stable   Significant Diagnostic Studies: Ct Abdomen Pelvis Wo Contrast  Result Date: 05/29/2018 CLINICAL DATA:  Abdominal distension. Severe anemia. GI bleed. EXAM: CT ABDOMEN AND PELVIS WITHOUT CONTRAST TECHNIQUE: Multidetector CT imaging of the abdomen and pelvis was performed following the standard protocol without IV contrast. COMPARISON:  06/23/2015 abdominal sonogram. FINDINGS: Lower chest: Mild patchy consolidation in peripheral right middle lobe. Patchy tree-in-bud opacity in the dependent right lower lobe. Parenchymal banding at both lung bases compatible with postinfectious/postinflammatory scarring. Mild cardiomegaly. Right coronary atherosclerosis. Posterior left pleural coarse calcification. Hepatobiliary: Diffuse hepatic steatosis. No definite liver surface irregularity. No liver mass. Contracted gallbladder with no radiopaque cholelithiasis. No biliary ductal dilatation. Pancreas: Normal, with no mass or duct dilation. Spleen: Normal size. No mass. Adrenals/Urinary Tract: Normal adrenals. No hydronephrosis. No renal stones. No contour deforming renal masses. Normal bladder. Stomach/Bowel: Mild gaseous distention of the stomach with gastric fluid level. No definite gastric wall thickening. Normal caliber small  bowel with no small bowel wall thickening. Candidate appendix appears normal. Mild sigmoid diverticulosis, with no large bowel wall thickening or significant pericolonic fat stranding. Vascular/Lymphatic: Atherosclerotic nonaneurysmal abdominal aorta. No pathologically enlarged lymph nodes in the abdomen or pelvis. Reproductive: Grossly normal uterus.  No adnexal mass. Other: No pneumoperitoneum. Small to moderate volume ascites. No focal fluid collection. Moderate anasarca. Musculoskeletal: No aggressive appearing focal osseous lesions. IMPRESSION: 1. No evidence of bowel obstruction or acute bowel inflammation. Mild sigmoid diverticulosis, with no evidence of acute diverticulitis. 2. Small to moderate volume ascites. Moderate anasarca. 3. Mild patchy consolidation in the peripheral right middle lobe. Patchy tree-in-bud opacity in the dependent right lower lobe. Findings suggest a mild bronchopneumonia, with the differential including aspiration. Suggest attention on follow-up chest imaging in 3 months. 4. Diffuse hepatic steatosis. 5.  Aortic Atherosclerosis (ICD10-I70.0). Electronically Signed   By: Ilona Sorrel M.D.   On: 05/29/2018 23:52   Dg Chest 2 View  Result Date: 06/12/2018 CLINICAL DATA:  Cough, wheezing and shortness of breath. EXAM: CHEST - 2 VIEW COMPARISON:  Single-view of the chest 06/09/2018 and 06/04/2018. PA and lateral chest 07/25/2017. CT chest 06/28/2015. FINDINGS: Cardiomegaly and pulmonary edema are chronic and appear unchanged. Pleural scar and calcification along the left upper chest wall are again identified. No pneumothorax or pleural effusion. No acute bony abnormality. IMPRESSION: Cardiomegaly and chronic mild pulmonary edema. Electronically Signed   By: Inge Rise M.D.   On: 06/12/2018 08:58   Dg Abd 1 View  Result Date: 06/12/2018 CLINICAL DATA:  Patient complains of constipation. EXAM: ABDOMEN - 1 VIEW COMPARISON:  June 05, 2018 FINDINGS: Mild fecal loading in the  colon. No bowel obstruction. No free air, portal venous gas, pneumatosis seen on supine imaging. No other acute abnormalities. IMPRESSION: Mild fecal loading in the colon. Electronically Signed   By: Dorise Bullion III M.D   On:  06/12/2018 19:39    Labs:  Basic Metabolic Panel: BMP Latest Ref Rng & Units 06/21/2018 06/19/2018 06/14/2018  Glucose 70 - 99 mg/dL 105(H) 168(H) 124(H)  BUN 6 - 20 mg/dL 13 14 17  Creatinine 0.44 - 1.00 mg/dL 0.97 0.93 1.09(H)  BUN/Creat Ratio 9 - 23 - - -  Sodium 135 - 145 mmol/L 138 137 140  Potassium 3.5 - 5.1 mmol/L 3.5 3.7 3.0(L)  Chloride 98 - 111 mmol/L 92(L) 89(L) 87(L)  CO2 22 - 32 mmol/L 38(H) 41(H) 47(H)  Calcium 8.9 - 10.3 mg/dL 8.7(L) 8.8(L) 9.2    CBC: CBC Latest Ref Rng & Units 06/20/2018 06/19/2018 06/14/2018  WBC 4.0 - 10.5 K/uL 8.5 8.0 7.8  Hemoglobin 12.0 - 15.0 g/dL 7.3(L) 7.1(L) 7.9(L)  Hematocrit 36.0 - 46.0 % 28.4(L) 26.9(L) 29.4(L)  Platelets 150 - 400 K/uL 235 214 191    CBG: Recent Labs  Lab 06/21/18 1148 06/21/18 1658 06/21/18 2135 06/22/18 0643 06/22/18 1116  GLUCAP 186* 192* 198* 198* 203*    Brief HPI:   Tamara Silva is a 58-year-old female with history of T2DM, COPD with chronic hypoxic hypercarbic respiratory failure -- 4L oxygen dependent, OSA--no CPAP x2 years, anemia, morbid obesity-BMI 50; who was admitted to [facility] on 05/29/2018 with generalized weakness, lethargy, acute on chronic anemia with hemoglobin 5.7 and slurred speech.  Family reported issues with confusion few days prior to admission and ammonia levels noted to be elevated at 94.  UDS positive for cocaine.  She was treated with diuretics for acute on chronic biventricular heart failure, placed on BiPAP and transfused with 2 units packed red blood cells with improvement.  Cardiology consulted for input and recommended holding antiplatelets until source of bleeding found.  CT of head done revealing right acute to subacute occipitoparietal infarct therefore she was  transferred to [facility] for management.  Neurology felt stroke likely cardioembolic but patient not a good long-term AC candidate due to chronic anemia.  Recommendations to resume aspirin once H&H stable.  Attempts made to wean patient from BiPAP however she developed progressive hypoxia with unresponsiveness and generalized seizure type activity requiring intubation.  She was loaded with Keppra and workup negative for seizure. Follow-up CT head negative for extension of stroke or bleed.   Acute on chronic CHF improving with IV diuresis and she was transitioned to Demadex.  Respiratory failure felt to be multifactorial due to OHS, sleep apnea and COPD.  Therapies initiated and patient noted to have issues with delayed processing, poor safety awareness, deficits in mobility as well as ADLs.  CIR was recommended due to functional decline.     Hospital Course: Tamara Silva was admitted to rehab 06/09/2018 for inpatient therapies to consist of PT, ST and OT at least three hours five days a week. Past admission physiatrist, therapy team and rehab RN have worked together to provide customized collaborative inpatient rehab. She continued to exhibit wheezing at admission and Chest x-ray done revealing congestion and she was treated with torsemide briefly with improvement in symptoms.   Hypokalemia has resolved with brief supplementation.  Weights have been monitored daily and is 138 kg at discharge.  Serial check of lytes showed that renal status is stable.  CBC has been monitored with serial check and H&H is relatively stable without signs of bleeding. Repeat ammonia levels showed elevation therefore lactulose was started on 112 and titrated upwards to help manage hyperammonemia.  Constipation has resolved with adjustment of bowel program and laxative were discontinued   once lactulose was added.   Multifactorial encephalopathy is resolving with treatment of CHF as well as ammonia leves. Patient has been  refusing multiple dose of lactulose due to diarrhea.  Diabetes has been monitored with AC at bedtime CBG checks.  She is to continue on levemir 18 units and follow up. Pulmonary was consulted for input on progressive chronic hypoxic hypercarbic respiratory failure and felt that this was due to severe OSA/OHS.[name] Kitchen  She was treated with a dose of IV Diamox to help correct PCO2 as well as for diuresis due to fluid overload.  She is to continue using BiPAP when napping and at bedtime.  She is also been educated on importance of low-salt/heart healthy diet but has been noncompliant with this and has been ordering food from outside vendors. Her progress has been limited decreased motivation and inconsistent participation in therapy. She has progressed to her baseline of min assist level and will continue to receive follow up [place], [place], [facility] and [place] by [place] after discharge.     Rehab course: During patient's stay in rehab weekly team conferences were held to monitor patient's progress, set goals and discuss barriers to discharge. At admission, patient required +2 assist to complete ADLs and max assist for mobility. She exhibited severe cognitive linguistic impairments with MoCA score 7/30 but exam limited by fatigue and mod-max encouragement that was needed for participation.  She  has had improvement in activity tolerance, balance, postural control as well as ability to compensate for deficits.  Patient currently requires mod assist for ADL tasks which is her baseline per family reports. She requires min assist for transfers and min assist to ambulate short distances with rolling walker.  Family education was completed with significant other.  She has been educated on importance of participating in therapy, as well as working on endurance and activity tolerance past discharge.  Family education was completed regarding all aspects of care.   Disposition: Home  Diet: Heart healthy/diabetic.   Special  Instructions: 1. Use BIPAP when napping and at bedtime.   2. HHRN to draw CBC and ammonia levels on 1/27 with results to Dr. Chapman Fitch.    Discharge Instructions    Ambulatory referral to Cardiology   Complete by:  As directed    Needs to be set up for 30 day cardiac event monitor.     Allergies as of 06/22/2018      Reactions   Metolazone Other (See Comments)   Dizziness and falling   Plavix [clopidogrel Bisulfate] Other (See Comments)   High PRU's - non-responder   Ibuprofen    Patient stated she can not take ibuprofen.    Nsaids Other (See Comments)   "I do not take NSAIDs d/t interfering with other meds"      Medication List    STOP taking these medications   chlorhexidine gluconate (MEDLINE KIT) 0.12 % solution Commonly known as:  PERIDEX   fluticasone 50 MCG/ACT nasal spray Commonly known as:  FLONASE   heparin 5000 UNIT/ML injection   hydrALAZINE 20 MG/ML injection Commonly known as:  APRESOLINE   insulin aspart 100 UNIT/ML injection Commonly known as:  novoLOG   iron polysaccharides 150 MG capsule Commonly known as:  NIFEREX   ondansetron 4 MG tablet Commonly known as:  ZOFRAN   pantoprazole 40 MG tablet Commonly known as:  PROTONIX   polyethylene glycol packet Commonly known as:  MIRALAX / GLYCOLAX   torsemide 20 MG tablet Commonly known as:  DEMADEX  TAKE these medications   acetaminophen 325 MG tablet Commonly known as:  TYLENOL Take 1-2 tablets (325-650 mg total) by mouth every 4 (four) hours as needed for mild pain. What changed:    how much to take  when to take this  reasons to take this   aspirin 81 MG tablet Take 1 tablet (81 mg total) by mouth daily.   carvedilol 3.125 MG tablet Commonly known as:  COREG Take 1 tablet (3.125 mg total) by mouth 2 (two) times daily with a meal. What changed:    medication strength  See the new instructions.   hydrocerin Crea Apply 1 application topically 2 (two) times daily.   Insulin  Detemir 100 UNIT/ML Pen Commonly known as:  LEVEMIR FLEXTOUCH Inject 18 Units into the skin daily. What changed:  how much to take   ipratropium-albuterol 0.5-2.5 (3) MG/3ML Soln Commonly known as:  DUONEB Use nebulizer to inhale contents of one vial every 6 hours as needed for shortness of breath, wheezing   lactulose 10 GM/15ML solution Commonly known as:  CHRONULAC Take 45 mLs (30 g total) by mouth 2 (two) times daily.   NEEDLE (DISP) 26 G 26G X 1/2" Misc 1 application by Does not apply route daily.   nitroGLYCERIN 0.4 MG SL tablet Commonly known as:  NITROSTAT Place 1 tablet (0.4 mg total) under the tongue every 5 (five) minutes as needed for chest pain.   omeprazole 40 MG capsule Commonly known as:  PRILOSEC Take 1 capsule (40 mg total) by mouth daily. What changed:  when to take this   rosuvastatin 40 MG tablet Commonly known as:  CRESTOR Take 1 tablet (40 mg total) by mouth daily at 6 PM.      Follow-up Information    Swartz, Zachary T, MD Follow up.   Specialty:  Physical Medicine and Rehabilitation Why:  call as needed Contact information: [address] [place] [address] [phone]        Groce, Sarah F, NP Follow up on 06/28/2018.   Specialty:  Pulmonary Disease Why:  Be there at 2:15 for 2:30 pm appointment Contact information: [address] [place] [address] [phone]        Clegg, Amy D, NP Follow up on 07/07/2018.   Specialty:  Cardiology Why:  Be there at 11:30 am for post hospital follow up.  Contact information: [address] [place] [address] [phone]        Fulp, Cammie, MD Follow up on 07/17/2018.   Specialty:  Family Medicine Why:  Post hospital follow up at 9:30 am. Contact information: [address] [place] [address] [phone]           Signed:  S  06/22/2018, 5:47 PM  

## 2018-06-21 NOTE — Progress Notes (Signed)
[facility] PHYSICAL MEDICINE & REHABILITATION PROGRESS NOTE  Subjective/Complaints: Patient sitting in bed.  Has BiPAP on and is pecking at her phone.   ROS: Patient denies fever, rash, sore throat, blurred vision, nausea, vomiting, diarrhea, cough, shortness of breath or chest pain, joint or back pain, headache, or mood change.    Objective: Vital Signs: Blood pressure (!) 113/56, pulse 71, temperature (!) 96.5 F (35.8 C), resp. rate 18, height 5' 7.5" (1.715 m), weight (!) 137.6 kg, last menstrual period 07/28/2013, SpO2 100 %. No results found. Recent Labs    06/19/18 0653 06/20/18 1134  WBC 8.0 8.5  HGB 7.1* 7.3*  HCT 26.9* 28.4*  PLT 214 235   Recent Labs    06/19/18 0653 06/21/18 0611  NA 137 138  K 3.7 3.5  CL 89* 92*  CO2 41* 38*  GLUCOSE 168* 105*  BUN 14 13  CREATININE 0.93 0.97  CALCIUM 8.8* 8.7*    Physical Exam: BP (!) 113/56 (BP Location: Left Arm)   Pulse 71   Temp (!) 96.5 F (35.8 C)   Resp 18   Ht 5' 7.5" (1.715 m)   Wt (!) 137.6 kg   LMP 07/28/2013 Comment: perimenopausal  SpO2 100%   BMI 46.81 kg/m  Constitutional: No distress . Vital signs reviewed. HEENT: EOMI, oral membranes moist Neck: supple Cardiovascular: RRR without murmur. No JVD    Respiratory: CTA Bilaterally without wheezes or rales. Normal effort   wearing BiPAP GI: BS +, non-tender, non-distended  Musculoskeletal: Edemapresent in bilateral lower extremities.  Neurological:      Alert, follows commands.  Fair insight.  Good sitting balance.  strength is grossly 3+ to 4 out of 5 in all 4   Skin: Skin iswarm.     Psychiatric:   flat  Assessment/Plan: 1. Functional deficits secondary to debility which require 3+ hours per day of interdisciplinary therapy in a comprehensive inpatient rehab setting.  Physiatrist is providing close team supervision and 24 hour management of active medical problems listed below.  Physiatrist and rehab team continue to assess barriers  to discharge/monitor patient progress toward functional and medical goals  Care Tool:  Bathing  Bathing activity did not occur: Refused(stated she was too fatigued today and completed last night) Body parts bathed by patient: Right arm, Left arm, Chest, Abdomen(UB only)   Body parts bathed by helper: Right arm, Left arm, Chest, Abdomen, Front perineal area, Buttocks, Right upper leg, Left upper leg, Right lower leg, Left lower leg     Bathing assist Assist Level: Set up assist     Upper Body Dressing/Undressing Upper body dressing   What is the patient wearing?: Hospital gown only(had no clean clothes here)    Upper body assist Assist Level: Minimal Assistance - Patient > 75%    Lower Body Dressing/Undressing Lower body dressing      What is the patient wearing?: Hospital gown only     Lower body assist Assist for lower body dressing: Maximal Assistance - Patient 25 - 49%     Toileting Toileting Toileting Activity did not occur (Clothing management and hygiene only): (Incontinent)  Toileting assist Assist for toileting: Total Assistance - Patient < 25%     Transfers Chair/bed transfer  Transfers assist  Chair/bed transfer activity did not occur: Safety/medical concerns  Chair/bed transfer assist level: Minimal Assistance - Patient > 75%     Locomotion Ambulation   Ambulation assist   Ambulation activity did not occur: Safety/medical concerns  Assist level:  Minimal Assistance - Patient > 75% Assistive device: Walker-rolling Max distance: 30'   Walk 10 feet activity   Assist  Walk 10 feet activity did not occur: Refused  Assist level: Minimal Assistance - Patient > 75% Assistive device: Walker-rolling   Walk 50 feet activity   Assist Walk 50 feet with 2 turns activity did not occur: Safety/medical concerns         Walk 150 feet activity   Assist Walk 150 feet activity did not occur: Safety/medical concerns         Walk 10 feet on  uneven surface  activity   Assist Walk 10 feet on uneven surfaces activity did not occur: Safety/medical concerns         Wheelchair     Assist Will patient use wheelchair at discharge?: Yes Type of Wheelchair: Manual Wheelchair activity did not occur: Safety/medical concerns         Wheelchair 50 feet with 2 turns activity    Assist    Wheelchair 50 feet with 2 turns activity did not occur: Safety/medical concerns       Wheelchair 150 feet activity     Assist Wheelchair 150 feet activity did not occur: Safety/medical concerns          Medical Problem List and Plan: 1.Functional deficitssecondary to metabolic encephalopathy, right occipital-parietal infact and debility after multiple medical issues  -Potential discharge home today versus tomorrow with intensive home health support and follow-up  -Patient needs active follow-up with primary and pulmonary medicine.  I will not have her follow-up at [facility] health physical medicine and rehab as her primary needs are pulmonary/medical 2. DVT Prophylaxis/Anticoagulation: Pharmaceutical:Lovenox 3. Pain Management:Tylenol as needed 4. Mood:LCSW to follow for evaluation and support 5. Neuropsych: This patientis not fullycapable of making decisions on hisown behalf. 6. Skin/Wound Care:Routine pressure relief measures 7. Fluids/Electrolytes/Nutrition:Monitor I/O.    encourage PO 8. COPD with chronic hypoxic respiratory failure: oxygen dependent--uses 4 L at baseline.  -  ABG  essentially stable   -continue BiPAP  -Needs outpatient pulmonary follow-up    9. OSA/OHS:  Now requiring BIPAP.  Added nebs prn. 10. Acute on chronic CHF: Continue low-salt diet. On Coreg, statin. May need LFTs checked every 2 months.         Filed Weights   06/19/18 0546 06/20/18 0500 06/21/18 0518  Weight: (!) 140.6 kg (!) 139.4 kg (!) 137.6 kg   Weights are unreliable  - oral Diamox, 250mg  x1 1/19  -Close  outpatient monitoring 11. Acute on chronic anemia: Improved post transfusion. Now back on ASA--monitor with serial checks.   Hemoglobin 7.9 on 1/15, down to 7.1 1/20--- hemoglobin on 1/21 was 7.3  -Follow-up hemoglobin on Friday per home health 12.Elevated ammonia levels:    Ammonia level 99--->1/16 67---71 1/20  Continue increased lactulose    -Continue to check ammonia levels at home-check level Friday 13.Multifactorial encephalopathy: appears to be improving with rx of ammonia, CO2, and CHF  -can wax and wane 14. T2DM: Monitor BS ac/hs. Continue Levemir with SSI. Intake variable --will continue to titrate as indicated.   eating more that more alert, noncompliant with diet however  Increased levemir to 18u qam with some improvement 15.  Morbid obesity  Encourage weight loss 16.  Constipation   moving bowels  -sorbitol prn  -lactulose scheduled-- tolerate loose stools somewhat given ammonia levels 17.  Labile blood pressure   Improved control 1/22  LOS: 12 days A FACE TO FACE EVALUATION WAS PERFORMED  Meredith Staggers 06/21/2018, 8:43 AM

## 2018-06-22 ENCOUNTER — Telehealth: Payer: Self-pay | Admitting: Family Medicine

## 2018-06-22 ENCOUNTER — Encounter (HOSPITAL_COMMUNITY): Payer: Self-pay | Admitting: Physical Medicine and Rehabilitation

## 2018-06-22 ENCOUNTER — Telehealth (HOSPITAL_COMMUNITY): Payer: Self-pay | Admitting: Physical Medicine and Rehabilitation

## 2018-06-22 DIAGNOSIS — K760 Fatty (change of) liver, not elsewhere classified: Secondary | ICD-10-CM

## 2018-06-22 DIAGNOSIS — J449 Chronic obstructive pulmonary disease, unspecified: Secondary | ICD-10-CM

## 2018-06-22 LAB — GLUCOSE, CAPILLARY
Glucose-Capillary: 198 mg/dL — ABNORMAL HIGH (ref 70–99)
Glucose-Capillary: 203 mg/dL — ABNORMAL HIGH (ref 70–99)

## 2018-06-22 MED ORDER — ROSUVASTATIN CALCIUM 40 MG PO TABS: 40.0000 mg | ORAL_TABLET | Freq: Every day | ORAL | 0 refills | Status: DC

## 2018-06-22 MED ORDER — ACETAMINOPHEN 325 MG PO TABS: 325.0000 mg | ORAL_TABLET | ORAL | Status: DC | PRN

## 2018-06-22 MED ORDER — "NEEDLE (DISP) 26G X 1/2"" MISC": 1.0000 "application " | Freq: Every day | 0 refills | Status: AC

## 2018-06-22 MED ORDER — INSULIN DETEMIR 100 UNIT/ML FLEXPEN: 18.0000 [IU] | PEN_INJECTOR | Freq: Every day | SUBCUTANEOUS | 0 refills | Status: DC

## 2018-06-22 MED ORDER — LACTULOSE 10 GM/15ML PO SOLN: 30.0000 g | Freq: Two times a day (BID) | ORAL | 0 refills | Status: DC

## 2018-06-22 MED ORDER — INSULIN DETEMIR 100 UNIT/ML ~~LOC~~ SOLN: 18.0000 [IU] | Freq: Every day | SUBCUTANEOUS | 11 refills | Status: DC

## 2018-06-22 MED ORDER — IPRATROPIUM-ALBUTEROL 0.5-2.5 (3) MG/3ML IN SOLN: RESPIRATORY_TRACT | 0 refills | Status: DC

## 2018-06-22 MED ORDER — OMEPRAZOLE 40 MG PO CPDR: 40.0000 mg | DELAYED_RELEASE_CAPSULE | Freq: Every day | ORAL | 0 refills | Status: DC

## 2018-06-22 MED ORDER — CARVEDILOL 3.125 MG PO TABS: 3.1250 mg | ORAL_TABLET | Freq: Two times a day (BID) | ORAL | 0 refills | Status: DC

## 2018-06-22 MED ORDER — HYDROCERIN EX CREA: 1.0000 "application " | TOPICAL_CREAM | Freq: Two times a day (BID) | CUTANEOUS | 0 refills | Status: DC

## 2018-06-22 NOTE — Discharge Instructions (Signed)
Inpatient Rehab Discharge Instructions  Soniyah Mcglory Discharge date and time: 06/22/18   Activities/Precautions/ Functional Status: Activity: no lifting, driving, or strenuous exercise  tilll clerared by MD Diet: cardiac diet and diabetic diet Wound Care: none needed   Functional status:  ___ No restrictions     ___ Walk up steps independently _X__ 24/7 supervision/assistance   ___ Walk up steps with assistance ___ Intermittent supervision/assistance  ___ Bathe/dress independently ___ Walk with walker     _X__ Bathe/dress with assistance ___ Walk Independently    ___ Shower independently ___ Walk with assistance    ___ Shower with assistance _X__ No alcohol     ___ Return to work/school ________  Special Instructions: 1. Use BIPAP whenever napping and at bedtime 2. DO not increase oxygen. Contact pulmonary if you have problems with breathing. Need to limit salt intake to minimal to none.  3. Stay out of bed at least 2 hours three times a day to help build endurance.  4. You need to take lactulose at least twice a day to prevent build up of ammonia.     COMMUNITY REFERRALS UPON DISCHARGE:    Home Health:   PT     OT     ST    RN                     Agency: [facility]  Phone: [phone]    Medical Equipment/Items Ordered:  Wheelchair and cushion                                                      Agency/Supplier:   [facility] @ [phone]  Oxygen/ BiPap:  [facility] @ [phone]    My questions have been answered and I understand these instructions. I will adhere to these goals and the provided educational materials after my discharge from the hospital.  Patient/Caregiver Signature _______________________________ Date __________  Clinician Signature _______________________________________ Date __________  Please bring this form and your medication list with you to all your follow-up doctor's appointments.

## 2018-06-22 NOTE — Progress Notes (Signed)
Social Work  Discharge Note  The overall goal for the admission was met for:   Discharge location: Yes - home with caregiver, [name], who can and will provide 24/7 assistance.  Length of Stay: Yes - 13 days  Discharge activity level: Yes - minimal assistance overall  Home/community participation: Yes  Services provided included: MD, RD, PT, OT, SLP, RN, Pharmacy and SW  Financial Services: Medicaid  Follow-up services arranged: Home Health: RN, PT, OT, ST via [facility], DME: 20x18 lightweight w/c with cushion via [facility], Other: MD ordering Bipap via [facility] who is already supplying pt with home O2 and CPAP. and Patient/Family has no preference for HH/DME agencies  Comments (or additional information): Have discussed case with Eden Lathe, RN with [facility] in the [facility] program.  She plans to follow up with pt and is referring her to the community [facility] for closer, in-home medical management.  She will follow up further with pt as outpatient.  (Please note that pt was requesting a hospital bed for home, however, I have discussed this with therapy team and they note that pt is min assistance for bed mobility and she does not meet criteria for hospital bed.)  Contact #s for patient:     Daughter, Glenford Bayley @ [phone]  Patient/Family verbalized understanding of follow-up arrangements: Yes  (reviewed with pt, daughter and caregiver ([place])  Individual responsible for coordination of the follow-up plan: pt/ daughter  Confirmed correct DME delivered: Lennart Pall 06/22/2018    Jorden Minchey, Lorre Nick

## 2018-06-22 NOTE — Telephone Encounter (Signed)
Please let patient know that if she is admitted then she needs to let her providers at the hospital know that she needs a hospital bed when she goes home and if they do not admit her, I would need to see her for a face to face visit to order a hospital bed and have her insurance pay for it

## 2018-06-22 NOTE — Telephone Encounter (Signed)
Called [facility] again regarding BIPAP--they report that they need an order in the chart with settings--patient will need overnight pulse oximetry for justification despite documentation of need as well as ABG readings. They will take one out if patient willing to take financial responsibility in case of denial--they will contact patient.

## 2018-06-22 NOTE — Progress Notes (Signed)
Placed patient on BIPAP 18/5 with oxygen set at 6lpm.

## 2018-06-22 NOTE — Progress Notes (Signed)
Pt. And her family got d/c instructions and equipment,pt. Is ready to go home.

## 2018-06-22 NOTE — Patient Care Conference (Signed)
Inpatient RehabilitationTeam Conference and Plan of Care Update Date: 06/20/2018   Time: 2:15 PM    Patient Name: Tamara Silva      Medical Record Number: [id number]  Date of Birth: 09-07-1960 Sex: Female         Room/Bed: 4W03C/4W03C-01 Payor Info: Payor: MEDICAID [place] / Plan: MEDICAID [place] ACCESS / Product Type: *No Product type* /    Admitting Diagnosis: stroke  Admit Date/Time:  06/09/2018  2:10 PM Admission Comments: No comment available   Primary Diagnosis:  Encephalopathy Principal Problem: Encephalopathy  Patient Active Problem List   Diagnosis Date Noted  . Chronic respiratory failure with hypoxia and hypercapnia (HCC)   . Labile blood pressure   . Wheezing   . Hyperammonemia ([place])   . Acute on chronic diastolic CHF (congestive heart failure) ([place])   . Supplemental oxygen dependent   . Slow transit constipation   . Encephalopathy 06/09/2018  . Cocaine abuse ([place])   . Diabetes mellitus type 2 in obese ([place])   . Generalized tonic-clonic seizure ([place]) 06/05/2018  . Cerebral embolism with cerebral infarction 06/01/2018  . AKI (acute kidney injury) ([place]) 05/30/2018  . Pressure injury of skin 05/30/2018  . Acute on chronic anemia 05/29/2018  . Chronic respiratory failure with hypoxia, on home O2 therapy ([place]) 01/27/2018  . Iron deficiency anemia 01/27/2018  . Tobacco abuse disorder 10/03/2017  . Dyslipidemia 04/29/2016  . Primary insomnia 04/29/2016  . Hypoxemia 09/08/2015  . Congestive heart failure (CHF) ([place]) 09/08/2015  . Obesity hypoventilation syndrome ([place])   . Microcytic anemia   . Acute encephalopathy   . Uncontrolled type 2 diabetes mellitus with hyperosmolarity without coma ([place])   . Acute on chronic respiratory failure with hypercapnia ([place])   . CAP (community acquired pneumonia)   . OSA (obstructive sleep apnea)   . Acute and chr resp failure, unsp w hypoxia or hypercapnia ([place]) 06/22/2015  . Acute on chronic combined systolic (congestive) and  diastolic (congestive) heart failure ([place]) 06/21/2015  . Hypoglycemia 06/21/2015  . Uncontrolled type 2 diabetes mellitus without complication, with long-term current use of insulin ([place])   . Dyslipidemia associated with type 2 diabetes mellitus ([place]) 05/21/2015  . Anemia of chronic kidney failure 05/21/2015  . Hyperkalemia 05/21/2015  . COPD GOLD III  06/10/2014  . Acute respiratory failure with hypoxia ([place]) 03/04/2014  . CKD (chronic kidney disease), stage II 02/07/2014  . COPD exacerbation ([place]) 02/01/2014  . CAD S/P percutaneous coronary angioplasty - prior PCI to LAD; RCA PCI: new Xience Alpine DES 2.75 mm x 15 mm  10/07/2013  . Morbid obesity ([place]) 07/30/2013  . Chronic combined systolic and diastolic heart failure ([place]) 07/10/2013  . Chronic pulmonary embolism ([place]) 02/08/2013  . Bipolar disorder ([place]) 02/08/2013  . Essential hypertension 02/08/2013  . Chronic anticoagulation 11/27/2012  . Edema extremities 10/15/2012  . COPD (chronic obstructive pulmonary disease) ([place]) 10/15/2012  . Pulmonary embolism on right ([place]) 10/15/2012  . Hypertension 10/15/2012  . Insulin dependent diabetes mellitus ([place]) 10/15/2012    Expected Discharge Date: Expected Discharge Date: 06/22/18  Team Members Present: Physician leading conference: Dr. Alger Simons Social Worker Present: Lennart Pall, LCSW Nurse Present: Dorien Chihuahua, RN PT Present: Other (comment)(Taylor Ervin Knack, PT) OT Present: Darleen Crocker, OT SLP Present: [place] Anna, SLP PPS Coordinator present : Gunnar Fusi     Current Status/Progress Goal Weekly Team Focus  Medical   Patient with ongoing cardiopulmonary complications.  She is faring better with diuresis and with adjustments to her regimen  to decrease her PCO2 levels.  Extra lactulose has helped with ammonia.  However her arousal still can wax and wane.  She remains on BiPAP at night.  May ultimately need trach to help with nightly ventilation however.  Continue  with medication adjustments and management as noted above.  Stabilize breathing and regular sleep patterns.  Continue per above.   Bowel/Bladder   incontient of B/B LBM 01/20  continent of B/B with mod assist  bowel and bladder program toilet q2h    Swallow/Nutrition/ Hydration   Mod I reg/thin   Mod I - goal met       ADL's   Max-Total A self care EOB and bedlevel, Mod A stand pivot transfers with RW. Pt with self limiting behaviors and often refuses tx  Mod A BSC transfer and sit<stand goals  Active therapy participation, caregiver education, d/c planning    Mobility   min A bed mobility, transfers, short distance gait with RW (up to 30')  min A overall  endurance, participation, increasing gait distance   Communication             Safety/Cognition/ Behavioral Observations  Mod-Min A  Min A  basic problem solving, error awareness, organizational thought and selective/divided attention    Pain   denies pain  <2/10  assess and treat pain qshift and prn   Skin   MASD to bilateral breast folds ([place])  dry (eucerin cream)  resolution of current issue no further breakdown or infection  assess skin q shift and prn treatments as ordered    Rehab Goals Patient on target to meet rehab goals: Yes *See Care Plan and progress notes for long and short-term goals.     Barriers to Discharge  Current Status/Progress Possible Resolutions Date Resolved   Physician    Medical stability        Ongoing daily medical management of her numerous issues.  May ultimately need to go home with trach.      Nursing                  PT  Medical stability;Behavior                 OT                  SLP                SW                Discharge Planning/Teaching Needs:  Pt plans to return to her home where she lives with her significant other, [name], who will provide necessary care.  Family ed completed.   Team Discussion:  Fragile medically and MD concerned about community follow up - SW  aware and working with local support services who are arranging Paramedicine program to follow.  Relatively stable currently.  Pt has been refusing some therapies and caregiver says she is at baseline.  Currently min assist overall but min/ mod assist with cognition.  Plan to complete grad day and education tomorrow and d/c Thursday.  Revisions to Treatment Plan:  None    Continued Need for Acute Rehabilitation Level of Care: The patient requires daily medical management by a physician with specialized training in physical medicine and rehabilitation for the following conditions: Daily direction of a multidisciplinary physical rehabilitation program to ensure safe treatment while eliciting the highest outcome that is of practical value to the patient.: Yes Daily medical management of patient  stability for increased activity during participation in an intensive rehabilitation regime.: Yes Daily analysis of laboratory values and/or radiology reports with any subsequent need for medication adjustment of medical intervention for : Mood/behavior problems;Neurological problems   I attest that I was present, lead the team conference, and concur with the assessment and plan of the team.   Yaneli Keithley 06/22/2018, 10:25 AM

## 2018-06-22 NOTE — Telephone Encounter (Signed)
Patient called because they would like to be prescribed a hospital bed because they are in the ED due to having a stroke and a seizure. Please follow up.

## 2018-06-22 NOTE — Progress Notes (Addendum)
[facility] PHYSICAL MEDICINE & REHABILITATION PROGRESS NOTE  Subjective/Complaints: No new issues. Sleeping soundly when I arrived. Refused some meds last night  ROS: Patient denies fever, rash, sore throat, blurred vision, nausea, vomiting, diarrhea, cough, shortness of breath or chest pain, joint or back pain, headache, or mood change.   Objective: Vital Signs: Blood pressure 103/83, pulse 72, temperature 98.6 F (37 C), temperature source Oral, resp. rate 18, height 5' 7.5" (1.715 m), weight (!) 138.2 kg, last menstrual period 07/28/2013, SpO2 100 %. No results found. Recent Labs    06/20/18 1134  WBC 8.5  HGB 7.3*  HCT 28.4*  PLT 235    Recent Labs    06/21/18 0611  NA 138  K 3.5  CL 92*  CO2 38*  GLUCOSE 105*  BUN 13  CREATININE 0.97  CALCIUM 8.7*    Physical Exam: BP 103/83 (BP Location: Left Arm)   Pulse 72   Temp 98.6 F (37 C) (Oral)   Resp 18   Ht 5' 7.5" (1.715 m)   Wt (!) 138.2 kg   LMP 07/28/2013 Comment: perimenopausal  SpO2 100%   BMI 47.02 kg/m  Constitutional: No distress . Vital signs reviewed. HEENT: EOMI, oral membranes moist Neck: supple Cardiovascular: RRR without murmur. No JVD    Respiratory: CTA. Scattered wheezes. Normal effort    GI: BS +, non-tender, non-distended  Musculoskeletal: Edemapresent in bilateral lower extremities.  Neurological:      Alert, follows commands.  Fair insight.  Good sitting balance.  strength is grossly 3+ to 4 out of 5 in all 4   Skin: Skin iswarm.     Psychiatric:   flat  Assessment/Plan: 1. Functional deficits secondary to debility which require 3+ hours per day of interdisciplinary therapy in a comprehensive inpatient rehab setting.  Physiatrist is providing close team supervision and 24 hour management of active medical problems listed below.  Physiatrist and rehab team continue to assess barriers to discharge/monitor patient progress toward functional and medical goals  Care  Tool:  Bathing  Bathing activity did not occur: Refused(stated she was too fatigued today and completed last night) Body parts bathed by patient: Right arm, Left arm, Chest, Face, Right upper leg, Left upper leg(per family report)   Body parts bathed by helper: Abdomen, Front perineal area, Buttocks, Right lower leg, Left lower leg     Bathing assist Assist Level: Moderate Assistance - Patient 50 - 74%     Upper Body Dressing/Undressing Upper body dressing   What is the patient wearing?: Pull over shirt(per family report)    Upper body assist Assist Level: Moderate Assistance - Patient 50 - 74%    Lower Body Dressing/Undressing Lower body dressing      What is the patient wearing?: Pants, Incontinence brief     Lower body assist Assist for lower body dressing: Moderate Assistance - Patient 66 - 74%(per family report)     Naval architect Activity did not occur Landscape architect and hygiene only): (Incontinent)  Toileting assist Assist for toileting: Moderate Assistance - Patient 59 - 74%(per family report)     Transfers Chair/bed transfer  Transfers assist  Chair/bed transfer activity did not occur: Safety/medical concerns  Chair/bed transfer assist level: Minimal Assistance - Patient > 75%     Locomotion Ambulation   Ambulation assist   Ambulation activity did not occur: Safety/medical concerns  Assist level: Minimal Assistance - Patient > 75% Assistive device: Walker-rolling Max distance: 30'   Walk 10 feet activity  Assist  Walk 10 feet activity did not occur: Refused  Assist level: Minimal Assistance - Patient > 75% Assistive device: Walker-rolling   Walk 50 feet activity   Assist Walk 50 feet with 2 turns activity did not occur: Safety/medical concerns         Walk 150 feet activity   Assist Walk 150 feet activity did not occur: Safety/medical concerns         Walk 10 feet on uneven surface  activity   Assist  Walk 10 feet on uneven surfaces activity did not occur: Safety/medical concerns         Wheelchair     Assist Will patient use wheelchair at discharge?: No Type of Wheelchair: Manual Wheelchair activity did not occur: Safety/medical concerns         Wheelchair 50 feet with 2 turns activity    Assist    Wheelchair 50 feet with 2 turns activity did not occur: Safety/medical concerns       Wheelchair 150 feet activity     Assist Wheelchair 150 feet activity did not occur: Safety/medical concerns          Medical Problem List and Plan: 1.Functional deficitssecondary to metabolic encephalopathy, right occipital-parietal infact and debility after multiple medical issues  -Potential discharge home today with intensive home health support and follow-up  -Patient needs active follow-up with primary and pulmonary medicine.  I will not have her follow-up at [facility] health physical medicine and rehab as her primary needs are pulmonary/medical 2. DVT Prophylaxis/Anticoagulation: Pharmaceutical:Lovenox 3. Pain Management:Tylenol as needed 4. Mood:LCSW to follow for evaluation and support 5. Neuropsych: This patientis not fullycapable of making decisions on hisown behalf. 6. Skin/Wound Care:Routine pressure relief measures 7. Fluids/Electrolytes/Nutrition:Monitor I/O.    encourage PO 8. COPD with chronic hypoxic/hypercarbic respiratory failure: oxygen dependent--uses 4 L at baseline.  -  ABG  essentially stable   -pt requires BIPAP as she has failed CPAP and has severe hypercarbia. PCO2 levels have ranged from 60-'s to 90's.   -Needs outpatient pulmonary follow-up    9. OSA/OHS:  Requires HS BIPAP, has failed CPAP.    - nebs prn. 10. Acute on chronic CHF: Continue low-salt diet. On Coreg, statin. May need LFTs checked every 2 months.         Filed Weights   06/20/18 0500 06/21/18 0518 06/22/18 0417  Weight: (!) 139.4 kg (!) 137.6 kg (!) 138.2 kg    Weights are unreliable  - oral Diamox, 250mg  x1 1/19  -requires Close outpatient monitoring 11. Acute on chronic anemia: Improved post transfusion. Now back on ASA--monitor with serial checks.   Hemoglobin 7.9 on 1/15, down to 7.1 1/20--- hemoglobin on 1/21 was 7.3  -Follow-up hemoglobin on Monday per home health 12.Elevated ammonia levels:    Ammonia level 99--->1/16 67---71 1/20  Continue increased lactulose    -Continue to check ammonia levels at home-check level monday 13.Multifactorial encephalopathy: appears to be improving with rx of ammonia, CO2, and CHF  -can wax and wane 14. T2DM: Monitor BS ac/hs. Continue Levemir with SSI. Intake variable --will continue to titrate as indicated.   eating more that more alert, noncompliant with diet however  Increased levemir to 18u qam with some improvement  -follow up as outpt 15.  Morbid obesity  Encourage weight loss 16.  Constipation   moving bowels  -sorbitol prn  -lactulose scheduled-- tolerate loose stools somewhat given ammonia levels 17.  Labile blood pressure   Improved control 1/22  LOS: 13 days  A FACE TO FACE EVALUATION WAS PERFORMED  Meredith Staggers 06/22/2018, 8:41 AM

## 2018-06-22 NOTE — Progress Notes (Signed)
Patient was refusing medications last night. Stated that she is going home today and doesn't want to " have an accident on the ride home". Risks and benefits explained. Continued use of medication as directed at home stressed to patient.

## 2018-06-23 ENCOUNTER — Encounter: Payer: Self-pay | Admitting: Cardiology

## 2018-06-23 NOTE — Telephone Encounter (Signed)
Transition Care Management Follow-up Telephone Call  Date of discharge and from where: 06/22/2018 - [facility]  How have you been since you were released from the hospital? She stated that she is tired. Can't sleep  Any questions or concerns? She said that she did not have any questions and could not talk any longer.she was also running out of minutes on her phone.  She explained that she was tired and couldn't sleep last night. She stated that she wants the BiPAP but understands that she needs to have the testing done first.   Items Reviewed:  Did the pt receive and understand the discharge instructions provided?she said that she has the discharge instructions and did not have any questions.  She said that she could not review the instructions at this time.  She also said that she is running out of minutes on her phone.   Medications obtained and verified? She said that she has all medications and is aware of changes and medications to stop/start taking. She said that she did not need to review medications. No questions reported.   Any new allergies since your discharge? None reported  Do you have support at home? She said that she has 24/7 assistance. Her boyfriend was currently with her.   Other (ie: DME, Home Health, etc) she was requesting a hospital bed but said that she understands that she does not qualify.   She has O2 from [place]. Uses it at 4L/min.   Bi PAP was ordered. She said that [facility] has been out to see her and informed her that she needs to have an overnight oximetry done before the BiPAP can be ordered.  She explained that they left the ONO machine for her.  Wheelchair delivered to her prior to discharge  Home health was ordered through [facility] - RN/PT/OT/ST. The patient said that she has not heard from [facility] yet.   She has been referred to community paramedicine program. When this CM called she asked to speak to [place], [place]. Informed  her that this CM would contact [facility] for her  This CM left message for [facility], EMT informing her that the patient is home and would like to speak to her.    Functional Questionnaire: (I = Independent and D = Dependent) ADL's: needs assistance - provided by family, boyfriend. As per discharge information from rehab, she requires minimal assistance.     Follow up appointments reviewed:    PCP Hospital f/u appt confirmed? 07/05/2018 @ 1050 with [facility] f/u appt confirmed? She needs to confirm appointments with pulmonary - 06/28/2018 @ 1430, and cardiology 07/07/2018 @ 0930  Are transportation arrangements needed? She did not report any problems.   If their condition worsens, is the pt aware to call  their PCP or go to the ED? yes  Was the patient provided with contact information for the PCP's office or ED?  She has the phone number  Was the pt encouraged to call back with questions or concerns? yes

## 2018-06-23 NOTE — Telephone Encounter (Signed)
Have you received any connections with this request?

## 2018-06-24 ENCOUNTER — Inpatient Hospital Stay (HOSPITAL_COMMUNITY)
Admission: EM | Admit: 2018-06-24 | Discharge: 2018-06-28 | DRG: 291 | Disposition: A | Discharge status: Home Health | Payer: Medicaid Other | Attending: Internal Medicine | Admitting: Internal Medicine

## 2018-06-24 ENCOUNTER — Emergency Department (HOSPITAL_COMMUNITY): Payer: Medicaid Other

## 2018-06-24 ENCOUNTER — Other Ambulatory Visit: Payer: Self-pay

## 2018-06-24 ENCOUNTER — Encounter (HOSPITAL_COMMUNITY): Payer: Self-pay | Admitting: Emergency Medicine

## 2018-06-24 DIAGNOSIS — E662 Morbid (severe) obesity with alveolar hypoventilation: Secondary | ICD-10-CM | POA: Diagnosis present

## 2018-06-24 DIAGNOSIS — Z7982 Long term (current) use of aspirin: Secondary | ICD-10-CM

## 2018-06-24 DIAGNOSIS — Z794 Long term (current) use of insulin: Secondary | ICD-10-CM

## 2018-06-24 DIAGNOSIS — Z86711 Personal history of pulmonary embolism: Secondary | ICD-10-CM

## 2018-06-24 DIAGNOSIS — J449 Chronic obstructive pulmonary disease, unspecified: Secondary | ICD-10-CM | POA: Diagnosis present

## 2018-06-24 DIAGNOSIS — E876 Hypokalemia: Secondary | ICD-10-CM | POA: Diagnosis present

## 2018-06-24 DIAGNOSIS — Z9981 Dependence on supplemental oxygen: Secondary | ICD-10-CM

## 2018-06-24 DIAGNOSIS — I10 Essential (primary) hypertension: Secondary | ICD-10-CM | POA: Diagnosis present

## 2018-06-24 DIAGNOSIS — R0602 Shortness of breath: Secondary | ICD-10-CM

## 2018-06-24 DIAGNOSIS — Z87891 Personal history of nicotine dependence: Secondary | ICD-10-CM

## 2018-06-24 DIAGNOSIS — K760 Fatty (change of) liver, not elsewhere classified: Secondary | ICD-10-CM | POA: Diagnosis present

## 2018-06-24 DIAGNOSIS — E119 Type 2 diabetes mellitus without complications: Secondary | ICD-10-CM | POA: Diagnosis present

## 2018-06-24 DIAGNOSIS — J9620 Acute and chronic respiratory failure, unspecified whether with hypoxia or hypercapnia: Secondary | ICD-10-CM | POA: Diagnosis present

## 2018-06-24 DIAGNOSIS — Z8249 Family history of ischemic heart disease and other diseases of the circulatory system: Secondary | ICD-10-CM

## 2018-06-24 DIAGNOSIS — T501X5A Adverse effect of loop [high-ceiling] diuretics, initial encounter: Secondary | ICD-10-CM | POA: Diagnosis present

## 2018-06-24 DIAGNOSIS — E785 Hyperlipidemia, unspecified: Secondary | ICD-10-CM | POA: Diagnosis present

## 2018-06-24 DIAGNOSIS — I110 Hypertensive heart disease with heart failure: Principal | ICD-10-CM | POA: Diagnosis present

## 2018-06-24 DIAGNOSIS — D649 Anemia, unspecified: Secondary | ICD-10-CM | POA: Diagnosis present

## 2018-06-24 DIAGNOSIS — J9621 Acute and chronic respiratory failure with hypoxia: Secondary | ICD-10-CM | POA: Diagnosis present

## 2018-06-24 DIAGNOSIS — Z833 Family history of diabetes mellitus: Secondary | ICD-10-CM

## 2018-06-24 DIAGNOSIS — G4089 Other seizures: Secondary | ICD-10-CM | POA: Diagnosis present

## 2018-06-24 DIAGNOSIS — D509 Iron deficiency anemia, unspecified: Secondary | ICD-10-CM | POA: Diagnosis present

## 2018-06-24 DIAGNOSIS — E669 Obesity, unspecified: Secondary | ICD-10-CM | POA: Diagnosis present

## 2018-06-24 DIAGNOSIS — I5043 Acute on chronic combined systolic (congestive) and diastolic (congestive) heart failure: Secondary | ICD-10-CM | POA: Diagnosis present

## 2018-06-24 DIAGNOSIS — I2510 Atherosclerotic heart disease of native coronary artery without angina pectoris: Secondary | ICD-10-CM | POA: Diagnosis present

## 2018-06-24 DIAGNOSIS — E1169 Type 2 diabetes mellitus with other specified complication: Secondary | ICD-10-CM | POA: Diagnosis present

## 2018-06-24 DIAGNOSIS — R0600 Dyspnea, unspecified: Secondary | ICD-10-CM

## 2018-06-24 DIAGNOSIS — J9622 Acute and chronic respiratory failure with hypercapnia: Secondary | ICD-10-CM | POA: Diagnosis present

## 2018-06-24 DIAGNOSIS — I6340 Cerebral infarction due to embolism of unspecified cerebral artery: Secondary | ICD-10-CM | POA: Diagnosis present

## 2018-06-24 DIAGNOSIS — Z6841 Body Mass Index (BMI) 40.0 and over, adult: Secondary | ICD-10-CM

## 2018-06-24 DIAGNOSIS — Z955 Presence of coronary angioplasty implant and graft: Secondary | ICD-10-CM

## 2018-06-24 DIAGNOSIS — Z79899 Other long term (current) drug therapy: Secondary | ICD-10-CM

## 2018-06-24 DIAGNOSIS — K5900 Constipation, unspecified: Secondary | ICD-10-CM | POA: Diagnosis present

## 2018-06-24 DIAGNOSIS — Z8673 Personal history of transient ischemic attack (TIA), and cerebral infarction without residual deficits: Secondary | ICD-10-CM

## 2018-06-24 LAB — URINALYSIS, ROUTINE W REFLEX MICROSCOPIC
Bacteria, UA: NONE SEEN
Bilirubin Urine: NEGATIVE
Glucose, UA: NEGATIVE mg/dL
Hgb urine dipstick: NEGATIVE
Ketones, ur: NEGATIVE mg/dL
Leukocytes, UA: NEGATIVE
Nitrite: NEGATIVE
Protein, ur: 100 mg/dL — AB
Specific Gravity, Urine: 1.017 (ref 1.005–1.030)
pH: 7 (ref 5.0–8.0)

## 2018-06-24 LAB — COMPREHENSIVE METABOLIC PANEL
ALT: 15 U/L (ref 0–44)
AST: 26 U/L (ref 15–41)
Albumin: 2.4 g/dL — ABNORMAL LOW (ref 3.5–5.0)
Alkaline Phosphatase: 111 U/L (ref 38–126)
Anion gap: 4 — ABNORMAL LOW (ref 5–15)
BUN: 12 mg/dL (ref 6–20)
CO2: 38 mmol/L — ABNORMAL HIGH (ref 22–32)
Calcium: 8.6 mg/dL — ABNORMAL LOW (ref 8.9–10.3)
Chloride: 94 mmol/L — ABNORMAL LOW (ref 98–111)
Creatinine, Ser: 1.04 mg/dL — ABNORMAL HIGH (ref 0.44–1.00)
GFR calc Af Amer: >60 mL/min (ref >60)
GFR calc non Af Amer: 59 mL/min — ABNORMAL LOW (ref >60)
Glucose, Bld: 224 mg/dL — ABNORMAL HIGH (ref 70–99)
Potassium: 3.5 mmol/L (ref 3.5–5.1)
Sodium: 136 mmol/L (ref 135–145)
TOTAL PROTEIN: 8.6 g/dL — AB (ref 6.5–8.1)
Total Bilirubin: 1.2 mg/dL (ref 0.3–1.2)

## 2018-06-24 LAB — I-STAT TROPONIN, ED
Troponin i, poc: 0.02 ng/mL (ref 0.00–0.08)
Troponin i, poc: 0.02 ng/mL (ref 0.00–0.08)

## 2018-06-24 LAB — BRAIN NATRIURETIC PEPTIDE: B Natriuretic Peptide: 652.9 pg/mL — ABNORMAL HIGH (ref 0.0–100.0)

## 2018-06-24 LAB — MAGNESIUM: Magnesium: 1.7 mg/dL (ref 1.7–2.4)

## 2018-06-24 NOTE — ED Triage Notes (Signed)
Pt coming by EMS for shortness of breath. Says that her bipap she uses at night has recently broke so she is unable to use one at night until Monday when she has one delivered. Wears 4 L [place] consistently at home.

## 2018-06-24 NOTE — ED Provider Notes (Addendum)
[facility] EMERGENCY DEPARTMENT Provider Note   CSN: [id number] Arrival date & time: 06/24/18  1921     History   Chief Complaint Chief Complaint  Patient presents with  . Shortness of Breath    HPI Tamara Silva is a 58 y.o. female.  The history is provided by the patient and medical records.  Shortness of Breath  Severity:  Severe Onset quality:  Gradual Duration:  2 days Timing:  Constant Progression:  Worsening Chronicity:  Recurrent Relieved by:  Nothing Worsened by:  Nothing Ineffective treatments:  None tried Associated symptoms: cough, sputum production and wheezing   Associated symptoms: no abdominal pain, no chest pain, no diaphoresis, no fever, no headaches, no hemoptysis, no neck pain, no rash and no vomiting   Risk factors: obesity     Past Medical History:  Diagnosis Date  . Asthma   . CAD (coronary artery disease)   . Chronic combined systolic (congestive) and diastolic (congestive) heart failure ([place])   . Chronic iron deficiency anemia   . Diabetes mellitus without complication ([place])   . DOE (dyspnea on exertion)   . Edema of both legs   . Hepatic steatosis   . Hypertension   . Pulmonary emboli [facility]) 2014    Patient Active Problem List   Diagnosis Date Noted  . Hepatic steatosis 06/22/2018  . Chronic respiratory failure with hypoxia and hypercapnia (HCC)   . Hyperammonemia ([place])   . Acute on chronic diastolic CHF (congestive heart failure) ([place])   . Supplemental oxygen dependent   . Slow transit constipation   . Encephalopathy 06/09/2018  . Cocaine abuse ([place])   . Diabetes mellitus type 2 in obese ([place])   . Generalized tonic-clonic seizure ([place]) 06/05/2018  . Cerebral embolism with cerebral infarction 06/01/2018  . AKI (acute kidney injury) ([place]) 05/30/2018  . Pressure injury of skin 05/30/2018  . Acute on chronic anemia 05/29/2018  . Chronic respiratory failure with hypoxia, on home O2 therapy ([place]) 01/27/2018  .  Iron deficiency anemia 01/27/2018  . Tobacco abuse disorder 10/03/2017  . Dyslipidemia 04/29/2016  . Primary insomnia 04/29/2016  . Hypoxemia 09/08/2015  . Congestive heart failure (CHF) ([place]) 09/08/2015  . Obesity hypoventilation syndrome ([place])   . Microcytic anemia   . Acute encephalopathy   . Uncontrolled type 2 diabetes mellitus with hyperosmolarity without coma ([place])   . Acute on chronic respiratory failure with hypercapnia ([place])   . CAP (community acquired pneumonia)   . OSA (obstructive sleep apnea)   . Acute and chr resp failure, unsp w hypoxia or hypercapnia ([place]) 06/22/2015  . Acute on chronic combined systolic (congestive) and diastolic (congestive) heart failure ([place]) 06/21/2015  . Hypoglycemia 06/21/2015  . Uncontrolled type 2 diabetes mellitus without complication, with long-term current use of insulin ([place])   . Dyslipidemia associated with type 2 diabetes mellitus ([place]) 05/21/2015  . Anemia of chronic kidney failure 05/21/2015  . Hyperkalemia 05/21/2015  . COPD GOLD III  06/10/2014  . Acute respiratory failure with hypoxia ([place]) 03/04/2014  . CKD (chronic kidney disease), stage II 02/07/2014  . COPD exacerbation ([place]) 02/01/2014  . CAD S/P percutaneous coronary angioplasty - prior PCI to LAD; RCA PCI: new Xience Alpine DES 2.75 mm x 15 mm  10/07/2013  . Morbid obesity ([place]) 07/30/2013  . Chronic combined systolic and diastolic heart failure ([place]) 07/10/2013  . Chronic pulmonary embolism ([place]) 02/08/2013  . Bipolar disorder ([place]) 02/08/2013  . Essential hypertension 02/08/2013  . Chronic anticoagulation 11/27/2012  .  Edema extremities 10/15/2012  . COPD (chronic obstructive pulmonary disease) ([place]) 10/15/2012  . Pulmonary embolism on right ([place]) 10/15/2012  . Hypertension 10/15/2012  . Insulin dependent diabetes mellitus ([place]) 10/15/2012    Past Surgical History:  Procedure Laterality Date  . COLONOSCOPY WITH PROPOFOL N/A 08/03/2013   Procedure: COLONOSCOPY WITH  PROPOFOL;  Surgeon: Jeryl [place], MD;  Location: [facility] ENDOSCOPY;  Service: Endoscopy;  Laterality: N/A;  . CORONARY ANGIOPLASTY WITH STENT PLACEMENT     CAD in 2006 x 2 and 2009 2 more- place din REx in [place] and [place] med  . CORONARY ANGIOPLASTY WITH STENT PLACEMENT  10/07/2013   Xience Alpine DES 2.75  mm x 15  mm  . ESOPHAGOGASTRODUODENOSCOPY (EGD) WITH PROPOFOL N/A 08/03/2013   Procedure: ESOPHAGOGASTRODUODENOSCOPY (EGD) WITH PROPOFOL;  Surgeon: Jeryl [place], MD;  Location: [facility] ENDOSCOPY;  Service: Endoscopy;  Laterality: N/A;  . LEFT HEART CATHETERIZATION WITH CORONARY ANGIOGRAM N/A 10/07/2013   Procedure: LEFT HEART CATHETERIZATION WITH CORONARY ANGIOGRAM;  Surgeon: Leonie Man, MD;  Location: [facility] CATH LAB;  Service: Cardiovascular;  Laterality: N/A;     OB History   No obstetric history on file.      Home Medications    Prior to Admission medications   Medication Sig Start Date End Date Taking? Authorizing Provider  acetaminophen (TYLENOL) 325 MG tablet Take 1-2 tablets (325-650 mg total) by mouth every 4 (four) hours as needed for mild pain. 06/22/18   Love, Ivan Anchors, PA-C  aspirin 81 MG tablet Take 1 tablet (81 mg total) by mouth daily. 10/09/13   Barrett, Evelene Croon, PA-C  carvedilol (COREG) 3.125 MG tablet Take 1 tablet (3.125 mg total) by mouth 2 (two) times daily with a meal. 06/22/18   Love, Ivan Anchors, PA-C  hydrocerin (EUCERIN) CREA Apply 1 application topically 2 (two) times daily. 06/22/18   Love, Ivan Anchors, PA-C  Insulin Detemir (LEVEMIR FLEXTOUCH) 100 UNIT/ML Pen Inject 18 Units into the skin daily. 06/22/18   Love, Ivan Anchors, PA-C  ipratropium-albuterol (DUONEB) 0.5-2.5 (3) MG/3ML SOLN Use nebulizer to inhale contents of one vial every 6 hours as needed for shortness of breath, wheezing 06/22/18   Love, Ivan Anchors, PA-C  lactulose (CHRONULAC) 10 GM/15ML solution Take 45 mLs (30 g total) by mouth 2 (two) times daily. 06/22/18   Love, Ivan Anchors, PA-C  NEEDLE, DISP, 26 G 26G X 1/2" MISC 1  application by Does not apply route daily. 06/22/18   Love, Ivan Anchors, PA-C  nitroGLYCERIN (NITROSTAT) 0.4 MG SL tablet Place 1 tablet (0.4 mg total) under the tongue every 5 (five) minutes as needed for chest pain. 01/27/18   Fulp, Cammie, MD  omeprazole (PRILOSEC) 40 MG capsule Take 1 capsule (40 mg total) by mouth daily. 06/22/18   Love, Ivan Anchors, PA-C  rosuvastatin (CRESTOR) 40 MG tablet Take 1 tablet (40 mg total) by mouth daily at 6 PM. 06/22/18   Love, Ivan Anchors, PA-C    Family History Family History  Problem Relation Age of Onset  . Stroke Mother   . Hypertension Mother   . Colon cancer Father   . Diabetes Father   . Heart attack Father   . Sarcoidosis Other   . Breast cancer Paternal Aunt   . Emphysema Brother   . Lung disease Brother        Unknown type, 3 brothers, one with liver and lung disease  . Asthma Paternal Aunt     Social History Social History  Tobacco Use  . Smoking status: Former Smoker    Packs/day: 0.50    Years: 20.00    Pack years: 10.00    Types: Cigarettes    Last attempt to quit: 04/30/2014    Years since quitting: 4.1  . Smokeless tobacco: Never Used  . Tobacco comment: smoked off and on x 20 years  Substance Use Topics  . Alcohol use: No    Alcohol/week: 0.0 standard drinks  . Drug use: No     Allergies   Metolazone; Plavix [clopidogrel bisulfate]; Ibuprofen; and Nsaids   Review of Systems Review of Systems  Constitutional: Positive for chills and fatigue. Negative for diaphoresis and fever.  HENT: Positive for congestion.   Eyes: Negative for visual disturbance.  Respiratory: Positive for cough, sputum production, chest tightness, shortness of breath and wheezing. Negative for hemoptysis and stridor.   Cardiovascular: Positive for leg swelling. Negative for chest pain and palpitations.  Gastrointestinal: Negative for abdominal pain, constipation, diarrhea, nausea and vomiting.  Genitourinary: Negative for dysuria, flank pain and  frequency.  Musculoskeletal: Negative for back pain, neck pain and neck stiffness.  Skin: Negative for rash and wound.  Neurological: Negative for light-headedness and headaches.  Psychiatric/Behavioral: Negative for agitation.  All other systems reviewed and are negative.    Physical Exam Updated Vital Signs LMP 07/28/2013 Comment: perimenopausal  Physical Exam Vitals signs and nursing note reviewed.  Constitutional:      General: She is not in acute distress.    Appearance: She is well-developed. She is not ill-appearing, toxic-appearing or diaphoretic.  HENT:     Head: Normocephalic and atraumatic.     Mouth/Throat:     Pharynx: No pharyngeal swelling or oropharyngeal exudate.  Eyes:     Conjunctiva/sclera: Conjunctivae normal.     Pupils: Pupils are equal, round, and reactive to light.  Neck:     Musculoskeletal: Neck supple.  Cardiovascular:     Rate and Rhythm: Normal rate and regular rhythm.     Heart sounds: No murmur.  Pulmonary:     Effort: Pulmonary effort is normal. Tachypnea present. No respiratory distress.     Breath sounds: Rhonchi and rales present. No decreased breath sounds or wheezing.  Chest:     Chest wall: No tenderness.  Abdominal:     Palpations: Abdomen is soft.     Tenderness: There is no abdominal tenderness.  Musculoskeletal:     Right lower leg: She exhibits no tenderness. Edema present.     Left lower leg: She exhibits no tenderness. Edema present.  Skin:    General: Skin is warm and dry.     Capillary Refill: Capillary refill takes less than 2 seconds.  Neurological:     General: No focal deficit present.     Mental Status: She is alert.      ED Treatments / Results  Labs (all labs ordered are listed, but only abnormal results are displayed) Labs Reviewed  BRAIN NATRIURETIC PEPTIDE - Abnormal; Notable for the following components:      Result Value   B Natriuretic Peptide 652.9 (*)    All other components within normal limits    CBC WITH DIFFERENTIAL/PLATELET - Abnormal; Notable for the following components:   RBC 2.90 (*)    Hemoglobin 6.9 (*)    HCT 26.1 (*)    MCH 23.8 (*)    MCHC 26.4 (*)    nRBC 0.8 (*)    All other components within normal limits  COMPREHENSIVE METABOLIC PANEL -  Abnormal; Notable for the following components:   Chloride 94 (*)    CO2 38 (*)    Glucose, Bld 224 (*)    Creatinine, Ser 1.04 (*)    Calcium 8.6 (*)    Total Protein 8.6 (*)    Albumin 2.4 (*)    GFR calc non Af Amer 59 (*)    Anion gap 4 (*)    All other components within normal limits  URINALYSIS, ROUTINE W REFLEX MICROSCOPIC - Abnormal; Notable for the following components:   APPearance HAZY (*)    Protein, ur 100 (*)    All other components within normal limits  URINE CULTURE  MAGNESIUM  CBC  BASIC METABOLIC PANEL  I-STAT TROPONIN, ED  I-STAT TROPONIN, ED  PREPARE RBC (CROSSMATCH)  TYPE AND SCREEN    EKG EKG Interpretation  Date/Time:  Saturday June 24 2018 19:35:09 EST Ventricular Rate:  81 PR Interval:    QRS Duration: 106 QT Interval:  402 QTC Calculation: 467 R Axis:   84 Text Interpretation:  Sinus rhythm Ventricular premature complex Consider anterior infarct When compared to prior, new PVC.  No STEMI Confirmed by Antony Blackbird [phone]) on 06/24/2018 7:38:02 PM   Radiology Dg Chest 2 View  Result Date: 06/24/2018 CLINICAL DATA:  Shortness of breath. EXAM: CHEST - 2 VIEW COMPARISON:  06/12/2018. FINDINGS: Stable enlarged cardiac silhouette and prominent interstitial markings. Small pleural effusions. Stable scarring in the left lateral chest. Unremarkable bones. IMPRESSION: Stable cardiomegaly and mild changes of congestive heart failure. Electronically Signed   By: Claudie Revering M.D.   On: 06/24/2018 20:16    Procedures Procedures (including critical care time)   CRITICAL CARE Performed by: Gwenyth Allegra  Total critical care time: 35 minutes Critical care time was exclusive of  separately billable procedures and treating other patients. Critical care was necessary to treat or prevent imminent or life-threatening deterioration. Critical care was time spent personally by me on the following activities: development of treatment plan with patient and/or surrogate as well as nursing, discussions with consultants, evaluation of patient's response to treatment, examination of patient, obtaining history from patient or surrogate, ordering and performing treatments and interventions, ordering and review of laboratory studies, ordering and review of radiographic studies, pulse oximetry and re-evaluation of patient's condition.  Medications Ordered in ED Medications  0.9 %  sodium chloride infusion (Manually program via Guardrails IV Fluids) (has no administration in time range)  sodium chloride flush (NS) 0.9 % injection 3 mL (has no administration in time range)  sodium chloride flush (NS) 0.9 % injection 3 mL (has no administration in time range)  0.9 %  sodium chloride infusion (has no administration in time range)  acetaminophen (TYLENOL) tablet 650 mg (has no administration in time range)  ondansetron (ZOFRAN) injection 4 mg (has no administration in time range)  enoxaparin (LOVENOX) injection 40 mg (has no administration in time range)  aspirin chewable tablet 81 mg (has no administration in time range)  carvedilol (COREG) tablet 3.125 mg (has no administration in time range)  insulin aspart (novoLOG) injection 0-15 Units (has no administration in time range)  insulin detemir (LEVEMIR) injection 10 Units (has no administration in time range)  ipratropium-albuterol (DUONEB) 0.5-2.5 (3) MG/3ML nebulizer solution 3 mL (has no administration in time range)  lactulose (CHRONULAC) 10 GM/15ML solution 30 g (has no administration in time range)  rosuvastatin (CRESTOR) tablet 40 mg (has no administration in time range)  pantoprazole (PROTONIX) EC tablet 80 mg (has no administration  in time range)  furosemide (LASIX) injection 40 mg (40 mg Intravenous Given 06/25/18 0121)     Initial Impression / Assessment and Plan / ED Course  I have reviewed the triage vital signs and the nursing notes.  Pertinent labs & imaging results that were available during my care of the patient were reviewed by me and considered in my medical decision making (see chart for details).     Tamara Silva is a 58 y.o. female with a past medical history significant for morbid obesity, chronic pulmonary embolism, CAD status post PCI, CHF, hypertension, hyperlipidemia, uncontrolled diabetes, COPD on 4 L at home recently discharged with recommendations for BiPAP, and recent GI bleed with anemia who presents with worsening shortness of breath, congestion, cough, fatigue, malaise, and inability to have the recommended BiPAP.  Patient reports that she was discharged 2 days ago from extended stay in the hospital.  She reports that her shortness of breath has returned and is continuing.  She says that her BiPAP machine broke and she is not going to be able to receive 1 for 2 days until Monday.  She reports that she is still having a productive cough with a clear phlegm.  She is having chest tightness but no chest pain.  She is having severe shortness of breath and her oxygen saturations have required her oxygen to be increased to 6 L above her normal 4 L level.  She reports no urinary symptoms or GI symptoms.  She reports her legs are slightly more edematous than they were at discharge.  She denies any leg pain.  No other complaints on arrival.  On exam, patient has crackles and coarseness in the bases of her lungs.  Patient is having productive cough.  Chest was nontender, abdomen nontender.  Legs are edematous.  Oral temperature was 99.1 and patient was tachypneic on arrival.  EKG shows no STEMI.     Given patient's history I am concerned about recurrent fluid overload, worsening symptomatic anemia, development  of pneumonia, or COPD exacerbation.  X-ray shows similar heart failure and fluid in the lungs.  BNP elevated at 600 but is improved from her admission BNP last month of 1200.  Patient's anemia has down trended to 6.9 from 8.1 on the 13th and 7.3 four days ago.  Patient's urine shows no infection.  Magnesium normal.  Troponin negative.  Given patient's downtrending hemoglobin, continued elevation of BNP, increased oxygen requirement, persistent shortness of breath and fatigue, as well as her report that she has no ability to use BiPAP as recommended at discharge for the next 2 days, I am concerned about her going home.  Hospitalist will be called for readmission for further trending of hemoglobin and consideration of transfusion again.  She will likely need diuresis and further management for her worsening shortness of breath.  Hospitalist team called for admission..   Final Clinical Impressions(s) / ED Diagnoses   Final diagnoses:  SOB (shortness of breath)    ED Discharge Orders    None     Clinical Impression: 1. SOB (shortness of breath)     Disposition: Admit  This note was prepared with assistance of Dragon voice recognition software. Occasional wrong-word or sound-a-like substitutions may have occurred due to the inherent limitations of voice recognition software.     , Gwenyth Allegra, MD 06/25/18 0132    , Gwenyth Allegra, MD 07/03/18 1101

## 2018-06-24 NOTE — ED Notes (Signed)
CRITICAL VALUE ALERT  Critical Value: HGB 6.9  Date & Time Notied:  06/24/18 @ 2300  Provider Notified: Yes  Orders Received/Actions taken: Will continue to monitor

## 2018-06-25 ENCOUNTER — Encounter (HOSPITAL_COMMUNITY): Payer: Self-pay

## 2018-06-25 DIAGNOSIS — R0602 Shortness of breath: Secondary | ICD-10-CM

## 2018-06-25 DIAGNOSIS — I10 Essential (primary) hypertension: Secondary | ICD-10-CM | POA: Diagnosis not present

## 2018-06-25 DIAGNOSIS — E1169 Type 2 diabetes mellitus with other specified complication: Secondary | ICD-10-CM | POA: Diagnosis not present

## 2018-06-25 DIAGNOSIS — I5043 Acute on chronic combined systolic (congestive) and diastolic (congestive) heart failure: Secondary | ICD-10-CM

## 2018-06-25 DIAGNOSIS — E662 Morbid (severe) obesity with alveolar hypoventilation: Secondary | ICD-10-CM

## 2018-06-25 DIAGNOSIS — E669 Obesity, unspecified: Secondary | ICD-10-CM

## 2018-06-25 DIAGNOSIS — D649 Anemia, unspecified: Secondary | ICD-10-CM

## 2018-06-25 LAB — GLUCOSE, CAPILLARY
Glucose-Capillary: 213 mg/dL — ABNORMAL HIGH (ref 70–99)
Glucose-Capillary: 165 mg/dL — ABNORMAL HIGH (ref 70–99)
Glucose-Capillary: 154 mg/dL — ABNORMAL HIGH (ref 70–99)
Glucose-Capillary: 232 mg/dL — ABNORMAL HIGH (ref 70–99)
Glucose-Capillary: 189 mg/dL — ABNORMAL HIGH (ref 70–99)
Glucose-Capillary: 193 mg/dL — ABNORMAL HIGH (ref 70–99)

## 2018-06-25 LAB — BASIC METABOLIC PANEL
Anion gap: 9 (ref 5–15)
BUN: 9 mg/dL (ref 6–20)
CALCIUM: 8.6 mg/dL — AB (ref 8.9–10.3)
CO2: 35 mmol/L — ABNORMAL HIGH (ref 22–32)
Chloride: 93 mmol/L — ABNORMAL LOW (ref 98–111)
Creatinine, Ser: 1.04 mg/dL — ABNORMAL HIGH (ref 0.44–1.00)
GFR calc Af Amer: >60 mL/min (ref >60)
GFR calc non Af Amer: 59 mL/min — ABNORMAL LOW (ref >60)
GLUCOSE: 175 mg/dL — AB (ref 70–99)
Potassium: 3.6 mmol/L (ref 3.5–5.1)
Sodium: 137 mmol/L (ref 135–145)

## 2018-06-25 LAB — CBC
HCT: 26.9 % — ABNORMAL LOW (ref 36.0–46.0)
Hemoglobin: 7.4 g/dL — ABNORMAL LOW (ref 12.0–15.0)
MCH: 23.9 pg — ABNORMAL LOW (ref 26.0–34.0)
MCHC: 27.5 g/dL — ABNORMAL LOW (ref 30.0–36.0)
MCV: 87.1 fL (ref 80.0–100.0)
Platelets: 245 10*3/uL (ref 150–400)
RBC: 3.09 MIL/uL — ABNORMAL LOW (ref 3.87–5.11)
RDW: 31.8 % — AB (ref 11.5–15.5)
WBC: 6.7 10*3/uL (ref 4.0–10.5)
nRBC: 0.7 % — ABNORMAL HIGH (ref 0.0–0.2)

## 2018-06-25 LAB — PREPARE RBC (CROSSMATCH)

## 2018-06-25 LAB — HEMOGLOBIN AND HEMATOCRIT, BLOOD
HCT: 29.4 % — ABNORMAL LOW (ref 36.0–46.0)
Hemoglobin: 8.3 g/dL — ABNORMAL LOW (ref 12.0–15.0)

## 2018-06-25 MED ORDER — SODIUM CHLORIDE 0.9% IV SOLUTION
Freq: Once | INTRAVENOUS | Status: AC
  Administered 2018-06-25: 03:00:00 via INTRAVENOUS

## 2018-06-25 MED ORDER — SODIUM CHLORIDE 0.9% FLUSH: 3.0000 mL | INTRAVENOUS | Status: DC | PRN

## 2018-06-25 MED ORDER — ASPIRIN 81 MG PO CHEW
81.0000 mg | CHEWABLE_TABLET | Freq: Every day | ORAL | Status: DC
  Administered 2018-06-25 – 2018-06-28 (×4): 81 mg via ORAL
  Filled 2018-06-25 (×4): qty 1

## 2018-06-25 MED ORDER — INSULIN ASPART 100 UNIT/ML ~~LOC~~ SOLN
0.0000 [IU] | Freq: Three times a day (TID) | SUBCUTANEOUS | Status: DC
  Administered 2018-06-25: 5 [IU] via SUBCUTANEOUS
  Administered 2018-06-25 – 2018-06-27 (×5): 3 [IU] via SUBCUTANEOUS
  Administered 2018-06-27: 2 [IU] via SUBCUTANEOUS
  Administered 2018-06-27: 3 [IU] via SUBCUTANEOUS

## 2018-06-25 MED ORDER — ACETAMINOPHEN 325 MG PO TABS
650.0000 mg | ORAL_TABLET | ORAL | Status: DC | PRN
  Administered 2018-06-25 – 2018-06-26 (×2): 650 mg via ORAL
  Filled 2018-06-25 (×2): qty 2

## 2018-06-25 MED ORDER — PANTOPRAZOLE SODIUM 40 MG PO TBEC
80.0000 mg | DELAYED_RELEASE_TABLET | Freq: Every day | ORAL | Status: DC
  Administered 2018-06-25 – 2018-06-28 (×4): 80 mg via ORAL
  Filled 2018-06-25 (×4): qty 2

## 2018-06-25 MED ORDER — ENOXAPARIN SODIUM 40 MG/0.4ML ~~LOC~~ SOLN
40.0000 mg | SUBCUTANEOUS | Status: DC
  Administered 2018-06-25 – 2018-06-27 (×3): 40 mg via SUBCUTANEOUS
  Filled 2018-06-25 (×3): qty 0.4

## 2018-06-25 MED ORDER — ROSUVASTATIN CALCIUM 20 MG PO TABS
40.0000 mg | ORAL_TABLET | Freq: Every day | ORAL | Status: DC
  Administered 2018-06-25 – 2018-06-27 (×3): 40 mg via ORAL
  Filled 2018-06-25: qty 8
  Filled 2018-06-25 (×3): qty 2

## 2018-06-25 MED ORDER — FUROSEMIDE 10 MG/ML IJ SOLN
40.0000 mg | Freq: Once | INTRAMUSCULAR | Status: AC
  Administered 2018-06-25: 40 mg via INTRAVENOUS
  Filled 2018-06-25: qty 4

## 2018-06-25 MED ORDER — FUROSEMIDE 10 MG/ML IJ SOLN
40.0000 mg | Freq: Two times a day (BID) | INTRAMUSCULAR | Status: DC
  Administered 2018-06-25 (×2): 40 mg via INTRAVENOUS
  Filled 2018-06-25 (×3): qty 4

## 2018-06-25 MED ORDER — ONDANSETRON HCL 4 MG/2ML IJ SOLN: 4.0000 mg | Freq: Four times a day (QID) | INTRAMUSCULAR | Status: DC | PRN

## 2018-06-25 MED ORDER — FERROUS GLUCONATE 324 (38 FE) MG PO TABS
324.0000 mg | ORAL_TABLET | Freq: Three times a day (TID) | ORAL | Status: DC
  Administered 2018-06-25 – 2018-06-28 (×9): 324 mg via ORAL
  Filled 2018-06-25 (×11): qty 1

## 2018-06-25 MED ORDER — SODIUM CHLORIDE 0.9 % IV SOLN: 250.0000 mL | INTRAVENOUS | Status: DC | PRN

## 2018-06-25 MED ORDER — SODIUM CHLORIDE 0.9% IV SOLUTION: Freq: Once | INTRAVENOUS | Status: DC

## 2018-06-25 MED ORDER — SODIUM CHLORIDE 0.9% FLUSH
3.0000 mL | Freq: Two times a day (BID) | INTRAVENOUS | Status: DC
  Administered 2018-06-25 – 2018-06-27 (×7): 3 mL via INTRAVENOUS

## 2018-06-25 MED ORDER — ACETAMINOPHEN 325 MG PO TABS: 325.0000 mg | ORAL_TABLET | ORAL | Status: DC | PRN

## 2018-06-25 MED ORDER — INSULIN DETEMIR 100 UNIT/ML ~~LOC~~ SOLN
10.0000 [IU] | Freq: Every day | SUBCUTANEOUS | Status: DC
  Administered 2018-06-25 – 2018-06-27 (×4): 10 [IU] via SUBCUTANEOUS
  Filled 2018-06-25 (×4): qty 0.1

## 2018-06-25 MED ORDER — LACTULOSE 10 GM/15ML PO SOLN
30.0000 g | Freq: Two times a day (BID) | ORAL | Status: DC
  Administered 2018-06-25 – 2018-06-28 (×7): 30 g via ORAL
  Filled 2018-06-25 (×7): qty 45

## 2018-06-25 MED ORDER — IPRATROPIUM-ALBUTEROL 0.5-2.5 (3) MG/3ML IN SOLN
3.0000 mL | Freq: Four times a day (QID) | RESPIRATORY_TRACT | Status: DC | PRN
  Administered 2018-06-25 – 2018-06-26 (×2): 3 mL via RESPIRATORY_TRACT
  Filled 2018-06-25: qty 3

## 2018-06-25 MED ORDER — CARVEDILOL 3.125 MG PO TABS
3.1250 mg | ORAL_TABLET | Freq: Two times a day (BID) | ORAL | Status: DC
  Administered 2018-06-25 – 2018-06-28 (×7): 3.125 mg via ORAL
  Filled 2018-06-25 (×7): qty 1

## 2018-06-25 MED ORDER — NYSTATIN 100000 UNIT/GM EX POWD
Freq: Two times a day (BID) | CUTANEOUS | Status: DC
  Administered 2018-06-25 – 2018-06-27 (×6): via TOPICAL
  Filled 2018-06-25: qty 15

## 2018-06-25 NOTE — Plan of Care (Signed)
  Problem: Education: Goal: Ability to demonstrate management of disease process will improve Outcome: Progressing   Problem: Education: Goal: Individualized Educational Video(s) Outcome: Progressing   Problem: Activity: Goal: Capacity to carry out activities will improve Outcome: Progressing   Problem: Safety: Goal: Ability to remain free from injury will improve Outcome: Progressing   Problem: Skin Integrity: Goal: Risk for impaired skin integrity will decrease Outcome: Progressing   Problem: Safety: Goal: Verbalization of understanding the information provided will improve Outcome: Progressing

## 2018-06-25 NOTE — Care Management (Addendum)
Patient continues to exhibit signs of hypercapnia associated with chronic respiratory failure secondary to severe OSA, COPD, and chronic hypoxic respiratory failure. Patient requires the use of NIV both at sleep and in the daytime to help with exacerbation periods. The use of the NIV will treat both the patients high PCO2 levels and can reduce the risk of exacerbations and future hospitalizations when used at night and during the day. The patient will need these advanced settings in conjunction with their current medication regimen; BIPAP is not an option due to its functional limitations and the severity of the patient's condition. Failure to have NIV available for use over a 24 hour period could lead to death. Patient is able to clear airway and manage secretions.

## 2018-06-25 NOTE — H&P (Signed)
History and Physical    Tamara Silva VCB:[id number] DOB: [birth year] DOA: 06/24/2018  PCP: Antony Blackbird, MD  Patient coming from: Home  I have personally briefly reviewed patient's old medical records in [place]  Chief Complaint: SOB  HPI: Tamara Silva is a 58 y.o. female with medical history significant of OSA, COPD, chronic hypoxic respiratory failure(4 L at home), iron deficiency anemia.  Patient was admitted to hospital from 12/30 to 1/10 with  acute anemia with hgb 5.7, a right MCA/PCA watershed area stroke and a 2 cm infarct in the inferior right parietal lobe (felt to be incidental), with hypercapnic respiratory failure.  Had tonic-clonic seizure and was intubated.  Ultimately extubated and discharged on 1/10 with HGB 8.x (3u PRBC transfusion) and nightly BIPAP.  She then spent from 1/10 to 1/23 in the rehab.  HGB has slowly trended down during that time and was 7.3 on 1/21.  Patient was discharged home x2 days ago on 1/23; however, unfortunately patient (who is now BIPAP dependent at night) does not have a BIPAP at home.  She had been trying to increase O2 at night to 6L via [place], but remains SOB, and returns to the ED today.   ED Course: HGB 6.9.  CXR shows mild CHF findings.  BNP 600.   Review of Systems: As per HPI otherwise 10 point review of systems negative.   Past Medical History:  Diagnosis Date  . Asthma   . CAD (coronary artery disease)   . Chronic combined systolic (congestive) and diastolic (congestive) heart failure ([place])   . Chronic iron deficiency anemia   . Diabetes mellitus without complication ([place])   . DOE (dyspnea on exertion)   . Edema of both legs   . Hepatic steatosis   . Hypertension   . Pulmonary emboli ([place]) 2014    Past Surgical History:  Procedure Laterality Date  . COLONOSCOPY WITH PROPOFOL N/A 08/03/2013   Procedure: COLONOSCOPY WITH PROPOFOL;  Surgeon: Jeryl Columbia, MD;  Location: [facility] ENDOSCOPY;  Service: Endoscopy;   Laterality: N/A;  . CORONARY ANGIOPLASTY WITH STENT PLACEMENT     CAD in 2006 x 2 and 2009 2 more- place din REx in [place] and [place] med  . CORONARY ANGIOPLASTY WITH STENT PLACEMENT  10/07/2013   Xience [place] DES 2.75  mm x 15  mm  . ESOPHAGOGASTRODUODENOSCOPY (EGD) WITH PROPOFOL N/A 08/03/2013   Procedure: ESOPHAGOGASTRODUODENOSCOPY (EGD) WITH PROPOFOL;  Surgeon: Jeryl Columbia, MD;  Location: [facility];  Service: Endoscopy;  Laterality: N/A;  . LEFT HEART CATHETERIZATION WITH CORONARY ANGIOGRAM N/A 10/07/2013   Procedure: LEFT HEART CATHETERIZATION WITH CORONARY ANGIOGRAM;  Surgeon: Leonie Man, MD;  [facility];  Service: Cardiovascular;  Laterality: N/A;     reports that she quit smoking about 4 years ago. Her smoking use included cigarettes. She has a 10.00 pack-year smoking history. She has never used smokeless tobacco. She reports that she does not drink alcohol or use drugs.  Allergies  Allergen Reactions  . Metolazone Other (See Comments)    Dizziness and falling  . Plavix [Clopidogrel Bisulfate] Other (See Comments)    High PRU's - non-responder  . Ibuprofen     Patient stated she can not take ibuprofen.   . Nsaids Other (See Comments)    "I do not take NSAIDs d/t interfering with other meds"    Family History  Problem Relation Age of Onset  . Stroke Mother   . Hypertension Mother   .  Colon cancer Father   . Diabetes Father   . Heart attack Father   . Sarcoidosis Other   . Breast cancer Paternal Aunt   . Emphysema Brother   . Lung disease Brother        Unknown type, 3 brothers, one with liver and lung disease  . Asthma Paternal Aunt      Prior to Admission medications   Medication Sig Start Date End Date Taking? Authorizing Provider  acetaminophen (TYLENOL) 325 MG tablet Take 1-2 tablets (325-650 mg total) by mouth every 4 (four) hours as needed for mild pain. 06/22/18  Yes Love, Ivan Anchors, PA-C  aspirin 81 MG tablet Take 1 tablet (81 mg total) by  mouth daily. 10/09/13  Yes Barrett, Evelene Croon, PA-C  carvedilol (COREG) 3.125 MG tablet Take 1 tablet (3.125 mg total) by mouth 2 (two) times daily with a meal. 06/22/18  Yes Love, Ivan Anchors, PA-C  hydrocerin (EUCERIN) CREA Apply 1 application topically 2 (two) times daily. 06/22/18  Yes Love, Ivan Anchors, PA-C  Insulin Detemir (LEVEMIR FLEXTOUCH) 100 UNIT/ML Pen Inject 18 Units into the skin daily. Patient taking differently: Inject 18 Units into the skin at bedtime.  06/22/18  Yes Love, Ivan Anchors, PA-C  ipratropium-albuterol (DUONEB) 0.5-2.5 (3) MG/3ML SOLN Use nebulizer to inhale contents of one vial every 6 hours as needed for shortness of breath, wheezing 06/22/18  Yes Love, Ivan Anchors, PA-C  lactulose (CHRONULAC) 10 GM/15ML solution Take 45 mLs (30 g total) by mouth 2 (two) times daily. 06/22/18  Yes Love, Ivan Anchors, PA-C  nitroGLYCERIN (NITROSTAT) 0.4 MG SL tablet Place 1 tablet (0.4 mg total) under the tongue every 5 (five) minutes as needed for chest pain. 01/27/18  Yes Fulp, Cammie, MD  omeprazole (PRILOSEC) 40 MG capsule Take 1 capsule (40 mg total) by mouth daily. 06/22/18  Yes Love, Ivan Anchors, PA-C  rosuvastatin (CRESTOR) 40 MG tablet Take 1 tablet (40 mg total) by mouth daily at 6 PM. 06/22/18  Yes Love, Ivan Anchors, PA-C  NEEDLE, DISP, 26 G 26G X 1/2" MISC 1 application by Does not apply route daily. 06/22/18   Bary Leriche, PA-C    Physical Exam: Vitals:   06/24/18 2230 06/24/18 2235 06/24/18 2245 06/24/18 2300  BP:  131/74 124/70 130/73  Pulse: 77 75 74 77  Resp: (!) 26 (!) 22 (!) 26 (!) 23  Temp:      TempSrc:      SpO2: 100% 100% 100% 100%  Weight:      Height:        Constitutional: NAD, calm, comfortable Eyes: PERRL, lids and conjunctivae normal ENMT: Mucous membranes are moist. Posterior pharynx clear of any exudate or lesions.Normal dentition.  Neck: normal, supple, no masses, no thyromegaly Respiratory: clear to auscultation bilaterally, no wheezing, no crackles. Normal respiratory  effort. No accessory muscle use.  Cardiovascular: Regular rate and rhythm, no murmurs / rubs / gallops. No extremity edema. 2+ pedal pulses. No carotid bruits.  Abdomen: no tenderness, no masses palpated. No hepatosplenomegaly. Bowel sounds positive.  Musculoskeletal: no clubbing / cyanosis. No joint deformity upper and lower extremities. Good ROM, no contractures. Normal muscle tone.  Skin: no rashes, lesions, ulcers. No induration Neurologic: CN 2-12 grossly intact. Sensation intact, DTR normal. Strength 5/5 in all 4.  Psychiatric: Normal judgment and insight. Alert and oriented x 3. Normal mood.    Labs on Admission: I have personally reviewed following labs and imaging studies  CBC: Recent Labs  Lab  06/19/18 0653 06/20/18 1134 06/24/18 2039  WBC 8.0 8.5 7.7  NEUTROABS  --   --  5.2  HGB 7.1* 7.3* 6.9*  HCT 26.9* 28.4* 26.1*  MCV 86.8 87.4 90.0  PLT 214 235 191   Basic Metabolic Panel: Recent Labs  Lab 06/19/18 0653 06/21/18 0611 06/24/18 2039  NA 137 138 136  K 3.7 3.5 3.5  CL 89* 92* 94*  CO2 41* 38* 38*  GLUCOSE 168* 105* 224*  BUN 14 13 12   CREATININE 0.93 0.97 1.04*  CALCIUM 8.8* 8.7* 8.6*  MG  --   --  1.7   GFR: Estimated Creatinine Clearance: 79.1 mL/min (A) (by C-G formula based on SCr of 1.04 mg/dL (H)). Liver Function Tests: Recent Labs  Lab 06/24/18 2039  AST 26  ALT 15  ALKPHOS 111  BILITOT 1.2  PROT 8.6*  ALBUMIN 2.4*   No results for input(s): LIPASE, AMYLASE in the last 168 hours. Recent Labs  Lab 06/19/18 0653  AMMONIA 71*   Coagulation Profile: No results for input(s): INR, PROTIME in the last 168 hours. Cardiac Enzymes: No results for input(s): CKTOTAL, CKMB, CKMBINDEX, TROPONINI in the last 168 hours. BNP (last 3 results) No results for input(s): PROBNP in the last 8760 hours. HbA1C: No results for input(s): HGBA1C in the last 72 hours. CBG: Recent Labs  Lab 06/21/18 1148 06/21/18 1658 06/21/18 2135 06/22/18 0643  06/22/18 1116  GLUCAP 186* 192* 198* 198* 203*   Lipid Profile: No results for input(s): CHOL, HDL, LDLCALC, TRIG, CHOLHDL, LDLDIRECT in the last 72 hours. Thyroid Function Tests: No results for input(s): TSH, T4TOTAL, FREET4, T3FREE, THYROIDAB in the last 72 hours. Anemia Panel: No results for input(s): VITAMINB12, FOLATE, FERRITIN, TIBC, IRON, RETICCTPCT in the last 72 hours. Urine analysis:    Component Value Date/Time   COLORURINE YELLOW 06/24/2018 1937   APPEARANCEUR HAZY (A) 06/24/2018 1937   LABSPEC 1.017 06/24/2018 1937   PHURINE 7.0 06/24/2018 1937   GLUCOSEU NEGATIVE 06/24/2018 1937   HGBUR NEGATIVE 06/24/2018 1937   BILIRUBINUR NEGATIVE 06/24/2018 1937   KETONESUR NEGATIVE 06/24/2018 1937   PROTEINUR 100 (A) 06/24/2018 1937   UROBILINOGEN 2.0 (H) 02/21/2015 1914   NITRITE NEGATIVE 06/24/2018 1937   LEUKOCYTESUR NEGATIVE 06/24/2018 1937    Radiological Exams on Admission: Dg Chest 2 View  Result Date: 06/24/2018 CLINICAL DATA:  Shortness of breath. EXAM: CHEST - 2 VIEW COMPARISON:  06/12/2018. FINDINGS: Stable enlarged cardiac silhouette and prominent interstitial markings. Small pleural effusions. Stable scarring in the left lateral chest. Unremarkable bones. IMPRESSION: Stable cardiomegaly and mild changes of congestive heart failure. Electronically Signed   By: Claudie Revering M.D.   On: 06/24/2018 20:16    EKG: Independently reviewed.  Assessment/Plan Principal Problem:   Acute on chronic combined systolic (congestive) and diastolic (congestive) heart failure (HCC) Active Problems:   Essential hypertension   Acute and chr resp failure, unsp w hypoxia or hypercapnia (HCC)   Obesity hypoventilation syndrome (HCC)   Symptomatic anemia   Cerebral embolism with cerebral infarction   Diabetes mellitus type 2 in obese ([place])    1. Acute on chronic combined CHF - mild exacerbation likely due to anemia 1. EF 40-45% 2. CHF pathway 3. Lasix 40mg  IV x1  tonight 4. Strict intake and output 5. Repeat BMP in AM 6. Transfuse 1u PRBC as below 2. Symptomatic anemia -  1. 1u PRBC transfusion 2. Slowly progressive, likely iron deficiency vs anemia of chronic disease 3. Repeat CBC in AM 3.  HVOS - 1. BIPAP QHS 2. CM consult for home DME 4. Stroke - 1. Continue ASA 81 5. HTN - continue home BP meds 6. DM2 -  1. Levemir 10 QHS 2. Mod scale SSI AC  DVT prophylaxis: Lovenox Code Status: Full Family Communication: no family in room Disposition Plan: Home after admit Consults called: None Admission status: Place in obs   [place], [place] [facility] Pager [phone] Only works nights!  If 7AM-7PM, please contact the primary day team physician taking care of patient  [email or web address] Password [email or web address]  06/25/2018, 1:12 AM

## 2018-06-25 NOTE — Progress Notes (Signed)
Went to check on patient.  Walked into room and patient was repeating "oh lord, oh lord, oh lord.."    Assessed Neuro status- Pt was slow to respond but did eventually answer all question appropriately; head was twitching;   PERRLA - WNL, grips same as previous assessment.   Unsure when started but episode lasted maybe about 1-2 minutes while I was there.    Concern for poss. Seizure- called another nurse for secondary assessment.

## 2018-06-25 NOTE — Progress Notes (Signed)
Pt requested BiPap be placed back on.  Called RT

## 2018-06-25 NOTE — Progress Notes (Signed)
   06/25/18 1500  Clinical Encounter Type  Visited With Patient and family together  Visit Type Spiritual support  Referral From Nurse  Visit with patient and daughter was at bedside. Patient just wanted to talk to [place]. Patient said she is a little depressed because of all her sickness. She desire to get well and requested prayer. I also introduced the [place] and she said that she want her daughter to make those health decisions if she could not. Daughter agreed and said she would if that happened. The papers were filled out and just need notary on Monday. PLEASE PUT IN ANOTHER CONSULT FOR AD ON Monday!

## 2018-06-25 NOTE — Progress Notes (Signed)
Patient transferred to [place] from ED. Patient alert and oriented. Admission assessment complete. MASD noted under breast. Patient updated on plan of care. No other complaints a this time. Will continue to monitor patient.

## 2018-06-25 NOTE — Progress Notes (Signed)
Assigned RN expressed concern about patient's repetitive speech and twitching. Upon entering pt room; sx had subsided. Neuro intact, pt denies loss of time. Pt showed no signs of postictal state.  Pt reports hx of tremors/twitching, pt reports praying "oh lord, oh lord, oh lord" intentionally, that it was not an uncontrollable stutter.   Pt reports hx of seizures, but nothing anyone has ever treated her for.   Pt denies any recent presentation as different from her baseline, and does not believe she had any type of neurological change.   Negative NIHSS, Neuro intact.

## 2018-06-25 NOTE — Progress Notes (Signed)
Patient reports difficulty obtaining a deep breath. Pt positioned slouched in the bed upon complaining. Pt's oxygen saturation reads 100% she was educated on this measurement and indication that O2 supplement being sufficient.   Pt agreed to reposition in bed, to facilitate better breathing. Pt repositioned and reports improvement.

## 2018-06-25 NOTE — Care Management Note (Signed)
Case Management Note  Patient Details  Name: Tamara Silva MRN: [id number] Date of Birth: 05/28/1961  Subjective/Objective:                    Action/Plan:  Consulted by Dr Cruzita Lederer for BiPAP set up.Per MD patient was DC'd from CIR 1/23 w/o BiPAP set up and broken CPAP at home and returned to hospital 1/25.  Orders obtained and referral started w [facility]. NIV anticipated to be delivered to hospital room on 1/27.   Expected Discharge Date:  06/27/18               Expected Discharge Plan:     In-House Referral:     Discharge planning Services  CM Consult  Post Acute Care Choice:  Durable Medical Equipment Choice offered to:     DME Arranged:    DME Agency:     HH Arranged:    HH Agency:     Status of Service:  In process, will continue to follow  If discussed at Long Length of Stay Meetings, dates discussed:    Additional Comments:  Carles Collet, RN 06/25/2018, 1:00 PM

## 2018-06-25 NOTE — Progress Notes (Signed)
PIV consult cancelled due to ED RN obtained PIV

## 2018-06-25 NOTE — ED Notes (Signed)
Report given to 3E RN. All questions answered 

## 2018-06-25 NOTE — Progress Notes (Signed)
Patient seen and examined this morning, admitted overnight by Dr. Alcario Drought.  H&P reviewed, agree with the assessment and plan  58 year old female with history of OSA, COPD, chronic hypoxic respiratory failure(4 L at home), iron deficiency anemia. Patient was admitted to hospital from 12/30 to 1/10 with acute anemia with hgb 5.7, a right MCA/PCA watershed area stroke and a 2 cm infarct in the inferior right parietal lobe (felt to be incidental), with hypercapnic respiratory failure. Had tonic-clonic seizure and was intubated.  Ultimately extubated and discharged on 1/10 with HGB 8.x (3u PRBC transfusion) and nightly BIPAP. She then spent from 1/10 to 1/23 in the rehab.  HGB has slowly trended down during that time and was 7.3 on 1/21. Patient was discharged home x2 days ago on 1/23; however, unfortunately patient (who is now BIPAP dependent at night) does not have a BIPAP at home. She had been trying to increase O2 at night to 6L via [place], but remains SOB, and returns to the ED today.  Principal problem: Acute on chronic hypoxic and hypercarbic respiratory failure -Likely worsening in the setting of not having home BiPAP.  She has pretty significant sleep apnea as well as chronic hypoxia due to her COPD, most recent ABGs reviewed were done just a week ago showed PCO2's in the 70-100 range consistently -Consulted care management, appreciate input, patient not safe for discharge on their home equipment physically present in her home and set up  Active problems: Acute on chronic combined systolic and diastolic CHF -Patient appears slightly fluid overloaded with lower extremity edema, crackles on exam as well as evidence of fluid overload on chest x-ray.   -2D echo done on May 31, 2018 showed an EF of 40-45%, as well as grade 2 diastolic dysfunction -She received IV Lasix in the ED, renal function is stable and I will schedule IV Lasix -Continue Coreg, will likely need ARB added if blood pressure  tolerates  Acute on chronic iron deficiency anemia -Chronic, hemoglobin in the 7-8 range for past year, no bleeding, she was transfused a unit of packed red blood cells on admission for hemoglobin of 6.9, increased to 7.4 this morning.  Will transfuse 1 additional unit. -Anemia panel revealed iron deficiency on 05/29/2018. Folate and B12 were okay -Start iron supplementation  Insulin-dependent diabetes mellitus -Patient currently on detemir as well as sliding scale, continue, CBGs stable  Recent CVA -Consult PT, she was just in inpatient rehab for few weeks.  Continue aspirin.  Hyperlipidemia -Continue statin  Hypertension -Blood pressure stable, continue Coreg    Costin M. Cruzita Lederer, MD, PhD [facility]  Contact via  [email or web address]  [place] P: [phone]  F: [phone]

## 2018-06-26 ENCOUNTER — Observation Stay (HOSPITAL_COMMUNITY): Payer: Medicaid Other

## 2018-06-26 DIAGNOSIS — Z955 Presence of coronary angioplasty implant and graft: Secondary | ICD-10-CM | POA: Diagnosis not present

## 2018-06-26 DIAGNOSIS — I110 Hypertensive heart disease with heart failure: Secondary | ICD-10-CM | POA: Diagnosis not present

## 2018-06-26 DIAGNOSIS — Z86711 Personal history of pulmonary embolism: Secondary | ICD-10-CM | POA: Diagnosis not present

## 2018-06-26 DIAGNOSIS — Z8249 Family history of ischemic heart disease and other diseases of the circulatory system: Secondary | ICD-10-CM | POA: Diagnosis not present

## 2018-06-26 DIAGNOSIS — R0602 Shortness of breath: Secondary | ICD-10-CM | POA: Diagnosis not present

## 2018-06-26 DIAGNOSIS — Z79899 Other long term (current) drug therapy: Secondary | ICD-10-CM | POA: Diagnosis not present

## 2018-06-26 DIAGNOSIS — J9622 Acute and chronic respiratory failure with hypercapnia: Secondary | ICD-10-CM | POA: Diagnosis present

## 2018-06-26 DIAGNOSIS — D509 Iron deficiency anemia, unspecified: Secondary | ICD-10-CM | POA: Diagnosis present

## 2018-06-26 DIAGNOSIS — J9620 Acute and chronic respiratory failure, unspecified whether with hypoxia or hypercapnia: Secondary | ICD-10-CM

## 2018-06-26 DIAGNOSIS — Z87891 Personal history of nicotine dependence: Secondary | ICD-10-CM | POA: Diagnosis not present

## 2018-06-26 DIAGNOSIS — Z8673 Personal history of transient ischemic attack (TIA), and cerebral infarction without residual deficits: Secondary | ICD-10-CM | POA: Diagnosis not present

## 2018-06-26 DIAGNOSIS — Z7982 Long term (current) use of aspirin: Secondary | ICD-10-CM | POA: Diagnosis not present

## 2018-06-26 DIAGNOSIS — I10 Essential (primary) hypertension: Secondary | ICD-10-CM | POA: Diagnosis not present

## 2018-06-26 DIAGNOSIS — J9621 Acute and chronic respiratory failure with hypoxia: Secondary | ICD-10-CM | POA: Diagnosis present

## 2018-06-26 DIAGNOSIS — Z9981 Dependence on supplemental oxygen: Secondary | ICD-10-CM | POA: Diagnosis not present

## 2018-06-26 DIAGNOSIS — I5043 Acute on chronic combined systolic (congestive) and diastolic (congestive) heart failure: Secondary | ICD-10-CM | POA: Diagnosis present

## 2018-06-26 DIAGNOSIS — I2510 Atherosclerotic heart disease of native coronary artery without angina pectoris: Secondary | ICD-10-CM | POA: Diagnosis present

## 2018-06-26 DIAGNOSIS — E876 Hypokalemia: Secondary | ICD-10-CM | POA: Diagnosis present

## 2018-06-26 DIAGNOSIS — Z833 Family history of diabetes mellitus: Secondary | ICD-10-CM | POA: Diagnosis not present

## 2018-06-26 DIAGNOSIS — E119 Type 2 diabetes mellitus without complications: Secondary | ICD-10-CM | POA: Diagnosis present

## 2018-06-26 DIAGNOSIS — J449 Chronic obstructive pulmonary disease, unspecified: Secondary | ICD-10-CM | POA: Diagnosis present

## 2018-06-26 DIAGNOSIS — Z794 Long term (current) use of insulin: Secondary | ICD-10-CM | POA: Diagnosis not present

## 2018-06-26 DIAGNOSIS — Z6841 Body Mass Index (BMI) 40.0 and over, adult: Secondary | ICD-10-CM | POA: Diagnosis not present

## 2018-06-26 DIAGNOSIS — E662 Morbid (severe) obesity with alveolar hypoventilation: Secondary | ICD-10-CM | POA: Diagnosis present

## 2018-06-26 DIAGNOSIS — T501X5A Adverse effect of loop [high-ceiling] diuretics, initial encounter: Secondary | ICD-10-CM | POA: Diagnosis present

## 2018-06-26 DIAGNOSIS — E785 Hyperlipidemia, unspecified: Secondary | ICD-10-CM | POA: Diagnosis present

## 2018-06-26 DIAGNOSIS — G4089 Other seizures: Secondary | ICD-10-CM | POA: Diagnosis present

## 2018-06-26 DIAGNOSIS — E1169 Type 2 diabetes mellitus with other specified complication: Secondary | ICD-10-CM | POA: Diagnosis not present

## 2018-06-26 LAB — BASIC METABOLIC PANEL
Anion gap: 10 (ref 5–15)
BUN: 10 mg/dL (ref 6–20)
CO2: 36 mmol/L — ABNORMAL HIGH (ref 22–32)
Calcium: 8.5 mg/dL — ABNORMAL LOW (ref 8.9–10.3)
Chloride: 90 mmol/L — ABNORMAL LOW (ref 98–111)
Creatinine, Ser: 1.1 mg/dL — ABNORMAL HIGH (ref 0.44–1.00)
GFR calc non Af Amer: 55 mL/min — ABNORMAL LOW (ref >60)
Glucose, Bld: 116 mg/dL — ABNORMAL HIGH (ref 70–99)
Potassium: 3.1 mmol/L — ABNORMAL LOW (ref 3.5–5.1)
Sodium: 136 mmol/L (ref 135–145)

## 2018-06-26 LAB — GLUCOSE, CAPILLARY
Glucose-Capillary: 104 mg/dL — ABNORMAL HIGH (ref 70–99)
Glucose-Capillary: 170 mg/dL — ABNORMAL HIGH (ref 70–99)
Glucose-Capillary: 154 mg/dL — ABNORMAL HIGH (ref 70–99)
Glucose-Capillary: 133 mg/dL — ABNORMAL HIGH (ref 70–99)

## 2018-06-26 LAB — BPAM RBC
Blood Product Expiration Date: 202002192359
Blood Product Expiration Date: 202002192359
ISSUE DATE / TIME: 202001260240
ISSUE DATE / TIME: 202001261210
Unit Type and Rh: 7300
Unit Type and Rh: 7300

## 2018-06-26 LAB — CBC WITH DIFFERENTIAL/PLATELET
Abs Immature Granulocytes: 0.03 10*3/uL (ref 0.00–0.07)
Basophils Absolute: 0 10*3/uL (ref 0.0–0.1)
Basophils Relative: 1 %
Eosinophils Absolute: 0.2 10*3/uL (ref 0.0–0.5)
Eosinophils Relative: 2 %
HCT: 26.1 % — ABNORMAL LOW (ref 36.0–46.0)
HEMOGLOBIN: 6.9 g/dL — AB (ref 12.0–15.0)
Immature Granulocytes: 0 %
Lymphocytes Relative: 18 %
Lymphs Abs: 1.4 10*3/uL (ref 0.7–4.0)
MCH: 23.8 pg — ABNORMAL LOW (ref 26.0–34.0)
MCHC: 26.4 g/dL — ABNORMAL LOW (ref 30.0–36.0)
MCV: 90 fL (ref 80.0–100.0)
Monocytes Absolute: 0.9 10*3/uL (ref 0.1–1.0)
Monocytes Relative: 12 %
NRBC: 0.8 % — AB (ref 0.0–0.2)
Neutro Abs: 5.2 10*3/uL (ref 1.7–7.7)
Neutrophils Relative %: 67 %
Platelets: 264 10*3/uL (ref 150–400)
RBC: 2.9 MIL/uL — ABNORMAL LOW (ref 3.87–5.11)
WBC: 7.7 10*3/uL (ref 4.0–10.5)

## 2018-06-26 LAB — URINE CULTURE

## 2018-06-26 LAB — TYPE AND SCREEN
ABO/RH(D): B POS
Antibody Screen: NEGATIVE
UNIT DIVISION: 0
Unit division: 0

## 2018-06-26 LAB — CBC
HCT: 30.7 % — ABNORMAL LOW (ref 36.0–46.0)
Hemoglobin: 8.3 g/dL — ABNORMAL LOW (ref 12.0–15.0)
MCH: 23.9 pg — ABNORMAL LOW (ref 26.0–34.0)
MCHC: 27 g/dL — ABNORMAL LOW (ref 30.0–36.0)
MCV: 88.5 fL (ref 80.0–100.0)
Platelets: 238 10*3/uL (ref 150–400)
RBC: 3.47 MIL/uL — ABNORMAL LOW (ref 3.87–5.11)
RDW: 30.6 % — ABNORMAL HIGH (ref 11.5–15.5)
WBC: 6.8 10*3/uL (ref 4.0–10.5)
nRBC: 0.6 % — ABNORMAL HIGH (ref 0.0–0.2)

## 2018-06-26 MED ORDER — IPRATROPIUM-ALBUTEROL 0.5-2.5 (3) MG/3ML IN SOLN
3.0000 mL | Freq: Four times a day (QID) | RESPIRATORY_TRACT | Status: DC
  Administered 2018-06-26 (×2): 3 mL via RESPIRATORY_TRACT
  Filled 2018-06-26 (×2): qty 3

## 2018-06-26 MED ORDER — POTASSIUM CHLORIDE CRYS ER 20 MEQ PO TBCR
40.0000 meq | EXTENDED_RELEASE_TABLET | Freq: Once | ORAL | Status: AC
  Administered 2018-06-26: 40 meq via ORAL
  Filled 2018-06-26: qty 2

## 2018-06-26 MED ORDER — FUROSEMIDE 40 MG PO TABS: 40.0000 mg | ORAL_TABLET | Freq: Every day | ORAL | Status: DC

## 2018-06-26 MED ORDER — FUROSEMIDE 10 MG/ML IJ SOLN
40.0000 mg | Freq: Two times a day (BID) | INTRAMUSCULAR | Status: AC
  Administered 2018-06-26 (×2): 40 mg via INTRAVENOUS
  Filled 2018-06-26: qty 4

## 2018-06-26 MED ORDER — FUROSEMIDE 40 MG PO TABS
40.0000 mg | ORAL_TABLET | Freq: Every day | ORAL | Status: DC
  Administered 2018-06-27: 40 mg via ORAL
  Filled 2018-06-26: qty 1

## 2018-06-26 NOTE — Progress Notes (Signed)
PROGRESS NOTE  Tamara Silva GYI:[id number] DOB: [birth year] DOA: 06/24/2018 PCP: Antony Blackbird, MD   LOS: 0 days   Brief Narrative / Interim history: 58 year old female with history of OSA, COPD, chronic hypoxic respiratory failure(4 L at home), iron deficiency anemia. Patient was admitted to hospital from 12/30 to 1/10 withacute anemia with hgb 5.7, a right MCA/PCA watershed area stroke and a 2 cm infarct in the inferior right parietal lobe (felt to be incidental), with hypercapnic respiratory failure. Had tonic-clonic seizure and was intubated. Ultimately extubated and discharged on 1/10 with HGB 8.x (3u PRBC transfusion) and nightly BIPAP. She then spent from 1/10 to 1/23 in the rehab. HGB has slowly trended down during that time and was 7.3 on 1/21. Patient was discharged home x2 days ago on 1/23; however, unfortunately patient (who is now BIPAP dependent at night) does not have a BIPAP at home. She had been trying to increase O2at night to 6Lvia [place], but remained SOB, and returned to the ED on 1/26  Subjective: -Continues to complain of shortness of breath, states that she has been having an increased cough with more mucus production  Assessment & Plan: Principal Problem:   Acute on chronic combined systolic (congestive) and diastolic (congestive) heart failure (HCC) Active Problems:   Essential hypertension   Acute and chr resp failure, unsp w hypoxia or hypercapnia (HCC)   Obesity hypoventilation syndrome (HCC)   Symptomatic anemia   Cerebral embolism with cerebral infarction   Diabetes mellitus type 2 in obese [facility])  Principal problem: Acute on chronic hypoxic and hypercarbic respiratory failure -Likely worsening in the setting of not having home BiPAP.  She has pretty significant sleep apnea as well as chronic hypoxia due to her COPD, most recent ABGs reviewed were done just a week ago showed PCO2's in the 70-100 range consistently -Consulted care management, appreciate input,  she qualifies for NIV and delivery is in process -Lungs sound a little bit worse today, she has been maintained on IV diuresis, continue.  She is coughing more and bringing up more sputum.  We will repeat the chest x-ray  Active problems: Acute on chronic combined systolic and diastolic CHF -Patient remains with evidence of fluid overload, continue IV diuresis for today and reevaluate.  Repeat chest x-ray  -2D echo done on May 31, 2018 showed an EF of 40-45%, as well as grade 2 diastolic dysfunction -She received IV Lasix in the ED, renal function is stable and I will schedule IV Lasix -Continue Coreg, will likely need ARB added if blood pressure tolerates and if renal function remains stable  Acute on chronic iron deficiency anemia -Chronic, hemoglobin in the 7-8 range for past year, no bleeding, she was transfused a unit of packed red blood cells on admission for hemoglobin of 6.9, she was transfused a total of 2 units of packed red blood cells -Anemia panel revealed iron deficiency on 05/29/2018. Folate and B12 were okay -Start iron supplementation -Hemoglobin this morning stable at 8.3.  No evidence of bleed  Insulin-dependent diabetes mellitus -Patient currently on detemir as well as sliding scale, continue, CBGs stable, fasting this morning 104, keep on current regimen  Recent CVA -Consult PT, she was just in inpatient rehab for few weeks.  Continue aspirin.  Hyperlipidemia -Continue statin  Hypertension -Blood pressure stable, continue Coreg  Hypokalemia -Due to Lasix, replete and recheck tomorrow morning  Scheduled Meds: . sodium chloride   Intravenous Once  . aspirin  81 mg Oral Daily  . carvedilol  3.125 mg Oral BID WC  . enoxaparin (LOVENOX) injection  40 mg Subcutaneous Q24H  . ferrous gluconate  324 mg Oral TID WC  . furosemide  40 mg Intravenous Q12H  . [START ON 06/27/2018] furosemide  40 mg Oral Daily  . insulin aspart  0-15 Units Subcutaneous TID WC  .  insulin detemir  10 Units Subcutaneous QHS  . ipratropium-albuterol  3 mL Nebulization Q6H  . lactulose  30 g Oral BID  . nystatin   Topical BID  . pantoprazole  80 mg Oral Daily  . rosuvastatin  40 mg Oral q1800  . sodium chloride flush  3 mL Intravenous Q12H   Continuous Infusions: . sodium chloride     PRN Meds:.sodium chloride, acetaminophen, ondansetron (ZOFRAN) IV, sodium chloride flush  DVT prophylaxis: Lovenox Code Status: Full code Family Communication: No family present at bedside Disposition Plan: Home when ready  Consultants:   None  Procedures:   None   Antimicrobials:  None    Objective: Vitals:   06/25/18 1923 06/26/18 0400 06/26/18 0802 06/26/18 0825  BP: 130/83 125/65  (!) 120/51  Pulse: 81 65  69  Resp: 18 18    Temp: 99.8 F (37.7 C) (!) 97.5 F (36.4 C)    TempSrc: Oral Oral    SpO2: 100% 99% 98%   Weight:  (!) 137.2 kg    Height:        Intake/Output Summary (Last 24 hours) at 06/26/2018 0940 Last data filed at 06/26/2018 0925 Gross per 24 hour  Intake 1102 ml  Output 2750 ml  Net -1648 ml   Filed Weights   06/24/18 1935 06/25/18 0158 06/26/18 0400  Weight: 120 kg (!) 137.9 kg (!) 137.2 kg    Examination:  Constitutional: No apparent distress, sitting up in bed Eyes: PERRL, lids and conjunctivae normal ENMT: Mucous membranes are moist.  Neck: normal, supple Respiratory: Coarse breath sounds bilaterally, end expiratory wheezing, crackles at the bases.  Increased respiratory effort Cardiovascular: Regular rate and rhythm, no murmurs / rubs / gallops.  1+ LE edema. 2+ pedal pulses. No carotid bruits.  Abdomen: no tenderness. Bowel sounds positive.  Musculoskeletal: no clubbing / cyanosis Skin: no rashes, lesions, ulcers. No induration Neurologic: CN 2-12 grossly intact. Strength 5/5 in all 4.  Psychiatric: Normal judgment and insight. Alert and oriented x 3. Normal mood.    Data Reviewed: I have independently reviewed following  labs and imaging studies   CBC: Recent Labs  Lab 06/20/18 1134 06/24/18 2039 06/25/18 0733 06/25/18 2045 06/26/18 0416  WBC 8.5 7.7 6.7  --  6.8  NEUTROABS  --  5.2  --   --   --   HGB 7.3* 6.9* 7.4* 8.3* 8.3*  HCT 28.4* 26.1* 26.9* 29.4* 30.7*  MCV 87.4 90.0 87.1  --  88.5  PLT 235 264 245  --  277   Basic Metabolic Panel: Recent Labs  Lab 06/21/18 0611 06/24/18 2039 06/25/18 0733 06/26/18 0416  NA 138 136 137 136  K 3.5 3.5 3.6 3.1*  CL 92* 94* 93* 90*  CO2 38* 38* 35* 36*  GLUCOSE 105* 224* 175* 116*  BUN 13 12 9 10   CREATININE 0.97 1.04* 1.04* 1.10*  CALCIUM 8.7* 8.6* 8.6* 8.5*  MG  --  1.7  --   --    GFR: Estimated Creatinine Clearance: 80.8 mL/min (A) (by C-G formula based on SCr of 1.1 mg/dL (H)). Liver Function Tests: Recent Labs  Lab 06/24/18 2039  AST  26  ALT 15  ALKPHOS 111  BILITOT 1.2  PROT 8.6*  ALBUMIN 2.4*   No results for input(s): LIPASE, AMYLASE in the last 168 hours. No results for input(s): AMMONIA in the last 168 hours. Coagulation Profile: No results for input(s): INR, PROTIME in the last 168 hours. Cardiac Enzymes: No results for input(s): CKTOTAL, CKMB, CKMBINDEX, TROPONINI in the last 168 hours. BNP (last 3 results) No results for input(s): PROBNP in the last 8760 hours. HbA1C: No results for input(s): HGBA1C in the last 72 hours. CBG: Recent Labs  Lab 06/25/18 1225 06/25/18 1652 06/25/18 2045 06/25/18 2111 06/26/18 0732  GLUCAP 154* 232* 193* 189* 104*   Lipid Profile: No results for input(s): CHOL, HDL, LDLCALC, TRIG, CHOLHDL, LDLDIRECT in the last 72 hours. Thyroid Function Tests: No results for input(s): TSH, T4TOTAL, FREET4, T3FREE, THYROIDAB in the last 72 hours. Anemia Panel: No results for input(s): VITAMINB12, FOLATE, FERRITIN, TIBC, IRON, RETICCTPCT in the last 72 hours. Urine analysis:    Component Value Date/Time   COLORURINE YELLOW 06/24/2018 1937   APPEARANCEUR HAZY (A) 06/24/2018 1937   LABSPEC  1.017 06/24/2018 1937   PHURINE 7.0 06/24/2018 1937   GLUCOSEU NEGATIVE 06/24/2018 1937   HGBUR NEGATIVE 06/24/2018 1937   BILIRUBINUR NEGATIVE 06/24/2018 1937   KETONESUR NEGATIVE 06/24/2018 1937   PROTEINUR 100 (A) 06/24/2018 1937   UROBILINOGEN 2.0 (H) 02/21/2015 1914   NITRITE NEGATIVE 06/24/2018 1937   LEUKOCYTESUR NEGATIVE 06/24/2018 1937   Sepsis Labs: Invalid input(s): PROCALCITONIN, LACTICIDVEN  Recent Results (from the past 240 hour(s))  Urine culture     Status: Abnormal   Collection Time: 06/24/18  7:45 PM  Result Value Ref Range Status   Specimen Description URINE, CLEAN CATCH  Final   Special Requests   Final    NONE Performed at [facility], [address], [place], [place] [address]    Culture MULTIPLE SPECIES PRESENT, SUGGEST RECOLLECTION (A)  Final   Report Status 06/26/2018 FINAL  Final      Radiology Studies: Dg Chest 2 View  Result Date: 06/24/2018 CLINICAL DATA:  Shortness of breath. EXAM: CHEST - 2 VIEW COMPARISON:  06/12/2018. FINDINGS: Stable enlarged cardiac silhouette and prominent interstitial markings. Small pleural effusions. Stable scarring in the left lateral chest. Unremarkable bones. IMPRESSION: Stable cardiomegaly and mild changes of congestive heart failure. Electronically Signed   By: Claudie Revering M.D.   On: 06/24/2018 20:16    Marzetta Board, MD, PhD [facility]  Contact via  [email or web address]  [place] P: [phone]  F: [phone]

## 2018-06-26 NOTE — Progress Notes (Signed)
Patient states she just came off her BIPAP.  She will call when ready to be put back on. Currently on 4 lpm [place]

## 2018-06-26 NOTE — Progress Notes (Signed)
Patient called for BIPAP.  Per RN patient has just finished eating. Patient made aware she will need to wait 45-min to an hour before placing BIPAP for HS.  No distress noted at this time. RN will place BIPAP.

## 2018-06-26 NOTE — Evaluation (Signed)
Physical Therapy Evaluation Patient Details Name: Tamara Silva MRN: [id number] DOB: Feb 26, 1961 Today's Date: 06/26/2018   History of Present Illness  PAtient is a 58 y/o female who presents with SOB. Admitted with Acute on chronic hypoxic and hypercarbic respiratory failure and Acute on chronic combined systolic and diastolic CHF. Recent admission to CIR from 1/10-1/23. PMH includes OSA, CVA, seizures, asthma, HF, DM, HTN, anemia.  Clinical Impression  Patient presents with generalized weakness, impaired balance, cognitive deficits and impaired mobility s/p above. Pt requires max encouragement/coaxing to participate in mobility. Tolerated standing and taking a few steps along side bed with min A-Min guard assist for balance/safety. Declined any further mobility. Pt recently d/ced from CIR on 1/23 walking 64' with RW and having assist with ADLs from boyfriend. Reports she almost fell twice at home the 2 days she was there. Will follow acutely to maximize independence and mobility prior to return home. Recommend continuing HHPT and maximizing HH services at d/c.    Follow Up Recommendations Home health PT;Supervision for mobility/OOB    Equipment Recommendations  None recommended by PT    Recommendations for Other Services       Precautions / Restrictions Precautions Precautions: Fall Precaution Comments: 4L 02 at baseline Restrictions Weight Bearing Restrictions: No      Mobility  Bed Mobility Overal bed mobility: Needs Assistance Bed Mobility: Supine to Sit     Supine to sit: Mod assist;Min assist;HOB elevated     General bed mobility comments: Mod A to bring trunk forward to long sitting per request; finally agreeable to get to EOB with min A. Increased time/coaxing.  Transfers Overall transfer level: Needs assistance Equipment used: Rolling walker (2 wheeled) Transfers: Sit to/from Stand Sit to Stand: Min assist;Min guard         General transfer comment: min A to  steady in standing, increased time/effort to stand from EOB with use of momentum. Declined getting into chair.  Ambulation/Gait Ambulation/Gait assistance: Min guard Gait Distance (Feet): 4 Feet Assistive device: Rolling walker (2 wheeled)   Gait velocity: decreased   General Gait Details: Able to side step along side bed with Min guard assist and RW for support; slow to step. Sp02 >91% on 4L/min 02. productive cough.  Stairs            Wheelchair Mobility    Modified Rankin (Stroke Patients Only)       Balance Overall balance assessment: Needs assistance Sitting-balance support: Feet supported;No upper extremity supported Sitting balance-Leahy Scale: Fair Sitting balance - Comments: able to maintain static sitting with min guard assist.    Standing balance support: During functional activity;Bilateral upper extremity supported Standing balance-Leahy Scale: Poor Standing balance comment: Reliant on BUE support in standing.                             Pertinent Vitals/Pain Pain Assessment: No/denies pain    Home Living Family/patient expects to be discharged to:: Private residence Living Arrangements: Spouse/significant other Available Help at Discharge: Family;Available 24 hours/day Type of Home: House Home Access: Stairs to enter Entrance Stairs-Rails: None Entrance Stairs-Number of Steps: 2 Home Layout: One level Home Equipment: Cane - single point;Bedside commode;Walker - 2 wheels      Prior Function Level of Independence: Needs assistance   Gait / Transfers Assistance Needed: ambulates short distances with RW. ~30' at [place] / Homemaking Assistance Needed: Assist with ADLs at baseline per chart.   Comments:  pt reports she almost fell while at home for a few days prior to readmission. Reports she is not sure her bf can help care for her but does not state any other options/plans. "the lord will help me" when asked about it.     Hand  Dominance   Dominant Hand: Right    Extremity/Trunk Assessment   Upper Extremity Assessment Upper Extremity Assessment: Defer to OT evaluation    Lower Extremity Assessment Lower Extremity Assessment: Generalized weakness(Not willing to participate in MMT, able to lift BLEs against gravity and stand without knee buckling)       Communication   Communication: No difficulties  Cognition Arousal/Alertness: Awake/alert Behavior During Therapy: WFL for tasks assessed/performed Overall Cognitive Status: Impaired/Different from baseline Area of Impairment: Attention;Memory;Following commands;Safety/judgement;Awareness;Problem solving                   Current Attention Level: Selective Memory: Decreased short-term memory Following Commands: Follows one step commands with increased time Safety/Judgement: Decreased awareness of safety;Decreased awareness of deficits Awareness: Intellectual;Emergent Problem Solving: Slow processing;Decreased initiation;Difficulty sequencing;Requires verbal cues;Requires tactile cues General Comments: "I want to live, the lord will help me," when asked her plan for d/c. "that is all I can tell you."       General Comments      Exercises     Assessment/Plan    PT Assessment Patient needs continued PT services  PT Problem List Decreased strength;Decreased activity tolerance;Decreased balance;Decreased mobility;Decreased coordination;Decreased cognition;Decreased safety awareness       PT Treatment Interventions Gait training;DME instruction;Functional mobility training;Therapeutic activities;Balance training;Patient/family education;Therapeutic exercise;Cognitive remediation    PT Goals (Current goals can be found in the Care Plan section)  Acute Rehab PT Goals Patient Stated Goal: to live PT Goal Formulation: With patient Time For Goal Achievement: 07/10/18 Potential to Achieve Goals: Fair    Frequency Min 3X/week   Barriers to  discharge Decreased caregiver support per pt, [name] is not able to help her as needed    Co-evaluation               AM-PAC PT "6 Clicks" Mobility  Outcome Measure Help needed turning from your back to your side while in a flat bed without using bedrails?: A Little Help needed moving from lying on your back to sitting on the side of a flat bed without using bedrails?: A Little Help needed moving to and from a bed to a chair (including a wheelchair)?: A Little Help needed standing up from a chair using your arms (e.g., wheelchair or bedside chair)?: A Little Help needed to walk in hospital room?: A Lot Help needed climbing 3-5 steps with a railing? : A Lot 6 Click Score: 16    End of Session Equipment Utilized During Treatment: Gait belt;Oxygen Activity Tolerance: Patient tolerated treatment well;Patient limited by fatigue Patient left: in bed;with call bell/phone within reach;with bed alarm set(sitting EOB with bed alarm on) Nurse Communication: Mobility status PT Visit Diagnosis: Muscle weakness (generalized) (M62.81);Difficulty in walking, not elsewhere classified (R26.2);Unsteadiness on feet (R26.81)    Time: 5102-5852 PT Time Calculation (min) (ACUTE ONLY): 22 min   Charges:   PT Evaluation $PT Eval Moderate Complexity: 1 Mod          Wray Kearns, PT, DPT Acute Rehabilitation Services Pager [phone] Office [phone]      Marguarite Arbour A Sabra Heck 06/26/2018, 11:23 AM

## 2018-06-26 NOTE — Progress Notes (Signed)
Pt awaiting meds before being placed on bipap.  RT spoke with RN and RN states she will place pt on device or call if RT is needed.

## 2018-06-26 NOTE — Progress Notes (Addendum)
CM talked to [facility] with [place]; patient will be receiving a Trilogy / Non invasive home ventilator at discharge; paperwork is completed; CM to notify [facility] when pt is ready for DC home patient has [facility] services provided by [facility] as prior to admission - RN/ PT/OT/ ST and home oxygen/CPAP through Jenetta Loges RN,MHA,BSN [phone]

## 2018-06-27 LAB — BASIC METABOLIC PANEL
Anion gap: 11 (ref 5–15)
BUN: 9 mg/dL (ref 6–20)
CO2: 35 mmol/L — AB (ref 22–32)
Calcium: 8.6 mg/dL — ABNORMAL LOW (ref 8.9–10.3)
Chloride: 89 mmol/L — ABNORMAL LOW (ref 98–111)
Creatinine, Ser: 1.04 mg/dL — ABNORMAL HIGH (ref 0.44–1.00)
GFR calc Af Amer: >60 mL/min (ref >60)
GFR calc non Af Amer: 59 mL/min — ABNORMAL LOW (ref >60)
Glucose, Bld: 109 mg/dL — ABNORMAL HIGH (ref 70–99)
Potassium: 3.6 mmol/L (ref 3.5–5.1)
Sodium: 135 mmol/L (ref 135–145)

## 2018-06-27 LAB — CBC
HCT: 29.4 % — ABNORMAL LOW (ref 36.0–46.0)
Hemoglobin: 8.1 g/dL — ABNORMAL LOW (ref 12.0–15.0)
MCH: 24.5 pg — ABNORMAL LOW (ref 26.0–34.0)
MCHC: 27.6 g/dL — ABNORMAL LOW (ref 30.0–36.0)
MCV: 88.8 fL (ref 80.0–100.0)
Platelets: 252 10*3/uL (ref 150–400)
RBC: 3.31 MIL/uL — ABNORMAL LOW (ref 3.87–5.11)
RDW: 29.7 % — ABNORMAL HIGH (ref 11.5–15.5)
WBC: 7.4 10*3/uL (ref 4.0–10.5)
nRBC: 0.3 % — ABNORMAL HIGH (ref 0.0–0.2)

## 2018-06-27 LAB — GLUCOSE, CAPILLARY
Glucose-Capillary: 196 mg/dL — ABNORMAL HIGH (ref 70–99)
Glucose-Capillary: 165 mg/dL — ABNORMAL HIGH (ref 70–99)
Glucose-Capillary: 145 mg/dL — ABNORMAL HIGH (ref 70–99)
Glucose-Capillary: 160 mg/dL — ABNORMAL HIGH (ref 70–99)

## 2018-06-27 MED ORDER — IPRATROPIUM-ALBUTEROL 0.5-2.5 (3) MG/3ML IN SOLN
3.0000 mL | Freq: Two times a day (BID) | RESPIRATORY_TRACT | Status: DC
  Administered 2018-06-27 – 2018-06-28 (×4): 3 mL via RESPIRATORY_TRACT
  Filled 2018-06-27 (×4): qty 3

## 2018-06-27 MED ORDER — IPRATROPIUM-ALBUTEROL 0.5-2.5 (3) MG/3ML IN SOLN
RESPIRATORY_TRACT | Status: AC
  Filled 2018-06-27: qty 3

## 2018-06-27 MED ORDER — FUROSEMIDE 10 MG/ML IJ SOLN
40.0000 mg | Freq: Two times a day (BID) | INTRAMUSCULAR | Status: AC
  Administered 2018-06-27 – 2018-06-28 (×2): 40 mg via INTRAVENOUS
  Filled 2018-06-27 (×2): qty 4

## 2018-06-27 MED ORDER — POTASSIUM CHLORIDE CRYS ER 20 MEQ PO TBCR
40.0000 meq | EXTENDED_RELEASE_TABLET | Freq: Once | ORAL | Status: AC
  Administered 2018-06-27: 40 meq via ORAL
  Filled 2018-06-27: qty 2

## 2018-06-27 MED ORDER — POLYETHYLENE GLYCOL 3350 17 G PO PACK
17.0000 g | PACK | Freq: Every day | ORAL | Status: DC
  Administered 2018-06-27 – 2018-06-28 (×2): 17 g via ORAL
  Filled 2018-06-27 (×2): qty 1

## 2018-06-27 MED ORDER — FUROSEMIDE 40 MG PO TABS: 40.0000 mg | ORAL_TABLET | Freq: Every day | ORAL | Status: DC

## 2018-06-27 NOTE — Progress Notes (Signed)
PROGRESS NOTE  Tamara Silva IRS:[id number] DOB: 01-13-1961 DOA: 06/24/2018 PCP: Antony Blackbird, MD   LOS: 1 day   Brief Narrative / Interim history: 58 year old female with history of OSA, COPD, chronic hypoxic respiratory failure(4 L at home), iron deficiency anemia. Patient was admitted to hospital from 12/30 to 1/10 withacute anemia with hgb 5.7, a right MCA/PCA watershed area stroke and a 2 cm infarct in the inferior right parietal lobe (felt to be incidental), with hypercapnic respiratory failure. Had tonic-clonic seizure and was intubated. Ultimately extubated and discharged on 1/10 with HGB 8.x (3u PRBC transfusion) and nightly BIPAP. She then spent from 1/10 to 1/23 in the rehab. HGB has slowly trended down during that time and was 7.3 on 1/21. Patient was discharged home x2 days ago on 1/23; however, unfortunately patient (who is now BIPAP dependent at night) does not have a BIPAP at home. She had been trying to increase O2at night to 6Lvia [name], but remained SOB, and returned to the ED on 1/26.  She was found to be fluid overloaded and started on Lasix.  Subjective: -She continues to complain of shortness of breath all the time when asked, and despite oxygen saturation of 100% on 3 L she keeps asking the RN to increase it to 4 L.  No chest pain.  No abdominal pain nausea or vomiting.  She complains of constipation  Assessment & Plan: Principal Problem:   Acute on chronic combined systolic (congestive) and diastolic (congestive) heart failure (HCC) Active Problems:   Essential hypertension   Acute and chr resp failure, unsp w hypoxia or hypercapnia (HCC)   Obesity hypoventilation syndrome (HCC)   Symptomatic anemia   Cerebral embolism with cerebral infarction   Diabetes mellitus type 2 in obese (HCC)   Acute on chronic combined systolic and diastolic CHF (congestive heart failure) (HCC)  Principal problem: Acute on chronic hypoxic and hypercarbic respiratory failure -Likely  worsening in the setting of not having home BiPAP.  She has pretty significant sleep apnea as well as chronic hypoxia due to her COPD, most recent ABGs reviewed were done just a week ago showed PCO2's in the 70-100 range consistently -Consulted care management, appreciate input, she qualifies for NIV and delivery is in process -Attempt to wean off oxygen as tolerated, maintain O2 sats above 90%  Active problems: Acute on chronic combined systolic and diastolic CHF -2D echo done on May 31, 2018 showed an EF of 40-45%, as well as grade 2 diastolic dysfunction -We will likely need ARB added if blood pressure tolerates and renal function stays stable close to discharge. -Repeat chest x-ray on 1/27 with continued venous congestion, renal function is stable and I would continue IV diuresis for today.  Her lungs still sound rhonchorous today -She is net -3.5 L, weight improved 304-302,  Acute on chronic iron deficiency anemia -Chronic, hemoglobin in the 7-8 range for past year, no bleeding -Hemoglobin 6.9 on admission, status post 2 units of packed red blood cells.  Overall stable. -Anemia panel revealed iron deficiency on 05/29/2018. Folate and B12 were okay -Start iron supplementation -Hemoglobin stable this morning, no bleeding  Insulin-dependent diabetes mellitus -Patient currently on detemir as well as sliding scale -CBGs overall stable, continue current regimen  Recent CVA -Consult PT, she was just in inpatient rehab for few weeks.  Continue aspirin.-PT recommended home health  Hyperlipidemia -Continue statin  Hypertension -Blood pressure stable, continue Coreg  Hypokalemia -Repleted yesterday, potassium better at 3.6 today, administer 1 more dose and recheck  tomorrow morning  Scheduled Meds: . sodium chloride   Intravenous Once  . aspirin  81 mg Oral Daily  . carvedilol  3.125 mg Oral BID WC  . enoxaparin (LOVENOX) injection  40 mg Subcutaneous Q24H  . ferrous  gluconate  324 mg Oral TID WC  . furosemide  40 mg Intravenous Q12H  . [START ON 06/29/2018] furosemide  40 mg Oral Daily  . insulin aspart  0-15 Units Subcutaneous TID WC  . insulin detemir  10 Units Subcutaneous QHS  . ipratropium-albuterol  3 mL Nebulization BID  . ipratropium-albuterol      . lactulose  30 g Oral BID  . nystatin   Topical BID  . pantoprazole  80 mg Oral Daily  . polyethylene glycol  17 g Oral Daily  . rosuvastatin  40 mg Oral q1800  . sodium chloride flush  3 mL Intravenous Q12H   Continuous Infusions: . sodium chloride     PRN Meds:.sodium chloride, acetaminophen, ondansetron (ZOFRAN) IV, sodium chloride flush  DVT prophylaxis: Lovenox Code Status: Full code Family Communication: No family present at bedside Disposition Plan: Home likely 1 to 2 days  Consultants:   None  Procedures:   None   Antimicrobials:  None    Objective: Vitals:   06/26/18 2120 06/27/18 0339 06/27/18 0504 06/27/18 0758  BP:  137/73  105/67  Pulse:  71  74  Resp:  18    Temp:  98 F (36.7 C)    TempSrc:      SpO2: 100% 99% 98% 100%  Weight:      Height:        Intake/Output Summary (Last 24 hours) at 06/27/2018 0835 Last data filed at 06/27/2018 0431 Gross per 24 hour  Intake 1103 ml  Output 2500 ml  Net -1397 ml   Filed Weights   06/24/18 1935 06/25/18 0158 06/26/18 0400  Weight: 120 kg (!) 137.9 kg (!) 137.2 kg    Examination:  Constitutional: NAD, sitting up in bed Eyes: No scleral icterus ENMT: Moist mixed membranes Neck: normal, supple Respiratory: Coarse breath sounds bilaterally, improved compared to yesterday, no wheezing heard.  Faint bibasilar crackles.  Moves air well. Cardiovascular: Regular rate and rhythm, no murmurs appreciated.  1+ lower extremity edema Abdomen: Soft, nontender, nondistended, positive bowel sounds Musculoskeletal: no clubbing / cyanosis Skin: No rashes seen Neurologic: No focal deficits   Data Reviewed: I have  independently reviewed following labs and imaging studies   CBC: Recent Labs  Lab 06/20/18 1134 06/24/18 2039 06/25/18 0733 06/25/18 2045 06/26/18 0416 06/27/18 0400  WBC 8.5 7.7 6.7  --  6.8 7.4  NEUTROABS  --  5.2  --   --   --   --   HGB 7.3* 6.9* 7.4* 8.3* 8.3* 8.1*  HCT 28.4* 26.1* 26.9* 29.4* 30.7* 29.4*  MCV 87.4 90.0 87.1  --  88.5 88.8  PLT 235 264 245  --  238 163   Basic Metabolic Panel: Recent Labs  Lab 06/21/18 0611 06/24/18 2039 06/25/18 0733 06/26/18 0416 06/27/18 0400  NA 138 136 137 136 135  K 3.5 3.5 3.6 3.1* 3.6  CL 92* 94* 93* 90* 89*  CO2 38* 38* 35* 36* 35*  GLUCOSE 105* 224* 175* 116* 109*  BUN 13 12 9 10 9   CREATININE 0.97 1.04* 1.04* 1.10* 1.04*  CALCIUM 8.7* 8.6* 8.6* 8.5* 8.6*  MG  --  1.7  --   --   --    GFR: Estimated Creatinine  Clearance: 85.4 mL/min (A) (by C-G formula based on SCr of 1.04 mg/dL (H)). Liver Function Tests: Recent Labs  Lab 06/24/18 2039  AST 26  ALT 15  ALKPHOS 111  BILITOT 1.2  PROT 8.6*  ALBUMIN 2.4*   No results for input(s): LIPASE, AMYLASE in the last 168 hours. No results for input(s): AMMONIA in the last 168 hours. Coagulation Profile: No results for input(s): INR, PROTIME in the last 168 hours. Cardiac Enzymes: No results for input(s): CKTOTAL, CKMB, CKMBINDEX, TROPONINI in the last 168 hours. BNP (last 3 results) No results for input(s): PROBNP in the last 8760 hours. HbA1C: No results for input(s): HGBA1C in the last 72 hours. CBG: Recent Labs  Lab 06/26/18 0732 06/26/18 1148 06/26/18 1640 06/26/18 2132 06/27/18 0737  GLUCAP 104* 170* 154* 133* 196*   Lipid Profile: No results for input(s): CHOL, HDL, LDLCALC, TRIG, CHOLHDL, LDLDIRECT in the last 72 hours. Thyroid Function Tests: No results for input(s): TSH, T4TOTAL, FREET4, T3FREE, THYROIDAB in the last 72 hours. Anemia Panel: No results for input(s): VITAMINB12, FOLATE, FERRITIN, TIBC, IRON, RETICCTPCT in the last 72 hours. Urine  analysis:    Component Value Date/Time   COLORURINE YELLOW 06/24/2018 1937   APPEARANCEUR HAZY (A) 06/24/2018 1937   LABSPEC 1.017 06/24/2018 1937   PHURINE 7.0 06/24/2018 1937   GLUCOSEU NEGATIVE 06/24/2018 1937   HGBUR NEGATIVE 06/24/2018 1937   BILIRUBINUR NEGATIVE 06/24/2018 1937   KETONESUR NEGATIVE 06/24/2018 1937   PROTEINUR 100 (A) 06/24/2018 1937   UROBILINOGEN 2.0 (H) 02/21/2015 1914   NITRITE NEGATIVE 06/24/2018 1937   LEUKOCYTESUR NEGATIVE 06/24/2018 1937   Sepsis Labs: Invalid input(s): PROCALCITONIN, LACTICIDVEN  Recent Results (from the past 240 hour(s))  Urine culture     Status: Abnormal   Collection Time: 06/24/18  7:45 PM  Result Value Ref Range Status   Specimen Description URINE, CLEAN CATCH  Final   Special Requests   Final    NONE Performed at [facility], [address], [place], [place] [address]    Culture MULTIPLE SPECIES PRESENT, SUGGEST RECOLLECTION (A)  Final   Report Status 06/26/2018 FINAL  Final      Radiology Studies: Dg Chest Port 1 View  Result Date: 06/26/2018 CLINICAL DATA:  Cough and shortness of Breath EXAM: PORTABLE CHEST 1 VIEW COMPARISON:  06/24/2018 FINDINGS: Cardiac shadow is enlarged but stable. Scarring is noted in the lateral aspect of the left lung. Central vascular congestion is again identified and stable. No new focal infiltrate is seen. No bony abnormality is noted. IMPRESSION: Stable CHF. Scarring in the left mid lung is noted and stable. Electronically Signed   By: Inez Catalina M.D.   On: 06/26/2018 09:51    Marzetta Board, MD, PhD [facility]  Contact via  [email or web address]  [place] P: [phone]  F: [phone]

## 2018-06-27 NOTE — Progress Notes (Signed)
Physical Therapy Treatment Patient Details Name: Tamara Silva MRN: [id number] DOB: 1960-12-30 Today's Date: 06/27/2018    History of Present Illness PAtient is a 58 y/o female who presents with SOB. Admitted with Acute on chronic hypoxic and hypercarbic respiratory failure and Acute on chronic combined systolic and diastolic CHF. Recent admission to CIR from 1/10-1/23. PMH includes OSA, CVA, seizures, asthma, HF, DM, HTN, anemia.    PT Comments    Patient progressing slowly towards PT goals. Requires mod A to stand from all surfaces today. Tolerated short distance ambulation to/from bathroom with Min A due to instability and weakness. Sp02 dropped to 77% on 4L/min 02 with 2/4 DOE. Required cues for pursed lip breathing and rest break to improve sats to 90%. Total A for pericare. Will continue to follow and progress as tolerated.     Follow Up Recommendations  Home health PT;Supervision for mobility/OOB     Equipment Recommendations  None recommended by PT    Recommendations for Other Services       Precautions / Restrictions Precautions Precautions: Fall Precaution Comments: 4L 02 at baseline Restrictions Weight Bearing Restrictions: No    Mobility  Bed Mobility Overal bed mobility: Needs Assistance Bed Mobility: Supine to Sit     Supine to sit: Min guard;HOB elevated     General bed mobility comments: Able to get to EOB with heavy use of rail for support; increased time and coaxing.  Transfers Overall transfer level: Needs assistance Equipment used: Rolling walker (2 wheeled) Transfers: Sit to/from Stand Sit to Stand: Mod assist;+2 physical assistance         General transfer comment: Assist of 2 to stand from EOB with use of momentum; not able to stand without assist. Stood from EOB x1, from toilet x1. Increased effort/time.  Ambulation/Gait Ambulation/Gait assistance: Min assist Gait Distance (Feet): 15 Feet(x2 bouts) Assistive device: Rolling walker (2  wheeled) Gait Pattern/deviations: Step-through pattern;Decreased stride length Gait velocity: decreased   General Gait Details: Slow, unsteady gait with bil knee instability. Sp02 dropped to 77% on 4L/min 02. Cues for pursed lip breathing. + productive cough.   Stairs             Wheelchair Mobility    Modified Rankin (Stroke Patients Only) Modified Rankin (Stroke Patients Only) Pre-Morbid Rankin Score: Moderate disability Modified Rankin: Severe disability     Balance Overall balance assessment: Needs assistance Sitting-balance support: Feet supported;No upper extremity supported Sitting balance-Leahy Scale: Fair Sitting balance - Comments: able to maintain static sitting with min guard assist.    Standing balance support: During functional activity;Bilateral upper extremity supported Standing balance-Leahy Scale: Poor Standing balance comment: Reliant on BUE support in standing. Total A for pericare.                            Cognition Arousal/Alertness: Awake/alert Behavior During Therapy: WFL for tasks assessed/performed Overall Cognitive Status: Impaired/Different from baseline Area of Impairment: Attention;Memory;Following commands;Safety/judgement;Awareness;Problem solving                   Current Attention Level: Selective Memory: Decreased short-term memory Following Commands: Follows one step commands with increased time Safety/Judgement: Decreased awareness of safety;Decreased awareness of deficits Awareness: Intellectual;Emergent Problem Solving: Slow processing;Decreased initiation;Difficulty sequencing;Requires verbal cues;Requires tactile cues        Exercises      General Comments        Pertinent Vitals/Pain Pain Assessment: No/denies pain    Home Living  Prior Function            PT Goals (current goals can now be found in the care plan section) Progress towards PT goals: Progressing  toward goals    Frequency    Min 3X/week      PT Plan Current plan remains appropriate    Co-evaluation              AM-PAC PT "6 Clicks" Mobility   Outcome Measure  Help needed turning from your back to your side while in a flat bed without using bedrails?: A Little Help needed moving from lying on your back to sitting on the side of a flat bed without using bedrails?: A Little Help needed moving to and from a bed to a chair (including a wheelchair)?: A Lot Help needed standing up from a chair using your arms (e.g., wheelchair or bedside chair)?: A Lot Help needed to walk in hospital room?: A Little Help needed climbing 3-5 steps with a railing? : A Lot 6 Click Score: 15    End of Session Equipment Utilized During Treatment: Gait belt;Oxygen Activity Tolerance: Treatment limited secondary to medical complications (Comment)(drop in Sp02 ) Patient left: in bed;with call bell/phone within reach;with bed alarm set Nurse Communication: Mobility status;Other (comment)(02 sats) PT Visit Diagnosis: Muscle weakness (generalized) (M62.81);Difficulty in walking, not elsewhere classified (R26.2);Unsteadiness on feet (R26.81)     Time: 5277-8242 PT Time Calculation (min) (ACUTE ONLY): 29 min  Charges:  $Gait Training: 8-22 mins $Therapeutic Activity: 8-22 mins                     Wray Kearns, [place], DPT Acute Rehabilitation Services Pager [phone] Office [phone]       [place] 06/27/2018, 11:52 AM

## 2018-06-27 NOTE — Plan of Care (Signed)
?  Problem: Activity: ?Goal: Capacity to carry out activities will improve ?Outcome: Progressing ?  ?Problem: Clinical Measurements: ?Goal: Respiratory complications will improve ?Outcome: Progressing ?  ?Problem: Activity: ?Goal: Risk for activity intolerance will decrease ?Outcome: Progressing ?  ?

## 2018-06-27 NOTE — Progress Notes (Signed)
Patient said she cant sleep on the cpap. I took it off per patient request, and out her back on 3L nasal cannula. Will continue to monitor.

## 2018-06-28 ENCOUNTER — Encounter (HOSPITAL_COMMUNITY): Payer: Self-pay

## 2018-06-28 ENCOUNTER — Inpatient Hospital Stay: Payer: Self-pay | Admitting: Acute Care

## 2018-06-28 LAB — GLUCOSE, CAPILLARY
Glucose-Capillary: 90 mg/dL (ref 70–99)
Glucose-Capillary: 138 mg/dL — ABNORMAL HIGH (ref 70–99)

## 2018-06-28 LAB — CBC
HCT: 29.4 % — ABNORMAL LOW (ref 36.0–46.0)
Hemoglobin: 8.2 g/dL — ABNORMAL LOW (ref 12.0–15.0)
MCH: 25.2 pg — ABNORMAL LOW (ref 26.0–34.0)
MCHC: 27.9 g/dL — AB (ref 30.0–36.0)
MCV: 90.2 fL (ref 80.0–100.0)
Platelets: 252 10*3/uL (ref 150–400)
RBC: 3.26 MIL/uL — ABNORMAL LOW (ref 3.87–5.11)
RDW: 29.7 % — ABNORMAL HIGH (ref 11.5–15.5)
WBC: 6.8 10*3/uL (ref 4.0–10.5)
nRBC: 0.4 % — ABNORMAL HIGH (ref 0.0–0.2)

## 2018-06-28 LAB — BASIC METABOLIC PANEL
Anion gap: 8 (ref 5–15)
BUN: 10 mg/dL (ref 6–20)
CO2: 37 mmol/L — ABNORMAL HIGH (ref 22–32)
Calcium: 8.6 mg/dL — ABNORMAL LOW (ref 8.9–10.3)
Chloride: 93 mmol/L — ABNORMAL LOW (ref 98–111)
Creatinine, Ser: 1.09 mg/dL — ABNORMAL HIGH (ref 0.44–1.00)
GFR calc Af Amer: >60 mL/min (ref >60)
GFR calc non Af Amer: 56 mL/min — ABNORMAL LOW (ref >60)
GLUCOSE: 86 mg/dL (ref 70–99)
Potassium: 3.7 mmol/L (ref 3.5–5.1)
Sodium: 138 mmol/L (ref 135–145)

## 2018-06-28 MED ORDER — FERROUS GLUCONATE 324 (38 FE) MG PO TABS: 324.0000 mg | ORAL_TABLET | Freq: Two times a day (BID) | ORAL | 0 refills | Status: DC

## 2018-06-28 MED ORDER — IPRATROPIUM-ALBUTEROL 0.5-2.5 (3) MG/3ML IN SOLN
3.0000 mL | Freq: Four times a day (QID) | RESPIRATORY_TRACT | Status: DC | PRN
  Administered 2018-06-28: 3 mL via RESPIRATORY_TRACT

## 2018-06-28 MED ORDER — BUDESONIDE-FORMOTEROL FUMARATE 80-4.5 MCG/ACT IN AERO: 2.0000 | INHALATION_SPRAY | Freq: Two times a day (BID) | RESPIRATORY_TRACT | 0 refills | Status: DC

## 2018-06-28 MED ORDER — FUROSEMIDE 40 MG PO TABS: 40.0000 mg | ORAL_TABLET | Freq: Two times a day (BID) | ORAL | Status: DC

## 2018-06-28 MED ORDER — FUROSEMIDE 40 MG PO TABS: 40.0000 mg | ORAL_TABLET | Freq: Every day | ORAL | 0 refills | Status: DC

## 2018-06-28 MED ORDER — PREDNISONE 20 MG PO TABS: 40.0000 mg | ORAL_TABLET | Freq: Every day | ORAL | 0 refills | Status: AC

## 2018-06-28 MED ORDER — ALBUTEROL SULFATE HFA 108 (90 BASE) MCG/ACT IN AERS: 2.0000 | INHALATION_SPRAY | Freq: Four times a day (QID) | RESPIRATORY_TRACT | 0 refills | Status: DC | PRN

## 2018-06-28 MED ORDER — POTASSIUM CHLORIDE ER 20 MEQ PO TBCR: 20.0000 meq | EXTENDED_RELEASE_TABLET | Freq: Every day | ORAL | 0 refills | Status: DC

## 2018-06-28 NOTE — Discharge Summary (Signed)
Physician Discharge Summary  Tamara Silva JAS:[id number] DOB: [birth year] DOA: 06/24/2018  PCP: Antony Blackbird, MD  Admit date: 06/24/2018 Discharge date: 06/28/2018  Admitted From: Home Disposition:  Home  Discharge Condition:Stable CODE STATUS:FULL Diet recommendation: Heart Healthy   Brief/Interim Summary: 58 year old female with history ofOSA, COPD, chronic hypoxic respiratory failure(4 L at home), iron deficiency anemia. Patient was admitted to hospital from 12/30 to 1/10 withacute anemia with hgb 5.7, a right MCA/PCA watershed area stroke and a 2 cm infarct in the inferior right parietal lobe (felt to be incidental), with hypercapnic respiratory failure. Had tonic-clonic seizure and was intubated. Ultimately extubated and discharged on 1/10 with HGB 8.x (3u PRBC transfusion) and nightly BIPAP. She then spent from 1/10 to 1/23 in the rehab. HGB has slowly trended down during that time and was 7.3 on 1/21. Patient was discharged home x2 days ago on 1/23; however, unfortunately patient (who is now BIPAP dependent at night) does not have a BIPAP at home. She had been trying to increase O2at night to 6Lvia [place], but remained SOB, and returned to the ED on 1/26.  She was found to be fluid overloaded and started on Lasix. She was started on aggressive diuresis.  Currently she is euvolemic.  Her respiratory status is improved.  Case manager was consulted and her home BiPAP has been arranged.  She is already on oxygen at home.  She will follow-up with pulmonology and cardiology as an outpatient.  Following problems were addressed during her  hospitalization:  Acute on chronic hypoxic and hypercarbic respiratory failure -Likely worsening in the setting of not having home BiPAP. She has pretty significant sleep apnea as well as chronic hypoxia due to her COPD, most recent ABGs reviewed were done just a week ago showed PCO2's in the 70-100 range consistently -Consulted care management,she qualified  for NIV and delivery will be done today -She on home oxygen at 4 L/min -She has an appointment with pulmonology today.  Needs to reschedule her appointment. -She has history of COPD.  Not on home inhalers.  Started on albuterol and Symbicort.  Also found to have mild expiratory wheezes today.  Started on prednisone.   Acute on chronic combined systolic and diastolic CHF -2D echo done on May 31, 2018 showed an EF of 40-45%, as well as grade 2 diastolic dysfunction -We will likely need ARB added if blood pressure tolerates.  Blood pressure is still soft.  She will follow-up with cardiology and she can be started on ACE/ARB -Start on Lasix 40 mg daily.  Acute on chronic iron deficiency anemia -Chronic, hemoglobin in the 7-8 range for past year, no bleeding -Hemoglobin 6.9 on admission, status post 2 units of packed red blood cells.  Overall stable. -Anemia panel revealed iron deficiency on 05/29/2018. Folate and B12 were okay -Started iron supplementation -Hemoglobin stable this morning, no bleeding  Insulin-dependent diabetes mellitus -Patient currently on detemir as well as sliding scale -CBGs overall stable, continue current regimen  Recent CVA -Continue aspirin. -PT recommended home health -Follow-up with neurology as an outpatient  Hyperlipidemia -Continue statin  Hypertension -Blood pressure stable, continue Coreg  Hypokalemia -Continue supplementation.  Check BMP in a week    Discharge Diagnoses:  Principal Problem:   Acute on chronic combined systolic (congestive) and diastolic (congestive) heart failure (HCC) Active Problems:   Essential hypertension   Acute and chr resp failure, unsp w hypoxia or hypercapnia (HCC)   Obesity hypoventilation syndrome (HCC)   Symptomatic anemia   Cerebral embolism  with cerebral infarction   Diabetes mellitus type 2 in obese (HCC)   Acute on chronic combined systolic and diastolic CHF (congestive heart failure)  [facility])    Discharge Instructions  Discharge Instructions    Diet - low sodium heart healthy   Complete by:  As directed    Discharge instructions   Complete by:  As directed    1)Take prescribed medication as instructed. 2)Follow up with pulmonology and cardiology as an outpatient. 3)Follow up with your PCP in a week.  Do a CBC, BMP test during the follow-up 4)Follow up with Home health.   Increase activity slowly   Complete by:  As directed      Allergies as of 06/28/2018      Reactions   Metolazone Other (See Comments)   Dizziness and falling   Plavix [clopidogrel Bisulfate] Other (See Comments)   High PRU's - non-responder   Ibuprofen    Patient stated she can not take ibuprofen.    Nsaids Other (See Comments)   "I do not take NSAIDs d/t interfering with other meds"      Medication List    TAKE these medications   acetaminophen 325 MG tablet Commonly known as:  TYLENOL Take 1-2 tablets (325-650 mg total) by mouth every 4 (four) hours as needed for mild pain.   albuterol 108 (90 Base) MCG/ACT inhaler Commonly known as:  PROVENTIL HFA;VENTOLIN HFA Inhale 2 puffs into the lungs every 6 (six) hours as needed for wheezing or shortness of breath.   aspirin 81 MG tablet Take 1 tablet (81 mg total) by mouth daily.   budesonide-formoterol 80-4.5 MCG/ACT inhaler Commonly known as:  SYMBICORT Inhale 2 puffs into the lungs 2 times daily at 12 noon and 4 pm.   carvedilol 3.125 MG tablet Commonly known as:  COREG Take 1 tablet (3.125 mg total) by mouth 2 (two) times daily with a meal.   ferrous gluconate 324 MG tablet Commonly known as:  FERGON Take 1 tablet (324 mg total) by mouth 2 (two) times daily with a meal.   furosemide 40 MG tablet Commonly known as:  LASIX Take 1 tablet (40 mg total) by mouth daily.   hydrocerin Crea Apply 1 application topically 2 (two) times daily.   Insulin Detemir 100 UNIT/ML Pen Commonly known as:  LEVEMIR FLEXTOUCH Inject 18 Units  into the skin daily. What changed:  when to take this   ipratropium-albuterol 0.5-2.5 (3) MG/3ML Soln Commonly known as:  DUONEB Use nebulizer to inhale contents of one vial every 6 hours as needed for shortness of breath, wheezing   lactulose 10 GM/15ML solution Commonly known as:  CHRONULAC Take 45 mLs (30 g total) by mouth 2 (two) times daily.   NEEDLE (DISP) 26 G 26G X 1/2" Misc 1 application by Does not apply route daily.   nitroGLYCERIN 0.4 MG SL tablet Commonly known as:  NITROSTAT Place 1 tablet (0.4 mg total) under the tongue every 5 (five) minutes as needed for chest pain.   omeprazole 40 MG capsule Commonly known as:  PRILOSEC Take 1 capsule (40 mg total) by mouth daily.   Potassium Chloride ER 20 MEQ Tbcr Take 20 mEq by mouth daily for 7 days.   predniSONE 20 MG tablet Commonly known as:  DELTASONE Take 2 tablets (40 mg total) by mouth daily for 5 days.   rosuvastatin 40 MG tablet Commonly known as:  CRESTOR Take 1 tablet (40 mg total) by mouth daily at 6 PM.  [facility] Follow up.   Why:  NIV to be delivered to room prior to DC on 06/26/2018 Contact information: [address] [address] [phone]        [facility], [facility] Follow up.   Specialty:  Home Health Services Why:  They will continue to do your home health care at your home Contact information: [address] [place] [address] [phone]          Allergies  Allergen Reactions  . Metolazone Other (See Comments)    Dizziness and falling  . Plavix [Clopidogrel Bisulfate] Other (See Comments)    High PRU's - non-responder  . Ibuprofen     Patient stated she can not take ibuprofen.   . Nsaids Other (See Comments)    "I do not take NSAIDs d/t interfering with other meds"    Consultations:  None   Procedures/Studies: Ct Abdomen Pelvis Wo Contrast  Result Date: 05/29/2018 CLINICAL DATA:  Abdominal  distension. Severe anemia. GI bleed. EXAM: CT ABDOMEN AND PELVIS WITHOUT CONTRAST TECHNIQUE: Multidetector CT imaging of the abdomen and pelvis was performed following the standard protocol without IV contrast. COMPARISON:  06/23/2015 abdominal sonogram. FINDINGS: Lower chest: Mild patchy consolidation in peripheral right middle lobe. Patchy tree-in-bud opacity in the dependent right lower lobe. Parenchymal banding at both lung bases compatible with postinfectious/postinflammatory scarring. Mild cardiomegaly. Right coronary atherosclerosis. Posterior left pleural coarse calcification. Hepatobiliary: Diffuse hepatic steatosis. No definite liver surface irregularity. No liver mass. Contracted gallbladder with no radiopaque cholelithiasis. No biliary ductal dilatation. Pancreas: Normal, with no mass or duct dilation. Spleen: Normal size. No mass. Adrenals/Urinary Tract: Normal adrenals. No hydronephrosis. No renal stones. No contour deforming renal masses. Normal bladder. Stomach/Bowel: Mild gaseous distention of the stomach with gastric fluid level. No definite gastric wall thickening. Normal caliber small bowel with no small bowel wall thickening. Candidate appendix appears normal. Mild sigmoid diverticulosis, with no large bowel wall thickening or significant pericolonic fat stranding. Vascular/Lymphatic: Atherosclerotic nonaneurysmal abdominal aorta. No pathologically enlarged lymph nodes in the abdomen or pelvis. Reproductive: Grossly normal uterus.  No adnexal mass. Other: No pneumoperitoneum. Small to moderate volume ascites. No focal fluid collection. Moderate anasarca. Musculoskeletal: No aggressive appearing focal osseous lesions. IMPRESSION: 1. No evidence of bowel obstruction or acute bowel inflammation. Mild sigmoid diverticulosis, with no evidence of acute diverticulitis. 2. Small to moderate volume ascites. Moderate anasarca. 3. Mild patchy consolidation in the peripheral right middle lobe. Patchy  tree-in-bud opacity in the dependent right lower lobe. Findings suggest a mild bronchopneumonia, with the differential including aspiration. Suggest attention on follow-up chest imaging in 3 months. 4. Diffuse hepatic steatosis. 5.  Aortic Atherosclerosis (ICD10-I70.0). Electronically Signed   By: Ilona Sorrel M.D.   On: 05/29/2018 23:52   Dg Chest 2 View  Result Date: 06/24/2018 CLINICAL DATA:  Shortness of breath. EXAM: CHEST - 2 VIEW COMPARISON:  06/12/2018. FINDINGS: Stable enlarged cardiac silhouette and prominent interstitial markings. Small pleural effusions. Stable scarring in the left lateral chest. Unremarkable bones. IMPRESSION: Stable cardiomegaly and mild changes of congestive heart failure. Electronically Signed   By: Claudie Revering M.D.   On: 06/24/2018 20:16   Dg Chest 2 View  Result Date: 06/12/2018 CLINICAL DATA:  Cough, wheezing and shortness of breath. EXAM: CHEST - 2 VIEW COMPARISON:  Single-view of the chest 06/09/2018 and 06/04/2018. PA and lateral chest 07/25/2017. CT chest 06/28/2015. FINDINGS: Cardiomegaly and pulmonary edema are  chronic and appear unchanged. Pleural scar and calcification along the left upper chest wall are again identified. No pneumothorax or pleural effusion. No acute bony abnormality. IMPRESSION: Cardiomegaly and chronic mild pulmonary edema. Electronically Signed   By: Inge Rise M.D.   On: 06/12/2018 08:58   Dg Abd 1 View  Result Date: 06/12/2018 CLINICAL DATA:  Patient complains of constipation. EXAM: ABDOMEN - 1 VIEW COMPARISON:  June 05, 2018 FINDINGS: Mild fecal loading in the colon. No bowel obstruction. No free air, portal venous gas, pneumatosis seen on supine imaging. No other acute abnormalities. IMPRESSION: Mild fecal loading in the colon. Electronically Signed   By: Dorise Bullion III M.D   On: 06/12/2018 19:39   Ct Head Wo Contrast  Result Date: 06/04/2018 CLINICAL DATA:  Followup right MCA territory strokes. Altered mental status.  EXAM: CT HEAD WITHOUT CONTRAST TECHNIQUE: Contiguous axial images were obtained from the base of the skull through the vertex without intravenous contrast. COMPARISON:  Previous CT and MR examinations 05/31/2018 and 06/01/2018 FINDINGS: Brain: The brainstem and cerebellum appear normal. Left hemisphere appears normal. Old lacunar infarction in the right caudate as seen previously. Low-density in the right parieto-occipital cortex and the right parietal cortical and subcortical brain as seen on previous MR consistent with recent infarction. No evidence of extension. No evidence of hemorrhage. No significant mass effect. No extra-axial collection. No hydrocephalus. Vascular: There is atherosclerotic calcification of the major vessels at the base of the brain. Skull: Negative Sinuses/Orbits: Clear sinuses. Orbits negative except for an old medial wall blowout fracture on the left. Other: None IMPRESSION: Low-density in the right parieto-occipital cortex and right parietal cortical and subcortical brain consistent with recent infarction. No evidence of extension, hemorrhage or significant mass effect. Electronically Signed   By: Nelson Chimes M.D.   On: 06/04/2018 11:57   Ct Head Wo Contrast  Result Date: 05/31/2018 CLINICAL DATA:  Encephalopathy EXAM: CT HEAD WITHOUT CONTRAST TECHNIQUE: Contiguous axial images were obtained from the base of the skull through the vertex without intravenous contrast. COMPARISON:  None. FINDINGS: Brain: Small area of cytotoxic edema appearance along the right occipital parietal convexity with questionable involvement more anteriorly at the level of the parietal lobe. No definite left-sided edema. No posterior fossa abnormality is seen. There is a lacunar infarct in the right caudate head that has a circumscribed low-density appearance suggesting chronicity on the coronal reformats. Vascular: Negative Skull: Negative Sinuses/Orbits: Negative These results were called by telephone at the  time of interpretation on 05/31/2018 at 3:36 pm to Dr. Niel Hummer , who verbally acknowledged these results. IMPRESSION: 1. Moderate, acute to subacute cortex infarct at the right occipitoparietal convexity. 2. Remote lacunar infarct in the right caudate. 3. Motion degraded. Electronically Signed   By: Monte Fantasia M.D.   On: 05/31/2018 15:37   Mr Virgel Paling WJ Contrast  Result Date: 06/01/2018 CLINICAL DATA:  58 year old female with heart failure, iron deficiency anemia, chronic kidney disease. Weakness, encephalopathy. EXAM: MRI HEAD WITHOUT CONTRAST MRA HEAD WITHOUT CONTRAST TECHNIQUE: Multiplanar, multiecho pulse sequences of the brain and surrounding structures were obtained without intravenous contrast. Angiographic images of the head were obtained using MRA technique without contrast. COMPARISON:  Head CT 05/31/2018 FINDINGS: MRI HEAD FINDINGS Brain: The examination had to be discontinued prior to completion due to patient motion. Sagittal T1 weighted imaging was not obtained, and other sequences are intermittently degraded by motion. Additionally, T2* imaging, time-of-flight MRA, and diffusion imaging to a lesser extent are degraded by  extensive intravascular susceptibility which is probably related to recent intravenous iron administration. There is a 2 centimeter area of restricted diffusion in the inferior right parietal lobe posteriorly on series 5, image 54. There is associated gyriform cytotoxic edema. No significant mass effect and no definite hemorrhage. Additional patchy 3-4 centimeter area of abnormal diffusion in the posterior right frontal lobe and right parietal lobe white matter on series 5, image 57 is isointense on ADC with mild T2 and FLAIR hyperintensity. No mass effect. No left hemisphere or posterior fossa restricted diffusion. No midline shift, mass effect, evidence of mass lesion, ventriculomegaly, extra-axial collection or extra-axial hemorrhage. Cervicomedullary junction and  pituitary are within normal limits. Questionable small area of cortical encephalomalacia in the left superior parietal lobe on series 13, image 20. Small chronic lacunar infarct in the right caudate. Vascular: Major intracranial vascular flow voids are preserved. Skull and upper cervical spine: Not well evaluated in the absence of sagittal T1 weighted imaging. Sinuses/Orbits: Negative orbits. Trace fluid layering in the right sphenoid sinus. Other paranasal sinuses are well pneumatized. Chronic left lamina papyracea fracture suspected. Other: Trace left mastoid effusion. Trace retained secretions in the nasopharynx. The right mastoids are clear. Scalp and face soft tissues appear negative. MRA HEAD FINDINGS Severely limited by a combination of motion and vascular susceptibility artifact probably related to recent intravenous iron administration. The distal vertebral arteries and basilar artery appear to be patent. Both ICA siphons and carotid termini appear to be patent. The 1st order circle-of-Willis branches (A1, M1, and P1) appear to be patent bilaterally. IMPRESSION: 1. Study limited by suspected recent intravenous iron administration, and also motion artifact. 2. Positive for Acute 2 cm Infarct in the inferior right parietal lobe, Right MCA/PCA watershed area corresponding to the recent CT finding. No associated mass effect. 3. Superimposed patchy Subacute Infarct in the posterior Right MCA territory (series 5, image 57). No mass effect. 4. Limited MRA suggests no intracranial large vessel occlusion. 5. Chronic lacunar infarct right caudate nucleus. Electronically Signed   By: Genevie Ann M.D.   On: 06/01/2018 07:02   Mr Brain Wo Contrast  Result Date: 06/01/2018 CLINICAL DATA:  58 year old female with heart failure, iron deficiency anemia, chronic kidney disease. Weakness, encephalopathy. EXAM: MRI HEAD WITHOUT CONTRAST MRA HEAD WITHOUT CONTRAST TECHNIQUE: Multiplanar, multiecho pulse sequences of the brain and  surrounding structures were obtained without intravenous contrast. Angiographic images of the head were obtained using MRA technique without contrast. COMPARISON:  Head CT 05/31/2018 FINDINGS: MRI HEAD FINDINGS Brain: The examination had to be discontinued prior to completion due to patient motion. Sagittal T1 weighted imaging was not obtained, and other sequences are intermittently degraded by motion. Additionally, T2* imaging, time-of-flight MRA, and diffusion imaging to a lesser extent are degraded by extensive intravascular susceptibility which is probably related to recent intravenous iron administration. There is a 2 centimeter area of restricted diffusion in the inferior right parietal lobe posteriorly on series 5, image 54. There is associated gyriform cytotoxic edema. No significant mass effect and no definite hemorrhage. Additional patchy 3-4 centimeter area of abnormal diffusion in the posterior right frontal lobe and right parietal lobe white matter on series 5, image 57 is isointense on ADC with mild T2 and FLAIR hyperintensity. No mass effect. No left hemisphere or posterior fossa restricted diffusion. No midline shift, mass effect, evidence of mass lesion, ventriculomegaly, extra-axial collection or extra-axial hemorrhage. Cervicomedullary junction and pituitary are within normal limits. Questionable small area of cortical encephalomalacia in the left superior  parietal lobe on series 13, image 20. Small chronic lacunar infarct in the right caudate. Vascular: Major intracranial vascular flow voids are preserved. Skull and upper cervical spine: Not well evaluated in the absence of sagittal T1 weighted imaging. Sinuses/Orbits: Negative orbits. Trace fluid layering in the right sphenoid sinus. Other paranasal sinuses are well pneumatized. Chronic left lamina papyracea fracture suspected. Other: Trace left mastoid effusion. Trace retained secretions in the nasopharynx. The right mastoids are clear. Scalp  and face soft tissues appear negative. MRA HEAD FINDINGS Severely limited by a combination of motion and vascular susceptibility artifact probably related to recent intravenous iron administration. The distal vertebral arteries and basilar artery appear to be patent. Both ICA siphons and carotid termini appear to be patent. The 1st order circle-of-Willis branches (A1, M1, and P1) appear to be patent bilaterally. IMPRESSION: 1. Study limited by suspected recent intravenous iron administration, and also motion artifact. 2. Positive for Acute 2 cm Infarct in the inferior right parietal lobe, Right MCA/PCA watershed area corresponding to the recent CT finding. No associated mass effect. 3. Superimposed patchy Subacute Infarct in the posterior Right MCA territory (series 5, image 57). No mass effect. 4. Limited MRA suggests no intracranial large vessel occlusion. 5. Chronic lacunar infarct right caudate nucleus. Electronically Signed   By: Genevie Ann M.D.   On: 06/01/2018 07:02   Dg Chest Port 1 View  Result Date: 06/26/2018 CLINICAL DATA:  Cough and shortness of Breath EXAM: PORTABLE CHEST 1 VIEW COMPARISON:  06/24/2018 FINDINGS: Cardiac shadow is enlarged but stable. Scarring is noted in the lateral aspect of the left lung. Central vascular congestion is again identified and stable. No new focal infiltrate is seen. No bony abnormality is noted. IMPRESSION: Stable CHF. Scarring in the left mid lung is noted and stable. Electronically Signed   By: Inez Catalina M.D.   On: 06/26/2018 09:51   Dg Chest Port 1 View  Result Date: 06/05/2018 CLINICAL DATA:  Respiratory failure, post intubation EXAM: PORTABLE CHEST 1 VIEW COMPARISON:  Portable exam 5284 hours compared to 05/30/2018 FINDINGS: Endotracheal tube tip projects 4.8 cm above carina. Enlargement of cardiac silhouette. Mediastinal contours normal. Improved RIGHT lung infiltrates though mild residual seen at both the RIGHT upper lobe and at the RIGHT base. Scarring at  the lateral LEFT hemithorax unchanged. No gross pleural effusion or pneumothorax. IMPRESSION: Enlargement of cardiac silhouette. LEFT lung scarring. Improved RIGHT lung infiltrates. Electronically Signed   By: Lavonia Dana M.D.   On: 06/05/2018 00:10   Dg Chest Port 1 View  Result Date: 05/30/2018 CLINICAL DATA:  Assess for congestive heart failure. Concern for aspiration pneumonia. EXAM: PORTABLE CHEST 1 VIEW COMPARISON:  Chest radiograph performed 01/13/2018 FINDINGS: Diffuse right-sided airspace opacification may reflect asymmetric pulmonary edema or pneumonia. Underlying aspiration cannot be excluded, though the pattern is not particularly indicative of aspiration. No significant pleural effusion or pneumothorax is seen. The cardiomediastinal silhouette is borderline normal in size. No acute osseous abnormalities are identified. IMPRESSION: Diffuse right-sided airspace opacification may reflect asymmetric pulmonary edema or pneumonia. Underlying aspiration cannot be excluded, given clinical concern, though the pattern is not particularly indicative of aspiration. Electronically Signed   By: Garald Balding M.D.   On: 05/30/2018 05:37   Dg Chest Port 1v Same Day  Result Date: 06/09/2018 CLINICAL DATA:  Wheezing short of breath EXAM: PORTABLE CHEST 1 VIEW COMPARISON:  06/04/2018 FINDINGS: Endotracheal tube removed Cardiac enlargement with mild vascular congestion. Negative for heart failure or edema. Negative for effusion  Chronic pleural thickening and scarring left upper lobe. IMPRESSION: Cardiac enlargement with vascular congestion. Electronically Signed   By: Franchot Gallo M.D.   On: 06/09/2018 16:36   Dg Abd Portable 1v  Result Date: 06/05/2018 CLINICAL DATA:  OG tube placement. EXAM: PORTABLE ABDOMEN - 1 VIEW COMPARISON:  CT 05/29/2018 FINDINGS: Tip and side port of the enteric tube below the diaphragm in the stomach. Nonobstructive bowel gas pattern in the upper abdomen. Soft tissue attenuation  from habitus limits detailed assessment. IMPRESSION: Tip and side port of the enteric tube below the diaphragm in the stomach. Electronically Signed   By: Keith Rake M.D.   On: 06/05/2018 03:48   Vas US Carotid (at [facility] Only)  Result Date: 06/01/2018 Carotid Arterial Duplex Study Indications:       CVA. Risk Factors:      Hypertension. Other Factors:     Heart failure. Limitations:       patient has labored breathing; short neck; line on site;                    stiffness. Comparison Study:  No comparison study available Performing Technologist: Rudell Cobb  Examination Guidelines: A complete evaluation includes B-mode imaging, spectral Doppler, color Doppler, and power Doppler as needed of all accessible portions of each vessel. Bilateral testing is considered an integral part of a complete examination. Limited examinations for reoccurring indications may be performed as noted.  Right Carotid Findings: +----------+-------+--------+--------+--------------------------------+--------+           PSV    EDV cm/sStenosisDescribe                        Comments           cm/s                                                            +----------+-------+--------+--------+--------------------------------+--------+ CCA Prox  152    31                                              tortuous +----------+-------+--------+--------+--------------------------------+--------+ CCA Distal69     24                                                       +----------+-------+--------+--------+--------------------------------+--------+ ICA Prox  70     25      1-39%   diffuse, hyperechoic and                                                  irregular                                +----------+-------+--------+--------+--------------------------------+--------+ ICA Mid   89     38                                                        +----------+-------+--------+--------+--------------------------------+--------+  ICA Distal134    42                                                       +----------+-------+--------+--------+--------------------------------+--------+ ECA       68     15                                                       +----------+-------+--------+--------+--------------------------------+--------+ +----------+--------+-------+--------+-------------------+           PSV cm/sEDV cmsDescribeArm Pressure (mmHG) +----------+--------+-------+--------+-------------------+ [id number]                                         +----------+--------+-------+--------+-------------------+ +---------+--------+---+--------+--+---------+ VertebralPSV cm/s101EDV cm/s38Antegrade +---------+--------+---+--------+--+---------+  Left Carotid Findings: +----------+--------+--------+--------+------------------+--------+           PSV cm/sEDV cm/sStenosisDescribe          Comments +----------+--------+--------+--------+------------------+--------+ CCA Prox  101     32                                         +----------+--------+--------+--------+------------------+--------+ CCA Distal93      20                                         +----------+--------+--------+--------+------------------+--------+ ICA Prox  84      24      1-39%   focal and calcific         +----------+--------+--------+--------+------------------+--------+ ICA Mid   102     37                                         +----------+--------+--------+--------+------------------+--------+ ICA Distal125     52                                         +----------+--------+--------+--------+------------------+--------+ ECA       93                                                 +----------+--------+--------+--------+------------------+--------+ +----------+--------+--------+--------+-------------------+  SubclavianPSV cm/sEDV cm/sDescribeArm Pressure (mmHG) +----------+--------+--------+--------+-------------------+           102                                         +----------+--------+--------+--------+-------------------+ +---------+--------+---+--------+--+---------+ VertebralPSV cm/s100EDV cm/s37Antegrade +---------+--------+---+--------+--+---------+ IJV extendedly enlarged.  Summary: Right Carotid: Velocities in the right ICA are consistent with a 1-39% stenosis. Left Carotid: Velocities in the left ICA are consistent with  a 1-39% stenosis.               Left IJV extendedly enlarged. Vertebrals: Bilateral vertebral arteries demonstrate antegrade flow. *See table(s) above for measurements and observations.  Electronically signed by Curt Jews MD on 06/01/2018 at 5:41:51 PM.    Final    Vas [place] Lower Extremity Venous (dvt)  Result Date: 06/01/2018  Lower Venous Study Indications: Embolic stroke.  Limitations: Body habitus, line and Immobility. Comparison Study: No prior study available Performing Technologist: Rudell Cobb  Examination Guidelines: A complete evaluation includes B-mode imaging, spectral Doppler, color Doppler, and power Doppler as needed of all accessible portions of each vessel. Bilateral testing is considered an integral part of a complete examination. Limited examinations for reoccurring indications may be performed as noted.  Right Venous Findings: +---------+---------------+---------+-----------+----------+-------------------+          CompressibilityPhasicitySpontaneityPropertiesSummary             +---------+---------------+---------+-----------+----------+-------------------+ CFV      Full           Yes      Yes                                      +---------+---------------+---------+-----------+----------+-------------------+ SFJ      Full                                                              +---------+---------------+---------+-----------+----------+-------------------+ FV Prox  Full                                                             +---------+---------------+---------+-----------+----------+-------------------+ FV Mid   Full                                                             +---------+---------------+---------+-----------+----------+-------------------+ FV Distal                        Yes                  Unable to visualiz                                                        with compression    +---------+---------------+---------+-----------+----------+-------------------+ PFV      Full                                                             +---------+---------------+---------+-----------+----------+-------------------+ POP  Full           Yes      Yes                                      +---------+---------------+---------+-----------+----------+-------------------+ PTV      Full                                                             +---------+---------------+---------+-----------+----------+-------------------+ PERO     Full                                                             +---------+---------------+---------+-----------+----------+-------------------+  Left Venous Findings: +---------+---------------+---------+-----------+----------+-------------------+          CompressibilityPhasicitySpontaneityPropertiesSummary             +---------+---------------+---------+-----------+----------+-------------------+ CFV      Full           Yes      Yes                                      +---------+---------------+---------+-----------+----------+-------------------+ SFJ      Full                                                             +---------+---------------+---------+-----------+----------+-------------------+ FV Prox  Full                                                              +---------+---------------+---------+-----------+----------+-------------------+ FV Mid   Full                                                             +---------+---------------+---------+-----------+----------+-------------------+ FV DistalFull                                         Unable to visualiz                                                        with compression    +---------+---------------+---------+-----------+----------+-------------------+ PFV      Full                                                             +---------+---------------+---------+-----------+----------+-------------------+  POP      Full           Yes      Yes                                      +---------+---------------+---------+-----------+----------+-------------------+ PTV      Full                                                             +---------+---------------+---------+-----------+----------+-------------------+ PERO     Full                                                             +---------+---------------+---------+-----------+----------+-------------------+ Prominent lymph node see at groin area.    Summary: Right: There is no evidence of deep vein thrombosis in the lower extremity. However, portions of this examination were limited- see technologist comments above. No cystic structure found in the popliteal fossa. Left: There is no evidence of deep vein thrombosis in the lower extremity. However, portions of this examination were limited- see technologist comments above. No cystic structure found in the popliteal fossa.  *See table(s) above for measurements and observations. Electronically signed by Curt Jews MD on 06/01/2018 at 5:41:34 PM.    Final    Ct Head Wo Contrast (charm Study)  Result Date: 06/05/2018 CLINICAL DATA:  Encephalopathy. Recent right MCA infarct. EXAM: CT HEAD WITHOUT CONTRAST TECHNIQUE: Contiguous axial images were  obtained from the base of the skull through the vertex without intravenous contrast. COMPARISON:  Most recent head CT yesterday at 1148 hour, brain MRI 06/01/2018 FINDINGS: Brain: Progressive cortical low-density in the right parietooccipital lobe at site of infarct on MRI. Additional developing low-density in the posterior right frontal and right parietal lobes at site of infarcts on MRI. No evidence of hemorrhagic conversion or suspicious mass effect. Remote lacunar infarct in the right caudate. No hydrocephalus or midline shift. No evidence of acute ischemia, mild motion artifact limitations. Vascular: Atherosclerosis of skullbase vasculature without hyperdense vessel or abnormal calcification. Skull: No fracture or focal lesion. Sinuses/Orbits: No opacification of ethmoid air cells with mucosal thickening may be secondary to intubation. Small left mastoid effusion, unchanged. Other: None. IMPRESSION: 1. Expected evolution of recent parietooccipital and frontoparietal infarcts seen on MRI. No hemorrhagic conversion or significant mass effect. 2. No new abnormality. Electronically Signed   By: Keith Rake M.D.   On: 06/05/2018 01:31       Subjective: Patient seen and examined the bedside this morning.  Hemodynamically stable.  Respiratory status stable.  Does not have any significant peripheral edema.  Auscultated to have mild expiratory wheezes. stable for discharge today to home.  Discharge Exam: Vitals:   06/28/18 0456 06/28/18 0810  BP: 122/64   Pulse: 71 86  Resp: 18 18  Temp: 98 F (36.7 C)   SpO2: 90% 96%   Vitals:   06/27/18 2334 06/28/18 0256 06/28/18 0456 06/28/18 0810  BP:   122/64   Pulse:   71 86  Resp:   18  18  Temp:   98 F (36.7 C)   TempSrc:      SpO2: 96% 98% 90% 96%  Weight:   (!) 137.8 kg   Height:        General: Pt is alert, awake, not in acute distress,obese Cardiovascular: RRR, S1/S2 +, no rubs, no gallops Respiratory: Bilateral decreased air entry,  mild expiratory wheezes Abdominal: Soft, NT, ND, bowel sounds + Extremities: no edema, no cyanosis    The results of significant diagnostics from this hospitalization (including imaging, microbiology, ancillary and laboratory) are listed below for reference.     Microbiology: Recent Results (from the past 240 hour(s))  Urine culture     Status: Abnormal   Collection Time: 06/24/18  7:45 PM  Result Value Ref Range Status   Specimen Description URINE, CLEAN CATCH  Final   Special Requests   Final    NONE Performed at [facility], [address], [place], [place] [address]    Culture MULTIPLE SPECIES PRESENT, SUGGEST RECOLLECTION (A)  Final   Report Status 06/26/2018 FINAL  Final     Labs: BNP (last 3 results) Recent Labs    01/23/18 1521 05/29/18 1052 06/24/18 2039  BNP 617.6* 1,218.8* 951.8*   Basic Metabolic Panel: Recent Labs  Lab 06/24/18 2039 06/25/18 0733 06/26/18 0416 06/27/18 0400 06/28/18 0500  NA 136 137 136 135 138  K 3.5 3.6 3.1* 3.6 3.7  CL 94* 93* 90* 89* 93*  CO2 38* 35* 36* 35* 37*  GLUCOSE 224* 175* 116* 109* 86  BUN 12 9 10 9 10   CREATININE 1.04* 1.04* 1.10* 1.04* 1.09*  CALCIUM 8.6* 8.6* 8.5* 8.6* 8.6*  MG 1.7  --   --   --   --    Liver Function Tests: Recent Labs  Lab 06/24/18 2039  AST 26  ALT 15  ALKPHOS 111  BILITOT 1.2  PROT 8.6*  ALBUMIN 2.4*   No results for input(s): LIPASE, AMYLASE in the last 168 hours. No results for input(s): AMMONIA in the last 168 hours. CBC: Recent Labs  Lab 06/24/18 2039 06/25/18 0733 06/25/18 2045 06/26/18 0416 06/27/18 0400 06/28/18 0500  WBC 7.7 6.7  --  6.8 7.4 6.8  NEUTROABS 5.2  --   --   --   --   --   HGB 6.9* 7.4* 8.3* 8.3* 8.1* 8.2*  HCT 26.1* 26.9* 29.4* 30.7* 29.4* 29.4*  MCV 90.0 87.1  --  88.5 88.8 90.2  PLT 264 245  --  238 252 252   Cardiac Enzymes: No results for input(s): CKTOTAL, CKMB, CKMBINDEX, TROPONINI in the last 168 hours. BNP: Invalid input(s):  POCBNP CBG: Recent Labs  Lab 06/27/18 0737 06/27/18 1126 06/27/18 1622 06/27/18 2143 06/28/18 0751  GLUCAP 196* 165* 145* 160* 90   D-Dimer No results for input(s): DDIMER in the last 72 hours. Hgb A1c No results for input(s): HGBA1C in the last 72 hours. Lipid Profile No results for input(s): CHOL, HDL, LDLCALC, TRIG, CHOLHDL, LDLDIRECT in the last 72 hours. Thyroid function studies No results for input(s): TSH, T4TOTAL, T3FREE, THYROIDAB in the last 72 hours.  Invalid input(s): FREET3 Anemia work up No results for input(s): VITAMINB12, FOLATE, FERRITIN, TIBC, IRON, RETICCTPCT in the last 72 hours. Urinalysis    Component Value Date/Time   COLORURINE YELLOW 06/24/2018 1937   APPEARANCEUR HAZY (A) 06/24/2018 1937   LABSPEC 1.017 06/24/2018 1937   PHURINE 7.0 06/24/2018 1937   GLUCOSEU NEGATIVE 06/24/2018 1937   HGBUR  NEGATIVE 06/24/2018 1937   BILIRUBINUR NEGATIVE 06/24/2018 1937   KETONESUR NEGATIVE 06/24/2018 1937   PROTEINUR 100 (A) 06/24/2018 1937   UROBILINOGEN 2.0 (H) 02/21/2015 1914   NITRITE NEGATIVE 06/24/2018 1937   LEUKOCYTESUR NEGATIVE 06/24/2018 1937   Sepsis Labs Invalid input(s): PROCALCITONIN,  WBC,  LACTICIDVEN Microbiology Recent Results (from the past 240 hour(s))  Urine culture     Status: Abnormal   Collection Time: 06/24/18  7:45 PM  Result Value Ref Range Status   Specimen Description URINE, CLEAN CATCH  Final   Special Requests   Final    NONE Performed at [facility], [place] [address], [place], [place] [address]    Culture MULTIPLE SPECIES PRESENT, SUGGEST RECOLLECTION (A)  Final   Report Status 06/26/2018 FINAL  Final    Please note: You were cared for by a hospitalist during your hospital stay. Once you are discharged, your primary care physician will handle any further medical issues. Please note that NO REFILLS for any discharge medications will be authorized once you are discharged, as it is imperative that you return to your  primary care physician (or establish a relationship with a primary care physician if you do not have one) for your post hospital discharge needs so that they can reassess your need for medications and monitor your lab values.    Time coordinating discharge: 40 minutes  SIGNED:   Shelly Coss, MD  [facility] 06/28/2018, 8:52 AM Pager [phone]  If 7PM-7AM, please contact night-coverage [email or web address] Password [email or web address]

## 2018-06-28 NOTE — Progress Notes (Signed)
I responded to a request from the nurse to provide spiritual support for the patient. The patient requested a visit from the [facility]. I visited the patient's room and provided spiritual support through pastoral presence, words of encouragement, and prayer.    06/28/18 1000  Clinical Encounter Type  Visited With Patient  Visit Type Follow-up;Spiritual support  Referral From Nurse  Consult/Referral To Chaplain  Spiritual Encounters  Spiritual Needs Prayer    Chaplain Dr Redgie Grayer

## 2018-06-28 NOTE — Progress Notes (Signed)
Patient is wet, she is changed and repositioned.

## 2018-06-28 NOTE — Progress Notes (Signed)
Patient called for PRN nebulizer.  Patient currently on 3 lpm [place].  Placed back on BIPAP. Weaned Fio2 to 2L.

## 2018-06-28 NOTE — Progress Notes (Signed)
CM and Jermaine with [facility] talked to patient at the bedside, all questions answered; [place] arranged with [facility]; DME - [employer] noninvasive vent will be delivered to the home when patient is discharged; They will talk to the patient at home with her daughter, set up the Trilogy with instructions of usage; wheelchair delivered to the patient's room; B Pennie Rushing [phone]

## 2018-06-28 NOTE — Progress Notes (Signed)
Placed patient on BIPAP previous settings 18/5 with 3 lpm O2 bleed in.

## 2018-06-29 ENCOUNTER — Telehealth: Payer: Self-pay

## 2018-06-29 NOTE — Telephone Encounter (Signed)
Transition Care Management Follow-up Telephone Call Date of discharge and from where: 06/28/2018, [facility]  Call placed to # [phone], message left requesting a call back to this CM # [phone].

## 2018-06-30 ENCOUNTER — Telehealth: Payer: Self-pay

## 2018-06-30 NOTE — Telephone Encounter (Signed)
Transition Care Management Follow-up Telephone Call Date of discharge and from where: 06/28/2018   [facility].  Call placed to patient.  Her boyfriend answered and stated that she was not able to talk at this time because she was using her CPAP. Instructed him to have her call this CM when she is able.  He stated that she has the call back # to the clinic.  No DPR on file to speak to her boyfriend

## 2018-07-03 ENCOUNTER — Telehealth (HOSPITAL_COMMUNITY): Payer: Self-pay

## 2018-07-03 ENCOUNTER — Inpatient Hospital Stay: Payer: Self-pay | Admitting: Acute Care

## 2018-07-03 ENCOUNTER — Telehealth: Payer: Self-pay

## 2018-07-03 NOTE — Telephone Encounter (Signed)
Spoke with Tamara Silva about initial home visit. Home visit confirmed for 07/10/2018. Will call closer to day to confirm time.

## 2018-07-03 NOTE — Telephone Encounter (Signed)
Transition Care Management Follow-up Telephone Call  Date of discharge and from where: 06/28/2018 [facility]  How have you been since you were released from the hospital?  She said she is feeling " good."   Any questions or concerns? No questions/concerns reported  Items Reviewed:  Did the pt receive and understand the discharge instructions provided? She said that she has the discharge instructions and does not have any questions.   Medications obtained and verified? She said that she has her medications and has no questions and did not want to review the list. Reminded her that she has new medications on the list that she needs to start taking. She still did not want to review the list. She did say that she takes her insulin at night but did not acknowledge that it is levemir.  She called it the " green medicine" that is an injection. She takes 18 units at night. This CM asked Heather, EMT to review all medications in the home.   Any new allergies?  None reported   Do you have support at home? Yes, she said that she has a roommate and her boyfriend  Other (ie: DME, Home Health, etc) she has a glucometer but needs new batteries. She also said that she may need a new nebulizer.   She reported that her blood sugar this morning was 170 but it has been as high as 462 when she takes the steroids.   She also has the BiPAP and has been using that.   She already has O2 at home from from [facility] and stated that she has been using it at 4L/min.  She noted that she has not heard from [facility].  This CM called [facility] and spoke to McDade who said that the patient is scheduled to see the nurse tomorrow and PT on 07/05/2018.   Call placed to [facility], EMT and requested that she review all of patient's medications on the home visit which is scheduled for 07/10/2018.   Functional Questionnaire: (I = Independent and D = Dependent) ADL's: caregiver assists when needed. Patient has a wheelchair  to use as needed    Follow up appointments reviewed:    PCP Hospital f/u appt confirmed? 07/05/2018 @ 1050 with [facility] f/u appt confirmed? Pulmonary appointment was today. Will need to reschedule   Are transportation arrangements needed? She said that she will need a cab if her daughter is not able to bring her to the clinic. She said that she will call the clinic tomorrow  if a ride is needed  If their condition worsens, is the pt aware to call  their PCP or go to the ED? yes  Was the patient provided with contact information for the PCP's office or ED? Yes  She has the phone number for the clinic  Was the pt encouraged to call back with questions or concerns?yes

## 2018-07-04 ENCOUNTER — Telehealth: Payer: Self-pay | Admitting: Family Medicine

## 2018-07-04 NOTE — Telephone Encounter (Signed)
Tamara Silva called to confirm that home health visits  will begin on feb.  6th.

## 2018-07-05 ENCOUNTER — Ambulatory Visit: Payer: Medicaid Other | Attending: Family Medicine | Admitting: Family Medicine

## 2018-07-05 ENCOUNTER — Encounter: Payer: Self-pay | Admitting: Family Medicine

## 2018-07-05 VITALS — BP 124/78 | HR 87 | Temp 98.9°F | Resp 18 | Ht 67.5 in | Wt 301.0 lb

## 2018-07-05 DIAGNOSIS — E1165 Type 2 diabetes mellitus with hyperglycemia: Secondary | ICD-10-CM

## 2018-07-05 DIAGNOSIS — N182 Chronic kidney disease, stage 2 (mild): Secondary | ICD-10-CM

## 2018-07-05 DIAGNOSIS — J9611 Chronic respiratory failure with hypoxia: Secondary | ICD-10-CM | POA: Diagnosis not present

## 2018-07-05 DIAGNOSIS — D509 Iron deficiency anemia, unspecified: Secondary | ICD-10-CM

## 2018-07-05 DIAGNOSIS — E1122 Type 2 diabetes mellitus with diabetic chronic kidney disease: Secondary | ICD-10-CM | POA: Diagnosis not present

## 2018-07-05 DIAGNOSIS — Z9981 Dependence on supplemental oxygen: Secondary | ICD-10-CM

## 2018-07-05 DIAGNOSIS — Z09 Encounter for follow-up examination after completed treatment for conditions other than malignant neoplasm: Secondary | ICD-10-CM

## 2018-07-05 DIAGNOSIS — I25119 Atherosclerotic heart disease of native coronary artery with unspecified angina pectoris: Secondary | ICD-10-CM

## 2018-07-05 DIAGNOSIS — Z794 Long term (current) use of insulin: Secondary | ICD-10-CM | POA: Diagnosis not present

## 2018-07-05 DIAGNOSIS — I5042 Chronic combined systolic (congestive) and diastolic (congestive) heart failure: Secondary | ICD-10-CM | POA: Diagnosis not present

## 2018-07-05 DIAGNOSIS — I10 Essential (primary) hypertension: Secondary | ICD-10-CM

## 2018-07-05 DIAGNOSIS — IMO0002 Reserved for concepts with insufficient information to code with codable children: Secondary | ICD-10-CM

## 2018-07-05 LAB — GLUCOSE, POCT (MANUAL RESULT ENTRY): POC Glucose: 112 mg/dL — AB (ref 70–99)

## 2018-07-05 NOTE — Progress Notes (Signed)
Subjective:    Patient ID: Tamara Silva, female    DOB: 12/20/1960, 58 y.o.   MRN: [id number]  HPI       58 year old female last seen in the office 01/23/2018 who presents for follow-up of recent hospitalizations.  Patient was most recently admitted to the hospital on 06/25/2027 after admission for acute on chronic heart failure exacerbation due to fluid overload which improved with diuresis.  Patient with history of obstructive sleep apnea requiring BiPAP, COPD, chronic hypoxic respiratory failure with need for 4 L of oxygen, iron deficiency anemia and patient had hospital admission from 05/29/2018 to 06/09/2018 with acute anemia with hemoglobin of 5.7 requiring transfusion and also at that time had a right MCA/PCA watershed stroke and 2 cm infarct in the inferior right parietal lobe with respiratory failure and required intubation after onset of tonic-clonic seizure.      She reports that she feels better than she did during her recent hospitalization.  She still has some shortness of breath as well as swelling in her legs.  Patient reports that she took extra Lasix to help with the swelling.  Patient is not weighing herself at home.  Patient believes that she is taking her medications as prescribed at hospital discharge.  She reports that she is using her CPAP when sleeping.  Patient states that she has been told that she will receive a BiPAP as suggested during her hospitalization.  Patient has been using recommended 4 L of oxygen continuously.  Patient denies any current chest pain related to her history of heart disease.  Patient did have CVA during hospitalization but denies any issues with swallowing and does not have any significant focal weakness.  Patient continues to have issues with generalized weakness and mostly stays in a wheelchair or recliner at home.  Patient is taking medications prescribed at hospital discharge to help with her blood pressure.  Patient does not really like checking her  blood sugars at home but she is taking her medications for diabetes on a daily basis.  Patient does not feel that she is having any increased urinary frequency other than urinary frequency caused by her use of Lasix.  Patient states that she at times does not feel as if the dose of Lasix prescribed at hospital discharge is working and therefore she takes additional Lasix.  Past Medical History:  Diagnosis Date  . Asthma   . CAD (coronary artery disease)   . Chronic combined systolic (congestive) and diastolic (congestive) heart failure ([place])   . Chronic iron deficiency anemia   . Diabetes mellitus without complication ([place])   . DOE (dyspnea on exertion)   . Edema of both legs   . Hepatic steatosis   . Hypertension   . Pulmonary emboli ([place]) 2014   Past Surgical History:  Procedure Laterality Date  . BIOPSY  07/10/2018   Procedure: BIOPSY;  Surgeon: [name], MD;  Location: [place];  Service: Gastroenterology;;  . COLONOSCOPY WITH PROPOFOL N/A 08/03/2013   Procedure: COLONOSCOPY WITH PROPOFOL;  Surgeon: Jeryl Columbia, MD;  Location: [facility];  Service: Endoscopy;  Laterality: N/A;  . COLONOSCOPY WITH PROPOFOL N/A 07/10/2018   Procedure: COLONOSCOPY WITH PROPOFOL;  Surgeon: [name], MD;  Location: [facility];  Service: Gastroenterology;  Laterality: N/A;  . CORONARY ANGIOPLASTY WITH STENT PLACEMENT     CAD in 2006 x 2 and 2009 2 more- place din REx in [place] and [facility]  . CORONARY ANGIOPLASTY WITH STENT PLACEMENT  10/07/2013  Xience [place] DES 2.75  mm x 15  mm  . ESOPHAGOGASTRODUODENOSCOPY (EGD) WITH PROPOFOL N/A 08/03/2013   Procedure: ESOPHAGOGASTRODUODENOSCOPY (EGD) WITH PROPOFOL;  Surgeon: Jeryl Columbia, MD;  Location: [facility];  Service: Endoscopy;  Laterality: N/A;  . ESOPHAGOGASTRODUODENOSCOPY (EGD) WITH PROPOFOL N/A 07/10/2018   Procedure: ESOPHAGOGASTRODUODENOSCOPY (EGD) WITH PROPOFOL;  Surgeon: [name], MD;  Location: [place];  Service:  Gastroenterology;  Laterality: N/A;  . LEFT HEART CATHETERIZATION WITH CORONARY ANGIOGRAM N/A 10/07/2013   Procedure: LEFT HEART CATHETERIZATION WITH CORONARY ANGIOGRAM;  Surgeon: Leonie Man, MD;  Location: [facility];  Service: Cardiovascular;  Laterality: N/A;   Family History  Problem Relation Age of Onset  . Stroke Mother   . Hypertension Mother   . Colon cancer Father   . Diabetes Father   . Heart attack Father   . Sarcoidosis Other   . Breast cancer Paternal Aunt   . Emphysema Brother   . Lung disease Brother        Unknown type, 3 brothers, one with liver and lung disease  . Asthma Paternal Aunt    Social History   Tobacco Use  . Smoking status: Former Smoker    Packs/day: 0.50    Years: 20.00    Pack years: 10.00    Types: Cigarettes    Last attempt to quit: 04/30/2014    Years since quitting: 4.2  . Smokeless tobacco: Never Used  . Tobacco comment: smoked off and on x 20 years  Substance Use Topics  . Alcohol use: No    Alcohol/week: 0.0 standard drinks  . Drug use: No   Allergies  Allergen Reactions  . Metolazone Other (See Comments)    Dizziness and falling  . Plavix [Clopidogrel Bisulfate] Other (See Comments)    High PRU's - non-responder  . Ibuprofen Other (See Comments)    Pt states she is not supposed to take ibuprofen because of other meds she is taking  . Nsaids Other (See Comments)    "I do not take NSAIDs d/t interfering with other meds"      Review of Systems  Constitutional: Positive for fatigue. Negative for chills and fever.  HENT: Negative for sore throat and trouble swallowing.   Respiratory: Positive for shortness of breath. Negative for cough.   Cardiovascular: Positive for leg swelling. Negative for chest pain and palpitations.  Gastrointestinal: Negative for abdominal pain, blood in stool, constipation, diarrhea, nausea and vomiting.  Genitourinary: Positive for dysuria and frequency.  Musculoskeletal: Positive for arthralgias,  back pain and gait problem.  Neurological: Negative for dizziness and headaches.  Hematological: Negative for adenopathy. Does not bruise/bleed easily.       Objective:   Physical Exam BP 124/78 (BP Location: Left Arm, Patient Position: Sitting, Cuff Size: Large)   Pulse 87   Temp 98.9 F (37.2 C) (Oral)   Resp 18   Ht 5' 7.5" (1.715 m)   Wt (!) 301 lb (136.5 kg)   LMP 07/28/2013 Comment: perimenopausal  SpO2 99%   BMI 46.45 kg/m Nurse's notes and vital signs reviewed   General- obese older female in no acute distress using oxygen by nasal cannula, sitting in a wheelchair.  Patient is accompanied by female friend at today's visit.  Patient repeatedly passes gas loudly during her visit. Neck-supple, no lymphadenopathy, no JVD Lungs-clear to auscultation bilaterally, breathing is unlabored and there is no increased work of breathing Cardiovascular-regular rate and rhythm Abdomen-truncal obesity, normal bowel sounds, soft and nontender Extremities-patient with  1+ pitting to mid shin bilaterally Feet-no active skin breakdown on the feet but patient does have thickened skin on the heels but no actual breakdown of the skin of the feet Back-no CVA tenderness, patient with complaint of lumbosacral tenderness to palpation  Psych- patient is jovial, not sure that she seems to take her medical conditions seriously        Assessment & Plan:  1. Uncontrolled type 2 diabetes mellitus with stage 2 chronic kidney disease, with long-term current use of insulin ([place]) Patient with improvement in control of her blood sugars with most recent hemoglobin A1c being 7.1 on 06/01/2018 which was decreased from A1c of 8.0 on 01/23/2018.  And patient with hemoglobin A1c 10/06/2016 of 10.0. - Glucose (CBG)  2. Chronic combined systolic and diastolic congestive heart failure, NYHA class 2; EF 30-35% Patient with recent acute exacerbation of CHF on top of chronic combined systolic and diastolic congestive heart  failure.  Patient continues to have issues with peripheral edema and shortness of breath.  Patient was encouraged to start weighing herself at home on a daily basis as it is important for her to report any weight gain and especially if she gains more than 3 pounds in a 24-hour timeframe.  Patient states that she recalls being told something similar by her home health nurse.  Patient does have a home scale but has not started weighing herself.  Patient states that she is adjusting her Lasix according to how much she thinks that she needs based on her swelling.  Patient has upcoming appointment with cardiology in 2 days and patient was told that she should find out how much Lasix and/or potassium she will need to take to control her CHF and patient should find out more information about her CHF from her cardiologist.  Patient was discharged from the hospital on January 23, 20 but due to continued shortness of breath despite self increasing her home oxygen to 6 L at night, patient had to be treated in the emergency department where she was started on aggressive diuresis on 06/25/2018 for fluid overload.  Patient is encouraged to check her weight daily and to keep a record of her weight and call her cardiologist or this office if she is having issues with weight gain/increased peripheral edema or shortness of breath. - Basic Metabolic Panel  3. Chronic respiratory failure with hypoxia, on home O2 therapy [facility]) Patient is to make sure that she continues to use her home oxygen continuously due to diagnosis of chronic respiratory failure.  Patient with history during recent hospitalization of acute on chronic respiratory failure requiring intubation.  Patient is also encouraged to actually follow-up with pulmonology regarding her obstructive sleep apnea and chronic respiratory failure as patient has had issues in the past with poor compliance with medical follow-up.  Patient also with obesity hypoventilation  syndrome  4. Iron deficiency anemia, unspecified iron deficiency anemia type Patient with iron deficiency anemia during her recent hospitalization and her hemoglobin had dropped as low as 5.7 at the time of admission and patient was given transfusion during her hospitalization.  Patient reports that she does feel better than she did prior to her hospitalization. - CBC with Differential  5. Essential hypertension Patient is to continue her use of carvedilol, Lasix and potassium prescribed for hypertension/heart failure  6. Coronary artery disease involving native coronary artery of native heart with angina pectoris [facility]) Patient is to continue use of Crestor 40 mg, daily aspirin and continue to control  blood pressure and blood sugars due to her history of coronary artery disease  7. Hospital discharge follow-up Patient appears to be stable at today's visit however she does have some peripheral edema and has not been weighing herself at home and has been self adjusting her Lasix.  Patient is to follow-up with cardiology.  Patient will have BMP in follow-up of renal function and electrolytes at today's visit.  Patient additionally will have CBC in follow-up of anemia status post transfusion of 2 units of packed red blood cells patient is aware that she needs to follow-up additionally with pulmonology status post hospital discharge.  Arrangements are also being made for patient to obtain BiPAP to replace current CPAP due to her respiratory failure.  Patient also status post CVA during recent hospitalization.  Patient did have positive UDS for cocaine reminded that this medication can cause constriction of blood vessels leading to issues with heart perfusion, heart rhythm and can also cause recurrence of stroke  An After Visit Summary was printed and given to the patient.  Return in about 3 months (around 10/03/2018) for DM/CHF.

## 2018-07-06 ENCOUNTER — Telehealth: Payer: Self-pay | Admitting: Family Medicine

## 2018-07-06 LAB — BASIC METABOLIC PANEL WITH GFR
BUN/Creatinine Ratio: 16 (ref 9–23)
BUN: 26 mg/dL — ABNORMAL HIGH (ref 6–24)
CO2: 29 mmol/L (ref 20–29)
Calcium: 8.4 mg/dL — ABNORMAL LOW (ref 8.7–10.2)
Chloride: 100 mmol/L (ref 96–106)
Creatinine, Ser: 1.6 mg/dL — ABNORMAL HIGH (ref 0.57–1.00)
GFR calc Af Amer: 41 mL/min/1.73 — ABNORMAL LOW
GFR calc non Af Amer: 35 mL/min/1.73 — ABNORMAL LOW
Glucose: 91 mg/dL (ref 65–99)
Potassium: 3.8 mmol/L (ref 3.5–5.2)
Sodium: 142 mmol/L (ref 134–144)

## 2018-07-06 LAB — CBC WITH DIFFERENTIAL/PLATELET
Basophils Absolute: 0.1 x10E3/uL (ref 0.0–0.2)
Basos: 1 %
EOS (ABSOLUTE): 0.1 x10E3/uL (ref 0.0–0.4)
Eos: 2 %
Hematocrit: 27.1 % — ABNORMAL LOW (ref 34.0–46.6)
Hemoglobin: 7.9 g/dL — ABNORMAL LOW (ref 11.1–15.9)
Immature Grans (Abs): 0.1 x10E3/uL (ref 0.0–0.1)
Immature Granulocytes: 1 %
Lymphocytes Absolute: 1.4 x10E3/uL (ref 0.7–3.1)
Lymphs: 14 %
MCH: 25.6 pg — ABNORMAL LOW (ref 26.6–33.0)
MCHC: 29.2 g/dL — ABNORMAL LOW (ref 31.5–35.7)
MCV: 88 fL (ref 79–97)
Monocytes Absolute: 1 x10E3/uL — ABNORMAL HIGH (ref 0.1–0.9)
Monocytes: 10 %
NRBC: 1 % — ABNORMAL HIGH (ref 0–0)
Neutrophils Absolute: 7 x10E3/uL (ref 1.4–7.0)
Neutrophils: 72 %
Platelets: 394 x10E3/uL (ref 150–450)
RBC: 3.09 x10E6/uL — ABNORMAL LOW (ref 3.77–5.28)
RDW: 22 % — ABNORMAL HIGH (ref 11.7–15.4)
WBC: 9.6 x10E3/uL (ref 3.4–10.8)

## 2018-07-06 NOTE — Telephone Encounter (Signed)
[facility] called to inform provider they will not go to pt home today as planned, they will go to pt home tomorrow 07/07/18 instead.

## 2018-07-07 ENCOUNTER — Encounter (HOSPITAL_COMMUNITY): Payer: Self-pay

## 2018-07-07 ENCOUNTER — Other Ambulatory Visit: Payer: Self-pay | Admitting: Family Medicine

## 2018-07-07 ENCOUNTER — Other Ambulatory Visit (HOSPITAL_COMMUNITY): Payer: Self-pay | Admitting: Internal Medicine

## 2018-07-07 ENCOUNTER — Other Ambulatory Visit (HOSPITAL_COMMUNITY): Payer: Self-pay | Admitting: Cardiology

## 2018-07-07 DIAGNOSIS — E1165 Type 2 diabetes mellitus with hyperglycemia: Secondary | ICD-10-CM

## 2018-07-07 DIAGNOSIS — Z794 Long term (current) use of insulin: Principal | ICD-10-CM

## 2018-07-07 DIAGNOSIS — N182 Chronic kidney disease, stage 2 (mild): Principal | ICD-10-CM

## 2018-07-07 DIAGNOSIS — IMO0002 Reserved for concepts with insufficient information to code with codable children: Secondary | ICD-10-CM

## 2018-07-07 DIAGNOSIS — E1122 Type 2 diabetes mellitus with diabetic chronic kidney disease: Secondary | ICD-10-CM

## 2018-07-08 ENCOUNTER — Encounter (HOSPITAL_COMMUNITY): Payer: Self-pay

## 2018-07-08 ENCOUNTER — Emergency Department (HOSPITAL_COMMUNITY): Payer: Medicaid Other

## 2018-07-08 ENCOUNTER — Other Ambulatory Visit: Payer: Self-pay

## 2018-07-08 ENCOUNTER — Observation Stay (HOSPITAL_COMMUNITY)
Admission: EM | Admit: 2018-07-08 | Discharge: 2018-07-11 | Disposition: A | Discharge status: Home / Self Care | Payer: Medicaid Other | Attending: Internal Medicine | Admitting: Internal Medicine

## 2018-07-08 DIAGNOSIS — E662 Morbid (severe) obesity with alveolar hypoventilation: Secondary | ICD-10-CM | POA: Diagnosis not present

## 2018-07-08 DIAGNOSIS — K319 Disease of stomach and duodenum, unspecified: Secondary | ICD-10-CM | POA: Insufficient documentation

## 2018-07-08 DIAGNOSIS — E785 Hyperlipidemia, unspecified: Secondary | ICD-10-CM | POA: Insufficient documentation

## 2018-07-08 DIAGNOSIS — I2510 Atherosclerotic heart disease of native coronary artery without angina pectoris: Secondary | ICD-10-CM | POA: Insufficient documentation

## 2018-07-08 DIAGNOSIS — D61818 Other pancytopenia: Secondary | ICD-10-CM | POA: Diagnosis not present

## 2018-07-08 DIAGNOSIS — K5730 Diverticulosis of large intestine without perforation or abscess without bleeding: Secondary | ICD-10-CM | POA: Diagnosis not present

## 2018-07-08 DIAGNOSIS — J9611 Chronic respiratory failure with hypoxia: Secondary | ICD-10-CM

## 2018-07-08 DIAGNOSIS — E860 Dehydration: Secondary | ICD-10-CM | POA: Insufficient documentation

## 2018-07-08 DIAGNOSIS — D509 Iron deficiency anemia, unspecified: Principal | ICD-10-CM | POA: Insufficient documentation

## 2018-07-08 DIAGNOSIS — Z6841 Body Mass Index (BMI) 40.0 and over, adult: Secondary | ICD-10-CM | POA: Insufficient documentation

## 2018-07-08 DIAGNOSIS — Z79899 Other long term (current) drug therapy: Secondary | ICD-10-CM | POA: Insufficient documentation

## 2018-07-08 DIAGNOSIS — I10 Essential (primary) hypertension: Secondary | ICD-10-CM

## 2018-07-08 DIAGNOSIS — Z9981 Dependence on supplemental oxygen: Secondary | ICD-10-CM

## 2018-07-08 DIAGNOSIS — Z86711 Personal history of pulmonary embolism: Secondary | ICD-10-CM | POA: Insufficient documentation

## 2018-07-08 DIAGNOSIS — E119 Type 2 diabetes mellitus without complications: Secondary | ICD-10-CM | POA: Insufficient documentation

## 2018-07-08 DIAGNOSIS — Z8673 Personal history of transient ischemic attack (TIA), and cerebral infarction without residual deficits: Secondary | ICD-10-CM | POA: Insufficient documentation

## 2018-07-08 DIAGNOSIS — K219 Gastro-esophageal reflux disease without esophagitis: Secondary | ICD-10-CM | POA: Insufficient documentation

## 2018-07-08 DIAGNOSIS — N183 Chronic kidney disease, stage 3 unspecified: Secondary | ICD-10-CM | POA: Diagnosis present

## 2018-07-08 DIAGNOSIS — K2980 Duodenitis without bleeding: Secondary | ICD-10-CM | POA: Insufficient documentation

## 2018-07-08 DIAGNOSIS — Z794 Long term (current) use of insulin: Secondary | ICD-10-CM | POA: Diagnosis not present

## 2018-07-08 DIAGNOSIS — E1122 Type 2 diabetes mellitus with diabetic chronic kidney disease: Secondary | ICD-10-CM | POA: Insufficient documentation

## 2018-07-08 DIAGNOSIS — Z7982 Long term (current) use of aspirin: Secondary | ICD-10-CM | POA: Diagnosis not present

## 2018-07-08 DIAGNOSIS — R627 Adult failure to thrive: Secondary | ICD-10-CM | POA: Diagnosis not present

## 2018-07-08 DIAGNOSIS — N179 Acute kidney failure, unspecified: Secondary | ICD-10-CM

## 2018-07-08 DIAGNOSIS — R471 Dysarthria and anarthria: Secondary | ICD-10-CM

## 2018-07-08 DIAGNOSIS — I130 Hypertensive heart and chronic kidney disease with heart failure and stage 1 through stage 4 chronic kidney disease, or unspecified chronic kidney disease: Secondary | ICD-10-CM | POA: Diagnosis not present

## 2018-07-08 DIAGNOSIS — J449 Chronic obstructive pulmonary disease, unspecified: Secondary | ICD-10-CM | POA: Insufficient documentation

## 2018-07-08 DIAGNOSIS — D649 Anemia, unspecified: Secondary | ICD-10-CM | POA: Diagnosis not present

## 2018-07-08 DIAGNOSIS — Z87891 Personal history of nicotine dependence: Secondary | ICD-10-CM | POA: Insufficient documentation

## 2018-07-08 DIAGNOSIS — I5042 Chronic combined systolic (congestive) and diastolic (congestive) heart failure: Secondary | ICD-10-CM | POA: Diagnosis not present

## 2018-07-08 DIAGNOSIS — E1129 Type 2 diabetes mellitus with other diabetic kidney complication: Secondary | ICD-10-CM | POA: Diagnosis present

## 2018-07-08 DIAGNOSIS — Z9119 Patient's noncompliance with other medical treatment and regimen: Secondary | ICD-10-CM | POA: Insufficient documentation

## 2018-07-08 DIAGNOSIS — Z9989 Dependence on other enabling machines and devices: Secondary | ICD-10-CM

## 2018-07-08 DIAGNOSIS — Z955 Presence of coronary angioplasty implant and graft: Secondary | ICD-10-CM | POA: Insufficient documentation

## 2018-07-08 DIAGNOSIS — Z9861 Coronary angioplasty status: Secondary | ICD-10-CM

## 2018-07-08 LAB — BASIC METABOLIC PANEL
Anion gap: 12 (ref 5–15)
BUN: 12 mg/dL (ref 6–20)
CHLORIDE: 94 mmol/L — AB (ref 98–111)
CO2: 29 mmol/L (ref 22–32)
Calcium: 8.3 mg/dL — ABNORMAL LOW (ref 8.9–10.3)
Creatinine, Ser: 1.31 mg/dL — ABNORMAL HIGH (ref 0.44–1.00)
GFR calc Af Amer: 52 mL/min — ABNORMAL LOW (ref >60)
GFR calc non Af Amer: 45 mL/min — ABNORMAL LOW (ref >60)
Glucose, Bld: 195 mg/dL — ABNORMAL HIGH (ref 70–99)
Potassium: 3.7 mmol/L (ref 3.5–5.1)
Sodium: 135 mmol/L (ref 135–145)

## 2018-07-08 LAB — PROTIME-INR
INR: 1.2
Prothrombin Time: 15.1 seconds (ref 11.4–15.2)

## 2018-07-08 LAB — IRON AND TIBC
Iron: 37 ug/dL (ref 28–170)
Saturation Ratios: 11 % (ref 10.4–31.8)
TIBC: 336 ug/dL (ref 250–450)
UIBC: 299 ug/dL

## 2018-07-08 LAB — VITAMIN B12: Vitamin B-12: 733 pg/mL (ref 180–914)

## 2018-07-08 LAB — RETICULOCYTES
Immature Retic Fract: 26.2 % — ABNORMAL HIGH (ref 2.3–15.9)
RBC.: 3.03 MIL/uL — ABNORMAL LOW (ref 3.87–5.11)
RETIC CT PCT: 2.9 % (ref 0.4–3.1)
Retic Count, Absolute: 89.1 10*3/uL (ref 19.0–186.0)

## 2018-07-08 LAB — FOLATE: Folate: 22.9 ng/mL (ref >5.9)

## 2018-07-08 LAB — BRAIN NATRIURETIC PEPTIDE: B Natriuretic Peptide: 669.1 pg/mL — ABNORMAL HIGH (ref 0.0–100.0)

## 2018-07-08 LAB — FERRITIN: Ferritin: 90 ng/mL (ref 11–307)

## 2018-07-08 LAB — LACTATE DEHYDROGENASE: LDH: 2138 U/L — ABNORMAL HIGH (ref 98–192)

## 2018-07-08 LAB — CBG MONITORING, ED: Glucose-Capillary: 194 mg/dL — ABNORMAL HIGH (ref 70–99)

## 2018-07-08 LAB — SAVE SMEAR(SSMR), FOR PROVIDER SLIDE REVIEW

## 2018-07-08 LAB — PREPARE RBC (CROSSMATCH)

## 2018-07-08 MED ORDER — LACTULOSE 10 GM/15ML PO SOLN
30.0000 g | Freq: Two times a day (BID) | ORAL | Status: DC | PRN
  Filled 2018-07-08: qty 45

## 2018-07-08 MED ORDER — CARVEDILOL 3.125 MG PO TABS
3.1250 mg | ORAL_TABLET | Freq: Two times a day (BID) | ORAL | Status: DC
  Administered 2018-07-09 – 2018-07-11 (×5): 3.125 mg via ORAL
  Filled 2018-07-08 (×5): qty 1

## 2018-07-08 MED ORDER — INSULIN ASPART 100 UNIT/ML ~~LOC~~ SOLN
0.0000 [IU] | Freq: Three times a day (TID) | SUBCUTANEOUS | Status: DC
  Administered 2018-07-09 (×3): 2 [IU] via SUBCUTANEOUS
  Administered 2018-07-10: 3 [IU] via SUBCUTANEOUS
  Administered 2018-07-11: 2 [IU] via SUBCUTANEOUS

## 2018-07-08 MED ORDER — SENNOSIDES-DOCUSATE SODIUM 8.6-50 MG PO TABS: 1.0000 | ORAL_TABLET | Freq: Every evening | ORAL | Status: DC | PRN

## 2018-07-08 MED ORDER — FUROSEMIDE 40 MG PO TABS
40.0000 mg | ORAL_TABLET | Freq: Every day | ORAL | Status: DC
  Administered 2018-07-09 – 2018-07-11 (×3): 40 mg via ORAL
  Filled 2018-07-08 (×3): qty 1

## 2018-07-08 MED ORDER — ONDANSETRON HCL 4 MG/2ML IJ SOLN
4.0000 mg | Freq: Four times a day (QID) | INTRAMUSCULAR | Status: DC | PRN
  Filled 2018-07-08: qty 2

## 2018-07-08 MED ORDER — IPRATROPIUM-ALBUTEROL 0.5-2.5 (3) MG/3ML IN SOLN: 3.0000 mL | Freq: Four times a day (QID) | RESPIRATORY_TRACT | Status: DC | PRN

## 2018-07-08 MED ORDER — ONDANSETRON HCL 4 MG PO TABS: 4.0000 mg | ORAL_TABLET | Freq: Four times a day (QID) | ORAL | Status: DC | PRN

## 2018-07-08 MED ORDER — ACETAMINOPHEN 650 MG RE SUPP: 650.0000 mg | Freq: Four times a day (QID) | RECTAL | Status: DC | PRN

## 2018-07-08 MED ORDER — SODIUM CHLORIDE 0.9% IV SOLUTION
Freq: Once | INTRAVENOUS | Status: AC
  Administered 2018-07-09: 02:00:00 via INTRAVENOUS

## 2018-07-08 MED ORDER — HYDROCERIN EX CREA
1.0000 "application " | TOPICAL_CREAM | Freq: Two times a day (BID) | CUTANEOUS | Status: DC
  Administered 2018-07-09 – 2018-07-11 (×5): 1 via TOPICAL
  Filled 2018-07-08: qty 113

## 2018-07-08 MED ORDER — FERROUS GLUCONATE 324 (38 FE) MG PO TABS
324.0000 mg | ORAL_TABLET | Freq: Two times a day (BID) | ORAL | Status: DC
  Administered 2018-07-09: 324 mg via ORAL
  Filled 2018-07-08: qty 1

## 2018-07-08 MED ORDER — MOMETASONE FURO-FORMOTEROL FUM 100-5 MCG/ACT IN AERO: 2.0000 | INHALATION_SPRAY | Freq: Two times a day (BID) | RESPIRATORY_TRACT | Status: DC

## 2018-07-08 MED ORDER — NITROGLYCERIN 0.4 MG SL SUBL: 0.4000 mg | SUBLINGUAL_TABLET | SUBLINGUAL | Status: DC | PRN

## 2018-07-08 MED ORDER — FUROSEMIDE 10 MG/ML IJ SOLN
40.0000 mg | Freq: Once | INTRAMUSCULAR | Status: AC
  Administered 2018-07-09: 40 mg via INTRAVENOUS
  Filled 2018-07-08: qty 4

## 2018-07-08 MED ORDER — INSULIN DETEMIR 100 UNIT/ML ~~LOC~~ SOLN
9.0000 [IU] | Freq: Every day | SUBCUTANEOUS | Status: DC
  Administered 2018-07-09 – 2018-07-11 (×3): 9 [IU] via SUBCUTANEOUS
  Filled 2018-07-08 (×3): qty 0.09

## 2018-07-08 MED ORDER — ROSUVASTATIN CALCIUM 20 MG PO TABS
40.0000 mg | ORAL_TABLET | Freq: Every day | ORAL | Status: DC
  Administered 2018-07-09 – 2018-07-10 (×2): 40 mg via ORAL
  Filled 2018-07-08 (×2): qty 2

## 2018-07-08 MED ORDER — ALBUTEROL SULFATE (2.5 MG/3ML) 0.083% IN NEBU
3.0000 mL | INHALATION_SOLUTION | Freq: Four times a day (QID) | RESPIRATORY_TRACT | Status: DC | PRN
  Administered 2018-07-09: 3 mL via RESPIRATORY_TRACT
  Filled 2018-07-08 (×2): qty 3

## 2018-07-08 MED ORDER — ACETAMINOPHEN 325 MG PO TABS: 650.0000 mg | ORAL_TABLET | Freq: Four times a day (QID) | ORAL | Status: DC | PRN

## 2018-07-08 MED ORDER — FUROSEMIDE 10 MG/ML IJ SOLN: 40.0000 mg | Freq: Once | INTRAMUSCULAR | Status: DC

## 2018-07-08 MED ORDER — HYDRALAZINE HCL 20 MG/ML IJ SOLN: 5.0000 mg | INTRAMUSCULAR | Status: DC | PRN

## 2018-07-08 NOTE — ED Triage Notes (Signed)
Pt reports 1 hour PTA she started feeling cold which she reports is usually d/t acute exacerbation of her anemia. Pt reports chronic cough/ SOB, no worse today. States hx of blood transfusions, around 4/month. Pt in [facility] on arrival.

## 2018-07-08 NOTE — H&P (Addendum)
History and Physical    Tamara Silva XIP:[id number] DOB: 02/03/1961 DOA: 07/08/2018  Referring MD/NP/PA:   PCP: Antony Blackbird, MD   Patient coming from:  The patient is coming from home.  At baseline, pt is independent for most of ADL.        Chief Complaint: feeling cold and mild lightheadedness  HPI: Tamara Silva is a 58 y.o. female with medical history significant of iron deficiency anemia, hypertension, hyperlipidemia, diabetes mellitus, COPD, stroke, GERD, CAD, stent placement, CHF with EF of 40%, PE not on anticoagulants, CKD stage III, OSA on CPAP, who presents with feeling cold and mild lightheadedness.  Pt states that she has history of iron deficiency anemia, which required blood transfusion several times in the past.  Today she states that she feels cold and and has mild lightheadedness.  She feels like this is due to possible acute exacerbation of her anemia.  Patient states that she has chronic mild cough and shortness of breath due to COPD, which has not changed today.  Denies any chest pain.  No fever or chills.  Patient denies rectal bleeding, hematuria or bleeding anywhere. She states that she has dark stool occasionally. She is taking iron supplement.  No nausea vomiting, diarrhea or abdominal pain.  Denies symptoms of UTI.   ED Course: pt was found to have hemoglobin decreased from 7.9 on 07/05/2018 5.8, WBC 2.9, platelets of 130, worsening renal function, temperature 99.2, heart rate 80s, oxygen saturation 100% on room air, chest x-ray showed cardiomegaly with vascular congestion.  Patient is placed on telemetry bed for observation.   Review of Systems:    General: no fevers, has feeling cold, no body weight gain, has fatigue HEENT: no blurry vision, hearing changes or sore throat Respiratory: has SOB, coughing, no wheezing CV: no chest pain, no palpitations GI: no nausea, vomiting, abdominal pain, diarrhea, constipation GU: no dysuria, burning on urination, increased  urinary frequency, hematuria  Ext: has leg edema Neuro: no unilateral weakness, numbness, or tingling, no vision change or hearing loss Skin: no rash, no skin tear. MSK: No muscle spasm, no deformity, no limitation of range of movement in spin Heme: No easy bruising.  Travel history: No recent long distant travel.  Allergy:  Allergies  Allergen Reactions  . Metolazone Other (See Comments)    Dizziness and falling  . Plavix [Clopidogrel Bisulfate] Other (See Comments)    High PRU's - non-responder  . Ibuprofen Other (See Comments)    Pt states she is not supposed to take ibuprofen because of other meds she is taking  . Nsaids Other (See Comments)    "I do not take NSAIDs d/t interfering with other meds"    Past Medical History:  Diagnosis Date  . Asthma   . CAD (coronary artery disease)   . Chronic combined systolic (congestive) and diastolic (congestive) heart failure ([place])   . Chronic iron deficiency anemia   . Diabetes mellitus without complication ([place])   . DOE (dyspnea on exertion)   . Edema of both legs   . Hepatic steatosis   . Hypertension   . Pulmonary emboli ([place]) 2014    Past Surgical History:  Procedure Laterality Date  . COLONOSCOPY WITH PROPOFOL N/A 08/03/2013   Procedure: COLONOSCOPY WITH PROPOFOL;  Surgeon: Jeryl Columbia, MD;  Location: [facility];  Service: Endoscopy;  Laterality: N/A;  . CORONARY ANGIOPLASTY WITH STENT PLACEMENT     CAD in 2006 x 2 and 2009 2 more- place din REx in  [place] [facility] [place] [facility]  . CORONARY ANGIOPLASTY WITH STENT PLACEMENT  10/07/2013   Xience Alpine DES 2.75  mm x 15  mm  . ESOPHAGOGASTRODUODENOSCOPY (EGD) WITH PROPOFOL N/A 08/03/2013   Procedure: ESOPHAGOGASTRODUODENOSCOPY (EGD) WITH PROPOFOL;  Surgeon: Jeryl Columbia, MD;  Location: [facility];  Service: Endoscopy;  Laterality: N/A;  . LEFT HEART CATHETERIZATION WITH CORONARY ANGIOGRAM N/A 10/07/2013   Procedure: LEFT HEART CATHETERIZATION WITH CORONARY ANGIOGRAM;  Surgeon: Leonie Man, MD;  Location: [facility] CATH LAB;  Service: Cardiovascular;  Laterality: N/A;    Social History:  reports that she quit smoking about 4 years ago. Her smoking use included cigarettes. She has a 10.00 pack-year smoking history. She has never used smokeless tobacco. She reports that she does not drink alcohol or use drugs.  Family History:  Family History  Problem Relation Age of Onset  . Stroke Mother   . Hypertension Mother   . Colon cancer Father   . Diabetes Father   . Heart attack Father   . Sarcoidosis Other   . Breast cancer Paternal Aunt   . Emphysema Brother   . Lung disease Brother        Unknown type, 3 brothers, one with liver and lung disease  . Asthma Paternal Aunt      Prior to Admission medications   Medication Sig Start Date End Date Taking? Authorizing Provider  acetaminophen (TYLENOL) 325 MG tablet Take 1-2 tablets (325-650 mg total) by mouth every 4 (four) hours as needed for mild pain. 06/22/18   Love, Ivan Anchors, PA-C  albuterol (PROVENTIL HFA;VENTOLIN HFA) 108 (90 Base) MCG/ACT inhaler Inhale 2 puffs into the lungs every 6 (six) hours as needed for wheezing or shortness of breath. 06/28/18   Shelly Coss, MD  aspirin 81 MG tablet Take 1 tablet (81 mg total) by mouth daily. 10/09/13   Barrett, Evelene Croon, PA-C  budesonide-formoterol (SYMBICORT) 80-4.5 MCG/ACT inhaler Inhale 2 puffs into the lungs 2 times daily at 12 noon and 4 pm. 06/28/18   Shelly Coss, MD  carvedilol (COREG) 3.125 MG tablet Take 1 tablet (3.125 mg total) by mouth 2 (two) times daily with a meal. 06/22/18   Love, Ivan Anchors, PA-C  ferrous gluconate (FERGON) 324 MG tablet Take 1 tablet (324 mg total) by mouth 2 (two) times daily with a meal. 06/28/18   Shelly Coss, MD  furosemide (LASIX) 40 MG tablet Take 1 tablet (40 mg total) by mouth daily. 06/28/18   Shelly Coss, MD  hydrocerin (EUCERIN) CREA Apply 1 application topically 2 (two) times daily. 06/22/18   Love, Ivan Anchors, PA-C  Insulin Detemir  (LEVEMIR FLEXTOUCH) 100 UNIT/ML Pen Inject 18 Units into the skin daily. Patient taking differently: Inject 18 Units into the skin at bedtime.  06/22/18   Love, Ivan Anchors, PA-C  ipratropium-albuterol (DUONEB) 0.5-2.5 (3) MG/3ML SOLN Use nebulizer to inhale contents of one vial every 6 hours as needed for shortness of breath, wheezing 06/22/18   Love, Ivan Anchors, PA-C  lactulose (CHRONULAC) 10 GM/15ML solution Take 45 mLs (30 g total) by mouth 2 (two) times daily. 06/22/18   Love, Ivan Anchors, PA-C  NEEDLE, DISP, 26 G 26G X 1/2" MISC 1 application by Does not apply route daily. 06/22/18   Love, Ivan Anchors, PA-C  nitroGLYCERIN (NITROSTAT) 0.4 MG SL tablet Place 1 tablet (0.4 mg total) under the tongue every 5 (five) minutes as needed for chest pain. 01/27/18   Fulp, Cammie, MD  omeprazole (PRILOSEC) 40 MG  capsule Take 1 capsule (40 mg total) by mouth daily. 06/22/18   Love, Ivan Anchors, PA-C  potassium chloride 20 MEQ TBCR Take 20 mEq by mouth daily for 7 days. 06/28/18 07/05/18  Shelly Coss, MD  rosuvastatin (CRESTOR) 40 MG tablet Take 1 tablet (40 mg total) by mouth daily at 6 PM. 06/22/18   Bary Leriche, [place]    Physical Exam: Vitals:   07/08/18 2315 07/08/18 2330 07/09/18 0033 07/09/18 0045  BP: 135/67 132/67 128/67 140/72  Pulse: 86 88 86 88  Resp:      Temp:   98.6 F (37 C) 98.3 F (36.8 C)  TempSrc:   Oral Oral  SpO2: 100% 100% 99% 100%  Weight:   (!) 189.7 kg   Height:   5\' 7"  (1.702 m)    General: Not in acute distress. Pale looking. HEENT:       Eyes: PERRL, EOMI, no scleral icterus.       ENT: No discharge from the ears and nose, no pharynx injection, no tonsillar enlargement.        Neck: No JVD, no bruit, no mass felt. Heme: No neck lymph node enlargement. Cardiac: S1/S2, RRR, No murmurs, No gallops or rubs. Respiratory:  No rales, wheezing, rhonchi or rubs. GI: Soft, nondistended, nontender, no rebound pain, no organomegaly, BS present. GU: No hematuria Ext: 1+ pitting leg edema  bilaterally. 2+DP/PT pulse bilaterally. Musculoskeletal: No joint deformities, No joint redness or warmth, no limitation of ROM in spin. Skin: No rashes.  Neuro: Alert, oriented X3, cranial nerves II-XII grossly intact, moves all extremities normally.  Psych: Patient is not psychotic, no suicidal or hemocidal ideation.  Labs on Admission: I have personally reviewed following labs and imaging studies  CBC: Recent Labs  Lab 07/05/18 1219 07/08/18 2100  WBC 9.6 2.9*  NEUTROABS 7.0 1.8  HGB 7.9* 5.8*  HCT 27.1* 20.7*  MCV 88 92.8  PLT 394 767*   Basic Metabolic Panel: Recent Labs  Lab 07/05/18 1219 07/08/18 2100  NA 142 135  K 3.8 3.7  CL 100 94*  CO2 29 29  GLUCOSE 91 195*  BUN 26* 12  CREATININE 1.60* 1.31*  CALCIUM 8.4* 8.3*   GFR: Estimated Creatinine Clearance: 83.4 mL/min (A) (by C-G formula based on SCr of 1.31 mg/dL (H)). Liver Function Tests: No results for input(s): AST, ALT, ALKPHOS, BILITOT, PROT, ALBUMIN in the last 168 hours. No results for input(s): LIPASE, AMYLASE in the last 168 hours. No results for input(s): AMMONIA in the last 168 hours. Coagulation Profile: Recent Labs  Lab 07/08/18 2316  INR 1.20   Cardiac Enzymes: No results for input(s): CKTOTAL, CKMB, CKMBINDEX, TROPONINI in the last 168 hours. BNP (last 3 results) No results for input(s): PROBNP in the last 8760 hours. HbA1C: No results for input(s): HGBA1C in the last 72 hours. CBG: Recent Labs  Lab 07/08/18 2100 07/09/18 0029  GLUCAP 194* 190*   Lipid Profile: No results for input(s): CHOL, HDL, LDLCALC, TRIG, CHOLHDL, LDLDIRECT in the last 72 hours. Thyroid Function Tests: No results for input(s): TSH, T4TOTAL, FREET4, T3FREE, THYROIDAB in the last 72 hours. Anemia Panel: Recent Labs    07/08/18 2210  VITAMINB12 733  FOLATE 22.9  FERRITIN 90  TIBC 336  IRON 37  RETICCTPCT 2.9   Urine analysis:    Component Value Date/Time   COLORURINE YELLOW 06/24/2018 1937    APPEARANCEUR HAZY (A) 06/24/2018 1937   LABSPEC 1.017 06/24/2018 1937   PHURINE 7.0 06/24/2018 1937  GLUCOSEU NEGATIVE 06/24/2018 1937   HGBUR NEGATIVE 06/24/2018 1937   BILIRUBINUR NEGATIVE 06/24/2018 1937   KETONESUR NEGATIVE 06/24/2018 1937   PROTEINUR 100 (A) 06/24/2018 1937   UROBILINOGEN 2.0 (H) 02/21/2015 1914   NITRITE NEGATIVE 06/24/2018 1937   LEUKOCYTESUR NEGATIVE 06/24/2018 1937   Sepsis Labs: @LABRCNTIP (procalcitonin:4,lacticidven:4) )No results found for this or any previous visit (from the past 240 hour(s)).   Radiological Exams on Admission: Dg Chest Portable 1 View  Result Date: 07/08/2018 CLINICAL DATA:  Mild dyspnea, began feeling cold 1 hour ago, a typical feeling for exacerbation of her anemia, chronic cough shortness of breath, history coronary artery disease, CHF, diabetes mellitus, hypertension, former smoker EXAM: PORTABLE CHEST 1 VIEW COMPARISON:  Portable exam 2019 hours compared to 06/26/2018 FINDINGS: Enlargement of cardiac silhouette, chronic. Pulmonary vascular congestion. Mediastinal contours normal. Pleural calcification and scarring in the LEFT hemithorax unchanged. Mild diffuse BILATERAL interstitial infiltrates consistent with pulmonary edema. No pleural effusion or pneumothorax. Bones unremarkable. IMPRESSION: Enlargement of cardiac silhouette with pulmonary vascular congestion and mild pulmonary edema. Chronic scarring and pleural calcification of the LEFT hemithorax unchanged. Electronically Signed   By: Lavonia Dana M.D.   On: 07/08/2018 20:31     EKG: Independently reviewed.  Sinus rhythm, QTC 483, low voltage, PVC, Q waves in lead III/aVF, nonspecific T wave change.  Assessment/Plan Principal Problem:   Symptomatic anemia Active Problems:   Essential hypertension   Chronic combined systolic and diastolic heart failure (HCC)   CAD S/P percutaneous coronary angioplasty - prior PCI to LAD; RCA PCI: new Xience Alpine DES 2.75 mm x 15 mm     Dyslipidemia   COPD (chronic obstructive pulmonary disease) (HCC)   Iron deficiency anemia   Acute renal failure superimposed on stage 3 chronic kidney disease (HCC)   Pancytopenia (HCC)   Type II diabetes mellitus with renal manifestations (HCC)   Symptomatic anemia: Patient has worsening anemia, hemoglobin dropped from a 7.9 on 07/05/2018--> 5.8.  Actually patient has pancytopenia (WBC 2.9, platelet 130, hemoglobin 5.8).  Patient may have a slow GI blood loss.  She reports occasional dark stool, but no active rectal bleeding currently.  Denies vaginal bleeding or hematuria.  Patient had EGD and colonoscopy on 08/03/2013 by Dr. Watt Climes.  Patient was found to have few small diverticula and mild gastritis.  currently hemodynamically stable.  -We will placed on telemetry bed for observation - Check FOBT, LDH, peripheral smear, haptoglobin, anemia panel, LFT -Keep patient n.p.o. in case FOBT positive, may need to consult GI -Transfuse 2 unit of blood   Iron deficiency anemia and pancytopenia: -See above -Continue iron supplement  Chronic combined systolic and diastolic heart failure: 2D echo on 05/31/2018 showed EF of 40-45%.  Patient has leg edema and mild shortness of breath, chest x-ray showed cardiomegaly and vascular congestion, indicating that patient is high risk of developing CHF exacerbation.  Currently patient does not have acute respiratory distress. -Check a BMP - Continue home dose of oral Lasix 40 mg daily -Give 1 dose of IV Lasix 40 mg after finish first unit of blood transfusion  HTN:  -Continue home medications: Lasix, Coreg -IV hydralazine prn  CAD S/P percutaneous coronary angioplasty - prior PCI to LAD; RCA PCI: new Xience Alpine DES 2.75 mm x 15 mm : No chest pain -Hold aspirin since we needed to rule out GI bleeding -Continue Coreg, Crestor -PRN nitroglycerin  Dyslipidemia: -Crestor  AoCKD-III: Baseline Cre is ~1.0, pt's Cre is 1.31 and BUn 12 on admission. Likely  due  to dehydration and anemia  -Avoid renal toxic medication - Follow up renal function by BMP  Type II diabetes mellitus with renal manifestations: Last A1c 7.1 on 06/01/18, fairly controled. Patient is taking Levemir at home -will decrease Levemir dose from 18 to 9 units daily -SSI  COPD (chronic obstructive pulmonary disease) ([place]): Stable -Continue bronchodilators      DVT ppx: SCD Code Status: Full code Family Communication: None at bed side.   Disposition Plan:  Anticipate discharge back to previous home environment Consults called:  none Admission status: Obs / tele    Date of Service 07/09/2018    [facility]   If 7PM-7AM, please contact night-coverage [email or web address] Password [email or web address] 07/09/2018, 1:10 AM

## 2018-07-08 NOTE — ED Notes (Signed)
MD aware of critical Hgb of 5.8.

## 2018-07-08 NOTE — ED Notes (Signed)
CBG 194, RN Sharrie Rothman informed

## 2018-07-08 NOTE — ED Provider Notes (Signed)
[facility] EMERGENCY DEPARTMENT Provider Note   CSN: [id number] Arrival date & time: 07/08/18  1931     History   Chief Complaint Chief Complaint  Patient presents with  . Feeling Cold    HPI Tamara Silva is a 58 y.o. female.  HPI  Tamara Silva is a 58 y.o. female with PMH of, CAD, systolic/diastolic heart failure, chronic iron deficiency anemia, diabetes, hepatic steatosis, hypertension and PE on no anticoagulants at this time who presents with feeling cold about 1 hour.  She states that she has had no chest pain but only mild dyspnea.  Minimally changed from her baseline.  Compliant with her home 4 L nasal cannula and BiPAP at night.  Legs are slightly swollen but near her baseline.  No fevers but has had chills.  No diaphoresis.  No nausea or vomiting.  No blood in her stools and no melena.  Normal bowel movements.  Normal urination.  Has been able to get around her home and was attempting do push-ups on her walker and her leg exercises as instructed.  Tolerating fairly well until she got called this evening.  Past Medical History:  Diagnosis Date  . Asthma   . CAD (coronary artery disease)   . Chronic combined systolic (congestive) and diastolic (congestive) heart failure ([place])   . Chronic iron deficiency anemia   . Diabetes mellitus without complication ([place])   . DOE (dyspnea on exertion)   . Edema of both legs   . Hepatic steatosis   . Hypertension   . Pulmonary emboli [facility]) 2014    Patient Active Problem List   Diagnosis Date Noted  . Acute renal failure superimposed on stage 3 chronic kidney disease ([place]) 07/08/2018  . Pancytopenia ([place]) 07/08/2018  . Type II diabetes mellitus with renal manifestations ([place]) 07/08/2018  . Acute on chronic combined systolic and diastolic CHF (congestive heart failure) ([place]) 06/26/2018  . Hepatic steatosis 06/22/2018  . Chronic respiratory failure with hypoxia and hypercapnia (HCC)   . Hyperammonemia ([place])   .  Acute on chronic diastolic CHF (congestive heart failure) ([place])   . Supplemental oxygen dependent   . Slow transit constipation   . Encephalopathy 06/09/2018  . Cocaine abuse ([place])   . Diabetes mellitus type 2 in obese ([place])   . Generalized tonic-clonic seizure ([place]) 06/05/2018  . Cerebral embolism with cerebral infarction 06/01/2018  . AKI (acute kidney injury) ([place]) 05/30/2018  . Pressure injury of skin 05/30/2018  . Symptomatic anemia 05/29/2018  . Chronic respiratory failure with hypoxia, on home O2 therapy ([place]) 01/27/2018  . Iron deficiency anemia 01/27/2018  . Tobacco abuse disorder 10/03/2017  . Dyslipidemia 04/29/2016  . Primary insomnia 04/29/2016  . Hypoxemia 09/08/2015  . Congestive heart failure (CHF) ([place]) 09/08/2015  . Obesity hypoventilation syndrome ([place])   . Microcytic anemia   . Acute encephalopathy   . Uncontrolled type 2 diabetes mellitus with hyperosmolarity without coma ([place])   . Acute on chronic respiratory failure with hypercapnia ([place])   . CAP (community acquired pneumonia)   . OSA (obstructive sleep apnea)   . Acute and chr resp failure, unsp w hypoxia or hypercapnia ([place]) 06/22/2015  . Acute on chronic combined systolic (congestive) and diastolic (congestive) heart failure ([place]) 06/21/2015  . Hypoglycemia 06/21/2015  . Uncontrolled type 2 diabetes mellitus without complication, with long-term current use of insulin ([place])   . Dyslipidemia associated with type 2 diabetes mellitus ([place]) 05/21/2015  . Hyperkalemia 05/21/2015  . COPD GOLD III  06/10/2014  . Acute respiratory failure with hypoxia ([place]) 03/04/2014  . CKD (chronic kidney disease), stage II 02/07/2014  . COPD exacerbation ([place]) 02/01/2014  . CAD S/P percutaneous coronary angioplasty - prior PCI to LAD; RCA PCI: new Xience Alpine DES 2.75 mm x 15 mm  10/07/2013  . Morbid obesity ([place]) 07/30/2013  . Chronic combined systolic and diastolic heart failure ([place]) 07/10/2013  . Chronic pulmonary  embolism ([place]) 02/08/2013  . Bipolar disorder ([place]) 02/08/2013  . Essential hypertension 02/08/2013  . Chronic anticoagulation 11/27/2012  . Edema extremities 10/15/2012  . COPD (chronic obstructive pulmonary disease) ([place]) 10/15/2012  . Pulmonary embolism on right ([place]) 10/15/2012  . Hypertension 10/15/2012  . Insulin dependent diabetes mellitus ([place]) 10/15/2012    Past Surgical History:  Procedure Laterality Date  . COLONOSCOPY WITH PROPOFOL N/A 08/03/2013   Procedure: COLONOSCOPY WITH PROPOFOL;  Surgeon: Jeryl Columbia, MD;  Location: [facility] ENDOSCOPY;  Service: Endoscopy;  Laterality: N/A;  . CORONARY ANGIOPLASTY WITH STENT PLACEMENT     CAD in 2006 x 2 and 2009 2 more- place din REx in [place] and [place] [facility]  . CORONARY ANGIOPLASTY WITH STENT PLACEMENT  10/07/2013   Xience Alpine DES 2.75  mm x 15  mm  . ESOPHAGOGASTRODUODENOSCOPY (EGD) WITH PROPOFOL N/A 08/03/2013   Procedure: ESOPHAGOGASTRODUODENOSCOPY (EGD) WITH PROPOFOL;  Surgeon: Jeryl Columbia, MD;  Location: [facility] ENDOSCOPY;  Service: Endoscopy;  Laterality: N/A;  . LEFT HEART CATHETERIZATION WITH CORONARY ANGIOGRAM N/A 10/07/2013   Procedure: LEFT HEART CATHETERIZATION WITH CORONARY ANGIOGRAM;  Surgeon: Leonie Man, MD;  Location: [facility] CATH LAB;  Service: Cardiovascular;  Laterality: N/A;     OB History   No obstetric history on file.      Home Medications    Prior to Admission medications   Medication Sig Start Date End Date Taking? Authorizing Provider  albuterol (PROAIR HFA) 108 (90 Base) MCG/ACT inhaler Inhale 2 puffs into the lungs every 6 (six) hours as needed for wheezing or shortness of breath.   Yes [provider]  acetaminophen (TYLENOL) 325 MG tablet Take 1-2 tablets (325-650 mg total) by mouth every 4 (four) hours as needed for mild pain. 06/22/18   Love, Ivan Anchors, PA-C  albuterol (PROVENTIL HFA;VENTOLIN HFA) 108 (90 Base) MCG/ACT inhaler Inhale 2 puffs into the lungs every 6 (six) hours as needed for wheezing  or shortness of breath. 06/28/18   Shelly Coss, MD  aspirin 81 MG tablet Take 1 tablet (81 mg total) by mouth daily. 10/09/13   Barrett, Evelene Croon, PA-C  budesonide-formoterol (SYMBICORT) 80-4.5 MCG/ACT inhaler Inhale 2 puffs into the lungs 2 times daily at 12 noon and 4 pm. 06/28/18   Shelly Coss, MD  carvedilol (COREG) 3.125 MG tablet Take 1 tablet (3.125 mg total) by mouth 2 (two) times daily with a meal. 06/22/18   Love, Ivan Anchors, PA-C  ferrous gluconate (FERGON) 324 MG tablet Take 1 tablet (324 mg total) by mouth 2 (two) times daily with a meal. 06/28/18   Shelly Coss, MD  furosemide (LASIX) 40 MG tablet Take 1 tablet (40 mg total) by mouth daily. 06/28/18   Shelly Coss, MD  hydrocerin (EUCERIN) CREA Apply 1 application topically 2 (two) times daily. 06/22/18   Love, Ivan Anchors, PA-C  Insulin Detemir (LEVEMIR FLEXTOUCH) 100 UNIT/ML Pen Inject 18 Units into the skin daily. Patient taking differently: Inject 18 Units into the skin at bedtime.  06/22/18   Love, Ivan Anchors, PA-C  ipratropium-albuterol (DUONEB) 0.5-2.5 (3) MG/3ML  SOLN Use nebulizer to inhale contents of one vial every 6 hours as needed for shortness of breath, wheezing 06/22/18   Love, Ivan Anchors, PA-C  lactulose (CHRONULAC) 10 GM/15ML solution Take 45 mLs (30 g total) by mouth 2 (two) times daily. 06/22/18   Love, Ivan Anchors, PA-C  NEEDLE, DISP, 26 G 26G X 1/2" MISC 1 application by Does not apply route daily. 06/22/18   Love, Ivan Anchors, PA-C  nitroGLYCERIN (NITROSTAT) 0.4 MG SL tablet Place 1 tablet (0.4 mg total) under the tongue every 5 (five) minutes as needed for chest pain. 01/27/18   Fulp, Cammie, MD  omeprazole (PRILOSEC) 40 MG capsule Take 1 capsule (40 mg total) by mouth daily. 06/22/18   Love, Ivan Anchors, PA-C  potassium chloride 20 MEQ TBCR Take 20 mEq by mouth daily for 7 days. 06/28/18 07/08/18  Shelly Coss, MD  rosuvastatin (CRESTOR) 40 MG tablet Take 1 tablet (40 mg total) by mouth daily at 6 PM. 06/22/18   Love, Ivan Anchors,  PA-C    Family History Family History  Problem Relation Age of Onset  . Stroke Mother   . Hypertension Mother   . Colon cancer Father   . Diabetes Father   . Heart attack Father   . Sarcoidosis Other   . Breast cancer Paternal Aunt   . Emphysema Brother   . Lung disease Brother        Unknown type, 3 brothers, one with liver and lung disease  . Asthma Paternal Aunt     Social History Social History   Tobacco Use  . Smoking status: Former Smoker    Packs/day: 0.50    Years: 20.00    Pack years: 10.00    Types: Cigarettes    Last attempt to quit: 04/30/2014    Years since quitting: 4.1  . Smokeless tobacco: Never Used  . Tobacco comment: smoked off and on x 20 years  Substance Use Topics  . Alcohol use: No    Alcohol/week: 0.0 standard drinks  . Drug use: No     Allergies   Metolazone; Plavix [clopidogrel bisulfate]; Ibuprofen; and Nsaids   Review of Systems Review of Systems  Constitutional: Positive for chills. Negative for fever.  HENT: Negative for ear pain and sore throat.   Eyes: Negative for pain and visual disturbance.  Respiratory: Positive for shortness of breath. Negative for cough.   Cardiovascular: Negative for chest pain and palpitations.  Gastrointestinal: Negative for abdominal pain and vomiting.  Genitourinary: Negative for dysuria and hematuria.  Musculoskeletal: Negative for arthralgias and back pain.  Skin: Negative for color change and rash.  Neurological: Negative for seizures and syncope.  All other systems reviewed and are negative.    Physical Exam Updated Vital Signs BP 128/67 (BP Location: Right Arm)   Pulse 86   Temp 98.6 F (37 C) (Oral)   Resp (!) 27   Ht 5\' 7"  (1.702 m)   Wt (!) 189.7 kg   LMP 07/28/2013 Comment: perimenopausal  SpO2 99%   BMI 65.50 kg/m   Physical Exam Vitals signs and nursing note reviewed.  Constitutional:      General: She is not in acute distress.    Appearance: She is well-developed. She is  obese. She is ill-appearing (Chronically ill).  HENT:     Head: Normocephalic and atraumatic.  Eyes:     Conjunctiva/sclera: Conjunctivae normal.  Neck:     Musculoskeletal: Neck supple.  Cardiovascular:     Rate and Rhythm: Normal  rate and regular rhythm.     Heart sounds: No murmur.  Pulmonary:     Effort: Pulmonary effort is normal.     Breath sounds: Examination of the right-lower field reveals decreased breath sounds. Examination of the left-lower field reveals decreased breath sounds. Decreased breath sounds present.  Abdominal:     Palpations: Abdomen is soft.     Tenderness: There is no abdominal tenderness. There is no guarding or rebound.  Musculoskeletal:     Right lower leg: Edema (trace, chronic) present.     Left lower leg: Edema (trace, chronic) present.  Skin:    General: Skin is warm and dry.  Neurological:     General: No focal deficit present.     Mental Status: She is alert and oriented to person, place, and time.     GCS: GCS eye subscore is 4. GCS verbal subscore is 5. GCS motor subscore is 6.     Cranial Nerves: Cranial nerves are intact.     Sensory: Sensation is intact.     Motor: Motor function is intact.     Coordination: Coordination is intact.  Psychiatric:        Behavior: Behavior is cooperative.      ED Treatments / Results  Labs (all labs ordered are listed, but only abnormal results are displayed) Labs Reviewed  CBC WITH DIFFERENTIAL/PLATELET - Abnormal; Notable for the following components:      Result Value   WBC 2.9 (*)    RBC 2.23 (*)    Hemoglobin 5.8 (*)    HCT 20.7 (*)    MCHC 28.0 (*)    RDW 24.9 (*)    Platelets 130 (*)    All other components within normal limits  BASIC METABOLIC PANEL - Abnormal; Notable for the following components:   Chloride 94 (*)    Glucose, Bld 195 (*)    Creatinine, Ser 1.31 (*)    Calcium 8.3 (*)    GFR calc non Af Amer 45 (*)    GFR calc Af Amer 52 (*)    All other components within normal  limits  BRAIN NATRIURETIC PEPTIDE - Abnormal; Notable for the following components:   B Natriuretic Peptide 669.1 (*)    All other components within normal limits  RETICULOCYTES - Abnormal; Notable for the following components:   RBC. 3.03 (*)    Immature Retic Fract 26.2 (*)    All other components within normal limits  LACTATE DEHYDROGENASE - Abnormal; Notable for the following components:   LDH 2,138 (*)    All other components within normal limits  GLUCOSE, CAPILLARY - Abnormal; Notable for the following components:   Glucose-Capillary 190 (*)    All other components within normal limits  CBG MONITORING, ED - Abnormal; Notable for the following components:   Glucose-Capillary 194 (*)    All other components within normal limits  CULTURE, BLOOD (ROUTINE X 2)  CULTURE, BLOOD (ROUTINE X 2)  VITAMIN B12  FOLATE  IRON AND TIBC  FERRITIN  SAVE SMEAR (SSMR)  PROTIME-INR  PATHOLOGIST SMEAR REVIEW  HAPTOGLOBIN  BASIC METABOLIC PANEL  CBC  TYPE AND SCREEN  PREPARE RBC (CROSSMATCH)    EKG EKG Interpretation  Date/Time:  Saturday July 08 2018 19:43:33 EST Ventricular Rate:  91 PR Interval:    QRS Duration: 97 QT Interval:  392 QTC Calculation: 483 R Axis:   89 Text Interpretation:  Sinus rhythm Multiform ventricular premature complexes Non-specific ST-t changes No significant change since last  tracing Confirmed by Lajean Saver [phone]) on 07/08/2018 7:54:51 PM   Radiology Dg Chest Portable 1 View  Result Date: 07/08/2018 CLINICAL DATA:  Mild dyspnea, began feeling cold 1 hour ago, a typical feeling for exacerbation of her anemia, chronic cough shortness of breath, history coronary artery disease, CHF, diabetes mellitus, hypertension, former smoker EXAM: PORTABLE CHEST 1 VIEW COMPARISON:  Portable exam 2019 hours compared to 06/26/2018 FINDINGS: Enlargement of cardiac silhouette, chronic. Pulmonary vascular congestion. Mediastinal contours normal. Pleural calcification and  scarring in the LEFT hemithorax unchanged. Mild diffuse BILATERAL interstitial infiltrates consistent with pulmonary edema. No pleural effusion or pneumothorax. Bones unremarkable. IMPRESSION: Enlargement of cardiac silhouette with pulmonary vascular congestion and mild pulmonary edema. Chronic scarring and pleural calcification of the LEFT hemithorax unchanged. Electronically Signed   By: Lavonia Dana M.D.   On: 07/08/2018 20:31    Procedures Procedures (including critical care time)  Medications Ordered in ED Medications  0.9 %  sodium chloride infusion (Manually program via Guardrails IV Fluids) (has no administration in time range)  carvedilol (COREG) tablet 3.125 mg (has no administration in time range)  furosemide (LASIX) tablet 40 mg (has no administration in time range)  nitroGLYCERIN (NITROSTAT) SL tablet 0.4 mg (has no administration in time range)  rosuvastatin (CRESTOR) tablet 40 mg (has no administration in time range)  insulin detemir (LEVEMIR) injection 9 Units (has no administration in time range)  lactulose (CHRONULAC) 10 GM/15ML solution 30 g (has no administration in time range)  ferrous gluconate (FERGON) tablet 324 mg (has no administration in time range)  albuterol (PROVENTIL) (2.5 MG/3ML) 0.083% nebulizer solution 3 mL (has no administration in time range)  ipratropium-albuterol (DUONEB) 0.5-2.5 (3) MG/3ML nebulizer solution 3 mL (has no administration in time range)  hydrocerin (EUCERIN) cream 1 application (has no administration in time range)  insulin aspart (novoLOG) injection 0-9 Units (has no administration in time range)  furosemide (LASIX) injection 40 mg (has no administration in time range)  acetaminophen (TYLENOL) tablet 650 mg (has no administration in time range)    Or  acetaminophen (TYLENOL) suppository 650 mg (has no administration in time range)  senna-docusate (Senokot-S) tablet 1 tablet (has no administration in time range)  ondansetron (ZOFRAN)  tablet 4 mg (has no administration in time range)    Or  ondansetron (ZOFRAN) injection 4 mg (has no administration in time range)  hydrALAZINE (APRESOLINE) injection 5 mg (has no administration in time range)  mometasone-formoterol (DULERA) 100-5 MCG/ACT inhaler 2 puff (has no administration in time range)     Initial Impression / Assessment and Plan / ED Course  I have reviewed the triage vital signs and the nursing notes.  Pertinent labs & imaging results that were available during my care of the patient were reviewed by me and considered in my medical decision making (see chart for details).     MDM:  Imaging: Chest x-ray shows enlargement of the cardiac silhouette with pulmonary vascular congestion and mild pulmonary edema.  Chronic scarring and pleural calcification of the left hemithorax unchanged.  ED Provider Interpretation of EKG: Sinus rhythm with a rate 91 bpm, normal axis, no ST segment elevation or depression, pathologic T wave abnormality.  QTC prolonged at 483.  No bundle-branch block.  No delta wave.  Labs: Glucose 134, BNP 669 (prior ranging from 6 52-12 100), BMP with creatinine of 1.3 near baseline, CBC with hemoglobin 5.8 decreased in her baseline of around 8, white count 2.9, platelets 130  On initial evaluation, patient appears  chronically ill. Afebrile and hemodynamically stable. Alert and oriented x4, pleasant, and cooperative.  Presents with feeling cold as above.  Chart review from recent hospitalization and prior visits shows an EF of around 40 to 45% in early January with grade 2 diastolic dysfunction.  Started on Lasix in early January.  On home oxygen at 4 L/min at baseline and BiPAP at night.  Baseline hemoglobin ranges around 7-8 and received 2 units of PRBCs during prior hospitalization at the end of January.  Labs indicated iron deficiency anemia at that time.  No sources of blood loss.  On exam, patient appears to be at her baseline.  Decreased breath  sounds present but no increased work of breathing.  On her home 4 L nasal cannula.  Chest x-ray as above without acute pathology.  BNP around her baseline, along the lower limit of her prior values.  Found to be acutely anemic and suspect symptomatic anemia.  Ordered for transfusion of 2 units PRBCs with a hemoglobin of 5.8.  She will be high risk for volume overload but otherwise appears stable at this time.  Requires further evaluation of her anemia as an inpatient given that her markers are slightly inconsistent with presumed diagnosis of iron deficiency anemia.  No report of GI loss and no history of GI bleed.  She was admitted to the hospital service in stable condition.  The plan for this patient was discussed with Dr. Ashok Cordia who voiced agreement and who oversaw evaluation and treatment of this patient.   The patient was fully informed and involved with the history taking, evaluation, workup including labs/images, and plan. The patient's concerns and questions were addressed to the patient's satisfaction and she expressed agreement with the plan to admit.    Final Clinical Impressions(s) / ED Diagnoses   Final diagnoses:  Symptomatic anemia    ED Discharge Orders    None       Nastashia Gallo, Rodena Goldmann, MD 07/09/18 Alena Bills    Lajean Saver, MD 07/09/18 1620    Lajean Saver, MD 07/21/18 [phone]

## 2018-07-08 NOTE — ED Notes (Signed)
Delay in blood cultures,  Pt needs arm band

## 2018-07-09 ENCOUNTER — Encounter (HOSPITAL_COMMUNITY): Payer: Self-pay | Admitting: General Practice

## 2018-07-09 DIAGNOSIS — D649 Anemia, unspecified: Secondary | ICD-10-CM | POA: Diagnosis not present

## 2018-07-09 DIAGNOSIS — N170 Acute kidney failure with tubular necrosis: Secondary | ICD-10-CM

## 2018-07-09 DIAGNOSIS — I5042 Chronic combined systolic (congestive) and diastolic (congestive) heart failure: Secondary | ICD-10-CM | POA: Diagnosis not present

## 2018-07-09 DIAGNOSIS — Z6841 Body Mass Index (BMI) 40.0 and over, adult: Secondary | ICD-10-CM

## 2018-07-09 DIAGNOSIS — E119 Type 2 diabetes mellitus without complications: Secondary | ICD-10-CM

## 2018-07-09 DIAGNOSIS — J9611 Chronic respiratory failure with hypoxia: Secondary | ICD-10-CM

## 2018-07-09 DIAGNOSIS — J438 Other emphysema: Secondary | ICD-10-CM | POA: Diagnosis not present

## 2018-07-09 DIAGNOSIS — R471 Dysarthria and anarthria: Secondary | ICD-10-CM

## 2018-07-09 DIAGNOSIS — Z9981 Dependence on supplemental oxygen: Secondary | ICD-10-CM

## 2018-07-09 DIAGNOSIS — Z9989 Dependence on other enabling machines and devices: Secondary | ICD-10-CM

## 2018-07-09 LAB — URINALYSIS, ROUTINE W REFLEX MICROSCOPIC
Bilirubin Urine: NEGATIVE
Glucose, UA: 50 mg/dL — AB
Ketones, ur: NEGATIVE mg/dL
Leukocytes, UA: NEGATIVE
Nitrite: NEGATIVE
PH: 6 (ref 5.0–8.0)
Protein, ur: 30 mg/dL — AB
Specific Gravity, Urine: 1.011 (ref 1.005–1.030)

## 2018-07-09 LAB — RAPID URINE DRUG SCREEN, HOSP PERFORMED
Amphetamines: NOT DETECTED
BENZODIAZEPINES: NOT DETECTED
Barbiturates: NOT DETECTED
Cocaine: NOT DETECTED
Opiates: NOT DETECTED
Tetrahydrocannabinol: NOT DETECTED

## 2018-07-09 LAB — BASIC METABOLIC PANEL
Anion gap: 7 (ref 5–15)
BUN: 10 mg/dL (ref 6–20)
CO2: 33 mmol/L — ABNORMAL HIGH (ref 22–32)
Calcium: 8.6 mg/dL — ABNORMAL LOW (ref 8.9–10.3)
Chloride: 97 mmol/L — ABNORMAL LOW (ref 98–111)
Creatinine, Ser: 1.32 mg/dL — ABNORMAL HIGH (ref 0.44–1.00)
GFR calc Af Amer: 51 mL/min — ABNORMAL LOW (ref >60)
GFR calc non Af Amer: 44 mL/min — ABNORMAL LOW (ref >60)
Glucose, Bld: 205 mg/dL — ABNORMAL HIGH (ref 70–99)
Potassium: 3.6 mmol/L (ref 3.5–5.1)
Sodium: 137 mmol/L (ref 135–145)

## 2018-07-09 LAB — GLUCOSE, CAPILLARY
GLUCOSE-CAPILLARY: 128 mg/dL — AB (ref 70–99)
Glucose-Capillary: 165 mg/dL — ABNORMAL HIGH (ref 70–99)
Glucose-Capillary: 181 mg/dL — ABNORMAL HIGH (ref 70–99)
Glucose-Capillary: 190 mg/dL — ABNORMAL HIGH (ref 70–99)
Glucose-Capillary: 199 mg/dL — ABNORMAL HIGH (ref 70–99)

## 2018-07-09 LAB — CBC
HCT: 32.1 % — ABNORMAL LOW (ref 36.0–46.0)
Hemoglobin: 9.2 g/dL — ABNORMAL LOW (ref 12.0–15.0)
MCH: 26 pg (ref 26.0–34.0)
MCHC: 28.7 g/dL — ABNORMAL LOW (ref 30.0–36.0)
MCV: 90.7 fL (ref 80.0–100.0)
Platelets: ADEQUATE 10*3/uL (ref 150–400)
RBC: 3.54 MIL/uL — ABNORMAL LOW (ref 3.87–5.11)
RDW: 23.3 % — ABNORMAL HIGH (ref 11.5–15.5)
WBC: 7.9 10*3/uL (ref 4.0–10.5)
nRBC: 0 % (ref 0.0–0.2)

## 2018-07-09 LAB — CBC WITH DIFFERENTIAL/PLATELET
Abs Immature Granulocytes: 0.01 10*3/uL (ref 0.00–0.07)
Basophils Absolute: 0 10*3/uL (ref 0.0–0.1)
Basophils Relative: 0 %
Eosinophils Absolute: 0.1 10*3/uL (ref 0.0–0.5)
Eosinophils Relative: 2 %
HCT: 20.7 % — ABNORMAL LOW (ref 36.0–46.0)
Hemoglobin: 5.8 g/dL — CL (ref 12.0–15.0)
Immature Granulocytes: 0 %
Lymphocytes Relative: 25 %
Lymphs Abs: 0.7 10*3/uL (ref 0.7–4.0)
MCH: 26 pg (ref 26.0–34.0)
MCHC: 28 g/dL — ABNORMAL LOW (ref 30.0–36.0)
MCV: 92.8 fL (ref 80.0–100.0)
MONOS PCT: 10 %
Monocytes Absolute: 0.3 10*3/uL (ref 0.1–1.0)
Neutro Abs: 1.8 10*3/uL (ref 1.7–7.7)
Neutrophils Relative %: 63 %
Platelets: 130 10*3/uL — ABNORMAL LOW (ref 150–400)
RBC: 2.23 MIL/uL — ABNORMAL LOW (ref 3.87–5.11)
RDW: 24.9 % — ABNORMAL HIGH (ref 11.5–15.5)
WBC: 2.9 10*3/uL — ABNORMAL LOW (ref 4.0–10.5)
nRBC: 0 % (ref 0.0–0.2)

## 2018-07-09 LAB — HEPATIC FUNCTION PANEL
ALBUMIN: 2.5 g/dL — AB (ref 3.5–5.0)
ALT: 19 U/L (ref 0–44)
AST: 26 U/L (ref 15–41)
Alkaline Phosphatase: 120 U/L (ref 38–126)
Bilirubin, Direct: 0.7 mg/dL — ABNORMAL HIGH (ref 0.0–0.2)
Indirect Bilirubin: 1.1 mg/dL — ABNORMAL HIGH (ref 0.3–0.9)
Total Bilirubin: 1.8 mg/dL — ABNORMAL HIGH (ref 0.3–1.2)
Total Protein: 8.2 g/dL — ABNORMAL HIGH (ref 6.5–8.1)

## 2018-07-09 MED ORDER — PANTOPRAZOLE SODIUM 40 MG PO TBEC
40.0000 mg | DELAYED_RELEASE_TABLET | Freq: Every day | ORAL | Status: DC
  Administered 2018-07-09 – 2018-07-11 (×3): 40 mg via ORAL
  Filled 2018-07-09 (×3): qty 1

## 2018-07-09 MED ORDER — MOMETASONE FURO-FORMOTEROL FUM 100-5 MCG/ACT IN AERO
2.0000 | INHALATION_SPRAY | Freq: Two times a day (BID) | RESPIRATORY_TRACT | Status: DC
  Administered 2018-07-09 – 2018-07-11 (×5): 2 via RESPIRATORY_TRACT
  Filled 2018-07-09 (×2): qty 8.8

## 2018-07-09 MED ORDER — PHENOL 1.4 % MT LIQD
1.0000 | OROMUCOSAL | Status: DC | PRN
  Administered 2018-07-10: 1 via OROMUCOSAL

## 2018-07-09 MED ORDER — PEG 3350-KCL-NA BICARB-NACL 420 G PO SOLR
4000.0000 mL | Freq: Once | ORAL | Status: DC
  Filled 2018-07-09 (×2): qty 4000

## 2018-07-09 MED ORDER — SODIUM CHLORIDE 0.9 % IV SOLN
INTRAVENOUS | Status: DC
  Administered 2018-07-09: 17:00:00 via INTRAVENOUS

## 2018-07-09 NOTE — Progress Notes (Signed)
Ist unit of blood complete.  Pt. Tolerated well.

## 2018-07-09 NOTE — Progress Notes (Signed)
PROGRESS NOTE  Tamara Silva AOZ:[id number] DOB: [birth year] DOA: 07/08/2018 PCP: Antony Blackbird, MD  HPI/Recap of past 24 hours:  Feeling better after two units prbc transfusion, repeat cbc pending,  She reports having intermittent dark stool, sometime has blood tinged sputum, denies overt bleeding  She has intermittent dry cough during encounter, reports this is chronic, she is on 4liter o4 chronically  She has bilateral lower extremity trace pitting edema and chronic venous stasis changed on legs, she reports legs are usually more swollen then today  She request to see heart failure specialist while she is here in the hospital, report she missed her appointment with them due to lack of transportation, she states SCAT service is too expensive , she can not afford to pay 1.5dollars.   She reports is getting home health services, she uses a walker at home, she reports used to be able to go out to get her mail prior to stroke, but now she feels sob and tired walking from living room to the kitchen.  She lives with her boyfriend who dose not read or write.  Assessment/Plan: Principal Problem:   Symptomatic anemia Active Problems:   Essential hypertension   Chronic combined systolic and diastolic heart failure (HCC)   CAD S/P percutaneous coronary angioplasty - prior PCI to LAD; RCA PCI: new Xience Alpine DES 2.75 mm x 15 mm    Dyslipidemia   COPD (chronic obstructive pulmonary disease) (HCC)   Iron deficiency anemia   Acute renal failure superimposed on stage 3 chronic kidney disease (HCC)   Pancytopenia (HCC)   Type II diabetes mellitus with renal manifestations (HCC)  Acute on chronic anemia, presented with symptomatic anemia ( feeling cold and lightheadedness)  -no overt bleeding, but reports intermittent dark stool -s/p two prbc , repeat cbc pending -retic inappropriately low, iron panel has improved compare to a month ago, folate and b12 unremarkable, tbilin unremarkable,    -anemia likely due to anemia of chronic disease and iron deficiency from intermittent gi loss, will add on spep/light chains for completeness -Case discussed with [facility] Dr Therisa Doyne who is going to see this patient in consult today, per DR Therisa Doyne, keep patient on clears today, likely plan to scope tomorrow  AoCKD-III: Baseline Cre is ~1.0, pt's Cre is 1.31 and BUn 12 on admission. Likely due to dehydration and anemia  -Avoid renal toxic medication - ua pending collection  CAD S/P percutaneous coronary angioplasty - prior PCI to LAD; RCA PCI: new Xience Alpine DES 2.75 mm x 15 mm : No chest pain -Hold aspirin since we needed to rule out GI bleeding -Continue Coreg, Crestor  Chronic combined systolic and diastolic heart failure on home 02:  -2D echo on 05/31/2018 showed EF of 40-45%.  Patient has leg edema and mild shortness of breath, chest x-ray showed cardiomegaly and vascular congestion -She was hospitalized from 1/25 to 1/29 due to acute on chronic heart failure. She was discharged home on nightly bipap and home o2 6liters Per chart review, she was on torsemide at one point, she is discharged on lasix from recent hospitalization -strict intake and output, 700cc urine documented since admission  Insulin dependent dm2 a1c 7 in 06/01/2018 ( hgb 7.3 on 1/2) -am blood glucose 195, adjust insulin  H/o CVA She was hospitalized from 12/30 to 1/10 for a right MCA/PCA watershed area stroke and a 2 cm infarct in the inferior right parietal lobe (felt to be incidental) , she had hypercapnic respiratory failure, and tonic-clonic  seizure required intubation during the hospitalization. She eventually was discharged to inpatient rehab (1/10-1/23), now at home with home health.    per progress note from 1/10 by Dr Earnest Conroy  Right MCA/PCA watershed/right parietal CVA:?  Embolic vs cocaine induced. Urine drug screen positive for cocaine on admission . Per neurology note from 1/2 : " Given patientnot a good  long-term AC candidated/tchronic anemia, will not pursue further embolic inpatient workup (TEE, loop, hypercoagulable labs) can consider possible 30-day monitor as an OP to look for AF.". Cardiology and neurology recommended discontinuation of Effient since post stroke, patient refractory to Plavix. On aspirin and statins. Echo EF 45%  Tonic-clonic seizures: Secondary to problem #1 versus problem #2 vs cocaine induced.  EEG normal.  Received Keppra load but now off antiepileptics.  No further seizure episodes."  H/o chronic LLL PE  (2014), not on anticoagulation  COPD /chronic hypoxic respiratory failure on 4liter o2 24/7 at home for years Former smoker , reports quit in 2015  Morbid obesity:/OSA on cpap (  reprots cpap machine has been broken for at least a year per chart review) Body mass index is 65.5 kg/m.   FTT: will get PT eval, will likely needs home health, case manager consulted  H/o cocaine use (She doesn't want this discussed with her daughter.)  Patient reports not able to go to doctors follow up appointment, due to not able to afford SCAT services, social worker consulted.   Doctors making house call?   Code Status: full  Family Communication: patient   Disposition Plan: not ready to discharge   Consultants:  [facility]   Procedures:  none  Antibiotics:  none   Objective: BP 130/84   Pulse 85   Temp 98.3 F (36.8 C) (Oral)   Resp 17   Ht 5\' 7"  (1.702 m)   Wt (!) 189.7 kg   LMP 07/28/2013 Comment: perimenopausal  SpO2 100%   BMI 65.50 kg/m   Intake/Output Summary (Last 24 hours) at 07/09/2018 4481 Last data filed at 07/09/2018 0654 Gross per 24 hour  Intake 0 ml  Output 0 ml  Net 0 ml   Filed Weights   07/08/18 1934 07/09/18 0033  Weight: (!) 136.5 kg (!) 189.7 kg    Exam: Patient is examined daily including today on 07/09/2018, exams remain the same as of yesterday except that has changed    General:  NAD, chronically ill, slurred speech,  report at baseline  Cardiovascular: RRR  Respiratory: CTABL  Abdomen: Soft/ND/NT, positive BS  Musculoskeletal: trace bilateral lower extremity pitting Edema, chronic venus stasis changes  Neuro: alert, oriented   Data Reviewed: Basic Metabolic Panel: Recent Labs  Lab 07/05/18 1219 07/08/18 2100  NA 142 135  K 3.8 3.7  CL 100 94*  CO2 29 29  GLUCOSE 91 195*  BUN 26* 12  CREATININE 1.60* 1.31*  CALCIUM 8.4* 8.3*   Liver Function Tests: No results for input(s): AST, ALT, ALKPHOS, BILITOT, PROT, ALBUMIN in the last 168 hours. No results for input(s): LIPASE, AMYLASE in the last 168 hours. No results for input(s): AMMONIA in the last 168 hours. CBC: Recent Labs  Lab 07/05/18 1219 07/08/18 2100  WBC 9.6 2.9*  NEUTROABS 7.0 1.8  HGB 7.9* 5.8*  HCT 27.1* 20.7*  MCV 88 92.8  PLT 394 130*   Cardiac Enzymes:   No results for input(s): CKTOTAL, CKMB, CKMBINDEX, TROPONINI in the last 168 hours. BNP (last 3 results) Recent Labs    05/29/18 1052 06/24/18  2039 07/08/18 2100  BNP 1,218.8* 652.9* 669.1*    ProBNP (last 3 results) No results for input(s): PROBNP in the last 8760 hours.  CBG: Recent Labs  Lab 07/08/18 2100 07/09/18 0029 07/09/18 0724  GLUCAP 194* 190* 165*    No results found for this or any previous visit (from the past 240 hour(s)).   Studies: Dg Chest Portable 1 View  Result Date: 07/08/2018 CLINICAL DATA:  Mild dyspnea, began feeling cold 1 hour ago, a typical feeling for exacerbation of her anemia, chronic cough shortness of breath, history coronary artery disease, CHF, diabetes mellitus, hypertension, former smoker EXAM: PORTABLE CHEST 1 VIEW COMPARISON:  Portable exam 2019 hours compared to 06/26/2018 FINDINGS: Enlargement of cardiac silhouette, chronic. Pulmonary vascular congestion. Mediastinal contours normal. Pleural calcification and scarring in the LEFT hemithorax unchanged. Mild diffuse BILATERAL interstitial infiltrates consistent  with pulmonary edema. No pleural effusion or pneumothorax. Bones unremarkable. IMPRESSION: Enlargement of cardiac silhouette with pulmonary vascular congestion and mild pulmonary edema. Chronic scarring and pleural calcification of the LEFT hemithorax unchanged. Electronically Signed   By: Lavonia Dana M.D.   On: 07/08/2018 20:31    Scheduled Meds: . carvedilol  3.125 mg Oral BID WC  . furosemide  40 mg Oral Daily  . hydrocerin  1 application Topical BID  . insulin aspart  0-9 Units Subcutaneous TID WC  . insulin detemir  9 Units Subcutaneous Daily  . mometasone-formoterol  2 puff Inhalation BID  . pantoprazole  40 mg Oral Daily  . rosuvastatin  40 mg Oral q1800    Continuous Infusions:   Time spent: 34mins I have personally reviewed and interpreted on  07/09/2018 daily labs, tele strips, imagings as discussed above under date review session and assessment and plans.  I reviewed all nursing notes, pharmacy notes,  vitals, pertinent old records  I have discussed plan of care as described above with RN , patient  on 07/09/2018   Florencia Reasons MD, PhD  [facility] Pager [phone]. If 7PM-7AM, please contact night-coverage at [email or web address], password [facility] 07/09/2018, 8:33 AM  LOS: 0 days

## 2018-07-09 NOTE — Progress Notes (Signed)
CSW provided medicaid transportation packet along with application. Patient will need to complete and follow up with DSS. This service can help get her to and from her medical appointments.   CSW signing off.   Domenic Schwab, MSW, [place]

## 2018-07-09 NOTE — H&P (View-Only) (Signed)
[facility] Consult  Referring Provider: Florencia Reasons, MD Primary Care Physician:  Antony Blackbird, MD Primary Gastroenterologist: Dr.Mag[facility]  Reason for Consultation:  Asthmatic anemia  HPI: Tamara Silva is a 58 y.o. female was admitted yesterday with complains of chills, lightheadedness and feeling excessively cold. In the ER she was found to have a hemoglobin of 5.8,without obvious melena or hematochezia. Patient is on baby aspirin and denies use of other NSAIDs, antiplatelets or anticoagulants. She has difficulty swallowing meats otherwise denies food getting stuck while eating or pain on swallowing. She has regular bowel movements usually once a day, stools are brown. Denies abdominal pain, unintentional weight loss, nausea or vomiting. Last EGD and colonoscopy was in 2015, she had an adenoma removed, otherwise noted to have diverticulosis.   Past Medical History:  Diagnosis Date  . Asthma   . CAD (coronary artery disease)   . Chronic combined systolic (congestive) and diastolic (congestive) heart failure ([place])   . Chronic iron deficiency anemia   . Diabetes mellitus without complication ([place])   . DOE (dyspnea on exertion)   . Edema of both legs   . Hepatic steatosis   . Hypertension   . Pulmonary emboli ([place]) 2014    Past Surgical History:  Procedure Laterality Date  . COLONOSCOPY WITH PROPOFOL N/A 08/03/2013   Procedure: COLONOSCOPY WITH PROPOFOL;  Surgeon: Jeryl Columbia, MD;  Location: [facility];  Service: Endoscopy;  Laterality: N/A;  . CORONARY ANGIOPLASTY WITH STENT PLACEMENT     CAD in 2006 x 2 and 2009 2 more- place din REx in [place] [facility] [place] [facility]  . CORONARY ANGIOPLASTY WITH STENT PLACEMENT  10/07/2013   Xience Alpine DES 2.75  mm x 15  mm  . ESOPHAGOGASTRODUODENOSCOPY (EGD) WITH PROPOFOL N/A 08/03/2013   Procedure: ESOPHAGOGASTRODUODENOSCOPY (EGD) WITH PROPOFOL;  Surgeon: Jeryl Columbia, MD;  Location: [facility];  Service: Endoscopy;  Laterality: N/A;   . LEFT HEART CATHETERIZATION WITH CORONARY ANGIOGRAM N/A 10/07/2013   Procedure: LEFT HEART CATHETERIZATION WITH CORONARY ANGIOGRAM;  Surgeon: Leonie Man, MD;  Location: [facility] CATH LAB;  Service: Cardiovascular;  Laterality: N/A;    Prior to Admission medications   Medication Sig Start Date End Date Taking? Authorizing Provider  albuterol (PROVENTIL HFA;VENTOLIN HFA) 108 (90 Base) MCG/ACT inhaler Inhale 2 puffs into the lungs every 6 (six) hours as needed for wheezing or shortness of breath. 06/28/18  Yes Shelly Coss, MD  aspirin 81 MG tablet Take 1 tablet (81 mg total) by mouth daily. 10/09/13  Yes Barrett, Evelene Croon, PA-C  budesonide-formoterol (SYMBICORT) 80-4.5 MCG/ACT inhaler Inhale 2 puffs into the lungs 2 times daily at 12 noon and 4 pm. 06/28/18  Yes Shelly Coss, MD  carvedilol (COREG) 3.125 MG tablet Take 1 tablet (3.125 mg total) by mouth 2 (two) times daily with a meal. 06/22/18  Yes Love, Ivan Anchors, PA-C  ferrous gluconate (FERGON) 324 MG tablet Take 1 tablet (324 mg total) by mouth 2 (two) times daily with a meal. 06/28/18  Yes Adhikari, Amrit, MD  furosemide (LASIX) 40 MG tablet Take 1 tablet (40 mg total) by mouth daily. 06/28/18  Yes Shelly Coss, MD  hydrocerin (EUCERIN) CREA Apply 1 application topically 2 (two) times daily. 06/22/18  Yes Love, Ivan Anchors, PA-C  Insulin Detemir (LEVEMIR FLEXTOUCH) 100 UNIT/ML Pen Inject 18 Units into the skin daily. Patient taking differently: Inject 18 Units into the skin at bedtime.  06/22/18  Yes Love, Ivan Anchors, PA-C  ipratropium-albuterol (DUONEB) 0.5-2.5 (3) MG/3ML  SOLN Use nebulizer to inhale contents of one vial every 6 hours as needed for shortness of breath, wheezing 06/22/18  Yes Love, Ivan Anchors, PA-C  lactulose (CHRONULAC) 10 GM/15ML solution Take 45 mLs (30 g total) by mouth 2 (two) times daily. 06/22/18  Yes Love, Ivan Anchors, PA-C  NEEDLE, DISP, 26 G 26G X 1/2" MISC 1 application by Does not apply route daily. 06/22/18  Yes Love, Ivan Anchors,  PA-C  nitroGLYCERIN (NITROSTAT) 0.4 MG SL tablet Place 1 tablet (0.4 mg total) under the tongue every 5 (five) minutes as needed for chest pain. 01/27/18  Yes Fulp, Cammie, MD  omeprazole (PRILOSEC) 40 MG capsule Take 1 capsule (40 mg total) by mouth daily. 06/22/18  Yes Love, Ivan Anchors, PA-C  potassium chloride 20 MEQ TBCR Take 20 mEq by mouth daily for 7 days. 06/28/18 07/09/18 Yes Shelly Coss, MD  Prasugrel HCl (EFFIENT PO) Take 1 tablet by mouth daily.   Yes [provider]  rosuvastatin (CRESTOR) 40 MG tablet Take 1 tablet (40 mg total) by mouth daily at 6 PM. 06/22/18  Yes Love, Ivan Anchors, PA-C  SPIRONOLACTONE PO Take 1 tablet by mouth daily.   Yes [provider]    Current Facility-Administered Medications  Medication Dose Route Frequency Provider Last Rate Last Dose  . acetaminophen (TYLENOL) tablet 650 mg  650 mg Oral Q6H PRN Ivor Costa, MD       Or  . acetaminophen (TYLENOL) suppository 650 mg  650 mg Rectal Q6H PRN Ivor Costa, MD      . albuterol (PROVENTIL) (2.5 MG/3ML) 0.083% nebulizer solution 3 mL  3 mL Inhalation Q6H PRN Ivor Costa, MD   3 mL at 07/09/18 0346  . carvedilol (COREG) tablet 3.125 mg  3.125 mg Oral BID WC Ivor Costa, MD   3.125 mg at 07/09/18 0810  . furosemide (LASIX) tablet 40 mg  40 mg Oral Daily Ivor Costa, MD   40 mg at 07/09/18 0945  . hydrALAZINE (APRESOLINE) injection 5 mg  5 mg Intravenous Q2H PRN Ivor Costa, MD      . hydrocerin (EUCERIN) cream 1 application  1 application Topical BID Ivor Costa, MD   1 application at 44/81/85 [phone]  . insulin aspart (novoLOG) injection 0-9 Units  0-9 Units Subcutaneous TID WC Ivor Costa, MD   2 Units at 07/09/18 1211  . insulin detemir (LEVEMIR) injection 9 Units  9 Units Subcutaneous Daily Ivor Costa, MD   9 Units at 07/09/18 [phone]  . ipratropium-albuterol (DUONEB) 0.5-2.5 (3) MG/3ML nebulizer solution 3 mL  3 mL Nebulization Q6H PRN Ivor Costa, MD      . lactulose (CHRONULAC) 10 GM/15ML solution 30 g  30 g  Oral BID PRN Ivor Costa, MD      . mometasone-formoterol (DULERA) 100-5 MCG/ACT inhaler 2 puff  2 puff Inhalation BID Ivor Costa, MD   2 puff at 07/09/18 0846  . nitroGLYCERIN (NITROSTAT) SL tablet 0.4 mg  0.4 mg Sublingual Q5 min PRN Ivor Costa, MD      . ondansetron [facility]) tablet 4 mg  4 mg Oral Q6H PRN Ivor Costa, MD       Or  . ondansetron (ZOFRAN) injection 4 mg  4 mg Intravenous Q6H PRN Ivor Costa, MD      . pantoprazole (PROTONIX) EC tablet 40 mg  40 mg Oral Daily Ivor Costa, MD   40 mg at 07/09/18 0945  . phenol (CHLORASEPTIC) mouth spray 1 spray  1 spray Mouth/Throat PRN Blaine Hamper,  Soledad Gerlach, MD      . polyethylene glycol-electrolytes (NuLYTELY/GoLYTELY) solution 4,000 mL  4,000 mL Oral Once [name], MD      . rosuvastatin (CRESTOR) tablet 40 mg  40 mg Oral q1800 Ivor Costa, MD      . senna-docusate (Senokot-S) tablet 1 tablet  1 tablet Oral QHS PRN Ivor Costa, MD        Allergies as of 07/08/2018 - Review Complete 07/08/2018  Allergen Reaction Noted  . Metolazone Other (See Comments) 07/10/2013  . Plavix [clopidogrel bisulfate] Other (See Comments) 10/08/2013  . Ibuprofen Other (See Comments) 01/23/2018  . Nsaids Other (See Comments) 11/27/2012    Family History  Problem Relation Age of Onset  . Stroke Mother   . Hypertension Mother   . Colon cancer Father   . Diabetes Father   . Heart attack Father   . Sarcoidosis Other   . Breast cancer Paternal Aunt   . Emphysema Brother   . Lung disease Brother        Unknown type, 3 brothers, one with liver and lung disease  . Asthma Paternal Aunt     Social History   Socioeconomic History  . Marital status: Single    Spouse name: Not on file  . Number of children: 1  . Years of education: Not on file  . Highest education level: Not on file  Occupational History  . Not on file  Social Needs  . Financial resource strain: Somewhat hard  . Food insecurity:    Worry: Sometimes true    Inability: Sometimes true  .  Transportation needs:    Medical: Yes    Non-medical: Yes  Tobacco Use  . Smoking status: Former Smoker    Packs/day: 0.50    Years: 20.00    Pack years: 10.00    Types: Cigarettes    Last attempt to quit: 04/30/2014    Years since quitting: 4.1  . Smokeless tobacco: Never Used  . Tobacco comment: smoked off and on x 20 years  Substance and Sexual Activity  . Alcohol use: No    Alcohol/week: 0.0 standard drinks  . Drug use: No  . Sexual activity: Not Currently  Lifestyle  . Physical activity:    Days per week: 0 days    Minutes per session: 0 min  . Stress: Rather much  Relationships  . Social connections:    Talks on phone: Twice a week    Gets together: Once a week    Attends religious service: 1 to 4 times per year    Active member of club or organization: No    Attends meetings of clubs or organizations: Never    Relationship status: Living with partner  . Intimate partner violence:    Fear of current or ex partner: Patient refused    Emotionally abused: Patient refused    Physically abused: Patient refused    Forced sexual activity: Patient refused  Other Topics Concern  . Not on file  Social History Narrative   Origibnally from [place]   Most recently from [place]   Daughter lives in town      On Social security to CHF, COPD, CAD    Review of Systems: Positive for: GI: Described in detail in HPI.    Gen: fatigue, weakness, malaise,Denies any fever, chills, rigors, night sweats, anorexia,  involuntary weight loss, and sleep disorder CV: Denies chest pain, angina, palpitations, syncope, orthopnea, PND, peripheral edema, and claudication. Resp: Denies dyspnea, cough, sputum,  wheezing, coughing up blood. GU : Denies urinary burning, blood in urine, urinary frequency, urinary hesitancy, nocturnal urination, and urinary incontinence. MS: Denies joint pain or swelling.  Denies muscle weakness, cramps, atrophy.  Derm: Denies rash, itching, oral ulcerations, hives,  unhealing ulcers.  Psych: Denies depression, anxiety, memory loss, suicidal ideation, hallucinations,  and confusion. Heme: Denies bruising, bleeding, and enlarged lymph nodes. Neuro:  Denies any headaches, dizziness, paresthesias. Endo:  DM, Denies any problems with adrenal function.  Physical Exam: Vital signs in last 24 hours: Temp:  [97.8 F (36.6 C)-99.3 F (37.4 C)] 98.6 F (37 C) (02/09 0938) Pulse Rate:  [78-91] 78 (02/09 0938) Resp:  [16-27] (P) 20 (02/09 0938) BP: (109-141)/(54-87) 131/70 (02/09 0938) SpO2:  [99 %-100 %] 100 % (02/09 0938) FiO2 (%):  [16 %-18 %] 16 % (02/09 0500) Weight:  [136.5 kg-189.7 kg] 189.7 kg (02/09 0033)    General:   Alert,  Well-developed, obese, pleasant and cooperative in NAD Head:  Normocephalic and atraumatic. Eyes:  Sclera clear, no icterus.  Prominent pallor Ears:  Normal auditory acuity. Nose:  No deformity, discharge,  or lesions, on oxygen via nasal cannula at 3 L/m Mouth:  No deformity or lesions.  Oropharynx pink & moist. Neck:  Supple; no masses or thyromegaly. Lungs:  Clear throughout to auscultation.   No wheezes, crackles, or rhonchi. No acute distress. Heart:  Regular rate and rhythm; no murmurs, clicks, rubs,  or gallops. Extremities:  Mild bipedal pitting edema. Neurologic:  Alert and  oriented x4;  grossly normal neurologically. Skin:  Intact without significant lesions or rashes. Psych:  Alert and cooperative. Normal mood and affect. Abdomen:  Soft, nontender and nondistended. No masses, hepatosplenomegaly or hernias noted. Normal bowel sounds, without guarding, and without rebound.         Lab Results: Recent Labs    07/08/18 2100 07/09/18 1149  WBC 2.9* 7.9  HGB 5.8* 9.2*  HCT 20.7* 32.1*  PLT 130* PLATELET CLUMPS NOTED ON SMEAR, COUNT APPEARS ADEQUATE   BMET Recent Labs    07/08/18 2100 07/09/18 1149  NA 135 137  K 3.7 3.6  CL 94* 97*  CO2 29 33*  GLUCOSE 195* 205*  BUN 12 10  CREATININE 1.31*  1.32*  CALCIUM 8.3* 8.6*   LFT Recent Labs    07/09/18 1149  PROT 8.2*  ALBUMIN 2.5*  AST 26  ALT 19  ALKPHOS 120  BILITOT 1.8*  BILIDIR 0.7*  IBILI 1.1*   PT/INR Recent Labs    07/08/18 2316  LABPROT 15.1  INR 1.20    Studies/Results: Dg Chest Portable 1 View  Result Date: 07/08/2018 CLINICAL DATA:  Mild dyspnea, began feeling cold 1 hour ago, a typical feeling for exacerbation of her anemia, chronic cough shortness of breath, history coronary artery disease, CHF, diabetes mellitus, hypertension, former smoker EXAM: PORTABLE CHEST 1 VIEW COMPARISON:  Portable exam 2019 hours compared to 06/26/2018 FINDINGS: Enlargement of cardiac silhouette, chronic. Pulmonary vascular congestion. Mediastinal contours normal. Pleural calcification and scarring in the LEFT hemithorax unchanged. Mild diffuse BILATERAL interstitial infiltrates consistent with pulmonary edema. No pleural effusion or pneumothorax. Bones unremarkable. IMPRESSION: Enlargement of cardiac silhouette with pulmonary vascular congestion and mild pulmonary edema. Chronic scarring and pleural calcification of the LEFT hemithorax unchanged. Electronically Signed   By: Lavonia Dana M.D.   On: 07/08/2018 20:31    Impression: Symptomatic anemia without obvious melena or hematochezia,FOBT negative on 05/29/18 Status post 2 units PRBC transfusion today Hemodynamically stable  Multiple  comorbidities-hypertension, dyslipidemia, diabetes, COPD, stroke, coronary artery disease, CHF with ejection fraction of 40%, history of pulmonary embolism, chronic kidney disease stage III, obstructive sleep apnea on CPAP  History of adenoma removal in 2015 Sigmoid diverticulosis noted on CAT scan from 12/19 with small to moderate volume ascites, moderate anasarca and diffuse hepatic steatosis  Plan: Clear liquid diet, nothing by mouth past midnight, plan diagnostic EGD and colonoscopy in a.m. The risks and the benefits of the procedure were  discussed with the patient in details, she understands and verbalizes consents.   LOS: 0 days   [name], M.D.  07/09/2018, 3:04 PM  Pager [phone] If no answer or after 5 PM call [phone]

## 2018-07-09 NOTE — Consult Note (Signed)
[facility] Consult  Referring Provider: Florencia Reasons, MD Primary Care Physician:  Antony Blackbird, MD Primary Gastroenterologist: Dr.Magod/[facility] Fabienne Bruns  Reason for Consultation:  Asthmatic anemia  HPI: Tamara Silva is a 58 y.o. female was admitted yesterday with complains of chills, lightheadedness and feeling excessively cold. In the ER she was found to have a hemoglobin of 5.8,without obvious melena or hematochezia. Patient is on baby aspirin and denies use of other NSAIDs, antiplatelets or anticoagulants. She has difficulty swallowing meats otherwise denies food getting stuck while eating or pain on swallowing. She has regular bowel movements usually once a day, stools are brown. Denies abdominal pain, unintentional weight loss, nausea or vomiting. Last EGD and colonoscopy was in 2015, she had an adenoma removed, otherwise noted to have diverticulosis.   Past Medical History:  Diagnosis Date  . Asthma   . CAD (coronary artery disease)   . Chronic combined systolic (congestive) and diastolic (congestive) heart failure ([place])   . Chronic iron deficiency anemia   . Diabetes mellitus without complication ([place])   . DOE (dyspnea on exertion)   . Edema of both legs   . Hepatic steatosis   . Hypertension   . Pulmonary emboli ([place]) 2014    Past Surgical History:  Procedure Laterality Date  . COLONOSCOPY WITH PROPOFOL N/A 08/03/2013   Procedure: COLONOSCOPY WITH PROPOFOL;  Surgeon: Jeryl Columbia, MD;  Location: [facility];  Service: Endoscopy;  Laterality: N/A;  . CORONARY ANGIOPLASTY WITH STENT PLACEMENT     CAD in 2006 x 2 and 2009 2 more- place din REx in [place] and [place] [facility]  . CORONARY ANGIOPLASTY WITH STENT PLACEMENT  10/07/2013   Xience [place] DES 2.75  mm x 15  mm  . ESOPHAGOGASTRODUODENOSCOPY (EGD) WITH PROPOFOL N/A 08/03/2013   Procedure: ESOPHAGOGASTRODUODENOSCOPY (EGD) WITH PROPOFOL;  Surgeon: Jeryl Columbia, MD;  Location: [facility];  Service: Endoscopy;  Laterality: N/A;   . LEFT HEART CATHETERIZATION WITH CORONARY ANGIOGRAM N/A 10/07/2013   Procedure: LEFT HEART CATHETERIZATION WITH CORONARY ANGIOGRAM;  Surgeon: Leonie Man, MD;  Location: [facility] CATH LAB;  Service: Cardiovascular;  Laterality: N/A;    Prior to Admission medications   Medication Sig Start Date End Date Taking? Authorizing Provider  albuterol (PROVENTIL HFA;VENTOLIN HFA) 108 (90 Base) MCG/ACT inhaler Inhale 2 puffs into the lungs every 6 (six) hours as needed for wheezing or shortness of breath. 06/28/18  Yes Shelly Coss, MD  aspirin 81 MG tablet Take 1 tablet (81 mg total) by mouth daily. 10/09/13  Yes Barrett, Evelene Croon, PA-C  budesonide-formoterol (SYMBICORT) 80-4.5 MCG/ACT inhaler Inhale 2 puffs into the lungs 2 times daily at 12 noon and 4 pm. 06/28/18  Yes Shelly Coss, MD  carvedilol (COREG) 3.125 MG tablet Take 1 tablet (3.125 mg total) by mouth 2 (two) times daily with a meal. 06/22/18  Yes Love, Ivan Anchors, PA-C  ferrous gluconate (FERGON) 324 MG tablet Take 1 tablet (324 mg total) by mouth 2 (two) times daily with a meal. 06/28/18  Yes Adhikari, Amrit, MD  furosemide (LASIX) 40 MG tablet Take 1 tablet (40 mg total) by mouth daily. 06/28/18  Yes Shelly Coss, MD  hydrocerin (EUCERIN) CREA Apply 1 application topically 2 (two) times daily. 06/22/18  Yes Love, Ivan Anchors, PA-C  Insulin Detemir (LEVEMIR FLEXTOUCH) 100 UNIT/ML Pen Inject 18 Units into the skin daily. Patient taking differently: Inject 18 Units into the skin at bedtime.  06/22/18  Yes Love, Ivan Anchors, PA-C  ipratropium-albuterol (DUONEB) 0.5-2.5 (3) MG/3ML  SOLN Use nebulizer to inhale contents of one vial every 6 hours as needed for shortness of breath, wheezing 06/22/18  Yes Love, Ivan Anchors, PA-C  lactulose (CHRONULAC) 10 GM/15ML solution Take 45 mLs (30 g total) by mouth 2 (two) times daily. 06/22/18  Yes Love, Ivan Anchors, PA-C  NEEDLE, DISP, 26 G 26G X 1/2" MISC 1 application by Does not apply route daily. 06/22/18  Yes Love, Ivan Anchors,  PA-C  nitroGLYCERIN (NITROSTAT) 0.4 MG SL tablet Place 1 tablet (0.4 mg total) under the tongue every 5 (five) minutes as needed for chest pain. 01/27/18  Yes Fulp, Cammie, MD  omeprazole (PRILOSEC) 40 MG capsule Take 1 capsule (40 mg total) by mouth daily. 06/22/18  Yes Love, Ivan Anchors, PA-C  potassium chloride 20 MEQ TBCR Take 20 mEq by mouth daily for 7 days. 06/28/18 07/09/18 Yes Shelly Coss, MD  Prasugrel HCl (EFFIENT PO) Take 1 tablet by mouth daily.   Yes [provider]  rosuvastatin (CRESTOR) 40 MG tablet Take 1 tablet (40 mg total) by mouth daily at 6 PM. 06/22/18  Yes Love, Ivan Anchors, PA-C  SPIRONOLACTONE PO Take 1 tablet by mouth daily.   Yes [provider]    Current Facility-Administered Medications  Medication Dose Route Frequency Provider Last Rate Last Dose  . acetaminophen (TYLENOL) tablet 650 mg  650 mg Oral Q6H PRN Ivor Costa, MD       Or  . acetaminophen (TYLENOL) suppository 650 mg  650 mg Rectal Q6H PRN Ivor Costa, MD      . albuterol (PROVENTIL) (2.5 MG/3ML) 0.083% nebulizer solution 3 mL  3 mL Inhalation Q6H PRN Ivor Costa, MD   3 mL at 07/09/18 0346  . carvedilol (COREG) tablet 3.125 mg  3.125 mg Oral BID WC Ivor Costa, MD   3.125 mg at 07/09/18 0810  . furosemide (LASIX) tablet 40 mg  40 mg Oral Daily Ivor Costa, MD   40 mg at 07/09/18 0945  . hydrALAZINE (APRESOLINE) injection 5 mg  5 mg Intravenous Q2H PRN Ivor Costa, MD      . hydrocerin (EUCERIN) cream 1 application  1 application Topical BID Ivor Costa, MD   1 application at 16/10/96 [phone]  . insulin aspart (novoLOG) injection 0-9 Units  0-9 Units Subcutaneous TID WC Ivor Costa, MD   2 Units at 07/09/18 1211  . insulin detemir (LEVEMIR) injection 9 Units  9 Units Subcutaneous Daily Ivor Costa, MD   9 Units at 07/09/18 [phone]  . ipratropium-albuterol (DUONEB) 0.5-2.5 (3) MG/3ML nebulizer solution 3 mL  3 mL Nebulization Q6H PRN Ivor Costa, MD      . lactulose (CHRONULAC) 10 GM/15ML solution 30 g  30 g  Oral BID PRN Ivor Costa, MD      . mometasone-formoterol (DULERA) 100-5 MCG/ACT inhaler 2 puff  2 puff Inhalation BID Ivor Costa, MD   2 puff at 07/09/18 0846  . nitroGLYCERIN (NITROSTAT) SL tablet 0.4 mg  0.4 mg Sublingual Q5 min PRN Ivor Costa, MD      . ondansetron [facility]) tablet 4 mg  4 mg Oral Q6H PRN Ivor Costa, MD       Or  . ondansetron (ZOFRAN) injection 4 mg  4 mg Intravenous Q6H PRN Ivor Costa, MD      . pantoprazole (PROTONIX) EC tablet 40 mg  40 mg Oral Daily Ivor Costa, MD   40 mg at 07/09/18 0945  . phenol (CHLORASEPTIC) mouth spray 1 spray  1 spray Mouth/Throat PRN Blaine Hamper,  Soledad Gerlach, MD      . polyethylene glycol-electrolytes (NuLYTELY/GoLYTELY) solution 4,000 mL  4,000 mL Oral Once [name], MD      . rosuvastatin (CRESTOR) tablet 40 mg  40 mg Oral q1800 Ivor Costa, MD      . senna-docusate (Senokot-S) tablet 1 tablet  1 tablet Oral QHS PRN Ivor Costa, MD        Allergies as of 07/08/2018 - Review Complete 07/08/2018  Allergen Reaction Noted  . Metolazone Other (See Comments) 07/10/2013  . Plavix [clopidogrel bisulfate] Other (See Comments) 10/08/2013  . Ibuprofen Other (See Comments) 01/23/2018  . Nsaids Other (See Comments) 11/27/2012    Family History  Problem Relation Age of Onset  . Stroke Mother   . Hypertension Mother   . Colon cancer Father   . Diabetes Father   . Heart attack Father   . Sarcoidosis Other   . Breast cancer Paternal Aunt   . Emphysema Brother   . Lung disease Brother        Unknown type, 3 brothers, one with liver and lung disease  . Asthma Paternal Aunt     Social History   Socioeconomic History  . Marital status: Single    Spouse name: Not on file  . Number of children: 1  . Years of education: Not on file  . Highest education level: Not on file  Occupational History  . Not on file  Social Needs  . Financial resource strain: Somewhat hard  . Food insecurity:    Worry: Sometimes true    Inability: Sometimes true  .  Transportation needs:    Medical: Yes    Non-medical: Yes  Tobacco Use  . Smoking status: Former Smoker    Packs/day: 0.50    Years: 20.00    Pack years: 10.00    Types: Cigarettes    Last attempt to quit: 04/30/2014    Years since quitting: 4.1  . Smokeless tobacco: Never Used  . Tobacco comment: smoked off and on x 20 years  Substance and Sexual Activity  . Alcohol use: No    Alcohol/week: 0.0 standard drinks  . Drug use: No  . Sexual activity: Not Currently  Lifestyle  . Physical activity:    Days per week: 0 days    Minutes per session: 0 min  . Stress: Rather much  Relationships  . Social connections:    Talks on phone: Twice a week    Gets together: Once a week    Attends religious service: 1 to 4 times per year    Active member of club or organization: No    Attends meetings of clubs or organizations: Never    Relationship status: Living with partner  . Intimate partner violence:    Fear of current or ex partner: Patient refused    Emotionally abused: Patient refused    Physically abused: Patient refused    Forced sexual activity: Patient refused  Other Topics Concern  . Not on file  Social History Narrative   Origibnally from [place]   Most recently from [place]   Daughter lives in town      On Social security to CHF, COPD, CAD    Review of Systems: Positive for: GI: Described in detail in HPI.    Gen: fatigue, weakness, malaise,Denies any fever, chills, rigors, night sweats, anorexia,  involuntary weight loss, and sleep disorder CV: Denies chest pain, angina, palpitations, syncope, orthopnea, PND, peripheral edema, and claudication. Resp: Denies dyspnea, cough, sputum,  wheezing, coughing up blood. GU : Denies urinary burning, blood in urine, urinary frequency, urinary hesitancy, nocturnal urination, and urinary incontinence. MS: Denies joint pain or swelling.  Denies muscle weakness, cramps, atrophy.  Derm: Denies rash, itching, oral ulcerations, hives,  unhealing ulcers.  Psych: Denies depression, anxiety, memory loss, suicidal ideation, hallucinations,  and confusion. Heme: Denies bruising, bleeding, and enlarged lymph nodes. Neuro:  Denies any headaches, dizziness, paresthesias. Endo:  DM, Denies any problems with adrenal function.  Physical Exam: Vital signs in last 24 hours: Temp:  [97.8 F (36.6 C)-99.3 F (37.4 C)] 98.6 F (37 C) (02/09 0938) Pulse Rate:  [78-91] 78 (02/09 0938) Resp:  [16-27] (P) 20 (02/09 0938) BP: (109-141)/(54-87) 131/70 (02/09 0938) SpO2:  [99 %-100 %] 100 % (02/09 0938) FiO2 (%):  [16 %-18 %] 16 % (02/09 0500) Weight:  [136.5 kg-189.7 kg] 189.7 kg (02/09 0033)    General:   Alert,  Well-developed, obese, pleasant and cooperative in NAD Head:  Normocephalic and atraumatic. Eyes:  Sclera clear, no icterus.  Prominent pallor Ears:  Normal auditory acuity. Nose:  No deformity, discharge,  or lesions, on oxygen via nasal cannula at 3 L/m Mouth:  No deformity or lesions.  Oropharynx pink & moist. Neck:  Supple; no masses or thyromegaly. Lungs:  Clear throughout to auscultation.   No wheezes, crackles, or rhonchi. No acute distress. Heart:  Regular rate and rhythm; no murmurs, clicks, rubs,  or gallops. Extremities:  Mild bipedal pitting edema. Neurologic:  Alert and  oriented x4;  grossly normal neurologically. Skin:  Intact without significant lesions or rashes. Psych:  Alert and cooperative. Normal mood and affect. Abdomen:  Soft, nontender and nondistended. No masses, hepatosplenomegaly or hernias noted. Normal bowel sounds, without guarding, and without rebound.         Lab Results: Recent Labs    07/08/18 2100 07/09/18 1149  WBC 2.9* 7.9  HGB 5.8* 9.2*  HCT 20.7* 32.1*  PLT 130* PLATELET CLUMPS NOTED ON SMEAR, COUNT APPEARS ADEQUATE   BMET Recent Labs    07/08/18 2100 07/09/18 1149  NA 135 137  K 3.7 3.6  CL 94* 97*  CO2 29 33*  GLUCOSE 195* 205*  BUN 12 10  CREATININE 1.31*  1.32*  CALCIUM 8.3* 8.6*   LFT Recent Labs    07/09/18 1149  PROT 8.2*  ALBUMIN 2.5*  AST 26  ALT 19  ALKPHOS 120  BILITOT 1.8*  BILIDIR 0.7*  IBILI 1.1*   PT/INR Recent Labs    07/08/18 2316  LABPROT 15.1  INR 1.20    Studies/Results: Dg Chest Portable 1 View  Result Date: 07/08/2018 CLINICAL DATA:  Mild dyspnea, began feeling cold 1 hour ago, a typical feeling for exacerbation of her anemia, chronic cough shortness of breath, history coronary artery disease, CHF, diabetes mellitus, hypertension, former smoker EXAM: PORTABLE CHEST 1 VIEW COMPARISON:  Portable exam 2019 hours compared to 06/26/2018 FINDINGS: Enlargement of cardiac silhouette, chronic. Pulmonary vascular congestion. Mediastinal contours normal. Pleural calcification and scarring in the LEFT hemithorax unchanged. Mild diffuse BILATERAL interstitial infiltrates consistent with pulmonary edema. No pleural effusion or pneumothorax. Bones unremarkable. IMPRESSION: Enlargement of cardiac silhouette with pulmonary vascular congestion and mild pulmonary edema. Chronic scarring and pleural calcification of the LEFT hemithorax unchanged. Electronically Signed   By: Lavonia Dana M.D.   On: 07/08/2018 20:31    Impression: Symptomatic anemia without obvious melena or hematochezia,FOBT negative on 05/29/18 Status post 2 units PRBC transfusion today Hemodynamically stable  Multiple  comorbidities-hypertension, dyslipidemia, diabetes, COPD, stroke, coronary artery disease, CHF with ejection fraction of 40%, history of pulmonary embolism, chronic kidney disease stage III, obstructive sleep apnea on CPAP  History of adenoma removal in 2015 Sigmoid diverticulosis noted on CAT scan from 12/19 with small to moderate volume ascites, moderate anasarca and diffuse hepatic steatosis  Plan: Clear liquid diet, nothing by mouth past midnight, plan diagnostic EGD and colonoscopy in a.m. The risks and the benefits of the procedure were  discussed with the patient in details, she understands and verbalizes consents.   LOS: 0 days   [name], M.D.  07/09/2018, 3:04 PM  Pager [phone] If no answer or after 5 PM call [phone]

## 2018-07-10 ENCOUNTER — Other Ambulatory Visit: Payer: Self-pay | Admitting: Family Medicine

## 2018-07-10 ENCOUNTER — Telehealth (HOSPITAL_COMMUNITY): Payer: Self-pay

## 2018-07-10 ENCOUNTER — Observation Stay (HOSPITAL_COMMUNITY): Payer: Medicaid Other | Admitting: Certified Registered"

## 2018-07-10 ENCOUNTER — Encounter (HOSPITAL_COMMUNITY)
Admission: EM | Disposition: A | Discharge status: Home / Self Care | Payer: Self-pay | Source: Home / Self Care | Attending: Emergency Medicine

## 2018-07-10 ENCOUNTER — Encounter (HOSPITAL_COMMUNITY): Payer: Self-pay

## 2018-07-10 DIAGNOSIS — E1165 Type 2 diabetes mellitus with hyperglycemia: Secondary | ICD-10-CM

## 2018-07-10 DIAGNOSIS — N182 Chronic kidney disease, stage 2 (mild): Principal | ICD-10-CM

## 2018-07-10 DIAGNOSIS — D509 Iron deficiency anemia, unspecified: Secondary | ICD-10-CM | POA: Diagnosis not present

## 2018-07-10 DIAGNOSIS — E1122 Type 2 diabetes mellitus with diabetic chronic kidney disease: Secondary | ICD-10-CM

## 2018-07-10 DIAGNOSIS — K319 Disease of stomach and duodenum, unspecified: Secondary | ICD-10-CM | POA: Diagnosis not present

## 2018-07-10 DIAGNOSIS — Z9861 Coronary angioplasty status: Secondary | ICD-10-CM

## 2018-07-10 DIAGNOSIS — K2980 Duodenitis without bleeding: Secondary | ICD-10-CM | POA: Diagnosis not present

## 2018-07-10 DIAGNOSIS — N179 Acute kidney failure, unspecified: Secondary | ICD-10-CM | POA: Diagnosis not present

## 2018-07-10 DIAGNOSIS — Z794 Long term (current) use of insulin: Principal | ICD-10-CM

## 2018-07-10 DIAGNOSIS — IMO0002 Reserved for concepts with insufficient information to code with codable children: Secondary | ICD-10-CM

## 2018-07-10 DIAGNOSIS — D649 Anemia, unspecified: Secondary | ICD-10-CM | POA: Diagnosis not present

## 2018-07-10 DIAGNOSIS — I2510 Atherosclerotic heart disease of native coronary artery without angina pectoris: Secondary | ICD-10-CM

## 2018-07-10 DIAGNOSIS — Z9989 Dependence on other enabling machines and devices: Secondary | ICD-10-CM | POA: Diagnosis not present

## 2018-07-10 DIAGNOSIS — K5730 Diverticulosis of large intestine without perforation or abscess without bleeding: Secondary | ICD-10-CM | POA: Diagnosis not present

## 2018-07-10 HISTORY — PX: BIOPSY: SHX5522

## 2018-07-10 HISTORY — PX: ESOPHAGOGASTRODUODENOSCOPY (EGD) WITH PROPOFOL: SHX5813

## 2018-07-10 HISTORY — PX: COLONOSCOPY WITH PROPOFOL: SHX5780

## 2018-07-10 LAB — TYPE AND SCREEN
ABO/RH(D): B POS
Antibody Screen: NEGATIVE
Unit division: 0
Unit division: 0

## 2018-07-10 LAB — GLUCOSE, CAPILLARY
Glucose-Capillary: 90 mg/dL (ref 70–99)
Glucose-Capillary: 94 mg/dL (ref 70–99)
Glucose-Capillary: 204 mg/dL — ABNORMAL HIGH (ref 70–99)
Glucose-Capillary: 167 mg/dL — ABNORMAL HIGH (ref 70–99)

## 2018-07-10 LAB — HEPATIC FUNCTION PANEL
ALK PHOS: 111 U/L (ref 38–126)
ALT: 20 U/L (ref 0–44)
AST: 25 U/L (ref 15–41)
Albumin: 2.6 g/dL — ABNORMAL LOW (ref 3.5–5.0)
BILIRUBIN DIRECT: 0.6 mg/dL — AB (ref 0.0–0.2)
Indirect Bilirubin: 0.8 mg/dL (ref 0.3–0.9)
Total Bilirubin: 1.4 mg/dL — ABNORMAL HIGH (ref 0.3–1.2)
Total Protein: 8.5 g/dL — ABNORMAL HIGH (ref 6.5–8.1)

## 2018-07-10 LAB — BPAM RBC
Blood Product Expiration Date: 202002252359
Blood Product Expiration Date: 202002262359
ISSUE DATE / TIME: 202002090041
ISSUE DATE / TIME: 202002090626
Unit Type and Rh: 7300
Unit Type and Rh: 7300

## 2018-07-10 LAB — BASIC METABOLIC PANEL
Anion gap: 6 (ref 5–15)
BUN: 11 mg/dL (ref 6–20)
CO2: 33 mmol/L — AB (ref 22–32)
Calcium: 8.4 mg/dL — ABNORMAL LOW (ref 8.9–10.3)
Chloride: 99 mmol/L (ref 98–111)
Creatinine, Ser: 1.17 mg/dL — ABNORMAL HIGH (ref 0.44–1.00)
GFR calc Af Amer: 59 mL/min — ABNORMAL LOW (ref >60)
GFR calc non Af Amer: 51 mL/min — ABNORMAL LOW (ref >60)
GLUCOSE: 107 mg/dL — AB (ref 70–99)
Potassium: 3.6 mmol/L (ref 3.5–5.1)
Sodium: 138 mmol/L (ref 135–145)

## 2018-07-10 LAB — CBC
HCT: 30.7 % — ABNORMAL LOW (ref 36.0–46.0)
Hemoglobin: 8.8 g/dL — ABNORMAL LOW (ref 12.0–15.0)
MCH: 26.3 pg (ref 26.0–34.0)
MCHC: 28.7 g/dL — ABNORMAL LOW (ref 30.0–36.0)
MCV: 91.9 fL (ref 80.0–100.0)
Platelets: 347 10*3/uL (ref 150–400)
RBC: 3.34 MIL/uL — ABNORMAL LOW (ref 3.87–5.11)
RDW: 23.6 % — AB (ref 11.5–15.5)
WBC: 7.9 10*3/uL (ref 4.0–10.5)
nRBC: 0 % (ref 0.0–0.2)

## 2018-07-10 LAB — HAPTOGLOBIN: Haptoglobin: <10 mg/dL — ABNORMAL LOW (ref 33–346)

## 2018-07-10 SURGERY — ESOPHAGOGASTRODUODENOSCOPY (EGD) WITH PROPOFOL
Anesthesia: Monitor Anesthesia Care

## 2018-07-10 MED ORDER — PROPOFOL 500 MG/50ML IV EMUL
INTRAVENOUS | Status: DC | PRN
  Administered 2018-07-10: 125 ug/kg/min via INTRAVENOUS
  Administered 2018-07-10: 150 ug/kg/min via INTRAVENOUS

## 2018-07-10 MED ORDER — FERROUS GLUCONATE 324 (38 FE) MG PO TABS: 324.0000 mg | ORAL_TABLET | Freq: Every day | ORAL | 0 refills | Status: DC

## 2018-07-10 MED ORDER — GLYCOPYRROLATE 0.2 MG/ML IJ SOLN
INTRAMUSCULAR | Status: DC | PRN
  Administered 2018-07-10: 0.1 mg via INTRAVENOUS

## 2018-07-10 MED ORDER — POLYETHYLENE GLYCOL 3350 17 G PO PACK: 17.0000 g | PACK | ORAL | 0 refills | Status: DC

## 2018-07-10 MED ORDER — KETAMINE HCL 50 MG/5ML IJ SOSY
PREFILLED_SYRINGE | INTRAMUSCULAR | Status: AC
  Filled 2018-07-10: qty 5

## 2018-07-10 MED ORDER — PHENYLEPHRINE 40 MCG/ML (10ML) SYRINGE FOR IV PUSH (FOR BLOOD PRESSURE SUPPORT)
PREFILLED_SYRINGE | INTRAVENOUS | Status: DC | PRN
  Administered 2018-07-10: 120 ug via INTRAVENOUS
  Administered 2018-07-10 (×3): 80 ug via INTRAVENOUS
  Administered 2018-07-10 (×9): 120 ug via INTRAVENOUS

## 2018-07-10 MED ORDER — SENNOSIDES-DOCUSATE SODIUM 8.6-50 MG PO TABS: 1.0000 | ORAL_TABLET | Freq: Two times a day (BID) | ORAL | 0 refills | Status: DC

## 2018-07-10 MED ORDER — KETAMINE HCL 10 MG/ML IJ SOLN
INTRAMUSCULAR | Status: DC | PRN
  Administered 2018-07-10 (×4): 10 mg via INTRAVENOUS

## 2018-07-10 MED ORDER — POTASSIUM CHLORIDE ER 20 MEQ PO TBCR: 20.0000 meq | EXTENDED_RELEASE_TABLET | ORAL | 0 refills | Status: DC

## 2018-07-10 MED ORDER — LIDOCAINE HCL (CARDIAC) PF 100 MG/5ML IV SOSY
PREFILLED_SYRINGE | INTRAVENOUS | Status: DC | PRN
  Administered 2018-07-10: 80 mg via INTRAVENOUS

## 2018-07-10 MED ORDER — BUTAMBEN-TETRACAINE-BENZOCAINE 2-2-14 % EX AERO
INHALATION_SPRAY | CUTANEOUS | Status: DC | PRN
  Administered 2018-07-10: 2 via TOPICAL

## 2018-07-10 MED ORDER — LACTATED RINGERS IV SOLN
INTRAVENOUS | Status: DC
  Administered 2018-07-10: 1000 mL via INTRAVENOUS

## 2018-07-10 SURGICAL SUPPLY — 25 items

## 2018-07-10 NOTE — Telephone Encounter (Signed)
Spoke to Tamara Silva about initial home visit, patient advised she is currently hospitalized for anemia and would f/u after discharge.

## 2018-07-10 NOTE — Anesthesia Postprocedure Evaluation (Signed)
Anesthesia Post Note  Patient: Haely Leyland  Procedure(s) Performed: ESOPHAGOGASTRODUODENOSCOPY (EGD) WITH PROPOFOL (N/A ) COLONOSCOPY WITH PROPOFOL (N/A ) BIOPSY     Patient location during evaluation: PACU Anesthesia Type: MAC Level of consciousness: awake and alert Pain management: pain level controlled Vital Signs Assessment: post-procedure vital signs reviewed and stable Respiratory status: spontaneous breathing, nonlabored ventilation, respiratory function stable and patient connected to nasal cannula oxygen Cardiovascular status: stable and blood pressure returned to baseline Postop Assessment: no apparent nausea or vomiting Anesthetic complications: no    Last Vitals:  Vitals:   07/10/18 1120 07/10/18 1125  BP: 125/71 133/64  Pulse: 82 79  Resp: (!) 26 (!) 26  Temp:    SpO2: 100% 96%    Last Pain:  Vitals:   07/10/18 1125  TempSrc:   PainSc: 0-No pain                 Rockne Dearinger DAVID

## 2018-07-10 NOTE — Transfer of Care (Signed)
Immediate Anesthesia Transfer of Care Note  Patient: Tamara Silva  Procedure(s) Performed: ESOPHAGOGASTRODUODENOSCOPY (EGD) WITH PROPOFOL (N/A ) COLONOSCOPY WITH PROPOFOL (N/A ) BIOPSY  Patient Location: PACU  Anesthesia Type:MAC  Level of Consciousness: drowsy and patient cooperative  Airway & Oxygen Therapy: Patient Spontanous Breathing and Patient connected to nasal cannula oxygen  Post-op Assessment: Report given to RN and Post -op Vital signs reviewed and stable  Post vital signs: Reviewed and stable  Last Vitals:  Vitals Value Taken Time  BP 107/54 07/10/2018 11:12 AM  Temp    Pulse 80 07/10/2018 11:14 AM  Resp 30 07/10/2018 11:14 AM  SpO2 100 % 07/10/2018 11:14 AM  Vitals shown include unvalidated device data.  Last Pain:  Vitals:   07/10/18 1112  TempSrc: Oral  PainSc: 0-No pain         Complications: No apparent anesthesia complications

## 2018-07-10 NOTE — Progress Notes (Signed)
Informed by Case Manager Wendi while at [facility] that patient daughter was unable to transport patient home due to her own appointment she has to go to; that patient refusing to go home today even if a cab voucher is offered. Daughter reported she will pick up patient tomorrow 07/11/2018 at 0900. Dr Erlinda Hong made aware, per CM. Tanish Prien Ladora Daniel, BSN, RN

## 2018-07-10 NOTE — Discharge Summary (Addendum)
Discharge Summary  Tamara Silva VQM:[id number] DOB: 10-13-1960  PCP: Tamara Blackbird, MD  Admit date: 07/08/2018 Discharge date: 07/10/2018  Time spent: 10mins, more than 50% time spent on coordination of care.  Recommendations for Outpatient Follow-up:  1. F/u with PCP within a week  for hospital discharge follow up, repeat cbc/bmp at follow up. pcp to refer patient to hematology for anemia, pcp to follow up on final spep/light chain result. 2. F/u with cardiology for heart failure management (she has not followed up since recent hospitalization) , already has an appoint on 2/14 3. F/u with neurology for stroke management ( she has not followed up since recent cva) 4. F/u [facility] for repeat colonoscopy in 14months ( due to poor prep this hospitalization) 5. Home health arranged with PT/OT/Speech//aids/social worker and RN for heart failure and diabetes disease management 6. Continue home o2 and nightly bipap  Discharge Diagnoses:  Active Hospital Problems   Diagnosis Date Noted  . Symptomatic anemia 05/29/2018  . Dysarthria   . Morbid obesity with BMI of 60.0-69.9, adult ([place])   . BiPAP (biphasic positive airway pressure) dependence   . Acute renal failure superimposed on stage 3 chronic kidney disease ([place]) 07/08/2018  . Pancytopenia ([place]) 07/08/2018  . Type II diabetes mellitus with renal manifestations ([place]) 07/08/2018  . On home O2   . Iron deficiency anemia 01/27/2018  . Chronic respiratory failure with hypoxia ([place]) 01/27/2018  . Dyslipidemia 04/29/2016  . CAD S/P percutaneous coronary angioplasty - prior PCI to LAD; RCA PCI: new Xience Alpine DES 2.75 mm x 15 mm  10/07/2013  . Chronic combined systolic and diastolic heart failure ([place]) 07/10/2013  . Essential hypertension 02/08/2013  . COPD (chronic obstructive pulmonary disease) ([place]) 10/15/2012    Resolved Hospital Problems  No resolved problems to display.    Discharge Condition: stable  Diet recommendation: heart  healthy/carb modified  Filed Weights   07/08/18 1934 07/09/18 0033 07/09/18 2040  Weight: (!) 136.5 kg (!) 189.7 kg (!) 189 kg    History of present illness: (per admitting MD Dr Tamara Silva) PCP: Tamara Blackbird, MD   Patient coming from:  The patient is coming from home.  At baseline, pt is independent for most of ADL.        Chief Complaint: feeling cold and mild lightheadedness  HPI: Tamara Silva is a 58 y.o. female with medical history significant of iron deficiency anemia, hypertension, hyperlipidemia, diabetes mellitus, COPD, stroke, GERD, CAD, stent placement, CHF with EF of 40%, PE not on anticoagulants, CKD stage III, OSA on CPAP, who presents with feeling cold and mild lightheadedness.  Pt states that she has history of iron deficiency anemia, which required blood transfusion several times in the past.  Today she states that she feels cold and and has mild lightheadedness.  She feels like this is due to possible acute exacerbation of her anemia.  Patient states that she has chronic mild cough and shortness of breath due to COPD, which has not changed today.  Denies any chest pain.  No fever or chills.  Patient denies rectal bleeding, hematuria or bleeding anywhere. She states that she has dark stool occasionally. She is taking iron supplement.  No nausea vomiting, diarrhea or abdominal pain.  Denies symptoms of UTI.   ED Course: pt was found to have hemoglobin decreased from 7.9 on 07/05/2018 5.8, WBC 2.9, platelets of 130, worsening renal function, temperature 99.2, heart rate 80s, oxygen saturation 100% on room air, chest x-ray showed cardiomegaly with  vascular congestion.  Patient is placed on telemetry bed for observation.   Hospital Course:  Principal Problem:   Symptomatic anemia Active Problems:   Essential hypertension   Chronic combined systolic and diastolic heart failure (HCC)   CAD S/P percutaneous coronary angioplasty - prior PCI to LAD; RCA PCI: new Xience Alpine DES  2.75 mm x 15 mm    Dyslipidemia   COPD (chronic obstructive pulmonary disease) (HCC)   Chronic respiratory failure with hypoxia (HCC)   Iron deficiency anemia   On home O2   Acute renal failure superimposed on stage 3 chronic kidney disease (HCC)   Pancytopenia (HCC)   Type II diabetes mellitus with renal manifestations ([place])   Dysarthria   Morbid obesity with BMI of 60.0-69.9, adult (HCC)   BiPAP (biphasic positive airway pressure) dependence   Acute on chronic anemia, presented with symptomatic anemia ( feeling cold and lightheadedness)  -no overt bleeding, but reports intermittent dark stool -s/p two prbc , hgb improved post transfusion, presenting symptom resolved.  -retic inappropriately low, iron panel has improved compare to a month ago, folate and b12 unremarkable, tbilin unremarkable,  -anemia likely due to anemia of chronic disease and iron deficiency from intermittent gi loss,  - spep/light chains in process, pcp to follow up on final result. -s/p EGD/colonosocopy on 2/10 , no acute bleeding, she is cleared to d/c home from GI stand point, GI Dr Tamara Silva recommend repeat colonoscopy in 6 months due to poor prep this time, detail reports please refer to procedure reports.   AoCKD-III: Baseline Cre is~1.0, cr 1.6 on 2/5, cr 1.3 on presentation. Likely due to dehydrationand anemia  -Avoid renal toxic medication - cr improved, cr 1.17 at discharge , she is to follow up with pcp , repeat bmp at hospital discharge follow up.  CAD S/P percutaneous coronary angioplasty - prior PCI to LAD; RCA PCI: Xience Alpine DES 2.75 mm x 15 mm in 2015:No chest pain -aspirin held  since we needed to rule out GI bleeding, resumed at discharge, no acute bleed on egd/colonoscopy -Continue Coreg, Crestor  Chronic combined systolic and diastolic heart failure on home 02: -2D echo on 05/31/2018 showed EF of 40-45%. Patient has leg edema and mild shortness of breath, chest x-ray showed cardiomegaly  and vascular congestion -She was hospitalized from 1/25 to 1/29 due to acute on chronic heart failure. She was discharged home on nightly bipap and home o2 6liters Per chart review, she was on torsemide at one point, she is discharged on lasix from recent hospitalization -strict intake and output, good urine output on lasix in the hospital, she is euvolemic at discharge, lung clears, no leg edema -she is discharged on home dose lasix, she was taken off spironolactone from recent hospitalization in 05/2018, she is to follow up with cardiology on 2/14.  Insulin dependent dm2 a1c 7 in 06/01/2018 ( hgb 7.3 on 1/2) -d/c home on home insulin, f/u with pcp.   H/o CVA in 05/2018 She was hospitalized from 12/30 to 1/10 for a right MCA/PCA watershed area stroke and a 2 cm infarct in the inferior right parietal lobe (felt to be incidental) , she had hypercapnic respiratory failure, and tonic-clonic seizure required intubation during the hospitalization. She eventually was discharged to inpatient rehab (1/10-1/23), now at home with home health.    per progress note from 1/10 by Dr Earnest Conroy  Right MCA/PCA watershed/right parietal CVA:? Embolic vs cocaine induced. Urine drug screen positive for cocaine on admission. Per neurology note  from 1/2 : "Given patientnot a good long-term AC candidated/tchronic anemia, will not pursue further embolic inpatient workup (TEE, loop, hypercoagulable labs) can consider possible 30-day monitor as an OP to look for AF.". Cardiology and neurology recommended discontinuation of Effient since post stroke, patient refractory to Plavix.On aspirin and statins. Echo EF 45%  Questionable Tonic-clonic seizures x1  During hospitalization in 05/2018  Per DR Earnest Conroy progress note on 1/10:seizure activity "Secondary to problem #1 versus problem #2 vs cocaine induced. EEG normal. Received Keppra load but now off antiepileptics. No further seizure episodes."  H/o chronic LLL PE   (2014), not on chronic anticoagulation  COPD /chronic hypoxic respiratory failure on 4liter o2 24/7 at home for years Former smoker , reports quit in 2015  Morbid obesity:/OSA on cpap  H/o noncompliance with cpap (  reprots cpap machine has been broken for at least a year per chart review) Body mass index is 65.5 kg/m.  She was discharged home on nightly bipap from recent hospitalization a few weeks ago, continue home bipap  FTT: frequent hospitalization, will maximize home health with PT/OT/Speech/Aid/RN and social worker  H/o cocaine use (She doesn't want this discussed with her daughter.) uds negative this hospitalization  Patient reports not able to go to doctors follow up appointment, due to not able to afford  (1.5dollars for each service ) SCAT services, social worker consulted. Medicaid transportation assistant information provided to the patient.  Code Status: full  Family Communication: patient   Disposition Plan: home with GI clearance    Consultants:  [facility]   Procedures:  EGD/colonoscopy on 2/10 by [facility] Dr Tamara Silva  Antibiotics:  none   Discharge Exam: BP 133/64   Pulse 79   Temp 97.7 F (36.5 C) (Oral)   Resp (!) 26   Ht 5\' 7"  (1.702 m)   Wt (!) 189 kg   LMP 07/28/2013 Comment: perimenopausal  SpO2 96%   BMI 65.26 kg/m   General: NAD, sitting up on edge of bed, aaox3 Cardiovascular: RRR Respiratory: CTABL Extremity: no edema, chronic venous stasis changes  Discharge Instructions You were cared for by a hospitalist during your hospital stay. If you have any questions about your discharge medications or the care you received while you were in the hospital after you are discharged, you can call the unit and asked to speak with the hospitalist on call if the hospitalist that took care of you is not available. Once you are discharged, your primary care physician will handle any further medical issues. Please note that NO REFILLS for any  discharge medications will be authorized once you are discharged, as it is imperative that you return to your primary care physician (or establish a relationship with a primary care physician if you do not have one) for your aftercare needs so that they can reassess your need for medications and monitor your lab values.  Discharge Instructions    Diet - low sodium heart healthy   Complete by:  As directed    Carb modified diet   Increase activity slowly   Complete by:  As directed      Allergies as of 07/10/2018      Reactions   Metolazone Other (See Comments)   Dizziness and falling   Plavix [clopidogrel Bisulfate] Other (See Comments)   High PRU's - non-responder   Ibuprofen Other (See Comments)   Pt states she is not supposed to take ibuprofen because of other meds she is taking   Nsaids Other (  See Comments)   "I do not take NSAIDs d/t interfering with other meds"      Medication List    STOP taking these medications   EFFIENT PO   lactulose 10 GM/15ML solution Commonly known as:  CHRONULAC   SPIRONOLACTONE PO     TAKE these medications   albuterol 108 (90 Base) MCG/ACT inhaler Commonly known as:  PROVENTIL HFA;VENTOLIN HFA Inhale 2 puffs into the lungs every 6 (six) hours as needed for wheezing or shortness of breath.   aspirin 81 MG tablet Take 1 tablet (81 mg total) by mouth daily.   budesonide-formoterol 80-4.5 MCG/ACT inhaler Commonly known as:  SYMBICORT Inhale 2 puffs into the lungs 2 times daily at 12 noon and 4 pm.   carvedilol 3.125 MG tablet Commonly known as:  COREG Take 1 tablet (3.125 mg total) by mouth 2 (two) times daily with a meal.   ferrous gluconate 324 MG tablet Commonly known as:  FERGON Take 1 tablet (324 mg total) by mouth daily with breakfast. What changed:  when to take this   furosemide 40 MG tablet Commonly known as:  LASIX Take 1 tablet (40 mg total) by mouth daily.   hydrocerin Crea Apply 1 application topically 2 (two) times  daily.   Insulin Detemir 100 UNIT/ML Pen Commonly known as:  LEVEMIR FLEXTOUCH Inject 18 Units into the skin daily. What changed:  when to take this   ipratropium-albuterol 0.5-2.5 (3) MG/3ML Soln Commonly known as:  DUONEB Use nebulizer to inhale contents of one vial every 6 hours as needed for shortness of breath, wheezing   NEEDLE (DISP) 26 G 26G X 1/2" Misc 1 application by Does not apply route daily.   nitroGLYCERIN 0.4 MG SL tablet Commonly known as:  NITROSTAT Place 1 tablet (0.4 mg total) under the tongue every 5 (five) minutes as needed for chest pain.   omeprazole 40 MG capsule Commonly known as:  PRILOSEC Take 1 capsule (40 mg total) by mouth daily.   polyethylene glycol packet Commonly known as:  MIRALAX Take 17 g by mouth every Monday, Wednesday, and Friday.   Potassium Chloride ER 20 MEQ Tbcr Take 20 mEq by mouth every Monday, Wednesday, and Friday for 30 days. What changed:  when to take this   rosuvastatin 40 MG tablet Commonly known as:  CRESTOR Take 1 tablet (40 mg total) by mouth daily at 6 PM.   senna-docusate 8.6-50 MG tablet Commonly known as:  Senokot-S Take 1 tablet by mouth 2 (two) times daily.      Allergies  Allergen Reactions  . Metolazone Other (See Comments)    Dizziness and falling  . Plavix [Clopidogrel Bisulfate] Other (See Comments)    High PRU's - non-responder  . Ibuprofen Other (See Comments)    Pt states she is not supposed to take ibuprofen because of other meds she is taking  . Nsaids Other (See Comments)    "I do not take NSAIDs d/t interfering with other meds"   Follow-up Information    Fulp, Cammie, MD Follow up in 1 week(s).   Specialty:  Family Medicine Why:  hospital discharge follow up. pcp to refer to hematology for anemia management. Contact information: [place] [address] [phone]        Larey Dresser, MD Follow up in 1 month(s).   Specialty:  Cardiology Why:  for heart  failure management  Contact information: [address] [place] [address] [phone]  Garvin Fila, MD Follow up in 1 month(s).   Specialties:  Neurology, Radiology Why:  for stroke management Contact information: [address] [place] [address] [phone]        Clarene Essex, MD Follow up in 6 month(s).   Specialty:  Gastroenterology Why:  repeat colonoscopy  Contact information: [address] [place] [address] [phone]            The results of significant diagnostics from this hospitalization (including imaging, microbiology, ancillary and laboratory) are listed below for reference.    Significant Diagnostic Studies: Dg Chest 2 View  Result Date: 06/24/2018 CLINICAL DATA:  Shortness of breath. EXAM: CHEST - 2 VIEW COMPARISON:  06/12/2018. FINDINGS: Stable enlarged cardiac silhouette and prominent interstitial markings. Small pleural effusions. Stable scarring in the left lateral chest. Unremarkable bones. IMPRESSION: Stable cardiomegaly and mild changes of congestive heart failure. Electronically Signed   By: Claudie Revering M.D.   On: 06/24/2018 20:16   Dg Chest 2 View  Result Date: 06/12/2018 CLINICAL DATA:  Cough, wheezing and shortness of breath. EXAM: CHEST - 2 VIEW COMPARISON:  Single-view of the chest 06/09/2018 and 06/04/2018. PA and lateral chest 07/25/2017. CT chest 06/28/2015. FINDINGS: Cardiomegaly and pulmonary edema are chronic and appear unchanged. Pleural scar and calcification along the left upper chest wall are again identified. No pneumothorax or pleural effusion. No acute bony abnormality. IMPRESSION: Cardiomegaly and chronic mild pulmonary edema. Electronically Signed   By: Inge Rise M.D.   On: 06/12/2018 08:58   Dg Abd 1 View  Result Date: 06/12/2018 CLINICAL DATA:  Patient complains of constipation. EXAM: ABDOMEN - 1 VIEW COMPARISON:  June 05, 2018 FINDINGS: Mild fecal loading in the  colon. No bowel obstruction. No free air, portal venous gas, pneumatosis seen on supine imaging. No other acute abnormalities. IMPRESSION: Mild fecal loading in the colon. Electronically Signed   By: Dorise Bullion III M.D   On: 06/12/2018 19:39   Dg Chest Portable 1 View  Result Date: 07/08/2018 CLINICAL DATA:  Mild dyspnea, began feeling cold 1 hour ago, a typical feeling for exacerbation of her anemia, chronic cough shortness of breath, history coronary artery disease, CHF, diabetes mellitus, hypertension, former smoker EXAM: PORTABLE CHEST 1 VIEW COMPARISON:  Portable exam 2019 hours compared to 06/26/2018 FINDINGS: Enlargement of cardiac silhouette, chronic. Pulmonary vascular congestion. Mediastinal contours normal. Pleural calcification and scarring in the LEFT hemithorax unchanged. Mild diffuse BILATERAL interstitial infiltrates consistent with pulmonary edema. No pleural effusion or pneumothorax. Bones unremarkable. IMPRESSION: Enlargement of cardiac silhouette with pulmonary vascular congestion and mild pulmonary edema. Chronic scarring and pleural calcification of the LEFT hemithorax unchanged. Electronically Signed   By: Lavonia Dana M.D.   On: 07/08/2018 20:31   Dg Chest Port 1 View  Result Date: 06/26/2018 CLINICAL DATA:  Cough and shortness of Breath EXAM: PORTABLE CHEST 1 VIEW COMPARISON:  06/24/2018 FINDINGS: Cardiac shadow is enlarged but stable. Scarring is noted in the lateral aspect of the left lung. Central vascular congestion is again identified and stable. No new focal infiltrate is seen. No bony abnormality is noted. IMPRESSION: Stable CHF. Scarring in the left mid lung is noted and stable. Electronically Signed   By: Inez Catalina M.D.   On: 06/26/2018 09:51    Microbiology: Recent Results (from the past 240 hour(s))  Blood culture (routine x 2)     Status: None (Preliminary result)   Collection Time: 07/08/18  9:00 PM  Result Value  Ref Range Status   Specimen Description  BLOOD LEFT ARM  Final   Special Requests   Final    BOTTLES DRAWN AEROBIC AND ANAEROBIC Blood Culture adequate volume   Culture   Final    NO GROWTH < 24 HOURS Performed at [facility], [address], [place], [place] [address]    Report Status PENDING  Incomplete  Blood culture (routine x 2)     Status: None (Preliminary result)   Collection Time: 07/08/18  9:29 PM  Result Value Ref Range Status   Specimen Description BLOOD RIGHT HAND  Final   Special Requests   Final    BOTTLES DRAWN AEROBIC AND ANAEROBIC Blood Culture adequate volume   Culture   Final    NO GROWTH < 24 HOURS Performed at [facility], [place] [address], [place], [place] [address]    Report Status PENDING  Incomplete     Labs: Basic Metabolic Panel: Recent Labs  Lab 07/05/18 1219 07/08/18 2100 07/09/18 1149 07/10/18 0448  NA 142 135 137 138  K 3.8 3.7 3.6 3.6  CL 100 94* 97* 99  CO2 29 29 33* 33*  GLUCOSE 91 195* 205* 107*  BUN 26* 12 10 11   CREATININE 1.60* 1.31* 1.32* 1.17*  CALCIUM 8.4* 8.3* 8.6* 8.4*   Liver Function Tests: Recent Labs  Lab 07/09/18 1149 07/10/18 0448  AST 26 25  ALT 19 20  ALKPHOS 120 111  BILITOT 1.8* 1.4*  PROT 8.2* 8.5*  ALBUMIN 2.5* 2.6*   No results for input(s): LIPASE, AMYLASE in the last 168 hours. No results for input(s): AMMONIA in the last 168 hours. CBC: Recent Labs  Lab 07/05/18 1219 07/08/18 2100 07/09/18 1149 07/10/18 0448  WBC 9.6 2.9* 7.9 7.9  NEUTROABS 7.0 1.8  --   --   HGB 7.9* 5.8* 9.2* 8.8*  HCT 27.1* 20.7* 32.1* 30.7*  MCV 88 92.8 90.7 91.9  PLT 394 130* PLATELET CLUMPS NOTED ON SMEAR, COUNT APPEARS ADEQUATE 347   Cardiac Enzymes: No results for input(s): CKTOTAL, CKMB, CKMBINDEX, TROPONINI in the last 168 hours. BNP: BNP (last 3 results) Recent Labs    05/29/18 1052 06/24/18 2039 07/08/18 2100  BNP 1,218.8* 652.9* 669.1*    ProBNP (last 3 results) No results for input(s): PROBNP in the last 8760  hours.  CBG: Recent Labs  Lab 07/09/18 1112 07/09/18 1628 07/09/18 2039 07/10/18 0723 07/10/18 1145  GLUCAP 199* 181* 128* 90 94       Signed:  Florencia Reasons MD, PhD  [facility] 07/10/2018, 12:40 PM

## 2018-07-10 NOTE — Clinical Social Work Note (Signed)
CSW talked with patient at the bedside regarding transportation to medical appointments. CSW advised patient that Medicaid transportation is free and patient is aware of this and has used this transportation service before. Ms. Tamburri indicated that when she has used Medicaid transportation, she is usually the last person taken home and also many times she has to use the bathroom and they will not stop anywhere to allow her to do this. CSW expressed understanding. Ms. Bendix also requested to speak with MD about losing blood and MD advised. CSW signing off  as no other SW intervention services needed.  Danita Proud Givens, MSW, LCSW Licensed Clinical Social Worker [place] [phone]

## 2018-07-10 NOTE — Care Management Note (Signed)
Case Management Note  Patient Details  Name: Naoma Boxell MRN: [id number] Date of Birth: 11/22/1960  Subjective/Objective:      Symptomatic anemia            Action/Plan: PTA home with home health services [facility]). Pt asked about transportation services-refuses Medicaid transportation-advised that this was all CM or CSW had to offer. Daughter to provide transportation home, however, pt stating she will discharge tomorrow morning and daughter is unable to transport this afternoon or this evening d/t other obligations. Daughter s/w Dr. Erlinda Hong who advised that insurance may not cover another night and pt may get a bill. Daughter verbalized understanding. Choice offered for [facility] services-would like to continue with [facility]-referral accepted. Pt and daughter asked about reason for decreased blood count-CM followed up with Dr. Erlinda Hong, then reinforced MD explanation of pt patho. RN notified of pt wishes to transition home in the morning. No other transition of care needs identified at this time.   Expected Discharge Date:  07/10/18               Expected Discharge Plan:  [place]  In-House Referral:  NA  Discharge planning Services  CM Consult  Post Acute Care Choice:  Home Health Choice offered to:  Patient, Adult Children  DME Arranged:  N/A DME Agency:  NA  HH Arranged:  RN, OT, Nurse's Aide, PT, Social Work [employer] Agency:  [facility] [employer]  Status of Service:  Completed, signed off  If discussed at [employer], dates discussed:    Additional Comments:  Bartholomew Crews, RN 07/10/2018, 2:45 PM

## 2018-07-10 NOTE — Interval H&P Note (Signed)
History and Physical Interval Note: 57/female with anemia for an EGD and colonoscopy. She had an adenoma removed in 2015.  07/10/2018 10:09 AM  Tamara Silva  has presented today for EGD and colonoscopy with the diagnosis of anemia  The various methods of treatment have been discussed with the patient and family. After consideration of risks, benefits and other options for treatment, the patient has consented to  Procedure(s): ESOPHAGOGASTRODUODENOSCOPY (EGD) WITH PROPOFOL (N/A) COLONOSCOPY WITH PROPOFOL (N/A) as a surgical intervention .  The patient's history has been reviewed, patient examined, no change in status, stable for surgery.  I have reviewed the patient's chart and labs.  Questions were answered to the patient's satisfaction.     [name]

## 2018-07-10 NOTE — Op Note (Signed)
[facility] Patient Name: Tamara Silva Procedure Date : 07/10/2018 MRN: [id number] Attending MD: [name] , MD Date of Birth: May 12, 1961 CSN: [id number] Age: 58 Admit Type: Inpatient Procedure:                Upper GI endoscopy Indications:              Unexplained iron deficiency anemia Providers:                [name], MD, Burtis Junes, RN, Charolette Child,                            Technician Referring MD:              Medicines:                Monitored Anesthesia Care Complications:            No immediate complications. Estimated blood loss:                            None. Estimated Blood Loss:     Estimated blood loss was minimal. Procedure:                Pre-Anesthesia Assessment:                           - Prior to the procedure, a History and Physical                            was performed, and patient medications and                            allergies were reviewed. The patient's tolerance of                            previous anesthesia was also reviewed. The risks                            and benefits of the procedure and the sedation                            options and risks were discussed with the patient.                            All questions were answered, and informed consent                            was obtained. Prior Anticoagulants: The patient has                            taken no previous anticoagulant or antiplatelet                            agents. ASA Grade Assessment: III - A patient with  severe systemic disease. After reviewing the risks                            and benefits, the patient was deemed in                            satisfactory condition to undergo the procedure.                           After obtaining informed consent, the endoscope was                            passed under direct vision. Throughout the                            procedure, the patient's blood pressure,  pulse, and                            oxygen saturations were monitored continuously. The                            [id number]-H190 ([id number]) Olympus gastroscope was                            introduced through the mouth, and advanced to the                            second part of duodenum. The upper GI endoscopy was                            accomplished without difficulty. The patient                            tolerated the procedure well. Scope In: Scope Out: Findings:      The examined esophagus was normal.      The Z-line was regular and was found 42 cm from the incisors.      A single 8 mm submucosal papule (nodule) with no bleeding and no       stigmata of recent bleeding was found in the gastric body. Biopsies were       taken with a cold forceps for histology.      The cardia and gastric fundus were normal on retroflexion.      Localized moderate mucosal changes characterized by altered       texture,thickened mucosa were found in the first portion of the       duodenum. Biopsies were taken with a cold forceps for histology.      The ampulla, duodenal bulb and second portion of the duodenum were       normal. Impression:               - Normal esophagus.                           - Z-line regular, 42 cm from the incisors.                           -  A single submucosal papule (nodule) found in the                            stomach. Biopsied.                           - Mucosal changes in the duodenum. Biopsied.                           - Normal ampulla, duodenal bulb and second portion                            of the duodenum. Moderate Sedation:      Patient did not receive moderate sedation for this procedure, but       instead received monitored anesthesia care. Recommendation:           - Await pathology results.                           - Resume regular diet.                           - Continue present medications. Procedure Code(s):        --- Professional ---                            [phone], Esophagogastroduodenoscopy, flexible,                            transoral; with biopsy, single or multiple Diagnosis Code(s):        --- Professional ---                           K31.89, Other diseases of stomach and duodenum                           D50.9, Iron deficiency anemia, unspecified CPT copyright 2018 American Medical Association. All rights reserved. The codes documented in this report are preliminary and upon coder review may  be revised to meet current compliance requirements. [name], MD 07/10/2018 11:22:28 AM This report has been signed electronically. Number of Addenda: 0

## 2018-07-10 NOTE — Brief Op Note (Signed)
07/08/2018 - 07/10/2018  11:08 AM  PATIENT:  Tamara Silva  58 y.o. female  PRE-OPERATIVE DIAGNOSIS:  anemia  POST-OPERATIVE DIAGNOSIS:  EGD abnormal duodenal mucosa, gastric nodule, colon: poor prep  PROCEDURE:  Procedure(s): ESOPHAGOGASTRODUODENOSCOPY (EGD) WITH PROPOFOL (N/A) COLONOSCOPY WITH PROPOFOL (N/A) BIOPSY  SURGEON:  Surgeon(s) and Role:    [name], MD - Primary  PHYSICIAN ASSISTANT:   ASSISTANTS: Burtis Junes, RN, Charolette Child, MD  ANESTHESIA:   MAC  EBL:  None   BLOOD ADMINISTERED:none  DRAINS: none   LOCAL MEDICATIONS USED:  NONE  SPECIMEN:  Biopsy / Limited Resection  DISPOSITION OF SPECIMEN:  PATHOLOGY  COUNTS:  YES  TOURNIQUET:  * No tourniquets in log *  DICTATION: .Dragon Dictation  PLAN OF CARE: Admit to inpatient   PATIENT DISPOSITION:  PACU - hemodynamically stable.   Delay start of Pharmacological VTE agent (>24hrs) due to surgical blood loss or risk of bleeding: no

## 2018-07-10 NOTE — Anesthesia Preprocedure Evaluation (Addendum)
Anesthesia Evaluation  Patient identified by MRN, date of birth, ID band Patient awake    Reviewed: Allergy & Precautions, NPO status , Patient's Chart, lab work & pertinent test results  Airway Mallampati: I  TM Distance: >3 FB Neck ROM: Full    Dental   Pulmonary sleep apnea , COPD, former smoker,    Pulmonary exam normal        Cardiovascular hypertension, +CHF  Normal cardiovascular exam     Neuro/Psych Bipolar Disorder    GI/Hepatic   Endo/Other  diabetes, Type 2  Renal/GU Renal InsufficiencyRenal disease     Musculoskeletal   Abdominal   Peds  Hematology   Anesthesia Other Findings   Reproductive/Obstetrics                             Anesthesia Physical Anesthesia Plan  ASA: III  Anesthesia Plan: MAC   Post-op Pain Management:    Induction: Intravenous  PONV Risk Score and Plan: 2 and Treatment may vary due to age or medical condition  Airway Management Planned: Simple Face Mask  Additional Equipment:   Intra-op Plan:   Post-operative Plan:   Informed Consent: I have reviewed the patients History and Physical, chart, labs and discussed the procedure including the risks, benefits and alternatives for the proposed anesthesia with the patient or authorized representative who has indicated his/her understanding and acceptance.       Plan Discussed with: CRNA and Surgeon  Anesthesia Plan Comments:         Anesthesia Quick Evaluation

## 2018-07-10 NOTE — Plan of Care (Signed)
  Problem: Education: ?Goal: Knowledge of General Education information will improve ?Description: Including pain rating scale, medication(s)/side effects and non-pharmacologic comfort measures ?Outcome: Progressing ?  ?Problem: Clinical Measurements: ?Goal: Will remain free from infection ?Outcome: Progressing ?  ?Problem: Activity: ?Goal: Risk for activity intolerance will decrease ?Outcome: Progressing ?  ?Problem: Coping: ?Goal: Level of anxiety will decrease ?Outcome: Progressing ?  ?Problem: Safety: ?Goal: Ability to remain free from injury will improve ?Outcome: Progressing ?  ?

## 2018-07-10 NOTE — Progress Notes (Signed)
PT Cancellation Note  Patient Details Name: Tamara Silva MRN: [id number] DOB: 1960/06/07   Cancelled Treatment:    Reason Eval/Treat Not Completed: Patient declined, no reason specified. PT explained reason for consult and pt states "Well you picked the wrong day. I can't do PT and have a procedure done in the same day, it's too much." Pt adamant about not participating at this time. Will check back as schedule allows to initiate PT evaluation.     Thelma Comp 07/10/2018, 8:51 AM   Rolinda Roan, PT, DPT Acute Rehabilitation Services Pager: [phone] Office: [phone]

## 2018-07-10 NOTE — Op Note (Signed)
[facility] Patient Name: Tamara Silva Procedure Date : 07/10/2018 MRN: [id number] Attending MD: [name] , MD Date of Birth: September 18, 1960 CSN: [id number] Age: 58 Admit Type: Inpatient Procedure:                Colonoscopy Indications:              Last colonoscopy: 2015, Unexplained iron deficiency                            anemia Providers:                [name], MD, Burtis Junes, RN, Charolette Child,                            Technician Referring MD:              Medicines:                Monitored Anesthesia Care Complications:            No immediate complications. Estimated blood loss:                            None. Estimated Blood Loss:     Estimated blood loss: none. Procedure:                Pre-Anesthesia Assessment:                           - Prior to the procedure, a History and Physical                            was performed, and patient medications and                            allergies were reviewed. The patient's tolerance of                            previous anesthesia was also reviewed. The risks                            and benefits of the procedure and the sedation                            options and risks were discussed with the patient.                            All questions were answered, and informed consent                            was obtained. Prior Anticoagulants: The patient has                            taken no previous anticoagulant or antiplatelet                            agents. ASA Grade Assessment: III - A patient with  severe systemic disease. After reviewing the risks                            and benefits, the patient was deemed in                            satisfactory condition to undergo the procedure.                           - Prior to the procedure, a History and Physical                            was performed, and patient medications and   allergies were reviewed. The patient's tolerance of                            previous anesthesia was also reviewed. The risks                            and benefits of the procedure and the sedation                            options and risks were discussed with the patient.                            All questions were answered, and informed consent                            was obtained. Prior Anticoagulants: The patient has                            taken no previous anticoagulant or antiplatelet                            agents. ASA Grade Assessment: III - A patient with                            severe systemic disease. After reviewing the risks                            and benefits, the patient was deemed in                            satisfactory condition to undergo the procedure.                           After obtaining informed consent, the colonoscope                            was passed under direct vision. Throughout the                            procedure, the patient's blood pressure, pulse, and  oxygen saturations were monitored continuously. The                            PCF-H190DL ([id number] Olympus pediatric colonscope                            was introduced through the anus and advanced to the                            the cecum, identified by appendiceal orifice and                            ileocecal valve. The colonoscopy was performed                            without difficulty. The patient tolerated the                            procedure well. The quality of the bowel                            preparation was poor. Scope In: 10:34:23 AM Scope Out: 11:02:35 AM Scope Withdrawal Time: 0 hours 9 minutes 23 seconds  Total Procedure Duration: 0 hours 28 minutes 12 seconds  Findings:      The perianal and digital rectal examinations were normal.      A large amount of stool was found in the entire colon, making        visualization difficult. Lavage of the area was performed, resulting in       incomplete clearance with fair visualization.      Multiple small and large-mouthed diverticula were found in the sigmoid       colon and transverse colon.      The exam was otherwise without abnormality on direct and retroflexion       views. Impression:               - Preparation of the colon was poor.                           - Stool in the entire examined colon.                           - Diverticulosis in the sigmoid colon and in the                            transverse colon.                           - The examination was otherwise normal on direct                            and retroflexion views.                           - No specimens collected. Recommendation:           - Resume previous diet.                           -  Continue present medications.                           - Repeat colonoscopy in 6 months because the bowel                            preparation was poor. Procedure Code(s):        --- Professional ---                           [phone], Colonoscopy, flexible; diagnostic, including                            collection of specimen(s) by brushing or washing,                            when performed (separate procedure) Diagnosis Code(s):        --- Professional ---                           D50.9, Iron deficiency anemia, unspecified                           K57.30, Diverticulosis of large intestine without                            perforation or abscess without bleeding CPT copyright 2018 American Medical Association. All rights reserved. The codes documented in this report are preliminary and upon coder review may  be revised to meet current compliance requirements. [name], MD 07/10/2018 11:24:26 AM This report has been signed electronically. Number of Addenda: 0

## 2018-07-11 ENCOUNTER — Encounter (HOSPITAL_COMMUNITY): Payer: Self-pay | Admitting: Gastroenterology

## 2018-07-11 ENCOUNTER — Other Ambulatory Visit: Payer: Self-pay | Admitting: Family Medicine

## 2018-07-11 ENCOUNTER — Telehealth: Payer: Self-pay | Admitting: Family Medicine

## 2018-07-11 DIAGNOSIS — N182 Chronic kidney disease, stage 2 (mild): Secondary | ICD-10-CM

## 2018-07-11 DIAGNOSIS — I5042 Chronic combined systolic (congestive) and diastolic (congestive) heart failure: Secondary | ICD-10-CM

## 2018-07-11 DIAGNOSIS — Z79899 Other long term (current) drug therapy: Secondary | ICD-10-CM

## 2018-07-11 LAB — GLUCOSE, CAPILLARY: Glucose-Capillary: 189 mg/dL — ABNORMAL HIGH (ref 70–99)

## 2018-07-11 LAB — PROTEIN ELECTROPHORESIS, SERUM
A/G Ratio: 0.6 — ABNORMAL LOW (ref 0.7–1.7)
Albumin ELP: 2.9 g/dL (ref 2.9–4.4)
Alpha-1-Globulin: 0.4 g/dL (ref 0.0–0.4)
Alpha-2-Globulin: 0.5 g/dL (ref 0.4–1.0)
Beta Globulin: 1.4 g/dL — ABNORMAL HIGH (ref 0.7–1.3)
Gamma Globulin: 2.9 g/dL — ABNORMAL HIGH (ref 0.4–1.8)
Globulin, Total: 5.1 g/dL — ABNORMAL HIGH (ref 2.2–3.9)
Total Protein ELP: 8 g/dL (ref 6.0–8.5)

## 2018-07-11 LAB — KAPPA/LAMBDA LIGHT CHAINS
Kappa free light chain: 168.4 mg/L — ABNORMAL HIGH (ref 3.3–19.4)
Kappa, lambda light chain ratio: 2.07 — ABNORMAL HIGH (ref 0.26–1.65)
Lambda free light chains: 81.5 mg/L — ABNORMAL HIGH (ref 5.7–26.3)

## 2018-07-11 MED ORDER — POTASSIUM CHLORIDE ER 20 MEQ PO TBCR: 20.0000 meq | EXTENDED_RELEASE_TABLET | ORAL | 0 refills | Status: DC

## 2018-07-11 MED ORDER — POLYETHYLENE GLYCOL 3350 17 G PO PACK: 17.0000 g | PACK | ORAL | 0 refills | Status: DC

## 2018-07-11 MED ORDER — SENNOSIDES-DOCUSATE SODIUM 8.6-50 MG PO TABS: 1.0000 | ORAL_TABLET | Freq: Two times a day (BID) | ORAL | 0 refills | Status: DC

## 2018-07-11 NOTE — Telephone Encounter (Signed)
Patient request for advise.

## 2018-07-11 NOTE — Progress Notes (Signed)
Patient ID: Tamara Silva, female   DOB: 08-21-1960, 58 y.o.   MRN: [id number]   Patient called and left message stating that she was recently diagnosed with kidney disease and wonders if she is eligible for a shot to help with production of red blood cells.  Per patient's labs, her chronic kidney disease at this time appears to be mild and patient has been hospitalized for GI blood loss.  Patient likely does not qualify for a shot to help with red blood cell production as she does not have severe renal disease however a referral will be placed for patient to see a nephrologist however patient has a long history of noncompliance with medications and medical follow-up.

## 2018-07-11 NOTE — Progress Notes (Signed)
Pt placed on CPAP 8.2ZPP62 with 4 lpm of oxygen in line.  Full face mask patient states this is what she wears at home.

## 2018-07-11 NOTE — Telephone Encounter (Signed)
Please let patient know that it does not appear from her most recent blood work that her kidney disease is severe enough to warrant erythropoietin shots to help with her red blood cells.  However I did place a nephrology referral so that patient can be followed up by a kidney specialist after her hospital discharge.

## 2018-07-11 NOTE — Telephone Encounter (Signed)
New Message   Pt calling, states she was diagnosed with kidney disease and wants to know if she qualifies for a shot that helps her reproduce red blood cells. Please f/u

## 2018-07-11 NOTE — Progress Notes (Signed)
Discharge instructions, RX's and follow up appts explained and provided to patient and family verbalized understanding. Patient left floor via wheelchair accompanied by staff.  Mclain Freer Lynn, RN  

## 2018-07-11 NOTE — Plan of Care (Signed)

## 2018-07-12 ENCOUNTER — Telehealth: Payer: Self-pay

## 2018-07-12 NOTE — Telephone Encounter (Signed)
Patient called back to check on what she is suppose do.Please follow up.

## 2018-07-12 NOTE — Telephone Encounter (Signed)
MA spoke with patient and advised her on nephrology referral being placed yesterday and awaiting a call for an appointment. Patient advised to report to the ED if symptoms worsen in the mean time.

## 2018-07-12 NOTE — Telephone Encounter (Signed)
Transition Care Management Follow-up Telephone Call  Before the call was completed, the patient stated that she had a take another call and requested that this CM call back.   Call was returned to the patient at a later time # [phone]- [phone] and a message was left requesting the patient return call to this CM # [phone].    Date of discharge and from where: 07/11/2018, [facility]  How have you been since you were released from the hospital? She stated that she is " feeling okay for now."  Any questions or concerns? None reported.   Items Reviewed:  Did the pt receive and understand the discharge instructions provided? She said that she did not have them.   Medications obtained and verified? She said that her boyfriend went to pick up her potassium and new medications. She did not have the medication list/AVS. This CM reviewed the changes with her medication list - what is new, what has changed and what medications to stop taking ( effient, lactulose and spironolactone)  She said that she was writing down the medications that she is to stop and she then correctly stated the doses of potassium chloride and ferrous gluconate. She did not have any questions at this time.    She did state that she was taking insulin per sliding scale but the only insulin ordered is levemir 18 units daily. This is when she stated that she needed to take another call.     Any new allergies since your discharge?  None reported  Do you have support at home? She said that her boyfriend provides support.   Other (ie: DME, Home Health, etc) referral was made to [facility] for SN/PT/OT/ST/HHA/SW.  The patient stated that she was not sure when they were coming out to see her.   Call placed to [facility] # [phone], spoke to [place] who stated that she spoke to the patient already but they have not yet scheduled an appointment.  This CM requested that the nurse do a thorough medication  reconciliation in the home.   Community paramedicine has been ordered. Visit in the process of being scheduled.   She has a glucometer and a nebulizer and stated that she needs a new nebulizer.   She has O2 that she is using continuously at 4L/min.  She also has a BiPAP that she said she uses at night and during the day when needed.   Functional Questionnaire: (I = Independent and D = Dependent) ADL's: caregiver to provide assist when needed.   Has wheelchair to use as needed   Follow up appointments reviewed:    [facility] Hospital f/u appt confirmed? Needs to be scheduled.  [facility] f/u appt confirmed? Cardiology appointment 07/14/2018, neurology appointment 08/23/2018.   Are transportation arrangements needed? Not addressed prior to the patient ending the call.   If their condition worsens, is the pt aware to call  their PCP or go to the ED? Did not get addressed prior to patient  ending the call.   Was the patient provided with contact information for the PCP's office or ED? She has the clinic phone number.  Was the pt encouraged to call back with questions or concerns? Did not get addressed prior to patient  ending the call.

## 2018-07-13 ENCOUNTER — Telehealth: Payer: Self-pay | Admitting: Family Medicine

## 2018-07-13 LAB — CULTURE, BLOOD (ROUTINE X 2)
Culture: NO GROWTH
Culture: NO GROWTH
SPECIAL REQUESTS: ADEQUATE
Special Requests: ADEQUATE

## 2018-07-13 NOTE — Telephone Encounter (Signed)
Tamara Silva with [facility] called for verbal orders for  Nursing 1 a week for 9 weeks  Nurse bath aide for 2 a week for 8 weeks

## 2018-07-13 NOTE — Telephone Encounter (Signed)
Reviewed. Unfortunately patient has a history of non-compliance with medical follow-up. I think that she would be a good candidate for a [facility] program or office such as [facility] as they provide transportation to appointments and PACE also does a lot of home care for people with medical problems that make it difficult to attend follow-up appointments for their medical care

## 2018-07-13 NOTE — Telephone Encounter (Signed)
Contacted patient to complete discharge call.  She said that the nurse from [facility] was with her.  Instructed her to make sure that the nurse reviews her medications with her    Discussed the need for scheduling an appointment as she was just discharged from the hospital. She said that she does not want to talk about that.  She explained that she wants to see the kidney doctor before thinking about any other appointments, She said that she would call to schedule when she is ready,

## 2018-07-13 NOTE — Telephone Encounter (Signed)
Ok

## 2018-07-14 ENCOUNTER — Ambulatory Visit: Payer: Medicaid Other | Admitting: Cardiology

## 2018-07-14 ENCOUNTER — Encounter (HOSPITAL_COMMUNITY): Payer: Self-pay

## 2018-07-14 NOTE — Progress Notes (Signed)
Patient was seen in the ED for transfusion and is the reason she called regarding the injections which PCP did not deem necessary at the time. MA informed patient of referral being placed with kidney specialist and gave verbal orders for [facility].

## 2018-07-14 NOTE — Telephone Encounter (Signed)
MA spoke with Hilda Blades! Patient was provided nursing once a week for 9 weeks and bathing aid twice a week for 8 weeks. No further questions or concerns.

## 2018-07-16 NOTE — Progress Notes (Signed)
Based on patient's labs from her hospital encounter, it does not appear that her anemia was related to chronic kidney disease but I placed a referral for patient to see a kidney specialist. The hospitalist would have ordered a nephrology consult for the patient or given her medication to help stimulate blood cell production during her hospitalization if they thought that this was necessary. It would be the nephrologist and not the PCP who would order and administer the medication. Patient appeared to have undergone a work-up for a GI source of her blood loss during her recent hospitalization.

## 2018-07-17 ENCOUNTER — Encounter: Payer: Self-pay | Admitting: Cardiology

## 2018-07-17 ENCOUNTER — Telehealth: Payer: Self-pay | Admitting: *Deleted

## 2018-07-17 ENCOUNTER — Telehealth (HOSPITAL_COMMUNITY): Payer: Self-pay

## 2018-07-17 ENCOUNTER — Inpatient Hospital Stay: Payer: Self-pay | Admitting: Family Medicine

## 2018-07-17 NOTE — Telephone Encounter (Signed)
Spoke with [name] about visit for today and she stated she was having someone come over to fix her water heater and her electricity would be cut off until this afternoon. I asked if she would mind if I still came out and she declined for this morning and stated if she had enough minuets on her prepaid phone she would call me back to schedule an afternoon visit.

## 2018-07-17 NOTE — Telephone Encounter (Signed)
Patient verified DOB Patient is aware of needing to follow up with kidney specialist.

## 2018-07-17 NOTE — Telephone Encounter (Signed)
-----   Message from Antony Blackbird, MD sent at 07/16/2018 12:46 PM EST ----- It appears that patient was being worked up for a GI source of her blood work during her recent hospitalization but she should follow-up with the kidney specialist as well as she is on medications and also had medical conditions that can contribute to chronic renal disease

## 2018-07-20 ENCOUNTER — Telehealth: Payer: Self-pay | Admitting: *Deleted

## 2018-07-20 NOTE — Telephone Encounter (Signed)
MA provided verbal orders for [facility] 1 time a week for 1 week and then twice a week for 1 week.

## 2018-07-24 ENCOUNTER — Telehealth (HOSPITAL_COMMUNITY): Payer: Self-pay

## 2018-07-24 ENCOUNTER — Other Ambulatory Visit: Payer: Self-pay | Admitting: Family Medicine

## 2018-07-24 DIAGNOSIS — I5042 Chronic combined systolic (congestive) and diastolic (congestive) heart failure: Secondary | ICD-10-CM

## 2018-07-24 DIAGNOSIS — Z91199 Patient's other noncompliance with medication regimen for other reason: Secondary | ICD-10-CM | POA: Insufficient documentation

## 2018-07-24 DIAGNOSIS — Z91119 Patient's noncompliance with dietary regimen due to unspecified reason: Secondary | ICD-10-CM | POA: Insufficient documentation

## 2018-07-24 DIAGNOSIS — Z9119 Patient's noncompliance with other medical treatment and regimen: Secondary | ICD-10-CM

## 2018-07-24 DIAGNOSIS — Z91148 Patient's other noncompliance with medication regimen for other reason: Secondary | ICD-10-CM | POA: Insufficient documentation

## 2018-07-24 DIAGNOSIS — Z9114 Patient's other noncompliance with medication regimen: Secondary | ICD-10-CM

## 2018-07-24 DIAGNOSIS — Z9111 Patient's noncompliance with dietary regimen: Secondary | ICD-10-CM

## 2018-07-24 MED ORDER — POTASSIUM CHLORIDE ER 20 MEQ PO TBCR: 20.0000 meq | EXTENDED_RELEASE_TABLET | ORAL | 0 refills | Status: DC

## 2018-07-24 NOTE — Telephone Encounter (Signed)
Message left for Tamara Silva about medication refill from Dr. Chapman Fitch and instructions for same. Patient also advised to scheudle appointments with [facility] and Cardiology.

## 2018-07-24 NOTE — Telephone Encounter (Signed)
Spoke with Caili about me coming to see her today and she stated, "Not today." I asked her when an appropriate time would be so I could check on her and she stated, "Next week." Appointment scheduled for next Monday at 10:00. Obie also asked if she could get another refill on her Potassium due to only having 3 pills left and having no refills available. I advised her I would speak with the physician about same.

## 2018-07-24 NOTE — Progress Notes (Signed)
Patient ID: Tamara Silva, female   DOB: Oct 20, 1960, 58 y.o.   MRN: [id number]   Message received from community EMT that patient was about to run out of her potassium and only had 3 pills left and requested refill.  30-day supply of the potassium will be sent to patient's local pharmacy and per recent hospital discharge she is to take 20 mEq on Monday Wednesday and Friday.  Patient has not made an appointment here for hospital discharge follow-up and patient has not followed up with cardiology as she was supposed to do after her last hospitalization.  I will place a new referral to cardiology for the patient and message was sent back to community EMT that patient's potassium was filled for 30-day supply but that patient does need to have visit here as well as with cardiology status post hospitalization.

## 2018-07-27 ENCOUNTER — Other Ambulatory Visit (HOSPITAL_COMMUNITY): Payer: Self-pay | Admitting: Physical Medicine and Rehabilitation

## 2018-07-28 ENCOUNTER — Telehealth: Payer: Self-pay | Admitting: Family Medicine

## 2018-07-28 NOTE — Telephone Encounter (Signed)
New Message   Clare Gandy a PT assistant with [facility] is calling to inform Dr. Chapman Fitch that the patient refused services and he will be writing her up as a missed visit

## 2018-07-31 ENCOUNTER — Telehealth: Payer: Self-pay

## 2018-07-31 ENCOUNTER — Telehealth (HOSPITAL_COMMUNITY): Payer: Self-pay

## 2018-07-31 NOTE — Telephone Encounter (Signed)
This patient is non-compliant with follow-up and now will not let the EMT visit her home. Patient has had recent recurrent admissions for CHF. Can patient be dismissed from the practice? She may be better served by a PACE program or a practice that does house calls/home visits

## 2018-07-31 NOTE — Telephone Encounter (Signed)
Spoke with Tamara Silva who stated she did not need my services after letting her know multiple times what my role was. Patient stated, "I don't need that". Patient was advised she would be listed as "Patient Refused". Patient understood. Call completed, patient removed from list.

## 2018-07-31 NOTE — Telephone Encounter (Signed)
What I need to contact in order to have patient dismissed from the practice.  Patient has history of noncompliance with follow-up, noncompliance with medications

## 2018-07-31 NOTE — Telephone Encounter (Signed)
Message received from Jeris Penta, EMT noting that despite multiple attempts to meet with the patient , she has refused to be seen

## 2018-08-01 NOTE — Telephone Encounter (Signed)
Was she is in agreement with commencing the paramedicine program?  Do we have a family member or contact person?  We can also discuss the services of the PACE program with her to see if she will be interested.  I will be happy to discuss this case in person to see what options we have with regards to her chronic medical conditions. Thank you.

## 2018-08-02 ENCOUNTER — Other Ambulatory Visit: Payer: Self-pay | Admitting: Pharmacist

## 2018-08-02 MED ORDER — ROSUVASTATIN CALCIUM 40 MG PO TABS: 40.0000 mg | ORAL_TABLET | Freq: Every day | ORAL | 2 refills | Status: DC

## 2018-08-02 MED ORDER — OMEPRAZOLE 40 MG PO CPDR: 40.0000 mg | DELAYED_RELEASE_CAPSULE | Freq: Every day | ORAL | 2 refills | Status: DC

## 2018-08-03 ENCOUNTER — Encounter: Payer: Self-pay | Admitting: *Deleted

## 2018-08-03 NOTE — Telephone Encounter (Signed)
Called patient  to discuss medical follow up and paramedicine. Explained to her about the importance of medical follow up after hospitalization. She did not want to schedule a PCP appointment yet stating she was told to follow up in 3 months.  I told her that was after her office visit but since she was in the hospital after that, it was recommended that she see her PCP within a week.  She still did not want to schedule an appointment.   Regarding paramedicine. She said that she has [facility]  - SN and PT and did not see the need for someone else.  Explained the differences in the program and she was agreeable to having Nira Conn, EMT contact her again.    She then said that she had another call and had to go.

## 2018-08-04 NOTE — Telephone Encounter (Signed)
Thank you Tamara Silva for reaching out to her. Sometimes our patients get overwhelmed and lack of comprehension might lead to poor medical choices and decisions. We should continue to see her as long as she decides to keep [place] as her PCP. Thanks everyone.

## 2018-08-07 ENCOUNTER — Telehealth: Payer: Self-pay

## 2018-08-07 ENCOUNTER — Other Ambulatory Visit: Payer: Self-pay | Admitting: Family Medicine

## 2018-08-07 ENCOUNTER — Telehealth: Payer: Self-pay | Admitting: Family Medicine

## 2018-08-07 ENCOUNTER — Other Ambulatory Visit (HOSPITAL_COMMUNITY): Payer: Self-pay | Admitting: Cardiology

## 2018-08-07 DIAGNOSIS — E1165 Type 2 diabetes mellitus with hyperglycemia: Secondary | ICD-10-CM

## 2018-08-07 DIAGNOSIS — N182 Chronic kidney disease, stage 2 (mild): Principal | ICD-10-CM

## 2018-08-07 DIAGNOSIS — IMO0002 Reserved for concepts with insufficient information to code with codable children: Secondary | ICD-10-CM

## 2018-08-07 DIAGNOSIS — Z794 Long term (current) use of insulin: Principal | ICD-10-CM

## 2018-08-07 DIAGNOSIS — E1122 Type 2 diabetes mellitus with diabetic chronic kidney disease: Secondary | ICD-10-CM

## 2018-08-07 NOTE — Telephone Encounter (Signed)
Message reviewed.

## 2018-08-07 NOTE — Telephone Encounter (Signed)
New Message   [facility] home health nurse is calling to inform Dr. Chapman Fitch the pt has had 2 missed visits on 08/04/2018 and today 08/07/2018

## 2018-08-07 NOTE — Telephone Encounter (Signed)
Call placed to [facility] in attempt to speak to patient's home health nurse and inform her of the need for patient to schedule follow up with PCP. As per Jeris Penta, EMT the community medicine program is not taking any additional referrals and will not be able to see the patient at this time eventhough the patient had been referred in the past and refused.   Message left for Nurse Manager, Dana Allan, requesting a call back to this CM # [phone].

## 2018-08-07 NOTE — Telephone Encounter (Signed)
Refill request received for lasix 40 mg to take 1.5 pills (60 mg) twice per day-last refilled by hospitalist. Per most recent hospital discharge summary, she was discharged on lasix 40 mg once per day. Rx sent in for lasix 40 mg once per day #30 with no refill and patient has upcoming appointment with cardiology on 08/16/2018 per chart. Patient has never scheduled follow-up office visit after her most recent hospital discharge.

## 2018-08-08 ENCOUNTER — Other Ambulatory Visit: Payer: Self-pay | Admitting: Family Medicine

## 2018-08-08 NOTE — Telephone Encounter (Signed)
New Message   1) Medication(s) Requested (by name): ferrous gluconate (FERGON) 324 MG tablet  2) Pharmacy of Choice: [address]  3) Special Requests: Pt states she is out   Approved medications will be sent to the pharmacy, we will reach out if there is an issue.  Requests made after 3pm may not be addressed until the following business day!  If a patient is unsure of the name of the medication(s) please note and ask patient to call back when they are able to provide all info, do not send to responsible party until all information is available!

## 2018-08-09 ENCOUNTER — Telehealth: Payer: Self-pay | Admitting: Internal Medicine

## 2018-08-09 ENCOUNTER — Other Ambulatory Visit (HOSPITAL_COMMUNITY): Payer: Self-pay | Admitting: Cardiology

## 2018-08-09 ENCOUNTER — Encounter: Payer: Self-pay | Admitting: Internal Medicine

## 2018-08-09 ENCOUNTER — Other Ambulatory Visit: Payer: Self-pay | Admitting: Family Medicine

## 2018-08-09 DIAGNOSIS — R778 Other specified abnormalities of plasma proteins: Secondary | ICD-10-CM

## 2018-08-09 DIAGNOSIS — D649 Anemia, unspecified: Secondary | ICD-10-CM

## 2018-08-09 MED ORDER — FERROUS GLUCONATE 324 (38 FE) MG PO TABS: 324.0000 mg | ORAL_TABLET | Freq: Every day | ORAL | 0 refills | Status: DC

## 2018-08-09 NOTE — Telephone Encounter (Signed)
A new hem appt has been scheduled for the pt to see Dr. Walden Field on 3/20 at 1050am. Pt aware to arrive 15 minutes early. Letter mailed.

## 2018-08-09 NOTE — Progress Notes (Signed)
Patient ID: Tamara Silva, female   DOB: 07-08-1960, 58 y.o.   MRN: [id number]   Patient is status post hospital admission from 07/08/2018 through 07/11/2018 secondary to severe anemia with hemoglobin of 5.8 on day of admission on 07/08/2018.  Patient with hemoglobin of 8.8 on 07/10/2018 status post transfusion.  Patient also had protein electrophoresis and kappa/lambda light chains testing done during hospitalization but results are still pending at time of discharge.  Patient was instructed to follow-up with PCP in 1 week status post hospital discharge but patient has failed to keep follow-up appointment and states that she does not wish to follow-up for an additional 3 months status post hospital discharge.  Patient with abnormal protein electrophoresis with gamma globulin increased at 2.9 and patient with kappa free light chains increased at 168.4, lambda free light chains increased at 81.5 and ratio increased at 2.07.  Patient will be referred to hematology as per hospital discharge and follow-up of her anemia as well as abnormal results from protein electro pheresis and kappa/lambda light chain analysis.

## 2018-08-10 ENCOUNTER — Other Ambulatory Visit: Payer: Self-pay | Admitting: Family Medicine

## 2018-08-16 ENCOUNTER — Ambulatory Visit (INDEPENDENT_AMBULATORY_CARE_PROVIDER_SITE_OTHER): Payer: Medicaid Other | Admitting: Cardiovascular Disease

## 2018-08-16 ENCOUNTER — Other Ambulatory Visit: Payer: Self-pay

## 2018-08-16 ENCOUNTER — Encounter: Payer: Self-pay | Admitting: Cardiovascular Disease

## 2018-08-16 VITALS — BP 144/76 | HR 68 | Ht 67.5 in | Wt 301.2 lb

## 2018-08-16 DIAGNOSIS — I2510 Atherosclerotic heart disease of native coronary artery without angina pectoris: Secondary | ICD-10-CM

## 2018-08-16 DIAGNOSIS — E785 Hyperlipidemia, unspecified: Secondary | ICD-10-CM

## 2018-08-16 DIAGNOSIS — I5042 Chronic combined systolic (congestive) and diastolic (congestive) heart failure: Secondary | ICD-10-CM | POA: Diagnosis not present

## 2018-08-16 DIAGNOSIS — E1169 Type 2 diabetes mellitus with other specified complication: Secondary | ICD-10-CM | POA: Diagnosis not present

## 2018-08-16 DIAGNOSIS — Z720 Tobacco use: Secondary | ICD-10-CM | POA: Diagnosis not present

## 2018-08-16 DIAGNOSIS — Z9861 Coronary angioplasty status: Secondary | ICD-10-CM | POA: Diagnosis not present

## 2018-08-16 DIAGNOSIS — I10 Essential (primary) hypertension: Secondary | ICD-10-CM | POA: Diagnosis not present

## 2018-08-16 NOTE — Assessment & Plan Note (Signed)
History of obesity hypoventilation syndrome on nocturnal BiPAP

## 2018-08-16 NOTE — Assessment & Plan Note (Signed)
History of CAD status post myocardial infarction in 2006 with stenting at [facility], and MI 3 years later 2009 with stenting at [facility].  Her most recent intervention was performed by Dr. Ellyn Hack 10/07/2013 with a Xience 2.75 mm x 11 mm long drug-eluting stent placed in the distal RCA.  There was mild in-stent restenosis within the LAD stent at that time as well as an EF of 30% with elevated LVEDP.  This was in the setting of an inferior STEMI.  She was on low-dose aspirin and Prasugrel.  Her Prasugrel was discontinued during her early January hospitalization because of profound anemia.

## 2018-08-16 NOTE — Assessment & Plan Note (Signed)
History of hyperlipidemia on statin therapy with lipid profile performed 06/01/2018 revealing to cholesterol 83, LDL 43 and HDL of 25

## 2018-08-16 NOTE — Progress Notes (Signed)
08/16/2018 Tamara Silva   Dec 10, 1960  [id number]  Primary Physician Antony Blackbird, MD Primary Cardiologist: Lorretta Harp MD [name], [place]  HPI:  Tamara Silva is a 58 y.o. morbidly overweight divorced African-American female mother of 46, grandmother of 2 grandchildren who I am seeing back after her recent multiple hospitalizations.  I last saw her in the office 02/08/2013.  She has been followed by Dr. Aundra Dubin in the heart failure clinic most recently.  She is a long history tobacco abuse having recently discontinued in January, treated hypertension, diabetes and hyperlipidemia.  Her first heart attack was in 2006 status post stenting at [facility] in [place].  She had a myocardial infarction 3 years later in 2009 and had stenting at [facility].  Her most recent intervention was performed by Dr. Ellyn Hack in 2015 with a Xience drug-eluting stent to her distal RCA.  Her LAD stent had mild in-stent restenosis in her EF at that time was in the 30% range.  She was placed on low-dose aspirin and Prasugrel.  She was admitted 3 times since late December with profound anemia, and combined systolic and diastolic heart failure requiring transfusion and diuresis.  Her pressor grill was discontinued at that time.  Her most recent hospitalization was 07/08/2018.  She is on chronic home O2 as well as nocturnal BiPAP.   Current Meds  Medication Sig  . ACCU-CHEK AVIVA PLUS test strip USE 1 STRIP TO CHECK GLUCOSE 3 TIMES DAILY  . aspirin 81 MG tablet Take 1 tablet (81 mg total) by mouth daily.  . budesonide-formoterol (SYMBICORT) 80-4.5 MCG/ACT inhaler Inhale 2 puffs into the lungs 2 times daily at 12 noon and 4 pm.  . carvedilol (COREG) 3.125 MG tablet Take 1 tablet (3.125 mg total) by mouth 2 (two) times daily with a meal.  . ferrous gluconate (FERGON) 324 MG tablet Take 1 tablet (324 mg total) by mouth daily with breakfast. Office visit needed prior to future refills  . furosemide (LASIX) 40  MG tablet Take 1 tablet (40 mg total) by mouth daily for 30 days.  . hydrocerin (EUCERIN) CREA Apply 1 application topically 2 (two) times daily.  . Insulin Detemir (LEVEMIR FLEXTOUCH) 100 UNIT/ML Pen Inject 18 Units into the skin daily. (Patient taking differently: Inject 18 Units into the skin at bedtime. )  . ipratropium-albuterol (DUONEB) 0.5-2.5 (3) MG/3ML SOLN Use nebulizer to inhale contents of one vial every 6 hours as needed for shortness of breath, wheezing (Patient taking differently: Inhale 3 mLs into the lungs every 6 (six) hours as needed (shortness of breath or wheezing). )  . NEEDLE, DISP, 26 G 26G X 1/2" MISC 1 application by Does not apply route daily.  . nitroGLYCERIN (NITROSTAT) 0.4 MG SL tablet Place 1 tablet (0.4 mg total) under the tongue every 5 (five) minutes as needed for chest pain.  [name] Kitchen omeprazole (PRILOSEC) 40 MG capsule Take 1 capsule (40 mg total) by mouth daily.  . polyethylene glycol (MIRALAX) packet Take 17 g by mouth every Monday, Wednesday, and Friday.  . Potassium Chloride ER 20 MEQ TBCR Take 20 mEq by mouth every Monday, Wednesday, and Friday for 30 days.  [name] Kitchen PROAIR HFA 108 (90 Base) MCG/ACT inhaler INHALE 2 PUFFS BY MOUTH 4 TIMES DAILY AS NEEDED  . rosuvastatin (CRESTOR) 40 MG tablet Take 1 tablet (40 mg total) by mouth daily at 6 PM.  . senna-docusate (SENOKOT-S) 8.6-50 MG tablet Take 1 tablet by mouth 2 (two) times daily.  Allergies  Allergen Reactions  . Metolazone Other (See Comments)    Dizziness and falling  . Plavix [Clopidogrel Bisulfate] Other (See Comments)    High PRU's - non-responder  . Ibuprofen Other (See Comments)    Pt states she is not supposed to take ibuprofen because of other meds she is taking  . Nsaids Other (See Comments)    "I do not take NSAIDs d/t interfering with other meds"    Social History   Socioeconomic History  . Marital status: Single    Spouse name: Not on file  . Number of children: 1  . Years of education:  Not on file  . Highest education level: Not on file  Occupational History  . Not on file  Social Needs  . Financial resource strain: Somewhat hard  . Food insecurity:    Worry: Sometimes true    Inability: Sometimes true  . Transportation needs:    Medical: Yes    Non-medical: Yes  Tobacco Use  . Smoking status: Former Smoker    Packs/day: 0.50    Years: 20.00    Pack years: 10.00    Types: Cigarettes    Last attempt to quit: 04/30/2014    Years since quitting: 4.2  . Smokeless tobacco: Never Used  . Tobacco comment: smoked off and on x 20 years  Substance and Sexual Activity  . Alcohol use: No    Alcohol/week: 0.0 standard drinks  . Drug use: No  . Sexual activity: Not Currently  Lifestyle  . Physical activity:    Days per week: 0 days    Minutes per session: 0 min  . Stress: Rather much  Relationships  . Social connections:    Talks on phone: Twice a week    Gets together: Once a week    Attends religious service: 1 to 4 times per year    Active member of club or organization: No    Attends meetings of clubs or organizations: Never    Relationship status: Living with partner  . Intimate partner violence:    Fear of current or ex partner: Patient refused    Emotionally abused: Patient refused    Physically abused: Patient refused    Forced sexual activity: Patient refused  Other Topics Concern  . Not on file  Social History Narrative   Origibnally from [place]   Most recently from [place]   Daughter lives in town      On Social security to CHF, COPD, CAD     Review of Systems: General: negative for chills, fever, night sweats or weight changes.  Cardiovascular: negative for chest pain, dyspnea on exertion, edema, orthopnea, palpitations, paroxysmal nocturnal dyspnea or shortness of breath Dermatological: negative for rash Respiratory: negative for cough or wheezing Urologic: negative for hematuria Abdominal: negative for nausea, vomiting, diarrhea,  bright red blood per rectum, melena, or hematemesis Neurologic: negative for visual changes, syncope, or dizziness All other systems reviewed and are otherwise negative except as noted above.    Blood pressure (!) 144/76, pulse 68, height 5' 7.5" (1.715 m), weight (!) 301 lb 3.2 oz (136.6 kg), last menstrual period 07/28/2013.  General appearance: alert and no distress Neck: no adenopathy, no carotid bruit, no JVD, supple, symmetrical, trachea midline and thyroid not enlarged, symmetric, no tenderness/mass/nodules Lungs: clear to auscultation bilaterally Heart: regular rate and rhythm, S1, S2 normal, no murmur, click, rub or gallop Extremities: 2+ pitting edema bilaterally Pulses: 2+ and symmetric Skin: Skin color, texture, turgor normal.  No rashes or lesions Neurologic: Alert and oriented X 3, normal strength and tone. Normal symmetric reflexes. Normal coordination and gait  EKG normal sinus rhythm at 68 with poor R wave progression, inferior Q waves and nonspecific ST and T wave changes.  I personally reviewed this EKG.  ASSESSMENT AND PLAN:   Essential hypertension History of essential hypertension her blood pressure measured today at 144/76.  She is on low-dose carvedilol.  Continue current meds at current dosing  Chronic combined systolic and diastolic heart failure ([place]) Ms. Garbett is had chronic systolic heart failure followed by Dr. Aundra Dubin in the past.  Her ejection fraction is in the 40 to 45% range with grade 2 diastolic dysfunction by recent echo performed 05/31/2018.  She currently is on low-dose carvedilol and is not on Entresto or spironolactone.  Her most recent serum creatinine was 1.17.  She is on chronic O2 as well as nocturnal BiPAP.  I am going to refer her back to Dr. Aundra Dubin for pharmacologic optimization.  CAD S/P percutaneous coronary angioplasty - prior PCI to LAD; RCA PCI: new Xience Alpine DES 2.75 mm x 15 mm  History of CAD status post myocardial infarction in  2006 with stenting at [facility], and [place] 3 years later 2009 with stenting at [facility].  Her most recent intervention was performed by Dr. Ellyn Hack 10/07/2013 with a Xience 2.75 mm x 11 mm long drug-eluting stent placed in the distal RCA.  There was mild in-stent restenosis within the LAD stent at that time as well as an EF of 30% with elevated LVEDP.  This was in the setting of an inferior STEMI.  She was on low-dose aspirin and Prasugrel.  Her Prasugrel was discontinued during her early January hospitalization because of profound anemia.  Obesity hypoventilation syndrome (HCC) History of obesity hypoventilation syndrome on nocturnal BiPAP  Dyslipidemia associated with type 2 diabetes mellitus ([place]) History of hyperlipidemia on statin therapy with lipid profile performed 06/01/2018 revealing to cholesterol 83, LDL 43 and HDL of 25  Tobacco abuse disorder History of recently discontinued tobacco abuse during her January hospitalization after her stroke      Lorretta Harp MD [facility] 08/16/2018 12:12 PM

## 2018-08-16 NOTE — Assessment & Plan Note (Signed)
History of recently discontinued tobacco abuse during her January hospitalization after her stroke

## 2018-08-16 NOTE — Patient Instructions (Addendum)
Medication Instructions:  Your physician recommends that you continue on your current medications as directed. Please refer to the Current Medication list given to you today.  If you need a refill on your cardiac medications before your next appointment, please call your pharmacy.   Lab work: [employer] If you have labs (blood work) drawn today and your tests are completely normal, you will receive your results only by: [name] Kitchen MyChart Message (if you have MyChart) OR . A paper copy in the mail If you have any lab test that is abnormal or we need to change your treatment, we will call you to review the results.  Testing/Procedures: NONE  Follow-Up: At [facility], you and your health needs are our priority.  As part of our continuing mission to provide you with exceptional heart care, we have created designated Provider Care Teams.  These Care Teams include your primary Cardiologist (physician) and Advanced Practice Providers (APPs -  Physician Assistants and Nurse Practitioners) who all work together to provide you with the care you need, when you need it. . You will need a follow up appointment in 3 months with an APP and in 6 months with Dr. Gwenlyn Found.  Please call our office 2 months in advance to schedule this appointment.  You may see one of the following Advanced Practice Providers on your designated Care Team:   . Kerin Ransom, [place] . Almyra Deforest, PA-C . Fabian Sharp, PA-C . Jory Sims, DNP . Rosaria Ferries, PA-C . Roby Lofts, PA-C . Sande Rives, PA-C  Any Other Special Instructions Will Be Listed Below (If Applicable). REFERRAL TO ADVANCED HEART FAILURE CLINIC. PLEASE SCHEDULE APPOINTMENT WITH DR. [name] MCLEAN IN THE NEXT 2 WEEKS.

## 2018-08-16 NOTE — Assessment & Plan Note (Signed)
History of essential hypertension her blood pressure measured today at 144/76.  She is on low-dose carvedilol.  Continue current meds at current dosing

## 2018-08-16 NOTE — Assessment & Plan Note (Signed)
Tamara Silva is had chronic systolic heart failure followed by Dr. Aundra Dubin in the past.  Her ejection fraction is in the 40 to 45% range with grade 2 diastolic dysfunction by recent echo performed 05/31/2018.  She currently is on low-dose carvedilol and is not on Entresto or spironolactone.  Her most recent serum creatinine was 1.17.  She is on chronic O2 as well as nocturnal BiPAP.  I am going to refer her back to Dr. Aundra Dubin for pharmacologic optimization.

## 2018-08-17 ENCOUNTER — Telehealth: Payer: Self-pay | Admitting: Family Medicine

## 2018-08-17 ENCOUNTER — Other Ambulatory Visit: Payer: Self-pay | Admitting: Family Medicine

## 2018-08-17 DIAGNOSIS — I5042 Chronic combined systolic (congestive) and diastolic (congestive) heart failure: Secondary | ICD-10-CM

## 2018-08-17 NOTE — Telephone Encounter (Signed)
Put on hold? Please clarify this...[name] KitchenMarland Kitchen

## 2018-08-17 NOTE — Progress Notes (Signed)
Patient ID: Tamara Silva, female   DOB: 23-Jan-1961, 58 y.o.   MRN: [id number]   Request received for refill of ferrous sulfate and this was refilled back on 08/09/18.  Patient does have an upcoming hematology appointment. Patient also her cardiologist yesterday who wants her to follow-up with Dr. Loralie Champagne in heart failure clinic and referral will be placed for this as well.

## 2018-08-17 NOTE — Telephone Encounter (Signed)
April from [facility] called in stating pt wanted to be put on hold due to the corona virus please follow up

## 2018-08-18 ENCOUNTER — Other Ambulatory Visit: Payer: Self-pay

## 2018-08-18 ENCOUNTER — Encounter: Payer: Self-pay | Admitting: Internal Medicine

## 2018-08-18 ENCOUNTER — Inpatient Hospital Stay: Payer: Medicaid Other

## 2018-08-18 ENCOUNTER — Inpatient Hospital Stay: Payer: Medicaid Other | Attending: Internal Medicine | Admitting: Internal Medicine

## 2018-08-18 ENCOUNTER — Telehealth: Payer: Self-pay | Admitting: Internal Medicine

## 2018-08-18 VITALS — BP 118/60 | HR 67 | Temp 98.2°F | Resp 17 | Ht 67.5 in | Wt 302.9 lb

## 2018-08-18 DIAGNOSIS — R350 Frequency of micturition: Secondary | ICD-10-CM | POA: Insufficient documentation

## 2018-08-18 DIAGNOSIS — J449 Chronic obstructive pulmonary disease, unspecified: Secondary | ICD-10-CM | POA: Diagnosis not present

## 2018-08-18 DIAGNOSIS — Z87891 Personal history of nicotine dependence: Secondary | ICD-10-CM

## 2018-08-18 DIAGNOSIS — D472 Monoclonal gammopathy: Secondary | ICD-10-CM | POA: Diagnosis not present

## 2018-08-18 DIAGNOSIS — D508 Other iron deficiency anemias: Secondary | ICD-10-CM

## 2018-08-18 DIAGNOSIS — R102 Pelvic and perineal pain: Secondary | ICD-10-CM | POA: Insufficient documentation

## 2018-08-18 DIAGNOSIS — M25559 Pain in unspecified hip: Secondary | ICD-10-CM

## 2018-08-18 DIAGNOSIS — Z803 Family history of malignant neoplasm of breast: Secondary | ICD-10-CM

## 2018-08-18 DIAGNOSIS — I130 Hypertensive heart and chronic kidney disease with heart failure and stage 1 through stage 4 chronic kidney disease, or unspecified chronic kidney disease: Secondary | ICD-10-CM | POA: Insufficient documentation

## 2018-08-18 DIAGNOSIS — Z7982 Long term (current) use of aspirin: Secondary | ICD-10-CM | POA: Insufficient documentation

## 2018-08-18 DIAGNOSIS — N182 Chronic kidney disease, stage 2 (mild): Secondary | ICD-10-CM

## 2018-08-18 DIAGNOSIS — K5790 Diverticulosis of intestine, part unspecified, without perforation or abscess without bleeding: Secondary | ICD-10-CM

## 2018-08-18 DIAGNOSIS — R899 Unspecified abnormal finding in specimens from other organs, systems and tissues: Secondary | ICD-10-CM

## 2018-08-18 DIAGNOSIS — I2510 Atherosclerotic heart disease of native coronary artery without angina pectoris: Secondary | ICD-10-CM | POA: Insufficient documentation

## 2018-08-18 DIAGNOSIS — D509 Iron deficiency anemia, unspecified: Secondary | ICD-10-CM | POA: Diagnosis not present

## 2018-08-18 DIAGNOSIS — J9690 Respiratory failure, unspecified, unspecified whether with hypoxia or hypercapnia: Secondary | ICD-10-CM

## 2018-08-18 DIAGNOSIS — Z9981 Dependence on supplemental oxygen: Secondary | ICD-10-CM | POA: Diagnosis not present

## 2018-08-18 DIAGNOSIS — N183 Chronic kidney disease, stage 3 (moderate): Secondary | ICD-10-CM | POA: Diagnosis not present

## 2018-08-18 DIAGNOSIS — Z7901 Long term (current) use of anticoagulants: Secondary | ICD-10-CM | POA: Insufficient documentation

## 2018-08-18 DIAGNOSIS — E1169 Type 2 diabetes mellitus with other specified complication: Secondary | ICD-10-CM

## 2018-08-18 DIAGNOSIS — Z800 Family history of malignant neoplasm of digestive organs: Secondary | ICD-10-CM

## 2018-08-18 DIAGNOSIS — Z79899 Other long term (current) drug therapy: Secondary | ICD-10-CM | POA: Insufficient documentation

## 2018-08-18 DIAGNOSIS — E669 Obesity, unspecified: Secondary | ICD-10-CM

## 2018-08-18 DIAGNOSIS — R748 Abnormal levels of other serum enzymes: Secondary | ICD-10-CM | POA: Diagnosis not present

## 2018-08-18 DIAGNOSIS — E1122 Type 2 diabetes mellitus with diabetic chronic kidney disease: Secondary | ICD-10-CM | POA: Diagnosis not present

## 2018-08-18 DIAGNOSIS — L309 Dermatitis, unspecified: Secondary | ICD-10-CM | POA: Insufficient documentation

## 2018-08-18 DIAGNOSIS — I10 Essential (primary) hypertension: Secondary | ICD-10-CM

## 2018-08-18 LAB — CBC WITH DIFFERENTIAL (CANCER CENTER ONLY)
Abs Immature Granulocytes: 0.01 10*3/uL (ref 0.00–0.07)
Basophils Absolute: 0.1 10*3/uL (ref 0.0–0.1)
Basophils Relative: 1 %
EOS ABS: 0.2 10*3/uL (ref 0.0–0.5)
Eosinophils Relative: 3 %
HCT: 33.3 % — ABNORMAL LOW (ref 36.0–46.0)
Hemoglobin: 9.6 g/dL — ABNORMAL LOW (ref 12.0–15.0)
Immature Granulocytes: 0 %
Lymphocytes Relative: 32 %
Lymphs Abs: 1.9 10*3/uL (ref 0.7–4.0)
MCH: 26.1 pg (ref 26.0–34.0)
MCHC: 28.8 g/dL — ABNORMAL LOW (ref 30.0–36.0)
MCV: 90.5 fL (ref 80.0–100.0)
MONO ABS: 0.8 10*3/uL (ref 0.1–1.0)
Monocytes Relative: 13 %
Neutro Abs: 3.1 10*3/uL (ref 1.7–7.7)
Neutrophils Relative %: 51 %
Platelet Count: 220 10*3/uL (ref 150–400)
RBC: 3.68 MIL/uL — ABNORMAL LOW (ref 3.87–5.11)
RDW: 16.5 % — ABNORMAL HIGH (ref 11.5–15.5)
WBC Count: 6 10*3/uL (ref 4.0–10.5)
nRBC: 0 % (ref 0.0–0.2)

## 2018-08-18 LAB — CMP (CANCER CENTER ONLY)
ALT: 19 U/L (ref 0–44)
AST: 28 U/L (ref 15–41)
Albumin: 2.9 g/dL — ABNORMAL LOW (ref 3.5–5.0)
Alkaline Phosphatase: 210 U/L — ABNORMAL HIGH (ref 38–126)
Anion gap: 8 (ref 5–15)
BUN: 17 mg/dL (ref 6–20)
CO2: 31 mmol/L (ref 22–32)
Calcium: 8.6 mg/dL — ABNORMAL LOW (ref 8.9–10.3)
Chloride: 102 mmol/L (ref 98–111)
Creatinine: 1.32 mg/dL — ABNORMAL HIGH (ref 0.44–1.00)
GFR, Est AFR Am: 51 mL/min — ABNORMAL LOW (ref >60)
GFR, Estimated: 44 mL/min — ABNORMAL LOW (ref >60)
Glucose, Bld: 129 mg/dL — ABNORMAL HIGH (ref 70–99)
Potassium: 4.5 mmol/L (ref 3.5–5.1)
Sodium: 141 mmol/L (ref 135–145)
Total Bilirubin: 0.6 mg/dL (ref 0.3–1.2)
Total Protein: 9.4 g/dL — ABNORMAL HIGH (ref 6.5–8.1)

## 2018-08-18 LAB — IRON AND TIBC
Iron: 48 ug/dL (ref 41–142)
Saturation Ratios: 14 % — ABNORMAL LOW (ref 21–57)
TIBC: 333 ug/dL (ref 236–444)
UIBC: 285 ug/dL (ref 120–384)

## 2018-08-18 LAB — LACTATE DEHYDROGENASE: LDH: 247 U/L — ABNORMAL HIGH (ref 98–192)

## 2018-08-18 LAB — C-REACTIVE PROTEIN: CRP: 1.4 mg/dL — ABNORMAL HIGH (ref <1.0)

## 2018-08-18 LAB — SEDIMENTATION RATE: Sed Rate: 90 mm/hr — ABNORMAL HIGH (ref 0–22)

## 2018-08-18 LAB — VITAMIN B12: Vitamin B-12: 690 pg/mL (ref 180–914)

## 2018-08-18 LAB — FOLATE: Folate: 20.9 ng/mL (ref >5.9)

## 2018-08-18 LAB — FERRITIN: Ferritin: 85 ng/mL (ref 11–307)

## 2018-08-18 NOTE — Telephone Encounter (Signed)
Scheduled appt per 3/20 los.  Printed calendar and avs.

## 2018-08-18 NOTE — Progress Notes (Signed)
Referring Physician:  Antony Blackbird, MD  Diagnosis Abnormal laboratory test - Plan: CBC with Differential ([facility] Only), CMP ([place] only), Lactate dehydrogenase (LDH), Sedimentation rate, Ferritin, Iron and TIBC, Folate, Serum, Vitamin B12, Hemoglobinopathy evaluation, Haptoglobin, Methylmalonic acid, serum, Kappa/lambda light chains, SPEP with reflex to IFE, QIG  (Quant. immunoglobulins  - IgG, IgA, IgM), ANA, IFA (with reflex), C-reactive protein  Other iron deficiency anemia - Plan: CBC with Differential (Cancer Center Only), CMP ([place] only), Lactate dehydrogenase (LDH), Sedimentation rate, Ferritin, Iron and TIBC, Folate, Serum, Vitamin B12, Hemoglobinopathy evaluation, Haptoglobin, Methylmalonic acid, serum, Kappa/lambda light chains, SPEP with reflex to IFE, QIG  (Quant. immunoglobulins  - IgG, IgA, IgM), ANA, IFA (with reflex), C-reactive protein  Pain in joint involving pelvic region and thigh, unspecified laterality - Plan: CBC with Differential ([place] Only), CMP ([place] only), Lactate dehydrogenase (LDH), Sedimentation rate, Ferritin, Iron and TIBC, Folate, Serum, Vitamin B12, Hemoglobinopathy evaluation, Haptoglobin, Methylmalonic acid, serum, Kappa/lambda light chains, SPEP with reflex to IFE, QIG  (Quant. immunoglobulins  - IgG, IgA, IgM), ANA, IFA (with reflex), C-reactive protein  Staging Cancer Staging No matching staging information was found for the patient.  Assessment and Plan: 1.  Iron deficiency anemia.  58 year old female referred for evaluation due to anemia.  Patient is status post hospital admission from 07/08/2018 through 07/11/2018 secondary to severe anemia with hemoglobin of 5.8 on day of admission on 07/08/2018.  Patient with hemoglobin of 8.8 on 07/10/2018 status post transfusion.  Pt was previously treated with 2 doses of feraheme in 05/2018.  She reports frequent urination due to lasix.   She has been on oxygen for 4 years.  She denies  blood in stool or urine.  She had colonoscopy and EGD done 07/10/2018 that showed Diverticulosis and normal esophagus.  Labs done 07/10/2018 showed WBC 7.9 HB 8.8 plts 347,000.  Chemistries WNL with K+ 3.6 Cr 1.17,  Calcium 8.4 normal LFTs.  SPEP showed no monoclonal spike with only evidence of polyclonal gammopathy.  FLC ratio of 2.  Pt has history of Eczema.  Pt is seen today for consultation due to Iron deficiency anemia.    Labs done today 08/18/2018 reviewed and showed WBC 6 HB 9.6 plts 220,000.  She has a normal differential.  Chemistries WNL with K+ 4.5 Ca 8.6 Normal LFTs.   B12 and folate WNL.  Ferritin improved at 85.   HB has improved and is 9.6 on labs done today.  She was recently treated with 2 doses of IV iron.  Pt on oral  iron.  Pt will RTC in 2 weeks to go over labs.    2  Polyclonal gammopathy.  Recent SPEP showed no M-spike and no evidence of monoclonal gammopathy.  Awaiting results of SPEP, Quant Ig and FLC.  Pt will RTC to go over results.    3. Elevated Alkaline phosphatase.  Mild elevation noted.  Recent labs in February WNL.    4.  Respiratory failure.  Pt on chronic home oxygen at 4 L.  Pulse ox 100% on 4 L.  Follow-up with PCP or pulmonary as directed.   5.  HTN.  BP is 118/60.  Follow-up with PCP as directed.    6 . Health maintenance.  GI follow-up and mammogram screening as recommended.   7.  Eczema.  Pt has chronic skin lesions.  Will check ANA.  Follow-up with PCP as directed.    40 minutes spent with more than 50% spent in  review of records, counseling and coordination of care.    HPI:  58 year old female referred for evaluation due to anemia.  Patient is status post hospital admission from 07/08/2018 through 07/11/2018 secondary to severe anemia with hemoglobin of 5.8 on day of admission on 07/08/2018.  Patient with hemoglobin of 8.8 on 07/10/2018 status post transfusion.  Pt was previously treated with 2 doses of feraheme in 05/2018.  She reports frequent urination due to  lasix.   She has been on oxygen for 4 years.  She denies blood in stool or urine.  She had colonoscopy and EGD done 07/10/2018 that showed Diverticulosis and normal esophagus.  Labs done 07/10/2018 showed WBC 7.9 HB 8.8 plts 347,000.  Chemistries WNL with K+ 3.6 Cr 1.17,  Calcium 8.4 normal LFTs.  SPEP showed no monoclonal spike with evidence of  polyclonal gammopathy.  FLC ratio of 2.  Pt has history of Eczema.  Pt is seen today for consultation due to Iron deficiency anemia.    Problem List Patient Active Problem List   Diagnosis Date Noted  . Noncompliance with treatment plan [Z91.11] 07/24/2018  . General patient noncompliance [Z91.19] 07/24/2018  . Noncompliance with diet and medication regimen [Z91.11, Z91.14] 07/24/2018  . Dysarthria [R47.1]   . Morbid obesity with BMI of 60.0-69.9, adult ([place]) [E66.01, Z68.44]   . BiPAP (biphasic positive airway pressure) dependence [Z99.89]   . Acute renal failure superimposed on stage 3 chronic kidney disease ([place]) [N17.9, N18.3] 07/08/2018  . Pancytopenia ([place]) [J67.341] 07/08/2018  . Type II diabetes mellitus with renal manifestations ([place]) [E11.29] 07/08/2018  . Acute on chronic combined systolic and diastolic CHF (congestive heart failure) ([place]) [I50.43] 06/26/2018  . Hepatic steatosis [K76.0] 06/22/2018  . Chronic respiratory failure with hypoxia and hypercapnia (HCC) [J96.11, J96.12]   . Hyperammonemia ([place]) [E72.20]   . Acute on chronic diastolic CHF (congestive heart failure) ([place]) [I50.33]   . On home O2 [Z99.81]   . Slow transit constipation [K59.01]   . Encephalopathy [G93.40] 06/09/2018  . Cocaine abuse ([place]) [F14.10]   . Diabetes mellitus type 2 in obese (HCC) [E11.69, E66.9]   . Generalized tonic-clonic seizure ([place]) [P37.902] 06/05/2018  . Cerebral embolism with cerebral infarction [I63.40] 06/01/2018  . AKI (acute kidney injury) ([place]) [N17.9] 05/30/2018  . Pressure injury of skin [L89.90] 05/30/2018  . Acute on chronic anemia  [D64.9] 05/29/2018  . Chronic respiratory failure with hypoxia ([place]) [J96.11] 01/27/2018  . Iron deficiency anemia [D50.9] 01/27/2018  . Tobacco abuse disorder [Z72.0] 10/03/2017  . Dyslipidemia [E78.5] 04/29/2016  . Primary insomnia [F51.01] 04/29/2016  . Hypoxemia [R09.02] 09/08/2015  . Congestive heart failure (CHF) ([place]) [I50.9] 09/08/2015  . Obesity hypoventilation syndrome ([place]) [E66.2]   . Microcytic anemia [D50.9]   . Acute encephalopathy [G93.40]   . Uncontrolled type 2 diabetes mellitus with hyperosmolarity without coma ([place]) [E11.00]   . Acute on chronic respiratory failure with hypercapnia (HCC) [J96.22]   . CAP (community acquired pneumonia) [J18.9]   . OSA (obstructive sleep apnea) [G47.33]   . Acute and chr resp failure, unsp w hypoxia or hypercapnia ([place]) [J96.20] 06/22/2015  . Acute on chronic combined systolic (congestive) and diastolic (congestive) heart failure ([place]) [I50.43] 06/21/2015  . Hypoglycemia [E16.2] 06/21/2015  . Uncontrolled type 2 diabetes mellitus without complication, with long-term current use of insulin (HCC) [E11.65, Z79.4]   . Dyslipidemia associated with type 2 diabetes mellitus ([place]) [E11.69, E78.5] 05/21/2015  . Hyperkalemia [E87.5] 05/21/2015  . COPD GOLD III  [J44.9] 06/10/2014  .  Acute respiratory failure with hypoxia ([place]) [J96.01] 03/04/2014  . CKD (chronic kidney disease), stage II [N18.2] 02/07/2014  . COPD exacerbation ([place]) [J44.1] 02/01/2014  . CAD S/P percutaneous coronary angioplasty - prior PCI to LAD; RCA PCI: new Xience Alpine DES 2.75 mm x 15 mm  [I25.10, Z98.61] 10/07/2013  . Morbid obesity ([place]) [E66.01] 07/30/2013  . Chronic combined systolic and diastolic heart failure ([place]) [I50.42] 07/10/2013  . Chronic pulmonary embolism ([place]) [I27.82] 02/08/2013  . Bipolar disorder ([place]) [F31.9] 02/08/2013  . Essential hypertension [I10] 02/08/2013  . Chronic anticoagulation [Z79.01] 11/27/2012  . Edema extremities [R60.0] 10/15/2012   . COPD (chronic obstructive pulmonary disease) ([place]) [J44.9] 10/15/2012  . Pulmonary embolism on right ([place]) [I26.99] 10/15/2012  . Hypertension [I10] 10/15/2012  . Insulin dependent diabetes mellitus ([place]) [E11.9, Z79.4] 10/15/2012    Past Medical History Past Medical History:  Diagnosis Date  . Asthma   . CAD (coronary artery disease)   . Chronic combined systolic (congestive) and diastolic (congestive) heart failure ([place])   . Chronic iron deficiency anemia   . Diabetes mellitus without complication ([place])   . DOE (dyspnea on exertion)   . Edema of both legs   . Hepatic steatosis   . Hypertension   . Pulmonary emboli ([place]) 2014    Past Surgical History Past Surgical History:  Procedure Laterality Date  . BIOPSY  07/10/2018   Procedure: BIOPSY;  Surgeon: [name], MD;  Location: [facility];  Service: Gastroenterology;;  . COLONOSCOPY WITH PROPOFOL N/A 08/03/2013   Procedure: COLONOSCOPY WITH PROPOFOL;  Surgeon: Jeryl Columbia, MD;  Location: [facility];  Service: Endoscopy;  Laterality: N/A;  . COLONOSCOPY WITH PROPOFOL N/A 07/10/2018   Procedure: COLONOSCOPY WITH PROPOFOL;  Surgeon: [name], MD;  Location: [facility];  Service: Gastroenterology;  Laterality: N/A;  . CORONARY ANGIOPLASTY WITH STENT PLACEMENT     CAD in 2006 x 2 and 2009 2 more- place din REx in [facility]  . CORONARY ANGIOPLASTY WITH STENT PLACEMENT  10/07/2013   Xience Alpine DES 2.75  mm x 15  mm  . ESOPHAGOGASTRODUODENOSCOPY (EGD) WITH PROPOFOL N/A 08/03/2013   Procedure: ESOPHAGOGASTRODUODENOSCOPY (EGD) WITH PROPOFOL;  Surgeon: Jeryl Columbia, MD;  Location: [facility];  Service: Endoscopy;  Laterality: N/A;  . ESOPHAGOGASTRODUODENOSCOPY (EGD) WITH PROPOFOL N/A 07/10/2018   Procedure: ESOPHAGOGASTRODUODENOSCOPY (EGD) WITH PROPOFOL;  Surgeon: [name], MD;  Location: [facility];  Service: Gastroenterology;  Laterality: N/A;  . LEFT HEART CATHETERIZATION WITH CORONARY ANGIOGRAM N/A  10/07/2013   Procedure: LEFT HEART CATHETERIZATION WITH CORONARY ANGIOGRAM;  Surgeon: Leonie Man, MD;  Location: [facility] CATH LAB;  Service: Cardiovascular;  Laterality: N/A;    Family History Family History  Problem Relation Age of Onset  . Stroke Mother   . Hypertension Mother   . Colon cancer Father   . Diabetes Father   . Heart attack Father   . Sarcoidosis Other   . Breast cancer Paternal Aunt   . Emphysema Brother   . Lung disease Brother        Unknown type, 3 brothers, one with liver and lung disease  . Asthma Paternal Aunt      Social History  reports that she quit smoking about 4 years ago. Her smoking use included cigarettes. She has a 10.00 pack-year smoking history. She has never used smokeless tobacco. She reports that she does not drink alcohol or use drugs.  Medications  Current Outpatient Medications:  .  ACCU-CHEK AVIVA PLUS test  strip, USE 1 STRIP TO CHECK GLUCOSE 3 TIMES DAILY, Disp: 100 each, Rfl: 2 .  aspirin 81 MG tablet, Take 1 tablet (81 mg total) by mouth daily., Disp: , Rfl:  .  budesonide-formoterol (SYMBICORT) 80-4.5 MCG/ACT inhaler, Inhale 2 puffs into the lungs 2 times daily at 12 noon and 4 pm., Disp: 1 Inhaler, Rfl: 0 .  carvedilol (COREG) 3.125 MG tablet, Take 1 tablet (3.125 mg total) by mouth 2 (two) times daily with a meal., Disp: 60 tablet, Rfl: 0 .  ferrous gluconate (FERGON) 324 MG tablet, Take 1 tablet (324 mg total) by mouth daily with breakfast. Office visit needed prior to future refills, Disp: 30 tablet, Rfl: 0 .  furosemide (LASIX) 40 MG tablet, Take 1 tablet (40 mg total) by mouth daily for 30 days., Disp: 30 tablet, Rfl: 0 .  hydrocerin (EUCERIN) CREA, Apply 1 application topically 2 (two) times daily., Disp: , Rfl: 0 .  Insulin Detemir (LEVEMIR FLEXTOUCH) 100 UNIT/ML Pen, Inject 18 Units into the skin daily. (Patient taking differently: Inject 18 Units into the skin at bedtime. ), Disp: 15 mL, Rfl: 0 .  ipratropium-albuterol (DUONEB)  0.5-2.5 (3) MG/3ML SOLN, Use nebulizer to inhale contents of one vial every 6 hours as needed for shortness of breath, wheezing (Patient taking differently: Inhale 3 mLs into the lungs every 6 (six) hours as needed (shortness of breath or wheezing). ), Disp: 360 mL, Rfl: 0 .  NEEDLE, DISP, 26 G 26G X 1/2" MISC, 1 application by Does not apply route daily., Disp: 100 each, Rfl: 0 .  nitroGLYCERIN (NITROSTAT) 0.4 MG SL tablet, Place 1 tablet (0.4 mg total) under the tongue every 5 (five) minutes as needed for chest pain., Disp: 25 tablet, Rfl: 11 .  omeprazole (PRILOSEC) 40 MG capsule, Take 1 capsule (40 mg total) by mouth daily., Disp: 30 capsule, Rfl: 2 .  polyethylene glycol (MIRALAX) packet, Take 17 g by mouth every Monday, Wednesday, and Friday., Disp: 14 each, Rfl: 0 .  Potassium Chloride ER 20 MEQ TBCR, Take 20 mEq by mouth every Monday, Wednesday, and Friday for 30 days., Disp: 12 tablet, Rfl: 0 .  PROAIR HFA 108 (90 Base) MCG/ACT inhaler, INHALE 2 PUFFS BY MOUTH 4 TIMES DAILY AS NEEDED, Disp: 8.5 each, Rfl: 1 .  rosuvastatin (CRESTOR) 40 MG tablet, Take 1 tablet (40 mg total) by mouth daily at 6 PM., Disp: 30 tablet, Rfl: 2 .  senna-docusate (SENOKOT-S) 8.6-50 MG tablet, Take 1 tablet by mouth 2 (two) times daily., Disp: 60 tablet, Rfl: 0  Allergies Metolazone; Plavix [clopidogrel bisulfate]; Ibuprofen; and Nsaids  Review of Systems Review of Systems - Oncology ROS negative other than frequent urination reportedly due to lasix   Physical Exam  Vitals Wt Readings from Last 3 Encounters:  08/18/18 (!) 302 lb 14.4 oz (137.4 kg)  08/16/18 (!) 301 lb 3.2 oz (136.6 kg)  07/10/18 (!) 435 lb 13.6 oz (197.7 kg)   Temp Readings from Last 3 Encounters:  08/18/18 98.2 F (36.8 C) (Oral)  07/11/18 98.1 F (36.7 C) (Oral)  07/05/18 98.9 F (37.2 C) (Oral)   BP Readings from Last 3 Encounters:  08/18/18 118/60  08/16/18 (!) 144/76  07/11/18 128/70   Pulse Readings from Last 3  Encounters:  08/18/18 67  08/16/18 68  07/11/18 75   Constitutional: Well-developed, well-nourished, and in no distress.   HENT: Head: Normocephalic and atraumatic. Oxygen via .   Mouth/Throat: No oropharyngeal exudate. Mucosa moist. Eyes: Pupils are equal,  round, and reactive to light. Conjunctivae are normal. No scleral icterus.  Neck: Normal range of motion. Neck supple. No JVD present.  Cardiovascular: Normal rate, regular rhythm and normal heart sounds.  Exam reveals no gallop and no friction rub.   No murmur heard. Pulmonary/Chest: Effort normal and breath sounds normal. No respiratory distress. No wheezes.No rales.  Abdominal: Soft. Bowel sounds are normal. No distension. There is no tenderness. There is no guarding.  Musculoskeletal: No edema or tenderness.  Lymphadenopathy: No cervical,axillary or supraclavicular adenopathy.  Neurological: Alert and oriented to person, place, and time. No cranial nerve deficit.  Skin: Skin is warm and dry. Scarring noted on upper chest which pt reports is due to eczema.  Psychiatric: Affect and judgment normal.   Labs Appointment on 08/18/2018  Component Date Value Ref Range Status  . CRP 08/18/2018 1.4* <1.0 mg/dL Final   Performed at [facility] [address], [place], [place] [address]  . Vitamin B-12 08/18/2018 690  180 - 914 pg/mL Final   Comment: (NOTE) This assay is not validated for testing neonatal or myeloproliferative syndrome specimens for Vitamin B12 levels. Performed at [facility], [place] [address] [place], [place] [address]   . Folate 08/18/2018 20.9  >5.9 ng/mL Final   Performed at [facility], [place] [address] [place], [place] [address]  . Iron 08/18/2018 48  41 - 142 ug/dL Final  . TIBC 08/18/2018 333  236 - 444 ug/dL Final  . Saturation Ratios 08/18/2018 14* 21 - 57 % Final  . UIBC 08/18/2018 285  120 - 384 ug/dL Final   Performed at [facility], [place] [address], [place], [place] [address]  . Ferritin 08/18/2018 85  11 - 307 ng/mL Final   Performed at [facility], [place] [address], [place], [place] [address]  . Sed Rate 08/18/2018 90* 0 - 22 mm/hr Final   Performed at [facility] [address] [place], [place] [address]  . LDH 08/18/2018 247* 98 - 192 U/L Final   Performed at [facility], [place] [address] [place], [place] [address]  . Sodium 08/18/2018 141  135 - 145 mmol/L Final  . Potassium 08/18/2018 4.5  3.5 - 5.1 mmol/L Final  . Chloride 08/18/2018 102  98 - 111 mmol/L Final  . CO2 08/18/2018 31  22 - 32 mmol/L Final  . Glucose, Bld 08/18/2018 129* 70 - 99 mg/dL Final  . BUN 08/18/2018 17  6 - 20 mg/dL Final  . Creatinine 08/18/2018 1.32* 0.44 - 1.00 mg/dL Final  . Calcium 08/18/2018 8.6* 8.9 - 10.3 mg/dL Final  . Total Protein 08/18/2018 9.4* 6.5 - 8.1 g/dL Final  . Albumin 08/18/2018 2.9* 3.5 - 5.0 g/dL Final  . AST 08/18/2018 28  15 - 41 U/L Final  . ALT 08/18/2018 19  0 - 44 U/L Final  . Alkaline Phosphatase 08/18/2018 210* 38 - 126 U/L Final  . Total Bilirubin 08/18/2018 0.6  0.3 - 1.2 mg/dL Final  . GFR, Est Non Af Am 08/18/2018 44* >60 mL/min Final  . GFR, Est AFR Am 08/18/2018 51* >60 mL/min Final  . Anion gap 08/18/2018 8  5 - 15 Final   Performed at [facility], [place] [address] [place],  [address]  . WBC Count 08/18/2018 6.0  4.0 - 10.5 K/uL Final  . RBC 08/18/2018 3.68* 3.87 - 5.11 MIL/uL Final  .  Hemoglobin 08/18/2018 9.6* 12.0 - 15.0 g/dL Final  . HCT 08/18/2018 33.3* 36.0 - 46.0 % Final  . MCV 08/18/2018 90.5  80.0 - 100.0 fL Final  . MCH 08/18/2018 26.1  26.0 - 34.0 pg Final  . MCHC 08/18/2018 28.8* 30.0 - 36.0 g/dL Final  . RDW 08/18/2018 16.5* 11.5 - 15.5 % Final  . Platelet Count 08/18/2018 220  150 - 400 K/uL Final  . nRBC 08/18/2018 0.0  0.0 - 0.2 % Final  . Neutrophils  Relative % 08/18/2018 51  % Final  . Neutro Abs 08/18/2018 3.1  1.7 - 7.7 K/uL Final  . Lymphocytes Relative 08/18/2018 32  % Final  . Lymphs Abs 08/18/2018 1.9  0.7 - 4.0 K/uL Final  . Monocytes Relative 08/18/2018 13  % Final  . Monocytes Absolute 08/18/2018 0.8  0.1 - 1.0 K/uL Final  . Eosinophils Relative 08/18/2018 3  % Final  . Eosinophils Absolute 08/18/2018 0.2  0.0 - 0.5 K/uL Final  . Basophils Relative 08/18/2018 1  % Final  . Basophils Absolute 08/18/2018 0.1  0.0 - 0.1 K/uL Final  . Immature Granulocytes 08/18/2018 0  % Final  . Abs Immature Granulocytes 08/18/2018 0.01  0.00 - 0.07 K/uL Final   Performed at [facility], [place] [address] [place], [place] [address]     Pathology Orders Placed This Encounter  Procedures  . CBC with Differential (Cancer Center Only)    Standing Status:   Future    Number of Occurrences:   1    Standing Expiration Date:   08/18/2019  . CMP ([place] only)    Standing Status:   Future    Number of Occurrences:   1    Standing Expiration Date:   08/18/2019  . Lactate dehydrogenase (LDH)    Standing Status:   Future    Number of Occurrences:   1    Standing Expiration Date:   08/18/2019  . Sedimentation rate    Standing Status:   Future    Number of Occurrences:   1    Standing Expiration Date:   08/18/2019  . Ferritin    Standing Status:   Future    Number of Occurrences:   1    Standing Expiration Date:   08/18/2019  . Iron and TIBC    Standing Status:   Future    Number of Occurrences:   1    Standing Expiration Date:   08/18/2019  . Folate, Serum    Standing Status:   Future    Number of Occurrences:   1    Standing Expiration Date:   08/18/2019  . Vitamin B12    Standing Status:   Future    Number of Occurrences:   1    Standing Expiration Date:   08/18/2019  . Hemoglobinopathy evaluation    Standing Status:   Future    Number of Occurrences:   1    Standing Expiration Date:   08/18/2019  .  Haptoglobin    Standing Status:   Future    Number of Occurrences:   1    Standing Expiration Date:   08/18/2019  . Methylmalonic acid, serum    Standing Status:   Future    Number of Occurrences:   1    Standing Expiration Date:   08/18/2019  . Kappa/lambda light chains    Standing Status:   Future    Number of Occurrences:   1  Standing Expiration Date:   08/18/2019  . SPEP with reflex to IFE    Standing Status:   Future    Number of Occurrences:   1    Standing Expiration Date:   08/18/2019  . QIG  (Quant. immunoglobulins  - IgG, IgA, IgM)    Standing Status:   Future    Number of Occurrences:   1    Standing Expiration Date:   08/18/2019  . ANA, IFA (with reflex)    Standing Status:   Future    Number of Occurrences:   1    Standing Expiration Date:   08/18/2019  . C-reactive protein    Standing Status:   Future    Number of Occurrences:   1    Standing Expiration Date:   08/18/2019       Zoila Shutter MD

## 2018-08-19 LAB — HAPTOGLOBIN: Haptoglobin: 26 mg/dL — ABNORMAL LOW (ref 33–346)

## 2018-08-19 LAB — IGG, IGA, IGM
IgA: 429 mg/dL — ABNORMAL HIGH (ref 87–352)
IgG (Immunoglobin G), Serum: 3496 mg/dL — ABNORMAL HIGH (ref 700–1600)
IgM (Immunoglobulin M), Srm: 276 mg/dL — ABNORMAL HIGH (ref 26–217)

## 2018-08-21 LAB — PROTEIN ELECTROPHORESIS, SERUM, WITH REFLEX
A/G Ratio: 0.6 — ABNORMAL LOW (ref 0.7–1.7)
Albumin ELP: 3.3 g/dL (ref 2.9–4.4)
Alpha-1-Globulin: 0.3 g/dL (ref 0.0–0.4)
Alpha-2-Globulin: 0.6 g/dL (ref 0.4–1.0)
Beta Globulin: 1.2 g/dL (ref 0.7–1.3)
Gamma Globulin: 3.3 g/dL — ABNORMAL HIGH (ref 0.4–1.8)
Globulin, Total: 5.4 g/dL — ABNORMAL HIGH (ref 2.2–3.9)
Total Protein ELP: 8.7 g/dL — ABNORMAL HIGH (ref 6.0–8.5)

## 2018-08-21 LAB — HEMOGLOBINOPATHY EVALUATION
Hgb A2 Quant: 2.1 % (ref 1.8–3.2)
Hgb A: 97.9 % (ref 96.4–98.8)
Hgb C: 0 %
Hgb F Quant: 0 % (ref 0.0–2.0)
Hgb S Quant: 0 %
Hgb Variant: 0 %

## 2018-08-21 LAB — KAPPA/LAMBDA LIGHT CHAINS
Kappa free light chain: 183.1 mg/L — ABNORMAL HIGH (ref 3.3–19.4)
Kappa, lambda light chain ratio: 2.23 — ABNORMAL HIGH (ref 0.26–1.65)
Lambda free light chains: 82 mg/L — ABNORMAL HIGH (ref 5.7–26.3)

## 2018-08-21 LAB — ANTINUCLEAR ANTIBODIES, IFA: ANA Ab, IFA: NEGATIVE

## 2018-08-21 NOTE — Telephone Encounter (Signed)
Follow up   Tamara Silva calling from [facility] states the pt declined visits due to the [place] virus for Friday 3/20 and today 3/23

## 2018-08-21 NOTE — Addendum Note (Signed)
Addended by: Jacqulynn Cadet on: 08/21/2018 02:09 PM   Modules accepted: Orders

## 2018-08-22 LAB — METHYLMALONIC ACID, SERUM: Methylmalonic Acid, Quantitative: 583 nmol/L — ABNORMAL HIGH (ref 0–378)

## 2018-08-23 ENCOUNTER — Inpatient Hospital Stay: Payer: Self-pay | Admitting: Adult Health

## 2018-08-23 ENCOUNTER — Ambulatory Visit: Payer: Self-pay | Admitting: Neurology

## 2018-08-24 ENCOUNTER — Telehealth: Payer: Self-pay | Admitting: Family Medicine

## 2018-08-24 NOTE — Telephone Encounter (Signed)
Tamara Silva with [facility] called to inform of missed visit. Patient declined the visit due to the [place]. Please follow up.

## 2018-08-28 ENCOUNTER — Telehealth: Payer: Self-pay

## 2018-08-28 NOTE — Telephone Encounter (Signed)
Tamara Silva, [facility] form [facility] called stating that he was unable to see the patient the week of March 23rd. Patient had appt on Wednesday and refused visit on Thursday and didn't answer the phone on Friday. Tamara Silva said he will do a missed visit.

## 2018-09-04 ENCOUNTER — Telehealth: Payer: Self-pay

## 2018-09-04 NOTE — Telephone Encounter (Signed)
Message received from Jeris Penta, EMT stating that the patient is requesting gabapentin refill.  It's not on her medication list.  Nira Conn received the call from the patient but is not seeing the patient as the patient refused her services in the past. Informed her that PCP would be notified.

## 2018-09-04 NOTE — Telephone Encounter (Signed)
I will have to check her hospital discharge list

## 2018-09-06 ENCOUNTER — Inpatient Hospital Stay: Payer: Medicaid Other | Attending: Internal Medicine | Admitting: Internal Medicine

## 2018-09-06 DIAGNOSIS — E538 Deficiency of other specified B group vitamins: Secondary | ICD-10-CM

## 2018-09-06 DIAGNOSIS — D472 Monoclonal gammopathy: Secondary | ICD-10-CM | POA: Diagnosis not present

## 2018-09-06 DIAGNOSIS — R899 Unspecified abnormal finding in specimens from other organs, systems and tissues: Secondary | ICD-10-CM

## 2018-09-06 NOTE — Progress Notes (Signed)
Virtual Visit via Telephone Note  I connected with Tamara Silva on 09/06/18 at 10:30 AM EDT by telephone and verified that I am speaking with the correct person using two identifiers.   I discussed the limitations, risks, security and privacy concerns of performing an evaluation and management service by telephone and the availability of in person appointments. I also discussed with the patient that there may be a patient responsible charge related to this service. The patient expressed understanding and agreed to proceed.   Interval History:  Historical data obtained from note dated 08/18/2018.  58 year old female referred for evaluation due to anemia.  Patient is status post hospital admission from 07/08/2018 through 07/11/2018 secondary to severe anemia with hemoglobin of 5.8 on day of admission on 07/08/2018.  Patient with hemoglobin of 8.8 on 07/10/2018 status post transfusion.  Pt was previously treated with 2 doses of feraheme in 05/2018.  She reports frequent urination due to lasix.   She has been on oxygen for 4 years.  She denies blood in stool or urine.  She had colonoscopy and EGD done 07/10/2018 that showed Diverticulosis and normal esophagus.  Labs done 07/10/2018 showed WBC 7.9 HB 8.8 plts 347,000.  Chemistries WNL with K+ 3.6 Cr 1.17,  Calcium 8.4 normal LFTs.  SPEP showed no monoclonal spike with evidence of  polyclonal gammopathy.  FLC ratio of 2.  Pt has history of Eczema.     Observations/Objective: Review of labs done 08/18/2018.     Assessment and Plan:  1.  Iron deficiency anemia.  58 year old female referred for evaluation due to anemia.  Patient is status post hospital admission from 07/08/2018 through 07/11/2018 secondary to severe anemia with hemoglobin of 5.8 on day of admission on 07/08/2018.  Patient with hemoglobin of 8.8 on 07/10/2018 status post transfusion.  Pt was previously treated with 2 doses of feraheme in 05/2018.  She reports frequent urination due to lasix.   She has been on  oxygen for 4 years.  She denies blood in stool or urine.  She had colonoscopy and EGD done 07/10/2018 that showed Diverticulosis and normal esophagus.  Labs done 07/10/2018 showed WBC 7.9 HB 8.8 plts 347,000.  Chemistries WNL with K+ 3.6 Cr 1.17,  Calcium 8.4 normal LFTs.  SPEP showed no monoclonal spike with only evidence of polyclonal gammopathy.  FLC ratio of 2.  Pt has history of Eczema.  Pt is seen today for consultation due to Iron deficiency anemia.    Labs done  08/18/2018 reviewed and showed WBC 6 HB 9.6 plts 220,000.  She has a normal differential.  Chemistries WNL with K+ 4.5 Ca 8.6 Normal LFTs.   B12 and folate WNL.  Ferritin improved at 85.   HB has improved and is 9.6 on labs done 08/18/2018.  She was recently treated with 2 doses of IV iron.  Pt on oral  iron.  Pt will RTC in 09/2018 with repeat labs.    2  Polyclonal gammopathy.  Recent SPEP showed no M-spike and no evidence of monoclonal gammopathy.  Repeat SPEP done 08/18/2018 showed no monoclonal protein.  FLC ratio stable at 2.23.  Will check 24 hour UPEP with IFix and light chains on RTC in 09/2018.   3.  B12 deficiency.  Pt had elevated MMA of 583 which is consistent with B12 deficiency.  Pt given option of B12 injections but desires to take oral B12.  Will continue to follow labs. Pt advised to take daily multivitamin.  4. Elevated Alkaline phosphatase.  Mild elevation noted.  Recent labs in February WNL.  Will repeat labs in 09/2018.    5.  Respiratory failure.  Pt on chronic home oxygen at 4 L.  Pulse ox 100% on 4 L.  Follow-up with PCP or pulmonary as directed.   6.  HTN.  BP was 118/60.  Follow-up with PCP as directed.    7.  Eczema.  Pt has chronic skin lesions.  Labs done 08/18/2018 showed negative ANA.  Follow-up with PCP as directed.    8.   Health maintenance.  GI follow-up and mammogram screening as recommended.   Follow Up Instructions: Pt will RTC in 09/2018 with labs and urine collection.    I discussed the assessment  and treatment plan with the patient. The patient was provided an opportunity to ask questions and all were answered. The patient agreed with the plan and demonstrated an understanding of the instructions.   The patient was advised to call back or seek an in-person evaluation if the symptoms worsen or if the condition fails to improve as anticipated.  I provided 15 minutes of non-face-to-face time during this encounter.   Zoila Shutter, MD

## 2018-09-07 ENCOUNTER — Telehealth: Payer: Self-pay | Admitting: Internal Medicine

## 2018-09-07 NOTE — Telephone Encounter (Signed)
Tried to reach regarding 5/13

## 2018-09-12 ENCOUNTER — Telehealth: Payer: Self-pay | Admitting: Family Medicine

## 2018-09-12 NOTE — Telephone Encounter (Signed)
[facility] is call to request medications be sent to them  1) Medication(s) Requested (by name): furosemide (LASIX) 40 MG tablet  And a blood pressure that they do not know the name to  2) [facility] on [address]  3) Special Requests:   Approved medications will be sent to the pharmacy, we will reach out if there is an issue.  Requests made after 3pm may not be addressed until the following business day!  If a patient is unsure of the name of the medication(s) please note and ask patient to call back when they are able to provide all info, do not send to responsible party until all information is available!

## 2018-09-13 NOTE — Telephone Encounter (Signed)
Pt was instructed to get her heart meds refilled by her cardiologist moving forward. The pharmacy was given the information for her cardiologist Dr. Ancil Linsey @ [place].

## 2018-09-18 ENCOUNTER — Telehealth: Payer: Self-pay

## 2018-09-18 ENCOUNTER — Ambulatory Visit: Payer: Self-pay | Admitting: Family Medicine

## 2018-09-18 ENCOUNTER — Other Ambulatory Visit: Payer: Self-pay | Admitting: Cardiovascular Disease

## 2018-09-18 ENCOUNTER — Telehealth: Payer: Self-pay | Admitting: Family Medicine

## 2018-09-18 ENCOUNTER — Telehealth (HOSPITAL_COMMUNITY): Payer: Self-pay

## 2018-09-18 NOTE — Telephone Encounter (Signed)
I have not seen patient recently. Please have her contact her cardiologist regarding her lasix, coreg and potassium. I am also sending this to [facility] to see if she can set up patient with tele-visit later this week and other meds can be reviewed and ordered

## 2018-09-18 NOTE — Telephone Encounter (Signed)
Message received from Jeris Penta, EMT noting that the patient is requesting refills for: Potassium Coreg Crestor Lasix Duoneb Novolog.( this is not on her medication list)   Informed Nira Conn that Dr Chapman Fitch would be notified of the request.

## 2018-09-18 NOTE — Telephone Encounter (Signed)
Pt calling requesting a refill on furosemide, sent to [facility]. Please address

## 2018-09-18 NOTE — Telephone Encounter (Signed)
Tamara Silva, EMT informed that as per Dr Chapman Fitch, patient needs to contact cardiology for refills of lasix, coreg and potassium and televisit has been scheduled for patient with Dr Chapman Fitch for this afternoon.

## 2018-09-18 NOTE — Telephone Encounter (Signed)
Spoke with patient and informed her with what provider stated. Patient verbalized understanding and an appointment was made for today with Dr. Chapman Fitch.

## 2018-09-18 NOTE — Telephone Encounter (Signed)
1) Medication(s) Requested (by name): Potassium crestor anoro Albuterol inhaler 2) Pharmacy [facility]: [facility]  [address] 3) Special Requests:   Approved medications will be sent to the pharmacy, we will reach out if there is an issue.  Requests made after 3pm may not be addressed until the following business day!  If a patient is unsure of the name of the medication(s) please note and ask patient to call back when they are able to provide all info, do not send to responsible party until all information is available!

## 2018-09-18 NOTE — Telephone Encounter (Signed)
Tamara Silva continues to reach out to me to get her medications filled. I advised her that I can only make a request that her medications be refilled but it will be up to the physician to get these approved for refill.   She has been removed from paramedicine program due to refusing services a few months ago.

## 2018-09-19 MED ORDER — ALBUTEROL SULFATE HFA 108 (90 BASE) MCG/ACT IN AERS: INHALATION_SPRAY | RESPIRATORY_TRACT | 2 refills | Status: DC

## 2018-09-19 MED ORDER — ROSUVASTATIN CALCIUM 40 MG PO TABS: 40.0000 mg | ORAL_TABLET | Freq: Every day | ORAL | 2 refills | Status: DC

## 2018-09-22 ENCOUNTER — Telehealth (HOSPITAL_COMMUNITY): Payer: Self-pay | Admitting: Adult Health

## 2018-09-22 NOTE — Telephone Encounter (Signed)
On 08/28/18 - I called the patient at the home phone number [phone] / patient sounded as if she was either asleep or not feeling well. She said she didn't want to schedule a visit.

## 2018-09-29 DIAGNOSIS — J9690 Respiratory failure, unspecified, unspecified whether with hypoxia or hypercapnia: Secondary | ICD-10-CM

## 2018-09-29 HISTORY — DX: Respiratory failure, unspecified, unspecified whether with hypoxia or hypercapnia: J96.90

## 2018-10-04 ENCOUNTER — Other Ambulatory Visit: Payer: Self-pay | Admitting: Internal Medicine

## 2018-10-04 ENCOUNTER — Ambulatory Visit: Payer: Medicaid Other | Attending: Family Medicine | Admitting: Family Medicine

## 2018-10-04 ENCOUNTER — Encounter: Payer: Self-pay | Admitting: Family Medicine

## 2018-10-04 ENCOUNTER — Other Ambulatory Visit: Payer: Self-pay

## 2018-10-04 DIAGNOSIS — I5042 Chronic combined systolic (congestive) and diastolic (congestive) heart failure: Secondary | ICD-10-CM | POA: Diagnosis not present

## 2018-10-04 DIAGNOSIS — D509 Iron deficiency anemia, unspecified: Secondary | ICD-10-CM

## 2018-10-04 DIAGNOSIS — Z9119 Patient's noncompliance with other medical treatment and regimen: Secondary | ICD-10-CM

## 2018-10-04 DIAGNOSIS — E1122 Type 2 diabetes mellitus with diabetic chronic kidney disease: Secondary | ICD-10-CM

## 2018-10-04 DIAGNOSIS — Z91199 Patient's noncompliance with other medical treatment and regimen due to unspecified reason: Secondary | ICD-10-CM

## 2018-10-04 DIAGNOSIS — I25119 Atherosclerotic heart disease of native coronary artery with unspecified angina pectoris: Secondary | ICD-10-CM | POA: Diagnosis not present

## 2018-10-04 DIAGNOSIS — N182 Chronic kidney disease, stage 2 (mild): Secondary | ICD-10-CM

## 2018-10-04 DIAGNOSIS — J449 Chronic obstructive pulmonary disease, unspecified: Secondary | ICD-10-CM | POA: Diagnosis not present

## 2018-10-04 DIAGNOSIS — K219 Gastro-esophageal reflux disease without esophagitis: Secondary | ICD-10-CM

## 2018-10-04 DIAGNOSIS — K5900 Constipation, unspecified: Secondary | ICD-10-CM

## 2018-10-04 DIAGNOSIS — G4733 Obstructive sleep apnea (adult) (pediatric): Secondary | ICD-10-CM

## 2018-10-04 DIAGNOSIS — R899 Unspecified abnormal finding in specimens from other organs, systems and tissues: Secondary | ICD-10-CM

## 2018-10-04 DIAGNOSIS — Z794 Long term (current) use of insulin: Secondary | ICD-10-CM

## 2018-10-04 MED ORDER — POTASSIUM CHLORIDE ER 20 MEQ PO TBCR: 20.0000 meq | EXTENDED_RELEASE_TABLET | ORAL | 4 refills | Status: DC

## 2018-10-04 MED ORDER — CARVEDILOL 3.125 MG PO TABS: 3.1250 mg | ORAL_TABLET | Freq: Two times a day (BID) | ORAL | 0 refills | Status: DC

## 2018-10-04 MED ORDER — ALBUTEROL SULFATE HFA 108 (90 BASE) MCG/ACT IN AERS: INHALATION_SPRAY | RESPIRATORY_TRACT | 2 refills | Status: DC

## 2018-10-04 MED ORDER — BUDESONIDE-FORMOTEROL FUMARATE 80-4.5 MCG/ACT IN AERO: 2.0000 | INHALATION_SPRAY | Freq: Two times a day (BID) | RESPIRATORY_TRACT | 0 refills | Status: DC

## 2018-10-04 MED ORDER — INSULIN DETEMIR 100 UNIT/ML FLEXPEN: 18.0000 [IU] | PEN_INJECTOR | Freq: Every day | SUBCUTANEOUS | 4 refills | Status: DC

## 2018-10-04 MED ORDER — OMEPRAZOLE 40 MG PO CPDR: 40.0000 mg | DELAYED_RELEASE_CAPSULE | Freq: Every day | ORAL | 4 refills | Status: DC

## 2018-10-04 MED ORDER — FERROUS GLUCONATE 324 (38 FE) MG PO TABS: 324.0000 mg | ORAL_TABLET | Freq: Every day | ORAL | 0 refills | Status: DC

## 2018-10-04 MED ORDER — ROSUVASTATIN CALCIUM 40 MG PO TABS: 40.0000 mg | ORAL_TABLET | Freq: Every day | ORAL | 4 refills | Status: DC

## 2018-10-04 MED ORDER — POLYETHYLENE GLYCOL 3350 17 G PO PACK: 17.0000 g | PACK | ORAL | 11 refills | Status: DC

## 2018-10-04 MED ORDER — IPRATROPIUM-ALBUTEROL 0.5-2.5 (3) MG/3ML IN SOLN: RESPIRATORY_TRACT | 11 refills | Status: DC

## 2018-10-04 MED ORDER — FUROSEMIDE 40 MG PO TABS: 40.0000 mg | ORAL_TABLET | Freq: Every day | ORAL | 0 refills | Status: DC

## 2018-10-04 MED ORDER — SENNOSIDES-DOCUSATE SODIUM 8.6-50 MG PO TABS: 1.0000 | ORAL_TABLET | Freq: Two times a day (BID) | ORAL | 4 refills | Status: DC

## 2018-10-04 MED ORDER — NITROGLYCERIN 0.4 MG SL SUBL: 0.4000 mg | SUBLINGUAL_TABLET | SUBLINGUAL | 11 refills | Status: DC | PRN

## 2018-10-04 NOTE — Progress Notes (Signed)
Virtual Visit via Telephone Note  I connected with Tamara Silva on 10/04/18 at 11:10 AM EDT by telephone and verified that I am speaking with the correct person using two identifiers.   I discussed the limitations, risks, security and privacy concerns of performing an evaluation and management service by telephone and the availability of in person appointments. I also discussed with the patient that there may be a patient responsible charge related to this service. The patient expressed understanding and agreed to proceed.  Patient Location: Home Provider Location: Office Others participating in call: Call initiated by Emilio Aspen, RMA   History of Present Illness:      58 year old female with multiple medical issues status post hospitalization from 07/08/2018 through 07/11/2018 due to complaint of fatigue and feeling cold and she was found to have a hemoglobin of 7.9 on 07/05/2018 for which she received blood transfusions and had EGD/colonoscopy in follow-up of acute on chronic iron deficiency anemia.  There was no evidence of acute bleeding found on EGD colonoscopy.  Since her hospitalization, patient has had follow-up with hematology regarding her anemia and follow-up of other blood work done during hospitalization.  She also cardiology regarding her coronary artery disease status post stenting but states that she has not yet followed up with  heart failure clinic.  She also followed up with nephrology status post hospitalization as she also had a mild increase in her creatinine during her February hospitalization which was thought to be secondary to dehydration and anemia.      Patient reports that she needs refills of her medications and that she has changed her pharmacy.  She hates taking medication for her blood sugars.  She does monitor her blood sugars but not on a daily basis.  She feels that she is stable at this time.  Patient continues to take her fluid pill to help with her leg  swelling/CHF.  She denies any chest pain related to her heart disease.  Patient continues to use her respiratory medications and use her oxygen.  She tries to remain compliant with nighttime CPAP use.  Her reflux is controlled with the use of medication.  She denies any unusual bruising or bleeding, no melena or blood in the stool related to her anemia.  She reports that she is taking an iron supplement.  She is having issues with constipation as well and would like medication to help with this.   Past Medical History:  Diagnosis Date  . Asthma   . CAD (coronary artery disease)   . Chronic combined systolic (congestive) and diastolic (congestive) heart failure ([place])   . Chronic iron deficiency anemia   . Diabetes mellitus without complication ([place])   . DOE (dyspnea on exertion)   . Edema of both legs   . Hepatic steatosis   . Hypertension   . Pulmonary emboli ([place]) 2014    Past Surgical History:  Procedure Laterality Date  . BIOPSY  07/10/2018   Procedure: BIOPSY;  Surgeon: [name], MD;  Location: [facility];  Service: Gastroenterology;;  . COLONOSCOPY WITH PROPOFOL N/A 08/03/2013   Procedure: COLONOSCOPY WITH PROPOFOL;  Surgeon: Jeryl Columbia, MD;  Location: [facility];  Service: Endoscopy;  Laterality: N/A;  . COLONOSCOPY WITH PROPOFOL N/A 07/10/2018   Procedure: COLONOSCOPY WITH PROPOFOL;  Surgeon: [name], MD;  Location: [facility];  Service: Gastroenterology;  Laterality: N/A;  . CORONARY ANGIOPLASTY WITH STENT PLACEMENT     CAD in 2006 x 2 and 2009 2 more-  place din REx in [place] and [place] med  . CORONARY ANGIOPLASTY WITH STENT PLACEMENT  10/07/2013   Xience [place] DES 2.75  mm x 15  mm  . ESOPHAGOGASTRODUODENOSCOPY (EGD) WITH PROPOFOL N/A 08/03/2013   Procedure: ESOPHAGOGASTRODUODENOSCOPY (EGD) WITH PROPOFOL;  Surgeon: Jeryl Columbia, MD;  Location: [facility];  Service: Endoscopy;  Laterality: N/A;  . ESOPHAGOGASTRODUODENOSCOPY (EGD) WITH PROPOFOL N/A 07/10/2018    Procedure: ESOPHAGOGASTRODUODENOSCOPY (EGD) WITH PROPOFOL;  Surgeon: [name], MD;  Location: [facility];  Service: Gastroenterology;  Laterality: N/A;  . LEFT HEART CATHETERIZATION WITH CORONARY ANGIOGRAM N/A 10/07/2013   Procedure: LEFT HEART CATHETERIZATION WITH CORONARY ANGIOGRAM;  Surgeon: Leonie Man, MD;  Location: [facility] C[facility] LAB;  Service: Cardiovascular;  Laterality: N/A;    Family History  Problem Relation Age of Onset  . Stroke Mother   . Hypertension Mother   . Colon cancer Father   . Diabetes Father   . Heart attack Father   . Sarcoidosis Other   . Breast cancer Paternal Aunt   . Emphysema Brother   . Lung disease Brother        Unknown type, 3 brothers, one with liver and lung disease  . Asthma Paternal Aunt     Social History   Tobacco Use  . Smoking status: Former Smoker    Packs/day: 0.50    Years: 20.00    Pack years: 10.00    Types: Cigarettes    Last attempt to quit: 04/30/2014    Years since quitting: 4.4  . Smokeless tobacco: Never Used  . Tobacco comment: smoked off and on x 20 years  Substance Use Topics  . Alcohol use: No    Alcohol/week: 0.0 standard drinks  . Drug use: No     Allergies  Allergen Reactions  . Metolazone Other (See Comments)    Dizziness and falling  . Plavix [Clopidogrel Bisulfate] Other (See Comments)    High PRU's - non-responder  . Ibuprofen Other (See Comments)    Pt states she is not supposed to take ibuprofen because of other meds she is taking  . Nsaids Other (See Comments)    "I do not take NSAIDs d/t interfering with other meds"    Review of Systems  Constitutional: Positive for malaise/fatigue. Negative for chills and fever.  HENT: Negative for congestion and sore throat.   Eyes: Negative for blurred vision and double vision.  Respiratory: Negative for cough and shortness of breath.   Cardiovascular: Negative for chest pain and palpitations.  Gastrointestinal: Positive for constipation. Negative for  abdominal pain, blood in stool, diarrhea, heartburn, melena, nausea and vomiting.  Genitourinary: Negative for dysuria and hematuria.  Musculoskeletal: Positive for back pain and myalgias.  Neurological: Negative for dizziness and headaches.  Endo/Heme/Allergies: Negative for polydipsia. Does not bruise/bleed easily.     Observations/Objective: No vital signs or physical exam conducted as visit was done via telephone due to limitations and restrictions on in- person/in-office visits due to the current COVID-19 pandemic  Assessment and Plan: 1. Chronic obstructive pulmonary disease, unspecified COPD type ([place]) with oxygen dependence Refills provided of Symbicort, DuoNeb and albuterol inhaler as needed.  Continued compliance with continuous oxygen therapy also encouraged. - albuterol (PROAIR HFA) 108 (90 Base) MCG/ACT inhaler; INHALE 2 PUFFS BY MOUTH 4 TIMES DAILY AS NEEDED  Dispense: 8.5 each; Refill: 2 - budesonide-formoterol (SYMBICORT) 80-4.5 MCG/ACT inhaler; Inhale 2 puffs into the lungs 2 times daily at 12 noon and 4 pm.  Dispense: 1 Inhaler; Refill: 0 -  ipratropium-albuterol (DUONEB) 0.5-2.5 (3) MG/3ML SOLN; Use nebulizer to inhale contents of one vial every 6 hours as needed for shortness of breath, wheezing  Dispense: 360 mL; Refill: 11  2. Chronic combined systolic and diastolic congestive heart failure, NYHA class 2; EF 30-35% She was encouraged to keep follow-up with heart failure clinic conducted by cardiology to which she has been referred by myself as well as by her cardiologist.  She is provided with refills of her Coreg, Lasix and potassium chloride.  She is encouraged to return to clinic in approximately 6 to 8 weeks for BMP and follow-up of medication use and monitoring of daily weights is strongly encouraged - carvedilol (COREG) 3.125 MG tablet; Take 1 tablet (3.125 mg total) by mouth 2 (two) times daily with a meal.  Dispense: 60 tablet; Refill: 0 - furosemide (LASIX) 40 MG  tablet; Take 1 tablet (40 mg total) by mouth daily for 30 days.  Dispense: 30 tablet; Refill: 0 - Potassium Chloride ER 20 MEQ TBCR; Take 20 mEq by mouth every Monday, Wednesday, and Friday for 30 days.  Dispense: 12 tablet; Refill: 4 - Basic Metabolic Panel; Future  3. Coronary artery disease involving native coronary artery of native heart with angina pectoris [facility]) Continue cardiology follow-up.  New prescriptions provided for Crestor and nitroglycerin.  Discussed the importance of control of factors that increase her risk for additional heart disease - rosuvastatin (CRESTOR) 40 MG tablet; Take 1 tablet (40 mg total) by mouth daily at 6 PM.  Dispense: 30 tablet; Refill: 4 - nitroGLYCERIN (NITROSTAT) 0.4 MG SL tablet; Place 1 tablet (0.4 mg total) under the tongue every 5 (five) minutes as needed for chest pain.  Dispense: 25 tablet; Refill: 11  4. Iron deficiency anemia, unspecified iron deficiency anemia type Refill provided for iron replacement therapy and patient is encouraged to follow-up with GI regarding 61-month colonoscopy after her last hospitalization as well as any follow-up that has been scheduled with hematology.  Patient will have CBC at her next office visit in follow-up of her anemia or can return for separate lab visit in 6 to 8 weeks - ferrous gluconate (FERGON) 324 MG tablet; Take 1 tablet (324 mg total) by mouth daily with breakfast. Office visit needed prior to future refills  Dispense: 30 tablet; Refill: 0 - CBC with Differential; Future  5. OSA (obstructive sleep apnea) Nightly compliance with CPAP as well as CPAP use whenever she is sleeping/napping was again strongly encouraged  6. General patient noncompliance She continues to have issues with daily medication compliance, compliance with CPAP as well as continued noncompliance with scheduled office visits for follow-up.  This has been discussed and explained to the patient on several occasions that her actions are  detrimental to her long-term health   7. Gastroesophageal reflux disease, esophagitis presence not specified Patient encouraged to avoid known trigger foods such as foods that are spicy or greasy in addition to avoidance of late night eating.  Refill provided for omeprazole for continued control of reflux - omeprazole (PRILOSEC) 40 MG capsule; Take 1 capsule (40 mg total) by mouth daily.  Dispense: 30 capsule; Refill: 4  8. Constipation, unspecified constipation type Patient with come complaint of constipation and patient is provided with refills of Senokot and MiraLAX which she had been prescribed in the past but is not currently taking.  She is also encouraged to increase fiber in her diet - senna-docusate (SENOKOT-S) 8.6-50 MG tablet; Take 1 tablet by mouth 2 (two) times daily. If  needed for constipation  Dispense: 60 tablet; Refill: 4 - polyethylene glycol (MIRALAX) 17 g packet; Take 17 g by mouth every Monday, Wednesday, and Friday.  Dispense: 14 each; Refill: 11  9. Type 2 diabetes mellitus with stage 2 chronic kidney disease, with long-term current use of insulin ([place]) Patient with hemoglobin A1c of 7.1 in January 2020.  Patient will continue the use of insulin therapy as well as monitoring of her blood sugars.  Low carbohydrate diet encouraged.  Patient was asked to come into the office in about 6 weeks for hemoglobin A1c and BMP however patient states that she is not sure when she will return to the office due to her concerns over COVID-19 - Hemoglobin A1c; Future - Basic Metabolic Panel; Future  Follow Up Instructions:    I discussed the assessment and treatment plan with the patient. The patient was provided an opportunity to ask questions and all were answered. The patient agreed with the plan and demonstrated an understanding of the instructions.   The patient was advised to call back or seek an in-person evaluation if the symptoms worsen or if the condition fails to improve as  anticipated.  I provided 16 minutes of non-face-to-face time during this encounter.   Antony Blackbird, MD

## 2018-10-04 NOTE — Progress Notes (Signed)
271 was her blood sugar at home this morning  186 was her blood sugar yesterday at home  Per pt she needs refills for all of her medications   Per pt she saw her Cardiologist 2 months ago.

## 2018-10-06 ENCOUNTER — Encounter: Payer: Self-pay | Admitting: Family Medicine

## 2018-10-11 ENCOUNTER — Inpatient Hospital Stay: Payer: Medicaid Other | Attending: Internal Medicine | Admitting: Internal Medicine

## 2018-10-11 ENCOUNTER — Inpatient Hospital Stay: Payer: Medicaid Other

## 2018-10-12 ENCOUNTER — Telehealth: Payer: Self-pay | Admitting: Internal Medicine

## 2018-10-12 NOTE — Telephone Encounter (Signed)
Called and spoke with daughter per 5/13 sch message - pt daughter states she will call mom to let her know she needs to call us back to reschedule appt

## 2018-10-25 ENCOUNTER — Other Ambulatory Visit: Payer: Self-pay

## 2018-10-25 ENCOUNTER — Encounter (HOSPITAL_COMMUNITY): Payer: Self-pay | Admitting: Emergency Medicine

## 2018-10-25 ENCOUNTER — Inpatient Hospital Stay (HOSPITAL_COMMUNITY)
Admission: EM | Admit: 2018-10-25 | Discharge: 2018-11-07 | DRG: 286 | Disposition: A | Discharge status: Home Health | Payer: Medicaid Other | Attending: Internal Medicine | Admitting: Internal Medicine

## 2018-10-25 ENCOUNTER — Emergency Department (HOSPITAL_COMMUNITY): Payer: Medicaid Other

## 2018-10-25 DIAGNOSIS — F17200 Nicotine dependence, unspecified, uncomplicated: Secondary | ICD-10-CM | POA: Diagnosis present

## 2018-10-25 DIAGNOSIS — Z823 Family history of stroke: Secondary | ICD-10-CM

## 2018-10-25 DIAGNOSIS — E1122 Type 2 diabetes mellitus with diabetic chronic kidney disease: Secondary | ICD-10-CM | POA: Diagnosis present

## 2018-10-25 DIAGNOSIS — I5042 Chronic combined systolic (congestive) and diastolic (congestive) heart failure: Secondary | ICD-10-CM

## 2018-10-25 DIAGNOSIS — N182 Chronic kidney disease, stage 2 (mild): Secondary | ICD-10-CM | POA: Diagnosis not present

## 2018-10-25 DIAGNOSIS — I469 Cardiac arrest, cause unspecified: Secondary | ICD-10-CM | POA: Diagnosis not present

## 2018-10-25 DIAGNOSIS — D638 Anemia in other chronic diseases classified elsewhere: Secondary | ICD-10-CM | POA: Diagnosis present

## 2018-10-25 DIAGNOSIS — J9622 Acute and chronic respiratory failure with hypercapnia: Secondary | ICD-10-CM | POA: Diagnosis present

## 2018-10-25 DIAGNOSIS — J441 Chronic obstructive pulmonary disease with (acute) exacerbation: Secondary | ICD-10-CM | POA: Diagnosis present

## 2018-10-25 DIAGNOSIS — I69998 Other sequelae following unspecified cerebrovascular disease: Secondary | ICD-10-CM | POA: Diagnosis not present

## 2018-10-25 DIAGNOSIS — N183 Chronic kidney disease, stage 3 (moderate): Secondary | ICD-10-CM | POA: Diagnosis present

## 2018-10-25 DIAGNOSIS — Z720 Tobacco use: Secondary | ICD-10-CM | POA: Diagnosis present

## 2018-10-25 DIAGNOSIS — Z7982 Long term (current) use of aspirin: Secondary | ICD-10-CM

## 2018-10-25 DIAGNOSIS — G40909 Epilepsy, unspecified, not intractable, without status epilepticus: Secondary | ICD-10-CM | POA: Diagnosis present

## 2018-10-25 DIAGNOSIS — Z4659 Encounter for fitting and adjustment of other gastrointestinal appliance and device: Secondary | ICD-10-CM

## 2018-10-25 DIAGNOSIS — G9341 Metabolic encephalopathy: Secondary | ICD-10-CM | POA: Diagnosis present

## 2018-10-25 DIAGNOSIS — I130 Hypertensive heart and chronic kidney disease with heart failure and stage 1 through stage 4 chronic kidney disease, or unspecified chronic kidney disease: Secondary | ICD-10-CM | POA: Diagnosis not present

## 2018-10-25 DIAGNOSIS — E669 Obesity, unspecified: Secondary | ICD-10-CM

## 2018-10-25 DIAGNOSIS — R4182 Altered mental status, unspecified: Secondary | ICD-10-CM | POA: Diagnosis not present

## 2018-10-25 DIAGNOSIS — E1169 Type 2 diabetes mellitus with other specified complication: Secondary | ICD-10-CM

## 2018-10-25 DIAGNOSIS — IMO0001 Reserved for inherently not codable concepts without codable children: Secondary | ICD-10-CM

## 2018-10-25 DIAGNOSIS — I2510 Atherosclerotic heart disease of native coronary artery without angina pectoris: Secondary | ICD-10-CM

## 2018-10-25 DIAGNOSIS — J811 Chronic pulmonary edema: Secondary | ICD-10-CM

## 2018-10-25 DIAGNOSIS — I5033 Acute on chronic diastolic (congestive) heart failure: Secondary | ICD-10-CM | POA: Diagnosis present

## 2018-10-25 DIAGNOSIS — I959 Hypotension, unspecified: Secondary | ICD-10-CM | POA: Diagnosis present

## 2018-10-25 DIAGNOSIS — N179 Acute kidney failure, unspecified: Secondary | ICD-10-CM

## 2018-10-25 DIAGNOSIS — I361 Nonrheumatic tricuspid (valve) insufficiency: Secondary | ICD-10-CM | POA: Diagnosis not present

## 2018-10-25 DIAGNOSIS — I272 Pulmonary hypertension, unspecified: Secondary | ICD-10-CM | POA: Diagnosis present

## 2018-10-25 DIAGNOSIS — Z8249 Family history of ischemic heart disease and other diseases of the circulatory system: Secondary | ICD-10-CM

## 2018-10-25 DIAGNOSIS — J9621 Acute and chronic respiratory failure with hypoxia: Secondary | ICD-10-CM | POA: Diagnosis not present

## 2018-10-25 DIAGNOSIS — G9340 Encephalopathy, unspecified: Secondary | ICD-10-CM | POA: Diagnosis present

## 2018-10-25 DIAGNOSIS — I509 Heart failure, unspecified: Secondary | ICD-10-CM | POA: Diagnosis not present

## 2018-10-25 DIAGNOSIS — Z800 Family history of malignant neoplasm of digestive organs: Secondary | ICD-10-CM

## 2018-10-25 DIAGNOSIS — Z9861 Coronary angioplasty status: Secondary | ICD-10-CM

## 2018-10-25 DIAGNOSIS — I5023 Acute on chronic systolic (congestive) heart failure: Secondary | ICD-10-CM | POA: Diagnosis not present

## 2018-10-25 DIAGNOSIS — E662 Morbid (severe) obesity with alveolar hypoventilation: Secondary | ICD-10-CM | POA: Diagnosis present

## 2018-10-25 DIAGNOSIS — Z9981 Dependence on supplemental oxygen: Secondary | ICD-10-CM

## 2018-10-25 DIAGNOSIS — E538 Deficiency of other specified B group vitamins: Secondary | ICD-10-CM | POA: Diagnosis present

## 2018-10-25 DIAGNOSIS — I255 Ischemic cardiomyopathy: Secondary | ICD-10-CM | POA: Diagnosis present

## 2018-10-25 DIAGNOSIS — I340 Nonrheumatic mitral (valve) insufficiency: Secondary | ICD-10-CM | POA: Diagnosis not present

## 2018-10-25 DIAGNOSIS — Z955 Presence of coronary angioplasty implant and graft: Secondary | ICD-10-CM

## 2018-10-25 DIAGNOSIS — I5043 Acute on chronic combined systolic (congestive) and diastolic (congestive) heart failure: Secondary | ICD-10-CM | POA: Diagnosis present

## 2018-10-25 DIAGNOSIS — Z825 Family history of asthma and other chronic lower respiratory diseases: Secondary | ICD-10-CM

## 2018-10-25 DIAGNOSIS — Z8673 Personal history of transient ischemic attack (TIA), and cerebral infarction without residual deficits: Secondary | ICD-10-CM

## 2018-10-25 DIAGNOSIS — I252 Old myocardial infarction: Secondary | ICD-10-CM

## 2018-10-25 DIAGNOSIS — E1165 Type 2 diabetes mellitus with hyperglycemia: Secondary | ICD-10-CM | POA: Diagnosis present

## 2018-10-25 DIAGNOSIS — Z79899 Other long term (current) drug therapy: Secondary | ICD-10-CM

## 2018-10-25 DIAGNOSIS — Z6841 Body Mass Index (BMI) 40.0 and over, adult: Secondary | ICD-10-CM | POA: Diagnosis not present

## 2018-10-25 DIAGNOSIS — I10 Essential (primary) hypertension: Secondary | ICD-10-CM

## 2018-10-25 DIAGNOSIS — Z1159 Encounter for screening for other viral diseases: Secondary | ICD-10-CM

## 2018-10-25 DIAGNOSIS — J9601 Acute respiratory failure with hypoxia: Secondary | ICD-10-CM | POA: Diagnosis not present

## 2018-10-25 DIAGNOSIS — D509 Iron deficiency anemia, unspecified: Secondary | ICD-10-CM | POA: Diagnosis present

## 2018-10-25 DIAGNOSIS — Z803 Family history of malignant neoplasm of breast: Secondary | ICD-10-CM

## 2018-10-25 DIAGNOSIS — R569 Unspecified convulsions: Secondary | ICD-10-CM | POA: Diagnosis not present

## 2018-10-25 DIAGNOSIS — Z794 Long term (current) use of insulin: Secondary | ICD-10-CM

## 2018-10-25 DIAGNOSIS — E872 Acidosis: Secondary | ICD-10-CM | POA: Diagnosis present

## 2018-10-25 DIAGNOSIS — M7989 Other specified soft tissue disorders: Secondary | ICD-10-CM | POA: Diagnosis present

## 2018-10-25 DIAGNOSIS — K922 Gastrointestinal hemorrhage, unspecified: Secondary | ICD-10-CM | POA: Diagnosis present

## 2018-10-25 DIAGNOSIS — K5901 Slow transit constipation: Secondary | ICD-10-CM | POA: Diagnosis present

## 2018-10-25 DIAGNOSIS — E876 Hypokalemia: Secondary | ICD-10-CM | POA: Diagnosis present

## 2018-10-25 DIAGNOSIS — G4733 Obstructive sleep apnea (adult) (pediatric): Secondary | ICD-10-CM | POA: Diagnosis present

## 2018-10-25 DIAGNOSIS — E785 Hyperlipidemia, unspecified: Secondary | ICD-10-CM | POA: Diagnosis present

## 2018-10-25 DIAGNOSIS — Z86711 Personal history of pulmonary embolism: Secondary | ICD-10-CM

## 2018-10-25 DIAGNOSIS — K219 Gastro-esophageal reflux disease without esophagitis: Secondary | ICD-10-CM | POA: Diagnosis present

## 2018-10-25 DIAGNOSIS — Z833 Family history of diabetes mellitus: Secondary | ICD-10-CM

## 2018-10-25 DIAGNOSIS — R29898 Other symptoms and signs involving the musculoskeletal system: Secondary | ICD-10-CM | POA: Diagnosis not present

## 2018-10-25 DIAGNOSIS — Z978 Presence of other specified devices: Secondary | ICD-10-CM

## 2018-10-25 LAB — BASIC METABOLIC PANEL
Anion gap: 10 (ref 5–15)
BUN: 50 mg/dL — ABNORMAL HIGH (ref 6–20)
CO2: 22 mmol/L (ref 22–32)
Calcium: 8.8 mg/dL — ABNORMAL LOW (ref 8.9–10.3)
Chloride: 108 mmol/L (ref 98–111)
Creatinine, Ser: 1.87 mg/dL — ABNORMAL HIGH (ref 0.44–1.00)
GFR calc Af Amer: 34 mL/min — ABNORMAL LOW (ref >60)
GFR calc non Af Amer: 29 mL/min — ABNORMAL LOW (ref >60)
Glucose, Bld: 180 mg/dL — ABNORMAL HIGH (ref 70–99)
Potassium: 4.6 mmol/L (ref 3.5–5.1)
Sodium: 140 mmol/L (ref 135–145)

## 2018-10-25 LAB — CBC WITH DIFFERENTIAL/PLATELET
Abs Immature Granulocytes: 0.01 10*3/uL (ref 0.00–0.07)
Abs Immature Granulocytes: 0.03 10*3/uL (ref 0.00–0.07)
Basophils Absolute: 0 10*3/uL (ref 0.0–0.1)
Basophils Absolute: 0 10*3/uL (ref 0.0–0.1)
Basophils Relative: 1 %
Basophils Relative: 1 %
Eosinophils Absolute: 0.1 10*3/uL (ref 0.0–0.5)
Eosinophils Absolute: 0.1 10*3/uL (ref 0.0–0.5)
Eosinophils Relative: 2 %
Eosinophils Relative: 1 %
HCT: 33.7 % — ABNORMAL LOW (ref 36.0–46.0)
HCT: 38.1 % (ref 36.0–46.0)
Hemoglobin: 10.2 g/dL — ABNORMAL LOW (ref 12.0–15.0)
Hemoglobin: 11.3 g/dL — ABNORMAL LOW (ref 12.0–15.0)
Immature Granulocytes: 0 %
Immature Granulocytes: 1 %
Lymphocytes Relative: 27 %
Lymphocytes Relative: 24 %
Lymphs Abs: 1.5 10*3/uL (ref 0.7–4.0)
Lymphs Abs: 1.2 10*3/uL (ref 0.7–4.0)
MCH: 26.6 pg (ref 26.0–34.0)
MCH: 26.1 pg (ref 26.0–34.0)
MCHC: 30.3 g/dL (ref 30.0–36.0)
MCHC: 29.7 g/dL — ABNORMAL LOW (ref 30.0–36.0)
MCV: 88 fL (ref 80.0–100.0)
MCV: 88 fL (ref 80.0–100.0)
Monocytes Absolute: 0.9 10*3/uL (ref 0.1–1.0)
Monocytes Absolute: 0.4 10*3/uL (ref 0.1–1.0)
Monocytes Relative: 16 %
Monocytes Relative: 8 %
Neutro Abs: 3.1 10*3/uL (ref 1.7–7.7)
Neutro Abs: 3.3 10*3/uL (ref 1.7–7.7)
Neutrophils Relative %: 54 %
Neutrophils Relative %: 65 %
Platelets: 228 10*3/uL (ref 150–400)
Platelets: 197 10*3/uL (ref 150–400)
RBC: 3.83 MIL/uL — ABNORMAL LOW (ref 3.87–5.11)
RBC: 4.33 MIL/uL (ref 3.87–5.11)
RDW: 17.5 % — ABNORMAL HIGH (ref 11.5–15.5)
RDW: 17.6 % — ABNORMAL HIGH (ref 11.5–15.5)
WBC: 5.7 10*3/uL (ref 4.0–10.5)
WBC: 5.1 10*3/uL (ref 4.0–10.5)
nRBC: 0 % (ref 0.0–0.2)
nRBC: 0 % (ref 0.0–0.2)

## 2018-10-25 LAB — SARS CORONAVIRUS 2 BY RT PCR (HOSPITAL ORDER, PERFORMED IN ~~LOC~~ HOSPITAL LAB): SARS Coronavirus 2: NEGATIVE

## 2018-10-25 LAB — BLOOD GAS, ARTERIAL
Acid-Base Excess: 0.9 mmol/L (ref 0.0–2.0)
Bicarbonate: 27.2 mmol/L (ref 20.0–28.0)
Drawn by: 56007
O2 Content: 4 L/min
O2 Saturation: 97 %
Patient temperature: 98.6
pCO2 arterial: 61.8 mmHg — ABNORMAL HIGH (ref 32.0–48.0)
pH, Arterial: 7.265 — ABNORMAL LOW (ref 7.350–7.450)
pO2, Arterial: 109 mmHg — ABNORMAL HIGH (ref 83.0–108.0)

## 2018-10-25 LAB — COMPREHENSIVE METABOLIC PANEL
ALT: 16 U/L (ref 0–44)
AST: 28 U/L (ref 15–41)
Albumin: 3.1 g/dL — ABNORMAL LOW (ref 3.5–5.0)
Alkaline Phosphatase: 140 U/L — ABNORMAL HIGH (ref 38–126)
Anion gap: 13 (ref 5–15)
BUN: 48 mg/dL — ABNORMAL HIGH (ref 6–20)
CO2: 21 mmol/L — ABNORMAL LOW (ref 22–32)
Calcium: 9.2 mg/dL (ref 8.9–10.3)
Chloride: 107 mmol/L (ref 98–111)
Creatinine, Ser: 1.8 mg/dL — ABNORMAL HIGH (ref 0.44–1.00)
GFR calc Af Amer: 35 mL/min — ABNORMAL LOW (ref >60)
GFR calc non Af Amer: 30 mL/min — ABNORMAL LOW (ref >60)
Glucose, Bld: 173 mg/dL — ABNORMAL HIGH (ref 70–99)
Potassium: 5.4 mmol/L — ABNORMAL HIGH (ref 3.5–5.1)
Sodium: 141 mmol/L (ref 135–145)
Total Bilirubin: 1.2 mg/dL (ref 0.3–1.2)
Total Protein: 9.1 g/dL — ABNORMAL HIGH (ref 6.5–8.1)

## 2018-10-25 LAB — MAGNESIUM: Magnesium: 2.6 mg/dL — ABNORMAL HIGH (ref 1.7–2.4)

## 2018-10-25 LAB — GLUCOSE, CAPILLARY
Glucose-Capillary: 162 mg/dL — ABNORMAL HIGH (ref 70–99)
Glucose-Capillary: 213 mg/dL — ABNORMAL HIGH (ref 70–99)
Glucose-Capillary: 231 mg/dL — ABNORMAL HIGH (ref 70–99)
Glucose-Capillary: 209 mg/dL — ABNORMAL HIGH (ref 70–99)

## 2018-10-25 LAB — TROPONIN I
Troponin I: <0.03 ng/mL (ref <0.03)
Troponin I: <0.03 ng/mL (ref <0.03)

## 2018-10-25 LAB — BRAIN NATRIURETIC PEPTIDE: B Natriuretic Peptide: 709.4 pg/mL — ABNORMAL HIGH (ref 0.0–100.0)

## 2018-10-25 MED ORDER — INSULIN ASPART 100 UNIT/ML ~~LOC~~ SOLN
0.0000 [IU] | Freq: Three times a day (TID) | SUBCUTANEOUS | Status: DC
  Administered 2018-10-25: 2 [IU] via SUBCUTANEOUS
  Administered 2018-10-25 (×2): 3 [IU] via SUBCUTANEOUS
  Administered 2018-10-26 (×3): 2 [IU] via SUBCUTANEOUS
  Administered 2018-10-27 – 2018-10-28 (×3): 1 [IU] via SUBCUTANEOUS

## 2018-10-25 MED ORDER — CARVEDILOL 3.125 MG PO TABS
3.1250 mg | ORAL_TABLET | Freq: Two times a day (BID) | ORAL | Status: DC
  Administered 2018-10-25 – 2018-10-27 (×6): 3.125 mg via ORAL
  Filled 2018-10-25 (×7): qty 1

## 2018-10-25 MED ORDER — IPRATROPIUM BROMIDE 0.02 % IN SOLN: 0.5000 mg | RESPIRATORY_TRACT | Status: DC

## 2018-10-25 MED ORDER — POTASSIUM CHLORIDE CRYS ER 20 MEQ PO TBCR
20.0000 meq | EXTENDED_RELEASE_TABLET | ORAL | Status: DC
  Administered 2018-10-25: 20 meq via ORAL
  Filled 2018-10-25: qty 1

## 2018-10-25 MED ORDER — INSULIN DETEMIR 100 UNIT/ML FLEXPEN: 18.0000 [IU] | PEN_INJECTOR | Freq: Every day | SUBCUTANEOUS | Status: DC

## 2018-10-25 MED ORDER — FUROSEMIDE 10 MG/ML IJ SOLN
80.0000 mg | Freq: Once | INTRAMUSCULAR | Status: AC
  Administered 2018-10-25: 80 mg via INTRAVENOUS
  Filled 2018-10-25: qty 8

## 2018-10-25 MED ORDER — ALBUTEROL SULFATE (2.5 MG/3ML) 0.083% IN NEBU
2.5000 mg | INHALATION_SOLUTION | RESPIRATORY_TRACT | Status: DC | PRN
  Administered 2018-10-28 – 2018-11-01 (×3): 2.5 mg via RESPIRATORY_TRACT
  Filled 2018-10-25 (×3): qty 3

## 2018-10-25 MED ORDER — NITROGLYCERIN 0.4 MG SL SUBL: 0.4000 mg | SUBLINGUAL_TABLET | SUBLINGUAL | Status: DC | PRN

## 2018-10-25 MED ORDER — IPRATROPIUM-ALBUTEROL 0.5-2.5 (3) MG/3ML IN SOLN
RESPIRATORY_TRACT | Status: AC
  Administered 2018-10-25: 3 mL via RESPIRATORY_TRACT
  Filled 2018-10-25: qty 3

## 2018-10-25 MED ORDER — IPRATROPIUM-ALBUTEROL 0.5-2.5 (3) MG/3ML IN SOLN
3.0000 mL | Freq: Three times a day (TID) | RESPIRATORY_TRACT | Status: DC
  Administered 2018-10-25 – 2018-11-07 (×38): 3 mL via RESPIRATORY_TRACT
  Filled 2018-10-25 (×40): qty 3

## 2018-10-25 MED ORDER — ROSUVASTATIN CALCIUM 20 MG PO TABS
40.0000 mg | ORAL_TABLET | Freq: Every day | ORAL | Status: DC
  Administered 2018-10-25 – 2018-10-28 (×4): 40 mg via ORAL
  Filled 2018-10-25 (×4): qty 2

## 2018-10-25 MED ORDER — SENNOSIDES-DOCUSATE SODIUM 8.6-50 MG PO TABS
1.0000 | ORAL_TABLET | Freq: Two times a day (BID) | ORAL | Status: DC
  Administered 2018-10-25 – 2018-10-27 (×5): 1 via ORAL
  Filled 2018-10-25 (×5): qty 1

## 2018-10-25 MED ORDER — LACTULOSE 10 GM/15ML PO SOLN
20.0000 g | Freq: Three times a day (TID) | ORAL | Status: AC
  Administered 2018-10-25 (×2): 20 g via ORAL
  Filled 2018-10-25 (×2): qty 30

## 2018-10-25 MED ORDER — POLYETHYLENE GLYCOL 3350 17 G PO PACK
17.0000 g | PACK | ORAL | Status: DC
  Administered 2018-10-25 – 2018-10-27 (×3): 17 g via ORAL
  Filled 2018-10-25 (×3): qty 1

## 2018-10-25 MED ORDER — ACETAMINOPHEN 325 MG PO TABS: 650.0000 mg | ORAL_TABLET | Freq: Four times a day (QID) | ORAL | Status: DC | PRN

## 2018-10-25 MED ORDER — PANTOPRAZOLE SODIUM 40 MG PO TBEC
40.0000 mg | DELAYED_RELEASE_TABLET | Freq: Every day | ORAL | Status: DC
  Administered 2018-10-25 – 2018-10-27 (×3): 40 mg via ORAL
  Filled 2018-10-25 (×3): qty 1

## 2018-10-25 MED ORDER — ALBUTEROL SULFATE (2.5 MG/3ML) 0.083% IN NEBU: 2.5000 mg | INHALATION_SOLUTION | RESPIRATORY_TRACT | Status: DC

## 2018-10-25 MED ORDER — ACETAMINOPHEN 650 MG RE SUPP: 650.0000 mg | Freq: Four times a day (QID) | RECTAL | Status: DC | PRN

## 2018-10-25 MED ORDER — METOLAZONE 5 MG PO TABS
5.0000 mg | ORAL_TABLET | Freq: Once | ORAL | Status: AC
  Administered 2018-10-25: 5 mg via ORAL
  Filled 2018-10-25: qty 1

## 2018-10-25 MED ORDER — ASPIRIN EC 81 MG PO TBEC
81.0000 mg | DELAYED_RELEASE_TABLET | Freq: Every day | ORAL | Status: DC
  Administered 2018-10-25 – 2018-10-27 (×3): 81 mg via ORAL
  Filled 2018-10-25 (×3): qty 1

## 2018-10-25 MED ORDER — AEROCHAMBER PLUS FLO-VU LARGE MISC
1.0000 | Freq: Once | Status: AC
  Administered 2018-10-25: 1

## 2018-10-25 MED ORDER — MOMETASONE FURO-FORMOTEROL FUM 100-5 MCG/ACT IN AERO
2.0000 | INHALATION_SPRAY | Freq: Two times a day (BID) | RESPIRATORY_TRACT | Status: DC
  Administered 2018-10-25 – 2018-10-27 (×5): 2 via RESPIRATORY_TRACT
  Filled 2018-10-25: qty 8.8

## 2018-10-25 MED ORDER — ENOXAPARIN SODIUM 80 MG/0.8ML ~~LOC~~ SOLN
80.0000 mg | Freq: Every day | SUBCUTANEOUS | Status: DC
  Administered 2018-10-25 – 2018-10-30 (×6): 80 mg via SUBCUTANEOUS
  Filled 2018-10-25 (×6): qty 0.8

## 2018-10-25 MED ORDER — FERROUS GLUCONATE 324 (38 FE) MG PO TABS
324.0000 mg | ORAL_TABLET | Freq: Every day | ORAL | Status: DC
  Administered 2018-10-25 – 2018-10-26 (×2): 324 mg via ORAL
  Filled 2018-10-25 (×3): qty 1

## 2018-10-25 MED ORDER — INSULIN DETEMIR 100 UNIT/ML ~~LOC~~ SOLN
18.0000 [IU] | Freq: Every day | SUBCUTANEOUS | Status: DC
  Administered 2018-10-25 – 2018-10-26 (×2): 18 [IU] via SUBCUTANEOUS
  Filled 2018-10-25 (×5): qty 0.18

## 2018-10-25 MED ORDER — PREDNISONE 20 MG PO TABS
60.0000 mg | ORAL_TABLET | Freq: Once | ORAL | Status: AC
  Administered 2018-10-25: 60 mg via ORAL
  Filled 2018-10-25: qty 3

## 2018-10-25 MED ORDER — FUROSEMIDE 10 MG/ML IJ SOLN
60.0000 mg | Freq: Three times a day (TID) | INTRAMUSCULAR | Status: DC
  Administered 2018-10-25 – 2018-10-27 (×7): 60 mg via INTRAVENOUS
  Filled 2018-10-25 (×7): qty 6

## 2018-10-25 MED ORDER — ALBUTEROL SULFATE HFA 108 (90 BASE) MCG/ACT IN AERS
2.0000 | INHALATION_SPRAY | Freq: Once | RESPIRATORY_TRACT | Status: AC
  Administered 2018-10-25: 2 via RESPIRATORY_TRACT
  Filled 2018-10-25: qty 6.7

## 2018-10-25 NOTE — ED Notes (Signed)
ED TO INPATIENT HANDOFF REPORT  ED Nurse Name and Phone #:  Rod Holler [phone]  S Name/Age/Gender Tamara Silva 58 y.o. female Room/Bed: 027C/027C  Code Status   Code Status: Prior  Home/SNF/Other Home Patient oriented to: self, place, time and situation Is this baseline? Yes   Triage Complete: Triage complete  Chief Complaint SOB   Triage Note Pt arrived [facility] for "discomfort when she breaths" and generalized edema. Pt wears 4L [place] at baseline. Also states she has a Productive cough with yellow sputum x 1 week  BP 142/94 P 60 O2 99% CBG 137    Allergies Allergies  Allergen Reactions  . Metolazone Other (See Comments)    Dizziness and falling  . Plavix [Clopidogrel Bisulfate] Other (See Comments)    High PRU's - non-responder  . Ibuprofen Other (See Comments)    Pt states she is not supposed to take ibuprofen because of other meds she is taking  . Nsaids Other (See Comments)    "I do not take NSAIDs d/t interfering with other meds"    Level of Care/Admitting Diagnosis ED Disposition    ED Disposition Condition [place]: [place] [[id number]]  Level of Care: Progressive [102]  Covid Evaluation: Screening Protocol (No Symptoms)  Diagnosis: Acute respiratory failure with hypoxia [facility]) [[id number]]  Admitting Physician: Rise Patience [phone]  Attending Physician: Rise Patience [phone]  Estimated length of stay: past midnight tomorrow  Certification:: I certify this patient will need inpatient services for at least 2 midnights  PT Class (Do Not Modify): Inpatient [101]  PT Acc Code (Do Not Modify): Private [1]       B Medical/Surgery History Past Medical History:  Diagnosis Date  . Asthma   . CAD (coronary artery disease)   . Chronic combined systolic (congestive) and diastolic (congestive) heart failure ([place])   . Chronic iron deficiency anemia   . Diabetes mellitus without complication ([place])   . DOE (dyspnea on  exertion)   . Edema of both legs   . Hepatic steatosis   . Hypertension   . Pulmonary emboli ([place]) 2014   Past Surgical History:  Procedure Laterality Date  . BIOPSY  07/10/2018   Procedure: BIOPSY;  Surgeon: [name], MD;  Location: [name];  Service: Gastroenterology;;  . COLONOSCOPY WITH PROPOFOL N/A 08/03/2013   Procedure: COLONOSCOPY WITH PROPOFOL;  Surgeon: Jeryl Columbia, MD;  Location: [facility];  Service: Endoscopy;  Laterality: N/A;  . COLONOSCOPY WITH PROPOFOL N/A 07/10/2018   Procedure: COLONOSCOPY WITH PROPOFOL;  Surgeon: [name], MD;  Location: [place];  Service: Gastroenterology;  Laterality: N/A;  . CORONARY ANGIOPLASTY WITH STENT PLACEMENT     CAD in 2006 x 2 and 2009 2 more- place din REx in [place] and [place] med  . CORONARY ANGIOPLASTY WITH STENT PLACEMENT  10/07/2013   Xience [place] DES 2.75  mm x 15  mm  . ESOPHAGOGASTRODUODENOSCOPY (EGD) WITH PROPOFOL N/A 08/03/2013   Procedure: ESOPHAGOGASTRODUODENOSCOPY (EGD) WITH PROPOFOL;  Surgeon: Jeryl Columbia, MD;  Location: [facility];  Service: Endoscopy;  Laterality: N/A;  . ESOPHAGOGASTRODUODENOSCOPY (EGD) WITH PROPOFOL N/A 07/10/2018   Procedure: ESOPHAGOGASTRODUODENOSCOPY (EGD) WITH PROPOFOL;  Surgeon: [name], MD;  Location: [place];  Service: Gastroenterology;  Laterality: N/A;  . LEFT HEART CATHETERIZATION WITH CORONARY ANGIOGRAM N/A 10/07/2013   Procedure: LEFT HEART CATHETERIZATION WITH CORONARY ANGIOGRAM;  Surgeon: Leonie Man, MD;  Location[facility] C[facility] LAB;  Service: Cardiovascular;  Laterality: N/A;  A IV Location/Drains/Wounds Patient Lines/Drains/Airways Status   Active Line/Drains/Airways    Name:   Placement date:   Placement time:   Site:   Days:   Peripheral IV 10/25/18 Right;Anterior Forearm   10/25/18    0315    Forearm   less than 1   External Urinary Catheter   06/25/18    0218    -   122          Intake/Output Last 24 hours No intake or output data in the 24 hours  ending 10/25/18 0603  Labs/Imaging Results for orders placed or performed during the hospital encounter of 10/25/18 (from the past 48 hour(s))  Basic metabolic panel     Status: Abnormal   Collection Time: 10/25/18  3:09 AM  Result Value Ref Range   Sodium 140 135 - 145 mmol/L   Potassium 4.6 3.5 - 5.1 mmol/L   Chloride 108 98 - 111 mmol/L   CO2 22 22 - 32 mmol/L   Glucose, Bld 180 (H) 70 - 99 mg/dL   BUN 50 (H) 6 - 20 mg/dL   Creatinine, Ser 1.87 (H) 0.44 - 1.00 mg/dL   Calcium 8.8 (L) 8.9 - 10.3 mg/dL   GFR calc non Af Amer 29 (L) >60 mL/min   GFR calc Af Amer 34 (L) >60 mL/min   Anion gap 10 5 - 15    Comment: Performed at [facility], [address], [place], [place] [address]  CBC with Differential/Platelet     Status: Abnormal   Collection Time: 10/25/18  3:09 AM  Result Value Ref Range   WBC 5.7 4.0 - 10.5 K/uL   RBC 3.83 (L) 3.87 - 5.11 MIL/uL   Hemoglobin 10.2 (L) 12.0 - 15.0 g/dL   HCT 33.7 (L) 36.0 - 46.0 %   MCV 88.0 80.0 - 100.0 fL   MCH 26.6 26.0 - 34.0 pg   MCHC 30.3 30.0 - 36.0 g/dL   RDW 17.5 (H) 11.5 - 15.5 %   Platelets 228 150 - 400 K/uL   nRBC 0.0 0.0 - 0.2 %   Neutrophils Relative % 54 %   Neutro Abs 3.1 1.7 - 7.7 K/uL   Lymphocytes Relative 27 %   Lymphs Abs 1.5 0.7 - 4.0 K/uL   Monocytes Relative 16 %   Monocytes Absolute 0.9 0.1 - 1.0 K/uL   Eosinophils Relative 2 %   Eosinophils Absolute 0.1 0.0 - 0.5 K/uL   Basophils Relative 1 %   Basophils Absolute 0.0 0.0 - 0.1 K/uL   Immature Granulocytes 0 %   Abs Immature Granulocytes 0.01 0.00 - 0.07 K/uL    Comment: Performed at [facility], [place] [address] [place], [place] [address]  Brain natriuretic peptide     Status: Abnormal   Collection Time: 10/25/18  3:09 AM  Result Value Ref Range   B Natriuretic Peptide 709.4 (H) 0.0 - 100.0 pg/mL    Comment: Performed at [facility] [address], [place], [place] [address]  SARS Coronavirus 2 (CEPHEID - Performed in [place]  hospital lab), Hosp Order     Status: None   Collection Time: 10/25/18  3:33 AM  Result Value Ref Range   SARS Coronavirus 2 NEGATIVE NEGATIVE    Comment: (NOTE) If result is NEGATIVE SARS-CoV-2 target nucleic acids are NOT DETECTED. The SARS-CoV-2 RNA is generally detectable in upper and lower  respiratory specimens during the acute phase of infection. The lowest  concentration of SARS-CoV-2 viral copies this assay can detect is 250  copies / mL. A negative result does not preclude SARS-CoV-2 infection  and should not be used as the sole basis for treatment or other  patient management decisions.  A negative result may occur with  improper specimen collection / handling, submission of specimen other  than nasopharyngeal swab, presence of viral mutation(s) within the  areas targeted by this assay, and inadequate number of viral copies  (<250 copies / mL). A negative result must be combined with clinical  observations, patient history, and epidemiological information. If result is POSITIVE SARS-CoV-2 target nucleic acids are DETECTED. The SARS-CoV-2 RNA is generally detectable in upper and lower  respiratory specimens dur ing the acute phase of infection.  Positive  results are indicative of active infection with SARS-CoV-2.  Clinical  correlation with patient history and other diagnostic information is  necessary to determine patient infection status.  Positive results do  not rule out bacterial infection or co-infection with other viruses. If result is PRESUMPTIVE POSTIVE SARS-CoV-2 nucleic acids MAY BE PRESENT.   A presumptive positive result was obtained on the submitted specimen  and confirmed on repeat testing.  While 2019 novel coronavirus  (SARS-CoV-2) nucleic acids may be present in the submitted sample  additional confirmatory testing may be necessary for epidemiological  and / or clinical management purposes  to differentiate between  SARS-CoV-2 and other Sarbecovirus  currently known to infect humans.  If clinically indicated additional testing with an alternate test  methodology [phone]) is advised. The SARS-CoV-2 RNA is generally  detectable in upper and lower respiratory sp ecimens during the acute  phase of infection. The expected result is Negative. Fact Sheet for Patients:  StrictlyIdeas.no Fact Sheet for Healthcare Providers: BankingDealers.co.za This test is not yet approved or cleared by the [place] FDA and has been authorized for detection and/or diagnosis of SARS-CoV-2 by FDA under an Emergency Use Authorization (EUA).  This EUA will remain in effect (meaning this test can be used) for the duration of the COVID-19 declaration under Section 564(b)(1) of the Act, 21 U.S.C. section 360bbb-3(b)(1), unless the authorization is terminated or revoked sooner. Performed at [facility], [place] [address], [place], [place] [address]    Dg Chest Port 1 View  Result Date: 10/25/2018 CLINICAL DATA:  Shortness of breath EXAM: PORTABLE CHEST 1 VIEW COMPARISON:  07/08/2018 FINDINGS: Cardiac shadow remains enlarged. Lungs are well aerated bilaterally. Chronic scarring in the left lateral lung is noted. No acute infiltrate or sizable effusion is seen. IMPRESSION: Chronic changes without acute abnormality. Electronically Signed   By: Inez Catalina M.D.   On: 10/25/2018 02:13    Pending Labs Unresulted Labs (From admission, onward)   None      Vitals/Pain Today's Vitals   10/25/18 0430 10/25/18 0433 10/25/18 0500 10/25/18 0530  BP: (!) 116/59  121/73 (!) 121/59  Pulse: (!) 57   (!) 56  Resp:  19 20   Temp:      TempSrc:      SpO2: 100%   100%  PainSc:        Isolation Precautions No active isolations  Medications Medications  albuterol (VENTOLIN HFA) 108 (90 Base) MCG/ACT inhaler 2 puff (2 puffs Inhalation Given 10/25/18 0331)  AeroChamber Plus Flo-Vu Large MISC 1 each (1 each Other  Given 10/25/18 0332)  predniSONE (DELTASONE) tablet 60 mg (60 mg Oral Given 10/25/18 0329)  furosemide (LASIX) injection 80 mg (80 mg Intravenous  Given 10/25/18 0431)    Mobility walks with device Low fall risk   Focused Assessments Pulmonary Assessment Handoff:  Lung sounds: Bilateral Breath Sounds: Diminished L Breath Sounds: Diminished R Breath Sounds: Diminished O2 Device: Nasal Cannula O2 Flow Rate (L/min): 4 L/min      R Recommendations: See Admitting Provider Note  Report given to:   Additional Notes:

## 2018-10-25 NOTE — ED Provider Notes (Signed)
[facility] EMERGENCY DEPARTMENT Provider Note   CSN: [id number] Arrival date & time: 10/25/18  0118    History   Chief Complaint Chief Complaint  Patient presents with  . Generalized Edema  . Shortness of Breath    HPI Tamara Silva is a 58 y.o. female.     The history is provided by the patient.  Shortness of Breath  Severity:  Moderate Onset quality:  Gradual Duration:  1 week Timing:  Constant Progression:  Worsening Chronicity:  New Relieved by:  Nothing Worsened by:  Nothing Associated symptoms: cough and sputum production   Associated symptoms: no chest pain, no fever and no hemoptysis   Patient with history of CAD, COPD, CHF presents with shortness of breath.  She reports with past week she had increasing shortness of breath and edema.  She reports cough.  No fever. She wears 4 L oxygen at all times. She is still smoking. Past Medical History:  Diagnosis Date  . Asthma   . CAD (coronary artery disease)   . Chronic combined systolic (congestive) and diastolic (congestive) heart failure ([place])   . Chronic iron deficiency anemia   . Diabetes mellitus without complication ([place])   . DOE (dyspnea on exertion)   . Edema of both legs   . Hepatic steatosis   . Hypertension   . Pulmonary emboli [facility]) 2014    Patient Active Problem List   Diagnosis Date Noted  . Noncompliance with treatment plan 07/24/2018  . General patient noncompliance 07/24/2018  . Noncompliance with diet and medication regimen 07/24/2018  . Dysarthria   . Morbid obesity with BMI of 60.0-69.9, adult ([place])   . BiPAP (biphasic positive airway pressure) dependence   . Acute renal failure superimposed on stage 3 chronic kidney disease ([place]) 07/08/2018  . Pancytopenia ([place]) 07/08/2018  . Type II diabetes mellitus with renal manifestations ([place]) 07/08/2018  . Acute on chronic combined systolic and diastolic CHF (congestive heart failure) ([place]) 06/26/2018  . Hepatic steatosis  06/22/2018  . Chronic respiratory failure with hypoxia and hypercapnia (HCC)   . Hyperammonemia ([place])   . Acute on chronic diastolic CHF (congestive heart failure) ([place])   . On home O2   . Slow transit constipation   . Encephalopathy 06/09/2018  . Cocaine abuse ([place])   . Diabetes mellitus type 2 in obese ([place])   . Generalized tonic-clonic seizure ([place]) 06/05/2018  . Cerebral embolism with cerebral infarction 06/01/2018  . AKI (acute kidney injury) ([place]) 05/30/2018  . Pressure injury of skin 05/30/2018  . Acute on chronic anemia 05/29/2018  . Chronic respiratory failure with hypoxia ([place]) 01/27/2018  . Iron deficiency anemia 01/27/2018  . Tobacco abuse disorder 10/03/2017  . Dyslipidemia 04/29/2016  . Primary insomnia 04/29/2016  . Hypoxemia 09/08/2015  . Congestive heart failure (CHF) ([place]) 09/08/2015  . Obesity hypoventilation syndrome ([place])   . Microcytic anemia   . Acute encephalopathy   . Uncontrolled type 2 diabetes mellitus with hyperosmolarity without coma ([place])   . Acute on chronic respiratory failure with hypercapnia ([place])   . CAP (community acquired pneumonia)   . OSA (obstructive sleep apnea)   . Acute and chr resp failure, unsp w hypoxia or hypercapnia ([place]) 06/22/2015  . Acute on chronic combined systolic (congestive) and diastolic (congestive) heart failure ([place]) 06/21/2015  . Hypoglycemia 06/21/2015  . Uncontrolled type 2 diabetes mellitus without complication, with long-term current use of insulin ([place])   . Dyslipidemia associated with type 2 diabetes mellitus ([place]) 05/21/2015  .  Hyperkalemia 05/21/2015  . COPD GOLD III  06/10/2014  . Acute respiratory failure with hypoxia ([place]) 03/04/2014  . CKD (chronic kidney disease), stage II 02/07/2014  . COPD exacerbation ([place]) 02/01/2014  . CAD S/P percutaneous coronary angioplasty - prior PCI to LAD; RCA PCI: new Xience Alpine DES 2.75 mm x 15 mm  10/07/2013  . Morbid obesity ([place]) 07/30/2013  . Chronic combined  systolic and diastolic heart failure ([place]) 07/10/2013  . Chronic pulmonary embolism ([place]) 02/08/2013  . Bipolar disorder ([place]) 02/08/2013  . Essential hypertension 02/08/2013  . Chronic anticoagulation 11/27/2012  . Edema extremities 10/15/2012  . COPD (chronic obstructive pulmonary disease) ([place]) 10/15/2012  . Pulmonary embolism on right ([place]) 10/15/2012  . Hypertension 10/15/2012  . Insulin dependent diabetes mellitus ([place]) 10/15/2012    Past Surgical History:  Procedure Laterality Date  . BIOPSY  07/10/2018   Procedure: BIOPSY;  Surgeon: [name], MD;  Location: [facility];  Service: Gastroenterology;;  . COLONOSCOPY WITH PROPOFOL N/A 08/03/2013   Procedure: COLONOSCOPY WITH PROPOFOL;  Surgeon: Jeryl Columbia, MD;  Location: [facility];  Service: Endoscopy;  Laterality: N/A;  . COLONOSCOPY WITH PROPOFOL N/A 07/10/2018   Procedure: COLONOSCOPY WITH PROPOFOL;  Surgeon: [name], MD;  Location: [facility];  Service: Gastroenterology;  Laterality: N/A;  . CORONARY ANGIOPLASTY WITH STENT PLACEMENT     CAD in 2006 x 2 and 2009 2 more- place din REx in [place] [facility]  . CORONARY ANGIOPLASTY WITH STENT PLACEMENT  10/07/2013   Xience Alpine DES 2.75  mm x 15  mm  . ESOPHAGOGASTRODUODENOSCOPY (EGD) WITH PROPOFOL N/A 08/03/2013   Procedure: ESOPHAGOGASTRODUODENOSCOPY (EGD) WITH PROPOFOL;  Surgeon: Jeryl Columbia, MD;  Location: [facility];  Service: Endoscopy;  Laterality: N/A;  . ESOPHAGOGASTRODUODENOSCOPY (EGD) WITH PROPOFOL N/A 07/10/2018   Procedure: ESOPHAGOGASTRODUODENOSCOPY (EGD) WITH PROPOFOL;  Surgeon: [name], MD;  Location: [facility];  Service: Gastroenterology;  Laterality: N/A;  . LEFT HEART CATHETERIZATION WITH CORONARY ANGIOGRAM N/A 10/07/2013   Procedure: LEFT HEART CATHETERIZATION WITH CORONARY ANGIOGRAM;  Surgeon: Leonie Man, MD;  Location[facility] C[facility] LAB;  Service: Cardiovascular;  Laterality: N/A;     OB History   No obstetric history on file.       Home Medications    Prior to Admission medications   Medication Sig Start Date End Date Taking? Authorizing Provider  ACCU-CHEK AVIVA PLUS test strip USE 1 STRIP TO CHECK GLUCOSE 3 TIMES DAILY 08/07/18   Fulp, Cammie, MD  albuterol (PROAIR HFA) 108 (90 Base) MCG/ACT inhaler INHALE 2 PUFFS BY MOUTH 4 TIMES DAILY AS NEEDED 10/04/18   Fulp, Cammie, MD  aspirin 81 MG tablet Take 1 tablet (81 mg total) by mouth daily. 10/09/13   Barrett, Evelene Croon, PA-C  budesonide-formoterol (SYMBICORT) 80-4.5 MCG/ACT inhaler Inhale 2 puffs into the lungs 2 times daily at 12 noon and 4 pm. 10/04/18   Fulp, Cammie, MD  carvedilol (COREG) 3.125 MG tablet Take 1 tablet (3.125 mg total) by mouth 2 (two) times daily with a meal. 10/04/18   Fulp, Cammie, MD  ferrous gluconate (FERGON) 324 MG tablet Take 1 tablet (324 mg total) by mouth daily with breakfast. Office visit needed prior to future refills 10/04/18   Fulp, Cammie, MD  furosemide (LASIX) 40 MG tablet Take 1 tablet (40 mg total) by mouth daily for 30 days. 10/04/18 11/03/18  Fulp, Cammie, MD  hydrocerin (EUCERIN) CREA Apply 1 application topically 2 (two) times daily. 06/22/18   Love, Ivan Anchors,  PA-C  Insulin Detemir (LEVEMIR FLEXTOUCH) 100 UNIT/ML Pen Inject 18 Units into the skin daily. 10/04/18   Fulp, Cammie, MD  ipratropium-albuterol (DUONEB) 0.5-2.5 (3) MG/3ML SOLN Use nebulizer to inhale contents of one vial every 6 hours as needed for shortness of breath, wheezing 10/04/18   Fulp, Cammie, MD  NEEDLE, DISP, 26 G 26G X 1/2" MISC 1 application by Does not apply route daily. 06/22/18   Love, Ivan Anchors, PA-C  nitroGLYCERIN (NITROSTAT) 0.4 MG SL tablet Place 1 tablet (0.4 mg total) under the tongue every 5 (five) minutes as needed for chest pain. 10/04/18   Fulp, Cammie, MD  omeprazole (PRILOSEC) 40 MG capsule Take 1 capsule (40 mg total) by mouth daily. 10/04/18   Fulp, Cammie, MD  polyethylene glycol (MIRALAX) 17 g packet Take 17 g by mouth every Monday, Wednesday, and Friday. 10/04/18    Fulp, Ander Gaster, MD  Potassium Chloride ER 20 MEQ TBCR Take 20 mEq by mouth every Monday, Wednesday, and Friday for 30 days. 10/04/18 11/03/18  Fulp, Cammie, MD  rosuvastatin (CRESTOR) 40 MG tablet Take 1 tablet (40 mg total) by mouth daily at 6 PM. 10/04/18   Fulp, Cammie, MD  senna-docusate (SENOKOT-S) 8.6-50 MG tablet Take 1 tablet by mouth 2 (two) times daily. If needed for constipation 10/04/18   Antony Blackbird, MD    Family History Family History  Problem Relation Age of Onset  . Stroke Mother   . Hypertension Mother   . Colon cancer Father   . Diabetes Father   . Heart attack Father   . Sarcoidosis Other   . Breast cancer Paternal Aunt   . Emphysema Brother   . Lung disease Brother        Unknown type, 3 brothers, one with liver and lung disease  . Asthma Paternal Aunt     Social History Social History   Tobacco Use  . Smoking status: Former Smoker    Packs/day: 0.50    Years: 20.00    Pack years: 10.00    Types: Cigarettes    Last attempt to quit: 04/30/2014    Years since quitting: 4.4  . Smokeless tobacco: Never Used  . Tobacco comment: smoked off and on x 20 years  Substance Use Topics  . Alcohol use: No    Alcohol/week: 0.0 standard drinks  . Drug use: No     Allergies   Metolazone; Plavix [clopidogrel bisulfate]; Ibuprofen; and Nsaids   Review of Systems Review of Systems  Constitutional: Negative for fever.  Respiratory: Positive for cough, sputum production and shortness of breath. Negative for hemoptysis.   Cardiovascular: Negative for chest pain.  All other systems reviewed and are negative.    Physical Exam Updated Vital Signs BP (!) 117/57   Pulse (!) 59   Temp 97.8 F (36.6 C) (Oral)   Resp (!) 24   LMP 07/28/2013 Comment: perimenopausal  SpO2 99%   Physical Exam CONSTITUTIONAL: Chronically ill-appearing HEAD: Normocephalic/atraumatic EYES: EOMI/PERRL ENMT: Wearing nasal cannula NECK: supple no meningeal signs SPINE/BACK:entire spine  nontender CV: S1/S2 noted, no murmurs/rubs/gallops noted LUNGS: Mild tachypnea, wheezing bilaterally ABDOMEN: soft, nontender, obese, edema noted GU:no cva tenderness NEURO: Pt is awake/alert/appropriate, moves all extremitiesx4.  No facial droop.   EXTREMITIES: pulses normal/equal, full ROM, peripheral edema noted SKIN: warm, color normal PSYCH: no abnormalities of mood noted, alert and oriented to situation   ED Treatments / Results  Labs (all labs ordered are listed, but only abnormal results are displayed) Labs Reviewed  BASIC METABOLIC PANEL - Abnormal; Notable for the following components:      Result Value   Glucose, Bld 180 (*)    BUN 50 (*)    Creatinine, Ser 1.87 (*)    Calcium 8.8 (*)    GFR calc non Af Amer 29 (*)    GFR calc Af Amer 34 (*)    All other components within normal limits  CBC WITH DIFFERENTIAL/PLATELET - Abnormal; Notable for the following components:   RBC 3.83 (*)    Hemoglobin 10.2 (*)    HCT 33.7 (*)    RDW 17.5 (*)    All other components within normal limits  BRAIN NATRIURETIC PEPTIDE - Abnormal; Notable for the following components:   B Natriuretic Peptide 709.4 (*)    All other components within normal limits  SARS CORONAVIRUS 2 (HOSPITAL ORDER, Combine LAB)    EKG EKG Interpretation  Date/Time:  Wednesday Oct 25 2018 01:31:53 EDT Ventricular Rate:  59 PR Interval:    QRS Duration: 116 QT Interval:  549 QTC Calculation: 544 R Axis:   101 Text Interpretation:  Sinus rhythm Nonspecific intraventricular conduction delay Anterior infarct, old LOW VOLTAGE Abnormal ekg Confirmed by Ripley Fraise [phone]) on 10/25/2018 1:47:04 AM   Radiology Dg Chest Port 1 View  Result Date: 10/25/2018 CLINICAL DATA:  Shortness of breath EXAM: PORTABLE CHEST 1 VIEW COMPARISON:  07/08/2018 FINDINGS: Cardiac shadow remains enlarged. Lungs are well aerated bilaterally. Chronic scarring in the left lateral lung is noted. No acute  infiltrate or sizable effusion is seen. IMPRESSION: Chronic changes without acute abnormality. Electronically Signed   By: Inez Catalina M.D.   On: 10/25/2018 02:13    Procedures Procedures  Medications Ordered in ED Medications  albuterol (VENTOLIN HFA) 108 (90 Base) MCG/ACT inhaler 2 puff (2 puffs Inhalation Given 10/25/18 0331)  AeroChamber Plus Flo-Vu Large MISC 1 each (1 each Other Given 10/25/18 0332)  predniSONE (DELTASONE) tablet 60 mg (60 mg Oral Given 10/25/18 0329)  furosemide (LASIX) injection 80 mg (80 mg Intravenous Given 10/25/18 0431)     Initial Impression / Assessment and Plan / ED Course  I have reviewed the triage vital signs and the nursing notes.  Pertinent labs & imaging results that were available during my care of the patient were reviewed by me and considered in my medical decision making (see chart for details).        5:11 AM Patient with complex medical history including CHF, COPD and obesity.  She presented with increasing shortness of breath and reported increased edema.  She has worsening renal function.  Clinically she appears to be volume overloaded.  IV Lasix has been ordered. She was also given albuterol prednisone for presumed COPD exacerbation.  Suspect combination of COPD/CHF. Due to increasing shortness of breath, she will be admitted for medical management.  Discussed with Dr. Hal Hope for admission    The patient was counseled on the dangers of tobacco use, and was advised to quit and reluctant to quit.  Reviewed strategies to maximize success, including support of family/friends.  Tamara Silva was evaluated in Emergency Department on 10/25/2018 for the symptoms described in the history of present illness. She was evaluated in the context of the global COVID-19 pandemic, which necessitated consideration that the patient might be at risk for infection with the SARS-CoV-2 virus that causes COVID-19. Institutional protocols and algorithms that  pertain to the evaluation of patients at risk for COVID-19 are in a state of  rapid change based on information released by regulatory bodies including the CDC and federal and state organizations. These policies and algorithms were followed during the patient's care in the ED.  Final Clinical Impressions(s) / ED Diagnoses   Final diagnoses:  COPD exacerbation ([place])  Acute on chronic systolic congestive heart failure ([place])  AKI (acute kidney injury) [facility])    ED Discharge Orders    None       Ripley Fraise, MD 10/25/18 [phone]

## 2018-10-25 NOTE — Progress Notes (Signed)
Pt requested to be placed on BIPAP. Pt placed on documented settings for the night.  Pt tolerating well.

## 2018-10-25 NOTE — H&P (Signed)
History and Physical    Tamara Silva JJO:[id number] DOB: Jul 07, 1960 DOA: 10/25/2018  PCP: Antony Blackbird, MD  Patient coming from: Home.  Chief Complaint: Shortness of breath.  HPI: Tamara Silva is a 58 y.o. female with history of systolic heart failure last EF measured was 40 to 45% in January 2020, obesity hypoventilation uses BiPAP at bedtime, diabetes mellitus, chronic kidney disease, CAD, recent CVA requiring intubation for seizure anemia presents to the ER because of increasing shortness of breath over the last few days with increasing peripheral edema.  Patient states she has been compliant with her medication.  Denies any chest pain fever chills productive cough nausea vomiting.  Has increasing abdominal girth.  ED Course: In the ER chest x-ray does not show anything acute.  EKG shows normal sinus rhythm with nonspecific ST changes low voltage.  On exam patient has significant peripheral edema 3+ pitting on the lower extremity.  Labs revealed BNP of 709 hemoglobin 10.2 creatinine has increased from baseline to 1.87.  Patient was given Lasix 80 mg IV and also was given some nebulizer and prednisone concerning for COPD.  Admitted for acute respiratory failure most likely from CHF.  Review of Systems: As per HPI, rest all negative.   Past Medical History:  Diagnosis Date  . Asthma   . CAD (coronary artery disease)   . Chronic combined systolic (congestive) and diastolic (congestive) heart failure ([place])   . Chronic iron deficiency anemia   . Diabetes mellitus without complication ([place])   . DOE (dyspnea on exertion)   . Edema of both legs   . Hepatic steatosis   . Hypertension   . Pulmonary emboli ([place]) 2014    Past Surgical History:  Procedure Laterality Date  . BIOPSY  07/10/2018   Procedure: BIOPSY;  Surgeon: [name], MD;  Location: [place];  Service: Gastroenterology;;  . COLONOSCOPY WITH PROPOFOL N/A 08/03/2013   Procedure: COLONOSCOPY WITH PROPOFOL;  Surgeon:  Jeryl Columbia, MD;  Location: [facility];  Service: Endoscopy;  Laterality: N/A;  . COLONOSCOPY WITH PROPOFOL N/A 07/10/2018   Procedure: COLONOSCOPY WITH PROPOFOL;  Surgeon: [name], MD;  Location: [place];  Service: Gastroenterology;  Laterality: N/A;  . CORONARY ANGIOPLASTY WITH STENT PLACEMENT     CAD in 2006 x 2 and 2009 2 more- place din REx in [place] and [place] [facility]  . CORONARY ANGIOPLASTY WITH STENT PLACEMENT  10/07/2013   Xience Alpine DES 2.75  mm x 15  mm  . ESOPHAGOGASTRODUODENOSCOPY (EGD) WITH PROPOFOL N/A 08/03/2013   Procedure: ESOPHAGOGASTRODUODENOSCOPY (EGD) WITH PROPOFOL;  Surgeon: Jeryl Columbia, MD;  Location: [facility];  Service: Endoscopy;  Laterality: N/A;  . ESOPHAGOGASTRODUODENOSCOPY (EGD) WITH PROPOFOL N/A 07/10/2018   Procedure: ESOPHAGOGASTRODUODENOSCOPY (EGD) WITH PROPOFOL;  Surgeon: [name], MD;  Location: [facility] [place];  Service: Gastroenterology;  Laterality: N/A;  . LEFT HEART CATHETERIZATION WITH CORONARY ANGIOGRAM N/A 10/07/2013   Procedure: LEFT HEART CATHETERIZATION WITH CORONARY ANGIOGRAM;  Surgeon: Leonie Man, MD;  Location: [facility] CATH LAB;  Service: Cardiovascular;  Laterality: N/A;     reports that she quit smoking about 4 years ago. Her smoking use included cigarettes. She has a 10.00 pack-year smoking history. She has never used smokeless tobacco. She reports that she does not drink alcohol or use drugs.  Allergies  Allergen Reactions  . Metolazone Other (See Comments)    Dizziness and falling  . Plavix [Clopidogrel Bisulfate] Other (See Comments)    High PRU's - non-responder  . Ibuprofen  Other (See Comments)    Pt states she is not supposed to take ibuprofen because of other meds she is taking  . Nsaids Other (See Comments)    "I do not take NSAIDs d/t interfering with other meds"    Family History  Problem Relation Age of Onset  . Stroke Mother   . Hypertension Mother   . Colon cancer Father   . Diabetes Father   . Heart attack  Father   . Sarcoidosis Other   . Breast cancer Paternal Aunt   . Emphysema Brother   . Lung disease Brother        Unknown type, 3 brothers, one with liver and lung disease  . Asthma Paternal Aunt     Prior to Admission medications   Medication Sig Start Date End Date Taking? Authorizing Provider  ACCU-CHEK AVIVA PLUS test strip USE 1 STRIP TO CHECK GLUCOSE 3 TIMES DAILY 08/07/18   Fulp, Cammie, MD  albuterol (PROAIR HFA) 108 (90 Base) MCG/ACT inhaler INHALE 2 PUFFS BY MOUTH 4 TIMES DAILY AS NEEDED 10/04/18   Fulp, Cammie, MD  aspirin 81 MG tablet Take 1 tablet (81 mg total) by mouth daily. 10/09/13   Barrett, Evelene Croon, PA-C  budesonide-formoterol (SYMBICORT) 80-4.5 MCG/ACT inhaler Inhale 2 puffs into the lungs 2 times daily at 12 noon and 4 pm. 10/04/18   Fulp, Cammie, MD  carvedilol (COREG) 3.125 MG tablet Take 1 tablet (3.125 mg total) by mouth 2 (two) times daily with a meal. 10/04/18   Fulp, Cammie, MD  ferrous gluconate (FERGON) 324 MG tablet Take 1 tablet (324 mg total) by mouth daily with breakfast. Office visit needed prior to future refills 10/04/18   Fulp, Cammie, MD  furosemide (LASIX) 40 MG tablet Take 1 tablet (40 mg total) by mouth daily for 30 days. 10/04/18 11/03/18  Fulp, Cammie, MD  hydrocerin (EUCERIN) CREA Apply 1 application topically 2 (two) times daily. 06/22/18   Love, Ivan Anchors, PA-C  Insulin Detemir (LEVEMIR FLEXTOUCH) 100 UNIT/ML Pen Inject 18 Units into the skin daily. 10/04/18   Fulp, Cammie, MD  ipratropium-albuterol (DUONEB) 0.5-2.5 (3) MG/3ML SOLN Use nebulizer to inhale contents of one vial every 6 hours as needed for shortness of breath, wheezing 10/04/18   Fulp, Cammie, MD  NEEDLE, DISP, 26 G 26G X 1/2" MISC 1 application by Does not apply route daily. 06/22/18   Love, Ivan Anchors, PA-C  nitroGLYCERIN (NITROSTAT) 0.4 MG SL tablet Place 1 tablet (0.4 mg total) under the tongue every 5 (five) minutes as needed for chest pain. 10/04/18   Fulp, Cammie, MD  omeprazole (PRILOSEC) 40 MG  capsule Take 1 capsule (40 mg total) by mouth daily. 10/04/18   Fulp, Cammie, MD  polyethylene glycol (MIRALAX) 17 g packet Take 17 g by mouth every Monday, Wednesday, and Friday. 10/04/18   Fulp, Ander Gaster, MD  Potassium Chloride ER 20 MEQ TBCR Take 20 mEq by mouth every Monday, Wednesday, and Friday for 30 days. 10/04/18 11/03/18  Fulp, Cammie, MD  rosuvastatin (CRESTOR) 40 MG tablet Take 1 tablet (40 mg total) by mouth daily at 6 PM. 10/04/18   Fulp, Cammie, MD  senna-docusate (SENOKOT-S) 8.6-50 MG tablet Take 1 tablet by mouth 2 (two) times daily. If needed for constipation 10/04/18   Antony Blackbird, MD    Physical Exam: Vitals:   10/25/18 0500 10/25/18 0530 10/25/18 0545 10/25/18 0600  BP: 121/73 (!) 121/59  125/73  Pulse:  (!) 56 (!) 58   Resp: 20  Temp:      TempSrc:      SpO2:  100% 100%       Constitutional: Moderately built and nourished. Vitals:   10/25/18 0500 10/25/18 0530 10/25/18 0545 10/25/18 0600  BP: 121/73 (!) 121/59  125/73  Pulse:  (!) 56 (!) 58   Resp: 20     Temp:      TempSrc:      SpO2:  100% 100%    Eyes: Anicteric no pallor. ENMT: No discharge from the ears eyes nose and mouth. Neck: JVD not appreciated no mass felt. Respiratory: No rhonchi or crepitations. Cardiovascular: S1-S2 heard. Abdomen: Distended nontender bowel sounds present. Musculoskeletal: Bilateral lower extremity edema present up to the thighs. Skin: No rash. Neurologic: Alert awake oriented to time place and person.  Moves all extremities. Psychiatric: Appears normal.  Normal affect.   Labs on Admission: I have personally reviewed following labs and imaging studies  CBC: Recent Labs  Lab 10/25/18 0309  WBC 5.7  NEUTROABS 3.1  HGB 10.2*  HCT 33.7*  MCV 88.0  PLT 496   Basic Metabolic Panel: Recent Labs  Lab 10/25/18 0309  NA 140  K 4.6  CL 108  CO2 22  GLUCOSE 180*  BUN 50*  CREATININE 1.87*  CALCIUM 8.8*   GFR: CrCl cannot be calculated (Unknown ideal weight.). Liver  Function Tests: No results for input(s): AST, ALT, ALKPHOS, BILITOT, PROT, ALBUMIN in the last 168 hours. No results for input(s): LIPASE, AMYLASE in the last 168 hours. No results for input(s): AMMONIA in the last 168 hours. Coagulation Profile: No results for input(s): INR, PROTIME in the last 168 hours. Cardiac Enzymes: No results for input(s): CKTOTAL, CKMB, CKMBINDEX, TROPONINI in the last 168 hours. BNP (last 3 results) No results for input(s): PROBNP in the last 8760 hours. HbA1C: No results for input(s): HGBA1C in the last 72 hours. CBG: No results for input(s): GLUCAP in the last 168 hours. Lipid Profile: No results for input(s): CHOL, HDL, LDLCALC, TRIG, CHOLHDL, LDLDIRECT in the last 72 hours. Thyroid Function Tests: No results for input(s): TSH, T4TOTAL, FREET4, T3FREE, THYROIDAB in the last 72 hours. Anemia Panel: No results for input(s): VITAMINB12, FOLATE, FERRITIN, TIBC, IRON, RETICCTPCT in the last 72 hours. Urine analysis:    Component Value Date/Time   COLORURINE AMBER (A) 07/09/2018 1318   APPEARANCEUR CLOUDY (A) 07/09/2018 1318   LABSPEC 1.011 07/09/2018 1318   PHURINE 6.0 07/09/2018 1318   GLUCOSEU 50 (A) 07/09/2018 1318   HGBUR LARGE (A) 07/09/2018 1318   BILIRUBINUR NEGATIVE 07/09/2018 1318   KETONESUR NEGATIVE 07/09/2018 1318   PROTEINUR 30 (A) 07/09/2018 1318   UROBILINOGEN 2.0 (H) 02/21/2015 1914   NITRITE NEGATIVE 07/09/2018 1318   LEUKOCYTESUR NEGATIVE 07/09/2018 1318   Sepsis Labs: @LABRCNTIP (procalcitonin:4,lacticidven:4) ) Recent Results (from the past 240 hour(s))  SARS Coronavirus 2 (CEPHEID - Performed in [place] hospital lab), Hosp Order     Status: None   Collection Time: 10/25/18  3:33 AM  Result Value Ref Range Status   SARS Coronavirus 2 NEGATIVE NEGATIVE Final    Comment: (NOTE) If result is NEGATIVE SARS-CoV-2 target nucleic acids are NOT DETECTED. The SARS-CoV-2 RNA is generally detectable in upper and lower  respiratory  specimens during the acute phase of infection. The lowest  concentration of SARS-CoV-2 viral copies this assay can detect is 250  copies / mL. A negative result does not preclude SARS-CoV-2 infection  and should not be used as the sole basis for  treatment or other  patient management decisions.  A negative result may occur with  improper specimen collection / handling, submission of specimen other  than nasopharyngeal swab, presence of viral mutation(s) within the  areas targeted by this assay, and inadequate number of viral copies  (<250 copies / mL). A negative result must be combined with clinical  observations, patient history, and epidemiological information. If result is POSITIVE SARS-CoV-2 target nucleic acids are DETECTED. The SARS-CoV-2 RNA is generally detectable in upper and lower  respiratory specimens dur ing the acute phase of infection.  Positive  results are indicative of active infection with SARS-CoV-2.  Clinical  correlation with patient history and other diagnostic information is  necessary to determine patient infection status.  Positive results do  not rule out bacterial infection or co-infection with other viruses. If result is PRESUMPTIVE POSTIVE SARS-CoV-2 nucleic acids MAY BE PRESENT.   A presumptive positive result was obtained on the submitted specimen  and confirmed on repeat testing.  While 2019 novel coronavirus  (SARS-CoV-2) nucleic acids may be present in the submitted sample  additional confirmatory testing may be necessary for epidemiological  and / or clinical management purposes  to differentiate between  SARS-CoV-2 and other Sarbecovirus currently known to infect humans.  If clinically indicated additional testing with an alternate test  methodology [phone]) is advised. The SARS-CoV-2 RNA is generally  detectable in upper and lower respiratory sp ecimens during the acute  phase of infection. The expected result is Negative. Fact Sheet for  Patients:  StrictlyIdeas.no Fact Sheet for Healthcare Providers: BankingDealers.co.za This test is not yet approved or cleared by the [place] FDA and has been authorized for detection and/or diagnosis of SARS-CoV-2 by FDA under an Emergency Use Authorization (EUA).  This EUA will remain in effect (meaning this test can be used) for the duration of the COVID-19 declaration under Section 564(b)(1) of the Act, 21 U.S.C. section 360bbb-3(b)(1), unless the authorization is terminated or revoked sooner. Performed at [facility], [place] [address] [place], [place] [address]      Radiological Exams on Admission: Dg Chest Port 1 View  Result Date: 10/25/2018 CLINICAL DATA:  Shortness of breath EXAM: PORTABLE CHEST 1 VIEW COMPARISON:  07/08/2018 FINDINGS: Cardiac shadow remains enlarged. Lungs are well aerated bilaterally. Chronic scarring in the left lateral lung is noted. No acute infiltrate or sizable effusion is seen. IMPRESSION: Chronic changes without acute abnormality. Electronically Signed   By: Inez Catalina M.D.   On: 10/25/2018 02:13    EKG: Independently reviewed.  Normal sinus rhythm low voltage.  Assessment/Plan Principal Problem:   Acute respiratory failure with hypoxia (HCC) Active Problems:   Morbid obesity ([place])   CAD S/P percutaneous coronary angioplasty - prior PCI to LAD; RCA PCI: new Xience Alpine DES 2.75 mm x 15 mm    COPD exacerbation ([facility])   CKD (chronic kidney disease), stage II   Uncontrolled type 2 diabetes mellitus without complication, with long-term current use of insulin (HCC)   OSA (obstructive sleep apnea)   Tobacco abuse disorder   Hypertension   Iron deficiency anemia   Diabetes mellitus type 2 in obese (HCC)   Acute on chronic diastolic CHF (congestive heart failure) (HCC)   Slow transit constipation    1. Acute respiratory failure with hypoxia likely from acute on chronic systolic heart  failure last EF measured in January 2020 was 40 to 45% with grade 2 diastolic dysfunction.  Patient has received Lasix 80 mg IV  in the ER I have placed patient on 60 IV every 12.  Closely follow intake output metabolic panel daily weights.  For possible COPD patient was given prednisone and nebulizer treatment.  I have continued the nebulizer treatment if patient continues to wheeze may reconsider starting prednisone.  Drug screen pending.  ABG ordered. 2. Acute on chronic kidney disease stage III creatinine has increased from baseline of 1.3-1.8.  Closely follow metabolic panel check UA.  Avoid nephrotoxic medications. 3. Diabetes mellitus type 2 on long-acting insulin 18 units with sliding scale coverage. 4. Obesity hypoventilation syndrome on BiPAP at bedtime. 5. Chronic anemia requiring transfusion in February 2020. 6. History of CVA and seizures requiring intubation in January 2020.  On aspirin and statins. 7. CAD status post ending on aspirin statins and Coreg.  Cycle cardiac markers. 8. Hypertension on Coreg. 9. Tobacco use advised about quitting.   DVT prophylaxis: Lovenox. Code Status: Full code. Family Communication: Discussed with patient. Disposition Plan: Home. Consults called: None. Admission status: Inpatient.   Rise Patience MD [facility] Pager [phone].  If 7PM-7AM, please contact night-coverage [email or web address] Password [email or web address]  10/25/2018, 6:06 AM

## 2018-10-25 NOTE — ED Notes (Signed)
purewick placed for urine collection

## 2018-10-25 NOTE — ED Notes (Signed)
Attempted IV access x 2 and Louie RN also attempted x 2 IV team consult placed at this time

## 2018-10-25 NOTE — ED Notes (Addendum)
Stuck pt x 2no bloodreturn report to nurse.iv team is gonna put in an iv.

## 2018-10-25 NOTE — ED Notes (Signed)
IV team at bedside 

## 2018-10-25 NOTE — Progress Notes (Signed)
Pt received from ED to 4E01. Vital signs, and CHG complete. Tele applied. CCMD notified. Pt oriented to room and call bell system. Will continue to monitor.

## 2018-10-25 NOTE — ED Triage Notes (Addendum)
Pt arrived [facility] for "discomfort when she breaths" and generalized edema. Pt wears 4L [place] at baseline. Also states she has a Productive cough with yellow sputum x 1 week  BP 142/94 P 60 O2 99% CBG 137

## 2018-10-25 NOTE — Progress Notes (Signed)
PROGRESS NOTE    Tamara Silva  DZH:[id number] DOB: 10/16/1960 DOA: 10/25/2018 PCP: Antony Blackbird, MD   Brief Narrative:  58 year old with a history of systolic CHF ejection fraction 40-45%- January 2020, obesity hypoventilation on BiPAP at bedtime, diabetes mellitus type 2, CKD, CAD, recent CVA requiring intubation for seizure presented for increasing shortness of breath and peripheral edema.  Chest x-ray overall unremarkable.  BNP slightly elevated, creatinine elevated from baseline of 1.3 to1.8.  Started on IV diuretics.  Also some component of COPD exacerbation.   Assessment & Plan:   Principal Problem:   Acute respiratory failure with hypoxia (HCC) Active Problems:   Morbid obesity ([place])   CAD S/P percutaneous coronary angioplasty - prior PCI to LAD; RCA PCI: new Xience Alpine DES 2.75 mm x 15 mm    COPD exacerbation (HCC)   CKD (chronic kidney disease), stage II   Uncontrolled type 2 diabetes mellitus without complication, with long-term current use of insulin (HCC)   OSA (obstructive sleep apnea)   Tobacco abuse disorder   Hypertension   Iron deficiency anemia   Diabetes mellitus type 2 in obese (HCC)   Acute on chronic diastolic CHF (congestive heart failure) (HCC)   Slow transit constipation  Acute hypoxic and hypercarbic respiratory distress Acute combined diastolic and systolic congestive heart failure with ejection fraction 41% grade 2 diastolic dysfunction, class III -Continue aggressive IV diuresis with Lasix.  Monitor input and output.  Daily weights. -Bronchodilator treatment, hold off on steroid use. -1 dose of oral Zaroxolyn -Monitor electrolytes -Echo in January 2020-ejection fraction 40-45% with grade 2 diastolic dysfunction  Acute kidney injury on CKD stage III -Baseline creatinine 1.3, on admission it was 1.8.  Constipation -Aggressive bowel regimen, will add lactulose  Diabetes mellitus type 2 -On insulin sliding scale, Levemir 18 units daily.   NovoLog 2 units pre-meals  Obesity hypoventilation syndrome on BiPAP at bedtime -Supportive care.  Advised to lose weight.  Would benefit from outpatient follow-up with bariatric surgery  Anemia of chronic disease -Continue iron supplements.  History of CVA complicated by seizure -On aspirin and statin  History of CAD status post stenting -On aspirin statin and Coreg  Essential hypertension -Continue Coreg  Daily tobacco use -Counseled to quit smoking    DVT prophylaxis: Lovenox Code Status: Full code Family Communication: None at bedside Disposition Plan: On IV Lasix, due to bowel edema poor absorption of oral Lasix or diuretics  Consultants:   None  Procedures:   None  Antimicrobials:   None   Subjective: Patient states she does have exertional shortness of breath with significant amount of abdominal and lower extremity swelling.  Due to abdominal swelling she also feels slightly nauseous  Review of Systems Otherwise negative except as per HPI, including: General: Denies fever, chills, night sweats or unintended weight loss. Resp: Denies hemoptysis Cardiac: Denies chest pain, palpitations, orthopnea, paroxysmal nocturnal dyspnea. GI: Denies  vomiting, diarrhea or constipation GU: Denies dysuria, frequency, hesitancy or incontinence MS: Denies muscle aches, joint pain or swelling Neuro: Denies headache, neurologic deficits (focal weakness, numbness, tingling), abnormal gait Psych: Denies anxiety, depression, SI/HI/AVH Skin: Denies new rashes or lesions ID: Denies sick contacts, exotic exposures, travel  Objective: Vitals:   10/25/18 0545 10/25/18 0600 10/25/18 0630 10/25/18 0656  BP:  125/73 133/63 125/77  Pulse: (!) 58   (!) 58  Resp:    20  Temp:    98 F (36.7 C)  TempSrc:    Oral  SpO2: 100%   98%  Weight:    (!) 157.4 kg  Height:    5' 7.5" (1.715 m)   No intake or output data in the 24 hours ending 10/25/18 0737 Filed Weights   10/25/18  0656  Weight: (!) 157.4 kg    Examination:  General exam: Appears calm and comfortable, 4 L nasal cannula Respiratory system: Diffuse diminished breath sounds Cardiovascular system: S1 & S2 heard, RRR. No JVD, murmurs, rubs, gallops or clicks.  3+ lower extremity pitting edema Gastrointestinal system: Abdomen is distended abdomen is nondistended, soft and nontender. No organomegaly or masses felt. Normal bowel sounds heard. Central nervous system: Alert and oriented. No focal neurological deficits. Extremities: Symmetric 5 x 5 power. Skin: No rashes, lesions or ulcers Psychiatry: Judgement and insight appear normal. Mood & affect appropriate.     Data Reviewed:   CBC: Recent Labs  Lab 10/25/18 0309  WBC 5.7  NEUTROABS 3.1  HGB 10.2*  HCT 33.7*  MCV 88.0  PLT 696   Basic Metabolic Panel: Recent Labs  Lab 10/25/18 0309  NA 140  K 4.6  CL 108  CO2 22  GLUCOSE 180*  BUN 50*  CREATININE 1.87*  CALCIUM 8.8*   GFR: Estimated Creatinine Clearance: 52.1 mL/min (A) (by C-G formula based on SCr of 1.87 mg/dL (H)). Liver Function Tests: No results for input(s): AST, ALT, ALKPHOS, BILITOT, PROT, ALBUMIN in the last 168 hours. No results for input(s): LIPASE, AMYLASE in the last 168 hours. No results for input(s): AMMONIA in the last 168 hours. Coagulation Profile: No results for input(s): INR, PROTIME in the last 168 hours. Cardiac Enzymes: No results for input(s): CKTOTAL, CKMB, CKMBINDEX, TROPONINI in the last 168 hours. BNP (last 3 results) No results for input(s): PROBNP in the last 8760 hours. HbA1C: No results for input(s): HGBA1C in the last 72 hours. CBG: Recent Labs  Lab 10/25/18 0707  GLUCAP 162*   Lipid Profile: No results for input(s): CHOL, HDL, LDLCALC, TRIG, CHOLHDL, LDLDIRECT in the last 72 hours. Thyroid Function Tests: No results for input(s): TSH, T4TOTAL, FREET4, T3FREE, THYROIDAB in the last 72 hours. Anemia Panel: No results for input(s):  VITAMINB12, FOLATE, FERRITIN, TIBC, IRON, RETICCTPCT in the last 72 hours. Sepsis Labs: No results for input(s): PROCALCITON, LATICACIDVEN in the last 168 hours.  Recent Results (from the past 240 hour(s))  SARS Coronavirus 2 (CEPHEID - Performed in [facility] hospital lab), Hosp Order     Status: None   Collection Time: 10/25/18  3:33 AM  Result Value Ref Range Status   SARS Coronavirus 2 NEGATIVE NEGATIVE Final    Comment: (NOTE) If result is NEGATIVE SARS-CoV-2 target nucleic acids are NOT DETECTED. The SARS-CoV-2 RNA is generally detectable in upper and lower  respiratory specimens during the acute phase of infection. The lowest  concentration of SARS-CoV-2 viral copies this assay can detect is 250  copies / mL. A negative result does not preclude SARS-CoV-2 infection  and should not be used as the sole basis for treatment or other  patient management decisions.  A negative result may occur with  improper specimen collection / handling, submission of specimen other  than nasopharyngeal swab, presence of viral mutation(s) within the  areas targeted by this assay, and inadequate number of viral copies  (<250 copies / mL). A negative result must be combined with clinical  observations, patient history, and epidemiological information. If result is POSITIVE SARS-CoV-2 target nucleic acids are DETECTED. The SARS-CoV-2 RNA is generally detectable in upper and lower  respiratory specimens dur ing the acute phase of infection.  Positive  results are indicative of active infection with SARS-CoV-2.  Clinical  correlation with patient history and other diagnostic information is  necessary to determine patient infection status.  Positive results do  not rule out bacterial infection or co-infection with other viruses. If result is PRESUMPTIVE POSTIVE SARS-CoV-2 nucleic acids MAY BE PRESENT.   A presumptive positive result was obtained on the submitted specimen  and confirmed on repeat  testing.  While 2019 novel coronavirus  (SARS-CoV-2) nucleic acids may be present in the submitted sample  additional confirmatory testing may be necessary for epidemiological  and / or clinical management purposes  to differentiate between  SARS-CoV-2 and other Sarbecovirus currently known to infect humans.  If clinically indicated additional testing with an alternate test  methodology [phone]) is advised. The SARS-CoV-2 RNA is generally  detectable in upper and lower respiratory sp ecimens during the acute  phase of infection. The expected result is Negative. Fact Sheet for Patients:  StrictlyIdeas.no Fact Sheet for Healthcare Providers: BankingDealers.co.za This test is not yet approved or cleared by the [place] FDA and has been authorized for detection and/or diagnosis of SARS-CoV-2 by FDA under an Emergency Use Authorization (EUA).  This EUA will remain in effect (meaning this test can be used) for the duration of the COVID-19 declaration under Section 564(b)(1) of the Act, 21 U.S.C. section 360bbb-3(b)(1), unless the authorization is terminated or revoked sooner. Performed at [facility], [place] [address] [place], [place] [address]          Radiology Studies: Dg Chest Port 1 View  Result Date: 10/25/2018 CLINICAL DATA:  Shortness of breath EXAM: PORTABLE CHEST 1 VIEW COMPARISON:  07/08/2018 FINDINGS: Cardiac shadow remains enlarged. Lungs are well aerated bilaterally. Chronic scarring in the left lateral lung is noted. No acute infiltrate or sizable effusion is seen. IMPRESSION: Chronic changes without acute abnormality. Electronically Signed   By: Inez Catalina M.D.   On: 10/25/2018 02:13        Scheduled Meds: . albuterol  2.5 mg Nebulization Q4H  . aspirin EC  81 mg Oral Daily  . carvedilol  3.125 mg Oral BID WC  . enoxaparin (LOVENOX) injection  80 mg Subcutaneous Daily  . ferrous gluconate  324 mg Oral Q  breakfast  . furosemide  60 mg Intravenous Q8H  . insulin aspart  0-9 Units Subcutaneous TID WC  . insulin detemir  18 Units Subcutaneous QHS  . ipratropium  0.5 mg Nebulization Q4H  . mometasone-formoterol  2 puff Inhalation BID  . pantoprazole  40 mg Oral Daily  . polyethylene glycol  17 g Oral Q M,W,F  . potassium chloride SA  20 mEq Oral Q M,W,F  . rosuvastatin  40 mg Oral q1800  . senna-docusate  1 tablet Oral BID   Continuous Infusions:   LOS: 0 days   Time spent= 40 mins    Ankit Arsenio Loader, MD [facility]  If 7PM-7AM, please contact night-coverage [email or web address] 10/25/2018, 7:37 AM

## 2018-10-26 ENCOUNTER — Inpatient Hospital Stay (HOSPITAL_COMMUNITY): Payer: Medicaid Other

## 2018-10-26 ENCOUNTER — Encounter (HOSPITAL_COMMUNITY): Payer: Self-pay | Admitting: Cardiology

## 2018-10-26 DIAGNOSIS — J9601 Acute respiratory failure with hypoxia: Secondary | ICD-10-CM

## 2018-10-26 DIAGNOSIS — I5033 Acute on chronic diastolic (congestive) heart failure: Secondary | ICD-10-CM

## 2018-10-26 DIAGNOSIS — I361 Nonrheumatic tricuspid (valve) insufficiency: Secondary | ICD-10-CM

## 2018-10-26 DIAGNOSIS — I340 Nonrheumatic mitral (valve) insufficiency: Secondary | ICD-10-CM

## 2018-10-26 LAB — BASIC METABOLIC PANEL
Anion gap: 7 (ref 5–15)
BUN: 53 mg/dL — ABNORMAL HIGH (ref 6–20)
CO2: 27 mmol/L (ref 22–32)
Calcium: 9.2 mg/dL (ref 8.9–10.3)
Chloride: 107 mmol/L (ref 98–111)
Creatinine, Ser: 1.93 mg/dL — ABNORMAL HIGH (ref 0.44–1.00)
GFR calc Af Amer: 32 mL/min — ABNORMAL LOW (ref >60)
GFR calc non Af Amer: 28 mL/min — ABNORMAL LOW (ref >60)
Glucose, Bld: 200 mg/dL — ABNORMAL HIGH (ref 70–99)
Potassium: 4.9 mmol/L (ref 3.5–5.1)
Sodium: 141 mmol/L (ref 135–145)

## 2018-10-26 LAB — GLUCOSE, CAPILLARY
Glucose-Capillary: 173 mg/dL — ABNORMAL HIGH (ref 70–99)
Glucose-Capillary: 164 mg/dL — ABNORMAL HIGH (ref 70–99)
Glucose-Capillary: 172 mg/dL — ABNORMAL HIGH (ref 70–99)
Glucose-Capillary: 125 mg/dL — ABNORMAL HIGH (ref 70–99)

## 2018-10-26 LAB — ECHOCARDIOGRAM COMPLETE
Height: 67 in
Weight: 5241.66 oz

## 2018-10-26 LAB — CBC
HCT: 32.2 % — ABNORMAL LOW (ref 36.0–46.0)
Hemoglobin: 9.8 g/dL — ABNORMAL LOW (ref 12.0–15.0)
MCH: 26.3 pg (ref 26.0–34.0)
MCHC: 30.4 g/dL (ref 30.0–36.0)
MCV: 86.3 fL (ref 80.0–100.0)
Platelets: 216 10*3/uL (ref 150–400)
RBC: 3.73 MIL/uL — ABNORMAL LOW (ref 3.87–5.11)
RDW: 17.3 % — ABNORMAL HIGH (ref 11.5–15.5)
WBC: 5.7 10*3/uL (ref 4.0–10.5)
nRBC: 0.4 % — ABNORMAL HIGH (ref 0.0–0.2)

## 2018-10-26 LAB — HEPATIC FUNCTION PANEL
ALT: 16 U/L (ref 0–44)
AST: 21 U/L (ref 15–41)
Albumin: 3.1 g/dL — ABNORMAL LOW (ref 3.5–5.0)
Alkaline Phosphatase: 124 U/L (ref 38–126)
Bilirubin, Direct: 0.2 mg/dL (ref 0.0–0.2)
Indirect Bilirubin: 0.5 mg/dL (ref 0.3–0.9)
Total Bilirubin: 0.7 mg/dL (ref 0.3–1.2)
Total Protein: 9 g/dL — ABNORMAL HIGH (ref 6.5–8.1)

## 2018-10-26 LAB — LIPID PANEL
Cholesterol: 159 mg/dL (ref 0–200)
HDL: 40 mg/dL — ABNORMAL LOW (ref >40)
LDL Cholesterol: 104 mg/dL — ABNORMAL HIGH (ref 0–99)
Total CHOL/HDL Ratio: 4 RATIO
Triglycerides: 75 mg/dL (ref <150)
VLDL: 15 mg/dL (ref 0–40)

## 2018-10-26 LAB — MAGNESIUM: Magnesium: 2.6 mg/dL — ABNORMAL HIGH (ref 1.7–2.4)

## 2018-10-26 LAB — TROPONIN I: Troponin I: <0.03 ng/mL (ref <0.03)

## 2018-10-26 MED ORDER — PERFLUTREN LIPID MICROSPHERE
1.0000 mL | INTRAVENOUS | Status: AC | PRN
  Administered 2018-10-26: 2 mL via INTRAVENOUS
  Filled 2018-10-26: qty 10

## 2018-10-26 MED ORDER — ISOSORB DINITRATE-HYDRALAZINE 20-37.5 MG PO TABS
0.5000 | ORAL_TABLET | Freq: Three times a day (TID) | ORAL | Status: DC
  Administered 2018-10-26 – 2018-10-28 (×7): 0.5 via ORAL
  Filled 2018-10-26 (×2): qty 0.5
  Filled 2018-10-26: qty 1
  Filled 2018-10-26: qty 0.5
  Filled 2018-10-26 (×5): qty 1

## 2018-10-26 MED ORDER — METOLAZONE 5 MG PO TABS
2.5000 mg | ORAL_TABLET | Freq: Once | ORAL | Status: AC
  Administered 2018-10-26: 2.5 mg via ORAL
  Filled 2018-10-26: qty 1

## 2018-10-26 NOTE — Progress Notes (Signed)
  Echocardiogram 2D Echocardiogram has been performed.  Takeyah Wieman L Androw 10/26/2018, 2:15 PM

## 2018-10-26 NOTE — Evaluation (Signed)
Occupational Therapy Evaluation Patient Details Name: Tamara Silva MRN: [id number] DOB: 09-14-1960 Today's Date: 10/26/2018    History of Present Illness Pt is a 58 yo female s/p SOB, edema with a neqative CT of chest , 4L O2 dependent. PMHx: recent CVA with seizure requiring intubation, EF of 40-50%, DMT2, chronic kidney dx, CAD, obesity/   Clinical Impression   Pt PTA: living with boyfriend and able to perform UB ADL but requiring assist for LB ADL due to SOB and large body habitus. Pt currently, presenting with decreased mobility, decreased ability to care for self and poor activity tolerance. Pt minA for transfers and only took a few steps to commode; modA for bed mobility. Pt minA for UB ADL and maxA for LB ADL. Pt would benefit from continued OT skilled services for ADL, mobility and safety in SNF setting. OT to follow acutelyf or energy conservation, ADL and transfers.      Follow Up Recommendations  SNF;Supervision - Intermittent    Equipment Recommendations  None recommended by OT    Recommendations for Other Services       Precautions / Restrictions Precautions Precautions: Fall;Other (comment) Precaution Comments: watch O2; frequent urination Restrictions Weight Bearing Restrictions: No      Mobility Bed Mobility Overal bed mobility: Needs Assistance Bed Mobility: Sidelying to Sit;Supine to Sit   Sidelying to sit: Mod assist Supine to sit: Mod assist     General bed mobility comments: for trunk elevation and movement of BLEs  Transfers Overall transfer level: Needs assistance Equipment used: Rolling walker (2 wheeled)(3in1) Transfers: Sit to/from [facility] Sit to Stand: Min assist Stand pivot transfers: Min guard       General transfer comment: sit to stand minA for power up    Balance Overall balance assessment: Needs assistance Sitting-balance support: Single extremity supported Sitting balance-Leahy Scale: Fair     Standing  balance support: Bilateral upper extremity supported Standing balance-Leahy Scale: Poor                             ADL either performed or assessed with clinical judgement   ADL Overall ADL's : Needs assistance/impaired Eating/Feeding: Modified independent;Sitting   Grooming: Set up;Sitting   Upper Body Bathing: Minimal assistance;Sitting   Lower Body Bathing: Maximal assistance;Sitting/lateral leans;Sit to/from stand   Upper Body Dressing : Minimal assistance;Sitting;Standing   Lower Body Dressing: Maximal assistance;Sitting/lateral leans;Sit to/from stand   Toilet Transfer: Minimal assistance;BSC;Stand-pivot   Toileting- Clothing Manipulation and Hygiene: Maximal assistance;Sitting/lateral lean;Sit to/from stand       Functional mobility during ADLs: Minimal assistance;Rolling walker;Cueing for safety;Cueing for sequencing General ADL Comments: Pt limited by decreased activity tolerance, mobility and increased need for assist for ADL. Pt minA for mobility, short distances and RW.      Vision Baseline Vision/History: No visual deficits Vision Assessment?: No apparent visual deficits     Perception     Praxis      Pertinent Vitals/Pain       Hand Dominance Right   Extremity/Trunk Assessment Upper Extremity Assessment Upper Extremity Assessment: Generalized weakness;RUE deficits/detail RUE Deficits / Details: s/p CVA residual weakness   Lower Extremity Assessment Lower Extremity Assessment: Generalized weakness   Cervical / Trunk Assessment Cervical / Trunk Assessment: Other exceptions Cervical / Trunk Exceptions: large body habitus   Communication Communication Communication: No difficulties   Cognition Arousal/Alertness: Awake/alert Behavior During Therapy: WFL for tasks assessed/performed;Flat affect Overall Cognitive Status: Within Functional Limits  for tasks assessed                                     General Comments   Pt performing ADL functional mobility with minA; HR 60 BPM and 96% on RA    Exercises     Shoulder Instructions      Home Living Family/patient expects to be discharged to:: Private residence Living Arrangements: Non-relatives/Friends Available Help at Discharge: Family;Available 24 hours/day Type of Home: House Home Access: Stairs to enter [employer] of Steps: 2 Entrance Stairs-Rails: None Home Layout: One level     Bathroom Shower/Tub: Teacher, early years/pre: Handicapped height Bathroom Accessibility: Yes   Home Equipment: Cane - single point;Bedside commode;Walker - 2 wheels          Prior Functioning/Environment Level of Independence: Needs assistance  Gait / Transfers Assistance Needed: Ambulates short distances/household distances with RW and w/c on the porch and in the community ADL's / Homemaking Assistance Needed: modified independent with UB ADL and requiring assist for LB ADL   Comments: Slip in May but was able to sit on commode quickly without falling        OT Problem List: Decreased strength;Decreased activity tolerance;Impaired balance (sitting and/or standing);Decreased safety awareness      OT Treatment/Interventions: Self-care/ADL training;Therapeutic exercise;Neuromuscular education;Energy conservation;Therapeutic activities;Patient/family education;Balance training    OT Goals(Current goals can be found in the care plan section) Acute Rehab OT Goals Patient Stated Goal: to go home OT Goal Formulation: With patient Time For Goal Achievement: 11/09/18 Potential to Achieve Goals: Good ADL Goals Pt Will Perform Grooming: with modified independence;sitting Pt Will Perform Lower Body Dressing: sit to/from stand;with min assist;sitting/lateral leans Pt Will Perform Toileting - Clothing Manipulation and hygiene: with min assist;sitting/lateral leans;sit to/from stand Additional ADL Goal #1: Pt will state 3 energy conservation  techniques for ADL and mobility. Additional ADL Goal #2: Pt will perform ADL tasks with minA overall and fair balance.  OT Frequency: Min 3X/week   Barriers to D/C:            Co-evaluation              AM-PAC OT "6 Clicks" Daily Activity     Outcome Measure Help from another person eating meals?: None Help from another person taking care of personal grooming?: None Help from another person toileting, which includes using toliet, bedpan, or urinal?: A Lot Help from another person bathing (including washing, rinsing, drying)?: A Lot Help from another person to put on and taking off regular upper body clothing?: A Little Help from another person to put on and taking off regular lower body clothing?: A Lot 6 Click Score: 17   End of Session Equipment Utilized During Treatment: Gait belt;Rolling walker Nurse Communication: Mobility status  Activity Tolerance: Patient tolerated treatment well Patient left: Other (comment)(on 3in1 with needs in reach- RN and NT alerted)  OT Visit Diagnosis: Unsteadiness on feet (R26.81);Muscle weakness (generalized) (M62.81)                Time: 0920-1006 OT Time Calculation (min): 46 min Charges:  OT General Charges $OT Visit: 1 Visit OT Evaluation $OT Eval Moderate Complexity: 1 Mod OT Treatments $Self Care/Home Management : 8-22 mins $Therapeutic Activity: 8-22 mins  Ebony Hail Harold Hedge) Marsa Aris OTR/L Acute Rehabilitation Services Pager: [phone] Office: [place] 10/26/2018, 11:21 AM

## 2018-10-26 NOTE — Progress Notes (Signed)
Orthopedic Tech Progress Note Patient Details:  Tamara Silva August 31, 1960 [id number]  Ortho Devices Type of Ortho Device: Haematologist Ortho Device/Splint Location: bilateral Ortho Device/Splint Interventions: Adjustment, Application, Ordered   Post Interventions Patient Tolerated: Well Instructions Provided: Care of device, Adjustment of device   Janit Pagan 10/26/2018, 12:46 PM

## 2018-10-26 NOTE — Progress Notes (Signed)
Pt requests to be placed on bipap. Head strap adjusted. Pt states she is comfortable. Jerald Kief, RN

## 2018-10-26 NOTE — Progress Notes (Signed)
Inpatient Diabetes Program Recommendations  AACE/ADA: New Consensus Statement on Inpatient Glycemic Control  Target Ranges:  Prepandial:   less than 140 mg/dL      Peak postprandial:   less than 180 mg/dL (1-2 hours)      Critically ill patients:  140 - 180 mg/dL   Results for LENDA, BARATTA (MRN [id number]) as of 10/26/2018 10:28  Ref. Range 10/25/2018 07:07 10/25/2018 11:26 10/25/2018 16:06 10/25/2018 21:41 10/26/2018 06:09  Glucose-Capillary Latest Ref Range: 70 - 99 mg/dL 162 (H) 213 (H) 231 (H) 209 (H) 173 (H)   Review of Glycemic Control  Diabetes history: DM2 Outpatient Diabetes medications: Levemir 18 units daily Current orders for Inpatient glycemic control: Levemir 18 units QHS, Novolog 0-9 units TID with meals  Inpatient Diabetes Program Recommendations:   Correction (SSI): Please consider ordering Novolog 0-5 units QHS for bedtime correction.  Insulin - Meal Coverage: Please consider ordering Novolog 3 units TID with meals for meal coverage if patient eats at least 50% of meals.  Thanks, Barnie Alderman, RN, MSN, CDE Diabetes Coordinator Inpatient Diabetes Program [phone] (Team Pager from 8am to 5pm)

## 2018-10-26 NOTE — Progress Notes (Signed)
PROGRESS NOTE    Tamara Silva  OHY:[id number] DOB: Sep 19, 1960 DOA: 10/25/2018 PCP: Antony Blackbird, MD   Brief Narrative:  58 year old with a history of systolic CHF ejection fraction 40-45%- January 2020, obesity hypoventilation on BiPAP at bedtime, diabetes mellitus type 2, CKD, CAD, recent CVA requiring intubation for seizure presented for increasing shortness of breath and peripheral edema.  Chest x-ray overall unremarkable.  BNP slightly elevated, creatinine elevated from baseline of 1.3 to1.8.  Started on IV diuretics.  Also some component of COPD exacerbation. Cardiology consulted give her adv cardiac issues.    Assessment & Plan:   Principal Problem:   Acute respiratory failure with hypoxia (HCC) Active Problems:   Morbid obesity ([place])   CAD S/P percutaneous coronary angioplasty - prior PCI to LAD; RCA PCI: new Xience Alpine DES 2.75 mm x 15 mm    COPD exacerbation (HCC)   CKD (chronic kidney disease), stage II   Uncontrolled type 2 diabetes mellitus without complication, with long-term current use of insulin (HCC)   OSA (obstructive sleep apnea)   Tobacco abuse disorder   Hypertension   Iron deficiency anemia   Diabetes mellitus type 2 in obese (HCC)   Acute on chronic diastolic CHF (congestive heart failure) (HCC)   Slow transit constipation  Acute hypoxic and hypercarbic respiratory distress Acute combined diastolic and systolic congestive heart failure with ejection fraction 94% grade 2 diastolic dysfunction, class III -Continue aggressive IV diuresis with Lasix.  Monitor input and output.  Daily weights. -Bronchodilator treatment, hold off on steroid use. -Another dose of Zaroxolyn today. Cardiology consulted for medical management. BiDil added.  -Monitor electrolytes -Echo in January 2020-ejection fraction 40-45% with grade 2 diastolic dysfunction  Acute kidney injury on CKD stage III; suspect Cardiorenal.  -Baseline creatinine 1.3, on admission it was 1.8. Monitor  closely with aggressive diuresis.   Constipation -Aggressive bowel regimen  Diabetes mellitus type 2 -On insulin sliding scale, Levemir 18 units daily.  NovoLog 2 units pre-meals  Obesity hypoventilation syndrome on BiPAP at bedtime -Supportive care.  Advised to lose weight.  Would benefit from outpatient follow-up with bariatric surgery. On BiPAP at nighttime.   Anemia of chronic disease -Continue iron supplements. Iron studies ordered.   History of CVA complicated by seizure -On aspirin and statin  History of CAD status post stenting -On aspirin statin and Coreg  Essential hypertension -Continue Coreg  Daily tobacco use -Counseled to quit smoking  DVT prophylaxis: Lovenox Code Status: Full code Family Communication: None at bedside Disposition Plan: On IV Lasix, due to bowel edema poor absorption of oral Lasix or diuretics  Consultants:   None  Procedures:   None  Antimicrobials:   None   Subjective: Still has significant amount of abdominal and LE swelling. Still has exertional sob.   Review of Systems Otherwise negative except as per HPI, including: General: Denies fever, chills, night sweats or unintended weight loss. Resp: Denies hemoptysis Cardiac: Denies chest pain, palpitations, orthopnea, paroxysmal nocturnal dyspnea. GI: Denies  vomiting, diarrhea or constipation GU: Denies dysuria, frequency, hesitancy or incontinence MS: Denies muscle aches, joint pain or swelling Neuro: Denies headache, neurologic deficits (focal weakness, numbness, tingling), abnormal gait Psych: Denies anxiety, depression, SI/HI/AVH Skin: Denies new rashes or lesions ID: Denies sick contacts, exotic exposures, travel  Objective: Vitals:   10/26/18 0450 10/26/18 0500 10/26/18 0842 10/26/18 0852  BP: 122/65  111/66 111/66  Pulse: (!) 54 (!) 50 (!) 49 (!) 49  Resp: (!) 24 18 (!) 23   Temp: [name] Kitchen)  97.1 F (36.2 C)  (!) 96.3 F (35.7 C)   TempSrc: Axillary  Axillary    SpO2: 96% 100% 98%   Weight:  (!) 148.6 kg    Height:        Intake/Output Summary (Last 24 hours) at 10/26/2018 1222 Last data filed at 10/26/2018 7341 Gross per 24 hour  Intake 60 ml  Output 600 ml  Net -540 ml   Filed Weights   10/25/18 0656 10/25/18 0800 10/26/18 0500  Weight: (!) 157.4 kg (!) 158.2 kg (!) 148.6 kg    Examination:  General exam: Appears calm and comfortable, 4 L nasal cannula Respiratory system: Diffuse diminished breath sounds Cardiovascular system: S1 & S2 heard, RRR. No JVD, murmurs, rubs, gallops or clicks.  3+ lower extremity pitting edema Gastrointestinal system: Abdomen is distended abdomen is nondistended, soft and nontender. No organomegaly or masses felt. Normal bowel sounds heard. Central nervous system: Alert and oriented. No focal neurological deficits. Extremities: Symmetric 5 x 5 power. Skin: No rashes, lesions or ulcers Psychiatry: Judgement and insight appear normal. Mood & affect appropriate.     Data Reviewed:   CBC: Recent Labs  Lab 10/25/18 0309 10/25/18 0724 10/26/18 0013  WBC 5.7 5.1 5.7  NEUTROABS 3.1 3.3  --   HGB 10.2* 11.3* 9.8*  HCT 33.7* 38.1 32.2*  MCV 88.0 88.0 86.3  PLT 228 197 937   Basic Metabolic Panel: Recent Labs  Lab 10/25/18 0309 10/25/18 0928 10/26/18 0013  NA 140 141 141  K 4.6 5.4* 4.9  CL 108 107 107  CO2 22 21* 27  GLUCOSE 180* 173* 200*  BUN 50* 48* 53*  CREATININE 1.87* 1.80* 1.93*  CALCIUM 8.8* 9.2 9.2  MG  --  2.6* 2.6*   GFR: Estimated Creatinine Clearance: 48.4 mL/min (A) (by C-G formula based on SCr of 1.93 mg/dL (H)). Liver Function Tests: Recent Labs  Lab 10/25/18 0928  AST 28  ALT 16  ALKPHOS 140*  BILITOT 1.2  PROT 9.1*  ALBUMIN 3.1*   No results for input(s): LIPASE, AMYLASE in the last 168 hours. No results for input(s): AMMONIA in the last 168 hours. Coagulation Profile: No results for input(s): INR, PROTIME in the last 168 hours. Cardiac Enzymes: Recent Labs   Lab 10/25/18 0928 10/25/18 1553 10/26/18 0013  TROPONINI <0.03 <0.03 <0.03   BNP (last 3 results) No results for input(s): PROBNP in the last 8760 hours. HbA1C: No results for input(s): HGBA1C in the last 72 hours. CBG: Recent Labs  Lab 10/25/18 1126 10/25/18 1606 10/25/18 2141 10/26/18 0609 10/26/18 1128  GLUCAP 213* 231* 209* 173* 164*   Lipid Profile: No results for input(s): CHOL, HDL, LDLCALC, TRIG, CHOLHDL, LDLDIRECT in the last 72 hours. Thyroid Function Tests: No results for input(s): TSH, T4TOTAL, FREET4, T3FREE, THYROIDAB in the last 72 hours. Anemia Panel: No results for input(s): VITAMINB12, FOLATE, FERRITIN, TIBC, IRON, RETICCTPCT in the last 72 hours. Sepsis Labs: No results for input(s): PROCALCITON, LATICACIDVEN in the last 168 hours.  Recent Results (from the past 240 hour(s))  SARS Coronavirus 2 (CEPHEID - Performed in [place] hospital lab), Hosp Order     Status: None   Collection Time: 10/25/18  3:33 AM  Result Value Ref Range Status   SARS Coronavirus 2 NEGATIVE NEGATIVE Final    Comment: (NOTE) If result is NEGATIVE SARS-CoV-2 target nucleic acids are NOT DETECTED. The SARS-CoV-2 RNA is generally detectable in upper and lower  respiratory specimens during the acute phase of infection.  The lowest  concentration of SARS-CoV-2 viral copies this assay can detect is 250  copies / mL. A negative result does not preclude SARS-CoV-2 infection  and should not be used as the sole basis for treatment or other  patient management decisions.  A negative result may occur with  improper specimen collection / handling, submission of specimen other  than nasopharyngeal swab, presence of viral mutation(s) within the  areas targeted by this assay, and inadequate number of viral copies  (<250 copies / mL). A negative result must be combined with clinical  observations, patient history, and epidemiological information. If result is POSITIVE SARS-CoV-2 target  nucleic acids are DETECTED. The SARS-CoV-2 RNA is generally detectable in upper and lower  respiratory specimens dur ing the acute phase of infection.  Positive  results are indicative of active infection with SARS-CoV-2.  Clinical  correlation with patient history and other diagnostic information is  necessary to determine patient infection status.  Positive results do  not rule out bacterial infection or co-infection with other viruses. If result is PRESUMPTIVE POSTIVE SARS-CoV-2 nucleic acids MAY BE PRESENT.   A presumptive positive result was obtained on the submitted specimen  and confirmed on repeat testing.  While 2019 novel coronavirus  (SARS-CoV-2) nucleic acids may be present in the submitted sample  additional confirmatory testing may be necessary for epidemiological  and / or clinical management purposes  to differentiate between  SARS-CoV-2 and other Sarbecovirus currently known to infect humans.  If clinically indicated additional testing with an alternate test  methodology [phone]) is advised. The SARS-CoV-2 RNA is generally  detectable in upper and lower respiratory sp ecimens during the acute  phase of infection. The expected result is Negative. Fact Sheet for Patients:  StrictlyIdeas.no Fact Sheet for Healthcare Providers: BankingDealers.co.za This test is not yet approved or cleared by the [place] FDA and has been authorized for detection and/or diagnosis of SARS-CoV-2 by FDA under an Emergency Use Authorization (EUA).  This EUA will remain in effect (meaning this test can be used) for the duration of the COVID-19 declaration under Section 564(b)(1) of the Act, 21 U.S.C. section 360bbb-3(b)(1), unless the authorization is terminated or revoked sooner. Performed at [name], [place] [address] [place], [place] [address]          Radiology Studies: Dg Chest Port 1 View  Result Date: 10/25/2018  CLINICAL DATA:  Shortness of breath EXAM: PORTABLE CHEST 1 VIEW COMPARISON:  07/08/2018 FINDINGS: Cardiac shadow remains enlarged. Lungs are well aerated bilaterally. Chronic scarring in the left lateral lung is noted. No acute infiltrate or sizable effusion is seen. IMPRESSION: Chronic changes without acute abnormality. Electronically Signed   By: Inez Catalina M.D.   On: 10/25/2018 02:13        Scheduled Meds: . aspirin EC  81 mg Oral Daily  . carvedilol  3.125 mg Oral BID WC  . enoxaparin (LOVENOX) injection  80 mg Subcutaneous Daily  . ferrous gluconate  324 mg Oral Q breakfast  . furosemide  60 mg Intravenous Q8H  . insulin aspart  0-9 Units Subcutaneous TID WC  . insulin detemir  18 Units Subcutaneous QHS  . ipratropium-albuterol  3 mL Nebulization TID  . isosorbide-hydrALAZINE  0.5 tablet Oral TID  . metolazone  2.5 mg Oral Once  . mometasone-formoterol  2 puff Inhalation BID  . pantoprazole  40 mg Oral Daily  . polyethylene glycol  17 g Oral Q M,W,F  . rosuvastatin  40 mg  Oral q1800  . senna-docusate  1 tablet Oral BID   Continuous Infusions:   LOS: 1 day   Time spent= 40 mins    Ankit Arsenio Loader, MD [facility]  If 7PM-7AM, please contact night-coverage [email or web address] 10/26/2018, 12:22 PM

## 2018-10-26 NOTE — TOC Initial Note (Signed)
Transition of Care [facility]) - Initial/Assessment Note    Patient Details  Name: Tamara Silva MRN: [id number] Date of Birth: 1960/07/27  [facility] CM/SW Contact:    Carles Collet, RN Phone Number: 10/26/2018, 11:14 AM  Clinical Narrative:               Spoke w patient over the phone. She states she has oxygen and BiPAP through [facility]. She has coverage through [facility], is seen at [facility] by Tacey Ruiz and is followed by the [facility]  transitions of care clinic coordinator Eden Lathe. She follows with Dr Gwenlyn Found for cardiology, and Dr Aundra Dubin for HF, she is linked to the para medicine program at the HF clinic.  She was recently followed by [facility] for [facility] services and would benefit from having a [facility] RN follow her after discharge. [facility] is able to accept back at [facility] and will follow.      Expected Discharge Plan: [place] Barriers to Discharge: Continued Medical Work up   Patient Goals and CMS Choice Patient states their goals for this hospitalization and ongoing recovery are:: to return home CMS Medicare.gov Compare Post Acute Care list provided to:: Patient Choice offered to / list presented to : Patient  Expected Discharge Plan and Services Expected Discharge Plan: [place]   Discharge Planning Services: CM Consult Post Acute Care Choice: Home Health                             HH Arranged: RN [facility] Agency: [facility] Date [place]: 10/26/18 Time [place]: 1114 Representative spoke with at [facility]: [place] Arrangements/Services     Patient language and need for interpreter reviewed:: Yes Do you feel safe going back to the place where you live?: Yes            Criminal Activity/Legal Involvement Pertinent to Current Situation/Hospitalization: No - Comment as needed  Activities of Daily Living Home Assistive Devices/Equipment: Wheelchair, Environmental consultant (specify  type) ADL Screening (condition at time of admission) Patient's cognitive ability adequate to safely complete daily activities?: No Is the patient deaf or have difficulty hearing?: No Does the patient have difficulty seeing, even when wearing glasses/contacts?: No Does the patient have difficulty concentrating, remembering, or making decisions?: Yes Patient able to express need for assistance with ADLs?: Yes Does the patient have difficulty dressing or bathing?: No Independently performs ADLs?: Yes (appropriate for developmental age) Does the patient have difficulty walking or climbing stairs?: Yes Weakness of Legs: Both Weakness of Arms/Hands: None  Permission Sought/Granted                  Emotional Assessment              Admission diagnosis:  COPD exacerbation ([place]) [J44.1] Acute on chronic systolic congestive heart failure ([place]) [I50.23] AKI (acute kidney injury) ([place]) [N17.9] Acute respiratory failure with hypoxia ([place]) [J96.01] Patient Active Problem List   Diagnosis Date Noted  . Noncompliance with treatment plan 07/24/2018  . General patient noncompliance 07/24/2018  . Noncompliance with diet and medication regimen 07/24/2018  . Dysarthria   . Morbid obesity with BMI of 60.0-69.9, adult ([place])   . BiPAP (biphasic positive airway pressure) dependence   . Acute renal failure superimposed on stage 3 chronic kidney disease ([place]) 07/08/2018  . Pancytopenia ([place]) 07/08/2018  . Type II diabetes  mellitus with renal manifestations ([place]) 07/08/2018  . Acute on chronic combined systolic and diastolic CHF (congestive heart failure) ([place]) 06/26/2018  . Hepatic steatosis 06/22/2018  . Chronic respiratory failure with hypoxia and hypercapnia (HCC)   . Hyperammonemia ([place])   . Acute on chronic diastolic CHF (congestive heart failure) ([place])   . On home O2   . Slow transit constipation   . Encephalopathy 06/09/2018  . Cocaine abuse ([place])   . Diabetes mellitus type 2 in  obese ([place])   . Generalized tonic-clonic seizure ([place]) 06/05/2018  . Cerebral embolism with cerebral infarction 06/01/2018  . AKI (acute kidney injury) ([place]) 05/30/2018  . Pressure injury of skin 05/30/2018  . Acute on chronic anemia 05/29/2018  . Chronic respiratory failure with hypoxia ([place]) 01/27/2018  . Iron deficiency anemia 01/27/2018  . Tobacco abuse disorder 10/03/2017  . Dyslipidemia 04/29/2016  . Primary insomnia 04/29/2016  . Hypoxemia 09/08/2015  . Congestive heart failure (CHF) ([place]) 09/08/2015  . Obesity hypoventilation syndrome ([place])   . Microcytic anemia   . Acute encephalopathy   . Uncontrolled type 2 diabetes mellitus with hyperosmolarity without coma ([place])   . Acute on chronic respiratory failure with hypercapnia ([place])   . CAP (community acquired pneumonia)   . OSA (obstructive sleep apnea)   . Acute and chr resp failure, unsp w hypoxia or hypercapnia ([place]) 06/22/2015  . Acute on chronic combined systolic (congestive) and diastolic (congestive) heart failure ([place]) 06/21/2015  . Hypoglycemia 06/21/2015  . Uncontrolled type 2 diabetes mellitus without complication, with long-term current use of insulin ([place])   . Dyslipidemia associated with type 2 diabetes mellitus ([place]) 05/21/2015  . Hyperkalemia 05/21/2015  . COPD GOLD III  06/10/2014  . Acute respiratory failure with hypoxia ([place]) 03/04/2014  . CKD (chronic kidney disease), stage II 02/07/2014  . COPD exacerbation ([place]) 02/01/2014  . CAD S/P percutaneous coronary angioplasty - prior PCI to LAD; RCA PCI: new Xience Alpine DES 2.75 mm x 15 mm  10/07/2013  . Morbid obesity ([place]) 07/30/2013  . Chronic combined systolic and diastolic heart failure ([place]) 07/10/2013  . Chronic pulmonary embolism ([place]) 02/08/2013  . Bipolar disorder ([place]) 02/08/2013  . Essential hypertension 02/08/2013  . Chronic anticoagulation 11/27/2012  . Edema extremities 10/15/2012  . COPD (chronic obstructive pulmonary disease) ([place])  10/15/2012  . Pulmonary embolism on right ([place]) 10/15/2012  . Hypertension 10/15/2012  . Insulin dependent diabetes mellitus ([place]) 10/15/2012   PCP:  Antony Blackbird, MD Pharmacy:   [facility] [place], [place] - [address] AT [place] [address] Phone: [phone] Fax: [place], [place] - [address] [place] [address] Phone: [phone] Fax: [phone]     Social Determinants of Health (SDOH) Interventions    Readmission Risk Interventions Readmission Risk Prevention Plan 01/17/2018  Transportation Screening Complete  HRI or Home Care Consult Complete  Some recent data might be hidden

## 2018-10-26 NOTE — Consult Note (Addendum)
Cardiology Consultation:   Patient ID: Tamara Silva MRN: [id number]; DOB: February 02, 1961  Admit date: 10/25/2018 Date of Consult: 10/26/2018  Primary Care Provider: Antony Blackbird, MD Primary Cardiologist:Dr. Gwenlyn Silva  Advanced Heart Failure:Dr. Aundra Silva Primary Electrophysiologist:  None    Patient Profile:   Tamara Silva is a 58 y.o. 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 Reesa Silva.  History of Present Illness:   Tamara Silva with; hx of CHF, HTN, DM-2, HLD, CVA 1/20 and CAD beginning in 2006, 2009 and in 2015 STEMI with Xience DES to distal RCA, at that time LAD stent with mild in-stent restenosis and EF was 30%.  She has had freq admits for anemia and combined systolic and diastolic HF with transfusions and diuresis. Her Prasugrel was stopped. No source of bleeding Silva on EGD and colonoscopy.   She is on home 02 and nocturnal BiPAP.   Also with obesity hypoventilation syndrome and she did stop tobacco in Jan this year.  Also hx of chronic iron def anemia.  Hx of PE in 2014.    Hx of brachycardia with coreg at higher dose.  Last echo 05/31/18 with EF 40-45%, G2DD, PA pk pressure 71 mmHg.  Carotid dopplers 06/01/18 with 1-39% bil stenosis.   Now admitted 10/25/18 with SOB and edema, + productive cough.  This started over a few weeks.  In ER she had 4+ pitting edema.  No chest pain. No fevers, or chills.  She stated edema started back with COVID.  She denies any increase of salt.  Denies any rapid HR.  She was given IV lasix in ER and nebulizers and steroids for COPD. + acute respiratory failure.    She is neg 1640 since admit.  Wt down from 158.2 Kg  148.6 Kg. ? Change in scales  Since 80 lasix in ER has rec'd  60 mg X 4 ordered for 60 every 8 hours.[name] Kitchen    BNP 709  Troponin <0.03 X 3  Na 141, K+ 4.9 Cr 1.93 up from 1.87 on admit, Mg+ 2.6,  Baseline Cr 1.32  Hgb 9.8, WBC 5.7  plts 216 COVID negative   EKG:  The EKG was personally reviewed and demonstrates:  Sr nonspefic intraventricular conduction delay.  No change from 08/16/18.  Telemetry:  Telemetry was personally reviewed and demonstrates:  SB from 44 to 37s.  Today sitting up on [facility] with no chest pain.  Breathing stable currently.  Significant edema to her waist.  She is feeling better today.  Speech is slow since stroke.     Past Medical History:  Diagnosis Date  . Asthma   . CAD (coronary artery disease)   . Chronic combined systolic (congestive) and diastolic (congestive) heart failure ([place])   . Chronic iron deficiency anemia   . CVA (cerebral vascular accident) ([place]) 05/2018  . Diabetes mellitus without complication ([place])   . DOE (dyspnea on exertion)   . Edema of both legs   . Hepatic steatosis   . Hypertension   . Pulmonary emboli ([place]) 2014  . Respiratory failure ([place]) 09/2018  . Seizure ([place]) 05/2018    Past Surgical History:  Procedure Laterality Date  . BIOPSY  07/10/2018   Procedure: BIOPSY;  Surgeon: Tamara Juniper, MD;  Location: [place];  Service: Gastroenterology;;  . COLONOSCOPY WITH PROPOFOL N/A  08/03/2013   Procedure: COLONOSCOPY WITH PROPOFOL;  Surgeon: Tamara Columbia, MD;  Location: [facility];  Service: Endoscopy;  Laterality: N/A;  . COLONOSCOPY WITH PROPOFOL N/A 07/10/2018   Procedure: COLONOSCOPY WITH PROPOFOL;  Surgeon: Tamara Juniper, MD;  Location: [place];  Service: Gastroenterology;  Laterality: N/A;  . CORONARY ANGIOPLASTY WITH STENT PLACEMENT     CAD in 2006 x 2 and 2009 2 more- place din REx in [place] and [place] med  . CORONARY ANGIOPLASTY WITH STENT PLACEMENT  10/07/2013   Xience Alpine DES 2.75  mm x 15  mm  . ESOPHAGOGASTRODUODENOSCOPY (EGD) WITH PROPOFOL N/A 08/03/2013   Procedure: ESOPHAGOGASTRODUODENOSCOPY (EGD) WITH PROPOFOL;  Surgeon: Tamara Columbia, MD;  [facility]: [facility];  Service: Endoscopy;  Laterality: N/A;  .  ESOPHAGOGASTRODUODENOSCOPY (EGD) WITH PROPOFOL N/A 07/10/2018   Procedure: ESOPHAGOGASTRODUODENOSCOPY (EGD) WITH PROPOFOL;  Surgeon: Tamara Juniper, MD;  Location: [place];  Service: Gastroenterology;  Laterality: N/A;  . LEFT HEART CATHETERIZATION WITH CORONARY ANGIOGRAM N/A 10/07/2013   Procedure: LEFT HEART CATHETERIZATION WITH CORONARY ANGIOGRAM;  Surgeon: Tamara Man, MD;  Location: [facility] CATH LAB;  Service: Cardiovascular;  Laterality: N/A;     Home Medications:  Prior to Admission medications   Medication Sig Start Date End Date Taking? Authorizing Provider  ACCU-CHEK AVIVA PLUS test strip USE 1 STRIP TO CHECK GLUCOSE 3 TIMES DAILY 08/07/18   Silva, Cammie, MD  albuterol (PROAIR HFA) 108 (90 Base) MCG/ACT inhaler INHALE 2 PUFFS BY MOUTH 4 TIMES DAILY AS NEEDED 10/04/18   Silva, Cammie, MD  aspirin 81 MG tablet Take 1 tablet (81 mg total) by mouth daily. 10/09/13   Tamara Silva, Tamara Croon, PA-C  budesonide-formoterol (SYMBICORT) 80-4.5 MCG/ACT inhaler Inhale 2 puffs into the lungs 2 times daily at 12 noon and 4 pm. 10/04/18   Silva, Cammie, MD  carvedilol (COREG) 3.125 MG tablet Take 1 tablet (3.125 mg total) by mouth 2 (two) times daily with a meal. 10/04/18   Silva, Cammie, MD  ferrous gluconate (FERGON) 324 MG tablet Take 1 tablet (324 mg total) by mouth daily with breakfast. Office visit needed prior to future refills 10/04/18   Silva, Cammie, MD  furosemide (LASIX) 40 MG tablet Take 1 tablet (40 mg total) by mouth daily for 30 days. 10/04/18 11/03/18  Silva, Cammie, MD  hydrocerin (EUCERIN) CREA Apply 1 application topically 2 (two) times daily. 06/22/18   Tamara Silva, Tamara Anchors, PA-C  Insulin Detemir (LEVEMIR FLEXTOUCH) 100 UNIT/ML Pen Inject 18 Units into the skin daily. 10/04/18   Silva, Cammie, MD  ipratropium-albuterol (DUONEB) 0.5-2.5 (3) MG/3ML SOLN Use nebulizer to inhale contents of one vial every 6 hours as needed for shortness of breath, wheezing 10/04/18   Silva, Cammie, MD  NEEDLE, DISP, 26 G 26G X 1/2" MISC 1  application by Does not apply route daily. 06/22/18   Tamara Silva, Tamara Anchors, PA-C  nitroGLYCERIN (NITROSTAT) 0.4 MG SL tablet Place 1 tablet (0.4 mg total) under the tongue every 5 (five) minutes as needed for chest pain. 10/04/18   Silva, Cammie, MD  omeprazole (PRILOSEC) 40 MG capsule Take 1 capsule (40 mg total) by mouth daily. 10/04/18   Silva, Cammie, MD  polyethylene glycol (MIRALAX) 17 g packet Take 17 g by mouth every Monday, Wednesday, and Friday. 10/04/18   Silva, Ander Gaster, MD  Potassium Chloride ER 20 MEQ TBCR Take 20 mEq by mouth every Monday, Wednesday, and Friday for 30 days. 10/04/18 11/03/18  Silva, Cammie, MD  rosuvastatin (CRESTOR) 40 MG tablet Take  1 tablet (40 mg total) by mouth daily at 6 PM. 10/04/18   Silva, Cammie, MD  senna-docusate (SENOKOT-S) 8.6-50 MG tablet Take 1 tablet by mouth 2 (two) times daily. If needed for constipation 10/04/18   Silva, Cammie, MD    Inpatient Medications: Scheduled Meds: . aspirin EC  81 mg Oral Daily  . carvedilol  3.125 mg Oral BID WC  . enoxaparin (LOVENOX) injection  80 mg Subcutaneous Daily  . ferrous gluconate  324 mg Oral Q breakfast  . furosemide  60 mg Intravenous Q8H  . insulin aspart  0-9 Units Subcutaneous TID WC  . insulin detemir  18 Units Subcutaneous QHS  . ipratropium-albuterol  3 mL Nebulization TID  . mometasone-formoterol  2 puff Inhalation BID  . pantoprazole  40 mg Oral Daily  . polyethylene glycol  17 g Oral Q M,W,F  . rosuvastatin  40 mg Oral q1800  . senna-docusate  1 tablet Oral BID   Continuous Infusions:  PRN Meds: acetaminophen **OR** acetaminophen, albuterol, nitroGLYCERIN  Allergies:    Allergies  Allergen Reactions  . Metolazone Other (See Comments)    Dizziness and falling  . Plavix [Clopidogrel Bisulfate] Other (See Comments)    High PRU's - non-responder  . Ibuprofen Other (See Comments)    Pt states she is not supposed to take ibuprofen because of other meds she is taking  . Nsaids Other (See Comments)    "I do not  take NSAIDs d/t interfering with other meds"    Social History:   Social History   Socioeconomic History  . Marital status: Single    Spouse name: Not on file  . Number of children: 1  . Years of education: Not on file  . Highest education level: Not on file  Occupational History  . Not on file  Social Needs  . Financial resource strain: Somewhat hard  . Food insecurity:    Worry: Sometimes true    Inability: Sometimes true  . Transportation needs:    Medical: Yes    Non-medical: Yes  Tobacco Use  . Smoking status: Former Smoker    Packs/day: 0.50    Years: 20.00    Pack years: 10.00    Types: Cigarettes    Last attempt to quit: 04/30/2014    Years since quitting: 4.4  . Smokeless tobacco: Never Used  . Tobacco comment: smoked off and on x 20 years  Substance and Sexual Activity  . Alcohol use: No    Alcohol/week: 0.0 standard drinks  . Drug use: Not Currently    Types: Cocaine  . Sexual activity: Not Currently  Lifestyle  . Physical activity:    Days per week: 0 days    Minutes per session: 0 min  . Stress: Rather much  Relationships  . Social connections:    Talks on phone: Twice a week    Gets together: Once a week    Attends religious service: 1 to 4 times per year    Active member of club or organization: No    Attends meetings of clubs or organizations: Never    Relationship status: Living with partner  . Intimate partner violence:    Fear of current or ex partner: Patient refused    Emotionally abused: Patient refused    Physically abused: Patient refused    Forced sexual activity: Patient refused  Other Topics Concern  . Not on file  Social History Narrative   Origibnally from [place]   Most recently from [place]   Daughter lives in town      On Social security to CHF, COPD, CAD    Family History:    Family History  Problem Relation Age of Onset  . Stroke Mother   . Hypertension Mother   . Colon cancer Father   . Diabetes Father   .  Heart attack Father   . Sarcoidosis Other   . Breast cancer Paternal Aunt   . Emphysema Brother   . Lung disease Brother        Unknown type, 3 brothers, one with liver and lung disease  . Asthma Paternal Aunt      ROS:  Please see the history of present illness.  General:no colds or fevers, + weight increase Skin:no rashes or ulcers HEENT:no blurred vision, no congestion CV:see HPI PUL:see HPI GI:no diarrhea constipation or melena, no indigestion GU:no hematuria, no dysuria MS:no joint pain, no claudication Neuro:no syncope, no lightheadedness Endo:no diabetes, no thyroid disease  All other ROS reviewed and negative.     Physical Exam/Data:   Vitals:   10/26/18 0450 10/26/18 0500 10/26/18 0842 10/26/18 0852  BP: 122/65  111/66 111/66  Pulse: (!) 54 (!) 50 (!) 49 (!) 49  Resp: (!) 24 18 (!) 23   Temp: (!) 97.1 F (36.2 C)  (!) 96.3 F (35.7 C)   TempSrc: Axillary  Axillary   SpO2: 96% 100% 98%   Weight:  (!) 148.6 kg    Height:        Intake/Output Summary (Last 24 hours) at 10/26/2018 1046 Last data filed at 10/26/2018 0842 Gross per 24 hour  Intake 60 ml  Output 1200 ml  Net -1140 ml   Last 3 Weights 10/26/2018 10/25/2018 10/25/2018  Weight (lbs) 327 lb 9.7 oz 348 lb 12.3 oz 347 lb 0.1 oz  Weight (kg) 148.6 kg 158.2 kg 157.4 kg     Body mass index is 51.31 kg/m.  General:  Well nourished, well developed, in no acute distress HEENT: normal Lymph: no adenopathy Neck: no JVD sitting up on [facility] Endocrine:  No thryomegaly Vascular: No carotid bruits; unable to palpate pedal pulses with edema.   Cardiac:  normal S1, S2; RRR; no murmur gallup or rub Lungs:  Diminished to auscultation bilaterally, no wheezing, rhonchi or rales  Abd: soft, nontender, no hepatomegaly but + edema to waist  Ext: + edema 4+ legs are very tight to waist.  Musculoskeletal:  No deformities, BUE and BLE strength normal and equal Skin: warm and dry  Neuro:  Alert and oriented, speech is  slow  no focal abnormalities noted walks with a walker,  Psych:  Normal to flat affect     Relevant CV Studies: Cardiolite 08/29/2013: Extensive LAD distribution scar with severe systolic dysfunction. No reversible ischemia. EF roughly 28%  LHC 10/07/13 With severe single-vessel disease with distal RCA 99% thrombotic subtotal occlusion, successful PCI of the distal RCA with a Xience Alpine 2.75 mm x 18mm postdilated to 3.1 mm Mild in-stent restenosis in the LAD stent. Dilated Left Ventricle with global hypokinesis and EF of roughly 30%.  ECHO 10/07/13: EF 35-40%, grade II DD , mild MR Echo 2/16: EF 40-45%, apical hypokinesis ECHO 2018 EF 35-40% Grade IIDD>  Echo 2019  EF 35-40% no change from 2018 Echo 05/31/18  EF 40-45% G2DD, PA pk pressure 71 mmHg. RV moderately dilated. LA mildly dilated.  No MR  Laboratory Data:  Chemistry Recent Labs  Lab 10/25/18 0309 10/25/18 0630 10/26/18 0013  NA 140 141 141  K 4.6 5.4* 4.9  CL 108 107 107  CO2 22 21* 27  GLUCOSE 180* 173* 200*  BUN 50* 48* 53*  CREATININE 1.87* 1.80* 1.93*  CALCIUM 8.8* 9.2 9.2  GFRNONAA 29* 30* 28*  GFRAA 34* 35* 32*  ANIONGAP 10 13 7     Recent Labs  Lab 10/25/18 0928  PROT 9.1*  ALBUMIN 3.1*  AST 28  ALT 16  ALKPHOS 140*  BILITOT 1.2   Hematology Recent Labs  Lab 10/25/18 0309 10/25/18 0724 10/26/18 0013  WBC 5.7 5.1 5.7  RBC 3.83* 4.33 3.73*  HGB 10.2* 11.3* 9.8*  HCT 33.7* 38.1 32.2*  MCV 88.0 88.0 86.3  MCH 26.6 26.1 26.3  MCHC 30.3 29.7* 30.4  RDW 17.5* 17.6* 17.3*  PLT 228 197 216   Cardiac Enzymes Recent Labs  Lab 10/25/18 0928 10/25/18 1553 10/26/18 0013  TROPONINI <0.03 <0.03 <0.03   No results for input(s): TROPIPOC in the last 168 hours.  BNP Recent Labs  Lab 10/25/18 0309  BNP 709.4*    DDimer No results for input(s): DDIMER in the last 168 hours.  Radiology/Studies:  Dg Chest Port 1 View  Result Date: 10/25/2018 CLINICAL DATA:  Shortness of breath EXAM: PORTABLE  CHEST 1 VIEW COMPARISON:  07/08/2018 FINDINGS: Cardiac shadow remains enlarged. Lungs are well aerated bilaterally. Chronic scarring in the left lateral lung is noted. No acute infiltrate or sizable effusion is seen. IMPRESSION: Chronic changes without acute abnormality. Electronically Signed   By: Inez Catalina M.D.   On: 10/25/2018 02:13    Assessment and Plan:   1. Acute on chronic combined systolic and diastolic HF- now neg 1.6 L BNP at 700.  EF in Jan had improved to 40-45%  Home lasix was 40 mg daily.  Improving but still with severe edema.  Dr. Aundra Silva to see. May need co-ox and IV milrinone but will defer to Dr. Aundra Silva (for last year BNP hast been 652 to 1218.  May need spironolactone.  12/19 on CT abd + for moderate anasarca -  Wt down 20 lbs though not sure if correct if only neg 1.6 L  Will add BiDil half a tab TID 2. Anasarca    3. CAD with hx of MIs and stents to RCA and LAD neg troponin and no chest pain and no change in EKG   Her prasugrel was stopped in Jan due to acute anemia. On ASA coreg 3.125 BID, and statin 4. Sinus brady to 44 at times.  Hx of brady with higher doses of coreg.  Cannot increase BB   5. DM-2 on insulin per primary team  6. COPD with acute exacerbation on IV steroids.  7. GERD on priolosec 8. Hypoventilation syndrome and OSA on BiPAP at night. And oxygen 24/7 9. HLD on crestor. Lipids in 05/2018 LDL 43 HDL 25 and Tchol 83. Continue statin. Needs lipid panel, crestor was started in Jan 2020 10. Hx of CVA 1/20 with slowed speech.  11. Iron def anemia and B12 def.  Check iron levels and ferritin. Ordered by primary team. 12. Hx of elevated ammonia levels 05/2018      For questions or updates, please contact [place] Please consult [email or web address] for contact info under   Signed, Cecilie Kicks, NP  10/26/2018 10:46 AM   Patient seen with NP, agree with the above note.   She has been admitted with acute on chronic systolic CHF.  She has diuresed some here but  creatinine  is also above baseline. At baseline, she has COPD on home O2 and probable OHS/OSA on CPAP.  She had a CVA in 12/19 in setting of cocaine use.  Also with history of CAD and PE in the past.   General: NAD, morbidly obese.  Neck: Thick, JVP difficult but appears elevated, no thyromegaly or thyroid nodule.  Lungs: Clear to auscultation bilaterally with normal respiratory effort. CV: Nonpalpable PMI.  Heart mildly brady, regular S1/S2, no S3/S4, no murmur.  2+ edema to knees.  No carotid bruit.  Unable to palpate pedal pulses.  Abdomen: Soft, nontender, no hepatosplenomegaly, moderate distention.  Skin: Intact without lesions or rashes.  Neurologic: Alert and oriented x 3.  Psych: Normal affect. Extremities: No clubbing or cyanosis.  HEENT: Normal.   Assessment/Plan: 1. Acute on chronic systolic CHF: Suspect ischemic cardiomyopathy.  Last echo in 1/20 with EF 40-45%, RV moderately dilated, PASP 71 mmHg.  She appears to have anasarca.  JVP is difficult but suspect elevated.  Abdominal distention suggests possibility of ascites as well.  Suspect significant RV failure, may be related to OHS/OSA and COPD. Picture is complicated by cardiorenal syndrome.  - Continue Lasix 60 mg IV every 8 hrs for now, will add a dose of metolazone today.  - Follow creatinine closely.  She needs extensive diuresis but may need RHC to guide if creatinine rises further.  - Continue low dose Coreg 3.125 mg bid.  - Add Bidil 1/2 tab tid.  - I am going to repeat an echocardiogram.  - Unna boots for lower legs.  2. AKI: Baseline creatinine around 1.3, has been 1.8-1.9 this admission.  Suspect cardiorenal syndrome.  Hopefully can see some improvement in creatinine as we lower renal venous pressure.  Continue to diurese aggressively for now as above.  3. COPD: On home oxygen at baseline.  Has quit smoking.  4. OHS/OSA: On home oxygen at all times and Bipap at night.  5. CAD: History of PCI, most recently had inferior  STEMI in 5/15 with DES to RCA.  No chest pain, troponin not elevated.  - Continue ASA 81 and Crestor 40 daily.  6. Remote PE: On Lovenox for DVT prophylaxis.  7. Anemia: History of GI bleeding, had endoscopies earlier this year with no bleeding source Silva.  Also with history of B12 deficiency.  Hgb 9.8 today.  - Check iron studies.   Loralie Champagne 10/26/2018 11:48 AM  - No indication for coronary angiography at this time.

## 2018-10-26 NOTE — Evaluation (Addendum)
Physical Therapy Evaluation Patient Details Name: Tamara Silva MRN: [id number] DOB: August 25, 1960 Today's Date: 10/26/2018   History of Present Illness  Pt is a 58 yo female s/p SOB, edema with a neqative CT of chest , 4L O2 dependent. PMHx: recent CVA with seizure requiring intubation, EF of 40-50%, DMT2, chronic kidney dx, CAD, obesity, presents to the ER because of increasing shortness of breath over the last few days with increasing peripheral edema.   Clinical Impression  Pt admitted with/for SOB.  Pt needing min guard to minimal assist for mobility and is limited by deconditioning, dyspnea.  Pt currently limited functionally due to the problems listed below.  (see problems list.)  Pt will benefit from PT to maximize function and safety to be able to get home safely with available assist.     Follow Up Recommendations Home health PT;Supervision - Intermittent;Supervision/Assistance - 24 hour    Equipment Recommendations  None recommended by PT    Recommendations for Other Services       Precautions / Restrictions Precautions Precautions: Fall;Other (comment) Precaution Comments: watch O2; frequent urination Restrictions Weight Bearing Restrictions: No      Mobility  Bed Mobility Overal bed mobility: Needs Assistance Bed Mobility: Sit to Supine   Sidelying to sit: Mod assist Supine to sit: Mod assist Sit to supine: Min guard   General bed mobility comments: pt able to pivot, lay down and struggle to pull both legs into bed without assist  Transfers Overall transfer level: Needs assistance Equipment used: Rolling walker (2 wheeled)(3in1) Transfers: Sit to/from [facility];Lateral/Scoot Transfers Sit to Stand: Min guard(from BSC) Stand pivot transfers: Min guard      Lateral/Scoot Transfers: Min guard General transfer comment: use of hands appropriately  Ambulation/Gait Ambulation/Gait assistance: Min guard Gait Distance (Feet): 10 Feet Assistive  device: Rolling walker (2 wheeled) Gait Pattern/deviations: Step-through pattern   Gait velocity interpretation: <1.31 ft/sec, indicative of household ambulator General Gait Details: generally steady, easily fatigued, noticeable dyspnea, but with VSS  Stairs            Wheelchair Mobility    Modified Rankin (Stroke Patients Only)       Balance Overall balance assessment: Needs assistance Sitting-balance support: Single extremity supported Sitting balance-Leahy Scale: Fair     Standing balance support: Bilateral upper extremity supported;No upper extremity supported Standing balance-Leahy Scale: Fair                               Pertinent Vitals/Pain      Home Living Family/patient expects to be discharged to:: Private residence Living Arrangements: Spouse/significant other;Other relatives(daughter) Available Help at Discharge: Family;Available 24 hours/day Type of Home: House Home Access: Stairs to enter Entrance Stairs-Rails: None Entrance Stairs-Number of Steps: 2 Home Layout: One level Home Equipment: Cane - single point;Bedside commode;Walker - 2 wheels      Prior Function Level of Independence: Needs assistance   Gait / Transfers Assistance Needed: Ambulates short distances/household distances with RW and w/c on the porch and in the community  ADL's / Homemaking Assistance Needed: modified independent with UB ADL and requiring assist for LB ADL  Comments: Slip in May but was able to sit on commode quickly without falling     Hand Dominance   Dominant Hand: Right    Extremity/Trunk Assessment   Upper Extremity Assessment Upper Extremity Assessment: Generalized weakness RUE Deficits / Details: s/p CVA residual weakness    Lower Extremity  Assessment Lower Extremity Assessment: Generalized weakness(but generally functional on a limited basis)    Cervical / Trunk Assessment Cervical / Trunk Assessment: Other exceptions Cervical /  Trunk Exceptions: large body habitus  Communication   Communication: No difficulties  Cognition Arousal/Alertness: Awake/alert Behavior During Therapy: WFL for tasks assessed/performed;Flat affect Overall Cognitive Status: Within Functional Limits for tasks assessed                                        General Comments General comments (skin integrity, edema, etc.): Sats mid 90's on RA during limited mobility    Exercises     Assessment/Plan    PT Assessment Patient needs continued PT services  PT Problem List Decreased activity tolerance;Decreased strength;Cardiopulmonary status limiting activity;Decreased mobility;Obesity       PT Treatment Interventions Gait training;Stair training;Functional mobility training;Therapeutic activities;Patient/family education    PT Goals (Current goals can be found in the Care Plan section)  Acute Rehab PT Goals Patient Stated Goal: to go home PT Goal Formulation: With patient Time For Goal Achievement: 11/09/18 Potential to Achieve Goals: Good    Frequency Min 3X/week   Barriers to discharge        Co-evaluation               AM-PAC PT "6 Clicks" Mobility  Outcome Measure Help needed turning from your back to your side while in a flat bed without using bedrails?: A Little Help needed moving from lying on your back to sitting on the side of a flat bed without using bedrails?: A Little Help needed moving to and from a bed to a chair (including a wheelchair)?: A Little Help needed standing up from a chair using your arms (e.g., wheelchair or bedside chair)?: A Little Help needed to walk in hospital room?: A Little Help needed climbing 3-5 steps with a railing? : A Little 6 Click Score: 18    End of Session     Patient left: in bed;with call bell/phone within reach;with nursing/sitter in room Nurse Communication: Mobility status PT Visit Diagnosis: Other abnormalities of gait and mobility (R26.89);Muscle  weakness (generalized) (M62.81);Difficulty in walking, not elsewhere classified (R26.2)    Time: 1208-1224 PT Time Calculation (min) (ACUTE ONLY): 16 min   Charges:   PT Evaluation $PT Eval Low Complexity: 1 Low          10/26/2018  Donnella Sham, PT Acute Rehabilitation Services [phone]  (pager) [phone]  (office)  Tessie Fass Tyaire Odem 10/26/2018, 12:42 PM

## 2018-10-27 ENCOUNTER — Inpatient Hospital Stay: Payer: Self-pay

## 2018-10-27 DIAGNOSIS — I5043 Acute on chronic combined systolic (congestive) and diastolic (congestive) heart failure: Secondary | ICD-10-CM

## 2018-10-27 LAB — RETICULOCYTES
Immature Retic Fract: 13 % (ref 2.3–15.9)
RBC.: 3.68 MIL/uL — ABNORMAL LOW (ref 3.87–5.11)
Retic Count, Absolute: 55.2 10*3/uL (ref 19.0–186.0)
Retic Ct Pct: 1.5 % (ref 0.4–3.1)

## 2018-10-27 LAB — MAGNESIUM: Magnesium: 2.5 mg/dL — ABNORMAL HIGH (ref 1.7–2.4)

## 2018-10-27 LAB — IRON AND TIBC
Iron: 34 ug/dL (ref 28–170)
Saturation Ratios: 9 % — ABNORMAL LOW (ref 10.4–31.8)
TIBC: 371 ug/dL (ref 250–450)
UIBC: 337 ug/dL

## 2018-10-27 LAB — COOXEMETRY PANEL
Carboxyhemoglobin: 1.4 % (ref 0.5–1.5)
Methemoglobin: 1 % (ref 0.0–1.5)
O2 Saturation: 89.5 %
Total hemoglobin: 10.1 g/dL — ABNORMAL LOW (ref 12.0–16.0)

## 2018-10-27 LAB — GLUCOSE, CAPILLARY
Glucose-Capillary: 121 mg/dL — ABNORMAL HIGH (ref 70–99)
Glucose-Capillary: 148 mg/dL — ABNORMAL HIGH (ref 70–99)
Glucose-Capillary: 115 mg/dL — ABNORMAL HIGH (ref 70–99)
Glucose-Capillary: 119 mg/dL — ABNORMAL HIGH (ref 70–99)

## 2018-10-27 LAB — BASIC METABOLIC PANEL
Anion gap: 9 (ref 5–15)
BUN: 60 mg/dL — ABNORMAL HIGH (ref 6–20)
CO2: 27 mmol/L (ref 22–32)
Calcium: 9 mg/dL (ref 8.9–10.3)
Chloride: 105 mmol/L (ref 98–111)
Creatinine, Ser: 1.79 mg/dL — ABNORMAL HIGH (ref 0.44–1.00)
GFR calc Af Amer: 36 mL/min — ABNORMAL LOW (ref >60)
GFR calc non Af Amer: 31 mL/min — ABNORMAL LOW (ref >60)
Glucose, Bld: 119 mg/dL — ABNORMAL HIGH (ref 70–99)
Potassium: 4.2 mmol/L (ref 3.5–5.1)
Sodium: 141 mmol/L (ref 135–145)

## 2018-10-27 LAB — VITAMIN B12: Vitamin B-12: 1453 pg/mL — ABNORMAL HIGH (ref 180–914)

## 2018-10-27 LAB — FERRITIN: Ferritin: 76 ng/mL (ref 11–307)

## 2018-10-27 LAB — FOLATE: Folate: 27.9 ng/mL (ref >5.9)

## 2018-10-27 MED ORDER — FUROSEMIDE 10 MG/ML IJ SOLN
20.0000 mg/h | INTRAVENOUS | Status: DC
  Administered 2018-10-27: 12 mg/h via INTRAVENOUS
  Administered 2018-10-28: 15 mg/h via INTRAVENOUS
  Administered 2018-10-28 – 2018-10-30 (×4): 30 mg/h via INTRAVENOUS
  Administered 2018-10-30 – 2018-11-04 (×8): 20 mg/h via INTRAVENOUS
  Filled 2018-10-27 (×3): qty 25
  Filled 2018-10-27: qty 21
  Filled 2018-10-27: qty 25
  Filled 2018-10-27: qty 8
  Filled 2018-10-27 (×2): qty 25
  Filled 2018-10-27: qty 21
  Filled 2018-10-27 (×9): qty 25
  Filled 2018-10-27: qty 20
  Filled 2018-10-27: qty 21

## 2018-10-27 MED ORDER — SODIUM CHLORIDE 0.9 % IV SOLN
125.0000 mg | Freq: Every day | INTRAVENOUS | Status: AC
  Administered 2018-10-27 – 2018-10-29 (×3): 125 mg via INTRAVENOUS
  Filled 2018-10-27 (×5): qty 10

## 2018-10-27 MED ORDER — SPIRONOLACTONE 12.5 MG HALF TABLET
12.5000 mg | ORAL_TABLET | Freq: Every day | ORAL | Status: DC
  Administered 2018-10-27: 12.5 mg via ORAL
  Filled 2018-10-27 (×2): qty 1

## 2018-10-27 MED ORDER — SODIUM CHLORIDE 0.9 % IV SOLN
INTRAVENOUS | Status: DC | PRN
  Administered 2018-10-27: 1000 mL via INTRAVENOUS

## 2018-10-27 MED ORDER — SODIUM CHLORIDE 0.9% FLUSH: 10.0000 mL | INTRAVENOUS | Status: DC | PRN

## 2018-10-27 MED ORDER — SODIUM CHLORIDE 0.9 % IV SOLN: 510.0000 mg | Freq: Once | INTRAVENOUS | Status: DC

## 2018-10-27 MED ORDER — FERROUS GLUCONATE 324 (38 FE) MG PO TABS
324.0000 mg | ORAL_TABLET | Freq: Every day | ORAL | Status: DC
  Administered 2018-10-27 – 2018-11-07 (×8): 324 mg via ORAL
  Filled 2018-10-27 (×9): qty 1

## 2018-10-27 NOTE — Progress Notes (Signed)
PROGRESS NOTE    Tamara Silva  HDQ:[id number] DOB: 02/28/1961 DOA: 10/25/2018 PCP: Antony Blackbird, MD   Brief Narrative:  58 year old with a history of systolic CHF ejection fraction 40-45%- January 2020, obesity hypoventilation on BiPAP at bedtime, diabetes mellitus type 2, CKD, CAD, recent CVA requiring intubation for seizure presented for increasing shortness of breath and peripheral edema.  Chest x-ray overall unremarkable.  BNP slightly elevated, creatinine elevated from baseline of 1.3 to1.8.  Started on IV diuretics.  Also some component of COPD exacerbation. Cardiology consulted give her adv cardiac issues. Due to signficant amount of overload, PICC was placed to monitor CVP and CoOx, started on Lasix drip.    Assessment & Plan:   Principal Problem:   Acute respiratory failure with hypoxia (HCC) Active Problems:   Morbid obesity ([facility])   CAD S/P percutaneous coronary angioplasty - prior PCI to LAD; RCA PCI: new Xience Alpine DES 2.75 mm x 15 mm    COPD exacerbation (HCC)   CKD (chronic kidney disease), stage II   Uncontrolled type 2 diabetes mellitus without complication, with long-term current use of insulin (HCC)   OSA (obstructive sleep apnea)   Tobacco abuse disorder   Hypertension   Iron deficiency anemia   Diabetes mellitus type 2 in obese (HCC)   Acute on chronic diastolic CHF (congestive heart failure) (HCC)   Slow transit constipation  Acute hypoxic and hypercarbic respiratory distress Acute combined diastolic and systolic congestive heart failure with ejection fraction 11% grade 2 diastolic dysfunction, class III -Continue aggressive, change to lasix drip. PICC to be placed for CVP and CoOc monitoring.  Monitor input and output.  Daily weights. -Bronchodilator treatment, hold off on steroid use. -Cardiology added Bidil and Aldactone.  -Monitor electrolytes -Echo in January 2020-ejection fraction 40-45% with grade 2 diastolic dysfunction  Acute kidney injury on CKD  stage III; suspect Cardiorenal.  -Baseline creatinine 1.3, on admission it was 1.8. Monitor closely with aggressive diuresis.   Constipation -Aggressive bowel regimen  Diabetes mellitus type 2 -On insulin sliding scale, Levemir 18 units daily.  NovoLog 2 units pre-meals  Obesity hypoventilation syndrome on BiPAP at bedtime -Supportive care.  Advised to lose weight.  Would benefit from outpatient follow-up with bariatric surgery. On BiPAP at nighttime.   Anemia of chronic disease -Saturation ratios quite low, will likely of poor absorption of oral iron therefore will give 3 days of IV iron here before resuming her oral iron.  History of CVA complicated by seizure -On aspirin and statin  History of CAD status post stenting -On aspirin statin and Coreg  Essential hypertension -Continue Coreg  Daily tobacco use -Counseled to quit smoking  DVT prophylaxis: Lovenox Code Status: Full code Family Communication: None at bedside Disposition Plan: Inpatient stay, will be on lasix drip to augment her diuresis.   Consultants:   None  Procedures:   None  Antimicrobials:   None   Subjective: Sitting up in the chair. Ok response to IV diuretics. Feels slightly better but still significant amount of swelling. Had multiple BM yesterday   Review of Systems Otherwise negative except as per HPI, including: General = no fevers, chills, dizziness, malaise, fatigue HEENT/EYES = negative for pain, redness, loss of vision, double vision, blurred vision, loss of hearing, sore throat, hoarseness, dysphagia Cardiovascular= negative for chest pain, palpitation, murmurs, lower extremity swelling Respiratory/lungs= negative for hemoptysis Gastrointestinal= negative for nausea, vomiting,, abdominal pain, melena, hematemesis Genitourinary= negative for Dysuria, Hematuria, Change in Urinary Frequency MSK = Negative for arthralgia,  myalgias, Back Pain, Joint swelling  Neurology= Negative for  headache, seizures, numbness, tingling  Psychiatry= Negative for anxiety, depression, suicidal and homocidal ideation Allergy/Immunology= Medication/Food allergy as listed  Skin= Negative for Rash, lesions, ulcers, itching  Objective: Vitals:   10/27/18 0345 10/27/18 0859 10/27/18 0902 10/27/18 0918  BP: 112/64   120/66  Pulse: (!) 58   63  Resp: (!) 27   (!) 22  Temp: (!) 96.8 F (36 C)     TempSrc: Axillary     SpO2: 99% 98% 96% 96%  Weight: (!) 141.9 kg     Height:        Intake/Output Summary (Last 24 hours) at 10/27/2018 1219 Last data filed at 10/27/2018 1059 Gross per 24 hour  Intake 240 ml  Output 725 ml  Net -485 ml   Filed Weights   10/25/18 0800 10/26/18 0500 10/27/18 0345  Weight: (!) 158.2 kg (!) 148.6 kg (!) 141.9 kg    Examination:  Constitutional: NAD, calm, comfortable, daily nasal cannula with intermittent BiPAP use Eyes: PERRL, lids and conjunctivae normal ENMT: Mucous membranes are moist. Posterior pharynx clear of any exudate or lesions.Normal dentition.  Neck: normal, supple, no masses, no thyromegaly Respiratory: Bilateral diminished breath sounds Cardiovascular: Regular rate and rhythm, no murmurs / rubs / gallops.  3+ lower extremity pitting edema. 2+ pedal pulses. No carotid bruits.  Abdomen: no tenderness, no masses palpated. No hepatosplenomegaly. Bowel sounds positive.  Musculoskeletal: no clubbing / cyanosis. No joint deformity upper and lower extremities. Good ROM, no contractures. Normal muscle tone.  Skin: no rashes, lesions, ulcers. No induration Neurologic: CN 2-12 grossly intact. Sensation intact, DTR normal. Strength 5/5 in all 4.  Psychiatric: Normal judgment and insight. Alert and oriented x 3. Normal mood.   Data Reviewed:   CBC: Recent Labs  Lab 10/25/18 0309 10/25/18 0724 10/26/18 0013  WBC 5.7 5.1 5.7  NEUTROABS 3.1 3.3  --   HGB 10.2* 11.3* 9.8*  HCT 33.7* 38.1 32.2*  MCV 88.0 88.0 86.3  PLT 228 197 542   Basic  Metabolic Panel: Recent Labs  Lab 10/25/18 0309 10/25/18 0928 10/26/18 0013 10/27/18 0314  NA 140 141 141 141  K 4.6 5.4* 4.9 4.2  CL 108 107 107 105  CO2 22 21* 27 27  GLUCOSE 180* 173* 200* 119*  BUN 50* 48* 53* 60*  CREATININE 1.87* 1.80* 1.93* 1.79*  CALCIUM 8.8* 9.2 9.2 9.0  MG  --  2.6* 2.6* 2.5*   GFR: Estimated Creatinine Clearance: 50.7 mL/min (A) (by C-G formula based on SCr of 1.79 mg/dL (H)). Liver Function Tests: Recent Labs  Lab 10/25/18 0928 10/26/18 1220  AST 28 21  ALT 16 16  ALKPHOS 140* 124  BILITOT 1.2 0.7  PROT 9.1* 9.0*  ALBUMIN 3.1* 3.1*   No results for input(s): LIPASE, AMYLASE in the last 168 hours. No results for input(s): AMMONIA in the last 168 hours. Coagulation Profile: No results for input(s): INR, PROTIME in the last 168 hours. Cardiac Enzymes: Recent Labs  Lab 10/25/18 0928 10/25/18 1553 10/26/18 0013  TROPONINI <0.03 <0.03 <0.03   BNP (last 3 results) No results for input(s): PROBNP in the last 8760 hours. HbA1C: No results for input(s): HGBA1C in the last 72 hours. CBG: Recent Labs  Lab 10/26/18 0609 10/26/18 1128 10/26/18 1717 10/26/18 2126 10/27/18 0609  GLUCAP 173* 164* 172* 125* 121*   Lipid Profile: Recent Labs    10/26/18 1220  CHOL 159  HDL 40*  LDLCALC  104*  TRIG 75  CHOLHDL 4.0   Thyroid Function Tests: No results for input(s): TSH, T4TOTAL, FREET4, T3FREE, THYROIDAB in the last 72 hours. Anemia Panel: Recent Labs    10/27/18 0314  VITAMINB12 1,453*  FOLATE 27.9  FERRITIN 76  TIBC 371  IRON 34  RETICCTPCT 1.5   Sepsis Labs: No results for input(s): PROCALCITON, LATICACIDVEN in the last 168 hours.  Recent Results (from the past 240 hour(s))  SARS Coronavirus 2 (CEPHEID - Performed in [facility] hospital lab), Hosp Order     Status: None   Collection Time: 10/25/18  3:33 AM  Result Value Ref Range Status   SARS Coronavirus 2 NEGATIVE NEGATIVE Final    Comment: (NOTE) If result is  NEGATIVE SARS-CoV-2 target nucleic acids are NOT DETECTED. The SARS-CoV-2 RNA is generally detectable in upper and lower  respiratory specimens during the acute phase of infection. The lowest  concentration of SARS-CoV-2 viral copies this assay can detect is 250  copies / mL. A negative result does not preclude SARS-CoV-2 infection  and should not be used as the sole basis for treatment or other  patient management decisions.  A negative result may occur with  improper specimen collection / handling, submission of specimen other  than nasopharyngeal swab, presence of viral mutation(s) within the  areas targeted by this assay, and inadequate number of viral copies  (<250 copies / mL). A negative result must be combined with clinical  observations, patient history, and epidemiological information. If result is POSITIVE SARS-CoV-2 target nucleic acids are DETECTED. The SARS-CoV-2 RNA is generally detectable in upper and lower  respiratory specimens dur ing the acute phase of infection.  Positive  results are indicative of active infection with SARS-CoV-2.  Clinical  correlation with patient history and other diagnostic information is  necessary to determine patient infection status.  Positive results do  not rule out bacterial infection or co-infection with other viruses. If result is PRESUMPTIVE POSTIVE SARS-CoV-2 nucleic acids MAY BE PRESENT.   A presumptive positive result was obtained on the submitted specimen  and confirmed on repeat testing.  While 2019 novel coronavirus  (SARS-CoV-2) nucleic acids may be present in the submitted sample  additional confirmatory testing may be necessary for epidemiological  and / or clinical management purposes  to differentiate between  SARS-CoV-2 and other Sarbecovirus currently known to infect humans.  If clinically indicated additional testing with an alternate test  methodology [phone]) is advised. The SARS-CoV-2 RNA is generally  detectable  in upper and lower respiratory sp ecimens during the acute  phase of infection. The expected result is Negative. Fact Sheet for Patients:  StrictlyIdeas.no Fact Sheet for Healthcare Providers: BankingDealers.co.za This test is not yet approved or cleared by the [place] FDA and has been authorized for detection and/or diagnosis of SARS-CoV-2 by FDA under an Emergency Use Authorization (EUA).  This EUA will remain in effect (meaning this test can be used) for the duration of the COVID-19 declaration under Section 564(b)(1) of the Act, 21 U.S.C. section 360bbb-3(b)(1), unless the authorization is terminated or revoked sooner. Performed at [facility], [place] [address] [place], [place] [address]          Radiology Studies: [place] Ekg Site Rite  Result Date: 10/27/2018 If [facility] image not attached, placement could not be confirmed due to current cardiac rhythm.       Scheduled Meds:  aspirin EC  81 mg Oral Daily   carvedilol  3.125 mg  Oral BID WC   enoxaparin (LOVENOX) injection  80 mg Subcutaneous Daily   [START ON 10/31/2018] ferrous gluconate  324 mg Oral Q breakfast   insulin aspart  0-9 Units Subcutaneous TID WC   insulin detemir  18 Units Subcutaneous QHS   ipratropium-albuterol  3 mL Nebulization TID   isosorbide-hydrALAZINE  0.5 tablet Oral TID   mometasone-formoterol  2 puff Inhalation BID   pantoprazole  40 mg Oral Daily   polyethylene glycol  17 g Oral Q M,W,F   rosuvastatin  40 mg Oral q1800   senna-docusate  1 tablet Oral BID   spironolactone  12.5 mg Oral Daily   Continuous Infusions:  ferric gluconate (FERRLECIT/NULECIT) IV     furosemide (LASIX) infusion       LOS: 2 days   Time spent= 40 mins    Sharline Lehane Arsenio Loader, MD [facility]  If 7PM-7AM, please contact night-coverage [email or web address] 10/27/2018, 12:19 PM

## 2018-10-27 NOTE — Progress Notes (Signed)
Pt PICC line in place. Co-ax drawn. Ferric gluconate started. CVP of 26. Will continue to monitor.  Jerald Kief, RN

## 2018-10-27 NOTE — Progress Notes (Signed)
Peripherally Inserted Central Catheter/Midline Placement  The IV Nurse has discussed with the patient and/or persons authorized to consent for the patient, the purpose of this procedure and the potential benefits and risks involved with this procedure.  The benefits include less needle sticks, lab draws from the catheter, and the patient may be discharged home with the catheter. Risks include, but not limited to, infection, bleeding, blood clot (thrombus formation), and puncture of an artery; nerve damage and irregular heartbeat and possibility to perform a PICC exchange if needed/ordered by physician.  Alternatives to this procedure were also discussed.  Bard Power PICC patient education guide, fact sheet on infection prevention and patient information card has been provided to patient /or left at bedside.    PICC/Midline Placement Documentation        Darlyn Read 10/27/2018, 1:26 PM

## 2018-10-27 NOTE — Progress Notes (Signed)
Patient ID: Tamara Silva, female   DOB: [birth year], 59 y.o.   MRN: [id number]     Advanced Heart Failure Rounding Note  PCP-Cardiologist: Quay Burow, MD   Subjective:    Weight is down though I/Os do not appear to be fully recorded.  She still feels short of breath.   Echo: EF 30-35%, mild RV dilation.    Objective:   Weight Range: (!) 141.9 kg Body mass index is 49 kg/m.   Vital Signs:   Temp:  [96.4 F (35.8 C)-96.8 F (36 C)] 96.8 F (36 C) (05/29 0345) Pulse Rate:  [51-61] 58 (05/29 0345) Resp:  [21-27] 27 (05/29 0345) BP: (112-132)/(63-72) 112/64 (05/29 0345) SpO2:  [97 %-100 %] 99 % (05/29 0345) FiO2 (%):  [40 %] 40 % (05/29 0345) Weight:  [141.9 kg] 141.9 kg (05/29 0345) Last BM Date: 10/26/18  Weight change: Filed Weights   10/25/18 0800 10/26/18 0500 10/27/18 0345  Weight: (!) 158.2 kg (!) 148.6 kg (!) 141.9 kg    Intake/Output:   Intake/Output Summary (Last 24 hours) at 10/27/2018 0901 Last data filed at 10/27/2018 0300 Gross per 24 hour  Intake -  Output 350 ml  Net -350 ml      Physical Exam    General:  Well appearing. No resp difficulty HEENT: Normal Neck: Supple. JVP 14 cm. Carotids 2+ bilat; no bruits. No lymphadenopathy or thyromegaly appreciated. Cor: PMI nonpalpable. Regular rate & rhythm. No rubs, gallops or murmurs. Lungs: Rhonchi bilaterally.  Abdomen: Soft, nontender, mild to moderately distended. No hepatosplenomegaly. No bruits or masses. Good bowel sounds. Extremities: No cyanosis, clubbing, rash. 1+ edema to thighs, Unna boots on.  Neuro: Alert & orientedx3, cranial nerves grossly intact. moves all 4 extremities w/o difficulty. Affect pleasant   Telemetry   NSR 50s-60s, personally reviewed.   Labs    CBC Recent Labs    10/25/18 0309 10/25/18 0724 10/26/18 0013  WBC 5.7 5.1 5.7  NEUTROABS 3.1 3.3  --   HGB 10.2* 11.3* 9.8*  HCT 33.7* 38.1 32.2*  MCV 88.0 88.0 86.3  PLT 228 197 536   Basic Metabolic Panel  Recent Labs    10/26/18 0013 10/27/18 0314  NA 141 141  K 4.9 4.2  CL 107 105  CO2 27 27  GLUCOSE 200* 119*  BUN 53* 60*  CREATININE 1.93* 1.79*  CALCIUM 9.2 9.0  MG 2.6* 2.5*   Liver Function Tests Recent Labs    10/25/18 0928 10/26/18 1220  AST 28 21  ALT 16 16  ALKPHOS 140* 124  BILITOT 1.2 0.7  PROT 9.1* 9.0*  ALBUMIN 3.1* 3.1*   No results for input(s): LIPASE, AMYLASE in the last 72 hours. Cardiac Enzymes Recent Labs    10/25/18 0928 10/25/18 1553 10/26/18 0013  TROPONINI <0.03 <0.03 <0.03    BNP: BNP (last 3 results) Recent Labs    06/24/18 2039 07/08/18 2100 10/25/18 0309  BNP 652.9* 669.1* 709.4*    ProBNP (last 3 results) No results for input(s): PROBNP in the last 8760 hours.   D-Dimer No results for input(s): DDIMER in the last 72 hours. Hemoglobin A1C No results for input(s): HGBA1C in the last 72 hours. Fasting Lipid Panel Recent Labs    10/26/18 1220  CHOL 159  HDL 40*  LDLCALC 104*  TRIG 75  CHOLHDL 4.0   Thyroid Function Tests No results for input(s): TSH, T4TOTAL, T3FREE, THYROIDAB in the last 72 hours.  Invalid input(s): FREET3  Other results:   Imaging  [place] Ekg Site Rite  Result Date: 10/27/2018 If Site Rite image not attached, placement could not be confirmed due to current cardiac rhythm.     Medications:     Scheduled Medications: . aspirin EC  81 mg Oral Daily  . carvedilol  3.125 mg Oral BID WC  . enoxaparin (LOVENOX) injection  80 mg Subcutaneous Daily  . [START ON 10/31/2018] ferrous gluconate  324 mg Oral Q breakfast  . insulin aspart  0-9 Units Subcutaneous TID WC  . insulin detemir  18 Units Subcutaneous QHS  . ipratropium-albuterol  3 mL Nebulization TID  . isosorbide-hydrALAZINE  0.5 tablet Oral TID  . mometasone-formoterol  2 puff Inhalation BID  . pantoprazole  40 mg Oral Daily  . polyethylene glycol  17 g Oral Q M,W,F  . rosuvastatin  40 mg Oral q1800  . senna-docusate  1 tablet Oral  BID  . spironolactone  12.5 mg Oral Daily     Infusions: . ferric gluconate (FERRLECIT/NULECIT) IV    . furosemide (LASIX) infusion       PRN Medications:  acetaminophen **OR** acetaminophen, albuterol, nitroGLYCERIN   Assessment/Plan   1. Acute on chronic systolic CHF: Suspect ischemic cardiomyopathy.  Echo in 1/20 with EF 40-45%, RV moderately dilated, PASP 71 mmHg.  Echo this admission with EF 30-35%, mildly dilated RV.  She appears to have anasarca.  JVP is difficult but suspect elevated.  Abdominal distention suggests possibility of ascites as well.  Suspect significant RV failure, may be related to OHS/OSA and COPD. Picture is complicated by cardiorenal syndrome.  Creatinine fairly stable.  Some diuresis yesterday with weight down, but I/Os not fully recorded.  Think she has a ways to go.  - Will place PICC today to follow CVP and co-ox.  Will use milrinone if co-ox is low.  - Stop Lasix boluses and start infusion at 12 mg/hr (she had a 60 mg IV bolus this morning).  - Continue low dose Coreg 3.125 mg bid.  - Continue Bidil 1/2 tab tid.  - Will add spironolactone 12.5 daily.  - Unna boots for lower legs.  2. AKI: Baseline creatinine around 1.3, has been 1.8-1.9 this admission, 1.79 today.  Suspect cardiorenal syndrome.  Hopefully can see some improvement in creatinine as we lower renal venous pressure. Continue to diurese aggressively for now as above.  3. COPD: On home oxygen at baseline.  Has quit smoking.  4. OHS/OSA: On home oxygen at all times and Bipap at night.  5. CAD: History of PCI, most recently had inferior STEMI in 5/15 with DES to RCA.  No chest pain, troponin not elevated.  - Continue ASA 81 and Crestor 40 daily.  - With fall in EF, will consider RHC/LHC after diuresis if we can stabilize her creatinine.  6. Remote PE: On Lovenox for DVT prophylaxis.  7. Anemia: History of GI bleeding, had endoscopies earlier this year with no bleeding source found.  Also with  history of B12 deficiency. Transferrin saturation low, getting IV Fe.    Length of Stay: 2  Loralie Champagne, MD  10/27/2018, 9:01 AM  Advanced Heart Failure Team Pager [phone] (M-F; 7a - 4p)  Please contact [facility] for night-coverage after hours (4p -7a ) and weekends on [email or web address]

## 2018-10-28 ENCOUNTER — Inpatient Hospital Stay (HOSPITAL_COMMUNITY): Payer: Medicaid Other | Admitting: Certified Registered Nurse Anesthetist

## 2018-10-28 ENCOUNTER — Inpatient Hospital Stay (HOSPITAL_COMMUNITY): Payer: Medicaid Other

## 2018-10-28 DIAGNOSIS — R569 Unspecified convulsions: Secondary | ICD-10-CM

## 2018-10-28 DIAGNOSIS — I469 Cardiac arrest, cause unspecified: Secondary | ICD-10-CM | POA: Diagnosis not present

## 2018-10-28 LAB — LACTIC ACID, PLASMA
Lactic Acid, Venous: 1.3 mmol/L (ref 0.5–1.9)
Lactic Acid, Venous: 0.9 mmol/L (ref 0.5–1.9)
Lactic Acid, Venous: 0.9 mmol/L (ref 0.5–1.9)

## 2018-10-28 LAB — BASIC METABOLIC PANEL
Anion gap: 11 (ref 5–15)
BUN: 58 mg/dL — ABNORMAL HIGH (ref 6–20)
CO2: 31 mmol/L (ref 22–32)
Calcium: 9.2 mg/dL (ref 8.9–10.3)
Chloride: 101 mmol/L (ref 98–111)
Creatinine, Ser: 1.54 mg/dL — ABNORMAL HIGH (ref 0.44–1.00)
GFR calc Af Amer: 43 mL/min — ABNORMAL LOW (ref >60)
GFR calc non Af Amer: 37 mL/min — ABNORMAL LOW (ref >60)
Glucose, Bld: 170 mg/dL — ABNORMAL HIGH (ref 70–99)
Potassium: 3.9 mmol/L (ref 3.5–5.1)
Sodium: 143 mmol/L (ref 135–145)

## 2018-10-28 LAB — BLOOD GAS, ARTERIAL
Acid-Base Excess: 5.7 mmol/L — ABNORMAL HIGH (ref 0.0–2.0)
Bicarbonate: 33 mmol/L — ABNORMAL HIGH (ref 20.0–28.0)
Drawn by: 280981
O2 Content: 5 L/min
O2 Saturation: 94.6 %
Patient temperature: 98.6
pCO2 arterial: 83.5 mmHg (ref 32.0–48.0)
pH, Arterial: 7.221 — ABNORMAL LOW (ref 7.350–7.450)
pO2, Arterial: 93.1 mmHg (ref 83.0–108.0)

## 2018-10-28 LAB — COMPREHENSIVE METABOLIC PANEL
ALT: 18 U/L (ref 0–44)
AST: 25 U/L (ref 15–41)
Albumin: 3 g/dL — ABNORMAL LOW (ref 3.5–5.0)
Alkaline Phosphatase: 112 U/L (ref 38–126)
Anion gap: 8 (ref 5–15)
BUN: 59 mg/dL — ABNORMAL HIGH (ref 6–20)
CO2: 34 mmol/L — ABNORMAL HIGH (ref 22–32)
Calcium: 8.8 mg/dL — ABNORMAL LOW (ref 8.9–10.3)
Chloride: 103 mmol/L (ref 98–111)
Creatinine, Ser: 1.74 mg/dL — ABNORMAL HIGH (ref 0.44–1.00)
GFR calc Af Amer: 37 mL/min — ABNORMAL LOW (ref >60)
GFR calc non Af Amer: 32 mL/min — ABNORMAL LOW (ref >60)
Glucose, Bld: 167 mg/dL — ABNORMAL HIGH (ref 70–99)
Potassium: 4 mmol/L (ref 3.5–5.1)
Sodium: 145 mmol/L (ref 135–145)
Total Bilirubin: 1.1 mg/dL (ref 0.3–1.2)
Total Protein: 9.1 g/dL — ABNORMAL HIGH (ref 6.5–8.1)

## 2018-10-28 LAB — URINALYSIS, ROUTINE W REFLEX MICROSCOPIC
Bilirubin Urine: NEGATIVE
Glucose, UA: NEGATIVE mg/dL
Hgb urine dipstick: NEGATIVE
Ketones, ur: NEGATIVE mg/dL
Leukocytes,Ua: NEGATIVE
Nitrite: NEGATIVE
Protein, ur: NEGATIVE mg/dL
Specific Gravity, Urine: 1.008 (ref 1.005–1.030)
pH: 5 (ref 5.0–8.0)

## 2018-10-28 LAB — POCT I-STAT 7, (LYTES, BLD GAS, ICA,H+H)
Acid-Base Excess: 6 mmol/L — ABNORMAL HIGH (ref 0.0–2.0)
Acid-Base Excess: 8 mmol/L — ABNORMAL HIGH (ref 0.0–2.0)
Bicarbonate: 36.9 mmol/L — ABNORMAL HIGH (ref 20.0–28.0)
Bicarbonate: 35.7 mmol/L — ABNORMAL HIGH (ref 20.0–28.0)
Calcium, Ion: 1.25 mmol/L (ref 1.15–1.40)
Calcium, Ion: 1.23 mmol/L (ref 1.15–1.40)
HCT: 33 % — ABNORMAL LOW (ref 36.0–46.0)
HCT: 31 % — ABNORMAL LOW (ref 36.0–46.0)
Hemoglobin: 11.2 g/dL — ABNORMAL LOW (ref 12.0–15.0)
Hemoglobin: 10.5 g/dL — ABNORMAL LOW (ref 12.0–15.0)
O2 Saturation: 99 %
O2 Saturation: 99 %
Patient temperature: 35.5
Patient temperature: 35.7
Potassium: 4.1 mmol/L (ref 3.5–5.1)
Potassium: 4 mmol/L (ref 3.5–5.1)
Sodium: 146 mmol/L — ABNORMAL HIGH (ref 135–145)
Sodium: 146 mmol/L — ABNORMAL HIGH (ref 135–145)
TCO2: 40 mmol/L — ABNORMAL HIGH (ref 22–32)
TCO2: 38 mmol/L — ABNORMAL HIGH (ref 22–32)
pCO2 arterial: 88.2 mmHg (ref 32.0–48.0)
pCO2 arterial: 61.5 mmHg — ABNORMAL HIGH (ref 32.0–48.0)
pH, Arterial: 7.222 — ABNORMAL LOW (ref 7.350–7.450)
pH, Arterial: 7.366 (ref 7.350–7.450)
pO2, Arterial: 205 mmHg — ABNORMAL HIGH (ref 83.0–108.0)
pO2, Arterial: 122 mmHg — ABNORMAL HIGH (ref 83.0–108.0)

## 2018-10-28 LAB — GLUCOSE, CAPILLARY
Glucose-Capillary: 107 mg/dL — ABNORMAL HIGH (ref 70–99)
Glucose-Capillary: 110 mg/dL — ABNORMAL HIGH (ref 70–99)
Glucose-Capillary: 113 mg/dL — ABNORMAL HIGH (ref 70–99)
Glucose-Capillary: 127 mg/dL — ABNORMAL HIGH (ref 70–99)
Glucose-Capillary: 140 mg/dL — ABNORMAL HIGH (ref 70–99)
Glucose-Capillary: 141 mg/dL — ABNORMAL HIGH (ref 70–99)
Glucose-Capillary: 149 mg/dL — ABNORMAL HIGH (ref 70–99)

## 2018-10-28 LAB — MAGNESIUM
Magnesium: 2.4 mg/dL (ref 1.7–2.4)
Magnesium: 2.4 mg/dL (ref 1.7–2.4)

## 2018-10-28 LAB — CBC WITH DIFFERENTIAL/PLATELET
Abs Immature Granulocytes: 0.16 10*3/uL — ABNORMAL HIGH (ref 0.00–0.07)
Basophils Absolute: 0 10*3/uL (ref 0.0–0.1)
Basophils Relative: 0 %
Eosinophils Absolute: 0.1 10*3/uL (ref 0.0–0.5)
Eosinophils Relative: 1 %
HCT: 33.3 % — ABNORMAL LOW (ref 36.0–46.0)
Hemoglobin: 9.9 g/dL — ABNORMAL LOW (ref 12.0–15.0)
Immature Granulocytes: 2 %
Lymphocytes Relative: 8 %
Lymphs Abs: 0.8 10*3/uL (ref 0.7–4.0)
MCH: 26.3 pg (ref 26.0–34.0)
MCHC: 29.7 g/dL — ABNORMAL LOW (ref 30.0–36.0)
MCV: 88.6 fL (ref 80.0–100.0)
Monocytes Absolute: 0.8 10*3/uL (ref 0.1–1.0)
Monocytes Relative: 8 %
Neutro Abs: 7.5 10*3/uL (ref 1.7–7.7)
Neutrophils Relative %: 81 %
Platelets: 209 10*3/uL (ref 150–400)
RBC: 3.76 MIL/uL — ABNORMAL LOW (ref 3.87–5.11)
RDW: 17.2 % — ABNORMAL HIGH (ref 11.5–15.5)
WBC: 9.3 10*3/uL (ref 4.0–10.5)
nRBC: 0.3 % — ABNORMAL HIGH (ref 0.0–0.2)

## 2018-10-28 LAB — MRSA PCR SCREENING: MRSA by PCR: NEGATIVE

## 2018-10-28 LAB — COOXEMETRY PANEL
Carboxyhemoglobin: 1.4 % (ref 0.5–1.5)
Methemoglobin: 1.8 % — ABNORMAL HIGH (ref 0.0–1.5)
O2 Saturation: 77.4 %
Total hemoglobin: 10.3 g/dL — ABNORMAL LOW (ref 12.0–16.0)

## 2018-10-28 LAB — PHOSPHORUS: Phosphorus: 4.6 mg/dL (ref 2.5–4.6)

## 2018-10-28 LAB — RAPID URINE DRUG SCREEN, HOSP PERFORMED
Amphetamines: NOT DETECTED
Barbiturates: NOT DETECTED
Benzodiazepines: NOT DETECTED
Cocaine: NOT DETECTED
Opiates: NOT DETECTED
Tetrahydrocannabinol: NOT DETECTED

## 2018-10-28 LAB — TSH: TSH: 5.177 u[IU]/mL — ABNORMAL HIGH (ref 0.350–4.500)

## 2018-10-28 LAB — BRAIN NATRIURETIC PEPTIDE: B Natriuretic Peptide: 1423.5 pg/mL — ABNORMAL HIGH (ref 0.0–100.0)

## 2018-10-28 LAB — TROPONIN I
Troponin I: 0.03 ng/mL (ref <0.03)
Troponin I: 0.1 ng/mL (ref <0.03)
Troponin I: 0.1 ng/mL (ref <0.03)

## 2018-10-28 LAB — AMMONIA: Ammonia: 63 umol/L — ABNORMAL HIGH (ref 9–35)

## 2018-10-28 MED ORDER — INSULIN DETEMIR 100 UNIT/ML ~~LOC~~ SOLN
8.0000 [IU] | Freq: Every day | SUBCUTANEOUS | Status: DC
  Administered 2018-10-28 – 2018-11-05 (×9): 8 [IU] via SUBCUTANEOUS
  Filled 2018-10-28 (×11): qty 0.08

## 2018-10-28 MED ORDER — CHLORHEXIDINE GLUCONATE CLOTH 2 % EX PADS
6.0000 | MEDICATED_PAD | Freq: Every day | CUTANEOUS | Status: DC
  Administered 2018-10-28 – 2018-10-30 (×3): 6 via TOPICAL

## 2018-10-28 MED ORDER — POTASSIUM CHLORIDE 20 MEQ PO PACK
20.0000 meq | PACK | Freq: Once | ORAL | Status: DC
  Filled 2018-10-28: qty 1

## 2018-10-28 MED ORDER — POTASSIUM CHLORIDE 20 MEQ PO PACK
20.0000 meq | PACK | Freq: Once | ORAL | Status: AC
  Administered 2018-10-28: 20 meq
  Filled 2018-10-28: qty 1

## 2018-10-28 MED ORDER — PANTOPRAZOLE SODIUM 40 MG PO PACK
40.0000 mg | PACK | Freq: Every day | ORAL | Status: DC
  Administered 2018-10-28 – 2018-10-30 (×3): 40 mg
  Filled 2018-10-28 (×3): qty 20

## 2018-10-28 MED ORDER — VITAL HIGH PROTEIN PO LIQD: 1000.0000 mL | ORAL | Status: DC

## 2018-10-28 MED ORDER — SUCCINYLCHOLINE CHLORIDE 20 MG/ML IJ SOLN
INTRAMUSCULAR | Status: DC | PRN
  Administered 2018-10-28: 120 mg via INTRAVENOUS

## 2018-10-28 MED ORDER — PROPOFOL 1000 MG/100ML IV EMUL
25.0000 ug/kg/min | INTRAVENOUS | Status: DC
  Administered 2018-10-28: 25 ug/kg/min via INTRAVENOUS
  Administered 2018-10-28: 20 ug/kg/min via INTRAVENOUS
  Administered 2018-10-29: 15 ug/kg/min via INTRAVENOUS
  Administered 2018-10-29: 10 ug/kg/min via INTRAVENOUS
  Administered 2018-10-30: 20 ug/kg/min via INTRAVENOUS
  Filled 2018-10-28 (×6): qty 100

## 2018-10-28 MED ORDER — SENNOSIDES-DOCUSATE SODIUM 8.6-50 MG PO TABS
1.0000 | ORAL_TABLET | Freq: Two times a day (BID) | ORAL | Status: DC
  Administered 2018-10-28 – 2018-10-30 (×4): 1
  Filled 2018-10-28 (×4): qty 1

## 2018-10-28 MED ORDER — VITAL AF 1.2 CAL PO LIQD
1000.0000 mL | ORAL | Status: DC
  Administered 2018-10-28: 1000 mL

## 2018-10-28 MED ORDER — CISATRACURIUM BOLUS VIA INFUSION
0.1000 mg/kg | Freq: Once | INTRAVENOUS | Status: DC
  Filled 2018-10-28: qty 15

## 2018-10-28 MED ORDER — FENTANYL CITRATE (PF) 100 MCG/2ML IJ SOLN: 50.0000 ug | INTRAMUSCULAR | Status: DC | PRN

## 2018-10-28 MED ORDER — LORAZEPAM 2 MG/ML IJ SOLN
2.0000 mg | Freq: Once | INTRAMUSCULAR | Status: AC
  Administered 2018-10-28: 2 mg via INTRAVENOUS

## 2018-10-28 MED ORDER — LORAZEPAM 2 MG/ML IJ SOLN
INTRAMUSCULAR | Status: AC
  Filled 2018-10-28: qty 1

## 2018-10-28 MED ORDER — MIDAZOLAM HCL 2 MG/2ML IJ SOLN
2.0000 mg | INTRAMUSCULAR | Status: DC | PRN
  Administered 2018-10-28: 2 mg via INTRAVENOUS
  Filled 2018-10-28: qty 2

## 2018-10-28 MED ORDER — MIDAZOLAM HCL 2 MG/2ML IJ SOLN
INTRAMUSCULAR | Status: AC
  Filled 2018-10-28: qty 2

## 2018-10-28 MED ORDER — FENTANYL 2500MCG IN NS 250ML (10MCG/ML) PREMIX INFUSION
50.0000 ug/h | INTRAVENOUS | Status: DC
  Administered 2018-10-28: 100 ug/h via INTRAVENOUS
  Filled 2018-10-28 (×3): qty 250

## 2018-10-28 MED ORDER — MIDAZOLAM HCL 2 MG/2ML IJ SOLN: 2.0000 mg | INTRAMUSCULAR | Status: DC | PRN

## 2018-10-28 MED ORDER — FENTANYL BOLUS VIA INFUSION
50.0000 ug | INTRAVENOUS | Status: DC | PRN
  Filled 2018-10-28: qty 50

## 2018-10-28 MED ORDER — CHLORHEXIDINE GLUCONATE 0.12% ORAL RINSE (MEDLINE KIT)
15.0000 mL | Freq: Two times a day (BID) | OROMUCOSAL | Status: DC
  Administered 2018-10-28 – 2018-10-31 (×6): 15 mL via OROMUCOSAL

## 2018-10-28 MED ORDER — CISATRACURIUM BESYLATE (PF) 10 MG/5ML IV SOLN
0.1000 mg/kg | INTRAVENOUS | Status: DC | PRN
  Filled 2018-10-28: qty 7.4

## 2018-10-28 MED ORDER — FENTANYL CITRATE (PF) 100 MCG/2ML IJ SOLN
50.0000 ug | INTRAMUSCULAR | Status: AC | PRN
  Administered 2018-10-28 – 2018-10-29 (×3): 50 ug via INTRAVENOUS
  Filled 2018-10-28 (×2): qty 2

## 2018-10-28 MED ORDER — FENTANYL CITRATE (PF) 100 MCG/2ML IJ SOLN
50.0000 ug | Freq: Once | INTRAMUSCULAR | Status: DC
  Filled 2018-10-28: qty 2

## 2018-10-28 MED ORDER — ASPIRIN 81 MG PO CHEW
81.0000 mg | CHEWABLE_TABLET | Freq: Every day | ORAL | Status: DC
  Filled 2018-10-28: qty 1

## 2018-10-28 MED ORDER — METOLAZONE 5 MG PO TABS
5.0000 mg | ORAL_TABLET | Freq: Once | ORAL | Status: AC
  Administered 2018-10-28: 5 mg via ORAL
  Filled 2018-10-28: qty 1

## 2018-10-28 MED ORDER — SPIRONOLACTONE 12.5 MG HALF TABLET
12.5000 mg | ORAL_TABLET | Freq: Every day | ORAL | Status: DC
  Administered 2018-10-29 – 2018-10-30 (×2): 12.5 mg
  Filled 2018-10-28 (×3): qty 1

## 2018-10-28 MED ORDER — PRO-STAT SUGAR FREE PO LIQD
60.0000 mL | Freq: Two times a day (BID) | ORAL | Status: DC
  Administered 2018-10-28 – 2018-10-30 (×4): 60 mL
  Filled 2018-10-28 (×4): qty 60

## 2018-10-28 MED ORDER — ORAL CARE MOUTH RINSE
15.0000 mL | OROMUCOSAL | Status: DC
  Administered 2018-10-28 – 2018-10-30 (×19): 15 mL via OROMUCOSAL

## 2018-10-28 MED ORDER — ROSUVASTATIN CALCIUM 20 MG PO TABS
40.0000 mg | ORAL_TABLET | Freq: Every day | ORAL | Status: DC
  Administered 2018-10-29 – 2018-10-30 (×2): 40 mg
  Filled 2018-10-28 (×2): qty 2

## 2018-10-28 MED ORDER — ARTIFICIAL TEARS OPHTHALMIC OINT: 1.0000 "application " | TOPICAL_OINTMENT | Freq: Three times a day (TID) | OPHTHALMIC | Status: DC

## 2018-10-28 MED ORDER — SODIUM CHLORIDE 0.9 % IV SOLN
0.0000 ug/kg/min | INTRAVENOUS | Status: DC
  Filled 2018-10-28: qty 20

## 2018-10-28 MED ORDER — INSULIN ASPART 100 UNIT/ML ~~LOC~~ SOLN
3.0000 [IU] | SUBCUTANEOUS | Status: DC
  Administered 2018-10-28: 3 [IU] via SUBCUTANEOUS
  Administered 2018-10-29 (×4): 6 [IU] via SUBCUTANEOUS
  Administered 2018-10-29: 3 [IU] via SUBCUTANEOUS
  Administered 2018-10-29 – 2018-10-30 (×3): 6 [IU] via SUBCUTANEOUS
  Administered 2018-10-30 (×2): 9 [IU] via SUBCUTANEOUS
  Administered 2018-10-31 (×2): 3 [IU] via SUBCUTANEOUS

## 2018-10-28 MED ORDER — POLYETHYLENE GLYCOL 3350 17 G PO PACK
17.0000 g | PACK | ORAL | Status: DC
  Administered 2018-10-30 – 2018-11-06 (×3): 17 g
  Filled 2018-10-28 (×5): qty 1

## 2018-10-28 MED ORDER — ISOSORB DINITRATE-HYDRALAZINE 20-37.5 MG PO TABS
0.5000 | ORAL_TABLET | Freq: Three times a day (TID) | ORAL | Status: DC
  Administered 2018-10-28 – 2018-11-07 (×29): 0.5
  Filled 2018-10-28 (×2): qty 1
  Filled 2018-10-28: qty 0.5
  Filled 2018-10-28 (×2): qty 1
  Filled 2018-10-28: qty 0.5
  Filled 2018-10-28: qty 1
  Filled 2018-10-28: qty 0.5
  Filled 2018-10-28 (×4): qty 1
  Filled 2018-10-28: qty 0.5
  Filled 2018-10-28: qty 1
  Filled 2018-10-28: qty 0.5
  Filled 2018-10-28 (×3): qty 1
  Filled 2018-10-28 (×2): qty 0.5
  Filled 2018-10-28: qty 1
  Filled 2018-10-28: qty 0.5
  Filled 2018-10-28: qty 1
  Filled 2018-10-28 (×2): qty 0.5
  Filled 2018-10-28 (×7): qty 1

## 2018-10-28 NOTE — Procedures (Signed)
Arterial Catheter Insertion Procedure Note Tamara Silva [id number] 03/24/1961  Procedure: Insertion of Arterial Catheter  Indications: Blood pressure monitoring and Frequent blood sampling  Procedure Details Consent: Unable to obtain consent because of emergent medical necessity. Time Out: Verified patient identification, verified procedure, site/side was marked, verified correct patient position, special equipment/implants available, medications/allergies/relevent history reviewed, required imaging and test results available.  Performed  Maximum sterile technique was used including antiseptics, cap, gloves, gown, hand hygiene, mask and sheet. Skin prep: Chlorhexidine; local anesthetic administered 20 gauge catheter was inserted into left radial artery using the Seldinger technique. ULTRASOUND GUIDANCE USED: YES Evaluation Blood flow good; BP tracing good. Complications: No apparent complications.   Tamara Silva M 10/28/2018

## 2018-10-28 NOTE — Progress Notes (Signed)
Patient transported on vent from 4N18 to 4N15 due to monitor issues.  No complications.

## 2018-10-28 NOTE — Progress Notes (Signed)
ABG results reported to MD.  Will place pt on Bipap per MD order.

## 2018-10-28 NOTE — Procedures (Signed)
Tamara Lakes A. Merlene Laughter, MD     [email or web address]           HISTORY: This is a 58 year old who presents with seizures and cardiac arrest.  There is a prior history of stroke.  MEDICATIONS:  Current Facility-Administered Medications:  .  0.9 %  sodium chloride infusion, , Intravenous, PRN, Amin, Ankit Chirag, MD, Last Rate: 10 mL/hr at 10/28/18 0400 .  acetaminophen (TYLENOL) tablet 650 mg, 650 mg, Oral, Q6H PRN **OR** acetaminophen (TYLENOL) suppository 650 mg, 650 mg, Rectal, Q6H PRN, Rise Patience, MD .  albuterol (PROVENTIL) (2.5 MG/3ML) 0.083% nebulizer solution 2.5 mg, 2.5 mg, Nebulization, Q2H PRN, Rise Patience, MD, 2.5 mg at 10/28/18 1059 .  aspirin chewable tablet 81 mg, 81 mg, Oral, Daily, Clydell Hakim, MD .  chlorhexidine gluconate (MEDLINE KIT) (PERIDEX) 0.12 % solution 15 mL, 15 mL, Mouth Rinse, BID, Amin, Ankit Chirag, MD, 15 mL at 10/28/18 1957 .  Chlorhexidine Gluconate Cloth 2 % PADS 6 each, 6 each, Topical, Daily, Amin, Jeanella Flattery, MD, 6 each at 10/28/18 1958 .  enoxaparin (LOVENOX) injection 80 mg, 80 mg, Subcutaneous, Daily, Rise Patience, MD, 80 mg at 10/28/18 1653 .  feeding supplement (PRO-STAT SUGAR FREE 64) liquid 60 mL, 60 mL, Per Tube, BID, Amin, Ankit Chirag, MD .  feeding supplement (VITAL AF 1.2 CAL) liquid 1,000 mL, 1,000 mL, Per Tube, Continuous, Amin, Ankit Chirag, MD .  fentaNYL (SUBLIMAZE) bolus via infusion 50 mcg, 50 mcg, Intravenous, Q15 min PRN, Clydell Hakim, MD .  fentaNYL (SUBLIMAZE) injection 50 mcg, 50 mcg, Intravenous, Q15 min PRN, Parrett, Tammy S, NP, 50 mcg at 10/28/18 1133 .  fentaNYL (SUBLIMAZE) injection 50 mcg, 50 mcg, Intravenous, Once, Clydell Hakim, MD .  fentaNYL (SUBLIMAZE) injection 50-200 mcg, 50-200 mcg, Intravenous, Q30 min PRN, Parrett, Tammy S, NP .  fentaNYL 2545mg in NS 2514m(1060mml) infusion-PREMIX, 50-300 mcg/hr, Intravenous, Continuous, HarClydell HakimD, Last Rate: 10 mL/hr  at 10/28/18 1303, 100 mcg/hr at 10/28/18 1303 .  ferric gluconate (NULECIT) 125 mg in sodium chloride 0.9 % 100 mL IVPB, 125 mg, Intravenous, Daily, Amin, Ankit Chirag, MD, Last Rate: 110 mL/hr at 10/28/18 1847, 125 mg at 10/28/18 1847 .  [START ON 10/31/2018] ferrous gluconate (FERGON) tablet 324 mg, 324 mg, Oral, Q breakfast, Amin, Ankit Chirag, MD, 324 mg at 10/27/18 0921 .  furosemide (LASIX) 250 mg in dextrose 5 % 250 mL (1 mg/mL) infusion, 30 mg/hr, Intravenous, Continuous, Amin, Ankit Chirag, MD, Last Rate: 30 mL/hr at 10/28/18 1642, 30 mg/hr at 10/28/18 1642 .  insulin aspart (novoLOG) injection 3-9 Units, 3-9 Units, Subcutaneous, Q4H, Parrett, Tammy S, NP .  insulin detemir (LEVEMIR) injection 8 Units, 8 Units, Subcutaneous, QHS, Parrett, Tammy S, NP .  ipratropium-albuterol (DUONEB) 0.5-2.5 (3) MG/3ML nebulizer solution 3 mL, 3 mL, Nebulization, TID, Amin, Ankit Chirag, MD, 3 mL at 10/28/18 1434 .  isosorbide-hydrALAZINE (BIDIL) 20-37.5 MG per tablet 0.5 tablet, 0.5 tablet, Per Tube, TID, HarClydell HakimD .  MEDLINE mouth rinse, 15 mL, Mouth Rinse, 10 times per day, Amin, Ankit Chirag, MD .  midazolam (VERSED) injection 2 mg, 2 mg, Intravenous, Q15 min PRN, Parrett, Tammy S, NP, 2 mg at 10/28/18 1131 .  midazolam (VERSED) injection 2 mg, 2 mg, Intravenous, Q2H PRN, Parrett, Tammy S, NP .  nitroGLYCERIN (NITROSTAT) SL tablet 0.4 mg, 0.4 mg, Sublingual, Q5 min PRN, KakRise PatienceD .  pantoprazole sodium (PROTONIX) 40 mg/20 mL oral suspension 40 mg, 40  mg, Per Tube, Daily, Clydell Hakim, MD, 40 mg at 10/28/18 1653 .  [START ON 10/30/2018] polyethylene glycol (MIRALAX / GLYCOLAX) packet 17 g, 17 g, Per Tube, Q M,W,F, Clydell Hakim, MD .  potassium chloride (KLOR-CON) packet 20 mEq, 20 mEq, Per Tube, Once, Clydell Hakim, MD .  propofol (DIPRIVAN) 1000 MG/100ML infusion, 25-80 mcg/kg/min, Intravenous, Continuous, Clydell Hakim, MD, Last Rate: 22.2 mL/hr at 10/28/18 1953, 25 mcg/kg/min at  10/28/18 1953 .  [START ON 10/29/2018] rosuvastatin (CRESTOR) tablet 40 mg, 40 mg, Per Tube, q1800, Clydell Hakim, MD .  senna-docusate (Senokot-S) tablet 1 tablet, 1 tablet, Per Tube, BID, Clydell Hakim, MD .  sodium chloride flush (NS) 0.9 % injection 10-40 mL, 10-40 mL, Intracatheter, PRN, Damita Lack, MD .  Derrill Memo ON 10/29/2018] spironolactone (ALDACTONE) tablet 12.5 mg, 12.5 mg, Per Tube, Daily, Clydell Hakim, MD     ANALYSIS: A 16 channel recording using standard 10 20 measurements is conducted for 23 minutes.  The background activity posteriorly is that of mostly theta range with a high of 6 Hz.  There is intermixed with this a low voltage high amplitude 20 Hz activity.  There is the typical frontal beta activity observed with myogenic interference from time to time.  Photic stimulation and hyperventilation are not conducted.  There is no focal or lateral slowing.  There is no epileptiform activities noted.   IMPRESSION: 1.  This recording shows moderate global slowing indicating a moderate global encephalopathy.  However no epileptiform discharges are noted.      Minard Millirons A. Merlene Silva, M.D.  Diplomate, Tax adviser of Psychiatry and Neurology ( Neurology).

## 2018-10-28 NOTE — Progress Notes (Signed)
Patient was not trasnfered into system upon arrival.  Once stabilized RN noticed pt had not been admitted to system.  RN then added patient.  All vitals prior to adding her to system were lost.  Will continue to monitor patient.

## 2018-10-28 NOTE — Progress Notes (Addendum)
PROGRESS NOTE    Tamara Silva  RJJ:[id number] DOB: 13-Aug-1960 DOA: 10/25/2018 PCP: Antony Blackbird, MD   Brief Narrative:  58 year old with a history of systolic CHF ejection fraction 40-45%- January 2020, obesity hypoventilation on BiPAP at bedtime, diabetes mellitus type 2, CKD, CAD, recent CVA requiring intubation for seizure presented for increasing shortness of breath and peripheral edema.  Chest x-ray overall unremarkable.  BNP slightly elevated, creatinine elevated from baseline of 1.3 to1.8.  Started on IV diuretics.  Also some component of COPD exacerbation. Cardiology consulted give her adv cardiac issues. Due to signficant amount of overload, PICC was placed to monitor CVP and CoOx, started on Lasix drip.  On 5/30 morning patient suddenly became unresponsive and concerns for seizure which lasted for more than 20 minutes.  She was given 2 mg of IV Ativan and CT of the head was ordered.  While she was getting CT of the head, patient became hypoxic leading to PEA cardiac arrest.  1 round of CPR was performed with regain of pulse but soon after went back again cardiac arrest requiring another round of CPR.  She was transferred to ICU after being intubated.  Neurology and critical care team were consulted.   Assessment & Plan:   Principal Problem:   Cardiac arrest [facility]) Active Problems:   Morbid obesity ([place])   CAD S/P percutaneous coronary angioplasty - prior PCI to LAD; RCA PCI: new Xience Alpine DES 2.75 mm x 15 mm    COPD exacerbation (HCC)   CKD (chronic kidney disease), stage II   Acute respiratory failure with hypoxia (HCC)   Uncontrolled type 2 diabetes mellitus without complication, with long-term current use of insulin (HCC)   OSA (obstructive sleep apnea)   Tobacco abuse disorder   Hypertension   Iron deficiency anemia   Diabetes mellitus type 2 in obese (HCC)   Acute on chronic diastolic CHF (congestive heart failure) (HCC)   Slow transit constipation   Seizure  ([place])  Unresponsive concerning for acute seizure Acute respiratory failure hypoxic and hypercarbic requiring intubation Cardiac arrest, likely PEA arrest with spontaneous return of circulation -Transfer the patient to the ICU for closer monitoring.  Currently patient is intubated and on ventilator.  Management per critical care team.  Neurology team has been consulted, patient will require EEG of the head, CT of the head once medically stable.  Ventilator management per critical care team. UDS ordered. Unsure of the exact etiology of her seizure. No prescription drug or alcohol withdrawal syspected.  -Blood glucose was around 140s.  ABG prior to intubation showed pH of 7.22, PCO2 of 88 saturating 99% with PaO2 more than 200 on 5 L nasal cannula  Acute combined diastolic and systolic congestive heart failure with ejection fraction 01% grade 2 diastolic dysfunction, class III -CVP remains elevated.  She currently is on IV Lasix drip.  Heart failure team is following. -Bronchodilator treatment, hold off on steroid use. -Cardiology added Bidil and Aldactone.  -Monitor electrolytes -Echo in January 2020-ejection fraction 40-45% with grade 2 diastolic dysfunction  Acute kidney injury on CKD stage III; suspect Cardiorenal.  -Baseline creatinine 1.3, creatinine elevated at 1.74.  Closely monitor this while she is getting diuresed.  Constipation -Aggressive bowel regimen  Diabetes mellitus type 2 -On insulin sliding scale, Levemir 18 units daily.  NovoLog 2 units pre-meals  Obesity hypoventilation syndrome on BiPAP at bedtime -Supportive care.  Advised to lose weight.  Would benefit from outpatient follow-up with bariatric surgery. On BiPAP at nighttime.  Anemia of chronic disease -Saturation ratios quite low, will likely of poor absorption of oral iron therefore will give 3 days of IV iron here before resuming her oral iron.  Today is day 2 of 3  History of CVA complicated by seizure -On  aspirin and statin  History of CAD status post stenting -On aspirin statin and Coreg  Essential hypertension -Continue Coreg  Daily tobacco use -Counseled to quit smoking  DVT prophylaxis: Lovenox Code Status: Full code Family Communication: Spoke with the patient's daughter this morning regarding her condition and transferred to the ICU. Disposition Plan: Transfer the patient to the ICU  Consultants:   Critical care  Neurology  Procedures:   None  Antimicrobials:   None   Subjective: I was informed by the nurse this morning the patient suddenly became unresponsive.  As I arrived at bedside it appeared patient was having seizure-like episode with abnormal eye movement.  She was completely unresponsive to verbal and physical stimuli.  Her oxygen saturation was 100% on 4 L nasal cannula and at the time was protecting her airway.  An attempt to break her seizure she was given 2 mg of IV Ativan and ABG was performed.  PCO2 was elevated at 88 with pH of 7.22.  She was taken for the CT of the head but right before getting the scan she became hypoxic and went into PEA arrest.  CODE BLUE was called and she received 1 round of CPR with return of circulation but soon after she had another episode of PEA cardiac arrest requiring another round of CPR.  Her blood glucose was 149.  She was intubated during this time and transferred to ICU.  She was deemed unstable to get CT of the head.  Critical care team and neurology team was consulted.  Review of Systems Otherwise negative except as per HPI, including: Unable to obtain as patient was unresponsive  Objective: Vitals:   10/28/18 0934 10/28/18 1000 10/28/18 1100 10/28/18 1103  BP:   112/61   Pulse: 77  60 60  Resp: 20   (!) 25  Temp:  (!) 95.9 F (35.5 C) 97.7 F (36.5 C)   TempSrc:  Bladder Bladder   SpO2: 95%  99% 100%  Weight:      Height:        Intake/Output Summary (Last 24 hours) at 10/28/2018 1217 Last data filed at  10/28/2018 1100 Gross per 24 hour  Intake 769.83 ml  Output 2200 ml  Net -1430.17 ml   Filed Weights   10/26/18 0500 10/27/18 0345 10/28/18 0500  Weight: (!) 148.6 kg (!) 141.9 kg (!) 148.3 kg    Examination:  Constitutional: Unresponsive to verbal and physical stimuli on 5 L nasal cannula Eyes: Rhythmic eye movement concerning for seizure-like activity ENMT: Mucous membranes are dry.  Positive gag reflex initially Neck: normal, supple, no masses, no thyromegaly Respiratory: Diffuse diminished bowel sounds Cardiovascular: Regular rate and rhythm, no murmurs / rubs / gallops. No extremity edema. 2+ pedal pulses. No carotid bruits.  Abdomen: no tenderness, no masses palpated. No hepatosplenomegaly. Bowel sounds positive.  Musculoskeletal: no clubbing / cyanosis. No joint deformity upper and lower extremities.  Skin: no rashes, lesions, ulcers. No induration Neurologic: Difficult to assess what appeared she was having seizure-like episode and was completely unresponsive to verbal and physical stimuli maintaining her airway initially. Psychiatric: Difficult to assess  Data Reviewed:   CBC: Recent Labs  Lab 10/25/18 0309 10/25/18 0724 10/26/18 0013 10/28/18 1023 10/28/18  1031  WBC 5.7 5.1 5.7 9.3  --   NEUTROABS 3.1 3.3  --  7.5  --   HGB 10.2* 11.3* 9.8* 9.9* 11.2*  HCT 33.7* 38.1 32.2* 33.3* 33.0*  MCV 88.0 88.0 86.3 88.6  --   PLT 228 197 216 209  --    Basic Metabolic Panel: Recent Labs  Lab 10/25/18 0928 10/26/18 0013 10/27/18 0314 10/28/18 0410 10/28/18 1023 10/28/18 1031  NA 141 141 141 143 145 146*  K 5.4* 4.9 4.2 3.9 4.0 4.1  CL 107 107 105 101 103  --   CO2 21* 27 27 31  34*  --   GLUCOSE 173* 200* 119* 170* 167*  --   BUN 48* 53* 60* 58* 59*  --   CREATININE 1.80* 1.93* 1.79* 1.54* 1.74*  --   CALCIUM 9.2 9.2 9.0 9.2 8.8*  --   MG 2.6* 2.6* 2.5* 2.4 2.4  --    GFR: Estimated Creatinine Clearance: 56.6 mL/min (A) (by C-G formula based on SCr of 1.74  mg/dL (H)). Liver Function Tests: Recent Labs  Lab 10/25/18 0928 10/26/18 1220 10/28/18 1023  AST 28 21 25   ALT 16 16 18   ALKPHOS 140* 124 112  BILITOT 1.2 0.7 1.1  PROT 9.1* 9.0* 9.1*  ALBUMIN 3.1* 3.1* 3.0*   No results for input(s): LIPASE, AMYLASE in the last 168 hours. No results for input(s): AMMONIA in the last 168 hours. Coagulation Profile: No results for input(s): INR, PROTIME in the last 168 hours. Cardiac Enzymes: Recent Labs  Lab 10/25/18 0928 10/25/18 1553 10/26/18 0013 10/28/18 1023  TROPONINI <0.03 <0.03 <0.03 0.03*   BNP (last 3 results) No results for input(s): PROBNP in the last 8760 hours. HbA1C: No results for input(s): HGBA1C in the last 72 hours. CBG: Recent Labs  Lab 10/27/18 2127 10/28/18 0043 10/28/18 0608 10/28/18 0803 10/28/18 1054  GLUCAP 119* 113* 140* 149* 141*   Lipid Profile: Recent Labs    10/26/18 1220  CHOL 159  HDL 40*  LDLCALC 104*  TRIG 75  CHOLHDL 4.0   Thyroid Function Tests: No results for input(s): TSH, T4TOTAL, FREET4, T3FREE, THYROIDAB in the last 72 hours. Anemia Panel: Recent Labs    10/27/18 0314  VITAMINB12 1,453*  FOLATE 27.9  FERRITIN 76  TIBC 371  IRON 34  RETICCTPCT 1.5   Sepsis Labs: Recent Labs  Lab 10/28/18 1023  LATICACIDVEN 1.3    Recent Results (from the past 240 hour(s))  SARS Coronavirus 2 (CEPHEID - Performed in [place] hospital lab), Hosp Order     Status: None   Collection Time: 10/25/18  3:33 AM  Result Value Ref Range Status   SARS Coronavirus 2 NEGATIVE NEGATIVE Final    Comment: (NOTE) If result is NEGATIVE SARS-CoV-2 target nucleic acids are NOT DETECTED. The SARS-CoV-2 RNA is generally detectable in upper and lower  respiratory specimens during the acute phase of infection. The lowest  concentration of SARS-CoV-2 viral copies this assay can detect is 250  copies / mL. A negative result does not preclude SARS-CoV-2 infection  and should not be used as the sole  basis for treatment or other  patient management decisions.  A negative result may occur with  improper specimen collection / handling, submission of specimen other  than nasopharyngeal swab, presence of viral mutation(s) within the  areas targeted by this assay, and inadequate number of viral copies  (<250 copies / mL). A negative result must be combined with clinical  observations, patient  history, and epidemiological information. If result is POSITIVE SARS-CoV-2 target nucleic acids are DETECTED. The SARS-CoV-2 RNA is generally detectable in upper and lower  respiratory specimens dur ing the acute phase of infection.  Positive  results are indicative of active infection with SARS-CoV-2.  Clinical  correlation with patient history and other diagnostic information is  necessary to determine patient infection status.  Positive results do  not rule out bacterial infection or co-infection with other viruses. If result is PRESUMPTIVE POSTIVE SARS-CoV-2 nucleic acids MAY BE PRESENT.   A presumptive positive result was obtained on the submitted specimen  and confirmed on repeat testing.  While 2019 novel coronavirus  (SARS-CoV-2) nucleic acids may be present in the submitted sample  additional confirmatory testing may be necessary for epidemiological  and / or clinical management purposes  to differentiate between  SARS-CoV-2 and other Sarbecovirus currently known to infect humans.  If clinically indicated additional testing with an alternate test  methodology [phone]) is advised. The SARS-CoV-2 RNA is generally  detectable in upper and lower respiratory sp ecimens during the acute  phase of infection. The expected result is Negative. Fact Sheet for Patients:  StrictlyIdeas.no Fact Sheet for Healthcare Providers: BankingDealers.co.za This test is not yet approved or cleared by the [place] FDA and has been authorized for detection  and/or diagnosis of SARS-CoV-2 by FDA under an Emergency Use Authorization (EUA).  This EUA will remain in effect (meaning this test can be used) for the duration of the COVID-19 declaration under Section 564(b)(1) of the Act, 21 U.S.C. section 360bbb-3(b)(1), unless the authorization is terminated or revoked sooner. Performed at [facility], [place] [address], [place],  [address]          Radiology Studies: Dg Abd 1 View  Result Date: 10/28/2018 CLINICAL DATA:  OG tube placement EXAM: ABDOMEN - 1 VIEW COMPARISON:  06/12/2018 FINDINGS: Enteric tube terminates in the proximal gastric body. IMPRESSION: Enteric tube terminates in the proximal gastric body. Electronically Signed   By: Julian Hy M.D.   On: 10/28/2018 10:39   Dg Chest Port 1 View  Result Date: 10/28/2018 CLINICAL DATA:  ETT placement EXAM: PORTABLE CHEST 1 VIEW COMPARISON:  10/25/2018 FINDINGS: Endotracheal tube terminates 4.5 cm above the carina. Multifocal patchy opacities, upper lobe predominant. While this patient has chronic scarring, superimposed right upper lobe pneumonia is not excluded. No definite pleural effusion. Cardiomegaly. Right arm PICC terminates in the lower SVC. IMPRESSION: Endotracheal tube terminates 4.5 cm above the carina. Right arm PICC terminates in the lower SVC. Multifocal patchy opacities, upper lobe predominant, some of which may reflect chronic scarring. Superimposed right upper lobe pneumonia is not excluded. Electronically Signed   By: Julian Hy M.D.   On: 10/28/2018 10:38   [place] Ekg Site Rite  Result Date: 10/27/2018 If Site Rite image not attached, placement could not be confirmed due to current cardiac rhythm.       Scheduled Meds:  artificial tears  1 application Both Eyes Y1V   aspirin EC  81 mg Oral Daily   carvedilol  3.125 mg Oral BID WC   cisatracurium  0.1 mg/kg Intravenous Once   enoxaparin (LOVENOX) injection  80 mg Subcutaneous Daily    fentaNYL (SUBLIMAZE) injection  50 mcg Intravenous Once   [START ON 10/31/2018] ferrous gluconate  324 mg Oral Q breakfast   insulin aspart  3-9 Units Subcutaneous Q4H   insulin detemir  8 Units Subcutaneous QHS   ipratropium-albuterol  3 mL  Nebulization TID   isosorbide-hydrALAZINE  0.5 tablet Oral TID   mometasone-formoterol  2 puff Inhalation BID   pantoprazole sodium  40 mg Per Tube Daily   polyethylene glycol  17 g Oral Q M,W,F   rosuvastatin  40 mg Oral q1800   senna-docusate  1 tablet Oral BID   spironolactone  12.5 mg Oral Daily   Continuous Infusions:  sodium chloride 10 mL/hr at 10/28/18 0400   cisatracurium (NIMBEX) infusion     fentaNYL infusion INTRAVENOUS     ferric gluconate (FERRLECIT/NULECIT) IV Stopped (10/27/18 1401)   furosemide (LASIX) infusion 15 mg/hr (10/28/18 1051)   propofol (DIPRIVAN) infusion       LOS: 3 days   Critical care time spent : 60 minutes examining the patient, discussing with RN, consultants as needed, coordinating care and management.The medical decision making on this patient was of high complexity, the critically ill patient is at high risk for clinical deterioration.  Critical care was necessary to treat or prevent imminent or life-threatening deterioration. Critical care was time spent personally by me on the following activities: development of treatment plan with patient and/or surrogate as well as nursing, discussions with consultants, evaluation of patient's response to treatment, examination of patient, obtaining history from patient or surrogate, ordering and performing treatments and interventions, ordering and review of laboratory studies, ordering and review of radiographic studies, pulse oximetry and re-evaluation of patient's condition.      Tamara Silva Arsenio Loader, MD [facility]  If 7PM-7AM, please contact night-coverage [email or web address] 10/28/2018, 12:17 PM

## 2018-10-28 NOTE — Consult Note (Addendum)
NEUROLOGY CONSULT  Reason for Consult: Neurologic opinion for seizure, sudden AMS followed by PEA arrest  Referring Physician: Dr Truman Hayward  CC: Unable to obtain due to AMS  HPI: Tamara Silva is an 58 y.o. female with morbid obesity and a complex PMH of CAD, HF, chronic anemia, DM2, PE, respiratory failure, Stroke in January. Also with "seizures" as listed in [place], although I see no anticonvulsant on home med list. She has had a complicated course since d/c Jul 11, 2018. She has been followed by HEM for severe anemia, which required blood transfusions; no source of bleeding seen on EGD. She has had worsening HF issues as well as COPD with increased home O2 needs and HS CPAP use. She has also developed worsening CKD 3. On 5/27 she presented to the ER with CP, increased gen edema and cough. COVID19 neg. She was found to be in decompensating HF and admitted for diuresis.   This am during breakfast the RN found her to be acutely obtunded, stat [facility] was ordered and upon arrival to CT, a seizure was witnessed and she went into PEA arrest per bedside nursing report. Neurology consulted for assistance.   Past Medical History Past Medical History:  Diagnosis Date  . Asthma   . CAD (coronary artery disease)   . Chronic combined systolic (congestive) and diastolic (congestive) heart failure ([place])   . Chronic iron deficiency anemia   . CVA (cerebral vascular accident) ([place]) 05/2018  . Diabetes mellitus without complication ([place])   . DOE (dyspnea on exertion)   . Edema of both legs   . Hepatic steatosis   . Hypertension   . Pulmonary emboli ([place]) 2014  . Respiratory failure ([place]) 09/2018  . Seizure ([place]) 05/2018    Past Surgical History Past Surgical History:  Procedure Laterality Date  . BIOPSY  07/10/2018   Procedure: BIOPSY;  Surgeon: [name], MD;  Location: [facility];  Service: Gastroenterology;;  . COLONOSCOPY WITH PROPOFOL N/A 08/03/2013   Procedure: COLONOSCOPY WITH PROPOFOL;  Surgeon:  Jeryl Columbia, MD;  Location: [facility];  Service: Endoscopy;  Laterality: N/A;  . COLONOSCOPY WITH PROPOFOL N/A 07/10/2018   Procedure: COLONOSCOPY WITH PROPOFOL;  Surgeon: [name], MD;  Location: [facility];  Service: Gastroenterology;  Laterality: N/A;  . CORONARY ANGIOPLASTY WITH STENT PLACEMENT     CAD in 2006 x 2 and 2009 2 more- place din REx in [place] [facility] [place] [facility]  . CORONARY ANGIOPLASTY WITH STENT PLACEMENT  10/07/2013   Xience [place] DES 2.75  mm x 15  mm  . ESOPHAGOGASTRODUODENOSCOPY (EGD) WITH PROPOFOL N/A 08/03/2013   Procedure: ESOPHAGOGASTRODUODENOSCOPY (EGD) WITH PROPOFOL;  Surgeon: Jeryl Columbia, MD;  [facility];  Service: Endoscopy;  Laterality: N/A;  . ESOPHAGOGASTRODUODENOSCOPY (EGD) WITH PROPOFOL N/A 07/10/2018   Procedure: ESOPHAGOGASTRODUODENOSCOPY (EGD) WITH PROPOFOL;  Surgeon: [name], MD;  Location: [facility];  Service: Gastroenterology;  Laterality: N/A;  . LEFT HEART CATHETERIZATION WITH CORONARY ANGIOGRAM N/A 10/07/2013   Procedure: LEFT HEART CATHETERIZATION WITH CORONARY ANGIOGRAM;  Surgeon: Leonie Man, MD;  Location: [facility] CATH LAB;  Service: Cardiovascular;  Laterality: N/A;    Family History Family History  Problem Relation Age of Onset  . Stroke Mother   . Hypertension Mother   . Colon cancer Father   . Diabetes Father   . Heart attack Father   . Sarcoidosis Other   . Breast cancer Paternal Aunt   . Emphysema Brother   . Lung disease Brother  Unknown type, 3 brothers, one with liver and lung disease  . Asthma Paternal Aunt     Social History    reports that she quit smoking about 4 years ago. Her smoking use included cigarettes. She has a 10.00 pack-year smoking history. She has never used smokeless tobacco. She reports previous drug use. Drug: Cocaine. She reports that she does not drink alcohol.  Allergies Allergies  Allergen Reactions  . Metolazone Other (See Comments)    Dizziness and falling  . Plavix  [Clopidogrel Bisulfate] Other (See Comments)    High PRU's - non-responder  . Ibuprofen Other (See Comments)    Pt states she is not supposed to take ibuprofen because of other meds she is taking  . Nsaids Other (See Comments)    "I do not take NSAIDs d/t interfering with other meds"    Home Medications Medications Prior to Admission  Medication Sig Dispense Refill  . albuterol (PROAIR HFA) 108 (90 Base) MCG/ACT inhaler INHALE 2 PUFFS BY MOUTH 4 TIMES DAILY AS NEEDED (Patient taking differently: Inhale 2 puffs into the lungs 4 (four) times daily as needed for wheezing. ) 8.5 each 2  . aspirin 81 MG tablet Take 1 tablet (81 mg total) by mouth daily.    . budesonide-formoterol (SYMBICORT) 80-4.5 MCG/ACT inhaler Inhale 2 puffs into the lungs 2 times daily at 12 noon and 4 pm. 1 Inhaler 0  . carvedilol (COREG) 3.125 MG tablet Take 1 tablet (3.125 mg total) by mouth 2 (two) times daily with a meal. 60 tablet 0  . ferrous gluconate (FERGON) 324 MG tablet Take 1 tablet (324 mg total) by mouth daily with breakfast. Office visit needed prior to future refills 30 tablet 0  . furosemide (LASIX) 40 MG tablet Take 1 tablet (40 mg total) by mouth daily for 30 days. 30 tablet 0  . hydrocerin (EUCERIN) CREA Apply 1 application topically 2 (two) times daily.  0  . Insulin Detemir (LEVEMIR FLEXTOUCH) 100 UNIT/ML Pen Inject 18 Units into the skin daily. 15 mL 4  . ipratropium-albuterol (DUONEB) 0.5-2.5 (3) MG/3ML SOLN Use nebulizer to inhale contents of one vial every 6 hours as needed for shortness of breath, wheezing 360 mL 11  . losartan (COZAAR) 50 MG tablet Take 50 mg by mouth daily.    . nitroGLYCERIN (NITROSTAT) 0.4 MG SL tablet Place 1 tablet (0.4 mg total) under the tongue every 5 (five) minutes as needed for chest pain. 25 tablet 11  . omeprazole (PRILOSEC) 40 MG capsule Take 1 capsule (40 mg total) by mouth daily. 30 capsule 4  . Potassium Chloride ER 20 MEQ TBCR Take 20 mEq by mouth every Monday,  Wednesday, and Friday for 30 days. 12 tablet 4  . rosuvastatin (CRESTOR) 40 MG tablet Take 1 tablet (40 mg total) by mouth daily at 6 PM. 30 tablet 4  . umeclidinium-vilanterol (ANORO ELLIPTA) 62.5-25 MCG/INH AEPB Inhale 1 puff into the lungs daily.    [name] Kitchen ACCU-CHEK AVIVA PLUS test strip USE 1 STRIP TO CHECK GLUCOSE 3 TIMES DAILY 100 each 2  . NEEDLE, DISP, 26 G 26G X 1/2" MISC 1 application by Does not apply route daily. 100 each 0  . polyethylene glycol (MIRALAX) 17 g packet Take 17 g by mouth every Monday, Wednesday, and Friday. (Patient not taking: Reported on 10/26/2018) 14 each 11  . senna-docusate (SENOKOT-S) 8.6-50 MG tablet Take 1 tablet by mouth 2 (two) times daily. If needed for constipation (Patient not taking: Reported on 10/26/2018)  60 tablet 4    Hospital Medications . artificial tears  1 application Both Eyes Y1O  . aspirin EC  81 mg Oral Daily  . carvedilol  3.125 mg Oral BID WC  . enoxaparin (LOVENOX) injection  80 mg Subcutaneous Daily  . fentaNYL (SUBLIMAZE) injection  50 mcg Intravenous Once  . [START ON 10/31/2018] ferrous gluconate  324 mg Oral Q breakfast  . insulin aspart  3-9 Units Subcutaneous Q4H  . insulin detemir  8 Units Subcutaneous QHS  . ipratropium-albuterol  3 mL Nebulization TID  . isosorbide-hydrALAZINE  0.5 tablet Oral TID  . mometasone-formoterol  2 puff Inhalation BID  . pantoprazole sodium  40 mg Per Tube Daily  . polyethylene glycol  17 g Oral Q M,W,F  . rosuvastatin  40 mg Oral q1800  . senna-docusate  1 tablet Oral BID  . spironolactone  12.5 mg Oral Daily     ROS: Unable d/t AMS; intubated  Physical Examination:  Vitals:   10/28/18 0934 10/28/18 1000 10/28/18 1100 10/28/18 1103  BP:   112/61   Pulse: 77  60 60  Resp: 20   (!) 25  Temp:  (!) 95.9 F (35.5 C) 97.7 F (36.5 C)   TempSrc:  Bladder Bladder   SpO2: 95%  99% 100%  Weight:      Height:        General - morbidly obese AF woman; critical ill, vented Heart - Irreg;  brady Lungs - Crackels throughout. Intubated/vented; dyssynchronization noted Abdomen - Large, soft - non tender Extremities - Unable to palp distal pulses intact -diffuse edema throughout Skin - Warm and dry  Neurologic Examination:  Unable to appropriately complete neuro exam at this time, pt was just given paralytic and sedation. Pupils are 52mm, muted reaction.  Repeat Neurological examination: Ment: Somnolent. Briefly arouses to repeated verbal and tactile stimulation. Able to follow simple commands. Briefly makes eye contact with examiner. Nods head yes to one question. Rapidly falls back asleep.  CN: Eyes conjugate, briefly gazes at examiner. Pupils reactive. Face symmetric.  Motor: Moves upper extremities equally to command per nursing, but did not move for Neurologist. Wiggled toes bilaterally to command without asymmetry.  Sensory: Responds to tactile stimulation   LABORATORY STUDIES:  Basic Metabolic Panel: Recent Labs  Lab 10/25/18 0928 10/26/18 0013 10/27/18 0314 10/28/18 0410 10/28/18 1023 10/28/18 1031  NA 141 141 141 143 145 146*  K 5.4* 4.9 4.2 3.9 4.0 4.1  CL 107 107 105 101 103  --   CO2 21* 27 27 31  34*  --   GLUCOSE 173* 200* 119* 170* 167*  --   BUN 48* 53* 60* 58* 59*  --   CREATININE 1.80* 1.93* 1.79* 1.54* 1.74*  --   CALCIUM 9.2 9.2 9.0 9.2 8.8*  --   MG 2.6* 2.6* 2.5* 2.4 2.4  --     Liver Function Tests: Recent Labs  Lab 10/25/18 0928 10/26/18 1220 10/28/18 1023  AST 28 21 25   ALT 16 16 18   ALKPHOS 140* 124 112  BILITOT 1.2 0.7 1.1  PROT 9.1* 9.0* 9.1*  ALBUMIN 3.1* 3.1* 3.0*   No results for input(s): LIPASE, AMYLASE in the last 168 hours. No results for input(s): AMMONIA in the last 168 hours.  CBC: Recent Labs  Lab 10/25/18 0309 10/25/18 0724 10/26/18 0013 10/28/18 1023 10/28/18 1031  WBC 5.7 5.1 5.7 9.3  --   NEUTROABS 3.1 3.3  --  7.5  --   HGB 10.2*  11.3* 9.8* 9.9* 11.2*  HCT 33.7* 38.1 32.2* 33.3* 33.0*  MCV 88.0 88.0  86.3 88.6  --   PLT 228 197 216 209  --     Cardiac Enzymes: Recent Labs  Lab 10/25/18 0928 10/25/18 1553 10/26/18 0013 10/28/18 1023  TROPONINI <0.03 <0.03 <0.03 0.03*    BNP: Invalid input(s): POCBNP  CBG: Recent Labs  Lab 10/27/18 2127 10/28/18 0043 10/28/18 0608 10/28/18 0803 10/28/18 1054  GLUCAP 119* 113* 140* 149* 141*    Microbiology:   Coagulation Studies: No results for input(s): LABPROT, INR in the last 72 hours.  Urinalysis:  Recent Labs  Lab 10/28/18 1003  COLORURINE YELLOW  LABSPEC 1.008  PHURINE 5.0  GLUCOSEU NEGATIVE  HGBUR NEGATIVE  BILIRUBINUR NEGATIVE  KETONESUR NEGATIVE  PROTEINUR NEGATIVE  NITRITE NEGATIVE  LEUKOCYTESUR NEGATIVE    Lipid Panel:     Component Value Date/Time   CHOL 159 10/26/2018 1220   TRIG 75 10/26/2018 1220   HDL 40 (L) 10/26/2018 1220   CHOLHDL 4.0 10/26/2018 1220   VLDL 15 10/26/2018 1220   LDLCALC 104 (H) 10/26/2018 1220    HgbA1C:  Lab Results  Component Value Date   HGBA1C 7.1 (H) 06/01/2018    Urine Drug Screen:      Component Value Date/Time   LABOPIA NONE DETECTED 10/28/2018 1003   COCAINSCRNUR NONE DETECTED 10/28/2018 1003   COCAINSCRNUR NEGATIVE 07/28/2013 2255   LABBENZ NONE DETECTED 10/28/2018 1003   LABBENZ POSITIVE (A) 07/28/2013 2255   AMPHETMU NONE DETECTED 10/28/2018 1003   THCU NONE DETECTED 10/28/2018 1003   LABBARB NONE DETECTED 10/28/2018 1003     Alcohol Level:  No results for input(s): ETH in the last 168 hours.  Miscellaneous labs:  EKG  EKG  IMAGING: Dg Abd 1 View  Result Date: 10/28/2018 CLINICAL DATA:  OG tube placement EXAM: ABDOMEN - 1 VIEW COMPARISON:  06/12/2018 FINDINGS: Enteric tube terminates in the proximal gastric body. IMPRESSION: Enteric tube terminates in the proximal gastric body. Electronically Signed   By: Julian Hy M.D.   On: 10/28/2018 10:39   Dg Chest Port 1 View  Result Date: 10/28/2018 CLINICAL DATA:  ETT placement EXAM:  PORTABLE CHEST 1 VIEW COMPARISON:  10/25/2018 FINDINGS: Endotracheal tube terminates 4.5 cm above the carina. Multifocal patchy opacities, upper lobe predominant. While this patient has chronic scarring, superimposed right upper lobe pneumonia is not excluded. No definite pleural effusion. Cardiomegaly. Right arm PICC terminates in the lower SVC. IMPRESSION: Endotracheal tube terminates 4.5 cm above the carina. Right arm PICC terminates in the lower SVC. Multifocal patchy opacities, upper lobe predominant, some of which may reflect chronic scarring. Superimposed right upper lobe pneumonia is not excluded. Electronically Signed   By: Julian Hy M.D.   On: 10/28/2018 10:38   [place] Ekg Site Rite  Result Date: 10/27/2018 If Site Rite image not attached, placement could not be confirmed due to current cardiac rhythm.    Assessment/Plan: 59yr old female admitted on 5/27 with decompensated heart failure and acute exacerbation of COPD. 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 "Seizures" are also listed in [place], although I see no anticonvulsant on home med list. Neurology consulted for possible seizure today.  1. Given the circumstances described by staff this am, it sounds like she had respiratory distress that led to PEA arrest and seizure may simply have been a complication of that event. Seizure as primary event resulting in acute metabolic load with further respiratory decline and hypoxic failure/PEA is  also possible. Other ddx is top of the basilar syndrome or ICH or other obvious structural brain etiology causing acute obtundation, but she is awake on repeat neurological exam and does not appear to have a headache.  2. Currently unable to CTA d/t CKD3, she is not stable enough to have MRI or MRA at this time.    PLAN: -- Stat EEG to r/o status/seizures  -- Hold off on anticonvulsant till we see EEG -- Stat CT head to r/o ICH or structural reason for sudden AMS change -- Further wk up pending  above; will probably need MRI/MRA when stable to better look at vasculature -- We will continue to follow with you  Desiree Metzger-Cihelka, ARNP-C, ANVP-BC Pager: [phone]  Addendum:  -- EEG preliminary review reveals symmetric record with no electrographic seizures -- Hold off on anticonvulsant given no electrographic seizure on EEG -- CT head pending  I have seen and examined the patient. I have formulated the assessment and plan with Desiree Metzger-Cihelka, ARNP. 58 year old female with possible seizure followed by PEA arrest, versus the converse. EEG shows no electrographic seizures. CT head pending.  Electronically signed: Dr. Kerney Elbe

## 2018-10-28 NOTE — Anesthesia Procedure Notes (Signed)
Procedure Name: Intubation Date/Time: 10/28/2018 8:51 AM Performed by: Candis Shine, CRNA Pre-anesthesia Checklist: Patient identified, Emergency Drugs available, Suction available and Patient being monitored Patient Re-evaluated:Patient Re-evaluated prior to induction Oxygen Delivery Method: Circle System Utilized Preoxygenation: Pre-oxygenation with 100% oxygen Induction Type: Rapid sequence Laryngoscope Size: Glidescope and 3 Grade View: Grade I Tube type: Oral Tube size: 8.0 mm Number of attempts: 1 Airway Equipment and Method: Stylet and Video-laryngoscopy Placement Confirmation: ETT inserted through vocal cords under direct vision,  positive ETCO2 and breath sounds checked- equal and bilateral Secured at: 23 cm Tube secured with: Tape Dental Injury: Teeth and Oropharynx as per pre-operative assessment  Future Recommendations: Recommend- induction with short-acting agent, and alternative techniques readily available Comments: Code Blue intubation.

## 2018-10-28 NOTE — Progress Notes (Signed)
0750: Pt found laying across bed unresponsive by RN. Pt snoring with copious amounts of salvia on face. Suction set up. VS obtained. 111/42 (64) 92 on 5L, pulse 68, 29 respirations. Agricultural consultant and RR RN called. Dr. Reesa Chew paged. CBG 149. Pupils , equal, round, and sluggish. Pt w/ minimal gag reflex.  0045. Dr. Reesa Chew at bedside to assess. Orders to give 2mg  Ativan. RRT drew stat ABG. Dr. Reesa Chew ordered stat head CT and wanted pt to go.  0820: Pt transported to CT w/ transporter, RN and NT.  0840: Pt placed on CT table. Respiratory effort ineffective. Code blue called. See code record.  Clyde Canterbury, RN

## 2018-10-28 NOTE — Progress Notes (Signed)
Initial Nutrition Assessment  DOCUMENTATION CODES:  Morbid obesity  INTERVENTION:  Initiate TF via OGT/NGT with Vital AF 1.2 at goal rate of 45 ml/h (1080 ml per day) and Prostat 60 ml BID to provide 1696 kcals, 141 gm protein, 876 ml free water daily.   NUTRITION DIAGNOSIS:  Inadequate oral intake related to inability to eat as evidenced by NPO status.  GOAL:  Provide needs based on ASPEN/SCCM guidelines  MONITOR:  Diet advancement, Vent status, Weight trends, Labs, I & O's, TF tolerance  REASON FOR ASSESSMENT:  Consult Enteral/tube feeding initiation and management  ASSESSMENT:  58 y/o female PMHx morbid obesity, OSA on Bipap, Chronic Resp failure on home O2, DM2, CKD, COPD, CVA, CAD s/p stent, Combined CHF and anemia requiring frequent infusions. Presented to ED on 5/27 initially for SOB in setting of CHF.   RD operating remotely d/t covid precautions. All info obtained from chart.   Had acute MS changes this morning. D/t her history of seizures she was taken to have CT head. While in CT suffered PEA arrest requiring 25 min CPR w/ epi x4/bicarb x2 prior to ROSC. Pt intubated and RD consulted for initiation of nutrition support.   Prior to PEA, meal records show patient was eating 25-75% of her meals.   It is difficult to determine pts dry weight. From best can tell she had stable at around 300-310 lbs the past couple few months. She has periods of significant gain, thought d/t HF/fluid overload. Pt had first been admitted to hospital at 348 lbs. She was being diuresed and had gotten back down to 312.8 lbs (142.2 kg)  prior to her PEA. Will use this wt for dosing purposes.   Given MD stressing aggressive diuresis. Will use a more concentrated formula and modulars to help reduce free water provided by TF.   Patient is currently intubated on ventilator support MV: 11 L/min Temp (24hrs), Avg:97.6 F (36.4 C), Min:95.9 F (35.5 C), Max:98.3 F (36.8 C) Propofol: Hung, not  running BP:112/71 (77)  Labs: BG; 141, Na: 146, BNP: 1400, Albumin: 3.0, BUN/Creat:59/1.74 Meds: Insulin, ppi, miralax, senokot S, Aldactone, IV lasiz, IV iron,  Sedation/analgesia: PRN Fentanyl/Versed to maintain RASS, Proprofol (not running as of yet)  Recent Labs  Lab 10/27/18 0314 10/28/18 0410 10/28/18 1023 10/28/18 1031  NA 141 143 145 146*  K 4.2 3.9 4.0 4.1  CL 105 101 103  --   CO2 27 31 34*  --   BUN 60* 58* 59*  --   CREATININE 1.79* 1.54* 1.74*  --   CALCIUM 9.0 9.2 8.8*  --   MG 2.5* 2.4 2.4  --   GLUCOSE 119* 170* 167*  --    NUTRITION - FOCUSED PHYSICAL EXAM: Unable to conduct  Diet Order:   Diet Order            Diet NPO time specified  Diet effective now             EDUCATION NEEDS:  Not appropriate for education at this time  Skin:  Skin Assessment: Reviewed RN Assessment  Last BM:  5/28  Height:  Ht Readings from Last 1 Encounters:  10/28/18 5\' 11"  (1.803 m)   Weight:  Wt Readings from Last 1 Encounters:  10/28/18 (!) 148.3 kg   Wt Readings from Last 10 Encounters:  10/28/18 (!) 148.3 kg  08/18/18 (!) 137.4 kg  08/16/18 (!) 136.6 kg  07/10/18 (!) 197.7 kg  07/05/18 (!) 136.5 kg  06/28/18 [name] Kitchen)  137.8 kg  06/22/18 (!) 138.2 kg  06/09/18 (!) 138.7 kg  01/23/18 (!) 147 kg  01/17/18 (!) 156.4 kg   Ideal Body Weight:  70.45 kg  BMI:  Body mass index is 45.61 kg/m.  Estimated Nutritional Needs:  Kcal:  1560-1990 (11-14 kcal/kg bw) Protein:  141-176g Pro (2-2.5g/kg ibw) Fluid:  Defer to MD  Burtis Junes RD, LDN, CNSC Clinical Nutrition Available Tues-Sat via Pager: [phone] 10/28/2018 12:06 PM

## 2018-10-28 NOTE — Progress Notes (Signed)
Assisted tele visit to patient with daughter.  Abdurrahman Petersheim Ann, RN  

## 2018-10-28 NOTE — Progress Notes (Signed)
EEG complete - results pending 

## 2018-10-28 NOTE — Consult Note (Signed)
NAMEFrenchie Silva, MRN:  [id number], DOB:  1961/05/10, LOS: 3 ADMISSION DATE:  10/25/2018, CONSULTATION DATE:  5/30  REFERRING MD:  Dr. Reesa Chew  CHIEF COMPLAINT:  Cardiopulmonary arrest   Brief History   58 year old female former smoker with multiple comorbidities and recent hospitalization 05/2018 and 07/2018  for stroke, seizure and anemia re-admitted Oct 25, 2018 with progressive shortness of breath and edema found to be in decompensated combined congestive heart failure started on aggressive diuresis.  Morning of May 30th 2020 patient had neurological changes, taken for a CT head.  While in CT patient become obtunded with loss of pulse.  PEA arrest.  CPR was started- x 25 min , epi x 4 and Sodium Bicarb x 2 . Patient was intubated and transported to ICU . PCCM consulted.   History of present illness   58 year old female former smoker with multiple comorbidities including obesity hypoventilation syndrome /OSA on nocturnal BiPAP, diabetes, chronic kidney disease, COPD coronary artery disease s/p stent , combined systolic and diastolic congestive heart failure and anemia requiring frequent transfusions(no source of bleeding found on EGD or colonoscopy in the past) , chronic respiratory failure on home oxygen.  Pulmonary hypertension. patient had a recent CVA with seizures requiring intubation and hospitalization January 2020.  She was also admitted in February 2020 with anemia and weakness. On admission patient was found to have decompensated heart failure.  She was seen by cardiology started on aggressive diuresis with Lasix drip .   2D echo this admission showed slightly worsened fraction at 30 to 35%, left ventricle diffuse hypokinesis. Morning of 10/28/2018 patient had mental status changes w/ obtundation. Concern for possible seizure as has hx of seizure in past . She was given Ativan 2 mg x 1 .  An ABG was done and showed showed she was hypercarbic and acidotic.  pH 7.22, PCO2 83, PO2 93, bicarb 33  and O2 saturation 94%.  Patient was taken to radiology for a CT head.  While in CT.  Patient lost pulse with PEA arrest.  Code documentation showed patient with desaturations until PEA arrest.  Resuscitated after 1 round of CPR then was placed back into CT head with loss of pulse again requiring another round of CPR total CPR 25 minutes.  With ROSC.  Patient did receive epi x4 and sodium bicarb x2.. Patient was intubated by anesthesia. Patient was transported to ICU.  PCCM was consulted. On arrival to ICU.  Patient is unresponsive on ventilator.  Blood pressure is stable at 129/59.  Heart rate 77.  She is afebrile..   Past Medical History  Diabetes, OHS, OSA, COPD, CAD, CHF, anemia  Significant Hospital Events   10/28/2018 early a.m. with mental status changes and found unresponsive.  Taken to CT, arrest requiring neuro and ACLS measures.  Transported to ICU   Consults:  Card 5/28  PCCM 5/30  Neurol 5/30   Procedures:  5/29 PICC L  5/30 ETT    Significant Diagnostic Tests:  Echo 5/28 EF 30 to 35%, left ventricle diffuse hypokinesis right ventricle mildly enlarged, right atrial pressure 10 mmHg  Micro Data:  SARS Covid 19 >>NEG   Antimicrobials:    Interim history/subjective:  Status post PEA arrest while in CT transported to ICU PCCM consulted . On vent , unresponsive.  B/p stable no pressors   Objective   Blood pressure 124/71, pulse (!) 106, temperature 98.3 F (36.8 C), temperature source Axillary, resp. rate (!) 60, height 5\' 11"  (1.803 m), weight [name] Kitchen)  148.3 kg, last menstrual period 07/28/2013, SpO2 98 %. CVP:  [22 mmHg-29 mmHg] 26 mmHg  Vent Mode: PRVC FiO2 (%):  [40 %-100 %] 100 % Set Rate:  [16 bmp] 16 bmp Vt Set:  [560 mL] 560 mL PEEP:  [5 cmH20] 5 cmH20   Intake/Output Summary (Last 24 hours) at 10/28/2018 0939 Last data filed at 10/28/2018 0500 Gross per 24 hour  Intake 738.13 ml  Output 2575 ml  Net -1836.87 ml   Filed Weights   10/26/18 0500 10/27/18 0345  10/28/18 0500  Weight: (!) 148.6 kg (!) 141.9 kg (!) 148.3 kg    Examination: General: Morbidly obese female unresponsive on vent HENT: Dry oral mucosa [place]/AT, pupils 2 mm sluggish Lungs: Coarse breath sounds bilaterally scattered rhonchi Cardiovascular: S1-S2, no MRG Abdomen: Morbidly obese soft hypoactive bowel sounds Extremities: Warm extremities lower extremity wraps, general edema , LE 2+ Neuro: Unresponsive on vent GU: Foley   Resolved Hospital Problem list     Assessment & Plan:  Acute on chronic hypoxic and hypercarbic respiratory failure status post PEA arrest 10/28/2018 OSA/OHS-on nocturnal BiPAP and oxygen at home COPD Remote hx of PE  Plan Vent support 8 cc/kg , increase PEEP to 10 VAP Check ABG now and in a.m. Daily SBT/assess for wean Check chest x-ray now and in a.m. Wean FiO2 for O2 saturation greater than 90% Once extubated will need nocturnal BiPAP., continuous O2 .  Cont DuoNeb nebulizer  Cont Dulera  Albuterol every 2 as needed  Acutely decompensated combined diastolic and systolic heart failure with ejection fraction at 40% (echo this admission) PEA arrest 10/28/2018  Plan Cardiology is following Continue aggressive Lasix drip per cards Check EKG Trend troponins Check LA  Cont Lovenox   Acute kidney injury, CKD stage III Baseline serum creatinine 1.3, on admission 1.8  Plan Check electrolytes and replace as indicated Strict I/O  Diabetes Morbid obesity  Plan Sliding-scale -resistant scale  Decrease Levemir -adjust dose until see where BS are tr .   Anemia of chronic disease  Plan Trend CBC  Encephalopathy-acute mental status change 10/28/2018 a.m. Seizure disorder History of CVA January 2020  Plan Neurology consult Will need CT head PAD protocol with fent /versed  EEG pending   Best practice:  Diet: NPO , TF  Pain/Anxiety/Delirium protocol (if indicated): fent/versed  as  needed   VAP protocol (if indicated): in place  DVT  prophylaxis: Lovenox  GI prophylaxis: PPI  Glucose control: SSI  Mobility: BR  Code Status: Full  Family Communication: Disposition: ICU   Labs   CBC: Recent Labs  Lab 10/25/18 0309 10/25/18 0724 10/26/18 0013  WBC 5.7 5.1 5.7  NEUTROABS 3.1 3.3  --   HGB 10.2* 11.3* 9.8*  HCT 33.7* 38.1 32.2*  MCV 88.0 88.0 86.3  PLT 228 197 671    Basic Metabolic Panel: Recent Labs  Lab 10/25/18 0309 10/25/18 0928 10/26/18 0013 10/27/18 0314 10/28/18 0410  NA 140 141 141 141 143  K 4.6 5.4* 4.9 4.2 3.9  CL 108 107 107 105 101  CO2 22 21* 27 27 31   GLUCOSE 180* 173* 200* 119* 170*  BUN 50* 48* 53* 60* 58*  CREATININE 1.87* 1.80* 1.93* 1.79* 1.54*  CALCIUM 8.8* 9.2 9.2 9.0 9.2  MG  --  2.6* 2.6* 2.5* 2.4   GFR: Estimated Creatinine Clearance: 64 mL/min (A) (by C-G formula based on SCr of 1.54 mg/dL (H)). Recent Labs  Lab 10/25/18 0309 10/25/18 0724 10/26/18 0013  WBC 5.7  5.1 5.7    Liver Function Tests: Recent Labs  Lab 10/25/18 0928 10/26/18 1220  AST 28 21  ALT 16 16  ALKPHOS 140* 124  BILITOT 1.2 0.7  PROT 9.1* 9.0*  ALBUMIN 3.1* 3.1*   No results for input(s): LIPASE, AMYLASE in the last 168 hours. No results for input(s): AMMONIA in the last 168 hours.  ABG    Component Value Date/Time   PHART 7.221 (L) 10/28/2018 0807   PCO2ART 83.5 (HH) 10/28/2018 0807   PO2ART 93.1 10/28/2018 0807   HCO3 33.0 (H) 10/28/2018 0807   TCO2 >50 (H) 06/06/2018 0906   O2SAT 94.6 10/28/2018 0807     Coagulation Profile: No results for input(s): INR, PROTIME in the last 168 hours.  Cardiac Enzymes: Recent Labs  Lab 10/25/18 0928 10/25/18 1553 10/26/18 0013  TROPONINI <0.03 <0.03 <0.03    HbA1C: Hemoglobin A1C  Date/Time Value Ref Range Status  01/23/2018 02:46 PM 8.0 (A) 4.0 - 5.6 % Final  10/06/2016 04:43 PM 10.0  Final   HbA1c, POC (prediabetic range)  Date/Time Value Ref Range Status  01/23/2018 02:46 PM 8 (A) 5.7 - 6.4 % Final   HbA1c, POC  (controlled diabetic range)  Date/Time Value Ref Range Status  01/23/2018 02:46 PM 8.0 (A) 0.0 - 7.0 % Final   HbA1c POC (<> result, manual entry)  Date/Time Value Ref Range Status  01/23/2018 02:46 PM 8 4.0 - 5.6 % Final   Hgb A1c MFr Bld  Date/Time Value Ref Range Status  06/01/2018 10:24 AM 7.1 (H) 4.8 - 5.6 % Final    Comment:    (NOTE) Pre diabetes:          5.7%-6.4% Diabetes:              >6.4% Glycemic control for   <7.0% adults with diabetes   05/21/2015 05:26 AM 7.9 (H) 4.8 - 5.6 % Final    Comment:    (NOTE)         Pre-diabetes: 5.7 - 6.4         Diabetes: >6.4         Glycemic control for adults with diabetes: <7.0     CBG: Recent Labs  Lab 10/27/18 1623 10/27/18 2127 10/28/18 0043 10/28/18 0608 10/28/18 0803  GLUCAP 115* 119* 113* 140* 149*    Review of Systems:   Unable to obtain , as unresponsive on vent   Past Medical History  She,  has a past medical history of Asthma, CAD (coronary artery disease), Chronic combined systolic (congestive) and diastolic (congestive) heart failure ([place]), Chronic iron deficiency anemia, CVA (cerebral vascular accident) ([place]) (05/2018), Diabetes mellitus without complication ([place]), DOE (dyspnea on exertion), Edema of both legs, Hepatic steatosis, Hypertension, Pulmonary emboli ([place]) (2014), Respiratory failure ([place]) (09/2018), and Seizure ([place]) (05/2018).   Surgical History    Past Surgical History:  Procedure Laterality Date  . BIOPSY  07/10/2018   Procedure: BIOPSY;  Surgeon: [name], MD;  Location: [place];  Service: Gastroenterology;;  . COLONOSCOPY WITH PROPOFOL N/A 08/03/2013   Procedure: COLONOSCOPY WITH PROPOFOL;  Surgeon: Jeryl Columbia, MD;  Location: [facility];  Service: Endoscopy;  Laterality: N/A;  . COLONOSCOPY WITH PROPOFOL N/A 07/10/2018   Procedure: COLONOSCOPY WITH PROPOFOL;  Surgeon: [name], MD;  Location: [facility];  Service: Gastroenterology;  Laterality: N/A;  . CORONARY  ANGIOPLASTY WITH STENT PLACEMENT     CAD in 2006 x 2 and 2009 2 more- place din REx in [place] [facility]  . CORONARY ANGIOPLASTY WITH STENT PLACEMENT  10/07/2013   Xience [place] DES 2.75  mm x 15  mm  . ESOPHAGOGASTRODUODENOSCOPY (EGD) WITH PROPOFOL N/A 08/03/2013   Procedure: ESOPHAGOGASTRODUODENOSCOPY (EGD) WITH PROPOFOL;  Surgeon: Jeryl Columbia, MD;  Location: [facility];  Service: Endoscopy;  Laterality: N/A;  . ESOPHAGOGASTRODUODENOSCOPY (EGD) WITH PROPOFOL N/A 07/10/2018   Procedure: ESOPHAGOGASTRODUODENOSCOPY (EGD) WITH PROPOFOL;  Surgeon: [name], MD;  Location: [facility];  Service: Gastroenterology;  Laterality: N/A;  . LEFT HEART CATHETERIZATION WITH CORONARY ANGIOGRAM N/A 10/07/2013   Procedure: LEFT HEART CATHETERIZATION WITH CORONARY ANGIOGRAM;  Surgeon: Leonie Man, MD;  Location: [facility] C[facility];  Service: Cardiovascular;  Laterality: N/A;     Social History   reports that she quit smoking about 4 years ago. Her smoking use included cigarettes. She has a 10.00 pack-year smoking history. She has never used smokeless tobacco. She reports previous drug use. Drug: Cocaine. She reports that she does not drink alcohol.   Family History   Her family history includes Asthma in her paternal aunt; Breast cancer in her paternal aunt; Colon cancer in her father; Diabetes in her father; Emphysema in her brother; Heart attack in her father; Hypertension in her mother; Lung disease in her brother; Sarcoidosis in an other family member; Stroke in her mother.   Allergies Allergies  Allergen Reactions  . Metolazone Other (See Comments)    Dizziness and falling  . Plavix [Clopidogrel Bisulfate] Other (See Comments)    High PRU's - non-responder  . Ibuprofen Other (See Comments)    Pt states she is not supposed to take ibuprofen because of other meds she is taking  . Nsaids Other (See Comments)    "I do not take NSAIDs d/t interfering with other meds"     Home Medications  Prior to  Admission medications   Medication Sig Start Date End Date Taking? Authorizing Provider  albuterol (PROAIR HFA) 108 (90 Base) MCG/ACT inhaler INHALE 2 PUFFS BY MOUTH 4 TIMES DAILY AS NEEDED Patient taking differently: Inhale 2 puffs into the lungs 4 (four) times daily as needed for wheezing.  10/04/18  Yes Fulp, Cammie, MD  aspirin 81 MG tablet Take 1 tablet (81 mg total) by mouth daily. 10/09/13  Yes Barrett, Evelene Croon, PA-C  budesonide-formoterol (SYMBICORT) 80-4.5 MCG/ACT inhaler Inhale 2 puffs into the lungs 2 times daily at 12 noon and 4 pm. 10/04/18  Yes Fulp, Cammie, MD  carvedilol (COREG) 3.125 MG tablet Take 1 tablet (3.125 mg total) by mouth 2 (two) times daily with a meal. 10/04/18  Yes Fulp, Cammie, MD  ferrous gluconate (FERGON) 324 MG tablet Take 1 tablet (324 mg total) by mouth daily with breakfast. Office visit needed prior to future refills 10/04/18  Yes Fulp, Cammie, MD  furosemide (LASIX) 40 MG tablet Take 1 tablet (40 mg total) by mouth daily for 30 days. 10/04/18 11/03/18 Yes Fulp, Cammie, MD  hydrocerin (EUCERIN) CREA Apply 1 application topically 2 (two) times daily. 06/22/18  Yes Love, Ivan Anchors, PA-C  Insulin Detemir (LEVEMIR FLEXTOUCH) 100 UNIT/ML Pen Inject 18 Units into the skin daily. 10/04/18  Yes Fulp, Cammie, MD  ipratropium-albuterol (DUONEB) 0.5-2.5 (3) MG/3ML SOLN Use nebulizer to inhale contents of one vial every 6 hours as needed for shortness of breath, wheezing 10/04/18  Yes Fulp, Cammie, MD  losartan (COZAAR) 50 MG tablet Take 50 mg by mouth daily. 10/24/18  Yes [provider]  nitroGLYCERIN (NITROSTAT) 0.4 MG SL tablet Place 1  tablet (0.4 mg total) under the tongue every 5 (five) minutes as needed for chest pain. 10/04/18  Yes Fulp, Cammie, MD  omeprazole (PRILOSEC) 40 MG capsule Take 1 capsule (40 mg total) by mouth daily. 10/04/18  Yes Fulp, Cammie, MD  Potassium Chloride ER 20 MEQ TBCR Take 20 mEq by mouth every Monday, Wednesday, and Friday for 30 days. 10/04/18 11/03/18 Yes  Fulp, Cammie, MD  rosuvastatin (CRESTOR) 40 MG tablet Take 1 tablet (40 mg total) by mouth daily at 6 PM. 10/04/18  Yes Fulp, Cammie, MD  umeclidinium-vilanterol (ANORO ELLIPTA) 62.5-25 MCG/INH AEPB Inhale 1 puff into the lungs daily.   Yes [provider]  ACCU-CHEK AVIVA PLUS test strip USE 1 STRIP TO CHECK GLUCOSE 3 TIMES DAILY 08/07/18   Fulp, Cammie, MD  NEEDLE, DISP, 26 G 26G X 1/2" MISC 1 application by Does not apply route daily. 06/22/18   Love, Ivan Anchors, PA-C  polyethylene glycol (MIRALAX) 17 g packet Take 17 g by mouth every Monday, Wednesday, and Friday. Patient not taking: Reported on 10/26/2018 10/04/18   Fulp, Cammie, MD  senna-docusate (SENOKOT-S) 8.6-50 MG tablet Take 1 tablet by mouth 2 (two) times daily. If needed for constipation Patient not taking: Reported on 10/26/2018 10/04/18   Antony Blackbird, MD     Critical care time: 50 min     Shantavia Jha NP-C  [facility]  [phone]  10/28/2018

## 2018-10-28 NOTE — Code Documentation (Signed)
  Patient Name: Tamara Silva   MRN: [id number]   Date of Birth/ Sex: November 15, 1960 , female      Admission Date: 10/25/2018  Attending Provider: Damita Lack, MD  Primary Diagnosis: Acute respiratory failure with hypoxia [facility])   Indication: Pt was in her usual state of health until this AM, when she was noted to be desaturating down to PEA. Code blue was subsequently called. At the time of arrival on scene, ACLS protocol was underway.   Technical Description:  - CPR performance duration:  25 minutes  - Was defibrillation or cardioversion used? No   - Was external pacer placed? No  - Was patient intubated pre/post CPR? Yes   Medications Administered: Y = Yes; Blank = No Amiodarone    Atropine    Calcium    Epinephrine  Yx4  Lidocaine    Magnesium    Norepinephrine    Phenylephrine    Sodium bicarbonate  Yx2  Vasopressin     Post CPR evaluation:  - Final Status - Was patient successfully resuscitated ? Yes - What is current rhythm? Normal Sinus - What is current hemodynamic status? BP 123/68  Miscellaneous Information:  - Labs sent, including: N/A  - Primary team notified?  Yes  - Family Notified? No  - Additional notes/ transfer status:  Noted to desat down while getting prepped for CT scan. Initially code blue cancelled but continued to desat until PEA. Resuscitated after 1 round of CPR. Was put back in CT for CT head wo contrast and lost pulse again requiring another round of CPR. Transferred to 4N18     Mosetta Anis, MD  10/28/2018, 9:39 AM  Pager: [phone]

## 2018-10-28 NOTE — Progress Notes (Signed)
Called patients daughter and updated her on status.  Answered questions and set up Tele visit.  Will continue to monitor patient.

## 2018-10-28 NOTE — Progress Notes (Signed)
Orthopedic Tech Progress Note Patient Details:  Tamara Silva Mar 01, 1961 [id number]  Ortho Devices Type of Ortho Device: Ace wrap, Unna boot Ortho Device/Splint Location: Bilateral unna boots Ortho Device/Splint Interventions: Application   Post Interventions Patient Tolerated: Well Instructions Provided: Care of device, Adjustment of device   [name] 10/28/2018, 6:26 PM

## 2018-10-28 NOTE — Progress Notes (Signed)
Patient ID: Tamara Silva, female   DOB: September 17, 1960, 58 y.o.   MRN: [id number]     Advanced Heart Failure Rounding Note  PCP-Cardiologist: Quay Burow, MD   Subjective:    Had prolonged PEA arrest this am in CT. CPR for 25 minutes. Moved to 4N. Now sedated on vent but RN reports she moves purposefully.   CVP 20. On lasix gtt at 15 with only mild urine output (~100cc/hr)   Echo: EF 30-35%, mild RV dilation.    Objective:   Weight Range: (!) 148.3 kg Body mass index is 45.61 kg/m.   Vital Signs:   Temp:  [95.9 F (35.5 C)-98.3 F (36.8 C)] 97.7 F (36.5 C) (05/30 1200) Pulse Rate:  [51-106] 56 (05/30 1430) Resp:  [20-60] 25 (05/30 1103) BP: (99-136)/(42-80) 106/63 (05/30 1430) SpO2:  [92 %-100 %] 96 % (05/30 1430) FiO2 (%):  [40 %-100 %] 60 % (05/30 1430) Weight:  [148.3 kg] 148.3 kg (05/30 0500) Last BM Date: 10/26/18  Weight change: Filed Weights   10/26/18 0500 10/27/18 0345 10/28/18 0500  Weight: (!) 148.6 kg (!) 141.9 kg (!) 148.3 kg    Intake/Output:   Intake/Output Summary (Last 24 hours) at 10/28/2018 1445 Last data filed at 10/28/2018 1400 Gross per 24 hour  Intake 872.55 ml  Output 2200 ml  Net -1327.45 ml      Physical Exam    General:  Intubated sedated  HEENT: normal + ETT Neck: supple. JVP hard to see Carotids 2+ bilat; no bruits. No lymphadenopathy or thryomegaly appreciated. Cor: PMI nondisplaced. Brady regular Lungs: coarse Abdomen: markedly obese soft, nontender, nondistended. No hepatosplenomegaly. No bruits or masses. Good bowel sounds. Extremities: no cyanosis, clubbing, rash, 2+ edema + Foley  Neuro: intubated/sedated   Telemetry   NSR 50s-60s, personally reviewed.   Labs    CBC Recent Labs    10/26/18 0013 10/28/18 1023 10/28/18 1031 10/28/18 1418  WBC 5.7 9.3  --   --   NEUTROABS  --  7.5  --   --   HGB 9.8* 9.9* 11.2* 10.5*  HCT 32.2* 33.3* 33.0* 31.0*  MCV 86.3 88.6  --   --   PLT 216 209  --   --    Basic  Metabolic Panel Recent Labs    10/28/18 0410 10/28/18 1023 10/28/18 1031 10/28/18 1418  NA 143 145 146* 146*  K 3.9 4.0 4.1 4.0  CL 101 103  --   --   CO2 31 34*  --   --   GLUCOSE 170* 167*  --   --   BUN 58* 59*  --   --   CREATININE 1.54* 1.74*  --   --   CALCIUM 9.2 8.8*  --   --   MG 2.4 2.4  --   --    Liver Function Tests Recent Labs    10/26/18 1220 10/28/18 1023  AST 21 25  ALT 16 18  ALKPHOS 124 112  BILITOT 0.7 1.1  PROT 9.0* 9.1*  ALBUMIN 3.1* 3.0*   No results for input(s): LIPASE, AMYLASE in the last 72 hours. Cardiac Enzymes Recent Labs    10/25/18 1553 10/26/18 0013 10/28/18 1023  TROPONINI <0.03 <0.03 0.03*    BNP: BNP (last 3 results) Recent Labs    07/08/18 2100 10/25/18 0309 10/28/18 1023  BNP 669.1* 709.4* 1,423.5*    ProBNP (last 3 results) No results for input(s): PROBNP in the last 8760 hours.   D-Dimer No results for input(s):  DDIMER in the last 72 hours. Hemoglobin A1C No results for input(s): HGBA1C in the last 72 hours. Fasting Lipid Panel Recent Labs    10/26/18 1220  CHOL 159  HDL 40*  LDLCALC 104*  TRIG 75  CHOLHDL 4.0   Thyroid Function Tests No results for input(s): TSH, T4TOTAL, T3FREE, THYROIDAB in the last 72 hours.  Invalid input(s): FREET3  Other results:   Imaging    Dg Abd 1 View  Result Date: 10/28/2018 CLINICAL DATA:  OG tube placement EXAM: ABDOMEN - 1 VIEW COMPARISON:  06/12/2018 FINDINGS: Enteric tube terminates in the proximal gastric body. IMPRESSION: Enteric tube terminates in the proximal gastric body. Electronically Signed   By: Julian Hy M.D.   On: 10/28/2018 10:39   Dg Chest Port 1 View  Result Date: 10/28/2018 CLINICAL DATA:  ETT placement EXAM: PORTABLE CHEST 1 VIEW COMPARISON:  10/25/2018 FINDINGS: Endotracheal tube terminates 4.5 cm above the carina. Multifocal patchy opacities, upper lobe predominant. While this patient has chronic scarring, superimposed right upper  lobe pneumonia is not excluded. No definite pleural effusion. Cardiomegaly. Right arm PICC terminates in the lower SVC. IMPRESSION: Endotracheal tube terminates 4.5 cm above the carina. Right arm PICC terminates in the lower SVC. Multifocal patchy opacities, upper lobe predominant, some of which may reflect chronic scarring. Superimposed right upper lobe pneumonia is not excluded. Electronically Signed   By: Julian Hy M.D.   On: 10/28/2018 10:38     Medications:     Scheduled Medications: . artificial tears  1 application Both Eyes P5F  . aspirin EC  81 mg Oral Daily  . cisatracurium  0.1 mg/kg Intravenous Once  . enoxaparin (LOVENOX) injection  80 mg Subcutaneous Daily  . feeding supplement (PRO-STAT SUGAR FREE 64)  60 mL Per Tube BID  . fentaNYL (SUBLIMAZE) injection  50 mcg Intravenous Once  . [START ON 10/31/2018] ferrous gluconate  324 mg Oral Q breakfast  . insulin aspart  3-9 Units Subcutaneous Q4H  . insulin detemir  8 Units Subcutaneous QHS  . ipratropium-albuterol  3 mL Nebulization TID  . isosorbide-hydrALAZINE  0.5 tablet Oral TID  . metolazone  5 mg Oral Once  . mometasone-formoterol  2 puff Inhalation BID  . pantoprazole sodium  40 mg Per Tube Daily  . polyethylene glycol  17 g Oral Q M,W,F  . potassium chloride  20 mEq Oral Once  . rosuvastatin  40 mg Oral q1800  . senna-docusate  1 tablet Oral BID  . spironolactone  12.5 mg Oral Daily    Infusions: . sodium chloride 10 mL/hr at 10/28/18 0400  . cisatracurium (NIMBEX) infusion    . feeding supplement (VITAL AF 1.2 CAL)    . fentaNYL infusion INTRAVENOUS 100 mcg/hr (10/28/18 1303)  . ferric gluconate (FERRLECIT/NULECIT) IV Stopped (10/27/18 1401)  . furosemide (LASIX) infusion 15 mg/hr (10/28/18 1051)  . propofol (DIPRIVAN) infusion      PRN Medications: sodium chloride, acetaminophen **OR** acetaminophen, albuterol, fentaNYL, fentaNYL (SUBLIMAZE) injection, fentaNYL (SUBLIMAZE) injection, midazolam,  midazolam, nitroGLYCERIN, sodium chloride flush   Assessment/Plan   1. PEA arrest due to severe hypoxia - now intubated/sedated. RN reports she is moving purposefully despite 25 minute code - She will need aggressive diuresis (see below)  - Neuro following for ? Seizure but suspect this was hypoxia mediated 2. Acute on chronic systolic CHF: Suspect ischemic cardiomyopathy.  Echo in 1/20 with EF 40-45%, RV moderately dilated, PASP 71 mmHg.  Echo this admission with EF 30-35%, mildly dilated RV.  Suspect significant RV failure, may be related to OHS/OSA and COPD. She is massively volume overloaded and also likely has [place]. CVP now 29. Co-ox 77% - Increase lasix to 30/hr. Give metolazone  - Stop Coreg 3.125 mg bid.  - Continue Bidil 1/2 tab tid.  - Continue spironolactone 12.5 daily. Can add diamox as needed - Unna boots for lower legs. 3. AKI: Baseline creatinine around 1.3, has been 1.8-1.9 this admission, 1.74 today.  Suspect cardiorenal syndrome.  Hopefully can see some improvement in creatinine as we lower renal venous pressure. Continue to diurese aggressively for now as above.  4. Acute hypoxic respiratory failure - as above. On vent. CCM managing  3. COPD: On home oxygen at baseline.  Has quit smoking.  4. OHS/OSA: On home oxygen at all times and Bipap at night.  5. CAD: History of PCI, most recently had inferior STEMI in 5/15 with DES to RCA.  No chest pain, troponin not elevated.  - Continue ASA 81 and Crestor 40 daily.  - With fall in EF, will consider RHC/LHC after diuresis if we can stabilize her creatinine.  6. Remote PE: On Lovenox for DVT prophylaxis.  7. Anemia: History of GI bleeding, had endoscopies earlier this year with no bleeding source found.  Also with history of B12 deficiency. Transferrin saturation low, getting IV Fe.    CRITICAL CARE Performed by: Glori Bickers  Total critical care time: 45 minutes  Critical care time was exclusive of separately billable  procedures and treating other patients.  Critical care was necessary to treat or prevent imminent or life-threatening deterioration.  Critical care was time spent personally by me (independent of midlevel providers or residents) on the following activities: development of treatment plan with patient and/or surrogate as well as nursing, discussions with consultants, evaluation of patient's response to treatment, examination of patient, obtaining history from patient or surrogate, ordering and performing treatments and interventions, ordering and review of laboratory studies, ordering and review of radiographic studies, pulse oximetry and re-evaluation of patient's condition.    Length of Stay: 3  Glori Bickers, MD  10/28/2018, 2:45 PM  Advanced Heart Failure Team Pager [phone] (M-F; [place])  Please contact [facility] for night-coverage after hours (4p -7a ) and weekends on [email or web address]

## 2018-10-29 ENCOUNTER — Inpatient Hospital Stay (HOSPITAL_COMMUNITY): Payer: Medicaid Other

## 2018-10-29 DIAGNOSIS — I69998 Other sequelae following unspecified cerebrovascular disease: Secondary | ICD-10-CM

## 2018-10-29 DIAGNOSIS — R29898 Other symptoms and signs involving the musculoskeletal system: Secondary | ICD-10-CM

## 2018-10-29 DIAGNOSIS — R4182 Altered mental status, unspecified: Secondary | ICD-10-CM

## 2018-10-29 LAB — POCT I-STAT 7, (LYTES, BLD GAS, ICA,H+H)
Acid-Base Excess: 11 mmol/L — ABNORMAL HIGH (ref 0.0–2.0)
Bicarbonate: 37.1 mmol/L — ABNORMAL HIGH (ref 20.0–28.0)
Calcium, Ion: 1.22 mmol/L (ref 1.15–1.40)
HCT: 31 % — ABNORMAL LOW (ref 36.0–46.0)
Hemoglobin: 10.5 g/dL — ABNORMAL LOW (ref 12.0–15.0)
O2 Saturation: 96 %
Patient temperature: 97.9
Potassium: 3.8 mmol/L (ref 3.5–5.1)
Sodium: 146 mmol/L — ABNORMAL HIGH (ref 135–145)
TCO2: 39 mmol/L — ABNORMAL HIGH (ref 22–32)
pCO2 arterial: 54.6 mmHg — ABNORMAL HIGH (ref 32.0–48.0)
pH, Arterial: 7.439 (ref 7.350–7.450)
pO2, Arterial: 82 mmHg — ABNORMAL LOW (ref 83.0–108.0)

## 2018-10-29 LAB — BASIC METABOLIC PANEL
Anion gap: 10 (ref 5–15)
BUN: 66 mg/dL — ABNORMAL HIGH (ref 6–20)
CO2: 32 mmol/L (ref 22–32)
Calcium: 8.7 mg/dL — ABNORMAL LOW (ref 8.9–10.3)
Chloride: 102 mmol/L (ref 98–111)
Creatinine, Ser: 2.11 mg/dL — ABNORMAL HIGH (ref 0.44–1.00)
GFR calc Af Amer: 29 mL/min — ABNORMAL LOW (ref >60)
GFR calc non Af Amer: 25 mL/min — ABNORMAL LOW (ref >60)
Glucose, Bld: 178 mg/dL — ABNORMAL HIGH (ref 70–99)
Potassium: 3.9 mmol/L (ref 3.5–5.1)
Sodium: 144 mmol/L (ref 135–145)

## 2018-10-29 LAB — GLUCOSE, CAPILLARY
Glucose-Capillary: 136 mg/dL — ABNORMAL HIGH (ref 70–99)
Glucose-Capillary: 161 mg/dL — ABNORMAL HIGH (ref 70–99)
Glucose-Capillary: 198 mg/dL — ABNORMAL HIGH (ref 70–99)
Glucose-Capillary: 184 mg/dL — ABNORMAL HIGH (ref 70–99)
Glucose-Capillary: 164 mg/dL — ABNORMAL HIGH (ref 70–99)
Glucose-Capillary: 169 mg/dL — ABNORMAL HIGH (ref 70–99)

## 2018-10-29 LAB — TRIGLYCERIDES: Triglycerides: 101 mg/dL (ref <150)

## 2018-10-29 LAB — MAGNESIUM: Magnesium: 2.4 mg/dL (ref 1.7–2.4)

## 2018-10-29 LAB — PHOSPHORUS
Phosphorus: 3.7 mg/dL (ref 2.5–4.6)
Phosphorus: 2.8 mg/dL (ref 2.5–4.6)

## 2018-10-29 MED ORDER — ASPIRIN 81 MG PO CHEW
81.0000 mg | CHEWABLE_TABLET | Freq: Every day | ORAL | Status: DC
  Administered 2018-10-29 – 2018-10-30 (×2): 81 mg
  Filled 2018-10-29: qty 1

## 2018-10-29 NOTE — Progress Notes (Signed)
NAMEDarria Silva, MRN:  [id number], DOB:  03-24-1961, LOS: 4 ADMISSION DATE:  10/25/2018, CONSULTATION DATE:  10/28/2018 REFERRING MD:  Dr. Reesa Chew, CHIEF COMPLAINT:  PEA arrest   Brief History   58 year old female former smoker with multiple comorbidities and recent hospitalization 05/2018 and 07/2018  for stroke, seizure and anemia re-admitted Oct 25, 2018 with progressive shortness of breath and edema found to be in decompensated combined congestive heart failure started on aggressive diuresis.  Morning of May 30th 2020 patient had neurological changes, taken for a CT head.  While in CT patient become obtunded with loss of pulse.  PEA arrest.  CPR was started- x 25 min , epi x 4 and Sodium Bicarb x 2 . Patient was intubated and transported to ICU . PCCM consulted.   History of present illness   58 year old female former smoker with multiple comorbidities including obesity hypoventilation syndrome /OSA on nocturnal BiPAP, diabetes, chronic kidney disease, COPD coronary artery disease s/p stent , combined systolic and diastolic congestive heart failure and anemia requiring frequent transfusions(no source of bleeding found on EGD or colonoscopy in the past) , chronic respiratory failure on home oxygen.  Pulmonary hypertension. patient had a recent CVA with seizures requiring intubation and hospitalization January 2020.  She was also admitted in February 2020 with anemia and weakness. On admission patient was found to have decompensated heart failure.  She was seen by cardiology started on aggressive diuresis with Lasix drip .   2D echo this admission showed slightly worsened fraction at 30 to 35%, left ventricle diffuse hypokinesis. Morning of 10/28/2018 patient had mental status changes w/ obtundation. Concern for possible seizure as has hx of seizure in past . She was given Ativan 2 mg x 1 .  An ABG was done and showed showed she was hypercarbic and acidotic.  pH 7.22, PCO2 83, PO2 93, bicarb 33 and O2  saturation 94%.  Patient was taken to radiology for a CT head.  While in CT.  Patient lost pulse with PEA arrest.  Code documentation showed patient with desaturations until PEA arrest.  Resuscitated after 1 round of CPR then was placed back into CT head with loss of pulse again requiring another round of CPR total CPR 25 minutes.  With ROSC.  Patient did receive epi x4 and sodium bicarb x2.. Patient was intubated by anesthesia. Patient was transported to ICU.  PCCM was consulted. On arrival to ICU.  Patient is unresponsive on ventilator.  Blood pressure is stable at 129/59.  Heart rate 77.  She is afebrile..  Past Medical History  Diabetes, OHS, OSA, COPD, CAD, CHF, anemia  Significant Hospital Events   10/28/2018 early a.m. with mental status changes and found unresponsive.  Taken to CT, arrest requiring neuro and ACLS measures.  Transported to ICU  Consults:  Cardiology 5/28  PCCM 5/30  Neurology 5/30  Procedures:  5/29 PICC L  5/30 ETT   Significant Diagnostic Tests:  Echo 5/28 EF 30 to 35%, left ventricle diffuse hypokinesis right ventricle mildly enlarged, right atrial pressure 10 mmHg  Micro Data:  COVID negative  Antimicrobials:  none   Interim history/subjective:  Over the course of yesterday afternoon, the patient's ventilation parameters improved, hypercarbia thus improved, patient seemed to recover neurologically.  She is still significantly volume overloaded, and the heart failure specialist increased her Lasix drip in effort to be more aggressive with her diuresis.  Objective   Blood pressure (!) 99/50, pulse 61, temperature 97.9 F (36.6 C),  resp. rate (!) 25, height 5\' 11"  (1.803 m), weight (!) 152.1 kg, last menstrual period 07/28/2013, SpO2 94 %. CVP:  [25 mmHg-31 mmHg] 25 mmHg  Vent Mode: PRVC FiO2 (%):  [40 %-60 %] 40 % Set Rate:  [25 bmp] 25 bmp Vt Set:  [420 mL] 420 mL PEEP:  [10 cmH20] 10 cmH20 Plateau Pressure:  [31 cmH20-37 cmH20] 32 cmH20    Intake/Output Summary (Last 24 hours) at 10/29/2018 1102 Last data filed at 10/29/2018 0800 Gross per 24 hour  Intake 1777.63 ml  Output 850 ml  Net 927.63 ml   Filed Weights   10/27/18 0345 10/28/18 0500 10/29/18 0423  Weight: (!) 141.9 kg (!) 148.3 kg (!) 152.1 kg    Examination: General: Morbidly obese female in no acute distress HENT: AT/[place], ETT and OGT  in place Lungs: Breath sounds equal bilaterally, clear, decreased in the bases. Cardiovascular: RRR Abdomen: obese, protuberant, soft Extremities: 2+ edema in feet/legs to midcalf; unna boots in place Neuro: sedated somewhat but opens eyes, MAE to request. PERRLA, gag intact GU: foley in place  Resolved Hospital Problem list     Assessment & Plan:  Acute on chronic hypoxic and hypercarbic respiratory failure status post PEA arrest 10/28/2018; COPD,OSA/OHS-on nocturnal BiPAP and oxygen at home  Likely hypercarbic pulmonary arrest in the setting of CHF exacerbatio PLAN: Continue vent support 8 cc/kg IBW, maintain PEEP at10; would not anticipate extubation until CHF volume status is improved. VAP Check ABG now and in a.m. Daily SBT/assess for wean Wean FiO2 for O2 saturation greater than 90% Once extubated will need nocturnal BiPAP., continuous O2 .  Cont DuoNeb nebulizer  Cont Dulera  Albuterol every 2 as needed  Acutely decompensated combined diastolic and systolic heart failure with ejection fraction at 40% (echo this admission) PEA arrest 10/28/2018  Plan Cardiology is following Continue aggressive Lasix drip per cards Trend troponins Check LA  Cont Lovenox   Acute kidney injury, CKD stage III Baseline serum creatinine 1.3, on admission 1.8  Plan Check electrolytes and replace as indicated Strict I/O  Diabetes Morbid obesity  Plan Sliding-scale -resistant scale  Decrease Levemir -adjust dose until see where BS are tr .   Anemia of chronic disease Trend CBC  Encephalopathy-acute mental  status change 10/28/2018 a.m.  --questionable hx of Seizure disorder vs CVA January 2020  Plan Neurology following EEG unremarkable   Best practice:  Diet: tube feed Pain/Anxiety/Delirium protocol (if indicated): n/a VAP protocol (if indicated): in place DVT prophylaxis: lovenox, GI prophylaxis: PPI, HOB >30 Glucose control: SSI, LA insulin Mobility: bed Code Status: full Family Communication: will contact Disposition: ICU  Labs   CBC: Recent Labs  Lab 10/25/18 0309 10/25/18 0724 10/26/18 0013 10/28/18 1023 10/28/18 1031 10/28/18 1418  WBC 5.7 5.1 5.7 9.3  --   --   NEUTROABS 3.1 3.3  --  7.5  --   --   HGB 10.2* 11.3* 9.8* 9.9* 11.2* 10.5*  HCT 33.7* 38.1 32.2* 33.3* 33.0* 31.0*  MCV 88.0 88.0 86.3 88.6  --   --   PLT 228 197 216 209  --   --     Basic Metabolic Panel: Recent Labs  Lab 10/26/18 0013 10/27/18 0314 10/28/18 0410 10/28/18 1023 10/28/18 1031 10/28/18 1418 10/28/18 1533 10/29/18 0416  NA 141 141 143 145 146* 146*  --  144  K 4.9 4.2 3.9 4.0 4.1 4.0  --  3.9  CL 107 105 101 103  --   --   --  102  CO2 27 27 31  34*  --   --   --  32  GLUCOSE 200* 119* 170* 167*  --   --   --  178*  BUN 53* 60* 58* 59*  --   --   --  66*  CREATININE 1.93* 1.79* 1.54* 1.74*  --   --   --  2.11*  CALCIUM 9.2 9.0 9.2 8.8*  --   --   --  8.7*  MG 2.6* 2.5* 2.4 2.4  --   --   --  2.4  PHOS  --   --   --   --   --   --  4.6 3.7   GFR: Estimated Creatinine Clearance: 47.4 mL/min (A) (by C-G formula based on SCr of 2.11 mg/dL (H)). Recent Labs  Lab 10/25/18 0309 10/25/18 0724 10/26/18 0013 10/28/18 1023 10/28/18 1533 10/28/18 1727  WBC 5.7 5.1 5.7 9.3  --   --   LATICACIDVEN  --   --   --  1.3 0.9 0.9    Liver Function Tests: Recent Labs  Lab 10/25/18 0928 10/26/18 1220 10/28/18 1023  AST 28 21 25   ALT 16 16 18   ALKPHOS 140* 124 112  BILITOT 1.2 0.7 1.1  PROT 9.1* 9.0* 9.1*  ALBUMIN 3.1* 3.1* 3.0*   No results for input(s): LIPASE, AMYLASE in the  last 168 hours. Recent Labs  Lab 10/28/18 1533  AMMONIA 63*    ABG    Component Value Date/Time   PHART 7.366 10/28/2018 1418   PCO2ART 61.5 (H) 10/28/2018 1418   PO2ART 122.0 (H) 10/28/2018 1418   HCO3 35.7 (H) 10/28/2018 1418   TCO2 38 (H) 10/28/2018 1418   O2SAT 99.0 10/28/2018 1418     Coagulation Profile: No results for input(s): INR, PROTIME in the last 168 hours.  Cardiac Enzymes: Recent Labs  Lab 10/25/18 1553 10/26/18 0013 10/28/18 1023 10/28/18 1533 10/28/18 2239  TROPONINI <0.03 <0.03 0.03* 0.10* 0.10*    HbA1C: Hemoglobin A1C  Date/Time Value Ref Range Status  01/23/2018 02:46 PM 8.0 (A) 4.0 - 5.6 % Final  10/06/2016 04:43 PM 10.0  Final   HbA1c, POC (prediabetic range)  Date/Time Value Ref Range Status  01/23/2018 02:46 PM 8 (A) 5.7 - 6.4 % Final   HbA1c, POC (controlled diabetic range)  Date/Time Value Ref Range Status  01/23/2018 02:46 PM 8.0 (A) 0.0 - 7.0 % Final   HbA1c POC (<> result, manual entry)  Date/Time Value Ref Range Status  01/23/2018 02:46 PM 8 4.0 - 5.6 % Final   Hgb A1c MFr Bld  Date/Time Value Ref Range Status  06/01/2018 10:24 AM 7.1 (H) 4.8 - 5.6 % Final    Comment:    (NOTE) Pre diabetes:          5.7%-6.4% Diabetes:              >6.4% Glycemic control for   <7.0% adults with diabetes   05/21/2015 05:26 AM 7.9 (H) 4.8 - 5.6 % Final    Comment:    (NOTE)         Pre-diabetes: 5.7 - 6.4         Diabetes: >6.4         Glycemic control for adults with diabetes: <7.0     CBG: Recent Labs  Lab 10/28/18 1539 10/28/18 1928 10/28/18 2318 10/29/18 0301 10/29/18 0724  GLUCAP 107* 110* 127* 136* 161*    Review of Systems:   Unable  to obtain-pt intubated, somewhat sedated for ETT tolerance  Past Medical History  She,  has a past medical history of Asthma, CAD (coronary artery disease), Chronic combined systolic (congestive) and diastolic (congestive) heart failure ([place]), Chronic iron deficiency anemia, CVA  (cerebral vascular accident) ([place]) (05/2018), Diabetes mellitus without complication ([place]), DOE (dyspnea on exertion), Edema of both legs, Hepatic steatosis, Hypertension, Pulmonary emboli ([place]) (2014), Respiratory failure ([place]) (09/2018), and Seizure ([place]) (05/2018).   Surgical History    Past Surgical History:  Procedure Laterality Date  . BIOPSY  07/10/2018   Procedure: BIOPSY;  Surgeon: [name], MD;  Location: [facility];  Service: Gastroenterology;;  . COLONOSCOPY WITH PROPOFOL N/A 08/03/2013   Procedure: COLONOSCOPY WITH PROPOFOL;  Surgeon: Jeryl Columbia, MD;  Location: [facility];  Service: Endoscopy;  Laterality: N/A;  . COLONOSCOPY WITH PROPOFOL N/A 07/10/2018   Procedure: COLONOSCOPY WITH PROPOFOL;  Surgeon: [name], MD;  Location: [facility];  Service: Gastroenterology;  Laterality: N/A;  . CORONARY ANGIOPLASTY WITH STENT PLACEMENT     CAD in 2006 x 2 and 2009 2 more- place din REx in [place] and [place] [facility]  . CORONARY ANGIOPLASTY WITH STENT PLACEMENT  10/07/2013   Xience [place] DES 2.75  mm x 15  mm  . ESOPHAGOGASTRODUODENOSCOPY (EGD) WITH PROPOFOL N/A 08/03/2013   Procedure: ESOPHAGOGASTRODUODENOSCOPY (EGD) WITH PROPOFOL;  Surgeon: Jeryl Columbia, MD;  Location: [facility];  Service: Endoscopy;  Laterality: N/A;  . ESOPHAGOGASTRODUODENOSCOPY (EGD) WITH PROPOFOL N/A 07/10/2018   Procedure: ESOPHAGOGASTRODUODENOSCOPY (EGD) WITH PROPOFOL;  Surgeon: [name], MD;  Location: [facility];  Service: Gastroenterology;  Laterality: N/A;  . LEFT HEART CATHETERIZATION WITH CORONARY ANGIOGRAM N/A 10/07/2013   Procedure: LEFT HEART CATHETERIZATION WITH CORONARY ANGIOGRAM;  Surgeon: Leonie Man, MD;  Location: [facility] CATH [facility];  Service: Cardiovascular;  Laterality: N/A;     Social History   reports that she quit smoking about 4 years ago. Her smoking use included cigarettes. She has a 10.00 pack-year smoking history. She has never used smokeless tobacco. She reports previous drug use.  Drug: Cocaine. She reports that she does not drink alcohol.   Family History   Her family history includes Asthma in her paternal aunt; Breast cancer in her paternal aunt; Colon cancer in her father; Diabetes in her father; Emphysema in her brother; Heart attack in her father; Hypertension in her mother; Lung disease in her brother; Sarcoidosis in an other family member; Stroke in her mother.   Allergies Allergies  Allergen Reactions  . Metolazone Other (See Comments)    Dizziness and falling  . Plavix [Clopidogrel Bisulfate] Other (See Comments)    High PRU's - non-responder  . Ibuprofen Other (See Comments)    Pt states she is not supposed to take ibuprofen because of other meds she is taking  . Nsaids Other (See Comments)    "I do not take NSAIDs d/t interfering with other meds"     Home Medications  Prior to Admission medications   Medication Sig Start Date End Date Taking? Authorizing Provider  albuterol (PROAIR HFA) 108 (90 Base) MCG/ACT inhaler INHALE 2 PUFFS BY MOUTH 4 TIMES DAILY AS NEEDED Patient taking differently: Inhale 2 puffs into the lungs 4 (four) times daily as needed for wheezing.  10/04/18  Yes Fulp, Cammie, MD  aspirin 81 MG tablet Take 1 tablet (81 mg total) by mouth daily. 10/09/13  Yes Barrett, Evelene Croon, PA-C  budesonide-formoterol (SYMBICORT) 80-4.5 MCG/ACT inhaler Inhale 2 puffs into the lungs 2 times  daily at 12 noon and 4 pm. 10/04/18  Yes Fulp, Cammie, MD  carvedilol (COREG) 3.125 MG tablet Take 1 tablet (3.125 mg total) by mouth 2 (two) times daily with a meal. 10/04/18  Yes Fulp, Cammie, MD  ferrous gluconate (FERGON) 324 MG tablet Take 1 tablet (324 mg total) by mouth daily with breakfast. Office visit needed prior to future refills 10/04/18  Yes Fulp, Cammie, MD  furosemide (LASIX) 40 MG tablet Take 1 tablet (40 mg total) by mouth daily for 30 days. 10/04/18 11/03/18 Yes Fulp, Cammie, MD  hydrocerin (EUCERIN) CREA Apply 1 application topically 2 (two) times daily.  06/22/18  Yes Love, Ivan Anchors, PA-C  Insulin Detemir (LEVEMIR FLEXTOUCH) 100 UNIT/ML Pen Inject 18 Units into the skin daily. 10/04/18  Yes Fulp, Cammie, MD  ipratropium-albuterol (DUONEB) 0.5-2.5 (3) MG/3ML SOLN Use nebulizer to inhale contents of one vial every 6 hours as needed for shortness of breath, wheezing 10/04/18  Yes Fulp, Cammie, MD  losartan (COZAAR) 50 MG tablet Take 50 mg by mouth daily. 10/24/18  Yes [provider]  nitroGLYCERIN (NITROSTAT) 0.4 MG SL tablet Place 1 tablet (0.4 mg total) under the tongue every 5 (five) minutes as needed for chest pain. 10/04/18  Yes Fulp, Cammie, MD  omeprazole (PRILOSEC) 40 MG capsule Take 1 capsule (40 mg total) by mouth daily. 10/04/18  Yes Fulp, Cammie, MD  Potassium Chloride ER 20 MEQ TBCR Take 20 mEq by mouth every Monday, Wednesday, and Friday for 30 days. 10/04/18 11/03/18 Yes Fulp, Cammie, MD  rosuvastatin (CRESTOR) 40 MG tablet Take 1 tablet (40 mg total) by mouth daily at 6 PM. 10/04/18  Yes Fulp, Cammie, MD  umeclidinium-vilanterol (ANORO ELLIPTA) 62.5-25 MCG/INH AEPB Inhale 1 puff into the lungs daily.   Yes [provider]  ACCU-CHEK AVIVA PLUS test strip USE 1 STRIP TO CHECK GLUCOSE 3 TIMES DAILY 08/07/18   Fulp, Cammie, MD  NEEDLE, DISP, 26 G 26G X 1/2" MISC 1 application by Does not apply route daily. 06/22/18   Love, Ivan Anchors, PA-C  polyethylene glycol (MIRALAX) 17 g packet Take 17 g by mouth every Monday, Wednesday, and Friday. Patient not taking: Reported on 10/26/2018 10/04/18   Fulp, Cammie, MD  senna-docusate (SENOKOT-S) 8.6-50 MG tablet Take 1 tablet by mouth 2 (two) times daily. If needed for constipation Patient not taking: Reported on 10/26/2018 10/04/18   Antony Blackbird, MD     Critical care time: I have independently seen and examined the patient, reviewed data, and developed an assessment and plan. A total of 38 minutes were spent in critical care assessment and medical decision making. This critical care time does not reflect  procedure time, or teaching time or supervisory time of PA/NP/Med student/Med Resident, etc but could involve care discussion time.  Bonna Gains, MD PhD 10/29/18 11:22 AM

## 2018-10-29 NOTE — Progress Notes (Signed)
Subjective: Lying comfortably in bed. NAD. Intubated.   Objective: Current vital signs: BP (!) 99/50 (BP Location: Left Arm)   Pulse 61   Temp 97.9 F (36.6 C)   Resp (!) 25   Ht 5' 11"  (1.803 m)   Wt (!) 152.1 kg   LMP 07/28/2013 Comment: perimenopausal  SpO2 94%   BMI 46.77 kg/m  Vital signs in last 24 hours: Temp:  [95.9 F (35.5 C)-98.2 F (36.8 C)] 97.9 F (36.6 C) (05/31 0815) Pulse Rate:  [52-65] 61 (05/31 0815) Resp:  [15-25] 25 (05/31 0815) BP: (91-151)/(36-80) 99/50 (05/31 0800) SpO2:  [92 %-100 %] 94 % (05/31 0815) Arterial Line BP: (116-145)/(37-56) 130/46 (05/31 0815) FiO2 (%):  [40 %-100 %] 40 % (05/31 0800) Weight:  [152.1 kg] 152.1 kg (05/31 0423)  Intake/Output from previous day: 05/30 0701 - 05/31 0700 In: 1867.6 [I.V.:1404.3; NG/GT:353.3; IV Piggyback:110.1] Out: 850 [Urine:850] Intake/Output this shift: Total I/O In: 191.7 [I.V.:101.7; NG/GT:90] Out: -  Nutritional status:  Diet Order            Diet NPO time specified  Diet effective now             HEENT: [place]/AT Lungs: Intubated Ext: Wrappings to distal lower extremities bilaterally  Neurologic Exam: Ment: Awake. Follows simple motor commands. Shows examiner 2 fingers when examiner holds up 2 and asks how many. Significant improvement since yesterday.  CN: Eyes conjugated. Will fixate briefly on examiner. Face flaccidly symmetric.  Motor: Lifts all 4 extremities antigravity and resists with decreased effort. Maximum strength is 4-/5 proximally and 2/5 proximally. Strength to LUE and LLE is decreased somewhat relative to the right.  Sensory: Intact to FT bilateral feet.    Lab Results: Results for orders placed or performed during the hospital encounter of 10/25/18 (from the past 48 hour(s))  Glucose, capillary     Status: Abnormal   Collection Time: 10/27/18 11:40 AM  Result Value Ref Range   Glucose-Capillary 148 (H) 70 - 99 mg/dL  .Cooxemetry Panel (carboxy, met, total hgb, O2 sat)      Status: Abnormal   Collection Time: 10/27/18  1:55 PM  Result Value Ref Range   Total hemoglobin 10.1 (L) 12.0 - 16.0 g/dL   O2 Saturation 89.5 %   Carboxyhemoglobin 1.4 0.5 - 1.5 %   Methemoglobin 1.0 0.0 - 1.5 %  Glucose, capillary     Status: Abnormal   Collection Time: 10/27/18  4:23 PM  Result Value Ref Range   Glucose-Capillary 115 (H) 70 - 99 mg/dL  Glucose, capillary     Status: Abnormal   Collection Time: 10/27/18  9:27 PM  Result Value Ref Range   Glucose-Capillary 119 (H) 70 - 99 mg/dL   Comment 1 Document in Chart   Glucose, capillary     Status: Abnormal   Collection Time: 10/28/18 12:43 AM  Result Value Ref Range   Glucose-Capillary 113 (H) 70 - 99 mg/dL   Comment 1 Document in Chart   Basic metabolic panel     Status: Abnormal   Collection Time: 10/28/18  4:10 AM  Result Value Ref Range   Sodium 143 135 - 145 mmol/L   Potassium 3.9 3.5 - 5.1 mmol/L   Chloride 101 98 - 111 mmol/L   CO2 31 22 - 32 mmol/L   Glucose, Bld 170 (H) 70 - 99 mg/dL   BUN 58 (H) 6 - 20 mg/dL   Creatinine, Ser 1.54 (H) 0.44 - 1.00 mg/dL   Calcium  9.2 8.9 - 10.3 mg/dL   GFR calc non Af Amer 37 (L) >60 mL/min   GFR calc Af Amer 43 (L) >60 mL/min   Anion gap 11 5 - 15    Comment: Performed at [facility] [address] [place], [place] [address]  Magnesium     Status: None   Collection Time: 10/28/18  4:10 AM  Result Value Ref Range   Magnesium 2.4 1.7 - 2.4 mg/dL    Comment: Performed at [facility] [address], [place], [place] [address]  .Cooxemetry Panel (carboxy, met, total hgb, O2 sat)     Status: Abnormal   Collection Time: 10/28/18  4:19 AM  Result Value Ref Range   Total hemoglobin 10.3 (L) 12.0 - 16.0 g/dL   O2 Saturation 77.4 %   Carboxyhemoglobin 1.4 0.5 - 1.5 %   Methemoglobin 1.8 (H) 0.0 - 1.5 %  Glucose, capillary     Status: Abnormal   Collection Time: 10/28/18  6:08 AM  Result Value Ref Range   Glucose-Capillary 140 (H) 70 - 99 mg/dL   Glucose, capillary     Status: Abnormal   Collection Time: 10/28/18  8:03 AM  Result Value Ref Range   Glucose-Capillary 149 (H) 70 - 99 mg/dL  Blood gas, arterial     Status: Abnormal   Collection Time: 10/28/18  8:07 AM  Result Value Ref Range   FIO2 NASAL CANNULA    O2 Content 5.0 L/min   pH, Arterial 7.221 (L) 7.350 - 7.450   pCO2 arterial 83.5 (HH) 32.0 - 48.0 mmHg    Comment: RBV RBV LAUREN DOYLE, RRT AT 0845 ON 10/28/2018 BY ASHLEY DICKENS, RRT    pO2, Arterial 93.1 83.0 - 108.0 mmHg   Bicarbonate 33.0 (H) 20.0 - 28.0 mmol/L   Acid-Base Excess 5.7 (H) 0.0 - 2.0 mmol/L   O2 Saturation 94.6 %   Patient temperature 98.6    Collection site LEFT RADIAL    Drawn by [id number]    Sample type ARTERIAL DRAW    Allens test (pass/fail) PASS PASS  Urine rapid drug screen (hosp performed)     Status: None   Collection Time: 10/28/18 10:03 AM  Result Value Ref Range   Opiates NONE DETECTED NONE DETECTED   Cocaine NONE DETECTED NONE DETECTED   Benzodiazepines NONE DETECTED NONE DETECTED   Amphetamines NONE DETECTED NONE DETECTED   Tetrahydrocannabinol NONE DETECTED NONE DETECTED   Barbiturates NONE DETECTED NONE DETECTED    Comment: (NOTE) DRUG SCREEN FOR MEDICAL PURPOSES ONLY.  IF CONFIRMATION IS NEEDED FOR ANY PURPOSE, NOTIFY LAB WITHIN 5 DAYS. LOWEST DETECTABLE LIMITS FOR URINE DRUG SCREEN Drug Class                     Cutoff (ng/mL) Amphetamine and metabolites    1000 Barbiturate and metabolites    200 Benzodiazepine                 353 Tricyclics and metabolites     300 Opiates and metabolites        300 Cocaine and metabolites        300 THC                            50 Performed at [facility], [place] [address], [place],  [address]   Urinalysis, Routine w reflex microscopic     Status: Abnormal  Collection Time: 10/28/18 10:03 AM  Result Value Ref Range   Color, Urine YELLOW YELLOW   APPearance HAZY (A) CLEAR   Specific Gravity, Urine 1.008 1.005 -  1.030   pH 5.0 5.0 - 8.0   Glucose, UA NEGATIVE NEGATIVE mg/dL   Hgb urine dipstick NEGATIVE NEGATIVE   Bilirubin Urine NEGATIVE NEGATIVE   Ketones, ur NEGATIVE NEGATIVE mg/dL   Protein, ur NEGATIVE NEGATIVE mg/dL   Nitrite NEGATIVE NEGATIVE   Leukocytes,Ua NEGATIVE NEGATIVE    Comment: Performed at [facility] [address] [place], [place] [address]  CBC with Differential/Platelet     Status: Abnormal   Collection Time: 10/28/18 10:23 AM  Result Value Ref Range   WBC 9.3 4.0 - 10.5 K/uL   RBC 3.76 (L) 3.87 - 5.11 MIL/uL   Hemoglobin 9.9 (L) 12.0 - 15.0 g/dL   HCT 33.3 (L) 36.0 - 46.0 %   MCV 88.6 80.0 - 100.0 fL   MCH 26.3 26.0 - 34.0 pg   MCHC 29.7 (L) 30.0 - 36.0 g/dL   RDW 17.2 (H) 11.5 - 15.5 %   Platelets 209 150 - 400 K/uL   nRBC 0.3 (H) 0.0 - 0.2 %   Neutrophils Relative % 81 %   Neutro Abs 7.5 1.7 - 7.7 K/uL   Lymphocytes Relative 8 %   Lymphs Abs 0.8 0.7 - 4.0 K/uL   Monocytes Relative 8 %   Monocytes Absolute 0.8 0.1 - 1.0 K/uL   Eosinophils Relative 1 %   Eosinophils Absolute 0.1 0.0 - 0.5 K/uL   Basophils Relative 0 %   Basophils Absolute 0.0 0.0 - 0.1 K/uL   Immature Granulocytes 2 %   Abs Immature Granulocytes 0.16 (H) 0.00 - 0.07 K/uL    Comment: Performed at [facility] [address] [place] [address] [place], [place] [address]  Comprehensive metabolic panel     Status: Abnormal   Collection Time: 10/28/18 10:23 AM  Result Value Ref Range   Sodium 145 135 - 145 mmol/L   Potassium 4.0 3.5 - 5.1 mmol/L   Chloride 103 98 - 111 mmol/L   CO2 34 (H) 22 - 32 mmol/L   Glucose, Bld 167 (H) 70 - 99 mg/dL   BUN 59 (H) 6 - 20 mg/dL   Creatinine, Ser 1.74 (H) 0.44 - 1.00 mg/dL   Calcium 8.8 (L) 8.9 - 10.3 mg/dL   Total Protein 9.1 (H) 6.5 - 8.1 g/dL   Albumin 3.0 (L) 3.5 - 5.0 g/dL   AST 25 15 - 41 U/L   ALT 18 0 - 44 U/L   Alkaline Phosphatase 112 38 - 126 U/L   Total Bilirubin 1.1 0.3 - 1.2 mg/dL   GFR calc non Af Amer 32 (L) >60 mL/min   GFR calc Af  Amer 37 (L) >60 mL/min   Anion gap 8 5 - 15    Comment: Performed at [facility], [place] [address], [place], [place] [address]  Magnesium     Status: None   Collection Time: 10/28/18 10:23 AM  Result Value Ref Range   Magnesium 2.4 1.7 - 2.4 mg/dL    Comment: Performed at [facility] [address] [place], [place] [address]  Lactic acid, plasma     Status: None   Collection Time: 10/28/18 10:23 AM  Result Value Ref Range   Lactic Acid, Venous 1.3 0.5 - 1.9 mmol/L    Comment: Performed at [facility]  [address] [place], [place] [address]  Brain natriuretic peptide     Status: Abnormal   Collection Time: 10/28/18 10:23 AM  Result Value Ref Range   B Natriuretic Peptide 1,423.5 (H) 0.0 - 100.0 pg/mL    Comment: Performed at [facility] [address] [place], [place] [address]  Troponin I -     Status: Abnormal   Collection Time: 10/28/18 10:23 AM  Result Value Ref Range   Troponin I 0.03 (HH) <0.03 ng/mL    Comment: CRITICAL RESULT CALLED TO, READ BACK BY AND VERIFIED WITH: J.GRANADO,RN @ 1131 10/28/2018 [place] Performed at [facility], [place] [address] [place], [place] [address]   I-STAT 7, (LYTES, BLD GAS, ICA, H+H)     Status: Abnormal   Collection Time: 10/28/18 10:31 AM  Result Value Ref Range   pH, Arterial 7.222 (L) 7.350 - 7.450   pCO2 arterial 88.2 (HH) 32.0 - 48.0 mmHg   pO2, Arterial 205.0 (H) 83.0 - 108.0 mmHg   Bicarbonate 36.9 (H) 20.0 - 28.0 mmol/L   TCO2 40 (H) 22 - 32 mmol/L   O2 Saturation 99.0 %   Acid-Base Excess 6.0 (H) 0.0 - 2.0 mmol/L   Sodium 146 (H) 135 - 145 mmol/L   Potassium 4.1 3.5 - 5.1 mmol/L   Calcium, Ion 1.25 1.15 - 1.40 mmol/L   HCT 33.0 (L) 36.0 - 46.0 %   Hemoglobin 11.2 (L) 12.0 - 15.0 g/dL   Patient temperature 35.5 C    Collection site RADIAL, ALLEN'S TEST ACCEPTABLE    Drawn by RT    Sample type ARTERIAL    Comment NOTIFIED PHYSICIAN   Glucose, capillary     Status: Abnormal   Collection  Time: 10/28/18 10:54 AM  Result Value Ref Range   Glucose-Capillary 141 (H) 70 - 99 mg/dL   Comment 1 Notify RN    Comment 2 Document in Chart   MRSA PCR Screening     Status: None   Collection Time: 10/28/18  1:22 PM  Result Value Ref Range   MRSA by PCR NEGATIVE NEGATIVE    Comment:        The GeneXpert MRSA Assay (FDA approved for NASAL specimens only), is one component of a comprehensive MRSA colonization surveillance program. It is not intended to diagnose MRSA infection nor to guide or monitor treatment for MRSA infections. Performed at [facility], [place] [address] [place], [place] [address]   I-STAT 7, (LYTES, BLD GAS, ICA, H+H)     Status: Abnormal   Collection Time: 10/28/18  2:18 PM  Result Value Ref Range   pH, Arterial 7.366 7.350 - 7.450   pCO2 arterial 61.5 (H) 32.0 - 48.0 mmHg   pO2, Arterial 122.0 (H) 83.0 - 108.0 mmHg   Bicarbonate 35.7 (H) 20.0 - 28.0 mmol/L   TCO2 38 (H) 22 - 32 mmol/L   O2 Saturation 99.0 %   Acid-Base Excess 8.0 (H) 0.0 - 2.0 mmol/L   Sodium 146 (H) 135 - 145 mmol/L   Potassium 4.0 3.5 - 5.1 mmol/L   Calcium, Ion 1.23 1.15 - 1.40 mmol/L   HCT 31.0 (L) 36.0 - 46.0 %   Hemoglobin 10.5 (L) 12.0 - 15.0 g/dL   Patient temperature 35.7 C    Collection site ARTERIAL LINE    Drawn by RT    Sample type ARTERIAL   Ammonia     Status: Abnormal   Collection Time: 10/28/18  3:33 PM  Result  Value Ref Range   Ammonia 63 (H) 9 - 35 umol/L    Comment: Performed at [facility], [place] [address], [place], [place] [address]  TSH     Status: Abnormal   Collection Time: 10/28/18  3:33 PM  Result Value Ref Range   TSH 5.177 (H) 0.350 - 4.500 uIU/mL    Comment: Performed by a 3rd Generation assay with a functional sensitivity of <=0.01 uIU/mL. Performed at [facility], [place] [address] [place], [place] [address]   Lactic acid, plasma     Status: None   Collection Time: 10/28/18  3:33 PM  Result Value Ref Range   Lactic Acid,  Venous 0.9 0.5 - 1.9 mmol/L    Comment: Performed at [facility] [address] [place], [place] [address]  Troponin I - Now Then Q6H     Status: Abnormal   Collection Time: 10/28/18  3:33 PM  Result Value Ref Range   Troponin I 0.10 (HH) <0.03 ng/mL    Comment: CRITICAL VALUE NOTED.  VALUE IS CONSISTENT WITH PREVIOUSLY REPORTED AND CALLED VALUE. Performed at [facility], [place] [address], [place], [place] [address]   Phosphorus     Status: None   Collection Time: 10/28/18  3:33 PM  Result Value Ref Range   Phosphorus 4.6 2.5 - 4.6 mg/dL    Comment: Performed at [facility] [address] [place], [place] [address]  Glucose, capillary     Status: Abnormal   Collection Time: 10/28/18  3:39 PM  Result Value Ref Range   Glucose-Capillary 107 (H) 70 - 99 mg/dL   Comment 1 Notify RN    Comment 2 Document in Chart   Lactic acid, plasma     Status: None   Collection Time: 10/28/18  5:27 PM  Result Value Ref Range   Lactic Acid, Venous 0.9 0.5 - 1.9 mmol/L    Comment: Performed at [facility] [address] [place], [place] [address]  Glucose, capillary     Status: Abnormal   Collection Time: 10/28/18  7:28 PM  Result Value Ref Range   Glucose-Capillary 110 (H) 70 - 99 mg/dL  Troponin I - Now Then Q6H     Status: Abnormal   Collection Time: 10/28/18 10:39 PM  Result Value Ref Range   Troponin I 0.10 (HH) <0.03 ng/mL    Comment: CRITICAL VALUE NOTED.  VALUE IS CONSISTENT WITH PREVIOUSLY REPORTED AND CALLED VALUE. Performed at [facility], [place] [address], [place], [place] [address]   Glucose, capillary     Status: Abnormal   Collection Time: 10/28/18 11:18 PM  Result Value Ref Range   Glucose-Capillary 127 (H) 70 - 99 mg/dL  Glucose, capillary     Status: Abnormal   Collection Time: 10/29/18  3:01 AM  Result Value Ref Range   Glucose-Capillary 136 (H) 70 - 99 mg/dL  Basic metabolic panel     Status: Abnormal   Collection Time: 10/29/18  4:16  AM  Result Value Ref Range   Sodium 144 135 - 145 mmol/L   Potassium 3.9 3.5 - 5.1 mmol/L   Chloride 102 98 - 111 mmol/L   CO2 32 22 - 32 mmol/L   Glucose, Bld 178 (H) 70 - 99 mg/dL   BUN 66 (H) 6 - 20 mg/dL   Creatinine, Ser 2.11 (H) 0.44 - 1.00 mg/dL   Calcium 8.7 (L) 8.9 - 10.3 mg/dL  GFR calc non Af Amer 25 (L) >60 mL/min   GFR calc Af Amer 29 (L) >60 mL/min   Anion gap 10 5 - 15    Comment: Performed at [facility] [address], [place], [place] [address]  Magnesium     Status: None   Collection Time: 10/29/18  4:16 AM  Result Value Ref Range   Magnesium 2.4 1.7 - 2.4 mg/dL    Comment: Performed at [facility] [address] [place], [place] [address]  Triglycerides     Status: None   Collection Time: 10/29/18  4:16 AM  Result Value Ref Range   Triglycerides 101 <150 mg/dL    Comment: Performed at [facility] [address] [place], [place] [address]  Phosphorus     Status: None   Collection Time: 10/29/18  4:16 AM  Result Value Ref Range   Phosphorus 3.7 2.5 - 4.6 mg/dL    Comment: Performed at [facility] [address], [place], [place] [address]  Glucose, capillary     Status: Abnormal   Collection Time: 10/29/18  7:24 AM  Result Value Ref Range   Glucose-Capillary 161 (H) 70 - 99 mg/dL   Comment 1 Notify RN    Comment 2 Document in Chart     Recent Results (from the past 240 hour(s))  SARS Coronavirus 2 (CEPHEID - Performed in [facility] hospital lab), Hosp Order     Status: None   Collection Time: 10/25/18  3:33 AM  Result Value Ref Range Status   SARS Coronavirus 2 NEGATIVE NEGATIVE Final    Comment: (NOTE) If result is NEGATIVE SARS-CoV-2 target nucleic acids are NOT DETECTED. The SARS-CoV-2 RNA is generally detectable in upper and lower  respiratory specimens during the acute phase of infection. The lowest  concentration of SARS-CoV-2 viral copies this assay can detect is 250  copies / mL. A negative result does not  preclude SARS-CoV-2 infection  and should not be used as the sole basis for treatment or other  patient management decisions.  A negative result may occur with  improper specimen collection / handling, submission of specimen other  than nasopharyngeal swab, presence of viral mutation(s) within the  areas targeted by this assay, and inadequate number of viral copies  (<250 copies / mL). A negative result must be combined with clinical  observations, patient history, and epidemiological information. If result is POSITIVE SARS-CoV-2 target nucleic acids are DETECTED. The SARS-CoV-2 RNA is generally detectable in upper and lower  respiratory specimens dur ing the acute phase of infection.  Positive  results are indicative of active infection with SARS-CoV-2.  Clinical  correlation with patient history and other diagnostic information is  necessary to determine patient infection status.  Positive results do  not rule out bacterial infection or co-infection with other viruses. If result is PRESUMPTIVE POSTIVE SARS-CoV-2 nucleic acids MAY BE PRESENT.   A presumptive positive result was obtained on the submitted specimen  and confirmed on repeat testing.  While 2019 novel coronavirus  (SARS-CoV-2) nucleic acids may be present in the submitted sample  additional confirmatory testing may be necessary for epidemiological  and / or clinical management purposes  to differentiate between  SARS-CoV-2 and other Sarbecovirus currently known to infect humans.  If clinically indicated additional testing with an alternate test  methodology [phone]) is advised. The SARS-CoV-2 RNA is generally  detectable in upper and lower respiratory sp ecimens during the acute  phase of infection. The expected result is Negative. Fact Sheet for Patients:  StrictlyIdeas.no Fact Sheet for Healthcare Providers: BankingDealers.co.za This test is not yet approved or cleared by  the [place] FDA and has been authorized for detection and/or diagnosis of SARS-CoV-2 by FDA under an Emergency Use Authorization (EUA).  This EUA will remain in effect (meaning this test can be used) for the duration of the COVID-19 declaration under Section 564(b)(1) of the Act, 21 U.S.C. section 360bbb-3(b)(1), unless the authorization is terminated or revoked sooner. Performed at [facility], [place] [address], [place], [place] [address]   MRSA PCR Screening     Status: None   Collection Time: 10/28/18  1:22 PM  Result Value Ref Range Status   MRSA by PCR NEGATIVE NEGATIVE Final    Comment:        The GeneXpert MRSA Assay (FDA approved for NASAL specimens only), is one component of a comprehensive MRSA colonization surveillance program. It is not intended to diagnose MRSA infection nor to guide or monitor treatment for MRSA infections. Performed at [facility], [place] [address], [place], [place] [address]     Lipid Panel Recent Labs    10/26/18 1220 10/29/18 0416  CHOL 159  --   TRIG 75 101  HDL 40*  --   CHOLHDL 4.0  --   VLDL 15  --   LDLCALC 104*  --     Studies/Results: Dg Abd 1 View  Result Date: 10/28/2018 CLINICAL DATA:  OG tube placement EXAM: ABDOMEN - 1 VIEW COMPARISON:  06/12/2018 FINDINGS: Enteric tube terminates in the proximal gastric body. IMPRESSION: Enteric tube terminates in the proximal gastric body. Electronically Signed   By: Julian Hy M.D.   On: 10/28/2018 10:39   Ct Head Wo Contrast  Result Date: 10/28/2018 CLINICAL DATA:  Post code.  Altered mental status. EXAM: CT HEAD WITHOUT CONTRAST TECHNIQUE: Contiguous axial images were obtained from the base of the skull through the vertex without intravenous contrast. COMPARISON:  06/05/2018 FINDINGS: Brain: Low-density areas in the right parietal and posterior temporal lobes compatible with old infarcts. Old right caudate head lacunar infarct, stable. No acute intracranial  abnormality. Specifically, no hemorrhage, hydrocephalus, mass lesion, acute infarction, or significant intracranial injury. Vascular: No hyperdense vessel or unexpected calcification. Skull: No acute calvarial abnormality. Sinuses/Orbits: Visualized paranasal sinuses and mastoids clear. Orbital soft tissues unremarkable. 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 Old right basal ganglia lacunar infarct. No acute intracranial abnormality. Electronically Signed   By: Rolm Baptise M.D.   On: 10/28/2018 21:53   Dg Chest Port 1 View  Result Date: 10/29/2018 CLINICAL DATA:  Cardiac arrest EXAM: PORTABLE CHEST 1 VIEW COMPARISON:  10/28/2018 FINDINGS: Mild cardiomegaly. Endotracheal tube, NG tube, right PICC are stable. Patchy and hazy airspace opacities throughout both lungs are worse. Low lung volumes. No pneumothorax. IMPRESSION: Worsening bilateral airspace disease. Stable support apparatus. Electronically Signed   By: Marybelle Killings M.D.   On: 10/29/2018 08:57   Dg Chest Port 1 View  Result Date: 10/28/2018 CLINICAL DATA:  ETT placement EXAM: PORTABLE CHEST 1 VIEW COMPARISON:  10/25/2018 FINDINGS: Endotracheal tube terminates 4.5 cm above the carina. Multifocal patchy opacities, upper lobe predominant. While this patient has chronic scarring, superimposed right upper lobe pneumonia is not excluded. No definite pleural effusion. Cardiomegaly. Right arm PICC terminates in the lower SVC. IMPRESSION: Endotracheal tube terminates 4.5 cm above the carina. Right arm PICC terminates in the lower  SVC. Multifocal patchy opacities, upper lobe predominant, some of which may reflect chronic scarring. Superimposed right upper lobe pneumonia is not excluded. Electronically Signed   By: Julian Hy M.D.   On: 10/28/2018 10:38    Medications:  Scheduled: . aspirin  81 mg Per Tube Daily  . chlorhexidine gluconate (MEDLINE KIT)  15 mL Mouth Rinse BID  . Chlorhexidine Gluconate Cloth  6  each Topical Daily  . enoxaparin (LOVENOX) injection  80 mg Subcutaneous Daily  . feeding supplement (PRO-STAT SUGAR FREE 64)  60 mL Per Tube BID  . fentaNYL (SUBLIMAZE) injection  50 mcg Intravenous Once  . [START ON 10/31/2018] ferrous gluconate  324 mg Oral Q breakfast  . insulin aspart  3-9 Units Subcutaneous Q4H  . insulin detemir  8 Units Subcutaneous QHS  . ipratropium-albuterol  3 mL Nebulization TID  . isosorbide-hydrALAZINE  0.5 tablet Per Tube TID  . mouth rinse  15 mL Mouth Rinse 10 times per day  . pantoprazole sodium  40 mg Per Tube Daily  . [START ON 10/30/2018] polyethylene glycol  17 g Per Tube Q M,W,F  . rosuvastatin  40 mg Per Tube q1800  . senna-docusate  1 tablet Per Tube BID  . spironolactone  12.5 mg Per Tube Daily   Continuous: . sodium chloride 10 mL/hr at 10/29/18 0630  . feeding supplement (VITAL AF 1.2 CAL) 45 mL/hr at 10/29/18 0600  . fentaNYL infusion INTRAVENOUS Stopped (10/29/18 0800)  . furosemide (LASIX) infusion 30 mg/hr (10/28/18 2000)  . propofol (DIPRIVAN) infusion 10 mcg/kg/min (10/29/18 2500)    Assessment: 58yrold female admitted on 5/27 with decompensated heart failure and acute exacerbation of COPD. 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 "Seizures" [place] also [place] in [place] although there is no anticonvulsant on her home med list. Neurology consulted for possible seizure.  1. Given the circumstances described by staff Saturday AM, it sounds like she had respiratory distress that led to PEA arrest and seizure may simply have been a complication of that event. Seizure as primary event resulting in acute metabolic load with further respiratory decline and hypoxic failure/PEA is also possible. Other ddx is top of the basilar syndrome or ICH or other obvious structural brain etiology causing acute obtundation, but she is awake on repeat neurological exam and does not appear to have a headache.  2. Currently unable to CTA d/t CKD3, she is not stable  enough to have MRI or MRA at this time.  3. EEG shows moderate global slowing indicating a moderate global encephalopathy.  However no epileptiform discharges are noted. 4. CT head: Old right parietal and posterior temporal infarcts. Old right basal ganglia lacunar infarct. No acute intracranial abnormality  Recommendations: -- No indication for anticonvulsant at this time. EEG without electrographic seizure or interictal discharges. History equivocal for seizure.   -- Neurohospitalist service will sign off. Please call if there are additional questions.     LOS: 4 days   @Electronically  signed: Dr. EKerney Elbe5/31/2020  10:47 AM

## 2018-10-29 NOTE — Progress Notes (Signed)
Called daughter and updated her.

## 2018-10-29 NOTE — Progress Notes (Signed)
Patient ID: Tamara Silva, female   DOB: 01/12/1961, 58 y.o.   MRN: [id number]     Advanced Heart Failure Rounding Note  PCP-Cardiologist: Quay Burow, MD   Subjective:    Had prolonged PEA arrest on 5/30. CPR for 25 minutes. Moved to 4N.   Remains on vent.  Awake and follows commands.   CVP 25. On lasix gtt at 30. Now starting to diuresis briskly with over 2L out in the last few hours    Echo: EF 30-35%, mild RV dilation.    Objective:   Weight Range: (!) 152.1 kg Body mass index is 46.77 kg/m.   Vital Signs:   Temp:  [97 F (36.1 C)-98.8 F (37.1 C)] 98.8 F (37.1 C) (05/31 1415) Pulse Rate:  [56-67] 67 (05/31 1415) Resp:  [15-25] 21 (05/31 1415) BP: (91-144)/(36-59) 110/52 (05/31 1400) SpO2:  [94 %-100 %] 96 % (05/31 1415) Arterial Line BP: (116-159)/(37-56) 146/49 (05/31 1415) FiO2 (%):  [40 %-50 %] 40 % (05/31 1400) Weight:  [152.1 kg] 152.1 kg (05/31 0423) Last BM Date: 10/26/18  Weight change: Filed Weights   10/27/18 0345 10/28/18 0500 10/29/18 0423  Weight: (!) 141.9 kg (!) 148.3 kg (!) 152.1 kg    Intake/Output:   Intake/Output Summary (Last 24 hours) at 10/29/2018 1442 Last data filed at 10/29/2018 1400 Gross per 24 hour  Intake 2290.23 ml  Output 3150 ml  Net -859.77 ml      Physical Exam    General:  Intubated awake on vent following commands HEENT: normal + ETT Neck: supple. Unable to assess JVP  Carotids 2+ bilat; no bruits. No lymphadenopathy or thryomegaly appreciated. Cor: PMI non palpable Regular rate & rhythm. No rubs, gallops or murmurs. Lungs: clear Abdomen: markedly obese soft, nontender, + distended. No hepatosplenomegaly. No bruits or masses. Good bowel sounds. Extremities: no cyanosis, clubbing, rash, 3-4+ edema/anasarca Neuro: awake on vent follows commands   Telemetry   NSR 60s, personally reviewed.   Labs    CBC Recent Labs    10/28/18 1023  10/28/18 1418 10/29/18 1144  WBC 9.3  --   --   --   NEUTROABS 7.5   --   --   --   HGB 9.9*   < > 10.5* 10.5*  HCT 33.3*   < > 31.0* 31.0*  MCV 88.6  --   --   --   PLT 209  --   --   --    < > = values in this interval not displayed.   Basic Metabolic Panel Recent Labs    10/28/18 1023  10/28/18 1533 10/29/18 0416 10/29/18 1144  NA 145   < >  --  144 146*  K 4.0   < >  --  3.9 3.8  CL 103  --   --  102  --   CO2 34*  --   --  32  --   GLUCOSE 167*  --   --  178*  --   BUN 59*  --   --  66*  --   CREATININE 1.74*  --   --  2.11*  --   CALCIUM 8.8*  --   --  8.7*  --   MG 2.4  --   --  2.4  --   PHOS  --   --  4.6 3.7  --    < > = values in this interval not displayed.   Liver Function Tests Recent Labs  10/28/18 1023  AST 25  ALT 18  ALKPHOS 112  BILITOT 1.1  PROT 9.1*  ALBUMIN 3.0*   No results for input(s): LIPASE, AMYLASE in the last 72 hours. Cardiac Enzymes Recent Labs    10/28/18 1023 10/28/18 1533 10/28/18 2239  TROPONINI 0.03* 0.10* 0.10*    BNP: BNP (last 3 results) Recent Labs    07/08/18 2100 10/25/18 0309 10/28/18 1023  BNP 669.1* 709.4* 1,423.5*    ProBNP (last 3 results) No results for input(s): PROBNP in the last 8760 hours.   D-Dimer No results for input(s): DDIMER in the last 72 hours. Hemoglobin A1C No results for input(s): HGBA1C in the last 72 hours. Fasting Lipid Panel Recent Labs    10/29/18 0416  TRIG 101   Thyroid Function Tests Recent Labs    10/28/18 1533  TSH 5.177*    Other results:   Imaging    Ct Head Wo Contrast  Result Date: 10/28/2018 CLINICAL DATA:  Post code.  Altered mental status. EXAM: CT HEAD WITHOUT CONTRAST TECHNIQUE: Contiguous axial images were obtained from the base of the skull through the vertex without intravenous contrast. COMPARISON:  06/05/2018 FINDINGS: Brain: Low-density areas in the right parietal and posterior temporal lobes compatible with old infarcts. Old right caudate head lacunar infarct, stable. No acute intracranial abnormality.  Specifically, no hemorrhage, hydrocephalus, mass lesion, acute infarction, or significant intracranial injury. Vascular: No hyperdense vessel or unexpected calcification. Skull: No acute calvarial abnormality. Sinuses/Orbits: Visualized paranasal sinuses and mastoids clear. Orbital soft tissues unremarkable. 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 Old right basal ganglia lacunar infarct. No acute intracranial abnormality. Electronically Signed   By: Rolm Baptise M.D.   On: 10/28/2018 21:53   Dg Chest Port 1 View  Result Date: 10/29/2018 CLINICAL DATA:  Cardiac arrest EXAM: PORTABLE CHEST 1 VIEW COMPARISON:  10/28/2018 FINDINGS: Mild cardiomegaly. Endotracheal tube, NG tube, right PICC are stable. Patchy and hazy airspace opacities throughout both lungs are worse. Low lung volumes. No pneumothorax. IMPRESSION: Worsening bilateral airspace disease. Stable support apparatus. Electronically Signed   By: Marybelle Killings M.D.   On: 10/29/2018 08:57     Medications:     Scheduled Medications: . aspirin  81 mg Per Tube Daily  . chlorhexidine gluconate (MEDLINE KIT)  15 mL Mouth Rinse BID  . Chlorhexidine Gluconate Cloth  6 each Topical Daily  . enoxaparin (LOVENOX) injection  80 mg Subcutaneous Daily  . feeding supplement (PRO-STAT SUGAR FREE 64)  60 mL Per Tube BID  . fentaNYL (SUBLIMAZE) injection  50 mcg Intravenous Once  . [START ON 10/31/2018] ferrous gluconate  324 mg Oral Q breakfast  . insulin aspart  3-9 Units Subcutaneous Q4H  . insulin detemir  8 Units Subcutaneous QHS  . ipratropium-albuterol  3 mL Nebulization TID  . isosorbide-hydrALAZINE  0.5 tablet Per Tube TID  . mouth rinse  15 mL Mouth Rinse 10 times per day  . pantoprazole sodium  40 mg Per Tube Daily  . [START ON 10/30/2018] polyethylene glycol  17 g Per Tube Q M,W,F  . rosuvastatin  40 mg Per Tube q1800  . senna-docusate  1 tablet Per Tube BID  . spironolactone  12.5 mg Per Tube Daily     Infusions: . sodium chloride 10 mL/hr at 10/29/18 0630  . feeding supplement (VITAL AF 1.2 CAL) 45 mL/hr at 10/29/18 0600  . fentaNYL infusion INTRAVENOUS Stopped (10/29/18 0800)  . furosemide (LASIX) infusion 30 mg/hr (10/29/18 1048)  .  propofol (DIPRIVAN) infusion 10 mcg/kg/min (10/29/18 1129)    PRN Medications: sodium chloride, acetaminophen **OR** acetaminophen, albuterol, fentaNYL, fentaNYL (SUBLIMAZE) injection, fentaNYL (SUBLIMAZE) injection, midazolam, midazolam, nitroGLYCERIN, sodium chloride flush   Assessment/Plan   1. PEA arrest due to severe hypoxia on 5/30 - Remains intubated. Following commands - Neuro following for ? Seizure but suspect this was hypoxia mediated 2. Acute on chronic systolic CHF: Suspect ischemic cardiomyopathy.  Echo in 1/20 with EF 40-45%, RV moderately dilated, PASP 71 mmHg.  Echo this admission with EF 30-35%, mildly dilated RV.  Suspect significant RV failure, may be related to OHS/OSA and COPD. She is massively volume overloaded and also likely has [place]. CVP now 25 Co-ox 77% yesterday - Diuresing well Continue lasix at 30/hr with metolazone. Watch creatinine closely - Off carvedilol - Continue Bidil 1/2 tab tid.  - Continue spironolactone 12.5 daily. Can add diamox as needed 3. AKI: Baseline creatinine around 1.3, has been 1.8-1.9 this admission, 2.1 today.  Suspect cardiorenal syndrome and possibly ATN from code.  Hopefully can see some improvement in creatinine as we lower renal venous pressure.  - Continue to diurese aggressively for now as above.  4. Acute hypoxic respiratory failure - as above. On vent with FiO2 at 40%. CCM managing  3. COPD: On home oxygen at baseline.  Has quit smoking.  4. OHS/OSA: On home oxygen at all times and Bipap at night.  5. CAD: History of PCI, most recently had inferior STEMI in 5/15 with DES to RCA.  No chest pain, troponin not elevated.  - Continue ASA 81 and Crestor 40 daily.  - With fall in EF, will consider  RHC/LHC after diuresis if we can stabilize her creatinine.  6. Remote PE: On Lovenox for DVT prophylaxis.  7. Anemia: History of GI bleeding, had endoscopies earlier this year with no bleeding source found.  Also with history of B12 deficiency. Transferrin saturation low, getting IV Fe.    CRITICAL CARE Performed by: Glori Bickers  Total critical care time: 35 minutes  Critical care time was exclusive of separately billable procedures and treating other patients.  Critical care was necessary to treat or prevent imminent or life-threatening deterioration.  Critical care was time spent personally by me (independent of midlevel providers or residents) on the following activities: development of treatment plan with patient and/or surrogate as well as nursing, discussions with consultants, evaluation of patient's response to treatment, examination of patient, obtaining history from patient or surrogate, ordering and performing treatments and interventions, ordering and review of laboratory studies, ordering and review of radiographic studies, pulse oximetry and re-evaluation of patient's condition.    Length of Stay: [place], MD  10/29/2018, 2:42 PM  Advanced Heart Failure Team Pager [phone] (M-F; [place])  Please contact [facility] for night-coverage after hours (4p -7a ) and weekends on [email or web address]

## 2018-10-29 NOTE — Progress Notes (Signed)
Patient remains critically ill in the ICU under the care of PCCM team.  [facility] Will take over patient's care once transferred out.

## 2018-10-30 LAB — GLUCOSE, CAPILLARY
Glucose-Capillary: 188 mg/dL — ABNORMAL HIGH (ref 70–99)
Glucose-Capillary: 219 mg/dL — ABNORMAL HIGH (ref 70–99)
Glucose-Capillary: 206 mg/dL — ABNORMAL HIGH (ref 70–99)
Glucose-Capillary: 158 mg/dL — ABNORMAL HIGH (ref 70–99)
Glucose-Capillary: 102 mg/dL — ABNORMAL HIGH (ref 70–99)
Glucose-Capillary: 120 mg/dL — ABNORMAL HIGH (ref 70–99)

## 2018-10-30 LAB — MAGNESIUM: Magnesium: 2.1 mg/dL (ref 1.7–2.4)

## 2018-10-30 LAB — BASIC METABOLIC PANEL
Anion gap: 12 (ref 5–15)
BUN: 70 mg/dL — ABNORMAL HIGH (ref 6–20)
CO2: 35 mmol/L — ABNORMAL HIGH (ref 22–32)
Calcium: 9 mg/dL (ref 8.9–10.3)
Chloride: 96 mmol/L — ABNORMAL LOW (ref 98–111)
Creatinine, Ser: 2.21 mg/dL — ABNORMAL HIGH (ref 0.44–1.00)
GFR calc Af Amer: 28 mL/min — ABNORMAL LOW (ref >60)
GFR calc non Af Amer: 24 mL/min — ABNORMAL LOW (ref >60)
Glucose, Bld: 221 mg/dL — ABNORMAL HIGH (ref 70–99)
Potassium: 3.2 mmol/L — ABNORMAL LOW (ref 3.5–5.1)
Sodium: 143 mmol/L (ref 135–145)

## 2018-10-30 LAB — COOXEMETRY PANEL
Carboxyhemoglobin: 1.8 % — ABNORMAL HIGH (ref 0.5–1.5)
Methemoglobin: 1.7 % — ABNORMAL HIGH (ref 0.0–1.5)
O2 Saturation: 90.6 %
Total hemoglobin: 9.8 g/dL — ABNORMAL LOW (ref 12.0–16.0)

## 2018-10-30 LAB — TRIGLYCERIDES: Triglycerides: 100 mg/dL (ref <150)

## 2018-10-30 MED ORDER — POTASSIUM CHLORIDE 10 MEQ/50ML IV SOLN
10.0000 meq | INTRAVENOUS | Status: AC
  Administered 2018-10-30 (×3): 10 meq via INTRAVENOUS
  Filled 2018-10-30 (×4): qty 50

## 2018-10-30 MED ORDER — DEXAMETHASONE SODIUM PHOSPHATE 4 MG/ML IJ SOLN
4.0000 mg | Freq: Four times a day (QID) | INTRAMUSCULAR | Status: AC
  Administered 2018-10-30 (×2): 4 mg via INTRAVENOUS
  Filled 2018-10-30 (×2): qty 1

## 2018-10-30 MED ORDER — ORAL CARE MOUTH RINSE
15.0000 mL | Freq: Two times a day (BID) | OROMUCOSAL | Status: DC
  Administered 2018-10-30: 15 mL via OROMUCOSAL

## 2018-10-30 MED ORDER — HYDRALAZINE HCL 20 MG/ML IJ SOLN: 10.0000 mg | INTRAMUSCULAR | Status: DC | PRN

## 2018-10-30 MED ORDER — INSULIN ASPART 100 UNIT/ML ~~LOC~~ SOLN
3.0000 [IU] | SUBCUTANEOUS | Status: DC
  Administered 2018-10-30 – 2018-10-31 (×6): 3 [IU] via SUBCUTANEOUS

## 2018-10-30 MED ORDER — FENTANYL CITRATE (PF) 100 MCG/2ML IJ SOLN: 50.0000 ug | INTRAMUSCULAR | Status: DC | PRN

## 2018-10-30 MED ORDER — POTASSIUM CHLORIDE 20 MEQ/15ML (10%) PO SOLN
40.0000 meq | Freq: Two times a day (BID) | ORAL | Status: DC
  Administered 2018-10-30: 40 meq
  Filled 2018-10-30: qty 30

## 2018-10-30 MED FILL — Medication: Qty: 1 | Status: AC

## 2018-10-30 NOTE — Progress Notes (Addendum)
Patient ID: Tamara Silva, female   DOB: 06/11/1960, 58 y.o.   MRN: [id number]     Advanced Heart Failure Rounding Note  PCP-Cardiologist: Quay Burow, MD   Subjective:    Had prolonged PEA arrest on 5/30. CPR for 25 minutes. Moved to 4N.   Just extubated this afternoon. Has not passed swallow study so is NPO.   Awake. Following commands. Good diuresis on lasix drip at 30/hr. CCM decreased to 20/hr today. Weight down 12 pounds CVP remains 23. SBP 150-160s Co-ox inaccurate 91% (inaccurate)  Creatinine 1.74 -> 2.11 -> 2.21  Echo: EF 30-35%, mild RV dilation.    Objective:   Weight Range: (!) 147.5 kg Body mass index is 45.35 kg/m.   Vital Signs:   Temp:  [98.1 F (36.7 C)-99 F (37.2 C)] 98.8 F (37.1 C) (06/01 1500) Pulse Rate:  [62-74] 74 (06/01 1511) Resp:  [22-27] 22 (06/01 1511) BP: (86-152)/(32-58) 148/52 (06/01 1511) SpO2:  [93 %-100 %] 93 % (06/01 1511) Arterial Line BP: (135-169)/(45-55) 165/54 (06/01 1500) FiO2 (%):  [40 %] 40 % (06/01 1417) Weight:  [147.5 kg] 147.5 kg (06/01 0408) Last BM Date: 10/26/18  Weight change: Filed Weights   10/28/18 0500 10/29/18 0423 10/30/18 0408  Weight: (!) 148.3 kg (!) 152.1 kg (!) 147.5 kg    Intake/Output:   Intake/Output Summary (Last 24 hours) at 10/30/2018 1555 Last data filed at 10/30/2018 1500 Gross per 24 hour  Intake 1732.95 ml  Output 5250 ml  Net -3517.05 ml      Physical Exam    General:  Weak appearing Hoarse voice HEENT: normal Neck: supple. JVP hard to see Carotids 2+ bilat; no bruits. No lymphadenopathy or thryomegaly appreciated. Cor: PMI nondisplaced. Regular rate & rhythm. No rubs, gallops or murmurs. Lungs: coarse Abdomen: markedly obese soft, nontender, + distended. No hepatosplenomegaly. No bruits or masses. Good bowel sounds. Extremities: no cyanosis, clubbing, rash, 3-4+ edema Neuro: alert & orientedx3, cranial nerves grossly intact. moves all 4 extremities w/o difficulty. Affect  pleasant    Telemetry   NSR 70s, personally reviewed.   Labs    CBC Recent Labs    10/28/18 1023  10/28/18 1418 10/29/18 1144  WBC 9.3  --   --   --   NEUTROABS 7.5  --   --   --   HGB 9.9*   < > 10.5* 10.5*  HCT 33.3*   < > 31.0* 31.0*  MCV 88.6  --   --   --   PLT 209  --   --   --    < > = values in this interval not displayed.   Basic Metabolic Panel Recent Labs    10/29/18 0416 10/29/18 1144 10/29/18 1739 10/30/18 0422  NA 144 146*  --  143  K 3.9 3.8  --  3.2*  CL 102  --   --  96*  CO2 32  --   --  35*  GLUCOSE 178*  --   --  221*  BUN 66*  --   --  70*  CREATININE 2.11*  --   --  2.21*  CALCIUM 8.7*  --   --  9.0  MG 2.4  --   --  2.1  PHOS 3.7  --  2.8  --    Liver Function Tests Recent Labs    10/28/18 1023  AST 25  ALT 18  ALKPHOS 112  BILITOT 1.1  PROT 9.1*  ALBUMIN 3.0*   No  results for input(s): LIPASE, AMYLASE in the last 72 hours. Cardiac Enzymes Recent Labs    10/28/18 1023 10/28/18 1533 10/28/18 2239  TROPONINI 0.03* 0.10* 0.10*    BNP: BNP (last 3 results) Recent Labs    07/08/18 2100 10/25/18 0309 10/28/18 1023  BNP 669.1* 709.4* 1,423.5*    ProBNP (last 3 results) No results for input(s): PROBNP in the last 8760 hours.   D-Dimer No results for input(s): DDIMER in the last 72 hours. Hemoglobin A1C No results for input(s): HGBA1C in the last 72 hours. Fasting Lipid Panel Recent Labs    10/30/18 0422  TRIG 100   Thyroid Function Tests Recent Labs    10/28/18 1533  TSH 5.177*    Other results:   Imaging    No results found.   Medications:     Scheduled Medications: . aspirin  81 mg Per Tube Daily  . chlorhexidine gluconate (MEDLINE KIT)  15 mL Mouth Rinse BID  . Chlorhexidine Gluconate Cloth  6 each Topical Daily  . dexamethasone  4 mg Intravenous Q6H  . feeding supplement (PRO-STAT SUGAR FREE 64)  60 mL Per Tube BID  . [START ON 10/31/2018] ferrous gluconate  324 mg Oral Q breakfast  .  insulin aspart  3 Units Subcutaneous Q4H  . insulin aspart  3-9 Units Subcutaneous Q4H  . insulin detemir  8 Units Subcutaneous QHS  . ipratropium-albuterol  3 mL Nebulization TID  . isosorbide-hydrALAZINE  0.5 tablet Per Tube TID  . mouth rinse  15 mL Mouth Rinse 10 times per day  . pantoprazole sodium  40 mg Per Tube Daily  . polyethylene glycol  17 g Per Tube Q M,W,F  . rosuvastatin  40 mg Per Tube q1800  . senna-docusate  1 tablet Per Tube BID  . spironolactone  12.5 mg Per Tube Daily    Infusions: . sodium chloride 10 mL/hr at 10/30/18 1500  . feeding supplement (VITAL AF 1.2 CAL) 45 mL/hr at 10/30/18 0600  . furosemide (LASIX) infusion 20 mg/hr (10/30/18 1524)  . potassium chloride    . propofol (DIPRIVAN) infusion Stopped (10/30/18 0850)    PRN Medications: sodium chloride, acetaminophen **OR** acetaminophen, albuterol, fentaNYL (SUBLIMAZE) injection, fentaNYL (SUBLIMAZE) injection, fentaNYL (SUBLIMAZE) injection, midazolam, nitroGLYCERIN, sodium chloride flush   Assessment/Plan   1. PEA arrest due to severe hypoxia on 5/30 - Extubated earlier this afternoon. Following commands 2. Acute on chronic systolic CHF: Suspect ischemic cardiomyopathy.  Echo in 1/20 with EF 40-45%, RV moderately dilated, PASP 71 mmHg.  Echo this admission with EF 30-35%, mildly dilated RV.  Suspect significant RV failure, may be related to OHS/OSA and COPD. She is massively volume overloaded and also likely has PAH.  - Diuresing well on lasix 30/hr and metolazone. Lasix decreased to 20/hr by CCM - She remains markedly overload. Would continue lasix at 20/hr for now. Add back metolazone as able. Creatinine up slightly but may be due to ATN. Continue to follow closely.  - Off carvedilol - Now NPO until she passes swallow so off Bidil. Will give IV hydralazine - Resume spiro 12.5 when taking po 3. AKI: Baseline creatinine around 1.3, has been 1.8-1.9 this admission, 2.2 today.  Suspect cardiorenal  syndrome and possibly ATN from code.  Hopefully can see some improvement in creatinine as we lower renal venous pressure.  - Continue to diurese aa above 4. Acute hypoxic respiratory failure - extubated 6/1 - CCM starting BIPAP 3. COPD: On home oxygen at baseline.  Has quit  smoking.  4. OHS/OSA: On home oxygen at all times and Bipap at night.  5. CAD: History of PCI, most recently had inferior STEMI in 5/15 with DES to RCA.  No chest pain, troponin not elevated.  - Continue ASA 81 and Crestor 40 daily when taking pos - With fall in EF, will consider RHC/LHC after diuresis if we can stabilize her creatinine.  6. Remote PE: On Lovenox for DVT prophylaxis.  7. Anemia: History of GI bleeding, had endoscopies earlier this year with no bleeding source found.  Also with history of B12 deficiency. Transferrin saturation low, getting IV Fe.   8. Hypokalemia - supp with IV K. - Discussed dosing with PharmD personally.  CRITICAL CARE Performed by: Glori Bickers  Total critical care time: 45 minutes  Critical care time was exclusive of separately billable procedures and treating other patients.  Critical care was necessary to treat or prevent imminent or life-threatening deterioration.  Critical care was time spent personally by me (independent of midlevel providers or residents) on the following activities: development of treatment plan with patient and/or surrogate as well as nursing, discussions with consultants, evaluation of patient's response to treatment, examination of patient, obtaining history from patient or surrogate, ordering and performing treatments and interventions, ordering and review of laboratory studies, ordering and review of radiographic studies, pulse oximetry and re-evaluation of patient's condition.    Length of Stay: [place], MD  10/30/2018, 3:55 PM  Advanced Heart Failure Team Pager [phone] (M-F; 7a - 4p)  Please contact [facility] for  night-coverage after hours (4p -7a ) and weekends on [email or web address]

## 2018-10-30 NOTE — TOC Progression Note (Signed)
Transition of Care [facility]) - Progression Note    Patient Details  Name: Tamara Silva MRN: [id number] Date of Birth: 02-Jun-1960  [facility] CM/SW Contact  Ella Bodo, RN Phone Number: 10/30/2018, 1:51 PM  Clinical Narrative:  Pt with hypoxia and PEA arrest on 10/28/2018.  Extubated today; currently on BIPAP.  Would recommend PT/OT re-evaluations when pt able to tolerate therapies.       Expected Discharge Plan: [place] Barriers to Discharge: Continued Medical Work up  Expected Discharge Plan and Services Expected Discharge Plan: [place]   Discharge Planning Services: CM Consult Post Acute Care Choice: Home Health                             HH Arranged: RN [place]: [facility]) Date [place]: 10/26/18 Time [place]: 1114 Representative spoke with at [place]: [place] ([place]) Interventions    Readmission Risk Interventions Readmission Risk Prevention Plan 01/17/2018  Transportation Screening Complete  HRI or Home Care Consult Complete  Some recent data might be hidden   Reinaldo Raddle, RN, BSN  Trauma/Neuro ICU Case Manager [phone]

## 2018-10-30 NOTE — Progress Notes (Signed)
NAMEJaicee Silva, MRN:  [id number], DOB:  06/04/1960, LOS: 5 ADMISSION DATE:  10/25/2018, CONSULTATION DATE:  10/28/2018 REFERRING MD:  Dr. Reesa Chew, CHIEF COMPLAINT:  PEA arrest   Brief History   58 year old female former smoker with multiple comorbidities and recent hospitalization 05/2018 and 07/2018  for stroke, seizure and anemia re-admitted Oct 25, 2018 with progressive shortness of breath and edema found to be in decompensated combined congestive heart failure started on aggressive diuresis.  Morning of May 30th 2020 patient had neurological changes, taken for a CT head.  While in CT patient become obtunded with loss of pulse.  PEA arrest.  CPR was started- x 25 min , epi x 4 and Sodium Bicarb x 2 . Patient was intubated and transported to ICU . PCCM consulted.   History of present illness   58 year old female former smoker with multiple comorbidities including obesity hypoventilation syndrome /OSA on nocturnal BiPAP, diabetes, chronic kidney disease, COPD coronary artery disease s/p stent , combined systolic and diastolic congestive heart failure and anemia requiring frequent transfusions(no source of bleeding found on EGD or colonoscopy in the past) , chronic respiratory failure on home oxygen.  Pulmonary hypertension. patient had a recent CVA with seizures requiring intubation and hospitalization January 2020.  She was also admitted in February 2020 with anemia and weakness. On admission patient was found to have decompensated heart failure.  She was seen by cardiology started on aggressive diuresis with Lasix drip .   2D echo this admission showed slightly worsened fraction at 30 to 35%, left ventricle diffuse hypokinesis. Morning of 10/28/2018 patient had mental status changes w/ obtundation. Concern for possible seizure as has hx of seizure in past . She was given Ativan 2 mg x 1 .  An ABG was done and showed showed she was hypercarbic and acidotic.  pH 7.22, PCO2 83, PO2 93, bicarb 33 and O2  saturation 94%.  Patient was taken to radiology for a CT head.  While in CT.  Patient lost pulse with PEA arrest.  Code documentation showed patient with desaturations until PEA arrest.  Resuscitated after 1 round of CPR then was placed back into CT head with loss of pulse again requiring another round of CPR total CPR 25 minutes.  With ROSC.  Patient did receive epi x4 and sodium bicarb x2.. Patient was intubated by anesthesia. Patient was transported to ICU.  PCCM was consulted. On arrival to ICU.  Patient is unresponsive on ventilator.  Blood pressure is stable at 129/59.  Heart rate 77.  She is afebrile..  Past Medical History  Diabetes, OHS, OSA, COPD, CAD, CHF, anemia  Significant Hospital Events   10/28/2018 early a.m. with mental status changes and found unresponsive.  Taken to CT, arrest requiring neuro and ACLS measures.  Transported to ICU  Consults:  Cardiology 5/28  PCCM 5/30  Neurology 5/30  Procedures:  5/29 PICC L  5/30 ETT   Significant Diagnostic Tests:  Echo 5/28 EF 30 to 35%, left ventricle diffuse hypokinesis right ventricle mildly enlarged, right atrial pressure 10 mmHg  Micro Data:  COVID negative  Antimicrobials:  none   Interim history/subjective:  Looks better.  Following commands, no distress  Objective   Blood pressure (Abnormal) 144/53, pulse 67, temperature 98.4 F (36.9 C), temperature source Rectal, resp. rate (Abnormal) 25, height 5\' 11"  (1.803 m), weight (Abnormal) 147.5 kg, last menstrual period 07/28/2013, SpO2 98 %. CVP:  [1 mmHg-26 mmHg] 24 mmHg  Vent Mode: PRVC FiO2 (%):  [  40 %] 40 % Set Rate:  [25 bmp] 25 bmp Vt Set:  [420 mL] 420 mL PEEP:  [8 cmH20-10 cmH20] 8 cmH20 Plateau Pressure:  [25 cmH20-32 cmH20] 27 cmH20   Intake/Output Summary (Last 24 hours) at 10/30/2018 8916 Last data filed at 10/30/2018 0800 Gross per 24 hour  Intake 2100.11 ml  Output 5450 ml  Net -3349.89 ml   Filed Weights   10/28/18 0500 10/29/18 0423 10/30/18  0408  Weight: (Abnormal) 148.3 kg (Abnormal) 152.1 kg (Abnormal) 147.5 kg    Examination: General: Morbidly obese female in no acute distress HENT: AT/[place], ETT and OGT  in place Lungs: Breath sounds equal bilaterally, clear, decreased in the bases. Cardiovascular: RRR Abdomen: obese, protuberant, soft Extremities: 2+ edema in feet/legs to midcalf; unna boots in place Neuro: sedated somewhat but opens eyes, MAE to request. PERRLA, gag intact GU: foley in place  Resolved Hospital Problem list     Assessment & Plan:  Acute on chronic hypoxic and hypercarbic respiratory failure status post PEA arrest 10/28/2018; COPD,OSA/OHS-on nocturnal BiPAP and oxygen at home.  Seemingly etiology secondary to pulmonary edema from decompensated heart failure Portable chest x-ray personally reviewed from 5/30 demonstrates endotracheal tube in satisfactory position does have bilateral pulmonary edema  Plan  Continuing diuresis  Continuing full ventilator support, we will assess daily spontaneous breathing trial, have asked nursing staff to discontinue propofol  Will need mandatory at bedtime BiPAP and BiPAP PRN  Scheduled bronchodilators  A.m. chest x-ray  VAP bundle    Acutely decompensated combined diastolic and systolic heart failure with ejection fraction at 40% (echo this admission) PEA arrest 10/28/2018 Hemodynamics are stable, now 5.6 L negative Plan Continue telemetry monitoring Continuing diuresis with reduced dosing Continuing metolazone Holding beta-blockade   acute kidney injury, CKD stage III Baseline serum creatinine 1.3, on admission 1.8 Plan We will decrease Lasix drip Keep euvolemic at this point Renal dose medications as indicated Strict intake output  Fluid and electrolyte imbalance: Hypokalemia Plan Replace and recheck  Diabetes Morbid obesity Plan Continuing sliding scale and basal dose insulin.   Will hold on further dosing of Levemir and use aspart in place of  this as hopefully we can look towards extubation soon  Anemia of chronic disease Plan Trending CBC  Acute metabolic encephalopathy-acute mental status change 10/28/2018 a.m.  --questionable hx of Seizure disorder vs CVA January 2020 Neurology following, EEG negative.  Amazingly she is awake and following commands Plan Continuing supportive care Stopping propofol  Best practice:  Diet: tube feed Pain/Anxiety/Delirium protocol (if indicated): n/a VAP protocol (if indicated): in place DVT prophylaxis: lovenox, GI prophylaxis: PPI, HOB >30 Glucose control: SSI, LA insulin Mobility: bed Code Status: full Family Communication: will contact Disposition: ICU she remains critically ill, surprisingly doing much better than 1 would think after a 25-minute cardiac arrest.  She is slowly improving.  Volume status now -5 L.  Today we will work on weaning, stopping sedation.  Going forward will absolutely have to use noninvasive ventilation at night, and PRN.  Suspect underlying poorly compliant sleep apnea and OHS also complicating factor particularly with her poor cardiac function  Erick Colace ACNP-BC [place] Pager # [phone] OR # [phone] if no answer

## 2018-10-30 NOTE — Progress Notes (Signed)
Placed on BIPAP per patient request. Tolerating well at this time. RT will continue to monitor.

## 2018-10-30 NOTE — Progress Notes (Signed)
Occupational Therapy Discharge Patient Details Name: Tamara Silva MRN: [id number] DOB: 1961-03-11 Today's Date: 10/30/2018 Time:  -     Patient discharged from OT services secondary to medical decline - will need to re-order OT to resume therapy services.  Please see latest therapy progress note for current level of functioning and progress toward goals.    Progress and discharge plan discussed with patient and/or caregiver: Patient unable to participate in discharge planning and no caregivers available   Pt with PEA arrest and now intubated.  Please reconsult OT when medically appropriate.  Lucille Passy, OTR/L Acute Rehabilitation Services Pager [phone] Office [phone]   Tamara Silva, [name] 10/30/2018, 6:11 AM

## 2018-10-30 NOTE — Progress Notes (Signed)
Physical Therapy Discharge Patient Details Name: Tamara Silva MRN: [id number] DOB: 06/14/1960 Today's Date: 10/30/2018 Time:  -     Patient discharged from PT services secondary to medical decline - will need to re-order PT to resume therapy services.  Please see latest therapy progress note for current level of functioning and progress toward goals.    Progress and discharge plan discussed with patient and/or caregiver: Patient unable to participate in discharge planning and no caregivers available   Pt with PEA arrest and now intubated.  Please reconsult PT when medically appropriate.   Marguarite Arbour A Demontay Grantham 10/30/2018, 6:50 AM  Wray Kearns, PT, DPT Acute Rehabilitation Services Pager [phone] Office [phone]

## 2018-10-30 NOTE — Procedures (Signed)
Extubation Procedure Note  Patient Details:   Name: Tamara Silva DOB: Mar 11, 1961 MRN: [id number]   Airway Documentation:    Vent end date: 10/30/18 Vent end time: 1215   Evaluation  O2 sats: transiently fell during during procedure Complications: Complications of Expriratory & Inspiratory wheezing upper airway Patient did tolerate procedure well. Bilateral Breath Sounds: Expiratory wheezes, Inspiratory wheezes   Yes   Positive cuff leak noted.  Patient placed on Bipap 16/6 40%, RR 14.  Patient has audible I/E wheezing, albuterol neb treatment given.  RN at bedside. RT will continue to monitor.  Bayard Beaver 10/30/2018, 12:29 PM

## 2018-10-30 NOTE — Progress Notes (Signed)
Whip in a-line, following cuff pressures.   Leticia Clas, RN BSN

## 2018-10-30 NOTE — Progress Notes (Signed)
Patient trialed off Bipap and placed on [place] 4 L with humidity. Patient has a strong congested cough and is clearing secretions with a yankuer.  RN notified.  Bipap on standby if needed.

## 2018-10-31 ENCOUNTER — Inpatient Hospital Stay (HOSPITAL_COMMUNITY): Payer: Medicaid Other

## 2018-10-31 DIAGNOSIS — N179 Acute kidney failure, unspecified: Secondary | ICD-10-CM

## 2018-10-31 DIAGNOSIS — N182 Chronic kidney disease, stage 2 (mild): Secondary | ICD-10-CM

## 2018-10-31 DIAGNOSIS — I5023 Acute on chronic systolic (congestive) heart failure: Secondary | ICD-10-CM

## 2018-10-31 LAB — CBC
HCT: 29.4 % — ABNORMAL LOW (ref 36.0–46.0)
Hemoglobin: 9.1 g/dL — ABNORMAL LOW (ref 12.0–15.0)
MCH: 26.5 pg (ref 26.0–34.0)
MCHC: 31 g/dL (ref 30.0–36.0)
MCV: 85.5 fL (ref 80.0–100.0)
Platelets: 190 10*3/uL (ref 150–400)
RBC: 3.44 MIL/uL — ABNORMAL LOW (ref 3.87–5.11)
RDW: 17.5 % — ABNORMAL HIGH (ref 11.5–15.5)
WBC: 8.9 10*3/uL (ref 4.0–10.5)
nRBC: 0.3 % — ABNORMAL HIGH (ref 0.0–0.2)

## 2018-10-31 LAB — TRIGLYCERIDES: Triglycerides: 72 mg/dL (ref <150)

## 2018-10-31 LAB — COOXEMETRY PANEL
Carboxyhemoglobin: 2 % — ABNORMAL HIGH (ref 0.5–1.5)
Methemoglobin: 1 % (ref 0.0–1.5)
O2 Saturation: 78.1 %
Total hemoglobin: 10.1 g/dL — ABNORMAL LOW (ref 12.0–16.0)

## 2018-10-31 LAB — GLUCOSE, CAPILLARY
Glucose-Capillary: 145 mg/dL — ABNORMAL HIGH (ref 70–99)
Glucose-Capillary: 149 mg/dL — ABNORMAL HIGH (ref 70–99)
Glucose-Capillary: 203 mg/dL — ABNORMAL HIGH (ref 70–99)
Glucose-Capillary: 188 mg/dL — ABNORMAL HIGH (ref 70–99)
Glucose-Capillary: 165 mg/dL — ABNORMAL HIGH (ref 70–99)

## 2018-10-31 LAB — BASIC METABOLIC PANEL
Anion gap: 17 — ABNORMAL HIGH (ref 5–15)
BUN: 71 mg/dL — ABNORMAL HIGH (ref 6–20)
CO2: 39 mmol/L — ABNORMAL HIGH (ref 22–32)
Calcium: 9.7 mg/dL (ref 8.9–10.3)
Chloride: 89 mmol/L — ABNORMAL LOW (ref 98–111)
Creatinine, Ser: 2.26 mg/dL — ABNORMAL HIGH (ref 0.44–1.00)
GFR calc Af Amer: 27 mL/min — ABNORMAL LOW (ref >60)
GFR calc non Af Amer: 23 mL/min — ABNORMAL LOW (ref >60)
Glucose, Bld: 151 mg/dL — ABNORMAL HIGH (ref 70–99)
Potassium: 3.5 mmol/L (ref 3.5–5.1)
Sodium: 145 mmol/L (ref 135–145)

## 2018-10-31 LAB — CREATININE, SERUM
Creatinine, Ser: 2.25 mg/dL — ABNORMAL HIGH (ref 0.44–1.00)
GFR calc Af Amer: 27 mL/min — ABNORMAL LOW (ref >60)
GFR calc non Af Amer: 23 mL/min — ABNORMAL LOW (ref >60)

## 2018-10-31 MED ORDER — PANTOPRAZOLE SODIUM 40 MG PO TBEC
40.0000 mg | DELAYED_RELEASE_TABLET | Freq: Every day | ORAL | Status: DC
  Administered 2018-10-31 – 2018-11-07 (×8): 40 mg via ORAL
  Filled 2018-10-31 (×8): qty 1

## 2018-10-31 MED ORDER — INSULIN ASPART 100 UNIT/ML ~~LOC~~ SOLN
3.0000 [IU] | Freq: Three times a day (TID) | SUBCUTANEOUS | Status: DC
  Administered 2018-10-31 – 2018-11-07 (×19): 3 [IU] via SUBCUTANEOUS

## 2018-10-31 MED ORDER — SPIRONOLACTONE 12.5 MG HALF TABLET
12.5000 mg | ORAL_TABLET | Freq: Every day | ORAL | Status: DC
  Administered 2018-10-31 – 2018-11-01 (×2): 12.5 mg via ORAL
  Filled 2018-10-31 (×2): qty 1

## 2018-10-31 MED ORDER — INSULIN ASPART 100 UNIT/ML ~~LOC~~ SOLN
3.0000 [IU] | Freq: Three times a day (TID) | SUBCUTANEOUS | Status: DC
  Administered 2018-10-31 (×3): 6 [IU] via SUBCUTANEOUS
  Administered 2018-11-01 (×2): 3 [IU] via SUBCUTANEOUS
  Administered 2018-11-02: 6 [IU] via SUBCUTANEOUS
  Administered 2018-11-02: 9 [IU] via SUBCUTANEOUS
  Administered 2018-11-02: 3 [IU] via SUBCUTANEOUS
  Administered 2018-11-03: 8 [IU] via SUBCUTANEOUS
  Administered 2018-11-03: 3 [IU] via SUBCUTANEOUS
  Administered 2018-11-03: 6 [IU] via SUBCUTANEOUS
  Administered 2018-11-04: 3 [IU] via SUBCUTANEOUS
  Administered 2018-11-04: 6 [IU] via SUBCUTANEOUS
  Administered 2018-11-04 (×2): 3 [IU] via SUBCUTANEOUS
  Administered 2018-11-05 (×2): 6 [IU] via SUBCUTANEOUS
  Administered 2018-11-05 – 2018-11-07 (×5): 3 [IU] via SUBCUTANEOUS
  Administered 2018-11-07: 6 [IU] via SUBCUTANEOUS

## 2018-10-31 MED ORDER — ORAL CARE MOUTH RINSE
15.0000 mL | Freq: Two times a day (BID) | OROMUCOSAL | Status: DC
  Administered 2018-10-31 – 2018-11-07 (×10): 15 mL via OROMUCOSAL

## 2018-10-31 MED ORDER — ACETAMINOPHEN 500 MG PO TABS
1000.0000 mg | ORAL_TABLET | ORAL | Status: DC | PRN
  Administered 2018-10-31 – 2018-11-01 (×3): 1000 mg via ORAL
  Filled 2018-10-31 (×3): qty 2

## 2018-10-31 MED ORDER — ENOXAPARIN SODIUM 30 MG/0.3ML ~~LOC~~ SOLN: 30.0000 mg | SUBCUTANEOUS | Status: DC

## 2018-10-31 MED ORDER — ENOXAPARIN SODIUM 80 MG/0.8ML ~~LOC~~ SOLN
80.0000 mg | SUBCUTANEOUS | Status: DC
  Administered 2018-10-31 – 2018-11-03 (×4): 80 mg via SUBCUTANEOUS
  Filled 2018-10-31 (×4): qty 0.8

## 2018-10-31 MED ORDER — POTASSIUM CHLORIDE CRYS ER 20 MEQ PO TBCR: 40.0000 meq | EXTENDED_RELEASE_TABLET | Freq: Once | ORAL | Status: DC

## 2018-10-31 MED ORDER — ROSUVASTATIN CALCIUM 20 MG PO TABS
40.0000 mg | ORAL_TABLET | Freq: Every day | ORAL | Status: DC
  Administered 2018-10-31 – 2018-11-06 (×7): 40 mg via ORAL
  Filled 2018-10-31 (×7): qty 2

## 2018-10-31 MED ORDER — SENNOSIDES-DOCUSATE SODIUM 8.6-50 MG PO TABS
1.0000 | ORAL_TABLET | Freq: Two times a day (BID) | ORAL | Status: DC
  Administered 2018-10-31 – 2018-11-07 (×15): 1 via ORAL
  Filled 2018-10-31 (×15): qty 1

## 2018-10-31 MED ORDER — ASPIRIN 81 MG PO CHEW
81.0000 mg | CHEWABLE_TABLET | Freq: Every day | ORAL | Status: DC
  Administered 2018-10-31 – 2018-11-07 (×7): 81 mg via ORAL
  Filled 2018-10-31 (×7): qty 1

## 2018-10-31 MED ORDER — POTASSIUM CHLORIDE CRYS ER 20 MEQ PO TBCR
40.0000 meq | EXTENDED_RELEASE_TABLET | Freq: Two times a day (BID) | ORAL | Status: DC
  Administered 2018-10-31 – 2018-11-01 (×4): 40 meq via ORAL
  Filled 2018-10-31 (×3): qty 2

## 2018-10-31 NOTE — Progress Notes (Signed)
Patient ID: Tamara Silva, female   DOB: Aug 21, 1960, 58 y.o.   MRN: [id number]     Advanced Heart Failure Rounding Note  PCP-Cardiologist: Quay Burow, MD   Subjective:    Had prolonged PEA arrest on 5/30. CPR for 25 minutes. Moved to 4N.   Extubated 6/1.    Breathing better today, chest still sore from CPR.  I/Os markedly negative but CVP still > 20.  Co-ox 78%.   Creatinine 1.74 -> 2.11 -> 2.21 -> 2.26.   Echo: EF 30-35%, mild RV dilation.    Objective:   Weight Range: (!) 148 kg Body mass index is 45.51 kg/m.   Vital Signs:   Temp:  [98.1 F (36.7 C)-99 F (37.2 C)] 98.1 F (36.7 C) (06/02 1230) Pulse Rate:  [65-77] 68 (06/02 1230) Resp:  [13-30] 17 (06/02 1230) BP: (86-154)/(32-73) 125/73 (06/02 1230) SpO2:  [93 %-100 %] 99 % (06/02 1230) Arterial Line BP: (120-165)/(41-58) 150/58 (06/02 0800) FiO2 (%):  [40 %] 40 % (06/02 0306) Weight:  [148 kg] 148 kg (06/02 0500) Last BM Date: 10/26/18  Weight change: Filed Weights   10/29/18 0423 10/30/18 0408 10/31/18 0500  Weight: (!) 152.1 kg (!) 147.5 kg (!) 148 kg    Intake/Output:   Intake/Output Summary (Last 24 hours) at 10/31/2018 1343 Last data filed at 10/31/2018 1200 Gross per 24 hour  Intake 748.19 ml  Output 6200 ml  Net -5451.81 ml      Physical Exam    General: NAD Neck: JVP difficult but appears elevated (thick neck), no thyromegaly or thyroid nodule.  Lungs: Clear to auscultation bilaterally with normal respiratory effort. CV: Nonpalpable PMI.  Heart regular S1/S2, no S3/S4, no murmur.  1+ edema to knees bilaterally.   Abdomen: Soft, nontender, no hepatosplenomegaly, mild distention.  Skin: Intact without lesions or rashes.  Neurologic: Alert and oriented x 3.  Psych: Normal affect. Extremities: No clubbing or cyanosis.  HEENT: Normal.    Telemetry   NSR 60s, personally reviewed.   Labs    CBC Recent Labs    10/28/18 1418 10/29/18 1144  HGB 10.5* 10.5*  HCT 31.0* 31.0*    Basic Metabolic Panel Recent Labs    10/29/18 0416  10/29/18 1739 10/30/18 0422 10/31/18 0511  NA 144   < >  --  143 145  K 3.9   < >  --  3.2* 3.5  CL 102  --   --  96* 89*  CO2 32  --   --  35* 39*  GLUCOSE 178*  --   --  221* 151*  BUN 66*  --   --  70* 71*  CREATININE 2.11*  --   --  2.21* 2.26*  CALCIUM 8.7*  --   --  9.0 9.7  MG 2.4  --   --  2.1  --   PHOS 3.7  --  2.8  --   --    < > = values in this interval not displayed.   Liver Function Tests No results for input(s): AST, ALT, ALKPHOS, BILITOT, PROT, ALBUMIN in the last 72 hours. No results for input(s): LIPASE, AMYLASE in the last 72 hours. Cardiac Enzymes Recent Labs    10/28/18 1533 10/28/18 2239  TROPONINI 0.10* 0.10*    BNP: BNP (last 3 results) Recent Labs    07/08/18 2100 10/25/18 0309 10/28/18 1023  BNP 669.1* 709.4* 1,423.5*    ProBNP (last 3 results) No results for input(s): PROBNP in the last 8760  hours.   D-Dimer No results for input(s): DDIMER in the last 72 hours. Hemoglobin A1C No results for input(s): HGBA1C in the last 72 hours. Fasting Lipid Panel Recent Labs    10/31/18 0511  TRIG 72   Thyroid Function Tests Recent Labs    10/28/18 1533  TSH 5.177*    Other results:   Imaging    Dg Chest Port 1 View  Result Date: 10/31/2018 CLINICAL DATA:  Pulmonary edema and asthma EXAM: PORTABLE CHEST 1 VIEW COMPARISON:  Two days ago FINDINGS: Interval tracheal and esophageal extubation. There is a scar-like appearance in the peripheral left upper lobe. Stable cardiomegaly. Stable vascular congestion/edema. Right upper extremity PICC with tip at the SVC. IMPRESSION: 1. Cardiomegaly and persistent vascular congestion/edema. 2. Stable inflation after extubation. Electronically Signed   By: Monte Fantasia M.D.   On: 10/31/2018 07:29     Medications:     Scheduled Medications: . aspirin  81 mg Oral Daily  . chlorhexidine gluconate (MEDLINE KIT)  15 mL Mouth Rinse BID  .  Chlorhexidine Gluconate Cloth  6 each Topical Daily  . ferrous gluconate  324 mg Oral Q breakfast  . insulin aspart  3 Units Subcutaneous TID AC  . insulin aspart  3-9 Units Subcutaneous TID AC & HS  . insulin detemir  8 Units Subcutaneous QHS  . ipratropium-albuterol  3 mL Nebulization TID  . isosorbide-hydrALAZINE  0.5 tablet Per Tube TID  . mouth rinse  15 mL Mouth Rinse BID  . pantoprazole  40 mg Oral Daily  . polyethylene glycol  17 g Per Tube Q M,W,F  . rosuvastatin  40 mg Oral q1800  . senna-docusate  1 tablet Oral BID  . spironolactone  12.5 mg Oral Daily    Infusions: . sodium chloride 10 mL/hr at 10/31/18 1200  . furosemide (LASIX) infusion 20 mg/hr (10/31/18 1200)    PRN Medications: sodium chloride, acetaminophen, albuterol, hydrALAZINE, midazolam, sodium chloride flush   Assessment/Plan   1. PEA arrest: Due to severe hypoxia on 5/30.  Now extubated, stable.  Chest pain likely due to CPR.  2. Acute on chronic systolic CHF: Suspect ischemic cardiomyopathy.  Echo in 1/20 with EF 40-45%, RV moderately dilated, PASP 71 mmHg.  Echo this admission with EF 30-35%, mildly dilated RV.  Suspect significant RV failure, may be related to OHS/OSA and COPD. She is massively volume overloaded and also likely has Bruce. CVP > 20 still, co-ox 78%.  Diuresed very well yesterday. Creatinine stable at 2.26.  - Continue Lasix 20 mg/hr, still marked volume overload.  - Off carvedilol - Continue Bidil 1/2 tab tid and spironolactone 12.5 daily.  3. AKI: Baseline creatinine around 1.3, has been 1.8-1.9 this admission, 2.26 today.  Suspect cardiorenal syndrome and possibly ATN from code.  Hopefully can see some improvement in creatinine as we lower renal venous pressure.  - Continue to diurese as above 4. Acute hypoxic respiratory failure: Post-arrest, extubated 6/1. Now on nasal cannula.  3. COPD: On home oxygen at baseline.  Has quit smoking.  4. OHS/OSA: On home oxygen at all times and Bipap at  night.  5. CAD: History of PCI, most recently had inferior STEMI in 5/15 with DES to RCA.  No chest pain, troponin not elevated.  - Continue ASA 81 and Crestor 40 daily - With fall in EF, will consider RHC/LHC after diuresis if we can stabilize her creatinine.  6. Remote PE: Restart Lovenox for DVT prophylaxis.   7. Anemia: History of  GI bleeding, had endoscopies earlier this year with no bleeding source found.  Also with history of B12 deficiency. Transferrin saturation low, got IV Fe.   8. Hypokalemia: Supplement.   Length of Stay: 6  Loralie Champagne, MD  10/31/2018, 1:43 PM  Advanced Heart Failure Team Pager [phone] (M-F; 7a - 4p)  Please contact [facility] for night-coverage after hours (4p -7a ) and weekends on [email or web address]

## 2018-10-31 NOTE — Progress Notes (Addendum)
NAMEKareen Silva, MRN:  [id number], DOB:  [birth year], LOS: 6 ADMISSION DATE:  10/25/2018, CONSULTATION DATE:  10/28/2018 REFERRING MD:  Dr. Reesa Chew, CHIEF COMPLAINT:  PEA arrest   Brief History   58 year old female former smoker with multiple comorbidities and recent hospitalization 05/2018 and 07/2018  for stroke, seizure and anemia re-admitted Oct 25, 2018 with progressive shortness of breath and edema found to be in decompensated combined congestive heart failure started on aggressive diuresis.  Morning of May 30th 2020 patient had neurological changes, taken for a CT head.  While in CT patient become obtunded with loss of pulse.  PEA arrest.  CPR was started- x 25 min , epi x 4 and Sodium Bicarb x 2 . Patient was intubated and transported to ICU . PCCM consulted.   Past Medical History  Diabetes, OHS, OSA, COPD, CAD, CHF, anemia  Significant Hospital Events   10/28/2018 early a.m. with mental status changes and found unresponsive.  Taken to CT, arrest requiring neuro and ACLS measures.  Transported to ICU 6/1 successfully extubated, Lasix decreased due to rising creatinine 6/2: Looking well.  Down 13 L.  Breathing easier.  Serum creatinine bumped only slightly so Lasix held at 20 mg an hour.  Ready for transfer out of the intensive care Consults:  Cardiology 5/28  PCCM 5/30  Neurology 5/30  Procedures:  5/29 PICC L  5/30 ETT  extubated 6/1 Significant Diagnostic Tests:  Echo 5/28 EF 30 to 35%, left ventricle diffuse hypokinesis right ventricle mildly enlarged, right atrial pressure 10 mmHg  Micro Data:  COVID negative  Antimicrobials:  none   Interim history/subjective:  No distress, weaning oxygen  Objective   Blood pressure 131/67, pulse 69, temperature 98.1 F (36.7 C), temperature source Bladder, resp. rate (Abnormal) 22, height 5\' 11"  (1.803 m), weight (Abnormal) 148 kg, last menstrual period 07/28/2013, SpO2 99 %. CVP:  [24 mmHg] 24 mmHg  Vent Mode: BIPAP FiO2 (%):   [40 %] 40 % Set Rate:  [14 bmp-25 bmp] 14 bmp Vt Set:  [420 mL] 420 mL PEEP:  [5 cmH20-8 cmH20] 6 cmH20 Plateau Pressure:  [27 cmH20] 27 cmH20   Intake/Output Summary (Last 24 hours) at 10/31/2018 0834 Last data filed at 10/31/2018 0800 Gross per 24 hour  Intake 808.42 ml  Output 8300 ml  Net -7491.58 ml   Filed Weights   10/29/18 0423 10/30/18 0408 10/31/18 0500  Weight: (Abnormal) 152.1 kg (Abnormal) 147.5 kg (Abnormal) 148 kg    Examination: General: This pleasant 58 year old female she is resting in bed and in no acute distress HEENT normocephalic atraumatic no jugular venous distention mucous membranes are moist Pulmonary: Some scattered rhonchi no accessory use currently on 5-1/2 L via nasal cannula Cardiac: Regular rate and rhythm Abdomen: Soft, not tender, no organomegaly Extremities: Warm and dry, lower extremities wrapped.  Dependent edema appreciated. Neuro: Awake oriented no focal deficits.  Resolved Hospital Problem list   Acute metabolic encephalopathy, possible seizure; all resolved as of 6/1.  Completely alert and oriented  Assessment & Plan:  Acute on chronic hypoxic and hypercarbic respiratory failure status post PEA arrest 10/28/2018; COPD,OSA/OHS-on nocturnal BiPAP and oxygen at home.  Seemingly etiology secondary to pulmonary edema from decompensated heart failure -Portable chest x-ray personally reviewed:Bilateral pulmonary edema persists.  Marked cardiomegaly.  Endotracheal tube now removed, right PICC line unremarkable -Extubated 6/1, continues to improve, weaning oxygen.  Now at -13 L Plan  Continue to wean oxygen Mandatory BiPAP at at bedtime and as needed  Pulse oximetry goal greater than 92% Mobilize No sedating medications Continuing diuresis, serum creatinine seems to be holding fairly stable compared to 6/1, will hold Lasix at current dosing 20 mg an hour Repeat a.m. chest x-ray  Acutely decompensated combined diastolic and systolic heart failure  with ejection fraction at 40% (echo this admission) PEA arrest 10/28/2018 Hemodynamics are stable, now 5.6 L negative Plan Continue telemetry monitoring  Continuing IV Lasix RN Apresoline for systolic blood pressure greater than 140 Continue daily aspirin Continue Aldactone and Crestor Additional recommendations per cardiology service  acute kidney injury, CKD stage III Baseline serum creatinine 1.3, on admission 1.8; continues to rise but minimal rise since yesterday Plan Continue current Lasix dosing May need to stop the Lasix on 6/3 if creatinine continues to climb Serial chemistries Intake/Outpu t  Fluid and electrolyte imbalance: Hypokalemia Plan Place potassium recheck  Diabetes Morbid obesity Plan Changing insulin to [facility] at bedtime Continue basal dosing  Anemia of chronic disease Plan A.m. CBC  Best practice:  Diet: tube feed Pain/Anxiety/Delirium protocol (if indicated): n/a VAP protocol (if indicated): in place DVT prophylaxis: lovenox, GI prophylaxis: PPI, HOB >30 Glucose control: SSI, LA insulin Mobility: bed Code Status: full Family Communication: will contact Disposition:  We will move her to stepdown setting asked hospitalist to assume her care she is clinically improved primary goal at this point will be to continue to push diuresis as long as BUN and creatinine allow, and optimize cardiac recipe in regards to medical therapy for her heart failure.  She says she is compliant with her BiPAP, we will need to ensure she is wearing this at night  Erick Colace ACNP-BC [place] Pager # [phone] OR # [phone] if no answer  Patient seen and examined with Nurse Practitioner.  Agree with their assessment and plan as written.  Patient looks great off ventilator!  She has some mild generalized leg pain but otherwise feels she is getting closer to baseline with her breathing.  On exam, she is obese, has diminished breath sounds at  bases, malampatti 4 airway, ext warm, improving LE edema.  She is here with acute on chronic biventricular failure complicated by cardiac arrest with fortunately no obvious neurologic sequelae.  Further care will include progressive mobilization, continued diuresis as tolerated by renal function, and nightly BIPAP use.  Plan of care discussed with patient at time of evaluation.

## 2018-10-31 NOTE — Progress Notes (Signed)
Patient transferred to 2C04.  Patient belongings sent down with patient along.  Slippers found in room after transfer and were tube down to [place] confirmed by [facility] RN.

## 2018-11-01 LAB — COMPREHENSIVE METABOLIC PANEL
ALT: 16 U/L (ref 0–44)
AST: 27 U/L (ref 15–41)
Albumin: 2.9 g/dL — ABNORMAL LOW (ref 3.5–5.0)
Alkaline Phosphatase: 111 U/L (ref 38–126)
Anion gap: 12 (ref 5–15)
BUN: 75 mg/dL — ABNORMAL HIGH (ref 6–20)
CO2: 46 mmol/L — ABNORMAL HIGH (ref 22–32)
Calcium: 9.5 mg/dL (ref 8.9–10.3)
Chloride: 86 mmol/L — ABNORMAL LOW (ref 98–111)
Creatinine, Ser: 2.26 mg/dL — ABNORMAL HIGH (ref 0.44–1.00)
GFR calc Af Amer: 27 mL/min — ABNORMAL LOW (ref >60)
GFR calc non Af Amer: 23 mL/min — ABNORMAL LOW (ref >60)
Glucose, Bld: 122 mg/dL — ABNORMAL HIGH (ref 70–99)
Potassium: 3.1 mmol/L — ABNORMAL LOW (ref 3.5–5.1)
Sodium: 144 mmol/L (ref 135–145)
Total Bilirubin: 1.6 mg/dL — ABNORMAL HIGH (ref 0.3–1.2)
Total Protein: 9 g/dL — ABNORMAL HIGH (ref 6.5–8.1)

## 2018-11-01 LAB — GLUCOSE, CAPILLARY
Glucose-Capillary: 106 mg/dL — ABNORMAL HIGH (ref 70–99)
Glucose-Capillary: 125 mg/dL — ABNORMAL HIGH (ref 70–99)
Glucose-Capillary: 132 mg/dL — ABNORMAL HIGH (ref 70–99)
Glucose-Capillary: 128 mg/dL — ABNORMAL HIGH (ref 70–99)

## 2018-11-01 LAB — CBC
HCT: 29.3 % — ABNORMAL LOW (ref 36.0–46.0)
Hemoglobin: 9 g/dL — ABNORMAL LOW (ref 12.0–15.0)
MCH: 26.2 pg (ref 26.0–34.0)
MCHC: 30.7 g/dL (ref 30.0–36.0)
MCV: 85.2 fL (ref 80.0–100.0)
Platelets: 188 10*3/uL (ref 150–400)
RBC: 3.44 MIL/uL — ABNORMAL LOW (ref 3.87–5.11)
RDW: 17.5 % — ABNORMAL HIGH (ref 11.5–15.5)
WBC: 7.5 10*3/uL (ref 4.0–10.5)
nRBC: 0.4 % — ABNORMAL HIGH (ref 0.0–0.2)

## 2018-11-01 LAB — BASIC METABOLIC PANEL
Anion gap: 10 (ref 5–15)
BUN: 69 mg/dL — ABNORMAL HIGH (ref 6–20)
CO2: 49 mmol/L — ABNORMAL HIGH (ref 22–32)
Calcium: 9.8 mg/dL (ref 8.9–10.3)
Chloride: 80 mmol/L — ABNORMAL LOW (ref 98–111)
Creatinine, Ser: 2.28 mg/dL — ABNORMAL HIGH (ref 0.44–1.00)
GFR calc Af Amer: 27 mL/min — ABNORMAL LOW (ref >60)
GFR calc non Af Amer: 23 mL/min — ABNORMAL LOW (ref >60)
Glucose, Bld: 242 mg/dL — ABNORMAL HIGH (ref 70–99)
Potassium: 3.2 mmol/L — ABNORMAL LOW (ref 3.5–5.1)
Sodium: 139 mmol/L (ref 135–145)

## 2018-11-01 LAB — COOXEMETRY PANEL
Carboxyhemoglobin: 1.8 % — ABNORMAL HIGH (ref 0.5–1.5)
Methemoglobin: 1.1 % (ref 0.0–1.5)
O2 Saturation: 80.7 %
Total hemoglobin: 10.7 g/dL — ABNORMAL LOW (ref 12.0–16.0)

## 2018-11-01 MED ORDER — METOLAZONE 2.5 MG PO TABS
2.5000 mg | ORAL_TABLET | Freq: Once | ORAL | Status: AC
  Administered 2018-11-01: 2.5 mg via ORAL
  Filled 2018-11-01: qty 1

## 2018-11-01 MED ORDER — PHENOL 1.4 % MT LIQD
1.0000 | OROMUCOSAL | Status: DC | PRN
  Filled 2018-11-01: qty 177

## 2018-11-01 MED ORDER — POTASSIUM CHLORIDE CRYS ER 20 MEQ PO TBCR
40.0000 meq | EXTENDED_RELEASE_TABLET | Freq: Two times a day (BID) | ORAL | Status: DC
  Filled 2018-11-01: qty 2

## 2018-11-01 MED ORDER — ENSURE MAX PROTEIN PO LIQD
11.0000 [oz_av] | Freq: Two times a day (BID) | ORAL | Status: DC
  Administered 2018-11-01 – 2018-11-07 (×9): 11 [oz_av] via ORAL
  Filled 2018-11-01 (×14): qty 330

## 2018-11-01 NOTE — Progress Notes (Signed)
Inpatient Diabetes Program Recommendations  AACE/ADA: New Consensus Statement on Inpatient Glycemic Control (2015)  Target Ranges:  Prepandial:   less than 140 mg/dL      Peak postprandial:   less than 180 mg/dL (1-2 hours)      Critically ill patients:  140 - 180 mg/dL   Lab Results  Component Value Date   GLUCAP 106 (H) 11/01/2018   HGBA1C 7.1 (H) 06/01/2018    Review of Glycemic Control  Diabetes history: DM 2 Outpatient Diabetes medications: Levemir 18 units Daily  Current orders for Inpatient glycemic control:  ICU order set Levemir 8 units Novolog 3-9 units tid + hs Novolog 3 units tid meal coverage   Inpatient Diabetes Program Recommendations:  D/c current correction scale, Consider regular glycemic control order set Novolog MODERATE scale 0-15 units tid + Novolog 0-5 units qhs for bedtime coverage.  Thanks,  Tama Headings RN, MSN, BC-ADM Inpatient Diabetes Coordinator Team Pager [phone] (8a-5p)

## 2018-11-01 NOTE — Progress Notes (Signed)
Patient requested to be placed back on BIPAP.

## 2018-11-01 NOTE — Progress Notes (Signed)
RT found pt already on BIPAP for the night. Pt order is QHS and PRN as well. Pt resting comfortably with stable respiratory status at this time. Pt is on BIPAP V60 16/6, BUR 14, FIO2 40%. RT will continue to monitor.

## 2018-11-01 NOTE — Progress Notes (Signed)
Physical Therapy Treatment Patient Details Name: Tamara Silva MRN: [id number] DOB: [birth year] Today's Date: 11/01/2018    History of Present Illness Pt is a 58 yo female s/p SOB, edema with a negative CT of chest , 4L O2 dependent. PMHx: recent CVA with seizure requiring intubation, EF of 40-50%, DMT2, chronic kidney dx, CAD, obesity, presents to the ER because of increasing shortness of breath over the last few days with increasing peripheral edema. Pt with seizure on 5/30, PEA arrest while in CT requring extensive CPR and intubated from 5/30-6/1.     PT Comments    Pt motivated but very sore post CPR.  She pushed herself to transfer without assist and only close guard.  Emphasis on transitions, sit to stand, standing activity at the sink and gait with the RW   Follow Up Recommendations  SNF;Other (comment)(but pt is refusing and wants to go straight home with HHPT)     Equipment Recommendations  None recommended by PT    Recommendations for Other Services       Precautions / Restrictions Precautions Precautions: Fall Precaution Comments: watch 02 Restrictions Weight Bearing Restrictions: No    Mobility  Bed Mobility Overal bed mobility: Needs Assistance Bed Mobility: Sit to Supine       Sit to supine: Mod assist   General bed mobility comments: assist for LEs back into bed  Transfers Overall transfer level: Needs assistance Equipment used: Rolling walker (2 wheeled) Transfers: Sit to/from Stand Sit to Stand: Min guard         General transfer comment: increased time and effort  Ambulation/Gait Ambulation/Gait assistance: Min assist Gait Distance (Feet): 40 Feet(then 15 feet to/from sink) Assistive device: Rolling walker (2 wheeled) Gait Pattern/deviations: Step-through pattern     General Gait Details: mostly steady, noticeable dyspnea, sats on 2 liters with gait 86-90%, up to 94% on 4L  during walkiing   Stairs             Wheelchair  Mobility    Modified Rankin (Stroke Patients Only)       Balance Overall balance assessment: Needs assistance   Sitting balance-Leahy Scale: Fair     Standing balance support: Bilateral upper extremity supported;No upper extremity supported Standing balance-Leahy Scale: Fair Standing balance comment: patient able to wash hands at sink without support.                            Cognition Arousal/Alertness: Awake/alert Behavior During Therapy: Flat affect;Anxious Overall Cognitive Status: Within Functional Limits for tasks assessed                                        Exercises      General Comments        Pertinent Vitals/Pain Pain Assessment: Faces Faces Pain Scale: Hurts even more Pain Location: chest from compressions Pain Descriptors / Indicators: Sore Pain Intervention(s): Monitored during session    Home Living Family/patient expects to be discharged to:: Private residence Living Arrangements: Spouse/significant other;Other relatives(daughter) Available Help at Discharge: Family;Available 24 hours/day Type of Home: House Home Access: Stairs to enter Entrance Stairs-Rails: None Home Layout: One level Home Equipment: Cane - single point;Bedside commode;Walker - 2 wheels      Prior Function Level of Independence: Needs assistance  Gait / Transfers Assistance Needed: Ambulates short distances/household distances with RW and w/c on  the porch and in the community ADL's / Homemaking Assistance Needed: modified independent with UB ADL and requiring assist for LB ADL     PT Goals (current goals can now be found in the care plan section) Acute Rehab PT Goals Patient Stated Goal: to go home PT Goal Formulation: With patient Time For Goal Achievement: 11/09/18 Potential to Achieve Goals: Good    Frequency    Min 3X/week      PT Plan      Co-evaluation              AM-PAC PT "6 Clicks" Mobility   Outcome Measure   Help needed turning from your back to your side while in a flat bed without using bedrails?: A Little Help needed moving from lying on your back to sitting on the side of a flat bed without using bedrails?: A Little Help needed moving to and from a bed to a chair (including a wheelchair)?: A Little Help needed standing up from a chair using your arms (e.g., wheelchair or bedside chair)?: A Little Help needed to walk in hospital room?: A Little Help needed climbing 3-5 steps with a railing? : A Lot 6 Click Score: 17    End of Session   Activity Tolerance: Patient tolerated treatment well(some fatigue) Patient left: in bed;with call bell/phone within reach;with nursing/sitter in room Nurse Communication: Mobility status PT Visit Diagnosis: Other abnormalities of gait and mobility (R26.89);Muscle weakness (generalized) (M62.81);Difficulty in walking, not elsewhere classified (R26.2)     Time: 3762-8315 PT Time Calculation (min) (ACUTE ONLY): 42 min  Charges:  $Gait Training: 8-22 mins                     11/01/2018  Donnella Sham, PT Acute Rehabilitation Services [phone]  (pager) [phone]  (office)   Tamara Silva 11/01/2018, 5:10 PM

## 2018-11-01 NOTE — Progress Notes (Signed)
Patient ID: Tamara Silva, female   DOB: [birth year], 58 y.o.   MRN: [id number]     Advanced Heart Failure Rounding Note  PCP-Cardiologist: Quay Burow, MD   Subjective:    Had prolonged PEA arrest on 5/30. CPR for 25 minutes. Moved to 4N.   Extubated 6/1.    Breathing better today, chest still sore from CPR. I/Os very negative with weight down but CVP still 24-25.  Co-ox 80%.   Creatinine 1.74 -> 2.11 -> 2.21 -> 2.26 -> 2.26.    Echo: EF 30-35%, mild RV dilation.    Objective:   Weight Range: (!) 139.8 kg Body mass index is 42.99 kg/m.   Vital Signs:   Temp:  [96.8 F (36 C)-98.4 F (36.9 C)] 97.6 F (36.4 C) (06/03 0809) Pulse Rate:  [57-73] 62 (06/03 0809) Resp:  [13-23] 20 (06/03 0809) BP: (99-130)/(46-89) 127/74 (06/03 0809) SpO2:  [93 %-100 %] 100 % (06/03 0809) FiO2 (%):  [40 %] 40 % (06/03 0825) Weight:  [139.8 kg] 139.8 kg (06/03 0509) Last BM Date: 10/28/18(per patient)  Weight change: Filed Weights   10/30/18 0408 10/31/18 0500 11/01/18 0509  Weight: (!) 147.5 kg (!) 148 kg (!) 139.8 kg    Intake/Output:   Intake/Output Summary (Last 24 hours) at 11/01/2018 0835 Last data filed at 11/01/2018 0826 Gross per 24 hour  Intake 750.62 ml  Output 5675 ml  Net -4924.38 ml      Physical Exam    General: NAD Neck: Thick, JVP 16, no thyromegaly or thyroid nodule.  Lungs: Decreased BS at bases.  CV: Nonpalpable PMI.  Heart regular S1/S2, no S3/S4, no murmur. 1+ edema to knees.   Abdomen: Soft, nontender, no hepatosplenomegaly, mild distention.  Skin: Intact without lesions or rashes.  Neurologic: Alert and oriented x 3.  Psych: Normal affect. Extremities: No clubbing or cyanosis.  HEENT: Normal.    Telemetry   NSR 60s, personally reviewed.   Labs    CBC Recent Labs    10/31/18 1405 11/01/18 0320  WBC 8.9 7.5  HGB 9.1* 9.0*  HCT 29.4* 29.3*  MCV 85.5 85.2  PLT 190 035   Basic Metabolic Panel Recent Labs    10/29/18 1739  10/30/18  0422 10/31/18 0511 10/31/18 1405 11/01/18 0320  NA  --   --  143 145  --  144  K  --   --  3.2* 3.5  --  3.1*  CL  --    < > 96* 89*  --  86*  CO2  --    < > 35* 39*  --  46*  GLUCOSE  --    < > 221* 151*  --  122*  BUN  --    < > 70* 71*  --  75*  CREATININE  --    < > 2.21* 2.26* 2.25* 2.26*  CALCIUM  --    < > 9.0 9.7  --  9.5  MG  --   --  2.1  --   --   --   PHOS 2.8  --   --   --   --   --    < > = values in this interval not displayed.   Liver Function Tests Recent Labs    11/01/18 0320  AST 27  ALT 16  ALKPHOS 111  BILITOT 1.6*  PROT 9.0*  ALBUMIN 2.9*   No results for input(s): LIPASE, AMYLASE in the last 72 hours. Cardiac Enzymes No results  for input(s): CKTOTAL, CKMB, CKMBINDEX, TROPONINI in the last 72 hours.  BNP: BNP (last 3 results) Recent Labs    07/08/18 2100 10/25/18 0309 10/28/18 1023  BNP 669.1* 709.4* 1,423.5*    ProBNP (last 3 results) No results for input(s): PROBNP in the last 8760 hours.   D-Dimer No results for input(s): DDIMER in the last 72 hours. Hemoglobin A1C No results for input(s): HGBA1C in the last 72 hours. Fasting Lipid Panel Recent Labs    10/31/18 0511  TRIG 72   Thyroid Function Tests No results for input(s): TSH, T4TOTAL, T3FREE, THYROIDAB in the last 72 hours.  Invalid input(s): FREET3  Other results:   Imaging    No results found.   Medications:     Scheduled Medications: . aspirin  81 mg Oral Daily  . enoxaparin (LOVENOX) injection  80 mg Subcutaneous Q24H  . ferrous gluconate  324 mg Oral Q breakfast  . insulin aspart  3 Units Subcutaneous TID AC  . insulin aspart  3-9 Units Subcutaneous TID AC & HS  . insulin detemir  8 Units Subcutaneous QHS  . ipratropium-albuterol  3 mL Nebulization TID  . isosorbide-hydrALAZINE  0.5 tablet Per Tube TID  . mouth rinse  15 mL Mouth Rinse BID  . metolazone  2.5 mg Oral Once  . pantoprazole  40 mg Oral Daily  . polyethylene glycol  17 g Per Tube Q M,W,F   . potassium chloride  40 mEq Oral BID  . rosuvastatin  40 mg Oral q1800  . senna-docusate  1 tablet Oral BID  . spironolactone  12.5 mg Oral Daily    Infusions: . sodium chloride Stopped (10/31/18 2015)  . furosemide (LASIX) infusion 20 mg/hr (11/01/18 0300)    PRN Medications: sodium chloride, acetaminophen, albuterol, hydrALAZINE, sodium chloride flush   Assessment/Plan   1. PEA arrest: Due to severe hypoxia on 5/30.  Now extubated, stable.  Chest pain likely due to CPR.  2. Acute on chronic systolic CHF: Suspect ischemic cardiomyopathy.  Echo in 1/20 with EF 40-45%, RV moderately dilated, PASP 71 mmHg.  Echo this admission with EF 30-35%, mildly dilated RV.  Suspect significant RV failure, may be related to OHS/OSA and COPD. She is massively volume overloaded and also likely has [place]. CVP > 20 still, co-ox 80%.  Diuresed very well yesterday. Creatinine stable at 2.26.  - Continue Lasix 20 mg/hr, still marked volume overload. Will add a dose of metolazone 2.5 x 1.  - She will need aggressive K repletion today, discussed with pharmacist.  - Off carvedilol - Continue Bidil 1/2 tab tid and spironolactone 12.5 daily.  3. AKI: Baseline creatinine around 1.3, has been 1.8-1.9 this admission, 2.26 today but stable.  Suspect cardiorenal syndrome and possibly ATN from code.  Hopefully can see some improvement in creatinine as we lower renal venous pressure.  - Continue to diurese as above 4. Acute hypoxic respiratory failure: Post-arrest, extubated 6/1. Now on nasal cannula.  3. COPD: On home oxygen at baseline.  Has quit smoking.  4. OHS/OSA: On home oxygen at all times and Bipap at night.  5. CAD: History of PCI, most recently had inferior STEMI in 5/15 with DES to RCA.  She has been having chest pain but chest wall is tender post-CPR, doubt ischemia.  - Continue ASA 81 and Crestor 40 daily - With fall in EF, will consider RHC/LHC after diuresis if we can stabilize her creatinine.  6.  Remote PE: Continue Lovenox for DVT prophylaxis.  7. Anemia: History of GI bleeding, had endoscopies earlier this year with no bleeding source found.  Also with history of B12 deficiency. Transferrin saturation low, got IV Fe.   8. Hypokalemia: Supplement aggressively.   Length of Stay: 7  Loralie Champagne, MD  11/01/2018, 8:35 AM  Advanced Heart Failure Team Pager [phone] (M-F; 7a - 4p)  Please contact [facility] for night-coverage after hours (4p -7a ) and weekends on [email or web address]

## 2018-11-01 NOTE — Progress Notes (Signed)
PROGRESS NOTE    Tamara Silva  VWP:[id number] DOB: Nov 10, 1960 DOA: 10/25/2018 PCP: Antony Blackbird, MD  Brief Narrative: 58 year old with a history of systolic CHF ejection fraction 40-45%- January 2020, obesity hypoventilation on BiPAP at bedtime, diabetes mellitus type 2, CKD, CAD, recent CVA requiring intubation for seizure presented for increasing shortness of breath and peripheral edema.  Chest x-ray overall unremarkable.  BNP slightly elevated, creatinine elevated from baseline of 1.3 to1.8.  Started on IV diuretics.  Also some component of COPD exacerbation. Cardiology consulted give her adv cardiac issues. Due to signficant amount of overload, PICC was placed to monitor CVP and CoOx, started on Lasix drip.  On 5/30 morning patient suddenly became unresponsive and concerns for seizure which lasted for more than 20 minutes.  She was given 2 mg of IV Ativan and CT of the head was ordered.  While she was getting CT of the head, patient became hypoxic leading to PEA cardiac arrest.  1 round of CPR was performed with regain of pulse but soon after went back again cardiac arrest requiring another round of CPR.  She was transferred to ICU after being intubated.  Neurology and critical care team were consulted. Patient extubated successfully 10/30/18. Transferred back to medicine/cardiology teams for ongoing management and diuresis.  Assessment & Plan:   Principal Problem:   Acute on chronic diastolic CHF (congestive heart failure) (HCC) Active Problems:   Morbid obesity (HCC)   CAD S/P percutaneous coronary angioplasty - prior PCI to LAD; RCA PCI: new Xience [place] DES 2.75 mm x 15 mm    COPD exacerbation (HCC)   CKD (chronic kidney disease), stage II   Acute respiratory failure with hypoxia (HCC)   Uncontrolled type 2 diabetes mellitus without complication, with long-term current use of insulin (HCC)   OSA (obstructive sleep apnea)   Tobacco abuse disorder   Hypertension   Iron deficiency anemia    Diabetes mellitus type 2 in obese (HCC)   Slow transit constipation   Cardiac arrest ([place])   Seizure ([place])  Acute hypoxic hypercarbic respiratory failure previously requiring intubation, successfully extubated 10/30/2018 Cardiac arrest, likely PEA arrest with spontaneous return of circulation Acute metabolic encephalopathy, resolved questionably seizure-like activity versus hypoxia and hypercarbia as above -Patient now extubated, mental status returned back to baseline without overt deficits -Neurology continues to follow appreciate insight and recommendations, -EEG shows moderate global slowing indicating a moderate global encephalopathy - without epileptiform discharges -Patient continues to require Bipap HS and PRN; continue to wean oxygen as tolerated - pt reports 4L [place] use at home -Cardiology/heart failure team following along as below  Acute combined diastolic and systolic congestive heart failure with ejection fraction 37% grade 2 diastolic dysfunction, class III -Heart failure team continues to follow, we appreciate insight and recommendations  -CVP remains elevated.  Continue Lasix and metolazone per cardiology -Continues to diurese well -I/O reported 17L net negative since admission -Continue BiPAP at night and as needed -Concurrent hypokalemia being repleted daily as appropriate  Acute kidney injury on CKD stage III; suspect Cardiorenal.  -Baseline creatinine around 1.3, -Creatinine now stabilized, previously uptrending, currently stable at around 2.2 -Lasix previously decreased in the ICU, continue to follow daily labs -Possible kidney injury secondary to PEA arrest and hypotension as above  Hypokalemia  -Secondary to above aggressive diuresis, replete as appropriate  Diabetes mellitus type 2 -On insulin sliding scale, Levemir 18 units daily.  NovoLog 2 units pre-meals  Obesity hypoventilation syndrome on BiPAP at bedtime -Supportive care.  Lengthy discussion at  bedside about need for dietary improvement and exercise regimen  Anemia of chronic disease, Fe deficiency, and B12 deficiency -Continue iron, B12 supplementation  History of CVA complicated by seizure -On aspirin and statin  History of CAD status post stenting -On aspirin statin and Coreg  Essential hypertension -Continue Coreg  Daily tobacco use -Counseled to quit smoking   DVT prophylaxis: Lovenox Code Status: Full Disposition Plan: Pending clinical improvement patient continues to be markedly overloaded with admitted creatinine, hypoxia difficulty with ambulation.  Suspect an additional 48 to 72 hours of aggressive IV diuresis, close monitoring telemetry, supplemental oxygen and physical therapy.  Consultants:   Neurology, neurology  Subjective: No acute issues or events overnight, rating BiPAP well currently, diet improving somewhat over the past 24 hours, denies fevers, chills, shortness of breath, headache, nausea, vomiting, diarrhea.  Does report ongoing musculoskeletal chest pain worse with inspiration.  Objective: Vitals:   11/01/18 1131 11/01/18 1141 11/01/18 1142 11/01/18 1233  BP:   111/65   Pulse: (!) 59     Resp: 20   20  Temp:  97.9 F (36.6 C)    TempSrc:  Axillary    SpO2: 100%     Weight:      Height:        Intake/Output Summary (Last 24 hours) at 11/01/2018 1313 Last data filed at 11/01/2018 1235 Gross per 24 hour  Intake 841.14 ml  Output 5000 ml  Net -4158.86 ml   Filed Weights   10/30/18 0408 10/31/18 0500 11/01/18 0509  Weight: (!) 147.5 kg (!) 148 kg (!) 139.8 kg    Examination:  General:  Pleasantly resting in bed, No acute distress. HEENT:  Normocephalic atraumatic. Sclerae nonicteric, noninjected. Extraocular movements intact bilaterally. Neck: Without mass or deformity. Trachea is midline. Lungs: Clear to auscultate bilaterally without rhonchi, wheeze, or rales. Heart: Regular rate and rhythm. Without murmurs, rubs, or  gallops. Abdomen: Soft, nontender, nondistended. Without guarding or rebound. Extremities: Without cyanosis, clubbing, edema, or obvious deformity. Vascular: Dorsalis pedis and posterior tibial pulses palpable bilaterally. Skin: Warm and dry, no erythema, no ulcerations.  Picc line right upper extremity.    Data Reviewed: I have personally reviewed following labs and imaging studies  CBC: Recent Labs  Lab 10/26/18 0013 10/28/18 1023 10/28/18 1031 10/28/18 1418 10/29/18 1144 10/31/18 1405 11/01/18 0320  WBC 5.7 9.3  --   --   --  8.9 7.5  NEUTROABS  --  7.5  --   --   --   --   --   HGB 9.8* 9.9* 11.2* 10.5* 10.5* 9.1* 9.0*  HCT 32.2* 33.3* 33.0* 31.0* 31.0* 29.4* 29.3*  MCV 86.3 88.6  --   --   --  85.5 85.2  PLT 216 209  --   --   --  190 885   Basic Metabolic Panel: Recent Labs  Lab 10/27/18 0314 10/28/18 0410 10/28/18 1023  10/28/18 1533 10/29/18 0416 10/29/18 1144 10/29/18 1739 10/30/18 0422 10/31/18 0511 10/31/18 1405 11/01/18 0320  NA 141 143 145   < >  --  144 146*  --  143 145  --  144  K 4.2 3.9 4.0   < >  --  3.9 3.8  --  3.2* 3.5  --  3.1*  CL 105 101 103  --   --  102  --   --  96* 89*  --  86*  CO2 27 31 34*  --   --  32  --   --  35* 39*  --  46*  GLUCOSE 119* 170* 167*  --   --  178*  --   --  221* 151*  --  122*  BUN 60* 58* 59*  --   --  66*  --   --  70* 71*  --  75*  CREATININE 1.79* 1.54* 1.74*  --   --  2.11*  --   --  2.21* 2.26* 2.25* 2.26*  CALCIUM 9.0 9.2 8.8*  --   --  8.7*  --   --  9.0 9.7  --  9.5  MG 2.5* 2.4 2.4  --   --  2.4  --   --  2.1  --   --   --   PHOS  --   --   --   --  4.6 3.7  --  2.8  --   --   --   --    < > = values in this interval not displayed.   GFR: Estimated Creatinine Clearance: 42.1 mL/min (A) (by C-G formula based on SCr of 2.26 mg/dL (H)). Liver Function Tests: Recent Labs  Lab 10/26/18 1220 10/28/18 1023 11/01/18 0320  AST 21 25 27   ALT 16 18 16   ALKPHOS 124 112 111  BILITOT 0.7 1.1 1.6*  PROT  9.0* 9.1* 9.0*  ALBUMIN 3.1* 3.0* 2.9*   No results for input(s): LIPASE, AMYLASE in the last 168 hours. Recent Labs  Lab 10/28/18 1533  AMMONIA 63*   Coagulation Profile: No results for input(s): INR, PROTIME in the last 168 hours. Cardiac Enzymes: Recent Labs  Lab 10/25/18 1553 10/26/18 0013 10/28/18 1023 10/28/18 1533 10/28/18 2239  TROPONINI <0.03 <0.03 0.03* 0.10* 0.10*   BNP (last 3 results) No results for input(s): PROBNP in the last 8760 hours. HbA1C: No results for input(s): HGBA1C in the last 72 hours. CBG: Recent Labs  Lab 10/31/18 1124 10/31/18 1620 10/31/18 2126 11/01/18 0633 11/01/18 1143  GLUCAP 203* 188* 165* 106* 125*   Lipid Profile: Recent Labs    10/30/18 0422 10/31/18 0511  TRIG 100 72   Thyroid Function Tests: No results for input(s): TSH, T4TOTAL, FREET4, T3FREE, THYROIDAB in the last 72 hours. Anemia Panel: No results for input(s): VITAMINB12, FOLATE, FERRITIN, TIBC, IRON, RETICCTPCT in the last 72 hours. Sepsis Labs: Recent Labs  Lab 10/28/18 1023 10/28/18 1533 10/28/18 1727  LATICACIDVEN 1.3 0.9 0.9    Recent Results (from the past 240 hour(s))  SARS Coronavirus 2 (CEPHEID - Performed in [facility] lab), Hosp Order     Status: None   Collection Time: 10/25/18  3:33 AM  Result Value Ref Range Status   SARS Coronavirus 2 NEGATIVE NEGATIVE Final    Comment: (NOTE) If result is NEGATIVE SARS-CoV-2 target nucleic acids are NOT DETECTED. The SARS-CoV-2 RNA is generally detectable in upper and lower  respiratory specimens during the acute phase of infection. The lowest  concentration of SARS-CoV-2 viral copies this assay can detect is 250  copies / mL. A negative result does not preclude SARS-CoV-2 infection  and should not be used as the sole basis for treatment or other  patient management decisions.  A negative result may occur with  improper specimen collection / handling, submission of specimen other  than  nasopharyngeal swab, presence of viral mutation(s) within the  areas targeted by this assay, and inadequate number of viral copies  (<250 copies / mL). A negative result must  be combined with clinical  observations, patient history, and epidemiological information. If result is POSITIVE SARS-CoV-2 target nucleic acids are DETECTED. The SARS-CoV-2 RNA is generally detectable in upper and lower  respiratory specimens dur ing the acute phase of infection.  Positive  results are indicative of active infection with SARS-CoV-2.  Clinical  correlation with patient history and other diagnostic information is  necessary to determine patient infection status.  Positive results do  not rule out bacterial infection or co-infection with other viruses. If result is PRESUMPTIVE POSTIVE SARS-CoV-2 nucleic acids MAY BE PRESENT.   A presumptive positive result was obtained on the submitted specimen  and confirmed on repeat testing.  While 2019 novel coronavirus  (SARS-CoV-2) nucleic acids may be present in the submitted sample  additional confirmatory testing may be necessary for epidemiological  and / or clinical management purposes  to differentiate between  SARS-CoV-2 and other Sarbecovirus currently known to infect humans.  If clinically indicated additional testing with an alternate test  methodology [phone]) is advised. The SARS-CoV-2 RNA is generally  detectable in upper and lower respiratory sp ecimens during the acute  phase of infection. The expected result is Negative. Fact Sheet for Patients:  StrictlyIdeas.no Fact Sheet for Healthcare Providers: BankingDealers.co.za This test is not yet approved or cleared by the [place] FDA and has been authorized for detection and/or diagnosis of SARS-CoV-2 by FDA under an Emergency Use Authorization (EUA).  This EUA will remain in effect (meaning this test can be used) for the duration of the  COVID-19 declaration under Section 564(b)(1) of the Act, 21 U.S.C. section 360bbb-3(b)(1), unless the authorization is terminated or revoked sooner. Performed at [facility], [place] [address], [place], [place] [address]   MRSA PCR Screening     Status: None   Collection Time: 10/28/18  1:22 PM  Result Value Ref Range Status   MRSA by PCR NEGATIVE NEGATIVE Final    Comment:        The GeneXpert MRSA Assay (FDA approved for NASAL specimens only), is one component of a comprehensive MRSA colonization surveillance program. It is not intended to diagnose MRSA infection nor to guide or monitor treatment for MRSA infections. Performed at [facility], [place] [address], [place], [place] [address]          Radiology Studies: Dg Chest [facility] 1 View  Result Date: 10/31/2018 CLINICAL DATA:  Pulmonary edema and asthma EXAM: PORTABLE CHEST 1 VIEW COMPARISON:  Two days ago FINDINGS: Interval tracheal and esophageal extubation. There is a scar-like appearance in the peripheral left upper lobe. Stable cardiomegaly. Stable vascular congestion/edema. Right upper extremity PICC with tip at the SVC. IMPRESSION: 1. Cardiomegaly and persistent vascular congestion/edema. 2. Stable inflation after extubation. Electronically Signed   By: Monte Fantasia M.D.   On: 10/31/2018 07:29   Scheduled Meds: . aspirin  81 mg Oral Daily  . enoxaparin (LOVENOX) injection  80 mg Subcutaneous Q24H  . ferrous gluconate  324 mg Oral Q breakfast  . insulin aspart  3 Units Subcutaneous TID AC  . insulin aspart  3-9 Units Subcutaneous TID AC & HS  . insulin detemir  8 Units Subcutaneous QHS  . ipratropium-albuterol  3 mL Nebulization TID  . isosorbide-hydrALAZINE  0.5 tablet Per Tube TID  . mouth rinse  15 mL Mouth Rinse BID  . pantoprazole  40 mg Oral Daily  . polyethylene glycol  17 g Per Tube Q M,W,F  . potassium chloride  40 mEq  Oral BID  . potassium chloride  40 mEq Oral BID  . Ensure Max Protein  11  oz Oral BID  . rosuvastatin  40 mg Oral q1800  . senna-docusate  1 tablet Oral BID  . spironolactone  12.5 mg Oral Daily   Continuous Infusions: . sodium chloride Stopped (10/31/18 2015)  . furosemide (LASIX) infusion 20 mg/hr (11/01/18 1042)     LOS: 7 days    Time spent: 64min  Damoni Erker C Shritha Bresee, DO [facility] Pager [phone]  If 7PM-7AM, please contact night-coverage [email or web address] Password [email or web address] 11/01/2018, 1:13 PM

## 2018-11-01 NOTE — Progress Notes (Signed)
Patient is finished w/her lunch and requesting to go back to sleep on BiPAP.  RN explains to patient the importance of increased activity and requests patient gets OOB to bedside recliner for a couple of hours.  Patient smiled and said it felt good to be up, although minutes before she did not want to get up.  Patient was transferred to bedside recliner w/moderate assist of 2; no assistive device.  O2 decreased to 3L/[place] d/t sats 100% on 4L/[place].  Will continue to wean (order to wean for O2 sats greater than 90%).

## 2018-11-01 NOTE — Progress Notes (Signed)
Nutrition Follow-up  RD working remotely.  DOCUMENTATION CODES:   Morbid obesity  INTERVENTION:   - Ensure Max po BID, each supplement provides 150 kcal and 30 grams of protein  - Encourage adequate PO intake  NUTRITION DIAGNOSIS:   Inadequate oral intake related to inability to eat as evidenced by NPO status.  Progressing, pt now on Carb Modified diet  GOAL:   Patient will meet greater than or equal to 90% of their needs  Progressing  MONITOR:   PO intake, Supplement acceptance, Labs, I & O's, Weight trends  REASON FOR ASSESSMENT:   Consult Enteral/tube feeding initiation and management  ASSESSMENT:   58 y/o female PMHx morbid obesity, OSA on Bipap, Chronic Resp failure on home O2, DM2, CKD, COPD, CVA, CAD s/p stent, Combined CHF and anemia requiring frequent infusions. Presented to ED on 5/27 initially for SOB in setting of CHF  5/30 - PEA arrest, intubated 6/01 - extubated to BiPAP 6/02 - diet advanced to Carb Modified  Pt currently on BiPAP.  Weight down a total of 20 lbs since first measured weight on 5/27. Pt continues to diurese. Reviewed RN edema assessment. Pt with mild pitting generalized edema and moderate pitting edema to BLE.  RD to order oral nutrition supplement to aid pt in meeting kcal and protein needs.  Meal Completion: 50% x 1 meal yesterday  Medications reviewed and include: [place], SSI, Novolog 3 units TID, Levemir 8 units daily, Protonix, Miralax, K-dur 40 mEq BID, Senna, spironolactone, Lasix in D5 @ 20 ml/hr  Labs reviewed: potassium 3.1 (L), hemoglobin 9.0 (L) CBG's: 106-188 x 24 hours  UOP: 4700 ml x 24 hours I/O's: -18.8 L since admit  Diet Order:   Diet Order            Diet Carb Modified Fluid consistency: Thin; Room service appropriate? Yes  Diet effective now              EDUCATION NEEDS:   Not appropriate for education at this time  Skin:  Skin Assessment: Reviewed RN Assessment  Last BM:  11/01/18 medium type  6  Height:   Ht Readings from Last 1 Encounters:  10/28/18 5\' 11"  (1.803 m)    Weight:   Wt Readings from Last 1 Encounters:  11/01/18 (!) 139.8 kg    Ideal Body Weight:  70.45 kg  BMI:  Body mass index is 42.99 kg/m.  Estimated Nutritional Needs:   Kcal:  2100-2300  Protein:  105-120 grams  Fluid:  >/= 1.8 L    Gaynell Face, MS, RD, LDN Inpatient Clinical Dietitian Pager: [phone] Weekend/After Hours: [phone]

## 2018-11-01 NOTE — Evaluation (Signed)
Occupational Therapy Re Evaluation Patient Details Name: Tamara Silva MRN: [id number] DOB: 09-Aug-1960 Today's Date: 11/01/2018    History of Present Illness Pt is a 58 yo female s/p SOB, edema with a negative CT of chest , 4L O2 dependent. PMHx: recent CVA with seizure requiring intubation, EF of 40-50%, DMT2, chronic kidney dx, CAD, obesity, presents to the ER because of increasing shortness of breath over the last few days with increasing peripheral edema. Pt with seizure on 5/30, PEA arrest while in CT requring extensive CPR and intubated from 5/30-6/1.    Clinical Impression   Pt reevaluated s/p cardiac arrest. Pt presents with decreased activity tolerance. Sp02 dropped to 84% on 2L 02 with ambulation and one activity standing at sink, placed on 3 then 4L to recover to 93% with pursed lip breathing. Pt requires min assist for ambulation with RW and set up to max assist for ADL. Pt with anxiety when she becomes SOB. Recommending post acute rehab in SNF, but pt preferring to go home. States she has 24 hour care of her family at home. Will continue to follow.    Follow Up Recommendations  SNF;Supervision/Assistance - 24 hour(Pt strongly prefers to go home)    Equipment Recommendations  None recommended by OT    Recommendations for Other Services       Precautions / Restrictions Precautions Precautions: Fall Precaution Comments: watch 02 Restrictions Weight Bearing Restrictions: No      Mobility Bed Mobility Overal bed mobility: Needs Assistance Bed Mobility: Sit to Supine       Sit to supine: Mod assist   General bed mobility comments: assist for LEs back into bed  Transfers Overall transfer level: Needs assistance Equipment used: Rolling walker (2 wheeled) Transfers: Sit to/from Stand Sit to Stand: Min guard         General transfer comment: increased time and effort    Balance Overall balance assessment: Needs assistance   Sitting balance-Leahy Scale: Fair        Standing balance-Leahy Scale: Fair Standing balance comment: statically at sink                           ADL either performed or assessed with clinical judgement   ADL Overall ADL's : Needs assistance/impaired Eating/Feeding: Set up;Sitting   Grooming: Wash/dry hands;Standing;Minimal assistance   Upper Body Bathing: Sitting;Moderate assistance   Lower Body Bathing: Maximal assistance;Sitting/lateral leans;Sit to/from stand   Upper Body Dressing : Minimal assistance;Sitting;Standing   Lower Body Dressing: Maximal assistance;Sitting/lateral leans;Sit to/from stand               Functional mobility during ADLs: Minimal assistance;Rolling walker General ADL Comments: Pt with poor activity tolerance, but pushes herself.     Vision Baseline Vision/History: No visual deficits Patient Visual Report: No change from baseline       Perception     Praxis      Pertinent Vitals/Pain Pain Assessment: Faces Faces Pain Scale: Hurts even more Pain Location: chest from compressions Pain Descriptors / Indicators: Sore Pain Intervention(s): Monitored during session;Heat applied     Hand Dominance Right   Extremity/Trunk Assessment Upper Extremity Assessment Upper Extremity Assessment: RUE deficits/detail RUE Deficits / Details: s/p CVA residual weakness   Lower Extremity Assessment Lower Extremity Assessment: Defer to PT evaluation   Cervical / Trunk Assessment Cervical / Trunk Assessment: Other exceptions Cervical / Trunk Exceptions: obesity, flexed posture   Communication Communication Communication: No difficulties  Cognition Arousal/Alertness: Awake/alert Behavior During Therapy: Flat affect;Anxious Overall Cognitive Status: Within Functional Limits for tasks assessed                                     General Comments       Exercises     Shoulder Instructions      Home Living Family/patient expects to be discharged to::  Private residence Living Arrangements: Spouse/significant other;Other relatives(daughter) Available Help at Discharge: Family;Available 24 hours/day Type of Home: House Home Access: Stairs to enter [employer] of Steps: 2 Entrance Stairs-Rails: None Home Layout: One level     Bathroom Shower/Tub: Teacher, early years/pre: Handicapped height   How Accessible: Accessible via walker Home Equipment: Cane - single point;Bedside commode;Walker - 2 wheels          Prior Functioning/Environment Level of Independence: Needs assistance  Gait / Transfers Assistance Needed: Ambulates short distances/household distances with RW and w/c on the porch and in the community ADL's / Homemaking Assistance Needed: modified independent with UB ADL and requiring assist for LB ADL            OT Problem List: Decreased strength;Decreased activity tolerance;Impaired balance (sitting and/or standing);Decreased safety awareness      OT Treatment/Interventions: Self-care/ADL training;Therapeutic exercise;Neuromuscular education;Energy conservation;Therapeutic activities;Patient/family education;Balance training    OT Goals(Current goals can be found in the care plan section) Acute Rehab OT Goals Patient Stated Goal: to go home OT Goal Formulation: With patient Time For Goal Achievement: 11/09/18 Potential to Achieve Goals: Good ADL Goals Pt Will Perform Grooming: with supervision;standing(2 activities) Pt Will Perform Lower Body Dressing: with min assist;with adaptive equipment;sit to/from stand Pt Will Perform Toileting - Clothing Manipulation and hygiene: with min assist;sit to/from stand Additional ADL Goal #2: Pt will perform bed mobility with min assist in preparation for ADL.  OT Frequency: Min 3X/week   Barriers to D/C:            Co-evaluation              AM-PAC OT "6 Clicks" Daily Activity     Outcome Measure Help from another person eating meals?:  None Help from another person taking care of personal grooming?: A Little Help from another person toileting, which includes using toliet, bedpan, or urinal?: A Lot Help from another person bathing (including washing, rinsing, drying)?: A Lot Help from another person to put on and taking off regular upper body clothing?: A Little Help from another person to put on and taking off regular lower body clothing?: A Lot 6 Click Score: 16   End of Session Equipment Utilized During Treatment: Gait belt;Rolling walker;Oxygen Nurse Communication: (wants bipap so she can sleep)  Activity Tolerance: Patient limited by fatigue Patient left: in bed;with call bell/phone within reach;with nursing/sitter in room  OT Visit Diagnosis: Unsteadiness on feet (R26.81);Muscle weakness (generalized) (M62.81)                Time: 5053-9767 OT Time Calculation (min): 42 min Charges:  OT General Charges $OT Visit: 1 Visit OT Evaluation $OT Re-eval: 1 Re-eval  Nestor Lewandowsky, OTR/L Acute Rehabilitation Services Pager: [phone] Office: [phone] Malka So 11/01/2018, 3:36 PM

## 2018-11-02 LAB — GLUCOSE, CAPILLARY
Glucose-Capillary: 138 mg/dL — ABNORMAL HIGH (ref 70–99)
Glucose-Capillary: 222 mg/dL — ABNORMAL HIGH (ref 70–99)
Glucose-Capillary: 160 mg/dL — ABNORMAL HIGH (ref 70–99)
Glucose-Capillary: 113 mg/dL — ABNORMAL HIGH (ref 70–99)

## 2018-11-02 LAB — CBC
HCT: 32.8 % — ABNORMAL LOW (ref 36.0–46.0)
Hemoglobin: 10 g/dL — ABNORMAL LOW (ref 12.0–15.0)
MCH: 26.1 pg (ref 26.0–34.0)
MCHC: 30.5 g/dL (ref 30.0–36.0)
MCV: 85.6 fL (ref 80.0–100.0)
Platelets: 213 10*3/uL (ref 150–400)
RBC: 3.83 MIL/uL — ABNORMAL LOW (ref 3.87–5.11)
RDW: 17.2 % — ABNORMAL HIGH (ref 11.5–15.5)
WBC: 7.2 10*3/uL (ref 4.0–10.5)
nRBC: 0.4 % — ABNORMAL HIGH (ref 0.0–0.2)

## 2018-11-02 LAB — BASIC METABOLIC PANEL
BUN: 69 mg/dL — ABNORMAL HIGH (ref 6–20)
CO2: >50 mmol/L — ABNORMAL HIGH (ref 22–32)
Calcium: 9.9 mg/dL (ref 8.9–10.3)
Chloride: 79 mmol/L — ABNORMAL LOW (ref 98–111)
Creatinine, Ser: 2.23 mg/dL — ABNORMAL HIGH (ref 0.44–1.00)
GFR calc Af Amer: 27 mL/min — ABNORMAL LOW (ref >60)
GFR calc non Af Amer: 24 mL/min — ABNORMAL LOW (ref >60)
Glucose, Bld: 240 mg/dL — ABNORMAL HIGH (ref 70–99)
Potassium: 3.3 mmol/L — ABNORMAL LOW (ref 3.5–5.1)
Sodium: 140 mmol/L (ref 135–145)

## 2018-11-02 MED ORDER — ACETAZOLAMIDE 250 MG PO TABS
250.0000 mg | ORAL_TABLET | Freq: Every day | ORAL | Status: DC
  Administered 2018-11-02 – 2018-11-03 (×2): 250 mg via ORAL
  Filled 2018-11-02 (×2): qty 1

## 2018-11-02 MED ORDER — METOLAZONE 2.5 MG PO TABS
2.5000 mg | ORAL_TABLET | Freq: Once | ORAL | Status: DC
  Filled 2018-11-02: qty 1

## 2018-11-02 MED ORDER — SPIRONOLACTONE 25 MG PO TABS
25.0000 mg | ORAL_TABLET | Freq: Every day | ORAL | Status: DC
  Administered 2018-11-02 – 2018-11-07 (×6): 25 mg via ORAL
  Filled 2018-11-02 (×6): qty 1

## 2018-11-02 MED ORDER — METOLAZONE 2.5 MG PO TABS
2.5000 mg | ORAL_TABLET | Freq: Once | ORAL | Status: AC
  Administered 2018-11-02: 2.5 mg via ORAL
  Filled 2018-11-02: qty 1

## 2018-11-02 MED ORDER — POTASSIUM CHLORIDE CRYS ER 20 MEQ PO TBCR
40.0000 meq | EXTENDED_RELEASE_TABLET | Freq: Once | ORAL | Status: AC
  Administered 2018-11-02: 40 meq via ORAL
  Filled 2018-11-02: qty 2

## 2018-11-02 MED ORDER — POTASSIUM CHLORIDE CRYS ER 20 MEQ PO TBCR
40.0000 meq | EXTENDED_RELEASE_TABLET | Freq: Three times a day (TID) | ORAL | Status: DC
  Administered 2018-11-02 – 2018-11-03 (×6): 40 meq via ORAL
  Filled 2018-11-02 (×6): qty 2

## 2018-11-02 NOTE — Progress Notes (Signed)
Orthopedic Tech Progress Note Patient Details:  Layken Beg 1960-06-29 [id number]  Ortho Devices Type of Ortho Device: Haematologist Ortho Device/Splint Location: Bilateral unna boots, washed legs Ortho Device/Splint Interventions: Application   Post Interventions Patient Tolerated: Well Instructions Provided: Care of device   [name] 11/02/2018, 12:39 PM

## 2018-11-02 NOTE — Progress Notes (Signed)
Pt already on BIPAP at this time. Pt settings 16/6 with 40% and BUR 14. Pt resting comfortable, respiratory status stable at this time. RT will continue to monitor.

## 2018-11-02 NOTE — Progress Notes (Signed)
PROGRESS NOTE    Tamara Silva  QBV:[id number] DOB: 07/23/1960 DOA: 10/25/2018 PCP: Antony Blackbird, MD  Brief Narrative: 58 year old with a history of systolic CHF ejection fraction 40-45%- January 2020, obesity hypoventilation on BiPAP at bedtime, diabetes mellitus type 2, CKD, CAD, recent CVA requiring intubation for seizure presented for increasing shortness of breath and peripheral edema.  Chest x-ray overall unremarkable.  BNP slightly elevated, creatinine elevated from baseline of 1.3 to1.8.  Started on IV diuretics.  Also some component of COPD exacerbation. Cardiology consulted give her adv cardiac issues. Due to signficant amount of overload, PICC was placed to monitor CVP and CoOx, started on Lasix drip.  On 5/30 morning patient suddenly became unresponsive and concerns for seizure which lasted for more than 20 minutes.  She was given 2 mg of IV Ativan and CT of the head was ordered.  While she was getting CT of the head, patient became hypoxic leading to PEA cardiac arrest.  1 round of CPR was performed with regain of pulse but soon after went back again cardiac arrest requiring another round of CPR.  She was transferred to ICU after being intubated.  Neurology and critical care team were consulted. Patient extubated successfully 10/30/18. Transferred back to medicine/cardiology teams for ongoing management and diuresis.  Assessment & Plan:   Principal Problem:   Acute on chronic diastolic CHF (congestive heart failure) (HCC) Active Problems:   Morbid obesity ([place])   CAD S/P percutaneous coronary angioplasty - prior PCI to LAD; RCA PCI: new Xience [place] DES 2.75 mm x 15 mm    COPD exacerbation (HCC)   CKD (chronic kidney disease), stage II   Acute respiratory failure with hypoxia (HCC)   Uncontrolled type 2 diabetes mellitus without complication, with long-term current use of insulin (HCC)   OSA (obstructive sleep apnea)   Tobacco abuse disorder   Hypertension   Iron deficiency anemia    Diabetes mellitus type 2 in obese (HCC)   Slow transit constipation   Cardiac arrest (HCC)   Seizure ([place])   Acute combined diastolic and systolic congestive heart failure with ejection fraction 28% grade 2 diastolic dysfunction, class III -Heart failure team continues to follow, we appreciate insight and recommendations  -CVP remains elevated.  Continue IV Lasix, metolazone, spironolactone per cardiology; acetazolamide to offset hypercarbia -Continues to diurese well -I/O reported 17L net negative since admission -Continue BiPAP at night and as needed -Concurrent hypokalemia being repleted daily as appropriate  Acute on chronic hypoxic hypercarbic respiratory failure previously requiring intubation, successfully extubated 10/30/2018 Cardiac arrest, likely PEA arrest with spontaneous return of circulation Acute metabolic encephalopathy, resolved questionably seizure-like activity versus hypoxia and hypercarbia as above -Patient now extubated, mental status returned back to baseline without overt deficits -Neurology not recommending antiepileptic meds, EEG unremarkable previously -Patient continues to require Bipap HS and PRN; continue to wean oxygen as tolerated - pt reports 4L [place] use at home even at rest and CPAP at night -Patient weaned off oxygen yesterday while at rest but became somewhat anxious despite sats being within normal limits -continue to educate the patient that he may not need around-the-clock oxygen at discharge previously diagnosed -Cardiology/heart failure team following along as above  Acute kidney injury on CKD stage III; suspect Cardiorenal.  -Baseline creatinine around 1.3, -Creatinine now stabilized, previously uptrending, currently stable at around 2.2 -Lasix ongoing per cardiology as above continue to follow daily labs -Possible kidney injury secondary to PEA arrest and hypotension as above  Hypokalemia  -Secondary to  above aggressive diuresis, replete as  appropriate  Diabetes mellitus type 2 -On insulin sliding scale, Levemir 18 units daily.  NovoLog 2 units pre-meals  Obesity hypoventilation syndrome on BiPAP at bedtime -Supportive care.  Lengthy discussion at bedside about need for dietary improvement and exercise regimen  Anemia of chronic disease, Fe deficiency, and B12 deficiency -Continue iron, B12 supplementation  History of CVA complicated by seizure -On aspirin and statin  History of CAD status post stenting -On aspirin statin and Coreg  Essential hypertension -Continue Coreg  Daily tobacco use -Counseled to quit smoking  DVT prophylaxis: Lovenox Code Status: Full Disposition Plan: Potential discharge home pending clinical improvement -patient continues to be moderately volume overloaded with elevated creatinine, hypoxia, ambulatory dysfunction compared to  baseline.  Suspect an additional 24-48 hours of aggressive IV diuresis, close monitoring telemetry, supplemental oxygen and physical therapy.  Consultants:   Cardiology  Subjective: No acute issues or events overnight, tolerating BiPAP well currently, diet improving somewhat over the past 24 hours, denies fevers, chills, shortness of breath, headache, nausea, vomiting, diarrhea.  Does report ongoing musculoskeletal chest pain worse with inspiration.  Objective: Vitals:   11/01/18 2331 11/02/18 0349 11/02/18 0739 11/02/18 1109  BP: 123/64 120/73 124/61 127/68  Pulse: 71 67    Resp: 19 18    Temp: 97.7 F (36.5 C) 98.4 F (36.9 C) 98.1 F (36.7 C) 97.6 F (36.4 C)  TempSrc: Axillary Axillary Axillary Oral  SpO2: 100% 92%    Weight:  (!) 136.9 kg    Height:        Intake/Output Summary (Last 24 hours) at 11/02/2018 1129 Last data filed at 11/02/2018 1105 Gross per 24 hour  Intake 600 ml  Output 7175 ml  Net -6575 ml   Filed Weights   10/31/18 0500 11/01/18 0509 11/02/18 0349  Weight: (!) 148 kg (!) 139.8 kg (!) 136.9 kg    Examination:   General:  Pleasantly resting in bed, No acute distress. HEENT:  Normocephalic atraumatic. Sclerae nonicteric, noninjected. Extraocular movements intact bilaterally. Neck: Without mass or deformity. Trachea is midline. Lungs: Clear to auscultate bilaterally without rhonchi, wheeze, or rales. Heart: Regular rate and rhythm. Without murmurs, rubs, or gallops. Abdomen: Soft, nontender, nondistended. Without guarding or rebound. Extremities: Without cyanosis, clubbing, obvious deformity. Vascular: Dorsalis pedis and posterior tibial pulses palpable bilaterally. Skin: Warm and dry, no erythema, no ulcerations.  Picc line right upper extremity.    Data Reviewed: I have personally reviewed following labs and imaging studies  CBC: Recent Labs  Lab 10/28/18 1023  10/28/18 1418 10/29/18 1144 10/31/18 1405 11/01/18 0320 11/02/18 0920  WBC 9.3  --   --   --  8.9 7.5 7.2  NEUTROABS 7.5  --   --   --   --   --   --   HGB 9.9*   < > 10.5* 10.5* 9.1* 9.0* 10.0*  HCT 33.3*   < > 31.0* 31.0* 29.4* 29.3* 32.8*  MCV 88.6  --   --   --  85.5 85.2 85.6  PLT 209  --   --   --  190 188 213   < > = values in this interval not displayed.   Basic Metabolic Panel: Recent Labs  Lab 10/27/18 0314 10/28/18 0410 10/28/18 1023  10/28/18 1533 10/29/18 0416  10/29/18 1739 10/30/18 0422 10/31/18 0511 10/31/18 1405 11/01/18 0320 11/01/18 1730 11/02/18 0920  NA 141 143 145   < >  --  144   < >  --  143 145  --  144 139 140  K 4.2 3.9 4.0   < >  --  3.9   < >  --  3.2* 3.5  --  3.1* 3.2* 3.3*  CL 105 101 103  --   --  102  --   --  96* 89*  --  86* 80* 79*  CO2 27 31 34*  --   --  32  --   --  35* 39*  --  46* 49* >50*  GLUCOSE 119* 170* 167*  --   --  178*  --   --  221* 151*  --  122* 242* 240*  BUN 60* 58* 59*  --   --  66*  --   --  70* 71*  --  75* 69* 69*  CREATININE 1.79* 1.54* 1.74*  --   --  2.11*  --   --  2.21* 2.26* 2.25* 2.26* 2.28* 2.23*  CALCIUM 9.0 9.2 8.8*  --   --  8.7*  --   --  9.0  9.7  --  9.5 9.8 9.9  MG 2.5* 2.4 2.4  --   --  2.4  --   --  2.1  --   --   --   --   --   PHOS  --   --   --   --  4.6 3.7  --  2.8  --   --   --   --   --   --    < > = values in this interval not displayed.   GFR: Estimated Creatinine Clearance: 42.2 mL/min (A) (by C-G formula based on SCr of 2.23 mg/dL (H)). Liver Function Tests: Recent Labs  Lab 10/26/18 1220 10/28/18 1023 11/01/18 0320  AST 21 25 27   ALT 16 18 16   ALKPHOS 124 112 111  BILITOT 0.7 1.1 1.6*  PROT 9.0* 9.1* 9.0*  ALBUMIN 3.1* 3.0* 2.9*   No results for input(s): LIPASE, AMYLASE in the last 168 hours. Recent Labs  Lab 10/28/18 1533  AMMONIA 63*   Coagulation Profile: No results for input(s): INR, PROTIME in the last 168 hours. Cardiac Enzymes: Recent Labs  Lab 10/28/18 1023 10/28/18 1533 10/28/18 2239  TROPONINI 0.03* 0.10* 0.10*   BNP (last 3 results) No results for input(s): PROBNP in the last 8760 hours. HbA1C: No results for input(s): HGBA1C in the last 72 hours. CBG: Recent Labs  Lab 11/01/18 1143 11/01/18 1621 11/01/18 2108 11/02/18 0629 11/02/18 1105  GLUCAP 125* 132* 128* 138* 222*   Lipid Profile: Recent Labs    10/31/18 0511  TRIG 72   Thyroid Function Tests: No results for input(s): TSH, T4TOTAL, FREET4, T3FREE, THYROIDAB in the last 72 hours. Anemia Panel: No results for input(s): VITAMINB12, FOLATE, FERRITIN, TIBC, IRON, RETICCTPCT in the last 72 hours. Sepsis Labs: Recent Labs  Lab 10/28/18 1023 10/28/18 1533 10/28/18 1727  LATICACIDVEN 1.3 0.9 0.9    Recent Results (from the past 240 hour(s))  SARS Coronavirus 2 (CEPHEID - Performed in [facility] hospital lab), Hosp Order     Status: None   Collection Time: 10/25/18  3:33 AM  Result Value Ref Range Status   SARS Coronavirus 2 NEGATIVE NEGATIVE Final    Comment: (NOTE) If result is NEGATIVE SARS-CoV-2 target nucleic acids are NOT DETECTED. The SARS-CoV-2 RNA is generally detectable in upper and lower   respiratory specimens during the acute phase of infection. The lowest  concentration of SARS-CoV-2  viral copies this assay can detect is 250  copies / mL. A negative result does not preclude SARS-CoV-2 infection  and should not be used as the sole basis for treatment or other  patient management decisions.  A negative result may occur with  improper specimen collection / handling, submission of specimen other  than nasopharyngeal swab, presence of viral mutation(s) within the  areas targeted by this assay, and inadequate number of viral copies  (<250 copies / mL). A negative result must be combined with clinical  observations, patient history, and epidemiological information. If result is POSITIVE SARS-CoV-2 target nucleic acids are DETECTED. The SARS-CoV-2 RNA is generally detectable in upper and lower  respiratory specimens dur ing the acute phase of infection.  Positive  results are indicative of active infection with SARS-CoV-2.  Clinical  correlation with patient history and other diagnostic information is  necessary to determine patient infection status.  Positive results do  not rule out bacterial infection or co-infection with other viruses. If result is PRESUMPTIVE POSTIVE SARS-CoV-2 nucleic acids MAY BE PRESENT.   A presumptive positive result was obtained on the submitted specimen  and confirmed on repeat testing.  While 2019 novel coronavirus  (SARS-CoV-2) nucleic acids may be present in the submitted sample  additional confirmatory testing may be necessary for epidemiological  and / or clinical management purposes  to differentiate between  SARS-CoV-2 and other Sarbecovirus currently known to infect humans.  If clinically indicated additional testing with an alternate test  methodology [id number]) is advised. The SARS-CoV-2 RNA is generally  detectable in upper and lower respiratory sp ecimens during the acute  phase of infection. The expected result is Negative. Fact  Sheet for Patients:  StrictlyIdeas.no Fact Sheet for Healthcare Providers: BankingDealers.co.za This test is not yet approved or cleared by the [place] FDA and has been authorized for detection and/or diagnosis of SARS-CoV-2 by FDA under an Emergency Use Authorization (EUA).  This EUA will remain in effect (meaning this test can be used) for the duration of the COVID-19 declaration under Section 564(b)(1) of the Act, 21 U.S.C. section 360bbb-3(b)(1), unless the authorization is terminated or revoked sooner. Performed at [facility], [place] [address] [place], [place] [address]   MRSA PCR Screening     Status: None   Collection Time: 10/28/18  1:22 PM  Result Value Ref Range Status   MRSA by PCR NEGATIVE NEGATIVE Final    Comment:        The GeneXpert MRSA Assay (FDA approved for NASAL specimens only), is one component of a comprehensive MRSA colonization surveillance program. It is not intended to diagnose MRSA infection nor to guide or monitor treatment for MRSA infections. Performed at [facility], [place] [address] [place],  [address]          Radiology Studies: No results found. Scheduled Meds: . acetaZOLAMIDE  250 mg Oral Daily  . aspirin  81 mg Oral Daily  . enoxaparin (LOVENOX) injection  80 mg Subcutaneous Q24H  . ferrous gluconate  324 mg Oral Q breakfast  . insulin aspart  3 Units Subcutaneous TID AC  . insulin aspart  3-9 Units Subcutaneous TID AC & HS  . insulin detemir  8 Units Subcutaneous QHS  . ipratropium-albuterol  3 mL Nebulization TID  . isosorbide-hydrALAZINE  0.5 tablet Per Tube TID  . mouth rinse  15 mL Mouth Rinse BID  . pantoprazole  40 mg Oral Daily  . polyethylene glycol  17  g Per Tube Q M,W,F  . potassium chloride  40 mEq Oral TID  . Ensure Max Protein  11 oz Oral BID  . rosuvastatin  40 mg Oral q1800  . senna-docusate  1 tablet Oral BID  . spironolactone  25 mg Oral  Daily   Continuous Infusions: . sodium chloride Stopped (10/31/18 2015)  . furosemide (LASIX) infusion 20 mg/hr (11/01/18 1042)     LOS: 8 days    Time spent: 62min  Sirron Francesconi C Isabellamarie Randa, DO [facility] Pager [phone]  If 7PM-7AM, please contact night-coverage [email or web address] Password [email or web address] 11/02/2018, 11:29 AM

## 2018-11-02 NOTE — Progress Notes (Addendum)
Patient ID: Tamara Silva, female   DOB: 07-26-1960, 58 y.o.   MRN: [id number]     Advanced Heart Failure Rounding Note  PCP-Cardiologist: Quay Burow, MD   Subjective:    Had prolonged PEA arrest on 5/30. CPR for 25 minutes. Moved to 4N.  Extubated 6/1.    Breathing gradually improving.  I/Os markedly negative again with Lasix gtt and metolazone.  CVP down to 15.   Creatinine 1.74 -> 2.11 -> 2.21 -> 2.26 -> 2.26 -> 2.28.    Echo: EF 30-35%, mild RV dilation.    Objective:   Weight Range: (!) 136.9 kg Body mass index is 42.09 kg/m.   Vital Signs:   Temp:  [97.7 F (36.5 C)-98.4 F (36.9 C)] 98.1 F (36.7 C) (06/04 0739) Pulse Rate:  [59-71] 67 (06/04 0349) Resp:  [18-20] 18 (06/04 0349) BP: (111-133)/(61-73) 124/61 (06/04 0739) SpO2:  [92 %-100 %] 92 % (06/04 0349) FiO2 (%):  [40 %] 40 % (06/03 1929) Weight:  [136.9 kg] 136.9 kg (06/04 0349) Last BM Date: 10/28/18(per patient)  Weight change: Filed Weights   10/31/18 0500 11/01/18 0509 11/02/18 0349  Weight: (!) 148 kg (!) 139.8 kg (!) 136.9 kg    Intake/Output:   Intake/Output Summary (Last 24 hours) at 11/02/2018 0839 Last data filed at 11/02/2018 0740 Gross per 24 hour  Intake 240 ml  Output 6125 ml  Net -5885 ml      Physical Exam    General: NAD Neck: JVP 12-14, no thyromegaly or thyroid nodule.  Lungs: Crackles at bases.  CV: Nonpalpable PMI.  Heart regular S1/S2, no S3/S4, no murmur.  1+ edema to knees, lower legs wrapped.  Abdomen: Soft, nontender, no hepatosplenomegaly, mild distention.  Skin: Intact without lesions or rashes.  Neurologic: Alert and oriented x 3.  Psych: Normal affect. Extremities: No clubbing or cyanosis.  HEENT: Normal.    Telemetry   NSR 60s, personally reviewed.   Labs    CBC Recent Labs    10/31/18 1405 11/01/18 0320  WBC 8.9 7.5  HGB 9.1* 9.0*  HCT 29.4* 29.3*  MCV 85.5 85.2  PLT 190 893   Basic Metabolic Panel Recent Labs    11/01/18 0320 11/01/18  1730  NA 144 139  K 3.1* 3.2*  CL 86* 80*  CO2 46* 49*  GLUCOSE 122* 242*  BUN 75* 69*  CREATININE 2.26* 2.28*  CALCIUM 9.5 9.8   Liver Function Tests Recent Labs    11/01/18 0320  AST 27  ALT 16  ALKPHOS 111  BILITOT 1.6*  PROT 9.0*  ALBUMIN 2.9*   No results for input(s): LIPASE, AMYLASE in the last 72 hours. Cardiac Enzymes No results for input(s): CKTOTAL, CKMB, CKMBINDEX, TROPONINI in the last 72 hours.  BNP: BNP (last 3 results) Recent Labs    07/08/18 2100 10/25/18 0309 10/28/18 1023  BNP 669.1* 709.4* 1,423.5*    ProBNP (last 3 results) No results for input(s): PROBNP in the last 8760 hours.   D-Dimer No results for input(s): DDIMER in the last 72 hours. Hemoglobin A1C No results for input(s): HGBA1C in the last 72 hours. Fasting Lipid Panel Recent Labs    10/31/18 0511  TRIG 72   Thyroid Function Tests No results for input(s): TSH, T4TOTAL, T3FREE, THYROIDAB in the last 72 hours.  Invalid input(s): FREET3  Other results:   Imaging    No results found.   Medications:     Scheduled Medications: . aspirin  81 mg Oral Daily  .  enoxaparin (LOVENOX) injection  80 mg Subcutaneous Q24H  . ferrous gluconate  324 mg Oral Q breakfast  . insulin aspart  3 Units Subcutaneous TID AC  . insulin aspart  3-9 Units Subcutaneous TID AC & HS  . insulin detemir  8 Units Subcutaneous QHS  . ipratropium-albuterol  3 mL Nebulization TID  . isosorbide-hydrALAZINE  0.5 tablet Per Tube TID  . mouth rinse  15 mL Mouth Rinse BID  . metolazone  2.5 mg Oral Once  . pantoprazole  40 mg Oral Daily  . polyethylene glycol  17 g Per Tube Q M,W,F  . potassium chloride  40 mEq Oral BID  . potassium chloride  40 mEq Oral Once  . potassium chloride  40 mEq Oral TID  . Ensure Max Protein  11 oz Oral BID  . rosuvastatin  40 mg Oral q1800  . senna-docusate  1 tablet Oral BID  . spironolactone  25 mg Oral Daily    Infusions: . sodium chloride Stopped (10/31/18  2015)  . furosemide (LASIX) infusion 20 mg/hr (11/01/18 1042)    PRN Medications: sodium chloride, acetaminophen, albuterol, hydrALAZINE, phenol, sodium chloride flush   Assessment/Plan   1. PEA arrest: Due to severe hypoxia on 5/30.  Now extubated, stable.  Chest pain likely due to CPR.  2. Acute on chronic systolic CHF: Suspect ischemic cardiomyopathy.  Echo in 1/20 with EF 40-45%, RV moderately dilated, PASP 71 mmHg.  Echo this admission with EF 30-35%, mildly dilated RV.  Suspect significant RV failure, may be related to OHS/OSA and COPD. She is massively volume overloaded and also likely has [place]. CVP 15 (improved).  Diuresed very well again yesterday. Creatinine stable at 2.28.  - Continue Lasix 20 mg/hr, still volume overloaded. Will add a dose of metolazone 2.5 x 1 again.  - She will need aggressive K repletion today.   - With HCO3 up to 49, will add acetazolamide 250 mg daily.  - Off carvedilol - Continue Bidil 1/2 tab tid and increase spironolactone to 25 mg daily.  - Can add back low dose Coreg when volume is under better control.  3. AKI: Baseline creatinine around 1.3, 2.28 today but stable with BUN lower.  Suspect cardiorenal syndrome and possibly ATN from code.  Hopefully can see some improvement in creatinine as we lower renal venous pressure.  - Continue to diurese as above 4. Acute hypoxic respiratory failure: Post-arrest, extubated 6/1. Now on nasal cannula.  3. COPD: On home oxygen at baseline.  Has quit smoking.  4. OHS/OSA: On home oxygen at all times and Bipap at night.  5. CAD: History of PCI, most recently had inferior STEMI in 5/15 with DES to RCA.  She has been having chest pain but chest wall is tender post-CPR, doubt ischemia.  - Continue ASA 81 and Crestor 40 daily - With fall in EF, will consider RHC/LHC after diuresis if we can stabilize her creatinine.  6. Remote PE: Continue Lovenox for DVT prophylaxis.   7. Anemia: History of GI bleeding, had endoscopies  earlier this year with no bleeding source found.  Also with history of B12 deficiency. Transferrin saturation low, got IV Fe.   8. Hypokalemia: Supplement aggressively.   Length of Stay: 8  Loralie Champagne, MD  11/02/2018, 8:39 AM  Advanced Heart Failure Team Pager [phone] (M-F; 7a - 4p)  Please contact [facility] for night-coverage after hours (4p -7a ) and weekends on [email or web address]

## 2018-11-02 NOTE — TOC Progression Note (Signed)
Transition of Care [facility]) - Progression Note    Patient Details  Name: Tamara Silva MRN: [id number] Date of Birth: 1961/01/11  Transition of Care [facility]) CM/SW [place], [place] Phone Number: 11/02/2018, 3:24 PM  Clinical Narrative:    CSW spoke with patient regarding PT recommendation of ST Rehab at SNF before returning home. Patient refused SNF and states she wants to go home with home health.   Readmission risk assessment was completed. Patient states her daughter assist her with transportation and her medicines are delivered. Patient reports no barriers.   Patient states she will have to move soon and wanted the CSW to provide housing resources. CSW will provide information requested.   Patient expressed understanding of CSW role and discharge process as well as medical condition. No questions/concerns about plan or treatment at this time.  Thurmond Butts, MSW, [facility] Clinical Social Worker [phone]   Expected Discharge Plan: [place] Barriers to Discharge: Continued Medical Work up  Expected Discharge Plan and Services Expected Discharge Plan: [place]   Discharge Planning Services: CM Consult Post Acute Care Choice: Home Health                             HH Arranged: RN [place]: [facility] Date [place]: 10/26/18 Time [place]: 1114 Representative spoke with at [place]: [place] ([place]) Interventions    Readmission Risk Interventions Readmission Risk Prevention Plan 01/17/2018  Transportation Screening Complete  HRI or Home Care Consult Complete  Some recent data might be hidden

## 2018-11-03 LAB — BASIC METABOLIC PANEL
Anion gap: 16 — ABNORMAL HIGH (ref 5–15)
BUN: 67 mg/dL — ABNORMAL HIGH (ref 6–20)
CO2: 46 mmol/L — ABNORMAL HIGH (ref 22–32)
Calcium: 9.7 mg/dL (ref 8.9–10.3)
Chloride: 74 mmol/L — ABNORMAL LOW (ref 98–111)
Creatinine, Ser: 2.3 mg/dL — ABNORMAL HIGH (ref 0.44–1.00)
GFR calc Af Amer: 26 mL/min — ABNORMAL LOW (ref >60)
GFR calc non Af Amer: 23 mL/min — ABNORMAL LOW (ref >60)
Glucose, Bld: 230 mg/dL — ABNORMAL HIGH (ref 70–99)
Potassium: 3.3 mmol/L — ABNORMAL LOW (ref 3.5–5.1)
Sodium: 136 mmol/L (ref 135–145)

## 2018-11-03 LAB — GLUCOSE, CAPILLARY
Glucose-Capillary: 97 mg/dL (ref 70–99)
Glucose-Capillary: 192 mg/dL — ABNORMAL HIGH (ref 70–99)
Glucose-Capillary: 131 mg/dL — ABNORMAL HIGH (ref 70–99)

## 2018-11-03 LAB — CBC
HCT: 31.6 % — ABNORMAL LOW (ref 36.0–46.0)
Hemoglobin: 9.7 g/dL — ABNORMAL LOW (ref 12.0–15.0)
MCH: 26.1 pg (ref 26.0–34.0)
MCHC: 30.7 g/dL (ref 30.0–36.0)
MCV: 85.2 fL (ref 80.0–100.0)
Platelets: 198 10*3/uL (ref 150–400)
RBC: 3.71 MIL/uL — ABNORMAL LOW (ref 3.87–5.11)
RDW: 17.2 % — ABNORMAL HIGH (ref 11.5–15.5)
WBC: 7.2 10*3/uL (ref 4.0–10.5)
nRBC: 0.3 % — ABNORMAL HIGH (ref 0.0–0.2)

## 2018-11-03 MED ORDER — POTASSIUM CHLORIDE CRYS ER 20 MEQ PO TBCR
40.0000 meq | EXTENDED_RELEASE_TABLET | Freq: Once | ORAL | Status: AC
  Administered 2018-11-03: 40 meq via ORAL
  Filled 2018-11-03: qty 2

## 2018-11-03 MED ORDER — METOLAZONE 2.5 MG PO TABS
2.5000 mg | ORAL_TABLET | Freq: Once | ORAL | Status: AC
  Administered 2018-11-03: 10:00:00 2.5 mg via ORAL
  Filled 2018-11-03: qty 1

## 2018-11-03 NOTE — Progress Notes (Signed)
PROGRESS NOTE    Tamara Silva  WJX:[id number] DOB: 1960-08-11 DOA: 10/25/2018 PCP: Antony Blackbird, MD  Brief Narrative: 58 year old with a history of systolic CHF ejection fraction 40-45%- January 2020, obesity hypoventilation on BiPAP at bedtime, diabetes mellitus type 2, CKD, CAD, recent CVA requiring intubation for seizure presented for increasing shortness of breath and peripheral edema.  Chest x-ray overall unremarkable.  BNP slightly elevated, creatinine elevated from baseline of 1.3 to1.8.  Started on IV diuretics.  Also some component of COPD exacerbation. Cardiology consulted give her adv cardiac issues. Due to signficant amount of overload, PICC was placed to monitor CVP and CoOx, started on Lasix drip.  On 5/30 morning patient suddenly became unresponsive and concerns for seizure which lasted for more than 20 minutes.  She was given 2 mg of IV Ativan and CT of the head was ordered.  While she was getting CT of the head, patient became hypoxic leading to PEA cardiac arrest.  1 round of CPR was performed with regain of pulse but soon after went back again cardiac arrest requiring another round of CPR.  She was transferred to ICU after being intubated.  Neurology and critical care team were consulted. Patient extubated successfully 10/30/18. Transferred back to medicine/cardiology teams for ongoing management and diuresis.  Assessment & Plan:   Principal Problem:   Acute on chronic diastolic CHF (congestive heart failure) (HCC) Active Problems:   Morbid obesity ([place])   CAD S/P percutaneous coronary angioplasty - prior PCI to LAD; RCA PCI: new Xience Alpine DES 2.75 mm x 15 mm    COPD exacerbation (HCC)   CKD (chronic kidney disease), stage II   Acute respiratory failure with hypoxia (HCC)   Uncontrolled type 2 diabetes mellitus without complication, with long-term current use of insulin (HCC)   OSA (obstructive sleep apnea)   Tobacco abuse disorder   Hypertension   Iron deficiency anemia    Diabetes mellitus type 2 in obese (HCC)   Slow transit constipation   Cardiac arrest (HCC)   Seizure ([place])   Acute combined diastolic and systolic congestive heart failure with ejection fraction 21% grade 2 diastolic dysfunction, class III, improving -Heart failure team continues to follow, we appreciate insight and recommendations  -CVP trending appropriately.  Continue IV Lasix, metolazone, spironolactone per cardiology; acetazolamide to offset hypercarbia -Continues to diurese well -I/O reported 18L net negative since admission -Continue BiPAP at night and as needed -Concurrent hypokalemia being repleted daily as appropriate  Acute on chronic hypoxic hypercarbic respiratory failure previously requiring intubation, successfully extubated 10/30/2018 Cardiac arrest, likely PEA arrest with spontaneous return of circulation Acute metabolic encephalopathy, resolved questionably seizure-like activity versus hypoxia and hypercarbia as above -Patient now extubated, mental status returned back to baseline without overt deficits -Patient continues to require Bipap HS and PRN; continue to wean oxygen as tolerated - pt reports 4L [place] use at home even at rest and CPAP at night -Patient weaned off oxygen yesterday while at rest but became somewhat anxious despite sats being within normal limits -continue to educate the patient that he may not need around-the-clock oxygen at discharge previously diagnosed -Neurology not recommending antiepileptic meds, EEG unremarkable previously -Cardiology/heart failure team following along as above  Acute kidney injury on CKD stage III; suspect Cardiorenal - stabilizing -Baseline creatinine around 1.3, plateauing near 2.3 - BUN down trending  -Lasix ongoing per cardiology as above continue to follow daily labs -Possible kidney injury exacerbated by previous PEA arrest and hypotension as above  Hypokalemia, in the  setting of -Secondary to above aggressive diuresis,  replete as appropriate  Diabetes mellitus type 2 -On insulin sliding scale, Levemir 18 units daily.  NovoLog 2 units pre-meals  Obesity hypoventilation syndrome on BiPAP at bedtime -Supportive care.  Lengthy discussion at bedside about need for dietary improvement and exercise regimen  Anemia of chronic disease, Fe deficiency, and B12 deficiency -Continue iron, B12 supplementation  History of CVA complicated by seizure -On aspirin and statin  History of CAD status post stenting -On aspirin statin and Coreg at home - will resume as tolerated  Essential hypertension -Continue home coreg once able to tolerate; currently on lasix, bidil, spironolactone as above;   Daily tobacco use -Counseled to quit smoking  DVT prophylaxis: Lovenox Code Status: Full Disposition Plan: Potential discharge home pending clinical improvement -patient continues to be moderately volume overloaded with elevated creatinine, hypoxia, ambulatory dysfunction compared to  baseline. Suspect an additional 48 hours of aggressive IV diuresis, close monitoring telemetry, supplemental oxygen and physical therapy.  Consultants:   Cardiology  Subjective: No acute issues or events overnight, tolerating BiPAP well currently, diet improving somewhat over the past 24 hours, denies fevers, chills, shortness of breath, headache, nausea, vomiting, diarrhea.  Does report ongoing musculoskeletal chest pain worse with inspiration.  Objective: Vitals:   11/03/18 0343 11/03/18 0350 11/03/18 0822 11/03/18 0935  BP: 134/63     Pulse: 69     Resp:      Temp: 97.8 F (36.6 C)   (!) 97.5 F (36.4 C)  TempSrc: Oral   Oral  SpO2: 100%  100%   Weight:  133.8 kg    Height:        Intake/Output Summary (Last 24 hours) at 11/03/2018 1203 Last data filed at 11/03/2018 1039 Gross per 24 hour  Intake 1159.32 ml  Output 7651 ml  Net -6491.68 ml   Filed Weights   11/01/18 0509 11/02/18 0349 11/03/18 0350  Weight: (!) 139.8  kg (!) 136.9 kg 133.8 kg    Examination:  General:  Pleasantly resting in bed, No acute distress. HEENT:  Normocephalic atraumatic. Sclerae nonicteric, noninjected. Extraocular movements intact bilaterally. Neck: Without mass or deformity. Trachea is midline. Lungs: Clear to auscultate bilaterally without rhonchi, wheeze, or rales. Moderate tenderness to palpation over chest. Heart: Regular rate and rhythm. Without murmurs, rubs, or gallops. Abdomen: Soft, nontender, nondistended. Without guarding or rebound. Extremities: Without cyanosis, clubbing, obvious deformity. Vascular: Dorsalis pedis and posterior tibial pulses palpable bilaterally. Skin: Warm and dry, no erythema, no ulcerations.  Picc line right upper extremity.  Data Reviewed: I have personally reviewed following labs and imaging studies  CBC: Recent Labs  Lab 10/28/18 1023  10/29/18 1144 10/31/18 1405 11/01/18 0320 11/02/18 0920 11/03/18 0355  WBC 9.3  --   --  8.9 7.5 7.2 7.2  NEUTROABS 7.5  --   --   --   --   --   --   HGB 9.9*   < > 10.5* 9.1* 9.0* 10.0* 9.7*  HCT 33.3*   < > 31.0* 29.4* 29.3* 32.8* 31.6*  MCV 88.6  --   --  85.5 85.2 85.6 85.2  PLT 209  --   --  190 188 213 198   < > = values in this interval not displayed.   Basic Metabolic Panel: Recent Labs  Lab 10/28/18 0410 10/28/18 1023  10/28/18 1533 10/29/18 0416  10/29/18 1739 10/30/18 0422 10/31/18 6301 10/31/18 1405 11/01/18 0320 11/01/18 1730 11/02/18 0920 11/03/18 0355  NA  143 145   < >  --  144   < >  --  143 145  --  144 139 140 136  K 3.9 4.0   < >  --  3.9   < >  --  3.2* 3.5  --  3.1* 3.2* 3.3* 3.3*  CL 101 103  --   --  102  --   --  96* 89*  --  86* 80* 79* 74*  CO2 31 34*  --   --  32  --   --  35* 39*  --  46* 49* >50* 46*  GLUCOSE 170* 167*  --   --  178*  --   --  221* 151*  --  122* 242* 240* 230*  BUN 58* 59*  --   --  66*  --   --  70* 71*  --  75* 69* 69* 67*  CREATININE 1.54* 1.74*  --   --  2.11*  --   --  2.21*  2.26* 2.25* 2.26* 2.28* 2.23* 2.30*  CALCIUM 9.2 8.8*  --   --  8.7*  --   --  9.0 9.7  --  9.5 9.8 9.9 9.7  MG 2.4 2.4  --   --  2.4  --   --  2.1  --   --   --   --   --   --   PHOS  --   --   --  4.6 3.7  --  2.8  --   --   --   --   --   --   --    < > = values in this interval not displayed.   GFR: Estimated Creatinine Clearance: 40.4 mL/min (A) (by C-G formula based on SCr of 2.3 mg/dL (H)). Liver Function Tests: Recent Labs  Lab 10/28/18 1023 11/01/18 0320  AST 25 27  ALT 18 16  ALKPHOS 112 111  BILITOT 1.1 1.6*  PROT 9.1* 9.0*  ALBUMIN 3.0* 2.9*   No results for input(s): LIPASE, AMYLASE in the last 168 hours. Recent Labs  Lab 10/28/18 1533  AMMONIA 63*   Coagulation Profile: No results for input(s): INR, PROTIME in the last 168 hours. Cardiac Enzymes: Recent Labs  Lab 10/28/18 1023 10/28/18 1533 10/28/18 2239  TROPONINI 0.03* 0.10* 0.10*   BNP (last 3 results) No results for input(s): PROBNP in the last 8760 hours. HbA1C: No results for input(s): HGBA1C in the last 72 hours. CBG: Recent Labs  Lab 11/02/18 1105 11/02/18 1631 11/02/18 2119 11/03/18 0606 11/03/18 1037  GLUCAP 222* 160* 113* 97 192*   Lipid Profile: No results for input(s): CHOL, HDL, LDLCALC, TRIG, CHOLHDL, LDLDIRECT in the last 72 hours. Thyroid Function Tests: No results for input(s): TSH, T4TOTAL, FREET4, T3FREE, THYROIDAB in the last 72 hours. Anemia Panel: No results for input(s): VITAMINB12, FOLATE, FERRITIN, TIBC, IRON, RETICCTPCT in the last 72 hours. Sepsis Labs: Recent Labs  Lab 10/28/18 1023 10/28/18 1533 10/28/18 1727  LATICACIDVEN 1.3 0.9 0.9    Recent Results (from the past 240 hour(s))  SARS Coronavirus 2 (CEPHEID - Performed in [facility] hospital lab), Hosp Order     Status: None   Collection Time: 10/25/18  3:33 AM  Result Value Ref Range Status   SARS Coronavirus 2 NEGATIVE NEGATIVE Final    Comment: (NOTE) If result is NEGATIVE SARS-CoV-2 target  nucleic acids are NOT DETECTED. The SARS-CoV-2 RNA is generally detectable in upper and lower  respiratory specimens during the acute phase of infection. The lowest  concentration of SARS-CoV-2 viral copies this assay can detect is 250  copies / mL. A negative result does not preclude SARS-CoV-2 infection  and should not be used as the sole basis for treatment or other  patient management decisions.  A negative result may occur with  improper specimen collection / handling, submission of specimen other  than nasopharyngeal swab, presence of viral mutation(s) within the  areas targeted by this assay, and inadequate number of viral copies  (<250 copies / mL). A negative result must be combined with clinical  observations, patient history, and epidemiological information. If result is POSITIVE SARS-CoV-2 target nucleic acids are DETECTED. The SARS-CoV-2 RNA is generally detectable in upper and lower  respiratory specimens dur ing the acute phase of infection.  Positive  results are indicative of active infection with SARS-CoV-2.  Clinical  correlation with patient history and other diagnostic information is  necessary to determine patient infection status.  Positive results do  not rule out bacterial infection or co-infection with other viruses. If result is PRESUMPTIVE POSTIVE SARS-CoV-2 nucleic acids MAY BE PRESENT.   A presumptive positive result was obtained on the submitted specimen  and confirmed on repeat testing.  While 2019 novel coronavirus  (SARS-CoV-2) nucleic acids may be present in the submitted sample  additional confirmatory testing may be necessary for epidemiological  and / or clinical management purposes  to differentiate between  SARS-CoV-2 and other Sarbecovirus currently known to infect humans.  If clinically indicated additional testing with an alternate test  methodology [phone]) is advised. The SARS-CoV-2 RNA is generally  detectable in upper and lower  respiratory sp ecimens during the acute  phase of infection. The expected result is Negative. Fact Sheet for Patients:  StrictlyIdeas.no Fact Sheet for Healthcare Providers: BankingDealers.co.za This test is not yet approved or cleared by the [place] FDA and has been authorized for detection and/or diagnosis of SARS-CoV-2 by FDA under an Emergency Use Authorization (EUA).  This EUA will remain in effect (meaning this test can be used) for the duration of the COVID-19 declaration under Section 564(b)(1) of the Act, 21 U.S.C. section 360bbb-3(b)(1), unless the authorization is terminated or revoked sooner. Performed at [facility], [place] [address], [place], [place] [address]   MRSA PCR Screening     Status: None   Collection Time: 10/28/18  1:22 PM  Result Value Ref Range Status   MRSA by PCR NEGATIVE NEGATIVE Final    Comment:        The GeneXpert MRSA Assay (FDA approved for NASAL specimens only), is one component of a comprehensive MRSA colonization surveillance program. It is not intended to diagnose MRSA infection nor to guide or monitor treatment for MRSA infections. Performed at [facility], [place] [address] [place], [place] [address]          Radiology Studies: No results found. Scheduled Meds: . acetaZOLAMIDE  250 mg Oral Daily  . aspirin  81 mg Oral Daily  . enoxaparin (LOVENOX) injection  80 mg Subcutaneous Q24H  . ferrous gluconate  324 mg Oral Q breakfast  . insulin aspart  3 Units Subcutaneous TID AC  . insulin aspart  3-9 Units Subcutaneous TID AC & HS  . insulin detemir  8 Units Subcutaneous QHS  . ipratropium-albuterol  3 mL Nebulization TID  . isosorbide-hydrALAZINE  0.5 tablet Per Tube TID  . mouth rinse  15 mL Mouth Rinse BID  .  pantoprazole  40 mg Oral Daily  . polyethylene glycol  17 g Per Tube Q M,W,F  . potassium chloride  40 mEq Oral TID  . Ensure Max Protein  11 oz Oral BID   . rosuvastatin  40 mg Oral q1800  . senna-docusate  1 tablet Oral BID  . spironolactone  25 mg Oral Daily   Continuous Infusions: . sodium chloride Stopped (10/31/18 2015)  . furosemide (LASIX) infusion 20 mg/hr (11/03/18 0227)     LOS: 9 days    Time spent: 40min  Terilynn Buresh C Lamarr Feenstra, DO [facility] Pager [phone]  If 7PM-7AM, please contact night-coverage [email or web address] Password [email or web address] 11/03/2018, 12:03 PM

## 2018-11-03 NOTE — Progress Notes (Signed)
Physical Therapy Treatment Patient Details Name: Tamara Silva MRN: [id number] DOB: 1960-09-22 Today's Date: 11/03/2018    History of Present Illness Pt is a 58 yo female s/p SOB, edema with a negative CT of chest , 4L O2 dependent. PMHx: recent CVA with seizure requiring intubation, EF of 40-50%, DMT2, chronic kidney dx, CAD, obesity, presents to the ER because of increasing shortness of breath over the last few days with increasing peripheral edema. Pt with seizure on 5/30, PEA arrest while in CT requring extensive CPR and intubated from 5/30-6/1.     PT Comments    Pt needing 4L Long to maintain >90% sats.  Emphasis today on transitions, sit to stand, gait stability and stamina with RW.    Follow Up Recommendations  SNF;Other (comment)(pt will likely attempt to go straight home./)     Equipment Recommendations  None recommended by PT    Recommendations for Other Services       Precautions / Restrictions Precautions Precautions: Fall    Mobility  Bed Mobility Overal bed mobility: Needs Assistance Bed Mobility: Supine to Sit     Supine to sit: Min assist        Transfers Overall transfer level: Needs assistance Equipment used: Rolling walker (2 wheeled) Transfers: Sit to/from Stand Sit to Stand: Min guard         General transfer comment: cues for hand placement, slow and mildly effortful, cut no assist  Ambulation/Gait Ambulation/Gait assistance: Min guard Gait Distance (Feet): 50 Feet(x2) Assistive device: Rolling walker (2 wheeled) Gait Pattern/deviations: Step-through pattern Gait velocity: slower Gait velocity interpretation: <1.8 ft/sec, indicate of risk for recurrent falls General Gait Details: generally steady, but tired/fatigued.  sats maintained in lower 90's on 4L's until fatigued and then dropped in to the 80's.  Recovered to low 90's with sitting rest.  EHR in the 100's   Stairs             Wheelchair Mobility    Modified Rankin  (Stroke Patients Only)       Balance Overall balance assessment: Needs assistance   Sitting balance-Leahy Scale: Fair     Standing balance support: Bilateral upper extremity supported;No upper extremity supported Standing balance-Leahy Scale: Fair                              Cognition Arousal/Alertness: Awake/alert Behavior During Therapy: WFL for tasks assessed/performed Overall Cognitive Status: Within Functional Limits for tasks assessed                                        Exercises      General Comments        Pertinent Vitals/Pain Faces Pain Scale: Hurts little more Pain Location: chest from compressions Pain Descriptors / Indicators: Sore Pain Intervention(s): Monitored during session    Home Living                      Prior Function            PT Goals (current goals can now be found in the care plan section) Acute Rehab PT Goals Patient Stated Goal: to go home PT Goal Formulation: With patient Time For Goal Achievement: 11/09/18 Potential to Achieve Goals: Good Progress towards PT goals: Progressing toward goals    Frequency    Min 3X/week  PT Plan Current plan remains appropriate    Co-evaluation              AM-PAC PT "6 Clicks" Mobility   Outcome Measure  Help needed turning from your back to your side while in a flat bed without using bedrails?: A Little Help needed moving from lying on your back to sitting on the side of a flat bed without using bedrails?: A Little Help needed moving to and from a bed to a chair (including a wheelchair)?: A Little Help needed standing up from a chair using your arms (e.g., wheelchair or bedside chair)?: A Little Help needed to walk in hospital room?: A Little Help needed climbing 3-5 steps with a railing? : A Lot 6 Click Score: 17    End of Session   Activity Tolerance: Patient tolerated treatment well Patient left: in chair;with call bell/phone  within reach Nurse Communication: Mobility status PT Visit Diagnosis: Other abnormalities of gait and mobility (R26.89);Muscle weakness (generalized) (M62.81);Difficulty in walking, not elsewhere classified (R26.2)     Time: 2992-4268 PT Time Calculation (min) (ACUTE ONLY): 36 min  Charges:  $Gait Training: 8-22 mins $Therapeutic Activity: 8-22 mins                     11/03/2018  Donnella Sham, PT Acute Rehabilitation Services [phone]  (pager) [phone]  (office)   Tessie Fass Bralynn Velador 11/03/2018, 5:29 PM

## 2018-11-03 NOTE — Plan of Care (Signed)
  Problem: Education: Goal: Knowledge of General Education information will improve Description Including pain rating scale, medication(s)/side effects and non-pharmacologic comfort measures Outcome: Progressing   Problem: Health Behavior/Discharge Planning: Goal: Ability to manage health-related needs will improve Outcome: Progressing   Problem: Clinical Measurements: Goal: Ability to maintain clinical measurements within normal limits will improve Outcome: Progressing Goal: Will remain free from infection Outcome: Progressing Goal: Diagnostic test results will improve Outcome: Progressing Goal: Respiratory complications will improve Outcome: Progressing Goal: Cardiovascular complication will be avoided Outcome: Progressing   Problem: Activity: Goal: Risk for activity intolerance will decrease Outcome: Progressing   Problem: Nutrition: Goal: Adequate nutrition will be maintained Outcome: Progressing   Problem: Coping: Goal: Level of anxiety will decrease Outcome: Progressing   Problem: Elimination: Goal: Will not experience complications related to bowel motility Outcome: Progressing Goal: Will not experience complications related to urinary retention Outcome: Progressing   Problem: Pain Managment: Goal: General experience of comfort will improve Outcome: Progressing   Problem: Safety: Goal: Ability to remain free from injury will improve Outcome: Progressing   Problem: Skin Integrity: Goal: Risk for impaired skin integrity will decrease Outcome: Progressing   Problem: Activity: Goal: Ability to tolerate increased activity will improve Outcome: Progressing   Problem: Respiratory: Goal: Ability to maintain a clear airway and adequate ventilation will improve Outcome: Progressing   Problem: Role Relationship: Goal: Method of communication will improve Outcome: Progressing  Discuss importance of patient mobility and ambulation; Pt states understand but  thinks we are moving to fast; Educated patient on how mobility and ambulation will help in healing.

## 2018-11-03 NOTE — Progress Notes (Signed)
Patient ID: Tamara Silva, female   DOB: 1961-03-07, 58 y.o.   MRN: [id number]     Advanced Heart Failure Rounding Note  PCP-Cardiologist: Quay Burow, MD   Subjective:    Had prolonged PEA arrest on 5/30. CPR for 25 minutes. Moved to 4N.  Extubated 6/1.    Breathing gradually improving.  I/Os markedly negative again with Lasix gtt and metolazone.  CVP 15 today.  Weight down another 7 lbs.   She has mild chest pain from CPR, this has improved.   Creatinine 1.74 -> 2.11 -> 2.21 -> 2.26 -> 2.26 -> 2.28 -> 2.3.    Echo: EF 30-35%, mild RV dilation.    Objective:   Weight Range: 133.8 kg Body mass index is 41.14 kg/m.   Vital Signs:   Temp:  [97.6 F (36.4 C)-97.8 F (36.6 C)] 97.8 F (36.6 C) (06/05 0343) Pulse Rate:  [69-76] 69 (06/05 0343) Resp:  [17-22] 21 (06/04 2300) BP: (119-134)/(54-68) 134/63 (06/05 0343) SpO2:  [96 %-100 %] 100 % (06/05 0822) FiO2 (%):  [40 %] 40 % (06/05 0343) Weight:  [133.8 kg] 133.8 kg (06/05 0350) Last BM Date: 11/02/18  Weight change: Filed Weights   11/01/18 0509 11/02/18 0349 11/03/18 0350  Weight: (!) 139.8 kg (!) 136.9 kg 133.8 kg    Intake/Output:   Intake/Output Summary (Last 24 hours) at 11/03/2018 0835 Last data filed at 11/03/2018 0529 Gross per 24 hour  Intake 1519.32 ml  Output 6701 ml  Net -5181.68 ml      Physical Exam    General: NAD Neck: JVP 12-14 cm, no thyromegaly or thyroid nodule.  Lungs: Decreased BS at bases.  CV: Nonpalpable PMI.  Heart regular S1/S2, no S3/S4, no murmur.  1+ edema to knees.   Abdomen: Soft, nontender, no hepatosplenomegaly, no distention.  Skin: Intact without lesions or rashes.  Neurologic: Alert and oriented x 3.  Psych: Normal affect. Extremities: No clubbing or cyanosis.  HEENT: Normal.    Telemetry   NSR 60s, personally reviewed.   Labs    CBC Recent Labs    11/02/18 0920 11/03/18 0355  WBC 7.2 7.2  HGB 10.0* 9.7*  HCT 32.8* 31.6*  MCV 85.6 85.2  PLT 213 338   Basic Metabolic Panel Recent Labs    11/02/18 0920 11/03/18 0355  NA 140 136  K 3.3* 3.3*  CL 79* 74*  CO2 >50* 46*  GLUCOSE 240* 230*  BUN 69* 67*  CREATININE 2.23* 2.30*  CALCIUM 9.9 9.7   Liver Function Tests Recent Labs    11/01/18 0320  AST 27  ALT 16  ALKPHOS 111  BILITOT 1.6*  PROT 9.0*  ALBUMIN 2.9*   No results for input(s): LIPASE, AMYLASE in the last 72 hours. Cardiac Enzymes No results for input(s): CKTOTAL, CKMB, CKMBINDEX, TROPONINI in the last 72 hours.  BNP: BNP (last 3 results) Recent Labs    07/08/18 2100 10/25/18 0309 10/28/18 1023  BNP 669.1* 709.4* 1,423.5*    ProBNP (last 3 results) No results for input(s): PROBNP in the last 8760 hours.   D-Dimer No results for input(s): DDIMER in the last 72 hours. Hemoglobin A1C No results for input(s): HGBA1C in the last 72 hours. Fasting Lipid Panel No results for input(s): CHOL, HDL, LDLCALC, TRIG, CHOLHDL, LDLDIRECT in the last 72 hours. Thyroid Function Tests No results for input(s): TSH, T4TOTAL, T3FREE, THYROIDAB in the last 72 hours.  Invalid input(s): FREET3  Other results:   Imaging    No results  found.   Medications:     Scheduled Medications: . acetaZOLAMIDE  250 mg Oral Daily  . aspirin  81 mg Oral Daily  . enoxaparin (LOVENOX) injection  80 mg Subcutaneous Q24H  . ferrous gluconate  324 mg Oral Q breakfast  . insulin aspart  3 Units Subcutaneous TID AC  . insulin aspart  3-9 Units Subcutaneous TID AC & HS  . insulin detemir  8 Units Subcutaneous QHS  . ipratropium-albuterol  3 mL Nebulization TID  . isosorbide-hydrALAZINE  0.5 tablet Per Tube TID  . mouth rinse  15 mL Mouth Rinse BID  . metolazone  2.5 mg Oral Once  . pantoprazole  40 mg Oral Daily  . polyethylene glycol  17 g Per Tube Q M,W,F  . potassium chloride  40 mEq Oral TID  . potassium chloride  40 mEq Oral Once  . Ensure Max Protein  11 oz Oral BID  . rosuvastatin  40 mg Oral q1800  . senna-docusate   1 tablet Oral BID  . spironolactone  25 mg Oral Daily    Infusions: . sodium chloride Stopped (10/31/18 2015)  . furosemide (LASIX) infusion 20 mg/hr (11/03/18 0227)    PRN Medications: sodium chloride, acetaminophen, albuterol, hydrALAZINE, phenol, sodium chloride flush   Assessment/Plan   1. PEA arrest: Due to severe hypoxia on 5/30.  Now extubated, stable.  Chest pain likely due to CPR.  2. Acute on chronic systolic CHF: Suspect ischemic cardiomyopathy.  Echo in 1/20 with EF 40-45%, RV moderately dilated, PASP 71 mmHg.  Echo this admission with EF 30-35%, mildly dilated RV.  Suspect significant RV failure, may be related to OHS/OSA and COPD. She is massively volume overloaded and also likely has [place]. CVP 15 today.  Diuresed very well again yesterday, weight down another 7 lbs. Creatinine fairly stable at 2.3 with lower BUN.  - Continue Lasix 20 mg/hr, still volume overloaded. Will add a dose of metolazone 2.5 x 1 again.  - She will need aggressive K repletion again today.   - Continue acetazolamide 250 mg daily, HCO3 lower today.  - Continue Bidil 1/2 tab tid and increase spironolactone to 25 mg daily.  - Can add back low dose Coreg when volume is under better control.  3. AKI: Baseline creatinine around 1.3, 2.3 today but stable with BUN lower.  Suspect cardiorenal syndrome and possibly ATN from code.  Hopefully can see some improvement in creatinine as we lower renal venous pressure.  - Continue to diurese as above 4. Acute hypoxic respiratory failure: Post-arrest, extubated 6/1. Now on nasal cannula.  3. COPD: On home oxygen at baseline.  Has quit smoking.  4. OHS/OSA: On home oxygen at all times and Bipap at night.  5. CAD: History of PCI, most recently had inferior STEMI in 5/15 with DES to RCA.  She has been having chest pain but chest wall is tender post-CPR, doubt ischemia.  - Continue ASA 81 and Crestor 40 daily - With fall in EF, will consider RHC/LHC eventually after  diuresis if we can stabilize her creatinine.  6. Remote PE: Continue Lovenox for DVT prophylaxis.   7. Anemia: History of GI bleeding, had endoscopies earlier this year with no bleeding source found.  Also with history of B12 deficiency. Transferrin saturation low, got IV Fe.   8. Hypokalemia: Supplement aggressively.  9. Deconditioning: Work with PT, cardiac rehab.   Length of Stay: 9  Loralie Champagne, MD  11/03/2018, 8:35 AM  Advanced Heart Failure Team Pager  [phone] (M-F; 7a - 4p)  Please contact [facility] for night-coverage after hours (4p -7a ) and weekends on [email or web address]

## 2018-11-03 NOTE — Progress Notes (Signed)
RT placed pt on BIPAP V60 per pt request. Pt stated she was having trouble breathing. Pt settings 16/6, 40% with BUR 14. Pt comfortable at this time. RT will continue to monitor.

## 2018-11-04 LAB — CBC
HCT: 35 % — ABNORMAL LOW (ref 36.0–46.0)
Hemoglobin: 10.7 g/dL — ABNORMAL LOW (ref 12.0–15.0)
MCH: 25.9 pg — ABNORMAL LOW (ref 26.0–34.0)
MCHC: 30.6 g/dL (ref 30.0–36.0)
MCV: 84.7 fL (ref 80.0–100.0)
Platelets: 222 10*3/uL (ref 150–400)
RBC: 4.13 MIL/uL (ref 3.87–5.11)
RDW: 17.1 % — ABNORMAL HIGH (ref 11.5–15.5)
WBC: 9.6 10*3/uL (ref 4.0–10.5)
nRBC: 0 % (ref 0.0–0.2)

## 2018-11-04 LAB — GLUCOSE, CAPILLARY
Glucose-Capillary: 131 mg/dL — ABNORMAL HIGH (ref 70–99)
Glucose-Capillary: 182 mg/dL — ABNORMAL HIGH (ref 70–99)
Glucose-Capillary: 121 mg/dL — ABNORMAL HIGH (ref 70–99)
Glucose-Capillary: 122 mg/dL — ABNORMAL HIGH (ref 70–99)

## 2018-11-04 LAB — BASIC METABOLIC PANEL
Anion gap: 15 (ref 5–15)
BUN: 68 mg/dL — ABNORMAL HIGH (ref 6–20)
CO2: 45 mmol/L — ABNORMAL HIGH (ref 22–32)
Calcium: 10.4 mg/dL — ABNORMAL HIGH (ref 8.9–10.3)
Chloride: 77 mmol/L — ABNORMAL LOW (ref 98–111)
Creatinine, Ser: 2.61 mg/dL — ABNORMAL HIGH (ref 0.44–1.00)
GFR calc Af Amer: 23 mL/min — ABNORMAL LOW (ref >60)
GFR calc non Af Amer: 19 mL/min — ABNORMAL LOW (ref >60)
Glucose, Bld: 120 mg/dL — ABNORMAL HIGH (ref 70–99)
Potassium: 4.1 mmol/L (ref 3.5–5.1)
Sodium: 137 mmol/L (ref 135–145)

## 2018-11-04 MED ORDER — ENOXAPARIN SODIUM 60 MG/0.6ML ~~LOC~~ SOLN
60.0000 mg | SUBCUTANEOUS | Status: DC
  Administered 2018-11-04 – 2018-11-06 (×3): 60 mg via SUBCUTANEOUS
  Filled 2018-11-04 (×3): qty 0.6

## 2018-11-04 MED ORDER — CARVEDILOL 3.125 MG PO TABS
3.1250 mg | ORAL_TABLET | Freq: Two times a day (BID) | ORAL | Status: DC
  Administered 2018-11-04 – 2018-11-07 (×6): 3.125 mg via ORAL
  Filled 2018-11-04 (×7): qty 1

## 2018-11-04 NOTE — Progress Notes (Signed)
Patient ID: Tamara Silva, female   DOB: [birth year], 59 y.o.   MRN: [id number]     Advanced Heart Failure Rounding Note  PCP-Cardiologist: Quay Burow, MD   Subjective:    Had prolonged PEA arrest on 5/30. CPR for 25 minutes. Moved to 4N.  Extubated 6/1.    Breathing gradually improving.  I/Os markedly negative again with Lasix gtt and metolazone.  CVP 8 today.  Weight down 65 lbs total.   She has mild chest pain from CPR, this has improved.   Creatinine 1.74 -> 2.11 -> 2.21 -> 2.26 -> 2.26 -> 2.28 -> 2.3 -> 2.6.    Echo: EF 30-35%, mild RV dilation.    Objective:   Weight Range: 127.9 kg Body mass index is 39.33 kg/m.   Vital Signs:   Temp:  [97.5 F (36.4 C)-98.6 F (37 C)] 98.2 F (36.8 C) (06/06 0842) Pulse Rate:  [72-80] 73 (06/06 0736) Resp:  [19-24] 19 (06/06 0842) BP: (124-131)/(60-72) 126/68 (06/06 0842) SpO2:  [98 %-100 %] 98 % (06/06 0736) FiO2 (%):  [4 %-40 %] 4 % (06/06 0842) Weight:  [127.9 kg] 127.9 kg (06/06 0340) Last BM Date: 11/03/18  Weight change: Filed Weights   11/02/18 0349 11/03/18 0350 11/04/18 0340  Weight: (!) 136.9 kg 133.8 kg 127.9 kg    Intake/Output:   Intake/Output Summary (Last 24 hours) at 11/04/2018 0859 Last data filed at 11/04/2018 0438 Gross per 24 hour  Intake 557.1 ml  Output 5850 ml  Net -5292.9 ml      Physical Exam    General: NAD Neck: Thick, no JVD, no thyromegaly or thyroid nodule.  Lungs: Decreased BS at bases.  CV: Nondisplaced PMI.  Heart regular S1/S2, no S3/S4, no murmur.  No peripheral edema.   Abdomen: Soft, nontender, no hepatosplenomegaly, no distention.  Skin: Intact without lesions or rashes.  Neurologic: Alert and oriented x 3.  Psych: Normal affect. Extremities: No clubbing or cyanosis.  HEENT: Normal.    Telemetry   NSR 60s, personally reviewed.   Labs    CBC Recent Labs    11/03/18 0355 11/04/18 0350  WBC 7.2 9.6  HGB 9.7* 10.7*  HCT 31.6* 35.0*  MCV 85.2 84.7  PLT 198 562    Basic Metabolic Panel Recent Labs    11/03/18 0355 11/04/18 0350  NA 136 137  K 3.3* 4.1  CL 74* 77*  CO2 46* 45*  GLUCOSE 230* 120*  BUN 67* 68*  CREATININE 2.30* 2.61*  CALCIUM 9.7 10.4*   Liver Function Tests No results for input(s): AST, ALT, ALKPHOS, BILITOT, PROT, ALBUMIN in the last 72 hours. No results for input(s): LIPASE, AMYLASE in the last 72 hours. Cardiac Enzymes No results for input(s): CKTOTAL, CKMB, CKMBINDEX, TROPONINI in the last 72 hours.  BNP: BNP (last 3 results) Recent Labs    07/08/18 2100 10/25/18 0309 10/28/18 1023  BNP 669.1* 709.4* 1,423.5*    ProBNP (last 3 results) No results for input(s): PROBNP in the last 8760 hours.   D-Dimer No results for input(s): DDIMER in the last 72 hours. Hemoglobin A1C No results for input(s): HGBA1C in the last 72 hours. Fasting Lipid Panel No results for input(s): CHOL, HDL, LDLCALC, TRIG, CHOLHDL, LDLDIRECT in the last 72 hours. Thyroid Function Tests No results for input(s): TSH, T4TOTAL, T3FREE, THYROIDAB in the last 72 hours.  Invalid input(s): FREET3  Other results:   Imaging    No results found.   Medications:     Scheduled Medications: .  acetaZOLAMIDE  250 mg Oral Daily  . aspirin  81 mg Oral Daily  . enoxaparin (LOVENOX) injection  80 mg Subcutaneous Q24H  . ferrous gluconate  324 mg Oral Q breakfast  . insulin aspart  3 Units Subcutaneous TID AC  . insulin aspart  3-9 Units Subcutaneous TID AC & HS  . insulin detemir  8 Units Subcutaneous QHS  . ipratropium-albuterol  3 mL Nebulization TID  . isosorbide-hydrALAZINE  0.5 tablet Per Tube TID  . mouth rinse  15 mL Mouth Rinse BID  . pantoprazole  40 mg Oral Daily  . polyethylene glycol  17 g Per Tube Q M,W,F  . potassium chloride  40 mEq Oral TID  . Ensure Max Protein  11 oz Oral BID  . rosuvastatin  40 mg Oral q1800  . senna-docusate  1 tablet Oral BID  . spironolactone  25 mg Oral Daily    Infusions: . sodium chloride  Stopped (10/31/18 2015)  . furosemide (LASIX) infusion 20 mg/hr (11/04/18 0538)    PRN Medications: sodium chloride, acetaminophen, albuterol, hydrALAZINE, phenol, sodium chloride flush   Assessment/Plan   1. PEA arrest: Due to severe hypoxia on 5/30.  Now extubated, stable.  Chest pain likely due to CPR.  2. Acute on chronic systolic CHF: Suspect ischemic cardiomyopathy.  Echo in 1/20 with EF 40-45%, RV moderately dilated, PASP 71 mmHg.  Echo this admission with EF 30-35%, mildly dilated RV.  Suspect significant RV failure, may be related to OHS/OSA and COPD. She was massively volume overloaded and also likely has [place]. CVP down to 8 today.  Diuresed very well again yesterday, weight down 65 lbs total. Creatinine higher at 2.6 with stable BUN.  - Stop Lasix today.  Will try to allow some equilibration with higher creatinine.  - Stop acetazolamide.   - Continue Bidil 1/2 tab tid and spironolactone 25 mg daily.  - Restart Coreg 3.125 mg bid.  - Will arrange for RHC on Monday.  3. AKI: Baseline creatinine around 1.3, 2.6 today.  Suspect cardiorenal syndrome and possibly ATN from code. Stopping Lasix today to allow equilibration.   4. Acute hypoxic respiratory failure: Post-arrest, extubated 6/1. Now on nasal cannula.  3. COPD: On home oxygen at baseline.  Has quit smoking.  4. OHS/OSA: On home oxygen at all times and Bipap at night.  5. CAD: History of PCI, most recently had inferior STEMI in 5/15 with DES to RCA.  She has been having chest pain but chest wall is tender post-CPR, doubt ischemia.  - Continue ASA 81 and Crestor 40 daily - With fall in EF, will consider coronary angiography eventually if we can stabilize her creatinine.  6. Remote PE: Continue Lovenox for DVT prophylaxis.   7. Anemia: History of GI bleeding, had endoscopies earlier this year with no bleeding source found.  Also with history of B12 deficiency. Transferrin saturation low, got IV Fe.   8. Hypokalemia: Supplement  aggressively.  9. Deconditioning: Work with PT, cardiac rehab.   Length of Stay: [place], MD  11/04/2018, 8:59 AM  Advanced Heart Failure Team Pager [phone] (M-F; [place])  Please contact [facility] for night-coverage after hours (4p -7a ) and weekends on [email or web address]

## 2018-11-04 NOTE — Progress Notes (Signed)
PROGRESS NOTE    Tamara Silva  PYP:[id number] DOB: [birth year] DOA: 10/25/2018 PCP: Antony Blackbird, MD  Brief Narrative: 58 year old with a history of systolic CHF ejection fraction 40-45%- January 2020, obesity hypoventilation on BiPAP at bedtime, diabetes mellitus type 2, CKD, CAD, recent CVA requiring intubation for seizure presented for increasing shortness of breath and peripheral edema.  Chest x-ray overall unremarkable.  BNP slightly elevated, creatinine elevated from baseline of 1.3 to1.8.  Started on IV diuretics.  Also some component of COPD exacerbation. Cardiology consulted give her adv cardiac issues. Due to signficant amount of overload, PICC was placed to monitor CVP and CoOx, started on Lasix drip.  On 5/30 morning patient suddenly became unresponsive and concerns for seizure which lasted for more than 20 minutes.  She was given 2 mg of IV Ativan and CT of the head was ordered.  While she was getting CT of the head, patient became hypoxic leading to PEA cardiac arrest.  1 round of CPR was performed with regain of pulse but soon after went back again cardiac arrest requiring another round of CPR.  She was transferred to ICU after being intubated.  Neurology and critical care team were consulted. Patient extubated successfully 10/30/18. Transferred back to medicine/cardiology teams for ongoing management and diuresis.  Assessment & Plan:   Principal Problem:   Acute on chronic diastolic CHF (congestive heart failure) (HCC) Active Problems:   Morbid obesity ([place])   CAD S/P percutaneous coronary angioplasty - prior PCI to LAD; RCA PCI: new Xience [place] DES 2.75 mm x 15 mm    COPD exacerbation (HCC)   CKD (chronic kidney disease), stage II   Acute respiratory failure with hypoxia (HCC)   Uncontrolled type 2 diabetes mellitus without complication, with long-term current use of insulin (HCC)   OSA (obstructive sleep apnea)   Tobacco abuse disorder   Hypertension   Iron deficiency anemia    Diabetes mellitus type 2 in obese (HCC)   Slow transit constipation   Cardiac arrest (HCC)   Seizure ([place])   Acute combined diastolic and systolic congestive heart failure with ejection fraction 24% grade 2 diastolic dysfunction, class III, resolving - Heart failure team continues to follow, we appreciate insight and recommendations  - CVP trending downward appropriately.   - Agree with holding Lasix given elevated creatinine as below - Continues to diurese quite well - negative 18L since admission - Acetazolamide stopped, continue BiDil, spironolactone, resume Coreg per cardiology - Cardiology planning RHC on Monday for further evaluation  Acute on chronic hypoxic hypercarbic respiratory failure previously requiring intubation, successfully extubated 10/30/2018 Cardiac arrest, likely PEA arrest with spontaneous return of circulation Acute metabolic encephalopathy, resolved questionably seizure-like activity versus hypoxia and hypercarbia as above - Patient now extubated, mental status returned back to baseline without overt deficits - Patient continues to require Bipap HS and PRN; continue to wean oxygen as tolerated - pt reports 4L [place] use at home even at rest and CPAP at night - Patient previously weaned off oxygen while at rest but became somewhat anxious despite sats being within normal limits -continue to educate the patient that he may not need around-the-clock oxygen at discharge per previous diagnosed given her aggressive diuresis as above - Neurology not recommending antiepileptic meds, EEG unremarkable previously - Cardiology/heart failure team following along as above  Acute kidney injury on CKD stage III; suspect Cardiorenal vs hypoperfusion - Baseline creatinine around 1.3, currently approaching 2.6 - DC Lasix per cardiology which is certainly reasonable - Likely  exacerbated by previous PEA arrest and hypotension  Hypokalemia, in the setting of above - resolving -  Secondary to above aggressive diuresis, replete as appropriate - Likely to improve on spironolactone now as lasix is being weaned back to baseline  Diabetes mellitus type 2 - On insulin sliding scale, Levemir 18 units daily.  NovoLog 2 units pre-meals  Obesity hypoventilation syndrome on BiPAP at bedtime - Continue bipap - tolerating well - Lengthy discussion at bedside about need for dietary improvement and exercise regimen  Anemia of chronic disease, Fe deficiency, and B12 deficiency, stable -Continue iron, B12 supplementation  History of CVA complicated by seizure -On aspirin and statin  History of CAD status post stenting -On aspirin statin and Coreg  Essential hypertension -Continue home coreg once able to tolerate; currently on lasix, bidil, spironolactone as above;   Daily tobacco use -Counseled to quit smoking  DVT prophylaxis: Lovenox Code Status: Full Disposition Plan: Potential discharge home pending clinical improvement -patient continues to be moderately volume overloaded with elevated creatinine, hypoxia, ambulatory dysfunction compared to  baseline. Suspect an additional 48 hours of ongoing diuresis, close monitoring telemetry, supplemental oxygen and physical therapy. Possible RHC planned 11/06/18 per cardiology.  Consultants:   Cardiology  Subjective: No acute issues or events overnight, tolerating BiPAP well currently, diet improving somewhat over the past 24 hours, denies fevers, chills, shortness of breath, headache, nausea, vomiting, diarrhea.  Does report ongoing musculoskeletal chest pain worse with inspiration and exertion but this too is improving with activity.  Objective: Vitals:   11/03/18 2026 11/03/18 2200 11/04/18 0000 11/04/18 0340  BP: 124/60 131/61 124/61 124/72  Pulse: 76  80 79  Resp: (!) 23  20 (!) 22  Temp: 98 F (36.7 C)  98 F (36.7 C) 98.6 F (37 C)  TempSrc: Axillary  Axillary Axillary  SpO2:   99% 100%  Weight:    127.9  kg  Height:        Intake/Output Summary (Last 24 hours) at 11/04/2018 0735 Last data filed at 11/04/2018 0438 Gross per 24 hour  Intake 797.1 ml  Output 7050 ml  Net -6252.9 ml   Filed Weights   11/02/18 0349 11/03/18 0350 11/04/18 0340  Weight: (!) 136.9 kg 133.8 kg 127.9 kg    Examination:  General:  Pleasantly resting in bed, No acute distress. HEENT:  Normocephalic atraumatic. Sclerae nonicteric, noninjected. Extraocular movements intact bilaterally. Neck: Without mass or deformity. Trachea is midline. Lungs: Clear to auscultate bilaterally without rhonchi, wheeze, or rales. Moderate tenderness to palpation over chest. Heart: Regular rate and rhythm. Without murmurs, rubs, or gallops. Abdomen: Soft, nontender, nondistended. Without guarding or rebound. Extremities: Without cyanosis, clubbing, obvious deformity. Vascular: Dorsalis pedis and posterior tibial pulses palpable bilaterally. Skin: Warm and dry, no erythema, no ulcerations.  PICC line right upper extremity.  Data Reviewed: I have personally reviewed following labs and imaging studies  CBC: Recent Labs  Lab 10/28/18 1023  10/31/18 1405 11/01/18 0320 11/02/18 0920 11/03/18 0355 11/04/18 0350  WBC 9.3  --  8.9 7.5 7.2 7.2 9.6  NEUTROABS 7.5  --   --   --   --   --   --   HGB 9.9*   < > 9.1* 9.0* 10.0* 9.7* 10.7*  HCT 33.3*   < > 29.4* 29.3* 32.8* 31.6* 35.0*  MCV 88.6  --  85.5 85.2 85.6 85.2 84.7  PLT 209  --  190 188 213 198 222   < > = values in this  interval not displayed.   Basic Metabolic Panel: Recent Labs  Lab 10/28/18 1023  10/28/18 1533 10/29/18 0416  10/29/18 1739 10/30/18 0422  11/01/18 0320 11/01/18 1730 11/02/18 0920 11/03/18 0355 11/04/18 0350  NA 145   < >  --  144   < >  --  143   < > 144 139 140 136 137  K 4.0   < >  --  3.9   < >  --  3.2*   < > 3.1* 3.2* 3.3* 3.3* 4.1  CL 103  --   --  102  --   --  96*   < > 86* 80* 79* 74* 77*  CO2 34*  --   --  32  --   --  35*   < > 46* 49*  >50* 46* 45*  GLUCOSE 167*  --   --  178*  --   --  221*   < > 122* 242* 240* 230* 120*  BUN 59*  --   --  66*  --   --  70*   < > 75* 69* 69* 67* 68*  CREATININE 1.74*  --   --  2.11*  --   --  2.21*   < > 2.26* 2.28* 2.23* 2.30* 2.61*  CALCIUM 8.8*  --   --  8.7*  --   --  9.0   < > 9.5 9.8 9.9 9.7 10.4*  MG 2.4  --   --  2.4  --   --  2.1  --   --   --   --   --   --   PHOS  --   --  4.6 3.7  --  2.8  --   --   --   --   --   --   --    < > = values in this interval not displayed.   GFR: Estimated Creatinine Clearance: 34.7 mL/min (A) (by C-G formula based on SCr of 2.61 mg/dL (H)). Liver Function Tests: Recent Labs  Lab 10/28/18 1023 11/01/18 0320  AST 25 27  ALT 18 16  ALKPHOS 112 111  BILITOT 1.1 1.6*  PROT 9.1* 9.0*  ALBUMIN 3.0* 2.9*   No results for input(s): LIPASE, AMYLASE in the last 168 hours. Recent Labs  Lab 10/28/18 1533  AMMONIA 63*   Coagulation Profile: No results for input(s): INR, PROTIME in the last 168 hours. Cardiac Enzymes: Recent Labs  Lab 10/28/18 1023 10/28/18 1533 10/28/18 2239  TROPONINI 0.03* 0.10* 0.10*   BNP (last 3 results) No results for input(s): PROBNP in the last 8760 hours. HbA1C: No results for input(s): HGBA1C in the last 72 hours. CBG: Recent Labs  Lab 11/02/18 2119 11/03/18 0606 11/03/18 1037 11/03/18 2102 11/04/18 0624  GLUCAP 113* 97 192* 131* 131*   Lipid Profile: No results for input(s): CHOL, HDL, LDLCALC, TRIG, CHOLHDL, LDLDIRECT in the last 72 hours. Thyroid Function Tests: No results for input(s): TSH, T4TOTAL, FREET4, T3FREE, THYROIDAB in the last 72 hours. Anemia Panel: No results for input(s): VITAMINB12, FOLATE, FERRITIN, TIBC, IRON, RETICCTPCT in the last 72 hours. Sepsis Labs: Recent Labs  Lab 10/28/18 1023 10/28/18 1533 10/28/18 1727  LATICACIDVEN 1.3 0.9 0.9    Recent Results (from the past 240 hour(s))  MRSA PCR Screening     Status: None   Collection Time: 10/28/18  1:22 PM  Result  Value Ref Range Status   MRSA by PCR NEGATIVE NEGATIVE Final  Comment:        The GeneXpert MRSA Assay (FDA approved for NASAL specimens only), is one component of a comprehensive MRSA colonization surveillance program. It is not intended to diagnose MRSA infection nor to guide or monitor treatment for MRSA infections. Performed at [facility], [place] [address], [place], [place] [address]          Radiology Studies: No results found. Scheduled Meds: . acetaZOLAMIDE  250 mg Oral Daily  . aspirin  81 mg Oral Daily  . enoxaparin (LOVENOX) injection  80 mg Subcutaneous Q24H  . ferrous gluconate  324 mg Oral Q breakfast  . insulin aspart  3 Units Subcutaneous TID AC  . insulin aspart  3-9 Units Subcutaneous TID AC & HS  . insulin detemir  8 Units Subcutaneous QHS  . ipratropium-albuterol  3 mL Nebulization TID  . isosorbide-hydrALAZINE  0.5 tablet Per Tube TID  . mouth rinse  15 mL Mouth Rinse BID  . pantoprazole  40 mg Oral Daily  . polyethylene glycol  17 g Per Tube Q M,W,F  . potassium chloride  40 mEq Oral TID  . Ensure Max Protein  11 oz Oral BID  . rosuvastatin  40 mg Oral q1800  . senna-docusate  1 tablet Oral BID  . spironolactone  25 mg Oral Daily   Continuous Infusions: . sodium chloride Stopped (10/31/18 2015)  . furosemide (LASIX) infusion 20 mg/hr (11/04/18 0538)     LOS: 10 days    Time spent: 18min  Dainelle Hun C Emiliano Welshans, DO [facility] Pager [phone]  If 7PM-7AM, please contact night-coverage [email or web address] Password [facility] 11/04/2018, 7:35 AM

## 2018-11-04 NOTE — Progress Notes (Signed)
In to assist patient with ambulation from cardiac rehab, she states she is very tired and refused ambulation.  Strongly encouraged and discussed importance of ambulation and activity.  Stressed to walk 3 times daily with nursing staff. " I just want a warm blanket and to sleep".  Patient given warm blanket. [phone]

## 2018-11-05 LAB — GLUCOSE, CAPILLARY
Glucose-Capillary: 106 mg/dL — ABNORMAL HIGH (ref 70–99)
Glucose-Capillary: 175 mg/dL — ABNORMAL HIGH (ref 70–99)
Glucose-Capillary: 161 mg/dL — ABNORMAL HIGH (ref 70–99)
Glucose-Capillary: 127 mg/dL — ABNORMAL HIGH (ref 70–99)

## 2018-11-05 LAB — BASIC METABOLIC PANEL
Anion gap: 16 — ABNORMAL HIGH (ref 5–15)
BUN: 77 mg/dL — ABNORMAL HIGH (ref 6–20)
CO2: 41 mmol/L — ABNORMAL HIGH (ref 22–32)
Calcium: 10 mg/dL (ref 8.9–10.3)
Chloride: 81 mmol/L — ABNORMAL LOW (ref 98–111)
Creatinine, Ser: 2.67 mg/dL — ABNORMAL HIGH (ref 0.44–1.00)
GFR calc Af Amer: 22 mL/min — ABNORMAL LOW (ref >60)
GFR calc non Af Amer: 19 mL/min — ABNORMAL LOW (ref >60)
Glucose, Bld: 101 mg/dL — ABNORMAL HIGH (ref 70–99)
Potassium: 3.3 mmol/L — ABNORMAL LOW (ref 3.5–5.1)
Sodium: 138 mmol/L (ref 135–145)

## 2018-11-05 LAB — CBC
HCT: 39.1 % (ref 36.0–46.0)
Hemoglobin: 11.7 g/dL — ABNORMAL LOW (ref 12.0–15.0)
MCH: 25.9 pg — ABNORMAL LOW (ref 26.0–34.0)
MCHC: 29.9 g/dL — ABNORMAL LOW (ref 30.0–36.0)
MCV: 86.5 fL (ref 80.0–100.0)
Platelets: 234 10*3/uL (ref 150–400)
RBC: 4.52 MIL/uL (ref 3.87–5.11)
RDW: 17.3 % — ABNORMAL HIGH (ref 11.5–15.5)
WBC: 10 10*3/uL (ref 4.0–10.5)
nRBC: 0 % (ref 0.0–0.2)

## 2018-11-05 MED ORDER — POTASSIUM CHLORIDE CRYS ER 20 MEQ PO TBCR
40.0000 meq | EXTENDED_RELEASE_TABLET | Freq: Once | ORAL | Status: AC
  Administered 2018-11-05: 40 meq via ORAL
  Filled 2018-11-05: qty 2

## 2018-11-05 MED ORDER — SODIUM CHLORIDE 0.9% FLUSH: 3.0000 mL | INTRAVENOUS | Status: DC | PRN

## 2018-11-05 MED ORDER — SODIUM CHLORIDE 0.9 % IV SOLN: 250.0000 mL | INTRAVENOUS | Status: DC | PRN

## 2018-11-05 MED ORDER — ASPIRIN 81 MG PO CHEW
81.0000 mg | CHEWABLE_TABLET | ORAL | Status: AC
  Administered 2018-11-06: 81 mg via ORAL
  Filled 2018-11-05: qty 1

## 2018-11-05 MED ORDER — POTASSIUM CHLORIDE CRYS ER 20 MEQ PO TBCR
20.0000 meq | EXTENDED_RELEASE_TABLET | Freq: Every day | ORAL | Status: DC
  Administered 2018-11-06: 20 meq via ORAL
  Filled 2018-11-05 (×2): qty 1

## 2018-11-05 MED ORDER — SODIUM CHLORIDE 0.9% FLUSH
3.0000 mL | Freq: Two times a day (BID) | INTRAVENOUS | Status: DC
  Administered 2018-11-05 – 2018-11-07 (×5): 3 mL via INTRAVENOUS

## 2018-11-05 MED ORDER — TORSEMIDE 20 MG PO TABS
40.0000 mg | ORAL_TABLET | Freq: Every day | ORAL | Status: DC
  Administered 2018-11-05 – 2018-11-06 (×2): 40 mg via ORAL
  Filled 2018-11-05 (×3): qty 2

## 2018-11-05 MED ORDER — DM-GUAIFENESIN ER 30-600 MG PO TB12
1.0000 | ORAL_TABLET | Freq: Two times a day (BID) | ORAL | Status: DC
  Administered 2018-11-05 – 2018-11-07 (×5): 1 via ORAL
  Filled 2018-11-05 (×5): qty 1

## 2018-11-05 MED ORDER — SODIUM CHLORIDE 0.9 % IV SOLN
INTRAVENOUS | Status: DC
  Administered 2018-11-06: 06:00:00 via INTRAVENOUS

## 2018-11-05 NOTE — Progress Notes (Signed)
PROGRESS NOTE    Tamara Silva  HUD:[id number] DOB: 11/23/1960 DOA: 10/25/2018 PCP: Antony Blackbird, MD  Brief Narrative: 58 year old with a history of systolic CHF ejection fraction 40-45%- January 2020, obesity hypoventilation on BiPAP at bedtime, diabetes mellitus type 2, CKD, CAD, recent CVA requiring intubation for seizure presented for increasing shortness of breath and peripheral edema.  Chest x-ray overall unremarkable.  BNP slightly elevated, creatinine elevated from baseline of 1.3 to1.8.  Started on IV diuretics.  Also some component of COPD exacerbation. Cardiology consulted give her adv cardiac issues. Due to signficant amount of overload, PICC was placed to monitor CVP and CoOx, started on Lasix drip.  On 5/30 morning patient suddenly became unresponsive and concerns for seizure which lasted for more than 20 minutes.  She was given 2 mg of IV Ativan and CT of the head was ordered.  While she was getting CT of the head, patient became hypoxic leading to PEA cardiac arrest.  1 round of CPR was performed with regain of pulse but soon after went back again cardiac arrest requiring another round of CPR.  She was transferred to ICU after being intubated.  Neurology and critical care team were consulted. Patient extubated successfully 10/30/18. Transferred back to medicine/cardiology teams for ongoing management and diuresis.  Assessment & Plan:   Principal Problem:   Acute on chronic diastolic CHF (congestive heart failure) (HCC) Active Problems:   Morbid obesity ([place])   CAD S/P percutaneous coronary angioplasty - prior PCI to LAD; RCA PCI: new Xience [place] DES 2.75 mm x 15 mm    COPD exacerbation (HCC)   CKD (chronic kidney disease), stage II   Acute respiratory failure with hypoxia (HCC)   Uncontrolled type 2 diabetes mellitus without complication, with long-term current use of insulin (HCC)   OSA (obstructive sleep apnea)   Tobacco abuse disorder   Hypertension   Iron deficiency anemia    Diabetes mellitus type 2 in obese (HCC)   Slow transit constipation   Cardiac arrest (HCC)   Seizure ([place])   Acute combined diastolic and systolic congestive heart failure with ejection fraction 85% grade 2 diastolic dysfunction, class III, resolving - Heart failure team continues to follow, we appreciate insight and recommendations  - CVP trending downward appropriately.   - Agree with holding Lasix given elevated creatinine as below - Continues to diurese quite well - negative nearly 20L since admission - Cardiology restarted torsemide 40 mg daily; uptitrate pending creatinine improvement - Continue Bidil, carvedilol and spironolactone  - Cardiology planning RHC on Monday 11/06/18 for further evaluation  Acute on chronic hypoxic hypercarbic respiratory failure previously requiring intubation, successfully extubated 10/30/2018 Cardiac arrest, likely PEA arrest with spontaneous return of circulation Acute metabolic encephalopathy, resolved questionably seizure-like activity versus hypoxia and hypercarbia as above - Patient now extubated, mental status returned back to baseline without overt deficits - Patient continues to require Bipap HS and PRN; continue to wean oxygen as tolerated - pt reports 4L [place] use at home even at rest and CPAP at night - Patient previously weaned off oxygen while at rest but became somewhat anxious despite sats being within normal limits -continue to educate the patient that he may not need around-the-clock oxygen at discharge per previous diagnosed given her aggressive diuresis as above - Neurology not currently recommending antiepileptic meds, EEG unremarkable previously - Cardiology/heart failure team following along as above  Acute kidney injury on CKD stage III; suspect Cardiorenal vs hypoperfusion - Baseline creatinine around 1.3, currently approaching 2.7 -  follow closely - Off IV lasix - PO torsemide resumed by cardiology - Likely exacerbated by previous  PEA arrest and hypotension  Hypokalemia, in the setting of above - stable - Secondary to above aggressive diuresis, replete as appropriate - Start 24meq daily and adjust as necessary - Likely to improve on spironolactone now as lasix is being weaned back to baseline  Diabetes mellitus type 2 - On insulin sliding scale, Levemir 8 units daily - Continue 3u premeal + sliding scale insulin/achs glucose checks and hypoglycemic protocol  Obesity hypoventilation syndrome on BiPAP at bedtime - Continue bipap - tolerating well - Lengthy discussion at bedside about need for dietary improvement and exercise regimen  Anemia of chronic disease, Fe deficiency, and B12 deficiency, stable - Continue iron, B12 supplementation  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 Suspect an additional 24-48 hours of ongoing diuresis, close monitoring on telemetry, supplemental oxygen and physical therapy. Possible RHC planned 11/06/18 per cardiology. Disposition pending findings on cath. PT recommending SNF at this time but pt continues to insist on DC home.  Consultants:   Cardiology  Subjective: No acute issues or events overnight, tolerating BiPAP well currently, diet improving somewhat over the past 24 hours, denies fevers, chills, shortness of breath, headache, nausea, vomiting, diarrhea.  Does report ongoing musculoskeletal chest pain worse with inspiration and exertion but this too is  improving with activity.  Objective: Vitals:   11/05/18 0325 11/05/18 0407 11/05/18 0443 11/05/18 0747  BP:  112/66  110/68  Pulse: 72 74  79  Resp: (!) 21 17 20 18   Temp:  97.7 F (36.5 C)  97.6 F (36.4 C)  TempSrc:  Axillary  Oral  SpO2: 98% 97% 98% 98%  Weight:  123.6 kg    Height:        Intake/Output Summary (Last 24 hours) at 11/05/2018 0755 Last data filed at 11/04/2018 2349 Gross per 24 hour  Intake 735.7 ml  Output 2400 ml  Net -1664.3 ml   Filed Weights   11/03/18 0350 11/04/18 0340 11/05/18 0407  Weight: 133.8 kg 127.9 kg 123.6 kg    Examination:  General:  Pleasantly resting in bed, No acute distress. HEENT:  Normocephalic atraumatic. Sclerae nonicteric, noninjected. Extraocular movements intact bilaterally. Neck: Without mass or deformity. Trachea is midline. Lungs: Clear to auscultate bilaterally without rhonchi, wheeze, or rales. Moderate tenderness to palpation over chest. Heart: Regular rate and rhythm. Without murmurs, rubs, or gallops. Abdomen: Soft, nontender, nondistended. Without guarding or rebound. Extremities: Without cyanosis, clubbing, obvious deformity. Vascular: Dorsalis pedis and posterior tibial pulses palpable bilaterally. Skin: Warm and dry, no erythema, no ulcerations.  PICC line right upper extremity.  Data Reviewed: I have personally reviewed following labs and imaging studies  CBC: Recent Labs  Lab 11/01/18 0320 11/02/18 0920 11/03/18 0355 11/04/18 0350 11/05/18 0438  WBC 7.5 7.2 7.2 9.6 10.0  HGB 9.0* 10.0* 9.7* 10.7* 11.7*  HCT 29.3* 32.8* 31.6* 35.0* 39.1  MCV 85.2 85.6 85.2 84.7 86.5  PLT 188 213 198 222 234   Basic  Metabolic Panel: Recent Labs  Lab 10/29/18 1739 10/30/18 0422  11/01/18 1730 11/02/18 0920 11/03/18 0355 11/04/18 0350 11/05/18 0438  NA  --  143   < > 139 140 136 137 138  K  --  3.2*   < > 3.2* 3.3* 3.3* 4.1 3.3*  CL  --  96*   < > 80* 79* 74* 77* 81*  CO2  --  35*   < > 49* >50* 46* 45* 41*   GLUCOSE  --  221*   < > 242* 240* 230* 120* 101*  BUN  --  70*   < > 69* 69* 67* 68* 77*  CREATININE  --  2.21*   < > 2.28* 2.23* 2.30* 2.61* 2.67*  CALCIUM  --  9.0   < > 9.8 9.9 9.7 10.4* 10.0  MG  --  2.1  --   --   --   --   --   --   PHOS 2.8  --   --   --   --   --   --   --    < > = values in this interval not displayed.   GFR: Estimated Creatinine Clearance: 33.3 mL/min (A) (by C-G formula based on SCr of 2.67 mg/dL (H)). Liver Function Tests: Recent Labs  Lab 11/01/18 0320  AST 27  ALT 16  ALKPHOS 111  BILITOT 1.6*  PROT 9.0*  ALBUMIN 2.9*   No results for input(s): LIPASE, AMYLASE in the last 168 hours. No results for input(s): AMMONIA in the last 168 hours. Coagulation Profile: No results for input(s): INR, PROTIME in the last 168 hours. Cardiac Enzymes: No results for input(s): CKTOTAL, CKMB, CKMBINDEX, TROPONINI in the last 168 hours. BNP (last 3 results) No results for input(s): PROBNP in the last 8760 hours. HbA1C: No results for input(s): HGBA1C in the last 72 hours. CBG: Recent Labs  Lab 11/04/18 0624 11/04/18 1137 11/04/18 1630 11/04/18 2118 11/05/18 0559  GLUCAP 131* 182* 121* 122* 106*   Lipid Profile: No results for input(s): CHOL, HDL, LDLCALC, TRIG, CHOLHDL, LDLDIRECT in the last 72 hours. Thyroid Function Tests: No results for input(s): TSH, T4TOTAL, FREET4, T3FREE, THYROIDAB in the last 72 hours. Anemia Panel: No results for input(s): VITAMINB12, FOLATE, FERRITIN, TIBC, IRON, RETICCTPCT in the last 72 hours. Sepsis Labs: No results for input(s): PROCALCITON, LATICACIDVEN in the last 168 hours.  Recent Results (from the past 240 hour(s))  MRSA PCR Screening     Status: None   Collection Time: 10/28/18  1:22 PM  Result Value Ref Range Status   MRSA by PCR NEGATIVE NEGATIVE Final    Comment:        The GeneXpert MRSA Assay (FDA approved for NASAL specimens only), is one component of a comprehensive MRSA colonization surveillance  program. It is not intended to diagnose MRSA infection nor to guide or monitor treatment for MRSA infections. Performed at [facility], [place] [address], [place], [place] [address]          Radiology Studies: No results found. Scheduled Meds: . aspirin  81 mg Oral Daily  . carvedilol  3.125 mg Oral BID WC  . enoxaparin (LOVENOX) injection  60 mg Subcutaneous Q24H  . ferrous gluconate  324 mg Oral Q breakfast  . insulin aspart  3 Units Subcutaneous TID AC  . insulin aspart  3-9 Units Subcutaneous TID AC & HS  . insulin detemir  8 Units Subcutaneous QHS  . ipratropium-albuterol  3 mL Nebulization TID  . isosorbide-hydrALAZINE  0.5 tablet Per Tube TID  . mouth rinse  15 mL Mouth Rinse BID  . pantoprazole  40 mg Oral Daily  . polyethylene glycol  17 g Per Tube Q M,W,F  . Ensure Max Protein  11 oz Oral BID  . rosuvastatin  40 mg Oral q1800  . senna-docusate  1 tablet Oral BID  . spironolactone  25 mg Oral Daily   Continuous Infusions: . sodium chloride Stopped (10/31/18 2015)     LOS: 11 days    Time spent: 33min  Ryeleigh Santore C Mumin Denomme, DO [facility] Pager [phone]  If 7PM-7AM, please contact night-coverage [email or web address] Password [name] 11/05/2018, 7:55 AM

## 2018-11-05 NOTE — Progress Notes (Signed)
Patient ID: Tamara Silva, female   DOB: December 20, 1960, 58 y.o.   MRN: [id number]     Advanced Heart Failure Rounding Note  PCP-Cardiologist: Quay Burow, MD   Subjective:    Had prolonged PEA arrest on 5/30. CPR for 25 minutes. Moved to 4N.  Extubated 6/1.    Breathing gradually improving. IV Lasix stopped yesterday.   She has mild chest pain from CPR, this has improved.   Creatinine 1.74 -> 2.11 -> 2.21 -> 2.26 -> 2.26 -> 2.28 -> 2.3 -> 2.6 -> 2.67.    Echo: EF 30-35%, mild RV dilation.    Objective:   Weight Range: 123.6 kg Body mass index is 38 kg/m.   Vital Signs:   Temp:  [97.6 F (36.4 C)-98.9 F (37.2 C)] 97.6 F (36.4 C) (06/07 0747) Pulse Rate:  [71-84] 79 (06/07 0747) Resp:  [17-24] 18 (06/07 0747) BP: (110-126)/(58-69) 110/68 (06/07 0747) SpO2:  [95 %-100 %] 95 % (06/07 0847) FiO2 (%):  [40 %] 40 % (06/07 0325) Weight:  [123.6 kg] 123.6 kg (06/07 0407) Last BM Date: 11/03/18  Weight change: Filed Weights   11/03/18 0350 11/04/18 0340 11/05/18 0407  Weight: 133.8 kg 127.9 kg 123.6 kg    Intake/Output:   Intake/Output Summary (Last 24 hours) at 11/05/2018 0934 Last data filed at 11/04/2018 2349 Gross per 24 hour  Intake 617.7 ml  Output 1100 ml  Net -482.3 ml      Physical Exam    General: NAD, obese.  Neck: JVP 9-10 cm, no thyromegaly or thyroid nodule.  Lungs: Decreased BS at bases.  CV: Nondisplaced PMI.  Heart regular S1/S2, no S3/S4, no murmur.  No peripheral edema.  Abdomen: Soft, nontender, no hepatosplenomegaly, no distention.  Skin: Intact without lesions or rashes.  Neurologic: Alert and oriented x 3.  Psych: Normal affect. Extremities: No clubbing or cyanosis.  HEENT: Normal.    Telemetry   NSR 60s, personally reviewed.   Labs    CBC Recent Labs    11/04/18 0350 11/05/18 0438  WBC 9.6 10.0  HGB 10.7* 11.7*  HCT 35.0* 39.1  MCV 84.7 86.5  PLT 222 578   Basic Metabolic Panel Recent Labs    11/04/18 0350 11/05/18  0438  NA 137 138  K 4.1 3.3*  CL 77* 81*  CO2 45* 41*  GLUCOSE 120* 101*  BUN 68* 77*  CREATININE 2.61* 2.67*  CALCIUM 10.4* 10.0   Liver Function Tests No results for input(s): AST, ALT, ALKPHOS, BILITOT, PROT, ALBUMIN in the last 72 hours. No results for input(s): LIPASE, AMYLASE in the last 72 hours. Cardiac Enzymes No results for input(s): CKTOTAL, CKMB, CKMBINDEX, TROPONINI in the last 72 hours.  BNP: BNP (last 3 results) Recent Labs    07/08/18 2100 10/25/18 0309 10/28/18 1023  BNP 669.1* 709.4* 1,423.5*    ProBNP (last 3 results) No results for input(s): PROBNP in the last 8760 hours.   D-Dimer No results for input(s): DDIMER in the last 72 hours. Hemoglobin A1C No results for input(s): HGBA1C in the last 72 hours. Fasting Lipid Panel No results for input(s): CHOL, HDL, LDLCALC, TRIG, CHOLHDL, LDLDIRECT in the last 72 hours. Thyroid Function Tests No results for input(s): TSH, T4TOTAL, T3FREE, THYROIDAB in the last 72 hours.  Invalid input(s): FREET3  Other results:   Imaging    No results found.   Medications:     Scheduled Medications: . aspirin  81 mg Oral Daily  . carvedilol  3.125 mg Oral BID  WC  . enoxaparin (LOVENOX) injection  60 mg Subcutaneous Q24H  . ferrous gluconate  324 mg Oral Q breakfast  . insulin aspart  3 Units Subcutaneous TID AC  . insulin aspart  3-9 Units Subcutaneous TID AC & HS  . insulin detemir  8 Units Subcutaneous QHS  . ipratropium-albuterol  3 mL Nebulization TID  . isosorbide-hydrALAZINE  0.5 tablet Per Tube TID  . mouth rinse  15 mL Mouth Rinse BID  . pantoprazole  40 mg Oral Daily  . polyethylene glycol  17 g Per Tube Q M,W,F  . potassium chloride  40 mEq Oral Once  . Ensure Max Protein  11 oz Oral BID  . rosuvastatin  40 mg Oral q1800  . senna-docusate  1 tablet Oral BID  . sodium chloride flush  3 mL Intravenous Q12H  . spironolactone  25 mg Oral Daily  . torsemide  40 mg Oral Daily    Infusions: .  sodium chloride Stopped (10/31/18 2015)    PRN Medications: sodium chloride, acetaminophen, albuterol, hydrALAZINE, phenol, sodium chloride flush   Assessment/Plan   1. PEA arrest: Due to severe hypoxia on 5/30.  Now extubated, stable.  Chest pain likely due to CPR.  2. Acute on chronic systolic CHF: Suspect ischemic cardiomyopathy.  Echo in 1/20 with EF 40-45%, RV moderately dilated, PASP 71 mmHg.  Echo this admission with EF 30-35%, mildly dilated RV.  Suspect significant RV failure, may be related to OHS/OSA and COPD. She was massively volume overloaded and also likely has [place]. IV Lasix stopped yesterday.  Today, CVP 10.  Creatinine mildly higher at 2.67.   - Start torsemide 40 mg daily.  Will likely need higher dose at home but start slowly given rise in creatinine.    - Continue Bidil 1/2 tab tid and spironolactone 25 mg daily.  - Restart Coreg 3.125 mg bid.  - Will arrange for RHC on Monday. Discussed risks/benefits with patient and she agrees to procedure.   3. AKI: Baseline creatinine around 1.3, 2.67 today.  Suspect cardiorenal syndrome and possibly ATN from code. No off IV Lasix.   4. Acute hypoxic respiratory failure: Post-arrest, extubated 6/1. Now on nasal cannula.  3. COPD: On home oxygen at baseline.  Has quit smoking.  4. OHS/OSA: On home oxygen at all times and Bipap at night.  5. CAD: History of PCI, most recently had inferior STEMI in 5/15 with DES to RCA.  She has been having chest pain but chest wall is tender post-CPR, doubt ischemia.  - Continue ASA 81 and Crestor 40 daily - With fall in EF, will consider coronary angiography eventually if we can stabilize her creatinine.  6. Remote PE: Continue Lovenox for DVT prophylaxis.   7. Anemia: History of GI bleeding, had endoscopies earlier this year with no bleeding source found.  Also with history of B12 deficiency. Transferrin saturation low, got IV Fe.   8. Hypokalemia: Supplement aggressively.  9. Deconditioning: Work  with PT, cardiac rehab.   Length of Stay: 39  Loralie Champagne, MD  11/05/2018, 9:34 AM  Advanced Heart Failure Team Pager [phone] (M-F; 7a - 4p)  Please contact [facility] for night-coverage after hours (4p -7a ) and weekends on [email or web address]

## 2018-11-05 NOTE — H&P (View-Only) (Signed)
Patient ID: Tamara Silva, female   DOB: 1960-07-02, 57 y.o.   MRN: [id number]     Advanced Heart Failure Rounding Note  PCP-Cardiologist: Quay Burow, MD   Subjective:    Had prolonged PEA arrest on 5/30. CPR for 25 minutes. Moved to 4N.  Extubated 6/1.    Breathing gradually improving. IV Lasix stopped yesterday.   She has mild chest pain from CPR, this has improved.   Creatinine 1.74 -> 2.11 -> 2.21 -> 2.26 -> 2.26 -> 2.28 -> 2.3 -> 2.6 -> 2.67.    Echo: EF 30-35%, mild RV dilation.    Objective:   Weight Range: 123.6 kg Body mass index is 38 kg/m.   Vital Signs:   Temp:  [97.6 F (36.4 C)-98.9 F (37.2 C)] 97.6 F (36.4 C) (06/07 0747) Pulse Rate:  [71-84] 79 (06/07 0747) Resp:  [17-24] 18 (06/07 0747) BP: (110-126)/(58-69) 110/68 (06/07 0747) SpO2:  [95 %-100 %] 95 % (06/07 0847) FiO2 (%):  [40 %] 40 % (06/07 0325) Weight:  [123.6 kg] 123.6 kg (06/07 0407) Last BM Date: 11/03/18  Weight change: Filed Weights   11/03/18 0350 11/04/18 0340 11/05/18 0407  Weight: 133.8 kg 127.9 kg 123.6 kg    Intake/Output:   Intake/Output Summary (Last 24 hours) at 11/05/2018 0934 Last data filed at 11/04/2018 2349 Gross per 24 hour  Intake 617.7 ml  Output 1100 ml  Net -482.3 ml      Physical Exam    General: NAD, obese.  Neck: JVP 9-10 cm, no thyromegaly or thyroid nodule.  Lungs: Decreased BS at bases.  CV: Nondisplaced PMI.  Heart regular S1/S2, no S3/S4, no murmur.  No peripheral edema.  Abdomen: Soft, nontender, no hepatosplenomegaly, no distention.  Skin: Intact without lesions or rashes.  Neurologic: Alert and oriented x 3.  Psych: Normal affect. Extremities: No clubbing or cyanosis.  HEENT: Normal.    Telemetry   NSR 60s, personally reviewed.   Labs    CBC Recent Labs    11/04/18 0350 11/05/18 0438  WBC 9.6 10.0  HGB 10.7* 11.7*  HCT 35.0* 39.1  MCV 84.7 86.5  PLT 222 782   Basic Metabolic Panel Recent Labs    11/04/18 0350 11/05/18  0438  NA 137 138  K 4.1 3.3*  CL 77* 81*  CO2 45* 41*  GLUCOSE 120* 101*  BUN 68* 77*  CREATININE 2.61* 2.67*  CALCIUM 10.4* 10.0   Liver Function Tests No results for input(s): AST, ALT, ALKPHOS, BILITOT, PROT, ALBUMIN in the last 72 hours. No results for input(s): LIPASE, AMYLASE in the last 72 hours. Cardiac Enzymes No results for input(s): CKTOTAL, CKMB, CKMBINDEX, TROPONINI in the last 72 hours.  BNP: BNP (last 3 results) Recent Labs    07/08/18 2100 10/25/18 0309 10/28/18 1023  BNP 669.1* 709.4* 1,423.5*    ProBNP (last 3 results) No results for input(s): PROBNP in the last 8760 hours.   D-Dimer No results for input(s): DDIMER in the last 72 hours. Hemoglobin A1C No results for input(s): HGBA1C in the last 72 hours. Fasting Lipid Panel No results for input(s): CHOL, HDL, LDLCALC, TRIG, CHOLHDL, LDLDIRECT in the last 72 hours. Thyroid Function Tests No results for input(s): TSH, T4TOTAL, T3FREE, THYROIDAB in the last 72 hours.  Invalid input(s): FREET3  Other results:   Imaging    No results found.   Medications:     Scheduled Medications: . aspirin  81 mg Oral Daily  . carvedilol  3.125 mg Oral BID  WC  . enoxaparin (LOVENOX) injection  60 mg Subcutaneous Q24H  . ferrous gluconate  324 mg Oral Q breakfast  . insulin aspart  3 Units Subcutaneous TID AC  . insulin aspart  3-9 Units Subcutaneous TID AC & HS  . insulin detemir  8 Units Subcutaneous QHS  . ipratropium-albuterol  3 mL Nebulization TID  . isosorbide-hydrALAZINE  0.5 tablet Per Tube TID  . mouth rinse  15 mL Mouth Rinse BID  . pantoprazole  40 mg Oral Daily  . polyethylene glycol  17 g Per Tube Q M,W,F  . potassium chloride  40 mEq Oral Once  . Ensure Max Protein  11 oz Oral BID  . rosuvastatin  40 mg Oral q1800  . senna-docusate  1 tablet Oral BID  . sodium chloride flush  3 mL Intravenous Q12H  . spironolactone  25 mg Oral Daily  . torsemide  40 mg Oral Daily    Infusions: .  sodium chloride Stopped (10/31/18 2015)    PRN Medications: sodium chloride, acetaminophen, albuterol, hydrALAZINE, phenol, sodium chloride flush   Assessment/Plan   1. PEA arrest: Due to severe hypoxia on 5/30.  Now extubated, stable.  Chest pain likely due to CPR.  2. Acute on chronic systolic CHF: Suspect ischemic cardiomyopathy.  Echo in 1/20 with EF 40-45%, RV moderately dilated, PASP 71 mmHg.  Echo this admission with EF 30-35%, mildly dilated RV.  Suspect significant RV failure, may be related to OHS/OSA and COPD. She was massively volume overloaded and also likely has [place]. IV Lasix stopped yesterday.  Today, CVP 10.  Creatinine mildly higher at 2.67.   - Start torsemide 40 mg daily.  Will likely need higher dose at home but start slowly given rise in creatinine.    - Continue Bidil 1/2 tab tid and spironolactone 25 mg daily.  - Restart Coreg 3.125 mg bid.  - Will arrange for RHC on Monday. Discussed risks/benefits with patient and she agrees to procedure.   3. AKI: Baseline creatinine around 1.3, 2.67 today.  Suspect cardiorenal syndrome and possibly ATN from code. No off IV Lasix.   4. Acute hypoxic respiratory failure: Post-arrest, extubated 6/1. Now on nasal cannula.  3. COPD: On home oxygen at baseline.  Has quit smoking.  4. OHS/OSA: On home oxygen at all times and Bipap at night.  5. CAD: History of PCI, most recently had inferior STEMI in 5/15 with DES to RCA.  She has been having chest pain but chest wall is tender post-CPR, doubt ischemia.  - Continue ASA 81 and Crestor 40 daily - With fall in EF, will consider coronary angiography eventually if we can stabilize her creatinine.  6. Remote PE: Continue Lovenox for DVT prophylaxis.   7. Anemia: History of GI bleeding, had endoscopies earlier this year with no bleeding source found.  Also with history of B12 deficiency. Transferrin saturation low, got IV Fe.   8. Hypokalemia: Supplement aggressively.  9. Deconditioning: Work  with PT, cardiac rehab.   Length of Stay: 74  Loralie Champagne, MD  11/05/2018, 9:34 AM  Advanced Heart Failure Team Pager [phone] (M-F; 7a - 4p)  Please contact [facility] for night-coverage after hours (4p -7a ) and weekends on [email or web address]

## 2018-11-06 ENCOUNTER — Encounter (HOSPITAL_COMMUNITY): Payer: Self-pay | Admitting: Cardiology

## 2018-11-06 ENCOUNTER — Encounter (HOSPITAL_COMMUNITY)
Admission: EM | Disposition: A | Discharge status: Home Health | Payer: Self-pay | Source: Home / Self Care | Attending: Internal Medicine

## 2018-11-06 DIAGNOSIS — I509 Heart failure, unspecified: Secondary | ICD-10-CM

## 2018-11-06 HISTORY — PX: RIGHT HEART CATH: CATH118263

## 2018-11-06 LAB — GLUCOSE, CAPILLARY
Glucose-Capillary: 107 mg/dL — ABNORMAL HIGH (ref 70–99)
Glucose-Capillary: 121 mg/dL — ABNORMAL HIGH (ref 70–99)
Glucose-Capillary: 144 mg/dL — ABNORMAL HIGH (ref 70–99)
Glucose-Capillary: 148 mg/dL — ABNORMAL HIGH (ref 70–99)

## 2018-11-06 LAB — BASIC METABOLIC PANEL
Anion gap: 14 (ref 5–15)
BUN: 83 mg/dL — ABNORMAL HIGH (ref 6–20)
CO2: 40 mmol/L — ABNORMAL HIGH (ref 22–32)
Calcium: 9.7 mg/dL (ref 8.9–10.3)
Chloride: 82 mmol/L — ABNORMAL LOW (ref 98–111)
Creatinine, Ser: 2.78 mg/dL — ABNORMAL HIGH (ref 0.44–1.00)
GFR calc Af Amer: 21 mL/min — ABNORMAL LOW (ref >60)
GFR calc non Af Amer: 18 mL/min — ABNORMAL LOW (ref >60)
Glucose, Bld: 107 mg/dL — ABNORMAL HIGH (ref 70–99)
Potassium: 3.7 mmol/L (ref 3.5–5.1)
Sodium: 136 mmol/L (ref 135–145)

## 2018-11-06 LAB — POCT I-STAT EG7
Acid-Base Excess: 17 mmol/L — ABNORMAL HIGH (ref 0.0–2.0)
Acid-Base Excess: 18 mmol/L — ABNORMAL HIGH (ref 0.0–2.0)
Bicarbonate: 45.6 mmol/L — ABNORMAL HIGH (ref 20.0–28.0)
Bicarbonate: 46.2 mmol/L — ABNORMAL HIGH (ref 20.0–28.0)
Calcium, Ion: 1.16 mmol/L (ref 1.15–1.40)
Calcium, Ion: 1.18 mmol/L (ref 1.15–1.40)
HCT: 39 % (ref 36.0–46.0)
HCT: 40 % (ref 36.0–46.0)
Hemoglobin: 13.3 g/dL (ref 12.0–15.0)
Hemoglobin: 13.6 g/dL (ref 12.0–15.0)
O2 Saturation: 75 %
O2 Saturation: 76 %
Potassium: 3.4 mmol/L — ABNORMAL LOW (ref 3.5–5.1)
Potassium: 3.4 mmol/L — ABNORMAL LOW (ref 3.5–5.1)
Sodium: 138 mmol/L (ref 135–145)
Sodium: 139 mmol/L (ref 135–145)
TCO2: 48 mmol/L — ABNORMAL HIGH (ref 22–32)
TCO2: 48 mmol/L — ABNORMAL HIGH (ref 22–32)
pCO2, Ven: 72.2 mmHg (ref 44.0–60.0)
pCO2, Ven: 72.6 mmHg (ref 44.0–60.0)
pH, Ven: 7.409 (ref 7.250–7.430)
pH, Ven: 7.411 (ref 7.250–7.430)
pO2, Ven: 42 mmHg (ref 32.0–45.0)
pO2, Ven: 43 mmHg (ref 32.0–45.0)

## 2018-11-06 LAB — CBC
HCT: 36.4 % (ref 36.0–46.0)
Hemoglobin: 11.4 g/dL — ABNORMAL LOW (ref 12.0–15.0)
MCH: 26.4 pg (ref 26.0–34.0)
MCHC: 31.3 g/dL (ref 30.0–36.0)
MCV: 84.3 fL (ref 80.0–100.0)
Platelets: 228 10*3/uL (ref 150–400)
RBC: 4.32 MIL/uL (ref 3.87–5.11)
RDW: 17.2 % — ABNORMAL HIGH (ref 11.5–15.5)
WBC: 10.2 10*3/uL (ref 4.0–10.5)
nRBC: 0 % (ref 0.0–0.2)

## 2018-11-06 SURGERY — RIGHT HEART CATH
Anesthesia: LOCAL

## 2018-11-06 MED ORDER — HEPARIN (PORCINE) IN NACL 1000-0.9 UT/500ML-% IV SOLN
INTRAVENOUS | Status: AC
  Filled 2018-11-06: qty 500

## 2018-11-06 MED ORDER — LIDOCAINE HCL (PF) 1 % IJ SOLN
INTRAMUSCULAR | Status: DC | PRN
  Administered 2018-11-06: 2 mL via INTRADERMAL

## 2018-11-06 MED ORDER — INSULIN DETEMIR 100 UNIT/ML ~~LOC~~ SOLN
12.0000 [IU] | Freq: Every day | SUBCUTANEOUS | Status: DC
  Administered 2018-11-06: 12 [IU] via SUBCUTANEOUS
  Filled 2018-11-06 (×2): qty 0.12

## 2018-11-06 MED ORDER — LIDOCAINE HCL (PF) 1 % IJ SOLN
INTRAMUSCULAR | Status: AC
  Filled 2018-11-06: qty 30

## 2018-11-06 SURGICAL SUPPLY — 6 items
CATH BALLN WEDGE 5F 110CM (CATHETERS) ×2 IMPLANT
KIT HEART LEFT (KITS) ×2 IMPLANT
PACK CARDIAC CATHETERIZATION (CUSTOM PROCEDURE TRAY) ×2 IMPLANT
SHEATH GLIDE SLENDER 4/5FR (SHEATH) ×2 IMPLANT
TRANSDUCER W/STOPCOCK (MISCELLANEOUS) ×2 IMPLANT
TUBING CIL FLEX 10 FLL-RA (TUBING) ×2 IMPLANT

## 2018-11-06 NOTE — Interval H&P Note (Signed)
History and Physical Interval Note:  11/06/2018 7:40 AM  Tamara Silva  has presented today for surgery, with the diagnosis of HF.  The various methods of treatment have been discussed with the patient and family. After consideration of risks, benefits and other options for treatment, the patient has consented to  Procedure(s): RIGHT HEART CATH (N/A) as a surgical intervention.  The patient's history has been reviewed, patient examined, no change in status, stable for surgery.  I have reviewed the patient's chart and labs.  Questions were answered to the patient's satisfaction.     Tamara Silva [employer]

## 2018-11-06 NOTE — Progress Notes (Signed)
Patient ID: Tamara Silva, female   DOB: 03/20/1961, 58 y.o.   MRN: [id number]     Advanced Heart Failure Rounding Note  PCP-Cardiologist: Quay Burow, MD   Subjective:    Had prolonged PEA arrest on 5/30. CPR for 25 minutes. Moved to 4N.  Extubated 6/1.    Breathing gradually improving. She is now on po torsemide.  Has not been out of bed much.   She has mild chest pain from CPR, this has improved.   Creatinine 1.74 -> 2.11 -> 2.21 -> 2.26 -> 2.26 -> 2.28 -> 2.3 -> 2.6 -> 2.67 -> 2.78.    Echo: EF 30-35%, mild RV dilation.   RHC Procedural Findings: Hemodynamics (mmHg) RA mean 9 RV 60/12 PA 62/22, mean 37 PCWP mean 15 Oxygen saturations: PA 76% AO 96% Cardiac Output (Fick) 10.4  Cardiac Index (Fick) 4.29 PVR 2.1 WU   Objective:   Weight Range: 125.5 kg Body mass index is 38.59 kg/m.   Vital Signs:   Temp:  [97.5 F (36.4 C)-98.2 F (36.8 C)] 97.6 F (36.4 C) (06/08 0339) Pulse Rate:  [70-78] 74 (06/08 0803) Resp:  [16-33] 18 (06/08 0803) BP: (112-130)/(49-67) 130/54 (06/08 0803) SpO2:  [93 %-100 %] 96 % (06/08 0803) FiO2 (%):  [40 %] 40 % (06/08 0409) Weight:  [125.5 kg] 125.5 kg (06/08 0243) Last BM Date: 11/06/18  Weight change: Filed Weights   11/04/18 0340 11/05/18 0407 11/06/18 0243  Weight: 127.9 kg 123.6 kg 125.5 kg    Intake/Output:   Intake/Output Summary (Last 24 hours) at 11/06/2018 0809 Last data filed at 11/05/2018 2315 Gross per 24 hour  Intake 480 ml  Output 1000 ml  Net -520 ml      Physical Exam    General: NAD Neck: JVP 8-9 cm, no thyromegaly or thyroid nodule.  Lungs: Clear to auscultation bilaterally with normal respiratory effort. CV: Nondisplaced PMI.  Heart regular S1/S2, no S3/S4, no murmur.  No peripheral edema.   Abdomen: Soft, nontender, no hepatosplenomegaly, no distention.  Skin: Intact without lesions or rashes.  Neurologic: Alert and oriented x 3.  Psych: Normal affect. Extremities: No clubbing or cyanosis.   HEENT: Normal.     Telemetry   NSR 60s, personally reviewed.   Labs    CBC Recent Labs    11/05/18 0438 11/06/18 0409  WBC 10.0 10.2  HGB 11.7* 11.4*  HCT 39.1 36.4  MCV 86.5 84.3  PLT 234 814   Basic Metabolic Panel Recent Labs    11/05/18 0438 11/06/18 0409  NA 138 136  K 3.3* 3.7  CL 81* 82*  CO2 41* 40*  GLUCOSE 101* 107*  BUN 77* 83*  CREATININE 2.67* 2.78*  CALCIUM 10.0 9.7   Liver Function Tests No results for input(s): AST, ALT, ALKPHOS, BILITOT, PROT, ALBUMIN in the last 72 hours. No results for input(s): LIPASE, AMYLASE in the last 72 hours. Cardiac Enzymes No results for input(s): CKTOTAL, CKMB, CKMBINDEX, TROPONINI in the last 72 hours.  BNP: BNP (last 3 results) Recent Labs    07/08/18 2100 10/25/18 0309 10/28/18 1023  BNP 669.1* 709.4* 1,423.5*    ProBNP (last 3 results) No results for input(s): PROBNP in the last 8760 hours.   D-Dimer No results for input(s): DDIMER in the last 72 hours. Hemoglobin A1C No results for input(s): HGBA1C in the last 72 hours. Fasting Lipid Panel No results for input(s): CHOL, HDL, LDLCALC, TRIG, CHOLHDL, LDLDIRECT in the last 72 hours. Thyroid Function Tests No results  for input(s): TSH, T4TOTAL, T3FREE, THYROIDAB in the last 72 hours.  Invalid input(s): FREET3  Other results:   Imaging    No results found.   Medications:     Scheduled Medications: . [MAR Hold] aspirin  81 mg Oral Daily  . [MAR Hold] carvedilol  3.125 mg Oral BID WC  . [MAR Hold] dextromethorphan-guaiFENesin  1 tablet Oral BID  . [MAR Hold] enoxaparin (LOVENOX) injection  60 mg Subcutaneous Q24H  . [MAR Hold] ferrous gluconate  324 mg Oral Q breakfast  . [MAR Hold] insulin aspart  3 Units Subcutaneous TID AC  . [MAR Hold] insulin aspart  3-9 Units Subcutaneous TID AC & HS  . [MAR Hold] insulin detemir  8 Units Subcutaneous QHS  . [MAR Hold] ipratropium-albuterol  3 mL Nebulization TID  . [MAR Hold]  isosorbide-hydrALAZINE  0.5 tablet Per Tube TID  . [MAR Hold] mouth rinse  15 mL Mouth Rinse BID  . [MAR Hold] pantoprazole  40 mg Oral Daily  . [MAR Hold] polyethylene glycol  17 g Per Tube Q M,W,F  . [MAR Hold] potassium chloride  20 mEq Oral Daily  . [MAR Hold] Ensure Max Protein  11 oz Oral BID  . [MAR Hold] rosuvastatin  40 mg Oral q1800  . [MAR Hold] senna-docusate  1 tablet Oral BID  . [MAR Hold] sodium chloride flush  3 mL Intravenous Q12H  . [MAR Hold] spironolactone  25 mg Oral Daily  . [MAR Hold] torsemide  40 mg Oral Daily    Infusions: . [MAR Hold] sodium chloride Stopped (10/31/18 2015)  . sodium chloride    . sodium chloride 10 mL/hr at 11/06/18 0627    PRN Medications: [MAR Hold] sodium chloride, sodium chloride, [MAR Hold] acetaminophen, [MAR Hold] albuterol, [MAR Hold] hydrALAZINE, lidocaine (PF), [MAR Hold] phenol, [MAR Hold] sodium chloride flush, sodium chloride flush   Assessment/Plan   1. PEA arrest: Due to severe hypoxia on 5/30.  Now extubated, stable.  Chest pain likely due to CPR.  2. Acute on chronic systolic CHF: Suspect ischemic cardiomyopathy.  Echo in 1/20 with EF 40-45%, RV moderately dilated, PASP 71 mmHg.  Echo this admission with EF 30-35%, mildly dilated RV.  Suspect significant RV failure, may be related to OHS/OSA and COPD. She was massively volume overloaded and also likely has [place]. IV Lasix stopped 6/6.  RHC done today shows high cardiac output and minimally elevated filling pressures.  Creatinine mildly higher at 2.78.   - Hold po torsemide today, hopefully restart tomorrow.     - Continue Bidil 1/2 tab tid and spironolactone 25 mg daily.  - Continue Coreg 3.125 mg bid.  3. AKI: Baseline creatinine around 1.3, 2.78 today.  Suspect cardiorenal syndrome and possibly ATN from code. Now off IV Lasix.   4. Acute hypoxic respiratory failure: Post-arrest, extubated 6/1. Now on nasal cannula.  3. COPD: On home oxygen at baseline.  Has quit smoking.   4. OHS/OSA: On home oxygen at all times and Bipap at night.  5. CAD: History of PCI, most recently had inferior STEMI in 5/15 with DES to RCA.  She has been having chest pain but chest wall was tender post-CPR, doubt ischemia.  - Continue ASA 81 and Crestor 40 daily - With fall in EF, will consider coronary angiography eventually if we can stabilize her creatinine.  6. Remote PE: Continue Lovenox for DVT prophylaxis.   7. Anemia: History of GI bleeding, had endoscopies earlier this year with no bleeding source found.  Also with history of B12 deficiency. Transferrin saturation low, got IV Fe.   8. Hypokalemia: Supplement aggressively.  9. Pulmonary hypertension: Looks like high output PH by RHC.  10. Deconditioning: Work with PT, cardiac rehab.   Suspect she may be able to go home tomorrow if creatinine stabilizes and she is mobile enough.   Length of Stay: 58  Loralie Champagne, MD  11/06/2018, 8:09 AM  Advanced Heart Failure Team Pager [phone] (M-F; 7a - 4p)  Please contact [facility] for night-coverage after hours (4p -7a ) and weekends on [email or web address]

## 2018-11-06 NOTE — Plan of Care (Signed)
  Problem: Education: Goal: Knowledge of General Education information will improve Description Including pain rating scale, medication(s)/side effects and non-pharmacologic comfort measures Outcome: Progressing   Problem: Clinical Measurements: Goal: Ability to maintain clinical measurements within normal limits will improve Outcome: Progressing Goal: Will remain free from infection Outcome: Progressing Goal: Cardiovascular complication will be avoided Outcome: Progressing   Problem: Nutrition: Goal: Adequate nutrition will be maintained Outcome: Progressing   Problem: Coping: Goal: Level of anxiety will decrease Outcome: Progressing   Problem: Elimination: Goal: Will not experience complications related to urinary retention Outcome: Progressing   Problem: Pain Managment: Goal: General experience of comfort will improve Outcome: Progressing   Problem: Safety: Goal: Ability to remain free from injury will improve Outcome: Progressing   Problem: Skin Integrity: Goal: Risk for impaired skin integrity will decrease Outcome: Progressing

## 2018-11-06 NOTE — Plan of Care (Signed)
Patient is slow to progress.  Plans for RHC later this morning - cardiology suspects PAH and potential significant RV failure; will evaluate per cath.  Patient remains difficult to motivate; often refusing increased mobility opportunities stating she is tired, wants to sleep.  However, patient can do bed to chair transfers w/minimum CG assist if she wants to.  Patient frequently requests to simply lay in bed w/BiPAP on.  Educated on importance of increased activity tolerance if she plans to go home w/family.  Patient refusing SNF.  States she has family to care for her at home.  Will continue to evaluate needs post RHC this AM.

## 2018-11-06 NOTE — Progress Notes (Signed)
CARDIAC REHAB PHASE I   PRE:  Rate/Rhythm: 72 SR    BP: sitting 129/65    SaO2: 98 after bipap  MODE:  Ambulation: 68 ft   POST:  Rate/Rhythm: 82 SR    BP: sitting 127/72     SaO2: 97 4L  Pt awoke and willing to walk. Moved to EOB fairly independently. Stood and transferred to [facility] then ambulated with RW, 4L, and standby assist x2. She fatigues easily and had to sit and roll to room at door of the room. She sts her chest was tired. SaO2 maintained high 90s. Pt requested 3L in recliner, even though SaO2 98 2L. Left HF booklet for pt to read. She sts she has another at home. Will review ed tomorrow. [place], ACSM 11/06/2018 3:16 PM

## 2018-11-06 NOTE — Progress Notes (Signed)
PROGRESS NOTE    Tamara Silva  QZR:[id number] DOB: 1961-01-09 DOA: 10/25/2018 PCP: Antony Blackbird, MD  Brief Narrative: 58 year old with a history of systolic CHF ejection fraction 40-45%- January 2020, obesity hypoventilation on BiPAP at bedtime, diabetes mellitus type 2, CKD, CAD, recent CVA requiring intubation for seizure presented for increasing shortness of breath and peripheral edema.  Chest x-ray overall unremarkable.  BNP slightly elevated, creatinine elevated from baseline of 1.3 to1.8.  Started on IV diuretics.  Also some component of COPD exacerbation. Cardiology consulted give her adv cardiac issues. Due to signficant amount of overload, PICC was placed to monitor CVP and CoOx, started on Lasix drip.  On 5/30 morning patient suddenly became unresponsive and concerns for seizure which lasted for more than 20 minutes.  She was given 2 mg of IV Ativan and CT of the head was ordered.  While she was getting CT of the head, patient became hypoxic leading to PEA cardiac arrest.  1 round of CPR was performed with regain of pulse but soon after went back again cardiac arrest requiring another round of CPR.  She was transferred to ICU after being intubated.  Neurology and critical care team were consulted. Patient extubated successfully 10/30/18. Transferred back to medicine/cardiology teams for ongoing management and diuresis.  Assessment & Plan:   Principal Problem:   Acute on chronic diastolic CHF (congestive heart failure) (HCC) Active Problems:   Morbid obesity ([place])   CAD S/P percutaneous coronary angioplasty - prior PCI to LAD; RCA PCI: new Xience [place] DES 2.75 mm x 15 mm    COPD exacerbation (HCC)   CKD (chronic kidney disease), stage II   Acute respiratory failure with hypoxia (HCC)   Uncontrolled type 2 diabetes mellitus without complication, with long-term current use of insulin (HCC)   OSA (obstructive sleep apnea)   Tobacco abuse disorder   Hypertension   Iron deficiency anemia    Diabetes mellitus type 2 in obese (HCC)   Slow transit constipation   Cardiac arrest (HCC)   Seizure ([place])   Acute combined diastolic and systolic congestive heart failure with ejection fraction 35% grade 2 diastolic dysfunction, class III, resolving - Heart failure team continues to follow, we appreciate insight and recommendations  - CVP trending downward appropriately.   - Agree with holding torsemide given ongoing elevated creatinine as below - Continues to diurese quite well - negative nearly 20L since admission - Continue Bidil, carvedilol and spironolactone  - S/P RHC today shows minimally elevated filling pressures and high cardiac output  Acute on chronic hypoxic hypercarbic respiratory failure previously requiring intubation, successfully extubated 10/30/2018 Cardiac arrest, likely PEA arrest with spontaneous return of circulation Acute metabolic encephalopathy, resolved questionably seizure-like activity versus hypoxia and hypercarbia as above - Patient continues to require Bipap HS and PRN; continue to wean oxygen as tolerated - pt reports 4L [place] use at home even at rest and CPAP at night - Patient previously weaned off oxygen while at rest but became somewhat anxious despite sats being within normal limits -patient sating 97% on RA today - she desated into the 80s when she fell asleep off bipap. - Neurology not currently recommending antiepileptic meds, EEG unremarkable previously - Cardiology/heart failure team following along as above  Acute kidney injury on CKD stage III; suspect Cardiorenal vs hypoperfusion - Baseline creatinine around 1.3, currently approaching 2.78 - follow closely - Off IV lasix - off torsemide resumed by cardiology - Likely exacerbated by previous PEA arrest and hypotension  Hypokalemia, in  the setting of above - stable - Secondary to above aggressive diuresis, replete as appropriate - Continue 43meq daily and adjust as necessary - Likely to  improve on spironolactone now as lasix is being weaned back to baseline  Diabetes mellitus type 2 - On insulin sliding scale, Increase levemir to 12u per day - Continue 3u premeal + sliding scale insulin/achs glucose checks and hypoglycemic protocol  Obesity hypoventilation syndrome on BiPAP at bedtime - Continue bipap HS/PRN - tolerating well - Lengthy discussion at bedside about need for dietary improvement and exercise regimen  Anemia of chronic disease, Fe deficiency, and B12 deficiency, stable - Continue iron, B12 supplementation  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 Suspect an additional 24-48 hours of ongoing diuresis, close monitoring on telemetry, supplemental oxygen and physical therapy. Possible RHC planned 11/06/18 per cardiology. Disposition pending findings on cath. PT recommending SNF at this time but pt continues to insist on DC home.  Consultants:   Cardiology  Subjective: No acute issues or events overnight, tolerating BiPAP well currently, diet improving somewhat over the past 24 hours, denies fevers, chills, shortness of breath, headache, nausea, vomiting, diarrhea.  Does report ongoing musculoskeletal chest pain worse with inspiration and exertion but this too is improving with activity.  Objective: Vitals:   11/06/18 0758 11/06/18 0803 11/06/18 0838 11/06/18 1504  BP: (!) 130/56 (!) 130/54 122/63   Pulse: 74 74 73   Resp: 17 18 18     Temp:   97.6 F (36.4 C)   TempSrc:   Oral   SpO2: 96% 96% 96% 98%  Weight:      Height:        Intake/Output Summary (Last 24 hours) at 11/06/2018 1525 Last data filed at 11/06/2018 1138 Gross per 24 hour  Intake 243 ml  Output 500 ml  Net -257 ml   Filed Weights   11/04/18 0340 11/05/18 0407 11/06/18 0243  Weight: 127.9 kg 123.6 kg 125.5 kg    Examination:  General:  Pleasantly resting in bed, No acute distress. HEENT:  Normocephalic atraumatic. Sclerae nonicteric, noninjected. Extraocular movements intact bilaterally. Neck: Without mass or deformity. Trachea is midline. Lungs: Clear to auscultate bilaterally without rhonchi, wheeze, or rales. Moderate tenderness to palpation over chest. Heart: Regular rate and rhythm. Without murmurs, rubs, or gallops. Abdomen: Soft, nontender, nondistended. Without guarding or rebound. Extremities: Without cyanosis, clubbing, obvious deformity. Vascular: Dorsalis pedis and posterior tibial pulses palpable bilaterally. Skin: Warm and dry, no erythema, no ulcerations.  PICC line right upper extremity.  Data Reviewed: I have personally reviewed following labs and imaging studies  CBC: Recent Labs  Lab 11/02/18 0920 11/03/18 0355 11/04/18 0350 11/05/18 0438 11/06/18 0409  WBC 7.2 7.2 9.6 10.0 10.2  HGB 10.0* 9.7* 10.7* 11.7* 11.4*  HCT 32.8* 31.6* 35.0* 39.1 36.4  MCV 85.6 85.2 84.7 86.5 84.3  PLT 213 198 222 234 878   Basic Metabolic Panel: Recent Labs  Lab 11/02/18 0920 11/03/18 0355 11/04/18 0350 11/05/18 0438 11/06/18 0409  NA 140 136 137 138 136  K 3.3* 3.3* 4.1 3.3* 3.7  CL 79* 74* 77* 81* 82*  CO2 >50* 46* 45* 41* 40*  GLUCOSE 240* 230* 120* 101* 107*  BUN 69* 67* 68* 77* 83*  CREATININE 2.23* 2.30* 2.61* 2.67* 2.78*  CALCIUM 9.9 9.7 10.4* 10.0 9.7   GFR: Estimated Creatinine Clearance: 32.3 mL/min (A) (by C-G formula based on SCr of 2.78 mg/dL (H)). Liver Function Tests: Recent Labs  Lab 11/01/18 0320  AST  27  ALT 16  ALKPHOS 111  BILITOT 1.6*  PROT 9.0*  ALBUMIN 2.9*   No results for input(s): LIPASE, AMYLASE in the last 168 hours. No results for input(s): AMMONIA in the last 168 hours. Coagulation Profile: No results for input(s): INR, PROTIME in the last 168 hours. Cardiac Enzymes: No results for input(s): CKTOTAL, CKMB, CKMBINDEX, TROPONINI in the last 168 hours. BNP (last 3 results) No results for input(s): PROBNP in the last 8760 hours. HbA1C: No results for input(s): HGBA1C in the last 72 hours. CBG: Recent Labs  Lab 11/05/18 1206 11/05/18 1603 11/05/18 2202 11/06/18 0612 11/06/18 1102  GLUCAP 175* 161* 127* 107* 121*   Lipid Profile: No results for input(s): CHOL, HDL, LDLCALC, TRIG, CHOLHDL, LDLDIRECT in the last 72 hours. Thyroid Function Tests: No results for input(s): TSH, T4TOTAL, FREET4, T3FREE, THYROIDAB in the last 72 hours. Anemia Panel: No results for input(s): VITAMINB12, FOLATE, FERRITIN, TIBC, IRON, RETICCTPCT in the last 72 hours. Sepsis Labs: No results for input(s): PROCALCITON, LATICACIDVEN in the last 168 hours.  Recent Results (from the past 240 hour(s))  MRSA PCR Screening     Status: None   Collection Time: 10/28/18  1:22 PM  Result Value Ref Range Status   MRSA by PCR NEGATIVE NEGATIVE Final    Comment:        The GeneXpert MRSA Assay (FDA approved for NASAL specimens only), is one component of a comprehensive MRSA colonization surveillance program. It is not intended to diagnose MRSA infection nor to guide or monitor treatment for MRSA infections. Performed at [facility], [place] [address] [place], [place] [address]          Radiology Studies: No results found. Scheduled Meds: . aspirin  81 mg Oral Daily  . carvedilol  3.125 mg Oral BID WC  . dextromethorphan-guaiFENesin  1 tablet Oral BID  . enoxaparin (LOVENOX) injection  60 mg Subcutaneous Q24H  . ferrous gluconate  324 mg Oral Q breakfast  . insulin aspart  3  Units Subcutaneous TID AC  . insulin aspart  3-9 Units Subcutaneous TID AC & HS  . insulin detemir  8 Units Subcutaneous QHS  . ipratropium-albuterol  3 mL Nebulization TID  . isosorbide-hydrALAZINE  0.5 tablet Per Tube TID  . mouth rinse  15 mL Mouth Rinse BID  . pantoprazole  40 mg Oral Daily  . polyethylene glycol  17 g Per Tube Q M,W,F  . potassium chloride  20 mEq Oral Daily  . Ensure Max Protein  11 oz Oral BID  . rosuvastatin  40 mg Oral q1800  . senna-docusate  1 tablet Oral BID  . sodium chloride flush  3 mL Intravenous Q12H  . spironolactone  25 mg Oral Daily  . torsemide  40 mg Oral Daily   Continuous Infusions: . sodium chloride Stopped (10/31/18 2015)     LOS: 12 days    Time spent: 75min  Dewayne Jurek C Damarys Speir, DO [facility] Pager [phone]  If 7PM-7AM, please contact night-coverage [email or web address] Password  [email or web address] 11/06/2018, 3:25 PM

## 2018-11-06 NOTE — Interval H&P Note (Signed)
History and Physical Interval Note:  11/06/2018 7:40 AM  Tamara Silva  has presented today for surgery, with the diagnosis of HF.  The various methods of treatment have been discussed with the patient and family. After consideration of risks, benefits and other options for treatment, the patient has consented to  Procedure(s): RIGHT HEART CATH (N/A) as a surgical intervention.  The patient's history has been reviewed, patient examined, no change in status, stable for surgery.  I have reviewed the patient's chart and labs.  Questions were answered to the patient's satisfaction.     Raffaele Derise [employer]

## 2018-11-07 LAB — GLUCOSE, CAPILLARY
Glucose-Capillary: 124 mg/dL — ABNORMAL HIGH (ref 70–99)
Glucose-Capillary: 182 mg/dL — ABNORMAL HIGH (ref 70–99)

## 2018-11-07 LAB — CBC
HCT: 35.2 % — ABNORMAL LOW (ref 36.0–46.0)
Hemoglobin: 10.9 g/dL — ABNORMAL LOW (ref 12.0–15.0)
MCH: 26.3 pg (ref 26.0–34.0)
MCHC: 31 g/dL (ref 30.0–36.0)
MCV: 84.8 fL (ref 80.0–100.0)
Platelets: 222 10*3/uL (ref 150–400)
RBC: 4.15 MIL/uL (ref 3.87–5.11)
RDW: 17.2 % — ABNORMAL HIGH (ref 11.5–15.5)
WBC: 10.4 10*3/uL (ref 4.0–10.5)
nRBC: 0 % (ref 0.0–0.2)

## 2018-11-07 LAB — BASIC METABOLIC PANEL
Anion gap: 13 (ref 5–15)
BUN: 91 mg/dL — ABNORMAL HIGH (ref 6–20)
CO2: 38 mmol/L — ABNORMAL HIGH (ref 22–32)
Calcium: 9.5 mg/dL (ref 8.9–10.3)
Chloride: 87 mmol/L — ABNORMAL LOW (ref 98–111)
Creatinine, Ser: 2.65 mg/dL — ABNORMAL HIGH (ref 0.44–1.00)
GFR calc Af Amer: 22 mL/min — ABNORMAL LOW (ref >60)
GFR calc non Af Amer: 19 mL/min — ABNORMAL LOW (ref >60)
Glucose, Bld: 122 mg/dL — ABNORMAL HIGH (ref 70–99)
Potassium: 3.6 mmol/L (ref 3.5–5.1)
Sodium: 138 mmol/L (ref 135–145)

## 2018-11-07 MED ORDER — ASPIRIN 81 MG PO CHEW: 81.0000 mg | CHEWABLE_TABLET | Freq: Every day | ORAL | 0 refills | Status: DC

## 2018-11-07 MED ORDER — ENSURE MAX PROTEIN PO LIQD: 11.0000 [oz_av] | Freq: Two times a day (BID) | ORAL | 0 refills | Status: AC

## 2018-11-07 MED ORDER — SPIRONOLACTONE 25 MG PO TABS: 12.5000 mg | ORAL_TABLET | Freq: Every day | ORAL | 0 refills | Status: DC

## 2018-11-07 MED ORDER — POTASSIUM CHLORIDE CRYS ER 20 MEQ PO TBCR
20.0000 meq | EXTENDED_RELEASE_TABLET | Freq: Every day | ORAL | Status: DC
  Administered 2018-11-07: 11:00:00 20 meq via ORAL

## 2018-11-07 MED ORDER — ISOSORB DINITRATE-HYDRALAZINE 20-37.5 MG PO TABS: 0.5000 | ORAL_TABLET | Freq: Three times a day (TID) | ORAL | 0 refills | Status: DC

## 2018-11-07 MED ORDER — SPIRONOLACTONE 25 MG PO TABS: 25.0000 mg | ORAL_TABLET | Freq: Every day | ORAL | 0 refills | Status: DC

## 2018-11-07 MED ORDER — POTASSIUM CHLORIDE CRYS ER 20 MEQ PO TBCR: 20.0000 meq | EXTENDED_RELEASE_TABLET | Freq: Every day | ORAL | 0 refills | Status: DC

## 2018-11-07 MED ORDER — INSULIN DETEMIR 100 UNIT/ML ~~LOC~~ SOLN: 12.0000 [IU] | Freq: Every day | SUBCUTANEOUS | 11 refills | Status: DC

## 2018-11-07 MED ORDER — TORSEMIDE 20 MG PO TABS
40.0000 mg | ORAL_TABLET | Freq: Every day | ORAL | Status: DC
  Administered 2018-11-07: 40 mg via ORAL

## 2018-11-07 MED ORDER — TORSEMIDE 20 MG PO TABS: 40.0000 mg | ORAL_TABLET | Freq: Every day | ORAL | 0 refills | Status: DC

## 2018-11-07 MED FILL — Heparin Sod (Porcine)-NaCl IV Soln 1000 Unit/500ML-0.9%: INTRAVENOUS | Qty: 500 | Status: AC

## 2018-11-07 NOTE — Progress Notes (Signed)
Patient ID: Tamara Silva, female   DOB: 12-Feb-1961, 58 y.o.   MRN: [id number]     Advanced Heart Failure Rounding Note  PCP-Cardiologist: Quay Burow, MD   Subjective:    Had prolonged PEA arrest on 5/30. CPR for 25 minutes. Moved to 4N.  Extubated 6/1.    Breathing gradually improving. She is now on po torsemide.  Walked in hall with PT this morning. Breathing better.  CVP not hooked up currently but 12 per nurse this morning.   Creatinine 1.74 -> 2.11 -> 2.21 -> 2.26 -> 2.26 -> 2.28 -> 2.3 -> 2.6 -> 2.67 -> 2.78 -> 2.65.    Echo: EF 30-35%, mild RV dilation.   RHC Procedural Findings: Hemodynamics (mmHg) RA mean 9 RV 60/12 PA 62/22, mean 37 PCWP mean 15 Oxygen saturations: PA 76% AO 96% Cardiac Output (Fick) 10.4  Cardiac Index (Fick) 4.29 PVR 2.1 WU   Objective:   Weight Range: 121.4 kg Body mass index is 37.33 kg/m.   Vital Signs:   Temp:  [97.4 F (36.3 C)-98.3 F (36.8 C)] 97.8 F (36.6 C) (06/09 0733) Pulse Rate:  [68-80] 68 (06/09 0733) Resp:  [11-24] 24 (06/09 0733) BP: (93-127)/(55-77) 114/61 (06/09 0733) SpO2:  [82 %-100 %] 100 % (06/09 0811) FiO2 (%):  [40 %] 40 % (06/09 0343) Weight:  [121.4 kg] 121.4 kg (06/09 0057) Last BM Date: 11/06/18  Weight change: Filed Weights   11/05/18 0407 11/06/18 0243 11/07/18 0057  Weight: 123.6 kg 125.5 kg 121.4 kg    Intake/Output:   Intake/Output Summary (Last 24 hours) at 11/07/2018 0838 Last data filed at 11/06/2018 1138 Gross per 24 hour  Intake 3 ml  Output -  Net 3 ml      Physical Exam    General: NAD Neck: JVP 8-9 cm, no thyromegaly or thyroid nodule.  Lungs: Clear to auscultation bilaterally with normal respiratory effort. CV: Nondisplaced PMI.  Heart regular S1/S2, no S3/S4, no murmur.  No peripheral edema.   Abdomen: Soft, nontender, no hepatosplenomegaly, no distention.  Skin: Intact without lesions or rashes.  Neurologic: Alert and oriented x 3.  Psych: Normal affect. Extremities:  No clubbing or cyanosis.  HEENT: Normal.     Telemetry   NSR 60s, personally reviewed.   Labs    CBC Recent Labs    11/06/18 0409 11/06/18 0758 11/07/18 0500  WBC 10.2  --  10.4  HGB 11.4* 13.6  13.3 10.9*  HCT 36.4 40.0  39.0 35.2*  MCV 84.3  --  84.8  PLT 228  --  026   Basic Metabolic Panel Recent Labs    11/06/18 0409 11/06/18 0758 11/07/18 0500  NA 136 139  138 138  K 3.7 3.4*  3.4* 3.6  CL 82*  --  87*  CO2 40*  --  38*  GLUCOSE 107*  --  122*  BUN 83*  --  91*  CREATININE 2.78*  --  2.65*  CALCIUM 9.7  --  9.5   Liver Function Tests No results for input(s): AST, ALT, ALKPHOS, BILITOT, PROT, ALBUMIN in the last 72 hours. No results for input(s): LIPASE, AMYLASE in the last 72 hours. Cardiac Enzymes No results for input(s): CKTOTAL, CKMB, CKMBINDEX, TROPONINI in the last 72 hours.  BNP: BNP (last 3 results) Recent Labs    07/08/18 2100 10/25/18 0309 10/28/18 1023  BNP 669.1* 709.4* 1,423.5*    ProBNP (last 3 results) No results for input(s): PROBNP in the last 8760 hours.  D-Dimer No results for input(s): DDIMER in the last 72 hours. Hemoglobin A1C No results for input(s): HGBA1C in the last 72 hours. Fasting Lipid Panel No results for input(s): CHOL, HDL, LDLCALC, TRIG, CHOLHDL, LDLDIRECT in the last 72 hours. Thyroid Function Tests No results for input(s): TSH, T4TOTAL, T3FREE, THYROIDAB in the last 72 hours.  Invalid input(s): FREET3  Other results:   Imaging    No results found.   Medications:     Scheduled Medications: . aspirin  81 mg Oral Daily  . carvedilol  3.125 mg Oral BID WC  . dextromethorphan-guaiFENesin  1 tablet Oral BID  . enoxaparin (LOVENOX) injection  60 mg Subcutaneous Q24H  . ferrous gluconate  324 mg Oral Q breakfast  . insulin aspart  3 Units Subcutaneous TID AC  . insulin aspart  3-9 Units Subcutaneous TID AC & HS  . insulin detemir  12 Units Subcutaneous QHS  . ipratropium-albuterol  3 mL  Nebulization TID  . isosorbide-hydrALAZINE  0.5 tablet Per Tube TID  . mouth rinse  15 mL Mouth Rinse BID  . pantoprazole  40 mg Oral Daily  . polyethylene glycol  17 g Per Tube Q M,W,F  . potassium chloride  20 mEq Oral Daily  . Ensure Max Protein  11 oz Oral BID  . rosuvastatin  40 mg Oral q1800  . senna-docusate  1 tablet Oral BID  . sodium chloride flush  3 mL Intravenous Q12H  . spironolactone  25 mg Oral Daily    Infusions: . sodium chloride Stopped (10/31/18 2015)    PRN Medications: sodium chloride, acetaminophen, albuterol, hydrALAZINE, phenol, sodium chloride flush   Assessment/Plan   1. PEA arrest: Due to severe hypoxia on 5/30.  Now extubated, stable.  Chest pain likely due to CPR.  2. Acute on chronic systolic CHF: Suspect ischemic cardiomyopathy.  Echo in 1/20 with EF 40-45%, RV moderately dilated, PASP 71 mmHg.  Echo this admission with EF 30-35%, mildly dilated RV.  Suspect significant RV failure, may be related to OHS/OSA and COPD. She was massively volume overloaded and also likely has [place]. IV Lasix stopped 6/6.  RHC done 6/8 showed high cardiac output and minimally elevated filling pressures. Creatinine lower today but BUN higher.  CVP 12 per nurse today, suspect she will not reach a normal CVP given RV failure, may need to accept 10-12 range.  - Continue torsemide 40 mg daily for now.      - Continue Bidil 1/2 tab tid and spironolactone 25 mg daily.  - Continue Coreg 3.125 mg bid.  3. AKI: Baseline creatinine around 1.3, 2.65 today.  Suspect cardiorenal syndrome and possibly ATN from code. Now off IV Lasix.   4. Acute hypoxic respiratory failure: Post-arrest, extubated 6/1. Now on nasal cannula.  3. COPD: On home oxygen at baseline.  Has quit smoking.  4. OHS/OSA: On home oxygen at all times and Bip[place] at night.  5. CAD: History of PCI, most recently had inferior STEMI in 5/15 with DES to RCA.  She has been having chest pain but chest wall was tender post-CPR, doubt  ischemia.  - Continue ASA 81 and Crestor 40 daily - With fall in EF, will consider coronary angiography eventually if we can stabilize her creatinine.  6. Remote PE: Continue Lovenox for DVT prophylaxis.   7. Anemia: History of GI bleeding, had endoscopies earlier this year with no bleeding source found.  Also with history of B12 deficiency. Transferrin saturation low, got IV Fe.  8. Hypokalemia: Supplement aggressively.  9. Pulmonary hypertension: Looks like high output PH by RHC.  10. Deconditioning: Work with PT, cardiac rehab.   Creatinine stable, think she is near ready for home.  She will need close CHF clinic followup (next week) and will also need repeat BMET Thursday or Friday if she goes home today.  Cardiac meds for home: torsemide 40 mg daily, KCl 20 daily, spironolactone 12.5 daily (would cut dose in half at home with elevated creatinine), ASA 81, Coreg 3.125 mg bid, Bidil 1/2 tab tid, Crestor 40 daily.   Length of Stay: 46  Loralie Champagne, MD  11/07/2018, 8:38 AM  Advanced Heart Failure Team Pager [phone] (M-F; 7a - 4p)  Please contact [facility] for night-coverage after hours (4p -7a ) and weekends on [email or web address]

## 2018-11-07 NOTE — TOC Transition Note (Addendum)
Transition of Care [facility]) - CM/SW Discharge Note   Patient Details  Name: Tamara Silva MRN: [id number] Date of Birth: [birth year]  Transition of Care [facility] CM/SW Contact:  Maryclare Labrador, RN Phone Number: 11/07/2018, 1:05 PM   Clinical Narrative:   Pt to discharge home today.  CM informed by HF team that pt will need BMET drawn by [facility] on Thursday - [facility] contacted and will perform draw as specified on [facility] order.  Pt will discharge home - daughter and significant other will provide 24 hour supervision, pt just received a rolling walker during hospitalization in March. Pt denied need for aide - informed CM that her daughter assists her with ADLs.   CM requested order from triad  Unit secretary to arrange follow up with [facility] with in week      Barriers to Discharge: Continued Medical Work up   Patient Goals and CMS Choice Patient states their goals for this hospitalization and ongoing recovery are:: to return home CMS Medicare.gov Compare Post Acute Care list provided to:: Patient Choice offered to / list presented to : Patient  Discharge Placement                       Discharge Plan and Services   Discharge Planning Services: CM Consult Post Acute Care Choice: Home Health                    HH Arranged: PT, OT [place] Agency: [facility]) Date [place]: 11/07/18 Time [place]: [place] Representative spoke with at [place]: [place] informed that [place] added to order  Social Determinants of Health (SDOH) Interventions     Readmission Risk Interventions Readmission Risk Prevention Plan 11/02/2018 01/17/2018  Transportation Screening Complete Complete  Medication Review Press photographer) Complete -  PCP or Specialist appointment within 3-5 days of discharge Complete -  [place] or [place] Complete Complete  SW Recovery Care/Counseling Consult Complete -  [place] Patient Refused -  Some recent data might be hidden

## 2018-11-07 NOTE — Progress Notes (Signed)
Discussed HF booklet and management with pt. Good reception. She had read booklet overnight. She is planning to stay with daughter who will help her with meds and diet. She is motivated for change. Not tempted to smoke again. Pt is very thankful for her life. Gave low sodium and DM diets to go along with HF booklet. Pt declined watching HF video. She is afraid it will make her depressed. [id number] [place], ACSM 10:40 AM 11/07/2018

## 2018-11-07 NOTE — Progress Notes (Signed)
Spoke with Dr. Avon Gully about patient being discharged today and if we can remove her UNA boots, MD verbalized permission to do so. UNA boots removed by RN, patient tolerated well.

## 2018-11-07 NOTE — Progress Notes (Signed)
Physical Therapy Treatment Patient Details Name: Tamara Silva MRN: [id number] DOB: September 28, 1960 Today's Date: 11/07/2018    History of Present Illness Pt is a 58 yo female s/p SOB, edema with a negative CT of chest , 4L O2 dependent. PMHx: recent CVA with seizure requiring intubation, EF of 40-50%, DMT2, chronic kidney dx, CAD, obesity, presents to the ER because of increasing shortness of breath over the last few days with increasing peripheral edema. Pt with seizure on 5/30, PEA arrest while in CT requring extensive CPR and intubated from 5/30-6/1.     PT Comments    Patient seen for mobility progression. Making good progress towards goals. Performing functional mobility at general min guard level with cueing for safety. SpO2 >90% on 3L with mobility - only subjective complaints of general fatigue.  Patient requiring increased time and cueing for sequencing and processing. Updated d/c recs to HHPT as patient is progressing well with mobility. Will continue to follow.     Follow Up Recommendations  Home health PT;Supervision/Assistance - 24 hour(must have 24/hr supervision; likely will need aide)     Equipment Recommendations  Rolling walker with 5" wheels    Recommendations for Other Services       Precautions / Restrictions Precautions Precautions: Fall Precaution Comments: watch 02 Restrictions Weight Bearing Restrictions: No    Mobility  Bed Mobility               General bed mobility comments: EOB upon entry  Transfers Overall transfer level: Needs assistance Equipment used: Rolling walker (2 wheeled) Transfers: Sit to/from Stand Sit to Stand: Min guard;Supervision         General transfer comment: cueing for hand placement and safety, min guard fading to supervision   Ambulation/Gait Ambulation/Gait assistance: Min guard;Supervision Gait Distance (Feet): (3 trials: 110, 80, 30) Assistive device: Rolling walker (2 wheeled) Gait Pattern/deviations:  Step-through pattern;Decreased stride length;Trunk flexed Gait velocity: slower   General Gait Details: slow steady pace of gait; 3L O2 with SpO2 >95% with mobility   Stairs             Wheelchair Mobility    Modified Rankin (Stroke Patients Only)       Balance Overall balance assessment: Needs assistance Sitting-balance support: No upper extremity supported;Feet supported Sitting balance-Leahy Scale: Good     Standing balance support: Bilateral upper extremity supported;During functional activity Standing balance-Leahy Scale: Fair Standing balance comment: reliant on BUE support                            Cognition Arousal/Alertness: Awake/alert Behavior During Therapy: WFL for tasks assessed/performed Overall Cognitive Status: Impaired/Different from baseline Area of Impairment: Attention                   Current Attention Level: Sustained           General Comments: functional, but requires mod cueing to attend to and sequence oral care when fatgiued after mobility       Exercises      General Comments General comments (skin integrity, edema, etc.): on 3L supplemental oxgyen throughout session with saturations >92%      Pertinent Vitals/Pain Pain Assessment: Faces Faces Pain Scale: Hurts little more Pain Location: chest from compressions Pain Descriptors / Indicators: Sore Pain Intervention(s): Limited activity within patient's tolerance;Monitored during session    Home Living  Prior Function            PT Goals (current goals can now be found in the care plan section) Acute Rehab PT Goals Patient Stated Goal: to go home PT Goal Formulation: With patient Time For Goal Achievement: 11/09/18 Potential to Achieve Goals: Good Progress towards PT goals: Progressing toward goals    Frequency    Min 3X/week      PT Plan Discharge plan needs to be updated    Co-evaluation               AM-PAC PT "6 Clicks" Mobility   Outcome Measure  Help needed turning from your back to your side while in a flat bed without using bedrails?: A Little Help needed moving from lying on your back to sitting on the side of a flat bed without using bedrails?: A Little Help needed moving to and from a bed to a chair (including a wheelchair)?: A Little Help needed standing up from a chair using your arms (e.g., wheelchair or bedside chair)?: A Little Help needed to walk in hospital room?: A Little Help needed climbing 3-5 steps with a railing? : A Lot 6 Click Score: 17    End of Session Equipment Utilized During Treatment: Gait belt;Oxygen Activity Tolerance: Patient tolerated treatment well Patient left: in chair;with call bell/phone within reach Nurse Communication: Mobility status PT Visit Diagnosis: Other abnormalities of gait and mobility (R26.89);Muscle weakness (generalized) (M62.81);Difficulty in walking, not elsewhere classified (R26.2)     Time: 6606-3016 PT Time Calculation (min) (ACUTE ONLY): 44 min  Charges:  $Gait Training: 8-22 mins $Therapeutic Activity: 8-22 mins                     Lanney Gins, PT, DPT Supplemental Physical Therapist 11/07/18 10:05 AM Pager: [phone] Office: [phone]

## 2018-11-07 NOTE — Discharge Summary (Signed)
Physician Discharge Summary  Tamara Silva QPY:[id number] DOB: Jan 22, 1961 DOA: 10/25/2018  PCP: Antony Blackbird, MD  Admit date: 10/25/2018 Discharge date: 11/07/2018  Admitted From: Home  Disposition: Home with home health   Recommendations for Outpatient Follow-up:  1. Follow up with PCP in 1-2 weeks 2. Please obtain BMP/CBC within 48 hours on 11/09/2018 with home health -with PCP for results   Home Health: Yes Equipment/Devices: Oxygen - 2L at rest, 4L with exertion and at night with BiPAP   Discharge Condition: Stable  CODE STATUS: Full Diet recommendation: Heart healthy, low carb, low salt, fluid restriction 1500 cc  Brief/Interim Summary: 58 year old with a history of systolic CHF ejection fraction 40-45%-January 2020, obesity hypoventilation on BiPAP at bedtime, diabetes mellitus type 2, CKD, CAD, recent CVA requiring intubation for seizure presented for increasing shortness of breath and peripheral edema. Chest x-ray overall unremarkable. BNP slightly elevated, creatinine elevated from baseline of 1.3 to1.8. Started on IV diuretics. Also some component of COPD exacerbation. Cardiology consulted give her adv cardiac issues. Due to signficant amount of overload, PICC was placed to monitor CVP and CoOx, started on Lasix drip. On 5/30 morning patient suddenly became unresponsive and concerns for seizure which lasted for more than 20 minutes. She was given 2 mg of IV Ativan and CT of the head was ordered. While she was getting CT of the head, patient became hypoxic leading to PEA cardiac arrest. 1 round of CPR was performed with regain of pulse but soon after went back again cardiac arrest requiring another round of CPR. She was transferred to ICU after being intubated. Neurology and critical care team were consulted. Patient extubated successfully 10/30/18. Transferred back to medicine/cardiology teams for ongoing management and diuresis.  Patient admitted as above with acute hypoxic  respiratory failure on chronic respiratory failure in the setting of heart failure exacerbation.  Questionably in the setting of medication noncompliance, patient admitted as above with worsening respiratory status, had episode of PEA arrest requiring 1 round of CPR intubated and transferred to the ICU.  Patient was subsequently weaned off of the ventilator, proved quite drastically over the past 48 hours.  Heart failure team following, appreciate insight and recommendations, had right heart cath showing as below elevated pressures with CVP remaining around 12 which, while elevated, may be patient's baseline.  We continue to wean patient's oxygen, she at 1 point was on room air at rest.hypoxia however really requires 2 L nasal cannula rest, 4 L nasal cannula with exertion and at night with BiPAP.  Her previous oxygen settings were 4 L around-the-clock with BiPAP at night.  This time patient's volume status drastically improved patient is nearly 23 L negative since admission.  Since drastic diuresis, findings on heart cath and otherwise improving function at this point patient is stable and agreeable for discharge with home health.  Will need BMP in 48 hours on 11/09/2018 with home health through [place], her PCP will follow along Dr. Chapman Fitch results.  She has been initiated on core measures for combined heart failure including torsemide, BiDil, carvedilol, spironolactone per heart team -we stressed at bedside daily patient's need for daily weights, strict I's and O's and salt restriction.  She otherwise stable and agreeable for discharge home with home health and physical therapy as above.  Discharge Diagnoses:  Principal Problem:   Acute on chronic diastolic CHF (congestive heart failure) (HCC) Active Problems:   Morbid obesity ([place])   CAD S/P percutaneous coronary angioplasty - prior PCI to LAD; RCA PCI:  new Xience Alpine DES 2.75 mm x 15 mm    COPD exacerbation (HCC)   CKD (chronic kidney disease), stage  II   Acute respiratory failure with hypoxia ([place])   Uncontrolled type 2 diabetes mellitus without complication, with long-term current use of insulin (HCC)   OSA (obstructive sleep apnea)   Tobacco abuse disorder   Hypertension   Iron deficiency anemia   Diabetes mellitus type 2 in obese (HCC)   Slow transit constipation   Cardiac arrest ([place])   Seizure ([place])    Discharge Instructions   Allergies as of 11/07/2018      Reactions   Metolazone Other (See Comments)   Dizziness and falling   Plavix [clopidogrel Bisulfate] Other (See Comments)   High PRU's - non-responder   Ibuprofen Other (See Comments)   Pt states she is not supposed to take ibuprofen because of other meds she is taking   Nsaids Other (See Comments)   "I do not take NSAIDs d/t interfering with other meds"      Medication List    STOP taking these medications   aspirin 81 MG tablet Replaced by:  aspirin 81 MG chewable tablet   budesonide-formoterol 80-4.5 MCG/ACT inhaler Commonly known as:  Symbicort   furosemide 40 MG tablet Commonly known as:  LASIX   losartan 50 MG tablet Commonly known as:  COZAAR   nitroGLYCERIN 0.4 MG SL tablet Commonly known as:  NITROSTAT   Potassium Chloride ER 20 MEQ Tbcr     TAKE these medications   Accu-Chek Aviva Plus test strip Generic drug:  glucose blood USE 1 STRIP TO CHECK GLUCOSE 3 TIMES DAILY   albuterol 108 (90 Base) MCG/ACT inhaler Commonly known as:  ProAir HFA INHALE 2 PUFFS BY MOUTH 4 TIMES DAILY AS NEEDED What changed:    how much to take  how to take this  when to take this  reasons to take this  additional instructions   Anoro Ellipta 62.5-25 MCG/INH Aepb Generic drug:  umeclidinium-vilanterol Inhale 1 puff into the lungs daily.   aspirin 81 MG chewable tablet Chew 1 tablet (81 mg total) by mouth daily for 30 days. Replaces:  aspirin 81 MG tablet   carvedilol 3.125 MG tablet Commonly known as:  COREG Take 1 tablet (3.125 mg total) by  mouth 2 (two) times daily with a meal.   Ensure Max Protein Liqd Take 330 mLs (11 oz total) by mouth 2 (two) times daily for 30 days.   ferrous gluconate 324 MG tablet Commonly known as:  FERGON Take 1 tablet (324 mg total) by mouth daily with breakfast. Office visit needed prior to future refills   hydrocerin Crea Apply 1 application topically 2 (two) times daily.   insulin detemir 100 UNIT/ML injection Commonly known as:  LEVEMIR Inject 0.12 mLs (12 Units total) into the skin at bedtime. What changed:    how much to take  when to take this   ipratropium-albuterol 0.5-2.5 (3) MG/3ML Soln Commonly known as:  DUONEB Use nebulizer to inhale contents of one vial every 6 hours as needed for shortness of breath, wheezing   isosorbide-hydrALAZINE 20-37.5 MG tablet Commonly known as:  BIDIL Place 0.5 tablets into feeding tube 3 (three) times daily for 30 days.   NEEDLE (DISP) 26 G 26G X 1/2" Misc 1 application by Does not apply route daily.   omeprazole 40 MG capsule Commonly known as:  PRILOSEC Take 1 capsule (40 mg total) by mouth daily.   polyethylene glycol  17 g packet Commonly known as:  MiraLax Take 17 g by mouth every Monday, Wednesday, and Friday.   potassium chloride SA 20 MEQ tablet Commonly known as:  K-DUR Take 1 tablet (20 mEq total) by mouth daily for 30 days.   rosuvastatin 40 MG tablet Commonly known as:  CRESTOR Take 1 tablet (40 mg total) by mouth daily at 6 PM.   senna-docusate 8.6-50 MG tablet Commonly known as:  Senokot-S Take 1 tablet by mouth 2 (two) times daily. If needed for constipation   spironolactone 25 MG tablet Commonly known as:  ALDACTONE Take 1 tablet (25 mg total) by mouth daily for 30 days.   torsemide 20 MG tablet Commonly known as:  DEMADEX Take 2 tablets (40 mg total) by mouth daily for 30 days.      Follow-up Information    [facility] Follow up.   Specialty:  Home Health Services Why:  For home health RN they will  be in contact with you in 1-2 days to schedule your first home visit.  Contact information: [address] [place] [address] [phone]        [facility] Follow up on 11/21/2018.   Specialty:  Cardiology Why:  Dr Aundra Dubin  at 11:00 Garage Code [id number]  Contact information: [address] [id number] [place] [phone]         Allergies  Allergen Reactions  . Metolazone Other (See Comments)    Dizziness and falling  . Plavix [Clopidogrel Bisulfate] Other (See Comments)    High PRU's - non-responder  . Ibuprofen Other (See Comments)    Pt states she is not supposed to take ibuprofen because of other meds she is taking  . Nsaids Other (See Comments)    "I do not take NSAIDs d/t interfering with other meds"    Consultations:  Heart failure team   Procedures/Studies: Dg Abd 1 View  Result Date: 10/28/2018 CLINICAL DATA:  OG tube placement EXAM: ABDOMEN - 1 VIEW COMPARISON:  06/12/2018 FINDINGS: Enteric tube terminates in the proximal gastric body. IMPRESSION: Enteric tube terminates in the proximal gastric body. Electronically Signed   By: Julian Hy M.D.   On: 10/28/2018 10:39   Ct Head Wo Contrast  Result Date: 10/28/2018 CLINICAL DATA:  Post code.  Altered mental status. EXAM: CT HEAD WITHOUT CONTRAST TECHNIQUE: Contiguous axial images were obtained from the base of the skull through the vertex without intravenous contrast. COMPARISON:  06/05/2018 FINDINGS: Brain: Low-density areas in the right parietal and posterior temporal lobes compatible with old infarcts. Old right caudate head lacunar infarct, stable. No acute intracranial abnormality. Specifically, no hemorrhage, hydrocephalus, mass lesion, acute infarction, or significant intracranial injury. Vascular: No hyperdense vessel or unexpected calcification. Skull: No acute calvarial abnormality. Sinuses/Orbits: Visualized  paranasal sinuses and mastoids clear. Orbital soft tissues unremarkable. 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 Old right basal ganglia lacunar infarct. No acute intracranial abnormality. Electronically Signed   By: Rolm Baptise M.D.   On: 10/28/2018 21:53   Dg Chest Port 1 View  Result Date: 10/31/2018 CLINICAL DATA:  Pulmonary edema and asthma EXAM: PORTABLE CHEST 1 VIEW COMPARISON:  Two days ago FINDINGS: Interval tracheal and esophageal extubation. There is a scar-like appearance in the peripheral left upper lobe. Stable cardiomegaly. Stable vascular congestion/edema. Right upper extremity PICC with tip at the SVC. IMPRESSION: 1. Cardiomegaly and persistent vascular congestion/edema. 2. Stable inflation  after extubation. Electronically Signed   By: Monte Fantasia M.D.   On: 10/31/2018 07:29   Dg Chest Port 1 View  Result Date: 10/29/2018 CLINICAL DATA:  Cardiac arrest EXAM: PORTABLE CHEST 1 VIEW COMPARISON:  10/28/2018 FINDINGS: Mild cardiomegaly. Endotracheal tube, NG tube, right PICC are stable. Patchy and hazy airspace opacities throughout both lungs are worse. Low lung volumes. No pneumothorax. IMPRESSION: Worsening bilateral airspace disease. Stable support apparatus. Electronically Signed   By: Marybelle Killings M.D.   On: 10/29/2018 08:57   Dg Chest Port 1 View  Result Date: 10/28/2018 CLINICAL DATA:  ETT placement EXAM: PORTABLE CHEST 1 VIEW COMPARISON:  10/25/2018 FINDINGS: Endotracheal tube terminates 4.5 cm above the carina. Multifocal patchy opacities, upper lobe predominant. While this patient has chronic scarring, superimposed right upper lobe pneumonia is not excluded. No definite pleural effusion. Cardiomegaly. Right arm PICC terminates in the lower SVC. IMPRESSION: Endotracheal tube terminates 4.5 cm above the carina. Right arm PICC terminates in the lower SVC. Multifocal patchy opacities, upper lobe predominant, some of which may reflect chronic  scarring. Superimposed right upper lobe pneumonia is not excluded. Electronically Signed   By: Julian Hy M.D.   On: 10/28/2018 10:38   Dg Chest Port 1 View  Result Date: 10/25/2018 CLINICAL DATA:  Shortness of breath EXAM: PORTABLE CHEST 1 VIEW COMPARISON:  07/08/2018 FINDINGS: Cardiac shadow remains enlarged. Lungs are well aerated bilaterally. Chronic scarring in the left lateral lung is noted. No acute infiltrate or sizable effusion is seen. IMPRESSION: Chronic changes without acute abnormality. Electronically Signed   By: Inez Catalina M.D.   On: 10/25/2018 02:13   [place] Ekg Site Rite  Result Date: 10/27/2018 If Site Rite image not attached, placement could not be confirmed due to current cardiac rhythm.    Subjective: No acute issues or events overnight, patient tolerating PT well, continues to complain of some musculoskeletal chest pain in the setting of recent CPR but otherwise denies shortness of breath, nausea, vomiting, diarrhea, constipation, headache, fevers, chills.   Discharge Exam: Vitals:   11/07/18 0733 11/07/18 0811  BP: 114/61   Pulse: 68   Resp: (!) 24   Temp: 97.8 F (36.6 C)   SpO2: 96% 100%   Vitals:   11/07/18 0343 11/07/18 0400 11/07/18 0733 11/07/18 0811  BP:  93/77 114/61   Pulse: 71 73 68   Resp: 20 20 (!) 24   Temp:  98.1 F (36.7 C) 97.8 F (36.6 C)   TempSrc:  Axillary Oral   SpO2: 99% 98% 96% 100%  Weight:      Height:        General:  Pleasantly resting in bed, No acute distress. HEENT:  Normocephalic atraumatic. Sclerae nonicteric, noninjected. Extraocular movements intact bilaterally. Neck: Without mass or deformity. Trachea is midline. Lungs: Clear to auscultate bilaterally without rhonchi, wheeze, or rales. Moderate tenderness to palpation over chest. Heart: Regular rate and rhythm. Without murmurs, rubs, or gallops. Abdomen: Soft, nontender, nondistended. Without guarding or rebound. Extremities: Without cyanosis, clubbing,  obvious deformity. Vascular: Dorsalis pedis and posterior tibial pulses palpable bilaterally. Skin: Warm and dry, no erythema, no ulcerations.  PICC line right upper extremity.   The results of significant diagnostics from this hospitalization (including imaging, microbiology, ancillary and laboratory) are listed below for reference.     Microbiology: Recent Results (from the past 240 hour(s))  MRSA PCR Screening     Status: None   Collection Time: 10/28/18  1:22 PM  Result Value Ref Range  Status   MRSA by PCR NEGATIVE NEGATIVE Final    Comment:        The GeneXpert MRSA Assay (FDA approved for NASAL specimens only), is one component of a comprehensive MRSA colonization surveillance program. It is not intended to diagnose MRSA infection nor to guide or monitor treatment for MRSA infections. Performed at [facility], [place] [address], [place], [place] [address]      Labs: BNP (last 3 results) Recent Labs    07/08/18 2100 10/25/18 0309 10/28/18 1023  BNP 669.1* 709.4* 1,423.5*   Basic Metabolic Panel: Recent Labs  Lab 11/03/18 0355 11/04/18 0350 11/05/18 0438 11/06/18 0409 11/06/18 0758 11/07/18 0500  NA 136 137 138 136 139  138 138  K 3.3* 4.1 3.3* 3.7 3.4*  3.4* 3.6  CL 74* 77* 81* 82*  --  87*  CO2 46* 45* 41* 40*  --  38*  GLUCOSE 230* 120* 101* 107*  --  122*  BUN 67* 68* 77* 83*  --  91*  CREATININE 2.30* 2.61* 2.67* 2.78*  --  2.65*  CALCIUM 9.7 10.4* 10.0 9.7  --  9.5   Liver Function Tests: Recent Labs  Lab 11/01/18 0320  AST 27  ALT 16  ALKPHOS 111  BILITOT 1.6*  PROT 9.0*  ALBUMIN 2.9*   No results for input(s): LIPASE, AMYLASE in the last 168 hours. No results for input(s): AMMONIA in the last 168 hours. CBC: Recent Labs  Lab 11/03/18 0355 11/04/18 0350 11/05/18 0438 11/06/18 0409 11/06/18 0758 11/07/18 0500  WBC 7.2 9.6 10.0 10.2  --  10.4  HGB 9.7* 10.7* 11.7* 11.4* 13.6  13.3 10.9*  HCT 31.6* 35.0* 39.1 36.4 40.0   39.0 35.2*  MCV 85.2 84.7 86.5 84.3  --  84.8  PLT 198 222 234 228  --  222   Cardiac Enzymes: No results for input(s): CKTOTAL, CKMB, CKMBINDEX, TROPONINI in the last 168 hours. BNP: Invalid input(s): POCBNP CBG: Recent Labs  Lab 11/06/18 0612 11/06/18 1102 11/06/18 1558 11/06/18 2111 11/07/18 0639  GLUCAP 107* 121* 144* 148* 124*   D-Dimer No results for input(s): DDIMER in the last 72 hours. Hgb A1c No results for input(s): HGBA1C in the last 72 hours. Lipid Profile No results for input(s): CHOL, HDL, LDLCALC, TRIG, CHOLHDL, LDLDIRECT in the last 72 hours. Thyroid function studies No results for input(s): TSH, T4TOTAL, T3FREE, THYROIDAB in the last 72 hours.  Invalid input(s): FREET3 Anemia work up No results for input(s): VITAMINB12, FOLATE, FERRITIN, TIBC, IRON, RETICCTPCT in the last 72 hours. Urinalysis    Component Value Date/Time   COLORURINE YELLOW 10/28/2018 1003   APPEARANCEUR HAZY (A) 10/28/2018 1003   LABSPEC 1.008 10/28/2018 1003   PHURINE 5.0 10/28/2018 1003   GLUCOSEU NEGATIVE 10/28/2018 1003   HGBUR NEGATIVE 10/28/2018 1003   BILIRUBINUR NEGATIVE 10/28/2018 1003   KETONESUR NEGATIVE 10/28/2018 1003   PROTEINUR NEGATIVE 10/28/2018 1003   UROBILINOGEN 2.0 (H) 02/21/2015 1914   NITRITE NEGATIVE 10/28/2018 1003   LEUKOCYTESUR NEGATIVE 10/28/2018 1003   Sepsis Labs Invalid input(s): PROCALCITONIN,  WBC,  LACTICIDVEN Microbiology Recent Results (from the past 240 hour(s))  MRSA PCR Screening     Status: None   Collection Time: 10/28/18  1:22 PM  Result Value Ref Range Status   MRSA by PCR NEGATIVE NEGATIVE Final    Comment:        The GeneXpert MRSA Assay (FDA approved for NASAL specimens only), is one component of a comprehensive MRSA  colonization surveillance program. It is not intended to diagnose MRSA infection nor to guide or monitor treatment for MRSA infections. Performed at [facility], [place] [address], [place], [place]  [address]      Time coordinating discharge: Over 40 minutes  SIGNED:  Little Ishikawa, DO [facility] 11/07/2018, 10:05 AM  If 7PM-7AM, please contact night-coverage [email or web address] Password [email or web address]

## 2018-11-07 NOTE — Progress Notes (Signed)
Patient given discharged instructions both verbally and in written format. Patient voiced understanding.

## 2018-11-07 NOTE — Progress Notes (Signed)
Occupational Therapy Treatment Patient Details Name: Tamara Silva MRN: [id number] DOB: 1960/12/03 Today's Date: 11/07/2018    History of present illness Pt is a 58 yo female s/p SOB, edema with a negative CT of chest , 4L O2 dependent. PMHx: recent CVA with seizure requiring intubation, EF of 40-50%, DMT2, chronic kidney dx, CAD, obesity, presents to the ER because of increasing shortness of breath over the last few days with increasing peripheral edema. Pt with seizure on 5/30, PEA arrest while in CT requring extensive CPR and intubated from 5/30-6/1.    OT comments  Patient progressing well.  She is able to complete basic transfers with min guard fading to close supervision, given cueing for hand placement and safety. LB dressing with min guard for safety, cueing figure 4 technique (after cued) to don socks with supervision seated. She requires increased time to process and sequence tasks, especially when fatigued requiring mod cueing to attend to oral care task seated at sink at completion of session. If patient has 24/7 care at dc, updated dc plan to [facility] and aide support.  Will follow.    Follow Up Recommendations  Supervision/Assistance - 24 hour;Home health OT;Other (comment)(HH aide )    Equipment Recommendations  None recommended by OT    Recommendations for Other Services      Precautions / Restrictions Precautions Precautions: Fall Precaution Comments: watch 02 Restrictions Weight Bearing Restrictions: No       Mobility Bed Mobility               General bed mobility comments: EOB upon entry  Transfers Overall transfer level: Needs assistance Equipment used: Rolling walker (2 wheeled) Transfers: Sit to/from Stand Sit to Stand: Min guard;Supervision         General transfer comment: cueing for hand placement and safety, min guard fading to supervision     Balance Overall balance assessment: Needs assistance Sitting-balance support: No upper extremity  supported;Feet supported Sitting balance-Leahy Scale: Good     Standing balance support: Bilateral upper extremity supported;During functional activity Standing balance-Leahy Scale: Fair Standing balance comment: reliant on BUE support                           ADL either performed or assessed with clinical judgement   ADL Overall ADL's : Needs assistance/impaired     Grooming: Oral care;Sitting;Cueing for sequencing Grooming Details (indicate cue type and reason): mod cueing to attend to task              Lower Body Dressing: Min guard;Sit to/from stand Lower Body Dressing Details (indicate cue type and reason): figure 4 technqiue to don socks with supervision, sit to stand min guard  Toilet Transfer: Min guard;Ambulation;RW Toilet Transfer Details (indicate cue type and reason): simulated to recliner          Functional mobility during ADLs: Min guard;Supervision/safety;Rolling walker General ADL Comments: pt remains with decreased act tolerance      Vision       Perception     Praxis      Cognition Arousal/Alertness: Awake/alert Behavior During Therapy: WFL for tasks assessed/performed Overall Cognitive Status: Impaired/Different from baseline Area of Impairment: Attention                   Current Attention Level: Sustained           General Comments: functional, but requires mod cueing to attend to and sequence oral care when fatgiued  after mobility         Exercises     Shoulder Instructions       General Comments on 3L supplemental oxgyen throughout session with saturations >92%    Pertinent Vitals/ Pain       Pain Assessment: Faces Faces Pain Scale: Hurts little more Pain Location: chest from compressions Pain Descriptors / Indicators: Sore Pain Intervention(s): Monitored during session;Repositioned  Home Living                                          Prior Functioning/Environment               Frequency  Min 3X/week        Progress Toward Goals  OT Goals(current goals can now be found in the care plan section)  Progress towards OT goals: Progressing toward goals  Acute Rehab OT Goals Patient Stated Goal: to go home OT Goal Formulation: With patient  Plan Discharge plan needs to be updated;Frequency remains appropriate    Co-evaluation                 AM-PAC OT "6 Clicks" Daily Activity     Outcome Measure   Help from another person eating meals?: None Help from another person taking care of personal grooming?: A Little Help from another person toileting, which includes using toliet, bedpan, or urinal?: A Little Help from another person bathing (including washing, rinsing, drying)?: A Little Help from another person to put on and taking off regular upper body clothing?: None Help from another person to put on and taking off regular lower body clothing?: A Little 6 Click Score: 20    End of Session Equipment Utilized During Treatment: Gait belt;Rolling walker;Oxygen  OT Visit Diagnosis: Unsteadiness on feet (R26.81);Muscle weakness (generalized) (M62.81)   Activity Tolerance Patient tolerated treatment well   Patient Left in chair;with call bell/phone within reach   Nurse Communication Mobility status        Time: 9233-0076 OT Time Calculation (min): 44 min  Charges: OT General Charges $OT Visit: 1 Visit OT Treatments $Self Care/Home Management : 8-22 mins  Delight Stare, [place] Pager [phone] Office [phone]   Delight Stare 11/07/2018, 9:32 AM

## 2018-11-08 ENCOUNTER — Telehealth: Payer: Self-pay

## 2018-11-08 NOTE — Telephone Encounter (Signed)
Transition Care Management Follow-up Telephone Call Date of discharge and from where: 11/07/2018, [facility].  Call placed to # [phone], message left requesting a call back to this CM # [phone]

## 2018-11-09 ENCOUNTER — Telehealth: Payer: Self-pay

## 2018-11-09 ENCOUNTER — Telehealth: Payer: Self-pay | Admitting: Family Medicine

## 2018-11-09 NOTE — Telephone Encounter (Signed)
Did you forward to Dr. Wynetta Emery as well?

## 2018-11-09 NOTE — Telephone Encounter (Signed)
Transition Care Management Follow-up Telephone Call  Date of discharge and from where: 11/07/2018, [facility]   How have you been since you were released from the hospital? She said that she is  " doing a lot better" and celebrated her birthday yesterday. She also spoke about the need to make a life change in order to stay out of the hospital.   Any questions or concerns? None reported   This CM called [facility], spoke to [place] and requested that the nurse that sees the patient today reconcile medications as patient had a number of changes when she was discharged from the hospital.  Also asked that the nurse remind the patient of her appointment with Dr Wynetta Emery on 07/16/2018 @ 0930 and that the patient will be contacted the day prior to the visit to confirm if the appointment is televisit or in person. Margreta Journey stated that the nurse would be notified   Items Reviewed:  Did the pt receive and understand the discharge instructions provided? She said that she has the instructions and did not have any questions  Medications obtained and verified? She said that she has all medications, including new medications and is taking them  as ordered. She said that she understands that there are a number of changes - new medications and some to be discontinued.  She did not want to review the list with this CM but was agreeable to reviewing the list with the home health nurse when she comes to the house today.   Any new allergies since your discharge? None reported   Dietary orders reviewed? Reviewed 1.5 L/day   Fluid restriction  Do you have support at home? She is living with her daughter, her grandchildren and her boyfriend  Other (ie: DME, Home Health, etc) [facility] will provide nursing and therapy. The nurse is scheduled to see the patient today to draw bloodwork.    She has a glucometer - blood sugar today 163 - instructed her to keep a log of results.   She also has a  nebulizer, BiPAP and O2.  She said that she uses the O2 @ 3L continuously and will increase to 4L with exertion.  She said that she has a scale. Needs to weigh herself daily and log weights. Reviewed when to weigh herself and when to report weight gain to MD  She does not have PCS or a home BP monitor,   Functional Questionnaire: (I = Independent and D = Dependent) ADL's:Family provides needed assistance and she has a RW to use as needed.    Follow up appointments reviewed:    PCP Hospital f/u appt confirmed? 11/14/2018 with Dr Wynetta Emery.  Informed her that the appointment can be changed to Dr Chapman Fitch on a different day but she said she wanted to keep this appointment.  Explained that she would be contacted the day prior to the appointment to confirm if it is a televisit or in person visit.   [facility] f/u appt confirmed? Cardiology - 11/20/2018 @ 1100  Are transportation arrangements needed? No problems with transportation reported  If their condition worsens, is the pt aware to call  their PCP or go to the ED? yes  Was the patient provided with contact information for the PCP's office or ED? She has the clinic phone number  Was the pt encouraged to call back with questions or concerns? yes

## 2018-11-09 NOTE — Telephone Encounter (Signed)
New Message    Cara-Home health nurse is calling to get orders for 2 week 3 and 1 week 6 and an order for another lab because they could not get it today. Please f/u

## 2018-11-09 NOTE — Telephone Encounter (Signed)
Thanks, and if Dr. Wynetta Emery would like to keep her....[name] KitchenMarland Kitchen

## 2018-11-10 NOTE — Telephone Encounter (Signed)
You can contact home health nurse and approve order change but also have her fax order as I am not clear on what  She is asking

## 2018-11-13 NOTE — Telephone Encounter (Signed)
Cara from [facility] contacted and given approval for order change and will fax order over to clinic today.

## 2018-11-14 ENCOUNTER — Inpatient Hospital Stay: Payer: Medicaid Other | Admitting: Internal Medicine

## 2018-11-14 ENCOUNTER — Telehealth: Payer: Self-pay | Admitting: Family Medicine

## 2018-11-14 NOTE — Telephone Encounter (Signed)
Okay to give verbal order as per message

## 2018-11-14 NOTE — Telephone Encounter (Signed)
aron called to get verbal order for home health to start today versus yesterday. Please follow up.

## 2018-11-14 NOTE — Telephone Encounter (Signed)
[facility] reached and gave a verbal ok to orders for patients home health visits.

## 2018-11-15 ENCOUNTER — Telehealth: Payer: Self-pay | Admitting: Family Medicine

## 2018-11-15 NOTE — Telephone Encounter (Signed)
New Message   [facility] is calling to request orders for occupational theraphy 1 week 1, 2 week 4 and 1 week 3

## 2018-11-15 NOTE — Telephone Encounter (Signed)
Please notify home health that patient failed to keep her office appointment yesterday in follow-up of her recent hospital discharge. She can have home health verbal order for 2 weeks of therapy but needs to reschedule her office follow-up

## 2018-11-16 ENCOUNTER — Telehealth: Payer: Self-pay | Admitting: Family Medicine

## 2018-11-16 NOTE — Telephone Encounter (Signed)
[facility] called to check on verbal orders. Please follow up.

## 2018-11-16 NOTE — Telephone Encounter (Signed)
Patients call taken.  Patient identified by name and date of birth.  Patient gave Nursing the name of the [facility] Midwife.  This Nurse authorized verbal order.  [facility] from [facility] acknowledged understanding of advise.

## 2018-11-17 ENCOUNTER — Encounter (HOSPITAL_COMMUNITY): Payer: Self-pay

## 2018-11-17 ENCOUNTER — Emergency Department (HOSPITAL_COMMUNITY): Payer: Medicaid Other

## 2018-11-17 ENCOUNTER — Inpatient Hospital Stay (HOSPITAL_COMMUNITY)
Admission: EM | Admit: 2018-11-17 | Discharge: 2018-11-20 | DRG: 291 | Disposition: A | Discharge status: Home Health | Payer: Medicaid Other | Attending: Student | Admitting: Student

## 2018-11-17 ENCOUNTER — Other Ambulatory Visit: Payer: Self-pay

## 2018-11-17 ENCOUNTER — Telehealth: Payer: Self-pay | Admitting: Family Medicine

## 2018-11-17 DIAGNOSIS — Z888 Allergy status to other drugs, medicaments and biological substances status: Secondary | ICD-10-CM

## 2018-11-17 DIAGNOSIS — Z955 Presence of coronary angioplasty implant and graft: Secondary | ICD-10-CM

## 2018-11-17 DIAGNOSIS — G40909 Epilepsy, unspecified, not intractable, without status epilepticus: Secondary | ICD-10-CM | POA: Diagnosis present

## 2018-11-17 DIAGNOSIS — Z794 Long term (current) use of insulin: Secondary | ICD-10-CM | POA: Diagnosis not present

## 2018-11-17 DIAGNOSIS — I2782 Chronic pulmonary embolism: Secondary | ICD-10-CM | POA: Diagnosis present

## 2018-11-17 DIAGNOSIS — K219 Gastro-esophageal reflux disease without esophagitis: Secondary | ICD-10-CM | POA: Diagnosis present

## 2018-11-17 DIAGNOSIS — Z8673 Personal history of transient ischemic attack (TIA), and cerebral infarction without residual deficits: Secondary | ICD-10-CM | POA: Diagnosis not present

## 2018-11-17 DIAGNOSIS — Z825 Family history of asthma and other chronic lower respiratory diseases: Secondary | ICD-10-CM

## 2018-11-17 DIAGNOSIS — Z833 Family history of diabetes mellitus: Secondary | ICD-10-CM

## 2018-11-17 DIAGNOSIS — N183 Chronic kidney disease, stage 3 (moderate): Secondary | ICD-10-CM | POA: Diagnosis present

## 2018-11-17 DIAGNOSIS — Z79899 Other long term (current) drug therapy: Secondary | ICD-10-CM

## 2018-11-17 DIAGNOSIS — Z9981 Dependence on supplemental oxygen: Secondary | ICD-10-CM | POA: Diagnosis not present

## 2018-11-17 DIAGNOSIS — I130 Hypertensive heart and chronic kidney disease with heart failure and stage 1 through stage 4 chronic kidney disease, or unspecified chronic kidney disease: Secondary | ICD-10-CM | POA: Diagnosis not present

## 2018-11-17 DIAGNOSIS — Z803 Family history of malignant neoplasm of breast: Secondary | ICD-10-CM

## 2018-11-17 DIAGNOSIS — Z7982 Long term (current) use of aspirin: Secondary | ICD-10-CM

## 2018-11-17 DIAGNOSIS — I2510 Atherosclerotic heart disease of native coronary artery without angina pectoris: Secondary | ICD-10-CM | POA: Diagnosis present

## 2018-11-17 DIAGNOSIS — E1122 Type 2 diabetes mellitus with diabetic chronic kidney disease: Secondary | ICD-10-CM | POA: Diagnosis present

## 2018-11-17 DIAGNOSIS — IMO0001 Reserved for inherently not codable concepts without codable children: Secondary | ICD-10-CM

## 2018-11-17 DIAGNOSIS — I5043 Acute on chronic combined systolic (congestive) and diastolic (congestive) heart failure: Secondary | ICD-10-CM | POA: Diagnosis present

## 2018-11-17 DIAGNOSIS — Z9119 Patient's noncompliance with other medical treatment and regimen: Secondary | ICD-10-CM | POA: Diagnosis not present

## 2018-11-17 DIAGNOSIS — F319 Bipolar disorder, unspecified: Secondary | ICD-10-CM | POA: Diagnosis present

## 2018-11-17 DIAGNOSIS — Z20828 Contact with and (suspected) exposure to other viral communicable diseases: Secondary | ICD-10-CM | POA: Diagnosis present

## 2018-11-17 DIAGNOSIS — M7989 Other specified soft tissue disorders: Secondary | ICD-10-CM | POA: Diagnosis not present

## 2018-11-17 DIAGNOSIS — Z886 Allergy status to analgesic agent status: Secondary | ICD-10-CM

## 2018-11-17 DIAGNOSIS — J449 Chronic obstructive pulmonary disease, unspecified: Secondary | ICD-10-CM | POA: Diagnosis present

## 2018-11-17 DIAGNOSIS — E662 Morbid (severe) obesity with alveolar hypoventilation: Secondary | ICD-10-CM | POA: Diagnosis present

## 2018-11-17 DIAGNOSIS — Z7901 Long term (current) use of anticoagulants: Secondary | ICD-10-CM

## 2018-11-17 DIAGNOSIS — I509 Heart failure, unspecified: Secondary | ICD-10-CM

## 2018-11-17 DIAGNOSIS — E538 Deficiency of other specified B group vitamins: Secondary | ICD-10-CM | POA: Diagnosis present

## 2018-11-17 DIAGNOSIS — N179 Acute kidney failure, unspecified: Secondary | ICD-10-CM | POA: Diagnosis present

## 2018-11-17 DIAGNOSIS — I10 Essential (primary) hypertension: Secondary | ICD-10-CM | POA: Diagnosis present

## 2018-11-17 DIAGNOSIS — E1165 Type 2 diabetes mellitus with hyperglycemia: Secondary | ICD-10-CM | POA: Diagnosis not present

## 2018-11-17 DIAGNOSIS — Z720 Tobacco use: Secondary | ICD-10-CM

## 2018-11-17 DIAGNOSIS — D509 Iron deficiency anemia, unspecified: Secondary | ICD-10-CM | POA: Diagnosis present

## 2018-11-17 DIAGNOSIS — Z6841 Body Mass Index (BMI) 40.0 and over, adult: Secondary | ICD-10-CM

## 2018-11-17 DIAGNOSIS — Z9111 Patient's noncompliance with dietary regimen: Secondary | ICD-10-CM

## 2018-11-17 DIAGNOSIS — Z823 Family history of stroke: Secondary | ICD-10-CM

## 2018-11-17 DIAGNOSIS — E876 Hypokalemia: Secondary | ICD-10-CM | POA: Diagnosis not present

## 2018-11-17 DIAGNOSIS — N182 Chronic kidney disease, stage 2 (mild): Secondary | ICD-10-CM | POA: Diagnosis present

## 2018-11-17 DIAGNOSIS — R609 Edema, unspecified: Secondary | ICD-10-CM

## 2018-11-17 DIAGNOSIS — Z800 Family history of malignant neoplasm of digestive organs: Secondary | ICD-10-CM

## 2018-11-17 DIAGNOSIS — Z8249 Family history of ischemic heart disease and other diseases of the circulatory system: Secondary | ICD-10-CM

## 2018-11-17 DIAGNOSIS — J9611 Chronic respiratory failure with hypoxia: Secondary | ICD-10-CM | POA: Diagnosis present

## 2018-11-17 LAB — CBC
HCT: 32.7 % — ABNORMAL LOW (ref 36.0–46.0)
Hemoglobin: 10.1 g/dL — ABNORMAL LOW (ref 12.0–15.0)
MCH: 26.2 pg (ref 26.0–34.0)
MCHC: 30.9 g/dL (ref 30.0–36.0)
MCV: 84.7 fL (ref 80.0–100.0)
Platelets: 296 10*3/uL (ref 150–400)
RBC: 3.86 MIL/uL — ABNORMAL LOW (ref 3.87–5.11)
RDW: 16.8 % — ABNORMAL HIGH (ref 11.5–15.5)
WBC: 7.9 10*3/uL (ref 4.0–10.5)
nRBC: 0 % (ref 0.0–0.2)

## 2018-11-17 LAB — BASIC METABOLIC PANEL
Anion gap: 10 (ref 5–15)
BUN: 48 mg/dL — ABNORMAL HIGH (ref 6–20)
CO2: 26 mmol/L (ref 22–32)
Calcium: 8.8 mg/dL — ABNORMAL LOW (ref 8.9–10.3)
Chloride: 102 mmol/L (ref 98–111)
Creatinine, Ser: 2.01 mg/dL — ABNORMAL HIGH (ref 0.44–1.00)
GFR calc Af Amer: 31 mL/min — ABNORMAL LOW (ref >60)
GFR calc non Af Amer: 26 mL/min — ABNORMAL LOW (ref >60)
Glucose, Bld: 236 mg/dL — ABNORMAL HIGH (ref 70–99)
Potassium: 3.6 mmol/L (ref 3.5–5.1)
Sodium: 138 mmol/L (ref 135–145)

## 2018-11-17 LAB — TROPONIN I: Troponin I: <0.03 ng/mL (ref <0.03)

## 2018-11-17 LAB — BRAIN NATRIURETIC PEPTIDE: B Natriuretic Peptide: 620.7 pg/mL — ABNORMAL HIGH (ref 0.0–100.0)

## 2018-11-17 LAB — GLUCOSE, CAPILLARY: Glucose-Capillary: 255 mg/dL — ABNORMAL HIGH (ref 70–99)

## 2018-11-17 MED ORDER — FUROSEMIDE 10 MG/ML IJ SOLN
40.0000 mg | Freq: Two times a day (BID) | INTRAMUSCULAR | Status: DC
  Administered 2018-11-18: 40 mg via INTRAVENOUS
  Filled 2018-11-17: qty 4

## 2018-11-17 MED ORDER — ACETAMINOPHEN 325 MG PO TABS: 650.0000 mg | ORAL_TABLET | ORAL | Status: DC | PRN

## 2018-11-17 MED ORDER — ALBUTEROL SULFATE HFA 108 (90 BASE) MCG/ACT IN AERS
8.0000 | INHALATION_SPRAY | Freq: Once | RESPIRATORY_TRACT | Status: AC
  Administered 2018-11-17: 8 via RESPIRATORY_TRACT
  Filled 2018-11-17: qty 6.7

## 2018-11-17 MED ORDER — UMECLIDINIUM-VILANTEROL 62.5-25 MCG/INH IN AEPB
1.0000 | INHALATION_SPRAY | Freq: Every day | RESPIRATORY_TRACT | Status: DC
  Administered 2018-11-18 – 2018-11-20 (×3): 1 via RESPIRATORY_TRACT
  Filled 2018-11-17: qty 14

## 2018-11-17 MED ORDER — INSULIN ASPART 100 UNIT/ML ~~LOC~~ SOLN
0.0000 [IU] | Freq: Three times a day (TID) | SUBCUTANEOUS | Status: DC
  Administered 2018-11-18 (×3): 15 [IU] via SUBCUTANEOUS
  Administered 2018-11-19: 7 [IU] via SUBCUTANEOUS
  Administered 2018-11-20 (×3): 4 [IU] via SUBCUTANEOUS

## 2018-11-17 MED ORDER — SODIUM CHLORIDE 0.9 % IV SOLN: 250.0000 mL | INTRAVENOUS | Status: DC | PRN

## 2018-11-17 MED ORDER — ENSURE MAX PROTEIN PO LIQD
11.0000 [oz_av] | Freq: Two times a day (BID) | ORAL | Status: DC
  Administered 2018-11-18 – 2018-11-20 (×6): 11 [oz_av] via ORAL
  Filled 2018-11-17 (×6): qty 330

## 2018-11-17 MED ORDER — POLYETHYLENE GLYCOL 3350 17 G PO PACK
17.0000 g | PACK | ORAL | Status: DC
  Administered 2018-11-20: 17 g via ORAL
  Filled 2018-11-17: qty 1

## 2018-11-17 MED ORDER — SODIUM CHLORIDE 0.9% FLUSH
3.0000 mL | Freq: Two times a day (BID) | INTRAVENOUS | Status: DC
  Administered 2018-11-17 – 2018-11-20 (×6): 3 mL via INTRAVENOUS

## 2018-11-17 MED ORDER — SODIUM CHLORIDE 0.9% FLUSH: 3.0000 mL | INTRAVENOUS | Status: DC | PRN

## 2018-11-17 MED ORDER — ASPIRIN 81 MG PO CHEW
81.0000 mg | CHEWABLE_TABLET | Freq: Every day | ORAL | Status: DC
  Administered 2018-11-18 – 2018-11-20 (×3): 81 mg via ORAL
  Filled 2018-11-17 (×3): qty 1

## 2018-11-17 MED ORDER — AEROCHAMBER Z-STAT PLUS/MEDIUM MISC
1.0000 | Freq: Once | Status: DC
  Filled 2018-11-17: qty 1

## 2018-11-17 MED ORDER — PREDNISONE 20 MG PO TABS
60.0000 mg | ORAL_TABLET | Freq: Once | ORAL | Status: AC
  Administered 2018-11-17: 60 mg via ORAL
  Filled 2018-11-17: qty 3

## 2018-11-17 MED ORDER — TORSEMIDE 20 MG PO TABS
40.0000 mg | ORAL_TABLET | Freq: Every day | ORAL | Status: DC
  Administered 2018-11-18: 40 mg via ORAL
  Filled 2018-11-17: qty 2

## 2018-11-17 MED ORDER — ALBUTEROL SULFATE (2.5 MG/3ML) 0.083% IN NEBU: 2.5000 mg | INHALATION_SOLUTION | Freq: Four times a day (QID) | RESPIRATORY_TRACT | Status: DC | PRN

## 2018-11-17 MED ORDER — POTASSIUM CHLORIDE CRYS ER 20 MEQ PO TBCR
20.0000 meq | EXTENDED_RELEASE_TABLET | Freq: Every day | ORAL | Status: DC
  Administered 2018-11-18 – 2018-11-20 (×3): 20 meq via ORAL
  Filled 2018-11-17 (×3): qty 1

## 2018-11-17 MED ORDER — INSULIN ASPART 100 UNIT/ML ~~LOC~~ SOLN: 0.0000 [IU] | Freq: Every day | SUBCUTANEOUS | Status: DC

## 2018-11-17 MED ORDER — SODIUM CHLORIDE 0.9% FLUSH: 3.0000 mL | Freq: Once | INTRAVENOUS | Status: DC

## 2018-11-17 MED ORDER — SPIRONOLACTONE 12.5 MG HALF TABLET
12.5000 mg | ORAL_TABLET | Freq: Every day | ORAL | Status: DC
  Administered 2018-11-18 – 2018-11-20 (×3): 12.5 mg via ORAL
  Filled 2018-11-17 (×3): qty 1

## 2018-11-17 MED ORDER — FERROUS GLUCONATE 324 (38 FE) MG PO TABS
324.0000 mg | ORAL_TABLET | Freq: Every day | ORAL | Status: DC
  Administered 2018-11-18 – 2018-11-20 (×3): 324 mg via ORAL
  Filled 2018-11-17 (×4): qty 1

## 2018-11-17 MED ORDER — HEPARIN SODIUM (PORCINE) 5000 UNIT/ML IJ SOLN
5000.0000 [IU] | Freq: Three times a day (TID) | INTRAMUSCULAR | Status: DC
  Administered 2018-11-18 – 2018-11-20 (×7): 5000 [IU] via SUBCUTANEOUS
  Filled 2018-11-17 (×7): qty 1

## 2018-11-17 MED ORDER — CARVEDILOL 3.125 MG PO TABS: 3.1250 mg | ORAL_TABLET | Freq: Two times a day (BID) | ORAL | Status: DC

## 2018-11-17 MED ORDER — AEROCHAMBER PLUS FLO-VU MEDIUM MISC
1.0000 | Freq: Once | Status: AC
  Administered 2018-11-17: 1
  Filled 2018-11-17: qty 1

## 2018-11-17 MED ORDER — INSULIN DETEMIR 100 UNIT/ML ~~LOC~~ SOLN
12.0000 [IU] | Freq: Every day | SUBCUTANEOUS | Status: DC
  Administered 2018-11-17 – 2018-11-19 (×2): 12 [IU] via SUBCUTANEOUS
  Filled 2018-11-17 (×4): qty 0.12

## 2018-11-17 MED ORDER — SENNOSIDES-DOCUSATE SODIUM 8.6-50 MG PO TABS
1.0000 | ORAL_TABLET | Freq: Two times a day (BID) | ORAL | Status: DC
  Administered 2018-11-18 – 2018-11-20 (×5): 1 via ORAL
  Filled 2018-11-17 (×5): qty 1

## 2018-11-17 MED ORDER — ISOSORB DINITRATE-HYDRALAZINE 20-37.5 MG PO TABS
0.5000 | ORAL_TABLET | Freq: Three times a day (TID) | ORAL | Status: DC
  Administered 2018-11-17 – 2018-11-20 (×8): 0.5
  Filled 2018-11-17 (×9): qty 1

## 2018-11-17 MED ORDER — ONDANSETRON HCL 4 MG/2ML IJ SOLN: 4.0000 mg | Freq: Four times a day (QID) | INTRAMUSCULAR | Status: DC | PRN

## 2018-11-17 MED ORDER — ROSUVASTATIN CALCIUM 20 MG PO TABS
40.0000 mg | ORAL_TABLET | Freq: Every day | ORAL | Status: DC
  Administered 2018-11-18 – 2018-11-20 (×3): 40 mg via ORAL
  Filled 2018-11-17 (×3): qty 2

## 2018-11-17 MED ORDER — FUROSEMIDE 10 MG/ML IJ SOLN
40.0000 mg | Freq: Once | INTRAMUSCULAR | Status: AC
  Administered 2018-11-17: 40 mg via INTRAVENOUS
  Filled 2018-11-17: qty 4

## 2018-11-17 MED ORDER — PANTOPRAZOLE SODIUM 40 MG PO TBEC
40.0000 mg | DELAYED_RELEASE_TABLET | Freq: Every day | ORAL | Status: DC
  Administered 2018-11-18 – 2018-11-20 (×3): 40 mg via ORAL
  Filled 2018-11-17 (×3): qty 1

## 2018-11-17 NOTE — Telephone Encounter (Signed)
Patients call taken.  Patient identified by name and date of birth.  Patient called complaining of right leg swelling.  Patient was seeking advise as to what to do before her Monday appointment.    Patient advised to go to Urgent Care or the Emergency Department to be assessed.  Patient acknowledged understanding of advice.

## 2018-11-17 NOTE — ED Triage Notes (Signed)
Pt reports increased swelling in her legs, hx of CHF. Wears 4L [place] at baseline, denies any SOB/CP.

## 2018-11-17 NOTE — ED Provider Notes (Signed)
[facility] Provider Note   CSN: [id number] Arrival date & time: 11/17/18  1626     History   Chief Complaint Chief Complaint  Patient presents with   Leg Swelling    HPI Tamara Silva is a 58 y.o. female.     The history is provided by the patient and medical records.  Shortness of Breath Severity:  Moderate Onset quality:  Gradual Duration:  2 days Timing:  Constant Progression:  Worsening Chronicity:  Recurrent Context: activity   Context: not known allergens, not smoke exposure and not URI   Relieved by:  Nothing Worsened by:  Activity, exertion, movement and stress Ineffective treatments:  Position changes, sitting up, lying down and diuretics Associated symptoms: chest pain, PND and wheezing   Associated symptoms: no abdominal pain, no cough, no diaphoresis, no fever, no hemoptysis, no rash, no sputum production and no vomiting   Risk factors: obesity   Risk factors: no recent alcohol use     Past Medical History:  Diagnosis Date   Asthma    CAD (coronary artery disease)    Chronic combined systolic (congestive) and diastolic (congestive) heart failure (HCC)    Chronic iron deficiency anemia    CVA (cerebral vascular accident) ([place]) 05/2018   Diabetes mellitus without complication ([place])    DOE (dyspnea on exertion)    Edema of both legs    Hepatic steatosis    Hypertension    Pulmonary emboli ([place]) 2014   Respiratory failure ([place]) 09/2018   Seizure ([place]) 05/2018    Patient Active Problem List   Diagnosis Date Noted   Cardiac arrest ([place]) 10/28/2018   Seizure ([place]) 10/28/2018   Noncompliance with treatment plan 07/24/2018   General patient noncompliance 07/24/2018   Noncompliance with diet and medication regimen 07/24/2018   Dysarthria    Morbid obesity with BMI of 60.0-69.9, adult (HCC)    BiPAP (biphasic positive airway pressure) dependence    Acute renal failure superimposed on  stage 3 chronic kidney disease ([place]) 07/08/2018   Pancytopenia ([place]) 07/08/2018   Type II diabetes mellitus with renal manifestations ([place]) 07/08/2018   Acute on chronic combined systolic and diastolic CHF (congestive heart failure) ([place]) 06/26/2018   Hepatic steatosis 06/22/2018   Chronic respiratory failure with hypoxia and hypercapnia (HCC)    Hyperammonemia (HCC)    Acute on chronic diastolic CHF (congestive heart failure) (HCC)    On home O2    Slow transit constipation    Encephalopathy 06/09/2018   Cocaine abuse ([place])    Diabetes mellitus type 2 in obese ([place])    Generalized tonic-clonic seizure ([place]) 06/05/2018   Cerebral embolism with cerebral infarction 06/01/2018   AKI (acute kidney injury) ([place]) 05/30/2018   Pressure injury of skin 05/30/2018   Acute on chronic anemia 05/29/2018   Chronic respiratory failure with hypoxia ([place]) 01/27/2018   Iron deficiency anemia 01/27/2018   Tobacco abuse disorder 10/03/2017   Dyslipidemia 04/29/2016   Primary insomnia 04/29/2016   Hypoxemia 09/08/2015   Congestive heart failure (CHF) ([place]) 09/08/2015   Obesity hypoventilation syndrome (HCC)    Microcytic anemia    Acute encephalopathy    Uncontrolled type 2 diabetes mellitus with hyperosmolarity without coma ([place])    Acute on chronic respiratory failure with hypercapnia (HCC)    CAP (community acquired pneumonia)    OSA (obstructive sleep apnea)    Acute and chr resp failure, unsp w hypoxia or hypercapnia ([place]) 06/22/2015   Acute on chronic combined  systolic (congestive) and diastolic (congestive) heart failure (HCC) 06/21/2015   Hypoglycemia 06/21/2015   Uncontrolled type 2 diabetes mellitus without complication, with long-term current use of insulin ([place])    Dyslipidemia associated with type 2 diabetes mellitus ([place]) 05/21/2015   Hyperkalemia 05/21/2015   COPD GOLD III  06/10/2014   Acute respiratory failure with hypoxia ([place]) 03/04/2014    CKD (chronic kidney disease), stage II 02/07/2014   COPD exacerbation ([place]) 02/01/2014   CAD S/P percutaneous coronary angioplasty - prior PCI to LAD; RCA PCI: new Xience Alpine DES 2.75 mm x 15 mm  10/07/2013   Morbid obesity ([place]) 07/30/2013   Chronic combined systolic and diastolic heart failure ([place]) 07/10/2013   Chronic pulmonary embolism ([place]) 02/08/2013   Bipolar disorder ([place]) 02/08/2013   Essential hypertension 02/08/2013   Chronic anticoagulation 11/27/2012   Edema extremities 10/15/2012   COPD (chronic obstructive pulmonary disease) ([place]) 10/15/2012   Pulmonary embolism on right ([place]) 10/15/2012   Hypertension 10/15/2012   Insulin dependent diabetes mellitus ([place]) 10/15/2012    Past Surgical History:  Procedure Laterality Date   BIOPSY  07/10/2018   Procedure: BIOPSY;  Surgeon: [name], MD;  Location: [facility];  Service: Gastroenterology;;   COLONOSCOPY WITH PROPOFOL N/A 08/03/2013   Procedure: COLONOSCOPY WITH PROPOFOL;  Surgeon: Jeryl Columbia, MD;  Location: [facility];  Service: Endoscopy;  Laterality: N/A;   COLONOSCOPY WITH PROPOFOL N/A 07/10/2018   Procedure: COLONOSCOPY WITH PROPOFOL;  Surgeon: [name], MD;  Location: [facility];  Service: Gastroenterology;  Laterality: N/A;   CORONARY ANGIOPLASTY WITH STENT PLACEMENT     CAD in 2006 x 2 and 2009 2 more- place din REx in [place] and [place] [facility]   CORONARY ANGIOPLASTY WITH STENT PLACEMENT  10/07/2013   Xience Alpine DES 2.75  mm x 15  mm   ESOPHAGOGASTRODUODENOSCOPY (EGD) WITH PROPOFOL N/A 08/03/2013   Procedure: ESOPHAGOGASTRODUODENOSCOPY (EGD) WITH PROPOFOL;  Surgeon: Jeryl Columbia, MD;  Location: [facility];  Service: Endoscopy;  Laterality: N/A;   ESOPHAGOGASTRODUODENOSCOPY (EGD) WITH PROPOFOL N/A 07/10/2018   Procedure: ESOPHAGOGASTRODUODENOSCOPY (EGD) WITH PROPOFOL;  Surgeon: [name], MD;  Location: [facility];  Service: Gastroenterology;  Laterality: N/A;   LEFT HEART  CATHETERIZATION WITH CORONARY ANGIOGRAM N/A 10/07/2013   Procedure: LEFT HEART CATHETERIZATION WITH CORONARY ANGIOGRAM;  Surgeon: Leonie Man, MD;  Location: [facility] CATH LAB;  Service: Cardiovascular;  Laterality: N/A;   RIGHT HEART CATH N/A 11/06/2018   Procedure: RIGHT HEART CATH;  Surgeon: Larey Dresser, MD;  Location: [facility] CV LAB;  Service: Cardiovascular;  Laterality: N/A;     OB History   No obstetric history on file.      Home Medications    Prior to Admission medications   Medication Sig Start Date End Date Taking? Authorizing Provider  ACCU-CHEK AVIVA PLUS test strip USE 1 STRIP TO CHECK GLUCOSE 3 TIMES DAILY Patient taking differently: 1 each by Other route 3 (three) times daily.  08/07/18   Fulp, Cammie, MD  albuterol (PROAIR HFA) 108 (90 Base) MCG/ACT inhaler INHALE 2 PUFFS BY MOUTH 4 TIMES DAILY AS NEEDED Patient taking differently: Inhale 2 puffs into the lungs 4 (four) times daily as needed for wheezing.  10/04/18   Fulp, Cammie, MD  aspirin 81 MG chewable tablet Chew 1 tablet (81 mg total) by mouth daily for 30 days. 11/07/18 12/07/18  Little Ishikawa, MD  carvedilol (COREG) 3.125 MG tablet Take 1 tablet (3.125 mg total) by mouth 2 (two) times daily  with a meal. 10/04/18   Fulp, Cammie, MD  Ensure Max Protein (ENSURE MAX PROTEIN) LIQD Take 330 mLs (11 oz total) by mouth 2 (two) times daily for 30 days. 11/07/18 12/07/18  Little Ishikawa, MD  ferrous gluconate (FERGON) 324 MG tablet Take 1 tablet (324 mg total) by mouth daily with breakfast. Office visit needed prior to future refills 10/04/18   Fulp, Cammie, MD  hydrocerin (EUCERIN) CREA Apply 1 application topically 2 (two) times daily. 06/22/18   Love, Ivan Anchors, PA-C  insulin detemir (LEVEMIR) 100 UNIT/ML injection Inject 0.12 mLs (12 Units total) into the skin at bedtime. 11/07/18   Little Ishikawa, MD  ipratropium-albuterol (DUONEB) 0.5-2.5 (3) MG/3ML SOLN Use nebulizer to inhale contents of one vial every 6 hours as  needed for shortness of breath, wheezing 10/04/18   Fulp, Cammie, MD  isosorbide-hydrALAZINE (BIDIL) 20-37.5 MG tablet Place 0.5 tablets into feeding tube 3 (three) times daily for 30 days. 11/07/18 12/07/18  Little Ishikawa, MD  NEEDLE, DISP, 26 G 26G X 1/2" MISC 1 application by Does not apply route daily. 06/22/18   Love, Ivan Anchors, PA-C  omeprazole (PRILOSEC) 40 MG capsule Take 1 capsule (40 mg total) by mouth daily. 10/04/18   Fulp, Cammie, MD  polyethylene glycol (MIRALAX) 17 g packet Take 17 g by mouth every Monday, Wednesday, and Friday. 10/04/18   Fulp, Cammie, MD  potassium chloride SA (K-DUR) 20 MEQ tablet Take 1 tablet (20 mEq total) by mouth daily for 30 days. 11/07/18 12/07/18  Little Ishikawa, MD  rosuvastatin (CRESTOR) 40 MG tablet Take 1 tablet (40 mg total) by mouth daily at 6 PM. 10/04/18   Fulp, Cammie, MD  senna-docusate (SENOKOT-S) 8.6-50 MG tablet Take 1 tablet by mouth 2 (two) times daily. If needed for constipation 10/04/18   Fulp, Cammie, MD  spironolactone (ALDACTONE) 25 MG tablet Take 0.5 tablets (12.5 mg total) by mouth daily for 30 days. 11/07/18 12/07/18  Little Ishikawa, MD  torsemide (DEMADEX) 20 MG tablet Take 2 tablets (40 mg total) by mouth daily for 30 days. 11/07/18 12/07/18  Little Ishikawa, MD  umeclidinium-vilanterol (ANORO ELLIPTA) 62.5-25 MCG/INH AEPB Inhale 1 puff into the lungs daily.    [provider]    Family History Family History  Problem Relation Age of Onset   Stroke Mother    Hypertension Mother    Colon cancer Father    Diabetes Father    Heart attack Father    Sarcoidosis Other    Breast cancer Paternal Aunt    Emphysema Brother    Lung disease Brother        Unknown type, 3 brothers, one with liver and lung disease   Asthma Paternal Aunt     Social History Social History   Tobacco Use   Smoking status: Former Smoker    Packs/day: 0.50    Years: 20.00    Pack years: 10.00    Types: Cigarettes    Quit date:  04/30/2014    Years since quitting: 4.5   Smokeless tobacco: Never Used   Tobacco comment: smoked off and on x 20 years  Substance Use Topics   Alcohol use: No    Alcohol/week: 0.0 standard drinks   Drug use: Not Currently    Types: Cocaine     Allergies   Metolazone, Plavix [clopidogrel bisulfate], Ibuprofen, and Nsaids   Review of Systems Review of Systems  Constitutional: Negative for diaphoresis and fever.  Respiratory: Positive for  shortness of breath and wheezing. Negative for cough, hemoptysis and sputum production.   Cardiovascular: Positive for chest pain and PND.  Gastrointestinal: Negative for abdominal pain and vomiting.  Skin: Negative for rash.  All other systems reviewed and are negative.    Physical Exam Updated Vital Signs BP 118/61    Pulse 66    Temp 98.4 F (36.9 C) (Oral)    Resp 18    LMP 07/28/2013 Comment: perimenopausal   SpO2 100%   Physical Exam Vitals signs and nursing note reviewed.  Constitutional:      Appearance: She is well-developed. She is not toxic-appearing.  HENT:     Head: Normocephalic and atraumatic.     Mouth/Throat:     Mouth: Mucous membranes are moist.  Eyes:     Extraocular Movements: Extraocular movements intact.     Conjunctiva/sclera: Conjunctivae normal.  Neck:     Musculoskeletal: Neck supple. No muscular tenderness.  Cardiovascular:     Rate and Rhythm: Normal rate.  Pulmonary:     Effort: No respiratory distress.     Breath sounds: No stridor. Wheezing and rales present. No rhonchi.     Comments: Patient on 4 L nasal cannula, 96% Abdominal:     Palpations: Abdomen is soft.     Tenderness: There is no abdominal tenderness.  Musculoskeletal:     Right lower leg: Edema present.     Left lower leg: Edema present.     Comments: 2+ bilateral lower extremity pitting edema  Skin:    General: Skin is warm and dry.     Capillary Refill: Capillary refill takes less than 2 seconds.     Findings: No rash.    Neurological:     Mental Status: She is alert and oriented to person, place, and time.  Psychiatric:        Behavior: Behavior normal.      ED Treatments / Results  Labs (all labs ordered are listed, but only abnormal results are displayed) Labs Reviewed  BASIC METABOLIC PANEL - Abnormal; Notable for the following components:      Result Value   Glucose, Bld 236 (*)    BUN 48 (*)    Creatinine, Ser 2.01 (*)    Calcium 8.8 (*)    GFR calc non Af Amer 26 (*)    GFR calc Af Amer 31 (*)    All other components within normal limits  CBC - Abnormal; Notable for the following components:   RBC 3.86 (*)    Hemoglobin 10.1 (*)    HCT 32.7 (*)    RDW 16.8 (*)    All other components within normal limits  TROPONIN I  BRAIN NATRIURETIC PEPTIDE    EKG    Radiology Dg Chest 2 View  Result Date: 11/17/2018 CLINICAL DATA:  Heart failure. EXAM: CHEST - 2 VIEW COMPARISON:  10/31/2018 FINDINGS: Cardiomediastinal silhouette is enlarged. Mediastinal contours appear intact. There is no evidence of focal airspace consolidation, pleural effusion or pneumothorax. Chronic scarring in the left mid lung field. Interstitial pulmonary edema. Osseous structures are without acute abnormality. Soft tissues are grossly normal. IMPRESSION: 1. Enlarged cardiac silhouette. 2. Interstitial pulmonary edema. Electronically Signed   By: Fidela Salisbury M.D.   On: 11/17/2018 17:52    Procedures Procedures (including critical care time)  Medications Ordered in ED Medications  sodium chloride flush (NS) 0.9 % injection 3 mL (has no administration in time range)  furosemide (LASIX) injection 40 mg (has no administration in  time range)  predniSONE (DELTASONE) tablet 60 mg (has no administration in time range)  albuterol (VENTOLIN HFA) 108 (90 Base) MCG/ACT inhaler 8 puff (has no administration in time range)  aerochamber Z-Stat Plus/medium 1 each (has no administration in time range)     Initial  Impression / Assessment and Plan / ED Course  I have reviewed the triage vital signs and the nursing notes.  Pertinent labs & imaging results that were available during my care of the patient were reviewed by me and considered in my medical decision making (see chart for details).        Medical Decision Making: Elize Pinon is a 58 y.o. female who presented to the ED today with shortness of breath, bilateral lower extremity pitting edema.  Past medical history significant for systolic CHF ejection fraction 40-45%-January 2020, obesity hypoventilation on BiPAP at bedtime, diabetes mellitus type 2, CKD, CAD, recent hospitalization for COPD, heart failure exacerbation complicated by seizure with PEA arrest requiring CPR, discharged on 11/07/2018 Reviewed and confirmed nursing documentation for past medical history, family history, social history.  On my initial exam, the pt was calm, cooperative, conversant, follows commands appropriately, GCS 15, not tachycardic, not hypotensive, afebrile, on 4 L nasal cannula, O2 sat 96%, diffuse wheezing with coarse breath sounds and rales throughout.  We will give beta agonist therapy, steroids, IV diuretic, reassess  Viral versus bacterial pneumonia considered however patient denies any infectious review of systems, no fevers, chills, no newcough, will obtain chest x-ray for further evaluation Pneumothorax considered however bilateral breath sounds, will obtain chest x-ray for further evaluation Volume overload could be contributory given bilateral lower extremity pitting edema, history of heart failure with recent exacerbation  ACS considered will obtain EKG for further assessment, no active chest pain in emergency department VTE considered however patient low risk Wells score, no obvious asymmetric bilateral upper extremity, bilateral lower extremity swelling  Chest x-ray shows pulmonary edema with cardiomegaly Creatinine 2.0, baseline unclear given  recent acute kidney injury however creatinine improved from 2.56 on previous lab draw on 6/9 BUN 48, improving from previous lab draw White blood cell 7.9, hemoglobin 10.1, BNP 620  EKG (my interpretation):      NS rhythm with a rate of  66.      QRS 107. QTc 535.      Non specific ST segment changes       No acute changes suggestive of hyperkalemia.      No WPW, or Brugada's Syndrome. When compared to previous ECGs from 10/28/18 change no new concerning changes found.  All radiology and laboratory studies reviewed independently and with my attending physician, agree with reading provided by radiologist unless otherwise noted.  Upon reassessing patient, patient was calm, still feels short of breath, given significant recent admission will admit patient for continued diuretic therapy and evaluation Based on the above findings, I believe patient requires admission.  Patient admitted. The above care was discussed with and agreed upon by my attending physician. Emergency Department Medication Summary:  Medications  sodium chloride flush (NS) 0.9 % injection 3 mL (has no administration in time range)  furosemide (LASIX) injection 40 mg (has no administration in time range)  predniSONE (DELTASONE) tablet 60 mg (has no administration in time range)  albuterol (VENTOLIN HFA) 108 (90 Base) MCG/ACT inhaler 8 puff (has no administration in time range)  aerochamber Z-Stat Plus/medium 1 each (has no administration in time range)   Final Clinical Impressions(s) / ED Diagnoses   Final  diagnoses:  None    ED Discharge Orders    None       Lonzo Candy, MD 11/17/18 2142    Isla Pence, MD 11/17/18 2245

## 2018-11-17 NOTE — Telephone Encounter (Signed)
New Message  Pt having swelling in legs informed of appt but pt still wanted to speak with someone

## 2018-11-17 NOTE — ED Notes (Signed)
ED TO INPATIENT HANDOFF REPORT  ED Nurse Name and Phone #:   Lenice Pressman [phone]  S Name/Age/Gender Tamara Silva 58 y.o. female Room/Bed: 021C/021C  Code Status   Code Status: Prior  Home/SNF/Other Home Patient oriented to: self, place, time and situation Is this baseline? Yes   Triage Complete: Triage complete  Chief Complaint heart failure swelling  Triage Note Pt reports increased swelling in her legs, hx of CHF. Wears 4L [place] at baseline, denies any SOB/CP.    Allergies Allergies  Allergen Reactions  . Metolazone Other (See Comments)    Dizziness and falling  . Plavix [Clopidogrel Bisulfate] Other (See Comments)    High PRU's - non-responder  . Ibuprofen Other (See Comments)    Pt states she is not supposed to take ibuprofen because of other meds she is taking  . Nsaids Other (See Comments)    "I do not take NSAIDs d/t interfering with other meds"    Level of Care/Admitting Diagnosis ED Disposition    ED Disposition Condition [place]: [place] [[id number]]  Level of Care: Telemetry Cardiac [103]  Covid Evaluation: N/A  Diagnosis: CHF exacerbation [facility]) [[id number]]  Admitting Physician: Elwyn Reach [[id number]]  Attending Physician: Elwyn Reach [[id number]]  Estimated length of stay: past midnight tomorrow  Certification:: I certify this patient will need inpatient services for at least 2 midnights  PT Class (Do Not Modify): Inpatient [101]  PT Acc Code (Do Not Modify): Private [1]       B Medical/Surgery History Past Medical History:  Diagnosis Date  . Asthma   . CAD (coronary artery disease)   . Chronic combined systolic (congestive) and diastolic (congestive) heart failure ([place])   . Chronic iron deficiency anemia   . CVA (cerebral vascular accident) ([place]) 05/2018  . Diabetes mellitus without complication ([place])   . DOE (dyspnea on exertion)   . Edema of both legs   . Hepatic steatosis   . Hypertension   .  Pulmonary emboli ([place]) 2014  . Respiratory failure ([place]) 09/2018  . Seizure ([place]) 05/2018   Past Surgical History:  Procedure Laterality Date  . BIOPSY  07/10/2018   Procedure: BIOPSY;  Surgeon: [name], MD;  Location: [place];  Service: Gastroenterology;;  . COLONOSCOPY WITH PROPOFOL N/A 08/03/2013   Procedure: COLONOSCOPY WITH PROPOFOL;  Surgeon: Jeryl Columbia, MD;  [facility];  Service: Endoscopy;  Laterality: N/A;  . COLONOSCOPY WITH PROPOFOL N/A 07/10/2018   Procedure: COLONOSCOPY WITH PROPOFOL;  Surgeon: [name], MD;  Location: [place];  Service: Gastroenterology;  Laterality: N/A;  . CORONARY ANGIOPLASTY WITH STENT PLACEMENT     CAD in 2006 x 2 and 2009 2 more- place din REx in [place] and [place] med  . CORONARY ANGIOPLASTY WITH STENT PLACEMENT  10/07/2013   Xience [place]pine DES 2.75  mm x 15  mm  . ESOPHAGOGASTRODUODENOSCOPY (EGD) WITH PROPOFOL N/A 08/03/2013   Procedure: ESOPHAGOGASTRODUODENOSCOPY (EGD) WITH PROPOFOL;  Surgeon: Jeryl Columbia, MD;  [facility];  Service: Endoscopy;  Laterality: N/A;  . ESOPHAGOGASTRODUODENOSCOPY (EGD) WITH PROPOFOL N/A 07/10/2018   Procedure: ESOPHAGOGASTRODUODENOSCOPY (EGD) WITH PROPOFOL;  Surgeon: [name], MD;  Location: [place];  Service: Gastroenterology;  Laterality: N/A;  . LEFT HEART CATHETERIZATION WITH CORONARY ANGIOGRAM N/A 10/07/2013   Procedure: LEFT HEART CATHETERIZATION WITH CORONARY ANGIOGRAM;  Surgeon: Leonie Man, MD;  Location: [facility] C[facility] LAB;  Service: Cardiovascular;  Laterality: N/A;  . RIGHT HEART  CATH N/A 11/06/2018   Procedure: RIGHT HEART CATH;  Surgeon: Larey Dresser, MD;  Location: [place] CV LAB;  Service: Cardiovascular;  Laterality: N/A;     A IV Location/Drains/Wounds Patient Lines/Drains/Airways Status   Active Line/Drains/Airways    Name:   Placement date:   Placement time:   Site:   Days:   Peripheral IV 11/17/18 Left Antecubital   11/17/18    1954    Antecubital   less  than 1          Intake/Output Last 24 hours No intake or output data in the 24 hours ending 11/17/18 2207  Labs/Imaging Results for orders placed or performed during the hospital encounter of 11/17/18 (from the past 48 hour(s))  Basic metabolic panel     Status: Abnormal   Collection Time: 11/17/18  4:55 PM  Result Value Ref Range   Sodium 138 135 - 145 mmol/L   Potassium 3.6 3.5 - 5.1 mmol/L   Chloride 102 98 - 111 mmol/L   CO2 26 22 - 32 mmol/L   Glucose, Bld 236 (H) 70 - 99 mg/dL   BUN 48 (H) 6 - 20 mg/dL   Creatinine, Ser 2.01 (H) 0.44 - 1.00 mg/dL   Calcium 8.8 (L) 8.9 - 10.3 mg/dL   GFR calc non Af Amer 26 (L) >60 mL/min   GFR calc Af Amer 31 (L) >60 mL/min   Anion gap 10 5 - 15    Comment: Performed at [facility], [address], [place], [place] [address]  CBC     Status: Abnormal   Collection Time: 11/17/18  4:55 PM  Result Value Ref Range   WBC 7.9 4.0 - 10.5 K/uL   RBC 3.86 (L) 3.87 - 5.11 MIL/uL   Hemoglobin 10.1 (L) 12.0 - 15.0 g/dL   HCT 32.7 (L) 36.0 - 46.0 %   MCV 84.7 80.0 - 100.0 fL   MCH 26.2 26.0 - 34.0 pg   MCHC 30.9 30.0 - 36.0 g/dL   RDW 16.8 (H) 11.5 - 15.5 %   Platelets 296 150 - 400 K/uL   nRBC 0.0 0.0 - 0.2 %    Comment: Performed at [facility] [address], [place], [place] [address]  Brain natriuretic peptide     Status: Abnormal   Collection Time: 11/17/18  7:32 PM  Result Value Ref Range   B Natriuretic Peptide 620.7 (H) 0.0 - 100.0 pg/mL    Comment: Performed at [facility] [address], [place], [place] [address]   Dg Chest 2 View  Result Date: 11/17/2018 CLINICAL DATA:  Heart failure. EXAM: CHEST - 2 VIEW COMPARISON:  10/31/2018 FINDINGS: Cardiomediastinal silhouette is enlarged. Mediastinal contours appear intact. There is no evidence of focal airspace consolidation, pleural effusion or pneumothorax. Chronic scarring in the left mid lung field. Interstitial pulmonary edema. Osseous structures are without  acute abnormality. Soft tissues are grossly normal. IMPRESSION: 1. Enlarged cardiac silhouette. 2. Interstitial pulmonary edema. Electronically Signed   By: Fidela Salisbury M.D.   On: 11/17/2018 17:52    Pending Labs Unresulted Labs (From admission, onward)    Start     Ordered   11/17/18 2135  Novel Coronavirus,NAA,(SEND-OUT TO REF LAB - TAT 24-48 hrs); Hosp Order  (Asymptomatic Patients Labs)  Once,   STAT    Question:  Rule Out  Answer:  Yes   11/17/18 2134   11/17/18 1932  Troponin I - ONCE - STAT  ONCE - STAT,   STAT     11/17/18 1932   Signed and Held  Basic metabolic panel  Daily,   R     Signed and Held   Signed and Held  CBC  (heparin)  Once,   R    Comments: Baseline for heparin therapy IF NOT ALREADY DRAWN.  Notify MD if PLT < 100 K.    Signed and Held   Signed and Held  Creatinine, serum  (heparin)  Once,   R    Comments: Baseline for heparin therapy IF NOT ALREADY DRAWN.    Signed and Held          Vitals/Pain Today's Vitals   11/17/18 2045 11/17/18 2100 11/17/18 2115 11/17/18 2200  BP: 125/64 120/68 113/61   Pulse: 66 65 63 66  Resp: 18 17 (!) 22 (!) 28  Temp:      TempSrc:      SpO2: 100% 96% 97% 96%  PainSc:        Isolation Precautions No active isolations  Medications Medications  sodium chloride flush (NS) 0.9 % injection 3 mL (has no administration in time range)  furosemide (LASIX) injection 40 mg (40 mg Intravenous Given 11/17/18 2011)  predniSONE (DELTASONE) tablet 60 mg (60 mg Oral Given 11/17/18 2010)  albuterol (VENTOLIN HFA) 108 (90 Base) MCG/ACT inhaler 8 puff (8 puffs Inhalation Given 11/17/18 2011)  AeroChamber Plus Flo-Vu Medium MISC 1 each (1 each Other Given 11/17/18 2013)    Mobility walks Moderate fall risk   Focused Assessments Pulmonary Assessment Handoff:  Lung sounds:   O2 Device: Nasal Cannula O2 Flow Rate (L/min): 4 L/min      R Recommendations: See Admitting Provider Note  Report given to:   Additional  Notes:

## 2018-11-17 NOTE — ED Notes (Signed)
Dr is in with pt at this moment will return to draw labs

## 2018-11-17 NOTE — H&P (Signed)
History and Physical   Tamara Silva JOA:[id number] DOB: 10/02/1960 DOA: 11/17/2018  Referring MD/NP/PA: Dr. Gilford Raid  PCP: Antony Blackbird, MD   Outpatient Specialists: Cardiology  Patient coming from: Home  Chief Complaint: Lower extremity edema  HPI: Tamara Silva is a 58 y.o. female with medical history significant of systolic dysfunction CHF with EF of 30 to 35% from last month echocardiogram, coronary artery disease, iron deficiency anemia, morbid obesity, obesity hypoventilation syndrome, hypertension, previous PEs and seizure disorder who came to the ER complaining of progressive and worsening shortness of breath and lower extremity edema.  Patient is noted to have worsening edema bilaterally.  Associated with mild shortness of breath.  Patient has worsening bilateral edema.  She has been taking oral Lasix.  She also has apparently been watching what she eats.  She is notably reported as noncompliant in the past.  Otherwise no fever or chills.  No contact with anybody with COVID-19...  ED Course: Temperature is 98 for blood pressure 113/61, pulse 68 respiratory rate of 28 oxygen sat 96% on 2 L.  White count 7.9 hemoglobin 10.9 and platelet 296.  Chemistry showed creatinine of 2.01 BUN 48 and calcium 8.8. BNP 620.7,   Review of Systems: As per HPI otherwise 10 point review of systems negative.    Past Medical History:  Diagnosis Date  . Asthma   . CAD (coronary artery disease)   . Chronic combined systolic (congestive) and diastolic (congestive) heart failure ([place])   . Chronic iron deficiency anemia   . CVA (cerebral vascular accident) ([place]) 05/2018  . Diabetes mellitus without complication ([place])   . DOE (dyspnea on exertion)   . Edema of both legs   . Hepatic steatosis   . Hypertension   . Pulmonary emboli ([place]) 2014  . Respiratory failure ([place]) 09/2018  . Seizure ([place]) 05/2018    Past Surgical History:  Procedure Laterality Date  . BIOPSY  07/10/2018   Procedure:  BIOPSY;  Surgeon: [name], MD;  Location: [place];  Service: Gastroenterology;;  . COLONOSCOPY WITH PROPOFOL N/A 08/03/2013   Procedure: COLONOSCOPY WITH PROPOFOL;  Surgeon: Jeryl Columbia, MD;  [facility];  Service: Endoscopy;  Laterality: N/A;  . COLONOSCOPY WITH PROPOFOL N/A 07/10/2018   Procedure: COLONOSCOPY WITH PROPOFOL;  Surgeon: [name], MD;  Location: [place];  Service: Gastroenterology;  Laterality: N/A;  . CORONARY ANGIOPLASTY WITH STENT PLACEMENT     CAD in 2006 x 2 and 2009 2 more- place din REx in [place] and [place] med  . CORONARY ANGIOPLASTY WITH STENT PLACEMENT  10/07/2013   Xience [place] DES 2.75  mm x 15  mm  . ESOPHAGOGASTRODUODENOSCOPY (EGD) WITH PROPOFOL N/A 08/03/2013   Procedure: ESOPHAGOGASTRODUODENOSCOPY (EGD) WITH PROPOFOL;  Surgeon: Jeryl Columbia, MD;  [facility];  Service: Endoscopy;  Laterality: N/A;  . ESOPHAGOGASTRODUODENOSCOPY (EGD) WITH PROPOFOL N/A 07/10/2018   Procedure: ESOPHAGOGASTRODUODENOSCOPY (EGD) WITH PROPOFOL;  Surgeon: [name], MD;  Location: [place];  Service: Gastroenterology;  Laterality: N/A;  . LEFT HEART CATHETERIZATION WITH CORONARY ANGIOGRAM N/A 10/07/2013   Procedure: LEFT HEART CATHETERIZATION WITH CORONARY ANGIOGRAM;  Surgeon: Leonie Man, MD;  Location: [facility] CATH LAB;  Service: Cardiovascular;  Laterality: N/A;  . RIGHT HEART CATH N/A 11/06/2018   Procedure: RIGHT HEART CATH;  Surgeon: Larey Dresser, MD;  Location: [place] CV LAB;  Service: Cardiovascular;  Laterality: N/A;     reports that she quit smoking about 4 years ago. Her smoking use included cigarettes.  She has a 10.00 pack-year smoking history. She has never used smokeless tobacco. She reports previous drug use. Drug: Cocaine. She reports that she does not drink alcohol.  Allergies  Allergen Reactions  . Metolazone Other (See Comments)    Dizziness and falling  . Plavix [Clopidogrel Bisulfate] Other (See Comments)    High PRU's  - non-responder  . Ibuprofen Other (See Comments)    Pt states she is not supposed to take ibuprofen because of other meds she is taking  . Nsaids Other (See Comments)    "I do not take NSAIDs d/t interfering with other meds"    Family History  Problem Relation Age of Onset  . Stroke Mother   . Hypertension Mother   . Colon cancer Father   . Diabetes Father   . Heart attack Father   . Sarcoidosis Other   . Breast cancer Paternal Aunt   . Emphysema Brother   . Lung disease Brother        Unknown type, 3 brothers, one with liver and lung disease  . Asthma Paternal Aunt      Prior to Admission medications   Medication Sig Start Date End Date Taking? Authorizing Provider  ACCU-CHEK AVIVA PLUS test strip USE 1 STRIP TO CHECK GLUCOSE 3 TIMES DAILY Patient taking differently: 1 each by Other route 3 (three) times daily.  08/07/18   Fulp, Cammie, MD  albuterol (PROAIR HFA) 108 (90 Base) MCG/ACT inhaler INHALE 2 PUFFS BY MOUTH 4 TIMES DAILY AS NEEDED Patient taking differently: Inhale 2 puffs into the lungs 4 (four) times daily as needed for wheezing.  10/04/18   Fulp, Cammie, MD  aspirin 81 MG chewable tablet Chew 1 tablet (81 mg total) by mouth daily for 30 days. 11/07/18 12/07/18  Little Ishikawa, MD  carvedilol (COREG) 3.125 MG tablet Take 1 tablet (3.125 mg total) by mouth 2 (two) times daily with a meal. 10/04/18   Fulp, Cammie, MD  Ensure Max Protein (ENSURE MAX PROTEIN) LIQD Take 330 mLs (11 oz total) by mouth 2 (two) times daily for 30 days. 11/07/18 12/07/18  Little Ishikawa, MD  ferrous gluconate (FERGON) 324 MG tablet Take 1 tablet (324 mg total) by mouth daily with breakfast. Office visit needed prior to future refills 10/04/18   Fulp, Cammie, MD  hydrocerin (EUCERIN) CREA Apply 1 application topically 2 (two) times daily. 06/22/18   Love, Ivan Anchors, PA-C  insulin detemir (LEVEMIR) 100 UNIT/ML injection Inject 0.12 mLs (12 Units total) into the skin at bedtime. 11/07/18   Little Ishikawa, MD  ipratropium-albuterol (DUONEB) 0.5-2.5 (3) MG/3ML SOLN Use nebulizer to inhale contents of one vial every 6 hours as needed for shortness of breath, wheezing 10/04/18   Fulp, Cammie, MD  isosorbide-hydrALAZINE (BIDIL) 20-37.5 MG tablet Place 0.5 tablets into feeding tube 3 (three) times daily for 30 days. 11/07/18 12/07/18  Little Ishikawa, MD  NEEDLE, DISP, 26 G 26G X 1/2" MISC 1 application by Does not apply route daily. 06/22/18   Love, Ivan Anchors, PA-C  omeprazole (PRILOSEC) 40 MG capsule Take 1 capsule (40 mg total) by mouth daily. 10/04/18   Fulp, Cammie, MD  polyethylene glycol (MIRALAX) 17 g packet Take 17 g by mouth every Monday, Wednesday, and Friday. 10/04/18   Fulp, Cammie, MD  potassium chloride SA (K-DUR) 20 MEQ tablet Take 1 tablet (20 mEq total) by mouth daily for 30 days. 11/07/18 12/07/18  Little Ishikawa, MD  rosuvastatin (CRESTOR) 40 MG tablet  Take 1 tablet (40 mg total) by mouth daily at 6 PM. 10/04/18   Fulp, Cammie, MD  senna-docusate (SENOKOT-S) 8.6-50 MG tablet Take 1 tablet by mouth 2 (two) times daily. If needed for constipation 10/04/18   Fulp, Cammie, MD  spironolactone (ALDACTONE) 25 MG tablet Take 0.5 tablets (12.5 mg total) by mouth daily for 30 days. 11/07/18 12/07/18  Little Ishikawa, MD  torsemide (DEMADEX) 20 MG tablet Take 2 tablets (40 mg total) by mouth daily for 30 days. 11/07/18 12/07/18  Little Ishikawa, MD  umeclidinium-vilanterol (ANORO ELLIPTA) 62.5-25 MCG/INH AEPB Inhale 1 puff into the lungs daily.    [provider]    Physical Exam: Vitals:   11/17/18 2030 11/17/18 2045 11/17/18 2100 11/17/18 2115  BP: 114/65 125/64 120/68 113/61  Pulse: 64 66 65 63  Resp: 18 18 17  (!) 22  Temp:      TempSrc:      SpO2: 100% 100% 96% 97%      Constitutional: Morbidly obese, acutely ill looking, Vitals:   11/17/18 2030 11/17/18 2045 11/17/18 2100 11/17/18 2115  BP: 114/65 125/64 120/68 113/61  Pulse: 64 66 65 63  Resp: 18 18 17  (!) 22   Temp:      TempSrc:      SpO2: 100% 100% 96% 97%   Eyes: PERRL, lids and conjunctivae normal ENMT: Mucous membranes are moist. Posterior pharynx clear of any exudate or lesions.Normal dentition.  Neck: normal, supple, no masses, no thyromegaly, JVD up to the angle of the jaw Respiratory: Decreased air entry bilaterally, marked crackles bilaterally, increased respiratory effort. No accessory muscle use.  Cardiovascular: Sinus tachycardia with systolic ejection murmur/ rubs / gallops. 2+ extremity edema. 2+ pedal pulses. No carotid bruits.  Abdomen: no tenderness, no masses palpated. No hepatosplenomegaly. Bowel sounds positive.  Musculoskeletal: no clubbing / cyanosis. No joint deformity upper and lower extremities. Good ROM, no contractures. Normal muscle tone.  Skin: no rashes, lesions, ulcers. No induration Neurologic: CN 2-12 grossly intact. Sensation intact, DTR normal. Strength 5/5 in all 4.  Psychiatric: Normal judgment and insight. Alert and oriented x 3. Normal mood.     Labs on Admission: I have personally reviewed following labs and imaging studies  CBC: Recent Labs  Lab 11/17/18 1655  WBC 7.9  HGB 10.1*  HCT 32.7*  MCV 84.7  PLT 811   Basic Metabolic Panel: Recent Labs  Lab 11/17/18 1655  NA 138  K 3.6  CL 102  CO2 26  GLUCOSE 236*  BUN 48*  CREATININE 2.01*  CALCIUM 8.8*   GFR: Estimated Creatinine Clearance: 43.3 mL/min (A) (by C-G formula based on SCr of 2.01 mg/dL (H)). Liver Function Tests: No results for input(s): AST, ALT, ALKPHOS, BILITOT, PROT, ALBUMIN in the last 168 hours. No results for input(s): LIPASE, AMYLASE in the last 168 hours. No results for input(s): AMMONIA in the last 168 hours. Coagulation Profile: No results for input(s): INR, PROTIME in the last 168 hours. Cardiac Enzymes: No results for input(s): CKTOTAL, CKMB, CKMBINDEX, TROPONINI in the last 168 hours. BNP (last 3 results) No results for input(s): PROBNP in the last 8760  hours. HbA1C: No results for input(s): HGBA1C in the last 72 hours. CBG: No results for input(s): GLUCAP in the last 168 hours. Lipid Profile: No results for input(s): CHOL, HDL, LDLCALC, TRIG, CHOLHDL, LDLDIRECT in the last 72 hours. Thyroid Function Tests: No results for input(s): TSH, T4TOTAL, FREET4, T3FREE, THYROIDAB in the last 72 hours. Anemia Panel: No  results for input(s): VITAMINB12, FOLATE, FERRITIN, TIBC, IRON, RETICCTPCT in the last 72 hours. Urine analysis:    Component Value Date/Time   COLORURINE YELLOW 10/28/2018 1003   APPEARANCEUR HAZY (A) 10/28/2018 1003   LABSPEC 1.008 10/28/2018 1003   PHURINE 5.0 10/28/2018 1003   GLUCOSEU NEGATIVE 10/28/2018 1003   HGBUR NEGATIVE 10/28/2018 1003   [place] 10/28/2018 1003   KETONESUR NEGATIVE 10/28/2018 1003   PROTEINUR NEGATIVE 10/28/2018 1003   UROBILINOGEN 2.0 (H) 02/21/2015 1914   NITRITE NEGATIVE 10/28/2018 1003   LEUKOCYTESUR NEGATIVE 10/28/2018 1003   Sepsis Labs: @LABRCNTIP (procalcitonin:4,lacticidven:4) )No results found for this or any previous visit (from the past 240 hour(s)).   Radiological Exams on Admission: Dg Chest 2 View  Result Date: 11/17/2018 CLINICAL DATA:  Heart failure. EXAM: CHEST - 2 VIEW COMPARISON:  10/31/2018 FINDINGS: Cardiomediastinal silhouette is enlarged. Mediastinal contours appear intact. There is no evidence of focal airspace consolidation, pleural effusion or pneumothorax. Chronic scarring in the left mid lung field. Interstitial pulmonary edema. Osseous structures are without acute abnormality. Soft tissues are grossly normal. IMPRESSION: 1. Enlarged cardiac silhouette. 2. Interstitial pulmonary edema. Electronically Signed   By: Fidela Salisbury M.D.   On: 11/17/2018 17:52    EKG: Independently reviewed.  Shows sinus rhythm with a rate of 66.  Low voltage EKG, prolonged QT interval and evidence of prior infarct.  EKG relatively unchanged from previous.   Assessment/Plan Principal Problem:   Acute on chronic combined systolic (congestive) and diastolic (congestive) heart failure (HCC) Active Problems:   Chronic anticoagulation   Chronic pulmonary embolism (HCC)   Bipolar disorder (HCC)   Essential hypertension   Morbid obesity (HCC)   CKD (chronic kidney disease), stage II   COPD GOLD III    Uncontrolled type 2 diabetes mellitus without complication, with long-term current use of insulin (HCC)   Obesity hypoventilation syndrome (HCC)   Tobacco abuse disorder   On home O2   Morbid obesity with BMI of 60.0-69.9, adult ([place])   CHF exacerbation (HCC)     #1 acute on chronic combined CHF exacerbation: Patient has both systolic and diastolics CHF with EF of 30 to 35%.  She appears to have exacerbation probably due to some dietary noncompliance.  Patient denied noncompliance.  At this point will admit and switch Lasix to IV route.  Aggressively diurese and transition to oral.  Continue other cardiac medications.  #2 bipolar disorder: Counseling provided.  Continue home regimen.  #3 diabetes: Patient on insulin at home.  Continue with sliding scale insulin.  #4 COPD: No evidence of exacerbation.  Continue empiric treatment.  No nebulizer.  #5 morbid obesity: Dietary counseling.  Continue close monitoring.  #6 GERD: Continue with PPIs.  #7 tobacco abuse: Nicotine patch.  Tobacco cessation counseling  #8 chronic kidney disease stage III: Appears to be at baseline.  Continue treatment.   DVT prophylaxis: Heparin Code Status: Full code Family Communication: No family at bedside discussed care with the patient Disposition Plan: Home Consults called: None Admission status: Inpatient  Severity of Illness: The appropriate patient status for this patient is INPATIENT. Inpatient status is judged to be reasonable and necessary in order to provide the required intensity of service to ensure the patient's safety. The patient's presenting  symptoms, physical exam findings, and initial radiographic and laboratory data in the context of their chronic comorbidities is felt to place them at high risk for further clinical deterioration. Furthermore, it is not anticipated that the patient will be medically stable  for discharge from the hospital within 2 midnights of admission. The following factors support the patient status of inpatient.   " The patient's presenting symptoms include shortness of breath and cough. " The worrisome physical exam findings include generalized swelling and anasarca. " The initial radiographic and laboratory data are worrisome because of evidence of CHF. " The chronic co-morbidities include CHF with diabetes.   * I certify that at the point of admission it is my clinical judgment that the patient will require inpatient hospital care spanning beyond 2 midnights from the point of admission due to high intensity of service, high risk for further deterioration and high frequency of surveillance required.Barbette Merino MD [facility] Pager [phone]  If 7PM-7AM, please contact night-coverage [email or web address] Password [facility]  11/17/2018, 9:58 PM

## 2018-11-18 LAB — GLUCOSE, CAPILLARY
Glucose-Capillary: 353 mg/dL — ABNORMAL HIGH (ref 70–99)
Glucose-Capillary: 325 mg/dL — ABNORMAL HIGH (ref 70–99)
Glucose-Capillary: 332 mg/dL — ABNORMAL HIGH (ref 70–99)
Glucose-Capillary: 139 mg/dL — ABNORMAL HIGH (ref 70–99)

## 2018-11-18 MED ORDER — POLYETHYLENE GLYCOL 3350 17 G PO PACK: 17.0000 g | PACK | Freq: Every day | ORAL | Status: DC | PRN

## 2018-11-18 MED ORDER — METOLAZONE 5 MG PO TABS
5.0000 mg | ORAL_TABLET | Freq: Once | ORAL | Status: AC
  Administered 2018-11-18: 5 mg via ORAL
  Filled 2018-11-18: qty 1

## 2018-11-18 MED ORDER — SENNOSIDES-DOCUSATE SODIUM 8.6-50 MG PO TABS: 2.0000 | ORAL_TABLET | Freq: Every evening | ORAL | Status: DC | PRN

## 2018-11-18 MED ORDER — FUROSEMIDE 10 MG/ML IJ SOLN
40.0000 mg | Freq: Three times a day (TID) | INTRAMUSCULAR | Status: DC
  Administered 2018-11-18 – 2018-11-20 (×6): 40 mg via INTRAVENOUS
  Filled 2018-11-18 (×6): qty 4

## 2018-11-18 MED ORDER — POTASSIUM CHLORIDE CRYS ER 20 MEQ PO TBCR
40.0000 meq | EXTENDED_RELEASE_TABLET | Freq: Once | ORAL | Status: AC
  Administered 2018-11-18: 40 meq via ORAL
  Filled 2018-11-18: qty 2

## 2018-11-18 MED ORDER — NICOTINE 21 MG/24HR TD PT24
21.0000 mg | MEDICATED_PATCH | Freq: Every day | TRANSDERMAL | Status: DC
  Administered 2018-11-20: 21 mg via TRANSDERMAL
  Filled 2018-11-18 (×2): qty 1

## 2018-11-18 MED ORDER — CARVEDILOL 3.125 MG PO TABS
3.1250 mg | ORAL_TABLET | Freq: Two times a day (BID) | ORAL | Status: DC
  Administered 2018-11-18 – 2018-11-20 (×6): 3.125 mg via ORAL
  Filled 2018-11-18 (×6): qty 1

## 2018-11-18 NOTE — Progress Notes (Signed)
PT Cancellation Note  Patient Details Name: Tamara Silva MRN: [id number] DOB: [birth year]   Cancelled Treatment:    Reason Eval/Treat Not Completed: Other (comment). Refused visit stating she was not comfortable getting up due to effects of lasix.  Agreed to therapy in AM and reports she prefers an early time.   Ramond Dial 11/18/2018, 4:11 PM  Mee Hives, PT MS Acute Rehab Dept. Number: [place]

## 2018-11-18 NOTE — Progress Notes (Signed)
PROGRESS NOTE    Tamara Silva  VCB:[id number] DOB: [birth year] DOA: 11/17/2018 PCP: Antony Blackbird, MD   Brief Narrative:  58 year old with history of systolic CHF ejection fraction 30-35%, CAD, iron deficiency anemia, morbid obesity, obesity hypoventilation syndrome, essential hypertension, seizure disorder came to the hospital with progressive shortness of breath and lower extremity swelling.  She was diagnosed with CHF exacerbation and admitted to the hospital.   Assessment & Plan:   Principal Problem:   Acute on chronic combined systolic (congestive) and diastolic (congestive) heart failure (HCC) Active Problems:   Chronic anticoagulation   Chronic pulmonary embolism (HCC)   Bipolar disorder (HCC)   Essential hypertension   Morbid obesity (HCC)   CKD (chronic kidney disease), stage II   COPD GOLD III    Uncontrolled type 2 diabetes mellitus without complication, with long-term current use of insulin (HCC)   Obesity hypoventilation syndrome (HCC)   Tobacco abuse disorder   On home O2   Morbid obesity with BMI of 60.0-69.9, adult (HCC)   CHF exacerbation (HCC)   Acute on chronic combined systolic and diastolic congestive heart failure, ejection fraction 30-35%, class III -Clinical signs of volume overload.  Increase Lasix 40 mg IV every 8 hours, 1 dose of Zaroxolyn today. -Monitor daily weight, input and output. -Fluid restriction 1800 cc. -Continue home cardiac medications including Coreg, BiDil and Aldactone.  Chronic hypoxic respiratory failure on 4 L nasal cannula Morbid obesity with chronic hypoventilation syndrome -CPAP at rest.  Supportive care.  Bronchodilators PRN.  Acute kidney injury on CKD stage III -Suspect from fluid overload.  Baseline is around 1.7.  Has trended down to 2.0 with diuretics.  Continue to monitor this.  Diabetes mellitus type 2, insulin-dependent -Insulin sliding scale Accu-Chek.  Levemir 12 units at bedtime.  Iron deficiency anemia along  with B12 deficiency and chronic disease -Continue home medications.  History of ischemic CVA complicated by seizures -On aspirin and statin.  Coronary artery disease status post stenting -Continue home meds.  GERD -PPI  Tobacco use, daily -Nicotine patch.   DVT prophylaxis: Subcu heparin Code Status: Full code Family Communication: None at bedside Disposition Plan: Maintain hospital stay for aggressive IV diuresis.  Poor oral absorption secondary to bowel edema.  Consultants:   None  Procedures:   None  Antimicrobials:   None   Subjective: Still exertional shortness of breath and bilateral lower extremity swelling.  Mild nausea.  Review of Systems Otherwise negative except as per HPI, including: General: Denies fever, chills, night sweats or unintended weight loss. Resp: Denies cough, wheezing Cardiac: Denies chest pain, palpitations, orthopnea, paroxysmal nocturnal dyspnea. GI: Denies abdominal pain, vomiting, diarrhea or constipation GU: Denies dysuria, frequency, hesitancy or incontinence MS: Denies muscle aches, joint pain or swelling Neuro: Denies headache, neurologic deficits (focal weakness, numbness, tingling), abnormal gait Psych: Denies anxiety, depression, SI/HI/AVH Skin: Denies new rashes or lesions ID: Denies sick contacts, exotic exposures, travel  Objective: Vitals:   11/18/18 0500 11/18/18 0506 11/18/18 0732 11/18/18 0822  BP:  (!) 141/39  120/60  Pulse:  69  69  Resp:  20    Temp:  97.8 F (36.6 C)  97.9 F (36.6 C)  TempSrc:  Oral  Oral  SpO2:  99% 96% 100%  Weight: 124.6 kg     Height:        Intake/Output Summary (Last 24 hours) at 11/18/2018 1024 Last data filed at 11/18/2018 0806 Gross per 24 hour  Intake 880 ml  Output 800 ml  Net  80 ml   Filed Weights   11/17/18 2236 11/18/18 0500  Weight: 124.6 kg 124.6 kg    Examination:  General exam: Appears calm and comfortable, 4 L nasal cannula Respiratory system: Diffuse  bilateral diminished breath sounds Cardiovascular system: S1 & S2 heard, RRR. No JVD, murmurs, rubs, gallops or clicks.  3+ bilateral lower extremity swelling Gastrointestinal system: Abdomen is nondistended, soft and nontender. No organomegaly or masses felt. Normal bowel sounds heard. Central nervous system: Alert and oriented. No focal neurological deficits. Extremities: Symmetric 5 x 5 power. Skin: No rashes, lesions or ulcers Psychiatry: Judgement and insight appear normal. Mood & affect appropriate.     Data Reviewed:   CBC: Recent Labs  Lab 11/17/18 1655  WBC 7.9  HGB 10.1*  HCT 32.7*  MCV 84.7  PLT 403   Basic Metabolic Panel: Recent Labs  Lab 11/17/18 1655  NA 138  K 3.6  CL 102  CO2 26  GLUCOSE 236*  BUN 48*  CREATININE 2.01*  CALCIUM 8.8*   GFR: Estimated Creatinine Clearance: 41.3 mL/min (A) (by C-G formula based on SCr of 2.01 mg/dL (H)). Liver Function Tests: No results for input(s): AST, ALT, ALKPHOS, BILITOT, PROT, ALBUMIN in the last 168 hours. No results for input(s): LIPASE, AMYLASE in the last 168 hours. No results for input(s): AMMONIA in the last 168 hours. Coagulation Profile: No results for input(s): INR, PROTIME in the last 168 hours. Cardiac Enzymes: Recent Labs  Lab 11/17/18 2127  TROPONINI <0.03   BNP (last 3 results) No results for input(s): PROBNP in the last 8760 hours. HbA1C: No results for input(s): HGBA1C in the last 72 hours. CBG: Recent Labs  Lab 11/17/18 2343 11/18/18 0615  GLUCAP 255* 353*   Lipid Profile: No results for input(s): CHOL, HDL, LDLCALC, TRIG, CHOLHDL, LDLDIRECT in the last 72 hours. Thyroid Function Tests: No results for input(s): TSH, T4TOTAL, FREET4, T3FREE, THYROIDAB in the last 72 hours. Anemia Panel: No results for input(s): VITAMINB12, FOLATE, FERRITIN, TIBC, IRON, RETICCTPCT in the last 72 hours. Sepsis Labs: No results for input(s): PROCALCITON, LATICACIDVEN in the last 168 hours.  No  results found for this or any previous visit (from the past 240 hour(s)).       Radiology Studies: Dg Chest 2 View  Result Date: 11/17/2018 CLINICAL DATA:  Heart failure. EXAM: CHEST - 2 VIEW COMPARISON:  10/31/2018 FINDINGS: Cardiomediastinal silhouette is enlarged. Mediastinal contours appear intact. There is no evidence of focal airspace consolidation, pleural effusion or pneumothorax. Chronic scarring in the left mid lung field. Interstitial pulmonary edema. Osseous structures are without acute abnormality. Soft tissues are grossly normal. IMPRESSION: 1. Enlarged cardiac silhouette. 2. Interstitial pulmonary edema. Electronically Signed   By: Fidela Salisbury M.D.   On: 11/17/2018 17:52        Scheduled Meds:  aspirin  81 mg Oral Daily   carvedilol  3.125 mg Oral BID WC   ferrous gluconate  324 mg Oral Q breakfast   furosemide  40 mg Intravenous BID   heparin  5,000 Units Subcutaneous Q8H   insulin aspart  0-20 Units Subcutaneous TID WC   insulin aspart  0-5 Units Subcutaneous QHS   insulin detemir  12 Units Subcutaneous QHS   isosorbide-hydrALAZINE  0.5 tablet Per Tube TID   nicotine  21 mg Transdermal Daily   pantoprazole  40 mg Oral Daily   [START ON 11/20/2018] polyethylene glycol  17 g Oral Q M,W,F   potassium chloride SA  20 mEq Oral  Daily   Ensure Max Protein  11 oz Oral BID BM   rosuvastatin  40 mg Oral q1800   senna-docusate  1 tablet Oral BID   sodium chloride flush  3 mL Intravenous Once   sodium chloride flush  3 mL Intravenous Q12H   spironolactone  12.5 mg Oral Daily   torsemide  40 mg Oral Daily   umeclidinium-vilanterol  1 puff Inhalation Daily   Continuous Infusions:  sodium chloride       LOS: 1 day   Time spent= 35 mins    Antoinette Borgwardt Arsenio Loader, MD [facility]  If 7PM-7AM, please contact night-coverage [email or web address] 11/18/2018, 10:24 AM

## 2018-11-18 NOTE — Plan of Care (Signed)

## 2018-11-18 NOTE — Progress Notes (Signed)
Patient having bigeminy on the monitor, pt. Asymptomatic, v/s stable,report given to night shift RN. Will continue to monitor the patient.

## 2018-11-19 LAB — GLUCOSE, CAPILLARY
Glucose-Capillary: 127 mg/dL — ABNORMAL HIGH (ref 70–99)
Glucose-Capillary: 152 mg/dL — ABNORMAL HIGH (ref 70–99)
Glucose-Capillary: 160 mg/dL — ABNORMAL HIGH (ref 70–99)
Glucose-Capillary: 181 mg/dL — ABNORMAL HIGH (ref 70–99)
Glucose-Capillary: 241 mg/dL — ABNORMAL HIGH (ref 70–99)

## 2018-11-19 LAB — BASIC METABOLIC PANEL
Anion gap: 12 (ref 5–15)
BUN: 49 mg/dL — ABNORMAL HIGH (ref 6–20)
CO2: 31 mmol/L (ref 22–32)
Calcium: 9.5 mg/dL (ref 8.9–10.3)
Chloride: 94 mmol/L — ABNORMAL LOW (ref 98–111)
Creatinine, Ser: 1.84 mg/dL — ABNORMAL HIGH (ref 0.44–1.00)
GFR calc Af Amer: 34 mL/min — ABNORMAL LOW (ref >60)
GFR calc non Af Amer: 29 mL/min — ABNORMAL LOW (ref >60)
Glucose, Bld: 200 mg/dL — ABNORMAL HIGH (ref 70–99)
Potassium: 3.3 mmol/L — ABNORMAL LOW (ref 3.5–5.1)
Sodium: 137 mmol/L (ref 135–145)

## 2018-11-19 LAB — NOVEL CORONAVIRUS, NAA (HOSP ORDER, SEND-OUT TO REF LAB; TAT 18-24 HRS): SARS-CoV-2, NAA: NOT DETECTED

## 2018-11-19 LAB — MAGNESIUM: Magnesium: 1.6 mg/dL — ABNORMAL LOW (ref 1.7–2.4)

## 2018-11-19 MED ORDER — MAGNESIUM OXIDE 400 (241.3 MG) MG PO TABS
800.0000 mg | ORAL_TABLET | ORAL | Status: AC
  Administered 2018-11-19 (×2): 800 mg via ORAL
  Filled 2018-11-19 (×2): qty 2

## 2018-11-19 MED ORDER — POTASSIUM CHLORIDE CRYS ER 20 MEQ PO TBCR
40.0000 meq | EXTENDED_RELEASE_TABLET | Freq: Once | ORAL | Status: AC
  Administered 2018-11-19: 40 meq via ORAL
  Filled 2018-11-19: qty 2

## 2018-11-19 NOTE — Evaluation (Signed)
Physical Therapy Evaluation Patient Details Name: Tamara Silva MRN: [id number] DOB: 16-Sep-1960 Today's Date: 11/19/2018   History of Present Illness  Patient is a 58 y/o female who presents with swelling in BLEs and SOB. Admitted with acute on chronic CHF exacerbation and AKI secondary to fluid overload. PMH includes recent CVA with seizure, EF 40-45%, DM, CKD, CAD, obesity.  Clinical Impression  Patient presents with lethargy, generalized weakness, deconditioning, decreased activity tolerance, dyspnea on exertion and impaired mobility s/p above. Pt requires encouragement to participate in mobility. PTA, pt requires some assist with LB ADLs and uses RW vs w/c for ambulation. Today, pt tolerated transfers and gait training with close min guard for balance/safety. Noted to have some mumbling like speech and not making full sense some of the time when answering questions. Rn present and aware. Sp02 dropped to 80% on 4L/min 02. Cues provided for pursed lip breathing. Encouraged walking with nursing daily. Will follow acutely to maximize independence and mobility prior to return home.    Follow Up Recommendations Home health PT;Supervision/Assistance - 24 hour    Equipment Recommendations  None recommended by PT    Recommendations for Other Services       Precautions / Restrictions Precautions Precautions: Fall Precaution Comments: watch 02 Restrictions Weight Bearing Restrictions: No      Mobility  Bed Mobility Overal bed mobility: Needs Assistance Bed Mobility: Supine to Sit     Supine to sit: Min guard;HOB elevated     General bed mobility comments: Able to get to EOB with use of rail, HOB elevated but no physical assist. Requires some encouragement.  Transfers Overall transfer level: Needs assistance Equipment used: Rolling walker (2 wheeled) Transfers: Sit to/from Stand Sit to Stand: Min guard         General transfer comment: cueing for hand placement and safety, min  guard for safety. Stood from [employer], transferred to chair post ambulation.  Ambulation/Gait Ambulation/Gait assistance: Min guard Gait Distance (Feet): 120 Feet Assistive device: Rolling walker (2 wheeled) Gait Pattern/deviations: Step-through pattern;Decreased stride length;Trunk flexed Gait velocity: decreased initially, fast pace to return to room, unsafe   General Gait Details: Slow, mildly unsteady gait which worsened towards end of distance requiring hands on assist to maintain balance; Sp02 dropped to 80% on 4L/min 02. 2/4 DOE.  Stairs            Wheelchair Mobility    Modified Rankin (Stroke Patients Only)       Balance Overall balance assessment: Needs assistance Sitting-balance support: No upper extremity supported;Feet supported Sitting balance-Leahy Scale: Good Sitting balance - Comments: Able to donn right sock but not left sock needing total A   Standing balance support: Bilateral upper extremity supported;During functional activity Standing balance-Leahy Scale: Fair Standing balance comment: reliant on BUE support during ambulation but once in room, ditches RW and holds onto furniture to get to chair.Worried                             Pertinent Vitals/Pain Pain Assessment: Faces Faces Pain Scale: No hurt    Home Living Family/patient expects to be discharged to:: Private residence Living Arrangements: Spouse/significant other;Children Available Help at Discharge: Family;Available 24 hours/day Type of Home: House Home Access: Stairs to enter Entrance Stairs-Rails: None Entrance Stairs-Number of Steps: 2 Home Layout: One level Home Equipment: Cane - single point;Bedside commode;Walker - 2 wheels      Prior Function Level of Independence: Needs assistance  Gait / Transfers Assistance Needed: Ambulates short distances/household distances with RW and w/c on the porch and in the community  ADL's / Homemaking Assistance Needed: modified  independent with UB ADL and requiring assist for LB ADL        Hand Dominance   Dominant Hand: Right    Extremity/Trunk Assessment   Upper Extremity Assessment Upper Extremity Assessment: Defer to OT evaluation RUE Deficits / Details: s/p CVA residual weakness    Lower Extremity Assessment Lower Extremity Assessment: Generalized weakness    Cervical / Trunk Assessment Cervical / Trunk Assessment: Other exceptions Cervical / Trunk Exceptions: obesity, flexed posture  Communication   Communication: Expressive difficulties  Cognition Arousal/Alertness: Lethargic Behavior During Therapy: WFL for tasks assessed/performed Overall Cognitive Status: Impaired/Different from baseline Area of Impairment: Attention;Following commands                   Current Attention Level: Sustained   Following Commands: Follows multi-step commands inconsistently;Follows one step commands with increased time       General Comments: A&Ox3; slow to respond to questions. Easily distracted. Reports feeling tired.      General Comments General comments (skin integrity, edema, etc.): Worried about getting up due to having to urinate so frequently due to meds.    Exercises     Assessment/Plan    PT Assessment Patient needs continued PT services  PT Problem List Decreased activity tolerance;Decreased strength;Cardiopulmonary status limiting activity;Decreased mobility;Obesity;Decreased balance;Decreased cognition       PT Treatment Interventions Gait training;Stair training;Functional mobility training;Therapeutic activities;Patient/family education;Therapeutic exercise;Balance training    PT Goals (Current goals can be found in the Care Plan section)  Acute Rehab PT Goals Patient Stated Goal: to feel better PT Goal Formulation: With patient Time For Goal Achievement: 12/03/18 Potential to Achieve Goals: Good    Frequency Min 3X/week   Barriers to discharge         Co-evaluation               AM-PAC PT "6 Clicks" Mobility  Outcome Measure Help needed turning from your back to your side while in a flat bed without using bedrails?: A Little Help needed moving from lying on your back to sitting on the side of a flat bed without using bedrails?: A Little Help needed moving to and from a bed to a chair (including a wheelchair)?: A Little Help needed standing up from a chair using your arms (e.g., wheelchair or bedside chair)?: A Little Help needed to walk in hospital room?: A Little Help needed climbing 3-5 steps with a railing? : A Lot 6 Click Score: 17    End of Session Equipment Utilized During Treatment: Gait belt;Oxygen Activity Tolerance: Treatment limited secondary to medical complications (Comment)(drop in Sp02) Patient left: in chair;with call bell/phone within reach;with nursing/sitter in room Nurse Communication: Mobility status PT Visit Diagnosis: Other abnormalities of gait and mobility (R26.89);Muscle weakness (generalized) (M62.81);Difficulty in walking, not elsewhere classified (R26.2)    Time: 8413-2440 PT Time Calculation (min) (ACUTE ONLY): 23 min   Charges:   PT Evaluation $PT Eval Moderate Complexity: 1 Mod PT Treatments $Gait Training: 8-22 mins        Wray Kearns, [place], DPT Acute Rehabilitation Services Pager [phone] Office [place] 11/19/2018, 12:45 PM

## 2018-11-19 NOTE — Progress Notes (Signed)
PROGRESS NOTE    Tamara Silva  OEV:[id number] DOB: 1960/10/23 DOA: 11/17/2018 PCP: Antony Blackbird, MD   Brief Narrative:  58 year old with history of systolic CHF ejection fraction 30-35%, CAD, iron deficiency anemia, morbid obesity, obesity hypoventilation syndrome, essential hypertension, seizure disorder came to the hospital with progressive shortness of breath and lower extremity swelling.  She was diagnosed with CHF exacerbation and admitted to the hospital.  Acute kidney injury secondary to fluid overload, improving with diuresis.   Assessment & Plan:   Principal Problem:   Acute on chronic combined systolic (congestive) and diastolic (congestive) heart failure (HCC) Active Problems:   Chronic anticoagulation   Chronic pulmonary embolism (HCC)   Bipolar disorder (HCC)   Essential hypertension   Morbid obesity (HCC)   CKD (chronic kidney disease), stage II   COPD GOLD III    Uncontrolled type 2 diabetes mellitus without complication, with long-term current use of insulin (HCC)   Obesity hypoventilation syndrome (HCC)   Tobacco abuse disorder   On home O2   Morbid obesity with BMI of 60.0-69.9, adult (HCC)   CHF exacerbation (HCC)   Acute on chronic combined systolic and diastolic congestive heart failure, ejection fraction 30-35%, class III -Clinical signs of volume overload.  Continue Lasix 40 mg every 8 hours.  Received 1 dose of Zaroxolyn 6/20. -Monitor daily weight, input and output. -Fluid restriction 1800 cc. -Continue home cardiac medications including Coreg, BiDil and Aldactone.  Hypokalemia/hypomagnesemia -Repletion ordered.  Chronic hypoxic respiratory failure on 4 L nasal cannula Morbid obesity with chronic hypoventilation syndrome -CPAP at rest.  Supportive care.  Bronchodilators PRN.  Acute kidney injury on CKD stage III -Suspect from fluid overload.  Baseline is around 1.7.  Continues to trend down with diuresis.  Today's 1.84.  Diabetes mellitus type  2, insulin-dependent -Insulin sliding scale Accu-Chek.  Levemir 12 units at bedtime.  Slightly elevated today but it is only 1 reading.  Hold off on further adjustment today  Iron deficiency anemia along with B12 deficiency and chronic disease -Continue home medications.  History of ischemic CVA complicated by seizures -On aspirin and statin.  Coronary artery disease status post stenting -Continue home meds.  GERD -PPI  Tobacco use, daily -Nicotine patch.   DVT prophylaxis: Subcu heparin Code Status: Full code Family Communication: None at bedside Disposition Plan: Patient still quite volume overloaded, continue aggressive IV diuresis.  Poor oral obstruction due to bowel edema.  Consultants:   None  Procedures:   None  Antimicrobials:   None   Subjective: Mild exertional shortness of breath and bilateral lower extremity swelling.  Overall feels though better than yesterday. Review of Systems Otherwise negative except as per HPI, including: General = no fevers, chills, dizziness, malaise, fatigue HEENT/EYES = negative for pain, redness, loss of vision, double vision, blurred vision, loss of hearing, sore throat, hoarseness, dysphagia Cardiovascular= negative for chest pain, palpitation, murmurs, lower extremity swelling Respiratory/lungs= negative forhemoptysis, wheezing, mucus production Gastrointestinal= negative for nausea, vomiting,, abdominal pain, melena, hematemesis Genitourinary= negative for Dysuria, Hematuria, Change in Urinary Frequency MSK = Negative for arthralgia, myalgias, Back Pain, Joint swelling  Neurology= Negative for headache, seizures, numbness, tingling  Psychiatry= Negative for anxiety, depression, suicidal and homocidal ideation Allergy/Immunology= Medication/Food allergy as listed  Skin= Negative for Rash, lesions, ulcers, itching   Objective: Vitals:   11/18/18 1949 11/18/18 2358 11/19/18 0528 11/19/18 1029  BP: (!) 119/58 116/60  130/63   Pulse: 68 79 76 75  Resp: 20 20 20 16   Temp: 98.1  F (36.7 C) 98.8 F (37.1 C) (!) 97.5 F (36.4 C)   TempSrc: Oral Oral    SpO2: 93% 98% 98% 97%  Weight:   120.7 kg   Height:        Intake/Output Summary (Last 24 hours) at 11/19/2018 1157 Last data filed at 11/19/2018 1151 Gross per 24 hour  Intake 1320 ml  Output 6250 ml  Net -4930 ml   Filed Weights   11/17/18 2236 11/18/18 0500 11/19/18 0528  Weight: 124.6 kg 124.6 kg 120.7 kg    Examination: Constitutional: NAD, calm, comfortable, on 4 L nasal cannula Eyes: PERRL, lids and conjunctivae normal ENMT: Mucous membranes are moist. Posterior pharynx clear of any exudate or lesions.Normal dentition.  Neck: normal, supple, no masses, no thyromegaly Respiratory: c bilateral diminished breath sounds especially at the bases Cardiovascular: Regular rate and rhythm, no murmurs / rubs / gallops.  2+ bilateral lower extremity pitting edema. 2+ pedal pulses. No carotid bruits.  Abdomen: no tenderness, no masses palpated. No hepatosplenomegaly. Bowel sounds positive.  Musculoskeletal: no clubbing / cyanosis. No joint deformity upper and lower extremities. Good ROM, no contractures. Normal muscle tone.  Skin: no rashes, lesions, ulcers. No induration Neurologic: CN 2-12 grossly intact. Sensation intact, DTR normal. Strength 5/5 in all 4.  Psychiatric: Normal judgment and insight. Alert and oriented x 3. Normal mood.     Data Reviewed:   CBC: Recent Labs  Lab 11/17/18 1655  WBC 7.9  HGB 10.1*  HCT 32.7*  MCV 84.7  PLT 751   Basic Metabolic Panel: Recent Labs  Lab 11/17/18 1655 11/19/18 0804  NA 138 137  K 3.6 3.3*  CL 102 94*  CO2 26 31  GLUCOSE 236* 200*  BUN 48* 49*  CREATININE 2.01* 1.84*  CALCIUM 8.8* 9.5  MG  --  1.6*   GFR: Estimated Creatinine Clearance: 44.3 mL/min (A) (by C-G formula based on SCr of 1.84 mg/dL (H)). Liver Function Tests: No results for input(s): AST, ALT, ALKPHOS, BILITOT, PROT,  ALBUMIN in the last 168 hours. No results for input(s): LIPASE, AMYLASE in the last 168 hours. No results for input(s): AMMONIA in the last 168 hours. Coagulation Profile: No results for input(s): INR, PROTIME in the last 168 hours. Cardiac Enzymes: Recent Labs  Lab 11/17/18 2127  TROPONINI <0.03   BNP (last 3 results) No results for input(s): PROBNP in the last 8760 hours. HbA1C: No results for input(s): HGBA1C in the last 72 hours. CBG: Recent Labs  Lab 11/18/18 1658 11/18/18 2137 11/18/18 2356 11/19/18 0616 11/19/18 1106  GLUCAP 332* 139* 127* 181* 241*   Lipid Profile: No results for input(s): CHOL, HDL, LDLCALC, TRIG, CHOLHDL, LDLDIRECT in the last 72 hours. Thyroid Function Tests: No results for input(s): TSH, T4TOTAL, FREET4, T3FREE, THYROIDAB in the last 72 hours. Anemia Panel: No results for input(s): VITAMINB12, FOLATE, FERRITIN, TIBC, IRON, RETICCTPCT in the last 72 hours. Sepsis Labs: No results for input(s): PROCALCITON, LATICACIDVEN in the last 168 hours.  No results found for this or any previous visit (from the past 240 hour(s)).       Radiology Studies: Dg Chest 2 View  Result Date: 11/17/2018 CLINICAL DATA:  Heart failure. EXAM: CHEST - 2 VIEW COMPARISON:  10/31/2018 FINDINGS: Cardiomediastinal silhouette is enlarged. Mediastinal contours appear intact. There is no evidence of focal airspace consolidation, pleural effusion or pneumothorax. Chronic scarring in the left mid lung field. Interstitial pulmonary edema. Osseous structures are without acute abnormality. Soft tissues are grossly normal. IMPRESSION:  1. Enlarged cardiac silhouette. 2. Interstitial pulmonary edema. Electronically Signed   By: Fidela Salisbury M.D.   On: 11/17/2018 17:52        Scheduled Meds: . aspirin  81 mg Oral Daily  . carvedilol  3.125 mg Oral BID WC  . ferrous gluconate  324 mg Oral Q breakfast  . furosemide  40 mg Intravenous Q8H  . heparin  5,000 Units  Subcutaneous Q8H  . insulin aspart  0-20 Units Subcutaneous TID WC  . insulin aspart  0-5 Units Subcutaneous QHS  . insulin detemir  12 Units Subcutaneous QHS  . isosorbide-hydrALAZINE  0.5 tablet Per Tube TID  . magnesium oxide  800 mg Oral Q4H  . nicotine  21 mg Transdermal Daily  . pantoprazole  40 mg Oral Daily  . [START ON 11/20/2018] polyethylene glycol  17 g Oral Q M,W,F  . potassium chloride SA  20 mEq Oral Daily  . potassium chloride  40 mEq Oral Once  . Ensure Max Protein  11 oz Oral BID BM  . rosuvastatin  40 mg Oral q1800  . senna-docusate  1 tablet Oral BID  . sodium chloride flush  3 mL Intravenous Once  . sodium chloride flush  3 mL Intravenous Q12H  . spironolactone  12.5 mg Oral Daily  . umeclidinium-vilanterol  1 puff Inhalation Daily   Continuous Infusions: . sodium chloride       LOS: 2 days   Time spent= 35 mins    Chesley Valls Arsenio Loader, MD [facility]  If 7PM-7AM, please contact night-coverage [email or web address] 11/19/2018, 11:57 AM

## 2018-11-20 ENCOUNTER — Inpatient Hospital Stay: Payer: Medicaid Other | Admitting: Family Medicine

## 2018-11-20 DIAGNOSIS — Z794 Long term (current) use of insulin: Secondary | ICD-10-CM

## 2018-11-20 DIAGNOSIS — E1165 Type 2 diabetes mellitus with hyperglycemia: Secondary | ICD-10-CM

## 2018-11-20 DIAGNOSIS — E662 Morbid (severe) obesity with alveolar hypoventilation: Secondary | ICD-10-CM

## 2018-11-20 DIAGNOSIS — N182 Chronic kidney disease, stage 2 (mild): Secondary | ICD-10-CM

## 2018-11-20 DIAGNOSIS — Z6841 Body Mass Index (BMI) 40.0 and over, adult: Secondary | ICD-10-CM

## 2018-11-20 DIAGNOSIS — I10 Essential (primary) hypertension: Secondary | ICD-10-CM

## 2018-11-20 DIAGNOSIS — Z7901 Long term (current) use of anticoagulants: Secondary | ICD-10-CM

## 2018-11-20 DIAGNOSIS — Z9981 Dependence on supplemental oxygen: Secondary | ICD-10-CM

## 2018-11-20 LAB — GLUCOSE, CAPILLARY
Glucose-Capillary: 154 mg/dL — ABNORMAL HIGH (ref 70–99)
Glucose-Capillary: 209 mg/dL — ABNORMAL HIGH (ref 70–99)
Glucose-Capillary: 192 mg/dL — ABNORMAL HIGH (ref 70–99)

## 2018-11-20 LAB — MAGNESIUM: Magnesium: 1.7 mg/dL (ref 1.7–2.4)

## 2018-11-20 LAB — BASIC METABOLIC PANEL
Anion gap: 13 (ref 5–15)
BUN: 56 mg/dL — ABNORMAL HIGH (ref 6–20)
CO2: 34 mmol/L — ABNORMAL HIGH (ref 22–32)
Calcium: 9.5 mg/dL (ref 8.9–10.3)
Chloride: 91 mmol/L — ABNORMAL LOW (ref 98–111)
Creatinine, Ser: 2.02 mg/dL — ABNORMAL HIGH (ref 0.44–1.00)
GFR calc Af Amer: 31 mL/min — ABNORMAL LOW (ref >60)
GFR calc non Af Amer: 26 mL/min — ABNORMAL LOW (ref >60)
Glucose, Bld: 144 mg/dL — ABNORMAL HIGH (ref 70–99)
Potassium: 3.3 mmol/L — ABNORMAL LOW (ref 3.5–5.1)
Sodium: 138 mmol/L (ref 135–145)

## 2018-11-20 MED ORDER — POTASSIUM CHLORIDE CRYS ER 20 MEQ PO TBCR
40.0000 meq | EXTENDED_RELEASE_TABLET | ORAL | Status: AC
  Administered 2018-11-20 (×2): 40 meq via ORAL
  Filled 2018-11-20 (×2): qty 2

## 2018-11-20 MED ORDER — SPIRONOLACTONE 25 MG PO TABS: 25.0000 mg | ORAL_TABLET | Freq: Every day | ORAL | Status: DC

## 2018-11-20 MED ORDER — TORSEMIDE 20 MG PO TABS: 40.0000 mg | ORAL_TABLET | Freq: Two times a day (BID) | ORAL | 1 refills | Status: DC

## 2018-11-20 MED ORDER — SPIRONOLACTONE 25 MG PO TABS: 25.0000 mg | ORAL_TABLET | Freq: Every day | ORAL | 2 refills | Status: DC

## 2018-11-20 MED ORDER — POTASSIUM CHLORIDE CRYS ER 20 MEQ PO TBCR: 40.0000 meq | EXTENDED_RELEASE_TABLET | Freq: Once | ORAL | Status: DC

## 2018-11-20 MED ORDER — TORSEMIDE 20 MG PO TABS: 40.0000 mg | ORAL_TABLET | Freq: Two times a day (BID) | ORAL | Status: DC

## 2018-11-20 MED ORDER — POTASSIUM CHLORIDE CRYS ER 20 MEQ PO TBCR: 40.0000 meq | EXTENDED_RELEASE_TABLET | ORAL | Status: DC

## 2018-11-20 MED ORDER — MAGNESIUM SULFATE 2 GM/50ML IV SOLN
2.0000 g | Freq: Once | INTRAVENOUS | Status: AC
  Administered 2018-11-20: 2 g via INTRAVENOUS
  Filled 2018-11-20: qty 50

## 2018-11-20 MED ORDER — FUROSEMIDE 40 MG PO TABS
40.0000 mg | ORAL_TABLET | Freq: Two times a day (BID) | ORAL | Status: DC
  Administered 2018-11-20: 40 mg via ORAL
  Filled 2018-11-20: qty 1

## 2018-11-20 NOTE — Plan of Care (Signed)
  Problem: Education: Goal: Knowledge of General Education information will improve Description Including pain rating scale, medication(s)/side effects and non-pharmacologic comfort measures Outcome: Progressing   

## 2018-11-20 NOTE — Progress Notes (Signed)
Discharge instructions given to patient,her IV was removed with catheter intact dry clean dressing applied over previous site. Her tele was removed and CCMD was informed. Oncoming shift given report they know to resolve careplan,education etc. Patient still needs to call for her ride they are aware.

## 2018-11-20 NOTE — Progress Notes (Signed)
Ambulated patient with rolling walker and o2 at 4 liters per minute N/C to nurses station and back with patient stopping 2 times to rest her walking o2 sat was 96%. When she got to the room stopped ambulating standing it was 98%. Her gait was steady with walker I was just standing by in case she needed my help she required no help from Probation officer.

## 2018-11-20 NOTE — Discharge Summary (Signed)
Physician Discharge Summary  Lizzete Gough FHL:[id number] DOB: [birth year] DOA: 11/17/2018  PCP: Antony Blackbird, MD  Admit date: 11/17/2018 Discharge date: 11/20/2018  Admitted From: Home Disposition: Home  Recommendations for Outpatient Follow-up:  1. Follow up with PCP and cardiologist in 1-2 weeks 2. Please obtain CBC/BMP/Mag at follow up 3. Please follow up on the following pending results: None  Home Health: PT/OT/RN Equipment/Devices: None  Discharge Condition: Stable CODE STATUS: Full code  Hospital Course: 58 year old with history of systolic CHF ejection fraction 30-35%, CAD, iron deficiency anemia, morbid obesity, obesity hypoventilation syndrome, essential hypertension, seizure disorder came to the hospital with progressive shortness of breath and lower extremity swelling.  She was diagnosed with CHF exacerbation and admitted to the hospital.  Acute kidney injury secondary to fluid overload, improving with diuresis.  Patient was ambulated on 4 L by nasal cannula on the day of discharge and maintained oxygen saturation in mid 90s.  See individual problem list below for more.  Discharge Diagnoses:  Acute on chronic combined systolic and diastolic congestive heart failure, ejection fraction 30-35%, class III-diuresed with IV Lasix.  Significant improvement in his symptoms.  Ambulated on home 4 L by nasal cannula and maintained oxygen saturation in mid 90s on the day of discharge. -Increased home torsemide to 40 mg twice daily -Increased home Aldactone to 25 mg daily -Counseled on fluid and salt restriction, and daily weights -Follow-up with PCP/cardiology in 1 to 2 weeks -Check renal function and magnesium at follow-up -Adjust diuretic dose based on fluid status and renal function. -Other cardiac medications including Coreg and BiDil continued.  Hypokalemia/hypomagnesemia -Replenished prior to discharge.  Chronic hypoxic respiratory failure on 4 L nasal  cannula Morbid obesity with chronic hypoventilation syndrome Chronic COPD -Continue home breathing treatments, oxygen and CPAP  Acute kidney injury on CKD stage III -Resolved with diuretics. -Recheck renal function at follow-up  IDDM-2 with renal complication -Discharged on home medications. -She could benefit from newer agents with cardiovascular benefit.  Iron deficiency anemia along with B12 deficiency and chronic disease -Continue home medications  History of ischemic CVA complicated by seizures -On aspirin and statin.  Coronary artery disease status post stenting -Continue home meds.  GERD -Continue PPI   Discharge Instructions  Discharge Instructions    (HEART FAILURE PATIENTS) Call MD:  Anytime you have any of the following symptoms: 1) 3 pound weight gain in 24 hours or 5 pounds in 1 week 2) shortness of breath, with or without a dry hacking cough 3) swelling in the hands, feet or stomach 4) if you have to sleep on extra pillows at night in order to breathe.   Complete by: As directed    Call MD for:  difficulty breathing, headache or visual disturbances   Complete by: As directed    Call MD for:  extreme fatigue   Complete by: As directed    Call MD for:  persistant nausea and vomiting   Complete by: As directed    Diet - low sodium heart healthy   Complete by: As directed    Diet Carb Modified   Complete by: As directed    Discharge instructions   Complete by: As directed    It has been a pleasure taking care of you! You were admitted with shortness of breath and leg swelling likely due to CHF exacerbation/fluid buildup.  We have treated you with fluid medication.  With that, your symptoms improved to the point we think it is safe to let you  go home and follow-up with your primary care doctor and cardiologist in 1 to 2 weeks. There are some changes to your medication during this hospitalization.  Please review medication list and the direction before you  take them.  We recommend you limit your fluid intake to 1500 cc (6 cups) a day and and sodium intake to less than 2 g (2000 mg) a day.   Please review your new medication list and directions before you take your medications.  Take care, Dr. Cyndia Skeeters   Increase activity slowly   Complete by: As directed      Allergies as of 11/20/2018      Reactions   Metolazone Other (See Comments)   Dizziness and falling   Plavix [clopidogrel Bisulfate] Other (See Comments)   High PRU's - non-responder   Ibuprofen Other (See Comments)   Pt states she is not supposed to take ibuprofen because of other meds she is taking   Nsaids Other (See Comments)   "I do not take NSAIDs d/t interfering with other meds"      Medication List    TAKE these medications   Accu-Chek Aviva Plus test strip Generic drug: glucose blood USE 1 STRIP TO CHECK GLUCOSE 3 TIMES DAILY What changed: See the new instructions.   albuterol 108 (90 Base) MCG/ACT inhaler Commonly known as: ProAir HFA INHALE 2 PUFFS BY MOUTH 4 TIMES DAILY AS NEEDED What changed:   how much to take  how to take this  when to take this  reasons to take this  additional instructions   Anoro Ellipta 62.5-25 MCG/INH Aepb Generic drug: umeclidinium-vilanterol Inhale 1 puff into the lungs daily.   aspirin 81 MG chewable tablet Chew 1 tablet (81 mg total) by mouth daily for 30 days.   carvedilol 3.125 MG tablet Commonly known as: COREG Take 1 tablet (3.125 mg total) by mouth 2 (two) times daily with a meal.   Ensure Max Protein Liqd Take 330 mLs (11 oz total) by mouth 2 (two) times daily for 30 days.   ferrous gluconate 324 MG tablet Commonly known as: FERGON Take 1 tablet (324 mg total) by mouth daily with breakfast. Office visit needed prior to future refills   hydrocerin Crea Apply 1 application topically 2 (two) times daily.   insulin detemir 100 UNIT/ML injection Commonly known as: LEVEMIR Inject 0.12 mLs (12 Units total)  into the skin at bedtime.   ipratropium-albuterol 0.5-2.5 (3) MG/3ML Soln Commonly known as: DUONEB Use nebulizer to inhale contents of one vial every 6 hours as needed for shortness of breath, wheezing   isosorbide-hydrALAZINE 20-37.5 MG tablet Commonly known as: BIDIL Place 0.5 tablets into feeding tube 3 (three) times daily for 30 days.   NEEDLE (DISP) 26 G 26G X 1/2" Misc 1 application by Does not apply route daily.   omeprazole 40 MG capsule Commonly known as: PRILOSEC Take 1 capsule (40 mg total) by mouth daily.   polyethylene glycol 17 g packet Commonly known as: MiraLax Take 17 g by mouth every Monday, Wednesday, and Friday.   potassium chloride SA 20 MEQ tablet Commonly known as: K-DUR Take 1 tablet (20 mEq total) by mouth daily for 30 days.   rosuvastatin 40 MG tablet Commonly known as: CRESTOR Take 1 tablet (40 mg total) by mouth daily at 6 PM.   senna-docusate 8.6-50 MG tablet Commonly known as: Senokot-S Take 1 tablet by mouth 2 (two) times daily. If needed for constipation   spironolactone 25 MG tablet Commonly  known as: ALDACTONE Take 1 tablet (25 mg total) by mouth daily for 30 days. What changed: how much to take   torsemide 20 MG tablet Commonly known as: DEMADEX Take 2 tablets (40 mg total) by mouth 2 (two) times daily. What changed: when to take this        Consultations:  None  Procedures/Studies:  2D Echo on 10/26/2018  1. The left ventricle has moderate-severely reduced systolic function, with an ejection fraction of 30-35%. The cavity size was normal. Left ventricular diastolic Doppler parameters are indeterminate. Left ventricular diffuse hypokinesis.  2. The right ventricle was not well visualized. The cavity was mildly enlarged. There is no increase in right ventricular wall thickness.  3. Left atrial size was mildly dilated.  4. There is mild mitral annular calcification present.  5. No intracardiac thrombi or masses were  visualized.  Dg Chest 2 View  Result Date: 11/17/2018 CLINICAL DATA:  Heart failure. EXAM: CHEST - 2 VIEW COMPARISON:  10/31/2018 FINDINGS: Cardiomediastinal silhouette is enlarged. Mediastinal contours appear intact. There is no evidence of focal airspace consolidation, pleural effusion or pneumothorax. Chronic scarring in the left mid lung field. Interstitial pulmonary edema. Osseous structures are without acute abnormality. Soft tissues are grossly normal. IMPRESSION: 1. Enlarged cardiac silhouette. 2. Interstitial pulmonary edema. Electronically Signed   By: Fidela Salisbury M.D.   On: 11/17/2018 17:52   Dg Abd 1 View  Result Date: 10/28/2018 CLINICAL DATA:  OG tube placement EXAM: ABDOMEN - 1 VIEW COMPARISON:  06/12/2018 FINDINGS: Enteric tube terminates in the proximal gastric body. IMPRESSION: Enteric tube terminates in the proximal gastric body. Electronically Signed   By: Julian Hy M.D.   On: 10/28/2018 10:39   Ct Head Wo Contrast  Result Date: 10/28/2018 CLINICAL DATA:  Post code.  Altered mental status. EXAM: CT HEAD WITHOUT CONTRAST TECHNIQUE: Contiguous axial images were obtained from the base of the skull through the vertex without intravenous contrast. COMPARISON:  06/05/2018 FINDINGS: Brain: Low-density areas in the right parietal and posterior temporal lobes compatible with old infarcts. Old right caudate head lacunar infarct, stable. No acute intracranial abnormality. Specifically, no hemorrhage, hydrocephalus, mass lesion, acute infarction, or significant intracranial injury. Vascular: No hyperdense vessel or unexpected calcification. Skull: No acute calvarial abnormality. Sinuses/Orbits: Visualized paranasal sinuses and mastoids clear. Orbital soft tissues unremarkable. 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 Old right basal ganglia lacunar infarct. No acute intracranial abnormality. Electronically Signed   By: Rolm Baptise M.D.   On:  10/28/2018 21:53   Dg Chest Port 1 View  Result Date: 10/31/2018 CLINICAL DATA:  Pulmonary edema and asthma EXAM: PORTABLE CHEST 1 VIEW COMPARISON:  Two days ago FINDINGS: Interval tracheal and esophageal extubation. There is a scar-like appearance in the peripheral left upper lobe. Stable cardiomegaly. Stable vascular congestion/edema. Right upper extremity PICC with tip at the SVC. IMPRESSION: 1. Cardiomegaly and persistent vascular congestion/edema. 2. Stable inflation after extubation. Electronically Signed   By: Monte Fantasia M.D.   On: 10/31/2018 07:29   Dg Chest Port 1 View  Result Date: 10/29/2018 CLINICAL DATA:  Cardiac arrest EXAM: PORTABLE CHEST 1 VIEW COMPARISON:  10/28/2018 FINDINGS: Mild cardiomegaly. Endotracheal tube, NG tube, right PICC are stable. Patchy and hazy airspace opacities throughout both lungs are worse. Low lung volumes. No pneumothorax. IMPRESSION: Worsening bilateral airspace disease. Stable support apparatus. Electronically Signed   By: Marybelle Killings M.D.   On: 10/29/2018 08:57   Dg Chest [facility] 1 View  Result  Date: 10/28/2018 CLINICAL DATA:  ETT placement EXAM: PORTABLE CHEST 1 VIEW COMPARISON:  10/25/2018 FINDINGS: Endotracheal tube terminates 4.5 cm above the carina. Multifocal patchy opacities, upper lobe predominant. While this patient has chronic scarring, superimposed right upper lobe pneumonia is not excluded. No definite pleural effusion. Cardiomegaly. Right arm PICC terminates in the lower SVC. IMPRESSION: Endotracheal tube terminates 4.5 cm above the carina. Right arm PICC terminates in the lower SVC. Multifocal patchy opacities, upper lobe predominant, some of which may reflect chronic scarring. Superimposed right upper lobe pneumonia is not excluded. Electronically Signed   By: Julian Hy M.D.   On: 10/28/2018 10:38   Dg Chest Port 1 View  Result Date: 10/25/2018 CLINICAL DATA:  Shortness of breath EXAM: PORTABLE CHEST 1 VIEW COMPARISON:  07/08/2018  FINDINGS: Cardiac shadow remains enlarged. Lungs are well aerated bilaterally. Chronic scarring in the left lateral lung is noted. No acute infiltrate or sizable effusion is seen. IMPRESSION: Chronic changes without acute abnormality. Electronically Signed   By: Inez Catalina M.D.   On: 10/25/2018 02:13   [place] Ekg Site Rite  Result Date: 10/27/2018 If Site Rite image not attached, placement could not be confirmed due to current cardiac rhythm.     Subjective: No major events overnight of this morning.  Reports significant improvement in her breathing and edema.  Denies chest pain, GI or GU symptoms.  She feels ready to go home.    Discharge Exam: Vitals:   11/20/18 1605 11/20/18 1812  BP: (!) 111/56   Pulse: 66 92  Resp: 16   Temp: 98.4 F (36.9 C)   SpO2: 100% 98%    GENERAL: No acute distress.  Appears well.  HEENT: MMM.  Vision and hearing grossly intact.  NECK: Supple.  No JVD.  LUNGS:  No IWOB. Good air movement bilaterally. HEART:  RRR. Heart sounds normal.  ABD: Bowel sounds present. Soft. Non tender.  MSK/EXT:  Moves all extremities. No apparent deformity. No edema bilaterally. SKIN: no apparent skin lesion or wound NEURO: Awake, alert and oriented appropriately.  No gross deficit.  PSYCH: Calm. Normal affect.     The results of significant diagnostics from this hospitalization (including imaging, microbiology, ancillary and laboratory) are listed below for reference.     Microbiology: Recent Results (from the past 240 hour(s))  Novel Coronavirus,NAA,(SEND-OUT TO REF LAB - TAT 24-48 hrs); Hosp Order     Status: None   Collection Time: 11/17/18 10:07 PM   Specimen: Nasopharyngeal Swab; Respiratory  Result Value Ref Range Status   SARS-CoV-2, NAA NOT DETECTED NOT DETECTED Final    Comment: (NOTE) This test was developed and its performance characteristics determined by [employer]. This test has not been FDA cleared or approved. This test has been  authorized by FDA under an Emergency Use Authorization (EUA). This test is only authorized for the duration of time the declaration that circumstances exist justifying the authorization of the emergency use of in vitro diagnostic tests for detection of SARS-CoV-2 virus and/or diagnosis of COVID-19 infection under section 564(b)(1) of the Act, 21 U.S.C. 694WNI-6(E)(7), unless the authorization is terminated or revoked sooner. When diagnostic testing is negative, the possibility of a false negative result should be considered in the context of a patient's recent exposures and the presence of clinical signs and symptoms consistent with COVID-19. An individual without symptoms of COVID-19 and who is not shedding SARS-CoV-2 virus would expect to have a negative (not detected) result in this assay. Performed  At: [facility]  [facility] [address] [place], [place] [id number] Rush Farmer MD PR:[id number]    Coronavirus Source NASOPHARYNGEAL  Final    Comment: Performed at [facility], [place] [address] [place], [place] [address]     Labs: BNP (last 3 results) Recent Labs    10/25/18 0309 10/28/18 1023 11/17/18 1932  BNP 709.4* 1,423.5* 177.9*   Basic Metabolic Panel: Recent Labs  Lab 11/17/18 1655 11/19/18 0804 11/20/18 0630  NA 138 137 138  K 3.6 3.3* 3.3*  CL 102 94* 91*  CO2 26 31 34*  GLUCOSE 236* 200* 144*  BUN 48* 49* 56*  CREATININE 2.01* 1.84* 2.02*  CALCIUM 8.8* 9.5 9.5  MG  --  1.6* 1.7   Liver Function Tests: No results for input(s): AST, ALT, ALKPHOS, BILITOT, PROT, ALBUMIN in the last 168 hours. No results for input(s): LIPASE, AMYLASE in the last 168 hours. No results for input(s): AMMONIA in the last 168 hours. CBC: Recent Labs  Lab 11/17/18 1655  WBC 7.9  HGB 10.1*  HCT 32.7*  MCV 84.7  PLT 296   Cardiac Enzymes: Recent Labs  Lab 11/17/18 2127  TROPONINI <0.03   BNP: Invalid input(s): POCBNP CBG: Recent Labs  Lab 11/19/18 1641  11/19/18 2221 11/20/18 0624 11/20/18 1119 11/20/18 1544  GLUCAP 152* 160* 154* 209* 192*   D-Dimer No results for input(s): DDIMER in the last 72 hours. Hgb A1c No results for input(s): HGBA1C in the last 72 hours. Lipid Profile No results for input(s): CHOL, HDL, LDLCALC, TRIG, CHOLHDL, LDLDIRECT in the last 72 hours. Thyroid function studies No results for input(s): TSH, T4TOTAL, T3FREE, THYROIDAB in the last 72 hours.  Invalid input(s): FREET3 Anemia work up No results for input(s): VITAMINB12, FOLATE, FERRITIN, TIBC, IRON, RETICCTPCT in the last 72 hours. Urinalysis    Component Value Date/Time   COLORURINE YELLOW 10/28/2018 1003   APPEARANCEUR HAZY (A) 10/28/2018 1003   LABSPEC 1.008 10/28/2018 1003   PHURINE 5.0 10/28/2018 1003   GLUCOSEU NEGATIVE 10/28/2018 1003   HGBUR NEGATIVE 10/28/2018 1003   BILIRUBINUR NEGATIVE 10/28/2018 1003   KETONESUR NEGATIVE 10/28/2018 1003   PROTEINUR NEGATIVE 10/28/2018 1003   UROBILINOGEN 2.0 (H) 02/21/2015 1914   NITRITE NEGATIVE 10/28/2018 1003   LEUKOCYTESUR NEGATIVE 10/28/2018 1003   Sepsis Labs Invalid input(s): PROCALCITONIN,  WBC,  LACTICIDVEN   Time coordinating discharge: 35 minutes  SIGNED:  Mercy Riding, MD  [facility] 11/20/2018, 6:26 PM  If 7PM-7AM, please contact night-coverage [email or web address] Password [email or web address]

## 2018-11-20 NOTE — Plan of Care (Signed)
  Problem: Education: Goal: Knowledge of General Education information will improve Description: Including pain rating scale, medication(s)/side effects and non-pharmacologic comfort measures Outcome: Completed/Met   Problem: Health Behavior/Discharge Planning: Goal: Ability to manage health-related needs will improve Outcome: Completed/Met   Problem: Clinical Measurements: Goal: Ability to maintain clinical measurements within normal limits will improve Outcome: Completed/Met Goal: Diagnostic test results will improve Outcome: Completed/Met Goal: Respiratory complications will improve Outcome: Completed/Met Goal: Cardiovascular complication will be avoided Outcome: Completed/Met   Problem: Activity: Goal: Risk for activity intolerance will decrease Outcome: Completed/Met   Problem: Nutrition: Goal: Adequate nutrition will be maintained Outcome: Completed/Met   Problem: Coping: Goal: Level of anxiety will decrease Outcome: Completed/Met   Problem: Elimination: Goal: Will not experience complications related to bowel motility Outcome: Completed/Met Goal: Will not experience complications related to urinary retention Outcome: Completed/Met   Problem: Pain Managment: Goal: General experience of comfort will improve Outcome: Completed/Met   Problem: Safety: Goal: Ability to remain free from injury will improve Outcome: Completed/Met   Problem: Skin Integrity: Goal: Risk for impaired skin integrity will decrease Outcome: Completed/Met   Problem: Education: Goal: Ability to demonstrate management of disease process will improve Outcome: Completed/Met Goal: Ability to verbalize understanding of medication therapies will improve Outcome: Completed/Met Goal: Individualized Educational Video(s) Outcome: Completed/Met   Problem: Activity: Goal: Capacity to carry out activities will improve Outcome: Completed/Met   Problem: Cardiac: Goal: Ability to achieve and  maintain adequate cardiopulmonary perfusion will improve Outcome: Completed/Met

## 2018-11-20 NOTE — Plan of Care (Signed)
  Problem: Activity: Goal: Risk for activity intolerance will decrease Outcome: Progressing   Problem: Elimination: Goal: Will not experience complications related to urinary retention Outcome: Progressing   Problem: Safety: Goal: Ability to remain free from injury will improve Outcome: Progressing   

## 2018-11-21 ENCOUNTER — Telehealth: Payer: Self-pay

## 2018-11-21 ENCOUNTER — Encounter (HOSPITAL_COMMUNITY): Payer: Medicaid Other | Admitting: Cardiology

## 2018-11-21 NOTE — Telephone Encounter (Addendum)
Transition Care Management Follow-up Telephone Call  Date of discharge and from where: 11/20/2018, [facility]   How have you been since you were released from the hospital? She said she is " tired but feeling all right."   Any questions or concerns? She did not have any questions/concerns and said " Opal Sidles, you know all of the answers to the questions."  She was brief on the phone and then said that she needed to go to the bathroom.   Items Reviewed:  Did the pt receive and understand the discharge instructions provided? yes  Medications obtained and verified? She said she has all of her medications and did not need to review the list.  No questions. She was able to state the correct instructions for the medications that have changed: torsemide and spironolactone   Any new allergies since your discharge? None reported   Dietary orders reviewed? She needed to hang up before this was reviewed.   Do you have support at home? She has been living with family and her boyfriend.  She said that she is in the process of moving and needs to be out of her current home by 11/28/2018.    Other (ie: DME, Home Health, etc) she did not want to discuss services.  She said that home health is still provided by [facility].    She has home O2 and has used at 3-4L. She also has a glucometer, nebulizer, BiPAP and a scale.  Functional Questionnaire: (I = Independent and D = Dependent) ADL's: independent, has RW and assist from family when needed  Follow up appointments reviewed:    PCP Hospital f/u appt confirmed? Appointment scheduled for 11/30/2018 @ 1050. She did not want to schedule anything prior to her move on 11/28/2018.   Informed her of the importance of keeping this appointment.  [facility] f/u appt confirmed?  She was a no show for cardiology appointment today.  Will need to re-schedule   Are transportation arrangements needed? Not reported   If their condition worsens, is the pt  aware to call  their PCP or go to the ED? yes  Was the patient provided with contact information for the PCP's office or ED? She has the phone number for [facility]  Was the pt encouraged to call back with questions or concerns?yes

## 2018-11-23 ENCOUNTER — Ambulatory Visit: Payer: Self-pay | Admitting: Family Medicine

## 2018-11-23 NOTE — Telephone Encounter (Signed)
Pt reports BP 95/45. Denies dizziness, mild headache. States taken on home monitor but does not seem aware of how to properly use. States took BP med at 1800. Advised to recheck at 2000. Advised to stay hydrated, go to ED if dizziness occurs. Advised to alert PCP in am. Pt verbalizes understanding.  Answer Assessment - Initial Assessment Questions 1. BLOOD PRESSURE: "What is the blood pressure?" "Did you take at least two measurements 5 minutes apart?"     95/45 2. ONSET: "When did you take your blood pressure?"     Few minutes ago 3. HOW: "How did you obtain the blood pressure?" (e.g., visiting nurse, automatic home BP monitor)    Home monitor 4. HISTORY: "Do you have a history of low blood pressure?" "What is your blood pressure normally?"     no 5. MEDICATIONS: "Are you taking any medications for blood pressure?" If yes: "Have they been changed recently?"      6. PULSE RATE: "Do you know what your pulse rate is?"       7. OTHER SYMPTOMS: "Have you been sick recently?" "Have you had a recent injury?"    In hospital 6/19  Protocols used: LOW BLOOD PRESSURE-A-AH

## 2018-11-27 ENCOUNTER — Other Ambulatory Visit: Payer: Self-pay | Admitting: Family Medicine

## 2018-11-27 ENCOUNTER — Telehealth: Payer: Self-pay | Admitting: Family Medicine

## 2018-11-27 DIAGNOSIS — I5042 Chronic combined systolic (congestive) and diastolic (congestive) heart failure: Secondary | ICD-10-CM

## 2018-11-27 NOTE — Telephone Encounter (Signed)
Tamara Silva is calling with [facility] wanting verbal orders stating patient refused her order for OT last week and needs the evaluation to this week. Please follow up.

## 2018-11-28 NOTE — Telephone Encounter (Signed)
Please give verbal order for start of OT

## 2018-11-29 NOTE — Telephone Encounter (Signed)
Have home health let her know that if she continues to refuse service then she may need to be set up with outpatient therapy.

## 2018-11-29 NOTE — Telephone Encounter (Signed)
Called and spoke with [facility] and informed her with what provider stated and she verbalized understanding.   Malary would like to know if patient refused again this week, can she push it out for next week?

## 2018-11-30 ENCOUNTER — Other Ambulatory Visit: Payer: Self-pay

## 2018-11-30 ENCOUNTER — Ambulatory Visit: Payer: Medicaid Other | Attending: Family Medicine | Admitting: Family Medicine

## 2018-11-30 ENCOUNTER — Encounter: Payer: Self-pay | Admitting: Family Medicine

## 2018-11-30 VITALS — BP 104/64 | HR 62 | Temp 98.3°F

## 2018-11-30 DIAGNOSIS — E662 Morbid (severe) obesity with alveolar hypoventilation: Secondary | ICD-10-CM | POA: Insufficient documentation

## 2018-11-30 DIAGNOSIS — Z9981 Dependence on supplemental oxygen: Secondary | ICD-10-CM

## 2018-11-30 DIAGNOSIS — Z955 Presence of coronary angioplasty implant and graft: Secondary | ICD-10-CM | POA: Insufficient documentation

## 2018-11-30 DIAGNOSIS — E1100 Type 2 diabetes mellitus with hyperosmolarity without nonketotic hyperglycemic-hyperosmolar coma (NKHHC): Secondary | ICD-10-CM | POA: Diagnosis not present

## 2018-11-30 DIAGNOSIS — Z79899 Other long term (current) drug therapy: Secondary | ICD-10-CM

## 2018-11-30 DIAGNOSIS — Z8673 Personal history of transient ischemic attack (TIA), and cerebral infarction without residual deficits: Secondary | ICD-10-CM | POA: Insufficient documentation

## 2018-11-30 DIAGNOSIS — Z794 Long term (current) use of insulin: Secondary | ICD-10-CM

## 2018-11-30 DIAGNOSIS — Z888 Allergy status to other drugs, medicaments and biological substances status: Secondary | ICD-10-CM | POA: Insufficient documentation

## 2018-11-30 DIAGNOSIS — I10 Essential (primary) hypertension: Secondary | ICD-10-CM

## 2018-11-30 DIAGNOSIS — I130 Hypertensive heart and chronic kidney disease with heart failure and stage 1 through stage 4 chronic kidney disease, or unspecified chronic kidney disease: Secondary | ICD-10-CM | POA: Diagnosis not present

## 2018-11-30 DIAGNOSIS — E1122 Type 2 diabetes mellitus with diabetic chronic kidney disease: Secondary | ICD-10-CM

## 2018-11-30 DIAGNOSIS — I2510 Atherosclerotic heart disease of native coronary artery without angina pectoris: Secondary | ICD-10-CM | POA: Diagnosis not present

## 2018-11-30 DIAGNOSIS — J440 Chronic obstructive pulmonary disease with acute lower respiratory infection: Secondary | ICD-10-CM | POA: Diagnosis not present

## 2018-11-30 DIAGNOSIS — Z8674 Personal history of sudden cardiac arrest: Secondary | ICD-10-CM | POA: Insufficient documentation

## 2018-11-30 DIAGNOSIS — Z833 Family history of diabetes mellitus: Secondary | ICD-10-CM | POA: Insufficient documentation

## 2018-11-30 DIAGNOSIS — Z803 Family history of malignant neoplasm of breast: Secondary | ICD-10-CM | POA: Insufficient documentation

## 2018-11-30 DIAGNOSIS — F1410 Cocaine abuse, uncomplicated: Secondary | ICD-10-CM | POA: Insufficient documentation

## 2018-11-30 DIAGNOSIS — Z886 Allergy status to analgesic agent status: Secondary | ICD-10-CM | POA: Insufficient documentation

## 2018-11-30 DIAGNOSIS — F319 Bipolar disorder, unspecified: Secondary | ICD-10-CM | POA: Insufficient documentation

## 2018-11-30 DIAGNOSIS — E785 Hyperlipidemia, unspecified: Secondary | ICD-10-CM | POA: Insufficient documentation

## 2018-11-30 DIAGNOSIS — I2782 Chronic pulmonary embolism: Secondary | ICD-10-CM | POA: Insufficient documentation

## 2018-11-30 DIAGNOSIS — I5042 Chronic combined systolic (congestive) and diastolic (congestive) heart failure: Secondary | ICD-10-CM | POA: Diagnosis not present

## 2018-11-30 DIAGNOSIS — J9611 Chronic respiratory failure with hypoxia: Secondary | ICD-10-CM

## 2018-11-30 DIAGNOSIS — Z7901 Long term (current) use of anticoagulants: Secondary | ICD-10-CM | POA: Insufficient documentation

## 2018-11-30 DIAGNOSIS — N183 Chronic kidney disease, stage 3 unspecified: Secondary | ICD-10-CM

## 2018-11-30 DIAGNOSIS — N179 Acute kidney failure, unspecified: Secondary | ICD-10-CM | POA: Diagnosis not present

## 2018-11-30 DIAGNOSIS — Z7982 Long term (current) use of aspirin: Secondary | ICD-10-CM | POA: Diagnosis not present

## 2018-11-30 DIAGNOSIS — E1169 Type 2 diabetes mellitus with other specified complication: Secondary | ICD-10-CM | POA: Insufficient documentation

## 2018-11-30 DIAGNOSIS — E1140 Type 2 diabetes mellitus with diabetic neuropathy, unspecified: Secondary | ICD-10-CM | POA: Insufficient documentation

## 2018-11-30 DIAGNOSIS — D638 Anemia in other chronic diseases classified elsewhere: Secondary | ICD-10-CM | POA: Diagnosis not present

## 2018-11-30 DIAGNOSIS — E876 Hypokalemia: Secondary | ICD-10-CM | POA: Diagnosis not present

## 2018-11-30 DIAGNOSIS — Z8249 Family history of ischemic heart disease and other diseases of the circulatory system: Secondary | ICD-10-CM | POA: Insufficient documentation

## 2018-11-30 DIAGNOSIS — Z825 Family history of asthma and other chronic lower respiratory diseases: Secondary | ICD-10-CM | POA: Insufficient documentation

## 2018-11-30 DIAGNOSIS — J449 Chronic obstructive pulmonary disease, unspecified: Secondary | ICD-10-CM

## 2018-11-30 DIAGNOSIS — K219 Gastro-esophageal reflux disease without esophagitis: Secondary | ICD-10-CM

## 2018-11-30 DIAGNOSIS — Z882 Allergy status to sulfonamides status: Secondary | ICD-10-CM | POA: Insufficient documentation

## 2018-11-30 DIAGNOSIS — Z09 Encounter for follow-up examination after completed treatment for conditions other than malignant neoplasm: Secondary | ICD-10-CM

## 2018-11-30 DIAGNOSIS — Z6841 Body Mass Index (BMI) 40.0 and over, adult: Secondary | ICD-10-CM | POA: Insufficient documentation

## 2018-11-30 DIAGNOSIS — Z800 Family history of malignant neoplasm of digestive organs: Secondary | ICD-10-CM | POA: Insufficient documentation

## 2018-11-30 DIAGNOSIS — Z87891 Personal history of nicotine dependence: Secondary | ICD-10-CM | POA: Insufficient documentation

## 2018-11-30 MED ORDER — OMEPRAZOLE 40 MG PO CPDR: 40.0000 mg | DELAYED_RELEASE_CAPSULE | Freq: Every day | ORAL | 3 refills | Status: DC

## 2018-11-30 MED ORDER — ISOSORB DINITRATE-HYDRALAZINE 20-37.5 MG PO TABS: 0.5000 | ORAL_TABLET | Freq: Three times a day (TID) | ORAL | 3 refills | Status: AC

## 2018-11-30 MED ORDER — ASPIRIN 81 MG PO CHEW: 81.0000 mg | CHEWABLE_TABLET | Freq: Every day | ORAL | 3 refills | Status: AC

## 2018-11-30 MED ORDER — SPIRONOLACTONE 25 MG PO TABS: 25.0000 mg | ORAL_TABLET | Freq: Every day | ORAL | 1 refills | Status: DC

## 2018-11-30 NOTE — Telephone Encounter (Signed)
Called Tamara Silva and informed her with what provider stated and she verbalized understanding and she stated she will call office back with updates

## 2018-11-30 NOTE — Progress Notes (Signed)
Patient ID: Tamara Silva, female    DOB: [birth year]  MRN: [id number]   SUBJECTIVE:  Tamara Silva is a 58 y.o. female who presents for hospital f/u. Where: [facility] When: 11/17/2018-11/20/2018 Primary Dx: Acute on chronic combined systolic and diastolic congestive heart failure, ejection fraction 30 to 35%, class III; hypokalemia, hypomagnesemia, acute kidney injury on CKD stage III, IDDM-2 with renal complication, iron deficiency anemia, B12 deficiency, history of ischemic CVA complicated by seizures, coronary artery disease status post stenting and GERD; oxygen dependence discharged on 4 L of continuous oxygen by nasal cannula  HPI:      59 yo female who is seen s/p recent hospitalization for CHF/COPD exacerbation.  She reports that she is feeling a lot better after her recent hospitalization.  She reports that during the hospitalization she died and had to be resuscitated.  She does not really recall what happened but was told that CPR was performed for 25 minutes.  She still has some bilateral chest soreness status post hospitalization.  She feels that her breathing is improved.  She reports that she lost a significant amount of fluid weight during her hospitalization.  She states that since her fluid medication was changed that the swelling in her legs is greatly improved.  Her blood sugars have been well controlled generally less than 120 fasting.  She denies any hypoglycemic episodes.  She denies any current issues with shortness of breath or cough.  She does continue to have issues with burning and stinging in her feet.  She reports that she now intends to take her health more seriously after her recent hospitalization.  Patient Active Problem List   Diagnosis Date Noted  . CHF exacerbation ([place]) 11/17/2018  . Cardiac arrest ([place]) 10/28/2018  . Seizure ([place]) 10/28/2018  . Noncompliance with treatment plan 07/24/2018  . General patient noncompliance 07/24/2018  .  Noncompliance with diet and medication regimen 07/24/2018  . Dysarthria   . Morbid obesity with BMI of 60.0-69.9, adult ([place])   . BiPAP (biphasic positive airway pressure) dependence   . Acute renal failure superimposed on stage 3 chronic kidney disease ([place]) 07/08/2018  . Pancytopenia ([place]) 07/08/2018  . Type II diabetes mellitus with renal manifestations ([place]) 07/08/2018  . Acute on chronic combined systolic and diastolic CHF (congestive heart failure) ([place]) 06/26/2018  . Hepatic steatosis 06/22/2018  . Chronic respiratory failure with hypoxia and hypercapnia (HCC)   . Hyperammonemia ([place])   . Acute on chronic diastolic CHF (congestive heart failure) ([place])   . On home O2   . Slow transit constipation   . Encephalopathy 06/09/2018  . Cocaine abuse ([place])   . Diabetes mellitus type 2 in obese ([place])   . Generalized tonic-clonic seizure ([place]) 06/05/2018  . Cerebral embolism with cerebral infarction 06/01/2018  . AKI (acute kidney injury) ([place]) 05/30/2018  . Pressure injury of skin 05/30/2018  . Acute on chronic anemia 05/29/2018  . Chronic respiratory failure with hypoxia ([place]) 01/27/2018  . Iron deficiency anemia 01/27/2018  . Tobacco abuse disorder 10/03/2017  . Dyslipidemia 04/29/2016  . Primary insomnia 04/29/2016  . Hypoxemia 09/08/2015  . Congestive heart failure (CHF) ([place]) 09/08/2015  . Obesity hypoventilation syndrome ([place])   . Microcytic anemia   . Acute encephalopathy   . Uncontrolled type 2 diabetes mellitus with hyperosmolarity without coma ([place])   . Acute on chronic respiratory failure with hypercapnia ([place])   . CAP (community acquired pneumonia)   . OSA (obstructive sleep apnea)   .  Acute and chr resp failure, unsp w hypoxia or hypercapnia ([place]) 06/22/2015  . Acute on chronic combined systolic (congestive) and diastolic (congestive) heart failure ([place]) 06/21/2015  . Hypoglycemia 06/21/2015  . Uncontrolled type 2 diabetes mellitus without complication, with long-term  current use of insulin ([place])   . Dyslipidemia associated with type 2 diabetes mellitus ([place]) 05/21/2015  . Hyperkalemia 05/21/2015  . COPD GOLD III  06/10/2014  . Acute respiratory failure with hypoxia ([place]) 03/04/2014  . CKD (chronic kidney disease), stage II 02/07/2014  . COPD exacerbation ([place]) 02/01/2014  . CAD S/P percutaneous coronary angioplasty - prior PCI to LAD; RCA PCI: new Xience Alpine DES 2.75 mm x 15 mm  10/07/2013  . Morbid obesity ([place]) 07/30/2013  . Chronic combined systolic and diastolic heart failure ([place]) 07/10/2013  . Chronic pulmonary embolism ([place]) 02/08/2013  . Bipolar disorder ([place]) 02/08/2013  . Essential hypertension 02/08/2013  . Chronic anticoagulation 11/27/2012  . Edema extremities 10/15/2012  . COPD (chronic obstructive pulmonary disease) ([place]) 10/15/2012  . Pulmonary embolism on right ([place]) 10/15/2012  . Hypertension 10/15/2012  . Insulin dependent diabetes mellitus ([place]) 10/15/2012     Current Outpatient Medications on File Prior to Visit  Medication Sig Dispense Refill  . ACCU-CHEK AVIVA PLUS test strip USE 1 STRIP TO CHECK GLUCOSE 3 TIMES DAILY (Patient taking differently: 1 each by Other route 3 (three) times daily. ) 100 each 2  . albuterol (PROAIR HFA) 108 (90 Base) MCG/ACT inhaler INHALE 2 PUFFS BY MOUTH 4 TIMES DAILY AS NEEDED (Patient taking differently: Inhale 2 puffs into the lungs 4 (four) times daily as needed for wheezing or shortness of breath. ) 8.5 each 2  . aspirin 81 MG chewable tablet Chew 1 tablet (81 mg total) by mouth daily for 30 days. 30 tablet 0  . carvedilol (COREG) 3.125 MG tablet TAKE 1 TABLET (3.125 MG TOTAL) BY MOUTH 2 (TWO) TIMES DAILY WITH A MEAL. 60 tablet 2  . Ensure Max Protein (ENSURE MAX PROTEIN) LIQD Take 330 mLs (11 oz total) by mouth 2 (two) times daily for 30 days. 19800 mL 0  . ferrous gluconate (FERGON) 324 MG tablet Take 1 tablet (324 mg total) by mouth daily with breakfast. Office visit needed prior to future  refills 30 tablet 0  . hydrocerin (EUCERIN) CREA Apply 1 application topically 2 (two) times daily.  0  . insulin detemir (LEVEMIR) 100 UNIT/ML injection Inject 0.12 mLs (12 Units total) into the skin at bedtime. 10 mL 11  . ipratropium-albuterol (DUONEB) 0.5-2.5 (3) MG/3ML SOLN Use nebulizer to inhale contents of one vial every 6 hours as needed for shortness of breath, wheezing 360 mL 11  . isosorbide-hydrALAZINE (BIDIL) 20-37.5 MG tablet Place 0.5 tablets into feeding tube 3 (three) times daily for 30 days. 45 tablet 0  . NEEDLE, DISP, 26 G 26G X 1/2" MISC 1 application by Does not apply route daily. 100 each 0  . omeprazole (PRILOSEC) 40 MG capsule Take 1 capsule (40 mg total) by mouth daily. 30 capsule 4  . polyethylene glycol (MIRALAX) 17 g packet Take 17 g by mouth every Monday, Wednesday, and Friday. 14 each 11  . potassium chloride SA (K-DUR) 20 MEQ tablet Take 1 tablet (20 mEq total) by mouth daily for 30 days. 30 tablet 0  . rosuvastatin (CRESTOR) 40 MG tablet Take 1 tablet (40 mg total) by mouth daily at 6 PM. 30 tablet 4  . senna-docusate (SENOKOT-S) 8.6-50 MG tablet Take 1 tablet by mouth  2 (two) times daily. If needed for constipation 60 tablet 4  . spironolactone (ALDACTONE) 25 MG tablet Take 1 tablet (25 mg total) by mouth daily for 30 days. 30 tablet 2  . torsemide (DEMADEX) 20 MG tablet Take 2 tablets (40 mg total) by mouth 2 (two) times daily. 120 tablet 1  . umeclidinium-vilanterol (ANORO ELLIPTA) 62.5-25 MCG/INH AEPB Inhale 1 puff into the lungs daily.     No current facility-administered medications on file prior to visit.     Allergies  Allergen Reactions  . Metolazone Other (See Comments)    Dizziness and falling  . Plavix [Clopidogrel Bisulfate] Other (See Comments)    High PRU's - non-responder  . Ibuprofen Other (See Comments)    Pt states she is not supposed to take ibuprofen because of other meds she is taking  . Nsaids Other (See Comments)    "I do not take  NSAIDs d/t interfering with other meds"    Social History   Tobacco Use  . Smoking status: Former Smoker    Packs/day: 0.50    Years: 20.00    Pack years: 10.00    Types: Cigarettes    Quit date: 04/30/2014    Years since quitting: 4.5  . Smokeless tobacco: Never Used  . Tobacco comment: smoked off and on x 20 years  Substance Use Topics  . Alcohol use: No    Alcohol/week: 0.0 standard drinks  . Drug use: Not Currently    Types: Cocaine     Family History  Problem Relation Age of Onset  . Stroke Mother   . Hypertension Mother   . Colon cancer Father   . Diabetes Father   . Heart attack Father   . Sarcoidosis Other   . Breast cancer Paternal Aunt   . Emphysema Brother   . Lung disease Brother        Unknown type, 3 brothers, one with liver and lung disease  . Asthma Paternal Aunt     Past Surgical History:  Procedure Laterality Date  . BIOPSY  07/10/2018   Procedure: BIOPSY;  Surgeon: [name], MD;  Location: [facility];  Service: Gastroenterology;;  . COLONOSCOPY WITH PROPOFOL N/A 08/03/2013   Procedure: COLONOSCOPY WITH PROPOFOL;  Surgeon: Jeryl Columbia, MD;  Location: [facility];  Service: Endoscopy;  Laterality: N/A;  . COLONOSCOPY WITH PROPOFOL N/A 07/10/2018   Procedure: COLONOSCOPY WITH PROPOFOL;  Surgeon: [name], MD;  Location: [facility];  Service: Gastroenterology;  Laterality: N/A;  . CORONARY ANGIOPLASTY WITH STENT PLACEMENT     CAD in 2006 x 2 and 2009 2 more- place din REx in [place] [facility]  . CORONARY ANGIOPLASTY WITH STENT PLACEMENT  10/07/2013   Xience Alpine DES 2.75  mm x 15  mm  . ESOPHAGOGASTRODUODENOSCOPY (EGD) WITH PROPOFOL N/A 08/03/2013   Procedure: ESOPHAGOGASTRODUODENOSCOPY (EGD) WITH PROPOFOL;  Surgeon: Jeryl Columbia, MD;  Location: [facility];  Service: Endoscopy;  Laterality: N/A;  . ESOPHAGOGASTRODUODENOSCOPY (EGD) WITH PROPOFOL N/A 07/10/2018   Procedure: ESOPHAGOGASTRODUODENOSCOPY (EGD) WITH PROPOFOL;  Surgeon: [name], MD;  Location: [facility];  Service: Gastroenterology;  Laterality: N/A;  . LEFT HEART CATHETERIZATION WITH CORONARY ANGIOGRAM N/A 10/07/2013   Procedure: LEFT HEART CATHETERIZATION WITH CORONARY ANGIOGRAM;  Surgeon: Leonie Man, MD;  Location: [facility] CATH LAB;  Service: Cardiovascular;  Laterality: N/A;  . RIGHT HEART CATH N/A 11/06/2018   Procedure: RIGHT HEART CATH;  Surgeon: Larey Dresser, MD;  Location: [facility] CV LAB;  Service: Cardiovascular;  Laterality: N/A;    ROS: Review of Systems  Constitutional: Positive for fatigue (improving). Negative for chills and fever.  HENT: Negative for sore throat and trouble swallowing.   Eyes: Negative for photophobia and visual disturbance.  Respiratory: Negative for cough and shortness of breath (chronic oxygen dependence).   Cardiovascular: Positive for chest pain (bilateral chest wall pain). Negative for palpitations and leg swelling.  Gastrointestinal: Negative for abdominal pain, constipation, diarrhea, nausea and vomiting.  Endocrine: Negative for cold intolerance, heat intolerance, polydipsia, polyphagia and polyuria.  Genitourinary: Negative for dysuria and frequency (due to fluid pills but otherwise normal).  Musculoskeletal: Negative for arthralgias and back pain.  Neurological: Positive for numbness. Negative for dizziness and headaches.  Hematological: Negative for adenopathy. Does not bruise/bleed easily.  Psychiatric/Behavioral: Negative for self-injury and suicidal ideas.     PHYSICAL EXAM: LMP 07/28/2013 Comment: perimenopausal   Physical Exam Vitals signs and nursing note reviewed.  Constitutional:      Appearance: Normal appearance. She is obese.     Comments: WNWD older female in NAD sitting in a wheelchair wearing oxygen by [place]. Boyfriend is also present. Patient with appearance of weight loss since last visit  Neck:     Musculoskeletal: Neck supple. No muscular tenderness.     Vascular: No carotid bruit.      Comments: No JVD Cardiovascular:     Rate and Rhythm: Normal rate and regular rhythm.  Pulmonary:     Effort: Pulmonary effort is normal.     Breath sounds: Normal breath sounds. No rhonchi or rales.  Abdominal:     Palpations: Abdomen is soft.     Tenderness: There is no abdominal tenderness. There is no right CVA tenderness, left CVA tenderness, guarding or rebound.  Musculoskeletal:        General: No tenderness or deformity.     Right lower leg: No edema.     Left lower leg: No edema.  Lymphadenopathy:     Cervical: No cervical adenopathy.  Skin:    General: Skin is warm and dry.  Neurological:     General: No focal deficit present.     Mental Status: She is alert and oriented to person, place, and time.  Psychiatric:        Mood and Affect: Mood normal.        Behavior: Behavior normal.    DM foot exam: no active skin breakdown on the feet; no pedal edema, nails are trimmed, 1 plus dorsalis pedis and posterior tibial pulses; decreased sensation with monofilament exam   ASSESSMENT AND PLAN: 1. Chronic combined systolic and diastolic congestive heart failure, NYHA class 2; EF 30-35%; Essential Hypertension; Hospital Discharge follow-up Patient with hospitalization secondary to COPD and CHF exacerbation as well as acute kidney injury secondary to fluid overload.  Patient states that her fluid overload/peripheral edema has greatly improved since being discharged on torsemide 40 mg twice daily in addition to the other medications for her CHF.  She reports that she is now checking her weight daily in follow-up of her CHF and is following salt and fluid restrictions.  Patient is provided with refills of her medications by BIDIL and Aldactone and reports that she currently has torsemide and Coreg. BP currently controlled.   Referral placed for cardiology as per hospital discharge summary but patient states that she has already had telephone visit with Dr. Marigene Ehlers.  Hospital records  reviewed and discussed with the patient including recommendations as per discharge summary.  Patient states that home health attempted to obtain blood work earlier this week but was unsuccessful.  Patient will have BMP, CBC and magnesium level as per hospital discharge summary suggestion. - Ambulatory referral to Cardiology - spironolactone (ALDACTONE) 25 MG tablet; Take 1 tablet (25 mg total) by mouth daily.  Dispense: 90 tablet; Refill: 1 - isosorbide-hydrALAZINE (BIDIL) 20-37.5 MG tablet; Take 0.5 tablets by mouth 3 (three) times daily.  Dispense: 135 tablet; Refill: 3  2. Chronic obstructive pulmonary disease, unspecified COPD type ([place]) 3. Chronic respiratory failure with hypoxia, on home O2 therapy ([place]) She reports that she continues to wear her oxygen continuously and has had no issues with increased shortness of breath or sensation of cough or chest congestion status post hospital discharge.  She is compliant with respiratory medications.  4. Type 2 diabetes mellitus with stage 3 chronic kidney disease, with long-term current use of insulin ([place]) Patient reports that her control of her blood sugars is improved and she is trying to be compliant with her diet as well as with use of insulin and monitoring of her blood sugars.  Patient will have repeat BMP.  Unfortunately her chronic renal disease and episodes of acute kidney injury make it risky her to try newer diabetes agents to help with control of her blood sugars.  From her report, her blood sugars appear to be better controlled at this time. - Basic Metabolic Panel  5. Hypokalemia Patient with hypokalemia during hospitalization and received potassium replacement and BMP will be done to recheck potassium level and patient will be notified if any further intervention is needed based on these results - Basic Metabolic Panel  6. Hypomagnesemia Patient required magnesium replacement during her hospitalization and she will have repeat  magnesium level and will be notified if any further interventions are needed based on the results - Magnesium  7. Anemia of chronic disease; Long term use of aspirin therapy CBC in follow-up of anemia and patient is on long term ASA therapy due to CAD which also increases her risk of anemia - CBC with Differential  8. Gastroesophageal reflux disease, esophagitis presence not specified Refill provided for omeprazole.  Patient reports no current reflux symptoms.  She should continue to avoid late night eating, avoid known trigger foods and continue to take omeprazole for stomach protection due to long-term use of aspirin therapy - omeprazole (PRILOSEC) 40 MG capsule; Take 1 capsule (40 mg total) by mouth daily.  Dispense: 90 capsule; Refill: 3  9. Coronary artery disease involving native coronary artery of native heart without angina pectoris She reports no current issues with chest pain other than musculoskeletal chest pain related to resuscitation efforts during her hospitalization.  She will continue aspirin as well as secondary prevention by controlling her blood sugars, lipids and diabetes. - aspirin 81 MG chewable tablet; Chew 1 tablet (81 mg total) by mouth daily.  Dispense: 90 tablet; Refill: 3  10. Long-term use of high-risk medication She will have BMP at today's visit in follow-up of her long-term use of high-risk medications for CHF and diabetes.  Addendum: While in lab, patient had RMA contact me regarding medication to help with pain due to peripheral neuropathy.  Patient has tried gabapentin in the past without success per her report.  Suggested that patient try an over-the-counter capsaicin cream at this time and may take over-the-counter Tylenol.  An After Visit Summary was printed and given to the patient.  Return in about 6 weeks (around 01/11/2019) for chronic issues.  Antony Blackbird, MD, [facility]

## 2018-11-30 NOTE — Progress Notes (Signed)
Hospital f/u.

## 2018-12-01 LAB — CBC WITH DIFFERENTIAL/PLATELET
Basophils Absolute: 0.1 x10E3/uL (ref 0.0–0.2)
Basos: 1 %
EOS (ABSOLUTE): 0.2 x10E3/uL (ref 0.0–0.4)
Eos: 2 %
Hematocrit: 34.6 % (ref 34.0–46.6)
Hemoglobin: 11.2 g/dL (ref 11.1–15.9)
Immature Grans (Abs): 0 x10E3/uL (ref 0.0–0.1)
Immature Granulocytes: 0 %
Lymphocytes Absolute: 3.4 x10E3/uL — ABNORMAL HIGH (ref 0.7–3.1)
Lymphs: 37 %
MCH: 26 pg — ABNORMAL LOW (ref 26.6–33.0)
MCHC: 32.4 g/dL (ref 31.5–35.7)
MCV: 80 fL (ref 79–97)
Monocytes Absolute: 1 x10E3/uL — ABNORMAL HIGH (ref 0.1–0.9)
Monocytes: 11 %
Neutrophils Absolute: 4.6 x10E3/uL (ref 1.4–7.0)
Neutrophils: 49 %
Platelets: 358 x10E3/uL (ref 150–450)
RBC: 4.31 x10E6/uL (ref 3.77–5.28)
RDW: 15.4 % (ref 11.7–15.4)
WBC: 9.3 x10E3/uL (ref 3.4–10.8)

## 2018-12-01 LAB — MAGNESIUM: Magnesium: 1.7 mg/dL (ref 1.6–2.3)

## 2018-12-01 LAB — BASIC METABOLIC PANEL WITH GFR
BUN/Creatinine Ratio: 24 — ABNORMAL HIGH (ref 9–23)
BUN: 44 mg/dL — ABNORMAL HIGH (ref 6–24)
CO2: 30 mmol/L — ABNORMAL HIGH (ref 20–29)
Calcium: 9.8 mg/dL (ref 8.7–10.2)
Chloride: 96 mmol/L (ref 96–106)
Creatinine, Ser: 1.83 mg/dL — ABNORMAL HIGH (ref 0.57–1.00)
GFR calc Af Amer: 34 mL/min/1.73 — ABNORMAL LOW
GFR calc non Af Amer: 30 mL/min/1.73 — ABNORMAL LOW
Glucose: 167 mg/dL — ABNORMAL HIGH (ref 65–99)
Potassium: 4.3 mmol/L (ref 3.5–5.2)
Sodium: 141 mmol/L (ref 134–144)

## 2018-12-04 ENCOUNTER — Other Ambulatory Visit: Payer: Self-pay | Admitting: Family Medicine

## 2018-12-04 ENCOUNTER — Telehealth: Payer: Self-pay | Admitting: *Deleted

## 2018-12-04 ENCOUNTER — Telehealth: Payer: Self-pay | Admitting: Family Medicine

## 2018-12-04 ENCOUNTER — Telehealth: Payer: Self-pay

## 2018-12-04 DIAGNOSIS — N183 Chronic kidney disease, stage 3 unspecified: Secondary | ICD-10-CM

## 2018-12-04 DIAGNOSIS — E1122 Type 2 diabetes mellitus with diabetic chronic kidney disease: Secondary | ICD-10-CM

## 2018-12-04 DIAGNOSIS — Z794 Long term (current) use of insulin: Secondary | ICD-10-CM

## 2018-12-04 NOTE — Telephone Encounter (Signed)
Patient calling requesting refills for her Novolog. Per pt this should be on her med list because she takes that every day and its a sliding scale. Per pt she is taking anywhere from 15-20 units.

## 2018-12-04 NOTE — Telephone Encounter (Signed)
1) Medication(s) Requested (by name): Novolog levimere Patient states they do not have any  medication 2) Pharmacy of Choice:  [facility]

## 2018-12-04 NOTE — Telephone Encounter (Signed)
CMN for non invasive ventiloator faxed to [facility]

## 2018-12-04 NOTE — Telephone Encounter (Signed)
It looks like her short acting Novolog was stopped during her January hospital admission. I will place a referral for her to see a diabetes specialist to see if she needs to be back on short acting insulin

## 2018-12-04 NOTE — Progress Notes (Signed)
Patient ID: Tamara Silva, female   DOB: [birth year], 58 y.o.   MRN: [id number]   Patient called to request refill of her short acting insulin stating that it should be on her medication list that she takes this medication anywhere from 15 to 20 units 3 times daily.  On review of her past records, her NovoLog was discontinued during her January 2020 hospitalization.  She is currently on Levemir 12 units daily which has been decreased from prior 18 mg daily back in January.  I could not find a recent hemoglobin A1c as patient missed appointment to have this done in May.  Patient will be referred to endocrinology for continued management of her diabetes.

## 2018-12-05 NOTE — Telephone Encounter (Signed)
Pt is no longer on novolog, and she had a years supply of Levemir sent to [facility] on [facility]/07/18. She needs to contact her pharmacy directly.

## 2018-12-05 NOTE — Telephone Encounter (Signed)
Spoke with patient and informed her with what provider stated. Staff provided the name of the referral location to patient and their phone number.   Per pt she wants to know what should she take for her diabetes in the morning now. Per pt she uses the Levemir for her night time insulin. Per pt what are they trying to do to her make her die? Per pt she can't just only be on Levemir. Staff informed her with what provider stated again and informed her that the diabetes specialist is suppose to better help her manage her diabetes. Patient agreed to call them to schedule the appt but at the end of the call, patient was a bit upset.

## 2018-12-06 ENCOUNTER — Other Ambulatory Visit: Payer: Self-pay | Admitting: Family Medicine

## 2018-12-06 ENCOUNTER — Telehealth: Payer: Self-pay | Admitting: Family Medicine

## 2018-12-06 DIAGNOSIS — N183 Chronic kidney disease, stage 3 unspecified: Secondary | ICD-10-CM

## 2018-12-06 DIAGNOSIS — Z794 Long term (current) use of insulin: Secondary | ICD-10-CM

## 2018-12-06 DIAGNOSIS — E1122 Type 2 diabetes mellitus with diabetic chronic kidney disease: Secondary | ICD-10-CM

## 2018-12-06 MED ORDER — NOVOLOG FLEXPEN 100 UNIT/ML ~~LOC~~ SOPN: PEN_INJECTOR | SUBCUTANEOUS | 3 refills | Status: DC

## 2018-12-06 NOTE — Telephone Encounter (Signed)
Spoke with patient and informed her with what provider stated.   Per pt her AM blood sugar has been as follows   11-29-18..............[name] Kitchen 116 11-30-18..............[name] Kitchen 191 12-01-18..............[name] Kitchen 129 12-02-18..............[name] Kitchen 155 12-03-18............... 210 (Per pt, Day she ran out of her Novolog) 12-04-18..............[name] Kitchen 141 (Per pt, Started taking an old Novolog and day she called the pharmacy for more refills) 12-05-18..............[name] Kitchen 174 (Yesterday) 12-06-18..............[name] Kitchen 126 (Today)

## 2018-12-06 NOTE — Telephone Encounter (Signed)
Informed patient with what provider stated. Patient verbalized understanding.

## 2018-12-06 NOTE — Telephone Encounter (Signed)
Spoke with patient and informed her with what provider stated and patient verbalized understanding.  

## 2018-12-06 NOTE — Progress Notes (Signed)
Patient ID: Tamara Silva, female   DOB: 07-24-1960, 58 y.o.   MRN: [id number]   Patient with complaint of needing refill of Novolog- med was stopped during her hospitalization in Jan 2020 and she is on Levemir but feels that she needs Novolog as well. I will send in Novolog at 5 units premeal

## 2018-12-06 NOTE — Telephone Encounter (Signed)
Mrs. Torosian do not already have an Endocrinologist. Did you want her to make that November appt and you'll continue filling her Novolog until that appt?

## 2018-12-06 NOTE — Telephone Encounter (Signed)
Please ask patient how her morning blood sugars are running. Levemir is a long acting insulin that she only needs to take once per day. Her Levemir dose can be increased if her blood sugars are elevated

## 2018-12-06 NOTE — Telephone Encounter (Signed)
Pt would like to be called she has some  Information she would like to discuss..wont disclose information with me..please follow up

## 2018-12-06 NOTE — Telephone Encounter (Signed)
Please let patient know that I sent in a refill of her novolog to take 5 units before each meal and she should continue her Levemir and follow-up with endocrinology

## 2018-12-06 NOTE — Telephone Encounter (Signed)
Spoke with patient and she stated that she called the Endocrinologist to sch her appt due to the referral. Patient stated that they told her to let her provider know that they don't have any available appt until November if the provider wants her to see a diabetic specialist soon.

## 2018-12-06 NOTE — Telephone Encounter (Signed)
I placed an endocrinology referral and I think that Tamara Silva has made the referral. She has enough Novolog refills until she sees endocrinology- I think the appointment is with Dr. Dwyane Dee. She should of course call or come into the office if her blood sugars are too high or too low

## 2018-12-06 NOTE — Telephone Encounter (Signed)
Let her know that I sent in Novolog for her to take premeal and she can follow-up with her endocrinologist

## 2018-12-07 ENCOUNTER — Telehealth: Payer: Self-pay

## 2018-12-07 NOTE — Telephone Encounter (Signed)
Signed orders for home OT faxed to [facility]

## 2018-12-12 ENCOUNTER — Telehealth: Payer: Self-pay | Admitting: Family Medicine

## 2018-12-12 NOTE — Telephone Encounter (Signed)
Molly at [facility] at [facility] [phone] called to say pt refused home health services last week. Please provide another order for this week for occupational services.

## 2018-12-15 NOTE — Telephone Encounter (Signed)
Please give verbal approval for an additional week of occupational therapy

## 2018-12-19 NOTE — Telephone Encounter (Signed)
Message left on voicemail  for [facility] for OT order extension.

## 2018-12-26 ENCOUNTER — Other Ambulatory Visit: Payer: Self-pay

## 2018-12-26 ENCOUNTER — Other Ambulatory Visit: Payer: Self-pay | Admitting: Family Medicine

## 2018-12-26 ENCOUNTER — Encounter (HOSPITAL_COMMUNITY): Payer: Self-pay

## 2018-12-26 ENCOUNTER — Ambulatory Visit (HOSPITAL_COMMUNITY)
Admission: RE | Admit: 2018-12-26 | Discharge: 2018-12-26 | Disposition: A | Discharge status: Home / Self Care | Payer: Medicaid Other | Source: Ambulatory Visit | Attending: Cardiology | Admitting: Cardiology

## 2018-12-26 VITALS — BP 116/60 | HR 73 | Wt 273.8 lb

## 2018-12-26 DIAGNOSIS — I252 Old myocardial infarction: Secondary | ICD-10-CM | POA: Diagnosis not present

## 2018-12-26 DIAGNOSIS — Z8249 Family history of ischemic heart disease and other diseases of the circulatory system: Secondary | ICD-10-CM | POA: Insufficient documentation

## 2018-12-26 DIAGNOSIS — N183 Chronic kidney disease, stage 3 unspecified: Secondary | ICD-10-CM

## 2018-12-26 DIAGNOSIS — J9611 Chronic respiratory failure with hypoxia: Secondary | ICD-10-CM | POA: Insufficient documentation

## 2018-12-26 DIAGNOSIS — I2510 Atherosclerotic heart disease of native coronary artery without angina pectoris: Secondary | ICD-10-CM

## 2018-12-26 DIAGNOSIS — Z79899 Other long term (current) drug therapy: Secondary | ICD-10-CM | POA: Diagnosis not present

## 2018-12-26 DIAGNOSIS — Z823 Family history of stroke: Secondary | ICD-10-CM | POA: Diagnosis not present

## 2018-12-26 DIAGNOSIS — E538 Deficiency of other specified B group vitamins: Secondary | ICD-10-CM | POA: Diagnosis not present

## 2018-12-26 DIAGNOSIS — I130 Hypertensive heart and chronic kidney disease with heart failure and stage 1 through stage 4 chronic kidney disease, or unspecified chronic kidney disease: Secondary | ICD-10-CM | POA: Diagnosis not present

## 2018-12-26 DIAGNOSIS — E662 Morbid (severe) obesity with alveolar hypoventilation: Secondary | ICD-10-CM | POA: Diagnosis not present

## 2018-12-26 DIAGNOSIS — I5042 Chronic combined systolic (congestive) and diastolic (congestive) heart failure: Secondary | ICD-10-CM

## 2018-12-26 DIAGNOSIS — Z8673 Personal history of transient ischemic attack (TIA), and cerebral infarction without residual deficits: Secondary | ICD-10-CM | POA: Insufficient documentation

## 2018-12-26 DIAGNOSIS — Z8674 Personal history of sudden cardiac arrest: Secondary | ICD-10-CM | POA: Insufficient documentation

## 2018-12-26 DIAGNOSIS — I5022 Chronic systolic (congestive) heart failure: Secondary | ICD-10-CM | POA: Insufficient documentation

## 2018-12-26 DIAGNOSIS — I272 Pulmonary hypertension, unspecified: Secondary | ICD-10-CM | POA: Diagnosis not present

## 2018-12-26 DIAGNOSIS — Z87891 Personal history of nicotine dependence: Secondary | ICD-10-CM | POA: Insufficient documentation

## 2018-12-26 DIAGNOSIS — J449 Chronic obstructive pulmonary disease, unspecified: Secondary | ICD-10-CM | POA: Diagnosis not present

## 2018-12-26 DIAGNOSIS — E119 Type 2 diabetes mellitus without complications: Secondary | ICD-10-CM | POA: Diagnosis not present

## 2018-12-26 DIAGNOSIS — Z833 Family history of diabetes mellitus: Secondary | ICD-10-CM | POA: Diagnosis not present

## 2018-12-26 DIAGNOSIS — Z825 Family history of asthma and other chronic lower respiratory diseases: Secondary | ICD-10-CM | POA: Insufficient documentation

## 2018-12-26 DIAGNOSIS — Z9981 Dependence on supplemental oxygen: Secondary | ICD-10-CM | POA: Diagnosis not present

## 2018-12-26 DIAGNOSIS — Z9861 Coronary angioplasty status: Secondary | ICD-10-CM

## 2018-12-26 DIAGNOSIS — D649 Anemia, unspecified: Secondary | ICD-10-CM | POA: Diagnosis not present

## 2018-12-26 DIAGNOSIS — Z955 Presence of coronary angioplasty implant and graft: Secondary | ICD-10-CM | POA: Diagnosis not present

## 2018-12-26 DIAGNOSIS — Z794 Long term (current) use of insulin: Secondary | ICD-10-CM | POA: Insufficient documentation

## 2018-12-26 DIAGNOSIS — Z7982 Long term (current) use of aspirin: Secondary | ICD-10-CM | POA: Diagnosis not present

## 2018-12-26 DIAGNOSIS — Z86711 Personal history of pulmonary embolism: Secondary | ICD-10-CM | POA: Diagnosis not present

## 2018-12-26 DIAGNOSIS — Z800 Family history of malignant neoplasm of digestive organs: Secondary | ICD-10-CM | POA: Insufficient documentation

## 2018-12-26 LAB — BASIC METABOLIC PANEL
Anion gap: 10 (ref 5–15)
BUN: 27 mg/dL — ABNORMAL HIGH (ref 6–20)
CO2: 30 mmol/L (ref 22–32)
Calcium: 9.3 mg/dL (ref 8.9–10.3)
Chloride: 100 mmol/L (ref 98–111)
Creatinine, Ser: 1.56 mg/dL — ABNORMAL HIGH (ref 0.44–1.00)
GFR calc Af Amer: 42 mL/min — ABNORMAL LOW (ref >60)
GFR calc non Af Amer: 36 mL/min — ABNORMAL LOW (ref >60)
Glucose, Bld: 155 mg/dL — ABNORMAL HIGH (ref 70–99)
Potassium: 3.7 mmol/L (ref 3.5–5.1)
Sodium: 140 mmol/L (ref 135–145)

## 2018-12-26 MED ORDER — ANORO ELLIPTA 62.5-25 MCG/INH IN AEPB: 1.0000 | INHALATION_SPRAY | Freq: Every day | RESPIRATORY_TRACT | 11 refills | Status: DC

## 2018-12-26 MED ORDER — POTASSIUM CHLORIDE CRYS ER 20 MEQ PO TBCR: 20.0000 meq | EXTENDED_RELEASE_TABLET | Freq: Every day | ORAL | 3 refills | Status: DC

## 2018-12-26 MED ORDER — TORSEMIDE 20 MG PO TABS: ORAL_TABLET | ORAL | 3 refills | Status: DC

## 2018-12-26 MED ORDER — ALBUTEROL SULFATE HFA 108 (90 BASE) MCG/ACT IN AERS: INHALATION_SPRAY | RESPIRATORY_TRACT | 11 refills | Status: DC

## 2018-12-26 NOTE — Progress Notes (Signed)
Patient ID: Tamara Silva, female   DOB: 05-Jan-1961, 58 y.o.   MRN: [id number]   Message received from the [facility] for patient's cardiologist that she stated at her cardiology appointment that she was having difficulty obtaining refills of Anoro and Albuterol. I have not received any requests for refills of this medication but medications will be refilled.

## 2018-12-26 NOTE — Progress Notes (Signed)
PCP: [facility].  Primary Cardiologist: Dr Aundra Dubin   HPI: Ms. Wheeling is a 58 year old with a hx of CHF, HTN, DM-2, HLD, CVA 1/20 and CAD beginning in 2006, 2009 and in 2015 STEMI with Xience DES to distal RCA, at that time LAD stent with mild in-stent restenosis and EF was 30%.  She has had freq admits for anemia and combined systolic and diastolic HF with transfusions and diuresis. Her Prasugrel was stopped. No source of bleeding found on EGD and colonoscopy.   She is on home 02 and nocturnal BiPAP.   Also with obesity hypoventilation syndrome and she did stop tobacco in Jan this year.  Also hx of chronic iron def anemia.  Hx of PE in 2014.    Hx of brachycardia with coreg at higher dose.  Last echo 05/31/18 with EF 40-45%, G2DD, PA pk pressure 71 mmHg.  Carotid dopplers 06/01/18 with 1-39% bil stenosis.   Admitted 10/25/18 with SOB and edema, + productive cough.  This started over a few weeks. Hospital course complicated by PEA arrest on 5/30. CPR for 25 minutes.  Extubated 6/1.  HF team followed closely to optimize HF. Discharged on home oxygen.   Readmitted  11/17/18 with lower extremity edema and volume overload. Diuresed with IV lasix and transitioned to torsemide 40 mg twice daily. Discharged on 11/21/18 to home on oxygen.   Today she returns for HF follow up. Overall feeling fine. Able to walk around a little more. SOB with exertion but feeling better. Denies PND/Orthopnea. No chest pain. Appetite ok. Eating bologna everyday. No fever or chills. Not weighing at home. Taking all medications. Lives with her daughter and boyfriend. Received transportation by [facility].   Echo 09/2018:  EF 30-35%, mild RV dilation.   RHC Procedural Findings: Hemodynamics (mmHg) RA mean 9 RV 60/12 PA 62/22, mean 37 PCWP mean 15 Oxygen saturations: PA 76% AO 96% Cardiac Output (Fick) 10.4  Cardiac Index (Fick) 4.29 PVR 2.1 WU   ROS: All systems negative except as listed in HPI, PMH and  Problem List.  SH:  Social History   Socioeconomic History  . Marital status: Single    Spouse name: Not on file  . Number of children: 1  . Years of education: Not on file  . Highest education level: Not on file  Occupational History  . Not on file  Social Needs  . Financial resource strain: Somewhat hard  . Food insecurity    Worry: Sometimes true    Inability: Sometimes true  . Transportation needs    Medical: Yes    Non-medical: Yes  Tobacco Use  . Smoking status: Former Smoker    Packs/day: 0.50    Years: 20.00    Pack years: 10.00    Types: Cigarettes    Quit date: 04/30/2014    Years since quitting: 4.6  . Smokeless tobacco: Never Used  . Tobacco comment: smoked off and on x 20 years  Substance and Sexual Activity  . Alcohol use: No    Alcohol/week: 0.0 standard drinks  . Drug use: Not Currently    Types: Cocaine  . Sexual activity: Not Currently  Lifestyle  . Physical activity    Days per week: 0 days    Minutes per session: 0 min  . Stress: Rather much  Relationships  . Social Herbalist on phone: Twice a week    Gets together: Once a week    Attends religious service: 1  to 4 times per year    Active member of club or organization: No    Attends meetings of clubs or organizations: Never    Relationship status: Living with partner  . Intimate partner violence    Fear of current or ex partner: Patient refused    Emotionally abused: Patient refused    Physically abused: Patient refused    Forced sexual activity: Patient refused  Other Topics Concern  . Not on file  Social History Narrative   Origibnally from [employer]   Most recently from [place]   Daughter lives in town      On Social security to CHF, COPD, CAD    FH:  Family History  Problem Relation Age of Onset  . Stroke Mother   . Hypertension Mother   . Colon cancer Father   . Diabetes Father   . Heart attack Father   . Sarcoidosis Other   . Breast cancer Paternal Aunt    . Emphysema Brother   . Lung disease Brother        Unknown type, 3 brothers, one with liver and lung disease  . Asthma Paternal Aunt     Past Medical History:  Diagnosis Date  . Asthma   . CAD (coronary artery disease)   . Chronic combined systolic (congestive) and diastolic (congestive) heart failure ([place])   . Chronic iron deficiency anemia   . CVA (cerebral vascular accident) ([place]) 05/2018  . Diabetes mellitus without complication ([place])   . DOE (dyspnea on exertion)   . Edema of both legs   . Hepatic steatosis   . Hypertension   . Pulmonary emboli ([place]) 2014  . Respiratory failure ([place]) 09/2018  . Seizure ([place]) 05/2018    Current Outpatient Medications  Medication Sig Dispense Refill  . ACCU-CHEK AVIVA PLUS test strip USE 1 STRIP TO CHECK GLUCOSE 3 TIMES DAILY (Patient taking differently: 1 each by Other route 3 (three) times daily. ) 100 each 2  . albuterol (PROAIR HFA) 108 (90 Base) MCG/ACT inhaler INHALE 2 PUFFS BY MOUTH 4 TIMES DAILY AS NEEDED (Patient taking differently: Inhale 2 puffs into the lungs 4 (four) times daily as needed for wheezing or shortness of breath. ) 8.5 each 2  . aspirin 81 MG chewable tablet Chew 1 tablet (81 mg total) by mouth daily. 90 tablet 3  . carvedilol (COREG) 3.125 MG tablet TAKE 1 TABLET (3.125 MG TOTAL) BY MOUTH 2 (TWO) TIMES DAILY WITH A MEAL. 60 tablet 2  . ferrous sulfate 324 MG TBEC Take 324 mg by mouth.    . hydrocerin (EUCERIN) CREA Apply 1 application topically 2 (two) times daily.  0  . insulin aspart (NOVOLOG FLEXPEN) 100 UNIT/ML FlexPen Inject 5 units three times daily before each meal 15 mL 3  . insulin detemir (LEVEMIR) 100 UNIT/ML injection Inject 0.12 mLs (12 Units total) into the skin at bedtime. 10 mL 11  . ipratropium-albuterol (DUONEB) 0.5-2.5 (3) MG/3ML SOLN Use nebulizer to inhale contents of one vial every 6 hours as needed for shortness of breath, wheezing 360 mL 11  . isosorbide-hydrALAZINE (BIDIL) 20-37.5 MG tablet  Take 0.5 tablets by mouth 3 (three) times daily. 135 tablet 3  . NEEDLE, DISP, 26 G 26G X 1/2" MISC 1 application by Does not apply route daily. 100 each 0  . omeprazole (PRILOSEC) 40 MG capsule Take 1 capsule (40 mg total) by mouth daily. 90 capsule 3  . polyethylene glycol (MIRALAX) 17 g packet Take 17 g  by mouth every Monday, Wednesday, and Friday. 14 each 11  . rosuvastatin (CRESTOR) 40 MG tablet Take 1 tablet (40 mg total) by mouth daily at 6 PM. 30 tablet 4  . spironolactone (ALDACTONE) 25 MG tablet Take 1 tablet (25 mg total) by mouth daily. 90 tablet 1  . torsemide (DEMADEX) 20 MG tablet Take 2 tablets (40 mg total) by mouth 2 (two) times daily. 120 tablet 1  . umeclidinium-vilanterol (ANORO ELLIPTA) 62.5-25 MCG/INH AEPB Inhale 1 puff into the lungs daily.    . ferrous gluconate (FERGON) 324 MG tablet Take 1 tablet (324 mg total) by mouth daily with breakfast. Office visit needed prior to future refills (Patient not taking: Reported on 12/26/2018) 30 tablet 0  . potassium chloride SA (K-DUR) 20 MEQ tablet Take 1 tablet (20 mEq total) by mouth daily for 30 days. 30 tablet 0  . senna-docusate (SENOKOT-S) 8.6-50 MG tablet Take 1 tablet by mouth 2 (two) times daily. If needed for constipation (Patient not taking: Reported on 11/30/2018) 60 tablet 4   No current facility-administered medications for this encounter.     Vitals:   12/26/18 1128  BP: 116/60  Pulse: 73  SpO2: 93%  Weight: 124.2 kg (273 lb 12.8 oz)   Wt Readings from Last 3 Encounters:  12/26/18 124.2 kg (273 lb 12.8 oz)  11/20/18 118.7 kg (261 lb 11.2 oz)  11/07/18 121.4 kg (267 lb 10.2 oz)    PHYSICAL EXAM:  General:  Well appearing. No resp difficulty HEENT: normal Neck: supple. JVP flat. Carotids 2+ bilaterally; no bruits. No lymphadenopathy or thryomegaly appreciated. Cor: PMI normal. Regular rate & rhythm. No rubs, gallops or murmurs. Lungs: clear Abdomen: soft, nontender, nondistended. No hepatosplenomegaly. No  bruits or masses. Good bowel sounds. Extremities: no cyanosis, clubbing, rash, R and LLE trace -1+ edema Neuro: alert & orientedx3, cranial nerves grossly intact. Moves all 4 extremities w/o difficulty. Affect pleasant.   ASSESSMENT & PLAN: 1. H/O PEA arrest: Due to severe hypoxia on 5/30.   2. Chronic systolic CHF: Suspect ischemic cardiomyopathy. Echo in 1/20 with EF 40-45%, RV moderately dilated, PASP 71 mmHg. Echo this admission with EF 30-35%, mildly dilated RV. Suspect significant RV failure, may be related to OHS/OSA and COPD. She was massively volume overloaded and also likely has Upson. IV Lasix stopped 6/6.  RHC done 6/8 showed high cardiac output and minimally elevated filling pressures.  - NYHA IIIb chronically. Volume status elevated.  - Increase torsemide to 60 mg am and continue torsemide 40 mg in pm  - Continue Bidil 1/2 tab tid  - Continue spironolactone 25 mg daily.  - Continue Coreg 3.125 mg bid.  3 CKD Stage III.   Baseline creatinine around 1.3-1.5  - Check BMEt today.  4. Chronic hypoxic respiratory failure: Post-arrest, extubated 6/1. Now on nasal cannula. Continue home oxygen  3. COPD: On home oxygen at baseline. Has quit smoking.  4. OHS/OSA: On home oxygen at all times 5. CAD: History of PCI, most recently had inferior STEMI in 5/15 with DES to RCA. She has been having chest pain but chest wall was tender post-CPR, doubt ischemia.  - Continue ASA 81 and Crestor 40 daily - With fall in EF, will consider coronary angiography eventually if we can stabilize her creatinine. Check BMET today.  6. Remote PE: Continue Lovenox for DVT prophylaxis.   7. Anemia: History of GI bleeding, had endoscopies earlier this year with no bleeding source found. Also with history of B12 deficiency.  8. Pulmonary hypertension: Looks like high output PH by RHC.   Follow up in 3-4 weeks with Dr Aundra Dubin. Refer back to HF Paramedicine. She is at high risk for readmits due to social  barriers.

## 2018-12-26 NOTE — Progress Notes (Signed)
CSW consulted to speak with pt regarding changing her PCP.  Pt has medicaid so is assigned to [facility] but she has not had a good experience with her provider there recently and would prefer to change to a different PCP.  CSW explained that since she is assigned to [facility] she would either need to change to a different provider in that office or call her Medicaid case worker to get assigned to a different office.  Pt is agreeable to changing to a different provider within [facility] as she has a good relationship with the employees there other than her provider.  CSW provided with names of other providers at their office and patient plans to call to switch providers.  Pt also reports she has several medications (potassium, albuterol, anoro) that she has been trying to get refilled through PCP but has not heard back from.  CSW send message to provider to request it get refilled.  CSW will continue to follow and assist as needed  Jorge Ny, [facility] Desk#: [phone] Cell#: [phone]

## 2018-12-26 NOTE — Patient Instructions (Addendum)
Lab work done today. We will notify you of any abnormal lab. No news is good news.  You have been referred to the heart failure social worker. She will be working with you to find another pcp.  You have been referred to Heart Failure Paramedicine. They will be in contact with you in order to set up appointments.  INCREASE Torsemide to 60mg  (3 tabs) every morning and 40mg  (2 tabs) every evening.  Please follow up with the [facility] in 3-4 weeks.  At the [facility], you and your health needs are our priority. As part of our continuing mission to provide you with exceptional heart care, we have created designated Provider Care Teams. These Care Teams include your primary Cardiologist (physician) and Advanced Practice Providers (APPs- Physician Assistants and Nurse Practitioners) who all work together to provide you with the care you need, when you need it.   You may see any of the following providers on your designated Care Team at your next follow up: [name] Kitchen Dr Glori Bickers . Dr Loralie Champagne . Darrick Grinder, NP

## 2018-12-27 ENCOUNTER — Telehealth (HOSPITAL_COMMUNITY): Payer: Self-pay | Admitting: Licensed Clinical Social Worker

## 2018-12-27 ENCOUNTER — Telehealth (HOSPITAL_COMMUNITY): Payer: Self-pay | Admitting: Surgery

## 2018-12-27 NOTE — Telephone Encounter (Signed)
Patient referred to the [facility].  I have sent all appropriate paperwork via secure email to Paramedic team.

## 2018-12-27 NOTE — Telephone Encounter (Signed)
Paramedicine Initial Assessment:  Housing:  In what kind of housing do you live? House/apt/trailer/shelter? House- but will moving to a new home in about 2 weeks  Do you rent/pay a mortgage/own? rent  Do you live with anyone? Currently living with her daughter but will be moving in with another family member when she goes to her new home.  Are you currently worried about losing your housing? no  Within the past 12 months have you ever stayed outside, in a car, tent, a shelter, or temporarily with someone? No (but states she had been homeless in her past)  Within the past 12 months have you been unable to get utilities when it was really needed? no  Social:  What is your current marital status? single  Do you have any children? daughter  Do you have family or friends who live locally? daughter and son in law- reports they are her main support  Food:  Within the past 12 months were you ever worried that food would run out before you got money to buy more? Yes - before the pandemic- hasn't been an issue since food stamps got expanded.  Within the past 68months have you run out of food and didn't have money to buy more? no  Income:  What is your current source of income? SSI  How hard is it for you to pay for the basics like food housing, medical care, and utilities? Not very hard- states the bills are usually low  Do you have outstanding medical bills? no  Insurance:  Are you currently insured? yes  Do you have prescription coverage? yes  If no insurance, have you applied for coverage (Medicaid, disability, marketplace etc)? NA  Transportation:  Do you have transportation to your medical appointments? yes   If yes, how? A transportation service- states through a mental health provider? Mr. Simona Huh [phone]  In the past 12 months has lack of transportation kept you from medical appts or from getting medications? no  In the past 12 months has lack of transportation kept  you from meetings, work, or getting things you needed? no   Daily Health Needs: Do you have a working scale at home? yes  How do you manage your medications at home? Leave them in the bottle- take them all out of the bag then place them back in the bag as she takes them.  Do you ever take your medications differently than prescribed? no  Do you have issues affording your medications? Sometimes hard but copays only about $3  If yes, has this ever prevented you from obtaining medications? no  Do you have any concerns with mobility at home? yes  Do you use any assistive devices at home or have PCS at home? Wheelchair and walker.  No PCS services.   Are there any additional barriers you see to getting the care you need?  CSW will continue to follow through paramedicine program and assist as needed.

## 2018-12-27 NOTE — Telephone Encounter (Signed)
Patient called CSW to request help with getting medical records for social security.  Pt states she got a notice from social security regarding a review of her disability so she needs to prove she is still disabled.  Patient read notice to CSW and notice just was informing patient that she is still deemed disabled but they could review this in the future but would reach out to patient ahead of time to inform.  CSW had pt call social security to confirm they are not actively reviewing her- pt called and they confirmed they are not actively reviewing but would in the future.  Patient would like to proceed with getting medical records so she is ready when they tell her they are doing a review- CSW providing community paramedic with medical request form so pt can complete and we can submit on her behalf.  CSW will continue to follow and assist as needed  Jorge Ny, [facility] Desk#: [phone] Cell#: [phone]

## 2019-01-01 ENCOUNTER — Telehealth: Payer: Self-pay | Admitting: Internal Medicine

## 2019-01-01 ENCOUNTER — Other Ambulatory Visit: Payer: Self-pay | Admitting: Family Medicine

## 2019-01-01 DIAGNOSIS — Z91199 Patient's noncompliance with other medical treatment and regimen due to unspecified reason: Secondary | ICD-10-CM

## 2019-01-01 DIAGNOSIS — Z9119 Patient's noncompliance with other medical treatment and regimen: Secondary | ICD-10-CM

## 2019-01-01 DIAGNOSIS — E1122 Type 2 diabetes mellitus with diabetic chronic kidney disease: Secondary | ICD-10-CM

## 2019-01-01 DIAGNOSIS — Z794 Long term (current) use of insulin: Secondary | ICD-10-CM

## 2019-01-01 DIAGNOSIS — N183 Chronic kidney disease, stage 3 unspecified: Secondary | ICD-10-CM

## 2019-01-01 MED ORDER — INSULIN DETEMIR 100 UNIT/ML ~~LOC~~ SOLN: SUBCUTANEOUS | 0 refills | Status: DC

## 2019-01-01 MED ORDER — NOVOLOG FLEXPEN 100 UNIT/ML ~~LOC~~ SOPN: PEN_INJECTOR | SUBCUTANEOUS | 0 refills | Status: DC

## 2019-01-01 NOTE — Telephone Encounter (Signed)
1) Medication(s) Requested (by name): INSULIN & NOVOLOG & LEVIMIR & NASAL SPRAY & SPIRONOLACTONE  2) Pharmacy of Choice: [facility] [address]-they deliver to her home  3) Special Requests:--BLOOD SUGAR 300 Right NOW----  216 at waking up. Need meds ASAP .  [facility]CP.  Switching to [facility] 01/19/19.  TRIAGE FOR BLOOD SUGAR LEVELS    Approved medications will be sent to the pharmacy, we will reach out if there is an issue.  Requests made after 3pm may not be addressed until the following business day!  If a patient is unsure of the name of the medication(s) please note and ask patient to call back when they are able to provide all info, do not send to responsible party until all information is available!

## 2019-01-01 NOTE — Progress Notes (Signed)
Patient ID: Tamara Silva, female   DOB: September 19, 1960, 58 y.o.   MRN: [id number]   Patient with phone call to office that she is out of insulin as she has been taking Novolog 45 units 3 times per day per a sliding scale. She has remained at 12 units of Levimir as per recent hospital discharge but has not come in for follow-up appointment or seen endocrinology to whom she has been referred. Nurse is to contact patient to find out how her sugars are running, where did she obtain the sliding scale that she is using (patient was previously made aware that her regular insulin was stopped during a hospitalization earlier this year). Multiple efforts have been made to help patient with compliance with medications as well as with follow-up of her chronic medical issues. I do believe that she might be best served in a PACE program where she can receive home care and more regular primary care follow-up or an entity such as [facility] which provides transportation. Patient was referred to a community program to have an EMT check-in of her and agreed to participate then would not allow the EMT access to her home.  -I will send in 30 day supplies of long acting and short acting insulin. Patient has upcoming appointment with another provider at this office later this month. Referral was again placed to endocrinology. Levemir increased to 20 units once per day in the morning and 10 units pre-meal three times daily and patient is keep follow-up appointment later this month.

## 2019-01-01 NOTE — Telephone Encounter (Signed)
Please contact patient and see where she obtain the sliding scale for regular insulin.  Patient was referred to endocrinology on 12/04/2018, please find out when her appointment with endocrinology is scheduled so that she can keep his follow-up appointment.  Please find out from the patient how her home blood sugars are running so that her Levemir dose can be adjusted to more adequately control her blood sugars.  It also appears that she has a follow-up appointment on 01/19/2019 with Dr. Wynetta Emery.

## 2019-01-01 NOTE — Telephone Encounter (Signed)
Patient call returned for f/u. States she does not have any medication for DM, Pharmacy told her she should have enough insulin to last 100 days. Patient states she is on a sliding scale and and Levemir 12 u HS is not holding her.   Advised to drink plenty of water ( 6 cups recommendations fluid restriction) and ambulate if tolerable. Advised on diet choices for the evening.   Called patient  pharmacy to validate the refills. Pharmacist states he spoke to patient and patient reports using Novolog 45 units tid according to sliding scale  Needs new Rx for Levimir .12 u HS  Needs Rx for pen needles 1 qid

## 2019-01-02 ENCOUNTER — Other Ambulatory Visit (HOSPITAL_COMMUNITY): Payer: Self-pay | Admitting: *Deleted

## 2019-01-02 ENCOUNTER — Telehealth: Payer: Self-pay | Admitting: Nurse Practitioner

## 2019-01-02 ENCOUNTER — Other Ambulatory Visit (HOSPITAL_COMMUNITY): Payer: Self-pay

## 2019-01-02 ENCOUNTER — Telehealth (HOSPITAL_COMMUNITY): Payer: Self-pay

## 2019-01-02 MED ORDER — ASPIRIN EC 81 MG PO TBEC: 81.0000 mg | DELAYED_RELEASE_TABLET | Freq: Every day | ORAL | 3 refills | Status: DC

## 2019-01-02 NOTE — Progress Notes (Signed)
Paramedicine Encounter    Patient ID: Tamara Silva, female    DOB: May 10, 1961, 58 y.o.   MRN: [id number]   Patient Care Team: Antony Blackbird, MD as PCP - General (Family Medicine) Lorretta Harp, MD as PCP - Cardiology (Cardiology) Larey Dresser, MD as PCP - Advanced Heart Failure (Cardiology)  Patient Active Problem List   Diagnosis Date Noted  . CHF exacerbation ([place]) 11/17/2018  . Cardiac arrest ([place]) 10/28/2018  . Seizure ([place]) 10/28/2018  . Noncompliance with treatment plan 07/24/2018  . General patient noncompliance 07/24/2018  . Noncompliance with diet and medication regimen 07/24/2018  . Dysarthria   . Morbid obesity with BMI of 60.0-69.9, adult ([place])   . BiPAP (biphasic positive airway pressure) dependence   . Acute renal failure superimposed on stage 3 chronic kidney disease ([place]) 07/08/2018  . Pancytopenia ([place]) 07/08/2018  . Type II diabetes mellitus with renal manifestations ([place]) 07/08/2018  . Acute on chronic combined systolic and diastolic CHF (congestive heart failure) ([place]) 06/26/2018  . Hepatic steatosis 06/22/2018  . Chronic respiratory failure with hypoxia and hypercapnia (HCC)   . Hyperammonemia ([place])   . Acute on chronic diastolic CHF (congestive heart failure) ([place])   . On home O2   . Slow transit constipation   . Encephalopathy 06/09/2018  . Cocaine abuse ([place])   . Diabetes mellitus type 2 in obese ([place])   . Generalized tonic-clonic seizure ([place]) 06/05/2018  . Cerebral embolism with cerebral infarction 06/01/2018  . AKI (acute kidney injury) ([place]) 05/30/2018  . Pressure injury of skin 05/30/2018  . Acute on chronic anemia 05/29/2018  . Chronic respiratory failure with hypoxia ([place]) 01/27/2018  . Iron deficiency anemia 01/27/2018  . Tobacco abuse disorder 10/03/2017  . Dyslipidemia 04/29/2016  . Primary insomnia 04/29/2016  . Hypoxemia 09/08/2015  . Congestive heart failure (CHF) ([place]) 09/08/2015  . Obesity hypoventilation syndrome ([place])   .  Microcytic anemia   . Acute encephalopathy   . Uncontrolled type 2 diabetes mellitus with hyperosmolarity without coma ([place])   . Acute on chronic respiratory failure with hypercapnia ([place])   . CAP (community acquired pneumonia)   . OSA (obstructive sleep apnea)   . Acute and chr resp failure, unsp w hypoxia or hypercapnia ([place]) 06/22/2015  . Acute on chronic combined systolic (congestive) and diastolic (congestive) heart failure ([place]) 06/21/2015  . Hypoglycemia 06/21/2015  . Uncontrolled type 2 diabetes mellitus without complication, with long-term current use of insulin ([place])   . Dyslipidemia associated with type 2 diabetes mellitus ([place]) 05/21/2015  . Hyperkalemia 05/21/2015  . COPD GOLD III  06/10/2014  . Acute respiratory failure with hypoxia ([place]) 03/04/2014  . CKD (chronic kidney disease), stage II 02/07/2014  . COPD exacerbation ([place]) 02/01/2014  . CAD S/P percutaneous coronary angioplasty - prior PCI to LAD; RCA PCI: new Xience Alpine DES 2.75 mm x 15 mm  10/07/2013  . Morbid obesity ([place]) 07/30/2013  . Chronic combined systolic and diastolic heart failure ([place]) 07/10/2013  . Chronic pulmonary embolism ([place]) 02/08/2013  . Bipolar disorder ([place]) 02/08/2013  . Essential hypertension 02/08/2013  . Chronic anticoagulation 11/27/2012  . Edema extremities 10/15/2012  . COPD (chronic obstructive pulmonary disease) ([place]) 10/15/2012  . Pulmonary embolism on right ([place]) 10/15/2012  . Hypertension 10/15/2012  . Insulin dependent diabetes mellitus ([place]) 10/15/2012    Current Outpatient Medications:  .  ACCU-CHEK AVIVA PLUS test strip, USE 1 STRIP TO CHECK GLUCOSE 3 TIMES DAILY (Patient taking differently: 1 each by Other route 3 (  three) times daily. ), Disp: 100 each, Rfl: 2 .  albuterol (PROAIR HFA) 108 (90 Base) MCG/ACT inhaler, INHALE 2 PUFFS BY MOUTH 4 TIMES DAILY AS NEEDED, Disp: 18 g, Rfl: 11 .  carvedilol (COREG) 3.125 MG tablet, TAKE 1 TABLET (3.125 MG TOTAL) BY MOUTH 2 (TWO)  TIMES DAILY WITH A MEAL., Disp: 60 tablet, Rfl: 2 .  ferrous sulfate 324 MG TBEC, Take 324 mg by mouth., Disp: , Rfl:  .  hydrocerin (EUCERIN) CREA, Apply 1 application topically 2 (two) times daily., Disp: , Rfl: 0 .  insulin aspart (NOVOLOG FLEXPEN) 100 UNIT/ML FlexPen, Inject 10 units three times daily before each meal, Disp: 15 mL, Rfl: 0 .  insulin detemir (LEVEMIR) 100 UNIT/ML injection, Inject 20 units [place]/under the skin once each morning, Disp: 10 mL, Rfl: 0 .  omeprazole (PRILOSEC) 40 MG capsule, Take 1 capsule (40 mg total) by mouth daily., Disp: 90 capsule, Rfl: 3 .  potassium chloride SA (K-DUR) 20 MEQ tablet, Take 1 tablet (20 mEq total) by mouth daily., Disp: 30 tablet, Rfl: 3 .  rosuvastatin (CRESTOR) 40 MG tablet, Take 1 tablet (40 mg total) by mouth daily at 6 PM., Disp: 30 tablet, Rfl: 4 .  spironolactone (ALDACTONE) 25 MG tablet, Take 1 tablet (25 mg total) by mouth daily., Disp: 90 tablet, Rfl: 1 .  torsemide (DEMADEX) 20 MG tablet, Take 2 tablets (40 mg total) by mouth every evening AND 3 tablets (60 mg total) every morning., Disp: 150 tablet, Rfl: 3 .  aspirin EC 81 MG tablet, Take 1 tablet (81 mg total) by mouth daily., Disp: 90 tablet, Rfl: 3 .  ferrous gluconate (FERGON) 324 MG tablet, Take 1 tablet (324 mg total) by mouth daily with breakfast. Office visit needed prior to future refills (Patient not taking: Reported on 12/26/2018), Disp: 30 tablet, Rfl: 0 .  ipratropium-albuterol (DUONEB) 0.5-2.5 (3) MG/3ML SOLN, Use nebulizer to inhale contents of one vial every 6 hours as needed for shortness of breath, wheezing, Disp: 360 mL, Rfl: 11 .  isosorbide-hydrALAZINE (BIDIL) 20-37.5 MG tablet, Take 0.5 tablets by mouth 3 (three) times daily., Disp: 45 tablet, Rfl: 3 .  NEEDLE, DISP, 26 G 26G X 1/2" MISC, 1 application by Does not apply route daily., Disp: 100 each, Rfl: 0 .  polyethylene glycol (MIRALAX) 17 g packet, Take 17 g by mouth every Monday, Wednesday, and Friday., Disp: 14  each, Rfl: 11 .  senna-docusate (SENOKOT-S) 8.6-50 MG tablet, Take 1 tablet by mouth 2 (two) times daily. If needed for constipation (Patient not taking: Reported on 11/30/2018), Disp: 60 tablet, Rfl: 4 .  umeclidinium-vilanterol (ANORO ELLIPTA) 62.5-25 MCG/INH AEPB, Inhale 1 puff into the lungs daily., Disp: 1 each, Rfl: 11 Allergies  Allergen Reactions  . Metolazone Other (See Comments)    Dizziness and falling  . Plavix [Clopidogrel Bisulfate] Other (See Comments)    High PRU's - non-responder  . Ibuprofen Other (See Comments)    Pt states she is not supposed to take ibuprofen because of other meds she is taking  . Nsaids Other (See Comments)    "I do not take NSAIDs d/t interfering with other meds"      Social History   Socioeconomic History  . Marital status: Single    Spouse name: Not on file  . Number of children: 1  . Years of education: Not on file  . Highest education level: Not on file  Occupational History  . Not on file  Social Needs  .  Financial resource strain: Somewhat hard  . Food insecurity    Worry: Sometimes true    Inability: Sometimes true  . Transportation needs    Medical: Yes    Non-medical: Yes  Tobacco Use  . Smoking status: Former Smoker    Packs/day: 0.50    Years: 20.00    Pack years: 10.00    Types: Cigarettes    Quit date: 04/30/2014    Years since quitting: 4.6  . Smokeless tobacco: Never Used  . Tobacco comment: smoked off and on x 20 years  Substance and Sexual Activity  . Alcohol use: No    Alcohol/week: 0.0 standard drinks  . Drug use: Not Currently    Types: Cocaine  . Sexual activity: Not Currently  Lifestyle  . Physical activity    Days per week: 0 days    Minutes per session: 0 min  . Stress: Rather much  Relationships  . Social Herbalist on phone: Twice a week    Gets together: Once a week    Attends religious service: 1 to 4 times per year    Active member of club or organization: No    Attends meetings  of clubs or organizations: Never    Relationship status: Living with partner  . Intimate partner violence    Fear of current or ex partner: Patient refused    Emotionally abused: Patient refused    Physically abused: Patient refused    Forced sexual activity: Patient refused  Other Topics Concern  . Not on file  Social History Narrative   Origibnally from [place]   Most recently from [place]   Daughter lives in town      On Social security to CHF, COPD, CAD    Physical Exam Cardiovascular:     Rate and Rhythm: Normal rate and regular rhythm.     Pulses: Normal pulses.  Pulmonary:     Effort: Pulmonary effort is normal.     Breath sounds: Normal breath sounds.  Abdominal:     General: There is no distension.  Musculoskeletal: Normal range of motion.     Right lower leg: No edema.     Left lower leg: No edema.  Skin:    General: Skin is warm and dry.     Capillary Refill: Capillary refill takes less than 2 seconds.  Neurological:     Mental Status: She is alert and oriented to person, place, and time.  Psychiatric:        Mood and Affect: Mood normal.         Future Appointments  Date Time Provider [place]  01/19/2019 10:50 AM Ladell Pier, MD [facility] None  02/07/2019 11:20 AM Larey Dresser, MD [facility] None  02/27/2019 11:30 AM Lorretta Harp, MD [facility]  04/03/2019  9:30 AM Elayne Snare, MD [facility] None    BP 106/64 (BP Location: Left Arm, Patient Position: Sitting, Cuff Size: Normal)   Pulse 70   Resp 16   Wt 274 lb 6.4 oz (124.5 kg)   LMP 07/28/2013 Comment: perimenopausal  SpO2 99%   BMI 42.98 kg/m   Weight yesterday- n/a Last visit weight- n/a  Ms Laton was seen at home today for our initial encounter. She reported feeling well, denying chest pain, SOB, headache, dizziness, orthopnea, cough or fever. She stated she has been compliant with her medications but prefers to take medications out of the bottle because  the pillbox confuses her. A review  of her medications showed a surplus of some which she said was because she combined older bottles into new ones. I spent a significant time reviewing her medical history and educating her about heart failure and how it is managed. Ms Teasdale took detailed notes and seems to have a general understanding about her condition after we finished our visit. She advised she does not weigh herself daily because she does not like to see how much she weighs however we discussed the importance of daily weights and she was understanding and agreeable. We discussed our next meeting and agreed to meet at 13:00 next Tuesday.   Jacquiline Doe, EMT 01/02/19  ACTION: Home visit completed Next visit planned for 1 week

## 2019-01-02 NOTE — Telephone Encounter (Signed)
Gm  Patient has an appointment at [facility]  04/03/2019 @ 9:30am  Dr Dwyane Dee

## 2019-01-02 NOTE — Telephone Encounter (Signed)
I called Tamara Silva to introduce myself and schedule an initial appointment. She stated she would be available this afternoon so we agreed to meet at 13:00.

## 2019-01-02 NOTE — Telephone Encounter (Signed)
Does not remember where the sliding scale was received.   Blood sugars were noted and recorded in 250-300, fluctuating to 90- 150.  Depending on what she ate that day.   This morning her blood sugar is 184. Patient informed of the changes made to her insulins. Has questions about taking the Levemir in the a.m. She took 12 units last night. Advised that the dose is 20 units in the a.m. Pt verbalized understanding.   Mrs. Raul Del, FNP will contact patient to go over further concerns the patient has about her medications.   Patient was reminded about her appointment with Dr. Dwyane Dee. Given address and appointment. Patient verbalized understanding.

## 2019-01-03 NOTE — Telephone Encounter (Signed)
error 

## 2019-01-03 NOTE — Telephone Encounter (Signed)
I left patient a lengthy voicemail before lunchtime on 01-02-2019. I gave her my personal office number to call. Never received call back.

## 2019-01-09 ENCOUNTER — Telehealth (HOSPITAL_COMMUNITY): Payer: Self-pay

## 2019-01-09 NOTE — Telephone Encounter (Signed)
Tamara Silva called me today ahead of her scheduled appointment. She stated she wasn't feeling well and wanted to postpone. I explained that it may be better for me to see her if she is feeling bad to see if she has any issues which we can address. She stated she was just using the bathroom a lot and did not want a visitor. She asked that I come Friday instead and we agreed to meet at 09:30.

## 2019-01-11 ENCOUNTER — Ambulatory Visit: Payer: Medicaid Other | Admitting: Family Medicine

## 2019-01-12 ENCOUNTER — Other Ambulatory Visit (HOSPITAL_COMMUNITY): Payer: Self-pay

## 2019-01-12 NOTE — Progress Notes (Signed)
Paramedicine Encounter    Patient ID: Tamara Silva, female    DOB: May 10, 1961, 58 y.o.   MRN: [id number]   Patient Care Team: Antony Blackbird, MD as PCP - General (Family Medicine) Lorretta Harp, MD as PCP - Cardiology (Cardiology) Larey Dresser, MD as PCP - Advanced Heart Failure (Cardiology)  Patient Active Problem List   Diagnosis Date Noted  . CHF exacerbation ([place]) 11/17/2018  . Cardiac arrest ([place]) 10/28/2018  . Seizure ([place]) 10/28/2018  . Noncompliance with treatment plan 07/24/2018  . General patient noncompliance 07/24/2018  . Noncompliance with diet and medication regimen 07/24/2018  . Dysarthria   . Morbid obesity with BMI of 60.0-69.9, adult ([place])   . BiPAP (biphasic positive airway pressure) dependence   . Acute renal failure superimposed on stage 3 chronic kidney disease ([place]) 07/08/2018  . Pancytopenia ([place]) 07/08/2018  . Type II diabetes mellitus with renal manifestations ([place]) 07/08/2018  . Acute on chronic combined systolic and diastolic CHF (congestive heart failure) ([place]) 06/26/2018  . Hepatic steatosis 06/22/2018  . Chronic respiratory failure with hypoxia and hypercapnia (HCC)   . Hyperammonemia ([place])   . Acute on chronic diastolic CHF (congestive heart failure) ([place])   . On home O2   . Slow transit constipation   . Encephalopathy 06/09/2018  . Cocaine abuse ([place])   . Diabetes mellitus type 2 in obese ([place])   . Generalized tonic-clonic seizure ([place]) 06/05/2018  . Cerebral embolism with cerebral infarction 06/01/2018  . AKI (acute kidney injury) ([place]) 05/30/2018  . Pressure injury of skin 05/30/2018  . Acute on chronic anemia 05/29/2018  . Chronic respiratory failure with hypoxia ([place]) 01/27/2018  . Iron deficiency anemia 01/27/2018  . Tobacco abuse disorder 10/03/2017  . Dyslipidemia 04/29/2016  . Primary insomnia 04/29/2016  . Hypoxemia 09/08/2015  . Congestive heart failure (CHF) ([place]) 09/08/2015  . Obesity hypoventilation syndrome ([place])   .  Microcytic anemia   . Acute encephalopathy   . Uncontrolled type 2 diabetes mellitus with hyperosmolarity without coma ([place])   . Acute on chronic respiratory failure with hypercapnia ([place])   . CAP (community acquired pneumonia)   . OSA (obstructive sleep apnea)   . Acute and chr resp failure, unsp w hypoxia or hypercapnia ([place]) 06/22/2015  . Acute on chronic combined systolic (congestive) and diastolic (congestive) heart failure ([place]) 06/21/2015  . Hypoglycemia 06/21/2015  . Uncontrolled type 2 diabetes mellitus without complication, with long-term current use of insulin ([place])   . Dyslipidemia associated with type 2 diabetes mellitus ([place]) 05/21/2015  . Hyperkalemia 05/21/2015  . COPD GOLD III  06/10/2014  . Acute respiratory failure with hypoxia ([place]) 03/04/2014  . CKD (chronic kidney disease), stage II 02/07/2014  . COPD exacerbation ([place]) 02/01/2014  . CAD S/P percutaneous coronary angioplasty - prior PCI to LAD; RCA PCI: new Xience Alpine DES 2.75 mm x 15 mm  10/07/2013  . Morbid obesity ([place]) 07/30/2013  . Chronic combined systolic and diastolic heart failure ([place]) 07/10/2013  . Chronic pulmonary embolism ([place]) 02/08/2013  . Bipolar disorder ([place]) 02/08/2013  . Essential hypertension 02/08/2013  . Chronic anticoagulation 11/27/2012  . Edema extremities 10/15/2012  . COPD (chronic obstructive pulmonary disease) ([place]) 10/15/2012  . Pulmonary embolism on right ([place]) 10/15/2012  . Hypertension 10/15/2012  . Insulin dependent diabetes mellitus ([place]) 10/15/2012    Current Outpatient Medications:  .  ACCU-CHEK AVIVA PLUS test strip, USE 1 STRIP TO CHECK GLUCOSE 3 TIMES DAILY (Patient taking differently: 1 each by Other route 3 (  three) times daily. ), Disp: 100 each, Rfl: 2 .  albuterol (PROAIR HFA) 108 (90 Base) MCG/ACT inhaler, INHALE 2 PUFFS BY MOUTH 4 TIMES DAILY AS NEEDED, Disp: 18 g, Rfl: 11 .  aspirin EC 81 MG tablet, Take 1 tablet (81 mg total) by mouth daily., Disp: 90 tablet,  Rfl: 3 .  carvedilol (COREG) 3.125 MG tablet, TAKE 1 TABLET (3.125 MG TOTAL) BY MOUTH 2 (TWO) TIMES DAILY WITH A MEAL., Disp: 60 tablet, Rfl: 2 .  ferrous sulfate 324 MG TBEC, Take 324 mg by mouth., Disp: , Rfl:  .  hydrocerin (EUCERIN) CREA, Apply 1 application topically 2 (two) times daily., Disp: , Rfl: 0 .  insulin aspart (NOVOLOG FLEXPEN) 100 UNIT/ML FlexPen, Inject 10 units three times daily before each meal, Disp: 15 mL, Rfl: 0 .  insulin detemir (LEVEMIR) 100 UNIT/ML injection, Inject 20 units [place]/under the skin once each morning, Disp: 10 mL, Rfl: 0 .  ipratropium-albuterol (DUONEB) 0.5-2.5 (3) MG/3ML SOLN, Use nebulizer to inhale contents of one vial every 6 hours as needed for shortness of breath, wheezing, Disp: 360 mL, Rfl: 11 .  omeprazole (PRILOSEC) 40 MG capsule, Take 1 capsule (40 mg total) by mouth daily., Disp: 90 capsule, Rfl: 3 .  potassium chloride SA (K-DUR) 20 MEQ tablet, Take 1 tablet (20 mEq total) by mouth daily., Disp: 30 tablet, Rfl: 3 .  rosuvastatin (CRESTOR) 40 MG tablet, Take 1 tablet (40 mg total) by mouth daily at 6 PM., Disp: 30 tablet, Rfl: 4 .  spironolactone (ALDACTONE) 25 MG tablet, Take 1 tablet (25 mg total) by mouth daily., Disp: 90 tablet, Rfl: 1 .  torsemide (DEMADEX) 20 MG tablet, Take 2 tablets (40 mg total) by mouth every evening AND 3 tablets (60 mg total) every morning., Disp: 150 tablet, Rfl: 3 .  ferrous gluconate (FERGON) 324 MG tablet, Take 1 tablet (324 mg total) by mouth daily with breakfast. Office visit needed prior to future refills (Patient not taking: Reported on 01/12/2019), Disp: 30 tablet, Rfl: 0 .  isosorbide-hydrALAZINE (BIDIL) 20-37.5 MG tablet, Take 0.5 tablets by mouth 3 (three) times daily., Disp: 45 tablet, Rfl: 3 .  NEEDLE, DISP, 26 G 26G X 1/2" MISC, 1 application by Does not apply route daily., Disp: 100 each, Rfl: 0 .  polyethylene glycol (MIRALAX) 17 g packet, Take 17 g by mouth every Monday, Wednesday, and Friday. (Patient not  taking: Reported on 01/12/2019), Disp: 14 each, Rfl: 11 .  senna-docusate (SENOKOT-S) 8.6-50 MG tablet, Take 1 tablet by mouth 2 (two) times daily. If needed for constipation (Patient not taking: Reported on 11/30/2018), Disp: 60 tablet, Rfl: 4 .  umeclidinium-vilanterol (ANORO ELLIPTA) 62.5-25 MCG/INH AEPB, Inhale 1 puff into the lungs daily. (Patient not taking: Reported on 01/12/2019), Disp: 1 each, Rfl: 11 Allergies  Allergen Reactions  . Metolazone Other (See Comments)    Dizziness and falling  . Plavix [Clopidogrel Bisulfate] Other (See Comments)    High PRU's - non-responder  . Ibuprofen Other (See Comments)    Pt states she is not supposed to take ibuprofen because of other meds she is taking  . Nsaids Other (See Comments)    "I do not take NSAIDs d/t interfering with other meds"      Social History   Socioeconomic History  . Marital status: Single    Spouse name: Not on file  . Number of children: 1  . Years of education: Not on file  . Highest education level: Not on file  Occupational History  . Not on file  Social Needs  . Financial resource strain: Somewhat hard  . Food insecurity    Worry: Sometimes true    Inability: Sometimes true  . Transportation needs    Medical: Yes    Non-medical: Yes  Tobacco Use  . Smoking status: Former Smoker    Packs/day: 0.50    Years: 20.00    Pack years: 10.00    Types: Cigarettes    Quit date: 04/30/2014    Years since quitting: 4.7  . Smokeless tobacco: Never Used  . Tobacco comment: smoked off and on x 20 years  Substance and Sexual Activity  . Alcohol use: No    Alcohol/week: 0.0 standard drinks  . Drug use: Not Currently    Types: Cocaine  . Sexual activity: Not Currently  Lifestyle  . Physical activity    Days per week: 0 days    Minutes per session: 0 min  . Stress: Rather much  Relationships  . Social Herbalist on phone: Twice a week    Gets together: Once a week    Attends religious service: 1 to  4 times per year    Active member of club or organization: No    Attends meetings of clubs or organizations: Never    Relationship status: Living with partner  . Intimate partner violence    Fear of current or ex partner: Patient refused    Emotionally abused: Patient refused    Physically abused: Patient refused    Forced sexual activity: Patient refused  Other Topics Concern  . Not on file  Social History Narrative   Origibnally from [place]   Most recently from [place]    Daughter lives in town      On Social security to CHF, COPD, CAD    Physical Exam Cardiovascular:     Rate and Rhythm: Normal rate and regular rhythm.     Pulses: Normal pulses.  Pulmonary:     Effort: Pulmonary effort is normal.     Breath sounds: Normal breath sounds.  Abdominal:     General: There is no distension.  Musculoskeletal: Normal range of motion.     Right lower leg: No edema.     Left lower leg: No edema.  Skin:    General: Skin is warm and dry.     Capillary Refill: Capillary refill takes less than 2 seconds.  Neurological:     Mental Status: She is alert and oriented to person, place, and time.  Psychiatric:        Mood and Affect: Mood normal.         Future Appointments  Date Time Provider [place]  01/19/2019 10:50 AM Ladell Pier, MD [facility] None  02/07/2019 11:20 AM Larey Dresser, MD [facility] None  02/27/2019 11:30 AM Lorretta Harp, MD [facility]  04/03/2019  9:30 AM Elayne Snare, MD [facility] None   Blood pressure 102/65, pulse 66, resp. rate 16, weight 275 lb 9.6 oz (125 kg), last menstrual period 07/28/2013, SpO2 100 %.  Weight yesterday- 275 lb Last visit weight- 274 lb  Ms Rivard was seen at home today and reported feeling well. She denied chest pain, headache, dizziness, cough or fever since our last visit. She did stated she has some exertional dyspnea.and is sleeping with two pillow which nornal for her since returning home from  the hospital. Her medications were verified and she is not using a pillbox. I  will follow up next week.   Jacquiline Doe, EMT 01/12/19  ACTION: Home visit completed Next visit planned for 1 week

## 2019-01-19 ENCOUNTER — Ambulatory Visit (HOSPITAL_BASED_OUTPATIENT_CLINIC_OR_DEPARTMENT_OTHER): Payer: Medicaid Other | Admitting: Pharmacist

## 2019-01-19 ENCOUNTER — Telehealth: Payer: Self-pay | Admitting: *Deleted

## 2019-01-19 ENCOUNTER — Other Ambulatory Visit: Payer: Self-pay

## 2019-01-19 ENCOUNTER — Ambulatory Visit: Payer: Medicaid Other | Attending: Internal Medicine | Admitting: Internal Medicine

## 2019-01-19 ENCOUNTER — Encounter: Payer: Self-pay | Admitting: Internal Medicine

## 2019-01-19 ENCOUNTER — Telehealth (HOSPITAL_COMMUNITY): Payer: Self-pay

## 2019-01-19 VITALS — BP 102/67 | HR 70 | Temp 98.2°F | Resp 16 | Ht 67.5 in | Wt 275.0 lb

## 2019-01-19 DIAGNOSIS — E1142 Type 2 diabetes mellitus with diabetic polyneuropathy: Secondary | ICD-10-CM

## 2019-01-19 DIAGNOSIS — G4733 Obstructive sleep apnea (adult) (pediatric): Secondary | ICD-10-CM

## 2019-01-19 DIAGNOSIS — Z23 Encounter for immunization: Secondary | ICD-10-CM

## 2019-01-19 DIAGNOSIS — N183 Chronic kidney disease, stage 3 unspecified: Secondary | ICD-10-CM

## 2019-01-19 DIAGNOSIS — Z79899 Other long term (current) drug therapy: Secondary | ICD-10-CM

## 2019-01-19 DIAGNOSIS — J9611 Chronic respiratory failure with hypoxia: Secondary | ICD-10-CM

## 2019-01-19 DIAGNOSIS — E1165 Type 2 diabetes mellitus with hyperglycemia: Secondary | ICD-10-CM

## 2019-01-19 DIAGNOSIS — J449 Chronic obstructive pulmonary disease, unspecified: Secondary | ICD-10-CM | POA: Diagnosis not present

## 2019-01-19 DIAGNOSIS — I5042 Chronic combined systolic (congestive) and diastolic (congestive) heart failure: Secondary | ICD-10-CM

## 2019-01-19 DIAGNOSIS — Z87891 Personal history of nicotine dependence: Secondary | ICD-10-CM

## 2019-01-19 DIAGNOSIS — IMO0002 Reserved for concepts with insufficient information to code with codable children: Secondary | ICD-10-CM

## 2019-01-19 LAB — POCT GLYCOSYLATED HEMOGLOBIN (HGB A1C): HbA1c, POC (controlled diabetic range): 8.8 % — AB (ref 0.0–7.0)

## 2019-01-19 LAB — GLUCOSE, POCT (MANUAL RESULT ENTRY): POC Glucose: 126 mg/dl — AB (ref 70–99)

## 2019-01-19 MED ORDER — TRULICITY 0.75 MG/0.5ML ~~LOC~~ SOAJ: 0.7500 mg | SUBCUTANEOUS | 8 refills | Status: DC

## 2019-01-19 MED ORDER — ANORO ELLIPTA 62.5-25 MCG/INH IN AEPB: 1.0000 | INHALATION_SPRAY | Freq: Every day | RESPIRATORY_TRACT | 11 refills | Status: DC

## 2019-01-19 NOTE — Patient Instructions (Addendum)
Please give patient an appointment with the clinical pharmacist in 2 weeks for follow-up on blood sugars.   Once she started the Trulicity injection, cut back on the NovoLog to 5 units with meals.  Please continue to monitor your blood sugars 3 times a day before meals.  Please write down your numbers and bring them in in 2 weeks to meet with the clinical pharmacist.  Pneumococcal Polysaccharide Vaccine (PPSV23): What You Need to Know 1. Why get vaccinated? Pneumococcal polysaccharide vaccine (PPSV23) can prevent pneumococcal disease. Pneumococcal disease refers to any illness caused by pneumococcal bacteria. These bacteria can cause many types of illnesses, including pneumonia, which is an infection of the lungs. Pneumococcal bacteria are one of the most common causes of pneumonia. Besides pneumonia, pneumococcal bacteria can also cause:  Ear infections  Sinus infections  Meningitis (infection of the tissue covering the brain and spinal cord)  Bacteremia (bloodstream infection) Anyone can get pneumococcal disease, but children under 56 years of age, people with certain medical conditions, adults 44 years or older, and cigarette smokers are at the highest risk. Most pneumococcal infections are mild. However, some can result in long-term problems, such as brain damage or hearing loss. Meningitis, bacteremia, and pneumonia caused by pneumococcal disease can be fatal. 2. PPSV23 PPSV23 protects against 23 types of bacteria that cause pneumococcal disease. PPSV23 is recommended for:  All adults 27 years or older,  Anyone 2 years or older with certain medical conditions that can lead to an increased risk for pneumococcal disease. Most people need only one dose of PPSV23. A second dose of PPSV23, and another type of pneumococcal vaccine called PCV13, are recommended for certain high-risk groups. Your health care provider can give you more information. People 65 years or older should get a dose of  PPSV23 even if they have already gotten one or more doses of the vaccine before they turned 46. 3. Talk with your health care provider Tell your vaccine provider if the person getting the vaccine:  Has had an allergic reaction after a previous dose of PPSV23, or has any severe, life-threatening allergies. In some cases, your health care provider may decide to postpone PPSV23 vaccination to a future visit. People with minor illnesses, such as a cold, may be vaccinated. People who are moderately or severely ill should usually wait until they recover before getting PPSV23. Your health care provider can give you more information. 4. Risks of a vaccine reaction  Redness or pain where the shot is given, feeling tired, fever, or muscle aches can happen after PPSV23. People sometimes faint after medical procedures, including vaccination. Tell your provider if you feel dizzy or have vision changes or ringing in the ears. As with any medicine, there is a very remote chance of a vaccine causing a severe allergic reaction, other serious injury, or death. 5. What if there is a serious problem? An allergic reaction could occur after the vaccinated person leaves the clinic. If you see signs of a severe allergic reaction (hives, swelling of the face and throat, difficulty breathing, a fast heartbeat, dizziness, or weakness), call 9-1-1 and get the person to the nearest hospital. For other signs that concern you, call your health care provider. Adverse reactions should be reported to the Vaccine Adverse Event Reporting System (VAERS). Your health care provider will usually file this report, or you can do it yourself. Visit the VAERS website at www.vaers.SamedayNews.es or call 518-588-7893. VAERS is only for reporting reactions, and VAERS staff do not give  medical advice. 6. How can I learn more?  Ask your health care provider.  Call your local or state health department.  Contact the Centers for Disease Control and  Prevention (CDC): ? Call 310-771-2783 (1-800-CDC-INFO) or ? Visit CDC's website at http://hunter.com/ CDC Vaccine Information Statement PPSV23 Vaccine (03/29/2018) This information is not intended to replace advice given to you by your health care provider. Make sure you discuss any questions you have with your health care provider. Document Released: 03/14/2006 Document Revised: 09/05/2018 Document Reviewed: 12/27/2017 Elsevier Patient Education  [place].    Influenza Virus Vaccine injection (Fluarix) What is this medicine? INFLUENZA VIRUS VACCINE (in floo EN zuh VAHY ruhs vak SEEN) helps to reduce the risk of getting influenza also known as the flu. This medicine may be used for other purposes; ask your health care provider or pharmacist if you have questions. COMMON BRAND NAME(S): Fluarix, Fluzone What should I tell my health care provider before I take this medicine? They need to know if you have any of these conditions:  bleeding disorder like hemophilia  fever or infection  Guillain-Barre syndrome or other neurological problems  immune system problems  infection with the human immunodeficiency virus (HIV) or AIDS  low blood platelet counts  multiple sclerosis  an unusual or allergic reaction to influenza virus vaccine, eggs, chicken proteins, latex, gentamicin, other medicines, foods, dyes or preservatives  pregnant or trying to get pregnant  breast-feeding How should I use this medicine? This vaccine is for injection into a muscle. It is given by a health care professional. A copy of Vaccine Information Statements will be given before each vaccination. Read this sheet carefully each time. The sheet may change frequently. Talk to your pediatrician regarding the use of this medicine in children. Special care may be needed. Overdosage: If you think you have taken too much of this medicine contact a poison control center or emergency room at once. NOTE:  This medicine is only for you. Do not share this medicine with others. What if I miss a dose? This does not apply. What may interact with this medicine?  chemotherapy or radiation therapy  medicines that lower your immune system like etanercept, anakinra, infliximab, and adalimumab  medicines that treat or prevent blood clots like warfarin  phenytoin  steroid medicines like prednisone or cortisone  theophylline  vaccines This list may not describe all possible interactions. Give your health care provider a list of all the medicines, herbs, non-prescription drugs, or dietary supplements you use. Also tell them if you smoke, drink alcohol, or use illegal drugs. Some items may interact with your medicine. What should I watch for while using this medicine? Report any side effects that do not go away within 3 days to your doctor or health care professional. Call your health care provider if any unusual symptoms occur within 6 weeks of receiving this vaccine. You may still catch the flu, but the illness is not usually as bad. You cannot get the flu from the vaccine. The vaccine will not protect against colds or other illnesses that may cause fever. The vaccine is needed every year. What side effects may I notice from receiving this medicine? Side effects that you should report to your doctor or health care professional as soon as possible:  allergic reactions like skin rash, itching or hives, swelling of the face, lips, or tongue Side effects that usually do not require medical attention (report to your doctor or health care professional if they continue  or are bothersome):  fever  headache  muscle aches and pains  pain, tenderness, redness, or swelling at site where injected  weak or tired This list may not describe all possible side effects. Call your doctor for medical advice about side effects. You may report side effects to FDA at 1-800-FDA-1088. Where should I keep my  medicine? This vaccine is only given in a clinic, pharmacy, doctor's office, or other health care setting and will not be stored at home. NOTE: This sheet is a summary. It may not cover all possible information. If you have questions about this medicine, talk to your doctor, pharmacist, or health care provider.  2020 Elsevier/Gold Standard (2007-12-13 09:30:40)

## 2019-01-19 NOTE — Telephone Encounter (Signed)
I called Tamara Silva to schedule an appointment today.  I left a message on her voicemail, to let her know that Tamara Silva is off today and asked me to "check" on her today.  Her phone is going straight to voicemail.

## 2019-01-19 NOTE — Progress Notes (Signed)
Patient ID: Tamara Silva, female    DOB: May 14, 1961  MRN: [id number]  CC: for chronic ds management  Subjective: Tamara Silva is a 58 y.o. female who presents for chronic ds management.  Husband is with her Her concerns today include:  HTN: Combined chronic CHF, cardiac arrest, CAD, CVA, chronic PE, OHS, COPD, hypoxic respiratory failure on home O2 DM, obesity, CKD stage III, tobacco dependence, IDA (history GI bleed with endoscopies 07/2018 - poor prep of colon on c-scope) Vit B12 def,  Was seeing Dr. Chapman Fitch but decided to change because she was not pleased with care.  Too difficult to get in contact with Dr. Chapman Fitch when she needs things  CHF/CAD/HTN:  Limits salt.  No CP/SOB.  SOB sometimes when she lay down at nights if it is too hot. Air condition unit is  down the hall from bedroom. -has scale at home. Wgh daily.  Wgh has been stable.  Reports compliance with meds Followed by cardiology Dr. Aundra Dubin who she saw about 3 weeks ago.  Plan is for cardiac catheterization if creatinine stabilizes and drops lower  COPD/Tob/OSA/OHS:  Quit smoking beginning of this yr Uses ProAir BID Out of Anoro x 1 mth No chronic cough On 3.5 L O2 continuously Supposed to be on BiPAP at nights but admits that she has not been using it as much because it is uncomfortable and feels like the pressure is too much.  She tells me that she is tried a different mask in the past but that did not seem to help.  She was being followed by [facility]   DIABETES TYPE 2 Last A1C:   Results for orders placed or performed in visit on 01/19/19  POCT glucose (manual entry)  Result Value Ref Range   POC Glucose 126 (A) 70 - 99 mg/dl  POCT glycosylated hemoglobin (Hb A1C)  Result Value Ref Range   Hemoglobin A1C     HbA1c POC (<> result, manual entry)     HbA1c, POC (prediabetic range)     HbA1c, POC (controlled diabetic range) 8.8 (A) 0.0 - 7.0 %    Med Adherence:  [x]  Yes   -Levemir 20 units BID (written as  once a day but pt states she is taking BID for past few wks because BS have remained high.), Novolog 10 TID with meals Medication side effects:  []  Yes    [x]  No Home Monitoring?  [x]  Yes   TID Home glucose results range: morning range 236, 256, 268, 169.  Before lunch: 183, 363, 300, 131, 260.  Before dinner:  203, 201, 159, 333 Diet Adherence: [x]  Yes - tries as much as she can.     Exercise: []  Yes    []  No Hypoglycemic episodes?: [x]  Yes - occasionally.  Can feel it Numbness of the feet? [x]  Yes  -for few yrs Retinopathy hx? []  Yes    []  No Last eye exam: end of June at [facility].  She tells me that no diabetic retinopathy was seen Comments: Was referred to endocrinology by Dr. Chapman Fitch.  She has appt 04/03/2019 with endocrine  She has a form for SCAT that she needs completed but has not filled out Part A which she needs to do before turning in to [place]  Patient Active Problem List   Diagnosis Date Noted  . CHF exacerbation ([place]) 11/17/2018  . Cardiac arrest ([place]) 10/28/2018  . Seizure ([place]) 10/28/2018  . Noncompliance with treatment plan 07/24/2018  . General patient noncompliance  07/24/2018  . Noncompliance with diet and medication regimen 07/24/2018  . Dysarthria   . Morbid obesity with BMI of 60.0-69.9, adult ([place])   . BiPAP (biphasic positive airway pressure) dependence   . Acute renal failure superimposed on stage 3 chronic kidney disease ([place]) 07/08/2018  . Pancytopenia ([place]) 07/08/2018  . Type II diabetes mellitus with renal manifestations ([place]) 07/08/2018  . Acute on chronic combined systolic and diastolic CHF (congestive heart failure) ([place]) 06/26/2018  . Hepatic steatosis 06/22/2018  . Chronic respiratory failure with hypoxia and hypercapnia (HCC)   . Hyperammonemia ([place])   . Acute on chronic diastolic CHF (congestive heart failure) ([place])   . On home O2   . Slow transit constipation   . Encephalopathy 06/09/2018  . Cocaine abuse ([place])   . Diabetes mellitus type 2 in obese  ([place])   . Generalized tonic-clonic seizure ([place]) 06/05/2018  . Cerebral embolism with cerebral infarction 06/01/2018  . AKI (acute kidney injury) ([place]) 05/30/2018  . Pressure injury of skin 05/30/2018  . Acute on chronic anemia 05/29/2018  . Chronic respiratory failure with hypoxia ([place]) 01/27/2018  . Iron deficiency anemia 01/27/2018  . Tobacco abuse disorder 10/03/2017  . Dyslipidemia 04/29/2016  . Primary insomnia 04/29/2016  . Hypoxemia 09/08/2015  . Congestive heart failure (CHF) ([place]) 09/08/2015  . Obesity hypoventilation syndrome ([place])   . Microcytic anemia   . Acute encephalopathy   . Uncontrolled type 2 diabetes mellitus with hyperosmolarity without coma ([place])   . Acute on chronic respiratory failure with hypercapnia ([place])   . CAP (community acquired pneumonia)   . OSA (obstructive sleep apnea)   . Acute and chr resp failure, unsp w hypoxia or hypercapnia ([place]) 06/22/2015  . Acute on chronic combined systolic (congestive) and diastolic (congestive) heart failure ([place]) 06/21/2015  . Hypoglycemia 06/21/2015  . Uncontrolled type 2 diabetes mellitus without complication, with long-term current use of insulin ([place])   . Dyslipidemia associated with type 2 diabetes mellitus ([place]) 05/21/2015  . Hyperkalemia 05/21/2015  . COPD GOLD III  06/10/2014  . Acute respiratory failure with hypoxia ([place]) 03/04/2014  . CKD (chronic kidney disease), stage II 02/07/2014  . COPD exacerbation ([place]) 02/01/2014  . CAD S/P percutaneous coronary angioplasty - prior PCI to LAD; RCA PCI: new Xience Alpine DES 2.75 mm x 15 mm  10/07/2013  . Morbid obesity ([place]) 07/30/2013  . Chronic combined systolic and diastolic heart failure ([place]) 07/10/2013  . Chronic pulmonary embolism ([place]) 02/08/2013  . Bipolar disorder ([place]) 02/08/2013  . Essential hypertension 02/08/2013  . Chronic anticoagulation 11/27/2012  . Edema extremities 10/15/2012  . COPD (chronic obstructive pulmonary disease) ([place]) 10/15/2012  .  Pulmonary embolism on right ([place]) 10/15/2012  . Hypertension 10/15/2012  . Insulin dependent diabetes mellitus ([place]) 10/15/2012     Current Outpatient Medications on File Prior to Visit  Medication Sig Dispense Refill  . ACCU-CHEK AVIVA PLUS test strip USE 1 STRIP TO CHECK GLUCOSE 3 TIMES DAILY (Patient taking differently: 1 each by Other route 3 (three) times daily. ) 100 each 2  . albuterol (PROAIR HFA) 108 (90 Base) MCG/ACT inhaler INHALE 2 PUFFS BY MOUTH 4 TIMES DAILY AS NEEDED 18 g 11  . aspirin EC 81 MG tablet Take 1 tablet (81 mg total) by mouth daily. 90 tablet 3  . carvedilol (COREG) 3.125 MG tablet TAKE 1 TABLET (3.125 MG TOTAL) BY MOUTH 2 (TWO) TIMES DAILY WITH A MEAL. 60 tablet 2  . ferrous gluconate (FERGON) 324 MG tablet Take  1 tablet (324 mg total) by mouth daily with breakfast. Office visit needed prior to future refills (Patient not taking: Reported on 01/12/2019) 30 tablet 0  . ferrous sulfate 324 MG TBEC Take 324 mg by mouth.    . hydrocerin (EUCERIN) CREA Apply 1 application topically 2 (two) times daily.  0  . insulin aspart (NOVOLOG FLEXPEN) 100 UNIT/ML FlexPen Inject 10 units three times daily before each meal 15 mL 0  . insulin detemir (LEVEMIR) 100 UNIT/ML injection Inject 20 units [place]/under the skin once each morning 10 mL 0  . ipratropium-albuterol (DUONEB) 0.5-2.5 (3) MG/3ML SOLN Use nebulizer to inhale contents of one vial every 6 hours as needed for shortness of breath, wheezing 360 mL 11  . isosorbide-hydrALAZINE (BIDIL) 20-37.5 MG tablet Take 0.5 tablets by mouth 3 (three) times daily. 45 tablet 3  . NEEDLE, DISP, 26 G 26G X 1/2" MISC 1 application by Does not apply route daily. 100 each 0  . omeprazole (PRILOSEC) 40 MG capsule Take 1 capsule (40 mg total) by mouth daily. 90 capsule 3  . polyethylene glycol (MIRALAX) 17 g packet Take 17 g by mouth every Monday, Wednesday, and Friday. (Patient not taking: Reported on 01/12/2019) 14 each 11  . potassium chloride SA  (K-DUR) 20 MEQ tablet Take 1 tablet (20 mEq total) by mouth daily. 30 tablet 3  . rosuvastatin (CRESTOR) 40 MG tablet Take 1 tablet (40 mg total) by mouth daily at 6 PM. 30 tablet 4  . senna-docusate (SENOKOT-S) 8.6-50 MG tablet Take 1 tablet by mouth 2 (two) times daily. If needed for constipation (Patient not taking: Reported on 11/30/2018) 60 tablet 4  . spironolactone (ALDACTONE) 25 MG tablet Take 1 tablet (25 mg total) by mouth daily. 90 tablet 1  . torsemide (DEMADEX) 20 MG tablet Take 2 tablets (40 mg total) by mouth every evening AND 3 tablets (60 mg total) every morning. 150 tablet 3  . umeclidinium-vilanterol (ANORO ELLIPTA) 62.5-25 MCG/INH AEPB Inhale 1 puff into the lungs daily. (Patient not taking: Reported on 01/12/2019) 1 each 11   No current facility-administered medications on file prior to visit.     Allergies  Allergen Reactions  . Metolazone Other (See Comments)    Dizziness and falling  . Plavix [Clopidogrel Bisulfate] Other (See Comments)    High PRU's - non-responder  . Ibuprofen Other (See Comments)    Pt states she is not supposed to take ibuprofen because of other meds she is taking  . Nsaids Other (See Comments)    "I do not take NSAIDs d/t interfering with other meds"    Social History   Socioeconomic History  . Marital status: Single    Spouse name: Not on file  . Number of children: 1  . Years of education: Not on file  . Highest education level: Not on file  Occupational History  . Not on file  Social Needs  . Financial resource strain: Somewhat hard  . Food insecurity    Worry: Sometimes true    Inability: Sometimes true  . Transportation needs    Medical: Yes    Non-medical: Yes  Tobacco Use  . Smoking status: Former Smoker    Packs/day: 0.50    Years: 20.00    Pack years: 10.00    Types: Cigarettes    Quit date: 04/30/2014    Years since quitting: 4.7  . Smokeless tobacco: Never Used  . Tobacco comment: smoked off and on x 20 years   Substance  and Sexual Activity  . Alcohol use: No    Alcohol/week: 0.0 standard drinks  . Drug use: Not Currently    Types: Cocaine  . Sexual activity: Not Currently  Lifestyle  . Physical activity    Days per week: 0 days    Minutes per session: 0 min  . Stress: Rather much  Relationships  . Social Herbalist on phone: Twice a week    Gets together: Once a week    Attends religious service: 1 to 4 times per year    Active member of club or organization: No    Attends meetings of clubs or organizations: Never    Relationship status: Living with partner  . Intimate partner violence    Fear of current or ex partner: Patient refused    Emotionally abused: Patient refused    Physically abused: Patient refused    Forced sexual activity: Patient refused  Other Topics Concern  . Not on file  Social History Narrative   Origibnally from [place]   Most recently from [name]   Daughter lives in town      On Social security to CHF, COPD, CAD    Family History  Problem Relation Age of Onset  . Stroke Mother   . Hypertension Mother   . Colon cancer Father   . Diabetes Father   . Heart attack Father   . Sarcoidosis Other   . Breast cancer Paternal Aunt   . Emphysema Brother   . Lung disease Brother        Unknown type, 3 brothers, one with liver and lung disease  . Asthma Paternal Aunt     Past Surgical History:  Procedure Laterality Date  . BIOPSY  07/10/2018   Procedure: BIOPSY;  Surgeon: [name], MD;  Location: [facility];  Service: Gastroenterology;;  . COLONOSCOPY WITH PROPOFOL N/A 08/03/2013   Procedure: COLONOSCOPY WITH PROPOFOL;  Surgeon: Jeryl Columbia, MD;  Location: [facility];  Service: Endoscopy;  Laterality: N/A;  . COLONOSCOPY WITH PROPOFOL N/A 07/10/2018   Procedure: COLONOSCOPY WITH PROPOFOL;  Surgeon: [name], MD;  Location: [facility];  Service: Gastroenterology;  Laterality: N/A;  . CORONARY ANGIOPLASTY WITH STENT PLACEMENT     CAD in  2006 x 2 and 2009 2 more- place din REx in [place] and [place]  . CORONARY ANGIOPLASTY WITH STENT PLACEMENT  10/07/2013   Xience Alpine DES 2.75  mm x 15  mm  . ESOPHAGOGASTRODUODENOSCOPY (EGD) WITH PROPOFOL N/A 08/03/2013   Procedure: ESOPHAGOGASTRODUODENOSCOPY (EGD) WITH PROPOFOL;  Surgeon: Jeryl Columbia, MD;  Location: [facility];  Service: Endoscopy;  Laterality: N/A;  . ESOPHAGOGASTRODUODENOSCOPY (EGD) WITH PROPOFOL N/A 07/10/2018   Procedure: ESOPHAGOGASTRODUODENOSCOPY (EGD) WITH PROPOFOL;  Surgeon: [name], MD;  Location: [facility];  Service: Gastroenterology;  Laterality: N/A;  . LEFT HEART CATHETERIZATION WITH CORONARY ANGIOGRAM N/A 10/07/2013   Procedure: LEFT HEART CATHETERIZATION WITH CORONARY ANGIOGRAM;  Surgeon: Leonie Man, MD;  Location: [facility] CATH LAB;  Service: Cardiovascular;  Laterality: N/A;  . RIGHT HEART CATH N/A 11/06/2018   Procedure: RIGHT HEART CATH;  Surgeon: Larey Dresser, MD;  Location: [facility] CV LAB;  Service: Cardiovascular;  Laterality: N/A;    ROS: Review of Systems Negative except as stated above  PHYSICAL EXAM: BP 102/67   Pulse 70   Temp 98.2 F (36.8 C) (Oral)   Resp 16   Ht 5' 7.5" (1.715 m)   Wt 275 lb (124.7 kg)  LMP 07/28/2013 Comment: perimenopausal  SpO2 99%   BMI 42.44 kg/m   Wt Readings from Last 3 Encounters:  01/19/19 275 lb (124.7 kg)  01/12/19 275 lb 9.6 oz (125 kg)  01/02/19 274 lb 6.4 oz (124.5 kg)    .Physical Exam  General appearance - alert, well appearing, middle-aged older African-American female and in no distress.  Patient is sitting in a wheelchair.  She is wearing her oxygen. Mental status - normal mood, behavior, speech, dress, motor activity, and thought processes Mouth - mucous membranes moist, pharynx normal without lesions Neck - supple, no significant adenopathy Chest -breath sounds slightly decreased but with good air entry and no wheezes or crackles heard. Heart -regular rate rhythm.    Extremities -no lower extremity edema. Diabetic Foot Exam - Simple   Simple Foot Form Visual Inspection See comments: Yes Sensation Testing See comments: Yes Pulse Check See comments: Yes Comments She has a nonulcerative callus on the ball of the left fifth toe.  Decreased sensation on leap exam on the soles of both feet.  Dorsalis pedis pulses and posterior tibialis pulses 2+ bilaterally       CMP Latest Ref Rng & Units 12/26/2018 11/30/2018 11/20/2018  Glucose 70 - 99 mg/dL 155(H) 167(H) 144(H)  BUN 6 - 20 mg/dL 27(H) 44(H) 56(H)  Creatinine 0.44 - 1.00 mg/dL 1.56(H) 1.83(H) 2.02(H)  Sodium 135 - 145 mmol/L 140 141 138  Potassium 3.5 - 5.1 mmol/L 3.7 4.3 3.3(L)  Chloride 98 - 111 mmol/L 100 96 91(L)  CO2 22 - 32 mmol/L 30 30(H) 34(H)  Calcium 8.9 - 10.3 mg/dL 9.3 9.8 9.5  Total Protein 6.5 - 8.1 g/dL - - -  Total Bilirubin 0.3 - 1.2 mg/dL - - -  Alkaline Phos 38 - 126 U/L - - -  AST 15 - 41 U/L - - -  ALT 0 - 44 U/L - - -   Lipid Panel     Component Value Date/Time   CHOL 159 10/26/2018 1220   TRIG 72 10/31/2018 0511   HDL 40 (L) 10/26/2018 1220   CHOLHDL 4.0 10/26/2018 1220   VLDL 15 10/26/2018 1220   LDLCALC 104 (H) 10/26/2018 1220    CBC    Component Value Date/Time   WBC 9.3 11/30/2018 1346   WBC 7.9 11/17/2018 1655   RBC 4.31 11/30/2018 1346   RBC 3.86 (L) 11/17/2018 1655   HGB 11.2 11/30/2018 1346   HCT 34.6 11/30/2018 1346   PLT 358 11/30/2018 1346   MCV 80 11/30/2018 1346   MCH 26.0 (L) 11/30/2018 1346   MCH 26.2 11/17/2018 1655   MCHC 32.4 11/30/2018 1346   MCHC 30.9 11/17/2018 1655   RDW 15.4 11/30/2018 1346   LYMPHSABS 3.4 (H) 11/30/2018 1346   MONOABS 0.8 10/28/2018 1023   EOSABS 0.2 11/30/2018 1346   BASOSABS 0.1 11/30/2018 1346   Lab Results  Component Value Date   IRON 34 10/27/2018   TIBC 371 10/27/2018   FERRITIN 76 10/27/2018    ASSESSMENT AND PLAN: 1. Uncontrolled type 2 diabetes mellitus with peripheral neuropathy ([place])  -Discussed healthy eating habits. -Discussed trying her with Trulicity which will help with weight loss.  Patient is agreeable to once weekly Trulicity injection.  Advised to continue to monitor blood sugars carefully and decrease NovoLog to 5 units with meals.  If blood sugars before meals are staying below 130 she is started the Trulicity, then she can discontinue the NovoLog completely.  I will have her follow-up  with the clinical pharmacist in 2 weeks.  Advised to keep a log of blood sugars and bring them in on that visit. - POCT glucose (manual entry) - POCT glycosylated hemoglobin (Hb A1C) - Dulaglutide (TRULICITY) 3.00 TM/2.2QJ SOPN; Inject 0.75 mg into the skin once a week.  Dispense: 4 pen; Refill: 8  2. Chronic obstructive pulmonary disease, unspecified COPD type ([place]) Refill Anoro. We will have her follow-up with pulmonary.  Encouraged her to use the BiPAP but we will get her in with pulmonary to see whether she needs to have a new sleep study done - umeclidinium-vilanterol (ANORO ELLIPTA) 62.5-25 MCG/INH AEPB; Inhale 1 puff into the lungs daily.  Dispense: 1 each; Refill: 11 - Ambulatory referral to Pulmonology  3. Chronic hypoxemic respiratory failure (HCC) On home O2 3.5 Lt continuously - Ambulatory referral to Pulmonology  4. OSA treated with BiPAP See #2 above - Ambulatory referral to Pulmonology  5. Former smoker Commended her on quitting.  Encouraged her to remain tobacco free  6. Chronic combined systolic and diastolic congestive heart failure, NYHA class 2; EF 30-35% Followed by cardiology.  Currently she does not appear to be fluid overloaded.  Advised to continue current medications that include BiDil.  Torsemide.  Spironolactone.  Carvedilol  7. CKD (chronic kidney disease) stage 3, GFR 30-59 ml/min (HCC) Avoid NSAIDs.  8. Need for vaccination against Streptococcus pneumoniae Given  9. Need for immunization against influenza - Flu Vaccine QUAD 36+ mos IM     Patient was given the opportunity to ask questions.  Patient verbalized understanding of the plan and was able to repeat key elements of the plan.   No orders of the defined types were placed in this encounter.    Requested Prescriptions    No prescriptions requested or ordered in this encounter    No follow-ups on file.  Karle Plumber, MD, FACP

## 2019-01-19 NOTE — Progress Notes (Signed)
Patient was educated on the use of the Trulicity pen. Reviewed necessary supplies and operation of the pen. Also reviewed goal blood glucose levels. Patient was able to demonstrate use. All questions and concerns were addressed.

## 2019-01-19 NOTE — Telephone Encounter (Signed)
Faxed forms for patient to [facility] and information will be sent to scan center.

## 2019-01-23 ENCOUNTER — Telehealth (HOSPITAL_COMMUNITY): Payer: Self-pay

## 2019-01-25 ENCOUNTER — Telehealth: Payer: Self-pay

## 2019-01-25 ENCOUNTER — Other Ambulatory Visit: Payer: Self-pay | Admitting: Family Medicine

## 2019-01-25 DIAGNOSIS — I5042 Chronic combined systolic (congestive) and diastolic (congestive) heart failure: Secondary | ICD-10-CM

## 2019-01-25 NOTE — Telephone Encounter (Signed)
I HAVE A PA FOR HER TRULICITY FROM [facility] FOR MEDICAID,BUT IT LOOKS LIKE SHE HAS TO HAVE TRIED A METFORMIN PRODUCT, AND VICTOZA AND I DON'T SEE ANY OF THOSE IN HER CHART.  CAN YOU CHANGE HER THERAPY AND SEND IT TO [facility]?

## 2019-01-26 ENCOUNTER — Telehealth (HOSPITAL_COMMUNITY): Payer: Self-pay

## 2019-01-26 MED ORDER — LIRAGLUTIDE 18 MG/3ML ~~LOC~~ SOPN: PEN_INJECTOR | SUBCUTANEOUS | 3 refills | Status: DC

## 2019-01-26 NOTE — Addendum Note (Signed)
Addended by: Karle Plumber B on: 01/26/2019 09:00 AM   Modules accepted: Orders

## 2019-01-26 NOTE — Telephone Encounter (Signed)
Tamara Silva may you call pt. Pt already has an appointment scheduled with you 9/4

## 2019-01-26 NOTE — Telephone Encounter (Signed)
I called Tamara Silva to schedule an appointment. She did not answer so I left a message requesting she call me back.

## 2019-01-26 NOTE — Telephone Encounter (Addendum)
I called Tamara Silva to schedule an appointment. She stated she got my voicemail earlier in the week but was ad at me for "leaving her hanging" last week so she ignored it and didn't call me back. I asked what she meant by saying I left her hanging and she said I did not return her call at the end of last week. I explained that I was not working when she called and my partner had tried to call her and she did not answer then nor did she return that phone call. She stated she was asleep when [facility] called her last week and didn't check her voicemail until "a couple days later." I explained that she is responsible for returning messages left to her. She expressed understanding so I asked if I could come next week. She said I could only come on Friday afternoon because the kids in the house will be using various rooms for online learning. We agreed to meet at 14:00 next Friday. She denied needing anything before then but said she would call if something should occur before hand.

## 2019-01-29 ENCOUNTER — Telehealth: Payer: Self-pay | Admitting: Internal Medicine

## 2019-01-29 NOTE — Telephone Encounter (Signed)
Will forward to Luke  

## 2019-01-29 NOTE — Telephone Encounter (Signed)
New Message  Pt is calling states she is needing approval for her Trulicity or either an alternative medication. Please f/u

## 2019-01-30 ENCOUNTER — Telehealth: Payer: Self-pay

## 2019-01-30 NOTE — Telephone Encounter (Signed)
Attempted to contact the patient # [phone] to obtain additional information for SCAT application. Message left requesting a call back to this CM # [phone]

## 2019-01-30 NOTE — Telephone Encounter (Signed)
Informed pt that Dr. Wynetta Emery sent in [place].

## 2019-01-31 NOTE — Telephone Encounter (Signed)
Attempted again to contact the patient #  [phone] to discuss the SCAT application. Message left requesting a call back to this CM # [phone]

## 2019-02-02 ENCOUNTER — Other Ambulatory Visit: Payer: Self-pay | Admitting: Pharmacist

## 2019-02-02 ENCOUNTER — Ambulatory Visit: Payer: Medicaid Other | Admitting: Pharmacist

## 2019-02-02 ENCOUNTER — Other Ambulatory Visit (HOSPITAL_COMMUNITY): Payer: Self-pay

## 2019-02-02 DIAGNOSIS — Z91199 Patient's noncompliance with other medical treatment and regimen due to unspecified reason: Secondary | ICD-10-CM

## 2019-02-02 DIAGNOSIS — E1122 Type 2 diabetes mellitus with diabetic chronic kidney disease: Secondary | ICD-10-CM

## 2019-02-02 DIAGNOSIS — Z794 Long term (current) use of insulin: Secondary | ICD-10-CM

## 2019-02-02 DIAGNOSIS — Z9119 Patient's noncompliance with other medical treatment and regimen: Secondary | ICD-10-CM

## 2019-02-02 MED ORDER — INSULIN DETEMIR 100 UNIT/ML ~~LOC~~ SOLN: SUBCUTANEOUS | 2 refills | Status: DC

## 2019-02-02 NOTE — Progress Notes (Deleted)
    S:    PCP: Dr. Wynetta Emery  No chief complaint on file.   Patient arrives ***.  Presents for diabetes evaluation, education, and management Patient was referred and last seen by Primary Care Provider on 01/19/19. Dr. Wynetta Emery started Trulicity at that visit but this was changed to Victoza d/t Trulicity requiring a PA.   Patient reports Diabetes was diagnosed in ***.   Family/Social History:  - FHx: stroke, HTN (mother); DM, MI (father) - Tobacco: former smoker (quit in 2020) - Alcohol: ***  Insurance coverage/medication affordability: [place] Medicaid  Patient {Actions; denies-reports:120008} adherence with medications.  Current diabetes medications include: Levemir 20 units daily (reported taking BID d/t hyperglycemia at home); Novolog 5 units TID, Victoza *** Current hypertension medications include: carvedilol 3.125 mg BID, BiDil 20-37.5 (0.5 tab BID), spironolactone 25 mg daily, torsemide (2 tablets in the evening and 3 in the morning) Current hyperlipidemia medications include: Crestor 40 mg daily  Patient {Actions; denies-reports:120008} hypoglycemic events.  Patient reported dietary habits: Eats *** meals/day Breakfast:*** Lunch:*** Dinner:*** Snacks:*** Drinks:***  Patient-reported exercise habits: ***   Patient {Actions; denies-reports:120008} nocturia.  Patient reports neuropathy. Patient denies visual changes. Patient {Actions; denies-reports:120008} self foot exams.    O:  POCT glucose: ***  Lab Results  Component Value Date   HGBA1C 8.8 (A) 01/19/2019   There were no vitals filed for this visit.  Lipid Panel     Component Value Date/Time   CHOL 159 10/26/2018 1220   TRIG 72 10/31/2018 0511   HDL 40 (L) 10/26/2018 1220   CHOLHDL 4.0 10/26/2018 1220   VLDL 15 10/26/2018 1220   LDLCALC 104 (H) 10/26/2018 1220   Home fasting CBG: ***  2 hour post-prandial/random CBG: ***.  Clinical ASCVD: Yes  The ASCVD Risk score Mikey Bussing DC Jr., et al., 2013) failed to  calculate for the following reasons:   The patient has a prior MI or stroke diagnosis   A/P: Diabetes longstanding*** currently ***. Patient is not*** able to verbalize appropriate hypoglycemia management plan. Patient {Is/is not:9024} adherent with medication. Control is suboptimal due to ***. -{Meds adjust:18428} basal insulin *** (insulin ***). Patient will continue to titrate 1 unit every ***days if fasting CBGs > 100mg /dl until fasting CBGs reach goal or next visit.  -{Meds adjust:18428}  rapid insulin *** (insulin ***) to ***.  -{Meds adjust:18428} GLP-1 *** (generic name***) to ***.  -{Meds adjust:18428} SGLT2-I *** (generic name***) to ***. Counseled on sick day rules for ***. -Extensively discussed pathophysiology of DM, recommended lifestyle interventions, dietary effects on glycemic control -Counseled on s/sx of and management of hypoglycemia -Next A1C anticipated ***.    ASCVD risk - primary***secondary prevention in patient with DM. Last LDL is not controlled. ASCVD risk score {Is/is not:9024} >20%  - {Desc; low/moderate/high:110033} intensity statin indicated. Aspirin {Is/is not:9024} indicated.  -{Meds adjust:18428} aspirin *** mg  -{Meds adjust:18428} ***statin *** mg.   Hypertension longstanding*** currently ***.  BP goal = *** mmHg. Patient {Is/is not:9024} adherent with medication. Control is suboptimal due to ***. -***  Written patient instructions provided.  Total time in face to face counseling *** minutes.   Follow up Pharmacist/PCP*** Clinic Visit in ***.   Patient seen with ***

## 2019-02-02 NOTE — Progress Notes (Signed)
Paramedicine Encounter    Patient ID: Tamara Silva, female    DOB: February 17, 1961, 58 y.o.   MRN: [id number]   Patient Care Team: Ladell Pier, MD as PCP - General (Internal Medicine) Lorretta Harp, MD as PCP - Cardiology (Cardiology) Larey Dresser, MD as PCP - Advanced Heart Failure (Cardiology)  Patient Active Problem List   Diagnosis Date Noted  . Diabetic peripheral neuropathy ([place]) 01/19/2019  . Former smoker 01/19/2019  . CKD (chronic kidney disease) stage 3, GFR 30-59 ml/min (HCC) 01/19/2019  . Cardiac arrest ([place]) 10/28/2018  . Seizure ([place]) 10/28/2018  . Noncompliance with treatment plan 07/24/2018  . General patient noncompliance 07/24/2018  . Noncompliance with diet and medication regimen 07/24/2018  . Dysarthria   . BiPAP (biphasic positive airway pressure) dependence   . Pancytopenia ([place]) 07/08/2018  . Type II diabetes mellitus with renal manifestations ([place]) 07/08/2018  . Hepatic steatosis 06/22/2018  . Chronic respiratory failure with hypoxia and hypercapnia (HCC)   . On home O2   . Slow transit constipation   . Cocaine abuse ([place])   . Diabetes mellitus type 2 in obese ([place])   . Generalized tonic-clonic seizure ([place]) 06/05/2018  . Cerebral embolism with cerebral infarction 06/01/2018  . Pressure injury of skin 05/30/2018  . Iron deficiency anemia 01/27/2018  . Tobacco abuse disorder 10/03/2017  . Dyslipidemia 04/29/2016  . Primary insomnia 04/29/2016  . Obesity hypoventilation syndrome ([place])   . OSA (obstructive sleep apnea)   . Dyslipidemia associated with type 2 diabetes mellitus ([place]) 05/21/2015  . COPD GOLD III  06/10/2014  . CAD S/P percutaneous coronary angioplasty - prior PCI to LAD; RCA PCI: new Xience [place] DES 2.75 mm x 15 mm  10/07/2013  . Morbid obesity ([place]) 07/30/2013  . Chronic combined systolic and diastolic heart failure ([place]) 07/10/2013  . Chronic pulmonary embolism ([place]) 02/08/2013  . Bipolar disorder ([place]) 02/08/2013  .  Essential hypertension 02/08/2013  . COPD (chronic obstructive pulmonary disease) ([place]) 10/15/2012  . Pulmonary embolism on right ([place]) 10/15/2012    Current Outpatient Medications:  .  ACCU-CHEK AVIVA PLUS test strip, USE 1 STRIP TO CHECK GLUCOSE 3 TIMES DAILY (Patient taking differently: 1 each by Other route 3 (three) times daily. ), Disp: 100 each, Rfl: 2 .  albuterol (PROAIR HFA) 108 (90 Base) MCG/ACT inhaler, INHALE 2 PUFFS BY MOUTH 4 TIMES DAILY AS NEEDED, Disp: 18 g, Rfl: 11 .  aspirin EC 81 MG tablet, Take 1 tablet (81 mg total) by mouth daily., Disp: 90 tablet, Rfl: 3 .  carvedilol (COREG) 3.125 MG tablet, TAKE 1 TABLET BY MOUTH 2 TIMES DAILY WITH A MEAL., Disp: 60 tablet, Rfl: 2 .  ferrous gluconate (FERGON) 324 MG tablet, Take 1 tablet (324 mg total) by mouth daily with breakfast. Office visit needed prior to future refills (Patient not taking: Reported on 01/12/2019), Disp: 30 tablet, Rfl: 0 .  hydrocerin (EUCERIN) CREA, Apply 1 application topically 2 (two) times daily., Disp: , Rfl: 0 .  insulin aspart (NOVOLOG FLEXPEN) 100 UNIT/ML FlexPen, Inject 10 units three times daily before each meal, Disp: 15 mL, Rfl: 0 .  insulin detemir (LEVEMIR) 100 UNIT/ML injection, Inject 20 units [place]/under the skin once each morning, Disp: 10 mL, Rfl: 0 .  ipratropium-albuterol (DUONEB) 0.5-2.5 (3) MG/3ML SOLN, Use nebulizer to inhale contents of one vial every 6 hours as needed for shortness of breath, wheezing, Disp: 360 mL, Rfl: 11 .  isosorbide-hydrALAZINE (BIDIL) 20-37.5 MG tablet, Take 0.5 tablets  by mouth 3 (three) times daily., Disp: 45 tablet, Rfl: 3 .  liraglutide (VICTOZA) 18 MG/3ML SOPN, 0.6 ml subcut x 1 wk then increase to 1.2 ml daily, Disp: 3 mL, Rfl: 3 .  NEEDLE, DISP, 26 G 26G X 1/2" MISC, 1 application by Does not apply route daily., Disp: 100 each, Rfl: 0 .  omeprazole (PRILOSEC) 40 MG capsule, Take 1 capsule (40 mg total) by mouth daily., Disp: 90 capsule, Rfl: 3 .  potassium  chloride SA (K-DUR) 20 MEQ tablet, Take 1 tablet (20 mEq total) by mouth daily., Disp: 30 tablet, Rfl: 3 .  rosuvastatin (CRESTOR) 40 MG tablet, Take 1 tablet (40 mg total) by mouth daily at 6 PM., Disp: 30 tablet, Rfl: 4 .  spironolactone (ALDACTONE) 25 MG tablet, Take 1 tablet (25 mg total) by mouth daily., Disp: 90 tablet, Rfl: 1 .  torsemide (DEMADEX) 20 MG tablet, Take 2 tablets (40 mg total) by mouth every evening AND 3 tablets (60 mg total) every morning., Disp: 150 tablet, Rfl: 3 .  umeclidinium-vilanterol (ANORO ELLIPTA) 62.5-25 MCG/INH AEPB, Inhale 1 puff into the lungs daily., Disp: 1 each, Rfl: 11 .  vitamin B-12 (CYANOCOBALAMIN) 1000 MCG tablet, Take 1,000 mcg by mouth daily., Disp: , Rfl:  Allergies  Allergen Reactions  . Metolazone Other (See Comments)    Dizziness and falling  . Plavix [Clopidogrel Bisulfate] Other (See Comments)    High PRU's - non-responder  . Ibuprofen Other (See Comments)    Pt states she is not supposed to take ibuprofen because of other meds she is taking  . Nsaids Other (See Comments)    "I do not take NSAIDs d/t interfering with other meds"      Social History   Socioeconomic History  . Marital status: Single    Spouse name: Not on file  . Number of children: 1  . Years of education: Not on file  . Highest education level: Not on file  Occupational History  . Not on file  Social Needs  . Financial resource strain: Somewhat hard  . Food insecurity    Worry: Sometimes true    Inability: Sometimes true  . Transportation needs    Medical: Yes    Non-medical: Yes  Tobacco Use  . Smoking status: Former Smoker    Packs/day: 0.50    Years: 20.00    Pack years: 10.00    Types: Cigarettes    Quit date: 04/30/2014    Years since quitting: 4.7  . Smokeless tobacco: Never Used  . Tobacco comment: smoked off and on x 20 years  Substance and Sexual Activity  . Alcohol use: No    Alcohol/week: 0.0 standard drinks  . Drug use: Not Currently     Types: Cocaine  . Sexual activity: Not Currently  Lifestyle  . Physical activity    Days per week: 0 days    Minutes per session: 0 min  . Stress: Rather much  Relationships  . Social Herbalist on phone: Twice a week    Gets together: Once a week    Attends religious service: 1 to 4 times per year    Active member of club or organization: No    Attends meetings of clubs or organizations: Never    Relationship status: Living with partner  . Intimate partner violence    Fear of current or ex partner: Patient refused    Emotionally abused: Patient refused    Physically abused: Patient refused  Forced sexual activity: Patient refused  Other Topics Concern  . Not on file  Social History Narrative   Origibnally from [place]   Most recently from [place]   Daughter lives in town      On Social security to CHF, COPD, CAD    Physical Exam Cardiovascular:     Rate and Rhythm: Normal rate and regular rhythm.     Pulses: Normal pulses.  Pulmonary:     Effort: Pulmonary effort is normal.     Breath sounds: Normal breath sounds.  Abdominal:     General: There is no distension.  Musculoskeletal: Normal range of motion.     Right lower leg: No edema.     Left lower leg: No edema.  Skin:    General: Skin is warm and dry.     Capillary Refill: Capillary refill takes less than 2 seconds.  Neurological:     Mental Status: She is alert and oriented to person, place, and time.  Psychiatric:        Mood and Affect: Mood normal.         Future Appointments  Date Time Provider [place]  02/07/2019 11:20 AM Larey Dresser, MD [facility] None  02/27/2019 11:30 AM Lorretta Harp, MD [facility]  03/27/2019 11:10 AM Ladell Pier, MD [facility] None  04/03/2019  9:30 AM Elayne Snare, MD [facility] None    BP 113/65 (BP Location: Left Arm, Patient Position: Sitting, Cuff Size: Normal)   Pulse 68   Resp 16   Wt 283 lb 3.2 oz (128.5 kg)   LMP  07/28/2013 Comment: perimenopausal  SpO2 100%   BMI 43.70 kg/m   Weight yesterday- did not weigh Last visit weight- 275 lb  Ms Boddy was seen at home today and reported feeling well. She denied chest pain, SOB, headache, dizziness, orthopnea, fever or cough since our last visit. She stated she has been compliant with her medications though she has been taking more insulin than prescribed and does not have enough to get through the weekend. I spoke with Philis Fendt, PharmD, who stated he would send in a new prescription with new directions which should allow her to get the medications today. Additionally he stated he requested she schedule a follow up with him for next week. I spoke to the pharmacy who stated they would be able to have the medication ready this afternoon and Ms Woodroof would have her daughter pick it up for her. Her weight was elevated from our last visit but since it had been multiple weeks I did not advise on extra diuretics. Her medications were verified but she does not use a pillbox. I will follow up next week.    Jacquiline Doe, EMT 02/02/19  ACTION: Home visit completed Next visit planned for 1 week

## 2019-02-07 ENCOUNTER — Other Ambulatory Visit (HOSPITAL_COMMUNITY): Payer: Self-pay

## 2019-02-07 ENCOUNTER — Other Ambulatory Visit: Payer: Self-pay

## 2019-02-07 ENCOUNTER — Telehealth (HOSPITAL_COMMUNITY): Payer: Self-pay

## 2019-02-07 ENCOUNTER — Ambulatory Visit (HOSPITAL_COMMUNITY)
Admission: RE | Admit: 2019-02-07 | Discharge: 2019-02-07 | Disposition: A | Discharge status: Home / Self Care | Payer: Medicaid Other | Source: Ambulatory Visit | Attending: Cardiology | Admitting: Cardiology

## 2019-02-07 ENCOUNTER — Encounter (HOSPITAL_COMMUNITY): Payer: Self-pay | Admitting: Cardiology

## 2019-02-07 VITALS — BP 117/60 | HR 73 | Wt 284.8 lb

## 2019-02-07 DIAGNOSIS — I272 Pulmonary hypertension, unspecified: Secondary | ICD-10-CM | POA: Diagnosis not present

## 2019-02-07 DIAGNOSIS — I2510 Atherosclerotic heart disease of native coronary artery without angina pectoris: Secondary | ICD-10-CM | POA: Insufficient documentation

## 2019-02-07 DIAGNOSIS — Z9981 Dependence on supplemental oxygen: Secondary | ICD-10-CM | POA: Diagnosis not present

## 2019-02-07 DIAGNOSIS — I5042 Chronic combined systolic (congestive) and diastolic (congestive) heart failure: Secondary | ICD-10-CM

## 2019-02-07 DIAGNOSIS — E785 Hyperlipidemia, unspecified: Secondary | ICD-10-CM

## 2019-02-07 DIAGNOSIS — J449 Chronic obstructive pulmonary disease, unspecified: Secondary | ICD-10-CM | POA: Insufficient documentation

## 2019-02-07 DIAGNOSIS — Z8249 Family history of ischemic heart disease and other diseases of the circulatory system: Secondary | ICD-10-CM | POA: Diagnosis not present

## 2019-02-07 DIAGNOSIS — Z8674 Personal history of sudden cardiac arrest: Secondary | ICD-10-CM | POA: Insufficient documentation

## 2019-02-07 DIAGNOSIS — I130 Hypertensive heart and chronic kidney disease with heart failure and stage 1 through stage 4 chronic kidney disease, or unspecified chronic kidney disease: Secondary | ICD-10-CM | POA: Diagnosis not present

## 2019-02-07 DIAGNOSIS — Z9861 Coronary angioplasty status: Secondary | ICD-10-CM

## 2019-02-07 DIAGNOSIS — Z955 Presence of coronary angioplasty implant and graft: Secondary | ICD-10-CM | POA: Diagnosis not present

## 2019-02-07 DIAGNOSIS — G4733 Obstructive sleep apnea (adult) (pediatric): Secondary | ICD-10-CM | POA: Insufficient documentation

## 2019-02-07 DIAGNOSIS — Z794 Long term (current) use of insulin: Secondary | ICD-10-CM | POA: Diagnosis not present

## 2019-02-07 DIAGNOSIS — Z7982 Long term (current) use of aspirin: Secondary | ICD-10-CM | POA: Diagnosis not present

## 2019-02-07 DIAGNOSIS — Z86711 Personal history of pulmonary embolism: Secondary | ICD-10-CM | POA: Insufficient documentation

## 2019-02-07 DIAGNOSIS — Z8673 Personal history of transient ischemic attack (TIA), and cerebral infarction without residual deficits: Secondary | ICD-10-CM | POA: Diagnosis not present

## 2019-02-07 DIAGNOSIS — E1122 Type 2 diabetes mellitus with diabetic chronic kidney disease: Secondary | ICD-10-CM | POA: Diagnosis not present

## 2019-02-07 DIAGNOSIS — Z79899 Other long term (current) drug therapy: Secondary | ICD-10-CM | POA: Insufficient documentation

## 2019-02-07 DIAGNOSIS — I5022 Chronic systolic (congestive) heart failure: Secondary | ICD-10-CM | POA: Insufficient documentation

## 2019-02-07 DIAGNOSIS — I252 Old myocardial infarction: Secondary | ICD-10-CM | POA: Diagnosis not present

## 2019-02-07 DIAGNOSIS — Z87891 Personal history of nicotine dependence: Secondary | ICD-10-CM | POA: Diagnosis not present

## 2019-02-07 DIAGNOSIS — N183 Chronic kidney disease, stage 3 (moderate): Secondary | ICD-10-CM | POA: Insufficient documentation

## 2019-02-07 DIAGNOSIS — E1169 Type 2 diabetes mellitus with other specified complication: Secondary | ICD-10-CM | POA: Insufficient documentation

## 2019-02-07 DIAGNOSIS — J9611 Chronic respiratory failure with hypoxia: Secondary | ICD-10-CM

## 2019-02-07 LAB — BASIC METABOLIC PANEL
Anion gap: 9 (ref 5–15)
BUN: 28 mg/dL — ABNORMAL HIGH (ref 6–20)
CO2: 32 mmol/L (ref 22–32)
Calcium: 9.2 mg/dL (ref 8.9–10.3)
Chloride: 94 mmol/L — ABNORMAL LOW (ref 98–111)
Creatinine, Ser: 1.46 mg/dL — ABNORMAL HIGH (ref 0.44–1.00)
GFR calc Af Amer: 45 mL/min — ABNORMAL LOW (ref >60)
GFR calc non Af Amer: 39 mL/min — ABNORMAL LOW (ref >60)
Glucose, Bld: 325 mg/dL — ABNORMAL HIGH (ref 70–99)
Potassium: 3.7 mmol/L (ref 3.5–5.1)
Sodium: 135 mmol/L (ref 135–145)

## 2019-02-07 LAB — LIPID PANEL
Cholesterol: 137 mg/dL (ref 0–200)
HDL: 56 mg/dL (ref >40)
LDL Cholesterol: 52 mg/dL (ref 0–99)
Total CHOL/HDL Ratio: 2.4 RATIO
Triglycerides: 145 mg/dL (ref <150)
VLDL: 29 mg/dL (ref 0–40)

## 2019-02-07 LAB — CBC
HCT: 34.5 % — ABNORMAL LOW (ref 36.0–46.0)
Hemoglobin: 10.8 g/dL — ABNORMAL LOW (ref 12.0–15.0)
MCH: 27.6 pg (ref 26.0–34.0)
MCHC: 31.3 g/dL (ref 30.0–36.0)
MCV: 88.2 fL (ref 80.0–100.0)
Platelets: 209 10*3/uL (ref 150–400)
RBC: 3.91 MIL/uL (ref 3.87–5.11)
RDW: 14.2 % (ref 11.5–15.5)
WBC: 7.2 10*3/uL (ref 4.0–10.5)
nRBC: 0 % (ref 0.0–0.2)

## 2019-02-07 MED ORDER — BISOPROLOL FUMARATE 5 MG PO TABS: 5.0000 mg | ORAL_TABLET | Freq: Every day | ORAL | 6 refills | Status: DC

## 2019-02-07 NOTE — Addendum Note (Signed)
Encounter addended by: Larey Dresser, MD on: 02/07/2019 11:41 PM  Actions taken: Clinical Note Signed

## 2019-02-07 NOTE — Telephone Encounter (Signed)
I called Tamara Silva to remind her of her appointment tomorrow morning and schedule a visit for Friday. She was unhappy about the early time of her appointment tomorrow but said she would be there and agreed to meet me on Friday at 10:00.

## 2019-02-07 NOTE — Progress Notes (Addendum)
PCP: Ladell Pier, MD HF Cardiology: Dr Aundra Dubin   HPI: Ms. Tamara Silva is a 58 y.o. with a hx of chronic systolic CHF, HTN, DM-2, HLD, CVA 1/20 and CAD (PCI in 2006 and 2009 - not sure which vessel) and then in 2015 STEMI with Xience DES to distal RCA.  She has had frequent admits for anemia and CHF with transfusions and diuresis. Prasugrel was stopped. No source of bleeding found on EGD and colonoscopy.   She is on home 02 and nocturnal BiPAP for OHS/OSA and COPD.   She no longer smokes.  Hx of PE in 2014.    Hx of bradycardia with Coreg at higher dose.  Echo 05/31/18 with EF 40-45%, PA peak pressure 71 mmHg.    Admitted 10/25/18 with SOB and edema, + productive cough.  This started over a few weeks prior to admission. Hospital course complicated by PEA arrest on 10/28/18. CPR for 25 minutes. Extubated 10/30/18.  Developed AKI.  HF team followed closely to optimize HF.  She was massively volume overloaded and extensively diuresed, weight down 65 lbs total.  Echo showed EF down to 30-35%.  No coronary angiography due to AKI.   Readmitted  11/17/18 with lower extremity edema and volume overload. Diuresed with IV lasix and transitioned to torsemide 40 mg twice daily. Discharged on 11/21/18 to home on oxygen.   Today she returns for followup of CHF.  Weight is up about 10 lbs.  She attributes this to inactivity in setting of coronavirus epidemic.  No lightheadedness or dizziness.  She is using home oxygen and uses Bipap sometimes at night (it is uncomfortable for her).  No dyspnea walking in the house.  She gets short of breath walking < 1 block outdoors.  No orthopnea/PND.  She has occasional transient, atypical (nonexertional) chest pain.   ECG (personally reviewed): NSR, old inferior MI, old anterior MI.   Labs (7/20): K 3.7, creatinine 1.56  PMH: 1. CVA (1/20). 2. Type 2 diabetes 3. HTN 4. H/o seizures 5. PEA arrest (5/20) with CPR.  6. COPD: Home oxygen.  Stopped smoking in 1/15.  7. CAD: PCI  [facility] 2006, [facility] 2009.  - Inferior STEMI 5/15, DES to RCA.  - Poor responder to Plavix.  8. Hyperlipidemia 9. PE: 2014, LLL.  10. OHS/OSA: Home oxygen and Bipap used.  11. Chronic systolic CHF: Suspect primarily ischemic cardiomyopathy.  - Echo (5/15): EF 35-40% - Echo (2/16): EF 40-45% - Echo (1/20): EF 40-45% - Echo (5/20): EF 30-35%, mild RV dilation.  - RHC (5/20): mean RA 9, PA 62/22 mean 37, mean PCWP 15, CI 4.29, PVR 2.1 WU.  12. CKD: Stage 3.  13. Fe deficiency anemia: EGD and colonoscopy without definite source of bleeding.   ROS: All systems negative except as listed in HPI, PMH and Problem List.  SH:  Social History   Socioeconomic History  . Marital status: Single    Spouse name: Not on file  . Number of children: 1  . Years of education: Not on file  . Highest education level: Not on file  Occupational History  . Not on file  Social Needs  . Financial resource strain: Somewhat hard  . Food insecurity    Worry: Sometimes true    Inability: Sometimes true  . Transportation needs    Medical: Yes    Non-medical: Yes  Tobacco Use  . Smoking status: Former Smoker    Packs/day: 0.50    Years: 20.00  Pack years: 10.00    Types: Cigarettes    Quit date: 04/30/2014    Years since quitting: 4.7  . Smokeless tobacco: Never Used  . Tobacco comment: smoked off and on x 20 years  Substance and Sexual Activity  . Alcohol use: No    Alcohol/week: 0.0 standard drinks  . Drug use: Not Currently    Types: Cocaine  . Sexual activity: Not Currently  Lifestyle  . Physical activity    Days per week: 0 days    Minutes per session: 0 min  . Stress: Rather much  Relationships  . Social Herbalist on phone: Twice a week    Gets together: Once a week    Attends religious service: 1 to 4 times per year    Active member of club or organization: No    Attends meetings of clubs or organizations: Never    Relationship status: Living with partner  .  Intimate partner violence    Fear of current or ex partner: Patient refused    Emotionally abused: Patient refused    Physically abused: Patient refused    Forced sexual activity: Patient refused  Other Topics Concern  . Not on file  Social History Narrative   Origibnally from [place]   Most recently from [place]   Daughter lives in town      On Social security to CHF, COPD, CAD    FH:  Family History  Problem Relation Age of Onset  . Stroke Mother   . Hypertension Mother   . Colon cancer Father   . Diabetes Father   . Heart attack Father   . Sarcoidosis Other   . Breast cancer Paternal Aunt   . Emphysema Brother   . Lung disease Brother        Unknown type, 3 brothers, one with liver and lung disease  . Asthma Paternal Aunt     Current Outpatient Medications  Medication Sig Dispense Refill  . ACCU-CHEK AVIVA PLUS test strip USE 1 STRIP TO CHECK GLUCOSE 3 TIMES DAILY (Patient taking differently: 1 each by Other route 3 (three) times daily. ) 100 each 2  . albuterol (PROAIR HFA) 108 (90 Base) MCG/ACT inhaler INHALE 2 PUFFS BY MOUTH 4 TIMES DAILY AS NEEDED 18 g 11  . aspirin EC 81 MG tablet Take 1 tablet (81 mg total) by mouth daily. 90 tablet 3  . ferrous gluconate (FERGON) 324 MG tablet Take 1 tablet (324 mg total) by mouth daily with breakfast. Office visit needed prior to future refills 30 tablet 0  . hydrocerin (EUCERIN) CREA Apply 1 application topically 2 (two) times daily.  0  . insulin aspart (NOVOLOG FLEXPEN) 100 UNIT/ML FlexPen Inject 10 units three times daily before each meal 15 mL 0  . insulin detemir (LEVEMIR) 100 UNIT/ML injection Inject 20 units [place] BID. 10 mL 2  . ipratropium-albuterol (DUONEB) 0.5-2.5 (3) MG/3ML SOLN Use nebulizer to inhale contents of one vial every 6 hours as needed for shortness of breath, wheezing 360 mL 11  . isosorbide-hydrALAZINE (BIDIL) 20-37.5 MG tablet Take 0.5 tablets by mouth 3 (three) times daily. 45 tablet 3  . liraglutide  (VICTOZA) 18 MG/3ML SOPN 0.6 ml subcut x 1 wk then increase to 1.2 ml daily 3 mL 3  . NEEDLE, DISP, 26 G 26G X 1/2" MISC 1 application by Does not apply route daily. 100 each 0  . omeprazole (PRILOSEC) 40 MG capsule Take 1 capsule (40 mg total)  by mouth daily. 90 capsule 3  . PAZEO 0.7 % SOLN PLACE 1 DROP IN BOTH EYES IN THE MORNING    . potassium chloride SA (K-DUR) 20 MEQ tablet Take 1 tablet (20 mEq total) by mouth daily. 30 tablet 3  . RESTASIS 0.05 % ophthalmic emulsion PLACE ONE DROP INTO BOTH EYES TWICE A DAY    . rosuvastatin (CRESTOR) 40 MG tablet Take 1 tablet (40 mg total) by mouth daily at 6 PM. 30 tablet 4  . spironolactone (ALDACTONE) 25 MG tablet Take 1 tablet (25 mg total) by mouth daily. 90 tablet 1  . torsemide (DEMADEX) 20 MG tablet Take 2 tablets (40 mg total) by mouth every evening AND 3 tablets (60 mg total) every morning. 150 tablet 3  . umeclidinium-vilanterol (ANORO ELLIPTA) 62.5-25 MCG/INH AEPB Inhale 1 puff into the lungs daily. 1 each 11  . vitamin B-12 (CYANOCOBALAMIN) 1000 MCG tablet Take 1,000 mcg by mouth daily.    . bisoprolol (ZEBETA) 5 MG tablet Take 1 tablet (5 mg total) by mouth daily. 30 tablet 6   No current facility-administered medications for this encounter.     Vitals:   02/07/19 1130  BP: 117/60  Pulse: 73  SpO2: 98%  Weight: 129.2 kg (284 lb 12.8 oz)   Wt Readings from Last 3 Encounters:  02/07/19 129.2 kg (284 lb 12.8 oz)  02/02/19 128.5 kg (283 lb 3.2 oz)  01/19/19 124.7 kg (275 lb)    PHYSICAL EXAM: General: NAD Neck: No JVD, no thyromegaly or thyroid nodule.  Lungs: Distant breath sounds.  CV: Nondisplaced PMI.  Heart regular S1/S2, no S3/S4, no murmur.  Trace ankle edema.  No carotid bruit.  Difficult to palpate pedal pulses.  Abdomen: Soft, nontender, no hepatosplenomegaly, no distention.  Skin: Intact without lesions or rashes.  Neurologic: Alert and oriented x 3.  Psych: Normal affect. Extremities: No clubbing or cyanosis.   HEENT: Normal.   ASSESSMENT & PLAN: 1. H/O PEA arrest: Due to severe hypoxemia in 5/20 in setting of CHF exacerbation.   2. Chronic systolic CHF: Suspect ischemic cardiomyopathy. Echo in 1/20 with EF 40-45%, RV moderately dilated, PASP 71 mmHg. Echo in 5/20 with lower EF, 30-35%, and mildly dilated RV. Suspect significant RV failure, may be related to OHS/OSA and COPD. She was massively volume overloaded during 5/20 admission and diuresed extensively.  RHC done 11/06/18 showed high cardiac output and minimally elevated filling pressures after diuresis.  PVR was not elevated, appeared to be high output PH.  On exam today, she is not significantly volume overloaded.  NYHA class III symptoms.  - Continue torsemide 60 qam/40 qpm.  BMET today.  - Continue Bidil 1/2 tab tid  - Continue spironolactone 25 mg daily.  - No ACEI/ARB/ARNI with recent AKI.  - Given significant lung disease/COPD, I will have her stop Coreg and start bisoprolol 5 mg daily (more beta-1 selective).  - Repeat echo in 1 month, will need to consider ICD if no intervention on cath (see below).  She would not be a CRT candidate.   - She would likely be a good Cardiomems candidate, will need to discuss this at next appointment.  3 CKD: Stage III. Last creatinine down to 1.5.  - BMET today.  4. COPD: On home oxygen at baseline. Has quit smoking.  5. OHS/OSA: On home oxygen at all times and Bipap at night.  - Having trouble with Bipap, encouraged her to followup with sleep medicine.  6. CAD: History of PCI,  most recently had inferior STEMI in 5/15 with DES to RCA. She has atypical chest pain.  At last admission, EF was noted to be lower at 30-35%.  Coronary angiography was not undertaken given AKI, but there was concern for progressive CAD playing a role in the fall in her EF.  - Continue ASA 81 and Crestor 40 daily.  Check lipids today.  - Creatinine is improved, I will arrange for LHC/RHC.  We discussed risks/benefits of procedure  and she agrees.  I will get a BMET today.  She will hold torsemide on the day of the procedure.  7. Remote PE: She has not been anticoagulated.  8. Anemia: History of GI bleeding, had endoscopies earlier this year with no bleeding source found. Also with history of B12 deficiency.  - CBC today.  9. Pulmonary hypertension: Looked like high output PH by RHC. Not on selective pulmonary vasodilators.   Followup in 1 month with me.   Loralie Champagne 02/07/2019

## 2019-02-07 NOTE — H&P (View-Only) (Signed)
PCP: Ladell Pier, MD HF Cardiology: Dr Aundra Dubin   HPI: Ms. Abbett is a 58 y.o. with a hx of chronic systolic CHF, HTN, DM-2, HLD, CVA 1/20 and CAD (PCI in 2006 and 2009 - not sure which vessel) and then in 2015 STEMI with Xience DES to distal RCA.  She has had frequent admits for anemia and CHF with transfusions and diuresis. Prasugrel was stopped. No source of bleeding found on EGD and colonoscopy.   She is on home 02 and nocturnal BiPAP for OHS/OSA and COPD.   She no longer smokes.  Hx of PE in 2014.    Hx of bradycardia with Coreg at higher dose.  Echo 05/31/18 with EF 40-45%, PA peak pressure 71 mmHg.    Admitted 10/25/18 with SOB and edema, + productive cough.  This started over a few weeks prior to admission. Hospital course complicated by PEA arrest on 10/28/18. CPR for 25 minutes. Extubated 10/30/18.  Developed AKI.  HF team followed closely to optimize HF.  She was massively volume overloaded and extensively diuresed, weight down 65 lbs total.  Echo showed EF down to 30-35%.  No coronary angiography due to AKI.   Readmitted  11/17/18 with lower extremity edema and volume overload. Diuresed with IV lasix and transitioned to torsemide 40 mg twice daily. Discharged on 11/21/18 to home on oxygen.   Today she returns for followup of CHF.  Weight is up about 10 lbs.  She attributes this to inactivity in setting of coronavirus epidemic.  No lightheadedness or dizziness.  She is using home oxygen and uses Bipap sometimes at night (it is uncomfortable for her).  No dyspnea walking in the house.  She gets short of breath walking < 1 block outdoors.  No orthopnea/PND.  She has occasional transient, atypical (nonexertional) chest pain.   ECG (personally reviewed): NSR, old inferior MI, old anterior MI.   Labs (7/20): K 3.7, creatinine 1.56  PMH: 1. CVA (1/20). 2. Type 2 diabetes 3. HTN 4. H/o seizures 5. PEA arrest (5/20) with CPR.  6. COPD: Home oxygen.  Stopped smoking in 1/15.  7. CAD: PCI  [facility] 2006, [facility] 2009.  - Inferior STEMI 5/15, DES to RCA.  - Poor responder to Plavix.  8. Hyperlipidemia 9. PE: 2014, LLL.  10. OHS/OSA: Home oxygen and Bipap used.  11. Chronic systolic CHF: Suspect primarily ischemic cardiomyopathy.  - Echo (5/15): EF 35-40% - Echo (2/16): EF 40-45% - Echo (1/20): EF 40-45% - Echo (5/20): EF 30-35%, mild RV dilation.  - RHC (5/20): mean RA 9, PA 62/22 mean 37, mean PCWP 15, CI 4.29, PVR 2.1 WU.  12. CKD: Stage 3.  13. Fe deficiency anemia: EGD and colonoscopy without definite source of bleeding.   ROS: All systems negative except as listed in HPI, PMH and Problem List.  SH:  Social History   Socioeconomic History  . Marital status: Single    Spouse name: Not on file  . Number of children: 1  . Years of education: Not on file  . Highest education level: Not on file  Occupational History  . Not on file  Social Needs  . Financial resource strain: Somewhat hard  . Food insecurity    Worry: Sometimes true    Inability: Sometimes true  . Transportation needs    Medical: Yes    Non-medical: Yes  Tobacco Use  . Smoking status: Former Smoker    Packs/day: 0.50    Years: 20.00  Pack years: 10.00    Types: Cigarettes    Quit date: 04/30/2014    Years since quitting: 4.7  . Smokeless tobacco: Never Used  . Tobacco comment: smoked off and on x 20 years  Substance and Sexual Activity  . Alcohol use: No    Alcohol/week: 0.0 standard drinks  . Drug use: Not Currently    Types: Cocaine  . Sexual activity: Not Currently  Lifestyle  . Physical activity    Days per week: 0 days    Minutes per session: 0 min  . Stress: Rather much  Relationships  . Social Herbalist on phone: Twice a week    Gets together: Once a week    Attends religious service: 1 to 4 times per year    Active member of club or organization: No    Attends meetings of clubs or organizations: Never    Relationship status: Living with partner  .  Intimate partner violence    Fear of current or ex partner: Patient refused    Emotionally abused: Patient refused    Physically abused: Patient refused    Forced sexual activity: Patient refused  Other Topics Concern  . Not on file  Social History Narrative   Origibnally from [place]   Most recently from [place]   Daughter lives in town      On Social security to CHF, COPD, CAD    FH:  Family History  Problem Relation Age of Onset  . Stroke Mother   . Hypertension Mother   . Colon cancer Father   . Diabetes Father   . Heart attack Father   . Sarcoidosis Other   . Breast cancer Paternal Aunt   . Emphysema Brother   . Lung disease Brother        Unknown type, 3 brothers, one with liver and lung disease  . Asthma Paternal Aunt     Current Outpatient Medications  Medication Sig Dispense Refill  . ACCU-CHEK AVIVA PLUS test strip USE 1 STRIP TO CHECK GLUCOSE 3 TIMES DAILY (Patient taking differently: 1 each by Other route 3 (three) times daily. ) 100 each 2  . albuterol (PROAIR HFA) 108 (90 Base) MCG/ACT inhaler INHALE 2 PUFFS BY MOUTH 4 TIMES DAILY AS NEEDED 18 g 11  . aspirin EC 81 MG tablet Take 1 tablet (81 mg total) by mouth daily. 90 tablet 3  . ferrous gluconate (FERGON) 324 MG tablet Take 1 tablet (324 mg total) by mouth daily with breakfast. Office visit needed prior to future refills 30 tablet 0  . hydrocerin (EUCERIN) CREA Apply 1 application topically 2 (two) times daily.  0  . insulin aspart (NOVOLOG FLEXPEN) 100 UNIT/ML FlexPen Inject 10 units three times daily before each meal 15 mL 0  . insulin detemir (LEVEMIR) 100 UNIT/ML injection Inject 20 units Sayre BID. 10 mL 2  . ipratropium-albuterol (DUONEB) 0.5-2.5 (3) MG/3ML SOLN Use nebulizer to inhale contents of one vial every 6 hours as needed for shortness of breath, wheezing 360 mL 11  . isosorbide-hydrALAZINE (BIDIL) 20-37.5 MG tablet Take 0.5 tablets by mouth 3 (three) times daily. 45 tablet 3  . liraglutide  (VICTOZA) 18 MG/3ML SOPN 0.6 ml subcut x 1 wk then increase to 1.2 ml daily 3 mL 3  . NEEDLE, DISP, 26 G 26G X 1/2" MISC 1 application by Does not apply route daily. 100 each 0  . omeprazole (PRILOSEC) 40 MG capsule Take 1 capsule (40 mg total)  by mouth daily. 90 capsule 3  . PAZEO 0.7 % SOLN PLACE 1 DROP IN BOTH EYES IN THE MORNING    . potassium chloride SA (K-DUR) 20 MEQ tablet Take 1 tablet (20 mEq total) by mouth daily. 30 tablet 3  . RESTASIS 0.05 % ophthalmic emulsion PLACE ONE DROP INTO BOTH EYES TWICE A DAY    . rosuvastatin (CRESTOR) 40 MG tablet Take 1 tablet (40 mg total) by mouth daily at 6 PM. 30 tablet 4  . spironolactone (ALDACTONE) 25 MG tablet Take 1 tablet (25 mg total) by mouth daily. 90 tablet 1  . torsemide (DEMADEX) 20 MG tablet Take 2 tablets (40 mg total) by mouth every evening AND 3 tablets (60 mg total) every morning. 150 tablet 3  . umeclidinium-vilanterol (ANORO ELLIPTA) 62.5-25 MCG/INH AEPB Inhale 1 puff into the lungs daily. 1 each 11  . vitamin B-12 (CYANOCOBALAMIN) 1000 MCG tablet Take 1,000 mcg by mouth daily.    . bisoprolol (ZEBETA) 5 MG tablet Take 1 tablet (5 mg total) by mouth daily. 30 tablet 6   No current facility-administered medications for this encounter.     Vitals:   02/07/19 1130  BP: 117/60  Pulse: 73  SpO2: 98%  Weight: 129.2 kg (284 lb 12.8 oz)   Wt Readings from Last 3 Encounters:  02/07/19 129.2 kg (284 lb 12.8 oz)  02/02/19 128.5 kg (283 lb 3.2 oz)  01/19/19 124.7 kg (275 lb)    PHYSICAL EXAM: General: NAD Neck: No JVD, no thyromegaly or thyroid nodule.  Lungs: Distant breath sounds.  CV: Nondisplaced PMI.  Heart regular S1/S2, no S3/S4, no murmur.  Trace ankle edema.  No carotid bruit.  Difficult to palpate pedal pulses.  Abdomen: Soft, nontender, no hepatosplenomegaly, no distention.  Skin: Intact without lesions or rashes.  Neurologic: Alert and oriented x 3.  Psych: Normal affect. Extremities: No clubbing or cyanosis.   HEENT: Normal.   ASSESSMENT & PLAN: 1. H/O PEA arrest: Due to severe hypoxemia in 5/20 in setting of CHF exacerbation.   2. Chronic systolic CHF: Suspect ischemic cardiomyopathy. Echo in 1/20 with EF 40-45%, RV moderately dilated, PASP 71 mmHg. Echo in 5/20 with lower EF, 30-35%, and mildly dilated RV. Suspect significant RV failure, may be related to OHS/OSA and COPD. She was massively volume overloaded during 5/20 admission and diuresed extensively.  RHC done 11/06/18 showed high cardiac output and minimally elevated filling pressures after diuresis.  PVR was not elevated, appeared to be high output PH.  On exam today, she is not significantly volume overloaded.  NYHA class III symptoms.  - Continue torsemide 60 qam/40 qpm.  BMET today.  - Continue Bidil 1/2 tab tid  - Continue spironolactone 25 mg daily.  - No ACEI/ARB/ARNI with recent AKI.  - Given significant lung disease/COPD, I will have her stop Coreg and start bisoprolol 5 mg daily (more beta-1 selective).  - Repeat echo in 1 month, will need to consider ICD if no intervention on cath (see below).  She would not be a CRT candidate.   - She would likely be a good Cardiomems candidate, will need to discuss this at next appointment.  3 CKD: Stage III. Last creatinine down to 1.5.  - BMET today.  4. COPD: On home oxygen at baseline. Has quit smoking.  5. OHS/OSA: On home oxygen at all times and Bipap at night.  - Having trouble with Bipap, encouraged her to followup with sleep medicine.  6. CAD: History of PCI,  most recently had inferior STEMI in 5/15 with DES to RCA. She has atypical chest pain.  At last admission, EF was noted to be lower at 30-35%.  Coronary angiography was not undertaken given AKI, but there was concern for progressive CAD playing a role in the fall in her EF.  - Continue ASA 81 and Crestor 40 daily.  Check lipids today.  - Creatinine is improved, I will arrange for LHC/RHC.  We discussed risks/benefits of procedure  and she agrees.  I will get a BMET today.  She will hold torsemide on the day of the procedure.  7. Remote PE: She has not been anticoagulated.  8. Anemia: History of GI bleeding, had endoscopies earlier this year with no bleeding source found. Also with history of B12 deficiency.  - CBC today.  9. Pulmonary hypertension: Looked like high output PH by RHC. Not on selective pulmonary vasodilators.   Followup in 1 month with me.   Loralie Champagne 02/07/2019

## 2019-02-07 NOTE — Progress Notes (Signed)
Patient attempted 6MW, she was unable to complete the full 6 mins due to back pain Patient ambulated for approximately  2:14 and walked 160 ft (48.35m) HR ranged 90-98 o2 sats ranged 97-95% on 4L

## 2019-02-07 NOTE — Patient Instructions (Addendum)
STOP Coreg  START Bisoprolol 5mg  (1 tab) daily  Labs today We will only contact you if something comes back abnormal or we need to make some changes. Otherwise no news is good news!   Your physician recommends that you schedule a follow-up appointment in: 1 month with an ECHO  Your physician has requested that you have an echocardiogram. Echocardiography is a painless test that uses sound waves to create images of your heart. It provides your doctor with information about the size and shape of your heart and how well your heart's chambers and valves are working. This procedure takes approximately one hour. There are no restrictions for this procedure.   At the [facility], you and your health needs are our priority. As part of our continuing mission to provide you with exceptional heart care, we have created designated Provider Care Teams. These Care Teams include your primary Cardiologist (physician) and Advanced Practice Providers (APPs- Physician Assistants and Nurse Practitioners) who all work together to provide you with the care you need, when you need it.   You may see any of the following providers on your designated Care Team at your next follow up: [name] Kitchen Dr Glori Bickers . Dr Loralie Champagne . Darrick Grinder, NP   Please be sure to bring in all your medications bottles to every appointment.      [facility] [place] [id number] [place] [address] Dept: [phone] Loc: [phone]  Sunjai Levandoski  02/07/2019  You are scheduled for a Cardiac Catheterization on Monday, September 14 with Dr. Loralie Champagne.  1. Please arrive at the [facility] (Main Entrance A) at [facility]: [address] [place], [place] [address] at 10:00 AM (This time is two hours before your procedure to ensure your preparation). Free valet parking service is available.   Special note: Every  effort is made to have your procedure done on time. Please understand that emergencies sometimes delay scheduled procedures.  2. Diet: Do not eat solid foods or drink liquids after midnight.  CAN TAKE YOUR MEDICINE EARLY IN THE MORNING WITH SMALL SIP OF WATER  3. Labs were done today in office.  YOU HAVE A COVID SCREEN SCHEDULED FOR FRIDAY September 11TH, 2020 AT 1:50PM AT [facility]. YOU HAVE TO REMAIN QUARANTINED FROM THE TIME OF YOUR TEST UNTIL YOUR PROCEDURE ON Monday 02/12/2019.   4. Medication instructions in preparation for your procedure:   Contrast Allergy: No    HOLD TORSEMIDE AND SPIRONOLACTONE ON THE DAY OF THE PROCEDURE  TAKE HALF DOSE OF EVENING DOSE OF LEVEMIR ON THE DAY BEFORE PROCEDURE 02/11/19.   HOLD ALL INSULINS ON THE DAY OF THE PROCEDURE   On the morning of your procedure, take your Aspirin and any morning medicines NOT listed above.  You may use sips of water.  5. Plan for one night stay--bring personal belongings. 6. Bring a current list of your medications and current insurance cards. 7. You MUST have a responsible person to drive you home. 8. Someone MUST be with you the first 24 hours after you arrive home or your discharge will be delayed. 9. Please wear clothes that are easy to get on and off and wear slip-on shoes.  Thank you for allowing [place] to care for you!   -- [facility]

## 2019-02-08 ENCOUNTER — Ambulatory Visit: Payer: Medicaid Other | Attending: Internal Medicine | Admitting: Pharmacist

## 2019-02-08 ENCOUNTER — Telehealth: Payer: Self-pay

## 2019-02-08 DIAGNOSIS — E162 Hypoglycemia, unspecified: Secondary | ICD-10-CM | POA: Diagnosis not present

## 2019-02-08 DIAGNOSIS — Z7189 Other specified counseling: Secondary | ICD-10-CM

## 2019-02-08 NOTE — Progress Notes (Signed)
   PCP: Dr. Wynetta Emery  Patient was educated on the use of Victoza. Reviewed necessary supplies and operation of the pen. Also reviewed goal blood glucose levels. Patient was able to demonstrate use. All questions and concerns were addressed.  Of note, PA was approved. Confirmed with pt's pharmacy that her Victoza will be delivered today.
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
## 2019-02-08 NOTE — Patient Instructions (Signed)
Thank you for coming to see me today. Please do the following:  1. Start Victoza. Take 0.6 mg daily for one week.  2. Then, increase to 1.2 mg daily for one week. 3. Then, increase to 1.8 mg daily for one week.  4. Continue checking blood sugars at home. It's really important that you record these and bring these in to your next doctor's appointment. If you get in readings above 500 or lower than 70, call me or the clinic to let your doctor know. See below on how to treat low blood sugar.  5. Continue making the lifestyle changes we've discussed together during our visit. Diet and exercise play a significant role in improving your blood sugars.  6. Follow-up with Dr. Wynetta Emery.   Hypoglycemia or low blood sugar:   Low blood sugar can happen quickly and may become an emergency if not treated right away.   While this shouldn't happen often, it can be brought upon if you skip a meal or do not eat enough. Also, if your insulin or other diabetes medications are dosed too high, this can cause your blood sugar to go to low.   Warning signs of low blood sugar include: 1. Feeling shaky or dizzy 2. Feeling weak or tired  3. Excessive hunger 4. Feeling anxious or upset  5. Sweating even when you aren't exercising  What to do if I experience low blood sugar? 1. Check your blood sugar with your meter. If lower than 70, proceed to step 2.  2. Treat with 3-4 glucose tablets or 3 packets of regular sugar. If these aren't around, you can try hard candy. Yet another option would be to drink 4 ounces of fruit juice or 6 ounces of REGULAR soda.  3. Re-check your sugar in 15 minutes. If it is still below 70, do what you did in step 2 again. If has come back up, go ahead and eat a snack or small meal at this time.

## 2019-02-09 ENCOUNTER — Other Ambulatory Visit (HOSPITAL_COMMUNITY)
Admission: RE | Admit: 2019-02-09 | Discharge: 2019-02-09 | Disposition: A | Discharge status: Home / Self Care | Payer: Medicaid Other | Source: Ambulatory Visit | Attending: Cardiology | Admitting: Cardiology

## 2019-02-09 ENCOUNTER — Other Ambulatory Visit: Payer: Self-pay | Admitting: Pharmacist

## 2019-02-09 DIAGNOSIS — Z91199 Patient's noncompliance with other medical treatment and regimen due to unspecified reason: Secondary | ICD-10-CM

## 2019-02-09 DIAGNOSIS — E1122 Type 2 diabetes mellitus with diabetic chronic kidney disease: Secondary | ICD-10-CM

## 2019-02-09 DIAGNOSIS — Z794 Long term (current) use of insulin: Secondary | ICD-10-CM

## 2019-02-09 DIAGNOSIS — Z01812 Encounter for preprocedural laboratory examination: Secondary | ICD-10-CM | POA: Diagnosis not present

## 2019-02-09 DIAGNOSIS — Z20828 Contact with and (suspected) exposure to other viral communicable diseases: Secondary | ICD-10-CM | POA: Diagnosis not present

## 2019-02-09 DIAGNOSIS — Z9119 Patient's noncompliance with other medical treatment and regimen: Secondary | ICD-10-CM

## 2019-02-09 LAB — SARS CORONAVIRUS 2 (TAT 6-24 HRS): SARS Coronavirus 2: NEGATIVE

## 2019-02-09 MED ORDER — NOVOLOG FLEXPEN 100 UNIT/ML ~~LOC~~ SOPN: PEN_INJECTOR | SUBCUTANEOUS | 0 refills | Status: DC

## 2019-02-09 NOTE — Telephone Encounter (Signed)
Completed SCAT application faxed to SCAT eligibility.  

## 2019-02-12 ENCOUNTER — Encounter (HOSPITAL_COMMUNITY)
Admission: RE | Disposition: A | Discharge status: Home / Self Care | Payer: Self-pay | Source: Home / Self Care | Attending: Cardiology

## 2019-02-12 ENCOUNTER — Ambulatory Visit (HOSPITAL_COMMUNITY)
Admission: RE | Admit: 2019-02-12 | Discharge: 2019-02-12 | Disposition: A | Discharge status: Home / Self Care | Payer: Medicaid Other | Attending: Cardiology | Admitting: Cardiology

## 2019-02-12 ENCOUNTER — Other Ambulatory Visit: Payer: Self-pay

## 2019-02-12 ENCOUNTER — Telehealth (HOSPITAL_COMMUNITY): Payer: Self-pay

## 2019-02-12 DIAGNOSIS — Z87891 Personal history of nicotine dependence: Secondary | ICD-10-CM | POA: Diagnosis not present

## 2019-02-12 DIAGNOSIS — Z8674 Personal history of sudden cardiac arrest: Secondary | ICD-10-CM | POA: Diagnosis not present

## 2019-02-12 DIAGNOSIS — N183 Chronic kidney disease, stage 3 (moderate): Secondary | ICD-10-CM | POA: Diagnosis not present

## 2019-02-12 DIAGNOSIS — Z7982 Long term (current) use of aspirin: Secondary | ICD-10-CM | POA: Insufficient documentation

## 2019-02-12 DIAGNOSIS — I272 Pulmonary hypertension, unspecified: Secondary | ICD-10-CM | POA: Diagnosis not present

## 2019-02-12 DIAGNOSIS — J449 Chronic obstructive pulmonary disease, unspecified: Secondary | ICD-10-CM | POA: Diagnosis not present

## 2019-02-12 DIAGNOSIS — I252 Old myocardial infarction: Secondary | ICD-10-CM | POA: Insufficient documentation

## 2019-02-12 DIAGNOSIS — I130 Hypertensive heart and chronic kidney disease with heart failure and stage 1 through stage 4 chronic kidney disease, or unspecified chronic kidney disease: Secondary | ICD-10-CM | POA: Insufficient documentation

## 2019-02-12 DIAGNOSIS — I5042 Chronic combined systolic (congestive) and diastolic (congestive) heart failure: Secondary | ICD-10-CM

## 2019-02-12 DIAGNOSIS — G4733 Obstructive sleep apnea (adult) (pediatric): Secondary | ICD-10-CM | POA: Diagnosis not present

## 2019-02-12 DIAGNOSIS — E785 Hyperlipidemia, unspecified: Secondary | ICD-10-CM | POA: Diagnosis not present

## 2019-02-12 DIAGNOSIS — I5022 Chronic systolic (congestive) heart failure: Secondary | ICD-10-CM | POA: Diagnosis not present

## 2019-02-12 DIAGNOSIS — Z794 Long term (current) use of insulin: Secondary | ICD-10-CM | POA: Diagnosis not present

## 2019-02-12 DIAGNOSIS — Z9981 Dependence on supplemental oxygen: Secondary | ICD-10-CM | POA: Diagnosis not present

## 2019-02-12 DIAGNOSIS — E1122 Type 2 diabetes mellitus with diabetic chronic kidney disease: Secondary | ICD-10-CM | POA: Diagnosis not present

## 2019-02-12 DIAGNOSIS — Z8673 Personal history of transient ischemic attack (TIA), and cerebral infarction without residual deficits: Secondary | ICD-10-CM | POA: Diagnosis not present

## 2019-02-12 DIAGNOSIS — Z955 Presence of coronary angioplasty implant and graft: Secondary | ICD-10-CM | POA: Diagnosis not present

## 2019-02-12 DIAGNOSIS — I509 Heart failure, unspecified: Secondary | ICD-10-CM

## 2019-02-12 DIAGNOSIS — Z79899 Other long term (current) drug therapy: Secondary | ICD-10-CM | POA: Insufficient documentation

## 2019-02-12 DIAGNOSIS — I2510 Atherosclerotic heart disease of native coronary artery without angina pectoris: Secondary | ICD-10-CM

## 2019-02-12 DIAGNOSIS — Z8249 Family history of ischemic heart disease and other diseases of the circulatory system: Secondary | ICD-10-CM | POA: Diagnosis not present

## 2019-02-12 DIAGNOSIS — Z86711 Personal history of pulmonary embolism: Secondary | ICD-10-CM | POA: Insufficient documentation

## 2019-02-12 HISTORY — PX: RIGHT/LEFT HEART CATH AND CORONARY ANGIOGRAPHY: CATH118266

## 2019-02-12 LAB — POCT I-STAT EG7
Acid-Base Excess: 7 mmol/L — ABNORMAL HIGH (ref 0.0–2.0)
Acid-Base Excess: 8 mmol/L — ABNORMAL HIGH (ref 0.0–2.0)
Bicarbonate: 36 mmol/L — ABNORMAL HIGH (ref 20.0–28.0)
Bicarbonate: 36.5 mmol/L — ABNORMAL HIGH (ref 20.0–28.0)
Calcium, Ion: 1.29 mmol/L (ref 1.15–1.40)
Calcium, Ion: 1.33 mmol/L (ref 1.15–1.40)
HCT: 32 % — ABNORMAL LOW (ref 36.0–46.0)
HCT: 33 % — ABNORMAL LOW (ref 36.0–46.0)
Hemoglobin: 10.9 g/dL — ABNORMAL LOW (ref 12.0–15.0)
Hemoglobin: 11.2 g/dL — ABNORMAL LOW (ref 12.0–15.0)
O2 Saturation: 68 %
O2 Saturation: 74 %
Potassium: 3.8 mmol/L (ref 3.5–5.1)
Potassium: 3.9 mmol/L (ref 3.5–5.1)
Sodium: 142 mmol/L (ref 135–145)
Sodium: 142 mmol/L (ref 135–145)
TCO2: 38 mmol/L — ABNORMAL HIGH (ref 22–32)
TCO2: 39 mmol/L — ABNORMAL HIGH (ref 22–32)
pCO2, Ven: 73.6 mmHg (ref 44.0–60.0)
pCO2, Ven: 74.7 mmHg (ref 44.0–60.0)
pH, Ven: 7.297 (ref 7.250–7.430)
pH, Ven: 7.297 (ref 7.250–7.430)
pO2, Ven: 41 mmHg (ref 32.0–45.0)
pO2, Ven: 45 mmHg (ref 32.0–45.0)

## 2019-02-12 LAB — GLUCOSE, CAPILLARY
Glucose-Capillary: 283 mg/dL — ABNORMAL HIGH (ref 70–99)
Glucose-Capillary: 248 mg/dL — ABNORMAL HIGH (ref 70–99)

## 2019-02-12 SURGERY — RIGHT/LEFT HEART CATH AND CORONARY ANGIOGRAPHY
Anesthesia: LOCAL

## 2019-02-12 MED ORDER — VERAPAMIL HCL 2.5 MG/ML IV SOLN
INTRAVENOUS | Status: DC | PRN
  Administered 2019-02-12: 10 mL via INTRA_ARTERIAL

## 2019-02-12 MED ORDER — ACETAMINOPHEN 325 MG PO TABS: 650.0000 mg | ORAL_TABLET | ORAL | Status: DC | PRN

## 2019-02-12 MED ORDER — LIDOCAINE HCL (PF) 1 % IJ SOLN
INTRAMUSCULAR | Status: DC | PRN
  Administered 2019-02-12 (×2): 2 mL

## 2019-02-12 MED ORDER — SODIUM CHLORIDE 0.9% FLUSH: 3.0000 mL | INTRAVENOUS | Status: DC | PRN

## 2019-02-12 MED ORDER — HYDRALAZINE HCL 20 MG/ML IJ SOLN: 10.0000 mg | INTRAMUSCULAR | Status: DC | PRN

## 2019-02-12 MED ORDER — SODIUM CHLORIDE 0.9% FLUSH: 3.0000 mL | Freq: Two times a day (BID) | INTRAVENOUS | Status: DC

## 2019-02-12 MED ORDER — TORSEMIDE 20 MG PO TABS: 60.0000 mg | ORAL_TABLET | Freq: Two times a day (BID) | ORAL | 3 refills | Status: DC

## 2019-02-12 MED ORDER — MIDAZOLAM HCL 2 MG/2ML IJ SOLN
INTRAMUSCULAR | Status: AC
  Filled 2019-02-12: qty 2

## 2019-02-12 MED ORDER — HEPARIN (PORCINE) IN NACL 1000-0.9 UT/500ML-% IV SOLN
INTRAVENOUS | Status: DC | PRN
  Administered 2019-02-12 (×2): 500 mL

## 2019-02-12 MED ORDER — SODIUM CHLORIDE 0.9 % IV SOLN: 250.0000 mL | INTRAVENOUS | Status: DC | PRN

## 2019-02-12 MED ORDER — HEPARIN SODIUM (PORCINE) 1000 UNIT/ML IJ SOLN
INTRAMUSCULAR | Status: AC
  Filled 2019-02-12: qty 1

## 2019-02-12 MED ORDER — LABETALOL HCL 5 MG/ML IV SOLN: 10.0000 mg | INTRAVENOUS | Status: DC | PRN

## 2019-02-12 MED ORDER — MIDAZOLAM HCL 2 MG/2ML IJ SOLN
INTRAMUSCULAR | Status: DC | PRN
  Administered 2019-02-12: 1 mg via INTRAVENOUS

## 2019-02-12 MED ORDER — LIDOCAINE HCL (PF) 1 % IJ SOLN
INTRAMUSCULAR | Status: AC
  Filled 2019-02-12: qty 30

## 2019-02-12 MED ORDER — FENTANYL CITRATE (PF) 100 MCG/2ML IJ SOLN
INTRAMUSCULAR | Status: AC
  Filled 2019-02-12: qty 2

## 2019-02-12 MED ORDER — HEPARIN (PORCINE) IN NACL 1000-0.9 UT/500ML-% IV SOLN
INTRAVENOUS | Status: AC
  Filled 2019-02-12: qty 1000

## 2019-02-12 MED ORDER — IOHEXOL 350 MG/ML SOLN
INTRAVENOUS | Status: DC | PRN
  Administered 2019-02-12: 140 mL via INTRACARDIAC

## 2019-02-12 MED ORDER — HEPARIN SODIUM (PORCINE) 1000 UNIT/ML IJ SOLN
INTRAMUSCULAR | Status: DC | PRN
  Administered 2019-02-12: 6000 [IU] via INTRAVENOUS

## 2019-02-12 MED ORDER — FENTANYL CITRATE (PF) 100 MCG/2ML IJ SOLN
INTRAMUSCULAR | Status: DC | PRN
  Administered 2019-02-12: 25 ug via INTRAVENOUS

## 2019-02-12 MED ORDER — ASPIRIN 81 MG PO CHEW: 81.0000 mg | CHEWABLE_TABLET | ORAL | Status: DC

## 2019-02-12 MED ORDER — SODIUM CHLORIDE 0.9 % IV SOLN: INTRAVENOUS | Status: DC

## 2019-02-12 MED ORDER — VERAPAMIL HCL 2.5 MG/ML IV SOLN
INTRAVENOUS | Status: AC
  Filled 2019-02-12: qty 2

## 2019-02-12 MED ORDER — ONDANSETRON HCL 4 MG/2ML IJ SOLN: 4.0000 mg | Freq: Four times a day (QID) | INTRAMUSCULAR | Status: DC | PRN

## 2019-02-12 SURGICAL SUPPLY — 16 items
CATH 5FR JL3.5 JR4 ANG PIG MP (CATHETERS) ×2 IMPLANT
CATH BALLN WEDGE 5F 110CM (CATHETERS) ×2 IMPLANT
CATH INFINITI 5 FR 3DRC (CATHETERS) ×2 IMPLANT
CATH INFINITI 5FR AL1 (CATHETERS) ×2 IMPLANT
CATH LAUNCHER 5F RADR (CATHETERS) IMPLANT
CATHETER LAUNCHER 5F RADR (CATHETERS)
DEVICE RAD COMP TR BAND LRG (VASCULAR PRODUCTS) ×2 IMPLANT
GLIDESHEATH SLEND SS 6F .021 (SHEATH) ×2 IMPLANT
GUIDEWIRE .025 260CM (WIRE) ×2 IMPLANT
GUIDEWIRE INQWIRE 1.5J.035X260 (WIRE) ×1 IMPLANT
INQWIRE 1.5J .035X260CM (WIRE) ×2
KIT HEART LEFT (KITS) ×2 IMPLANT
PACK CARDIAC CATHETERIZATION (CUSTOM PROCEDURE TRAY) ×2 IMPLANT
SHEATH GLIDE SLENDER 4/5FR (SHEATH) ×2 IMPLANT
TRANSDUCER W/STOPCOCK (MISCELLANEOUS) ×2 IMPLANT
TUBING CIL FLEX 10 FLL-RA (TUBING) ×2 IMPLANT

## 2019-02-12 NOTE — Interval H&P Note (Signed)
History and Physical Interval Note:  02/12/2019 12:40 PM  Tamara Silva  has presented today for surgery, with the diagnosis of heart failure.  The various methods of treatment have been discussed with the patient and family. After consideration of risks, benefits and other options for treatment, the patient has consented to  Procedure(s): RIGHT/LEFT HEART CATH AND CORONARY ANGIOGRAPHY (N/A) as a surgical intervention.  The patient's history has been reviewed, patient examined, no change in status, stable for surgery.  I have reviewed the patient's chart and labs.  Questions were answered to the patient's satisfaction.     [name] [employer]

## 2019-02-12 NOTE — Discharge Instructions (Signed)
Radial Site Care ° °This sheet gives you information about how to care for yourself after your procedure. Your health care provider may also give you more specific instructions. If you have problems or questions, contact your health care provider. °What can I expect after the procedure? °After the procedure, it is common to have: °· Bruising and tenderness at the catheter insertion area. °Follow these instructions at home: °Medicines °· Take over-the-counter and prescription medicines only as told by your health care provider. °Insertion site care °· Follow instructions from your health care provider about how to take care of your insertion site. Make sure you: °? Wash your hands with soap and water before you change your bandage (dressing). If soap and water are not available, use hand sanitizer. °? Change your dressing as told by your health care provider. °? Leave stitches (sutures), skin glue, or adhesive strips in place. These skin closures may need to stay in place for 2 weeks or longer. If adhesive strip edges start to loosen and curl up, you may trim the loose edges. Do not remove adhesive strips completely unless your health care provider tells you to do that. °· Check your insertion site every day for signs of infection. Check for: °? Redness, swelling, or pain. °? Fluid or blood. °? Pus or a bad smell. °? Warmth. °· Do not take baths, swim, or use a hot tub until your health care provider approves. °· You may shower 24-48 hours after the procedure, or as directed by your health care provider. °? Remove the dressing and gently wash the site with plain soap and water. °? Pat the area dry with a clean towel. °? Do not rub the site. That could cause bleeding. °· Do not apply powder or lotion to the site. °Activity ° °· For 24 hours after the procedure, or as directed by your health care provider: °? Do not flex or bend the affected arm. °? Do not push or pull heavy objects with the affected arm. °? Do not  drive yourself home from the hospital or clinic. You may drive 24 hours after the procedure unless your health care provider tells you not to. °? Do not operate machinery or power tools. °· Do not lift anything that is heavier than 10 lb (4.5 kg), or the limit that you are told, until your health care provider says that it is safe. °· Ask your health care provider when it is okay to: °? Return to work or school. °? Resume usual physical activities or sports. °? Resume sexual activity. °General instructions °· If the catheter site starts to bleed, raise your arm and put firm pressure on the site. If the bleeding does not stop, get help right away. This is a medical emergency. °· If you went home on the same day as your procedure, a responsible adult should be with you for the first 24 hours after you arrive home. °· Keep all follow-up visits as told by your health care provider. This is important. °Contact a health care provider if: °· You have a fever. °· You have redness, swelling, or yellow drainage around your insertion site. °Get help right away if: °· You have unusual pain at the radial site. °· The catheter insertion area swells very fast. °· The insertion area is bleeding, and the bleeding does not stop when you hold steady pressure on the area. °· Your arm or hand becomes pale, cool, tingly, or numb. °These symptoms may represent a serious problem   that is an emergency. Do not wait to see if the symptoms will go away. Get medical help right away. Call your local emergency services (911 in the [place] Do not drive yourself to the hospital. °Summary °· After the procedure, it is common to have bruising and tenderness at the site. °· Follow instructions from your health care provider about how to take care of your radial site wound. Check the wound every day for signs of infection. °· Do not lift anything that is heavier than 10 lb (4.5 kg), or the limit that you are told, until your health care provider says  that it is safe. °This information is not intended to replace advice given to you by your health care provider. Make sure you discuss any questions you have with your health care provider. °Document Released: 06/19/2010 Document Revised: 06/22/2017 Document Reviewed: 06/22/2017 °Elsevier Patient Education © 2020 [employer] ° °

## 2019-02-12 NOTE — Progress Notes (Addendum)
Discharge instructions reviewed with pt and her husband (via Telephone) both voice understanding. Pt daughter called and informed pt can be discharged at 1700

## 2019-02-12 NOTE — Telephone Encounter (Signed)
Called patient to reiterate medication instructions per Dr Aundra Dubin. Pt is to not take evening dose of Torsemide today.  Tomorrow she will increase torsemide to 60mg  bid. LM for patient to call office

## 2019-02-13 ENCOUNTER — Encounter (HOSPITAL_COMMUNITY): Payer: Self-pay | Admitting: Cardiology

## 2019-02-13 NOTE — Telephone Encounter (Signed)
Pt aware to take torsemide 60mg  bid.  Pt reports she did not take last nights dose of torsemide. Verbalized understanding to take 60mg  twice a day.

## 2019-02-15 ENCOUNTER — Telehealth: Payer: Self-pay | Admitting: Internal Medicine

## 2019-02-15 NOTE — Telephone Encounter (Signed)
Patient called stating that she needs more insulin for 15 units per meal instead of 10. Please follow up.  States that her sugars are in the 400's

## 2019-02-15 NOTE — Telephone Encounter (Signed)
Follow up    Pt is calling back states she needs her novolog sent before the pharmacy closes at 6pm and her sugar is 400. Please f/u

## 2019-02-16 ENCOUNTER — Other Ambulatory Visit (HOSPITAL_COMMUNITY): Payer: Self-pay

## 2019-02-16 ENCOUNTER — Telehealth (HOSPITAL_COMMUNITY): Payer: Self-pay

## 2019-02-16 NOTE — Telephone Encounter (Signed)
I called Tamara Silva to confirm her address before I came for our meeting. I knew that she is planning to move soon so I wanted to make sure I arrive at the correct address. She stated she was still residing at the house on [address] but requested I not come until after 16:00 because the kids in the house would be in school until then. I asked if we could meet a little earlier since I have an appointment at 16:45 but she said she was unable to meet before 16:00. I agreed and will see her at 16:00.

## 2019-02-16 NOTE — Progress Notes (Signed)
Paramedicine Encounter    Patient ID: Tamara Silva, female    DOB: September 15, 1960, 58 y.o.   MRN: [id number]   Patient Care Team: Ladell Pier, MD as PCP - General (Internal Medicine) Lorretta Harp, MD as PCP - Cardiology (Cardiology) Larey Dresser, MD as PCP - Advanced Heart Failure (Cardiology)  Patient Active Problem List   Diagnosis Date Noted  . Diabetic peripheral neuropathy ([place]) 01/19/2019  . Former smoker 01/19/2019  . CKD (chronic kidney disease) stage 3, GFR 30-59 ml/min (HCC) 01/19/2019  . Cardiac arrest ([place]) 10/28/2018  . Seizure ([place]) 10/28/2018  . Noncompliance with treatment plan 07/24/2018  . General patient noncompliance 07/24/2018  . Noncompliance with diet and medication regimen 07/24/2018  . Dysarthria   . BiPAP (biphasic positive airway pressure) dependence   . Pancytopenia ([place]) 07/08/2018  . Type II diabetes mellitus with renal manifestations ([place]) 07/08/2018  . Hepatic steatosis 06/22/2018  . Chronic respiratory failure with hypoxia and hypercapnia (HCC)   . On home O2   . Slow transit constipation   . Cocaine abuse ([place])   . Diabetes mellitus type 2 in obese ([place])   . Generalized tonic-clonic seizure ([place]) 06/05/2018  . Cerebral embolism with cerebral infarction 06/01/2018  . Pressure injury of skin 05/30/2018  . Iron deficiency anemia 01/27/2018  . Tobacco abuse disorder 10/03/2017  . Dyslipidemia 04/29/2016  . Primary insomnia 04/29/2016  . Obesity hypoventilation syndrome ([place])   . OSA (obstructive sleep apnea)   . Dyslipidemia associated with type 2 diabetes mellitus ([place]) 05/21/2015  . COPD GOLD III  06/10/2014  . CAD S/P percutaneous coronary angioplasty - prior PCI to LAD; RCA PCI: new Xience Alpine DES 2.75 mm x 15 mm  10/07/2013  . Morbid obesity ([place]) 07/30/2013  . Chronic combined systolic and diastolic heart failure ([place]) 07/10/2013  . Chronic pulmonary embolism ([place]) 02/08/2013  . Bipolar disorder ([place]) 02/08/2013  .  Essential hypertension 02/08/2013  . COPD (chronic obstructive pulmonary disease) ([place]) 10/15/2012  . Pulmonary embolism on right ([place]) 10/15/2012    Current Outpatient Medications:  .  ACCU-CHEK AVIVA PLUS test strip, USE 1 STRIP TO CHECK GLUCOSE 3 TIMES DAILY (Patient taking differently: 1 each by Other route 3 (three) times daily. ), Disp: 100 each, Rfl: 2 .  acetaminophen (TYLENOL) 500 MG tablet, Take 500 mg by mouth every 6 (six) hours as needed for moderate pain or headache., Disp: , Rfl:  .  albuterol (PROAIR HFA) 108 (90 Base) MCG/ACT inhaler, INHALE 2 PUFFS BY MOUTH 4 TIMES DAILY AS NEEDED (Patient taking differently: Inhale 2 puffs into the lungs every 6 (six) hours as needed for wheezing or shortness of breath. ), Disp: 18 g, Rfl: 11 .  aspirin EC 81 MG tablet, Take 1 tablet (81 mg total) by mouth daily., Disp: 90 tablet, Rfl: 3 .  bisoprolol (ZEBETA) 5 MG tablet, Take 1 tablet (5 mg total) by mouth daily., Disp: 30 tablet, Rfl: 6 .  ferrous sulfate 325 (65 FE) MG tablet, Take 325 mg by mouth daily with breakfast., Disp: , Rfl:  .  hydrocerin (EUCERIN) CREA, Apply 1 application topically 2 (two) times daily., Disp: , Rfl: 0 .  insulin detemir (LEVEMIR) 100 UNIT/ML injection, Inject 20 units [place] BID. (Patient taking differently: Inject 20 Units into the skin 2 (two) times daily. ), Disp: 10 mL, Rfl: 2 .  ipratropium-albuterol (DUONEB) 0.5-2.5 (3) MG/3ML SOLN, Use nebulizer to inhale contents of one vial every 6 hours as needed for shortness  of breath, wheezing (Patient taking differently: Inhale 3 mLs into the lungs every 6 (six) hours as needed (shortness of breath). ), Disp: 360 mL, Rfl: 11 .  isosorbide-hydrALAZINE (BIDIL) 20-37.5 MG tablet, Take 0.5 tablets by mouth 3 (three) times daily., Disp: 45 tablet, Rfl: 3 .  liraglutide (VICTOZA) 18 MG/3ML SOPN, 0.6 ml subcut x 1 wk then increase to 1.2 ml daily (Patient taking differently: Inject 0.6-1.8 mg into the skin See admin instructions.  Inject 0.6 mg daily for 1 week, then 1.2 mg daily 1 week, then 1.8 mg daily), Disp: 3 mL, Rfl: 3 .  omeprazole (PRILOSEC) 40 MG capsule, Take 1 capsule (40 mg total) by mouth daily., Disp: 90 capsule, Rfl: 3 .  potassium chloride SA (K-DUR) 20 MEQ tablet, Take 1 tablet (20 mEq total) by mouth daily., Disp: 30 tablet, Rfl: 3 .  RESTASIS 0.05 % ophthalmic emulsion, Place 1 drop into both eyes 2 (two) times daily. , Disp: , Rfl:  .  rosuvastatin (CRESTOR) 40 MG tablet, Take 1 tablet (40 mg total) by mouth daily at 6 PM., Disp: 30 tablet, Rfl: 4 .  spironolactone (ALDACTONE) 25 MG tablet, Take 1 tablet (25 mg total) by mouth daily., Disp: 90 tablet, Rfl: 1 .  torsemide (DEMADEX) 20 MG tablet, Take 3 tablets (60 mg total) by mouth 2 (two) times daily., Disp: 150 tablet, Rfl: 3 .  vitamin B-12 (CYANOCOBALAMIN) 1000 MCG tablet, Take 1,000 mcg by mouth daily., Disp: , Rfl:  .  ferrous gluconate (FERGON) 324 MG tablet, Take 1 tablet (324 mg total) by mouth daily with breakfast. Office visit needed prior to future refills (Patient not taking: Reported on 02/08/2019), Disp: 30 tablet, Rfl: 0 .  insulin aspart (NOVOLOG FLEXPEN) 100 UNIT/ML FlexPen, Inject 10 units three times daily before each meal (Patient not taking: Reported on 02/16/2019), Disp: 15 mL, Rfl: 0 .  NEEDLE, DISP, 26 G 26G X 1/2" MISC, 1 application by Does not apply route daily., Disp: 100 each, Rfl: 0 .  PAZEO 0.7 % SOLN, Place 1 drop into both eyes daily. , Disp: , Rfl:  .  umeclidinium-vilanterol (ANORO ELLIPTA) 62.5-25 MCG/INH AEPB, Inhale 1 puff into the lungs daily. (Patient not taking: Reported on 02/16/2019), Disp: 1 each, Rfl: 11 Allergies  Allergen Reactions  . Metolazone Other (See Comments)    Dizziness and falling  . Plavix [Clopidogrel Bisulfate] Other (See Comments)    High PRU's - non-responder  . Ibuprofen Other (See Comments)    Pt states she is not supposed to take ibuprofen because of other meds she is taking  . Nsaids  Other (See Comments)    "I do not take NSAIDs d/t interfering with other meds"      Social History   Socioeconomic History  . Marital status: Single    Spouse name: Not on file  . Number of children: 1  . Years of education: Not on file  . Highest education level: Not on file  Occupational History  . Not on file  Social Needs  . Financial resource strain: Somewhat hard  . Food insecurity    Worry: Sometimes true    Inability: Sometimes true  . Transportation needs    Medical: Yes    Non-medical: Yes  Tobacco Use  . Smoking status: Former Smoker    Packs/day: 0.50    Years: 20.00    Pack years: 10.00    Types: Cigarettes    Quit date: 04/30/2014    Years since quitting:  4.8  . Smokeless tobacco: Never Used  . Tobacco comment: smoked off and on x 20 years  Substance and Sexual Activity  . Alcohol use: No    Alcohol/week: 0.0 standard drinks  . Drug use: Not Currently    Types: Cocaine  . Sexual activity: Not Currently  Lifestyle  . Physical activity    Days per week: 0 days    Minutes per session: 0 min  . Stress: Rather much  Relationships  . Social Herbalist on phone: Twice a week    Gets together: Once a week    Attends religious service: 1 to 4 times per year    Active member of club or organization: No    Attends meetings of clubs or organizations: Never    Relationship status: Living with partner  . Intimate partner violence    Fear of current or ex partner: Patient refused    Emotionally abused: Patient refused    Physically abused: Patient refused    Forced sexual activity: Patient refused  Other Topics Concern  . Not on file  Social History Narrative   Origibnally from [place]   Most recently from [place]   Daughter lives in town      On Social security to CHF, COPD, CAD    Physical Exam Cardiovascular:     Rate and Rhythm: Normal rate and regular rhythm.     Pulses: Normal pulses.  Pulmonary:     Effort: Pulmonary effort is  normal.     Breath sounds: Normal breath sounds.  Musculoskeletal: Normal range of motion.     Right lower leg: No edema.     Left lower leg: No edema.  Skin:    General: Skin is warm and dry.     Capillary Refill: Capillary refill takes less than 2 seconds.  Neurological:     Mental Status: She is alert and oriented to person, place, and time.  Psychiatric:        Mood and Affect: Mood normal.         Future Appointments  Date Time Provider [place]  02/27/2019 11:30 AM Lorretta Harp, MD [facility]  03/15/2019  2:00 PM [place] ECHO OP 1 [facility]  03/15/2019  3:00 PM Larey Dresser, MD MC-HVSC None  03/27/2019 11:10 AM Ladell Pier, MD CHW-CHWW None  04/03/2019  9:30 AM Elayne Snare, MD LBPC-LBENDO None   Today's Vitals   02/16/19 1621  BP: (!) 102/58  Pulse: 64  Resp: 18  SpO2: 98%  Weight: 278 lb 6.4 oz (126.3 kg)    Weight yesterday- did not weigh Last visit weight- 280 lb  Tamara Silva was seen at home today and reported feeling well. She denied chest pain, SOB, headache, dizziness, orthopnea, fever or cough since our last visit. She reported being compliant with her medications with the exception of Novolog and Anoro Ellipta which CHW is working on. Nothing further was needed and I will follow up next week unless something is needed sooner.  Jacquiline Doe, EMT 02/16/19  ACTION: Home visit completed Next visit planned for 1 week

## 2019-02-16 NOTE — Telephone Encounter (Signed)
I called Tamara Silva to schedule an appointment. She stated she was available for a meeting this afternoon so we agreed to meet at 15:00.

## 2019-02-19 ENCOUNTER — Telehealth: Payer: Self-pay

## 2019-02-19 ENCOUNTER — Telehealth: Payer: Self-pay | Admitting: Licensed Clinical Social Worker

## 2019-02-19 ENCOUNTER — Other Ambulatory Visit: Payer: Self-pay | Admitting: Family Medicine

## 2019-02-19 DIAGNOSIS — IMO0002 Reserved for concepts with insufficient information to code with codable children: Secondary | ICD-10-CM

## 2019-02-19 DIAGNOSIS — E1122 Type 2 diabetes mellitus with diabetic chronic kidney disease: Secondary | ICD-10-CM

## 2019-02-19 DIAGNOSIS — E1165 Type 2 diabetes mellitus with hyperglycemia: Secondary | ICD-10-CM

## 2019-02-19 NOTE — Telephone Encounter (Signed)
Call placed to patient. LCSW introduced self, explained role at [facility], and offered supportive services.  Pt reported that she felt unwell, sharing that her blood sugar today was 453 and she has not ate anything at all. Pt states that she has been out of her Novalog for approximately 1-2 weeks. Pt requests PCP send new prescription for 15 units instead of the previous 10 units sot that Medicaid will approve prescription.   LCSW informed pt that she will consult with CPP and RN CM about next steps. Pt was appreciative for the assistance.

## 2019-02-19 NOTE — Telephone Encounter (Signed)
Call placed to patient to inquire about her request for an increase in her novolog.  She explained that she has been out of novolog for a little over a week. During that time her blood sugars have been creeping up. She confirmed that she continues to take levemir 20 units twice daily and victoza which she is on week 2 of her plan. She said that prior to running out of novolog, her blood sugars were high 200's/290 then in the past week have increased to 300's, 400's now 459.  She is worried that her blood sugars will increase even more if she eats, so she has been hesitant about eating. She is wondering if the dose of novolog should be increased. The order is 10 units three times daily. Informed her that this CM would discuss with Benard Halsted, RPH and either [facility] or this CM would call her back.   Call placed to [place], spoke to [place], [facility]. He confirmed that the patient received the victoza on 02/08/2019.  He also explained that he has a prescription for novolog 10 units 3 times daily but the patient has requested an increase in her dose. She has been taking more that 10 units three times daily.  He said that she usually asks for a refill prior to when the refill is due.. She last received the novolog 12/2018. Informed him that this CM would discuss with Lurena Joiner, [facility] he he would be notified how to proceed with the refill.

## 2019-02-19 NOTE — Telephone Encounter (Signed)
Patient to obtain Novolog from pharmacy. She knows to take as prescribed. She is to increase Victoza to 1.8 mg daily starting this Friday (9/25).  I will touch base with her in two weeks (03/02/19) to evaluate home sugars.

## 2019-02-23 ENCOUNTER — Other Ambulatory Visit: Payer: Self-pay | Admitting: Family Medicine

## 2019-02-23 ENCOUNTER — Other Ambulatory Visit (HOSPITAL_COMMUNITY): Payer: Self-pay

## 2019-02-23 DIAGNOSIS — I25119 Atherosclerotic heart disease of native coronary artery with unspecified angina pectoris: Secondary | ICD-10-CM

## 2019-02-23 NOTE — Progress Notes (Signed)
Paramedicine Encounter    Patient ID: Tamara Silva, female    DOB: 1960-07-20, 58 y.o.   MRN: [id number]   Patient Care Team: Ladell Pier, MD as PCP - General (Internal Medicine) Lorretta Harp, MD as PCP - Cardiology (Cardiology) Larey Dresser, MD as PCP - Advanced Heart Failure (Cardiology)  Patient Active Problem List   Diagnosis Date Noted  . Diabetic peripheral neuropathy ([place]) 01/19/2019  . Former smoker 01/19/2019  . CKD (chronic kidney disease) stage 3, GFR 30-59 ml/min (HCC) 01/19/2019  . Cardiac arrest ([place]) 10/28/2018  . Seizure ([place]) 10/28/2018  . Noncompliance with treatment plan 07/24/2018  . General patient noncompliance 07/24/2018  . Noncompliance with diet and medication regimen 07/24/2018  . Dysarthria   . BiPAP (biphasic positive airway pressure) dependence   . Pancytopenia ([place]) 07/08/2018  . Type II diabetes mellitus with renal manifestations ([place]) 07/08/2018  . Hepatic steatosis 06/22/2018  . Chronic respiratory failure with hypoxia and hypercapnia (HCC)   . On home O2   . Slow transit constipation   . Cocaine abuse ([place])   . Diabetes mellitus type 2 in obese ([place])   . Generalized tonic-clonic seizure ([place]) 06/05/2018  . Cerebral embolism with cerebral infarction 06/01/2018  . Pressure injury of skin 05/30/2018  . Iron deficiency anemia 01/27/2018  . Tobacco abuse disorder 10/03/2017  . Dyslipidemia 04/29/2016  . Primary insomnia 04/29/2016  . Obesity hypoventilation syndrome ([place])   . OSA (obstructive sleep apnea)   . Dyslipidemia associated with type 2 diabetes mellitus ([place]) 05/21/2015  . COPD GOLD III  06/10/2014  . CAD S/P percutaneous coronary angioplasty - prior PCI to LAD; RCA PCI: new Xience Alpine DES 2.75 mm x 15 mm  10/07/2013  . Morbid obesity ([place]) 07/30/2013  . Chronic combined systolic and diastolic heart failure ([place]) 07/10/2013  . Chronic pulmonary embolism ([place]) 02/08/2013  . Bipolar disorder ([place]) 02/08/2013  .  Essential hypertension 02/08/2013  . COPD (chronic obstructive pulmonary disease) ([place]) 10/15/2012  . Pulmonary embolism on right ([place]) 10/15/2012    Current Outpatient Medications:  .  ACCU-CHEK AVIVA PLUS test strip, USE 1 STRIP TO CHECK BLOOD GLUCOSE 3 TIMES A DAY, Disp: 100 each, Rfl: 2 .  acetaminophen (TYLENOL) 500 MG tablet, Take 500 mg by mouth every 6 (six) hours as needed for moderate pain or headache., Disp: , Rfl:  .  albuterol (PROAIR HFA) 108 (90 Base) MCG/ACT inhaler, INHALE 2 PUFFS BY MOUTH 4 TIMES DAILY AS NEEDED (Patient taking differently: Inhale 2 puffs into the lungs every 6 (six) hours as needed for wheezing or shortness of breath. ), Disp: 18 g, Rfl: 11 .  aspirin EC 81 MG tablet, Take 1 tablet (81 mg total) by mouth daily., Disp: 90 tablet, Rfl: 3 .  bisoprolol (ZEBETA) 5 MG tablet, Take 1 tablet (5 mg total) by mouth daily., Disp: 30 tablet, Rfl: 6 .  ferrous gluconate (FERGON) 324 MG tablet, Take 1 tablet (324 mg total) by mouth daily with breakfast. Office visit needed prior to future refills (Patient not taking: Reported on 02/08/2019), Disp: 30 tablet, Rfl: 0 .  ferrous sulfate 325 (65 FE) MG tablet, Take 325 mg by mouth daily with breakfast., Disp: , Rfl:  .  hydrocerin (EUCERIN) CREA, Apply 1 application topically 2 (two) times daily., Disp: , Rfl: 0 .  insulin aspart (NOVOLOG FLEXPEN) 100 UNIT/ML FlexPen, Inject 10 units three times daily before each meal (Patient not taking: Reported on 02/16/2019), Disp: 15 mL, Rfl: 0 .  insulin detemir (LEVEMIR) 100 UNIT/ML injection, Inject 20 units [place] BID. (Patient taking differently: Inject 20 Units into the skin 2 (two) times daily. ), Disp: 10 mL, Rfl: 2 .  ipratropium-albuterol (DUONEB) 0.5-2.5 (3) MG/3ML SOLN, Use nebulizer to inhale contents of one vial every 6 hours as needed for shortness of breath, wheezing (Patient taking differently: Inhale 3 mLs into the lungs every 6 (six) hours as needed (shortness of breath). ), Disp:  360 mL, Rfl: 11 .  isosorbide-hydrALAZINE (BIDIL) 20-37.5 MG tablet, Take 0.5 tablets by mouth 3 (three) times daily., Disp: 45 tablet, Rfl: 3 .  liraglutide (VICTOZA) 18 MG/3ML SOPN, 0.6 ml subcut x 1 wk then increase to 1.2 ml daily (Patient taking differently: Inject 0.6-1.8 mg into the skin See admin instructions. Inject 0.6 mg daily for 1 week, then 1.2 mg daily 1 week, then 1.8 mg daily), Disp: 3 mL, Rfl: 3 .  NEEDLE, DISP, 26 G 26G X 1/2" MISC, 1 application by Does not apply route daily., Disp: 100 each, Rfl: 0 .  omeprazole (PRILOSEC) 40 MG capsule, Take 1 capsule (40 mg total) by mouth daily., Disp: 90 capsule, Rfl: 3 .  PAZEO 0.7 % SOLN, Place 1 drop into both eyes daily. , Disp: , Rfl:  .  potassium chloride SA (K-DUR) 20 MEQ tablet, Take 1 tablet (20 mEq total) by mouth daily., Disp: 30 tablet, Rfl: 3 .  RESTASIS 0.05 % ophthalmic emulsion, Place 1 drop into both eyes 2 (two) times daily. , Disp: , Rfl:  .  rosuvastatin (CRESTOR) 40 MG tablet, Take 1 tablet (40 mg total) by mouth daily at 6 PM., Disp: 30 tablet, Rfl: 4 .  spironolactone (ALDACTONE) 25 MG tablet, Take 1 tablet (25 mg total) by mouth daily., Disp: 90 tablet, Rfl: 1 .  torsemide (DEMADEX) 20 MG tablet, Take 3 tablets (60 mg total) by mouth 2 (two) times daily., Disp: 150 tablet, Rfl: 3 .  umeclidinium-vilanterol (ANORO ELLIPTA) 62.5-25 MCG/INH AEPB, Inhale 1 puff into the lungs daily. (Patient not taking: Reported on 02/16/2019), Disp: 1 each, Rfl: 11 .  vitamin B-12 (CYANOCOBALAMIN) 1000 MCG tablet, Take 1,000 mcg by mouth daily., Disp: , Rfl:  Allergies  Allergen Reactions  . Metolazone Other (See Comments)    Dizziness and falling  . Plavix [Clopidogrel Bisulfate] Other (See Comments)    High PRU's - non-responder  . Ibuprofen Other (See Comments)    Pt states she is not supposed to take ibuprofen because of other meds she is taking  . Nsaids Other (See Comments)    "I do not take NSAIDs d/t interfering with other  meds"      Social History   Socioeconomic History  . Marital status: Single    Spouse name: Not on file  . Number of children: 1  . Years of education: Not on file  . Highest education level: Not on file  Occupational History  . Not on file  Social Needs  . Financial resource strain: Somewhat hard  . Food insecurity    Worry: Sometimes true    Inability: Sometimes true  . Transportation needs    Medical: Yes    Non-medical: Yes  Tobacco Use  . Smoking status: Former Smoker    Packs/day: 0.50    Years: 20.00    Pack years: 10.00    Types: Cigarettes    Quit date: 04/30/2014    Years since quitting: 4.8  . Smokeless tobacco: Never Used  . Tobacco comment: smoked off  and on x 20 years  Substance and Sexual Activity  . Alcohol use: No    Alcohol/week: 0.0 standard drinks  . Drug use: Not Currently    Types: Cocaine  . Sexual activity: Not Currently  Lifestyle  . Physical activity    Days per week: 0 days    Minutes per session: 0 min  . Stress: Rather much  Relationships  . Social Herbalist on phone: Twice a week    Gets together: Once a week    Attends religious service: 1 to 4 times per year    Active member of club or organization: No    Attends meetings of clubs or organizations: Never    Relationship status: Living with partner  . Intimate partner violence    Fear of current or ex partner: Patient refused    Emotionally abused: Patient refused    Physically abused: Patient refused    Forced sexual activity: Patient refused  Other Topics Concern  . Not on file  Social History Narrative   Origibnally from [place]   Most recently from [place]   Daughter lives in town      On Social security to CHF, COPD, CAD    Physical Exam Cardiovascular:     Rate and Rhythm: Normal rate and regular rhythm.     Pulses: Normal pulses.  Pulmonary:     Effort: Pulmonary effort is normal.     Breath sounds: Normal breath sounds.  Abdominal:      General: There is no distension.  Musculoskeletal: Normal range of motion.     Right lower leg: No edema.     Left lower leg: No edema.  Skin:    General: Skin is warm and dry.     Capillary Refill: Capillary refill takes less than 2 seconds.  Neurological:     Mental Status: She is alert and oriented to person, place, and time.  Psychiatric:        Mood and Affect: Mood normal.         Future Appointments  Date Time Provider [place]  02/27/2019 11:30 AM Lorretta Harp, MD [facility]  03/15/2019  2:00 PM [place] ECHO OP 1 [facility]ECH[facility]  03/15/2019  3:00 PM Larey Dresser, MD MC-[facility]SC None  03/27/2019 11:10 AM Ladell Pier, MD CHW-[facility]WW None  04/03/2019  9:30 AM Elayne Snare, MD LB[facility]END[facility] None    BP 114/64 (BP Location: Left Arm, Patient Position: Sitting, Cuff Size: Large)   Pulse 72   Resp 16   Wt 279 lb 12.8 oz (126.9 kg)   LMP 07/28/2013 Comment: perimenopausal  SpO2 94%   BMI 43.18 kg/m   Weight yesterday- did not weigh Last visit weight- 278.4 lb  Tamara Silva was seen at home today and reported feeling well. She denied chest pain, SOB, headache, dizziness, orthopnea, fever or cough since our last visit. She stated she has been compliant with her medications over the past week and her weight is in line with last week however she has not been weighing daily. I expressed the importance of daily weights to her and she said she understood. Her medications were verified and we agreed to meet towards the end of next week. I will follow up sooner if it becomes necessary.   Jacquiline Doe, EMT 02/23/19  ACTION: Home visit completed Next visit planned for 1 week

## 2019-02-27 ENCOUNTER — Other Ambulatory Visit: Payer: Self-pay

## 2019-02-27 ENCOUNTER — Ambulatory Visit (INDEPENDENT_AMBULATORY_CARE_PROVIDER_SITE_OTHER): Payer: Medicaid Other | Admitting: Cardiovascular Disease

## 2019-02-27 ENCOUNTER — Encounter: Payer: Self-pay | Admitting: Cardiovascular Disease

## 2019-02-27 DIAGNOSIS — E785 Hyperlipidemia, unspecified: Secondary | ICD-10-CM

## 2019-02-27 DIAGNOSIS — I5042 Chronic combined systolic (congestive) and diastolic (congestive) heart failure: Secondary | ICD-10-CM | POA: Diagnosis not present

## 2019-02-27 DIAGNOSIS — Z9861 Coronary angioplasty status: Secondary | ICD-10-CM

## 2019-02-27 DIAGNOSIS — E662 Morbid (severe) obesity with alveolar hypoventilation: Secondary | ICD-10-CM | POA: Diagnosis not present

## 2019-02-27 DIAGNOSIS — I10 Essential (primary) hypertension: Secondary | ICD-10-CM | POA: Diagnosis not present

## 2019-02-27 DIAGNOSIS — E1169 Type 2 diabetes mellitus with other specified complication: Secondary | ICD-10-CM

## 2019-02-27 DIAGNOSIS — I2510 Atherosclerotic heart disease of native coronary artery without angina pectoris: Secondary | ICD-10-CM

## 2019-02-27 NOTE — Progress Notes (Signed)
02/27/2019 Tamara Silva   01-30-1961  [id number]  Primary Physician Ladell Pier, MD Primary Cardiologist: Lorretta Harp MD [name], [place]  HPI:  Tamara Silva is a 58 y.o.  morbidly overweight divorced African-American female mother of 64, grandmother of 2 grandchildren who I am seeing back after her recent multiple hospitalizations.  I last saw her in the office 08/16/2018.  She is accompanied by her husband Regie today. She has been followed by Dr. Aundra Dubin in the heart failure clinic most recently.  She is a long history tobacco abuse having recently discontinued in January, treated hypertension, diabetes and hyperlipidemia.  Her first heart attack was in 2006 status post stenting at [facility] in [place].  She had a myocardial infarction 3 years later in 2009 and had stenting at [facility].  Her most recent intervention was performed by Dr. Ellyn Hack in 2015 with a Xience drug-eluting stent to her distal RCA.  Her LAD stent had mild in-stent restenosis in her EF at that time was in the 30% range.  She was placed on low-dose aspirin and Prasugrel.  She was admitted 3 times since late December with profound anemia, and combined systolic and diastolic heart failure requiring transfusion and diuresis.  Her Prasugrel  was discontinued at that time.  .  She has been in the mid to the hospital several times since I last saw her with volume overload and PEA arrest requiring CPR (5/20).  She recently underwent right left heart cath by Dr. Algernon Huxley 02/12/2019 revealing patent stents with ostial diagonal branch disease and fairly normal filling pressures with moderate pulmonary hypertension.  The possibility of ICD implantation for primary prevention was raised.  She feels clinically improved.  She denies chest pain or shortness of breath.  She does watch her diet and weigh herself every other day.  She also has home health come to her house.    Current Meds  Medication Sig  .  ACCU-CHEK AVIVA PLUS test strip USE 1 STRIP TO CHECK BLOOD GLUCOSE 3 TIMES A DAY  . acetaminophen (TYLENOL) 500 MG tablet Take 500 mg by mouth every 6 (six) hours as needed for moderate pain or headache.  . albuterol (PROAIR HFA) 108 (90 Base) MCG/ACT inhaler INHALE 2 PUFFS BY MOUTH 4 TIMES DAILY AS NEEDED (Patient taking differently: Inhale 2 puffs into the lungs every 6 (six) hours as needed for wheezing or shortness of breath. )  . aspirin EC 81 MG tablet Take 1 tablet (81 mg total) by mouth daily.  . bisoprolol (ZEBETA) 5 MG tablet Take 1 tablet (5 mg total) by mouth daily.  . ferrous gluconate (FERGON) 324 MG tablet Take 1 tablet (324 mg total) by mouth daily with breakfast. Office visit needed prior to future refills  . hydrocerin (EUCERIN) CREA Apply 1 application topically 2 (two) times daily.  . insulin aspart (NOVOLOG FLEXPEN) 100 UNIT/ML FlexPen Inject 10 units three times daily before each meal  . insulin detemir (LEVEMIR) 100 UNIT/ML injection Inject 20 units [place] BID. (Patient taking differently: Inject 20 Units into the skin 2 (two) times daily. )  . ipratropium-albuterol (DUONEB) 0.5-2.5 (3) MG/3ML SOLN Use nebulizer to inhale contents of one vial every 6 hours as needed for shortness of breath, wheezing (Patient taking differently: Inhale 3 mLs into the lungs every 6 (six) hours as needed (shortness of breath). )  . isosorbide-hydrALAZINE (BIDIL) 20-37.5 MG tablet Take 0.5 tablets by mouth 3 (three) times daily.  [name] Kitchen liraglutide (  VICTOZA) 18 MG/3ML SOPN 0.6 ml subcut x 1 wk then increase to 1.2 ml daily (Patient taking differently: Inject 0.6-1.8 mg into the skin See admin instructions. Inject 0.6 mg daily for 1 week, then 1.2 mg daily 1 week, then 1.8 mg daily)  . NEEDLE, DISP, 26 G 26G X 1/2" MISC 1 application by Does not apply route daily.  [name] Kitchen omeprazole (PRILOSEC) 40 MG capsule Take 1 capsule (40 mg total) by mouth daily.  [name] Kitchen PAZEO 0.7 % SOLN Place 1 drop into both eyes daily.   .  potassium chloride SA (K-DUR) 20 MEQ tablet Take 1 tablet (20 mEq total) by mouth daily.  . RESTASIS 0.05 % ophthalmic emulsion Place 1 drop into both eyes 2 (two) times daily.   . rosuvastatin (CRESTOR) 40 MG tablet TAKE 1 TABLET (40 MG TOTAL) BY MOUTH DAILY AT 6 PM.  . spironolactone (ALDACTONE) 25 MG tablet Take 1 tablet (25 mg total) by mouth daily.  [name] Kitchen torsemide (DEMADEX) 20 MG tablet Take 3 tablets (60 mg total) by mouth 2 (two) times daily.  [name] Kitchen umeclidinium-vilanterol (ANORO ELLIPTA) 62.5-25 MCG/INH AEPB Inhale 1 puff into the lungs daily.  . vitamin B-12 (CYANOCOBALAMIN) 1000 MCG tablet Take 1,000 mcg by mouth daily.     Allergies  Allergen Reactions  . Metolazone Other (See Comments)    Dizziness and falling  . Plavix [Clopidogrel Bisulfate] Other (See Comments)    High PRU's - non-responder  . Ibuprofen Other (See Comments)    Pt states she is not supposed to take ibuprofen because of other meds she is taking  . Nsaids Other (See Comments)    "I do not take NSAIDs d/t interfering with other meds"    Social History   Socioeconomic History  . Marital status: Single    Spouse name: Not on file  . Number of children: 1  . Years of education: Not on file  . Highest education level: Not on file  Occupational History  . Not on file  Social Needs  . Financial resource strain: Somewhat hard  . Food insecurity    Worry: Sometimes true    Inability: Sometimes true  . Transportation needs    Medical: Yes    Non-medical: Yes  Tobacco Use  . Smoking status: Former Smoker    Packs/day: 0.50    Years: 20.00    Pack years: 10.00    Types: Cigarettes    Quit date: 04/30/2014    Years since quitting: 4.8  . Smokeless tobacco: Never Used  . Tobacco comment: smoked off and on x 20 years  Substance and Sexual Activity  . Alcohol use: No    Alcohol/week: 0.0 standard drinks  . Drug use: Not Currently    Types: Cocaine  . Sexual activity: Not Currently  Lifestyle  . Physical  activity    Days per week: 0 days    Minutes per session: 0 min  . Stress: Rather much  Relationships  . Social Herbalist on phone: Twice a week    Gets together: Once a week    Attends religious service: 1 to 4 times per year    Active member of club or organization: No    Attends meetings of clubs or organizations: Never    Relationship status: Living with partner  . Intimate partner violence    Fear of current or ex partner: Patient refused    Emotionally abused: Patient refused    Physically abused: Patient refused  Forced sexual activity: Patient refused  Other Topics Concern  . Not on file  Social History Narrative   Origibnally from [place]   Most recently from [place]   Daughter lives in town      On Social security to CHF, COPD, CAD     Review of Systems: General: negative for chills, fever, night sweats or weight changes.  Cardiovascular: negative for chest pain, dyspnea on exertion, edema, orthopnea, palpitations, paroxysmal nocturnal dyspnea or shortness of breath Dermatological: negative for rash Respiratory: negative for cough or wheezing Urologic: negative for hematuria Abdominal: negative for nausea, vomiting, diarrhea, bright red blood per rectum, melena, or hematemesis Neurologic: negative for visual changes, syncope, or dizziness All other systems reviewed and are otherwise negative except as noted above.    Blood pressure 115/74, pulse 61, temperature 97.9 F (36.6 C), height 5' 7.5" (1.715 m), weight 279 lb (126.6 kg), last menstrual period 07/28/2013, SpO2 95 %.  General appearance: alert and no distress Neck: no adenopathy, no carotid bruit, no JVD, supple, symmetrical, trachea midline and thyroid not enlarged, symmetric, no tenderness/mass/nodules Lungs: clear to auscultation bilaterally Heart: regular rate and rhythm, S1, S2 normal, no murmur, click, rub or gallop Extremities: extremities normal, atraumatic, no cyanosis or edema  Pulses: 2+ and symmetric Skin: Skin color, texture, turgor normal. No rashes or lesions Neurologic: Alert and oriented X 3, normal strength and tone. Normal symmetric reflexes. Normal coordination and gait  EKG not performed today  ASSESSMENT AND PLAN:   Essential hypertension History of essential hypertension with blood pressure measured today at 74.  She is on Zebeta, BiDil, and spironolactone.  Chronic combined systolic and diastolic heart failure (HCC) History of chronic systolic and diastolic heart failure with multiple admissions for volume overload.  She has had PEA arrest.  She recently underwent right left heart cath by Dr. Aundra Dubin 02/12/2019 revealing ostial diagonal branch disease but otherwise no significant CAD.  Her cardiac index was 3.12 L/min/m and her wedge pressure was 19 with a moderately elevated PA pressure of 66.  She is on oral diuretics and followed closely by Dr. Aundra Dubin.  Her last 2D echo performed 10/26/2018 revealed ejection fraction of 30 to 35%.  She may be a candidate for ICD implantation for primary prevention.  CAD S/P percutaneous coronary angioplasty - prior PCI to LAD; RCA PCI: new Xience Alpine DES 2.75 mm x 15 mm  History of CAD status post myocardial infarction 2006 treated with stenting at [facility] in [place].  She had a acute myocardial infarction 3 years later in 2009 with stenting at [place] with a Xience drug-eluting stent to her distal RCA.  Her LAD stent had mild in-stent restenosis and her EF was 30% at that time.  Recent cath 2 weeks ago revealed patent stents with ostial diagonal branch disease suggesting that her cardiomyopathy is not ischemically mediated.  Obesity hypoventilation syndrome (HCC) History of obesity hypoventilation syndrome on nocturnal BiPAP  Dyslipidemia associated with type 2 diabetes mellitus ([place]) History of dyslipidemia on statin therapy with lipid profile performed 02/07/2019 revealing total cholesterol 137, LDL 52 and HDL Ford MD [facility] [place], [facility] 02/27/2019 12:21 PM

## 2019-02-27 NOTE — Assessment & Plan Note (Signed)
History of CAD status post myocardial infarction 2006 treated with stenting at [facility] in [place].  She had a acute myocardial infarction 3 years later in 2009 with stenting at [place] with a Xience drug-eluting stent to her distal RCA.  Her LAD stent had mild in-stent restenosis and her EF was 30% at that time.  Recent cath 2 weeks ago revealed patent stents with ostial diagonal branch disease suggesting that her cardiomyopathy is not ischemically mediated.

## 2019-02-27 NOTE — Patient Instructions (Signed)
Medication Instructions:  Your physician recommends that you continue on your current medications as directed. Please refer to the Current Medication list given to you today.  If you need a refill on your cardiac medications before your next appointment, please call your pharmacy.   Lab work: NONE If you have labs (blood work) drawn today and your tests are completely normal, you will receive your results only by: [name] Kitchen MyChart Message (if you have MyChart) OR . A paper copy in the mail If you have any lab test that is abnormal or we need to change your treatment, we will call you to review the results.  Testing/Procedures: NONE  Follow-Up: At [facility], you and your health needs are our priority.  As part of our continuing mission to provide you with exceptional heart care, we have created designated Provider Care Teams.  These Care Teams include your primary Cardiologist (physician) and Advanced Practice Providers (APPs -  Physician Assistants and Nurse Practitioners) who all work together to provide you with the care you need, when you need it. You will need a follow up appointment in 6 months with Dr. Quay Burow.  Please call our office 2 months in advance to schedule this appointment.

## 2019-02-27 NOTE — Assessment & Plan Note (Signed)
History of essential hypertension with blood pressure measured today at 74.  She is on Zebeta, BiDil, and spironolactone.

## 2019-02-27 NOTE — Assessment & Plan Note (Signed)
History of dyslipidemia on statin therapy with lipid profile performed 02/07/2019 revealing total cholesterol 137, LDL 52 and HDL 56

## 2019-02-27 NOTE — Assessment & Plan Note (Signed)
History of obesity hypoventilation syndrome on nocturnal BiPAP 

## 2019-02-27 NOTE — Assessment & Plan Note (Signed)
History of chronic systolic and diastolic heart failure with multiple admissions for volume overload.  She has had PEA arrest.  She recently underwent right left heart cath by Dr. Aundra Dubin 02/12/2019 revealing ostial diagonal branch disease but otherwise no significant CAD.  Her cardiac index was 3.12 L/min/m and her wedge pressure was 19 with a moderately elevated PA pressure of 66.  She is on oral diuretics and followed closely by Dr. Aundra Dubin.  Her last 2D echo performed 10/26/2018 revealed ejection fraction of 30 to 35%.  She may be a candidate for ICD implantation for primary prevention.

## 2019-02-27 NOTE — Progress Notes (Signed)
Make sure she has followup with [place]

## 2019-03-02 ENCOUNTER — Other Ambulatory Visit (HOSPITAL_COMMUNITY): Payer: Self-pay

## 2019-03-02 NOTE — Progress Notes (Addendum)
Paramedicine Encounter    Patient ID: Tamara Silva, female    DOB: 1960-09-02, 58 y.o.   MRN: [id number]   Patient Care Team: Ladell Pier, MD as PCP - General (Internal Medicine) Lorretta Harp, MD as PCP - Cardiology (Cardiology) Larey Dresser, MD as PCP - Advanced Heart Failure (Cardiology)  Patient Active Problem List   Diagnosis Date Noted  . Diabetic peripheral neuropathy ([place]) 01/19/2019  . Former smoker 01/19/2019  . CKD (chronic kidney disease) stage 3, GFR 30-59 ml/min 01/19/2019  . Cardiac arrest ([place]) 10/28/2018  . Seizure ([place]) 10/28/2018  . Noncompliance with treatment plan 07/24/2018  . General patient noncompliance 07/24/2018  . Noncompliance with diet and medication regimen 07/24/2018  . Dysarthria   . BiPAP (biphasic positive airway pressure) dependence   . Pancytopenia ([place]) 07/08/2018  . Type II diabetes mellitus with renal manifestations ([place]) 07/08/2018  . Hepatic steatosis 06/22/2018  . Chronic respiratory failure with hypoxia and hypercapnia (HCC)   . On home O2   . Slow transit constipation   . Cocaine abuse ([place])   . Diabetes mellitus type 2 in obese ([place])   . Generalized tonic-clonic seizure ([place]) 06/05/2018  . Cerebral embolism with cerebral infarction 06/01/2018  . Pressure injury of skin 05/30/2018  . Iron deficiency anemia 01/27/2018  . Tobacco abuse disorder 10/03/2017  . Dyslipidemia 04/29/2016  . Primary insomnia 04/29/2016  . Obesity hypoventilation syndrome ([place])   . OSA (obstructive sleep apnea)   . Dyslipidemia associated with type 2 diabetes mellitus ([place]) 05/21/2015  . COPD GOLD III  06/10/2014  . CAD S/P percutaneous coronary angioplasty - prior PCI to LAD; RCA PCI: new Xience Alpine DES 2.75 mm x 15 mm  10/07/2013  . Morbid obesity ([place]) 07/30/2013  . Chronic combined systolic and diastolic heart failure ([place]) 07/10/2013  . Chronic pulmonary embolism ([place]) 02/08/2013  . Bipolar disorder ([place]) 02/08/2013  . Essential  hypertension 02/08/2013  . COPD (chronic obstructive pulmonary disease) ([place]) 10/15/2012  . Pulmonary embolism on right ([place]) 10/15/2012    Current Outpatient Medications:  .  ACCU-CHEK AVIVA PLUS test strip, USE 1 STRIP TO CHECK BLOOD GLUCOSE 3 TIMES A DAY, Disp: 100 each, Rfl: 2 .  acetaminophen (TYLENOL) 500 MG tablet, Take 500 mg by mouth every 6 (six) hours as needed for moderate pain or headache., Disp: , Rfl:  .  albuterol (PROAIR HFA) 108 (90 Base) MCG/ACT inhaler, INHALE 2 PUFFS BY MOUTH 4 TIMES DAILY AS NEEDED (Patient taking differently: Inhale 2 puffs into the lungs every 6 (six) hours as needed for wheezing or shortness of breath. ), Disp: 18 g, Rfl: 11 .  aspirin EC 81 MG tablet, Take 1 tablet (81 mg total) by mouth daily., Disp: 90 tablet, Rfl: 3 .  bisoprolol (ZEBETA) 5 MG tablet, Take 1 tablet (5 mg total) by mouth daily., Disp: 30 tablet, Rfl: 6 .  ferrous sulfate 325 (65 FE) MG tablet, Take 325 mg by mouth daily with breakfast., Disp: , Rfl:  .  insulin aspart (NOVOLOG FLEXPEN) 100 UNIT/ML FlexPen, Inject 10 units three times daily before each meal, Disp: 15 mL, Rfl: 0 .  insulin detemir (LEVEMIR) 100 UNIT/ML injection, Inject 20 units [place] BID. (Patient taking differently: Inject 20 Units into the skin 2 (two) times daily. ), Disp: 10 mL, Rfl: 2 .  ipratropium-albuterol (DUONEB) 0.5-2.5 (3) MG/3ML SOLN, Use nebulizer to inhale contents of one vial every 6 hours as needed for shortness of breath, wheezing (Patient taking differently:  Inhale 3 mLs into the lungs every 6 (six) hours as needed (shortness of breath). ), Disp: 360 mL, Rfl: 11 .  isosorbide-hydrALAZINE (BIDIL) 20-37.5 MG tablet, Take 0.5 tablets by mouth 3 (three) times daily., Disp: 45 tablet, Rfl: 3 .  liraglutide (VICTOZA) 18 MG/3ML SOPN, 0.6 ml subcut x 1 wk then increase to 1.2 ml daily (Patient taking differently: Inject 0.6-1.8 mg into the skin See admin instructions. Inject 0.6 mg daily for 1 week, then 1.2 mg  daily 1 week, then 1.8 mg daily), Disp: 3 mL, Rfl: 3 .  omeprazole (PRILOSEC) 40 MG capsule, Take 1 capsule (40 mg total) by mouth daily., Disp: 90 capsule, Rfl: 3 .  potassium chloride SA (K-DUR) 20 MEQ tablet, Take 1 tablet (20 mEq total) by mouth daily., Disp: 30 tablet, Rfl: 3 .  RESTASIS 0.05 % ophthalmic emulsion, Place 1 drop into both eyes 2 (two) times daily. , Disp: , Rfl:  .  rosuvastatin (CRESTOR) 40 MG tablet, TAKE 1 TABLET (40 MG TOTAL) BY MOUTH DAILY AT 6 PM., Disp: 30 tablet, Rfl: 3 .  spironolactone (ALDACTONE) 25 MG tablet, Take 1 tablet (25 mg total) by mouth daily., Disp: 90 tablet, Rfl: 1 .  torsemide (DEMADEX) 20 MG tablet, Take 3 tablets (60 mg total) by mouth 2 (two) times daily., Disp: 150 tablet, Rfl: 3 .  vitamin B-12 (CYANOCOBALAMIN) 1000 MCG tablet, Take 1,000 mcg by mouth daily., Disp: , Rfl:  .  ferrous gluconate (FERGON) 324 MG tablet, Take 1 tablet (324 mg total) by mouth daily with breakfast. Office visit needed prior to future refills, Disp: 30 tablet, Rfl: 0 .  hydrocerin (EUCERIN) CREA, Apply 1 application topically 2 (two) times daily. (Patient not taking: Reported on 03/02/2019), Disp: , Rfl: 0 .  NEEDLE, DISP, 26 G 26G X 1/2" MISC, 1 application by Does not apply route daily., Disp: 100 each, Rfl: 0 .  PAZEO 0.7 % SOLN, Place 1 drop into both eyes daily. , Disp: , Rfl:  .  umeclidinium-vilanterol (ANORO ELLIPTA) 62.5-25 MCG/INH AEPB, Inhale 1 puff into the lungs daily. (Patient not taking: Reported on 03/02/2019), Disp: 1 each, Rfl: 11 Allergies  Allergen Reactions  . Metolazone Other (See Comments)    Dizziness and falling  . Plavix [Clopidogrel Bisulfate] Other (See Comments)    High PRU's - non-responder  . Ibuprofen Other (See Comments)    Pt states she is not supposed to take ibuprofen because of other meds she is taking  . Nsaids Other (See Comments)    "I do not take NSAIDs d/t interfering with other meds"      Social History   Socioeconomic  History  . Marital status: Single    Spouse name: Not on file  . Number of children: 1  . Years of education: Not on file  . Highest education level: Not on file  Occupational History  . Not on file  Social Needs  . Financial resource strain: Somewhat hard  . Food insecurity    Worry: Sometimes true    Inability: Sometimes true  . Transportation needs    Medical: Yes    Non-medical: Yes  Tobacco Use  . Smoking status: Former Smoker    Packs/day: 0.50    Years: 20.00    Pack years: 10.00    Types: Cigarettes    Quit date: 04/30/2014    Years since quitting: 4.8  . Smokeless tobacco: Never Used  . Tobacco comment: smoked off and on x 20 years  Substance and Sexual Activity  . Alcohol use: No    Alcohol/week: 0.0 standard drinks  . Drug use: Not Currently    Types: Cocaine  . Sexual activity: Not Currently  Lifestyle  . Physical activity    Days per week: 0 days    Minutes per session: 0 min  . Stress: Rather much  Relationships  . Social Herbalist on phone: Twice a week    Gets together: Once a week    Attends religious service: 1 to 4 times per year    Active member of club or organization: No    Attends meetings of clubs or organizations: Never    Relationship status: Living with partner  . Intimate partner violence    Fear of current or ex partner: Patient refused    Emotionally abused: Patient refused    Physically abused: Patient refused    Forced sexual activity: Patient refused  Other Topics Concern  . Not on file  Social History Narrative   Origibnally from [place]   Most recently from [place]   Daughter lives in town      On Social security to CHF, COPD, CAD    Physical Exam Cardiovascular:     Rate and Rhythm: Normal rate and regular rhythm.     Pulses: Normal pulses.  Pulmonary:     Effort: Pulmonary effort is normal.     Breath sounds: Examination of the right-lower field reveals wheezing. Wheezing present.  Musculoskeletal:  Normal range of motion.  Skin:    General: Skin is warm and dry.     Capillary Refill: Capillary refill takes less than 2 seconds.  Neurological:     Mental Status: She is alert and oriented to person, place, and time.  Psychiatric:        Mood and Affect: Mood normal.         Future Appointments  Date Time Provider [place]  03/15/2019  2:00 PM [facility] ECHO OP 1 [facility]  03/15/2019  3:00 PM Larey Dresser, MD [facility] None  03/30/2019 10:30 AM Ladell Pier, MD [facility] None  04/03/2019  9:30 AM Elayne Snare, MD [facility] None    Today's Vitals   03/02/19 1208  BP: 112/62  Pulse: 82  Resp: 18  SpO2: 92%  Weight: 278 lb 9.6 oz (126.4 kg)   Body mass index is 42.99 kg/m.   Weight yesterday- did not weigh Last visit weight- 279 lb  Tamara Silva was seen at home today and reported feeling well. She denied chest pain, SOB, headache, dizziness, orthopnea, fever or cough since our last visit. She stated she has been compliant with her medications over the past week and her weight has been stable. Her medications were verified and she stated she would be able to order her own refills from the pharmacy. I informed her that I would be out of the office next week so if she needs anything she should contact the HF clinic and someone would be able to come see her. She was understanding and agreeable. I will follow up in two weeks.   Jacquiline Doe, EMT 03/02/19  ACTION: Home visit completed Next visit planned for 2 weeks

## 2019-03-15 ENCOUNTER — Ambulatory Visit (HOSPITAL_BASED_OUTPATIENT_CLINIC_OR_DEPARTMENT_OTHER)
Admission: RE | Admit: 2019-03-15 | Discharge: 2019-03-15 | Disposition: A | Discharge status: Home / Self Care | Payer: Medicaid Other | Source: Ambulatory Visit | Attending: Internal Medicine | Admitting: Internal Medicine

## 2019-03-15 ENCOUNTER — Other Ambulatory Visit: Payer: Self-pay

## 2019-03-15 ENCOUNTER — Other Ambulatory Visit (HOSPITAL_COMMUNITY): Payer: Medicaid Other

## 2019-03-15 ENCOUNTER — Ambulatory Visit (HOSPITAL_COMMUNITY)
Admission: RE | Admit: 2019-03-15 | Discharge: 2019-03-15 | Disposition: A | Discharge status: Home / Self Care | Payer: Medicaid Other | Source: Ambulatory Visit | Attending: Cardiology | Admitting: Cardiology

## 2019-03-15 VITALS — BP 111/58 | HR 65

## 2019-03-15 DIAGNOSIS — Z8674 Personal history of sudden cardiac arrest: Secondary | ICD-10-CM | POA: Insufficient documentation

## 2019-03-15 DIAGNOSIS — Z823 Family history of stroke: Secondary | ICD-10-CM | POA: Insufficient documentation

## 2019-03-15 DIAGNOSIS — Z7982 Long term (current) use of aspirin: Secondary | ICD-10-CM | POA: Diagnosis not present

## 2019-03-15 DIAGNOSIS — Z87891 Personal history of nicotine dependence: Secondary | ICD-10-CM | POA: Diagnosis not present

## 2019-03-15 DIAGNOSIS — I255 Ischemic cardiomyopathy: Secondary | ICD-10-CM | POA: Insufficient documentation

## 2019-03-15 DIAGNOSIS — Z825 Family history of asthma and other chronic lower respiratory diseases: Secondary | ICD-10-CM | POA: Diagnosis not present

## 2019-03-15 DIAGNOSIS — I5042 Chronic combined systolic (congestive) and diastolic (congestive) heart failure: Secondary | ICD-10-CM

## 2019-03-15 DIAGNOSIS — Z79899 Other long term (current) drug therapy: Secondary | ICD-10-CM | POA: Insufficient documentation

## 2019-03-15 DIAGNOSIS — J449 Chronic obstructive pulmonary disease, unspecified: Secondary | ICD-10-CM | POA: Diagnosis not present

## 2019-03-15 DIAGNOSIS — I130 Hypertensive heart and chronic kidney disease with heart failure and stage 1 through stage 4 chronic kidney disease, or unspecified chronic kidney disease: Secondary | ICD-10-CM | POA: Diagnosis not present

## 2019-03-15 DIAGNOSIS — Z9861 Coronary angioplasty status: Secondary | ICD-10-CM

## 2019-03-15 DIAGNOSIS — Z955 Presence of coronary angioplasty implant and graft: Secondary | ICD-10-CM | POA: Diagnosis not present

## 2019-03-15 DIAGNOSIS — E785 Hyperlipidemia, unspecified: Secondary | ICD-10-CM | POA: Diagnosis not present

## 2019-03-15 DIAGNOSIS — Z8249 Family history of ischemic heart disease and other diseases of the circulatory system: Secondary | ICD-10-CM | POA: Insufficient documentation

## 2019-03-15 DIAGNOSIS — Z794 Long term (current) use of insulin: Secondary | ICD-10-CM | POA: Diagnosis not present

## 2019-03-15 DIAGNOSIS — N179 Acute kidney failure, unspecified: Secondary | ICD-10-CM | POA: Diagnosis not present

## 2019-03-15 DIAGNOSIS — I252 Old myocardial infarction: Secondary | ICD-10-CM | POA: Diagnosis not present

## 2019-03-15 DIAGNOSIS — I5022 Chronic systolic (congestive) heart failure: Secondary | ICD-10-CM | POA: Diagnosis not present

## 2019-03-15 DIAGNOSIS — N1832 Chronic kidney disease, stage 3b: Secondary | ICD-10-CM | POA: Insufficient documentation

## 2019-03-15 DIAGNOSIS — G4733 Obstructive sleep apnea (adult) (pediatric): Secondary | ICD-10-CM | POA: Diagnosis not present

## 2019-03-15 DIAGNOSIS — I272 Pulmonary hypertension, unspecified: Secondary | ICD-10-CM | POA: Insufficient documentation

## 2019-03-15 DIAGNOSIS — E1122 Type 2 diabetes mellitus with diabetic chronic kidney disease: Secondary | ICD-10-CM | POA: Insufficient documentation

## 2019-03-15 DIAGNOSIS — E662 Morbid (severe) obesity with alveolar hypoventilation: Secondary | ICD-10-CM | POA: Diagnosis not present

## 2019-03-15 DIAGNOSIS — G4739 Other sleep apnea: Secondary | ICD-10-CM | POA: Diagnosis not present

## 2019-03-15 DIAGNOSIS — Z86711 Personal history of pulmonary embolism: Secondary | ICD-10-CM | POA: Insufficient documentation

## 2019-03-15 DIAGNOSIS — Z9981 Dependence on supplemental oxygen: Secondary | ICD-10-CM | POA: Diagnosis not present

## 2019-03-15 DIAGNOSIS — Z8673 Personal history of transient ischemic attack (TIA), and cerebral infarction without residual deficits: Secondary | ICD-10-CM | POA: Diagnosis not present

## 2019-03-15 DIAGNOSIS — I2510 Atherosclerotic heart disease of native coronary artery without angina pectoris: Secondary | ICD-10-CM | POA: Diagnosis not present

## 2019-03-15 LAB — BASIC METABOLIC PANEL
Anion gap: 12 (ref 5–15)
BUN: 36 mg/dL — ABNORMAL HIGH (ref 6–20)
CO2: 31 mmol/L (ref 22–32)
Calcium: 9.5 mg/dL (ref 8.9–10.3)
Chloride: 97 mmol/L — ABNORMAL LOW (ref 98–111)
Creatinine, Ser: 1.44 mg/dL — ABNORMAL HIGH (ref 0.44–1.00)
GFR calc Af Amer: 46 mL/min — ABNORMAL LOW (ref >60)
GFR calc non Af Amer: 40 mL/min — ABNORMAL LOW (ref >60)
Glucose, Bld: 151 mg/dL — ABNORMAL HIGH (ref 70–99)
Potassium: 3.6 mmol/L (ref 3.5–5.1)
Sodium: 140 mmol/L (ref 135–145)

## 2019-03-15 MED ORDER — BIDIL 20-37.5 MG PO TABS: 1.0000 | ORAL_TABLET | Freq: Three times a day (TID) | ORAL | 6 refills | Status: DC

## 2019-03-15 NOTE — Patient Instructions (Signed)
INCREASE Bidil to 1 tab three times a day  Labs today We will only contact you if something comes back abnormal or we need to make some changes. Otherwise no news is good news!  You have been referred to Dr Radford Pax for assistance with bipap mask.   Your physician recommends that you schedule a follow-up appointment in: 2 months with Dr Aundra Dubin  At the [facility], you and your health needs are our priority. As part of our continuing mission to provide you with exceptional heart care, we have created designated Provider Care Teams. These Care Teams include your primary Cardiologist (physician) and Advanced Practice Providers (APPs- Physician Assistants and Nurse Practitioners) who all work together to provide you with the care you need, when you need it.   You may see any of the following providers on your designated Care Team at your next follow up: [name] Kitchen Dr Glori Bickers . Dr Loralie Champagne . Darrick Grinder, NP . Lyda Jester, PA   Please be sure to bring in all your medications bottles to every appointment.

## 2019-03-15 NOTE — Progress Notes (Signed)
PCP: Ladell Pier, MD HF Cardiology: Dr Aundra Dubin   HPI: Tamara Silva is a 58 y.o. with a hx of chronic systolic CHF, HTN, DM-2, HLD, CVA 1/20 and CAD (PCI in 2006 and 2009 - not sure which vessel) and then in 2015 STEMI with Xience DES to distal RCA.  She has had frequent admits for anemia and CHF with transfusions and diuresis. Prasugrel was stopped. No source of bleeding found on EGD and colonoscopy.   She is on home 02 and nocturnal BiPAP for OHS/OSA and COPD.   She no longer smokes.  Hx of PE in 2014.    Hx of bradycardia with Coreg at higher dose.  Echo 05/31/18 with EF 40-45%, PA peak pressure 71 mmHg.    Admitted 10/25/18 with SOB and edema, + productive cough.  This started over a few weeks prior to admission. Hospital course complicated by PEA arrest on 10/28/18. CPR for 25 minutes. Extubated 10/30/18.  Developed AKI.  HF team followed closely to optimize HF.  She was massively volume overloaded and extensively diuresed, weight down 65 lbs total.  Echo showed EF down to 30-35%.  No coronary angiography due to AKI.   Readmitted  11/17/18 with lower extremity edema and volume overload. Diuresed with IV lasix and transitioned to torsemide 40 mg twice daily. Discharged on 11/21/18 to home on oxygen.   She had RHC/LHC in 9/20, most significant stenosis was 75% ostial D1.  There was mild to moderate nonobstructive disease in other vessels. There was not an interventional target.  Filling pressures were mildly elevated with preserved cardiac output.  Torsemide was increased.   Echo was done today and reviewed.  EF remained 30-35% with wall motion abnormalities, RV appeared normal.    She returns for followup of CHF.  She continues to use home oxygen.  She is supposed to be using Bipap at night but has not been compliant with it.  She does not like the the mask.  Her breathing is better with increased torsemide, now short of breath after walking about 100 feet. No lightheadedness.  No chest pain.   Weight is down about 6 lbs.  No orthopnea/PND.    Labs (7/20): K 3.7, creatinine 1.56 Labs (9/20): K 3.7, creatinine 1.46, LDL 52, HDL 56  PMH: 1. CVA (1/20). 2. Type 2 diabetes 3. HTN 4. H/o seizures 5. PEA arrest (5/20) with CPR.  6. COPD: Home oxygen.  Stopped smoking in 1/15.  7. CAD: PCI [facility] 2006, [facility] 2009.  - Inferior STEMI 5/15, DES to RCA.  - Poor responder to Plavix.  - LHC (9/20): 75% ostial D1, 40-50% mLCx, 40% proximal PLV.  8. Hyperlipidemia 9. PE: 2014, LLL.  10. OHS/OSA: Home oxygen and Bipap used.  11. Chronic systolic CHF: Suspect primarily ischemic cardiomyopathy.  - Echo (5/15): EF 35-40% - Echo (2/16): EF 40-45% - Echo (1/20): EF 40-45% - Echo (5/20): EF 30-35%, mild RV dilation.  - RHC (5/20): mean RA 9, PA 62/22 mean 37, mean PCWP 15, CI 4.29, PVR 2.1 WU.  - RHC (9/20): mean RA 11, PA 66/18 mean 37, mean PCWP 19, CI 3.12, PVR 2.4 WU - Echo (10/20): EF 30-35% with wall motion abnormalities, normal RV.  12. CKD: Stage 3.  13. Fe deficiency anemia: EGD and colonoscopy without definite source of bleeding.   ROS: All systems negative except as listed in HPI, PMH and Problem List.  SH:  Social History   Socioeconomic History   Marital status:  Single    Spouse name: Not on file   Number of children: 1   Years of education: Not on file   Highest education level: Not on file  Occupational History   Not on file  Social Needs   Financial resource strain: Somewhat hard   Food insecurity    Worry: Sometimes true    Inability: Sometimes true   Transportation needs    Medical: Yes    Non-medical: Yes  Tobacco Use   Smoking status: Former Smoker    Packs/day: 0.50    Years: 20.00    Pack years: 10.00    Types: Cigarettes    Quit date: 04/30/2014    Years since quitting: 4.8   Smokeless tobacco: Never Used   Tobacco comment: smoked off and on x 20 years  Substance and Sexual Activity   Alcohol use: No    Alcohol/week: 0.0  standard drinks   Drug use: Not Currently    Types: Cocaine   Sexual activity: Not Currently  Lifestyle   Physical activity    Days per week: 0 days    Minutes per session: 0 min   Stress: Rather much  Relationships   Social connections    Talks on phone: Twice a week    Gets together: Once a week    Attends religious service: 1 to 4 times per year    Active member of club or organization: No    Attends meetings of clubs or organizations: Never    Relationship status: Living with partner   Intimate partner violence    Fear of current or ex partner: Patient refused    Emotionally abused: Patient refused    Physically abused: Patient refused    Forced sexual activity: Patient refused  Other Topics Concern   Not on file  Social History Narrative   Origibnally from [place]   Most recently from [place]   Daughter lives in town      On Social security to CHF, COPD, CAD    FH:  Family History  Problem Relation Age of Onset   Stroke Mother    Hypertension Mother    Colon cancer Father    Diabetes Father    Heart attack Father    Sarcoidosis Other    Breast cancer Paternal Aunt    Emphysema Brother    Lung disease Brother        Unknown type, 3 brothers, one with liver and lung disease   Asthma Paternal Aunt     Current Outpatient Medications  Medication Sig Dispense Refill   ACCU-CHEK AVIVA PLUS test strip USE 1 STRIP TO CHECK BLOOD GLUCOSE 3 TIMES A DAY 100 each 2   acetaminophen (TYLENOL) 500 MG tablet Take 500 mg by mouth every 6 (six) hours as needed for moderate pain or headache.     albuterol (PROAIR HFA) 108 (90 Base) MCG/ACT inhaler INHALE 2 PUFFS BY MOUTH 4 TIMES DAILY AS NEEDED (Patient taking differently: Inhale 2 puffs into the lungs every 6 (six) hours as needed for wheezing or shortness of breath. ) 18 g 11   aspirin EC 81 MG tablet Take 1 tablet (81 mg total) by mouth daily. 90 tablet 3   bisoprolol (ZEBETA) 5 MG tablet Take 1  tablet (5 mg total) by mouth daily. 30 tablet 6   ferrous gluconate (FERGON) 324 MG tablet Take 1 tablet (324 mg total) by mouth daily with breakfast. Office visit needed prior to future refills 30 tablet 0  ferrous sulfate 325 (65 FE) MG tablet Take 325 mg by mouth daily with breakfast.     hydrocerin (EUCERIN) CREA Apply 1 application topically 2 (two) times daily. (Patient not taking: Reported on 03/02/2019)  0   insulin aspart (NOVOLOG FLEXPEN) 100 UNIT/ML FlexPen Inject 10 units three times daily before each meal 15 mL 0   insulin detemir (LEVEMIR) 100 UNIT/ML injection Inject 20 units [place] BID. (Patient taking differently: Inject 20 Units into the skin 2 (two) times daily. ) 10 mL 2   ipratropium-albuterol (DUONEB) 0.5-2.5 (3) MG/3ML SOLN Use nebulizer to inhale contents of one vial every 6 hours as needed for shortness of breath, wheezing (Patient taking differently: Inhale 3 mLs into the lungs every 6 (six) hours as needed (shortness of breath). ) 360 mL 11   isosorbide-hydrALAZINE (BIDIL) 20-37.5 MG tablet Take 1 tablet by mouth 3 (three) times daily. 90 tablet 6   liraglutide (VICTOZA) 18 MG/3ML SOPN 0.6 ml subcut x 1 wk then increase to 1.2 ml daily (Patient taking differently: Inject 0.6-1.8 mg into the skin See admin instructions. Inject 0.6 mg daily for 1 week, then 1.2 mg daily 1 week, then 1.8 mg daily) 3 mL 3   NEEDLE, DISP, 26 G 26G X 1/2" MISC 1 application by Does not apply route daily. 100 each 0   omeprazole (PRILOSEC) 40 MG capsule Take 1 capsule (40 mg total) by mouth daily. 90 capsule 3   PAZEO 0.7 % SOLN Place 1 drop into both eyes daily.      potassium chloride SA (K-DUR) 20 MEQ tablet Take 1 tablet (20 mEq total) by mouth daily. 30 tablet 3   RESTASIS 0.05 % ophthalmic emulsion Place 1 drop into both eyes 2 (two) times daily.      rosuvastatin (CRESTOR) 40 MG tablet TAKE 1 TABLET (40 MG TOTAL) BY MOUTH DAILY AT 6 PM. 30 tablet 3   spironolactone (ALDACTONE) 25  MG tablet Take 1 tablet (25 mg total) by mouth daily. 90 tablet 1   torsemide (DEMADEX) 20 MG tablet Take 3 tablets (60 mg total) by mouth 2 (two) times daily. 150 tablet 3   umeclidinium-vilanterol (ANORO ELLIPTA) 62.5-25 MCG/INH AEPB Inhale 1 puff into the lungs daily. (Patient not taking: Reported on 03/02/2019) 1 each 11   vitamin B-12 (CYANOCOBALAMIN) 1000 MCG tablet Take 1,000 mcg by mouth daily.     No current facility-administered medications for this encounter.     Vitals:   03/15/19 1656  BP: (!) 111/58  Pulse: 65  SpO2: 100%   Wt Readings from Last 3 Encounters:  03/02/19 126.4 kg (278 lb 9.6 oz)  02/27/19 126.6 kg (279 lb)  02/23/19 126.9 kg (279 lb 12.8 oz)    PHYSICAL EXAM: General: NAD Neck: No JVD, no thyromegaly or thyroid nodule.  Lungs: Clear to auscultation bilaterally with normal respiratory effort. CV: Nondisplaced PMI.  Heart regular S1/S2, no S3/S4, no murmur.  No peripheral edema.  No carotid bruit.  Normal pedal pulses.  Abdomen: Soft, nontender, no hepatosplenomegaly, no distention.  Skin: Intact without lesions or rashes.  Neurologic: Alert and oriented x 3.  Psych: Normal affect. Extremities: No clubbing or cyanosis.  HEENT: Normal.   ASSESSMENT & PLAN: 1. H/O PEA arrest: Due to severe hypoxemia in 5/20 in setting of CHF exacerbation.   2. Chronic systolic CHF: Suspect ischemic cardiomyopathy. Echo in 1/20 with EF 40-45%, RV moderately dilated, PASP 71 mmHg. Echo in 5/20 with lower EF, 30-35%, and mildly dilated RV.  Suspect significant RV failure, may be related to OHS/OSA and COPD. She was massively volume overloaded during 5/20 admission and diuresed extensively.  RHC done 11/06/18 showed high cardiac output and minimally elevated filling pressures after diuresis.  PVR was not elevated, appeared to be high output PH.  RHC was done again in 9/20, filling pressures were elevated with preserved cardiac output.  Echo today showed EF still 30-35%.  Since  increasing torsemide after 9/20 RHC, weight is down and she looks near-euvolemic.  NYHA class III symptoms, likely mainly due to OHS/OSA and COPD.  - Continue torsemide 60 mg bid. BMET today.  - Increase Bidil to 1 tab tid.   - Continue spironolactone 25 mg daily.  - No ACEI/ARB/ARNI yet with recent AKI and CKD, but will check BMET today and may be able to start at next appt.  - Given significant lung disease/COPD, I have her on bisoprolol 5 mg daily (more beta-1 selective).  - EF remains < 35% with ischemic cardiomyopathy.  We discussed ICD placement and she is willing to talk with EP.  She would not be a CRT candidate.  I will refer to EP.  - She would likely be a good Cardiomems candidate, will need to discuss this in the future.  Will address ICD first. 3. CKD: Stage III. Last creatinine down to 1.46.  - BMET today.  4. COPD: On home oxygen at baseline. Has quit smoking.  5. OHS/OSA: On home oxygen at all times and Bipap at night.  - Having trouble with Bipap, encouraged her to followup with sleep medicine => will try to get her an appt with Dr. Radford Pax.  6. CAD: History of PCI, most recently had inferior STEMI in 5/15 with DES to RCA. With fall in EF this year, coronary angiography was done in 9/20.  There was 75% ostial D1 stenosis but no good target for PCI.  - Continue ASA 81 and Crestor 40 daily.  Good lipids in 9/20.    7. Remote PE: She has not been anticoagulated.  8. Anemia: History of GI bleeding, had endoscopies earlier this year with no bleeding source found. Also with history of B12 deficiency.  9. Pulmonary hypertension: Pulmonary venous hypertension on last RHC in 9/20.   Followup in 2 months.   Tamara Silva 03/15/2019

## 2019-03-15 NOTE — Progress Notes (Signed)
  Echocardiogram 2D Echocardiogram has been performed.  Jannett Celestine 03/15/2019, 5:07 PM

## 2019-03-16 ENCOUNTER — Other Ambulatory Visit (HOSPITAL_COMMUNITY): Payer: Self-pay

## 2019-03-16 DIAGNOSIS — I5042 Chronic combined systolic (congestive) and diastolic (congestive) heart failure: Secondary | ICD-10-CM

## 2019-03-16 NOTE — Progress Notes (Signed)
Referral for EP sent per MD as EF 30-35%

## 2019-03-16 NOTE — Progress Notes (Signed)
Paramedicine Encounter    Patient ID: Tamara Silva, female    DOB: May 09, 1961, 58 y.o.   MRN: [id number]   Patient Care Team: Ladell Pier, MD as PCP - General (Internal Medicine) Lorretta Harp, MD as PCP - Cardiology (Cardiology) Larey Dresser, MD as PCP - Advanced Heart Failure (Cardiology)  Patient Active Problem List   Diagnosis Date Noted  . Diabetic peripheral neuropathy ([place]) 01/19/2019  . Former smoker 01/19/2019  . CKD (chronic kidney disease) stage 3, GFR 30-59 ml/min 01/19/2019  . Cardiac arrest ([place]) 10/28/2018  . Seizure ([place]) 10/28/2018  . Noncompliance with treatment plan 07/24/2018  . General patient noncompliance 07/24/2018  . Noncompliance with diet and medication regimen 07/24/2018  . Dysarthria   . BiPAP (biphasic positive airway pressure) dependence   . Pancytopenia ([place]) 07/08/2018  . Type II diabetes mellitus with renal manifestations ([place]) 07/08/2018  . Hepatic steatosis 06/22/2018  . Chronic respiratory failure with hypoxia and hypercapnia (HCC)   . On home O2   . Slow transit constipation   . Cocaine abuse ([place])   . Diabetes mellitus type 2 in obese ([place])   . Generalized tonic-clonic seizure ([place]) 06/05/2018  . Cerebral embolism with cerebral infarction 06/01/2018  . Pressure injury of skin 05/30/2018  . Iron deficiency anemia 01/27/2018  . Tobacco abuse disorder 10/03/2017  . Dyslipidemia 04/29/2016  . Primary insomnia 04/29/2016  . Obesity hypoventilation syndrome ([place])   . OSA (obstructive sleep apnea)   . Dyslipidemia associated with type 2 diabetes mellitus ([place]) 05/21/2015  . COPD GOLD III  06/10/2014  . CAD S/P percutaneous coronary angioplasty - prior PCI to LAD; RCA PCI: new Xience [place]pine DES 2.75 mm x 15 mm  10/07/2013  . Morbid obesity ([place]) 07/30/2013  . Chronic combined systolic and diastolic heart failure ([place]) 07/10/2013  . Chronic pulmonary embolism ([place]) 02/08/2013  . Bipolar disorder ([place]) 02/08/2013  . Essential  hypertension 02/08/2013  . COPD (chronic obstructive pulmonary disease) ([place]) 10/15/2012  . Pulmonary embolism on right ([place]) 10/15/2012    Current Outpatient Medications:  .  ACCU-CHEK AVIVA PLUS test strip, USE 1 STRIP TO CHECK BLOOD GLUCOSE 3 TIMES A DAY, Disp: 100 each, Rfl: 2 .  acetaminophen (TYLENOL) 500 MG tablet, Take 500 mg by mouth every 6 (six) hours as needed for moderate pain or headache., Disp: , Rfl:  .  albuterol (PROAIR HFA) 108 (90 Base) MCG/ACT inhaler, INHALE 2 PUFFS BY MOUTH 4 TIMES DAILY AS NEEDED (Patient taking differently: Inhale 2 puffs into the lungs every 6 (six) hours as needed for wheezing or shortness of breath. ), Disp: 18 g, Rfl: 11 .  aspirin EC 81 MG tablet, Take 1 tablet (81 mg total) by mouth daily., Disp: 90 tablet, Rfl: 3 .  bisoprolol (ZEBETA) 5 MG tablet, Take 1 tablet (5 mg total) by mouth daily., Disp: 30 tablet, Rfl: 6 .  ferrous sulfate 325 (65 FE) MG tablet, Take 325 mg by mouth daily with breakfast., Disp: , Rfl:  .  insulin aspart (NOVOLOG FLEXPEN) 100 UNIT/ML FlexPen, Inject 10 units three times daily before each meal, Disp: 15 mL, Rfl: 0 .  insulin detemir (LEVEMIR) 100 UNIT/ML injection, Inject 20 units [place] BID. (Patient taking differently: Inject 20 Units into the skin 2 (two) times daily. ), Disp: 10 mL, Rfl: 2 .  ipratropium-albuterol (DUONEB) 0.5-2.5 (3) MG/3ML SOLN, Use nebulizer to inhale contents of one vial every 6 hours as needed for shortness of breath, wheezing (Patient taking differently:  Inhale 3 mLs into the lungs every 6 (six) hours as needed (shortness of breath). ), Disp: 360 mL, Rfl: 11 .  isosorbide-hydrALAZINE (BIDIL) 20-37.5 MG tablet, Take 1 tablet by mouth 3 (three) times daily., Disp: 90 tablet, Rfl: 6 .  liraglutide (VICTOZA) 18 MG/3ML SOPN, 0.6 ml subcut x 1 wk then increase to 1.2 ml daily (Patient taking differently: Inject 0.6-1.8 mg into the skin See admin instructions. Inject 0.6 mg daily for 1 week, then 1.2 mg daily 1  week, then 1.8 mg daily), Disp: 3 mL, Rfl: 3 .  NEEDLE, DISP, 26 G 26G X 1/2" MISC, 1 application by Does not apply route daily., Disp: 100 each, Rfl: 0 .  omeprazole (PRILOSEC) 40 MG capsule, Take 1 capsule (40 mg total) by mouth daily., Disp: 90 capsule, Rfl: 3 .  PAZEO 0.7 % SOLN, Place 1 drop into both eyes daily. , Disp: , Rfl:  .  potassium chloride SA (K-DUR) 20 MEQ tablet, Take 1 tablet (20 mEq total) by mouth daily., Disp: 30 tablet, Rfl: 3 .  RESTASIS 0.05 % ophthalmic emulsion, Place 1 drop into both eyes 2 (two) times daily. , Disp: , Rfl:  .  rosuvastatin (CRESTOR) 40 MG tablet, TAKE 1 TABLET (40 MG TOTAL) BY MOUTH DAILY AT 6 PM., Disp: 30 tablet, Rfl: 3 .  spironolactone (ALDACTONE) 25 MG tablet, Take 1 tablet (25 mg total) by mouth daily., Disp: 90 tablet, Rfl: 1 .  torsemide (DEMADEX) 20 MG tablet, Take 3 tablets (60 mg total) by mouth 2 (two) times daily., Disp: 150 tablet, Rfl: 3 .  vitamin B-12 (CYANOCOBALAMIN) 1000 MCG tablet, Take 1,000 mcg by mouth daily., Disp: , Rfl:  .  ferrous gluconate (FERGON) 324 MG tablet, Take 1 tablet (324 mg total) by mouth daily with breakfast. Office visit needed prior to future refills (Patient not taking: Reported on 03/16/2019), Disp: 30 tablet, Rfl: 0 .  hydrocerin (EUCERIN) CREA, Apply 1 application topically 2 (two) times daily. (Patient not taking: Reported on 03/02/2019), Disp: , Rfl: 0 .  umeclidinium-vilanterol (ANORO ELLIPTA) 62.5-25 MCG/INH AEPB, Inhale 1 puff into the lungs daily. (Patient not taking: Reported on 03/02/2019), Disp: 1 each, Rfl: 11 Allergies  Allergen Reactions  . Metolazone Other (See Comments)    Dizziness and falling  . Plavix [Clopidogrel Bisulfate] Other (See Comments)    High PRU's - non-responder  . Ibuprofen Other (See Comments)    Pt states she is not supposed to take ibuprofen because of other meds she is taking  . Nsaids Other (See Comments)    "I do not take NSAIDs d/t interfering with other meds"       Social History   Socioeconomic History  . Marital status: Single    Spouse name: Not on file  . Number of children: 1  . Years of education: Not on file  . Highest education level: Not on file  Occupational History  . Not on file  Social Needs  . Financial resource strain: Somewhat hard  . Food insecurity    Worry: Sometimes true    Inability: Sometimes true  . Transportation needs    Medical: Yes    Non-medical: Yes  Tobacco Use  . Smoking status: Former Smoker    Packs/day: 0.50    Years: 20.00    Pack years: 10.00    Types: Cigarettes    Quit date: 04/30/2014    Years since quitting: 4.8  . Smokeless tobacco: Never Used  . Tobacco comment: smoked  off and on x 20 years  Substance and Sexual Activity  . Alcohol use: No    Alcohol/week: 0.0 standard drinks  . Drug use: Not Currently    Types: Cocaine  . Sexual activity: Not Currently  Lifestyle  . Physical activity    Days per week: 0 days    Minutes per session: 0 min  . Stress: Rather much  Relationships  . Social Herbalist on phone: Twice a week    Gets together: Once a week    Attends religious service: 1 to 4 times per year    Active member of club or organization: No    Attends meetings of clubs or organizations: Never    Relationship status: Living with partner  . Intimate partner violence    Fear of current or ex partner: Patient refused    Emotionally abused: Patient refused    Physically abused: Patient refused    Forced sexual activity: Patient refused  Other Topics Concern  . Not on file  Social History Narrative   Origibnally from [place]   Most recently from [place]   Daughter lives in town      On Social security to CHF, COPD, CAD    Physical Exam Cardiovascular:     Rate and Rhythm: Normal rate and regular rhythm.     Pulses: Normal pulses.  Pulmonary:     Effort: Pulmonary effort is normal.     Breath sounds: Normal breath sounds.  Abdominal:     General:  There is no distension.  Musculoskeletal: Normal range of motion.     Right lower leg: No edema.     Left lower leg: No edema.  Skin:    General: Skin is warm and dry.     Capillary Refill: Capillary refill takes less than 2 seconds.  Neurological:     Mental Status: She is alert and oriented to person, place, and time.  Psychiatric:        Mood and Affect: Mood normal.         Future Appointments  Date Time Provider [place]  03/30/2019 10:30 AM Ladell Pier, MD [facility] None  04/03/2019  9:30 AM Elayne Snare, MD [facility] None  05/28/2019 11:40 AM Larey Dresser, MD [facility] None    BP (!) 110/54 (BP Location: Left Arm, Patient Position: Sitting, Cuff Size: Large)   Pulse 80   Resp 16   Wt 279 lb 12.8 oz (126.9 kg)   LMP 07/28/2013 Comment: perimenopausal  SpO2 98%   BMI 43.18 kg/m   Weight yesterday- did not weigh Last visit weight- 278 lb  Tamara Silva was seen at home today and reported feeling well. She denied chest pain, SOB, headache, dizziness, orthopnea, fever or cough since our last visit. She stated she has been compliant with her medications and her weight has been stable. Her medications were verified but she does not use a pillbox. I spent most of our visit talking about the purpose of a defibrillator and the procedure by which they implant the device. I advised that the EP will be better able to answer all her questions when she goes for a consultation. She was understanding and agreeable. I will follow up next week.   Jacquiline Doe, EMT 03/16/19  ACTION: Home visit completed Next visit planned for 1 week

## 2019-03-19 ENCOUNTER — Other Ambulatory Visit: Payer: Self-pay

## 2019-03-19 ENCOUNTER — Ambulatory Visit (INDEPENDENT_AMBULATORY_CARE_PROVIDER_SITE_OTHER): Payer: Medicaid Other | Admitting: Cardiology

## 2019-03-19 ENCOUNTER — Encounter: Payer: Self-pay | Admitting: Cardiology

## 2019-03-19 VITALS — BP 104/62 | HR 71 | Ht 67.5 in | Wt 279.8 lb

## 2019-03-19 DIAGNOSIS — I255 Ischemic cardiomyopathy: Secondary | ICD-10-CM

## 2019-03-19 DIAGNOSIS — Z01812 Encounter for preprocedural laboratory examination: Secondary | ICD-10-CM

## 2019-03-19 NOTE — Patient Instructions (Addendum)
Medication Instructions:  Your physician recommends that you continue on your current medications as directed. Please refer to the Current Medication list given to you today.     * If you need a refill on your cardiac medications before your next appointment, please call your pharmacy. *   Labwork: Pre procedure labs today: BMET & CBC  Testing/Procedures: Your physician has recommended that you have a defibrillator inserted. An implantable cardioverter defibrillator (ICD) is a small device that is placed in your chest or, in rare cases, your abdomen. This device uses electrical pulses or shocks to help control life-threatening, irregular heartbeats that could lead the heart to suddenly stop beating (sudden cardiac arrest). Leads are attached to the ICD that goes into your heart. This is done in the hospital and usually requires an overnight stay.   Please review the instructions below   Follow-Up: Your physician recommends that you schedule a follow-up appointment in: 10-14 days, after your procedure on 04/04/19, with device clinic for a wound check.  Your physician recommends that you schedule a follow-up appointment in: 91 days, after your procedure on 04/04/19, with Dr. Curt Bears.  Thank you for choosing [facility]!!   Trinidad Curet, RN [phone]   Any Other Special Instructions Will Be Listed Below (If Applicable).  Implantable Device Instructions  You are scheduled for: Defibrillator implant on 04/04/2019 with Dr. Curt Bears.  1.   Pre procedure testing-             A.  LAB WORK--- On 03/19/19.  You do not need to be fasting.                B. COVID TEST-- On 03/31/19 @ 11:00 am - You will go to [facility] hospital ([place]) for your Covid testing.   This is a drive thru test site.  There will be multiple testing areas.  Be sure to share with the first checkpoint that you are there for pre-procedure/surgery testing. This will put you into the right  (yellow) lane that leads to the PAT testing team.   Stay in your car and the nurse team will come to your car to test you.  After you are tested please go home and self quarantine until the day of your procedure.    2. On the day of your procedure 04/04/19 you will go to [facility] hospital ([address]) at 10:30 am.  Dennis Bast will go to the main entrance A [employer]) and enter where the [employer] are.  You will check in at ADMITTING.  You may have one support person come in to the hospital with you.  They will be asked to wait in the waiting room.   3.   Do not eat or drink after midnight prior to your procedure.   4.   The night before your procedure take 1/2 your usual dose of bedtime insulin,  you will take 10 units of your bedtime insulin Levemir      On the morning of your procedure do NOT take any medication.  5.  The night before your procedure and the morning of your procedure scrub your neck/chest with surgical scrub.  An instruction letter is included with this letter.     5.  Plan for an overnight stay.  If you use your phone frequently bring your phone charger.  When you are discharged you will need someone to drive you home.   6.  You will  follow up with the [place] clinic 10-14 days after your procedure. You will follow up with Dr. Curt Bears 91 days after your procedure.  These appointments will be made for you.   * If you have ANY questions after you get home, please call the office [phone] and ask for Bernice Mullin RN or send a MyChart message.      Cardioverter Defibrillator Implantation An implantable cardioverter defibrillator (ICD) is a small, lightweight, battery-powered device that is placed (implanted) under the skin in the chest or abdomen. Your caregiver may prescribe an ICD if:  You have had an irregular heart rhythm (arrhythmia) that originated in the lower chambers of the heart (ventricles).  Your heart has been damaged by a disease (such  as coronary artery disease) or heart condition (such as a heart attack). An ICD consists of a battery that lasts several years, a small computer called a pulse generator, and wires called leads that go into the heart. It is used to detect and correct two dangerous arrhythmias: a rapid heart rhythm (tachycardia) and an arrhythmia in which the ventricles contract in an uncoordinated way (fibrillation). When an ICD detects tachycardia, it sends an electrical signal to the heart that restores the heartbeat to normal (cardioversion). This signal is usually painless. If cardioversion does not work or if the ICD detects fibrillation, it delivers a small electrical shock to the heart (defibrillation) to restart the heart. The shock may feel like a strong jolt in the chest. ICDs may be programmed to correct other problems. Sometimes, ICDs are programmed to act as another type of implantable device called a pacemaker. Pacemakers are used to treat a slow heartbeat (bradycardia). LET YOUR CAREGIVER KNOW ABOUT:  Any allergies you have.  All medicines you are taking, including vitamins, herbs, eyedrops, and over-the-counter medicines and creams.  Previous problems you or members of your family have had with the use of anesthetics.  Any blood disorders you have had.  Other health problems you have. RISKS AND COMPLICATIONS Generally, the procedure to implant an ICD is safe. However, as with any surgical procedure, complications can occur. Possible complications associated with implanting an ICD include:  Swelling, bleeding, or bruising at the site where the ICD was implanted.  Infection at the site where the ICD was implanted.  A reaction to medicine used during the procedure.  Nerve, heart, or blood vessel damage.  Blood clots. BEFORE THE PROCEDURE  You may need to have blood tests, heart tests, or a chest X-ray done before the day of the procedure.  Ask your caregiver about changing or stopping your  regular medicines.  Make plans to have someone drive you home. You may need to stay in the hospital overnight after the procedure.  Stop smoking at least 24 hours before the procedure.  Take a bath or shower the night before the procedure. You may need to scrub your chest or abdomen with a special type of soap.  Do not eat or drink before your procedure for as long as directed by your caregiver. Ask if it is okay to take any needed medicine with a small sip of water. PROCEDURE  The procedure to implant an ICD in your chest or abdomen is usually done at a hospital in a room that has a large X-ray machine called a fluoroscope. The machine will be above you during the procedure. It will help your caregiver see your heart during the procedure. Implanting an ICD usually takes 1-3 hours. Before the procedure:  Small monitors will be put on your body. They will be used to check your heart, blood pressure, and oxygen level.  A needle will be put into a vein in your hand or arm. This is called an intravenous (IV) access tube. Fluids and medicine will flow directly into your body through the IV tube.  Your chest or abdomen will be cleaned with a germ-killing (antiseptic) solution. The area may be shaved.  You may be given medicine to help you relax (sedative).  You will be given a medicine called a local anesthetic. This medicine will make the surgical site numb while the ICD is implanted. You will be sleepy but awake during the procedure. After you are numb the procedure will begin. The caregiver will:  Make a small cut (incision). This will make a pocket deep under your skin that will hold the pulse generator.  Guide the leads through a large blood vessel into your heart and attach them to the heart muscles. Depending on the ICD, the leads may go into one ventricle or they may go to both ventricles and into an upper chamber of the heart (atrium).  Test the ICD.  Close the incision with  stitches, glue, or staples. AFTER THE PROCEDURE  You may feel pain. Some pain is normal. It may last a few days.  You may stay in a recovery area until the local anesthetic has worn off. Your blood pressure and pulse will be checked often. You will be taken to a room where your heart will be monitored.  A chest X-ray will be taken. This is done to check that the cardioverter defibrillator is in the right place.  You may stay in the hospital overnight.  A slight bump may be seen over the skin where the ICD was placed. Sometimes, it is possible to feel the ICD under the skin. This is normal.  In the months and years afterward, your caregiver will check the device, the leads, and the battery every few months. Eventually, when the battery is low, the ICD will be replaced.   This information is not intended to replace advice given to you by your health care provider. Make sure you discuss any questions you have with your health care provider.   Document Released: 02/06/2002 Document Revised: 03/07/2013 Document Reviewed: 06/05/2012 Elsevier Interactive Patient Education 2016 [place] Defibrillator Implantation, Care After This sheet gives you information about how to care for yourself after your procedure. Your health care provider may also give you more specific instructions. If you have problems or questions, contact your health care provider. What can I expect after the procedure? After the procedure, it is common to have:  Some pain. It may last a few days.  A slight bump over the skin where the device was placed. Sometimes, it is possible to feel the device under the skin. This is normal.  During the months and years after your procedure, your health care provider will check the device, the leads, and the battery every few months. Eventually, when the battery is low, the device will be replaced. Follow these instructions at home: Medicines  Take  over-the-counter and prescription medicines only as told by your health care provider.  If you were prescribed an antibiotic medicine, take it as told by your health care provider. Do not stop taking the antibiotic even if you start to feel better. Incision care   Follow instructions from your health care provider about how to take  care of your incision area. Make sure you: ? Wash your hands with soap and water before you change your bandage (dressing). If soap and water are not available, use hand sanitizer. ? Change your dressing as told by your health care provider. ? Leave stitches (sutures), skin glue, or adhesive strips in place. These skin closures may need to stay in place for 2 weeks or longer. If adhesive strip edges start to loosen and curl up, you may trim the loose edges. Do not remove adhesive strips completely unless your health care provider tells you to do that.  Check your incision area every day for signs of infection. Check for: ? More redness, swelling, or pain. ? More fluid or blood. ? Warmth. ? Pus or a bad smell.  Do not use lotions or ointments near the incision area unless told by your health care provider.  Keep the incision area clean and dry for 2-3 days after the procedure or for as long as told by your health care provider. It takes several weeks for the incision site to heal completely.  Do not take baths, swim, or use a hot tub until your health care provider approves. Activity  Try to walk a little every day. Exercising is important after this procedure. Also, use your shoulder on the side of the defibrillator in daily tasks that do not require a lot of motion.  For at least 6 weeks: ? Do not lift your upper arm above your shoulders. This means no tennis, golf, or swimming for this period of time. If you tend to sleep with your arm above your head, use a restraint to prevent this during sleep. ? Avoid sudden jerking, pulling, or chopping movements that  pull your upper arm far away from your body.  Ask your health care provider when you may go back to work.  Check with your health care provider before you start to drive or play sports. Electric and magnetic fields  Tell all health care providers that you have a defibrillator. This may prevent them from giving you an MRI scan because strong magnets are used for that test.  If you must pass through a metal detector, quickly walk through it. Do not stop under the detector, and do not stand near it.  Avoid places or objects that have a strong electric or magnetic field, including: ? [employer]. At the airport, let officials know that you have a defibrillator. Your defibrillator ID card will let you be checked in a way that is safe for you and will not damage your defibrillator. Also, do not let a security person wave a magnetic wand near your defibrillator. That can make it stop working. ? Power plants. ? Large electrical generators. ? Anti-theft systems or electronic article surveillance (EAS). ? Radiofrequency transmission towers, such as cell phone and radio towers.  Do not use amateur (ham) radio equipment or electric (arc) welding torches. Some devices are safe to use if held at least 12 inches (30 cm) from your defibrillator. These include power tools, lawn mowers, and speakers. If you are unsure if something is safe to use, ask your health care provider.  Do not use MP3 player headphones. They have magnets.  You may safely use electric blankets, heating pads, computers, and microwave ovens.  When using your cell phone, hold it to the ear that is on the opposite side from the defibrillator. Do not leave your cell phone in a pocket over the defibrillator. General instructions  Follow diet instructions from your health care provider, if this applies.  Always keep your defibrillator ID card with you. The card should list the implant date, device model, and manufacturer.  Consider wearing a medical alert bracelet or necklace.  Have your defibrillator checked every 3-6 months or as often as told by your health care provider. Most defibrillators last for 4-8 years.  Keep all follow-up visits as told by your health care provider. This is important for your health care provider to make sure your chest is healing the way it should. Ask your health care provider when you should come back to have your stitches or staples taken out. Contact a health care provider if:  You feel one shock in your chest.  You gain weight suddenly.  Your legs or feet swell more than they have before.  It feels like your heart is fluttering or skipping beats (heart palpitations).  You have more redness, swelling, or pain around your incision.  You have more fluid or blood coming from your incision.  Your incision feels warm to the touch.  You have pus or a bad smell coming from your incision.  You have a fever. Get help right away if:  You have chest pain.  You feel more than one shock.  You feel more short of breath than you have felt before.  You feel more light-headed than you have felt before.  Your incision starts to open up. This information is not intended to replace advice given to you by your health care provider. Make sure you discuss any questions you have with your health care provider. Document Released: 12/04/2004 Document Revised: 12/05/2015 Document Reviewed: 10/22/2015 Elsevier Interactive Patient Education  2018 [place] Discharge Instructions for  Pacemaker/Defibrillator Patients  ACTIVITY No heavy lifting or vigorous activity with your left/right arm for 6 to 8 weeks.  Do not raise your left/right arm above your head for one week.  Gradually raise your affected arm as drawn below.           __  NO DRIVING for     ; you may begin driving on     .  WOUND CARE - Keep the wound area clean and dry.  Do not get this area  wet for one week. No showers for one week; you may shower on     . - The tape/steri-strips on your wound will fall off; do not pull them off.  No bandage is needed on the site.  DO  NOT apply any creams, oils, or ointments to the wound area. - If you notice any drainage or discharge from the wound, any swelling or bruising at the site, or you develop a fever > 101? F after you are discharged home, call the office at once.  SPECIAL INSTRUCTIONS - You are still able to use cellular telephones; use the ear opposite the side where you have your pacemaker/defibrillator.  Avoid carrying your cellular phone near your device. - When traveling through airports, show security personnel your identification card to avoid being screened in the metal detectors.  Ask the security personnel to use the hand wand. - Avoid arc welding equipment, MRI testing (magnetic resonance imaging), TENS units (transcutaneous nerve stimulators).  Call the office for questions about other devices. - Avoid electrical appliances that are in poor condition or are not properly grounded. - Microwave ovens are safe to be near or to operate.  ADDITIONAL INFORMATION FOR DEFIBRILLATOR PATIENTS SHOULD  YOUR DEVICE GO OFF: - If your device goes off ONCE and you feel fine afterward, notify the device clinic nurses. - If your device goes off ONCE and you do not feel well afterward, call 911. - If your device goes off TWICE, call 911. - If your device goes off [place], call 911.  DO NOT DRIVE YOURSELF OR A FAMILY MEMBER WITH A DEFIBRILLATOR TO THE HOSPITAL--CALL 911.

## 2019-03-19 NOTE — Progress Notes (Signed)
Electrophysiology Office Note   Date:  03/19/2019   ID:  Tamara Silva, DOB 1961-01-12, MRN [id number]  PCP:  Ladell Pier, MD  Cardiologist:  Tamara Silva Primary Electrophysiologist:  Tamara Jeffords Meredith Leeds, MD    Chief Complaint: CHF   History of Present Illness: Tamara Silva is a 58 y.o. female who is being seen today for the evaluation of CHV at the request of Larey Dresser, MD. Presenting today for electrophysiology evaluation.  History of chronic systolic heart failure, hypertension, type 2 diabetes, hyperlipidemia, CVA, and coronary artery disease.  She had a STEMI in 2015.  She had a drug-eluting stent placed to her distal RCA.  She has had frequent admissions for anemia and CHF.  She has obstructive sleep apnea and COPD and is on home oxygen and BiPAP.  She was admitted 10/25/2018 with shortness of breath and edema as well as a productive cough.  Her hospital course was complicated by PEA arrest on 10/28/2018 with CPR for 25 minutes.  She was extubated 10/30/2018.  She developed acute renal failure.  She was diuresed 65 pounds total.  Her ejection fraction was found to be 30 to 35%.  She was readmitted June 2020 with volume overload.  She has unfortunately been minimally compliant with her BiPAP.  Today, she denies symptoms of palpitations, chest pain, shortness of breath, orthopnea, PND, lower extremity edema, claudication, dizziness, presyncope, syncope, bleeding, or neurologic sequela. The patient is tolerating medications without difficulties.    Past Medical History:  Diagnosis Date  . Asthma   . CAD (coronary artery disease)   . Chronic combined systolic (congestive) and diastolic (congestive) heart failure ([place])   . Chronic iron deficiency anemia   . CVA (cerebral vascular accident) ([place]) 05/2018  . Diabetes mellitus without complication ([place])   . DOE (dyspnea on exertion)   . Edema of both legs   . Hepatic steatosis   . Hypertension   . Pulmonary emboli ([place]) 2014   . Respiratory failure ([place]) 09/2018  . Seizure ([place]) 05/2018   Past Surgical History:  Procedure Laterality Date  . BIOPSY  07/10/2018   Procedure: BIOPSY;  Surgeon: [name], MD;  Location: [facility];  Service: Gastroenterology;;  . COLONOSCOPY WITH PROPOFOL N/A 08/03/2013   Procedure: COLONOSCOPY WITH PROPOFOL;  Surgeon: Jeryl Columbia, MD;  Location: [facility];  Service: Endoscopy;  Laterality: N/A;  . COLONOSCOPY WITH PROPOFOL N/A 07/10/2018   Procedure: COLONOSCOPY WITH PROPOFOL;  Surgeon: [name], MD;  Location: [facility];  Service: Gastroenterology;  Laterality: N/A;  . CORONARY ANGIOPLASTY WITH STENT PLACEMENT     CAD in 2006 x 2 and 2009 2 more- place din REx in [place] and [place] [facility]  . CORONARY ANGIOPLASTY WITH STENT PLACEMENT  10/07/2013   Xience [place] DES 2.75  mm x 15  mm  . ESOPHAGOGASTRODUODENOSCOPY (EGD) WITH PROPOFOL N/A 08/03/2013   Procedure: ESOPHAGOGASTRODUODENOSCOPY (EGD) WITH PROPOFOL;  Surgeon: Jeryl Columbia, MD;  [facility];  Service: Endoscopy;  Laterality: N/A;  . ESOPHAGOGASTRODUODENOSCOPY (EGD) WITH PROPOFOL N/A 07/10/2018   Procedure: ESOPHAGOGASTRODUODENOSCOPY (EGD) WITH PROPOFOL;  Surgeon: [name], MD;  Location: [facility];  Service: Gastroenterology;  Laterality: N/A;  . LEFT HEART CATHETERIZATION WITH CORONARY ANGIOGRAM N/A 10/07/2013   Procedure: LEFT HEART CATHETERIZATION WITH CORONARY ANGIOGRAM;  Surgeon: Leonie Man, MD;  Location: [facility] CATH LAB;  Service: Cardiovascular;  Laterality: N/A;  . RIGHT HEART CATH N/A 11/06/2018   Procedure: RIGHT HEART CATH;  Surgeon: Larey Dresser,  MD;  Location: [facility] CV LAB;  Service: Cardiovascular;  Laterality: N/A;  . RIGHT/LEFT HEART CATH AND CORONARY ANGIOGRAPHY N/A 02/12/2019   Procedure: RIGHT/LEFT HEART CATH AND CORONARY ANGIOGRAPHY;  Surgeon: Larey Dresser, MD;  Location: [facility] CV LAB;  Service: Cardiovascular;  Laterality: N/A;     Current Outpatient Medications   Medication Sig Dispense Refill  . ACCU-CHEK AVIVA PLUS test strip USE 1 STRIP TO CHECK BLOOD GLUCOSE 3 TIMES A DAY 100 each 2  . acetaminophen (TYLENOL) 500 MG tablet Take 500 mg by mouth every 6 (six) hours as needed for moderate pain or headache.    . albuterol (PROAIR HFA) 108 (90 Base) MCG/ACT inhaler INHALE 2 PUFFS BY MOUTH 4 TIMES DAILY AS NEEDED 18 g 11  . aspirin EC 81 MG tablet Take 1 tablet (81 mg total) by mouth daily. 90 tablet 3  . bisoprolol (ZEBETA) 5 MG tablet Take 1 tablet (5 mg total) by mouth daily. 30 tablet 6  . ferrous sulfate 325 (65 FE) MG tablet Take 325 mg by mouth daily with breakfast.    . hydrocerin (EUCERIN) CREA Apply 1 application topically 2 (two) times daily.  0  . insulin aspart (NOVOLOG FLEXPEN) 100 UNIT/ML FlexPen Inject 10 units three times daily before each meal 15 mL 0  . insulin detemir (LEVEMIR) 100 UNIT/ML injection Inject 20 units [place] BID. 10 mL 2  . ipratropium-albuterol (DUONEB) 0.5-2.5 (3) MG/3ML SOLN Use nebulizer to inhale contents of one vial every 6 hours as needed for shortness of breath, wheezing 360 mL 11  . isosorbide-hydrALAZINE (BIDIL) 20-37.5 MG tablet Take 1 tablet by mouth 3 (three) times daily. 90 tablet 6  . liraglutide (VICTOZA) 18 MG/3ML SOPN 0.6 ml subcut x 1 wk then increase to 1.2 ml daily 3 mL 3  . NEEDLE, DISP, 26 G 26G X 1/2" MISC 1 application by Does not apply route daily. 100 each 0  . omeprazole (PRILOSEC) 40 MG capsule Take 1 capsule (40 mg total) by mouth daily. 90 capsule 3  . PAZEO 0.7 % SOLN Place 1 drop into both eyes daily.     . potassium chloride SA (K-DUR) 20 MEQ tablet Take 1 tablet (20 mEq total) by mouth daily. 30 tablet 3  . RESTASIS 0.05 % ophthalmic emulsion Place 1 drop into both eyes 2 (two) times daily.     . rosuvastatin (CRESTOR) 40 MG tablet TAKE 1 TABLET (40 MG TOTAL) BY MOUTH DAILY AT 6 PM. 30 tablet 3  . spironolactone (ALDACTONE) 25 MG tablet Take 1 tablet (25 mg total) by mouth daily. 90 tablet 1   . torsemide (DEMADEX) 20 MG tablet Take 3 tablets (60 mg total) by mouth 2 (two) times daily. 150 tablet 3  . umeclidinium-vilanterol (ANORO ELLIPTA) 62.5-25 MCG/INH AEPB Inhale 1 puff into the lungs daily. 1 each 11  . vitamin B-12 (CYANOCOBALAMIN) 1000 MCG tablet Take 1,000 mcg by mouth daily.     No current facility-administered medications for this visit.     Allergies:   Metolazone, Plavix [clopidogrel bisulfate], Ibuprofen, and Nsaids   Social History:  The patient  reports that she quit smoking about 4 years ago. Her smoking use included cigarettes. She has a 10.00 pack-year smoking history. She has never used smokeless tobacco. She reports previous drug use. Drug: Cocaine. She reports that she does not drink alcohol.   Family History:  The patient's family history includes Asthma in her paternal aunt; Breast cancer in her paternal aunt;  Colon cancer in her father; Diabetes in her father; Emphysema in her brother; Heart attack in her father; Hypertension in her mother; Lung disease in her brother; Sarcoidosis in an other family member; Stroke in her mother.    ROS:  Please see the history of present illness.   Otherwise, review of systems is positive for none.   All other systems are reviewed and negative.    PHYSICAL EXAM: VS:  BP 104/62   Pulse 71   Ht 5' 7.5" (1.715 m)   Wt 279 lb 12.8 oz (126.9 kg)   LMP 07/28/2013 Comment: perimenopausal  SpO2 98%   BMI 43.18 kg/m  , BMI Body mass index is 43.18 kg/m. GEN: Well nourished, well developed, in no acute distress  HEENT: normal  Neck: no JVD, carotid bruits, or masses Cardiac: RRR; no murmurs, rubs, or gallops,no edema  Respiratory:  clear to auscultation bilaterally, normal work of breathing GI: soft, nontender, nondistended, + BS MS: no deformity or atrophy  Skin: warm and dry Neuro:  Strength and sensation are intact Psych: euthymic mood, full affect  EKG:  EKG is not ordered today. Personal review of the ekg ordered  02/07/19 shows sinus rhythm, rate 70, inferior infarct  Recent Labs: 10/28/2018: TSH 5.177 11/01/2018: ALT 16 11/17/2018: B Natriuretic Peptide 620.7 11/30/2018: Magnesium 1.7 02/07/2019: Platelets 209 02/12/2019: Hemoglobin 11.2 03/15/2019: BUN 36; Creatinine, Ser 1.44; Potassium 3.6; Sodium 140    Lipid Panel     Component Value Date/Time   CHOL 137 02/07/2019 1205   TRIG 145 02/07/2019 1205   HDL 56 02/07/2019 1205   CHOLHDL 2.4 02/07/2019 1205   VLDL 29 02/07/2019 1205   LDLCALC 52 02/07/2019 1205     Wt Readings from Last 3 Encounters:  03/19/19 279 lb 12.8 oz (126.9 kg)  03/16/19 279 lb 12.8 oz (126.9 kg)  03/02/19 278 lb 9.6 oz (126.4 kg)      Other studies Reviewed: Additional studies/ records that were reviewed today include: TTE 03/15/19  Review of the above records today demonstrates:   1. Left ventricular ejection fraction, by visual estimation, is 30 to 35%. The left ventricle has moderately decreased function. Mildly increased left ventricular size. There is no left ventricular hypertrophy. The mid to apical anteroseptal and  inferoseptal walls were akinetic, the apical lateral wall appeared aneurysmal, the apical inferior wall was akinetic and the true apex was akinetic.  2. Left ventricular diastolic Doppler parameters are consistent with impaired relaxation pattern of LV diastolic filling.  3. Global right ventricle has normal systolic function.The right ventricular size is normal. No increase in right ventricular wall thickness.  4. Left atrial size was normal.  5. Right atrial size was normal.  6. Mild mitral annular calcification.  7. The mitral valve is normal in structure. No evidence of mitral valve regurgitation. No evidence of mitral stenosis.  8. The tricuspid valve is normal in structure. Tricuspid valve regurgitation was not visualized by color flow Doppler.  9. The aortic valve is tricuspid Aortic valve regurgitation was not visualized by color flow  Doppler. Structurally normal aortic valve, with no evidence of sclerosis or stenosis. 10. TR signal is inadequate for assessing pulmonary artery systolic pressure. 11. Technically difficult study with poor acoustic windows.  LHC/RHC 02/12/19 1. Mildly elevated left and right heart filling pressures with primarily pulmonary venous hypertension and preserved cardiac output.  2. 75% ostial stenosis in a moderate D1, otherwise nonobstructive disease.  No interventional target.     ASSESSMENT AND  PLAN:  1.  Chronic systolic heart failure due to likely ischemic cardiomyopathy: Ejection fraction 30 to 35%.  Currently on optimal medical therapy with BiDil, Aldactone.  Has not been able to tolerate ACE inhibitor/ARB due to CKD.  I discussed with her the possibility of ICD therapy.  Risks and benefits include bleeding, tamponade, infection, pneumothorax.  Understands the risks of the procedure and has agreed.    We Cooper Moroney discuss this with her referring cardiologist.  2.  Coronary artery disease: Status post RCA PCI.  No current chest pain.  3.  CKD stage III: Plan per primary cardiology  4.  Obstructive sleep apnea: BiPAP compliance encouraged  Current medicines are reviewed at length with the patient today.   The patient does not have concerns regarding her medicines.  The following changes were made today:  none  Labs/ tests ordered today include:  Orders Placed This Encounter  Procedures  . Basic metabolic panel  . CBC     Disposition:   FU with [facility] Treat once coronavirus restrictions have eased  Signed, Pranshu Lyster Meredith Leeds, MD  03/19/2019 12:15 PM     [facility] [address] [place] [address] [phone] (office) [phone] (fax)

## 2019-03-20 LAB — CBC
Hematocrit: 35.7 % (ref 34.0–46.6)
Hemoglobin: 11 g/dL — ABNORMAL LOW (ref 11.1–15.9)
MCH: 26.9 pg (ref 26.6–33.0)
MCHC: 30.8 g/dL — ABNORMAL LOW (ref 31.5–35.7)
MCV: 87 fL (ref 79–97)
Platelets: 260 10*3/uL (ref 150–450)
RBC: 4.09 x10E6/uL (ref 3.77–5.28)
RDW: 12.7 % (ref 11.7–15.4)
WBC: 8.1 10*3/uL (ref 3.4–10.8)

## 2019-03-20 LAB — BASIC METABOLIC PANEL
BUN/Creatinine Ratio: 20 (ref 9–23)
BUN: 30 mg/dL — ABNORMAL HIGH (ref 6–24)
CO2: 30 mmol/L — ABNORMAL HIGH (ref 20–29)
Calcium: 9.4 mg/dL (ref 8.7–10.2)
Chloride: 99 mmol/L (ref 96–106)
Creatinine, Ser: 1.48 mg/dL — ABNORMAL HIGH (ref 0.57–1.00)
GFR calc Af Amer: 44 mL/min/{1.73_m2} — ABNORMAL LOW (ref >59)
GFR calc non Af Amer: 38 mL/min/{1.73_m2} — ABNORMAL LOW (ref >59)
Glucose: 141 mg/dL — ABNORMAL HIGH (ref 65–99)
Potassium: 4.5 mmol/L (ref 3.5–5.2)
Sodium: 142 mmol/L (ref 134–144)

## 2019-03-22 ENCOUNTER — Telehealth (HOSPITAL_COMMUNITY): Payer: Self-pay

## 2019-03-22 NOTE — Telephone Encounter (Signed)
I called Ms Bjelland to schedule an appointment. Her phone did not ring but instead went to an automated message stating "This phone can not receive calls at this time. Please try again later." I will try again tomorrow and stop by if I am unable to reach her.

## 2019-03-23 ENCOUNTER — Telehealth (HOSPITAL_COMMUNITY): Payer: Self-pay

## 2019-03-23 NOTE — Telephone Encounter (Signed)
I called Tamara Silva to see how she is doing. She reported feeling well and denied needing anything at this time. She has an appointment on Tuesday of next week and then a COVID-19 test on Saturday. We decided to meet on Thursday of next week at 10:00.

## 2019-03-26 ENCOUNTER — Other Ambulatory Visit (HOSPITAL_COMMUNITY): Payer: Self-pay | Admitting: Adult Health

## 2019-03-27 ENCOUNTER — Other Ambulatory Visit: Payer: Self-pay

## 2019-03-27 ENCOUNTER — Ambulatory Visit: Payer: Medicaid Other | Admitting: Adult Health

## 2019-03-27 ENCOUNTER — Ambulatory Visit: Payer: Medicaid Other | Admitting: Internal Medicine

## 2019-03-27 ENCOUNTER — Encounter: Payer: Self-pay | Admitting: Adult Health

## 2019-03-27 DIAGNOSIS — I5042 Chronic combined systolic (congestive) and diastolic (congestive) heart failure: Secondary | ICD-10-CM

## 2019-03-27 DIAGNOSIS — J449 Chronic obstructive pulmonary disease, unspecified: Secondary | ICD-10-CM | POA: Diagnosis not present

## 2019-03-27 DIAGNOSIS — J438 Other emphysema: Secondary | ICD-10-CM

## 2019-03-27 DIAGNOSIS — E662 Morbid (severe) obesity with alveolar hypoventilation: Secondary | ICD-10-CM | POA: Diagnosis not present

## 2019-03-27 MED ORDER — PERFLUTREN LIPID MICROSPHERE
1.0000 mL | INTRAVENOUS | Status: AC | PRN
  Administered 2019-03-15: 3 mL via INTRAVENOUS

## 2019-03-27 NOTE — Assessment & Plan Note (Signed)
Appears euvolemic without evidence of volume overload on exam Continue follow-up with cardiology and continue on current regimen.  Low-salt diet

## 2019-03-27 NOTE — Patient Instructions (Addendum)
Continue on ANORO 1 puff daily , rinse after use.  Duoneb every 6hr as needed for wheezing .  Continue on Oxygen 2l/m .  Order for POC oxygen device.  Restart BIPAP  At bedtime  -very important to wear all night .  Work on [employer]  Do not drive if sleepy.  Follow up with Dr. Hermina Staggers in 2 months and As needed   Please contact office for sooner follow up if symptoms do not improve or worsen or seek emergency care

## 2019-03-27 NOTE — Progress Notes (Signed)
@Patient  ID: Tamara Silva, female    DOB: [birth year], 58 y.o.   MRN: [id number]  Chief Complaint  Patient presents with  . Follow-up    COPD     Referring provider: Ladell Pier, MD  HPI: 58 year old female former smoker followed for COPD, OSA/OHS, chronic respiratory failure on oxygen Medical history significant for coronary artery disease, congestive heart failure (EF 30 to 35%), PEA arrest May 2020, diabetes, ischemic strokes complicated by seizures  TEST/EVENTS :  Sleep study done 11/2014 showed Mild OSA with AHI at 5.7, mild desats ~82%.   Spirometry 12/2014 showed FEV1 at 34%, ratio 66.    03/27/2019 Follow up : COPD , O2 RF  Patient returns for a follow-up visit.  She was last seen May 2019.  Patient has had a difficult year with medical issues.  Patient had hospitalization in January and February 2020 with stroke and seizure.  She was admitted May 2020 with progressive shortness of breath and edema found to be in decompensated congestive heart failure during hospitalization she had a PEA arrest.  She received CPR for 25 minutes.  With ROSC.  She was successfully weaned and extubated from vent.  Since discharge she has been seen by cardiology and has plans for an ICD implantation next week. She says she has not smoked in several months.  She says breathing has been doing better she has decreased cough and congestion. She remains on Anoro daily.  She is on oxygen 2 L.  She does request a portable oxygen device.  O2 saturations on room air at rest and walking are 88%.  On O2 at 2 L O2 saturations are greater than 90%. Patient is supposed to be on BiPAP at night.  She does admit she has not been wearing this.  We discussed importance of compliance.  Patient education on potential complications of untreated sleep apnea and hypoxemia.    Allergies  Allergen Reactions  . Metolazone Other (See Comments)    Dizziness and falling  . Plavix [Clopidogrel Bisulfate] Other (See  Comments)    High PRU's - non-responder  . Ibuprofen Other (See Comments)    Pt states she is not supposed to take ibuprofen because of other meds she is taking  . Nsaids Other (See Comments)    "I do not take NSAIDs d/t interfering with other meds"    Immunization History  Administered Date(s) Administered  . Influenza,inj,Quad PF,6+ Mos 02/08/2014, 05/22/2015, 06/13/2018, 01/19/2019  . Pneumococcal Polysaccharide-23 01/19/2019    Past Medical History:  Diagnosis Date  . Asthma   . CAD (coronary artery disease)   . Chronic combined systolic (congestive) and diastolic (congestive) heart failure ([place])   . Chronic iron deficiency anemia   . CVA (cerebral vascular accident) ([place]) 05/2018  . Diabetes mellitus without complication ([place])   . DOE (dyspnea on exertion)   . Edema of both legs   . Hepatic steatosis   . Hypertension   . Pulmonary emboli ([place]) 2014  . Respiratory failure ([place]) 09/2018  . Seizure ([place]) 05/2018    Tobacco History: Social History   Tobacco Use  Smoking Status Former Smoker  . Packs/day: 0.50  . Years: 20.00  . Pack years: 10.00  . Types: Cigarettes  . Quit date: 05/31/2018  . Years since quitting: 0.8  Smokeless Tobacco Never Used  Tobacco Comment   smoked off and on x 20 years   Counseling given: Not Answered Comment: smoked off and on x 20 years  Outpatient Medications Prior to Visit  Medication Sig Dispense Refill  . ACCU-CHEK AVIVA PLUS test strip USE 1 STRIP TO CHECK BLOOD GLUCOSE 3 TIMES A DAY 100 each 2  . acetaminophen (TYLENOL) 500 MG tablet Take 500 mg by mouth every 6 (six) hours as needed for moderate pain or headache.    . albuterol (PROAIR HFA) 108 (90 Base) MCG/ACT inhaler INHALE 2 PUFFS BY MOUTH 4 TIMES DAILY AS NEEDED 18 g 11  . aspirin EC 81 MG tablet Take 1 tablet (81 mg total) by mouth daily. 90 tablet 3  . bisoprolol (ZEBETA) 5 MG tablet Take 1 tablet (5 mg total) by mouth daily. 30 tablet 6  . ferrous sulfate 325 (65 FE)  MG tablet Take 325 mg by mouth daily with breakfast.    . hydrocerin (EUCERIN) CREA Apply 1 application topically 2 (two) times daily.  0  . insulin aspart (NOVOLOG FLEXPEN) 100 UNIT/ML FlexPen Inject 10 units three times daily before each meal 15 mL 0  . insulin detemir (LEVEMIR) 100 UNIT/ML injection Inject 20 units [place] BID. 10 mL 2  . ipratropium-albuterol (DUONEB) 0.5-2.5 (3) MG/3ML SOLN Use nebulizer to inhale contents of one vial every 6 hours as needed for shortness of breath, wheezing 360 mL 11  . isosorbide-hydrALAZINE (BIDIL) 20-37.5 MG tablet Take 1 tablet by mouth 3 (three) times daily. 90 tablet 6  . liraglutide (VICTOZA) 18 MG/3ML SOPN 0.6 ml subcut x 1 wk then increase to 1.2 ml daily 3 mL 3  . NEEDLE, DISP, 26 G 26G X 1/2" MISC 1 application by Does not apply route daily. 100 each 0  . omeprazole (PRILOSEC) 40 MG capsule Take 1 capsule (40 mg total) by mouth daily. 90 capsule 3  . PAZEO 0.7 % SOLN Place 1 drop into both eyes daily.     . potassium chloride SA (KLOR-CON) 20 MEQ tablet TAKE 1 TABLET (20 MEQ TOTAL) BY MOUTH DAILY. 30 tablet 3  . RESTASIS 0.05 % ophthalmic emulsion Place 1 drop into both eyes 2 (two) times daily.     . rosuvastatin (CRESTOR) 40 MG tablet TAKE 1 TABLET (40 MG TOTAL) BY MOUTH DAILY AT 6 PM. 30 tablet 3  . spironolactone (ALDACTONE) 25 MG tablet Take 1 tablet (25 mg total) by mouth daily. 90 tablet 1  . torsemide (DEMADEX) 20 MG tablet Take 3 tablets (60 mg total) by mouth 2 (two) times daily. 150 tablet 3  . umeclidinium-vilanterol (ANORO ELLIPTA) 62.5-25 MCG/INH AEPB Inhale 1 puff into the lungs daily. 1 each 11  . vitamin B-12 (CYANOCOBALAMIN) 1000 MCG tablet Take 1,000 mcg by mouth daily.     No facility-administered medications prior to visit.      Review of Systems:   Constitutional:   No  weight loss, night sweats,  Fevers, chills,  +fatigue, or  lassitude.  HEENT:   No headaches,  Difficulty swallowing,  Tooth/dental problems, or  Sore  throat,                No sneezing, itching, ear ache, nasal congestion, post nasal drip,   CV:  No chest pain,  Orthopnea, PND, swelling in lower extremities, anasarca, dizziness, palpitations, syncope.   GI  No heartburn, indigestion, abdominal pain, nausea, vomiting, diarrhea, change in bowel habits, loss of appetite, bloody stools.   Resp: .  No chest wall deformity  Skin: no rash or lesions.  GU: no dysuria, change in color of urine, no urgency or frequency.  No flank  pain, no hematuria   MS:  No joint pain or swelling.  No decreased range of motion.  No back pain.    Physical Exam  BP 130/72 (BP Location: Left Arm, Cuff Size: Large)   Pulse 74   Temp (!) 97.3 F (36.3 C) (Temporal)   Ht 5' 7.5" (1.715 m)   Wt 278 lb 12.8 oz (126.5 kg)   LMP 07/28/2013 Comment: perimenopausal  SpO2 98%   BMI 43.02 kg/m   GEN: A/Ox3; pleasant , NAD , Obese , on O2    HEENT:  [place]/AT,   NOSE-clear, THROAT-clear, no lesions, no postnasal drip or exudate noted. Class 3 MP airway   NECK:  Supple w/ fair ROM; no JVD; normal carotid impulses w/o bruits; no thyromegaly or nodules palpated; no lymphadenopathy.    RESP  Clear  P & A; w/o, wheezes/ rales/ or rhonchi. no accessory muscle use, no dullness to percussion  CARD:  RRR, Gr 1-2 SM  tr  peripheral edema, pulses intact, no cyanosis or clubbing.  GI:   Soft & nt; nml bowel sounds; no organomegaly or masses detected.   Musco: Warm bil, no deformities or joint swelling noted.   Neuro: alert, no focal deficits noted.    Skin: Warm, no lesions or rashes    Lab Results:  CBC   Imaging: No results found.    No flowsheet data found.  No results found for: NITRICOXIDE      Assessment & Plan:   COPD (chronic obstructive pulmonary disease) (HCC) Appears stable.  Congratulated on smoking cessation Plan  Patient Instructions  Continue on ANORO 1 puff daily , rinse after use.  Duoneb every 6hr as needed for wheezing .   Continue on Oxygen 2l/m .  Order for POC oxygen device.  Restart BIPAP  At bedtime  -very important to wear all night .  Work on [employer]  Do not drive if sleepy.  Follow up with Dr. Hermina Staggers in 2 months and As needed   Please contact office for sooner follow up if symptoms do not improve or worsen or seek emergency care        Obesity hypoventilation syndrome ([place]) OSA/OHS-encouraged on BiPAP compliance  Plan  Patient Instructions  Continue on ANORO 1 puff daily , rinse after use.  Duoneb every 6hr as needed for wheezing .  Continue on Oxygen 2l/m .  Order for POC oxygen device.  Restart BIPAP  At bedtime  -very important to wear all night .  Work on [employer]  Do not drive if sleepy.  Follow up with Dr. Hermina Staggers in 2 months and As needed   Please contact office for sooner follow up if symptoms do not improve or worsen or seek emergency care        Chronic combined systolic and diastolic heart failure ([place]) Appears euvolemic without evidence of volume overload on exam Continue follow-up with cardiology and continue on current regimen.  Low-salt diet     [name], NP 03/27/2019

## 2019-03-27 NOTE — Assessment & Plan Note (Signed)
OSA/OHS-encouraged on BiPAP compliance  Plan  Patient Instructions  Continue on ANORO 1 puff daily , rinse after use.  Duoneb every 6hr as needed for wheezing .  Continue on Oxygen 2l/m .  Order for POC oxygen device.  Restart BIPAP  At bedtime  -very important to wear all night .  Work on [employer]  Do not drive if sleepy.  Follow up with Dr. Hermina Staggers in 2 months and As needed   Please contact office for sooner follow up if symptoms do not improve or worsen or seek emergency care

## 2019-03-27 NOTE — Assessment & Plan Note (Signed)
Appears stable.  Congratulated on smoking cessation Plan  Patient Instructions  Continue on ANORO 1 puff daily , rinse after use.  Duoneb every 6hr as needed for wheezing .  Continue on Oxygen 2l/m .  Order for POC oxygen device.  Restart BIPAP  At bedtime  -very important to wear all night .  Work on [employer]  Do not drive if sleepy.  Follow up with Dr. Hermina Staggers in 2 months and As needed   Please contact office for sooner follow up if symptoms do not improve or worsen or seek emergency care

## 2019-03-28 MED ORDER — ANORO ELLIPTA 62.5-25 MCG/INH IN AEPB: 1.0000 | INHALATION_SPRAY | Freq: Every day | RESPIRATORY_TRACT | 5 refills | Status: DC

## 2019-03-28 NOTE — Addendum Note (Signed)
Addended by: Parke Poisson E on: 03/28/2019 03:18 PM   Modules accepted: Orders

## 2019-03-29 ENCOUNTER — Telehealth (HOSPITAL_COMMUNITY): Payer: Self-pay

## 2019-03-29 ENCOUNTER — Other Ambulatory Visit: Payer: Self-pay | Admitting: Internal Medicine

## 2019-03-29 ENCOUNTER — Telehealth (HOSPITAL_COMMUNITY): Payer: Self-pay | Admitting: Licensed Clinical Social Worker

## 2019-03-29 MED ORDER — LIRAGLUTIDE 18 MG/3ML ~~LOC~~ SOPN: PEN_INJECTOR | SUBCUTANEOUS | 2 refills | Status: DC

## 2019-03-29 NOTE — Telephone Encounter (Signed)
Ms Blansett called me this morning to advise that she is not able to get a refill of Victoza because the pharmacy said it was too early. She originally was given a prescription to inject starting at 0.8 mg daily and would eventually build to a 1.8 mg injection daily but the pharmacy stated they did not have that dose on the prescription and thus could not fill it. I will reach out to [facility] to get this handled.

## 2019-03-29 NOTE — Telephone Encounter (Signed)
CSW informed by community paramedic that pt does not have transport to get to appt tomorrow at [facility].  Pt is too late to set up Medicaid transport and states she has not finalized the SCAT process at this time so she cannot utilize them.  CSW will provide pt with taxi to appt tomorrow- pt also reports concerns with getting to COVID test on Saturday which she needs prior to her procedure next week.  CSW able to get pt [employer] to assist with gas money which pt reports is the main barrier to getting to COVID test.  CSW will continue to follow and assist as needed  Jorge Ny, [facility] Desk#: [phone] Cell#: [phone]

## 2019-03-29 NOTE — Telephone Encounter (Signed)
1) Medication(s) Requested (by name): victoza 1.8   Pharmacy still has the old prescription of the victoza and is needing to get the new prescription for 1.8 as she is running out. Please follow up 2) Pharmacy of Choice: [facility] 3) Special Requests:   Approved medications will be sent to the pharmacy, we will reach out if there is an issue.  Requests made after 3pm may not be addressed until the following business day!  If a patient is unsure of the name of the medication(s) please note and ask patient to call back when they are able to provide all info, do not send to responsible party until all information is available!

## 2019-03-30 ENCOUNTER — Ambulatory Visit: Payer: Medicaid Other | Attending: Internal Medicine | Admitting: Internal Medicine

## 2019-03-30 ENCOUNTER — Other Ambulatory Visit: Payer: Self-pay

## 2019-03-30 ENCOUNTER — Telehealth (HOSPITAL_COMMUNITY): Payer: Self-pay | Admitting: Licensed Clinical Social Worker

## 2019-03-30 ENCOUNTER — Encounter: Payer: Self-pay | Admitting: Internal Medicine

## 2019-03-30 VITALS — BP 131/63 | HR 73 | Temp 98.4°F | Resp 16 | Wt 282.2 lb

## 2019-03-30 DIAGNOSIS — I5042 Chronic combined systolic (congestive) and diastolic (congestive) heart failure: Secondary | ICD-10-CM | POA: Diagnosis not present

## 2019-03-30 DIAGNOSIS — R471 Dysarthria and anarthria: Secondary | ICD-10-CM | POA: Insufficient documentation

## 2019-03-30 DIAGNOSIS — Z87891 Personal history of nicotine dependence: Secondary | ICD-10-CM | POA: Insufficient documentation

## 2019-03-30 DIAGNOSIS — I2510 Atherosclerotic heart disease of native coronary artery without angina pectoris: Secondary | ICD-10-CM | POA: Insufficient documentation

## 2019-03-30 DIAGNOSIS — E1142 Type 2 diabetes mellitus with diabetic polyneuropathy: Secondary | ICD-10-CM | POA: Insufficient documentation

## 2019-03-30 DIAGNOSIS — Z794 Long term (current) use of insulin: Secondary | ICD-10-CM | POA: Insufficient documentation

## 2019-03-30 DIAGNOSIS — Z7982 Long term (current) use of aspirin: Secondary | ICD-10-CM | POA: Diagnosis not present

## 2019-03-30 DIAGNOSIS — E662 Morbid (severe) obesity with alveolar hypoventilation: Secondary | ICD-10-CM | POA: Diagnosis not present

## 2019-03-30 DIAGNOSIS — Z8249 Family history of ischemic heart disease and other diseases of the circulatory system: Secondary | ICD-10-CM | POA: Diagnosis not present

## 2019-03-30 DIAGNOSIS — E785 Hyperlipidemia, unspecified: Secondary | ICD-10-CM | POA: Insufficient documentation

## 2019-03-30 DIAGNOSIS — J449 Chronic obstructive pulmonary disease, unspecified: Secondary | ICD-10-CM | POA: Insufficient documentation

## 2019-03-30 DIAGNOSIS — G4733 Obstructive sleep apnea (adult) (pediatric): Secondary | ICD-10-CM

## 2019-03-30 DIAGNOSIS — E1122 Type 2 diabetes mellitus with diabetic chronic kidney disease: Secondary | ICD-10-CM | POA: Diagnosis not present

## 2019-03-30 DIAGNOSIS — J9611 Chronic respiratory failure with hypoxia: Secondary | ICD-10-CM | POA: Diagnosis not present

## 2019-03-30 DIAGNOSIS — K760 Fatty (change of) liver, not elsewhere classified: Secondary | ICD-10-CM | POA: Diagnosis not present

## 2019-03-30 DIAGNOSIS — D509 Iron deficiency anemia, unspecified: Secondary | ICD-10-CM | POA: Insufficient documentation

## 2019-03-30 DIAGNOSIS — IMO0002 Reserved for concepts with insufficient information to code with codable children: Secondary | ICD-10-CM

## 2019-03-30 DIAGNOSIS — Z79899 Other long term (current) drug therapy: Secondary | ICD-10-CM | POA: Insufficient documentation

## 2019-03-30 DIAGNOSIS — D61818 Other pancytopenia: Secondary | ICD-10-CM | POA: Diagnosis not present

## 2019-03-30 DIAGNOSIS — E1165 Type 2 diabetes mellitus with hyperglycemia: Secondary | ICD-10-CM | POA: Insufficient documentation

## 2019-03-30 DIAGNOSIS — I130 Hypertensive heart and chronic kidney disease with heart failure and stage 1 through stage 4 chronic kidney disease, or unspecified chronic kidney disease: Secondary | ICD-10-CM | POA: Insufficient documentation

## 2019-03-30 DIAGNOSIS — Z955 Presence of coronary angioplasty implant and graft: Secondary | ICD-10-CM | POA: Diagnosis not present

## 2019-03-30 LAB — GLUCOSE, POCT (MANUAL RESULT ENTRY): POC Glucose: 270 mg/dl — AB (ref 70–99)

## 2019-03-30 MED ORDER — NOVOLOG FLEXPEN 100 UNIT/ML ~~LOC~~ SOPN: PEN_INJECTOR | SUBCUTANEOUS | 0 refills | Status: DC

## 2019-03-30 MED ORDER — INSULIN DETEMIR 100 UNIT/ML ~~LOC~~ SOLN: SUBCUTANEOUS | 2 refills | Status: DC

## 2019-03-30 NOTE — Progress Notes (Signed)
Patient ID: Tamara Silva, female    DOB: [birth year]  MRN: [id number]  CC: Diabetes and Hypertension   Subjective: Tamara Silva is a 58 y.o. female who presents for chronic ds management. Significant other is with her. Her concerns today include:  HTN: Combined chronic CHF, cardiac arrest, CAD, CVA, chronic PE, OHS (noncompliant with BiPAP), COPD, hypoxic respiratory failure on home O2 3 Lt continuous,  DM, obesity, CKD stage III, former tob dep, IDA (history GI bleed with endoscopies 07/2018 - poor prep of colon on c-scope) Vit P38 def,  DM: Trulicity added on last visit but her insurance did not cover for it so we placed her on Victoza instead.  Patient reports compliance with the medication.  She is also on Levemir 20 units twice a day and NovoLog 10 units with meals.  Reports compliance with all medications.  Checking BS TID.  Has glucometer with her.  30-day average is 248, 14-day average is 265.  In looking through her glucometer look like most of her blood sugars have been in the 2-300 range. Eating habits:  Appetite has decreased on Victoza  Combined chronic CHF/CAD: She has seen her cardiologist Dr. Aundra Dubin.  Recent repeat echo revealed EF of 30-35%.  Plan for ICD placement on the fourth of next month.  She denies any increased shortness of breath or lower extremity swelling at this time.  Reports compliance with medications.  Carvedilol was changed to bisoprolol due to COPD.  COPD/obesity hypoventilation syndrome: She saw pulmonary nurse practitioner earlier this week.  Patient tells me that a new sleep study has been ordered but I do not see that in the provider's note.  It looks like she was encourage to use her BiPAP  Lab Results  Component Value Date   HGBA1C 8.8 (A) 01/19/2019      Patient Active Problem List   Diagnosis Date Noted  . Diabetic peripheral neuropathy ([place]) 01/19/2019  . Former smoker 01/19/2019  . CKD (chronic kidney disease) stage 3, GFR 30-59 ml/min  01/19/2019  . Cardiac arrest ([place]) 10/28/2018  . Seizure ([place]) 10/28/2018  . Noncompliance with treatment plan 07/24/2018  . General patient noncompliance 07/24/2018  . Noncompliance with diet and medication regimen 07/24/2018  . Dysarthria   . BiPAP (biphasic positive airway pressure) dependence   . Pancytopenia ([place]) 07/08/2018  . Type II diabetes mellitus with renal manifestations ([place]) 07/08/2018  . Hepatic steatosis 06/22/2018  . Chronic respiratory failure with hypoxia and hypercapnia (HCC)   . On home O2   . Slow transit constipation   . Cocaine abuse ([place])   . Diabetes mellitus type 2 in obese ([place])   . Generalized tonic-clonic seizure ([place]) 06/05/2018  . Cerebral embolism with cerebral infarction 06/01/2018  . Pressure injury of skin 05/30/2018  . Iron deficiency anemia 01/27/2018  . Tobacco abuse disorder 10/03/2017  . Dyslipidemia 04/29/2016  . Primary insomnia 04/29/2016  . Obesity hypoventilation syndrome ([place])   . OSA (obstructive sleep apnea)   . Dyslipidemia associated with type 2 diabetes mellitus ([place]) 05/21/2015  . COPD GOLD III  06/10/2014  . CAD S/P percutaneous coronary angioplasty - prior PCI to LAD; RCA PCI: new Xience Alpine DES 2.75 mm x 15 mm  10/07/2013  . Morbid obesity ([place]) 07/30/2013  . Chronic combined systolic and diastolic heart failure ([place]) 07/10/2013  . Chronic pulmonary embolism ([place]) 02/08/2013  . Bipolar disorder ([place]) 02/08/2013  . Essential hypertension 02/08/2013  . COPD (chronic obstructive pulmonary disease) ([place]) 10/15/2012  .  Pulmonary embolism on right ([place]) 10/15/2012     Current Outpatient Medications on File Prior to Visit  Medication Sig Dispense Refill  . ACCU-CHEK AVIVA PLUS test strip USE 1 STRIP TO CHECK BLOOD GLUCOSE 3 TIMES A DAY 100 each 2  . acetaminophen (TYLENOL) 500 MG tablet Take 500 mg by mouth every 6 (six) hours as needed for moderate pain or headache.    . albuterol (PROAIR HFA) 108 (90 Base) MCG/ACT inhaler  INHALE 2 PUFFS BY MOUTH 4 TIMES DAILY AS NEEDED (Patient taking differently: Inhale 2 puffs into the lungs every 4 (four) hours as needed. INHALE 2 PUFFS BY MOUTH 4 TIMES DAILY AS NEEDED) 18 g 11  . aspirin EC 81 MG tablet Take 1 tablet (81 mg total) by mouth daily. 90 tablet 3  . bisoprolol (ZEBETA) 5 MG tablet Take 1 tablet (5 mg total) by mouth daily. 30 tablet 6  . ferrous sulfate 325 (65 FE) MG tablet Take 325 mg by mouth daily with breakfast.    . hydrocerin (EUCERIN) CREA Apply 1 application topically 2 (two) times daily.  0  . insulin aspart (NOVOLOG FLEXPEN) 100 UNIT/ML FlexPen Inject 10 units three times daily before each meal (Patient taking differently: Inject 10 Units into the skin 3 (three) times daily with meals. ) 15 mL 0  . insulin detemir (LEVEMIR) 100 UNIT/ML injection Inject 20 units [place] BID. (Patient taking differently: Inject 20 Units into the skin 2 (two) times daily. ) 10 mL 2  . ipratropium-albuterol (DUONEB) 0.5-2.5 (3) MG/3ML SOLN Use nebulizer to inhale contents of one vial every 6 hours as needed for shortness of breath, wheezing (Patient taking differently: Inhale 3 mLs into the lungs every 6 (six) hours as needed (shortness of breath and wheezing). ) 360 mL 11  . isosorbide-hydrALAZINE (BIDIL) 20-37.5 MG tablet Take 1 tablet by mouth 3 (three) times daily. 90 tablet 6  . liraglutide (VICTOZA) 18 MG/3ML SOPN Inject 1.8 mg into the skin daily. 9 mL 2  . NEEDLE, DISP, 26 G 26G X 1/2" MISC 1 application by Does not apply route daily. 100 each 0  . omeprazole (PRILOSEC) 40 MG capsule Take 1 capsule (40 mg total) by mouth daily. 90 capsule 3  . PAZEO 0.7 % SOLN Place 1 drop into both eyes daily.     . potassium chloride SA (KLOR-CON) 20 MEQ tablet TAKE 1 TABLET (20 MEQ TOTAL) BY MOUTH DAILY. 30 tablet 3  . RESTASIS 0.05 % ophthalmic emulsion Place 1 drop into both eyes 2 (two) times daily.     . rosuvastatin (CRESTOR) 40 MG tablet TAKE 1 TABLET (40 MG TOTAL) BY MOUTH DAILY AT 6  PM. 30 tablet 3  . spironolactone (ALDACTONE) 25 MG tablet Take 1 tablet (25 mg total) by mouth daily. 90 tablet 1  . torsemide (DEMADEX) 20 MG tablet Take 3 tablets (60 mg total) by mouth 2 (two) times daily. 150 tablet 3  . umeclidinium-vilanterol (ANORO ELLIPTA) 62.5-25 MCG/INH AEPB Inhale 1 puff into the lungs daily. 1 each 5  . vitamin B-12 (CYANOCOBALAMIN) 1000 MCG tablet Take 1,000 mcg by mouth daily.     No current facility-administered medications on file prior to visit.     Allergies  Allergen Reactions  . Metolazone Other (See Comments)    Dizziness and falling  . Plavix [Clopidogrel Bisulfate] Other (See Comments)    High PRU's - non-responder  . Ibuprofen Other (See Comments)    Pt states she is not supposed to  take ibuprofen because of other meds she is taking  . Nsaids Other (See Comments)    "I do not take NSAIDs d/t interfering with other meds"    Social History   Socioeconomic History  . Marital status: Single    Spouse name: Not on file  . Number of children: 1  . Years of education: Not on file  . Highest education level: Not on file  Occupational History  . Not on file  Social Needs  . Financial resource strain: Somewhat hard  . Food insecurity    Worry: Sometimes true    Inability: Sometimes true  . Transportation needs    Medical: Yes    Non-medical: Yes  Tobacco Use  . Smoking status: Former Smoker    Packs/day: 0.50    Years: 20.00    Pack years: 10.00    Types: Cigarettes    Quit date: 05/31/2018    Years since quitting: 0.8  . Smokeless tobacco: Never Used  . Tobacco comment: smoked off and on x 20 years  Substance and Sexual Activity  . Alcohol use: No    Alcohol/week: 0.0 standard drinks  . Drug use: Not Currently    Types: Cocaine  . Sexual activity: Not Currently  Lifestyle  . Physical activity    Days per week: 0 days    Minutes per session: 0 min  . Stress: Rather much  Relationships  . Social Herbalist on phone:  Twice a week    Gets together: Once a week    Attends religious service: 1 to 4 times per year    Active member of club or organization: No    Attends meetings of clubs or organizations: Never    Relationship status: Living with partner  . Intimate partner violence    Fear of current or ex partner: Patient refused    Emotionally abused: Patient refused    Physically abused: Patient refused    Forced sexual activity: Patient refused  Other Topics Concern  . Not on file  Social History Narrative   Origibnally from [place]   Most recently from [place]   Daughter lives in town      On Social security to CHF, COPD, CAD    Family History  Problem Relation Age of Onset  . Stroke Mother   . Hypertension Mother   . Colon cancer Father   . Diabetes Father   . Heart attack Father   . Sarcoidosis Other   . Breast cancer Paternal Aunt   . Emphysema Brother   . Lung disease Brother        Unknown type, 3 brothers, one with liver and lung disease  . Asthma Paternal Aunt     Past Surgical History:  Procedure Laterality Date  . BIOPSY  07/10/2018   Procedure: BIOPSY;  Surgeon: [name], MD;  Location: [facility];  Service: Gastroenterology;;  . COLONOSCOPY WITH PROPOFOL N/A 08/03/2013   Procedure: COLONOSCOPY WITH PROPOFOL;  Surgeon: Jeryl Columbia, MD;  Location: [facility];  Service: Endoscopy;  Laterality: N/A;  . COLONOSCOPY WITH PROPOFOL N/A 07/10/2018   Procedure: COLONOSCOPY WITH PROPOFOL;  Surgeon: [name], MD;  Location: [facility];  Service: Gastroenterology;  Laterality: N/A;  . CORONARY ANGIOPLASTY WITH STENT PLACEMENT     CAD in 2006 x 2 and 2009 2 more- place din REx in [place] and [place] [facility]  . CORONARY ANGIOPLASTY WITH STENT PLACEMENT  10/07/2013   Xience Alpine DES 2.75  mm x 15  mm  . ESOPHAGOGASTRODUODENOSCOPY (EGD) WITH PROPOFOL N/A 08/03/2013   Procedure: ESOPHAGOGASTRODUODENOSCOPY (EGD) WITH PROPOFOL;  Surgeon: Jeryl Columbia, MD;  Location: [facility];   Service: Endoscopy;  Laterality: N/A;  . ESOPHAGOGASTRODUODENOSCOPY (EGD) WITH PROPOFOL N/A 07/10/2018   Procedure: ESOPHAGOGASTRODUODENOSCOPY (EGD) WITH PROPOFOL;  Surgeon: [name], MD;  Location: [facility];  Service: Gastroenterology;  Laterality: N/A;  . LEFT HEART CATHETERIZATION WITH CORONARY ANGIOGRAM N/A 10/07/2013   Procedure: LEFT HEART CATHETERIZATION WITH CORONARY ANGIOGRAM;  Surgeon: Leonie Man, MD;  Location: [facility] CATH LAB;  Service: Cardiovascular;  Laterality: N/A;  . RIGHT HEART CATH N/A 11/06/2018   Procedure: RIGHT HEART CATH;  Surgeon: Larey Dresser, MD;  Location: [facility] CV LAB;  Service: Cardiovascular;  Laterality: N/A;  . RIGHT/LEFT HEART CATH AND CORONARY ANGIOGRAPHY N/A 02/12/2019   Procedure: RIGHT/LEFT HEART CATH AND CORONARY ANGIOGRAPHY;  Surgeon: Larey Dresser, MD;  Location: [facility] CV LAB;  Service: Cardiovascular;  Laterality: N/A;    ROS: Review of Systems Negative except as stated above  PHYSICAL EXAM: BP 131/63   Pulse 73   Temp 98.4 F (36.9 C) (Oral)   Resp 16   Wt 282 lb 3.2 oz (128 kg)   LMP 07/28/2013 Comment: perimenopausal  SpO2 96%   BMI 43.55 kg/m   Wt Readings from Last 3 Encounters:  03/30/19 282 lb 3.2 oz (128 kg)  03/27/19 278 lb 12.8 oz (126.5 kg)  03/19/19 279 lb 12.8 oz (126.9 kg)    Physical Exam  General appearance - alert, well appearing, and in no distress.  Patient sitting in wheelchair.  She is wearing her oxygen. Mental status - normal mood, behavior, speech, dress, motor activity, and thought processes Chest - clear to auscultation, no wheezes, rales or rhonchi, symmetric air entry Heart - normal rate, regular rhythm, normal S1, S2, no murmurs, rubs, clicks or gallops Extremities -no lower extremity edema.  CMP Latest Ref Rng & Units 03/19/2019 03/15/2019 02/12/2019  Glucose 65 - 99 mg/dL 141(H) 151(H) -  BUN 6 - 24 mg/dL 30(H) 36(H) -  Creatinine 0.57 - 1.00 mg/dL 1.48(H) 1.44(H) -  Sodium 134 -  144 mmol/L 142 140 142  Potassium 3.5 - 5.2 mmol/L 4.5 3.6 3.8  Chloride 96 - 106 mmol/L 99 97(L) -  CO2 20 - 29 mmol/L 30(H) 31 -  Calcium 8.7 - 10.2 mg/dL 9.4 9.5 -  Total Protein 6.5 - 8.1 g/dL - - -  Total Bilirubin 0.3 - 1.2 mg/dL - - -  Alkaline Phos 38 - 126 U/L - - -  AST 15 - 41 U/L - - -  ALT 0 - 44 U/L - - -   Lipid Panel     Component Value Date/Time   CHOL 137 02/07/2019 1205   TRIG 145 02/07/2019 1205   HDL 56 02/07/2019 1205   CHOLHDL 2.4 02/07/2019 1205   VLDL 29 02/07/2019 1205   LDLCALC 52 02/07/2019 1205    CBC    Component Value Date/Time   WBC 8.1 03/19/2019 1218   WBC 7.2 02/07/2019 1205   RBC 4.09 03/19/2019 1218   RBC 3.91 02/07/2019 1205   HGB 11.0 (L) 03/19/2019 1218   HCT 35.7 03/19/2019 1218   PLT 260 03/19/2019 1218   MCV 87 03/19/2019 1218   MCH 26.9 03/19/2019 1218   MCH 27.6 02/07/2019 1205   MCHC 30.8 (L) 03/19/2019 1218   MCHC 31.3 02/07/2019 1205   RDW 12.7 03/19/2019 1218  LYMPHSABS 3.4 (H) 11/30/2018 1346   MONOABS 0.8 10/28/2018 1023   EOSABS 0.2 11/30/2018 1346   BASOSABS 0.1 11/30/2018 1346    ASSESSMENT AND PLAN: 1. Uncontrolled type 2 diabetes mellitus with peripheral neuropathy (HCC) Dietary counseling given. Increase Levemir from 20 units twice a day to 26 units twice a day.  Increase NovoLog to 14 units with meals.  Continue Victoza. Advised to keep a log and write down her blood sugar readings and bring those in in 1 month to meet with our clinical pharmacist - POCT glucose (manual entry) - insulin detemir (LEVEMIR) 100 UNIT/ML injection; Inject 26 units [place] BID.  Dispense: 10 mL; Refill: 2 - insulin aspart (NOVOLOG FLEXPEN) 100 UNIT/ML FlexPen; Inject 14 units three times daily before each meal  Dispense: 15 mL; Refill: 0  2. OSA treated with BiPAP Encourage BiPAP use  3. Chronic combined systolic and diastolic congestive heart failure, NYHA class 2; EF 30-35% Followed by cardiology. Plan for ICD placement next  month     Patient was given the opportunity to ask questions.  Patient verbalized understanding of the plan and was able to repeat key elements of the plan.   Orders Placed This Encounter  Procedures  . POCT glucose (manual entry)     Requested Prescriptions    No prescriptions requested or ordered in this encounter    No follow-ups on file.  Karle Plumber, MD, FACP

## 2019-03-30 NOTE — Telephone Encounter (Signed)
CSW spoke with pt after her CHW appt about getting ride to her heart cath appt next week in the hospital- this appt is 3 business days out so would need to call Medicaid now to set up.  Pt reports she does not have enough minutes to call DSS and wait on hold so request CSW assist with this.  CSW called and attempted to set up transport but it has been too long since pt set up transport so she will need a new assessment completed.  Medicaid worker states they will try to call pt this afternoon so she can still get transport in time for appt on Wednesday.  CSW called pt and informed her she needs to answer her calls so she can complete assessment and go ahead and set up transport after her assessment is complete- pt expressed understanding.  CSW will continue to follow and assist as needed  Jorge Ny, [facility] Desk#: [phone] Cell#: [phone]

## 2019-03-30 NOTE — Patient Instructions (Signed)
Please give patient an appointment with our clinical pharmacist in 1 month for diabetes check.  Increase Levemir to 26 units twice a day. Increase NovoLog to 14 units with meals. Please write down and keep a log of your blood sugar readings before meals and bring them in when you see the clinical pharmacist in 1 month.

## 2019-03-30 NOTE — Telephone Encounter (Signed)
CSW called pt to confirm need for ride to appt this morning- pt confirms.  CSW set taxi ride for [place] will continue to follow and assist as needed  Jorge Ny, [place] [facility] Desk#: [phone] Cell#: [phone]

## 2019-03-31 ENCOUNTER — Other Ambulatory Visit (HOSPITAL_COMMUNITY)
Admission: RE | Admit: 2019-03-31 | Discharge: 2019-03-31 | Disposition: A | Discharge status: Home / Self Care | Payer: Medicaid Other | Source: Ambulatory Visit | Attending: Cardiology | Admitting: Cardiology

## 2019-03-31 DIAGNOSIS — Z20828 Contact with and (suspected) exposure to other viral communicable diseases: Secondary | ICD-10-CM | POA: Diagnosis not present

## 2019-03-31 DIAGNOSIS — Z01812 Encounter for preprocedural laboratory examination: Secondary | ICD-10-CM | POA: Diagnosis present

## 2019-04-01 LAB — NOVEL CORONAVIRUS, NAA (HOSP ORDER, SEND-OUT TO REF LAB; TAT 18-24 HRS): SARS-CoV-2, NAA: NOT DETECTED

## 2019-04-02 NOTE — Progress Notes (Signed)
Patient ID: Tamara Silva, female   DOB: 17-Aug-1960, 58 y.o.   MRN: [id number]          Reason for Appointment: Consultation for Type 2 Diabetes  Referring PCP: Dr. Karle Plumber  Today's office visit was provided via telemedicine using video technique The patient was explained the limitations of evaluation and management by telemedicine and the availability of in person appointments.  The patient understood the limitations and agreed to proceed. Patient also understood that the telehealth visit is billable. . Location of the patient: Patient's home . Location of the provider: Physician office Only the patient and myself were participating in the encounter    History of Present Illness:          Date of diagnosis of type 2 diabetes mellitus: 2000       Background history:  She thinks her blood sugar was over 600 at the time of diagnosis and was started on insulin from the start Also she took Metformin for couple of years but she thinks it was stopped because it did not work Her diabetes history is only available since about 2016 in her chart She has been mostly on Levemir insulin Appears to have had sporadic follow-up with only 4 or 5 A1c results available in the last 4 years  Recent history:   Most recent A1c is 8.8 done on 01/19/2019  INSULIN regimen is: Levemir 26 units twice daily, NovoLog 14 units before meals       Non-insulin hypoglycemic drugs the patient is taking are: Victoza 1.8 mg daily  Current management, blood sugar patterns and problems identified:  She apparently has had persistently high blood sugars for the last few months  Recently seen by PCP and her basal insulin was increased by 6 units and NovoLog by 4 units  She was started on Victoza about 2 months ago; she is taking 1.8 mg but does not feel that it has helped her sugars much  However blood sugars are still mostly high although slightly better yesterday; today she thinks her blood sugar was 400  and does not know why  She is usually not forgetting her insulin doses and usually trying to take her NovoLog before eating  She is usually avoiding fried food, large amounts of starchy foods, sweet drinks and snacks  However has difficulty losing weight          Side effects from medications have been: None     Meal times are: Dinner: 6-7 PM  Typical meal intake: Dinner last night was beef, grains and rice               Exercise:  Only some walking around the house  Glucose monitoring:  done about 2 times a day         Glucometer:  Accu-Chek.       Blood Glucose readings by time of day and averages from meter review:  PREMEAL Breakfast Lunch Dinner Bedtime  Overall   Glucose range:  216-400  170  178-314  155,227 ?  Median:        No postprandial readings available  Dietician visit, most recent: During last hospitalization  Weight history:  Wt Readings from Last 3 Encounters:  03/30/19 282 lb 3.2 oz (128 kg)  03/27/19 278 lb 12.8 oz (126.5 kg)  03/19/19 279 lb 12.8 oz (126.9 kg)    Glycemic control:   Lab Results  Component Value Date   HGBA1C 8.8 (A) 01/19/2019   HGBA1C 7.1 (  H) 06/01/2018   HGBA1C 8.0 (A) 01/23/2018   HGBA1C 8 01/23/2018   HGBA1C 8 (A) 01/23/2018   HGBA1C 8.0 (A) 01/23/2018   Lab Results  Component Value Date   MICROALBUR 150 10/06/2016   LDLCALC 52 02/07/2019   CREATININE 1.48 (H) 03/19/2019   Lab Results  Component Value Date   MICRALBCREAT 30-300 10/06/2016    No results found for: FRUCTOSAMINE  Hospital Outpatient Visit on 03/31/2019  Component Date Value Ref Range Status  . SARS-CoV-2, NAA 03/31/2019 NOT DETECTED  NOT DETECTED Final   Comment: (NOTE) This nucleic acid amplification test was developed and its performance characteristics determined by [employer] and [employer]. Nucleic acid amplification tests include PCR and TMA. This test has not been FDA cleared or approved. This test has been authorized by FDA under an  Emergency Use Authorization (EUA). This test is only authorized for the duration of time the declaration that circumstances exist justifying the authorization of the emergency use of in vitro diagnostic tests for detection of SARS-CoV-2 virus and/or diagnosis of COVID-19 infection under section 564(b)(1) of the Act, 21 U.S.C. 009FGH-8(E) (1), unless the authorization is terminated or revoked sooner. When diagnostic testing is negative, the possibility of a false negative result should be considered in the context of a patient's recent exposures and the presence of clinical signs and symptoms consistent with COVID-19. An individual without symptoms of COVID- 19 and who is not shedding SARS-CoV-2 vi                          rus would expect to have a negative (not detected) result in this assay. Performed At: [facility] [place], [place] [id number] Rush Farmer MD [id number]   . Coronavirus Source 03/31/2019 NASOPHARYNGEAL   Final   Performed at [facility], [place] [address], [place], [place] [address]  Office Visit on 03/30/2019  Component Date Value Ref Range Status  . POC Glucose 03/30/2019 270* 70 - 99 mg/dl Final    Allergies as of 04/03/2019      Reactions   Metolazone Other (See Comments)   Dizziness and falling   Plavix [clopidogrel Bisulfate] Other (See Comments)   High PRU's - non-responder   Ibuprofen Other (See Comments)   Pt states she is not supposed to take ibuprofen because of other meds she is taking   Nsaids Other (See Comments)   "I do not take NSAIDs d/t interfering with other meds"      Medication List       Accurate as of April 02, 2019  8:29 PM. If you have any questions, ask your nurse or doctor.        Accu-Chek Aviva Plus test strip Generic drug: glucose blood USE 1 STRIP TO CHECK BLOOD GLUCOSE 3 TIMES A DAY   acetaminophen 500 MG tablet Commonly known as: TYLENOL Take 500 mg by mouth every 6 (six) hours as  needed for moderate pain or headache.   albuterol 108 (90 Base) MCG/ACT inhaler Commonly known as: ProAir HFA INHALE 2 PUFFS BY MOUTH 4 TIMES DAILY AS NEEDED What changed:   how much to take  how to take this  when to take this  reasons to take this   Anoro Ellipta 62.5-25 MCG/INH Aepb Generic drug: umeclidinium-vilanterol Inhale 1 puff into the lungs daily.   aspirin EC 81 MG tablet Take 1 tablet (81 mg total) by mouth daily.   BiDil 20-37.5 MG tablet Generic  drug: isosorbide-hydrALAZINE Take 1 tablet by mouth 3 (three) times daily.   bisoprolol 5 MG tablet Commonly known as: ZEBETA Take 1 tablet (5 mg total) by mouth daily.   ferrous sulfate 325 (65 FE) MG tablet Take 325 mg by mouth daily with breakfast.   hydrocerin Crea Apply 1 application topically 2 (two) times daily.   insulin detemir 100 UNIT/ML injection Commonly known as: LEVEMIR Inject 26 units [place] BID.   ipratropium-albuterol 0.5-2.5 (3) MG/3ML Soln Commonly known as: DUONEB Use nebulizer to inhale contents of one vial every 6 hours as needed for shortness of breath, wheezing What changed:   how much to take  how to take this  when to take this  reasons to take this  additional instructions   liraglutide 18 MG/3ML Sopn Commonly known as: VICTOZA Inject 1.8 mg into the skin daily.   NEEDLE (DISP) 26 G 26G X 1/2" Misc 1 application by Does not apply route daily.   NovoLOG FlexPen 100 UNIT/ML FlexPen Generic drug: insulin aspart Inject 14 units three times daily before each meal   omeprazole 40 MG capsule Commonly known as: PRILOSEC Take 1 capsule (40 mg total) by mouth daily.   Pazeo 0.7 % Soln Generic drug: Olopatadine HCl Place 1 drop into both eyes daily.   potassium chloride SA 20 MEQ tablet Commonly known as: KLOR-CON TAKE 1 TABLET (20 MEQ TOTAL) BY MOUTH DAILY.   Restasis 0.05 % ophthalmic emulsion Generic drug: cycloSPORINE Place 1 drop into both eyes 2 (two) times  daily.   rosuvastatin 40 MG tablet Commonly known as: CRESTOR TAKE 1 TABLET (40 MG TOTAL) BY MOUTH DAILY AT 6 PM.   spironolactone 25 MG tablet Commonly known as: ALDACTONE Take 1 tablet (25 mg total) by mouth daily.   torsemide 20 MG tablet Commonly known as: DEMADEX Take 3 tablets (60 mg total) by mouth 2 (two) times daily.   vitamin B-12 1000 MCG tablet Commonly known as: CYANOCOBALAMIN Take 1,000 mcg by mouth daily.       Allergies:  Allergies  Allergen Reactions  . Metolazone Other (See Comments)    Dizziness and falling  . Plavix [Clopidogrel Bisulfate] Other (See Comments)    High PRU's - non-responder  . Ibuprofen Other (See Comments)    Pt states she is not supposed to take ibuprofen because of other meds she is taking  . Nsaids Other (See Comments)    "I do not take NSAIDs d/t interfering with other meds"    Past Medical History:  Diagnosis Date  . Asthma   . CAD (coronary artery disease)   . Chronic combined systolic (congestive) and diastolic (congestive) heart failure ([place])   . Chronic iron deficiency anemia   . CVA (cerebral vascular accident) ([place]) 05/2018  . Diabetes mellitus without complication ([place])   . DOE (dyspnea on exertion)   . Edema of both legs   . Hepatic steatosis   . Hypertension   . Pulmonary emboli ([place]) 2014  . Respiratory failure ([place]) 09/2018  . Seizure ([place]) 05/2018    Past Surgical History:  Procedure Laterality Date  . BIOPSY  07/10/2018   Procedure: BIOPSY;  Surgeon: [name], MD;  [facility]: [facility];  Service: Gastroenterology;;  . COLONOSCOPY WITH PROPOFOL N/A 08/03/2013   Procedure: COLONOSCOPY WITH PROPOFOL;  Surgeon: Jeryl Columbia, MD;  Location: [facility];  Service: Endoscopy;  Laterality: N/A;  . COLONOSCOPY WITH PROPOFOL N/A 07/10/2018   Procedure: COLONOSCOPY WITH PROPOFOL;  Surgeon: [name], MD;  Location: [facility];  Service: Gastroenterology;  Laterality: N/A;  . CORONARY ANGIOPLASTY WITH STENT  PLACEMENT     CAD in 2006 x 2 and 2009 2 more- place din REx in [place] and [facility] [facility]  . CORONARY ANGIOPLASTY WITH STENT PLACEMENT  10/07/2013   Xience [place] DES 2.75  mm x 15  mm  . ESOPHAGOGASTRODUODENOSCOPY (EGD) WITH PROPOFOL N/A 08/03/2013   Procedure: ESOPHAGOGASTRODUODENOSCOPY (EGD) WITH PROPOFOL;  Surgeon: Jeryl Columbia, MD;  [facility];  Service: Endoscopy;  Laterality: N/A;  . ESOPHAGOGASTRODUODENOSCOPY (EGD) WITH PROPOFOL N/A 07/10/2018   Procedure: ESOPHAGOGASTRODUODENOSCOPY (EGD) WITH PROPOFOL;  Surgeon: [name], MD;  Location: [facility];  Service: Gastroenterology;  Laterality: N/A;  . LEFT HEART CATHETERIZATION WITH CORONARY ANGIOGRAM N/A 10/07/2013   Procedure: LEFT HEART CATHETERIZATION WITH CORONARY ANGIOGRAM;  Surgeon: Leonie Man, MD;  Location: [facility] CATH LAB;  Service: Cardiovascular;  Laterality: N/A;  . RIGHT HEART CATH N/A 11/06/2018   Procedure: RIGHT HEART CATH;  Surgeon: Larey Dresser, MD;  Location: [facility] CV LAB;  Service: Cardiovascular;  Laterality: N/A;  . RIGHT/LEFT HEART CATH AND CORONARY ANGIOGRAPHY N/A 02/12/2019   Procedure: RIGHT/LEFT HEART CATH AND CORONARY ANGIOGRAPHY;  Surgeon: Larey Dresser, MD;  Location: [facility] LAB;  Service: Cardiovascular;  Laterality: N/A;    Family History  Problem Relation Age of Onset  . Stroke Mother   . Hypertension Mother   . Colon cancer Father   . Diabetes Father   . Heart attack Father   . Sarcoidosis Other   . Breast cancer Paternal Aunt   . Emphysema Brother   . Lung disease Brother        Unknown type, 3 brothers, one with liver and lung disease  . Asthma Paternal Aunt     Social History:  reports that she quit smoking about 10 months ago. Her smoking use included cigarettes. She has a 10.00 pack-year smoking history. She has never used smokeless tobacco. She reports previous drug use. Drug: Cocaine. She reports that she does not drink alcohol.   Review of Systems   Constitutional: Negative for weight loss and weight gain.  HENT: Negative for trouble swallowing.   Eyes: Negative for blurred vision.  Respiratory:       Dyspnea on exertion, relatively better recently  Cardiovascular: Negative for chest pain and leg swelling.  Gastrointestinal: Positive for constipation.  Endocrine: Negative for fatigue.  Genitourinary: Positive for frequency and nocturia.  Musculoskeletal: Negative for back pain.  Skin: Negative for rash.  Neurological: Positive for numbness.  Psychiatric/Behavioral: Negative for depressed mood.   No known history of thyroid disease, no unusual fatigue and has not had follow-up for abnormal TSH  Lab Results  Component Value Date   TSH 5.177 (H) 10/28/2018   TSH 1.816 05/30/2018   TSH 1.050 03/04/2014     Lipid history: Has been treated with Crestor 40 mg daily, has history of CAD    Lab Results  Component Value Date   CHOL 137 02/07/2019   HDL 56 02/07/2019   LDLCALC 52 02/07/2019   TRIG 145 02/07/2019   CHOLHDL 2.4 02/07/2019           Hypertension: Treated with bisoprolol, also CHF has been present CHF treated with spironolactone and torsemide as well as isosorbide/hydralazine  BP Readings from Last 3 Encounters:  03/30/19 131/63  03/27/19 130/72  03/19/19 104/62    Most recent eye exam was in 10/2018 with [facility], unknown status of retinopathy  Most recent foot exam: 8/20  Currently known complications of diabetes: Neuropathy, microalbuminuria  LABS:  Hospital Outpatient Visit on 03/31/2019  Component Date Value Ref Range Status  . SARS-CoV-2, NAA 03/31/2019 NOT DETECTED  NOT DETECTED Final   Comment: (NOTE) This nucleic acid amplification test was developed and its performance characteristics determined by [employer], [employer] and [employer]. Nucleic acid amplification tests include PCR and TMA. This test has not been FDA cleared or approved. This test has been authorized by FDA under an Emergency Use  Authorization (EUA). This test is only authorized for the duration of time the declaration that circumstances exist justifying the authorization of the emergency use of in vitro diagnostic tests for detection of SARS-CoV-2 virus and/or diagnosis of COVID-19 infection under section 564(b)(1) of the Act, 21 U.S.C. 709GGE-3(M) (1), unless the authorization is terminated or revoked sooner. When diagnostic testing is negative, the possibility of a false negative result should be considered in the context of a patient's recent exposures and the presence of clinical signs and symptoms consistent with COVID-19. An individual without symptoms of COVID- 19 and who is not shedding SARS-CoV-2 vi                          rus would expect to have a negative (not detected) result in this assay. Performed At: [facility] [place], [place] [id number] Rush Farmer MD [id number]   . Coronavirus Source 03/31/2019 NASOPHARYNGEAL   Final   Performed at [facility], [place] [facility] [address] [place], [place] [address]  Office Visit on 03/30/2019  Component Date Value Ref Range Status  . POC Glucose 03/30/2019 270* 70 - 99 mg/dl Final    Physical Examination:  LMP 07/28/2013 Comment: perimenopausal  Exam not done, virtual visit   ASSESSMENT:  Diabetes type 2  See history of present illness for detailed discussion of current diabetes management, blood sugar patterns and problems identified  Recent A1c of 8.8 indicates poor control  Current treatment regimen is basal bolus insulin regimen and Victoza She is not getting enough insulin, currently her weight is about 128 kg and likely needs at least 1 unit/kg insulin doses  Has benefited only minimally from adding Victoza Also likely Levemir is as effective as other types of insulins for basal requirement Her fasting blood sugars are fairly consistently high over 200 indicating inadequate basal insulin still However she may  benefit from Metformin her renal function is mildly abnormal and she reportedly did not benefit from this in the past Currently not checking readings and also after meals and mostly before breakfast and dinner At this time she has difficulty losing weight especially with her inability to exercise  Complications of diabetes: Neuropathy, microalbuminuria  History of abnormal TSH in 09/2018, needs follow-up  Other active problems are history of coronary artery disease, cardiomyopathy and COPD  PLAN:    1. Glucose monitoring: . Patient advised to check readings either fasting or 2 hours after meals on a regular basis at least 2-3 times a day and bring monitor for download on each visit  2.  Diabetes education: . Patient will need consultation with dietitian  3.  Lifestyle changes: . Dietary changes: Continue cutting back on carbohydrates and high-fat foods . Exercise regimen: She needs to consider chair exercises, may need to discuss with diabetes educator  4.  Medication changes needed: . Increase Levemir to 35 units twice daily . If she is able to switch to [place] she  can start with 60 units once a day in the evening. . Discussed adjusting the [place] on a weekly basis by 4 units to get the blood sugars down below 140 in the morning . NovoLog will need to be 18 units at dinnertime for now and will need to adjust if blood sugars after meals are still over 180 . If her renal function improves may be also a candidate for an SGLT2 drug  5.  Preventive care needed:  . Urine microalbumin, full eye exam  6.  Follow-up: 3 to 4 weeks    There are no Patient Instructions on file for this visit.   Consultation note has been sent to the referring physician  Counseling time on subjects discussed in assessment and plan sections is over 50% of today's 60 minute visit   Elayne Snare 04/02/2019, 8:29 PM   Note: This office note was prepared with Dragon voice recognition system technology.  Any transcriptional errors that result from this process are unintentional.

## 2019-04-03 ENCOUNTER — Other Ambulatory Visit: Payer: Self-pay

## 2019-04-03 ENCOUNTER — Ambulatory Visit (INDEPENDENT_AMBULATORY_CARE_PROVIDER_SITE_OTHER): Payer: Medicaid Other | Admitting: Endocrinology

## 2019-04-03 ENCOUNTER — Encounter: Payer: Self-pay | Admitting: Endocrinology

## 2019-04-03 DIAGNOSIS — E1165 Type 2 diabetes mellitus with hyperglycemia: Secondary | ICD-10-CM | POA: Diagnosis not present

## 2019-04-03 DIAGNOSIS — R7989 Other specified abnormal findings of blood chemistry: Secondary | ICD-10-CM | POA: Diagnosis not present

## 2019-04-03 DIAGNOSIS — Z794 Long term (current) use of insulin: Secondary | ICD-10-CM | POA: Diagnosis not present

## 2019-04-03 MED ORDER — TRESIBA FLEXTOUCH 200 UNIT/ML ~~LOC~~ SOPN: 60.0000 [IU] | PEN_INJECTOR | Freq: Every day | SUBCUTANEOUS | 1 refills | Status: DC

## 2019-04-03 MED ORDER — DEXTROSE 5 % IV SOLN
3.0000 g | INTRAVENOUS | Status: AC
  Administered 2019-04-04: 3 g via INTRAVENOUS
  Filled 2019-04-03: qty 3

## 2019-04-04 ENCOUNTER — Ambulatory Visit (HOSPITAL_COMMUNITY)
Admission: RE | Admit: 2019-04-04 | Discharge: 2019-04-05 | Disposition: A | Discharge status: Home / Self Care | Payer: Medicaid Other | Attending: Cardiology | Admitting: Cardiology

## 2019-04-04 ENCOUNTER — Encounter (HOSPITAL_COMMUNITY): Payer: Self-pay | Admitting: General Practice

## 2019-04-04 ENCOUNTER — Telehealth: Payer: Self-pay

## 2019-04-04 ENCOUNTER — Telehealth (HOSPITAL_COMMUNITY): Payer: Self-pay

## 2019-04-04 ENCOUNTER — Telehealth (HOSPITAL_COMMUNITY): Payer: Self-pay | Admitting: Licensed Clinical Social Worker

## 2019-04-04 ENCOUNTER — Encounter (HOSPITAL_COMMUNITY)
Admission: RE | Disposition: A | Discharge status: Home / Self Care | Payer: Self-pay | Source: Home / Self Care | Attending: Cardiology

## 2019-04-04 ENCOUNTER — Other Ambulatory Visit: Payer: Self-pay

## 2019-04-04 DIAGNOSIS — Z7982 Long term (current) use of aspirin: Secondary | ICD-10-CM | POA: Insufficient documentation

## 2019-04-04 DIAGNOSIS — I255 Ischemic cardiomyopathy: Secondary | ICD-10-CM

## 2019-04-04 DIAGNOSIS — Z886 Allergy status to analgesic agent status: Secondary | ICD-10-CM | POA: Insufficient documentation

## 2019-04-04 DIAGNOSIS — Z95810 Presence of automatic (implantable) cardiac defibrillator: Secondary | ICD-10-CM

## 2019-04-04 DIAGNOSIS — Z95818 Presence of other cardiac implants and grafts: Secondary | ICD-10-CM

## 2019-04-04 DIAGNOSIS — Z794 Long term (current) use of insulin: Secondary | ICD-10-CM | POA: Diagnosis not present

## 2019-04-04 DIAGNOSIS — Z79899 Other long term (current) drug therapy: Secondary | ICD-10-CM | POA: Diagnosis not present

## 2019-04-04 DIAGNOSIS — Z006 Encounter for examination for normal comparison and control in clinical research program: Secondary | ICD-10-CM | POA: Insufficient documentation

## 2019-04-04 DIAGNOSIS — I5022 Chronic systolic (congestive) heart failure: Secondary | ICD-10-CM | POA: Insufficient documentation

## 2019-04-04 HISTORY — PX: ICD IMPLANT: EP1208

## 2019-04-04 HISTORY — DX: Presence of automatic (implantable) cardiac defibrillator: Z95.810

## 2019-04-04 LAB — GLUCOSE, CAPILLARY
Glucose-Capillary: 201 mg/dL — ABNORMAL HIGH (ref 70–99)
Glucose-Capillary: 158 mg/dL — ABNORMAL HIGH (ref 70–99)
Glucose-Capillary: 251 mg/dL — ABNORMAL HIGH (ref 70–99)
Glucose-Capillary: 198 mg/dL — ABNORMAL HIGH (ref 70–99)

## 2019-04-04 LAB — SURGICAL PCR SCREEN
MRSA, PCR: POSITIVE — AB
Staphylococcus aureus: POSITIVE — AB

## 2019-04-04 SURGERY — ICD IMPLANT

## 2019-04-04 MED ORDER — SPIRONOLACTONE 25 MG PO TABS
25.0000 mg | ORAL_TABLET | Freq: Every day | ORAL | Status: DC
  Administered 2019-04-04 – 2019-04-05 (×2): 25 mg via ORAL
  Filled 2019-04-04 (×2): qty 1

## 2019-04-04 MED ORDER — INSULIN DEGLUDEC 200 UNIT/ML ~~LOC~~ SOPN: 60.0000 [IU] | PEN_INJECTOR | Freq: Every day | SUBCUTANEOUS | Status: DC

## 2019-04-04 MED ORDER — ASPIRIN EC 81 MG PO TBEC
81.0000 mg | DELAYED_RELEASE_TABLET | Freq: Every day | ORAL | Status: DC
  Administered 2019-04-05: 81 mg via ORAL
  Filled 2019-04-04: qty 1

## 2019-04-04 MED ORDER — ISOSORB DINITRATE-HYDRALAZINE 20-37.5 MG PO TABS
1.0000 | ORAL_TABLET | Freq: Three times a day (TID) | ORAL | Status: DC
  Administered 2019-04-04 – 2019-04-05 (×3): 1 via ORAL
  Filled 2019-04-04 (×3): qty 1

## 2019-04-04 MED ORDER — SODIUM CHLORIDE 0.9 % IV SOLN
INTRAVENOUS | Status: AC
  Filled 2019-04-04: qty 2

## 2019-04-04 MED ORDER — HEPARIN (PORCINE) IN NACL 1000-0.9 UT/500ML-% IV SOLN
INTRAVENOUS | Status: DC | PRN
  Administered 2019-04-04: 500 mL

## 2019-04-04 MED ORDER — MUPIROCIN 2 % EX OINT
TOPICAL_OINTMENT | CUTANEOUS | Status: AC
  Administered 2019-04-04: 1 via NASAL
  Filled 2019-04-04: qty 22

## 2019-04-04 MED ORDER — POTASSIUM CHLORIDE CRYS ER 20 MEQ PO TBCR
20.0000 meq | EXTENDED_RELEASE_TABLET | Freq: Every day | ORAL | Status: DC
  Administered 2019-04-04 – 2019-04-05 (×2): 20 meq via ORAL
  Filled 2019-04-04 (×2): qty 1

## 2019-04-04 MED ORDER — HEPARIN (PORCINE) IN NACL 1000-0.9 UT/500ML-% IV SOLN
INTRAVENOUS | Status: AC
  Filled 2019-04-04: qty 500

## 2019-04-04 MED ORDER — ONDANSETRON HCL 4 MG/2ML IJ SOLN: 4.0000 mg | Freq: Four times a day (QID) | INTRAMUSCULAR | Status: DC | PRN

## 2019-04-04 MED ORDER — FERROUS SULFATE 325 (65 FE) MG PO TABS
325.0000 mg | ORAL_TABLET | Freq: Every day | ORAL | Status: DC
  Administered 2019-04-04 – 2019-04-05 (×2): 325 mg via ORAL
  Filled 2019-04-04 (×2): qty 1

## 2019-04-04 MED ORDER — INSULIN GLARGINE 100 UNIT/ML ~~LOC~~ SOLN
60.0000 [IU] | Freq: Every day | SUBCUTANEOUS | Status: DC
  Administered 2019-04-04 – 2019-04-05 (×2): 60 [IU] via SUBCUTANEOUS
  Filled 2019-04-04 (×2): qty 0.6

## 2019-04-04 MED ORDER — UMECLIDINIUM-VILANTEROL 62.5-25 MCG/INH IN AEPB
1.0000 | INHALATION_SPRAY | Freq: Every day | RESPIRATORY_TRACT | Status: DC
  Administered 2019-04-05: 1 via RESPIRATORY_TRACT
  Filled 2019-04-04: qty 14

## 2019-04-04 MED ORDER — SODIUM CHLORIDE 0.9 % IV SOLN
80.0000 mg | INTRAVENOUS | Status: AC
  Administered 2019-04-04: 80 mg

## 2019-04-04 MED ORDER — MIDAZOLAM HCL 5 MG/5ML IJ SOLN
INTRAMUSCULAR | Status: DC | PRN
  Administered 2019-04-04 (×2): 1 mg via INTRAVENOUS

## 2019-04-04 MED ORDER — VITAMIN B-12 1000 MCG PO TABS
1000.0000 ug | ORAL_TABLET | Freq: Every day | ORAL | Status: DC
  Administered 2019-04-04 – 2019-04-05 (×2): 1000 ug via ORAL
  Filled 2019-04-04 (×2): qty 1

## 2019-04-04 MED ORDER — LIDOCAINE HCL (PF) 1 % IJ SOLN
INTRAMUSCULAR | Status: DC | PRN
  Administered 2019-04-04: 45 mL

## 2019-04-04 MED ORDER — FENTANYL CITRATE (PF) 100 MCG/2ML IJ SOLN
INTRAMUSCULAR | Status: AC
  Filled 2019-04-04: qty 2

## 2019-04-04 MED ORDER — LIDOCAINE HCL 1 % IJ SOLN
INTRAMUSCULAR | Status: AC
  Filled 2019-04-04: qty 60

## 2019-04-04 MED ORDER — CHLORHEXIDINE GLUCONATE 4 % EX LIQD
4.0000 "application " | Freq: Once | CUTANEOUS | Status: DC
  Filled 2019-04-04: qty 60

## 2019-04-04 MED ORDER — BISOPROLOL FUMARATE 5 MG PO TABS
5.0000 mg | ORAL_TABLET | Freq: Every day | ORAL | Status: DC
  Administered 2019-04-04 – 2019-04-05 (×2): 5 mg via ORAL
  Filled 2019-04-04 (×2): qty 1

## 2019-04-04 MED ORDER — MIDAZOLAM HCL 5 MG/5ML IJ SOLN
INTRAMUSCULAR | Status: AC
  Filled 2019-04-04: qty 5

## 2019-04-04 MED ORDER — ACETAMINOPHEN 325 MG PO TABS
325.0000 mg | ORAL_TABLET | ORAL | Status: DC | PRN
  Administered 2019-04-05: 325 mg via ORAL
  Filled 2019-04-04: qty 2

## 2019-04-04 MED ORDER — IPRATROPIUM-ALBUTEROL 0.5-2.5 (3) MG/3ML IN SOLN
3.0000 mL | Freq: Four times a day (QID) | RESPIRATORY_TRACT | Status: DC | PRN
  Administered 2019-04-04: 3 mL via RESPIRATORY_TRACT
  Filled 2019-04-04: qty 3

## 2019-04-04 MED ORDER — "NEEDLE (DISP) 26G X 1/2"" MISC": 1.0000 "application " | Freq: Every day | Status: DC

## 2019-04-04 MED ORDER — ACETAMINOPHEN 500 MG PO TABS
500.0000 mg | ORAL_TABLET | Freq: Four times a day (QID) | ORAL | Status: DC | PRN
  Administered 2019-04-04: 500 mg via ORAL
  Filled 2019-04-04: qty 1

## 2019-04-04 MED ORDER — SODIUM CHLORIDE 0.9 % IV SOLN: INTRAVENOUS | Status: DC

## 2019-04-04 MED ORDER — CYCLOSPORINE 0.05 % OP EMUL
1.0000 [drp] | Freq: Two times a day (BID) | OPHTHALMIC | Status: DC
  Administered 2019-04-04 – 2019-04-05 (×2): 1 [drp] via OPHTHALMIC
  Filled 2019-04-04 (×3): qty 30

## 2019-04-04 MED ORDER — ALBUTEROL SULFATE (2.5 MG/3ML) 0.083% IN NEBU: 3.0000 mL | INHALATION_SOLUTION | RESPIRATORY_TRACT | Status: DC | PRN

## 2019-04-04 MED ORDER — CEFAZOLIN SODIUM-DEXTROSE 1-4 GM/50ML-% IV SOLN
1.0000 g | Freq: Four times a day (QID) | INTRAVENOUS | Status: AC
  Administered 2019-04-04 – 2019-04-05 (×3): 1 g via INTRAVENOUS
  Filled 2019-04-04 (×3): qty 50

## 2019-04-04 MED ORDER — FENTANYL CITRATE (PF) 100 MCG/2ML IJ SOLN
INTRAMUSCULAR | Status: DC | PRN
  Administered 2019-04-04 (×2): 25 ug via INTRAVENOUS

## 2019-04-04 MED ORDER — LIRAGLUTIDE 18 MG/3ML ~~LOC~~ SOPN: 1.8000 mg | PEN_INJECTOR | Freq: Every day | SUBCUTANEOUS | Status: DC

## 2019-04-04 MED ORDER — INSULIN ASPART 100 UNIT/ML ~~LOC~~ SOLN
14.0000 [IU] | Freq: Three times a day (TID) | SUBCUTANEOUS | Status: DC
  Administered 2019-04-04 – 2019-04-05 (×2): 14 [IU] via SUBCUTANEOUS

## 2019-04-04 MED ORDER — OLOPATADINE HCL 0.1 % OP SOLN
1.0000 [drp] | Freq: Every day | OPHTHALMIC | Status: DC
  Administered 2019-04-04 – 2019-04-05 (×2): 1 [drp] via OPHTHALMIC
  Filled 2019-04-04: qty 5

## 2019-04-04 MED ORDER — TORSEMIDE 20 MG PO TABS
60.0000 mg | ORAL_TABLET | Freq: Two times a day (BID) | ORAL | Status: DC
  Administered 2019-04-04 – 2019-04-05 (×2): 60 mg via ORAL
  Filled 2019-04-04 (×2): qty 3

## 2019-04-04 MED ORDER — ROSUVASTATIN CALCIUM 20 MG PO TABS
40.0000 mg | ORAL_TABLET | Freq: Every day | ORAL | Status: DC
  Administered 2019-04-04: 40 mg via ORAL
  Filled 2019-04-04: qty 2

## 2019-04-04 MED ORDER — PANTOPRAZOLE SODIUM 40 MG PO TBEC
80.0000 mg | DELAYED_RELEASE_TABLET | Freq: Every day | ORAL | Status: DC
  Administered 2019-04-04 – 2019-04-05 (×2): 80 mg via ORAL
  Filled 2019-04-04 (×2): qty 2

## 2019-04-04 SURGICAL SUPPLY — 7 items
CABLE SURGICAL S-101-97-12 (CABLE) ×2 IMPLANT
ICD VISIA MRI VR DVFB1D4 (ICD Generator) IMPLANT
LEAD SPRINT QUAT SEC 6935M-62 (Lead) ×1 IMPLANT
PAD PRO RADIOLUCENT 2001M-C (PAD) ×2 IMPLANT
SHEATH 9FR PRELUDE SNAP 13 (SHEATH) ×1 IMPLANT
TRAY PACEMAKER INSERTION (PACKS) ×2 IMPLANT
VISIA MRI VR DVFB1D4 (ICD Generator) ×2 IMPLANT

## 2019-04-04 NOTE — Telephone Encounter (Signed)
CSW called pt to ensure she had transportation for her procedure this morning- pt states she had been able to speak with her daughter and confirm that she could give her a ride to the hospital today.  CSW will continue to follow and assist as needed.  Jorge Ny, LCSW Clinical Social Worker [facility] Desk#: [phone] Cell#: [phone]

## 2019-04-04 NOTE — Plan of Care (Signed)
  Problem: Education: Goal: Ability to demonstrate management of disease process will improve 04/04/2019 1605 by Arlina Robes, RN Outcome: Progressing 04/04/2019 1604 by Arlina Robes, RN Outcome: Progressing Goal: Ability to verbalize understanding of medication therapies will improve 04/04/2019 1605 by Arlina Robes, RN Outcome: Progressing 04/04/2019 1604 by Arlina Robes, RN Outcome: Progressing Goal: Individualized Educational Video(s) 04/04/2019 1605 by Arlina Robes, RN Outcome: Progressing 04/04/2019 1604 by Arlina Robes, RN Outcome: Progressing

## 2019-04-04 NOTE — Telephone Encounter (Signed)
-----   Message from Elayne Snare, MD sent at 04/03/2019 10:14 AM EST ----- Regarding: Follow-up appointment Please schedule follow-up appointment preferably with labs before the end of the monthIn office visit required

## 2019-04-04 NOTE — Plan of Care (Signed)
°  Problem: Education: °Goal: Ability to demonstrate management of disease process will improve °Outcome: Progressing °Goal: Ability to verbalize understanding of medication therapies will improve °Outcome: Progressing °Goal: Individualized Educational Video(s) °Outcome: Progressing °  °

## 2019-04-04 NOTE — Telephone Encounter (Signed)
PA initiated via [employer] [place] [employer] call center for [place] U200.  PA was approved from 04/04/2019 through 03/29/2020. PA approval #: [id number] Call Reference #: [id number]

## 2019-04-04 NOTE — H&P (Signed)
ICD Criteria  Current LVEF:30-35%. Within 12 months prior to implant: Yes   Heart failure history: Yes, Class II  Cardiomyopathy history: Yes, Ischemic Cardiomyopathy - Prior MI.  Atrial Fibrillation/Atrial Flutter: No.  Ventricular tachycardia history: No.  Cardiac arrest history: No.  History of syndromes with risk of sudden death: No.  Previous ICD: No.  Current ICD indication: Primary  PPM indication: No.  Class I or II Bradycardia indication present: No  Beta Blocker therapy for 3 or more months: Yes, prescribed.   Ace Inhibitor/ARB therapy for 3 or more months: No, medical reason.   I have seen Tamara Silva is a 58 y.o. femalepre-procedural and has been referred by Aundra Dubin for consideration of ICD implant for primary prevention of sudden death.  The patient's chart has been reviewed and they meet criteria for ICD implant.  I have had a thorough discussion with the patient reviewing options.  The patient and their family (if available) have had opportunities to ask questions and have them answered. The patient and I have decided together through the [place] [employer] to implant ICD at this time.  Risks, benefits, alternatives to ICD implantation were discussed in detail with the patient today. The patient  understands that the risks include but are not limited to bleeding, infection, pneumothorax, perforation, tamponade, vascular damage, renal failure, MI, stroke, death, inappropriate shocks, and lead dislodgement and  wishes to proceed.

## 2019-04-04 NOTE — Telephone Encounter (Signed)
I called Tamara Silva to see how she was doing following her surgery and schedule an appointment for after she gets out of the hospital. Her phone went directly to voicemail so I left a message requesting she call me back when she is able.

## 2019-04-04 NOTE — Telephone Encounter (Signed)
LMTCB to schedule.

## 2019-04-04 NOTE — Progress Notes (Signed)
VAST consulted to place 2 IV's for procedure. Arrived at bedside to discover pt has already gone back for procedure and has needed access.

## 2019-04-04 NOTE — Discharge Instructions (Signed)
After Your ICD (Implantable Cardiac Defibrillator)    You have a Medtronic ICD   Do not lift your arm above shoulder height for 1 week after your procedure. After 7 days, you may progress as below.     Wednesday April 11, 2019  Thursday April 12, 2019 Friday April 13, 2019 Saturday April 14, 2019    Do not lift, push, pull, or carry anything over 10 pounds with the affected arm until 6 weeks (Wednesday May 16, 2019 ) after your procedure.    Do not drive until you have been seen for your wound check, or as long as instructed by your healthcare provider.    Monitor your defibrillator site for redness, swelling, and drainage. Call the device clinic at [phone] if you experience these symptoms or fever/chills.   If your incision is sealed with Steri-strips or staples, you may shower 7 days after your procedure. Do not remove the steri-strips or let the shower hit directly on your site. You may wash around your site with soap and water. If your incision is closed with Dermabond/Surgical glue. You may shower 1 day after your pacemaker implant and wash around the site with soap and water. Avoid lotions, ointments, or perfumes over your incision until it is well-healed.   You may use a hot tub or a pool AFTER your wound check appointment if the incision is completely closed.   Your ICD  may be MRI compatible. This will be discussed at your next office visit/wound check.    Your ICD is designed to protect you from life threatening heart rhythms. Because of this, you may receive a shock.   o 1 shock with no symptoms:  Call the office during business hours. o 1 shock with symptoms (chest pain, chest pressure, dizziness, lightheadedness, shortness of breath, overall feeling unwell):  Call 911. o If you experience 2 or more shocks in 24 hours:  Call 911. o If you receive a shock, you should not drive for 6 months per the [place] DMV IF you receive appropriate therapy from  your ICD.    ICD Alerts:  Some alerts are vibratory and others beep. These are NOT emergencies. Please call our office to let us know. If this occurs at night or on weekends, it can wait until the next business day. Send a remote transmission.   If your device is capable of reading fluid status (for heart failure), you will be offered monthly monitoring to review this with you.    Remote monitoring is used to monitor your ICD from home. This monitoring is scheduled every 91 days by our office. It allows [place] to keep an eye on the functioning of your device to ensure it is working properly. You will routinely see your Electrophysiologist annually (more often if necessary).    Cardioverter Defibrillator Implantation, Care After This sheet gives you information about how to care for yourself after your procedure. Your health care provider may also give you more specific instructions. If you have problems or questions, contact your health care provider. What can I expect after the procedure? After the procedure, it is common to have:  Some pain. It may last a few days.  A slight bump over the skin where the device was placed. Sometimes, it is possible to feel the device under the skin. This is normal.  During the months and years after your procedure, your health care provider will check the device, the leads, and the battery every few months. Eventually,  when the battery is low, the device will be replaced.  You should receive your defibrillator ID card for your new device in the next 4-8 weeks.  Follow these instructions at home: Medicines  Take over-the-counter and prescription medicines only as told by your health care provider.  If you were prescribed an antibiotic medicine, take it as told by your health care provider. Do not stop taking the antibiotic even if you start to feel better. Incision care        Follow instructions from your health care provider about how to take care  of your incision area. Make sure you: ? Leave stitches (sutures), skin glue, or adhesive strips in place. These skin closures may need to stay in place for 2 weeks or longer. If adhesive strip edges start to loosen and curl up, you may trim the loose edges. Do not remove adhesive strips completely unless your health care provider tells you to do that.  Check your incision area every day for signs of infection. Check for: ? More redness, swelling, or pain. ? More fluid or blood. ? Warmth. ? Pus or a bad smell.  Do not use lotions or ointments near the incision area unless told by your health care provider.  Keep the incision area clean and dry for 7 days after the procedure or for as long as told by your health care provider. It takes several weeks for the incision site to heal completely.  Do not take baths, swim, or use a hot tub until your health care provider approves. Activity  Try to walk a little every day. Exercising is important after this procedure. Also, use your shoulder on the side of the defibrillator in daily tasks that do not require a lot of motion.  For at least 1 week: ? Do not lift your upper arm above your shoulders. This means no tennis, golf, or swimming for this period of time. If you tend to sleep with your arm above your head, use a restraint to prevent this during sleep.  For at least 6 weeks: ? Avoid sudden jerking, pulling, or chopping movements that pull your upper arm far away from your body.  Ask your health care provider when you may go back to work.  Check with your health care provider before you start to drive or play sports. Electric and magnetic fields  Tell all health care providers that you have a defibrillator. This may prevent them from giving you an MRI scan because strong magnets are used for that test.  If you must pass through a metal detector, quickly walk through it. Do not stop under the detector, and do not stand near it.  Avoid places  or objects that have a strong electric or magnetic field, including: ? [employer]. At the airport, let officials know that you have a defibrillator. Your defibrillator ID card will let you be checked in a way that is safe for you and will not damage your defibrillator. Also, do not let a security person wave a magnetic wand near your defibrillator. That can make it stop working. ? Power plants. ? Large electrical generators. ? Anti-theft systems or electronic article surveillance (EAS). ? Radiofrequency transmission towers, such as cell phone and radio towers.  Do not use amateur (ham) radio equipment or electric (arc) welding torches. Some devices are safe to use if held at least 12 inches (30 cm) from your defibrillator. These include power tools, lawn mowers, and speakers. If you are  unsure if something is safe to use, ask your health care provider.  Do not use MP3 player headphones. They have magnets.  You may safely use electric blankets, heating pads, computers, and microwave ovens.  When using your cell phone, hold it to the ear that is on the opposite side from the defibrillator. Do not leave your cell phone in a pocket over the defibrillator. General instructions  Follow diet instructions from your health care provider, if this applies.  Always keep your defibrillator ID card with you. The card should list the implant date, device model, and manufacturer. Consider wearing a medical alert bracelet or necklace.  Have your defibrillator checked every 3-6 months or as often as told by your health care provider. Most defibrillators last for 4-8 years.  Keep all follow-up visits as told by your health care provider. This is important for your health care provider to make sure your chest is healing the way it should. Ask your health care provider when you should come back to have your stitches or staples taken out. Contact a health care provider if:  You gain weight  suddenly.  Your legs or feet swell more than they have before.  It feels like your heart is fluttering or skipping beats (heart palpitations).  You have more redness, swelling, or pain around your incision.  You have more fluid or blood coming from your incision.  Your incision feels warm to the touch.  You have pus or a bad smell coming from your incision.  You have a fever. Get help right away if:  You have chest pain.  You feel more than one shock.  You feel more short of breath than you have felt before.  You feel more light-headed than you have felt before.  Your incision starts to open up. This information is not intended to replace advice given to you by your health care provider. Make sure you discuss any questions you have with your health care provider.

## 2019-04-04 NOTE — Discharge Summary (Addendum)
ELECTROPHYSIOLOGY PROCEDURE DISCHARGE SUMMARY    Patient ID: Tamara Silva,  MRN: [id number], DOB/AGE: November 28, 1960 58 y.o.  Admit date: 04/04/2019 Discharge date: 04/05/19   Primary Care Physician: Ladell Pier, MD  Primary Cardiologist: Quay Burow, MD  Electrophysiologist: Constance Haw, MD   Primary Diagnosis:  Chronic systolic CHF   Secondary Diagnosis: ICM  Allergies  Allergen Reactions  . Metolazone Other (See Comments)    Dizziness and falling  . Plavix [Clopidogrel Bisulfate] Other (See Comments)    High PRU's - non-responder  . Ibuprofen Other (See Comments)    Pt states she is not supposed to take ibuprofen because of other meds she is taking  . Nsaids Other (See Comments)    "I do not take NSAIDs d/t interfering with other meds"     Procedures This Admission:  1.  Implantation of a Medtronic single chamber ICD on 04/04/2019 by Dr. Curt Bears.  The patient received a Medtronic model number [id number] ICD with model number [id number] right ventricular lead.  DFT's were deferred at time of implant.  There were no immediate post procedure complications. 2.  CXR on 04/05/19 demonstrated no pneumothorax status post device implantation.  Brief HPI: Tamara Silva is a 58 y.o. female was referred to electrophysiology in the outpatient setting for consideration of ICD implantation.  Past medical history includes above.  The patient has persistent LV dysfunction despite guideline directed therapy.  Risks, benefits, and alternatives to ICD implantation were reviewed with the patient who wished to proceed.   Hospital Course:  The patient was admitted and underwent implantation of a Medtronic single chamber ICD with details as outlined above. They were monitored on telemetry overnight which demonstrated NSR, 1 episode of "NSVT" that appears to be noise.  Left chest was without hematoma or ecchymosis.  The device was interrogated and found to be functioning normally.   CXR was obtained and demonstrated no pneumothorax status post device implantation..  Wound care, arm mobility, and restrictions were reviewed with the patient.  The patient was examined and considered stable for discharge to home.   The patient's discharge medications include a beta blocker (bisoprolol). Pt is NOT currently on ACEI/ARB/ARNI with recent AKI and CKD.  She is on Bidil and spiro.    Physical Exam: Vitals:   04/04/19 2320 04/05/19 0121 04/05/19 0507 04/05/19 0629  BP: (!) 107/52  (!) 105/59   Pulse: 68 78 68   Resp: 20 20 20    Temp: 98.4 F (36.9 C)  97.9 F (36.6 C)   TempSrc: Oral  Oral   SpO2: 100% 98% 95%   Weight:    125.9 kg  Height:        GEN- The patient is well appearing, alert and oriented x 3 today.   HEENT: normocephalic, atraumatic; sclera clear, conjunctiva pink; hearing intact; oropharynx clear; neck supple, no JVP Lymph- no cervical lymphadenopathy Lungs- Clear to ausculation bilaterally, normal work of breathing.  No wheezes, rales, rhonchi Heart- Regular rate and rhythm, no murmurs, rubs or gallops, PMI not laterally displaced GI- soft, non-tender, non-distended, bowel sounds present, no hepatosplenomegaly Extremities- no clubbing, cyanosis, or edema; DP/PT/radial pulses 2+ bilaterally MS- no significant deformity or atrophy Skin- warm and dry, no rash or lesion. ICD site stable Psych- euthymic mood, full affect Neuro- strength and sensation are intact   Labs:   Lab Results  Component Value Date   WBC 8.1 03/19/2019   HGB 11.0 (L) 03/19/2019   HCT 35.7 03/19/2019  MCV 87 03/19/2019   PLT 260 03/19/2019   No results for input(s): NA, K, CL, CO2, BUN, CREATININE, CALCIUM, PROT, BILITOT, ALKPHOS, ALT, AST, GLUCOSE in the last 168 hours.  Invalid input(s): LABALBU  Discharge Medications:  Allergies as of 04/05/2019      Reactions   Metolazone Other (See Comments)   Dizziness and falling   Plavix [clopidogrel Bisulfate] Other (See Comments)    High PRU's - non-responder   Ibuprofen Other (See Comments)   Pt states she is not supposed to take ibuprofen because of other meds she is taking   Nsaids Other (See Comments)   "I do not take NSAIDs d/t interfering with other meds"      Medication List    TAKE these medications   Accu-Chek Aviva Plus test strip Generic drug: glucose blood USE 1 STRIP TO CHECK BLOOD GLUCOSE 3 TIMES A DAY   acetaminophen 500 MG tablet Commonly known as: TYLENOL Take 500 mg by mouth every 6 (six) hours as needed for moderate pain or headache.   albuterol 108 (90 Base) MCG/ACT inhaler Commonly known as: ProAir HFA INHALE 2 PUFFS BY MOUTH 4 TIMES DAILY AS NEEDED What changed:   how much to take  how to take this  when to take this  reasons to take this   Anoro Ellipta 62.5-25 MCG/INH Aepb Generic drug: umeclidinium-vilanterol Inhale 1 puff into the lungs daily.   aspirin EC 81 MG tablet Take 1 tablet (81 mg total) by mouth daily.   BiDil 20-37.5 MG tablet Generic drug: isosorbide-hydrALAZINE Take 1 tablet by mouth 3 (three) times daily.   bisoprolol 5 MG tablet Commonly known as: ZEBETA Take 1 tablet (5 mg total) by mouth daily.   ferrous sulfate 325 (65 FE) MG tablet Take 325 mg by mouth daily with breakfast.   hydrocerin Crea Apply 1 application topically 2 (two) times daily.   insulin detemir 100 UNIT/ML injection Commonly known as: LEVEMIR Inject 26 units [place] BID.   ipratropium-albuterol 0.5-2.5 (3) MG/3ML Soln Commonly known as: DUONEB Use nebulizer to inhale contents of one vial every 6 hours as needed for shortness of breath, wheezing What changed:   how much to take  how to take this  when to take this  reasons to take this  additional instructions   liraglutide 18 MG/3ML Sopn Commonly known as: VICTOZA Inject 1.8 mg into the skin daily.   NEEDLE (DISP) 26 G 26G X 1/2" Misc 1 application by Does not apply route daily.   NovoLOG FlexPen 100 UNIT/ML  FlexPen Generic drug: insulin aspart Inject 14 units three times daily before each meal   omeprazole 40 MG capsule Commonly known as: PRILOSEC Take 1 capsule (40 mg total) by mouth daily.   Pazeo 0.7 % Soln Generic drug: Olopatadine HCl Place 1 drop into both eyes daily.   potassium chloride SA 20 MEQ tablet Commonly known as: KLOR-CON TAKE 1 TABLET (20 MEQ TOTAL) BY MOUTH DAILY.   Restasis 0.05 % ophthalmic emulsion Generic drug: cycloSPORINE Place 1 drop into both eyes 2 (two) times daily.   rosuvastatin 40 MG tablet Commonly known as: CRESTOR TAKE 1 TABLET (40 MG TOTAL) BY MOUTH DAILY AT 6 PM.   spironolactone 25 MG tablet Commonly known as: ALDACTONE Take 1 tablet (25 mg total) by mouth daily.   torsemide 20 MG tablet Commonly known as: DEMADEX Take 3 tablets (60 mg total) by mouth 2 (two) times daily.   Tyler Aas FlexTouch 200 UNIT/ML Sopn Generic drug:  Insulin Degludec Inject 60 Units into the skin daily.   vitamin B-12 1000 MCG tablet Commonly known as: CYANOCOBALAMIN Take 1,000 mcg by mouth daily.       Disposition:   Follow-up Information    Constance Haw, MD Follow up on 07/10/2019.   Specialty: Cardiology Why: at 230 pm for 3 month ICD check Contact information: [address] [place] [address] [phone]        [facility] Follow up on 04/17/2019.   Why: at 1030 for post ICD  wound check Contact information: [place] [address] [phone]          Duration of Discharge Encounter: Greater than 30 minutes including physician time.  Signed, Shirley Friar, PA-C  04/05/2019 8:12 AM   I have seen and examined this patient with Oda Kilts.  Agree with above, note added to reflect my findings.  On exam, RRR, no murmurs, lungs clear.  She is now status post Medtronic ICD for CHF.  Device functioning appropriately.   Chest x-ray and interrogation without issue.  Plan for discharge today with follow-up in device clinic.  Dalinda Heidt M. Azyiah Bo MD 04/05/2019 6:05 PM

## 2019-04-05 ENCOUNTER — Ambulatory Visit (HOSPITAL_COMMUNITY): Payer: Medicaid Other

## 2019-04-05 ENCOUNTER — Other Ambulatory Visit (HOSPITAL_COMMUNITY): Payer: Self-pay

## 2019-04-05 ENCOUNTER — Encounter (HOSPITAL_COMMUNITY): Payer: Self-pay | Admitting: Cardiology

## 2019-04-05 DIAGNOSIS — I255 Ischemic cardiomyopathy: Secondary | ICD-10-CM | POA: Diagnosis not present

## 2019-04-05 DIAGNOSIS — Z006 Encounter for examination for normal comparison and control in clinical research program: Secondary | ICD-10-CM | POA: Diagnosis not present

## 2019-04-05 DIAGNOSIS — Z7982 Long term (current) use of aspirin: Secondary | ICD-10-CM | POA: Diagnosis not present

## 2019-04-05 DIAGNOSIS — I5022 Chronic systolic (congestive) heart failure: Secondary | ICD-10-CM | POA: Diagnosis not present

## 2019-04-05 LAB — GLUCOSE, CAPILLARY: Glucose-Capillary: 144 mg/dL — ABNORMAL HIGH (ref 70–99)

## 2019-04-05 MED FILL — Lidocaine HCl Local Inj 1%: INTRAMUSCULAR | Qty: 60 | Status: AC

## 2019-04-05 NOTE — Progress Notes (Signed)
Patient alert  Oriented iv and tele d/c. D/c instruction explain and given to the patient all questions answered. Pt. D/c home per order

## 2019-04-05 NOTE — Progress Notes (Signed)
Patient did not want to stand for wait this early in the morning. Will attempt again.

## 2019-04-05 NOTE — Plan of Care (Signed)
  Problem: Education: Goal: Ability to demonstrate management of disease process will improve Outcome: Progressing   Problem: Activity: Goal: Capacity to carry out activities will improve Outcome: Progressing   

## 2019-04-05 NOTE — Progress Notes (Signed)
RT placed pt on BIPAP dream station for the night. Pt settings 20/8 w/5Lpm bled into the system. Pt respiratory status is stable at this time. RT will continue to monitor.

## 2019-04-05 NOTE — Progress Notes (Signed)
I saw [name] at home today to deliver her medication from the pharmacy. She did not need anything else at this time. Since she was just discharged from the hospital today, I advised that we would wait until next week for a full visit. She was understanding and agreeable.   Tamara Silva, EMT 04/05/19

## 2019-04-06 ENCOUNTER — Telehealth: Payer: Self-pay | Admitting: Endocrinology

## 2019-04-06 NOTE — Telephone Encounter (Signed)
Patient is scheduled for in person appointment on 05/07/19.

## 2019-04-06 NOTE — Telephone Encounter (Signed)
-----   Message from Elayne Snare, MD sent at 04/03/2019 10:14 AM EST ----- Regarding: Follow-up appointment Please schedule follow-up appointment preferably with labs before the end of the monthIn office visit required

## 2019-04-06 NOTE — Telephone Encounter (Signed)
Patient requests to have labs done at PCP-Dr. Neoma Laming Johnson's office and requests that any labs Dr. Dwyane Dee wants done be sent to Dr. Wynetta Emery so that patient can get labs done all at once. Patient does not know fax#.

## 2019-04-07 ENCOUNTER — Other Ambulatory Visit: Payer: Self-pay | Admitting: Family Medicine

## 2019-04-07 DIAGNOSIS — K219 Gastro-esophageal reflux disease without esophagitis: Secondary | ICD-10-CM

## 2019-04-09 ENCOUNTER — Other Ambulatory Visit: Payer: Self-pay | Admitting: Family Medicine

## 2019-04-09 ENCOUNTER — Other Ambulatory Visit: Payer: Self-pay | Admitting: Internal Medicine

## 2019-04-09 DIAGNOSIS — IMO0002 Reserved for concepts with insufficient information to code with codable children: Secondary | ICD-10-CM

## 2019-04-09 DIAGNOSIS — E1142 Type 2 diabetes mellitus with diabetic polyneuropathy: Secondary | ICD-10-CM

## 2019-04-09 DIAGNOSIS — I5042 Chronic combined systolic (congestive) and diastolic (congestive) heart failure: Secondary | ICD-10-CM

## 2019-04-11 ENCOUNTER — Other Ambulatory Visit (HOSPITAL_COMMUNITY): Payer: Self-pay

## 2019-04-11 NOTE — Progress Notes (Signed)
Paramedicine Encounter    Patient ID: Tamara Silva, female    DOB: 06/26/1960, 58 y.o.   MRN: [id number]   Patient Care Team: Ladell Pier, MD as PCP - General (Internal Medicine) Lorretta Harp, MD as PCP - Cardiology (Cardiology) Larey Dresser, MD as PCP - Advanced Heart Failure (Cardiology) Constance Haw, MD as PCP - Electrophysiology (Cardiology)  Patient Active Problem List   Diagnosis Date Noted  . Chronic systolic (congestive) heart failure ([place]) 04/04/2019  . Diabetic peripheral neuropathy ([place]) 01/19/2019  . Former smoker 01/19/2019  . CKD (chronic kidney disease) stage 3, GFR 30-59 ml/min 01/19/2019  . Cardiac arrest ([place]) 10/28/2018  . Seizure ([place]) 10/28/2018  . Noncompliance with treatment plan 07/24/2018  . General patient noncompliance 07/24/2018  . Noncompliance with diet and medication regimen 07/24/2018  . Dysarthria   . BiPAP (biphasic positive airway pressure) dependence   . Pancytopenia ([place]) 07/08/2018  . Type II diabetes mellitus with renal manifestations ([place]) 07/08/2018  . Hepatic steatosis 06/22/2018  . Chronic respiratory failure with hypoxia and hypercapnia (HCC)   . On home O2   . Slow transit constipation   . Cocaine abuse ([place])   . Diabetes mellitus type 2 in obese ([place])   . Generalized tonic-clonic seizure ([place]) 06/05/2018  . Cerebral embolism with cerebral infarction 06/01/2018  . Pressure injury of skin 05/30/2018  . Iron deficiency anemia 01/27/2018  . Tobacco abuse disorder 10/03/2017  . Dyslipidemia 04/29/2016  . Primary insomnia 04/29/2016  . Obesity hypoventilation syndrome ([place])   . OSA (obstructive sleep apnea)   . Dyslipidemia associated with type 2 diabetes mellitus ([place]) 05/21/2015  . COPD GOLD III  06/10/2014  . CAD S/P percutaneous coronary angioplasty - prior PCI to LAD; RCA PCI: new Xience Alpine DES 2.75 mm x 15 mm  10/07/2013  . Morbid obesity ([place]) 07/30/2013  . Chronic combined systolic and diastolic  heart failure ([place]) 07/10/2013  . Chronic pulmonary embolism ([place]) 02/08/2013  . Bipolar disorder ([place]) 02/08/2013  . Essential hypertension 02/08/2013  . COPD (chronic obstructive pulmonary disease) ([place]) 10/15/2012  . Pulmonary embolism on right ([place]) 10/15/2012    Current Outpatient Medications:  .  ACCU-CHEK AVIVA PLUS test strip, USE 1 STRIP TO CHECK BLOOD GLUCOSE 3 TIMES A DAY, Disp: 100 each, Rfl: 2 .  acetaminophen (TYLENOL) 500 MG tablet, Take 500 mg by mouth every 6 (six) hours as needed for moderate pain or headache., Disp: , Rfl:  .  albuterol (PROAIR HFA) 108 (90 Base) MCG/ACT inhaler, INHALE 2 PUFFS BY MOUTH 4 TIMES DAILY AS NEEDED (Patient taking differently: Inhale 2 puffs into the lungs every 4 (four) hours as needed. INHALE 2 PUFFS BY MOUTH 4 TIMES DAILY AS NEEDED), Disp: 18 g, Rfl: 11 .  aspirin EC 81 MG tablet, Take 1 tablet (81 mg total) by mouth daily., Disp: 90 tablet, Rfl: 3 .  bisoprolol (ZEBETA) 5 MG tablet, Take 1 tablet (5 mg total) by mouth daily., Disp: 30 tablet, Rfl: 6 .  ferrous sulfate 325 (65 FE) MG tablet, Take 325 mg by mouth daily with breakfast., Disp: , Rfl:  .  Insulin Degludec (TRESIBA FLEXTOUCH) 200 UNIT/ML SOPN, Inject 60 Units into the skin daily., Disp: 3 pen, Rfl: 1 .  ipratropium-albuterol (DUONEB) 0.5-2.5 (3) MG/3ML SOLN, Use nebulizer to inhale contents of one vial every 6 hours as needed for shortness of breath, wheezing (Patient taking differently: Inhale 3 mLs into the lungs every 6 (six) hours as needed (shortness  of breath and wheezing). ), Disp: 360 mL, Rfl: 11 .  isosorbide-hydrALAZINE (BIDIL) 20-37.5 MG tablet, Take 1 tablet by mouth 3 (three) times daily., Disp: 90 tablet, Rfl: 6 .  liraglutide (VICTOZA) 18 MG/3ML SOPN, Inject 1.8 mg into the skin daily., Disp: 9 mL, Rfl: 2 .  omeprazole (PRILOSEC) 40 MG capsule, TAKE 1 CAPSULE (40 MG TOTAL) BY MOUTH DAILY., Disp: 30 capsule, Rfl: 2 .  PAZEO 0.7 % SOLN, Place 1 drop into both eyes daily.  , Disp: , Rfl:  .  potassium chloride SA (KLOR-CON) 20 MEQ tablet, TAKE 1 TABLET (20 MEQ TOTAL) BY MOUTH DAILY., Disp: 30 tablet, Rfl: 3 .  RESTASIS 0.05 % ophthalmic emulsion, Place 1 drop into both eyes 2 (two) times daily. , Disp: , Rfl:  .  rosuvastatin (CRESTOR) 40 MG tablet, TAKE 1 TABLET (40 MG TOTAL) BY MOUTH DAILY AT 6 PM., Disp: 30 tablet, Rfl: 3 .  spironolactone (ALDACTONE) 25 MG tablet, TAKE 1 TABLET (25 MG TOTAL) BY MOUTH DAILY., Disp: 90 tablet, Rfl: 1 .  torsemide (DEMADEX) 20 MG tablet, Take 3 tablets (60 mg total) by mouth 2 (two) times daily., Disp: 150 tablet, Rfl: 3 .  vitamin B-12 (CYANOCOBALAMIN) 1000 MCG tablet, Take 1,000 mcg by mouth daily., Disp: , Rfl:  .  hydrocerin (EUCERIN) CREA, Apply 1 application topically 2 (two) times daily. (Patient not taking: Reported on 04/11/2019), Disp: , Rfl: 0 .  insulin detemir (LEVEMIR) 100 UNIT/ML injection, Inject 26 units  BID. (Patient not taking: Reported on 04/11/2019), Disp: 10 mL, Rfl: 2 .  NEEDLE, DISP, 26 G 26G X 1/2" MISC, 1 application by Does not apply route daily., Disp: 100 each, Rfl: 0 .  NOVOLOG FLEXPEN 100 UNIT/ML FlexPen, INJECT 18 UNITS BEFORE dinner as instructed by Dr. Dwyane Dee. (Patient not taking: Reported on 04/11/2019), Disp: 15 mL, Rfl: 0 .  umeclidinium-vilanterol (ANORO ELLIPTA) 62.5-25 MCG/INH AEPB, Inhale 1 puff into the lungs daily. (Patient not taking: Reported on 04/11/2019), Disp: 1 each, Rfl: 5 Allergies  Allergen Reactions  . Metolazone Other (See Comments)    Dizziness and falling  . Plavix [Clopidogrel Bisulfate] Other (See Comments)    High PRU's - non-responder  . Ibuprofen Other (See Comments)    Pt states she is not supposed to take ibuprofen because of other meds she is taking  . Nsaids Other (See Comments)    "I do not take NSAIDs d/t interfering with other meds"      Social History   Socioeconomic History  . Marital status: Single    Spouse name: Not on file  . Number of  children: 1  . Years of education: Not on file  . Highest education level: Not on file  Occupational History  . Not on file  Social Needs  . Financial resource strain: Somewhat hard  . Food insecurity    Worry: Sometimes true    Inability: Sometimes true  . Transportation needs    Medical: Yes    Non-medical: Yes  Tobacco Use  . Smoking status: Former Smoker    Packs/day: 0.50    Years: 20.00    Pack years: 10.00    Types: Cigarettes    Quit date: 05/31/2018    Years since quitting: 0.8  . Smokeless tobacco: Never Used  . Tobacco comment: smoked off and on x 20 years  Substance and Sexual Activity  . Alcohol use: No    Alcohol/week: 0.0 standard drinks  . Drug use: Not Currently  Types: Cocaine  . Sexual activity: Not Currently  Lifestyle  . Physical activity    Days per week: 0 days    Minutes per session: 0 min  . Stress: Rather much  Relationships  . Social Herbalist on phone: Twice a week    Gets together: Once a week    Attends religious service: 1 to 4 times per year    Active member of club or organization: No    Attends meetings of clubs or organizations: Never    Relationship status: Living with partner  . Intimate partner violence    Fear of current or ex partner: Patient refused    Emotionally abused: Patient refused    Physically abused: Patient refused    Forced sexual activity: Patient refused  Other Topics Concern  . Not on file  Social History Narrative   Origibnally from [place]   Most recently from [place]   Daughter lives in town      On Social security to CHF, COPD, CAD    Physical Exam Cardiovascular:     Rate and Rhythm: Normal rate and regular rhythm.     Pulses: Normal pulses.  Pulmonary:     Effort: Pulmonary effort is normal.     Breath sounds: Normal breath sounds.  Musculoskeletal: Normal range of motion.     Right lower leg: No edema.  Skin:    General: Skin is warm and dry.     Capillary Refill: Capillary  refill takes less than 2 seconds.  Neurological:     Mental Status: She is alert and oriented to person, place, and time.  Psychiatric:        Mood and Affect: Mood normal.         Future Appointments  Date Time Provider [place]  04/17/2019 10:30 AM [facility] DEVICE 1 [facility]  04/30/2019 11:00 AM Tresa Endo, RPH-CPP [facility] None  05/04/2019  1:00 PM Sueanne Margarita, MD [facility]  05/07/2019 10:45 AM Elayne Snare, MD [facility] None  05/28/2019 11:40 AM Larey Dresser, MD [facility] None  05/29/2019  2:30 PM Laurin Coder, MD [facility] None  07/02/2019 11:10 AM Ladell Pier, MD [facility] None  07/05/2019  7:05 AM [facility] DEVICE REMOTES [facility]  07/10/2019  2:30 PM Camnitz, Ocie Doyne, MD [facility]    BP 116/60 (BP Location: Left Arm, Patient Position: Sitting, Cuff Size: Large)   Pulse 80   Resp 16   Wt 288 lb (130.6 kg)   LMP 07/28/2013 Comment: perimenopausal  SpO2 94%   BMI 44.44 kg/m   Weight yesterday- did not weigh Last visit weight- 277 lb  Ms Markovic was seen at home today and reported feeling well. She denied chest pain, SOB, headache, dizziness, orthopnea, fever or cough since our last visit. She stated she has been compliant with her medications over the past week and her weight has been stable. Her medications were verified. She is still waiting on PA for Anoro Ellipta. I contacted the pharmacy and asked that they resend the PA request to the doctors office. Ms Prophete also asked that I return a phone that she had bought from [employer] because the charger it came with did not work. I advised I would do it this afternoon. She also asked that I look for another phone for her. I will see what I am able to find and return this afternoon.  Jacquiline Doe, EMT 04/11/19  ACTION:  Home visit completed Next visit planned for 1 week

## 2019-04-16 IMAGING — DX DG CHEST 1V PORT
1 series · 1 of 1 positions shown · non-contrast
Comparison: Portable exam 0989 hours compared to 06/26/2018

CLINICAL DATA: Mild dyspnea, began feeling cold 1 hour ago, a
typical feeling for exacerbation of her anemia, chronic cough
shortness of breath, history coronary artery disease, CHF, diabetes
mellitus, hypertension, former smoker

EXAM:
PORTABLE CHEST 1 VIEW

[chest ap]
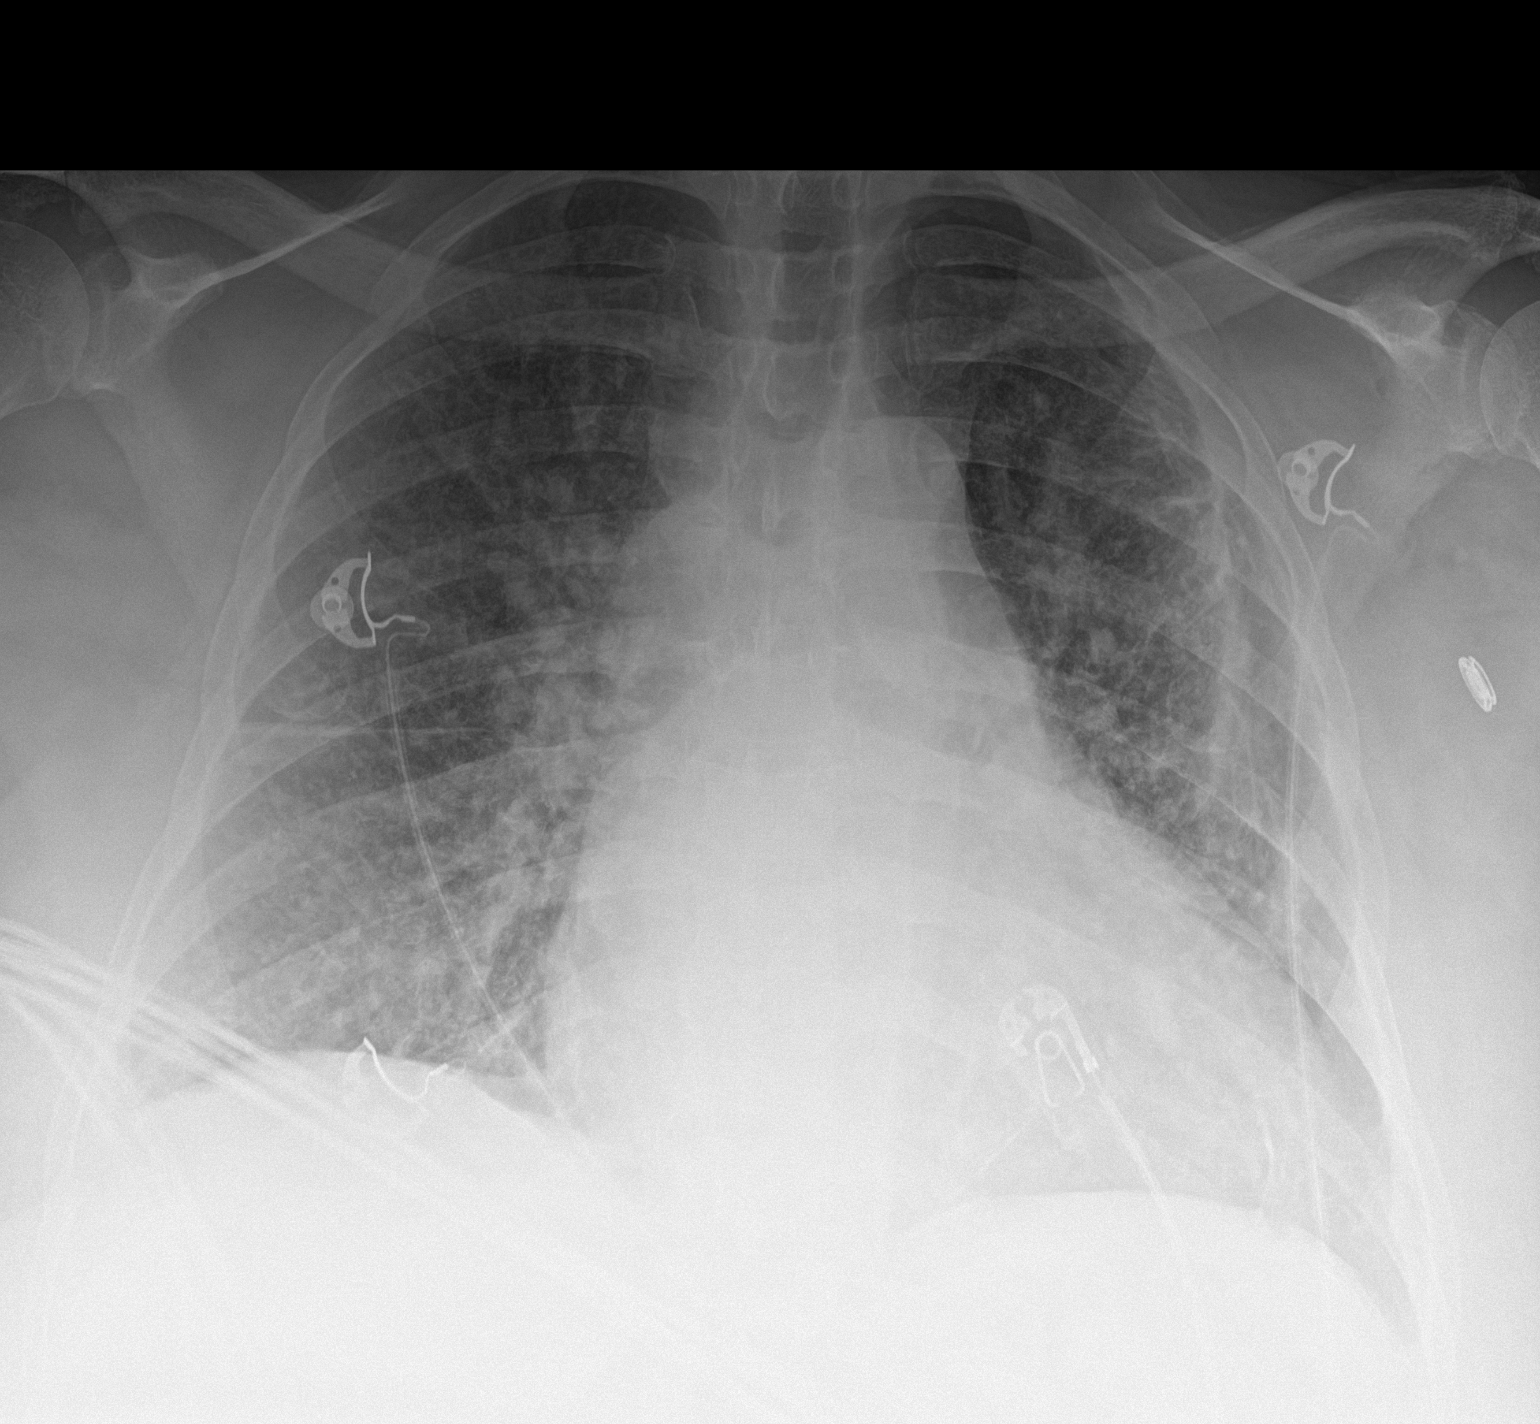

[1 of 1 positions shown; findings below may reference images not displayed]

FINDINGS: Enlargement of cardiac silhouette, chronic.

Pulmonary vascular congestion.

Mediastinal contours normal.

Pleural calcification and scarring in the LEFT hemithorax unchanged.

Mild diffuse BILATERAL interstitial infiltrates consistent with
pulmonary edema.

No pleural effusion or pneumothorax.

Bones unremarkable.
IMPRESSION: Enlargement of cardiac silhouette with pulmonary vascular congestion
and mild pulmonary edema.

Chronic scarring and pleural calcification of the LEFT hemithorax
unchanged.

## 2019-04-17 ENCOUNTER — Telehealth (HOSPITAL_COMMUNITY): Payer: Self-pay

## 2019-04-17 ENCOUNTER — Ambulatory Visit (INDEPENDENT_AMBULATORY_CARE_PROVIDER_SITE_OTHER): Payer: Medicaid Other | Admitting: Student

## 2019-04-17 ENCOUNTER — Other Ambulatory Visit: Payer: Self-pay

## 2019-04-17 DIAGNOSIS — I5042 Chronic combined systolic (congestive) and diastolic (congestive) heart failure: Secondary | ICD-10-CM | POA: Diagnosis not present

## 2019-04-17 LAB — CUP PACEART INCLINIC DEVICE CHECK
Battery Remaining Longevity: 137 mo
Battery Voltage: 3.15 V
Brady Statistic RV Percent Paced: 0.01 %
Date Time Interrogation Session: 20201117110359
HighPow Impedance: 56 Ohm
Implantable Lead Implant Date: 20201104
Implantable Lead Location: 753860
Implantable Pulse Generator Implant Date: 20201104
Lead Channel Impedance Value: 323 Ohm
Lead Channel Impedance Value: 399 Ohm
Lead Channel Pacing Threshold Amplitude: 0.5 V
Lead Channel Pacing Threshold Pulse Width: 0.4 ms
Lead Channel Sensing Intrinsic Amplitude: 14.875 mV
Lead Channel Sensing Intrinsic Amplitude: 16 mV
Lead Channel Setting Pacing Amplitude: 3.5 V
Lead Channel Setting Pacing Pulse Width: 0.4 ms
Lead Channel Setting Sensing Sensitivity: 0.3 mV

## 2019-04-17 NOTE — Progress Notes (Signed)
Wound check appointment. Steri-strips removed. Wound without redness or edema. Incision edges approximated, wound well healed. Normal device function. Thresholds, sensing, and impedances consistent with implant measurements. Device programmed at 3.5V for extra safety margin until 3 month visit. Histogram distribution appropriate for patient and level of activity. No mode switches or ventricular arrhythmias noted. Patient educated about wound care, arm mobility, lifting restrictions, shock plan. ROV in 3 months with Dr Curt Bears

## 2019-04-17 NOTE — Telephone Encounter (Signed)
Tamara Silva called me to advise she was out of Novolog and was unable to get in touch with the pharmacy. I advised I would check with the pharmacy and get back to her later in the day. She was understanding and agreeable. Upon speaking with the pharmacy, I learned that the medication was no able to be filled and she would be able to pick it up today. I called Tamara Silva back to let her know but was unable to get an answer. I will try again later in the day.

## 2019-04-17 NOTE — Telephone Encounter (Signed)
I called Tamara Silva to let her know she could pick up her medication from the pharmacy. She stated she would contact her daughter and have her pick it up.

## 2019-04-19 NOTE — Telephone Encounter (Signed)
Dr. Wynetta Emery can see the labs that are needed in the epic medical record and have those drawn

## 2019-04-30 ENCOUNTER — Ambulatory Visit: Payer: Medicaid Other | Admitting: Pharmacist

## 2019-05-04 ENCOUNTER — Encounter: Payer: Medicaid Other | Admitting: Cardiology

## 2019-05-04 ENCOUNTER — Telehealth: Payer: Self-pay | Admitting: *Deleted

## 2019-05-04 ENCOUNTER — Encounter: Payer: Self-pay | Admitting: Cardiology

## 2019-05-04 ENCOUNTER — Other Ambulatory Visit (HOSPITAL_COMMUNITY): Payer: Self-pay

## 2019-05-04 ENCOUNTER — Other Ambulatory Visit: Payer: Self-pay

## 2019-05-04 VITALS — BP 106/60 | Ht 67.5 in | Wt 282.0 lb

## 2019-05-04 NOTE — Progress Notes (Signed)
Erroneous encounter

## 2019-05-04 NOTE — Telephone Encounter (Signed)

## 2019-05-04 NOTE — Progress Notes (Signed)
Paramedicine Encounter    Patient ID: Tamara Silva, female    DOB: 10/02/1960, 58 y.o.   MRN: [id number]   Patient Care Team: Ladell Pier, MD as PCP - General (Internal Medicine) Larey Dresser, MD as PCP - Advanced Heart Failure (Cardiology) Constance Haw, MD as PCP - Electrophysiology (Cardiology) Sueanne Margarita, MD as PCP - Cardiology (Cardiology)  Patient Active Problem List   Diagnosis Date Noted  . Chronic systolic (congestive) heart failure ([place]) 04/04/2019  . Diabetic peripheral neuropathy ([place]) 01/19/2019  . Former smoker 01/19/2019  . CKD (chronic kidney disease) stage 3, GFR 30-59 ml/min 01/19/2019  . Cardiac arrest ([place]) 10/28/2018  . Seizure ([place]) 10/28/2018  . Noncompliance with treatment plan 07/24/2018  . General patient noncompliance 07/24/2018  . Noncompliance with diet and medication regimen 07/24/2018  . Dysarthria   . BiPAP (biphasic positive airway pressure) dependence   . Pancytopenia ([place]) 07/08/2018  . Type II diabetes mellitus with renal manifestations ([place]) 07/08/2018  . Hepatic steatosis 06/22/2018  . Chronic respiratory failure with hypoxia and hypercapnia (HCC)   . On home O2   . Slow transit constipation   . Cocaine abuse ([place])   . Diabetes mellitus type 2 in obese ([place])   . Generalized tonic-clonic seizure ([place]) 06/05/2018  . Cerebral embolism with cerebral infarction 06/01/2018  . Pressure injury of skin 05/30/2018  . Iron deficiency anemia 01/27/2018  . Tobacco abuse disorder 10/03/2017  . Dyslipidemia 04/29/2016  . Primary insomnia 04/29/2016  . Obesity hypoventilation syndrome ([place])   . OSA (obstructive sleep apnea)   . Dyslipidemia associated with type 2 diabetes mellitus ([place]) 05/21/2015  . COPD GOLD III  06/10/2014  . CAD S/P percutaneous coronary angioplasty - prior PCI to LAD; RCA PCI: new Xience Alpine DES 2.75 mm x 15 mm  10/07/2013  . Morbid obesity ([place]) 07/30/2013  . Chronic combined systolic and diastolic  heart failure ([place]) 07/10/2013  . Chronic pulmonary embolism ([place]) 02/08/2013  . Bipolar disorder ([place]) 02/08/2013  . Essential hypertension 02/08/2013  . COPD (chronic obstructive pulmonary disease) ([place]) 10/15/2012  . Pulmonary embolism on right ([place]) 10/15/2012    Current Outpatient Medications:  .  ACCU-CHEK AVIVA PLUS test strip, USE 1 STRIP TO CHECK BLOOD GLUCOSE 3 TIMES A DAY, Disp: 100 each, Rfl: 2 .  acetaminophen (TYLENOL) 500 MG tablet, Take 500 mg by mouth every 6 (six) hours as needed for moderate pain or headache., Disp: , Rfl:  .  albuterol (PROAIR HFA) 108 (90 Base) MCG/ACT inhaler, INHALE 2 PUFFS BY MOUTH 4 TIMES DAILY AS NEEDED (Patient taking differently: Inhale 2 puffs into the lungs every 4 (four) hours as needed. INHALE 2 PUFFS BY MOUTH 4 TIMES DAILY AS NEEDED), Disp: 18 g, Rfl: 11 .  aspirin EC 81 MG tablet, Take 1 tablet (81 mg total) by mouth daily., Disp: 90 tablet, Rfl: 3 .  bisoprolol (ZEBETA) 5 MG tablet, Take 1 tablet (5 mg total) by mouth daily., Disp: 30 tablet, Rfl: 6 .  ferrous sulfate 325 (65 FE) MG tablet, Take 325 mg by mouth daily with breakfast., Disp: , Rfl:  .  hydrocerin (EUCERIN) CREA, Apply 1 application topically 2 (two) times daily. (Patient not taking: Reported on 04/11/2019), Disp: , Rfl: 0 .  Insulin Degludec (TRESIBA FLEXTOUCH) 200 UNIT/ML SOPN, Inject 60 Units into the skin daily., Disp: 3 pen, Rfl: 1 .  insulin detemir (LEVEMIR) 100 UNIT/ML injection, Inject 26 units [place] BID. (Patient not taking: Reported on 04/11/2019),  Disp: 10 mL, Rfl: 2 .  ipratropium-albuterol (DUONEB) 0.5-2.5 (3) MG/3ML SOLN, Use nebulizer to inhale contents of one vial every 6 hours as needed for shortness of breath, wheezing (Patient taking differently: Inhale 3 mLs into the lungs every 6 (six) hours as needed (shortness of breath and wheezing). ), Disp: 360 mL, Rfl: 11 .  isosorbide-hydrALAZINE (BIDIL) 20-37.5 MG tablet, Take 1 tablet by mouth 3 (three) times daily.,  Disp: 90 tablet, Rfl: 6 .  liraglutide (VICTOZA) 18 MG/3ML SOPN, Inject 1.8 mg into the skin daily., Disp: 9 mL, Rfl: 2 .  NEEDLE, DISP, 26 G 26G X 1/2" MISC, 1 application by Does not apply route daily., Disp: 100 each, Rfl: 0 .  NOVOLOG FLEXPEN 100 UNIT/ML FlexPen, INJECT 18 UNITS BEFORE dinner as instructed by Dr. Dwyane Dee. (Patient not taking: Reported on 04/11/2019), Disp: 15 mL, Rfl: 0 .  omeprazole (PRILOSEC) 40 MG capsule, TAKE 1 CAPSULE (40 MG TOTAL) BY MOUTH DAILY., Disp: 30 capsule, Rfl: 2 .  PAZEO 0.7 % SOLN, Place 1 drop into both eyes daily. , Disp: , Rfl:  .  potassium chloride SA (KLOR-CON) 20 MEQ tablet, TAKE 1 TABLET (20 MEQ TOTAL) BY MOUTH DAILY., Disp: 30 tablet, Rfl: 3 .  RESTASIS 0.05 % ophthalmic emulsion, Place 1 drop into both eyes 2 (two) times daily. , Disp: , Rfl:  .  rosuvastatin (CRESTOR) 40 MG tablet, TAKE 1 TABLET (40 MG TOTAL) BY MOUTH DAILY AT 6 PM., Disp: 30 tablet, Rfl: 3 .  spironolactone (ALDACTONE) 25 MG tablet, TAKE 1 TABLET (25 MG TOTAL) BY MOUTH DAILY., Disp: 90 tablet, Rfl: 1 .  torsemide (DEMADEX) 20 MG tablet, Take 3 tablets (60 mg total) by mouth 2 (two) times daily., Disp: 150 tablet, Rfl: 3 .  umeclidinium-vilanterol (ANORO ELLIPTA) 62.5-25 MCG/INH AEPB, Inhale 1 puff into the lungs daily. (Patient not taking: Reported on 04/11/2019), Disp: 1 each, Rfl: 5 .  vitamin B-12 (CYANOCOBALAMIN) 1000 MCG tablet, Take 1,000 mcg by mouth daily., Disp: , Rfl:  Allergies  Allergen Reactions  . Metolazone Other (See Comments)    Dizziness and falling  . Plavix [Clopidogrel Bisulfate] Other (See Comments)    High PRU's - non-responder  . Ibuprofen Other (See Comments)    Pt states she is not supposed to take ibuprofen because of other meds she is taking  . Nsaids Other (See Comments)    "I do not take NSAIDs d/t interfering with other meds"      Social History   Socioeconomic History  . Marital status: Single    Spouse name: Not on file  . Number of  children: 1  . Years of education: Not on file  . Highest education level: Not on file  Occupational History  . Not on file  Social Needs  . Financial resource strain: Somewhat hard  . Food insecurity    Worry: Sometimes true    Inability: Sometimes true  . Transportation needs    Medical: Yes    Non-medical: Yes  Tobacco Use  . Smoking status: Former Smoker    Packs/day: 0.50    Years: 20.00    Pack years: 10.00    Types: Cigarettes    Quit date: 05/31/2018    Years since quitting: 0.9  . Smokeless tobacco: Never Used  . Tobacco comment: smoked off and on x 20 years  Substance and Sexual Activity  . Alcohol use: No    Alcohol/week: 0.0 standard drinks  . Drug use: Not Currently  Types: Cocaine  . Sexual activity: Not Currently  Lifestyle  . Physical activity    Days per week: 0 days    Minutes per session: 0 min  . Stress: Rather much  Relationships  . Social Herbalist on phone: Twice a week    Gets together: Once a week    Attends religious service: 1 to 4 times per year    Active member of club or organization: No    Attends meetings of clubs or organizations: Never    Relationship status: Living with partner  . Intimate partner violence    Fear of current or ex partner: Patient refused    Emotionally abused: Patient refused    Physically abused: Patient refused    Forced sexual activity: Patient refused  Other Topics Concern  . Not on file  Social History Narrative   Origibnally from [place]   Most recently from [place]   Daughter lives in town      On Social security to CHF, COPD, CAD    Physical Exam Cardiovascular:     Rate and Rhythm: Normal rate and regular rhythm.     Pulses: Normal pulses.  Pulmonary:     Effort: Pulmonary effort is normal.     Breath sounds: Normal breath sounds.  Musculoskeletal: Normal range of motion.     Right lower leg: No edema.     Left lower leg: No edema.  Skin:    General: Skin is warm and dry.      Capillary Refill: Capillary refill takes less than 2 seconds.  Neurological:     Mental Status: She is alert and oriented to person, place, and time.         Future Appointments  Date Time Provider [place]  05/04/2019  1:00 PM Sueanne Margarita, MD [facility]  05/08/2019 11:00 AM [facility] None  05/18/2019  9:30 AM Elayne Snare, MD [facility] None  05/28/2019 11:40 AM Larey Dresser, MD [facility] None  05/29/2019  2:30 PM Laurin Coder, MD [facility] None  07/02/2019 11:10 AM Ladell Pier, MD [facility] None  07/05/2019  7:05 AM [facility] DEVICE REMOTES [facility]  07/10/2019  2:30 PM Camnitz, Ocie Doyne, MD [facility]    BP 106/60 (BP Location: Left Arm, Patient Position: Sitting, Cuff Size: Normal)   Pulse 70   Resp 16   Wt 282 lb (127.9 kg)   LMP 07/28/2013 Comment: perimenopausal  SpO2 98%   BMI 43.52 kg/m   Weight yesterday-  Last visit weight- 288 lb  Ms Lamison was see at home today and reported feeling well. She denied chest pain, SOB, headache, dizziness, orthopnea, fever or cough since our last visit. She stated she has been compliant with her medications and her weight has been stable. Her medications were verified but she does not use a pillbox. I will follow up in two weeks unless it becomes necessary sooner.    Jacquiline Doe, EMT 05/04/19  ACTION: Home visit completed Next visit planned for 2 week

## 2019-05-07 ENCOUNTER — Other Ambulatory Visit: Payer: Self-pay | Admitting: Endocrinology

## 2019-05-07 ENCOUNTER — Ambulatory Visit: Payer: Medicaid Other | Admitting: Endocrinology

## 2019-05-08 ENCOUNTER — Other Ambulatory Visit (INDEPENDENT_AMBULATORY_CARE_PROVIDER_SITE_OTHER): Payer: Medicaid Other

## 2019-05-08 ENCOUNTER — Other Ambulatory Visit: Payer: Self-pay

## 2019-05-08 DIAGNOSIS — Z794 Long term (current) use of insulin: Secondary | ICD-10-CM | POA: Diagnosis not present

## 2019-05-08 DIAGNOSIS — E1165 Type 2 diabetes mellitus with hyperglycemia: Secondary | ICD-10-CM | POA: Diagnosis not present

## 2019-05-08 DIAGNOSIS — R7989 Other specified abnormal findings of blood chemistry: Secondary | ICD-10-CM

## 2019-05-08 LAB — URINALYSIS, ROUTINE W REFLEX MICROSCOPIC
Bilirubin Urine: NEGATIVE
Hgb urine dipstick: NEGATIVE
Ketones, ur: NEGATIVE
Leukocytes,Ua: NEGATIVE
Nitrite: NEGATIVE
RBC / HPF: NONE SEEN (ref 0–?)
Specific Gravity, Urine: 1.02 (ref 1.000–1.030)
Total Protein, Urine: 30 — AB
Urine Glucose: NEGATIVE
Urobilinogen, UA: 0.2 (ref 0.0–1.0)
pH: 6 (ref 5.0–8.0)

## 2019-05-08 LAB — MICROALBUMIN / CREATININE URINE RATIO
Creatinine,U: 131.6 mg/dL
Microalb Creat Ratio: 7.1 mg/g (ref 0.0–30.0)
Microalb, Ur: 9.4 mg/dL — ABNORMAL HIGH (ref 0.0–1.9)

## 2019-05-08 LAB — COMPREHENSIVE METABOLIC PANEL
ALT: 17 U/L (ref 0–35)
AST: 19 U/L (ref 0–37)
Albumin: 3.7 g/dL (ref 3.5–5.2)
Alkaline Phosphatase: 127 U/L — ABNORMAL HIGH (ref 39–117)
BUN: 29 mg/dL — ABNORMAL HIGH (ref 6–23)
CO2: 35 mEq/L — ABNORMAL HIGH (ref 19–32)
Calcium: 9.5 mg/dL (ref 8.4–10.5)
Chloride: 100 mEq/L (ref 96–112)
Creatinine, Ser: 1.51 mg/dL — ABNORMAL HIGH (ref 0.40–1.20)
GFR: 42.61 mL/min — ABNORMAL LOW (ref >60.00)
Glucose, Bld: 73 mg/dL (ref 70–99)
Potassium: 3.8 mEq/L (ref 3.5–5.1)
Sodium: 140 mEq/L (ref 135–145)
Total Bilirubin: 0.2 mg/dL (ref 0.2–1.2)
Total Protein: 8.1 g/dL (ref 6.0–8.3)

## 2019-05-08 LAB — T4, FREE: Free T4: 0.83 ng/dL (ref 0.60–1.60)

## 2019-05-08 LAB — TSH: TSH: 3.11 u[IU]/mL (ref 0.35–4.50)

## 2019-05-09 LAB — FRUCTOSAMINE: Fructosamine: 261 umol/L (ref 0–285)

## 2019-05-17 ENCOUNTER — Telehealth: Payer: Self-pay | Admitting: Hematology and Oncology

## 2019-05-17 ENCOUNTER — Other Ambulatory Visit (HOSPITAL_COMMUNITY): Payer: Self-pay | Admitting: Cardiology

## 2019-05-17 ENCOUNTER — Other Ambulatory Visit (HOSPITAL_COMMUNITY): Payer: Self-pay

## 2019-05-17 MED ORDER — TORSEMIDE 20 MG PO TABS: 60.0000 mg | ORAL_TABLET | Freq: Two times a day (BID) | ORAL | 3 refills | Status: DC

## 2019-05-17 NOTE — Telephone Encounter (Signed)
[facility] transfer to [place]. Confirmed January appointment with patient.

## 2019-05-17 NOTE — Progress Notes (Signed)
Paramedicine Encounter    Patient ID: Tamara Silva, female    DOB: 1961-05-13, 58 y.o.   MRN: [id number]   Patient Care Team: Ladell Pier, MD as PCP - General (Internal Medicine) Larey Dresser, MD as PCP - Advanced Heart Failure (Cardiology) Constance Haw, MD as PCP - Electrophysiology (Cardiology) Sueanne Margarita, MD as PCP - Cardiology (Cardiology)  Patient Active Problem List   Diagnosis Date Noted  . Chronic systolic (congestive) heart failure ([place]) 04/04/2019  . Diabetic peripheral neuropathy ([place]) 01/19/2019  . Former smoker 01/19/2019  . CKD (chronic kidney disease) stage 3, GFR 30-59 ml/min 01/19/2019  . Cardiac arrest ([place]) 10/28/2018  . Seizure ([place]) 10/28/2018  . Noncompliance with treatment plan 07/24/2018  . General patient noncompliance 07/24/2018  . Noncompliance with diet and medication regimen 07/24/2018  . Dysarthria   . BiPAP (biphasic positive airway pressure) dependence   . Pancytopenia ([place]) 07/08/2018  . Type II diabetes mellitus with renal manifestations ([place]) 07/08/2018  . Hepatic steatosis 06/22/2018  . Chronic respiratory failure with hypoxia and hypercapnia (H[facility])   . On home O2   . Slow transit constipation   . Cocaine abuse ([place])   . Diabetes mellitus type 2 in obese ([place])   . Generalized tonic-clonic seizure ([place]) 06/05/2018  . Cerebral embolism with cerebral infarction 06/01/2018  . Pressure injury of skin 05/30/2018  . Iron deficiency anemia 01/27/2018  . Tobacco abuse disorder 10/03/2017  . Dyslipidemia 04/29/2016  . Primary insomnia 04/29/2016  . Obesity hypoventilation syndrome ([place])   . OSA (obstructive sleep apnea)   . Dyslipidemia associated with type 2 diabetes mellitus ([place]) 05/21/2015  . COPD GOLD III  06/10/2014  . CAD S/P percutaneous coronary angioplasty - prior PCI to LAD; RCA PCI: new Xience Alpine DES 2.75 mm x 15 mm  10/07/2013  . Morbid obesity ([place]) 07/30/2013  . Chronic combined systolic and diastolic  heart failure ([place]) 07/10/2013  . Chronic pulmonary embolism ([place]) 02/08/2013  . Bipolar disorder ([place]) 02/08/2013  . Essential hypertension 02/08/2013  . COPD (chronic obstructive pulmonary disease) ([place]) 10/15/2012  . Pulmonary embolism on right ([place]) 10/15/2012    Current Outpatient Medications:  .  ACCU-CHEK AVIVA PLUS test strip, USE 1 STRIP TO CHECK BLOOD GLUCOSE 3 TIMES A DAY, Disp: 100 each, Rfl: 2 .  acetaminophen (TYLENOL) 500 MG tablet, Take 500 mg by mouth every 6 (six) hours as needed for moderate pain or headache., Disp: , Rfl:  .  albuterol (PROAIR HFA) 108 (90 Base) MCG/ACT inhaler, INHALE 2 PUFFS BY MOUTH 4 TIMES DAILY AS NEEDED (Patient taking differently: Inhale 2 puffs into the lungs every 4 (four) hours as needed. INHALE 2 PUFFS BY MOUTH 4 TIMES DAILY AS NEEDED), Disp: 18 g, Rfl: 11 .  aspirin EC 81 MG tablet, Take 1 tablet (81 mg total) by mouth daily., Disp: 90 tablet, Rfl: 3 .  bisoprolol (ZEBETA) 5 MG tablet, Take 1 tablet (5 mg total) by mouth daily., Disp: 30 tablet, Rfl: 6 .  ferrous sulfate 325 (65 FE) MG tablet, Take 325 mg by mouth daily with breakfast., Disp: , Rfl:  .  hydrocerin (EUCERIN) CREA, Apply 1 application topically 2 (two) times daily., Disp: , Rfl: 0 .  insulin detemir (LEVEMIR) 100 UNIT/ML injection, Inject 26 units [place] BID., Disp: 10 mL, Rfl: 2 .  ipratropium-albuterol (DUONEB) 0.5-2.5 (3) MG/3ML SOLN, Use nebulizer to inhale contents of one vial every 6 hours as needed for shortness of breath, wheezing (Patient taking  differently: Inhale 3 mLs into the lungs every 6 (six) hours as needed (shortness of breath and wheezing). ), Disp: 360 mL, Rfl: 11 .  isosorbide-hydrALAZINE (BIDIL) 20-37.5 MG tablet, Take 1 tablet by mouth 3 (three) times daily., Disp: 90 tablet, Rfl: 6 .  liraglutide (VICTOZA) 18 MG/3ML SOPN, Inject 1.8 mg into the skin daily., Disp: 9 mL, Rfl: 2 .  NOVOLOG FLEXPEN 100 UNIT/ML FlexPen, INJECT 18 UNITS BEFORE dinner as instructed by  Dr. Dwyane Dee., Disp: 15 mL, Rfl: 0 .  omeprazole (PRILOSEC) 40 MG capsule, TAKE 1 CAPSULE (40 MG TOTAL) BY MOUTH DAILY., Disp: 30 capsule, Rfl: 2 .  potassium chloride SA (KLOR-CON) 20 MEQ tablet, TAKE 1 TABLET (20 MEQ TOTAL) BY MOUTH DAILY., Disp: 30 tablet, Rfl: 3 .  RESTASIS 0.05 % ophthalmic emulsion, Place 1 drop into both eyes 2 (two) times daily. , Disp: , Rfl:  .  rosuvastatin (CRESTOR) 40 MG tablet, TAKE 1 TABLET (40 MG TOTAL) BY MOUTH DAILY AT 6 PM., Disp: 30 tablet, Rfl: 3 .  spironolactone (ALDACTONE) 25 MG tablet, TAKE 1 TABLET (25 MG TOTAL) BY MOUTH DAILY., Disp: 90 tablet, Rfl: 1 .  TRESIBA FLEXTOUCH 200 UNIT/ML SOPN, INJECT 60 UNITS INTO THE SKIN DAILY., Disp: 15 mL, Rfl: 1 .  vitamin B-12 (CYANOCOBALAMIN) 1000 MCG tablet, Take 1,000 mcg by mouth daily., Disp: , Rfl:  .  NEEDLE, DISP, 26 G 26G X 1/2" MISC, 1 application by Does not apply route daily., Disp: 100 each, Rfl: 0 .  PAZEO 0.7 % SOLN, Place 1 drop into both eyes daily. , Disp: , Rfl:  .  torsemide (DEMADEX) 20 MG tablet, Take 3 tablets (60 mg total) by mouth 2 (two) times daily., Disp: 180 tablet, Rfl: 3 .  umeclidinium-vilanterol (ANORO ELLIPTA) 62.5-25 MCG/INH AEPB, Inhale 1 puff into the lungs daily. (Patient not taking: Reported on 04/11/2019), Disp: 1 each, Rfl: 5 Allergies  Allergen Reactions  . Metolazone Other (See Comments)    Dizziness and falling  . Plavix [Clopidogrel Bisulfate] Other (See Comments)    High PRU's - non-responder  . Ibuprofen Other (See Comments)    Pt states she is not supposed to take ibuprofen because of other meds she is taking  . Nsaids Other (See Comments)    "I do not take NSAIDs d/t interfering with other meds"      Social History   Socioeconomic History  . Marital status: Single    Spouse name: Not on file  . Number of children: 1  . Years of education: Not on file  . Highest education level: Not on file  Occupational History  . Not on file  Tobacco Use  . Smoking  status: Former Smoker    Packs/day: 0.50    Years: 20.00    Pack years: 10.00    Types: Cigarettes    Quit date: 05/31/2018    Years since quitting: 0.9  . Smokeless tobacco: Never Used  . Tobacco comment: smoked off and on x 20 years  Substance and Sexual Activity  . Alcohol use: No    Alcohol/week: 0.0 standard drinks  . Drug use: Not Currently    Types: Cocaine  . Sexual activity: Not Currently  Other Topics Concern  . Not on file  Social History Narrative   Origibnally from [place]   Most recently from [place]   Daughter lives in town      On Social security to CHF, COPD, CAD   Social Determinants of Health  Financial Resource Strain:   . Difficulty of Paying Living Expenses: Not on file  Food Insecurity:   . Worried About Charity fundraiser in the Last Year: Not on file  . Ran Out of Food in the Last Year: Not on file  Transportation Needs:   . Lack of Transportation (Medical): Not on file  . Lack of Transportation (Non-Medical): Not on file  Physical Activity:   . Days of Exercise per Week: Not on file  . Minutes of Exercise per Session: Not on file  Stress:   . Feeling of Stress : Not on file  Social Connections:   . Frequency of Communication with Friends and Family: Not on file  . Frequency of Social Gatherings with Friends and Family: Not on file  . Attends Religious Services: Not on file  . Active Member of Clubs or Organizations: Not on file  . Attends Archivist Meetings: Not on file  . Marital Status: Not on file  Intimate Partner Violence: Unknown  . Fear of Current or Ex-Partner: Patient refused  . Emotionally Abused: Patient refused  . Physically Abused: Patient refused  . Sexually Abused: Patient refused    Physical Exam Cardiovascular:     Rate and Rhythm: Normal rate and regular rhythm.     Pulses: Normal pulses.  Pulmonary:     Effort: Pulmonary effort is normal.     Breath sounds: Normal breath sounds.  Musculoskeletal:         General: Normal range of motion.     Right lower leg: No edema.     Left lower leg: No edema.  Skin:    General: Skin is warm and dry.     Capillary Refill: Capillary refill takes less than 2 seconds.  Neurological:     Mental Status: She is alert and oriented to person, place, and time.  Psychiatric:        Mood and Affect: Mood normal.         Future Appointments  Date Time Provider [place]  05/18/2019  9:30 AM Elayne Snare, MD [facility] None  05/28/2019 11:40 AM Larey Dresser, MD [facility] None  05/29/2019  2:30 PM Laurin Coder, MD [facility] None  06/11/2019 11:00 AM [facility] None  06/11/2019 11:30 AM Orson Slick, MD [facility] None  07/02/2019 11:10 AM Ladell Pier, MD [facility] None  07/05/2019  7:05 AM [facility] DEVICE REMOTES [facility]  07/10/2019  2:30 PM Camnitz, Ocie Doyne, MD [facility]    BP 124/60 (BP Location: Right Arm, Patient Position: Sitting, Cuff Size: Large)   Pulse 86   Resp 16   Wt 292 lb 3.2 oz (132.5 kg)   LMP 07/28/2013 Comment: perimenopausal  SpO2 94%   BMI 45.09 kg/m   Weight yesterday- did not weigh Last visit weight- 282 lb  Tamara Silva was seen at home today and reported feeling well. She denied chest pain, SOB, headache, dizziness, orthopnea, fever or cough since our last visit. She stated she has been compliant with her medications but her weight has increased 10 lb over the past two weeks. I advised her to avoid salt heavy foods and limit her fluid intake and she was agreeable. Her medications were verified and all necessary refills were ordered. I will follow up in two weeks.   Jacquiline Doe, EMT 05/17/19  ACTION: Home visit completed Next visit planned for 2 weeks

## 2019-05-18 ENCOUNTER — Ambulatory Visit (INDEPENDENT_AMBULATORY_CARE_PROVIDER_SITE_OTHER): Payer: Medicaid Other | Admitting: Endocrinology

## 2019-05-18 DIAGNOSIS — E1165 Type 2 diabetes mellitus with hyperglycemia: Secondary | ICD-10-CM | POA: Diagnosis not present

## 2019-05-18 DIAGNOSIS — E1142 Type 2 diabetes mellitus with diabetic polyneuropathy: Secondary | ICD-10-CM

## 2019-05-18 DIAGNOSIS — Z794 Long term (current) use of insulin: Secondary | ICD-10-CM | POA: Diagnosis not present

## 2019-05-18 DIAGNOSIS — IMO0002 Reserved for concepts with insufficient information to code with codable children: Secondary | ICD-10-CM

## 2019-05-18 MED ORDER — GABAPENTIN 300 MG PO CAPS: 300.0000 mg | ORAL_CAPSULE | Freq: Three times a day (TID) | ORAL | 3 refills | Status: DC

## 2019-05-18 NOTE — Progress Notes (Signed)
Patient ID: Tamara Silva, female   DOB: [birth year], 58 y.o.   MRN: [id number]          Reason for Appointment: Follow-up visit for Type 2 Diabetes  Referring PCP: Dr. Karle Plumber  Today's office visit was provided via telemedicine using video technique The patient was explained the limitations of evaluation and management by telemedicine and the availability of in person appointments.  The patient understood the limitations and agreed to proceed. Patient also understood that the telehealth visit is billable. . Location of the patient: Patient's home . Location of the provider: Physician office Only the patient and myself were participating in the encounter    History of Present Illness:          Date of diagnosis of type 2 diabetes mellitus: 2000       Background history:  She thinks her blood sugar was over 600 at the time of diagnosis and was started on insulin from the start Also she took Metformin for couple of years but she thinks it was stopped because it did not work Her diabetes history is only available since about 2016 in her chart She has been mostly on Levemir insulin Appears to have had sporadic follow-up with only 4 or 5 A1c results available in the last 4 years  Recent history:   Most recent A1c is 8.8 done on 01/19/2019  fructosamine is 261  INSULIN regimen is: [place] 60 units daily, NovoLog 18 units before meals       Non-insulin hypoglycemic drugs the patient is taking are: Victoza 1.8 mg daily  Current management, blood sugar patterns and problems identified:  She has switched from Levemir to [place], previously was on 35 units twice daily of Levemir  However even with 60 units of Tresiba most of her fasting readings are higher  Blood sugars are available only for the last week from review of meter on the phone  Generally blood sugars appear to be better than on her last visit with increasing insulin especially basal insulin and suppertime  dose  She has done mostly readings in the mornings and some at variable times in the evenings  Only a couple of readings are available after supper without any consistent pattern  She sometimes eats snacks after dinner which are variable, can be ranging from peanut butter crackers to doughnuts without any extra coverage  She has continued her Victoza   No hypoglycemia reported        Side effects from medications have been: None     Meal times are: Dinner: 6-7 PM             Exercise:  Only some walking around the house  Glucose monitoring:  done about 2 times a day         Glucometer:  Accu-Chek.       Blood Glucose readings by time of day and averages from meter review:   PRE-MEAL Fasting Lunch Dinner Bedtime Overall  Glucose range:  122-261  123-207  104-190    Mean/median:     ?   POST-MEAL PC Breakfast PC Lunch PC Dinner  Glucose range:  154   126, 209  Mean/median:      Previous readings:  PREMEAL Breakfast Lunch Dinner Bedtime  Overall   Glucose range:  216-400  170  178-314  155,227 ?  Median:          Dietician visit, most recent: During last hospitalization  Weight history:  Wt Readings from  Last 3 Encounters:  05/17/19 292 lb 3.2 oz (132.5 kg)  05/04/19 282 lb (127.9 kg)  05/04/19 282 lb (127.9 kg)    Glycemic control:   Lab Results  Component Value Date   HGBA1C 8.8 (A) 01/19/2019   HGBA1C 7.1 (H) 06/01/2018   HGBA1C 8.0 (A) 01/23/2018   HGBA1C 8 01/23/2018   HGBA1C 8 (A) 01/23/2018   HGBA1C 8.0 (A) 01/23/2018   Lab Results  Component Value Date   MICROALBUR 9.4 (H) 05/08/2019   LDLCALC 52 02/07/2019   CREATININE 1.51 (H) 05/08/2019   Lab Results  Component Value Date   MICRALBCREAT 7.1 05/08/2019    Lab Results  Component Value Date   FRUCTOSAMINE 261 05/08/2019    No visits with results within 1 Week(s) from this visit.  Latest known visit with results is:  Lab on 05/08/2019  Component Date Value Ref Range Status  .  Fructosamine 05/08/2019 261  0 - 285 umol/L Final   Comment: Published reference interval for apparently healthy subjects between age 73 and 26 is 42 - 285 umol/L and in a poorly controlled diabetic population is 228 - 563 umol/L with a mean of 396 umol/L.   [name] Kitchen Sodium 05/08/2019 140  135 - 145 mEq/L Final  . Potassium 05/08/2019 3.8  3.5 - 5.1 mEq/L Final  . Chloride 05/08/2019 100  96 - 112 mEq/L Final  . CO2 05/08/2019 35* 19 - 32 mEq/L Final  . Glucose, Bld 05/08/2019 73  70 - 99 mg/dL Final  . BUN 05/08/2019 29* 6 - 23 mg/dL Final  . Creatinine, Ser 05/08/2019 1.51* 0.40 - 1.20 mg/dL Final  . Total Bilirubin 05/08/2019 0.2  0.2 - 1.2 mg/dL Final  . Alkaline Phosphatase 05/08/2019 127* 39 - 117 U/L Final  . AST 05/08/2019 19  0 - 37 U/L Final  . ALT 05/08/2019 17  0 - 35 U/L Final  . Total Protein 05/08/2019 8.1  6.0 - 8.3 g/dL Final  . Albumin 05/08/2019 3.7  3.5 - 5.2 g/dL Final  . GFR 05/08/2019 42.61* >60.00 mL/min Final  . Calcium 05/08/2019 9.5  8.4 - 10.5 mg/dL Final  . Free T4 05/08/2019 0.83  0.60 - 1.60 ng/dL Final   Comment: Specimens from patients who are undergoing biotin therapy and /or ingesting biotin supplements may contain high levels of biotin.  The higher biotin concentration in these specimens interferes with this Free T4 assay.  Specimens that contain high levels  of biotin may cause false high results for this Free T4 assay.  Please interpret results in light of the total clinical presentation of the patient.    [name] Kitchen TSH 05/08/2019 3.11  0.35 - 4.50 uIU/mL Final  . Color, Urine 05/08/2019 YELLOW  Yellow;Lt. Yellow;Straw;Dark Yellow;Amber;Green;Red;Brown Final  . APPearance 05/08/2019 CLEAR  Clear;Turbid;Slightly Cloudy;Cloudy Final  . Specific Gravity, Urine 05/08/2019 1.020  1.000 - 1.030 Final  . pH 05/08/2019 6.0  5.0 - 8.0 Final  . Total Protein, Urine 05/08/2019 30* Negative Final  . Urine Glucose 05/08/2019 NEGATIVE  Negative Final  . Ketones, ur  05/08/2019 NEGATIVE  Negative Final  . Bilirubin Urine 05/08/2019 NEGATIVE  Negative Final  . Hgb urine dipstick 05/08/2019 NEGATIVE  Negative Final  . Urobilinogen, UA 05/08/2019 0.2  0.0 - 1.0 Final  . Leukocytes,Ua 05/08/2019 NEGATIVE  Negative Final  . Nitrite 05/08/2019 NEGATIVE  Negative Final  . WBC, UA 05/08/2019 3-6/hpf* 0-2/hpf Final  . RBC / HPF 05/08/2019 none seen  0-2/hpf Final  . Squamous  Epithelial / LPF 05/08/2019 Few(5-10/hpf)* Rare(0-4/hpf) Final  . Microalb, Ur 05/08/2019 9.4* 0.0 - 1.9 mg/dL Final  . Creatinine,U 05/08/2019 131.6  mg/dL Final  . Microalb Creat Ratio 05/08/2019 7.1  0.0 - 30.0 mg/g Final    Allergies as of 05/18/2019      Reactions   Metolazone Other (See Comments)   Dizziness and falling   Plavix [clopidogrel Bisulfate] Other (See Comments)   High PRU's - non-responder   Ibuprofen Other (See Comments)   Pt states she is not supposed to take ibuprofen because of other meds she is taking   Nsaids Other (See Comments)   "I do not take NSAIDs d/t interfering with other meds"      Medication List       Accurate as of May 18, 2019 10:14 AM. If you have any questions, ask your nurse or doctor.        Accu-Chek Aviva Plus test strip Generic drug: glucose blood USE 1 STRIP TO CHECK BLOOD GLUCOSE 3 TIMES A DAY   acetaminophen 500 MG tablet Commonly known as: TYLENOL Take 500 mg by mouth every 6 (six) hours as needed for moderate pain or headache.   albuterol 108 (90 Base) MCG/ACT inhaler Commonly known as: ProAir HFA INHALE 2 PUFFS BY MOUTH 4 TIMES DAILY AS NEEDED What changed:   how much to take  how to take this  when to take this  reasons to take this   Anoro Ellipta 62.5-25 MCG/INH Aepb Generic drug: umeclidinium-vilanterol Inhale 1 puff into the lungs daily.   aspirin EC 81 MG tablet Take 1 tablet (81 mg total) by mouth daily.   BiDil 20-37.5 MG tablet Generic drug: isosorbide-hydrALAZINE Take 1 tablet by mouth 3  (three) times daily.   bisoprolol 5 MG tablet Commonly known as: ZEBETA Take 1 tablet (5 mg total) by mouth daily.   ferrous sulfate 325 (65 FE) MG tablet Take 325 mg by mouth daily with breakfast.   hydrocerin Crea Apply 1 application topically 2 (two) times daily.   insulin detemir 100 UNIT/ML injection Commonly known as: LEVEMIR Inject 26 units [place] BID.   ipratropium-albuterol 0.5-2.5 (3) MG/3ML Soln Commonly known as: DUONEB Use nebulizer to inhale contents of one vial every 6 hours as needed for shortness of breath, wheezing What changed:   how much to take  how to take this  when to take this  reasons to take this  additional instructions   liraglutide 18 MG/3ML Sopn Commonly known as: VICTOZA Inject 1.8 mg into the skin daily.   NEEDLE (DISP) 26 G 26G X 1/2" Misc 1 application by Does not apply route daily.   NovoLOG FlexPen 100 UNIT/ML FlexPen Generic drug: insulin aspart INJECT 18 UNITS BEFORE dinner as instructed by Dr. Dwyane Dee.   omeprazole 40 MG capsule Commonly known as: PRILOSEC TAKE 1 CAPSULE (40 MG TOTAL) BY MOUTH DAILY.   Pazeo 0.7 % Soln Generic drug: Olopatadine HCl Place 1 drop into both eyes daily.   potassium chloride SA 20 MEQ tablet Commonly known as: KLOR-CON TAKE 1 TABLET (20 MEQ TOTAL) BY MOUTH DAILY.   Restasis 0.05 % ophthalmic emulsion Generic drug: cycloSPORINE Place 1 drop into both eyes 2 (two) times daily.   rosuvastatin 40 MG tablet Commonly known as: CRESTOR TAKE 1 TABLET (40 MG TOTAL) BY MOUTH DAILY AT 6 PM.   spironolactone 25 MG tablet Commonly known as: ALDACTONE TAKE 1 TABLET (25 MG TOTAL) BY MOUTH DAILY.   torsemide 20 MG tablet  Commonly known as: DEMADEX Take 3 tablets (60 mg total) by mouth 2 (two) times daily.   Tyler Aas FlexTouch 200 UNIT/ML Sopn Generic drug: Insulin Degludec INJECT 60 UNITS INTO THE SKIN DAILY.   vitamin B-12 1000 MCG tablet Commonly known as: CYANOCOBALAMIN Take 1,000 mcg by mouth  daily.       Allergies:  Allergies  Allergen Reactions  . Metolazone Other (See Comments)    Dizziness and falling  . Plavix [Clopidogrel Bisulfate] Other (See Comments)    High PRU's - non-responder  . Ibuprofen Other (See Comments)    Pt states she is not supposed to take ibuprofen because of other meds she is taking  . Nsaids Other (See Comments)    "I do not take NSAIDs d/t interfering with other meds"    Past Medical History:  Diagnosis Date  . AICD (automatic cardioverter/defibrillator) present 04/04/2019  . Asthma   . CAD (coronary artery disease)   . Chronic combined systolic (congestive) and diastolic (congestive) heart failure ([place])   . Chronic iron deficiency anemia   . CVA (cerebral vascular accident) ([place]) 05/2018  . Diabetes mellitus without complication ([place])   . DOE (dyspnea on exertion)   . Edema of both legs   . Hepatic steatosis   . Hypertension   . Pulmonary emboli ([place]) 2014  . Respiratory failure ([place]) 09/2018  . Seizure ([place]) 05/2018    Past Surgical History:  Procedure Laterality Date  . BIOPSY  07/10/2018   Procedure: BIOPSY;  Surgeon: [name], MD;  Location: [facility];  Service: Gastroenterology;;  . COLONOSCOPY WITH PROPOFOL N/A 08/03/2013   Procedure: COLONOSCOPY WITH PROPOFOL;  Surgeon: Jeryl Columbia, MD;  Location: [facility];  Service: Endoscopy;  Laterality: N/A;  . COLONOSCOPY WITH PROPOFOL N/A 07/10/2018   Procedure: COLONOSCOPY WITH PROPOFOL;  Surgeon: [name], MD;  Location: [facility];  Service: Gastroenterology;  Laterality: N/A;  . CORONARY ANGIOPLASTY WITH STENT PLACEMENT     CAD in 2006 x 2 and 2009 2 more- place din REx in [place] [facility] [place] [facility]  . CORONARY ANGIOPLASTY WITH STENT PLACEMENT  10/07/2013   Xience [place] DES 2.75  mm x 15  mm  . ESOPHAGOGASTRODUODENOSCOPY (EGD) WITH PROPOFOL N/A 08/03/2013   Procedure: ESOPHAGOGASTRODUODENOSCOPY (EGD) WITH PROPOFOL;  Surgeon: Jeryl Columbia, MD;  Location: [facility];   Service: Endoscopy;  Laterality: N/A;  . ESOPHAGOGASTRODUODENOSCOPY (EGD) WITH PROPOFOL N/A 07/10/2018   Procedure: ESOPHAGOGASTRODUODENOSCOPY (EGD) WITH PROPOFOL;  Surgeon: [name], MD;  Location: [facility];  Service: Gastroenterology;  Laterality: N/A;  . ICD IMPLANT  04/04/2019  . ICD IMPLANT N/A 04/04/2019   Procedure: ICD IMPLANT;  Surgeon: Constance Haw, MD;  Location: [facility];  Service: Cardiovascular;  Laterality: N/A;  . LEFT HEART CATHETERIZATION WITH CORONARY ANGIOGRAM N/A 10/07/2013   Procedure: LEFT HEART CATHETERIZATION WITH CORONARY ANGIOGRAM;  Surgeon: Leonie Man, MD;  Location: [facility];  Service: Cardiovascular;  Laterality: N/A;  . RIGHT HEART CATH N/A 11/06/2018   Procedure: RIGHT HEART CATH;  Surgeon: Larey Dresser, MD;  Location: [facility];  Service: Cardiovascular;  Laterality: N/A;  . RIGHT/LEFT HEART CATH AND CORONARY ANGIOGRAPHY N/A 02/12/2019   Procedure: RIGHT/LEFT HEART CATH AND CORONARY ANGIOGRAPHY;  Surgeon: Larey Dresser, MD;  Location: [place] [facility];  Service: Cardiovascular;  Laterality: N/A;    Family History  Problem Relation Age of Onset  . Stroke Mother   . Hypertension Mother   . Colon  cancer Father   . Diabetes Father   . Heart attack Father   . Sarcoidosis Other   . Breast cancer Paternal Aunt   . Emphysema Brother   . Lung disease Brother        Unknown type, 3 brothers, one with liver and lung disease  . Diabetes Brother   . Asthma Paternal Aunt   . Diabetes Sister     Social History:  reports that she quit smoking about a year ago. Her smoking use included cigarettes. She has a 10.00 pack-year smoking history. She has never used smokeless tobacco. She reports previous drug use. Drug: Cocaine. She reports that she does not drink alcohol.   Review of Systems  TSH was abnormal in 5/20 but now normal without supplement  Lab Results  Component Value Date   TSH 3.11 05/08/2019   TSH 5.177 (H)  10/28/2018   TSH 1.816 05/30/2018   FREET4 0.83 05/08/2019     Lipid history: Has been treated with Crestor 40 mg daily, has history of CAD    Lab Results  Component Value Date   CHOL 137 02/07/2019   HDL 56 02/07/2019   LDLCALC 52 02/07/2019   TRIG 145 02/07/2019   CHOLHDL 2.4 02/07/2019           Hypertension: Treated with bisoprolol, also CHF has been present CHF treated with spironolactone and torsemide as well as isosorbide/hydralazine  BP Readings from Last 3 Encounters:  05/17/19 124/60  05/04/19 106/60  05/04/19 106/60   She has chronic renal insufficiency without microalbuminuria  Lab Results  Component Value Date   CREATININE 1.51 (H) 05/08/2019   CREATININE 1.48 (H) 03/19/2019   CREATININE 1.44 (H) 03/15/2019    Most recent eye exam was in 10/2018 with [facility], unknown status of retinopathy  Most recent foot exam: 8/20  Currently known complications of diabetes: Neuropathy, microalbuminuria  Other active problems are history of coronary artery disease, cardiomyopathy and COPD She is using oxygen  LABS:  No visits with results within 1 Week(s) from this visit.  Latest known visit with results is:  Lab on 05/08/2019  Component Date Value Ref Range Status  . Fructosamine 05/08/2019 261  0 - 285 umol/L Final   Comment: Published reference interval for apparently healthy subjects between age 16 and 61 is 41 - 285 umol/L and in a poorly controlled diabetic population is 228 - 563 umol/L with a mean of 396 umol/L.   [name] Kitchen Sodium 05/08/2019 140  135 - 145 mEq/L Final  . Potassium 05/08/2019 3.8  3.5 - 5.1 mEq/L Final  . Chloride 05/08/2019 100  96 - 112 mEq/L Final  . CO2 05/08/2019 35* 19 - 32 mEq/L Final  . Glucose, Bld 05/08/2019 73  70 - 99 mg/dL Final  . BUN 05/08/2019 29* 6 - 23 mg/dL Final  . Creatinine, Ser 05/08/2019 1.51* 0.40 - 1.20 mg/dL Final  . Total Bilirubin 05/08/2019 0.2  0.2 - 1.2 mg/dL Final  . Alkaline Phosphatase 05/08/2019 127* 39  - 117 U/L Final  . AST 05/08/2019 19  0 - 37 U/L Final  . ALT 05/08/2019 17  0 - 35 U/L Final  . Total Protein 05/08/2019 8.1  6.0 - 8.3 g/dL Final  . Albumin 05/08/2019 3.7  3.5 - 5.2 g/dL Final  . GFR 05/08/2019 42.61* >60.00 mL/min Final  . Calcium 05/08/2019 9.5  8.4 - 10.5 mg/dL Final  . Free T4 05/08/2019 0.83  0.60 - 1.60 ng/dL Final   Comment:  Specimens from patients who are undergoing biotin therapy and /or ingesting biotin supplements may contain high levels of biotin.  The higher biotin concentration in these specimens interferes with this Free T4 assay.  Specimens that contain high levels  of biotin may cause false high results for this Free T4 assay.  Please interpret results in light of the total clinical presentation of the patient.    [name] Kitchen TSH 05/08/2019 3.11  0.35 - 4.50 uIU/mL Final  . Color, Urine 05/08/2019 YELLOW  Yellow;Lt. Yellow;Straw;Dark Yellow;Amber;Green;Red;Brown Final  . APPearance 05/08/2019 CLEAR  Clear;Turbid;Slightly Cloudy;Cloudy Final  . Specific Gravity, Urine 05/08/2019 1.020  1.000 - 1.030 Final  . pH 05/08/2019 6.0  5.0 - 8.0 Final  . Total Protein, Urine 05/08/2019 30* Negative Final  . Urine Glucose 05/08/2019 NEGATIVE  Negative Final  . Ketones, ur 05/08/2019 NEGATIVE  Negative Final  . Bilirubin Urine 05/08/2019 NEGATIVE  Negative Final  . Hgb urine dipstick 05/08/2019 NEGATIVE  Negative Final  . Urobilinogen, UA 05/08/2019 0.2  0.0 - 1.0 Final  . Leukocytes,Ua 05/08/2019 NEGATIVE  Negative Final  . Nitrite 05/08/2019 NEGATIVE  Negative Final  . WBC, UA 05/08/2019 3-6/hpf* 0-2/hpf Final  . RBC / HPF 05/08/2019 none seen  0-2/hpf Final  . Squamous Epithelial / LPF 05/08/2019 Few(5-10/hpf)* Rare(0-4/hpf) Final  . Microalb, Ur 05/08/2019 9.4* 0.0 - 1.9 mg/dL Final  . Creatinine,U 05/08/2019 131.6  mg/dL Final  . Microalb Creat Ratio 05/08/2019 7.1  0.0 - 30.0 mg/g Final    Physical Examination:  LMP 07/28/2013 Comment: perimenopausal  Exam not  done, virtual visit   ASSESSMENT:  Diabetes type 2  See history of present illness for detailed discussion of current diabetes management, blood sugar patterns and problems identified  Last A1c was 8.8  However fructosamine of 261 indicates improved control  Current treatment regimen is basal bolus insulin regimen and Victoza  Likely is having better control with increasing basal insulin and also using [place] instead of Levemir Suppertime coverage has been increased She again appears to have the highest readings fasting This is despite taking 60 units of Tresiba now  Some of her high sugars in the morning are related to late night snacks Also daytime readings are variable based on her intake Not enough monitoring being done overall especially after meals  History of abnormal TSH in 09/2018, now normal    PLAN:    1. Glucose monitoring: . Patient advised to check more blood sugars 2 hours after meals on a regular basis at least 2 times a day and bring monitor for download on each visit.  Discussed blood sugar targets both fasting and after meals  2.  Diabetes education: . Patient will need consultation with dietitian  3.  Lifestyle changes: . Dietary changes: Limit high carbohydrates in meals and snacks and avoid high-fat snacks especially late at night . Exercise regimen: She needs to consider chair exercises, she will discuss with diabetes educator when she is able to come in  4.  Medication changes needed: . Increase Tresiba to 64 units . Additional 4 to 6 units at least for late evening snacks unless low carbohydrate, this may also improve her overnight readings . Stay on Victoza 1.8 mg . We will need to adjust her suppertime NovoLog if blood sugars are consistently higher after supper . Also evaluate postprandial readings after breakfast and lunch to adjust coverage for mealtime spikes . If her renal function improves may be also a candidate for an SGLT2 drug  5.  Preventive care needed:  . Urine microalbumin, full eye exam  6.  Follow-up: 6 weeks with A1c   7.  She will discuss her renal dysfunction with her PCP   There are no Patient Instructions on file for this visit.  Counseling time on subjects discussed in assessment and plan sections is over 50% of today's 25 minute visit    Elayne Snare 05/18/2019, 10:14 AM   Note: This office note was prepared with Dragon voice recognition system technology. Any transcriptional errors that result from this process are unintentional.

## 2019-05-23 ENCOUNTER — Telehealth (HOSPITAL_COMMUNITY): Payer: Self-pay

## 2019-05-23 NOTE — Telephone Encounter (Signed)
Pt called to reschedule appt on Monday as she does not have transportation. Advised next appt not until January. Pt ok with January 19th appointment. PT also asking for letter that states she needs an escort with her each Dr appts because she uses wheelchair.  Letter faxed to transportation company at [phone]

## 2019-05-28 ENCOUNTER — Encounter (HOSPITAL_COMMUNITY): Payer: Medicaid Other | Admitting: Cardiology

## 2019-05-29 ENCOUNTER — Encounter: Payer: Self-pay | Admitting: Pulmonary Disease

## 2019-05-29 ENCOUNTER — Telehealth (INDEPENDENT_AMBULATORY_CARE_PROVIDER_SITE_OTHER): Payer: Medicaid Other | Admitting: Pulmonary Disease

## 2019-05-29 ENCOUNTER — Telehealth (HOSPITAL_COMMUNITY): Payer: Self-pay

## 2019-05-29 ENCOUNTER — Other Ambulatory Visit: Payer: Self-pay | Admitting: Internal Medicine

## 2019-05-29 DIAGNOSIS — J449 Chronic obstructive pulmonary disease, unspecified: Secondary | ICD-10-CM | POA: Diagnosis not present

## 2019-05-29 DIAGNOSIS — G4733 Obstructive sleep apnea (adult) (pediatric): Secondary | ICD-10-CM

## 2019-05-29 MED ORDER — ANORO ELLIPTA 62.5-25 MCG/INH IN AEPB: 1.0000 | INHALATION_SPRAY | Freq: Every day | RESPIRATORY_TRACT | 0 refills | Status: DC

## 2019-05-29 NOTE — Patient Instructions (Signed)
Call DME to see what help they can offer with respect to mask fitting  Authorization for Anoro

## 2019-05-29 NOTE — Telephone Encounter (Signed)
Tamara Silva called me today to advise that she needs help getting samples from her pulmonologist. I advised that I would not be able to get it for her today but I would get it for her tomorrow and bring it by. She was understanding and agreeable.   Jacquiline Doe, EMT 05/29/19

## 2019-05-29 NOTE — Addendum Note (Signed)
Addended by: Hildred Alamin I on: 05/29/2019 04:50 PM   Modules accepted: Orders

## 2019-05-29 NOTE — Progress Notes (Signed)
Virtual Visit via Video Note  I connected with Tamara Silva on 05/29/19 at  2:30 PM EST by a video enabled telemedicine application and verified that I am speaking with the correct person using two identifiers.  Location: Patient: Tamara Silva Provider: Adrian Prince discussed the limitations of evaluation and management by telemedicine and the availability of in person appointments. The patient expressed understanding and agreed to proceed.  History of Present Illness: Make connection with patient over video  Has not been using Anoro-states he needs preauthorization -We will give her some samples and try and get Anoro authorized  Has not been able to tolerate CPAP -DME was just at her house to help adjust the CPAP - she has 2 different interfaces and both are not working well -She uses oxygen supplementation around-the-clock  We will try and get a DME to assist with tolerance  She states she otherwise has been well Encouraged to get more active   Observations/Objective: She does appear comfortable on the phone, has oxygen supplementation in place  Assessment and Plan: COPD -Continue bronchodilators  Chronic hypoxemic respiratory failure -Continue oxygen supplementation  Obstructive sleep apnea -Intolerance to CPAP at present -Still having significant difficulty with mask fit  Follow Up Instructions: We will assist with Anoro samples and try and facilitate authorization of Anoro   I discussed the assessment and treatment plan with the patient. The patient was provided an opportunity to ask questions and all were answered. The patient agreed with the plan and demonstrated an understanding of the instructions.   The patient was advised to call back or seek an in-person evaluation if the symptoms worsen or if the condition fails to improve as anticipated.  I provided 15 minutes of non-face-to-face time during this encounter.   Laurin Coder, MD

## 2019-05-30 ENCOUNTER — Other Ambulatory Visit: Payer: Self-pay

## 2019-05-30 ENCOUNTER — Telehealth: Payer: Self-pay | Admitting: Pulmonary Disease

## 2019-05-30 ENCOUNTER — Other Ambulatory Visit (HOSPITAL_COMMUNITY): Payer: Self-pay

## 2019-05-30 DIAGNOSIS — IMO0002 Reserved for concepts with insufficient information to code with codable children: Secondary | ICD-10-CM

## 2019-05-30 DIAGNOSIS — E1142 Type 2 diabetes mellitus with diabetic polyneuropathy: Secondary | ICD-10-CM

## 2019-05-30 MED ORDER — NOVOLOG FLEXPEN 100 UNIT/ML ~~LOC~~ SOPN: PEN_INJECTOR | SUBCUTANEOUS | 0 refills | Status: DC

## 2019-05-30 NOTE — Telephone Encounter (Signed)
Spoke with Tamara Silva. She was talking to [facility] about samples per previous note, Thedore Mins will be by pulmonary 05/30/19 to pick up samples. Nothing further is needed. ATC patient unable to reach. Left detailed message on phone per [facility]

## 2019-05-30 NOTE — Progress Notes (Signed)
Paramedicine Encounter    Patient ID: Tamara Silva, female    DOB: Jun 13, 1960, 58 y.o.   MRN: [id number]   Patient Care Team: Ladell Pier, MD as PCP - General (Internal Medicine) Larey Dresser, MD as PCP - Advanced Heart Failure (Cardiology) Constance Haw, MD as PCP - Electrophysiology (Cardiology) Sueanne Margarita, MD as PCP - Cardiology (Cardiology)  Patient Active Problem List   Diagnosis Date Noted  . Chronic systolic (congestive) heart failure ([place]) 04/04/2019  . Diabetic peripheral neuropathy ([place]) 01/19/2019  . Former smoker 01/19/2019  . CKD (chronic kidney disease) stage 3, GFR 30-59 ml/min 01/19/2019  . Cardiac arrest ([place]) 10/28/2018  . Seizure ([place]) 10/28/2018  . Noncompliance with treatment plan 07/24/2018  . General patient noncompliance 07/24/2018  . Noncompliance with diet and medication regimen 07/24/2018  . Dysarthria   . BiPAP (biphasic positive airway pressure) dependence   . Pancytopenia ([place]) 07/08/2018  . Type II diabetes mellitus with renal manifestations ([place]) 07/08/2018  . Hepatic steatosis 06/22/2018  . Chronic respiratory failure with hypoxia and hypercapnia (HCC)   . On home O2   . Slow transit constipation   . Cocaine abuse ([place])   . Diabetes mellitus type 2 in obese ([place])   . Generalized tonic-clonic seizure ([place]) 06/05/2018  . Cerebral embolism with cerebral infarction 06/01/2018  . Pressure injury of skin 05/30/2018  . Iron deficiency anemia 01/27/2018  . Tobacco abuse disorder 10/03/2017  . Dyslipidemia 04/29/2016  . Primary insomnia 04/29/2016  . Obesity hypoventilation syndrome ([place])   . OSA (obstructive sleep apnea)   . Dyslipidemia associated with type 2 diabetes mellitus ([place]) 05/21/2015  . COPD GOLD III  06/10/2014  . CAD S/P percutaneous coronary angioplasty - prior PCI to LAD; RCA PCI: new Xience Alpine DES 2.75 mm x 15 mm  10/07/2013  . Morbid obesity ([place]) 07/30/2013  . Chronic combined systolic and diastolic  heart failure ([place]) 07/10/2013  . Chronic pulmonary embolism ([place]) 02/08/2013  . Bipolar disorder ([place]) 02/08/2013  . Essential hypertension 02/08/2013  . COPD (chronic obstructive pulmonary disease) ([place]) 10/15/2012  . Pulmonary embolism on right ([place]) 10/15/2012    Current Outpatient Medications:  .  ACCU-CHEK AVIVA PLUS test strip, USE 1 STRIP TO CHECK BLOOD GLUCOSE 3 TIMES A DAY, Disp: 100 each, Rfl: 2 .  acetaminophen (TYLENOL) 500 MG tablet, Take 500 mg by mouth every 6 (six) hours as needed for moderate pain or headache., Disp: , Rfl:  .  albuterol (PROAIR HFA) 108 (90 Base) MCG/ACT inhaler, INHALE 2 PUFFS BY MOUTH 4 TIMES DAILY AS NEEDED (Patient taking differently: Inhale 2 puffs into the lungs every 4 (four) hours as needed. INHALE 2 PUFFS BY MOUTH 4 TIMES DAILY AS NEEDED), Disp: 18 g, Rfl: 11 .  aspirin EC 81 MG tablet, Take 1 tablet (81 mg total) by mouth daily., Disp: 90 tablet, Rfl: 3 .  bisoprolol (ZEBETA) 5 MG tablet, Take 1 tablet (5 mg total) by mouth daily., Disp: 30 tablet, Rfl: 6 .  ferrous sulfate 325 (65 FE) MG tablet, Take 325 mg by mouth daily with breakfast., Disp: , Rfl:  .  gabapentin (NEURONTIN) 300 MG capsule, Take 1 capsule (300 mg total) by mouth 3 (three) times daily. Start with 1 capsule at bedtime for 7 days then increase to 3 times a day if tolerated, Disp: 90 capsule, Rfl: 3 .  hydrocerin (EUCERIN) CREA, Apply 1 application topically 2 (two) times daily., Disp: , Rfl: 0 .  ipratropium-albuterol (DUONEB)  0.5-2.5 (3) MG/3ML SOLN, Use nebulizer to inhale contents of one vial every 6 hours as needed for shortness of breath, wheezing (Patient taking differently: Inhale 3 mLs into the lungs every 6 (six) hours as needed (shortness of breath and wheezing). ), Disp: 360 mL, Rfl: 11 .  isosorbide-hydrALAZINE (BIDIL) 20-37.5 MG tablet, Take 1 tablet by mouth 3 (three) times daily., Disp: 90 tablet, Rfl: 6 .  liraglutide (VICTOZA) 18 MG/3ML SOPN, INJECT 1.8 MG INTO THE  SKIN DAILY., Disp: 9 mL, Rfl: 2 .  NEEDLE, DISP, 26 G 26G X 1/2" MISC, 1 application by Does not apply route daily., Disp: 100 each, Rfl: 0 .  NOVOLOG FLEXPEN 100 UNIT/ML FlexPen, INJECT 18 UNITS BEFORE dinner as instructed by Dr. Dwyane Dee., Disp: 15 mL, Rfl: 0 .  omeprazole (PRILOSEC) 40 MG capsule, TAKE 1 CAPSULE (40 MG TOTAL) BY MOUTH DAILY., Disp: 30 capsule, Rfl: 2 .  PAZEO 0.7 % SOLN, Place 1 drop into both eyes daily. , Disp: , Rfl:  .  potassium chloride SA (KLOR-CON) 20 MEQ tablet, TAKE 1 TABLET (20 MEQ TOTAL) BY MOUTH DAILY., Disp: 30 tablet, Rfl: 3 .  RESTASIS 0.05 % ophthalmic emulsion, Place 1 drop into both eyes 2 (two) times daily. , Disp: , Rfl:  .  rosuvastatin (CRESTOR) 40 MG tablet, TAKE 1 TABLET (40 MG TOTAL) BY MOUTH DAILY AT 6 PM., Disp: 30 tablet, Rfl: 3 .  spironolactone (ALDACTONE) 25 MG tablet, TAKE 1 TABLET (25 MG TOTAL) BY MOUTH DAILY., Disp: 90 tablet, Rfl: 1 .  torsemide (DEMADEX) 20 MG tablet, Take 3 tablets (60 mg total) by mouth 2 (two) times daily., Disp: 180 tablet, Rfl: 3 .  TRESIBA FLEXTOUCH 200 UNIT/ML SOPN, INJECT 60 UNITS INTO THE SKIN DAILY. (Patient taking differently: Inject 60 Units into the skin daily. 60-64 units), Disp: 15 mL, Rfl: 1 .  umeclidinium-vilanterol (ANORO ELLIPTA) 62.5-25 MCG/INH AEPB, Inhale 1 puff into the lungs daily. (Patient not taking: Reported on 05/29/2019), Disp: 1 each, Rfl: 5 .  umeclidinium-vilanterol (ANORO ELLIPTA) 62.5-25 MCG/INH AEPB, Inhale 1 puff into the lungs daily., Disp: 21 each, Rfl: 0 .  vitamin B-12 (CYANOCOBALAMIN) 1000 MCG tablet, Take 1,000 mcg by mouth daily., Disp: , Rfl:  Allergies  Allergen Reactions  . Metolazone Other (See Comments)    Dizziness and falling  . Plavix [Clopidogrel Bisulfate] Other (See Comments)    High PRU's - non-responder  . Ibuprofen Other (See Comments)    Pt states Tamara Silva is not supposed to take ibuprofen because of other meds Tamara Silva is taking  . Nsaids Other (See Comments)    "I do not  take NSAIDs d/t interfering with other meds"      Social History   Socioeconomic History  . Marital status: Single    Spouse name: Not on file  . Number of children: 1  . Years of education: Not on file  . Highest education level: Not on file  Occupational History  . Not on file  Tobacco Use  . Smoking status: Former Smoker    Packs/day: 0.50    Years: 20.00    Pack years: 10.00    Types: Cigarettes    Quit date: 05/31/2018    Years since quitting: 0.9  . Smokeless tobacco: Never Used  . Tobacco comment: smoked off and on x 20 years  Substance and Sexual Activity  . Alcohol use: No    Alcohol/week: 0.0 standard drinks  . Drug use: Not Currently    Types: Cocaine  .  Sexual activity: Not Currently  Other Topics Concern  . Not on file  Social History Narrative   Origibnally from [place]   Most recently from [name]   Daughter lives in town      On Social security to CHF, COPD, CAD   Social Determinants of Health   Financial Resource Strain:   . Difficulty of Paying Living Expenses: Not on file  Food Insecurity:   . Worried About Charity fundraiser in the Last Year: Not on file  . Ran Out of Food in the Last Year: Not on file  Transportation Needs:   . Lack of Transportation (Medical): Not on file  . Lack of Transportation (Non-Medical): Not on file  Physical Activity:   . Days of Exercise per Week: Not on file  . Minutes of Exercise per Session: Not on file  Stress:   . Feeling of Stress : Not on file  Social Connections:   . Frequency of Communication with Friends and Family: Not on file  . Frequency of Social Gatherings with Friends and Family: Not on file  . Attends Religious Services: Not on file  . Active Member of Clubs or Organizations: Not on file  . Attends Archivist Meetings: Not on file  . Marital Status: Not on file  Intimate Partner Violence: Unknown  . Fear of Current or Ex-Partner: Patient refused  . Emotionally Abused: Patient  refused  . Physically Abused: Patient refused  . Sexually Abused: Patient refused    Physical Exam Cardiovascular:     Rate and Rhythm: Normal rate and regular rhythm.     Pulses: Normal pulses.  Pulmonary:     Effort: Pulmonary effort is normal.     Breath sounds: Normal breath sounds.  Musculoskeletal:        General: Normal range of motion.     Right lower leg: No edema.     Left lower leg: No edema.  Skin:    General: Skin is warm and dry.     Capillary Refill: Capillary refill takes less than 2 seconds.  Neurological:     Mental Status: Tamara Silva is alert and oriented to person, place, and time.  Psychiatric:        Mood and Affect: Mood normal.         Future Appointments  Date Time Provider [place]  06/11/2019 11:00 AM [facility] ONLY [facility] None  06/11/2019 11:30 AM Orson Slick, MD [facility] None  06/19/2019 10:40 AM Larey Dresser, MD [facility] None  07/02/2019 11:10 AM Ladell Pier, MD [facility] None  07/05/2019  7:05 AM [facility] DEVICE REMOTES [facility]  07/10/2019  2:30 PM Camnitz, Ocie Doyne, MD [facility]    BP (!) 107/52 (BP Location: Left Arm, Patient Position: Supine, Cuff Size: Normal)   Pulse 74   Resp 16   LMP 07/28/2013 Comment: perimenopausal  SpO2 98%   Weight yesterday- did not weigh Last visit weight- 292 lb  Tamara Silva was seen at home today. Tamara Silva denied chest pain, SOB, headache, dizziness, orthopnea, fever or cough. Tamara Silva did say Tamara Silva was feeling a little under the weather though Tamara Silva was not descriptive. When I pushed the issue Tamara Silva stated Tamara Silva has forgotten to eat lunch before taking her medications this afternoon. Tamara Silva stated Tamara Silva has been compliant with her medications with the exception of Novolog and potassium which Tamara Silva was out of and awaiting delivery, respectively. Tamara Silva stated Tamara Silva has been trying to reach her  endocrinologist because the pharmacy does not an updated prescription of the Novolog.  I advised I would contact Dr Dwyane Dee about this issue but Tamara Silva stated Tamara Silva would reach out on her own. I delivered samples from her pulmonologist but nothing further was necessary today. I will follow up in two weeks.   Jacquiline Doe, EMT 05/30/19  ACTION: Home visit completed Next visit planned for 1 week

## 2019-06-05 ENCOUNTER — Telehealth: Payer: Self-pay | Admitting: Hematology and Oncology

## 2019-06-05 NOTE — Telephone Encounter (Signed)
R/s appt per 1/5 sch message - pt aware of appt date and time

## 2019-06-11 ENCOUNTER — Telehealth: Payer: Self-pay | Admitting: Hematology and Oncology

## 2019-06-11 ENCOUNTER — Inpatient Hospital Stay: Payer: Medicaid Other

## 2019-06-11 ENCOUNTER — Inpatient Hospital Stay: Payer: Medicaid Other | Admitting: Hematology and Oncology

## 2019-06-11 NOTE — Telephone Encounter (Signed)
Returned patient's phone call regarding rescheduling an appointment, per patient's request due to transportation appointment has moved to 01/22.

## 2019-06-13 ENCOUNTER — Inpatient Hospital Stay: Payer: Medicaid Other | Admitting: Hematology and Oncology

## 2019-06-13 ENCOUNTER — Inpatient Hospital Stay: Payer: Medicaid Other

## 2019-06-14 ENCOUNTER — Telehealth (HOSPITAL_COMMUNITY): Payer: Self-pay

## 2019-06-15 NOTE — Telephone Encounter (Signed)
I called Tamara Silva to schedule an appointment. She stated she did not need anything at this time and requested that I call her next week and see if she needs a visit.   Tamara Silva, EMT

## 2019-06-19 ENCOUNTER — Ambulatory Visit (HOSPITAL_COMMUNITY)
Admission: RE | Admit: 2019-06-19 | Discharge: 2019-06-19 | Disposition: A | Discharge status: Home / Self Care | Payer: Medicaid Other | Source: Ambulatory Visit | Attending: Cardiology | Admitting: Cardiology

## 2019-06-19 ENCOUNTER — Other Ambulatory Visit (HOSPITAL_COMMUNITY): Payer: Self-pay

## 2019-06-19 ENCOUNTER — Telehealth (HOSPITAL_COMMUNITY): Payer: Self-pay | Admitting: Pharmacist

## 2019-06-19 ENCOUNTER — Other Ambulatory Visit: Payer: Self-pay

## 2019-06-19 DIAGNOSIS — I5022 Chronic systolic (congestive) heart failure: Secondary | ICD-10-CM | POA: Diagnosis not present

## 2019-06-19 MED ORDER — SACUBITRIL-VALSARTAN 24-26 MG PO TABS: 1.0000 | ORAL_TABLET | Freq: Two times a day (BID) | ORAL | 5 refills | Status: DC

## 2019-06-19 NOTE — Patient Instructions (Addendum)
START Entresto 24/26mg  (1 tab) twice a day   CONTINUE Torsemide 60mg  ( 3 tabs) twice a day   Labs in 2 weeks: Tuesday February 2nd, 2021 at 11am  We will only contact you if something comes back abnormal or we need to make some changes. Otherwise no news is good news!   Your physician recommends that you schedule a follow-up appointment in: 4-6 weeks with Dr Aundra Dubin.  You are scheduled for a visit on February 23rd, 2021 at 11:20am   Please call office at [phone] option [phone] if you have any questions or concerns.     At the [facility], you and your health needs are our priority. As part of our continuing mission to provide you with exceptional heart care, we have created designated Provider Care Teams. These Care Teams include your primary Cardiologist (physician) and Advanced Practice Providers (APPs- Physician Assistants and Nurse Practitioners) who all work together to provide you with the care you need, when you need it.   You may see any of the following providers on your designated Care Team at your next follow up: [name] Kitchen Dr Glori Bickers . Dr Loralie Champagne . Darrick Grinder, NP . Lyda Jester, PA . Audry Riles, PharmD   Please be sure to bring in all your medications bottles to every appointment.

## 2019-06-19 NOTE — Progress Notes (Signed)
Paramedicine Encounter    Patient ID: Tamara Silva, female    DOB: 12-02-1960, 60 y.o.   MRN: [id number]   Patient Care Team: Ladell Pier, MD as PCP - General (Internal Medicine) Larey Dresser, MD as PCP - Advanced Heart Failure (Cardiology) Constance Haw, MD as PCP - Electrophysiology (Cardiology) Sueanne Margarita, MD as PCP - Cardiology (Cardiology)  Patient Active Problem List   Diagnosis Date Noted  . Chronic systolic (congestive) heart failure ([place]) 04/04/2019  . Diabetic peripheral neuropathy ([place]) 01/19/2019  . Former smoker 01/19/2019  . CKD (chronic kidney disease) stage 3, GFR 30-59 ml/min 01/19/2019  . Cardiac arrest ([place]) 10/28/2018  . Seizure ([place]) 10/28/2018  . Noncompliance with treatment plan 07/24/2018  . General patient noncompliance 07/24/2018  . Noncompliance with diet and medication regimen 07/24/2018  . Dysarthria   . BiPAP (biphasic positive airway pressure) dependence   . Pancytopenia ([place]) 07/08/2018  . Type II diabetes mellitus with renal manifestations ([place]) 07/08/2018  . Hepatic steatosis 06/22/2018  . Chronic respiratory failure with hypoxia and hypercapnia (HCC)   . On home O2   . Slow transit constipation   . Cocaine abuse ([place])   . Diabetes mellitus type 2 in obese ([place])   . Generalized tonic-clonic seizure ([place]) 06/05/2018  . Cerebral embolism with cerebral infarction 06/01/2018  . Pressure injury of skin 05/30/2018  . Iron deficiency anemia 01/27/2018  . Tobacco abuse disorder 10/03/2017  . Dyslipidemia 04/29/2016  . Primary insomnia 04/29/2016  . Obesity hypoventilation syndrome ([place])   . OSA (obstructive sleep apnea)   . Dyslipidemia associated with type 2 diabetes mellitus ([place]) 05/21/2015  . COPD GOLD III  06/10/2014  . CAD S/P percutaneous coronary angioplasty - prior PCI to LAD; RCA PCI: new Xience Alpine DES 2.75 mm x 15 mm  10/07/2013  . Morbid obesity ([place]) 07/30/2013  . Chronic combined systolic and diastolic  heart failure ([place]) 07/10/2013  . Chronic pulmonary embolism ([place]) 02/08/2013  . Bipolar disorder ([place]) 02/08/2013  . Essential hypertension 02/08/2013  . COPD (chronic obstructive pulmonary disease) ([place]) 10/15/2012  . Pulmonary embolism on right ([place]) 10/15/2012    Current Outpatient Medications:  .  ACCU-CHEK AVIVA PLUS test strip, USE 1 STRIP TO CHECK BLOOD GLUCOSE 3 TIMES A DAY, Disp: 100 each, Rfl: 2 .  acetaminophen (TYLENOL) 500 MG tablet, Take 500 mg by mouth every 6 (six) hours as needed for moderate pain or headache., Disp: , Rfl:  .  albuterol (PROAIR HFA) 108 (90 Base) MCG/ACT inhaler, INHALE 2 PUFFS BY MOUTH 4 TIMES DAILY AS NEEDED (Patient taking differently: Inhale 2 puffs into the lungs every 4 (four) hours as needed. INHALE 2 PUFFS BY MOUTH 4 TIMES DAILY AS NEEDED), Disp: 18 g, Rfl: 11 .  aspirin EC 81 MG tablet, Take 1 tablet (81 mg total) by mouth daily., Disp: 90 tablet, Rfl: 3 .  bisoprolol (ZEBETA) 5 MG tablet, Take 1 tablet (5 mg total) by mouth daily., Disp: 30 tablet, Rfl: 6 .  ferrous sulfate 325 (65 FE) MG tablet, Take 325 mg by mouth daily with breakfast., Disp: , Rfl:  .  gabapentin (NEURONTIN) 300 MG capsule, Take 1 capsule (300 mg total) by mouth 3 (three) times daily. Start with 1 capsule at bedtime for 7 days then increase to 3 times a day if tolerated, Disp: 90 capsule, Rfl: 3 .  hydrocerin (EUCERIN) CREA, Apply 1 application topically 2 (two) times daily., Disp: , Rfl: 0 .  ipratropium-albuterol (DUONEB)  0.5-2.5 (3) MG/3ML SOLN, Use nebulizer to inhale contents of one vial every 6 hours as needed for shortness of breath, wheezing (Patient taking differently: Inhale 3 mLs into the lungs every 6 (six) hours as needed (shortness of breath and wheezing). ), Disp: 360 mL, Rfl: 11 .  isosorbide-hydrALAZINE (BIDIL) 20-37.5 MG tablet, Take 1 tablet by mouth 3 (three) times daily., Disp: 90 tablet, Rfl: 6 .  liraglutide (VICTOZA) 18 MG/3ML SOPN, INJECT 1.8 MG INTO THE  SKIN DAILY., Disp: 9 mL, Rfl: 2 .  NEEDLE, DISP, 26 G 26G X 1/2" MISC, 1 application by Does not apply route daily., Disp: 100 each, Rfl: 0 .  NOVOLOG FLEXPEN 100 UNIT/ML FlexPen, Inject 14 units under the skin at breakfast and lunch and 18 at dinner. May also take additional 4-6 at bedtime for with snacks., Disp: 30 mL, Rfl: 0 .  omeprazole (PRILOSEC) 40 MG capsule, TAKE 1 CAPSULE (40 MG TOTAL) BY MOUTH DAILY., Disp: 30 capsule, Rfl: 2 .  PAZEO 0.7 % SOLN, Place 1 drop into both eyes daily. , Disp: , Rfl:  .  potassium chloride SA (KLOR-CON) 20 MEQ tablet, TAKE 1 TABLET (20 MEQ TOTAL) BY MOUTH DAILY., Disp: 30 tablet, Rfl: 3 .  RESTASIS 0.05 % ophthalmic emulsion, Place 1 drop into both eyes 2 (two) times daily. , Disp: , Rfl:  .  rosuvastatin (CRESTOR) 40 MG tablet, TAKE 1 TABLET (40 MG TOTAL) BY MOUTH DAILY AT 6 PM., Disp: 30 tablet, Rfl: 3 .  spironolactone (ALDACTONE) 25 MG tablet, TAKE 1 TABLET (25 MG TOTAL) BY MOUTH DAILY., Disp: 90 tablet, Rfl: 1 .  torsemide (DEMADEX) 20 MG tablet, Take 3 tablets (60 mg total) by mouth 2 (two) times daily., Disp: 180 tablet, Rfl: 3 .  TRESIBA FLEXTOUCH 200 UNIT/ML SOPN, INJECT 60 UNITS INTO THE SKIN DAILY. (Patient taking differently: Inject 60 Units into the skin daily. 60-64 units), Disp: 15 mL, Rfl: 1 .  umeclidinium-vilanterol (ANORO ELLIPTA) 62.5-25 MCG/INH AEPB, Inhale 1 puff into the lungs daily., Disp: 1 each, Rfl: 5 .  umeclidinium-vilanterol (ANORO ELLIPTA) 62.5-25 MCG/INH AEPB, Inhale 1 puff into the lungs daily., Disp: 21 each, Rfl: 0 .  vitamin B-12 (CYANOCOBALAMIN) 1000 MCG tablet, Take 1,000 mcg by mouth daily., Disp: , Rfl:  Allergies  Allergen Reactions  . Metolazone Other (See Comments)    Dizziness and falling  . Plavix [Clopidogrel Bisulfate] Other (See Comments)    High PRU's - non-responder  . Ibuprofen Other (See Comments)    Pt states she is not supposed to take ibuprofen because of other meds she is taking  . Nsaids Other (See  Comments)    "I do not take NSAIDs d/t interfering with other meds"      Social History   Socioeconomic History  . Marital status: Single    Spouse name: Not on file  . Number of children: 1  . Years of education: Not on file  . Highest education level: Not on file  Occupational History  . Not on file  Tobacco Use  . Smoking status: Former Smoker    Packs/day: 0.50    Years: 20.00    Pack years: 10.00    Types: Cigarettes    Quit date: 05/31/2018    Years since quitting: 1.0  . Smokeless tobacco: Never Used  . Tobacco comment: smoked off and on x 20 years  Substance and Sexual Activity  . Alcohol use: No    Alcohol/week: 0.0 standard drinks  . Drug  use: Not Currently    Types: Cocaine  . Sexual activity: Not Currently  Other Topics Concern  . Not on file  Social History Narrative   Origibnally from [place]   Most recently from [place]   Daughter lives in town      On Social security to CHF, COPD, CAD   Social Determinants of Health   Financial Resource Strain:   . Difficulty of Paying Living Expenses: Not on file  Food Insecurity:   . Worried About Charity fundraiser in the Last Year: Not on file  . Ran Out of Food in the Last Year: Not on file  Transportation Needs:   . Lack of Transportation (Medical): Not on file  . Lack of Transportation (Non-Medical): Not on file  Physical Activity:   . Days of Exercise per Week: Not on file  . Minutes of Exercise per Session: Not on file  Stress:   . Feeling of Stress : Not on file  Social Connections:   . Frequency of Communication with Friends and Family: Not on file  . Frequency of Social Gatherings with Friends and Family: Not on file  . Attends Religious Services: Not on file  . Active Member of Clubs or Organizations: Not on file  . Attends Archivist Meetings: Not on file  . Marital Status: Not on file  Intimate Partner Violence: Unknown  . Fear of Current or Ex-Partner: Patient refused  .  Emotionally Abused: Patient refused  . Physically Abused: Patient refused  . Sexually Abused: Patient refused    Physical Exam Cardiovascular:     Rate and Rhythm: Normal rate and regular rhythm.     Pulses: Normal pulses.  Pulmonary:     Effort: Pulmonary effort is normal.     Breath sounds: Examination of the right-lower field reveals wheezing. Examination of the left-lower field reveals wheezing. Wheezing present.  Musculoskeletal:        General: Normal range of motion.  Skin:    General: Skin is warm and dry.     Capillary Refill: Capillary refill takes less than 2 seconds.  Neurological:     Mental Status: She is alert and oriented to person, place, and time.         Future Appointments  Date Time Provider [place]  06/22/2019 10:00 AM [facility] ONLY [facility] None  06/22/2019 10:30 AM Orson Slick, MD [facility] None  07/02/2019 11:10 AM Ladell Pier, MD [facility] None  07/05/2019  7:05 AM [facility] DEVICE REMOTES [facility]  07/10/2019  2:30 PM Camnitz, Ocie Doyne, MD [facility]    BP (!) 122/58 (BP Location: Left Arm, Patient Position: Sitting, Cuff Size: Normal)   Pulse 74   Resp 16   Wt (!) 302 lb 9.6 oz (137.3 kg)   LMP 07/28/2013 Comment: perimenopausal  SpO2 100%   BMI 46.69 kg/m   Weight yesterday- did not weigh Last visit weight- 292 lb  Ms Proffit was seen at home today prior to her virtual visit with Dr Aundra Dubin. She reported feeling generally well but complained of exertional dyspnea. She denied chest pain, headache, dizziness, orthopnea, fever or cough since our last visit. She reported being medications compliant but her diet had been sodium rich. I stressed the importance of dietary compliance and she expressed understanding. Her medications were verified and I will follow up in two weeks unless it becomes necessary sooner.   Jacquiline Doe, EMT 06/19/19  ACTION: Home visit completed  Next  visit planned for 2 weeks

## 2019-06-19 NOTE — Telephone Encounter (Signed)
Advanced Heart Failure Patient Advocate Encounter  Prior Authorization for Delene Loll has been approved.    PA# [id number] Effective dates: 06/19/2019 - 06/18/2020  Patients co-pay is $3.00  Audry Riles, PharmD, BCPS, BCCP, CPP Heart Failure Clinic Pharmacist [phone]'

## 2019-06-19 NOTE — Progress Notes (Signed)
Heart Failure TeleHealth Note  Due to national recommendations of social distancing due to [place] 19, Audio/video telehealth visit is felt to be most appropriate for this patient at this time.  See MyChart message from today for patient consent regarding telehealth for [facility].  Date:  06/19/2019   ID:  Tamara Silva, DOB [birth year], MRN [id number]  Location: Home  Provider location: [place] [facility] Type of Visit: Established patient   PCP:  Ladell Pier, MD  HF Cardiology: Dr Aundra Dubin  Chief Complaint: Shortness of breath   History of Present Illness: Tamara Silva is a 59 y.o. female who presents via audio/video conferencing for a telehealth visit today.     she denies symptoms worrisome for COVID 19.   Patient has a hx of chronic systolic CHF, HTN, DM-2, HLD, CVA 1/20 and CAD (PCI in 2006 and 2009 - not sure which vessel) and then in 2015 STEMI with Xience DES to distal RCA.  She has had frequent admits for anemia and CHF with transfusions and diuresis. Prasugrel was stopped. No source of bleeding found on EGD and colonoscopy.   She is on home 02 and nocturnal BiPAP for OHS/OSA and COPD.   She no longer smokes.  Hx of PE in 2014.    Hx of bradycardia with Coreg at higher dose.  Echo 05/31/18 with EF 40-45%, PA peak pressure 71 mmHg.    Admitted 10/25/18 with SOB and edema, + productive cough.  This started over a few weeks prior to admission. Hospital course complicated by PEA arrest on 10/28/18. CPR for 25 minutes. Extubated 10/30/18.  Developed AKI.  HF team followed closely to optimize HF.  She was massively volume overloaded and extensively diuresed, weight down 65 lbs total.  Echo showed EF down to 30-35%.  No coronary angiography due to AKI.   Readmitted  11/17/18 with lower extremity edema and volume overload. Diuresed with IV lasix and transitioned to torsemide 40 mg twice daily. Discharged on 11/21/18 to home on oxygen.   She had RHC/LHC in 9/20, most  significant stenosis was 75% ostial D1.  There was mild to moderate nonobstructive disease in other vessels. There was not an interventional target.  Filling pressures were mildly elevated with preserved cardiac output.  Torsemide was increased.   Echo in 10/20 showed that EF remained 30-35% with wall motion abnormalities, RV appeared normal.   Medtronic ICD placed in 11/20.   She continues to use home oxygen and Bipap at night. Weight is up about 10 lbs.  She denies peripheral edema.  No chest pain.  She can walk around her house without dyspnea.  However, she is short of breath walking for longer distances.  No lightheadedness.   Labs (7/20): K 3.7, creatinine 1.56 Labs (9/20): K 3.7, creatinine 1.46, LDL 52, HDL 56  Labs (12/20): K 3.8, creatinine 1.51  PMH: 1. CVA (1/20). 2. Type 2 diabetes 3. HTN 4. H/o seizures 5. PEA arrest (5/20) with CPR.  6. COPD: Home oxygen.  Stopped smoking in 1/15.  7. CAD: PCI [facility] 2006, [facility] 2009.  - Inferior STEMI 5/15, DES to RCA.  - Poor responder to Plavix.  - LHC (9/20): 75% ostial D1, 40-50% mLCx, 40% proximal PLV.  8. Hyperlipidemia 9. PE: 2014, LLL.  10. OHS/OSA: Home oxygen and Bipap used.  11. Chronic systolic CHF: Suspect primarily ischemic cardiomyopathy. Medtronic ICD.  - Echo (5/15): EF 35-40% - Echo (2/16): EF 40-45% - Echo (1/20): EF 40-45% -  Echo (5/20): EF 30-35%, mild RV dilation.  - RHC (5/20): mean RA 9, PA 62/22 mean 37, mean PCWP 15, CI 4.29, PVR 2.1 WU.  - RHC (9/20): mean RA 11, PA 66/18 mean 37, mean PCWP 19, CI 3.12, PVR 2.4 WU - Echo (10/20): EF 30-35% with wall motion abnormalities, normal RV.  12. CKD: Stage 3.  13. Fe deficiency anemia: EGD and colonoscopy without definite source of bleeding.   ROS: All systems negative except as listed in HPI, PMH and Problem List.  SH:  Social History   Socioeconomic History  . Marital status: Single    Spouse name: Not on file  . Number of children: 1  . Years  of education: Not on file  . Highest education level: Not on file  Occupational History  . Not on file  Tobacco Use  . Smoking status: Former Smoker    Packs/day: 0.50    Years: 20.00    Pack years: 10.00    Types: Cigarettes    Quit date: 05/31/2018    Years since quitting: 1.0  . Smokeless tobacco: Never Used  . Tobacco comment: smoked off and on x 20 years  Substance and Sexual Activity  . Alcohol use: No    Alcohol/week: 0.0 standard drinks  . Drug use: Not Currently    Types: Cocaine  . Sexual activity: Not Currently  Other Topics Concern  . Not on file  Social History Narrative   Origibnally from [place]   Most recently from [place]   Daughter lives in town      On Social security to CHF, COPD, CAD   Social Determinants of Health   Financial Resource Strain:   . Difficulty of Paying Living Expenses: Not on file  Food Insecurity:   . Worried About Charity fundraiser in the Last Year: Not on file  . Ran Out of Food in the Last Year: Not on file  Transportation Needs:   . Lack of Transportation (Medical): Not on file  . Lack of Transportation (Non-Medical): Not on file  Physical Activity:   . Days of Exercise per Week: Not on file  . Minutes of Exercise per Session: Not on file  Stress:   . Feeling of Stress : Not on file  Social Connections:   . Frequency of Communication with Friends and Family: Not on file  . Frequency of Social Gatherings with Friends and Family: Not on file  . Attends Religious Services: Not on file  . Active Member of Clubs or Organizations: Not on file  . Attends Archivist Meetings: Not on file  . Marital Status: Not on file  Intimate Partner Violence: Unknown  . Fear of Current or Ex-Partner: Patient refused  . Emotionally Abused: Patient refused  . Physically Abused: Patient refused  . Sexually Abused: Patient refused    FH:  Family History  Problem Relation Age of Onset  . Stroke Mother   . Hypertension Mother     . Colon cancer Father   . Diabetes Father   . Heart attack Father   . Sarcoidosis Other   . Breast cancer Paternal Aunt   . Emphysema Brother   . Lung disease Brother        Unknown type, 3 brothers, one with liver and lung disease  . Diabetes Brother   . Asthma Paternal Aunt   . Diabetes Sister     Current Outpatient Medications  Medication Sig Dispense Refill  . ACCU-CHEK AVIVA  PLUS test strip USE 1 STRIP TO CHECK BLOOD GLUCOSE 3 TIMES A DAY 100 each 2  . acetaminophen (TYLENOL) 500 MG tablet Take 500 mg by mouth every 6 (six) hours as needed for moderate pain or headache.    . albuterol (PROAIR HFA) 108 (90 Base) MCG/ACT inhaler INHALE 2 PUFFS BY MOUTH 4 TIMES DAILY AS NEEDED (Patient taking differently: Inhale 2 puffs into the lungs every 4 (four) hours as needed. INHALE 2 PUFFS BY MOUTH 4 TIMES DAILY AS NEEDED) 18 g 11  . aspirin EC 81 MG tablet Take 1 tablet (81 mg total) by mouth daily. 90 tablet 3  . bisoprolol (ZEBETA) 5 MG tablet Take 1 tablet (5 mg total) by mouth daily. 30 tablet 6  . ferrous sulfate 325 (65 FE) MG tablet Take 325 mg by mouth daily with breakfast.    . gabapentin (NEURONTIN) 300 MG capsule Take 1 capsule (300 mg total) by mouth 3 (three) times daily. Start with 1 capsule at bedtime for 7 days then increase to 3 times a day if tolerated 90 capsule 3  . hydrocerin (EUCERIN) CREA Apply 1 application topically 2 (two) times daily.  0  . ipratropium-albuterol (DUONEB) 0.5-2.5 (3) MG/3ML SOLN Use nebulizer to inhale contents of one vial every 6 hours as needed for shortness of breath, wheezing (Patient taking differently: Inhale 3 mLs into the lungs every 6 (six) hours as needed (shortness of breath and wheezing). ) 360 mL 11  . isosorbide-hydrALAZINE (BIDIL) 20-37.5 MG tablet Take 1 tablet by mouth 3 (three) times daily. 90 tablet 6  . liraglutide (VICTOZA) 18 MG/3ML SOPN INJECT 1.8 MG INTO THE SKIN DAILY. 9 mL 2  . NEEDLE, DISP, 26 G 26G X 1/2" MISC 1 application  by Does not apply route daily. 100 each 0  . NOVOLOG FLEXPEN 100 UNIT/ML FlexPen Inject 14 units under the skin at breakfast and lunch and 18 at dinner. May also take additional 4-6 at bedtime for with snacks. 30 mL 0  . omeprazole (PRILOSEC) 40 MG capsule TAKE 1 CAPSULE (40 MG TOTAL) BY MOUTH DAILY. 30 capsule 2  . PAZEO 0.7 % SOLN Place 1 drop into both eyes daily.     . potassium chloride SA (KLOR-CON) 20 MEQ tablet TAKE 1 TABLET (20 MEQ TOTAL) BY MOUTH DAILY. 30 tablet 3  . RESTASIS 0.05 % ophthalmic emulsion Place 1 drop into both eyes 2 (two) times daily.     . rosuvastatin (CRESTOR) 40 MG tablet TAKE 1 TABLET (40 MG TOTAL) BY MOUTH DAILY AT 6 PM. 30 tablet 3  . sacubitril-valsartan (ENTRESTO) 24-26 MG Take 1 tablet by mouth 2 (two) times daily. 60 tablet 5  . spironolactone (ALDACTONE) 25 MG tablet TAKE 1 TABLET (25 MG TOTAL) BY MOUTH DAILY. 90 tablet 1  . torsemide (DEMADEX) 20 MG tablet Take 3 tablets (60 mg total) by mouth 2 (two) times daily. 180 tablet 3  . TRESIBA FLEXTOUCH 200 UNIT/ML SOPN INJECT 60 UNITS INTO THE SKIN DAILY. (Patient taking differently: Inject 60 Units into the skin daily. 60-64 units) 15 mL 1  . umeclidinium-vilanterol (ANORO ELLIPTA) 62.5-25 MCG/INH AEPB Inhale 1 puff into the lungs daily. 1 each 5  . umeclidinium-vilanterol (ANORO ELLIPTA) 62.5-25 MCG/INH AEPB Inhale 1 puff into the lungs daily. 21 each 0  . vitamin B-12 (CYANOCOBALAMIN) 1000 MCG tablet Take 1,000 mcg by mouth daily.     No current facility-administered medications for this encounter.    There were no vitals filed for  this visit. Wt Readings from Last 3 Encounters:  06/19/19 (!) 137.3 kg (302 lb 9.6 oz)  05/17/19 132.5 kg (292 lb 3.2 oz)  05/04/19 127.9 kg (282 lb)   Exam:  (Video/Tele Health Call; Exam is subjective and or/visual.) General:  Speaks in full sentences. No resp difficulty. Neck: JVP 10-11 cm Lungs: Normal respiratory effort with conversation.  Abdomen: Non-distended per  patient report Extremities: Pt denies edema. Neuro: Alert & oriented x 3.   ASSESSMENT & PLAN: 1. H/O PEA arrest: Due to severe hypoxemia in 5/20 in setting of CHF exacerbation.   2. Chronic systolic CHF: Suspect ischemic cardiomyopathy. Medtronic ICD.  Echo in 1/20 with EF 40-45%, RV moderately dilated, PASP 71 mmHg. Echo in 5/20 with lower EF, 30-35%, and mildly dilated RV. Suspect significant RV failure, may be related to OHS/OSA and COPD. She was massively volume overloaded during 5/20 admission and diuresed extensively.  RHC done 11/06/18 showed high cardiac output and minimally elevated filling pressures after diuresis.  PVR was not elevated, appeared to be high output PH.  RHC was done again in 9/20, filling pressures were elevated with preserved cardiac output.  Echo in 10/20 showed EF still 30-35%.  NYHA class III symptoms.  Weight up 10 lbs recently. She appears to have JVD.  - Increase torsemide to 80 qam/60 qpm x 2 days then continue torsemide 60 mg bid.   - Continue Bidil 1 tab tid.   - Continue spironolactone 25 mg daily.  - Creatinine has stabilized around 1.5.  I will start Entresto 24/26 bid (this will give some extra diuresis as well).  BMET in 10 days.  - Given significant lung disease/COPD, I have her on bisoprolol 5 mg daily (more beta-1 selective).  3. CKD: Stage III. Last creatinine 1.5.  - BMET 10 days as above.  4. COPD: On home oxygen at baseline. Has quit smoking.  5. OHS/OSA: On home oxygen at all times and Bipap at night.  6. CAD: History of PCI, most recently had inferior STEMI in 5/15 with DES to RCA. With fall in EF this year, coronary angiography was done in 9/20.  There was 75% ostial D1 stenosis but no good target for PCI.  - Continue ASA 81 and Crestor 40 daily.  Good lipids in 9/20.    7. Remote PE: She has not been anticoagulated.  8. Anemia: History of GI bleeding, had endoscopies earlier this year with no bleeding source found. Also with history of B12  deficiency.  9. Pulmonary hypertension: Pulmonary venous hypertension on last RHC in 9/20.    COVID screen The patient does not have any symptoms that suggest any further testing/ screening at this time.  Social distancing reinforced today.  Patient Risk: After full review of this patients clinical status, I feel that they are at moderate risk for cardiac decompensation at this time.  Relevant cardiac medications were reviewed at length with the patient today. The patient does not have concerns regarding their medications at this time.   Recommended follow-up:  1 month in office  Today, I have spent 21 minutes with the patient with telehealth technology discussing the above issues .    Signed, Loralie Champagne, MD  06/19/2019  [place] [address] [facility] and [place] [address] [phone] (office) [phone] (fax)

## 2019-06-19 NOTE — Progress Notes (Signed)
Spoke with patient, avs reviewed. Questions answered. Advised to take torsemide 80/60 x 2 days then back to normal dose. Verbalized understanding. avs mailed.

## 2019-06-20 ENCOUNTER — Other Ambulatory Visit: Payer: Self-pay | Admitting: Internal Medicine

## 2019-06-20 DIAGNOSIS — E1122 Type 2 diabetes mellitus with diabetic chronic kidney disease: Secondary | ICD-10-CM

## 2019-06-20 DIAGNOSIS — E1165 Type 2 diabetes mellitus with hyperglycemia: Secondary | ICD-10-CM

## 2019-06-20 DIAGNOSIS — IMO0002 Reserved for concepts with insufficient information to code with codable children: Secondary | ICD-10-CM

## 2019-06-21 ENCOUNTER — Other Ambulatory Visit: Payer: Self-pay | Admitting: Hematology and Oncology

## 2019-06-21 DIAGNOSIS — D890 Polyclonal hypergammaglobulinemia: Secondary | ICD-10-CM

## 2019-06-21 DIAGNOSIS — D500 Iron deficiency anemia secondary to blood loss (chronic): Secondary | ICD-10-CM

## 2019-06-21 NOTE — Progress Notes (Signed)
[facility] [phone]   [phone]  PROGRESS NOTE  Patient Care Team: Ladell Pier, MD as PCP - General (Internal Medicine) Larey Dresser, MD as PCP - Advanced Heart Failure (Cardiology) Constance Haw, MD as PCP - Electrophysiology (Cardiology) Sueanne Margarita, MD as PCP - Cardiology (Cardiology)  Hematological/Oncological History #Iron Deficiency Anemia/Vitamin B12 deficiency 1) 07/08/2018: admitted for symptomatic anemia, found to have Hgb 5.8  2/ 07/10/2018: WBC 7.9 HB 8.8 plts 347,000. EGD and colonoscopy showed diverticulosis and normal esophagus. No active bleeding identified.  3) 08/18/2018: WBC 6 HB 9.6 plts 220,000. MMA 583.  4) 03/19/2019: WBC 8.1, Hgb 11.0, Plt 260 5) 06/22/2019: Establish care with Dr. Lorenso Courier   #Polyclonal Gammopathy 1) 08/18/2018: SPEP showed no monoclonal protein.  FLC ratio stable at 2.23.  Interval History:  Tamara Silva 59 y.o. female with medical history significant for iron deficiency anemia, B12 deficiency, and polyclonal gammopathy presents for a follow up visit. The patient's last visit was on 09/06/2018 with Dr. Walden Field. In the interim since the last visit Tamara Silva was admitted inpatient for a cerebrovascular accident requiring intubation for seizure on 10/25/2018-11/07/2018.  On 04/04/2019 the patient underwent implantation of a [employer] single-chamber ICD.  Also in the interim she has had significant weight gain of approximately 30 pounds, which she attributes was exclusively to increased food intake.  On examination today the patient reports that she is at baseline.  She is having no increasing in shortness of breath or chest pain at this time.  She denies any overt sources of bleeding and reports no nosebleeds, bruising, or dark stools.  She also notes that she underwent menopause 5 years ago and has had no evidence of menstrual bleeding or spotting.  She notes that she is markedly an active due to  her oxygen requirement and increased weight.  She denies having any fevers, chills, sweats, nausea, vomiting or diarrhea.  She reports that she has no other questions or complaints today.  A 10 point ROS is listed below.  MEDICAL HISTORY:  Past Medical History:  Diagnosis Date  . AICD (automatic cardioverter/defibrillator) present 04/04/2019  . Asthma   . CAD (coronary artery disease)   . Chronic combined systolic (congestive) and diastolic (congestive) heart failure ([place])   . Chronic iron deficiency anemia   . CVA (cerebral vascular accident) ([place]) 05/2018  . Diabetes mellitus without complication ([place])   . DOE (dyspnea on exertion)   . Edema of both legs   . Hepatic steatosis   . Hypertension   . Pulmonary emboli ([place]) 2014  . Respiratory failure ([place]) 09/2018  . Seizure ([place]) 05/2018    SURGICAL HISTORY: Past Surgical History:  Procedure Laterality Date  . BIOPSY  07/10/2018   Procedure: BIOPSY;  Surgeon: [name], MD;  Location: [facility];  Service: Gastroenterology;;  . COLONOSCOPY WITH PROPOFOL N/A 08/03/2013   Procedure: COLONOSCOPY WITH PROPOFOL;  Surgeon: Jeryl Columbia, MD;  Location: [facility];  Service: Endoscopy;  Laterality: N/A;  . COLONOSCOPY WITH PROPOFOL N/A 07/10/2018   Procedure: COLONOSCOPY WITH PROPOFOL;  Surgeon: [name], MD;  Location: [facility];  Service: Gastroenterology;  Laterality: N/A;  . CORONARY ANGIOPLASTY WITH STENT PLACEMENT     CAD in 2006 x 2 and 2009 2 more- place din REx in [place] and [place] [facility]  . CORONARY ANGIOPLASTY WITH STENT PLACEMENT  10/07/2013   Xience [place] DES 2.75  mm x 15  mm  . ESOPHAGOGASTRODUODENOSCOPY (EGD) WITH PROPOFOL  N/A 08/03/2013   Procedure: ESOPHAGOGASTRODUODENOSCOPY (EGD) WITH PROPOFOL;  Surgeon: Jeryl Columbia, MD;  [facility]: [facility];  Service: Endoscopy;  Laterality: N/A;  . ESOPHAGOGASTRODUODENOSCOPY (EGD) WITH PROPOFOL N/A 07/10/2018   Procedure: ESOPHAGOGASTRODUODENOSCOPY (EGD) WITH PROPOFOL;  Surgeon:  [name], MD;  Location: [facility];  Service: Gastroenterology;  Laterality: N/A;  . ICD IMPLANT  04/04/2019  . ICD IMPLANT N/A 04/04/2019   Procedure: ICD IMPLANT;  Surgeon: Constance Haw, MD;  Location: [facility];  Service: Cardiovascular;  Laterality: N/A;  . LEFT HEART CATHETERIZATION WITH CORONARY ANGIOGRAM N/A 10/07/2013   Procedure: LEFT HEART CATHETERIZATION WITH CORONARY ANGIOGRAM;  Surgeon: Leonie Man, MD;  Location: [facility];  Service: Cardiovascular;  Laterality: N/A;  . RIGHT HEART CATH N/A 11/06/2018   Procedure: RIGHT HEART CATH;  Surgeon: Larey Dresser, MD;  Location: [facility];  Service: Cardiovascular;  Laterality: N/A;  . RIGHT/LEFT HEART CATH AND CORONARY ANGIOGRAPHY N/A 02/12/2019   Procedure: RIGHT/LEFT HEART CATH AND CORONARY ANGIOGRAPHY;  Surgeon: Larey Dresser, MD;  Location: [facility];  Service: Cardiovascular;  Laterality: N/A;    SOCIAL HISTORY: Social History   Socioeconomic History  . Marital status: Single    Spouse name: Not on file  . Number of children: 1  . Years of education: Not on file  . Highest education level: Not on file  Occupational History  . Not on file  Tobacco Use  . Smoking status: Former Smoker    Packs/day: 0.50    Years: 20.00    Pack years: 10.00    Types: Cigarettes    Quit date: 05/31/2018    Years since quitting: 1.0  . Smokeless tobacco: Never Used  . Tobacco comment: smoked off and on x 20 years  Substance and Sexual Activity  . Alcohol use: No    Alcohol/week: 0.0 standard drinks  . Drug use: Not Currently    Types: Cocaine  . Sexual activity: Not Currently  Other Topics Concern  . Not on file  Social History Narrative   Origibnally from [place]   Most recently from [place]   Daughter lives in town      On Social security to CHF, COPD, CAD   Social Determinants of Health   Financial Resource Strain:   . Difficulty of Paying Living Expenses: Not on file  Food  Insecurity:   . Worried About Charity fundraiser in the Last Year: Not on file  . Ran Out of Food in the Last Year: Not on file  Transportation Needs:   . Lack of Transportation (Medical): Not on file  . Lack of Transportation (Non-Medical): Not on file  Physical Activity:   . Days of Exercise per Week: Not on file  . Minutes of Exercise per Session: Not on file  Stress:   . Feeling of Stress : Not on file  Social Connections:   . Frequency of Communication with Friends and Family: Not on file  . Frequency of Social Gatherings with Friends and Family: Not on file  . Attends Religious Services: Not on file  . Active Member of Clubs or Organizations: Not on file  . Attends Archivist Meetings: Not on file  . Marital Status: Not on file  Intimate Partner Violence: Unknown  . Fear of Current or Ex-Partner: Patient refused  . Emotionally Abused: Patient refused  . Physically Abused: Patient refused  . Sexually Abused: Patient refused    FAMILY  HISTORY: Family History  Problem Relation Age of Onset  . Stroke Mother   . Hypertension Mother   . Colon cancer Father   . Diabetes Father   . Heart attack Father   . Sarcoidosis Other   . Breast cancer Paternal Aunt   . Emphysema Brother   . Lung disease Brother        Unknown type, 3 brothers, one with liver and lung disease  . Diabetes Brother   . Asthma Paternal Aunt   . Diabetes Sister     ALLERGIES:  is allergic to metolazone; plavix [clopidogrel bisulfate]; ibuprofen; and nsaids.  MEDICATIONS:  Current Outpatient Medications  Medication Sig Dispense Refill  . ACCU-CHEK AVIVA PLUS test strip USE 1 STRIP TO CHECK BLOOD GLUCOSE 3 TIMES A DAY 100 each 2  . acetaminophen (TYLENOL) 500 MG tablet Take 500 mg by mouth every 6 (six) hours as needed for moderate pain or headache.    . albuterol (PROAIR HFA) 108 (90 Base) MCG/ACT inhaler INHALE 2 PUFFS BY MOUTH 4 TIMES DAILY AS NEEDED (Patient taking differently: Inhale 2  puffs into the lungs every 4 (four) hours as needed. INHALE 2 PUFFS BY MOUTH 4 TIMES DAILY AS NEEDED) 18 g 11  . aspirin EC 81 MG tablet Take 1 tablet (81 mg total) by mouth daily. 90 tablet 3  . bisoprolol (ZEBETA) 5 MG tablet Take 1 tablet (5 mg total) by mouth daily. 30 tablet 6  . ferrous sulfate 325 (65 FE) MG tablet Take 1 tablet (325 mg total) by mouth daily after supper. Please take with a source of vitamin C 90 tablet 3  . gabapentin (NEURONTIN) 300 MG capsule Take 1 capsule (300 mg total) by mouth 3 (three) times daily. Start with 1 capsule at bedtime for 7 days then increase to 3 times a day if tolerated 90 capsule 3  . hydrocerin (EUCERIN) CREA Apply 1 application topically 2 (two) times daily.  0  . ipratropium-albuterol (DUONEB) 0.5-2.5 (3) MG/3ML SOLN Use nebulizer to inhale contents of one vial every 6 hours as needed for shortness of breath, wheezing (Patient taking differently: Inhale 3 mLs into the lungs every 6 (six) hours as needed (shortness of breath and wheezing). ) 360 mL 11  . isosorbide-hydrALAZINE (BIDIL) 20-37.5 MG tablet Take 1 tablet by mouth 3 (three) times daily. 90 tablet 6  . liraglutide (VICTOZA) 18 MG/3ML SOPN INJECT 1.8 MG INTO THE SKIN DAILY. 9 mL 2  . NEEDLE, DISP, 26 G 26G X 1/2" MISC 1 application by Does not apply route daily. 100 each 0  . NOVOLOG FLEXPEN 100 UNIT/ML FlexPen Inject 14 units under the skin at breakfast and lunch and 18 at dinner. May also take additional 4-6 at bedtime for with snacks. 30 mL 0  . omeprazole (PRILOSEC) 40 MG capsule TAKE 1 CAPSULE (40 MG TOTAL) BY MOUTH DAILY. 30 capsule 2  . PAZEO 0.7 % SOLN Place 1 drop into both eyes daily.     . potassium chloride SA (KLOR-CON) 20 MEQ tablet TAKE 1 TABLET (20 MEQ TOTAL) BY MOUTH DAILY. 30 tablet 3  . RESTASIS 0.05 % ophthalmic emulsion Place 1 drop into both eyes 2 (two) times daily.     . rosuvastatin (CRESTOR) 40 MG tablet TAKE 1 TABLET (40 MG TOTAL) BY MOUTH DAILY AT 6 PM. 30 tablet 3    . sacubitril-valsartan (ENTRESTO) 24-26 MG Take 1 tablet by mouth 2 (two) times daily. 60 tablet 5  . spironolactone (ALDACTONE) 25  MG tablet TAKE 1 TABLET (25 MG TOTAL) BY MOUTH DAILY. 90 tablet 1  . torsemide (DEMADEX) 20 MG tablet Take 3 tablets (60 mg total) by mouth 2 (two) times daily. 180 tablet 3  . TRESIBA FLEXTOUCH 200 UNIT/ML SOPN INJECT 60 UNITS INTO THE SKIN DAILY. (Patient taking differently: Inject 60 Units into the skin daily. 60-64 units) 15 mL 1  . umeclidinium-vilanterol (ANORO ELLIPTA) 62.5-25 MCG/INH AEPB Inhale 1 puff into the lungs daily. 21 each 0  . vitamin B-12 (CYANOCOBALAMIN) 1000 MCG tablet Take 1,000 mcg by mouth daily.    . vitamin C (ASCORBIC ACID) 250 MG tablet Take 2 tablets (500 mg total) by mouth daily. 90 tablet 3   No current facility-administered medications for this visit.    REVIEW OF SYSTEMS:   Constitutional: ( - ) fevers, ( - )  chills , ( - ) night sweats Eyes: ( - ) blurriness of vision, ( - ) double vision, ( - ) watery eyes Ears, nose, mouth, throat, and face: ( - ) mucositis, ( - ) sore throat Respiratory: ( - ) cough, ( + ) dyspnea, ( - ) wheezes Cardiovascular: ( - ) palpitation, ( - ) chest discomfort, ( - ) lower extremity swelling Gastrointestinal:  ( - ) nausea, ( - ) heartburn, ( - ) change in bowel habits Skin: ( - ) abnormal skin rashes Lymphatics: ( - ) new lymphadenopathy, ( - ) easy bruising Neurological: ( - ) numbness, ( - ) tingling, ( - ) new weaknesses Behavioral/Psych: ( - ) mood change, ( - ) new changes  All other systems were reviewed with the patient and are negative.  PHYSICAL EXAMINATION: ECOG PERFORMANCE STATUS: 3 - Symptomatic, >50% confined to bed  Vitals:   06/22/19 1035  BP: (!) 109/50  Pulse: 71  Resp: 17  Temp: 98 F (36.7 C)  SpO2: 100%   Filed Weights   06/22/19 1035  Weight: (!) 306 lb 4.8 oz (138.9 kg)    GENERAL: chronically ill appearing middle aged obese African American female in NAD   SKIN: skin color, texture, turgor are normal, no rashes or significant lesions EYES: conjunctiva are pink and non-injected, sclera clear LUNGS: clear to auscultation and percussion with normal breathing effort. Diminished breath sounds bilaterally HEART: regular rate & rhythm and no murmurs and no lower extremity edema Musculoskeletal: no cyanosis of digits and no clubbing  PSYCH: alert & oriented x 3, fluent speech NEURO: no focal motor/sensory deficits  LABORATORY DATA:  I have reviewed the data as listed CBC Latest Ref Rng & Units 06/22/2019 03/19/2019 02/12/2019  WBC 4.0 - 10.5 K/uL 8.2 8.1 -  Hemoglobin 12.0 - 15.0 g/dL 10.6(L) 11.0(L) 11.2(L)  Hematocrit 36.0 - 46.0 % 34.3(L) 35.7 33.0(L)  Platelets 150 - 400 K/uL 241 260 -    CMP Latest Ref Rng & Units 06/22/2019 05/08/2019 03/19/2019  Glucose 70 - 99 mg/dL 151(H) 73 141(H)  BUN 6 - 20 mg/dL 40(H) 29(H) 30(H)  Creatinine 0.44 - 1.00 mg/dL 1.91(H) 1.51(H) 1.48(H)  Sodium 135 - 145 mmol/L 140 140 142  Potassium 3.5 - 5.1 mmol/L 4.4 3.8 4.5  Chloride 98 - 111 mmol/L 98 100 99  CO2 22 - 32 mmol/L 31 35(H) 30(H)  Calcium 8.9 - 10.3 mg/dL 8.9 9.5 9.4  Total Protein 6.5 - 8.1 g/dL 7.8 8.1 -  Total Bilirubin 0.3 - 1.2 mg/dL <0.2(L) 0.2 -  Alkaline Phos 38 - 126 U/L 89 127(H) -  AST 15 -  41 U/L 15 19 -  ALT 0 - 44 U/L 14 17 -   Iron/TIBC/Ferritin/ %Sat    Component Value Date/Time   IRON 47 06/22/2019 0957   TIBC 293 06/22/2019 0957   FERRITIN 236 06/22/2019 0957   IRONPCTSAT 16 (L) 06/22/2019 0957     RADIOGRAPHIC STUDIES: None to review:  No results found.  ASSESSMENT & PLAN Tamara Silva 59 y.o. female with medical history significant for iron deficiency anemia, B12 deficiency, and polyclonal gammopathy presents for a follow up visit.  After review of the labs discussion with the patient her findings are most consistent with a chronic anemia, likely multifactorial.  I believe that there is some component of iron  deficiency contributing to her anemia, with her iron sat of 16%.  Her ferritin is falsely elevated in the setting of chronic inflammation.  Also given her worsening kidney function it is likely that stimulus to produce new red blood cells is markedly diminished.  Given this I do believe that her baseline hemoglobin may be now approximately 10-11 and will not likely improve with increased nutritional supplementation.  As such I do not believe that she is currently a candidate for IV iron therapy.  I do believe we can continue with p.o. iron therapy at this time and rechecking her vitamin B12 labs to assure that she has adequate supplementation.  Given these findings I think the best possible course forward to be to optimize her kidney function with improved glycemic and blood pressure control.  We will have her return to clinic in approximately 6 months time to reevaluate her iron level and determine if continued therapy is required.  Additionally her polyclonal gammopathy can be best explained once again by her worsening renal function.  There is no monoclonal component and therefore she does not meet the diagnosis of MGUS.  As such I think we can monitor this clinically for any aberrations in the free light chain ratio, though a ratio of 2.23 in the setting of CKD can be considered within normal limits ([place] Nephrol 21, 111 (2020).   #Iron Deficiency Anemia/Vitamin B12 deficiency --patient found to have Hgb 10.6 today. May represent a new baseline given her kidney function. Will repeat iron studies and vitamin b12 labs today --continue to take iron 316m PO daily with a source of vitamin C. Patient has elevated iron, but also has elevated inflammatory markers. Would use iron sat % as surrogate for total body iron levels --encourage good glycemic and blood pressure control for kidney function  --continue to monitor. F/u in 6 months time   #Polyclonal Gammopathy --noted to have a polyclonal gammopathy on  labs from 08/18/2018, no monoclonal component. -- Last K/L ratio found to be 2.23, appropriately elevated for level of kidney dysfunction.  --continue to monitor  No orders of the defined types were placed in this encounter.  All questions were answered. The patient knows to call the clinic with any problems, questions or concerns.  A total of more than 40 minutes were spent on this encounter and over half of that time was spent on counseling and coordination of care as outlined above.   JLedell Peoples MD Department of Hematology/Oncology CDickinsonat [facility] LLCPhone: [phone]: [phone]: [email or web address]  06/23/2019 11:41 AM   Literature Support:  MAlycia Rossetti A., RDelena Serve M.T. et al. The renal range of the / sFLC ratio: best strategy to evaluate multiple myeloma in patients with chronic kidney disease. [facility]  Nephrol 21, 111 (2020).  -- This study confirms the influence of eGFR on the interpretation of the sFLC ratio, showing a decreasing specificity in progressive CKD stages when using the reference sFLC range (Katzmann), especially in patients with eFGR ?55. According to our results, we suggest a modified optimal range (0.82-3,6) for eGFR ?55?ml/min/1.73?m2.

## 2019-06-22 ENCOUNTER — Other Ambulatory Visit: Payer: Self-pay

## 2019-06-22 ENCOUNTER — Telehealth: Payer: Self-pay

## 2019-06-22 ENCOUNTER — Inpatient Hospital Stay: Payer: Medicaid Other | Attending: Hematology and Oncology

## 2019-06-22 ENCOUNTER — Inpatient Hospital Stay (HOSPITAL_BASED_OUTPATIENT_CLINIC_OR_DEPARTMENT_OTHER): Payer: Medicaid Other | Admitting: Hematology and Oncology

## 2019-06-22 VITALS — BP 109/50 | HR 71 | Temp 98.0°F | Resp 17 | Ht 67.5 in | Wt 306.3 lb

## 2019-06-22 DIAGNOSIS — D509 Iron deficiency anemia, unspecified: Secondary | ICD-10-CM | POA: Insufficient documentation

## 2019-06-22 DIAGNOSIS — D500 Iron deficiency anemia secondary to blood loss (chronic): Secondary | ICD-10-CM | POA: Diagnosis not present

## 2019-06-22 DIAGNOSIS — Z79899 Other long term (current) drug therapy: Secondary | ICD-10-CM | POA: Insufficient documentation

## 2019-06-22 DIAGNOSIS — Z833 Family history of diabetes mellitus: Secondary | ICD-10-CM | POA: Insufficient documentation

## 2019-06-22 DIAGNOSIS — I10 Essential (primary) hypertension: Secondary | ICD-10-CM | POA: Insufficient documentation

## 2019-06-22 DIAGNOSIS — E538 Deficiency of other specified B group vitamins: Secondary | ICD-10-CM | POA: Diagnosis not present

## 2019-06-22 DIAGNOSIS — Z794 Long term (current) use of insulin: Secondary | ICD-10-CM | POA: Diagnosis not present

## 2019-06-22 DIAGNOSIS — Z7982 Long term (current) use of aspirin: Secondary | ICD-10-CM | POA: Insufficient documentation

## 2019-06-22 DIAGNOSIS — Z8673 Personal history of transient ischemic attack (TIA), and cerebral infarction without residual deficits: Secondary | ICD-10-CM | POA: Insufficient documentation

## 2019-06-22 DIAGNOSIS — Z87891 Personal history of nicotine dependence: Secondary | ICD-10-CM | POA: Diagnosis not present

## 2019-06-22 DIAGNOSIS — Z800 Family history of malignant neoplasm of digestive organs: Secondary | ICD-10-CM | POA: Insufficient documentation

## 2019-06-22 DIAGNOSIS — E119 Type 2 diabetes mellitus without complications: Secondary | ICD-10-CM | POA: Insufficient documentation

## 2019-06-22 DIAGNOSIS — Z803 Family history of malignant neoplasm of breast: Secondary | ICD-10-CM | POA: Insufficient documentation

## 2019-06-22 DIAGNOSIS — Z86711 Personal history of pulmonary embolism: Secondary | ICD-10-CM | POA: Diagnosis not present

## 2019-06-22 DIAGNOSIS — D890 Polyclonal hypergammaglobulinemia: Secondary | ICD-10-CM | POA: Diagnosis not present

## 2019-06-22 DIAGNOSIS — Z8249 Family history of ischemic heart disease and other diseases of the circulatory system: Secondary | ICD-10-CM | POA: Insufficient documentation

## 2019-06-22 LAB — CBC WITH DIFFERENTIAL (CANCER CENTER ONLY)
Abs Immature Granulocytes: 0.03 10*3/uL (ref 0.00–0.07)
Basophils Absolute: 0.1 10*3/uL (ref 0.0–0.1)
Basophils Relative: 1 %
Eosinophils Absolute: 0.2 10*3/uL (ref 0.0–0.5)
Eosinophils Relative: 3 %
HCT: 34.3 % — ABNORMAL LOW (ref 36.0–46.0)
Hemoglobin: 10.6 g/dL — ABNORMAL LOW (ref 12.0–15.0)
Immature Granulocytes: 0 %
Lymphocytes Relative: 31 %
Lymphs Abs: 2.5 10*3/uL (ref 0.7–4.0)
MCH: 27.3 pg (ref 26.0–34.0)
MCHC: 30.9 g/dL (ref 30.0–36.0)
MCV: 88.4 fL (ref 80.0–100.0)
Monocytes Absolute: 0.8 10*3/uL (ref 0.1–1.0)
Monocytes Relative: 10 %
Neutro Abs: 4.6 10*3/uL (ref 1.7–7.7)
Neutrophils Relative %: 55 %
Platelet Count: 241 10*3/uL (ref 150–400)
RBC: 3.88 MIL/uL (ref 3.87–5.11)
RDW: 14 % (ref 11.5–15.5)
WBC Count: 8.2 10*3/uL (ref 4.0–10.5)
nRBC: 0 % (ref 0.0–0.2)

## 2019-06-22 LAB — CMP (CANCER CENTER ONLY)
ALT: 14 U/L (ref 0–44)
AST: 15 U/L (ref 15–41)
Albumin: 3.4 g/dL — ABNORMAL LOW (ref 3.5–5.0)
Alkaline Phosphatase: 89 U/L (ref 38–126)
Anion gap: 11 (ref 5–15)
BUN: 40 mg/dL — ABNORMAL HIGH (ref 6–20)
CO2: 31 mmol/L (ref 22–32)
Calcium: 8.9 mg/dL (ref 8.9–10.3)
Chloride: 98 mmol/L (ref 98–111)
Creatinine: 1.91 mg/dL — ABNORMAL HIGH (ref 0.44–1.00)
GFR, Est AFR Am: 33 mL/min — ABNORMAL LOW (ref >60)
GFR, Estimated: 28 mL/min — ABNORMAL LOW (ref >60)
Glucose, Bld: 151 mg/dL — ABNORMAL HIGH (ref 70–99)
Potassium: 4.4 mmol/L (ref 3.5–5.1)
Sodium: 140 mmol/L (ref 135–145)
Total Bilirubin: <0.2 mg/dL — ABNORMAL LOW (ref 0.3–1.2)
Total Protein: 7.8 g/dL (ref 6.5–8.1)

## 2019-06-22 LAB — VITAMIN B12: Vitamin B-12: 1032 pg/mL — ABNORMAL HIGH (ref 180–914)

## 2019-06-22 LAB — IRON AND TIBC
Iron: 47 ug/dL (ref 41–142)
Saturation Ratios: 16 % — ABNORMAL LOW (ref 21–57)
TIBC: 293 ug/dL (ref 236–444)
UIBC: 246 ug/dL (ref 120–384)

## 2019-06-22 LAB — RETIC PANEL
Immature Retic Fract: 9.9 % (ref 2.3–15.9)
RBC.: 3.93 MIL/uL (ref 3.87–5.11)
Retic Count, Absolute: 62.5 10*3/uL (ref 19.0–186.0)
Retic Ct Pct: 1.6 % (ref 0.4–3.1)
Reticulocyte Hemoglobin: 30.2 pg (ref >27.9)

## 2019-06-22 LAB — FERRITIN: Ferritin: 236 ng/mL (ref 11–307)

## 2019-06-22 MED ORDER — VITAMIN C 250 MG PO TABS: 500.0000 mg | ORAL_TABLET | Freq: Every day | ORAL | 3 refills | Status: DC

## 2019-06-22 MED ORDER — FERROUS SULFATE 325 (65 FE) MG PO TABS: 325.0000 mg | ORAL_TABLET | Freq: Every day | ORAL | 3 refills | Status: DC

## 2019-06-22 NOTE — Telephone Encounter (Signed)
PA request was received from (pharmacy): [facility] [phone] Fax: [phone] Medication name and strength: Anoro Ordering Provider: Ander Slade  Was PA started with CMM?: no, NCTracks If yes, please enter KEY: n/a Medication tried and failed: Pulmocort, Symbicort, Dulera 100, Spiriva Covered Alternatives: n/a  PA approved from 06/22/2019 - 06/16/2020.  Pharmacy aware.  Nothing further needed at this time- will close encounter.

## 2019-06-23 ENCOUNTER — Encounter: Payer: Self-pay | Admitting: Hematology and Oncology

## 2019-06-25 ENCOUNTER — Telehealth: Payer: Self-pay | Admitting: Hematology and Oncology

## 2019-06-25 LAB — PROTEIN ELECTROPHORESIS, SERUM
A/G Ratio: 0.8 (ref 0.7–1.7)
Albumin ELP: 3.3 g/dL (ref 2.9–4.4)
Alpha-1-Globulin: 0.3 g/dL (ref 0.0–0.4)
Alpha-2-Globulin: 0.8 g/dL (ref 0.4–1.0)
Beta Globulin: 1.1 g/dL (ref 0.7–1.3)
Gamma Globulin: 2.1 g/dL — ABNORMAL HIGH (ref 0.4–1.8)
Globulin, Total: 4.2 g/dL — ABNORMAL HIGH (ref 2.2–3.9)
Total Protein ELP: 7.5 g/dL (ref 6.0–8.5)

## 2019-06-25 LAB — KAPPA/LAMBDA LIGHT CHAINS
Kappa free light chain: 125.1 mg/L — ABNORMAL HIGH (ref 3.3–19.4)
Kappa, lambda light chain ratio: 3.18 — ABNORMAL HIGH (ref 0.26–1.65)
Lambda free light chains: 39.3 mg/L — ABNORMAL HIGH (ref 5.7–26.3)

## 2019-06-25 NOTE — Telephone Encounter (Signed)
Scheduled per los. Called and spoke with patient. Confirmed appt and mailed printout per patients request.

## 2019-06-26 ENCOUNTER — Telehealth (HOSPITAL_COMMUNITY): Payer: Self-pay

## 2019-06-26 NOTE — Telephone Encounter (Signed)
Returned call at request of MD.  Lm for patient to call our office back.

## 2019-06-27 ENCOUNTER — Other Ambulatory Visit: Payer: Self-pay | Admitting: Endocrinology

## 2019-06-27 ENCOUNTER — Other Ambulatory Visit: Payer: Self-pay | Admitting: Internal Medicine

## 2019-06-27 ENCOUNTER — Other Ambulatory Visit: Payer: Self-pay | Admitting: Family Medicine

## 2019-06-27 DIAGNOSIS — IMO0002 Reserved for concepts with insufficient information to code with codable children: Secondary | ICD-10-CM

## 2019-06-27 DIAGNOSIS — I25119 Atherosclerotic heart disease of native coronary artery with unspecified angina pectoris: Secondary | ICD-10-CM

## 2019-06-27 DIAGNOSIS — E1142 Type 2 diabetes mellitus with diabetic polyneuropathy: Secondary | ICD-10-CM

## 2019-06-27 NOTE — Telephone Encounter (Signed)
Pt called this morning stating that she was told to increase fluid pills for two days by Dr Aundra Dubin for swollen ankles.  Pt did not endorse any other symptoms.  She will call office if no improvement.

## 2019-06-27 NOTE — Telephone Encounter (Signed)
Please advise on refill. Last lipid was 02/07/19 which was normal.

## 2019-06-29 LAB — METHYLMALONIC ACID, SERUM: Methylmalonic Acid, Quantitative: 469 nmol/L — ABNORMAL HIGH (ref 0–378)

## 2019-07-02 ENCOUNTER — Ambulatory Visit: Payer: Medicaid Other | Attending: Internal Medicine | Admitting: Internal Medicine

## 2019-07-02 ENCOUNTER — Other Ambulatory Visit: Payer: Self-pay

## 2019-07-03 ENCOUNTER — Other Ambulatory Visit (HOSPITAL_COMMUNITY): Payer: Medicaid Other

## 2019-07-10 ENCOUNTER — Encounter: Payer: Medicaid Other | Admitting: Cardiology

## 2019-07-11 ENCOUNTER — Telehealth: Payer: Self-pay

## 2019-07-11 NOTE — Telephone Encounter (Signed)
-----   Message from Otila Kluver, RN sent at 07/11/2019 10:46 AM EST -----  ----- Message ----- From: Orson Slick, MD Sent: 07/10/2019  11:37 AM EST To: Otila Kluver, RN  Please call Ms. Glenford Peers. Her labs are consistent with low vitamin b12, though it looks as though they are improving. Assure that she is taking her PO vitamin b12 10104mcg daily as prescribed (on her medicine list). We will see her back in about 3 months.   Colan Neptune  ----- Message ----- From: Buel Ream, Lab In [place] Sent: 06/22/2019  10:30 AM EST To: Orson Slick, MD

## 2019-07-11 NOTE — Telephone Encounter (Signed)
TCT patient about her labs being consistent with low vt B12. She verbalized that she is taking her PO vitamin B12 1046mcg daily in the morning. Patient was reminded that she will be seen again in 3 months and she verbalized understanding.

## 2019-07-12 ENCOUNTER — Telehealth (HOSPITAL_COMMUNITY): Payer: Self-pay

## 2019-07-12 NOTE — Telephone Encounter (Signed)
I called Ms Tamara Silva to schedule an appointment. She stated she was available tomorrow so we agreed to meet at 09:30.   Jacquiline Doe, EMT 07/12/19

## 2019-07-13 ENCOUNTER — Other Ambulatory Visit (HOSPITAL_COMMUNITY): Payer: Self-pay

## 2019-07-13 NOTE — Progress Notes (Signed)
Paramedicine Encounter    Patient ID: Tamara Silva, female    DOB: 1961/04/26, 59 y.o.   MRN: [id number]   Patient Care Team: Ladell Pier, MD as PCP - General (Internal Medicine) Larey Dresser, MD as PCP - Advanced Heart Failure (Cardiology) Constance Haw, MD as PCP - Electrophysiology (Cardiology) Sueanne Margarita, MD as PCP - Cardiology (Cardiology)  Patient Active Problem List   Diagnosis Date Noted  . Chronic systolic (congestive) heart failure ([place]) 04/04/2019  . Diabetic peripheral neuropathy ([place]) 01/19/2019  . Former smoker 01/19/2019  . CKD (chronic kidney disease) stage 3, GFR 30-59 ml/min 01/19/2019  . Cardiac arrest ([place]) 10/28/2018  . Seizure ([place]) 10/28/2018  . Noncompliance with treatment plan 07/24/2018  . General patient noncompliance 07/24/2018  . Noncompliance with diet and medication regimen 07/24/2018  . Dysarthria   . BiPAP (biphasic positive airway pressure) dependence   . Pancytopenia ([place]) 07/08/2018  . Type II diabetes mellitus with renal manifestations ([place]) 07/08/2018  . Hepatic steatosis 06/22/2018  . Chronic respiratory failure with hypoxia and hypercapnia (HCC)   . On home O2   . Slow transit constipation   . Cocaine abuse ([place])   . Diabetes mellitus type 2 in obese ([place])   . Generalized tonic-clonic seizure ([place]) 06/05/2018  . Cerebral embolism with cerebral infarction 06/01/2018  . Pressure injury of skin 05/30/2018  . Iron deficiency anemia 01/27/2018  . Tobacco abuse disorder 10/03/2017  . Dyslipidemia 04/29/2016  . Primary insomnia 04/29/2016  . Obesity hypoventilation syndrome ([place])   . OSA (obstructive sleep apnea)   . Dyslipidemia associated with type 2 diabetes mellitus ([place]) 05/21/2015  . COPD GOLD III  06/10/2014  . CAD S/P percutaneous coronary angioplasty - prior PCI to LAD; RCA PCI: new Xience [place] DES 2.75 mm x 15 mm  10/07/2013  . Morbid obesity ([place]) 07/30/2013  . Chronic combined systolic and diastolic  heart failure ([place]) 07/10/2013  . Chronic pulmonary embolism ([place]) 02/08/2013  . Bipolar disorder ([place]) 02/08/2013  . Essential hypertension 02/08/2013  . COPD (chronic obstructive pulmonary disease) ([place]) 10/15/2012  . Pulmonary embolism on right ([place]) 10/15/2012    Current Outpatient Medications:  .  acetaminophen (TYLENOL) 500 MG tablet, Take 500 mg by mouth every 6 (six) hours as needed for moderate pain or headache., Disp: , Rfl:  .  albuterol (PROAIR HFA) 108 (90 Base) MCG/ACT inhaler, INHALE 2 PUFFS BY MOUTH 4 TIMES DAILY AS NEEDED (Patient taking differently: Inhale 2 puffs into the lungs every 4 (four) hours as needed. INHALE 2 PUFFS BY MOUTH 4 TIMES DAILY AS NEEDED), Disp: 18 g, Rfl: 11 .  ACCU-CHEK AVIVA PLUS test strip, USE 1 STRIP TO CHECK BLOOD GLUCOSE 3 TIMES A DAY, Disp: 100 each, Rfl: 2 .  aspirin EC 81 MG tablet, Take 1 tablet (81 mg total) by mouth daily., Disp: 90 tablet, Rfl: 3 .  bisoprolol (ZEBETA) 5 MG tablet, Take 1 tablet (5 mg total) by mouth daily., Disp: 30 tablet, Rfl: 6 .  EASY COMFORT PEN NEEDLES 31G X 5 MM MISC, USE 3 TIMES A DAY FOR INSULIN ADMINISTRATION, Disp: 100 each, Rfl: 12 .  ferrous sulfate 325 (65 FE) MG tablet, Take 1 tablet (325 mg total) by mouth daily after supper. Please take with a source of vitamin C, Disp: 90 tablet, Rfl: 3 .  gabapentin (NEURONTIN) 300 MG capsule, Take 1 capsule (300 mg total) by mouth 3 (three) times daily. Start with 1 capsule at bedtime for 7  days then increase to 3 times a day if tolerated, Disp: 90 capsule, Rfl: 3 .  hydrocerin (EUCERIN) CREA, Apply 1 application topically 2 (two) times daily., Disp: , Rfl: 0 .  Insulin Degludec (TRESIBA FLEXTOUCH) 200 UNIT/ML SOPN, Inject 64 Units into the skin daily., Disp: 15 mL, Rfl: 2 .  ipratropium-albuterol (DUONEB) 0.5-2.5 (3) MG/3ML SOLN, Use nebulizer to inhale contents of one vial every 6 hours as needed for shortness of breath, wheezing (Patient taking differently: Inhale 3 mLs  into the lungs every 6 (six) hours as needed (shortness of breath and wheezing). ), Disp: 360 mL, Rfl: 11 .  isosorbide-hydrALAZINE (BIDIL) 20-37.5 MG tablet, Take 1 tablet by mouth 3 (three) times daily., Disp: 90 tablet, Rfl: 6 .  liraglutide (VICTOZA) 18 MG/3ML SOPN, INJECT 1.8 MG INTO THE SKIN DAILY., Disp: 9 mL, Rfl: 2 .  NEEDLE, DISP, 26 G 26G X 1/2" MISC, 1 application by Does not apply route daily., Disp: 100 each, Rfl: 0 .  NOVOLOG FLEXPEN 100 UNIT/ML FlexPen, Inject 18 units under the skin three times daily before meals. May also take 4-6 units before bedtime with snacks., Disp: 30 mL, Rfl: 2 .  omeprazole (PRILOSEC) 40 MG capsule, TAKE 1 CAPSULE (40 MG TOTAL) BY MOUTH DAILY., Disp: 30 capsule, Rfl: 2 .  PAZEO 0.7 % SOLN, Place 1 drop into both eyes daily. , Disp: , Rfl:  .  potassium chloride SA (KLOR-CON) 20 MEQ tablet, TAKE 1 TABLET (20 MEQ TOTAL) BY MOUTH DAILY., Disp: 30 tablet, Rfl: 3 .  RESTASIS 0.05 % ophthalmic emulsion, Place 1 drop into both eyes 2 (two) times daily. , Disp: , Rfl:  .  rosuvastatin (CRESTOR) 40 MG tablet, TAKE 1 TABLET (40 MG TOTAL) BY MOUTH DAILY AT 6 PM., Disp: 30 tablet, Rfl: 3 .  sacubitril-valsartan (ENTRESTO) 24-26 MG, Take 1 tablet by mouth 2 (two) times daily., Disp: 60 tablet, Rfl: 5 .  spironolactone (ALDACTONE) 25 MG tablet, TAKE 1 TABLET (25 MG TOTAL) BY MOUTH DAILY., Disp: 90 tablet, Rfl: 1 .  torsemide (DEMADEX) 20 MG tablet, Take 3 tablets (60 mg total) by mouth 2 (two) times daily., Disp: 180 tablet, Rfl: 3 .  umeclidinium-vilanterol (ANORO ELLIPTA) 62.5-25 MCG/INH AEPB, Inhale 1 puff into the lungs daily., Disp: 21 each, Rfl: 0 .  vitamin B-12 (CYANOCOBALAMIN) 1000 MCG tablet, Take 1,000 mcg by mouth daily., Disp: , Rfl:  .  vitamin C (ASCORBIC ACID) 250 MG tablet, Take 2 tablets (500 mg total) by mouth daily., Disp: 90 tablet, Rfl: 3 Allergies  Allergen Reactions  . Metolazone Other (See Comments)    Dizziness and falling  . Plavix  [Clopidogrel Bisulfate] Other (See Comments)    High PRU's - non-responder  . Ibuprofen Other (See Comments)    Pt states she is not supposed to take ibuprofen because of other meds she is taking  . Nsaids Other (See Comments)    "I do not take NSAIDs d/t interfering with other meds"      Social History   Socioeconomic History  . Marital status: Single    Spouse name: Not on file  . Number of children: 1  . Years of education: Not on file  . Highest education level: Not on file  Occupational History  . Not on file  Tobacco Use  . Smoking status: Former Smoker    Packs/day: 0.50    Years: 20.00    Pack years: 10.00    Types: Cigarettes    Quit date:  05/31/2018    Years since quitting: 1.1  . Smokeless tobacco: Never Used  . Tobacco comment: smoked off and on x 20 years  Substance and Sexual Activity  . Alcohol use: No    Alcohol/week: 0.0 standard drinks  . Drug use: Not Currently    Types: Cocaine  . Sexual activity: Not Currently  Other Topics Concern  . Not on file  Social History Narrative   Origibnally from [place]   Most recently from [place]   Daughter lives in town      On Social security to CHF, COPD, CAD   Social Determinants of Health   Financial Resource Strain:   . Difficulty of Paying Living Expenses: Not on file  Food Insecurity:   . Worried About Charity fundraiser in the Last Year: Not on file  . Ran Out of Food in the Last Year: Not on file  Transportation Needs:   . Lack of Transportation (Medical): Not on file  . Lack of Transportation (Non-Medical): Not on file  Physical Activity:   . Days of Exercise per Week: Not on file  . Minutes of Exercise per Session: Not on file  Stress:   . Feeling of Stress : Not on file  Social Connections:   . Frequency of Communication with Friends and Family: Not on file  . Frequency of Social Gatherings with Friends and Family: Not on file  . Attends Religious Services: Not on file  . Active Member of  Clubs or Organizations: Not on file  . Attends Archivist Meetings: Not on file  . Marital Status: Not on file  Intimate Partner Violence:   . Fear of Current or Ex-Partner: Not on file  . Emotionally Abused: Not on file  . Physically Abused: Not on file  . Sexually Abused: Not on file    Physical Exam Cardiovascular:     Rate and Rhythm: Normal rate and regular rhythm.     Pulses: Normal pulses.  Pulmonary:     Effort: Pulmonary effort is normal.     Breath sounds: Wheezing present.  Musculoskeletal:        General: Normal range of motion.     Right lower leg: No edema.     Left lower leg: No edema.  Skin:    General: Skin is warm and dry.     Capillary Refill: Capillary refill takes less than 2 seconds.  Neurological:     Mental Status: She is alert and oriented to person, place, and time.  Psychiatric:        Mood and Affect: Mood normal.         Future Appointments  Date Time Provider [place]  07/24/2019 11:20 AM Larey Dresser, MD [facility] None  12/20/2019  8:00 AM [facility] 2 [facility] None  12/20/2019  8:30 AM Orson Slick, MD [facility] None    BP 118/60 (BP Location: Left Arm, Patient Position: Sitting, Cuff Size: Normal)   Pulse 62   Resp 16   Wt (!) 311 lb 9.6 oz (141.3 kg)   LMP 07/28/2013 Comment: perimenopausal  SpO2 99%   BMI 48.08 kg/m   Weight yesterday- did not weigh Last visit weight- 306 lb  Tamara Silva was seen at home today and reported feeling well. She denied chest pain, headache, dizziness, orthopnea, or fever since our last visit however she did have a cough and intermittent SOB. She had some wheezing in all lung fields though she stated  she would take another breathing treatment after I left. She stated she has been compliant with her medications with the exception of her fluid medications when she was out of town for a funeral for obvious reasons. Her weight his up slightly as a result. Her medications  were verified and nothing else was needed at this time. I will follow up at her clinic appointment in two weeks and if nothing changes I will discuss discharge from paramedicine as she seems to handle things well on her own.   Jacquiline Doe, EMT 07/13/19  ACTION: Home visit completed Next visit planned for 1 week

## 2019-07-23 ENCOUNTER — Telehealth (HOSPITAL_COMMUNITY): Payer: Self-pay

## 2019-07-23 NOTE — Telephone Encounter (Signed)

## 2019-07-24 ENCOUNTER — Encounter (HOSPITAL_COMMUNITY): Payer: Self-pay | Admitting: Cardiology

## 2019-07-24 ENCOUNTER — Other Ambulatory Visit: Payer: Self-pay

## 2019-07-24 ENCOUNTER — Ambulatory Visit (HOSPITAL_COMMUNITY)
Admission: RE | Admit: 2019-07-24 | Discharge: 2019-07-24 | Disposition: A | Discharge status: Home / Self Care | Payer: Medicaid Other | Source: Ambulatory Visit | Attending: Cardiology | Admitting: Cardiology

## 2019-07-24 VITALS — BP 104/54 | HR 67 | Wt 315.4 lb

## 2019-07-24 DIAGNOSIS — N1832 Chronic kidney disease, stage 3b: Secondary | ICD-10-CM | POA: Diagnosis not present

## 2019-07-24 DIAGNOSIS — I5022 Chronic systolic (congestive) heart failure: Secondary | ICD-10-CM

## 2019-07-24 DIAGNOSIS — Z8674 Personal history of sudden cardiac arrest: Secondary | ICD-10-CM | POA: Insufficient documentation

## 2019-07-24 DIAGNOSIS — Z9981 Dependence on supplemental oxygen: Secondary | ICD-10-CM | POA: Insufficient documentation

## 2019-07-24 DIAGNOSIS — N183 Chronic kidney disease, stage 3 unspecified: Secondary | ICD-10-CM | POA: Insufficient documentation

## 2019-07-24 DIAGNOSIS — Z9861 Coronary angioplasty status: Secondary | ICD-10-CM

## 2019-07-24 DIAGNOSIS — G4733 Obstructive sleep apnea (adult) (pediatric): Secondary | ICD-10-CM | POA: Insufficient documentation

## 2019-07-24 DIAGNOSIS — Z95810 Presence of automatic (implantable) cardiac defibrillator: Secondary | ICD-10-CM | POA: Insufficient documentation

## 2019-07-24 DIAGNOSIS — E1122 Type 2 diabetes mellitus with diabetic chronic kidney disease: Secondary | ICD-10-CM | POA: Diagnosis not present

## 2019-07-24 DIAGNOSIS — E785 Hyperlipidemia, unspecified: Secondary | ICD-10-CM | POA: Diagnosis not present

## 2019-07-24 DIAGNOSIS — J449 Chronic obstructive pulmonary disease, unspecified: Secondary | ICD-10-CM | POA: Insufficient documentation

## 2019-07-24 DIAGNOSIS — I272 Pulmonary hypertension, unspecified: Secondary | ICD-10-CM | POA: Insufficient documentation

## 2019-07-24 DIAGNOSIS — Z7982 Long term (current) use of aspirin: Secondary | ICD-10-CM | POA: Insufficient documentation

## 2019-07-24 DIAGNOSIS — Z833 Family history of diabetes mellitus: Secondary | ICD-10-CM | POA: Insufficient documentation

## 2019-07-24 DIAGNOSIS — Z86711 Personal history of pulmonary embolism: Secondary | ICD-10-CM | POA: Diagnosis not present

## 2019-07-24 DIAGNOSIS — Z794 Long term (current) use of insulin: Secondary | ICD-10-CM | POA: Insufficient documentation

## 2019-07-24 DIAGNOSIS — I2510 Atherosclerotic heart disease of native coronary artery without angina pectoris: Secondary | ICD-10-CM

## 2019-07-24 DIAGNOSIS — Z8249 Family history of ischemic heart disease and other diseases of the circulatory system: Secondary | ICD-10-CM | POA: Diagnosis not present

## 2019-07-24 DIAGNOSIS — Z955 Presence of coronary angioplasty implant and graft: Secondary | ICD-10-CM | POA: Insufficient documentation

## 2019-07-24 DIAGNOSIS — Z79899 Other long term (current) drug therapy: Secondary | ICD-10-CM | POA: Diagnosis not present

## 2019-07-24 DIAGNOSIS — I252 Old myocardial infarction: Secondary | ICD-10-CM | POA: Diagnosis not present

## 2019-07-24 DIAGNOSIS — I130 Hypertensive heart and chronic kidney disease with heart failure and stage 1 through stage 4 chronic kidney disease, or unspecified chronic kidney disease: Secondary | ICD-10-CM | POA: Diagnosis not present

## 2019-07-24 DIAGNOSIS — Z87891 Personal history of nicotine dependence: Secondary | ICD-10-CM | POA: Diagnosis not present

## 2019-07-24 DIAGNOSIS — Z8673 Personal history of transient ischemic attack (TIA), and cerebral infarction without residual deficits: Secondary | ICD-10-CM | POA: Insufficient documentation

## 2019-07-24 LAB — BASIC METABOLIC PANEL
Anion gap: 11 (ref 5–15)
BUN: 46 mg/dL — ABNORMAL HIGH (ref 6–20)
CO2: 30 mmol/L (ref 22–32)
Calcium: 9 mg/dL (ref 8.9–10.3)
Chloride: 100 mmol/L (ref 98–111)
Creatinine, Ser: 1.81 mg/dL — ABNORMAL HIGH (ref 0.44–1.00)
GFR calc Af Amer: 35 mL/min — ABNORMAL LOW (ref >60)
GFR calc non Af Amer: 30 mL/min — ABNORMAL LOW (ref >60)
Glucose, Bld: 131 mg/dL — ABNORMAL HIGH (ref 70–99)
Potassium: 4.2 mmol/L (ref 3.5–5.1)
Sodium: 141 mmol/L (ref 135–145)

## 2019-07-24 MED ORDER — TORSEMIDE 20 MG PO TABS: 80.0000 mg | ORAL_TABLET | Freq: Two times a day (BID) | ORAL | 3 refills | Status: DC

## 2019-07-24 NOTE — Progress Notes (Signed)
ReDS Vest / Clip - 07/24/19 1200      ReDS Vest / Clip   Station Marker  D    Ruler Value  34    ReDS Value Range  (!) High volume overload    ReDS Actual Value  44

## 2019-07-24 NOTE — Patient Instructions (Signed)
Increase Torsemide to 80 mg (4 tabs) Twice daily   Labs done today, we will notify you of abnormal results  Labs in 10 days  Your physician recommends that you schedule a follow-up appointment in: 3 weeks  At the [facility], you and your health needs are our priority. As part of our continuing mission to provide you with exceptional heart care, we have created designated Provider Care Teams. These Care Teams include your primary Cardiologist (physician) and Advanced Practice Providers (APPs- Physician Assistants and Nurse Practitioners) who all work together to provide you with the care you need, when you need it.   You may see any of the following providers on your designated Care Team at your next follow up: [name] Kitchen Dr Glori Bickers . Dr Loralie Champagne . Darrick Grinder, NP . Lyda Jester, PA . Audry Riles, PharmD   Please be sure to bring in all your medications bottles to every appointment.

## 2019-07-25 NOTE — Progress Notes (Signed)
[name], DOB 1960/06/20, MRN [id number]   Provider location: [place] Advanced Heart Failure Type of Visit: Established patient   PCP:  Ladell Pier, MD  HF Cardiology: Dr Aundra Dubin   History of Present Illness: Tamara Silva is a 59 y.o. 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  She has had frequent admits for anemia and CHF with transfusions and diuresis. Prasugrel was stopped. No source of bleeding found on EGD and colonoscopy.   She is on home 02 and nocturnal BiPAP for OHS/OSA and COPD.   She no longer smokes.  Hx of PE in 2014.    Hx of bradycardia with Coreg at higher dose.  Echo 05/31/18 with EF 40-45%, PA peak pressure 71 mmHg.    Admitted 10/25/18 with SOB and edema, + productive cough.  This started over a few weeks prior to admission. Hospital course complicated by PEA arrest on 10/28/18. CPR for 25 minutes. Extubated 10/30/18.  Developed AKI.  HF team followed closely to optimize HF.  She was massively volume overloaded and extensively diuresed, weight down 65 lbs total.  Echo showed EF down to 30-35%.  No coronary angiography due to AKI.   Readmitted  11/17/18 with lower extremity edema and volume overload. Diuresed with IV lasix and transitioned to torsemide 40 mg twice daily. Discharged on 11/21/18 to home on oxygen.   She had RHC/LHC in 9/20, most significant stenosis was 75% ostial D1.  There was mild to moderate nonobstructive disease in other vessels. There was not an interventional target.  Filling pressures were mildly elevated with preserved cardiac output.  Torsemide was increased.   Echo in 10/20 showed that EF remained 30-35% with wall motion abnormalities, RV appeared normal.   Medtronic ICD placed in 11/20.   She returns today for followup of CHF.  She continues to use home oxygen and Bipap at night. Weight is up 13 lbs.  She is  not short of breath walking around the house but gets short of breath with longer distances. 3 pillow orthopnea.  No chest pain.  No lightheadedness.  No palpitations.    Medtronic device interrogation: fluid index > threshold, low thoracic impedance, no AF/VT.   REDS clip 44%  ECG (personally reviewed): NSR, inferior Qs  Labs (7/20): K 3.7, creatinine 1.56 Labs (9/20): K 3.7, creatinine 1.46, LDL 52, HDL 56  Labs (12/20): K 3.8, creatinine 1.51 Labs (1/21): hgb 10.6, K 4.4, creatinine 1.91  PMH: 1. CVA (1/20). 2. Type 2 diabetes 3. HTN 4. H/o seizures 5. PEA arrest (5/20) with CPR.  6. COPD: Home oxygen.  Stopped smoking in 1/15.  7. CAD: PCI [facility] 2006, [facility] 2009.  - Inferior STEMI 5/15, DES to RCA.  - Poor responder to Plavix.  - LHC (9/20): 75% ostial D1, 40-50% mLCx, 40% proximal PLV.  8. Hyperlipidemia 9. PE: 2014, L[facility].  10. OHS/OSA: Home oxygen and Bipap used.  11. Chronic systolic CHF: Suspect primarily ischemic cardiomyopathy. Medtronic ICD.  - Echo (5/15): EF 35-40% - Echo (2/16): EF 40-45% - Echo (1/20): EF 40-45% - Echo (5/20): EF 30-35%, mild RV dilation.  - RHC (5/20): mean RA 9, PA 62/22 mean 37, mean PCWP 15, CI 4.29, PVR 2.1 WU.  - RHC (9/20): mean RA 11, PA 66/18 mean 37, mean PCWP 19, CI  3.12, PVR 2.4 WU - Echo (10/20): EF 30-35% with wall motion abnormalities, normal RV.  12. CKD: Stage 3.  13. Fe deficiency anemia: EGD and colonoscopy without definite source of bleeding.   ROS: All systems negative except as listed in HPI, PMH and Problem List.  SH:  Social History   Socioeconomic History  . Marital status: Single    Spouse name: Not on file  . Number of children: 1  . Years of education: Not on file  . Highest education level: Not on file  Occupational History  . Not on file  Tobacco Use  . Smoking status: Former Smoker    Packs/day: 0.50    Years: 20.00    Pack years: 10.00    Types: Cigarettes    Quit date: 05/31/2018    Years  since quitting: 1.1  . Smokeless tobacco: Never Used  . Tobacco comment: smoked off and on x 20 years  Substance and Sexual Activity  . Alcohol use: No    Alcohol/week: 0.0 standard drinks  . Drug use: Not Currently    Types: Cocaine  . Sexual activity: Not Currently  Other Topics Concern  . Not on file  Social History Narrative   Origibnally from [place]   Most recently from [place]   Daughter lives in town      On Social security to CHF, COPD, CAD   Social Determinants of Health   Financial Resource Strain:   . Difficulty of Paying Living Expenses: Not on file  Food Insecurity:   . Worried About Charity fundraiser in the Last Year: Not on file  . Ran Out of Food in the Last Year: Not on file  Transportation Needs:   . Lack of Transportation (Medical): Not on file  . Lack of Transportation (Non-Medical): Not on file  Physical Activity:   . Days of Exercise per Week: Not on file  . Minutes of Exercise per Session: Not on file  Stress:   . Feeling of Stress : Not on file  Social Connections:   . Frequency of Communication with Friends and Family: Not on file  . Frequency of Social Gatherings with Friends and Family: Not on file  . Attends Religious Services: Not on file  . Active Member of Clubs or Organizations: Not on file  . Attends Archivist Meetings: Not on file  . Marital Status: Not on file  Intimate Partner Violence:   . Fear of Current or Ex-Partner: Not on file  . Emotionally Abused: Not on file  . Physically Abused: Not on file  . Sexually Abused: Not on file    FH:  Family History  Problem Relation Age of Onset  . Stroke Mother   . Hypertension Mother   . Colon cancer Father   . Diabetes Father   . Heart attack Father   . Sarcoidosis Other   . Breast cancer Paternal Aunt   . Emphysema Brother   . Lung disease Brother        Unknown type, 3 brothers, one with liver and lung disease  . Diabetes Brother   . Asthma Paternal Aunt   .  Diabetes Sister     Current Outpatient Medications  Medication Sig Dispense Refill  . ACCU-CHEK AVIVA PLUS test strip USE 1 STRIP TO CHECK BLOOD GLUCOSE 3 TIMES A DAY 100 each 2  . acetaminophen (TYLENOL) 500 MG tablet Take 500 mg by mouth every 6 (six) hours as needed for moderate pain  or headache.    . albuterol (PROAIR HFA) 108 (90 Base) MCG/ACT inhaler INHALE 2 PUFFS BY MOUTH 4 TIMES DAILY AS NEEDED 18 g 11  . aspirin EC 81 MG tablet Take 1 tablet (81 mg total) by mouth daily. 90 tablet 3  . bisoprolol (ZEBETA) 5 MG tablet Take 1 tablet (5 mg total) by mouth daily. 30 tablet 6  . EASY COMFORT PEN NEEDLES 31G X 5 MM MISC USE 3 TIMES A DAY FOR INSULIN ADMINISTRATION 100 each 12  . ferrous sulfate 325 (65 FE) MG tablet Take 1 tablet (325 mg total) by mouth daily after supper. Please take with a source of vitamin C 90 tablet 3  . gabapentin (NEURONTIN) 300 MG capsule Take 1 capsule (300 mg total) by mouth 3 (three) times daily. Start with 1 capsule at bedtime for 7 days then increase to 3 times a day if tolerated 90 capsule 3  . hydrocerin (EUCERIN) CREA Apply 1 application topically 2 (two) times daily.  0  . Insulin Degludec (TRESIBA FLEXTOUCH) 200 UNIT/ML SOPN Inject 64 Units into the skin daily. 15 mL 2  . ipratropium-albuterol (DUONEB) 0.5-2.5 (3) MG/3ML SOLN Use nebulizer to inhale contents of one vial every 6 hours as needed for shortness of breath, wheezing (Patient taking differently: Inhale 3 mLs into the lungs every 6 (six) hours as needed (shortness of breath and wheezing). ) 360 mL 11  . isosorbide-hydrALAZINE (BIDIL) 20-37.5 MG tablet Take 1 tablet by mouth 3 (three) times daily. 90 tablet 6  . liraglutide (VICTOZA) 18 MG/3ML SOPN INJECT 1.8 MG INTO THE SKIN DAILY. 9 mL 2  . NEEDLE, DISP, 26 G 26G X 1/2" MISC 1 application by Does not apply route daily. 100 each 0  . NOVOLOG FLEXPEN 100 UNIT/ML FlexPen Inject 18 units under the skin three times daily before meals. May also take 4-6  units before bedtime with snacks. 30 mL 2  . omeprazole (PRILOSEC) 40 MG capsule TAKE 1 CAPSULE (40 MG TOTAL) BY MOUTH DAILY. 30 capsule 2  . PAZEO 0.7 % SOLN Place 1 drop into both eyes daily.     . potassium chloride SA (KLOR-CON) 20 MEQ tablet TAKE 1 TABLET (20 MEQ TOTAL) BY MOUTH DAILY. 30 tablet 3  . RESTASIS 0.05 % ophthalmic emulsion Place 1 drop into both eyes daily as needed.     . rosuvastatin (CRESTOR) 40 MG tablet TAKE 1 TABLET (40 MG TOTAL) BY MOUTH DAILY AT 6 PM. 30 tablet 3  . sacubitril-valsartan (ENTRESTO) 24-26 MG Take 1 tablet by mouth 2 (two) times daily. 60 tablet 5  . spironolactone (ALDACTONE) 25 MG tablet TAKE 1 TABLET (25 MG TOTAL) BY MOUTH DAILY. 90 tablet 1  . torsemide (DEMADEX) 20 MG tablet Take 4 tablets (80 mg total) by mouth 2 (two) times daily. 240 tablet 3  . umeclidinium-vilanterol (ANORO ELLIPTA) 62.5-25 MCG/INH AEPB Inhale 1 puff into the lungs daily. 21 each 0  . vitamin B-12 (CYANOCOBALAMIN) 1000 MCG tablet Take 1,000 mcg by mouth daily.    . vitamin C (ASCORBIC ACID) 250 MG tablet Take 2 tablets (500 mg total) by mouth daily. 90 tablet 3   No current facility-administered medications for this encounter.    Vitals:   07/24/19 1140  BP: (!) 104/54  Pulse: 67  SpO2: 100%  Weight: (!) 143.1 kg (315 lb 6.4 oz)   Wt Readings from Last 3 Encounters:  07/24/19 (!) 143.1 kg (315 lb 6.4 oz)  07/13/19 (!) 141.3 kg (311  lb 9.6 oz)  06/22/19 (!) 138.9 kg (306 lb 4.8 oz)   Exam:   General: NAD Neck: Thick, JVP 8-9 cm, no thyromegaly or thyroid nodule.  Lungs: Occasional rhonchi CV: Nondisplaced PMI.  Heart regular S1/S2, no S3/S4, no murmur.  1+ edema 1/2 up lower legs bilaterally.  No carotid bruit.  Normal pedal pulses.  Abdomen: Soft, nontender, no hepatosplenomegaly, no distention.  Skin: Intact without lesions or rashes.  Neurologic: Alert and oriented x 3.  Psych: Normal affect. Extremities: No clubbing or cyanosis.  HEENT: Normal.      ASSESSMENT & PLAN: 1. H/O PEA arrest: Due to severe hypoxemia in 5/20 in setting of CHF exacerbation.   2. Chronic systolic CHF: Suspect ischemic cardiomyopathy. Medtronic ICD.  Echo in 1/20 with EF 40-45%, RV moderately dilated, PASP 71 mmHg. Echo in 5/20 with lower EF, 30-35%, and mildly dilated RV. Suspect significant RV failure, may be related to OHS/OSA and COPD. She was massively volume overloaded during 5/20 admission and diuresed extensively.  RHC done 11/06/18 showed high cardiac output and minimally elevated filling pressures after diuresis.  PVR was not elevated, appeared to be high output PH.  RHC was done again in 9/20, filling pressures were elevated with preserved cardiac output.  Echo in 10/20 showed EF still 30-35%.  NYHA class III symptoms.  Weight is up.  She is volume overloaded by exam, Optivol, and REDS clip.  This is complicated by cardiorenal syndrome and creatinine up to 1.9 when last checked.   - Increase torsemide to 80 mg bid.  BMET today and again in 10 days.   - Continue Bidil 1 tab tid.   - Continue spironolactone 25 mg daily.  - Continue Entresto 24/26 bid.  - Given significant lung disease/COPD, I have her on bisoprolol 5 mg daily (more beta-1 selective).  3. CKD: Stage III. Last creatinine 1.9.   - BMET today.  4. COPD: On home oxygen at baseline. Has quit smoking.  5. OHS/OSA: On home oxygen at all times and Bipap at night.  6. CAD: History of PCI, most recently had inferior STEMI in 5/15 with DES to RCA. With fall in EF this year, coronary angiography was done in 9/20.  There was 75% ostial D1 stenosis but no good target for PCI. No chest pain.  - Continue ASA 81 and Crestor 40 daily.  Good lipids in 9/20.    7. Remote PE: She has not been anticoagulated.  8. Anemia: History of GI bleeding, had endoscopies earlier this year with no bleeding source found. Also with history of B12 deficiency.  9. Pulmonary hypertension: Pulmonary venous hypertension on last  RHC in 9/20.   Recommended follow-up:  3 wks with NP/PA.    Signed, Loralie Champagne, MD  07/25/2019  [place] [address] [facility] [place] [address] [phone] (office) [phone] (fax)

## 2019-07-26 ENCOUNTER — Other Ambulatory Visit (HOSPITAL_COMMUNITY): Payer: Self-pay | Admitting: Adult Health

## 2019-07-26 ENCOUNTER — Telehealth (HOSPITAL_COMMUNITY): Payer: Self-pay | Admitting: *Deleted

## 2019-07-26 NOTE — Telephone Encounter (Signed)
Pt aware.

## 2019-07-26 NOTE — Telephone Encounter (Signed)
Faxed clearance to [facility]

## 2019-07-26 NOTE — Telephone Encounter (Signed)
Nothing to hold.

## 2019-07-26 NOTE — Telephone Encounter (Signed)
Tamara Silva w/paramedicine called to report is scheduled for a colonoscopy with [facility] and they are requesting surgical clearance also asking if any medications need to be held.  Routed to [place] for advice

## 2019-07-31 ENCOUNTER — Telehealth (HOSPITAL_COMMUNITY): Payer: Self-pay | Admitting: Pharmacist

## 2019-07-31 NOTE — Telephone Encounter (Signed)
Advanced Heart Failure Patient Advocate Encounter  Prior Authorization for bisoprolol has been approved.    PA# [id number] Effective dates: 07/31/2019 - 07/30/2020  Patients co-pay is $3.00  Audry Riles, PharmD, BCPS, BCCP, CPP Heart Failure Clinic Pharmacist [phone]

## 2019-08-01 ENCOUNTER — Other Ambulatory Visit (HOSPITAL_COMMUNITY): Payer: Self-pay

## 2019-08-01 MED ORDER — BISOPROLOL FUMARATE 5 MG PO TABS: 5.0000 mg | ORAL_TABLET | Freq: Every day | ORAL | 6 refills | Status: DC

## 2019-08-02 ENCOUNTER — Ambulatory Visit: Payer: Medicaid Other | Admitting: Internal Medicine

## 2019-08-02 ENCOUNTER — Telehealth: Payer: Self-pay | Admitting: Adult Health

## 2019-08-02 NOTE — Telephone Encounter (Signed)
Ignore message - was training [place] - IGNORE!!

## 2019-08-02 NOTE — Telephone Encounter (Signed)
Left message for patient to call back  

## 2019-08-03 ENCOUNTER — Other Ambulatory Visit: Payer: Self-pay

## 2019-08-03 ENCOUNTER — Telehealth (HOSPITAL_COMMUNITY): Payer: Self-pay

## 2019-08-03 ENCOUNTER — Ambulatory Visit (INDEPENDENT_AMBULATORY_CARE_PROVIDER_SITE_OTHER): Payer: Medicaid Other | Admitting: Adult Health

## 2019-08-03 ENCOUNTER — Encounter: Payer: Self-pay | Admitting: Adult Health

## 2019-08-03 ENCOUNTER — Ambulatory Visit (HOSPITAL_COMMUNITY)
Admission: RE | Admit: 2019-08-03 | Discharge: 2019-08-03 | Disposition: A | Discharge status: Home / Self Care | Payer: Medicaid Other | Source: Ambulatory Visit | Attending: Internal Medicine | Admitting: Internal Medicine

## 2019-08-03 DIAGNOSIS — I5022 Chronic systolic (congestive) heart failure: Secondary | ICD-10-CM | POA: Diagnosis present

## 2019-08-03 DIAGNOSIS — J449 Chronic obstructive pulmonary disease, unspecified: Secondary | ICD-10-CM | POA: Diagnosis not present

## 2019-08-03 DIAGNOSIS — E662 Morbid (severe) obesity with alveolar hypoventilation: Secondary | ICD-10-CM

## 2019-08-03 DIAGNOSIS — I5042 Chronic combined systolic (congestive) and diastolic (congestive) heart failure: Secondary | ICD-10-CM | POA: Diagnosis not present

## 2019-08-03 LAB — BASIC METABOLIC PANEL
Anion gap: 10 (ref 5–15)
BUN: 44 mg/dL — ABNORMAL HIGH (ref 6–20)
CO2: 32 mmol/L (ref 22–32)
Calcium: 9.2 mg/dL (ref 8.9–10.3)
Chloride: 102 mmol/L (ref 98–111)
Creatinine, Ser: 1.72 mg/dL — ABNORMAL HIGH (ref 0.44–1.00)
GFR calc Af Amer: 37 mL/min — ABNORMAL LOW (ref >60)
GFR calc non Af Amer: 32 mL/min — ABNORMAL LOW (ref >60)
Glucose, Bld: 86 mg/dL (ref 70–99)
Potassium: 4.2 mmol/L (ref 3.5–5.1)
Sodium: 144 mmol/L (ref 135–145)

## 2019-08-03 MED ORDER — IPRATROPIUM-ALBUTEROL 0.5-2.5 (3) MG/3ML IN SOLN: RESPIRATORY_TRACT | 11 refills | Status: DC

## 2019-08-03 MED ORDER — AZITHROMYCIN 250 MG PO TABS: ORAL_TABLET | ORAL | 0 refills | Status: AC

## 2019-08-03 MED ORDER — ALBUTEROL SULFATE HFA 108 (90 BASE) MCG/ACT IN AERS: INHALATION_SPRAY | RESPIRATORY_TRACT | 11 refills | Status: DC

## 2019-08-03 NOTE — Telephone Encounter (Signed)
Pt called back-- please return call [phone] or [phone]

## 2019-08-03 NOTE — Telephone Encounter (Signed)
Called and spoke with pt who states she has had ratting in chest and was told by cards that she might have some fluid in lungs. Pt stated cards increased her lasix x10 days. Cards also has done labwork today.  Pt stated the rattling began 1 week ago. Pt stated she has had to do about 5 breathing tx throughout the day and states usually she only does 4 but had to do 1 extra.  Due to not getting help with this rattling she feels like she might need an rx.  Stated to pt that we should get her scheduled for a televisit with TP to further discuss and she verbalized understanding. Pt has been scheduled for a televisit with TP today at 11:30. Nothing further needed.

## 2019-08-03 NOTE — Patient Instructions (Addendum)
Zpack take as directed.  Chest xray today .  Continue on ANORO 1 puff daily , rinse after use.  Duoneb every 6hr as needed for wheezing .  Continue on Oxygen 2l/m .  Continue on  BIPAP  At bedtime    Low salt diet .   Work on [employer]  Do not drive if sleepy.  Continue follow up with Cardiology as planned  Follow up with Dr. Hermina Staggers or Chelcey Caputo NP  in 4-6 weeks  and As needed   Please contact office for sooner follow up if symptoms do not improve or worsen or seek emergency care

## 2019-08-03 NOTE — Telephone Encounter (Signed)
I called Ms Hettich to see if she would be available to meet after her lab appointment today. She did not answer and there was not answering machine for me to leave a message. I will try again later.   Jacquiline Doe, EMT 08/03/19

## 2019-08-03 NOTE — Telephone Encounter (Signed)
I called Ms Spilker to see if she was available for a visit. She did not answer and there was no answering machine or voicemail on which I could leave a message. I will reach out again next week.   Jacquiline Doe, EMT 08/03/19

## 2019-08-03 NOTE — Progress Notes (Signed)
Virtual Visit via Telephone Note  I connected with Tamara Silva on 08/03/19 at 11:30 AM EST by telephone and verified that I am speaking with the correct person using two identifiers.  Location: Patient: Research scientist (life sciences) medical office  Provider: Office    I discussed the limitations, risks, security and privacy concerns of performing an evaluation and management service by telephone and the availability of in person appointments. I also discussed with the patient that there may be a patient responsible charge related to this service. The patient expressed understanding and agreed to proceed.   History of Present Illness: 59 yo female former smoker followed for COPD , OSA /OHS , Chronic Respiratory Failure on Oxygen  Medical History significant for CAD , CHF , PEA arrest 2020 , ischemic CVA c/by seizures , OHS on BIPAP   Today's televisit is for an acute office visit . Complains of increased chest congestion for last 2 weeks with dyspnea . She is following with cardiology recently with increased weight gain, fluid retention . Diuretics were adjusted. Says weight is going down but she still has congestion that will not come up and feels thick . Cough is non productive.  No fever. Has good appetite .  Remains on Oxygen 2l/m . Wear BIPAP At bedtime  . Says she is being compliant.   No chest pain , orthopnea or hemoptysis.    Patient Active Problem List   Diagnosis Date Noted  . Chronic systolic (congestive) heart failure ([place]) 04/04/2019  . Diabetic peripheral neuropathy ([place]) 01/19/2019  . Former smoker 01/19/2019  . CKD (chronic kidney disease) stage 3, GFR 30-59 ml/min 01/19/2019  . Cardiac arrest ([place]) 10/28/2018  . Seizure ([place]) 10/28/2018  . Noncompliance with treatment plan 07/24/2018  . General patient noncompliance 07/24/2018  . Noncompliance with diet and medication regimen 07/24/2018  . Dysarthria   . BiPAP (biphasic positive airway pressure) dependence   . Pancytopenia ([place])  07/08/2018  . Type II diabetes mellitus with renal manifestations ([place]) 07/08/2018  . Hepatic steatosis 06/22/2018  . Chronic respiratory failure with hypoxia and hypercapnia (H[facility])   . On home O2   . Slow transit constipation   . Cocaine abuse ([place])   . Diabetes mellitus type 2 in obese ([place])   . Generalized tonic-clonic seizure ([place]) 06/05/2018  . Cerebral embolism with cerebral infarction 06/01/2018  . Pressure injury of skin 05/30/2018  . Iron deficiency anemia 01/27/2018  . Tobacco abuse disorder 10/03/2017  . Dyslipidemia 04/29/2016  . Primary insomnia 04/29/2016  . Obesity hypoventilation syndrome ([place])   . OSA (obstructive sleep apnea)   . Dyslipidemia associated with type 2 diabetes mellitus ([place]) 05/21/2015  . COPD GOLD III  06/10/2014  . CAD S/P percutaneous coronary angioplasty - prior PCI to LAD; RCA PCI: new Xience [place] DES 2.75 mm x 15 mm  10/07/2013  . Morbid obesity ([place]) 07/30/2013  . Chronic combined systolic and diastolic heart failure ([place]) 07/10/2013  . Chronic pulmonary embolism ([place]) 02/08/2013  . Bipolar disorder ([place]) 02/08/2013  . Essential hypertension 02/08/2013  . COPD (chronic obstructive pulmonary disease) ([place]) 10/15/2012  . Pulmonary embolism on right ([place]) 10/15/2012   Current Outpatient Medications on File Prior to Visit  Medication Sig Dispense Refill  . ACCU-CHEK AVIVA PLUS test strip USE 1 STRIP TO CHECK BLOOD GLUCOSE 3 TIMES A DAY 100 each 2  . acetaminophen (TYLENOL) 500 MG tablet Take 500 mg by mouth every 6 (six) hours as needed for moderate pain or headache.    [name] Kitchen  albuterol (PROAIR HFA) 108 (90 Base) MCG/ACT inhaler INHALE 2 PUFFS BY MOUTH 4 TIMES DAILY AS NEEDED 18 g 11  . aspirin EC 81 MG tablet Take 1 tablet (81 mg total) by mouth daily. 90 tablet 3  . bisoprolol (ZEBETA) 5 MG tablet Take 1 tablet (5 mg total) by mouth daily. 30 tablet 6  . EASY COMFORT PEN NEEDLES 31G X 5 MM MISC USE 3 TIMES A DAY FOR INSULIN ADMINISTRATION 100 each  12  . ferrous sulfate 325 (65 FE) MG tablet Take 1 tablet (325 mg total) by mouth daily after supper. Please take with a source of vitamin C 90 tablet 3  . gabapentin (NEURONTIN) 300 MG capsule Take 1 capsule (300 mg total) by mouth 3 (three) times daily. Start with 1 capsule at bedtime for 7 days then increase to 3 times a day if tolerated 90 capsule 3  . hydrocerin (EUCERIN) CREA Apply 1 application topically 2 (two) times daily.  0  . Insulin Degludec (TRESIBA FLEXTOUCH) 200 UNIT/ML SOPN Inject 64 Units into the skin daily. 15 mL 2  . ipratropium-albuterol (DUONEB) 0.5-2.5 (3) MG/3ML SOLN Use nebulizer to inhale contents of one vial every 6 hours as needed for shortness of breath, wheezing (Patient taking differently: Inhale 3 mLs into the lungs every 6 (six) hours as needed (shortness of breath and wheezing). ) 360 mL 11  . isosorbide-hydrALAZINE (BIDIL) 20-37.5 MG tablet Take 1 tablet by mouth 3 (three) times daily. 90 tablet 6  . liraglutide (VICTOZA) 18 MG/3ML SOPN INJECT 1.8 MG INTO THE SKIN DAILY. 9 mL 2  . NEEDLE, DISP, 26 G 26G X 1/2" MISC 1 application by Does not apply route daily. 100 each 0  . NOVOLOG FLEXPEN 100 UNIT/ML FlexPen Inject 18 units under the skin three times daily before meals. May also take 4-6 units before bedtime with snacks. 30 mL 2  . omeprazole (PRILOSEC) 40 MG capsule TAKE 1 CAPSULE (40 MG TOTAL) BY MOUTH DAILY. 30 capsule 2  . PAZEO 0.7 % SOLN Place 1 drop into both eyes daily.     . potassium chloride SA (KLOR-CON) 20 MEQ tablet TAKE 1 TABLET (20 MEQ TOTAL) BY MOUTH DAILY. 30 tablet 3  . RESTASIS 0.05 % ophthalmic emulsion Place 1 drop into both eyes daily as needed.     . rosuvastatin (CRESTOR) 40 MG tablet TAKE 1 TABLET (40 MG TOTAL) BY MOUTH DAILY AT 6 PM. 30 tablet 3  . sacubitril-valsartan (ENTRESTO) 24-26 MG Take 1 tablet by mouth 2 (two) times daily. 60 tablet 5  . torsemide (DEMADEX) 20 MG tablet Take 4 tablets (80 mg total) by mouth 2 (two) times daily.  240 tablet 3  . umeclidinium-vilanterol (ANORO ELLIPTA) 62.5-25 MCG/INH AEPB Inhale 1 puff into the lungs daily. 21 each 0  . vitamin B-12 (CYANOCOBALAMIN) 1000 MCG tablet Take 1,000 mcg by mouth daily.    . vitamin C (ASCORBIC ACID) 250 MG tablet Take 2 tablets (500 mg total) by mouth daily. 90 tablet 3  . spironolactone (ALDACTONE) 25 MG tablet TAKE 1 TABLET (25 MG TOTAL) BY MOUTH DAILY. 90 tablet 1   No current facility-administered medications on file prior to visit.    Observations/Objective: Sleep study done 11/2014 showed Mild OSA with AHI at 5.7, mild desats ~82%.   Spirometry 12/2014 showed FEV1 at 34%, ratio 66.  Assessment and Plan: COPD exacerbation   Plan  Patient Instructions  Zpack take as directed.  Chest xray today .  Continue on  ANORO 1 puff daily , rinse after use.  Duoneb every 6hr as needed for wheezing .  Continue on Oxygen 2l/m .  Continue on  BIPAP  At bedtime    Low salt diet .   Work on [employer]  Do not drive if sleepy.  Continue follow up with Cardiology as planned  Follow up with Dr. Hermina Staggers or Goddess Gebbia NP  in 4-6 weeks  and As needed   Please contact office for sooner follow up if symptoms do not improve or worsen or seek emergency care        Follow Up Instructions: Follow up in 4 weeks and As needed   Please contact office for sooner follow up if symptoms do not improve or worsen or seek emergency care     I discussed the assessment and treatment plan with the patient. The patient was provided an opportunity to ask questions and all were answered. The patient agreed with the plan and demonstrated an understanding of the instructions.   The patient was advised to call back or seek an in-person evaluation if the symptoms worsen or if the condition fails to improve as anticipated.  I provided  30 minutes of non-face-to-face time during this encounter.   [name], NP

## 2019-08-09 ENCOUNTER — Telehealth (HOSPITAL_COMMUNITY): Payer: Self-pay

## 2019-08-13 ENCOUNTER — Telehealth: Payer: Self-pay | Admitting: Internal Medicine

## 2019-08-13 ENCOUNTER — Ambulatory Visit: Payer: Medicaid Other | Admitting: Internal Medicine

## 2019-08-13 NOTE — Telephone Encounter (Signed)
Patient called and requested for a telephone visit for her appointment that was at 9:50am, patient was informed her appointment has passed and pcp has a full schedule today . Patient was offered and appointment for 08/21/2019 @ 10:50am for a tele encounter. Patient declined and insisted to be seen today by pcp since she hasn't seen her in a while. Please follow up at your earliest convenience.

## 2019-08-13 NOTE — Telephone Encounter (Signed)
Pt will need to be reschedule due to her appointment being at 950am and decided to call at 1045am to change appointment to tele

## 2019-08-13 NOTE — Telephone Encounter (Signed)
Messages from today noted.  I will have schedulers reach out to patient to get her rescheduled.

## 2019-08-13 NOTE — Telephone Encounter (Signed)
Returned pt call and made aware that she will need to reschedule her appointment. Pt states that is unacceptable that she is needing to see the doctor. I informed pt that her appointment was schedule at 950am and she called at 1045am to change her visit to a tele visit. Pt states that is not current that she was hold for hours.. I went and asked [name] because she took the call and per [facility] pt was holding for 22 mins. I made pt aware and per pt she doesn't like the policy and that people doesn't answer the phones. I informed pt that we can reschedule her appointment and pt stated no and hung up

## 2019-08-14 ENCOUNTER — Encounter (HOSPITAL_COMMUNITY): Payer: Self-pay

## 2019-08-14 ENCOUNTER — Other Ambulatory Visit (HOSPITAL_COMMUNITY): Payer: Self-pay

## 2019-08-14 ENCOUNTER — Other Ambulatory Visit: Payer: Self-pay

## 2019-08-14 ENCOUNTER — Ambulatory Visit (HOSPITAL_COMMUNITY)
Admission: RE | Admit: 2019-08-14 | Discharge: 2019-08-14 | Disposition: A | Discharge status: Home / Self Care | Payer: Medicaid Other | Source: Ambulatory Visit | Attending: Cardiology | Admitting: Cardiology

## 2019-08-14 VITALS — BP 122/66 | HR 78 | Wt 324.0 lb

## 2019-08-14 DIAGNOSIS — Z794 Long term (current) use of insulin: Secondary | ICD-10-CM | POA: Insufficient documentation

## 2019-08-14 DIAGNOSIS — N183 Chronic kidney disease, stage 3 unspecified: Secondary | ICD-10-CM | POA: Insufficient documentation

## 2019-08-14 DIAGNOSIS — Z7982 Long term (current) use of aspirin: Secondary | ICD-10-CM | POA: Insufficient documentation

## 2019-08-14 DIAGNOSIS — Z95810 Presence of automatic (implantable) cardiac defibrillator: Secondary | ICD-10-CM | POA: Insufficient documentation

## 2019-08-14 DIAGNOSIS — I252 Old myocardial infarction: Secondary | ICD-10-CM | POA: Insufficient documentation

## 2019-08-14 DIAGNOSIS — Z86711 Personal history of pulmonary embolism: Secondary | ICD-10-CM | POA: Insufficient documentation

## 2019-08-14 DIAGNOSIS — I2510 Atherosclerotic heart disease of native coronary artery without angina pectoris: Secondary | ICD-10-CM | POA: Insufficient documentation

## 2019-08-14 DIAGNOSIS — Z9981 Dependence on supplemental oxygen: Secondary | ICD-10-CM | POA: Insufficient documentation

## 2019-08-14 DIAGNOSIS — G4733 Obstructive sleep apnea (adult) (pediatric): Secondary | ICD-10-CM | POA: Insufficient documentation

## 2019-08-14 DIAGNOSIS — E785 Hyperlipidemia, unspecified: Secondary | ICD-10-CM | POA: Insufficient documentation

## 2019-08-14 DIAGNOSIS — Z87891 Personal history of nicotine dependence: Secondary | ICD-10-CM | POA: Diagnosis not present

## 2019-08-14 DIAGNOSIS — I130 Hypertensive heart and chronic kidney disease with heart failure and stage 1 through stage 4 chronic kidney disease, or unspecified chronic kidney disease: Secondary | ICD-10-CM | POA: Insufficient documentation

## 2019-08-14 DIAGNOSIS — J449 Chronic obstructive pulmonary disease, unspecified: Secondary | ICD-10-CM | POA: Diagnosis not present

## 2019-08-14 DIAGNOSIS — I272 Pulmonary hypertension, unspecified: Secondary | ICD-10-CM | POA: Insufficient documentation

## 2019-08-14 DIAGNOSIS — I5022 Chronic systolic (congestive) heart failure: Secondary | ICD-10-CM | POA: Diagnosis present

## 2019-08-14 DIAGNOSIS — E1122 Type 2 diabetes mellitus with diabetic chronic kidney disease: Secondary | ICD-10-CM | POA: Insufficient documentation

## 2019-08-14 DIAGNOSIS — R0602 Shortness of breath: Secondary | ICD-10-CM | POA: Diagnosis not present

## 2019-08-14 DIAGNOSIS — Z955 Presence of coronary angioplasty implant and graft: Secondary | ICD-10-CM | POA: Insufficient documentation

## 2019-08-14 DIAGNOSIS — Z8673 Personal history of transient ischemic attack (TIA), and cerebral infarction without residual deficits: Secondary | ICD-10-CM | POA: Diagnosis not present

## 2019-08-14 DIAGNOSIS — Z8674 Personal history of sudden cardiac arrest: Secondary | ICD-10-CM | POA: Insufficient documentation

## 2019-08-14 LAB — BASIC METABOLIC PANEL
Anion gap: 9 (ref 5–15)
BUN: 48 mg/dL — ABNORMAL HIGH (ref 6–20)
CO2: 35 mmol/L — ABNORMAL HIGH (ref 22–32)
Calcium: 8.9 mg/dL (ref 8.9–10.3)
Chloride: 97 mmol/L — ABNORMAL LOW (ref 98–111)
Creatinine, Ser: 1.84 mg/dL — ABNORMAL HIGH (ref 0.44–1.00)
GFR calc Af Amer: 34 mL/min — ABNORMAL LOW (ref >60)
GFR calc non Af Amer: 29 mL/min — ABNORMAL LOW (ref >60)
Glucose, Bld: 203 mg/dL — ABNORMAL HIGH (ref 70–99)
Potassium: 4.5 mmol/L (ref 3.5–5.1)
Sodium: 141 mmol/L (ref 135–145)

## 2019-08-14 MED ORDER — POTASSIUM CHLORIDE CRYS ER 20 MEQ PO TBCR: 40.0000 meq | EXTENDED_RELEASE_TABLET | Freq: Every day | ORAL | 3 refills | Status: DC

## 2019-08-14 MED ORDER — TORSEMIDE 20 MG PO TABS: 100.0000 mg | ORAL_TABLET | Freq: Two times a day (BID) | ORAL | 3 refills | Status: DC

## 2019-08-14 NOTE — Progress Notes (Signed)
Referral form for [facility] faxed along w/OV note to [phone]

## 2019-08-14 NOTE — Telephone Encounter (Signed)
Called pt and made the appointment  °

## 2019-08-14 NOTE — Progress Notes (Signed)
IDGaylen Silva, DOB 10-19-1960, MRN [id number]   Provider location: [facility] Type of Visit: Established patient   PCP:  Tamara Pier, MD  HF Cardiology: Dr Tamara Silva   History of Present Illness: Tamara Silva is a 59 y.o. 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  She has had frequent admits for anemia and CHF with transfusions and diuresis. Prasugrel was stopped. No source of bleeding found on EGD and colonoscopy.   She is on home 02 and nocturnal BiPAP for OHS/OSA and COPD.   She no longer smokes. Hx of PE in 2014.  Hx of bradycardia with Coreg at higher dose.  Echo 05/31/18 with EF 40-45%, PA peak pressure 71 mmHg.    Admitted 10/25/18 with SOB and edema, + productive cough.  This started over a few weeks prior to admission. Hospital course complicated by PEA arrest on 10/28/18. CPR for 25 minutes. Extubated 10/30/18.  Developed AKI.  HF team followed closely to optimize HF.  She was massively volume overloaded and extensively diuresed, weight down 65 lbs total.  Echo showed EF down to 30-35%.  No coronary angiography due to AKI.   Readmitted  11/17/18 with lower extremity edema and volume overload. Diuresed with IV lasix and transitioned to torsemide 40 mg twice daily. Discharged on 11/21/18 to home on oxygen.   She had RHC/LHC in 9/20, most significant stenosis was 75% ostial D1.  There was mild to moderate nonobstructive disease in other vessels. There was not an interventional target.  Filling pressures were mildly elevated with preserved cardiac output.  Torsemide was increased.   Echo in 10/20 showed that EF remained 30-35% with wall motion abnormalities, RV appeared normal.   Medtronic ICD placed in 11/20.   Had recent clinic f/u 2/21 and was volume overload on exam and based on Optivol and ReDs clip, 44%. Was symptomatic w/  exertional dyspnea and 3 pillow orthopnea. Torsemide was increased to 80 mg bid.  She presents back to clinic today for f/u to reassess response to diuretic increase and volume status. Her wt has actually increased from 315>>324 lb. ReDs clip reading has increased to 50%. She denies resting dyspnea. On chronic home O2 for COPD, 3L/min baseline. Has not had to increase level. No dyspnea w/ ADLs but she gets SOB walking longer distances. She reports compliance w/ meds but admits to dietary indiscretion w/ sodium. Has been seasoning her food w/ [employer].  ReDS clip: 50%  ECG (personally reviewed): Not performed   Labs (7/20): K 3.7, creatinine 1.56 Labs (9/20): K 3.7, creatinine 1.46, LDL 52, HDL 56  Labs (12/20): K 3.8, creatinine 1.51 Labs (1/21): hgb 10.6, K 4.4, creatinine 1.91 Labs (2/21) K 4.2, creatinine 1.72   PMH: 1. CVA (1/20). 2. Type 2 diabetes 3. HTN 4. H/o seizures 5. PEA arrest (5/20) with CPR.  6. COPD: Home oxygen.  Stopped smoking in 1/15.  7. CAD: PCI [facility] 2006, [facility] 2009.  - Inferior STEMI 5/15, DES to RCA.  - Poor responder to Plavix.  - LHC (9/20): 75% ostial D1, 40-50% mLCx, 40% proximal PLV.  8. Hyperlipidemia 9. PE: 2014, LLL.  10. OHS/OSA: Home oxygen and Bipap used.  11. Chronic systolic CHF: Suspect primarily ischemic cardiomyopathy. Medtronic ICD.  - Echo (5/15): EF 35-40% -  Echo (2/16): EF 40-45% - Echo (1/20): EF 40-45% - Echo (5/20): EF 30-35%, mild RV dilation.  - RHC (5/20): mean RA 9, PA 62/22 mean 37, mean PCWP 15, CI 4.29, PVR 2.1 WU.  - RHC (9/20): mean RA 11, PA 66/18 mean 37, mean PCWP 19, CI 3.12, PVR 2.4 WU - Echo (10/20): EF 30-35% with wall motion abnormalities, normal RV.  12. CKD: Stage 3.  13. Fe deficiency anemia: EGD and colonoscopy without definite source of bleeding.   ROS: All systems negative except as listed in HPI, PMH and Problem List.  SH:  Social History   Socioeconomic History  . Marital  status: Single    Spouse name: Not on file  . Number of children: 1  . Years of education: Not on file  . Highest education level: Not on file  Occupational History  . Not on file  Tobacco Use  . Smoking status: Former Smoker    Packs/day: 0.50    Years: 20.00    Pack years: 10.00    Types: Cigarettes    Quit date: 05/31/2018    Years since quitting: 1.2  . Smokeless tobacco: Never Used  . Tobacco comment: smoked off and on x 20 years  Substance and Sexual Activity  . Alcohol use: No    Alcohol/week: 0.0 standard drinks  . Drug use: Not Currently    Types: Cocaine  . Sexual activity: Not Currently  Other Topics Concern  . Not on file  Social History Narrative   Origibnally from [place]   Most recently from [place]   Daughter lives in town      On Social security to CHF, COPD, CAD   Social Determinants of Health   Financial Resource Strain:   . Difficulty of Paying Living Expenses:   Food Insecurity:   . Worried About Charity fundraiser in the Last Year:   . Arboriculturist in the Last Year:   Transportation Needs:   . Film/video editor (Medical):   [name] Kitchen Lack of Transportation (Non-Medical):   Physical Activity:   . Days of Exercise per Week:   . Minutes of Exercise per Session:   Stress:   . Feeling of Stress :   Social Connections:   . Frequency of Communication with Friends and Family:   . Frequency of Social Gatherings with Friends and Family:   . Attends Religious Services:   . Active Member of Clubs or Organizations:   . Attends Archivist Meetings:   [name] Kitchen Marital Status:   Intimate Partner Violence:   . Fear of Current or Ex-Partner:   . Emotionally Abused:   [name] Kitchen Physically Abused:   . Sexually Abused:     FH:  Family History  Problem Relation Age of Onset  . Stroke Mother   . Hypertension Mother   . Colon cancer Father   . Diabetes Father   . Heart attack Father   . Sarcoidosis Other   . Breast cancer Paternal Aunt   . Emphysema  Brother   . Lung disease Brother        Unknown type, 3 brothers, one with liver and lung disease  . Diabetes Brother   . Asthma Paternal Aunt   . Diabetes Sister     Current Outpatient Medications  Medication Sig Dispense Refill  . ACCU-CHEK AVIVA PLUS test strip USE 1 STRIP TO CHECK BLOOD GLUCOSE 3 TIMES A DAY 100 each 2  . acetaminophen (TYLENOL) 500 MG  tablet Take 500 mg by mouth every 6 (six) hours as needed for moderate pain or headache.    . albuterol (PROAIR HFA) 108 (90 Base) MCG/ACT inhaler INHALE 2 PUFFS BY MOUTH 4 TIMES DAILY AS NEEDED 18 g 11  . aspirin EC 81 MG tablet Take 1 tablet (81 mg total) by mouth daily. 90 tablet 3  . bisoprolol (ZEBETA) 5 MG tablet Take 1 tablet (5 mg total) by mouth daily. 30 tablet 6  . EASY COMFORT PEN NEEDLES 31G X 5 MM MISC USE 3 TIMES A DAY FOR INSULIN ADMINISTRATION 100 each 12  . ferrous sulfate 325 (65 FE) MG tablet Take 1 tablet (325 mg total) by mouth daily after supper. Please take with a source of vitamin C 90 tablet 3  . gabapentin (NEURONTIN) 300 MG capsule Take 1 capsule (300 mg total) by mouth 3 (three) times daily. Start with 1 capsule at bedtime for 7 days then increase to 3 times a day if tolerated 90 capsule 3  . hydrocerin (EUCERIN) CREA Apply 1 application topically 2 (two) times daily.  0  . Insulin Degludec (TRESIBA FLEXTOUCH) 200 UNIT/ML SOPN Inject 64 Units into the skin daily. 15 mL 2  . ipratropium-albuterol (DUONEB) 0.5-2.5 (3) MG/3ML SOLN Use nebulizer to inhale contents of one vial every 6 hours as needed for shortness of breath, wheezing 360 mL 11  . isosorbide-hydrALAZINE (BIDIL) 20-37.5 MG tablet Take 1 tablet by mouth 3 (three) times daily. 90 tablet 6  . liraglutide (VICTOZA) 18 MG/3ML SOPN INJECT 1.8 MG INTO THE SKIN DAILY. 9 mL 2  . NEEDLE, DISP, 26 G 26G X 1/2" MISC 1 application by Does not apply route daily. 100 each 0  . NOVOLOG FLEXPEN 100 UNIT/ML FlexPen Inject 18 units under the skin three times daily before  meals. May also take 4-6 units before bedtime with snacks. 30 mL 2  . omeprazole (PRILOSEC) 40 MG capsule TAKE 1 CAPSULE (40 MG TOTAL) BY MOUTH DAILY. 30 capsule 2  . PAZEO 0.7 % SOLN Place 1 drop into both eyes daily.     . potassium chloride SA (KLOR-CON) 20 MEQ tablet TAKE 1 TABLET (20 MEQ TOTAL) BY MOUTH DAILY. 30 tablet 3  . RESTASIS 0.05 % ophthalmic emulsion Place 1 drop into both eyes daily as needed.     . rosuvastatin (CRESTOR) 40 MG tablet TAKE 1 TABLET (40 MG TOTAL) BY MOUTH DAILY AT 6 PM. 30 tablet 3  . sacubitril-valsartan (ENTRESTO) 24-26 MG Take 1 tablet by mouth 2 (two) times daily. 60 tablet 5  . spironolactone (ALDACTONE) 25 MG tablet TAKE 1 TABLET (25 MG TOTAL) BY MOUTH DAILY. 90 tablet 1  . torsemide (DEMADEX) 20 MG tablet Take 4 tablets (80 mg total) by mouth 2 (two) times daily. 240 tablet 3  . umeclidinium-vilanterol (ANORO ELLIPTA) 62.5-25 MCG/INH AEPB Inhale 1 puff into the lungs daily. 21 each 0  . vitamin B-12 (CYANOCOBALAMIN) 1000 MCG tablet Take 1,000 mcg by mouth daily.    . vitamin C (ASCORBIC ACID) 250 MG tablet Take 2 tablets (500 mg total) by mouth daily. 90 tablet 3   No current facility-administered medications for this encounter.    Vitals:   08/14/19 1201  BP: 122/66  Pulse: 78  SpO2: (!) 89%  Weight: (!) 147 kg (324 lb)   Wt Readings from Last 3 Encounters:  08/14/19 (!) 147 kg (324 lb)  07/24/19 (!) 143.1 kg (315 lb 6.4 oz)  07/13/19 (!) 141.3 kg (311 lb  9.6 oz)   PHYSICAL EXAM: General:  Well appearing, morbidly obese AAF on supp O2. No respiratory difficulty HEENT: normal Neck: supple. Thick neck, JVD assessment difficult. Carotids 2+ bilat; no bruits. No lymphadenopathy or thyromegaly appreciated. Cor: PMI nondisplaced. Regular rate & rhythm. No rubs, gallops or murmurs. Lungs: clear Abdomen: obese, soft, nontender, nondistended. No hepatosplenomegaly. No bruits or masses. Good bowel sounds. Extremities: no cyanosis, clubbing, rash,  edema Neuro: alert & oriented x 3, cranial nerves grossly intact. moves all 4 extremities w/o difficulty. Affect pleasant.   ASSESSMENT & PLAN: 1. H/O PEA arrest: Due to severe hypoxemia in 5/20 in setting of CHF exacerbation.   2. Chronic systolic CHF: Suspect ischemic cardiomyopathy. Medtronic ICD.  Echo in 1/20 with EF 40-45%, RV moderately dilated, PASP 71 mmHg. Echo in 5/20 with lower EF, 30-35%, and mildly dilated RV. Suspect significant RV failure, may be related to OHS/OSA and COPD. She was massively volume overloaded during 5/20 admission and diuresed extensively.  RHC done 11/06/18 showed high cardiac output and minimally elevated filling pressures after diuresis.  PVR was not elevated, appeared to be high output PH.  RHC was done again in 9/20, filling pressures were elevated with preserved cardiac output.  Echo in 10/20 showed EF still 30-35%.   - Recent diuretic increase, per above, for volume overload. Remains volume overloaded based on wt trend and ReDs Clip, now at 50%. Despite volume, functional status fairly unchanged. Remains chronically, NYHA Class III. Denies resting dyspnea. Diuresis difficult due to dietary indiscretion w/ sodium. We discussed importance of strict sodium restriction.  - Will increase torsemide further to 100 mg bid and increase KCl to 40 mEq daily. Check BMP today and again in 7-10 days.  - Continue w/ Paramadicine. Will refer to [facility] for HF at Home Management for IV Lasix - Continue spironolactone 25 mg daily.  - Continue Entresto 24/26 bid. - Given significant lung disease/COPD, she is on bisoprolol 5 mg daily (more beta-1 selective).  3. CKD: Stage III. Last creatinine 1.7   - repeat BMP today planned diuretic increase 4. COPD: On home oxygen at baseline. Has quit smoking.  5. OHS/OSA: On home oxygen at all times. Admit that compliance w/ BiPAP is poor.  - Encouraged to improve compliance.  6. CAD: History of PCI, most recently had inferior  STEMI in 5/15 with DES to RCA. With fall in EF this year, coronary angiography was done in 9/20.  There was 75% ostial D1 stenosis but no good target for PCI. No chest pain.  - Continue ASA 81 and Crestor 40 daily.  Good lipids in 9/20.    7. Remote PE: She has not been anticoagulated due to h/o GI bleeding.  8. Anemia: History of GI bleeding, had endoscopies earlier this year with no bleeding source found. Also with history of B12 deficiency.  9. Pulmonary hypertension: Pulmonary venous hypertension on last RHC in 9/20.  - increase diuretics per above   Recommended follow-up:  F/u again in10 days to reassess volume status  Signed, Lyda Jester, PA-C  08/14/2019  [place] [address] [phone] (office) [phone] (fax)

## 2019-08-14 NOTE — Patient Instructions (Signed)
INCREASE Torsemide to 100 mg (5 tabs) twice daily INCREASE Potassium to 40 MEQ (2 tabs) daily  Labs today We will only contact you if something comes back abnormal or we need to make some changes. Otherwise no news is good news!  A chest x-ray takes a picture of the organs and structures inside the chest, including the heart, lungs, and blood vessels. This test can show several things, including, whether the heart is enlarges; whether fluid is building up in the lungs; and whether pacemaker / defibrillator leads are still in place.  Your physician recommends that you schedule a follow-up appointment in: 10-14 days  in the Advanced Practitioners (PA/NP) Clinic    Do the following things EVERYDAY: 1) Weigh yourself in the morning before breakfast. Write it down and keep it in a log. 2) Take your medicines as prescribed 3) Eat low salt foods--Limit salt (sodium) to 2000 mg per day.  4) Stay as active as you can everyday 5) Limit all fluids for the day to less than 2 liters  At the [facility], you and your health needs are our priority. As part of our continuing mission to provide you with exceptional heart care, we have created designated Provider Care Teams. These Care Teams include your primary Cardiologist (physician) and Advanced Practice Providers (APPs- Physician Assistants and Nurse Practitioners) who all work together to provide you with the care you need, when you need it.   You may see any of the following providers on your designated Care Team at your next follow up: [name] Kitchen Dr Glori Bickers . Dr Loralie Champagne . Darrick Grinder, NP . Lyda Jester, PA . Audry Riles, PharmD   Please be sure to bring in all your medications bottles to every appointment.

## 2019-08-14 NOTE — Progress Notes (Signed)
Paramedicine Encounter   Patient ID: Tamara Silva , female,   DOB: 1961/02/21,58 y.o.,  MRN: [id number]  [name] was seen in the HF clinic today with Lyda Jester, PA-C. She reported feeling generally well but per Brittainy was in fluid overload. She is being referred to [facility] for IV Lasix and her torsemide if being increased to 100 mg BID and potassium to 40 mEq daily. I will follow up next Thursday at 09:00.   Jacquiline Doe, EMT 08/14/2019   ACTION: Home visit completed Next visit planned for 1 week

## 2019-08-14 NOTE — Progress Notes (Signed)
ReDS Vest / Clip - 08/14/19 1200      ReDS Vest / Clip   Station Marker  D    Ruler Value  34    ReDS Value Range  (!) High volume overload    ReDS Actual Value  50

## 2019-08-14 NOTE — Telephone Encounter (Signed)
I called Ms Nannini to schedule an appointment. She did not answer and there was no answering machine available to leave a message. I will follow up next week.   Jacquiline Doe, EMT 08/14/19

## 2019-08-14 NOTE — Addendum Note (Signed)
Encounter addended by: Scarlette Calico, RN on: 08/14/2019 1:05 PM  Actions taken: Clinical Note Signed

## 2019-08-14 NOTE — Addendum Note (Signed)
Encounter addended by: Kerry Dory, CMA on: 08/14/2019 3:00 PM  Actions taken: Charge Capture section accepted

## 2019-08-16 ENCOUNTER — Telehealth (HOSPITAL_COMMUNITY): Payer: Self-pay

## 2019-08-16 MED ORDER — TORSEMIDE 100 MG PO TABS: 100.0000 mg | ORAL_TABLET | Freq: Two times a day (BID) | ORAL | 4 refills | Status: DC

## 2019-08-16 NOTE — Telephone Encounter (Signed)
Received call from [facility] stating quantity insufficient on script for Torsemide. Pt to take 100mg  5 tabs bid however only 30 pills sent.  New script sent with 100mg  tabs 1 tab bid 60 tabs.

## 2019-08-17 ENCOUNTER — Telehealth (HOSPITAL_COMMUNITY): Payer: Self-pay | Admitting: Cardiology

## 2019-08-17 MED ORDER — AMOXICILLIN-POT CLAVULANATE 500-125 MG PO TABS: 500.0000 mg | ORAL_TABLET | Freq: Three times a day (TID) | ORAL | 0 refills | Status: AC

## 2019-08-17 NOTE — Telephone Encounter (Signed)
[id number] (M) pt aware and voiced understanding   Tamara Silva  08/16/2019 8:55 PM EDT    CXR concerning for possible pneumonia. Start antibiotics for community acquired PNA. Start Augmentin 500 mg tid x 7 days. F/u in 1 week.

## 2019-08-17 NOTE — Telephone Encounter (Signed)
-----   Message from Consuelo Pandy, [place] sent at 08/16/2019  8:55 PM EDT ----- CXR concerning for possible pneumonia. Start antibiotics for community acquired PNA. Start Augmentin 500 mg tid x 7 days. F/u in 1 week.

## 2019-08-20 ENCOUNTER — Telehealth (HOSPITAL_COMMUNITY): Payer: Self-pay | Admitting: Cardiology

## 2019-08-20 NOTE — Telephone Encounter (Signed)
Spoke with Aliison at [facility] to confirm patient is enrolled in the HF program -will send update and labs as [place]

## 2019-08-21 ENCOUNTER — Ambulatory Visit: Payer: Medicaid Other | Attending: Internal Medicine | Admitting: Internal Medicine

## 2019-08-21 ENCOUNTER — Other Ambulatory Visit: Payer: Self-pay

## 2019-08-21 DIAGNOSIS — Z1211 Encounter for screening for malignant neoplasm of colon: Secondary | ICD-10-CM

## 2019-08-21 DIAGNOSIS — E1142 Type 2 diabetes mellitus with diabetic polyneuropathy: Secondary | ICD-10-CM | POA: Diagnosis not present

## 2019-08-21 DIAGNOSIS — J302 Other seasonal allergic rhinitis: Secondary | ICD-10-CM | POA: Diagnosis not present

## 2019-08-21 DIAGNOSIS — I5022 Chronic systolic (congestive) heart failure: Secondary | ICD-10-CM | POA: Diagnosis not present

## 2019-08-21 DIAGNOSIS — IMO0002 Reserved for concepts with insufficient information to code with codable children: Secondary | ICD-10-CM

## 2019-08-21 DIAGNOSIS — E1165 Type 2 diabetes mellitus with hyperglycemia: Secondary | ICD-10-CM | POA: Diagnosis not present

## 2019-08-21 MED ORDER — VICTOZA 18 MG/3ML ~~LOC~~ SOPN: PEN_INJECTOR | SUBCUTANEOUS | 2 refills | Status: DC

## 2019-08-21 MED ORDER — FLUTICASONE PROPIONATE 50 MCG/ACT NA SUSP: 1.0000 | Freq: Every day | NASAL | 1 refills | Status: DC

## 2019-08-21 MED ORDER — TRESIBA FLEXTOUCH 200 UNIT/ML ~~LOC~~ SOPN: 64.0000 [IU] | PEN_INJECTOR | Freq: Every day | SUBCUTANEOUS | 2 refills | Status: DC

## 2019-08-21 MED ORDER — NOVOLOG FLEXPEN 100 UNIT/ML ~~LOC~~ SOPN: PEN_INJECTOR | SUBCUTANEOUS | 4 refills | Status: DC

## 2019-08-21 NOTE — Progress Notes (Signed)
Virtual Visit via Telephone Note Due to current restrictions/limitations of in-office visits due to the COVID-19 pandemic, this scheduled clinical appointment was converted to a telehealth visit  I connected with Tamara Silva on 08/21/19 at 9:53 a.m by telephone and verified that I am speaking with the correct person using two identifiers. I am in my office.  The patient is at home.  Only the patient and myself participated in this encounter.  I discussed the limitations, risks, security and privacy concerns of performing an evaluation and management service by telephone and the availability of in person appointments. I also discussed with the patient that there may be a patient responsible charge related to this service. The patient expressed understanding and agreed to proceed.   History of Present Illness: HTN: Combined chronic CHF, cardiac arrest, CAD, CVA, chronic PE, OHS (noncompliant with BiPAP),COPD, hypoxic respiratory failure on home O2 3 Lt continuous,  DM, obesity, CKD stage III, former tob dep, IDA (history GI bleed with endoscopies2/2020- poor prep of colon on c-scope) Vit B12 def, Patient last evaluated by me 03/2019.  Purpose of today's visit is chronic disease management.  She had missed her most recent visit with me about a week or two ago.  DM:  Saw Dr. Dwyane Dee 05/2019. Placed on Tresiba 64 units.  Still on Novolog and Victozia.  Checking BS three a day.  Readings for past 4 mornings -181, 176, 369, 107.  Lunch - 199, 132, 205.  Before dinner -237, 168, 152, 173.  Feels BS higher because she currently has pneumonia.  Doing bettter with eating habits. Over due for f/u with Dr. Dwyane Dee.  CAD/CHF: Patient is enrolled in the CHF program.  When she was last seen by the cardiologist she was fluid overloaded so they requested the services of [facility] to give her IV Lasix.  Torsemide was subsequently increased to 100 mg twice a day and potassium supplement to 40 mEq daily.  Since  then, she reports that her wgh decreased from 326 to 323 lbs. Limiting salt  Currently on abx for pneumonia.  Requests prescription for Flonase for her sinuses.  States that her sinus issue gets bad during this time of year.  Needs referral for colonoscopy.  Received letter from [facility] saying she is due for c-scope.  Father and sister had colon CA.  She requests referral for the colonoscopy but wants to have it done through the bowel.  She does not want to go back to [facility].  Outpatient Encounter Medications as of 08/21/2019  Medication Sig  . ACCU-CHEK AVIVA PLUS test strip USE 1 STRIP TO CHECK BLOOD GLUCOSE 3 TIMES A DAY  . acetaminophen (TYLENOL) 500 MG tablet Take 500 mg by mouth every 6 (six) hours as needed for moderate pain or headache.  . albuterol (PROAIR HFA) 108 (90 Base) MCG/ACT inhaler INHALE 2 PUFFS BY MOUTH 4 TIMES DAILY AS NEEDED  . amoxicillin-clavulanate (AUGMENTIN) 500-125 MG tablet Take 1 tablet (500 mg total) by mouth 3 (three) times daily for 7 days.  [name] Kitchen aspirin EC 81 MG tablet Take 1 tablet (81 mg total) by mouth daily.  . bisoprolol (ZEBETA) 5 MG tablet Take 1 tablet (5 mg total) by mouth daily.  [name] Kitchen EASY COMFORT PEN NEEDLES 31G X 5 MM MISC USE 3 TIMES A DAY FOR INSULIN ADMINISTRATION  . ferrous sulfate 325 (65 FE) MG tablet Take 1 tablet (325 mg total) by mouth daily after supper. Please take with a source of vitamin C  .  gabapentin (NEURONTIN) 300 MG capsule Take 1 capsule (300 mg total) by mouth 3 (three) times daily. Start with 1 capsule at bedtime for 7 days then increase to 3 times a day if tolerated  . hydrocerin (EUCERIN) CREA Apply 1 application topically 2 (two) times daily.  . Insulin Degludec (TRESIBA FLEXTOUCH) 200 UNIT/ML SOPN Inject 64 Units into the skin daily.  [name] Kitchen ipratropium-albuterol (DUONEB) 0.5-2.5 (3) MG/3ML SOLN Use nebulizer to inhale contents of one vial every 6 hours as needed for shortness of breath, wheezing  . isosorbide-hydrALAZINE (BIDIL)  20-37.5 MG tablet Take 1 tablet by mouth 3 (three) times daily.  [name] Kitchen liraglutide (VICTOZA) 18 MG/3ML SOPN INJECT 1.8 MG INTO THE SKIN DAILY.  [name] Kitchen NEEDLE, DISP, 26 G 26G X 1/2" MISC 1 application by Does not apply route daily.  [name] Kitchen NOVOLOG FLEXPEN 100 UNIT/ML FlexPen Inject 18 units under the skin three times daily before meals. May also take 4-6 units before bedtime with snacks.  [name] Kitchen omeprazole (PRILOSEC) 40 MG capsule TAKE 1 CAPSULE (40 MG TOTAL) BY MOUTH DAILY.  [name] Kitchen PAZEO 0.7 % SOLN Place 1 drop into both eyes daily.   . potassium chloride SA (KLOR-CON) 20 MEQ tablet Take 2 tablets (40 mEq total) by mouth daily.  . RESTASIS 0.05 % ophthalmic emulsion Place 1 drop into both eyes daily as needed.   . rosuvastatin (CRESTOR) 40 MG tablet TAKE 1 TABLET (40 MG TOTAL) BY MOUTH DAILY AT 6 PM.  . sacubitril-valsartan (ENTRESTO) 24-26 MG Take 1 tablet by mouth 2 (two) times daily.  [name] Kitchen spironolactone (ALDACTONE) 25 MG tablet TAKE 1 TABLET (25 MG TOTAL) BY MOUTH DAILY.  [name] Kitchen torsemide (DEMADEX) 100 MG tablet Take 1 tablet (100 mg total) by mouth 2 (two) times daily.  [name] Kitchen umeclidinium-vilanterol (ANORO ELLIPTA) 62.5-25 MCG/INH AEPB Inhale 1 puff into the lungs daily.  . vitamin B-12 (CYANOCOBALAMIN) 1000 MCG tablet Take 1,000 mcg by mouth daily.  . vitamin C (ASCORBIC ACID) 250 MG tablet Take 2 tablets (500 mg total) by mouth daily.   No facility-administered encounter medications on file as of 08/21/2019.      Observations/Objective: .  Assessment and Plan: 1. Uncontrolled type 2 diabetes mellitus with peripheral neuropathy ([place]) Refill given on Tresiba and NovoLog.  We will try to get her back in with Dr. Dwyane Dee for follow-up. - insulin degludec (TRESIBA FLEXTOUCH) 200 UNIT/ML FlexTouch Pen; Inject 64 Units into the skin daily.  Dispense: 15 mL; Refill: 2 - Ambulatory referral to Endocrinology - NOVOLOG FLEXPEN 100 UNIT/ML FlexPen; Inject 18 units under the skin three times daily before meals. May also take 4-6  units before bedtime with snacks.  Dispense: 30 mL; Refill: 4 - liraglutide (VICTOZA) 18 MG/3ML SOPN; INJECT 1.8 MG INTO THE SKIN DAILY.  Dispense: 9 mL; Refill: 2  2. Chronicd systolic congestive heart failure, EF 30-35% Followed by cardiology.  3. Seasonal allergies - fluticasone (FLONASE) 50 MCG/ACT nasal spray; Place 1 spray into both nostrils daily.  Dispense: 16 g; Refill: 1  4. Screening for colon cancer - Ambulatory referral to Gastroenterology   Follow Up Instructions: 1 mth in person   I discussed the assessment and treatment plan with the patient. The patient was provided an opportunity to ask questions and all were answered. The patient agreed with the plan and demonstrated an understanding of the instructions.   The patient was advised to call back or seek an in-person evaluation if the symptoms worsen or if the condition fails to improve as anticipated.  I provided 25 minutes of non-face-to-face time during this encounter.   Karle Plumber, MD

## 2019-08-23 ENCOUNTER — Other Ambulatory Visit (HOSPITAL_COMMUNITY): Payer: Self-pay

## 2019-08-23 NOTE — Progress Notes (Signed)
Paramedicine Encounter    Patient ID: Tamara Silva, female    DOB: 1961-02-29, 59 y.o.   MRN: [id number]   Patient Care Team: Ladell Pier, MD as PCP - General (Internal Medicine) Larey Dresser, MD as PCP - Advanced Heart Failure (Cardiology) Constance Haw, MD as PCP - Electrophysiology (Cardiology) Sueanne Margarita, MD as PCP - Cardiology (Cardiology)  Patient Active Problem List   Diagnosis Date Noted  . Chronic systolic (congestive) heart failure ([place]) 04/04/2019  . Diabetic peripheral neuropathy ([place]) 01/19/2019  . Former smoker 01/19/2019  . CKD (chronic kidney disease) stage 3, GFR 30-59 ml/min 01/19/2019  . Cardiac arrest ([place]) 10/28/2018  . Seizure ([place]) 10/28/2018  . Noncompliance with treatment plan 07/24/2018  . General patient noncompliance 07/24/2018  . Noncompliance with diet and medication regimen 07/24/2018  . Dysarthria   . BiPAP (biphasic positive airway pressure) dependence   . Pancytopenia ([place]) 07/08/2018  . Type II diabetes mellitus with renal manifestations ([place]) 07/08/2018  . Hepatic steatosis 06/22/2018  . Chronic respiratory failure with hypoxia and hypercapnia (HCC)   . On home O2   . Slow transit constipation   . Cocaine abuse ([place])   . Diabetes mellitus type 2 in obese ([place])   . Generalized tonic-clonic seizure ([place]) 06/05/2018  . Cerebral embolism with cerebral infarction 06/01/2018  . Pressure injury of skin 05/30/2018  . Iron deficiency anemia 01/27/2018  . Tobacco abuse disorder 10/03/2017  . Dyslipidemia 04/29/2016  . Primary insomnia 04/29/2016  . Obesity hypoventilation syndrome ([place])   . OSA (obstructive sleep apnea)   . Dyslipidemia associated with type 2 diabetes mellitus ([place]) 05/21/2015  . COPD GOLD III  06/10/2014  . CAD S/P percutaneous coronary angioplasty - prior PCI to LAD; RCA PCI: new Xience Alpine DES 2.75 mm x 15 mm  10/07/2013  . Morbid obesity ([place]) 07/30/2013  . Chronic combined systolic and diastolic  heart failure ([place]) 07/10/2013  . Chronic pulmonary embolism ([place]) 02/08/2013  . Bipolar disorder ([place]) 02/08/2013  . Essential hypertension 02/08/2013  . COPD (chronic obstructive pulmonary disease) ([place]) 10/15/2012  . Pulmonary embolism on right ([place]) 10/15/2012    Current Outpatient Medications:  .  ACCU-CHEK AVIVA PLUS test strip, USE 1 STRIP TO CHECK BLOOD GLUCOSE 3 TIMES A DAY, Disp: 100 each, Rfl: 2 .  acetaminophen (TYLENOL) 500 MG tablet, Take 500 mg by mouth every 6 (six) hours as needed for moderate pain or headache., Disp: , Rfl:  .  albuterol (PROAIR HFA) 108 (90 Base) MCG/ACT inhaler, INHALE 2 PUFFS BY MOUTH 4 TIMES DAILY AS NEEDED, Disp: 18 g, Rfl: 11 .  amoxicillin-clavulanate (AUGMENTIN) 500-125 MG tablet, Take 1 tablet (500 mg total) by mouth 3 (three) times daily for 7 days., Disp: 21 tablet, Rfl: 0 .  aspirin EC 81 MG tablet, Take 1 tablet (81 mg total) by mouth daily., Disp: 90 tablet, Rfl: 3 .  bisoprolol (ZEBETA) 5 MG tablet, Take 1 tablet (5 mg total) by mouth daily., Disp: 30 tablet, Rfl: 6 .  ferrous sulfate 325 (65 FE) MG tablet, Take 1 tablet (325 mg total) by mouth daily after supper. Please take with a source of vitamin C, Disp: 90 tablet, Rfl: 3 .  fluticasone (FLONASE) 50 MCG/ACT nasal spray, Place 1 spray into both nostrils daily., Disp: 16 g, Rfl: 1 .  gabapentin (NEURONTIN) 300 MG capsule, Take 1 capsule (300 mg total) by mouth 3 (three) times daily. Start with 1 capsule at bedtime for 7 days then  increase to 3 times a day if tolerated, Disp: 90 capsule, Rfl: 3 .  hydrocerin (EUCERIN) CREA, Apply 1 application topically 2 (two) times daily., Disp: , Rfl: 0 .  insulin degludec (TRESIBA FLEXTOUCH) 200 UNIT/ML FlexTouch Pen, Inject 64 Units into the skin daily., Disp: 15 mL, Rfl: 2 .  ipratropium-albuterol (DUONEB) 0.5-2.5 (3) MG/3ML SOLN, Use nebulizer to inhale contents of one vial every 6 hours as needed for shortness of breath, wheezing, Disp: 360 mL, Rfl:  11 .  isosorbide-hydrALAZINE (BIDIL) 20-37.5 MG tablet, Take 1 tablet by mouth 3 (three) times daily., Disp: 90 tablet, Rfl: 6 .  liraglutide (VICTOZA) 18 MG/3ML SOPN, INJECT 1.8 MG INTO THE SKIN DAILY., Disp: 9 mL, Rfl: 2 .  NOVOLOG FLEXPEN 100 UNIT/ML FlexPen, Inject 18 units under the skin three times daily before meals. May also take 4-6 units before bedtime with snacks., Disp: 30 mL, Rfl: 4 .  omeprazole (PRILOSEC) 40 MG capsule, TAKE 1 CAPSULE (40 MG TOTAL) BY MOUTH DAILY., Disp: 30 capsule, Rfl: 2 .  potassium chloride SA (KLOR-CON) 20 MEQ tablet, Take 2 tablets (40 mEq total) by mouth daily., Disp: 60 tablet, Rfl: 3 .  RESTASIS 0.05 % ophthalmic emulsion, Place 1 drop into both eyes daily as needed. , Disp: , Rfl:  .  rosuvastatin (CRESTOR) 40 MG tablet, TAKE 1 TABLET (40 MG TOTAL) BY MOUTH DAILY AT 6 PM., Disp: 30 tablet, Rfl: 3 .  sacubitril-valsartan (ENTRESTO) 24-26 MG, Take 1 tablet by mouth 2 (two) times daily., Disp: 60 tablet, Rfl: 5 .  spironolactone (ALDACTONE) 25 MG tablet, TAKE 1 TABLET (25 MG TOTAL) BY MOUTH DAILY., Disp: 90 tablet, Rfl: 1 .  torsemide (DEMADEX) 100 MG tablet, Take 1 tablet (100 mg total) by mouth 2 (two) times daily., Disp: 60 tablet, Rfl: 4 .  umeclidinium-vilanterol (ANORO ELLIPTA) 62.5-25 MCG/INH AEPB, Inhale 1 puff into the lungs daily., Disp: 21 each, Rfl: 0 .  vitamin B-12 (CYANOCOBALAMIN) 1000 MCG tablet, Take 1,000 mcg by mouth daily., Disp: , Rfl:  .  vitamin C (ASCORBIC ACID) 250 MG tablet, Take 2 tablets (500 mg total) by mouth daily., Disp: 90 tablet, Rfl: 3 .  EASY COMFORT PEN NEEDLES 31G X 5 MM MISC, USE 3 TIMES A DAY FOR INSULIN ADMINISTRATION, Disp: 100 each, Rfl: 12 .  NEEDLE, DISP, 26 G 26G X 1/2" MISC, 1 application by Does not apply route daily., Disp: 100 each, Rfl: 0 .  PAZEO 0.7 % SOLN, Place 1 drop into both eyes daily. , Disp: , Rfl:  Allergies  Allergen Reactions  . Metolazone Other (See Comments)    Dizziness and falling  . Plavix  [Clopidogrel Bisulfate] Other (See Comments)    High PRU's - non-responder  . Ibuprofen Other (See Comments)    Pt states she is not supposed to take ibuprofen because of other meds she is taking  . Nsaids Other (See Comments)    "I do not take NSAIDs d/t interfering with other meds"      Social History   Socioeconomic History  . Marital status: Single    Spouse name: Not on file  . Number of children: 1  . Years of education: Not on file  . Highest education level: Not on file  Occupational History  . Not on file  Tobacco Use  . Smoking status: Former Smoker    Packs/day: 0.50    Years: 20.00    Pack years: 10.00    Types: Cigarettes    Quit  date: 05/31/2018    Years since quitting: 1.2  . Smokeless tobacco: Never Used  . Tobacco comment: smoked off and on x 20 years  Substance and Sexual Activity  . Alcohol use: No    Alcohol/week: 0.0 standard drinks  . Drug use: Not Currently    Types: Cocaine  . Sexual activity: Not Currently  Other Topics Concern  . Not on file  Social History Narrative   Origibnally from [place]   Most recently from [place]   Daughter lives in town      On Social security to CHF, COPD, CAD   Social Determinants of Health   Financial Resource Strain:   . Difficulty of Paying Living Expenses:   Food Insecurity:   . Worried About Charity fundraiser in the Last Year:   . Arboriculturist in the Last Year:   Transportation Needs:   . Film/video editor (Medical):   [name] Kitchen Lack of Transportation (Non-Medical):   Physical Activity:   . Days of Exercise per Week:   . Minutes of Exercise per Session:   Stress:   . Feeling of Stress :   Social Connections:   . Frequency of Communication with Friends and Family:   . Frequency of Social Gatherings with Friends and Family:   . Attends Religious Services:   . Active Member of Clubs or Organizations:   . Attends Archivist Meetings:   [name] Kitchen Marital Status:   Intimate Partner Violence:    . Fear of Current or Ex-Partner:   . Emotionally Abused:   [name] Kitchen Physically Abused:   . Sexually Abused:     Physical Exam Cardiovascular:     Rate and Rhythm: Normal rate and regular rhythm.     Pulses: Normal pulses.  Pulmonary:     Effort: Pulmonary effort is normal.     Breath sounds: Normal breath sounds.  Musculoskeletal:        General: Normal range of motion.     Right lower leg: No edema.     Left lower leg: No edema.  Skin:    General: Skin is warm and dry.     Capillary Refill: Capillary refill takes less than 2 seconds.  Neurological:     Mental Status: She is alert and oriented to person, place, and time.  Psychiatric:        Mood and Affect: Mood normal.         Future Appointments  Date Time Provider [place]  08/30/2019 11:00 AM [facility] PA/NP [facility] None  09/17/2019  9:15 AM Elayne Snare, MD [facility] None  09/27/2019  1:30 PM Ladell Pier, MD [facility] None  12/20/2019  8:00 AM [facility] LAB 2 [facility] None  12/20/2019  8:30 AM Orson Slick, MD [facility] None    BP (!) 110/54 (BP Location: Left Arm, Patient Position: Sitting, Cuff Size: Normal)   Pulse 60   Resp 16   Wt (!) 322 lb (146.1 kg)   LMP 07/28/2013 Comment: perimenopausal  SpO2 99%   BMI 49.69 kg/m   Weight yesterday- 323 lb Last visit weight- 324 lb  Tamara Silva was seen at home today and rpeorted feeling well. She denied chest pain, SOB, headache, dizziness, orthopnea, fever or cough since our last visit. She has been seeing [facility] for the past several weeks and advised they have been coming almost daily. She stated she feels comfortable moving forward with them at this point so this was our  last visit. I will advise Tammy Sours, LCSW, to remove her from our patient list moving forward.   Tamara Silva, EMT 08/23/19  ACTION: Home visit completed

## 2019-08-27 ENCOUNTER — Other Ambulatory Visit: Payer: Self-pay | Admitting: Endocrinology

## 2019-08-28 ENCOUNTER — Other Ambulatory Visit: Payer: Self-pay

## 2019-08-30 ENCOUNTER — Encounter (HOSPITAL_COMMUNITY): Payer: Self-pay

## 2019-08-30 ENCOUNTER — Other Ambulatory Visit: Payer: Self-pay

## 2019-08-30 ENCOUNTER — Ambulatory Visit (HOSPITAL_COMMUNITY)
Admission: RE | Admit: 2019-08-30 | Discharge: 2019-08-30 | Disposition: A | Discharge status: Home / Self Care | Payer: Medicaid Other | Source: Ambulatory Visit | Attending: Cardiology | Admitting: Cardiology

## 2019-08-30 VITALS — BP 104/58 | HR 62 | Ht 67.5 in | Wt 325.4 lb

## 2019-08-30 DIAGNOSIS — Z87891 Personal history of nicotine dependence: Secondary | ICD-10-CM | POA: Diagnosis not present

## 2019-08-30 DIAGNOSIS — N183 Chronic kidney disease, stage 3 unspecified: Secondary | ICD-10-CM | POA: Diagnosis not present

## 2019-08-30 DIAGNOSIS — Z9981 Dependence on supplemental oxygen: Secondary | ICD-10-CM | POA: Insufficient documentation

## 2019-08-30 DIAGNOSIS — J449 Chronic obstructive pulmonary disease, unspecified: Secondary | ICD-10-CM | POA: Insufficient documentation

## 2019-08-30 DIAGNOSIS — Z95810 Presence of automatic (implantable) cardiac defibrillator: Secondary | ICD-10-CM | POA: Diagnosis not present

## 2019-08-30 DIAGNOSIS — E785 Hyperlipidemia, unspecified: Secondary | ICD-10-CM | POA: Diagnosis not present

## 2019-08-30 DIAGNOSIS — E1122 Type 2 diabetes mellitus with diabetic chronic kidney disease: Secondary | ICD-10-CM | POA: Diagnosis not present

## 2019-08-30 DIAGNOSIS — Z86711 Personal history of pulmonary embolism: Secondary | ICD-10-CM | POA: Insufficient documentation

## 2019-08-30 DIAGNOSIS — Z8249 Family history of ischemic heart disease and other diseases of the circulatory system: Secondary | ICD-10-CM | POA: Insufficient documentation

## 2019-08-30 DIAGNOSIS — I5022 Chronic systolic (congestive) heart failure: Secondary | ICD-10-CM | POA: Insufficient documentation

## 2019-08-30 DIAGNOSIS — Z955 Presence of coronary angioplasty implant and graft: Secondary | ICD-10-CM | POA: Diagnosis not present

## 2019-08-30 DIAGNOSIS — Z79899 Other long term (current) drug therapy: Secondary | ICD-10-CM | POA: Diagnosis not present

## 2019-08-30 DIAGNOSIS — Z8674 Personal history of sudden cardiac arrest: Secondary | ICD-10-CM | POA: Insufficient documentation

## 2019-08-30 DIAGNOSIS — G4733 Obstructive sleep apnea (adult) (pediatric): Secondary | ICD-10-CM | POA: Insufficient documentation

## 2019-08-30 DIAGNOSIS — I272 Pulmonary hypertension, unspecified: Secondary | ICD-10-CM | POA: Insufficient documentation

## 2019-08-30 DIAGNOSIS — Z794 Long term (current) use of insulin: Secondary | ICD-10-CM | POA: Insufficient documentation

## 2019-08-30 DIAGNOSIS — I2510 Atherosclerotic heart disease of native coronary artery without angina pectoris: Secondary | ICD-10-CM | POA: Insufficient documentation

## 2019-08-30 DIAGNOSIS — I130 Hypertensive heart and chronic kidney disease with heart failure and stage 1 through stage 4 chronic kidney disease, or unspecified chronic kidney disease: Secondary | ICD-10-CM | POA: Diagnosis not present

## 2019-08-30 DIAGNOSIS — Z8673 Personal history of transient ischemic attack (TIA), and cerebral infarction without residual deficits: Secondary | ICD-10-CM | POA: Insufficient documentation

## 2019-08-30 DIAGNOSIS — Z7982 Long term (current) use of aspirin: Secondary | ICD-10-CM | POA: Insufficient documentation

## 2019-08-30 DIAGNOSIS — I252 Old myocardial infarction: Secondary | ICD-10-CM | POA: Insufficient documentation

## 2019-08-30 LAB — BASIC METABOLIC PANEL
Anion gap: 10 (ref 5–15)
BUN: 43 mg/dL — ABNORMAL HIGH (ref 6–20)
CO2: 34 mmol/L — ABNORMAL HIGH (ref 22–32)
Calcium: 9.2 mg/dL (ref 8.9–10.3)
Chloride: 101 mmol/L (ref 98–111)
Creatinine, Ser: 1.4 mg/dL — ABNORMAL HIGH (ref 0.44–1.00)
GFR calc Af Amer: 48 mL/min — ABNORMAL LOW (ref >60)
GFR calc non Af Amer: 41 mL/min — ABNORMAL LOW (ref >60)
Glucose, Bld: 167 mg/dL — ABNORMAL HIGH (ref 70–99)
Potassium: 4.2 mmol/L (ref 3.5–5.1)
Sodium: 145 mmol/L (ref 135–145)

## 2019-08-30 NOTE — Progress Notes (Signed)
IDArdice Boyan, DOB Jul 31, 1960, MRN [id number]   Provider location:  Advanced Heart Failure Type of Visit: Established patient   PCP:  Ladell Pier, MD  HF Cardiology: Dr Aundra Dubin   History of Present Illness: Tamara Silva is a 59 y.o. 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  She has had frequent admits for anemia and CHF with transfusions and diuresis. Prasugrel was stopped. No source of bleeding found on EGD and colonoscopy.   She is on home 02 and nocturnal BiPAP for OHS/OSA and COPD.   She no longer smokes. Hx of PE in 2014.  Hx of bradycardia with Coreg at higher dose.  Echo 05/31/18 with EF 40-45%, PA peak pressure 71 mmHg.    Admitted 10/25/18 with SOB and edema, + productive cough.  This started over a few weeks prior to admission. Hospital course complicated by PEA arrest on 10/28/18. CPR for 25 minutes. Extubated 10/30/18.  Developed AKI.  HF team followed closely to optimize HF.  She was massively volume overloaded and extensively diuresed, weight down 65 lbs total.  Echo showed EF down to 30-35%.  No coronary angiography due to AKI.   Readmitted  11/17/18 with lower extremity edema and volume overload. Diuresed with IV lasix and transitioned to torsemide 40 mg twice daily. Discharged on 11/21/18 to home on oxygen.   She had RHC/LHC in 9/20, most significant stenosis was 75% ostial D1.  There was mild to moderate nonobstructive disease in other vessels. There was not an interventional target.  Filling pressures were mildly elevated with preserved cardiac output.  Torsemide was increased.   Echo in 10/20 showed that EF remained 30-35% with wall motion abnormalities, RV appeared normal.   Medtronic ICD placed in 11/20.   Had recent clinic f/u 2/21 and was volume overload on exam and based on Optivol and ReDs clip, 44%. Was symptomatic w/  exertional dyspnea and 3 pillow orthopnea. Torsemide was increased to 80 mg bid.  Had return visit on 3/16. Continued w/ dyspnea and endorsed cough. ReDs clip measurement higher at 50%. She was referred to remote health for home IV lasix and advised to increase PO torsemide (after IV Lasix) to 100 mg bid. Given cough, CXR was also ordered and c/w PNA. She was started on abx for CAP, treated w/ Augmentin 500 mg tid x 7 days.  She presents back for f/u. Feels much better. Remote health gave dose of IV lasix w/ symptomatic improvement. She is now on torsemide 100 mg bid. Remote health continues to follow closely. ReDs clip improved at 41% today. Cough resolved w/ abx. Denies fever and chills. BP 104/58.  ReDS clip: 41%  ECG (personally reviewed): Not performed   Labs (7/20): K 3.7, creatinine 1.56 Labs (9/20): K 3.7, creatinine 1.46, LDL 52, HDL 56  Labs (12/20): K 3.8, creatinine 1.51 Labs (1/21): hgb 10.6, K 4.4, creatinine 1.91 Labs (2/21) K 4.2, creatinine 1.72  Labs (3/21) K 4.5, creatinine 1.84   PMH: 1. CVA (1/20). 2. Type 2 diabetes 3. HTN 4. H/o seizures 5. PEA arrest (5/20) with CPR.  6. COPD: Home oxygen.  Stopped smoking in 1/15.  7. CAD: PCI [facility] 2006, [facility] 2009.  - Inferior STEMI 5/15, DES to RCA.  - Poor responder to Plavix.  - LHC (9/20): 75%  ostial D1, 40-50% mLCx, 40% proximal PLV.  8. Hyperlipidemia 9. PE: 2014, LLL.  10. OHS/OSA: Home oxygen and Bipap used.  11. Chronic systolic CHF: Suspect primarily ischemic cardiomyopathy. Medtronic ICD.  - Echo (5/15): EF 35-40% - Echo (2/16): EF 40-45% - Echo (1/20): EF 40-45% - Echo (5/20): EF 30-35%, mild RV dilation.  - RHC (5/20): mean RA 9, PA 62/22 mean 37, mean PCWP 15, CI 4.29, PVR 2.1 WU.  - RHC (9/20): mean RA 11, PA 66/18 mean 37, mean PCWP 19, CI 3.12, PVR 2.4 WU - Echo (10/20): EF 30-35% with wall motion abnormalities, normal RV.  12. CKD: Stage 3.  13. Fe deficiency anemia: EGD and colonoscopy  without definite source of bleeding.   ROS: All systems negative except as listed in HPI, PMH and Problem List.  SH:  Social History   Socioeconomic History  . Marital status: Single    Spouse name: Not on file  . Number of children: 1  . Years of education: Not on file  . Highest education level: Not on file  Occupational History  . Not on file  Tobacco Use  . Smoking status: Former Smoker    Packs/day: 0.50    Years: 20.00    Pack years: 10.00    Types: Cigarettes    Quit date: 05/31/2018    Years since quitting: 1.2  . Smokeless tobacco: Never Used  . Tobacco comment: smoked off and on x 20 years  Substance and Sexual Activity  . Alcohol use: No    Alcohol/week: 0.0 standard drinks  . Drug use: Not Currently    Types: Cocaine  . Sexual activity: Not Currently  Other Topics Concern  . Not on file  Social History Narrative   Origibnally from [place]   Most recently from [place]   Daughter lives in town      On Social security to CHF, COPD, CAD   Social Determinants of Health   Financial Resource Strain:   . Difficulty of Paying Living Expenses:   Food Insecurity:   . Worried About Charity fundraiser in the Last Year:   . Arboriculturist in the Last Year:   Transportation Needs:   . Film/video editor (Medical):   [name] Kitchen Lack of Transportation (Non-Medical):   Physical Activity:   . Days of Exercise per Week:   . Minutes of Exercise per Session:   Stress:   . Feeling of Stress :   Social Connections:   . Frequency of Communication with Friends and Family:   . Frequency of Social Gatherings with Friends and Family:   . Attends Religious Services:   . Active Member of Clubs or Organizations:   . Attends Archivist Meetings:   [name] Kitchen Marital Status:   Intimate Partner Violence:   . Fear of Current or Ex-Partner:   . Emotionally Abused:   [name] Kitchen Physically Abused:   . Sexually Abused:     FH:  Family History  Problem Relation Age of Onset  . Stroke  Mother   . Hypertension Mother   . Colon cancer Father   . Diabetes Father   . Heart attack Father   . Sarcoidosis Other   . Breast cancer Paternal Aunt   . Emphysema Brother   . Lung disease Brother        Unknown type, 3 brothers, one with liver and lung disease  . Diabetes Brother   . Asthma Paternal Aunt   . Diabetes  Sister     Current Outpatient Medications  Medication Sig Dispense Refill  . ACCU-CHEK AVIVA PLUS test strip USE 1 STRIP TO CHECK BLOOD GLUCOSE 3 TIMES A DAY 100 each 2  . acetaminophen (TYLENOL) 500 MG tablet Take 500 mg by mouth every 6 (six) hours as needed for moderate pain or headache.    . albuterol (PROAIR HFA) 108 (90 Base) MCG/ACT inhaler INHALE 2 PUFFS BY MOUTH 4 TIMES DAILY AS NEEDED 18 g 11  . aspirin EC 81 MG tablet Take 1 tablet (81 mg total) by mouth daily. 90 tablet 3  . bisoprolol (ZEBETA) 5 MG tablet Take 1 tablet (5 mg total) by mouth daily. 30 tablet 6  . EASY COMFORT PEN NEEDLES 31G X 5 MM MISC USE 3 TIMES A DAY FOR INSULIN ADMINISTRATION 100 each 12  . ferrous sulfate 325 (65 FE) MG tablet Take 1 tablet (325 mg total) by mouth daily after supper. Please take with a source of vitamin C 90 tablet 3  . fluticasone (FLONASE) 50 MCG/ACT nasal spray Place 1 spray into both nostrils daily. 16 g 1  . gabapentin (NEURONTIN) 300 MG capsule TAKE 1 CAPSULE (300 MG TOTAL) BY MOUTH 3 (THREE) TIMES DAILY. START WITH 1 CAPSULE AT BEDTIME FOR 7 DAYS THEN INCREASE TO 3 TIMES A DAY IF TOLERATED 90 capsule 3  . hydrocerin (EUCERIN) CREA Apply 1 application topically 2 (two) times daily.  0  . insulin degludec (TRESIBA FLEXTOUCH) 200 UNIT/ML FlexTouch Pen Inject 64 Units into the skin daily. 15 mL 2  . ipratropium-albuterol (DUONEB) 0.5-2.5 (3) MG/3ML SOLN Use nebulizer to inhale contents of one vial every 6 hours as needed for shortness of breath, wheezing 360 mL 11  . isosorbide-hydrALAZINE (BIDIL) 20-37.5 MG tablet Take 1 tablet by mouth 3 (three) times daily. 90  tablet 6  . liraglutide (VICTOZA) 18 MG/3ML SOPN INJECT 1.8 MG INTO THE SKIN DAILY. 9 mL 2  . NEEDLE, DISP, 26 G 26G X 1/2" MISC 1 application by Does not apply route daily. 100 each 0  . NOVOLOG FLEXPEN 100 UNIT/ML FlexPen Inject 18 units under the skin three times daily before meals. May also take 4-6 units before bedtime with snacks. 30 mL 4  . omeprazole (PRILOSEC) 40 MG capsule TAKE 1 CAPSULE (40 MG TOTAL) BY MOUTH DAILY. 30 capsule 2  . PAZEO 0.7 % SOLN Place 1 drop into both eyes daily.     . potassium chloride SA (KLOR-CON) 20 MEQ tablet Take 2 tablets (40 mEq total) by mouth daily. 60 tablet 3  . RESTASIS 0.05 % ophthalmic emulsion Place 1 drop into both eyes daily as needed.     . rosuvastatin (CRESTOR) 40 MG tablet TAKE 1 TABLET (40 MG TOTAL) BY MOUTH DAILY AT 6 PM. 30 tablet 3  . sacubitril-valsartan (ENTRESTO) 24-26 MG Take 1 tablet by mouth 2 (two) times daily. 60 tablet 5  . torsemide (DEMADEX) 100 MG tablet Take 1 tablet (100 mg total) by mouth 2 (two) times daily. 60 tablet 4  . umeclidinium-vilanterol (ANORO ELLIPTA) 62.5-25 MCG/INH AEPB Inhale 1 puff into the lungs daily. 21 each 0  . vitamin B-12 (CYANOCOBALAMIN) 1000 MCG tablet Take 1,000 mcg by mouth daily.    . vitamin C (ASCORBIC ACID) 250 MG tablet Take 2 tablets (500 mg total) by mouth daily. 90 tablet 3  . spironolactone (ALDACTONE) 25 MG tablet TAKE 1 TABLET (25 MG TOTAL) BY MOUTH DAILY. 90 tablet 1   No current facility-administered medications  for this encounter.    Vitals:   08/30/19 1100  BP: (!) 104/58  Pulse: 62  SpO2: 100%  Weight: (!) 147.6 kg (325 lb 6.4 oz)  Height: 5' 7.5" (1.715 m)   Wt Readings from Last 3 Encounters:  08/30/19 (!) 147.6 kg (325 lb 6.4 oz)  08/23/19 (!) 146.1 kg (322 lb)  08/14/19 (!) 147 kg (324 lb)   PHYSICAL EXAM: ReDs Clip 41% General:  Well appearing, morbidly obese AAF. No respiratory difficulty HEENT: normal Neck: supple. no JVD. Carotids 2+ bilat; no bruits. No  lymphadenopathy or thyromegaly appreciated. Cor: PMI nondisplaced. Regular rate & rhythm. No rubs, gallops or murmurs. Lungs: clear Abdomen: obese, soft, nontender, nondistended. No hepatosplenomegaly. No bruits or masses. Good bowel sounds. Extremities: no cyanosis, clubbing, rash, edema Neuro: alert & oriented x 3, cranial nerves grossly intact. moves all 4 extremities w/o difficulty. Affect pleasant.    ASSESSMENT & PLAN: 1. H/O PEA arrest: Due to severe hypoxemia in 5/20 in setting of CHF exacerbation.   2. Chronic systolic CHF: Suspect ischemic cardiomyopathy. Medtronic ICD.  Echo in 1/20 with EF 40-45%, RV moderately dilated, PASP 71 mmHg. Echo in 5/20 with lower EF, 30-35%, and mildly dilated RV. Suspect significant RV failure, may be related to OHS/OSA and COPD. She was massively volume overloaded during 5/20 admission and diuresed extensively.  RHC done 11/06/18 showed high cardiac output and minimally elevated filling pressures after diuresis.  PVR was not elevated, appeared to be high output PH.  RHC was done again in 9/20, filling pressures were elevated with preserved cardiac output.  Echo in 10/20 showed EF still 30-35%.   - volume status improving after diuretic dose adjustments. Recently required Remote home health for IV Lasix. Now on higher dose of torsemide 100 mg bid. Respiratory status improved, ReDs Vest reading improved but still elevated, 50>>41% today.  - Continue torsemide 100 mg bid. Will ask remote health to give another dose of IV Lasix tomorrow. May eventually need PRN metolazone if ongoing issues w/ fluid retention.  - Her BP will not tolerate up titration of Entresto. Continue 24-26 mg bid - Continue spironolactone 25 mg daily.  - Given significant lung disease/COPD, she is on bisoprolol 5 mg daily (more beta-1 selective).  - GFR too low for SGLT2i - Check BMP today - Discussed low sodium diet and wt loss - Appreciate remote health/ paramedicine's assistance  3.  CKD: Stage III. Last creatinine 1.8   - repeat BMP today  4. COPD: On home oxygen at baseline. Has quit smoking.  5. OHS/OSA: On home oxygen at all times. Admit that compliance w/ BiPAP is poor.  - Encouraged to improve compliance.  6. CAD: History of PCI, most recently had inferior STEMI in 5/15 with DES to RCA. With fall in EF this year, coronary angiography was done in 9/20.  There was 75% ostial D1 stenosis but no good target for PCI. No chest pain.  - Continue ASA 81 and Crestor 40 daily.  Good lipids in 9/20.    7. Remote PE: She has not been anticoagulated due to h/o GI bleeding.  8. Anemia: History of GI bleeding, had endoscopies earlier this year with no bleeding source found. Also with history of B12 deficiency.  9. Pulmonary hypertension: Pulmonary venous hypertension on last RHC in 9/20.  - continue diuretics per above   Remote home health to continue to follow closely x 2 more weeks. F/u in [facility] in 4-6 weeks, sooner if needed.  Signed, Lyda Jester, PA-C  08/30/2019  [place] [address] [facility] [place] [address] [phone] (office) [phone] (fax)

## 2019-08-30 NOTE — Patient Instructions (Addendum)
Labs today We will only contact you if something comes back abnormal or we need to make some changes. Otherwise no news is good news!  Follow up in 6 weeks

## 2019-08-30 NOTE — Progress Notes (Signed)
ReDS Vest / Clip - 08/30/19 1100      ReDS Vest / Clip   Station Marker  D    Ruler Value  38    ReDS Value Range  (!) High volume overload    ReDS Actual Value  41    Anatomical Comments  sitting

## 2019-08-31 ENCOUNTER — Telehealth (HOSPITAL_COMMUNITY): Payer: Self-pay | Admitting: Licensed Clinical Social Worker

## 2019-08-31 NOTE — Telephone Encounter (Signed)
CSW informed by [employer] that pt is interested in getting her GED and is wondering about where she might be able to do this.  CSW called pt and provided with number for [facility] Adult Education program where they offer free GED classes- pt planning to call and inquire about enrollment  CSW will continue to follow and assist as needed  Jorge Ny, [facility] Desk#: [phone] Cell#: [phone]

## 2019-09-01 ENCOUNTER — Other Ambulatory Visit
Admission: RE | Admit: 2019-09-01 | Discharge: 2019-09-01 | Disposition: A | Discharge status: Home / Self Care | Payer: Medicaid Other | Source: Other Acute Inpatient Hospital | Attending: Nurse Practitioner | Admitting: Nurse Practitioner

## 2019-09-01 DIAGNOSIS — I5023 Acute on chronic systolic (congestive) heart failure: Secondary | ICD-10-CM | POA: Insufficient documentation

## 2019-09-01 LAB — BASIC METABOLIC PANEL
Anion gap: 9 (ref 5–15)
BUN: 49 mg/dL — ABNORMAL HIGH (ref 6–20)
CO2: 32 mmol/L (ref 22–32)
Calcium: 9 mg/dL (ref 8.9–10.3)
Chloride: 100 mmol/L (ref 98–111)
Creatinine, Ser: 1.59 mg/dL — ABNORMAL HIGH (ref 0.44–1.00)
GFR calc Af Amer: 41 mL/min — ABNORMAL LOW (ref >60)
GFR calc non Af Amer: 35 mL/min — ABNORMAL LOW (ref >60)
Glucose, Bld: 175 mg/dL — ABNORMAL HIGH (ref 70–99)
Potassium: 4.9 mmol/L (ref 3.5–5.1)
Sodium: 141 mmol/L (ref 135–145)

## 2019-09-17 ENCOUNTER — Telehealth (INDEPENDENT_AMBULATORY_CARE_PROVIDER_SITE_OTHER): Payer: Medicaid Other | Admitting: Endocrinology

## 2019-09-17 ENCOUNTER — Other Ambulatory Visit: Payer: Self-pay

## 2019-09-17 ENCOUNTER — Encounter: Payer: Self-pay | Admitting: Endocrinology

## 2019-09-17 DIAGNOSIS — E1165 Type 2 diabetes mellitus with hyperglycemia: Secondary | ICD-10-CM

## 2019-09-17 DIAGNOSIS — R7989 Other specified abnormal findings of blood chemistry: Secondary | ICD-10-CM | POA: Diagnosis not present

## 2019-09-17 DIAGNOSIS — Z794 Long term (current) use of insulin: Secondary | ICD-10-CM

## 2019-09-17 MED ORDER — ACCU-CHEK AVIVA CONNECT W/DEVICE KIT: 1.0000 | PACK | Freq: Three times a day (TID) | 0 refills | Status: DC

## 2019-09-17 NOTE — Progress Notes (Signed)
Patient ID: Tamara Silva, female   DOB: 27-Jul-1960, 59 y.o.   MRN: [id number]          Reason for Appointment: Follow-up visit for Type 2 Diabetes  Referring PCP: Dr. Karle Plumber  I connected with the above-named patient by video enabled telemedicine application and verified that I am speaking with the correct person. The patient was explained the limitations of evaluation and management by telemedicine and the availability of in person appointments.  Patient also understood that there may be a patient responsible charge related to this service . Location of the patient: Patient's home . Location of the provider: Physician office Only the patient and myself were participating in the encounter The patient understood the above statements and agreed to proceed.  History of Present Illness:          Date of diagnosis of type 2 diabetes mellitus: 2000       Background history:  She thinks her blood sugar was over 600 at the time of diagnosis and was started on insulin from the start Also she took Metformin for couple of years but she thinks it was stopped because it did not work Her diabetes history is only available since about 2016 in her chart She has been mostly on Levemir insulin Appears to have had sporadic follow-up with only 4 or 5 A1c results available in the last 4 years  Recent history:   Most recent A1c is 8.8 done on 01/19/2019  fructosamine was last 261 done in 12/20  INSULIN regimen is: [place] 64 units daily, NovoLog 18 units before meals       Non-insulin hypoglycemic drugs the patient is taking are: Victoza 1.8 mg daily  Current management, blood sugar patterns and problems identified:  She has not come into the office for her labs or A1c  Although she has been told to check her sugars after meals consistently she is mostly checking them on waking up and midday  She said that her appetite is variable especially in the mornings and may eat only 2 meals a day  sometimes  However frequently at lunchtime she will have cereal and banana, with this likely her blood sugar was 288 recently  She does not have a meal plan has only been trying to follow a low-sodium diet  She has however cut back on snacks and sweets later at night  However cannot explain why her blood sugars in the morning have been as high as 301  However lowest blood sugar was 137 at 2 AM  She usually tries to take her NOVOLOG before meals although in the morning sometimes may not read right after taking the injection until she gets hungry  She has taken her Victoza regularly  No hypoglycemia recently         Side effects from medications have been: None     Meal times are: Dinner: 6-7 PM             Exercise:  Only some walking around the house  Glucose monitoring:  done about 2 times a day         Glucometer:  Accu-Chek.       Blood Glucose readings by time of day and averages from meter review:   PRE-MEAL Fasting Lunch Dinner Bedtime Overall  Glucose range:  161-301  129-217  154  179   Mean/median:     ?   POST-MEAL PC Breakfast PC Lunch PC Dinner  Glucose range:   203-288  Mean/median:       Previous readings:  PRE-MEAL Fasting Lunch Dinner Bedtime Overall  Glucose range:  122-261  123-207  104-190    Mean/median:     ?   POST-MEAL PC Breakfast PC Lunch PC Dinner  Glucose range:  154   126, 209  Mean/median:        Dietician visit, most recent: During last hospitalization  Weight history:  Wt Readings from Last 3 Encounters:  08/30/19 (!) 325 lb 6.4 oz (147.6 kg)  08/23/19 (!) 322 lb (146.1 kg)  08/14/19 (!) 324 lb (147 kg)    Glycemic control:   Lab Results  Component Value Date   HGBA1C 8.8 (A) 01/19/2019   HGBA1C 7.1 (H) 06/01/2018   HGBA1C 8.0 (A) 01/23/2018   HGBA1C 8 01/23/2018   HGBA1C 8 (A) 01/23/2018   HGBA1C 8.0 (A) 01/23/2018   Lab Results  Component Value Date   MICROALBUR 9.4 (H) 05/08/2019   LDLCALC 52 02/07/2019    CREATININE 1.59 (H) 09/01/2019   Lab Results  Component Value Date   MICRALBCREAT 7.1 05/08/2019    Lab Results  Component Value Date   FRUCTOSAMINE 261 05/08/2019    No visits with results within 1 Week(s) from this visit.  Latest known visit with results is:  Hospital Outpatient Visit on 09/01/2019  Component Date Value Ref Range Status  . Sodium 09/01/2019 141  135 - 145 mmol/L Final  . Potassium 09/01/2019 4.9  3.5 - 5.1 mmol/L Final  . Chloride 09/01/2019 100  98 - 111 mmol/L Final  . CO2 09/01/2019 32  22 - 32 mmol/L Final  . Glucose, Bld 09/01/2019 175* 70 - 99 mg/dL Final   Glucose reference range applies only to samples taken after fasting for at least 8 hours.  . BUN 09/01/2019 49* 6 - 20 mg/dL Final  . Creatinine, Ser 09/01/2019 1.59* 0.44 - 1.00 mg/dL Final  . Calcium 09/01/2019 9.0  8.9 - 10.3 mg/dL Final  . GFR calc non Af Amer 09/01/2019 35* >60 mL/min Final  . GFR calc Af Amer 09/01/2019 41* >60 mL/min Final  . Anion gap 09/01/2019 9  5 - 15 Final   Performed at [facility], [address] [place], [place] [address]    Allergies as of 09/17/2019      Reactions   Metolazone Other (See Comments)   Dizziness and falling   Plavix [clopidogrel Bisulfate] Other (See Comments)   High PRU's - non-responder   Ibuprofen Other (See Comments)   Pt states she is not supposed to take ibuprofen because of other meds she is taking   Nsaids Other (See Comments)   "I do not take NSAIDs d/t interfering with other meds"      Medication List       Accurate as of September 17, 2019  9:20 AM. If you have any questions, ask your nurse or doctor.        Accu-Chek Aviva Plus test strip Generic drug: glucose blood USE 1 STRIP TO CHECK BLOOD GLUCOSE 3 TIMES A DAY   acetaminophen 500 MG tablet Commonly known as: TYLENOL Take 500 mg by mouth every 6 (six) hours as needed for moderate pain or headache.   albuterol 108 (90 Base) MCG/ACT inhaler Commonly known as:  ProAir HFA INHALE 2 PUFFS BY MOUTH 4 TIMES DAILY AS NEEDED   Anoro Ellipta 62.5-25 MCG/INH Aepb Generic drug: umeclidinium-vilanterol Inhale 1 puff into the lungs daily.   aspirin EC 81 MG tablet  Take 1 tablet (81 mg total) by mouth daily.   BiDil 20-37.5 MG tablet Generic drug: isosorbide-hydrALAZINE Take 1 tablet by mouth 3 (three) times daily.   bisoprolol 5 MG tablet Commonly known as: ZEBETA Take 1 tablet (5 mg total) by mouth daily.   Easy Comfort Pen Needles 31G X 5 MM Misc Generic drug: Insulin Pen Needle USE 3 TIMES A DAY FOR INSULIN ADMINISTRATION   ferrous sulfate 325 (65 FE) MG tablet Take 1 tablet (325 mg total) by mouth daily after supper. Please take with a source of vitamin C   fluticasone 50 MCG/ACT nasal spray Commonly known as: FLONASE Place 1 spray into both nostrils daily.   gabapentin 300 MG capsule Commonly known as: NEURONTIN TAKE 1 CAPSULE (300 MG TOTAL) BY MOUTH 3 (THREE) TIMES DAILY. START WITH 1 CAPSULE AT BEDTIME FOR 7 DAYS THEN INCREASE TO 3 TIMES A DAY IF TOLERATED   hydrocerin Crea Apply 1 application topically 2 (two) times daily.   ipratropium-albuterol 0.5-2.5 (3) MG/3ML Soln Commonly known as: DUONEB Use nebulizer to inhale contents of one vial every 6 hours as needed for shortness of breath, wheezing   NEEDLE (DISP) 26 G 26G X 1/2" Misc 1 application by Does not apply route daily.   NovoLOG FlexPen 100 UNIT/ML FlexPen Generic drug: insulin aspart Inject 18 units under the skin three times daily before meals. May also take 4-6 units before bedtime with snacks. What changed: additional instructions   omeprazole 40 MG capsule Commonly known as: PRILOSEC TAKE 1 CAPSULE (40 MG TOTAL) BY MOUTH DAILY.   [place] 0.7 % Soln Generic drug: Olopatadine HCl Place 1 drop into both eyes daily.   potassium chloride SA 20 MEQ tablet Commonly known as: KLOR-CON Take 2 tablets (40 mEq total) by mouth daily.   Restasis 0.05 % ophthalmic  emulsion Generic drug: cycloSPORINE Place 1 drop into both eyes daily as needed.   rosuvastatin 40 MG tablet Commonly known as: CRESTOR TAKE 1 TABLET (40 MG TOTAL) BY MOUTH DAILY AT 6 PM.   sacubitril-valsartan 24-26 MG Commonly known as: ENTRESTO Take 1 tablet by mouth 2 (two) times daily.   spironolactone 25 MG tablet Commonly known as: ALDACTONE TAKE 1 TABLET (25 MG TOTAL) BY MOUTH DAILY.   torsemide 100 MG tablet Commonly known as: DEMADEX Take 1 tablet (100 mg total) by mouth 2 (two) times daily.   Tyler Aas FlexTouch 200 UNIT/ML FlexTouch Pen Generic drug: insulin degludec Inject 64 Units into the skin daily.   Victoza 18 MG/3ML Sopn Generic drug: liraglutide INJECT 1.8 MG INTO THE SKIN DAILY.   vitamin B-12 1000 MCG tablet Commonly known as: CYANOCOBALAMIN Take 1,000 mcg by mouth daily.   vitamin C 250 MG tablet Commonly known as: ASCORBIC ACID Take 2 tablets (500 mg total) by mouth daily.       Allergies:  Allergies  Allergen Reactions  . Metolazone Other (See Comments)    Dizziness and falling  . Plavix [Clopidogrel Bisulfate] Other (See Comments)    High PRU's - non-responder  . Ibuprofen Other (See Comments)    Pt states she is not supposed to take ibuprofen because of other meds she is taking  . Nsaids Other (See Comments)    "I do not take NSAIDs d/t interfering with other meds"    Past Medical History:  Diagnosis Date  . AICD (automatic cardioverter/defibrillator) present 04/04/2019  . Asthma   . CAD (coronary artery disease)   . Chronic combined systolic (congestive) and diastolic (congestive)  heart failure ([place])   . Chronic iron deficiency anemia   . CVA (cerebral vascular accident) ([place]) 05/2018  . Diabetes mellitus without complication ([place])   . DOE (dyspnea on exertion)   . Edema of both legs   . Hepatic steatosis   . Hypertension   . Pulmonary emboli ([place]) 2014  . Respiratory failure ([place]) 09/2018  . Seizure ([place]) 05/2018    Past  Surgical History:  Procedure Laterality Date  . BIOPSY  07/10/2018   Procedure: BIOPSY;  Surgeon: [name], MD;  Location: [place];  Service: Gastroenterology;;  . COLONOSCOPY WITH PROPOFOL N/A 08/03/2013   Procedure: COLONOSCOPY WITH PROPOFOL;  Surgeon: Jeryl Columbia, MD;  Location: [facility];  Service: Endoscopy;  Laterality: N/A;  . COLONOSCOPY WITH PROPOFOL N/A 07/10/2018   Procedure: COLONOSCOPY WITH PROPOFOL;  Surgeon: [name], MD;  Location: [place];  Service: Gastroenterology;  Laterality: N/A;  . CORONARY ANGIOPLASTY WITH STENT PLACEMENT     CAD in 2006 x 2 and 2009 2 more- place din REx in [place] [facility] [place] [facility]  . CORONARY ANGIOPLASTY WITH STENT PLACEMENT  10/07/2013   Xience [place] DES 2.75  mm x 15  mm  . ESOPHAGOGASTRODUODENOSCOPY (EGD) WITH PROPOFOL N/A 08/03/2013   Procedure: ESOPHAGOGASTRODUODENOSCOPY (EGD) WITH PROPOFOL;  Surgeon: Jeryl Columbia, MD;  Location: [facility];  Service: Endoscopy;  Laterality: N/A;  . ESOPHAGOGASTRODUODENOSCOPY (EGD) WITH PROPOFOL N/A 07/10/2018   Procedure: ESOPHAGOGASTRODUODENOSCOPY (EGD) WITH PROPOFOL;  Surgeon: [name], MD;  Location: [place];  Service: Gastroenterology;  Laterality: N/A;  . ICD IMPLANT  04/04/2019  . ICD IMPLANT N/A 04/04/2019   Procedure: ICD IMPLANT;  Surgeon: Constance Haw, MD;  Location: [place] [facility];  Service: Cardiovascular;  Laterality: N/A;  . LEFT HEART CATHETERIZATION WITH CORONARY ANGIOGRAM N/A 10/07/2013   Procedure: LEFT HEART CATHETERIZATION WITH CORONARY ANGIOGRAM;  Surgeon: Leonie Man, MD;  Location: [facility];  Service: Cardiovascular;  Laterality: N/A;  . RIGHT HEART CATH N/A 11/06/2018   Procedure: RIGHT HEART CATH;  Surgeon: Larey Dresser, MD;  Location: [place] [facility];  Service: Cardiovascular;  Laterality: N/A;  . RIGHT/LEFT HEART CATH AND CORONARY ANGIOGRAPHY N/A 02/12/2019   Procedure: RIGHT/LEFT HEART CATH AND CORONARY ANGIOGRAPHY;  Surgeon: Larey Dresser,  MD;  Location: [place] [facility];  Service: Cardiovascular;  Laterality: N/A;    Family History  Problem Relation Age of Onset  . Stroke Mother   . Hypertension Mother   . Colon cancer Father   . Diabetes Father   . Heart attack Father   . Sarcoidosis Other   . Breast cancer Paternal Aunt   . Emphysema Brother   . Lung disease Brother        Unknown type, 3 brothers, one with liver and lung disease  . Diabetes Brother   . Asthma Paternal Aunt   . Diabetes Sister     Social History:  reports that she quit smoking about 15 months ago. Her smoking use included cigarettes. She has a 10.00 pack-year smoking history. She has never used smokeless tobacco. She reports previous drug use. Drug: Cocaine. She reports that she does not drink alcohol.   Review of Systems  TSH was abnormal in 5/20 but subsequently normal   Lab Results  Component Value Date   TSH 3.11 05/08/2019   TSH 5.177 (H) 10/28/2018   TSH 1.816 05/30/2018   FREET4 0.83 05/08/2019     Lipid history: Has been treated with  Crestor 40 mg daily, has history of CAD    Lab Results  Component Value Date   CHOL 137 02/07/2019   HDL 56 02/07/2019   LDLCALC 52 02/07/2019   TRIG 145 02/07/2019   CHOLHDL 2.4 02/07/2019           Hypertension: Treated with bisoprolol, also CHF has been present CHF treated with spironolactone and torsemide as well as isosorbide/hydralazine  BP Readings from Last 3 Encounters:  08/30/19 (!) 104/58  08/23/19 (!) 110/54  08/14/19 122/66   She has chronic renal insufficiency not related to diabetes   Lab Results  Component Value Date   CREATININE 1.59 (H) 09/01/2019   CREATININE 1.40 (H) 08/30/2019   CREATININE 1.84 (H) 08/14/2019    Most recent eye exam was in 10/2018 with [facility], unknown status of retinopathy  Most recent foot exam: 8/20  Currently known complications of diabetes: Neuropathy, microalbuminuria  Other active problems are history of coronary artery  disease, cardiomyopathy and COPD She is using oxygen  LABS:  No visits with results within 1 Week(s) from this visit.  Latest known visit with results is:  Hospital Outpatient Visit on 09/01/2019  Component Date Value Ref Range Status  . Sodium 09/01/2019 141  135 - 145 mmol/L Final  . Potassium 09/01/2019 4.9  3.5 - 5.1 mmol/L Final  . Chloride 09/01/2019 100  98 - 111 mmol/L Final  . CO2 09/01/2019 32  22 - 32 mmol/L Final  . Glucose, Bld 09/01/2019 175* 70 - 99 mg/dL Final   Glucose reference range applies only to samples taken after fasting for at least 8 hours.  . BUN 09/01/2019 49* 6 - 20 mg/dL Final  . Creatinine, Ser 09/01/2019 1.59* 0.44 - 1.00 mg/dL Final  . Calcium 09/01/2019 9.0  8.9 - 10.3 mg/dL Final  . GFR calc non Af Amer 09/01/2019 35* >60 mL/min Final  . GFR calc Af Amer 09/01/2019 41* >60 mL/min Final  . Anion gap 09/01/2019 9  5 - 15 Final   Performed at [facility], [name] [place], [place] [address]    Physical Examination:  LMP 07/28/2013 Comment: perimenopausal  Exam not done, virtual visit   ASSESSMENT:  Diabetes type 2  See history of present illness for detailed discussion of current diabetes management, blood sugar patterns and problems identified  Last A1c was 8.8 and needs updated  Also fructosamine has not been done recently  Current treatment regimen is basal bolus insulin regimen using Tresiba and NovoLog and Victoza  Unable to assess patient's control adequately because of lack of A1c or fructosamine labs since 12/20  She only checks blood sugars in the mornings and midday and overall these are averaging about 180-200  Currently taking fixed doses of NovoLog 18 units but does appear to have at least high readings after lunch when she may sometimes eat cornflakes  Morning sugars are quite variable but she has had readings as low as 137 overnight Although she thinks she is watching her diet with low sodium and fat  content needs a formal meal plan and balanced meals Also she thinks her meter is not working right and is relatively old  History of abnormal TSH in 09/2018, will need to repeat    PLAN:    1. Glucose monitoring: New Accu-Chek meter will be sent . Patient advised to her more postprandial readings sugars 2 hours after meals on a regular basis especially after dinner.  Discussed blood sugar targets at various times  2.  Diabetes education: . Patient will need consultation with dietitian and this will be ordered again  3.  Lifestyle changes: . Dietary changes: Avoid eating fruit and cereals except on occasion . She needs to add a protein from low-fat dairy, eggs or low-fat meats every meal . Continue to cut back on late evening snacks especially high carbohydrate or high fat . Exercise regimen: Needs to be instructed on chair exercises with diabetes educator when she is able to come in  4.  Medication changes needed: . Increase Tresiba to 66 units . NovoLog 20 units with every meal and may consider increasing the dose as needed . Stay on Victoza 1.8 mg . She will need to make sure she is ready to eat when she takes her NovoLog and not longer  5.  Preventive care needed:  . Urine microalbumin, complete eye exam  6.  Follow-up: 4 weeks with A1c in the office  7.  Recheck TSH on next visit   There are no Patient Instructions on file for this visit.       Elayne Snare 09/17/2019, 9:20 AM   Note: This office note was prepared with Dragon voice recognition system technology. Any transcriptional errors that result from this process are unintentional.

## 2019-09-26 ENCOUNTER — Other Ambulatory Visit: Payer: Self-pay | Admitting: Internal Medicine

## 2019-09-26 DIAGNOSIS — J302 Other seasonal allergic rhinitis: Secondary | ICD-10-CM

## 2019-09-27 ENCOUNTER — Ambulatory Visit: Payer: Medicaid Other | Admitting: Internal Medicine

## 2019-10-01 ENCOUNTER — Telehealth: Payer: Self-pay

## 2019-10-01 NOTE — Telephone Encounter (Signed)
PRIOR AUTH FOR VICTOZA APPROVED THRU MEDICAID THRU 09/30/20

## 2019-10-09 ENCOUNTER — Telehealth: Payer: Self-pay | Admitting: Endocrinology

## 2019-10-09 ENCOUNTER — Other Ambulatory Visit: Payer: Self-pay

## 2019-10-09 MED ORDER — EASY COMFORT PEN NEEDLES 31G X 5 MM MISC: 12 refills | Status: DC

## 2019-10-09 MED ORDER — ACCU-CHEK GUIDE VI STRP: ORAL_STRIP | 2 refills | Status: DC

## 2019-10-09 MED ORDER — ACCU-CHEK GUIDE W/DEVICE KIT: PACK | 0 refills | Status: AC

## 2019-10-09 NOTE — Telephone Encounter (Signed)
Medication Refill Request  . Did you call your pharmacy and request this refill first? [facility] called [place]  . If patient has not contacted pharmacy first, instruct them to do so for future refills.  . Remind them that contacting the pharmacy for their refill is the quickest method to get the refill.  . Refill policy also stated that it will take anywhere between 24-72 hours to receive the refill.    Name of medication? Accu Check Guide Meter    Accu Check Guide test strips    Soft Touch test strips   Is this a 90 day supply? no  Name and location of pharmacy? [facility] on [address]  . Is the request for diabetes test strips? Yes       If yes, what brand? Accu Check Guide Meter    Accu Check Guide test strips    Soft Touch test strips

## 2019-10-09 NOTE — Telephone Encounter (Signed)
Rx sent 

## 2019-10-11 ENCOUNTER — Encounter (HOSPITAL_COMMUNITY): Payer: Medicaid Other

## 2019-10-13 ENCOUNTER — Other Ambulatory Visit (HOSPITAL_COMMUNITY): Payer: Self-pay | Admitting: Cardiology

## 2019-10-17 ENCOUNTER — Ambulatory Visit: Payer: Medicaid Other | Admitting: Endocrinology

## 2019-10-19 ENCOUNTER — Other Ambulatory Visit: Payer: Self-pay

## 2019-10-22 ENCOUNTER — Other Ambulatory Visit: Payer: Self-pay

## 2019-10-22 ENCOUNTER — Encounter: Payer: Self-pay | Admitting: Endocrinology

## 2019-10-22 ENCOUNTER — Ambulatory Visit (INDEPENDENT_AMBULATORY_CARE_PROVIDER_SITE_OTHER): Payer: Medicaid Other | Admitting: Endocrinology

## 2019-10-22 VITALS — BP 110/50 | HR 65 | Ht 67.5 in | Wt 339.0 lb

## 2019-10-22 DIAGNOSIS — E1142 Type 2 diabetes mellitus with diabetic polyneuropathy: Secondary | ICD-10-CM | POA: Diagnosis not present

## 2019-10-22 DIAGNOSIS — R7989 Other specified abnormal findings of blood chemistry: Secondary | ICD-10-CM | POA: Diagnosis not present

## 2019-10-22 DIAGNOSIS — IMO0002 Reserved for concepts with insufficient information to code with codable children: Secondary | ICD-10-CM

## 2019-10-22 DIAGNOSIS — Z794 Long term (current) use of insulin: Secondary | ICD-10-CM | POA: Diagnosis not present

## 2019-10-22 DIAGNOSIS — E1165 Type 2 diabetes mellitus with hyperglycemia: Secondary | ICD-10-CM

## 2019-10-22 LAB — POCT GLYCOSYLATED HEMOGLOBIN (HGB A1C): Hemoglobin A1C: 7.4 % — AB (ref 4.0–5.6)

## 2019-10-22 NOTE — Progress Notes (Signed)
Patient ID: Tamara Silva, female   DOB: [birth year], 59 y.o.   MRN: [id number]          Reason for Appointment: Follow-up visit for Type 2 Diabetes  Referring PCP: Dr. Karle Plumber   History of Present Illness:          Date of diagnosis of type 2 diabetes mellitus: 2000       Background history:  She thinks her blood sugar was over 600 at the time of diagnosis and was started on insulin from the start Also she took Metformin for couple of years but she thinks it was stopped because it did not work Her diabetes history is only available since about 2016 in her chart She has been mostly on Levemir insulin Appears to have had sporadic follow-up with only 4 or 5 A1c results available in the last 4 years  Recent history:   Most recent A1c is 7.4 compared to 8.8 done on 01/19/2019  INSULIN regimen is: [place] 64 units daily, NovoLog 20 units before meals       Non-insulin hypoglycemic drugs the patient is taking are: Victoza 1.8 mg daily  Current management, blood sugar patterns and problems identified:  She continues to have mostly high blood sugars and not clear why  She was also supposed to see the dietitian but appointments have not been made despite referrals  She still tends to have some high-fat foods  Also in the morning will eat cereal for breakfast  She does think that she is quite regular with her insulin and Victoza regimen  Blood sugar monitoring has been somewhat inadequate with less than twice a day  Although she had relatively better fasting blood sugars for about 3 days they have been consistently over 200 for the last week at least and not clear why  Occasionally she will have a snack in the night but not usually  No hypoglycemia          Side effects from medications have been: None     Meal times are: Dinner: 6-7 PM             Exercise:  Only some walking around the house  Glucose monitoring:  done about 2 times a day         Glucometer:   Accu-Chek.       Blood Glucose readings by time of day and averages from meter review:   PRE-MEAL Fasting Lunch Dinner Bedtime Overall  Glucose range: 99-295  249  131-230  181, 183   Mean/median:  206     180   POST-MEAL PC Breakfast PC Lunch PC Dinner  Glucose range:   104-119   Mean/median:      Previous data:   PRE-MEAL Fasting Lunch Dinner Bedtime Overall  Glucose range:  161-301  129-217  154  179   Mean/median:     ?   POST-MEAL PC Breakfast PC Lunch PC Dinner  Glucose range:   203-288   Mean/median:       Dietician visit, most recent: During last hospitalization  Weight history:  Wt Readings from Last 3 Encounters:  10/22/19 (!) 339 lb (153.8 kg)  08/30/19 (!) 325 lb 6.4 oz (147.6 kg)  08/23/19 (!) 322 lb (146.1 kg)    Glycemic control:   Lab Results  Component Value Date   HGBA1C 7.4 (A) 10/22/2019   HGBA1C 8.8 (A) 01/19/2019   HGBA1C 7.1 (H) 06/01/2018   Lab Results  Component Value Date  MICROALBUR 9.4 (H) 05/08/2019   LDLCALC 52 02/07/2019   CREATININE 1.59 (H) 09/01/2019   Lab Results  Component Value Date   MICRALBCREAT 7.1 05/08/2019    Lab Results  Component Value Date   FRUCTOSAMINE 261 05/08/2019    Office Visit on 10/22/2019  Component Date Value Ref Range Status  . Hemoglobin A1C 10/22/2019 7.4* 4.0 - 5.6 % Final    Allergies as of 10/22/2019      Reactions   Metolazone Other (See Comments)   Dizziness and falling   Plavix [clopidogrel Bisulfate] Other (See Comments)   High PRU's - non-responder   Ibuprofen Other (See Comments)   Pt states she is not supposed to take ibuprofen because of other meds she is taking   Nsaids Other (See Comments)   "I do not take NSAIDs d/t interfering with other meds"      Medication List       Accurate as of Oct 22, 2019  4:55 PM. If you have any questions, ask your nurse or doctor.        Accu-Chek Guide test strip Generic drug: glucose blood Use Accu Chek test strips as instructed  to check blood sugar three times daily.   Accu-Chek Guide w/Device Kit Use accu chek guide to check blood sugar three times daily.   acetaminophen 500 MG tablet Commonly known as: TYLENOL Take 500 mg by mouth every 6 (six) hours as needed for moderate pain or headache.   albuterol 108 (90 Base) MCG/ACT inhaler Commonly known as: ProAir HFA INHALE 2 PUFFS BY MOUTH 4 TIMES DAILY AS NEEDED   Anoro Ellipta 62.5-25 MCG/INH Aepb Generic drug: umeclidinium-vilanterol Inhale 1 puff into the lungs daily.   aspirin EC 81 MG tablet Take 1 tablet (81 mg total) by mouth daily.   BiDil 20-37.5 MG tablet Generic drug: isosorbide-hydrALAZINE Take 1 tablet by mouth 3 (three) times daily.   bisoprolol 5 MG tablet Commonly known as: ZEBETA Take 1 tablet (5 mg total) by mouth daily.   Easy Comfort Pen Needles 31G X 5 MM Misc Generic drug: Insulin Pen Needle USE 3 TIMES A DAY FOR INSULIN ADMINISTRATION   ferrous sulfate 325 (65 FE) MG tablet Take 1 tablet (325 mg total) by mouth daily after supper. Please take with a source of vitamin C   fluticasone 50 MCG/ACT nasal spray Commonly known as: FLONASE PLACE 1 SPRAY INTO BOTH NOSTRILS DAILY.   gabapentin 300 MG capsule Commonly known as: NEURONTIN TAKE 1 CAPSULE (300 MG TOTAL) BY MOUTH 3 (THREE) TIMES DAILY. START WITH 1 CAPSULE AT BEDTIME FOR 7 DAYS THEN INCREASE TO 3 TIMES A DAY IF TOLERATED   hydrocerin Crea Apply 1 application topically 2 (two) times daily.   ipratropium-albuterol 0.5-2.5 (3) MG/3ML Soln Commonly known as: DUONEB Use nebulizer to inhale contents of one vial every 6 hours as needed for shortness of breath, wheezing   NEEDLE (DISP) 26 G 26G X 1/2" Misc 1 application by Does not apply route daily.   NovoLOG FlexPen 100 UNIT/ML FlexPen Generic drug: insulin aspart Inject 18 units under the skin three times daily before meals. May also take 4-6 units before bedtime with snacks. What changed: additional instructions     omeprazole 40 MG capsule Commonly known as: PRILOSEC TAKE 1 CAPSULE (40 MG TOTAL) BY MOUTH DAILY.   Pazeo 0.7 % Soln Generic drug: Olopatadine HCl Place 1 drop into both eyes daily.   potassium chloride SA 20 MEQ tablet Commonly known as: KLOR-CON Take  2 tablets (40 mEq total) by mouth daily.   Restasis 0.05 % ophthalmic emulsion Generic drug: cycloSPORINE Place 1 drop into both eyes daily as needed.   rosuvastatin 40 MG tablet Commonly known as: CRESTOR TAKE 1 TABLET (40 MG TOTAL) BY MOUTH DAILY AT 6 PM.   sacubitril-valsartan 24-26 MG Commonly known as: ENTRESTO Take 1 tablet by mouth 2 (two) times daily.   spironolactone 25 MG tablet Commonly known as: ALDACTONE TAKE 1 TABLET (25 MG TOTAL) BY MOUTH DAILY.   torsemide 100 MG tablet Commonly known as: DEMADEX Take 1 tablet (100 mg total) by mouth 2 (two) times daily.   Tyler Aas FlexTouch 200 UNIT/ML FlexTouch Pen Generic drug: insulin degludec Inject 64 Units into the skin daily.   Victoza 18 MG/3ML Sopn Generic drug: liraglutide INJECT 1.8 MG INTO THE SKIN DAILY.   vitamin B-12 1000 MCG tablet Commonly known as: CYANOCOBALAMIN Take 1,000 mcg by mouth daily.   vitamin C 250 MG tablet Commonly known as: ASCORBIC ACID Take 2 tablets (500 mg total) by mouth daily.       Allergies:  Allergies  Allergen Reactions  . Metolazone Other (See Comments)    Dizziness and falling  . Plavix [Clopidogrel Bisulfate] Other (See Comments)    High PRU's - non-responder  . Ibuprofen Other (See Comments)    Pt states she is not supposed to take ibuprofen because of other meds she is taking  . Nsaids Other (See Comments)    "I do not take NSAIDs d/t interfering with other meds"    Past Medical History:  Diagnosis Date  . AICD (automatic cardioverter/defibrillator) present 04/04/2019  . Asthma   . CAD (coronary artery disease)   . Chronic combined systolic (congestive) and diastolic (congestive) heart failure ([place])    . Chronic iron deficiency anemia   . CVA (cerebral vascular accident) ([place]) 05/2018  . Diabetes mellitus without complication ([place])   . DOE (dyspnea on exertion)   . Edema of both legs   . Hepatic steatosis   . Hypertension   . Pulmonary emboli ([place]) 2014  . Respiratory failure ([place]) 09/2018  . Seizure ([place]) 05/2018    Past Surgical History:  Procedure Laterality Date  . BIOPSY  07/10/2018   Procedure: BIOPSY;  Surgeon: [name], MD;  Location: [facility];  Service: Gastroenterology;;  . COLONOSCOPY WITH PROPOFOL N/A 08/03/2013   Procedure: COLONOSCOPY WITH PROPOFOL;  Surgeon: Jeryl Columbia, MD;  Location: [facility];  Service: Endoscopy;  Laterality: N/A;  . COLONOSCOPY WITH PROPOFOL N/A 07/10/2018   Procedure: COLONOSCOPY WITH PROPOFOL;  Surgeon: [name], MD;  Location: [facility];  Service: Gastroenterology;  Laterality: N/A;  . CORONARY ANGIOPLASTY WITH STENT PLACEMENT     CAD in 2006 x 2 and 2009 2 more- place din REx in [place] and [place] med  . CORONARY ANGIOPLASTY WITH STENT PLACEMENT  10/07/2013   Xience [place] DES 2.75  mm x 15  mm  . ESOPHAGOGASTRODUODENOSCOPY (EGD) WITH PROPOFOL N/A 08/03/2013   Procedure: ESOPHAGOGASTRODUODENOSCOPY (EGD) WITH PROPOFOL;  Surgeon: Jeryl Columbia, MD;  Location: [facility];  Service: Endoscopy;  Laterality: N/A;  . ESOPHAGOGASTRODUODENOSCOPY (EGD) WITH PROPOFOL N/A 07/10/2018   Procedure: ESOPHAGOGASTRODUODENOSCOPY (EGD) WITH PROPOFOL;  Surgeon: [name], MD;  Location: [facility];  Service: Gastroenterology;  Laterality: N/A;  . ICD IMPLANT  04/04/2019  . ICD IMPLANT N/A 04/04/2019   Procedure: ICD IMPLANT;  Surgeon: Constance Haw, MD;  Location: [facility];  Service: Cardiovascular;  Laterality:  N/A;  . LEFT HEART CATHETERIZATION WITH CORONARY ANGIOGRAM N/A 10/07/2013   Procedure: LEFT HEART CATHETERIZATION WITH CORONARY ANGIOGRAM;  Surgeon: Leonie Man, MD;  Location: [facility];  Service: Cardiovascular;   Laterality: N/A;  . RIGHT HEART CATH N/A 11/06/2018   Procedure: RIGHT HEART CATH;  Surgeon: Larey Dresser, MD;  Location: [facility];  Service: Cardiovascular;  Laterality: N/A;  . RIGHT/LEFT HEART CATH AND CORONARY ANGIOGRAPHY N/A 02/12/2019   Procedure: RIGHT/LEFT HEART CATH AND CORONARY ANGIOGRAPHY;  Surgeon: Larey Dresser, MD;  Location: [facility];  Service: Cardiovascular;  Laterality: N/A;    Family History  Problem Relation Age of Onset  . Stroke Mother   . Hypertension Mother   . Colon cancer Father   . Diabetes Father   . Heart attack Father   . Sarcoidosis Other   . Breast cancer Paternal Aunt   . Emphysema Brother   . Lung disease Brother        Unknown type, 3 brothers, one with liver and lung disease  . Diabetes Brother   . Asthma Paternal Aunt   . Diabetes Sister     Social History:  reports that she quit smoking about 16 months ago. Her smoking use included cigarettes. She has a 10.00 pack-year smoking history. She has never used smokeless tobacco. She reports previous drug use. Drug: Cocaine. She reports that she does not drink alcohol.   Review of Systems  TSH was abnormal in 5/20 but subsequently normal   Lab Results  Component Value Date   TSH 3.11 05/08/2019   TSH 5.177 (H) 10/28/2018   TSH 1.816 05/30/2018   FREET4 0.83 05/08/2019     Lipid history: Has been treated with Crestor 40 mg daily, has history of CAD    Lab Results  Component Value Date   CHOL 137 02/07/2019   HDL 56 02/07/2019   LDLCALC 52 02/07/2019   TRIG 145 02/07/2019   CHOLHDL 2.4 02/07/2019           Hypertension: Treated with bisoprolol, also CHF has been present CHF treated with spironolactone and torsemide as well as isosorbide/hydralazine  BP Readings from Last 3 Encounters:  10/22/19 (!) 110/50  08/30/19 (!) 104/58  08/23/19 (!) 110/54   She has chronic renal insufficiency not related to diabetes   Lab Results  Component Value Date    CREATININE 1.59 (H) 09/01/2019   CREATININE 1.40 (H) 08/30/2019   CREATININE 1.84 (H) 08/14/2019    Most recent eye exam was in 10/2018 with [facility], unknown status of retinopathy  Most recent foot exam: 5/21  Currently known complications of diabetes: Neuropathy, microalbuminuria Neuropathic symptoms mostly controlled with gabapentin  Other active problems are history of coronary artery disease, cardiomyopathy and COPD She is using oxygen  LABS:  Office Visit on 10/22/2019  Component Date Value Ref Range Status  . Hemoglobin A1C 10/22/2019 7.4* 4.0 - 5.6 % Final    Physical Examination:  Thyroid not enlarged No lymphadenopathy in the neck  BP (!) 110/50 (BP Location: Left Arm, Patient Position: Sitting, Cuff Size: Large)   Pulse 65   Ht 5' 7.5" (1.715 m)   Wt (!) 339 lb (153.8 kg)   LMP 07/28/2013 Comment: perimenopausal  SpO2 95%   BMI 52.31 kg/m   Diabetic Foot Exam - Simple   Simple Foot Form Diabetic Foot exam was performed with the following findings: Yes   Visual Inspection No deformities, no ulcerations, no other skin  breakdown bilaterally: Yes See comments: Yes Sensation Testing Intact to touch and monofilament testing bilaterally: Yes See comments: Yes Pulse Check See comments: Yes Comments Mild decrease in monofilament sensation on the right plantar surfaces Mild right ankle edema Pedal pulses not palpable       ASSESSMENT:  Diabetes type 2 on insulin  See history of present illness for detailed discussion of current diabetes management, blood sugar patterns and problems identified  A1c is better at 7.4 She is on [place] and NovoLog and Victoza  However his blood sugars are consistently over 200 in the morning and frequently higher later in the day Appears to need more insulin overnight even with 64 units of Tresiba Still has not been seen by the nutritionist described referral and likely can do better with reducing high fat meals and  snacks Postprandial readings are not evidently very high  Also can do more readings after meals especially at night Needs basic diabetes education  Neuropathy: Symptoms controlled Has only minimal objective findings on sensory exam  History of abnormal TSH in 09/2018, will need to repeat today, no goiter on exam  History of CAD and hyperlipidemia: Will be due for follow-up lipids, not done since 9/20   PLAN:    . Glucose monitoring: More frequent monitoring especially after meals  2.  Diabetes education: . Patient will need consultation with dietitian and this will be ordered again  3.  Lifestyle changes: . Dietary changes: Avoid eating fruit and cereals.  Given basic information on breakfast foods and options for protein . Cut back on high fat foods like hot dogs  4.  Medication changes needed: . Increase Tresiba to 80 units . NovoLog 24 units with dinner and continue 20 with breakfast and lunch . To call if having any tendency to low sugar . No change in Victoza . Consider U-500 insulin  5.  Preventive care needed:  . Urine microalbumin, complete eye exam  6.  Follow-up: 6 weeks to reassess level of control and insulin adjustment  7.  Recheck TSH on next visit   Patient Instructions  Check blood sugars on waking up 5  days a week  Also check blood sugars about 2 hours after meals and do this after different meals by rotation  Recommended blood sugar levels on waking up are 90-130 and about 2 hours after meal is 130-160  Please bring your blood sugar monitor to each visit, thank you  Have an egg in am  Take 80 Tresiba  Novolog 20--20--24 at dinner          Elayne Snare 10/22/2019, 4:55 PM   Note: This office note was prepared with Dragon voice recognition system technology. Any transcriptional errors that result from this process are unintentional.

## 2019-10-22 NOTE — Patient Instructions (Signed)
Check blood sugars on waking up 5  days a week  Also check blood sugars about 2 hours after meals and do this after different meals by rotation  Recommended blood sugar levels on waking up are 90-130 and about 2 hours after meal is 130-160  Please bring your blood sugar monitor to each visit, thank you  Have an egg in am  Take 80 Tresiba  Novolog 20--20--24 at dinner

## 2019-10-23 ENCOUNTER — Other Ambulatory Visit: Payer: Self-pay

## 2019-10-23 ENCOUNTER — Telehealth: Payer: Self-pay | Admitting: Endocrinology

## 2019-10-23 ENCOUNTER — Ambulatory Visit (HOSPITAL_COMMUNITY)
Admission: RE | Admit: 2019-10-23 | Discharge: 2019-10-23 | Disposition: A | Discharge status: Home / Self Care | Payer: Medicaid Other | Source: Ambulatory Visit | Attending: Internal Medicine | Admitting: Internal Medicine

## 2019-10-23 ENCOUNTER — Encounter (HOSPITAL_COMMUNITY): Payer: Self-pay

## 2019-10-23 ENCOUNTER — Ambulatory Visit (INDEPENDENT_AMBULATORY_CARE_PROVIDER_SITE_OTHER): Payer: Medicaid Other | Admitting: *Deleted

## 2019-10-23 VITALS — BP 108/72 | HR 65 | Wt 337.0 lb

## 2019-10-23 DIAGNOSIS — Z794 Long term (current) use of insulin: Secondary | ICD-10-CM | POA: Diagnosis not present

## 2019-10-23 DIAGNOSIS — Z8674 Personal history of sudden cardiac arrest: Secondary | ICD-10-CM | POA: Diagnosis not present

## 2019-10-23 DIAGNOSIS — G4733 Obstructive sleep apnea (adult) (pediatric): Secondary | ICD-10-CM | POA: Diagnosis not present

## 2019-10-23 DIAGNOSIS — E1122 Type 2 diabetes mellitus with diabetic chronic kidney disease: Secondary | ICD-10-CM | POA: Diagnosis not present

## 2019-10-23 DIAGNOSIS — Z86711 Personal history of pulmonary embolism: Secondary | ICD-10-CM | POA: Insufficient documentation

## 2019-10-23 DIAGNOSIS — Z9981 Dependence on supplemental oxygen: Secondary | ICD-10-CM | POA: Diagnosis not present

## 2019-10-23 DIAGNOSIS — J449 Chronic obstructive pulmonary disease, unspecified: Secondary | ICD-10-CM | POA: Diagnosis not present

## 2019-10-23 DIAGNOSIS — G8929 Other chronic pain: Secondary | ICD-10-CM | POA: Diagnosis not present

## 2019-10-23 DIAGNOSIS — Z955 Presence of coronary angioplasty implant and graft: Secondary | ICD-10-CM | POA: Diagnosis not present

## 2019-10-23 DIAGNOSIS — Z833 Family history of diabetes mellitus: Secondary | ICD-10-CM | POA: Insufficient documentation

## 2019-10-23 DIAGNOSIS — I5022 Chronic systolic (congestive) heart failure: Secondary | ICD-10-CM | POA: Diagnosis present

## 2019-10-23 DIAGNOSIS — I255 Ischemic cardiomyopathy: Secondary | ICD-10-CM

## 2019-10-23 DIAGNOSIS — I272 Pulmonary hypertension, unspecified: Secondary | ICD-10-CM | POA: Insufficient documentation

## 2019-10-23 DIAGNOSIS — IMO0002 Reserved for concepts with insufficient information to code with codable children: Secondary | ICD-10-CM

## 2019-10-23 DIAGNOSIS — E785 Hyperlipidemia, unspecified: Secondary | ICD-10-CM | POA: Diagnosis not present

## 2019-10-23 DIAGNOSIS — N183 Chronic kidney disease, stage 3 unspecified: Secondary | ICD-10-CM | POA: Diagnosis not present

## 2019-10-23 DIAGNOSIS — Z87891 Personal history of nicotine dependence: Secondary | ICD-10-CM | POA: Insufficient documentation

## 2019-10-23 DIAGNOSIS — E1142 Type 2 diabetes mellitus with diabetic polyneuropathy: Secondary | ICD-10-CM

## 2019-10-23 DIAGNOSIS — I130 Hypertensive heart and chronic kidney disease with heart failure and stage 1 through stage 4 chronic kidney disease, or unspecified chronic kidney disease: Secondary | ICD-10-CM | POA: Diagnosis not present

## 2019-10-23 DIAGNOSIS — Z8673 Personal history of transient ischemic attack (TIA), and cerebral infarction without residual deficits: Secondary | ICD-10-CM | POA: Diagnosis not present

## 2019-10-23 DIAGNOSIS — I2510 Atherosclerotic heart disease of native coronary artery without angina pectoris: Secondary | ICD-10-CM | POA: Diagnosis not present

## 2019-10-23 DIAGNOSIS — Z8249 Family history of ischemic heart disease and other diseases of the circulatory system: Secondary | ICD-10-CM | POA: Diagnosis not present

## 2019-10-23 DIAGNOSIS — Z79899 Other long term (current) drug therapy: Secondary | ICD-10-CM | POA: Insufficient documentation

## 2019-10-23 DIAGNOSIS — Z7982 Long term (current) use of aspirin: Secondary | ICD-10-CM | POA: Insufficient documentation

## 2019-10-23 DIAGNOSIS — Z95810 Presence of automatic (implantable) cardiac defibrillator: Secondary | ICD-10-CM | POA: Diagnosis not present

## 2019-10-23 DIAGNOSIS — M545 Low back pain: Secondary | ICD-10-CM | POA: Diagnosis not present

## 2019-10-23 DIAGNOSIS — I252 Old myocardial infarction: Secondary | ICD-10-CM | POA: Insufficient documentation

## 2019-10-23 LAB — CUP PACEART REMOTE DEVICE CHECK
Battery Remaining Longevity: 135 mo
Battery Voltage: 3.09 V
Brady Statistic RV Percent Paced: 0.02 %
Date Time Interrogation Session: 20210525083649
HighPow Impedance: 66 Ohm
Implantable Lead Implant Date: 20201104
Implantable Lead Location: 753860
Implantable Pulse Generator Implant Date: 20201104
Lead Channel Impedance Value: 304 Ohm
Lead Channel Impedance Value: 380 Ohm
Lead Channel Pacing Threshold Amplitude: 0.625 V
Lead Channel Pacing Threshold Pulse Width: 0.4 ms
Lead Channel Sensing Intrinsic Amplitude: 16 mV
Lead Channel Sensing Intrinsic Amplitude: 16 mV
Lead Channel Setting Pacing Amplitude: 2.5 V
Lead Channel Setting Pacing Pulse Width: 0.4 ms
Lead Channel Setting Sensing Sensitivity: 0.3 mV

## 2019-10-23 LAB — COMPREHENSIVE METABOLIC PANEL
ALT: 11 U/L (ref 0–35)
AST: 15 U/L (ref 0–37)
Albumin: 3.8 g/dL (ref 3.5–5.2)
Alkaline Phosphatase: 96 U/L (ref 39–117)
BUN: 77 mg/dL — ABNORMAL HIGH (ref 6–23)
CO2: 34 mEq/L — ABNORMAL HIGH (ref 19–32)
Calcium: 9.1 mg/dL (ref 8.4–10.5)
Chloride: 99 mEq/L (ref 96–112)
Creatinine, Ser: 2.4 mg/dL — ABNORMAL HIGH (ref 0.40–1.20)
GFR: 24.92 mL/min — ABNORMAL LOW (ref >60.00)
Glucose, Bld: 116 mg/dL — ABNORMAL HIGH (ref 70–99)
Potassium: 4.7 mEq/L (ref 3.5–5.1)
Sodium: 140 mEq/L (ref 135–145)
Total Bilirubin: 0.2 mg/dL (ref 0.2–1.2)
Total Protein: 8 g/dL (ref 6.0–8.3)

## 2019-10-23 LAB — LIPID PANEL
Cholesterol: 150 mg/dL (ref 0–200)
HDL: 56.5 mg/dL (ref >39.00)
LDL Cholesterol: 68 mg/dL (ref 0–99)
NonHDL: 93.53
Total CHOL/HDL Ratio: 3
Triglycerides: 129 mg/dL (ref 0.0–149.0)
VLDL: 25.8 mg/dL (ref 0.0–40.0)

## 2019-10-23 LAB — TSH: TSH: 3.46 u[IU]/mL (ref 0.35–4.50)

## 2019-10-23 MED ORDER — NOVOLOG FLEXPEN 100 UNIT/ML ~~LOC~~ SOPN: PEN_INJECTOR | SUBCUTANEOUS | 4 refills | Status: DC

## 2019-10-23 MED ORDER — METOLAZONE 2.5 MG PO TABS: 2.5000 mg | ORAL_TABLET | Freq: Every day | ORAL | 0 refills | Status: DC | PRN

## 2019-10-23 MED ORDER — TRESIBA FLEXTOUCH 200 UNIT/ML ~~LOC~~ SOPN: PEN_INJECTOR | SUBCUTANEOUS | 2 refills | Status: DC

## 2019-10-23 NOTE — Telephone Encounter (Signed)
Patient needs updated prescriptions sent to  [facility], [place] Phone:  [phone]  Fax:  [phone]     She said Dr changed her Tamara Silva from 64 units to 80 units and Novolog from 18 units to 20 in the morning, 20 at lunch and 24 at dinner.

## 2019-10-23 NOTE — Patient Instructions (Signed)
You have been given a prescription for Metolazone 2.5mg  (1 tab) to be taken as needed for fluid over load. PLEASE CALL OFFICE TO BE GIVEN DIRECTIONS TO TAKE THIS  Your physician recommends that you schedule a follow-up appointment in: 2-3 months with Dr Aundra Dubin  Please call office at [phone] option 2 if you have any questions or concerns.   At the [facility], you and your health needs are our priority. As part of our continuing mission to provide you with exceptional heart care, we have created designated Provider Care Teams. These Care Teams include your primary Cardiologist (physician) and Advanced Practice Providers (APPs- Physician Assistants and Nurse Practitioners) who all work together to provide you with the care you need, when you need it.   You may see any of the following providers on your designated Care Team at your next follow up: [name] Kitchen Dr Glori Bickers . Dr Loralie Champagne . Darrick Grinder, NP . Lyda Jester, PA . Audry Riles, PharmD   Please be sure to bring in all your medications bottles to every appointment.

## 2019-10-23 NOTE — Telephone Encounter (Signed)
Rx resent.

## 2019-10-23 NOTE — Progress Notes (Signed)
Remote ICD transmission.   

## 2019-10-23 NOTE — Progress Notes (Signed)
[place] Silva, DOB 08-Jul-1960, MRN [id number]   Provider location: [facility] Type of Visit: Established Silva   PCP:  Ladell Pier, MD  HF Cardiology: Dr Aundra Dubin  Reason for Visit: F/u for chronic systolic heart failure    History of Present Illness: Tam[place] Silva is a 59 y.o. 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  She has had frequent admits for anemia and CHF with transfusions and diuresis. Prasugrel was stopped. No source of bleeding found on EGD and colonoscopy.   She is on home 02 and nocturnal BiPAP for OHS/OSA and COPD.   She no longer smokes. Hx of PE in 2014.  Hx of bradycardia with Coreg at higher dose.  Echo 05/31/18 with EF 40-45%, PA peak pressure 71 mmHg.    Admitted 10/25/18 with SOB and edema, + productive cough.  This started over a few weeks prior to admission. Hospital course complicated by PEA arrest on 10/28/18. CPR for 25 minutes. Extubated 10/30/18.  Developed AKI.  HF team followed closely to optimize HF.  She was massively volume overloaded and extensively diuresed, weight down 65 lbs total.  Echo showed EF down to 30-35%.  No coronary angiography due to AKI.   Readmitted  11/17/18 with lower extremity edema and volume overload. Diuresed with IV lasix and transitioned to torsemide 40 mg twice daily. Discharged on 11/21/18 to home on oxygen.   She had RHC/LHC in 9/20, most significant stenosis was 75% ostial D1.  There was mild to moderate nonobstructive disease in other vessels. There was not an interventional target.  Filling pressures were mildly elevated with preserved cardiac output.  Torsemide was increased.   Echo in 10/20 showed that EF remained 30-35% with wall motion abnormalities, RV appeared normal.   Medtronic ICD placed in 11/20.   Had recent clinic f/u 2/21 and was volume overload on exam and  based on Optivol and ReDs clip, 44%. Was symptomatic w/ exertional dyspnea and 3 pillow orthopnea. Torsemide was increased to 80 mg bid.  Had return visit on 3/16. Continued w/ dyspnea and endorsed cough. ReDs clip measurement higher at 50%. She was referred to remote health for home IV lasix and advised to increase PO torsemide (after IV Lasix) to 100 mg bid. Given cough, CXR was also ordered and c/w PNA. She was started on abx for CAP, treated w/ Augmentin 500 mg tid x 7 days. After remote health, she had return f/u and her volume status had improved w/ IV Lasix and she was continued to torsemide 100 mg bid.  She return to clinic today for 6 week f/u. Doing well. No complaints. Continues w/ supplemental O2, 2L at rest and 3L w/ activities, which is her usual baseline. She has not required increase. Her wt however had been trending up, up 12 lb from previous visit. Reports full compliance w/ torsemide. Device interrogated. Impedence borderline, hugging reference line.  Index starting to trend up but below threshold. No AF. No VT/VF. She is not very active. Activity level ~1 hr/day. States chronic low back pain limits her activity.   Device interrogation: Impedence borderline, hugging reference line.  Index starting to trend up but below threshold. No AF. No VT/VF. She is not very active. Activity level ~1 hr/day.   ECG (personally reviewed): Not performed  Labs (7/20): K 3.7, creatinine 1.56 Labs (9/20): K 3.7, creatinine 1.46, LDL 52, HDL 56  Labs (12/20): K 3.8, creatinine 1.51 Labs (1/21): hgb 10.6, K 4.4, creatinine 1.91 Labs (2/21) K 4.2, creatinine 1.72  Labs (3/21) K 4.5, creatinine 1.84   PMH: 1. CVA (1/20). 2. Type 2 diabetes 3. HTN 4. H/o seizures 5. PEA arrest (5/20) with CPR.  6. COPD: Home oxygen.  Stopped smoking in 1/15.  7. CAD: PCI [facility] 2006, [facility] 2009.  - Inferior STEMI 5/15, DES to RCA.  - Poor responder to Plavix.  - LHC (9/20): 75% ostial D1, 40-50%  mLCx, 40% proximal PLV.  8. Hyperlipidemia 9. PE: 2014, LLL.  10. OHS/OSA: Home oxygen and Bipap used.  11. Chronic systolic CHF: Suspect primarily ischemic cardiomyopathy. Medtronic ICD.  - Echo (5/15): EF 35-40% - Echo (2/16): EF 40-45% - Echo (1/20): EF 40-45% - Echo (5/20): EF 30-35%, mild RV dilation.  - RHC (5/20): mean RA 9, PA 62/22 mean 37, mean PCWP 15, CI 4.29, PVR 2.1 WU.  - RHC (9/20): mean RA 11, PA 66/18 mean 37, mean PCWP 19, CI 3.12, PVR 2.4 WU - Echo (10/20): EF 30-35% with wall motion abnormalities, normal RV.  12. CKD: Stage 3.  13. Fe deficiency anemia: EGD and colonoscopy without definite source of bleeding.   ROS: All systems negative except as listed in HPI, PMH and Problem List.  SH:  Social History   Socioeconomic History  . Marital status: Single    Spouse name: Not on file  . Number of children: 1  . Years of education: Not on file  . Highest education level: Not on file  Occupational History  . Not on file  Tobacco Use  . Smoking status: Former Smoker    Packs/day: 0.50    Years: 20.00    Pack years: 10.00    Types: Cigarettes    Quit date: 05/31/2018    Years since quitting: 1.3  . Smokeless tobacco: Never Used  . Tobacco comment: smoked off and on x 20 years  Substance and Sexual Activity  . Alcohol use: No    Alcohol/week: 0.0 standard drinks  . Drug use: Not Currently    Types: Cocaine  . Sexual activity: Not Currently  Other Topics Concern  . Not on file  Social History Narrative   Origibnally from [place]   Most recently from [place]   Daughter lives in town      On Social security to CHF, COPD, CAD   Social Determinants of Health   Financial Resource Strain:   . Difficulty of Paying Living Expenses:   Food Insecurity:   . Worried About Charity fundraiser in [place] Last Year:   . Arboriculturist in [place] Last Year:   Transportation Needs:   . Film/video editor (Medical):   [name] Kitchen Lack of Transportation (Non-Medical):     Physical Activity:   . Days of Exercise per Week:   . Minutes of Exercise per Session:   Stress:   . Feeling of Stress :   Social Connections:   . Frequency of Communication with Friends and Family:   . Frequency of Social Gatherings with Friends and Family:   . Attends Religious Services:   . Active Member of Clubs or Organizations:   . Attends Archivist Meetings:   [name] Kitchen Marital Status:   Intimate Partner Violence:   . Fear of Current or Ex-Partner:   . Emotionally  Abused:   [name] Kitchen Physically Abused:   . Sexually Abused:     FH:  Family History  Problem Relation Age of Onset  . Stroke Mother   . Hypertension Mother   . Colon cancer Father   . Diabetes Father   . Heart attack Father   . Sarcoidosis Other   . Breast cancer Paternal Aunt   . Emphysema Brother   . Lung disease Brother        Unknown type, 3 brothers, one with liver and lung disease  . Diabetes Brother   . Asthma Paternal Aunt   . Diabetes Sister     Current Outpatient Medications  Medication Sig Dispense Refill  . acetaminophen (TYLENOL) 500 MG tablet Take 500 mg by mouth every 6 (six) hours as needed for moderate pain or headache.    . albuterol (PROAIR HFA) 108 (90 Base) MCG/ACT inhaler INHALE 2 PUFFS BY MOUTH 4 TIMES DAILY AS NEEDED 18 g 11  . aspirin EC 81 MG tablet Take 1 tablet (81 mg total) by mouth daily. 90 tablet 3  . bisoprolol (ZEBETA) 5 MG tablet Take 1 tablet (5 mg total) by mouth daily. 30 tablet 6  . Blood Glucose Monitoring Suppl (ACCU-CHEK GUIDE) w/Device KIT Use accu chek guide to check blood sugar three times daily. 1 kit 0  . ferrous sulfate 325 (65 FE) MG tablet Take 1 tablet (325 mg total) by mouth daily after supper. Please take with a source of vitamin C 90 tablet 3  . fluticasone (FLONASE) 50 MCG/ACT nasal spray PLACE 1 SPRAY INTO BOTH NOSTRILS DAILY. 16 g 1  . gabapentin (NEURONTIN) 300 MG capsule TAKE 1 CAPSULE (300 MG TOTAL) BY MOUTH 3 (THREE) TIMES DAILY. START WITH 1  CAPSULE AT BEDTIME FOR 7 DAYS THEN INCREASE TO 3 TIMES A DAY IF TOLERATED 90 capsule 3  . glucose blood (ACCU-CHEK GUIDE) test strip Use Accu Chek test strips as instructed to check blood sugar three times daily. 100 each 2  . hydrocerin (EUCERIN) CREA Apply 1 application topically 2 (two) times daily.  0  . insulin degludec (TRESIBA FLEXTOUCH) 200 UNIT/ML FlexTouch Pen Inject 64 Units into [place] skin daily. 15 mL 2  . Insulin Pen Needle (EASY COMFORT PEN NEEDLES) 31G X 5 MM MISC USE 3 TIMES A DAY FOR INSULIN ADMINISTRATION 100 each 12  . ipratropium-albuterol (DUONEB) 0.5-2.5 (3) MG/3ML SOLN Use nebulizer to inhale contents of one vial every 6 hours as needed for shortness of breath, wheezing 360 mL 11  . isosorbide-hydrALAZINE (BIDIL) 20-37.5 MG tablet Take 1 tablet by mouth 3 (three) times daily. 270 tablet 3  . liraglutide (VICTOZA) 18 MG/3ML SOPN INJECT 1.8 MG INTO [place] SKIN DAILY. 9 mL 2  . NEEDLE, DISP, 26 G 26G X 1/2" MISC 1 application by Does not apply route daily. 100 each 0  . NOVOLOG FLEXPEN 100 UNIT/ML FlexPen Inject 18 units under [place] skin three times daily before meals. May also take 4-6 units before bedtime with snacks. (Silva taking differently: Inject 18 units under [place] skin three times daily before meals.) 30 mL 4  . omeprazole (PRILOSEC) 40 MG capsule TAKE 1 CAPSULE (40 MG TOTAL) BY MOUTH DAILY. 30 capsule 2  . PAZEO 0.7 % SOLN Place 1 drop into both eyes daily.     . RESTASIS 0.05 % ophthalmic emulsion Place 1 drop into both eyes daily as needed.     . rosuvastatin (CRESTOR) 40 MG tablet TAKE 1 TABLET (40 MG TOTAL)  BY MOUTH DAILY AT 6 PM. 30 tablet 3  . sacubitril-valsartan (ENTRESTO) 24-26 MG Take 1 tablet by mouth 2 (two) times daily. 60 tablet 5  . torsemide (DEMADEX) 100 MG tablet Take 1 tablet (100 mg total) by mouth 2 (two) times daily. 60 tablet 4  . umeclidinium-vilanterol (ANORO ELLIPTA) 62.5-25 MCG/INH AEPB Inhale 1 puff into [place] lungs daily. 21 each 0  . vitamin B-12  (CYANOCOBALAMIN) 1000 MCG tablet Take 1,000 mcg by mouth daily.    . vitamin C (ASCORBIC ACID) 250 MG tablet Take 2 tablets (500 mg total) by mouth daily. 90 tablet 3  . potassium chloride SA (KLOR-CON) 20 MEQ tablet Take 2 tablets (40 mEq total) by mouth daily. 60 tablet 3  . spironolactone (ALDACTONE) 25 MG tablet TAKE 1 TABLET (25 MG TOTAL) BY MOUTH DAILY. 90 tablet 1   No current facility-administered medications for this encounter.    Vitals:   10/23/19 0927  BP: 108/72  Pulse: 65  SpO2: 97%  Weight: (!) 152.9 kg (337 lb)   Wt Readings from Last 3 Encounters:  10/23/19 (!) 152.9 kg (337 lb)  10/22/19 (!) 153.8 kg (339 lb)  08/30/19 (!) 147.6 kg (325 lb 6.4 oz)   PHYSICAL EXAM: General:  Well appearing, morbidly obese AAF. No respiratory difficulty HEENT: normal Neck: supple. Thick neck, JVD assessment difficult. Carotids 2+ bilat; no bruits. No lymphadenopathy or thyromegaly appreciated. Cor: PMI nondisplaced. Regular rate & rhythm. No rubs, gallops or murmurs. Lungs: clear, no wheezing  Abdomen: obese, soft, nontender, nondistended. No hepatosplenomegaly. No bruits or masses. Good bowel sounds. Extremities: no cyanosis, clubbing, rash, edema Neuro: alert & oriented x 3, cranial nerves grossly intact. moves all 4 extremities w/o difficulty. Affect pleasant.    ASSESSMENT & PLAN: 1. H/O PEA arrest: Due to severe hypoxemia in 5/20 in setting of CHF exacerbation.   2. Chronic systolic CHF: Suspect ischemic cardiomyopathy. Medtronic ICD.  Echo in 1/20 with EF 40-45%, RV moderately dilated, PASP 71 mmHg. Echo in 5/20 with lower EF, 30-35%, and mildly dilated RV. Suspect significant RV failure, may be related to OHS/OSA and COPD. She was massively volume overloaded during 5/20 admission and diuresed extensively.  RHC done 11/06/18 showed high cardiac output and minimally elevated filling pressures after diuresis.  PVR was not elevated, appeared to be high output PH.  RHC was done  again in 9/20, filling pressures were elevated with preserved cardiac output.  Echo in 10/20 showed EF still 30-35%.   - NYHA Class III but more so limited by chronic low back pain.  - volume status ok on exam and by optivol, but concern w/ wt increase. Impedence borderline, hugging reference line.  Index starting to trend up but below threshold. Reports full compliance w/ torsemide.  - Continue torsemide 100 mg bid - Add metolazone 2.5 mg. Will have her take a dose tomorrow x 1 + extra 40 mEq of KCl. Subsequent doses PRN as directed by clinic. - She will continue daily wts and will notify clinic if > 3 lb wt gain in 24 hrs or > 5 lb in 1 week. - Continue Entresto 24-26 bid (BP too soft for titration) - Continue spironolactone 25 mg daily.  - Given significant lung disease/COPD, she is on bisoprolol 5 mg daily (more beta-1 selective).  - GFR too low for SGLT2i - She was seen by PCP yesterday and CMP collected. Results pending. Will follow - Discussed low sodium diet and wt loss. Advised to increase physical activity  3. CKD: Stage III. Last creatinine 1.8   - CMP drawn at PCP office yesterday. Results pending.   4. COPD: On home oxygen at baseline. Has quit smoking.  5. OHS/OSA: On home oxygen at all times but poor compliance w/ CPAP.  - advised to improve compliance w/ CPAP 6. CAD: History of PCI, most recently had inferior STEMI in 5/15 with DES to RCA. With fall in EF this year, coronary angiography was done in 9/20.  There was 75% ostial D1 stenosis but no good target for PCI. She denies CP  - Continue ASA 81 and Crestor 40 daily.  PCP checked lipid panel yesterday. Results pending.    7. Remote PE: She has not been anticoagulated due to h/o GI bleeding.  8. Anemia: History of GI bleeding, had endoscopies earlier this year with no bleeding source found. Also with history of B12 deficiency.  9. Pulmonary hypertension: Pulmonary venous hypertension on last RHC in 9/20.  - continue  diuretics per above   F/u in 8 weeks or sooner if needed.   Signed, Lyda Jester, PA-C  10/23/2019  [facility] [address] [facility] [place] [address] [phone] (office) [phone] (fax)

## 2019-10-26 ENCOUNTER — Other Ambulatory Visit: Payer: Self-pay | Admitting: Internal Medicine

## 2019-10-26 DIAGNOSIS — I25119 Atherosclerotic heart disease of native coronary artery with unspecified angina pectoris: Secondary | ICD-10-CM

## 2019-10-30 ENCOUNTER — Telehealth: Payer: Self-pay | Admitting: Internal Medicine

## 2019-10-30 NOTE — Telephone Encounter (Signed)
-----   Message from Elayne Snare, MD sent at 10/29/2019  9:50 PM EDT ----- Kidney function test is worse, need to know if she is still seeing a doctor at [facility] otherwise needs to make an appointment

## 2019-10-31 ENCOUNTER — Telehealth (HOSPITAL_COMMUNITY): Payer: Self-pay | Admitting: Cardiology

## 2019-10-31 ENCOUNTER — Telehealth: Payer: Self-pay | Admitting: Internal Medicine

## 2019-10-31 DIAGNOSIS — N184 Chronic kidney disease, stage 4 (severe): Secondary | ICD-10-CM

## 2019-10-31 MED ORDER — TORSEMIDE 20 MG PO TABS: 80.0000 mg | ORAL_TABLET | Freq: Two times a day (BID) | ORAL | 3 refills | Status: DC

## 2019-10-31 MED ORDER — POTASSIUM CHLORIDE CRYS ER 20 MEQ PO TBCR: 20.0000 meq | EXTENDED_RELEASE_TABLET | Freq: Every day | ORAL | 3 refills | Status: DC

## 2019-10-31 MED ORDER — TORSEMIDE 20 MG PO TABS: ORAL_TABLET | ORAL | 3 refills | Status: DC

## 2019-10-31 NOTE — Telephone Encounter (Signed)
-----   Message from Consuelo Pandy, [place] sent at 10/30/2019  3:33 PM EDT ----- Yes. Stop metolazone. Hold tonight's dose of torsemide and hold both doses tomorrow. Restart torsemide again on Thursday, 6/3, at reduced dose 100 mg q am and 80 mg qpm. Hold spiro for now. Hold KCl during torsemide hold. Resume KCl on 6/3 at lower dose 20 mEq daily. Repeat BMP in 1 week and check BNP.

## 2019-10-31 NOTE — Telephone Encounter (Signed)
Pt aware and voiced understanding, reports she was also given instructions by [facility] provider.repeat labs scheduled for 6/6

## 2019-10-31 NOTE — Telephone Encounter (Signed)
Pharmacy has received new rx and will be delivered tomorrow

## 2019-10-31 NOTE — Telephone Encounter (Signed)
Phone call placed to patient today.  Patient informed that her kidney function has worsened.  I have communicated with the cardiologist PA Rosita Fire who has been seeing her recently about the results sent to me from the endocrinologist.  She requested some adjustments to be made in patient's cardiac medications. Advised patient to -Hold metolazone and spironolactone for now -Hold torsemide (currently 100 mg BID) and potassium for 3 doses. (The potassium is written as 20 mEq 2 tablets daily but patient has been taking as 1 tablet twice a day). -Then restart torsemide at 80 mg twice a day and resume the potassium. -Return to the lab in 1 week for recheck of chemistry. Patient was able to verbalize these instructions.

## 2019-10-31 NOTE — Telephone Encounter (Signed)
Contacted pt and went over Dr. Wynetta Emery message. Pt states that Dr. Dwyane Dee office is going to set her up with [facility]. Pt doesn't have any questions or concerns

## 2019-11-01 ENCOUNTER — Ambulatory Visit: Payer: Medicaid Other | Admitting: Dietician

## 2019-11-06 ENCOUNTER — Other Ambulatory Visit: Payer: Self-pay

## 2019-11-06 ENCOUNTER — Emergency Department (HOSPITAL_COMMUNITY): Payer: Medicaid Other

## 2019-11-06 ENCOUNTER — Emergency Department (HOSPITAL_COMMUNITY)
Admission: EM | Admit: 2019-11-06 | Discharge: 2019-11-06 | Disposition: A | Discharge status: Home / Self Care | Payer: Medicaid Other | Attending: Emergency Medicine | Admitting: Emergency Medicine

## 2019-11-06 ENCOUNTER — Encounter (HOSPITAL_COMMUNITY): Payer: Self-pay | Admitting: Emergency Medicine

## 2019-11-06 DIAGNOSIS — Z95810 Presence of automatic (implantable) cardiac defibrillator: Secondary | ICD-10-CM | POA: Insufficient documentation

## 2019-11-06 DIAGNOSIS — Z794 Long term (current) use of insulin: Secondary | ICD-10-CM | POA: Insufficient documentation

## 2019-11-06 DIAGNOSIS — I2510 Atherosclerotic heart disease of native coronary artery without angina pectoris: Secondary | ICD-10-CM | POA: Insufficient documentation

## 2019-11-06 DIAGNOSIS — E1122 Type 2 diabetes mellitus with diabetic chronic kidney disease: Secondary | ICD-10-CM | POA: Insufficient documentation

## 2019-11-06 DIAGNOSIS — M7989 Other specified soft tissue disorders: Secondary | ICD-10-CM | POA: Diagnosis present

## 2019-11-06 DIAGNOSIS — I5022 Chronic systolic (congestive) heart failure: Secondary | ICD-10-CM | POA: Diagnosis not present

## 2019-11-06 DIAGNOSIS — E1140 Type 2 diabetes mellitus with diabetic neuropathy, unspecified: Secondary | ICD-10-CM | POA: Diagnosis not present

## 2019-11-06 DIAGNOSIS — N183 Chronic kidney disease, stage 3 unspecified: Secondary | ICD-10-CM | POA: Insufficient documentation

## 2019-11-06 DIAGNOSIS — I130 Hypertensive heart and chronic kidney disease with heart failure and stage 1 through stage 4 chronic kidney disease, or unspecified chronic kidney disease: Secondary | ICD-10-CM | POA: Diagnosis not present

## 2019-11-06 DIAGNOSIS — R609 Edema, unspecified: Secondary | ICD-10-CM | POA: Diagnosis not present

## 2019-11-06 DIAGNOSIS — I252 Old myocardial infarction: Secondary | ICD-10-CM | POA: Insufficient documentation

## 2019-11-06 LAB — CBC
HCT: 31.7 % — ABNORMAL LOW (ref 36.0–46.0)
Hemoglobin: 9.4 g/dL — ABNORMAL LOW (ref 12.0–15.0)
MCH: 26.2 pg (ref 26.0–34.0)
MCHC: 29.7 g/dL — ABNORMAL LOW (ref 30.0–36.0)
MCV: 88.3 fL (ref 80.0–100.0)
Platelets: 261 10*3/uL (ref 150–400)
RBC: 3.59 MIL/uL — ABNORMAL LOW (ref 3.87–5.11)
RDW: 14.9 % (ref 11.5–15.5)
WBC: 7.9 10*3/uL (ref 4.0–10.5)
nRBC: 0 % (ref 0.0–0.2)

## 2019-11-06 LAB — BASIC METABOLIC PANEL
Anion gap: 10 (ref 5–15)
BUN: 81 mg/dL — ABNORMAL HIGH (ref 6–20)
CO2: 28 mmol/L (ref 22–32)
Calcium: 8.9 mg/dL (ref 8.9–10.3)
Chloride: 102 mmol/L (ref 98–111)
Creatinine, Ser: 2.32 mg/dL — ABNORMAL HIGH (ref 0.44–1.00)
GFR calc Af Amer: 26 mL/min — ABNORMAL LOW (ref >60)
GFR calc non Af Amer: 22 mL/min — ABNORMAL LOW (ref >60)
Glucose, Bld: 142 mg/dL — ABNORMAL HIGH (ref 70–99)
Potassium: 4.9 mmol/L (ref 3.5–5.1)
Sodium: 140 mmol/L (ref 135–145)

## 2019-11-06 LAB — TROPONIN I (HIGH SENSITIVITY)
Troponin I (High Sensitivity): 7 ng/L (ref <18)
Troponin I (High Sensitivity): 10 ng/L (ref <18)

## 2019-11-06 MED ORDER — FUROSEMIDE 10 MG/ML IJ SOLN
80.0000 mg | Freq: Once | INTRAMUSCULAR | Status: AC
  Administered 2019-11-06: 80 mg via INTRAVENOUS
  Filled 2019-11-06: qty 8

## 2019-11-06 MED ORDER — SODIUM CHLORIDE 0.9% FLUSH: 3.0000 mL | Freq: Once | INTRAVENOUS | Status: DC

## 2019-11-06 NOTE — ED Provider Notes (Signed)
[facility] EMERGENCY DEPARTMENT Provider Note   CSN: [id number] Arrival date & time: 11/06/19  2202     History Chief Complaint  Patient presents with  . Leg Swelling    Tamara Silva is a 59 y.o. female.  59yo female with extensive history as listed below including CAD, combined systolic/diastolic CHF, medtronic AICD brought in by EMS with complaint of lower extremity edema. Patient states she was told last Wednesday to discontinue use of her 100 mg twice daily torsemide for the remainder of Wednesday and Thursdays doses due to increase in her Cr.  Patient resumed with 80 mg twice daily on Friday and has been compliant.  Patient states swelling started in her lower extremities on Saturday, progressively worsening, has very mild shortness of breath on her baseline 2 L nasal cannula.  Patient reports 3 pillow orthopnea, no change from baseline, denies any chest pain or abdominal swelling.  History significant for admission to the hospital 09/2018 with heart failure, PEA arrest with 25 minutes of CPR, diuresed 65 pounds.  Patient was seen in heart failure clinic on May 25 with note of increasing weight, up 12 pounds from previous visit.  Patient states that she has not followed her weight at home.  Patient's Medtronic device was interrogated showing "Impedence borderline, hugging reference line.  Index starting to trend up but below threshold. No AF. No VT/VF."        Past Medical History:  Diagnosis Date  . AICD (automatic cardioverter/defibrillator) present 04/04/2019  . Asthma   . CAD (coronary artery disease)   . Chronic combined systolic (congestive) and diastolic (congestive) heart failure ([place])   . Chronic iron deficiency anemia   . CVA (cerebral vascular accident) ([place]) 05/2018  . Diabetes mellitus without complication ([place])   . DOE (dyspnea on exertion)   . Edema of both legs   . Hepatic steatosis   . Hypertension   . Pulmonary emboli ([place]) 2014  .  Respiratory failure ([place]) 09/2018  . Seizure ([place]) 05/2018    Patient Active Problem List   Diagnosis Date Noted  . Chronic systolic (congestive) heart failure ([place]) 04/04/2019  . Diabetic peripheral neuropathy ([place]) 01/19/2019  . Former smoker 01/19/2019  . CKD (chronic kidney disease) stage 3, GFR 30-59 ml/min 01/19/2019  . Cardiac arrest ([place]) 10/28/2018  . Seizure ([place]) 10/28/2018  . Noncompliance with treatment plan 07/24/2018  . General patient noncompliance 07/24/2018  . Noncompliance with diet and medication regimen 07/24/2018  . Dysarthria   . BiPAP (biphasic positive airway pressure) dependence   . Pancytopenia ([place]) 07/08/2018  . Type II diabetes mellitus with renal manifestations ([place]) 07/08/2018  . Hepatic steatosis 06/22/2018  . Chronic respiratory failure with hypoxia and hypercapnia (HCC)   . On home O2   . Slow transit constipation   . Cocaine abuse ([place])   . Diabetes mellitus type 2 in obese ([place])   . Generalized tonic-clonic seizure ([place]) 06/05/2018  . Cerebral embolism with cerebral infarction 06/01/2018  . Pressure injury of skin 05/30/2018  . Iron deficiency anemia 01/27/2018  . Tobacco abuse disorder 10/03/2017  . Dyslipidemia 04/29/2016  . Primary insomnia 04/29/2016  . Obesity hypoventilation syndrome ([place])   . OSA (obstructive sleep apnea)   . Dyslipidemia associated with type 2 diabetes mellitus ([place]) 05/21/2015  . COPD GOLD III  06/10/2014  . CAD S/P percutaneous coronary angioplasty - prior PCI to LAD; RCA PCI: new Xience Alpine DES 2.75 mm x 15 mm  10/07/2013  .  Morbid obesity ([place]) 07/30/2013  . Chronic combined systolic and diastolic heart failure ([place]) 07/10/2013  . Chronic pulmonary embolism ([place]) 02/08/2013  . Bipolar disorder ([place]) 02/08/2013  . Essential hypertension 02/08/2013  . COPD (chronic obstructive pulmonary disease) ([place]) 10/15/2012  . Pulmonary embolism on right [facility]) 10/15/2012    Past Surgical History:  Procedure  Laterality Date  . BIOPSY  07/10/2018   Procedure: BIOPSY;  Surgeon: [name], MD;  Location: [facility];  Service: Gastroenterology;;  . COLONOSCOPY WITH PROPOFOL N/A 08/03/2013   Procedure: COLONOSCOPY WITH PROPOFOL;  Surgeon: Jeryl Columbia, MD;  Location: [facility];  Service: Endoscopy;  Laterality: N/A;  . COLONOSCOPY WITH PROPOFOL N/A 07/10/2018   Procedure: COLONOSCOPY WITH PROPOFOL;  Surgeon: [name], MD;  Location: [facility];  Service: Gastroenterology;  Laterality: N/A;  . CORONARY ANGIOPLASTY WITH STENT PLACEMENT     CAD in 2006 x 2 and 2009 2 more- place din REx in [place] and [place] [facility]  . CORONARY ANGIOPLASTY WITH STENT PLACEMENT  10/07/2013   Xience Alpine DES 2.75  mm x 15  mm  . ESOPHAGOGASTRODUODENOSCOPY (EGD) WITH PROPOFOL N/A 08/03/2013   Procedure: ESOPHAGOGASTRODUODENOSCOPY (EGD) WITH PROPOFOL;  Surgeon: Jeryl Columbia, MD;  [facility];  Service: Endoscopy;  Laterality: N/A;  . ESOPHAGOGASTRODUODENOSCOPY (EGD) WITH PROPOFOL N/A 07/10/2018   Procedure: ESOPHAGOGASTRODUODENOSCOPY (EGD) WITH PROPOFOL;  Surgeon: [name], MD;  Location: [facility];  Service: Gastroenterology;  Laterality: N/A;  . ICD IMPLANT  04/04/2019  . ICD IMPLANT N/A 04/04/2019   Procedure: ICD IMPLANT;  Surgeon: Constance Haw, MD;  Location: [facility] CV LAB;  Service: Cardiovascular;  Laterality: N/A;  . LEFT HEART CATHETERIZATION WITH CORONARY ANGIOGRAM N/A 10/07/2013   Procedure: LEFT HEART CATHETERIZATION WITH CORONARY ANGIOGRAM;  Surgeon: Leonie Man, MD;  Location: [facility] CATH LAB;  Service: Cardiovascular;  Laterality: N/A;  . RIGHT HEART CATH N/A 11/06/2018   Procedure: RIGHT HEART CATH;  Surgeon: Larey Dresser, MD;  Location: [facility] CV LAB;  Service: Cardiovascular;  Laterality: N/A;  . RIGHT/LEFT HEART CATH AND CORONARY ANGIOGRAPHY N/A 02/12/2019   Procedure: RIGHT/LEFT HEART CATH AND CORONARY ANGIOGRAPHY;  Surgeon: Larey Dresser, MD;  Location: [facility] CV  LAB;  Service: Cardiovascular;  Laterality: N/A;     OB History   No obstetric history on file.     Family History  Problem Relation Age of Onset  . Stroke Mother   . Hypertension Mother   . Colon cancer Father   . Diabetes Father   . Heart attack Father   . Sarcoidosis Other   . Breast cancer Paternal Aunt   . Emphysema Brother   . Lung disease Brother        Unknown type, 3 brothers, one with liver and lung disease  . Diabetes Brother   . Asthma Paternal Aunt   . Diabetes Sister     Social History   Tobacco Use  . Smoking status: Former Smoker    Packs/day: 0.50    Years: 20.00    Pack years: 10.00    Types: Cigarettes    Quit date: 05/31/2018    Years since quitting: 1.4  . Smokeless tobacco: Never Used  . Tobacco comment: smoked off and on x 20 years  Substance Use Topics  . Alcohol use: No    Alcohol/week: 0.0 standard drinks  . Drug use: Not Currently    Types: Cocaine    Home Medications Prior to Admission medications  Medication Sig Start Date End Date Taking? Authorizing Provider  acetaminophen (TYLENOL) 500 MG tablet Take 500 mg by mouth every 6 (six) hours as needed for moderate pain or headache.    [provider]  albuterol (PROAIR HFA) 108 (90 Base) MCG/ACT inhaler INHALE 2 PUFFS BY MOUTH 4 TIMES DAILY AS NEEDED 08/03/19   Parrett, Fonnie Mu, NP  aspirin EC 81 MG tablet Take 1 tablet (81 mg total) by mouth daily. 01/02/19   Clegg, Amy D, NP  bisoprolol (ZEBETA) 5 MG tablet Take 1 tablet (5 mg total) by mouth daily. 08/01/19   Larey Dresser, MD  Blood Glucose Monitoring Suppl (ACCU-CHEK GUIDE) w/Device KIT Use accu chek guide to check blood sugar three times daily. 10/09/19   Elayne Snare, MD  ferrous sulfate 325 (65 FE) MG tablet Take 1 tablet (325 mg total) by mouth daily after supper. Please take with a source of vitamin C 06/22/19   Orson Slick, MD  fluticasone (FLONASE) 50 MCG/ACT nasal spray PLACE 1 SPRAY INTO BOTH NOSTRILS DAILY. 09/27/19    Ladell Pier, MD  gabapentin (NEURONTIN) 300 MG capsule TAKE 1 CAPSULE (300 MG TOTAL) BY MOUTH 3 (THREE) TIMES DAILY. START WITH 1 CAPSULE AT BEDTIME FOR 7 DAYS THEN INCREASE TO 3 TIMES A DAY IF TOLERATED 08/27/19   Elayne Snare, MD  glucose blood (ACCU-CHEK GUIDE) test strip Use Accu Chek test strips as instructed to check blood sugar three times daily. 10/09/19   Elayne Snare, MD  hydrocerin (EUCERIN) CREA Apply 1 application topically 2 (two) times daily. 06/22/18   Love, Ivan Anchors, PA-C  insulin degludec (TRESIBA FLEXTOUCH) 200 UNIT/ML FlexTouch Pen Inject 80 units under the skin once daily. 10/23/19   Elayne Snare, MD  Insulin Pen Needle (EASY COMFORT PEN NEEDLES) 31G X 5 MM MISC USE 3 TIMES A DAY FOR INSULIN ADMINISTRATION 10/09/19   Elayne Snare, MD  ipratropium-albuterol (DUONEB) 0.5-2.5 (3) MG/3ML SOLN Use nebulizer to inhale contents of one vial every 6 hours as needed for shortness of breath, wheezing 08/03/19   Parrett, Tammy S, NP  isosorbide-hydrALAZINE (BIDIL) 20-37.5 MG tablet Take 1 tablet by mouth 3 (three) times daily. 10/15/19   Larey Dresser, MD  liraglutide (VICTOZA) 18 MG/3ML SOPN INJECT 1.8 MG INTO THE SKIN DAILY. 08/21/19   Ladell Pier, MD  NEEDLE, DISP, 26 G 26G X 1/2" MISC 1 application by Does not apply route daily. 06/22/18   Love, Ivan Anchors, PA-C  NOVOLOG FLEXPEN 100 UNIT/ML FlexPen Inject 20 units under the skin at breakfast and lunch, and 24 units at dinner. 10/23/19   Elayne Snare, MD  omeprazole (PRILOSEC) 40 MG capsule TAKE 1 CAPSULE (40 MG TOTAL) BY MOUTH DAILY. 04/09/19   Ladell Pier, MD  PAZEO 0.7 % SOLN Place 1 drop into both eyes daily.  12/08/18   [provider]  potassium chloride SA (KLOR-CON) 20 MEQ tablet Take 1 tablet (20 mEq total) by mouth daily. 10/31/19 11/30/19  Lyda Jester M, PA-C  RESTASIS 0.05 % ophthalmic emulsion Place 1 drop into both eyes daily as needed.  12/08/18   [provider]  rosuvastatin (CRESTOR) 40 MG tablet  TAKE 1 TABLET (40 MG TOTAL) BY MOUTH DAILY AT 6 PM. 10/26/19   Ladell Pier, MD  sacubitril-valsartan (ENTRESTO) 24-26 MG Take 1 tablet by mouth 2 (two) times daily. 06/19/19   Larey Dresser, MD  torsemide (DEMADEX) 20 MG tablet Take 5 tablets (100 mg total) by  mouth in the morning AND 4 tablets (80 mg total) every evening. 10/31/19   Lyda Jester M, PA-C  umeclidinium-vilanterol (ANORO ELLIPTA) 62.5-25 MCG/INH AEPB Inhale 1 puff into the lungs daily. 05/29/19   Laurin Coder, MD  vitamin B-12 (CYANOCOBALAMIN) 1000 MCG tablet Take 1,000 mcg by mouth daily.    [provider]  vitamin C (ASCORBIC ACID) 250 MG tablet Take 2 tablets (500 mg total) by mouth daily. 06/22/19   Orson Slick, MD    Allergies    Metolazone, Plavix [clopidogrel bisulfate], Ibuprofen, and Nsaids  Review of Systems   Review of Systems  Constitutional: Negative for fever.  Respiratory: Positive for shortness of breath.   Cardiovascular: Positive for leg swelling. Negative for chest pain.  Gastrointestinal: Negative for abdominal pain, constipation, diarrhea, nausea and vomiting.  Musculoskeletal: Negative for arthralgias and myalgias.  Skin: Negative for rash and wound.  Allergic/Immunologic: Positive for immunocompromised state.  Neurological: Negative for weakness.  Hematological: Negative for adenopathy.  Psychiatric/Behavioral: Negative for confusion.  All other systems reviewed and are negative.   Physical Exam Updated Vital Signs BP (!) 100/49 (BP Location: Right Arm)   Pulse (!) 57   Temp 98.6 F (37 C)   Resp (!) 21   Ht _0  (1.702 m)   Wt (!) 149.7 kg   LMP 07/28/2013 Comment: perimenopausal  SpO2 100%   BMI 51.69 kg/m   Physical Exam Vitals and nursing note reviewed.  Constitutional:      General: She is not in acute distress.    Appearance: She is well-developed. She is obese. She is not diaphoretic.  HENT:     Head: Normocephalic and atraumatic.    Cardiovascular:     Rate and Rhythm: Normal rate and regular rhythm.     Pulses: Normal pulses.     Heart sounds: Normal heart sounds.  Pulmonary:     Effort: Pulmonary effort is normal.     Breath sounds: Normal breath sounds.  Abdominal:     Palpations: Abdomen is soft.     Tenderness: There is no abdominal tenderness.  Musculoskeletal:        General: No tenderness.     Right lower leg: Edema present.     Left lower leg: Edema present.     Comments: Slight pitting edema to lower extremities  Skin:    General: Skin is warm and dry.     Findings: No erythema or rash.  Neurological:     Mental Status: She is alert and oriented to person, place, and time.  Psychiatric:        Behavior: Behavior normal.     ED Results / Procedures / Treatments   Labs (all labs ordered are listed, but only abnormal results are displayed) Labs Reviewed  BASIC METABOLIC PANEL - Abnormal; Notable for the following components:      Result Value   Glucose, Bld 142 (*)    BUN 81 (*)    Creatinine, Ser 2.32 (*)    GFR calc non Af Amer 22 (*)    GFR calc Af Amer 26 (*)    All other components within normal limits  CBC - Abnormal; Notable for the following components:   RBC 3.59 (*)    Hemoglobin 9.4 (*)    HCT 31.7 (*)    MCHC 29.7 (*)    All other components within normal limits  TROPONIN I (HIGH SENSITIVITY)  TROPONIN I (HIGH SENSITIVITY)    EKG EKG Interpretation  Date/Time:  Tuesday November 06 2019 07:32:48 EDT Ventricular Rate:  63 PR Interval:  152 QRS Duration: 88 QT Interval:  436 QTC Calculation: 446 R Axis:   61 Text Interpretation: Sinus rhythm with Premature atrial complexes Possible Inferior infarct , age undetermined Anterior infarct , age undetermined Abnormal ECG No acute changes No significant change since last tracing Confirmed by Varney Biles ([id number]) on 11/06/2019 1:14:28 PM   Radiology DG Chest 2 View  Result Date: 11/06/2019 CLINICAL DATA:  Shortness of breath  and bilateral leg swelling. History of heart failure. EXAM: CHEST - 2 VIEW COMPARISON:  08/14/2019 FINDINGS: The right ventricular pacer wire/AICD is in good position, unchanged. The heart is within normal limits in size. The mediastinal and hilar contours are within normal limits and stable. Chronic bronchitic and interstitial scarring type changes in the lungs but no definite infiltrates, edema or effusions. The bony thorax is intact. IMPRESSION: Chronic lung changes but no acute pulmonary findings. Electronically Signed   By: Marijo Sanes M.D.   On: 11/06/2019 08:32    Procedures Procedures (including critical care time)  Medications Ordered in ED Medications  sodium chloride flush (NS) 0.9 % injection 3 mL (has no administration in time range)  furosemide (LASIX) injection 80 mg (has no administration in time range)    ED Course  I have reviewed the triage vital signs and the nursing notes.  Pertinent labs & imaging results that were available during my care of the patient were reviewed by me and considered in my medical decision making (see chart for details).  Clinical Course as of Nov 05 1509  Tue Nov 06, 9022  [id number] 59 year old female with history as above, has lower extremity edema, difficulty to assess secondary to body habitus however does have very slight pitting.  CXR does not show volume overload. Troponin 7/10 without complaint of CP. BMP with Cr 2.32, slightly improved compared to last check on 10/23/19, CBC with hgb 9.4, not significant changed from prior and no complaints or dark/tarry stools. Case discussed with Dr. Shan Levans with [facility] at [facility] program who will review chart and return call.    [LM]  1344 Return call from Dr. Shan Levans, patient has used maximum number of days for the program but didn't follow up, unable to follow up with HF clinic.    [LM]  1449 Medtronic device interrogated, reportedly non concerning, not near reference line, impedence looks good,  reports optival with tiny increase but leveling.   [LM]  1509 Dr. Kathrynn Humble, ER attending spoke with Dr. Harrington Challenger with cardiology, reviewed labs and history. Dr. Harrington Challenger recommends Lasix IV in the ER today, return to 187m Torsemide BID and follow up in the clinic on Thursday. Reviewed plan of care with patient, given return to ER precautions.    [LM]    Clinical Course User Index [LM] MRoque Lias  MDM Rules/Calculators/A&P                      Final Clinical Impression(s) / ED Diagnoses Final diagnoses:  Peripheral edema    Rx / DC Orders ED Discharge Orders    None       MTacy Learn PA-C 11/06/19 1[place]an [place]un[place] MD 11/07/19 [phone]

## 2019-11-06 NOTE — ED Triage Notes (Signed)
Pt here from home via [facility] with c/c of bilateral leg swelling on chronic 3L [place]. Pt states the last time she began to have leg swelling like this she ended up in the heart failure due to fluid overload. Pt is alert and ox4, denies any new or worsening sob or CP.

## 2019-11-06 NOTE — ED Notes (Signed)
Pt ambulated to bathroom and back to room. Pt stayed 99%. Pt was on 3L [place] O2 while walking

## 2019-11-06 NOTE — ED Notes (Signed)
medtronic icd interrogated. Awaiting results

## 2019-11-06 NOTE — ED Notes (Signed)
Patient verbalizes understanding of discharge instructions. Opportunity for questioning and answers were provided. Armband removed by staff, pt discharged from ED by wheelchair with daughter to transport pt back home with oxygen

## 2019-11-06 NOTE — Discharge Instructions (Addendum)
Take Torsemide 100mg  tonight and continue with twice daily tomorrow. Follow up with Dr. Vennie Homans heart failure clinic on Thursday. Return to ER as needed.

## 2019-11-06 NOTE — ED Notes (Signed)
[facility] called to transport pt 

## 2019-11-07 ENCOUNTER — Telehealth: Payer: Self-pay | Admitting: Internal Medicine

## 2019-11-07 NOTE — Telephone Encounter (Signed)
Accessed patient's chart due to her calling to reschedule her labs that were requested by Dr. Harrington Challenger scheduled for 11/09/19. I advised Tamara Silva I cannot reschedule due to it being performed at the hospital and then transferred the call to the correct office.

## 2019-11-09 ENCOUNTER — Other Ambulatory Visit (HOSPITAL_COMMUNITY): Payer: Medicaid Other

## 2019-11-13 ENCOUNTER — Other Ambulatory Visit (HOSPITAL_COMMUNITY): Payer: Medicaid Other

## 2019-11-14 ENCOUNTER — Other Ambulatory Visit (HOSPITAL_COMMUNITY): Payer: Medicaid Other

## 2019-11-15 ENCOUNTER — Other Ambulatory Visit: Payer: Self-pay | Admitting: Internal Medicine

## 2019-11-15 DIAGNOSIS — K219 Gastro-esophageal reflux disease without esophagitis: Secondary | ICD-10-CM

## 2019-11-19 ENCOUNTER — Ambulatory Visit (HOSPITAL_COMMUNITY)
Admission: RE | Admit: 2019-11-19 | Discharge: 2019-11-19 | Disposition: A | Discharge status: Home / Self Care | Payer: Medicaid Other | Source: Ambulatory Visit | Attending: Internal Medicine | Admitting: Internal Medicine

## 2019-11-19 ENCOUNTER — Other Ambulatory Visit: Payer: Self-pay

## 2019-11-19 ENCOUNTER — Other Ambulatory Visit (HOSPITAL_COMMUNITY): Payer: Self-pay | Admitting: Cardiology

## 2019-11-19 DIAGNOSIS — I5022 Chronic systolic (congestive) heart failure: Secondary | ICD-10-CM | POA: Insufficient documentation

## 2019-11-19 LAB — BASIC METABOLIC PANEL
Anion gap: 10 (ref 5–15)
BUN: 65 mg/dL — ABNORMAL HIGH (ref 6–20)
CO2: 28 mmol/L (ref 22–32)
Calcium: 8.9 mg/dL (ref 8.9–10.3)
Chloride: 102 mmol/L (ref 98–111)
Creatinine, Ser: 2.45 mg/dL — ABNORMAL HIGH (ref 0.44–1.00)
GFR calc Af Amer: 24 mL/min — ABNORMAL LOW (ref >60)
GFR calc non Af Amer: 21 mL/min — ABNORMAL LOW (ref >60)
Glucose, Bld: 156 mg/dL — ABNORMAL HIGH (ref 70–99)
Potassium: 4.7 mmol/L (ref 3.5–5.1)
Sodium: 140 mmol/L (ref 135–145)

## 2019-11-20 ENCOUNTER — Encounter: Payer: Self-pay | Admitting: Internal Medicine

## 2019-11-22 ENCOUNTER — Other Ambulatory Visit: Payer: Self-pay

## 2019-11-22 ENCOUNTER — Encounter: Payer: Self-pay | Admitting: Internal Medicine

## 2019-11-22 ENCOUNTER — Ambulatory Visit: Payer: Medicaid Other | Attending: Internal Medicine | Admitting: Internal Medicine

## 2019-11-22 VITALS — BP 102/60 | HR 75 | Temp 97.0°F | Resp 16 | Wt 342.4 lb

## 2019-11-22 DIAGNOSIS — Z803 Family history of malignant neoplasm of breast: Secondary | ICD-10-CM | POA: Diagnosis not present

## 2019-11-22 DIAGNOSIS — Z800 Family history of malignant neoplasm of digestive organs: Secondary | ICD-10-CM | POA: Insufficient documentation

## 2019-11-22 DIAGNOSIS — J449 Chronic obstructive pulmonary disease, unspecified: Secondary | ICD-10-CM | POA: Diagnosis not present

## 2019-11-22 DIAGNOSIS — Z79899 Other long term (current) drug therapy: Secondary | ICD-10-CM | POA: Insufficient documentation

## 2019-11-22 DIAGNOSIS — Z8249 Family history of ischemic heart disease and other diseases of the circulatory system: Secondary | ICD-10-CM | POA: Insufficient documentation

## 2019-11-22 DIAGNOSIS — Z86711 Personal history of pulmonary embolism: Secondary | ICD-10-CM | POA: Insufficient documentation

## 2019-11-22 DIAGNOSIS — Z8659 Personal history of other mental and behavioral disorders: Secondary | ICD-10-CM

## 2019-11-22 DIAGNOSIS — K760 Fatty (change of) liver, not elsewhere classified: Secondary | ICD-10-CM | POA: Insufficient documentation

## 2019-11-22 DIAGNOSIS — Z7982 Long term (current) use of aspirin: Secondary | ICD-10-CM | POA: Insufficient documentation

## 2019-11-22 DIAGNOSIS — E785 Hyperlipidemia, unspecified: Secondary | ICD-10-CM | POA: Insufficient documentation

## 2019-11-22 DIAGNOSIS — I130 Hypertensive heart and chronic kidney disease with heart failure and stage 1 through stage 4 chronic kidney disease, or unspecified chronic kidney disease: Secondary | ICD-10-CM | POA: Diagnosis not present

## 2019-11-22 DIAGNOSIS — Z833 Family history of diabetes mellitus: Secondary | ICD-10-CM | POA: Diagnosis not present

## 2019-11-22 DIAGNOSIS — Z6841 Body Mass Index (BMI) 40.0 and over, adult: Secondary | ICD-10-CM | POA: Diagnosis not present

## 2019-11-22 DIAGNOSIS — G4733 Obstructive sleep apnea (adult) (pediatric): Secondary | ICD-10-CM

## 2019-11-22 DIAGNOSIS — Z794 Long term (current) use of insulin: Secondary | ICD-10-CM | POA: Diagnosis not present

## 2019-11-22 DIAGNOSIS — I5022 Chronic systolic (congestive) heart failure: Secondary | ICD-10-CM

## 2019-11-22 DIAGNOSIS — N184 Chronic kidney disease, stage 4 (severe): Secondary | ICD-10-CM | POA: Insufficient documentation

## 2019-11-22 DIAGNOSIS — Z87891 Personal history of nicotine dependence: Secondary | ICD-10-CM | POA: Diagnosis not present

## 2019-11-22 DIAGNOSIS — D509 Iron deficiency anemia, unspecified: Secondary | ICD-10-CM | POA: Insufficient documentation

## 2019-11-22 DIAGNOSIS — I25119 Atherosclerotic heart disease of native coronary artery with unspecified angina pectoris: Secondary | ICD-10-CM | POA: Insufficient documentation

## 2019-11-22 DIAGNOSIS — Z823 Family history of stroke: Secondary | ICD-10-CM | POA: Diagnosis not present

## 2019-11-22 DIAGNOSIS — I2510 Atherosclerotic heart disease of native coronary artery without angina pectoris: Secondary | ICD-10-CM | POA: Diagnosis not present

## 2019-11-22 DIAGNOSIS — Z955 Presence of coronary angioplasty implant and graft: Secondary | ICD-10-CM | POA: Insufficient documentation

## 2019-11-22 DIAGNOSIS — Z95810 Presence of automatic (implantable) cardiac defibrillator: Secondary | ICD-10-CM | POA: Diagnosis not present

## 2019-11-22 DIAGNOSIS — Z886 Allergy status to analgesic agent status: Secondary | ICD-10-CM | POA: Insufficient documentation

## 2019-11-22 DIAGNOSIS — F319 Bipolar disorder, unspecified: Secondary | ICD-10-CM | POA: Insufficient documentation

## 2019-11-22 DIAGNOSIS — Z825 Family history of asthma and other chronic lower respiratory diseases: Secondary | ICD-10-CM | POA: Insufficient documentation

## 2019-11-22 DIAGNOSIS — E662 Morbid (severe) obesity with alveolar hypoventilation: Secondary | ICD-10-CM | POA: Insufficient documentation

## 2019-11-22 DIAGNOSIS — J9611 Chronic respiratory failure with hypoxia: Secondary | ICD-10-CM | POA: Insufficient documentation

## 2019-11-22 DIAGNOSIS — E1165 Type 2 diabetes mellitus with hyperglycemia: Secondary | ICD-10-CM

## 2019-11-22 DIAGNOSIS — Z888 Allergy status to other drugs, medicaments and biological substances status: Secondary | ICD-10-CM | POA: Insufficient documentation

## 2019-11-22 DIAGNOSIS — E1122 Type 2 diabetes mellitus with diabetic chronic kidney disease: Secondary | ICD-10-CM | POA: Insufficient documentation

## 2019-11-22 DIAGNOSIS — E1142 Type 2 diabetes mellitus with diabetic polyneuropathy: Secondary | ICD-10-CM | POA: Insufficient documentation

## 2019-11-22 DIAGNOSIS — Z9981 Dependence on supplemental oxygen: Secondary | ICD-10-CM | POA: Insufficient documentation

## 2019-11-22 DIAGNOSIS — I5042 Chronic combined systolic (congestive) and diastolic (congestive) heart failure: Secondary | ICD-10-CM | POA: Diagnosis not present

## 2019-11-22 DIAGNOSIS — E119 Type 2 diabetes mellitus without complications: Secondary | ICD-10-CM | POA: Diagnosis present

## 2019-11-22 LAB — GLUCOSE, POCT (MANUAL RESULT ENTRY): POC Glucose: 150 mg/dl — AB (ref 70–99)

## 2019-11-22 MED ORDER — ANORO ELLIPTA 62.5-25 MCG/INH IN AEPB: 1.0000 | INHALATION_SPRAY | Freq: Every day | RESPIRATORY_TRACT | 5 refills | Status: DC

## 2019-11-22 NOTE — Progress Notes (Signed)
Patient ID: Tamara Silva, female    DOB: 03/28/1961  MRN: [id number]  CC: Diabetes and Hypertension   Subjective: Tamara Silva is a 59 y.o. female who presents for chronic ds management.  [name], boyfriend, is with her Her concerns today include:  HTN: Combined chronic CHF, cardiac arrest, CAD, CVA, chronic PE, OHS(noncompliant with BiPAP),COPD, hypoxic respiratory failure on home O23 Lt continuous,DM, obesity, CKD stage 3-4, formertob dep, IDA (history GI bleed with endoscopies2/2020- poor prep of colon on c-scope) Vit B12 def,  CHF/CAD/HTN: -Since I last spoke with her and 10/31/2019, patient seen in ER 11/06/2019 for increased lower extremity edema. Torsemide inc back to 100 mg BID.  On last discussion with me and cardiology PA, patient was told to hold spironolactone.  However she tells me that she had restarted spironolactone 2 days after our last conversation.  Had BMP yesterday for cardiology and this revealed creatinine still in the mid twos and GFR of 24.  2 months ago GFR was 41 with creatinine of 1.59.  I referred her to the nephrologist earlier this month and she awaits appointment Limits salt in foods. wgh up 12 lbs since ER visit Swelling in legs dec some. No increase SOB.  Sleeping on 2 pillows.  No PND.  Using O2 continuously Not using BiPAP consistently.  Using only 1-2 x a wk.  Feels like she can not breath and mouth gets real dry. She keeps O2 on via nasal cannula and puts the BiPAP mass over it.  She was told that she can connect the oxygen directly to the BiPAP machine so that it bleeds then.  Never had mass desensitization  CKD 4:  Referred to [facility] earlier this mth.  Pt states she called them and was told they will call her when they get to her name on referral list.  GFR bounces around depending on CHF status with use of diuretics  Hx of PE: was on Xarelto for 1 yr then it was discontinued.  No recurrence since then.  Hx of Bipolar: reports hx of  bipolar. She was trying to get in with [facility] for the past 2 months to get established with a mental health provider there.  She tells me she has spoken with them several times over the phone and she is Being told that they need further information before they can see her.   -depression in past but not currently -has intermittent conflicts with her daughter which affects her moods  DM:  Eating 2 meals a day and a snack inbetween.  Drinks mainly water Followed by Dr. Dwyane Dee whom she saw last month.  He increase Tresiba to 80 units daily and NovoLog was changed to 20 units with breakfast and lunch and 24 units with dinner.  She reports compliance with medications. Lab Results  Component Value Date   HGBA1C 7.4 (A) 10/22/2019    Patient Active Problem List   Diagnosis Date Noted  . Coronary artery disease involving native coronary artery of native heart with angina pectoris ([place]) 11/22/2019  . Chronic systolic (congestive) heart failure ([place]) 04/04/2019  . Diabetic peripheral neuropathy ([place]) 01/19/2019  . Former smoker 01/19/2019  . CKD (chronic kidney disease) stage 3, GFR 30-59 ml/min 01/19/2019  . Cardiac arrest ([place]) 10/28/2018  . Noncompliance with treatment plan 07/24/2018  . General patient noncompliance 07/24/2018  . Noncompliance with diet and medication regimen 07/24/2018  . Dysarthria   . BiPAP (biphasic positive airway pressure) dependence   . Type  II diabetes mellitus with renal manifestations ([place]) 07/08/2018  . Hepatic steatosis 06/22/2018  . Chronic respiratory failure with hypoxia and hypercapnia (HCC)   . On home O2   . Slow transit constipation   . Cocaine abuse ([place])   . Diabetes mellitus type 2 in obese ([place])   . Cerebral embolism with cerebral infarction 06/01/2018  . Pressure injury of skin 05/30/2018  . Iron deficiency anemia 01/27/2018  . Tobacco abuse disorder 10/03/2017  . Dyslipidemia 04/29/2016  . Primary insomnia 04/29/2016  . Obesity  hypoventilation syndrome ([place])   . OSA (obstructive sleep apnea)   . Dyslipidemia associated with type 2 diabetes mellitus ([place]) 05/21/2015  . COPD GOLD III  06/10/2014  . CAD S/P percutaneous coronary angioplasty - prior PCI to LAD; RCA PCI: new Xience Alpine DES 2.75 mm x 15 mm  10/07/2013  . Morbid obesity ([place]) 07/30/2013  . Chronic combined systolic and diastolic heart failure ([place]) 07/10/2013  . Bipolar disorder ([place]) 02/08/2013  . Essential hypertension 02/08/2013  . COPD (chronic obstructive pulmonary disease) ([place]) 10/15/2012     Current Outpatient Medications on File Prior to Visit  Medication Sig Dispense Refill  . acetaminophen (TYLENOL) 500 MG tablet Take 500 mg by mouth every 6 (six) hours as needed for moderate pain or headache.    . albuterol (PROAIR HFA) 108 (90 Base) MCG/ACT inhaler INHALE 2 PUFFS BY MOUTH 4 TIMES DAILY AS NEEDED 18 g 11  . aspirin EC 81 MG tablet Take 1 tablet (81 mg total) by mouth daily. 90 tablet 3  . bisoprolol (ZEBETA) 5 MG tablet Take 1 tablet (5 mg total) by mouth daily. 30 tablet 6  . Blood Glucose Monitoring Suppl (ACCU-CHEK GUIDE) w/Device KIT Use accu chek guide to check blood sugar three times daily. 1 kit 0  . ferrous sulfate 325 (65 FE) MG tablet Take 1 tablet (325 mg total) by mouth daily after supper. Please take with a source of vitamin C 90 tablet 3  . fluticasone (FLONASE) 50 MCG/ACT nasal spray PLACE 1 SPRAY INTO BOTH NOSTRILS DAILY. 16 g 1  . gabapentin (NEURONTIN) 300 MG capsule TAKE 1 CAPSULE (300 MG TOTAL) BY MOUTH 3 (THREE) TIMES DAILY. START WITH 1 CAPSULE AT BEDTIME FOR 7 DAYS THEN INCREASE TO 3 TIMES A DAY IF TOLERATED 90 capsule 3  . glucose blood (ACCU-CHEK GUIDE) test strip Use Accu Chek test strips as instructed to check blood sugar three times daily. 100 each 2  . hydrocerin (EUCERIN) CREA Apply 1 application topically 2 (two) times daily.  0  . insulin degludec (TRESIBA FLEXTOUCH) 200 UNIT/ML FlexTouch Pen Inject 80 units  under the skin once daily. 15 mL 2  . Insulin Pen Needle (EASY COMFORT PEN NEEDLES) 31G X 5 MM MISC USE 3 TIMES A DAY FOR INSULIN ADMINISTRATION 100 each 12  . ipratropium-albuterol (DUONEB) 0.5-2.5 (3) MG/3ML SOLN Use nebulizer to inhale contents of one vial every 6 hours as needed for shortness of breath, wheezing 360 mL 11  . isosorbide-hydrALAZINE (BIDIL) 20-37.5 MG tablet Take 1 tablet by mouth 3 (three) times daily. 270 tablet 3  . liraglutide (VICTOZA) 18 MG/3ML SOPN INJECT 1.8 MG INTO THE SKIN DAILY. 9 mL 2  . NEEDLE, DISP, 26 G 26G X 1/2" MISC 1 application by Does not apply route daily. 100 each 0  . NOVOLOG FLEXPEN 100 UNIT/ML FlexPen Inject 20 units under the skin at breakfast and lunch, and 24 units at dinner. 30 mL 4  . omeprazole (  PRILOSEC) 40 MG capsule TAKE 1 CAPSULE (40 MG TOTAL) BY MOUTH DAILY. 30 capsule 2  . PAZEO 0.7 % SOLN Place 1 drop into both eyes daily.     . potassium chloride SA (KLOR-CON) 20 MEQ tablet Take 1 tablet (20 mEq total) by mouth daily. 60 tablet 3  . RESTASIS 0.05 % ophthalmic emulsion Place 1 drop into both eyes daily as needed.     . rosuvastatin (CRESTOR) 40 MG tablet TAKE 1 TABLET (40 MG TOTAL) BY MOUTH DAILY AT 6 PM. 30 tablet 3  . sacubitril-valsartan (ENTRESTO) 24-26 MG Take 1 tablet by mouth 2 (two) times daily. 60 tablet 5  . torsemide (DEMADEX) 20 MG tablet Take 5 tablets (100 mg total) by mouth in the morning AND 4 tablets (80 mg total) every evening. 240 tablet 3  . umeclidinium-vilanterol (ANORO ELLIPTA) 62.5-25 MCG/INH AEPB Inhale 1 puff into the lungs daily. 21 each 0  . vitamin B-12 (CYANOCOBALAMIN) 1000 MCG tablet Take 1,000 mcg by mouth daily.    . vitamin C (ASCORBIC ACID) 250 MG tablet Take 2 tablets (500 mg total) by mouth daily. 90 tablet 3   No current facility-administered medications on file prior to visit.    Allergies  Allergen Reactions  . Metolazone Other (See Comments)    Dizziness and falling  . Plavix [Clopidogrel  Bisulfate] Other (See Comments)    High PRU's - non-responder  . Ibuprofen Other (See Comments)    Pt states she is not supposed to take ibuprofen because of other meds she is taking  . Nsaids Other (See Comments)    "I do not take NSAIDs d/t interfering with other meds"    Social History   Socioeconomic History  . Marital status: Single    Spouse name: Not on file  . Number of children: 1  . Years of education: Not on file  . Highest education level: Not on file  Occupational History  . Not on file  Tobacco Use  . Smoking status: Former Smoker    Packs/day: 0.50    Years: 20.00    Pack years: 10.00    Types: Cigarettes    Quit date: 05/31/2018    Years since quitting: 1.4  . Smokeless tobacco: Never Used  . Tobacco comment: smoked off and on x 20 years  Vaping Use  . Vaping Use: Never used  Substance and Sexual Activity  . Alcohol use: No    Alcohol/week: 0.0 standard drinks  . Drug use: Not Currently    Types: Cocaine  . Sexual activity: Not Currently  Other Topics Concern  . Not on file  Social History Narrative   Origibnally from [place]   Most recently from [place]   Daughter lives in town      On Social security to CHF, COPD, CAD   Social Determinants of Health   Financial Resource Strain:   . Difficulty of Paying Living Expenses:   Food Insecurity:   . Worried About Charity fundraiser in the Last Year:   . Arboriculturist in the Last Year:   Transportation Needs:   . Film/video editor (Medical):   [name] Kitchen Lack of Transportation (Non-Medical):   Physical Activity:   . Days of Exercise per Week:   . Minutes of Exercise per Session:   Stress:   . Feeling of Stress :   Social Connections:   . Frequency of Communication with Friends and Family:   . Frequency of Social Gatherings  with Friends and Family:   . Attends Religious Services:   . Active Member of Clubs or Organizations:   . Attends Archivist Meetings:   [name] Kitchen Marital Status:    Intimate Partner Violence:   . Fear of Current or Ex-Partner:   . Emotionally Abused:   [name] Kitchen Physically Abused:   . Sexually Abused:     Family History  Problem Relation Age of Onset  . Stroke Mother   . Hypertension Mother   . Colon cancer Father   . Diabetes Father   . Heart attack Father   . Sarcoidosis Other   . Breast cancer Paternal Aunt   . Emphysema Brother   . Lung disease Brother        Unknown type, 3 brothers, one with liver and lung disease  . Diabetes Brother   . Asthma Paternal Aunt   . Diabetes Sister     Past Surgical History:  Procedure Laterality Date  . BIOPSY  07/10/2018   Procedure: BIOPSY;  Surgeon: [name], MD;  Location: [facility];  Service: Gastroenterology;;  . COLONOSCOPY WITH PROPOFOL N/A 08/03/2013   Procedure: COLONOSCOPY WITH PROPOFOL;  Surgeon: Jeryl Columbia, MD;  Location: [facility];  Service: Endoscopy;  Laterality: N/A;  . COLONOSCOPY WITH PROPOFOL N/A 07/10/2018   Procedure: COLONOSCOPY WITH PROPOFOL;  Surgeon: [name], MD;  Location: [facility];  Service: Gastroenterology;  Laterality: N/A;  . CORONARY ANGIOPLASTY WITH STENT PLACEMENT     CAD in 2006 x 2 and 2009 2 more- place din REx in [place] [facility] [place] [facility]  . CORONARY ANGIOPLASTY WITH STENT PLACEMENT  10/07/2013   Xience Alpine DES 2.75  mm x 15  mm  . ESOPHAGOGASTRODUODENOSCOPY (EGD) WITH PROPOFOL N/A 08/03/2013   Procedure: ESOPHAGOGASTRODUODENOSCOPY (EGD) WITH PROPOFOL;  Surgeon: Jeryl Columbia, MD;  Location: [facility];  Service: Endoscopy;  Laterality: N/A;  . ESOPHAGOGASTRODUODENOSCOPY (EGD) WITH PROPOFOL N/A 07/10/2018   Procedure: ESOPHAGOGASTRODUODENOSCOPY (EGD) WITH PROPOFOL;  Surgeon: [name], MD;  Location: [facility];  Service: Gastroenterology;  Laterality: N/A;  . ICD IMPLANT  04/04/2019  . ICD IMPLANT N/A 04/04/2019   Procedure: ICD IMPLANT;  Surgeon: Constance Haw, MD;  Location: [facility];  Service: Cardiovascular;  Laterality: N/A;  .  LEFT HEART CATHETERIZATION WITH CORONARY ANGIOGRAM N/A 10/07/2013   Procedure: LEFT HEART CATHETERIZATION WITH CORONARY ANGIOGRAM;  Surgeon: Leonie Man, MD;  Location: [facility];  Service: Cardiovascular;  Laterality: N/A;  . RIGHT HEART CATH N/A 11/06/2018   Procedure: RIGHT HEART CATH;  Surgeon: Larey Dresser, MD;  Location: [facility];  Service: Cardiovascular;  Laterality: N/A;  . RIGHT/LEFT HEART CATH AND CORONARY ANGIOGRAPHY N/A 02/12/2019   Procedure: RIGHT/LEFT HEART CATH AND CORONARY ANGIOGRAPHY;  Surgeon: Larey Dresser, MD;  Location: [facility];  Service: Cardiovascular;  Laterality: N/A;    ROS: Review of Systems Negative except as stated above  PHYSICAL EXAM: BP 102/60   Pulse 75   Temp (!) 97 F (36.1 C)   Resp 16   Wt (!) 342 lb 6.4 oz (155.3 kg)   LMP 07/28/2013 Comment: perimenopausal  SpO2 96%   BMI 53.63 kg/m  Current O2 level is on 3 levels of oxygen via nasal cannula Wt Readings from Last 3 Encounters:  11/22/19 (!) 342 lb 6.4 oz (155.3 kg)  11/06/19 (!) 330 lb (149.7 kg)  10/23/19 (!) 337 lb (152.9 kg)    Physical Exam  General appearance -  alert, well appearing, obese older African-American female and in no distress.  She has her portable oxygen with her and is using it Mental status - normal mood, behavior, speech, dress, motor activity, and thought processes Mouth - mucous membranes moist, pharynx normal without lesions Neck - supple, no significant adenopathy Chest -breath sounds slightly decreased at the bases.  No crackles heard.   Heart - normal rate, regular rhythm, normal S1, S2, no murmurs, rubs, clicks or gallops Extremities -trace lower extremity edema  Diabetic Foot Exam - Simple   Simple Foot Form Visual Inspection No deformities, no ulcerations, no other skin breakdown bilaterally: Yes Sensation Testing See comments: Yes Pulse Check Posterior Tibialis and Dorsalis pulse intact bilaterally:  Yes Comments Decreased sensation on the sole of both feet      CMP Latest Ref Rng & Units 11/19/2019 11/06/2019 10/22/2019  Glucose 70 - 99 mg/dL 156(H) 142(H) 116(H)  BUN 6 - 20 mg/dL 65(H) 81(H) 77(H)  Creatinine 0.44 - 1.00 mg/dL 2.45(H) 2.32(H) 2.40(H)  Sodium 135 - 145 mmol/L 140 140 140  Potassium 3.5 - 5.1 mmol/L 4.7 4.9 4.7  Chloride 98 - 111 mmol/L 102 102 99  CO2 22 - 32 mmol/L 28 28 34(H)  Calcium 8.9 - 10.3 mg/dL 8.9 8.9 9.1  Total Protein 6.0 - 8.3 g/dL - - 8.0  Total Bilirubin 0.2 - 1.2 mg/dL - - 0.2  Alkaline Phos 39 - 117 U/L - - 96  AST 0 - 37 U/L - - 15  ALT 0 - 35 U/L - - 11   Lipid Panel     Component Value Date/Time   CHOL 150 10/22/2019 1553   TRIG 129.0 10/22/2019 1553   HDL 56.50 10/22/2019 1553   CHOLHDL 3 10/22/2019 1553   VLDL 25.8 10/22/2019 1553   LDLCALC 68 10/22/2019 1553    CBC    Component Value Date/Time   WBC 7.9 11/06/2019 0745   RBC 3.59 (L) 11/06/2019 0745   HGB 9.4 (L) 11/06/2019 0745   HGB 10.6 (L) 06/22/2019 0957   HGB 11.0 (L) 03/19/2019 1218   HCT 31.7 (L) 11/06/2019 0745   HCT 35.7 03/19/2019 1218   PLT 261 11/06/2019 0745   PLT 241 06/22/2019 0957   PLT 260 03/19/2019 1218   MCV 88.3 11/06/2019 0745   MCV 87 03/19/2019 1218   MCH 26.2 11/06/2019 0745   MCHC 29.7 (L) 11/06/2019 0745   RDW 14.9 11/06/2019 0745   RDW 12.7 03/19/2019 1218   LYMPHSABS 2.5 06/22/2019 0957   LYMPHSABS 3.4 (H) 11/30/2018 1346   MONOABS 0.8 06/22/2019 0957   EOSABS 0.2 06/22/2019 0957   EOSABS 0.2 11/30/2018 1346   BASOSABS 0.1 06/22/2019 0957   BASOSABS 0.1 11/30/2018 1346    ASSESSMENT AND PLAN: 1. Chronic systolic CHF (congestive heart failure) (HCC) Weight is up 12 pounds but she has trace lower extremity edema.  She will continue Entresto, isosorbide-hydralazine, torsemide 100 mg twice a day.  She is still taking spironolactone 12.5 mg daily.  I will touch base with her cardiologist to see whether he still wants her to be on the  spironolactone  2. Chronic respiratory failure with hypoxia (HCC) With history of CHF and COPD  3. CKD (chronic kidney disease) stage 4, GFR 15-29 ml/min (HCC) I will asked our referral coordinator to see if she can get her in with the nephrologist sooner rather than later.  We are walking a tight rope with her need for diuretics which subsequently affects her  creatinine  4. Type 2 diabetes mellitus with diabetic polyneuropathy, with long-term current use of insulin (HCC) Last A1c had improved.  She is followed by endocrinology.  She will continue current doses of her insulin - POCT glucose (manual entry)  5. Coronary artery disease involving native coronary artery of native heart without angina pectoris See #1 above  6. History of bipolar disorder - Ambulatory referral to Psychiatry  7. Morbid obesity ([place]) Discussed and encourage healthy eating habits  8. OSA treated with BiPAP We will try sending her for mask desensitization to see if it would help improve compliance with using her BiPAP. - Desensitization mask fit; Future     Patient was given the opportunity to ask questions.  Patient verbalized understanding of the plan and was able to repeat key elements of the plan.   Orders Placed This Encounter  Procedures  . Ambulatory referral to Psychiatry  . POCT glucose (manual entry)     Requested Prescriptions    No prescriptions requested or ordered in this encounter    Return in about 2 months (around 01/22/2020).  Karle Plumber, MD, FACP

## 2019-11-22 NOTE — Patient Instructions (Addendum)
Continue low-salt diet. I have referred you to a psychiatrist for the bipolar disorder. I will refer you to the sleep lab to try to get mask desensitization so that you can use your BiPAP machine.

## 2019-11-27 ENCOUNTER — Other Ambulatory Visit: Payer: Self-pay | Admitting: Family Medicine

## 2019-11-28 ENCOUNTER — Other Ambulatory Visit: Payer: Self-pay | Admitting: Cardiology

## 2019-12-04 ENCOUNTER — Other Ambulatory Visit: Payer: Self-pay | Admitting: Internal Medicine

## 2019-12-04 DIAGNOSIS — IMO0002 Reserved for concepts with insufficient information to code with codable children: Secondary | ICD-10-CM

## 2019-12-04 DIAGNOSIS — E1142 Type 2 diabetes mellitus with diabetic polyneuropathy: Secondary | ICD-10-CM

## 2019-12-07 ENCOUNTER — Ambulatory Visit: Payer: Medicaid Other | Admitting: Endocrinology

## 2019-12-10 ENCOUNTER — Other Ambulatory Visit: Payer: Self-pay | Admitting: Endocrinology

## 2019-12-15 ENCOUNTER — Other Ambulatory Visit (HOSPITAL_COMMUNITY): Payer: Self-pay | Admitting: Cardiology

## 2019-12-15 ENCOUNTER — Other Ambulatory Visit: Payer: Self-pay | Admitting: Endocrinology

## 2019-12-17 ENCOUNTER — Other Ambulatory Visit: Payer: Self-pay

## 2019-12-17 ENCOUNTER — Other Ambulatory Visit: Payer: Self-pay | Admitting: Physical Medicine and Rehabilitation

## 2019-12-17 DIAGNOSIS — J449 Chronic obstructive pulmonary disease, unspecified: Secondary | ICD-10-CM

## 2019-12-19 ENCOUNTER — Other Ambulatory Visit: Payer: Self-pay | Admitting: Hematology and Oncology

## 2019-12-19 DIAGNOSIS — D500 Iron deficiency anemia secondary to blood loss (chronic): Secondary | ICD-10-CM

## 2019-12-19 DIAGNOSIS — D509 Iron deficiency anemia, unspecified: Secondary | ICD-10-CM

## 2019-12-19 NOTE — Progress Notes (Signed)
[facility] [phone]   Fax:([phone]) [phone]  PROGRESS NOTE  Patient Care Team: Ladell Pier, MD as PCP - General (Internal Medicine) Larey Dresser, MD as PCP - Advanced Heart Failure (Cardiology) Constance Haw, MD as PCP - Electrophysiology (Cardiology) Sueanne Margarita, MD as PCP - Cardiology (Cardiology)  Hematological/Oncological History #Iron Deficiency Anemia/Vitamin B12 deficiency 1) 07/08/2018: admitted for symptomatic anemia, found to have Hgb 5.8  2/ 07/10/2018: WBC 7.9 HB 8.8 plts 347,000. EGD and colonoscopy showed diverticulosis and normal esophagus. No active bleeding identified.  3) 08/18/2018: WBC 6 HB 9.6 plts 220,000. MMA 583.  4) 03/19/2019: WBC 8.1, Hgb 11.0, Plt 260 5) 06/22/2019: Establish care with Dr. Lorenso Courier   #Polyclonal Gammopathy 1) 08/18/2018: SPEP showed no monoclonal protein.  FLC ratio stable at 2.23.  Interval History:  Caydee Talkington 59 y.o. female with medical history significant for iron deficiency anemia, B12 deficiency, and polyclonal gammopathy presents for a follow up visit. The patient's last visit was on 06/22/2019 at which time she established care. In the interim since the last visit Ms. Plotner   On examination today the patient reports that she is at baseline.  She is having no increasing in shortness of breath or chest pain at this time.  She denies any overt sources of bleeding and reports no nosebleeds, bruising, or dark stools.  She also notes that she underwent menopause 5 years ago and has had no evidence of menstrual bleeding or spotting.  She notes that she is markedly inactive due to her oxygen requirement and increased weight.  She denies having any fevers, chills, sweats, nausea, vomiting or diarrhea.  She reports that she has no other questions or complaints today.  A 10 point ROS is listed below.  MEDICAL HISTORY:  Past Medical History:  Diagnosis Date  . AICD (automatic  cardioverter/defibrillator) present 04/04/2019  . Asthma   . CAD (coronary artery disease)   . Chronic combined systolic (congestive) and diastolic (congestive) heart failure ([place])   . Chronic iron deficiency anemia   . CVA (cerebral vascular accident) ([place]) 05/2018  . Diabetes mellitus without complication ([place])   . DOE (dyspnea on exertion)   . Edema of both legs   . Hepatic steatosis   . Hypertension   . Pulmonary emboli ([place]) 2014  . Respiratory failure ([place]) 09/2018  . Seizure ([place]) 05/2018    SURGICAL HISTORY: Past Surgical History:  Procedure Laterality Date  . BIOPSY  07/10/2018   Procedure: BIOPSY;  Surgeon: [name], MD;  Location: [facility];  Service: Gastroenterology;;  . COLONOSCOPY WITH PROPOFOL N/A 08/03/2013   Procedure: COLONOSCOPY WITH PROPOFOL;  Surgeon: Jeryl Columbia, MD;  Location: [facility];  Service: Endoscopy;  Laterality: N/A;  . COLONOSCOPY WITH PROPOFOL N/A 07/10/2018   Procedure: COLONOSCOPY WITH PROPOFOL;  Surgeon: [name], MD;  Location: [facility];  Service: Gastroenterology;  Laterality: N/A;  . CORONARY ANGIOPLASTY WITH STENT PLACEMENT     CAD in 2006 x 2 and 2009 2 more- place din REx in [place] and  med  . CORONARY ANGIOPLASTY WITH STENT PLACEMENT  10/07/2013   Xience [place] DES 2.75  mm x 15  mm  . ESOPHAGOGASTRODUODENOSCOPY (EGD) WITH PROPOFOL N/A 08/03/2013   Procedure: ESOPHAGOGASTRODUODENOSCOPY (EGD) WITH PROPOFOL;  Surgeon: Jeryl Columbia, MD;  [facility]: [facility];  Service: Endoscopy;  Laterality: N/A;  . ESOPHAGOGASTRODUODENOSCOPY (EGD) WITH PROPOFOL N/A 07/10/2018   Procedure: ESOPHAGOGASTRODUODENOSCOPY (EGD) WITH PROPOFOL;  Surgeon: [name], MD;  [facility];  Service: Gastroenterology;  Laterality: N/A;  . ICD IMPLANT  04/04/2019  . ICD IMPLANT N/A 04/04/2019   Procedure: ICD IMPLANT;  Surgeon: Constance Haw, MD;  Location: [facility];  Service: Cardiovascular;  Laterality: N/A;  . LEFT HEART  CATHETERIZATION WITH CORONARY ANGIOGRAM N/A 10/07/2013   Procedure: LEFT HEART CATHETERIZATION WITH CORONARY ANGIOGRAM;  Surgeon: Leonie Man, MD;  Location: [facility];  Service: Cardiovascular;  Laterality: N/A;  . RIGHT HEART CATH N/A 11/06/2018   Procedure: RIGHT HEART CATH;  Surgeon: Larey Dresser, MD;  Location: [facility];  Service: Cardiovascular;  Laterality: N/A;  . RIGHT/LEFT HEART CATH AND CORONARY ANGIOGRAPHY N/A 02/12/2019   Procedure: RIGHT/LEFT HEART CATH AND CORONARY ANGIOGRAPHY;  Surgeon: Larey Dresser, MD;  Location: [facility];  Service: Cardiovascular;  Laterality: N/A;    SOCIAL HISTORY: Social History   Socioeconomic History  . Marital status: Single    Spouse name: Not on file  . Number of children: 1  . Years of education: Not on file  . Highest education level: Not on file  Occupational History  . Not on file  Tobacco Use  . Smoking status: Former Smoker    Packs/day: 0.50    Years: 20.00    Pack years: 10.00    Types: Cigarettes    Quit date: 05/31/2018    Years since quitting: 1.5  . Smokeless tobacco: Never Used  . Tobacco comment: smoked off and on x 20 years  Vaping Use  . Vaping Use: Never used  Substance and Sexual Activity  . Alcohol use: No    Alcohol/week: 0.0 standard drinks  . Drug use: Not Currently    Types: Cocaine  . Sexual activity: Not Currently  Other Topics Concern  . Not on file  Social History Narrative   Origibnally from [place]   Most recently from [place]   Daughter lives in town      On Social security to CHF, COPD, CAD   Social Determinants of Health   Financial Resource Strain:   . Difficulty of Paying Living Expenses:   Food Insecurity:   . Worried About Charity fundraiser in the Last Year:   . Arboriculturist in the Last Year:   Transportation Needs:   . Film/video editor (Medical):   [name] Kitchen Lack of Transportation (Non-Medical):   Physical Activity:   . Days of Exercise per Week:    . Minutes of Exercise per Session:   Stress:   . Feeling of Stress :   Social Connections:   . Frequency of Communication with Friends and Family:   . Frequency of Social Gatherings with Friends and Family:   . Attends Religious Services:   . Active Member of Clubs or Organizations:   . Attends Archivist Meetings:   [name] Kitchen Marital Status:   Intimate Partner Violence:   . Fear of Current or Ex-Partner:   . Emotionally Abused:   [name] Kitchen Physically Abused:   . Sexually Abused:     FAMILY HISTORY: Family History  Problem Relation Age of Onset  . Stroke Mother   . Hypertension Mother   . Colon cancer Father   . Diabetes Father   . Heart attack Father   . Sarcoidosis Other   . Breast cancer Paternal Aunt   . Emphysema Brother   . Lung disease Brother        Unknown type, 3 brothers,  one with liver and lung disease  . Diabetes Brother   . Asthma Paternal Aunt   . Diabetes Sister     ALLERGIES:  is allergic to metolazone, plavix [clopidogrel bisulfate], ibuprofen, and nsaids.  MEDICATIONS:  Current Outpatient Medications  Medication Sig Dispense Refill  . acetaminophen (TYLENOL) 500 MG tablet Take 500 mg by mouth every 6 (six) hours as needed for moderate pain or headache.    . albuterol (PROAIR HFA) 108 (90 Base) MCG/ACT inhaler INHALE 2 PUFFS BY MOUTH 4 TIMES DAILY AS NEEDED 18 g 0  . ASPIRIN LOW DOSE 81 MG EC tablet CHEW 1 TABLET (81 MG TOTAL) BY MOUTH DAILY. 90 tablet 3  . bisoprolol (ZEBETA) 5 MG tablet Take 1 tablet (5 mg total) by mouth daily. 30 tablet 6  . Blood Glucose Monitoring Suppl (ACCU-CHEK GUIDE) w/Device KIT Use accu chek guide to check blood sugar three times daily. 1 kit 0  . ferrous sulfate 325 (65 FE) MG tablet Take 1 tablet (325 mg total) by mouth daily after supper. Please take with a source of vitamin C 90 tablet 3  . fluticasone (FLONASE) 50 MCG/ACT nasal spray PLACE 1 SPRAY INTO BOTH NOSTRILS DAILY. 16 g 1  . gabapentin (NEURONTIN) 300 MG capsule  TAKE 1 CAPSULE (300 MG TOTAL) BY MOUTH 3 (THREE) TIMES DAILY. START WITH 1 CAPSULE AT BEDTIME FOR 7 DAYS THEN INCREASE TO 3 TIMES A DAY IF TOLERATED 90 capsule 3  . glucose blood (ACCU-CHEK GUIDE) test strip USE AS INSTRUCTED TO CHECK BLOOD SUGAR THREE TIMES DAILY. 100 each 2  . hydrocerin (EUCERIN) CREA Apply 1 application topically 2 (two) times daily.  0  . insulin degludec (TRESIBA FLEXTOUCH) 200 UNIT/ML FlexTouch Pen Inject 80 units under the skin once daily. 15 mL 2  . Insulin Pen Needle (EASY COMFORT PEN NEEDLES) 31G X 5 MM MISC USE 3 TIMES A DAY FOR INSULIN ADMINISTRATION 100 each 12  . ipratropium-albuterol (DUONEB) 0.5-2.5 (3) MG/3ML SOLN Use nebulizer to inhale contents of one vial every 6 hours as needed for shortness of breath, wheezing 360 mL 11  . isosorbide-hydrALAZINE (BIDIL) 20-37.5 MG tablet Take 1 tablet by mouth 3 (three) times daily. 270 tablet 3  . NEEDLE, DISP, 26 G 26G X 1/2" MISC 1 application by Does not apply route daily. 100 each 0  . NOVOLOG FLEXPEN 100 UNIT/ML FlexPen Inject 20 units under the skin at breakfast and lunch, and 24 units at dinner. 30 mL 4  . omeprazole (PRILOSEC) 40 MG capsule TAKE 1 CAPSULE (40 MG TOTAL) BY MOUTH DAILY. 30 capsule 2  . PAZEO 0.7 % SOLN Place 1 drop into both eyes daily.     . potassium chloride SA (KLOR-CON) 20 MEQ tablet TAKE 2 TABLETS (40 MEQ TOTAL) BY MOUTH DAILY. 90 tablet 0  . RESTASIS 0.05 % ophthalmic emulsion Place 1 drop into both eyes daily as needed.     . rosuvastatin (CRESTOR) 40 MG tablet TAKE 1 TABLET (40 MG TOTAL) BY MOUTH DAILY AT 6 PM. 30 tablet 3  . sacubitril-valsartan (ENTRESTO) 24-26 MG Take 1 tablet by mouth 2 (two) times daily. 60 tablet 5  . torsemide (DEMADEX) 20 MG tablet Take 5 tablets (100 mg total) by mouth in the morning AND 4 tablets (80 mg total) every evening. 240 tablet 3  . umeclidinium-vilanterol (ANORO ELLIPTA) 62.5-25 MCG/INH AEPB Inhale 1 puff into the lungs daily. 30 each 5  . VICTOZA 18 MG/3ML  SOPN INJECT 1.8 MG INTO THE  SKIN DAILY. 9 mL 2  . vitamin B-12 (CYANOCOBALAMIN) 1000 MCG tablet Take 1,000 mcg by mouth daily.    . vitamin C (ASCORBIC ACID) 250 MG tablet Take 2 tablets (500 mg total) by mouth daily. 90 tablet 3   No current facility-administered medications for this visit.    REVIEW OF SYSTEMS:   Constitutional: ( - ) fevers, ( - )  chills , ( - ) night sweats Eyes: ( - ) blurriness of vision, ( - ) double vision, ( - ) watery eyes Ears, nose, mouth, throat, and face: ( - ) mucositis, ( - ) sore throat Respiratory: ( - ) cough, ( + ) dyspnea, ( - ) wheezes Cardiovascular: ( - ) palpitation, ( - ) chest discomfort, ( - ) lower extremity swelling Gastrointestinal:  ( - ) nausea, ( - ) heartburn, ( - ) change in bowel habits Skin: ( - ) abnormal skin rashes Lymphatics: ( - ) new lymphadenopathy, ( - ) easy bruising Neurological: ( - ) numbness, ( - ) tingling, ( - ) new weaknesses Behavioral/Psych: ( - ) mood change, ( - ) new changes  All other systems were reviewed with the patient and are negative.  PHYSICAL EXAMINATION: ECOG PERFORMANCE STATUS: 3 - Symptomatic, >50% confined to bed  Vitals:   12/20/19 0848  BP: (!) 110/48  Pulse: 70  Resp: 20  Temp: 97.7 F (36.5 C)  SpO2: 96%   Filed Weights   12/20/19 0848  Weight: (!) 345 lb 3.2 oz (156.6 kg)    GENERAL: chronically ill appearing middle aged obese African American female in NAD  SKIN: skin color, texture, turgor are normal, no rashes or significant lesions EYES: conjunctiva are pink and non-injected, sclera clear LUNGS: clear to auscultation and percussion with normal breathing effort. Diminished breath sounds bilaterally HEART: regular rate & rhythm and no murmurs and no lower extremity edema Musculoskeletal: no cyanosis of digits and no clubbing  PSYCH: alert & oriented x 3, fluent speech NEURO: no focal motor/sensory deficits  LABORATORY DATA:  I have reviewed the data as listed CBC Latest Ref  Rng & Units 12/20/2019 11/06/2019 06/22/2019  WBC 4.0 - 10.5 K/uL 8.5 7.9 8.2  Hemoglobin 12.0 - 15.0 g/dL 9.7(L) 9.4(L) 10.6(L)  Hematocrit 36 - 46 % 31.9(L) 31.7(L) 34.3(L)  Platelets 150 - 400 K/uL 255 261 241    CMP Latest Ref Rng & Units 12/20/2019 11/19/2019 11/06/2019  Glucose 70 - 99 mg/dL 120(H) 156(H) 142(H)  BUN 6 - 20 mg/dL 60(H) 65(H) 81(H)  Creatinine 0.44 - 1.00 mg/dL 2.34(H) 2.45(H) 2.32(H)  Sodium 135 - 145 mmol/L 141 140 140  Potassium 3.5 - 5.1 mmol/L 4.4 4.7 4.9  Chloride 98 - 111 mmol/L 99 102 102  CO2 22 - 32 mmol/L 29 28 28   Calcium 8.9 - 10.3 mg/dL 9.8 8.9 8.9  Total Protein 6.5 - 8.1 g/dL 8.4(H) - -  Total Bilirubin 0.3 - 1.2 mg/dL <0.2(L) - -  Alkaline Phos 38 - 126 U/L 86 - -  AST 15 - 41 U/L 16 - -  ALT 0 - 44 U/L 12 - -   Iron/TIBC/Ferritin/ %Sat    Component Value Date/Time   IRON 34 (L) 12/20/2019 0810   TIBC 308 12/20/2019 0810   FERRITIN 176 12/20/2019 0810   IRONPCTSAT 11 (L) 12/20/2019 0810     RADIOGRAPHIC STUDIES: None to review:  No results found.  ASSESSMENT & PLAN Tamara Silva 59 y.o. female with medical history significant for  iron deficiency anemia, B12 deficiency, and polyclonal gammopathy presents for a follow up visit.  After review of the labs discussion with the patient her findings are most consistent with a chronic anemia, likely multifactorial.  I believe that there is some component of iron deficiency contributing to her anemia, with her iron sat of 16%.  Her ferritin is falsely elevated in the setting of chronic inflammation.  Also given her worsening kidney function it is likely that stimulus to produce new red blood cells is markedly diminished.  Given this I do believe that her baseline hemoglobin may be now approximately 10 and will not likely improve with increased nutritional supplementation.  As such I do not believe that she is currently a candidate for IV iron therapy.  I do believe we can continue with p.o. iron therapy at  this time and rechecking her vitamin B12 labs to assure that she has adequate supplementation.  Given these findings I think the best possible course forward to be to optimize her kidney function with improved glycemic and blood pressure control.  We will have her return to clinic in approximately 6 months time to reevaluate her iron level and determine if continued therapy is required.  Additionally her polyclonal gammopathy can be best explained once again by her worsening renal function.  There is no monoclonal component and therefore she does not meet the diagnosis of MGUS.  As such I think we can monitor this clinically for any aberrations in the free light chain ratio, though a ratio of 2.23 in the setting of CKD can be considered within normal limits ([facility] 21, 111 (2020).   #Iron Deficiency Anemia/Vitamin B12 deficiency --patient found to have Hgb 9.7 today. Likely represents a new baseline given her kidney function. Will repeat iron studies and vitamin b12 labs today. Will order EPO to assure this is a result of decrease message to bone marrow.  --patient notes she has not established with nephrology. Will place outpatient consult to [facility].  --continue to take iron 389m PO daily with a source of vitamin C. Patient has elevated iron, but also has elevated inflammatory markers. Would use iron sat % as surrogate for total body iron levels --encourage good glycemic and blood pressure control for kidney function  --continue to monitor. F/u in 6 months time   #Polyclonal Gammopathy --noted to have a polyclonal gammopathy on labs from 08/18/2018, no monoclonal component. -- Last K/L ratio found to be 2.23, appropriately elevated for level of kidney dysfunction.  --continue to monitor  Orders Placed This Encounter  Procedures  . Ambulatory referral to Nephrology    Referral Priority:   Routine    Referral Type:   Consultation    Referral Reason:   Specialty Services  Required    Requested Specialty:   Nephrology    Number of Visits Requested:   1   All questions were answered. The patient knows to call the clinic with any problems, questions or concerns.  A total of more than 25 minutes were spent on this encounter and over half of that time was spent on counseling and coordination of care as outlined above.   JLedell Peoples MD Department of Hematology/Oncology [place] LLCPhone: [phone]: [phone]: [email or web address]  12/21/2019 7:53 PM   Literature Support:  MAlycia Rossetti A., Robles, P., Cibeira, M.T. et al. The renal range of the / sFLC ratio: best strategy to evaluate multiple myeloma in patients with chronic  kidney disease. [place] [facility] 21, 111 (2020).   -- This study confirms the influence of eGFR on the interpretation of the sFLC ratio, showing a decreasing specificity in progressive CKD stages when using the reference sFLC range (Katzmann), especially in patients with eFGR ?55. According to our results, we suggest a modified optimal range (0.82-3,6) for eGFR ?55?ml/min/1.73?m2.

## 2019-12-20 ENCOUNTER — Encounter: Payer: Self-pay | Admitting: General Practice

## 2019-12-20 ENCOUNTER — Inpatient Hospital Stay: Payer: Medicaid Other | Attending: Hematology and Oncology | Admitting: Hematology and Oncology

## 2019-12-20 ENCOUNTER — Telehealth (HOSPITAL_COMMUNITY): Payer: Self-pay | Admitting: Vascular Surgery

## 2019-12-20 ENCOUNTER — Other Ambulatory Visit (HOSPITAL_COMMUNITY): Payer: Self-pay | Admitting: Cardiology

## 2019-12-20 ENCOUNTER — Other Ambulatory Visit: Payer: Self-pay

## 2019-12-20 ENCOUNTER — Inpatient Hospital Stay: Payer: Medicaid Other

## 2019-12-20 VITALS — BP 110/48 | HR 70 | Temp 97.7°F | Resp 20 | Ht 67.0 in | Wt 345.2 lb

## 2019-12-20 DIAGNOSIS — K760 Fatty (change of) liver, not elsewhere classified: Secondary | ICD-10-CM | POA: Diagnosis not present

## 2019-12-20 DIAGNOSIS — N289 Disorder of kidney and ureter, unspecified: Secondary | ICD-10-CM | POA: Diagnosis not present

## 2019-12-20 DIAGNOSIS — D890 Polyclonal hypergammaglobulinemia: Secondary | ICD-10-CM | POA: Insufficient documentation

## 2019-12-20 DIAGNOSIS — I110 Hypertensive heart disease with heart failure: Secondary | ICD-10-CM | POA: Insufficient documentation

## 2019-12-20 DIAGNOSIS — E538 Deficiency of other specified B group vitamins: Secondary | ICD-10-CM | POA: Insufficient documentation

## 2019-12-20 DIAGNOSIS — Z87891 Personal history of nicotine dependence: Secondary | ICD-10-CM | POA: Diagnosis not present

## 2019-12-20 DIAGNOSIS — Z79899 Other long term (current) drug therapy: Secondary | ICD-10-CM | POA: Diagnosis not present

## 2019-12-20 DIAGNOSIS — Z86711 Personal history of pulmonary embolism: Secondary | ICD-10-CM | POA: Diagnosis not present

## 2019-12-20 DIAGNOSIS — D509 Iron deficiency anemia, unspecified: Secondary | ICD-10-CM | POA: Insufficient documentation

## 2019-12-20 DIAGNOSIS — Z95810 Presence of automatic (implantable) cardiac defibrillator: Secondary | ICD-10-CM | POA: Insufficient documentation

## 2019-12-20 DIAGNOSIS — E119 Type 2 diabetes mellitus without complications: Secondary | ICD-10-CM | POA: Diagnosis not present

## 2019-12-20 DIAGNOSIS — Z8673 Personal history of transient ischemic attack (TIA), and cerebral infarction without residual deficits: Secondary | ICD-10-CM | POA: Diagnosis not present

## 2019-12-20 DIAGNOSIS — I5042 Chronic combined systolic (congestive) and diastolic (congestive) heart failure: Secondary | ICD-10-CM | POA: Diagnosis not present

## 2019-12-20 DIAGNOSIS — I2510 Atherosclerotic heart disease of native coronary artery without angina pectoris: Secondary | ICD-10-CM | POA: Insufficient documentation

## 2019-12-20 DIAGNOSIS — Z7982 Long term (current) use of aspirin: Secondary | ICD-10-CM | POA: Diagnosis not present

## 2019-12-20 DIAGNOSIS — Z794 Long term (current) use of insulin: Secondary | ICD-10-CM | POA: Diagnosis not present

## 2019-12-20 DIAGNOSIS — D500 Iron deficiency anemia secondary to blood loss (chronic): Secondary | ICD-10-CM | POA: Diagnosis not present

## 2019-12-20 LAB — CMP (CANCER CENTER ONLY)
ALT: 12 U/L (ref 0–44)
AST: 16 U/L (ref 15–41)
Albumin: 3.3 g/dL — ABNORMAL LOW (ref 3.5–5.0)
Alkaline Phosphatase: 86 U/L (ref 38–126)
Anion gap: 13 (ref 5–15)
BUN: 60 mg/dL — ABNORMAL HIGH (ref 6–20)
CO2: 29 mmol/L (ref 22–32)
Calcium: 9.8 mg/dL (ref 8.9–10.3)
Chloride: 99 mmol/L (ref 98–111)
Creatinine: 2.34 mg/dL — ABNORMAL HIGH (ref 0.44–1.00)
GFR, Est AFR Am: 25 mL/min — ABNORMAL LOW (ref >60)
GFR, Estimated: 22 mL/min — ABNORMAL LOW (ref >60)
Glucose, Bld: 120 mg/dL — ABNORMAL HIGH (ref 70–99)
Potassium: 4.4 mmol/L (ref 3.5–5.1)
Sodium: 141 mmol/L (ref 135–145)
Total Bilirubin: <0.2 mg/dL — ABNORMAL LOW (ref 0.3–1.2)
Total Protein: 8.4 g/dL — ABNORMAL HIGH (ref 6.5–8.1)

## 2019-12-20 LAB — CBC WITH DIFFERENTIAL (CANCER CENTER ONLY)
Abs Immature Granulocytes: 0.02 10*3/uL (ref 0.00–0.07)
Basophils Absolute: 0 10*3/uL (ref 0.0–0.1)
Basophils Relative: 1 %
Eosinophils Absolute: 0.3 10*3/uL (ref 0.0–0.5)
Eosinophils Relative: 3 %
HCT: 31.9 % — ABNORMAL LOW (ref 36.0–46.0)
Hemoglobin: 9.7 g/dL — ABNORMAL LOW (ref 12.0–15.0)
Immature Granulocytes: 0 %
Lymphocytes Relative: 24 %
Lymphs Abs: 2 10*3/uL (ref 0.7–4.0)
MCH: 25.7 pg — ABNORMAL LOW (ref 26.0–34.0)
MCHC: 30.4 g/dL (ref 30.0–36.0)
MCV: 84.6 fL (ref 80.0–100.0)
Monocytes Absolute: 0.6 10*3/uL (ref 0.1–1.0)
Monocytes Relative: 7 %
Neutro Abs: 5.6 10*3/uL (ref 1.7–7.7)
Neutrophils Relative %: 65 %
Platelet Count: 255 10*3/uL (ref 150–400)
RBC: 3.77 MIL/uL — ABNORMAL LOW (ref 3.87–5.11)
RDW: 15.3 % (ref 11.5–15.5)
WBC Count: 8.5 10*3/uL (ref 4.0–10.5)
nRBC: 0 % (ref 0.0–0.2)

## 2019-12-20 LAB — RETIC PANEL
Immature Retic Fract: 12.2 % (ref 2.3–15.9)
RBC.: 3.73 MIL/uL — ABNORMAL LOW (ref 3.87–5.11)
Retic Count, Absolute: 54.8 10*3/uL (ref 19.0–186.0)
Retic Ct Pct: 1.5 % (ref 0.4–3.1)
Reticulocyte Hemoglobin: 28.5 pg (ref >27.9)

## 2019-12-20 LAB — FERRITIN: Ferritin: 176 ng/mL (ref 11–307)

## 2019-12-20 LAB — IRON AND TIBC
Iron: 34 ug/dL — ABNORMAL LOW (ref 41–142)
Saturation Ratios: 11 % — ABNORMAL LOW (ref 21–57)
TIBC: 308 ug/dL (ref 236–444)
UIBC: 274 ug/dL (ref 120–384)

## 2019-12-20 LAB — VITAMIN B12: Vitamin B-12: 1389 pg/mL — ABNORMAL HIGH (ref 180–914)

## 2019-12-20 NOTE — Telephone Encounter (Signed)
Pt needs refill on Potassium.. please advise

## 2019-12-20 NOTE — Progress Notes (Signed)
[place] Progress Notes  Call from Edwyna Shell, RN - patient and caregiver do not have way to get home after being dropped off by daughter.  As [employer] is closed today, taxi voucher provided as a one time situation.  Referred to [employer] for future rides.  She asks that they call her at [phone] as her cell phone is not working.    Edwyna Shell, LCSW Clinical Social Worker Phone:  [phone]

## 2019-12-20 NOTE — Telephone Encounter (Signed)
Rx sent 

## 2019-12-21 ENCOUNTER — Encounter: Payer: Self-pay | Admitting: Hematology and Oncology

## 2019-12-21 ENCOUNTER — Telehealth: Payer: Self-pay | Admitting: Adult Health

## 2019-12-21 DIAGNOSIS — J449 Chronic obstructive pulmonary disease, unspecified: Secondary | ICD-10-CM

## 2019-12-21 LAB — ERYTHROPOIETIN: Erythropoietin: 15.8 m[IU]/mL (ref 2.6–18.5)

## 2019-12-21 MED ORDER — ALBUTEROL SULFATE HFA 108 (90 BASE) MCG/ACT IN AERS: INHALATION_SPRAY | RESPIRATORY_TRACT | 0 refills | Status: DC

## 2019-12-21 NOTE — Telephone Encounter (Signed)
ATC patient but she did not answer and her VM was full. Will attempt to call back later today.

## 2019-12-21 NOTE — Telephone Encounter (Signed)
Pt called back about this, would like a call back

## 2019-12-21 NOTE — Telephone Encounter (Signed)
Called patient back but she did not answer. VM is full.

## 2019-12-21 NOTE — Telephone Encounter (Signed)
High risk for decompensation. Sent Z-Pak #1 take as directed.  No refills.  Mucinex DM twice daily as needed for cough congestion.  Make sure she has a follow-up visit in the office in the next 3 to 4 weeks.  If not improving will need sooner office visit or go to the emergency room  Please contact office for sooner follow up if symptoms do not improve or worsen or seek emergency care

## 2019-12-21 NOTE — Telephone Encounter (Signed)
Cough x 2 days light brown sputum and wheezing  She states she ran out of her albuterol inhaler  Still takes anoro daily  I refilled her albuterol inhaler  She is asking for abx  She denies f/c/s, body aches  No appts available to offer today before the weekend

## 2019-12-25 ENCOUNTER — Ambulatory Visit (HOSPITAL_COMMUNITY)
Admission: RE | Admit: 2019-12-25 | Discharge: 2019-12-25 | Disposition: A | Discharge status: Home / Self Care | Payer: Medicaid Other | Source: Ambulatory Visit | Attending: Cardiology | Admitting: Cardiology

## 2019-12-25 ENCOUNTER — Other Ambulatory Visit: Payer: Self-pay

## 2019-12-25 VITALS — BP 110/68 | HR 70 | Wt 345.0 lb

## 2019-12-25 DIAGNOSIS — I130 Hypertensive heart and chronic kidney disease with heart failure and stage 1 through stage 4 chronic kidney disease, or unspecified chronic kidney disease: Secondary | ICD-10-CM | POA: Diagnosis not present

## 2019-12-25 DIAGNOSIS — N183 Chronic kidney disease, stage 3 unspecified: Secondary | ICD-10-CM | POA: Diagnosis not present

## 2019-12-25 DIAGNOSIS — E669 Obesity, unspecified: Secondary | ICD-10-CM | POA: Insufficient documentation

## 2019-12-25 DIAGNOSIS — Z87891 Personal history of nicotine dependence: Secondary | ICD-10-CM | POA: Insufficient documentation

## 2019-12-25 DIAGNOSIS — I252 Old myocardial infarction: Secondary | ICD-10-CM | POA: Diagnosis not present

## 2019-12-25 DIAGNOSIS — J449 Chronic obstructive pulmonary disease, unspecified: Secondary | ICD-10-CM | POA: Diagnosis not present

## 2019-12-25 DIAGNOSIS — Z955 Presence of coronary angioplasty implant and graft: Secondary | ICD-10-CM | POA: Insufficient documentation

## 2019-12-25 DIAGNOSIS — Z8674 Personal history of sudden cardiac arrest: Secondary | ICD-10-CM | POA: Insufficient documentation

## 2019-12-25 DIAGNOSIS — I5022 Chronic systolic (congestive) heart failure: Secondary | ICD-10-CM | POA: Diagnosis present

## 2019-12-25 DIAGNOSIS — E785 Hyperlipidemia, unspecified: Secondary | ICD-10-CM | POA: Diagnosis not present

## 2019-12-25 DIAGNOSIS — Z8673 Personal history of transient ischemic attack (TIA), and cerebral infarction without residual deficits: Secondary | ICD-10-CM | POA: Diagnosis not present

## 2019-12-25 DIAGNOSIS — Z79899 Other long term (current) drug therapy: Secondary | ICD-10-CM | POA: Diagnosis not present

## 2019-12-25 DIAGNOSIS — I2510 Atherosclerotic heart disease of native coronary artery without angina pectoris: Secondary | ICD-10-CM | POA: Diagnosis not present

## 2019-12-25 DIAGNOSIS — Z8249 Family history of ischemic heart disease and other diseases of the circulatory system: Secondary | ICD-10-CM | POA: Diagnosis not present

## 2019-12-25 DIAGNOSIS — G4733 Obstructive sleep apnea (adult) (pediatric): Secondary | ICD-10-CM | POA: Diagnosis not present

## 2019-12-25 DIAGNOSIS — Z86711 Personal history of pulmonary embolism: Secondary | ICD-10-CM | POA: Insufficient documentation

## 2019-12-25 DIAGNOSIS — Z95810 Presence of automatic (implantable) cardiac defibrillator: Secondary | ICD-10-CM | POA: Insufficient documentation

## 2019-12-25 DIAGNOSIS — Z794 Long term (current) use of insulin: Secondary | ICD-10-CM | POA: Insufficient documentation

## 2019-12-25 DIAGNOSIS — Z9981 Dependence on supplemental oxygen: Secondary | ICD-10-CM | POA: Insufficient documentation

## 2019-12-25 DIAGNOSIS — I272 Pulmonary hypertension, unspecified: Secondary | ICD-10-CM | POA: Diagnosis not present

## 2019-12-25 DIAGNOSIS — E1122 Type 2 diabetes mellitus with diabetic chronic kidney disease: Secondary | ICD-10-CM | POA: Insufficient documentation

## 2019-12-25 DIAGNOSIS — Z833 Family history of diabetes mellitus: Secondary | ICD-10-CM | POA: Insufficient documentation

## 2019-12-25 DIAGNOSIS — D509 Iron deficiency anemia, unspecified: Secondary | ICD-10-CM | POA: Diagnosis not present

## 2019-12-25 LAB — BASIC METABOLIC PANEL
Anion gap: 9 (ref 5–15)
BUN: 68 mg/dL — ABNORMAL HIGH (ref 6–20)
CO2: 33 mmol/L — ABNORMAL HIGH (ref 22–32)
Calcium: 9.1 mg/dL (ref 8.9–10.3)
Chloride: 101 mmol/L (ref 98–111)
Creatinine, Ser: 2.52 mg/dL — ABNORMAL HIGH (ref 0.44–1.00)
GFR calc Af Amer: 23 mL/min — ABNORMAL LOW (ref >60)
GFR calc non Af Amer: 20 mL/min — ABNORMAL LOW (ref >60)
Glucose, Bld: 99 mg/dL (ref 70–99)
Potassium: 4.5 mmol/L (ref 3.5–5.1)
Sodium: 143 mmol/L (ref 135–145)

## 2019-12-25 MED ORDER — METOLAZONE 2.5 MG PO TABS
2.5000 mg | ORAL_TABLET | ORAL | 3 refills | Status: DC
Start: 2019-12-25 — End: 2020-03-19

## 2019-12-25 MED ORDER — AZITHROMYCIN 250 MG PO TABS
ORAL_TABLET | ORAL | 0 refills | Status: DC
Start: 2019-12-25 — End: 2020-01-25

## 2019-12-25 NOTE — Telephone Encounter (Signed)
Called pharmacy to see if zpak had been sent to pharmacy for pt on 7/23 per TP's response and the only med that they received was for albuterol inhaler. zpak had not been sent for pt.  Called and spoke with pt about message per TP. Pt verbalized understanding. zpak abx has been sent to pharmacy for pt. Pt has also been scheduled a follow up appt to reassess after abx. Nothing further needed.

## 2019-12-25 NOTE — Patient Instructions (Signed)
CONTINUE Torsemide 100 mg twice a day  Labs today We will only contact you if something comes back abnormal or we need to make some changes. Otherwise no news is good news!  You have been referred to [facility] Address: [address], [place], [place] [address] Phone: [phone] -they will be in contact for an appointment  You have been referred to [facility] Address: [place], [place], [place] [address] Phone: [phone] -they will be in contact for an appointment  You have been referred to [facility] -they will be in contact for an appointment  You have been referred to [facility] with Dr Radford Pax Address: [place] [address] Phone: [phone] -they will be in contact for an appointment   Your physician recommends that you schedule a follow-up appointment in: 1 months  in the Advanced Practitioners (PA/NP) Clinic

## 2019-12-26 NOTE — Progress Notes (Signed)
IDMarysue Silva, DOB July 13, 1960, MRN [id number]   Provider location: [facility] Type of Visit: Established patient   PCP:  Ladell Pier, MD  HF Cardiology: Dr Aundra Dubin   History of Present Illness: Tamara Silva is a 59 y.o. 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  She has had frequent admits for anemia and CHF with transfusions and diuresis. Prasugrel was stopped. No source of bleeding found on EGD and colonoscopy.   She is on home 02 and nocturnal BiPAP for OHS/OSA and COPD.   She no longer smokes.  Hx of PE in 2014.    Hx of bradycardia with Coreg at higher dose.  Echo 05/31/18 with EF 40-45%, PA peak pressure 71 mmHg.    Admitted 10/25/18 with SOB and edema, + productive cough.  This started over a few weeks prior to admission. Hospital course complicated by PEA arrest on 10/28/18. CPR for 25 minutes. Extubated 10/30/18.  Developed AKI.  HF team followed closely to optimize HF.  She was massively volume overloaded and extensively diuresed, weight down 65 lbs total.  Echo showed EF down to 30-35%.  No coronary angiography due to AKI.   Readmitted  11/17/18 with lower extremity edema and volume overload. Diuresed with IV lasix and transitioned to torsemide 40 mg twice daily. Discharged on 11/21/18 to home on oxygen.   She had RHC/LHC in 9/20, most significant stenosis was 75% ostial D1.  There was mild to moderate nonobstructive disease in other vessels. There was not an interventional target.  Filling pressures were mildly elevated with preserved cardiac output.  Torsemide was increased.   Echo in 10/20 showed that EF remained 30-35% with wall motion abnormalities, RV appeared normal.   Medtronic ICD placed in 11/20.   She returns today for followup of CHF.  She is wearing home oxygen but is not using CPAP regularly, has a hard time with  the mask.  Weight is up 8 lbs.  No dyspnea walking in her house but short of breath walking longer distances.  She reports chronic orthopnea.  No chest pain.   No lightheadedness.    REDS clip attempt poor quality.   Labs (7/20): K 3.7, creatinine 1.56 Labs (9/20): K 3.7, creatinine 1.46, LDL 52, HDL 56  Labs (12/20): K 3.8, creatinine 1.51 Labs (1/21): hgb 10.6, K 4.4, creatinine 1.91 Labs (5/21): LDL 68, HDL 56 Labs (7/21): K 4.4, creatinine 2.34, hgb 9.7  PMH: 1. CVA (1/20). 2. Type 2 diabetes 3. HTN 4. H/o seizures 5. PEA arrest (5/20) with CPR.  6. COPD: Home oxygen.  Stopped smoking in 1/15.  7. CAD: PCI [facility] 2006, [facility] 2009.  - Inferior STEMI 5/15, DES to RCA.  - Poor responder to Plavix.  - LHC (9/20): 75% ostial D1, 40-50% mLCx, 40% proximal PLV.  8. Hyperlipidemia 9. PE: 2014, LLL.  10. OHS/OSA: Home oxygen and Bipap used.  11. Chronic systolic CHF: Suspect primarily ischemic cardiomyopathy. Medtronic ICD.  - Echo (5/15): EF 35-40% - Echo (2/16): EF 40-45% - Echo (1/20): EF 40-45% - Echo (5/20): EF 30-35%, mild RV dilation.  - RHC (5/20): mean RA 9, PA 62/22 mean 37, mean PCWP 15, CI 4.29, PVR 2.1 WU.  - RHC (9/20): mean RA 11, PA 66/18 mean 37, mean PCWP 19, CI  3.12, PVR 2.4 WU - Echo (10/20): EF 30-35% with wall motion abnormalities, normal RV.  12. CKD: Stage 3.  13. Fe deficiency anemia: EGD and colonoscopy without definite source of bleeding.   ROS: All systems negative except as listed in HPI, PMH and Problem List.  SH:  Social History   Socioeconomic History   Marital status: Single    Spouse name: Not on file   Number of children: 1   Years of education: Not on file   Highest education level: Not on file  Occupational History   Not on file  Tobacco Use   Smoking status: Former Smoker    Packs/day: 0.50    Years: 20.00    Pack years: 10.00    Types: Cigarettes    Quit date: 05/31/2018    Years since quitting: 1.5   Smokeless  tobacco: Never Used   Tobacco comment: smoked off and on x 20 years  Vaping Use   Vaping Use: Never used  Substance and Sexual Activity   Alcohol use: No    Alcohol/week: 0.0 standard drinks   Drug use: Not Currently    Types: Cocaine   Sexual activity: Not Currently  Other Topics Concern   Not on file  Social History Narrative   Origibnally from [place]   Most recently from [place]   Daughter lives in town      On Social security to CHF, COPD, CAD   Social Determinants of Health   Financial Resource Strain:    Difficulty of Paying Living Expenses:   Food Insecurity:    Worried About Charity fundraiser in the Last Year:    Arboriculturist in the Last Year:   Transportation Needs:    Film/video editor (Medical):    Lack of Transportation (Non-Medical):   Physical Activity:    Days of Exercise per Week:    Minutes of Exercise per Session:   Stress:    Feeling of Stress :   Social Connections:    Frequency of Communication with Friends and Family:    Frequency of Social Gatherings with Friends and Family:    Attends Religious Services:    Active Member of Clubs or Organizations:    Attends Music therapist:    Marital Status:   Intimate Partner Violence:    Fear of Current or Ex-Partner:    Emotionally Abused:    Physically Abused:    Sexually Abused:     FH:  Family History  Problem Relation Age of Onset   Stroke Mother    Hypertension Mother    Colon cancer Father    Diabetes Father    Heart attack Father    Sarcoidosis Other    Breast cancer Paternal Aunt    Emphysema Brother    Lung disease Brother        Unknown type, 3 brothers, one with liver and lung disease   Diabetes Brother    Asthma Paternal Aunt    Diabetes Sister     Current Outpatient Medications  Medication Sig Dispense Refill   acetaminophen (TYLENOL) 500 MG tablet Take 500 mg by mouth every 6 (six) hours as needed for moderate  pain or headache.     albuterol (PROAIR HFA) 108 (90 Base) MCG/ACT inhaler INHALE 2 PUFFS BY MOUTH 4 TIMES DAILY AS NEEDED 18 g 0   ascorbic acid (VITAMIN C) 500 MG tablet Take 500 mg by mouth daily.     ASPIRIN LOW  DOSE 81 MG EC tablet CHEW 1 TABLET (81 MG TOTAL) BY MOUTH DAILY. 90 tablet 3   azithromycin (ZITHROMAX) 250 MG tablet Take two today and then one daily until finished 6 tablet 0   bisoprolol (ZEBETA) 5 MG tablet Take 1 tablet (5 mg total) by mouth daily. 30 tablet 6   Blood Glucose Monitoring Suppl (ACCU-CHEK GUIDE) w/Device KIT Use accu chek guide to check blood sugar three times daily. 1 kit 0   ferrous sulfate 325 (65 FE) MG tablet Take 1 tablet (325 mg total) by mouth daily after supper. Please take with a source of vitamin C 90 tablet 3   fluticasone (FLONASE) 50 MCG/ACT nasal spray PLACE 1 SPRAY INTO BOTH NOSTRILS DAILY. 16 g 1   gabapentin (NEURONTIN) 300 MG capsule TAKE 1 CAPSULE (300 MG TOTAL) BY MOUTH 3 (THREE) TIMES DAILY. START WITH 1 CAPSULE AT BEDTIME FOR 7 DAYS THEN INCREASE TO 3 TIMES A DAY IF TOLERATED 90 capsule 3   glucose blood (ACCU-CHEK GUIDE) test strip USE AS INSTRUCTED TO CHECK BLOOD SUGAR THREE TIMES DAILY. 100 each 2   hydrocerin (EUCERIN) CREA Apply 1 application topically 2 (two) times daily.  0   insulin degludec (TRESIBA FLEXTOUCH) 200 UNIT/ML FlexTouch Pen Inject 80 units under the skin once daily. 15 mL 2   Insulin Pen Needle (EASY COMFORT PEN NEEDLES) 31G X 5 MM MISC USE 3 TIMES A DAY FOR INSULIN ADMINISTRATION 100 each 12   ipratropium-albuterol (DUONEB) 0.5-2.5 (3) MG/3ML SOLN Use nebulizer to inhale contents of one vial every 6 hours as needed for shortness of breath, wheezing 360 mL 11   isosorbide-hydrALAZINE (BIDIL) 20-37.5 MG tablet Take 1 tablet by mouth 3 (three) times daily. 270 tablet 3   NEEDLE, DISP, 26 G 26G X 1/2" MISC 1 application by Does not apply route daily. 100 each 0   NOVOLOG FLEXPEN 100 UNIT/ML FlexPen Inject 20  units under the skin at breakfast and lunch, and 24 units at dinner. 30 mL 4   omeprazole (PRILOSEC) 40 MG capsule TAKE 1 CAPSULE (40 MG TOTAL) BY MOUTH DAILY. 30 capsule 2   PAZEO 0.7 % SOLN Place 1 drop into both eyes daily.      potassium chloride SA (KLOR-CON) 20 MEQ tablet TAKE 2 TABLETS (40 MEQ TOTAL) BY MOUTH DAILY. 90 tablet 0   RESTASIS 0.05 % ophthalmic emulsion Place 1 drop into both eyes daily as needed.      rosuvastatin (CRESTOR) 40 MG tablet TAKE 1 TABLET (40 MG TOTAL) BY MOUTH DAILY AT 6 PM. 30 tablet 3   sacubitril-valsartan (ENTRESTO) 24-26 MG Take 1 tablet by mouth 2 (two) times daily. 60 tablet 5   torsemide (DEMADEX) 100 MG tablet Take 100 mg by mouth 2 (two) times daily.     umeclidinium-vilanterol (ANORO ELLIPTA) 62.5-25 MCG/INH AEPB Inhale 1 puff into the lungs daily. 30 each 5   VICTOZA 18 MG/3ML SOPN INJECT 1.8 MG INTO THE SKIN DAILY. 9 mL 2   vitamin B-12 (CYANOCOBALAMIN) 1000 MCG tablet Take 1,000 mcg by mouth daily.     metolazone (ZAROXOLYN) 2.5 MG tablet Take 1 tablet (2.5 mg total) by mouth once a week. Wed 4 tablet 3   No current facility-administered medications for this encounter.    Vitals:   12/25/19 1129  BP: 110/68  Pulse: 70  SpO2: 91%  Weight: (!) 156.5 kg (345 lb)   Wt Readings from Last 3 Encounters:  12/25/19 (!) 156.5 kg (345 lb)  12/20/19 [name] Kitchen)  156.6 kg (345 lb 3.2 oz)  11/22/19 (!) 155.3 kg (342 lb 6.4 oz)   Exam:   General: NAD Neck: JVP 8 cm, no thyromegaly or thyroid nodule.  Lungs: Clear to auscultation bilaterally with normal respiratory effort. CV: Nondisplaced PMI.  Heart regular S1/S2, no S3/S4, no murmur.  1+ ankle edema.  No carotid bruit.  Normal pedal pulses.  Abdomen: Soft, nontender, no hepatosplenomegaly, no distention.  Skin: Intact without lesions or rashes.  Neurologic: Alert and oriented x 3.  Psych: Normal affect. Extremities: No clubbing or cyanosis.  HEENT: Normal.    ASSESSMENT & PLAN: 1. H/O PEA  arrest: Due to severe hypoxemia in 5/20 in setting of CHF exacerbation.   2. Chronic systolic CHF: Suspect ischemic cardiomyopathy. Medtronic ICD.  Echo in 1/20 with EF 40-45%, RV moderately dilated, PASP 71 mmHg. Echo in 5/20 with lower EF, 30-35%, and mildly dilated RV. Suspect significant RV failure, may be related to OHS/OSA and COPD. She was massively volume overloaded during 5/20 admission and diuresed extensively.  RHC done 11/06/18 showed high cardiac output and minimally elevated filling pressures after diuresis.  PVR was not elevated, appeared to be high output PH.  RHC was done again in 9/20, filling pressures were elevated with preserved cardiac output.  Echo in 10/20 showed EF still 30-35%.  NYHA class III symptoms.  Weight is up.  She looks mildly volume overloaded on exam.  REDS clip not accurate.  CHF is complicated by cardiorenal syndrome.   - Continue torsemide 100 mg bid.   - Start metolazone 2.5 mg once weekly on Wednesdays.  BMET today and in 10 days.    - Continue Bidil 1 tab tid.   - She is off spironolactone, ?restart low dose if creatinine stabilizes.   - Continue Entresto 24/26 bid.  - Given significant lung disease/COPD, I have her on bisoprolol 5 mg daily (more beta-1 selective).  - Refer for cardiac rehab.  3. CKD: Stage III. Last creatinine 2.3.  - BMET today.  4. COPD: On home oxygen at baseline. Has quit smoking.  5. OHS/OSA: On home oxygen at all times and should use CPAP at night, but she has had trouble with CPAP.  Will refer her back to sleep medicine to see if she can find a form of CPAP that she can tolerate.  6. CAD: History of PCI, most recently had inferior STEMI in 5/15 with DES to RCA. With fall in EF this year, coronary angiography was done in 9/20.  There was 75% ostial D1 stenosis but no good target for PCI. No chest pain.  - Continue ASA 81 and Crestor 40 daily.  Good lipids in 9/20.    7. Remote PE: She has not been anticoagulated.  8. Anemia:  History of GI bleeding, had endoscopies earlier this year with no bleeding source found. Also with history of B12 deficiency.  9. Pulmonary hypertension: Pulmonary venous hypertension on last RHC in 9/20.  10. Obesity: We discussed diet and exercise.  - Refer to [facility].   Recommended follow-up:  1 month with NP/PA.   Signed, Loralie Champagne, MD  12/26/2019  [facility] [address] [facility] [place] [address] [phone] (office) [phone] (fax)

## 2019-12-27 ENCOUNTER — Telehealth (HOSPITAL_COMMUNITY): Payer: Self-pay

## 2019-12-27 NOTE — Telephone Encounter (Signed)
Called patient to see if she is interested in the Cardiac Rehab Program. Patient expressed interest. Explained scheduling process and went over insurance, patient verbalized understanding.  

## 2019-12-27 NOTE — Telephone Encounter (Signed)
Pt insurance is active through [place]. NID#[id number]  Will fax over Medicaid Reimbursement form to Dr. Aundra Dubin  Will contact pt to see if she is interested in the Cardiac Rehab program.

## 2019-12-31 ENCOUNTER — Telehealth (HOSPITAL_COMMUNITY): Payer: Self-pay | Admitting: Cardiology

## 2019-12-31 NOTE — Telephone Encounter (Signed)
Referral placed to [facility]  Fax# [phone]

## 2020-01-02 ENCOUNTER — Other Ambulatory Visit: Payer: Self-pay | Admitting: Endocrinology

## 2020-01-08 ENCOUNTER — Telehealth: Payer: Self-pay | Admitting: *Deleted

## 2020-01-08 DIAGNOSIS — G4733 Obstructive sleep apnea (adult) (pediatric): Secondary | ICD-10-CM

## 2020-01-08 NOTE — Telephone Encounter (Signed)
-----   Message from Rivka Barbara sent at 01/03/2020 10:44 AM EDT ----- Regarding: Sleep Patient has a referral to see Dr. Radford Pax but I am not showing a sleep study. Please review

## 2020-01-08 NOTE — Telephone Encounter (Signed)
Split night study ordered for patient per Dr Aundra Dubin.

## 2020-01-09 ENCOUNTER — Ambulatory Visit (HOSPITAL_COMMUNITY): Payer: Self-pay | Admitting: Psychiatry

## 2020-01-10 ENCOUNTER — Telehealth: Payer: Self-pay | Admitting: *Deleted

## 2020-01-10 NOTE — Telephone Encounter (Signed)
Staff message sent to Gae Bon ok to schedule sleep study.patient has [place] MCD. No PA is required.

## 2020-01-11 ENCOUNTER — Other Ambulatory Visit: Payer: Self-pay | Admitting: Nephrology

## 2020-01-11 DIAGNOSIS — N184 Chronic kidney disease, stage 4 (severe): Secondary | ICD-10-CM

## 2020-01-15 NOTE — Telephone Encounter (Signed)
°  Lauralee Evener, CMA  Freada Bergeron, CMA Ok to schedule sleep study. Patient has regular MCD. No PA is required.          ----- Message -----  From: Freada Bergeron, CMA  Sent: 01/08/2020  2:47 PM EDT  To: [facility]  Subject: precert                      Split night -Dr Aundra Dubin  ----- Message -----  From: Rivka Barbara  Sent: 01/03/2020 10:44 AM EDT  To: Freada Bergeron, CMA  Subject: Sleep                       Patient has a referral to see Dr. Radford Pax but I am not showing a sleep study. Please review

## 2020-01-15 NOTE — Telephone Encounter (Signed)
Patient is scheduled for lab study on 02/18/20. Patient understands her sleep study will be done at [facility] sleep lab. Patient understands she will receive a sleep packet in a week or so. Patient understands to call if she does not receive the sleep packet in a timely manner. Patient agrees with treatment and thanked me for call.

## 2020-01-16 ENCOUNTER — Ambulatory Visit: Payer: Medicaid Other | Admitting: Adult Health

## 2020-01-16 ENCOUNTER — Other Ambulatory Visit (HOSPITAL_COMMUNITY): Payer: Self-pay | Admitting: Cardiology

## 2020-01-16 NOTE — Telephone Encounter (Signed)
This is a CHF pt 

## 2020-01-17 NOTE — Telephone Encounter (Signed)
Called patient to see if she was interested in participating in the Cardiac Rehab Program. Patient stated yes. Patient will come in for orientation on 02/26/20 @ 10AM and will attend the 915AM exercise class.  Mailed letter

## 2020-01-21 ENCOUNTER — Other Ambulatory Visit: Payer: Medicaid Other

## 2020-01-22 ENCOUNTER — Ambulatory Visit (INDEPENDENT_AMBULATORY_CARE_PROVIDER_SITE_OTHER): Payer: Medicaid Other | Admitting: *Deleted

## 2020-01-22 ENCOUNTER — Other Ambulatory Visit (HOSPITAL_COMMUNITY): Payer: Self-pay | Admitting: Cardiovascular Disease

## 2020-01-22 DIAGNOSIS — I255 Ischemic cardiomyopathy: Secondary | ICD-10-CM

## 2020-01-24 LAB — CUP PACEART REMOTE DEVICE CHECK
Battery Remaining Longevity: 133 mo
Battery Voltage: 3.06 V
Brady Statistic RV Percent Paced: 0.02 %
Date Time Interrogation Session: 20210825121845
HighPow Impedance: 66 Ohm
Implantable Lead Implant Date: 20201104
Implantable Lead Location: 753860
Implantable Pulse Generator Implant Date: 20201104
Lead Channel Impedance Value: 304 Ohm
Lead Channel Impedance Value: 380 Ohm
Lead Channel Pacing Threshold Amplitude: 0.625 V
Lead Channel Pacing Threshold Pulse Width: 0.4 ms
Lead Channel Sensing Intrinsic Amplitude: 16 mV
Lead Channel Sensing Intrinsic Amplitude: 16 mV
Lead Channel Setting Pacing Amplitude: 2.5 V
Lead Channel Setting Pacing Pulse Width: 0.4 ms
Lead Channel Setting Sensing Sensitivity: 0.3 mV

## 2020-01-25 ENCOUNTER — Ambulatory Visit: Payer: Medicaid Other | Attending: Internal Medicine | Admitting: Internal Medicine

## 2020-01-25 ENCOUNTER — Encounter: Payer: Self-pay | Admitting: Internal Medicine

## 2020-01-25 ENCOUNTER — Other Ambulatory Visit: Payer: Self-pay

## 2020-01-25 VITALS — Wt 343.0 lb

## 2020-01-25 DIAGNOSIS — E662 Morbid (severe) obesity with alveolar hypoventilation: Secondary | ICD-10-CM

## 2020-01-25 DIAGNOSIS — Z794 Long term (current) use of insulin: Secondary | ICD-10-CM

## 2020-01-25 DIAGNOSIS — Z1231 Encounter for screening mammogram for malignant neoplasm of breast: Secondary | ICD-10-CM

## 2020-01-25 DIAGNOSIS — E1142 Type 2 diabetes mellitus with diabetic polyneuropathy: Secondary | ICD-10-CM

## 2020-01-25 DIAGNOSIS — N184 Chronic kidney disease, stage 4 (severe): Secondary | ICD-10-CM | POA: Diagnosis not present

## 2020-01-25 DIAGNOSIS — D638 Anemia in other chronic diseases classified elsewhere: Secondary | ICD-10-CM

## 2020-01-25 DIAGNOSIS — I5022 Chronic systolic (congestive) heart failure: Secondary | ICD-10-CM | POA: Diagnosis not present

## 2020-01-25 DIAGNOSIS — D509 Iron deficiency anemia, unspecified: Secondary | ICD-10-CM

## 2020-01-25 NOTE — Progress Notes (Signed)
Virtual Visit via Telephone Note Due to current restrictions/limitations of in-office visits due to the COVID-19 pandemic, this scheduled clinical appointment was converted to a telehealth visit  I connected with Tamara Silva on 01/25/20 at 9:59 a.m EDT by telephone and verified that I am speaking with the correct person using two identifiers. I am in my office.  The patient is at home.  Only the patient and myself participated in this encounter.  I discussed the limitations, risks, security and privacy concerns of performing an evaluation and management service by telephone and the availability of in person appointments. I also discussed with the patient that there may be a patient responsible charge related to this service. The patient expressed understanding and agreed to proceed.   History of Present Illness: Pt with hx of HTN, Combined chronic CHF, cardiac arrest, CAD (followed by Dr. Glenford Peers), CVA, chronic PE, OHS(noncompliant with BiPAP),COPD, hypoxic respiratory failure on home O23 Lt continuous,DM, obesity, CKD stage 3-4, formertob dep, IDA/ACD (history GI bleed with endoscopies2/2020- poor prep of colon on c-scope) Vit B12 def, polyclonal gammopathy (followed by Dr. Lorenso Courier). Last seen 11/22/19.  Today is for chronic ds management.  CKD 4:  Seen by [facility] about 1 mth ago.  Spironolactone and Gabapentin discontinued.   Systolic CHF/HTN: Saw Dr. Aundra Dubin 1 mth ago.  She was a little fluid overload.  He added Metolazone 2.5 mg once a wk.  Making good urine with torsemide and the once weekly Metolazone. -wgh today is 343 lbs.  No LE edema.   -she has decrease salt intake significantly -checks BP several times a wk.  Reports readings have been good.  No SOB at rest, a little if she exerts herself  DIABETES TYPE 2 Last A1C:   Lab Results  Component Value Date   HGBA1C 7.4 (A) 10/22/2019   Med Adherence:  [x]  Yes    []  No Medication side effects:  []  Yes    [x]   No Home Monitoring?  [x]  Yes  2-3 times a day before meals Home glucose results range: Some of her most recent readings: Day 1. 127, 101, 136.  Day 2. 205, 138, 130, 104, 143, 144  Diet Adherence: [x]  Yes - doing much better Exercise:  Hypoglycemic episodes?: [x]  Yes - lowest was 78   Numbness of the feet? []  Yes    []  No Retinopathy hx? []  Yes    []  No Last eye exam:  Due for eye exam Comments:   Anemia/B12 def:  Saw Dr. Lorenso Courier last mth Compliant with Vit B12 and iron supplement  OHS: referred to sleep med by cardiologist to see if they can help  -not using BiPAP every night.  Wants to use it every night but causes dry mouth and feels like she can not breath right with it.  "Feels like it has air pockets in there."   HM:  Due to MMG, eye exam, pap.  Needs flu and Tdap. COVID vaccine completed Outpatient Encounter Medications as of 01/25/2020  Medication Sig  . acetaminophen (TYLENOL) 500 MG tablet Take 500 mg by mouth every 6 (six) hours as needed for moderate pain or headache.  . albuterol (PROAIR HFA) 108 (90 Base) MCG/ACT inhaler INHALE 2 PUFFS BY MOUTH 4 TIMES DAILY AS NEEDED  . ascorbic acid (VITAMIN C) 500 MG tablet Take 500 mg by mouth daily.  . ASPIRIN LOW DOSE 81 MG EC tablet CHEW 1 TABLET (81 MG TOTAL) BY MOUTH DAILY.  [name] Kitchen azithromycin (ZITHROMAX) 250 MG tablet  Take two today and then one daily until finished  . bisoprolol (ZEBETA) 5 MG tablet Take 1 tablet (5 mg total) by mouth daily.  . Blood Glucose Monitoring Suppl (ACCU-CHEK GUIDE) w/Device KIT Use accu chek guide to check blood sugar three times daily.  . ferrous sulfate 325 (65 FE) MG tablet Take 1 tablet (325 mg total) by mouth daily after supper. Please take with a source of vitamin C  . fluticasone (FLONASE) 50 MCG/ACT nasal spray PLACE 1 SPRAY INTO BOTH NOSTRILS DAILY.  [name] Kitchen gabapentin (NEURONTIN) 300 MG capsule TAKE 1 CAPSULE (300 MG TOTAL) BY MOUTH 3 (THREE) TIMES DAILY. START WITH 1 CAPSULE AT BEDTIME FOR 7 DAYS THEN  INCREASE TO 3 TIMES A DAY IF TOLERATED  . glucose blood (ACCU-CHEK GUIDE) test strip USE AS INSTRUCTED TO CHECK BLOOD SUGAR THREE TIMES DAILY.  . hydrocerin (EUCERIN) CREA Apply 1 application topically 2 (two) times daily.  . insulin degludec (TRESIBA FLEXTOUCH) 200 UNIT/ML FlexTouch Pen Inject 80 units under the skin once daily.  . Insulin Pen Needle (EASY COMFORT PEN NEEDLES) 31G X 5 MM MISC USE 3 TIMES A DAY FOR INSULIN ADMINISTRATION  . ipratropium-albuterol (DUONEB) 0.5-2.5 (3) MG/3ML SOLN Use nebulizer to inhale contents of one vial every 6 hours as needed for shortness of breath, wheezing  . isosorbide-hydrALAZINE (BIDIL) 20-37.5 MG tablet Take 1 tablet by mouth 3 (three) times daily.  . metolazone (ZAROXOLYN) 2.5 MG tablet Take 1 tablet (2.5 mg total) by mouth once a week. Wed  . NEEDLE, DISP, 26 G 26G X 1/2" MISC 1 application by Does not apply route daily.  [name] Kitchen NOVOLOG FLEXPEN 100 UNIT/ML FlexPen Inject 20 units under the skin at breakfast and lunch, and 24 units at dinner.  [name] Kitchen omeprazole (PRILOSEC) 40 MG capsule TAKE 1 CAPSULE (40 MG TOTAL) BY MOUTH DAILY.  [name] Kitchen PAZEO 0.7 % SOLN Place 1 drop into both eyes daily.   . potassium chloride SA (KLOR-CON) 20 MEQ tablet TAKE 2 TABLETS (40 MEQ TOTAL) BY MOUTH DAILY.  [name] Kitchen RESTASIS 0.05 % ophthalmic emulsion Place 1 drop into both eyes daily as needed.   . rosuvastatin (CRESTOR) 40 MG tablet TAKE 1 TABLET (40 MG TOTAL) BY MOUTH DAILY AT 6 PM.  . sacubitril-valsartan (ENTRESTO) 24-26 MG Take 1 tablet by mouth 2 (two) times daily.  [name] Kitchen torsemide (DEMADEX) 100 MG tablet TAKE 1 TABLET (100 MG TOTAL) BY MOUTH 2 (TWO) TIMES DAILY.  [name] Kitchen umeclidinium-vilanterol (ANORO ELLIPTA) 62.5-25 MCG/INH AEPB Inhale 1 puff into the lungs daily.  [name] Kitchen VICTOZA 18 MG/3ML SOPN INJECT 1.8 MG INTO THE SKIN DAILY.  . vitamin B-12 (CYANOCOBALAMIN) 1000 MCG tablet Take 1,000 mcg by mouth daily.   No facility-administered encounter medications on file as of 01/25/2020.      Observations/Objective:  Wt Readings from Last 3 Encounters:  01/25/20 (!) 343 lb (155.6 kg)  12/25/19 (!) 345 lb (156.5 kg)  12/20/19 (!) 345 lb 3.2 oz (156.6 kg)      Chemistry      Component Value Date/Time   NA 143 12/25/2019 1157   NA 142 03/19/2019 1218   K 4.5 12/25/2019 1157   CL 101 12/25/2019 1157   CO2 33 (H) 12/25/2019 1157   BUN 68 (H) 12/25/2019 1157   BUN 30 (H) 03/19/2019 1218   CREATININE 2.52 (H) 12/25/2019 1157   CREATININE 2.34 (H) 12/20/2019 0810   CREATININE 0.89 07/08/2015 1645      Component Value Date/Time   CALCIUM 9.1 12/25/2019 1157  ALKPHOS 86 12/20/2019 0810   AST 16 12/20/2019 0810   ALT 12 12/20/2019 0810   BILITOT <0.2 (L) 12/20/2019 0810     Lab Results  Component Value Date   WBC 8.5 12/20/2019   HGB 9.7 (L) 12/20/2019   HCT 31.9 (L) 12/20/2019   MCV 84.6 12/20/2019   PLT 255 12/20/2019   Lab Results  Component Value Date   CHOL 150 10/22/2019   HDL 56.50 10/22/2019   LDLCALC 68 10/22/2019   TRIG 129.0 10/22/2019   CHOLHDL 3 10/22/2019     Assessment and Plan: 1. Type 2 diabetes mellitus with diabetic polyneuropathy, with long-term current use of insulin (HCC) Blood sugars are acceptable. Encourage her to continue healthy eating habits. Continue current dose of Tresiba, Victoza, NovoLog - Ambulatory referral to Ophthalmology  2. Chronic systolic CHF (congestive heart failure) (HCC) Stable. Encouraged her to continue medications as prescribed by cardiology and low-salt diet. She will continue to weigh herself at least twice a week.  3. CKD (chronic kidney disease) stage 4, GFR 15-29 ml/min [facility]) She has family gotten established with [facility]. Gabapentin taken off med list. Spironolactone already [place].  4. Anemia of chronic disease 5. Iron deficiency anemia, unspecified iron deficiency anemia type -Continue iron and B12 supplement.  6. Obesity hypoventilation syndrome ([place]) Keep appointment with  sleep medicine to try help, with the remedy for her to be able to use the BiPAP consistently  7. Encounter for screening mammogram for malignant neoplasm of breast - MM Digital Screening; Future   Follow Up Instructions: 1 mth for pap. We will plan to give her the flu vaccine at that time.   I discussed the assessment and treatment plan with the patient. The patient was provided an opportunity to ask questions and all were answered. The patient agreed with the plan and demonstrated an understanding of the instructions.   The patient was advised to call back or seek an in-person evaluation if the symptoms worsen or if the condition fails to improve as anticipated.  I provided 23 minutes of non-face-to-face time during this encounter.   Karle Plumber, MD

## 2020-01-28 NOTE — Progress Notes (Signed)
Remote ICD transmission.   

## 2020-01-30 ENCOUNTER — Encounter: Payer: Self-pay | Admitting: Internal Medicine

## 2020-01-30 NOTE — Progress Notes (Signed)
Pt seen by Dr. Royce Macadamia with [facility] on 01/08/2020.  Dx with CKD 4 due to cardiorenal syndrome, DM nephropathy and microvascular ds from HTN.  Spironolactone d/c.  Renal US ordered.  Pt reported tremor.  Thought to be due to Gabapentin.  Told to decrease dose to 300 mg daily. Labs: creat 1.78/Bun 74, eGFR 35

## 2020-01-31 ENCOUNTER — Other Ambulatory Visit: Payer: Self-pay | Admitting: Endocrinology

## 2020-01-31 ENCOUNTER — Other Ambulatory Visit: Payer: Self-pay | Admitting: Internal Medicine

## 2020-01-31 DIAGNOSIS — IMO0002 Reserved for concepts with insufficient information to code with codable children: Secondary | ICD-10-CM

## 2020-01-31 DIAGNOSIS — E1142 Type 2 diabetes mellitus with diabetic polyneuropathy: Secondary | ICD-10-CM

## 2020-01-31 DIAGNOSIS — K219 Gastro-esophageal reflux disease without esophagitis: Secondary | ICD-10-CM

## 2020-02-05 ENCOUNTER — Other Ambulatory Visit: Payer: Self-pay | Admitting: Endocrinology

## 2020-02-05 DIAGNOSIS — IMO0002 Reserved for concepts with insufficient information to code with codable children: Secondary | ICD-10-CM

## 2020-02-05 DIAGNOSIS — E1142 Type 2 diabetes mellitus with diabetic polyneuropathy: Secondary | ICD-10-CM

## 2020-02-08 ENCOUNTER — Other Ambulatory Visit: Payer: Self-pay | Admitting: Nephrology

## 2020-02-11 ENCOUNTER — Other Ambulatory Visit: Payer: Self-pay | Admitting: Adult Health

## 2020-02-11 DIAGNOSIS — J449 Chronic obstructive pulmonary disease, unspecified: Secondary | ICD-10-CM

## 2020-02-13 ENCOUNTER — Telehealth: Payer: Self-pay | Admitting: *Deleted

## 2020-02-13 NOTE — Telephone Encounter (Signed)
PRIOR AUTHORIZATION  PA initiation date: 02/13/20   Medication: [employer]: [place] completed electronically through [employer] My Meds: Yes PA form completed and faxed to [employer]: Yes  Will await insurance response re: approval/denial.

## 2020-02-14 ENCOUNTER — Encounter (HOSPITAL_COMMUNITY): Payer: Medicaid Other

## 2020-02-15 ENCOUNTER — Other Ambulatory Visit: Payer: Medicaid Other

## 2020-02-18 ENCOUNTER — Ambulatory Visit (HOSPITAL_BASED_OUTPATIENT_CLINIC_OR_DEPARTMENT_OTHER): Payer: Medicaid Other | Attending: Cardiology | Admitting: Cardiology

## 2020-02-19 ENCOUNTER — Ambulatory Visit
Admission: RE | Admit: 2020-02-19 | Discharge: 2020-02-19 | Disposition: A | Discharge status: Home / Self Care | Payer: Medicaid Other | Source: Ambulatory Visit | Attending: Internal Medicine | Admitting: Internal Medicine

## 2020-02-19 ENCOUNTER — Other Ambulatory Visit: Payer: Self-pay

## 2020-02-19 DIAGNOSIS — Z1231 Encounter for screening mammogram for malignant neoplasm of breast: Secondary | ICD-10-CM

## 2020-02-20 ENCOUNTER — Telehealth: Payer: Self-pay | Admitting: Adult Health

## 2020-02-20 ENCOUNTER — Telehealth (HOSPITAL_COMMUNITY): Payer: Self-pay | Admitting: Pharmacist

## 2020-02-20 DIAGNOSIS — J449 Chronic obstructive pulmonary disease, unspecified: Secondary | ICD-10-CM

## 2020-02-20 NOTE — Telephone Encounter (Signed)
Cardiac Rehab Medication Review by a Pharmacist  Does the patient  feel that his/her medications are working for him/her?  yes  Has the patient been experiencing any side effects to the medications prescribed?  no  Does the patient measure his/her own blood pressure or blood glucose at home?  Yes, but does not recall last BP (does not check daily). Last BG was 207 post prandial, does not recall fasting BG recently.   Does the patient have any problems obtaining medications due to transportation or finances?   No, but reports she is behind on copays at the pharmacy (is allowed to pick up and pay copays later when she can).  Understanding of regimen: fair Understanding of indications: fair Potential of compliance: good  Pharmacist Intervention: Patient feels strongly that metolazone should be removed from allergy/reactions list. Patient is currently on the medication without any dizziness or falling. Will defer removal to Cardiology or PCP.   Fara Olden, PharmD PGY-1 Ambulatory Care Pharmacy Resident 02/20/2020 5:01 PM

## 2020-02-20 NOTE — Telephone Encounter (Signed)
Spoke with the pt  She is c/o cough with large amounts of white sputum x 3 days  She states also having some increased SOB and wheezing- minimal  She denies any fever or body aches  She is fully vaccinated against covid  She states still taking her anoro and using albuterol about once per day  Last given zpack telephonically by TP July 2021--has pending ov with TP on 02/27/20  She is asking for more abx until can get in for ov  States the congestion and cough is keeping her up at night  Please advise thanks!  Allergies  Allergen Reactions  . Metolazone Other (See Comments)    Dizziness and falling  . Plavix [Clopidogrel Bisulfate] Other (See Comments)    High PRU's - non-responder  . Ibuprofen Other (See Comments)    Pt states she is not supposed to take ibuprofen because of other meds she is taking  . Nsaids Other (See Comments)    "I do not take NSAIDs d/t interfering with other meds"

## 2020-02-21 ENCOUNTER — Telehealth: Payer: Self-pay | Admitting: Adult Health

## 2020-02-21 ENCOUNTER — Ambulatory Visit: Payer: Medicaid Other

## 2020-02-21 ENCOUNTER — Other Ambulatory Visit: Payer: Self-pay

## 2020-02-21 DIAGNOSIS — J449 Chronic obstructive pulmonary disease, unspecified: Secondary | ICD-10-CM

## 2020-02-21 MED ORDER — ANORO ELLIPTA 62.5-25 MCG/INH IN AEPB: 1.0000 | INHALATION_SPRAY | Freq: Every day | RESPIRATORY_TRACT | 5 refills | Status: DC

## 2020-02-21 MED ORDER — PROAIR HFA 108 (90 BASE) MCG/ACT IN AERS: INHALATION_SPRAY | RESPIRATORY_TRACT | 1 refills | Status: DC

## 2020-02-21 NOTE — Telephone Encounter (Signed)
Primary Pulmonologist: Dr.Olalere Last office visit and with whom: 08/03/19 Tammy P What do we see them for (pulmonary problems): COPD  Last OV assessment/plan:  Assessment and Plan: COPD exacerbation   Plan  Patient Instructions  Zpack take as directed.  Chest xray today .  Continue on ANORO 1 puff daily , rinse after use.  Duoneb every 6hr as needed for wheezing .  Continue on Oxygen 2l/m .  Continue on  BIPAP  At bedtime    Low salt diet .   Work on [employer]  Do not drive if sleepy.  Continue follow up with Cardiology as planned  Follow up with Dr. Hermina Staggers or Parrett NP  in 4-6 weeks  and As needed   Please contact office for sooner follow up if symptoms do not improve or worsen or seek emergency care        Follow Up Instructions: Follow up in 4 weeks and As needed   Please contact office for sooner follow up if symptoms do not improve or worsen or seek emergency care    I discussed the assessment and treatment plan with the patient. The patient was provided an opportunity to ask questions and all were answered. The patient agreed with the plan and demonstrated an understanding of the instructions.  The patient was advised to call back or seek an in-person evaluation if the symptoms worsen or if the condition fails to improve as anticipated.  I provided  30 minutes of non-face-to-face time during this encounter.   [name], NP      Patient Instructions by Melvenia Needles, NP at 08/03/2019 11:30 AM Author: Melvenia Needles, NP Author Type: Nurse Practitioner Filed: 08/03/2019 11:59 AM  Note Status: Addendum Cosign: Cosign Not Required Encounter Date: 08/03/2019  Editor: Melvenia Needles, NP (Nurse Practitioner)      Prior Versions: 1. Parrett, Fonnie Mu, NP (Nurse Practitioner) at 08/03/2019 11:59 AM - Addendum   2. Parrett, Fonnie Mu, NP (Nurse Practitioner) at 08/03/2019 11:56 AM - Addendum   3. Parrett, Fonnie Mu, NP (Nurse Practitioner) at 08/03/2019  11:55 AM - Signed    Zpack take as directed.  Chest xray today .  Continue on ANORO 1 puff daily , rinse after use.  Duoneb every 6hr as needed for wheezing .  Continue on Oxygen 2l/m .  Continue on  BIPAP  At bedtime    Low salt diet .   Work on [employer]  Do not drive if sleepy.  Continue follow up with Cardiology as planned  Follow up with Dr. Hermina Staggers or Parrett NP  in 4-6 weeks  and As needed   Please contact office for sooner follow up if symptoms do not improve or worsen or seek emergency care       Instructions    Return in about 4 weeks (around 08/31/2019). Zpack take as directed.  Chest xray today .  Continue on ANORO 1 puff daily , rinse after use.  Duoneb every 6hr as needed for wheezing .  Continue on Oxygen 2l/m .  Continue on  BIPAP  At bedtime    Low salt diet .   Work on [employer]  Do not drive if sleepy.  Continue follow up with Cardiology as planned  Follow up with Dr. Hermina Staggers or Parrett NP  in 4-6 weeks  and As needed   Please contact office for sooner follow up if symptoms do not improve or worsen or seek emergency care  Communications    [facility] Provider CC Chart Rep sent to Ladell Pier, MD   Chart Routed to Laurin Coder, MD Communication Routing History  Recipient Method Sent by Date Sent  Ladell Pier, MD In [facility] [place], Fonnie Mu, NP 08/03/2019     No questionnaires available.           Orders Placed   DG Chest 2 View Medication Changes    Azithromycin 250 MG Take 2 tablets (500 mg) on Day 1, followed by 1 tablet (250 mg) once daily on Days 2 through 5.   Medication List  Visit Diagnoses    COPD mixed type ([place])   Chronic obstructive pulmonary disease, unspecified COPD type ([place])   Obesity hypoventilation syndrome ([place])   Chronic combined systolic and diastolic heart failure ([place])   Problem List  Level of Service  Level of Service  LOS - NO CHARGE [NC1]  All Charges for This  Encounter  Code Description Service Date Service Provider Modifiers Qty  [phone] PR PHYS/QHP TELEPHONE EVALUATION 21-30 MIN 08/03/2019 Parrett, Fonnie Mu, NP CR, D[facility] 1     Was appointment offered to patient (explain)?     Reason for call:Cough with beige color mucus started on 02/19/20.Patient stated she is having her renal US tomorrow and was wondering if she can have a CXR done at the same time ?   Tammy can you please advise.  Thank you   Allergies  Allergen Reactions  . Metolazone Other (See Comments)    Dizziness and falling Per patient in 2021, states she does not think she fell because of this medication.  . Plavix [Clopidogrel Bisulfate] Other (See Comments)    High PRU's - non-responder  . Ibuprofen Other (See Comments)    Pt states she is not supposed to take ibuprofen because of other meds she is taking  . Nsaids Other (See Comments)    "I do not take NSAIDs d/t interfering with other meds"    Immunization History  Administered Date(s) Administered  . Influenza,inj,Quad PF,6+ Mos 02/08/2014, 05/22/2015, 06/13/2018, 01/19/2019  . PFIZER SARS-COV-2 Vaccination 11/14/2019, 12/07/2019  . Pneumococcal Polysaccharide-23 01/19/2019

## 2020-02-21 NOTE — Telephone Encounter (Signed)
Spoke with patient regarding prior message.Advised patient that per Tammy ok to have CXR done with patient has her Renal US. Patient was very upset because she was wanting a medication sent into her pharmacy. Patient stated she wanted to change providers. Advised patient she has a f/u with Tammy on 02/27/20. Patient's voice was understanding.Nothing else further neeeded.

## 2020-02-21 NOTE — Telephone Encounter (Signed)
Called and spoke with the patient, advised on recommendations per [name] NP.  Patient stated that she made an appointment for 9/29.  Offered a sooner appointment on 9/24 and 9/27, however, patient had to arrange transportation 3 days in advance and could not come earlier.  She asked about her anoro and proair inhaler being refilled, states her pharmacy never received the refills.  I called [facility] and verified they had not received the refill requests.  Refill requests sent again.  Nothing further needed.

## 2020-02-21 NOTE — Telephone Encounter (Signed)
Sorry to hear that she is not feeling well.  She has not had an office visit since March 2021 and that was a telemedicine visit.  She was called in a Z-Pak in July.  She will need to be seen within evaluation and possible chest x-ray.  If she is unable to get worked and will need to be seen at primary care or local urgent care for further evaluation  Please contact office for sooner follow up if symptoms do not improve or worsen or seek emergency care

## 2020-02-21 NOTE — Telephone Encounter (Signed)
That is fine to get CXR .  Please make sure she keeps follow up  .  Please contact office for sooner follow up if symptoms do not improve or worsen or seek emergency care

## 2020-02-22 ENCOUNTER — Ambulatory Visit
Admission: RE | Admit: 2020-02-22 | Discharge: 2020-02-22 | Disposition: A | Discharge status: Home / Self Care | Payer: Medicaid Other | Source: Ambulatory Visit | Attending: Nephrology | Admitting: Nephrology

## 2020-02-22 ENCOUNTER — Ambulatory Visit
Admission: RE | Admit: 2020-02-22 | Discharge: 2020-02-22 | Disposition: A | Discharge status: Home / Self Care | Payer: Medicaid Other | Source: Ambulatory Visit | Attending: Adult Health | Admitting: Adult Health

## 2020-02-22 DIAGNOSIS — N184 Chronic kidney disease, stage 4 (severe): Secondary | ICD-10-CM

## 2020-02-22 NOTE — Telephone Encounter (Signed)
[place]

## 2020-02-25 ENCOUNTER — Telehealth (HOSPITAL_COMMUNITY): Payer: Self-pay

## 2020-02-25 NOTE — Telephone Encounter (Signed)
Cardiac Rehab Telephone Note:  Successful telephone encounter to Tamara Silva to confirm Cardiac Rehab orientation appointment for 02/26/20 at 10:00. Unfortunately patient states she is not feeling well. C/O increased productive cough and "sinuses". Patient has sick appointment with pulmonology 02/27/20. Cardiac Rehab orientation appointment cancelled. Patient will be contacted to reschedule.    Durwin Davisson E. Rollene Rotunda RN, BSN [place]. [facility]  Cardiac and Pulmonary Rehabilitation Phone: [phone] Fax: [phone]

## 2020-02-26 ENCOUNTER — Ambulatory Visit (HOSPITAL_COMMUNITY): Payer: Medicaid Other

## 2020-02-26 ENCOUNTER — Telehealth: Payer: Self-pay | Admitting: Adult Health

## 2020-02-26 ENCOUNTER — Other Ambulatory Visit: Payer: Self-pay | Admitting: Adult Health

## 2020-02-26 MED ORDER — AMOXICILLIN-POT CLAVULANATE 875-125 MG PO TABS
1.0000 | ORAL_TABLET | Freq: Two times a day (BID) | ORAL | 0 refills | Status: DC
Start: 2020-02-26 — End: 2020-10-23

## 2020-02-26 NOTE — Progress Notes (Signed)
Patient contacted with results of recent chest x ray, notes from provider reviewed. Prescription for Augmentin sent to the preferred pharmacy. Patient verbalized understanding of results and plan of care.

## 2020-02-26 NOTE — Telephone Encounter (Signed)
Spoke with patient regarding prior message.Advised patient it is ok for patient to come for her visit on 02/27/20 per [place] .Pateint's voice was understanding.Nothing else further needed.

## 2020-02-27 ENCOUNTER — Ambulatory Visit: Payer: Medicaid Other | Admitting: Adult Health

## 2020-02-28 ENCOUNTER — Other Ambulatory Visit: Payer: Self-pay | Admitting: Internal Medicine

## 2020-02-28 ENCOUNTER — Telehealth (HOSPITAL_COMMUNITY): Payer: Self-pay

## 2020-02-28 DIAGNOSIS — E1142 Type 2 diabetes mellitus with diabetic polyneuropathy: Secondary | ICD-10-CM

## 2020-02-28 DIAGNOSIS — I25119 Atherosclerotic heart disease of native coronary artery with unspecified angina pectoris: Secondary | ICD-10-CM

## 2020-02-28 DIAGNOSIS — IMO0002 Reserved for concepts with insufficient information to code with codable children: Secondary | ICD-10-CM

## 2020-02-28 NOTE — Telephone Encounter (Signed)
Cardiac Rehab Note:  Successful telephone encounter to Tamara Silva to follow up on respiratory illness and failure to present to pulmonology appointment 02/27/20. Per Tamara Silva, she cancelled her sick appointment with Tamara Marker NP secondary to continuous cough and utilization of public transportation. Patient states she did not want to be coughing continuously and possibly infect others with her "pneumonia". Patient also states she recently had chest xray which provided diagnosis of pneumonia. Patient was prescribed Augmentin 02/26/20 per EMR review. Patient confirms receipt of medication and compliance. Patient states she did not want to reschedule cardiac rehab orientation at this time. She request follow up call in 2 to 3 weeks.  Tamara Swisher E. Rollene Rotunda RN, BSN [place]. [facility] [place]  Cardiac and Pulmonary Rehabilitation Phone: [phone] Fax: [phone]

## 2020-02-29 ENCOUNTER — Ambulatory Visit: Payer: Medicaid Other | Admitting: Pharmacist

## 2020-03-03 ENCOUNTER — Ambulatory Visit (HOSPITAL_COMMUNITY): Payer: Medicaid Other

## 2020-03-05 ENCOUNTER — Ambulatory Visit (HOSPITAL_COMMUNITY): Payer: Medicaid Other

## 2020-03-07 ENCOUNTER — Ambulatory Visit (HOSPITAL_COMMUNITY): Payer: Medicaid Other

## 2020-03-07 NOTE — Telephone Encounter (Signed)
Patient no showed for her sleep study on 02/19/20. I reached out to the patient to reschedule her and she states she is declining to do aother sleep study because she already has a Bipap and she already knows she has sleep apnea.

## 2020-03-09 NOTE — Telephone Encounter (Signed)
  She is noncompliant with her CPAP and told Dr. Aundra Dubin that she does not tolerate it.  She is not on BiPAP she is on CPAP and needs a titration to get on BiPAP to see if she will tolerate pressure better

## 2020-03-10 ENCOUNTER — Ambulatory Visit (HOSPITAL_COMMUNITY): Payer: Medicaid Other

## 2020-03-12 ENCOUNTER — Ambulatory Visit (HOSPITAL_COMMUNITY): Payer: Medicaid Other

## 2020-03-14 ENCOUNTER — Ambulatory Visit (HOSPITAL_COMMUNITY): Payer: Medicaid Other

## 2020-03-17 ENCOUNTER — Ambulatory Visit (HOSPITAL_COMMUNITY): Payer: Medicaid Other

## 2020-03-19 ENCOUNTER — Ambulatory Visit (HOSPITAL_COMMUNITY): Payer: Medicaid Other

## 2020-03-19 ENCOUNTER — Other Ambulatory Visit (HOSPITAL_COMMUNITY): Payer: Self-pay | Admitting: Cardiology

## 2020-03-21 ENCOUNTER — Ambulatory Visit (HOSPITAL_COMMUNITY): Payer: Medicaid Other

## 2020-03-24 ENCOUNTER — Ambulatory Visit (HOSPITAL_COMMUNITY): Payer: Medicaid Other

## 2020-03-25 ENCOUNTER — Other Ambulatory Visit (HOSPITAL_COMMUNITY): Payer: Self-pay | Admitting: Cardiology

## 2020-03-26 ENCOUNTER — Ambulatory Visit (HOSPITAL_COMMUNITY): Payer: Medicaid Other

## 2020-03-28 ENCOUNTER — Ambulatory Visit (HOSPITAL_COMMUNITY): Payer: Medicaid Other

## 2020-03-31 ENCOUNTER — Ambulatory Visit (HOSPITAL_COMMUNITY): Payer: Medicaid Other

## 2020-04-02 ENCOUNTER — Ambulatory Visit (HOSPITAL_COMMUNITY): Payer: Medicaid Other

## 2020-04-03 ENCOUNTER — Other Ambulatory Visit: Payer: Self-pay | Admitting: Endocrinology

## 2020-04-03 DIAGNOSIS — E1142 Type 2 diabetes mellitus with diabetic polyneuropathy: Secondary | ICD-10-CM

## 2020-04-03 DIAGNOSIS — IMO0002 Reserved for concepts with insufficient information to code with codable children: Secondary | ICD-10-CM

## 2020-04-04 ENCOUNTER — Ambulatory Visit (HOSPITAL_COMMUNITY): Payer: Medicaid Other

## 2020-04-07 ENCOUNTER — Telehealth (HOSPITAL_COMMUNITY): Payer: Self-pay

## 2020-04-07 ENCOUNTER — Ambulatory Visit (HOSPITAL_COMMUNITY): Payer: Medicaid Other

## 2020-04-07 NOTE — Telephone Encounter (Signed)
Called and spoke with pt in regards to rescheduling for CR, pt stated she is not interested at this time.   Closed referral 

## 2020-04-09 ENCOUNTER — Ambulatory Visit (HOSPITAL_COMMUNITY): Payer: Medicaid Other

## 2020-04-10 ENCOUNTER — Other Ambulatory Visit (HOSPITAL_COMMUNITY): Payer: Self-pay | Admitting: Cardiovascular Disease

## 2020-04-11 ENCOUNTER — Ambulatory Visit (HOSPITAL_COMMUNITY): Payer: Medicaid Other

## 2020-04-14 ENCOUNTER — Ambulatory Visit (HOSPITAL_COMMUNITY): Payer: Medicaid Other

## 2020-04-15 ENCOUNTER — Other Ambulatory Visit: Payer: Self-pay | Admitting: Hematology and Oncology

## 2020-04-16 ENCOUNTER — Ambulatory Visit (HOSPITAL_COMMUNITY): Payer: Medicaid Other

## 2020-04-18 ENCOUNTER — Ambulatory Visit (HOSPITAL_COMMUNITY): Payer: Medicaid Other

## 2020-04-21 ENCOUNTER — Other Ambulatory Visit (HOSPITAL_COMMUNITY): Payer: Self-pay | Admitting: Cardiology

## 2020-04-21 ENCOUNTER — Telehealth (HOSPITAL_COMMUNITY): Payer: Self-pay | Admitting: Internal Medicine

## 2020-04-21 ENCOUNTER — Ambulatory Visit (HOSPITAL_COMMUNITY): Payer: Medicaid Other

## 2020-04-21 MED ORDER — BISOPROLOL FUMARATE 5 MG PO TABS: 5.0000 mg | ORAL_TABLET | Freq: Every day | ORAL | 6 refills | Status: DC

## 2020-04-21 NOTE — Telephone Encounter (Signed)
[facility] request refill for bisoprolol 5 MG asap, please

## 2020-04-22 ENCOUNTER — Ambulatory Visit (INDEPENDENT_AMBULATORY_CARE_PROVIDER_SITE_OTHER): Payer: Medicaid Other

## 2020-04-22 DIAGNOSIS — I255 Ischemic cardiomyopathy: Secondary | ICD-10-CM

## 2020-04-22 LAB — CUP PACEART REMOTE DEVICE CHECK
Battery Remaining Longevity: 132 mo
Battery Voltage: 3.04 V
Brady Statistic RV Percent Paced: 0.02 %
Date Time Interrogation Session: 20211123012302
HighPow Impedance: 66 Ohm
Implantable Lead Implant Date: 20201104
Implantable Lead Location: 753860
Implantable Pulse Generator Implant Date: 20201104
Lead Channel Impedance Value: 323 Ohm
Lead Channel Impedance Value: 380 Ohm
Lead Channel Pacing Threshold Amplitude: 0.625 V
Lead Channel Pacing Threshold Pulse Width: 0.4 ms
Lead Channel Sensing Intrinsic Amplitude: 17 mV
Lead Channel Sensing Intrinsic Amplitude: 17 mV
Lead Channel Setting Pacing Amplitude: 2.5 V
Lead Channel Setting Pacing Pulse Width: 0.4 ms
Lead Channel Setting Sensing Sensitivity: 0.3 mV

## 2020-04-23 ENCOUNTER — Ambulatory Visit (HOSPITAL_COMMUNITY): Payer: Medicaid Other

## 2020-04-25 ENCOUNTER — Telehealth: Payer: Self-pay | Admitting: Adult Health

## 2020-04-25 ENCOUNTER — Other Ambulatory Visit (HOSPITAL_COMMUNITY): Payer: Self-pay | Admitting: Cardiology

## 2020-04-25 MED ORDER — ANORO ELLIPTA 62.5-25 MCG/INH IN AEPB: 1.0000 | INHALATION_SPRAY | Freq: Every day | RESPIRATORY_TRACT | 0 refills | Status: DC

## 2020-04-25 NOTE — Telephone Encounter (Signed)
Called and spoke with pt and she is aware of rx that has been sent to the pharmacy.  She had made an appt on 11/30 but we had to reschedule this due to her transportation.  Nothing further is needed.

## 2020-04-29 ENCOUNTER — Ambulatory Visit: Payer: Medicaid Other | Admitting: Adult Health

## 2020-04-29 NOTE — Progress Notes (Signed)
Remote ICD transmission.   

## 2020-05-05 ENCOUNTER — Other Ambulatory Visit: Payer: Self-pay | Admitting: Adult Health

## 2020-05-05 DIAGNOSIS — J449 Chronic obstructive pulmonary disease, unspecified: Secondary | ICD-10-CM

## 2020-05-06 ENCOUNTER — Ambulatory Visit: Payer: Medicaid Other | Admitting: Adult Health

## 2020-05-06 ENCOUNTER — Encounter: Payer: Self-pay | Admitting: Adult Health

## 2020-05-06 ENCOUNTER — Telehealth: Payer: Self-pay | Admitting: Adult Health

## 2020-05-06 ENCOUNTER — Ambulatory Visit (INDEPENDENT_AMBULATORY_CARE_PROVIDER_SITE_OTHER): Payer: Medicaid Other | Admitting: Adult Health

## 2020-05-06 DIAGNOSIS — G4733 Obstructive sleep apnea (adult) (pediatric): Secondary | ICD-10-CM

## 2020-05-06 NOTE — Patient Instructions (Addendum)
Continue on ANORO 1 puff daily , rinse after use.  Mucinex DM .Twice daily  As needed  Cough/congestion  ProAir inhaler or Duoneb every 6hr as needed for wheezing .  Continue on Oxygen 3  l/m .  Continue on  BIPAP  At bedtime    Low salt diet .   Work on [employer] loss  Refer to healthy weight and wellness center.  Do not drive if sleepy.  Follow up with Dr. Hermina Staggers or Jacquel Redditt NP  in 3 months and As needed   Please contact office for sooner follow up if symptoms do not improve or worsen or seek emergency care

## 2020-05-06 NOTE — Telephone Encounter (Signed)
Spoke with Tammy and verified it is ok to change visit to televisit.  Called and spoke with patient and advised her that her appointment is changed to a televisit.  Verified that we can reach her at [phone].  She verbalized understanding.  Nothing further needed.

## 2020-05-06 NOTE — Telephone Encounter (Signed)
Called and spoke with patient, she wants to change her office visit to a televisit.  She states she is not having any symptoms and she has to wait about an hour after her visit for transportation and it is very cold today.  Tammy, Please advise if it is ok to change her visit to a televisit.  Thank you.

## 2020-05-06 NOTE — Progress Notes (Signed)
Virtual Visit via Telephone Note  I connected with Tamara Silva on 05/06/20 at  2:00 PM EST by telephone and verified that I am speaking with the correct person using two identifiers.  Location: Patient: Home  Provider: Office    I discussed the limitations, risks, security and privacy concerns of performing an evaluation and management service by telephone and the availability of in person appointments. I also discussed with the patient that there may be a patient responsible charge related to this service. The patient expressed understanding and agreed to proceed.   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  Patient has underlying COPD and OSA/OHS.,  Chronic respiratory failure on oxygen. Patient said overall she is doing okay.  She gets winded with heavy activity.  But has had no flare of her cough or wheezing.  She remains on Anoro daily.  She uses oxygen at 3 L.  She does wear her BiPAP at bedtime.  Patient says she is trying to lose weight but has a hard time losing weight.  We discussed a healthy diet.  She would like a referral to the weight loss center to help with this. Flu shot and Covid vaccine is up-to-date.  Patient plans to get her Covid booster next month.   Past Medical History:  Diagnosis Date  . AICD (automatic cardioverter/defibrillator) present 04/04/2019  . Asthma   . CAD (coronary artery disease)   . Chronic combined systolic (congestive) and diastolic (congestive) heart failure ([place]   . Chronic iron deficiency anemia   . CVA (cerebral vascular accident) ([place] 05/2018  . Diabetes mellitus without complication ([place]   . DOE (dyspnea on exertion)   . Edema of both legs   . Hepatic steatosis   .  Hypertension   . Pulmonary emboli ([facility] 2014  . Respiratory failure ([place] 09/2018  . Seizure ([facility] 05/2018    Current Outpatient Medications on File Prior to Visit  Medication Sig Dispense Refill  . acetaminophen (TYLENOL) 500 MG tablet Take 500 mg by mouth every 6 (six) hours as needed for moderate pain or headache.    .[name] Kitchenamoxicillin-clavulanate (AUGMENTIN) 875-125 MG tablet Take 1 tablet by mouth 2 (two) times daily. 14 tablet 0  . ascorbic acid (VITAMIN C) 500 MG tablet TAKE ONE TABLET BY MOUTH ONCE DAILY 90 tablet 3  . ASPIRIN LOW DOSE 81 MG EC tablet CHEW 1 TABLET (81 MG TOTAL) BY MOUTH DAILY. 90 tablet 3  . bisoprolol (ZEBETA) 5 MG tablet TAKE 1 TABLET (5 MG TOTAL) BY MOUTH DAILY. 30 tablet 6  . Blood Glucose Monitoring Suppl (ACCU-CHEK GUIDE) w/Device KIT Use accu chek guide to check blood sugar three times daily. 1 kit 0  . ferrous sulfate 325 (65 FE) MG tablet Take 1 tablet (325 mg total) by mouth daily after supper. Please take with a source of vitamin C 90 tablet 3  . fluticasone (FLONASE) 50 MCG/ACT nasal spray PLACE 1 SPRAY INTO BOTH NOSTRILS DAILY. 16 g 1  . glucose blood (ACCU-CHEK GUIDE) test strip USE AS INSTRUCTED TO CHECK BLOOD SUGAR THREE TIMES DAILY. 100 each 2  . hydrocerin (EUCERIN) CREA Apply 1 application topically 2 (two) times daily.  0  . insulin degludec (TRESIBA FLEXTOUCH) 200 UNIT/ML FlexTouch Pen INJECT 80 UNITS UNDER THE  SKIN ONCE DAILY  LAST FOR 45 DAYS 18 mL 3  . Insulin Pen Needle (EASY COMFORT PEN NEEDLES) 31G X 5 MM MISC USE 3 TIMES A DAY FOR INSULIN ADMINISTRATION 100 each 12  . ipratropium-albuterol (DUONEB) 0.5-2.5 (3) MG/3ML SOLN Use nebulizer to inhale contents of one vial every 6 hours as needed for shortness of breath, wheezing 360 mL 11  . isosorbide-hydrALAZINE (BIDIL) 20-37.5 MG tablet Take 1 tablet by mouth 3 (three) times daily. 270 tablet 3  . metolazone (ZAROXOLYN) 2.5 MG tablet TAKE 1 TABLET (2.5 MG TOTAL) BY MOUTH ONCE A WEEK. WED 4 tablet 3   . NEEDLE, DISP, 26 G 26G X 1/2" MISC 1 application by Does not apply route daily. 100 each 0  . NOVOLOG FLEXPEN 100 UNIT/ML FlexPen Inject 20 units under the skin at breakfast and lunch, and 24 units at dinner. 30 mL 4  . omeprazole (PRILOSEC) 40 MG capsule TAKE 1 CAPSULE (40 MG TOTAL) BY MOUTH DAILY. 90 capsule 1  . potassium chloride SA (KLOR-CON) 20 MEQ tablet TAKE 2 TABLETS (40 MEQ TOTAL) BY MOUTH DAILY. 90 tablet 1  . PROAIR HFA 108 (90 Base) MCG/ACT inhaler INHALE 2 PUFFS BY MOUTH 4 TIMES DAILY AS NEEDED 8.5 g 3  . RESTASIS 0.05 % ophthalmic emulsion Place 1 drop into both eyes daily as needed.     . rosuvastatin (CRESTOR) 40 MG tablet TAKE 1 TABLET (40 MG TOTAL) BY MOUTH DAILY AT 6 PM. 30 tablet 3  . sacubitril-valsartan (ENTRESTO) 24-26 MG Take 1 tablet by mouth 2 (two) times daily. 60 tablet 5  . torsemide (DEMADEX) 100 MG tablet TAKE 1 TABLET (100 MG TOTAL) BY MOUTH 2 (TWO) TIMES DAILY. 60 tablet 3  . umeclidinium-vilanterol (ANORO ELLIPTA) 62.5-25 MCG/INH AEPB Inhale 1 puff into the lungs daily. 30 each 0  . VICTOZA 18 MG/3ML SOPN INJECT 1.8 MG INTO THE SKIN DAILY. 9 mL 2  . vitamin B-12 (CYANOCOBALAMIN) 1000 MCG tablet Take 1,000 mcg by mouth daily.    . Vitamin D, Ergocalciferol, (DRISDOL) 1.25 MG (50000 UNIT) CAPS capsule Take 50,000 Units by mouth once a week.     No current facility-administered medications on file prior to visit.       Observations/Objective: Speaks in full sentences with no audible distress  Assessment and Plan: COPD stable on current regimen  OSA/OHS continue on BiPAP  Chronic respiratory failure on oxygen continue on oxygen at 3 L to keep O2 saturations greater than 88 to 90%.  Morbid obesity continue with healthy diet and weight loss.  Referral to the healthy weight and wellness center.  Plan  Patient Instructions  Continue on ANORO 1 puff daily , rinse after use.  Mucinex DM .Twice daily  As needed  Cough/congestion  ProAir inhaler or Duoneb  every 6hr as needed for wheezing .  Continue on Oxygen 3  l/m .  Continue on  BIPAP  At bedtime    Low salt diet .   Work on [employer] loss  Refer to healthy weight and wellness center.  Do not drive if sleepy.  Follow up with Dr. Hermina Staggers or Deovion Batrez NP  in 3 months and As needed   Please contact office for sooner follow up if symptoms do not improve or worsen or seek emergency care        Follow Up Instructions: Follow-up in 3 months and as needed   I discussed the assessment and treatment plan with the patient. The patient was provided  an opportunity to ask questions and all were answered. The patient agreed with the plan and demonstrated an understanding of the instructions.   The patient was advised to call back or seek an in-person evaluation if the symptoms worsen or if the condition fails to improve as anticipated.  I provided 22 minutes of non-face-to-face time during this encounter.   [name], NP

## 2020-05-14 LAB — HM DIABETES EYE EXAM

## 2020-05-19 ENCOUNTER — Telehealth (HOSPITAL_COMMUNITY): Payer: Self-pay | Admitting: Cardiology

## 2020-05-19 NOTE — Telephone Encounter (Signed)
Patient called to request referral to [facility]-Dr Posey Pronto for heart transplant  Reports she has noticed functional decline and would like to do something to help herself. Reports she knows her heart is bad and doesn't want to wait until the last minute to do something about it. Advised these discussions are best had in person as I am not fully aware she meets the criteria for referral.  Next available 12/27 @ 220 given to pt Appreciative of returned call

## 2020-05-22 ENCOUNTER — Other Ambulatory Visit: Payer: Self-pay | Admitting: Internal Medicine

## 2020-05-22 NOTE — Telephone Encounter (Signed)
   Notes to clinic:  Medication has been filled by a different provider Review for refills   Requested Prescriptions  Pending Prescriptions Disp Refills   ACCU-CHEK AVIVA PLUS test strip [Pharmacy Med Name: [facility] 100 each 2    Sig: USE 1 STRIP TO CHECK BLOOD GLUCOSE 3 TIMES A DAY      Endocrinology: Diabetes - Testing Supplies Passed - 05/22/2020 10:09 AM      Passed - Valid encounter within last 12 months    Recent Outpatient Visits           3 months ago Type 2 diabetes mellitus with diabetic polyneuropathy, with long-term current use of insulin ([place])   [place] Oelrichs, Neoma Laming B, MD   6 months ago Chronic systolic CHF (congestive heart failure) [facility])   [place], MD   9 months ago Chronic systolic CHF (congestive heart failure) [facility])   [place] Ladell Pier, MD   1 year ago Uncontrolled type 2 diabetes mellitus with peripheral neuropathy [facility])   [place] Ladell Pier, MD   1 year ago Encounter for medication counseling   [place], RPH-CPP

## 2020-05-26 ENCOUNTER — Encounter (HOSPITAL_COMMUNITY): Payer: Medicaid Other | Admitting: Cardiology

## 2020-06-04 ENCOUNTER — Other Ambulatory Visit (HOSPITAL_COMMUNITY): Payer: Self-pay | Admitting: Cardiology

## 2020-06-12 ENCOUNTER — Other Ambulatory Visit: Payer: Self-pay | Admitting: Internal Medicine

## 2020-06-12 DIAGNOSIS — E1142 Type 2 diabetes mellitus with diabetic polyneuropathy: Secondary | ICD-10-CM

## 2020-06-12 DIAGNOSIS — IMO0002 Reserved for concepts with insufficient information to code with codable children: Secondary | ICD-10-CM

## 2020-06-12 DIAGNOSIS — E1165 Type 2 diabetes mellitus with hyperglycemia: Secondary | ICD-10-CM

## 2020-06-17 ENCOUNTER — Other Ambulatory Visit: Payer: Self-pay | Admitting: Hematology and Oncology

## 2020-06-18 ENCOUNTER — Telehealth (HOSPITAL_COMMUNITY): Payer: Self-pay | Admitting: Pharmacist

## 2020-06-18 NOTE — Telephone Encounter (Signed)
Patient Advocate Encounter   Received notification from [employer] that prior authorization for Tamara Silva is required.   PA submitted on [facility] Tracks Confirmation #: [id number] Recipient ID: [id number] P  Status is pending   Will continue to follow.  Audry Riles, PharmD, BCPS, BCCP, CPP Heart Failure Clinic Pharmacist [phone]

## 2020-06-19 ENCOUNTER — Telehealth: Payer: Self-pay | Admitting: Hematology and Oncology

## 2020-06-19 NOTE — Telephone Encounter (Signed)
Scheduled appointment per 1/19 sch msg. I called patient, who seemed very confused why I was scheduling an appointment. I told her she saw Dr. Lorenso Courier in July and she said she vaguely remembers that. She asked me to mail the appointment to her instead of telling her over the phone. I mailed updated calendar to patient today.

## 2020-06-20 ENCOUNTER — Encounter (HOSPITAL_COMMUNITY): Payer: Medicaid Other | Admitting: Cardiology

## 2020-06-23 ENCOUNTER — Other Ambulatory Visit: Payer: Self-pay | Admitting: Endocrinology

## 2020-06-23 DIAGNOSIS — E1142 Type 2 diabetes mellitus with diabetic polyneuropathy: Secondary | ICD-10-CM

## 2020-06-23 DIAGNOSIS — IMO0002 Reserved for concepts with insufficient information to code with codable children: Secondary | ICD-10-CM

## 2020-06-23 DIAGNOSIS — E1165 Type 2 diabetes mellitus with hyperglycemia: Secondary | ICD-10-CM

## 2020-06-23 NOTE — Telephone Encounter (Signed)
Advanced Heart Failure Patient Advocate Encounter  Prior Authorization for Delene Loll has been approved.    PA# [id number] Effective dates: 06/18/2020 - 06/13/2021  Audry Riles, PharmD, BCPS, BCCP, CPP Heart Failure Clinic Pharmacist [phone]

## 2020-06-26 ENCOUNTER — Other Ambulatory Visit: Payer: Self-pay | Admitting: Internal Medicine

## 2020-06-26 DIAGNOSIS — K219 Gastro-esophageal reflux disease without esophagitis: Secondary | ICD-10-CM

## 2020-07-09 ENCOUNTER — Other Ambulatory Visit: Payer: Self-pay | Admitting: Hematology and Oncology

## 2020-07-09 DIAGNOSIS — D500 Iron deficiency anemia secondary to blood loss (chronic): Secondary | ICD-10-CM

## 2020-07-10 ENCOUNTER — Inpatient Hospital Stay: Payer: Medicaid Other

## 2020-07-10 ENCOUNTER — Inpatient Hospital Stay: Payer: Medicaid Other | Attending: Hematology and Oncology | Admitting: Hematology and Oncology

## 2020-07-15 ENCOUNTER — Other Ambulatory Visit: Payer: Self-pay | Admitting: Internal Medicine

## 2020-07-15 ENCOUNTER — Telehealth: Payer: Self-pay | Admitting: Adult Health

## 2020-07-15 ENCOUNTER — Other Ambulatory Visit (HOSPITAL_COMMUNITY): Payer: Self-pay | Admitting: Cardiovascular Disease

## 2020-07-15 DIAGNOSIS — I25119 Atherosclerotic heart disease of native coronary artery with unspecified angina pectoris: Secondary | ICD-10-CM

## 2020-07-15 NOTE — Telephone Encounter (Signed)
Called and spoke with pt and she stated that the dx that she got for her lungs, she wanted to know if there was anything that could fix her lungs like surgery or anything.  Pt stated that she is wanting to be able to be more mobile and be able to go to church.  TP please advise. Thanks

## 2020-07-15 NOTE — Telephone Encounter (Signed)
Called patient needs ov in office ,  Please set up next available with PFT and Chest xray with myself.

## 2020-07-16 NOTE — Telephone Encounter (Signed)
ATC patient, she stated she had just started eating and had not eaten all day.  I let her know I would call her back later.  She verbalized understanding.

## 2020-07-18 NOTE — Telephone Encounter (Signed)
Called and spoke with patient, she stated she did not want to be covid tested, she had been covid tested 4 times and it had been negative.  She has received all of her covid vaccines and does not want anyone sticking anything up her nose again.  Advised that because covid is on the rise, we do have to have all of our patients covid tested that need a covid test.  At this time, she declines to have the test done.  At some point, she spoke with someone and scheduled an appointment with TP for 07/24/20.  CXR ordered prior to visit.  Nothing further needed.  Tammy, Just an FYI, this patient does not want to be covid tested and is declining to have a PFT done at this time for that reason.  She is scheduled to see you on 07/24/20 with a CXR prior to appointment.  Thank you.

## 2020-07-22 ENCOUNTER — Ambulatory Visit (INDEPENDENT_AMBULATORY_CARE_PROVIDER_SITE_OTHER): Payer: Medicaid Other

## 2020-07-22 ENCOUNTER — Telehealth (HOSPITAL_COMMUNITY): Payer: Self-pay | Admitting: Pharmacy Technician

## 2020-07-22 ENCOUNTER — Other Ambulatory Visit: Payer: Self-pay | Admitting: Adult Health

## 2020-07-22 DIAGNOSIS — I469 Cardiac arrest, cause unspecified: Secondary | ICD-10-CM | POA: Diagnosis not present

## 2020-07-22 DIAGNOSIS — J449 Chronic obstructive pulmonary disease, unspecified: Secondary | ICD-10-CM

## 2020-07-22 LAB — CUP PACEART REMOTE DEVICE CHECK
Battery Remaining Longevity: 130 mo
Battery Voltage: 3.03 V
Brady Statistic RV Percent Paced: 0.02 %
Date Time Interrogation Session: 20220222042403
HighPow Impedance: 65 Ohm
Implantable Lead Implant Date: 20201104
Implantable Lead Location: 753860
Implantable Pulse Generator Implant Date: 20201104
Lead Channel Impedance Value: 304 Ohm
Lead Channel Impedance Value: 380 Ohm
Lead Channel Pacing Threshold Amplitude: 0.625 V
Lead Channel Pacing Threshold Pulse Width: 0.4 ms
Lead Channel Sensing Intrinsic Amplitude: 15.625 mV
Lead Channel Sensing Intrinsic Amplitude: 15.625 mV
Lead Channel Setting Pacing Amplitude: 2.5 V
Lead Channel Setting Pacing Pulse Width: 0.4 ms
Lead Channel Setting Sensing Sensitivity: 0.3 mV

## 2020-07-22 NOTE — Telephone Encounter (Signed)
Advanced Heart Failure Patient Advocate Encounter  Prior Authorization for Bisoprolol 5mg  has been submitted and approved.    PA# [id number] Effective dates: 07/22/20 through 07/22/21  Patients co-pay is $3  Charlann Boxer, CPhT

## 2020-07-23 ENCOUNTER — Telehealth: Payer: Self-pay | Admitting: Adult Health

## 2020-07-23 ENCOUNTER — Telehealth: Payer: Self-pay

## 2020-07-23 NOTE — Telephone Encounter (Signed)
Left detailed message for Tamara Silva to see if we can get a download for patient's machine.

## 2020-07-23 NOTE — Telephone Encounter (Signed)
Left voicemail for patient to bring SD card from CPAP machine to visit tomorrow. Nothing further needed.  

## 2020-07-24 ENCOUNTER — Encounter: Payer: Self-pay | Admitting: Adult Health

## 2020-07-24 ENCOUNTER — Other Ambulatory Visit: Payer: Self-pay

## 2020-07-24 ENCOUNTER — Ambulatory Visit: Payer: Medicaid Other | Admitting: Adult Health

## 2020-07-24 ENCOUNTER — Ambulatory Visit (INDEPENDENT_AMBULATORY_CARE_PROVIDER_SITE_OTHER): Payer: Medicaid Other

## 2020-07-24 DIAGNOSIS — J449 Chronic obstructive pulmonary disease, unspecified: Secondary | ICD-10-CM

## 2020-07-24 DIAGNOSIS — J9611 Chronic respiratory failure with hypoxia: Secondary | ICD-10-CM

## 2020-07-24 DIAGNOSIS — G4733 Obstructive sleep apnea (adult) (pediatric): Secondary | ICD-10-CM | POA: Diagnosis not present

## 2020-07-24 DIAGNOSIS — I5042 Chronic combined systolic (congestive) and diastolic (congestive) heart failure: Secondary | ICD-10-CM

## 2020-07-24 DIAGNOSIS — E662 Morbid (severe) obesity with alveolar hypoventilation: Secondary | ICD-10-CM

## 2020-07-24 DIAGNOSIS — J9612 Chronic respiratory failure with hypercapnia: Secondary | ICD-10-CM

## 2020-07-24 MED ORDER — STIOLTO RESPIMAT 2.5-2.5 MCG/ACT IN AERS: 2.0000 | INHALATION_SPRAY | Freq: Every day | RESPIRATORY_TRACT | 5 refills | Status: DC

## 2020-07-24 NOTE — Assessment & Plan Note (Signed)
Excellent control on TRILOGY   Plan  Patient Instructions  Stop ANORO  Begin Stiolto 2 puffs daily, rinse after use  Mucinex DM .Twice daily  As needed  Cough/congestion  ProAir inhaler or Duoneb every 6hr as needed for wheezing .  Continue on Oxygen 3  l/m .  Continue on  TRILOGY   At bedtime    Low salt diet .   Work on [employer] loss  Do not drive if sleepy.  Follow up with Dr. Hermina Staggers or Andru Genter NP  in 3-4 months and As needed   Please contact office for sooner follow up if symptoms do not improve or worsen or seek emergency care

## 2020-07-24 NOTE — Assessment & Plan Note (Signed)
Cont on Trilogy At bedtime

## 2020-07-24 NOTE — Patient Instructions (Signed)
Stop ANORO  Begin Stiolto 2 puffs daily, rinse after use  Mucinex DM .Twice daily  As needed  Cough/congestion  ProAir inhaler or Duoneb every 6hr as needed for wheezing .  Continue on Oxygen 3  l/m .  Continue on  TRILOGY   At bedtime    Low salt diet .   Work on [employer] loss  Do not drive if sleepy.  Follow up with Dr. Hermina Staggers or Mahli Glahn NP  in 3-4 months and As needed   Please contact office for sooner follow up if symptoms do not improve or worsen or seek emergency care

## 2020-07-24 NOTE — Progress Notes (Signed)
_0  ID: Tamara Silva, female    DOB: Jul 17, 1960, 60 y.o.   MRN: [id number]  Chief Complaint  Patient presents with  . Follow-up    Referring provider: Ladell Pier, MD  HPI: 60 year old female former smoker followed for COPD, obstructive sleep apnea, obesity hypoventilation syndrome on nocturnal BiPAP, chronic respiratory failure on oxygen Medical history significant for coronary artery disease, congestive heart failure, PEA arrest in 2020 status post ICD implant, ischemic CVA complicated by seizures.,  Chronic kidney disease  TEST/EVENTS :  Sleep study done 11/2014 showed Mild OSA with AHI at 5.7, mild desats ~82%.   Spirometry 12/2014 showed FEV1 at 34%, ratio 66.   2D echo October 2020 EF 30 to 35%  07/24/2020 Follow up : COPD, OSA/OHS on TRILOGY, chronic respiratory failure on oxygen Patient returns for a 67-monthfollow-up.  Patient has underlying severe COPD.  Is maintained on oxygen.  She remains on Anoro daily.Wants to try a different inhaler that is not dry powder.  Says overall breathing is doing okay with no flare of cough /congestion .  She was supposed to have pulmonary function testing today but declined due to refusal of COVID-19 testing Gets winded with minimal activity . Has sedentary lifestyle. Limited from back issues. Uses wheelchair most of time. Limited walking.  She is on oxygen 3 L.  She has had no increased oxygen demands.  Patient has obstructive sleep apnea and OHS.  She remains on TRILOGY at bedtime and with naps.  Says that she is wearing her TRILOGY Vent  each night.  Download shows 100%compliance with avg daily usage at 5hrs.   Covid vaccine x 3 utd.    Allergies  Allergen Reactions  . Metolazone Other (See Comments)    Dizziness and falling Per patient in 2021, states she does not think she fell because of this medication.  . Plavix [Clopidogrel Bisulfate] Other (See Comments)    High PRU's - non-responder  . Ibuprofen Other (See  Comments)    Pt states she is not supposed to take ibuprofen because of other meds she is taking  . Nsaids Other (See Comments)    "I do not take NSAIDs d/t interfering with other meds"    Immunization History  Administered Date(s) Administered  . Influenza,inj,Quad PF,6+ Mos 02/08/2014, 05/22/2015, 06/13/2018, 01/19/2019  . PFIZER(Purple Top)SARS-COV-2 Vaccination 11/14/2019, 12/07/2019, 05/14/2020  . Pneumococcal Polysaccharide-23 01/19/2019    Past Medical History:  Diagnosis Date  . AICD (automatic cardioverter/defibrillator) present 04/04/2019  . Asthma   . CAD (coronary artery disease)   . Chronic combined systolic (congestive) and diastolic (congestive) heart failure ([place]   . Chronic iron deficiency anemia   . CVA (cerebral vascular accident) ([place] 05/2018  . Diabetes mellitus without complication ([place]   . DOE (dyspnea on exertion)   . Edema of both legs   . Hepatic steatosis   . Hypertension   . Pulmonary [place]oli ([place] 2014  . Respiratory failure ([place] 09/2018  . Seizure ([place] 05/2018    Tobacco History: Social History   Tobacco Use  Smoking Status Former Smoker  . Packs/day: 0.50  . Years: 20.00  . Pack years: 10.00  . Types: Cigarettes  . Quit date: 05/31/2018  . Years since quitting: 2.1  Smokeless Tobacco Never Used  Tobacco Comment   smoked off and on x 20 years   Counseling given: Not Answered Comment: smoked off and on x 20 years   Outpatient Medications Prior to Visit  Medication Sig Dispense Refill  .  acetaminophen (TYLENOL) 500 MG tablet Take 500 mg by mouth every 6 (six) hours as needed for moderate pain or headache.    [name] Kitchen ascorbic acid (VITAMIN C) 500 MG tablet TAKE ONE TABLET BY MOUTH ONCE DAILY 90 tablet 3  . ASPIRIN LOW DOSE 81 MG EC tablet CHEW 1 TABLET (81 MG TOTAL) BY MOUTH DAILY. 90 tablet 3  . bisoprolol (ZEBETA) 5 MG tablet TAKE 1 TABLET (5 MG TOTAL) BY MOUTH DAILY. 30 tablet 6  . Blood Glucose Monitoring Suppl (ACCU-CHEK GUIDE)  w/Device KIT Use accu chek guide to check blood sugar three times daily. 1 kit 0  . ENTRESTO 24-26 MG TAKE 1 TABLET BY MOUTH 2 (TWO) TIMES DAILY. 60 tablet 5  . FEROSUL 325 (65 Fe) MG tablet TAKE 1 TABLET (325 MG TOTAL) BY MOUTH DAILY AFTER SUPPER. PLEASE TAKE WITH A SOURCE OF VITAMIN C 90 tablet 3  . fluticasone (FLONASE) 50 MCG/ACT nasal spray PLACE 1 SPRAY INTO BOTH NOSTRILS DAILY. 16 g 1  . glucose blood (ACCU-CHEK AVIVA PLUS) test strip Use 1 strip to check blood sugar TID. Must have office visit for refills 100 each 0  . hydrocerin (EUCERIN) CREA Apply 1 application topically 2 (two) times daily.  0  . insulin degludec (TRESIBA FLEXTOUCH) 200 UNIT/ML FlexTouch Pen INJECT 80 UNITS UNDER THE SKIN ONCE DAILY  LAST FOR 45 DAYS 18 mL 3  . Insulin Pen Needle (EASY COMFORT PEN NEEDLES) 31G X 5 MM MISC USE 3 TIMES A DAY FOR INSULIN ADMINISTRATION 100 each 12  . ipratropium-albuterol (DUONEB) 0.5-2.5 (3) MG/3ML SOLN INHALE CONTENTS OF ONE VIAL USING NEBULIZER EVERY 6 HOURS AS NEEDED FOR SHORTNESS OF BREATH, WHEEZING 360 mL 11  . isosorbide-hydrALAZINE (BIDIL) 20-37.5 MG tablet Take 1 tablet by mouth 3 (three) times daily. 270 tablet 3  . NEEDLE, DISP, 26 G 26G X 1/2" MISC 1 application by Does not apply route daily. 100 each 0  . NOVOLOG FLEXPEN 100 UNIT/ML FlexPen Inject 20 units under the skin at breakfast and lunch, and 24 units at dinner. 30 mL 4  . omeprazole (PRILOSEC) 40 MG capsule TAKE 1 CAPSULE (40 MG TOTAL) BY MOUTH DAILY. 90 capsule 1  . potassium chloride SA (KLOR-CON) 20 MEQ tablet TAKE 2 TABLETS (40 MEQ TOTAL) BY MOUTH DAILY. 90 tablet 1  . PROAIR HFA 108 (90 Base) MCG/ACT inhaler INHALE 2 PUFFS BY MOUTH 4 TIMES DAILY AS NEEDED 8.5 g 3  . RESTASIS 0.05 % ophthalmic emulsion Place 1 drop into both eyes daily as needed.     . rosuvastatin (CRESTOR) 40 MG tablet TAKE 1 TABLET (40 MG TOTAL) BY MOUTH DAILY AT 6 PM. 90 tablet 0  . torsemide (DEMADEX) 100 MG tablet TAKE 1 TABLET (100 MG TOTAL)  BY MOUTH 2 (TWO) TIMES DAILY. 60 tablet 3  . VICTOZA 18 MG/3ML SOPN INJECT 1.8 MG INTO THE SKIN DAILY. 9 mL 2  . vitamin B-12 (CYANOCOBALAMIN) 1000 MCG tablet Take 1,000 mcg by mouth daily.    . Vitamin D, Ergocalciferol, (DRISDOL) 1.25 MG (50000 UNIT) CAPS capsule Take 50,000 Units by mouth once a week.    . umeclidinium-vilanterol (ANORO ELLIPTA) 62.5-25 MCG/INH AEPB Inhale 1 puff into the lungs daily. 30 each 0  . amoxicillin-clavulanate (AUGMENTIN) 875-125 MG tablet Take 1 tablet by mouth 2 (two) times daily. (Patient not taking: Reported on 07/24/2020) 14 tablet 0  . metolazone (ZAROXOLYN) 2.5 MG tablet TAKE 1 TABLET (2.5 MG TOTAL) BY MOUTH ONCE A WEEK. WED 4  tablet 3   No facility-administered medications prior to visit.     Review of Systems:   Constitutional:   No  weight loss, night sweats,  Fevers, chills, fatigue, or  lassitude.  HEENT:   No headaches,  Difficulty swallowing,  Tooth/dental problems, or  Sore throat,                No sneezing, itching, ear ache, nasal congestion, post nasal drip,   CV:  No chest pain,  Orthopnea, PND, swelling in lower extremities, anasarca, dizziness, palpitations, syncope.   GI  No heartburn, indigestion, abdominal pain, nausea, vomiting, diarrhea, change in bowel habits, loss of appetite, bloody stools.   Resp: No shortness of breath with exertion or at rest.  No excess mucus, no productive cough,  No non-productive cough,  No coughing up of blood.  No change in color of mucus.  No wheezing.  No chest wall deformity  Skin: no rash or lesions.  GU: no dysuria, change in color of urine, no urgency or frequency.  No flank pain, no hematuria   MS:  No joint pain or swelling.  No decreased range of motion.  No back pain.    Physical Exam  BP 122/84 (BP Location: Left Arm, Cuff Size: Large)   Pulse 68   Temp 97.8 F (36.6 C)   Ht 5' 7" (1.702 m)   Wt (!) 349 lb (158.3 kg)   LMP 07/28/2013 Comment: perimenopausal  SpO2 94%   BMI 54.66  kg/m   GEN: A/Ox3; pleasant , NAD, well nourished    HEENT:  [place]/AT,  EACs-clear, TMs-wnl, NOSE-clear, THROAT-clear, no lesions, no postnasal drip or exudate noted.   NECK:  Supple w/ fair ROM; no JVD; normal carotid impulses w/o bruits; no thyromegaly or nodules palpated; no lymphadenopathy.    RESP  Clear  P & A; w/o, wheezes/ rales/ or rhonchi. no accessory muscle use, no dullness to percussion  CARD:  RRR, no m/r/g, no peripheral edema, pulses intact, no cyanosis or clubbing.  GI:   Soft & nt; nml bowel sounds; no organomegaly or masses detected.   Musco: Warm bil, no deformities or joint swelling noted.   Neuro: alert, no focal deficits noted.    Skin: Warm, no lesions or rashes    Lab Results:  CBC    Component Value Date/Time   WBC 8.5 12/20/2019 0810   WBC 7.9 11/06/2019 0745   RBC 3.77 (L) 12/20/2019 0810   RBC 3.73 (L) 12/20/2019 0809   HGB 9.7 (L) 12/20/2019 0810   HGB 11.0 (L) 03/19/2019 1218   HCT 31.9 (L) 12/20/2019 0810   HCT 35.7 03/19/2019 1218   PLT 255 12/20/2019 0810   PLT 260 03/19/2019 1218   MCV 84.6 12/20/2019 0810   MCV 87 03/19/2019 1218   MCH 25.7 (L) 12/20/2019 0810   MCHC 30.4 12/20/2019 0810   RDW 15.3 12/20/2019 0810   RDW 12.7 03/19/2019 1218   LYMPHSABS 2.0 12/20/2019 0810   LYMPHSABS 3.4 (H) 11/30/2018 1346   MONOABS 0.6 12/20/2019 0810   EOSABS 0.3 12/20/2019 0810   EOSABS 0.2 11/30/2018 1346   BASOSABS 0.0 12/20/2019 0810   BASOSABS 0.1 11/30/2018 1346    BMET    Component Value Date/Time   NA 143 12/25/2019 1157   NA 142 03/19/2019 1218   K 4.5 12/25/2019 1157   CL 101 12/25/2019 1157   CO2 33 (H) 12/25/2019 1157   GLUCOSE 99 12/25/2019 1157   BUN 68 (H)  12/25/2019 1157   BUN 30 (H) 03/19/2019 1218   CREATININE 2.52 (H) 12/25/2019 1157   CREATININE 2.34 (H) 12/20/2019 0810   CREATININE 0.89 07/08/2015 1645   CALCIUM 9.1 12/25/2019 1157   GFRNONAA 20 (L) 12/25/2019 1157   GFRNONAA 22 (L) 12/20/2019 0810    GFRNONAA 73 07/08/2015 1645   GFRAA 23 (L) 12/25/2019 1157   GFRAA 25 (L) 12/20/2019 0810   GFRAA 84 07/08/2015 1645    BNP    Component Value Date/Time   BNP 620.7 (H) 11/17/2018 1932    ProBNP    Component Value Date/Time   PROBNP 164.9 (H) 03/15/2014 0917    Imaging: DG Chest 2 View  Result Date: 07/24/2020 CLINICAL DATA:  COPD. EXAM: CHEST - 2 VIEW COMPARISON:  PA and lateral chest 02/22/2020 and 04/05/2019. FINDINGS: AICD is unchanged. Coarse pulmonary opacity in the left upper lobe is unchanged. No new airspace disease. No pneumothorax or pleural effusion. No acute or focal bony abnormality. IMPRESSION: No acute disease.  Stable compared to prior exams. Electronically Signed   By: Inge Rise M.D.   On: 07/24/2020 15:54   CUP PACEART REMOTE DEVICE CHECK  Result Date: 07/22/2020 Scheduled remote reviewed. Normal device function.  Next remote 91 days- JBox, RN/CVRS     No flowsheet data found.  No results found for: NITRICOXIDE      Assessment & Plan:   COPD (chronic obstructive pulmonary disease) ([place]) Stable on present regimen  Does not like ANORO  , trial of STiolto   Plan  Patient Instructions  Stop ANORO  Begin Stiolto 2 puffs daily, rinse after use  Mucinex DM .Twice daily  As needed  Cough/congestion  ProAir inhaler or Duoneb every 6hr as needed for wheezing .  Continue on Oxygen 3  l/m .  Continue on  TRILOGY   At bedtime    Low salt diet .   Work on [employer] loss  Do not drive if sleepy.  Follow up with Dr. Hermina Staggers or Evanna Washinton NP  in 3-4 months and As needed   Please contact office for sooner follow up if symptoms do not improve or worsen or seek emergency care        Obesity hypoventilation syndrome ([place]) Excellent control on TRILOGY   Plan  Patient Instructions  Stop [facility]  Begin Stiolto 2 puffs daily, rinse after use  Mucinex DM .Twice daily  As needed  Cough/congestion  ProAir inhaler or Duoneb every 6hr as needed for  wheezing .  Continue on Oxygen 3  l/m .  Continue on  TRILOGY   At bedtime    Low salt diet .   Work on [employer] loss  Do not drive if sleepy.  Follow up with Dr. Hermina Staggers or Daly Whipkey NP  in 3-4 months and As needed   Please contact office for sooner follow up if symptoms do not improve or worsen or seek emergency care        OSA (obstructive sleep apnea) Cont on Trilogy At bedtime    Chronic respiratory failure with hypoxia and hypercapnia (HCC) Cont on O2  Cont on Trilogy At bedtime    Chronic combined systolic and diastolic heart failure ([place]) Appears compensated on present regimen  Plan  Patient Instructions  Stop ANORO  Begin Stiolto 2 puffs daily, rinse after use  Mucinex DM .Twice daily  As needed  Cough/congestion  ProAir inhaler or Duoneb every 6hr as needed for wheezing .  Continue on Oxygen 3  l/m .  Continue on  TRILOGY   At bedtime    Low salt diet .   Work on [employer] loss  Do not drive if sleepy.  Follow up with Dr. Hermina Staggers or  NP  in 3-4 months and As needed   Please contact office for sooner follow up if symptoms do not improve or worsen or seek emergency care           [name], NP 07/24/2020

## 2020-07-24 NOTE — Assessment & Plan Note (Addendum)
Stable on present regimen  Does not like ANORO  , trial of STiolto  Chest xray with chronic scarring .   Plan  Patient Instructions  Stop ANORO  Begin Stiolto 2 puffs daily, rinse after use  Mucinex DM .Twice daily  As needed  Cough/congestion  ProAir inhaler or Duoneb every 6hr as needed for wheezing .  Continue on Oxygen 3  l/m .  Continue on  TRILOGY   At bedtime    Low salt diet .   Work on [employer] loss  Do not drive if sleepy.  Follow up with Dr. Hermina Staggers or Lamiyah Schlotter NP  in 3-4 months and As needed   Please contact office for sooner follow up if symptoms do not improve or worsen or seek emergency care

## 2020-07-24 NOTE — Assessment & Plan Note (Signed)
Cont on O2  Cont on Trilogy At bedtime

## 2020-07-24 NOTE — Assessment & Plan Note (Signed)
Appears compensated on present regimen  Plan  Patient Instructions  Stop ANORO  Begin Stiolto 2 puffs daily, rinse after use  Mucinex DM .Twice daily  As needed  Cough/congestion  ProAir inhaler or Duoneb every 6hr as needed for wheezing .  Continue on Oxygen 3  l/m .  Continue on  TRILOGY   At bedtime    Low salt diet .   Work on [employer] loss  Do not drive if sleepy.  Follow up with Dr. Hermina Staggers or Oaklee Esther NP  in 3-4 months and As needed   Please contact office for sooner follow up if symptoms do not improve or worsen or seek emergency care

## 2020-07-25 NOTE — Telephone Encounter (Signed)
Heather, have you received a DL on this pt? Thanks.

## 2020-07-28 NOTE — Telephone Encounter (Signed)
The last download available in Tamara Silva is from a year ago. Placed a call to [employer] and spoke with [name] regarding a recent download.  [name] patient started on a ventilator (trelegy), advised that this unit cannot be monitored remotely, the unit has to be brought into the store.  Advised the patient will have to be contacted to bring it in to the [place] office.  Patient states she was in the office on Tuesday of last week and that Tammy had the report.  Checked the chart and it does show that she is wearing her Trilogy Vent each night, download shows 100% compliance per [name] NP.  Nothing further needed.

## 2020-07-30 NOTE — Progress Notes (Signed)
Remote ICD transmission.   

## 2020-08-04 ENCOUNTER — Other Ambulatory Visit (HOSPITAL_COMMUNITY): Payer: Self-pay | Admitting: Cardiology

## 2020-08-06 ENCOUNTER — Encounter: Payer: Self-pay | Admitting: Internal Medicine

## 2020-08-06 DIAGNOSIS — E113299 Type 2 diabetes mellitus with mild nonproliferative diabetic retinopathy without macular edema, unspecified eye: Secondary | ICD-10-CM | POA: Insufficient documentation

## 2020-08-18 ENCOUNTER — Other Ambulatory Visit: Payer: Self-pay | Admitting: Endocrinology

## 2020-08-18 DIAGNOSIS — E1142 Type 2 diabetes mellitus with diabetic polyneuropathy: Secondary | ICD-10-CM

## 2020-08-18 DIAGNOSIS — IMO0002 Reserved for concepts with insufficient information to code with codable children: Secondary | ICD-10-CM

## 2020-08-19 ENCOUNTER — Telehealth: Payer: Self-pay | Admitting: Endocrinology

## 2020-08-19 ENCOUNTER — Other Ambulatory Visit: Payer: Self-pay

## 2020-08-19 DIAGNOSIS — E1142 Type 2 diabetes mellitus with diabetic polyneuropathy: Secondary | ICD-10-CM

## 2020-08-19 DIAGNOSIS — IMO0002 Reserved for concepts with insufficient information to code with codable children: Secondary | ICD-10-CM

## 2020-08-19 MED ORDER — NOVOLOG FLEXPEN 100 UNIT/ML ~~LOC~~ SOPN: PEN_INJECTOR | SUBCUTANEOUS | 4 refills | Status: DC

## 2020-08-19 NOTE — Telephone Encounter (Signed)
Patient is having high blood sugars. Patient checked her blood sugars today at 12:55 pm and her blood sugars are 399. Patient requests to be called at ph# [phone].  Patient called again to request that the RX for Novolog that was requested in previous be sent asap. Patient states she has been out of Novolg for 2 days already.

## 2020-08-19 NOTE — Telephone Encounter (Signed)
MEDICATION: Novolog  PHARMACY:   [place], [place] Phone:  [phone]  Fax:  [phone]      HAS THE PATIENT CONTACTED [facility]?  yes  IS THIS A 90 DAY SUPPLY : yes  IS PATIENT OUT OF MEDICATION: yes, out for 2 days  IF NOT; HOW MUCH IS LEFT:   LAST APPOINTMENT DATE: @3 /21/2022  NEXT APPOINTMENT DATE:@Visit  date not found  DO WE HAVE YOUR PERMISSION TO LEAVE A DETAILED MESSAGE?:  OTHER COMMENTS:  Pt states her blood sugars are 300 and she hasn't even eaten yet so she needs it ASAP    **Let patient know to contact pharmacy at the end of the day to make sure medication is ready. **  ** Please notify patient to allow 48-72 hours to process**  **Encourage patient to contact the pharmacy for refills or they can request refills through [facility]**

## 2020-08-24 ENCOUNTER — Encounter: Payer: Self-pay | Admitting: Internal Medicine

## 2020-08-24 NOTE — Progress Notes (Signed)
Seen by nephrologist Dr. Harrie Jeans on 08/13/2020. CKD stage IIIb-secondary to cardiorenal syndrome as well as diabetic nephropathy and microvascular disease from HTN.  Start spironolactone 12.5 mg daily BMP in 1 to 2 weeks Reduce potassium supplement to 20 mEq 3 times a week Plan to add [place] next visit if stable.  Anemia of CKD-continue oral iron Tremor-better off gabapentin Secondary hyperparathyroidism-vitamin D deficiency and CKD.  Start vitamin D 1000 IU daily.

## 2020-08-25 ENCOUNTER — Other Ambulatory Visit: Payer: Self-pay | Admitting: Pulmonary Disease

## 2020-08-25 ENCOUNTER — Other Ambulatory Visit (HOSPITAL_COMMUNITY): Payer: Self-pay | Admitting: Cardiology

## 2020-08-25 ENCOUNTER — Other Ambulatory Visit: Payer: Self-pay | Admitting: Internal Medicine

## 2020-08-25 DIAGNOSIS — J449 Chronic obstructive pulmonary disease, unspecified: Secondary | ICD-10-CM

## 2020-08-25 NOTE — Telephone Encounter (Signed)
Requested medication (s) are due for refill today - yes  Requested medication (s) are on the active medication list -yes  Future visit scheduled -no  Last refill: 05/22/20  Notes to clinic: Notes on last RF - require OV for further RF- no visit- sent for PCP review   Requested Prescriptions  Pending Prescriptions Disp Refills   ACCU-CHEK AVIVA PLUS test strip [Pharmacy Med Name: [place] STRIP] 100 each 0    Sig: USE 1 STRIP TO CHECK BLOOD SUGAR 3 TIMES A DAY .      Endocrinology: Diabetes - Testing Supplies Passed - 08/25/2020 11:30 AM      Passed - Valid encounter within last 12 months    Recent Outpatient Visits           7 months ago Type 2 diabetes mellitus with diabetic polyneuropathy, with long-term current use of insulin ([place])   [place] Karle Plumber B, MD   9 months ago Chronic systolic CHF (congestive heart failure) [facility])   [place] Ladell Pier, MD   1 year ago Chronic systolic CHF (congestive heart failure) [facility])   [place] Ladell Pier, MD   1 year ago Uncontrolled type 2 diabetes mellitus with peripheral neuropathy [facility])   [place] Ladell Pier, MD   1 year ago Encounter for medication counseling   [place], RPH-CPP       Future Appointments             In 1 month Parrett, Fonnie Mu, NP [facility]                 Requested Prescriptions  Pending Prescriptions Disp Refills   ACCU-CHEK AVIVA PLUS test strip [Pharmacy Med Name: [place] STRIP] 100 each 0    Sig: USE 1 STRIP TO CHECK BLOOD SUGAR 3 TIMES A DAY .      Endocrinology: Diabetes - Testing Supplies Passed - 08/25/2020 11:30 AM      Passed - Valid encounter within last 12 months    Recent Outpatient Visits           7 months ago Type  2 diabetes mellitus with diabetic polyneuropathy, with long-term current use of insulin ([place])   [place] Karle Plumber B, MD   9 months ago Chronic systolic CHF (congestive heart failure) [facility])   [place] Ladell Pier, MD   1 year ago Chronic systolic CHF (congestive heart failure) [facility])   [place] Ladell Pier, MD   1 year ago Uncontrolled type 2 diabetes mellitus with peripheral neuropathy [facility])   [place] [facility] Ladell Pier, MD   1 year ago Encounter for medication counseling   [place], RPH-CPP       Future Appointments             In 1 month Parrett, Fonnie Mu, NP [facility]

## 2020-09-10 ENCOUNTER — Other Ambulatory Visit: Payer: Self-pay | Admitting: Internal Medicine

## 2020-09-10 DIAGNOSIS — I25119 Atherosclerotic heart disease of native coronary artery with unspecified angina pectoris: Secondary | ICD-10-CM

## 2020-09-18 ENCOUNTER — Other Ambulatory Visit: Payer: Self-pay | Admitting: Internal Medicine

## 2020-09-18 ENCOUNTER — Telehealth: Payer: Self-pay | Admitting: *Deleted

## 2020-09-18 DIAGNOSIS — E1142 Type 2 diabetes mellitus with diabetic polyneuropathy: Secondary | ICD-10-CM

## 2020-09-18 DIAGNOSIS — E1165 Type 2 diabetes mellitus with hyperglycemia: Secondary | ICD-10-CM

## 2020-09-18 DIAGNOSIS — IMO0002 Reserved for concepts with insufficient information to code with codable children: Secondary | ICD-10-CM

## 2020-09-18 NOTE — Telephone Encounter (Signed)
Call to patient- appointment has been scheduled for medication follow up- RF granted

## 2020-09-18 NOTE — Telephone Encounter (Signed)
Call to patient to schedule medication follow up for Rx refill. Patient has requested a letter with appointment date and time for her records- she does not have access to computer for appointment reminder. Cell phone not accepting calls at this time.

## 2020-09-19 NOTE — Telephone Encounter (Signed)
Called pt no answer on both numbers

## 2020-09-24 ENCOUNTER — Other Ambulatory Visit: Payer: Self-pay | Admitting: Endocrinology

## 2020-10-13 ENCOUNTER — Other Ambulatory Visit (HOSPITAL_COMMUNITY): Payer: Self-pay | Admitting: Cardiology

## 2020-10-13 ENCOUNTER — Other Ambulatory Visit: Payer: Self-pay | Admitting: Internal Medicine

## 2020-10-13 DIAGNOSIS — I25119 Atherosclerotic heart disease of native coronary artery with unspecified angina pectoris: Secondary | ICD-10-CM

## 2020-10-13 NOTE — Telephone Encounter (Signed)
Requested Prescriptions  Pending Prescriptions Disp Refills  . rosuvastatin (CRESTOR) 40 MG tablet [Pharmacy Med Name: ROSUVASTATIN CALCIUM 40 MG ORAL TABLET] 90 tablet 0    Sig: TAKE 1 TABLET (40 MG TOTAL) BY MOUTH DAILY AT 6 PM.     Cardiovascular:  Antilipid - Statins Passed - 10/13/2020  5:46 PM      Passed - Total Cholesterol in normal range and within 360 days    Cholesterol  Date Value Ref Range Status  10/22/2019 150 0 - 200 mg/dL Final    Comment:    ATP III Classification       Desirable:  < 200 mg/dL               Borderline High:  200 - 239 mg/dL          High:  > = 240 mg/dL         Passed - LDL in normal range and within 360 days    LDL Cholesterol  Date Value Ref Range Status  10/22/2019 68 0 - 99 mg/dL Final         Passed - HDL in normal range and within 360 days    HDL  Date Value Ref Range Status  10/22/2019 56.50 >39.00 mg/dL Final         Passed - Triglycerides in normal range and within 360 days    Triglycerides  Date Value Ref Range Status  10/22/2019 129.0 0.0 - 149.0 mg/dL Final    Comment:    Normal:  <150 mg/dLBorderline High:  150 - 199 mg/dL         Passed - Patient is not pregnant      Passed - Valid encounter within last 12 months    Recent Outpatient Visits          8 months ago Type 2 diabetes mellitus with diabetic polyneuropathy, with long-term current use of insulin ([place])   [place], Neoma Laming B, MD   10 months ago Chronic systolic CHF (congestive heart failure) [facility])   [place], Deborah B, MD   1 year ago Chronic systolic CHF (congestive heart failure) [facility])   [place] Ladell Pier, MD   1 year ago Uncontrolled type 2 diabetes mellitus with peripheral neuropathy [facility])   [place] Ladell Pier, MD   1 year ago Encounter for medication counseling   [place], RPH-CPP      Future Appointments            In 1 week Parrett, Fonnie Mu, NP [facility]   In 1 month Ladell Pier, MD [place]

## 2020-10-21 ENCOUNTER — Ambulatory Visit: Payer: Medicaid Other | Admitting: Endocrinology

## 2020-10-21 ENCOUNTER — Ambulatory Visit (INDEPENDENT_AMBULATORY_CARE_PROVIDER_SITE_OTHER): Payer: Medicaid Other

## 2020-10-21 ENCOUNTER — Telehealth: Payer: Self-pay

## 2020-10-21 DIAGNOSIS — I255 Ischemic cardiomyopathy: Secondary | ICD-10-CM | POA: Diagnosis not present

## 2020-10-21 LAB — CUP PACEART REMOTE DEVICE CHECK
Battery Remaining Longevity: 128 mo
Battery Voltage: 3.04 V
Brady Statistic RV Percent Paced: 0.02 %
Date Time Interrogation Session: 20220524012503
HighPow Impedance: 67 Ohm
Implantable Lead Implant Date: 20201104
Implantable Lead Location: 753860
Implantable Pulse Generator Implant Date: 20201104
Lead Channel Impedance Value: 304 Ohm
Lead Channel Impedance Value: 342 Ohm
Lead Channel Pacing Threshold Amplitude: 0.5 V
Lead Channel Pacing Threshold Pulse Width: 0.4 ms
Lead Channel Sensing Intrinsic Amplitude: 15.375 mV
Lead Channel Sensing Intrinsic Amplitude: 15.375 mV
Lead Channel Setting Pacing Amplitude: 2.5 V
Lead Channel Setting Pacing Pulse Width: 0.4 ms
Lead Channel Setting Sensing Sensitivity: 0.3 mV

## 2020-10-21 NOTE — Telephone Encounter (Signed)
[place] APPROVED UNTIL 10/21/21

## 2020-10-23 ENCOUNTER — Other Ambulatory Visit: Payer: Self-pay

## 2020-10-23 ENCOUNTER — Ambulatory Visit (INDEPENDENT_AMBULATORY_CARE_PROVIDER_SITE_OTHER): Payer: Medicaid Other | Admitting: Adult Health

## 2020-10-23 ENCOUNTER — Encounter: Payer: Self-pay | Admitting: Adult Health

## 2020-10-23 VITALS — BP 124/78 | HR 63 | Temp 97.7°F | Ht 67.5 in | Wt 347.4 lb

## 2020-10-23 DIAGNOSIS — N1832 Chronic kidney disease, stage 3b: Secondary | ICD-10-CM

## 2020-10-23 DIAGNOSIS — I5042 Chronic combined systolic (congestive) and diastolic (congestive) heart failure: Secondary | ICD-10-CM

## 2020-10-23 DIAGNOSIS — E662 Morbid (severe) obesity with alveolar hypoventilation: Secondary | ICD-10-CM | POA: Diagnosis not present

## 2020-10-23 DIAGNOSIS — J9612 Chronic respiratory failure with hypercapnia: Secondary | ICD-10-CM

## 2020-10-23 DIAGNOSIS — J9611 Chronic respiratory failure with hypoxia: Secondary | ICD-10-CM | POA: Diagnosis not present

## 2020-10-23 DIAGNOSIS — J449 Chronic obstructive pulmonary disease, unspecified: Secondary | ICD-10-CM

## 2020-10-23 NOTE — Assessment & Plan Note (Signed)
Continue follow-up with nephrology. 

## 2020-10-23 NOTE — Assessment & Plan Note (Signed)
Continue on oxygen and trilogy vent at nighttime and with naps

## 2020-10-23 NOTE — Progress Notes (Addendum)
@Patient  ID: Tamara Silva, female    DOB: February 20, 1961, 60 y.o.   MRN: [id number]  Chief Complaint  Patient presents with  . Follow-up    Referring provider: Ladell Pier, MD  HPI: 60 year old female former smoker followed for COPD,   obstructive sleep apnea/obesity hypoventilation syndrome on nocturnal Trilogy vent , chronic respiratory failure on oxygen.  Medical history significant for coronary artery disease, congestive heart failure, PEA arrest in 2020 status post ICD, Implant  TEST/EVENTS :  Sleep study done 11/2014 showed Mild OSA with AHI at 5.7, mild desats ~82%.  Spirometry 12/2014 showed FEV1 at 34%, ratio 66.  2D echo October 2020 EF 30 to 35%  CT chest 06/2015 Lungs/Pleura:  Calcified pleural plaques within the left hemi thorax with associated scarring in the left upper and left lower lobes is grossly stable. No pleural effusion or pneumothorax.  10/23/2020 Follow up : COPD , OSA/OHS , O2 RF  Patient returns for a 46-monthfollow-up.  Patient has underlying severe COPD that is oxygen dependent.  Last visit patient was changed from Anoro to [employer]aur[employer]  Patient wanted to be off of a dry powder inhaler.  She says overall breathing is doing okay . Gets winded with activities. Dyspnea is at baseline. No increased cough/congestion .  [place] not [place] .  COVID-vaccine x3 is up-to-date. Going to get booster shot soon .  She is oxygen dependent at 3 L.  Denies any increased oxygen demands.    She  has underlying obstructive sleep apnea and obesity hypoventilation syndrome.  She is on trilogy vent at bedtime and with naps.  She uses her trilogy every single night.  Says she does not sleep without it.  Download last visit showed 100% compliance with daily average usage around 5 hours.      Allergies  Allergen Reactions  . Metolazone Other (See Comments)    Dizziness and falling Per patient in 2021, states she does not think she fell because of  this medication.  . Plavix [Clopidogrel Bisulfate] Other (See Comments)    High PRU's - non-responder  . Ibuprofen Other (See Comments)    Pt states she is not supposed to take ibuprofen because of other meds she is taking  . Nsaids Other (See Comments)    "I do not take NSAIDs d/t interfering with other meds"    Immunization History  Administered Date(s) Administered  . Influenza,inj,Quad PF,6+ Mos 02/08/2014, 05/22/2015, 06/13/2018, 01/19/2019  . PFIZER(Purple Top)SARS-COV-2 Vaccination 11/14/2019, 12/07/2019, 05/14/2020  . Pneumococcal Polysaccharide-23 01/19/2019    Past Medical History:  Diagnosis Date  . AICD (automatic cardioverter/defibrillator) present 04/04/2019  . Asthma   . CAD (coronary artery disease)   . Chronic combined systolic (congestive) and diastolic (congestive) heart failure ([place]   . Chronic iron deficiency anemia   . CVA (cerebral vascular accident) ([place] 05/2018  . Diabetes mellitus without complication ([place]   . DOE (dyspnea on exertion)   . Edema of both legs   . Hepatic steatosis   . Hypertension   . Pulmonary emboli ([place] 2014  . Respiratory failure ([place] 09/2018  . Seizure ([place] 05/2018    Tobacco History: Social History   Tobacco Use  Smoking Status Former Smoker  . Packs/day: 0.50  . Years: 20.00  . Pack years: 10.00  . Types: Cigarettes  . Quit date: 05/31/2018  . Years since quitting: 2.4  Smokeless Tobacco Never Used  Tobacco Comment   smoked off and on  x 20 years   Counseling given: Not Answered Comment: smoked off and on x 20 years   Outpatient Medications Prior to Visit  Medication Sig Dispense Refill  . acetaminophen (TYLENOL) 500 MG tablet Take 500 mg by mouth every 6 (six) hours as needed for moderate pain or headache.    Jearl Klinefelter ELLIPTA 62.5-25 MCG/INH AEPB Inhale 1 puff into the lungs daily.    [name] Kitchen ascorbic acid (VITAMIN C) 500 MG tablet TAKE ONE TABLET BY MOUTH ONCE DAILY 90 tablet 3  . ASPIRIN LOW DOSE 81 MG EC tablet  CHEW 1 TABLET (81 MG TOTAL) BY MOUTH DAILY. 90 tablet 3  . BIDIL 20-37.5 MG tablet TAKE 1 TABLET BY MOUTH 3 (THREE) TIMES DAILY. 270 tablet 3  . bisoprolol (ZEBETA) 5 MG tablet TAKE 1 TABLET (5 MG TOTAL) BY MOUTH DAILY. 30 tablet 6  . Blood Glucose Monitoring Suppl (ACCU-CHEK GUIDE) w/Device KIT Use accu chek guide to check blood sugar three times daily. 1 kit 0  . ENTRESTO 24-26 MG TAKE 1 TABLET BY MOUTH 2 (TWO) TIMES DAILY. 60 tablet 5  . FEROSUL 325 (65 Fe) MG tablet TAKE 1 TABLET (325 MG TOTAL) BY MOUTH DAILY AFTER SUPPER. PLEASE TAKE WITH A SOURCE OF VITAMIN C 90 tablet 3  . fluticasone (FLONASE) 50 MCG/ACT nasal spray PLACE 1 SPRAY INTO BOTH NOSTRILS DAILY. 16 g 1  . glucose blood (ACCU-CHEK AVIVA PLUS) test strip USE 1 STRIP TO CHECK BLOOD GLUCOSE 3 TIMES A DAY 100 each 0  . hydrocerin (EUCERIN) CREA Apply 1 application topically 2 (two) times daily.  0  . insulin degludec (TRESIBA FLEXTOUCH) 200 UNIT/ML FlexTouch Pen INJECT 80 UNITS UNDER THE SKIN ONCE DAILY  LAST FOR 45 DAYS 18 mL 3  . Insulin Pen Needle (EASY COMFORT PEN NEEDLES) 31G X 5 MM MISC USE 3 TIMES A DAY FOR INSULIN ADMINISTRATION 100 each 12  . ipratropium-albuterol (DUONEB) 0.5-2.5 (3) MG/3ML SOLN INHALE CONTENTS OF ONE VIAL USING NEBULIZER EVERY 6 HOURS AS NEEDED FOR SHORTNESS OF BREATH, WHEEZING 360 mL 11  . metolazone (ZAROXOLYN) 2.5 MG tablet TAKE 1 TABLET (2.5 MG TOTAL) BY MOUTH ONCE A WEEK. WED 4 tablet 3  . NEEDLE, DISP, 26 G 26G X 1/2" MISC 1 application by Does not apply route daily. 100 each 0  . NOVOLOG FLEXPEN 100 UNIT/ML FlexPen Inject 20 units under the skin at breakfast and lunch, and 24 units at dinner. 30 mL 4  . omeprazole (PRILOSEC) 40 MG capsule TAKE 1 CAPSULE (40 MG TOTAL) BY MOUTH DAILY. 90 capsule 1  . potassium chloride SA (KLOR-CON) 20 MEQ tablet TAKE 2 TABLETS (40 MEQ TOTAL) BY MOUTH DAILY. 90 tablet 1  . PROAIR HFA 108 (90 Base) MCG/ACT inhaler INHALE 2 PUFFS BY MOUTH 4 TIMES DAILY AS NEEDED 8.5 g 3   . RESTASIS 0.05 % ophthalmic emulsion Place 1 drop into both eyes daily as needed.     . rosuvastatin (CRESTOR) 40 MG tablet TAKE 1 TABLET (40 MG TOTAL) BY MOUTH DAILY AT 6 PM. 90 tablet 0  . spironolactone (ALDACTONE) 25 MG tablet Take 1 tablet by mouth daily.    [name] Kitchen torsemide (DEMADEX) 100 MG tablet Take 1 tablet (100 mg total) by mouth 2 (two) times daily. Need to schedule an appointment for future refills 60 tablet 0  . VICTOZA 18 MG/3ML SOPN INJECT 1.8 MG INTO THE SKIN DAILY. 9 mL 0  . vitamin B-12 (CYANOCOBALAMIN) 1000 MCG tablet Take 1,000 mcg by mouth  daily.    . Vitamin D, Ergocalciferol, (DRISDOL) 1.25 MG (50000 UNIT) CAPS capsule Take 50,000 Units by mouth once a week.    . Tiotropium Bromide-Olodaterol (STIOLTO RESPIMAT) 2.5-2.5 MCG/ACT AERS Inhale 2 puffs into the lungs daily. (Patient not taking: Reported on 10/23/2020) 1 each 5  . amoxicillin-clavulanate (AUGMENTIN) 875-125 MG tablet Take 1 tablet by mouth 2 (two) times daily. (Patient not taking: Reported on 07/24/2020) 14 tablet 0   No facility-administered medications prior to visit.     Review of Systems:   Constitutional:   No  weight loss, night sweats,  Fevers, chills, +fatigue, or  lassitude.  HEENT:   No headaches,  Difficulty swallowing,  Tooth/dental problems, or  Sore throat,                No sneezing, itching, ear ache, nasal congestion, post nasal drip,   CV:  No chest pain,  Orthopnea, PND, +swelling in lower extremities, no anasarca, dizziness, palpitations, syncope.   GI  No heartburn, indigestion, abdominal pain, nausea, vomiting, diarrhea, change in bowel habits, loss of appetite, bloody stools.   Resp:    No chest wall deformity  Skin: no rash or lesions.  GU: no dysuria, change in color of urine, no urgency or frequency.  No flank pain, no hematuria   MS:  No joint pain or swelling.  No decreased range of motion.  No back pain.    Physical Exam  BP 124/78 (BP Location: Right Wrist, Cuff Size:  Normal)   Pulse 63   Temp 97.7 F (36.5 C) (Temporal)   Ht 5' 7.5" (1.715 m)   Wt (!) 347 lb 6.4 oz (157.6 kg)   LMP 07/28/2013 Comment: perimenopausal  SpO2 100% Comment: RA  BMI 53.61 kg/m   GEN: A/Ox3; pleasant , NAD, in wc on O2    HEENT:  [place]/AT,   NOSE-clear, THROAT-clear, no lesions, no postnasal drip or exudate noted.   NECK:  Supple w/ fair ROM; no JVD; normal carotid impulses w/o bruits; no thyromegaly or nodules palpated; no lymphadenopathy.    RESP  Clear  P & A; w/o, wheezes/ rales/ or rhonchi. no accessory muscle use, no dullness to percussion  CARD:  RRR, no m/r/g, tr peripheral edema, pulses intact, no cyanosis or clubbing.  GI:   Soft & nt; nml bowel sounds; no organomegaly or masses detected.   Musco: Warm bil, no deformities or joint swelling noted.   Neuro: alert, no focal deficits noted.    Skin: Warm, no lesions or rashes    Lab Results:      No flowsheet data found.  No results found for: NITRICOXIDE      Assessment & Plan:   COPD GOLD III  Severe COPD.  Continue on Anoro.  PFTs on return visit.  Plan  Patient Instructions  Continue on ANORO 1 puff daily, rinse after use  Mucinex DM .Twice daily  As needed  Cough/congestion  ProAir inhaler or Duoneb every 6hr as needed for wheezing .  Continue on Oxygen 3  l/m .  Continue on  TRILOGY   At bedtime   And with naps.  Low salt diet .   Work on [employer] loss  Do not drive if sleepy.  Follow up with Dr. Hermina Staggers  in 3-4 months with PFTs  and As needed   Please contact office for sooner follow up if symptoms do not improve or worsen or seek emergency care  Chronic combined systolic and diastolic heart failure (HCC) Appears euvolemic continue with current regimen and follow-up with cardiology  Chronic respiratory failure with hypoxia and hypercapnia (HCC) Continue on oxygen 3 L to maintain O2 saturation greater than 88 to 90%.  Continue on trilogy vent at bedtime and with  naps.  Obesity hypoventilation syndrome (HCC) Continue on oxygen and trilogy vent at nighttime and with naps  CKD (chronic kidney disease) stage 3, GFR 30-59 ml/min (HCC) Continue follow-up with nephrology     [name], NP 10/23/2020

## 2020-10-23 NOTE — Assessment & Plan Note (Signed)
Continue on oxygen 3 L to maintain O2 saturation greater than 88 to 90%.  Continue on trilogy vent at bedtime and with naps.

## 2020-10-23 NOTE — Patient Instructions (Addendum)
Continue on ANORO 1 puff daily, rinse after use  Mucinex DM .Twice daily  As needed  Cough/congestion  ProAir inhaler or Duoneb every 6hr as needed for wheezing .  Continue on Oxygen 3  l/m .  Continue on  TRILOGY   At bedtime   And with naps.  Low salt diet .   Work on [employer] loss  Do not drive if sleepy.  Follow up with Dr. Hermina Staggers  in 3-4 months with PFTs  and As needed   Please contact office for sooner follow up if symptoms do not improve or worsen or seek emergency care

## 2020-10-23 NOTE — Assessment & Plan Note (Signed)
Severe COPD.  Continue on Anoro.  PFTs on return visit.  Plan  Patient Instructions  Continue on ANORO 1 puff daily, rinse after use  Mucinex DM .Twice daily  As needed  Cough/congestion  ProAir inhaler or Duoneb every 6hr as needed for wheezing .  Continue on Oxygen 3  l/m .  Continue on  TRILOGY   At bedtime   And with naps.  Low salt diet .   Work on [employer] loss  Do not drive if sleepy.  Follow up with Dr. Hermina Staggers  in 3-4 months with PFTs  and As needed   Please contact office for sooner follow up if symptoms do not improve or worsen or seek emergency care

## 2020-10-23 NOTE — Assessment & Plan Note (Signed)
Appears euvolemic continue with current regimen and follow-up with cardiology

## 2020-11-03 ENCOUNTER — Other Ambulatory Visit (HOSPITAL_COMMUNITY): Payer: Self-pay | Admitting: Cardiology

## 2020-11-12 NOTE — Progress Notes (Signed)
Remote ICD transmission.   

## 2020-11-17 ENCOUNTER — Other Ambulatory Visit: Payer: Self-pay | Admitting: Internal Medicine

## 2020-11-17 DIAGNOSIS — IMO0002 Reserved for concepts with insufficient information to code with codable children: Secondary | ICD-10-CM

## 2020-11-17 DIAGNOSIS — E1142 Type 2 diabetes mellitus with diabetic polyneuropathy: Secondary | ICD-10-CM

## 2020-11-17 NOTE — Telephone Encounter (Signed)
   Notes to clinic: Patient has appt tomorrow   Requested Prescriptions  Pending Prescriptions Disp Refills   [place] 18 MG/3ML SOPN [Pharmacy Med Name: VICTOZA 56 MG/3ML SUBCUTANEOUS SOLUTION PEN-INJECTOR] 9 mL 0    Sig: INJECT 1.8 MG INTO THE SKIN DAILY.      Endocrinology:  Diabetes - GLP-1 Receptor Agonists Failed - 11/17/2020  9:41 AM      Failed - HBA1C is between 0 and 7.9 and within 180 days    Hemoglobin A1C  Date Value Ref Range Status  10/22/2019 7.4 (A) 4.0 - 5.6 % Final   HbA1c, POC (prediabetic range)  Date Value Ref Range Status  01/23/2018 8 (A) 5.7 - 6.4 % Final   HbA1c, POC (controlled diabetic range)  Date Value Ref Range Status  01/19/2019 8.8 (A) 0.0 - 7.0 % Final          Failed - Valid encounter within last 6 months    Recent Outpatient Visits           9 months ago Type 2 diabetes mellitus with diabetic polyneuropathy, with long-term current use of insulin ([place])   [place] Karle Plumber B, MD   12 months ago Chronic systolic CHF (congestive heart failure) [facility])   [place], Deborah B, MD   1 year ago Chronic systolic CHF (congestive heart failure) [name])   [place], Deborah B, MD   1 year ago Uncontrolled type 2 diabetes mellitus with peripheral neuropathy [facility])   [place] Ladell Pier, MD   1 year ago Encounter for medication counseling   [place], Jarome Matin, RPH-CPP       Future Appointments             Tomorrow Ladell Pier, MD [place]

## 2020-11-18 ENCOUNTER — Ambulatory Visit: Payer: Medicaid Other | Admitting: Internal Medicine

## 2020-11-25 ENCOUNTER — Other Ambulatory Visit: Payer: Self-pay | Admitting: Internal Medicine

## 2020-11-25 DIAGNOSIS — E1142 Type 2 diabetes mellitus with diabetic polyneuropathy: Secondary | ICD-10-CM

## 2020-11-25 DIAGNOSIS — IMO0002 Reserved for concepts with insufficient information to code with codable children: Secondary | ICD-10-CM

## 2020-11-25 NOTE — Telephone Encounter (Signed)
   Notes to clinic:  Patient states that she has to get her portable oxygen situated before she can scheduled appointment. Patient would like to have refill sent until she can get in   Requested Prescriptions  Pending Prescriptions Disp Refills   [place] 18 MG/3ML SOPN [Pharmacy Med Name: VICTOZA 3 MG/3ML SUBCUTANEOUS SOLUTION PEN-INJECTOR] 9 mL 0    Sig: INJECT 1.8 MG INTO THE SKIN DAILY.      Endocrinology:  Diabetes - GLP-1 Receptor Agonists Failed - 11/25/2020 10:10 AM      Failed - HBA1C is between 0 and 7.9 and within 180 days    Hemoglobin A1C  Date Value Ref Range Status  10/22/2019 7.4 (A) 4.0 - 5.6 % Final   HbA1c, POC (prediabetic range)  Date Value Ref Range Status  01/23/2018 8 (A) 5.7 - 6.4 % Final   HbA1c, POC (controlled diabetic range)  Date Value Ref Range Status  01/19/2019 8.8 (A) 0.0 - 7.0 % Final          Failed - Valid encounter within last 6 months    Recent Outpatient Visits           10 months ago Type 2 diabetes mellitus with diabetic polyneuropathy, with long-term current use of insulin ([place])   [place], Deborah B, MD   1 year ago Chronic systolic CHF (congestive heart failure) [facility])   [place], Deborah B, MD   1 year ago Chronic systolic CHF (congestive heart failure) [name])   [place] Ladell Pier, MD   1 year ago Uncontrolled type 2 diabetes mellitus with peripheral neuropathy [facility])   [place] Ladell Pier, MD   1 year ago Encounter for medication counseling   [place], RPH-CPP

## 2020-11-28 ENCOUNTER — Other Ambulatory Visit: Payer: Self-pay | Admitting: Internal Medicine

## 2020-11-28 ENCOUNTER — Other Ambulatory Visit: Payer: Self-pay | Admitting: Endocrinology

## 2020-11-28 DIAGNOSIS — E1142 Type 2 diabetes mellitus with diabetic polyneuropathy: Secondary | ICD-10-CM

## 2020-11-28 DIAGNOSIS — IMO0002 Reserved for concepts with insufficient information to code with codable children: Secondary | ICD-10-CM

## 2020-11-28 NOTE — Telephone Encounter (Signed)
  Notes to clinic:  Patient not showed appt on 11/18/2020 Review for continued use and refill    Requested Prescriptions  Pending Prescriptions Disp Refills   VICTOZA 18 MG/3ML SOPN [Pharmacy Med Name: VICTOZA 18 MG/3ML SUBCUTANEOUS SOLUTION PEN-INJECTOR] 9 mL 0    Sig: INJECT 1.8 MG INTO THE SKIN DAILY.      Endocrinology:  Diabetes - GLP-1 Receptor Agonists Failed - 11/28/2020  1:27 PM      Failed - HBA1C is between 0 and 7.9 and within 180 days    Hemoglobin A1C  Date Value Ref Range Status  10/22/2019 7.4 (A) 4.0 - 5.6 % Final   HbA1c, POC (prediabetic range)  Date Value Ref Range Status  01/23/2018 8 (A) 5.7 - 6.4 % Final   HbA1c, POC (controlled diabetic range)  Date Value Ref Range Status  01/19/2019 8.8 (A) 0.0 - 7.0 % Final          Failed - Valid encounter within last 6 months    Recent Outpatient Visits           10 months ago Type 2 diabetes mellitus with diabetic polyneuropathy, with long-term current use of insulin ([place])   [place] Karle Plumber B, MD   1 year ago Chronic systolic CHF (congestive heart failure) [facility])   [place] Ladell Pier, MD   1 year ago Chronic systolic CHF (congestive heart failure) [facility])   [place] Karle Plumber B, MD   1 year ago Uncontrolled type 2 diabetes mellitus with peripheral neuropathy [facility])   [place] Ladell Pier, MD   1 year ago Encounter for medication counseling   [place], Jarome Matin, RPH-CPP                  ACCU-CHEK AVIVA PLUS test strip [place] Med Name: [place] STRIP] 100 each 0    Sig: USE 1 STRIP TO CHECK BLOOD SUGAR 3 TIMES A DAY .      Endocrinology: Diabetes - Testing Supplies Passed - 11/28/2020  1:27 PM      Passed - Valid encounter within last 12 months    Recent Outpatient Visits            10 months ago Type 2 diabetes mellitus with diabetic polyneuropathy, with long-term current use of insulin ([place])   [place] Karle Plumber B, MD   1 year ago Chronic systolic CHF (congestive heart failure) [facility])   [place], Deborah B, MD   1 year ago Chronic systolic CHF (congestive heart failure) [facility])   [place] Ladell Pier, MD   1 year ago Uncontrolled type 2 diabetes mellitus with peripheral neuropathy [facility])   [place] Ladell Pier, MD   1 year ago Encounter for medication counseling   [place], RPH-CPP

## 2020-12-02 ENCOUNTER — Telehealth: Payer: Self-pay | Admitting: Pulmonary Disease

## 2020-12-02 ENCOUNTER — Other Ambulatory Visit (HOSPITAL_COMMUNITY): Payer: Self-pay | Admitting: Cardiology

## 2020-12-02 DIAGNOSIS — J449 Chronic obstructive pulmonary disease, unspecified: Secondary | ICD-10-CM

## 2020-12-02 NOTE — Telephone Encounter (Signed)
Spoke to patient, who is requesting order for nebulizer machine.  Order has been placed to [facility] as requested.  Nothing further needed at this time.

## 2020-12-15 ENCOUNTER — Other Ambulatory Visit: Payer: Self-pay | Admitting: Internal Medicine

## 2020-12-15 DIAGNOSIS — I25119 Atherosclerotic heart disease of native coronary artery with unspecified angina pectoris: Secondary | ICD-10-CM

## 2020-12-19 ENCOUNTER — Other Ambulatory Visit: Payer: Self-pay | Admitting: Internal Medicine

## 2020-12-19 DIAGNOSIS — IMO0002 Reserved for concepts with insufficient information to code with codable children: Secondary | ICD-10-CM

## 2020-12-19 DIAGNOSIS — E1142 Type 2 diabetes mellitus with diabetic polyneuropathy: Secondary | ICD-10-CM

## 2020-12-19 DIAGNOSIS — E1165 Type 2 diabetes mellitus with hyperglycemia: Secondary | ICD-10-CM

## 2020-12-19 NOTE — Telephone Encounter (Signed)
Requested medication (s) are due for refill today: no  Requested medication (s) are on the active medication list: yes   Last refill:  10/25/2020  Future visit scheduled: yes   Notes to clinic: Patient no showed last appt  Overdue for follow up    Requested Prescriptions  Pending Prescriptions Disp Refills   [place] 18 MG/3ML SOPN [Pharmacy Med Name: VICTOZA 18 MG/3ML SUBCUTANEOUS SOLUTION PEN-INJECTOR] 9 mL 0    Sig: INJECT 1.8 MG INTO THE SKIN DAILY.      Endocrinology:  Diabetes - GLP-1 Receptor Agonists Failed - 12/19/2020  9:16 AM      Failed - HBA1C is between 0 and 7.9 and within 180 days    Hemoglobin A1C  Date Value Ref Range Status  10/22/2019 7.4 (A) 4.0 - 5.6 % Final   HbA1c, POC (prediabetic range)  Date Value Ref Range Status  01/23/2018 8 (A) 5.7 - 6.4 % Final   HbA1c, POC (controlled diabetic range)  Date Value Ref Range Status  01/19/2019 8.8 (A) 0.0 - 7.0 % Final          Failed - Valid encounter within last 6 months    Recent Outpatient Visits           10 months ago Type 2 diabetes mellitus with diabetic polyneuropathy, with long-term current use of insulin ([place])   [place], Deborah B, MD   1 year ago Chronic systolic CHF (congestive heart failure) [facility])   [place], Deborah B, MD   1 year ago Chronic systolic CHF (congestive heart failure) [facility])   [place] Ladell Pier, MD   1 year ago Uncontrolled type 2 diabetes mellitus with peripheral neuropathy [facility])   [place] Ladell Pier, MD   1 year ago Encounter for medication counseling   [place], Jarome Matin, RPH-CPP       Future Appointments             In 1 month Laurin Coder, MD [facility]

## 2020-12-25 ENCOUNTER — Other Ambulatory Visit: Payer: Self-pay | Admitting: Internal Medicine

## 2020-12-25 ENCOUNTER — Other Ambulatory Visit: Payer: Self-pay | Admitting: Adult Health

## 2020-12-25 DIAGNOSIS — K219 Gastro-esophageal reflux disease without esophagitis: Secondary | ICD-10-CM

## 2020-12-25 DIAGNOSIS — J449 Chronic obstructive pulmonary disease, unspecified: Secondary | ICD-10-CM

## 2020-12-25 NOTE — Telephone Encounter (Signed)
   Notes to clinic:  Patient was a no show to last appt Due for follow up    Requested Prescriptions  Pending Prescriptions Disp Refills   omeprazole (PRILOSEC) 40 MG capsule [Pharmacy Med Name: OMEPRAZOLE 40 MG ORAL CAPSULE DELAYED RELEASE] 90 capsule 1    Sig: TAKE 1 CAPSULE (40 MG TOTAL) BY MOUTH DAILY.      Gastroenterology: Proton Pump Inhibitors Passed - 12/25/2020 10:15 AM      Passed - Valid encounter within last 12 months    Recent Outpatient Visits           11 months ago Type 2 diabetes mellitus with diabetic polyneuropathy, with long-term current use of insulin ([place])   [place] Karle Plumber B, MD   1 year ago Chronic systolic CHF (congestive heart failure) [facility])   [place] Ladell Pier, MD   1 year ago Chronic systolic CHF (congestive heart failure) [facility])   [place] Ladell Pier, MD   1 year ago Uncontrolled type 2 diabetes mellitus with peripheral neuropathy [facility])   [place] Ladell Pier, MD   1 year ago Encounter for medication counseling   [place], RPH-CPP       Future Appointments             In 1 month Laurin Coder, MD [facility]

## 2021-01-12 ENCOUNTER — Other Ambulatory Visit: Payer: Self-pay | Admitting: Internal Medicine

## 2021-01-12 DIAGNOSIS — I25119 Atherosclerotic heart disease of native coronary artery with unspecified angina pectoris: Secondary | ICD-10-CM

## 2021-01-12 NOTE — Telephone Encounter (Signed)
Requested medication (s) are due for refill today: no  Requested medication (s) are on the active medication list: yes   Last refill:  01/12/2021  Future visit scheduled: no  Notes to clinic:  Failed protocol:  Total Cholesterol in normal range and within 360 days   LDL in normal range and within 360 days   HDL in normal range and within 360 days   Triglycerides in normal range and within 360 days     Requested Prescriptions  Pending Prescriptions Disp Refills   rosuvastatin (CRESTOR) 40 MG tablet [Pharmacy Med Name: ROSUVASTATIN CALCIUM 40 MG ORAL TABLET] 90 tablet 0    Sig: TAKE 1 TABLET (40 MG TOTAL) BY MOUTH DAILY AT 6 PM.     Cardiovascular:  Antilipid - Statins Failed - 01/12/2021 10:56 AM      Failed - Total Cholesterol in normal range and within 360 days    Cholesterol  Date Value Ref Range Status  10/22/2019 150 0 - 200 mg/dL Final    Comment:    ATP III Classification       Desirable:  < 200 mg/dL               Borderline High:  200 - 239 mg/dL          High:  > = 240 mg/dL          Failed - LDL in normal range and within 360 days    LDL Cholesterol  Date Value Ref Range Status  10/22/2019 68 0 - 99 mg/dL Final          Failed - HDL in normal range and within 360 days    HDL  Date Value Ref Range Status  10/22/2019 56.50 >39.00 mg/dL Final          Failed - Triglycerides in normal range and within 360 days    Triglycerides  Date Value Ref Range Status  10/22/2019 129.0 0.0 - 149.0 mg/dL Final    Comment:    Normal:  <150 mg/dLBorderline High:  150 - 199 mg/dL          Passed - Patient is not pregnant      Passed - Valid encounter within last 12 months    Recent Outpatient Visits           11 months ago Type 2 diabetes mellitus with diabetic polyneuropathy, with long-term current use of insulin ([place])   Bell, Deborah B, MD   1 year ago Chronic systolic CHF (congestive heart failure) [facility])   [place] Ladell Pier, MD   1 year ago Chronic systolic CHF (congestive heart failure) [facility])   [place] Ladell Pier, MD   1 year ago Uncontrolled type 2 diabetes mellitus with peripheral neuropathy [facility])   [place] Ladell Pier, MD   1 year ago Encounter for medication counseling   [place], Jarome Matin, RPH-CPP       Future Appointments             In 2 weeks Laurin Coder, MD [facility]

## 2021-01-20 ENCOUNTER — Other Ambulatory Visit: Payer: Self-pay | Admitting: Endocrinology

## 2021-01-20 ENCOUNTER — Ambulatory Visit (INDEPENDENT_AMBULATORY_CARE_PROVIDER_SITE_OTHER): Payer: Medicaid Other

## 2021-01-20 DIAGNOSIS — E1165 Type 2 diabetes mellitus with hyperglycemia: Secondary | ICD-10-CM

## 2021-01-20 DIAGNOSIS — E1142 Type 2 diabetes mellitus with diabetic polyneuropathy: Secondary | ICD-10-CM

## 2021-01-20 DIAGNOSIS — I255 Ischemic cardiomyopathy: Secondary | ICD-10-CM | POA: Diagnosis not present

## 2021-01-20 DIAGNOSIS — IMO0002 Reserved for concepts with insufficient information to code with codable children: Secondary | ICD-10-CM

## 2021-01-20 LAB — CUP PACEART REMOTE DEVICE CHECK
Battery Remaining Longevity: 127 mo
Battery Voltage: 3.03 V
Brady Statistic RV Percent Paced: 0.02 %
Date Time Interrogation Session: 20220823012403
HighPow Impedance: 67 Ohm
Implantable Lead Implant Date: 20201104
Implantable Lead Location: 753860
Implantable Pulse Generator Implant Date: 20201104
Lead Channel Impedance Value: 266 Ohm
Lead Channel Impedance Value: 380 Ohm
Lead Channel Pacing Threshold Amplitude: 0.5 V
Lead Channel Pacing Threshold Pulse Width: 0.4 ms
Lead Channel Sensing Intrinsic Amplitude: 16.75 mV
Lead Channel Sensing Intrinsic Amplitude: 16.75 mV
Lead Channel Setting Pacing Amplitude: 2.5 V
Lead Channel Setting Pacing Pulse Width: 0.4 ms
Lead Channel Setting Sensing Sensitivity: 0.3 mV

## 2021-01-20 NOTE — Telephone Encounter (Signed)
Last OV 06/14/19

## 2021-01-22 ENCOUNTER — Other Ambulatory Visit (HOSPITAL_COMMUNITY): Payer: Self-pay | Admitting: Cardiology

## 2021-01-22 ENCOUNTER — Other Ambulatory Visit: Payer: Self-pay | Admitting: Internal Medicine

## 2021-01-22 DIAGNOSIS — K219 Gastro-esophageal reflux disease without esophagitis: Secondary | ICD-10-CM

## 2021-01-22 NOTE — Telephone Encounter (Signed)
   Notes to clinic:  last filled on 01/22/2021 Overdue for follow up Called patient no answer    Requested Prescriptions  Pending Prescriptions Disp Refills   omeprazole (PRILOSEC) 40 MG capsule [Pharmacy Med Name: OMEPRAZOLE 40 MG ORAL CAPSULE DELAYED RELEASE] 90 capsule 1    Sig: TAKE 1 CAPSULE (40 MG TOTAL) BY MOUTH DAILY FOR ACID REFLUX     Gastroenterology: Proton Pump Inhibitors Failed - 01/22/2021 11:30 AM      Failed - Valid encounter within last 12 months    Recent Outpatient Visits           12 months ago Type 2 diabetes mellitus with diabetic polyneuropathy, with long-term current use of insulin ([place])   [place], Deborah B, MD   1 year ago Chronic systolic CHF (congestive heart failure) [facility])   [place] Ladell Pier, MD   1 year ago Chronic systolic CHF (congestive heart failure) [facility])   [place] Ladell Pier, MD   1 year ago Uncontrolled type 2 diabetes mellitus with peripheral neuropathy [facility])   [place] Ladell Pier, MD   1 year ago Encounter for medication counseling   [place], RPH-CPP       Future Appointments             In 4 days Laurin Coder, MD [facility]monary [facility]

## 2021-01-26 ENCOUNTER — Other Ambulatory Visit: Payer: Self-pay

## 2021-01-26 ENCOUNTER — Encounter: Payer: Self-pay | Admitting: Pulmonary Disease

## 2021-01-26 ENCOUNTER — Ambulatory Visit (INDEPENDENT_AMBULATORY_CARE_PROVIDER_SITE_OTHER): Payer: Medicaid Other | Admitting: Pulmonary Disease

## 2021-01-26 ENCOUNTER — Telehealth: Payer: Self-pay | Admitting: Pulmonary Disease

## 2021-01-26 VITALS — BP 118/70 | HR 63 | Temp 98.1°F | Ht 67.5 in | Wt 350.2 lb

## 2021-01-26 DIAGNOSIS — J9611 Chronic respiratory failure with hypoxia: Secondary | ICD-10-CM

## 2021-01-26 DIAGNOSIS — R0602 Shortness of breath: Secondary | ICD-10-CM | POA: Diagnosis not present

## 2021-01-26 DIAGNOSIS — J9612 Chronic respiratory failure with hypercapnia: Secondary | ICD-10-CM

## 2021-01-26 DIAGNOSIS — J449 Chronic obstructive pulmonary disease, unspecified: Secondary | ICD-10-CM | POA: Diagnosis not present

## 2021-01-26 LAB — PULMONARY FUNCTION TEST
DL/VA % pred: 113 %
DL/VA: 4.64 ml/min/mmHg/L
DLCO cor % pred: 50 %
DLCO cor: 11.33 ml/min/mmHg
DLCO unc % pred: 50 %
DLCO unc: 11.33 ml/min/mmHg
FEF 25-75 Post: 0.74 L/sec
FEF 25-75 Pre: 0.67 L/sec
FEF2575-%Change-Post: 9 %
FEF2575-%Pred-Post: 32 %
FEF2575-%Pred-Pre: 29 %
FEV1-%Change-Post: 2 %
FEV1-%Pred-Post: 43 %
FEV1-%Pred-Pre: 42 %
FEV1-Post: 1.04 L
FEV1-Pre: 1.01 L
FEV1FVC-%Change-Post: 5 %
FEV1FVC-%Pred-Pre: 90 %
FEV6-%Change-Post: -1 %
FEV6-%Pred-Post: 46 %
FEV6-%Pred-Pre: 47 %
FEV6-Post: 1.38 L
FEV6-Pre: 1.4 L
FEV6FVC-%Change-Post: 0 %
FEV6FVC-%Pred-Post: 103 %
FEV6FVC-%Pred-Pre: 102 %
FVC-%Change-Post: -2 %
FVC-%Pred-Post: 45 %
FVC-%Pred-Pre: 46 %
FVC-Post: 1.38 L
FVC-Pre: 1.41 L
Post FEV1/FVC ratio: 76 %
Post FEV6/FVC ratio: 100 %
Pre FEV1/FVC ratio: 72 %
Pre FEV6/FVC Ratio: 99 %
RV % pred: 115 %
RV: 2.52 L
TLC % pred: 70 %
TLC: 3.93 L

## 2021-01-26 MED ORDER — PREDNISONE 10 MG PO TABS: ORAL_TABLET | ORAL | 0 refills | Status: DC

## 2021-01-26 NOTE — Progress Notes (Signed)
PFT done today. 

## 2021-01-26 NOTE — Progress Notes (Signed)
Tamara Silva    [id number]    04-Aug-1960  Primary Care Physician:Johnson, Dalbert Batman, MD  Referring Physician: Ladell Pier, MD [address] [place],  [place] [address]  Chief complaint:   Patient in for follow-up for COPD  HPI:  Many years of COPD History of obstructive sleep apnea/obesity hypoventilation syndrome on nocturnal trilogy ventilator Chronic respiratory failure on oxygen supplementation she has a history of congestive heart failure, coronary artery disease PEA arrest in 2020 post ICD  Echocardiogram showing ejection fraction of 30 to 35%  Previous spirometry 01/18/2015 showed FEV1 of 34% She did have a repeat PFT showing FEV1 of 42% currently  Limited with activities of daily living only limited to about a block ambulation  Currently uses Anoro, albuterol as needed  Breathing is okay, short of breath with activities Vaccinated against COVID  Remains under percent compliant with trilogy use  She is trying to switch to a vegan diet to help her lose weight   Outpatient Encounter Medications as of 01/26/2021  Medication Sig   acetaminophen (TYLENOL) 500 MG tablet Take 500 mg by mouth every 6 (six) hours as needed for moderate pain or headache.   ANORO ELLIPTA 62.5-25 MCG/INH AEPB Inhale 1 puff into the lungs daily.   ascorbic acid (VITAMIN C) 500 MG tablet TAKE ONE TABLET BY MOUTH ONCE DAILY   ASPIRIN LOW DOSE 81 MG EC tablet CHEW 1 TABLET (81 MG TOTAL) BY MOUTH DAILY.   BIDIL 20-37.5 MG tablet TAKE 1 TABLET BY MOUTH 3 (THREE) TIMES DAILY.   bisoprolol (ZEBETA) 5 MG tablet TAKE 1 TABLET (5 MG TOTAL) BY MOUTH DAILY.   Blood Glucose Monitoring Suppl (ACCU-CHEK GUIDE) w/Device KIT Use accu chek guide to check blood sugar three times daily.   FEROSUL 325 (65 Fe) MG tablet TAKE 1 TABLET (325 MG TOTAL) BY MOUTH DAILY AFTER SUPPER. PLEASE TAKE WITH A SOURCE OF VITAMIN C   fluticasone (FLONASE) 50 MCG/ACT nasal spray PLACE 1 SPRAY INTO BOTH NOSTRILS  DAILY.   glucose blood (ACCU-CHEK AVIVA PLUS) test strip USE 1 STRIP TO CHECK BLOOD SUGAR 3 TIMES A DAY .   hydrocerin (EUCERIN) CREA Apply 1 application topically 2 (two) times daily.   insulin degludec (TRESIBA FLEXTOUCH) 200 UNIT/ML FlexTouch Pen INJECT 80 UNITS UNDER THE SKIN ONCE DAILY  LAST FOR 45 DAYS   Insulin Pen Needle (EASY COMFORT PEN NEEDLES) 31G X 5 MM MISC USE 3 TIMES A DAY FOR INSULIN ADMINISTRATION   ipratropium-albuterol (DUONEB) 0.5-2.5 (3) MG/3ML SOLN INHALE CONTENTS OF ONE VIAL USING NEBULIZER EVERY 6 HOURS AS NEEDED FOR SHORTNESS OF BREATH, WHEEZING   metolazone (ZAROXOLYN) 2.5 MG tablet Take 1 tablet (2.5 mg total) by mouth once a week. APPT NEEDED FOR FURTHER REFILLS. PLEASE CALL [phone]   NEEDLE, DISP, 26 G 26G X 1/2" MISC 1 application by Does not apply route daily.   NOVOLOG FLEXPEN 100 UNIT/ML FlexPen Inject 20 units under the skin at breakfast and lunch, and 24 units at dinner.   omeprazole (PRILOSEC) 40 MG capsule TAKE 1 CAPSULE (40 MG TOTAL) BY MOUTH DAILY.   potassium chloride SA (KLOR-CON) 20 MEQ tablet TAKE 2 TABLETS (40 MEQ TOTAL) BY MOUTH DAILY.   PROAIR HFA 108 (90 Base) MCG/ACT inhaler INHALE 2 PUFFS BY MOUTH 4 TIMES DAILY AS NEEDED   RESTASIS 0.05 % ophthalmic emulsion Place 1 drop into both eyes daily as needed.    rosuvastatin (CRESTOR) 40 MG tablet TAKE  1 TABLET (40 MG TOTAL) BY MOUTH DAILY AT 6 PM.   sacubitril-valsartan (ENTRESTO) 24-26 MG Take 1 tablet by mouth 2 (two) times daily. APPT NEEDED FOR FURTHER REFILLS. PLEASE CALL [phone]   spironolactone (ALDACTONE) 25 MG tablet Take 1 tablet by mouth daily.   Tiotropium Bromide-Olodaterol (STIOLTO RESPIMAT) 2.5-2.5 MCG/ACT AERS Inhale 2 puffs into the lungs daily.   torsemide (DEMADEX) 100 MG tablet TAKE 1 TABLET (100 MG TOTAL) BY MOUTH 2 (TWO) TIMES DAILY.   VICTOZA 18 MG/3ML SOPN INJECT 1.8 MG INTO THE SKIN DAILY.   vitamin B-12 (CYANOCOBALAMIN) 1000 MCG tablet Take 1,000 mcg by mouth daily.    Vitamin D, Ergocalciferol, (DRISDOL) 1.25 MG (50000 UNIT) CAPS capsule Take 50,000 Units by mouth once a week.   No facility-administered encounter medications on file as of 01/26/2021.    Allergies as of 01/26/2021 - Review Complete 01/26/2021  Allergen Reaction Noted   Metolazone Other (See Comments) 07/10/2013   Plavix [clopidogrel bisulfate] Other (See Comments) 10/08/2013   Ibuprofen Other (See Comments) 01/23/2018   Nsaids Other (See Comments) 11/27/2012    Past Medical History:  Diagnosis Date   AICD (automatic cardioverter/defibrillator) present 04/04/2019   Asthma    CAD (coronary artery disease)    Chronic combined systolic (congestive) and diastolic (congestive) heart failure (HCC)    Chronic iron deficiency anemia    CVA (cerebral vascular accident) ([place]) 05/2018   Diabetes mellitus without complication ([place])    DOE (dyspnea on exertion)    Edema of both legs    Hepatic steatosis    Hypertension    Pulmonary emboli ([place]) 2014   Respiratory failure ([place]) 09/2018   Seizure ([place]) 05/2018    Past Surgical History:  Procedure Laterality Date   BIOPSY  07/10/2018   Procedure: BIOPSY;  Surgeon: [name], MD;  Location: [facility];  Service: Gastroenterology;;   COLONOSCOPY WITH PROPOFOL N/A 08/03/2013   Procedure: COLONOSCOPY WITH PROPOFOL;  Surgeon: Jeryl Columbia, MD;  Location[facility];  Service: Endoscopy;  Laterality: N/A;   COLONOSCOPY WITH PROPOFOL N/A 07/10/2018   Procedure: COLONOSCOPY WITH PROPOFOL;  Surgeon: [name], MD;  Location: [place];  Service: Gastroenterology;  Laterality: N/A;   CORONARY ANGIOPLASTY WITH STENT PLACEMENT     CAD in 2006 x 2 and 2009 2 more- place din REx in [place] [facility] [place] [facility]   CORONARY ANGIOPLASTY WITH STENT PLACEMENT  10/07/2013   Xience [place] DES 2.75  mm x 15  mm   ESOPHAGOGASTRODUODENOSCOPY (EGD) WITH PROPOFOL N/A 08/03/2013   Procedure: ESOPHAGOGASTRODUODENOSCOPY (EGD) WITH PROPOFOL;  Surgeon: Jeryl Columbia, MD;   [facility];  Service: Endoscopy;  Laterality: N/A;   ESOPHAGOGASTRODUODENOSCOPY (EGD) WITH PROPOFOL N/A 07/10/2018   Procedure: ESOPHAGOGASTRODUODENOSCOPY (EGD) WITH PROPOFOL;  Surgeon: [name], MD;  Location: [place];  Service: Gastroenterology;  Laterality: N/A;   ICD IMPLANT  04/04/2019   ICD IMPLANT N/A 04/04/2019   Procedure: ICD IMPLANT;  Surgeon: Constance Haw, MD;  Location: [place] CV LAB;  Service: Cardiovascular;  Laterality: N/A;   LEFT HEART CATHETERIZATION WITH CORONARY ANGIOGRAM N/A 10/07/2013   Procedure: LEFT HEART CATHETERIZATION WITH CORONARY ANGIOGRAM;  Surgeon: Leonie Man, MD;  Location: [facility] CATH LAB;  Service: Cardiovascular;  Laterality: N/A;   RIGHT HEART CATH N/A 11/06/2018   Procedure: RIGHT HEART CATH;  Surgeon: Larey Dresser, MD;  Location: [place] CV LAB;  Service: Cardiovascular;  Laterality: N/A;   RIGHT/LEFT HEART CATH AND CORONARY ANGIOGRAPHY N/A 02/12/2019  Procedure: RIGHT/LEFT HEART CATH AND CORONARY ANGIOGRAPHY;  Surgeon: Larey Dresser, MD;  Location: [place] CV LAB;  Service: Cardiovascular;  Laterality: N/A;    Family History  Problem Relation Age of Onset   Stroke Mother    Hypertension Mother    Colon cancer Father    Diabetes Father    Heart attack Father    Sarcoidosis Other    Breast cancer Paternal Aunt    Emphysema Brother    Lung disease Brother        Unknown type, 3 brothers, one with liver and lung disease   Diabetes Brother    Asthma Paternal Aunt    Diabetes Sister     Social History   Socioeconomic History   Marital status: Single    Spouse name: Not on file   Number of children: 1   Years of education: Not on file   Highest education level: Not on file  Occupational History   Not on file  Tobacco Use   Smoking status: Former    Packs/day: 0.50    Years: 20.00    Pack years: 10.00    Types: Cigarettes    Quit date: 05/31/2018    Years since quitting: 2.6   Smokeless  tobacco: Never   Tobacco comments:    smoked off and on x 20 years  Vaping Use   Vaping Use: Never used  Substance and Sexual Activity   Alcohol use: No    Alcohol/week: 0.0 standard drinks   Drug use: Not Currently    Types: Cocaine   Sexual activity: Not Currently  Other Topics Concern   Not on file  Social History Narrative   Origibnally from [employer]   Most recently from [place]   Daughter lives in town      On Social security to CHF, COPD, CAD   Social Determinants of Health   Financial Resource Strain: Not on file  Food Insecurity: Not on file  Transportation Needs: Not on file  Physical Activity: Not on file  Stress: Not on file  Social Connections: Not on file  Intimate Partner Violence: Not on file    Review of Systems  Constitutional:  Positive for fatigue.  Respiratory:  Positive for shortness of breath.   Cardiovascular:  Negative for leg swelling.  Psychiatric/Behavioral:  Positive for sleep disturbance.    Vitals:   01/26/21 1203  BP: 118/70  Pulse: 63  Temp: 98.1 F (36.7 C)  SpO2: 98%     Physical Exam Constitutional:      Appearance: She is obese.  HENT:     Head: Normocephalic.     Nose: No congestion or rhinorrhea.     Mouth/Throat:     Pharynx: No oropharyngeal exudate.  Eyes:     Pupils: Pupils are equal, round, and reactive to light.  Cardiovascular:     Rate and Rhythm: Normal rate and regular rhythm.     Heart sounds: No murmur heard.   No friction rub.  Pulmonary:     Effort: No respiratory distress.     Breath sounds: No stridor. No wheezing or rhonchi.  Musculoskeletal:     Cervical back: No rigidity or tenderness.  Neurological:     Mental Status: She is alert.  Psychiatric:        Mood and Affect: Mood normal.   Data Reviewed: Pulmonary function test reviewed with the patient showing combined obstructive disease and restrictive disease  Echocardiogram 03/15/2019 shows ejection fraction of 30  to 35%  Assessment:   Chronic obstructive pulmonary disease  Obesity hypoventilation syndrome/obstructive sleep apnea  Chronic respiratory failure, on oxygen supplementation at 3 L  Chronic combined systolic and diastolic heart failure  Stage III chronic kidney disease   Plan/Recommendations: Continue bronchodilators-on Anoro  Encouraged weight loss efforts  Encouraged to continue using trilogy on a nightly basis  Nebulization treatments as needed  Follow-up in 6 months  Encouraged to call with any significant concerns  I spent 30 minutes dedicated to the care of this patient on the date of this encounter to include previsit review of records, face-to-face time with the patient discussing conditions above, post visit ordering of testing, clinical documentation with electronic health record and communicated necessary findings to members of the patient's care team  Sherrilyn Rist MD [place] 01/26/2021, 12:33 PM  CC: Ladell Pier, MD

## 2021-01-26 NOTE — Patient Instructions (Signed)
Continue weight loss efforts  We will send a prescription for 10 mg prednisone to be used daily for about 7 days  Continue Anoro  Continue trilogy ventilator  Call with significant concerns  I will see you in 6 months

## 2021-01-30 ENCOUNTER — Other Ambulatory Visit: Payer: Self-pay | Admitting: Endocrinology

## 2021-01-30 ENCOUNTER — Telehealth: Payer: Self-pay | Admitting: Endocrinology

## 2021-01-30 DIAGNOSIS — IMO0002 Reserved for concepts with insufficient information to code with codable children: Secondary | ICD-10-CM

## 2021-01-30 DIAGNOSIS — E1165 Type 2 diabetes mellitus with hyperglycemia: Secondary | ICD-10-CM

## 2021-01-30 DIAGNOSIS — E1142 Type 2 diabetes mellitus with diabetic polyneuropathy: Secondary | ICD-10-CM

## 2021-01-30 NOTE — Telephone Encounter (Signed)
Is this okay to refill? 

## 2021-01-30 NOTE — Telephone Encounter (Signed)
Pt states that she does not have her insulin because she has to wait 16 days per pharmacy so insurance will pay for it pt voiced that she has been taking more than supposed to because her sugars have been high . Pt states  has not had her insulin for the past week. Sugars have been running high for 2 weeks.sugar at 10:20 on 01/30/2021 is 335 and pt has not eat anything today.    [place] 100 UNIT/ML [place], [place]

## 2021-01-30 NOTE — Telephone Encounter (Signed)
Called and lvm for patient call us back regarding medications and also informed patient that she needs to set up an appt as well.

## 2021-01-30 NOTE — Telephone Encounter (Signed)
Called to let patient know , she said that she has gotten this from her PCP

## 2021-01-30 NOTE — Telephone Encounter (Signed)
Larena Glassman with [facility] ph# [phone] called re: Patient has been self dosing her insulin. Larena Glassman has told Patient that she needs to make an appointment with Dr. Jonni Sanger enforced to make follow appointment-Patient hung up on Trisha. Dr. Darrol Jump with [facility] is now Patient's Primary Care Physician. Larena Glassman states that Patient has been attempting to make Dr. Jimmye Norman treat and manage Patient's Diabetes. Larena Glassman states Dr. Jimmye Norman has told Patient Dr. Dwyane Dee treats and manages Patient's Diabetes.Larena Glassman states Patient's A1C was 10.2 on 12/25/20 [facility]).

## 2021-02-03 ENCOUNTER — Other Ambulatory Visit: Payer: Self-pay | Admitting: Internal Medicine

## 2021-02-04 NOTE — Progress Notes (Signed)
Remote ICD transmission.   

## 2021-02-04 NOTE — Telephone Encounter (Signed)
Patient must have an office visit for further refills. Courtesy refill. Requested Prescriptions  Pending Prescriptions Disp Refills  . ACCU-CHEK AVIVA PLUS test strip [Pharmacy Med Name: ACCU-CHEK AVIVA PLUS IN VITRO STRIP] 100 each 0    Sig: USE 1 STRIP TO CHECK BLOOD SUGAR 3 TIMES A DAY .     Endocrinology: Diabetes - Testing Supplies Failed - 02/03/2021 10:49 AM      Failed - Valid encounter within last 12 months    Recent Outpatient Visits          1 year ago Type 2 diabetes mellitus with diabetic polyneuropathy, with long-term current use of insulin ([place])   [place], Deborah B, MD   1 year ago Chronic systolic CHF (congestive heart failure) [facility])   [place], MD   1 year ago Chronic systolic CHF (congestive heart failure) [facility])   [place] Ladell Pier, MD   1 year ago Uncontrolled type 2 diabetes mellitus with peripheral neuropathy [facility])   [place] Ladell Pier, MD   1 year ago Encounter for medication counseling   [place], RPH-CPP

## 2021-02-05 ENCOUNTER — Other Ambulatory Visit (HOSPITAL_COMMUNITY): Payer: Self-pay | Admitting: Cardiology

## 2021-02-08 ENCOUNTER — Encounter (HOSPITAL_COMMUNITY): Payer: Self-pay | Admitting: Emergency Medicine

## 2021-02-08 ENCOUNTER — Other Ambulatory Visit: Payer: Self-pay

## 2021-02-08 ENCOUNTER — Emergency Department (HOSPITAL_COMMUNITY)
Admission: EM | Admit: 2021-02-08 | Discharge: 2021-02-09 | Disposition: A | Discharge status: Home / Self Care | Payer: Medicaid Other | Attending: Emergency Medicine | Admitting: Emergency Medicine

## 2021-02-08 DIAGNOSIS — Z87891 Personal history of nicotine dependence: Secondary | ICD-10-CM | POA: Insufficient documentation

## 2021-02-08 DIAGNOSIS — J449 Chronic obstructive pulmonary disease, unspecified: Secondary | ICD-10-CM | POA: Insufficient documentation

## 2021-02-08 DIAGNOSIS — R109 Unspecified abdominal pain: Secondary | ICD-10-CM | POA: Insufficient documentation

## 2021-02-08 DIAGNOSIS — E1140 Type 2 diabetes mellitus with diabetic neuropathy, unspecified: Secondary | ICD-10-CM | POA: Insufficient documentation

## 2021-02-08 DIAGNOSIS — R0781 Pleurodynia: Secondary | ICD-10-CM | POA: Insufficient documentation

## 2021-02-08 DIAGNOSIS — N183 Chronic kidney disease, stage 3 unspecified: Secondary | ICD-10-CM | POA: Diagnosis not present

## 2021-02-08 DIAGNOSIS — Z7982 Long term (current) use of aspirin: Secondary | ICD-10-CM | POA: Diagnosis not present

## 2021-02-08 DIAGNOSIS — I130 Hypertensive heart and chronic kidney disease with heart failure and stage 1 through stage 4 chronic kidney disease, or unspecified chronic kidney disease: Secondary | ICD-10-CM | POA: Diagnosis not present

## 2021-02-08 DIAGNOSIS — Z794 Long term (current) use of insulin: Secondary | ICD-10-CM | POA: Insufficient documentation

## 2021-02-08 DIAGNOSIS — I5042 Chronic combined systolic (congestive) and diastolic (congestive) heart failure: Secondary | ICD-10-CM | POA: Diagnosis not present

## 2021-02-08 DIAGNOSIS — I25119 Atherosclerotic heart disease of native coronary artery with unspecified angina pectoris: Secondary | ICD-10-CM | POA: Insufficient documentation

## 2021-02-08 DIAGNOSIS — Z5321 Procedure and treatment not carried out due to patient leaving prior to being seen by health care provider: Secondary | ICD-10-CM | POA: Insufficient documentation

## 2021-02-08 DIAGNOSIS — Z79899 Other long term (current) drug therapy: Secondary | ICD-10-CM | POA: Diagnosis not present

## 2021-02-08 DIAGNOSIS — E1122 Type 2 diabetes mellitus with diabetic chronic kidney disease: Secondary | ICD-10-CM | POA: Insufficient documentation

## 2021-02-08 DIAGNOSIS — J45909 Unspecified asthma, uncomplicated: Secondary | ICD-10-CM | POA: Diagnosis not present

## 2021-02-08 LAB — URINALYSIS, ROUTINE W REFLEX MICROSCOPIC
Bilirubin Urine: NEGATIVE
Glucose, UA: NEGATIVE mg/dL
Hgb urine dipstick: NEGATIVE
Ketones, ur: NEGATIVE mg/dL
Leukocytes,Ua: NEGATIVE
Nitrite: NEGATIVE
Protein, ur: NEGATIVE mg/dL
Specific Gravity, Urine: 1.01 (ref 1.005–1.030)
pH: 5.5 (ref 5.0–8.0)

## 2021-02-08 LAB — COMPREHENSIVE METABOLIC PANEL
ALT: 12 U/L (ref 0–44)
AST: 11 U/L — ABNORMAL LOW (ref 15–41)
Albumin: 3.6 g/dL (ref 3.5–5.0)
Alkaline Phosphatase: 63 U/L (ref 38–126)
Anion gap: 11 (ref 5–15)
BUN: 84 mg/dL — ABNORMAL HIGH (ref 8–23)
CO2: 28 mmol/L (ref 22–32)
Calcium: 9.5 mg/dL (ref 8.9–10.3)
Chloride: 101 mmol/L (ref 98–111)
Creatinine, Ser: 2.4 mg/dL — ABNORMAL HIGH (ref 0.44–1.00)
GFR, Estimated: 22 mL/min — ABNORMAL LOW (ref >60)
Glucose, Bld: 92 mg/dL (ref 70–99)
Potassium: 4.9 mmol/L (ref 3.5–5.1)
Sodium: 140 mmol/L (ref 135–145)
Total Bilirubin: 0.5 mg/dL (ref 0.3–1.2)
Total Protein: 8 g/dL (ref 6.5–8.1)

## 2021-02-08 LAB — CBC
HCT: 35 % — ABNORMAL LOW (ref 36.0–46.0)
Hemoglobin: 10.5 g/dL — ABNORMAL LOW (ref 12.0–15.0)
MCH: 25.7 pg — ABNORMAL LOW (ref 26.0–34.0)
MCHC: 30 g/dL (ref 30.0–36.0)
MCV: 85.6 fL (ref 80.0–100.0)
Platelets: 270 10*3/uL (ref 150–400)
RBC: 4.09 MIL/uL (ref 3.87–5.11)
RDW: 17.1 % — ABNORMAL HIGH (ref 11.5–15.5)
WBC: 11.3 10*3/uL — ABNORMAL HIGH (ref 4.0–10.5)
nRBC: 0 % (ref 0.0–0.2)

## 2021-02-08 LAB — LIPASE, BLOOD: Lipase: 43 U/L (ref 11–51)

## 2021-02-08 NOTE — ED Triage Notes (Signed)
Pt BIB [facility] from home, c/o LUQ pain x 2 weeks. States it improves with tylenol, but comes back. Denies nausea/vomiting.

## 2021-02-08 NOTE — ED Notes (Signed)
Pt did not want to wait Pt left AMA.

## 2021-02-08 NOTE — ED Notes (Signed)
Pt refused EKG. Stating "I don't need no EKG I want to know what is wrong with my side" Triage RN and PA aware.

## 2021-02-09 ENCOUNTER — Emergency Department (HOSPITAL_BASED_OUTPATIENT_CLINIC_OR_DEPARTMENT_OTHER): Payer: Medicaid Other | Admitting: Radiology

## 2021-02-09 ENCOUNTER — Encounter (HOSPITAL_BASED_OUTPATIENT_CLINIC_OR_DEPARTMENT_OTHER): Payer: Self-pay | Admitting: Emergency Medicine

## 2021-02-09 ENCOUNTER — Emergency Department (HOSPITAL_BASED_OUTPATIENT_CLINIC_OR_DEPARTMENT_OTHER)
Admission: EM | Admit: 2021-02-09 | Discharge: 2021-02-09 | Disposition: A | Discharge status: Home / Self Care | Payer: Medicaid Other | Source: Home / Self Care | Attending: Emergency Medicine | Admitting: Emergency Medicine

## 2021-02-09 DIAGNOSIS — Z794 Long term (current) use of insulin: Secondary | ICD-10-CM | POA: Insufficient documentation

## 2021-02-09 DIAGNOSIS — E1122 Type 2 diabetes mellitus with diabetic chronic kidney disease: Secondary | ICD-10-CM | POA: Insufficient documentation

## 2021-02-09 DIAGNOSIS — I5042 Chronic combined systolic (congestive) and diastolic (congestive) heart failure: Secondary | ICD-10-CM | POA: Insufficient documentation

## 2021-02-09 DIAGNOSIS — I130 Hypertensive heart and chronic kidney disease with heart failure and stage 1 through stage 4 chronic kidney disease, or unspecified chronic kidney disease: Secondary | ICD-10-CM | POA: Insufficient documentation

## 2021-02-09 DIAGNOSIS — R0781 Pleurodynia: Secondary | ICD-10-CM

## 2021-02-09 DIAGNOSIS — Z7982 Long term (current) use of aspirin: Secondary | ICD-10-CM | POA: Insufficient documentation

## 2021-02-09 DIAGNOSIS — I25119 Atherosclerotic heart disease of native coronary artery with unspecified angina pectoris: Secondary | ICD-10-CM | POA: Insufficient documentation

## 2021-02-09 DIAGNOSIS — J449 Chronic obstructive pulmonary disease, unspecified: Secondary | ICD-10-CM | POA: Insufficient documentation

## 2021-02-09 DIAGNOSIS — E1140 Type 2 diabetes mellitus with diabetic neuropathy, unspecified: Secondary | ICD-10-CM | POA: Insufficient documentation

## 2021-02-09 DIAGNOSIS — N183 Chronic kidney disease, stage 3 unspecified: Secondary | ICD-10-CM | POA: Insufficient documentation

## 2021-02-09 DIAGNOSIS — Z87891 Personal history of nicotine dependence: Secondary | ICD-10-CM | POA: Insufficient documentation

## 2021-02-09 DIAGNOSIS — Z79899 Other long term (current) drug therapy: Secondary | ICD-10-CM | POA: Insufficient documentation

## 2021-02-09 DIAGNOSIS — J45909 Unspecified asthma, uncomplicated: Secondary | ICD-10-CM | POA: Insufficient documentation

## 2021-02-09 MED ORDER — TRAMADOL HCL 50 MG PO TABS
50.0000 mg | ORAL_TABLET | Freq: Once | ORAL | Status: AC
  Administered 2021-02-09: 50 mg via ORAL
  Filled 2021-02-09: qty 1

## 2021-02-09 MED ORDER — TRAMADOL HCL 50 MG PO TABS: 50.0000 mg | ORAL_TABLET | Freq: Two times a day (BID) | ORAL | 0 refills | Status: AC | PRN

## 2021-02-09 NOTE — Discharge Instructions (Signed)
You have been seen and discharged from the emergency department.  Your x-ray showed no rib fracture however I believe you are still suffering from musculoskeletal pain from coughing.  Take pain medicine as needed.  Do not mix this medication with alcohol or other sedating medications. Do not drive or do heavy physical activity and to know how this medication affects you.  It may cause drowsiness.  Follow-up with your primary provider for reevaluation and further care. Take home medications as prescribed. If you have any worsening symptoms or further concerns for your health please return to an emergency department for further evaluation.

## 2021-02-09 NOTE — ED Notes (Signed)
Pt verbalized understanding of dc instructions.

## 2021-02-09 NOTE — ED Provider Notes (Signed)
[facility] EMERGENCY DEPT Provider Note   CSN: [id number] Arrival date & time: 02/09/21  6203     History Chief Complaint  Patient presents with   Flank Pain    Tamara Silva is a 60 y.o. female.  HPI  60 year old female with past medical history of morbid obesity, CAD, CVA, DM, HTN, HLD presents the emergency department with left-sided rib pain.  Patient states about 3 weeks ago when she was coughing she had a popping sensation in her left lower/lateral ribs.  Since then she has had pain in that area, specifically with coughing and movement.  Denies any central chest pain, shortness of breath, chest heaviness.  Denies any back/flank pain.    Past Medical History:  Diagnosis Date   AICD (automatic cardioverter/defibrillator) present 04/04/2019   Asthma    CAD (coronary artery disease)    Chronic combined systolic (congestive) and diastolic (congestive) heart failure (HCC)    Chronic iron deficiency anemia    CVA (cerebral vascular accident) ([place]) 05/2018   Diabetes mellitus without complication ([place])    DOE (dyspnea on exertion)    Edema of both legs    Hepatic steatosis    Hypertension    Pulmonary emboli ([place]) 2014   Respiratory failure ([place]) 09/2018   Seizure ([place]) 05/2018    Patient Active Problem List   Diagnosis Date Noted   NPDR (nonproliferative diabetic retinopathy) ([place]) 08/06/2020   Anemia of chronic disease 01/25/2020   Coronary artery disease involving native coronary artery of native heart with angina pectoris ([place]) 55/97/4163   Chronic systolic (congestive) heart failure ([place]) 04/04/2019   Diabetic peripheral neuropathy ([name]) 01/19/2019   Former smoker 01/19/2019   CKD (chronic kidney disease) stage 3, GFR 30-59 ml/min ([place]) 01/19/2019   Cardiac arrest ([place]) 10/28/2018   Noncompliance with treatment plan 07/24/2018   General patient noncompliance 07/24/2018   Noncompliance with diet and medication regimen 07/24/2018   Dysarthria     BiPAP (biphasic positive airway pressure) dependence    Type II diabetes mellitus with renal manifestations ([place]) 07/08/2018   Hepatic steatosis 06/22/2018   Chronic respiratory failure with hypoxia and hypercapnia ([place])    On home O2    Slow transit constipation    Cocaine abuse ([place])    Diabetes mellitus type 2 in obese ([place])    Cerebral embolism with cerebral infarction 06/01/2018   Pressure injury of skin 05/30/2018   Iron deficiency anemia 01/27/2018   Tobacco abuse disorder 10/03/2017   Dyslipidemia 04/29/2016   Primary insomnia 04/29/2016   Obesity hypoventilation syndrome (HCC)    OSA (obstructive sleep apnea)    Dyslipidemia associated with type 2 diabetes mellitus ([place]) 05/21/2015   COPD GOLD III  06/10/2014   CAD S/P percutaneous coronary angioplasty - prior PCI to LAD; RCA PCI: new Xience Alpine DES 2.75 mm x 15 mm  10/07/2013   Morbid obesity ([place]) 07/30/2013   Chronic combined systolic and diastolic heart failure ([place]) 07/10/2013   Bipolar disorder ([place]) 02/08/2013   Essential hypertension 02/08/2013   COPD (chronic obstructive pulmonary disease) ([place]) 10/15/2012    Past Surgical History:  Procedure Laterality Date   BIOPSY  07/10/2018   Procedure: BIOPSY;  Surgeon: [name], MD;  Location: [facility];  Service: Gastroenterology;;   COLONOSCOPY WITH PROPOFOL N/A 08/03/2013   Procedure: COLONOSCOPY WITH PROPOFOL;  Surgeon: Jeryl Columbia, MD;  Location: [facility];  Service: Endoscopy;  Laterality: N/A;   COLONOSCOPY WITH PROPOFOL N/A 07/10/2018   Procedure: COLONOSCOPY WITH PROPOFOL;  Surgeon: [name], MD;  Location: [facility];  Service: Gastroenterology;  Laterality: N/A;   CORONARY ANGIOPLASTY WITH STENT PLACEMENT     CAD in 2006 x 2 and 2009 2 more- place din REx in [place] [facility] [place] [facility]   CORONARY ANGIOPLASTY WITH STENT PLACEMENT  10/07/2013   Xience Alpine DES 2.75  mm x 15  mm   ESOPHAGOGASTRODUODENOSCOPY (EGD) WITH PROPOFOL N/A 08/03/2013   Procedure:  ESOPHAGOGASTRODUODENOSCOPY (EGD) WITH PROPOFOL;  Surgeon: Jeryl Columbia, MD;  [facility]: [facility];  Service: Endoscopy;  Laterality: N/A;   ESOPHAGOGASTRODUODENOSCOPY (EGD) WITH PROPOFOL N/A 07/10/2018   Procedure: ESOPHAGOGASTRODUODENOSCOPY (EGD) WITH PROPOFOL;  Surgeon: [name], MD;  Location: [facility];  Service: Gastroenterology;  Laterality: N/A;   ICD IMPLANT  04/04/2019   ICD IMPLANT N/A 04/04/2019   Procedure: ICD IMPLANT;  Surgeon: Constance Haw, MD;  Location: [facility];  Service: Cardiovascular;  Laterality: N/A;   LEFT HEART CATHETERIZATION WITH CORONARY ANGIOGRAM N/A 10/07/2013   Procedure: LEFT HEART CATHETERIZATION WITH CORONARY ANGIOGRAM;  Surgeon: Leonie Man, MD;  Location: [facility] CATH LAB;  Service: Cardiovascular;  Laterality: N/A;   RIGHT HEART CATH N/A 11/06/2018   Procedure: RIGHT HEART CATH;  Surgeon: Larey Dresser, MD;  Location: [facility] LAB;  Service: Cardiovascular;  Laterality: N/A;   RIGHT/LEFT HEART CATH AND CORONARY ANGIOGRAPHY N/A 02/12/2019   Procedure: RIGHT/LEFT HEART CATH AND CORONARY ANGIOGRAPHY;  Surgeon: Larey Dresser, MD;  Location: [facility];  Service: Cardiovascular;  Laterality: N/A;     OB History   No obstetric history on file.     Family History  Problem Relation Age of Onset   Stroke Mother    Hypertension Mother    Colon cancer Father    Diabetes Father    Heart attack Father    Sarcoidosis Other    Breast cancer Paternal Aunt    Emphysema Brother    Lung disease Brother        Unknown type, 3 brothers, one with liver and lung disease   Diabetes Brother    Asthma Paternal Aunt    Diabetes Sister     Social History   Tobacco Use   Smoking status: Former    Packs/day: 0.50    Years: 20.00    Pack years: 10.00    Types: Cigarettes    Quit date: 05/31/2018    Years since quitting: 2.6   Smokeless tobacco: Never   Tobacco comments:    smoked off and on x 20 years  Vaping Use   Vaping  Use: Never used  Substance Use Topics   Alcohol use: No    Alcohol/week: 0.0 standard drinks   Drug use: Not Currently    Types: Cocaine    Home Medications Prior to Admission medications   Medication Sig Start Date End Date Taking? Authorizing Provider  ACCU-CHEK AVIVA PLUS test strip USE 1 STRIP TO CHECK BLOOD SUGAR 3 TIMES A DAY . 02/04/21   Ladell Pier, MD  acetaminophen (TYLENOL) 500 MG tablet Take 500 mg by mouth every 6 (six) hours as needed for moderate pain or headache.    [provider]  ANORO ELLIPTA 62.5-25 MCG/INH AEPB Inhale 1 puff into the lungs daily. 10/13/20   [provider]  ascorbic acid (VITAMIN C) 500 MG tablet TAKE ONE TABLET BY MOUTH ONCE DAILY 04/15/20   Ledell Peoples IV, MD  ASPIRIN LOW DOSE 81 MG EC tablet CHEW 1  TABLET (81 MG TOTAL) BY MOUTH DAILY. 11/27/19   Ladell Pier, MD  BIDIL 20-37.5 MG tablet TAKE 1 TABLET BY MOUTH 3 (THREE) TIMES DAILY. 10/17/20   Larey Dresser, MD  bisoprolol (ZEBETA) 5 MG tablet TAKE 1 TABLET (5 MG TOTAL) BY MOUTH DAILY. 04/23/20   Larey Dresser, MD  Blood Glucose Monitoring Suppl (ACCU-CHEK GUIDE) w/Device KIT Use accu chek guide to check blood sugar three times daily. 10/09/19   Elayne Snare, MD  ENTRESTO 24-26 MG TAKE 1 TABLET BY MOUTH 2 (TWO) TIMES DAILY. APPT NEEDED FOR FURTHER REFILLS. PLEASE CALL [phone] 02/06/21   Larey Dresser, MD  FEROSUL 325 (65 Fe) MG tablet TAKE 1 TABLET (325 MG TOTAL) BY MOUTH DAILY AFTER SUPPER. PLEASE TAKE WITH A SOURCE OF VITAMIN C 06/18/20   Orson Slick, MD  fluticasone (FLONASE) 50 MCG/ACT nasal spray PLACE 1 SPRAY INTO BOTH NOSTRILS DAILY. 09/27/19   Ladell Pier, MD  hydrocerin (EUCERIN) CREA Apply 1 application topically 2 (two) times daily. 06/22/18   Love, Ivan Anchors, PA-C  insulin degludec (TRESIBA FLEXTOUCH) 200 UNIT/ML FlexTouch Pen INJECT 80 UNITS UNDER THE SKIN ONCE DAILY  LAST FOR 45 DAYS 04/03/20   Elayne Snare, MD  Insulin Pen Needle (EASY  COMFORT PEN NEEDLES) 31G X 5 MM MISC USE 3 TIMES A DAY FOR INSULIN ADMINISTRATION 10/09/19   Elayne Snare, MD  ipratropium-albuterol (DUONEB) 0.5-2.5 (3) MG/3ML SOLN INHALE CONTENTS OF ONE VIAL USING NEBULIZER EVERY 6 HOURS AS NEEDED FOR SHORTNESS OF BREATH, WHEEZING 07/22/20   Parrett, Fonnie Mu, NP  metolazone (ZAROXOLYN) 2.5 MG tablet Take 1 tablet (2.5 mg total) by mouth once a week. APPT NEEDED FOR FURTHER REFILLS. PLEASE CALL [phone] 12/02/20   Larey Dresser, MD  NEEDLE, DISP, 26 G 26G X 1/2" MISC 1 application by Does not apply route daily. 06/22/18   Love, Ivan Anchors, PA-C  NOVOLOG FLEXPEN 100 UNIT/ML FlexPen Inject 20 units under the skin at breakfast and lunch, and 24 units at dinner. 08/19/20   Elayne Snare, MD  omeprazole (PRILOSEC) 40 MG capsule TAKE 1 CAPSULE (40 MG TOTAL) BY MOUTH DAILY. 06/26/20   Ladell Pier, MD  potassium chloride SA (KLOR-CON) 20 MEQ tablet TAKE 2 TABLETS (40 MEQ TOTAL) BY MOUTH DAILY. 07/15/20   Lorretta Harp, MD  predniSONE (DELTASONE) 10 MG tablet Take 1 pill daily 01/26/21   Olalere, Cicero Duck A, MD  PROAIR HFA 108 (90 Base) MCG/ACT inhaler INHALE 2 PUFFS BY MOUTH 4 TIMES DAILY AS NEEDED 12/25/20   Parrett, Tammy S, NP  RESTASIS 0.05 % ophthalmic emulsion Place 1 drop into both eyes daily as needed.  12/08/18   [provider]  rosuvastatin (CRESTOR) 40 MG tablet TAKE 1 TABLET (40 MG TOTAL) BY MOUTH DAILY AT 6 PM. 10/13/20   Ladell Pier, MD  spironolactone (ALDACTONE) 25 MG tablet Take 1 tablet by mouth daily. 10/14/20   [provider]  Tiotropium Bromide-Olodaterol (STIOLTO RESPIMAT) 2.5-2.5 MCG/ACT AERS Inhale 2 puffs into the lungs daily. 07/24/20   Parrett, Fonnie Mu, NP  torsemide (DEMADEX) 100 MG tablet TAKE 1 TABLET (100 MG TOTAL) BY MOUTH 2 (TWO) TIMES DAILY. 01/26/21   Larey Dresser, MD  VICTOZA 18 MG/3ML SOPN INJECT 1.8 MG INTO THE SKIN DAILY. 09/18/20   Ladell Pier, MD  vitamin B-12 (CYANOCOBALAMIN) 1000 MCG tablet Take  1,000 mcg by mouth daily.    [provider]  Vitamin D, Ergocalciferol, (DRISDOL) 1.25  MG (50000 UNIT) CAPS capsule Take 50,000 Units by mouth once a week. 04/15/20   [provider]    Allergies    Metolazone, Plavix [clopidogrel bisulfate], Ibuprofen, and Nsaids  Review of Systems   Review of Systems  Constitutional:  Negative for chills and fever.  HENT:  Negative for congestion.   Eyes:  Negative for visual disturbance.  Respiratory:  Positive for cough. Negative for shortness of breath.        + Left lateral/lower rib pain  Cardiovascular:  Negative for chest pain and palpitations.  Gastrointestinal:  Negative for abdominal pain, diarrhea and vomiting.  Genitourinary:  Negative for dysuria and flank pain.  Musculoskeletal:  Positive for back pain.  Skin:  Negative for rash.  Neurological:  Negative for headaches.   Physical Exam Updated Vital Signs BP 126/65   Pulse (!) 56   Temp 97.6 F (36.4 C)   Resp 18   Ht 5' 7.5" (1.715 m)   Wt (!) 158.8 kg   LMP 07/28/2013 Comment: perimenopausal  SpO2 100%   BMI 54.01 kg/m   Physical Exam Vitals and nursing note reviewed.  Constitutional:      Appearance: Normal appearance. She is obese.  HENT:     Head: Normocephalic.     Mouth/Throat:     Mouth: Mucous membranes are moist.  Cardiovascular:     Rate and Rhythm: Normal rate.  Pulmonary:     Effort: Pulmonary effort is normal. No respiratory distress.     Breath sounds: No wheezing or rales.  Abdominal:     Palpations: Abdomen is soft.     Tenderness: There is no abdominal tenderness.  Musculoskeletal:     Comments: Tenderness to palpation of the left lateral/lower ribs, no overlying rash  Skin:    General: Skin is warm.  Neurological:     Mental Status: She is alert and oriented to person, place, and time. Mental status is at baseline.  Psychiatric:        Mood and Affect: Mood normal.    ED Results / Procedures / Treatments   Labs (all  labs ordered are listed, but only abnormal results are displayed) Labs Reviewed - No data to display  EKG None  Radiology No results found.  Procedures Procedures   Medications Ordered in ED Medications - No data to display  ED Course  I have reviewed the triage vital signs and the nursing notes.  Pertinent labs & imaging results that were available during my care of the patient were reviewed by me and considered in my medical decision making (see chart for details).    MDM Rules/Calculators/A&P                           60 year old female presents emergency department with left-sided rib pain stemming from coughing.  Worse with movement, reproducible on exam, no overlying rash.  Chest x-ray shows no rib fracture, lungs are clear and baseline for the patient.  No other chest pain/back pain.  Low suspicion for ACS/vascular pathology.  Low suspicion for PE given the sudden onset with coughing.  Believe this is musculoskeletal.  Patient will be treated symptomatically for outpatient follow-up.  Patient at this time appears safe and stable for discharge and will be treated as an outpatient.  Discharge plan and strict return to ED precautions discussed, patient verbalizes understanding and agreement.  Final Clinical Impression(s) / ED Diagnoses Final diagnoses:  None  Rx / DC Orders ED Discharge Orders     None        Lorelle Gibbs, DO 02/09/21 1501

## 2021-02-09 NOTE — ED Triage Notes (Signed)
Pt to ER via EMS from home with c/o left sided pain that started 3 weeks ago.  Pt reports pain is worse on coughing, raising arm and other movements.  Pt has CHF and has chronic cough.  Pt was seen by PCP and told she had a pulled muscle.

## 2021-02-11 ENCOUNTER — Other Ambulatory Visit: Payer: Self-pay | Admitting: Adult Health

## 2021-02-16 ENCOUNTER — Other Ambulatory Visit: Payer: Self-pay | Admitting: Internal Medicine

## 2021-02-16 DIAGNOSIS — I25119 Atherosclerotic heart disease of native coronary artery with unspecified angina pectoris: Secondary | ICD-10-CM

## 2021-02-16 NOTE — Telephone Encounter (Signed)
Requested medications are due for refill today record shows filled for 90 day supply 01/12/21  Requested medications are on the active medication list yes  Last refill 01/12/21 for 90 days  Last visit 12/2019, last lab was 09/2019  Future visit scheduled no  Notes to clinic Requesting early, failed protocol due to lab more than 360 days ago, no upcoming visit scheduled, requesting early.

## 2021-02-17 ENCOUNTER — Ambulatory Visit: Payer: Medicaid Other | Admitting: Endocrinology

## 2021-02-17 ENCOUNTER — Other Ambulatory Visit: Payer: Self-pay

## 2021-02-17 ENCOUNTER — Encounter: Payer: Self-pay | Admitting: Endocrinology

## 2021-02-17 VITALS — BP 122/70 | HR 66 | Ht 68.0 in | Wt 341.2 lb

## 2021-02-17 DIAGNOSIS — N184 Chronic kidney disease, stage 4 (severe): Secondary | ICD-10-CM

## 2021-02-17 DIAGNOSIS — E1165 Type 2 diabetes mellitus with hyperglycemia: Secondary | ICD-10-CM | POA: Diagnosis not present

## 2021-02-17 DIAGNOSIS — I25119 Atherosclerotic heart disease of native coronary artery with unspecified angina pectoris: Secondary | ICD-10-CM | POA: Diagnosis not present

## 2021-02-17 DIAGNOSIS — Z794 Long term (current) use of insulin: Secondary | ICD-10-CM

## 2021-02-17 DIAGNOSIS — E1142 Type 2 diabetes mellitus with diabetic polyneuropathy: Secondary | ICD-10-CM

## 2021-02-17 DIAGNOSIS — IMO0002 Reserved for concepts with insufficient information to code with codable children: Secondary | ICD-10-CM

## 2021-02-17 LAB — LIPID PANEL
Cholesterol: 147 mg/dL (ref 0–200)
HDL: 52.9 mg/dL (ref >39.00)
LDL Cholesterol: 66 mg/dL (ref 0–99)
NonHDL: 93.69
Total CHOL/HDL Ratio: 3
Triglycerides: 136 mg/dL (ref 0.0–149.0)
VLDL: 27.2 mg/dL (ref 0.0–40.0)

## 2021-02-17 LAB — POCT GLYCOSYLATED HEMOGLOBIN (HGB A1C): Hemoglobin A1C: 9.7 % — AB (ref 4.0–5.6)

## 2021-02-17 LAB — BASIC METABOLIC PANEL
BUN: 56 mg/dL — ABNORMAL HIGH (ref 6–23)
CO2: 33 mEq/L — ABNORMAL HIGH (ref 19–32)
Calcium: 9.8 mg/dL (ref 8.4–10.5)
Chloride: 100 mEq/L (ref 96–112)
Creatinine, Ser: 1.92 mg/dL — ABNORMAL HIGH (ref 0.40–1.20)
GFR: 27.84 mL/min — ABNORMAL LOW (ref >60.00)
Glucose, Bld: 147 mg/dL — ABNORMAL HIGH (ref 70–99)
Potassium: 4 mEq/L (ref 3.5–5.1)
Sodium: 141 mEq/L (ref 135–145)

## 2021-02-17 LAB — POCT GLUCOSE (DEVICE FOR HOME USE): Glucose Fasting, POC: 186 mg/dL — AB (ref 70–99)

## 2021-02-17 LAB — MICROALBUMIN / CREATININE URINE RATIO
Creatinine,U: 56.2 mg/dL
Microalb Creat Ratio: 7.7 mg/g (ref 0.0–30.0)
Microalb, Ur: 4.3 mg/dL — ABNORMAL HIGH (ref 0.0–1.9)

## 2021-02-17 LAB — TSH: TSH: 4.26 u[IU]/mL (ref 0.35–5.50)

## 2021-02-17 MED ORDER — ROSUVASTATIN CALCIUM 40 MG PO TABS: 40.0000 mg | ORAL_TABLET | Freq: Every day | ORAL | 0 refills | Status: DC

## 2021-02-17 MED ORDER — FREESTYLE LIBRE 2 READER DEVI: 1.0000 | Freq: Once | 0 refills | Status: AC

## 2021-02-17 MED ORDER — EASY COMFORT PEN NEEDLES 31G X 5 MM MISC: 12 refills | Status: DC

## 2021-02-17 MED ORDER — FREESTYLE LIBRE 2 SENSOR MISC: 2.0000 | 3 refills | Status: DC

## 2021-02-17 MED ORDER — NOVOLOG FLEXPEN 100 UNIT/ML ~~LOC~~ SOPN: PEN_INJECTOR | SUBCUTANEOUS | 4 refills | Status: DC

## 2021-02-17 NOTE — Patient Instructions (Addendum)
Take 24 Novolog in am  Start OZEMPIC injections by dialing 0.25 mg on the pen as shown once weekly on the same day of the week.   You may inject in the sides of the stomach, outer thigh or arm as indicated in the brochure given. If you have any difficulties using the pen see the video at CompPlans.[email or web address].za  You will feel fullness of the stomach with starting the medication and should try to keep the portions at meals small.  You may experience nausea in the first few days which usually gets better over time    After 2 weeks increase the dose to 0.5 mg weekly  If you have any questions or persistent side effects please call the office   Check blood sugars on waking up 7 days a week  Also check blood sugars about 2 hours after meals and do this after different meals by rotation  Recommended blood sugar levels on waking up are 90-130 and about 2 hours after meal is 130-160  Please bring your blood sugar monitor to each visit, thank you

## 2021-02-17 NOTE — Progress Notes (Signed)
Patient ID: Tamara Silva, female   DOB: [birth year], 60 y.o.   MRN: [id number]          Reason for Appointment: Follow-up visit for Type 2 Diabetes  Referring PCP: Dr. Karle Plumber   History of Present Illness:          Date of diagnosis of type 2 diabetes mellitus: 2000       Background history:  She thinks her blood sugar was over 600 at the time of diagnosis and was started on insulin from the start Also she took Metformin for couple of years but she thinks it was stopped because it did not work Her diabetes history is only available since about 2016 in her chart She has been mostly on Levemir insulin Appears to have had sporadic follow-up with only 4 or 5 A1c results available in the last 4 years  Recent history:   Most recent A1c is 9.7 done on 02/17/2021 Reportedly previously was 15  INSULIN regimen is: [place] 80 units daily, NovoLog 20 units before breakfast and lunch and 24 before supper  Non-insulin hypoglycemic drugs the patient is taking are: Victoza 1.8 mg daily  Current management, blood sugar patterns and problems identified: She has not been seen in follow-up for over a year  She did not bring her meter for download today and difficult to know what her blood sugar patterns are  Also she does not appear to be checking blood sugars after evening meal  He has been referred today dietitian but appointments have not been made, she was also advised to cut back on high fat foods and cereal Not clear why her blood sugars are higher than last year as she did not appear to have any consistently high patterns However she thinks her sugars go up when she gets prednisone periodically for her COPD She does think that she is quite regular with her insulin and Victoza as above and not missing any doses Today blood sugar is 186 fasting Occasionally she will have a snack in the night causing higher readings in the mornings She still has difficulty losing weight No hypoglycemia   Not able to do any exercise, she comes in a wheelchair today        Side effects from medications have been: None     Meal times are: Dinner: 6-7 PM              Glucose monitoring:  done about 2 times a day         Glucometer:  Accu-Chek.       Blood Glucose readings by recall:   PRE-MEAL Fasting Lunch Dinner Bedtime Overall  Glucose range: 102-200 180 145 ?   Mean/median:        Prior  PRE-MEAL Fasting Lunch Dinner Bedtime Overall  Glucose range: 99-295  249  131-230  181, 183   Mean/median:  206     180   POST-MEAL PC Breakfast PC Lunch PC Dinner  Glucose range:   104-119   Mean/median:        Dietician visit, most recent: During last hospitalization  Weight history:  Wt Readings from Last 3 Encounters:  02/17/21 (!) 341 lb 3.2 oz (154.8 kg)  02/09/21 (!) 350 lb (158.8 kg)  01/26/21 (!) 350 lb 3.2 oz (158.8 kg)    Glycemic control:   Lab Results  Component Value Date   HGBA1C 9.7 (A) 02/17/2021   HGBA1C 7.4 (A) 10/22/2019   HGBA1C 8.8 (A) 01/19/2019  Lab Results  Component Value Date   MICROALBUR 9.4 (H) 05/08/2019   LDLCALC 68 10/22/2019   CREATININE 2.40 (H) 02/08/2021   Lab Results  Component Value Date   MICRALBCREAT 7.1 05/08/2019    Lab Results  Component Value Date   FRUCTOSAMINE 261 05/08/2019    Office Visit on 02/17/2021  Component Date Value Ref Range Status   Hemoglobin A1C 02/17/2021 9.7 (A) 4.0 - 5.6 % Final   Glucose Fasting, POC 02/17/2021 186 (A) 70 - 99 mg/dL Final    Allergies as of 02/17/2021       Reactions   Metolazone Other (See Comments)   Dizziness and falling Per patient in 2021, states she does not think she fell because of this medication.   Plavix [clopidogrel Bisulfate] Other (See Comments)   High PRU's - non-responder   Ibuprofen Other (See Comments)   Pt states she is not supposed to take ibuprofen because of other meds she is taking   Nsaids Other (See Comments)   "I do not take NSAIDs d/t  interfering with other meds"        Medication List        Accurate as of February 17, 2021 10:31 AM. If you have any questions, ask your nurse or doctor.          Accu-Chek Aviva Plus test strip Generic drug: glucose blood USE 1 STRIP TO CHECK BLOOD SUGAR 3 TIMES A DAY .   Accu-Chek Guide w/Device Kit Use accu chek guide to check blood sugar three times daily.   acetaminophen 500 MG tablet Commonly known as: TYLENOL Take 500 mg by mouth every 6 (six) hours as needed for moderate pain or headache.   Anoro Ellipta 62.5-25 MCG/INH Aepb Generic drug: umeclidinium-vilanterol INHALE 1 PUFF INTO THE LUNGS DAILY.   ascorbic acid 500 MG tablet Commonly known as: VITAMIN C TAKE ONE TABLET BY MOUTH ONCE DAILY   Aspirin Low Dose 81 MG EC tablet Generic drug: aspirin CHEW 1 TABLET (81 MG TOTAL) BY MOUTH DAILY.   BiDil 20-37.5 MG tablet Generic drug: isosorbide-hydrALAZINE TAKE 1 TABLET BY MOUTH 3 (THREE) TIMES DAILY.   bisoprolol 5 MG tablet Commonly known as: ZEBETA TAKE 1 TABLET (5 MG TOTAL) BY MOUTH DAILY.   Easy Comfort Pen Needles 31G X 5 MM Misc Generic drug: Insulin Pen Needle USE 3 TIMES A DAY FOR INSULIN ADMINISTRATION   Entresto 24-26 MG Generic drug: sacubitril-valsartan TAKE 1 TABLET BY MOUTH 2 (TWO) TIMES DAILY. APPT NEEDED FOR FURTHER REFILLS. PLEASE CALL [phone]   FeroSul 325 (65 FE) MG tablet Generic drug: ferrous sulfate TAKE 1 TABLET (325 MG TOTAL) BY MOUTH DAILY AFTER SUPPER. PLEASE TAKE WITH A SOURCE OF VITAMIN C   fluticasone 50 MCG/ACT nasal spray Commonly known as: FLONASE PLACE 1 SPRAY INTO BOTH NOSTRILS DAILY.   hydrocerin Crea Apply 1 application topically 2 (two) times daily.   ipratropium-albuterol 0.5-2.5 (3) MG/3ML Soln Commonly known as: DUONEB INHALE CONTENTS OF ONE VIAL USING NEBULIZER EVERY 6 HOURS AS NEEDED FOR SHORTNESS OF BREATH, WHEEZING   metolazone 2.5 MG tablet Commonly known as: ZAROXOLYN Take 1 tablet (2.5 mg  total) by mouth once a week. APPT NEEDED FOR FURTHER REFILLS. PLEASE CALL [phone]   NEEDLE (DISP) 26 G 26G X 1/2" Misc 1 application by Does not apply route daily.   NovoLOG FlexPen 100 UNIT/ML FlexPen Generic drug: insulin aspart Inject 20 units under the skin at breakfast and lunch, and 24 units at dinner.  omeprazole 40 MG capsule Commonly known as: PRILOSEC TAKE 1 CAPSULE (40 MG TOTAL) BY MOUTH DAILY.   potassium chloride SA 20 MEQ tablet Commonly known as: KLOR-CON TAKE 2 TABLETS (40 MEQ TOTAL) BY MOUTH DAILY.   predniSONE 10 MG tablet Commonly known as: DELTASONE Take 1 pill daily   ProAir HFA 108 (90 Base) MCG/ACT inhaler Generic drug: albuterol INHALE 2 PUFFS BY MOUTH 4 TIMES DAILY AS NEEDED   Restasis 0.05 % ophthalmic emulsion Generic drug: cycloSPORINE Place 1 drop into both eyes daily as needed.   rosuvastatin 40 MG tablet Commonly known as: CRESTOR Take 1 tablet (40 mg total) by mouth daily at 6 PM.   spironolactone 25 MG tablet Commonly known as: ALDACTONE Take 1 tablet by mouth daily.   Stiolto Respimat 2.5-2.5 MCG/ACT Aers Generic drug: Tiotropium Bromide-Olodaterol Inhale 2 puffs into the lungs daily.   torsemide 100 MG tablet Commonly known as: DEMADEX TAKE 1 TABLET (100 MG TOTAL) BY MOUTH 2 (TWO) TIMES DAILY.   traMADol 50 MG tablet Commonly known as: ULTRAM SMARTSIG:1 Tablet(s) By Mouth Every 12 Hours PRN   Tresiba FlexTouch 200 UNIT/ML FlexTouch Pen Generic drug: insulin degludec INJECT 80 UNITS UNDER THE SKIN ONCE DAILY  LAST FOR 45 DAYS   Victoza 18 MG/3ML Sopn Generic drug: liraglutide INJECT 1.8 MG INTO THE SKIN DAILY.   vitamin B-12 1000 MCG tablet Commonly known as: CYANOCOBALAMIN Take 1,000 mcg by mouth daily.   Vitamin D (Ergocalciferol) 1.25 MG (50000 UNIT) Caps capsule Commonly known as: DRISDOL Take 50,000 Units by mouth once a week.        Allergies:  Allergies  Allergen Reactions   Metolazone Other (See  Comments)    Dizziness and falling Per patient in 2021, states she does not think she fell because of this medication.   Plavix [Clopidogrel Bisulfate] Other (See Comments)    High PRU's - non-responder   Ibuprofen Other (See Comments)    Pt states she is not supposed to take ibuprofen because of other meds she is taking   Nsaids Other (See Comments)    "I do not take NSAIDs d/t interfering with other meds"    Past Medical History:  Diagnosis Date   AICD (automatic cardioverter/defibrillator) present 04/04/2019   Asthma    CAD (coronary artery disease)    Chronic combined systolic (congestive) and diastolic (congestive) heart failure (HCC)    Chronic iron deficiency anemia    CVA (cerebral vascular accident) ([place]) 05/2018   Diabetes mellitus without complication ([place])    DOE (dyspnea on exertion)    Edema of both legs    Hepatic steatosis    Hypertension    Pulmonary emboli ([place]) 2014   Respiratory failure ([place]) 09/2018   Seizure ([place]) 05/2018    Past Surgical History:  Procedure Laterality Date   BIOPSY  07/10/2018   Procedure: BIOPSY;  Surgeon: [name], MD;  Location: [facility];  Service: Gastroenterology;;   COLONOSCOPY WITH PROPOFOL N/A 08/03/2013   Procedure: COLONOSCOPY WITH PROPOFOL;  Surgeon: Jeryl Columbia, MD;  Location: [facility];  Service: Endoscopy;  Laterality: N/A;   COLONOSCOPY WITH PROPOFOL N/A 07/10/2018   Procedure: COLONOSCOPY WITH PROPOFOL;  Surgeon: [name], MD;  Location: [facility];  Service: Gastroenterology;  Laterality: N/A;   CORONARY ANGIOPLASTY WITH STENT PLACEMENT     CAD in 2006 x 2 and 2009 2 more- place din REx in [place] and [place] [facility]   CORONARY ANGIOPLASTY WITH STENT PLACEMENT  10/07/2013  Xience [place] DES 2.75  mm x 15  mm   ESOPHAGOGASTRODUODENOSCOPY (EGD) WITH PROPOFOL N/A 08/03/2013   Procedure: ESOPHAGOGASTRODUODENOSCOPY (EGD) WITH PROPOFOL;  Surgeon: Jeryl Columbia, MD;  Location: [facility];  Service: Endoscopy;   Laterality: N/A;   ESOPHAGOGASTRODUODENOSCOPY (EGD) WITH PROPOFOL N/A 07/10/2018   Procedure: ESOPHAGOGASTRODUODENOSCOPY (EGD) WITH PROPOFOL;  Surgeon: [name], MD;  Location: [facility];  Service: Gastroenterology;  Laterality: N/A;   ICD IMPLANT  04/04/2019   ICD IMPLANT N/A 04/04/2019   Procedure: ICD IMPLANT;  Surgeon: Constance Haw, MD;  Location: [facility];  Service: Cardiovascular;  Laterality: N/A;   LEFT HEART CATHETERIZATION WITH CORONARY ANGIOGRAM N/A 10/07/2013   Procedure: LEFT HEART CATHETERIZATION WITH CORONARY ANGIOGRAM;  Surgeon: Leonie Man, MD;  Location: [facility];  Service: Cardiovascular;  Laterality: N/A;   RIGHT HEART CATH N/A 11/06/2018   Procedure: RIGHT HEART CATH;  Surgeon: Larey Dresser, MD;  Location: [place] [facility];  Service: Cardiovascular;  Laterality: N/A;   RIGHT/LEFT HEART CATH AND CORONARY ANGIOGRAPHY N/A 02/12/2019   Procedure: RIGHT/LEFT HEART CATH AND CORONARY ANGIOGRAPHY;  Surgeon: Larey Dresser, MD;  Location: [place] [facility];  Service: Cardiovascular;  Laterality: N/A;    Family History  Problem Relation Age of Onset   Stroke Mother    Hypertension Mother    Colon cancer Father    Diabetes Father    Heart attack Father    Sarcoidosis Other    Breast cancer Paternal Aunt    Emphysema Brother    Lung disease Brother        Unknown type, 3 brothers, one with liver and lung disease   Diabetes Brother    Asthma Paternal Aunt    Diabetes Sister     Social History:  reports that she quit smoking about 2 years ago. Her smoking use included cigarettes. She has a 10.00 pack-year smoking history. She has never used smokeless tobacco. She reports that she does not currently use drugs after having used the following drugs: Cocaine. She reports that she does not drink alcohol.   Review of Systems  TSH was abnormal in 5/20 but subsequently normal   Lab Results  Component Value Date   TSH 3.46 10/22/2019   TSH  3.11 05/08/2019   TSH 5.177 (H) 10/28/2018   FREET4 0.83 05/08/2019     Lipid history: Has been treated with Crestor 40 mg daily, has history of CAD    Lab Results  Component Value Date   CHOL 150 10/22/2019   HDL 56.50 10/22/2019   LDLCALC 68 10/22/2019   TRIG 129.0 10/22/2019   CHOLHDL 3 10/22/2019           Hypertension: Treated with bisoprolol, also CHF has been present CHF treated with spironolactone and torsemide as well as isosorbide/hydralazine  BP Readings from Last 3 Encounters:  02/17/21 122/70  02/09/21 (!) 111/58  02/08/21 132/60   She has chronic renal insufficiency not related to diabetes, last visit with nephrologist on 3/22   Lab Results  Component Value Date   CREATININE 2.40 (H) 02/08/2021   CREATININE 2.52 (H) 12/25/2019   CREATININE 2.34 (H) 12/20/2019     Most recent foot exam: 5/21  Currently known complications of diabetes: Neuropathy, microalbuminuria Neuropathic symptoms mostly controlled with gabapentin  Other active problems are history of coronary artery disease, cardiomyopathy and COPD She is using oxygen  LABS:  Office Visit on 02/17/2021  Component Date Value Ref Range Status  Hemoglobin A1C 02/17/2021 9.7 (A) 4.0 - 5.6 % Final   Glucose Fasting, POC 02/17/2021 186 (A) 70 - 99 mg/dL Final    Physical Examination:     BP 122/70   Pulse 66   Ht _0  (1.727 m)   Wt (!) 341 lb 3.2 oz (154.8 kg)   LMP 07/28/2013 Comment: perimenopausal  SpO2 92%   BMI 51.88 kg/m     ASSESSMENT:  Diabetes type 2 on insulin  See history of present illness for detailed discussion of current diabetes management, blood sugar patterns and problems identified  A1c is significantly higher this year and now 9.7 She is on [place] and mealtime NovoLog with 1.8 mg daily Victoza  Although her fasting readings are probably better with increasing her Tyler Aas last year not clear why A1c is higher since she has had only occasional prednisone   She also is likely not checking blood sugars much and usually not after meals  Difficult to assess her blood sugar patterns She can also benefit from continuous glucose monitoring  Also can do more readings after meals especially at night Needs basic diabetes education  Neuropathy: Symptoms controlled Has only minimal objective findings on sensory exam  Hypertension: Controlled  History of CAD and hyperlipidemia: Will be due for follow-up lipids, not done since 9/20   PLAN:    Glucose monitoring: Start using freestyle libre, discussed how this works and how it will benefit her, likely needs training on doing this and will need reader also  2.  Diabetes education: Patient will need consultation with diabetes educator especially for freestyle libre if she is able to get this but also on basic insulin information and diet  3.  Needs low-fat diet, also needs to cut back on snacks during the night  4.  Medication changes needed: Trial of Ozempic instead of Victoza as this can be titrated to higher doses, likely Mounjaro is not available at this time Explained to the patient is on GLP-1 drugs in general with benefits of reduction of hunger sensation and blood sugar control postprandially.  Also discussed the benefit of weight loss   Explained possible side effects of OZEMPIC, most commonly nausea that usually improves over time.   Demonstrated the medication injection device and weekly routine To start the injections with the 0.25 mg dosage weekly for the first 2 injections and then go up to 0.5 mg weekly if no nausea Likely will go up to 1 mg on her next visit She needs to bring her blood sugar records with her for review on each visit  5.  Preventive care needed:  Urine microalbumin, follow-up eye exam  6.  Follow-up: 6 weeks to reassess level of control and insulin adjustment  7.  Recheck lipids   Patient Instructions  Take 24 Novolog in am  Start OZEMPIC injections by  dialing 0.25 mg on the pen as shown once weekly on the same day of the week.   You may inject in the sides of the stomach, outer thigh or arm as indicated in the brochure given. If you have any difficulties using the pen see the video at [email or web address]  You will feel fullness of the stomach with starting the medication and should try to keep the portions at meals small.  You may experience nausea in the first few days which usually gets better over time    After 2 weeks increase the dose to 0.5 mg weekly  If you have any questions or persistent side  effects please call the office   Check blood sugars on waking up 7 days a week  Also check blood sugars about 2 hours after meals and do this after different meals by rotation  Recommended blood sugar levels on waking up are 90-130 and about 2 hours after meal is 130-160  Please bring your blood sugar monitor to each visit, thank you             Elayne Snare 02/17/2021, 10:31 AM   Note: This office note was prepared with Dragon voice recognition system technology. Any transcriptional errors that result from this process are unintentional.

## 2021-02-23 ENCOUNTER — Other Ambulatory Visit: Payer: Self-pay | Admitting: Internal Medicine

## 2021-02-23 DIAGNOSIS — K219 Gastro-esophageal reflux disease without esophagitis: Secondary | ICD-10-CM

## 2021-02-23 NOTE — Telephone Encounter (Signed)
Requested medications are due for refill today.  yes  Requested medications are on the active medications list.  yes  Last refill. 06/26/2020  Future visit scheduled.   no  Notes to clinic.  Pt not seen for over 1 year. PCP listed is Darrol Jump.

## 2021-03-12 ENCOUNTER — Other Ambulatory Visit: Payer: Self-pay | Admitting: Endocrinology

## 2021-03-12 ENCOUNTER — Other Ambulatory Visit: Payer: Self-pay | Admitting: Hematology and Oncology

## 2021-03-13 ENCOUNTER — Telehealth: Payer: Self-pay | Admitting: Pharmacy Technician

## 2021-03-13 ENCOUNTER — Other Ambulatory Visit (HOSPITAL_COMMUNITY): Payer: Self-pay

## 2021-03-13 NOTE — Telephone Encounter (Signed)
Received notification from [place] regarding a prior authorization for [place] 200U/mL. Authorization has been APPROVED from 10.14.22 to 10.31.22.   Per test claim, copay for 30 days supply is $4  Authorization # [id number]  Pt's current plan will only will only authorize this medication until 10.31.22 because she will transition to [place] effective 11.1.22. New PA will need to initiated for this medication at this time. Managed Care [facility] [phone].

## 2021-03-20 ENCOUNTER — Other Ambulatory Visit: Payer: Self-pay | Admitting: Endocrinology

## 2021-03-23 ENCOUNTER — Other Ambulatory Visit (HOSPITAL_COMMUNITY): Payer: Self-pay | Admitting: Cardiology

## 2021-03-23 ENCOUNTER — Other Ambulatory Visit: Payer: Self-pay | Admitting: Endocrinology

## 2021-03-23 DIAGNOSIS — I25119 Atherosclerotic heart disease of native coronary artery with unspecified angina pectoris: Secondary | ICD-10-CM

## 2021-03-25 ENCOUNTER — Other Ambulatory Visit: Payer: Self-pay

## 2021-03-25 ENCOUNTER — Ambulatory Visit (INDEPENDENT_AMBULATORY_CARE_PROVIDER_SITE_OTHER): Payer: Medicaid Other | Admitting: Endocrinology

## 2021-03-25 VITALS — BP 118/60 | HR 62 | Ht 67.5 in | Wt 354.0 lb

## 2021-03-25 DIAGNOSIS — N184 Chronic kidney disease, stage 4 (severe): Secondary | ICD-10-CM

## 2021-03-25 DIAGNOSIS — Z794 Long term (current) use of insulin: Secondary | ICD-10-CM

## 2021-03-25 DIAGNOSIS — Z23 Encounter for immunization: Secondary | ICD-10-CM

## 2021-03-25 DIAGNOSIS — I5022 Chronic systolic (congestive) heart failure: Secondary | ICD-10-CM

## 2021-03-25 DIAGNOSIS — E1165 Type 2 diabetes mellitus with hyperglycemia: Secondary | ICD-10-CM

## 2021-03-25 MED ORDER — FREESTYLE LIBRE 2 READER DEVI: 1.0000 | Freq: Once | 0 refills | Status: AC

## 2021-03-25 MED ORDER — OZEMPIC (1 MG/DOSE) 2 MG/1.5ML ~~LOC~~ SOPN: 1.0000 mg | PEN_INJECTOR | SUBCUTANEOUS | 2 refills | Status: DC

## 2021-03-25 MED ORDER — FREESTYLE LIBRE 2 SENSOR MISC: 2.0000 | 3 refills | Status: DC

## 2021-03-25 NOTE — Patient Instructions (Addendum)
Take 1mg  Ozempic weekly  Until sugar is < 150 go up to 94 units TRESIBA  Novolog for now 30 units with each meal  Avoid kool aid

## 2021-03-25 NOTE — Progress Notes (Signed)
Patient ID: Tamara Silva, female   DOB: [birth year], 60 y.o.   MRN: [id number]          Reason for Appointment: Follow-up visit for Type 2 Diabetes  Referring PCP: Dr. Karle Plumber   History of Present Illness:          Date of diagnosis of type 2 diabetes mellitus: 2000       Background history:  She thinks her blood sugar was over 600 at the time of diagnosis and was started on insulin from the start Also she took Metformin for couple of years but she thinks it was stopped because it did not work Her diabetes history is only available since about 2016 in her chart She has been mostly on Levemir insulin Appears to have had sporadic follow-up with only 4 or 5 A1c results available in the last 4 years  Recent history:   Most recent A1c is 9.7 done on 02/17/2021 Reportedly previously was 15  INSULIN regimen is: [place] 80 units daily, NovoLog 20 units before breakfast and lunch and 24 before supper  Non-insulin hypoglycemic drugs the patient is taking are: See below  Current management, blood sugar patterns and problems identified: Blood sugar readings are analyzed below She did bring her meter for download today but the date and time are not appearing to be accurate Most of her blood sugars are running high especially recently  She was supposed to start [facility] which she did with a sample but likely misunderstood her instructions and has used up her pen even though she was given this a month ago  She may have used 2 injections in the same week and probably taking only 0.25 until possibly last week but she does not remember She did have a few good readings likely when she was taking the higher dose of Ozempic but most of her sugars appear to be over 200  Not clear if she has previously been on prescribed the freestyle libre She is not consistent with her diet and drinking regular drinks like Kool-Aid She still has difficulty losing weight and weight may be slightly higher No  hypoglycemia  Not able to do any exercise, she comes in a wheelchair          Side effects from medications have been: None     Meal times are: Dinner: 6-7 PM              Glucose monitoring:  done about 2 times a day         Glucometer:  Accu-Chek.       Blood Glucose readings by Accu-Chek download:  Over the last month blood sugar patterns are as follows Blood sugars are being monitored mostly late at night although time on the monitor may not be accurate HIGHEST blood sugars overall are either in the mornings or early evenings She has significant variability and will need some good readings below 180 late at night/mornings  PRE-MEAL Fasting Lunch Dinner hs Overall  Glucose range:     80-386  Mean/median: 233 182 220 216 216   Prior  PRE-MEAL Fasting Lunch Dinner Bedtime Overall  Glucose range: 102-200 180 145 ?   Mean/median:            Dietician visit, most recent: During last hospitalization  Weight history:  Wt Readings from Last 3 Encounters:  03/25/21 (!) 354 lb (160.6 kg)  02/17/21 (!) 341 lb 3.2 oz (154.8 kg)  02/09/21 (!) 350 lb (158.8 kg)  Glycemic control:   Lab Results  Component Value Date   HGBA1C 9.7 (A) 02/17/2021   HGBA1C 7.4 (A) 10/22/2019   HGBA1C 8.8 (A) 01/19/2019   Lab Results  Component Value Date   MICROALBUR 4.3 (H) 02/17/2021   LDLCALC 66 02/17/2021   CREATININE 1.92 (H) 02/17/2021   Lab Results  Component Value Date   MICRALBCREAT 7.7 02/17/2021    Lab Results  Component Value Date   FRUCTOSAMINE 261 05/08/2019    No visits with results within 1 Week(s) from this visit.  Latest known visit with results is:  Office Visit on 02/17/2021  Component Date Value Ref Range Status   Hemoglobin A1C 02/17/2021 9.7 (A)  4.0 - 5.6 % Final   Glucose Fasting, POC 02/17/2021 186 (A)  70 - 99 mg/dL Final   TSH 02/17/2021 4.26  0.35 - 5.50 uIU/mL Final   Cholesterol 02/17/2021 147  0 - 200 mg/dL Final   ATP III Classification        Desirable:  < 200 mg/dL               Borderline High:  200 - 239 mg/dL          High:  > = 240 mg/dL   Triglycerides 02/17/2021 136.0  0.0 - 149.0 mg/dL Final   Normal:  <150 mg/dLBorderline High:  150 - 199 mg/dL   HDL 02/17/2021 52.90  >39.00 mg/dL Final   VLDL 02/17/2021 27.2  0.0 - 40.0 mg/dL Final   LDL Cholesterol 02/17/2021 66  0 - 99 mg/dL Final   Total CHOL/HDL Ratio 02/17/2021 3   Final                  Men          Women1/2 Average Risk     3.4          3.3Average Risk          5.0          4.42X Average Risk          9.6          7.13X Average Risk          15.0          11.0                       NonHDL 02/17/2021 93.69   Final   NOTE:  Non-HDL goal should be 30 mg/dL higher than patient's LDL goal (i.e. LDL goal of < 70 mg/dL, would have non-HDL goal of < 100 mg/dL)   Sodium 02/17/2021 141  135 - 145 mEq/L Final   Potassium 02/17/2021 4.0  3.5 - 5.1 mEq/L Final   Chloride 02/17/2021 100  96 - 112 mEq/L Final   CO2 02/17/2021 33 (A)  19 - 32 mEq/L Final   Glucose, Bld 02/17/2021 147 (A)  70 - 99 mg/dL Final   BUN 02/17/2021 56 (A)  6 - 23 mg/dL Final   Creatinine, Ser 02/17/2021 1.92 (A)  0.40 - 1.20 mg/dL Final   GFR 02/17/2021 27.84 (A)  >60.00 mL/min Final   Calculated using the CKD-EPI Creatinine Equation (2021)   Calcium 02/17/2021 9.8  8.4 - 10.5 mg/dL Final   Microalb, Ur 02/17/2021 4.3 (A)  0.0 - 1.9 mg/dL Final   Creatinine,U 02/17/2021 56.2  mg/dL Final   Microalb Creat Ratio 02/17/2021 7.7  0.0 - 30.0 mg/g Final    Allergies as of  03/25/2021       Reactions   Metolazone Other (See Comments)   Dizziness and falling Per patient in 2021, states she does not think she fell because of this medication.   Plavix [clopidogrel Bisulfate] Other (See Comments)   High PRU's - non-responder   Ibuprofen Other (See Comments)   Pt states she is not supposed to take ibuprofen because of other meds she is taking   Nsaids Other (See Comments)   "I do not take NSAIDs d/t  interfering with other meds"        Medication List        Accurate as of March 25, 2021  4:34 PM. If you have any questions, ask your nurse or doctor.          STOP taking these medications    predniSONE 10 MG tablet Commonly known as: DELTASONE Stopped by: Elayne Snare, MD   Victoza 18 MG/3ML Sopn Generic drug: liraglutide Stopped by: Elayne Snare, MD       TAKE these medications    Accu-Chek Aviva Plus test strip Generic drug: glucose blood USE 1 STRIP TO CHECK BLOOD SUGAR 3 TIMES A DAY .   Accu-Chek Guide w/Device Kit Use accu chek guide to check blood sugar three times daily.   acetaminophen 500 MG tablet Commonly known as: TYLENOL Take 500 mg by mouth every 6 (six) hours as needed for moderate pain or headache.   Anoro Ellipta 62.5-25 MCG/ACT Aepb Generic drug: umeclidinium-vilanterol INHALE 1 PUFF INTO THE LUNGS DAILY.   ascorbic acid 500 MG tablet Commonly known as: VITAMIN C TAKE ONE TABLET BY MOUTH ONCE DAILY   Aspirin Low Dose 81 MG EC tablet Generic drug: aspirin CHEW 1 TABLET (81 MG TOTAL) BY MOUTH DAILY.   BiDil 20-37.5 MG tablet Generic drug: isosorbide-hydrALAZINE TAKE 1 TABLET BY MOUTH 3 (THREE) TIMES DAILY.   bisoprolol 5 MG tablet Commonly known as: ZEBETA TAKE 1 TABLET (5 MG TOTAL) BY MOUTH DAILY.   Easy Comfort Pen Needles 31G X 5 MM Misc Generic drug: Insulin Pen Needle USE 3 TIMES A DAY FOR INSULIN ADMINISTRATION   Entresto 24-26 MG Generic drug: sacubitril-valsartan Take 1 tablet by mouth 2 (two) times daily. Needs appt for further refills   FeroSul 325 (65 FE) MG tablet Generic drug: ferrous sulfate TAKE 1 TABLET (325 MG TOTAL) BY MOUTH DAILY AFTER SUPPER. PLEASE TAKE WITH A SOURCE OF VITAMIN C   fluticasone 50 MCG/ACT nasal spray Commonly known as: FLONASE PLACE 1 SPRAY INTO BOTH NOSTRILS DAILY.   FreeStyle Libre 2 Sensor Misc 2 Devices by Does not apply route every 14 (fourteen) days.   hydrocerin Crea Apply 1  application topically 2 (two) times daily.   ipratropium-albuterol 0.5-2.5 (3) MG/3ML Soln Commonly known as: DUONEB INHALE CONTENTS OF ONE VIAL USING NEBULIZER EVERY 6 HOURS AS NEEDED FOR SHORTNESS OF BREATH, WHEEZING   metolazone 2.5 MG tablet Commonly known as: ZAROXOLYN Take 1 tablet (2.5 mg total) by mouth once a week. APPT NEEDED FOR FURTHER REFILLS. PLEASE CALL [phone]   NEEDLE (DISP) 26 G 26G X 1/2" Misc 1 application by Does not apply route daily.   NovoLOG FlexPen 100 UNIT/ML FlexPen Generic drug: insulin aspart Inject 20 units under the skin at breakfast and lunch, and 24 units at dinner.   omeprazole 40 MG capsule Commonly known as: PRILOSEC TAKE 1 CAPSULE (40 MG TOTAL) BY MOUTH DAILY.   Ozempic (1 MG/DOSE) 2 MG/1.5ML Sopn Generic drug: Semaglutide (1 MG/DOSE) Inject 1  mg into the skin once a week. Started by: Elayne Snare, MD   potassium chloride SA 20 MEQ tablet Commonly known as: KLOR-CON TAKE 2 TABLETS (40 MEQ TOTAL) BY MOUTH DAILY.   ProAir HFA 108 (90 Base) MCG/ACT inhaler Generic drug: albuterol INHALE 2 PUFFS BY MOUTH 4 TIMES DAILY AS NEEDED   Restasis 0.05 % ophthalmic emulsion Generic drug: cycloSPORINE Place 1 drop into both eyes daily as needed.   rosuvastatin 40 MG tablet Commonly known as: CRESTOR Take 1 tablet (40 mg total) by mouth daily at 6 PM.   spironolactone 25 MG tablet Commonly known as: ALDACTONE Take 1 tablet by mouth daily.   Stiolto Respimat 2.5-2.5 MCG/ACT Aers Generic drug: Tiotropium Bromide-Olodaterol Inhale 2 puffs into the lungs daily.   torsemide 100 MG tablet Commonly known as: DEMADEX TAKE 1 TABLET (100 MG TOTAL) BY MOUTH 2 (TWO) TIMES DAILY.   traMADol 50 MG tablet Commonly known as: ULTRAM SMARTSIG:1 Tablet(s) By Mouth Every 12 Hours PRN   Tresiba FlexTouch 200 UNIT/ML FlexTouch Pen Generic drug: insulin degludec INJECT 80 UNITS UNDER THE SKIN ONCE DAILY   vitamin B-12 1000 MCG tablet Commonly known  as: CYANOCOBALAMIN Take 1,000 mcg by mouth daily.   Vitamin D (Ergocalciferol) 1.25 MG (50000 UNIT) Caps capsule Commonly known as: DRISDOL Take 50,000 Units by mouth once a week.        Allergies:  Allergies  Allergen Reactions   Metolazone Other (See Comments)    Dizziness and falling Per patient in 2021, states she does not think she fell because of this medication.   Plavix [Clopidogrel Bisulfate] Other (See Comments)    High PRU's - non-responder   Ibuprofen Other (See Comments)    Pt states she is not supposed to take ibuprofen because of other meds she is taking   Nsaids Other (See Comments)    "I do not take NSAIDs d/t interfering with other meds"    Past Medical History:  Diagnosis Date   AICD (automatic cardioverter/defibrillator) present 04/04/2019   Asthma    CAD (coronary artery disease)    Chronic combined systolic (congestive) and diastolic (congestive) heart failure (HCC)    Chronic iron deficiency anemia    CVA (cerebral vascular accident) ([place]) 05/2018   Diabetes mellitus without complication ([place])    DOE (dyspnea on exertion)    Edema of both legs    Hepatic steatosis    Hypertension    Pulmonary emboli ([place]) 2014   Respiratory failure ([facility]) 09/2018   Seizure ([place]) 05/2018    Past Surgical History:  Procedure Laterality Date   BIOPSY  07/10/2018   Procedure: BIOPSY;  Surgeon: [name], MD;  Location: [place];  Service: Gastroenterology;;   COLONOSCOPY WITH PROPOFOL N/A 08/03/2013   Procedure: COLONOSCOPY WITH PROPOFOL;  Surgeon: Jeryl Columbia, MD;  Location: [facility];  Service: Endoscopy;  Laterality: N/A;   COLONOSCOPY WITH PROPOFOL N/A 07/10/2018   Procedure: COLONOSCOPY WITH PROPOFOL;  Surgeon: [name], MD;  Location: [place];  Service: Gastroenterology;  Laterality: N/A;   CORONARY ANGIOPLASTY WITH STENT PLACEMENT     CAD in 2006 x 2 and 2009 2 more- place din REx in [place] [facility] [place] [facility]   CORONARY ANGIOPLASTY WITH STENT  PLACEMENT  10/07/2013   Xience [place] DES 2.75  mm x 15  mm   ESOPHAGOGASTRODUODENOSCOPY (EGD) WITH PROPOFOL N/A 08/03/2013   Procedure: ESOPHAGOGASTRODUODENOSCOPY (EGD) WITH PROPOFOL;  Surgeon: Jeryl Columbia, MD;  Location: [facility];  Service: Endoscopy;  Laterality: N/A;   ESOPHAGOGASTRODUODENOSCOPY (EGD) WITH PROPOFOL N/A 07/10/2018   Procedure: ESOPHAGOGASTRODUODENOSCOPY (EGD) WITH PROPOFOL;  Surgeon: [name], MD;  Location: [place];  Service: Gastroenterology;  Laterality: N/A;   ICD IMPLANT  04/04/2019   ICD IMPLANT N/A 04/04/2019   Procedure: ICD IMPLANT;  Surgeon: Constance Haw, MD;  Location: [place] [facility];  Service: Cardiovascular;  Laterality: N/A;   LEFT HEART CATHETERIZATION WITH CORONARY ANGIOGRAM N/A 10/07/2013   Procedure: LEFT HEART CATHETERIZATION WITH CORONARY ANGIOGRAM;  Surgeon: Leonie Man, MD;  Location: [facility] C[facility];  Service: Cardiovascular;  Laterality: N/A;   RIGHT HEART CATH N/A 11/06/2018   Procedure: RIGHT HEART CATH;  Surgeon: Larey Dresser, MD;  Location: [place] [facility];  Service: Cardiovascular;  Laterality: N/A;   RIGHT/LEFT HEART CATH AND CORONARY ANGIOGRAPHY N/A 02/12/2019   Procedure: RIGHT/LEFT HEART CATH AND CORONARY ANGIOGRAPHY;  Surgeon: Larey Dresser, MD;  Location: [place] [facility];  Service: Cardiovascular;  Laterality: N/A;    Family History  Problem Relation Age of Onset   Stroke Mother    Hypertension Mother    Colon cancer Father    Diabetes Father    Heart attack Father    Sarcoidosis Other    Breast cancer Paternal Aunt    Emphysema Brother    Lung disease Brother        Unknown type, 3 brothers, one with liver and lung disease   Diabetes Brother    Asthma Paternal Aunt    Diabetes Sister     Social History:  reports that she quit smoking about 2 years ago. Her smoking use included cigarettes. She has a 10.00 pack-year smoking history. She has never used smokeless tobacco. She reports that she does not  currently use drugs after having used the following drugs: Cocaine. She reports that she does not drink alcohol.   Review of Systems  TSH was abnormal in 5/20 but subsequently normal   Lab Results  Component Value Date   TSH 4.26 02/17/2021   TSH 3.46 10/22/2019   TSH 3.11 05/08/2019   FREET4 0.83 05/08/2019     Lipid history: Has been treated with Crestor 40 mg daily, has history of CAD    Lab Results  Component Value Date   CHOL 147 02/17/2021   HDL 52.90 02/17/2021   LDLCALC 66 02/17/2021   TRIG 136.0 02/17/2021   CHOLHDL 3 02/17/2021           Hypertension: Treated with bisoprolol, also CHF has been present CHF treated with spironolactone and torsemide as well as isosorbide/hydralazine  BP Readings from Last 3 Encounters:  03/25/21 118/60  02/17/21 122/70  02/09/21 (!) 111/58   She has chronic renal insufficiency not related to diabetes,  followed by nephrologist including recently   Lab Results  Component Value Date   CREATININE 1.92 (H) 02/17/2021   CREATININE 2.40 (H) 02/08/2021   CREATININE 2.52 (H) 12/25/2019     Most recent foot exam: 9/22  Currently known complications of diabetes: Neuropathy, microalbuminuria Neuropathic symptoms mostly controlled with gabapentin  Other active problems are history of coronary artery disease, cardiomyopathy and COPD She is using oxygen  LABS:  No visits with results within 1 Week(s) from this visit.  Latest known visit with results is:  Office Visit on 02/17/2021  Component Date Value Ref Range Status   Hemoglobin A1C 02/17/2021 9.7 (A)  4.0 - 5.6 % Final   Glucose Fasting, POC 02/17/2021 186 (A)  70 -  99 mg/dL Final   TSH 02/17/2021 4.26  0.35 - 5.50 uIU/mL Final   Cholesterol 02/17/2021 147  0 - 200 mg/dL Final   ATP III Classification       Desirable:  < 200 mg/dL               Borderline High:  200 - 239 mg/dL          High:  > = 240 mg/dL   Triglycerides 02/17/2021 136.0  0.0 - 149.0 mg/dL Final    Normal:  <150 mg/dLBorderline High:  150 - 199 mg/dL   HDL 02/17/2021 52.90  >39.00 mg/dL Final   VLDL 02/17/2021 27.2  0.0 - 40.0 mg/dL Final   LDL Cholesterol 02/17/2021 66  0 - 99 mg/dL Final   Total CHOL/HDL Ratio 02/17/2021 3   Final                  Men          Women1/2 Average Risk     3.4          3.3Average Risk          5.0          4.42X Average Risk          9.6          7.13X Average Risk          15.0          11.0                       NonHDL 02/17/2021 93.69   Final   NOTE:  Non-HDL goal should be 30 mg/dL higher than patient's LDL goal (i.e. LDL goal of < 70 mg/dL, would have non-HDL goal of < 100 mg/dL)   Sodium 02/17/2021 141  135 - 145 mEq/L Final   Potassium 02/17/2021 4.0  3.5 - 5.1 mEq/L Final   Chloride 02/17/2021 100  96 - 112 mEq/L Final   CO2 02/17/2021 33 (A)  19 - 32 mEq/L Final   Glucose, Bld 02/17/2021 147 (A)  70 - 99 mg/dL Final   BUN 02/17/2021 56 (A)  6 - 23 mg/dL Final   Creatinine, Ser 02/17/2021 1.92 (A)  0.40 - 1.20 mg/dL Final   GFR 02/17/2021 27.84 (A)  >60.00 mL/min Final   Calculated using the CKD-EPI Creatinine Equation (2021)   Calcium 02/17/2021 9.8  8.4 - 10.5 mg/dL Final   Microalb, Ur 02/17/2021 4.3 (A)  0.0 - 1.9 mg/dL Final   Creatinine,U 02/17/2021 56.2  mg/dL Final   Microalb Creat Ratio 02/17/2021 7.7  0.0 - 30.0 mg/g Final    Physical Examination:     BP 118/60   Pulse 62   Ht 5' 7.5" (1.715 m)   Wt (!) 354 lb (160.6 kg)   LMP 07/28/2013 Comment: perimenopausal  SpO2 93%   BMI 54.63 kg/m     ASSESSMENT:  Diabetes type 2 on insulin  See history of present illness for detailed discussion of current diabetes management, blood sugar patterns and problems identified  A1c is significantly higher this year and last 9.7 She is on [place] and mealtime NovoLog with recently Ozempic  She thinks she is taking her insulin doses as directed but her blood sugars are still high However she is doing a little better with Ozempic  when she took a higher dose of 1 mg in the same week by mistake No nausea from this Likely  can do better with diet especially with avoiding drinks with sugar Needs significant amount of diabetes education   Hypertension: Controlled, she does monitor at home  History of CKD followed by nephrologist but is not on any medications such as [place] Clamp or SGLT2 drugs   PLAN:    Glucose monitoring: Start using freestyle libre, prescription will [facility]   2.  Diabetes education: Patient will need consultation with diabetes educator and will be referred again  He will cut back on any higher sugar drinks  4.  Medication changes needed: She will continue Ozempic with a prescription of 1 mg weekly now In the meantime she will increase her TRESIBA up to 94 units and only go back to 80 units once blood sugars are below 150 Also temporarily and go up to NovoLog 30 units to cover her meals Also will message her cardiologist regarding starting Jardiance, she does need to follow-up to help adjust the diuretics Discussed action of SGLT 2 drugs on lowering glucose by decreasing kidney absorption of glucose, benefits of weight loss and lower blood pressure, possible side effects including candidiasis and dosage regimen  She will need to monitor her blood pressure regularly while starting Jardiance No change in insulin as yet   Patient Instructions  Take 59m Ozempic weekly  Until sugar is < 150 go up to 94 units TRESIBA  Novolog for now 30 units with each meal  Avoid kool aid         AElayne Snare10/26/2022, 4:34 PM   Note: This office note was prepared with Dragon voice recognition system technology. Any transcriptional errors that result from this process are unintentional.

## 2021-03-26 ENCOUNTER — Other Ambulatory Visit: Payer: Self-pay

## 2021-03-26 ENCOUNTER — Telehealth: Payer: Self-pay | Admitting: Endocrinology

## 2021-03-26 ENCOUNTER — Other Ambulatory Visit: Payer: Self-pay | Admitting: Endocrinology

## 2021-03-26 ENCOUNTER — Other Ambulatory Visit (HOSPITAL_COMMUNITY): Payer: Self-pay

## 2021-03-26 DIAGNOSIS — Z794 Long term (current) use of insulin: Secondary | ICD-10-CM

## 2021-03-26 MED ORDER — FREESTYLE LIBRE 2 READER DEVI: 0 refills | Status: DC

## 2021-03-26 MED ORDER — EMPAGLIFLOZIN 10 MG PO TABS: 10.0000 mg | ORAL_TABLET | Freq: Every day | ORAL | 1 refills | Status: DC

## 2021-03-26 MED ORDER — FREESTYLE LIBRE 2 SENSOR MISC: 2.0000 | 3 refills | Status: DC

## 2021-03-26 NOTE — Telephone Encounter (Signed)
Pt calling that Ozempic and Jardiance needs a PA   [facility] [place] ([place]), [place] - [address]  Pt contact [phone]

## 2021-03-27 ENCOUNTER — Telehealth: Payer: Self-pay | Admitting: Pharmacy Technician

## 2021-03-27 ENCOUNTER — Other Ambulatory Visit (HOSPITAL_COMMUNITY): Payer: Self-pay

## 2021-03-27 NOTE — Telephone Encounter (Signed)
Patient Advocate Encounter   Received notification from CoverMyMeds/ pt/ office that prior authorization for Jardiance 10mg  is required by his/her insurance [place] Medicaid.   PA submitted on 10/288/11  Tracks conf. #: [id number] W Status is pending    [facility] will continue to follow:   Armanda Magic, CPhT Patient [facility] Phone: [phone] Fax:  [phone]

## 2021-03-27 NOTE — Telephone Encounter (Signed)
Patient Advocate Encounter   Received notification from CoverMyMeds/pt/ office message that prior authorization for Ozempic 1mg  is required by patient's insurance, [place] Medicaid.   PA submitted on 03/27/21 Confirmation # in NCTracks: [id number] W Status is pending    [facility] will continue to follow:   Armanda Magic, CPhT Patient [facility] Phone: [phone] Fax:  [phone]

## 2021-03-27 NOTE — Telephone Encounter (Signed)
Patient Advocate Encounter   Received notification from CoverMyMeds/ pt/ office that prior authorization for [employer] is required by his/her insurance [place] Medicaid.   PA submitted on 03/27/21 [facility] Tracks Confirmation #: [id number] W Status is pending    [facility] will continue to follow:   Armanda Magic, CPhT Patient [facility] Phone: [phone] Fax:  [phone]

## 2021-03-30 ENCOUNTER — Other Ambulatory Visit (HOSPITAL_COMMUNITY): Payer: Self-pay

## 2021-04-01 ENCOUNTER — Telehealth: Payer: Self-pay | Admitting: Pharmacy Technician

## 2021-04-01 ENCOUNTER — Other Ambulatory Visit (HOSPITAL_COMMUNITY): Payer: Self-pay

## 2021-04-01 DIAGNOSIS — E1165 Type 2 diabetes mellitus with hyperglycemia: Secondary | ICD-10-CM

## 2021-04-01 NOTE — Telephone Encounter (Signed)
Patient Advocate Encounter   Received notification that prior authorization for [facility] 2 was terminated in [employer]. Pt now has [facility] Medicaid.  Per Test Claim: Pt's ins changed from traditional [place] Medicaid to Managed care Medicaid through [facility]. PA still required.   PA submitted on 04/01/21 Key [id number] Status is pending    [facility] will continue to follow:   Armanda Magic, CPhT Patient [facility] Phone: [phone] Fax:  [phone]

## 2021-04-02 ENCOUNTER — Other Ambulatory Visit: Payer: Self-pay | Admitting: Adult Health

## 2021-04-02 ENCOUNTER — Other Ambulatory Visit (HOSPITAL_COMMUNITY): Payer: Self-pay

## 2021-04-02 DIAGNOSIS — J449 Chronic obstructive pulmonary disease, unspecified: Secondary | ICD-10-CM

## 2021-04-02 NOTE — Telephone Encounter (Signed)
Patient Advocate Encounter  Prior Authorization for Elenor [place] 2 has been approved.    PA# [id number]  Per Test Claim Patients co-pay is $0.    Armanda Magic, CPhT Patient [facility] Phone: [phone] Fax:  [phone]

## 2021-04-02 NOTE — Telephone Encounter (Addendum)
Patient Advocate Encounter  Received notification from [facility] that the request for prior authorization for [facility] 2 has been denied due to not meeting plan criteria.    Per your health plan's criteria, this product is covered if you meet the following: (1) One of the following: (A) Your insulin treatment regimen requires frequent adjustment (based on standard blood glucose monitor or non-therapeutic continuous glucose monitor testing). (B) You are using an external insulin pump.   PA#: [id number]  Armanda Magic, [facility] Patient [facility] Phone: [phone] Fax:  [phone]

## 2021-04-03 ENCOUNTER — Telehealth: Payer: Self-pay | Admitting: Endocrinology

## 2021-04-03 ENCOUNTER — Other Ambulatory Visit: Payer: Self-pay

## 2021-04-03 ENCOUNTER — Ambulatory Visit (HOSPITAL_BASED_OUTPATIENT_CLINIC_OR_DEPARTMENT_OTHER): Payer: Medicaid Other | Admitting: Family Medicine

## 2021-04-03 DIAGNOSIS — E1165 Type 2 diabetes mellitus with hyperglycemia: Secondary | ICD-10-CM

## 2021-04-03 DIAGNOSIS — Z794 Long term (current) use of insulin: Secondary | ICD-10-CM

## 2021-04-03 MED ORDER — FREESTYLE LIBRE 2 SENSOR MISC: 2.0000 | 3 refills | Status: DC

## 2021-04-03 MED ORDER — FREESTYLE LIBRE 2 READER DEVI: 0 refills | Status: DC

## 2021-04-03 MED ORDER — FREESTYLE LIBRE 2 READER DEVI
0 refills | Status: DC
Start: 2021-04-03 — End: 2021-09-02

## 2021-04-03 NOTE — Telephone Encounter (Signed)
Patient requests to be called at ph# [phone] re: Patient states she received a denial letter from [facility] for the [employer].

## 2021-04-03 NOTE — Addendum Note (Signed)
Addended by: Cinda Quest on: 04/03/2021 11:25 AM   Modules accepted: Orders

## 2021-04-03 NOTE — Telephone Encounter (Signed)
I called patient and informed her that PA was already done for the freestyle Libre 2 and it was approved. Rx was sent to local pharmacy this morning. She will reach out to them.

## 2021-04-07 NOTE — Telephone Encounter (Signed)
Called pt and states her daughter will put on her freestyle libre. Patient also states that she feels horrible congested, and diarrhea and doesn't think she will be able to come in and a week like you wanted her too.

## 2021-04-07 NOTE — Telephone Encounter (Signed)
Pt called to let us know she received her Freestyle [place], [place].  Pt contact [phone]

## 2021-04-08 NOTE — Telephone Encounter (Signed)
Patient has been notified

## 2021-04-21 ENCOUNTER — Other Ambulatory Visit: Payer: Self-pay | Admitting: Endocrinology

## 2021-04-21 ENCOUNTER — Other Ambulatory Visit (HOSPITAL_COMMUNITY): Payer: Self-pay | Admitting: Cardiology

## 2021-04-21 ENCOUNTER — Ambulatory Visit (INDEPENDENT_AMBULATORY_CARE_PROVIDER_SITE_OTHER): Payer: Medicaid Other

## 2021-04-21 DIAGNOSIS — I5022 Chronic systolic (congestive) heart failure: Secondary | ICD-10-CM

## 2021-04-21 DIAGNOSIS — I25119 Atherosclerotic heart disease of native coronary artery with unspecified angina pectoris: Secondary | ICD-10-CM

## 2021-04-21 DIAGNOSIS — I255 Ischemic cardiomyopathy: Secondary | ICD-10-CM

## 2021-04-22 LAB — CUP PACEART REMOTE DEVICE CHECK
Battery Remaining Longevity: 125 mo
Battery Voltage: 3.03 V
Brady Statistic RV Percent Paced: 0.06 %
Date Time Interrogation Session: 20221122102302
HighPow Impedance: 61 Ohm
Implantable Lead Implant Date: 20201104
Implantable Lead Location: 753860
Implantable Pulse Generator Implant Date: 20201104
Lead Channel Impedance Value: 304 Ohm
Lead Channel Impedance Value: 342 Ohm
Lead Channel Pacing Threshold Amplitude: 0.625 V
Lead Channel Pacing Threshold Pulse Width: 0.4 ms
Lead Channel Sensing Intrinsic Amplitude: 15.625 mV
Lead Channel Sensing Intrinsic Amplitude: 15.625 mV
Lead Channel Setting Pacing Amplitude: 2.5 V
Lead Channel Setting Pacing Pulse Width: 0.4 ms
Lead Channel Setting Sensing Sensitivity: 0.3 mV

## 2021-04-25 ENCOUNTER — Emergency Department (HOSPITAL_COMMUNITY)
Admission: EM | Admit: 2021-04-25 | Discharge: 2021-04-25 | Disposition: A | Discharge status: Home / Self Care | Payer: Medicaid Other | Attending: Emergency Medicine | Admitting: Emergency Medicine

## 2021-04-25 ENCOUNTER — Emergency Department (HOSPITAL_COMMUNITY): Payer: Medicaid Other

## 2021-04-25 ENCOUNTER — Encounter (HOSPITAL_COMMUNITY): Payer: Self-pay

## 2021-04-25 ENCOUNTER — Other Ambulatory Visit (HOSPITAL_COMMUNITY): Payer: Medicaid Other

## 2021-04-25 ENCOUNTER — Other Ambulatory Visit: Payer: Self-pay

## 2021-04-25 DIAGNOSIS — Z7984 Long term (current) use of oral hypoglycemic drugs: Secondary | ICD-10-CM | POA: Insufficient documentation

## 2021-04-25 DIAGNOSIS — R0602 Shortness of breath: Secondary | ICD-10-CM | POA: Diagnosis present

## 2021-04-25 DIAGNOSIS — I5042 Chronic combined systolic (congestive) and diastolic (congestive) heart failure: Secondary | ICD-10-CM | POA: Diagnosis not present

## 2021-04-25 DIAGNOSIS — J449 Chronic obstructive pulmonary disease, unspecified: Secondary | ICD-10-CM | POA: Diagnosis not present

## 2021-04-25 DIAGNOSIS — E1122 Type 2 diabetes mellitus with diabetic chronic kidney disease: Secondary | ICD-10-CM | POA: Diagnosis not present

## 2021-04-25 DIAGNOSIS — Z87891 Personal history of nicotine dependence: Secondary | ICD-10-CM | POA: Insufficient documentation

## 2021-04-25 DIAGNOSIS — I25118 Atherosclerotic heart disease of native coronary artery with other forms of angina pectoris: Secondary | ICD-10-CM | POA: Insufficient documentation

## 2021-04-25 DIAGNOSIS — Z794 Long term (current) use of insulin: Secondary | ICD-10-CM | POA: Diagnosis not present

## 2021-04-25 DIAGNOSIS — I130 Hypertensive heart and chronic kidney disease with heart failure and stage 1 through stage 4 chronic kidney disease, or unspecified chronic kidney disease: Secondary | ICD-10-CM | POA: Diagnosis not present

## 2021-04-25 DIAGNOSIS — Z7982 Long term (current) use of aspirin: Secondary | ICD-10-CM | POA: Insufficient documentation

## 2021-04-25 DIAGNOSIS — J45901 Unspecified asthma with (acute) exacerbation: Secondary | ICD-10-CM | POA: Diagnosis not present

## 2021-04-25 DIAGNOSIS — Z79899 Other long term (current) drug therapy: Secondary | ICD-10-CM | POA: Diagnosis not present

## 2021-04-25 DIAGNOSIS — N183 Chronic kidney disease, stage 3 unspecified: Secondary | ICD-10-CM | POA: Diagnosis not present

## 2021-04-25 LAB — CBC WITH DIFFERENTIAL/PLATELET
Abs Immature Granulocytes: 0.03 10*3/uL (ref 0.00–0.07)
Basophils Absolute: 0.1 10*3/uL (ref 0.0–0.1)
Basophils Relative: 1 %
Eosinophils Absolute: 0.2 10*3/uL (ref 0.0–0.5)
Eosinophils Relative: 3 %
HCT: 31.5 % — ABNORMAL LOW (ref 36.0–46.0)
Hemoglobin: 9.3 g/dL — ABNORMAL LOW (ref 12.0–15.0)
Immature Granulocytes: 0 %
Lymphocytes Relative: 29 %
Lymphs Abs: 2.4 10*3/uL (ref 0.7–4.0)
MCH: 25.8 pg — ABNORMAL LOW (ref 26.0–34.0)
MCHC: 29.5 g/dL — ABNORMAL LOW (ref 30.0–36.0)
MCV: 87.5 fL (ref 80.0–100.0)
Monocytes Absolute: 0.7 10*3/uL (ref 0.1–1.0)
Monocytes Relative: 8 %
Neutro Abs: 4.8 10*3/uL (ref 1.7–7.7)
Neutrophils Relative %: 59 %
Platelets: 297 10*3/uL (ref 150–400)
RBC: 3.6 MIL/uL — ABNORMAL LOW (ref 3.87–5.11)
RDW: 17.5 % — ABNORMAL HIGH (ref 11.5–15.5)
WBC: 8.1 10*3/uL (ref 4.0–10.5)
nRBC: 0 % (ref 0.0–0.2)

## 2021-04-25 LAB — BASIC METABOLIC PANEL
Anion gap: 8 (ref 5–15)
BUN: 46 mg/dL — ABNORMAL HIGH (ref 8–23)
CO2: 27 mmol/L (ref 22–32)
Calcium: 9.1 mg/dL (ref 8.9–10.3)
Chloride: 106 mmol/L (ref 98–111)
Creatinine, Ser: 2.3 mg/dL — ABNORMAL HIGH (ref 0.44–1.00)
GFR, Estimated: 24 mL/min — ABNORMAL LOW (ref >60)
Glucose, Bld: 136 mg/dL — ABNORMAL HIGH (ref 70–99)
Potassium: 4.4 mmol/L (ref 3.5–5.1)
Sodium: 141 mmol/L (ref 135–145)

## 2021-04-25 LAB — MAGNESIUM: Magnesium: 2.3 mg/dL (ref 1.7–2.4)

## 2021-04-25 MED ORDER — ALBUTEROL SULFATE (5 MG/ML) 0.5% IN NEBU: 2.5000 mg | INHALATION_SOLUTION | Freq: Four times a day (QID) | RESPIRATORY_TRACT | 0 refills | Status: DC | PRN

## 2021-04-25 MED ORDER — PROAIR HFA 108 (90 BASE) MCG/ACT IN AERS: 1.0000 | INHALATION_SPRAY | RESPIRATORY_TRACT | 0 refills | Status: DC | PRN

## 2021-04-25 MED ORDER — PREDNISONE 10 MG (21) PO TBPK: ORAL_TABLET | Freq: Every day | ORAL | 0 refills | Status: DC

## 2021-04-25 MED ORDER — IPRATROPIUM-ALBUTEROL 0.5-2.5 (3) MG/3ML IN SOLN
3.0000 mL | Freq: Once | RESPIRATORY_TRACT | Status: AC
  Administered 2021-04-25: 3 mL via RESPIRATORY_TRACT
  Filled 2021-04-25: qty 3

## 2021-04-25 NOTE — Discharge Instructions (Signed)
Follow-up with your primary care doctor for recheck on Monday.  Recommend taking course of steroids as prescribed.  Please monitor your sugars closely and discuss any issues with your primary care doctor.  If you have worsening difficulty breathing, chest pain or other new concerning symptom, come back to ER for reassessment.

## 2021-04-25 NOTE — ED Provider Notes (Signed)
[facility] EMERGENCY DEPARTMENT Provider Note   CSN: [id number] Arrival date & time: 04/25/21  1740     History Chief Complaint  Patient presents with   Bradycardia    Tamara Silva is a 60 y.o. female.  Presents to ER with concern for shortness of breath and low heart rate.  Patient reports that about a week and a half or so ago she had the flu.  States that her symptoms had resolved but then a couple days ago she started having increased cough and shortness of breath.  Some intermittent mucus production.  No blood.  States that she ran out of her albuterol and got very anxious today which prompted the call to EMS.  Her albuterol treatments at home had been providing relief.  She does endorse prior history of asthma and is chronically on 3 L nasal cannula.  EMS noted heart rate to be in the 40s when completing their assessment.  Patient has extensive past medical history including history of asthma/copd, CAD, heart failure, ICD placement, diabetes, PE  HPI     Past Medical History:  Diagnosis Date   AICD (automatic cardioverter/defibrillator) present 04/04/2019   Asthma    CAD (coronary artery disease)    Chronic combined systolic (congestive) and diastolic (congestive) heart failure (HCC)    Chronic iron deficiency anemia    CVA (cerebral vascular accident) ([place]) 05/2018   Diabetes mellitus without complication ([place])    DOE (dyspnea on exertion)    Edema of both legs    Hepatic steatosis    Hypertension    Pulmonary emboli ([place]) 2014   Respiratory failure ([place]) 09/2018   Seizure ([place]) 05/2018    Patient Active Problem List   Diagnosis Date Noted   NPDR (nonproliferative diabetic retinopathy) ([place]) 08/06/2020   Anemia of chronic disease 01/25/2020   Coronary artery disease involving native coronary artery of native heart with angina pectoris ([place]) 81/44/8185   Chronic systolic (congestive) heart failure ([place]) 04/04/2019   Diabetic peripheral  neuropathy ([place]) 01/19/2019   Former smoker 01/19/2019   CKD (chronic kidney disease) stage 3, GFR 30-59 ml/min ([place]) 01/19/2019   Cardiac arrest ([place]) 10/28/2018   Noncompliance with treatment plan 07/24/2018   General patient noncompliance 07/24/2018   Noncompliance with diet and medication regimen 07/24/2018   Dysarthria    BiPAP (biphasic positive airway pressure) dependence    Type II diabetes mellitus with renal manifestations ([place]) 07/08/2018   Hepatic steatosis 06/22/2018   Chronic respiratory failure with hypoxia and hypercapnia ([place])    On home O2    Slow transit constipation    Cocaine abuse ([place])    Diabetes mellitus type 2 in obese ([place])    Cerebral embolism with cerebral infarction 06/01/2018   Pressure injury of skin 05/30/2018   Iron deficiency anemia 01/27/2018   Tobacco abuse disorder 10/03/2017   Dyslipidemia 04/29/2016   Primary insomnia 04/29/2016   Obesity hypoventilation syndrome (HCC)    OSA (obstructive sleep apnea)    Dyslipidemia associated with type 2 diabetes mellitus ([place]) 05/21/2015   COPD GOLD III  06/10/2014   CAD S/P percutaneous coronary angioplasty - prior PCI to LAD; RCA PCI: new Xience Alpine DES 2.75 mm x 15 mm  10/07/2013   Morbid obesity ([place]) 07/30/2013   Chronic combined systolic and diastolic heart failure ([place]) 07/10/2013   Bipolar disorder ([place]) 02/08/2013   Essential hypertension 02/08/2013   COPD (chronic obstructive pulmonary disease) ([place]) 10/15/2012    Past Surgical History:  Procedure Laterality Date   BIOPSY  07/10/2018   Procedure: BIOPSY;  Surgeon: [name], MD;  Location: [facility];  Service: Gastroenterology;;   COLONOSCOPY WITH PROPOFOL N/A 08/03/2013   Procedure: COLONOSCOPY WITH PROPOFOL;  Surgeon: Jeryl Columbia, MD;  Location: [facility];  Service: Endoscopy;  Laterality: N/A;   COLONOSCOPY WITH PROPOFOL N/A 07/10/2018   Procedure: COLONOSCOPY WITH PROPOFOL;  Surgeon: [name], MD;  Location: [facility];   Service: Gastroenterology;  Laterality: N/A;   CORONARY ANGIOPLASTY WITH STENT PLACEMENT     CAD in 2006 x 2 and 2009 2 more- place din REx in [place] and [place] [facility]   CORONARY ANGIOPLASTY WITH STENT PLACEMENT  10/07/2013   Xience Alpine DES 2.75  mm x 15  mm   ESOPHAGOGASTRODUODENOSCOPY (EGD) WITH PROPOFOL N/A 08/03/2013   Procedure: ESOPHAGOGASTRODUODENOSCOPY (EGD) WITH PROPOFOL;  Surgeon: Jeryl Columbia, MD;  [facility];  Service: Endoscopy;  Laterality: N/A;   ESOPHAGOGASTRODUODENOSCOPY (EGD) WITH PROPOFOL N/A 07/10/2018   Procedure: ESOPHAGOGASTRODUODENOSCOPY (EGD) WITH PROPOFOL;  Surgeon: [name], MD;  Location: [facility];  Service: Gastroenterology;  Laterality: N/A;   ICD IMPLANT  04/04/2019   ICD IMPLANT N/A 04/04/2019   Procedure: ICD IMPLANT;  Surgeon: Constance Haw, MD;  Location: [facility];  Service: Cardiovascular;  Laterality: N/A;   LEFT HEART CATHETERIZATION WITH CORONARY ANGIOGRAM N/A 10/07/2013   Procedure: LEFT HEART CATHETERIZATION WITH CORONARY ANGIOGRAM;  Surgeon: Leonie Man, MD;  Location: [facility] CATH LAB;  Service: Cardiovascular;  Laterality: N/A;   RIGHT HEART CATH N/A 11/06/2018   Procedure: RIGHT HEART CATH;  Surgeon: Larey Dresser, MD;  Location: [facility];  Service: Cardiovascular;  Laterality: N/A;   RIGHT/LEFT HEART CATH AND CORONARY ANGIOGRAPHY N/A 02/12/2019   Procedure: RIGHT/LEFT HEART CATH AND CORONARY ANGIOGRAPHY;  Surgeon: Larey Dresser, MD;  Location: [facility];  Service: Cardiovascular;  Laterality: N/A;     OB History   No obstetric history on file.     Family History  Problem Relation Age of Onset   Stroke Mother    Hypertension Mother    Colon cancer Father    Diabetes Father    Heart attack Father    Sarcoidosis Other    Breast cancer Paternal Aunt    Emphysema Brother    Lung disease Brother        Unknown type, 3 brothers, one with liver and lung disease   Diabetes Brother    Asthma  Paternal Aunt    Diabetes Sister     Social History   Tobacco Use   Smoking status: Former    Packs/day: 0.50    Years: 20.00    Pack years: 10.00    Types: Cigarettes    Quit date: 05/31/2018    Years since quitting: 2.9   Smokeless tobacco: Never   Tobacco comments:    smoked off and on x 20 years  Vaping Use   Vaping Use: Never used  Substance Use Topics   Alcohol use: No    Alcohol/week: 0.0 standard drinks   Drug use: Not Currently    Types: Cocaine    Home Medications Prior to Admission medications   Medication Sig Start Date End Date Taking? Authorizing Provider  albuterol (PROVENTIL) (5 MG/ML) 0.5% nebulizer solution Take 0.5 mLs (2.5 mg total) by nebulization every 6 (six) hours as needed for wheezing or shortness of breath. 04/25/21  Yes Lucrezia Starch, MD  predniSONE [facility]  UNI-PAK 21 TAB) 10 MG (21) TBPK tablet Take by mouth daily. Take 6 tabs by mouth daily  for 1 day, then 5 tabs for 1 day, then 4 tabs for 1 day, then 3 tabs for 1 day, 2 tabs for 1 day, then 1 tab by mouth daily for 1 day 04/25/21  Yes Travonta Gill, Ellwood Dense, MD  ACCU-CHEK AVIVA PLUS test strip USE 1 STRIP TO CHECK BLOOD SUGAR 3 TIMES A DAY . 03/20/21   Elayne Snare, MD  acetaminophen (TYLENOL) 500 MG tablet Take 500 mg by mouth every 6 (six) hours as needed for moderate pain or headache.    [provider]  ANORO ELLIPTA 62.5-25 MCG/INH AEPB INHALE 1 PUFF INTO THE LUNGS DAILY. 02/11/21   Olalere, Ernesto Rutherford, MD  ascorbic acid (VITAMIN C) 500 MG tablet TAKE ONE TABLET BY MOUTH ONCE DAILY 04/15/20   Ledell Peoples IV, MD  ASPIRIN LOW DOSE 81 MG EC tablet CHEW 1 TABLET (81 MG TOTAL) BY MOUTH DAILY. 11/27/19   Ladell Pier, MD  BIDIL 20-37.5 MG tablet TAKE 1 TABLET BY MOUTH 3 (THREE) TIMES DAILY. 10/17/20   Larey Dresser, MD  bisoprolol (ZEBETA) 5 MG tablet TAKE 1 TABLET (5 MG TOTAL) BY MOUTH DAILY. 04/21/21   Larey Dresser, MD  Blood Glucose Monitoring Suppl (ACCU-CHEK GUIDE)  w/Device KIT Use accu chek guide to check blood sugar three times daily. 10/09/19   Elayne Snare, MD  Continuous Blood Gluc Receiver (FREESTYLE LIBRE 2 READER) DEVI Use to check blood sugar daily. 04/03/21   Elayne Snare, MD  Continuous Blood Gluc Sensor (FREESTYLE LIBRE 2 SENSOR) MISC 2 Devices by Does not apply route every 14 (fourteen) days. 04/03/21   Elayne Snare, MD  empagliflozin (JARDIANCE) 10 MG TABS tablet Take 1 tablet (10 mg total) by mouth daily before breakfast. 03/26/21   Elayne Snare, MD  FEROSUL 325 (65 Fe) MG tablet TAKE 1 TABLET (325 MG TOTAL) BY MOUTH DAILY AFTER SUPPER. PLEASE TAKE WITH A SOURCE OF VITAMIN C 06/18/20   Orson Slick, MD  fluticasone (FLONASE) 50 MCG/ACT nasal spray PLACE 1 SPRAY INTO BOTH NOSTRILS DAILY. 09/27/19   Ladell Pier, MD  hydrocerin (EUCERIN) CREA Apply 1 application topically 2 (two) times daily. 06/22/18   Love, Ivan Anchors, PA-C  insulin degludec (TRESIBA FLEXTOUCH) 200 UNIT/ML FlexTouch Pen INJECT 80 UNITS UNDER THE SKIN ONCE DAILY 03/12/21   Elayne Snare, MD  Insulin Pen Needle (EASY COMFORT PEN NEEDLES) 31G X 5 MM MISC USE 3 TIMES A DAY FOR INSULIN ADMINISTRATION 02/17/21   Elayne Snare, MD  ipratropium-albuterol (DUONEB) 0.5-2.5 (3) MG/3ML SOLN INHALE CONTENTS OF ONE VIAL USING NEBULIZER EVERY 6 HOURS AS NEEDED FOR SHORTNESS OF BREATH, WHEEZING 07/22/20   Parrett, Fonnie Mu, NP  metolazone (ZAROXOLYN) 2.5 MG tablet Take 1 tablet (2.5 mg total) by mouth once a week. APPT NEEDED FOR FURTHER REFILLS. PLEASE CALL [phone] 12/02/20   Larey Dresser, MD  NEEDLE, DISP, 26 G 26G X 1/2" MISC 1 application by Does not apply route daily. 06/22/18   Love, Ivan Anchors, PA-C  NOVOLOG FLEXPEN 100 UNIT/ML FlexPen Inject 20 units under the skin at breakfast and lunch, and 24 units at dinner. 02/17/21   Elayne Snare, MD  omeprazole (PRILOSEC) 40 MG capsule TAKE 1 CAPSULE (40 MG TOTAL) BY MOUTH DAILY. 06/26/20   Ladell Pier, MD  potassium chloride SA (KLOR-CON) 20 MEQ  tablet TAKE 2 TABLETS (40 MEQ TOTAL) BY MOUTH  DAILY. 07/15/20   Lorretta Harp, MD  PROAIR HFA 108 [phone] Base) MCG/ACT inhaler Inhale 1-2 puffs into the lungs every 4 (four) hours as needed for wheezing or shortness of breath. 04/25/21   Lucrezia Starch, MD  RESTASIS 0.05 % ophthalmic emulsion Place 1 drop into both eyes daily as needed.  12/08/18   [provider]  rosuvastatin (CRESTOR) 40 MG tablet TAKE 1 TABLET (40 MG TOTAL) BY MOUTH DAILY AT 6 PM. 04/22/21   Elayne Snare, MD  sacubitril-valsartan (ENTRESTO) 24-26 MG Take 1 tablet by mouth 2 (two) times daily. Needs appt for further refills 03/25/21   Larey Dresser, MD  Semaglutide, 1 MG/DOSE, (OZEMPIC, 1 MG/DOSE,) 2 MG/1.5ML SOPN Inject 1 mg into the skin once a week. 03/25/21   Elayne Snare, MD  spironolactone (ALDACTONE) 25 MG tablet Take 1 tablet by mouth daily. 10/14/20   [provider]  Tiotropium Bromide-Olodaterol (STIOLTO RESPIMAT) 2.5-2.5 MCG/ACT AERS Inhale 2 puffs into the lungs daily. 07/24/20   Parrett, Fonnie Mu, NP  torsemide (DEMADEX) 100 MG tablet TAKE 1 TABLET (100 MG TOTAL) BY MOUTH 2 (TWO) TIMES DAILY. 01/26/21   Larey Dresser, MD  traMADol Veatrice Bourbon) 50 MG tablet SMARTSIG:1 Tablet(s) By Mouth Every 12 Hours PRN 02/14/21   [provider]  vitamin B-12 (CYANOCOBALAMIN) 1000 MCG tablet Take 1,000 mcg by mouth daily.    [provider]  Vitamin D, Ergocalciferol, (DRISDOL) 1.25 MG (50000 UNIT) CAPS capsule Take 50,000 Units by mouth once a week. 04/15/20   [provider]    Allergies    Metolazone, Plavix [clopidogrel bisulfate], Ibuprofen, and Nsaids  Review of Systems   Review of Systems  Constitutional:  Negative for chills and fever.  HENT:  Negative for ear pain and sore throat.   Eyes:  Negative for pain and visual disturbance.  Respiratory:  Positive for cough and shortness of breath.   Cardiovascular:  Negative for chest pain and palpitations.  Gastrointestinal:   Negative for abdominal pain and vomiting.  Genitourinary:  Negative for dysuria and hematuria.  Musculoskeletal:  Negative for arthralgias and back pain.  Skin:  Negative for color change and rash.  Neurological:  Negative for seizures and syncope.  All other systems reviewed and are negative.  Physical Exam Updated Vital Signs BP (!) 134/56   Pulse 62   Temp 98.5 F (36.9 C) (Oral)   Resp 19   Ht 5' 7"  (1.702 m)   Wt (!) 157.4 kg   LMP 07/28/2013 Comment: perimenopausal  SpO2 100%   BMI 54.35 kg/m   Physical Exam Vitals and nursing note reviewed.  Constitutional:      General: She is not in acute distress.    Appearance: She is well-developed. She is obese.  HENT:     Head: Normocephalic and atraumatic.  Eyes:     Conjunctiva/sclera: Conjunctivae normal.  Cardiovascular:     Rate and Rhythm: Normal rate and regular rhythm.     Heart sounds: No murmur heard. Pulmonary:     Effort: Pulmonary effort is normal.     Comments: There is mild expiratory wheeze noted bilaterally, no crackles noted Abdominal:     Palpations: Abdomen is soft.     Tenderness: There is no abdominal tenderness.  Musculoskeletal:        General: No swelling.     Cervical back: Neck supple.  Skin:    General: Skin is warm and dry.     Capillary Refill: Capillary refill  takes less than 2 seconds.  Neurological:     Mental Status: She is alert.  Psychiatric:        Mood and Affect: Mood normal.    ED Results / Procedures / Treatments   Labs (all labs ordered are listed, but only abnormal results are displayed) Labs Reviewed  CBC WITH DIFFERENTIAL/PLATELET - Abnormal; Notable for the following components:      Result Value   RBC 3.60 (*)    Hemoglobin 9.3 (*)    HCT 31.5 (*)    MCH 25.8 (*)    MCHC 29.5 (*)    RDW 17.5 (*)    All other components within normal limits  BASIC METABOLIC PANEL - Abnormal; Notable for the following components:   Glucose, Bld 136 (*)    BUN 46 (*)     Creatinine, Ser 2.30 (*)    GFR, Estimated 24 (*)    All other components within normal limits  MAGNESIUM    EKG EKG Interpretation  Date/Time:  Saturday April 25 2021 10:11:03 EST Ventricular Rate:  50 PR Interval:  178 QRS Duration: 100 QT Interval:  489 QTC Calculation: 468 R Axis:   32 Text Interpretation: Sinus rhythm Atrial premature complex Inferior infarct, old Anterior infarct, old Confirmed by Madalyn Rob [phone]) on 04/25/2021 10:41:24 AM  Radiology DG Chest Portable 1 View  Result Date: 04/25/2021 CLINICAL DATA:  Shortness of breath EXAM: PORTABLE CHEST 1 VIEW COMPARISON:  Previous studies including the examination of 02/09/2021 FINDINGS: Transverse diameter of heart is increased. Central pulmonary vessels are prominent. There are no signs of alveolar pulmonary edema. There are patchy densities in the left upper lung fields. Lateral CP angles are indistinct. There is no pneumothorax. IMPRESSION: Cardiomegaly. Central pulmonary vessels are prominent possibly suggesting mild CHF. Increased markings in the left upper lung fields have not changed, possibly suggesting scarring. Lateral CP angles are indistinct suggesting possible small pleural effusions. There is no pneumothorax. Electronically Signed   By: Elmer Picker M.D.   On: 04/25/2021 10:03    Procedures Procedures   Medications Ordered in ED Medications  ipratropium-albuterol (DUONEB) 0.5-2.5 (3) MG/3ML nebulizer solution 3 mL (3 mLs Nebulization Given 04/25/21 1022)    ED Course  I have reviewed the triage vital signs and the nursing notes.  Pertinent labs & imaging results that were available during my care of the patient were reviewed by me and considered in my medical decision making (see chart for details).    MDM Rules/Calculators/A&P                           60 year old lady with extensive past medical history including history of COPD/asthma on 3 L nasal cannula chronically, heart  failure s/p ICD placement, diabetes presenting to the emergency room with concern for shortness of breath.  EMS noted patient to be bradycardic to the 40s.  On arrival here patient was well-appearing in no distress, no hypoxia on her home oxygen, she did have expiratory wheezing on exam.  Suspect she may be having COPD/asthma exacerbation.  On reassessment wheezing has improved.  Discussed risks and benefits of steroids given her diabetes history, patient would like to proceed with course of prednisone.  Instructed her to monitor her sugars closely and have close recheck with her primary care doctor.  Regarding the bradycardia, EKG here shows normal sinus rhythm, heart rate of 60s.  Did occasionally dip into the 50s but during most of the  ER stay her heart rate was in the 60s.  Blood pressure was stable.  Given her overall clinical appearance, believe she is stable for discharge home at present.  Patient requested refill for ProAir and albuterol nebulizer solution.  After the discussed management above, the patient was determined to be safe for discharge.  The patient was in agreement with this plan and all questions regarding their care were answered.  ED return precautions were discussed and the patient will return to the ED with any significant worsening of condition.  Final Clinical Impression(s) / ED Diagnoses Final diagnoses:  Exacerbation of asthma, unspecified asthma severity, unspecified whether persistent    Rx / DC Orders ED Discharge Orders          Ordered    PROAIR HFA 108 (90 Base) MCG/ACT inhaler  Every 4 hours PRN        04/25/21 1113    albuterol (PROVENTIL) (5 MG/ML) 0.5% nebulizer solution  Every 6 hours PRN        04/25/21 1113    predniSONE (STERAPRED UNI-PAK 21 TAB) 10 MG (21) TBPK tablet  Daily        04/25/21 1113             Lucrezia Starch, MD 04/25/21 1117

## 2021-04-25 NOTE — ED Triage Notes (Signed)
Pt BIB [facility] from home initially called out for a breathing treatment pt ran out of her albuterol. EMS arrived and put her on the monitor and pt's HR has been as low as 39. EMS stated it looked like some junctional rhythms. Normally pt has a HR in the 50's-60's.  Pt denies any pain.

## 2021-04-30 ENCOUNTER — Other Ambulatory Visit: Payer: Self-pay | Admitting: Endocrinology

## 2021-04-30 ENCOUNTER — Telehealth: Payer: Self-pay

## 2021-04-30 ENCOUNTER — Other Ambulatory Visit (HOSPITAL_COMMUNITY): Payer: Self-pay

## 2021-04-30 DIAGNOSIS — E1165 Type 2 diabetes mellitus with hyperglycemia: Secondary | ICD-10-CM

## 2021-04-30 DIAGNOSIS — Z794 Long term (current) use of insulin: Secondary | ICD-10-CM

## 2021-04-30 NOTE — Progress Notes (Signed)
Remote ICD transmission.   

## 2021-04-30 NOTE — Telephone Encounter (Signed)
Patient Advocate Encounter   Received notification from [facility] that prior authorization for Tyler Aas Flextouch is required by his/her insurance [employer].   PA submitted on 04/30/2021  Key#: [id number]  Status is pending    [facility] will continue to follow:  Patient Advocate Fax:  [phone]

## 2021-05-04 ENCOUNTER — Other Ambulatory Visit (HOSPITAL_COMMUNITY): Payer: Self-pay

## 2021-05-04 NOTE — Telephone Encounter (Signed)
Patient Advocate Encounter  Prior Authorization for Tamara Silva has been approved.    PA# [id number]  Effective dates: 05/01/2021 through 05/01/2022  Per Test Claim Patients co-pay is $4.   Spoke with Pharmacy to Process.  Patient Advocate Fax:  [phone]

## 2021-05-05 ENCOUNTER — Other Ambulatory Visit (HOSPITAL_COMMUNITY): Payer: Self-pay

## 2021-05-05 ENCOUNTER — Ambulatory Visit: Payer: Medicaid Other | Admitting: Nutrition

## 2021-05-11 ENCOUNTER — Ambulatory Visit: Payer: Medicaid Other | Admitting: Endocrinology

## 2021-05-14 ENCOUNTER — Other Ambulatory Visit: Payer: Self-pay | Admitting: Hematology and Oncology

## 2021-05-18 ENCOUNTER — Telehealth: Payer: Self-pay | Admitting: Adult Health

## 2021-05-18 ENCOUNTER — Other Ambulatory Visit: Payer: Self-pay | Admitting: Pulmonary Disease

## 2021-05-18 ENCOUNTER — Telehealth: Payer: Self-pay | Admitting: Pulmonary Disease

## 2021-05-18 MED ORDER — PREDNISONE 10 MG PO TABS: 20.0000 mg | ORAL_TABLET | Freq: Every day | ORAL | 0 refills | Status: DC

## 2021-05-18 MED ORDER — ALBUTEROL SULFATE (2.5 MG/3ML) 0.083% IN NEBU: 2.5000 mg | INHALATION_SOLUTION | RESPIRATORY_TRACT | 6 refills | Status: DC | PRN

## 2021-05-18 NOTE — Telephone Encounter (Signed)
I called the patient and gave her the recommendations per Dr. Jenetta Downer, and she voices understanding. Noting further needed.

## 2021-05-18 NOTE — Telephone Encounter (Signed)
Continue continue Anoro regularly  Albuterol nebulization every 4 sent into pharmacy  Does not need DuoNeb if she is using Anoro regularly  Prescription for prednisone for 7 days sent in as well

## 2021-05-18 NOTE — Telephone Encounter (Signed)
Spoke with pt who states she has been using albuterol nebulizer 6 -8 times a day r/t increased SOB. Pt states she has not been using Duonebs and does not have any to use. Pt does not have a rescue inhaler. Pt is using Anoro as directed. Dr. Ander Slade pt requesting Albuterol nebs be changed to every 4 hours as needed and refills on Duoneb and Albuterol neb. Please advise.

## 2021-05-20 ENCOUNTER — Other Ambulatory Visit (HOSPITAL_COMMUNITY): Payer: Self-pay | Admitting: Cardiology

## 2021-05-26 NOTE — Telephone Encounter (Signed)
See other phone note from 05/18/21. Will close encounter.

## 2021-05-28 ENCOUNTER — Encounter (HOSPITAL_BASED_OUTPATIENT_CLINIC_OR_DEPARTMENT_OTHER): Payer: Self-pay | Admitting: Family Medicine

## 2021-05-28 ENCOUNTER — Other Ambulatory Visit: Payer: Self-pay

## 2021-05-28 ENCOUNTER — Ambulatory Visit (HOSPITAL_BASED_OUTPATIENT_CLINIC_OR_DEPARTMENT_OTHER): Payer: Medicaid Other | Admitting: Family Medicine

## 2021-05-28 ENCOUNTER — Ambulatory Visit (INDEPENDENT_AMBULATORY_CARE_PROVIDER_SITE_OTHER): Payer: Medicaid Other | Admitting: Family Medicine

## 2021-05-28 VITALS — BP 130/69 | HR 73 | Ht 67.0 in | Wt 350.0 lb

## 2021-05-28 DIAGNOSIS — I10 Essential (primary) hypertension: Secondary | ICD-10-CM | POA: Diagnosis not present

## 2021-05-28 DIAGNOSIS — J449 Chronic obstructive pulmonary disease, unspecified: Secondary | ICD-10-CM

## 2021-05-28 DIAGNOSIS — E669 Obesity, unspecified: Secondary | ICD-10-CM

## 2021-05-28 DIAGNOSIS — E1169 Type 2 diabetes mellitus with other specified complication: Secondary | ICD-10-CM

## 2021-05-28 DIAGNOSIS — E113299 Type 2 diabetes mellitus with mild nonproliferative diabetic retinopathy without macular edema, unspecified eye: Secondary | ICD-10-CM | POA: Diagnosis not present

## 2021-05-28 NOTE — Patient Instructions (Signed)
°  Medication Instructions:  Your physician recommends that you continue on your current medications as directed. Please refer to the Current Medication list given to you today. --If you need a refill on any your medications before your next appointment, please call your pharmacy first. If no refills are authorized on file call the office.-- Referrals/Procedures/Imaging: A referral has been placed for you to Dr. Delman Cheadle for evaluation and treatment. Someone from the scheduling department will be in contact with you in regards to coordinating your consultation. If you do not hear from any of the schedulers within 7-10 business days please give their office a call.  [facility] [place], [place] [id number] [phone]  Follow-Up: Your next appointment:   Your physician recommends that you schedule a follow-up appointment in: 2-3 MONTHS with Dr. de [place]  You will receive a text message or e-mail with a link to a survey about your care and experience with [place] today! We would greatly appreciate your feedback!   Thanks for letting us be apart of your health journey!!  Primary Care and Sports Medicine   Dr. Arlina Robes [place]   We encourage you to activate your patient portal called "MyChart".  Sign up information is provided on this After Visit Summary.  MyChart is used to connect with patients for Virtual Visits (Telemedicine).  Patients are able to view lab/test results, encounter notes, upcoming appointments, etc.  Non-urgent messages can be sent to your provider as well. To learn more about what you can do with MyChart, please visit --  [email or web address].

## 2021-05-28 NOTE — Progress Notes (Signed)
New Patient Office Visit  Subjective:  Patient ID: Tamara Silva, female    DOB: 1960/08/04  Age: 60 y.o. MRN: [id number]  CC:  Chief Complaint  Patient presents with   Establish Care    Prior PCP - Dr. Lucianne Muss - endocrinology   Medication Refill    Patient is requesting medication refill on pro air, enora elipta, and ozempic.    HPI Tamara Silva is a 60 year old female presenting to establish in clinic.  She has current concerns as outlined above.  Past medical history significant for diabetes, anemia, CAD, hepatic steatosis, hypertension.  Diabetes: Last hemoglobin A1c was 9.7% in September 2022.  She does follow with endocrinology.  Control of blood sugars has been variable over the years.  Current medications include Tresiba 80 units daily, mealtime NovoLog, semaglutide.  She is requesting refill of Ozempic today.  Reportedly was also started on Jardiance, however patient reports that she did not continue his medication, initially was unsure why, then indicates that she thinks was because of a commercial on TV. Patient with history of diabetic retinopathy, was seeing Dr. Dione Booze, requesting referral to ophthalmologist today  Hypertension, CAD, CHF: Current medications include Entresto, spironolactone, torsemide.  She does follow with cardiology.  She denies any current issues today  COPD: Follows with pulmonology, current medications include Anoro Ellipta, albuterol as needed.  Requesting refill of her an oral Ellipta today, does not have any other concerns regarding this at present  Patient does follow with Dr. Malen Gauze at [facility] related to CKD.  Review of labs indicate CKD stage IV.  Past Medical History:  Diagnosis Date   AICD (automatic cardioverter/defibrillator) present 04/04/2019   Asthma    CAD (coronary artery disease)    Chronic combined systolic (congestive) and diastolic (congestive) heart failure (HCC)    Chronic iron deficiency anemia    CVA (cerebral  vascular accident) (HCC) 05/2018   Diabetes mellitus without complication (HCC)    DOE (dyspnea on exertion)    Edema of both legs    Hepatic steatosis    Hypertension    Pulmonary emboli (HCC) 2014   Respiratory failure (HCC) 09/2018   Seizure (HCC) 05/2018    Past Surgical History:  Procedure Laterality Date   BIOPSY  07/10/2018   Procedure: BIOPSY;  Surgeon: Kerin Salen, MD;  Location: [facility] ENDOSCOPY;  Service: Gastroenterology;;   COLONOSCOPY WITH PROPOFOL N/A 08/03/2013   Procedure: COLONOSCOPY WITH PROPOFOL;  Surgeon: Petra Kuba, MD;  Location: [facility] ENDOSCOPY;  Service: Endoscopy;  Laterality: N/A;   COLONOSCOPY WITH PROPOFOL N/A 07/10/2018   Procedure: COLONOSCOPY WITH PROPOFOL;  Surgeon: Kerin Salen, MD;  Location: [facility] ENDOSCOPY;  Service: Gastroenterology;  Laterality: N/A;   CORONARY ANGIOPLASTY WITH STENT PLACEMENT     CAD in 2006 x 2 and 2009 2 more- place din REx in [place] and [place] [facility]   CORONARY ANGIOPLASTY WITH STENT PLACEMENT  10/07/2013   Xience Alpine DES 2.75  mm x 15  mm   ESOPHAGOGASTRODUODENOSCOPY (EGD) WITH PROPOFOL N/A 08/03/2013   Procedure: ESOPHAGOGASTRODUODENOSCOPY (EGD) WITH PROPOFOL;  Surgeon: Petra Kuba, MD;  Location: [facility] ENDOSCOPY;  Service: Endoscopy;  Laterality: N/A;   ESOPHAGOGASTRODUODENOSCOPY (EGD) WITH PROPOFOL N/A 07/10/2018   Procedure: ESOPHAGOGASTRODUODENOSCOPY (EGD) WITH PROPOFOL;  Surgeon: Kerin Salen, MD;  Location: [facility] ENDOSCOPY;  Service: Gastroenterology;  Laterality: N/A;   ICD IMPLANT  04/04/2019   ICD IMPLANT N/A 04/04/2019   Procedure: ICD IMPLANT;  Surgeon: Regan Lemming, MD;  Location: [facility] INVASIVE CV LAB;  Service: Cardiovascular;  Laterality: N/A;   LEFT HEART CATHETERIZATION WITH CORONARY ANGIOGRAM N/A 10/07/2013   Procedure: LEFT HEART CATHETERIZATION WITH CORONARY ANGIOGRAM;  Surgeon: Marykay Lex, MD;  Location: [facility] CATH LAB;  Service: Cardiovascular;  Laterality: N/A;   RIGHT HEART CATH N/A 11/06/2018   Procedure: RIGHT HEART  CATH;  Surgeon: Laurey Morale, MD;  Location: [facility] INVASIVE CV LAB;  Service: Cardiovascular;  Laterality: N/A;   RIGHT/LEFT HEART CATH AND CORONARY ANGIOGRAPHY N/A 02/12/2019   Procedure: RIGHT/LEFT HEART CATH AND CORONARY ANGIOGRAPHY;  Surgeon: Laurey Morale, MD;  Location: [facility] INVASIVE CV LAB;  Service: Cardiovascular;  Laterality: N/A;    Family History  Problem Relation Age of Onset   Stroke Mother    Hypertension Mother    Colon cancer Father    Diabetes Father    Heart attack Father    Sarcoidosis Other    Breast cancer Paternal Aunt    Emphysema Brother    Lung disease Brother        Unknown type, 3 brothers, one with liver and lung disease   Diabetes Brother    Asthma Paternal Aunt    Diabetes Sister     Social History   Socioeconomic History   Marital status: Single    Spouse name: Not on file   Number of children: 1   Years of education: Not on file   Highest education level: Not on file  Occupational History   Not on file  Tobacco Use   Smoking status: Former    Packs/day: 0.50    Years: 20.00    Pack years: 10.00    Types: Cigarettes    Quit date: 05/31/2018    Years since quitting: 3.2   Smokeless tobacco: Never   Tobacco comments:    smoked off and on x 20 years  Vaping Use   Vaping Use: Never used  Substance and Sexual Activity   Alcohol use: No    Alcohol/week: 0.0 standard drinks   Drug use: Not Currently    Types: Cocaine   Sexual activity: Not Currently  Other Topics Concern   Not on file  Social History Narrative   Origibnally from [place]   Most recently from [place]    Daughter lives in town      On Social security to CHF, COPD, CAD   Social Determinants of Health   Financial Resource Strain: Not on file  Food Insecurity: Not on file  Transportation Needs: Not on file  Physical Activity: Not on file  Stress: Not on file  Social Connections: Not on file  Intimate Partner Violence: Not on file    Objective:   Today's  Vitals: BP 130/69   Pulse 73   Ht 5\' 7"  (1.702 m)   Wt (!) 350 lb (158.8 kg)   LMP 07/28/2013 Comment: perimenopausal  SpO2 98%   BMI 54.82 kg/m   Physical Exam  60 year old female in no acute distress Cardiovascular exam with regular rate and rhythm Lungs clear to auscultation bilaterally  Assessment & Plan:   Problem List Items Addressed This Visit       Cardiovascular and Mediastinum   Essential hypertension (Chronic)    Recommend continued regular follow-up with cardiology.  No medication changes today Recommend intermittent monitoring of blood pressure at home Recommend DASH diet        Respiratory   COPD (chronic obstructive pulmonary disease) (HCC)    Continue with current inhalers as prescribed by pulmonology, continue with close  follow-up with pulmonologist for monitoring and adjustment of medications as needed        Endocrine   Diabetes mellitus type 2 in obese (HCC) - Primary (Chronic)    Inconsistent control of blood sugar over the years, emphasized importance of regular follow-ups with endocrinology and adherence to medication and treatment plan      Relevant Orders   Ambulatory referral to Ophthalmology   NPDR (nonproliferative diabetic retinopathy) (HCC)    History of diabetic retinopathy, needs follow-up with ophthalmology, referral placed today      Relevant Orders   Ambulatory referral to Ophthalmology    Outpatient Encounter Medications as of 05/28/2021  Medication Sig   acetaminophen (TYLENOL) 500 MG tablet Take 500 mg by mouth every 6 (six) hours as needed for moderate pain or headache.   ANORO ELLIPTA 62.5-25 MCG/INH AEPB INHALE 1 PUFF INTO THE LUNGS DAILY.   ascorbic acid (VITAMIN C) 500 MG tablet TAKE ONE TABLET BY MOUTH ONCE DAILY   ASPIRIN LOW DOSE 81 MG EC tablet CHEW 1 TABLET (81 MG TOTAL) BY MOUTH DAILY.   BIDIL 20-37.5 MG tablet TAKE 1 TABLET BY MOUTH 3 (THREE) TIMES DAILY.   bisoprolol (ZEBETA) 5 MG tablet TAKE 1 TABLET (5 MG  TOTAL) BY MOUTH DAILY.   Blood Glucose Monitoring Suppl (ACCU-CHEK GUIDE) w/Device KIT Use accu chek guide to check blood sugar three times daily.   Continuous Blood Gluc Receiver (FREESTYLE LIBRE 2 READER) DEVI Use to check blood sugar daily.   FEROSUL 325 (65 Fe) MG tablet TAKE 1 TABLET (325 MG TOTAL) BY MOUTH DAILY AFTER SUPPER. PLEASE TAKE WITH A SOURCE OF VITAMIN C   fluticasone (FLONASE) 50 MCG/ACT nasal spray PLACE 1 SPRAY INTO BOTH NOSTRILS DAILY.   hydrocerin (EUCERIN) CREA Apply 1 application topically 2 (two) times daily.   insulin degludec (TRESIBA FLEXTOUCH) 200 UNIT/ML FlexTouch Pen INJECT 80 UNITS UNDER THE SKIN ONCE DAILY   Insulin Pen Needle (EASY COMFORT PEN NEEDLES) 31G X 5 MM MISC USE 3 TIMES A DAY FOR INSULIN ADMINISTRATION   NEEDLE, DISP, 26 G 26G X 1/2" MISC 1 application by Does not apply route daily.   NOVOLOG FLEXPEN 100 UNIT/ML FlexPen Inject 20 units under the skin at breakfast and lunch, and 24 units at dinner. (Patient taking differently: Inject 20 units under the skin at breakfast and lunch, and 24 units at dinner.)   potassium chloride SA (KLOR-CON) 20 MEQ tablet TAKE 2 TABLETS (40 MEQ TOTAL) BY MOUTH DAILY. (Patient taking differently: No sig reported)   PROAIR HFA 108 (90 Base) MCG/ACT inhaler Inhale 1-2 puffs into the lungs every 4 (four) hours as needed for wheezing or shortness of breath. (Patient not taking: Reported on 07/28/2021)   RESTASIS 0.05 % ophthalmic emulsion Place 1 drop into both eyes daily as needed.    spironolactone (ALDACTONE) 25 MG tablet Take 1 tablet by mouth daily.   Tiotropium Bromide-Olodaterol (STIOLTO RESPIMAT) 2.5-2.5 MCG/ACT AERS Inhale 2 puffs into the lungs daily.   traMADol (ULTRAM) 50 MG tablet    vitamin B-12 (CYANOCOBALAMIN) 1000 MCG tablet Take 1,000 mcg by mouth daily. (Patient not taking: Reported on 08/10/2021)   Vitamin D, Ergocalciferol, (DRISDOL) 1.25 MG (50000 UNIT) CAPS capsule Take 50,000 Units by mouth once a week.  (Patient not taking: Reported on 07/28/2021)   [DISCONTINUED] ACCU-CHEK AVIVA PLUS test strip USE 1 STRIP TO CHECK BLOOD SUGAR 3 TIMES A DAY .   [DISCONTINUED] albuterol (PROVENTIL) (5 MG/ML) 0.5% nebulizer solution Take 0.5 mLs (  2.5 mg total) by nebulization every 6 (six) hours as needed for wheezing or shortness of breath.   [DISCONTINUED] Continuous Blood Gluc Sensor (FREESTYLE LIBRE 2 SENSOR) MISC 2 Devices by Does not apply route every 14 (fourteen) days.   [DISCONTINUED] ipratropium-albuterol (DUONEB) 0.5-2.5 (3) MG/3ML SOLN INHALE CONTENTS OF ONE VIAL USING NEBULIZER EVERY 6 HOURS AS NEEDED FOR SHORTNESS OF BREATH, WHEEZING   [DISCONTINUED] JARDIANCE 10 MG TABS tablet TAKE ONE TABLET BY MOUTH ONCE DAILY BEFORE BREAKFAST   [DISCONTINUED] metolazone (ZAROXOLYN) 2.5 MG tablet Take 1 tablet (2.5 mg total) by mouth once a week. APPT NEEDED FOR FURTHER REFILLS. PLEASE CALL [phone]   [DISCONTINUED] omeprazole (PRILOSEC) 40 MG capsule TAKE 1 CAPSULE (40 MG TOTAL) BY MOUTH DAILY.   [DISCONTINUED] predniSONE (DELTASONE) 10 MG tablet Take 2 tablets (20 mg total) by mouth daily with breakfast.   [DISCONTINUED] rosuvastatin (CRESTOR) 40 MG tablet TAKE 1 TABLET (40 MG TOTAL) BY MOUTH DAILY AT 6 PM.   [DISCONTINUED] sacubitril-valsartan (ENTRESTO) 24-26 MG Take 1 tablet by mouth 2 (two) times daily. Needs appt for further refills   [DISCONTINUED] Semaglutide, 1 MG/DOSE, (OZEMPIC, 1 MG/DOSE,) 2 MG/1.5ML SOPN Inject 1 mg into the skin once a week.   [DISCONTINUED] torsemide (DEMADEX) 100 MG tablet TAKE 1 TABLET (100 MG TOTAL) BY MOUTH 2 (TWO) TIMES DAILY.   [DISCONTINUED] albuterol (PROVENTIL) (2.5 MG/3ML) 0.083% nebulizer solution Take 3 mLs (2.5 mg total) by nebulization every 4 (four) hours as needed for wheezing or shortness of breath. (Patient not taking: Reported on 05/28/2021)   [DISCONTINUED] predniSONE (STERAPRED UNI-PAK 21 TAB) 10 MG (21) TBPK tablet Take by mouth daily. Take 6 tabs by mouth daily   for 1 day, then 5 tabs for 1 day, then 4 tabs for 1 day, then 3 tabs for 1 day, 2 tabs for 1 day, then 1 tab by mouth daily for 1 day (Patient not taking: Reported on 05/28/2021)   No facility-administered encounter medications on file as of 05/28/2021.   Spent 45 minutes on this patient encounter, including preparation, chart review, face-to-face counseling with patient and coordination of care, and documentation of encounter  Follow-up: Return in about 3 months (around 08/26/2021).   Braydn Carneiro J [place], MD

## 2021-06-03 ENCOUNTER — Telehealth (HOSPITAL_COMMUNITY): Payer: Self-pay

## 2021-06-03 NOTE — Telephone Encounter (Signed)
Called to confirm/remind patient of their appointment at the [facility] on 06/04/21.   Patient reminded to bring all medications and/or complete list.  Confirmed patient has transportation. Gave directions, instructed to utilize [place] parking.  Confirmed appointment prior to ending call.

## 2021-06-03 NOTE — Progress Notes (Addendum)
**Note Tamara-Identified via Obfuscation** [name], DOB 02-02-1961, MRN [id number]   Provider location: [facility] Type of Visit: Established patient   PCP:  Tamara [place], Raymond J, MD Endocrinology: Dr. Dwyane Dee  HF Cardiology: Dr Aundra Dubin   History of Present Illness: Tamara Silva is a 61 y.o. 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  She has had frequent admits for anemia and CHF with transfusions and diuresis. Prasugrel was stopped. No source of bleeding found on EGD and colonoscopy.   She is on home 02 and nocturnal BiPAP for OHS/OSA and COPD.   She no longer smokes.  Hx of PE in 2014.    Hx of bradycardia with Coreg at higher dose.  Echo 05/31/18 with EF 40-45%, PA peak pressure 71 mmHg.    Admitted 10/25/18 with SOB and edema, + productive cough.  This started over a few weeks prior to admission. Hospital course complicated by PEA arrest on 10/28/18. CPR for 25 minutes. Extubated 10/30/18.  Developed AKI.  HF team followed closely to optimize HF.  She was massively volume overloaded and extensively diuresed, weight down 65 lbs total.  Echo showed EF down to 30-35%.  No coronary angiography due to AKI.   Readmitted  11/17/18 with lower extremity edema and volume overload. Diuresed with IV lasix and transitioned to torsemide 40 mg twice daily. Discharged on 11/21/18 to home on oxygen.   She had RHC/LHC in 9/20, most significant stenosis was 75% ostial D1.  There was mild to moderate nonobstructive disease in other vessels. There was not an interventional target.  Filling pressures were mildly elevated with preserved cardiac output.  Torsemide was increased.   Echo in 10/20 showed that EF remained 30-35% with wall motion abnormalities, RV appeared normal.   Medtronic ICD placed in 11/20.   Today she returns for HF follow up. She was last seen 11/2019. She is on oxygen during the day  and wearing trilogy every night. She has mild dyspnea walking around her house, but is overall not very active. Main complaint is occasional abdominal pain x 1 week. No changes to bowel habits. Tramadol helps with the pain, takes this for her chronic back pain. Asking about a refill. Has occasional atypical chest pain when she over exerts herself. Denies abnormal bleeding, palpitations, dizziness, edema. She has chronic orthopnea. Appetite ok. No fever or chills. She does not weigh at home. She has been off of metolazone for several months.   Device interrogation (personally reviewed): OptiVol had been up for past month but now well below threshold x 1 week, thoracic impedence up, daily activity < 30 minutes, several short bursts of ? AF about 3 weeks ago, no VT.  Labs (7/20): K 3.7, creatinine 1.56 Labs (9/20): K 3.7, creatinine 1.46, LDL 52, HDL 56  Labs (12/20): K 3.8, creatinine 1.51 Labs (1/21): hgb 10.6, K 4.4, creatinine 1.91 Labs (5/21): LDL 68, HDL 56 Labs (7/21): K 4.4, creatinine 2.34, hgb 9.7 Labs (11/22): K 4.4., creatinine 2.30, hgb 9.3  PMH: 1. CVA (1/20). 2. Type 2 diabetes 3. HTN 4. H/o seizures 5. PEA arrest (5/20) with CPR.  6. COPD: Home oxygen.  Stopped smoking in 1/15.  7. CAD: PCI [facility] 2006, [facility] 2009.  - Inferior STEMI 5/15, DES to RCA.  - Poor responder to Plavix.  -  LHC (9/20): 75% ostial D1, 40-50% mLCx, 40% proximal PLV.  8. Hyperlipidemia 9. PE: 2014, LLL.  10. OHS/OSA: Home oxygen and Bipap used.  11. Chronic systolic CHF: Suspect primarily ischemic cardiomyopathy. Medtronic ICD.  - Echo (5/15): EF 35-40% - Echo (2/16): EF 40-45% - Echo (1/20): EF 40-45% - Echo (5/20): EF 30-35%, mild RV dilation.  - RHC (5/20): mean RA 9, PA 62/22 mean 37, mean PCWP 15, CI 4.29, PVR 2.1 WU.  - RHC (9/20): mean RA 11, PA 66/18 mean 37, mean PCWP 19, CI 3.12, PVR 2.4 WU - Echo (10/20): EF 30-35% with wall motion abnormalities, normal RV.  12. CKD: Stage 3.   13. Fe deficiency anemia: EGD and colonoscopy without definite source of bleeding.   ROS: All systems negative except as listed in HPI, PMH and Problem List.  SH:  Social History   Socioeconomic History   Marital status: Single    Spouse name: Not on file   Number of children: 1   Years of education: Not on file   Highest education level: Not on file  Occupational History   Not on file  Tobacco Use   Smoking status: Former    Packs/day: 0.50    Years: 20.00    Pack years: 10.00    Types: Cigarettes    Quit date: 05/31/2018    Years since quitting: 3.0   Smokeless tobacco: Never   Tobacco comments:    smoked off and on x 20 years  Vaping Use   Vaping Use: Never used  Substance and Sexual Activity   Alcohol use: No    Alcohol/week: 0.0 standard drinks   Drug use: Not Currently    Types: Cocaine   Sexual activity: Not Currently  Other Topics Concern   Not on file  Social History Narrative   Origibnally from [place]   Most recently from [place]   Daughter lives in town      On Social security to CHF, COPD, CAD   Social Determinants of Health   Financial Resource Strain: Not on file  Food Insecurity: Not on file  Transportation Needs: Not on file  Physical Activity: Not on file  Stress: Not on file  Social Connections: Not on file  Intimate Partner Violence: Not on file   FH:  Family History  Problem Relation Age of Onset   Stroke Mother    Hypertension Mother    Colon cancer Father    Diabetes Father    Heart attack Father    Sarcoidosis Other    Breast cancer Paternal Aunt    Emphysema Brother    Lung disease Brother        Unknown type, 3 brothers, one with liver and lung disease   Diabetes Brother    Asthma Paternal Aunt    Diabetes Sister     Current Outpatient Medications  Medication Sig Dispense Refill   ACCU-CHEK AVIVA PLUS test strip USE 1 STRIP TO CHECK BLOOD SUGAR 3 TIMES A DAY . 100 each 0   acetaminophen (TYLENOL) 500 MG tablet Take  500 mg by mouth every 6 (six) hours as needed for moderate pain or headache.     albuterol (PROVENTIL) (5 MG/ML) 0.5% nebulizer solution Take 0.5 mLs (2.5 mg total) by nebulization every 6 (six) hours as needed for wheezing or shortness of breath. 20 mL 0   ANORO ELLIPTA 62.5-25 MCG/INH AEPB INHALE 1 PUFF INTO THE LUNGS DAILY. 60 each 5   ascorbic acid (VITAMIN C)  500 MG tablet TAKE ONE TABLET BY MOUTH ONCE DAILY 90 tablet 3   ASPIRIN LOW DOSE 81 MG EC tablet CHEW 1 TABLET (81 MG TOTAL) BY MOUTH DAILY. 90 tablet 3   BIDIL 20-37.5 MG tablet TAKE 1 TABLET BY MOUTH 3 (THREE) TIMES DAILY. 270 tablet 3   bisoprolol (ZEBETA) 5 MG tablet TAKE 1 TABLET (5 MG TOTAL) BY MOUTH DAILY. 30 tablet 6   Blood Glucose Monitoring Suppl (ACCU-CHEK GUIDE) w/Device KIT Use accu chek guide to check blood sugar three times daily. 1 kit 0   Continuous Blood Gluc Receiver (FREESTYLE LIBRE 2 READER) DEVI Use to check blood sugar daily. 1 each 0   Continuous Blood Gluc Sensor (FREESTYLE LIBRE 2 SENSOR) MISC 2 Devices by Does not apply route every 14 (fourteen) days. 2 each 3   FEROSUL 325 (65 Fe) MG tablet TAKE 1 TABLET (325 MG TOTAL) BY MOUTH DAILY AFTER SUPPER. PLEASE TAKE WITH A SOURCE OF VITAMIN C 90 tablet 3   fluticasone (FLONASE) 50 MCG/ACT nasal spray PLACE 1 SPRAY INTO BOTH NOSTRILS DAILY. 16 g 1   hydrocerin (EUCERIN) CREA Apply 1 application topically 2 (two) times daily.  0   insulin degludec (TRESIBA FLEXTOUCH) 200 UNIT/ML FlexTouch Pen INJECT 80 UNITS UNDER THE SKIN ONCE DAILY 18 mL 3   Insulin Pen Needle (EASY COMFORT PEN NEEDLES) 31G X 5 MM MISC USE 3 TIMES A DAY FOR INSULIN ADMINISTRATION 100 each 12   ipratropium-albuterol (DUONEB) 0.5-2.5 (3) MG/3ML SOLN INHALE CONTENTS OF ONE VIAL USING NEBULIZER EVERY 6 HOURS AS NEEDED FOR SHORTNESS OF BREATH, WHEEZING 360 mL 11   JARDIANCE 10 MG TABS tablet TAKE ONE TABLET BY MOUTH ONCE DAILY BEFORE BREAKFAST 30 tablet 1   metolazone (ZAROXOLYN) 2.5 MG tablet Take 1  tablet (2.5 mg total) by mouth once a week. APPT NEEDED FOR FURTHER REFILLS. PLEASE CALL [phone] 4 tablet 0   NEEDLE, DISP, 26 G 26G X 1/2" MISC 1 application by Does not apply route daily. 100 each 0   NOVOLOG FLEXPEN 100 UNIT/ML FlexPen Inject 20 units under the skin at breakfast and lunch, and 24 units at dinner. 30 mL 4   omeprazole (PRILOSEC) 40 MG capsule TAKE 1 CAPSULE (40 MG TOTAL) BY MOUTH DAILY. 90 capsule 1   potassium chloride SA (KLOR-CON) 20 MEQ tablet TAKE 2 TABLETS (40 MEQ TOTAL) BY MOUTH DAILY. 90 tablet 1   PROAIR HFA 108 (90 Base) MCG/ACT inhaler Inhale 1-2 puffs into the lungs every 4 (four) hours as needed for wheezing or shortness of breath. 8.5 g 0   RESTASIS 0.05 % ophthalmic emulsion Place 1 drop into both eyes daily as needed.      rosuvastatin (CRESTOR) 40 MG tablet TAKE 1 TABLET (40 MG TOTAL) BY MOUTH DAILY AT 6 PM. 90 tablet 0   sacubitril-valsartan (ENTRESTO) 24-26 MG Take 1 tablet by mouth 2 (two) times daily. Needs appt for further refills 30 tablet 0   Semaglutide, 1 MG/DOSE, (OZEMPIC, 1 MG/DOSE,) 2 MG/1.5ML SOPN Inject 1 mg into the skin once a week. 3 mL 2   spironolactone (ALDACTONE) 25 MG tablet Take 1 tablet by mouth daily.     Tiotropium Bromide-Olodaterol (STIOLTO RESPIMAT) 2.5-2.5 MCG/ACT AERS Inhale 2 puffs into the lungs daily. 1 each 5   torsemide (DEMADEX) 100 MG tablet TAKE 1 TABLET (100 MG TOTAL) BY MOUTH 2 (TWO) TIMES DAILY. 60 tablet 0   traMADol (ULTRAM) 50 MG tablet SMARTSIG:1 Tablet(s) By Mouth Every 12 Hours PRN  vitamin B-12 (CYANOCOBALAMIN) 1000 MCG tablet Take 1,000 mcg by mouth daily.     Vitamin D, Ergocalciferol, (DRISDOL) 1.25 MG (50000 UNIT) CAPS capsule Take 50,000 Units by mouth once a week.     No current facility-administered medications for this encounter.   BP 118/62    Pulse 80    Wt (!) 152.4 kg (336 lb)    LMP 07/28/2013 Comment: perimenopausal   SpO2 95% Comment: on 3L   BMI 52.63 kg/m   Wt Readings from Last 3  Encounters:  06/04/21 (!) 152.4 kg (336 lb)  05/28/21 (!) 158.8 kg (350 lb)  04/25/21 (!) 157.4 kg (347 lb)   Physical Exam:   General:  NAD. No resp difficulty, arrived in [facility] on 2L oxygen HEENT: Normal Neck: Supple. Thick neck. Carotids 2+ bilat; no bruits. No lymphadenopathy or thryomegaly appreciated. Cor: PMI nondisplaced. Regular rate & rhythm. No rubs, gallops or murmurs. Lungs: Clear Abdomen: Obese,  nontender, nondistended. No hepatosplenomegaly. No bruits or masses. Good bowel sounds. Extremities: No cyanosis, clubbing, rash, edema Neuro: Alert & oriented x 3, cranial nerves grossly intact. Moves all 4 extremities w/o difficulty. Affect pleasant.  ASSESSMENT & PLAN: 1. H/O PEA arrest: Due to severe hypoxemia in 5/20 in setting of CHF exacerbation.   2. Chronic systolic CHF: Suspect ischemic cardiomyopathy.  Medtronic ICD.  Echo in 1/20 with EF 40-45%, RV moderately dilated, PASP 71 mmHg.  Echo in 5/20 with lower EF, 30-35%, and mildly dilated RV.  Suspect significant RV failure, may be related to OHS/OSA and COPD. She was massively volume overloaded during 5/20 admission and diuresed extensively.  RHC done 11/06/18 showed high cardiac output and minimally elevated filling pressures after diuresis.  PVR was not elevated, appeared to be high output PH.  RHC was done again in 9/20, filling pressures were elevated with preserved cardiac output.  Echo in 10/20 showed EF still 30-35%.  CHF has been complicated by cardiorenal syndrome.  Weight is stable.  She does not look volume overloaded on exam or by OptiVol.   Chronic NYHA class III symptoms.  She says she is more short of breath recently. - Update echo. - Has been off metolazone for months (previously on weekly dosing). OK to add back if needed. - Continue torsemide 100 mg bid.  BMET/BNP today. - Continue Jardiance 10 mg daily. No GU symtpoms.    - Continue Bidil 1 tab tid.   - Continue spironolactone 25 mg daily. - Continue Entresto  24/26 mg bid.  - Given significant lung disease/COPD, continue bisoprolol 5 mg daily (more beta-1 selective).  - I will ask device RN to enroll in monthly ICM for volume management. - Enroll in paramedicine to help with medications. 3. CKD: Stage III. She sees Dr. Royce Macadamia. Check renal function today. 4. COPD: On home oxygen at baseline.  Has quit smoking.  5. OHS/OSA: On home oxygen at all times and wears trilogy. She is followed by Pulmonary. 6. CAD: History of PCI, most recently had inferior STEMI in 5/15 with DES to RCA.  With fall in EF this year, coronary angiography was done in 9/20.  There was 75% ostial D1 stenosis but no good target for PCI. No chest pain. Will update echo, if EF further reduced, would likely not pursue cath with CKD, unless in ACS. - Continue ASA 81 and Crestor 40 daily. Check lipids next visit.    7. Remote PE: She has not been anticoagulated.  8. Anemia: History of GI bleeding, had endoscopies  earlier in 2021 with no bleeding source found.  Also with history of B12 deficiency. She has seen Dr. Lorenso Courier for this. - CBC today. 9. Pulmonary hypertension: Pulmonary venous hypertension on last RHC in 9/20.  10. Obesity: Body mass index is 52.63 kg/m. We discussed diet and exercise.  - She is now on semaglutide. 11. Abdominal Pain: Present x 1 week, radiates to low back. No changes to bowel habits, no GU symptoms or recent trauma. She says pain feels like her chronic low back pain. Will need to see prescribing provider for further tramadol refills. - Will check LFTs. Advised further follow up with PCP. 12. Medication compliance: Endorsed missing several doses of medications weekly.  - Arrange paramedicine as above.   Follow up with APP in 6 weeks (check lipids) and with Dr. Aundra Dubin in 12 weeks.  Signed, Rafael Bihari, FNP  06/04/2021  [facility] [address] [facility] [place] [address] [phone]  (office) [phone] (fax)

## 2021-06-04 ENCOUNTER — Ambulatory Visit (HOSPITAL_COMMUNITY)
Admission: RE | Admit: 2021-06-04 | Discharge: 2021-06-04 | Disposition: A | Discharge status: Home / Self Care | Payer: Medicaid Other | Source: Ambulatory Visit | Attending: Family Medicine | Admitting: Family Medicine

## 2021-06-04 ENCOUNTER — Other Ambulatory Visit: Payer: Self-pay

## 2021-06-04 ENCOUNTER — Encounter (HOSPITAL_COMMUNITY): Payer: Self-pay

## 2021-06-04 VITALS — BP 118/62 | HR 80 | Wt 336.0 lb

## 2021-06-04 DIAGNOSIS — R109 Unspecified abdominal pain: Secondary | ICD-10-CM | POA: Diagnosis not present

## 2021-06-04 DIAGNOSIS — Z87891 Personal history of nicotine dependence: Secondary | ICD-10-CM | POA: Diagnosis not present

## 2021-06-04 DIAGNOSIS — I469 Cardiac arrest, cause unspecified: Secondary | ICD-10-CM

## 2021-06-04 DIAGNOSIS — N183 Chronic kidney disease, stage 3 unspecified: Secondary | ICD-10-CM | POA: Insufficient documentation

## 2021-06-04 DIAGNOSIS — Z86711 Personal history of pulmonary embolism: Secondary | ICD-10-CM | POA: Diagnosis not present

## 2021-06-04 DIAGNOSIS — M545 Low back pain, unspecified: Secondary | ICD-10-CM | POA: Diagnosis not present

## 2021-06-04 DIAGNOSIS — G4733 Obstructive sleep apnea (adult) (pediatric): Secondary | ICD-10-CM | POA: Diagnosis not present

## 2021-06-04 DIAGNOSIS — Z9981 Dependence on supplemental oxygen: Secondary | ICD-10-CM | POA: Insufficient documentation

## 2021-06-04 DIAGNOSIS — I130 Hypertensive heart and chronic kidney disease with heart failure and stage 1 through stage 4 chronic kidney disease, or unspecified chronic kidney disease: Secondary | ICD-10-CM | POA: Diagnosis not present

## 2021-06-04 DIAGNOSIS — I252 Old myocardial infarction: Secondary | ICD-10-CM | POA: Diagnosis not present

## 2021-06-04 DIAGNOSIS — E785 Hyperlipidemia, unspecified: Secondary | ICD-10-CM | POA: Insufficient documentation

## 2021-06-04 DIAGNOSIS — G8929 Other chronic pain: Secondary | ICD-10-CM | POA: Diagnosis not present

## 2021-06-04 DIAGNOSIS — Z8673 Personal history of transient ischemic attack (TIA), and cerebral infarction without residual deficits: Secondary | ICD-10-CM | POA: Insufficient documentation

## 2021-06-04 DIAGNOSIS — Z09 Encounter for follow-up examination after completed treatment for conditions other than malignant neoplasm: Secondary | ICD-10-CM | POA: Diagnosis not present

## 2021-06-04 DIAGNOSIS — E1122 Type 2 diabetes mellitus with diabetic chronic kidney disease: Secondary | ICD-10-CM | POA: Insufficient documentation

## 2021-06-04 DIAGNOSIS — R1010 Upper abdominal pain, unspecified: Secondary | ICD-10-CM

## 2021-06-04 DIAGNOSIS — I2510 Atherosclerotic heart disease of native coronary artery without angina pectoris: Secondary | ICD-10-CM | POA: Diagnosis not present

## 2021-06-04 DIAGNOSIS — J449 Chronic obstructive pulmonary disease, unspecified: Secondary | ICD-10-CM | POA: Diagnosis not present

## 2021-06-04 DIAGNOSIS — I5022 Chronic systolic (congestive) heart failure: Secondary | ICD-10-CM | POA: Diagnosis not present

## 2021-06-04 DIAGNOSIS — R0789 Other chest pain: Secondary | ICD-10-CM | POA: Diagnosis present

## 2021-06-04 DIAGNOSIS — Z6841 Body Mass Index (BMI) 40.0 and over, adult: Secondary | ICD-10-CM | POA: Insufficient documentation

## 2021-06-04 DIAGNOSIS — D649 Anemia, unspecified: Secondary | ICD-10-CM | POA: Insufficient documentation

## 2021-06-04 DIAGNOSIS — E669 Obesity, unspecified: Secondary | ICD-10-CM | POA: Insufficient documentation

## 2021-06-04 DIAGNOSIS — Z95810 Presence of automatic (implantable) cardiac defibrillator: Secondary | ICD-10-CM | POA: Insufficient documentation

## 2021-06-04 DIAGNOSIS — I272 Pulmonary hypertension, unspecified: Secondary | ICD-10-CM | POA: Insufficient documentation

## 2021-06-04 DIAGNOSIS — Z7984 Long term (current) use of oral hypoglycemic drugs: Secondary | ICD-10-CM | POA: Insufficient documentation

## 2021-06-04 DIAGNOSIS — Z8674 Personal history of sudden cardiac arrest: Secondary | ICD-10-CM | POA: Diagnosis not present

## 2021-06-04 DIAGNOSIS — Z955 Presence of coronary angioplasty implant and graft: Secondary | ICD-10-CM | POA: Insufficient documentation

## 2021-06-04 DIAGNOSIS — Z79899 Other long term (current) drug therapy: Secondary | ICD-10-CM | POA: Diagnosis not present

## 2021-06-04 LAB — COMPREHENSIVE METABOLIC PANEL
ALT: 14 U/L (ref 0–44)
AST: 14 U/L — ABNORMAL LOW (ref 15–41)
Albumin: 3.7 g/dL (ref 3.5–5.0)
Alkaline Phosphatase: 50 U/L (ref 38–126)
Anion gap: 12 (ref 5–15)
BUN: 33 mg/dL — ABNORMAL HIGH (ref 8–23)
CO2: 31 mmol/L (ref 22–32)
Calcium: 9.3 mg/dL (ref 8.9–10.3)
Chloride: 95 mmol/L — ABNORMAL LOW (ref 98–111)
Creatinine, Ser: 2.32 mg/dL — ABNORMAL HIGH (ref 0.44–1.00)
GFR, Estimated: 23 mL/min — ABNORMAL LOW (ref >60)
Glucose, Bld: 112 mg/dL — ABNORMAL HIGH (ref 70–99)
Potassium: 4 mmol/L (ref 3.5–5.1)
Sodium: 138 mmol/L (ref 135–145)
Total Bilirubin: 0.5 mg/dL (ref 0.3–1.2)
Total Protein: 8.3 g/dL — ABNORMAL HIGH (ref 6.5–8.1)

## 2021-06-04 LAB — CBC
HCT: 34.1 % — ABNORMAL LOW (ref 36.0–46.0)
Hemoglobin: 10.2 g/dL — ABNORMAL LOW (ref 12.0–15.0)
MCH: 24.9 pg — ABNORMAL LOW (ref 26.0–34.0)
MCHC: 29.9 g/dL — ABNORMAL LOW (ref 30.0–36.0)
MCV: 83.4 fL (ref 80.0–100.0)
Platelets: 341 10*3/uL (ref 150–400)
RBC: 4.09 MIL/uL (ref 3.87–5.11)
RDW: 17 % — ABNORMAL HIGH (ref 11.5–15.5)
WBC: 11.1 10*3/uL — ABNORMAL HIGH (ref 4.0–10.5)
nRBC: 0 % (ref 0.0–0.2)

## 2021-06-04 LAB — BRAIN NATRIURETIC PEPTIDE: B Natriuretic Peptide: 33 pg/mL (ref 0.0–100.0)

## 2021-06-04 NOTE — Patient Instructions (Addendum)
Thank you for coming in today  Labs were done today, if any labs are abnormal the clinic will call you  STOP Metolazone   You will be referred to [place] our clinic social worker will call you   Your physician recommends that you schedule a follow-up appointment in:  6 weeks in clinic  12 weeks with Dr. Aundra Dubin  At the [facility], you and your health needs are our priority. As part of our continuing mission to provide you with exceptional heart care, we have created designated Provider Care Teams. These Care Teams include your primary Cardiologist (physician) and Advanced Practice Providers (APPs- Physician Assistants and Nurse Practitioners) who all work together to provide you with the care you need, when you need it.   You may see any of the following providers on your designated Care Team at your next follow up: Dr Glori Bickers Dr Haynes Kerns, NP Lyda Jester, [place] [facility] [place], [place] Audry Riles, PharmD   Please be sure to bring in all your medications bottles to every appointment.   If you have any questions or concerns before your next appointment please send [place] a message through [place] or call our office at [phone].    TO LEAVE A MESSAGE FOR THE NURSE SELECT OPTION 2, PLEASE LEAVE A MESSAGE INCLUDING: YOUR NAME DATE OF BIRTH CALL BACK NUMBER REASON FOR CALL**this is important as we prioritize the call backs  YOU WILL RECEIVE A CALL BACK THE SAME DAY AS LONG AS YOU CALL BEFORE 4:00 PM

## 2021-06-05 ENCOUNTER — Other Ambulatory Visit: Payer: Self-pay | Admitting: Endocrinology

## 2021-06-08 NOTE — Addendum Note (Signed)
Encounter addended by: Jorge Ny, LCSW on: 06/08/2021 10:32 AM  Actions taken: Clinical Note Signed

## 2021-06-08 NOTE — Progress Notes (Signed)
CSW consulted to enroll pt in [employer].  Pt has been enrolled in the past and is agreeable to having them come back out at this time.  No immediate concerns expressed.  CSW arranged [facility] to take patient back home.  Will continue to follow and assist as needed  Jorge Ny, [facility] Desk#: [phone] Cell#: [phone]

## 2021-06-10 ENCOUNTER — Other Ambulatory Visit (HOSPITAL_COMMUNITY): Payer: Self-pay

## 2021-06-10 ENCOUNTER — Other Ambulatory Visit (HOSPITAL_COMMUNITY): Payer: Self-pay | Admitting: Cardiology

## 2021-06-10 NOTE — Progress Notes (Signed)
Paramedicine Encounter    Patient ID: Tamara Silva, female    DOB: [birth year], 61 y.o.   MRN: [id number]  Initial home visit with pt. She is lying in the bed, c/o hip pain.  She gets her meds from [place]. Will ask to see if she can get her co-pays waived.    [facility] [employer] She has tried to use medicaid txp but it didn't work out.   Meds reviewed- She doesn't have the batteries for the glucometer.  She does have the freestyle meter and is using that.  Does not have the anoro ellipta inhaler.  Taking the 331m daily instead of the 868m  She reports potassium she is taking it 3X a week-not like the epic chart of 2 tablets daily-LF 05/20/21 Dr foReeves Damkidney doc cut her back to potassium 3x a week.  She has torsemide bottle from 10/22-she reports compliance with it though.    She reports dr kuDwyane Deencreased her novolog to 30U TID.   She reports her breathing is doing ok.   Pt is wanting to speak to someone regarding some counseling. [place]   BP (!) 126/42    Pulse 78    Resp 18    LMP 07/28/2013 Comment: perimenopausal   SpO2 98%  CBG -123  Weight yesterday-? Last visit weight-336  Patient Care Team: de CuGuamRaBlondell RevealMD as PCP - General (Family Medicine) McLarey DresserMD as PCP - Advanced Heart Failure (Cardiology) CaConstance HawMD as PCP - Electrophysiology (Cardiology) TuSueanne MargaritaMD as PCP - Cardiology (Cardiology)  Patient Active Problem List   Diagnosis Date Noted   NPDR (nonproliferative diabetic retinopathy) (HCGarrard03/01/2021   Anemia of chronic disease 01/25/2020   Coronary artery disease involving native coronary artery of native heart with angina pectoris (HCCanistota0662/83/1517 Chronic systolic (congestive) heart failure (HCLaclede11/08/2018   Diabetic peripheral neuropathy (HCRowlesburg08/21/2020   Former smoker 01/19/2019   CKD (chronic kidney disease) stage 3, GFR 30-59 ml/min (HCEdmonson08/21/2020   Cardiac arrest  (HCColumbine05/30/2020   Noncompliance with treatment plan 07/24/2018   General patient noncompliance 07/24/2018   Noncompliance with diet and medication regimen 07/24/2018   Dysarthria    BiPAP (biphasic positive airway pressure) dependence    Type II diabetes mellitus with renal manifestations (HCMount Joy02/12/2018   Hepatic steatosis 06/22/2018   Chronic respiratory failure with hypoxia and hypercapnia (HCSilver City   On home O2    Slow transit constipation    Cocaine abuse (HCBay Minette   Diabetes mellitus type 2 in obese (HCSouth Fulton   Cerebral embolism with cerebral infarction 06/01/2018   Pressure injury of skin 05/30/2018   Iron deficiency anemia 01/27/2018   Tobacco abuse disorder 10/03/2017   Dyslipidemia 04/29/2016   Primary insomnia 04/29/2016   Obesity hypoventilation syndrome (HCC)    OSA (obstructive sleep apnea)    Dyslipidemia associated with type 2 diabetes mellitus (HCRivereno12/21/2016   COPD GOLD III  06/10/2014   CAD S/P percutaneous coronary angioplasty - prior PCI to LAD; RCA PCI: new Xience Alpine DES 2.75 mm x 15 mm  10/07/2013   Morbid obesity (HCLewes03/07/2013   Chronic combined systolic and diastolic heart failure (HCAllisonia02/02/2014   Bipolar disorder (HCAviston09/03/2013   Essential hypertension 02/08/2013   COPD (chronic obstructive pulmonary disease) (HCCoalton05/18/2014    Current Outpatient Medications:    ACCU-CHEK AVIVA PLUS test strip, USE 1 STRIP TO CHECK BLOOD SUGAR 3  TIMES A DAY ., Disp: 100 each, Rfl: 0   acetaminophen (TYLENOL) 500 MG tablet, Take 500 mg by mouth every 6 (six) hours as needed for moderate pain or headache., Disp: , Rfl:    albuterol (PROVENTIL) (5 MG/ML) 0.5% nebulizer solution, Take 0.5 mLs (2.5 mg total) by nebulization every 6 (six) hours as needed for wheezing or shortness of breath., Disp: 20 mL, Rfl: 0   ascorbic acid (VITAMIN C) 500 MG tablet, TAKE ONE TABLET BY MOUTH ONCE DAILY, Disp: 90 tablet, Rfl: 3   ASPIRIN LOW DOSE 81 MG  EC tablet, CHEW 1 TABLET (81 MG TOTAL) BY MOUTH DAILY. (Patient taking differently: No sig reported), Disp: 90 tablet, Rfl: 3   BIDIL 20-37.5 MG tablet, TAKE 1 TABLET BY MOUTH 3 (THREE) TIMES DAILY., Disp: 270 tablet, Rfl: 3   bisoprolol (ZEBETA) 5 MG tablet, TAKE 1 TABLET (5 MG TOTAL) BY MOUTH DAILY., Disp: 30 tablet, Rfl: 6   Blood Glucose Monitoring Suppl (ACCU-CHEK GUIDE) w/Device KIT, Use accu chek guide to check blood sugar three times daily., Disp: 1 kit, Rfl: 0   Continuous Blood Gluc Receiver (FREESTYLE LIBRE 2 READER) DEVI, Use to check blood sugar daily., Disp: 1 each, Rfl: 0   Continuous Blood Gluc Sensor (FREESTYLE LIBRE 2 SENSOR) MISC, 2 Devices by Does not apply route every 14 (fourteen) days., Disp: 2 each, Rfl: 3   FEROSUL 325 (65 Fe) MG tablet, TAKE 1 TABLET (325 MG TOTAL) BY MOUTH DAILY AFTER SUPPER. PLEASE TAKE WITH A SOURCE OF VITAMIN C, Disp: 90 tablet, Rfl: 3   fluticasone (FLONASE) 50 MCG/ACT nasal spray, PLACE 1 SPRAY INTO BOTH NOSTRILS DAILY., Disp: 16 g, Rfl: 1   insulin degludec (TRESIBA FLEXTOUCH) 200 UNIT/ML FlexTouch Pen, INJECT 80 UNITS UNDER THE SKIN ONCE DAILY, Disp: 18 mL, Rfl: 3   Insulin Pen Needle (EASY COMFORT PEN NEEDLES) 31G X 5 MM MISC, USE 3 TIMES A DAY FOR INSULIN ADMINISTRATION, Disp: 100 each, Rfl: 12   ipratropium-albuterol (DUONEB) 0.5-2.5 (3) MG/3ML SOLN, INHALE CONTENTS OF ONE VIAL USING NEBULIZER EVERY 6 HOURS AS NEEDED FOR SHORTNESS OF BREATH, WHEEZING, Disp: 360 mL, Rfl: 11   JARDIANCE 10 MG TABS tablet, TAKE ONE TABLET BY MOUTH ONCE DAILY BEFORE BREAKFAST, Disp: 30 tablet, Rfl: 1   NEEDLE, DISP, 26 G 26G X 1/2" MISC, 1 application by Does not apply route daily., Disp: 100 each, Rfl: 0   NOVOLOG FLEXPEN 100 UNIT/ML FlexPen, Inject 20 units under the skin at breakfast and lunch, and 24 units at dinner. (Patient taking differently: Inject 20 units under the skin at breakfast and lunch, and 24 units at dinner.), Disp: 30 mL, Rfl: 4    omeprazole (PRILOSEC) 40 MG capsule, TAKE 1 CAPSULE (40 MG TOTAL) BY MOUTH DAILY., Disp: 90 capsule, Rfl: 1   OZEMPIC, 1 MG/DOSE, 4 MG/3ML SOPN, INJECT 1 MG INTO THE SKIN ONCE A WEEK, Disp: 3 mL, Rfl: 1   potassium chloride SA (KLOR-CON) 20 MEQ tablet, TAKE 2 TABLETS (40 MEQ TOTAL) BY MOUTH DAILY., Disp: 90 tablet, Rfl: 1   PROAIR HFA 108 (90 Base) MCG/ACT inhaler, Inhale 1-2 puffs into the lungs every 4 (four) hours as needed for wheezing or shortness of breath., Disp: 8.5 g, Rfl: 0   RESTASIS 0.05 % ophthalmic emulsion, Place 1 drop into both eyes daily as needed. , Disp: , Rfl:    rosuvastatin (CRESTOR) 40 MG tablet, TAKE 1 TABLET (40 MG TOTAL) BY MOUTH DAILY AT 6 PM., Disp: 90 tablet,  Rfl: 0   sacubitril-valsartan (ENTRESTO) 24-26 MG, Take 1 tablet by mouth 2 (two) times daily. Needs appt for further refills, Disp: 30 tablet, Rfl: 0   Semaglutide, 1 MG/DOSE, (OZEMPIC, 1 MG/DOSE,) 2 MG/1.5ML SOPN, Inject 1 mg into the skin once a week., Disp: 3 mL, Rfl: 2   spironolactone (ALDACTONE) 25 MG tablet, Take 1 tablet by mouth daily., Disp: , Rfl:    torsemide (DEMADEX) 100 MG tablet, TAKE 1 TABLET (100 MG TOTAL) BY MOUTH 2 (TWO) TIMES DAILY., Disp: 60 tablet, Rfl: 0   traMADol (ULTRAM) 50 MG tablet, SMARTSIG:1 Tablet(s) By Mouth Every 12 Hours PRN, Disp: , Rfl:    vitamin B-12 (CYANOCOBALAMIN) 1000 MCG tablet, Take 1,000 mcg by mouth daily., Disp: , Rfl:    Vitamin D, Ergocalciferol, (DRISDOL) 1.25 MG (50000 UNIT) CAPS capsule, Take 50,000 Units by mouth once a week., Disp: , Rfl:    ANORO ELLIPTA 62.5-25 MCG/INH AEPB, INHALE 1 PUFF INTO THE LUNGS DAILY. (Patient not taking: Reported on 06/10/2021), Disp: 60 each, Rfl: 5   hydrocerin (EUCERIN) CREA, Apply 1 application topically 2 (two) times daily. (Patient not taking: Reported on 06/10/2021), Disp: , Rfl: 0   Tiotropium Bromide-Olodaterol (STIOLTO RESPIMAT) 2.5-2.5 MCG/ACT AERS, Inhale 2 puffs into the lungs daily. (Patient not taking:  Reported on 06/10/2021), Disp: 1 each, Rfl: 5 Allergies  Allergen Reactions   Metolazone Other (See Comments)    Dizziness and falling Per patient in 2021, states she does not think she fell because of this medication.   Plavix [Clopidogrel Bisulfate] Other (See Comments)    High PRU's - non-responder   Ibuprofen Other (See Comments)    Pt states she is not supposed to take ibuprofen because of other meds she is taking   Nsaids Other (See Comments)    "I do not take NSAIDs d/t interfering with other meds"      Social History   Socioeconomic History   Marital status: Single    Spouse name: Not on file   Number of children: 1   Years of education: Not on file   Highest education level: Not on file  Occupational History   Not on file  Tobacco Use   Smoking status: Former    Packs/day: 0.50    Years: 20.00    Pack years: 10.00    Types: Cigarettes    Quit date: 05/31/2018    Years since quitting: 3.0   Smokeless tobacco: Never   Tobacco comments:    smoked off and on x 20 years  Vaping Use   Vaping Use: Never used  Substance and Sexual Activity   Alcohol use: No    Alcohol/week: 0.0 standard drinks   Drug use: Not Currently    Types: Cocaine   Sexual activity: Not Currently  Other Topics Concern   Not on file  Social History Narrative   Origibnally from [place]   Most recently from [place]   Daughter lives in town      On Social security to CHF, COPD, CAD   Social Determinants of Health   Financial Resource Strain: Not on file  Food Insecurity: Not on file  Transportation Needs: Not on file  Physical Activity: Not on file  Stress: Not on file  Social Connections: Not on file  Intimate Partner Violence: Not on file    Physical Exam      Future Appointments  Date Time Provider [place]  07/21/2021 10:00 AM [facility] DEVICE REMOTES [facility]  07/28/2021  2:00 PM MC ECHO OP 1 [facility]OL[facility]  07/28/2021  3:00 PM  MC-HVSC PA/NP MC-HVSC None  08/26/2021  2:10 PM de [place], Raymond J, MD DWB-DPC DWB  09/02/2021 10:40 AM Larey Dresser, MD MC-HVSC None  10/20/2021 10:00 AM [facility] DEVICE REMOTES [facility]  01/19/2022 10:00 AM [facility] DEVICE REMOTES [facility]  04/20/2022 10:00 AM [facility] DEVICE REMOTES [facility]       Marylouise Stacks, EMT-Paramedic [place] Paramedic  06/10/21

## 2021-06-11 ENCOUNTER — Telehealth (HOSPITAL_COMMUNITY): Payer: Self-pay | Admitting: Licensed Clinical Social Worker

## 2021-06-11 NOTE — Telephone Encounter (Signed)
CSW informed by [employer] that pt is requesting resources regarding counseling for PTSD and grieving.  CSW attempted to reach out to pt to discuss- unable to reach-left VM requesting return call  Tamara Silva, [facility] Desk#: [phone] Cell#: [phone]

## 2021-06-17 ENCOUNTER — Other Ambulatory Visit (HOSPITAL_COMMUNITY): Payer: Self-pay

## 2021-06-17 NOTE — Progress Notes (Addendum)
Paramedicine Encounter    Patient ID: Tamara Silva, female    DOB: 1960/09/27, 61 y.o.   MRN: [id number]  Pt reports she has been doing ok. She denies increased sob, no dizziness, no c/p, no falls.  Advised her that jenna tried to contact her and advised her to check her VM's to return her call if she still is interested.   She is out of omeprazole,  anoro inhaler-waiting on doc for refills.  Still not gotten the $RemoveBe'81mg'sSGNaZjWv$  asa-she said she is going to try to get rx for it, I will ask [place] to bring her some out.   She is not able to go to foot doc tomor-with holiday Monday she didn't have time to sch the ride for it she will call to get it resch.  She doesn't take the 2nd dose of torsemide due to her staying up all night peeing-she has tried taking it around 330pm and it still keeps her up all night.  Will let providers know of this.  She has not been weighing at home due to no scales. I gave her some today and advised for her to weigh daily.    BP (!) 108/52    Pulse 72    Resp 18    Wt (!) 337 lb (152.9 kg)    LMP 07/28/2013 Comment: perimenopausal   SpO2 98%    BMI 52.78 kg/m  Weight yesterday-? Last visit weight-336  Patient Care Team: de [place], Blondell Reveal, MD as PCP - General (Family Medicine) Larey Dresser, MD as PCP - Advanced Heart Failure (Cardiology) Constance Haw, MD as PCP - Electrophysiology (Cardiology) Sueanne Margarita, MD as PCP - Cardiology (Cardiology)  Patient Active Problem List   Diagnosis Date Noted   NPDR (nonproliferative diabetic retinopathy) ([place]) 08/06/2020   Anemia of chronic disease 01/25/2020   Coronary artery disease involving native coronary artery of native heart with angina pectoris ([place]) 75/43/6067   Chronic systolic (congestive) heart failure ([place]) 04/04/2019   Diabetic peripheral neuropathy ([place]) 01/19/2019   Former smoker 01/19/2019   CKD (chronic kidney disease) stage 3, GFR 30-59 ml/min ([place]) 01/19/2019   Cardiac arrest ([place])  10/28/2018   Noncompliance with treatment plan 07/24/2018   General patient noncompliance 07/24/2018   Noncompliance with diet and medication regimen 07/24/2018   Dysarthria    BiPAP (biphasic positive airway pressure) dependence    Type II diabetes mellitus with renal manifestations ([place]) 07/08/2018   Hepatic steatosis 06/22/2018   Chronic respiratory failure with hypoxia and hypercapnia ([place])    On home O2    Slow transit constipation    Cocaine abuse ([place])    Diabetes mellitus type 2 in obese ([place])    Cerebral embolism with cerebral infarction 06/01/2018   Pressure injury of skin 05/30/2018   Iron deficiency anemia 01/27/2018   Tobacco abuse disorder 10/03/2017   Dyslipidemia 04/29/2016   Primary insomnia 04/29/2016   Obesity hypoventilation syndrome (HCC)    OSA (obstructive sleep apnea)    Dyslipidemia associated with type 2 diabetes mellitus ([place]) 05/21/2015   COPD GOLD III  06/10/2014   CAD S/P percutaneous coronary angioplasty - prior PCI to LAD; RCA PCI: new Xience Alpine DES 2.75 mm x 15 mm  10/07/2013   Morbid obesity ([place]) 07/30/2013   Chronic combined systolic and diastolic heart failure ([place]) 07/10/2013   Bipolar disorder ([place]) 02/08/2013   Essential hypertension 02/08/2013   COPD (chronic obstructive pulmonary disease) ([place]) 10/15/2012    Current Outpatient Medications:  ACCU-CHEK AVIVA PLUS test strip, USE 1 STRIP TO CHECK BLOOD SUGAR 3 TIMES A DAY ., Disp: 100 each, Rfl: 0   acetaminophen (TYLENOL) 500 MG tablet, Take 500 mg by mouth every 6 (six) hours as needed for moderate pain or headache., Disp: , Rfl:    albuterol (PROVENTIL) (5 MG/ML) 0.5% nebulizer solution, Take 0.5 mLs (2.5 mg total) by nebulization every 6 (six) hours as needed for wheezing or shortness of breath., Disp: 20 mL, Rfl: 0   ANORO ELLIPTA 62.5-25 MCG/INH AEPB, INHALE 1 PUFF INTO THE LUNGS DAILY. (Patient not taking: Reported on 06/10/2021), Disp: 60 each, Rfl: 5   ascorbic acid (VITAMIN C)  500 MG tablet, TAKE ONE TABLET BY MOUTH ONCE DAILY, Disp: 90 tablet, Rfl: 3   ASPIRIN LOW DOSE 81 MG EC tablet, CHEW 1 TABLET (81 MG TOTAL) BY MOUTH DAILY. (Patient taking differently: No sig reported), Disp: 90 tablet, Rfl: 3   BIDIL 20-37.5 MG tablet, TAKE 1 TABLET BY MOUTH 3 (THREE) TIMES DAILY., Disp: 270 tablet, Rfl: 3   bisoprolol (ZEBETA) 5 MG tablet, TAKE 1 TABLET (5 MG TOTAL) BY MOUTH DAILY., Disp: 30 tablet, Rfl: 6   Blood Glucose Monitoring Suppl (ACCU-CHEK GUIDE) w/Device KIT, Use accu chek guide to check blood sugar three times daily., Disp: 1 kit, Rfl: 0   Continuous Blood Gluc Receiver (FREESTYLE LIBRE 2 READER) DEVI, Use to check blood sugar daily., Disp: 1 each, Rfl: 0   Continuous Blood Gluc Sensor (FREESTYLE LIBRE 2 SENSOR) MISC, 2 Devices by Does not apply route every 14 (fourteen) days., Disp: 2 each, Rfl: 3   FEROSUL 325 (65 Fe) MG tablet, TAKE 1 TABLET (325 MG TOTAL) BY MOUTH DAILY AFTER SUPPER. PLEASE TAKE WITH A SOURCE OF VITAMIN C, Disp: 90 tablet, Rfl: 3   fluticasone (FLONASE) 50 MCG/ACT nasal spray, PLACE 1 SPRAY INTO BOTH NOSTRILS DAILY., Disp: 16 g, Rfl: 1   hydrocerin (EUCERIN) CREA, Apply 1 application topically 2 (two) times daily. (Patient not taking: Reported on 06/10/2021), Disp: , Rfl: 0   insulin degludec (TRESIBA FLEXTOUCH) 200 UNIT/ML FlexTouch Pen, INJECT 80 UNITS UNDER THE SKIN ONCE DAILY, Disp: 18 mL, Rfl: 3   Insulin Pen Needle (EASY COMFORT PEN NEEDLES) 31G X 5 MM MISC, USE 3 TIMES A DAY FOR INSULIN ADMINISTRATION, Disp: 100 each, Rfl: 12   ipratropium-albuterol (DUONEB) 0.5-2.5 (3) MG/3ML SOLN, INHALE CONTENTS OF ONE VIAL USING NEBULIZER EVERY 6 HOURS AS NEEDED FOR SHORTNESS OF BREATH, WHEEZING, Disp: 360 mL, Rfl: 11   JARDIANCE 10 MG TABS tablet, TAKE ONE TABLET BY MOUTH ONCE DAILY BEFORE BREAKFAST, Disp: 30 tablet, Rfl: 1   NEEDLE, DISP, 26 G 26G X 1/2" MISC, 1 application by Does not apply route daily., Disp: 100 each, Rfl: 0   NOVOLOG FLEXPEN 100  UNIT/ML FlexPen, Inject 20 units under the skin at breakfast and lunch, and 24 units at dinner. (Patient taking differently: Inject 20 units under the skin at breakfast and lunch, and 24 units at dinner.), Disp: 30 mL, Rfl: 4   omeprazole (PRILOSEC) 40 MG capsule, TAKE 1 CAPSULE (40 MG TOTAL) BY MOUTH DAILY., Disp: 90 capsule, Rfl: 1   OZEMPIC, 1 MG/DOSE, 4 MG/3ML SOPN, INJECT 1 MG INTO THE SKIN ONCE A WEEK, Disp: 3 mL, Rfl: 1   potassium chloride SA (KLOR-CON) 20 MEQ tablet, TAKE 2 TABLETS (40 MEQ TOTAL) BY MOUTH DAILY., Disp: 90 tablet, Rfl: 1   PROAIR HFA 108 (90 Base) MCG/ACT inhaler, Inhale 1-2 puffs into the  lungs every 4 (four) hours as needed for wheezing or shortness of breath., Disp: 8.5 g, Rfl: 0   RESTASIS 0.05 % ophthalmic emulsion, Place 1 drop into both eyes daily as needed. , Disp: , Rfl:    rosuvastatin (CRESTOR) 40 MG tablet, TAKE 1 TABLET (40 MG TOTAL) BY MOUTH DAILY AT 6 PM., Disp: 90 tablet, Rfl: 0   sacubitril-valsartan (ENTRESTO) 24-26 MG, Take 1 tablet by mouth 2 (two) times daily. Needs appt for further refills, Disp: 30 tablet, Rfl: 0   Semaglutide, 1 MG/DOSE, (OZEMPIC, 1 MG/DOSE,) 2 MG/1.5ML SOPN, Inject 1 mg into the skin once a week., Disp: 3 mL, Rfl: 2   spironolactone (ALDACTONE) 25 MG tablet, Take 1 tablet by mouth daily., Disp: , Rfl:    Tiotropium Bromide-Olodaterol (STIOLTO RESPIMAT) 2.5-2.5 MCG/ACT AERS, Inhale 2 puffs into the lungs daily. (Patient not taking: Reported on 06/10/2021), Disp: 1 each, Rfl: 5   torsemide (DEMADEX) 100 MG tablet, TAKE 1 TABLET (100 MG TOTAL) BY MOUTH 2 (TWO) TIMES DAILY., Disp: 60 tablet, Rfl: 4   traMADol (ULTRAM) 50 MG tablet, SMARTSIG:1 Tablet(s) By Mouth Every 12 Hours PRN, Disp: , Rfl:    vitamin B-12 (CYANOCOBALAMIN) 1000 MCG tablet, Take 1,000 mcg by mouth daily., Disp: , Rfl:    Vitamin D, Ergocalciferol, (DRISDOL) 1.25 MG (50000 UNIT) CAPS capsule, Take 50,000 Units by mouth once a week., Disp: , Rfl:  Allergies  Allergen  Reactions   Metolazone Other (See Comments)    Dizziness and falling Per patient in 2021, states she does not think she fell because of this medication.   Plavix [Clopidogrel Bisulfate] Other (See Comments)    High PRU's - non-responder   Ibuprofen Other (See Comments)    Pt states she is not supposed to take ibuprofen because of other meds she is taking   Nsaids Other (See Comments)    "I do not take NSAIDs d/t interfering with other meds"      Social History   Socioeconomic History   Marital status: Single    Spouse name: Not on file   Number of children: 1   Years of education: Not on file   Highest education level: Not on file  Occupational History   Not on file  Tobacco Use   Smoking status: Former    Packs/day: 0.50    Years: 20.00    Pack years: 10.00    Types: Cigarettes    Quit date: 05/31/2018    Years since quitting: 3.0   Smokeless tobacco: Never   Tobacco comments:    smoked off and on x 20 years  Vaping Use   Vaping Use: Never used  Substance and Sexual Activity   Alcohol use: No    Alcohol/week: 0.0 standard drinks   Drug use: Not Currently    Types: Cocaine   Sexual activity: Not Currently  Other Topics Concern   Not on file  Social History Narrative   Origibnally from [place]   Most recently from [place]   Daughter lives in town      On Social security to CHF, COPD, CAD   Social Determinants of Health   Financial Resource Strain: Not on file  Food Insecurity: Not on file  Transportation Needs: Not on file  Physical Activity: Not on file  Stress: Not on file  Social Connections: Not on file  Intimate Partner Violence: Not on file    Physical Exam      Future Appointments  Date Time Provider  [place]  06/18/2021 10:30 AM Criselda Peaches, DPM [facility]  07/21/2021 10:00 AM [facility] DEVICE REMOTES [facility]  07/28/2021  2:00 PM [place] ECHO OP 1 [facility]OL[facility]  07/28/2021  3:00 PM [facility] PA/NP  [facility] None  08/26/2021  2:10 PM de [place], Raymond J, MD [facility]  09/02/2021 10:40 AM Larey Dresser, MD [facility] None  10/20/2021 10:00 AM [facility] DEVICE REMOTES [facility]  01/19/2022 10:00 AM [facility] DEVICE REMOTES [facility]  04/20/2022 10:00 AM [facility] DEVICE REMOTES [facility]       Marylouise Stacks, EMT-Paramedic [place] Paramedic  06/17/21

## 2021-06-18 ENCOUNTER — Ambulatory Visit (INDEPENDENT_AMBULATORY_CARE_PROVIDER_SITE_OTHER): Payer: Medicaid Other | Admitting: Podiatry

## 2021-06-18 DIAGNOSIS — Z91199 Patient's noncompliance with other medical treatment and regimen due to unspecified reason: Secondary | ICD-10-CM

## 2021-06-18 NOTE — Progress Notes (Signed)
Patient was no-show for appointment today 

## 2021-06-19 ENCOUNTER — Telehealth (HOSPITAL_BASED_OUTPATIENT_CLINIC_OR_DEPARTMENT_OTHER): Payer: Self-pay | Admitting: Family Medicine

## 2021-06-19 MED ORDER — ALBUTEROL SULFATE (5 MG/ML) 0.5% IN NEBU: 2.5000 mg | INHALATION_SOLUTION | Freq: Four times a day (QID) | RESPIRATORY_TRACT | 1 refills | Status: DC | PRN

## 2021-06-19 NOTE — Telephone Encounter (Signed)
Pt called stated she is needing acid reflex meds called in and albuterol (PROVENTIL) (5 MG/ML) 0.5% nebulizer solution [[id number]]   [place], [place]  Pt stated she has been out for a while and her pharmacy has never gotten the refill wfrom when she had her NP appt on 12/29 Please advise.

## 2021-06-22 ENCOUNTER — Telehealth: Payer: Self-pay

## 2021-06-22 NOTE — Telephone Encounter (Signed)
-----   Message from Rafael Bihari,  sent at 06/04/2021 12:37 PM EST ----- Leonie Man, Can you enroll Veora in monthly fluid monitoring? Thanks! -J

## 2021-06-22 NOTE — Telephone Encounter (Signed)
Referred to ICM clinic by Allena Katz, NP at HF clinic 06/04/2021 OV and Dr Curt Bears.   Attempted call to patient for ICM intro and left message for return call.    06/15/2021 [facility] report

## 2021-06-26 ENCOUNTER — Telehealth (HOSPITAL_BASED_OUTPATIENT_CLINIC_OR_DEPARTMENT_OTHER): Payer: Self-pay | Admitting: Family Medicine

## 2021-06-26 ENCOUNTER — Other Ambulatory Visit (HOSPITAL_COMMUNITY): Payer: Self-pay | Admitting: *Deleted

## 2021-06-26 ENCOUNTER — Telehealth (HOSPITAL_COMMUNITY): Payer: Self-pay | Admitting: Licensed Clinical Social Worker

## 2021-06-26 DIAGNOSIS — K219 Gastro-esophageal reflux disease without esophagitis: Secondary | ICD-10-CM

## 2021-06-26 MED ORDER — OMEPRAZOLE 40 MG PO CPDR: 40.0000 mg | DELAYED_RELEASE_CAPSULE | Freq: Every day | ORAL | 0 refills | Status: DC

## 2021-06-26 NOTE — Telephone Encounter (Signed)
Pt called in needing refill on  sacubitril-valsartan (ENTRESTO) 24-26 MG [[id number]]    Pt has been out of this medication for 2 days already.  Pt also said she wanted to go up to 2 mg to ozempic. Pt stated they have talked abou this at her last appt. Let pt know that she needs to leave these on the nurse line and that our office policy for refill can take up to 72 hours. Let pt know next time when she gets low to please call the office before hand.  Please advise.

## 2021-06-26 NOTE — Telephone Encounter (Signed)
Pt called CSW to get help getting refill on omeprazole.  CSW spoke with clinic staff who sent in 30 day supply but informed that future refills would need to be through PCP- CSW informed pt of above  Jorge Ny, [facility] Desk#: [phone] Cell#: [phone]

## 2021-06-26 NOTE — Telephone Encounter (Signed)
Patients ozempic is managed by her Endocrinologist Dr. Dwyane Dee, She will need to contact his office for advisement on increasing the dose Patients entresto is managed by the advanced heart failure clinic. She will need to reach out to them regarding refills Called patient to explain but no answer.  Will attempt to call later

## 2021-06-29 ENCOUNTER — Encounter (HOSPITAL_COMMUNITY): Payer: Self-pay

## 2021-06-29 NOTE — Progress Notes (Unsigned)
Medication Samples have been provided to the patient.  Drug name: Delene Loll       Strength: 24/26 mg         Qty: 2  LOT: [id number]  Exp.Date: 07/2023  Dosing instructions: Take 1 tablet Twice daily   The patient has been instructed regarding the correct time, dose, and frequency of taking this medication, including desired effects and most common side effects.   Juanita Laster Zedekiah Hinderman 4:18 PM 06/29/2021

## 2021-07-01 ENCOUNTER — Telehealth (HOSPITAL_COMMUNITY): Payer: Self-pay

## 2021-07-01 ENCOUNTER — Telehealth (HOSPITAL_COMMUNITY): Payer: Self-pay | Admitting: Pharmacist

## 2021-07-01 MED ORDER — ENTRESTO 24-26 MG PO TABS: 1.0000 | ORAL_TABLET | Freq: Two times a day (BID) | ORAL | 5 refills | Status: DC

## 2021-07-01 NOTE — Telephone Encounter (Signed)
Prior authorization for Delene Loll is now approved.

## 2021-07-01 NOTE — Telephone Encounter (Signed)
Advanced Heart Failure Patient Advocate Encounter  Prior Authorization for Delene Loll has been approved.    PA# [id number] Effective dates: 07/01/21 through 07/01/22  Audry Riles, PharmD, BCPS, BCCP, CPP Heart Failure Clinic Pharmacist [phone]

## 2021-07-01 NOTE — Telephone Encounter (Signed)
Pharmacy advised her entresto needs a PA to be refilled.  Marylouise Stacks, EMT-Paramedic  07/01/21

## 2021-07-01 NOTE — Telephone Encounter (Signed)
Patient Advocate Encounter   Received notification from [facility] that prior authorization for Tamara Silva is required.   PA submitted on CoverMyMeds Key [id number] Status is pending   Will continue to follow.   Audry Riles, PharmD, BCPS, BCCP, CPP Heart Failure Clinic Pharmacist [phone]

## 2021-07-02 ENCOUNTER — Other Ambulatory Visit (HOSPITAL_COMMUNITY): Payer: Self-pay

## 2021-07-02 ENCOUNTER — Encounter: Payer: Self-pay | Admitting: Endocrinology

## 2021-07-02 ENCOUNTER — Telehealth (HOSPITAL_COMMUNITY): Payer: Self-pay

## 2021-07-02 ENCOUNTER — Other Ambulatory Visit: Payer: Self-pay | Admitting: Endocrinology

## 2021-07-02 NOTE — Progress Notes (Signed)
°  Heart and Vascular Care Navigation  07/02/2021  Tamara Silva Oct 12, 1960 [id number]  Reason for Referral: Counseling resources   Engaged with patient by telephone for initial visit for Heart and Vascular Care Coordination.                                                                                                   Assessment: Student Intern called pt regarding counseling resources - referral came from [place] (paramedic). Pt reported concerns about bipolar disorder, insomnia, and stress. Pt's main concern was stress and wanting to know how to cope with stressful situations. Pt states she "wants to be calm" and has relied on prayer but wants to engage in "earthly resources". Pt has received mental health services in the past and stated those services were helpful until she got repeatedly sick and was not able to continue services.  Student Intern communicated to pt that next steps will be to collaborate with CSW and research counseling resources and call pt back to provide them with resources that were found.                                   HRT/VAS Care Coordination     Home Assistive Devices/Equipment Gilford Rile (specify type); Wheelchair; Eyeglasses; CBG Meter   DME Agency NA   [place] [facility] [employer]       Social History:                                                                             SDOH Screenings   Alcohol Screen: Not on file  Depression (PHQ2-9): Not on file  Financial Resource Strain: Not on file  Food Insecurity: Not on file  Housing: Not on file  Physical Activity: Not on file  Social Connections: Not on file  Stress: Not on file  Tobacco Use: Medium Risk   Smoking Tobacco Use: Former   Smokeless Tobacco Use: Never   Passive Exposure: Not on file  Transportation Needs: Not on file    Other Care Navigation Interventions:     Inpatient/Outpatient Substance Abuse Counseling/Rehab Options N/A  Provided Pharmacy assistance  resources  N/A  Patient expressed Dresser concerns Yes, Referred to:  [facility] Medicaid Counselors   Follow-up plan:    Send pt list of in-network providers. Pt to follow up regarding appointment.  Su Grand, MSW student intern [place] [phone]

## 2021-07-02 NOTE — Progress Notes (Addendum)
Paramedicine Encounter    Patient ID: Tamara Silva, female    DOB: November 07, 1960, 61 y.o.   MRN: [id number]  Pt reports doing ok. She walked from bedroom to living room and got short of breath. She reports that is normal for her. She used her neb inhaler and felt much better.   She needs more test strips to use her glucometer for back up.  [facility] will send over refill request to PCP.   She has not been weighing, the last time she weighed was at our last visit.   Her weight is up 12lbs from 2 wks ago. She has not been taking her torsemide BID. Advised her for at least the next 3-4 days to take it BID to get this fluid off of her.  She said she would. I encouraged her that should this continue to build up her sob will worsen and she will end up in the hosp so she needs to try at home to avoid that despite the lack of sleep she may get with taking it BID.   She wants to increase her ozempic dosage. Got her set up with mychart so she can send message to providers directly.   Meds reviewed- She is out of jardiance. X 2 days.  Asked pharmacy to refill that as well.     BP 108/64    Pulse 65    Resp 20    Wt (!) 349 lb (158.3 kg)    LMP 07/28/2013 Comment: perimenopausal   SpO2 98%    BMI 54.66 kg/m  Weight yesterday-? Last visit weight-337  Patient Care Team: [place], Blondell Reveal, MD as PCP - General (Family Medicine) Larey Dresser, MD as PCP - Advanced Heart Failure (Cardiology) Constance Haw, MD as PCP - Electrophysiology (Cardiology) Sueanne Margarita, MD as PCP - Cardiology (Cardiology)  Patient Active Problem List   Diagnosis Date Noted   NPDR (nonproliferative diabetic retinopathy) ([place]) 08/06/2020   Anemia of chronic disease 01/25/2020   Coronary artery disease involving native coronary artery of native heart with angina pectoris ([place]) 58/85/0277   Chronic systolic (congestive) heart failure ([place]) 04/04/2019   Diabetic peripheral neuropathy ([place]) 01/19/2019    Former smoker 01/19/2019   CKD (chronic kidney disease) stage 3, GFR 30-59 ml/min ([place]) 01/19/2019   Cardiac arrest ([place]) 10/28/2018   Noncompliance with treatment plan 07/24/2018   General patient noncompliance 07/24/2018   Noncompliance with diet and medication regimen 07/24/2018   Dysarthria    BiPAP (biphasic positive airway pressure) dependence    Type II diabetes mellitus with renal manifestations ([place]) 07/08/2018   Hepatic steatosis 06/22/2018   Chronic respiratory failure with hypoxia and hypercapnia ([place])    On home O2    Slow transit constipation    Cocaine abuse ([place])    Diabetes mellitus type 2 in obese ([place])    Cerebral embolism with cerebral infarction 06/01/2018   Pressure injury of skin 05/30/2018   Iron deficiency anemia 01/27/2018   Tobacco abuse disorder 10/03/2017   Dyslipidemia 04/29/2016   Primary insomnia 04/29/2016   Obesity hypoventilation syndrome (HCC)    OSA (obstructive sleep apnea)    Dyslipidemia associated with type 2 diabetes mellitus ([place]) 05/21/2015   COPD GOLD III  06/10/2014   CAD S/P percutaneous coronary angioplasty - prior PCI to LAD; RCA PCI: new Xience Alpine DES 2.75 mm x 15 mm  10/07/2013   Morbid obesity ([place]) 07/30/2013   Chronic combined systolic and diastolic heart failure ([place])  07/10/2013   Bipolar disorder ([place]) 02/08/2013   Essential hypertension 02/08/2013   COPD (chronic obstructive pulmonary disease) ([place]) 10/15/2012    Current Outpatient Medications:    ACCU-CHEK AVIVA PLUS test strip, USE 1 STRIP TO CHECK BLOOD SUGAR 3 TIMES A DAY ., Disp: 100 each, Rfl: 0   acetaminophen (TYLENOL) 500 MG tablet, Take 500 mg by mouth every 6 (six) hours as needed for moderate pain or headache., Disp: , Rfl:    albuterol (PROVENTIL) (5 MG/ML) 0.5% nebulizer solution, Take 0.5 mLs (2.5 mg total) by nebulization every 6 (six) hours as needed for wheezing or shortness of breath., Disp: 20 mL, Rfl: 1   ANORO ELLIPTA 62.5-25 MCG/INH AEPB, INHALE 1  PUFF INTO THE LUNGS DAILY., Disp: 60 each, Rfl: 5   ascorbic acid (VITAMIN C) 500 MG tablet, TAKE ONE TABLET BY MOUTH ONCE DAILY, Disp: 90 tablet, Rfl: 3   ASPIRIN LOW DOSE 81 MG EC tablet, CHEW 1 TABLET (81 MG TOTAL) BY MOUTH DAILY., Disp: 90 tablet, Rfl: 3   BIDIL 20-37.5 MG tablet, TAKE 1 TABLET BY MOUTH 3 (THREE) TIMES DAILY., Disp: 270 tablet, Rfl: 3   bisoprolol (ZEBETA) 5 MG tablet, TAKE 1 TABLET (5 MG TOTAL) BY MOUTH DAILY., Disp: 30 tablet, Rfl: 6   Blood Glucose Monitoring Suppl (ACCU-CHEK GUIDE) w/Device KIT, Use accu chek guide to check blood sugar three times daily., Disp: 1 kit, Rfl: 0   Continuous Blood Gluc Receiver (FREESTYLE LIBRE 2 READER) DEVI, Use to check blood sugar daily., Disp: 1 each, Rfl: 0   Continuous Blood Gluc Sensor (FREESTYLE LIBRE 2 SENSOR) MISC, 2 Devices by Does not apply route every 14 (fourteen) days., Disp: 2 each, Rfl: 3   FEROSUL 325 (65 Fe) MG tablet, TAKE 1 TABLET (325 MG TOTAL) BY MOUTH DAILY AFTER SUPPER. PLEASE TAKE WITH A SOURCE OF VITAMIN C, Disp: 90 tablet, Rfl: 3   fluticasone (FLONASE) 50 MCG/ACT nasal spray, PLACE 1 SPRAY INTO BOTH NOSTRILS DAILY., Disp: 16 g, Rfl: 1   hydrocerin (EUCERIN) CREA, Apply 1 application topically 2 (two) times daily., Disp: , Rfl: 0   insulin degludec (TRESIBA FLEXTOUCH) 200 UNIT/ML FlexTouch Pen, INJECT 80 UNITS UNDER THE SKIN ONCE DAILY, Disp: 18 mL, Rfl: 3   Insulin Pen Needle (EASY COMFORT PEN NEEDLES) 31G X 5 MM MISC, USE 3 TIMES A DAY FOR INSULIN ADMINISTRATION, Disp: 100 each, Rfl: 12   ipratropium-albuterol (DUONEB) 0.5-2.5 (3) MG/3ML SOLN, INHALE CONTENTS OF ONE VIAL USING NEBULIZER EVERY 6 HOURS AS NEEDED FOR SHORTNESS OF BREATH, WHEEZING, Disp: 360 mL, Rfl: 11   JARDIANCE 10 MG TABS tablet, TAKE ONE TABLET BY MOUTH ONCE DAILY BEFORE BREAKFAST, Disp: 30 tablet, Rfl: 1   NEEDLE, DISP, 26 G 26G X 1/2" MISC, 1 application by Does not apply route daily., Disp: 100 each, Rfl: 0   NOVOLOG FLEXPEN 100 UNIT/ML FlexPen,  Inject 20 units under the skin at breakfast and lunch, and 24 units at dinner. (Patient taking differently: Inject 20 units under the skin at breakfast and lunch, and 24 units at dinner.), Disp: 30 mL, Rfl: 4   omeprazole (PRILOSEC) 40 MG capsule, Take 1 capsule (40 mg total) by mouth daily., Disp: 90 capsule, Rfl: 0   OZEMPIC, 1 MG/DOSE, 4 MG/3ML SOPN, INJECT 1 MG INTO THE SKIN ONCE A WEEK, Disp: 3 mL, Rfl: 1   potassium chloride SA (KLOR-CON) 20 MEQ tablet, TAKE 2 TABLETS (40 MEQ TOTAL) BY MOUTH DAILY. (Patient taking differently: No sig reported), Disp:  90 tablet, Rfl: 1   PROAIR HFA 108 (90 Base) MCG/ACT inhaler, Inhale 1-2 puffs into the lungs every 4 (four) hours as needed for wheezing or shortness of breath., Disp: 8.5 g, Rfl: 0   RESTASIS 0.05 % ophthalmic emulsion, Place 1 drop into both eyes daily as needed. , Disp: , Rfl:    rosuvastatin (CRESTOR) 40 MG tablet, TAKE 1 TABLET (40 MG TOTAL) BY MOUTH DAILY AT 6 PM., Disp: 90 tablet, Rfl: 0   sacubitril-valsartan (ENTRESTO) 24-26 MG, Take 1 tablet by mouth 2 (two) times daily., Disp: 60 tablet, Rfl: 5   spironolactone (ALDACTONE) 25 MG tablet, Take 1 tablet by mouth daily., Disp: , Rfl:    Tiotropium Bromide-Olodaterol (STIOLTO RESPIMAT) 2.5-2.5 MCG/ACT AERS, Inhale 2 puffs into the lungs daily., Disp: 1 each, Rfl: 5   torsemide (DEMADEX) 100 MG tablet, TAKE 1 TABLET (100 MG TOTAL) BY MOUTH 2 (TWO) TIMES DAILY. (Patient taking differently: No sig reported), Disp: 60 tablet, Rfl: 4   traMADol (ULTRAM) 50 MG tablet, SMARTSIG:1 Tablet(s) By Mouth Every 12 Hours PRN, Disp: , Rfl:    vitamin B-12 (CYANOCOBALAMIN) 1000 MCG tablet, Take 1,000 mcg by mouth daily., Disp: , Rfl:    Vitamin D, Ergocalciferol, (DRISDOL) 1.25 MG (50000 UNIT) CAPS capsule, Take 50,000 Units by mouth once a week., Disp: , Rfl:    Semaglutide, 1 MG/DOSE, (OZEMPIC, 1 MG/DOSE,) 2 MG/1.5ML SOPN, Inject 1 mg into the skin once a week. (Patient not taking: Reported on 07/02/2021),  Disp: 3 mL, Rfl: 2 Allergies  Allergen Reactions   Metolazone Other (See Comments)    Dizziness and falling Per patient in 2021, states she does not think she fell because of this medication.   Plavix [Clopidogrel Bisulfate] Other (See Comments)    High PRU's - non-responder   Ibuprofen Other (See Comments)    Pt states she is not supposed to take ibuprofen because of other meds she is taking   Nsaids Other (See Comments)    "I do not take NSAIDs d/t interfering with other meds"      Social History   Socioeconomic History   Marital status: Single    Spouse name: Not on file   Number of children: 1   Years of education: Not on file   Highest education level: Not on file  Occupational History   Not on file  Tobacco Use   Smoking status: Former    Packs/day: 0.50    Years: 20.00    Pack years: 10.00    Types: Cigarettes    Quit date: 05/31/2018    Years since quitting: 3.0   Smokeless tobacco: Never   Tobacco comments:    smoked off and on x 20 years  Vaping Use   Vaping Use: Never used  Substance and Sexual Activity   Alcohol use: No    Alcohol/week: 0.0 standard drinks   Drug use: Not Currently    Types: Cocaine   Sexual activity: Not Currently  Other Topics Concern   Not on file  Social History Narrative   Origibnally from [place]   Most recently from [place]   Daughter lives in town      On Social security to CHF, COPD, CAD   Social Determinants of Health   Financial Resource Strain: Not on file  Food Insecurity: Not on file  Transportation Needs: Not on file  Physical Activity: Not on file  Stress: Not on file  Social Connections: Not on file  Intimate Partner Violence:  Not on file    Physical Exam      Future Appointments  Date Time Provider [place]  07/21/2021 10:00 AM [facility] DEVICE REMOTES [facility]  07/28/2021  2:00 PM [place] ECHO OP 1 [facility]  07/28/2021  3:00 PM [facility] PA/NP [facility] None  08/26/2021   2:10 PM de [place], Raymond J, MD [facility]  09/02/2021 10:40 AM Larey Dresser, MD [facility] None  10/20/2021 10:00 AM [facility] DEVICE REMOTES [facility]  01/19/2022 10:00 AM [facility] DEVICE REMOTES [facility]  04/20/2022 10:00 AM [facility] DEVICE REMOTES [facility]       Marylouise Stacks, EMT-Paramedic [place] [facility]  07/02/21

## 2021-07-08 NOTE — Telephone Encounter (Signed)
Spoke with patient and agreeable to monthly follow up.  Advised transmission will send automatically between 12 Midnight and 6:00 AM if monitor is by bedside.  Explained will call with results after transmission is reviewed.  Explained a Remote Home Transmission will be seen as daytime appointment on office summary visit sheet but the time is not relevant since all transmissions are sent at night time so there is no obligation to stay by the monitor at that time appointed time during the day.  Provided ICM direct number and explained should call if experiencing any fluid symptoms such as weight gain, shortness of breath or extremity/abdominal swelling.  1st ICM remote transmission scheduled for 07/20/2021.

## 2021-07-13 ENCOUNTER — Other Ambulatory Visit: Payer: Self-pay | Admitting: Endocrinology

## 2021-07-13 DIAGNOSIS — E1165 Type 2 diabetes mellitus with hyperglycemia: Secondary | ICD-10-CM

## 2021-07-15 ENCOUNTER — Telehealth (HOSPITAL_COMMUNITY): Payer: Self-pay | Admitting: Licensed Clinical Social Worker

## 2021-07-15 NOTE — Telephone Encounter (Signed)
HF Paramedicine Team Based Care Meeting  HF MD- NA  HF NP - [place] NP-C   [facility] admit within the last 30 days for heart failure? no  Medications concerns? Self doses some medications for convenience but manages most of them correctly  Transportation issues? no  Education needs? Not weighing daily  Eligible for discharge? Has not been back on paramedicine for a long period of time- will consider DC pending next clinic visit on 2/28   Jorge Ny, [place] [facility] Desk#: [phone] Cell#: [phone]

## 2021-07-20 ENCOUNTER — Other Ambulatory Visit: Payer: Self-pay | Admitting: Endocrinology

## 2021-07-20 DIAGNOSIS — I25119 Atherosclerotic heart disease of native coronary artery with unspecified angina pectoris: Secondary | ICD-10-CM

## 2021-07-21 ENCOUNTER — Ambulatory Visit (INDEPENDENT_AMBULATORY_CARE_PROVIDER_SITE_OTHER): Payer: Medicaid Other

## 2021-07-21 DIAGNOSIS — I469 Cardiac arrest, cause unspecified: Secondary | ICD-10-CM | POA: Diagnosis not present

## 2021-07-21 LAB — CUP PACEART REMOTE DEVICE CHECK
Battery Remaining Longevity: 123 mo
Battery Voltage: 3.02 V
Brady Statistic RV Percent Paced: 0.01 %
Date Time Interrogation Session: 20230221043823
HighPow Impedance: 68 Ohm
Implantable Lead Implant Date: 20201104
Implantable Lead Location: 753860
Implantable Pulse Generator Implant Date: 20201104
Lead Channel Impedance Value: 304 Ohm
Lead Channel Impedance Value: 380 Ohm
Lead Channel Pacing Threshold Amplitude: 0.5 V
Lead Channel Pacing Threshold Pulse Width: 0.4 ms
Lead Channel Sensing Intrinsic Amplitude: 15.75 mV
Lead Channel Sensing Intrinsic Amplitude: 15.75 mV
Lead Channel Setting Pacing Amplitude: 2.5 V
Lead Channel Setting Pacing Pulse Width: 0.4 ms
Lead Channel Setting Sensing Sensitivity: 0.3 mV

## 2021-07-22 ENCOUNTER — Ambulatory Visit (INDEPENDENT_AMBULATORY_CARE_PROVIDER_SITE_OTHER): Payer: Medicaid Other

## 2021-07-22 ENCOUNTER — Telehealth: Payer: Self-pay | Admitting: Adult Health

## 2021-07-22 DIAGNOSIS — Z95810 Presence of automatic (implantable) cardiac defibrillator: Secondary | ICD-10-CM

## 2021-07-22 DIAGNOSIS — I5022 Chronic systolic (congestive) heart failure: Secondary | ICD-10-CM | POA: Diagnosis not present

## 2021-07-22 NOTE — Progress Notes (Signed)
EPIC Encounter for ICM Monitoring  Patient Name: Tamara Silva is a 61 y.o. female Date: 07/22/2021 Primary Care Physican: de [place], Blondell Reveal, MD Primary Cardiologist: Aundra Dubin Electrophysiologist: Curt Bears Weight:  unknown       1st ICM remote transmission.  Heart Failure questions reviewed.  Pt asymptomatic for fluid symptoms.   Optivol thoracic impedance normal but was suggesting possible fluid accumulation from 1/19-2/1.  Prescribed:  Torsemide 100 mg take 1 tablet (100 mg total) by mouth twice a day. Potassium 20 mEq take 2 tablet (40 mEq total) by mouth daily. Spironolactone 25 mg take 1 tablet daily Jardiance 10 mg take 1 tablet daily before breakfast  Recommendations:  Encouraged to call if experiencing fluid symptoms.  Follow-up plan: ICM clinic phone appointment on 08/24/2021.   91 day device clinic remote transmission 10/20/2021.    EP/Cardiology Office Visits: 07/28/2021 with [facility] PA/NP.    Copy of ICM check sent to Dr. Curt Bears.   3 month ICM trend: 07/21/2021.    12-14 Month ICM trend:     [name], RN 07/22/2021 7:50 AM

## 2021-07-24 NOTE — Telephone Encounter (Signed)
Will verify with Nira Conn that form was received.

## 2021-07-24 NOTE — Telephone Encounter (Signed)
Patient dropped off a form for [name], NP to fill out for [employer] stating she has necessary medical equipment. Patient would like forms faxed to [facility] directly and mail her a copy. Forms placed in Tammy's box.   Please advise.

## 2021-07-24 NOTE — Telephone Encounter (Signed)
Routing to [place] for her to follow up on.

## 2021-07-27 ENCOUNTER — Other Ambulatory Visit: Payer: Self-pay | Admitting: Endocrinology

## 2021-07-27 ENCOUNTER — Telehealth (HOSPITAL_COMMUNITY): Payer: Self-pay | Admitting: Licensed Clinical Social Worker

## 2021-07-27 ENCOUNTER — Telehealth: Payer: Self-pay | Admitting: Adult Health

## 2021-07-27 DIAGNOSIS — Z794 Long term (current) use of insulin: Secondary | ICD-10-CM

## 2021-07-27 DIAGNOSIS — E1165 Type 2 diabetes mellitus with hyperglycemia: Secondary | ICD-10-CM

## 2021-07-27 DIAGNOSIS — J449 Chronic obstructive pulmonary disease, unspecified: Secondary | ICD-10-CM

## 2021-07-27 MED ORDER — IPRATROPIUM-ALBUTEROL 0.5-2.5 (3) MG/3ML IN SOLN: RESPIRATORY_TRACT | 5 refills | Status: DC

## 2021-07-27 NOTE — Telephone Encounter (Signed)
Patient would like form faxed to [phone] ATTN: low income assistance program. Patient phone number is [phone].

## 2021-07-27 NOTE — Telephone Encounter (Signed)
This is on Tammy Desk to sign and we will let patient know once its completed. Waiting on signature.

## 2021-07-27 NOTE — Telephone Encounter (Signed)
Noted.  Will close encounter.  

## 2021-07-27 NOTE — Telephone Encounter (Signed)
Duoneb sent to preferred pharmacy.  Lm for patient.

## 2021-07-27 NOTE — Telephone Encounter (Signed)
CSW received call from pt requesting help with getting [facility] to take her to appt tomorrow.  CSW informed pt that [facility] being shut down so will need to schedule rides through her Medicaid benefit from now on.  CSW set up for tomorrow.  She utilizes a wheelchair but has a friend  who will accompany her and help her in and out of the car  Jorge Ny, [facility] Desk#: [phone] Cell#: [phone]

## 2021-07-27 NOTE — Telephone Encounter (Signed)
Patient checking on paperwork. Patient phone number is [phone].

## 2021-07-27 NOTE — Telephone Encounter (Signed)
Notified patient that Duoneb called into pharmacy.

## 2021-07-28 ENCOUNTER — Other Ambulatory Visit: Payer: Self-pay

## 2021-07-28 ENCOUNTER — Ambulatory Visit (HOSPITAL_COMMUNITY)
Admission: RE | Admit: 2021-07-28 | Discharge: 2021-07-28 | Disposition: A | Discharge status: Home / Self Care | Payer: Medicaid Other | Source: Ambulatory Visit | Attending: Family Medicine | Admitting: Family Medicine

## 2021-07-28 ENCOUNTER — Encounter (HOSPITAL_COMMUNITY): Payer: Self-pay

## 2021-07-28 ENCOUNTER — Other Ambulatory Visit (HOSPITAL_COMMUNITY): Payer: Self-pay

## 2021-07-28 ENCOUNTER — Ambulatory Visit (HOSPITAL_BASED_OUTPATIENT_CLINIC_OR_DEPARTMENT_OTHER)
Admission: RE | Admit: 2021-07-28 | Discharge: 2021-07-28 | Disposition: A | Discharge status: Home / Self Care | Payer: Medicaid Other | Source: Ambulatory Visit | Attending: Cardiology | Admitting: Cardiology

## 2021-07-28 VITALS — BP 110/60 | HR 62 | Ht 67.0 in | Wt 336.4 lb

## 2021-07-28 DIAGNOSIS — Z7982 Long term (current) use of aspirin: Secondary | ICD-10-CM | POA: Insufficient documentation

## 2021-07-28 DIAGNOSIS — I272 Pulmonary hypertension, unspecified: Secondary | ICD-10-CM

## 2021-07-28 DIAGNOSIS — I2510 Atherosclerotic heart disease of native coronary artery without angina pectoris: Secondary | ICD-10-CM | POA: Insufficient documentation

## 2021-07-28 DIAGNOSIS — Z8673 Personal history of transient ischemic attack (TIA), and cerebral infarction without residual deficits: Secondary | ICD-10-CM | POA: Diagnosis not present

## 2021-07-28 DIAGNOSIS — I130 Hypertensive heart and chronic kidney disease with heart failure and stage 1 through stage 4 chronic kidney disease, or unspecified chronic kidney disease: Secondary | ICD-10-CM | POA: Diagnosis not present

## 2021-07-28 DIAGNOSIS — Z86711 Personal history of pulmonary embolism: Secondary | ICD-10-CM | POA: Insufficient documentation

## 2021-07-28 DIAGNOSIS — N183 Chronic kidney disease, stage 3 unspecified: Secondary | ICD-10-CM | POA: Insufficient documentation

## 2021-07-28 DIAGNOSIS — I252 Old myocardial infarction: Secondary | ICD-10-CM | POA: Diagnosis not present

## 2021-07-28 DIAGNOSIS — Z7984 Long term (current) use of oral hypoglycemic drugs: Secondary | ICD-10-CM | POA: Insufficient documentation

## 2021-07-28 DIAGNOSIS — Z87891 Personal history of nicotine dependence: Secondary | ICD-10-CM | POA: Diagnosis not present

## 2021-07-28 DIAGNOSIS — Z7985 Long-term (current) use of injectable non-insulin antidiabetic drugs: Secondary | ICD-10-CM | POA: Insufficient documentation

## 2021-07-28 DIAGNOSIS — I5022 Chronic systolic (congestive) heart failure: Secondary | ICD-10-CM

## 2021-07-28 DIAGNOSIS — E662 Morbid (severe) obesity with alveolar hypoventilation: Secondary | ICD-10-CM | POA: Diagnosis not present

## 2021-07-28 DIAGNOSIS — I469 Cardiac arrest, cause unspecified: Secondary | ICD-10-CM | POA: Diagnosis not present

## 2021-07-28 DIAGNOSIS — Z9981 Dependence on supplemental oxygen: Secondary | ICD-10-CM | POA: Insufficient documentation

## 2021-07-28 DIAGNOSIS — Z6841 Body Mass Index (BMI) 40.0 and over, adult: Secondary | ICD-10-CM | POA: Diagnosis not present

## 2021-07-28 DIAGNOSIS — Z955 Presence of coronary angioplasty implant and graft: Secondary | ICD-10-CM | POA: Diagnosis not present

## 2021-07-28 DIAGNOSIS — E538 Deficiency of other specified B group vitamins: Secondary | ICD-10-CM | POA: Insufficient documentation

## 2021-07-28 DIAGNOSIS — Z95810 Presence of automatic (implantable) cardiac defibrillator: Secondary | ICD-10-CM | POA: Insufficient documentation

## 2021-07-28 DIAGNOSIS — Z596 Low income: Secondary | ICD-10-CM | POA: Diagnosis not present

## 2021-07-28 DIAGNOSIS — G4733 Obstructive sleep apnea (adult) (pediatric): Secondary | ICD-10-CM

## 2021-07-28 DIAGNOSIS — Z780 Asymptomatic menopausal state: Secondary | ICD-10-CM | POA: Insufficient documentation

## 2021-07-28 DIAGNOSIS — Z79899 Other long term (current) drug therapy: Secondary | ICD-10-CM | POA: Diagnosis not present

## 2021-07-28 DIAGNOSIS — J449 Chronic obstructive pulmonary disease, unspecified: Secondary | ICD-10-CM | POA: Insufficient documentation

## 2021-07-28 DIAGNOSIS — E785 Hyperlipidemia, unspecified: Secondary | ICD-10-CM | POA: Insufficient documentation

## 2021-07-28 DIAGNOSIS — Z8674 Personal history of sudden cardiac arrest: Secondary | ICD-10-CM | POA: Insufficient documentation

## 2021-07-28 DIAGNOSIS — E1122 Type 2 diabetes mellitus with diabetic chronic kidney disease: Secondary | ICD-10-CM | POA: Diagnosis not present

## 2021-07-28 DIAGNOSIS — D509 Iron deficiency anemia, unspecified: Secondary | ICD-10-CM | POA: Insufficient documentation

## 2021-07-28 LAB — ECHOCARDIOGRAM COMPLETE
AR max vel: 2.62 cm2
AV Peak grad: 7.1 mmHg
Ao pk vel: 1.33 m/s
Area-P 1/2: 2.43 cm2
Calc EF: 34 %
Height: 67 in
S' Lateral: 5.7 cm
Single Plane A2C EF: 32 %
Single Plane A4C EF: 34.4 %

## 2021-07-28 LAB — LIPID PANEL
Cholesterol: 128 mg/dL (ref 0–200)
HDL: 58 mg/dL (ref >40)
LDL Cholesterol: 55 mg/dL (ref 0–99)
Total CHOL/HDL Ratio: 2.2 RATIO
Triglycerides: 74 mg/dL (ref <150)
VLDL: 15 mg/dL (ref 0–40)

## 2021-07-28 LAB — BASIC METABOLIC PANEL
Anion gap: 9 (ref 5–15)
BUN: 50 mg/dL — ABNORMAL HIGH (ref 8–23)
CO2: 29 mmol/L (ref 22–32)
Calcium: 9.5 mg/dL (ref 8.9–10.3)
Chloride: 101 mmol/L (ref 98–111)
Creatinine, Ser: 2.2 mg/dL — ABNORMAL HIGH (ref 0.44–1.00)
GFR, Estimated: 25 mL/min — ABNORMAL LOW (ref >60)
Glucose, Bld: 92 mg/dL (ref 70–99)
Potassium: 4.1 mmol/L (ref 3.5–5.1)
Sodium: 139 mmol/L (ref 135–145)

## 2021-07-28 MED ORDER — PERFLUTREN LIPID MICROSPHERE
1.0000 mL | INTRAVENOUS | Status: DC | PRN
  Administered 2021-07-28: 2 mL via INTRAVENOUS
  Filled 2021-07-28: qty 10

## 2021-07-28 NOTE — Progress Notes (Signed)
Paramedicine Encounter ° ° °Patient ID: Tamara Silva , female,   DOB: 02/07/1961,60 y.o.,  MRN: [id number] ° ° °Met patient in clinic today with provider.  °Weight @ clinic-336 °B/p-110/60 °P-62 °Sp02-92% on 02 ° °Pt reports feeling good.  °Denies increased sob, no dizziness, no c/p.  °Able to walk 1/2 block.  °ECHO done today. Waiting on cardio to read ECHO.  °She is out of her ozempic right now, she has to be seen by PCP before he will refill. She goes there next week.  °She does want to increase her dose on that.  °No fluid today.  °Labs today.  °Will f/u in a couple wks.  °Vit d3 increased to BID per kidney doc.  ° ° , EMT-Paramedic °[phone] °07/28/2021 ° ° ° ° ° °

## 2021-07-28 NOTE — Progress Notes (Signed)
Remote ICD transmission.   

## 2021-07-28 NOTE — Progress Notes (Signed)
Tamara Silva, DOB [birth year], MRN [id number]   Provider location: [place] [facility] Type of Visit: Established patient   PCP:  de [place], Raymond J, MD Endocrinology: Dr. Dwyane Dee  HF Cardiology: Dr Aundra Dubin   History of Present Illness: Tamara Silva is a 61 y.o. 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  She has had frequent admits for anemia and CHF with transfusions and diuresis. Prasugrel was stopped. No source of bleeding found on EGD and colonoscopy.   She is on home 02 and nocturnal BiPAP for OHS/OSA and COPD.   She no longer smokes.  Hx of PE in 2014.    Hx of bradycardia with Coreg at higher dose.  Echo 05/31/18 with EF 40-45%, PA peak pressure 71 mmHg.    Admitted 10/25/18 with SOB and edema, + productive cough.  This started over a few weeks prior to admission. Hospital course complicated by PEA arrest on 10/28/18. CPR for 25 minutes. Extubated 10/30/18.  Developed AKI.  HF team followed closely to optimize HF.  She was massively volume overloaded and extensively diuresed, weight down 65 lbs total.  Echo showed EF down to 30-35%.  No coronary angiography due to AKI.   Readmitted  11/17/18 with lower extremity edema and volume overload. Diuresed with IV lasix and transitioned to torsemide 40 mg twice daily. Discharged on 11/21/18 to home on oxygen.   She had RHC/LHC in 9/20, most significant stenosis was 75% ostial D1.  There was mild to moderate nonobstructive disease in other vessels. There was not an interventional target.  Filling pressures were mildly elevated with preserved cardiac output.  Torsemide was increased.   Echo in 10/20 showed that EF remained 30-35% with wall motion abnormalities, RV appeared normal.   Medtronic ICD placed in 11/20.   She was lost to follow up since 7/21. Returned 1/23 and had been off metolazone for several  months. She was mildly SOB walking around her house. Repeat echo and paramedicine arranged.  Echo today 07/28/21, results pending.  Today she returns for HF follow up with paramedicine. She is overall feeling well. She is on oxygen during the day and wearing trilogy every night. She has mild dyspnea walking around her house, but is overall not very active. Denies abnormal bleeding, palpitations, dizziness, CP, edema. She has chronic orthopnea. Appetite ok. No fever or chills. She does not weigh at home. She has not needed metolazone in several months.  Device interrogation (personally reviewed): OptiVol down, thoracic impedence stable, daily activity < 30 minutes, 1 short bursts of ? AF, no VT.  Labs (7/20): K 3.7, creatinine 1.56 Labs (9/20): K 3.7, creatinine 1.46, LDL 52, HDL 56  Labs (12/20): K 3.8, creatinine 1.51 Labs (1/21): hgb 10.6, K 4.4, creatinine 1.91 Labs (5/21): LDL 68, HDL 56 Labs (7/21): K 4.4, creatinine 2.34, hgb 9.7 Labs (11/22): K 4.4., creatinine 2.30, hgb 9.3 Labs (1/23): K 4.0, creatinine 2.32  PMH: 1. CVA (1/20). 2. Type 2 diabetes 3. HTN 4. H/o seizures 5. PEA arrest (5/20) with CPR.  6. COPD: Home oxygen.  Stopped smoking in 1/15.  7. CAD: PCI [facility] 2006, [facility] 2009.  - Inferior STEMI 5/15, DES to RCA.  - Poor responder to Plavix.  - LHC (9/20): 75% ostial D1, 40-50% mLCx, 40% proximal PLV.  8. Hyperlipidemia 9. PE: 2014, LLL.  10. OHS/OSA: Home oxygen and Bipap used.  11. Chronic systolic CHF: Suspect primarily ischemic cardiomyopathy. Medtronic ICD.  - Echo (5/15): EF 35-40% - Echo (2/16): EF 40-45% - Echo (1/20): EF 40-45% - Echo (5/20): EF 30-35%, mild RV dilation.  - RHC (5/20): mean RA 9, PA 62/22 mean 37, mean PCWP 15, CI 4.29, PVR 2.1 WU.  - RHC (9/20): mean RA 11, PA 66/18 mean 37, mean PCWP 19, CI 3.12, PVR 2.4 WU - Echo (10/20): EF 30-35% with wall motion abnormalities, normal RV.  12. CKD: Stage 3.  13. Fe deficiency anemia: EGD  and colonoscopy without definite source of bleeding.   ROS: All systems negative except as listed in HPI, PMH and Problem List.  SH:  Social History   Socioeconomic History   Marital status: Single    Spouse name: Not on file   Number of children: 1   Years of education: Not on file   Highest education level: Not on file  Occupational History   Not on file  Tobacco Use   Smoking status: Former    Packs/day: 0.50    Years: 20.00    Pack years: 10.00    Types: Cigarettes    Quit date: 05/31/2018    Years since quitting: 3.1   Smokeless tobacco: Never   Tobacco comments:    smoked off and on x 20 years  Vaping Use   Vaping Use: Never used  Substance and Sexual Activity   Alcohol use: No    Alcohol/week: 0.0 standard drinks   Drug use: Not Currently    Types: Cocaine   Sexual activity: Not Currently  Other Topics Concern   Not on file  Social History Narrative   Origibnally from [place]   Most recently from [place]   Daughter lives in town      On Social security to CHF, COPD, CAD   Social Determinants of Health   Financial Resource Strain: Not on file  Food Insecurity: Not on file  Transportation Needs: Not on file  Physical Activity: Not on file  Stress: Not on file  Social Connections: Not on file  Intimate Partner Violence: Not on file   FH:  Family History  Problem Relation Age of Onset   Stroke Mother    Hypertension Mother    Colon cancer Father    Diabetes Father    Heart attack Father    Sarcoidosis Other    Breast cancer Paternal Aunt    Emphysema Brother    Lung disease Brother        Unknown type, 3 brothers, one with liver and lung disease   Diabetes Brother    Asthma Paternal Aunt    Diabetes Sister     Current Outpatient Medications  Medication Sig Dispense Refill   ACCU-CHEK AVIVA PLUS test strip USE 1 STRIP TO CHECK BLOOD SUGAR 3 TIMES A DAY . 100 each 0   acetaminophen (TYLENOL) 500 MG tablet Take 500 mg by mouth every 6 (six)  hours as needed for moderate pain or headache.     albuterol (PROVENTIL) (5 MG/ML) 0.5% nebulizer solution Take 0.5 mLs (2.5 mg total) by nebulization every 6 (six) hours as needed for wheezing or shortness of breath. 20 mL 1   ANORO ELLIPTA 62.5-25 MCG/INH AEPB INHALE 1 PUFF INTO THE LUNGS DAILY. 60 each 5   ascorbic acid (VITAMIN C) 500 MG tablet TAKE ONE TABLET BY MOUTH ONCE DAILY 90  tablet 3   ASPIRIN LOW DOSE 81 MG EC tablet CHEW 1 TABLET (81 MG TOTAL) BY MOUTH DAILY. 90 tablet 3   BIDIL 20-37.5 MG tablet TAKE 1 TABLET BY MOUTH 3 (THREE) TIMES DAILY. 270 tablet 3   bisoprolol (ZEBETA) 5 MG tablet TAKE 1 TABLET (5 MG TOTAL) BY MOUTH DAILY. 30 tablet 6   Blood Glucose Monitoring Suppl (ACCU-CHEK GUIDE) w/Device KIT Use accu chek guide to check blood sugar three times daily. 1 kit 0   Continuous Blood Gluc Receiver (FREESTYLE LIBRE 2 READER) DEVI Use to check blood sugar daily. 1 each 0   Continuous Blood Gluc Sensor (FREESTYLE LIBRE 2 SENSOR) MISC USE EVERY 14 (FOURTEEN) DAYS. 2 each 3   D3-1000 25 MCG (1000 UT) tablet Take 1,000 Units by mouth 2 (two) times daily.     FEROSUL 325 (65 Fe) MG tablet TAKE 1 TABLET (325 MG TOTAL) BY MOUTH DAILY AFTER SUPPER. PLEASE TAKE WITH A SOURCE OF VITAMIN C 90 tablet 3   fluticasone (FLONASE) 50 MCG/ACT nasal spray PLACE 1 SPRAY INTO BOTH NOSTRILS DAILY. 16 g 1   hydrocerin (EUCERIN) CREA Apply 1 application topically 2 (two) times daily.  0   insulin degludec (TRESIBA FLEXTOUCH) 200 UNIT/ML FlexTouch Pen INJECT 80 UNITS UNDER THE SKIN ONCE DAILY 18 mL 3   Insulin Pen Needle (EASY COMFORT PEN NEEDLES) 31G X 5 MM MISC USE 3 TIMES A DAY FOR INSULIN ADMINISTRATION 100 each 12   ipratropium-albuterol (DUONEB) 0.5-2.5 (3) MG/3ML SOLN INHALE CONTENTS OF ONE VIAL USING NEBULIZER EVERY 6 HOURS AS NEEDED FOR SHORTNESS OF BREATH, WHEEZING 360 mL 5   JARDIANCE 10 MG TABS tablet TAKE ONE TABLET BY MOUTH ONCE DAILY BEFORE BREAKFAST 30 tablet 1   NEEDLE, DISP, 26 G 26G X  1/2" MISC 1 application by Does not apply route daily. 100 each 0   NOVOLOG FLEXPEN 100 UNIT/ML FlexPen Inject 20 units under the skin at breakfast and lunch, and 24 units at dinner. (Patient taking differently: Inject 20 units under the skin at breakfast and lunch, and 24 units at dinner.) 30 mL 4   omeprazole (PRILOSEC) 40 MG capsule Take 1 capsule (40 mg total) by mouth daily. 90 capsule 0   OZEMPIC, 1 MG/DOSE, 4 MG/3ML SOPN INJECT 1 MG INTO THE SKIN ONCE A WEEK 3 mL 1   potassium chloride SA (KLOR-CON) 20 MEQ tablet TAKE 2 TABLETS (40 MEQ TOTAL) BY MOUTH DAILY. (Patient taking differently: No sig reported) 90 tablet 1   RESTASIS 0.05 % ophthalmic emulsion Place 1 drop into both eyes daily as needed.      rosuvastatin (CRESTOR) 40 MG tablet TAKE 1 TABLET (40 MG TOTAL) BY MOUTH DAILY AT 6 PM. 90 tablet 0   sacubitril-valsartan (ENTRESTO) 24-26 MG Take 1 tablet by mouth 2 (two) times daily. 60 tablet 5   Semaglutide, 1 MG/DOSE, (OZEMPIC, 1 MG/DOSE,) 2 MG/1.5ML SOPN Inject 1 mg into the skin once a week. 3 mL 2   spironolactone (ALDACTONE) 25 MG tablet Take 1 tablet by mouth daily.     Tiotropium Bromide-Olodaterol (STIOLTO RESPIMAT) 2.5-2.5 MCG/ACT AERS Inhale 2 puffs into the lungs daily. 1 each 5   torsemide (DEMADEX) 100 MG tablet TAKE 1 TABLET (100 MG TOTAL) BY MOUTH 2 (TWO) TIMES DAILY. (Patient taking differently: No sig reported) 60 tablet 4   vitamin B-12 (CYANOCOBALAMIN) 1000 MCG tablet Take 1,000 mcg by mouth daily.     PROAIR HFA 108 (90 Base) MCG/ACT inhaler Inhale 1-2  puffs into the lungs every 4 (four) hours as needed for wheezing or shortness of breath. (Patient not taking: Reported on 07/28/2021) 8.5 g 0   traMADol (ULTRAM) 50 MG tablet SMARTSIG:1 Tablet(s) By Mouth Every 12 Hours PRN (Patient not taking: Reported on 07/28/2021)     Vitamin D, Ergocalciferol, (DRISDOL) 1.25 MG (50000 UNIT) CAPS capsule Take 50,000 Units by mouth once a week. (Patient not taking: Reported on 07/28/2021)      No current facility-administered medications for this encounter.   Facility-Administered Medications Ordered in Other Encounters  Medication Dose Route Frequency Provider Last Rate Last Admin   perflutren lipid microspheres (DEFINITY) IV suspension  1-10 mL Intravenous PRN de [place], Blondell Reveal, MD   2 mL at 07/28/21 1453   BP 110/60    Pulse 62    Ht 5' 7" (1.702 m)    Wt (!) 152.6 kg (336 lb 6.4 oz)    LMP 07/28/2013 Comment: perimenopausal   SpO2 92%    BMI 52.69 kg/m   Wt Readings from Last 3 Encounters:  07/28/21 (!) 152.6 kg (336 lb 6.4 oz)  07/02/21 (!) 158.3 kg (349 lb)  06/17/21 (!) 152.9 kg (337 lb)   Physical Exam:   General:  NAD. No resp difficulty, arrived in [facility] on 2L oxygen. HEENT: Normal Neck: Supple. Thick neck. No JVD. Carotids 2+ bilat; no bruits. No lymphadenopathy or thryomegaly appreciated. Cor: PMI nondisplaced. Regular rate & rhythm. No rubs, gallops or murmurs. Lungs: Clear Abdomen: Obese, nontender, nondistended. No hepatosplenomegaly. No bruits or masses. Good bowel sounds. Extremities: No cyanosis, clubbing, rash, edema Neuro: Alert & oriented x 3, cranial nerves grossly intact. Moves all 4 extremities w/o difficulty. Affect pleasant.  ASSESSMENT & PLAN: 1. H/O PEA arrest: Due to severe hypoxemia in 5/20 in setting of CHF exacerbation.   2. Chronic systolic CHF: Suspect ischemic cardiomyopathy.  Medtronic ICD.  Echo in 1/20 with EF 40-45%, RV moderately dilated, PASP 71 mmHg.  Echo in 5/20 with lower EF, 30-35%, and mildly dilated RV.  Suspect significant RV failure, may be related to OHS/OSA and COPD. She was massively volume overloaded during 5/20 admission and diuresed extensively.  RHC done 11/06/18 showed high cardiac output and minimally elevated filling pressures after diuresis.  PVR was not elevated, appeared to be high output PH.  RHC was done again in 9/20, filling pressures were elevated with preserved cardiac output.  Echo in 10/20 showed EF still  30-35%.  CHF has been complicated by cardiorenal syndrome.  Weight is stable.  She is not volume overloaded on exam or by OptiVol.   Chronic NYHA class III symptoms. Await echo results from today. - Has been off metolazone for months (previously on weekly dosing). OK to add back if needed. - Continue torsemide 100 mg bid.  BMET/BNP today. - Continue Jardiance 10 mg daily. No GU symtpoms.    - Continue Bidil 1 tab tid.   - Continue spironolactone 25 mg daily. - Continue Entresto 24/26 mg bid.  - Continue bisoprolol 5 mg daily (more beta-1 selective).  - She is enrolled in monthly ICM for volume management. - Continue paramedicine. 3. CKD: Stage III. She sees Dr. Royce Macadamia. BMET today. 4. COPD: On home oxygen at baseline.  Has quit smoking.  5. OHS/OSA: On home oxygen at all times and wears trilogy. She is followed by Pulmonary. 6. CAD: History of PCI, most recently had inferior STEMI in 5/15 with DES to RCA.  With fall in EF this year, coronary  angiography was done in 9/20.  There was 75% ostial D1 stenosis but no good target for PCI. No chest pain. Will update echo, if EF further reduced, would likely not pursue cath with CKD, unless in ACS.  - Continue ASA 81 and Crestor 40 daily. Check lipids today.  7. Remote PE: She has not been anticoagulated.  8. Anemia: History of GI bleeding, had endoscopies earlier in 2021 with no bleeding source found.  Also with history of B12 deficiency. She has seen Dr. Lorenso Courier for this. 9. Pulmonary hypertension: Pulmonary venous hypertension on last RHC in 9/20.  10. Obesity: Body mass index is 52.69 kg/m. We discussed diet and exercise.  - She is now on semaglutide. 11. SDOH:  Continue paramedicine. - I signed and faxed paperwork for [employer] low income assistance.   Follow up in 2 months with Dr. Aundra Dubin as scheduled.  Signed, Rafael Bihari, FNP  07/28/2021  [facility] [address] Heart and [place] [address] [phone] (office) [phone] (fax)

## 2021-07-28 NOTE — Patient Instructions (Signed)
Medication Changes:  none  Lab Work:  Labs done today, your results will be available in MyChart, we will contact you for abnormal readings.   Testing/Procedures:  None   Referrals:  none  Special Instructions // Education:  none  Follow-Up in: Keep Appointment with Dr. Aundra Dubin for April 5,2023@10 :40  At the [facility], you and your health needs are our priority. We have a designated team specialized in the treatment of Heart Failure. This Care Team includes your primary Heart Failure Specialized Cardiologist (physician), Advanced Practice Providers (APPs- Physician Assistants and Nurse Practitioners), and Pharmacist who all work together to provide you with the care you need, when you need it.   You may see any of the following providers on your designated Care Team at your next follow up:  Dr Glori Bickers Dr Haynes Kerns, NP Lyda Jester, [place] [facility] [place], [place] Audry Riles, PharmD   Please be sure to bring in all your medications bottles to every appointment.   Need to Contact us:  If you have any questions or concerns before your next appointment please send [place] a message through [facility] [place] or call our office at [phone].    TO LEAVE A MESSAGE FOR THE NURSE SELECT OPTION 2, PLEASE LEAVE A MESSAGE INCLUDING: YOUR NAME DATE OF BIRTH CALL BACK NUMBER REASON FOR CALL**this is important as we prioritize the call backs  YOU WILL RECEIVE A CALL BACK THE SAME DAY AS LONG AS YOU CALL BEFORE 4:00 PM

## 2021-07-29 ENCOUNTER — Other Ambulatory Visit: Payer: Self-pay

## 2021-07-29 ENCOUNTER — Other Ambulatory Visit (INDEPENDENT_AMBULATORY_CARE_PROVIDER_SITE_OTHER): Payer: Medicaid Other

## 2021-07-29 ENCOUNTER — Telehealth: Payer: Self-pay

## 2021-07-29 ENCOUNTER — Other Ambulatory Visit (HOSPITAL_COMMUNITY): Payer: Self-pay

## 2021-07-29 DIAGNOSIS — E1165 Type 2 diabetes mellitus with hyperglycemia: Secondary | ICD-10-CM | POA: Diagnosis not present

## 2021-07-29 DIAGNOSIS — Z794 Long term (current) use of insulin: Secondary | ICD-10-CM

## 2021-07-29 LAB — BASIC METABOLIC PANEL
BUN: 58 mg/dL — ABNORMAL HIGH (ref 6–23)
CO2: 29 mEq/L (ref 19–32)
Calcium: 9.5 mg/dL (ref 8.4–10.5)
Chloride: 99 mEq/L (ref 96–112)
Creatinine, Ser: 2.33 mg/dL — ABNORMAL HIGH (ref 0.40–1.20)
GFR: 22 mL/min — ABNORMAL LOW (ref >60.00)
Glucose, Bld: 117 mg/dL — ABNORMAL HIGH (ref 70–99)
Potassium: 4.4 mEq/L (ref 3.5–5.1)
Sodium: 137 mEq/L (ref 135–145)

## 2021-07-29 LAB — HEMOGLOBIN A1C: Hgb A1c MFr Bld: 7.5 % — ABNORMAL HIGH (ref 4.6–6.5)

## 2021-07-29 MED ORDER — SEMAGLUTIDE (2 MG/DOSE) 8 MG/3ML ~~LOC~~ SOPN: 2.0000 mg | PEN_INJECTOR | SUBCUTANEOUS | 0 refills | Status: DC

## 2021-07-29 NOTE — Telephone Encounter (Signed)
Patient needs PA done for [facility] 2. Can we please start this? ?

## 2021-07-29 NOTE — Telephone Encounter (Signed)
Patient is completely out of ozempic and wants to know about dose change or what she should do. She is suppose to take another injection on Friday. ?

## 2021-07-29 NOTE — Telephone Encounter (Signed)
Called and spoke with patient to let her know that I had her forms and that I was going to fax them to [facility] [employer]. Forms have been faxed to [phone] and [phone]. Nothing further needed at this time.  ?

## 2021-07-30 ENCOUNTER — Other Ambulatory Visit: Payer: Self-pay

## 2021-07-30 DIAGNOSIS — E1165 Type 2 diabetes mellitus with hyperglycemia: Secondary | ICD-10-CM

## 2021-07-30 MED ORDER — FREESTYLE LIBRE 2 SENSOR MISC: 3 refills | Status: DC

## 2021-07-30 NOTE — Telephone Encounter (Signed)
Rx sent to [facility] ?

## 2021-07-31 ENCOUNTER — Other Ambulatory Visit: Payer: Self-pay | Admitting: Endocrinology

## 2021-07-31 DIAGNOSIS — Z794 Long term (current) use of insulin: Secondary | ICD-10-CM

## 2021-07-31 DIAGNOSIS — E1165 Type 2 diabetes mellitus with hyperglycemia: Secondary | ICD-10-CM

## 2021-08-04 ENCOUNTER — Ambulatory Visit: Payer: Medicaid Other | Admitting: Endocrinology

## 2021-08-10 ENCOUNTER — Ambulatory Visit (INDEPENDENT_AMBULATORY_CARE_PROVIDER_SITE_OTHER): Payer: Medicaid Other | Admitting: Endocrinology

## 2021-08-10 ENCOUNTER — Other Ambulatory Visit: Payer: Self-pay

## 2021-08-10 ENCOUNTER — Encounter: Payer: Self-pay | Admitting: Endocrinology

## 2021-08-10 VITALS — BP 118/78 | HR 88 | Ht 67.0 in | Wt 343.2 lb

## 2021-08-10 DIAGNOSIS — Z794 Long term (current) use of insulin: Secondary | ICD-10-CM

## 2021-08-10 DIAGNOSIS — E782 Mixed hyperlipidemia: Secondary | ICD-10-CM | POA: Diagnosis not present

## 2021-08-10 DIAGNOSIS — E1165 Type 2 diabetes mellitus with hyperglycemia: Secondary | ICD-10-CM

## 2021-08-10 NOTE — Progress Notes (Signed)
Patient ID: Tamara Silva, female   DOB: 1961-02-01, 61 y.o.   MRN: [id number]          Reason for Appointment: Follow-up visit for Type 2 Diabetes  Referring PCP: Dr. Karle Plumber   History of Present Illness:          Date of diagnosis of type 2 diabetes mellitus: 2000       Background history:  She thinks her blood sugar was over 600 at the time of diagnosis and was started on insulin from the start Also she took Metformin for couple of years but she thinks it was stopped because it did not work Her diabetes history is only available since about 2016 in her chart She has been mostly on Levemir insulin Appears to have had sporadic follow-up with only 4 or 5 A1c results available in the last 4 years  Recent history:   Her A1c is now 7.5 compared to 9.7 last year  INSULIN regimen is: [place] 80 units daily, NovoLog 20-24 units as needed  Non-insulin hypoglycemic drugs the patient is taking are: Ozempic 2 mg weekly, Jardiance 10 mg daily  She has not been seen in follow-up since 10/22  Current management, blood sugar patterns and problems identified: She is having much better blood sugar control since her last visit with continuing Ozempic On her last visit her blood sugars were averaging 216 on her Accu-Chek She is now using the freestyle libre consistently although because of the sensor falling off has only 41% active time now However currently she is about 91% of her blood sugars within target She thinks she has been able to control her portions and plan her meals and drinks much better with starting Ozempic She has lost 11 pounds Although she is having some tendency to low sugars early morning she has not changed her basal insulin Also today her blood sugar is only 77 even without having her Tyler Aas this morning She has just taken her second injection of 2 mg Ozempic, previously on 1 mg She is also taking Jardiance 10 mg daily which is primarily for renal benefits since  10/22 Also appears that she is taking NovoLog only as needed when blood sugars go up and unlikely that she is injecting it before eating She did not bring her monitor which she uses when the sensor is not working Lab glucose 117 late morning Not able to do any exercise, she comes in a wheelchair         Side effects from medications have been: None     Meal times are: Dinner: 6-7 PM             Data from [employer] is analyzed as follows: Patient thinks sensor glucose is similar to her fingersticks  HYPERGLYCEMIA is noted to occasionally early morning around 4-5 AM and periodically mild increase after lunch or late evening HYPOGLYCEMIC episodes have been noted around 7 AM, 3 PM or 6 PM and usually transient with lowest blood sugar 57 and 54 OVERNIGHT blood sugars are somewhat variable and at times at the high end in the target range Not clear what time she is having her meals but appears to be periodically having higher readings after lunch she has only occasionally running late evening or after 3-4 AM LOWEST blood sugar is an average of 102 from 6-8 PM  CGM use % of time 41  2-week average/GV 121/28  Time in range      91%  % Time  Above 180 3  % Time above 250   % Time Below 70 6     PRE-MEAL Fasting Lunch Dinner Bedtime Overall  Glucose range:       Averages: 116    121   POST-MEAL PC Breakfast PC Lunch PC Dinner  Glucose range:     Averages:  136 119      Previous blood Glucose readings by Accu-Chek download:   blood sugar patterns are as follows Blood sugars are being monitored mostly late at night although time on the monitor may not be accurate HIGHEST blood sugars overall are either in the mornings or early evenings She has significant variability and will need some good readings below 180 late at night/mornings  PRE-MEAL Fasting Lunch Dinner hs Overall  Glucose range:     80-386  Mean/median: 233 182 220 216 216   Prior  PRE-MEAL Fasting Lunch Dinner  Bedtime Overall  Glucose range: 102-200 180 145 ?   Mean/median:        Weight history:  Wt Readings from Last 3 Encounters:  08/10/21 (!) 343 lb 3.2 oz (155.7 kg)  07/28/21 (!) 336 lb 6.4 oz (152.6 kg)  07/02/21 (!) 349 lb (158.3 kg)    Glycemic control:   Lab Results  Component Value Date   HGBA1C 7.5 (H) 07/29/2021   HGBA1C 9.7 (A) 02/17/2021   HGBA1C 7.4 (A) 10/22/2019   Lab Results  Component Value Date   MICROALBUR 4.3 (H) 02/17/2021   LDLCALC 55 07/28/2021   CREATININE 2.33 (H) 07/29/2021   Lab Results  Component Value Date   MICRALBCREAT 7.7 02/17/2021    Lab Results  Component Value Date   FRUCTOSAMINE 261 05/08/2019    No visits with results within 1 Week(s) from this visit.  Latest known visit with results is:  Lab on 07/29/2021  Component Date Value Ref Range Status   Hgb A1c MFr Bld 07/29/2021 7.5 (H)  4.6 - 6.5 % Final   Glycemic Control Guidelines for People with Diabetes:Non Diabetic:  <6%Goal of Therapy: <7%Additional Action Suggested:  >8%    Sodium 07/29/2021 137  135 - 145 mEq/L Final   Potassium 07/29/2021 4.4  3.5 - 5.1 mEq/L Final   Chloride 07/29/2021 99  96 - 112 mEq/L Final   CO2 07/29/2021 29  19 - 32 mEq/L Final   Glucose, Bld 07/29/2021 117 (H)  70 - 99 mg/dL Final   BUN 07/29/2021 58 (H)  6 - 23 mg/dL Final   Creatinine, Ser 07/29/2021 2.33 (H)  0.40 - 1.20 mg/dL Final   GFR 07/29/2021 22.00 (L)  >60.00 mL/min Final   Calculated using the CKD-EPI Creatinine Equation (2021)   Calcium 07/29/2021 9.5  8.4 - 10.5 mg/dL Final    Allergies as of 08/10/2021       Reactions   Metolazone Other (See Comments)   Dizziness and falling Per patient in 2021, states she does not think she fell because of this medication.   Plavix [clopidogrel Bisulfate] Other (See Comments)   High PRU's - non-responder   Ibuprofen Other (See Comments)   Pt states she is not supposed to take ibuprofen because of other meds she is taking   Nsaids Other (See  Comments)   "I do not take NSAIDs d/t interfering with other meds"        Medication List        Accurate as of August 10, 2021  8:57 PM. If you have any questions, ask your nurse  or doctor.          Accu-Chek Aviva Plus test strip Generic drug: glucose blood USE 1 STRIP TO CHECK BLOOD SUGAR 3 TIMES A DAY .   Accu-Chek Guide w/Device Kit Use accu chek guide to check blood sugar three times daily.   acetaminophen 500 MG tablet Commonly known as: TYLENOL Take 500 mg by mouth every 6 (six) hours as needed for moderate pain or headache.   Anoro Ellipta 62.5-25 MCG/ACT Aepb Generic drug: umeclidinium-vilanterol INHALE 1 PUFF INTO THE LUNGS DAILY.   ascorbic acid 500 MG tablet Commonly known as: VITAMIN C TAKE ONE TABLET BY MOUTH ONCE DAILY   Aspirin Low Dose 81 MG EC tablet Generic drug: aspirin CHEW 1 TABLET (81 MG TOTAL) BY MOUTH DAILY.   BiDil 20-37.5 MG tablet Generic drug: isosorbide-hydrALAZINE TAKE 1 TABLET BY MOUTH 3 (THREE) TIMES DAILY.   bisoprolol 5 MG tablet Commonly known as: ZEBETA TAKE 1 TABLET (5 MG TOTAL) BY MOUTH DAILY.   D3-1000 25 MCG (1000 UT) tablet Generic drug: Cholecalciferol Take 1,000 Units by mouth 2 (two) times daily.   Easy Comfort Pen Needles 31G X 5 MM Misc Generic drug: Insulin Pen Needle USE 3 TIMES A DAY FOR INSULIN ADMINISTRATION   Entresto 24-26 MG Generic drug: sacubitril-valsartan Take 1 tablet by mouth 2 (two) times daily.   FeroSul 325 (65 FE) MG tablet Generic drug: ferrous sulfate TAKE 1 TABLET (325 MG TOTAL) BY MOUTH DAILY AFTER SUPPER. PLEASE TAKE WITH A SOURCE OF VITAMIN C   fluticasone 50 MCG/ACT nasal spray Commonly known as: FLONASE PLACE 1 SPRAY INTO BOTH NOSTRILS DAILY.   FreeStyle Libre 2 Reader [place] Use to check blood sugar daily.   FreeStyle Libre 2 Sensor Misc USE EVERY 14 (FOURTEEN) DAYS.   hydrocerin Crea Apply 1 application topically 2 (two) times daily.   ipratropium-albuterol 0.5-2.5 (3)  MG/3ML Soln Commonly known as: DUONEB INHALE CONTENTS OF ONE VIAL USING NEBULIZER EVERY 6 HOURS AS NEEDED FOR SHORTNESS OF BREATH, WHEEZING   Jardiance 10 MG Tabs tablet Generic drug: empagliflozin TAKE ONE TABLET BY MOUTH ONCE DAILY BEFORE BREAKFAST   NEEDLE (DISP) 26 G 26G X 1/2" Misc 1 application by Does not apply route daily.   NovoLOG FlexPen 100 UNIT/ML FlexPen Generic drug: insulin aspart Inject 20 units under the skin at breakfast and lunch, and 24 units at dinner.   omeprazole 40 MG capsule Commonly known as: PRILOSEC Take 1 capsule (40 mg total) by mouth daily.   potassium chloride SA 20 MEQ tablet Commonly known as: KLOR-CON M TAKE 2 TABLETS (40 MEQ TOTAL) BY MOUTH DAILY.   ProAir HFA 108 (90 Base) MCG/ACT inhaler Generic drug: albuterol Inhale 1-2 puffs into the lungs every 4 (four) hours as needed for wheezing or shortness of breath.   albuterol (5 MG/ML) 0.5% nebulizer solution Commonly known as: PROVENTIL Take 0.5 mLs (2.5 mg total) by nebulization every 6 (six) hours as needed for wheezing or shortness of breath.   Restasis 0.05 % ophthalmic emulsion Generic drug: cycloSPORINE Place 1 drop into both eyes daily as needed.   rosuvastatin 40 MG tablet Commonly known as: CRESTOR TAKE 1 TABLET (40 MG TOTAL) BY MOUTH DAILY AT 6 PM.   Semaglutide (2 MG/DOSE) 8 MG/3ML Sopn Inject 2 mg as directed once a week.   spironolactone 25 MG tablet Commonly known as: ALDACTONE Take 1 tablet by mouth daily.   Stiolto Respimat 2.5-2.5 MCG/ACT Aers Generic drug: Tiotropium Bromide-Olodaterol Inhale 2 puffs into the  lungs daily.   torsemide 100 MG tablet Commonly known as: DEMADEX TAKE 1 TABLET (100 MG TOTAL) BY MOUTH 2 (TWO) TIMES DAILY.   traMADol 50 MG tablet Commonly known as: [place] FlexTouch 200 UNIT/ML FlexTouch Pen Generic drug: insulin degludec INJECT 80 UNITS UNDER THE SKIN ONCE DAILY   vitamin B-12 1000 MCG tablet Commonly known as:  CYANOCOBALAMIN Take 1,000 mcg by mouth daily.   Vitamin D (Ergocalciferol) 1.25 MG (50000 UNIT) Caps capsule Commonly known as: DRISDOL Take 50,000 Units by mouth once a week.        Allergies:  Allergies  Allergen Reactions   Metolazone Other (See Comments)    Dizziness and falling Per patient in 2021, states she does not think she fell because of this medication.   Plavix [Clopidogrel Bisulfate] Other (See Comments)    High PRU's - non-responder   Ibuprofen Other (See Comments)    Pt states she is not supposed to take ibuprofen because of other meds she is taking   Nsaids Other (See Comments)    "I do not take NSAIDs d/t interfering with other meds"    Past Medical History:  Diagnosis Date   AICD (automatic cardioverter/defibrillator) present 04/04/2019   Asthma    CAD (coronary artery disease)    Chronic combined systolic (congestive) and diastolic (congestive) heart failure (HCC)    Chronic iron deficiency anemia    CVA (cerebral vascular accident) ([place]) 05/2018   Diabetes mellitus without complication ([place])    DOE (dyspnea on exertion)    Edema of both legs    Hepatic steatosis    Hypertension    Pulmonary emboli ([place]) 2014   Respiratory failure ([place]) 09/2018   Seizure ([place]) 05/2018    Past Surgical History:  Procedure Laterality Date   BIOPSY  07/10/2018   Procedure: BIOPSY;  Surgeon: [name], MD;  Location: [facility];  Service: Gastroenterology;;   COLONOSCOPY WITH PROPOFOL N/A 08/03/2013   Procedure: COLONOSCOPY WITH PROPOFOL;  Surgeon: Jeryl Columbia, MD;  Location: [facility];  Service: Endoscopy;  Laterality: N/A;   COLONOSCOPY WITH PROPOFOL N/A 07/10/2018   Procedure: COLONOSCOPY WITH PROPOFOL;  Surgeon: [name], MD;  Location: [facility];  Service: Gastroenterology;  Laterality: N/A;   CORONARY ANGIOPLASTY WITH STENT PLACEMENT     CAD in 2006 x 2 and 2009 2 more- place din REx in [place] and [place] med   CORONARY ANGIOPLASTY WITH STENT  PLACEMENT  10/07/2013   Xience [place] DES 2.75  mm x 15  mm   ESOPHAGOGASTRODUODENOSCOPY (EGD) WITH PROPOFOL N/A 08/03/2013   Procedure: ESOPHAGOGASTRODUODENOSCOPY (EGD) WITH PROPOFOL;  Surgeon: Jeryl Columbia, MD;  [facility];  Service: Endoscopy;  Laterality: N/A;   ESOPHAGOGASTRODUODENOSCOPY (EGD) WITH PROPOFOL N/A 07/10/2018   Procedure: ESOPHAGOGASTRODUODENOSCOPY (EGD) WITH PROPOFOL;  Surgeon: [name], MD;  Location: [facility];  Service: Gastroenterology;  Laterality: N/A;   ICD IMPLANT  04/04/2019   ICD IMPLANT N/A 04/04/2019   Procedure: ICD IMPLANT;  Surgeon: Constance Haw, MD;  Location: [facility];  Service: Cardiovascular;  Laterality: N/A;   LEFT HEART CATHETERIZATION WITH CORONARY ANGIOGRAM N/A 10/07/2013   Procedure: LEFT HEART CATHETERIZATION WITH CORONARY ANGIOGRAM;  Surgeon: Leonie Man, MD;  Location[facility];  Service: Cardiovascular;  Laterality: N/A;   RIGHT HEART CATH N/A 11/06/2018   Procedure: RIGHT HEART CATH;  Surgeon: Larey Dresser, MD;  Location: [facility];  Service: Cardiovascular;  Laterality: N/A;  RIGHT/LEFT HEART CATH AND CORONARY ANGIOGRAPHY N/A 02/12/2019   Procedure: RIGHT/LEFT HEART CATH AND CORONARY ANGIOGRAPHY;  Surgeon: Larey Dresser, MD;  Location: [facility];  Service: Cardiovascular;  Laterality: N/A;    Family History  Problem Relation Age of Onset   Stroke Mother    Hypertension Mother    Colon cancer Father    Diabetes Father    Heart attack Father    Sarcoidosis Other    Breast cancer Paternal Aunt    Emphysema Brother    Lung disease Brother        Unknown type, 3 brothers, one with liver and lung disease   Diabetes Brother    Asthma Paternal Aunt    Diabetes Sister     Social History:  reports that she quit smoking about 3 years ago. Her smoking use included cigarettes. She has a 10.00 pack-year smoking history. She has never used smokeless tobacco. She reports that she does not  currently use drugs after having used the following drugs: Cocaine. She reports that she does not drink alcohol.   Review of Systems  TSH was abnormal in 5/20 but subsequently normal   Lab Results  Component Value Date   TSH 4.26 02/17/2021   TSH 3.46 10/22/2019   TSH 3.11 05/08/2019   FREET4 0.83 05/08/2019     Lipid history: Has been treated adequately with Crestor 40 mg daily, has history of CAD    Lab Results  Component Value Date   CHOL 128 07/28/2021   HDL 58 07/28/2021   LDLCALC 55 07/28/2021   TRIG 74 07/28/2021   CHOLHDL 2.2 07/28/2021           Hypertension: Treated with bisoprolol, also CHF has been present CHF treated with spironolactone and torsemide as well as isosorbide/hydralazine  BP Readings from Last 3 Encounters:  08/10/21 118/78  07/28/21 110/60  07/02/21 108/64   She has chronic renal insufficiency not related to diabetes,  followed by nephrologist     Lab Results  Component Value Date   CREATININE 2.33 (H) 07/29/2021   CREATININE 2.20 (H) 07/28/2021   CREATININE 2.32 (H) 06/04/2021     Most recent foot exam: 9/22  Currently known complications of diabetes: Neuropathy, microalbuminuria Neuropathic symptoms mostly controlled with gabapentin  Other active problems are history of coronary artery disease, cardiomyopathy and COPD She is using oxygen  LABS:  No visits with results within 1 Week(s) from this visit.  Latest known visit with results is:  Lab on 07/29/2021  Component Date Value Ref Range Status   Hgb A1c MFr Bld 07/29/2021 7.5 (H)  4.6 - 6.5 % Final   Glycemic Control Guidelines for People with Diabetes:Non Diabetic:  <6%Goal of Therapy: <7%Additional Action Suggested:  >8%    Sodium 07/29/2021 137  135 - 145 mEq/L Final   Potassium 07/29/2021 4.4  3.5 - 5.1 mEq/L Final   Chloride 07/29/2021 99  96 - 112 mEq/L Final   CO2 07/29/2021 29  19 - 32 mEq/L Final   Glucose, Bld 07/29/2021 117 (H)  70 - 99 mg/dL Final   BUN  07/29/2021 58 (H)  6 - 23 mg/dL Final   Creatinine, Ser 07/29/2021 2.33 (H)  0.40 - 1.20 mg/dL Final   GFR 07/29/2021 22.00 (L)  >60.00 mL/min Final   Calculated using the CKD-EPI Creatinine Equation (2021)   Calcium 07/29/2021 9.5  8.4 - 10.5 mg/dL Final    Physical Examination:     BP 118/78  Pulse 88    Ht 5' 7"  (1.702 m)    Wt (!) 343 lb 3.2 oz (155.7 kg)    LMP 07/28/2013 Comment: perimenopausal   SpO2 97%    BMI 53.75 kg/m     ASSESSMENT:  Diabetes type 2 on insulin with complications  See history of present illness for detailed discussion of current diabetes management, blood sugar patterns and problems identified  A1c is significantly improved at 7.5  She has benefited tremendously from adding Ozempic Now with recently having 1 mg Ozempic she has had blood sugars averaging only about 120 overall and not requiring mealtime insulin However she appears to be still getting excessive amount of basal insulin and tendency to hypoglycemia at 6% of her readings below 70 recently Also patient feels that she is benefiting from using CGM which appears to be relatively accurate as per patient comparison She has benefited from [place] with ability to control portions and also is watching her foods and types of drinks she is having Although she has lost 11 pounds she still has significant obesity  Hypercholesterolemia: Controlled with Crestor  CKD: She is currently taking Jardiance 10 mg daily with stable renal functions  PLAN:    She will try to use her freestyle libre more consistently, she can call the 800-number if she has difficulty with using the sensor or the staying in place She will continue Ozempic 2 mg weekly since she is so far tolerating it  REDUCE Tresiba to 60 units  Given her titration sheet to help adjust her Tyler Aas on her own and this was discussed in detail Showed her how to enter her fasting blood sugar She will adjust her Tyler Aas dose every 3 days if blood  sugars are either below 90 or over 130 Biofactor of 5 units She will completely stop NOVOLOG insulin  Continue restricting carbohydrates, portions and fat Avoid sweet drinks completely She will let us know if she has any further difficulties with the freestyle libre Does need much more regular follow-up  Continue same dose of Crestor  There are no Patient Instructions on file for this visit.         Elayne Snare 08/10/2021, 8:57 PM   Note: This office note was prepared with Dragon voice recognition system technology. Any transcriptional errors that result from this process are unintentional.

## 2021-08-13 ENCOUNTER — Encounter: Payer: Self-pay | Admitting: Endocrinology

## 2021-08-18 ENCOUNTER — Other Ambulatory Visit (HOSPITAL_COMMUNITY): Payer: Self-pay | Admitting: Cardiology

## 2021-08-18 ENCOUNTER — Telehealth: Payer: Self-pay

## 2021-08-18 ENCOUNTER — Telehealth (HOSPITAL_COMMUNITY): Payer: Self-pay

## 2021-08-18 DIAGNOSIS — K219 Gastro-esophageal reflux disease without esophagitis: Secondary | ICD-10-CM

## 2021-08-18 NOTE — Telephone Encounter (Signed)
Patient would like another referral to a different Eye Dr. Dr Delman Cheadle office and not answering or returning calls. Patient states she needs new glasses ?

## 2021-08-19 NOTE — Telephone Encounter (Signed)
error 

## 2021-08-19 NOTE — Telephone Encounter (Signed)
Attempted to reach pt to sch home visit.  ?No answer.  ? ?Will try again next week.  ? ?Marylouise Stacks, Leigh  ?[phone] ?08/19/2021 ? ? ?

## 2021-08-24 ENCOUNTER — Other Ambulatory Visit: Payer: Self-pay | Admitting: Endocrinology

## 2021-08-24 ENCOUNTER — Ambulatory Visit (INDEPENDENT_AMBULATORY_CARE_PROVIDER_SITE_OTHER): Payer: Medicaid Other

## 2021-08-24 ENCOUNTER — Telehealth: Payer: Self-pay

## 2021-08-24 DIAGNOSIS — E1165 Type 2 diabetes mellitus with hyperglycemia: Secondary | ICD-10-CM

## 2021-08-24 DIAGNOSIS — Z95810 Presence of automatic (implantable) cardiac defibrillator: Secondary | ICD-10-CM

## 2021-08-24 DIAGNOSIS — I5022 Chronic systolic (congestive) heart failure: Secondary | ICD-10-CM

## 2021-08-24 NOTE — Telephone Encounter (Signed)
Remote ICM transmission received.  Attempted call to patient regarding ICM remote transmission and left message per DPR to return call.   

## 2021-08-24 NOTE — Progress Notes (Signed)
EPIC Encounter for ICM Monitoring ? ?Patient Name: Tamara Silva is a 61 y.o. female ?Date: 08/24/2021 ?Primary Care Physican: de [place], Blondell Reveal, MD ?Primary Cardiologist: Aundra Dubin ?Electrophysiologist: Camnitz ?08/24/2021 Weight:  336 lbs ?                                                         ?  ?Spoke with patient and heart failure questions reviewed.  Pt reports minimal shortness of breath.  Reports feeling well at this time and voices no complaints.  ?  ?Optivol thoracic impedance suggesting possible fluid accumulation since 3/9. ?  ?Prescribed:  ?Torsemide 100 mg take 1 tablet (100 mg total) by mouth twice a day.  08/24/21 Pt reports taking Torsemide 100 mg 1 tablet daily instead of twice daily due to it effects her sleep.  ?Potassium 20 mEq take 2 tablet (40 mEq total) by mouth daily. ?Spironolactone 25 mg take 1 tablet daily ?Jardiance 10 mg take 1 tablet daily before breakfast ?  ?Recommendations:  Advised to take Torsemide 100 mg twice a day as prescribed.  She will try to take it twice a day for the next couple of days.   ?  ?Follow-up plan: ICM clinic phone appointment on 09/08/2021 to recheck fluid levels (fluid levels will be rechecked in office 4/5).   91 day device clinic remote transmission 10/20/2021.   ?  ?EP/Cardiology Office Visits: 09/02/2021 with Dr Aundra Dubin as Juluis Rainier. ?  ?Copy of ICM check sent to Dr. Curt Bears.   ? ?3 month ICM trend: 08/24/2021. ? ? ? ?12-14 Month ICM trend:  ? ? ? ?[name], RN ?08/24/2021 ?10:04 AM ? ?

## 2021-08-25 ENCOUNTER — Encounter: Payer: Medicaid Other | Attending: Endocrinology | Admitting: Nutrition

## 2021-08-25 ENCOUNTER — Other Ambulatory Visit: Payer: Self-pay

## 2021-08-25 ENCOUNTER — Other Ambulatory Visit: Payer: Self-pay | Admitting: Endocrinology

## 2021-08-25 DIAGNOSIS — Z794 Long term (current) use of insulin: Secondary | ICD-10-CM | POA: Insufficient documentation

## 2021-08-25 DIAGNOSIS — I25119 Atherosclerotic heart disease of native coronary artery with unspecified angina pectoris: Secondary | ICD-10-CM

## 2021-08-25 DIAGNOSIS — E1169 Type 2 diabetes mellitus with other specified complication: Secondary | ICD-10-CM

## 2021-08-25 DIAGNOSIS — Z713 Dietary counseling and surveillance: Secondary | ICD-10-CM | POA: Diagnosis not present

## 2021-08-25 DIAGNOSIS — E1165 Type 2 diabetes mellitus with hyperglycemia: Secondary | ICD-10-CM | POA: Diagnosis present

## 2021-08-25 NOTE — Assessment & Plan Note (Signed)
Inconsistent control of blood sugar over the years, emphasized importance of regular follow-ups with endocrinology and adherence to medication and treatment plan ?

## 2021-08-25 NOTE — Assessment & Plan Note (Signed)
Recommend continued regular follow-up with cardiology.  No medication changes today ?Recommend intermittent monitoring of blood pressure at home ?Recommend DASH diet ?

## 2021-08-25 NOTE — Assessment & Plan Note (Signed)
History of diabetic retinopathy, needs follow-up with ophthalmology, referral placed today ?

## 2021-08-25 NOTE — Assessment & Plan Note (Signed)
Continue with current inhalers as prescribed by pulmonology, continue with close follow-up with pulmonologist for monitoring and adjustment of medications as needed ?

## 2021-08-26 ENCOUNTER — Other Ambulatory Visit (HOSPITAL_COMMUNITY): Payer: Self-pay

## 2021-08-26 ENCOUNTER — Telehealth (HOSPITAL_COMMUNITY): Payer: Self-pay | Admitting: Pharmacy Technician

## 2021-08-26 ENCOUNTER — Ambulatory Visit (INDEPENDENT_AMBULATORY_CARE_PROVIDER_SITE_OTHER): Payer: Medicaid Other | Admitting: Family Medicine

## 2021-08-26 VITALS — BP 104/58 | HR 64 | Temp 97.8°F | Ht 67.5 in | Wt 336.5 lb

## 2021-08-26 DIAGNOSIS — N184 Chronic kidney disease, stage 4 (severe): Secondary | ICD-10-CM

## 2021-08-26 DIAGNOSIS — E1122 Type 2 diabetes mellitus with diabetic chronic kidney disease: Secondary | ICD-10-CM

## 2021-08-26 DIAGNOSIS — I10 Essential (primary) hypertension: Secondary | ICD-10-CM

## 2021-08-26 DIAGNOSIS — Z794 Long term (current) use of insulin: Secondary | ICD-10-CM

## 2021-08-26 NOTE — Telephone Encounter (Signed)
Patient Advocate Encounter ?  ?Received notification from [facility] that prior authorization for Bisoprolol is required. ?  ?PA submitted on CoverMyMeds ?Key [id number] ?Status is pending ?  ?Will continue to follow. ? ?

## 2021-08-26 NOTE — Progress Notes (Signed)
? ? ?  Procedures performed today:   ? ?None. ? ?Independent interpretation of notes and tests performed by another provider:  ? ?None. ? ?Brief History, Exam, Impression, and Recommendations:   ? ?BP (!) 104/58   Pulse 64   Temp 97.8 ?F (36.6 ?C)   Ht 5' 7.5" (1.715 m)   Wt (!) 336 lb 8 oz (152.6 kg)   LMP 07/28/2013 Comment: perimenopausal  SpO2 96%   BMI 51.93 kg/m?  ? ?Essential hypertension ?Blood pressure a bit low in office today, however patient without any symptoms such as lightheadedness or dizziness.  Patient also with heart failure, does follow with cardiology.  Appears that she is doing fairly well with current medication regimen ?Recommend intermittent monitoring of blood pressure at home, DASH diet, regular follow-up with cardiology ? ?Type II diabetes mellitus with renal manifestations ([place]) ?Patient continues to follow with endocrinology, has had improvement in A1c recently with addition of Ozempic.  She has been tolerating this medication well.  She also continues with [place], NovoLog, [place].  Denies any issues at present related to this. ?Recent medication change earlier this month with endocrinology includes decreasing dose of Tresiba.  Patient has been tolerating this change ?Recommend continued follow-up with endocrinology as scheduled ? ?Plan for follow-up in about 3-4 months or sooner as needed ? ? ?___________________________________________ ?Raji Glinski [place], MD, ABFM, CAQSM ?Primary Care and Sports Medicine ?[place] ?

## 2021-08-26 NOTE — Patient Instructions (Signed)
?  Medication Instructions:  ?Your physician recommends that you continue on your current medications as directed. Please refer to the Current Medication list given to you today. ?--If you need a refill on any your medications before your next appointment, please call your pharmacy first. If no refills are authorized on file call the office.-- ? ? ? ?Follow-Up: ?Your next appointment:   ?Your physician recommends that you schedule a follow-up appointment in: 4 month follow up with Dr. Tennis Must [place] ? ?You will receive a text message or e-mail with a link to a survey about your care and experience with [place] today! We would greatly appreciate your feedback!  ? ?Thanks for letting us be apart of your health journey!!  ?Primary Care and Sports Medicine  ? ?Dr. Kyung Rudd de [place]  ? ?We encourage you to activate your patient portal called "MyChart".  Sign up information is provided on this After Visit Summary.  MyChart is used to connect with patients for Virtual Visits (Telemedicine).  Patients are able to view lab/test results, encounter notes, upcoming appointments, etc.  Non-urgent messages can be sent to your provider as well. To learn more about what you can do with MyChart, please visit --  [email or web address].   ? ?

## 2021-08-27 ENCOUNTER — Telehealth (HOSPITAL_COMMUNITY): Payer: Self-pay

## 2021-08-27 ENCOUNTER — Telehealth: Payer: Self-pay | Admitting: Pulmonary Disease

## 2021-08-27 MED ORDER — ALBUTEROL SULFATE (2.5 MG/3ML) 0.083% IN NEBU: 2.5000 mg | INHALATION_SOLUTION | Freq: Four times a day (QID) | RESPIRATORY_TRACT | 5 refills | Status: DC | PRN

## 2021-08-27 NOTE — Telephone Encounter (Signed)
Pt sent me message last night regarding refills on albuterol solutions. I had replied that the pharmacy is suppose to request that to the providers, she then replied that he has tried and not getting any response.  ?I then reached out to pharmacy to get him to request it from another provider that has prescribed her other inhalers.  ?  ?I tried calling pt to explain what is going on, she then got attitude with me and told me to not come back for home visit, which we had sch for today.  ?She then hung up.  ? ? ?She can be d/c from paramedicine. ? ?Patient is now discharged from [facility].  Patient has/has not met the following goals: ? ?Yes :Patient expresses basic understanding of medications and what they are for ?Yes :Patient able to verbalize heart failure specific dietary/fluid restrictions ?Yes :Patient is aware of who to call if they have medical concerns or if they need to schedule or change appts ?Yes :Patient has a scale for daily weights and weighs regularly ?Yes :Patient able to verbalize concerning symptoms when they should call the HF clinic (weight gain ranges, etc) ?Yes :Patient has a PCP and has seen within the past year or has upcoming appt ?Yes :Patient has reliable access to getting their medications ?Yes :Patient has shown they are able to reorder medications reliably ?No :Patient has had admission in past 30 days- if yes how many? ?No :Patient has had admission in past 90 days- if yes how many? ? ?Discharge Comments: ? ?Marylouise Stacks, EMT-Paramedic  ?[phone] ?08/27/2021 ? ? ? ? ?

## 2021-08-27 NOTE — Telephone Encounter (Signed)
Called and spoke to pt. Pt requesting albuterol neb sent to [facility]. Rx has been sent to preferred pharmacy. Pt verbalized understanding and denied any further questions or concerns at this time.  ? ?

## 2021-08-27 NOTE — Telephone Encounter (Signed)
Advanced Heart Failure Patient Advocate Encounter ? ?Prior Authorization for Bisoprolol has been approved.   ? ?PA# [id number] ?Effective dates: 08/26/21 through 08/27/22 ? ?Charlann Boxer, CPhT ? ? ?

## 2021-08-31 LAB — HM DIABETES EYE EXAM

## 2021-09-01 NOTE — Assessment & Plan Note (Signed)
Blood pressure a bit low in office today, however patient without any symptoms such as lightheadedness or dizziness.  Patient also with heart failure, does follow with cardiology.  Appears that she is doing fairly well with current medication regimen ?Recommend intermittent monitoring of blood pressure at home, DASH diet, regular follow-up with cardiology ?

## 2021-09-01 NOTE — Assessment & Plan Note (Signed)
Patient continues to follow with endocrinology, has had improvement in A1c recently with addition of Ozempic.  She has been tolerating this medication well.  She also continues with [place], NovoLog, [place].  Denies any issues at present related to this. ?Recent medication change earlier this month with endocrinology includes decreasing dose of Tresiba.  Patient has been tolerating this change ?Recommend continued follow-up with endocrinology as scheduled ?

## 2021-09-02 ENCOUNTER — Encounter (HOSPITAL_COMMUNITY): Payer: Self-pay | Admitting: Cardiology

## 2021-09-02 ENCOUNTER — Ambulatory Visit (HOSPITAL_COMMUNITY)
Admission: RE | Admit: 2021-09-02 | Discharge: 2021-09-02 | Disposition: A | Discharge status: Home / Self Care | Payer: Medicaid Other | Source: Ambulatory Visit | Attending: Cardiology | Admitting: Cardiology

## 2021-09-02 VITALS — BP 104/50 | HR 68 | Wt 334.0 lb

## 2021-09-02 DIAGNOSIS — I252 Old myocardial infarction: Secondary | ICD-10-CM | POA: Insufficient documentation

## 2021-09-02 DIAGNOSIS — I4891 Unspecified atrial fibrillation: Secondary | ICD-10-CM | POA: Insufficient documentation

## 2021-09-02 DIAGNOSIS — Z8249 Family history of ischemic heart disease and other diseases of the circulatory system: Secondary | ICD-10-CM | POA: Diagnosis not present

## 2021-09-02 DIAGNOSIS — I5022 Chronic systolic (congestive) heart failure: Secondary | ICD-10-CM | POA: Diagnosis not present

## 2021-09-02 DIAGNOSIS — Z79899 Other long term (current) drug therapy: Secondary | ICD-10-CM | POA: Diagnosis not present

## 2021-09-02 DIAGNOSIS — D649 Anemia, unspecified: Secondary | ICD-10-CM | POA: Diagnosis not present

## 2021-09-02 DIAGNOSIS — J302 Other seasonal allergic rhinitis: Secondary | ICD-10-CM

## 2021-09-02 DIAGNOSIS — I2510 Atherosclerotic heart disease of native coronary artery without angina pectoris: Secondary | ICD-10-CM | POA: Insufficient documentation

## 2021-09-02 DIAGNOSIS — G4733 Obstructive sleep apnea (adult) (pediatric): Secondary | ICD-10-CM | POA: Diagnosis not present

## 2021-09-02 DIAGNOSIS — Z794 Long term (current) use of insulin: Secondary | ICD-10-CM | POA: Diagnosis not present

## 2021-09-02 DIAGNOSIS — Z86711 Personal history of pulmonary embolism: Secondary | ICD-10-CM | POA: Insufficient documentation

## 2021-09-02 DIAGNOSIS — Z95810 Presence of automatic (implantable) cardiac defibrillator: Secondary | ICD-10-CM | POA: Diagnosis not present

## 2021-09-02 DIAGNOSIS — J449 Chronic obstructive pulmonary disease, unspecified: Secondary | ICD-10-CM | POA: Insufficient documentation

## 2021-09-02 DIAGNOSIS — N183 Chronic kidney disease, stage 3 unspecified: Secondary | ICD-10-CM | POA: Diagnosis not present

## 2021-09-02 DIAGNOSIS — E785 Hyperlipidemia, unspecified: Secondary | ICD-10-CM | POA: Diagnosis not present

## 2021-09-02 DIAGNOSIS — Z87891 Personal history of nicotine dependence: Secondary | ICD-10-CM | POA: Insufficient documentation

## 2021-09-02 DIAGNOSIS — Z6841 Body Mass Index (BMI) 40.0 and over, adult: Secondary | ICD-10-CM | POA: Insufficient documentation

## 2021-09-02 DIAGNOSIS — E1122 Type 2 diabetes mellitus with diabetic chronic kidney disease: Secondary | ICD-10-CM | POA: Insufficient documentation

## 2021-09-02 DIAGNOSIS — I272 Pulmonary hypertension, unspecified: Secondary | ICD-10-CM | POA: Insufficient documentation

## 2021-09-02 DIAGNOSIS — Z8673 Personal history of transient ischemic attack (TIA), and cerebral infarction without residual deficits: Secondary | ICD-10-CM | POA: Diagnosis not present

## 2021-09-02 DIAGNOSIS — E669 Obesity, unspecified: Secondary | ICD-10-CM | POA: Diagnosis not present

## 2021-09-02 DIAGNOSIS — Z8674 Personal history of sudden cardiac arrest: Secondary | ICD-10-CM | POA: Insufficient documentation

## 2021-09-02 DIAGNOSIS — Z7984 Long term (current) use of oral hypoglycemic drugs: Secondary | ICD-10-CM | POA: Insufficient documentation

## 2021-09-02 DIAGNOSIS — I130 Hypertensive heart and chronic kidney disease with heart failure and stage 1 through stage 4 chronic kidney disease, or unspecified chronic kidney disease: Secondary | ICD-10-CM | POA: Insufficient documentation

## 2021-09-02 DIAGNOSIS — Z833 Family history of diabetes mellitus: Secondary | ICD-10-CM | POA: Insufficient documentation

## 2021-09-02 DIAGNOSIS — Z955 Presence of coronary angioplasty implant and graft: Secondary | ICD-10-CM | POA: Diagnosis not present

## 2021-09-02 DIAGNOSIS — Z9981 Dependence on supplemental oxygen: Secondary | ICD-10-CM | POA: Diagnosis not present

## 2021-09-02 LAB — BASIC METABOLIC PANEL
Anion gap: 8 (ref 5–15)
BUN: 38 mg/dL — ABNORMAL HIGH (ref 8–23)
CO2: 30 mmol/L (ref 22–32)
Calcium: 9.1 mg/dL (ref 8.9–10.3)
Chloride: 103 mmol/L (ref 98–111)
Creatinine, Ser: 2.19 mg/dL — ABNORMAL HIGH (ref 0.44–1.00)
GFR, Estimated: 25 mL/min — ABNORMAL LOW (ref >60)
Glucose, Bld: 132 mg/dL — ABNORMAL HIGH (ref 70–99)
Potassium: 3.8 mmol/L (ref 3.5–5.1)
Sodium: 141 mmol/L (ref 135–145)

## 2021-09-02 LAB — BRAIN NATRIURETIC PEPTIDE: B Natriuretic Peptide: 82 pg/mL (ref 0.0–100.0)

## 2021-09-02 MED ORDER — TORSEMIDE 100 MG PO TABS: ORAL_TABLET | ORAL | 3 refills | Status: DC

## 2021-09-02 MED ORDER — FLUTICASONE PROPIONATE 50 MCG/ACT NA SUSP: 1.0000 | Freq: Every day | NASAL | 6 refills | Status: DC

## 2021-09-02 MED ORDER — POTASSIUM CHLORIDE CRYS ER 20 MEQ PO TBCR: 20.0000 meq | EXTENDED_RELEASE_TABLET | Freq: Every day | ORAL | 3 refills | Status: DC

## 2021-09-02 NOTE — Patient Instructions (Signed)
Medication Changes: ? ?Increase Torsemide to 100 mg (1 tab) in AM and 50 mg (1/2 tab) in PM ? ?Increase Potassium to 20 meq Daily ? ?Lab Work: ? ?Labs done today, your results will be available in MyChart, we will contact you for abnormal readings. ? ?Your physician recommends that you return for lab work in: 1-2 weeks ? ?Testing/Procedures: ? ?None ? ?Referrals: ? ?You have been referred to Cardiac Rehab, they will call you for an appointment ? ?Special Instructions // Education: ? ?Do the following things EVERYDAY: ?Weigh yourself in the morning before breakfast. Write it down and keep it in a log. ?Take your medicines as prescribed ?Eat low salt foods--Limit salt (sodium) to 2000 mg per day.  ?Stay as active as you can everyday ?Limit all fluids for the day to less than 2 liters ? ? ?Follow-Up in: in 3-4 weeks ? ?At the [facility], you and your health needs are our priority. We have a designated team specialized in the treatment of Heart Failure. This Care Team includes your primary Heart Failure Specialized Cardiologist (physician), Advanced Practice Providers (APPs- Physician Assistants and Nurse Practitioners), and Pharmacist who all work together to provide you with the care you need, when you need it.  ? ?You may see any of the following providers on your designated Care Team at your next follow up: ? ?Dr Glori Bickers ?Dr Loralie Champagne ?Darrick Grinder, NP ?Lyda Jester, PA ?Jessica Milford,NP ?Marlyce Huge, PA ?Audry Riles, PharmD ? ? ?Please be sure to bring in all your medications bottles to every appointment.  ? ?Need to Contact us: ? ?If you have any questions or concerns before your next appointment please send [place] a message through [place] or call our office at [phone].   ? ?TO LEAVE A MESSAGE FOR THE NURSE SELECT OPTION 2, PLEASE LEAVE A MESSAGE INCLUDING: ?YOUR NAME ?DATE OF BIRTH ?CALL BACK NUMBER ?REASON FOR CALL**this is important as we prioritize the call backs ? ?YOU  WILL RECEIVE A CALL BACK THE SAME DAY AS LONG AS YOU CALL BEFORE 4:00 PM ? ? ?

## 2021-09-02 NOTE — Progress Notes (Signed)
? ?ID:  Tamara Silva, DOB January 31, 1961, MRN [id number]   ?Provider location: [place] [facility] Failure ?Type of Visit: Established patient  ? ?PCP:  de [place], Raymond J, MD ?Endocrinology: Dr. Dwyane Dee  ?HF Cardiology: Dr Aundra Dubin ?  ?History of Present Illness: ?Tamara Silva is a 61 y.o. 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  She has had frequent admits for anemia and CHF with transfusions and diuresis. Prasugrel was stopped. No source of bleeding found on EGD and colonoscopy.   She is on home 02 and nocturnal BiPAP for OHS/OSA and COPD.   She no longer smokes.  Hx of PE in 2014.    Hx of bradycardia with Coreg at higher dose.  Echo 05/31/18 with EF 40-45%, PA peak pressure 71 mmHg.   ? ?Admitted 10/25/18 with SOB and edema, + productive cough.  This started over a few weeks prior to admission. Hospital course complicated by PEA arrest on 10/28/18. CPR for 25 minutes. Extubated 10/30/18.  Developed AKI.  HF team followed closely to optimize HF.  She was massively volume overloaded and extensively diuresed, weight down 65 lbs total.  Echo showed EF down to 30-35%.  No coronary angiography due to AKI.  ? ?Readmitted  11/17/18 with lower extremity edema and volume overload. Diuresed with IV lasix and transitioned to torsemide 40 mg twice daily. Discharged on 11/21/18 to home on oxygen.  ? ?She had RHC/LHC in 9/20, most significant stenosis was 75% ostial D1.  There was mild to moderate nonobstructive disease in other vessels. There was not an interventional target.  Filling pressures were mildly elevated with preserved cardiac output.  Torsemide was increased.  ? ?Echo in 10/20 showed that EF remained 30-35% with wall motion abnormalities, RV appeared normal.   Medtronic ICD placed in 11/20.  ? ?She was lost to follow up since 7/21. Returned 1/23 and had been off metolazone for several  months. She was mildly SOB walking around her house. Repeat echo and paramedicine arranged. ? ?Echo in 2/23 showed EF 30-35%, dyskinetic apex, severe LV dilation, normal RV size and systolic function. ? ?Today she returns for HF follow up with paramedicine. She is using home oxygen and Bipap at night. She uses a wheelchair to go longer distances.  Usually able to walk around the house without dyspnea.  No lightheadedness.  No chest pain.  No orthopnea/PND.   ? ?ECG (personally reviewed): NSR, inferior and anterior Qs.  ? ?MDT device interrogation: Fluid index > threshold with decreased impedance, 2 short AF episodes (<1 hr).   ? ?Labs (7/20): K 3.7, creatinine 1.56 ?Labs (9/20): K 3.7, creatinine 1.46, LDL 52, HDL 56  ?Labs (12/20): K 3.8, creatinine 1.51 ?Labs (1/21): hgb 10.6, K 4.4, creatinine 1.91 ?Labs (5/21): LDL 68, HDL 56 ?Labs (7/21): K 4.4, creatinine 2.34, hgb 9.7 ?Labs (11/22): K 4.4., creatinine 2.30, hgb 9.3 ?Labs (1/23): K 4.0, creatinine 2.32 ?Labs (2/23): LDL 55, TGs 74 ?Labs (3/23): K 4.4, creatinine 2.33 ? ?PMH: ?1. CVA (1/20). ?2. Type 2 diabetes ?3. HTN ?4. H/o seizures ?5. PEA arrest (5/20) with CPR.  ?6. COPD: Home oxygen.  Stopped smoking in 1/15.  ?7. CAD: PCI [facility] 2006, [facility] 2009.  ?- Inferior STEMI 5/15, DES to RCA.  ?- Poor responder to Plavix.  ?- LHC (9/20): 75% ostial D1,  40-50% mLCx, 40% proximal PLV.  ?8. Hyperlipidemia ?9. PE: 2014, LLL.  ?10. OHS/OSA: Home oxygen and Bipap used.  ?11. Chronic systolic CHF: Suspect primarily ischemic cardiomyopathy. Medtronic ICD.  ?- Echo (5/15): EF 35-40% ?- Echo (2/16): EF 40-45% ?- Echo (1/20): EF 40-45% ?- Echo (5/20): EF 30-35%, mild RV dilation.  ?- RHC (5/20): mean RA 9, PA 62/22 mean 37, mean PCWP 15, CI 4.29, PVR 2.1 WU.  ?- RHC (9/20): mean RA 11, PA 66/18 mean 37, mean PCWP 19, CI 3.12, PVR 2.4 WU ?- Echo (10/20): EF 30-35% with wall motion abnormalities, normal RV.  ?- Echo (2/23): EF 30-35%, dyskinetic apex, severe LV  dilation, normal RV size and systolic function. ?12. CKD: Stage 3.  ?13. Fe deficiency anemia: EGD and colonoscopy without definite source of bleeding.  ? ?ROS: All systems negative except as listed in HPI, PMH and Problem List. ? ?SH:  ?Social History  ? ?Socioeconomic History  ? Marital status: Single  ?  Spouse name: Not on file  ? Number of children: 1  ? Years of education: Not on file  ? Highest education level: Not on file  ?Occupational History  ? Not on file  ?Tobacco Use  ? Smoking status: Former  ?  Packs/day: 0.50  ?  Years: 20.00  ?  Pack years: 10.00  ?  Types: Cigarettes  ?  Quit date: 05/31/2018  ?  Years since quitting: 3.2  ? Smokeless tobacco: Never  ? Tobacco comments:  ?  smoked off and on x 20 years  ?Vaping Use  ? Vaping Use: Never used  ?Substance and Sexual Activity  ? Alcohol use: No  ?  Alcohol/week: 0.0 standard drinks  ? Drug use: Not Currently  ?  Types: Cocaine  ? Sexual activity: Not Currently  ?Other Topics Concern  ? Not on file  ?Social History Narrative  ? Origibnally from [place]  ? Most recently from [place]  ? Daughter lives in town  ?   ? On Social security to CHF, COPD, CAD  ? ?Social Determinants of Health  ? ?Financial Resource Strain: Not on file  ?Food Insecurity: Not on file  ?Transportation Needs: Not on file  ?Physical Activity: Not on file  ?Stress: Not on file  ?Social Connections: Not on file  ?Intimate Partner Violence: Not on file  ? ?FH:  ?Family History  ?Problem Relation Age of Onset  ? Stroke Mother   ? Hypertension Mother   ? Colon cancer Father   ? Diabetes Father   ? Heart attack Father   ? Sarcoidosis Other   ? Breast cancer Paternal Aunt   ? Emphysema Brother   ? Lung disease Brother   ?     Unknown type, 3 brothers, one with liver and lung disease  ? Diabetes Brother   ? Asthma Paternal Aunt   ? Diabetes Sister   ? ? ?Current Outpatient Medications  ?Medication Sig Dispense Refill  ? ACCU-CHEK AVIVA PLUS test strip USE 1 STRIP TO CHECK BLOOD SUGAR 3  TIMES A DAY . 100 each 0  ? acetaminophen (TYLENOL) 500 MG tablet Take 500 mg by mouth every 6 (six) hours as needed for moderate pain or headache.    ? albuterol (PROVENTIL) (2.5 MG/3ML) 0.083% nebulizer solution Take 3 mLs (2.5 mg total) by nebulization every 6 (six) hours as needed for wheezing or shortness of breath. 360 mL 5  ? ANORO ELLIPTA 62.5-25 MCG/INH AEPB INHALE 1 PUFF INTO THE  LUNGS DAILY. 60 each 5  ? ascorbic acid (VITAMIN C) 500 MG tablet TAKE ONE TABLET BY MOUTH ONCE DAILY 90 tablet 3  ? ASPIRIN LOW DOSE 81 MG EC tablet CHEW 1 TABLET (81 MG TOTAL) BY MOUTH DAILY. 90 tablet 3  ? BIDIL 20-37.5 MG tablet TAKE 1 TABLET BY MOUTH 3 (THREE) TIMES DAILY. 270 tablet 3  ? bisoprolol (ZEBETA) 5 MG tablet TAKE 1 TABLET (5 MG TOTAL) BY MOUTH DAILY. 30 tablet 6  ? Blood Glucose Monitoring Suppl (ACCU-CHEK GUIDE) w/Device KIT Use accu chek guide to check blood sugar three times daily. 1 kit 0  ? Continuous Blood Gluc Sensor (FREESTYLE LIBRE 2 SENSOR) MISC USE EVERY 14 (FOURTEEN) DAYS. 2 each 3  ? D3-1000 25 MCG (1000 UT) tablet Take 1,000 Units by mouth 2 (two) times daily.    ? FEROSUL 325 (65 Fe) MG tablet TAKE 1 TABLET (325 MG TOTAL) BY MOUTH DAILY AFTER SUPPER. PLEASE TAKE WITH A SOURCE OF VITAMIN C 90 tablet 3  ? hydrocerin (EUCERIN) CREA Apply 1 application topically 2 (two) times daily.  0  ? Insulin Degludec (TRESIBA [place]) Inject 60 Units into the skin daily in the afternoon.    ? Insulin Pen Needle (EASY COMFORT PEN NEEDLES) 31G X 5 MM MISC USE 3 TIMES A DAY FOR INSULIN ADMINISTRATION 100 each 12  ? ipratropium-albuterol (DUONEB) 0.5-2.5 (3) MG/3ML SOLN INHALE CONTENTS OF ONE VIAL USING NEBULIZER EVERY 6 HOURS AS NEEDED FOR SHORTNESS OF BREATH, WHEEZING 360 mL 5  ? JARDIANCE 10 MG TABS tablet TAKE ONE TABLET BY MOUTH ONCE DAILY BEFORE BREAKFAST 30 tablet 1  ? NEEDLE, DISP, 26 G 26G X 1/2" MISC 1 application by Does not apply route daily. 100 each 0  ? omeprazole (PRILOSEC) 40 MG capsule Take 1 capsule (40  mg total) by mouth daily. 90 capsule 0  ? OZEMPIC, 2 MG/DOSE, 8 MG/3ML SOPN INJECT 2 MG AS DIRECTED ONCE A WEEK. 3 mL 2  ? potassium chloride SA (KLOR-CON M) 20 MEQ tablet Take 1 tablet (20 mEq total) by mouth dail

## 2021-09-08 ENCOUNTER — Ambulatory Visit (INDEPENDENT_AMBULATORY_CARE_PROVIDER_SITE_OTHER): Payer: Medicaid Other

## 2021-09-08 DIAGNOSIS — Z95810 Presence of automatic (implantable) cardiac defibrillator: Secondary | ICD-10-CM

## 2021-09-08 DIAGNOSIS — I5022 Chronic systolic (congestive) heart failure: Secondary | ICD-10-CM

## 2021-09-08 NOTE — Progress Notes (Signed)
EPIC Encounter for ICM Monitoring ? ?Patient Name: Tamara Silva is a 61 y.o. female ?Date: 09/08/2021 ?Primary Care Physican: de [place], Blondell Reveal, MD ?Primary Cardiologist: Aundra Dubin ?Electrophysiologist: [place] ?08/24/2021 Weight:  336 lbs ?                                                         ?  ?Spoke with patient and heart failure questions reviewed.  Pt she is feeling well at this time.   ?  ?Optivol thoracic impedance suggesting fluid levels returned to normal since 4/5 HF Clinic visit.  ?  ?Prescribed:  ?Torsemide 100 mg take 1 tablet (100 mg total) by mouth every morning and 0.5 tablet (50 mg total) every evening  ?Potassium 20 mEq take 1 tablet (20 mEq total) by mouth daily. ?Spironolactone 25 mg take 1 tablet daily ?Jardiance 10 mg take 1 tablet daily before breakfast ?  ?Labs: ?09/14/2021 BMET Scheduled ?09/02/2021 Creatinine 2.19, BUN 38, Potassium 3.8, Sodium 141, GFR 25 ?07/29/2021 Creatinine 2.33, BUN 58, Potassium 4.4, Sodium 137  ?07/28/2021 Creatinine 2.20, BUN 50, Potassium 4.1, Sodium 139, GFR 25  ?06/04/2021 Creatinine 2.32, BUN 33, Potassium 4.0, Sodium 138, GFR 23  ?A complete set of results can be found in Results Review. ? ?Recommendations:  Pt confirms compliance with Torsemide. No changes and encouraged to call if experiencing any fluid symptoms.  ?  ?Follow-up plan: ICM clinic phone appointment on 10/05/2021.   91 day device clinic remote transmission 10/20/2021.   ?  ?EP/Cardiology Office Visits: 10/01/2021 with HF clinic. ?  ?Copy of ICM check sent to Dr. Curt Bears.   ? ?3 month ICM trend: 09/08/2021. ? ? ? ?12-14 Month ICM trend:  ? ? ? ?[name], RN ?09/08/2021 ?2:11 PM ? ?

## 2021-09-14 ENCOUNTER — Ambulatory Visit (HOSPITAL_COMMUNITY)
Admission: RE | Admit: 2021-09-14 | Discharge: 2021-09-14 | Disposition: A | Discharge status: Home / Self Care | Payer: Medicaid Other | Source: Ambulatory Visit | Attending: Cardiology | Admitting: Cardiology

## 2021-09-14 DIAGNOSIS — I5022 Chronic systolic (congestive) heart failure: Secondary | ICD-10-CM | POA: Diagnosis present

## 2021-09-14 LAB — BASIC METABOLIC PANEL
Anion gap: 7 (ref 5–15)
BUN: 34 mg/dL — ABNORMAL HIGH (ref 8–23)
CO2: 29 mmol/L (ref 22–32)
Calcium: 9.4 mg/dL (ref 8.9–10.3)
Chloride: 106 mmol/L (ref 98–111)
Creatinine, Ser: 1.88 mg/dL — ABNORMAL HIGH (ref 0.44–1.00)
GFR, Estimated: 30 mL/min — ABNORMAL LOW (ref >60)
Glucose, Bld: 134 mg/dL — ABNORMAL HIGH (ref 70–99)
Potassium: 4.1 mmol/L (ref 3.5–5.1)
Sodium: 142 mmol/L (ref 135–145)

## 2021-09-15 ENCOUNTER — Other Ambulatory Visit: Payer: Self-pay | Admitting: Endocrinology

## 2021-09-18 ENCOUNTER — Telehealth: Payer: Self-pay

## 2021-09-18 ENCOUNTER — Other Ambulatory Visit (HOSPITAL_COMMUNITY): Payer: Self-pay

## 2021-09-18 NOTE — Telephone Encounter (Signed)
Patient Advocate Encounter ? ?Prior Authorization for [employer] 2 sensors has been approved.   ? ?PA# [id number] ? ?Effective dates: 09/18/21 through 09/19/22 ? ?Spoke with Pharmacy to Process. ? ?Patient Advocate ?Fax: [phone]  ?

## 2021-09-18 NOTE — Telephone Encounter (Signed)
Patient Advocate Encounter ?  ?Received notification from [facility] that prior authorization for [facility] 2 sensors is required by his/her insurance [employer]. ?  ?PA submitted on 09/18/21 ? ?Key#: [id number] ? ?Status is pending ?   ?[facility] will continue to follow: ? ?Patient Advocate ?Fax: [phone]  ?

## 2021-09-21 ENCOUNTER — Encounter (HOSPITAL_COMMUNITY): Payer: Self-pay

## 2021-09-21 ENCOUNTER — Telehealth (HOSPITAL_COMMUNITY): Payer: Self-pay

## 2021-09-21 ENCOUNTER — Other Ambulatory Visit: Payer: Self-pay | Admitting: Endocrinology

## 2021-09-21 DIAGNOSIS — E1165 Type 2 diabetes mellitus with hyperglycemia: Secondary | ICD-10-CM

## 2021-09-21 NOTE — Patient Instructions (Addendum)
How to adjust Tresiba dose:  Look at last 3-4 days of morning blood sugars, if over 140, increase dose of Tresiba by 2 units, and wait another 3-4 days before making another dosage change, ?2.  Scan Libre sensor first thing in AM, and once during the day, and again before bed ?3.  Add one ounce of low fat protein to lunch when having fruit. ?

## 2021-09-21 NOTE — Telephone Encounter (Signed)
Attempted to call patient in regards to Cardiac Rehab - unable to leave VM.   Mailed letter 

## 2021-09-21 NOTE — Telephone Encounter (Signed)
Pt insurance is active and benefits verified through Medicaid. Co-pay $0.00, DED $0.00/$0.00 met, out of pocket $0.00/$0.00 met, co-insurance 0%. No pre-authorization required. Passport, 09/21/21 @ 1:51PM, ITG#[id number] ?  ?Will contact patient to see if she is interested in the Cardiac Rehab Program. ?

## 2021-09-21 NOTE — Progress Notes (Signed)
Patient is here today to review her diet.  Says she has been trying very hard to do "everything right".   ?Insulin:  Tresiba: 60-65u HS.  Does not forget this does.  Dose is based on HS reading   Novolog: has stopped this X2 weeks. ?Exercise:  none ?SBGM:  Libre:  last 7 days:  average: 123, last 14 days: ave: 125.  98% time in range:  ?Diet:  Typical day: ?4:30-5AM: water ?7:30:  Bfast:  1 eg, 1 link sausage 1 bread dry, with water to drink, or small grits , 1 egg, 1 link sausage or unsweet lemonade ?1PM: snack:  PB sandwich or apple ?6PM: chichken 2 wings,or low sodium ham, , corn, green beans,  1 piece of bread, and fruit-strawberries, or kiwi, or grapes.  Other veg, on other nights:  carrots, broccoli, or cauliflower.  Water to drink ?Usually nothing after supper. ?Discussion.   ? How to adjust Tresiba dose.  Not based on HS reading but rather FBS after 3 day average.  She reported good understanding of this.  Written instructions given for this. ?Diet:  reviewed need for balanced snack with small amount of protein at 1PM: like 1 ounce of [place] with fruit, or low fat cheese ?SBGM:  need to scan q 8 hours to see full 24 hour picture.   ?Gave much praise for her dietary changes and motivation. ?

## 2021-09-25 ENCOUNTER — Other Ambulatory Visit (HOSPITAL_COMMUNITY): Payer: Self-pay | Admitting: Cardiology

## 2021-09-25 DIAGNOSIS — K219 Gastro-esophageal reflux disease without esophagitis: Secondary | ICD-10-CM

## 2021-09-26 ENCOUNTER — Other Ambulatory Visit (HOSPITAL_COMMUNITY): Payer: Self-pay | Admitting: Cardiology

## 2021-09-30 ENCOUNTER — Telehealth (HOSPITAL_COMMUNITY): Payer: Self-pay

## 2021-09-30 NOTE — Progress Notes (Signed)
? ? ?ID:  Tamara Silva, DOB 12-21-1960, MRN [id number]   ?Provider location: [facility] ?Type of Visit: Established patient  ? ?PCP:  de [place], Raymond J, MD ?Endocrinology: Dr. Dwyane Dee  ?HF Cardiology: Dr Aundra Dubin ?  ?History of Present Illness: ?Tamara Silva is a 61 y.o. 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  She has had frequent admits for anemia and CHF with transfusions and diuresis. Prasugrel was stopped. No source of bleeding found on EGD and colonoscopy.   She is on home 02 and nocturnal BiPAP for OHS/OSA and COPD.   She no longer smokes.  Hx of PE in 2014.    Hx of bradycardia with Coreg at higher dose.  Echo 05/31/18 with EF 40-45%, PA peak pressure 71 mmHg.   ? ?Admitted 10/25/18 with SOB and edema, + productive cough.  This started over a few weeks prior to admission. Hospital course complicated by PEA arrest on 10/28/18. CPR for 25 minutes. Extubated 10/30/18.  Developed AKI.  HF team followed closely to optimize HF.  She was massively volume overloaded and extensively diuresed, weight down 65 lbs total.  Echo showed EF down to 30-35%.  No coronary angiography due to AKI.  ? ?Readmitted  11/17/18 with lower extremity edema and volume overload. Diuresed with IV lasix and transitioned to torsemide 40 mg twice daily. Discharged on 11/21/18 to home on oxygen.  ? ?She had RHC/LHC in 9/20, most significant stenosis was 75% ostial D1.  There was mild to moderate nonobstructive disease in other vessels. There was not an interventional target.  Filling pressures were mildly elevated with preserved cardiac output.  Torsemide was increased.  ? ?Echo in 10/20 showed that EF remained 30-35% with wall motion abnormalities, RV appeared normal.   Medtronic ICD placed in 11/20.  ? ?She was lost to follow up since 7/21. Returned 1/23 and had been off metolazone for several  months. She was mildly SOB walking around her house. Repeat echo and paramedicine arranged. ? ?Echo in 2/23 showed EF 30-35%, dyskinetic apex, severe LV dilation, normal RV size and systolic function. ? ?Today she returns for HF follow up.Overall feeling fine. Uses a wheel chair when out of the house. SOB with exertion. Denies PND/Orthopnea. Wears 2 liters [place].Appetite ok. No fever or chills. Weight at home  has been stable. Occasionally misses torsemide if she has doctors appointments. Taking all medications. Lives with her boyfriend.  ? ?MDT device interrogation: Impedance well below threshold. NO VT . Activity < 1 hour per day.  ? ?Labs (7/20): K 3.7, creatinine 1.56 ?Labs (9/20): K 3.7, creatinine 1.46, LDL 52, HDL 56  ?Labs (12/20): K 3.8, creatinine 1.51 ?Labs (1/21): hgb 10.6, K 4.4, creatinine 1.91 ?Labs (5/21): LDL 68, HDL 56 ?Labs (7/21): K 4.4, creatinine 2.34, hgb 9.7 ?Labs (11/22): K 4.4., creatinine 2.30, hgb 9.3 ?Labs (1/23): K 4.0, creatinine 2.32 ?Labs (2/23): LDL 55, TGs 74 ?Labs (3/23): K 4.4, creatinine 2.33 ?Labs (09/14/21): Creatinine 1.88 ? ?PMH: ?1. CVA (1/20). ?2. Type 2 diabetes ?3. HTN ?4. H/o seizures ?5. PEA arrest (5/20) with CPR.  ?6. COPD: Home oxygen.  Stopped smoking in 1/15.  ?7. CAD: PCI [facility] 2006, [facility] 2009.  ?- Inferior STEMI 5/15, DES to RCA.  ?- Poor responder to Plavix.  ?- LHC (9/20): 75% ostial  D1, 40-50% mLCx, 40% proximal PLV.  ?8. Hyperlipidemia ?9. PE: 2014, LLL.  ?10. OHS/OSA: Home oxygen and Bipap used.  ?11. Chronic systolic CHF: Suspect primarily ischemic cardiomyopathy. Medtronic ICD.  ?- Echo (5/15): EF 35-40% ?- Echo (2/16): EF 40-45% ?- Echo (1/20): EF 40-45% ?- Echo (5/20): EF 30-35%, mild RV dilation.  ?- RHC (5/20): mean RA 9, PA 62/22 mean 37, mean PCWP 15, CI 4.29, PVR 2.1 WU.  ?- RHC (9/20): mean RA 11, PA 66/18 mean 37, mean PCWP 19, CI 3.12, PVR 2.4 WU ?- Echo (10/20): EF 30-35% with wall motion abnormalities, normal RV.  ?- Echo (2/23): EF  30-35%, dyskinetic apex, severe LV dilation, normal RV size and systolic function. ?12. CKD: Stage 3.  ?13. Fe deficiency anemia: EGD and colonoscopy without definite source of bleeding.  ? ?ROS: All systems negative except as listed in HPI, PMH and Problem List. ? ?SH:  ?Social History  ? ?Socioeconomic History  ? Marital status: Single  ?  Spouse name: Not on file  ? Number of children: 1  ? Years of education: Not on file  ? Highest education level: Not on file  ?Occupational History  ? Not on file  ?Tobacco Use  ? Smoking status: Former  ?  Packs/day: 0.50  ?  Years: 20.00  ?  Pack years: 10.00  ?  Types: Cigarettes  ?  Quit date: 05/31/2018  ?  Years since quitting: 3.3  ? Smokeless tobacco: Never  ? Tobacco comments:  ?  smoked off and on x 20 years  ?Vaping Use  ? Vaping Use: Never used  ?Substance and Sexual Activity  ? Alcohol use: No  ?  Alcohol/week: 0.0 standard drinks  ? Drug use: Not Currently  ?  Types: Cocaine  ? Sexual activity: Not Currently  ?Other Topics Concern  ? Not on file  ?Social History Narrative  ? Origibnally from [place]  ? Most recently from [place]  ? Daughter lives in town  ?   ? On Social security to CHF, COPD, CAD  ? ?Social Determinants of Health  ? ?Financial Resource Strain: Not on file  ?Food Insecurity: Not on file  ?Transportation Needs: Not on file  ?Physical Activity: Not on file  ?Stress: Not on file  ?Social Connections: Not on file  ?Intimate Partner Violence: Not on file  ? ?FH:  ?Family History  ?Problem Relation Age of Onset  ? Stroke Mother   ? Hypertension Mother   ? Colon cancer Father   ? Diabetes Father   ? Heart attack Father   ? Sarcoidosis Other   ? Breast cancer Paternal Aunt   ? Emphysema Brother   ? Lung disease Brother   ?     Unknown type, 3 brothers, one with liver and lung disease  ? Diabetes Brother   ? Asthma Paternal Aunt   ? Diabetes Sister   ? ? ?Current Outpatient Medications  ?Medication Sig Dispense Refill  ? ACCU-CHEK AVIVA PLUS test strip USE  1 STRIP TO CHECK BLOOD SUGAR 3 TIMES A DAY . 100 each 0  ? acetaminophen (TYLENOL) 500 MG tablet Take 500 mg by mouth every 6 (six) hours as needed for moderate pain or headache.    ? albuterol (PROVENTIL) (2.5 MG/3ML) 0.083% nebulizer solution Take 3 mLs (2.5 mg total) by nebulization every 6 (six) hours as needed for wheezing or shortness of breath. 360 mL 5  ? ANORO ELLIPTA 62.5-25 MCG/INH AEPB INHALE 1 PUFF INTO  THE LUNGS DAILY. 60 each 5  ? ascorbic acid (VITAMIN C) 500 MG tablet TAKE ONE TABLET BY MOUTH ONCE DAILY 90 tablet 3  ? ASPIRIN LOW DOSE 81 MG EC tablet CHEW 1 TABLET (81 MG TOTAL) BY MOUTH DAILY. 90 tablet 3  ? BIDIL 20-37.5 MG tablet TAKE 1 TABLET BY MOUTH 3 (THREE) TIMES DAILY. 270 tablet 3  ? bisoprolol (ZEBETA) 5 MG tablet TAKE 1 TABLET (5 MG TOTAL) BY MOUTH DAILY. 30 tablet 6  ? Blood Glucose Monitoring Suppl (ACCU-CHEK GUIDE) w/Device KIT Use accu chek guide to check blood sugar three times daily. 1 kit 0  ? Continuous Blood Gluc Sensor (FREESTYLE LIBRE 2 SENSOR) MISC USE EVERY 14 (FOURTEEN) DAYS. 2 each 3  ? D3-1000 25 MCG (1000 UT) tablet Take 1,000 Units by mouth 2 (two) times daily.    ? FEROSUL 325 (65 Fe) MG tablet TAKE 1 TABLET (325 MG TOTAL) BY MOUTH DAILY AFTER SUPPER. PLEASE TAKE WITH A SOURCE OF VITAMIN C 90 tablet 3  ? fluticasone (FLONASE) 50 MCG/ACT nasal spray Place 1 spray into both nostrils daily. 16 g 6  ? hydrocerin (EUCERIN) CREA Apply 1 application topically 2 (two) times daily.  0  ? insulin degludec (TRESIBA FLEXTOUCH) 200 UNIT/ML FlexTouch Pen INJECT 60 UNITS UNDER THE SKIN ONCE DAILY 18 mL 3  ? Insulin Degludec (TRESIBA [place]) Inject 60 Units into the skin daily in the afternoon.    ? Insulin Pen Needle (EASY COMFORT PEN NEEDLES) 31G X 5 MM MISC USE 3 TIMES A DAY FOR INSULIN ADMINISTRATION 100 each 12  ? ipratropium-albuterol (DUONEB) 0.5-2.5 (3) MG/3ML SOLN INHALE CONTENTS OF ONE VIAL USING NEBULIZER EVERY 6 HOURS AS NEEDED FOR SHORTNESS OF BREATH, WHEEZING 360 mL 5  ?  JARDIANCE 10 MG TABS tablet TAKE ONE TABLET BY MOUTH ONCE DAILY BEFORE BREAKFAST 30 tablet 1  ? NEEDLE, DISP, 26 G 26G X 1/2" MISC 1 application by Does not apply route daily. 100 each 0  ? omeprazole (PRILOSE

## 2021-09-30 NOTE — Telephone Encounter (Signed)
Called to confirm/remind patient of their appointment at the [facility] on 10/01/21.  ? ?Patient reminded to bring all medications and/or complete list. ? ?Confirmed patient has transportation. Gave directions, instructed to utilize [place] parking. ? ?Confirmed appointment prior to ending call.  ? ?

## 2021-10-01 ENCOUNTER — Ambulatory Visit (HOSPITAL_COMMUNITY)
Admission: RE | Admit: 2021-10-01 | Discharge: 2021-10-01 | Disposition: A | Discharge status: Home / Self Care | Payer: Medicaid Other | Source: Ambulatory Visit | Attending: Family Medicine | Admitting: Family Medicine

## 2021-10-01 ENCOUNTER — Encounter (HOSPITAL_COMMUNITY): Payer: Self-pay

## 2021-10-01 VITALS — BP 108/60 | HR 58 | Wt 333.6 lb

## 2021-10-01 DIAGNOSIS — R0902 Hypoxemia: Secondary | ICD-10-CM | POA: Diagnosis not present

## 2021-10-01 DIAGNOSIS — Z79899 Other long term (current) drug therapy: Secondary | ICD-10-CM | POA: Diagnosis not present

## 2021-10-01 DIAGNOSIS — K219 Gastro-esophageal reflux disease without esophagitis: Secondary | ICD-10-CM

## 2021-10-01 DIAGNOSIS — I272 Pulmonary hypertension, unspecified: Secondary | ICD-10-CM | POA: Insufficient documentation

## 2021-10-01 DIAGNOSIS — Z7984 Long term (current) use of oral hypoglycemic drugs: Secondary | ICD-10-CM | POA: Diagnosis not present

## 2021-10-01 DIAGNOSIS — G4733 Obstructive sleep apnea (adult) (pediatric): Secondary | ICD-10-CM | POA: Insufficient documentation

## 2021-10-01 DIAGNOSIS — I4891 Unspecified atrial fibrillation: Secondary | ICD-10-CM | POA: Diagnosis not present

## 2021-10-01 DIAGNOSIS — J449 Chronic obstructive pulmonary disease, unspecified: Secondary | ICD-10-CM | POA: Diagnosis not present

## 2021-10-01 DIAGNOSIS — E669 Obesity, unspecified: Secondary | ICD-10-CM | POA: Diagnosis not present

## 2021-10-01 DIAGNOSIS — Z87891 Personal history of nicotine dependence: Secondary | ICD-10-CM | POA: Diagnosis not present

## 2021-10-01 DIAGNOSIS — E785 Hyperlipidemia, unspecified: Secondary | ICD-10-CM | POA: Diagnosis not present

## 2021-10-01 DIAGNOSIS — I2510 Atherosclerotic heart disease of native coronary artery without angina pectoris: Secondary | ICD-10-CM | POA: Insufficient documentation

## 2021-10-01 DIAGNOSIS — Z6841 Body Mass Index (BMI) 40.0 and over, adult: Secondary | ICD-10-CM | POA: Diagnosis not present

## 2021-10-01 DIAGNOSIS — D649 Anemia, unspecified: Secondary | ICD-10-CM | POA: Diagnosis not present

## 2021-10-01 DIAGNOSIS — Z86711 Personal history of pulmonary embolism: Secondary | ICD-10-CM | POA: Insufficient documentation

## 2021-10-01 DIAGNOSIS — I252 Old myocardial infarction: Secondary | ICD-10-CM | POA: Diagnosis not present

## 2021-10-01 DIAGNOSIS — E1122 Type 2 diabetes mellitus with diabetic chronic kidney disease: Secondary | ICD-10-CM | POA: Insufficient documentation

## 2021-10-01 DIAGNOSIS — Z8674 Personal history of sudden cardiac arrest: Secondary | ICD-10-CM | POA: Insufficient documentation

## 2021-10-01 DIAGNOSIS — Z95810 Presence of automatic (implantable) cardiac defibrillator: Secondary | ICD-10-CM | POA: Diagnosis not present

## 2021-10-01 DIAGNOSIS — Z955 Presence of coronary angioplasty implant and graft: Secondary | ICD-10-CM | POA: Diagnosis not present

## 2021-10-01 DIAGNOSIS — I5022 Chronic systolic (congestive) heart failure: Secondary | ICD-10-CM | POA: Diagnosis not present

## 2021-10-01 DIAGNOSIS — Z9981 Dependence on supplemental oxygen: Secondary | ICD-10-CM | POA: Diagnosis not present

## 2021-10-01 DIAGNOSIS — N183 Chronic kidney disease, stage 3 unspecified: Secondary | ICD-10-CM

## 2021-10-01 DIAGNOSIS — I130 Hypertensive heart and chronic kidney disease with heart failure and stage 1 through stage 4 chronic kidney disease, or unspecified chronic kidney disease: Secondary | ICD-10-CM | POA: Insufficient documentation

## 2021-10-01 DIAGNOSIS — Z8673 Personal history of transient ischemic attack (TIA), and cerebral infarction without residual deficits: Secondary | ICD-10-CM | POA: Diagnosis not present

## 2021-10-01 LAB — BASIC METABOLIC PANEL
Anion gap: 9 (ref 5–15)
BUN: 32 mg/dL — ABNORMAL HIGH (ref 8–23)
CO2: 28 mmol/L (ref 22–32)
Calcium: 9.3 mg/dL (ref 8.9–10.3)
Chloride: 105 mmol/L (ref 98–111)
Creatinine, Ser: 1.96 mg/dL — ABNORMAL HIGH (ref 0.44–1.00)
GFR, Estimated: 29 mL/min — ABNORMAL LOW (ref >60)
Glucose, Bld: 113 mg/dL — ABNORMAL HIGH (ref 70–99)
Potassium: 4.6 mmol/L (ref 3.5–5.1)
Sodium: 142 mmol/L (ref 135–145)

## 2021-10-01 MED ORDER — OMEPRAZOLE 40 MG PO CPDR: 40.0000 mg | DELAYED_RELEASE_CAPSULE | Freq: Every day | ORAL | 0 refills | Status: DC

## 2021-10-01 NOTE — Patient Instructions (Signed)
It was great to see you today! ?No medication changes are needed at this time. ? ?Labs today ?We will only contact you if something comes back abnormal or we need to make some changes. ?Otherwise no news is good news! ? ?Your physician recommends that you schedule a follow-up appointment in: 3-4 months with Dr McLean ? ? ?Do the following things EVERYDAY: ?Weigh yourself in the morning before breakfast. Write it down and keep it in a log. ?Take your medicines as prescribed ?Eat low salt foods--Limit salt (sodium) to 2000 mg per day.  ?Stay as active as you can everyday ?Limit all fluids for the day to less than 2 liters ? ?At the [facility], you and your health needs are our priority. As part of our continuing mission to provide you with exceptional heart care, we have created designated Provider Care Teams. These Care Teams include your primary Cardiologist (physician) and Advanced Practice Providers (APPs- Physician Assistants and Nurse Practitioners) who all work together to provide you with the care you need, when you need it.  ? ?You may see any of the following providers on your designated Care Team at your next follow up: ?Dr Daniel Bensimhon ?Dr [name] McLean ?Amy Clegg, NP ?Brittainy Simmons, PA ?Jessica Milford,NP ?Lindsay Finch, PA ?Lauren Kemp, PharmD ? ? ?Please be sure to bring in all your medications bottles to every appointment.  ? ?If you have any questions or concerns before your next appointment please send us a message through mychart or call our office at [phone].   ? ?TO LEAVE A MESSAGE FOR THE NURSE SELECT OPTION 2, PLEASE LEAVE A MESSAGE INCLUDING: ?YOUR NAME ?DATE OF BIRTH ?CALL BACK NUMBER ?REASON FOR CALL**this is important as we prioritize the call backs ? ?YOU WILL RECEIVE A CALL BACK THE SAME DAY AS LONG AS YOU CALL BEFORE 4:00 PM ? ? ?

## 2021-10-02 ENCOUNTER — Other Ambulatory Visit: Payer: Medicaid Other

## 2021-10-05 ENCOUNTER — Ambulatory Visit (INDEPENDENT_AMBULATORY_CARE_PROVIDER_SITE_OTHER): Payer: Medicaid Other

## 2021-10-05 DIAGNOSIS — I5022 Chronic systolic (congestive) heart failure: Secondary | ICD-10-CM | POA: Diagnosis not present

## 2021-10-05 DIAGNOSIS — Z95810 Presence of automatic (implantable) cardiac defibrillator: Secondary | ICD-10-CM | POA: Diagnosis not present

## 2021-10-06 ENCOUNTER — Telehealth (HOSPITAL_BASED_OUTPATIENT_CLINIC_OR_DEPARTMENT_OTHER): Payer: Self-pay | Admitting: Family Medicine

## 2021-10-06 NOTE — Telephone Encounter (Signed)
Pt called and is needing an order put in for a new manual wheelchair due to the pts while chair breaking. Pt stated that she is out of town and is needing it sent to her at [place], [place] [address] ? ?Pt stated she would like provider CMA to call her. ? ?Please advise. ? ?

## 2021-10-07 ENCOUNTER — Other Ambulatory Visit (HOSPITAL_BASED_OUTPATIENT_CLINIC_OR_DEPARTMENT_OTHER): Payer: Self-pay

## 2021-10-07 DIAGNOSIS — J449 Chronic obstructive pulmonary disease, unspecified: Secondary | ICD-10-CM

## 2021-10-08 ENCOUNTER — Telehealth: Payer: Self-pay

## 2021-10-08 NOTE — Progress Notes (Signed)
EPIC Encounter for ICM Monitoring ? ?Patient Name: Tamara Silva is a 61 y.o. female ?Date: 10/08/2021 ?Primary Care Physican: de [place], Blondell Reveal, MD ?Primary Cardiologist: Aundra Dubin ?Electrophysiologist: Camn[place] ?08/24/2021 Weight:  336 lbs ?                                                         ?  ?Attempted call to patient and unable to reach.  Left detailed message per DPR regarding transmission. Transmission reviewed.  ?  ?Optivol thoracic impedance suggesting fluid level trending close to baseline.  ?  ?Prescribed:  ?Torsemide 100 mg take 1 tablet (100 mg total) by mouth every morning and 0.5 tablet (50 mg total) every evening  ?Potassium 20 mEq take 1 tablet (20 mEq total) by mouth daily. ?Spironolactone 25 mg take 1 tablet daily ?Jardiance 10 mg take 1 tablet daily before breakfast ?  ?Labs: ?10/01/2021 Creatinine 1.96, BUN 32, Potassium 4.6, Sodium 142, GFR 29 ?09/14/2021 Creatinine 1.88, BUN 34, Potassium 4.1, Sodium 142, GFR 30 ?09/02/2021 Creatinine 2.19, BUN 38, Potassium 3.8, Sodium 141, GFR 25 ?07/29/2021 Creatinine 2.33, BUN 58, Potassium 4.4, Sodium 137  ?07/28/2021 Creatinine 2.20, BUN 50, Potassium 4.1, Sodium 139, GFR 25  ?06/04/2021 Creatinine 2.32, BUN 33, Potassium 4.0, Sodium 138, GFR 23  ?A complete set of results can be found in Results Review. ?  ?Recommendations:  Left voice mail with ICM number and encouraged to call if experiencing any fluid symptoms. ?  ?Follow-up plan: ICM clinic phone appointment on 11/09/2021.   91 day device clinic remote transmission 10/20/2021.   ?  ?EP/Cardiology Office Visits: 01/05/2022 with Dr Aundra Dubin. ?  ?Copy of ICM check sent to Dr. Curt Bears.    ? ?3 month ICM trend: 10/05/2021. ? ? ? ?12-14 Month ICM trend:  ? ? ? ?[name], RN ?10/08/2021 ?10:46 AM ? ?

## 2021-10-08 NOTE — Telephone Encounter (Signed)
Remote ICM transmission received.  Attempted call to patient regarding ICM remote transmission and left detailed message per DPR.  Advised to return call for any fluid symptoms or questions. Next ICM remote transmission scheduled 11/09/2021.   ? ?

## 2021-10-12 ENCOUNTER — Ambulatory Visit: Payer: Medicaid Other | Admitting: Endocrinology

## 2021-10-13 ENCOUNTER — Other Ambulatory Visit: Payer: Self-pay

## 2021-10-13 ENCOUNTER — Emergency Department (HOSPITAL_BASED_OUTPATIENT_CLINIC_OR_DEPARTMENT_OTHER)
Admission: EM | Admit: 2021-10-13 | Discharge: 2021-10-13 | Disposition: A | Discharge status: Home / Self Care | Payer: Medicaid Other | Attending: Emergency Medicine | Admitting: Emergency Medicine

## 2021-10-13 ENCOUNTER — Encounter (HOSPITAL_BASED_OUTPATIENT_CLINIC_OR_DEPARTMENT_OTHER): Payer: Self-pay

## 2021-10-13 DIAGNOSIS — Z5321 Procedure and treatment not carried out due to patient leaving prior to being seen by health care provider: Secondary | ICD-10-CM | POA: Diagnosis not present

## 2021-10-13 DIAGNOSIS — R1111 Vomiting without nausea: Secondary | ICD-10-CM | POA: Insufficient documentation

## 2021-10-13 LAB — CBC
HCT: 39 % (ref 36.0–46.0)
Hemoglobin: 11.6 g/dL — ABNORMAL LOW (ref 12.0–15.0)
MCH: 24.1 pg — ABNORMAL LOW (ref 26.0–34.0)
MCHC: 29.7 g/dL — ABNORMAL LOW (ref 30.0–36.0)
MCV: 81.1 fL (ref 80.0–100.0)
Platelets: 298 10*3/uL (ref 150–400)
RBC: 4.81 MIL/uL (ref 3.87–5.11)
RDW: 18.5 % — ABNORMAL HIGH (ref 11.5–15.5)
WBC: 7 10*3/uL (ref 4.0–10.5)
nRBC: 0 % (ref 0.0–0.2)

## 2021-10-13 LAB — COMPREHENSIVE METABOLIC PANEL
ALT: 12 U/L (ref 0–44)
AST: 16 U/L (ref 15–41)
Albumin: 3.9 g/dL (ref 3.5–5.0)
Alkaline Phosphatase: 59 U/L (ref 38–126)
Anion gap: 10 (ref 5–15)
BUN: 39 mg/dL — ABNORMAL HIGH (ref 8–23)
CO2: 27 mmol/L (ref 22–32)
Calcium: 9.5 mg/dL (ref 8.9–10.3)
Chloride: 101 mmol/L (ref 98–111)
Creatinine, Ser: 1.73 mg/dL — ABNORMAL HIGH (ref 0.44–1.00)
GFR, Estimated: 33 mL/min — ABNORMAL LOW (ref >60)
Glucose, Bld: 106 mg/dL — ABNORMAL HIGH (ref 70–99)
Potassium: 5.2 mmol/L — ABNORMAL HIGH (ref 3.5–5.1)
Sodium: 138 mmol/L (ref 135–145)
Total Bilirubin: 0.4 mg/dL (ref 0.3–1.2)
Total Protein: 8.7 g/dL — ABNORMAL HIGH (ref 6.5–8.1)

## 2021-10-13 LAB — LIPASE, BLOOD: Lipase: 43 U/L (ref 11–51)

## 2021-10-13 MED ORDER — ONDANSETRON 4 MG PO TBDP: 4.0000 mg | ORAL_TABLET | Freq: Once | ORAL | Status: DC | PRN

## 2021-10-13 MED ORDER — ONDANSETRON HCL 4 MG/2ML IJ SOLN
4.0000 mg | Freq: Once | INTRAMUSCULAR | Status: AC
Start: 2021-10-13 — End: 2021-10-13
  Administered 2021-10-13: 4 mg via INTRAVENOUS

## 2021-10-13 NOTE — ED Notes (Signed)
Patient refsued to have Respiratory Panel Completed in Triage. ?

## 2021-10-13 NOTE — ED Triage Notes (Signed)
Patient BIB [facility] from Home. ? ?Endorses N/V that began approximately 4 hours PTA. Endorses feeling ill approximately a few days PTA and being in contact with a Flu-Positive Individual.  ? ?VSS with [facility]. 650 mg of Tylenol given on Scene. Wears 3L [place] at Home at [place]. ? ?Anxious in Triage. A&Ox4. GCS 15. BIB Stretcher/Wheelchair.  ?

## 2021-10-13 NOTE — ED Notes (Signed)
Patient informed Staff, she would like to no longer wait for Examination Room/EDP Assessment. PIV removed and Patient taken via Wheelchair to [place] with [employer]. ?

## 2021-10-14 ENCOUNTER — Telehealth (HOSPITAL_COMMUNITY): Payer: Self-pay

## 2021-10-14 ENCOUNTER — Emergency Department (HOSPITAL_COMMUNITY): Payer: Medicaid Other

## 2021-10-14 ENCOUNTER — Observation Stay (HOSPITAL_COMMUNITY)
Admission: EM | Admit: 2021-10-14 | Discharge: 2021-10-17 | Disposition: A | Discharge status: Home / Self Care | Payer: Medicaid Other | Attending: Internal Medicine | Admitting: Internal Medicine

## 2021-10-14 ENCOUNTER — Encounter (HOSPITAL_COMMUNITY): Payer: Self-pay

## 2021-10-14 DIAGNOSIS — R112 Nausea with vomiting, unspecified: Secondary | ICD-10-CM | POA: Diagnosis present

## 2021-10-14 DIAGNOSIS — Z7984 Long term (current) use of oral hypoglycemic drugs: Secondary | ICD-10-CM | POA: Diagnosis not present

## 2021-10-14 DIAGNOSIS — Z95810 Presence of automatic (implantable) cardiac defibrillator: Secondary | ICD-10-CM | POA: Insufficient documentation

## 2021-10-14 DIAGNOSIS — E1169 Type 2 diabetes mellitus with other specified complication: Secondary | ICD-10-CM | POA: Insufficient documentation

## 2021-10-14 DIAGNOSIS — M25559 Pain in unspecified hip: Secondary | ICD-10-CM

## 2021-10-14 DIAGNOSIS — Z87891 Personal history of nicotine dependence: Secondary | ICD-10-CM | POA: Diagnosis not present

## 2021-10-14 DIAGNOSIS — Z20822 Contact with and (suspected) exposure to covid-19: Secondary | ICD-10-CM | POA: Insufficient documentation

## 2021-10-14 DIAGNOSIS — Z7982 Long term (current) use of aspirin: Secondary | ICD-10-CM | POA: Insufficient documentation

## 2021-10-14 DIAGNOSIS — E785 Hyperlipidemia, unspecified: Secondary | ICD-10-CM | POA: Diagnosis present

## 2021-10-14 DIAGNOSIS — I5042 Chronic combined systolic (congestive) and diastolic (congestive) heart failure: Secondary | ICD-10-CM | POA: Diagnosis present

## 2021-10-14 DIAGNOSIS — J449 Chronic obstructive pulmonary disease, unspecified: Secondary | ICD-10-CM | POA: Diagnosis not present

## 2021-10-14 DIAGNOSIS — Z86711 Personal history of pulmonary embolism: Secondary | ICD-10-CM | POA: Diagnosis not present

## 2021-10-14 DIAGNOSIS — I130 Hypertensive heart and chronic kidney disease with heart failure and stage 1 through stage 4 chronic kidney disease, or unspecified chronic kidney disease: Secondary | ICD-10-CM | POA: Diagnosis not present

## 2021-10-14 DIAGNOSIS — J9610 Chronic respiratory failure, unspecified whether with hypoxia or hypercapnia: Secondary | ICD-10-CM | POA: Insufficient documentation

## 2021-10-14 DIAGNOSIS — Z8673 Personal history of transient ischemic attack (TIA), and cerebral infarction without residual deficits: Secondary | ICD-10-CM | POA: Diagnosis not present

## 2021-10-14 DIAGNOSIS — K760 Fatty (change of) liver, not elsewhere classified: Secondary | ICD-10-CM | POA: Diagnosis present

## 2021-10-14 DIAGNOSIS — N182 Chronic kidney disease, stage 2 (mild): Secondary | ICD-10-CM | POA: Diagnosis not present

## 2021-10-14 DIAGNOSIS — Z955 Presence of coronary angioplasty implant and graft: Secondary | ICD-10-CM | POA: Diagnosis not present

## 2021-10-14 DIAGNOSIS — R001 Bradycardia, unspecified: Secondary | ICD-10-CM

## 2021-10-14 DIAGNOSIS — J45909 Unspecified asthma, uncomplicated: Secondary | ICD-10-CM | POA: Diagnosis not present

## 2021-10-14 DIAGNOSIS — E119 Type 2 diabetes mellitus without complications: Secondary | ICD-10-CM | POA: Diagnosis present

## 2021-10-14 DIAGNOSIS — N179 Acute kidney failure, unspecified: Principal | ICD-10-CM | POA: Diagnosis present

## 2021-10-14 DIAGNOSIS — I2510 Atherosclerotic heart disease of native coronary artery without angina pectoris: Secondary | ICD-10-CM | POA: Insufficient documentation

## 2021-10-14 DIAGNOSIS — Z79899 Other long term (current) drug therapy: Secondary | ICD-10-CM | POA: Diagnosis not present

## 2021-10-14 DIAGNOSIS — J9611 Chronic respiratory failure with hypoxia: Secondary | ICD-10-CM | POA: Diagnosis present

## 2021-10-14 DIAGNOSIS — E66813 Obesity, class 3: Secondary | ICD-10-CM | POA: Diagnosis present

## 2021-10-14 DIAGNOSIS — E669 Obesity, unspecified: Secondary | ICD-10-CM | POA: Diagnosis present

## 2021-10-14 DIAGNOSIS — Z794 Long term (current) use of insulin: Secondary | ICD-10-CM | POA: Diagnosis not present

## 2021-10-14 DIAGNOSIS — I509 Heart failure, unspecified: Secondary | ICD-10-CM

## 2021-10-14 DIAGNOSIS — I10 Essential (primary) hypertension: Secondary | ICD-10-CM | POA: Diagnosis present

## 2021-10-14 DIAGNOSIS — Z9989 Dependence on other enabling machines and devices: Secondary | ICD-10-CM

## 2021-10-14 LAB — URINALYSIS, ROUTINE W REFLEX MICROSCOPIC
Bilirubin Urine: NEGATIVE
Glucose, UA: 150 mg/dL — AB
Hgb urine dipstick: NEGATIVE
Ketones, ur: NEGATIVE mg/dL
Leukocytes,Ua: NEGATIVE
Nitrite: NEGATIVE
Protein, ur: 30 mg/dL — AB
Specific Gravity, Urine: 1.026 (ref 1.005–1.030)
pH: 5 (ref 5.0–8.0)

## 2021-10-14 LAB — CBC WITH DIFFERENTIAL/PLATELET
Abs Immature Granulocytes: 0.03 10*3/uL (ref 0.00–0.07)
Basophils Absolute: 0 10*3/uL (ref 0.0–0.1)
Basophils Relative: 0 %
Eosinophils Absolute: 0 10*3/uL (ref 0.0–0.5)
Eosinophils Relative: 0 %
HCT: 37.9 % (ref 36.0–46.0)
Hemoglobin: 11.5 g/dL — ABNORMAL LOW (ref 12.0–15.0)
Immature Granulocytes: 1 %
Lymphocytes Relative: 11 %
Lymphs Abs: 0.7 10*3/uL (ref 0.7–4.0)
MCH: 25.1 pg — ABNORMAL LOW (ref 26.0–34.0)
MCHC: 30.3 g/dL (ref 30.0–36.0)
MCV: 82.6 fL (ref 80.0–100.0)
Monocytes Absolute: 0.3 10*3/uL (ref 0.1–1.0)
Monocytes Relative: 5 %
Neutro Abs: 5.2 10*3/uL (ref 1.7–7.7)
Neutrophils Relative %: 83 %
Platelets: 256 10*3/uL (ref 150–400)
RBC: 4.59 MIL/uL (ref 3.87–5.11)
RDW: 18.4 % — ABNORMAL HIGH (ref 11.5–15.5)
WBC: 6.2 10*3/uL (ref 4.0–10.5)
nRBC: 0 % (ref 0.0–0.2)

## 2021-10-14 LAB — BRAIN NATRIURETIC PEPTIDE: B Natriuretic Peptide: 108.6 pg/mL — ABNORMAL HIGH (ref 0.0–100.0)

## 2021-10-14 LAB — COMPREHENSIVE METABOLIC PANEL
ALT: 13 U/L (ref 0–44)
AST: 14 U/L — ABNORMAL LOW (ref 15–41)
Albumin: 3.6 g/dL (ref 3.5–5.0)
Alkaline Phosphatase: 52 U/L (ref 38–126)
Anion gap: 10 (ref 5–15)
BUN: 55 mg/dL — ABNORMAL HIGH (ref 8–23)
CO2: 26 mmol/L (ref 22–32)
Calcium: 8.6 mg/dL — ABNORMAL LOW (ref 8.9–10.3)
Chloride: 103 mmol/L (ref 98–111)
Creatinine, Ser: 2.52 mg/dL — ABNORMAL HIGH (ref 0.44–1.00)
GFR, Estimated: 21 mL/min — ABNORMAL LOW (ref >60)
Glucose, Bld: 114 mg/dL — ABNORMAL HIGH (ref 70–99)
Potassium: 4.5 mmol/L (ref 3.5–5.1)
Sodium: 139 mmol/L (ref 135–145)
Total Bilirubin: 0.5 mg/dL (ref 0.3–1.2)
Total Protein: 8 g/dL (ref 6.5–8.1)

## 2021-10-14 LAB — LIPASE, BLOOD: Lipase: 27 U/L (ref 11–51)

## 2021-10-14 LAB — RESP PANEL BY RT-PCR (FLU A&B, COVID) ARPGX2
Influenza A by PCR: NEGATIVE
Influenza B by PCR: NEGATIVE
SARS Coronavirus 2 by RT PCR: NEGATIVE

## 2021-10-14 LAB — GLUCOSE, CAPILLARY: Glucose-Capillary: 88 mg/dL (ref 70–99)

## 2021-10-14 LAB — CBG MONITORING, ED: Glucose-Capillary: 106 mg/dL — ABNORMAL HIGH (ref 70–99)

## 2021-10-14 LAB — TROPONIN I (HIGH SENSITIVITY): Troponin I (High Sensitivity): 13 ng/L (ref <18)

## 2021-10-14 MED ORDER — IOHEXOL 9 MG/ML PO SOLN
ORAL | Status: AC
  Administered 2021-10-14: 500 mL via ORAL
  Filled 2021-10-14: qty 1000

## 2021-10-14 MED ORDER — UMECLIDINIUM-VILANTEROL 62.5-25 MCG/ACT IN AEPB
1.0000 | INHALATION_SPRAY | Freq: Every day | RESPIRATORY_TRACT | Status: DC
  Administered 2021-10-15 – 2021-10-17 (×3): 1 via RESPIRATORY_TRACT
  Filled 2021-10-14: qty 14

## 2021-10-14 MED ORDER — INSULIN ASPART 100 UNIT/ML IJ SOLN
0.0000 [IU] | Freq: Three times a day (TID) | INTRAMUSCULAR | Status: DC
  Administered 2021-10-16: 3 [IU] via SUBCUTANEOUS
  Filled 2021-10-14: qty 0.2

## 2021-10-14 MED ORDER — FERROUS SULFATE 325 (65 FE) MG PO TABS
325.0000 mg | ORAL_TABLET | Freq: Every day | ORAL | Status: DC
  Administered 2021-10-15 – 2021-10-16 (×2): 325 mg via ORAL
  Filled 2021-10-14 (×3): qty 1

## 2021-10-14 MED ORDER — EMPAGLIFLOZIN 10 MG PO TABS
10.0000 mg | ORAL_TABLET | Freq: Every day | ORAL | Status: DC
Start: 2021-10-14 — End: 2021-10-14

## 2021-10-14 MED ORDER — BISOPROLOL FUMARATE 5 MG PO TABS
5.0000 mg | ORAL_TABLET | Freq: Every day | ORAL | Status: DC
  Filled 2021-10-14: qty 1

## 2021-10-14 MED ORDER — BISOPROLOL FUMARATE 5 MG PO TABS: 5.0000 mg | ORAL_TABLET | Freq: Every day | ORAL | Status: DC

## 2021-10-14 MED ORDER — LACTATED RINGERS IV BOLUS
1000.0000 mL | Freq: Once | INTRAVENOUS | Status: AC
  Administered 2021-10-14: 1000 mL via INTRAVENOUS

## 2021-10-14 MED ORDER — ONDANSETRON HCL 4 MG/2ML IJ SOLN
4.0000 mg | Freq: Once | INTRAMUSCULAR | Status: AC
  Administered 2021-10-14: 4 mg via INTRAVENOUS
  Filled 2021-10-14: qty 2

## 2021-10-14 MED ORDER — ALBUTEROL SULFATE (2.5 MG/3ML) 0.083% IN NEBU
2.5000 mg | INHALATION_SOLUTION | Freq: Four times a day (QID) | RESPIRATORY_TRACT | Status: DC | PRN
  Administered 2021-10-16 – 2021-10-17 (×7): 2.5 mg via RESPIRATORY_TRACT
  Filled 2021-10-14 (×7): qty 3

## 2021-10-14 MED ORDER — ACETAMINOPHEN 650 MG RE SUPP: 650.0000 mg | Freq: Four times a day (QID) | RECTAL | Status: DC | PRN

## 2021-10-14 MED ORDER — SODIUM CHLORIDE 0.9 % IV BOLUS (SEPSIS)
1000.0000 mL | Freq: Once | INTRAVENOUS | Status: AC
  Administered 2021-10-14: 1000 mL via INTRAVENOUS

## 2021-10-14 MED ORDER — ASPIRIN 81 MG PO TBEC
162.0000 mg | DELAYED_RELEASE_TABLET | Freq: Every day | ORAL | Status: DC
  Administered 2021-10-14 – 2021-10-17 (×4): 162 mg via ORAL
  Filled 2021-10-14 (×4): qty 2

## 2021-10-14 MED ORDER — ONDANSETRON HCL 4 MG/2ML IJ SOLN: 4.0000 mg | Freq: Four times a day (QID) | INTRAMUSCULAR | Status: DC | PRN

## 2021-10-14 MED ORDER — INSULIN DETEMIR 100 UNIT/ML ~~LOC~~ SOLN
30.0000 [IU] | Freq: Every day | SUBCUTANEOUS | Status: DC
  Administered 2021-10-14 – 2021-10-17 (×4): 30 [IU] via SUBCUTANEOUS
  Filled 2021-10-14 (×5): qty 0.3

## 2021-10-14 MED ORDER — ACETAMINOPHEN 325 MG PO TABS
650.0000 mg | ORAL_TABLET | Freq: Four times a day (QID) | ORAL | Status: DC | PRN
  Administered 2021-10-16 – 2021-10-17 (×2): 650 mg via ORAL
  Filled 2021-10-14 (×2): qty 2

## 2021-10-14 MED ORDER — HYDROCERIN EX CREA
1.0000 "application " | TOPICAL_CREAM | Freq: Every day | CUTANEOUS | Status: DC
  Administered 2021-10-15 – 2021-10-17 (×3): 1 via TOPICAL
  Filled 2021-10-14: qty 113

## 2021-10-14 MED ORDER — ROSUVASTATIN CALCIUM 20 MG PO TABS
40.0000 mg | ORAL_TABLET | Freq: Every day | ORAL | Status: DC
Start: 2021-10-14 — End: 2021-10-15
  Administered 2021-10-14: 40 mg via ORAL
  Filled 2021-10-14 (×2): qty 2

## 2021-10-14 MED ORDER — CYCLOSPORINE 0.05 % OP EMUL
1.0000 [drp] | Freq: Every day | OPHTHALMIC | Status: DC | PRN
  Filled 2021-10-14: qty 30

## 2021-10-14 MED ORDER — ISOSORB DINITRATE-HYDRALAZINE 20-37.5 MG PO TABS
1.0000 | ORAL_TABLET | Freq: Three times a day (TID) | ORAL | Status: DC
  Administered 2021-10-15 – 2021-10-17 (×5): 1 via ORAL
  Filled 2021-10-14 (×9): qty 1

## 2021-10-14 MED ORDER — ENOXAPARIN SODIUM 80 MG/0.8ML IJ SOSY
75.0000 mg | PREFILLED_SYRINGE | INTRAMUSCULAR | Status: DC
  Administered 2021-10-15 – 2021-10-16 (×2): 75 mg via SUBCUTANEOUS
  Filled 2021-10-14 (×2): qty 0.8

## 2021-10-14 MED ORDER — ONDANSETRON HCL 4 MG PO TABS: 4.0000 mg | ORAL_TABLET | Freq: Four times a day (QID) | ORAL | Status: DC | PRN

## 2021-10-14 MED ORDER — IOHEXOL 9 MG/ML PO SOLN
500.0000 mL | ORAL | Status: AC
  Administered 2021-10-14: 500 mL via ORAL

## 2021-10-14 MED ORDER — SODIUM CHLORIDE 0.9 % IV SOLN
1000.0000 mL | INTRAVENOUS | Status: DC
  Administered 2021-10-14 – 2021-10-15 (×3): 1000 mL via INTRAVENOUS

## 2021-10-14 MED ORDER — PANTOPRAZOLE SODIUM 40 MG PO TBEC
40.0000 mg | DELAYED_RELEASE_TABLET | Freq: Every day | ORAL | Status: DC
  Administered 2021-10-15 – 2021-10-17 (×3): 40 mg via ORAL
  Filled 2021-10-14 (×3): qty 1

## 2021-10-14 NOTE — ED Notes (Signed)
Patient transported to X-ray 

## 2021-10-14 NOTE — ED Notes (Signed)
Pt provided perennial care, lower bed bath provided, new brief applied, pt repositioned to comfort. ?

## 2021-10-14 NOTE — ED Provider Notes (Signed)
?[facility] DEPT ?Provider Note ? ? ?CSN: [id number] ?Arrival date & time: 10/14/21  [id number] ? ?  ? ?History ? ?Chief Complaint  ?Patient presents with  ? Nausea  ? Shortness of Breath  ? ? ?Tamara Silva is a 61 y.o. female. ? ? ?Shortness of Breath ? ?Patient has history of diabetes, asthma, CHF, hypertension, PE, coronary artery disease, chronic oxygen use, seizures, AICD implantation.  Patient presents emergency room with complaints of nausea vomiting decreased appetite over the last several days. ?Patient states she has not been able to eat or drink much because of her nausea and vomiting.  She denies any abdominal pain.  She denies any diarrhea constipation.  She denies any dysuria.  Patient has also felt feverish.  She has been feeling short of breath.  Patient has been having generalized malaise and was also exposed to someone who had the flu.  Patient also states her left hip has been bothering her.  She denies any recent falls.  She has not noticed any redness or swelling.  She does have some history of chronic back issues.  Patient tried to be evaluated yesterday evening at another emergency room but left prior to evaluation ?Home Medications ?Prior to Admission medications   ?Medication Sig Start Date End Date Taking? Authorizing Provider  ?ACCU-CHEK AVIVA PLUS test strip USE 1 STRIP TO CHECK BLOOD SUGAR 3 TIMES A DAY . 07/28/21   Elayne Snare, MD  ?acetaminophen (TYLENOL) 500 MG tablet Take 500 mg by mouth every 6 (six) hours as needed for moderate pain or headache.    [provider]  ?albuterol (PROVENTIL) (2.5 MG/3ML) 0.083% nebulizer solution Take 3 mLs (2.5 mg total) by nebulization every 6 (six) hours as needed for wheezing or shortness of breath. 08/27/21   Olalere, Adewale A, MD  ?Celedonio Miyamoto 62.5-25 MCG/INH AEPB INHALE 1 PUFF INTO THE LUNGS DAILY. 02/11/21   Laurin Coder, MD  ?ascorbic acid (VITAMIN C) 500 MG tablet TAKE ONE TABLET BY MOUTH ONCE DAILY  04/15/20   Orson Slick, MD  ?ASPIRIN LOW DOSE 81 MG EC tablet CHEW 1 TABLET (81 MG TOTAL) BY MOUTH DAILY. 11/27/19   Ladell Pier, MD  ?BIDIL 20-37.5 MG tablet TAKE 1 TABLET BY MOUTH 3 (THREE) TIMES DAILY. 09/28/21   Larey Dresser, MD  ?bisoprolol (ZEBETA) 5 MG tablet TAKE 1 TABLET (5 MG TOTAL) BY MOUTH DAILY. 04/21/21   Larey Dresser, MD  ?Blood Glucose Monitoring Suppl (ACCU-CHEK GUIDE) w/Device KIT Use accu chek guide to check blood sugar three times daily. 10/09/19   Elayne Snare, MD  ?Continuous Blood Gluc Sensor (FREESTYLE LIBRE 2 SENSOR) MISC USE EVERY 14 (FOURTEEN) DAYS. 07/30/21   Elayne Snare, MD  ?D3-1000 25 MCG (1000 UT) tablet Take 1,000 Units by mouth 2 (two) times daily. 05/07/21   [provider]  ?FEROSUL 325 (65 Fe) MG tablet TAKE 1 TABLET (325 MG TOTAL) BY MOUTH DAILY AFTER SUPPER. PLEASE TAKE WITH A SOURCE OF VITAMIN C 05/14/21   Orson Slick, MD  ?fluticasone (FLONASE) 50 MCG/ACT nasal spray Place 1 spray into both nostrils daily. 09/02/21   Larey Dresser, MD  ?hydrocerin (EUCERIN) CREA Apply 1 application topically 2 (two) times daily. 06/22/18   Love, Ivan Anchors, PA-C  ?insulin degludec (TRESIBA FLEXTOUCH) 200 UNIT/ML FlexTouch Pen INJECT 60 UNITS UNDER THE SKIN ONCE DAILY 09/15/21   Elayne Snare, MD  ?Insulin Degludec (TRESIBA [place]) Inject 60 Units into the skin  daily in the afternoon.    [provider]  ?Insulin Pen Needle (EASY COMFORT PEN NEEDLES) 31G X 5 MM MISC USE 3 TIMES A DAY FOR INSULIN ADMINISTRATION 02/17/21   Elayne Snare, MD  ?ipratropium-albuterol (DUONEB) 0.5-2.5 (3) MG/3ML SOLN INHALE CONTENTS OF ONE VIAL USING NEBULIZER EVERY 6 HOURS AS NEEDED FOR SHORTNESS OF BREATH, WHEEZING 07/27/21   Olalere, Adewale A, MD  ?JARDIANCE 10 MG TABS tablet TAKE ONE TABLET BY MOUTH ONCE DAILY BEFORE BREAKFAST 09/21/21   Elayne Snare, MD  ?NEEDLE, DISP, 26 G 26G X 1/2" MISC 1 application by Does not apply route daily. 06/22/18   Love, Ivan Anchors, PA-C  ?omeprazole (PRILOSEC)  40 MG capsule Take 1 capsule (40 mg total) by mouth daily. 10/01/21   Conrad [place], NP  ?OZEMPIC, 2 MG/DOSE, 8 MG/3ML SOPN INJECT 2 MG AS DIRECTED ONCE A WEEK. 08/24/21   Elayne Snare, MD  ?potassium chloride SA (KLOR-CON M) 20 MEQ tablet Take 1 tablet (20 mEq total) by mouth daily. 09/02/21   Larey Dresser, MD  ?RESTASIS 0.05 % ophthalmic emulsion Place 1 drop into both eyes daily as needed.  12/08/18   [provider]  ?rosuvastatin (CRESTOR) 40 MG tablet TAKE 1 TABLET (40 MG TOTAL) BY MOUTH DAILY AT 6 PM. 08/26/21   Elayne Snare, MD  ?sacubitril-valsartan (ENTRESTO) 24-26 MG Take 1 tablet by mouth 2 (two) times daily. 07/01/21   Larey Dresser, MD  ?spironolactone (ALDACTONE) 25 MG tablet Take 1 tablet by mouth daily. 10/14/20   [provider]  ?torsemide (DEMADEX) 100 MG tablet Take 1 tablet (100 mg total) by mouth in the morning AND 0.5 tablets (50 mg total) every evening. 09/02/21   Larey Dresser, MD  ?vitamin B-12 (CYANOCOBALAMIN) 1000 MCG tablet Take 1,000 mcg by mouth daily.    [provider]  ?Vitamin D, Ergocalciferol, (DRISDOL) 1.25 MG (50000 UNIT) CAPS capsule Take 50,000 Units by mouth 2 (two) times daily.    [provider]  ?   ? ?Allergies    ?Metolazone, Plavix [clopidogrel bisulfate], Ibuprofen, and Nsaids   ? ?Review of Systems   ?Review of Systems  ?Respiratory:  Positive for shortness of breath.   ? ?Physical Exam ?Updated Vital Signs ?BP 133/60   Pulse 76   Temp 98.5 ?F (36.9 ?C) (Oral)   Resp (!) 27   LMP 07/28/2013 Comment: perimenopausal  SpO2 95%  ?Physical Exam ?Vitals and nursing note reviewed.  ?Constitutional:   ?   Appearance: She is well-developed. She is obese. She is not diaphoretic.  ?HENT:  ?   Head: Normocephalic and atraumatic.  ?   Right Ear: External ear normal.  ?   Left Ear: External ear normal.  ?Eyes:  ?   General: No scleral icterus.    ?   Right eye: No discharge.     ?   Left eye: No discharge.  ?   Conjunctiva/sclera: Conjunctivae  normal.  ?Neck:  ?   Trachea: No tracheal deviation.  ?Cardiovascular:  ?   Rate and Rhythm: Normal rate and regular rhythm.  ?Pulmonary:  ?   Effort: Pulmonary effort is normal. No respiratory distress.  ?   Breath sounds: Normal breath sounds. No stridor. No wheezing or rales.  ?Abdominal:  ?   General: Bowel sounds are normal. There is no distension.  ?   Palpations: Abdomen is soft.  ?   Tenderness: There is abdominal tenderness. There is no guarding or rebound.  ?  Comments: Mild tenderness in the upper abdomen, no rebound or guarding  ?Musculoskeletal:     ?   General: No tenderness or deformity.  ?   Cervical back: Neck supple.  ?   Comments: No swelling, no tenderness left hip, no edema noted lower extremities  ?Skin: ?   General: Skin is warm and dry.  ?   Findings: No rash.  ?Neurological:  ?   General: No focal deficit present.  ?   Mental Status: She is alert.  ?   Cranial Nerves: No cranial nerve deficit (no facial droop, extraocular movements intact, no slurred speech).  ?   Sensory: No sensory deficit.  ?   Motor: No abnormal muscle tone or seizure activity.  ?   Coordination: Coordination normal.  ?Psychiatric:     ?   Mood and Affect: Mood normal.  ? ? ?ED Results / Procedures / Treatments   ?Labs ?(all labs ordered are listed, but only abnormal results are displayed) ?Labs Reviewed  ?COMPREHENSIVE METABOLIC PANEL - Abnormal; Notable for the following components:  ?    Result Value  ? Glucose, Bld 114 (*)   ? BUN 55 (*)   ? Creatinine, Ser 2.52 (*)   ? Calcium 8.6 (*)   ? AST 14 (*)   ? GFR, Estimated 21 (*)   ? All other components within normal limits  ?CBC WITH DIFFERENTIAL/PLATELET - Abnormal; Notable for the following components:  ? Hemoglobin 11.5 (*)   ? MCH 25.1 (*)   ? RDW 18.4 (*)   ? All other components within normal limits  ?URINALYSIS, ROUTINE W REFLEX MICROSCOPIC - Abnormal; Notable for the following components:  ? Color, Urine AMBER (*)   ? APPearance CLOUDY (*)   ? Glucose, UA 150  (*)   ? Protein, ur 30 (*)   ? Bacteria, UA RARE (*)   ? All other components within normal limits  ?BRAIN NATRIURETIC PEPTIDE - Abnormal; Notable for the following components:  ? B Natriuretic Peptide 108.6

## 2021-10-14 NOTE — H&P (Signed)
?History and Physical  ? ? ?Patient: Tamara Silva KZS:[id number] DOB: 03-Nov-1960 ?DOA: 10/14/2021 ?DOS: the patient was seen and examined on 10/14/2021 ?PCP: de [place], Raymond J, MD  ?Patient coming from: Home ? ?Chief Complaint:  ?Chief Complaint  ?Patient presents with  ? Nausea  ? Shortness of Breath  ? ?HPI: Tamara Silva is a 61 y.o. female with medical history significant of AICD, asthma, CAD, chronic combined systolic and diastolic heart failure, chronic iron deficiency anemia, history of CVA, type II DM, edema and lymphedema of the legs, hepatic asteatosis, hypertension, history of pulmonary emboli, history of respiratory failure, seizure disorder, class III obesity with a BMI of 51.47 kg/m? who is coming to the emergency department due to nausea with multiple episodes of emesis associated with decreased appetite, fatigue and malaise in the past few days.  She has not been able to drink much in the past 72 hours due to nausea.  She stated she has been even vomiting after having small amounts of water.  She stated that her stomach felt sore, but she has not had significant abdominal pain.  No hematemesis, melena or hematochezia.  No fever, chills, flank pain, dysuria, frequency or hematuria.  No chest pain, palpitations, but hospital dyspneic.  We are also noticing that she used Ozempic on Saturday. ? ?ED course: Initial vital signs were temperature 98.5 ?F, pulse 65, respiration 20, BP 159 mmHg O2 sat 100% on 3 L nasal cannula.  The patient received 1000 mL of normal saline bolus and 4 mg of ondansetron IVP. ? ?Lab work: Urinalysis was cloudy with glucosuria of 150 and proteinuria 30 mg/dL.  There was rare bacteria microscopic examination.  CBC with a white count 6.2, hemoglobin 11.5 g/dL platelets 256.  Lipase was normal.  CMP showed a glucose of 114, BUN 55 and creatinine 2.52 mg/dL.  Her baseline creatinine is usually below 2.0 mg/dL.  Electrolytes are normal when calcium is corrected and LFTs were  unremarkable. ? ?Imaging: Left hip x-ray was limited due to the patient's side.  One-view portable chest radiograph show pulmonary vascular congestion and patchy opacities bilaterally which may reflect edema.  There was cardiomegaly.  CT abdomen/pelvis without contrast with no suspicious etiology for abdominal pain.  There was rounded atelectasis at the left lower lobe with irregular consolidation at the edge of the CT feel a view appears increased in comparison to prior.  This could be progression miscarrying versus superimposed infection.  Since chest CT follow-up recommended in 3 months.  There was questionable right femoral vascular necrosis and MRI imaging should be considered for this in a nonemergent basis.  Aortic atherosclerosis.  Emphysema.  Please see images and full radiology report for further details. ?  ?Review of Systems: As mentioned in the history of present illness. All other systems reviewed and are negative. ?Past Medical History:  ?Diagnosis Date  ? AICD (automatic cardioverter/defibrillator) present 04/04/2019  ? Asthma   ? CAD (coronary artery disease)   ? Chronic combined systolic (congestive) and diastolic (congestive) heart failure (HCC)   ? Chronic iron deficiency anemia   ? CVA (cerebral vascular accident) ([place]) 05/2018  ? Diabetes mellitus without complication ([place])   ? DOE (dyspnea on exertion)   ? Edema of both legs   ? Hepatic steatosis   ? Hypertension   ? Pulmonary emboli ([place]) 2014  ? Respiratory failure ([place]) 09/2018  ? Seizure ([place]) 05/2018  ? ?Past Surgical History:  ?Procedure Laterality Date  ? BIOPSY  07/10/2018  ?  Procedure: BIOPSY;  Surgeon: [name], MD;  Location: [facility];  Service: Gastroenterology;;  ? COLONOSCOPY WITH PROPOFOL N/A 08/03/2013  ? Procedure: COLONOSCOPY WITH PROPOFOL;  Surgeon: Jeryl Columbia, MD;  Location: [facility];  Service: Endoscopy;  Laterality: N/A;  ? COLONOSCOPY WITH PROPOFOL N/A 07/10/2018  ? Procedure: COLONOSCOPY WITH PROPOFOL;   Surgeon: [name], MD;  Location: [facility];  Service: Gastroenterology;  Laterality: N/A;  ? CORONARY ANGIOPLASTY WITH STENT PLACEMENT    ? CAD in 2006 x 2 and 2009 2 more- place din REx in [place] and [place] [facility]  ? CORONARY ANGIOPLASTY WITH STENT PLACEMENT  10/07/2013  ? Xience [place] DES 2.75  mm x 15  mm  ? ESOPHAGOGASTRODUODENOSCOPY (EGD) WITH PROPOFOL N/A 08/03/2013  ? Procedure: ESOPHAGOGASTRODUODENOSCOPY (EGD) WITH PROPOFOL;  Surgeon: Jeryl Columbia, MD;  Location: [facility];  Service: Endoscopy;  Laterality: N/A;  ? ESOPHAGOGASTRODUODENOSCOPY (EGD) WITH PROPOFOL N/A 07/10/2018  ? Procedure: ESOPHAGOGASTRODUODENOSCOPY (EGD) WITH PROPOFOL;  Surgeon: [name], MD;  Location: [facility];  Service: Gastroenterology;  Laterality: N/A;  ? ICD IMPLANT  04/04/2019  ? ICD IMPLANT N/A 04/04/2019  ? Procedure: ICD IMPLANT;  Surgeon: Constance Haw, MD;  Location: [place] [facility];  Service: Cardiovascular;  Laterality: N/A;  ? LEFT HEART CATHETERIZATION WITH CORONARY ANGIOGRAM N/A 10/07/2013  ? Procedure: LEFT HEART CATHETERIZATION WITH CORONARY ANGIOGRAM;  Surgeon: Leonie Man, MD;  Location[facility];  Service: Cardiovascular;  Laterality: N/A;  ? RIGHT HEART CATH N/A 11/06/2018  ? Procedure: RIGHT HEART CATH;  Surgeon: Larey Dresser, MD;  Location: [facility];  Service: Cardiovascular;  Laterality: N/A;  ? RIGHT/LEFT HEART CATH AND CORONARY ANGIOGRAPHY N/A 02/12/2019  ? Procedure: RIGHT/LEFT HEART CATH AND CORONARY ANGIOGRAPHY;  Surgeon: Larey Dresser, MD;  Location: [place] [facility];  Service: Cardiovascular;  Laterality: N/A;  ? ?Social History:  reports that she quit smoking about 3 years ago. Her smoking use included cigarettes. She has a 10.00 pack-year smoking history. She has never used smokeless tobacco. She reports that she does not currently use drugs after having used the following drugs: Cocaine. She reports that she does not drink alcohol. ? ?Allergies  ?Allergen  Reactions  ? Metolazone Other (See Comments)  ?  Dizziness and falling ?Per patient in 2021, states she does not think she fell because of this medication.  ? Plavix [Clopidogrel Bisulfate] Other (See Comments)  ?  High PRU's - non-responder  ? Ibuprofen Other (See Comments)  ?  Pt states she is not supposed to take ibuprofen because of other meds she is taking  ? Nsaids Other (See Comments)  ?  "I do not take NSAIDs d/t interfering with other meds"  ? ? ?Family History  ?Problem Relation Age of Onset  ? Stroke Mother   ? Hypertension Mother   ? Colon cancer Father   ? Diabetes Father   ? Heart attack Father   ? Sarcoidosis Other   ? Breast cancer Paternal Aunt   ? Emphysema Brother   ? Lung disease Brother   ?     Unknown type, 3 brothers, one with liver and lung disease  ? Diabetes Brother   ? Asthma Paternal Aunt   ? Diabetes Sister   ? ? ?Prior to Admission medications   ?Medication Sig Start Date End Date Taking? Authorizing Provider  ?ACCU-CHEK AVIVA PLUS test strip USE 1 STRIP TO CHECK BLOOD SUGAR 3 TIMES A DAY . 07/28/21  Elayne Snare, MD  ?acetaminophen (TYLENOL) 500 MG tablet Take 500 mg by mouth every 6 (six) hours as needed for moderate pain or headache.    [provider]  ?albuterol (PROVENTIL) (2.5 MG/3ML) 0.083% nebulizer solution Take 3 mLs (2.5 mg total) by nebulization every 6 (six) hours as needed for wheezing or shortness of breath. 08/27/21   Olalere, Adewale A, MD  ?Celedonio Miyamoto 62.5-25 MCG/INH AEPB INHALE 1 PUFF INTO THE LUNGS DAILY. 02/11/21   Laurin Coder, MD  ?ascorbic acid (VITAMIN C) 500 MG tablet TAKE ONE TABLET BY MOUTH ONCE DAILY 04/15/20   Orson Slick, MD  ?ASPIRIN LOW DOSE 81 MG EC tablet CHEW 1 TABLET (81 MG TOTAL) BY MOUTH DAILY. 11/27/19   Ladell Pier, MD  ?BIDIL 20-37.5 MG tablet TAKE 1 TABLET BY MOUTH 3 (THREE) TIMES DAILY. 09/28/21   Larey Dresser, MD  ?bisoprolol (ZEBETA) 5 MG tablet TAKE 1 TABLET (5 MG TOTAL) BY MOUTH DAILY. 04/21/21   Larey Dresser, MD  ?Blood Glucose Monitoring Suppl (ACCU-CHEK GUIDE) w/Device KIT Use accu chek guide to check blood sugar three times daily. 10/09/19   Elayne Snare, MD  ?Continuous Blood Gluc Sensor (FREESTYLE LIBRE 2 SENSOR) MISC

## 2021-10-14 NOTE — Telephone Encounter (Signed)
No response from pt regarding CR.  Closed referral.  

## 2021-10-14 NOTE — ED Triage Notes (Signed)
Pt arrives via [facility] from home for nausea, SOB. Pt was seen last night at [facility] for same, but left before evaluation. Pt reports that she had frequent episodes of emesis yesterday. Pt wears 3L O2 via [place] at home, but had to up it to 4 due to SOB. No CP, abdominal pain per pt.  ?

## 2021-10-14 NOTE — ED Notes (Signed)
Pt stated that she needs water to urinate.   Will inform RN. ?

## 2021-10-15 DIAGNOSIS — J9612 Chronic respiratory failure with hypercapnia: Secondary | ICD-10-CM

## 2021-10-15 DIAGNOSIS — N179 Acute kidney failure, unspecified: Secondary | ICD-10-CM | POA: Diagnosis not present

## 2021-10-15 DIAGNOSIS — J9611 Chronic respiratory failure with hypoxia: Secondary | ICD-10-CM

## 2021-10-15 DIAGNOSIS — I5042 Chronic combined systolic (congestive) and diastolic (congestive) heart failure: Secondary | ICD-10-CM

## 2021-10-15 DIAGNOSIS — I10 Essential (primary) hypertension: Secondary | ICD-10-CM

## 2021-10-15 DIAGNOSIS — I2510 Atherosclerotic heart disease of native coronary artery without angina pectoris: Secondary | ICD-10-CM | POA: Diagnosis not present

## 2021-10-15 DIAGNOSIS — Z20822 Contact with and (suspected) exposure to covid-19: Secondary | ICD-10-CM | POA: Diagnosis not present

## 2021-10-15 LAB — BASIC METABOLIC PANEL
Anion gap: 6 (ref 5–15)
BUN: 54 mg/dL — ABNORMAL HIGH (ref 8–23)
CO2: 22 mmol/L (ref 22–32)
Calcium: 7.7 mg/dL — ABNORMAL LOW (ref 8.9–10.3)
Chloride: 108 mmol/L (ref 98–111)
Creatinine, Ser: 2.32 mg/dL — ABNORMAL HIGH (ref 0.44–1.00)
GFR, Estimated: 23 mL/min — ABNORMAL LOW (ref >60)
Glucose, Bld: 113 mg/dL — ABNORMAL HIGH (ref 70–99)
Potassium: 4.2 mmol/L (ref 3.5–5.1)
Sodium: 136 mmol/L (ref 135–145)

## 2021-10-15 LAB — GLUCOSE, CAPILLARY
Glucose-Capillary: 115 mg/dL — ABNORMAL HIGH (ref 70–99)
Glucose-Capillary: 113 mg/dL — ABNORMAL HIGH (ref 70–99)
Glucose-Capillary: 109 mg/dL — ABNORMAL HIGH (ref 70–99)
Glucose-Capillary: 103 mg/dL — ABNORMAL HIGH (ref 70–99)

## 2021-10-15 LAB — CBC
HCT: 34.8 % — ABNORMAL LOW (ref 36.0–46.0)
Hemoglobin: 10.4 g/dL — ABNORMAL LOW (ref 12.0–15.0)
MCH: 25.1 pg — ABNORMAL LOW (ref 26.0–34.0)
MCHC: 29.9 g/dL — ABNORMAL LOW (ref 30.0–36.0)
MCV: 84.1 fL (ref 80.0–100.0)
Platelets: 221 10*3/uL (ref 150–400)
RBC: 4.14 MIL/uL (ref 3.87–5.11)
RDW: 18.6 % — ABNORMAL HIGH (ref 11.5–15.5)
WBC: 4.4 10*3/uL (ref 4.0–10.5)
nRBC: 0 % (ref 0.0–0.2)

## 2021-10-15 LAB — HIV ANTIBODY (ROUTINE TESTING W REFLEX): HIV Screen 4th Generation wRfx: NONREACTIVE

## 2021-10-15 LAB — MAGNESIUM: Magnesium: 1.8 mg/dL (ref 1.7–2.4)

## 2021-10-15 MED ORDER — MAGNESIUM SULFATE 2 GM/50ML IV SOLN
2.0000 g | Freq: Once | INTRAVENOUS | Status: AC
  Administered 2021-10-15: 2 g via INTRAVENOUS
  Filled 2021-10-15: qty 50

## 2021-10-15 MED ORDER — SENNOSIDES-DOCUSATE SODIUM 8.6-50 MG PO TABS
1.0000 | ORAL_TABLET | Freq: Two times a day (BID) | ORAL | Status: DC
  Administered 2021-10-15 – 2021-10-17 (×5): 1 via ORAL
  Filled 2021-10-15 (×5): qty 1

## 2021-10-15 MED ORDER — POLYETHYLENE GLYCOL 3350 17 G PO PACK
17.0000 g | PACK | Freq: Two times a day (BID) | ORAL | Status: DC
  Administered 2021-10-15 – 2021-10-17 (×4): 17 g via ORAL
  Filled 2021-10-15 (×4): qty 1

## 2021-10-15 MED ORDER — ROSUVASTATIN CALCIUM 20 MG PO TABS
40.0000 mg | ORAL_TABLET | Freq: Every day | ORAL | Status: DC
  Administered 2021-10-15 – 2021-10-16 (×2): 40 mg via ORAL
  Filled 2021-10-15: qty 2

## 2021-10-15 MED ORDER — SODIUM CHLORIDE 0.9 % IV SOLN: 1000.0000 mL | INTRAVENOUS | Status: DC

## 2021-10-15 NOTE — TOC Progression Note (Signed)
Transition of Care [facility]) - Progression Note    Patient Details  Name: Tamara Silva MRN: [id number] Date of Birth: 1960-07-01  [facility]) CM/SW Contact  Purcell Mouton, RN Phone Number: 10/15/2021, 2:17 PM  Clinical Narrative:    Spoke with pt concerning WC. A call to [employer] was made to check on WC. Pt received WC 06/27/2018 and is not able to receive another WC for 1 1/2 years. Insurance will not pay for it. A new WC will cost pt $300.00. Explained to the pt this information. Pt states that she can not afford to purchase a WC. Encouraged pt to call [employer] to see if they can repair WC.    Expected Discharge Plan: Home/Self Care Barriers to Discharge: No Barriers Identified  Expected Discharge Plan and Services Expected Discharge Plan: Home/Self Care     Post Acute Care Choice: Durable Medical Equipment Living arrangements for the past 2 months: Single Family Home                                       Social Determinants of Health (SDOH) Interventions    Readmission Risk Interventions     View : No data to display.

## 2021-10-15 NOTE — Consult Note (Deleted)
Cardiology Consultation:   Patient ID: Tamara Silva MRN: [id number]; DOB: 02/21/1961  Admit date: 10/14/2021 Date of Consult: 10/15/2021  PCP:  de [place], Raymond J, MD   [place] Providers Cardiologist:  Fransico Him, MD  Electrophysiologist:  Constance Haw, MD  Advanced Heart Failure:  Loralie Champagne, MD   {   Patient Profile:   Tamara Silva is a 61 y.o. female with a hx of CAD with PCI in 2006 and 2009 and 2015,  ischemic cardiomyopathy, chronic systolic heart failure, PEA arrest 5/20, s/p Medtronic ICD 11/20, OHS/OSA, COPD, CKD III, anemia, hx of GI bleed with unremarkable scope in 2021, PE 2014, type 2 DM, CVA 1/20, HLD, HTN,  pulmonary HTN, paroxysmal A fib, Obesity, who is being seen 10/15/2021 for the evaluation of bradycardia at the request of Dr Tyrell Antonio.   History of Present Illness:   [name] has a hx of CAD (PCI in 2006 and 2009 -unclear vessel) and STEMI 2015 with DES to distal RCA.  Lasts right and left heart cath was on 02/12/19 showed 75% ostial stenosis in a moderate D1, otherwise nonobstructive disease.  No interventional target.  Mildly elevated left and right heart filling pressures with primarily pulmonary venous hypertension and preserved cardiac output. She had hx of GI bleed and anemia in the past. No source of bleeding found on EGD and colonoscopy 2021. Prasugrel was stopped. She is historically continued on ASA 78m and statin and bisoprolol. She had hx of bradycardia on high dose coreg.   She has chronic systolic heart failure, etiology was felt due to ischemic cardiomyopathy. She has hx of recurrent hospitalization for CHF exacerbation. She is currently followed by advanced heart failure team. Echo from 05/31/18 with EF 40-45%, RV moderately dilated, PASP 71 mmHg.  She had PEA arrest 10/28/18 due to hypoxemia 2/2 CHF exacerbation, was diuresed extensively with 65 pounds weight loss. Echo at the time 5/20 showed EF down to 30-35% and mildly dilated RV.  LHC not done due to AKI.  She become oxygen dependent since 11/21/18 hospital discharge. Right heart cath 02/12/19 with mildly elevated left and right heart filling pressures with primarily pulmonary venous hypertension and preserved cardiac output. Echo from  10/20 showed that EF remained 30-35% with wall motion abnormalities, RV appeared normal. Medtronic ICD placed in 11/20. She was lost to follow up since 7/21 and returned to cardiology 1/23 and was not taking metolazone for few months. Echo in 2/23 showed EF 30-35%, dyskinetic apex, severe LV dilation, normal RV size and systolic function. She ws last seen by advaced heart failure clinic 10/01/21, has SOB with exertion, on 2LNC, weight was stable, she was recommend GDMT with torsemide from 100 mg /50 qpm with KCL 270m daily, jardiance 1016maily, Bidil TID, spironolactone 31m73mily, Entresto 24/26 BID, and bisoprolol 5mg 61mly.   She presented to ER 10/14/21 with c/o multiple episodes of nausea with  emesis, poor appetite and poor PO intake, fatigue, malaise for a few days. She reports taking all of her heart failure meds daily. She is very tired now and states she hardly slept last night. She reports weight loss of 30 pounds and denied any worsening of exertional dyspnea, leg edema.  She denies any chest pain, dizziness, syncope.  She is urinating well.   Admission diagnostic showed AKI with BUN 55, Cr 2.52 and GFR 21. BNP 108. Hs trop negative x1. CBC with chronic anemia Hgb 11.5. CXR showed  pulmonary vascular congestion and patchy opacities bilaterally favored  to reflect edema. Cardiomegaly. CTAP showed no acute etiology for abdominal pain, moderate colonic stool burden. Revisualization of calcified pleural plaques with associated rounded atelectasis in the LEFT lower lobe.Query mild RIGHT femoral avascular necrosis. She was admitted for AKI, given IVF, held diuretics and Entresto. Cardiology is consulted today due to new onset of bradycardia.         Past Medical History:  Diagnosis Date   AICD (automatic cardioverter/defibrillator) present 04/04/2019   Asthma    CAD (coronary artery disease)    Chronic combined systolic (congestive) and diastolic (congestive) heart failure (HCC)    Chronic iron deficiency anemia    CVA (cerebral vascular accident) ([place]) 05/2018   Diabetes mellitus without complication ([place])    DOE (dyspnea on exertion)    Edema of both legs    Hepatic steatosis    Hypertension    Pulmonary emboli ([place]) 2014   Respiratory failure ([place]) 09/2018   Seizure ([place]) 05/2018    Past Surgical History:  Procedure Laterality Date   BIOPSY  07/10/2018   Procedure: BIOPSY;  Surgeon: [name], MD;  Location: [facility];  Service: Gastroenterology;;   COLONOSCOPY WITH PROPOFOL N/A 08/03/2013   Procedure: COLONOSCOPY WITH PROPOFOL;  Surgeon: Jeryl Columbia, MD;  Location: [facility];  Service: Endoscopy;  Laterality: N/A;   COLONOSCOPY WITH PROPOFOL N/A 07/10/2018   Procedure: COLONOSCOPY WITH PROPOFOL;  Surgeon: [name], MD;  Location: [facility];  Service: Gastroenterology;  Laterality: N/A;   CORONARY ANGIOPLASTY WITH STENT PLACEMENT     CAD in 2006 x 2 and 2009 2 more- place din REx in [place] and  med   CORONARY ANGIOPLASTY WITH STENT PLACEMENT  10/07/2013   Xience [place] DES 2.75  mm x 15  mm   ESOPHAGOGASTRODUODENOSCOPY (EGD) WITH PROPOFOL N/A 08/03/2013   Procedure: ESOPHAGOGASTRODUODENOSCOPY (EGD) WITH PROPOFOL;  Surgeon: Jeryl Columbia, MD;  Location: [facility];  Service: Endoscopy;  Laterality: N/A;   ESOPHAGOGASTRODUODENOSCOPY (EGD) WITH PROPOFOL N/A 07/10/2018   Procedure: ESOPHAGOGASTRODUODENOSCOPY (EGD) WITH PROPOFOL;  Surgeon: [name], MD;  Location: [facility];  Service: Gastroenterology;  Laterality: N/A;   ICD IMPLANT  04/04/2019   ICD IMPLANT N/A 04/04/2019   Procedure: ICD IMPLANT;  Surgeon: Constance Haw, MD;  Location: [facility];  Service: Cardiovascular;  Laterality:  N/A;   LEFT HEART CATHETERIZATION WITH CORONARY ANGIOGRAM N/A 10/07/2013   Procedure: LEFT HEART CATHETERIZATION WITH CORONARY ANGIOGRAM;  Surgeon: Leonie Man, MD;  Location: [facility];  Service: Cardiovascular;  Laterality: N/A;   RIGHT HEART CATH N/A 11/06/2018   Procedure: RIGHT HEART CATH;  Surgeon: Larey Dresser, MD;  Location: [facility];  Service: Cardiovascular;  Laterality: N/A;   RIGHT/LEFT HEART CATH AND CORONARY ANGIOGRAPHY N/A 02/12/2019   Procedure: RIGHT/LEFT HEART CATH AND CORONARY ANGIOGRAPHY;  Surgeon: Larey Dresser, MD;  Location: [facility];  Service: Cardiovascular;  Laterality: N/A;     Home Medications:  Prior to Admission medications   Medication Sig Start Date End Date Taking? Authorizing Provider  acetaminophen (TYLENOL) 500 MG tablet Take 1,000 mg by mouth every 6 (six) hours as needed for moderate pain or headache.   Yes [provider]  albuterol (PROVENTIL) (2.5 MG/3ML) 0.083% nebulizer solution Take 3 mLs (2.5 mg total) by nebulization every 6 (six) hours as needed for wheezing or shortness of breath. 08/27/21  Yes Olalere, Adewale A, MD  ANORO ELLIPTA 62.5-25 MCG/INH AEPB INHALE 1 PUFF INTO THE LUNGS DAILY.  02/11/21  Yes Olalere, Adewale A, MD  ascorbic acid (VITAMIN C) 500 MG tablet TAKE ONE TABLET BY MOUTH ONCE DAILY Patient taking differently: Take 500 mg by mouth daily. 04/15/20  Yes Ledell Peoples IV, MD  ASPIRIN LOW DOSE 81 MG EC tablet CHEW 1 TABLET (81 MG TOTAL) BY MOUTH DAILY. Patient taking differently: 81 mg daily. 11/27/19  Yes Ladell Pier, MD  BIDIL 20-37.5 MG tablet TAKE 1 TABLET BY MOUTH 3 (THREE) TIMES DAILY. 09/28/21  Yes Larey Dresser, MD  bisoprolol (ZEBETA) 5 MG tablet TAKE 1 TABLET (5 MG TOTAL) BY MOUTH DAILY. 04/21/21  Yes Larey Dresser, MD  D3-1000 25 MCG (1000 UT) tablet Take 1,000 Units by mouth 2 (two) times daily. 05/07/21  Yes [provider]  FEROSUL 325 (65 Fe) MG tablet TAKE 1 TABLET  (325 MG TOTAL) BY MOUTH DAILY AFTER SUPPER. PLEASE TAKE WITH A SOURCE OF VITAMIN C Patient taking differently: 325 mg every evening. 05/14/21  Yes Orson Slick, MD  fluticasone (FLONASE) 50 MCG/ACT nasal spray Place 1 spray into both nostrils daily. 09/02/21  Yes Larey Dresser, MD  hydrocerin (EUCERIN) CREA Apply 1 application topically 2 (two) times daily. Patient taking differently: Apply 1 application. topically daily. 06/22/18  Yes Love, Ivan Anchors, PA-C  insulin degludec (TRESIBA FLEXTOUCH) 200 UNIT/ML FlexTouch Pen INJECT 60 UNITS UNDER THE SKIN ONCE DAILY Patient taking differently: 65 Units daily. 09/15/21  Yes Elayne Snare, MD  ipratropium-albuterol (DUONEB) 0.5-2.5 (3) MG/3ML SOLN INHALE CONTENTS OF ONE VIAL USING NEBULIZER EVERY 6 HOURS AS NEEDED FOR SHORTNESS OF BREATH, WHEEZING Patient taking differently: 3 mLs in the morning, at noon, in the evening, and at bedtime. 07/27/21  Yes Olalere, Adewale A, MD  JARDIANCE 10 MG TABS tablet TAKE ONE TABLET BY MOUTH ONCE DAILY BEFORE BREAKFAST Patient taking differently: 10 mg daily. 09/21/21  Yes Elayne Snare, MD  omeprazole (PRILOSEC) 40 MG capsule Take 1 capsule (40 mg total) by mouth daily. 10/01/21  Yes Clegg, Amy D, NP  OZEMPIC, 2 MG/DOSE, 8 MG/3ML SOPN INJECT 2 MG AS DIRECTED ONCE A WEEK. Patient taking differently: Inject 2 mg into the muscle once a week. 08/24/21  Yes Elayne Snare, MD  potassium chloride SA (KLOR-CON M) 20 MEQ tablet Take 1 tablet (20 mEq total) by mouth daily. 09/02/21  Yes Larey Dresser, MD  RESTASIS 0.05 % ophthalmic emulsion Place 1 drop into both eyes daily as needed (dry eyes). 12/08/18  Yes [provider]  rosuvastatin (CRESTOR) 40 MG tablet TAKE 1 TABLET (40 MG TOTAL) BY MOUTH DAILY AT 6 PM. Patient taking differently: Take 40 mg by mouth daily. 08/26/21  Yes Elayne Snare, MD  sacubitril-valsartan (ENTRESTO) 24-26 MG Take 1 tablet by mouth 2 (two) times daily. 07/01/21  Yes Larey Dresser, MD  spironolactone  (ALDACTONE) 25 MG tablet Take 25 mg by mouth daily. 10/14/20  Yes [provider]  torsemide (DEMADEX) 100 MG tablet Take 1 tablet (100 mg total) by mouth in the morning AND 0.5 tablets (50 mg total) every evening. 09/02/21  Yes Larey Dresser, MD  vitamin B-12 (CYANOCOBALAMIN) 1000 MCG tablet Take 1,000 mcg by mouth daily.   Yes [provider]  ACCU-CHEK AVIVA PLUS test strip USE 1 STRIP TO CHECK BLOOD SUGAR 3 TIMES A DAY . 07/28/21   Elayne Snare, MD  Blood Glucose Monitoring Suppl (ACCU-CHEK GUIDE) w/Device KIT Use accu chek guide to check blood sugar three times daily. 10/09/19  Elayne Snare, MD  Continuous Blood Gluc Sensor (FREESTYLE LIBRE 2 SENSOR) MISC USE EVERY 14 (FOURTEEN) DAYS. 07/30/21   Elayne Snare, MD  Insulin Pen Needle (EASY COMFORT PEN NEEDLES) 31G X 5 MM MISC USE 3 TIMES A DAY FOR INSULIN ADMINISTRATION 02/17/21   Elayne Snare, MD  NEEDLE, DISP, 26 G 26G X 1/2" MISC 1 application by Does not apply route daily. 06/22/18   Bary Leriche, PA-C    Inpatient Medications: Scheduled Meds:  aspirin EC  162 mg Oral Daily   enoxaparin (LOVENOX) injection  75 mg Subcutaneous Q24H   ferrous sulfate  325 mg Oral Q breakfast   hydrocerin  1 application. Topical Daily   insulin aspart  0-20 Units Subcutaneous TID WC   insulin detemir  30 Units Subcutaneous Daily   isosorbide-hydrALAZINE  1 tablet Oral TID   pantoprazole  40 mg Oral Daily   polyethylene glycol  17 g Oral BID   rosuvastatin  40 mg Oral QPC supper   senna-docusate  1 tablet Oral BID   umeclidinium-vilanterol  1 puff Inhalation Daily   Continuous Infusions:  magnesium sulfate bolus IVPB 2 g (10/15/21 1525)   PRN Meds: acetaminophen **OR** acetaminophen, albuterol, cycloSPORINE, ondansetron **OR** ondansetron (ZOFRAN) IV  Allergies:    Allergies  Allergen Reactions   Metolazone Other (See Comments)    Dizziness and falling Per patient in 2021, states she does not think she fell because of this  medication.   Plavix [Clopidogrel Bisulfate] Other (See Comments)    High PRU's - non-responder   Ibuprofen Other (See Comments)    Pt states she is not supposed to take ibuprofen because of other meds she is taking   Nsaids Other (See Comments)    "I do not take NSAIDs d/t interfering with other meds"    Social History:   Social History   Socioeconomic History   Marital status: Single    Spouse name: Not on file   Number of children: 1   Years of education: Not on file   Highest education level: Not on file  Occupational History   Not on file  Tobacco Use   Smoking status: Former    Packs/day: 0.50    Years: 20.00    Pack years: 10.00    Types: Cigarettes    Quit date: 05/31/2018    Years since quitting: 3.3   Smokeless tobacco: Never   Tobacco comments:    smoked off and on x 20 years  Vaping Use   Vaping Use: Never used  Substance and Sexual Activity   Alcohol use: No    Alcohol/week: 0.0 standard drinks   Drug use: Not Currently    Types: Cocaine   Sexual activity: Not Currently  Other Topics Concern   Not on file  Social History Narrative   Origibnally from [place]   Most recently from [place]   Daughter lives in town      On Social security to CHF, COPD, CAD   Social Determinants of Health   Financial Resource Strain: Not on file  Food Insecurity: Not on file  Transportation Needs: Not on file  Physical Activity: Not on file  Stress: Not on file  Social Connections: Not on file  Intimate Partner Violence: Not on file    Family History:    Family History  Problem Relation Age of Onset   Stroke Mother    Hypertension Mother    Colon cancer Father    Diabetes Father  Heart attack Father    Sarcoidosis Other    Breast cancer Paternal Aunt    Emphysema Brother    Lung disease Brother        Unknown type, 3 brothers, one with liver and lung disease   Diabetes Brother    Asthma Paternal Aunt    Diabetes Sister      ROS:  Constitutional:  Denied fever, chills, malaise, night sweats Eyes: Denied vision change or loss Ears/Nose/Mouth/Throat: Denied ear ache, sore throat, coughing, sinus pain Cardiovascular: See HPI Respiratory: denied shortness of breath Gastrointestinal: See HPI Genital/Urinary: Denied dysuria, hematuria, urinary frequency/urgency Musculoskeletal: Denied muscle ache, joint pain, weakness Skin: Denied rash, wound Neuro: Denied headache, dizziness, syncope Psych: Denied history of depression/anxiety  Endocrine: history of diabetes     Physical Exam/Data:   Vitals:   10/15/21 1205 10/15/21 1211 10/15/21 1219 10/15/21 1400  BP: 113/63     Pulse: (!) 45  (!) 48   Resp: 18  (!) 26 15  Temp: 98.4 F (36.9 C)     TempSrc: Oral     SpO2: 98% 99% 99%     Intake/Output Summary (Last 24 hours) at 10/15/2021 1543 Last data filed at 10/15/2021 1246 Gross per 24 hour  Intake 2293.53 ml  Output --  Net 2293.53 ml       10/13/2021    8:24 PM 10/01/2021    9:17 AM 09/02/2021   10:56 AM  Last 3 Weights  Weight (lbs) 333 lb 8.9 oz 333 lb 9.6 oz 334 lb  Weight (kg) 151.3 kg 151.32 kg 151.501 kg     There is no height or weight on file to calculate BMI.  Vitals:  Vitals:   10/15/21 1219 10/15/21 1400  BP:    Pulse: (!) 48   Resp: (!) 26 15  Temp:    SpO2: 99%    General Appearance: In no apparent distress, laying in bed, morbidly obese HEENT: Normocephalic, atraumatic. Neck: Supple, trachea midline, no JVD Cardiovascular: Regular rate and rhythm, normal S1-S2,  no murmur/rub/gallop Respiratory: Resting breathing unlabored, lungs sounds clear to auscultation bilaterally, no use of accessory muscles. On room air.  No wheezes, rales or rhonchi.   Gastrointestinal: Bowel sounds positive, abdomen soft Extremities: Able to move all extremities in bed without difficulty, no edema Musculoskeletal: Normal muscle bulk and tone Skin: Intact, warm, dry. No rashes or petechiae noted in exposed areas.  Neurologic:  Alert, oriented to person, place and time. Fluent speech, no facial droop, no cognitive deficit Psychiatric: Normal affect. Mood is appropriate.     EKG:  The EKG was personally reviewed and demonstrates: EKG from 10/15/2021 at 1018 revealed junctional rhythm 46 bpm  Telemetry:  Telemetry was personally reviewed and demonstrates: Junctional bradycardia mid 40s   Relevant CV Studies:  Echocardiogram from 07/28/2021:  1. No left ventricular thrombus (with Definity contrast). Dyskinetic left  ventricular apex and overall wall motion consistent with an extensive old  infarct in the LAD artery distribution. Left ventricular ejection  fraction, by estimation, is 30 to 35%.  The left ventricle has moderately decreased function. The left ventricle  demonstrates regional wall motion abnormalities (see scoring  diagram/findings for description). The left ventricular internal cavity  size was severely dilated. Left ventricular  diastolic parameters are consistent with Grade II diastolic dysfunction  (pseudonormalization). Elevated left atrial pressure.   2. Right ventricular systolic function is normal. The right ventricular  size is normal.   3. Left atrial size was mildly dilated.  4. The mitral valve is normal in structure. No evidence of mitral valve  regurgitation.   5. The aortic valve is tricuspid. Aortic valve regurgitation is not  visualized. No aortic stenosis is present.   6. The inferior vena cava is normal in size with greater than 50%  respiratory variability, suggesting right atrial pressure of 3 mmHg.   Comparison(s): Prior images reviewed side by side. The left ventricular  function is unchanged. The left ventricular diastolic function is  significantly worse. There is now evidence of elevated mean left atrial  pressure.   Right and left heart catheterization from 02/12/2019:  1. Mildly elevated left and right heart filling pressures with primarily pulmonary venous  hypertension and preserved cardiac output.  2. 75% ostial stenosis in a moderate D1, otherwise nonobstructive disease.  No interventional target.     I will increase torsemide to 60 mg bid with elevated filling pressures.    Laboratory Data:  High Sensitivity Troponin:   Recent Labs  Lab 10/14/21 0901  TROPONINIHS 13      Chemistry Recent Labs  Lab 10/13/21 2031 10/14/21 0901 10/15/21 0647 10/15/21 1000  NA 138 139 136  --   K 5.2* 4.5 4.2  --   CL 101 103 108  --   CO2 27 26 22   --   GLUCOSE 106* 114* 113*  --   BUN 39* 55* 54*  --   CREATININE 1.73* 2.52* 2.32*  --   CALCIUM 9.5 8.6* 7.7*  --   MG  --   --   --  1.8  GFRNONAA 33* 21* 23*  --   ANIONGAP 10 10 6   --      Recent Labs  Lab 10/13/21 2031 10/14/21 0901  PROT 8.7* 8.0  ALBUMIN 3.9 3.6  AST 16 14*  ALT 12 13  ALKPHOS 59 52  BILITOT 0.4 0.5    Lipids No results for input(s): CHOL, TRIG, HDL, LABVLDL, LDLCALC, CHOLHDL in the last 168 hours.  Hematology Recent Labs  Lab 10/13/21 2031 10/14/21 0901 10/15/21 0647  WBC 7.0 6.2 4.4  RBC 4.81 4.59 4.14  HGB 11.6* 11.5* 10.4*  HCT 39.0 37.9 34.8*  MCV 81.1 82.6 84.1  MCH 24.1* 25.1* 25.1*  MCHC 29.7* 30.3 29.9*  RDW 18.5* 18.4* 18.6*  PLT 298 256 221    Thyroid No results for input(s): TSH, FREET4 in the last 168 hours.  BNP Recent Labs  Lab 10/14/21 0901  BNP 108.6*     DDimer No results for input(s): DDIMER in the last 168 hours.   Radiology/Studies:  CT ABDOMEN PELVIS WO CONTRAST  Result Date: 10/14/2021 CLINICAL DATA:  Abdominal pain, acute, nonlocalized EXAM: CT ABDOMEN AND PELVIS WITHOUT CONTRAST TECHNIQUE: Multidetector CT imaging of the abdomen and pelvis was performed following the standard protocol without IV contrast. RADIATION DOSE REDUCTION: This exam was performed according to the departmental dose-optimization program which includes automated exposure control, adjustment of the mA and/or kV according to patient size  and/or use of iterative reconstruction technique. COMPARISON:  CT dated May 29, 2018; February 01, 2014. FINDINGS: Evaluation is limited by lack of IV contrast. Lower chest: Revisualization calcified pleural plaques in the LEFT lower lobe. There is revisualization of several areas of bandlike opacities of bilateral bases most consistent with scarring/rounded atelectasis, mildly increased since 2015. Irregular opacity superior to the rounded atelectasis appears increased in comparison to prior and spans approximately 25 mm (series 2, image 1). Patchy centrilobular nodularity of the RIGHT lower  lobe is similar comparison to prior. Paraseptal emphysema. Cardiomegaly. Hepatobiliary: Hepatic steatosis. Gallbladder is unremarkable. Coarse calcification along the hepatic dome, unchanged. Pancreas: No peripancreatic fat stranding. Spleen: Unremarkable. Adrenals/Urinary Tract: Adrenal glands are unremarkable. No hydronephrosis. No obstructing nephrolithiasis bladder is decompressed. Stomach/Bowel: No evidence of bowel obstruction. Moderate colonic stool burden diffusely throughout the colon. Appendix is normal. Stomach is decompressed. Likely tiny hiatal hernia. Vascular/Lymphatic: Abdominal aorta is normal in course and caliber with scattered atherosclerotic calcifications. Revisualization of multiple prominent subcentimeter retroperitoneal lymph nodes. Representative aortocaval lymph node measures 9 mm in the short axis, unchanged in comparison to prior (series 2, image 51). Reproductive: Uterus and bilateral adnexa are unremarkable. Other: No free air or free fluid. Musculoskeletal: Faint serpiginous sclerosis of the RIGHT femoral head may reflect underlying avascular necrosis. No evidence of femoral head flattening. IMPRESSION: 1. No suspicious CT etiology for acute abdominal pain is identified. Moderate colonic stool burden. 2. Revisualization of calcified pleural plaques with associated rounded atelectasis in the  LEFT lower lobe. One area of 25 mm irregular consolidation at the edge of the CT field of view appears increased in comparison to prior. This is nonspecific and could reflect progression of scarring versus superimposed infection. Recommend follow-up CT chest in 3 months to assess for evolution. 3. Query mild RIGHT femoral avascular necrosis. This could be further assessed with dedicated nonemergent MRI if clinically indicated. Aortic Atherosclerosis (ICD10-I70.0) and Emphysema (ICD10-J43.9). Electronically Signed   By: Valentino Saxon M.D.   On: 10/14/2021 13:28   DG Chest 1 View  Result Date: 10/14/2021 CLINICAL DATA:  Shortness of breath EXAM: CHEST  1 VIEW COMPARISON:  04/25/2021 FINDINGS: Pulmonary vascular congestion and patchy opacities bilaterally. No significant pleural effusion. Cardiomegaly. Left chest wall ICD. IMPRESSION: Pulmonary vascular congestion and patchy opacities bilaterally favored to reflect edema. Cardiomegaly. Electronically Signed   By: Macy Mis M.D.   On: 10/14/2021 09:54   DG Hip Unilat W or Wo Pelvis 2-3 Views Left  Result Date: 10/14/2021 CLINICAL DATA:  Left hip pain EXAM: DG HIP (WITH OR WITHOUT PELVIS) 2-3V LEFT COMPARISON:  None Available. FINDINGS: Evaluation is limited due to overlying soft tissues which significantly degrades image quality. No displaced fracture identified. Left hip joint space normal. Much of the pelvis obscured due to overlying soft tissues and inadequate technique. IMPRESSION: Limited study due to patient size. No displaced fracture left hip. Consider CT if symptoms continue. Electronically Signed   By: Franchot Gallo M.D.   On: 10/14/2021 10:06     Assessment and Plan:   Junctional bradycardia, asymptomatic -Stop home medication bisoprolol -Continue telemetry monitor -Treat underlying cause of nausea with emesis  Acute kidney injury -Likely prerenal from nausea with vomiting and poor oral intake -On IV fluids, agree with holding  Entresto and diuretics -Trend renal index  Chronic systolic heart failure Ischemic cardiomyopathy -GDMT include spironolactone, BiDil, Jardiance, Entresto, bisoprolol -Holding medication as above -No clinical evidence of decompensation at this time  CAD -No chest pain, high sensitive troponin negative x1 -Continue medical therapy with aspirin and Crestor  COPD OSA Type 2 diabetes Chronic anemia Hypertension Hyperlipidemia -Managed per medicine service   Risk Assessment/Risk Scores:     For questions or updates, please contact [facility] Please consult [email or web address] for contact info under    Signed, Freada Bergeron, MD  10/15/2021 3:43 PM  Patient seen and examined and agree with Margie Billet, NP as detailed above.  In brief, the patient is a 61 y.o. female with a  hx of CAD with PCI in 2006 and 2009 and 2015,  ischemic cardiomyopathy, chronic systolic heart failure, PEA arrest 5/20, s/p Medtronic ICD 11/20, OHS/OSA, COPD, CKD III, anemia, hx of GI bleed with unremarkable scope in 2021, PE 2014, type 2 DM, CVA 1/20, HLD, HTN,  pulmonary HTN, paroxysmal A fib, Obesity who presented to the ER with nausea, vomiting, poor PO intake and generalized malaise. Course complicated by junctional bradycardia for which Cardiology has been consulted.  Patient is followed by heart failure team as outpatient. Has history of chronic systolic heart failure thought to be ischemic in nature with history of RCA STEMI in the past. Last TTE 07/2021 with EF 30-35%, dyskinetic apex, severe LV dilation, normal RV size and systolic function. She is on chronic O2 at home. Notably, has history of bradycardia with coreg in the past which was stopped. She has been on bisoprolol. The patient reports that she has been compliant with all her medications.  She presented on this admission with nausea, vomiting and poor PO intake. She states that she was visiting family out of town and was trying to avoid  drinking water to prevent her from going to the bathroom. She was continuing to take all her heart failure medications including her torsemide. On admission here, she had an AKI with Cr 2.5 that improved to 2.3 today after IVF. Review of telemetry reveals she is in a junctional rhythm with HR 40s while on her bisoprolol. She is otherwise HD stable and asymptomatic from a cardiac perspective.   GEN: No acute distress. On chronic O2  Neck: JVD difficult to assess due to body habitus Cardiac: Bradycardic, regular, no murmurs Respiratory: Diminshed but clear GI: Obese, soft, ND, NTTP  MS: No edema; No deformity. Warm Neuro:  Nonfocal  Psych: Normal affect    Plan: -Stop bisoprolol  -Holding GDMT due to AKI, resume once able -Does not appear acutely decompensated from HF standpoint  -No evidence of ACS and trop negative -Has history of bradycardia with BB in the past -Continue management of nausea and AKI per primary  Gwyndolyn Kaufman, MD

## 2021-10-15 NOTE — Progress Notes (Signed)
PROGRESS NOTE    Tamara Silva  PPJ:[id number] DOB: Jul 29, 1960 DOA: 10/14/2021 PCP: de [place], Raymond J, MD   Brief Narrative: 61 year old with past medical history significant for AICD, asthma, CAD, chronic combined systolic and diastolic heart failure, chronic iron deficiency anemia, history of CVA, type 2 diabetes, lymphedema, hepatic asteatosis, hypertension, history of PE history of respiratory failure, seizure disorder, class III obesity who presents complaining of multiple episode of nausea, decreased appetite, fatigue and malaise for the past few days.  Reports poor oral intake.  She uses Ozempic last Saturday.  She has been on Ozempic for 4 months now.  She was found to have AKI on CKD.  Admitted for hydration.  She was also found to be bradycardic junctional rhythm cardiology consulted.   Assessment & Plan:   Principal Problem:   AKI (acute kidney injury) ([place]) Active Problems:   Essential hypertension   Chronic combined systolic and diastolic heart failure ([name])   COPD GOLD III    Dyslipidemia   Diabetes mellitus type 2 in obese ([facility])   Chronic respiratory failure with hypoxia and hypercapnia ([facility])   Hepatic steatosis   BiPAP (biphasic positive airway pressure) dependence   Class 3 obesity ([place])  1-AKI on CKD stage IIb In the setting poor  oral intake and vomiting.  -Patient creatinine baseline 1.9---2.1 -She presented with a creatinine of 2.5 -she received IV hydration. Stop IV fluids.  Monitor renal function.   2-Abdominal pain nausea and vomiting: Could be related to constipation, gastroenteritis versus Ozempic use.  Although she has been on Ozempic for 4 months now Denies further episode of vomiting.  Bowel regimen order.  Advanced diet.   3-COPD Gold 3, chronic respiratory failure with hypoxia and hypercapnia Continue with supplemental oxygen Continue Anoro Ellipta Continue bronchodilators  Hypertension: Hold Entresto due to AKI.  Hold bisoprolol due to  bradycardia  Chronic combined systolic and diastolic heart failure: Compensated Plan to hold medications  Dyslipidemia: Continue with rosuvastatin  Bradycardia, junctional rhythm Cardiology consulted      Estimated body mass index is 51.47 kg/m as calculated from the following:   Height as of 10/13/21: 5' 7.5" (1.715 m).   Weight as of 10/13/21: 151.3 kg.   DVT prophylaxis: Lovenox Code Status: Full code Family Communication: Care discussed with patient Disposition Plan:  Status is: Observation The patient remains OBS appropriate and will d/c before 2 midnights.    Consultants:  Cardiology   Procedures:    Antimicrobials:    Subjective: She reports improvement of nausea and vomiting.  She is wanting to try regular diet.  She had a small bowel movement today, she feels like she could benefit from more bowel movement  Objective: Vitals:   10/15/21 0006 10/15/21 0206 10/15/21 0311 10/15/21 0611  BP: 105/60 (!) 117/57  103/67  Pulse: (!) 51 (!) 52 (!) 52 (!) 50  Resp: 20 20 (!) 23 20  Temp: 98.2 F (36.8 C) 97.7 F (36.5 C)  98.7 F (37.1 C)  TempSrc: Oral     SpO2: 100% 99% 99% 100%    Intake/Output Summary (Last 24 hours) at 10/15/2021 1035 Last data filed at 10/15/2021 0400 Gross per 24 hour  Intake 1693.53 ml  Output 100 ml  Net 1593.53 ml   There were no vitals filed for this visit.  Examination:  General exam: Appears calm and comfortable  Respiratory system: Clear to auscultation. Respiratory effort normal. Cardiovascular system: S1 & S2 heard, RRR. No JVD, murmurs, rubs, gallops or clicks.  No pedal edema. Gastrointestinal system: Abdomen is nondistended, soft and nontender. No organomegaly or masses felt. Normal bowel sounds heard. Central nervous system: Alert and oriented. No focal neurological deficits. Extremities: Symmetric 5 x 5 power.   Data Reviewed: I have personally reviewed following labs and imaging studies  CBC: Recent Labs   Lab 10/13/21 2031 10/14/21 0901 10/15/21 0647  WBC 7.0 6.2 4.4  NEUTROABS  --  5.2  --   HGB 11.6* 11.5* 10.4*  HCT 39.0 37.9 34.8*  MCV 81.1 82.6 84.1  PLT 298 256 660   Basic Metabolic Panel: Recent Labs  Lab 10/13/21 2031 10/14/21 0901 10/15/21 0647 10/15/21 1000  NA 138 139 136  --   K 5.2* 4.5 4.2  --   CL 101 103 108  --   CO2 '27 26 22  '$ --   GLUCOSE 106* 114* 113*  --   BUN 39* 55* 54*  --   CREATININE 1.73* 2.52* 2.32*  --   CALCIUM 9.5 8.6* 7.7*  --   MG  --   --   --  1.8   GFR: Estimated Creatinine Clearance: 39.5 mL/min (A) (by C-G formula based on SCr of 2.32 mg/dL (H)). Liver Function Tests: Recent Labs  Lab 10/13/21 2031 10/14/21 0901  AST 16 14*  ALT 12 13  ALKPHOS 59 52  BILITOT 0.4 0.5  PROT 8.7* 8.0  ALBUMIN 3.9 3.6   Recent Labs  Lab 10/13/21 2031 10/14/21 0901  LIPASE 43 27   No results for input(s): AMMONIA in the last 168 hours. Coagulation Profile: No results for input(s): INR, PROTIME in the last 168 hours. Cardiac Enzymes: No results for input(s): CKTOTAL, CKMB, CKMBINDEX, TROPONINI in the last 168 hours. BNP (last 3 results) No results for input(s): PROBNP in the last 8760 hours. HbA1C: No results for input(s): HGBA1C in the last 72 hours. CBG: Recent Labs  Lab 10/14/21 0851 10/14/21 2329 10/15/21 0836  GLUCAP 106* 88 115*   Lipid Profile: No results for input(s): CHOL, HDL, LDLCALC, TRIG, CHOLHDL, LDLDIRECT in the last 72 hours. Thyroid Function Tests: No results for input(s): TSH, T4TOTAL, FREET4, T3FREE, THYROIDAB in the last 72 hours. Anemia Panel: No results for input(s): VITAMINB12, FOLATE, FERRITIN, TIBC, IRON, RETICCTPCT in the last 72 hours. Sepsis Labs: No results for input(s): PROCALCITON, LATICACIDVEN in the last 168 hours.  Recent Results (from the past 240 hour(s))  Resp Panel by RT-PCR (Flu A&B, Covid) Nasopharyngeal Swab     Status: None   Collection Time: 10/14/21  9:01 AM   Specimen:  Nasopharyngeal Swab; Nasopharyngeal(NP) swabs in vial transport medium  Result Value Ref Range Status   SARS Coronavirus 2 by RT PCR NEGATIVE NEGATIVE Final    Comment: (NOTE) SARS-CoV-2 target nucleic acids are NOT DETECTED.  The SARS-CoV-2 RNA is generally detectable in upper respiratory specimens during the acute phase of infection. The lowest concentration of SARS-CoV-2 viral copies this assay can detect is 138 copies/mL. A negative result does not preclude SARS-Cov-2 infection and should not be used as the sole basis for treatment or other patient management decisions. A negative result may occur with  improper specimen collection/handling, submission of specimen other than nasopharyngeal swab, presence of viral mutation(s) within the areas targeted by this assay, and inadequate number of viral copies(<138 copies/mL). A negative result must be combined with clinical observations, patient history, and epidemiological information. The expected result is Negative.  Fact Sheet for Patients:  EntrepreneurPulse.com.au  Fact Sheet for Healthcare Providers:  IncredibleEmployment.be  This test is no t yet approved or cleared by the [place] and  has been authorized for detection and/or diagnosis of SARS-CoV-2 by FDA under an Emergency Use Authorization (EUA). This EUA will remain  in effect (meaning this test can be used) for the duration of the COVID-19 declaration under Section 564(b)(1) of the Act, 21 U.S.C.section 360bbb-3(b)(1), unless the authorization is terminated  or revoked sooner.       Influenza A by PCR NEGATIVE NEGATIVE Final   Influenza B by PCR NEGATIVE NEGATIVE Final    Comment: (NOTE) The Xpert Xpress SARS-CoV-2/FLU/RSV plus assay is intended as an aid in the diagnosis of influenza from Nasopharyngeal swab specimens and should not be used as a sole basis for treatment. Nasal washings and aspirates are unacceptable for  Xpert Xpress SARS-CoV-2/FLU/RSV testing.  Fact Sheet for Patients: EntrepreneurPulse.com.au  Fact Sheet for Healthcare Providers: IncredibleEmployment.be  This test is not yet approved or cleared by the [place] FDA and has been authorized for detection and/or diagnosis of SARS-CoV-2 by FDA under an Emergency Use Authorization (EUA). This EUA will remain in effect (meaning this test can be used) for the duration of the COVID-19 declaration under Section 564(b)(1) of the Act, 21 U.S.C. section 360bbb-3(b)(1), unless the authorization is terminated or revoked.  Performed at [facility], [place] [address] [place], [place] [address]          Radiology Studies: CT ABDOMEN PELVIS WO CONTRAST  Result Date: 10/14/2021 CLINICAL DATA:  Abdominal pain, acute, nonlocalized EXAM: CT ABDOMEN AND PELVIS WITHOUT CONTRAST TECHNIQUE: Multidetector CT imaging of the abdomen and pelvis was performed following the standard protocol without IV contrast. RADIATION DOSE REDUCTION: This exam was performed according to the departmental dose-optimization program which includes automated exposure control, adjustment of the mA and/or kV according to patient size and/or use of iterative reconstruction technique. COMPARISON:  CT dated May 29, 2018; February 01, 2014. FINDINGS: Evaluation is limited by lack of IV contrast. Lower chest: Revisualization calcified pleural plaques in the LEFT lower lobe. There is revisualization of several areas of bandlike opacities of bilateral bases most consistent with scarring/rounded atelectasis, mildly increased since 2015. Irregular opacity superior to the rounded atelectasis appears increased in comparison to prior and spans approximately 25 mm (series 2, image 1). Patchy centrilobular nodularity of the RIGHT lower lobe is similar comparison to prior. Paraseptal emphysema. Cardiomegaly. Hepatobiliary: Hepatic  steatosis. Gallbladder is unremarkable. Coarse calcification along the hepatic dome, unchanged. Pancreas: No peripancreatic fat stranding. Spleen: Unremarkable. Adrenals/Urinary Tract: Adrenal glands are unremarkable. No hydronephrosis. No obstructing nephrolithiasis bladder is decompressed. Stomach/Bowel: No evidence of bowel obstruction. Moderate colonic stool burden diffusely throughout the colon. Appendix is normal. Stomach is decompressed. Likely tiny hiatal hernia. Vascular/Lymphatic: Abdominal aorta is normal in course and caliber with scattered atherosclerotic calcifications. Revisualization of multiple prominent subcentimeter retroperitoneal lymph nodes. Representative aortocaval lymph node measures 9 mm in the short axis, unchanged in comparison to prior (series 2, image 51). Reproductive: Uterus and bilateral adnexa are unremarkable. Other: No free air or free fluid. Musculoskeletal: Faint serpiginous sclerosis of the RIGHT femoral head may reflect underlying avascular necrosis. No evidence of femoral head flattening. IMPRESSION: 1. No suspicious CT etiology for acute abdominal pain is identified. Moderate colonic stool burden. 2. Revisualization of calcified pleural plaques with associated rounded atelectasis in the LEFT lower lobe. One area of 25 mm irregular consolidation at the edge of the CT field of view appears increased in comparison to prior.  This is nonspecific and could reflect progression of scarring versus superimposed infection. Recommend follow-up CT chest in 3 months to assess for evolution. 3. Query mild RIGHT femoral avascular necrosis. This could be further assessed with dedicated nonemergent MRI if clinically indicated. Aortic Atherosclerosis (ICD10-I70.0) and Emphysema (ICD10-J43.9). Electronically Signed   By: Valentino Saxon M.D.   On: 10/14/2021 13:28   DG Chest 1 View  Result Date: 10/14/2021 CLINICAL DATA:  Shortness of breath EXAM: CHEST  1 VIEW COMPARISON:  04/25/2021  FINDINGS: Pulmonary vascular congestion and patchy opacities bilaterally. No significant pleural effusion. Cardiomegaly. Left chest wall ICD. IMPRESSION: Pulmonary vascular congestion and patchy opacities bilaterally favored to reflect edema. Cardiomegaly. Electronically Signed   By: Macy Mis M.D.   On: 10/14/2021 09:54   DG Hip Unilat W or Wo Pelvis 2-3 Views Left  Result Date: 10/14/2021 CLINICAL DATA:  Left hip pain EXAM: DG HIP (WITH OR WITHOUT PELVIS) 2-3V LEFT COMPARISON:  None Available. FINDINGS: Evaluation is limited due to overlying soft tissues which significantly degrades image quality. No displaced fracture identified. Left hip joint space normal. Much of the pelvis obscured due to overlying soft tissues and inadequate technique. IMPRESSION: Limited study due to patient size. No displaced fracture left hip. Consider CT if symptoms continue. Electronically Signed   By: Franchot Gallo M.D.   On: 10/14/2021 10:06        Scheduled Meds:  aspirin EC  162 mg Oral Daily   enoxaparin (LOVENOX) injection  75 mg Subcutaneous Q24H   ferrous sulfate  325 mg Oral Q breakfast   hydrocerin  1 application. Topical Daily   insulin aspart  0-20 Units Subcutaneous TID WC   insulin detemir  30 Units Subcutaneous Daily   isosorbide-hydrALAZINE  1 tablet Oral TID   pantoprazole  40 mg Oral Daily   polyethylene glycol  17 g Oral BID   rosuvastatin  40 mg Oral QPC supper   senna-docusate  1 tablet Oral BID   umeclidinium-vilanterol  1 puff Inhalation Daily   Continuous Infusions:   LOS: 0 days    Time spent: 35 minutes    Shealyn Sean A Treavon Castilleja, MD [facility]   If 7PM-7AM, please contact night-coverage [email or web address]  10/15/2021, 10:35 AM

## 2021-10-15 NOTE — Consult Note (Addendum)
Cardiology Consultation:   Patient ID: Tamara Silva MRN: [id number]; DOB: 29-Oct-1960  Admit date: 10/14/2021 Date of Consult: 10/15/2021  PCP:  de [place], Raymond J, MD   [facility] Prov[facility] Cardiologist:  Fransico Him, MD  Electrophysiologist:  Constance Haw, MD  Advanced Heart Failure:  Loralie Champagne, MD  {   Patient Profile:   Tamara Silva is a 61 y.o. female with a hx of CAD with PCI in 2006 and 2009 and 2015,  ischemic cardiomyopathy, chronic systolic heart failure, PEA arrest 5/20, s/p Medtronic ICD 11/20, OHS/OSA, COPD, CKD III, anemia, hx of GI bleed with unremarkable scope in 2021, PE 2014, type 2 DM, CVA 1/20, HLD, HTN,  pulmonary HTN, paroxysmal A fib, Obesity, who is being seen 10/15/2021 for the evaluation of bradycardia at the request of Dr Tyrell Antonio.   History of Present Illness:   [name] has a hx of CAD (PCI in 2006 and 2009 -unclear vessel) and STEMI 2015 with DES to distal RCA.  Lasts right and left heart cath was on 02/12/19 showed 75% ostial stenosis in a moderate D1, otherwise nonobstructive disease.  No interventional target.  Mildly elevated left and right heart filling pressures with primarily pulmonary venous hypertension and preserved cardiac output. She had hx of GI bleed and anemia in the past. No source of bleeding found on EGD and colonoscopy 2021. Prasugrel was stopped. She is historically continued on ASA 1m and statin and bisoprolol. She had hx of bradycardia on high dose coreg.   She has chronic systolic heart failure, etiology was felt due to ischemic cardiomyopathy. She has hx of recurrent hospitalization for CHF exacerbation. She is currently followed by advanced heart failure team. Echo from 05/31/18 with EF 40-45%, RV moderately dilated, PASP 71 mmHg.  She had PEA arrest 10/28/18 due to hypoxemia 2/2 CHF exacerbation, was diuresed extensively with 65 pounds weight loss. Echo at the time 5/20 showed EF down to 30-35% and mildly dilated RV.  LHC not done due to AKI.  She become oxygen dependent since 11/21/18 hospital discharge. Right heart cath 02/12/19 with mildly elevated left and right heart filling pressures with primarily pulmonary venous hypertension and preserved cardiac output. Echo from  10/20 showed that EF remained 30-35% with wall motion abnormalities, RV appeared normal. Medtronic ICD placed in 11/20. She was lost to follow up since 7/21 and returned to cardiology 1/23 and was not taking metolazone for few months. Echo in 2/23 showed EF 30-35%, dyskinetic apex, severe LV dilation, normal RV size and systolic function. She ws last seen by advaced heart failure clinic 10/01/21, has SOB with exertion, on 2LNC, weight was stable, she was recommend GDMT with torsemide from 100 mg /50 qpm with KCL 236m daily, jardiance 1064maily, Bidil TID, spironolactone 77m33mily, Entresto 24/26 BID, and bisoprolol 5mg 36mly.   She presented to ER 10/14/21 with c/o multiple episodes of nausea with  emesis, poor appetite and poor PO intake, fatigue, malaise for a few days. She reports taking all of her heart failure meds daily. She is very tired now and states she hardly slept last night. She reports weight loss of 30 pounds and denied any worsening of exertional dyspnea, leg edema.  She denies any chest pain, dizziness, syncope.  She is urinating well.   Admission diagnostic showed AKI with BUN 55, Cr 2.52 and GFR 21. BNP 108. Hs trop negative x1. CBC with chronic anemia Hgb 11.5. CXR showed  pulmonary vascular congestion and patchy opacities bilaterally favored to  reflect edema. Cardiomegaly. CTAP showed no acute etiology for abdominal pain, moderate colonic stool burden. Revisualization of calcified pleural plaques with associated rounded atelectasis in the LEFT lower lobe.Query mild RIGHT femoral avascular necrosis. She was admitted for AKI, given IVF, held diuretics and Entresto. Cardiology is consulted today due to new onset of bradycardia.         Past Medical History:  Diagnosis Date   AICD (automatic cardioverter/defibrillator) present 04/04/2019   Asthma    CAD (coronary artery disease)    Chronic combined systolic (congestive) and diastolic (congestive) heart failure (HCC)    Chronic iron deficiency anemia    CVA (cerebral vascular accident) ([place]) 05/2018   Diabetes mellitus without complication ([place])    DOE (dyspnea on exertion)    Edema of both legs    Hepatic steatosis    Hypertension    Pulmonary emboli ([place]) 2014   Respiratory failure ([place]) 09/2018   Seizure ([place]) 05/2018    Past Surgical History:  Procedure Laterality Date   BIOPSY  07/10/2018   Procedure: BIOPSY;  Surgeon: [name], MD;  Location: [facility];  Service: Gastroenterology;;   COLONOSCOPY WITH PROPOFOL N/A 08/03/2013   Procedure: COLONOSCOPY WITH PROPOFOL;  Surgeon: Jeryl Columbia, MD;  Location: [facility];  Service: Endoscopy;  Laterality: N/A;   COLONOSCOPY WITH PROPOFOL N/A 07/10/2018   Procedure: COLONOSCOPY WITH PROPOFOL;  Surgeon: [name], MD;  Location: [facility];  Service: Gastroenterology;  Laterality: N/A;   CORONARY ANGIOPLASTY WITH STENT PLACEMENT     CAD in 2006 x 2 and 2009 2 more- place din REx in [place] and [place] med   CORONARY ANGIOPLASTY WITH STENT PLACEMENT  10/07/2013   Xience [place] DES 2.75  mm x 15  mm   ESOPHAGOGASTRODUODENOSCOPY (EGD) WITH PROPOFOL N/A 08/03/2013   Procedure: ESOPHAGOGASTRODUODENOSCOPY (EGD) WITH PROPOFOL;  Surgeon: Jeryl Columbia, MD;  Location: [facility];  Service: Endoscopy;  Laterality: N/A;   ESOPHAGOGASTRODUODENOSCOPY (EGD) WITH PROPOFOL N/A 07/10/2018   Procedure: ESOPHAGOGASTRODUODENOSCOPY (EGD) WITH PROPOFOL;  Surgeon: [name], MD;  Location: [facility];  Service: Gastroenterology;  Laterality: N/A;   ICD IMPLANT  04/04/2019   ICD IMPLANT N/A 04/04/2019   Procedure: ICD IMPLANT;  Surgeon: Constance Haw, MD;  Location: [facility];  Service: Cardiovascular;  Laterality:  N/A;   LEFT HEART CATHETERIZATION WITH CORONARY ANGIOGRAM N/A 10/07/2013   Procedure: LEFT HEART CATHETERIZATION WITH CORONARY ANGIOGRAM;  Surgeon: Leonie Man, MD;  Location: [facility];  Service: Cardiovascular;  Laterality: N/A;   RIGHT HEART CATH N/A 11/06/2018   Procedure: RIGHT HEART CATH;  Surgeon: Larey Dresser, MD;  Location: [facility];  Service: Cardiovascular;  Laterality: N/A;   RIGHT/LEFT HEART CATH AND CORONARY ANGIOGRAPHY N/A 02/12/2019   Procedure: RIGHT/LEFT HEART CATH AND CORONARY ANGIOGRAPHY;  Surgeon: Larey Dresser, MD;  Location: [facility];  Service: Cardiovascular;  Laterality: N/A;     Home Medications:  Prior to Admission medications   Medication Sig Start Date End Date Taking? Authorizing Provider  acetaminophen (TYLENOL) 500 MG tablet Take 1,000 mg by mouth every 6 (six) hours as needed for moderate pain or headache.   Yes [provider]  albuterol (PROVENTIL) (2.5 MG/3ML) 0.083% nebulizer solution Take 3 mLs (2.5 mg total) by nebulization every 6 (six) hours as needed for wheezing or shortness of breath. 08/27/21  Yes Olalere, Adewale A, MD  ANORO ELLIPTA 62.5-25 MCG/INH AEPB INHALE 1 PUFF INTO THE LUNGS DAILY. 02/11/21  Yes Olalere, Adewale A, MD  ascorbic acid (VITAMIN C) 500 MG tablet TAKE ONE TABLET BY MOUTH ONCE DAILY Patient taking differently: Take 500 mg by mouth daily. 04/15/20  Yes Ledell Peoples IV, MD  ASPIRIN LOW DOSE 81 MG EC tablet CHEW 1 TABLET (81 MG TOTAL) BY MOUTH DAILY. Patient taking differently: 81 mg daily. 11/27/19  Yes Ladell Pier, MD  BIDIL 20-37.5 MG tablet TAKE 1 TABLET BY MOUTH 3 (THREE) TIMES DAILY. 09/28/21  Yes Larey Dresser, MD  bisoprolol (ZEBETA) 5 MG tablet TAKE 1 TABLET (5 MG TOTAL) BY MOUTH DAILY. 04/21/21  Yes Larey Dresser, MD  D3-1000 25 MCG (1000 UT) tablet Take 1,000 Units by mouth 2 (two) times daily. 05/07/21  Yes [provider]  FEROSUL 325 (65 Fe) MG tablet TAKE 1 TABLET  (325 MG TOTAL) BY MOUTH DAILY AFTER SUPPER. PLEASE TAKE WITH A SOURCE OF VITAMIN C Patient taking differently: 325 mg every evening. 05/14/21  Yes Orson Slick, MD  fluticasone (FLONASE) 50 MCG/ACT nasal spray Place 1 spray into both nostrils daily. 09/02/21  Yes Larey Dresser, MD  hydrocerin (EUCERIN) CREA Apply 1 application topically 2 (two) times daily. Patient taking differently: Apply 1 application. topically daily. 06/22/18  Yes Love, Ivan Anchors, PA-C  insulin degludec (TRESIBA FLEXTOUCH) 200 UNIT/ML FlexTouch Pen INJECT 60 UNITS UNDER THE SKIN ONCE DAILY Patient taking differently: 65 Units daily. 09/15/21  Yes Elayne Snare, MD  ipratropium-albuterol (DUONEB) 0.5-2.5 (3) MG/3ML SOLN INHALE CONTENTS OF ONE VIAL USING NEBULIZER EVERY 6 HOURS AS NEEDED FOR SHORTNESS OF BREATH, WHEEZING Patient taking differently: 3 mLs in the morning, at noon, in the evening, and at bedtime. 07/27/21  Yes Olalere, Adewale A, MD  JARDIANCE 10 MG TABS tablet TAKE ONE TABLET BY MOUTH ONCE DAILY BEFORE BREAKFAST Patient taking differently: 10 mg daily. 09/21/21  Yes Elayne Snare, MD  omeprazole (PRILOSEC) 40 MG capsule Take 1 capsule (40 mg total) by mouth daily. 10/01/21  Yes Clegg, Amy D, NP  OZEMPIC, 2 MG/DOSE, 8 MG/3ML SOPN INJECT 2 MG AS DIRECTED ONCE A WEEK. Patient taking differently: Inject 2 mg into the muscle once a week. 08/24/21  Yes Elayne Snare, MD  potassium chloride SA (KLOR-CON M) 20 MEQ tablet Take 1 tablet (20 mEq total) by mouth daily. 09/02/21  Yes Larey Dresser, MD  RESTASIS 0.05 % ophthalmic emulsion Place 1 drop into both eyes daily as needed (dry eyes). 12/08/18  Yes [provider]  rosuvastatin (CRESTOR) 40 MG tablet TAKE 1 TABLET (40 MG TOTAL) BY MOUTH DAILY AT 6 PM. Patient taking differently: Take 40 mg by mouth daily. 08/26/21  Yes Elayne Snare, MD  sacubitril-valsartan (ENTRESTO) 24-26 MG Take 1 tablet by mouth 2 (two) times daily. 07/01/21  Yes Larey Dresser, MD  spironolactone  (ALDACTONE) 25 MG tablet Take 25 mg by mouth daily. 10/14/20  Yes [provider]  torsemide (DEMADEX) 100 MG tablet Take 1 tablet (100 mg total) by mouth in the morning AND 0.5 tablets (50 mg total) every evening. 09/02/21  Yes Larey Dresser, MD  vitamin B-12 (CYANOCOBALAMIN) 1000 MCG tablet Take 1,000 mcg by mouth daily.   Yes [provider]  ACCU-CHEK AVIVA PLUS test strip USE 1 STRIP TO CHECK BLOOD SUGAR 3 TIMES A DAY . 07/28/21   Elayne Snare, MD  Blood Glucose Monitoring Suppl (ACCU-CHEK GUIDE) w/Device KIT Use accu chek guide to check blood sugar three times daily. 10/09/19   Dwyane Dee,  Vicenta Aly, MD  Continuous Blood Gluc Sensor (FREESTYLE LIBRE 2 SENSOR) MISC USE EVERY 14 (FOURTEEN) DAYS. 07/30/21   Elayne Snare, MD  Insulin Pen Needle (EASY COMFORT PEN NEEDLES) 31G X 5 MM MISC USE 3 TIMES A DAY FOR INSULIN ADMINISTRATION 02/17/21   Elayne Snare, MD  NEEDLE, DISP, 26 G 26G X 1/2" MISC 1 application by Does not apply route daily. 06/22/18   Bary Leriche, PA-C    Inpatient Medications: Scheduled Meds:  aspirin EC  162 mg Oral Daily   enoxaparin (LOVENOX) injection  75 mg Subcutaneous Q24H   ferrous sulfate  325 mg Oral Q breakfast   hydrocerin  1 application. Topical Daily   insulin aspart  0-20 Units Subcutaneous TID WC   insulin detemir  30 Units Subcutaneous Daily   isosorbide-hydrALAZINE  1 tablet Oral TID   pantoprazole  40 mg Oral Daily   polyethylene glycol  17 g Oral BID   rosuvastatin  40 mg Oral QPC supper   senna-docusate  1 tablet Oral BID   umeclidinium-vilanterol  1 puff Inhalation Daily   Continuous Infusions:  PRN Meds: acetaminophen **OR** acetaminophen, albuterol, cycloSPORINE, ondansetron **OR** ondansetron (ZOFRAN) IV  Allergies:    Allergies  Allergen Reactions   Metolazone Other (See Comments)    Dizziness and falling Per patient in 2021, states she does not think she fell because of this medication.   Plavix [Clopidogrel Bisulfate] Other (See  Comments)    High PRU's - non-responder   Ibuprofen Other (See Comments)    Pt states she is not supposed to take ibuprofen because of other meds she is taking   Nsaids Other (See Comments)    "I do not take NSAIDs d/t interfering with other meds"    Social History:   Social History   Socioeconomic History   Marital status: Single    Spouse name: Not on file   Number of children: 1   Years of education: Not on file   Highest education level: Not on file  Occupational History   Not on file  Tobacco Use   Smoking status: Former    Packs/day: 0.50    Years: 20.00    Pack years: 10.00    Types: Cigarettes    Quit date: 05/31/2018    Years since quitting: 3.3   Smokeless tobacco: Never   Tobacco comments:    smoked off and on x 20 years  Vaping Use   Vaping Use: Never used  Substance and Sexual Activity   Alcohol use: No    Alcohol/week: 0.0 standard drinks   Drug use: Not Currently    Types: Cocaine   Sexual activity: Not Currently  Other Topics Concern   Not on file  Social History Narrative   Origibnally from [place]   Most recently from [place]   Daughter lives in town      On Social security to CHF, COPD, CAD   Social Determinants of Health   Financial Resource Strain: Not on file  Food Insecurity: Not on file  Transportation Needs: Not on file  Physical Activity: Not on file  Stress: Not on file  Social Connections: Not on file  Intimate Partner Violence: Not on file    Family History:    Family History  Problem Relation Age of Onset   Stroke Mother    Hypertension Mother    Colon cancer Father    Diabetes Father    Heart attack Father    Sarcoidosis Other  Breast cancer Paternal Aunt    Emphysema Brother    Lung disease Brother        Unknown type, 3 brothers, one with liver and lung disease   Diabetes Brother    Asthma Paternal Aunt    Diabetes Sister      ROS:  Constitutional: Denied fever, chills, malaise, night sweats Eyes: Denied  vision change or loss Ears/Nose/Mouth/Throat: Denied ear ache, sore throat, coughing, sinus pain Cardiovascular: See HPI Respiratory: denied shortness of breath Gastrointestinal: See HPI Genital/Urinary: Denied dysuria, hematuria, urinary frequency/urgency Musculoskeletal: Denied muscle ache, joint pain, weakness Skin: Denied rash, wound Neuro: Denied headache, dizziness, syncope Psych: Denied history of depression/anxiety  Endocrine: history of diabetes     Physical Exam/Data:   Vitals:   10/15/21 0206 10/15/21 0311 10/15/21 0611 10/15/21 1205  BP: (!) 117/57  103/67 113/63  Pulse: (!) 52 (!) 52 (!) 50 (!) 45  Resp: 20 (!) 23 20 18   Temp: 97.7 F (36.5 C)  98.7 F (37.1 C) 98.4 F (36.9 C)  TempSrc:    Oral  SpO2: 99% 99% 100% 98%    Intake/Output Summary (Last 24 hours) at 10/15/2021 1207 Last data filed at 10/15/2021 0400 Gross per 24 hour  Intake 1693.53 ml  Output --  Net 1693.53 ml      10/13/2021    8:24 PM 10/01/2021    9:17 AM 09/02/2021   10:56 AM  Last 3 Weights  Weight (lbs) 333 lb 8.9 oz 333 lb 9.6 oz 334 lb  Weight (kg) 151.3 kg 151.32 kg 151.501 kg     There is no height or weight on file to calculate BMI.  Vitals:  Vitals:   10/15/21 1211 10/15/21 1219  BP:    Pulse:  (!) 48  Resp:  (!) 26  Temp:    SpO2: 99% 99%   General Appearance: In no apparent distress, laying in bed, morbidly obese HEENT: Normocephalic, atraumatic. Neck: Supple, trachea midline, no JVD Cardiovascular: Regular rate and rhythm, normal S1-S2,  no murmur/rub/gallop Respiratory: Resting breathing unlabored, lungs sounds clear to auscultation bilaterally, no use of accessory muscles. On room air.  No wheezes, rales or rhonchi.   Gastrointestinal: Bowel sounds positive, abdomen soft Extremities: Able to move all extremities in bed without difficulty, no edema Musculoskeletal: Normal muscle bulk and tone Skin: Intact, warm, dry. No rashes or petechiae noted in exposed areas.   Neurologic: Alert, oriented to person, place and time. Fluent speech, no facial droop, no cognitive deficit Psychiatric: Normal affect. Mood is appropriate.     EKG:  The EKG was personally reviewed and demonstrates: EKG from 10/15/2021 at 1018 revealed junctional rhythm 46 bpm  Telemetry:  Telemetry was personally reviewed and demonstrates: Junctional bradycardia mid 40s   Relevant CV Studies:  Echocardiogram from 07/28/2021:  1. No left ventricular thrombus (with Definity contrast). Dyskinetic left  ventricular apex and overall wall motion consistent with an extensive old  infarct in the LAD artery distribution. Left ventricular ejection  fraction, by estimation, is 30 to 35%.  The left ventricle has moderately decreased function. The left ventricle  demonstrates regional wall motion abnormalities (see scoring  diagram/findings for description). The left ventricular internal cavity  size was severely dilated. Left ventricular  diastolic parameters are consistent with Grade II diastolic dysfunction  (pseudonormalization). Elevated left atrial pressure.   2. Right ventricular systolic function is normal. The right ventricular  size is normal.   3. Left atrial size was mildly dilated.   4.  The mitral valve is normal in structure. No evidence of mitral valve  regurgitation.   5. The aortic valve is tricuspid. Aortic valve regurgitation is not  visualized. No aortic stenosis is present.   6. The inferior vena cava is normal in size with greater than 50%  respiratory variability, suggesting right atrial pressure of 3 mmHg.   Comparison(s): Prior images reviewed side by side. The left ventricular  function is unchanged. The left ventricular diastolic function is  significantly worse. There is now evidence of elevated mean left atrial  pressure.   Right and left heart catheterization from 02/12/2019:  1. Mildly elevated left and right heart filling pressures with primarily pulmonary  venous hypertension and preserved cardiac output.  2. 75% ostial stenosis in a moderate D1, otherwise nonobstructive disease.  No interventional target.     I will increase torsemide to 60 mg bid with elevated filling pressures.    Laboratory Data:  High Sensitivity Troponin:   Recent Labs  Lab 10/14/21 0901  TROPONINIHS 13     Chemistry Recent Labs  Lab 10/13/21 2031 10/14/21 0901 10/15/21 0647 10/15/21 1000  NA 138 139 136  --   K 5.2* 4.5 4.2  --   CL 101 103 108  --   CO2 27 26 22   --   GLUCOSE 106* 114* 113*  --   BUN 39* 55* 54*  --   CREATININE 1.73* 2.52* 2.32*  --   CALCIUM 9.5 8.6* 7.7*  --   MG  --   --   --  1.8  GFRNONAA 33* 21* 23*  --   ANIONGAP 10 10 6   --     Recent Labs  Lab 10/13/21 2031 10/14/21 0901  PROT 8.7* 8.0  ALBUMIN 3.9 3.6  AST 16 14*  ALT 12 13  ALKPHOS 59 52  BILITOT 0.4 0.5   Lipids No results for input(s): CHOL, TRIG, HDL, LABVLDL, LDLCALC, CHOLHDL in the last 168 hours.  Hematology Recent Labs  Lab 10/13/21 2031 10/14/21 0901 10/15/21 0647  WBC 7.0 6.2 4.4  RBC 4.81 4.59 4.14  HGB 11.6* 11.5* 10.4*  HCT 39.0 37.9 34.8*  MCV 81.1 82.6 84.1  MCH 24.1* 25.1* 25.1*  MCHC 29.7* 30.3 29.9*  RDW 18.5* 18.4* 18.6*  PLT 298 256 221   Thyroid No results for input(s): TSH, FREET4 in the last 168 hours.  BNP Recent Labs  Lab 10/14/21 0901  BNP 108.6*    DDimer No results for input(s): DDIMER in the last 168 hours.   Radiology/Studies:  CT ABDOMEN PELVIS WO CONTRAST  Result Date: 10/14/2021 CLINICAL DATA:  Abdominal pain, acute, nonlocalized EXAM: CT ABDOMEN AND PELVIS WITHOUT CONTRAST TECHNIQUE: Multidetector CT imaging of the abdomen and pelvis was performed following the standard protocol without IV contrast. RADIATION DOSE REDUCTION: This exam was performed according to the departmental dose-optimization program which includes automated exposure control, adjustment of the mA and/or kV according to patient size and/or  use of iterative reconstruction technique. COMPARISON:  CT dated May 29, 2018; February 01, 2014. FINDINGS: Evaluation is limited by lack of IV contrast. Lower chest: Revisualization calcified pleural plaques in the LEFT lower lobe. There is revisualization of several areas of bandlike opacities of bilateral bases most consistent with scarring/rounded atelectasis, mildly increased since 2015. Irregular opacity superior to the rounded atelectasis appears increased in comparison to prior and spans approximately 25 mm (series 2, image 1). Patchy centrilobular nodularity of the RIGHT lower lobe is similar comparison to prior.  Paraseptal emphysema. Cardiomegaly. Hepatobiliary: Hepatic steatosis. Gallbladder is unremarkable. Coarse calcification along the hepatic dome, unchanged. Pancreas: No peripancreatic fat stranding. Spleen: Unremarkable. Adrenals/Urinary Tract: Adrenal glands are unremarkable. No hydronephrosis. No obstructing nephrolithiasis bladder is decompressed. Stomach/Bowel: No evidence of bowel obstruction. Moderate colonic stool burden diffusely throughout the colon. Appendix is normal. Stomach is decompressed. Likely tiny hiatal hernia. Vascular/Lymphatic: Abdominal aorta is normal in course and caliber with scattered atherosclerotic calcifications. Revisualization of multiple prominent subcentimeter retroperitoneal lymph nodes. Representative aortocaval lymph node measures 9 mm in the short axis, unchanged in comparison to prior (series 2, image 51). Reproductive: Uterus and bilateral adnexa are unremarkable. Other: No free air or free fluid. Musculoskeletal: Faint serpiginous sclerosis of the RIGHT femoral head may reflect underlying avascular necrosis. No evidence of femoral head flattening. IMPRESSION: 1. No suspicious CT etiology for acute abdominal pain is identified. Moderate colonic stool burden. 2. Revisualization of calcified pleural plaques with associated rounded atelectasis in the LEFT  lower lobe. One area of 25 mm irregular consolidation at the edge of the CT field of view appears increased in comparison to prior. This is nonspecific and could reflect progression of scarring versus superimposed infection. Recommend follow-up CT chest in 3 months to assess for evolution. 3. Query mild RIGHT femoral avascular necrosis. This could be further assessed with dedicated nonemergent MRI if clinically indicated. Aortic Atherosclerosis (ICD10-I70.0) and Emphysema (ICD10-J43.9). Electronically Signed   By: Valentino Saxon M.D.   On: 10/14/2021 13:28   DG Chest 1 View  Result Date: 10/14/2021 CLINICAL DATA:  Shortness of breath EXAM: CHEST  1 VIEW COMPARISON:  04/25/2021 FINDINGS: Pulmonary vascular congestion and patchy opacities bilaterally. No significant pleural effusion. Cardiomegaly. Left chest wall ICD. IMPRESSION: Pulmonary vascular congestion and patchy opacities bilaterally favored to reflect edema. Cardiomegaly. Electronically Signed   By: Macy Mis M.D.   On: 10/14/2021 09:54   DG Hip Unilat W or Wo Pelvis 2-3 Views Left  Result Date: 10/14/2021 CLINICAL DATA:  Left hip pain EXAM: DG HIP (WITH OR WITHOUT PELVIS) 2-3V LEFT COMPARISON:  None Available. FINDINGS: Evaluation is limited due to overlying soft tissues which significantly degrades image quality. No displaced fracture identified. Left hip joint space normal. Much of the pelvis obscured due to overlying soft tissues and inadequate technique. IMPRESSION: Limited study due to patient size. No displaced fracture left hip. Consider CT if symptoms continue. Electronically Signed   By: Franchot Gallo M.D.   On: 10/14/2021 10:06     Assessment and Plan:   Junctional bradycardia, asymptomatic -Stop home medication bisoprolol -Continue telemetry monitor -Treat underlying cause of nausea with emesis  Acute kidney injury -Likely prerenal from nausea with vomiting and poor oral intake -On IV fluids, agree with holding  Entresto and diuretics -Trend renal index  Chronic systolic heart failure Ischemic cardiomyopathy -GDMT include spironolactone, BiDil, Jardiance, Entresto, bisoprolol -Holding medication as above -No clinical evidence of decompensation at this time  CAD -No chest pain, high sensitive troponin negative x1 -Continue medical therapy with aspirin and Crestor  COPD OSA Type 2 diabetes Chronic anemia Hypertension Hyperlipidemia -Managed per medicine service   Risk Assessment/Risk Scores:     For questions or updates, please contact [facility] Please consult [email or web address] for contact info under    Signed, Margie Billet, NP  10/15/2021 12:07 PM  Patient seen and examined and agree with Margie Billet, NP as detailed above.   In brief, the patient is a 61 y.o. female with a hx of CAD with PCI in  2006 and 2009 and 2015,  ischemic cardiomyopathy, chronic systolic heart failure, PEA arrest 5/20, s/p Medtronic ICD 11/20, OHS/OSA, COPD, CKD III, anemia, hx of GI bleed with unremarkable scope in 2021, PE 2014, type 2 DM, CVA 1/20, HLD, HTN,  pulmonary HTN, paroxysmal A fib, Obesity who presented to the ER with nausea, vomiting, poor PO intake and generalized malaise. Course complicated by junctional bradycardia for which Cardiology has been consulted.   Patient is followed by heart failure team as outpatient. Has history of chronic systolic heart failure thought to be ischemic in nature with history of RCA STEMI in the past. Last TTE 07/2021 with EF 30-35%, dyskinetic apex, severe LV dilation, normal RV size and systolic function. She is on chronic O2 at home. Notably, has history of bradycardia with coreg in the past which was stopped. She has been on bisoprolol. The patient reports that she has been compliant with all her medications.   She presented on this admission with nausea, vomiting and poor PO intake. She states that she was visiting family out of town and was trying to avoid drinking  water to prevent her from going to the bathroom. She was continuing to take all her heart failure medications including her torsemide. On admission here, she had an AKI with Cr 2.5 that improved to 2.3 today after IVF. Review of telemetry reveals she is in a junctional rhythm with HR 40s while on her bisoprolol. She is otherwise HD stable and asymptomatic from a cardiac perspective.    GEN: No acute distress. On chronic O2  Neck: JVD difficult to assess due to body habitus Cardiac: Bradycardic, regular, no murmurs Respiratory: Diminshed but clear GI: Obese, soft, ND, NTTP  MS: No edema; No deformity. Warm Neuro:  Nonfocal  Psych: Normal affect     Plan: -Stop bisoprolol  -Holding GDMT due to AKI, resume once able -Does not appear acutely decompensated from HF standpoint  -No evidence of ACS and trop negative -Has history of bradycardia with BB in the past -Continue management of nausea and AKI per primary   Gwyndolyn Kaufman, MD

## 2021-10-15 NOTE — Progress Notes (Signed)
Patient call light on.  Entered room to assist patient and adjusted temperature as requested.  Attempted to auscultate patient's lung sounds and assess but patient refused stating "I don't want you to do that right now".  Will attempt to assess at a later time as patient is not agreeable at this time.

## 2021-10-16 DIAGNOSIS — R001 Bradycardia, unspecified: Secondary | ICD-10-CM

## 2021-10-16 DIAGNOSIS — I5042 Chronic combined systolic (congestive) and diastolic (congestive) heart failure: Secondary | ICD-10-CM | POA: Diagnosis not present

## 2021-10-16 DIAGNOSIS — I498 Other specified cardiac arrhythmias: Secondary | ICD-10-CM | POA: Diagnosis not present

## 2021-10-16 DIAGNOSIS — R112 Nausea with vomiting, unspecified: Secondary | ICD-10-CM

## 2021-10-16 DIAGNOSIS — N179 Acute kidney failure, unspecified: Secondary | ICD-10-CM | POA: Diagnosis not present

## 2021-10-16 LAB — BASIC METABOLIC PANEL
Anion gap: 7 (ref 5–15)
BUN: 62 mg/dL — ABNORMAL HIGH (ref 8–23)
CO2: 24 mmol/L (ref 22–32)
Calcium: 8 mg/dL — ABNORMAL LOW (ref 8.9–10.3)
Chloride: 106 mmol/L (ref 98–111)
Creatinine, Ser: 2.59 mg/dL — ABNORMAL HIGH (ref 0.44–1.00)
GFR, Estimated: 20 mL/min — ABNORMAL LOW (ref >60)
Glucose, Bld: 99 mg/dL (ref 70–99)
Potassium: 4.4 mmol/L (ref 3.5–5.1)
Sodium: 137 mmol/L (ref 135–145)

## 2021-10-16 LAB — GLUCOSE, CAPILLARY
Glucose-Capillary: 101 mg/dL — ABNORMAL HIGH (ref 70–99)
Glucose-Capillary: 136 mg/dL — ABNORMAL HIGH (ref 70–99)
Glucose-Capillary: 146 mg/dL — ABNORMAL HIGH (ref 70–99)
Glucose-Capillary: 107 mg/dL — ABNORMAL HIGH (ref 70–99)

## 2021-10-16 MED ORDER — SALINE SPRAY 0.65 % NA SOLN: 1.0000 | NASAL | Status: DC | PRN

## 2021-10-16 MED ORDER — SORBITOL 70 % SOLN
30.0000 mL | Freq: Once | Status: AC
  Administered 2021-10-16: 30 mL via ORAL
  Filled 2021-10-16: qty 30

## 2021-10-16 MED ORDER — LACTULOSE 10 GM/15ML PO SOLN
30.0000 g | Freq: Two times a day (BID) | ORAL | Status: DC | PRN
  Administered 2021-10-16: 30 g via ORAL
  Filled 2021-10-16: qty 60

## 2021-10-16 NOTE — Progress Notes (Addendum)
PROGRESS NOTE    Tamara Silva  WUJ:[id number] DOB: 1961-05-03 DOA: 10/14/2021 PCP: de [place], Raymond J, MD   Brief Narrative: 61 year old with past medical history significant for AICD, asthma, CAD, chronic combined systolic and diastolic heart failure, chronic iron deficiency anemia, history of CVA, type 2 diabetes, lymphedema, hepatic asteatosis, hypertension, history of PE history of respiratory failure, seizure disorder, class III obesity who presents complaining of multiple episode of nausea, decreased appetite, fatigue and malaise for the past few days.  Reports poor oral intake.  She uses Ozempic last Saturday.  She has been on Ozempic for 4 months now.  She was found to have AKI on CKD.  Admitted for hydration.  She was also found to be bradycardic junctional rhythm cardiology consulted.   Assessment & Plan:   Principal Problem:   AKI (acute kidney injury) ([place]) Active Problems:   Essential hypertension   Chronic combined systolic and diastolic heart failure ([place])   COPD GOLD III    Dyslipidemia   Diabetes mellitus type 2 in obese (HCC)   Chronic respiratory failure with hypoxia and hypercapnia (HCC)   Hepatic steatosis   BiPAP (biphasic positive airway pressure) dependence   Class 3 obesity (HCC)   Sinus bradycardia   Nausea & vomiting  1-AKI on CKD stage IIb In the setting poor  oral intake and vomiting.  -Patient creatinine baseline 1.9---2.1 -She presented with a creatinine of 2.5 -she received IV hydration. Stop IV fluids.  Monitor renal function.  Cr back to 2.5 , plan to hold diuretics and monitor.   2-Abdominal pain nausea and vomiting: Could be related to constipation, gastroenteritis versus Ozempic use.  Although she has been on Ozempic for 4 months now Denies further episode of vomiting.  Bowel regimen order.  Tolerating diet.  Continue to work on bowel regimen. Will order sorbitol, PRN lactulose.   3-COPD Gold 3, chronic respiratory failure with  hypoxia and hypercapnia Continue with supplemental oxygen Continue Anoro Ellipta Continue bronchodilators  Hypertension: Hold Entresto due to AKI.  Hold bisoprolol due to bradycardia  Chronic combined systolic and diastolic heart failure: Compensated Plan to hold medications  Dyslipidemia: Continue with rosuvastatin  Bradycardia, junctional rhythm Cardiology consulted Continue to hold bisoprolol.       Estimated body mass index is 51.47 kg/m as calculated from the following:   Height as of 10/13/21: 5' 7.5" (1.715 m).   Weight as of 10/13/21: 151.3 kg.   DVT prophylaxis: Lovenox Code Status: Full code Family Communication: Care discussed with patient Disposition Plan:  Status is: Observation The patient remains OBS appropriate and will d/c before 2 midnights.    Consultants:  Cardiology   Procedures:    Antimicrobials:    Subjective: She is tolerating diet. She will feel better after she is able to move her Bowel.  Denies dyspnea.   Objective: Vitals:   10/16/21 0650 10/16/21 0715 10/16/21 0840 10/16/21 1336  BP:  112/64  (!) 122/49  Pulse:  (!) 44  62  Resp:  20  (!) 24  Temp:  97.9 F (36.6 C)  97.9 F (36.6 C)  TempSrc:  Axillary  Oral  SpO2: 99% 99% 99% 100%    Intake/Output Summary (Last 24 hours) at 10/16/2021 1339 Last data filed at 10/16/2021 1337 Gross per 24 hour  Intake 960 ml  Output 1450 ml  Net -490 ml   There were no vitals filed for this visit.  Examination:  General exam: NAD Respiratory system: CTA Cardiovascular system: S 1,  S 2 RRR Gastrointestinal system: BS present, soft, nt Central nervous system: Alert Extremities: No edema   Data Reviewed: I have personally reviewed following labs and imaging studies  CBC: Recent Labs  Lab 10/13/21 2031 10/14/21 0901 10/15/21 0647  WBC 7.0 6.2 4.4  NEUTROABS  --  5.2  --   HGB 11.6* 11.5* 10.4*  HCT 39.0 37.9 34.8*  MCV 81.1 82.6 84.1  PLT 298 256 403   Basic  Metabolic Panel: Recent Labs  Lab 10/13/21 2031 10/14/21 0901 10/15/21 0647 10/15/21 1000 10/16/21 0425  NA 138 139 136  --  137  K 5.2* 4.5 4.2  --  4.4  CL 101 103 108  --  106  CO2 '27 26 22  '$ --  24  GLUCOSE 106* 114* 113*  --  99  BUN 39* 55* 54*  --  62*  CREATININE 1.73* 2.52* 2.32*  --  2.59*  CALCIUM 9.5 8.6* 7.7*  --  8.0*  MG  --   --   --  1.8  --    GFR: Estimated Creatinine Clearance: 35.4 mL/min (A) (by C-G formula based on SCr of 2.59 mg/dL (H)). Liver Function Tests: Recent Labs  Lab 10/13/21 2031 10/14/21 0901  AST 16 14*  ALT 12 13  ALKPHOS 59 52  BILITOT 0.4 0.5  PROT 8.7* 8.0  ALBUMIN 3.9 3.6   Recent Labs  Lab 10/13/21 2031 10/14/21 0901  LIPASE 43 27   No results for input(s): AMMONIA in the last 168 hours. Coagulation Profile: No results for input(s): INR, PROTIME in the last 168 hours. Cardiac Enzymes: No results for input(s): CKTOTAL, CKMB, CKMBINDEX, TROPONINI in the last 168 hours. BNP (last 3 results) No results for input(s): PROBNP in the last 8760 hours. HbA1C: No results for input(s): HGBA1C in the last 72 hours. CBG: Recent Labs  Lab 10/15/21 1130 10/15/21 1639 10/15/21 2236 10/16/21 0725 10/16/21 1136  GLUCAP 113* 109* 103* 101* 136*   Lipid Profile: No results for input(s): CHOL, HDL, LDLCALC, TRIG, CHOLHDL, LDLDIRECT in the last 72 hours. Thyroid Function Tests: No results for input(s): TSH, T4TOTAL, FREET4, T3FREE, THYROIDAB in the last 72 hours. Anemia Panel: No results for input(s): VITAMINB12, FOLATE, FERRITIN, TIBC, IRON, RETICCTPCT in the last 72 hours. Sepsis Labs: No results for input(s): PROCALCITON, LATICACIDVEN in the last 168 hours.  Recent Results (from the past 240 hour(s))  Resp Panel by RT-PCR (Flu A&B, Covid) Nasopharyngeal Swab     Status: None   Collection Time: 10/14/21  9:01 AM   Specimen: Nasopharyngeal Swab; Nasopharyngeal(NP) swabs in vial transport medium  Result Value Ref Range Status    SARS Coronavirus 2 by RT PCR NEGATIVE NEGATIVE Final    Comment: (NOTE) SARS-CoV-2 target nucleic acids are NOT DETECTED.  The SARS-CoV-2 RNA is generally detectable in upper respiratory specimens during the acute phase of infection. The lowest concentration of SARS-CoV-2 viral copies this assay can detect is 138 copies/mL. A negative result does not preclude SARS-Cov-2 infection and should not be used as the sole basis for treatment or other patient management decisions. A negative result may occur with  improper specimen collection/handling, submission of specimen other than nasopharyngeal swab, presence of viral mutation(s) within the areas targeted by this assay, and inadequate number of viral copies(<138 copies/mL). A negative result must be combined with clinical observations, patient history, and epidemiological information. The expected result is Negative.  Fact Sheet for Patients:  EntrepreneurPulse.com.au  Fact Sheet for Healthcare Providers:  IncredibleEmployment.be  This test is no t yet approved or cleared by the [place] and  has been authorized for detection and/or diagnosis of SARS-CoV-2 by FDA under an Emergency Use Authorization (EUA). This EUA will remain  in effect (meaning this test can be used) for the duration of the COVID-19 declaration under Section 564(b)(1) of the Act, 21 U.S.C.section 360bbb-3(b)(1), unless the authorization is terminated  or revoked sooner.       Influenza A by PCR NEGATIVE NEGATIVE Final   Influenza B by PCR NEGATIVE NEGATIVE Final    Comment: (NOTE) The Xpert Xpress SARS-CoV-2/FLU/RSV plus assay is intended as an aid in the diagnosis of influenza from Nasopharyngeal swab specimens and should not be used as a sole basis for treatment. Nasal washings and aspirates are unacceptable for Xpert Xpress SARS-CoV-2/FLU/RSV testing.  Fact Sheet for  Patients: EntrepreneurPulse.com.au  Fact Sheet for Healthcare Providers: IncredibleEmployment.be  This test is not yet approved or cleared by the [place] FDA and has been authorized for detection and/or diagnosis of SARS-CoV-2 by FDA under an Emergency Use Authorization (EUA). This EUA will remain in effect (meaning this test can be used) for the duration of the COVID-19 declaration under Section 564(b)(1) of the Act, 21 U.S.C. section 360bbb-3(b)(1), unless the authorization is terminated or revoked.  Performed at [facility], [place] [address], [place], [place] [address]          Radiology Studies: No results found.      Scheduled Meds:  aspirin EC  162 mg Oral Daily   enoxaparin (LOVENOX) injection  75 mg Subcutaneous Q24H   ferrous sulfate  325 mg Oral Q breakfast   hydrocerin  1 application. Topical Daily   insulin aspart  0-20 Units Subcutaneous TID WC   insulin detemir  30 Units Subcutaneous Daily   isosorbide-hydrALAZINE  1 tablet Oral TID   pantoprazole  40 mg Oral Daily   polyethylene glycol  17 g Oral BID   rosuvastatin  40 mg Oral QPC supper   senna-docusate  1 tablet Oral BID   sorbitol  30 mL Oral Once   umeclidinium-vilanterol  1 puff Inhalation Daily   Continuous Infusions:   LOS: 0 days    Time spent: 35 minutes    Tristram Milian A Leniyah Martell, MD [facility]   If 7PM-7AM, please contact night-coverage [email or web address]  10/16/2021, 1:39 PM

## 2021-10-16 NOTE — Progress Notes (Addendum)
Progress Note  Patient Name: Tamara Silva Date of Encounter: 10/16/2021  [facility] Cardiologist: Fransico Him, MD   Subjective   Complains of constipation. No chest pain. Breathing at baseline.  Inpatient Medications    Scheduled Meds:  aspirin EC  162 mg Oral Daily   enoxaparin (LOVENOX) injection  75 mg Subcutaneous Q24H   ferrous sulfate  325 mg Oral Q breakfast   hydrocerin  1 application. Topical Daily   insulin aspart  0-20 Units Subcutaneous TID WC   insulin detemir  30 Units Subcutaneous Daily   isosorbide-hydrALAZINE  1 tablet Oral TID   pantoprazole  40 mg Oral Daily   polyethylene glycol  17 g Oral BID   rosuvastatin  40 mg Oral QPC supper   senna-docusate  1 tablet Oral BID   umeclidinium-vilanterol  1 puff Inhalation Daily   Continuous Infusions:  PRN Meds: acetaminophen **OR** acetaminophen, albuterol, cycloSPORINE, ondansetron **OR** ondansetron (ZOFRAN) IV, sodium chloride   Vital Signs    Vitals:   10/16/21 0411 10/16/21 0650 10/16/21 0715 10/16/21 0840  BP:   112/64   Pulse: (!) 42  (!) 44   Resp: (!) 22  20   Temp:   97.9 F (36.6 C)   TempSrc:   Axillary   SpO2: 99% 99% 99% 99%    Intake/Output Summary (Last 24 hours) at 10/16/2021 1003 Last data filed at 10/16/2021 0857 Gross per 24 hour  Intake 720 ml  Output 1300 ml  Net -580 ml      10/13/2021    8:24 PM 10/01/2021    9:17 AM 09/02/2021   10:56 AM  Last 3 Weights  Weight (lbs) 333 lb 8.9 oz 333 lb 9.6 oz 334 lb  Weight (kg) 151.3 kg 151.32 kg 151.501 kg      Telemetry    Sinus rhythm, occasional junctional rhythm, PVCs- Personally Reviewed  ECG    No new - Personally Reviewed  Physical Exam   GEN: Sitting up, on chronic O2, comfortable Neck: JVD difficult to assess due to body habitus Cardiac: Bradycardic, regular Respiratory: Clear to auscultation bilaterally. GI: Soft, nontender, non-distended  MS: No edema; No deformity. Neuro:  Nonfocal  Psych: Normal affect    Labs    High Sensitivity Troponin:   Recent Labs  Lab 10/14/21 0901  TROPONINIHS 13     Chemistry Recent Labs  Lab 10/13/21 2031 10/14/21 0901 10/15/21 0647 10/15/21 1000 10/16/21 0425  NA 138 139 136  --  137  K 5.2* 4.5 4.2  --  4.4  CL 101 103 108  --  106  CO2 '27 26 22  '$ --  24  GLUCOSE 106* 114* 113*  --  99  BUN 39* 55* 54*  --  62*  CREATININE 1.73* 2.52* 2.32*  --  2.59*  CALCIUM 9.5 8.6* 7.7*  --  8.0*  MG  --   --   --  1.8  --   PROT 8.7* 8.0  --   --   --   ALBUMIN 3.9 3.6  --   --   --   AST 16 14*  --   --   --   ALT 12 13  --   --   --   ALKPHOS 59 52  --   --   --   BILITOT 0.4 0.5  --   --   --   GFRNONAA 33* 21* 23*  --  20*  ANIONGAP '10 10 6  '$ --  7    Lipids No results for input(s): CHOL, TRIG, HDL, LABVLDL, LDLCALC, CHOLHDL in the last 168 hours.  Hematology Recent Labs  Lab 10/13/21 2031 10/14/21 0901 10/15/21 0647  WBC 7.0 6.2 4.4  RBC 4.81 4.59 4.14  HGB 11.6* 11.5* 10.4*  HCT 39.0 37.9 34.8*  MCV 81.1 82.6 84.1  MCH 24.1* 25.1* 25.1*  MCHC 29.7* 30.3 29.9*  RDW 18.5* 18.4* 18.6*  PLT 298 256 221   Thyroid No results for input(s): TSH, FREET4 in the last 168 hours.  BNP Recent Labs  Lab 10/14/21 0901  BNP 108.6*    DDimer No results for input(s): DDIMER in the last 168 hours.   Radiology    CT ABDOMEN PELVIS WO CONTRAST  Result Date: 10/14/2021 CLINICAL DATA:  Abdominal pain, acute, nonlocalized EXAM: CT ABDOMEN AND PELVIS WITHOUT CONTRAST TECHNIQUE: Multidetector CT imaging of the abdomen and pelvis was performed following the standard protocol without IV contrast. RADIATION DOSE REDUCTION: This exam was performed according to the departmental dose-optimization program which includes automated exposure control, adjustment of the mA and/or kV according to patient size and/or use of iterative reconstruction technique. COMPARISON:  CT dated May 29, 2018; February 01, 2014. FINDINGS: Evaluation is limited by lack of IV  contrast. Lower chest: Revisualization calcified pleural plaques in the LEFT lower lobe. There is revisualization of several areas of bandlike opacities of bilateral bases most consistent with scarring/rounded atelectasis, mildly increased since 2015. Irregular opacity superior to the rounded atelectasis appears increased in comparison to prior and spans approximately 25 mm (series 2, image 1). Patchy centrilobular nodularity of the RIGHT lower lobe is similar comparison to prior. Paraseptal emphysema. Cardiomegaly. Hepatobiliary: Hepatic steatosis. Gallbladder is unremarkable. Coarse calcification along the hepatic dome, unchanged. Pancreas: No peripancreatic fat stranding. Spleen: Unremarkable. Adrenals/Urinary Tract: Adrenal glands are unremarkable. No hydronephrosis. No obstructing nephrolithiasis bladder is decompressed. Stomach/Bowel: No evidence of bowel obstruction. Moderate colonic stool burden diffusely throughout the colon. Appendix is normal. Stomach is decompressed. Likely tiny hiatal hernia. Vascular/Lymphatic: Abdominal aorta is normal in course and caliber with scattered atherosclerotic calcifications. Revisualization of multiple prominent subcentimeter retroperitoneal lymph nodes. Representative aortocaval lymph node measures 9 mm in the short axis, unchanged in comparison to prior (series 2, image 51). Reproductive: Uterus and bilateral adnexa are unremarkable. Other: No free air or free fluid. Musculoskeletal: Faint serpiginous sclerosis of the RIGHT femoral head may reflect underlying avascular necrosis. No evidence of femoral head flattening. IMPRESSION: 1. No suspicious CT etiology for acute abdominal pain is identified. Moderate colonic stool burden. 2. Revisualization of calcified pleural plaques with associated rounded atelectasis in the LEFT lower lobe. One area of 25 mm irregular consolidation at the edge of the CT field of view appears increased in comparison to prior. This is nonspecific  and could reflect progression of scarring versus superimposed infection. Recommend follow-up CT chest in 3 months to assess for evolution. 3. Query mild RIGHT femoral avascular necrosis. This could be further assessed with dedicated nonemergent MRI if clinically indicated. Aortic Atherosclerosis (ICD10-I70.0) and Emphysema (ICD10-J43.9). Electronically Signed   By: Valentino Saxon M.D.   On: 10/14/2021 13:28    Cardiac Studies   Echocardiogram from 07/28/2021:   1. No left ventricular thrombus (with Definity contrast). Dyskinetic left  ventricular apex and overall wall motion consistent with an extensive old  infarct in the LAD artery distribution. Left ventricular ejection  fraction, by estimation, is 30 to 35%.  The left ventricle has moderately decreased function. The left ventricle  demonstrates regional wall motion abnormalities (see scoring  diagram/findings for description). The left ventricular internal cavity  size was severely dilated. Left ventricular  diastolic parameters are consistent with Grade II diastolic dysfunction  (pseudonormalization). Elevated left atrial pressure.   2. Right ventricular systolic function is normal. The right ventricular  size is normal.   3. Left atrial size was mildly dilated.   4. The mitral valve is normal in structure. No evidence of mitral valve  regurgitation.   5. The aortic valve is tricuspid. Aortic valve regurgitation is not  visualized. No aortic stenosis is present.   6. The inferior vena cava is normal in size with greater than 50%  respiratory variability, suggesting right atrial pressure of 3 mmHg.   Comparison(s): Prior images reviewed side by side. The left ventricular  function is unchanged. The left ventricular diastolic function is  significantly worse. There is now evidence of elevated mean left atrial  pressure.    Right and left heart catheterization from 02/12/2019:   1. Mildly elevated left and right heart filling  pressures with primarily pulmonary venous hypertension and preserved cardiac output.  2. 75% ostial stenosis in a moderate D1, otherwise nonobstructive disease.  No interventional target.     I will increase torsemide to 60 mg bid with elevated filling pressures.     Patient Profile     61 y.o. female  hx of CAD with PCI in 2006 and 2009 and 2015,  ischemic cardiomyopathy, chronic systolic heart failure, PEA arrest 5/20, s/p Medtronic ICD 11/20, OHS/OSA, COPD, CKD III, anemia, hx of GI bleed with unremarkable scope in 2021, PE 2014, type 2 DM, CVA 1/20, HLD, HTN,  pulmonary HTN, paroxysmal A fib, Obesity, now being seen for junct rhythm after admit for AKI in setting of poor oral intake and vomiting.    Assessment & Plan    Junctional bradycardia, asymptomatic -Stop home medication bisoprolol  hx of bradycardia with BB in the past. -Continue telemetry monitor -Treat underlying cause of nausea with emesis   Acute kidney injury -Likely prerenal from nausea with vomiting and poor oral intake -On IV fluids, agree with holding Entresto and diuretics -Trend renal index  Cr 2.59 today up from yesterday   Chronic systolic heart failure Ischemic cardiomyopathy -GDMT include spironolactone, BiDil, Jardiance, Entresto, bisoprolol  on hold until AKI improves -No clinical evidence of decompensation at this time   CAD -No chest pain, high sensitive troponin negative x1 -Continue medical therapy with aspirin and Crestor   COPD OSA Type 2 diabetes Chronic anemia Hypertension Hyperlipidemia -Managed per medicine service     For questions or updates, please contact [facility] Please consult [email or web address] for contact info under        Signed, Cecilie Kicks, NP  10/16/2021, 10:03 AM    Patient seen and examined and agree with Cecilie Kicks, NP as detailed above.   In brief, the patient is a 61 y.o. female with a hx of CAD with PCI in 2006 and 2009 and 2015,  ischemic cardiomyopathy,  chronic systolic heart failure, PEA arrest 5/20, s/p Medtronic ICD 11/20, OHS/OSA, COPD, CKD III, anemia, hx of GI bleed with unremarkable scope in 2021, PE 2014, type 2 DM, CVA 1/20, HLD, HTN,  pulmonary HTN, paroxysmal A fib, Obesity who presented to the ER with nausea, vomiting, poor PO intake and generalized malaise. Course complicated by junctional bradycardia for which Cardiology has been consulted.   Patient is followed by heart failure team as outpatient. Has history  of chronic systolic heart failure thought to be ischemic in nature with history of RCA STEMI in the past. Last TTE 07/2021 with EF 30-35%, dyskinetic apex, severe LV dilation, normal RV size and systolic function. She is on chronic O2 at home. Notably, has history of bradycardia with coreg in the past which was stopped. She has been on bisoprolol. The patient reports that she has been compliant with all her medications.   She presented on this admission with nausea, vomiting and poor PO intake. She states that she was visiting family out of town and was trying to avoid drinking water to prevent her from going to the bathroom. She was continuing to take all her heart failure medications including her torsemide. On admission here, she had an AKI with Cr 2.5 that improved to 2.3 today after IVF. Had junctional rhythm on telemetry while on bisoprolol for which cardiology was consulted.  Tele with return to NSR with occasional runs of junctional rhythm and occasional PVCs. Will continue to hold bisoprolol.   GEN: No acute distress. On chronic O2  Neck: JVD difficult to assess due to body habitus Cardiac: RR, no murmurs Respiratory: Diminished but clear GI: Obese, soft, ND, NTTP  MS: No edema; No deformity. Warm Neuro:  Nonfocal  Psych: Normal affect     Plan: -Back in NSR with stopping bisoprolol -Holding GDMT due to AKI, resume once able -Does not appear acutely decompensated from HF standpoint  -No evidence of ACS and trop  negative -Has history of bradycardia with BB in the past -Continue management of nausea and AKI per primary  Cardiology will sign-off. Will arrange for outpatient follow-up.   Gwyndolyn Kaufman, MD

## 2021-10-16 NOTE — Progress Notes (Signed)
Stopped by to check on Pt bipap qhs for midnight rounds and Pt awake and off bipap.  Pt states she is planning on staying up for a while and will have her RN contact RT when she is ready to go back on her bipap.

## 2021-10-16 NOTE — Progress Notes (Signed)
Patient requested to be placed back on BIPAP to nap. Tolerating well at this time

## 2021-10-16 NOTE — Progress Notes (Signed)
PT Cancellation Note  Patient Details Name: Tamara Silva MRN: [id number] DOB: 24-Oct-1960   Cancelled Treatment:    Reason Eval/Treat Not Completed: Patient declined, no reason specified;PT screened, no needs identified, will sign off (Pt reporting she is moving around room without assist and is sitting on BSC at arrival. Reports now that her hip feels better she is not having trouble moving and is back to baseline mod independent level.)Will sign off at this time, please reconsult if pt has a decline in functional mobility/independence.    [name], DPT Acute Rehabilitation Services Office [phone] Pager [phone]  10/16/21 10:57 AM

## 2021-10-17 DIAGNOSIS — N179 Acute kidney failure, unspecified: Secondary | ICD-10-CM | POA: Diagnosis not present

## 2021-10-17 LAB — BASIC METABOLIC PANEL
Anion gap: 8 (ref 5–15)
BUN: 39 mg/dL — ABNORMAL HIGH (ref 8–23)
CO2: 23 mmol/L (ref 22–32)
Calcium: 8.4 mg/dL — ABNORMAL LOW (ref 8.9–10.3)
Chloride: 111 mmol/L (ref 98–111)
Creatinine, Ser: 1.81 mg/dL — ABNORMAL HIGH (ref 0.44–1.00)
GFR, Estimated: 31 mL/min — ABNORMAL LOW (ref >60)
Glucose, Bld: 112 mg/dL — ABNORMAL HIGH (ref 70–99)
Potassium: 4.7 mmol/L (ref 3.5–5.1)
Sodium: 142 mmol/L (ref 135–145)

## 2021-10-17 LAB — GLUCOSE, CAPILLARY: Glucose-Capillary: 116 mg/dL — ABNORMAL HIGH (ref 70–99)

## 2021-10-17 MED ORDER — ENOXAPARIN SODIUM 80 MG/0.8ML IJ SOSY
80.0000 mg | PREFILLED_SYRINGE | INTRAMUSCULAR | Status: DC
  Administered 2021-10-17: 80 mg via SUBCUTANEOUS
  Filled 2021-10-17: qty 0.8

## 2021-10-17 MED ORDER — SENNOSIDES-DOCUSATE SODIUM 8.6-50 MG PO TABS
1.0000 | ORAL_TABLET | Freq: Two times a day (BID) | ORAL | 0 refills | Status: DC
Start: 2021-10-17 — End: 2023-01-18

## 2021-10-17 MED ORDER — BISACODYL 5 MG PO TBEC: 5.0000 mg | DELAYED_RELEASE_TABLET | Freq: Once | ORAL | Status: DC

## 2021-10-17 MED ORDER — POLYETHYLENE GLYCOL 3350 17 G PO PACK: 17.0000 g | PACK | Freq: Two times a day (BID) | ORAL | 0 refills | Status: DC

## 2021-10-17 MED ORDER — BISACODYL 5 MG PO TBEC: 5.0000 mg | DELAYED_RELEASE_TABLET | Freq: Every day | ORAL | 0 refills | Status: DC | PRN

## 2021-10-17 NOTE — Discharge Summary (Signed)
Physician Discharge Summary   Patient: Tamara Silva MRN: [id number] DOB: 01-06-1961  Admit date:     10/14/2021  Discharge date: 10/17/21  Discharge Physician: Elmarie Shiley   PCP: de [place], Raymond J, MD   Recommendations at discharge:   Needs close follow up with cardiology, need Bmet and resumption of entresto if renal function is stable.   Discharge Diagnoses: Principal Problem:   AKI (acute kidney injury) ([place]) Active Problems:   Essential hypertension   Chronic combined systolic and diastolic heart failure ([place])   COPD GOLD III    Dyslipidemia   Diabetes mellitus type 2 in obese (HCC)   Chronic respiratory failure with hypoxia and hypercapnia (HCC)   Hepatic steatosis   BiPAP (biphasic positive airway pressure) dependence   Class 3 obesity (HCC)   Sinus bradycardia   Nausea & vomiting  Resolved Problems:   * No resolved hospital problems. *  Hospital Course: 60 year old with past medical history significant for AICD, asthma, CAD, chronic combined systolic and diastolic heart failure, chronic iron deficiency anemia, history of CVA, type 2 diabetes, lymphedema, hepatic asteatosis, hypertension, history of PE history of respiratory failure, seizure disorder, class III obesity who presents complaining of multiple episode of nausea, decreased appetite, fatigue and malaise for the past few days.  Reports poor oral intake.  She uses Ozempic last Saturday.  She has been on Ozempic for 4 months now.   She was found to have AKI on CKD.  Admitted for hydration.  She was also found to be bradycardic junctional rhythm cardiology consulted.       Assessment and Plan: 1-AKI on CKD stage IIb In the setting poor  oral intake and vomiting.  -Patient creatinine baseline 1.9---2.1 -She presented with a creatinine of 2.5 -She received IV hydration. Stop IV fluids.  Monitor renal function.  Cr down to 1.8 baseline.  Will resume torsemide and spironolactone at discharge. Hold  Entresto.    2-Abdominal pain nausea and vomiting: Could be related to constipation, gastroenteritis versus Ozempic use.  Although she has been on Ozempic for 4 months now Denies further episode of vomiting.  Bowel regimen order. had BM  Tolerating diet.  Discharge on dulcolax.    3-COPD Gold 3, chronic respiratory failure with hypoxia and hypercapnia Continue with supplemental oxygen Continue Anoro Ellipta Continue bronchodilators   Hypertension: Hold Entresto due to AKI.  Hold bisoprolol due to bradycardia Continue with bidil.   Chronic combined systolic and diastolic heart failure: Compensated Plan to resume torsemide and spironolactone at discharge. Hold entresto. Needs follow up renal function.  Discussed with cardiology on call   Dyslipidemia: Continue with rosuvastatin   Bradycardia, junctional rhythm Cardiology consulted Continue to hold bisoprolol.  Hold bisoprolol at discharge.    Morbid Obesity; needs life style modification.      Estimated body mass index is 51.47 kg/m as calculated from the following:   Height as of 10/13/21: 5' 7.5" (1.715 m).   Weight as of 10/13/21: 151.3 kg.          Consultants: Cardiology  Procedures performed: None Disposition: Home Diet recommendation:  Discharge Diet Orders (From admission, onward)     Start     Ordered   10/17/21 0000  Diet - low sodium heart healthy        10/17/21 1146           Cardiac diet DISCHARGE MEDICATION: Allergies as of 10/17/2021       Reactions   Metolazone Other (See Comments)  Dizziness and falling Per patient in 2021, states she does not think she fell because of this medication.   Plavix [clopidogrel Bisulfate] Other (See Comments)   High PRU's - non-responder   Ibuprofen Other (See Comments)   Pt states she is not supposed to take ibuprofen because of other meds she is taking   Nsaids Other (See Comments)   "I do not take NSAIDs d/t interfering with other meds"         Medication List     STOP taking these medications    bisoprolol 5 MG tablet Commonly known as: ZEBETA   Entresto 24-26 MG Generic drug: sacubitril-valsartan   Ozempic (2 MG/DOSE) 8 MG/3ML Sopn Generic drug: Semaglutide (2 MG/DOSE)   potassium chloride SA 20 MEQ tablet Commonly known as: KLOR-CON M       TAKE these medications    Accu-Chek Aviva Plus test strip Generic drug: glucose blood USE 1 STRIP TO CHECK BLOOD SUGAR 3 TIMES A DAY .   Accu-Chek Guide w/Device Kit Use accu chek guide to check blood sugar three times daily.   acetaminophen 500 MG tablet Commonly known as: TYLENOL Take 1,000 mg by mouth every 6 (six) hours as needed for moderate pain or headache.   albuterol (2.5 MG/3ML) 0.083% nebulizer solution Commonly known as: PROVENTIL Take 3 mLs (2.5 mg total) by nebulization every 6 (six) hours as needed for wheezing or shortness of breath.   Anoro Ellipta 62.5-25 MCG/ACT Aepb Generic drug: umeclidinium-vilanterol INHALE 1 PUFF INTO THE LUNGS DAILY.   ascorbic acid 500 MG tablet Commonly known as: VITAMIN C TAKE ONE TABLET BY MOUTH ONCE DAILY   Aspirin Low Dose 81 MG tablet Generic drug: aspirin EC CHEW 1 TABLET (81 MG TOTAL) BY MOUTH DAILY. What changed:  how much to take how to take this   BiDil 20-37.5 MG tablet Generic drug: isosorbide-hydrALAZINE TAKE 1 TABLET BY MOUTH 3 (THREE) TIMES DAILY.   bisacodyl 5 MG EC tablet Commonly known as: DULCOLAX Take 1 tablet (5 mg total) by mouth daily as needed for moderate constipation.   D3-1000 25 MCG (1000 UT) tablet Generic drug: Cholecalciferol Take 1,000 Units by mouth 2 (two) times daily.   Easy Comfort Pen Needles 31G X 5 MM Misc Generic drug: Insulin Pen Needle USE 3 TIMES A DAY FOR INSULIN ADMINISTRATION   FeroSul 325 (65 FE) MG tablet Generic drug: ferrous sulfate TAKE 1 TABLET (325 MG TOTAL) BY MOUTH DAILY AFTER SUPPER. PLEASE TAKE WITH A SOURCE OF VITAMIN C What changed: See the new  instructions.   fluticasone 50 MCG/ACT nasal spray Commonly known as: FLONASE Place 1 spray into both nostrils daily.   FreeStyle Libre 2 Sensor Misc USE EVERY 14 (FOURTEEN) DAYS.   hydrocerin Crea Apply 1 application topically 2 (two) times daily. What changed: when to take this   ipratropium-albuterol 0.5-2.5 (3) MG/3ML Soln Commonly known as: DUONEB INHALE CONTENTS OF ONE VIAL USING NEBULIZER EVERY 6 HOURS AS NEEDED FOR SHORTNESS OF BREATH, WHEEZING What changed:  how much to take when to take this additional instructions   Jardiance 10 MG Tabs tablet Generic drug: empagliflozin TAKE ONE TABLET BY MOUTH ONCE DAILY BEFORE BREAKFAST What changed: See the new instructions.   NEEDLE (DISP) 26 G 26G X 1/2" Misc 1 application by Does not apply route daily.   omeprazole 40 MG capsule Commonly known as: PRILOSEC Take 1 capsule (40 mg total) by mouth daily.   polyethylene glycol 17 g packet Commonly known as: MIRALAX /  GLYCOLAX Take 17 g by mouth 2 (two) times daily.   Restasis 0.05 % ophthalmic emulsion Generic drug: cycloSPORINE Place 1 drop into both eyes daily as needed (dry eyes).   rosuvastatin 40 MG tablet Commonly known as: CRESTOR TAKE 1 TABLET (40 MG TOTAL) BY MOUTH DAILY AT 6 PM. What changed: when to take this   senna-docusate 8.6-50 MG tablet Commonly known as: Senokot-S Take 1 tablet by mouth 2 (two) times daily.   spironolactone 25 MG tablet Commonly known as: ALDACTONE Take 25 mg by mouth daily.   torsemide 100 MG tablet Commonly known as: DEMADEX Take 1 tablet (100 mg total) by mouth in the morning AND 0.5 tablets (50 mg total) every evening.   Tyler Aas FlexTouch 200 UNIT/ML FlexTouch Pen Generic drug: insulin degludec INJECT 60 UNITS UNDER THE SKIN ONCE DAILY What changed:  how much to take when to take this additional instructions   vitamin B-12 1000 MCG tablet Commonly known as: CYANOCOBALAMIN Take 1,000 mcg by mouth daily.         Follow-up Information     de [place], Blondell Reveal, MD Follow up in 1 week(s).   Specialty: Family Medicine Contact information: [place] [address] [phone]         Larey Dresser, MD Follow up.   Specialty: Cardiology Contact information: [address] [place] [address] [phone]         Constance Haw, MD .   Specialty: Cardiology Contact information: [address] [place] [address] [phone]         Sueanne Margarita, MD .   Specialty: Cardiology Contact information: [phone] [address] [place] [address] [phone]                Discharge Exam: Danley Danker Weights   10/17/21 0320  Weight: (!) 159.3 kg   She is feeling better, no more BM  Tolerating diet   General; NAD Lung; CTA  Condition at discharge: stable  The results of significant diagnostics from this hospitalization (including imaging, microbiology, ancillary and laboratory) are listed below for reference.   Imaging Studies: CT ABDOMEN PELVIS WO CONTRAST  Result Date: 10/14/2021 CLINICAL DATA:  Abdominal pain, acute, nonlocalized EXAM: CT ABDOMEN AND PELVIS WITHOUT CONTRAST TECHNIQUE: Multidetector CT imaging of the abdomen and pelvis was performed following the standard protocol without IV contrast. RADIATION DOSE REDUCTION: This exam was performed according to the departmental dose-optimization program which includes automated exposure control, adjustment of the mA and/or kV according to patient size and/or use of iterative reconstruction technique. COMPARISON:  CT dated May 29, 2018; February 01, 2014. FINDINGS: Evaluation is limited by lack of IV contrast. Lower chest: Revisualization calcified pleural plaques in the LEFT lower lobe. There is revisualization of several areas of bandlike opacities of bilateral bases most consistent with scarring/rounded atelectasis, mildly increased since 2015. Irregular  opacity superior to the rounded atelectasis appears increased in comparison to prior and spans approximately 25 mm (series 2, image 1). Patchy centrilobular nodularity of the RIGHT lower lobe is similar comparison to prior. Paraseptal emphysema. Cardiomegaly. Hepatobiliary: Hepatic steatosis. Gallbladder is unremarkable. Coarse calcification along the hepatic dome, unchanged. Pancreas: No peripancreatic fat stranding. Spleen: Unremarkable. Adrenals/Urinary Tract: Adrenal glands are unremarkable. No hydronephrosis. No obstructing nephrolithiasis bladder is decompressed. Stomach/Bowel: No evidence of bowel obstruction. Moderate colonic stool burden diffusely throughout the colon. Appendix is normal. Stomach is decompressed. Likely tiny hiatal hernia. Vascular/Lymphatic: Abdominal aorta  is normal in course and caliber with scattered atherosclerotic calcifications. Revisualization of multiple prominent subcentimeter retroperitoneal lymph nodes. Representative aortocaval lymph node measures 9 mm in the short axis, unchanged in comparison to prior (series 2, image 51). Reproductive: Uterus and bilateral adnexa are unremarkable. Other: No free air or free fluid. Musculoskeletal: Faint serpiginous sclerosis of the RIGHT femoral head may reflect underlying avascular necrosis. No evidence of femoral head flattening. IMPRESSION: 1. No suspicious CT etiology for acute abdominal pain is identified. Moderate colonic stool burden. 2. Revisualization of calcified pleural plaques with associated rounded atelectasis in the LEFT lower lobe. One area of 25 mm irregular consolidation at the edge of the CT field of view appears increased in comparison to prior. This is nonspecific and could reflect progression of scarring versus superimposed infection. Recommend follow-up CT chest in 3 months to assess for evolution. 3. Query mild RIGHT femoral avascular necrosis. This could be further assessed with dedicated nonemergent MRI if  clinically indicated. Aortic Atherosclerosis (ICD10-I70.0) and Emphysema (ICD10-J43.9). Electronically Signed   By: Valentino Saxon M.D.   On: 10/14/2021 13:28   DG Chest 1 View  Result Date: 10/14/2021 CLINICAL DATA:  Shortness of breath EXAM: CHEST  1 VIEW COMPARISON:  04/25/2021 FINDINGS: Pulmonary vascular congestion and patchy opacities bilaterally. No significant pleural effusion. Cardiomegaly. Left chest wall ICD. IMPRESSION: Pulmonary vascular congestion and patchy opacities bilaterally favored to reflect edema. Cardiomegaly. Electronically Signed   By: Macy Mis M.D.   On: 10/14/2021 09:54   DG Hip Unilat W or Wo Pelvis 2-3 Views Left  Result Date: 10/14/2021 CLINICAL DATA:  Left hip pain EXAM: DG HIP (WITH OR WITHOUT PELVIS) 2-3V LEFT COMPARISON:  None Available. FINDINGS: Evaluation is limited due to overlying soft tissues which significantly degrades image quality. No displaced fracture identified. Left hip joint space normal. Much of the pelvis obscured due to overlying soft tissues and inadequate technique. IMPRESSION: Limited study due to patient size. No displaced fracture left hip. Consider CT if symptoms continue. Electronically Signed   By: Franchot Gallo M.D.   On: 10/14/2021 10:06    Microbiology: Results for orders placed or performed during the hospital encounter of 10/14/21  Resp Panel by RT-PCR (Flu A&B, Covid) Nasopharyngeal Swab     Status: None   Collection Time: 10/14/21  9:01 AM   Specimen: Nasopharyngeal Swab; Nasopharyngeal(NP) swabs in vial transport medium  Result Value Ref Range Status   SARS Coronavirus 2 by RT PCR NEGATIVE NEGATIVE Final    Comment: (NOTE) SARS-CoV-2 target nucleic acids are NOT DETECTED.  The SARS-CoV-2 RNA is generally detectable in upper respiratory specimens during the acute phase of infection. The lowest concentration of SARS-CoV-2 viral copies this assay can detect is 138 copies/mL. A negative result does not preclude  SARS-Cov-2 infection and should not be used as the sole basis for treatment or other patient management decisions. A negative result may occur with  improper specimen collection/handling, submission of specimen other than nasopharyngeal swab, presence of viral mutation(s) within the areas targeted by this assay, and inadequate number of viral copies(<138 copies/mL). A negative result must be combined with clinical observations, patient history, and epidemiological information. The expected result is Negative.  Fact Sheet for Patients:  EntrepreneurPulse.com.au  Fact Sheet for Healthcare Providers:  IncredibleEmployment.be  This test is no t yet approved or cleared by the [place] FDA and  has been authorized for detection and/or diagnosis of SARS-CoV-2 by FDA under an Emergency Use Authorization (EUA). This EUA will  remain  in effect (meaning this test can be used) for the duration of the COVID-19 declaration under Section 564(b)(1) of the Act, 21 U.S.C.section 360bbb-3(b)(1), unless the authorization is terminated  or revoked sooner.       Influenza A by PCR NEGATIVE NEGATIVE Final   Influenza B by PCR NEGATIVE NEGATIVE Final    Comment: (NOTE) The Xpert Xpress SARS-CoV-2/FLU/RSV plus assay is intended as an aid in the diagnosis of influenza from Nasopharyngeal swab specimens and should not be used as a sole basis for treatment. Nasal washings and aspirates are unacceptable for Xpert Xpress SARS-CoV-2/FLU/RSV testing.  Fact Sheet for Patients: EntrepreneurPulse.com.au  Fact Sheet for Healthcare Providers: IncredibleEmployment.be  This test is not yet approved or cleared by the [place] FDA and has been authorized for detection and/or diagnosis of SARS-CoV-2 by FDA under an Emergency Use Authorization (EUA). This EUA will remain in effect (meaning this test can be used) for the duration of  the COVID-19 declaration under Section 564(b)(1) of the Act, 21 U.S.C. section 360bbb-3(b)(1), unless the authorization is terminated or revoked.  Performed at [facility], [place] [address] [place], [place] [address]    *Note: Due to a large number of results and/or encounters for the requested time period, some results have not been displayed. A complete set of results can be found in Results Review.    Labs: CBC: Recent Labs  Lab 10/13/21 2031 10/14/21 0901 10/15/21 0647  WBC 7.0 6.2 4.4  NEUTROABS  --  5.2  --   HGB 11.6* 11.5* 10.4*  HCT 39.0 37.9 34.8*  MCV 81.1 82.6 84.1  PLT 298 256 884   Basic Metabolic Panel: Recent Labs  Lab 10/13/21 2031 10/14/21 0901 10/15/21 0647 10/15/21 1000 10/16/21 0425 10/17/21 0726  NA 138 139 136  --  137 142  K 5.2* 4.5 4.2  --  4.4 4.7  CL 101 103 108  --  106 111  CO2 _0 --  24 23  GLUCOSE 106* 114* 113*  --  99 112*  BUN 39* 55* 54*  --  62* 39*  CREATININE 1.73* 2.52* 2.32*  --  2.59* 1.81*  CALCIUM 9.5 8.6* 7.7*  --  8.0* 8.4*  MG  --   --   --  1.8  --   --    Liver Function Tests: Recent Labs  Lab 10/13/21 2031 10/14/21 0901  AST 16 14*  ALT 12 13  ALKPHOS 59 52  BILITOT 0.4 0.5  PROT 8.7* 8.0  ALBUMIN 3.9 3.6   CBG: Recent Labs  Lab 10/16/21 0725 10/16/21 1136 10/16/21 1659 10/16/21 2110 10/17/21 0733  GLUCAP 101* 136* 146* 107* 116*    Discharge time spent: greater than 30 minutes.  Signed: Elmarie Shiley, MD [facility] 10/17/2021

## 2021-10-17 NOTE — TOC Transition Note (Signed)
Transition of Care [facility]) - CM/SW Discharge Note   Patient Details  Name: Tamara Silva MRN: [id number] Date of Birth: Mar 26, 1961  [facility] CM/SW Contact:  Ross Ludwig, LCSW Phone Number: 10/17/2021, 12:51 PM   Clinical Narrative:     Patient to follow up with [employer] in regards to getting her wheelchair repaired.  Contact information added to AVS.  TOC signing off, please reconsult if other TOC needs arise.  Final next level of care: Home/Self Care Barriers to Discharge: Barriers Resolved   Patient Goals and CMS Choice Patient states their goals for this hospitalization and ongoing recovery are:: To return back home. CMS Medicare.gov Compare Post Acute Care list provided to:: Patient Choice offered to / list presented to : Patient  Discharge Placement                       Discharge Plan and Services     Post Acute Care Choice: Durable Medical Equipment                               Social Determinants of Health (SDOH) Interventions     Readmission Risk Interventions     View : No data to display.

## 2021-10-19 ENCOUNTER — Telehealth (HOSPITAL_COMMUNITY): Payer: Self-pay | Admitting: *Deleted

## 2021-10-19 ENCOUNTER — Other Ambulatory Visit: Payer: Self-pay | Admitting: Endocrinology

## 2021-10-19 DIAGNOSIS — E1165 Type 2 diabetes mellitus with hyperglycemia: Secondary | ICD-10-CM

## 2021-10-19 NOTE — Telephone Encounter (Signed)
Pt called to ask about her medications and sch f/u appt post hosp. Pt states a lot of her meds were stopped in hospital, she was d/c'd home Sat. She reports she did stop KCL, entresto, and bisoprolol, she took her Ozempic yesterday and has not been taking her Torsemide or Arlyce Harman as she thought she was supposed to stop those as well. Advised pt to take Torsemide and Arlyce Harman, f/u appt sch for Demetrius Charity (pt unable to come sooner due to transportation), she is aware to bring all medications with her to appt.

## 2021-10-20 ENCOUNTER — Ambulatory Visit (INDEPENDENT_AMBULATORY_CARE_PROVIDER_SITE_OTHER): Payer: Medicaid Other

## 2021-10-20 DIAGNOSIS — I469 Cardiac arrest, cause unspecified: Secondary | ICD-10-CM

## 2021-10-20 NOTE — Progress Notes (Signed)
IDRaeleen Silva, DOB 1960-11-10, MRN [id number]   Provider location: [place] [facility] Type of Visit: Established patient   PCP:  de [place], Raymond J, MD Endocrinology: Dr. Dwyane Dee  HF Cardiology: Dr Aundra Dubin   History of Present Illness: Tamara Silva is a 61 y.o. 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  She has had frequent admits for anemia and CHF with transfusions and diuresis. Prasugrel was stopped. No source of bleeding found on EGD and colonoscopy.   She is on home 02 and nocturnal BiPAP for OHS/OSA and COPD.   She no longer smokes.  Hx of PE in 2014.    Hx of bradycardia with Coreg at higher dose.  Echo 05/31/18 with EF 40-45%, PA peak pressure 71 mmHg.    Admitted 10/25/18 with SOB and edema, + productive cough.  This started over a few weeks prior to admission. Hospital course complicated by PEA arrest on 10/28/18. CPR for 25 minutes. Extubated 10/30/18.  Developed AKI.  HF team followed closely to optimize HF.  She was massively volume overloaded and extensively diuresed, weight down 65 lbs total.  Echo showed EF down to 30-35%.  No coronary angiography due to AKI.   Readmitted  11/17/18 with lower extremity edema and volume overload. Diuresed with IV lasix and transitioned to torsemide 40 mg twice daily. Discharged on 11/21/18 to home on oxygen.   She had RHC/LHC in 9/20, most significant stenosis was 75% ostial D1.  There was mild to moderate nonobstructive disease in other vessels. There was not an interventional target.  Filling pressures were mildly elevated with preserved cardiac output.  Torsemide was increased.   Echo in 10/20 showed that EF remained 30-35% with wall motion abnormalities, RV appeared normal.   Medtronic ICD placed in 11/20.   She was lost to follow up since 7/21. Returned 1/23 and had been off metolazone for several  months. She was mildly SOB walking around her house. Repeat echo and paramedicine arranged.  Echo in 2/23 showed EF 30-35%, dyskinetic apex, severe LV dilation, normal RV size and systolic function.  Today she returns for HF follow up.Overall feeling fine. Uses a wheel chair when out of the house. SOB with exertion. Denies PND/Orthopnea. Wears 2 liters .Appetite ok. No fever or chills. Weight at home  has been stable. Occasionally misses torsemide if she has doctors appointments. Taking all medications. Lives with her boyfriend.   MDT device interrogation: Impedance well below threshold. NO VT . Activity < 1 hour per day.   Labs (7/20): K 3.7, creatinine 1.56 Labs (9/20): K 3.7, creatinine 1.46, LDL 52, HDL 56  Labs (12/20): K 3.8, creatinine 1.51 Labs (1/21): hgb 10.6, K 4.4, creatinine 1.91 Labs (5/21): LDL 68, HDL 56 Labs (7/21): K 4.4, creatinine 2.34, hgb 9.7 Labs (11/22): K 4.4., creatinine 2.30, hgb 9.3 Labs (1/23): K 4.0, creatinine 2.32 Labs (2/23): LDL 55, TGs 74 Labs (3/23): K 4.4, creatinine 2.33 Labs (09/14/21): Creatinine 1.88  PMH: 1. CVA (1/20). 2. Type 2 diabetes 3. HTN 4. H/o seizures 5. PEA arrest (5/20) with CPR.  6. COPD: Home oxygen.  Stopped smoking in 1/15.  7. CAD: PCI [facility] 2006, [facility] 2009.  - Inferior STEMI 5/15, DES to RCA.  - Poor responder to Plavix.  - LHC (9/20): 75% ostial  D1, 40-50% mLCx, 40% proximal PLV.  8. Hyperlipidemia 9. PE: 2014, LLL.  10. OHS/OSA: Home oxygen and Bipap used.  11. Chronic systolic CHF: Suspect primarily ischemic cardiomyopathy. Medtronic ICD.  - Echo (5/15): EF 35-40% - Echo (2/16): EF 40-45% - Echo (1/20): EF 40-45% - Echo (5/20): EF 30-35%, mild RV dilation.  - RHC (5/20): mean RA 9, PA 62/22 mean 37, mean PCWP 15, CI 4.29, PVR 2.1 WU.  - RHC (9/20): mean RA 11, PA 66/18 mean 37, mean PCWP 19, CI 3.12, PVR 2.4 WU - Echo (10/20): EF 30-35% with wall motion abnormalities, normal RV.  - Echo (2/23): EF  30-35%, dyskinetic apex, severe LV dilation, normal RV size and systolic function. 12. CKD: Stage 3.  13. Fe deficiency anemia: EGD and colonoscopy without definite source of bleeding.   ROS: All systems negative except as listed in HPI, PMH and Problem List.  SH:  Social History   Socioeconomic History   Marital status: Single    Spouse name: Not on file   Number of children: 1   Years of education: Not on file   Highest education level: Not on file  Occupational History   Not on file  Tobacco Use   Smoking status: Former    Packs/day: 0.50    Years: 20.00    Pack years: 10.00    Types: Cigarettes    Quit date: 05/31/2018    Years since quitting: 3.3   Smokeless tobacco: Never   Tobacco comments:    smoked off and on x 20 years  Vaping Use   Vaping Use: Never used  Substance and Sexual Activity   Alcohol use: No    Alcohol/week: 0.0 standard drinks   Drug use: Not Currently    Types: Cocaine   Sexual activity: Not Currently  Other Topics Concern   Not on file  Social History Narrative   Origibnally from [place]   Most recently from [place]   Daughter lives in town      On Social security to CHF, COPD, CAD   Social Determinants of Health   Financial Resource Strain: Not on file  Food Insecurity: Not on file  Transportation Needs: Not on file  Physical Activity: Not on file  Stress: Not on file  Social Connections: Not on file  Intimate Partner Violence: Not on file   FH:  Family History  Problem Relation Age of Onset   Stroke Mother    Hypertension Mother    Colon cancer Father    Diabetes Father    Heart attack Father    Sarcoidosis Other    Breast cancer Paternal Aunt    Emphysema Brother    Lung disease Brother        Unknown type, 3 brothers, one with liver and lung disease   Diabetes Brother    Asthma Paternal Aunt    Diabetes Sister     Current Outpatient Medications  Medication Sig Dispense Refill   ACCU-CHEK AVIVA PLUS test strip USE  1 STRIP TO CHECK BLOOD SUGAR 3 TIMES A DAY . 100 each 0   acetaminophen (TYLENOL) 500 MG tablet Take 1,000 mg by mouth every 6 (six) hours as needed for moderate pain or headache.     albuterol (PROVENTIL) (2.5 MG/3ML) 0.083% nebulizer solution Take 3 mLs (2.5 mg total) by nebulization every 6 (six) hours as needed for wheezing or shortness of breath. 360 mL 5   ANORO ELLIPTA 62.5-25 MCG/INH AEPB INHALE 1 PUFF INTO  THE LUNGS DAILY. 60 each 5   ascorbic acid (VITAMIN C) 500 MG tablet TAKE ONE TABLET BY MOUTH ONCE DAILY (Patient taking differently: Take 500 mg by mouth daily.) 90 tablet 3   ASPIRIN LOW DOSE 81 MG EC tablet CHEW 1 TABLET (81 MG TOTAL) BY MOUTH DAILY. (Patient taking differently: 81 mg daily.) 90 tablet 3   BIDIL 20-37.5 MG tablet TAKE 1 TABLET BY MOUTH 3 (THREE) TIMES DAILY. 270 tablet 3   bisacodyl (DULCOLAX) 5 MG EC tablet Take 1 tablet (5 mg total) by mouth daily as needed for moderate constipation. 30 tablet 0   Blood Glucose Monitoring Suppl (ACCU-CHEK GUIDE) w/Device KIT Use accu chek guide to check blood sugar three times daily. 1 kit 0   Continuous Blood Gluc Sensor (FREESTYLE LIBRE 2 SENSOR) MISC USE EVERY 14 (FOURTEEN) DAYS. 2 each 3   D3-1000 25 MCG (1000 UT) tablet Take 1,000 Units by mouth 2 (two) times daily.     FEROSUL 325 (65 Fe) MG tablet TAKE 1 TABLET (325 MG TOTAL) BY MOUTH DAILY AFTER SUPPER. PLEASE TAKE WITH A SOURCE OF VITAMIN C (Patient taking differently: 325 mg every evening.) 90 tablet 3   fluticasone (FLONASE) 50 MCG/ACT nasal spray Place 1 spray into both nostrils daily. 16 g 6   hydrocerin (EUCERIN) CREA Apply 1 application topically 2 (two) times daily. (Patient taking differently: Apply 1 application. topically daily.)  0   insulin degludec (TRESIBA FLEXTOUCH) 200 UNIT/ML FlexTouch Pen INJECT 60 UNITS UNDER THE SKIN ONCE DAILY (Patient taking differently: 65 Units daily.) 18 mL 3   Insulin Pen Needle (EASY COMFORT PEN NEEDLES) 31G X 5 MM MISC USE 3 TIMES A  DAY FOR INSULIN ADMINISTRATION 100 each 12   ipratropium-albuterol (DUONEB) 0.5-2.5 (3) MG/3ML SOLN INHALE CONTENTS OF ONE VIAL USING NEBULIZER EVERY 6 HOURS AS NEEDED FOR SHORTNESS OF BREATH, WHEEZING (Patient taking differently: 3 mLs in the morning, at noon, in the evening, and at bedtime.) 360 mL 5   JARDIANCE 10 MG TABS tablet TAKE ONE TABLET BY MOUTH ONCE DAILY BEFORE BREAKFAST (Patient taking differently: 10 mg daily.) 30 tablet 1   NEEDLE, DISP, 26 G 26G X 1/2" MISC 1 application by Does not apply route daily. 100 each 0   omeprazole (PRILOSEC) 40 MG capsule Take 1 capsule (40 mg total) by mouth daily. 90 capsule 0   polyethylene glycol (MIRALAX / GLYCOLAX) 17 g packet Take 17 g by mouth 2 (two) times daily. 14 each 0   RESTASIS 0.05 % ophthalmic emulsion Place 1 drop into both eyes daily as needed (dry eyes).     rosuvastatin (CRESTOR) 40 MG tablet TAKE 1 TABLET (40 MG TOTAL) BY MOUTH DAILY AT 6 PM. (Patient taking differently: Take 40 mg by mouth daily.) 90 tablet 0   senna-docusate (SENOKOT-S) 8.6-50 MG tablet Take 1 tablet by mouth 2 (two) times daily. 30 tablet 0   spironolactone (ALDACTONE) 25 MG tablet Take 25 mg by mouth daily.     torsemide (DEMADEX) 100 MG tablet Take 1 tablet (100 mg total) by mouth in the morning AND 0.5 tablets (50 mg total) every evening. 45 tablet 3   vitamin B-12 (CYANOCOBALAMIN) 1000 MCG tablet Take 1,000 mcg by mouth daily.     No current facility-administered medications for this visit.   LMP 07/28/2013 Comment: perimenopausal  Wt Readings from Last 3 Encounters:  10/17/21 (!) 159.3 kg (351 lb 3.1 oz)  10/13/21 (!) 151.3 kg (333 lb 8.9 oz)  10/01/21 [name] Kitchen)  151.3 kg (333 lb 9.6 oz)   There were no vitals filed for this visit.   Physical Exam:   General:  Arrived in a wheelchair.  No resp difficulty HEENT: normal Neck: supple. no JVD. Carotids 2+ bilat; no bruits. No lymphadenopathy or thryomegaly appreciated. Cor: PMI nondisplaced. Regular rate &  rhythm. No rubs, gallops or murmurs. Lungs: clear on 2 liters  Abdomen: soft, nontender, nondistended. No hepatosplenomegaly. No bruits or masses. Good bowel sounds. Extremities: no cyanosis, clubbing, rash, edema Neuro: alert & orientedx3, cranial nerves grossly intact. moves all 4 extremities w/o difficulty. Affect pleasant  ASSESSMENT & PLAN: 1. H/O PEA arrest: Due to severe hypoxemia in 5/20 in setting of CHF exacerbation.   2. Chronic systolic CHF: Suspect ischemic cardiomyopathy.  Medtronic ICD.  Echo in 1/20 with EF 40-45%, RV moderately dilated, PASP 71 mmHg.  Echo in 5/20 with lower EF, 30-35%, and mildly dilated RV.  Suspect significant RV failure, may be related to OHS/OSA and COPD. She was massively volume overloaded during 5/20 admission and diuresed extensively.  RHC done 11/06/18 showed high cardiac output and minimally elevated filling pressures after diuresis.  PVR was not elevated, appeared to be high output PH.  RHC was done again in 9/20, filling pressures were elevated with preserved cardiac output.  Echo in 10/20 showed EF still 30-35%.  Echo in 2/23 with EF 30-35% RV normal. Optivol reviewed.  NYHA III.  Volume status. Continue torsemide from 100 mg /50 qpm.  Continue KCl to 20 mEq daily.  - Continue Jardiance 10 mg daily. No GU symptoms.    - Continue Bidil 1 tab tid.   - Continue spironolactone 25 mg daily. - Continue Entresto 24/26 mg bid.  - Continue bisoprolol 5 mg daily (more beta-1 selective).  - She is enrolled in monthly ICM for volume management. 3. CKD: Stage III. She sees Dr. Royce Macadamia. BMET today. 4. COPD: On home oxygen at baseline.  Has quit smoking.  5. OHS/OSA: On home oxygen at all times and wears Trilogy at night. She is followed by Pulmonary. 6. CAD: History of PCI, most recently had inferior STEMI in 5/15 with DES to RCA.  Coronary angiography was done in 9/20, there was 75% ostial D1 stenosis but no good target for PCI. No chest pain.  - Continue ASA 81 and  Crestor 40 daily. Good lipids in 2/23.  7. Remote PE: She has not been anticoagulated.  8. Anemia: History of GI bleeding, had endoscopies earlier in 2021 with no bleeding source found.  Also with history of B12 deficiency. She has seen Dr. Lorenso Courier for this. 9. Pulmonary hypertension: Pulmonary venous hypertension on last RHC in 9/20.  10. Obesity: There is no height or weight on file to calculate BMI. We discussed diet and exercise.  - She is now on semaglutide. 11. Atrial fibrillation: No recent runs    Follow up 3-4 months with Dr Aundra Dubin    Signed, Rafael Bihari, FNP  10/20/2021  [place] [address] [facility] [place] [address] [phone] (office) [phone] (fax)

## 2021-10-21 ENCOUNTER — Telehealth (HOSPITAL_COMMUNITY): Payer: Self-pay

## 2021-10-21 LAB — CUP PACEART REMOTE DEVICE CHECK
Battery Remaining Longevity: 121 mo
Battery Voltage: 3.01 V
Brady Statistic RV Percent Paced: 0.09 %
Date Time Interrogation Session: 20230523031805
HighPow Impedance: 65 Ohm
Implantable Lead Implant Date: 20201104
Implantable Lead Location: 753860
Implantable Pulse Generator Implant Date: 20201104
Lead Channel Impedance Value: 266 Ohm
Lead Channel Impedance Value: 323 Ohm
Lead Channel Pacing Threshold Amplitude: 0.5 V
Lead Channel Pacing Threshold Pulse Width: 0.4 ms
Lead Channel Sensing Intrinsic Amplitude: 15 mV
Lead Channel Sensing Intrinsic Amplitude: 15 mV
Lead Channel Setting Pacing Amplitude: 2.5 V
Lead Channel Setting Pacing Pulse Width: 0.4 ms
Lead Channel Setting Sensing Sensitivity: 0.3 mV

## 2021-10-21 NOTE — Telephone Encounter (Signed)
Called to confirm/remind patient of their appointment at the [facility] on 10/22/21.   Patient reminded to bring all medications and/or complete list.  Confirmed patient has transportation. Gave directions, instructed to utilize [facility] parking.  Confirmed appointment prior to ending call.

## 2021-10-22 ENCOUNTER — Encounter (HOSPITAL_COMMUNITY): Payer: Self-pay

## 2021-10-22 ENCOUNTER — Ambulatory Visit (HOSPITAL_COMMUNITY)
Admission: RE | Admit: 2021-10-22 | Discharge: 2021-10-22 | Disposition: A | Discharge status: Home / Self Care | Payer: Medicaid Other | Source: Ambulatory Visit | Attending: Family Medicine | Admitting: Family Medicine

## 2021-10-22 VITALS — BP 130/85 | HR 75 | Wt 328.6 lb

## 2021-10-22 DIAGNOSIS — D649 Anemia, unspecified: Secondary | ICD-10-CM

## 2021-10-22 DIAGNOSIS — Z955 Presence of coronary angioplasty implant and graft: Secondary | ICD-10-CM | POA: Insufficient documentation

## 2021-10-22 DIAGNOSIS — Z79899 Other long term (current) drug therapy: Secondary | ICD-10-CM | POA: Diagnosis not present

## 2021-10-22 DIAGNOSIS — I252 Old myocardial infarction: Secondary | ICD-10-CM | POA: Diagnosis not present

## 2021-10-22 DIAGNOSIS — Z87891 Personal history of nicotine dependence: Secondary | ICD-10-CM | POA: Diagnosis not present

## 2021-10-22 DIAGNOSIS — J449 Chronic obstructive pulmonary disease, unspecified: Secondary | ICD-10-CM | POA: Insufficient documentation

## 2021-10-22 DIAGNOSIS — E785 Hyperlipidemia, unspecified: Secondary | ICD-10-CM | POA: Diagnosis not present

## 2021-10-22 DIAGNOSIS — Z8673 Personal history of transient ischemic attack (TIA), and cerebral infarction without residual deficits: Secondary | ICD-10-CM | POA: Insufficient documentation

## 2021-10-22 DIAGNOSIS — N179 Acute kidney failure, unspecified: Secondary | ICD-10-CM | POA: Diagnosis not present

## 2021-10-22 DIAGNOSIS — Z9981 Dependence on supplemental oxygen: Secondary | ICD-10-CM | POA: Diagnosis not present

## 2021-10-22 DIAGNOSIS — E1122 Type 2 diabetes mellitus with diabetic chronic kidney disease: Secondary | ICD-10-CM | POA: Insufficient documentation

## 2021-10-22 DIAGNOSIS — G4733 Obstructive sleep apnea (adult) (pediatric): Secondary | ICD-10-CM

## 2021-10-22 DIAGNOSIS — I130 Hypertensive heart and chronic kidney disease with heart failure and stage 1 through stage 4 chronic kidney disease, or unspecified chronic kidney disease: Secondary | ICD-10-CM | POA: Insufficient documentation

## 2021-10-22 DIAGNOSIS — I4891 Unspecified atrial fibrillation: Secondary | ICD-10-CM | POA: Diagnosis not present

## 2021-10-22 DIAGNOSIS — N183 Chronic kidney disease, stage 3 unspecified: Secondary | ICD-10-CM | POA: Diagnosis not present

## 2021-10-22 DIAGNOSIS — E669 Obesity, unspecified: Secondary | ICD-10-CM | POA: Insufficient documentation

## 2021-10-22 DIAGNOSIS — I5022 Chronic systolic (congestive) heart failure: Secondary | ICD-10-CM | POA: Insufficient documentation

## 2021-10-22 DIAGNOSIS — Z7984 Long term (current) use of oral hypoglycemic drugs: Secondary | ICD-10-CM | POA: Insufficient documentation

## 2021-10-22 DIAGNOSIS — I2510 Atherosclerotic heart disease of native coronary artery without angina pectoris: Secondary | ICD-10-CM | POA: Insufficient documentation

## 2021-10-22 DIAGNOSIS — I272 Pulmonary hypertension, unspecified: Secondary | ICD-10-CM | POA: Diagnosis not present

## 2021-10-22 DIAGNOSIS — I469 Cardiac arrest, cause unspecified: Secondary | ICD-10-CM | POA: Diagnosis not present

## 2021-10-22 DIAGNOSIS — Z8674 Personal history of sudden cardiac arrest: Secondary | ICD-10-CM | POA: Insufficient documentation

## 2021-10-22 DIAGNOSIS — R058 Other specified cough: Secondary | ICD-10-CM | POA: Insufficient documentation

## 2021-10-22 DIAGNOSIS — Z86711 Personal history of pulmonary embolism: Secondary | ICD-10-CM

## 2021-10-22 DIAGNOSIS — Z6841 Body Mass Index (BMI) 40.0 and over, adult: Secondary | ICD-10-CM | POA: Insufficient documentation

## 2021-10-22 LAB — BASIC METABOLIC PANEL
Anion gap: 10 (ref 5–15)
BUN: 17 mg/dL (ref 8–23)
CO2: 26 mmol/L (ref 22–32)
Calcium: 8.9 mg/dL (ref 8.9–10.3)
Chloride: 101 mmol/L (ref 98–111)
Creatinine, Ser: 1.83 mg/dL — ABNORMAL HIGH (ref 0.44–1.00)
GFR, Estimated: 31 mL/min — ABNORMAL LOW (ref >60)
Glucose, Bld: 119 mg/dL — ABNORMAL HIGH (ref 70–99)
Potassium: 3.8 mmol/L (ref 3.5–5.1)
Sodium: 137 mmol/L (ref 135–145)

## 2021-10-22 LAB — BRAIN NATRIURETIC PEPTIDE: B Natriuretic Peptide: 78.5 pg/mL (ref 0.0–100.0)

## 2021-10-22 NOTE — Patient Instructions (Signed)
There has been no changes to your medication.  Labs done today, your results will be available in MyChart, we will contact you for abnormal readings.  PLEASE MAKE A FOLLOW UP APPOINTMENT WITH DR. Dwyane Dee AS SOON AS YOU CAN.   Please follow up with our heart failure pharmacist in 3-4 weeks  Your physician recommends that you schedule a follow-up appointment with Dr. Aundra Dubin as scheduled.   If you have any questions or concerns before your next appointment please send [place] a message through [place] or call our office at [phone].    TO LEAVE A MESSAGE FOR THE NURSE SELECT OPTION 2, PLEASE LEAVE A MESSAGE INCLUDING: YOUR NAME DATE OF BIRTH CALL BACK NUMBER REASON FOR CALL**this is important as we prioritize the call backs  YOU WILL RECEIVE A CALL BACK THE SAME DAY AS LONG AS YOU CALL BEFORE 4:00 PM  At the [facility], you and your health needs are our priority. As part of our continuing mission to provide you with exceptional heart care, we have created designated Provider Care Teams. These Care Teams include your primary Cardiologist (physician) and Advanced Practice Providers (APPs- Physician Assistants and Nurse Practitioners) who all work together to provide you with the care you need, when you need it.   You may see any of the following providers on your designated Care Team at your next follow up: Dr Glori Bickers Dr Haynes Kerns, NP Lyda Jester, [place] [facility] [place], [place] Audry Riles, PharmD   Please be sure to bring in all your medications bottles to every appointment.

## 2021-10-28 NOTE — Progress Notes (Signed)
Advanced Heart Failure Clinic Note   PCP:  de [place], Raymond J, MD Endocrinology: Dr. Dwyane Dee     HF Cardiology: Dr Aundra Dubin  HPI:  Tamara Silva is a 61 y.o. who has a hx of chronic systolic CHF, HTN, DM-2, HLD, CVA 05/2018 and CAD (PCI in 2006 and 2009 - not sure which vessel) and then in 2015 STEMI with Xience DES to distal RCA.  She has had frequent admits for anemia and CHF with transfusions and diuresis. Prasugrel was stopped. No source of bleeding found on EGD and colonoscopy.   She is on home O2 and nocturnal BiPAP for OHS/OSA and COPD.   She no longer smokes.  Hx of PE in 2014.    Hx of bradycardia with carvedilol at higher dose.  Echo 05/31/18 with EF 40-45%, PA peak pressure 71 mmHg.     Admitted 10/25/18 with SOB and edema, + productive cough.  This started over a few weeks prior to admission. Hospital course complicated by PEA arrest on 10/28/18. CPR for 25 minutes. Extubated 10/30/18.  Developed AKI.  HF team followed closely to optimize HF.  She was massively volume overloaded and extensively diuresed, weight down 65 lbs total.  Echo showed EF down to 30-35%.  No coronary angiography due to AKI.    Readmitted  11/17/18 with lower extremity edema and volume overload. Diuresed with IV Lasix and transitioned to torsemide 40 mg twice daily. Discharged on 11/21/18 to home on oxygen.    She had RHC/LHC in 01/2019, most significant stenosis was 75% ostial D1.  There was mild to moderate nonobstructive disease in other vessels. There was not an interventional target.  Filling pressures were mildly elevated with preserved cardiac output.  Torsemide was increased.    Echo in 03/2019 showed that EF remained 30-35% with wall motion abnormalities, RV appeared normal.   Medtronic ICD placed in 04/2019.    She was lost to follow up since 11/2019. Returned 05/2021 and had been off metolazone for several months. She was mildly SOB walking around her house. Repeat echo and paramedicine arranged.   Echo in  07/2021 showed EF 30-35%, dyskinetic apex, severe LV dilation, normal RV size and systolic function.   Admitted 09/2021 with AKI, given IVF fluids. GDMT held. SCr improved and Bidil, torsemide and spironolactone restarted. Remained off Entresto. Bisoprolol stopped due to bradycardia and junctional rhythm. Discharged home, weight 332 lbs.   Returned to [facility] for post hospital HF follow up with her boyfriend on 10/22/21. Overall was feeling fine. Had body aches that week and had taken an Entresto tablet, says she felt better. She continues on 2L oxygen at home, no significant dyspnea walking around the house. Reported using a wheelchair when out of the house. Denied CP, palpitations, dizziness, edema, or PND/Orthopnea. Appetite was ok. No fever or chills. Reported that she does not weigh at home. Reported taking all medications. Wanted to restart Ozempic.   Today she returns to HF clinic for pharmacist medication titration. At last visit with APP, no medication changes were made. Overall she is feeling ok today. Main complaint is nerve pain in her feet. Continues on 2L oxygen at home, no significant dyspnea walking around the house. No dizziness or lightheadedness. No CP or palpitations. Weight has been stable. No LEE, PND or orthopnea. She self restarted Entresto 1/2 tablet BID because she "feels better when she takes it".     HF Medications: Spironolactone 25 mg daily Jardiance 10 mg daily Bidil  20-37.5 mg - 1 tablet TID Torsemide 100 mg QAM/50 mg QPM  Has the patient been experiencing any side effects to the medications prescribed?  no  Does the patient have any problems obtaining medications due to transportation or finances?   no  Understanding of regimen: good Understanding of indications: good Potential of compliance: good Patient understands to avoid NSAIDs. Patient understands to avoid decongestants.    Pertinent Lab Values: 10/22/21: Serum creatinine 1.83, BUN 17, Potassium 3.8,  Sodium 137, BNP 78.5  Vital Signs: Weight: 328.8 lbs (last clinic weight: 328.6 lbs) Blood pressure: 146/64  Heart rate: 88   Assessment/Plan: 1. H/O PEA arrest: Due to severe hypoxemia in 09/2018 in setting of CHF exacerbation.   2. Chronic systolic CHF: Suspect ischemic cardiomyopathy.  Medtronic ICD.  Echo in 05/2018 with EF 40-45%, RV moderately dilated, PASP 71 mmHg.  Echo in 09/2018 with lower EF, 30-35%, and mildly dilated RV.  Suspect significant RV failure, may be related to OHS/OSA and COPD. She was massively volume overloaded during 09/2018 admission and diuresed extensively.  RHC done 11/06/18 showed high cardiac output and minimally elevated filling pressures after diuresis.  PVR was not elevated, appeared to be high output PH.  RHC was done again in 01/2019, filling pressures were elevated with preserved cardiac output.  Echo in 03/2019 showed EF still 30-35%.  Echo in 07/2021 with EF 30-35% RV normal.  - Stable NYHA III symptoms, limited by body habitus and OHS. She is not volume overloaded today. - Continue torsemide 100 mg /50 qpm.   - Continue Jardiance 10 mg daily. No GU symptoms.    - Continue Bidil 1 tab TID.   - Continue spironolactone 25 mg daily. - Start Entresto 24/26 mg BID. Repeat BMET in 2 weeks with PCP.  - Off bisoprolol with bradycardia and junctional rhythm. Will not add back today. - She is enrolled in monthly ICM for volume management. 3. CKD: Stage III. She sees Dr. Royce Macadamia.  4. COPD: On home oxygen at baseline.  Has quit smoking.  5. OHS/OSA: On home oxygen at all times and wears Trilogy at night. She is followed by Pulmonary. 6. CAD: History of PCI, most recently had inferior STEMI in 09/2013 with DES to RCA.  Coronary angiography was done in 01/2019, there was 75% ostial D1 stenosis but no good target for PCI. No chest pain.  - Continue ASA 81 and Crestor 40 daily. Good lipids in 07/2021.  7. Remote PE: She has not been anticoagulated.  8. Anemia: History of GI  bleeding, had endoscopies earlier in 2021 with no bleeding source found.  Also with history of B12 deficiency. She has seen Dr. Lorenso Courier for this. 9. Pulmonary hypertension: Pulmonary venous hypertension on last RHC in 01/2019.  10. Obesity: Body mass index is 50.71 kg/m. We discussed diet and exercise.  - Ozempic stopped during recent admission, ? If nausea/vomiting that precipitated dehydration/AKI related to this or constipation. She had been on med for 4 months prior with no issues. - Defer restarting semaglutide to Dr. Dwyane Dee. Has follow up next week.  11. Atrial fibrillation: No recent runs.   Follow up 8 weeks with Dr. Rush Farmer, PharmD, BCPS, [facility], [facility] Pharmacist [phone]

## 2021-11-04 NOTE — Progress Notes (Signed)
Remote ICD transmission.   

## 2021-11-06 ENCOUNTER — Other Ambulatory Visit: Payer: Self-pay | Admitting: Internal Medicine

## 2021-11-09 ENCOUNTER — Ambulatory Visit (INDEPENDENT_AMBULATORY_CARE_PROVIDER_SITE_OTHER): Payer: Medicaid Other

## 2021-11-09 DIAGNOSIS — Z95810 Presence of automatic (implantable) cardiac defibrillator: Secondary | ICD-10-CM | POA: Diagnosis not present

## 2021-11-09 DIAGNOSIS — I5022 Chronic systolic (congestive) heart failure: Secondary | ICD-10-CM | POA: Diagnosis not present

## 2021-11-10 ENCOUNTER — Ambulatory Visit (HOSPITAL_COMMUNITY)
Admission: RE | Admit: 2021-11-10 | Discharge: 2021-11-10 | Disposition: A | Discharge status: Home / Self Care | Payer: Medicaid Other | Source: Ambulatory Visit | Attending: Internal Medicine | Admitting: Internal Medicine

## 2021-11-10 VITALS — BP 146/64 | HR 88 | Wt 328.8 lb

## 2021-11-10 DIAGNOSIS — Z6841 Body Mass Index (BMI) 40.0 and over, adult: Secondary | ICD-10-CM | POA: Diagnosis not present

## 2021-11-10 DIAGNOSIS — I130 Hypertensive heart and chronic kidney disease with heart failure and stage 1 through stage 4 chronic kidney disease, or unspecified chronic kidney disease: Secondary | ICD-10-CM | POA: Diagnosis not present

## 2021-11-10 DIAGNOSIS — Z7901 Long term (current) use of anticoagulants: Secondary | ICD-10-CM | POA: Insufficient documentation

## 2021-11-10 DIAGNOSIS — N183 Chronic kidney disease, stage 3 unspecified: Secondary | ICD-10-CM | POA: Diagnosis not present

## 2021-11-10 DIAGNOSIS — Z87891 Personal history of nicotine dependence: Secondary | ICD-10-CM | POA: Insufficient documentation

## 2021-11-10 DIAGNOSIS — Z9981 Dependence on supplemental oxygen: Secondary | ICD-10-CM | POA: Insufficient documentation

## 2021-11-10 DIAGNOSIS — Z86711 Personal history of pulmonary embolism: Secondary | ICD-10-CM | POA: Diagnosis not present

## 2021-11-10 DIAGNOSIS — Z79899 Other long term (current) drug therapy: Secondary | ICD-10-CM | POA: Diagnosis not present

## 2021-11-10 DIAGNOSIS — I252 Old myocardial infarction: Secondary | ICD-10-CM | POA: Diagnosis not present

## 2021-11-10 DIAGNOSIS — I2510 Atherosclerotic heart disease of native coronary artery without angina pectoris: Secondary | ICD-10-CM | POA: Insufficient documentation

## 2021-11-10 DIAGNOSIS — Z7984 Long term (current) use of oral hypoglycemic drugs: Secondary | ICD-10-CM | POA: Insufficient documentation

## 2021-11-10 DIAGNOSIS — Z7982 Long term (current) use of aspirin: Secondary | ICD-10-CM | POA: Insufficient documentation

## 2021-11-10 DIAGNOSIS — E669 Obesity, unspecified: Secondary | ICD-10-CM | POA: Insufficient documentation

## 2021-11-10 DIAGNOSIS — G4733 Obstructive sleep apnea (adult) (pediatric): Secondary | ICD-10-CM | POA: Insufficient documentation

## 2021-11-10 DIAGNOSIS — E1122 Type 2 diabetes mellitus with diabetic chronic kidney disease: Secondary | ICD-10-CM | POA: Diagnosis not present

## 2021-11-10 DIAGNOSIS — I272 Pulmonary hypertension, unspecified: Secondary | ICD-10-CM | POA: Insufficient documentation

## 2021-11-10 DIAGNOSIS — I5022 Chronic systolic (congestive) heart failure: Secondary | ICD-10-CM | POA: Diagnosis present

## 2021-11-10 DIAGNOSIS — E785 Hyperlipidemia, unspecified: Secondary | ICD-10-CM | POA: Diagnosis not present

## 2021-11-10 DIAGNOSIS — D631 Anemia in chronic kidney disease: Secondary | ICD-10-CM | POA: Insufficient documentation

## 2021-11-10 MED ORDER — SACUBITRIL-VALSARTAN 24-26 MG PO TABS: 1.0000 | ORAL_TABLET | Freq: Two times a day (BID) | ORAL | 3 refills | Status: DC

## 2021-11-10 NOTE — Patient Instructions (Signed)
It was a pleasure seeing you today!  MEDICATIONS: -We are changing your medications today -Start Entresto 24/26 mg (1 tablet) twice daily -Call if you have questions about your medications.   NEXT APPOINTMENT: Return to clinic in 2 months with Dr. Aundra Dubin.  In general, to take care of your heart failure: -Limit your fluid intake to 2 Liters (half-gallon) per day.   -Limit your salt intake to ideally 2-3 grams (2000-3000 mg) per day. -Weigh yourself daily and record, and bring that "weight diary" to your next appointment.  (Weight gain of 2-3 pounds in 1 day typically means fluid weight.) -The medications for your heart are to help your heart and help you live longer.   -Please contact us before stopping any of your heart medications.  Call the clinic at [phone] with questions or to reschedule future appointments.

## 2021-11-12 ENCOUNTER — Other Ambulatory Visit (INDEPENDENT_AMBULATORY_CARE_PROVIDER_SITE_OTHER): Payer: Medicaid Other

## 2021-11-12 ENCOUNTER — Telehealth: Payer: Self-pay

## 2021-11-12 DIAGNOSIS — E1165 Type 2 diabetes mellitus with hyperglycemia: Secondary | ICD-10-CM | POA: Diagnosis not present

## 2021-11-12 DIAGNOSIS — Z794 Long term (current) use of insulin: Secondary | ICD-10-CM

## 2021-11-12 LAB — GLUCOSE, RANDOM: Glucose, Bld: 125 mg/dL — ABNORMAL HIGH (ref 70–99)

## 2021-11-12 LAB — HEMOGLOBIN A1C: Hgb A1c MFr Bld: 7.3 % — ABNORMAL HIGH (ref 4.6–6.5)

## 2021-11-12 NOTE — Telephone Encounter (Signed)
Patient came in for labs and states that her toes on left foot go numb and tingle from time to time. Also, her hands cramps really bad when she takes Torsemide.

## 2021-11-13 NOTE — Telephone Encounter (Signed)
Patient notified. She will reach out to them

## 2021-11-13 NOTE — Progress Notes (Signed)
EPIC Encounter for ICM Monitoring  Patient Name: Tamara Silva is a 61 y.o. female Date: 11/13/2021 Primary Care Physican: [place], Blondell Reveal, MD Primary Cardiologist: Aundra Dubin Electrophysiologist: Curt Bears 10/22/2021 Weight:  328 lbs                                                            Transmission reviewed.    Optivol thoracic impedance suggesting fluid level trending close to baseline.    Prescribed:  Torsemide 100 mg take 1 tablet (100 mg total) by mouth every morning and 0.5 tablet (50 mg total) every evening  Potassium 20 mEq take 1 tablet (20 mEq total) by mouth daily. Spironolactone 25 mg take 1 tablet daily Jardiance 10 mg take 1 tablet daily before breakfast   Labs: 10/22/2021 Creatinine 1.83, BUN 17, Potassium 3.8, Sodium 137, GFR 31 10/17/2021 Creatinine 1.81, BUN 39, Potassium 4.7, Sodium 142, GFR 31  10/16/2021 Creatinine 2.59, BUN 62, Potassium 4.4, Sodium 137, GFR 20  10/15/2021 Creatinine 2.32, BUN 54, Potassium 4.2, Sodium 136, GFR 23  10/14/2021 Creatinine 2.52, BUN 55, Potassium 4.5, Sodium 1339, GFR 21  10/13/2021 Creatinine 1.73, BUN 39, Potassium 5.2, Sodium 138, GFR 33  10/01/2021 Creatinine 1.96, BUN 32, Potassium 4.6, Sodium 142, GFR 29 09/14/2021 Creatinine 1.88, BUN 34, Potassium 4.1, Sodium 142, GFR 30 09/02/2021 Creatinine 2.19, BUN 38, Potassium 3.8, Sodium 141, GFR 25 07/29/2021 Creatinine 2.33, BUN 58, Potassium 4.4, Sodium 137  07/28/2021 Creatinine 2.20, BUN 50, Potassium 4.1, Sodium 139, GFR 25  06/04/2021 Creatinine 2.32, BUN 33, Potassium 4.0, Sodium 138, GFR 23  A complete set of results can be found in Results Review.   Recommendations:   No changes.    Follow-up plan: ICM clinic phone appointment on 12/14/2021.   91 day device clinic remote transmission 01/19/2022.     EP/Cardiology Office Visits: 01/05/2022 with Dr Aundra Dubin.   Copy of ICM check sent to Dr. Curt Bears.     3 month ICM trend: 11/09/2021.    12-14 Month ICM trend:      [name], RN 11/13/2021 1:21 PM

## 2021-11-14 LAB — FRUCTOSAMINE: Fructosamine: 264 umol/L (ref 0–285)

## 2021-11-17 ENCOUNTER — Encounter: Payer: Self-pay | Admitting: Endocrinology

## 2021-11-17 ENCOUNTER — Other Ambulatory Visit (HOSPITAL_COMMUNITY): Payer: Self-pay | Admitting: Adult Health

## 2021-11-17 ENCOUNTER — Ambulatory Visit: Payer: Medicaid Other | Admitting: Endocrinology

## 2021-11-17 DIAGNOSIS — K219 Gastro-esophageal reflux disease without esophagitis: Secondary | ICD-10-CM

## 2021-11-17 NOTE — Telephone Encounter (Signed)
error 

## 2021-11-19 ENCOUNTER — Telehealth: Payer: Self-pay | Admitting: Endocrinology

## 2021-11-19 ENCOUNTER — Other Ambulatory Visit (HOSPITAL_COMMUNITY): Payer: Self-pay | Admitting: Adult Health

## 2021-11-19 DIAGNOSIS — K219 Gastro-esophageal reflux disease without esophagitis: Secondary | ICD-10-CM

## 2021-11-19 NOTE — Telephone Encounter (Signed)
Patient dismissed from [facility] by Elayne Snare, MD, effective 11/17/21. Dismissal Letter sent out by 1st class mail. [employer]

## 2021-11-23 ENCOUNTER — Other Ambulatory Visit: Payer: Self-pay | Admitting: Endocrinology

## 2021-11-23 DIAGNOSIS — E1165 Type 2 diabetes mellitus with hyperglycemia: Secondary | ICD-10-CM

## 2021-11-24 ENCOUNTER — Ambulatory Visit (HOSPITAL_BASED_OUTPATIENT_CLINIC_OR_DEPARTMENT_OTHER): Payer: Self-pay | Admitting: Family Medicine

## 2021-12-03 ENCOUNTER — Other Ambulatory Visit (HOSPITAL_COMMUNITY): Payer: Self-pay | Admitting: Cardiology

## 2021-12-03 ENCOUNTER — Other Ambulatory Visit: Payer: Self-pay | Admitting: Pulmonary Disease

## 2021-12-08 ENCOUNTER — Other Ambulatory Visit: Payer: Self-pay | Admitting: Endocrinology

## 2021-12-08 DIAGNOSIS — E1165 Type 2 diabetes mellitus with hyperglycemia: Secondary | ICD-10-CM

## 2021-12-14 ENCOUNTER — Encounter: Payer: Self-pay | Admitting: Internal Medicine

## 2021-12-14 ENCOUNTER — Ambulatory Visit (INDEPENDENT_AMBULATORY_CARE_PROVIDER_SITE_OTHER): Payer: Medicaid Other

## 2021-12-14 ENCOUNTER — Telehealth: Payer: Self-pay | Admitting: Endocrinology

## 2021-12-14 ENCOUNTER — Ambulatory Visit: Payer: Medicaid Other | Attending: Internal Medicine | Admitting: Internal Medicine

## 2021-12-14 VITALS — BP 117/65 | HR 61 | Ht 67.5 in | Wt 321.8 lb

## 2021-12-14 DIAGNOSIS — J9611 Chronic respiratory failure with hypoxia: Secondary | ICD-10-CM

## 2021-12-14 DIAGNOSIS — Z7689 Persons encountering health services in other specified circumstances: Secondary | ICD-10-CM

## 2021-12-14 DIAGNOSIS — Z23 Encounter for immunization: Secondary | ICD-10-CM | POA: Diagnosis not present

## 2021-12-14 DIAGNOSIS — E662 Morbid (severe) obesity with alveolar hypoventilation: Secondary | ICD-10-CM

## 2021-12-14 DIAGNOSIS — E1159 Type 2 diabetes mellitus with other circulatory complications: Secondary | ICD-10-CM | POA: Diagnosis not present

## 2021-12-14 DIAGNOSIS — E1142 Type 2 diabetes mellitus with diabetic polyneuropathy: Secondary | ICD-10-CM

## 2021-12-14 DIAGNOSIS — J449 Chronic obstructive pulmonary disease, unspecified: Secondary | ICD-10-CM

## 2021-12-14 DIAGNOSIS — I5022 Chronic systolic (congestive) heart failure: Secondary | ICD-10-CM

## 2021-12-14 DIAGNOSIS — I152 Hypertension secondary to endocrine disorders: Secondary | ICD-10-CM

## 2021-12-14 DIAGNOSIS — Z9981 Dependence on supplemental oxygen: Secondary | ICD-10-CM

## 2021-12-14 DIAGNOSIS — K5901 Slow transit constipation: Secondary | ICD-10-CM

## 2021-12-14 DIAGNOSIS — Z95810 Presence of automatic (implantable) cardiac defibrillator: Secondary | ICD-10-CM

## 2021-12-14 DIAGNOSIS — Z794 Long term (current) use of insulin: Secondary | ICD-10-CM

## 2021-12-14 DIAGNOSIS — I2510 Atherosclerotic heart disease of native coronary artery without angina pectoris: Secondary | ICD-10-CM

## 2021-12-14 DIAGNOSIS — Z6841 Body Mass Index (BMI) 40.0 and over, adult: Secondary | ICD-10-CM

## 2021-12-14 MED ORDER — POLYETHYLENE GLYCOL 3350 17 G PO PACK: 17.0000 g | PACK | Freq: Every day | ORAL | 6 refills | Status: DC | PRN

## 2021-12-14 NOTE — Progress Notes (Signed)
Patient ID: Tamara Silva, female    DOB: 1961/04/13  MRN: [id number]  CC: Re-est care Subjective: Tamara Silva is a 61 y.o. female who presents for re-est care.  Husband is with her Her concerns today include:  Pt with hx of HTN, combined chronic CHF (EF30-35% 07/2021), cardiac arrest, CAD (followed by Dr. Marigene Ehlers), CVA, chronic PE, OHS ( onBiPAP), COPD, hypoxic respiratory failure on home O2 3 Lt continuous,  DM with neuropathy, obesity, CKD stage 3-4 (Dr. Harrie Jeans), former tob dep, IDA/ACD (history GI bleed with endoscopies 07/2018 - poor prep of colon on c-scope) Vit B12 def, polyclonal gammopathy (followed by Dr. Lorenso Courier).  She was see Dr. Kyung Rudd [place] because her insurance made the switch.  Decided to change back because our office is more conveniently located for her.    CHF/HTN/CAD Followed by Dr. Marigene Ehlers Currently taking: see medication list.  Current meds include BiDil 3 times daily, torsemide 100 mg / 50 mg, spironolactone 25 mg daily, Jardiance 10 mg daily, Entresto 24/26 mg twice a day. Did not take meds as yet for the morning.  Med Adherence: _0  Yes    _1  No Medication side effects: _2  Yes    _3  No Adherence with salt restriction: _4  Yes    _5  No Home Monitoring?: _6  Yes    _7  No but does have device Monitoring Frequency:  Home BP results range:  SOB? _8  Yes    _9  No Chest Pain?: _10  Yes    _11  No Leg swelling?: _12  Yes    _13  No Headaches?: _14  Yes -sometimes  Dizziness? _15  Yes    _16  No Comments:   DM: Lab Results  Component Value Date   HGBA1C 7.3 (H) 11/12/2021  She was seeing Dr. Dwyane Dee and the nutritionist.  Last seen 07/2021.  She was dismissed due to 4 no-shows; two due to transportation issues Most recent A1c is as above.  She was on Ozempic for 4 months but discontinued after recent hospitalization 09/2021 with nausea and acute on chronic renal insufficiency.  She tells me however that it was restarted last week; Dr. Dwyane Dee called in  prescription Currently on Tresiba 60-80 units daily (if BS over 100, take 80 units), Jardiance 10 mg and NovoLog PRN and Ozempic 2 mg Q wk -has [place] device.  In range 74% of time in last 7 days, 92% in the past 2 wks, 89% in last 30 days -loss 60 lbs over past  several mths. Reports Ozempic has decreased her appetite so she is not constantly eating as she was before -Endorses numbness in the feet left greater than right.  COPD/hypoxia:  On 3 Lt O2 contineously.  Uses Anoro daily Uses duo neb 4 x/day Followed by [facility]  OHS: using BIPAP consistently at nights; also uses during the day if she takes a nap.  She reports it helps a lot.  She wakes up in the mornings feeling refreshed.  CKD 3:  Saw Dr. Royce Macadamia last Friday.  Reports she is now at stage 3b.  No changes in meds  Requests refill on MiraLAX to use as needed for constipation.  HM: Due for Tdapt and Shingrix.  She would like to receive just the Tdap today.  Will start the Shingrix on her next visit.  Due for eye exam, sees Dr. Katy Fitch. Patient Active Problem List   Diagnosis Date Noted   Sinus bradycardia 10/16/2021   Nausea & vomiting 10/16/2021   AKI (acute kidney injury) ([place]) 10/14/2021  Class 3 obesity (HCC) 10/14/2021   NPDR (nonproliferative diabetic retinopathy) ([place]) 08/06/2020   Anemia of chronic disease 01/25/2020   Coronary artery disease involving native coronary artery of native heart with angina pectoris ([place]) 96/75/9163   Chronic systolic (congestive) heart failure ([place]) 04/04/2019   Diabetic peripheral neuropathy ([place]) 01/19/2019   Former smoker 01/19/2019   CKD (chronic kidney disease) stage 3, GFR 30-59 ml/min ([place]) 01/19/2019   Cardiac arrest ([place]) 10/28/2018   Noncompliance with treatment plan 07/24/2018   General patient noncompliance 07/24/2018   Noncompliance with diet and medication regimen 07/24/2018   Dysarthria    BiPAP (biphasic positive airway pressure) dependence    Type II diabetes  mellitus with renal manifestations ([place]) 07/08/2018   Hepatic steatosis 06/22/2018   Chronic respiratory failure with hypoxia and hypercapnia ([place])    On home O2    Slow transit constipation    Cocaine abuse ([place])    Diabetes mellitus type 2 in obese ([place])    Cerebral embolism with cerebral infarction 06/01/2018   Pressure injury of skin 05/30/2018   Iron deficiency anemia 01/27/2018   Tobacco abuse disorder 10/03/2017   Dyslipidemia 04/29/2016   Primary insomnia 04/29/2016   Obesity hypoventilation syndrome (HCC)    OSA (obstructive sleep apnea)    Dyslipidemia associated with type 2 diabetes mellitus ([place]) 05/21/2015   COPD GOLD III  06/10/2014   CAD S/P percutaneous coronary angioplasty - prior PCI to LAD; RCA PCI: new Xience [place] DES 2.75 mm x 15 mm  10/07/2013   Morbid obesity ([place]) 07/30/2013   Chronic combined systolic and diastolic heart failure ([place]) 07/10/2013   Bipolar disorder ([place]) 02/08/2013   Essential hypertension 02/08/2013   COPD (chronic obstructive pulmonary disease) ([place]) 10/15/2012     Current Outpatient Medications on File Prior to Visit  Medication Sig Dispense Refill   Semaglutide,0.25 or 0.5MG/DOS, (OZEMPIC, 0.25 OR 0.5 MG/DOSE,) 2 MG/1.5ML SOPN Inject into the skin.     ACCU-CHEK AVIVA PLUS test strip USE 1 STRIP TO CHECK BLOOD SUGAR 3 TIMES A DAY . 100 each 0   acetaminophen (TYLENOL) 500 MG tablet Take 1,000 mg by mouth every 6 (six) hours as needed for moderate pain or headache.     albuterol (PROVENTIL) (2.5 MG/3ML) 0.083% nebulizer solution Take 3 mLs (2.5 mg total) by nebulization every 6 (six) hours as needed for wheezing or shortness of breath. 360 mL 5   ascorbic acid (VITAMIN C) 500 MG tablet TAKE ONE TABLET BY MOUTH ONCE DAILY 90 tablet 3   ASPIRIN LOW DOSE 81 MG EC tablet CHEW 1 TABLET (81 MG TOTAL) BY MOUTH DAILY. 90 tablet 3   BIDIL 20-37.5 MG tablet TAKE 1 TABLET BY MOUTH 3 (THREE) TIMES DAILY. 270 tablet 3   bisacodyl (DULCOLAX) 5 MG EC  tablet Take 1 tablet (5 mg total) by mouth daily as needed for moderate constipation. 30 tablet 0   Blood Glucose Monitoring Suppl (ACCU-CHEK GUIDE) w/Device KIT Use accu chek guide to check blood sugar three times daily. 1 kit 0   Continuous Blood Gluc Sensor (FREESTYLE LIBRE 2 SENSOR) MISC USE ONE SENSOR EVERY 14 (FOURTEEN) DAYS. 2 each 0   D3-1000 25 MCG (1000 UT) tablet Take 1,000 Units by mouth 2 (two) times daily.     ENTRESTO 24-26 MG TAKE 1 TABLET BY MOUTH 2 (TWO) TIMES DAILY. 60 tablet 6   FEROSUL 325 (65 Fe) MG tablet TAKE 1 TABLET (325 MG TOTAL) BY MOUTH DAILY AFTER SUPPER. PLEASE TAKE WITH  A SOURCE OF VITAMIN C 90 tablet 3   fluticasone (FLONASE) 50 MCG/ACT nasal spray Place 1 spray into both nostrils daily. 16 g 6   hydrocerin (EUCERIN) CREA Apply 1 application topically 2 (two) times daily.  0   insulin degludec (TRESIBA FLEXTOUCH) 200 UNIT/ML FlexTouch Pen INJECT 60 UNITS UNDER THE SKIN ONCE DAILY 18 mL 3   Insulin Pen Needle (EASY COMFORT PEN NEEDLES) 31G X 5 MM MISC USE 3 TIMES A DAY FOR INSULIN ADMINISTRATION 100 each 12   ipratropium-albuterol (DUONEB) 0.5-2.5 (3) MG/3ML SOLN INHALE CONTENTS OF ONE VIAL USING NEBULIZER EVERY 6 HOURS AS NEEDED FOR SHORTNESS OF BREATH, WHEEZING 360 mL 5   JARDIANCE 10 MG TABS tablet TAKE ONE TABLET BY MOUTH ONCE DAILY BEFORE BREAKFAST 30 tablet 0   NEEDLE, DISP, 26 G 26G X 1/2" MISC 1 application by Does not apply route daily. 100 each 0   omeprazole (PRILOSEC) 40 MG capsule TAKE 1 CAPSULE (40 MG TOTAL) BY MOUTH DAILY. 30 capsule 0   RESTASIS 0.05 % ophthalmic emulsion Place 1 drop into both eyes daily as needed (dry eyes).     rosuvastatin (CRESTOR) 40 MG tablet TAKE 1 TABLET (40 MG TOTAL) BY MOUTH DAILY AT 6 PM. 90 tablet 0   senna-docusate (SENOKOT-S) 8.6-50 MG tablet Take 1 tablet by mouth 2 (two) times daily. 30 tablet 0   spironolactone (ALDACTONE) 25 MG tablet Take 25 mg by mouth daily.     torsemide (DEMADEX) 100 MG tablet Take 1 tablet  (100 mg total) by mouth in the morning AND 0.5 tablets (50 mg total) every evening. 45 tablet 3   umeclidinium-vilanterol (ANORO ELLIPTA) 62.5-25 MCG/ACT AEPB INHALE 1 PUFF INTO THE LUNGS DAILY. 60 each 0   vitamin B-12 (CYANOCOBALAMIN) 1000 MCG tablet Take 1,000 mcg by mouth daily.     No current facility-administered medications on file prior to visit.    Allergies  Allergen Reactions   Metolazone Other (See Comments)    Dizziness and falling Per patient in 2021, states she does not think she fell because of this medication.   Plavix [Clopidogrel Bisulfate] Other (See Comments)    High PRU's - non-responder   Ibuprofen Other (See Comments)    Pt states she is not supposed to take ibuprofen because of other meds she is taking   Nsaids Other (See Comments)    "I do not take NSAIDs d/t interfering with other meds"    Social History   Socioeconomic History   Marital status: Single    Spouse name: Not on file   Number of children: 1   Years of education: Not on file   Highest education level: Not on file  Occupational History   Not on file  Tobacco Use   Smoking status: Former    Packs/day: 0.50    Years: 20.00    Total pack years: 10.00    Types: Cigarettes    Quit date: 05/31/2018    Years since quitting: 3.5   Smokeless tobacco: Never   Tobacco comments:    smoked off and on x 20 years  Vaping Use   Vaping Use: Never used  Substance and Sexual Activity   Alcohol use: No    Alcohol/week: 0.0 standard drinks of alcohol   Drug use: Not Currently    Types: Cocaine   Sexual activity: Yes  Other Topics Concern   Not on file  Social History Narrative   Origibnally from [place]   Most recently from [place]  [place]   Daughter lives in town      On Social security to CHF, COPD, CAD   Social Determinants of Health   Financial Resource Strain: Medium Risk (01/13/2018)   Overall Financial Resource Strain (CARDIA)    Difficulty of Paying Living Expenses: Somewhat hard  Food  Insecurity: Food Insecurity Present (01/13/2018)   Hunger Vital Sign    Worried About Running Out of Food in the Last Year: Sometimes true    Ran Out of Food in the Last Year: Sometimes true  Transportation Needs: Unmet Transportation Needs (01/13/2018)   PRAPARE - Hydrologist (Medical): Yes    Lack of Transportation (Non-Medical): Yes  Physical Activity: Inactive (01/13/2018)   Exercise Vital Sign    Days of Exercise per Week: 0 days    Minutes of Exercise per Session: 0 min  Stress: Stress Concern Present (01/13/2018)   [place]    Feeling of Stress : Rather much  Social Connections: Moderately Integrated (01/13/2018)   Social Connection and Isolation Panel [NHANES]    Frequency of Communication with Friends and Family: Twice a week    Frequency of Social Gatherings with Friends and Family: Once a week    Attends Religious Services: 1 to 4 times per year    Active Member of [employer] or Organizations: No    Attends Archivist Meetings: Never    Marital Status: Living with partner  Intimate Partner Violence: Unknown (06/25/2018)   Humiliation, Afraid, Rape, and Kick questionnaire    Fear of Current or Ex-Partner: Patient refused    Emotionally Abused: Patient refused    Physically Abused: Patient refused    Sexually Abused: Patient refused    Family History  Problem Relation Age of Onset   Stroke Mother    Hypertension Mother    Colon cancer Father    Diabetes Father    Heart attack Father    Sarcoidosis Other    Breast cancer Paternal Aunt    Emphysema Brother    Lung disease Brother        Unknown type, 3 brothers, one with liver and lung disease   Diabetes Brother    Asthma Paternal Aunt    Diabetes Sister     Past Surgical History:  Procedure Laterality Date   BIOPSY  07/10/2018   Procedure: BIOPSY;  Surgeon: [name], MD;  Location: [facility];  Service:  Gastroenterology;;   COLONOSCOPY WITH PROPOFOL N/A 08/03/2013   Procedure: COLONOSCOPY WITH PROPOFOL;  Surgeon: Jeryl Columbia, MD;  Location: [facility];  Service: Endoscopy;  Laterality: N/A;   COLONOSCOPY WITH PROPOFOL N/A 07/10/2018   Procedure: COLONOSCOPY WITH PROPOFOL;  Surgeon: [name], MD;  Location: [facility];  Service: Gastroenterology;  Laterality: N/A;   CORONARY ANGIOPLASTY WITH STENT PLACEMENT     CAD in 2006 x 2 and 2009 2 more- place din REx in [place] [facility]   CORONARY ANGIOPLASTY WITH STENT PLACEMENT  10/07/2013   Xience Alpine DES 2.75  mm x 15  mm   ESOPHAGOGASTRODUODENOSCOPY (EGD) WITH PROPOFOL N/A 08/03/2013   Procedure: ESOPHAGOGASTRODUODENOSCOPY (EGD) WITH PROPOFOL;  Surgeon: Jeryl Columbia, MD;  Location: [facility];  Service: Endoscopy;  Laterality: N/A;   ESOPHAGOGASTRODUODENOSCOPY (EGD) WITH PROPOFOL N/A 07/10/2018   Procedure: ESOPHAGOGASTRODUODENOSCOPY (EGD) WITH PROPOFOL;  Surgeon: [name], MD;  Location: [facility];  Service: Gastroenterology;  Laterality: N/A;   ICD IMPLANT  04/04/2019  ICD IMPLANT N/A 04/04/2019   Procedure: ICD IMPLANT;  Surgeon: Constance Haw, MD;  Location: [facility];  Service: Cardiovascular;  Laterality: N/A;   LEFT HEART CATHETERIZATION WITH CORONARY ANGIOGRAM N/A 10/07/2013   Procedure: LEFT HEART CATHETERIZATION WITH CORONARY ANGIOGRAM;  Surgeon: Leonie Man, MD;  [facility];  Service: Cardiovascular;  Laterality: N/A;   RIGHT HEART CATH N/A 11/06/2018   Procedure: RIGHT HEART CATH;  Surgeon: Larey Dresser, MD;  Location: [facility];  Service: Cardiovascular;  Laterality: N/A;   RIGHT/LEFT HEART CATH AND CORONARY ANGIOGRAPHY N/A 02/12/2019   Procedure: RIGHT/LEFT HEART CATH AND CORONARY ANGIOGRAPHY;  Surgeon: Larey Dresser, MD;  Location: [facility];  Service: Cardiovascular;  Laterality: N/A;    ROS: Review of Systems Negative except as stated above  PHYSICAL  EXAM: BP 117/65   Pulse 61   Ht 5' 7.5" (1.715 m)   Wt (!) 321 lb 12.8 oz (146 kg)   LMP 05/30/2013   SpO2 98%   BMI 49.66 kg/m   Wt Readings from Last 3 Encounters:  12/14/21 (!) 321 lb 12.8 oz (146 kg)  11/10/21 (!) 328 lb 12.8 oz (149.1 kg)  10/22/21 (!) 328 lb 9.6 oz (149.1 kg)  Current [place] is on 3 LT O2 at rest.  Physical Exam  General appearance - alert, well appearing, and in no distress.  Pt in [place] and has O2 on. Mental status - normal mood, behavior, speech, dress, motor activity, and thought processes Mouth - mucous membranes moist, pharynx normal without lesions Neck - supple, no significant adenopathy Chest - clear to auscultation, no wheezes, rales or rhonchi, symmetric air entry Heart - normal rate, regular rhythm, normal S1, S2, no murmurs, rubs, clicks or gallops Extremities - trace LE edema BL Diabetic Foot Exam - Simple   Simple Foot Form Diabetic Foot exam was performed with the following findings: Yes 12/14/2021 12:38 PM  Visual Inspection See comments: Yes Sensation Testing See comments: Yes Pulse Check Posterior Tibialis and Dorsalis pulse intact bilaterally: Yes Comments Slightly flat-footed.  Decreased sensation on the heels of both feet on leap exam.         Latest Ref Rng & Units 11/12/2021    8:41 AM 10/22/2021    2:30 PM 10/17/2021    7:26 AM  CMP  Glucose 70 - 99 mg/dL 125  119  112   BUN 8 - 23 mg/dL  17  39   Creatinine 0.44 - 1.00 mg/dL  1.83  1.81   Sodium 135 - 145 mmol/L  137  142   Potassium 3.5 - 5.1 mmol/L  3.8  4.7   Chloride 98 - 111 mmol/L  101  111   CO2 22 - 32 mmol/L  26  23   Calcium 8.9 - 10.3 mg/dL  8.9  8.4    Lipid Panel     Component Value Date/Time   CHOL 128 07/28/2021 1526   TRIG 74 07/28/2021 1526   HDL 58 07/28/2021 1526   CHOLHDL 2.2 07/28/2021 1526   VLDL 15 07/28/2021 1526   LDLCALC 55 07/28/2021 1526    CBC    Component Value Date/Time   WBC 4.4 10/15/2021 0647   RBC 4.14 10/15/2021 0647   HGB  10.4 (L) 10/15/2021 0647   HGB 9.7 (L) 12/20/2019 0810   HGB 11.0 (L) 03/19/2019 1218   HCT 34.8 (L) 10/15/2021 0647   HCT 35.7 03/19/2019 1218   PLT  221 10/15/2021 0647   PLT 255 12/20/2019 0810   PLT 260 03/19/2019 1218   MCV 84.1 10/15/2021 0647   MCV 87 03/19/2019 1218   MCH 25.1 (L) 10/15/2021 0647   MCHC 29.9 (L) 10/15/2021 0647   RDW 18.6 (H) 10/15/2021 0647   RDW 12.7 03/19/2019 1218   LYMPHSABS 0.7 10/14/2021 0901   LYMPHSABS 3.4 (H) 11/30/2018 1346   MONOABS 0.3 10/14/2021 0901   EOSABS 0.0 10/14/2021 0901   EOSABS 0.2 11/30/2018 1346   BASOSABS 0.0 10/14/2021 0901   BASOSABS 0.1 11/30/2018 1346    ASSESSMENT AND PLAN: 1. Establishing care with new doctor, encounter for   2. Type 2 diabetes mellitus with diabetic polyneuropathy, with long-term current use of insulin (HCC) Blood sugars are doing good except for the past 1 week.  Advised patient that the goal is for blood sugars to be in the targeted range at least 80% of the times.  She will continue current dose of [place], Jardiance and Ozempic. Encourage healthy eating habits.  Encouraged her to move as much as she can. - Ambulatory referral to Ophthalmology  3. Hypertension associated with diabetes ([place]) At goal.  Continue current medications that include BiDil, spironolactone, Entresto.  4. Morbid obesity ([place]) See #2 above.  Commended her on weight loss so far on Ozempic.  We will continue Ozempic and continue to monitor.  5. Chronic systolic (congestive) heart failure (HCC) Compensated.  Continue BiDil 3 times a day, torsemide 100 mg in the morning/50 mg in the evening, spironolactone 25 mg daily, Jardiance 10 mg daily, Entresto twice a day  6. Obesity hypoventilation syndrome (HCC) Continue use of BiPAP at nights.  Patient benefiting from its use based on her history.  7. Chronic respiratory failure with hypoxia, on home oxygen therapy (HCC) Continue O2 3 L continuous.  8. Chronic obstructive pulmonary  disease, unspecified COPD type ([place]) Continue O2, Anoro inhaler and nebulizer treatments  9. Coronary artery disease involving native coronary artery of native heart without angina pectoris Stable.  Beta-blocker is on hold due to bradycardia  10. Slow transit constipation - polyethylene glycol (MIRALAX / GLYCOLAX) 17 g packet; Take 17 g by mouth daily as needed.  Dispense: 14 each; Refill: 6  11. Need for Tdap vaccination Given today. Patient agreeable to starting the Shingrix vaccine series on next visit.     Patient was given the opportunity to ask questions.  Patient verbalized understanding of the plan and was able to repeat key elements of the plan.   This documentation was completed using Radio producer.  Any transcriptional errors are unintentional.  Orders Placed This Encounter  Procedures   Tdap vaccine greater than or equal to 7yo IM   Ambulatory referral to Ophthalmology     Requested Prescriptions   Signed Prescriptions Disp Refills   polyethylene glycol (MIRALAX / GLYCOLAX) 17 g packet 14 each 6    Sig: Take 17 g by mouth daily as needed.    Return in about 4 weeks (around 01/11/2022) for PAP.  Karle Plumber, MD, FACP

## 2021-12-14 NOTE — Telephone Encounter (Signed)
Patient has been dismissed from [facility] and is requesting a referral to [facility] to [employer]

## 2021-12-15 ENCOUNTER — Other Ambulatory Visit: Payer: Self-pay | Admitting: Internal Medicine

## 2021-12-15 DIAGNOSIS — K219 Gastro-esophageal reflux disease without esophagitis: Secondary | ICD-10-CM

## 2021-12-15 NOTE — Telephone Encounter (Signed)
Medication Refill - Medication: omeprazole (PRILOSEC) 40 MG capsule  Has the patient contacted their pharmacy? Yes.   (Agent: If no, request that the patient contact the pharmacy for the refill. If patient does not wish to contact the pharmacy document the reason why and proceed with request.) (Agent: If yes, when and what did the pharmacy advise?)  Preferred Pharmacy (with phone number or street name):  [place], [place] - [address]  [place] [address]  Phone: [phone] Fax: [phone]   Has the patient been seen for an appointment in the last year OR does the patient have an upcoming appointment? Yes.    Agent: Please be advised that RX refills may take up to 3 business days. We ask that you follow-up with your pharmacy.

## 2021-12-16 MED ORDER — OMEPRAZOLE 40 MG PO CPDR: 40.0000 mg | DELAYED_RELEASE_CAPSULE | Freq: Every day | ORAL | 2 refills | Status: DC

## 2021-12-16 NOTE — Telephone Encounter (Signed)
Patient has now been informed and says that she has spoken with Dr. Wynetta Emery as well and the referral has been placed.

## 2021-12-16 NOTE — Telephone Encounter (Signed)
Requested medication (s) are due for refill today: yes  Requested medication (s) are on the active medication list: yes  Last refill:  11/30/21 #30  Future visit scheduled: no  Notes to clinic:   Dr Aundra Dubin attached  note to previous refill in July "refills need to come to PCP"   Requested Prescriptions  Pending Prescriptions Disp Refills   omeprazole (PRILOSEC) 40 MG capsule 30 capsule 0    Sig: Take 1 capsule (40 mg total) by mouth daily.     Gastroenterology: Proton Pump Inhibitors Passed - 12/15/2021  5:19 PM      Passed - Valid encounter within last 12 months    Recent Outpatient Visits           2 days ago Establishing care with new doctor, encounter for   [place] Karle Plumber B, MD   1 year ago Type 2 diabetes mellitus with diabetic polyneuropathy, with long-term current use of insulin [facility])   [place] Karle Plumber B, MD   2 years ago Chronic systolic CHF (congestive heart failure) [facility])   [place] Karle Plumber B, MD   2 years ago Chronic systolic CHF (congestive heart failure) [facility])   [place] Karle Plumber B, MD   2 years ago Uncontrolled type 2 diabetes mellitus with peripheral neuropathy [facility])   [place] [facility] Ladell Pier, MD

## 2021-12-17 ENCOUNTER — Encounter (HOSPITAL_BASED_OUTPATIENT_CLINIC_OR_DEPARTMENT_OTHER): Payer: Self-pay | Admitting: Internal Medicine

## 2021-12-17 ENCOUNTER — Encounter (HOSPITAL_BASED_OUTPATIENT_CLINIC_OR_DEPARTMENT_OTHER): Payer: Self-pay | Admitting: Family Medicine

## 2021-12-18 ENCOUNTER — Telehealth: Payer: Self-pay

## 2021-12-18 NOTE — Progress Notes (Signed)
EPIC Encounter for ICM Monitoring  Patient Name: Tamara Silva is a 61 y.o. female Date: 12/18/2021 Primary Care Physican: Ladell Pier, MD Primary Cardiologist: Aundra Dubin Electrophysiologist: Curt Bears 10/22/2021 Weight:  328 lbs 12/18/2021 Weight: 319 lbs                                                            Spoke with patient and heart failure questions reviewed.  Pt asymptomatic for fluid accumulation.  She is taking Ozempic and losing weight.     Optivol thoracic impedance suggesting fluid level trending close to baseline.    Prescribed:  Torsemide 100 mg take 1 tablet (100 mg total) by mouth every morning and 0.5 tablet (50 mg total) every evening  Potassium 20 mEq take 1 tablet (20 mEq total) by mouth daily. Spironolactone 25 mg take 1 tablet daily Jardiance 10 mg take 1 tablet daily before breakfast   Labs: 10/22/2021 Creatinine 1.83, BUN 17, Potassium 3.8, Sodium 137, GFR 31 10/17/2021 Creatinine 1.81, BUN 39, Potassium 4.7, Sodium 142, GFR 31  10/16/2021 Creatinine 2.59, BUN 62, Potassium 4.4, Sodium 137, GFR 20  10/15/2021 Creatinine 2.32, BUN 54, Potassium 4.2, Sodium 136, GFR 23  10/14/2021 Creatinine 2.52, BUN 55, Potassium 4.5, Sodium 1339, GFR 21  10/13/2021 Creatinine 1.73, BUN 39, Potassium 5.2, Sodium 138, GFR 33  10/01/2021 Creatinine 1.96, BUN 32, Potassium 4.6, Sodium 142, GFR 29 09/14/2021 Creatinine 1.88, BUN 34, Potassium 4.1, Sodium 142, GFR 30 09/02/2021 Creatinine 2.19, BUN 38, Potassium 3.8, Sodium 141, GFR 25 07/29/2021 Creatinine 2.33, BUN 58, Potassium 4.4, Sodium 137  07/28/2021 Creatinine 2.20, BUN 50, Potassium 4.1, Sodium 139, GFR 25  06/04/2021 Creatinine 2.32, BUN 33, Potassium 4.0, Sodium 138, GFR 23  A complete set of results can be found in Results Review.   Recommendations:   No changes and encouraged to call if experiencing any fluid symptoms.   Follow-up plan: ICM clinic phone appointment on 01/20/2022.   91 day device clinic remote  transmission 01/19/2022.     EP/Cardiology Office Visits: 01/05/2022 with Dr Aundra Dubin.   Copy of ICM check sent to Dr. Curt Bears.   3 month ICM trend: 12/14/2021.    12-14 Month ICM trend:     [name], RN 12/18/2021 2:32 PM

## 2021-12-18 NOTE — Telephone Encounter (Signed)
Remote ICM transmission received.  Attempted call to patient regarding ICM remote transmission and spoke with patient regarding remote transmission.

## 2021-12-21 ENCOUNTER — Other Ambulatory Visit: Payer: Self-pay | Admitting: Endocrinology

## 2021-12-21 DIAGNOSIS — E1165 Type 2 diabetes mellitus with hyperglycemia: Secondary | ICD-10-CM

## 2021-12-23 ENCOUNTER — Encounter: Payer: Self-pay | Admitting: Internal Medicine

## 2021-12-23 NOTE — Progress Notes (Signed)
Note received from nephrologist Dr. Kathrine Haddock.  Patient seen 12/11/2021 Diagnoses: CKD stage IIIb secondary to cardiorenal syndrome as well as DM nephropathy and microvascular disease from HTN.  Continue Jardiance and Entresto. Protein urea 2/2 DM: On Entresto and Jardiance Anemia of CKD: On oral iron.   Secondary hyperparathyroidism: Vitamin D deficiency and CKD.  Continue vitamin.

## 2022-01-05 ENCOUNTER — Encounter (HOSPITAL_COMMUNITY): Payer: Medicaid Other | Admitting: Cardiology

## 2022-01-19 ENCOUNTER — Ambulatory Visit (INDEPENDENT_AMBULATORY_CARE_PROVIDER_SITE_OTHER): Payer: Medicaid Other

## 2022-01-19 DIAGNOSIS — I5022 Chronic systolic (congestive) heart failure: Secondary | ICD-10-CM | POA: Diagnosis not present

## 2022-01-19 LAB — CUP PACEART REMOTE DEVICE CHECK
Battery Remaining Longevity: 119 mo
Battery Voltage: 3.02 V
Brady Statistic RV Percent Paced: 0.02 %
Date Time Interrogation Session: 20230822043624
HighPow Impedance: 65 Ohm
Implantable Lead Implant Date: 20201104
Implantable Lead Location: 753860
Implantable Pulse Generator Implant Date: 20201104
Lead Channel Impedance Value: 304 Ohm
Lead Channel Impedance Value: 380 Ohm
Lead Channel Pacing Threshold Amplitude: 0.5 V
Lead Channel Pacing Threshold Pulse Width: 0.4 ms
Lead Channel Sensing Intrinsic Amplitude: 15.875 mV
Lead Channel Sensing Intrinsic Amplitude: 15.875 mV
Lead Channel Setting Pacing Amplitude: 2.5 V
Lead Channel Setting Pacing Pulse Width: 0.4 ms
Lead Channel Setting Sensing Sensitivity: 0.3 mV

## 2022-01-22 ENCOUNTER — Ambulatory Visit (INDEPENDENT_AMBULATORY_CARE_PROVIDER_SITE_OTHER): Payer: Medicaid Other

## 2022-01-22 ENCOUNTER — Telehealth: Payer: Self-pay | Admitting: Emergency Medicine

## 2022-01-22 DIAGNOSIS — Z95810 Presence of automatic (implantable) cardiac defibrillator: Secondary | ICD-10-CM | POA: Diagnosis not present

## 2022-01-22 DIAGNOSIS — I5022 Chronic systolic (congestive) heart failure: Secondary | ICD-10-CM

## 2022-01-22 DIAGNOSIS — E1165 Type 2 diabetes mellitus with hyperglycemia: Secondary | ICD-10-CM

## 2022-01-22 NOTE — Telephone Encounter (Signed)
Copied from [place] [id number]. Topic: General - Other >> Jan 22, 2022 11:20 AM Ludger Nutting wrote: Patient would like to go back to original Endocrinologists at [place]. Patient was dismissed from practice because of missed appointments, but patient would like for Dr. Wynetta Emery to talk to the dr.Please advise patient.

## 2022-01-22 NOTE — Progress Notes (Signed)
EPIC Encounter for ICM Monitoring  Patient Name: Tamara Silva is a 61 y.o. female Date: 01/22/2022 Primary Care Physican: Ladell Pier, MD Primary Cardiologist: Aundra Dubin Electrophysiologist: Curt Bears 10/22/2021 Weight:  328 lbs 12/18/2021 Weight: 319 lbs                                                            Transmission reviewed.    Optivol thoracic impedance normal but was suggesting intermittent days of possible fluid accumulation within last month.     Prescribed:  Torsemide 100 mg take 1 tablet (100 mg total) by mouth every morning and 0.5 tablet (50 mg total) every evening  Potassium 20 mEq take 1 tablet (20 mEq total) by mouth daily. Spironolactone 25 mg take 1 tablet daily Jardiance 10 mg take 1 tablet daily before breakfast   Labs: 10/22/2021 Creatinine 1.83, BUN 17, Potassium 3.8, Sodium 137, GFR 31 10/17/2021 Creatinine 1.81, BUN 39, Potassium 4.7, Sodium 142, GFR 31  10/16/2021 Creatinine 2.59, BUN 62, Potassium 4.4, Sodium 137, GFR 20  10/15/2021 Creatinine 2.32, BUN 54, Potassium 4.2, Sodium 136, GFR 23  10/14/2021 Creatinine 2.52, BUN 55, Potassium 4.5, Sodium 1339, GFR 21  10/13/2021 Creatinine 1.73, BUN 39, Potassium 5.2, Sodium 138, GFR 33  10/01/2021 Creatinine 1.96, BUN 32, Potassium 4.6, Sodium 142, GFR 29 09/14/2021 Creatinine 1.88, BUN 34, Potassium 4.1, Sodium 142, GFR 30 09/02/2021 Creatinine 2.19, BUN 38, Potassium 3.8, Sodium 141, GFR 25 07/29/2021 Creatinine 2.33, BUN 58, Potassium 4.4, Sodium 137  07/28/2021 Creatinine 2.20, BUN 50, Potassium 4.1, Sodium 139, GFR 25  06/04/2021 Creatinine 2.32, BUN 33, Potassium 4.0, Sodium 138, GFR 23  A complete set of results can be found in Results Review.   Recommendations:   No changes.   Follow-up plan: ICM clinic phone appointment on 02/22/2022.   91 day device clinic remote transmission 04/20/2022.     EP/Cardiology Office Visits:  Canceled 01/05/2022 with Dr Aundra Dubin and not rescheduled.  Has not seen Dr  Curt Bears since 04/04/2019 devic implant.  Message sent to EP scheduler 8/25 to call patient to schedule appointment.    Copy of ICM check sent to Dr. Curt Bears.   3 month ICM trend: 01/21/2022.    12-14 Month ICM trend:     [name], RN 01/22/2022 4:38 PM

## 2022-01-27 NOTE — Telephone Encounter (Signed)
LVM for pt to RTC.----DD,RMA

## 2022-01-27 NOTE — Telephone Encounter (Signed)
Please advise.------DD,RMA

## 2022-01-28 ENCOUNTER — Other Ambulatory Visit (HOSPITAL_COMMUNITY): Payer: Self-pay | Admitting: Cardiology

## 2022-01-28 ENCOUNTER — Other Ambulatory Visit: Payer: Self-pay | Admitting: Endocrinology

## 2022-01-28 DIAGNOSIS — I25119 Atherosclerotic heart disease of native coronary artery with unspecified angina pectoris: Secondary | ICD-10-CM

## 2022-01-29 ENCOUNTER — Telehealth: Payer: Self-pay

## 2022-01-29 ENCOUNTER — Other Ambulatory Visit (HOSPITAL_COMMUNITY): Payer: Self-pay

## 2022-01-29 ENCOUNTER — Other Ambulatory Visit: Payer: Self-pay

## 2022-01-29 MED ORDER — FREESTYLE LIBRE 2 SENSOR MISC: 6 refills | Status: DC

## 2022-01-29 MED ORDER — OZEMPIC (0.25 OR 0.5 MG/DOSE) 2 MG/1.5ML ~~LOC~~ SOPN: 2.0000 mg | PEN_INJECTOR | SUBCUTANEOUS | 5 refills | Status: DC

## 2022-01-29 NOTE — Addendum Note (Signed)
Addended by: Karle Plumber B on: 01/29/2022 12:34 PM   Modules accepted: Orders

## 2022-01-29 NOTE — Telephone Encounter (Signed)
Ozempic PA approved, pharmacy is aware.

## 2022-01-29 NOTE — Telephone Encounter (Signed)
Pt agrees to new referral to Endo provider and states it doesn't matter which office. As she would have loved to keep Dr. Dwyane Dee she understands.   Also pt states that she is currently out of her Ozempic '2mg'$  injection (hasn't had Rx fr 2wks) & doesn't have any more Theressa Stamps buttons ( skin monitor's) left to attach to her arm.   Pt was wanting to know if ok for pcp to send emergency Rx until she can get int office w/ Endocrine provider?    Informed pt request will be sent to pcp. Pt expressed understanding. Please advise.------DD,RMA

## 2022-02-02 ENCOUNTER — Other Ambulatory Visit: Payer: Self-pay | Admitting: Endocrinology

## 2022-02-02 DIAGNOSIS — E1165 Type 2 diabetes mellitus with hyperglycemia: Secondary | ICD-10-CM

## 2022-02-02 NOTE — Telephone Encounter (Signed)
Pt was called and VM was left informing patient that script has been sent.

## 2022-02-08 ENCOUNTER — Other Ambulatory Visit: Payer: Medicaid Other

## 2022-02-11 ENCOUNTER — Ambulatory Visit: Payer: Medicaid Other | Admitting: Endocrinology

## 2022-02-16 NOTE — Progress Notes (Signed)
Remote ICD transmission.   

## 2022-02-23 ENCOUNTER — Other Ambulatory Visit: Payer: Self-pay | Admitting: Hematology and Oncology

## 2022-02-24 ENCOUNTER — Telehealth: Payer: Self-pay

## 2022-02-24 NOTE — Telephone Encounter (Signed)
LMOVM for patient to send missed ICM transmission. 

## 2022-03-01 NOTE — Progress Notes (Signed)
No ICM remote transmission received for 02/22/2022 and next ICM transmission scheduled for 03/22/2022.

## 2022-03-08 ENCOUNTER — Other Ambulatory Visit: Payer: Self-pay | Admitting: Internal Medicine

## 2022-03-08 ENCOUNTER — Telehealth: Payer: Self-pay | Admitting: Emergency Medicine

## 2022-03-08 ENCOUNTER — Ambulatory Visit: Payer: Medicaid Other | Admitting: Cardiology

## 2022-03-08 DIAGNOSIS — I25119 Atherosclerotic heart disease of native coronary artery with unspecified angina pectoris: Secondary | ICD-10-CM

## 2022-03-08 DIAGNOSIS — E1165 Type 2 diabetes mellitus with hyperglycemia: Secondary | ICD-10-CM

## 2022-03-08 NOTE — Telephone Encounter (Signed)
Copied from [place] [id number]. Topic: Referral - Request for Referral >> Mar 08, 2022  9:27 AM Everette C wrote: Has patient seen PCP for this complaint? Yes.   *If NO, is insurance requiring patient see PCP for this issue before PCP can refer them? Referral for which specialty: Endocrinology  Preferred provider/office: The patient would like an office that does not require them to use a computer Reason for referral: medication continuity

## 2022-03-10 NOTE — Telephone Encounter (Signed)
Please advise 

## 2022-03-10 NOTE — Telephone Encounter (Signed)
Pt informed of note per pcp. Pt expressed understanding.----DD,RMA

## 2022-03-11 ENCOUNTER — Other Ambulatory Visit: Payer: Self-pay | Admitting: Internal Medicine

## 2022-03-11 DIAGNOSIS — K219 Gastro-esophageal reflux disease without esophagitis: Secondary | ICD-10-CM

## 2022-03-12 ENCOUNTER — Other Ambulatory Visit (HOSPITAL_COMMUNITY): Payer: Self-pay | Admitting: Cardiology

## 2022-03-12 DIAGNOSIS — J302 Other seasonal allergic rhinitis: Secondary | ICD-10-CM

## 2022-03-16 ENCOUNTER — Other Ambulatory Visit: Payer: Self-pay | Admitting: Pulmonary Disease

## 2022-03-16 ENCOUNTER — Other Ambulatory Visit: Payer: Self-pay | Admitting: Internal Medicine

## 2022-03-16 ENCOUNTER — Other Ambulatory Visit: Payer: Self-pay | Admitting: Endocrinology

## 2022-03-16 DIAGNOSIS — I25119 Atherosclerotic heart disease of native coronary artery with unspecified angina pectoris: Secondary | ICD-10-CM

## 2022-03-17 ENCOUNTER — Telehealth: Payer: Self-pay

## 2022-03-17 NOTE — Telephone Encounter (Signed)
Returned call to patient as requested per voice mail message.  She asked if she would need to come to the office for 10/23 Home remote appointment.  Advised she does not come to the office for home remote transmission appointments.

## 2022-03-17 NOTE — Telephone Encounter (Signed)
Patient called back regarding appointments.  Attempted to assist with sending remote transmission and received error code [id number].  Provided [facility] tech support number and advised to call for monitor assistance.

## 2022-03-18 ENCOUNTER — Other Ambulatory Visit: Payer: Self-pay | Admitting: Endocrinology

## 2022-03-18 DIAGNOSIS — I25119 Atherosclerotic heart disease of native coronary artery with unspecified angina pectoris: Secondary | ICD-10-CM

## 2022-03-22 ENCOUNTER — Ambulatory Visit (INDEPENDENT_AMBULATORY_CARE_PROVIDER_SITE_OTHER): Payer: Medicaid Other

## 2022-03-22 DIAGNOSIS — I5022 Chronic systolic (congestive) heart failure: Secondary | ICD-10-CM

## 2022-03-22 DIAGNOSIS — Z95810 Presence of automatic (implantable) cardiac defibrillator: Secondary | ICD-10-CM

## 2022-03-23 NOTE — Progress Notes (Signed)
EPIC Encounter for ICM Monitoring  Patient Name: Tamara Silva is a 61 y.o. female Date: 03/23/2022 Primary Care Physican: Tamara Pier, MD Primary Cardiologist: Tamara Silva Electrophysiologist: Tamara Silva 10/22/2021 Weight:  328 lbs 12/18/2021 Weight: 319 lbs                                                            Spoke with patient and heart failure questions reviewed.  Transmission results reviewed.  Pt asymptomatic for fluid accumulation.  Reports feeling well at this time and voices no complaints.     Optivol thoracic impedance suggesting possible fluid accumulation starting 9/26 and returned to normal 10/23.     Prescribed:  Torsemide 100 mg take 1 tablet (100 mg total) by mouth every morning and 0.5 tablet (50 mg total) every evening  Spironolactone 25 mg take 1 tablet daily Jardiance 10 mg take 1 tablet daily before breakfast   Labs: 10/22/2021 Creatinine 1.83, BUN 17, Potassium 3.8, Sodium 137, GFR 31 10/17/2021 Creatinine 1.81, BUN 39, Potassium 4.7, Sodium 142, GFR 31  10/16/2021 Creatinine 2.59, BUN 62, Potassium 4.4, Sodium 137, GFR 20  10/15/2021 Creatinine 2.32, BUN 54, Potassium 4.2, Sodium 136, GFR 23  10/14/2021 Creatinine 2.52, BUN 55, Potassium 4.5, Sodium 1339, GFR 21  10/13/2021 Creatinine 1.73, BUN 39, Potassium 5.2, Sodium 138, GFR 33  10/01/2021 Creatinine 1.96, BUN 32, Potassium 4.6, Sodium 142, GFR 29 09/14/2021 Creatinine 1.88, BUN 34, Potassium 4.1, Sodium 142, GFR 30 09/02/2021 Creatinine 2.19, BUN 38, Potassium 3.8, Sodium 141, GFR 25 07/29/2021 Creatinine 2.33, BUN 58, Potassium 4.4, Sodium 137  07/28/2021 Creatinine 2.20, BUN 50, Potassium 4.1, Sodium 139, GFR 25  06/04/2021 Creatinine 2.32, BUN 33, Potassium 4.0, Sodium 138, GFR 23  A complete set of results can be found in Results Review.   Recommendations:   No changes and encouraged to call if experiencing any fluid symptoms.   Follow-up plan: ICM clinic phone appointment on 04/12/2022 to  recheck fluid levels.   91 day device clinic remote transmission 04/20/2022.     EP/Cardiology Office Visits:  03/29/2022 with Dr Tamara Silva.  03/29/2022 with Dr Tamara Silva.   Copy of ICM check sent to Dr. Curt Silva.    3 month ICM trend: 03/22/2022.    12-14 Month ICM trend:     Tamara Billings, RN 03/23/2022 8:02 AM

## 2022-03-27 ENCOUNTER — Other Ambulatory Visit: Payer: Self-pay | Admitting: Pulmonary Disease

## 2022-03-27 DIAGNOSIS — J449 Chronic obstructive pulmonary disease, unspecified: Secondary | ICD-10-CM

## 2022-03-29 ENCOUNTER — Ambulatory Visit (HOSPITAL_COMMUNITY)
Admission: RE | Admit: 2022-03-29 | Discharge: 2022-03-29 | Disposition: A | Discharge status: Home / Self Care | Payer: Medicaid Other | Source: Ambulatory Visit | Attending: Cardiology | Admitting: Cardiology

## 2022-03-29 ENCOUNTER — Other Ambulatory Visit (HOSPITAL_COMMUNITY): Payer: Self-pay

## 2022-03-29 ENCOUNTER — Ambulatory Visit (INDEPENDENT_AMBULATORY_CARE_PROVIDER_SITE_OTHER): Payer: Medicaid Other | Admitting: Cardiology

## 2022-03-29 ENCOUNTER — Encounter (HOSPITAL_COMMUNITY): Payer: Self-pay | Admitting: Cardiology

## 2022-03-29 ENCOUNTER — Encounter: Payer: Self-pay | Admitting: Cardiology

## 2022-03-29 VITALS — BP 118/76 | HR 84 | Ht 67.5 in | Wt 324.0 lb

## 2022-03-29 VITALS — BP 120/60 | HR 65 | Wt 324.0 lb

## 2022-03-29 DIAGNOSIS — E1165 Type 2 diabetes mellitus with hyperglycemia: Secondary | ICD-10-CM | POA: Diagnosis not present

## 2022-03-29 DIAGNOSIS — Z95810 Presence of automatic (implantable) cardiac defibrillator: Secondary | ICD-10-CM | POA: Insufficient documentation

## 2022-03-29 DIAGNOSIS — I130 Hypertensive heart and chronic kidney disease with heart failure and stage 1 through stage 4 chronic kidney disease, or unspecified chronic kidney disease: Secondary | ICD-10-CM | POA: Insufficient documentation

## 2022-03-29 DIAGNOSIS — I5042 Chronic combined systolic (congestive) and diastolic (congestive) heart failure: Secondary | ICD-10-CM

## 2022-03-29 DIAGNOSIS — Z794 Long term (current) use of insulin: Secondary | ICD-10-CM

## 2022-03-29 DIAGNOSIS — I2510 Atherosclerotic heart disease of native coronary artery without angina pectoris: Secondary | ICD-10-CM | POA: Diagnosis not present

## 2022-03-29 DIAGNOSIS — E785 Hyperlipidemia, unspecified: Secondary | ICD-10-CM | POA: Insufficient documentation

## 2022-03-29 DIAGNOSIS — D649 Anemia, unspecified: Secondary | ICD-10-CM | POA: Insufficient documentation

## 2022-03-29 DIAGNOSIS — N183 Chronic kidney disease, stage 3 unspecified: Secondary | ICD-10-CM | POA: Insufficient documentation

## 2022-03-29 DIAGNOSIS — E1122 Type 2 diabetes mellitus with diabetic chronic kidney disease: Secondary | ICD-10-CM | POA: Diagnosis not present

## 2022-03-29 DIAGNOSIS — I5022 Chronic systolic (congestive) heart failure: Secondary | ICD-10-CM

## 2022-03-29 DIAGNOSIS — Z8673 Personal history of transient ischemic attack (TIA), and cerebral infarction without residual deficits: Secondary | ICD-10-CM | POA: Diagnosis not present

## 2022-03-29 DIAGNOSIS — I252 Old myocardial infarction: Secondary | ICD-10-CM | POA: Diagnosis not present

## 2022-03-29 DIAGNOSIS — Z7984 Long term (current) use of oral hypoglycemic drugs: Secondary | ICD-10-CM | POA: Diagnosis not present

## 2022-03-29 DIAGNOSIS — G4733 Obstructive sleep apnea (adult) (pediatric): Secondary | ICD-10-CM | POA: Insufficient documentation

## 2022-03-29 DIAGNOSIS — J4489 Other specified chronic obstructive pulmonary disease: Secondary | ICD-10-CM | POA: Diagnosis not present

## 2022-03-29 LAB — BASIC METABOLIC PANEL
Anion gap: 9 (ref 5–15)
BUN: 30 mg/dL — ABNORMAL HIGH (ref 8–23)
CO2: 32 mmol/L (ref 22–32)
Calcium: 9.3 mg/dL (ref 8.9–10.3)
Chloride: 101 mmol/L (ref 98–111)
Creatinine, Ser: 1.63 mg/dL — ABNORMAL HIGH (ref 0.44–1.00)
GFR, Estimated: 35 mL/min — ABNORMAL LOW (ref >60)
Glucose, Bld: 113 mg/dL — ABNORMAL HIGH (ref 70–99)
Potassium: 3.7 mmol/L (ref 3.5–5.1)
Sodium: 142 mmol/L (ref 135–145)

## 2022-03-29 LAB — BRAIN NATRIURETIC PEPTIDE: B Natriuretic Peptide: 88.5 pg/mL (ref 0.0–100.0)

## 2022-03-29 MED ORDER — EMPAGLIFLOZIN 10 MG PO TABS
10.0000 mg | ORAL_TABLET | Freq: Every day | ORAL | 3 refills | Status: DC
  Filled 2022-10-18: qty 30, 30d supply, fill #0
  Filled 2022-11-16: qty 30, 30d supply, fill #1
  Filled 2022-11-22 – 2022-12-24 (×2): qty 30, 30d supply, fill #2
  Filled 2023-02-04: qty 30, 30d supply, fill #3
  Filled 2023-03-02: qty 30, 30d supply, fill #4

## 2022-03-29 MED ORDER — ENTRESTO 49-51 MG PO TABS
1.0000 | ORAL_TABLET | Freq: Two times a day (BID) | ORAL | 3 refills | Status: DC
  Filled 2022-10-11 – 2022-10-28 (×3): qty 30, 15d supply, fill #0

## 2022-03-29 MED ORDER — EMPAGLIFLOZIN 10 MG PO TABS: ORAL_TABLET | ORAL | 3 refills | Status: DC

## 2022-03-29 NOTE — Progress Notes (Signed)
IDCelita Silva, DOB Feb 13, 1961, MRN [id number]   Provider location: [place] [facility] Type of Visit: Established patient   PCP:  Ladell Pier, MD Endocrinology: Dr. Dwyane Dee  HF Cardiology: Dr Aundra Dubin   History of Present Illness: Tamara Silva is a 61 y.o. 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  She has had frequent admits for anemia and CHF with transfusions and diuresis. Prasugrel was stopped. No source of bleeding found on EGD and colonoscopy.   She is on home 02 and nocturnal BiPAP for OHS/OSA and COPD.   She no longer smokes.  Hx of PE in 2014.    Hx of bradycardia with Coreg at higher dose.  Echo 05/31/18 with EF 40-45%, PA peak pressure 71 mmHg.    Admitted 10/25/18 with SOB and edema, + productive cough.  This started over a few weeks prior to admission. Hospital course complicated by PEA arrest on 10/28/18. CPR for 25 minutes. Extubated 10/30/18.  Developed AKI.  HF team followed closely to optimize HF.  She was massively volume overloaded and extensively diuresed, weight down 65 lbs total.  Echo showed EF down to 30-35%.  No coronary angiography due to AKI.   Readmitted  11/17/18 with lower extremity edema and volume overload. Diuresed with IV lasix and transitioned to torsemide 40 mg twice daily. Discharged on 11/21/18 to home on oxygen.   She had RHC/LHC in 9/20, most significant stenosis was 75% ostial D1.  There was mild to moderate nonobstructive disease in other vessels. There was not an interventional target.  Filling pressures were mildly elevated with preserved cardiac output.  Torsemide was increased.   Echo in 10/20 showed that EF remained 30-35% with wall motion abnormalities, RV appeared normal.   Medtronic ICD placed in 11/20.   She was lost to follow up since 7/21. Returned 1/23 and had been off metolazone for several  months. She was mildly SOB walking around her house. Repeat echo and paramedicine arranged.  Echo in 2/23 showed EF 30-35%, dyskinetic apex, severe LV dilation, normal RV size and systolic function.  Admitted 5/23 with AKI, given IVF fluids. GDMT held. SCr improved and Bidil, torsemide and spiro restarted. Remained off Entresto. Bisoprolol stopped due to bradycardia and junctional rhythm. Discharged home, weight 332 lbs.  Today she returns for post hospital HF follow up with her boyfriend. She continues to use oxygen during the day and Bipap at night for OHS/OSA. Short of breath walking longer distances outside the house.  No palpitations.  No lightheadedness.  No chest pain.  No falls.  Weight is down 4 lbs. She ran out of [place].    MDT device interrogation (device interrogation): No atrial fibrillation since last check but possible runs noted prior to that.  No VT. Fluid index > threshold.   ECG (personally reviewed): NSR, anterior and inferior MIs with nonspecific T wave changes diffusely.   Labs (7/20): K 3.7, creatinine 1.56 Labs (9/20): K 3.7, creatinine 1.46, LDL 52, HDL 56  Labs (12/20): K 3.8, creatinine 1.51 Labs (1/21): hgb 10.6, K 4.4, creatinine 1.91 Labs (5/21): LDL 68, HDL 56 Labs (7/21): K 4.4, creatinine 2.34, hgb 9.7 Labs (11/22): K 4.4., creatinine 2.30, hgb 9.3 Labs (1/23): K 4.0, creatinine 2.32 Labs (2/23): LDL 55, TGs 74 Labs (3/23): K 4.4, creatinine 2.33  Labs (4/23): Creatinine 1.88 Labs (5/23): K 4.7, creatinine 1.81, BNP 78.5  PMH: 1. CVA (1/20). 2. Type 2 diabetes 3. HTN 4. H/o seizures 5. PEA arrest (5/20) with CPR.  6. COPD: Home oxygen.  Stopped smoking in 1/15.  7. CAD: PCI [facility] 2006, [facility] 2009.  - Inferior STEMI 5/15, DES to RCA.  - Poor responder to Plavix.  - LHC (9/20): 75% ostial D1, 40-50% mLCx, 40% proximal PLV.  8. Hyperlipidemia 9. PE: 2014, [facility].  10. OHS/OSA: Home oxygen and Bipap used.  11. Chronic systolic CHF: Suspect  primarily ischemic cardiomyopathy. Medtronic ICD.  - Echo (5/15): EF 35-40% - Echo (2/16): EF 40-45% - Echo (1/20): EF 40-45% - Echo (5/20): EF 30-35%, mild RV dilation.  - RHC (5/20): mean RA 9, PA 62/22 mean 37, mean PCWP 15, CI 4.29, PVR 2.1 WU.  - RHC (9/20): mean RA 11, PA 66/18 mean 37, mean PCWP 19, CI 3.12, PVR 2.4 WU - Echo (10/20): EF 30-35% with wall motion abnormalities, normal RV.  - Echo (2/23): EF 30-35%, dyskinetic apex, severe LV dilation, normal RV size and systolic function. 12. CKD: Stage 3.  13. Fe deficiency anemia: EGD and colonoscopy without definite source of bleeding.   ROS: All systems negative except as listed in HPI, PMH and Problem List.  SH:  Social History   Socioeconomic History   Marital status: Single    Spouse name: Not on file   Number of children: 1   Years of education: Not on file   Highest education level: Not on file  Occupational History   Not on file  Tobacco Use   Smoking status: Former    Packs/day: 0.50    Years: 20.00    Total pack years: 10.00    Types: Cigarettes    Quit date: 05/31/2018    Years since quitting: 3.8   Smokeless tobacco: Never   Tobacco comments:    smoked off and on x 20 years  Vaping Use   Vaping Use: Never used  Substance and Sexual Activity   Alcohol use: No    Alcohol/week: 0.0 standard drinks of alcohol   Drug use: Not Currently    Types: Cocaine   Sexual activity: Yes  Other Topics Concern   Not on file  Social History Narrative   Origibnally from [place]   Most recently from [place]   Daughter lives in town      On Social security to CHF, COPD, CAD   Social Determinants of Health   Financial Resource Strain: Medium Risk (01/13/2018)   Overall Financial Resource Strain (CARDIA)    Difficulty of Paying Living Expenses: Somewhat hard  Food Insecurity: Food Insecurity Present (01/13/2018)   Hunger Vital Sign    Worried About Running Out of Food in the Last Year: Sometimes true    Ran Out  of Food in the Last Year: Sometimes true  Transportation Needs: Unmet Transportation Needs (01/13/2018)   PRAPARE - Hydrologist (Medical): Yes    Lack of Transportation (Non-Medical): Yes  Physical Activity: Inactive (01/13/2018)   Exercise Vital Sign    Days of Exercise per Week: 0 days    Minutes of Exercise per Session: 0 min  Stress: Stress Concern Present (01/13/2018)   [place]    Feeling of Stress : Rather much  Social Connections: Moderately Integrated (01/13/2018)   Social Connection and Isolation Panel [NHANES]  Frequency of Communication with Friends and Family: Twice a week    Frequency of Social Gatherings with Friends and Family: Once a week    Attends Religious Services: 1 to 4 times per year    Active Member of [employer] or Organizations: No    Attends Archivist Meetings: Never    Marital Status: Living with partner  Intimate Partner Violence: Unknown (06/25/2018)   Humiliation, Afraid, Rape, and Kick questionnaire    Fear of Current or Ex-Partner: Patient refused    Emotionally Abused: Patient refused    Physically Abused: Patient refused    Sexually Abused: Patient refused   FH:  Family History  Problem Relation Age of Onset   Stroke Mother    Hypertension Mother    Colon cancer Father    Diabetes Father    Heart attack Father    Sarcoidosis Other    Breast cancer Paternal Aunt    Emphysema Brother    Lung disease Brother        Unknown type, 3 brothers, one with liver and lung disease   Diabetes Brother    Asthma Paternal Aunt    Diabetes Sister     Current Outpatient Medications  Medication Sig Dispense Refill   ACCU-CHEK AVIVA PLUS test strip USE 1 STRIP TO CHECK BLOOD SUGAR 3 TIMES A DAY . 100 each 0   acetaminophen (TYLENOL) 500 MG tablet Take 1,000 mg by mouth every 6 (six) hours as needed for moderate pain or headache.     ANORO ELLIPTA 62.5-25  MCG/ACT AEPB INHALE 1 PUFF INTO THE LUNGS DAILY. 60 each 0   ascorbic acid (VITAMIN C) 500 MG tablet TAKE ONE TABLET BY MOUTH ONCE DAILY 90 tablet 3   ASPIRIN LOW DOSE 81 MG EC tablet CHEW 1 TABLET (81 MG TOTAL) BY MOUTH DAILY. 90 tablet 3   BIDIL 20-37.5 MG tablet TAKE 1 TABLET BY MOUTH 3 (THREE) TIMES DAILY. 270 tablet 3   bisacodyl (DULCOLAX) 5 MG EC tablet Take 1 tablet (5 mg total) by mouth daily as needed for moderate constipation. 30 tablet 0   Blood Glucose Monitoring Suppl (ACCU-CHEK GUIDE) w/Device KIT Use accu chek guide to check blood sugar three times daily. 1 kit 0   Continuous Blood Gluc Sensor (FREESTYLE LIBRE 2 SENSOR) MISC USE ONE SENSOR EVERY 14 (FOURTEEN) DAYS. 2 each 6   D3-1000 25 MCG (1000 UT) tablet Take 1,000 Units by mouth 2 (two) times daily.     FEROSUL 325 (65 Fe) MG tablet TAKE 1 TABLET (325 MG TOTAL) BY MOUTH DAILY AFTER SUPPER. PLEASE TAKE WITH A SOURCE OF VITAMIN C 90 tablet 3   fluticasone (FLONASE) 50 MCG/ACT nasal spray Place 1 spray into both nostrils daily. 16 g 6   hydrocerin (EUCERIN) CREA Apply 1 application topically 2 (two) times daily.  0   Insulin Degludec (TRESIBA [place]) Inject 80 Units into the skin daily in the afternoon.     Insulin Pen Needle (EASY COMFORT PEN NEEDLES) 31G X 5 MM MISC USE 3 TIMES A DAY FOR INSULIN ADMINISTRATION 100 each 12   ipratropium-albuterol (DUONEB) 0.5-2.5 (3) MG/3ML SOLN INHALE CONTENTS OF ONE VIAL USING NEBULIZER EVERY 6 HOURS AS NEEDED FOR SHORTNESS OF BREATH, WHEEZING 360 mL 5   NEEDLE, DISP, 26 G 26G X 1/2" MISC 1 application by Does not apply route daily. 100 each 0   omeprazole (PRILOSEC) 40 MG capsule TAKE 1 CAPSULE (40 MG TOTAL) BY MOUTH DAILY. 90 capsule 0  polyethylene glycol (MIRALAX / GLYCOLAX) 17 g packet Take 17 g by mouth daily as needed. 14 each 6   RESTASIS 0.05 % ophthalmic emulsion Place 1 drop into both eyes daily as needed (dry eyes).     rosuvastatin (CRESTOR) 40 MG tablet TAKE 1 TABLET (40 MG TOTAL) BY  MOUTH DAILY AT 6 PM. 90 tablet 0   sacubitril-valsartan (ENTRESTO) 49-51 MG Take 1 tablet by mouth 2 (two) times daily. 90 tablet 3   Semaglutide,0.25 or 0.5MG/DOS, (OZEMPIC, 0.25 OR 0.5 MG/DOSE,) 2 MG/1.5ML SOPN Inject 2 mg into the skin once a week. 6 mL 5   spironolactone (ALDACTONE) 25 MG tablet Take 25 mg by mouth daily.     torsemide (DEMADEX) 100 MG tablet Take 1 tablet (100 mg total) by mouth in the morning AND 0.5 tablets (50 mg total) every evening. 45 tablet 3   vitamin B-12 (CYANOCOBALAMIN) 1000 MCG tablet Take 1,000 mcg by mouth daily.     empagliflozin (JARDIANCE) 10 MG TABS tablet TAKE ONE TABLET BY MOUTH ONCE DAILY BEFORE BREAKFAST 90 tablet 3   senna-docusate (SENOKOT-S) 8.6-50 MG tablet Take 1 tablet by mouth 2 (two) times daily. 30 tablet 0   No current facility-administered medications for this encounter.   BP 120/60   Pulse 65   Wt (!) 147 kg (324 lb)   LMP 05/30/2013   SpO2 100% Comment: 2l n/c  BMI 50.00 kg/m   Wt Readings from Last 3 Encounters:  03/29/22 (!) 147 kg (324 lb)  03/29/22 (!) 147 kg (324 lb)  12/14/21 (!) 146 kg (321 lb 12.8 oz)   Physical Exam:   General: NAD Neck: JVP 8 cm, no thyromegaly or thyroid nodule.  Lungs: Clear to auscultation bilaterally with normal respiratory effort. CV: Nondisplaced PMI.  Heart regular S1/S2, no S3/S4, no murmur.  Trace ankle edema.  No carotid bruit.  Normal pedal pulses.  Abdomen: Soft, nontender, no hepatosplenomegaly, no distention.  Skin: Intact without lesions or rashes.  Neurologic: Alert and oriented x 3.  Psych: Normal affect. Extremities: No clubbing or cyanosis.  HEENT: Normal.   Assessment & Plan: 1. H/O PEA arrest: Due to severe hypoxemia in 5/20 in setting of CHF exacerbation.   2. Chronic systolic CHF: Suspect ischemic cardiomyopathy.  Medtronic ICD.  Echo in 1/20 with EF 40-45%, RV moderately dilated, PASP 71 mmHg.  Echo in 5/20 with lower EF, 30-35%, and mildly dilated RV.  Suspect significant  RV failure, may be related to OHS/OSA and COPD. She was massively volume overloaded during 5/20 admission and diuresed extensively.  RHC done 11/06/18 showed high cardiac output and minimally elevated filling pressures after diuresis.  PVR was not elevated, appeared to be high output PH.  RHC was done again in 9/20, filling pressures were elevated with preserved cardiac output.  Echo in 10/20 showed EF still 30-35%.  Echo in 2/23 with EF 30-35% RV normal. Stable NYHA III symptoms, limited by body habitus and OHS/OSA. She is mildly volume overloaded by exam and Optivol.  - Continue torsemide from 100 mg /50 qpm.  BMET/BNP today. - Restart Jardiance 10 mg daily, should give some additional diuresis.  BMET 10 days.  - Continue Bidil 1 tab tid.   - Continue spironolactone 25 mg daily. - Increase Entresto to 49/51 bid, this should also allow increased diuresis. - Off bisoprolol with bradycardia and junctional rhythm. Will not add back today. - She is enrolled in monthly ICM for volume management. 3. CKD: Stage III. She sees  Dr. Royce Macadamia. BMET today. 4. COPD: On home oxygen at baseline.  Has quit smoking.  5. OHS/OSA: On home oxygen at all times and wears Bipap at night. She is followed by Pulmonary. 6. CAD: History of PCI, most recently had inferior STEMI in 5/15 with DES to RCA.  Coronary angiography was done in 9/20, there was 75% ostial D1 stenosis but no good target for PCI. No chest pain.  - Continue ASA 81 and Crestor 40 daily. Good lipids in 2/23.  7. Remote PE: She has not been anticoagulated.  8. Anemia: History of GI bleeding, had endoscopies earlier in 2021 with no bleeding source found.  Also with history of B12 deficiency. She has seen Dr. Lorenso Courier for this. 9. Pulmonary hypertension: Pulmonary venous hypertension on last RHC in 9/20.  10. Obesity: Body mass index is 50 kg/m. We discussed diet and exercise.  - Continue semaglutide.  11. Atrial fibrillation: ?AF episodes by device interrogation  in early summer, not recently.  Would like EP to assess and decide if these episodes were truly AF and require anticogulation.   Followup 2 months with APP.   Loralie Champagne 03/29/2022

## 2022-03-29 NOTE — Patient Instructions (Signed)
RESTART Jardiance 10 mg daily.  INCREASE Entresto to 49/51 Twice daily  Labs done today, your results will be available in MyChart, we will contact you for abnormal readings.  Repeat blood work in 10 days.  Your physician recommends that you schedule a follow-up appointment in: 2 months  If you have any questions or concerns before your next appointment please send [place] a message through [place] or call our office at [phone].    TO LEAVE A MESSAGE FOR THE NURSE SELECT OPTION 2, PLEASE LEAVE A MESSAGE INCLUDING: YOUR NAME DATE OF BIRTH CALL BACK NUMBER REASON FOR CALL**this is important as we prioritize the call backs  YOU WILL RECEIVE A CALL BACK THE SAME DAY AS LONG AS YOU CALL BEFORE 4:00 PM  At the [facility], you and your health needs are our priority. As part of our continuing mission to provide you with exceptional heart care, we have created designated Provider Care Teams. These Care Teams include your primary Cardiologist (physician) and Advanced Practice Providers (APPs- Physician Assistants and Nurse Practitioners) who all work together to provide you with the care you need, when you need it.   You may see any of the following providers on your designated Care Team at your next follow up: Dr Glori Bickers Dr Loralie Champagne Dr. Roxana Hires, NP Lyda Jester, [place] [facility] [place], [place] Forestine Na, NP Audry Riles, PharmD   Please be sure to bring in all your medications bottles to every appointment.

## 2022-03-29 NOTE — Patient Instructions (Signed)
Medication Instructions:  Your physician recommends that you continue on your current medications as directed. Please refer to the Current Medication list given to you today.  *If you need a refill on your cardiac medications before your next appointment, please call your pharmacy*   Lab Work: None ordered.  If you have labs (blood work) drawn today and your tests are completely normal, you will receive your results only by: MyChart Message (if you have MyChart) OR A paper copy in the mail If you have any lab test that is abnormal or we need to change your treatment, we will call you to review the results.   Testing/Procedures: None ordered.    Follow-Up: At [facility], you and your health needs are our priority.  As part of our continuing mission to provide you with exceptional heart care, we have created designated Provider Care Teams.  These Care Teams include your primary Cardiologist (physician) and Advanced Practice Providers (APPs -  Physician Assistants and Nurse Practitioners) who all work together to provide you with the care you need, when you need it.  We recommend signing up for the patient portal called "MyChart".  Sign up information is provided on this After Visit Summary.  MyChart is used to connect with patients for Virtual Visits (Telemedicine).  Patients are able to view lab/test results, encounter notes, upcoming appointments, etc.  Non-urgent messages can be sent to your provider as well.   To learn more about what you can do with MyChart, go to [email or web address].    Your next appointment:   12 months with Dr Klein  Important Information About Sugar       

## 2022-03-29 NOTE — Progress Notes (Signed)
Electrophysiology Office Note   Date:  03/29/2022   ID:  Tamara Silva, DOB 1961/03/19, MRN [id number]  PCP:  Ladell Pier, MD  Cardiologist:  Aundra Dubin Primary Electrophysiologist:  Liam Cammarata Meredith Leeds, MD    Chief Complaint: CHF   History of Present Illness: Tamara Silva is a 61 y.o. female who is being seen today for the evaluation of CHV at the request of Ladell Pier, MD. Presenting today for electrophysiology evaluation.  She has a history sooner for chronic systolic heart failure, hypertension, type 2 diabetes, hyperlipidemia, CVA, coronary artery disease.  She had a STEMI in 2015 post RCA stent.  She has had frequent admissions with anemia and heart failure.  She has obstructive sleep apnea and COPD and is on home oxygen and BiPAP.  She is now status post Medtronic ICD implanted 06/03/2018.  Today, denies symptoms of palpitations, chest pain, shortness of breath, orthopnea, PND, lower extremity edema, claudication, dizziness, presyncope, syncope, bleeding, or neurologic sequela. The patient is tolerating medications without difficulties.  She is overall doing well.  She has no chest pain or shortness of breath.  She does state that she has not taken her medications yet today.  Her OptiVol is elevated.  She is followed by [facility] clinic.    Past Medical History:  Diagnosis Date   AICD (automatic cardioverter/defibrillator) present 04/04/2019   Asthma    CAD (coronary artery disease)    Chronic combined systolic (congestive) and diastolic (congestive) heart failure (HCC)    Chronic iron deficiency anemia    CVA (cerebral vascular accident) ([place]) 05/2018   Diabetes mellitus without complication ([place])    DOE (dyspnea on exertion)    Edema of both legs    Hepatic steatosis    Hypertension    Pulmonary emboli ([place]) 2014   Respiratory failure ([place]) 09/2018   Seizure ([place]) 05/2018   Past Surgical History:  Procedure Laterality Date   BIOPSY  07/10/2018   Procedure:  BIOPSY;  Surgeon: [name], MD;  Location: [place];  Service: Gastroenterology;;   COLONOSCOPY WITH PROPOFOL N/A 08/03/2013   Procedure: COLONOSCOPY WITH PROPOFOL;  Surgeon: Jeryl Columbia, MD;  Location: [facility];  Service: Endoscopy;  Laterality: N/A;   COLONOSCOPY WITH PROPOFOL N/A 07/10/2018   Procedure: COLONOSCOPY WITH PROPOFOL;  Surgeon: [name], MD;  Location: [facility];  Service: Gastroenterology;  Laterality: N/A;   CORONARY ANGIOPLASTY WITH STENT PLACEMENT     CAD in 2006 x 2 and 2009 2 more- place din REx in [place] [facility] [place] [facility]   CORONARY ANGIOPLASTY WITH STENT PLACEMENT  10/07/2013   Xience [place] DES 2.75  mm x 15  mm   ESOPHAGOGASTRODUODENOSCOPY (EGD) WITH PROPOFOL N/A 08/03/2013   Procedure: ESOPHAGOGASTRODUODENOSCOPY (EGD) WITH PROPOFOL;  Surgeon: Jeryl Columbia, MD;  [facility]: [facility];  Service: Endoscopy;  Laterality: N/A;   ESOPHAGOGASTRODUODENOSCOPY (EGD) WITH PROPOFOL N/A 07/10/2018   Procedure: ESOPHAGOGASTRODUODENOSCOPY (EGD) WITH PROPOFOL;  Surgeon: [name], MD;  Location: [place];  Service: Gastroenterology;  Laterality: N/A;   ICD IMPLANT  04/04/2019   ICD IMPLANT N/A 04/04/2019   Procedure: ICD IMPLANT;  Surgeon: Constance Haw, MD;  Location: [place] [facility];  Service: Cardiovascular;  Laterality: N/A;   LEFT HEART CATHETERIZATION WITH CORONARY ANGIOGRAM N/A 10/07/2013   Procedure: LEFT HEART CATHETERIZATION WITH CORONARY ANGIOGRAM;  Surgeon: Leonie Man, MD;  Location: [facility];  Service: Cardiovascular;  Laterality: N/A;   RIGHT HEART CATH N/A 11/06/2018   Procedure: RIGHT  HEART CATH;  Surgeon: Larey Dresser, MD;  Location: [place] [facility];  Service: Cardiovascular;  Laterality: N/A;   RIGHT/LEFT HEART CATH AND CORONARY ANGIOGRAPHY N/A 02/12/2019   Procedure: RIGHT/LEFT HEART CATH AND CORONARY ANGIOGRAPHY;  Surgeon: Larey Dresser, MD;  Location: [place] [facility];  Service: Cardiovascular;  Laterality: N/A;      Current Outpatient Medications  Medication Sig Dispense Refill   ACCU-CHEK AVIVA PLUS test strip USE 1 STRIP TO CHECK BLOOD SUGAR 3 TIMES A DAY . 100 each 0   acetaminophen (TYLENOL) 500 MG tablet Take 1,000 mg by mouth every 6 (six) hours as needed for moderate pain or headache.     ANORO ELLIPTA 62.5-25 MCG/ACT AEPB INHALE 1 PUFF INTO THE LUNGS DAILY. 60 each 0   ascorbic acid (VITAMIN C) 500 MG tablet TAKE ONE TABLET BY MOUTH ONCE DAILY 90 tablet 3   ASPIRIN LOW DOSE 81 MG EC tablet CHEW 1 TABLET (81 MG TOTAL) BY MOUTH DAILY. 90 tablet 3   BIDIL 20-37.5 MG tablet TAKE 1 TABLET BY MOUTH 3 (THREE) TIMES DAILY. 270 tablet 3   bisacodyl (DULCOLAX) 5 MG EC tablet Take 1 tablet (5 mg total) by mouth daily as needed for moderate constipation. 30 tablet 0   Blood Glucose Monitoring Suppl (ACCU-CHEK GUIDE) w/Device KIT Use accu chek guide to check blood sugar three times daily. 1 kit 0   Continuous Blood Gluc Sensor (FREESTYLE LIBRE 2 SENSOR) MISC USE ONE SENSOR EVERY 14 (FOURTEEN) DAYS. 2 each 6   D3-1000 25 MCG (1000 UT) tablet Take 1,000 Units by mouth 2 (two) times daily.     empagliflozin (JARDIANCE) 10 MG TABS tablet TAKE ONE TABLET BY MOUTH ONCE DAILY BEFORE BREAKFAST 90 tablet 3   FEROSUL 325 (65 Fe) MG tablet TAKE 1 TABLET (325 MG TOTAL) BY MOUTH DAILY AFTER SUPPER. PLEASE TAKE WITH A SOURCE OF VITAMIN C 90 tablet 3   fluticasone (FLONASE) 50 MCG/ACT nasal spray Place 1 spray into both nostrils daily. 16 g 6   hydrocerin (EUCERIN) CREA Apply 1 application topically 2 (two) times daily.  0   Insulin Degludec (TRESIBA [place]) Inject 80 Units into the skin daily in the afternoon.     Insulin Pen Needle (EASY COMFORT PEN NEEDLES) 31G X 5 MM MISC USE 3 TIMES A DAY FOR INSULIN ADMINISTRATION 100 each 12   ipratropium-albuterol (DUONEB) 0.5-2.5 (3) MG/3ML SOLN INHALE CONTENTS OF ONE VIAL USING NEBULIZER EVERY 6 HOURS AS NEEDED FOR SHORTNESS OF BREATH, WHEEZING 360 mL 5   NEEDLE, DISP, 26 G 26G X  1/2" MISC 1 application by Does not apply route daily. 100 each 0   omeprazole (PRILOSEC) 40 MG capsule TAKE 1 CAPSULE (40 MG TOTAL) BY MOUTH DAILY. 90 capsule 0   polyethylene glycol (MIRALAX / GLYCOLAX) 17 g packet Take 17 g by mouth daily as needed. 14 each 6   RESTASIS 0.05 % ophthalmic emulsion Place 1 drop into both eyes daily as needed (dry eyes).     rosuvastatin (CRESTOR) 40 MG tablet TAKE 1 TABLET (40 MG TOTAL) BY MOUTH DAILY AT 6 PM. 90 tablet 0   sacubitril-valsartan (ENTRESTO) 49-51 MG Take 1 tablet by mouth 2 (two) times daily. 90 tablet 3   Semaglutide,0.25 or 0.5MG/DOS, (OZEMPIC, 0.25 OR 0.5 MG/DOSE,) 2 MG/1.5ML SOPN Inject 2 mg into the skin once a week. 6 mL 5   senna-docusate (SENOKOT-S) 8.6-50 MG tablet Take 1 tablet by mouth 2 (two) times daily. 30 tablet  0   spironolactone (ALDACTONE) 25 MG tablet Take 25 mg by mouth daily.     torsemide (DEMADEX) 100 MG tablet Take 1 tablet (100 mg total) by mouth in the morning AND 0.5 tablets (50 mg total) every evening. 45 tablet 3   vitamin B-12 (CYANOCOBALAMIN) 1000 MCG tablet Take 1,000 mcg by mouth daily.     No current facility-administered medications for this visit.    Allergies:   Metolazone, Plavix [clopidogrel bisulfate], Ibuprofen, and Nsaids   Social History:  The patient  reports that she quit smoking about 3 years ago. Her smoking use included cigarettes. She has a 10.00 pack-year smoking history. She has never used smokeless tobacco. She reports that she does not currently use drugs after having used the following drugs: Cocaine. She reports that she does not drink alcohol.   Family History:  The patient's family history includes Asthma in her paternal aunt; Breast cancer in her paternal aunt; Colon cancer in her father; Diabetes in her brother, father, and sister; Emphysema in her brother; Heart attack in her father; Hypertension in her mother; Lung disease in her brother; Sarcoidosis in an other family member; Stroke in  her mother.   ROS:  Please see the history of present illness.   Otherwise, review of systems is positive for none.   All other systems are reviewed and negative.   PHYSICAL EXAM: VS:  BP 118/76   Pulse 84   Ht 5' 7.5" (1.715 m)   Wt (!) 324 lb (147 kg)   LMP 05/30/2013   SpO2 99%   BMI 50.00 kg/m  , BMI Body mass index is 50 kg/m. GEN: Well nourished, well developed, in no acute distress  HEENT: normal  Neck: no JVD, carotid bruits, or masses Cardiac: RRR; no murmurs, rubs, or gallops,no edema  Respiratory:  clear to auscultation bilaterally, normal work of breathing GI: soft, nontender, nondistended, + BS MS: no deformity or atrophy  Skin: warm and dry, device site well healed Neuro:  Strength and sensation are intact Psych: euthymic mood, full affect  EKG:  EKG is not ordered today. Personal review of the ekg ordered 10/22/21 shows  Sinus rhythm, PACs, inferolateral and anterolateral infarct, rate 67  Personal review of the device interrogation today. Results in [place]: 10/14/2021: ALT 13 10/15/2021: Hemoglobin 10.4; Magnesium 1.8; Platelets 221 03/29/2022: B Natriuretic Peptide 88.5; BUN 30; Creatinine, Ser 1.63; Potassium 3.7; Sodium 142    Lipid Panel     Component Value Date/Time   CHOL 128 07/28/2021 1526   TRIG 74 07/28/2021 1526   HDL 58 07/28/2021 1526   CHOLHDL 2.2 07/28/2021 1526   VLDL 15 07/28/2021 1526   LDLCALC 55 07/28/2021 1526     Wt Readings from Last 3 Encounters:  03/29/22 (!) 324 lb (147 kg)  03/29/22 (!) 324 lb (147 kg)  12/14/21 (!) 321 lb 12.8 oz (146 kg)      Other studies Reviewed: Additional studies/ records that were reviewed today include: TTE 03/15/19  Review of the above records today demonstrates:   1. Left ventricular ejection fraction, by visual estimation, is 30 to 35%. The left ventricle has moderately decreased function. Mildly increased left ventricular size. There is no left ventricular hypertrophy. The  mid to apical anteroseptal and  inferoseptal walls were akinetic, the apical lateral wall appeared aneurysmal, the apical inferior wall was akinetic and the true apex was akinetic.  2. Left ventricular diastolic Doppler parameters are consistent with impaired relaxation  pattern of LV diastolic filling.  3. Global right ventricle has normal systolic function.The right ventricular size is normal. No increase in right ventricular wall thickness.  4. Left atrial size was normal.  5. Right atrial size was normal.  6. Mild mitral annular calcification.  7. The mitral valve is normal in structure. No evidence of mitral valve regurgitation. No evidence of mitral stenosis.  8. The tricuspid valve is normal in structure. Tricuspid valve regurgitation was not visualized by color flow Doppler.  9. The aortic valve is tricuspid Aortic valve regurgitation was not visualized by color flow Doppler. Structurally normal aortic valve, with no evidence of sclerosis or stenosis. 10. TR signal is inadequate for assessing pulmonary artery systolic pressure. 11. Technically difficult study with poor acoustic windows.  LHC/RHC 02/12/19 1. Mildly elevated left and right heart filling pressures with primarily pulmonary venous hypertension and preserved cardiac output.  2. 75% ostial stenosis in a moderate D1, otherwise nonobstructive disease.  No interventional target.     ASSESSMENT AND PLAN:  1.  Chronic systolic heart failure: Likely due to ischemic cardiomyopathy.  Currently on BiDil 20/37.5 mg 3 times daily, Jardiance 10 mg daily, Entresto 49/51 mg twice daily.  Is status post Medtronic single-chamber ICD implanted 06/03/2018.  Device functioning appropriately.  No changes at this time.  2.  Coronary disease: Status post RCA PCI.  No current chest pain.  3.  CKD stage III: Plan per primary cardiology.  4.  Obstructive sleep apnea: BiPAP compliance encouraged   Current medicines are reviewed at length with the  patient today.   The patient does not have concerns regarding her medicines.  The following changes were made today:  none  Labs/ tests ordered today include:  No orders of the defined types were placed in this encounter.    Disposition:   FU 1 year  Signed, Saadiya Wilfong Meredith Leeds, MD  03/29/2022 3:47 PM     [place] [address] [phone] (office) [phone] (fax)

## 2022-04-06 ENCOUNTER — Other Ambulatory Visit: Payer: Self-pay | Admitting: Endocrinology

## 2022-04-06 DIAGNOSIS — I25119 Atherosclerotic heart disease of native coronary artery with unspecified angina pectoris: Secondary | ICD-10-CM

## 2022-04-08 ENCOUNTER — Other Ambulatory Visit (HOSPITAL_COMMUNITY): Payer: Medicaid Other

## 2022-04-12 ENCOUNTER — Ambulatory Visit (INDEPENDENT_AMBULATORY_CARE_PROVIDER_SITE_OTHER): Payer: Medicaid Other

## 2022-04-12 DIAGNOSIS — Z95810 Presence of automatic (implantable) cardiac defibrillator: Secondary | ICD-10-CM

## 2022-04-12 DIAGNOSIS — I5042 Chronic combined systolic (congestive) and diastolic (congestive) heart failure: Secondary | ICD-10-CM

## 2022-04-15 ENCOUNTER — Other Ambulatory Visit: Payer: Self-pay | Admitting: Internal Medicine

## 2022-04-15 NOTE — Telephone Encounter (Signed)
Requested medication (s) are due for refill today: expired medication  Requested medication (s) are on the active medication list: yes  Last refill:  11/27/19 #90 3 refills  Future visit scheduled: no , last seen 4 months ago  Notes to clinic:  expired medication . Do you want to renew Rx?     Requested Prescriptions  Pending Prescriptions Disp Refills   ASPIRIN LOW DOSE 81 MG tablet [Pharmacy Med Name: ASPIRIN LOW DOSE 81 MG ORAL TABLET DELAYED RELEASE] 90 tablet 3    Sig: TAKE ONE TABLET BY MOUTH ONCE DAILY     Analgesics:  NSAIDS - aspirin Failed - 04/15/2022  9:58 AM      Failed - Cr in normal range and within 360 days    Creatinine  Date Value Ref Range Status  12/20/2019 2.34 (H) 0.44 - 1.00 mg/dL Final   Creat  Date Value Ref Range Status  07/08/2015 0.89 0.50 - 1.05 mg/dL Final   Creatinine, Ser  Date Value Ref Range Status  03/29/2022 1.63 (H) 0.44 - 1.00 mg/dL Final   Creatinine, POC  Date Value Ref Range Status  10/06/2016 300 mg/dL Final   Creatinine,U  Date Value Ref Range Status  02/17/2021 56.2 mg/dL Final   Creatinine, Urine  Date Value Ref Range Status  05/29/2018 282.47 mg/dL Final    Comment:    Performed at [facility], [place] [address] [place], [place] [address]         Passed - eGFR is 10 or above and within 360 days    GFR, Est African American  Date Value Ref Range Status  07/08/2015 84 >=60 mL/min Final   GFR, Est AFR Am  Date Value Ref Range Status  12/20/2019 25 (L) >60 mL/min Final   GFR calc Af Amer  Date Value Ref Range Status  12/25/2019 23 (L) >60 mL/min Final   GFR, Est Non African American  Date Value Ref Range Status  07/08/2015 73 >=60 mL/min Final    Comment:      The estimated GFR is a calculation valid for adults (>=60 years old) that uses the CKD-EPI algorithm to adjust for age and sex. It is   not to be used for children, pregnant women, hospitalized patients,    patients on dialysis, or  with rapidly changing kidney function. According to the NKDEP, eGFR >89 is normal, 60-89 shows mild impairment, 30-59 shows moderate impairment, 15-29 shows severe impairment and <15 is ESRD.      GFR, Estimated  Date Value Ref Range Status  03/29/2022 35 (L) >60 mL/min Final    Comment:    (NOTE) Calculated using the CKD-EPI Creatinine Equation (2021)   12/20/2019 22 (L) >60 mL/min Final   GFR  Date Value Ref Range Status  07/29/2021 22.00 (L) >60.00 mL/min Final    Comment:    Calculated using the CKD-EPI Creatinine Equation (2021)         Passed - Patient is not pregnant      Passed - Valid encounter within last 12 months    Recent Outpatient Visits           4 months ago Establishing care with new doctor, encounter for   [place] Karle Plumber B, MD   2 years ago Type 2 diabetes mellitus with diabetic polyneuropathy, with long-term current use of insulin [facility])   [place] Ladell Pier, MD   2 years ago  Chronic systolic CHF (congestive heart failure) [facility])   [place] Karle Plumber B, MD   2 years ago Chronic systolic CHF (congestive heart failure) [facility])   [place] Karle Plumber B, MD   3 years ago Uncontrolled type 2 diabetes mellitus with peripheral neuropathy [facility])   [place] [facility] Ladell Pier, MD

## 2022-04-16 NOTE — Progress Notes (Signed)
EPIC Encounter for ICM Monitoring  Patient Name: Tamara Silva is a 61 y.o. female Date: 04/16/2022 Primary Care Physican: Ladell Pier, MD Primary Cardiologist: Aundra Dubin Electrophysiologist: Curt Bears 10/22/2021 Weight:  328 lbs 12/18/2021 Weight: 319 lbs 03/29/2022 Office Weight: 324 lbs                                                            Transmission reviewed.     Optivol thoracic impedance suggesting fluid levels returned to normal.     Prescribed:  Torsemide 100 mg take 1 tablet (100 mg total) by mouth every morning and 0.5 tablet (50 mg total) every evening  Spironolactone 25 mg take 1 tablet daily Jardiance 10 mg take 1 tablet daily before breakfast   Labs: 03/29/2022 Creatinine 1.63, BUN 30, Potassium 3.7, Sodium 142, GFR 35 10/22/2021 Creatinine 1.83, BUN 17, Potassium 3.8, Sodium 137, GFR 31 10/17/2021 Creatinine 1.81, BUN 39, Potassium 4.7, Sodium 142, GFR 31  10/16/2021 Creatinine 2.59, BUN 62, Potassium 4.4, Sodium 137, GFR 20  10/15/2021 Creatinine 2.32, BUN 54, Potassium 4.2, Sodium 136, GFR 23  A complete set of results can be found in Results Review.   Recommendations:   No changes.   Follow-up plan: ICM clinic phone appointment on 05/10/2022.   91 day device clinic remote transmission 07/20/2022.     EP/Cardiology Office Visits:  05/28/2022 with Dr Aundra Dubin.  Recall 03/24/2023 with Dr Curt Bears.   Copy of ICM check sent to Dr. Curt Bears.  .   3 month ICM trend: 04/12/2022.    12-14 Month ICM trend:     [name], RN 04/16/2022 3:16 PM

## 2022-04-20 ENCOUNTER — Ambulatory Visit (INDEPENDENT_AMBULATORY_CARE_PROVIDER_SITE_OTHER): Payer: Medicaid Other

## 2022-04-20 DIAGNOSIS — I469 Cardiac arrest, cause unspecified: Secondary | ICD-10-CM

## 2022-04-20 LAB — CUP PACEART REMOTE DEVICE CHECK
Battery Remaining Longevity: 116 mo
Battery Voltage: 3 V
Brady Statistic RV Percent Paced: 0.03 %
Date Time Interrogation Session: 20231121102306
HighPow Impedance: 57 Ohm
Implantable Lead Connection Status: 753985
Implantable Lead Implant Date: 20201104
Implantable Lead Location: 753860
Implantable Pulse Generator Implant Date: 20201104
Lead Channel Impedance Value: 266 Ohm
Lead Channel Impedance Value: 304 Ohm
Lead Channel Pacing Threshold Amplitude: 0.5 V
Lead Channel Pacing Threshold Pulse Width: 0.4 ms
Lead Channel Sensing Intrinsic Amplitude: 12.5 mV
Lead Channel Sensing Intrinsic Amplitude: 12.5 mV
Lead Channel Setting Pacing Amplitude: 2.5 V
Lead Channel Setting Pacing Pulse Width: 0.4 ms
Lead Channel Setting Sensing Sensitivity: 0.3 mV
Zone Setting Status: 755011
Zone Setting Status: 755011

## 2022-04-23 ENCOUNTER — Encounter: Payer: Self-pay | Admitting: Internal Medicine

## 2022-04-23 NOTE — Progress Notes (Signed)
Letter received from [facility] endocrinology dated 04/12/2022. Letter stated that patient has canceled or no showed her appointment without rescheduling and the referral will be closed.  To reopen the referral, patient can call [phone].

## 2022-04-26 ENCOUNTER — Other Ambulatory Visit: Payer: Self-pay | Admitting: Pulmonary Disease

## 2022-04-26 NOTE — Telephone Encounter (Signed)
Received refill request on albuterol 0.083% neb solution to [facility].  Pt last ov 01/26/2021.  Patient will need an OV for further refills.  Duoneb was filled on 03/27/2022.  Can refill x 1 when pt makes return OV.  Called phone number in chart,  phone not working number.  [employer] and got an updated number of [phone].  Spoke with patient.  Made ov with [name], NP for yearly follow up 05/06/22 at 10:00 am.  Sent in refill x 1 for albuterol neb sol

## 2022-05-06 ENCOUNTER — Ambulatory Visit: Payer: Medicaid Other | Admitting: Adult Health

## 2022-05-10 ENCOUNTER — Other Ambulatory Visit: Payer: Self-pay | Admitting: Endocrinology

## 2022-05-10 ENCOUNTER — Ambulatory Visit (INDEPENDENT_AMBULATORY_CARE_PROVIDER_SITE_OTHER): Payer: Medicaid Other

## 2022-05-10 DIAGNOSIS — I5042 Chronic combined systolic (congestive) and diastolic (congestive) heart failure: Secondary | ICD-10-CM | POA: Diagnosis not present

## 2022-05-10 DIAGNOSIS — Z95810 Presence of automatic (implantable) cardiac defibrillator: Secondary | ICD-10-CM | POA: Diagnosis not present

## 2022-05-11 NOTE — Progress Notes (Signed)
EPIC Encounter for ICM Monitoring  Patient Name: Tamara Silva is a 61 y.o. female Date: 05/11/2022 Primary Care Physican: Ladell Pier, MD Primary Cardiologist: Aundra Dubin Electrophysiologist: Curt Bears 10/22/2021 Weight:  328 lbs 12/18/2021 Weight: 319 lbs 03/29/2022 Office Weight: 324 lbs                                                            Spoke with patient and heart failure questions reviewed.  Transmission results reviewed.  Pt reports ankles are a little swollen but getting better.   She has been eating foods high in salt and does not take Torsemide evening dose.     Optivol thoracic impedance suggesting possible fluid accumulation starting 12/7.     Prescribed:  Torsemide 100 mg take 1 tablet (100 mg total) by mouth every morning and 0.5 tablet (50 mg total) every evening.  Pt reports 12/12 that she takes AM Torsemide and does not take the evening tablet.   Spironolactone 25 mg take 1 tablet daily Jardiance 10 mg take 1 tablet daily before breakfast   Labs: 03/29/2022 Creatinine 1.63, BUN 30, Potassium 3.7, Sodium 142, GFR 35 10/22/2021 Creatinine 1.83, BUN 17, Potassium 3.8, Sodium 137, GFR 31 10/17/2021 Creatinine 1.81, BUN 39, Potassium 4.7, Sodium 142, GFR 31  10/16/2021 Creatinine 2.59, BUN 62, Potassium 4.4, Sodium 137, GFR 20  10/15/2021 Creatinine 2.32, BUN 54, Potassium 4.2, Sodium 136, GFR 23  A complete set of results can be found in Results Review.   Recommendations: Advised to limit salt intake and to take evening Torsemide dosage as prescribed to help resolve fluid accumulation.   Follow-up plan: ICM clinic phone appointment on 05/18/2022.   91 day device clinic remote transmission 07/20/2022.     EP/Cardiology Office Visits:  05/28/2022 with HF clinic.  Recall 03/24/2023 with Dr Curt Bears.   Copy of ICM check sent to Dr. Curt Bears.  3 month ICM trend: 05/10/2022.    12-14 Month ICM trend:     [name], RN 05/11/2022 12:39 PM

## 2022-05-14 ENCOUNTER — Other Ambulatory Visit (HOSPITAL_COMMUNITY): Payer: Self-pay | Admitting: Cardiology

## 2022-05-14 ENCOUNTER — Other Ambulatory Visit: Payer: Self-pay | Admitting: Pulmonary Disease

## 2022-05-14 DIAGNOSIS — J302 Other seasonal allergic rhinitis: Secondary | ICD-10-CM

## 2022-05-17 ENCOUNTER — Ambulatory Visit: Payer: Medicaid Other

## 2022-05-17 DIAGNOSIS — Z95810 Presence of automatic (implantable) cardiac defibrillator: Secondary | ICD-10-CM

## 2022-05-17 DIAGNOSIS — I5042 Chronic combined systolic (congestive) and diastolic (congestive) heart failure: Secondary | ICD-10-CM

## 2022-05-18 NOTE — Progress Notes (Signed)
EPIC Encounter for ICM Monitoring  Patient Name: Tamara Silva is a 61 y.o. female Date: 05/18/2022 Primary Care Physican: Ladell Pier, MD Primary Cardiologist: Aundra Dubin Electrophysiologist: Curt Bears 10/22/2021 Weight:  328 lbs 12/18/2021 Weight: 319 lbs 03/29/2022 Office Weight: 324 lbs                                                            Spoke with patient and heart failure questions reviewed.  Transmission results reviewed.  Pt reports ankle swollen has resolved.      Optivol thoracic impedance suggesting fluid levels returned to normal after taking evening Torsemide dosage for a few days.       Prescribed:  Torsemide 100 mg take 1 tablet (100 mg total) by mouth every morning and 0.5 tablet (50 mg total) every evening.  Pt reports 12/12 that she takes AM Torsemide and does not take the evening tablet.   Spironolactone 25 mg take 1 tablet daily Jardiance 10 mg take 1 tablet daily before breakfast   Labs: 03/29/2022 Creatinine 1.63, BUN 30, Potassium 3.7, Sodium 142, GFR 35 10/22/2021 Creatinine 1.83, BUN 17, Potassium 3.8, Sodium 137, GFR 31 10/17/2021 Creatinine 1.81, BUN 39, Potassium 4.7, Sodium 142, GFR 31  10/16/2021 Creatinine 2.59, BUN 62, Potassium 4.4, Sodium 137, GFR 20  10/15/2021 Creatinine 2.32, BUN 54, Potassium 4.2, Sodium 136, GFR 23  A complete set of results can be found in Results Review.   Recommendations:   Encouraged to take Torsemide as prescribed.  No changes and encouraged to call if experiencing any fluid symptoms.   Follow-up plan: ICM clinic phone appointment on 06/21/2022.   91 day device clinic remote transmission 07/20/2022.     EP/Cardiology Office Visits:  05/28/2022 with HF clinic.  Recall 03/24/2023 with Dr Curt Bears.   Copy of ICM check sent to Dr. Curt Bears.  3 month ICM trend: 05/17/2022.    12-14 Month ICM trend:     [name], RN 05/18/2022 3:54 PM

## 2022-05-18 NOTE — Progress Notes (Signed)
Remote ICD transmission.   

## 2022-05-20 ENCOUNTER — Other Ambulatory Visit: Payer: Self-pay | Admitting: Hematology and Oncology

## 2022-05-22 ENCOUNTER — Other Ambulatory Visit: Payer: Self-pay | Admitting: Internal Medicine

## 2022-05-22 DIAGNOSIS — K219 Gastro-esophageal reflux disease without esophagitis: Secondary | ICD-10-CM

## 2022-05-26 ENCOUNTER — Other Ambulatory Visit (HOSPITAL_COMMUNITY): Payer: Self-pay | Admitting: Cardiology

## 2022-05-26 DIAGNOSIS — J302 Other seasonal allergic rhinitis: Secondary | ICD-10-CM

## 2022-05-28 ENCOUNTER — Ambulatory Visit (HOSPITAL_COMMUNITY)
Admission: RE | Admit: 2022-05-28 | Discharge: 2022-05-28 | Disposition: A | Discharge status: Home / Self Care | Payer: Medicaid Other | Source: Ambulatory Visit | Attending: Adult Health | Admitting: Adult Health

## 2022-05-28 VITALS — BP 120/68 | HR 72 | Wt 313.0 lb

## 2022-05-28 DIAGNOSIS — I4891 Unspecified atrial fibrillation: Secondary | ICD-10-CM | POA: Insufficient documentation

## 2022-05-28 DIAGNOSIS — I252 Old myocardial infarction: Secondary | ICD-10-CM | POA: Diagnosis not present

## 2022-05-28 DIAGNOSIS — I130 Hypertensive heart and chronic kidney disease with heart failure and stage 1 through stage 4 chronic kidney disease, or unspecified chronic kidney disease: Secondary | ICD-10-CM | POA: Diagnosis not present

## 2022-05-28 DIAGNOSIS — Z7985 Long-term (current) use of injectable non-insulin antidiabetic drugs: Secondary | ICD-10-CM | POA: Insufficient documentation

## 2022-05-28 DIAGNOSIS — F17210 Nicotine dependence, cigarettes, uncomplicated: Secondary | ICD-10-CM | POA: Diagnosis not present

## 2022-05-28 DIAGNOSIS — Z9981 Dependence on supplemental oxygen: Secondary | ICD-10-CM | POA: Insufficient documentation

## 2022-05-28 DIAGNOSIS — Z8674 Personal history of sudden cardiac arrest: Secondary | ICD-10-CM | POA: Insufficient documentation

## 2022-05-28 DIAGNOSIS — Z7982 Long term (current) use of aspirin: Secondary | ICD-10-CM | POA: Insufficient documentation

## 2022-05-28 DIAGNOSIS — I5022 Chronic systolic (congestive) heart failure: Secondary | ICD-10-CM | POA: Diagnosis present

## 2022-05-28 DIAGNOSIS — G4733 Obstructive sleep apnea (adult) (pediatric): Secondary | ICD-10-CM | POA: Insufficient documentation

## 2022-05-28 DIAGNOSIS — Z95810 Presence of automatic (implantable) cardiac defibrillator: Secondary | ICD-10-CM | POA: Insufficient documentation

## 2022-05-28 DIAGNOSIS — Z955 Presence of coronary angioplasty implant and graft: Secondary | ICD-10-CM | POA: Insufficient documentation

## 2022-05-28 DIAGNOSIS — Z7901 Long term (current) use of anticoagulants: Secondary | ICD-10-CM | POA: Diagnosis not present

## 2022-05-28 DIAGNOSIS — I5042 Chronic combined systolic (congestive) and diastolic (congestive) heart failure: Secondary | ICD-10-CM

## 2022-05-28 DIAGNOSIS — I2510 Atherosclerotic heart disease of native coronary artery without angina pectoris: Secondary | ICD-10-CM | POA: Diagnosis not present

## 2022-05-28 DIAGNOSIS — Z6841 Body Mass Index (BMI) 40.0 and over, adult: Secondary | ICD-10-CM | POA: Insufficient documentation

## 2022-05-28 DIAGNOSIS — D649 Anemia, unspecified: Secondary | ICD-10-CM | POA: Diagnosis not present

## 2022-05-28 DIAGNOSIS — E669 Obesity, unspecified: Secondary | ICD-10-CM | POA: Insufficient documentation

## 2022-05-28 DIAGNOSIS — E1122 Type 2 diabetes mellitus with diabetic chronic kidney disease: Secondary | ICD-10-CM | POA: Insufficient documentation

## 2022-05-28 DIAGNOSIS — J449 Chronic obstructive pulmonary disease, unspecified: Secondary | ICD-10-CM | POA: Diagnosis not present

## 2022-05-28 DIAGNOSIS — Z8673 Personal history of transient ischemic attack (TIA), and cerebral infarction without residual deficits: Secondary | ICD-10-CM | POA: Diagnosis not present

## 2022-05-28 DIAGNOSIS — Z794 Long term (current) use of insulin: Secondary | ICD-10-CM | POA: Insufficient documentation

## 2022-05-28 DIAGNOSIS — Z86711 Personal history of pulmonary embolism: Secondary | ICD-10-CM | POA: Diagnosis not present

## 2022-05-28 DIAGNOSIS — Z7984 Long term (current) use of oral hypoglycemic drugs: Secondary | ICD-10-CM | POA: Insufficient documentation

## 2022-05-28 DIAGNOSIS — N183 Chronic kidney disease, stage 3 unspecified: Secondary | ICD-10-CM | POA: Insufficient documentation

## 2022-05-28 DIAGNOSIS — Z79899 Other long term (current) drug therapy: Secondary | ICD-10-CM | POA: Insufficient documentation

## 2022-05-28 DIAGNOSIS — I272 Pulmonary hypertension, unspecified: Secondary | ICD-10-CM | POA: Diagnosis not present

## 2022-05-28 LAB — BASIC METABOLIC PANEL
Anion gap: 11 (ref 5–15)
BUN: 33 mg/dL — ABNORMAL HIGH (ref 8–23)
CO2: 28 mmol/L (ref 22–32)
Calcium: 9.2 mg/dL (ref 8.9–10.3)
Chloride: 102 mmol/L (ref 98–111)
Creatinine, Ser: 2.03 mg/dL — ABNORMAL HIGH (ref 0.44–1.00)
GFR, Estimated: 27 mL/min — ABNORMAL LOW (ref >60)
Glucose, Bld: 104 mg/dL — ABNORMAL HIGH (ref 70–99)
Potassium: 3.8 mmol/L (ref 3.5–5.1)
Sodium: 141 mmol/L (ref 135–145)

## 2022-05-28 NOTE — Patient Instructions (Signed)
Labs done today. We will contact you only if your labs are abnormal.  No medication changes were made. Please continue all current medications as prescribed.  Your physician recommends that you schedule a follow-up appointment in: 4 months  If you have any questions or concerns before your next appointment please send [place] a message through [place] or call our office at [phone].    TO LEAVE A MESSAGE FOR THE NURSE SELECT OPTION 2, PLEASE LEAVE A MESSAGE INCLUDING: YOUR NAME DATE OF BIRTH CALL BACK NUMBER REASON FOR CALL**this is important as we prioritize the call backs  YOU WILL RECEIVE A CALL BACK THE SAME DAY AS LONG AS YOU CALL BEFORE 4:00 PM   Do the following things EVERYDAY: Weigh yourself in the morning before breakfast. Write it down and keep it in a log. Take your medicines as prescribed Eat low salt foods--Limit salt (sodium) to 2000 mg per day.  Stay as active as you can everyday Limit all fluids for the day to less than 2 liters   At the [facility], you and your health needs are our priority. As part of our continuing mission to provide you with exceptional heart care, we have created designated Provider Care Teams. These Care Teams include your primary Cardiologist (physician) and Advanced Practice Providers (APPs- Physician Assistants and Nurse Practitioners) who all work together to provide you with the care you need, when you need it.   You may see any of the following providers on your designated Care Team at your next follow up: Dr Glori Bickers Dr Haynes Kerns, NP Lyda Jester, [place] Audry Riles, PharmD   Please be sure to bring in all your medications bottles to every appointment.

## 2022-05-28 NOTE — Progress Notes (Signed)
[name], DOB [birth year], MRN [id number]   Provider location: [place] [facility] Type of Visit: Established patient   PCP:  Ladell Pier, MD Endocrinology: Dr. Dwyane Dee  HF Cardiology: Dr Aundra Dubin   History of Present Illness: Tamara Silva is a 61 y.o. 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  She has had frequent admits for anemia and CHF with transfusions and diuresis. Prasugrel was stopped. No source of bleeding found on EGD and colonoscopy.   She is on home 02 and nocturnal BiPAP for OHS/OSA and COPD.   She no longer smokes.  Hx of PE in 2014.    Hx of bradycardia with Coreg at higher dose.  Echo 05/31/18 with EF 40-45%, PA peak pressure 71 mmHg.    Admitted 10/25/18 with SOB and edema, + productive cough.  This started over a few weeks prior to admission. Hospital course complicated by PEA arrest on 10/28/18. CPR for 25 minutes. Extubated 10/30/18.  Developed AKI.  HF team followed closely to optimize HF.  She was massively volume overloaded and extensively diuresed, weight down 65 lbs total.  Echo showed EF down to 30-35%.  No coronary angiography due to AKI.   Readmitted  11/17/18 with lower extremity edema and volume overload. Diuresed with IV lasix and transitioned to torsemide 40 mg twice daily. Discharged on 11/21/18 to home on oxygen.   She had RHC/LHC in 9/20, most significant stenosis was 75% ostial D1.  There was mild to moderate nonobstructive disease in other vessels. There was not an interventional target.  Filling pressures were mildly elevated with preserved cardiac output.  Torsemide was increased.   Echo in 10/20 showed that EF remained 30-35% with wall motion abnormalities, RV appeared normal.   Medtronic ICD placed in 11/20.   She was lost to follow up since 7/21. Returned 1/23 and had been off metolazone for several  months. She was mildly SOB walking around her house. Repeat echo and paramedicine arranged.  Echo in 2/23 showed EF 30-35%, dyskinetic apex, severe LV dilation, normal RV size and systolic function.  Admitted 5/23 with AKI, given IVF fluids. GDMT held. SCr improved and Bidil, torsemide and spiro restarted. Remained off Entresto. Bisoprolol stopped due to bradycardia and junctional rhythm. Discharged home, weight 332 lbs.  Today she returns for HF follow up with her boyfriend. Overall feeling fine. SOB with exertion but feels better. Denies PND/Orthopnea. Using oxygen as needed. Appetite has gone down on ozempic. No fever or chills. She doesn't have a working scale. Smoking cigarettes a few times a week. Takin all medications.   MDT device interrogation (device interrogation): Fluid index low. Activity ~1 hour. Impedance up   Labs (7/20): K 3.7, creatinine 1.56 Labs (9/20): K 3.7, creatinine 1.46, LDL 52, HDL 56  Labs (12/20): K 3.8, creatinine 1.51 Labs (1/21): hgb 10.6, K 4.4, creatinine 1.91 Labs (5/21): LDL 68, HDL 56 Labs (7/21): K 4.4, creatinine 2.34, hgb 9.7 Labs (11/22): K 4.4., creatinine 2.30, hgb 9.3 Labs (1/23): K 4.0, creatinine 2.32 Labs (2/23): LDL 55, TGs 74 Labs (3/23): K 4.4, creatinine 2.33 Labs (4/23): Creatinine 1.88 Labs (5/23): K 4.7, creatinine 1.81, BNP 78.5 Labs (03/29/22): K 3.7 Creatinine 1.6   PMH: 1. CVA (1/20). 2. Type 2 diabetes 3. HTN 4. H/o seizures 5. PEA arrest (5/20)  with CPR.  6. COPD: Home oxygen.  Stopped smoking in 1/15.  7. CAD: PCI [facility] 2006, [facility] 2009.  - Inferior STEMI 5/15, DES to RCA.  - Poor responder to Plavix.  - LHC (9/20): 75% ostial D1, 40-50% mLCx, 40% proximal PLV.  8. Hyperlipidemia 9. PE: 2014, LLL.  10. OHS/OSA: Home oxygen and Bipap used.  11. Chronic systolic CHF: Suspect primarily ischemic cardiomyopathy. Medtronic ICD.  - Echo (5/15): EF 35-40% - Echo (2/16): EF 40-45% - Echo (1/20): EF 40-45% - Echo  (5/20): EF 30-35%, mild RV dilation.  - RHC (5/20): mean RA 9, PA 62/22 mean 37, mean PCWP 15, CI 4.29, PVR 2.1 WU.  - RHC (9/20): mean RA 11, PA 66/18 mean 37, mean PCWP 19, CI 3.12, PVR 2.4 WU - Echo (10/20): EF 30-35% with wall motion abnormalities, normal RV.  - Echo (2/23): EF 30-35%, dyskinetic apex, severe LV dilation, normal RV size and systolic function. 12. CKD: Stage 3.  13. Fe deficiency anemia: EGD and colonoscopy without definite source of bleeding.   ROS: All systems negative except as listed in HPI, PMH and Problem List.  SH:  Social History   Socioeconomic History   Marital status: Single    Spouse name: Not on file   Number of children: 1   Years of education: Not on file   Highest education level: Not on file  Occupational History   Not on file  Tobacco Use   Smoking status: Former    Packs/day: 0.50    Years: 20.00    Total pack years: 10.00    Types: Cigarettes    Quit date: 05/31/2018    Years since quitting: 3.9   Smokeless tobacco: Never   Tobacco comments:    smoked off and on x 20 years  Vaping Use   Vaping Use: Never used  Substance and Sexual Activity   Alcohol use: No    Alcohol/week: 0.0 standard drinks of alcohol   Drug use: Not Currently    Types: Cocaine   Sexual activity: Yes  Other Topics Concern   Not on file  Social History Narrative   Origibnally from [place]   Most recently from [place]   Daughter lives in town      On Social security to CHF, COPD, CAD   Social Determinants of Health   Financial Resource Strain: Medium Risk (01/13/2018)   Overall Financial Resource Strain (CARDIA)    Difficulty of Paying Living Expenses: Somewhat hard  Food Insecurity: Food Insecurity Present (01/13/2018)   Hunger Vital Sign    Worried About Running Out of Food in the Last Year: Sometimes true    Ran Out of Food in the Last Year: Sometimes true  Transportation Needs: Unmet Transportation Needs (01/13/2018)   PRAPARE - Armed forces logistics/support/administrative officer (Medical): Yes    Lack of Transportation (Non-Medical): Yes  Physical Activity: Inactive (01/13/2018)   Exercise Vital Sign    Days of Exercise per Week: 0 days    Minutes of Exercise per Session: 0 min  Stress: Stress Concern Present (01/13/2018)   [place]    Feeling of Stress : Rather much  Social Connections: Moderately Integrated (01/13/2018)   Social Connection and Isolation Panel [NHANES]    Frequency of Communication with Friends and Family: Twice a week    Frequency of Social Gatherings with Friends and Family: Once a week    Attends Religious  Services: 1 to 4 times per year    Active Member of Clubs or Organizations: No    Attends Archivist Meetings: Never    Marital Status: Living with partner  Intimate Partner Violence: Unknown (06/25/2018)   Humiliation, Afraid, Rape, and Kick questionnaire    Fear of Current or Ex-Partner: Patient refused    Emotionally Abused: Patient refused    Physically Abused: Patient refused    Sexually Abused: Patient refused   FH:  Family History  Problem Relation Age of Onset   Stroke Mother    Hypertension Mother    Colon cancer Father    Diabetes Father    Heart attack Father    Sarcoidosis Other    Breast cancer Paternal Aunt    Emphysema Brother    Lung disease Brother        Unknown type, 3 brothers, one with liver and lung disease   Diabetes Brother    Asthma Paternal Aunt    Diabetes Sister     Current Outpatient Medications  Medication Sig Dispense Refill   ACCU-CHEK AVIVA PLUS test strip USE 1 STRIP TO CHECK BLOOD SUGAR 3 TIMES A DAY . 100 each 0   acetaminophen (TYLENOL) 500 MG tablet Take 1,000 mg by mouth every 6 (six) hours as needed for moderate pain or headache.     albuterol (PROVENTIL) (2.5 MG/3ML) 0.083% nebulizer solution USE ONE VIAL BY NEBULIZATION EVERY 6 (SIX) HOURS AS NEEDED FOR WHEEZING OR SHORTNESS OF BREATH.  90 mL 0   ANORO ELLIPTA 62.5-25 MCG/ACT AEPB INHALE 1 PUFF INTO THE LUNGS DAILY. 60 each 0   ascorbic acid (VITAMIN C) 500 MG tablet TAKE ONE TABLET BY MOUTH ONCE DAILY 90 tablet 3   aspirin EC (ASPIRIN LOW DOSE) 81 MG tablet TAKE ONE TABLET BY MOUTH ONCE DAILY 90 tablet 0   BIDIL 20-37.5 MG tablet TAKE 1 TABLET BY MOUTH 3 (THREE) TIMES DAILY. 270 tablet 3   Blood Glucose Monitoring Suppl (ACCU-CHEK GUIDE) w/Device KIT Use accu chek guide to check blood sugar three times daily. 1 kit 0   Continuous Blood Gluc Sensor (FREESTYLE LIBRE 2 SENSOR) MISC USE ONE SENSOR EVERY 14 (FOURTEEN) DAYS. 2 each 6   D3-1000 25 MCG (1000 UT) tablet Take 1,000 Units by mouth 2 (two) times daily.     empagliflozin (JARDIANCE) 10 MG TABS tablet TAKE ONE TABLET BY MOUTH ONCE DAILY BEFORE BREAKFAST 90 tablet 3   FEROSUL 325 (65 Fe) MG tablet TAKE 1 TABLET (325 MG TOTAL) BY MOUTH DAILY AFTER SUPPER. PLEASE TAKE WITH A SOURCE OF VITAMIN C 90 tablet 3   fluticasone (FLONASE) 50 MCG/ACT nasal spray Place 1 spray into both nostrils daily. 16 g 6   hydrocerin (EUCERIN) CREA Apply 1 application topically 2 (two) times daily.  0   Insulin Degludec (TRESIBA [place]) Inject 80 Units into the skin daily in the afternoon.     Insulin Pen Needle (EASY COMFORT PEN NEEDLES) 31G X 5 MM MISC USE 3 TIMES A DAY FOR INSULIN ADMINISTRATION 100 each 12   ipratropium-albuterol (DUONEB) 0.5-2.5 (3) MG/3ML SOLN INHALE CONTENTS OF ONE VIAL USING NEBULIZER EVERY 6 HOURS AS NEEDED FOR SHORTNESS OF BREATH, WHEEZING 360 mL 5   NEEDLE, DISP, 26 G 26G X 1/2" MISC 1 application by Does not apply route daily. 100 each 0   omeprazole (PRILOSEC) 40 MG capsule TAKE 1 CAPSULE (40 MG TOTAL) BY MOUTH DAILY. 30 capsule 0   polyethylene glycol (MIRALAX / GLYCOLAX)  17 g packet Take 17 g by mouth daily as needed. 14 each 6   RESTASIS 0.05 % ophthalmic emulsion Place 1 drop into both eyes daily as needed (dry eyes).     sacubitril-valsartan (ENTRESTO) 49-51 MG Take 1  tablet by mouth 2 (two) times daily. 90 tablet 3   Semaglutide,0.25 or 0.5MG/DOS, (OZEMPIC, 0.25 OR 0.5 MG/DOSE,) 2 MG/1.5ML SOPN Inject 2 mg into the skin once a week. 6 mL 5   senna-docusate (SENOKOT-S) 8.6-50 MG tablet Take 1 tablet by mouth 2 (two) times daily. 30 tablet 0   spironolactone (ALDACTONE) 25 MG tablet Take 25 mg by mouth daily.     torsemide (DEMADEX) 100 MG tablet Take 1 tablet (100 mg total) by mouth in the morning AND 0.5 tablets (50 mg total) every evening. 45 tablet 3   vitamin B-12 (CYANOCOBALAMIN) 1000 MCG tablet Take 1,000 mcg by mouth daily.     No current facility-administered medications for this encounter.   BP 120/68   Pulse 72   Wt (!) 142 kg (313 lb)   LMP 05/30/2013   SpO2 98%   BMI 48.30 kg/m   Wt Readings from Last 3 Encounters:  05/28/22 (!) 142 kg (313 lb)  03/29/22 (!) 147 kg (324 lb)  03/29/22 (!) 147 kg (324 lb)   Physical Exam:   General: Arrived in a wheelchair. No resp difficulty HEENT: normal Neck: supple. no JVD. Carotids 2+ bilat; no bruits. No lymphadenopathy or thryomegaly appreciated. Cor: PMI nondisplaced. Regular rate & rhythm. No rubs, gallops or murmurs. Lungs: clear Abdomen: obese, soft, nontender, nondistended. No hepatosplenomegaly. No bruits or masses. Good bowel sounds. Extremities: no cyanosis, clubbing, rash, edema Neuro: alert & orientedx3, cranial nerves grossly intact. moves all 4 extremities w/o difficulty. Affect obese pleasant   Assessment & Plan: 1. H/O PEA arrest: Due to severe hypoxemia in 5/20 in setting of CHF exacerbation.   2. Chronic systolic CHF: Suspect ischemic cardiomyopathy.  Medtronic ICD.  Echo in 1/20 with EF 40-45%, RV moderately dilated, PASP 71 mmHg.  Echo in 5/20 with lower EF, 30-35%, and mildly dilated RV.  Suspect significant RV failure, may be related to OHS/OSA and COPD. She was massively volume overloaded during 5/20 admission and diuresed extensively.  RHC done 11/06/18 showed high cardiac  output and minimally elevated filling pressures after diuresis.  PVR was not elevated, appeared to be high output PH.  RHC was done again in 9/20, filling pressures were elevated with preserved cardiac output.  Echo in 10/20 showed EF still 30-35%.  Echo in 2/23 with EF 30-35% RV normal.  NYHA III, chronically. Volume status stable. Medtronic interrogation stable: Impedance up. Activity ~ 1 HOUR.  FLUID INDEX STABLE.  - Continue torsemide from 100 mg /50 qpm.  - Continue Jardiance 10 mg daily - Continue Bidil 1 tab tid.   - Continue spironolactone 25 mg daily. - Continue entresto to 49/51 bid - Off bisoprolol with bradycardia and junctional rhythm. Will not add back today. - Check BMET  3. CKD: Stage III- Check BMET  4. COPD: On home oxygen at baseline.  Continues to smoke a few cigarettes.  5. OHS/OSA: On home oxygen at all times and wears Bipap at night. She is followed by Pulmonary. 6. CAD: History of PCI, most recently had inferior STEMI in 5/15 with DES to RCA.  Coronary angiography was done in 9/20, there was 75% ostial D1 stenosis but no good target for PCI. No chest pain.   - Continue ASA  81 and Crestor 40 daily. Good lipids in 2/23.  7. Remote PE: She has not been anticoagulated.  8. Anemia: History of GI bleeding, had endoscopies earlier in 2021 with no bleeding source found.  Also with history of B12 deficiency. She has seen Dr. Lorenso Courier for this. 9. Pulmonary hypertension: Pulmonary venous hypertension on last RHC in 9/20.  10. Obesity: Body mass index is 48.3 kg/m. Weight coming down.   - Continue semaglutide.  11. Atrial fibrillation: ?AF episodes by device interrogation in early summer, not recently.  Saw EP.  SR with PACs.   Follow up in 3 months.  Check BMET  Linnae Rasool NNP-C  05/28/2022

## 2022-06-08 ENCOUNTER — Encounter (HOSPITAL_COMMUNITY): Payer: Self-pay

## 2022-06-08 ENCOUNTER — Other Ambulatory Visit (HOSPITAL_COMMUNITY): Payer: Self-pay | Admitting: Cardiology

## 2022-06-08 DIAGNOSIS — J302 Other seasonal allergic rhinitis: Secondary | ICD-10-CM

## 2022-06-09 ENCOUNTER — Other Ambulatory Visit: Payer: Self-pay | Admitting: Endocrinology

## 2022-06-09 ENCOUNTER — Other Ambulatory Visit (HOSPITAL_COMMUNITY): Payer: Self-pay | Admitting: Cardiology

## 2022-06-09 DIAGNOSIS — J302 Other seasonal allergic rhinitis: Secondary | ICD-10-CM

## 2022-06-15 ENCOUNTER — Other Ambulatory Visit: Payer: Self-pay | Admitting: Internal Medicine

## 2022-06-15 NOTE — Telephone Encounter (Signed)
Requested medication (s) are due for refill today: yes   Requested medication (s) are on the active medication list: yes   Last refill:  04/15/22 #90 0 refills   Future visit scheduled: no   Notes to clinic:  no remains remain. Do you want to refill Rx?     Requested Prescriptions  Pending Prescriptions Disp Refills   ASPIRIN LOW DOSE 81 MG tablet [Pharmacy Med Name: ASPIRIN LOW DOSE 81 MG ORAL TABLET DELAYED RELEASE] 90 tablet 0    Sig: TAKE ONE TABLET BY MOUTH ONCE DAILY     Analgesics:  NSAIDS - aspirin Failed - 06/15/2022 10:17 AM      Failed - Cr in normal range and within 360 days    Creatinine  Date Value Ref Range Status  12/20/2019 2.34 (H) 0.44 - 1.00 mg/dL Final   Creat  Date Value Ref Range Status  07/08/2015 0.89 0.50 - 1.05 mg/dL Final   Creatinine, Ser  Date Value Ref Range Status  05/28/2022 2.03 (H) 0.44 - 1.00 mg/dL Final   Creatinine, POC  Date Value Ref Range Status  10/06/2016 300 mg/dL Final   Creatinine,U  Date Value Ref Range Status  02/17/2021 56.2 mg/dL Final   Creatinine, Urine  Date Value Ref Range Status  05/29/2018 282.47 mg/dL Final    Comment:    Performed at [facility], [place] [address] [place], [place] [address]         Passed - eGFR is 10 or above and within 360 days    GFR, Est African American  Date Value Ref Range Status  07/08/2015 84 >=60 mL/min Final   GFR, Est AFR Am  Date Value Ref Range Status  12/20/2019 25 (L) >60 mL/min Final   GFR calc Af Amer  Date Value Ref Range Status  12/25/2019 23 (L) >60 mL/min Final   GFR, Est Non African American  Date Value Ref Range Status  07/08/2015 73 >=60 mL/min Final    Comment:      The estimated GFR is a calculation valid for adults (>=29 years old) that uses the CKD-EPI algorithm to adjust for age and sex. It is   not to be used for children, pregnant women, hospitalized patients,    patients on dialysis, or with rapidly changing kidney  function. According to the NKDEP, eGFR >89 is normal, 60-89 shows mild impairment, 30-59 shows moderate impairment, 15-29 shows severe impairment and <15 is ESRD.      GFR, Estimated  Date Value Ref Range Status  05/28/2022 27 (L) >60 mL/min Final    Comment:    (NOTE) Calculated using the CKD-EPI Creatinine Equation (2021)   12/20/2019 22 (L) >60 mL/min Final   GFR  Date Value Ref Range Status  07/29/2021 22.00 (L) >60.00 mL/min Final    Comment:    Calculated using the CKD-EPI Creatinine Equation (2021)         Passed - Patient is not pregnant      Passed - Valid encounter within last 12 months    Recent Outpatient Visits           6 months ago Establishing care with new doctor, encounter for   [place], MD   2 years ago Type 2 diabetes mellitus with diabetic polyneuropathy, with long-term current use of insulin [facility])   [place], MD   2 years ago Chronic systolic CHF (congestive  heart failure) [facility])   [place] Karle Plumber B, MD   2 years ago Chronic systolic CHF (congestive heart failure) [facility])   [place] Karle Plumber B, MD   3 years ago Uncontrolled type 2 diabetes mellitus with peripheral neuropathy [facility])   [place] [facility] Ladell Pier, MD

## 2022-06-17 ENCOUNTER — Telehealth: Payer: Self-pay | Admitting: Emergency Medicine

## 2022-06-17 DIAGNOSIS — E1142 Type 2 diabetes mellitus with diabetic polyneuropathy: Secondary | ICD-10-CM

## 2022-06-17 NOTE — Addendum Note (Signed)
Addended by: Karle Plumber B on: 06/17/2022 06:06 PM   Modules accepted: Orders

## 2022-06-17 NOTE — Telephone Encounter (Signed)
Called & spoke to the patient. Verified name & DOB. Patient stated that she would like a referral to a different endocrinologist within [facility] due to that office being "All booked up" and scheduling the patient at the sister location in [place]. Please advise.

## 2022-06-17 NOTE — Telephone Encounter (Signed)
Copied from [place] [id number]. Topic: Referral - Request for Referral >> Jun 16, 2022  4:02 PM Oley Balm A wrote: Reason for CRM: Pt is requesting a Endocrinology doctor for her diabetes   Has patient seen PCP for this complaint? Yes.   *If NO, is insurance requiring patient see PCP for this issue before PCP can refer them? Referral for which specialty: Endocrinology Preferred provider/office: Pt is wanting PCP to request doctor Reason for referral: diabetes

## 2022-06-17 NOTE — Telephone Encounter (Signed)
Will refer to [facility].

## 2022-06-21 ENCOUNTER — Ambulatory Visit: Payer: Medicaid Other | Attending: Cardiology

## 2022-06-21 ENCOUNTER — Other Ambulatory Visit: Payer: Self-pay | Admitting: Internal Medicine

## 2022-06-23 ENCOUNTER — Telehealth: Payer: Self-pay

## 2022-06-23 NOTE — Telephone Encounter (Signed)
Pt called requesting a refill on her Tresiba insulin and a call back. Pt dismissed from practice.

## 2022-06-23 NOTE — Telephone Encounter (Signed)
Refusal sent

## 2022-06-28 ENCOUNTER — Other Ambulatory Visit: Payer: Self-pay | Admitting: Internal Medicine

## 2022-06-28 ENCOUNTER — Other Ambulatory Visit: Payer: Self-pay | Admitting: Endocrinology

## 2022-06-28 ENCOUNTER — Other Ambulatory Visit: Payer: Self-pay | Admitting: Pulmonary Disease

## 2022-06-28 DIAGNOSIS — E1165 Type 2 diabetes mellitus with hyperglycemia: Secondary | ICD-10-CM

## 2022-06-28 NOTE — Telephone Encounter (Signed)
Patient needs an appt. Last ov was 12/2020

## 2022-06-28 NOTE — Progress Notes (Signed)
No ICM remote transmission received for 06/21/2022 and next ICM transmission scheduled for 07/05/2022.

## 2022-06-29 NOTE — Telephone Encounter (Signed)
Requested medication (s) are due for refill today: routing for review  Requested medication (s) are on the active medication list: yes  Last refill:  01/29/22  Future visit scheduled: yes  Notes to clinic:  Unable to refill per protocol due to failed labs, no updated results.      Requested Prescriptions  Pending Prescriptions Disp Refills   OZEMPIC, 2 MG/DOSE, 8 MG/3ML SOPN [Pharmacy Med Name: OZEMPIC (2 MG/DOSE) 8 MG/3ML SUBCUTANEOUS SOLUTION PEN-INJECTOR] 3 mL     Sig: INJECT 2 MG INTO THE SKIN ONCE A WEEK.     Endocrinology:  Diabetes - GLP-1 Receptor Agonists - semaglutide Failed - 06/28/2022 11:10 AM      Failed - HBA1C in normal range and within 180 days    HbA1c, POC (prediabetic range)  Date Value Ref Range Status  01/23/2018 8 (A) 5.7 - 6.4 % Final   HbA1c, POC (controlled diabetic range)  Date Value Ref Range Status  01/19/2019 8.8 (A) 0.0 - 7.0 % Final   Hgb A1c MFr Bld  Date Value Ref Range Status  11/12/2021 7.3 (H) 4.6 - 6.5 % Final    Comment:    Glycemic Control Guidelines for People with Diabetes:Non Diabetic:  <6%Goal of Therapy: <7%Additional Action Suggested:  >8%          Failed - Cr in normal range and within 360 days    Creatinine  Date Value Ref Range Status  12/20/2019 2.34 (H) 0.44 - 1.00 mg/dL Final   Creat  Date Value Ref Range Status  07/08/2015 0.89 0.50 - 1.05 mg/dL Final   Creatinine, Ser  Date Value Ref Range Status  05/28/2022 2.03 (H) 0.44 - 1.00 mg/dL Final   Creatinine, POC  Date Value Ref Range Status  10/06/2016 300 mg/dL Final   Creatinine,U  Date Value Ref Range Status  02/17/2021 56.2 mg/dL Final   Creatinine, Urine  Date Value Ref Range Status  05/29/2018 282.47 mg/dL Final    Comment:    Performed at [facility], [place] [address], [place], [place] [address]         Failed - Valid encounter within last 6 months    Recent Outpatient Visits           6 months ago Establishing care with new  doctor, encounter for   [place] Karle Plumber B, MD   2 years ago Type 2 diabetes mellitus with diabetic polyneuropathy, with long-term current use of insulin [facility])   [place] Karle Plumber B, MD   2 years ago Chronic systolic CHF (congestive heart failure) [facility])   [place] Karle Plumber B, MD   2 years ago Chronic systolic CHF (congestive heart failure) [facility])   [place] Karle Plumber B, MD   3 years ago Uncontrolled type 2 diabetes mellitus with peripheral neuropathy [facility])   [place], MD       Future Appointments             In 2 months Wynetta Emery, Dalbert Batman, MD [place]

## 2022-07-05 ENCOUNTER — Other Ambulatory Visit: Payer: Self-pay | Admitting: Pulmonary Disease

## 2022-07-05 ENCOUNTER — Other Ambulatory Visit: Payer: Self-pay | Admitting: Endocrinology

## 2022-07-05 ENCOUNTER — Other Ambulatory Visit: Payer: Self-pay | Admitting: Internal Medicine

## 2022-07-06 ENCOUNTER — Ambulatory Visit: Payer: Medicaid Other | Admitting: Internal Medicine

## 2022-07-07 ENCOUNTER — Telehealth: Payer: Self-pay | Admitting: Emergency Medicine

## 2022-07-07 ENCOUNTER — Telehealth: Payer: Self-pay | Admitting: Internal Medicine

## 2022-07-07 ENCOUNTER — Ambulatory Visit: Payer: Self-pay

## 2022-07-07 MED ORDER — INSULIN DEGLUDEC 100 UNIT/ML ~~LOC~~ SOLN: 80.0000 [IU] | Freq: Every day | SUBCUTANEOUS | 0 refills | Status: DC

## 2022-07-07 MED ORDER — INSULIN DEGLUDEC 100 UNIT/ML ~~LOC~~ SOLN: 80.0000 [IU] | Freq: Every day | SUBCUTANEOUS | 2 refills | Status: DC

## 2022-07-07 NOTE — Telephone Encounter (Signed)
Patient states that pharmacy faxed over a form to be signed by the provider before they can fill the prescription for insurance purposes. Patient is waiting for medication at the pharmacy.

## 2022-07-07 NOTE — Telephone Encounter (Signed)
  Chief Complaint: medication refill Symptoms: BS elevated, feeling tired and fatigued and mood swings as well.  Frequency: 8 days out of [place]  Pertinent Negatives: NA Disposition: '[]'$ ED /'[]'$ Urgent Care (no appt availability in office) / '[]'$ Appointment(In office/virtual)/ '[]'$  [facility]/ '[]'$ Home Care/ '[]'$ Refused Recommended Disposition /'[]'$ [facility]/ '[x]'$  Follow-up with PCP Additional Notes: pt has scheduled NPA with new Endocrinology on 09/17/22, this was first available. Pt has been out of [place] for 8 days and CBGs have been elevated. This morning was 242 and just checked now and was 153. Pt doesn't want to go to hospital d/t not having medications so she is asking if Dr. Wynetta Emery can refill until upcoming appt.. has FU appt with her on 08/31/22 as well. Advised pt I have sent message HP back to provider and will see if someone can FU with her today.   Summary: Rx refill requested / blood sugar has been between 240 and 275   Pt stated she has made the appointment with Endocrinology and was given the first available date of 04/19. However, a refill is needed on the medication Insulin Degludec (TRESIBA [place]) .Pt stated she was advised to reach PCP to see if she would refill until she was able to get her medication. Stated her blood sugar has been between 240 and 275 without her medication is feeling very tired and is having mood swings.  Pt seeking clinical advice.         Reason for Disposition  [1] Prescription refill request for ESSENTIAL medicine (i.e., likelihood of harm to patient if not taken) AND [2] triager unable to refill per department policy  Answer Assessment - Initial Assessment Questions 1. DRUG NAME: "What medicine do you need to have refilled?"     Tresiba  2. REFILLS REMAINING: "How many refills are remaining?" (Note: The label on the medicine or pill bottle will show how many refills are remaining. If there are no refills remaining, then a renewal may be  needed.)     0 4. PRESCRIBING HCP: "Who prescribed it?" Reason: If prescribed by specialist, call should be referred to that group.     Dr. Wynetta Emery  5. SYMPTOMS: "Do you have any symptoms?"    242 this morning, 153 now BS elevated d/t being out of medication for 8 days, feeling tired and having mood swings  Protocols used: Medication Refill and Renewal Call-A-AH

## 2022-07-07 NOTE — Addendum Note (Signed)
Addended by: Evelena Asa F on: 07/07/2022 11:10 AM   Modules accepted: Orders

## 2022-07-07 NOTE — Progress Notes (Signed)
No ICM remote transmission received for 07/05/2022 and next ICM transmission scheduled for 08/02/2022.

## 2022-07-07 NOTE — Telephone Encounter (Addendum)
Patient given appointment on  08/03/2022 with PCP  to follow-up with glucose levels. Tamara Silva refilled until appointment. Patient advised to keep appointment so that she will be able to get refills  medication. Patient voiced understanding of all discussed .

## 2022-07-07 NOTE — Telephone Encounter (Signed)
Copied from [place] [id number]. Topic: General - Other >> Jul 07, 2022  4:21 PM Sabas Sous wrote: Reason for CRM: Pt called requesting to speak to Cassandra today regarding a request she needs before the pharmacy closes at 6 she says  Best contact: [phone]

## 2022-07-07 NOTE — Telephone Encounter (Signed)
Requested medication (s) are due for refill today: yes  Requested medication (s) are on the active medication list: yes  Last refill:  03/29/22   Future visit scheduled: yes 08/31/22  Notes to clinic:  failed d/t labs, pt was prescribed by Dr. Dwyane Dee for this rx but no longer seeing him and has new Endo appt on 09/17/22, pt is asking if she can get refill until this appt day. Also has FU with Dr. Wynetta Emery on 08/31/22.    Requested Prescriptions  Pending Prescriptions Disp Refills   Insulin Degludec (TRESIBA) 100 UNIT/ML SOLN      Sig: Inject 80 Units into the skin daily in the afternoon.     Endocrinology:  Diabetes - Insulins Failed - 07/07/2022 10:05 AM      Failed - HBA1C is between 0 and 7.9 and within 180 days    HbA1c, POC (prediabetic range)  Date Value Ref Range Status  01/23/2018 8 (A) 5.7 - 6.4 % Final   HbA1c, POC (controlled diabetic range)  Date Value Ref Range Status  01/19/2019 8.8 (A) 0.0 - 7.0 % Final   Hgb A1c MFr Bld  Date Value Ref Range Status  11/12/2021 7.3 (H) 4.6 - 6.5 % Final    Comment:    Glycemic Control Guidelines for People with Diabetes:Non Diabetic:  <6%Goal of Therapy: <7%Additional Action Suggested:  >8%          Failed - Valid encounter within last 6 months    Recent Outpatient Visits           6 months ago Establishing care with new doctor, encounter for   [place], MD   2 years ago Type 2 diabetes mellitus with diabetic polyneuropathy, with long-term current use of insulin ([place])   [place] Karle Plumber B, MD   2 years ago Chronic systolic CHF (congestive heart failure) [facility])   [place] Karle Plumber B, MD   2 years ago Chronic systolic CHF (congestive heart failure) [facility])   [place] Karle Plumber B, MD   3 years ago Uncontrolled type 2 diabetes mellitus with  peripheral neuropathy [facility])   [place], MD       Future Appointments             In 1 month Wynetta Emery, Dalbert Batman, MD [place]

## 2022-07-08 ENCOUNTER — Telehealth: Payer: Self-pay | Admitting: Adult Health

## 2022-07-08 ENCOUNTER — Other Ambulatory Visit: Payer: Self-pay | Admitting: Internal Medicine

## 2022-07-08 ENCOUNTER — Telehealth: Payer: Self-pay | Admitting: Pulmonary Disease

## 2022-07-08 DIAGNOSIS — Z794 Long term (current) use of insulin: Secondary | ICD-10-CM

## 2022-07-08 DIAGNOSIS — J449 Chronic obstructive pulmonary disease, unspecified: Secondary | ICD-10-CM

## 2022-07-08 MED ORDER — ALBUTEROL SULFATE (2.5 MG/3ML) 0.083% IN NEBU: 2.5000 mg | INHALATION_SOLUTION | Freq: Four times a day (QID) | RESPIRATORY_TRACT | 0 refills | Status: AC | PRN

## 2022-07-08 MED ORDER — TRESIBA FLEXTOUCH 200 UNIT/ML ~~LOC~~ SOPN: 80.0000 [IU] | PEN_INJECTOR | Freq: Every day | SUBCUTANEOUS | 3 refills | Status: DC

## 2022-07-08 NOTE — Addendum Note (Signed)
Addended by: Daisy Blossom, Annie Main L on: 07/08/2022 11:36 AM   Modules accepted: Orders

## 2022-07-08 NOTE — Telephone Encounter (Signed)
Requested Prescriptions  Pending Prescriptions Disp Refills   Continuous Blood Gluc Sensor (FREESTYLE LIBRE 2 SENSOR) MISC [Pharmacy Med Name: FREESTYLE LIBRE 2 SENSOR] 2 each 0    Sig: USE ONE SENSOR EVERY 14 (FOURTEEN) DAYS.     Endocrinology: Diabetes - Testing Supplies Passed - 07/08/2022 12:00 PM      Passed - Valid encounter within last 12 months    Recent Outpatient Visits           6 months ago Establishing care with new doctor, encounter for   [place] Karle Plumber B, MD   2 years ago Type 2 diabetes mellitus with diabetic polyneuropathy, with long-term current use of insulin [facility])   [place] Karle Plumber B, MD   2 years ago Chronic systolic CHF (congestive heart failure) [facility])   [place] Karle Plumber B, MD   2 years ago Chronic systolic CHF (congestive heart failure) [facility])   [place] Karle Plumber B, MD   3 years ago Uncontrolled type 2 diabetes mellitus with peripheral neuropathy [facility])   [place], MD       Future Appointments             In 3 weeks Ladell Pier, MD [place]   In 1 month Wynetta Emery, Dalbert Batman, MD [place]

## 2022-07-08 NOTE — Telephone Encounter (Signed)
The patient called back in inquiring if the prior authorization has been done and to speak with Cassandra. Please assist patient further.

## 2022-07-08 NOTE — Telephone Encounter (Signed)
Patient called after hours for refill of albuterol neb, rx sent in no refillls Has to have ov before next refill  Last seen 2 yr ago Ov later this month on sched.

## 2022-07-08 NOTE — Telephone Encounter (Signed)
Patient called stating that her insurance is requiring pre-authorization for Tresiba 100 Mg/mL. Spoke with patient pharmacist, Pharmacist voicing that patient insurance will refill Tresiba 200 mg / ML. Routing to [facility] pharmacist for guidance.

## 2022-07-08 NOTE — Telephone Encounter (Signed)
New script has been sent and patient is aware.

## 2022-07-09 ENCOUNTER — Encounter: Payer: Self-pay | Admitting: Cardiology

## 2022-07-09 MED ORDER — IPRATROPIUM-ALBUTEROL 0.5-2.5 (3) MG/3ML IN SOLN: 3.0000 mL | Freq: Four times a day (QID) | RESPIRATORY_TRACT | 0 refills | Status: DC | PRN

## 2022-07-09 NOTE — Telephone Encounter (Signed)
Patient states wrong Nebulizer solution was called into pharmacy. Needs Duoneb called into pharmacy, Pharmacy is [facility]. Patient phone number is [phone].

## 2022-07-09 NOTE — Telephone Encounter (Signed)
Rx for duoneb has been sent to pharmacy for pt. Called and spoke with pt letting her know this had been done and stated to pt to make sure she kept upcoming appt in order to be able to receive further refills. Pt verbalized understanding. Nothing further needed.

## 2022-07-12 ENCOUNTER — Telehealth: Payer: Self-pay | Admitting: Internal Medicine

## 2022-07-12 ENCOUNTER — Other Ambulatory Visit: Payer: Self-pay | Admitting: Internal Medicine

## 2022-07-12 ENCOUNTER — Telehealth: Payer: Self-pay | Admitting: Pulmonary Disease

## 2022-07-12 DIAGNOSIS — K219 Gastro-esophageal reflux disease without esophagitis: Secondary | ICD-10-CM

## 2022-07-12 DIAGNOSIS — J449 Chronic obstructive pulmonary disease, unspecified: Secondary | ICD-10-CM

## 2022-07-12 MED ORDER — OMEPRAZOLE 40 MG PO CPDR: 40.0000 mg | DELAYED_RELEASE_CAPSULE | Freq: Every day | ORAL | 0 refills | Status: DC

## 2022-07-12 MED ORDER — IPRATROPIUM-ALBUTEROL 0.5-2.5 (3) MG/3ML IN SOLN: 3.0000 mL | Freq: Four times a day (QID) | RESPIRATORY_TRACT | 0 refills | Status: DC | PRN

## 2022-07-12 NOTE — Telephone Encounter (Signed)
Copied from [place] [id number]. Topic: General - Other >> Jul 12, 2022  3:24 PM Everette C wrote: Reason for CRM: The patient has called to follow up on their request for omeprazole (PRILOSEC) 40 MG capsule [id number]  Please contact the patient further when possible

## 2022-07-12 NOTE — Addendum Note (Signed)
Addended by: Karle Plumber B on: 07/12/2022 06:28 PM   Modules accepted: Orders

## 2022-07-12 NOTE — Telephone Encounter (Signed)
Called and spoke with patient. Patient verified pharmacy. Rx has been sent to the patients pharmacy.   Nothing further needed.

## 2022-07-12 NOTE — Telephone Encounter (Signed)
Pt is wanting a message to go back to PCP. Pt states that she is needing her medication so she can eat. Per pt she is out of medication.  Pt was advised that the refill request was sent over from the Pharmacy. Per pt the pharmacy told her to call her PCP so she can get the medication today.  [place], [place] Phone: [phone]  Fax: [phone]      omeprazole (PRILOSEC) 40 MG capsule

## 2022-07-13 ENCOUNTER — Telehealth: Payer: Self-pay | Admitting: Adult Health

## 2022-07-13 NOTE — Telephone Encounter (Signed)
Called but unable to LVM due to VM not set up. Please inform patient that a refill has been sent to the pharmacy.

## 2022-07-13 NOTE — Telephone Encounter (Signed)
Requested Prescriptions  Refused Prescriptions Disp Refills   omeprazole (PRILOSEC) 40 MG capsule [Pharmacy Med Name: OMEPRAZOLE 40 MG ORAL CAPSULE DELAYED RELEASE] 30 capsule 0    Sig: TAKE 1 CAPSULE (40 MG TOTAL) BY MOUTH DAILY.     Gastroenterology: Proton Pump Inhibitors Passed - 07/12/2022 11:40 AM      Passed - Valid encounter within last 12 months    Recent Outpatient Visits           7 months ago Establishing care with new doctor, encounter for   [place] Karle Plumber B, MD   2 years ago Type 2 diabetes mellitus with diabetic polyneuropathy, with long-term current use of insulin [facility])   [place] Karle Plumber B, MD   2 years ago Chronic systolic CHF (congestive heart failure) [facility])   [place] Karle Plumber B, MD   2 years ago Chronic systolic CHF (congestive heart failure) [facility])   [place] Karle Plumber B, MD   3 years ago Uncontrolled type 2 diabetes mellitus with peripheral neuropathy [facility])   [place], MD       Future Appointments             In 3 weeks Ladell Pier, MD [place]

## 2022-07-13 NOTE — Telephone Encounter (Signed)
Called & spoke to the patient. Verifie dname & DOB. Informed that medication refill was sent to the pharmacy. Patient stated that she has already picked up the refill. Confirmed next appointment.

## 2022-07-14 NOTE — Telephone Encounter (Signed)
Pt has an upcoming visit 2/22. This can be discussed during that visit. Pt has been made aware. Nothing further needed.

## 2022-07-20 ENCOUNTER — Ambulatory Visit (INDEPENDENT_AMBULATORY_CARE_PROVIDER_SITE_OTHER): Payer: Medicaid Other

## 2022-07-20 ENCOUNTER — Telehealth: Payer: Self-pay

## 2022-07-20 DIAGNOSIS — I469 Cardiac arrest, cause unspecified: Secondary | ICD-10-CM

## 2022-07-20 LAB — CUP PACEART REMOTE DEVICE CHECK
Battery Remaining Longevity: 114 mo
Battery Voltage: 3.01 V
Brady Statistic RV Percent Paced: 0.01 %
Date Time Interrogation Session: 20240220012304
HighPow Impedance: 56 Ohm
Implantable Lead Connection Status: 753985
Implantable Lead Implant Date: 20201104
Implantable Lead Location: 753860
Implantable Pulse Generator Implant Date: 20201104
Lead Channel Impedance Value: 304 Ohm
Lead Channel Impedance Value: 342 Ohm
Lead Channel Pacing Threshold Amplitude: 0.5 V
Lead Channel Pacing Threshold Pulse Width: 0.4 ms
Lead Channel Sensing Intrinsic Amplitude: 12.875 mV
Lead Channel Sensing Intrinsic Amplitude: 12.875 mV
Lead Channel Setting Pacing Amplitude: 2.5 V
Lead Channel Setting Pacing Pulse Width: 0.4 ms
Lead Channel Setting Sensing Sensitivity: 0.3 mV
Zone Setting Status: 755011
Zone Setting Status: 755011

## 2022-07-20 NOTE — Telephone Encounter (Signed)
Scheduled remote reviewed. Normal device function.   1 AF event, 23hrs in duration, poor rate control.  Event occurred 2/12 Burden 11.9%, ASA only - route to triage Presenting regular VS Next remote 91 days. LA

## 2022-07-20 NOTE — Telephone Encounter (Signed)
Pt aware office will call to arrange EP APP OV to discuss anticoagulation per Dr. Curt Bears. Pt agreeable to plan.

## 2022-07-21 ENCOUNTER — Ambulatory Visit: Payer: Medicaid Other | Attending: Cardiology

## 2022-07-21 DIAGNOSIS — I5042 Chronic combined systolic (congestive) and diastolic (congestive) heart failure: Secondary | ICD-10-CM | POA: Diagnosis not present

## 2022-07-21 DIAGNOSIS — Z95810 Presence of automatic (implantable) cardiac defibrillator: Secondary | ICD-10-CM | POA: Diagnosis not present

## 2022-07-21 NOTE — Progress Notes (Signed)
EPIC Encounter for ICM Monitoring  Patient Name: Tamara Silva is a 62 y.o. female Date: 07/21/2022 Primary Care Physican: Ladell Pier, MD Primary Cardiologist: Aundra Dubin Electrophysiologist: Curt Bears 10/22/2021 Weight:  328 lbs 12/18/2021 Weight: 319 lbs 03/29/2022 Office Weight: 324 lbs 05/28/2022 Office Weight: 313 lbs  Since 12-Jul-2022 Time in AF 2.9 hr/day (11.9%)                                                            Spoke with patient and heart failure questions reviewed.  Transmission results reviewed.  Pt asymptomatic for fluid accumulation.  Reports feeling well at this time and voices no complaints.   She does not take Torsemide as prescribed due to because causes severe leg cramps.   She takes 50 mg Torsemide twice a day.     Optivol thoracic impedance suggesting possible fluid accumulation starting 07/07/2022.    Time in AF has been addressed by device clinic on 2/20 and patient aware to call office to arrange EP visit regarding AF and to discuss anticoagulation.     Prescribed:  Torsemide 100 mg take 1 tablet (100 mg total) by mouth every morning and 0.5 tablet (50 mg total) every evening.  Pt reports 12/12 that she takes AM Torsemide and does not take the evening tablet.   Spironolactone 25 mg take 1 tablet daily Jardiance 10 mg take 1 tablet daily before breakfast   Labs: 05/28/2022 Creatinine 2.03, BUN 33, Potassium 3.8, Sodium 141, GFR 27 03/29/2022 Creatinine 1.63, BUN 30, Potassium 3.7, Sodium 142, GFR 35 10/22/2021 Creatinine 1.83, BUN 17, Potassium 3.8, Sodium 137, GFR 31 10/17/2021 Creatinine 1.81, BUN 39, Potassium 4.7, Sodium 142, GFR 31  10/16/2021 Creatinine 2.59, BUN 62, Potassium 4.4, Sodium 137, GFR 20  10/15/2021 Creatinine 2.32, BUN 54, Potassium 4.2, Sodium 136, GFR 23  A complete set of results can be found in Results Review.   Recommendations:   Advised to take Torsemide as prescribed 100 mg every AM and 50 mg PM.  She stated she Tamara try.      Follow-up plan: ICM clinic phone appointment on 07/26/2022 to recheck fluid levels.   91 day device clinic remote transmission 10/19/2022.     EP/Cardiology Office Visits: 08/02/2022 with Lytle Michaels, [place].    09/27/2022 with HF clinic.  Recall 03/24/2023 with Dr Curt Bears.   Copy of ICM check sent to Dr. Curt Bears.  3 month ICM trend: 07/20/2022.    12-14 Month ICM trend:      [name], RN 07/21/2022 8:24 AM

## 2022-07-22 ENCOUNTER — Encounter: Payer: Self-pay | Admitting: Adult Health

## 2022-07-22 ENCOUNTER — Ambulatory Visit (INDEPENDENT_AMBULATORY_CARE_PROVIDER_SITE_OTHER): Payer: Medicaid Other | Admitting: Adult Health

## 2022-07-22 ENCOUNTER — Ambulatory Visit (INDEPENDENT_AMBULATORY_CARE_PROVIDER_SITE_OTHER): Payer: Medicaid Other

## 2022-07-22 VITALS — BP 118/50 | HR 77 | Temp 97.9°F | Ht 67.5 in | Wt 318.8 lb

## 2022-07-22 DIAGNOSIS — J9612 Chronic respiratory failure with hypercapnia: Secondary | ICD-10-CM

## 2022-07-22 DIAGNOSIS — J449 Chronic obstructive pulmonary disease, unspecified: Secondary | ICD-10-CM

## 2022-07-22 DIAGNOSIS — I5022 Chronic systolic (congestive) heart failure: Secondary | ICD-10-CM

## 2022-07-22 DIAGNOSIS — J9611 Chronic respiratory failure with hypoxia: Secondary | ICD-10-CM

## 2022-07-22 MED ORDER — IPRATROPIUM-ALBUTEROL 0.5-2.5 (3) MG/3ML IN SOLN
3.0000 mL | Freq: Four times a day (QID) | RESPIRATORY_TRACT | 3 refills | Status: DC | PRN
  Filled 2022-10-11 – 2022-10-20 (×3): qty 360, 30d supply, fill #0

## 2022-07-22 MED ORDER — ANORO ELLIPTA 62.5-25 MCG/ACT IN AEPB
1.0000 | INHALATION_SPRAY | Freq: Every day | RESPIRATORY_TRACT | 11 refills | Status: AC
  Filled 2022-10-18: qty 60, 30d supply, fill #0
  Filled 2022-11-16: qty 60, 30d supply, fill #1
  Filled 2022-12-17: qty 60, 30d supply, fill #2
  Filled 2023-02-17: qty 60, 30d supply, fill #3
  Filled 2023-03-30: qty 60, 30d supply, fill #4

## 2022-07-22 NOTE — Patient Instructions (Addendum)
Chest xray today  Continue on ANORO 1 puff daily, rinse after use  Mucinex DM .Twice daily  As needed  Cough/congestion  Albuterol inhaler or Duoneb Neb  every 6hr as needed for wheezing .  Continue on Oxygen 3  l/m .  Continue on  TRILOGY   At bedtime  and with naps.  Low salt diet .   Work on [employer] loss  Do not drive if sleepy.  Follow up with Dr. Hermina Staggers  in 4 months  and As needed   Please contact office for sooner follow up if symptoms do not improve or worsen or seek emergency care

## 2022-07-22 NOTE — Assessment & Plan Note (Addendum)
Severe COPD currently compensated on present maintenance regimen. Check chest x-ray today.  Does not qualify for the lung cancer screening CT program  Plan  Patient Instructions  Chest xray today  Continue on ANORO 1 puff daily, rinse after use  Mucinex DM .Twice daily  As needed  Cough/congestion  Albuterol inhaler or Duoneb Neb  every 6hr as needed for wheezing .  Continue on Oxygen 3  l/m .  Continue on  TRILOGY   At bedtime  and with naps.  Low salt diet .   Work on [employer] loss  Do not drive if sleepy.  Follow up with Dr. Hermina Staggers  in 4 months  and As needed   Please contact office for sooner follow up if symptoms do not improve or worsen or seek emergency care

## 2022-07-22 NOTE — Assessment & Plan Note (Signed)
Healthy weight loss 

## 2022-07-22 NOTE — Assessment & Plan Note (Addendum)
Continue on oxygen maintain O2 saturations greater than 88 to 90%.  Continue trilogy NIV at bedtime and with naps  Plan  Patient Instructions  Chest xray today  Continue on ANORO 1 puff daily, rinse after use  Mucinex DM .Twice daily  As needed  Cough/congestion  Albuterol inhaler or Duoneb Neb  every 6hr as needed for wheezing .  Continue on Oxygen 3  l/m .  Continue on  TRILOGY   At bedtime  and with naps.  Low salt diet .   Work on [employer] loss  Do not drive if sleepy.  Follow up with Dr. Hermina Staggers  in 4 months  and As needed   Please contact office for sooner follow up if symptoms do not improve or worsen or seek emergency care

## 2022-07-22 NOTE — Assessment & Plan Note (Signed)
Appears euvolemic on exam.  Continue follow-up with cardiology and current maintenance regimen

## 2022-07-22 NOTE — Progress Notes (Signed)
$@PatientA$  ID: Tamara Silva, female    DOB: 03/29/1961, 62 y.o.   MRN: [id number]  Chief Complaint  Patient presents with   Follow-up    Referring provider: Ladell Pier, MD  HPI: 62 year old female former smoker followed for COPD, obstructive sleep apnea/obesity hypoventilation syndrome on nocturnal trilogy vent, chronic respiratory failure on oxygen Medical history significant for coronary artery disease, congestive heart failure, PEA arrest in 2020 status post ICD implant, CVA , PE , history of polysubstance abuse-cocaine  TEST/EVENTS :  Sleep study done 11/2014 showed Mild OSA with AHI at 5.7, mild desats ~82%.   Spirometry 12/2014 showed FEV1 at 34%, ratio 66.    2D echo October 2020 EF 30 to 35%   CT chest 06/2015 Lungs/Pleura:  Calcified pleural plaques within the left hemi thorax with associated scarring in the left upper and left lower lobes is grossly stable. No pleural effusion or pneumothorax.  PFTs January 26, 2021 showed severe COPD with FEV1 at 43%, ratio 76, FVC 45%, DLCO 50%.  07/22/2022 Follow up : COPD, OSA/OHS, oxygen dependent respiratory failure Patient presents for a follow-up visit.  Last seen August 2022. Says she has been doing okay, not sure why she did not have a follow up sooner.  Patient has underlying Severe COPD.  She remains on Anoro daily.  Uses DuoNeb up to 4 times a day. Discussed changing Duoneb to Albuterol but she does not like Albuterol. Needs new neb machine as her just broke. Remains on Oxygen 3 L.. No increased Oxygen demands.  Overall get short of breath with prolonged walking.  Is sedentary.  Denies any flare of cough or wheezing. She has obstructive sleep apnea and OHS.  She is on Trilogy vent at bedtime with Oxygen 3l/m.  We have requested a download. Uses with naps. Tries to never go to sleep without Trilogy. Uses full face mask.  Has CHF , remains on Demadex 172m daily .  Says that her leg swelling has been doing well lately.  Says  most days she only takes 100 mg of Demadex.  She follows with cardiology.  Flu shot is utd. Insurance will not cover RSV vaccine currently . PVX is utd.     Allergies  Allergen Reactions   Metolazone Other (See Comments)    Dizziness and falling Per patient in 2021, states she does not think she fell because of this medication.   Ibuprofen Other (See Comments)    Pt states she is not supposed to take ibuprofen because of other meds she is taking   Nsaids Other (See Comments)    "I do not take NSAIDs d/t interfering with other meds"    Immunization History  Administered Date(s) Administered   Influenza,inj,Quad PF,6+ Mos 02/08/2014, 05/22/2015, 06/13/2018, 01/19/2019, 03/25/2021, 03/29/2022   PFIZER(Purple Top)SARS-COV-2 Vaccination 11/14/2019, 12/07/2019, 05/14/2020   Pneumococcal Polysaccharide-23 01/19/2019   Tdap 12/14/2021    Past Medical History:  Diagnosis Date   AICD (automatic cardioverter/defibrillator) present 04/04/2019   Asthma    CAD (coronary artery disease)    Chronic combined systolic (congestive) and diastolic (congestive) heart failure (HCC)    Chronic iron deficiency anemia    CVA (cerebral vascular accident) ([facility] [facility] 05/[place]   Diabetes mellitus without complication ([facility]    DOE (dyspnea on exertion)    Edema of both legs    Hepatic steatosis    Hypertension    Pulmonary emboli ([facility] 2014   Respiratory failure ([facility] 09/2018   Seizure ([facility] [place] 05/2018    Tobacco  History: Social History   Tobacco Use  Smoking Status Some Days   Packs/day: 0.50   Years: 20.00   Total pack years: 10.00   Types: Cigarettes   Last attempt to quit: 05/31/2018   Years since quitting: 4.1  Smokeless Tobacco Never  Tobacco Comments   smoked off and on x 20 years.  Still smokes some when stressed.  No sure how often or how much.  07/22/2022  hfb   Ready to quit: Not Answered Counseling given: Not Answered Tobacco comments: smoked off and on x 20 years.  Still smokes some  when stressed.  No sure how often or how much.  07/22/2022  hfb   Outpatient Medications Prior to Visit  Medication Sig Dispense Refill   ACCU-CHEK AVIVA PLUS test strip USE 1 STRIP TO CHECK BLOOD SUGAR 3 TIMES A DAY . 100 each 0   acetaminophen (TYLENOL) 500 MG tablet Take 1,000 mg by mouth every 6 (six) hours as needed for moderate pain or headache.     ascorbic acid (VITAMIN C) 500 MG tablet TAKE ONE TABLET BY MOUTH ONCE DAILY 90 tablet 3   aspirin EC (ASPIRIN LOW DOSE) 81 MG tablet TAKE ONE TABLET BY MOUTH ONCE DAILY 90 tablet 0   BIDIL 20-37.5 MG tablet TAKE 1 TABLET BY MOUTH 3 (THREE) TIMES DAILY. 270 tablet 3   Blood Glucose Monitoring Suppl (ACCU-CHEK GUIDE) w/Device KIT Use accu chek guide to check blood sugar three times daily. 1 kit 0   Continuous Blood Gluc Sensor (FREESTYLE LIBRE 2 SENSOR) MISC USE ONE SENSOR EVERY 14 (FOURTEEN) DAYS. 2 each 0   D3-1000 25 MCG (1000 UT) tablet Take 1,000 Units by mouth 2 (two) times daily.     empagliflozin (JARDIANCE) 10 MG TABS tablet TAKE ONE TABLET BY MOUTH ONCE DAILY BEFORE BREAKFAST 90 tablet 3   FEROSUL 325 (65 Fe) MG tablet TAKE 1 TABLET (325 MG TOTAL) BY MOUTH DAILY AFTER SUPPER. PLEASE TAKE WITH A SOURCE OF VITAMIN C 90 tablet 3   fluticasone (FLONASE) 50 MCG/ACT nasal spray Place 1 spray into both nostrils daily. 16 g 6   hydrocerin (EUCERIN) CREA Apply 1 application topically 2 (two) times daily.  0   insulin degludec (TRESIBA FLEXTOUCH) 200 UNIT/ML FlexTouch Pen Inject 80 Units into the skin daily. 15 mL 3   Insulin Pen Needle (EASY COMFORT PEN NEEDLES) 31G X 5 MM MISC USE 3 TIMES A DAY FOR INSULIN ADMINISTRATION 100 each 12   NEEDLE, DISP, 26 G 26G X 1/2" MISC 1 application by Does not apply route daily. 100 each 0   omeprazole (PRILOSEC) 40 MG capsule Take 1 capsule (40 mg total) by mouth daily. 30 capsule 0   polyethylene glycol (MIRALAX / GLYCOLAX) 17 g packet Take 17 g by mouth daily as needed. 14 each 6   RESTASIS 0.05 %  ophthalmic emulsion Place 1 drop into both eyes daily as needed (dry eyes).     sacubitril-valsartan (ENTRESTO) 49-51 MG Take 1 tablet by mouth 2 (two) times daily. 90 tablet 3   Semaglutide, 2 MG/DOSE, (OZEMPIC, 2 MG/DOSE,) 8 MG/3ML SOPN INJECT 2 MG INTO THE SKIN ONCE A WEEK. 3 mL 1   senna-docusate (SENOKOT-S) 8.6-50 MG tablet Take 1 tablet by mouth 2 (two) times daily. 30 tablet 0   spironolactone (ALDACTONE) 25 MG tablet Take 25 mg by mouth daily.     torsemide (DEMADEX) 100 MG tablet Take 1 tablet (100 mg total) by mouth in the morning AND  0.5 tablets (50 mg total) every evening. 45 tablet 3   vitamin B-12 (CYANOCOBALAMIN) 1000 MCG tablet Take 1,000 mcg by mouth daily.     ANORO ELLIPTA 62.5-25 MCG/ACT AEPB INHALE 1 PUFF INTO THE LUNGS DAILY. 60 each 0   ipratropium-albuterol (DUONEB) 0.5-2.5 (3) MG/3ML SOLN Take 3 mLs by nebulization every 6 (six) hours as needed. 360 mL 0   albuterol (PROVENTIL) (2.5 MG/3ML) 0.083% nebulizer solution USE ONE VIAL BY NEBULIZATION EVERY 6 (SIX) HOURS AS NEEDED FOR WHEEZING OR SHORTNESS OF BREATH. (Patient not taking: Reported on 07/22/2022) 90 mL 0   albuterol (PROVENTIL) (2.5 MG/3ML) 0.083% nebulizer solution Take 3 mLs (2.5 mg total) by nebulization every 6 (six) hours as needed for wheezing or shortness of breath. (Patient not taking: Reported on 07/22/2022) 120 mL 0   No facility-administered medications prior to visit.     Review of Systems:   Constitutional:   No  weight loss, night sweats,  Fevers, chills, fatigue, or  lassitude.  HEENT:   No headaches,  Difficulty swallowing,  Tooth/dental problems, or  Sore throat,                No sneezing, itching, ear ache, nasal congestion, post nasal drip,   CV:  No chest pain,  Orthopnea, PND, swelling in lower extremities, anasarca, dizziness, palpitations, syncope.   GI  No heartburn, indigestion, abdominal pain, nausea, vomiting, diarrhea, change in bowel habits, loss of appetite, bloody stools.    Resp: .  No chest wall deformity  Skin: no rash or lesions.  GU: no dysuria, change in color of urine, no urgency or frequency.  No flank pain, no hematuria   MS:  No joint pain or swelling.  No decreased range of motion.  No back pain.    Physical Exam  BP (!) 118/50 (BP Location: Right Arm, Patient Position: Sitting, Cuff Size: Large)   Pulse 77   Temp 97.9 F (36.6 C) (Oral)   Ht 5' 7.5" (1.715 m)   Wt (!) 318 lb 12.8 oz (144.6 kg)   LMP 05/30/2013   SpO2 92%   BMI 49.19 kg/m   GEN: A/Ox3; pleasant , NAD, well nourished    HEENT:  [place]/AT,   NOSE-clear, THROAT-clear, no lesions, no postnasal drip or exudate noted.   NECK:  Supple w/ fair ROM; no JVD; normal carotid impulses w/o bruits; no thyromegaly or nodules palpated; no lymphadenopathy.    RESP  Clear  P & A; w/o, wheezes/ rales/ or rhonchi. no accessory muscle use, no dullness to percussion  CARD:  RRR, no m/r/g, tr-1 + peripheral edema, pulses intact, no cyanosis or clubbing.  GI:   Soft & nt; nml bowel sounds; no organomegaly or masses detected.   Musco: Warm bil, no deformities or joint swelling noted.   Neuro: alert, no focal deficits noted.    Skin: Warm, no lesions or rashes    Lab Results:        Imaging:        Latest Ref Rng & Units 01/26/2021   10:56 AM  PFT Results  FVC-Pre L 1.41   FVC-Predicted Pre % 46   FVC-Post L 1.38   FVC-Predicted Post % 45   Pre FEV1/FVC % % 72   Post FEV1/FCV % % 76   FEV1-Pre L 1.01   FEV1-Predicted Pre % 42   FEV1-Post L 1.04   DLCO uncorrected ml/min/mmHg 11.33   DLCO UNC% % 50   DLCO corrected ml/min/mmHg 11.33  DLCO COR %Predicted % 50   DLVA Predicted % 113   TLC L 3.93   TLC % Predicted % 70   RV % Predicted % 115     No results found for: "NITRICOXIDE"      Assessment & Plan:   COPD (chronic obstructive pulmonary disease) (HCC) Severe COPD currently compensated on present maintenance regimen. Check chest x-ray today.  Does  not qualify for the lung cancer screening CT program  Plan  Patient Instructions  Chest xray today  Continue on ANORO 1 puff daily, rinse after use  Mucinex DM .Twice daily  As needed  Cough/congestion  Albuterol inhaler or Duoneb Neb  every 6hr as needed for wheezing .  Continue on Oxygen 3  l/m .  Continue on  TRILOGY   At bedtime  and with naps.  Low salt diet .   Work on [employer] loss  Do not drive if sleepy.  Follow up with Dr. Hermina Staggers  in 4 months  and As needed   Please contact office for sooner follow up if symptoms do not improve or worsen or seek emergency care      Chronic respiratory failure with hypoxia and hypercapnia ([place]) Continue on oxygen maintain O2 saturations greater than 88 to 90%.  Continue trilogy NIV at bedtime and with naps  Plan  Patient Instructions  Chest xray today  Continue on ANORO 1 puff daily, rinse after use  Mucinex DM .Twice daily  As needed  Cough/congestion  Albuterol inhaler or Duoneb Neb  every 6hr as needed for wheezing .  Continue on Oxygen 3  l/m .  Continue on  TRILOGY   At bedtime  and with naps.  Low salt diet .   Work on [employer] loss  Do not drive if sleepy.  Follow up with Dr. Hermina Staggers  in 4 months  and As needed   Please contact office for sooner follow up if symptoms do not improve or worsen or seek emergency care      Chronic systolic (congestive) heart failure ([place]) Appears euvolemic on exam.  Continue follow-up with cardiology and current maintenance regimen  Morbid obesity ([place]) Healthy weight loss     [name], NP 07/22/2022

## 2022-07-22 NOTE — Addendum Note (Signed)
Addended by: Vanessa Barbara on: 07/22/2022 11:38 AM   Modules accepted: Orders

## 2022-07-23 ENCOUNTER — Other Ambulatory Visit: Payer: Self-pay | Admitting: *Deleted

## 2022-07-23 DIAGNOSIS — J449 Chronic obstructive pulmonary disease, unspecified: Secondary | ICD-10-CM

## 2022-07-23 NOTE — Progress Notes (Signed)
Called and spoke with patient, advised of results/recommendations per [name] NP.  She was agreeable tot he HRCT scan.  She verbalized understanding.  Nothing further needed.

## 2022-07-26 ENCOUNTER — Ambulatory Visit: Payer: Medicaid Other | Attending: Cardiology

## 2022-07-26 DIAGNOSIS — I5042 Chronic combined systolic (congestive) and diastolic (congestive) heart failure: Secondary | ICD-10-CM

## 2022-07-26 DIAGNOSIS — Z95810 Presence of automatic (implantable) cardiac defibrillator: Secondary | ICD-10-CM

## 2022-07-27 NOTE — Progress Notes (Signed)
Electrophysiology Office Note Date: 08/02/2022  ID:  Tamara Silva, DOB 1960-11-13, MRN [id number]  PCP: Ladell Pier, MD Primary Cardiologist: Fransico Him, MD Electrophysiologist: Constance Haw, MD   CC: Routine ICD follow-up  Tamara Silva is a 62 y.o. female with PMH CAD, CHF, CVA, Medtronic Single Chamber ICD, PE, DM2, and seizures seen today for acute visit due to AF found on device report.    Patient reports doing well after taking a prn torsemide yesterday. She can only stand to take torsemide 1-2 times a week. She has severe hand and feet cramping if she takes more than 50 mg at one time, or for multiple days. She takes the torsemide when she feels like she is getting more SOB and her legs are "getting tight". she denies chest pain, palpitations, PND, orthopnea, nausea, vomiting, dizziness, syncope, weight gain, or early satiety.   She has not had ICD shocks.   Device History: Medtronic Single Chamber ICD implanted 2020 for CHF  Current Outpatient Medications  Medication Instructions   ACCU-CHEK AVIVA PLUS test strip USE 1 STRIP TO CHECK BLOOD SUGAR 3 TIMES A DAY .   acetaminophen (TYLENOL) 1,000 mg, Oral, Every 6 hours PRN   albuterol (PROVENTIL) (2.5 MG/3ML) 0.083% nebulizer solution USE ONE VIAL BY NEBULIZATION EVERY 6 (SIX) HOURS AS NEEDED FOR WHEEZING OR SHORTNESS OF BREATH.   albuterol (PROVENTIL) 2.5 mg, Nebulization, Every 6 hours PRN   ascorbic acid (VITAMIN C) 500 MG tablet TAKE ONE TABLET BY MOUTH ONCE DAILY   Aspirin Low Dose 81 mg, Oral, Daily   BIDIL 20-37.5 MG tablet 1 tablet, Oral, 3 times daily   Blood Glucose Monitoring Suppl (ACCU-CHEK GUIDE) w/Device KIT Use accu chek guide to check blood sugar three times daily.    Continuous Blood Gluc Sensor (FREESTYLE LIBRE 2 SENSOR) MISC USE ONE SENSOR EVERY 14 (FOURTEEN) DAYS.   cyanocobalamin (VITAMIN B12) 1,000 mcg, Oral, Daily   D3-1000 1,000 Units, Oral, 2 times daily   empagliflozin (JARDIANCE)  10 MG TABS tablet TAKE ONE TABLET BY MOUTH ONCE DAILY BEFORE BREAKFAST   FEROSUL 325 (65 Fe) MG tablet TAKE 1 TABLET (325 MG TOTAL) BY MOUTH DAILY AFTER SUPPER. PLEASE TAKE WITH A SOURCE OF VITAMIN C   fluticasone (FLONASE) 50 MCG/ACT nasal spray 1 spray, Each Nare, Daily   hydrocerin (EUCERIN) CREA 1 application , Topical, 2 times daily   Insulin Pen Needle (EASY COMFORT PEN NEEDLES) 31G X 5 MM MISC USE 3 TIMES A DAY FOR INSULIN ADMINISTRATION   ipratropium-albuterol (DUONEB) 0.5-2.5 (3) MG/3ML SOLN 3 mLs, Nebulization, Every 6 hours PRN   NEEDLE, DISP, 26 G 26G X 1/2" MISC 1 application , Does not apply, Daily   omeprazole (PRILOSEC) 40 mg, Oral, Daily   polyethylene glycol (MIRALAX / GLYCOLAX) 17 g, Oral, Daily PRN   RESTASIS 0.05 % ophthalmic emulsion 1 drop, Both Eyes, Daily PRN   sacubitril-valsartan (ENTRESTO) 49-51 MG 1 tablet, Oral, 2 times daily   Semaglutide, 2 MG/DOSE, (OZEMPIC, 2 MG/DOSE,) 8 MG/3ML SOPN INJECT 2 MG INTO THE SKIN ONCE A WEEK.   senna-docusate (SENOKOT-S) 8.6-50 MG tablet 1 tablet, Oral, 2 times daily   spironolactone (ALDACTONE) 25 mg, Oral, Daily   torsemide (DEMADEX) 100 MG tablet Take 1 tablet (100 mg total) by mouth in the morning AND 0.5 tablets (50 mg total) every evening.   Tresiba FlexTouch 80 Units, Subcutaneous, Daily   umeclidinium-vilanterol (ANORO ELLIPTA) 62.5-25 MCG/ACT AEPB 1 puff, Inhalation, Daily   Physical Exam:  Vitals:   08/02/22 0847  BP: (!) 118/58  Pulse: 75  SpO2: 96%  Weight: (!) 320 lb (145.2 kg)  Height: 5' 7.5" (1.715 m)     GEN- NAD. A&O x 3. Normal affect. HEENT: Normocephalic, atraumatic Lungs- CTAB, Normal effort.  Heart- Regular rate and rhythm rate and rhythm. No M/G/R.  Extremities- No peripheral edema. no clubbing or cyanosis  Other studies Reviewed: Previous EP office notes.   ICD interrogation- reviewed in detail today,  See PACEART report  EKG is not ordered today. EKG from 02/2022 reviewed which showed NSR at  69 bpm.  She is in NSR on device interrogation (Visia AF ICD, which has same monitoring algorithm as loop recorder)  Assessment and Plan:  1.  Chronic systolic dysfunction s/p Medtronic single chamber ICD  euvolemic today after taking prn torsemide yesterday. She says she takes 50 mg torsemide 1-2 times a week. Any more often and she has severe cramping. ? If a lower dose of more regular furosemide would keep her at a lower baseline.  Stable on an appropriate medical regimen Normal ICD function See Pace Art report No changes today Continue GDMT per HF team.   2. Paroxysmal atrial fibrillation 23 hr episode noted on her device.  CHA2DS2/VASc is at least 7 given h/o CVA and PE Recommend [place] and pt is agreeable.  Start Eliquis 5 mg BID, labs today. WIll give co-pay card and also documents to work on for pt assistance.  3. CKD III-IV Baseline Cr appears 1.8-2.0  4. CAD Denies s/s ischemia   Disposition:   Follow up with EP APP in 4 weeks  Signed, Shirley Friar, PA-C

## 2022-07-28 ENCOUNTER — Ambulatory Visit
Admission: RE | Admit: 2022-07-28 | Discharge: 2022-07-28 | Disposition: A | Discharge status: Home / Self Care | Payer: Medicaid Other | Source: Ambulatory Visit | Attending: Adult Health | Admitting: Adult Health

## 2022-07-28 DIAGNOSIS — J449 Chronic obstructive pulmonary disease, unspecified: Secondary | ICD-10-CM

## 2022-07-30 NOTE — Progress Notes (Signed)
EPIC Encounter for ICM Monitoring  Patient Name: Tamara Silva is a 62 y.o. female Date: 07/30/2022 Primary Care Physican: Ladell Pier, MD Primary Cardiologist: Aundra Dubin Electrophysiologist: Curt Bears 10/22/2021 Weight:  328 lbs 12/18/2021 Weight: 319 lbs 03/29/2022 Office Weight: 324 lbs 05/28/2022 Office Weight: 313 lbs 07/30/2022 Office Weight: 318 lbs   Since 20-Jul-2022 Time in AF    0.0 hr/day (0.0%)                                                            Spoke with patient and heart failure questions reviewed.  Transmission results reviewed.  Pt asymptomatic for fluid accumulation.  Reports feeling well at this time and voices no complaints.      Optivol thoracic impedance suggesting fluid levels returned to normal after taking prescribed 80 mg Torsemide for 2 days.  Recent Afib episode will be discussed with Oda Kilts, PA at 3/4 appointment.          Prescribed:  Torsemide 100 mg take 1 tablet (100 mg total) by mouth every morning and 0.5 tablet (50 mg total) every evening.  Pt reports 12/12 that she takes AM Torsemide and does not take the evening tablet.   Spironolactone 25 mg take 1 tablet daily Jardiance 10 mg take 1 tablet daily before breakfast   Labs: 05/28/2022 Creatinine 2.03, BUN 33, Potassium 3.8, Sodium 141, GFR 27 03/29/2022 Creatinine 1.63, BUN 30, Potassium 3.7, Sodium 142, GFR 35 10/22/2021 Creatinine 1.83, BUN 17, Potassium 3.8, Sodium 137, GFR 31 10/17/2021 Creatinine 1.81, BUN 39, Potassium 4.7, Sodium 142, GFR 31  10/16/2021 Creatinine 2.59, BUN 62, Potassium 4.4, Sodium 137, GFR 20  10/15/2021 Creatinine 2.32, BUN 54, Potassium 4.2, Sodium 136, GFR 23  A complete set of results can be found in Results Review.   Recommendations:  Advised to discuss with Jonni Sanger on 3/4 that her current prescribed dosage of Torsemide causes severe leg cramping.     Follow-up plan: ICM clinic phone appointment on 08/23/2022.   91 day device clinic remote transmission  10/19/2022.     EP/Cardiology Office Visits: 08/02/2022 with Lytle Michaels, [place].    09/27/2022 with HF clinic.  Recall 03/24/2023 with Dr Curt Bears.   Copy of ICM check sent to Dr. Curt Bears.   3 month ICM trend: 07/26/2022.    12-14 Month ICM trend:     [name], RN 07/30/2022 12:30 PM

## 2022-08-02 ENCOUNTER — Encounter: Payer: Self-pay | Admitting: Student

## 2022-08-02 ENCOUNTER — Ambulatory Visit: Payer: Medicaid Other | Attending: Student | Admitting: Student

## 2022-08-02 VITALS — BP 118/58 | HR 75 | Ht 67.5 in | Wt 320.0 lb

## 2022-08-02 DIAGNOSIS — Z9861 Coronary angioplasty status: Secondary | ICD-10-CM

## 2022-08-02 DIAGNOSIS — I5042 Chronic combined systolic (congestive) and diastolic (congestive) heart failure: Secondary | ICD-10-CM

## 2022-08-02 DIAGNOSIS — I2510 Atherosclerotic heart disease of native coronary artery without angina pectoris: Secondary | ICD-10-CM | POA: Diagnosis not present

## 2022-08-02 DIAGNOSIS — N183 Chronic kidney disease, stage 3 unspecified: Secondary | ICD-10-CM | POA: Diagnosis not present

## 2022-08-02 DIAGNOSIS — Z95810 Presence of automatic (implantable) cardiac defibrillator: Secondary | ICD-10-CM | POA: Diagnosis not present

## 2022-08-02 LAB — CUP PACEART INCLINIC DEVICE CHECK
Battery Remaining Longevity: 113 mo
Battery Voltage: 3 V
Brady Statistic RV Percent Paced: 0.01 %
Date Time Interrogation Session: 20240304091838
HighPow Impedance: 61 Ohm
Implantable Lead Connection Status: 753985
Implantable Lead Implant Date: 20201104
Implantable Lead Location: 753860
Implantable Pulse Generator Implant Date: 20201104
Lead Channel Impedance Value: 304 Ohm
Lead Channel Impedance Value: 342 Ohm
Lead Channel Pacing Threshold Amplitude: 0.5 V
Lead Channel Pacing Threshold Pulse Width: 0.4 ms
Lead Channel Sensing Intrinsic Amplitude: 14.25 mV
Lead Channel Sensing Intrinsic Amplitude: 14.875 mV
Lead Channel Setting Pacing Amplitude: 2.5 V
Lead Channel Setting Pacing Pulse Width: 0.4 ms
Lead Channel Setting Sensing Sensitivity: 0.3 mV
Zone Setting Status: 755011
Zone Setting Status: 755011

## 2022-08-02 MED ORDER — APIXABAN 5 MG PO TABS
5.0000 mg | ORAL_TABLET | Freq: Two times a day (BID) | ORAL | 3 refills | Status: DC
  Filled 2022-10-11: qty 60, 30d supply, fill #0
  Filled 2022-11-08: qty 60, 30d supply, fill #1

## 2022-08-02 NOTE — Patient Instructions (Addendum)
Medication Instructions:  1.Start Eliquis 5 mg twice daily *If you need a refill on your cardiac medications before your next appointment, please call your pharmacy*   Lab Work: BMET and CBC today If you have labs (blood work) drawn today and your tests are completely normal, you will receive your results only by: [place] (if you have MyChart) OR A paper copy in the mail If you have any lab test that is abnormal or we need to change your treatment, we will call you to review the results.   Follow-Up: At [facility], you and your health needs are our priority.  As part of our continuing mission to provide you with exceptional heart care, we have created designated Provider Care Teams.  These Care Teams include your primary Cardiologist (physician) and Advanced Practice Providers (APPs -  Physician Assistants and Nurse Practitioners) who all work together to provide you with the care you need, when you need it.   Your next appointment:   4 week(s)  Provider:   Oda Kilts PA-C

## 2022-08-03 ENCOUNTER — Other Ambulatory Visit: Payer: Self-pay

## 2022-08-03 ENCOUNTER — Ambulatory Visit: Payer: Medicaid Other | Attending: Internal Medicine | Admitting: Internal Medicine

## 2022-08-03 VITALS — BP 122/74 | HR 78 | Temp 97.4°F | Ht 67.0 in | Wt 323.0 lb

## 2022-08-03 DIAGNOSIS — E1142 Type 2 diabetes mellitus with diabetic polyneuropathy: Secondary | ICD-10-CM

## 2022-08-03 DIAGNOSIS — E1159 Type 2 diabetes mellitus with other circulatory complications: Secondary | ICD-10-CM | POA: Diagnosis not present

## 2022-08-03 DIAGNOSIS — I2510 Atherosclerotic heart disease of native coronary artery without angina pectoris: Secondary | ICD-10-CM

## 2022-08-03 DIAGNOSIS — Z9981 Dependence on supplemental oxygen: Secondary | ICD-10-CM

## 2022-08-03 DIAGNOSIS — F319 Bipolar disorder, unspecified: Secondary | ICD-10-CM

## 2022-08-03 DIAGNOSIS — J449 Chronic obstructive pulmonary disease, unspecified: Secondary | ICD-10-CM

## 2022-08-03 DIAGNOSIS — I5022 Chronic systolic (congestive) heart failure: Secondary | ICD-10-CM | POA: Diagnosis not present

## 2022-08-03 DIAGNOSIS — Z2911 Encounter for prophylactic immunotherapy for respiratory syncytial virus (RSV): Secondary | ICD-10-CM

## 2022-08-03 DIAGNOSIS — J9611 Chronic respiratory failure with hypoxia: Secondary | ICD-10-CM

## 2022-08-03 DIAGNOSIS — Z1231 Encounter for screening mammogram for malignant neoplasm of breast: Secondary | ICD-10-CM

## 2022-08-03 DIAGNOSIS — E662 Morbid (severe) obesity with alveolar hypoventilation: Secondary | ICD-10-CM

## 2022-08-03 DIAGNOSIS — Z794 Long term (current) use of insulin: Secondary | ICD-10-CM | POA: Diagnosis not present

## 2022-08-03 DIAGNOSIS — Z6841 Body Mass Index (BMI) 40.0 and over, adult: Secondary | ICD-10-CM

## 2022-08-03 DIAGNOSIS — I152 Hypertension secondary to endocrine disorders: Secondary | ICD-10-CM

## 2022-08-03 DIAGNOSIS — F1411 Cocaine abuse, in remission: Secondary | ICD-10-CM

## 2022-08-03 LAB — BASIC METABOLIC PANEL
BUN/Creatinine Ratio: 19 (ref 12–28)
BUN: 30 mg/dL — ABNORMAL HIGH (ref 8–27)
CO2: 27 mmol/L (ref 20–29)
Calcium: 9.6 mg/dL (ref 8.7–10.3)
Chloride: 101 mmol/L (ref 96–106)
Creatinine, Ser: 1.58 mg/dL — ABNORMAL HIGH (ref 0.57–1.00)
Glucose: 78 mg/dL (ref 70–99)
Potassium: 4.3 mmol/L (ref 3.5–5.2)
Sodium: 144 mmol/L (ref 134–144)
eGFR: 37 mL/min/{1.73_m2} — ABNORMAL LOW (ref >59)

## 2022-08-03 LAB — GLUCOSE, POCT (MANUAL RESULT ENTRY): POC Glucose: 123 mg/dl — AB (ref 70–99)

## 2022-08-03 LAB — POCT GLYCOSYLATED HEMOGLOBIN (HGB A1C): HbA1c, POC (controlled diabetic range): 6.6 % (ref 0.0–7.0)

## 2022-08-03 LAB — CBC
Hematocrit: 38.8 % (ref 34.0–46.6)
Hemoglobin: 12 g/dL (ref 11.1–15.9)
MCH: 25.3 pg — ABNORMAL LOW (ref 26.6–33.0)
MCHC: 30.9 g/dL — ABNORMAL LOW (ref 31.5–35.7)
MCV: 82 fL (ref 79–97)
Platelets: 317 10*3/uL (ref 150–450)
RBC: 4.75 x10E6/uL (ref 3.77–5.28)
RDW: 16.5 % — ABNORMAL HIGH (ref 11.7–15.4)
WBC: 7.1 10*3/uL (ref 3.4–10.8)

## 2022-08-03 MED ORDER — RSVPREF3 VAC RECOMB ADJUVANTED 120 MCG/0.5ML IM SUSR
0.5000 mL | Freq: Once | INTRAMUSCULAR | 0 refills | Status: AC
  Filled 2022-08-03: qty 0.5, 1d supply, fill #0

## 2022-08-03 NOTE — Progress Notes (Unsigned)
Patient ID: Tamara Silva, female    DOB: 1960/09/19  MRN: [id number]  CC: Diabetes (DM f/u. Med refills.  Kinnie Feil [employer] paperwork to be completed due to heart monitor, ventilator & other equipment/)   Subjective: Tamara Silva is a 62 y.o. female who presents for chronic ds management.  Her boyfriend is with her. Her concerns today include:  Pt with hx of HTN, combined chronic CHF (EF30-35% 07/2021), cardiac arrest, ICD, CAD (followed by Dr. Marigene Ehlers), CVA, chronic PE, OHS ( onBiPAP), COPD, hypoxic respiratory failure on home O2 3 Lt continuous,  DM with neuropathy, obesity, CKD stage 3-4 (Dr. Harrie Jeans), former tob dep, IDA/ACD (history GI bleed with endoscopies 07/2018 - poor prep of colon on c-scope) Vit B12 def, polyclonal gammopathy (followed by Dr. Lorenso Courier).   DM: Results for orders placed or performed in visit on 08/03/22  POCT glucose (manual entry)  Result Value Ref Range   POC Glucose 123 (A) 70 - 99 mg/dl  POCT glycosylated hemoglobin (Hb A1C)  Result Value Ref Range   Hemoglobin A1C     HbA1c POC (<> result, manual entry)     HbA1c, POC (prediabetic range)     HbA1c, POC (controlled diabetic range) 6.6 0.0 - 7.0 %   *Note: Due to a large number of results and/or encounters for the requested time period, some results have not been displayed. A complete set of results can be found in Results Review.  Patient reports compliance with Tresiba 60-80 units daily, Jardiance 10 mg daily Ozempic 2 mg once a week. Referred to endocrinology a few times.  No showed appt with [facility].  Reports she did not remember the appt. [place] from [employer]; [facility] not taking new appt.   -Ozempic still decreases appetite.   Does some stretching exercises in bed.  She walks up and down steps at home. Uses manual WC when going to appts but walks in her house   Combined CHF/CAD/A. fib: saw cardiology PA yesterday.  Noted to have some runs of PAF on her device.   Started on Eliquis.  CHA2DS2-VASc score of 7. No CP.  SOB if she tries to walk too fast or if she gets agitiated.  No LE edema Reports compliance with medications including torsemide 100 mg in the morning and 50 mg in the evening, BiDil 20/37.51 tablet 3 times a day, Jardiance 10 mg daily, Entresto 49/51 mg 1 tablet twice a day, spironolactone 25 mg daily  CKD 3b and secondary hyperparathyroidism: saw Dr. Royce Macadamia last wk.  On Jardiance.  Most recent GFR 37 with creatinine 1.58  Chronic respiratory failure with hypoxia/hypercapnia/OHS/COPD: No recent flare and COPD.  She continues on Anoro inhaler.  Uses her oxygen 3 L continuous.  Reports compliance with using BiPAP Has a form from [employer] that she would like for me to complete to help prevent her electricity from being shot off due to medical equipment that she uses including BiPAP, her oxygen concentrator and nebulizer.  History of bipolar disorder.  She was seeing behavioral health several years ago and was on Invega injections.  She has not been plugged in with behavioral health in a while and does not feel that she needs to at this time.  Past history of substance abuse in particular cocaine.  Patient states she has been clean for many years.  Uses marijuana occasionally.  HM: Due for mammogram, Pap and shingles vaccine.  She would like prescription to get RSV vaccine.  Patient Active  Problem List   Diagnosis Date Noted   Sinus bradycardia 10/16/2021   Nausea & vomiting 10/16/2021   AKI (acute kidney injury) ([place]) 10/14/2021   Class 3 obesity ([place]) 10/14/2021   NPDR (nonproliferative diabetic retinopathy) ([place]) 08/06/2020   Anemia of chronic disease 01/25/2020   Coronary artery disease involving native coronary artery of native heart with angina pectoris ([place]) [id number]   Chronic systolic (congestive) heart failure ([place]) 04/04/2019   Diabetic peripheral neuropathy ([place]) 01/19/2019   Former smoker 01/19/2019   CKD (chronic kidney  disease) stage 3, GFR 30-59 ml/min ([place]) 01/19/2019   Cardiac arrest ([place]) 10/28/2018   Noncompliance with treatment plan 07/24/2018   General patient noncompliance 07/24/2018   Noncompliance with diet and medication regimen 07/24/2018   Dysarthria    BiPAP (biphasic positive airway pressure) dependence    Type II diabetes mellitus with renal manifestations ([place]) 07/08/2018   Hepatic steatosis 06/22/2018   Chronic respiratory failure with hypoxia and hypercapnia ([place])    On home O2    Slow transit constipation    Cocaine abuse ([place])    Diabetes mellitus type 2 in obese ([place])    Cerebral embolism with cerebral infarction 06/01/2018   Pressure injury of skin 05/30/2018   Iron deficiency anemia 01/27/2018   Tobacco abuse disorder 10/03/2017   Dyslipidemia 04/29/2016   Primary insomnia 04/29/2016   Obesity hypoventilation syndrome (HCC)    OSA (obstructive sleep apnea)    Dyslipidemia associated with type 2 diabetes mellitus ([place]) 05/21/2015   COPD GOLD III  06/10/2014   CAD S/P percutaneous coronary angioplasty - prior PCI to LAD; RCA PCI: new Xience Alpine DES 2.75 mm x 15 mm  10/07/2013   Morbid obesity ([place]) 07/30/2013   Chronic combined systolic and diastolic heart failure ([place]) 07/10/2013   Bipolar disorder ([place]) 02/08/2013   Essential hypertension 02/08/2013   COPD (chronic obstructive pulmonary disease) ([place]) 10/15/2012     Current Outpatient Medications on File Prior to Visit  Medication Sig Dispense Refill   ACCU-CHEK AVIVA PLUS test strip USE 1 STRIP TO CHECK BLOOD SUGAR 3 TIMES A DAY . 100 each 0   acetaminophen (TYLENOL) 500 MG tablet Take 1,000 mg by mouth every 6 (six) hours as needed for moderate pain or headache.     albuterol (PROVENTIL) (2.5 MG/3ML) 0.083% nebulizer solution USE ONE VIAL BY NEBULIZATION EVERY 6 (SIX) HOURS AS NEEDED FOR WHEEZING OR SHORTNESS OF BREATH. 90 mL 0   albuterol (PROVENTIL) (2.5 MG/3ML) 0.083% nebulizer solution Take 3 mLs (2.5 mg total)  by nebulization every 6 (six) hours as needed for wheezing or shortness of breath. 120 mL 0   apixaban (ELIQUIS) 5 MG TABS tablet Take 1 tablet (5 mg total) by mouth 2 (two) times daily. 60 tablet 3   ascorbic acid (VITAMIN C) 500 MG tablet TAKE ONE TABLET BY MOUTH ONCE DAILY 90 tablet 3   aspirin EC (ASPIRIN LOW DOSE) 81 MG tablet TAKE ONE TABLET BY MOUTH ONCE DAILY 90 tablet 0   BIDIL 20-37.5 MG tablet TAKE 1 TABLET BY MOUTH 3 (THREE) TIMES DAILY. 270 tablet 3   Blood Glucose Monitoring Suppl (ACCU-CHEK GUIDE) w/Device KIT Use accu chek guide to check blood sugar three times daily. 1 kit 0   Continuous Blood Gluc Sensor (FREESTYLE LIBRE 2 SENSOR) MISC USE ONE SENSOR EVERY 14 (FOURTEEN) DAYS. 2 each 0   D3-1000 25 MCG (1000 UT) tablet Take 1,000 Units by mouth 2 (two) times daily.     empagliflozin (JARDIANCE)  10 MG TABS tablet TAKE ONE TABLET BY MOUTH ONCE DAILY BEFORE BREAKFAST 90 tablet 3   FEROSUL 325 (65 Fe) MG tablet TAKE 1 TABLET (325 MG TOTAL) BY MOUTH DAILY AFTER SUPPER. PLEASE TAKE WITH A SOURCE OF VITAMIN C 90 tablet 3   fluticasone (FLONASE) 50 MCG/ACT nasal spray Place 1 spray into both nostrils daily. 16 g 6   hydrocerin (EUCERIN) CREA Apply 1 application topically 2 (two) times daily.  0   insulin degludec (TRESIBA FLEXTOUCH) 200 UNIT/ML FlexTouch Pen Inject 80 Units into the skin daily. 15 mL 3   Insulin Pen Needle (EASY COMFORT PEN NEEDLES) 31G X 5 MM MISC USE 3 TIMES A DAY FOR INSULIN ADMINISTRATION 100 each 12   ipratropium-albuterol (DUONEB) 0.5-2.5 (3) MG/3ML SOLN Take 3 mLs by nebulization every 6 (six) hours as needed. 360 mL 3   NEEDLE, DISP, 26 G 26G X 1/2" MISC 1 application by Does not apply route daily. 100 each 0   omeprazole (PRILOSEC) 40 MG capsule Take 1 capsule (40 mg total) by mouth daily. 30 capsule 0   polyethylene glycol (MIRALAX / GLYCOLAX) 17 g packet Take 17 g by mouth daily as needed. 14 each 6   RESTASIS 0.05 % ophthalmic emulsion Place 1 drop into both  eyes daily as needed (dry eyes).     sacubitril-valsartan (ENTRESTO) 49-51 MG Take 1 tablet by mouth 2 (two) times daily. 90 tablet 3   Semaglutide, 2 MG/DOSE, (OZEMPIC, 2 MG/DOSE,) 8 MG/3ML SOPN INJECT 2 MG INTO THE SKIN ONCE A WEEK. 3 mL 1   senna-docusate (SENOKOT-S) 8.6-50 MG tablet Take 1 tablet by mouth 2 (two) times daily. 30 tablet 0   torsemide (DEMADEX) 100 MG tablet Take 1 tablet (100 mg total) by mouth in the morning AND 0.5 tablets (50 mg total) every evening. (Patient taking differently: Take 1 tablet (100 mg total) by mouth in the morning AND 0.5 tablets (50 mg total) every evening.PRN ) 45 tablet 3   umeclidinium-vilanterol (ANORO ELLIPTA) 62.5-25 MCG/ACT AEPB Inhale 1 puff into the lungs daily. 60 each 11   vitamin B-12 (CYANOCOBALAMIN) 1000 MCG tablet Take 1,000 mcg by mouth daily.     spironolactone (ALDACTONE) 25 MG tablet Take 25 mg by mouth daily. (Patient not taking: Reported on 08/03/2022)     No current facility-administered medications on file prior to visit.    Allergies  Allergen Reactions   Metolazone Other (See Comments)    Dizziness and falling Per patient in 2021, states she does not think she fell because of this medication.   Ibuprofen Other (See Comments)    Pt states she is not supposed to take ibuprofen because of other meds she is taking   Nsaids Other (See Comments)    "I do not take NSAIDs d/t interfering with other meds"    Social History   Socioeconomic History   Marital status: Single    Spouse name: Not on file   Number of children: 1   Years of education: Not on file   Highest education level: Not on file  Occupational History   Not on file  Tobacco Use   Smoking status: Some Days    Packs/day: 0.50    Years: 20.00    Total pack years: 10.00    Types: Cigarettes    Last attempt to quit: 05/31/2018    Years since quitting: 4.1   Smokeless tobacco: Never   Tobacco comments:    smoked off and on x 20  years.  Still smokes some when  stressed.  No sure how often or how much.  07/22/2022  hfb  Vaping Use   Vaping Use: Never used  Substance and Sexual Activity   Alcohol use: No    Alcohol/week: 0.0 standard drinks of alcohol   Drug use: Not Currently    Types: Cocaine   Sexual activity: Yes  Other Topics Concern   Not on file  Social History Narrative   Origibnally from [place]   Most recently from [place]   Daughter lives in town      On Social security to CHF, COPD, CAD   Social Determinants of Health   Financial Resource Strain: Medium Risk (01/13/2018)   Overall Financial Resource Strain (CARDIA)    Difficulty of Paying Living Expenses: Somewhat hard  Food Insecurity: Food Insecurity Present (01/13/2018)   Hunger Vital Sign    Worried About Running Out of Food in the Last Year: Sometimes true    Ran Out of Food in the Last Year: Sometimes true  Transportation Needs: Unmet Transportation Needs (01/13/2018)   PRAPARE - Hydrologist (Medical): Yes    Lack of Transportation (Non-Medical): Yes  Physical Activity: Inactive (01/13/2018)   Exercise Vital Sign    Days of Exercise per Week: 0 days    Minutes of Exercise per Session: 0 min  Stress: Stress Concern Present (01/13/2018)   [place]    Feeling of Stress : Rather much  Social Connections: Moderately Integrated (01/13/2018)   Social Connection and Isolation Panel [NHANES]    Frequency of Communication with Friends and Family: Twice a week    Frequency of Social Gatherings with Friends and Family: Once a week    Attends Religious Services: 1 to 4 times per year    Active Member of [employer] or Organizations: No    Attends Archivist Meetings: Never    Marital Status: Living with partner  Intimate Partner Violence: Unknown (06/25/2018)   Humiliation, Afraid, Rape, and Kick questionnaire    Fear of Current or Ex-Partner: Patient refused    Emotionally  Abused: Patient refused    Physically Abused: Patient refused    Sexually Abused: Patient refused    Family History  Problem Relation Age of Onset   Stroke Mother    Hypertension Mother    Colon cancer Father    Diabetes Father    Heart attack Father    Sarcoidosis Other    Breast cancer Paternal Aunt    Emphysema Brother    Lung disease Brother        Unknown type, 3 brothers, one with liver and lung disease   Diabetes Brother    Asthma Paternal Aunt    Diabetes Sister     Past Surgical History:  Procedure Laterality Date   BIOPSY  07/10/2018   Procedure: BIOPSY;  Surgeon: [name], MD;  Location: [facility];  Service: Gastroenterology;;   COLONOSCOPY WITH PROPOFOL N/A 08/03/2013   Procedure: COLONOSCOPY WITH PROPOFOL;  Surgeon: Jeryl Columbia, MD;  Location: [facility];  Service: Endoscopy;  Laterality: N/A;   COLONOSCOPY WITH PROPOFOL N/A 07/10/2018   Procedure: COLONOSCOPY WITH PROPOFOL;  Surgeon: [name], MD;  Location: [facility];  Service: Gastroenterology;  Laterality: N/A;   CORONARY ANGIOPLASTY WITH STENT PLACEMENT     CAD in 2006 x 2 and 2009 2 more- place din [facility]Ex in [place] and [place] [facility]   CORONARY  ANGIOPLASTY WITH STENT PLACEMENT  10/07/2013   Xience Alpine DES 2.75  mm x 15  mm   ESOPHAGOGASTRODUODENOSCOPY (EGD) WITH PROPOFOL N/A 08/03/2013   Procedure: ESOPHAGOGASTRODUODENOSCOPY (EGD) WITH PROPOFOL;  Surgeon: Jeryl Columbia, MD;  [facility];  Service: Endoscopy;  Laterality: N/A;   ESOPHAGOGASTRODUODENOSCOPY (EGD) WITH PROPOFOL N/A 07/10/2018   Procedure: ESOPHAGOGASTRODUODENOSCOPY (EGD) WITH PROPOFOL;  Surgeon: [name], MD;  Location: [facility];  Service: Gastroenterology;  Laterality: N/A;   ICD IMPLANT  04/04/2019   ICD IMPLANT N/A 04/04/2019   Procedure: ICD IMPLANT;  Surgeon: Constance Haw, MD;  Location: [facility];  Service: Cardiovascular;  Laterality: N/A;   LEFT HEART CATHETERIZATION WITH CORONARY ANGIOGRAM N/A  10/07/2013   Procedure: LEFT HEART CATHETERIZATION WITH CORONARY ANGIOGRAM;  Surgeon: Leonie Man, MD;  Location: [facility];  Service: Cardiovascular;  Laterality: N/A;   RIGHT HEART CATH N/A 11/06/2018   Procedure: RIGHT HEART CATH;  Surgeon: Larey Dresser, MD;  Location: [facility];  Service: Cardiovascular;  Laterality: N/A;   RIGHT/LEFT HEART CATH AND CORONARY ANGIOGRAPHY N/A 02/12/2019   Procedure: RIGHT/LEFT HEART CATH AND CORONARY ANGIOGRAPHY;  Surgeon: Larey Dresser, MD;  Location: [facility];  Service: Cardiovascular;  Laterality: N/A;    ROS: Review of Systems Negative except as stated above  PHYSICAL EXAM: BP 122/74 (BP Location: Left Arm, Patient Position: Sitting, Cuff Size: Large)   Pulse 78   Temp (!) 97.4 F (36.3 C) (Oral)   Ht '5\' 7"'$  (1.702 m)   Wt (!) 323 lb (146.5 kg)   LMP 05/30/2013   SpO2 94%   BMI 50.59 kg/m   Wt Readings from Last 3 Encounters:  08/03/22 (!) 323 lb (146.5 kg)  08/02/22 (!) 320 lb (145.2 kg)  07/22/22 (!) 318 lb 12.8 oz (144.6 kg)    Physical Exam  General appearance - alert, well appearing, morbidly obese middle-age African-American female and in no distress Mental status - normal mood, behavior, speech, dress, motor activity, and thought processes Neck - supple, no significant adenopathy Chest - clear to auscultation, no wheezes, rales or rhonchi, symmetric air entry Heart - normal rate, regular rhythm, normal S1, S2, no murmurs, rubs, clicks or gallops Extremities - peripheral pulses normal, no pedal edema, no clubbing or cyanosis     08/03/2022    3:14 PM 08/03/2022    2:55 PM 12/14/2021   11:18 AM  Depression screen PHQ 2/9  Decreased Interest 0 0 0  Down, Depressed, Hopeless 0 0 0  PHQ - 2 Score 0 0 0  Altered sleeping 2 2   Tired, decreased energy 1 1   Change in appetite 0 0   Feeling bad or failure about yourself  1 1   Trouble concentrating 1 1   Moving slowly or fidgety/restless 0 0   Suicidal  thoughts 0 0   PHQ-9 Score 5 5        Latest Ref Rng & Units 08/02/2022    9:38 AM 05/28/2022   10:28 AM 03/29/2022   10:26 AM  CMP  Glucose 70 - 99 mg/dL 78  104  113   BUN 8 - 27 mg/dL 30  33  30   Creatinine 0.57 - 1.00 mg/dL 1.58  2.03  1.63   Sodium 134 - 144 mmol/L 144  141  142   Potassium 3.5 - 5.2 mmol/L 4.3  3.8  3.7   Chloride 96 - 106 mmol/L 101  102  101   CO2 20 - 29 mmol/L 27  28  32   Calcium 8.7 - 10.3 mg/dL 9.6  9.2  9.3    Lipid Panel     Component Value Date/Time   CHOL 128 07/28/2021 1526   TRIG 74 07/28/2021 1526   HDL 58 07/28/2021 1526   CHOLHDL 2.2 07/28/2021 1526   VLDL 15 07/28/2021 1526   LDLCALC 55 07/28/2021 1526    CBC    Component Value Date/Time   WBC 7.1 08/02/2022 0938   WBC 4.4 10/15/2021 0647   RBC 4.75 08/02/2022 0938   RBC 4.14 10/15/2021 0647   HGB 12.0 08/02/2022 0938   HCT 38.8 08/02/2022 0938   PLT 317 08/02/2022 0938   MCV 82 08/02/2022 0938   MCH 25.3 (L) 08/02/2022 0938   MCH 25.1 (L) 10/15/2021 0647   MCHC 30.9 (L) 08/02/2022 0938   MCHC 29.9 (L) 10/15/2021 0647   RDW 16.5 (H) 08/02/2022 0938   LYMPHSABS 0.7 10/14/2021 0901   LYMPHSABS 3.4 (H) 11/30/2018 1346   MONOABS 0.3 10/14/2021 0901   EOSABS 0.0 10/14/2021 0901   EOSABS 0.2 11/30/2018 1346   BASOSABS 0.0 10/14/2021 0901   BASOSABS 0.1 11/30/2018 1346    ASSESSMENT AND PLAN: 1. Type 2 diabetes mellitus with diabetic polyneuropathy, with long-term current use of insulin (HCC) At goal.  Patient will continue Tresiba, Jardiance and Ozempic  Encouraged healthy eating habits - POCT glucose (manual entry) - POCT glycosylated hemoglobin (Hb A1C) - Microalbumin / creatinine urine ratio  2. Hypertension associated with diabetes ([place]) At goal.  Continue BiDil, Entresto and Spironolactone  3. Morbid obesity ([place]) See #1 above  4. Chronic systolic (congestive) heart failure (HCC) Compensated Continue Entresto, Spironolactone and Torsemide  5. Chronic  respiratory failure with hypoxia, on home oxygen therapy (HCC) Stable  6. Obesity hypoventilation syndrome (HCC) Continue use of BiPAP and O2  7. Chronic obstructive pulmonary disease, unspecified COPD type ([place]) Continue Anuro  8. Bipolar affective disorder, remission status unspecified ([place]) Declines referral to reest with BH at this time  9. Coronary artery disease involving native coronary artery of native heart without angina pectoris Stable  10. Cocaine abuse in remission [facility]) Commended her on this and encouraged to remain drug free  11. Need for RSV immunization - RSV vaccine recomb adjuvanted (AREXVY) 120 MCG/0.5ML injection; Inject 0.5 mLs into the muscle once for 1 dose.  Dispense: 0.5 mL; Refill: 0  12. Encounter for screening mammogram for malignant neoplasm of breast - MM Digital Screening; Future     Patient was given the opportunity to ask questions.  Patient verbalized understanding of the plan and was able to repeat key elements of the plan.   This documentation was completed using Radio producer.  Any transcriptional errors are unintentional.  Orders Placed This Encounter  Procedures   POCT glucose (manual entry)   POCT glycosylated hemoglobin (Hb A1C)     Requested Prescriptions    No prescriptions requested or ordered in this encounter    No follow-ups on file.  Karle Plumber, MD, FACP

## 2022-08-04 ENCOUNTER — Other Ambulatory Visit: Payer: Self-pay

## 2022-08-04 ENCOUNTER — Encounter: Payer: Self-pay | Admitting: Internal Medicine

## 2022-08-04 DIAGNOSIS — J302 Other seasonal allergic rhinitis: Secondary | ICD-10-CM

## 2022-08-04 MED ORDER — FLUTICASONE PROPIONATE 50 MCG/ACT NA SUSP
1.0000 | Freq: Every day | NASAL | 6 refills | Status: DC
  Filled 2022-10-11: qty 16, 34d supply, fill #0
  Filled 2022-11-08: qty 16, 34d supply, fill #1
  Filled 2022-12-24: qty 16, 34d supply, fill #2
  Filled 2023-01-17 – 2023-01-19 (×3): qty 16, 34d supply, fill #3
  Filled 2023-02-21: qty 16, 34d supply, fill #4

## 2022-08-05 NOTE — Progress Notes (Signed)
Called and spoke with patient, provided results/recommendations per [name] NP.  She verbalized understanding.  Advised I would send a message to her PCP regarding need for f/u for atherosclerosis.

## 2022-08-08 ENCOUNTER — Other Ambulatory Visit: Payer: Self-pay | Admitting: Internal Medicine

## 2022-08-08 MED ORDER — HYDROCERIN EX CREA: 1.0000 | TOPICAL_CREAM | Freq: Two times a day (BID) | CUTANEOUS | 0 refills | Status: DC

## 2022-08-10 ENCOUNTER — Other Ambulatory Visit: Payer: Self-pay | Admitting: Internal Medicine

## 2022-08-10 DIAGNOSIS — K219 Gastro-esophageal reflux disease without esophagitis: Secondary | ICD-10-CM

## 2022-08-10 NOTE — Telephone Encounter (Signed)
Requested Prescriptions  Pending Prescriptions Disp Refills   omeprazole (PRILOSEC) 40 MG capsule [Pharmacy Med Name: OMEPRAZOLE 40 MG ORAL CAPSULE DELAYED RELEASE] 90 capsule 0    Sig: TAKE 1 CAPSULE (40 MG TOTAL) BY MOUTH DAILY.     Gastroenterology: Proton Pump Inhibitors Passed - 08/10/2022 10:44 AM      Passed - Valid encounter within last 12 months    Recent Outpatient Visits           1 week ago Type 2 diabetes mellitus with diabetic polyneuropathy, with long-term current use of insulin ([place])   [place] Ladell Pier, MD   7 months ago Establishing care with new doctor, encounter for   [place], MD   2 years ago Type 2 diabetes mellitus with diabetic polyneuropathy, with long-term current use of insulin [facility])   [place] Karle Plumber B, MD   2 years ago Chronic systolic CHF (congestive heart failure) [facility])   [place] Karle Plumber B, MD   2 years ago Chronic systolic CHF (congestive heart failure) [facility])   [place], MD       Future Appointments             In 3 weeks Tillery, Satira Mccallum, PA-C [place] at [facility] [place]   In 1 month Wynetta Emery, Dalbert Batman, MD [place]

## 2022-08-13 ENCOUNTER — Telehealth: Payer: Self-pay | Admitting: Emergency Medicine

## 2022-08-13 DIAGNOSIS — I700 Atherosclerosis of aorta: Secondary | ICD-10-CM

## 2022-08-13 NOTE — Telephone Encounter (Signed)
Copied from [place] [id number]. Topic: General - Inquiry >> Aug 13, 2022  8:45 AM Penni Bombard wrote: Reason for CRM: pt called saying she had a chest xray and they told her she needed to be on cholesterol medication.  Per Pulmobology  CB#  [phone]  Pharmacy is  [facility]

## 2022-08-16 NOTE — Addendum Note (Signed)
Addended by: Karle Plumber B on: 08/16/2022 08:58 PM   Modules accepted: Orders

## 2022-08-16 NOTE — Telephone Encounter (Signed)
PC placed to pt this evening in response to her message. Pt had CT scan of chest last mth that showed incidental finding of aortic atherosclerosis.  I informed patient of what aortic atherosclerosis is.  She also has diabetes.  Based on these 2 factors, she should be on cholesterol-lowering medication.  Patient states she was on cholesterol medicine in the past but they had taken her off because her cholesterol is good.  Advised that being on cholesterol-lowering medication will help prevent risks of heart attack and strokes.  She does not have an up-to-date liver function test in the chart.  Advised that she comes to the lab sometime this week to have LFTs done for baseline.  Once I get the results of that, if it is normal, I will send prescription to the pharmacy for atorvastatin.  Advised that cholesterol medications can sometimes affect the liver which is why we check a baseline level first and then several weeks after being on the medicine.  Advised that it can also cause muscle aches and cramps.  If this occurs she should let me know.  Patient expressed understanding.  All questions were answered.

## 2022-08-20 ENCOUNTER — Other Ambulatory Visit: Payer: Self-pay | Admitting: Internal Medicine

## 2022-08-20 ENCOUNTER — Ambulatory Visit: Payer: Medicaid Other | Attending: Internal Medicine

## 2022-08-20 DIAGNOSIS — I700 Atherosclerosis of aorta: Secondary | ICD-10-CM

## 2022-08-20 DIAGNOSIS — E1165 Type 2 diabetes mellitus with hyperglycemia: Secondary | ICD-10-CM

## 2022-08-20 NOTE — Progress Notes (Signed)
Remote ICD transmission.   

## 2022-08-21 ENCOUNTER — Other Ambulatory Visit: Payer: Self-pay | Admitting: Internal Medicine

## 2022-08-21 LAB — HEPATIC FUNCTION PANEL
ALT: 13 IU/L (ref 0–32)
AST: 14 IU/L (ref 0–40)
Albumin: 4.1 g/dL (ref 3.9–4.9)
Alkaline Phosphatase: 85 IU/L (ref 44–121)
Bilirubin Total: <0.2 mg/dL (ref 0.0–1.2)
Bilirubin, Direct: <0.1 mg/dL (ref 0.00–0.40)
Total Protein: 8 g/dL (ref 6.0–8.5)

## 2022-08-21 MED ORDER — ATORVASTATIN CALCIUM 10 MG PO TABS: 10.0000 mg | ORAL_TABLET | Freq: Every day | ORAL | 3 refills | Status: DC

## 2022-08-23 ENCOUNTER — Ambulatory Visit: Payer: Medicaid Other | Attending: Cardiology

## 2022-08-23 ENCOUNTER — Telehealth (HOSPITAL_COMMUNITY): Payer: Self-pay

## 2022-08-23 ENCOUNTER — Other Ambulatory Visit (HOSPITAL_COMMUNITY): Payer: Self-pay

## 2022-08-23 DIAGNOSIS — I5042 Chronic combined systolic (congestive) and diastolic (congestive) heart failure: Secondary | ICD-10-CM | POA: Diagnosis not present

## 2022-08-23 DIAGNOSIS — Z95810 Presence of automatic (implantable) cardiac defibrillator: Secondary | ICD-10-CM

## 2022-08-23 NOTE — Telephone Encounter (Signed)
Patient Advocate Encounter  Prior authorization is required for Jardiance. PA submitted and APPROVED on 08/23/22. Test claim returns $4 copay for 90 day supply.  Key [id number] Effective: 08/23/22 - 08/23/23  Clista Bernhardt, CPhT Rx Patient Advocate Phone: [phone]

## 2022-08-27 NOTE — Progress Notes (Signed)
EPIC Encounter for ICM Monitoring  Patient Name: Tamara Silva is a 62 y.o. female Date: 08/27/2022 Primary Care Physican: Ladell Pier, MD Primary Cardiologist: Aundra Dubin Electrophysiologist: Curt Bears 10/22/2021 Weight:  328 lbs 12/18/2021 Weight: 319 lbs 03/29/2022 Office Weight: 324 lbs 05/28/2022 Office Weight: 313 lbs 07/30/2022 Office Weight: 318 lbs   Since 02-Aug-2022 Time in AF    0.0 hr/day (0.0%)                                                            Spoke with patient and heart failure questions reviewed.  Transmission results reviewed.  Pt asymptomatic for fluid accumulation.  Reports feeling well at this time and voices no complaints.      Optivol thoracic impedance suggesting normal fluid levels but was suggesting possible fluid accumulation from 3/9-3/18.         Prescribed:  Torsemide 100 mg take 1 tablet (100 mg total) by mouth every morning and 0.5 tablet (50 mg total) every evening.  Pt reports 12/12 that she takes AM Torsemide and does not take the evening tablet.   Spironolactone 25 mg take 1 tablet daily Jardiance 10 mg take 1 tablet daily before breakfast   Labs: 08/02/2022 Creatinine 1.58, BUN 30, Potassium 4.3, Sodium 144, GFR 37 05/28/2022 Creatinine 2.03, BUN 33, Potassium 3.8, Sodium 141, GFR 27 A complete set of results can be found in Results Review.   Recommendations: No changes and encouraged to call if experiencing any fluid symptoms.   Follow-up plan: ICM clinic phone appointment on 09/27/2022.   91 day device clinic remote transmission 10/19/2022.     EP/Cardiology Office Visits: 09/03/2022 with Lytle Michaels, [place].    09/27/2022 with HF clinic.  Recall 03/24/2023 with Dr Curt Bears.   Copy of ICM check sent to Dr. Curt Bears.   3 month ICM trend: 08/23/2022.    12-14 Month ICM trend:     [name], RN 08/27/2022 11:51 AM

## 2022-08-31 ENCOUNTER — Ambulatory Visit: Payer: Medicaid Other | Admitting: Internal Medicine

## 2022-09-01 ENCOUNTER — Telehealth: Payer: Self-pay | Admitting: Internal Medicine

## 2022-09-01 DIAGNOSIS — R21 Rash and other nonspecific skin eruption: Secondary | ICD-10-CM

## 2022-09-01 NOTE — Telephone Encounter (Signed)
-----   Message from Milly Jakob, [place] sent at 08/26/2022  4:50 PM EDT ----- Regarding: Dermatology referral request. Patient is requesting a referral to dermatology due to a skin break out on the face for the past 2 mo. Specifically on both cheeks and nasal area. Patient suspects that it may possibly due to psoriasis.

## 2022-09-02 NOTE — Progress Notes (Signed)
  Electrophysiology Office Note:   Date:  09/03/2022  ID:  [name], DOB 1961/03/21, MRN [id number]  Primary Cardiologist: Armanda Magic, MD Electrophysiologist: Regan Lemming, MD   History of Present Illness:   Tamara Silva is a 62 y.o. female with h/o CAD, CHF, CVA, Medtronic Single Chamber ICD, PE, DM2, and seizures  seen today for routine electrophysiology followup.   Seen 4 weeks due to new atrial fibrillation on her device. Started on anti-coagulation.   Since last being seen in our clinic the patient reports doing well overall. No new symptoms. No bleeding on eliquis.  Took ~80 mg of torsemide yesterday by chipping off a piece of her 100 mg tablets. Still complains of significant cramping with full doses. No worsening SOB. No chest pain or syncope.   Review of systems complete and found to be negative unless listed in HPI.   Device History: Medtronic Single Chamber ICD implanted 2020 for CHF   Studies Reviewed:    ICD Interrogation-  No new AF by brief check.   EKG is not ordered today. No new EF by brief check.   Risk Assessment/Calculations:     Physical Exam:   VS:  BP 124/72   Pulse 72   Ht 5\' 7"  (1.702 m)   Wt (!) 325 lb 3.2 oz (147.5 kg)   LMP 05/30/2013   SpO2 91%   BMI 50.93 kg/m    Wt Readings from Last 3 Encounters:  09/03/22 (!) 325 lb 3.2 oz (147.5 kg)  08/03/22 (!) 323 lb (146.5 kg)  08/02/22 (!) 320 lb (145.2 kg)     GEN: Well nourished, well developed in no acute distress NECK: No JVD; No carotid bruits CARDIAC: Regular rate and rhythm, no murmurs, rubs, gallops RESPIRATORY:  Clear to auscultation without rales, wheezing or rhonchi  ABDOMEN: Soft, non-tender, non-distended EXTREMITIES:  No edema; No deformity   ASSESSMENT AND PLAN:    Chronic systolic dysfunction s/p Medtronic single chamber ICD  euvolemic today Stable on an appropriate medical regimen Normal ICD function Brief check today for AF burden, No charge or testing.    PAF Burden <1% by device Continue eliquis 5 mg BID for CHA2DS2VASc  of at least 7.    CKD III-IV Baseline ~1.8-2.0  CAD Denies s/s ischemia   Disposition:   Follow up with Dr. Elberta Fortis in 6 months   Signed, Graciella Freer, PA-C

## 2022-09-03 ENCOUNTER — Encounter: Payer: Self-pay | Admitting: Student

## 2022-09-03 ENCOUNTER — Ambulatory Visit: Payer: Medicaid Other | Attending: Student | Admitting: Student

## 2022-09-03 VITALS — BP 124/72 | HR 72 | Ht 67.0 in | Wt 325.2 lb

## 2022-09-03 DIAGNOSIS — I2510 Atherosclerotic heart disease of native coronary artery without angina pectoris: Secondary | ICD-10-CM

## 2022-09-03 DIAGNOSIS — I5042 Chronic combined systolic (congestive) and diastolic (congestive) heart failure: Secondary | ICD-10-CM

## 2022-09-03 NOTE — Patient Instructions (Signed)
Medication Instructions:  Your physician recommends that you continue on your current medications as directed. Please refer to the Current Medication list given to you today.  *If you need a refill on your cardiac medications before your next appointment, please call your pharmacy*  Lab Work: BMET, CBC--TODAY If you have labs (blood work) drawn today and your tests are completely normal, you will receive your results only by: MyChart Message (if you have MyChart) OR A paper copy in the mail If you have any lab test that is abnormal or we need to change your treatment, we will call you to review the results.  Follow-Up: At [facility], you and your health needs are our priority.  As part of our continuing mission to provide you with exceptional heart care, we have created designated Provider Care Teams.  These Care Teams include your primary Cardiologist (physician) and Advanced Practice Providers (APPs -  Physician Assistants and Nurse Practitioners) who all work together to provide you with the care you need, when you need it.  Your next appointment:   6 month(s)  Provider:   Will Camnitz, MD  

## 2022-09-04 LAB — BASIC METABOLIC PANEL
BUN/Creatinine Ratio: 22 (ref 12–28)
BUN: 33 mg/dL — ABNORMAL HIGH (ref 8–27)
CO2: 24 mmol/L (ref 20–29)
Calcium: 9.2 mg/dL (ref 8.7–10.3)
Chloride: 102 mmol/L (ref 96–106)
Creatinine, Ser: 1.49 mg/dL — ABNORMAL HIGH (ref 0.57–1.00)
Glucose: 93 mg/dL (ref 70–99)
Potassium: 4.2 mmol/L (ref 3.5–5.2)
Sodium: 143 mmol/L (ref 134–144)
eGFR: 39 mL/min/{1.73_m2} — ABNORMAL LOW (ref >59)

## 2022-09-04 LAB — CBC
Hematocrit: 34.4 % (ref 34.0–46.6)
Hemoglobin: 10.6 g/dL — ABNORMAL LOW (ref 11.1–15.9)
MCH: 25 pg — ABNORMAL LOW (ref 26.6–33.0)
MCHC: 30.8 g/dL — ABNORMAL LOW (ref 31.5–35.7)
MCV: 81 fL (ref 79–97)
Platelets: 319 10*3/uL (ref 150–450)
RBC: 4.24 x10E6/uL (ref 3.77–5.28)
RDW: 15.9 % — ABNORMAL HIGH (ref 11.7–15.4)
WBC: 9.1 10*3/uL (ref 3.4–10.8)

## 2022-09-08 ENCOUNTER — Other Ambulatory Visit (HOSPITAL_COMMUNITY): Payer: Self-pay | Admitting: Cardiology

## 2022-09-13 ENCOUNTER — Other Ambulatory Visit: Payer: Self-pay | Admitting: Internal Medicine

## 2022-09-22 ENCOUNTER — Ambulatory Visit
Admission: RE | Admit: 2022-09-22 | Discharge: 2022-09-22 | Disposition: A | Discharge status: Home / Self Care | Payer: Medicaid Other | Source: Ambulatory Visit | Attending: Internal Medicine | Admitting: Internal Medicine

## 2022-09-22 DIAGNOSIS — Z1231 Encounter for screening mammogram for malignant neoplasm of breast: Secondary | ICD-10-CM

## 2022-09-24 ENCOUNTER — Telehealth (HOSPITAL_COMMUNITY): Payer: Self-pay

## 2022-09-24 NOTE — Progress Notes (Signed)
Tamara Silva, DOB 24-Mar-1961, MRN [id number]   Provider location: [facility] Type of Visit: Established patient   PCP:  Marcine Matar, MD Endocrinology: Dr. Lucianne Muss  Nephrology: Dr. Malen Gauze HF Cardiology: Dr Shirlee Latch   History of Present Illness: Tamara Silva is a 62 y.o. 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  She has had frequent admits for anemia and CHF with transfusions and diuresis. Prasugrel was stopped. No source of bleeding found on EGD and colonoscopy.   She is on home 02 and nocturnal BiPAP for OHS/OSA and COPD.   She no longer smokes.  Hx of PE in 2014.    Hx of bradycardia with Coreg at higher dose.  Echo 05/31/18 with EF 40-45%, PA peak pressure 71 mmHg.    Admitted 10/25/18 with SOB and edema, + productive cough.  This started over a few weeks prior to admission. Hospital course complicated by PEA arrest on 10/28/18. CPR for 25 minutes. Extubated 10/30/18.  Developed AKI.  HF team followed closely to optimize HF.  She was massively volume overloaded and extensively diuresed, weight down 65 lbs total.  Echo showed EF down to 30-35%.  No coronary angiography due to AKI.   Readmitted  11/17/18 with lower extremity edema and volume overload. Diuresed with IV lasix and transitioned to torsemide 40 mg twice daily. Discharged on 11/21/18 to home on oxygen.   She had RHC/LHC in 9/20, most significant stenosis was 75% ostial D1.  There was mild to moderate nonobstructive disease in other vessels. There was not an interventional target.  Filling pressures were mildly elevated with preserved cardiac output.  Torsemide was increased.   Echo in 10/20 showed that EF remained 30-35% with wall motion abnormalities, RV appeared normal.   Medtronic ICD placed in 11/20.   She was lost to follow up since 7/21. Returned 1/23 and had been off  metolazone for several months. She was mildly SOB walking around her house. Repeat echo and paramedicine arranged.  Echo in 2/23 showed EF 30-35%, dyskinetic apex, severe LV dilation, normal RV size and systolic function.  Admitted 5/23 with AKI, given IVF fluids. GDMT held. SCr improved and Bidil, torsemide and spiro restarted. Remained off Entresto. Bisoprolol stopped due to bradycardia and junctional rhythm. Discharged home, weight 332 lbs.   Today she returns for HF follow up. Overall feeling fine. She has SOB with ADLs, does OK if she takes her time. She uses her wheelchair in her home, rolls from room to room with her legs. She wears 3L oxygen continuously. Denies palpitations, abnormal bleeding, CP, dizziness, edema, or PND/Orthopnea. Appetite ok. No fever or chills. Weight at home 325 pounds. Taking all medications, now off spiro per nephrology. Wears BiPAP nightly. Smokes a few cigarettes/week. Has leg cramps after taking torsemide.  ECG (personally reviewed): NSR 68 bpm  MDT device interrogation (device interrogation): OptiVol stable but thoracic impedence below threshold, 0.9 hr/day activity, no AF or AT  Labs (7/20): K 3.7, creatinine 1.56 Labs (9/20): K 3.7, creatinine 1.46, LDL 52, HDL 56  Labs (12/20): K 3.8, creatinine 1.51 Labs (1/21): hgb 10.6, K 4.4, creatinine 1.91 Labs (5/21): LDL 68, HDL 56 Labs (7/21): K 4.4, creatinine 2.34, hgb 9.7 Labs (11/22): K 4.4., creatinine 2.30, hgb 9.3 Labs (1/23): K 4.0, creatinine 2.32 Labs (2/23):  LDL 55, TGs 74 Labs (3/23): K 4.4, creatinine 2.33 Labs (4/23): Creatinine 1.88 Labs (5/23): K 4.7, creatinine 1.81, BNP 78.5 Labs (10/23): K 3.7, creatinine 1.6  Labs (4/24): K 4.2, creatinine 1.49  PMH: 1. CVA (1/20). 2. Type 2 diabetes 3. HTN 4. H/o seizures 5. PEA arrest (5/20) with CPR.  6. COPD: Home oxygen.  Stopped smoking in 1/15.  7. CAD: PCI [facility] 2006, [facility] 2009.  - Inferior STEMI 5/15, DES to RCA.  - Poor  responder to Plavix.  - LHC (9/20): 75% ostial D1, 40-50% mLCx, 40% proximal PLV.  8. Hyperlipidemia 9. PE: 2014, [facility].  10. OHS/OSA: Home oxygen and Bipap used.  11. Chronic systolic CHF: Suspect primarily ischemic cardiomyopathy. Medtronic ICD.  - Echo (5/15): EF 35-40% - Echo (2/16): EF 40-45% - Echo (1/20): EF 40-45% - Echo (5/20): EF 30-35%, mild RV dilation.  - RHC (5/20): mean RA 9, PA 62/22 mean 37, mean PCWP 15, CI 4.29, PVR 2.1 WU.  - RHC (9/20): mean RA 11, PA 66/18 mean 37, mean PCWP 19, CI 3.12, PVR 2.4 WU - Echo (10/20): EF 30-35% with wall motion abnormalities, normal RV.  - Echo (2/23): EF 30-35%, dyskinetic apex, severe LV dilation, normal RV size and systolic function. 12. CKD: Stage 3.  13. Fe deficiency anemia: EGD and colonoscopy without definite source of bleeding.   ROS: All systems negative except as listed in HPI, PMH and Problem List.  SH:  Social History   Socioeconomic History   Marital status: Single    Spouse name: Not on file   Number of children: 1   Years of education: Not on file   Highest education level: Not on file  Occupational History   Not on file  Tobacco Use   Smoking status: Some Days    Packs/day: 0.50    Years: 20.00    Additional pack years: 0.00    Total pack years: 10.00    Types: Cigarettes    Last attempt to quit: 05/31/2018    Years since quitting: 4.3   Smokeless tobacco: Never   Tobacco comments:    smoked off and on x 20 years.  Still smokes some when stressed.  No sure how often or how much.  07/22/2022  hfb  Vaping Use   Vaping Use: Never used  Substance and Sexual Activity   Alcohol use: No    Alcohol/week: 0.0 standard drinks of alcohol   Drug use: Not Currently    Types: Cocaine   Sexual activity: Yes  Other Topics Concern   Not on file  Social History Narrative   Origibnally from [place]   Most recently from [place]   Daughter lives in town      On Social security to CHF, COPD, CAD   Social  Determinants of Health   Financial Resource Strain: Medium Risk (01/13/2018)   Overall Financial Resource Strain (CARDIA)    Difficulty of Paying Living Expenses: Somewhat hard  Food Insecurity: Food Insecurity Present (01/13/2018)   Hunger Vital Sign    Worried About Running Out of Food in the Last Year: Sometimes true    Ran Out of Food in the Last Year: Sometimes true  Transportation Needs: Unmet Transportation Needs (01/13/2018)   PRAPARE - Transportation    Lack of Transportation (Medical): Yes    Lack of Transportation (Non-Medical): Yes  Physical Activity: Inactive (01/13/2018)   Exercise Vital Sign    Days of Exercise per Week: 0 days  Minutes of Exercise per Session: 0 min  Stress: Stress Concern Present (01/13/2018)   [employer] of Occupational Health - Occupational Stress Questionnaire    Feeling of Stress : Rather much  Social Connections: Moderately Integrated (01/13/2018)   Social Connection and Isolation Panel [NHANES]    Frequency of Communication with Friends and Family: Twice a week    Frequency of Social Gatherings with Friends and Family: Once a week    Attends Religious Services: 1 to 4 times per year    Active Member of [employer] or Organizations: No    Attends Banker Meetings: Never    Marital Status: Living with partner  Intimate Partner Violence: Unknown (06/25/2018)   Humiliation, Afraid, Rape, and Kick questionnaire    Fear of Current or Ex-Partner: Patient declined    Emotionally Abused: Patient declined    Physically Abused: Patient declined    Sexually Abused: Patient declined   FH:  Family History  Problem Relation Age of Onset   Stroke Mother    Hypertension Mother    Colon cancer Father    Diabetes Father    Heart attack Father    Sarcoidosis Other    Breast cancer Paternal Aunt    Emphysema Brother    Lung disease Brother        Unknown type, 3 brothers, one with liver and lung disease   Diabetes Brother    Asthma Paternal  Aunt    Diabetes Sister     Current Outpatient Medications  Medication Sig Dispense Refill   ACCU-CHEK AVIVA PLUS test strip USE 1 STRIP TO CHECK BLOOD SUGAR 3 TIMES A DAY . 100 each 0   acetaminophen (TYLENOL) 500 MG tablet Take 1,000 mg by mouth every 6 (six) hours as needed for moderate pain or headache.     albuterol (PROVENTIL) (2.5 MG/3ML) 0.083% nebulizer solution USE ONE VIAL BY NEBULIZATION EVERY 6 (SIX) HOURS AS NEEDED FOR WHEEZING OR SHORTNESS OF BREATH. 90 mL 0   apixaban (ELIQUIS) 5 MG TABS tablet Take 1 tablet (5 mg total) by mouth 2 (two) times daily. 60 tablet 3   ascorbic acid (VITAMIN C) 500 MG tablet TAKE ONE TABLET BY MOUTH ONCE DAILY 90 tablet 3   aspirin EC (ASPIRIN LOW DOSE) 81 MG tablet TAKE ONE TABLET BY MOUTH ONCE DAILY 90 tablet 0   atorvastatin (LIPITOR) 10 MG tablet Take 1 tablet (10 mg total) by mouth daily. 90 tablet 3   BIDIL 20-37.5 MG tablet TAKE 1 TABLET BY MOUTH 3 (THREE) TIMES DAILY. 270 tablet 3   Blood Glucose Monitoring Suppl (ACCU-CHEK GUIDE) w/Device KIT Use accu chek guide to check blood sugar three times daily. 1 kit 0   Continuous Blood Gluc Sensor (FREESTYLE LIBRE 2 SENSOR) MISC USE ONE SENSOR EVERY 14 (FOURTEEN) DAYS. 2 each 0   D3-1000 25 MCG (1000 UT) tablet Take 1,000 Units by mouth 2 (two) times daily.     empagliflozin (JARDIANCE) 10 MG TABS tablet TAKE ONE TABLET BY MOUTH ONCE DAILY BEFORE BREAKFAST 90 tablet 3   FEROSUL 325 (65 Fe) MG tablet TAKE 1 TABLET (325 MG TOTAL) BY MOUTH DAILY AFTER SUPPER. PLEASE TAKE WITH A SOURCE OF VITAMIN C 90 tablet 3   fluticasone (FLONASE) 50 MCG/ACT nasal spray Place 1 spray into both nostrils daily. 16 g 6   hydrocerin (EUCERIN) CREA Apply 1 Application topically 2 (two) times daily.  0   insulin degludec (TRESIBA FLEXTOUCH) 200 UNIT/ML FlexTouch Pen INJECT 80 UNITS INTO  THE SKIN DAILY. 9 mL 0   Insulin Pen Needle (EASY COMFORT PEN NEEDLES) 31G X 5 MM MISC USE 3 TIMES A DAY FOR INSULIN ADMINISTRATION 100  each 12   ipratropium-albuterol (DUONEB) 0.5-2.5 (3) MG/3ML SOLN Take 3 mLs by nebulization every 6 (six) hours as needed. 360 mL 3   NEEDLE, DISP, 26 G 26G X 1/2" MISC 1 application by Does not apply route daily. 100 each 0   omeprazole (PRILOSEC) 40 MG capsule TAKE 1 CAPSULE (40 MG TOTAL) BY MOUTH DAILY. 90 capsule 0   polyethylene glycol (MIRALAX / GLYCOLAX) 17 g packet Take 17 g by mouth daily as needed. 14 each 6   RESTASIS 0.05 % ophthalmic emulsion Place 1 drop into both eyes daily as needed (dry eyes).     sacubitril-valsartan (ENTRESTO) 49-51 MG Take 1 tablet by mouth 2 (two) times daily. 90 tablet 3   Semaglutide, 2 MG/DOSE, (OZEMPIC, 2 MG/DOSE,) 8 MG/3ML SOPN INJECT 2 MG INTO THE SKIN ONCE A WEEK. 3 mL 0   senna-docusate (SENOKOT-S) 8.6-50 MG tablet Take 1 tablet by mouth 2 (two) times daily. 30 tablet 0   torsemide (DEMADEX) 100 MG tablet Take 1 tablet (100 mg total) by mouth in the morning AND 0.5 tablets (50 mg total) every evening. 45 tablet 3   umeclidinium-vilanterol (ANORO ELLIPTA) 62.5-25 MCG/ACT AEPB Inhale 1 puff into the lungs daily. 60 each 11   vitamin B-12 (CYANOCOBALAMIN) 1000 MCG tablet Take 1,000 mcg by mouth daily.     albuterol (PROVENTIL) (2.5 MG/3ML) 0.083% nebulizer solution Take 3 mLs (2.5 mg total) by nebulization every 6 (six) hours as needed for wheezing or shortness of breath. (Patient not taking: Reported on 09/27/2022) 120 mL 0   spironolactone (ALDACTONE) 25 MG tablet Take 25 mg by mouth daily. (Patient not taking: Reported on 09/27/2022)     No current facility-administered medications for this encounter.   BP (!) 144/72   Pulse 71   Ht 5\' 7"  (1.702 m)   Wt (!) 147 kg (324 lb)   LMP 05/30/2013   SpO2 99%   BMI 50.75 kg/m   Wt Readings from Last 3 Encounters:  09/27/22 (!) 147 kg (324 lb)  09/03/22 (!) 147.5 kg (325 lb 3.2 oz)  08/03/22 (!) 146.5 kg (323 lb)   Physical Exam:   General:  NAD. No resp difficulty, arrived in [facility] on oxygen HEENT:  Normal Neck: Supple. No JVD, thick neck. Carotids 2+ bilat; no bruits. No lymphadenopathy or thryomegaly appreciated. Cor: PMI nondisplaced. Regular rate & rhythm. No rubs, gallops or murmurs. Lungs: Clear Abdomen: Soft, nontender, nondistended. No hepatosplenomegaly. No bruits or masses. Good bowel sounds. Extremities: No cyanosis, clubbing, rash, trace BLE pre-tibial edema Neuro: Alert & oriented x 3, cranial nerves grossly intact. Moves all 4 extremities w/o difficulty. Affect pleasant.  Assessment & Plan: 1. H/O PEA arrest: Due to severe hypoxemia in 5/20 in setting of CHF exacerbation.   2. Chronic systolic CHF: Suspect ischemic cardiomyopathy.  Medtronic ICD.  Echo in 1/20 with EF 40-45%, RV moderately dilated, PASP 71 mmHg.  Echo in 5/20 with lower EF, 30-35%, and mildly dilated RV.  Suspect significant RV failure, may be related to OHS/OSA and COPD. She was massively volume overloaded during 5/20 admission and diuresed extensively.  RHC done 11/06/18 showed high cardiac output and minimally elevated filling pressures after diuresis.  PVR was not elevated, appeared to be high output PH.  RHC was done again in 9/20, filling pressures were elevated  with preserved cardiac output.  Echo in 10/20 showed EF still 30-35%.  Echo in 2/23 with EF 30-35% RV normal. Chronic NYHA III, she is not volume overloaded on exam or by OptiVol. - Continue torsemide from 100 mg /50 qpm.  BMET, magnesium and BNP today. - Continue Jardiance 10 mg daily. No GU symptoms. - Continue Bidil 1 tab tid.   - Continue Entresto 49/51 bid. Will not increase dose today as she has not had her morning medications. - Off spiro per Nephrology. - Off bisoprolol with bradycardia and junctional rhythm. Will not add back today. - Give Rx for compression hose. - Update echo next visit. 3. CKD: Stage III: Continue SGLT2i. BMET today. 4. COPD: On home oxygen at baseline.   - Continues to smoke a few cigarettes.  5. OHS/OSA: On home  oxygen at all times and wears Bipap at night. She is followed by Pulmonary. 6. CAD: History of PCI, most recently had inferior STEMI in 5/15 with DES to RCA.  Coronary angiography was done in 9/20, there was 75% ostial D1 stenosis but no good target for PCI. No chest pain.   - Continue ASA 81 and atorvastatin 10 mg daily. Check lipids today. 7. Remote PE: She had not been anticoagulated.  8. Anemia: History of GI bleeding, had endoscopies earlier in 2021 with no bleeding source found.  Also with history of B12 deficiency. She has seen Dr. Leonides Schanz for this. 9. Pulmonary hypertension: Pulmonary venous hypertension on last RHC in 9/20.  10. Obesity: Body mass index is 50.75 kg/m. Weight coming down.   - Continue semaglutide.  11. Atrial fibrillation: No recent episodes on device interrogation. NSR on ECG today. - She is followed by EP. - Continue Eliquis. No bleeding issues, recent CBC stable w/ Hgb 10.6  Follow up in 3 months with Dr. Shirlee Latch + echo.  Anderson Malta [facility] FNP-BC  09/27/2022

## 2022-09-24 NOTE — Telephone Encounter (Signed)
Called to confirm/remind patient of their appointment at the [facility] on 09/27/22.   Patient reminded to bring all medications and/or complete list.  Confirmed patient has transportation. Gave directions, instructed to utilize valet parking.  Confirmed appointment prior to ending call.

## 2022-09-27 ENCOUNTER — Other Ambulatory Visit: Payer: Self-pay | Admitting: Internal Medicine

## 2022-09-27 ENCOUNTER — Telehealth (HOSPITAL_COMMUNITY): Payer: Self-pay | Admitting: *Deleted

## 2022-09-27 ENCOUNTER — Encounter (HOSPITAL_COMMUNITY): Payer: Self-pay

## 2022-09-27 ENCOUNTER — Ambulatory Visit (INDEPENDENT_AMBULATORY_CARE_PROVIDER_SITE_OTHER): Payer: Medicaid Other

## 2022-09-27 ENCOUNTER — Ambulatory Visit (HOSPITAL_COMMUNITY)
Admission: RE | Admit: 2022-09-27 | Discharge: 2022-09-27 | Disposition: A | Discharge status: Home / Self Care | Payer: Medicaid Other | Source: Ambulatory Visit | Attending: Family Medicine | Admitting: Family Medicine

## 2022-09-27 VITALS — BP 144/72 | HR 71 | Ht 67.0 in | Wt 324.0 lb

## 2022-09-27 DIAGNOSIS — G4733 Obstructive sleep apnea (adult) (pediatric): Secondary | ICD-10-CM | POA: Diagnosis not present

## 2022-09-27 DIAGNOSIS — I4891 Unspecified atrial fibrillation: Secondary | ICD-10-CM | POA: Insufficient documentation

## 2022-09-27 DIAGNOSIS — Z5941 Food insecurity: Secondary | ICD-10-CM | POA: Insufficient documentation

## 2022-09-27 DIAGNOSIS — I5022 Chronic systolic (congestive) heart failure: Secondary | ICD-10-CM

## 2022-09-27 DIAGNOSIS — J449 Chronic obstructive pulmonary disease, unspecified: Secondary | ICD-10-CM | POA: Insufficient documentation

## 2022-09-27 DIAGNOSIS — Z79899 Other long term (current) drug therapy: Secondary | ICD-10-CM | POA: Diagnosis not present

## 2022-09-27 DIAGNOSIS — Z7901 Long term (current) use of anticoagulants: Secondary | ICD-10-CM | POA: Diagnosis not present

## 2022-09-27 DIAGNOSIS — Z8673 Personal history of transient ischemic attack (TIA), and cerebral infarction without residual deficits: Secondary | ICD-10-CM | POA: Insufficient documentation

## 2022-09-27 DIAGNOSIS — I130 Hypertensive heart and chronic kidney disease with heart failure and stage 1 through stage 4 chronic kidney disease, or unspecified chronic kidney disease: Secondary | ICD-10-CM | POA: Diagnosis not present

## 2022-09-27 DIAGNOSIS — Z95810 Presence of automatic (implantable) cardiac defibrillator: Secondary | ICD-10-CM

## 2022-09-27 DIAGNOSIS — Z8674 Personal history of sudden cardiac arrest: Secondary | ICD-10-CM | POA: Diagnosis not present

## 2022-09-27 DIAGNOSIS — Z794 Long term (current) use of insulin: Secondary | ICD-10-CM | POA: Diagnosis not present

## 2022-09-27 DIAGNOSIS — Z9981 Dependence on supplemental oxygen: Secondary | ICD-10-CM | POA: Insufficient documentation

## 2022-09-27 DIAGNOSIS — E669 Obesity, unspecified: Secondary | ICD-10-CM | POA: Insufficient documentation

## 2022-09-27 DIAGNOSIS — Z86711 Personal history of pulmonary embolism: Secondary | ICD-10-CM | POA: Diagnosis not present

## 2022-09-27 DIAGNOSIS — D649 Anemia, unspecified: Secondary | ICD-10-CM | POA: Insufficient documentation

## 2022-09-27 DIAGNOSIS — Z955 Presence of coronary angioplasty implant and graft: Secondary | ICD-10-CM | POA: Insufficient documentation

## 2022-09-27 DIAGNOSIS — I2510 Atherosclerotic heart disease of native coronary artery without angina pectoris: Secondary | ICD-10-CM

## 2022-09-27 DIAGNOSIS — F17210 Nicotine dependence, cigarettes, uncomplicated: Secondary | ICD-10-CM | POA: Insufficient documentation

## 2022-09-27 DIAGNOSIS — E785 Hyperlipidemia, unspecified: Secondary | ICD-10-CM | POA: Insufficient documentation

## 2022-09-27 DIAGNOSIS — Z5986 Financial insecurity: Secondary | ICD-10-CM | POA: Insufficient documentation

## 2022-09-27 DIAGNOSIS — N183 Chronic kidney disease, stage 3 unspecified: Secondary | ICD-10-CM | POA: Diagnosis not present

## 2022-09-27 DIAGNOSIS — E1122 Type 2 diabetes mellitus with diabetic chronic kidney disease: Secondary | ICD-10-CM | POA: Insufficient documentation

## 2022-09-27 DIAGNOSIS — Z7982 Long term (current) use of aspirin: Secondary | ICD-10-CM | POA: Diagnosis not present

## 2022-09-27 DIAGNOSIS — E1165 Type 2 diabetes mellitus with hyperglycemia: Secondary | ICD-10-CM

## 2022-09-27 DIAGNOSIS — Z6841 Body Mass Index (BMI) 40.0 and over, adult: Secondary | ICD-10-CM | POA: Insufficient documentation

## 2022-09-27 DIAGNOSIS — I469 Cardiac arrest, cause unspecified: Secondary | ICD-10-CM

## 2022-09-27 DIAGNOSIS — I272 Pulmonary hypertension, unspecified: Secondary | ICD-10-CM | POA: Insufficient documentation

## 2022-09-27 DIAGNOSIS — I252 Old myocardial infarction: Secondary | ICD-10-CM | POA: Insufficient documentation

## 2022-09-27 LAB — BASIC METABOLIC PANEL
Anion gap: 10 (ref 5–15)
BUN: 27 mg/dL — ABNORMAL HIGH (ref 8–23)
CO2: 28 mmol/L (ref 22–32)
Calcium: 9 mg/dL (ref 8.9–10.3)
Chloride: 102 mmol/L (ref 98–111)
Creatinine, Ser: 1.59 mg/dL — ABNORMAL HIGH (ref 0.44–1.00)
GFR, Estimated: 37 mL/min — ABNORMAL LOW (ref >60)
Glucose, Bld: 106 mg/dL — ABNORMAL HIGH (ref 70–99)
Potassium: 3.8 mmol/L (ref 3.5–5.1)
Sodium: 140 mmol/L (ref 135–145)

## 2022-09-27 LAB — LIPID PANEL
Cholesterol: 176 mg/dL (ref 0–200)
HDL: 67 mg/dL (ref >40)
LDL Cholesterol: 97 mg/dL (ref 0–99)
Total CHOL/HDL Ratio: 2.6 RATIO
Triglycerides: 62 mg/dL (ref <150)
VLDL: 12 mg/dL (ref 0–40)

## 2022-09-27 LAB — MAGNESIUM: Magnesium: 2 mg/dL (ref 1.7–2.4)

## 2022-09-27 LAB — BRAIN NATRIURETIC PEPTIDE: B Natriuretic Peptide: 117.3 pg/mL — ABNORMAL HIGH (ref 0.0–100.0)

## 2022-09-27 MED ORDER — ATORVASTATIN CALCIUM 40 MG PO TABS
40.0000 mg | ORAL_TABLET | Freq: Every day | ORAL | 3 refills | Status: DC
  Filled 2022-10-11 – 2022-12-17 (×2): qty 90, 90d supply, fill #0

## 2022-09-27 NOTE — Progress Notes (Signed)
EPIC Encounter for ICM Monitoring  Patient Name: Tamara Silva is a 62 y.o. female Date: 09/27/2022 Primary Care Physican: Marcine Matar, MD Primary Cardiologist: Shirlee Latch Electrophysiologist: Elberta Fortis 10/22/2021 Weight:  328 lbs 12/18/2021 Weight: 319 lbs 03/29/2022 Office Weight: 324 lbs 05/28/2022 Office Weight: 313 lbs 07/30/2022 Office Weight: 318 lbs   Since 02-Aug-2022 Time in AF    0.0 hr/day (0.0%)                                                            Transmission reviewed and fluid levels checked by Prince Rome, NP at HF clinic at today's, 4/29, OV.    Optivol thoracic impedance suggesting possible fluid accumulation starting 4/15 and trending back to baseline.         Prescribed:  Torsemide 100 mg take 1 tablet (100 mg total) by mouth every morning and 0.5 tablet (50 mg total) every evening.  Pt gets cramping with 2nd Torsemide dosage. Spironolactone 25 mg take 1 tablet daily Jardiance 10 mg take 1 tablet daily before breakfast   Labs: 09/27/2022 Creatinine 1.59, UN 27, Potassium 3.8, Sodium 140, GFR 37 09/03/2022 Creatinine 1.49, BUN 33, Potassium 4.2, Sodium 143 08/02/2022 Creatinine 1.58, BUN 30, Potassium 4.3, Sodium 144, GFR 37 05/28/2022 Creatinine 2.03, BUN 33, Potassium 3.8, Sodium 141, GFR 27 A complete set of results can be found in Results Review.   Recommendations:   Recommendations given at 4/29 HF clinic OV.     Follow-up plan: ICM clinic phone appointment on 11/01/2022.   91 day device clinic remote transmission 10/19/2022.     EP/Cardiology Office Visits: 12/28/2022 with Dr Shirlee Latch.  Recall 03/02/2023 with Dr Elberta Fortis.   Copy of ICM check sent to Dr. Elberta Fortis.    3 month ICM trend: 09/27/2022.    12-14 Month ICM trend:     Karie Soda, RN 09/27/2022 1:52 PM

## 2022-09-27 NOTE — Patient Instructions (Signed)
No change in medications.  Start wearing compression hose - Rx provided. Labs drawn today - will call you if abnormal. Return to see Dr. Gala Romney with echo in 3 months. Please call us at [phone] if any questions or concerns prior to next visit.

## 2022-09-27 NOTE — Telephone Encounter (Signed)
Called patient per Prince Rome, NP with following lab results and instructions:  "Labs ok, but LDL higher. Increase atorvastatin to 40 mg daily. Will need repeat LFTs and lipids in 6-8 weeks".  Pt verbalized understanding of increasing medication; new Rx sent to local pharmacy. She does not want to schedule repeat labs as instructed. She asked that labs be checked at her next visit with Dr. Shirlee Latch. Appointment note added for same.

## 2022-09-28 NOTE — Telephone Encounter (Signed)
Requested Prescriptions  Pending Prescriptions Disp Refills   OZEMPIC, 2 MG/DOSE, 8 MG/3ML SOPN [Pharmacy Med Name: OZEMPIC (2 MG/DOSE) 8 MG/3ML SUBCUTANEOUS SOLUTION PEN-INJECTOR] 3 mL 0    Sig: INJECT 2 MG INTO THE SKIN ONCE A WEEK.     Endocrinology:  Diabetes - GLP-1 Receptor Agonists - semaglutide Failed - 09/27/2022 11:22 AM      Failed - Cr in normal range and within 360 days    Creatinine  Date Value Ref Range Status  12/20/2019 2.34 (H) 0.44 - 1.00 mg/dL Final   Creat  Date Value Ref Range Status  07/08/2015 0.89 0.50 - 1.05 mg/dL Final   Creatinine, Ser  Date Value Ref Range Status  09/27/2022 1.59 (H) 0.44 - 1.00 mg/dL Final   Creatinine, POC  Date Value Ref Range Status  10/06/2016 300 mg/dL Final   Creatinine,U  Date Value Ref Range Status  02/17/2021 56.2 mg/dL Final   Creatinine, Urine  Date Value Ref Range Status  05/29/2018 282.47 mg/dL Final    Comment:    Performed at [facility], [address], [place], [place] [address]         Passed - HBA1C in normal range and within 180 days    HbA1c, POC (prediabetic range)  Date Value Ref Range Status  01/23/2018 8 (A) 5.7 - 6.4 % Final   HbA1c, POC (controlled diabetic range)  Date Value Ref Range Status  08/03/2022 6.6 0.0 - 7.0 % Final         Passed - Valid encounter within last 6 months    Recent Outpatient Visits           1 month ago Type 2 diabetes mellitus with diabetic polyneuropathy, with long-term current use of insulin (HCC)   [facility] Marcine Matar, MD   9 months ago Establishing care with new doctor, encounter for   [facility] Jonah Blue B, MD   2 years ago Type 2 diabetes mellitus with diabetic polyneuropathy, with long-term current use of insulin [facility])   [place] [facility] Jonah Blue B, MD   2 years ago Chronic systolic CHF (congestive heart failure)  [facility])   [place] [facility] Jonah Blue B, MD   3 years ago Chronic systolic CHF (congestive heart failure) [facility])   [place] [facility] Marcine Matar, MD       Future Appointments             In 1 week Marcine Matar, MD [facility]

## 2022-09-29 ENCOUNTER — Telehealth: Payer: Self-pay

## 2022-09-29 NOTE — Telephone Encounter (Signed)
Called patient unable to make contact or leave voicemail, information sent to the nurse pool  

## 2022-09-29 NOTE — Telephone Encounter (Signed)
-----   Message from Marcine Matar, MD sent at 09/24/2022 12:43 PM EDT ----- Mammogram is normal.  Will  be due again in 1 year.

## 2022-10-04 ENCOUNTER — Other Ambulatory Visit: Payer: Self-pay | Admitting: Internal Medicine

## 2022-10-04 ENCOUNTER — Telehealth: Payer: Self-pay | Admitting: Pharmacist

## 2022-10-05 ENCOUNTER — Ambulatory Visit: Payer: Medicaid Other | Attending: Internal Medicine | Admitting: Internal Medicine

## 2022-10-05 ENCOUNTER — Other Ambulatory Visit (HOSPITAL_COMMUNITY)
Admission: RE | Admit: 2022-10-05 | Discharge: 2022-10-05 | Disposition: A | Discharge status: Home / Self Care | Payer: Medicaid Other | Source: Ambulatory Visit | Attending: Internal Medicine | Admitting: Internal Medicine

## 2022-10-05 ENCOUNTER — Other Ambulatory Visit: Payer: Self-pay | Admitting: Pharmacist

## 2022-10-05 DIAGNOSIS — Z124 Encounter for screening for malignant neoplasm of cervix: Secondary | ICD-10-CM | POA: Insufficient documentation

## 2022-10-05 DIAGNOSIS — Z23 Encounter for immunization: Secondary | ICD-10-CM

## 2022-10-05 MED ORDER — POLYETHYLENE GLYCOL 3350 17 G PO PACK: 17.0000 g | PACK | Freq: Every day | ORAL | 6 refills | Status: DC | PRN

## 2022-10-05 MED ORDER — HYDROCERIN EX CREA: 1.0000 | TOPICAL_CREAM | Freq: Two times a day (BID) | CUTANEOUS | 0 refills | Status: DC

## 2022-10-05 MED ORDER — TRESIBA FLEXTOUCH 200 UNIT/ML ~~LOC~~ SOPN
80.0000 [IU] | PEN_INJECTOR | Freq: Every day | SUBCUTANEOUS | 3 refills | Status: DC
  Filled 2022-10-20: qty 9, 23d supply, fill #0
  Filled 2022-11-08: qty 9, 23d supply, fill #1
  Filled 2022-12-07 – 2022-12-14 (×4): qty 9, 23d supply, fill #2
  Filled 2023-01-06: qty 9, 23d supply, fill #3

## 2022-10-05 MED ORDER — OMEPRAZOLE 40 MG PO CPDR: 40.0000 mg | DELAYED_RELEASE_CAPSULE | Freq: Every day | ORAL | 1 refills | Status: DC

## 2022-10-05 NOTE — Progress Notes (Unsigned)
Patient ID: Tamara Silva, female    DOB: 1961-01-20  MRN: [id number]  CC: Gynecologic Exam (Pap. Pt reports that she she has not received a new canula from [facility]. Requesting a prescription for Mucinex DM & Sennalax.)   Subjective: Tamara Silva is a 62 y.o. female who presents for PAP Her concerns today include:  Pt with hx of HTN, combined chronic CHF (EF30-35% 07/2021), cardiac arrest, ICD, CAD (followed by Dr. Jearld Pies), CVA, chronic PE, OHS ( onBiPAP), COPD, hypoxic respiratory failure on home O2 3 Lt continuous,  DM with neuropathy, obesity, CKD stage 3-4 (Dr. Vallery Sa), former tob dep, IDA/ACD (history GI bleed with endoscopies 07/2018 - poor prep of colon on c-scope) Vit B12 def, polyclonal gammopathy (followed by Dr. Leonides Schanz).   GYN History:  Pt is G2P1 (1 miscarriage) Any hx of abn paps?: no Menses regular or irregular?:  Menopausal How long does menses last? NA Menstrual flow light or heavy?: NA Method of birth control?:  NA Any vaginal dischg at this time?:  No Dysuria?: no Any hx of STI?:  Many years ago but does not recall what it was.  It was treated Sexually active with how many partners:  one female partner Desires STI screen:  no Last MMG: 08/2022 Family hx of uterine, cervical or breast cancer?:  breast cancer in paternal aunt.  Needs RF on several meds including Tresiba.  HM:  due for shingles vaccine Patient Active Problem List   Diagnosis Date Noted   Sinus bradycardia 10/16/2021   Nausea & vomiting 10/16/2021   AKI (acute kidney injury) (HCC) 10/14/2021   Class 3 obesity (HCC) 10/14/2021   NPDR (nonproliferative diabetic retinopathy) (HCC) 08/06/2020   Anemia of chronic disease 01/25/2020   Coronary artery disease involving native coronary artery of native heart with angina pectoris (HCC) 11/22/2019   Chronic systolic (congestive) heart failure (HCC) 04/04/2019   Diabetic peripheral neuropathy (HCC) 01/19/2019   Former smoker 01/19/2019   CKD  (chronic kidney disease) stage 3, GFR 30-59 ml/min (HCC) 01/19/2019   Cardiac arrest (HCC) 10/28/2018   Noncompliance with treatment plan 07/24/2018   General patient noncompliance 07/24/2018   Noncompliance with diet and medication regimen 07/24/2018   Dysarthria    BiPAP (biphasic positive airway pressure) dependence    Type II diabetes mellitus with renal manifestations (HCC) 07/08/2018   Hepatic steatosis 06/22/2018   Chronic respiratory failure with hypoxia and hypercapnia (HCC)    On home O2    Slow transit constipation    Cocaine abuse (HCC)    Diabetes mellitus type 2 in obese    Cerebral embolism with cerebral infarction 06/01/2018   Pressure injury of skin 05/30/2018   Iron deficiency anemia 01/27/2018   Tobacco abuse disorder 10/03/2017   Dyslipidemia 04/29/2016   Primary insomnia 04/29/2016   Obesity hypoventilation syndrome (HCC)    OSA (obstructive sleep apnea)    Dyslipidemia associated with type 2 diabetes mellitus (HCC) 05/21/2015   COPD GOLD III  06/10/2014   CAD S/P percutaneous coronary angioplasty - prior PCI to LAD; RCA PCI: new Xience Alpine DES 2.75 mm x 15 mm  10/07/2013   Morbid obesity (HCC) 07/30/2013   Chronic combined systolic and diastolic heart failure (HCC) 07/10/2013   Bipolar disorder (HCC) 02/08/2013   Essential hypertension 02/08/2013   COPD (chronic obstructive pulmonary disease) (HCC) 10/15/2012     Current Outpatient Medications on File Prior to Visit  Medication Sig Dispense Refill   ACCU-CHEK AVIVA PLUS test strip USE 1  STRIP TO CHECK BLOOD SUGAR 3 TIMES A DAY . 100 each 0   acetaminophen (TYLENOL) 500 MG tablet Take 1,000 mg by mouth every 6 (six) hours as needed for moderate pain or headache.     albuterol (PROVENTIL) (2.5 MG/3ML) 0.083% nebulizer solution USE ONE VIAL BY NEBULIZATION EVERY 6 (SIX) HOURS AS NEEDED FOR WHEEZING OR SHORTNESS OF BREATH. 90 mL 0   apixaban (ELIQUIS) 5 MG TABS tablet Take 1 tablet (5 mg total) by mouth 2  (two) times daily. 60 tablet 3   ascorbic acid (VITAMIN C) 500 MG tablet TAKE ONE TABLET BY MOUTH ONCE DAILY 90 tablet 3   ASPIRIN LOW DOSE 81 MG tablet TAKE ONE TABLET BY MOUTH ONCE DAILY 90 tablet 0   atorvastatin (LIPITOR) 40 MG tablet Take 1 tablet (40 mg total) by mouth daily. 90 tablet 3   BIDIL 20-37.5 MG tablet TAKE 1 TABLET BY MOUTH 3 (THREE) TIMES DAILY. 270 tablet 3   Blood Glucose Monitoring Suppl (ACCU-CHEK GUIDE) w/Device KIT Use accu chek guide to check blood sugar three times daily. 1 kit 0   Continuous Blood Gluc Sensor (FREESTYLE LIBRE 2 SENSOR) MISC USE ONE SENSOR EVERY 14 (FOURTEEN) DAYS. 2 each 0   D3-1000 25 MCG (1000 UT) tablet Take 1,000 Units by mouth 2 (two) times daily.     empagliflozin (JARDIANCE) 10 MG TABS tablet TAKE ONE TABLET BY MOUTH ONCE DAILY BEFORE BREAKFAST 90 tablet 3   FEROSUL 325 (65 Fe) MG tablet TAKE 1 TABLET (325 MG TOTAL) BY MOUTH DAILY AFTER SUPPER. PLEASE TAKE WITH A SOURCE OF VITAMIN C 90 tablet 3   fluticasone (FLONASE) 50 MCG/ACT nasal spray Place 1 spray into both nostrils daily. 16 g 6   Insulin Pen Needle (EASY COMFORT PEN NEEDLES) 31G X 5 MM MISC USE 3 TIMES A DAY FOR INSULIN ADMINISTRATION 100 each 12   ipratropium-albuterol (DUONEB) 0.5-2.5 (3) MG/3ML SOLN Take 3 mLs by nebulization every 6 (six) hours as needed. 360 mL 3   NEEDLE, DISP, 26 G 26G X 1/2" MISC 1 application by Does not apply route daily. 100 each 0   OZEMPIC, 2 MG/DOSE, 8 MG/3ML SOPN INJECT 2 MG INTO THE SKIN ONCE A WEEK. 3 mL 0   RESTASIS 0.05 % ophthalmic emulsion Place 1 drop into both eyes daily as needed (dry eyes).     sacubitril-valsartan (ENTRESTO) 49-51 MG Take 1 tablet by mouth 2 (two) times daily. 90 tablet 3   senna-docusate (SENOKOT-S) 8.6-50 MG tablet Take 1 tablet by mouth 2 (two) times daily. 30 tablet 0   torsemide (DEMADEX) 100 MG tablet Take 1 tablet (100 mg total) by mouth in the morning AND 0.5 tablets (50 mg total) every evening. 45 tablet 3    umeclidinium-vilanterol (ANORO ELLIPTA) 62.5-25 MCG/ACT AEPB Inhale 1 puff into the lungs daily. 60 each 11   vitamin B-12 (CYANOCOBALAMIN) 1000 MCG tablet Take 1,000 mcg by mouth daily.     albuterol (PROVENTIL) (2.5 MG/3ML) 0.083% nebulizer solution Take 3 mLs (2.5 mg total) by nebulization every 6 (six) hours as needed for wheezing or shortness of breath. (Patient not taking: Reported on 09/27/2022) 120 mL 0   spironolactone (ALDACTONE) 25 MG tablet Take 25 mg by mouth daily. (Patient not taking: Reported on 09/27/2022)     No current facility-administered medications on file prior to visit.    Allergies  Allergen Reactions   Metolazone Other (See Comments)    Dizziness and falling Per patient in 2021, states  she does not think she fell because of this medication.   Ibuprofen Other (See Comments)    Pt states she is not supposed to take ibuprofen because of other meds she is taking   Nsaids Other (See Comments)    "I do not take NSAIDs d/t interfering with other meds"    Social History   Socioeconomic History   Marital status: Single    Spouse name: Not on file   Number of children: 1   Years of education: Not on file   Highest education level: Not on file  Occupational History   Not on file  Tobacco Use   Smoking status: Some Days    Packs/day: 0.50    Years: 20.00    Additional pack years: 0.00    Total pack years: 10.00    Types: Cigarettes    Last attempt to quit: 05/31/2018    Years since quitting: 4.3   Smokeless tobacco: Never   Tobacco comments:    smoked off and on x 20 years.  Still smokes some when stressed.  No sure how often or how much.  07/22/2022  hfb  Vaping Use   Vaping Use: Never used  Substance and Sexual Activity   Alcohol use: No    Alcohol/week: 0.0 standard drinks of alcohol   Drug use: Not Currently    Types: Cocaine   Sexual activity: Yes  Other Topics Concern   Not on file  Social History Narrative   Origibnally from [place]   Most recently  from [place]   Daughter lives in town      On Social security to CHF, COPD, CAD   Social Determinants of Health   Financial Resource Strain: Medium Risk (01/13/2018)   Overall Financial Resource Strain (CARDIA)    Difficulty of Paying Living Expenses: Somewhat hard  Food Insecurity: Food Insecurity Present (01/13/2018)   Hunger Vital Sign    Worried About Running Out of Food in the Last Year: Sometimes true    Ran Out of Food in the Last Year: Sometimes true  Transportation Needs: Unmet Transportation Needs (01/13/2018)   PRAPARE - Administrator, Civil Service (Medical): Yes    Lack of Transportation (Non-Medical): Yes  Physical Activity: Inactive (01/13/2018)   Exercise Vital Sign    Days of Exercise per Week: 0 days    Minutes of Exercise per Session: 0 min  Stress: Stress Concern Present (01/13/2018)   [employer] of Occupational Health - Occupational Stress Questionnaire    Feeling of Stress : Rather much  Social Connections: Moderately Integrated (01/13/2018)   Social Connection and Isolation Panel [NHANES]    Frequency of Communication with Friends and Family: Twice a week    Frequency of Social Gatherings with Friends and Family: Once a week    Attends Religious Services: 1 to 4 times per year    Active Member of [employer] or Organizations: No    Attends Banker Meetings: Never    Marital Status: Living with partner  Intimate Partner Violence: Unknown (06/25/2018)   Humiliation, Afraid, Rape, and Kick questionnaire    Fear of Current or Ex-Partner: Patient declined    Emotionally Abused: Patient declined    Physically Abused: Patient declined    Sexually Abused: Patient declined    Family History  Problem Relation Age of Onset   Stroke Mother    Hypertension Mother    Colon cancer Father    Diabetes Father    Heart attack  Father    Sarcoidosis Other    Breast cancer Paternal Aunt    Emphysema Brother    Lung disease Brother         Unknown type, 3 brothers, one with liver and lung disease   Diabetes Brother    Asthma Paternal Aunt    Diabetes Sister     Past Surgical History:  Procedure Laterality Date   BIOPSY  07/10/2018   Procedure: BIOPSY;  Surgeon: Kerin Salen, MD;  Location: [facility] ENDOSCOPY;  Service: Gastroenterology;;   COLONOSCOPY WITH PROPOFOL N/A 08/03/2013   Procedure: COLONOSCOPY WITH PROPOFOL;  Surgeon: Petra Kuba, MD;  Location: [facility] ENDOSCOPY;  Service: Endoscopy;  Laterality: N/A;   COLONOSCOPY WITH PROPOFOL N/A 07/10/2018   Procedure: COLONOSCOPY WITH PROPOFOL;  Surgeon: Kerin Salen, MD;  Location: [facility] ENDOSCOPY;  Service: Gastroenterology;  Laterality: N/A;   CORONARY ANGIOPLASTY WITH STENT PLACEMENT     CAD in 2006 x 2 and 2009 2 more- place din REx in [place] and [place] med   CORONARY ANGIOPLASTY WITH STENT PLACEMENT  10/07/2013   Xience Alpine DES 2.75  mm x 15  mm   ESOPHAGOGASTRODUODENOSCOPY (EGD) WITH PROPOFOL N/A 08/03/2013   Procedure: ESOPHAGOGASTRODUODENOSCOPY (EGD) WITH PROPOFOL;  Surgeon: Petra Kuba, MD;  Location: [facility] ENDOSCOPY;  Service: Endoscopy;  Laterality: N/A;   ESOPHAGOGASTRODUODENOSCOPY (EGD) WITH PROPOFOL N/A 07/10/2018   Procedure: ESOPHAGOGASTRODUODENOSCOPY (EGD) WITH PROPOFOL;  Surgeon: Kerin Salen, MD;  Location: [facility] ENDOSCOPY;  Service: Gastroenterology;  Laterality: N/A;   ICD IMPLANT  04/04/2019   ICD IMPLANT N/A 04/04/2019   Procedure: ICD IMPLANT;  Surgeon: Regan Lemming, MD;  Location: [facility] INVASIVE CV LAB;  Service: Cardiovascular;  Laterality: N/A;   LEFT HEART CATHETERIZATION WITH CORONARY ANGIOGRAM N/A 10/07/2013   Procedure: LEFT HEART CATHETERIZATION WITH CORONARY ANGIOGRAM;  Surgeon: Marykay Lex, MD;  Location: [facility] CATH LAB;  Service: Cardiovascular;  Laterality: N/A;   RIGHT HEART CATH N/A 11/06/2018   Procedure: RIGHT HEART CATH;  Surgeon: Laurey Morale, MD;  Location: [facility] INVAS[facility] CV LAB;  Service: Cardiovascular;  Laterality: N/A;   RIGHT/LEFT HEART CATH AND  CORONARY ANGIOGRAPHY N/A 02/12/2019   Procedure: RIGHT/LEFT HEART CATH AND CORONARY ANGIOGRAPHY;  Surgeon: Laurey Morale, MD;  Location: [facility] INVASIVE CV LAB;  Service: Cardiovascular;  Laterality: N/A;    ROS: Review of Systems Negative except as stated above  PHYSICAL EXAM: LMP 05/30/2013   Physical Exam  General appearance - alert, well appearing, obese older female and in no distress Mental status - normal mood, behavior, speech, dress, motor activity, and thought processes Pelvic - CMA Clarrisa present:  normal external genitalia, vulva, vagina, cervix, uterus and adnexa      Latest Ref Rng & Units 09/27/2022    9:30 AM 09/03/2022    9:34 AM 08/20/2022    9:46 AM  CMP  Glucose 70 - 99 mg/dL 130  93    BUN 8 - 23 mg/dL 27  33    Creatinine 8.65 - 1.00 mg/dL 7.84  6.96    Sodium 295 - 145 mmol/L 140  143    Potassium 3.5 - 5.1 mmol/L 3.8  4.2    Chloride 98 - 111 mmol/L 102  102    CO2 22 - 32 mmol/L 28  24    Calcium 8.9 - 10.3 mg/dL 9.0  9.2    Total Protein 6.0 - 8.5 g/dL   8.0   Total Bilirubin 0.0 - 1.2 mg/dL   <2.8  Alkaline Phos 44 - 121 IU/L   85   AST 0 - 40 IU/L   14   ALT 0 - 32 IU/L   13    Lipid Panel     Component Value Date/Time   CHOL 176 09/27/2022 0930   TRIG 62 09/27/2022 0930   HDL 67 09/27/2022 0930   CHOLHDL 2.6 09/27/2022 0930   VLDL 12 09/27/2022 0930   LDLCALC 97 09/27/2022 0930    CBC    Component Value Date/Time   WBC 9.1 09/03/2022 0934   WBC 4.4 10/15/2021 0647   RBC 4.24 09/03/2022 0934   RBC 4.14 10/15/2021 0647   HGB 10.6 (L) 09/03/2022 0934   HCT 34.4 09/03/2022 0934   PLT 319 09/03/2022 0934   MCV 81 09/03/2022 0934   MCH 25.0 (L) 09/03/2022 0934   MCH 25.1 (L) 10/15/2021 0647   MCHC 30.8 (L) 09/03/2022 0934   MCHC 29.9 (L) 10/15/2021 0647   RDW 15.9 (H) 09/03/2022 0934   LYMPHSABS 0.7 10/14/2021 0901   LYMPHSABS 3.4 (H) 11/30/2018 1346   MONOABS 0.3 10/14/2021 0901   EOSABS 0.0 10/14/2021 0901   EOSABS 0.2  11/30/2018 1346   BASOSABS 0.0 10/14/2021 0901   BASOSABS 0.1 11/30/2018 1346    ASSESSMENT AND PLAN: 1. Pap smear for cervical cancer screening - Cytology - PAP  2. Need for shingles vaccine 1st Shingrix given today.   Rfs given on Omeprazole, Evaristo Bury, Miralax  Patient was given the opportunity to ask questions.  Patient verbalized understanding of the plan and was able to repeat key elements of the plan.   This documentation was completed using Paediatric nurse.  Any transcriptional errors are unintentional.  Orders Placed This Encounter  Procedures   Varicella-zoster vaccine IM     Requested Prescriptions   Signed Prescriptions Disp Refills   omeprazole (PRILOSEC) 40 MG capsule 90 capsule 1    Sig: Take 1 capsule (40 mg total) by mouth daily.   polyethylene glycol (MIRALAX / GLYCOLAX) 17 g packet 14 each 6    Sig: Take 17 g by mouth daily as needed.   insulin degludec (TRESIBA FLEXTOUCH) 200 UNIT/ML FlexTouch Pen 9 mL 3    Sig: Inject 80 Units into the skin daily.   hydrocerin (EUCERIN) CREA  0    Sig: Apply 1 Application topically 2 (two) times daily.    Return in about 2 months (around 12/05/2022) for chronic ds management.  Jonah Blue, MD, FACP

## 2022-10-05 NOTE — Progress Notes (Signed)
   Tamara Silva 11/24/1960 [id number]  Patient outreached by Thyra Breed, PharmD Candidate on 5/7 while they were picking up prescriptions at [facility].  Reports that when blood pressure was last checked at HF clinic, her transportation picked her up earlier than anticipated so she was unable to take medications before visits.   Blood Pressure Readings: Last documented ambulatory systolic blood pressure: 144 Last documented ambulatory diastolic blood pressure: 72 Does the patient have a validated home blood pressure machine?: Yes They report home readings 120s/70  Medication review was performed. Is the patient taking their medications as prescribed?: Yes Differences from their prescribed list include: patient notes nephrology discontinued spironolactone  The following barriers to adherence were noted: Does the patient have cost concerns?: Yes Does the patient have transportation concerns?: No Does the patient need assistance obtaining refills?: Yes Does the patient occassionally forget to take some of their prescribed medications?: No Does the patient have questions or concerns about their medications?: No Does the patient have a follow up scheduled with their primary care provider/cardiologist?: Yes   Interventions: Interventions Completed: Medications were reviewed, Patient was educated on goal blood pressures and long term health implications of elevated blood pressure  The patient has follow up scheduled:  PCP: Marcine Matar, MD - this afternoon. Confirms transportation.   Requests refills on senna and miralax to [facility]. Will notify PCP.   Catie Eppie Gibson, PharmD, BCACP, CPP [facility] [phone]

## 2022-10-05 NOTE — Progress Notes (Signed)
Completed call.  

## 2022-10-06 ENCOUNTER — Telehealth: Payer: Self-pay

## 2022-10-06 DIAGNOSIS — Z23 Encounter for immunization: Secondary | ICD-10-CM | POA: Diagnosis not present

## 2022-10-06 NOTE — Telephone Encounter (Signed)
Shingrix vaccine administered per protocols. Given left deltoid  Information sheet given. Patient denies and pain or discomfort at injection site. Tolerated injection well no reaction.

## 2022-10-07 LAB — CYTOLOGY - PAP
Comment: NEGATIVE
Diagnosis: NEGATIVE
High risk HPV: NEGATIVE

## 2022-10-11 ENCOUNTER — Other Ambulatory Visit: Payer: Self-pay

## 2022-10-11 MED ORDER — OMEPRAZOLE 40 MG PO CPDR
40.0000 mg | DELAYED_RELEASE_CAPSULE | Freq: Every day | ORAL | 0 refills | Status: DC
  Filled 2022-10-11 – 2022-12-24 (×2): qty 30, 30d supply, fill #0

## 2022-10-11 MED ORDER — IPRATROPIUM-ALBUTEROL 0.5-2.5 (3) MG/3ML IN SOLN
3.0000 mL | Freq: Four times a day (QID) | RESPIRATORY_TRACT | 5 refills | Status: DC | PRN
  Filled 2022-10-11 – 2022-10-20 (×3): qty 360, 30d supply, fill #0

## 2022-10-11 MED FILL — Isosorbide Dinitrate-Hydralazine HCl Tab 20-37.5 MG: ORAL | 30 days supply | Qty: 90 | Fill #0 | Status: AC

## 2022-10-11 MED FILL — Aspirin Tab Delayed Release 81 MG: ORAL | 30 days supply | Qty: 30 | Fill #0 | Status: CN

## 2022-10-12 ENCOUNTER — Encounter: Payer: Self-pay | Admitting: Cardiology

## 2022-10-18 ENCOUNTER — Other Ambulatory Visit: Payer: Self-pay

## 2022-10-18 MED ORDER — ATORVASTATIN CALCIUM 10 MG PO TABS
10.0000 mg | ORAL_TABLET | Freq: Every day | ORAL | 3 refills | Status: DC
  Filled 2022-10-18: qty 30, 30d supply, fill #0
  Filled 2022-11-16: qty 30, 30d supply, fill #1
  Filled 2022-12-24: qty 30, 30d supply, fill #2

## 2022-10-18 MED ORDER — VITAMIN D 25 MCG (1000 UNIT) PO TABS: 50.0000 ug | ORAL_TABLET | Freq: Every day | ORAL | 3 refills | Status: DC

## 2022-10-19 ENCOUNTER — Ambulatory Visit (INDEPENDENT_AMBULATORY_CARE_PROVIDER_SITE_OTHER): Payer: Medicaid Other

## 2022-10-19 ENCOUNTER — Other Ambulatory Visit: Payer: Self-pay

## 2022-10-19 DIAGNOSIS — I5022 Chronic systolic (congestive) heart failure: Secondary | ICD-10-CM | POA: Diagnosis not present

## 2022-10-20 ENCOUNTER — Other Ambulatory Visit: Payer: Self-pay

## 2022-10-20 ENCOUNTER — Telehealth: Payer: Self-pay | Admitting: Adult Health

## 2022-10-20 ENCOUNTER — Other Ambulatory Visit: Payer: Self-pay | Admitting: Internal Medicine

## 2022-10-20 DIAGNOSIS — J449 Chronic obstructive pulmonary disease, unspecified: Secondary | ICD-10-CM

## 2022-10-20 DIAGNOSIS — Z794 Long term (current) use of insulin: Secondary | ICD-10-CM

## 2022-10-20 LAB — CUP PACEART REMOTE DEVICE CHECK
Battery Remaining Longevity: 110 mo
Battery Voltage: 3.02 V
Brady Statistic RV Percent Paced: 0.01 %
Date Time Interrogation Session: 20240521223825
HighPow Impedance: 55 Ohm
Implantable Lead Connection Status: 753985
Implantable Lead Implant Date: 20201104
Implantable Lead Location: 753860
Implantable Pulse Generator Implant Date: 20201104
Lead Channel Impedance Value: 304 Ohm
Lead Channel Impedance Value: 323 Ohm
Lead Channel Pacing Threshold Amplitude: 0.5 V
Lead Channel Pacing Threshold Pulse Width: 0.4 ms
Lead Channel Sensing Intrinsic Amplitude: 13.75 mV
Lead Channel Sensing Intrinsic Amplitude: 13.75 mV
Lead Channel Setting Pacing Amplitude: 2.5 V
Lead Channel Setting Pacing Pulse Width: 0.4 ms
Lead Channel Setting Sensing Sensitivity: 0.3 mV
Zone Setting Status: 755011
Zone Setting Status: 755011

## 2022-10-20 MED ORDER — FREESTYLE LIBRE 2 SENSOR MISC
11 refills | Status: AC
  Filled 2022-10-20: qty 2, 28d supply, fill #0
  Filled 2022-11-11 – 2022-11-16 (×3): qty 2, 28d supply, fill #1
  Filled 2022-12-17: qty 2, 28d supply, fill #2

## 2022-10-20 NOTE — Telephone Encounter (Signed)
Message from pharmacy , patient is using her Duoneb 6 x daily .  Advised that she should only use Four times a day  As needed   Needs an rx bc she is out after today. Can send in rx for next 2 weeks, may have to pay out of pocket for this as insurance will not cover until 10/30/22.  Please call patient to discuss

## 2022-10-21 ENCOUNTER — Other Ambulatory Visit: Payer: Self-pay

## 2022-10-21 ENCOUNTER — Other Ambulatory Visit (HOSPITAL_COMMUNITY): Payer: Self-pay

## 2022-10-22 ENCOUNTER — Other Ambulatory Visit: Payer: Self-pay

## 2022-10-22 MED ORDER — IPRATROPIUM-ALBUTEROL 0.5-2.5 (3) MG/3ML IN SOLN
3.0000 mL | Freq: Four times a day (QID) | RESPIRATORY_TRACT | 0 refills | Status: DC | PRN
Start: 2022-10-22 — End: 2022-11-18
  Filled 2022-10-22: qty 90, 8d supply, fill #0
  Filled 2022-10-22: qty 360, 30d supply, fill #0
  Filled 2022-10-28 – 2022-11-01 (×3): qty 90, 8d supply, fill #1
  Filled 2022-11-01: qty 270, 23d supply, fill #1

## 2022-10-22 MED ORDER — PREDNISONE 10 MG PO TABS
ORAL_TABLET | ORAL | 0 refills | Status: AC
  Filled 2022-10-22: qty 20, 8d supply, fill #0

## 2022-10-22 NOTE — Telephone Encounter (Signed)
Spoke with patient. Advised prednisone has been sent to pharmacy. Recall in place for PFT and F/U nvn

## 2022-10-22 NOTE — Telephone Encounter (Signed)
I will send in prednisone taper. Needs first available apt since she was last seen in February. She has stage 3 COPD. May need to change her maintenance regimen

## 2022-10-22 NOTE — Telephone Encounter (Signed)
Called and spoke with pt about the message received from TP after message received from pharmacy.   Told pt that she needs to make sure she uses the duoneb 4 times daily as needed as when she uses it 6 times daily, she is overusing the solution.  Pt said that she has had to use it 6 times daily as she does nt have a rescue inhaler. Pt said the only rescue inhaler that worked for her was Scientist, water quality was taken off of the market. Pt said she has tried proventil as well as ventolin and neither of those worked for her to help control her symptoms when she is having a flare or needs to use it when she has problems with her breathing. This is why she will have to overuse her duoneb solution due to not having a backup rescue method.  Pt is using her Anoro inhaler as prescribed but the duoneb sol and Anoro are all she has and with the weather recently, she has been having problems and needs to be able to have something to use.  Pt was out of the duoneb sol so I have sent one refill to the pharmacy for her so she can have something to try to keep her out of the hospital.  Tried to see if I could get pt scheduled for an appt with either an APP or Dr. Val Eagle but first opening with any of them if we do not use a held slot for HFU or Same Day Acute is not until 6/26.  With Tammy and Dr. Val Eagle both not being available today, routing to [facility] for review. Beth, please advise on this what we need to do for pt and if we might be able to get her added anywhere for a sooner appt to have things discussed.

## 2022-10-28 ENCOUNTER — Other Ambulatory Visit: Payer: Self-pay

## 2022-10-28 MED FILL — Aspirin Tab Delayed Release 81 MG: ORAL | 90 days supply | Qty: 90 | Fill #0 | Status: AC

## 2022-10-29 ENCOUNTER — Other Ambulatory Visit: Payer: Self-pay

## 2022-11-01 ENCOUNTER — Ambulatory Visit: Payer: Medicaid Other | Attending: Cardiology

## 2022-11-01 ENCOUNTER — Other Ambulatory Visit: Payer: Self-pay | Admitting: Internal Medicine

## 2022-11-01 ENCOUNTER — Other Ambulatory Visit: Payer: Self-pay | Admitting: Pharmacist

## 2022-11-01 ENCOUNTER — Other Ambulatory Visit: Payer: Self-pay

## 2022-11-01 DIAGNOSIS — I5022 Chronic systolic (congestive) heart failure: Secondary | ICD-10-CM | POA: Diagnosis not present

## 2022-11-01 DIAGNOSIS — Z95810 Presence of automatic (implantable) cardiac defibrillator: Secondary | ICD-10-CM | POA: Diagnosis not present

## 2022-11-01 DIAGNOSIS — E1165 Type 2 diabetes mellitus with hyperglycemia: Secondary | ICD-10-CM

## 2022-11-01 MED ORDER — OZEMPIC (2 MG/DOSE) 8 MG/3ML ~~LOC~~ SOPN
2.0000 mg | PEN_INJECTOR | SUBCUTANEOUS | 1 refills | Status: DC
Start: 2022-11-01 — End: 2023-01-18
  Filled 2022-11-01: qty 3, 28d supply, fill #0
  Filled 2022-11-22 – 2022-11-25 (×2): qty 3, 28d supply, fill #1
  Filled 2022-12-17 – 2022-12-21 (×2): qty 3, 28d supply, fill #2
  Filled 2023-01-17 – 2023-01-18 (×3): qty 3, 28d supply, fill #3

## 2022-11-02 NOTE — Telephone Encounter (Signed)
Requested Prescriptions  Refused Prescriptions Disp Refills   OZEMPIC, 2 MG/DOSE, 8 MG/3ML SOPN [Pharmacy Med Name: OZEMPIC (2 MG/DOSE) 8 MG/3ML SUBCUTANEOUS SOLUTION PEN-INJECTOR] 3 mL     Sig: INJECT 2 MG INTO THE SKIN ONCE A WEEK.     Endocrinology:  Diabetes - GLP-1 Receptor Agonists - semaglutide Failed - 11/01/2022 10:25 AM      Failed - Cr in normal range and within 360 days    Creatinine  Date Value Ref Range Status  12/20/2019 2.34 (H) 0.44 - 1.00 mg/dL Final   Creat  Date Value Ref Range Status  07/08/2015 0.89 0.50 - 1.05 mg/dL Final   Creatinine, Ser  Date Value Ref Range Status  09/27/2022 1.59 (H) 0.44 - 1.00 mg/dL Final   Creatinine, POC  Date Value Ref Range Status  10/06/2016 300 mg/dL Final   Creatinine,U  Date Value Ref Range Status  02/17/2021 56.2 mg/dL Final   Creatinine, Urine  Date Value Ref Range Status  05/29/2018 282.47 mg/dL Final    Comment:    Performed at [facility], [address], [place], [place] [address]         Passed - HBA1C in normal range and within 180 days    HbA1c, POC (prediabetic range)  Date Value Ref Range Status  01/23/2018 8 (A) 5.7 - 6.4 % Final   HbA1c, POC (controlled diabetic range)  Date Value Ref Range Status  08/03/2022 6.6 0.0 - 7.0 % Final         Passed - Valid encounter within last 6 months    Recent Outpatient Visits           4 weeks ago Pap smear for cervical cancer screening   [facility] Jonah Blue B, MD   3 months ago Type 2 diabetes mellitus with diabetic polyneuropathy, with long-term current use of insulin [facility])   [facility] Marcine Matar, MD   10 months ago Establishing care with new doctor, encounter for   [facility] Jonah Blue B, MD   2 years ago Type 2 diabetes mellitus with diabetic polyneuropathy, with long-term current use of insulin [facility])   [facility] Jonah Blue B, MD   2 years ago Chronic systolic CHF (congestive heart failure) [facility])   [facility] Marcine Matar, MD       Future Appointments             In 1 month Laural Benes, Binnie Rail, MD [facility]

## 2022-11-03 NOTE — Progress Notes (Signed)
EPIC Encounter for ICM Monitoring  Patient Name: Tamara Silva is a 62 y.o. female Date: 11/03/2022 Primary Care Physican: Marcine Matar, MD Primary Cardiologist: Shirlee Latch Electrophysiologist: Elberta Fortis 07/30/2022 Office Weight: 318 lbs   Since 19-Oct-2022 Time in AF    0.0 hr/day (0.0%)                                                            Spoke with patient and heart failure questions reviewed.  Transmission results reviewed.  Pt asymptomatic for fluid accumulation.  Reports feeling well at this time and voices no complaints.     Optivol thoracic impedance suggesting intermittent days with possible fluid accumulation within the last month.          Prescribed:  Torsemide 100 mg take 1 tablet (100 mg total) by mouth every morning and 0.5 tablet (50 mg total) every evening.  Pt gets cramping with 2nd Torsemide dosage. Spironolactone 25 mg take 1 tablet daily Jardiance 10 mg take 1 tablet daily before breakfast   Labs: 09/27/2022 Creatinine 1.59, BUN 27, Potassium 3.8, Sodium 140, GFR 37 09/03/2022 Creatinine 1.49, BUN 33, Potassium 4.2, Sodium 143 08/02/2022 Creatinine 1.58, BUN 30, Potassium 4.3, Sodium 144, GFR 37 05/28/2022 Creatinine 2.03, BUN 33, Potassium 3.8, Sodium 141, GFR 27 A complete set of results can be found in Results Review.   Recommendations:  No changes and encouraged to call if experiencing any fluid symptoms.   Follow-up plan: ICM clinic phone appointment on 12/06/2022.   91 day device clinic remote transmission 01/18/2023.     EP/Cardiology Office Visits: 12/28/2022 with Dr Shirlee Latch.  Recall 03/02/2023 with Dr Elberta Fortis.   Copy of ICM check sent to Dr. Elberta Fortis.    3 month ICM trend: 11/01/2022.    12-14 Month ICM trend:     Karie Soda, RN 11/03/2022 4:17 PM

## 2022-11-05 ENCOUNTER — Other Ambulatory Visit: Payer: Self-pay

## 2022-11-08 ENCOUNTER — Other Ambulatory Visit: Payer: Self-pay

## 2022-11-08 ENCOUNTER — Other Ambulatory Visit: Payer: Self-pay | Admitting: Internal Medicine

## 2022-11-08 MED ORDER — ACCU-CHEK AVIVA PLUS VI STRP
ORAL_STRIP | 2 refills | Status: AC
  Filled 2022-11-08: qty 100, 33d supply, fill #0
  Filled 2022-12-24 (×2): qty 100, 33d supply, fill #1

## 2022-11-08 MED FILL — Isosorbide Dinitrate-Hydralazine HCl Tab 20-37.5 MG: ORAL | 30 days supply | Qty: 90 | Fill #1 | Status: AC

## 2022-11-09 ENCOUNTER — Telehealth: Payer: Self-pay | Admitting: Internal Medicine

## 2022-11-09 NOTE — Telephone Encounter (Signed)
Copied from CRM [id number]. Topic: General - Other >> Nov 09, 2022  2:33 PM Tamara Silva wrote: Reason for CRM: Pt called to report that her [employer] will be disconnected tomorrow if her PCP does not submit the necessary information by today. She says she is on machines to stay alive, that need to be powered.   Says she needs the forms submitted today

## 2022-11-11 ENCOUNTER — Other Ambulatory Visit: Payer: Self-pay

## 2022-11-11 NOTE — Telephone Encounter (Signed)
Called & spoke to the patient. Verified name & DOB. Informed that the form will need to be dropped off due to not being in office. Patient stated verbal understanding and informed that power has not been shut off as of yet and will come in office to have forms completed. No further questions at this time.

## 2022-11-11 NOTE — Progress Notes (Signed)
Remote ICD transmission.   

## 2022-11-12 ENCOUNTER — Other Ambulatory Visit: Payer: Self-pay

## 2022-11-16 ENCOUNTER — Other Ambulatory Visit: Payer: Self-pay

## 2022-11-18 ENCOUNTER — Other Ambulatory Visit: Payer: Self-pay | Admitting: Pulmonary Disease

## 2022-11-18 ENCOUNTER — Telehealth: Payer: Self-pay

## 2022-11-18 ENCOUNTER — Other Ambulatory Visit: Payer: Self-pay | Admitting: Internal Medicine

## 2022-11-18 ENCOUNTER — Other Ambulatory Visit (HOSPITAL_COMMUNITY): Payer: Self-pay

## 2022-11-18 ENCOUNTER — Other Ambulatory Visit (HOSPITAL_COMMUNITY): Payer: Self-pay | Admitting: Cardiology

## 2022-11-18 ENCOUNTER — Other Ambulatory Visit: Payer: Self-pay

## 2022-11-18 DIAGNOSIS — J449 Chronic obstructive pulmonary disease, unspecified: Secondary | ICD-10-CM

## 2022-11-18 MED ORDER — IPRATROPIUM-ALBUTEROL 0.5-2.5 (3) MG/3ML IN SOLN
3.0000 mL | Freq: Four times a day (QID) | RESPIRATORY_TRACT | 0 refills | Status: DC | PRN
Start: 2022-11-18 — End: 2022-11-25
  Filled 2022-11-18: qty 360, 30d supply, fill #0
  Filled 2022-11-18: qty 90, 8d supply, fill #0
  Filled 2022-11-22: qty 90, 8d supply, fill #1
  Filled 2022-11-23 (×2): qty 270, 23d supply, fill #1

## 2022-11-18 MED ORDER — ENTRESTO 49-51 MG PO TABS
1.0000 | ORAL_TABLET | Freq: Two times a day (BID) | ORAL | 1 refills | Status: DC
  Filled 2022-11-18: qty 180, 90d supply, fill #0
  Filled 2023-02-17: qty 180, 90d supply, fill #1

## 2022-11-18 NOTE — Telephone Encounter (Signed)
Copied from CRM [id number]. Topic: General - Inquiry >> Nov 18, 2022 10:41 AM Pincus Sanes wrote: Reason for CRM: Pls fu with pt as she says she has been calling re getting a wheelchair and receives no response? Not being able to validate at this time, needs a fu as she is in dire need she states. She says her weight is 300 lbs for weight limit. FU at [phone]

## 2022-11-19 NOTE — Telephone Encounter (Signed)
Please schedule visit with  PCP as she will need office notes to accompany wheelchair order per insurance guidelines

## 2022-11-22 ENCOUNTER — Other Ambulatory Visit: Payer: Self-pay

## 2022-11-22 NOTE — Telephone Encounter (Signed)
Called & spoke to the patient. Verified name & DOB. Informed patient of office notes needed to accompany wheelchair order per insurance guidelines. Patient expressed verbal understanding and will address at next appointment. Patient is also requesting for a shower chair and toilet chair.

## 2022-11-23 ENCOUNTER — Other Ambulatory Visit: Payer: Self-pay | Admitting: Adult Health

## 2022-11-23 ENCOUNTER — Other Ambulatory Visit: Payer: Self-pay

## 2022-11-23 DIAGNOSIS — J449 Chronic obstructive pulmonary disease, unspecified: Secondary | ICD-10-CM

## 2022-11-24 ENCOUNTER — Other Ambulatory Visit: Payer: Self-pay

## 2022-11-24 NOTE — Telephone Encounter (Signed)
Called & spoke to the patient. Verified name & DOB. Patient clarified that she is requesting a mobility electric chair. Informed patient that a referral to physical therapy will be sent in order for a mobility chair evaluation to be done. Patient expressed verbal understanding. Informed patient to keep next appointment to address need for a shower chair and "toilet chair". Confirmed next appointment with patient. No further questions at this time.

## 2022-11-25 ENCOUNTER — Other Ambulatory Visit: Payer: Self-pay | Admitting: Internal Medicine

## 2022-11-25 ENCOUNTER — Other Ambulatory Visit: Payer: Self-pay

## 2022-11-25 DIAGNOSIS — J449 Chronic obstructive pulmonary disease, unspecified: Secondary | ICD-10-CM

## 2022-11-25 MED ORDER — IPRATROPIUM-ALBUTEROL 0.5-2.5 (3) MG/3ML IN SOLN
3.0000 mL | Freq: Four times a day (QID) | RESPIRATORY_TRACT | 0 refills | Status: DC | PRN
Start: 2022-11-25 — End: 2022-12-30
  Filled 2022-11-25 – 2022-12-07 (×2): qty 360, 30d supply, fill #0
  Filled 2022-12-07: qty 90, 8d supply, fill #0
  Filled 2022-12-13: qty 270, 23d supply, fill #1
  Filled 2022-12-14 (×2): qty 90, 8d supply, fill #1
  Filled 2022-12-17: qty 180, 15d supply, fill #2

## 2022-12-06 ENCOUNTER — Ambulatory Visit: Payer: Medicaid Other | Attending: Cardiology

## 2022-12-06 DIAGNOSIS — Z95810 Presence of automatic (implantable) cardiac defibrillator: Secondary | ICD-10-CM

## 2022-12-06 DIAGNOSIS — I5022 Chronic systolic (congestive) heart failure: Secondary | ICD-10-CM

## 2022-12-06 NOTE — Progress Notes (Unsigned)
EPIC Encounter for ICM Monitoring  Patient Name: Tamara Silva is a 62 y.o. female Date: 12/06/2022 Primary Care Physican: Marcine Matar, MD Primary Cardiologist: Shirlee Latch Electrophysiologist: Elberta Fortis 07/30/2022 Office Weight: 318 lbs 12/07/2022 Weight: 315 lbs   Since 19-Oct-2022 Time in AF    0.0 hr/day (0.0%)                                                            Spoke with patient and heart failure questions reviewed.  Transmission results reviewed.  Pt reports she can tell she has a little fluid but she is unable to take prescribed Torsemide due to severe cramping.     Optivol thoracic impedance suggesting possible fluid accumulation starting 6/24.          Prescribed:  Torsemide 100 mg take 1 tablet (100 mg total) by mouth every morning and 0.5 tablet (50 mg total) every evening.  Pt gets cramping with 2nd Torsemide dosage. Spironolactone 25 mg take 1 tablet daily Jardiance 10 mg take 1 tablet daily before breakfast   Labs: 09/27/2022 Creatinine 1.59, BUN 27, Potassium 3.8, Sodium 140, GFR 37 09/03/2022 Creatinine 1.49, BUN 33, Potassium 4.2, Sodium 143 08/02/2022 Creatinine 1.58, BUN 30, Potassium 4.3, Sodium 144, GFR 37 05/28/2022 Creatinine 2.03, BUN 33, Potassium 3.8, Sodium 141, GFR 27 A complete set of results can be found in Results Review.   Recommendations:  She took increased dose of Torsemide as prescribed and symptoms have resolved.    Follow-up plan: ICM clinic phone appointment on 01/10/2023.   91 day device clinic remote transmission 01/18/2023.     EP/Cardiology Office Visits: 12/28/2022 with Dr Shirlee Latch.  Recall 03/02/2023 with Dr Elberta Fortis.   Copy of ICM check sent to Dr. Elberta Fortis.     3 month ICM trend: 12/06/2022.    12-14 Month ICM trend:     Karie Soda, RN 12/06/2022 11:04 AM

## 2022-12-07 ENCOUNTER — Ambulatory Visit: Payer: Medicaid Other | Admitting: Internal Medicine

## 2022-12-07 ENCOUNTER — Other Ambulatory Visit: Payer: Self-pay

## 2022-12-07 MED FILL — Isosorbide Dinitrate-Hydralazine HCl Tab 20-37.5 MG: ORAL | 30 days supply | Qty: 90 | Fill #2 | Status: AC

## 2022-12-13 ENCOUNTER — Other Ambulatory Visit: Payer: Self-pay

## 2022-12-14 ENCOUNTER — Other Ambulatory Visit: Payer: Self-pay

## 2022-12-14 ENCOUNTER — Other Ambulatory Visit (HOSPITAL_COMMUNITY): Payer: Self-pay

## 2022-12-17 ENCOUNTER — Other Ambulatory Visit: Payer: Self-pay

## 2022-12-17 ENCOUNTER — Other Ambulatory Visit: Payer: Self-pay | Admitting: Student

## 2022-12-17 MED ORDER — APIXABAN 5 MG PO TABS
5.0000 mg | ORAL_TABLET | Freq: Two times a day (BID) | ORAL | 3 refills | Status: DC
  Filled 2022-12-17: qty 60, 30d supply, fill #0
  Filled 2023-02-01: qty 60, 30d supply, fill #1
  Filled 2023-03-01: qty 60, 30d supply, fill #2

## 2022-12-20 ENCOUNTER — Other Ambulatory Visit: Payer: Self-pay

## 2022-12-24 ENCOUNTER — Other Ambulatory Visit: Payer: Self-pay

## 2022-12-28 ENCOUNTER — Other Ambulatory Visit: Payer: Self-pay

## 2022-12-28 ENCOUNTER — Ambulatory Visit (HOSPITAL_COMMUNITY)
Admission: RE | Admit: 2022-12-28 | Discharge: 2022-12-28 | Disposition: A | Discharge status: Home / Self Care | Payer: Medicaid Other | Source: Ambulatory Visit | Attending: Internal Medicine | Admitting: Internal Medicine

## 2022-12-28 ENCOUNTER — Encounter (HOSPITAL_COMMUNITY): Payer: Self-pay | Admitting: Cardiology

## 2022-12-28 ENCOUNTER — Ambulatory Visit (HOSPITAL_BASED_OUTPATIENT_CLINIC_OR_DEPARTMENT_OTHER)
Admission: RE | Admit: 2022-12-28 | Discharge: 2022-12-28 | Disposition: A | Discharge status: Home / Self Care | Payer: Medicaid Other | Source: Ambulatory Visit | Attending: Cardiology | Admitting: Cardiology

## 2022-12-28 VITALS — HR 75

## 2022-12-28 DIAGNOSIS — I5022 Chronic systolic (congestive) heart failure: Secondary | ICD-10-CM | POA: Diagnosis present

## 2022-12-28 DIAGNOSIS — Z9981 Dependence on supplemental oxygen: Secondary | ICD-10-CM | POA: Diagnosis not present

## 2022-12-28 DIAGNOSIS — F17210 Nicotine dependence, cigarettes, uncomplicated: Secondary | ICD-10-CM | POA: Diagnosis not present

## 2022-12-28 DIAGNOSIS — I252 Old myocardial infarction: Secondary | ICD-10-CM | POA: Insufficient documentation

## 2022-12-28 DIAGNOSIS — D649 Anemia, unspecified: Secondary | ICD-10-CM | POA: Insufficient documentation

## 2022-12-28 DIAGNOSIS — I2510 Atherosclerotic heart disease of native coronary artery without angina pectoris: Secondary | ICD-10-CM | POA: Diagnosis not present

## 2022-12-28 DIAGNOSIS — T501X6A Underdosing of loop [high-ceiling] diuretics, initial encounter: Secondary | ICD-10-CM | POA: Diagnosis not present

## 2022-12-28 DIAGNOSIS — N183 Chronic kidney disease, stage 3 unspecified: Secondary | ICD-10-CM | POA: Insufficient documentation

## 2022-12-28 DIAGNOSIS — Z86711 Personal history of pulmonary embolism: Secondary | ICD-10-CM | POA: Insufficient documentation

## 2022-12-28 DIAGNOSIS — Z91128 Patient's intentional underdosing of medication regimen for other reason: Secondary | ICD-10-CM | POA: Diagnosis not present

## 2022-12-28 DIAGNOSIS — E669 Obesity, unspecified: Secondary | ICD-10-CM | POA: Diagnosis not present

## 2022-12-28 DIAGNOSIS — I130 Hypertensive heart and chronic kidney disease with heart failure and stage 1 through stage 4 chronic kidney disease, or unspecified chronic kidney disease: Secondary | ICD-10-CM | POA: Diagnosis not present

## 2022-12-28 DIAGNOSIS — I272 Pulmonary hypertension, unspecified: Secondary | ICD-10-CM | POA: Diagnosis not present

## 2022-12-28 DIAGNOSIS — I4891 Unspecified atrial fibrillation: Secondary | ICD-10-CM | POA: Diagnosis not present

## 2022-12-28 DIAGNOSIS — Z7901 Long term (current) use of anticoagulants: Secondary | ICD-10-CM | POA: Diagnosis not present

## 2022-12-28 DIAGNOSIS — J449 Chronic obstructive pulmonary disease, unspecified: Secondary | ICD-10-CM | POA: Insufficient documentation

## 2022-12-28 DIAGNOSIS — Z8673 Personal history of transient ischemic attack (TIA), and cerebral infarction without residual deficits: Secondary | ICD-10-CM | POA: Insufficient documentation

## 2022-12-28 DIAGNOSIS — Z8674 Personal history of sudden cardiac arrest: Secondary | ICD-10-CM | POA: Insufficient documentation

## 2022-12-28 DIAGNOSIS — E1122 Type 2 diabetes mellitus with diabetic chronic kidney disease: Secondary | ICD-10-CM | POA: Insufficient documentation

## 2022-12-28 DIAGNOSIS — R9431 Abnormal electrocardiogram [ECG] [EKG]: Secondary | ICD-10-CM | POA: Diagnosis not present

## 2022-12-28 DIAGNOSIS — I110 Hypertensive heart disease with heart failure: Secondary | ICD-10-CM | POA: Diagnosis present

## 2022-12-28 LAB — CBC
HCT: 30.3 % — ABNORMAL LOW (ref 36.0–46.0)
Hemoglobin: 8.6 g/dL — ABNORMAL LOW (ref 12.0–15.0)
MCH: 23.3 pg — ABNORMAL LOW (ref 26.0–34.0)
MCHC: 28.4 g/dL — ABNORMAL LOW (ref 30.0–36.0)
MCV: 82.1 fL (ref 80.0–100.0)
Platelets: 365 10*3/uL (ref 150–400)
RBC: 3.69 MIL/uL — ABNORMAL LOW (ref 3.87–5.11)
RDW: 18.2 % — ABNORMAL HIGH (ref 11.5–15.5)
WBC: 8.1 10*3/uL (ref 4.0–10.5)
nRBC: 0 % (ref 0.0–0.2)

## 2022-12-28 LAB — ECHOCARDIOGRAM COMPLETE
Area-P 1/2: 3.58 cm2
Calc EF: 48.5 %
MV M vel: 3.92 m/s
MV Peak grad: 61.5 mmHg
S' Lateral: 4.5 cm
Single Plane A2C EF: 53.9 %
Single Plane A4C EF: 41.9 %

## 2022-12-28 LAB — MAGNESIUM: Magnesium: 2.1 mg/dL (ref 1.7–2.4)

## 2022-12-28 LAB — BASIC METABOLIC PANEL
Anion gap: 9 (ref 5–15)
BUN: 17 mg/dL (ref 8–23)
CO2: 29 mmol/L (ref 22–32)
Calcium: 9 mg/dL (ref 8.9–10.3)
Chloride: 105 mmol/L (ref 98–111)
Creatinine, Ser: 1.18 mg/dL — ABNORMAL HIGH (ref 0.44–1.00)
GFR, Estimated: 52 mL/min — ABNORMAL LOW (ref >60)
Glucose, Bld: 80 mg/dL (ref 70–99)
Potassium: 3.8 mmol/L (ref 3.5–5.1)
Sodium: 143 mmol/L (ref 135–145)

## 2022-12-28 LAB — IRON AND TIBC
Iron: 18 ug/dL — ABNORMAL LOW (ref 28–170)
Saturation Ratios: 5 % — ABNORMAL LOW (ref 10.4–31.8)
TIBC: 357 ug/dL (ref 250–450)
UIBC: 339 ug/dL

## 2022-12-28 LAB — LIPID PANEL
Cholesterol: 159 mg/dL (ref 0–200)
HDL: 63 mg/dL (ref >40)
LDL Cholesterol: 88 mg/dL (ref 0–99)
Total CHOL/HDL Ratio: 2.5 RATIO
Triglycerides: 42 mg/dL (ref <150)
VLDL: 8 mg/dL (ref 0–40)

## 2022-12-28 LAB — BRAIN NATRIURETIC PEPTIDE: B Natriuretic Peptide: 245.3 pg/mL — ABNORMAL HIGH (ref 0.0–100.0)

## 2022-12-28 LAB — FERRITIN: Ferritin: 16 ng/mL (ref 11–307)

## 2022-12-28 MED ORDER — POTASSIUM CHLORIDE CRYS ER 20 MEQ PO TBCR
20.0000 meq | EXTENDED_RELEASE_TABLET | Freq: Every day | ORAL | 3 refills | Status: DC
  Filled 2022-12-28: qty 90, 90d supply, fill #0

## 2022-12-28 MED ORDER — TORSEMIDE 100 MG PO TABS
ORAL_TABLET | ORAL | 3 refills | Status: DC
  Filled 2022-12-28: qty 135, 90d supply, fill #0

## 2022-12-28 MED ORDER — SPIRONOLACTONE 25 MG PO TABS
12.5000 mg | ORAL_TABLET | Freq: Every day | ORAL | 3 refills | Status: DC
  Filled 2022-12-28: qty 45, 90d supply, fill #0
  Filled 2023-03-01 – 2023-03-08 (×3): qty 45, 90d supply, fill #1

## 2022-12-28 NOTE — Progress Notes (Signed)
  Patient requested ride home from clinic appointment today. CSW made arrangements with taxi. Lasandra Beech, LCSW, CCSW-MCS [phone]     12/28/2022  Rumer Wuthrich DOB: 05-May-1961 MRN: [id number]   RIDER WAIVER AND RELEASE OF LIABILITY  For the purposes of helping with transportation needs, [facility] partners with outside transportation providers (taxi companies, [place], Catering manager.) to give [name] patients or other approved people the choice of on-demand rides [name]") to our buildings for non-emergency visits.  By using [employer], I, the person signing this document, on behalf of myself and/or any legal minors (in my care using the [employer]), agree:  Science writer given to me are supplied by independent, outside transportation providers who do not work for, or have any affiliation with, [name]. [place] is not a transportation company. [place] has no control over the quality or safety of the rides I get using [employer]. [place] has no control over whether any outside ride will happen on time or not. [place] gives no guarantee on the reliability, quality, safety, or availability on any rides, or that no mistakes will happen. I know and accept that traveling by vehicle (car, truck, SVU, Zenaida Niece, bus, taxi, etc.) has risks of serious injuries such as disability, being paralyzed, and death. I know and agree the risk of using [employer] is mine alone, and not [employer]. Transport Services are provided "as is" and as are available. The transportation providers are in charge for all inspections and care of the vehicles used to provide these rides. I agree not to take legal action against [place], its agents, employees, officers, directors, representatives, insurers, attorneys, assigns, successors, subsidiaries, and affiliates at any time for any reasons related directly or indirectly to using [employer]. I also  agree not to take legal action against  or its affiliates for any injury, death, or damage to property caused by or related to using [employer]. I have read this Waiver and Release of Liability, and I understand the terms used in it and their legal meaning. This Waiver is freely and voluntarily given with the understanding that my right (or any legal minors) to legal action against  relating to [employer] is knowingly given up to use these services.   I attest that I read the Ride Waiver and Release of Liability to [name], gave Ms. Chojnacki the opportunity to ask questions and answered the questions asked (if any). I affirm that [name] then provided consent for assistance with transportation.     Laurey Morale MD

## 2022-12-28 NOTE — Progress Notes (Signed)
  Echocardiogram 2D Echocardiogram has been performed.  Tamara Silva 12/28/2022, 10:03 AM

## 2022-12-28 NOTE — Patient Instructions (Addendum)
INCREASE Torsemide to 100 mg in the morning and 50 mg in the evening.  START spironolactone 12.5 mg  (1/2 tab) daily.  STOP Asprin.  START Potasium 1 tablet daily.  Labs done today, your results will be available in MyChart, we will contact you for abnormal readings.  Repeat blood work in 10 days.  Your physician recommends that you schedule a follow-up appointment in: 3 weeks  If you have any questions or concerns before your next appointment please send [place] a message through [place] or call our office at [phone].    TO LEAVE A MESSAGE FOR THE NURSE SELECT OPTION 2, PLEASE LEAVE A MESSAGE INCLUDING: YOUR NAME DATE OF BIRTH CALL BACK NUMBER REASON FOR CALL**this is important as we prioritize the call backs  YOU WILL RECEIVE A CALL BACK THE SAME DAY AS LONG AS YOU CALL BEFORE 4:00 PM  At [facility], you and your health needs are our priority. As part of our continuing mission to provide you with exceptional heart care, we have created designated Provider Care Teams. These Care Teams include your primary Cardiologist (physician) and Advanced Practice Providers (APPs- Physician Assistants and Nurse Practitioners) who all work together to provide you with the care you need, when you need it.   You may see any of the following providers on your designated Care Team at your next follow up: Dr Arvilla Meres Dr Marca Ancona Dr. Marcos Eke, NP Robbie Lis, [place] [facility] [place], [place] Brynda Peon, NP Karle Plumber, PharmD   Please be sure to bring in all your medications bottles to every appointment.    Thank you for choosing  [facility]

## 2022-12-29 NOTE — Progress Notes (Signed)
IDIzabellah Silva, DOB 08-08-1960, MRN [id number]   Provider location:  Advanced [facility] Failure Type of Visit: Established patient   PCP:  Marcine Matar, MD Endocrinology: Dr. Lucianne Muss  Nephrology: Dr. Malen Gauze HF Cardiology: Dr Shirlee Latch   History of Present Illness: Tamara Silva is a 62 y.o. 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  She has had frequent admits for anemia and CHF with transfusions and diuresis. Prasugrel was stopped. No source of bleeding found on EGD and colonoscopy.   She is on home 02 and nocturnal BiPAP for OHS/OSA and COPD.   She no longer smokes.  Hx of PE in 2014.    Hx of bradycardia with Coreg at higher dose.  Echo 05/31/18 with EF 40-45%, PA peak pressure 71 mmHg.    Admitted 10/25/18 with SOB and edema, + productive cough.  This started over a few weeks prior to admission. Hospital course complicated by PEA arrest on 10/28/18. CPR for 25 minutes. Extubated 10/30/18.  Developed AKI.  HF team followed closely to optimize HF.  She was massively volume overloaded and extensively diuresed, weight down 65 lbs total.  Echo showed EF down to 30-35%.  No coronary angiography due to AKI.   Readmitted  11/17/18 with lower extremity edema and volume overload. Diuresed with IV lasix and transitioned to torsemide 40 mg twice daily. Discharged on 11/21/18 to home on oxygen.   She had RHC/LHC in 9/20, most significant stenosis was 75% ostial D1.  There was mild to moderate nonobstructive disease in other vessels. There was not an interventional target.  Filling pressures were mildly elevated with preserved cardiac output.  Torsemide was increased.   Echo in 10/20 showed that EF remained 30-35% with wall motion abnormalities, RV appeared normal.   Medtronic ICD placed in 11/20.   Echo in 2/23 showed EF 30-35%, dyskinetic apex, severe LV dilation,  normal RV size and systolic function.  Admitted 5/23 with AKI, given IVF fluids. GDMT held. SCr improved and Bidil, torsemide and spironolactone restarted. Remained off Entresto. Bisoprolol stopped due to bradycardia and junctional rhythm. Discharged home, weight 332 lbs.   Echo was done today and reviewed, EF 35-40%, per-apical akinesis, mildly decreased RV systolic function, PASP 55 mmHg, IVC dilated, mild-moderate MR.   Today she returns for HF follow up. She wears 3L home oxygen but still smokes 1-2 cigarettes/day.  She is taking a lower dose of torsemide than prescribed (50 mg bid currently) because she had leg cramping when taking 100 qam/50 qpm.  She is short of breath walking around her house.  She has orthopnea and occasional PND.  No chest pain.  Rare mild lightheadedness with standing. She uses BIPAP at night.   ECG (personally reviewed): NSR, low voltage P's, inferior Qs, poor RWP  Labs (7/20): K 3.7, creatinine 1.56 Labs (9/20): K 3.7, creatinine 1.46, LDL 52, HDL 56  Labs (12/20): K 3.8, creatinine 1.51 Labs (1/21): hgb 10.6, K 4.4, creatinine 1.91 Labs (5/21): LDL 68, HDL 56 Labs (7/21): K 4.4, creatinine 2.34, hgb 9.7 Labs (11/22): K 4.4., creatinine 2.30, hgb 9.3 Labs (1/23): K 4.0, creatinine 2.32 Labs (2/23): LDL 55, TGs 74 Labs (3/23): K 4.4, creatinine 2.33 Labs (4/23): Creatinine 1.88 Labs (5/23): K 4.7, creatinine 1.81, BNP 78.5 Labs (10/23): K 3.7, creatinine 1.6  Labs (4/24):  K 4.2, creatinine 1.49 => 1.59, BNP 117, LDL 97  PMH: 1. CVA (1/20). 2. Type 2 diabetes 3. HTN 4. H/o seizures 5. PEA arrest (5/20) with CPR.  6. COPD: Home oxygen.   7. CAD: PCI [facility] 2006, [facility] 2009.  - Inferior STEMI 5/15, DES to RCA.  - Poor responder to Plavix.  - LHC (9/20): 75% ostial D1, 40-50% mLCx, 40% proximal PLV.  8. Hyperlipidemia 9. PE: 2014, LLL.  10. OHS/OSA: Home oxygen and Bipap used.  11. Chronic systolic CHF: Suspect primarily ischemic  cardiomyopathy. Medtronic ICD.  - Echo (5/15): EF 35-40% - Echo (2/16): EF 40-45% - Echo (1/20): EF 40-45% - Echo (5/20): EF 30-35%, mild RV dilation.  - RHC (5/20): mean RA 9, PA 62/22 mean 37, mean PCWP 15, CI 4.29, PVR 2.1 WU.  - RHC (9/20): mean RA 11, PA 66/18 mean 37, mean PCWP 19, CI 3.12, PVR 2.4 WU - Echo (10/20): EF 30-35% with wall motion abnormalities, normal RV.  - Echo (2/23): EF 30-35%, dyskinetic apex, severe LV dilation, normal RV size and systolic function. - Echo (7/24): EF 35-40%, per-apical akinesis, mildly decreased RV systolic function, PASP 55 mmHg, IVC dilated, mild-moderate MR.  12. CKD: Stage 3.  13. Fe deficiency anemia: EGD and colonoscopy without definite source of bleeding.   ROS: All systems negative except as listed in HPI, PMH and Problem List.  SH:  Social History   Socioeconomic History   Marital status: Single    Spouse name: Not on file   Number of children: 1   Years of education: Not on file   Highest education level: Not on file  Occupational History   Not on file  Tobacco Use   Smoking status: Some Days    Current packs/day: 0.00    Average packs/day: 0.5 packs/day for 20.0 years (10.0 ttl pk-yrs)    Types: Cigarettes    Start date: 05/31/1998    Last attempt to quit: 05/31/2018    Years since quitting: 4.5   Smokeless tobacco: Never   Tobacco comments:    smoked off and on x 20 years.  Still smokes some when stressed.  No sure how often or how much.  07/22/2022  hfb  Vaping Use   Vaping status: Never Used  Substance and Sexual Activity   Alcohol use: No    Alcohol/week: 0.0 standard drinks of alcohol   Drug use: Not Currently    Types: Cocaine   Sexual activity: Yes  Other Topics Concern   Not on file  Social History Narrative   Origibnally from [place]   Most recently from [place]    Daughter lives in town      On Social security to CHF, COPD, CAD   Social Determinants of Health   Financial Resource Strain: Medium Risk  (01/13/2018)   Overall Financial Resource Strain (CARDIA)    Difficulty of Paying Living Expenses: Somewhat hard  Food Insecurity: Food Insecurity Present (01/13/2018)   Hunger Vital Sign    Worried About Running Out of Food in the Last Year: Sometimes true    Ran Out of Food in the Last Year: Sometimes true  Transportation Needs: Unmet Transportation Needs (12/28/2022)   PRAPARE - Administrator, Civil Service (Medical): Yes    Lack of Transportation (Non-Medical): Yes  Physical Activity: Inactive (01/13/2018)   Exercise Vital Sign    Days of Exercise per Week: 0 days    Minutes of Exercise per Session: 0  min  Stress: Stress Concern Present (01/13/2018)   [employer] of Occupational Health - Occupational Stress Questionnaire    Feeling of Stress : Rather much  Social Connections: Unknown (03/17/2022)   Received from [facility]   Social Network    Social Network: Not on file  Intimate Partner Violence: Unknown (03/17/2022)   Received from [facility]   HITS    Physically Hurt: Not on file    Insult or Talk Down To: Not on file    Threaten Physical Harm: Not on file    Scream or Curse: Not on file   FH:  Family History  Problem Relation Age of Onset   Stroke Mother    Hypertension Mother    Colon cancer Father    Diabetes Father    Heart attack Father    Sarcoidosis Other    Breast cancer Paternal Aunt    Emphysema Brother    Lung disease Brother        Unknown type, 3 brothers, one with liver and lung disease   Diabetes Brother    Asthma Paternal Aunt    Diabetes Sister     Current Outpatient Medications  Medication Sig Dispense Refill   acetaminophen (TYLENOL) 500 MG tablet Take 1,000 mg by mouth every 6 (six) hours as needed for moderate pain or headache.     albuterol (PROVENTIL) (2.5 MG/3ML) 0.083% nebulizer solution Take 3 mLs (2.5 mg total) by nebulization every 6 (six) hours as needed for wheezing or shortness of breath. 120 mL 0   apixaban  (ELIQUIS) 5 MG TABS tablet Take 1 tablet (5 mg total) by mouth 2 (two) times daily. 60 tablet 3   ascorbic acid (VITAMIN C) 500 MG tablet TAKE ONE TABLET BY MOUTH ONCE DAILY 90 tablet 3   atorvastatin (LIPITOR) 40 MG tablet Take 1 tablet (40 mg total) by mouth daily. 90 tablet 3   BIDIL 20-37.5 MG tablet TAKE 1 TABLET BY MOUTH 3 (THREE) TIMES DAILY. 270 tablet 3   Blood Glucose Monitoring Suppl (ACCU-CHEK GUIDE) w/Device KIT Use accu chek guide to check blood sugar three times daily. 1 kit 0   cholecalciferol (VITAMIN D3) 25 MCG (1000 UNIT) tablet Take 2 tablets (50 mcg total) by mouth daily for 3 months. 180 tablet 3   Continuous Blood Gluc Sensor (FREESTYLE LIBRE 2 SENSOR) MISC USE ONE SENSOR EVERY 14 (FOURTEEN) DAYS. 2 each 0   Continuous Glucose Sensor (FREESTYLE LIBRE 2 SENSOR) MISC Use one sensor to monitor blood sugars every 14 days 28 days 2 each 11   D3-1000 25 MCG (1000 UT) tablet Take 1,000 Units by mouth 2 (two) times daily.     empagliflozin (JARDIANCE) 10 MG TABS tablet Take 1 tablet (10 mg total) by mouth daily before breakfast. 90 tablet 3   FEROSUL 325 (65 Fe) MG tablet TAKE 1 TABLET (325 MG TOTAL) BY MOUTH DAILY AFTER SUPPER. PLEASE TAKE WITH A SOURCE OF VITAMIN C 90 tablet 3   fluticasone (FLONASE) 50 MCG/ACT nasal spray Place 1 spray into both nostrils daily. 16 g 6   glucose blood (ACCU-CHEK AVIVA PLUS) test strip USE 1 STRIP TO CHECK BLOOD SUGAR 3 TIMES A DAY . 100 each 2   hydrocerin (EUCERIN) CREA Apply 1 Application topically 2 (two) times daily.  0   insulin degludec (TRESIBA FLEXTOUCH) 200 UNIT/ML FlexTouch Pen Inject 80 Units into the skin daily. 9 mL 3   Insulin Pen Needle (EASY COMFORT PEN NEEDLES) 31G X 5 MM  MISC USE 3 TIMES A DAY FOR INSULIN ADMINISTRATION 100 each 12   ipratropium-albuterol (DUONEB) 0.5-2.5 (3) MG/3ML SOLN Take 3 mLs by nebulization every 6 (six) hours as needed. 360 mL 0   NEEDLE, DISP, 26 G 26G X 1/2" MISC 1 application by Does not apply route  daily. 100 each 0   omeprazole (PRILOSEC) 40 MG capsule Take 1 capsule (40 mg total) by mouth daily. 90 capsule 1   polyethylene glycol (MIRALAX / GLYCOLAX) 17 g packet Take 17 g by mouth daily as needed. 14 each 6   potassium chloride SA (KLOR-CON M) 20 MEQ tablet Take 1 tablet (20 mEq total) by mouth daily. 90 tablet 3   RESTASIS 0.05 % ophthalmic emulsion Place 1 drop into both eyes daily as needed (dry eyes).     sacubitril-valsartan (ENTRESTO) 49-51 MG Take 1 tablet by mouth 2 (two) times daily. 180 tablet 1   Semaglutide, 2 MG/DOSE, (OZEMPIC, 2 MG/DOSE,) 8 MG/3ML SOPN Inject 2 mg into the skin once a week. 9 mL 1   senna-docusate (SENOKOT-S) 8.6-50 MG tablet Take 1 tablet by mouth 2 (two) times daily. 30 tablet 0   spironolactone (ALDACTONE) 25 MG tablet Take 0.5 tablets (12.5 mg total) by mouth daily. 45 tablet 3   umeclidinium-vilanterol (ANORO ELLIPTA) 62.5-25 MCG/ACT AEPB Inhale 1 puff into the lungs daily. 60 each 11   vitamin B-12 (CYANOCOBALAMIN) 1000 MCG tablet Take 1,000 mcg by mouth daily.     torsemide (DEMADEX) 100 MG tablet Take 1 tablet (100 mg total) by mouth in the morning AND 0.5 tablets (50 mg total) every evening. 180 tablet 3   No current facility-administered medications for this encounter.   Pulse 75   LMP 05/30/2013   SpO2 98% Comment: 4l n/c  Wt Readings from Last 3 Encounters:  09/27/22 (!) 147 kg (324 lb)  09/03/22 (!) 147.5 kg (325 lb 3.2 oz)  08/03/22 (!) 146.5 kg (323 lb)   Physical Exam:   General: NAD Neck: JVP 8-9 cm with HJR, no thyromegaly or thyroid nodule.  Lungs: Distant BS CV: Nondisplaced PMI.  Heart regular S1/S2, no S3/S4, no murmur.  No peripheral edema.  No carotid bruit.  Normal pedal pulses.  Abdomen: Soft, nontender, no hepatosplenomegaly, no distention.  Skin: Intact without lesions or rashes.  Neurologic: Alert and oriented x 3.  Psych: Normal affect. Extremities: No clubbing or cyanosis.  HEENT: Normal.   Assessment &  Plan: 1. H/O PEA arrest: Due to severe hypoxemia in 5/20 in setting of CHF exacerbation.   2. Chronic systolic CHF: Suspect primarily ischemic cardiomyopathy.  Medtronic ICD.  Echo in 1/20 with EF 40-45%, RV moderately dilated, PASP 71 mmHg.  Echo in 5/20 with lower EF, 30-35%, and mildly dilated RV.  Significant RV failure, may be related to OHS/OSA and COPD. She was massively volume overloaded during 5/20 admission and diuresed extensively.  RHC done 6/20 showed high cardiac output and minimally elevated filling pressures after diuresis.  PVR was not elevated.  RHC was done again in 9/20, filling pressures were elevated with preserved cardiac output.  Echo in 10/20 showed EF still 30-35%.  Echo in 2/23 with EF 30-35% RV normal. Echo today showed EF 35-40%, per-apical akinesis, mildly decreased RV systolic function, PASP 55 mmHg, IVC dilated, mild-moderate MR.  Symptoms have been worse recently, NYHA class IIIb (confounded to a certain extent by lung disease). She is volume overloaded on exam.  - Increase torsemide back to 100 mg /50 qpm.  Add KCl 20 daily.  BMET, magnesium and BNP today, BMET 10 days.  - Continue Jardiance 10 mg daily.  - Continue Bidil 1 tab tid.   - Continue Entresto 49/51 bid.  - Restart spironolactone 12.5 mg daily.  - Off bisoprolol with bradycardia and junctional rhythm. Will not add back today. 3. CKD: Stage III: Continue SGLT2i. BMET today. 4. COPD: On home oxygen at baseline.   - Continues to smoke a few cigarettes, urged her to quit.  5. OHS/OSA: On home oxygen at all times and wears Bipap at night. She is followed by Pulmonary. 6. CAD: History of PCI, most recently had inferior STEMI in 5/15 with DES to RCA.  Coronary angiography was done in 9/20, there was 75% ostial D1 stenosis but no good target for PCI. No chest pain.   - Continue atorvastatin 40 mg daily. Check lipids today. - Given stable CAD and apixaban use, can stop ASA.  7. Remote PE: She is on Eliquis for AF.    8. Anemia: History of GI bleeding, had endoscopies earlier in 2021 with no bleeding source found.  Also with history of B12 deficiency. She has seen Dr. Leonides Schanz for this. - I will arrange for Fe studies to see if she would benefit from IV Fe.  9. Pulmonary hypertension: Pulmonary venous hypertension on last RHC in 9/20.  10. Obesity: She has been on semaglutide.   11. Atrial fibrillation: No recent episodes on device interrogation. NSR on ECG today. - Continue Eliquis. CBC today.   Follow up in 3 wks with APP to reassess volume.   Marca Ancona  12/29/2022

## 2022-12-30 ENCOUNTER — Other Ambulatory Visit: Payer: Self-pay | Admitting: Internal Medicine

## 2022-12-30 ENCOUNTER — Other Ambulatory Visit: Payer: Self-pay

## 2022-12-30 ENCOUNTER — Other Ambulatory Visit (HOSPITAL_COMMUNITY): Payer: Self-pay

## 2022-12-30 DIAGNOSIS — I5022 Chronic systolic (congestive) heart failure: Secondary | ICD-10-CM

## 2022-12-30 DIAGNOSIS — J449 Chronic obstructive pulmonary disease, unspecified: Secondary | ICD-10-CM

## 2022-12-30 MED ORDER — IPRATROPIUM-ALBUTEROL 0.5-2.5 (3) MG/3ML IN SOLN
3.0000 mL | Freq: Four times a day (QID) | RESPIRATORY_TRACT | 0 refills | Status: DC | PRN
  Filled 2022-12-30: qty 180, 15d supply, fill #0

## 2022-12-31 ENCOUNTER — Other Ambulatory Visit: Payer: Self-pay

## 2023-01-03 ENCOUNTER — Telehealth (HOSPITAL_COMMUNITY): Payer: Self-pay

## 2023-01-03 ENCOUNTER — Other Ambulatory Visit: Payer: Self-pay

## 2023-01-03 DIAGNOSIS — E785 Hyperlipidemia, unspecified: Secondary | ICD-10-CM

## 2023-01-03 DIAGNOSIS — I5022 Chronic systolic (congestive) heart failure: Secondary | ICD-10-CM

## 2023-01-03 DIAGNOSIS — I2510 Atherosclerotic heart disease of native coronary artery without angina pectoris: Secondary | ICD-10-CM

## 2023-01-03 MED ORDER — ATORVASTATIN CALCIUM 80 MG PO TABS
80.0000 mg | ORAL_TABLET | Freq: Every day | ORAL | 3 refills | Status: DC
Start: 2023-01-03 — End: 2023-04-08

## 2023-01-03 NOTE — Telephone Encounter (Signed)
Patient advised and verbalized understanding,lab and iron infusion appointment scheduled,lab orders entered. Med list updated to reflect changes.   Orders Placed This Encounter  Procedures   Lipid Profile    Standing Status:   Future    Standing Expiration Date:   01/03/2024    Order Specific Question:   Release to patient    Answer:   Immediate [1]   Hepatic function panel    Standing Status:   Future    Standing Expiration Date:   01/03/2024    Order Specific Question:   Release to patient    Answer:   Immediate [1]   Meds ordered this encounter  Medications   atorvastatin (LIPITOR) 80 MG tablet    Sig: Take 1 tablet (80 mg total) by mouth daily.    Dispense:  90 tablet    Refill:  3    Please cancel all previous orders for current medication. Change in dosage or pill size.   +

## 2023-01-03 NOTE — Telephone Encounter (Signed)
-----   Message from Nurse Emer C sent at 12/30/2022  3:36 PM EDT ----- Orders for infusion are in ----- Message ----- From: Laurey Morale, MD Sent: 12/29/2022   7:29 AM EDT To: [facility] Triage Pool  1. Fe deficiency anemia, arrange for IV iron. 2. Goal LDL < 55, increase atorvastatin to 80 mg daily with lipids/LFTs in 2 months.

## 2023-01-06 ENCOUNTER — Other Ambulatory Visit: Payer: Self-pay

## 2023-01-06 ENCOUNTER — Telehealth (HOSPITAL_COMMUNITY): Payer: Self-pay | Admitting: Licensed Clinical Social Worker

## 2023-01-06 MED FILL — Isosorbide Dinitrate-Hydralazine HCl Tab 20-37.5 MG: ORAL | 30 days supply | Qty: 90 | Fill #3 | Status: AC

## 2023-01-06 NOTE — Telephone Encounter (Signed)
H&V Care Navigation CSW Progress Note  Clinical Social Worker  received call from patient requesting assistance  for transportation to lab appointment tomorrow.  Patient is participating in a Managed Medicaid Plan:  Yes  CSW made taxi arrangement for patient to come to clinic for lab appointment. Waiver reviewed. Lasandra Beech, LCSW, CCSW-MCS [phone]   SDOH Screenings   Food Insecurity: Food Insecurity Present (01/13/2018)  Transportation Needs: Unmet Transportation Needs (01/06/2023)  Depression (PHQ2-9): Medium Risk (08/03/2022)  Financial Resource Strain: Medium Risk (01/13/2018)  Physical Activity: Inactive (01/13/2018)  Social Connections: Unknown (03/17/2022)   Received from [facility]  Stress: Stress Concern Present (01/13/2018)  Tobacco Use: High Risk (12/28/2022)   01/06/2023  Tamara Silva DOB: 07-Jul-1960 MRN: [id number]   RIDER WAIVER AND RELEASE OF LIABILITY  For the purposes of helping with transportation needs, [facility] partners with outside transportation providers (taxi companies, [place], Catering manager.) to give [name] patients or other approved people the choice of on-demand rides [name]") to our buildings for non-emergency visits.  By using [employer], I, the person signing this document, on behalf of myself and/or any legal minors (in my care using the [employer]), agree:  Science writer given to me are supplied by independent, outside transportation providers who do not work for, or have any affiliation with, [name]. [place] is not a transportation company. [facility] has no control over the quality or safety of the rides I get using [employer]. [facility] has no control over whether any outside ride will happen on time or not. [facility] gives no guarantee on the reliability, quality, safety, or availability on any rides, or that no mistakes will happen. I know and accept that traveling by vehicle (car, truck,  [employer], Zenaida Niece, bus, taxi, etc.) has risks of serious injuries such as disability, being paralyzed, and death. I know and agree the risk of using [employer] is mine alone, and not [employer]. Transport Services are provided "as is" and as are available. The transportation providers are in charge for all inspections and care of the vehicles used to provide these rides. I agree not to take legal action against [facility], its agents, employees, officers, directors, representatives, insurers, attorneys, assigns, successors, subsidiaries, and affiliates at any time for any reasons related directly or indirectly to using [employer]. I also agree not to take legal action against [name] or its affiliates for any injury, death, or damage to property caused by or related to using [employer]. I have read this Waiver and Release of Liability, and I understand the terms used in it and their legal meaning. This Waiver is freely and voluntarily given with the understanding that my right (or any legal minors) to legal action against [facility] relating to [employer] is knowingly given up to use these services.   I attest that I read the Ride Waiver and Release of Liability to Tamara Silva, gave Ms. Burgert the opportunity to ask questions and answered the questions asked (if any). I affirm that Tamara Silva then provided consent for assistance with transportation.     Marcy Siren

## 2023-01-07 ENCOUNTER — Other Ambulatory Visit: Payer: Self-pay

## 2023-01-07 ENCOUNTER — Other Ambulatory Visit (HOSPITAL_COMMUNITY): Payer: Medicaid Other

## 2023-01-10 ENCOUNTER — Ambulatory Visit: Payer: Medicaid Other | Attending: Cardiology

## 2023-01-10 DIAGNOSIS — I5022 Chronic systolic (congestive) heart failure: Secondary | ICD-10-CM | POA: Diagnosis not present

## 2023-01-10 DIAGNOSIS — Z95810 Presence of automatic (implantable) cardiac defibrillator: Secondary | ICD-10-CM

## 2023-01-13 ENCOUNTER — Other Ambulatory Visit: Payer: Self-pay

## 2023-01-13 ENCOUNTER — Ambulatory Visit (HOSPITAL_COMMUNITY)
Admission: RE | Admit: 2023-01-13 | Discharge: 2023-01-13 | Disposition: A | Discharge status: Home / Self Care | Payer: Medicaid Other | Source: Ambulatory Visit | Attending: Cardiology | Admitting: Cardiology

## 2023-01-13 DIAGNOSIS — I5022 Chronic systolic (congestive) heart failure: Secondary | ICD-10-CM | POA: Diagnosis present

## 2023-01-13 MED ORDER — OZEMPIC (2 MG/DOSE) 8 MG/3ML ~~LOC~~ SOPN
2.0000 mg | PEN_INJECTOR | SUBCUTANEOUS | 5 refills | Status: AC
  Filled 2023-01-13: qty 3, 30d supply, fill #0
  Filled 2023-02-07 – 2023-02-15 (×2): qty 3, 28d supply, fill #0
  Filled 2023-03-17: qty 3, 28d supply, fill #1

## 2023-01-13 MED ORDER — TRESIBA FLEXTOUCH 200 UNIT/ML ~~LOC~~ SOPN
80.0000 [IU] | PEN_INJECTOR | Freq: Every day | SUBCUTANEOUS | 5 refills | Status: DC
  Filled 2023-01-13 – 2023-02-01 (×2): qty 12, 30d supply, fill #0
  Filled 2023-03-01 – 2023-03-02 (×4): qty 12, 30d supply, fill #1
  Filled 2023-03-17: qty 12, 30d supply, fill #2
  Filled ????-??-??: fill #2

## 2023-01-13 MED ORDER — FREESTYLE LIBRE 3 READER DEVI
1 refills | Status: AC
  Filled 2023-01-13: qty 1, 34d supply, fill #0

## 2023-01-13 MED ORDER — FREESTYLE LIBRE 3 SENSOR MISC
1.0000 | 5 refills | Status: AC
  Filled 2023-01-13: qty 2, 28d supply, fill #0
  Filled 2023-02-04 – 2023-02-14 (×3): qty 2, 28d supply, fill #1
  Filled 2023-03-22: qty 2, 28d supply, fill #2

## 2023-01-13 MED ORDER — SODIUM CHLORIDE 0.9 % IV SOLN
510.0000 mg | Freq: Once | INTRAVENOUS | Status: AC
  Administered 2023-01-13: 510 mg via INTRAVENOUS
  Filled 2023-01-13: qty 510

## 2023-01-14 ENCOUNTER — Other Ambulatory Visit: Payer: Self-pay

## 2023-01-14 ENCOUNTER — Other Ambulatory Visit: Payer: Self-pay | Admitting: Adult Health

## 2023-01-14 ENCOUNTER — Telehealth: Payer: Self-pay | Admitting: Adult Health

## 2023-01-14 ENCOUNTER — Other Ambulatory Visit: Payer: Self-pay | Admitting: Internal Medicine

## 2023-01-14 DIAGNOSIS — J449 Chronic obstructive pulmonary disease, unspecified: Secondary | ICD-10-CM

## 2023-01-14 MED ORDER — IPRATROPIUM-ALBUTEROL 0.5-2.5 (3) MG/3ML IN SOLN
3.0000 mL | Freq: Four times a day (QID) | RESPIRATORY_TRACT | 1 refills | Status: DC | PRN
  Filled 2023-01-14: qty 180, 15d supply, fill #0
  Filled 2023-01-27: qty 90, 8d supply, fill #1

## 2023-01-14 NOTE — Progress Notes (Signed)
EPIC Encounter for ICM Monitoring  Patient Name: Tamara Silva is a 62 y.o. female Date: 01/14/2023 Primary Care Physican: Marcine Matar, MD Primary Cardiologist: Shirlee Latch Electrophysiologist: Elberta Fortis 07/30/2022 Office Weight: 318 lbs 12/07/2022 Weight: 315 lbs   Since 28-Dec-2022 Time in AF    0.0 hr/day (0.0%)                                                            Spoke with patient and heart failure questions reviewed.  Transmission results reviewed.  Pt reports Dr Shirlee Latch prescribed potassium to help with leg cramps from Torsemide. She has been trying to take Torsemide as prescribed.     Optivol thoracic impedance suggesting possible fluid accumulation starting 7/12-7/28.          Prescribed:  Torsemide 100 mg take 1 tablet (100 mg total) by mouth every morning and 0.5 tablet (50 mg total) every evening.  Pt gets cramping with 2nd Torsemide dosage. Potassium 20 mEq take 1 tablet daily Spironolactone 25 mg take 1 tablet daily Jardiance 10 mg take 1 tablet daily before breakfast   Labs: 12/28/2022 Creatinine 1.18, BUN 17, Potassium 3.8, Sodium 143, GFR 52 09/27/2022 Creatinine 1.59, BUN 27, Potassium 3.8, Sodium 140, GFR 37 09/03/2022 Creatinine 1.49, BUN 33, Potassium 4.2, Sodium 143 08/02/2022 Creatinine 1.58, BUN 30, Potassium 4.3, Sodium 144, GFR 37 05/28/2022 Creatinine 2.03, BUN 33, Potassium 3.8, Sodium 141, GFR 27 A complete set of results can be found in Results Review.   Recommendations: No changes and encouraged to call if experiencing any fluid symptoms.   Follow-up plan: ICM clinic phone appointment on 02/14/2023.   91 day device clinic remote transmission 01/18/2023.     EP/Cardiology Office Visits:  01/18/2023 with HF clinic.  Recall 03/02/2023 with Dr Elberta Fortis.   Copy of ICM check sent to Dr. Elberta Fortis.    3 month ICM trend: 01/10/2023.    12-14 Month ICM trend:     Karie Soda, RN 01/14/2023 12:51 PM

## 2023-01-14 NOTE — Telephone Encounter (Signed)
Pt. Called she needs ipratropium-albuterol (DUONEB)  [facility].due to almost out please advise pt.

## 2023-01-14 NOTE — Telephone Encounter (Signed)
Per chart someone attempted to refill on 12/30/22 but it did not make it to pharmacy.   Rx sent with 1 refill and patient must keep OV for further refills.

## 2023-01-17 ENCOUNTER — Other Ambulatory Visit: Payer: Self-pay

## 2023-01-17 ENCOUNTER — Telehealth (HOSPITAL_COMMUNITY): Payer: Self-pay

## 2023-01-17 NOTE — Telephone Encounter (Signed)
Called to confirm Heart & Vascular Transitions of Care appointment. Patient reminded to bring all medications and pill box organizer with them. Confirmed patient has transportation. Gave directions, instructed to utilize valet parking.  Confirmed appointment prior to ending call.   

## 2023-01-18 ENCOUNTER — Other Ambulatory Visit: Payer: Self-pay

## 2023-01-18 ENCOUNTER — Ambulatory Visit (INDEPENDENT_AMBULATORY_CARE_PROVIDER_SITE_OTHER): Payer: Medicaid Other

## 2023-01-18 ENCOUNTER — Encounter (HOSPITAL_COMMUNITY): Payer: Self-pay

## 2023-01-18 ENCOUNTER — Ambulatory Visit (HOSPITAL_COMMUNITY)
Admission: RE | Admit: 2023-01-18 | Discharge: 2023-01-18 | Disposition: A | Discharge status: Home / Self Care | Payer: Medicaid Other | Source: Ambulatory Visit | Attending: Family Medicine | Admitting: Family Medicine

## 2023-01-18 VITALS — BP 120/56 | HR 72 | Wt 328.6 lb

## 2023-01-18 DIAGNOSIS — I2510 Atherosclerotic heart disease of native coronary artery without angina pectoris: Secondary | ICD-10-CM

## 2023-01-18 DIAGNOSIS — I110 Hypertensive heart disease with heart failure: Secondary | ICD-10-CM | POA: Diagnosis present

## 2023-01-18 DIAGNOSIS — I4891 Unspecified atrial fibrillation: Secondary | ICD-10-CM

## 2023-01-18 DIAGNOSIS — I130 Hypertensive heart and chronic kidney disease with heart failure and stage 1 through stage 4 chronic kidney disease, or unspecified chronic kidney disease: Secondary | ICD-10-CM | POA: Insufficient documentation

## 2023-01-18 DIAGNOSIS — G4733 Obstructive sleep apnea (adult) (pediatric): Secondary | ICD-10-CM

## 2023-01-18 DIAGNOSIS — Z8674 Personal history of sudden cardiac arrest: Secondary | ICD-10-CM | POA: Diagnosis not present

## 2023-01-18 DIAGNOSIS — Z5912 Inadequate housing utilities: Secondary | ICD-10-CM | POA: Diagnosis not present

## 2023-01-18 DIAGNOSIS — N183 Chronic kidney disease, stage 3 unspecified: Secondary | ICD-10-CM

## 2023-01-18 DIAGNOSIS — I469 Cardiac arrest, cause unspecified: Secondary | ICD-10-CM

## 2023-01-18 DIAGNOSIS — I5022 Chronic systolic (congestive) heart failure: Secondary | ICD-10-CM | POA: Diagnosis not present

## 2023-01-18 DIAGNOSIS — I272 Pulmonary hypertension, unspecified: Secondary | ICD-10-CM

## 2023-01-18 DIAGNOSIS — D649 Anemia, unspecified: Secondary | ICD-10-CM | POA: Diagnosis not present

## 2023-01-18 DIAGNOSIS — J449 Chronic obstructive pulmonary disease, unspecified: Secondary | ICD-10-CM

## 2023-01-18 DIAGNOSIS — Z86711 Personal history of pulmonary embolism: Secondary | ICD-10-CM

## 2023-01-18 DIAGNOSIS — Z139 Encounter for screening, unspecified: Secondary | ICD-10-CM

## 2023-01-18 LAB — CUP PACEART REMOTE DEVICE CHECK
Battery Remaining Longevity: 106 mo
Battery Voltage: 3.01 V
Brady Statistic RV Percent Paced: 0.01 %
Date Time Interrogation Session: 20240820012506
HighPow Impedance: 65 Ohm
Implantable Lead Connection Status: 753985
Implantable Lead Implant Date: 20201104
Implantable Lead Location: 753860
Implantable Pulse Generator Implant Date: 20201104
Lead Channel Impedance Value: 266 Ohm
Lead Channel Impedance Value: 323 Ohm
Lead Channel Pacing Threshold Amplitude: 0.5 V
Lead Channel Pacing Threshold Pulse Width: 0.4 ms
Lead Channel Sensing Intrinsic Amplitude: 16.25 mV
Lead Channel Sensing Intrinsic Amplitude: 16.25 mV
Lead Channel Setting Pacing Amplitude: 2.5 V
Lead Channel Setting Pacing Pulse Width: 0.4 ms
Lead Channel Setting Sensing Sensitivity: 0.3 mV
Zone Setting Status: 755011
Zone Setting Status: 755011

## 2023-01-18 LAB — BRAIN NATRIURETIC PEPTIDE: B Natriuretic Peptide: 119.8 pg/mL — ABNORMAL HIGH (ref 0.0–100.0)

## 2023-01-18 LAB — BASIC METABOLIC PANEL
Anion gap: 11 (ref 5–15)
BUN: 23 mg/dL (ref 8–23)
CO2: 25 mmol/L (ref 22–32)
Calcium: 9 mg/dL (ref 8.9–10.3)
Chloride: 104 mmol/L (ref 98–111)
Creatinine, Ser: 1.44 mg/dL — ABNORMAL HIGH (ref 0.44–1.00)
GFR, Estimated: 41 mL/min — ABNORMAL LOW (ref >60)
Glucose, Bld: 114 mg/dL — ABNORMAL HIGH (ref 70–99)
Potassium: 3.5 mmol/L (ref 3.5–5.1)
Sodium: 140 mmol/L (ref 135–145)

## 2023-01-18 NOTE — Patient Instructions (Signed)
Medication Changes:  No Changes In Medications at this time.   Lab Work:  Labs done today, your results will be available in MyChart, we will contact you for abnormal readings.  Then return for labs as scheduled in 2 months   Referrals:  YOU HAVE BEEN REFERRED TO CARDIAC REHAB THEY WILL REACH OUT TO YOU OR CALL TO ARRANGE THIS. PLEASE CALL us WITH ANY CONCERNS   Follow-Up in: 3-4 MONTHS AS SCHEDULED   At the [facility], you and your health needs are our priority. We have a designated team specialized in the treatment of Heart Failure. This Care Team includes your primary Heart Failure Specialized Cardiologist (physician), Advanced Practice Providers (APPs- Physician Assistants and Nurse Practitioners), and Pharmacist who all work together to provide you with the care you need, when you need it.   You may see any of the following providers on your designated Care Team at your next follow up:  Dr. Arvilla Meres Dr. Marca Ancona Dr. Marcos Eke, NP Robbie Lis, [place] [facility] [place], [place] Brynda Peon, NP Karle Plumber, PharmD   Please be sure to bring in all your medications bottles to every appointment.   Need to Contact us:  If you have any questions or concerns before your next appointment please send [place] a message through [facility] or call our office at [phone].    TO LEAVE A MESSAGE FOR THE NURSE SELECT OPTION 2, PLEASE LEAVE A MESSAGE INCLUDING: YOUR NAME DATE OF BIRTH CALL BACK NUMBER REASON FOR CALL**this is important as we prioritize the call backs  YOU WILL RECEIVE A CALL BACK THE SAME DAY AS LONG AS YOU CALL BEFORE 4:00 PM

## 2023-01-18 NOTE — Progress Notes (Signed)
[name], DOB 10-15-1960, MRN [id number]   Provider location: [facility] Type of Visit: Established patient   PCP:  Marcine Matar, MD Endocrinology: Dr. Lucianne Muss  Nephrology: Dr. Malen Gauze HF Cardiology: Dr Shirlee Latch   History of Present Illness: Tamara Silva is a 62 y.o. 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  She has had frequent admits for anemia and CHF with transfusions and diuresis. Prasugrel was stopped. No source of bleeding found on EGD and colonoscopy.   She is on home 02 and nocturnal BiPAP for OHS/OSA and COPD.   She no longer smokes.  Hx of PE in 2014.    Hx of bradycardia with Coreg at higher dose.  Echo 05/31/18 with EF 40-45%, PA peak pressure 71 mmHg.    Admitted 10/25/18 with SOB and edema, + productive cough.  This started over a few weeks prior to admission. Hospital course complicated by PEA arrest on 10/28/18. CPR for 25 minutes. Extubated 10/30/18.  Developed AKI.  HF team followed closely to optimize HF.  She was massively volume overloaded and extensively diuresed, weight down 65 lbs total.  Echo showed EF down to 30-35%.  No coronary angiography due to AKI.   Readmitted  11/17/18 with lower extremity edema and volume overload. Diuresed with IV lasix and transitioned to torsemide 40 mg twice daily. Discharged on 11/21/18 to home on oxygen.   She had RHC/LHC in 9/20, most significant stenosis was 75% ostial D1.  There was mild to moderate nonobstructive disease in other vessels. There was not an interventional target.  Filling pressures were mildly elevated with preserved cardiac output.  Torsemide was increased.   Echo in 10/20 showed that EF remained 30-35% with wall motion abnormalities, RV appeared normal.   Medtronic ICD placed in 11/20.   Echo in 2/23 showed EF 30-35%, dyskinetic apex, severe LV dilation,  normal RV size and systolic function.  Admitted 5/23 with AKI, given IVF fluids. GDMT held. SCr improved and Bidil, torsemide and spironolactone restarted. Remained off Entresto. Bisoprolol stopped due to bradycardia and junctional rhythm. Discharged home, weight 332 lbs.   Echo 7/24 EF 35-40%, per-apical akinesis, mildly decreased RV systolic function, PASP 55 mmHg, IVC dilated, mild-moderate MR.   Today she returns for HF follow up with her boyfriend. Overall feeling fine. She has SOB with ADLs, this is her baseline. Breathing is worse in the heat of the summer. Denies palpitations, CP, dizziness, edema, or PND/Orthopnea. Appetite ok. No fever or chills. She is not weighing at home. Taking all medications. Wears 3 L oxygen, wears BiPap at night and with naps. Behind payment on her electric bill. Smokes 2 cigarettes/day. Requesting referral to CR or exercise program.  Device interrogation (personally reviewed): OptiVol stable, thoracic impedence above reference line, no AF or VT, 0.8 hr/day activity, < 0.1% VP  ECG (personally reviewed): none ordered today  Labs (7/20): K 3.7, creatinine 1.56 Labs (9/20): K 3.7, creatinine 1.46, LDL 52, HDL 56  Labs (12/20): K 3.8, creatinine 1.51 Labs (1/21): hgb 10.6, K 4.4, creatinine 1.91 Labs (5/21): LDL 68, HDL 56 Labs (7/21): K 4.4, creatinine 2.34, hgb 9.7 Labs (11/22): K 4.4., creatinine 2.30, hgb 9.3 Labs (1/23): K 4.0, creatinine 2.32 Labs (2/23): LDL 55, TGs 74 Labs (3/23): K 4.4, creatinine 2.33 Labs (4/23): Creatinine  1.88 Labs (5/23): K 4.7, creatinine 1.81, BNP 78.5 Labs (10/23): K 3.7, creatinine 1.6  Labs (4/24): K 4.2, creatinine 1.49 => 1.59, BNP 117, LDL 97 Labs (7/24): K 3.8, creatinine 1.18, LDL 88  PMH: 1. CVA (1/20). 2. Type 2 diabetes 3. HTN 4. H/o seizures 5. PEA arrest (5/20) with CPR.  6. COPD: Home oxygen.   7. CAD: PCI [facility] 2006, [facility] 2009.  - Inferior STEMI 5/15, DES to RCA.  - Poor responder to  Plavix.  - LHC (9/20): 75% ostial D1, 40-50% mLCx, 40% proximal PLV.  8. Hyperlipidemia 9. PE: 2014, LLL.  10. OHS/OSA: Home oxygen and Bipap used.  11. Chronic systolic CHF: Suspect primarily ischemic cardiomyopathy. Medtronic ICD.  - Echo (5/15): EF 35-40% - Echo (2/16): EF 40-45% - Echo (1/20): EF 40-45% - Echo (5/20): EF 30-35%, mild RV dilation.  - RHC (5/20): mean RA 9, PA 62/22 mean 37, mean PCWP 15, CI 4.29, PVR 2.1 WU.  - RHC (9/20): mean RA 11, PA 66/18 mean 37, mean PCWP 19, CI 3.12, PVR 2.4 WU - Echo (10/20): EF 30-35% with wall motion abnormalities, normal RV.  - Echo (2/23): EF 30-35%, dyskinetic apex, severe LV dilation, normal RV size and systolic function. - Echo (7/24): EF 35-40%, per-apical akinesis, mildly decreased RV systolic function, PASP 55 mmHg, IVC dilated, mild-moderate MR.  12. CKD: Stage 3.  13. Fe deficiency anemia: EGD and colonoscopy without definite source of bleeding.   ROS: All systems negative except as listed in HPI, PMH and Problem List.  SH:  Social History   Socioeconomic History   Marital status: Single    Spouse name: Not on file   Number of children: 1   Years of education: Not on file   Highest education level: Not on file  Occupational History   Not on file  Tobacco Use   Smoking status: Some Days    Current packs/day: 0.00    Average packs/day: 0.5 packs/day for 20.0 years (10.0 ttl pk-yrs)    Types: Cigarettes    Start date: 05/31/1998    Last attempt to quit: 05/31/2018    Years since quitting: 4.6   Smokeless tobacco: Never   Tobacco comments:    smoked off and on x 20 years.  Still smokes some when stressed.  No sure how often or how much.  07/22/2022  hfb  Vaping Use   Vaping status: Never Used  Substance and Sexual Activity   Alcohol use: No    Alcohol/week: 0.0 standard drinks of alcohol   Drug use: Not Currently    Types: Cocaine   Sexual activity: Yes  Other Topics Concern   Not on file  Social History Narrative    Origibnally from [place]   Most recently from [place]   Daughter lives in town      On Social security to CHF, COPD, CAD   Social Determinants of Health   Financial Resource Strain: Medium Risk (01/13/2018)   Overall Financial Resource Strain (CARDIA)    Difficulty of Paying Living Expenses: Somewhat hard  Food Insecurity: Food Insecurity Present (01/13/2018)   Hunger Vital Sign    Worried About Running Out of Food in the Last Year: Sometimes true    Ran Out of Food in the Last Year: Sometimes true  Transportation Needs: Unmet Transportation Needs (01/06/2023)   PRAPARE - Administrator, Civil Service (Medical): Yes    Lack of Transportation (Non-Medical): Yes  Physical Activity:  Inactive (01/13/2018)   Exercise Vital Sign    Days of Exercise per Week: 0 days    Minutes of Exercise per Session: 0 min  Stress: Stress Concern Present (01/13/2018)   [employer] of Occupational Health - Occupational Stress Questionnaire    Feeling of Stress : Rather much  Social Connections: Unknown (03/17/2022)   Received from [facility]   Social Network    Social Network: Not on file  Intimate Partner Violence: Unknown (03/17/2022)   Received from [facility]   HITS    Physically Hurt: Not on file    Insult or Talk Down To: Not on file    Threaten Physical Harm: Not on file    Scream or Curse: Not on file   FH:  Family History  Problem Relation Age of Onset   Stroke Mother    Hypertension Mother    Colon cancer Father    Diabetes Father    Heart attack Father    Sarcoidosis Other    Breast cancer Paternal Aunt    Emphysema Brother    Lung disease Brother        Unknown type, 3 brothers, one with liver and lung disease   Diabetes Brother    Asthma Paternal Aunt    Diabetes Sister     Current Outpatient Medications  Medication Sig Dispense Refill   acetaminophen (TYLENOL) 500 MG tablet Take 1,000 mg by mouth every 6 (six) hours as needed for moderate pain or  headache.     albuterol (PROVENTIL) (2.5 MG/3ML) 0.083% nebulizer solution Take 3 mLs (2.5 mg total) by nebulization every 6 (six) hours as needed for wheezing or shortness of breath. 120 mL 0   apixaban (ELIQUIS) 5 MG TABS tablet Take 1 tablet (5 mg total) by mouth 2 (two) times daily. 60 tablet 3   ascorbic acid (VITAMIN C) 500 MG tablet TAKE ONE TABLET BY MOUTH ONCE DAILY 90 tablet 3   atorvastatin (LIPITOR) 80 MG tablet Take 1 tablet (80 mg total) by mouth daily. 90 tablet 3   BIDIL 20-37.5 MG tablet TAKE 1 TABLET BY MOUTH 3 (THREE) TIMES DAILY. 270 tablet 3   Blood Glucose Monitoring Suppl (ACCU-CHEK GUIDE) w/Device KIT Use accu chek guide to check blood sugar three times daily. 1 kit 0   Continuous Blood Gluc Sensor (FREESTYLE LIBRE 2 SENSOR) MISC USE ONE SENSOR EVERY 14 (FOURTEEN) DAYS. (Patient taking differently: USE ONE SENSOR EVERY 14 (FOURTEEN) DAYS.(Libre 3)) 2 each 0   Continuous Glucose Receiver (FREESTYLE LIBRE 3 READER) DEVI use as directed 8-10 times daily to check blood sugars 1 each 1   Continuous Glucose Sensor (FREESTYLE LIBRE 2 SENSOR) MISC Use one sensor to monitor blood sugars every 14 days 28 days 2 each 11   Continuous Glucose Sensor (FREESTYLE LIBRE 3 SENSOR) MISC Apply a new sensor every 14 days to frequently monitor blood sugar 30 days 2 each 5   D3-1000 25 MCG (1000 UT) tablet Take 1,000 Units by mouth 2 (two) times daily.     empagliflozin (JARDIANCE) 10 MG TABS tablet Take 1 tablet (10 mg total) by mouth daily before breakfast. 90 tablet 3   FEROSUL 325 (65 Fe) MG tablet TAKE 1 TABLET (325 MG TOTAL) BY MOUTH DAILY AFTER SUPPER. PLEASE TAKE WITH A SOURCE OF VITAMIN C 90 tablet 3   fluticasone (FLONASE) 50 MCG/ACT nasal spray Place 1 spray into both nostrils daily. 16 g 6   glucose blood (ACCU-CHEK AVIVA PLUS) test strip USE  1 STRIP TO CHECK BLOOD SUGAR 3 TIMES A DAY . 100 each 2   insulin degludec (TRESIBA FLEXTOUCH) 200 UNIT/ML FlexTouch Pen INJECT 80 UNITS  Subcutaneous once daily she will need 4 pens for a 30 day supply 30 days 12 mL 5   Insulin Pen Needle (EASY COMFORT PEN NEEDLES) 31G X 5 MM MISC USE 3 TIMES A DAY FOR INSULIN ADMINISTRATION 100 each 12   NEEDLE, DISP, 26 G 26G X 1/2" MISC 1 application by Does not apply route daily. 100 each 0   omeprazole (PRILOSEC) 40 MG capsule Take 1 capsule (40 mg total) by mouth daily. 90 capsule 1   potassium chloride SA (KLOR-CON M) 20 MEQ tablet Take 1 tablet (20 mEq total) by mouth daily. 90 tablet 3   RESTASIS 0.05 % ophthalmic emulsion Place 1 drop into both eyes daily as needed (dry eyes).     sacubitril-valsartan (ENTRESTO) 49-51 MG Take 1 tablet by mouth 2 (two) times daily. 180 tablet 1   Semaglutide, 2 MG/DOSE, (OZEMPIC, 2 MG/DOSE,) 8 MG/3ML SOPN INJECT 2 MG Subcutaneous once weekly 30 days 3 mL 5   sennosides-docusate sodium (SENOKOT-S) 8.6-50 MG tablet Take 1 tablet by mouth 2 (two) times daily.     spironolactone (ALDACTONE) 25 MG tablet Take 0.5 tablets (12.5 mg total) by mouth daily. (Patient taking differently: Take 25 mg by mouth daily.) 45 tablet 3   torsemide (DEMADEX) 100 MG tablet Take 1 tablet (100 mg total) by mouth in the morning AND 0.5 tablets (50 mg total) every evening. 180 tablet 3   umeclidinium-vilanterol (ANORO ELLIPTA) 62.5-25 MCG/ACT AEPB Inhale 1 puff into the lungs daily. 60 each 11   vitamin B-12 (CYANOCOBALAMIN) 1000 MCG tablet Take 1,000 mcg by mouth daily.     No current facility-administered medications for this encounter.   BP (!) 120/56   Pulse 72   Wt (!) 149.1 kg (328 lb 9.6 oz)   LMP 05/30/2013   SpO2 99% Comment: Patient on 3-liters of room oxygen.  BMI 51.47 kg/m   Wt Readings from Last 3 Encounters:  01/18/23 (!) 149.1 kg (328 lb 9.6 oz)  09/27/22 (!) 147 kg (324 lb)  09/03/22 (!) 147.5 kg (325 lb 3.2 oz)   Physical Exam:   General:  NAD. No resp difficulty, arrived in [facility] on oxygen HEENT: Normal Neck: Supple. No JVD. Carotids 2+ bilat; no bruits.  No lymphadenopathy or thryomegaly appreciated. Cor: PMI nondisplaced. Regular rate & rhythm. No rubs, gallops or murmurs. Lungs: Clear, diminished in bases Abdomen: Soft, obese, nontender, nondistended. No hepatosplenomegaly. No bruits or masses. Good bowel sounds. Extremities: No cyanosis, clubbing, rash, edema Neuro: Alert & oriented x 3, cranial nerves grossly intact. Moves all 4 extremities w/o difficulty. Affect pleasant.  Assessment & Plan: 1. H/O PEA arrest: Due to severe hypoxemia in 5/20 in setting of CHF exacerbation.   2. Chronic systolic CHF: Suspect primarily ischemic cardiomyopathy.  Medtronic ICD.  Echo in 1/20 with EF 40-45%, RV moderately dilated, PASP 71 mmHg.  Echo in 5/20 with lower EF, 30-35%, and mildly dilated RV.  Significant RV failure, may be related to OHS/OSA and COPD. She was massively volume overloaded during 5/20 admission and diuresed extensively.  RHC done 6/20 showed high cardiac output and minimally elevated filling pressures after diuresis.  PVR was not elevated.  RHC was done again in 9/20, filling pressures were elevated with preserved cardiac output.  Echo in 10/20 showed EF still 30-35%.  Echo in 2/23 with EF  30-35% RV normal. Echo 7/24 showed EF 35-40%, per-apical akinesis, mildly decreased RV systolic function, PASP 55 mmHg, IVC dilated, mild-moderate MR.  Chronically NYHA class III (confounded to a certain extent by lung disease). She is not volume overloaded on exam or by OptiVol, although weight up 4 lbs. Suspect this is caloric. - Continue torsemide 100 mg /50 qpm + KCl 20 daily.  BMET and BNP today. - Continue Jardiance 10 mg daily. NO GU symptoms. - Continue Bidil 1 tab tid.   - Continue Entresto 49/51 bid.  - Continue spironolactone 25 mg daily.  - Off bisoprolol with bradycardia and junctional rhythm. Will not add back today. - Refer to cardiac rehab.  If unable, will refer to PREP program. 3. CKD: Stage III: Continue SGLT2i. BMET today. 4. COPD: On  home oxygen at baseline.   - Continues to smoke a few cigarettes, urged her to quit.  5. OHS/OSA: On home oxygen at all times and wears Bipap at night. She is followed by Pulmonary. 6. CAD: History of PCI, most recently had inferior STEMI in 5/15 with DES to RCA.  Coronary angiography was done in 9/20, there was 75% ostial D1 stenosis but no good target for PCI. No chest pain.   - Continue atorvastatin 80 mg daily. Repeat LFTs/lipids arranged 10/24. - Given stable CAD and apixaban use, now off ASA. 7. Remote PE: She is on Eliquis for AF.   8. Anemia: History of GI bleeding, had endoscopies earlier in 2021 with no bleeding source found.  Also with history of B12 deficiency. She has seen Dr. Leonides Schanz for this. - Recent iron studies suggest IDA, she is on oral iron. We have recommended iron infusion.  9. Pulmonary hypertension: Pulmonary venous hypertension on last RHC in 9/20.  10. Obesity: Body mass index is 51.47 kg/m. - She is on semaglutide.   11. Atrial fibrillation: No recent episodes on device interrogation. Regular on exam today. - Continue Eliquis. No bleeding issues. 12. SDOH: engage HFSW to help with resources for utility bills. - Gifted a scale today.  Follow up in 3-4 months with Dr. Kathreen Cornfield [facility] FNP-BC 01/18/2023

## 2023-01-19 ENCOUNTER — Telehealth (HOSPITAL_COMMUNITY): Payer: Self-pay

## 2023-01-19 NOTE — Telephone Encounter (Signed)
Called in regards to cardiac rehab referral. LM on VM

## 2023-01-20 ENCOUNTER — Other Ambulatory Visit: Payer: Self-pay

## 2023-01-20 ENCOUNTER — Telehealth (HOSPITAL_COMMUNITY): Payer: Self-pay | Admitting: Licensed Clinical Social Worker

## 2023-01-20 NOTE — Telephone Encounter (Signed)
H&V Care Navigation CSW Progress Note  Clinical Social Worker consulted to speak with pt regarding utility shut off notice.  Pt reports she owes at least $380 by 8/28 or her electricity will be shut off- owes over $700 overall.  CSW explained patient care fund and requirements to utilize- she will gather income information for her and her spouse as well as Risk analyst and get back with CSW once she has these items.  Patient is participating in a Managed Medicaid Plan:  Yes  SDOH Screenings   Food Insecurity: Food Insecurity Present (01/13/2018)  Transportation Needs: Unmet Transportation Needs (01/06/2023)  Utilities: At Risk (01/20/2023)  Depression (PHQ2-9): Medium Risk (08/03/2022)  Financial Resource Strain: Medium Risk (01/13/2018)  Physical Activity: Inactive (01/13/2018)  Social Connections: Unknown (03/17/2022)   Received from [facility]  Stress: Stress Concern Present (01/13/2018)  Tobacco Use: High Risk (01/18/2023)   [name], LCSW Clinical Social Worker [facility] Desk#: [phone] Cell#: [phone]

## 2023-01-21 ENCOUNTER — Other Ambulatory Visit: Payer: Self-pay | Admitting: Internal Medicine

## 2023-01-21 ENCOUNTER — Telehealth: Payer: Self-pay | Admitting: Internal Medicine

## 2023-01-21 ENCOUNTER — Other Ambulatory Visit: Payer: Self-pay

## 2023-01-21 MED ORDER — OMEPRAZOLE 40 MG PO CPDR
40.0000 mg | DELAYED_RELEASE_CAPSULE | Freq: Every day | ORAL | 0 refills | Status: DC
  Filled 2023-01-21: qty 90, 90d supply, fill #0

## 2023-01-21 NOTE — Telephone Encounter (Signed)
Medication Refill - Medication:  omeprazole (PRILOSEC) 40 MG capsule   Has the patient contacted their pharmacy? Yes.   (Pt states she really needs and pharmacy advised her to call the dr  Preferred Pharmacy (with phone number or street name): [facility]  Has the patient been seen for an appointment in the last year OR does the patient have an upcoming appointment? Yes.    Agent: Please be advised that RX refills may take up to 3 business days. We ask that you follow-up with your pharmacy.

## 2023-01-21 NOTE — Telephone Encounter (Signed)
Already sent in a separate refill encounter today.

## 2023-01-24 ENCOUNTER — Telehealth (HOSPITAL_COMMUNITY): Payer: Self-pay | Admitting: Licensed Clinical Social Worker

## 2023-01-24 NOTE — Telephone Encounter (Signed)
H&V Care Navigation CSW Progress Note  Clinical Social Worker received all necessary paperwork from pt to complete Patient Care fund request to pay overdue amount for [employer] and avoid turn off currently scheduled for 8/28.  CSW submitted check request and called in pledge to [facility] [employer] so they could avoid shut off.  Patient is participating in a Managed Medicaid Plan:  Yes  SDOH Screenings   Food Insecurity: Food Insecurity Present (01/13/2018)  Transportation Needs: Unmet Transportation Needs (01/06/2023)  Utilities: At Risk (01/20/2023)  Depression (PHQ2-9): Medium Risk (08/03/2022)  Financial Resource Strain: High Risk (01/24/2023)  Physical Activity: Inactive (01/13/2018)  Social Connections: Unknown (03/17/2022)   Received from [facility]  Stress: Stress Concern Present (01/13/2018)  Tobacco Use: High Risk (01/18/2023)   [name], LCSW Clinical Social Worker [facility] Desk#: [phone] Cell#: [phone]

## 2023-01-25 NOTE — Telephone Encounter (Signed)
-----   Message from Marca Ancona sent at 01/24/2023  4:28 PM EDT ----- Regarding: RE: Oxygen therapy in Cardiac Rehab >90% ----- Message ----- From: Chelsea Aus, RN Sent: 01/24/2023  11:41 AM EDT To: Laurey Morale, MD Subject: Oxygen therapy in Cardiac Rehab                 Dr. Shirlee Latch,  Pt uses home oxygen at [facility].  What is an acceptable oxygen saturation for this pt rest and exertion? May we titrate the oxygen level to meet the oxygen saturation level from above?   Thank you and we are looking forward to working with Sophy again.  Alanson Aly, BSN Cardiac and Emergency planning/management officer

## 2023-01-27 ENCOUNTER — Other Ambulatory Visit (HOSPITAL_COMMUNITY): Payer: Self-pay

## 2023-01-27 ENCOUNTER — Telehealth: Payer: Self-pay | Admitting: Pulmonary Disease

## 2023-01-27 ENCOUNTER — Other Ambulatory Visit: Payer: Self-pay

## 2023-01-27 DIAGNOSIS — J449 Chronic obstructive pulmonary disease, unspecified: Secondary | ICD-10-CM

## 2023-01-27 NOTE — Telephone Encounter (Signed)
Please see last encounter from 8/17. Pt states she needs more Duoneb. The pharm only gave her two boxes and she needed three. Please send to the same community Pharm. She states she is out and needs to be called in today.

## 2023-01-28 ENCOUNTER — Other Ambulatory Visit: Payer: Self-pay

## 2023-01-28 MED ORDER — IPRATROPIUM-ALBUTEROL 0.5-2.5 (3) MG/3ML IN SOLN
3.0000 mL | Freq: Four times a day (QID) | RESPIRATORY_TRACT | 1 refills | Status: DC | PRN
  Filled 2023-01-28 – 2023-02-01 (×2): qty 180, 15d supply, fill #0
  Filled 2023-02-15: qty 180, 15d supply, fill #1

## 2023-01-28 NOTE — Progress Notes (Signed)
Remote ICD transmission.   

## 2023-01-28 NOTE — Telephone Encounter (Signed)
Duoneb refill sent

## 2023-02-01 ENCOUNTER — Other Ambulatory Visit: Payer: Self-pay

## 2023-02-04 ENCOUNTER — Other Ambulatory Visit: Payer: Self-pay

## 2023-02-04 MED FILL — Isosorbide Dinitrate-Hydralazine HCl Tab 20-37.5 MG: ORAL | 30 days supply | Qty: 90 | Fill #4 | Status: AC

## 2023-02-07 ENCOUNTER — Other Ambulatory Visit: Payer: Self-pay

## 2023-02-11 ENCOUNTER — Telehealth (HOSPITAL_COMMUNITY): Payer: Self-pay | Admitting: Licensed Clinical Social Worker

## 2023-02-11 NOTE — Telephone Encounter (Signed)
Pt asked CSW for list of local affordable housing options.  CSW sent list of [facility] affordable senior living communities.  Will continue to follow and assist as needed  [name], LCSW Clinical Social Worker [facility] Desk#: [phone] Cell#: [phone]

## 2023-02-14 ENCOUNTER — Other Ambulatory Visit: Payer: Self-pay

## 2023-02-14 ENCOUNTER — Ambulatory Visit: Payer: Medicaid Other | Attending: Cardiology

## 2023-02-14 DIAGNOSIS — Z95810 Presence of automatic (implantable) cardiac defibrillator: Secondary | ICD-10-CM | POA: Diagnosis not present

## 2023-02-14 DIAGNOSIS — I5022 Chronic systolic (congestive) heart failure: Secondary | ICD-10-CM

## 2023-02-15 ENCOUNTER — Other Ambulatory Visit: Payer: Self-pay

## 2023-02-15 NOTE — Progress Notes (Signed)
EPIC Encounter for ICM Monitoring  Patient Name: Tamara Silva is a 62 y.o. female Date: 02/15/2023 Primary Care Physican: Marcine Matar, MD Primary Cardiologist: Shirlee Latch Electrophysiologist: Elberta Fortis 07/30/2022 Office Weight: 318 lbs 12/07/2022 Weight: 315 lbs 02/15/2023 Weight: 328 lbs   Since 28-Dec-2022 Time in AF    0.0 hr/day (0.0%)                                                            Spoke with patient and heart failure questions reviewed.  Transmission results reviewed.  Pt reports Dr Shirlee Latch prescribed potassium to help with leg cramps from Torsemide. She has been trying to take Torsemide as prescribed.     Optivol thoracic impedance suggesting possible fluid accumulation since 9/12 and trending close to baseline.   Also suggesting possible fluid accumulation from 8/29-9/7   Prescribed:  Torsemide 100 mg take 1 tablet (100 mg total) by mouth every morning and 0.5 tablet (50 mg total) every evening.  02/15/2023 Pt reports taking it approximately 100 mg twice a week.  Unable to take more often due to severe cramping.  Has discussed with HF clinic on 8/20. Potassium 20 mEq take 1 tablet daily Spironolactone 25 mg take 1 tablet daily Jardiance 10 mg take 1 tablet daily before breakfast   Labs: 03/07/2023 BMET Scheduled at HF clinic 01/18/2023 Creatinine 1.44, BUN 23, Potassium 3.5, Sodium 140, GFR 41 12/28/2022 Creatinine 1.18, BUN 17, Potassium 3.8, Sodium 143, GFR 52 09/27/2022 Creatinine 1.59, BUN 27, Potassium 3.8, Sodium 140, GFR 37 09/03/2022 Creatinine 1.49, BUN 33, Potassium 4.2, Sodium 143 08/02/2022 Creatinine 1.58, BUN 30, Potassium 4.3, Sodium 144, GFR 37 05/28/2022 Creatinine 2.03, BUN 33, Potassium 3.8, Sodium 141, GFR 27 A complete set of results can be found in Results Review.   Recommendations:  She took Torsemide 100 mg x 2 days (after remote transmission received).  No changes and encouraged to call if experiencing any fluid symptoms.   Follow-up plan:  ICM clinic phone appointment on 03/21/2023.   91 day device clinic remote transmission 04/19/2023.     EP/Cardiology Office Visits:  05/20/2023 with Dr Shirlee Latch.  Recall 03/02/2023 with Dr Elberta Fortis.   Copy of ICM check sent to Dr. Elberta Fortis.    3 month ICM trend: 02/14/2023.    12-14 Month ICM trend:     Karie Soda, RN 02/15/2023 12:31 PM

## 2023-02-16 ENCOUNTER — Telehealth: Payer: Self-pay

## 2023-02-16 NOTE — Telephone Encounter (Signed)
Will discuss on upcoming visit

## 2023-02-16 NOTE — Telephone Encounter (Signed)
Dr.Johnson please send a referral to physical therapy for this patient to receive a mobility electric chair evaluation. Patient stated on 11/18/2022 telephone encounter that she would like to go forward with the referral. Please advise.

## 2023-02-16 NOTE — Telephone Encounter (Signed)
Copied from CRM [id number]. Topic: General - Other >> Feb 16, 2023  9:13 AM Macon Large wrote: Reason for CRM: Pt stated she has not heard back from anyone regarding a wheelchair. Pt requests call back to discuss possibly getting an electric style wheelchair. Cb# [phone]

## 2023-02-17 ENCOUNTER — Other Ambulatory Visit: Payer: Self-pay

## 2023-02-18 NOTE — Telephone Encounter (Signed)
Called but no answer. Unable to LVM due to VM being full.

## 2023-02-21 ENCOUNTER — Other Ambulatory Visit: Payer: Self-pay

## 2023-02-23 ENCOUNTER — Other Ambulatory Visit: Payer: Self-pay | Admitting: Pulmonary Disease

## 2023-02-23 ENCOUNTER — Other Ambulatory Visit: Payer: Self-pay

## 2023-02-23 DIAGNOSIS — J449 Chronic obstructive pulmonary disease, unspecified: Secondary | ICD-10-CM

## 2023-02-23 NOTE — Telephone Encounter (Signed)
Called & spoke to the patient. Verified name & DOB. Informed that patient will need to come in for an appointment to address concerns. Patient expressed verbal understanding. Patient stated that her grandmother passed away recently and her funeral will be over the weekend. Patient believes she will come in the appointment but will call to reschedule if needed. No further questions at this time.

## 2023-02-24 ENCOUNTER — Other Ambulatory Visit: Payer: Self-pay

## 2023-02-24 MED ORDER — IPRATROPIUM-ALBUTEROL 0.5-2.5 (3) MG/3ML IN SOLN
3.0000 mL | Freq: Four times a day (QID) | RESPIRATORY_TRACT | 1 refills | Status: DC | PRN
  Filled 2023-02-24 – 2023-02-28 (×2): qty 180, 15d supply, fill #0
  Filled 2023-03-17: qty 180, 15d supply, fill #1

## 2023-02-28 ENCOUNTER — Other Ambulatory Visit: Payer: Self-pay

## 2023-02-28 ENCOUNTER — Ambulatory Visit: Payer: Medicaid Other | Admitting: Internal Medicine

## 2023-03-01 ENCOUNTER — Other Ambulatory Visit: Payer: Self-pay

## 2023-03-02 ENCOUNTER — Other Ambulatory Visit: Payer: Self-pay

## 2023-03-02 ENCOUNTER — Other Ambulatory Visit (HOSPITAL_COMMUNITY): Payer: Self-pay

## 2023-03-03 ENCOUNTER — Other Ambulatory Visit: Payer: Self-pay

## 2023-03-04 ENCOUNTER — Telehealth (HOSPITAL_COMMUNITY): Payer: Self-pay | Admitting: Cardiology

## 2023-03-04 NOTE — Telephone Encounter (Signed)
Pt need transportation service, appt is Monday @9am , for Labs

## 2023-03-07 ENCOUNTER — Telehealth (HOSPITAL_COMMUNITY): Payer: Self-pay | Admitting: Licensed Clinical Social Worker

## 2023-03-07 ENCOUNTER — Ambulatory Visit (HOSPITAL_COMMUNITY)
Admission: RE | Admit: 2023-03-07 | Discharge: 2023-03-07 | Disposition: A | Discharge status: Home / Self Care | Payer: Medicaid Other | Source: Ambulatory Visit | Attending: Cardiology | Admitting: Cardiology

## 2023-03-07 ENCOUNTER — Other Ambulatory Visit: Payer: Self-pay

## 2023-03-07 ENCOUNTER — Other Ambulatory Visit: Payer: Self-pay | Admitting: *Deleted

## 2023-03-07 DIAGNOSIS — J449 Chronic obstructive pulmonary disease, unspecified: Secondary | ICD-10-CM

## 2023-03-07 DIAGNOSIS — I5022 Chronic systolic (congestive) heart failure: Secondary | ICD-10-CM | POA: Insufficient documentation

## 2023-03-07 DIAGNOSIS — E785 Hyperlipidemia, unspecified: Secondary | ICD-10-CM | POA: Insufficient documentation

## 2023-03-07 LAB — HEPATIC FUNCTION PANEL
ALT: 18 U/L (ref 0–44)
AST: 17 U/L (ref 15–41)
Albumin: 3.6 g/dL (ref 3.5–5.0)
Alkaline Phosphatase: 68 U/L (ref 38–126)
Bilirubin, Direct: <0.1 mg/dL (ref 0.0–0.2)
Total Bilirubin: 0.3 mg/dL (ref 0.3–1.2)
Total Protein: 7.7 g/dL (ref 6.5–8.1)

## 2023-03-07 LAB — LIPID PANEL
Cholesterol: 157 mg/dL (ref 0–200)
HDL: 59 mg/dL (ref >40)
LDL Cholesterol: 80 mg/dL (ref 0–99)
Total CHOL/HDL Ratio: 2.7 {ratio}
Triglycerides: 88 mg/dL (ref <150)
VLDL: 18 mg/dL (ref 0–40)

## 2023-03-07 LAB — BASIC METABOLIC PANEL
Anion gap: 10 (ref 5–15)
BUN: 36 mg/dL — ABNORMAL HIGH (ref 8–23)
CO2: 25 mmol/L (ref 22–32)
Calcium: 8.9 mg/dL (ref 8.9–10.3)
Chloride: 104 mmol/L (ref 98–111)
Creatinine, Ser: 1.91 mg/dL — ABNORMAL HIGH (ref 0.44–1.00)
GFR, Estimated: 29 mL/min — ABNORMAL LOW (ref >60)
Glucose, Bld: 134 mg/dL — ABNORMAL HIGH (ref 70–99)
Potassium: 4.1 mmol/L (ref 3.5–5.1)
Sodium: 139 mmol/L (ref 135–145)

## 2023-03-07 MED FILL — Isosorbide Dinitrate-Hydralazine HCl Tab 20-37.5 MG: ORAL | 30 days supply | Qty: 90 | Fill #5 | Status: AC

## 2023-03-07 NOTE — Telephone Encounter (Signed)
H&V Care Navigation CSW Progress Note  Clinical Social Worker received consult to assist with transportation.  Lab was set up too short notice to utilize Medicaid so CSW set up a taxi.  Patient is participating in a Managed Medicaid Plan:  Yes  SDOH Screenings   Food Insecurity: Food Insecurity Present (01/13/2018)  Transportation Needs: Unmet Transportation Needs (03/07/2023)  Utilities: At Risk (01/20/2023)  Depression (PHQ2-9): Medium Risk (08/03/2022)  Financial Resource Strain: High Risk (01/24/2023)  Physical Activity: Inactive (01/13/2018)  Social Connections: Unknown (03/17/2022)   Received from [facility]  Stress: Stress Concern Present (01/13/2018)  Tobacco Use: High Risk (01/18/2023)   03/07/2023  Tamara Silva DOB: 11-16-1960 MRN: [id number]   RIDER WAIVER AND RELEASE OF LIABILITY  For the purposes of helping with transportation needs, [facility] partners with outside transportation providers (taxi companies, [place], Catering manager.) to give [name] patients or other approved people the choice of on-demand rides [name]") to our buildings for non-emergency visits.  By using [employer], I, the person signing this document, on behalf of myself and/or any legal minors (in my care using the [employer]), agree:  Science writer given to me are supplied by independent, outside transportation providers who do not work for, or have any affiliation with, [name]. [name] is not a transportation company. [name] has no control over the quality or safety of the rides I get using [employer]. [name] has no control over whether any outside ride will happen on time or not. [name] gives no guarantee on the reliability, quality, safety, or availability on any rides, or that no mistakes will happen. I know and accept that traveling by vehicle (car, truck, SVU, Zenaida Niece, bus, taxi, etc.) has risks of serious injuries such as disability, being  paralyzed, and death. I know and agree the risk of using [employer] is mine alone, and not [employer]. Transport Services are provided "as is" and as are available. The transportation providers are in charge for all inspections and care of the vehicles used to provide these rides. I agree not to take legal action against [place], its agents, employees, officers, directors, representatives, insurers, attorneys, assigns, successors, subsidiaries, and affiliates at any time for any reasons related directly or indirectly to using [employer]. I also agree not to take legal action against [name] or its affiliates for any injury, death, or damage to property caused by or related to using [employer]. I have read this Waiver and Release of Liability, and I understand the terms used in it and their legal meaning. This Waiver is freely and voluntarily given with the understanding that my right (or any legal minors) to legal action against [name] relating to [employer] is knowingly given up to use these services.   I attest that I read the Ride Waiver and Release of Liability to Tamara Silva, gave [name] the opportunity to ask questions and answered the questions asked (if any). I affirm that Tamara Silva then provided consent for assistance with transportation.     [name]

## 2023-03-08 ENCOUNTER — Other Ambulatory Visit: Payer: Self-pay

## 2023-03-08 ENCOUNTER — Telehealth (HOSPITAL_COMMUNITY): Payer: Self-pay

## 2023-03-08 ENCOUNTER — Encounter: Payer: Self-pay | Admitting: Pulmonary Disease

## 2023-03-08 DIAGNOSIS — I5042 Chronic combined systolic (congestive) and diastolic (congestive) heart failure: Secondary | ICD-10-CM

## 2023-03-08 MED ORDER — TORSEMIDE 100 MG PO TABS: ORAL_TABLET | ORAL | 3 refills | Status: DC

## 2023-03-08 MED ORDER — EZETIMIBE 10 MG PO TABS: 10.0000 mg | ORAL_TABLET | Freq: Every day | ORAL | 3 refills | Status: AC

## 2023-03-08 NOTE — Telephone Encounter (Addendum)
Pt aware, agreeable, and verbalized understanding   ----- Message from Marca Ancona sent at 03/07/2023  2:45 PM EDT ----- LDL is too high, add Zetia 10 mg daily with lipids in 2 months.   Creatinine elevated.  Hold pm torsemide for a day, then change regimen to 100 qam, 50 mg every other afternoon.  BMET 10 days.

## 2023-03-09 ENCOUNTER — Ambulatory Visit: Payer: Medicaid Other | Admitting: Pulmonary Disease

## 2023-03-09 ENCOUNTER — Telehealth (HOSPITAL_COMMUNITY): Payer: Self-pay

## 2023-03-09 NOTE — Telephone Encounter (Signed)
Faxed Medicaid form.   Placed referral back in medicaid folder.

## 2023-03-16 ENCOUNTER — Telehealth: Payer: Self-pay | Admitting: Pulmonary Disease

## 2023-03-16 DIAGNOSIS — J449 Chronic obstructive pulmonary disease, unspecified: Secondary | ICD-10-CM

## 2023-03-16 NOTE — Telephone Encounter (Signed)
Patient needs a copy of medical records for her move  to [place]. She also needs a list of pulmonary doctors that she can see in [place] ([place]). She thought she had an appointment for today but it is for December 16th.

## 2023-03-17 ENCOUNTER — Other Ambulatory Visit: Payer: Self-pay

## 2023-03-17 NOTE — Telephone Encounter (Signed)
I have PT a records req form and told her to complete and return. She just needs that referral to a [place] doctor.

## 2023-03-18 ENCOUNTER — Telehealth (HOSPITAL_COMMUNITY): Payer: Self-pay

## 2023-03-18 ENCOUNTER — Ambulatory Visit (HOSPITAL_COMMUNITY)
Admission: RE | Admit: 2023-03-18 | Discharge: 2023-03-18 | Disposition: A | Discharge status: Home / Self Care | Payer: Medicaid Other | Source: Ambulatory Visit | Attending: Cardiology | Admitting: Cardiology

## 2023-03-18 DIAGNOSIS — I5042 Chronic combined systolic (congestive) and diastolic (congestive) heart failure: Secondary | ICD-10-CM | POA: Insufficient documentation

## 2023-03-18 LAB — BASIC METABOLIC PANEL
Anion gap: 10 (ref 5–15)
BUN: 27 mg/dL — ABNORMAL HIGH (ref 8–23)
CO2: 23 mmol/L (ref 22–32)
Calcium: 8.8 mg/dL — ABNORMAL LOW (ref 8.9–10.3)
Chloride: 106 mmol/L (ref 98–111)
Creatinine, Ser: 1.45 mg/dL — ABNORMAL HIGH (ref 0.44–1.00)
GFR, Estimated: 41 mL/min — ABNORMAL LOW (ref >60)
Glucose, Bld: 173 mg/dL — ABNORMAL HIGH (ref 70–99)
Potassium: 4.7 mmol/L (ref 3.5–5.1)
Sodium: 139 mmol/L (ref 135–145)

## 2023-03-18 NOTE — Telephone Encounter (Signed)
Referral to pulmonary doctor near [place] has been placed.

## 2023-03-18 NOTE — Telephone Encounter (Signed)
Called and spoke with pt in regards to CR, pt stated she is not interested at this time due to moving to [place].   Closed referral

## 2023-03-21 ENCOUNTER — Ambulatory Visit: Payer: Medicaid Other | Attending: Cardiology

## 2023-03-21 DIAGNOSIS — Z95810 Presence of automatic (implantable) cardiac defibrillator: Secondary | ICD-10-CM

## 2023-03-21 DIAGNOSIS — I5042 Chronic combined systolic (congestive) and diastolic (congestive) heart failure: Secondary | ICD-10-CM | POA: Diagnosis not present

## 2023-03-22 ENCOUNTER — Other Ambulatory Visit: Payer: Self-pay

## 2023-03-22 ENCOUNTER — Ambulatory Visit: Payer: Medicaid Other | Admitting: Adult Health

## 2023-03-22 MED ORDER — TRUEPLUS PEN NEEDLES 31G X 5 MM MISC
3 refills | Status: DC
  Filled 2023-03-22: qty 100, 90d supply, fill #0

## 2023-03-24 ENCOUNTER — Ambulatory Visit: Payer: Medicaid Other | Attending: Internal Medicine | Admitting: Physician Assistant

## 2023-03-24 ENCOUNTER — Other Ambulatory Visit: Payer: Self-pay

## 2023-03-24 ENCOUNTER — Encounter: Payer: Self-pay | Admitting: Physician Assistant

## 2023-03-24 ENCOUNTER — Telehealth (HOSPITAL_COMMUNITY): Payer: Self-pay | Admitting: Licensed Clinical Social Worker

## 2023-03-24 VITALS — BP 126/75 | HR 82 | Wt 294.4 lb

## 2023-03-24 DIAGNOSIS — Z23 Encounter for immunization: Secondary | ICD-10-CM

## 2023-03-24 DIAGNOSIS — Z7984 Long term (current) use of oral hypoglycemic drugs: Secondary | ICD-10-CM | POA: Diagnosis not present

## 2023-03-24 DIAGNOSIS — J449 Chronic obstructive pulmonary disease, unspecified: Secondary | ICD-10-CM | POA: Diagnosis not present

## 2023-03-24 DIAGNOSIS — Z794 Long term (current) use of insulin: Secondary | ICD-10-CM

## 2023-03-24 DIAGNOSIS — I10 Essential (primary) hypertension: Secondary | ICD-10-CM

## 2023-03-24 DIAGNOSIS — J302 Other seasonal allergic rhinitis: Secondary | ICD-10-CM

## 2023-03-24 DIAGNOSIS — E1165 Type 2 diabetes mellitus with hyperglycemia: Secondary | ICD-10-CM | POA: Diagnosis not present

## 2023-03-24 LAB — POCT GLYCOSYLATED HEMOGLOBIN (HGB A1C): HbA1c, POC (controlled diabetic range): 7.4 % — AB (ref 0.0–7.0)

## 2023-03-24 LAB — GLUCOSE, POCT (MANUAL RESULT ENTRY): POC Glucose: 130 mg/dL — AB (ref 70–99)

## 2023-03-24 MED ORDER — FLUTICASONE PROPIONATE 50 MCG/ACT NA SUSP
1.0000 | Freq: Every day | NASAL | 6 refills | Status: AC
  Filled 2023-03-24: qty 16, 34d supply, fill #0

## 2023-03-24 MED ORDER — EMPAGLIFLOZIN 10 MG PO TABS
10.0000 mg | ORAL_TABLET | Freq: Every day | ORAL | 3 refills | Status: DC
  Filled 2023-03-24 – 2023-03-25 (×2): qty 90, 90d supply, fill #0

## 2023-03-24 MED ORDER — IPRATROPIUM-ALBUTEROL 0.5-2.5 (3) MG/3ML IN SOLN
3.0000 mL | Freq: Four times a day (QID) | RESPIRATORY_TRACT | 1 refills | Status: DC | PRN
Start: 2023-03-24 — End: 2023-07-27
  Filled 2023-03-24 – 2023-03-30 (×3): qty 180, 15d supply, fill #0

## 2023-03-24 MED ORDER — OMEPRAZOLE 40 MG PO CPDR
40.0000 mg | DELAYED_RELEASE_CAPSULE | Freq: Every day | ORAL | 0 refills | Status: AC
  Filled 2023-03-24: qty 90, 90d supply, fill #0

## 2023-03-24 MED ORDER — TRUEPLUS PEN NEEDLES 31G X 5 MM MISC: 3 refills | Status: AC

## 2023-03-24 MED ORDER — ENTRESTO 49-51 MG PO TABS
1.0000 | ORAL_TABLET | Freq: Two times a day (BID) | ORAL | 1 refills | Status: DC
  Filled 2023-03-24: qty 180, 90d supply, fill #0

## 2023-03-24 MED ORDER — EASY COMFORT PEN NEEDLES 31G X 5 MM MISC
12 refills | Status: AC
  Filled 2023-03-24: qty 100, 33d supply, fill #0

## 2023-03-24 MED ORDER — TRESIBA FLEXTOUCH 200 UNIT/ML ~~LOC~~ SOPN
42.0000 [IU] | PEN_INJECTOR | Freq: Two times a day (BID) | SUBCUTANEOUS | 5 refills | Status: AC
  Filled 2023-03-24: qty 12, fill #0
  Filled 2023-03-28: qty 12, 29d supply, fill #0

## 2023-03-24 NOTE — Progress Notes (Signed)
Patient ID: Tamara Silva, female   DOB: 24-Mar-1961, 62 y.o.   MRN: [id number]   Sonrisa Berardi, is a 62 y.o. female  [id number]  [id number]  DOB - [birth year]  No chief complaint on file.      Subjective:   Tamara Silva is a 62 y.o. female here today for check in and RF on any needed meds bc she is moving to [facility] November 4th.  She admits to not following diabetic diet lately esp in anticipation of upcoming move.  She reprts compliance with meds.  Seen recently by cardiology and had recent labs.  No new issues or concerns.  She does want to get a flu shot today.    No problems updated.  ALLERGIES: Allergies  Allergen Reactions   Metolazone Other (See Comments)    Dizziness and falling Per patient in 2021, states she does not think she fell because of this medication.   Ibuprofen Other (See Comments)    Pt states she is not supposed to take ibuprofen because of other meds she is taking   Nsaids Other (See Comments)    "I do not take NSAIDs d/t interfering with other meds"    PAST MEDICAL HISTORY: Past Medical History:  Diagnosis Date   AICD (automatic cardioverter/defibrillator) present 04/04/2019   Asthma    CAD (coronary artery disease)    Chronic combined systolic (congestive) and diastolic (congestive) heart failure (HCC)    Chronic iron deficiency anemia    CVA (cerebral vascular accident) (HCC) 05/2018   Diabetes mellitus without complication (HCC)    DOE (dyspnea on exertion)    Edema of both legs    Hepatic steatosis    Hypertension    Pulmonary emboli (HCC) 2014   Respiratory failure (HCC) 09/2018   Seizure (HCC) 05/2018    MEDICATIONS AT HOME: Prior to Admission medications   Medication Sig Start Date End Date Taking? Authorizing Provider  acetaminophen (TYLENOL) 500 MG tablet Take 1,000 mg by mouth every 6 (six) hours as needed for moderate pain or headache.   Yes [provider]  albuterol (PROVENTIL) (2.5 MG/3ML) 0.083% nebulizer  solution Take 3 mLs (2.5 mg total) by nebulization every 6 (six) hours as needed for wheezing or shortness of breath. 07/08/22 07/08/23 Yes Parrett, Virgel Bouquet, NP  apixaban (ELIQUIS) 5 MG TABS tablet Take 1 tablet (5 mg total) by mouth 2 (two) times daily. 12/17/22  Yes Laurey Morale, MD  ascorbic acid (VITAMIN C) 500 MG tablet TAKE ONE TABLET BY MOUTH ONCE DAILY 04/15/20  Yes Jaci Standard, MD  atorvastatin (LIPITOR) 80 MG tablet Take 1 tablet (80 mg total) by mouth daily. 01/03/23  Yes Laurey Morale, MD  BIDIL 20-37.5 MG tablet TAKE 1 TABLET BY MOUTH 3 (THREE) TIMES DAILY. 09/08/22  Yes Laurey Morale, MD  Blood Glucose Monitoring Suppl (ACCU-CHEK GUIDE) w/Device KIT Use accu chek guide to check blood sugar three times daily. 10/09/19  Yes Reather Littler, MD  Continuous Blood Gluc Sensor (FREESTYLE LIBRE 2 SENSOR) MISC USE ONE SENSOR EVERY 14 (FOURTEEN) DAYS. Patient taking differently: USE ONE SENSOR EVERY 14 (FOURTEEN) DAYS.(Libre 3) 07/08/22  Yes Marcine Matar, MD  Continuous Glucose Receiver (FREESTYLE LIBRE 3 READER) DEVI use as directed 8-10 times daily to check blood sugars 01/13/23  Yes Ocie Cornfield, MD  Continuous Glucose Sensor (FREESTYLE LIBRE 2 SENSOR) MISC Use one sensor to monitor blood sugars every 14 days 28 days 10/20/22  Yes Ocie Cornfield, MD  Continuous Glucose  Sensor (FREESTYLE LIBRE 3 SENSOR) MISC Apply a new sensor every 14 days to frequently monitor blood sugar 30 days 01/13/23  Yes   D3-1000 25 MCG (1000 UT) tablet Take 1,000 Units by mouth 2 (two) times daily. 05/07/21  Yes [provider]  ezetimibe (ZETIA) 10 MG tablet Take 1 tablet (10 mg total) by mouth daily. 03/08/23 06/06/23 Yes Laurey Morale, MD  FEROSUL 325 (65 Fe) MG tablet TAKE 1 TABLET (325 MG TOTAL) BY MOUTH DAILY AFTER SUPPER. PLEASE TAKE WITH A SOURCE OF VITAMIN C 05/20/22  Yes Ulysees Barns IV, MD  glucose blood (ACCU-CHEK AVIVA PLUS) test strip USE 1 STRIP TO CHECK BLOOD SUGAR 3 TIMES A DAY . 11/08/22   Yes Marcine Matar, MD  NEEDLE, DISP, 26 G 26G X 1/2" MISC 1 application by Does not apply route daily. 06/22/18  Yes Love, Evlyn Kanner, PA-C  potassium chloride SA (KLOR-CON M) 20 MEQ tablet Take 1 tablet (20 mEq total) by mouth daily. 12/28/22  Yes Laurey Morale, MD  RESTASIS 0.05 % ophthalmic emulsion Place 1 drop into both eyes daily as needed (dry eyes). 12/08/18  Yes [provider]  Semaglutide, 2 MG/DOSE, (OZEMPIC, 2 MG/DOSE,) 8 MG/3ML SOPN Inject 2 mg into the skin every 7 (seven) days. 01/13/23  Yes   sennosides-docusate sodium (SENOKOT-S) 8.6-50 MG tablet Take 1 tablet by mouth 2 (two) times daily.   Yes [provider]  spironolactone (ALDACTONE) 25 MG tablet Take 0.5 tablets (12.5 mg total) by mouth daily. Patient taking differently: Take 25 mg by mouth daily. 12/28/22  Yes Laurey Morale, MD  torsemide [facility]) 100 MG tablet Take torsemide 100 mg every morning and 50 mg every  other day in the evening 03/08/23  Yes Laurey Morale, MD  umeclidinium-vilanterol [facility] ELLIPTA) 62.5-25 MCG/ACT AEPB Inhale 1 puff into the lungs daily. 07/22/22  Yes Parrett, Tammy S, NP  vitamin B-12 (CYANOCOBALAMIN) 1000 MCG tablet Take 1,000 mcg by mouth daily.   Yes [provider]  empagliflozin (JARDIANCE) 10 MG TABS tablet Take 1 tablet (10 mg total) by mouth daily before breakfast. 03/24/23   Sharon Seller, Marzella Schlein, PA-C  fluticasone (FLONASE) 50 MCG/ACT nasal spray Place 1 spray into both nostrils daily. 03/24/23   Anders Simmonds, PA-C  insulin degludec (TRESIBA FLEXTOUCH) 200 UNIT/ML FlexTouch Pen Inject 42 Units into the skin 2 (two) times daily. 03/24/23   Anders Simmonds, PA-C  Insulin Pen Needle (EASY COMFORT PEN NEEDLES) 31G X 5 MM MISC USE 3 TIMES A DAY FOR INSULIN ADMINISTRATION 03/24/23   Georgian Co M, PA-C  Insulin Pen Needle (TRUEPLUS PEN NEEDLES) 31G X 5 MM MISC Use as directed to inject insulin once daily. 03/24/23   Anders Simmonds, PA-C   ipratropium-albuterol (DUONEB) 0.5-2.5 (3) MG/3ML SOLN Take 3 mLs by nebulization every 6 (six) hours as needed.Must have office visit for refills. 03/24/23   Anders Simmonds, PA-C  omeprazole (PRILOSEC) 40 MG capsule Take 1 capsule (40 mg total) by mouth daily. 03/24/23   Anders Simmonds, PA-C  sacubitril-valsartan (ENTRESTO) 49-51 MG Take 1 tablet by mouth 2 (two) times daily. 03/24/23   Montrice Montuori, Marzella Schlein, PA-C    ROS: Neg HEENT Neg resp Neg cardiac Neg GI Neg GU Neg MS Neg psych Neg neuro  Objective:   Vitals:   03/24/23 1416  BP: 126/75  Pulse: 82  SpO2: 96%  Weight: 294 lb 6.4 oz (133.5 kg)   Exam General appearance :  Awake, alert, not in any distress. Speech Clear. Not toxic looking.  On O2 HEENT: Atraumatic and Normocephalic Neck: Supple, no JVD. No cervical lymphadenopathy.  Chest: Good air entry bilaterally, CTAB.  No rales/rhonchi/wheezing CVS: S1 S2 regular, no murmurs.  Extremities: B/L Lower Ext shows no edema, both legs are warm to touch Neurology: Awake alert, and oriented X 3, CN II-XII intact, Non focal Skin: No Rash  Data Review Lab Results  Component Value Date   HGBA1C 7.4 (A) 03/24/2023   HGBA1C 6.6 08/03/2022   HGBA1C 7.3 (H) 11/12/2021    Assessment & Plan   1. Type 2 diabetes mellitus with hyperglycemia, with long-term current use of insulin (HCC) Will divide and increase doseing of tresiba from 80 u once daily to 42 u bid.   - Glucose (CBG) - HgB A1c - empagliflozin (JARDIANCE) 10 MG TABS tablet; Take 1 tablet (10 mg total) by mouth daily before breakfast.  Dispense: 90 tablet; Refill: 3 - insulin degludec (TRESIBA FLEXTOUCH) 200 UNIT/ML FlexTouch Pen; Inject 42 Units into the skin 2 (two) times daily.  Dispense: 12 mL; Refill: 5 - Insulin Pen Needle (EASY COMFORT PEN NEEDLES) 31G X 5 MM MISC; USE 3 TIMES A DAY FOR INSULIN ADMINISTRATION  Dispense: 100 each; Refill: 12 - Insulin Pen Needle (TRUEPLUS PEN NEEDLES) 31G X 5 MM MISC; Use as  directed to inject insulin once daily.  Dispense: 100 each; Refill: 3  2. Long term (current) use of insulin (HCC) Reviewed recent labs - Glucose (CBG) - HgB A1c  3. Long term current use of oral hypoglycemic drug - Glucose (CBG) - HgB A1c  4. Chronic obstructive pulmonary disease, unspecified COPD type (HCC) - ipratropium-albuterol (DUONEB) 0.5-2.5 (3) MG/3ML SOLN; Take 3 mLs by nebulization every 6 (six) hours as needed.Must have office visit for refills.  Dispense: 180 mL; Refill: 1  5. Seasonal allergies - fluticasone (FLONASE) 50 MCG/ACT nasal spray; Place 1 spray into both nostrils daily.  Dispense: 16 g; Refill: 6  6. Essential hypertension controlled - sacubitril-valsartan (ENTRESTO) 49-51 MG; Take 1 tablet by mouth 2 (two) times daily.  Dispense: 180 tablet; Refill: 1  May call for RF for a few months until established in [place] or   Return in about 3 months (around 06/24/2023) for PCP for chronic conditions.  The patient was given clear instructions to go to ER or return to medical center if symptoms don't improve, worsen or new problems develop. The patient verbalized understanding. The patient was told to call to get lab results if they haven't heard anything in the next week.      Georgian Co, PA-C [facility] [place], [place] [phone]   03/24/2023, 2:35 PM

## 2023-03-24 NOTE — Telephone Encounter (Signed)
H&V Care Navigation CSW Progress Note  Clinical Social Worker received call from pt this morning stating she did not have transportation to PCP appt today.  CSW instructed her to CHW to inquire about assistance and CSW messaged RNCM at [facility] as well to inform of need- they will help her arrange to get to appt today.  Pt also reported she is moving to [place] next month- CSW confirmed she would like to cancel MD appt for December- CSW messaged clinic staff to have canceled.  Patient is participating in a Managed Medicaid Plan:  Yes  SDOH Screenings   Food Insecurity: Food Insecurity Present (01/13/2018)  Transportation Needs: Unmet Transportation Needs (03/07/2023)  Utilities: At Risk (01/20/2023)  Depression (PHQ2-9): Medium Risk (08/03/2022)  Financial Resource Strain: High Risk (01/24/2023)  Physical Activity: Inactive (01/13/2018)  Social Connections: Unknown (03/17/2022)   Received from [facility]  Stress: Stress Concern Present (01/13/2018)  Tobacco Use: High Risk (01/18/2023)

## 2023-03-24 NOTE — Progress Notes (Signed)
EPIC Encounter for ICM Monitoring  Patient Name: Tamara Silva is a 62 y.o. female Date: 03/24/2023 Primary Care Physican: Marcine Matar, MD Primary Cardiologist: Shirlee Latch Electrophysiologist: Elberta Fortis 07/30/2022 Office Weight: 318 lbs 12/07/2022 Weight: 315 lbs 02/15/2023 Weight: 328 lbs   Since 14-Feb-2023 Time in AF    0.0 hr/day (0.0%)                                                            Spoke with patient and heart failure questions reviewed.  Transmission results reviewed.  Patient is moving permanently to [place] on 11/4 and will get established with heart care in that area.  She will not participate in the monthly follow up but will continue 91 day monitoring until she transfers to new clinic.     Optivol thoracic impedance suggesting normal fluid levels within the last month.     Prescribed:  Torsemide 100 mg take 1 tablet (100 mg total) by mouth every morning and 0.5 tablet (50 mg total) every evening.  02/15/2023 Pt reports taking it approximately 100 mg twice a week.  Unable to take more often due to severe cramping.  Has discussed with HF clinic on 8/20. Potassium 20 mEq take 1 tablet daily Spironolactone 25 mg take 1 tablet daily Jardiance 10 mg take 1 tablet daily before breakfast   Labs: 01/18/2023 Creatinine 1.44, BUN 23, Potassium 3.5, Sodium 140, GFR 41 12/28/2022 Creatinine 1.18, BUN 17, Potassium 3.8, Sodium 143, GFR 52 09/27/2022 Creatinine 1.59, BUN 27, Potassium 3.8, Sodium 140, GFR 37 09/03/2022 Creatinine 1.49, BUN 33, Potassium 4.2, Sodium 143 08/02/2022 Creatinine 1.58, BUN 30, Potassium 4.3, Sodium 144, GFR 37 05/28/2022 Creatinine 2.03, BUN 33, Potassium 3.8, Sodium 141, GFR 27 A complete set of results can be found in Results Review.   Recommendations:   She will is moving to [place] next week and will establish care.     Follow-up plan:  No further ICM clinic phone appointments.   91 day device clinic remote transmission 04/19/2023.      EP/Cardiology Office Visits:    Recall 03/02/2023 with Dr Elberta Fortis.   Copy of ICM check sent to Dr. Elberta Fortis.    3 month ICM trend: 03/21/2023.    12-14 Month ICM trend:     Karie Soda, RN 03/24/2023 1:37 PM

## 2023-03-24 NOTE — Patient Instructions (Signed)
Please call for Refills while getting established in [place]

## 2023-03-25 ENCOUNTER — Other Ambulatory Visit: Payer: Self-pay

## 2023-03-28 ENCOUNTER — Other Ambulatory Visit: Payer: Self-pay

## 2023-03-30 ENCOUNTER — Other Ambulatory Visit: Payer: Self-pay

## 2023-04-08 ENCOUNTER — Other Ambulatory Visit (HOSPITAL_COMMUNITY): Payer: Self-pay | Admitting: Cardiology

## 2023-04-08 ENCOUNTER — Other Ambulatory Visit: Payer: Self-pay

## 2023-04-08 DIAGNOSIS — I10 Essential (primary) hypertension: Secondary | ICD-10-CM

## 2023-04-08 DIAGNOSIS — I2510 Atherosclerotic heart disease of native coronary artery without angina pectoris: Secondary | ICD-10-CM

## 2023-04-08 DIAGNOSIS — I5022 Chronic systolic (congestive) heart failure: Secondary | ICD-10-CM

## 2023-04-08 DIAGNOSIS — Z794 Long term (current) use of insulin: Secondary | ICD-10-CM

## 2023-04-08 MED ORDER — ENTRESTO 49-51 MG PO TABS: 1.0000 | ORAL_TABLET | Freq: Two times a day (BID) | ORAL | 0 refills | Status: AC

## 2023-04-08 MED ORDER — APIXABAN 5 MG PO TABS: 5.0000 mg | ORAL_TABLET | Freq: Two times a day (BID) | ORAL | 0 refills | Status: AC

## 2023-04-08 MED ORDER — ATORVASTATIN CALCIUM 80 MG PO TABS: 80.0000 mg | ORAL_TABLET | Freq: Every day | ORAL | 0 refills | Status: AC

## 2023-04-08 MED ORDER — BIDIL 20-37.5 MG PO TABS: 1.0000 | ORAL_TABLET | Freq: Three times a day (TID) | ORAL | 0 refills | Status: AC

## 2023-04-08 MED ORDER — TORSEMIDE 100 MG PO TABS: ORAL_TABLET | ORAL | 0 refills | Status: AC

## 2023-04-08 MED ORDER — EMPAGLIFLOZIN 10 MG PO TABS: 10.0000 mg | ORAL_TABLET | Freq: Every day | ORAL | 0 refills | Status: AC

## 2023-04-08 MED ORDER — SPIRONOLACTONE 25 MG PO TABS: 12.5000 mg | ORAL_TABLET | Freq: Every day | ORAL | 0 refills | Status: AC

## 2023-04-08 MED ORDER — POTASSIUM CHLORIDE CRYS ER 20 MEQ PO TBCR: 20.0000 meq | EXTENDED_RELEASE_TABLET | Freq: Every day | ORAL | 0 refills | Status: AC

## 2023-04-08 NOTE — Telephone Encounter (Signed)
Patient called to report she has relocated to [place].   Requests referral/recommendations for cardiologists in the [place] area.    Refilled medications X 90 days-aware she will need to establish care within 90 days.

## 2023-04-11 ENCOUNTER — Other Ambulatory Visit: Payer: Self-pay

## 2023-04-12 ENCOUNTER — Telehealth: Payer: Self-pay

## 2023-04-12 NOTE — Telephone Encounter (Signed)
I do not know anyone in particular in [place], but would advise her to get an appointment with a cardiologist at the [facility] or the [place] [facility]. Whichever hospital is closest to her.

## 2023-04-12 NOTE — Telephone Encounter (Signed)
Returned call to patient as requested by voice mail message.   She has moved to [facility], [place].  She would like for Dr Shirlee Latch to recommend a cardiologist in that are if possible.  Advised will send the request to Dr Alford Highland office for any recommendations.  She is appreciative of the help.

## 2023-04-12 NOTE — Telephone Encounter (Signed)
Pt aware.

## 2023-04-18 ENCOUNTER — Other Ambulatory Visit (HOSPITAL_COMMUNITY): Payer: Self-pay

## 2023-04-19 ENCOUNTER — Ambulatory Visit (INDEPENDENT_AMBULATORY_CARE_PROVIDER_SITE_OTHER): Payer: Self-pay

## 2023-04-19 ENCOUNTER — Telehealth: Payer: Self-pay | Admitting: Pulmonary Disease

## 2023-04-19 DIAGNOSIS — I469 Cardiac arrest, cause unspecified: Secondary | ICD-10-CM

## 2023-04-19 LAB — CUP PACEART REMOTE DEVICE CHECK
Battery Remaining Longevity: 104 mo
Battery Voltage: 3.01 V
Brady Statistic RV Percent Paced: 0.03 %
Date Time Interrogation Session: 20241119033627
HighPow Impedance: 74 Ohm
Implantable Lead Connection Status: 753985
Implantable Lead Implant Date: 20201104
Implantable Lead Location: 753860
Implantable Pulse Generator Implant Date: 20201104
Lead Channel Impedance Value: 266 Ohm
Lead Channel Impedance Value: 342 Ohm
Lead Channel Pacing Threshold Amplitude: 0.5 V
Lead Channel Pacing Threshold Pulse Width: 0.4 ms
Lead Channel Sensing Intrinsic Amplitude: 15.625 mV
Lead Channel Sensing Intrinsic Amplitude: 15.625 mV
Lead Channel Setting Pacing Amplitude: 2.5 V
Lead Channel Setting Pacing Pulse Width: 0.4 ms
Lead Channel Setting Sensing Sensitivity: 0.3 mV
Zone Setting Status: 755011
Zone Setting Status: 755011

## 2023-04-19 NOTE — Telephone Encounter (Signed)
PT needs a referral to a Pulmonologist in [place] now. (No longer the [place] on last signed tel encounter.) Also, she needs all the information needed to get O2 from [place] now too. Please send RX, demographic info and office notes.   [facility]   [phone]  [phone] is the Fax

## 2023-04-26 NOTE — Telephone Encounter (Signed)
[facility] ph: [phone]   Spoke with patient and provided her with information to the above clinic. Patient states she is currently in the hospital "because she ran out of all her medications". Advised her to speak with social worker or hospital staff for assistance with setting up new providers. She voiced understanding. Nothing further needed at this time.

## 2023-05-09 ENCOUNTER — Other Ambulatory Visit (HOSPITAL_COMMUNITY): Payer: Medicaid Other

## 2023-05-09 ENCOUNTER — Other Ambulatory Visit: Payer: Self-pay

## 2023-05-16 ENCOUNTER — Ambulatory Visit: Payer: Medicaid Other | Admitting: Pulmonary Disease

## 2023-05-16 NOTE — Progress Notes (Signed)
Remote ICD transmission.   

## 2023-05-20 ENCOUNTER — Encounter (HOSPITAL_COMMUNITY): Payer: Medicaid Other | Admitting: Cardiology

## 2023-06-08 ENCOUNTER — Other Ambulatory Visit: Payer: Self-pay

## 2023-07-19 ENCOUNTER — Ambulatory Visit (INDEPENDENT_AMBULATORY_CARE_PROVIDER_SITE_OTHER): Payer: Self-pay

## 2023-07-19 DIAGNOSIS — I469 Cardiac arrest, cause unspecified: Secondary | ICD-10-CM | POA: Diagnosis not present

## 2023-07-20 LAB — CUP PACEART REMOTE DEVICE CHECK
Battery Remaining Longevity: 101 mo
Battery Voltage: 3.01 V
Brady Statistic RV Percent Paced: 0.57 %
Date Time Interrogation Session: 20250218043625
HighPow Impedance: 67 Ohm
Implantable Lead Connection Status: 753985
Implantable Lead Implant Date: 20201104
Implantable Lead Location: 753860
Implantable Pulse Generator Implant Date: 20201104
Lead Channel Impedance Value: 266 Ohm
Lead Channel Impedance Value: 323 Ohm
Lead Channel Pacing Threshold Amplitude: 0.5 V
Lead Channel Pacing Threshold Pulse Width: 0.4 ms
Lead Channel Sensing Intrinsic Amplitude: 11.875 mV
Lead Channel Sensing Intrinsic Amplitude: 11.875 mV
Lead Channel Setting Pacing Amplitude: 2.5 V
Lead Channel Setting Pacing Pulse Width: 0.4 ms
Lead Channel Setting Sensing Sensitivity: 0.3 mV
Zone Setting Status: 755011
Zone Setting Status: 755011

## 2023-07-27 ENCOUNTER — Other Ambulatory Visit: Payer: Self-pay | Admitting: Physician Assistant

## 2023-07-27 DIAGNOSIS — J449 Chronic obstructive pulmonary disease, unspecified: Secondary | ICD-10-CM

## 2023-08-25 NOTE — Progress Notes (Signed)
 Remote ICD transmission.

## 2023-08-25 NOTE — Addendum Note (Signed)
 Addended by: Elease Etienne A on: 08/25/2023 02:04 PM   Modules accepted: Orders

## 2023-11-29 ENCOUNTER — Telehealth: Payer: Self-pay

## 2023-11-29 NOTE — Telephone Encounter (Signed)
 Copied from CRM [id number]. Topic: General - Other >> Nov 28, 2023 11:32 AM Shona S wrote: Reason for CRM: patient is calling to ask if we can send over a prescription request for a portable oxygen  machine to [facility] surgical and breathing equipment, phone number is [phone]  Spoke with patient regarding prior message . Patient stated she has moved to [place]  and advised patient she has not been seen in our office since 07/22/2022 and patient would need to come into the office to do a updated walk . Patient stated she will need a referral to [facility] to see a pulmonologist in [place]  . Dr.Olalere can we send in a referral for patient.  Dr.Olalere can you please advise

## 2023-12-12 NOTE — Telephone Encounter (Signed)
 Okay to send referral.

## 2023-12-20 ENCOUNTER — Telehealth: Payer: Self-pay | Admitting: Internal Medicine

## 2023-12-20 NOTE — Telephone Encounter (Signed)
 I called to reschedule husband's AWV. Patient asked me to let Dr. Vicci know she has moved to [place] .

## 2023-12-22 NOTE — Telephone Encounter (Signed)
 I called patient to discuss result of PFT. Unable to reach. Left message to call back.

## 2024-01-04 NOTE — Telephone Encounter (Signed)
 Tamara Silva
